# Patient Record
Sex: Female | Born: 1955 | Hispanic: No | Marital: Single | Smoking: Never smoker
Health system: Southern US, Community
[De-identification: ages and names within clinical notes are randomized; demographics above are authoritative.]

## PROBLEM LIST (undated history)

## (undated) DIAGNOSIS — F32A Depression, unspecified: Secondary | ICD-10-CM

## (undated) DIAGNOSIS — K222 Esophageal obstruction: Secondary | ICD-10-CM

## (undated) DIAGNOSIS — S0990XA Unspecified injury of head, initial encounter: Secondary | ICD-10-CM

## (undated) DIAGNOSIS — T7840XA Allergy, unspecified, initial encounter: Secondary | ICD-10-CM

## (undated) DIAGNOSIS — I2510 Atherosclerotic heart disease of native coronary artery without angina pectoris: Secondary | ICD-10-CM

## (undated) DIAGNOSIS — J449 Chronic obstructive pulmonary disease, unspecified: Secondary | ICD-10-CM

## (undated) DIAGNOSIS — J189 Pneumonia, unspecified organism: Secondary | ICD-10-CM

## (undated) DIAGNOSIS — Z87442 Personal history of urinary calculi: Secondary | ICD-10-CM

## (undated) DIAGNOSIS — I499 Cardiac arrhythmia, unspecified: Secondary | ICD-10-CM

## (undated) DIAGNOSIS — I209 Angina pectoris, unspecified: Secondary | ICD-10-CM

## (undated) DIAGNOSIS — K219 Gastro-esophageal reflux disease without esophagitis: Secondary | ICD-10-CM

## (undated) DIAGNOSIS — M4310 Spondylolisthesis, site unspecified: Secondary | ICD-10-CM

## (undated) DIAGNOSIS — B009 Herpesviral infection, unspecified: Secondary | ICD-10-CM

## (undated) DIAGNOSIS — F329 Major depressive disorder, single episode, unspecified: Secondary | ICD-10-CM

## (undated) DIAGNOSIS — I509 Heart failure, unspecified: Secondary | ICD-10-CM

## (undated) DIAGNOSIS — E785 Hyperlipidemia, unspecified: Secondary | ICD-10-CM

## (undated) DIAGNOSIS — Z9889 Other specified postprocedural states: Secondary | ICD-10-CM

## (undated) DIAGNOSIS — R569 Unspecified convulsions: Secondary | ICD-10-CM

## (undated) DIAGNOSIS — M1990 Unspecified osteoarthritis, unspecified site: Secondary | ICD-10-CM

## (undated) DIAGNOSIS — E119 Type 2 diabetes mellitus without complications: Secondary | ICD-10-CM

## (undated) DIAGNOSIS — F319 Bipolar disorder, unspecified: Secondary | ICD-10-CM

## (undated) DIAGNOSIS — N390 Urinary tract infection, site not specified: Secondary | ICD-10-CM

## (undated) DIAGNOSIS — I10 Essential (primary) hypertension: Secondary | ICD-10-CM

## (undated) DIAGNOSIS — M48061 Spinal stenosis, lumbar region without neurogenic claudication: Secondary | ICD-10-CM

## (undated) DIAGNOSIS — Z8719 Personal history of other diseases of the digestive system: Secondary | ICD-10-CM

## (undated) DIAGNOSIS — Z5189 Encounter for other specified aftercare: Secondary | ICD-10-CM

## (undated) DIAGNOSIS — R0602 Shortness of breath: Secondary | ICD-10-CM

## (undated) DIAGNOSIS — F419 Anxiety disorder, unspecified: Secondary | ICD-10-CM

## (undated) DIAGNOSIS — D649 Anemia, unspecified: Secondary | ICD-10-CM

## (undated) DIAGNOSIS — H269 Unspecified cataract: Secondary | ICD-10-CM

## (undated) DIAGNOSIS — I471 Supraventricular tachycardia, unspecified: Secondary | ICD-10-CM

## (undated) DIAGNOSIS — Z95818 Presence of other cardiac implants and grafts: Secondary | ICD-10-CM

## (undated) DIAGNOSIS — I219 Acute myocardial infarction, unspecified: Secondary | ICD-10-CM

## (undated) DIAGNOSIS — R55 Syncope and collapse: Secondary | ICD-10-CM

## (undated) DIAGNOSIS — R519 Headache, unspecified: Secondary | ICD-10-CM

## (undated) DIAGNOSIS — G4730 Sleep apnea, unspecified: Secondary | ICD-10-CM

## (undated) DIAGNOSIS — M797 Fibromyalgia: Secondary | ICD-10-CM

## (undated) DIAGNOSIS — G4700 Insomnia, unspecified: Secondary | ICD-10-CM

## (undated) HISTORY — DX: Hyperlipidemia, unspecified: E78.5

## (undated) HISTORY — DX: Fibromyalgia: M79.7

## (undated) HISTORY — PX: EYE SURGERY: SHX253

## (undated) HISTORY — DX: Pneumonia, unspecified organism: J18.9

## (undated) HISTORY — DX: Anxiety disorder, unspecified: F41.9

## (undated) HISTORY — DX: Atherosclerotic heart disease of native coronary artery without angina pectoris: I25.10

## (undated) HISTORY — DX: Encounter for other specified aftercare: Z51.89

## (undated) HISTORY — PX: BACK SURGERY: SHX140

## (undated) HISTORY — PX: UPPER GASTROINTESTINAL ENDOSCOPY: SHX188

## (undated) HISTORY — DX: Essential (primary) hypertension: I10

## (undated) HISTORY — PX: HERNIA REPAIR: SHX51

## (undated) HISTORY — DX: Esophageal obstruction: K22.2

## (undated) HISTORY — DX: Unspecified cataract: H26.9

## (undated) HISTORY — DX: Herpesviral infection, unspecified: B00.9

## (undated) HISTORY — DX: Insomnia, unspecified: G47.00

## (undated) HISTORY — DX: Depression, unspecified: F32.A

## (undated) HISTORY — DX: Sleep apnea, unspecified: G47.30

## (undated) HISTORY — DX: Spondylolisthesis, site unspecified: M43.10

## (undated) HISTORY — PX: BREAST SURGERY: SHX581

## (undated) HISTORY — PX: DIAGNOSTIC LAPAROSCOPY: SUR761

## (undated) HISTORY — DX: Unspecified osteoarthritis, unspecified site: M19.90

## (undated) HISTORY — PX: HEMORRHOID SURGERY: SHX153

## (undated) HISTORY — PX: CHOLECYSTECTOMY: SHX55

## (undated) HISTORY — DX: Syncope and collapse: R55

## (undated) HISTORY — PX: TUBAL LIGATION: SHX77

## (undated) HISTORY — DX: Unspecified injury of head, initial encounter: S09.90XA

## (undated) HISTORY — DX: Bipolar disorder, unspecified: F31.9

## (undated) HISTORY — PX: CYSTOSCOPY: SUR368

## (undated) HISTORY — DX: Allergy, unspecified, initial encounter: T78.40XA

## (undated) HISTORY — DX: Major depressive disorder, single episode, unspecified: F32.9

## (undated) HISTORY — DX: Supraventricular tachycardia, unspecified: I47.10

## (undated) HISTORY — DX: Supraventricular tachycardia: I47.1

## (undated) HISTORY — DX: Spinal stenosis, lumbar region without neurogenic claudication: M48.061

## (undated) HISTORY — PX: BREAST EXCISIONAL BIOPSY: SUR124

## (undated) HISTORY — DX: Gastro-esophageal reflux disease without esophagitis: K21.9

---

## 2003-04-29 ENCOUNTER — Emergency Department (HOSPITAL_COMMUNITY): Admission: EM | Admit: 2003-04-29 | Discharge: 2003-04-29 | Payer: Self-pay | Admitting: Emergency Medicine

## 2004-03-26 ENCOUNTER — Other Ambulatory Visit: Admission: RE | Admit: 2004-03-26 | Discharge: 2004-03-26 | Payer: Self-pay | Admitting: Family Medicine

## 2006-02-23 ENCOUNTER — Encounter: Admission: RE | Admit: 2006-02-23 | Discharge: 2006-03-16 | Payer: Self-pay | Admitting: Orthopedic Surgery

## 2007-04-01 HISTORY — PX: DOPPLER ECHOCARDIOGRAPHY: SHX263

## 2007-04-09 ENCOUNTER — Ambulatory Visit: Payer: Self-pay | Admitting: Internal Medicine

## 2007-04-22 ENCOUNTER — Ambulatory Visit: Payer: Self-pay

## 2007-04-22 ENCOUNTER — Encounter: Payer: Self-pay | Admitting: Internal Medicine

## 2007-04-22 LAB — CONVERTED CEMR LAB
Cholesterol: 206 mg/dL (ref 0–200)
Direct LDL: 142.1 mg/dL
HDL: 48.8 mg/dL (ref >39.0)
Triglycerides: 101 mg/dL (ref 0–149)

## 2007-06-15 ENCOUNTER — Ambulatory Visit (HOSPITAL_COMMUNITY): Admission: RE | Admit: 2007-06-15 | Discharge: 2007-06-15 | Payer: Self-pay | Admitting: Internal Medicine

## 2007-06-15 ENCOUNTER — Ambulatory Visit: Payer: Self-pay | Admitting: Internal Medicine

## 2007-06-15 HISTORY — PX: OTHER SURGICAL HISTORY: SHX169

## 2007-07-30 ENCOUNTER — Ambulatory Visit: Payer: Self-pay | Admitting: Internal Medicine

## 2007-07-30 LAB — CONVERTED CEMR LAB
BUN: 13 mg/dL (ref 6–23)
Calcium: 9.1 mg/dL (ref 8.4–10.5)
Chloride: 105 meq/L (ref 96–112)
Creatinine, Ser: 0.8 mg/dL (ref 0.4–1.2)
Eosinophils Absolute: 0.1 10*3/uL (ref 0.0–0.7)
Eosinophils Relative: 2.4 % (ref 0.0–5.0)
GFR calc Af Amer: 98 mL/min
Glucose, Bld: 93 mg/dL (ref 70–99)
MCHC: 33.5 g/dL (ref 30.0–36.0)
MCV: 89 fL (ref 78.0–100.0)
Monocytes Absolute: 0.5 10*3/uL (ref 0.1–1.0)
Monocytes Relative: 8.3 % (ref 3.0–12.0)
Neutro Abs: 2.8 10*3/uL (ref 1.4–7.7)
Potassium: 4.5 meq/L (ref 3.5–5.1)
Prothrombin Time: 11.7 s (ref 10.9–13.3)
RDW: 13.3 % (ref 11.5–14.6)
WBC: 5.5 10*3/uL (ref 4.5–10.5)

## 2007-08-11 ENCOUNTER — Ambulatory Visit (HOSPITAL_COMMUNITY): Admission: RE | Admit: 2007-08-11 | Discharge: 2007-08-11 | Payer: Self-pay | Admitting: Internal Medicine

## 2007-08-11 ENCOUNTER — Ambulatory Visit: Payer: Self-pay | Admitting: Internal Medicine

## 2007-08-11 HISTORY — PX: OTHER SURGICAL HISTORY: SHX169

## 2007-08-26 ENCOUNTER — Ambulatory Visit: Payer: Self-pay

## 2007-09-05 ENCOUNTER — Ambulatory Visit (HOSPITAL_BASED_OUTPATIENT_CLINIC_OR_DEPARTMENT_OTHER): Admission: RE | Admit: 2007-09-05 | Discharge: 2007-09-05 | Payer: Self-pay | Admitting: Neurology

## 2007-09-20 ENCOUNTER — Ambulatory Visit: Payer: Self-pay | Admitting: Pulmonary Disease

## 2007-11-19 ENCOUNTER — Ambulatory Visit: Payer: Self-pay | Admitting: Internal Medicine

## 2008-01-05 ENCOUNTER — Ambulatory Visit: Payer: Self-pay | Admitting: Internal Medicine

## 2008-01-10 ENCOUNTER — Encounter: Admission: RE | Admit: 2008-01-10 | Discharge: 2008-01-10 | Payer: Self-pay | Admitting: Specialist

## 2008-01-20 ENCOUNTER — Encounter: Admission: RE | Admit: 2008-01-20 | Discharge: 2008-03-08 | Payer: Self-pay | Admitting: Specialist

## 2008-05-17 ENCOUNTER — Ambulatory Visit: Payer: Self-pay | Admitting: Internal Medicine

## 2008-05-22 ENCOUNTER — Ambulatory Visit: Payer: Self-pay

## 2008-06-15 ENCOUNTER — Inpatient Hospital Stay (HOSPITAL_COMMUNITY): Admission: RE | Admit: 2008-06-15 | Discharge: 2008-06-19 | Payer: Self-pay | Admitting: Specialist

## 2008-10-31 DIAGNOSIS — F32A Depression, unspecified: Secondary | ICD-10-CM | POA: Insufficient documentation

## 2008-10-31 DIAGNOSIS — M48061 Spinal stenosis, lumbar region without neurogenic claudication: Secondary | ICD-10-CM | POA: Insufficient documentation

## 2008-10-31 DIAGNOSIS — S0990XA Unspecified injury of head, initial encounter: Secondary | ICD-10-CM | POA: Insufficient documentation

## 2008-10-31 DIAGNOSIS — I10 Essential (primary) hypertension: Secondary | ICD-10-CM | POA: Insufficient documentation

## 2008-10-31 DIAGNOSIS — F329 Major depressive disorder, single episode, unspecified: Secondary | ICD-10-CM

## 2008-10-31 DIAGNOSIS — B009 Herpesviral infection, unspecified: Secondary | ICD-10-CM | POA: Insufficient documentation

## 2008-10-31 DIAGNOSIS — E785 Hyperlipidemia, unspecified: Secondary | ICD-10-CM | POA: Insufficient documentation

## 2008-10-31 DIAGNOSIS — M791 Myalgia, unspecified site: Secondary | ICD-10-CM | POA: Insufficient documentation

## 2008-10-31 DIAGNOSIS — M810 Age-related osteoporosis without current pathological fracture: Secondary | ICD-10-CM | POA: Insufficient documentation

## 2008-10-31 DIAGNOSIS — G4730 Sleep apnea, unspecified: Secondary | ICD-10-CM | POA: Insufficient documentation

## 2008-10-31 DIAGNOSIS — K219 Gastro-esophageal reflux disease without esophagitis: Secondary | ICD-10-CM | POA: Insufficient documentation

## 2008-10-31 DIAGNOSIS — Q762 Congenital spondylolisthesis: Secondary | ICD-10-CM | POA: Insufficient documentation

## 2008-10-31 DIAGNOSIS — J45909 Unspecified asthma, uncomplicated: Secondary | ICD-10-CM | POA: Insufficient documentation

## 2008-10-31 DIAGNOSIS — R55 Syncope and collapse: Secondary | ICD-10-CM | POA: Insufficient documentation

## 2008-10-31 DIAGNOSIS — J189 Pneumonia, unspecified organism: Secondary | ICD-10-CM | POA: Insufficient documentation

## 2008-11-09 ENCOUNTER — Ambulatory Visit: Payer: Self-pay | Admitting: Internal Medicine

## 2009-02-12 ENCOUNTER — Ambulatory Visit: Payer: Self-pay

## 2009-05-03 ENCOUNTER — Encounter: Admission: RE | Admit: 2009-05-03 | Discharge: 2009-08-01 | Payer: Self-pay | Admitting: Specialist

## 2009-05-15 ENCOUNTER — Ambulatory Visit: Payer: Self-pay | Admitting: Internal Medicine

## 2009-06-04 ENCOUNTER — Encounter: Admission: RE | Admit: 2009-06-04 | Discharge: 2009-06-04 | Payer: Self-pay | Admitting: Specialist

## 2009-07-11 ENCOUNTER — Telehealth (INDEPENDENT_AMBULATORY_CARE_PROVIDER_SITE_OTHER): Payer: Self-pay | Admitting: *Deleted

## 2009-07-23 ENCOUNTER — Telehealth (INDEPENDENT_AMBULATORY_CARE_PROVIDER_SITE_OTHER): Payer: Self-pay | Admitting: *Deleted

## 2009-08-07 ENCOUNTER — Encounter: Admission: RE | Admit: 2009-08-07 | Discharge: 2009-11-05 | Payer: Self-pay | Admitting: Specialist

## 2009-11-06 ENCOUNTER — Ambulatory Visit: Payer: Self-pay | Admitting: Internal Medicine

## 2010-03-06 ENCOUNTER — Encounter
Admission: RE | Admit: 2010-03-06 | Discharge: 2010-03-06 | Payer: Self-pay | Source: Home / Self Care | Admitting: Specialist

## 2010-04-21 ENCOUNTER — Encounter: Payer: Self-pay | Admitting: Family Medicine

## 2010-04-30 NOTE — Progress Notes (Signed)
Summary: Records request from [facility]  Request for records received from [facility]. Request forwarded to [facility]. Dena Chavis  July 23, 2009 11:13 AM

## 2010-04-30 NOTE — Assessment & Plan Note (Signed)
Summary: device/saf  Medications Added TRAZODONE HCL 100 MG TABS (TRAZODONE HCL) 2 tabs once daily LIPITOR 40 MG TABS (ATORVASTATIN CALCIUM) Take one tablet by mouth daily.      Allergies Added:   Visit Type:  Follow-up Primary Provider:  Dr. Lindaann Pascal   History of Present Illness: Ms. Nuccio returns today for followup.   She has a h/o unexplained syncope and is s/p ILR insertion.  The patient denies c/p but does note some increased stress as she is in the process of moving.  She had one syncopal episode since we last saw her.  The patient has not had any sob. No peripheral edema.  Current Medications (verified): 1)  Omeprazole 40 Mg Cpdr (Omeprazole) .... Two Times A Day 2)  Atenolol 25 Mg Tabs (Atenolol) .... Take One Tablet By Mouth Two Times A Day and As Directed For Palpations 3)  Cymbalta 60 Mg Cpep (Duloxetine Hcl) .... Once Daily 4)  Voltaren 1 % Gel (Diclofenac Sodium) .... As Needed 5)  Nitrolingual 0.4 Mg/spray Soln (Nitroglycerin) .... Prn 6)  Proair Hfa 108 (90 Base) Mcg/act Aers (Albuterol Sulfate) .... As Needed 7)  Xanax 1 Mg Tabs (Alprazolam) .... Two Times A Day 8)  Abilify 10 Mg Tabs (Aripiprazole) .... Once Daily 9)  Hydrochlorothiazide 12.5 Mg Tabs (Hydrochlorothiazide) .... Once Daily 10)  Valtrex 500 Mg Tabs (Valacyclovir Hcl) .... Two Times A Day 11)  Trazodone Hcl 100 Mg Tabs (Trazodone Hcl) .... 2 Tabs Once Daily 12)  Zovirax 5 % Oint (Acyclovir) .... Prn 13)  Lipitor 40 Mg Tabs (Atorvastatin Calcium) .... Take One Tablet By Mouth Daily.  Allergies (verified): 1)  ! 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 of  SUPRAVENTRICULAR TACHYCARDIA (ICD-427.89) SYNCOPE (ICD-780.2) CAD (ICD-414.00) SPONDYLOLISTHESIS (ICD-756.12) SPINAL STENOSIS OF LUMBAR REGION (ICD-724.02)  Past Surgical History: Last updated: 10/31/2008 Insertion of implantable loop recorder  08/11/2007  Doylene Canning. Ladona Ridgel, MD     Head-up tilt table testing.   06/15/2007  Doylene Canning. Ladona Ridgel, MD   Back Surgery  Review of Systems       The patient complains of weight gain and syncope.  The patient denies chest pain, dyspnea on exertion, and peripheral edema.    Vital Signs:  Patient profile:   55 year old female Height:      61 inches Weight:      144 pounds Pulse rate:   64 / minute BP sitting:   118 / 80  (left arm)  Vitals Entered By: Laurance Flatten CMA (November 06, 2009 2:30 PM)  Physical Exam  General:  Well developed, well nourished, in no acute distress. Head:  normocephalic and atraumatic Eyes:  PERRLA/EOM intact; conjunctiva and lids normal. Mouth:  Teeth, gums and palate normal. Oral mucosa normal. Neck:  Neck supple, no JVD. No masses, thyromegaly or abnormal cervical nodes. Chest Wall:  Well healed ILR incision. Lungs:  Clear bilaterally to auscultation. No wheezes, rales, or rhonchi. Heart:  RRR with normal S1 and S2.  PMI is not enlarged or laterally displaced. Abdomen:  Bowel sounds positive; abdomen soft and non-tender without masses, organomegaly, or hernias noted. No hepatosplenomegaly. Msk:  Back normal, normal gait. Muscle strength and tone normal. Pulses:  pulses normal in all 4 extremities Extremities:  No clubbing or cyanosis. Neurologic:  Alert and oriented x 3.    ILR Following MD [name], MD DOI:  08/11/2007 Vendor:  [employer]     Model Number:  [id number]     Serial Number [id number]       Tachy Episodes:  12     Brady Episodes:  9 ILR Next Due 02/05/2010  Tech Comments:  All tachy episodes T wave oversensing, brady/asystole episodes undersensing.   ROV 3 months clinic. Altha Harm, LPN  November 06, 2009 2:46 PM   MD Comments:  Agree with above.  Impression & Recommendations:  Problem # 1:  SYNCOPE (ICD-780.2) Her symptoms appear to be well controlled.  She has one episode of syncope not associated to bracycardia or tachycardia.  Will follow her ILR. Her updated medication list for this problem includes:    Atenolol 25 Mg Tabs (Atenolol) .[name]... Take one tablet by mouth two times a day and as directed for palpations    Nitrolingual 0.4 Mg/spray Soln (Nitroglycerin) .[name]... Prn  Problem # 2:  HYPERTENSION (ICD-401.9) She will continue her current meds.  A low sodium diet is requested. The following medications were removed from the medication list:    Caduet 5-40 Mg Tabs (Amlodipine-atorvastatin) ..... Once daily Her updated medication list for this problem includes:    Atenolol 25 Mg Tabs (Atenolol) .[name]... Take one tablet by mouth two times a day and as directed for palpations    Hydrochlorothiazide 12.5 Mg Tabs (Hydrochlorothiazide) ..... Once daily  Patient Instructions: 1)  Your physician recommends that you schedule a follow-up appointment in: 6 months with device clinic and 12 months with Dr Ladona Ridgel

## 2010-04-30 NOTE — Progress Notes (Signed)
   Request recieved from [employer] sent to [facility]  July 11, 2009 9:56 AM

## 2010-04-30 NOTE — Assessment & Plan Note (Signed)
Summary: pc2  Medications Added OMEPRAZOLE 40 MG CPDR (OMEPRAZOLE) two times a day ATENOLOL 25 MG TABS (ATENOLOL) Take one tablet by mouth two times a day ATENOLOL 25 MG TABS (ATENOLOL) Take one tablet by mouth two times a day and as directed for palpations CYMBALTA 60 MG CPEP (DULOXETINE HCL) once daily VALTREX 500 MG TABS (VALACYCLOVIR HCL) two times a day ETODOLAC 400 MG TABS (ETODOLAC) two times a day PENTAZOCINE-APAP 25-650 MG TABS (PENTAZOCINE-APAP) every 6 hrs      Allergies Added:   Primary Provider:  Dr. Lorin Picket Long  CC:  2/5 had episode -- felt like heart was gonna "explode" .  History of Present Illness: [name] returns today for followup.   She has a h/o unexplained syncope and is s/p ILR insertion.  The patient denies c/p but does note that she had an episode while at the movies where it felt like her heart would beat out of her chest.  The patient has not had any syncope or sob. No peripheral edema.  Current Medications (verified): 1)  Omeprazole 40 Mg Cpdr (Omeprazole) .... Two Times A Day 2)  Atenolol 25 Mg Tabs (Atenolol) .... Take One Tablet By Mouth Two Times A Day 3)  Cymbalta 60 Mg Cpep (Duloxetine Hcl) .... Once Daily 4)  Voltaren 1 % Gel (Diclofenac Sodium) .... As Needed 5)  Nitrolingual 0.4 Mg/spray Soln (Nitroglycerin) .... Prn 6)  Proair Hfa 108 (90 Base) Mcg/act Aers (Albuterol Sulfate) .... As Needed 7)  Xanax 1 Mg Tabs (Alprazolam) .... Two Times A Day 8)  Abilify 10 Mg Tabs (Aripiprazole) .... Once Daily 9)  Hydrochlorothiazide 12.5 Mg Tabs (Hydrochlorothiazide) .... Once Daily 10)  Valtrex 500 Mg Tabs (Valacyclovir Hcl) .... Two Times A Day 11)  Caduet 5-40 Mg Tabs (Amlodipine-Atorvastatin) .... Once Daily 12)  Zetia 10 Mg Tabs (Ezetimibe) .... Qd 13)  Trazodone Hcl 50 Mg Tabs (Trazodone Hcl) .... Once Daily 14)  Zovirax 5 % Oint (Acyclovir) .... Prn 15)  Baclofen 10 Mg Tabs (Baclofen) .... Three Times A Day 16)  Etodolac 400 Mg Tabs (Etodolac)  .... Two Times A Day 17)  Pentazocine-Apap 25-650 Mg Tabs (Pentazocine-Apap) .... Every 6 Hrs  Allergies (verified): 1)  ! 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 of SUPRAVENTRICULAR TACHYCARDIA (ICD-427.89) SYNCOPE (ICD-780.2) CAD (ICD-414.00) SPONDYLOLISTHESIS (ICD-756.12) SPINAL STENOSIS OF LUMBAR REGION (ICD-724.02)  Past Surgical History: Last updated: 10/31/2008 Insertion of implantable loop recorder  08/11/2007  [name]. Erica Ridgel, MD     Head-up tilt table testing.   06/15/2007  [name]. Erica Ridgel, MD   Back Surgery  Review of Systems       The patient complains of chest pain.  The patient denies syncope, dyspnea on exertion, and peripheral edema.    Vital Signs:  Patient profile:   55 year old female Height:      61 inches Weight:      136 pounds BMI:     25.79 Pulse rate:   73 / minute BP sitting:   130 / 83  (left arm) Cuff size:   regular  Vitals Entered By: Hardin Negus, RMA (May 15, 2009 2:49 PM)  Physical Exam  General:  Well developed, well nourished, in no acute distress. Head:  normocephalic and atraumatic Eyes:  PERRLA/EOM intact; conjunctiva  and lids normal. Mouth:  Teeth, gums and palate normal. Oral mucosa normal. Neck:  Neck supple, no JVD. No masses, thyromegaly or abnormal cervical nodes. Chest Wall:  Well healed ILR incision. Lungs:  Clear bilaterally to auscultation. No wheezes, rales, or rhonchi. Heart:  RRR with normal S1 and S2.  PMI is not enlarged or laterally displaced. Abdomen:  Bowel sounds positive; abdomen soft and non-tender without masses, organomegaly, or hernias noted. No  hepatosplenomegaly. Msk:  Back normal, normal gait. Muscle strength and tone normal. Pulses:  pulses normal in all 4 extremities Extremities:  No clubbing or cyanosis. Neurologic:  Alert and oriented x 3.    ILR Following MD [name], MD DOI:  08/11/2007 Vendor:  [employer]     Model Number:  [id number]     Serial Number [id number]       Tachy Episodes:  3      Tech Comments:  FVT and VT episodes sinus tachy (3), all other oversensing episodes.  0 asystole episodes.  Checked by [employer]. Altha Harm, LPN  May 15, 2009 3:25 PM   MD Comments:  Agree with above.  Impression & Recommendations:  Problem # 1:  SYNCOPE (ICD-780.2) No recurrent episodes.  Will followup. The following medications were removed from the medication list:    Corgard 20 Mg Tabs (Nadolol) Her updated medication list for this problem includes:    Atenolol 25 Mg Tabs (Atenolol) .[name]... Take one tablet by mouth two times a day and as directed for palpations    Nitrolingual 0.4 Mg/spray Soln (Nitroglycerin) .[name]... Prn  Problem # 2:  ? of SUPRAVENTRICULAR TACHYCARDIA (ICD-427.89) The patient has had recurrent palpitations. The etiology is unclear. I have recommended that she take an additional beta blocker for recurrent symptoms and activate her ILR. The following medications were removed from the medication list:    Corgard 20 Mg Tabs (Nadolol) Her updated medication list for this problem includes:    Atenolol 25 Mg Tabs (Atenolol) .[name]... Take one tablet by mouth two times a day and as directed for palpations    Nitrolingual 0.4 Mg/spray Soln (Nitroglycerin) .[name]... Prn  Problem # 3:  HYPERTENSION (ICD-401.9) continue current meds and a low sodium diet. The following medications were removed from the medication list:    Corgard 20 Mg Tabs (Nadolol) Her updated medication list for this problem includes:    Atenolol 25 Mg Tabs (Atenolol) .[name]... Take one tablet by mouth two times a day and as directed for  palpations    Hydrochlorothiazide 12.5 Mg Tabs (Hydrochlorothiazide) ..... Once daily    Caduet 5-40 Mg Tabs (Amlodipine-atorvastatin) ..... Once daily  Patient Instructions: 1)  Your physician recommends that you schedule a follow-up appointment in: 6 months with Dr [name] Prescriptions: ATENOLOL 25 MG TABS (ATENOLOL) Take one tablet by mouth two times a day and as directed for palpations  #60 x 11   Entered by:   Dennis Bast, RN, BSN   Authorized by:   Laren Boom, MD, [facility]   Signed by:   Dennis Bast, RN, BSN on 05/15/2009   Method used:   Electronically to        [facility]* (retail)       [address]       [place], [place]  [address]       Ph: [phone]       Fax: [phone]   RxID:   [id number]

## 2010-05-01 ENCOUNTER — Ambulatory Visit: Admit: 2010-05-01 | Payer: Self-pay | Admitting: Internal Medicine

## 2010-05-13 ENCOUNTER — Encounter: Payer: Self-pay | Admitting: Internal Medicine

## 2010-05-13 ENCOUNTER — Encounter (INDEPENDENT_AMBULATORY_CARE_PROVIDER_SITE_OTHER): Payer: Medicare Other

## 2010-05-13 DIAGNOSIS — R55 Syncope and collapse: Secondary | ICD-10-CM

## 2010-05-22 NOTE — Procedures (Signed)
Summary: device/sf/glc  Medications Added XANAX 1 MG TABS (ALPRAZOLAM) one by mouth three times a day      Allergies Added:   Current Medications (verified): 1)  Omeprazole 40 Mg Cpdr (Omeprazole) .... Two Times A Day 2)  Atenolol 25 Mg Tabs (Atenolol) .... Take One Tablet By Mouth Two Times A Day and As Directed For Palpations 3)  Cymbalta 60 Mg Cpep (Duloxetine Hcl) .... Once Daily 4)  Voltaren 1 % Gel (Diclofenac Sodium) .... As Needed 5)  Nitrolingual 0.4 Mg/spray Soln (Nitroglycerin) .... Prn 6)  Proair Hfa 108 (90 Base) Mcg/act Aers (Albuterol Sulfate) .... As Needed 7)  Xanax 1 Mg Tabs (Alprazolam) .... One By Mouth Three Times A Day 8)  Abilify 10 Mg Tabs (Aripiprazole) .... Once Daily 9)  Hydrochlorothiazide 12.5 Mg Tabs (Hydrochlorothiazide) .... Once Daily 10)  Valtrex 500 Mg Tabs (Valacyclovir Hcl) .... Two Times A Day 11)  Trazodone Hcl 100 Mg Tabs (Trazodone Hcl) .... 2 Tabs Once Daily 12)  Zovirax 5 % Oint (Acyclovir) .... Prn 13)  Lipitor 40 Mg Tabs (Atorvastatin Calcium) .... Take One Tablet By Mouth Daily.  Allergies (verified): 1)  ! Darlyne Russian   ILR Following MD [name], MD DOI:  08/11/2007 Vendor:  [employer]     Model Number:  1610     Serial Number [id number]       Tachy Episodes:  42      Tech Comments:  7 asystole episodes were undersensing. 2 episodes noted on 1/28 while the patient was walking she states" her lips and fingernails were blue."  Episodes show ST/SVT rate 170bpm.  Patient was instructed to make note of any further episodes and if they are proloned to go to the ER per Dr. Ladona Ridgel.  Otherwise we will see her back in 3 months.  All other episodes were T-wave oversensing. Altha Harm, LPN  May 13, 2010 10:10 AM

## 2010-07-11 LAB — COMPREHENSIVE METABOLIC PANEL
AST: 25 U/L (ref 0–37)
Albumin: 4.1 g/dL (ref 3.5–5.2)
BUN: 15 mg/dL (ref 6–23)
Chloride: 107 mEq/L (ref 96–112)
Creatinine, Ser: 0.89 mg/dL (ref 0.4–1.2)
GFR calc Af Amer: >60 mL/min (ref >60)
Potassium: 3.8 mEq/L (ref 3.5–5.1)
Total Protein: 6.8 g/dL (ref 6.0–8.3)

## 2010-07-11 LAB — CULTURE, BLOOD (ROUTINE X 2)

## 2010-07-11 LAB — DIFFERENTIAL
Basophils Absolute: 0 10*3/uL (ref 0.0–0.1)
Basophils Relative: 0 % (ref 0–1)
Eosinophils Absolute: 0.1 10*3/uL (ref 0.0–0.7)
Eosinophils Relative: 2 % (ref 0–5)
Lymphocytes Relative: 42 % (ref 12–46)
Lymphs Abs: 0.9 10*3/uL (ref 0.7–4.0)
Monocytes Absolute: 0.5 10*3/uL (ref 0.1–1.0)
Monocytes Relative: 7 % (ref 3–12)
Neutro Abs: 6.8 10*3/uL (ref 1.7–7.7)
Neutro Abs: 3.2 10*3/uL (ref 1.7–7.7)
Neutrophils Relative %: 79 % — ABNORMAL HIGH (ref 43–77)

## 2010-07-11 LAB — BASIC METABOLIC PANEL
BUN: 5 mg/dL — ABNORMAL LOW (ref 6–23)
BUN: 8 mg/dL (ref 6–23)
CO2: 28 mEq/L (ref 19–32)
CO2: 31 mEq/L (ref 19–32)
Calcium: 8.3 mg/dL — ABNORMAL LOW (ref 8.4–10.5)
Chloride: 102 mEq/L (ref 96–112)
Chloride: 103 mEq/L (ref 96–112)
Chloride: 104 mEq/L (ref 96–112)
Creatinine, Ser: 0.77 mg/dL (ref 0.4–1.2)
GFR calc Af Amer: >60 mL/min (ref >60)
GFR calc non Af Amer: >60 mL/min (ref >60)
GFR calc non Af Amer: >60 mL/min (ref >60)
Glucose, Bld: 92 mg/dL (ref 70–99)
Potassium: 3.9 mEq/L (ref 3.5–5.1)
Potassium: 4.2 mEq/L (ref 3.5–5.1)
Sodium: 136 mEq/L (ref 135–145)

## 2010-07-11 LAB — CBC
HCT: 28.4 % — ABNORMAL LOW (ref 36.0–46.0)
HCT: 44.7 % (ref 36.0–46.0)
MCHC: 34.5 g/dL (ref 30.0–36.0)
MCHC: 34.7 g/dL (ref 30.0–36.0)
MCHC: 34.7 g/dL (ref 30.0–36.0)
MCV: 91.3 fL (ref 78.0–100.0)
MCV: 92.3 fL (ref 78.0–100.0)
Platelets: 105 10*3/uL — ABNORMAL LOW (ref 150–400)
Platelets: 151 10*3/uL (ref 150–400)
Platelets: 95 10*3/uL — ABNORMAL LOW (ref 150–400)
Platelets: 200 10*3/uL (ref 150–400)
RBC: 2.86 MIL/uL — ABNORMAL LOW (ref 3.87–5.11)
RDW: 12.2 % (ref 11.5–15.5)
RDW: 12.4 % (ref 11.5–15.5)
RDW: 12.6 % (ref 11.5–15.5)
WBC: 5.5 10*3/uL (ref 4.0–10.5)
WBC: 6.4 10*3/uL (ref 4.0–10.5)
WBC: 6.5 10*3/uL (ref 4.0–10.5)

## 2010-07-11 LAB — URINALYSIS, ROUTINE W REFLEX MICROSCOPIC
Glucose, UA: NEGATIVE mg/dL
Nitrite: NEGATIVE
Specific Gravity, Urine: 1.024 (ref 1.005–1.030)
pH: 5 (ref 5.0–8.0)

## 2010-07-11 LAB — TYPE AND SCREEN
ABO/RH(D): A POS
Antibody Screen: NEGATIVE

## 2010-07-11 LAB — HEMOGLOBIN AND HEMATOCRIT, BLOOD
HCT: 30.5 % — ABNORMAL LOW (ref 36.0–46.0)
Hemoglobin: 10.5 g/dL — ABNORMAL LOW (ref 12.0–15.0)
Hemoglobin: 10.6 g/dL — ABNORMAL LOW (ref 12.0–15.0)

## 2010-07-11 LAB — APTT: aPTT: 28 seconds (ref 24–37)

## 2010-08-12 ENCOUNTER — Ambulatory Visit (INDEPENDENT_AMBULATORY_CARE_PROVIDER_SITE_OTHER): Payer: Medicare Other | Admitting: *Deleted

## 2010-08-12 DIAGNOSIS — R55 Syncope and collapse: Secondary | ICD-10-CM

## 2010-08-13 NOTE — Assessment & Plan Note (Signed)
[facility]                         ELECTROPHYSIOLOGY OFFICE NOTE   NAME:RICHEYBijal, Siglin            MRN:          [id number]  DATE:05/17/2008                            DOB:          September 21, 1955    Ms. Desmith returns today for followup.  She is a very pleasant middle-  aged woman with a history of syncope and sinus tachycardia.  She is  status post insertion of implantable loop recorder.  She has a history  of severe arthritis and lower back pain.  She has palpitations which are  fairly quiet.  The patient states today that she is considering the  upcoming back surgery for a problem with her lumbar disk.  She states  that in the last several months she has had experienced episodes of  chest discomfort, sometimes with exertion, sometimes rest.  Sometimes  her symptoms involve her left arm.  There was no associated nausea or  vomiting.  She is, otherwise, stable.   MEDICATIONS:  1. Zegerid 40/1100 b.i.d.  2. Atenolol 25 a day.  3. Cymbalta 60 a day.  4. Voltaren gel.  5. Kapidex 60 a day.  6. __________ 375 twice daily.   PHYSICAL EXAMINATION:  GENERAL:  She is a pleasant middle-aged woman in  no distress.  VITAL SIGNS:  Blood pressure was 123/89, pulse 73 and regular,  respirations were 18, weight was 140 pounds.  NECK:  No jugular distention.  LUNGS:  Clear bilaterally on auscultation.  No wheezes, rales, or  rhonchi are present.  CARDIAC:  Regular rate and rhythm.  Normal S1 and S2.  ABDOMEN:  Soft and nontender.  EXTREMITIES:  No edema.   Interrogation of her implantable loop recorder demonstrates episodes of  what appear to be sinus tachycardia, though I cannot exclude atrial tach  or SVT.  There is also some oversensing present.   IMPRESSION:  1. History of syncope with no obvious etiology.  2. Status post loop recorder with documented sinus tachycardia,      persistent atrial tachycardia which appears to be asymptomatic  and      does not correlate with her spells.  3. Severe lumbar back pain.  4. Chest pain, both typical and atypical in its nature.   DISCUSSION:  The patient is strongly considering undergoing lumbar back  surgery.  Because of her chest pain, I think it is important that we  evaluate her for occult ischemia.  Normally, I would recommend a regular  exercise treadmill test to evaluate this.  The patient tells me that she  is unable to walk any significant distance before her back and leg start  to hurt, and she begins to feel numb in her feet and does not think she  is able to walk.  For this reason, we will schedule her for an adenosine  Myoview study and if this is okay, she would be allowed to proceed with  back surgery.     Doylene Canning. Ladona Ridgel, MD  Electronically Signed    GWT/MedQ  DD: 05/17/2008  DT: 05/18/2008  Job #: [id number]   cc:   Kerrin Champagne, M.D.

## 2010-08-13 NOTE — Assessment & Plan Note (Signed)
[facility]                         ELECTROPHYSIOLOGY OFFICE NOTE   NAME:[name], [name]            MRN:          [id number]  DATE:11/19/2007                            DOB:          [birth year]    HISTORY OF PRESENT ILLNESS:  This is a middle-aged woman with a history  of unexplained syncope, status post insertion of the left implantable  loop recorder who recently underwent sleep study after her loop recorder  was placed.  The patient did not have any evidence of sleep apnea by her  report.  She has had palpitations and also couple of syncopal episodes  since her loop recorder was placed.  She returns today for followup.  She denies chest pain.  She denies shortness of breath.  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   PHYSICAL EXAMINATION:  GENERAL:  On exam today, she is a pleasant well-  appearing woman, in no distress.  VITAL SIGNS:  Blood pressure was 139/85, pulse 60 and regular,  respirations 18, the weight was 156 pounds.  NECK:  Revealed no jugular venous distention.  LUNGS:  Clear bilaterally to auscultation.  No wheezes, rales, or  rhonchi are present.  CARDIOVASCULAR:  Regular rate and rhythm.  Normal S1 and S2.  EXTREMITIES:  Demonstrate no edema.  Her loop recorder insertion site  was healed nicely.   Interrogation of her loop recorder demonstrates episodes of tachycardia  at rates up to 170 beats per minute.  I cannot discern whether this is  sinus tach or SVT.  I have recommend that we give her some atenolol 25-  50 mg a day.  I will see her back in 2 months to see how her medications  are controlling her rapid heart rates.     Doylene Canning. Ladona Ridgel, MD  Electronically Signed    GWT/MedQ  DD: 11/19/2007  DT: 11/19/2007  Job #: [id number]

## 2010-08-13 NOTE — Consult Note (Signed)
[name], [name]             ACCOUNT NO.:  [id number]   MEDICAL RECORD NO.:  [id number]          PATIENT TYPE:  INP   LOCATION:  5034                         FACILITY:  [facility]   PHYSICIAN:  [name], M.D. DATE OF BIRTH:  [birth year]   DATE OF CONSULTATION:  06/17/2008  DATE OF DISCHARGE:                                 CONSULTATION   REQUESTING PHYSICIANS:  Dr. August Saucer and Dr. Otelia Sergeant.   REASON FOR CONSULTATION:  Altered mental status.   HISTORY OF PRESENT ILLNESS:  This is a 55 year old female who was  admitted to the hospital on June 15, 2008 for surgery.  The patient was  admitted by the orthopedic service, Dr. Otelia Sergeant, for a lumbar surgery  which was performed on June 15, 2008 where she underwent a  decompression and bilateral foraminotomy of the L4 and L5 disk areas.  The patient was recovering well until the morning and afternoon of June 17, 2008, where she was lethargic and sluggish and unable to participate  with her physical therapy and occupational therapy regimens which had  been ordered.  The patient had been further evaluated and had received 4  mg of Dilaudid orally in the a.m. and became very sluggish thereafter.  The patient was administered Narcan IV times one dose and had slight  improvement but continued to improve.  The patient did not have loss of  responsiveness.  She did have mild hypotension.  At the time of my  interview with the patient, the patient was alert and oriented.  She did  not recall what had happened in the earlier day.  She denies having any  headache or chest pain or shortness of breath.  The patient has not  received further pain medications since the a.m.   PAST MEDICAL HISTORY:  1. Coronary artery disease.  2. Hypertension.  3. Dyslipidemia.  4. Depression.  5. HSV-2.  6. History of syncope.  7. Hypersomnia.   MEDICATIONS:  At this time, secondary to syncopal events, include:  Clonazepam, trazodone, Abilify, albuterol,  Protonix, valacyclovir,  Zetia, hydrochlorothiazide, diclofenac, Crestor, amlodipine, Trinsicon,  and post surgically, the patient has Dilaudid, IV Robaxin.   ALLERGIES:  CODEINE AND MORPHINE.   SOCIAL HISTORY:  The patient is a former smoker.  She is a nondrinker.   PHYSICAL EXAMINATION FINDINGS:  GENERAL:  This is a 55 year old  overweight female, currently in mild discomfort but no acute distress.  VITAL SIGNS:  Temperature 97.1, blood pressure 99/69, heart rate 82,  respirations 18, O2 sats 98%.  HEENT:  Normocephalic, atraumatic.  Pupils equally round, reactive to  light.  Extraocular movements are intact.  Funduscopic benign.  Oropharynx is clear.  NECK:  Supple, full range of motion.  No thyromegaly, adenopathy,  jugular venous distention.  CARDIOVASCULAR:  Regular rate and rhythm.  No murmurs, gallops or rubs.  LUNGS:  Clear to auscultation bilaterally.  ABDOMEN:  Positive bowel sounds, soft, nontender, nondistended.  EXTREMITIES:  Without cyanosis, clubbing or edema.  NEUROLOGICAL:  The patient is alert and oriented x3.  There are no focal  deficits on examination.   LABORATORY DATA:  The patient's medications have been reviewed and her  laboratory studies have also been reviewed.  A chest x-ray was performed  which revealed decreased lung volumes and a questionable right lower  lobe infiltrate which is very poorly visualized.   ASSESSMENT:  A 55 year old female with episode of unresponsiveness  linked to administration of Dilaudid therapy.  The patient is improving  at this point and continues to improve.   PLAN:  Plan has been to decrease the overall dosage of Dilaudid which  has been decreased from 4 mg orally q.4 h to 1 mg orally q.4 h p.r.n.  The patient has been encouraged to utilize the incentive spirometer  which is at bedside to help prevent atelectasis and pneumonia.  A CBC  with the differential has been requested to evaluate for leukocytosis to  give further  evidence of a possible beginning pneumonia.  The patient  did however have fevers which were noted on March 19 to a max of 101.8  at 4:00 a.m.  Her temperatures, however, have further defervesced and  notably she will be monitored due to her blood pressures which appear to  be on the lower side.  The patient will be administered a fluid  challenge secondary to her hypotension.  Blood cultures will be sent if  the patient does spike another temperature above 100.0.  The [facility]  team will continue to follow the patient while she is hospitalized.      [name], M.D.  Electronically Signed     HJ/MEDQ  D:  06/17/2008  T:  06/18/2008  Job:  [id number]

## 2010-08-13 NOTE — Assessment & Plan Note (Signed)
[facility]                            CARDIOLOGY OFFICE NOTE   NAME:RICHEYGenette, Huertas                      MRN:          [id number]  DATE:04/09/2007                            DOB:          12-14-1955    IDENTIFICATION:  Ms. Gomm is a 55 year old woman who was referred for  evaluation of syncope.   HISTORY OF PRESENT ILLNESS:  The patient has actually been seen by Birdena Jubilee and also [facility].  She was hospitalized in [facility] back in July.  She said she was at work and had a syncopal  spell.  She does not remember much.  She did say with a spell she gets  warm, flushed, feels like she is going to pass out and does.  Denies any  associated palpitations.  She was seen at [facility] again.  A CT  scan was done that she does not know the results of.  Since that time,  she has had two to three spells, again, difficulty remembering full  details, although with a warm flushed sensation.   She has been seen by Dr. Shelly Flatten.  He suggested an echocardiogram,  Holter, tilt table, carotid duplex.   The patient denies any dizziness now.  Has not had a recent flush  sensation, does get chest pain plus/minus activity intermittently.  Note  was hospitalized at [facility] as she said she had a heart attack 6 years  ago.  Workup is unknown.   ALLERGIES:  CODEINE AND MORPHINE.   MEDICATION:  She is out of all of them.  These include:  1. Protonix 40.  2. Nexium 40.  3. Valtrex 500.  4. Caduet 540.  5. Mobic.  6. Clonazepam one.  7. Cymbalta 60.  8. Metoclopramide 5.   PAST MEDICAL HISTORY:  1. Questions CAD.  2. Syncope.  3. Hypertension, questions.  4. History of herpes.  5. History of depression.  6. History of dyslipidemia.  7. History of question dissociative disorder.   SOCIAL HISTORY:  The patient does not smoke, stopped 30 years ago.  Does  not drink.  Does not exercise much because of a bad back.   FAMILY  HISTORY:  Heart attack in father, question age 13.  Brother with  heart attack, stents.   REVIEW OF SYSTEMS:  All systems reviewed, negative to the above problems  except as noted above and as already stated.   PHYSICAL EXAMINATION:  GENERAL:  The patient is currently in no  distress.  Blood pressure is 121/79, pulse is 73, weight 157.  HEENT:  Normocephalic, atraumatic.  EOMI.  PERRL.  Mucous membranes are  moist.  NECK:  No bruits.  No thyromegaly.  LUNGS:  Clear to auscultation without rales or wheezes.  CARDIAC:  Regular rate and rhythm, S1-S2.  No S3 or S4.  No murmurs.  ABDOMEN:  Supple.  No bruits.  Normal bowel sounds.  No masses.  EXTREMITIES:  Good distal pulses.  No lower extremity edema.  VITAL SIGNS:  Orthostatic blood pressures:  Lying blood pressure 121/79,  pulse 73; sitting 122/80, pulse 69;  standing zero minutes, 128/88, pulse  88; standing at 2 minutes, 118/90 pulse 85; 5 minutes 122/89 pulse 89.   STUDIES:  A 12-lead EKG shows normal sinus rhythm, 67 beats per minute.   IMPRESSION:  1. Syncope.  Ms. Pendry is not the best historian.  Her spells sounds      somewhat a vagal with this hot flush sensation.  She is not      orthostatic on exam today.  Will go ahead and schedule an      echocardiogram and carotid Dopplers.  With her history of coronary      disease, I need to get the records from [facility] before further      testing.  She has chest pain, but it is atypical.  I will be in      touch with her once I have seen the results.  2. Question CAD, again records from [facility].  3. Hypertension history.  I am not going to place her back on her      antihypertensives given the syncopal spells.  Again, her blood      pressure is okay today and she is not orthostatic.  4. Dyslipidemia.  Will set up for fasting lipids.  I will not refill      for now.   I will be in touch with the patient once I have seen the test results.  Continue activities as tolerated.  Watch  for spells, sit if she develops  any symptoms suggesting possible syncope.     Pricilla Riffle, MD, [facility]  Electronically Signed    PVR/MedQ  DD: 04/10/2007  DT: 04/10/2007  Job #: [id number]   cc:   Priscella Mann, MD

## 2010-08-13 NOTE — Assessment & Plan Note (Signed)
[facility]                         ELECTROPHYSIOLOGY OFFICE NOTE   NAME:RICHEYArriona, [name]            MRN:          [id number]  DATE:01/05/2008                            DOB:          1955/07/18    [name] returns today for followup.  She is a patient with  unexplained syncope, status post implantable loop recorder also with a  history of inappropriate sinus tachycardia, also with a history of  probable autonomic dysfunction and arthritis who returns today for  followup.  The patient states that once she started her atenolol which  we gave her because of what looks like sinus tachycardia, though I  cannot rule out atrial tachycardia on her implantable loop recorder, her  symptoms have been improved.  She denied chest pain.  She notes that her  spells now typically do not result in her passing out if she lies down  and rest.  She will often feel palpitations which then resolve with  rest.   MEDICATIONS:  1. Zegerid 40/1100 twice a day.  2. Atenolol 25 a day.  3. Cymbalta 60 a day.  4. Diclofenac 75 a day.  5. Meloxicam.   PHYSICAL EXAMINATION:  GENERAL:  She is a pleasant well-appearing middle-  aged woman in no acute distress.  VITAL SIGNS:  Blood pressure was 118/70, the pulse was 76 and regular,  respirations were 18, weight was 156 pounds.  NECK:  No jugular venous distention.  LUNGS:  Clear bilaterally to auscultation.  No wheezes, rales, or  rhonchi are present.  CARDIOVASCULAR:  Regular rate and rhythm.  Normal S1 and S2.  EXTREMITIES:  No edema.   Interrogation of her implantable loop recorder demonstrates sinus  tachycardia.  No brady arrhythmias were noted.   IMPRESSION:  1. Unexplained syncope.  2. Status post insertion of an implantable loop recorder with no      significant sinus or brady arrhythmias other than sinus      tachycardia.  3. 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     Doylene Canning. Ladona Ridgel, MD  Electronically Signed    GWT/MedQ  DD: 01/05/2008  DT: 01/05/2008  Job #: [id number]

## 2010-08-13 NOTE — Procedures (Signed)
[name], [name]         ACCOUNT NO.:  [id number]   MEDICAL RECORD NO.:  [id number]          PATIENT TYPE:  OUT   LOCATION:  SLEEP CENTER                 FACILITY:  [facility]   PHYSICIAN:  Barbaraann Share, MD,FCCPDATE OF BIRTH:  03/30/57   DATE OF STUDY:  09/05/2007                            NOCTURNAL POLYSOMNOGRAM   REFERRING PHYSICIAN:   LOCATION:  Sleep Lab.   INDICATION FOR STUDY:  Hypersomnia with sleep apnea.   EPWORTH SCORE:  3.   SLEEP ARCHITECTURE:  Patient had total sleep time of 365  minutes with  very little slow-wave sleep and normal quantity of rem.  Sleep onset  latency was prolonged at 43 minutes and rem onset was normal at 57  minutes.  Sleep efficiency was decreased at 86%.   RESPIRATORY DATA:  The patient was found to have two obstructive apneas,  two central apneas, and five obstructive hypopneas, for an apnea  hypopnea index of 1.5 events per hour.  The events were not positional  and there was very mild snoring noted throughout.   OXYGEN DATA:  There was O2 desaturation as low as 84% with the patient's  obstructive events.  This was transitory in nature.   CARDIAC DATA:  No clinically significant arrhythmias were noted.   MOVEMENTS/PARASOMNIA:  The patient was found to have no significant  periodic leg  movements nor abnormal behaviors.   IMPRESSION/RECOMMENDATION:  Small numbers of obstructive events which do  not meet the apnea hypopnea criteria for the obstructive sleep apnea  syndrome.  The patient did have very mild snoring at best and only  transient O2 desaturations of 84%.  If the patient is felt to have  significant daytime sleepiness, clinical correlation is suggested to  rule out other sleep disorders.      Barbaraann Share, MD,FCCP  Diplomate, American Board of Sleep  Medicine  Electronically Signed     KMC/MEDQ  D:  09/21/2007 15:39:21  T:  09/21/2007 16:12:27  Job:  [id number]

## 2010-08-13 NOTE — Op Note (Signed)
NAMEMEERAB, MASELLI             ACCOUNT NO.:  [id number]   MEDICAL RECORD NO.:  [id number]          PATIENT TYPE:  INP   LOCATION:  1610                         FACILITY:  [facility]   PHYSICIAN:  Kerrin Champagne, M.D.   DATE OF BIRTH:  01-May-1955   DATE OF PROCEDURE:  06/15/2008  DATE OF DISCHARGE:                               OPERATIVE REPORT   PREOPERATIVE DIAGNOSES:  1. Lumbar spinal stenosis, L4-5 primarily lateral recess and      foraminal.  2. Degenerative spondylolisthesis, L4-5, grade 1.   POSTOPERATIVE DIAGNOSES:  1. Lumbar spinal stenosis, L4-5 primarily lateral recess and      foraminal.  2. Degenerative spondylolisthesis, L4-5, grade 1.   PROCEDURES:  Bilateral lateral recess decompression and bilateral  foraminotomy, L4-5 with right-sided transforaminal lumbar interbody  fusion using a 10-mm DePuy lordotic Concorde cage with local bone graft.  Posterolateral fusion, L4-5 using combination of local bone graft and 10  mL of Vitoss with bone marrow aspirated from left vertebral body via the  left L5 pedicle.  Single level pedicle screw, rod fixation from L4 to  L5.  Monarch pedicle screws were used.   ASSISTANT:  Wende Neighbors, PA-C   ANESTHESIA:  General via orotracheal intubation, Dr. Noreene Larsson, local  anesthetic was used as well 10 mL of Marcaine 0.50% plain with 1:200,000  epinephrine.   FINDINGS:  The patient did have intraoperative neuro monitoring.  No  changes noted throughout.  Soft tissue resistance, pedicle screws of  left L4 27, left L5 25, right L4 23, and right L5 21.   ESTIMATED BLOOD LOSS:  200 mL.   COMPLICATIONS:  None.   DRAINS:  Foley to straight drain, Hemovac, right lumbar x1.   The patient returned to the PACU in good condition.   HISTORY OF PRESENT ILLNESS:  This patient is a 55 year old female who  has been followed over the past 8 months for problems with persistent  back pain, neurogenic claudication has been worsening.  She has  undergone attempts at conservative management primarily, epidural  steroid injection, therapy.  She has had decrement of function to where  she is having difficulty standing for any length of time greater than 15  minutes, walking any distance more than half block.  She has undergone  lumbar post myelogram CT scan, it demonstrates moderate sub-facet and  lateral recess stenosis bilateral at the L4-5 level, foraminal narrowing  associated with a grade 1 spondylolisthesis, L4-5.  She is requiring new  center in order to relieve her pain on a continuous basis.  She was  brought to the operating room to undergo decompression, L4-5 with fusion  of the spondylolisthetic segment.  The patient has undergone  preoperative evaluation including stress test, Cardiology evaluation,  does have a strong history of depression and in spite of this, does  demonstrate significant radiographic signs of spinal stenosis.   INTRAOPERATIVE FINDINGS:  As above.   DESCRIPTION OF PROCEDURE:  The patient was seen in the preoperative  holding area and the right L4-5 level area marked with X in initial.  Standard preoperative antibiotics of Ancef.  The patient after  undergoing general anesthesia and intubation, she had Foley catheter  placed.  She was rolled to a prone position using the [place] spine  frame.  The patient had intraoperative time-out, identification of the  procedure, person, and the site of the expected TLIF and expected length  of time of procedure, and blood loss.  Cell Saver was used during the  case, intraoperative neuro monitoring.   Standard prep with DuraPrep solution.  All pressure points well padded.  PAS Stockings used to prevent DVT in lower extremities.  She was draped  in the usual manner, iodine Vi-drape was used following the DuraPrep.  Standard lumbar incision in the midline starting from about L3-S1  through the skin and subcutaneous layers directly down to the spinous  process  and the incision then carried over both sides of the spinous  processes of L2, L3, L4, and L5.  She had infiltration of skin and subcu  layers with Marcaine 0.50% with 1:200,000 epinephrine 10 mL.  Cobb used  to perform subperiosteal dissection on both sides of the spinous  processes of L2, L3, L4, and L5 and then exposure obtained out over the  lateral aspects of the facets at the L4-5 level, L3-4 level, and L2-3  level.  Intraoperative C-arm fluoro used to identify levels with clamp  between the spinous process of L3-4 and L2-3.  These areas were marked.  Facet capsule at the L4-5 level was carefully resected bilaterally and  dissection then carried out laterally to expose the transverse process  of L5 bilaterally as well as the transverse process of L4 bilaterally  preserving the L3-4 facet capsule and L2-3 facet capsules.  With careful  exposure of the transverse processes as the packing of the  posterolateral region was performed.  Osteotome then used to remove a  portion of the inferior aspect of the lamina on the left side of L4 as  well as that on the right, osteotomizing the inferior articular process  of L4 on the left side and right side, removing about 50% of the  inferior articular process on the left side and the entire inferior  articular process on the right side.  A 3-mm Kerrison used to resect the  ligamentum flavum on the left side in its entirety off of the ventral  insertion into the ventral aspect of the inferior portion of the L4  lamina, resecting it dorsal to the thecal sac and off of the superior  aspect of the lamina of L5 on the left side.  Medial fasciectomy then  performed using 3-mm Kerrison removing medial of 30-40% of the L5  superior articular process on the left side decompressing the left L4  nerve root.  Hockey stick neuro probe could then be easily passed out  from L4 and L5 neuroforamen on the left.   Similarly, this was carried on to the right side  with complete  fasciectomy of the right L4-5 facet resecting the superior articular  process of L5 on the right to the upper aspect of the pedicle of L5 on  the right.  The ligamentum flavum was resected into the right  neuroforamen removing the reflected portion of the ligamentum flavum and  again the entire inferior articular process of L4 on the right as well  as the overlying pars area.  With this, then the thecal sac could be  retracted on the right and the L4 nerve root retracted superiorly with  disk at the L4-5 level  evaluated.  Portion of bone for the lateral and  ventral aspect of the superior articular process of L5 was found to be  loose, adjacent to the inferior aspect of the L4 nerve root and this was  resected using pituitary rongeur.  A 15 blade scalpel was used to incise  the disk on the right side of the L4-5 level in the posterolateral  margin.  Pituitary used to resect degenerative disk material.  C-arm  fluoro was then brought into the field, and an awl placed against the  intersection of the transverse process of L4 on the left, the lateral  aspect of the pedicle of the superior articular process of L4, and an  intraoperative fluoro used to discern correct position of angle for the  L4 pedicle.  An awl was then carefully placed into the cortical bone and  lateral aspect of the pedicle, and a pedicle probe used to probe the  pedicle, it was felt to be quite tight with the insertion of the probe  on the left side at L4 to a depth of approximately 40 mm.  Testing of  the opening into the pedicle then using a ball-tip probe demonstrated on  all 4 sides of the opening appeared to be intact without any signs of  breaching of cortex, a 5.5 screw was chosen due to the tightness of the  pedicle felt with probing.  Tapping was performed with a 4.5 tap and a  5.5 screw was then placed after first decorticating transverse process  of L4 on the left side, testing again after  performing tapping with the  ball-tip probe to ensure patency of the pedicle and also demonstrate no  sign of broaching of the pedicle.  A 40 mm x 5.5 screw was then placed  on the left obtaining excellent purchase.  Bone graft that had been  obtained locally, was placed over the decorticated transverse process of  L4 on the left.  Attention then turned to the L5 pedicle on the left  where an awl was used to make an entry point over the lateral aspect of  the pedicle and its intersection of the transverse process of L5  observed on C-arm fluoro to be in good positional alignment.  This was  then followed by probing of the pedicle using pedicle probe to a depth  of 14 mm.  Convergence was performed above the L4 and L5 levels.  C-arm  fluoro demonstrated the probe to be in good positional alignment, so I  removed the ball-tip probe, used to carefully probe the channel of the  pedicle ensuring there is patency and no sign of broaching of the  cortex.  The bone marrow aspiration equipment was then placed into the  pedicle into the vertebral body at a depth of about 40-45 mm and  aspiration of bone marrow from the L5 vertebral body carried out; 10 mL  was then placed over 10 mL Vitoss strip and this was then cut into  matchsticks.  Decortication was carried out over the transverse process  of L5 on the left and tapping performed using a 5.5 tap and a 6.25 screw  x 40 mm were chosen.  After applying Vitoss from the transverse process  of L4-L5 on the left side and retesting the opening for the pedicle  screw using a ball-tip probe ensuring patency and no sign of broaching  of cortex.  Final screw measuring 6.25 x 40 mm was inserted and a  correct degree  of convergence obtaining excellent purchase.  Attention  then turned to the right side where similarly pedicle screws of 4.5 at  the right L4 level and 6.25 at the L5 level were inserted, again using  the same sequence of an awl with C-arm fluoro  demonstrating the entry  point over the posterior aspect of the pedicle, posterolaterally this  intersection with the transverse process, then using pedicle probe to  probe the channel through the center of the pedicle on the right.  Note  that the mallet was necessary in order to diverse the pedicle central  area into the vertebral body of L5 and L4.  Ball-tip probe used to probe  the pedicle in both levels and ensure patency and no sign of the  broaching of the cortex by the pedicle probe.  A 35 mm x 5.5 screw was  used on the right side at L4 after tapping with a 4.75 tap and then  decorticating transverse process, rechecking the whole for patency, and  ensuring no broaching of cortex using the ball-tip probe and the pedicle  screw on the right at L4 was inserted and screwed into place measuring  35 mm x 5.5.  Finally, the screw at the right L5 level was placed by  using an awl just as previously to make an entry point over the  intersection of transverse process of L5 in the lateral aspect of the  pedicle.  Using the pedicle probe to 40 mm and then using a 6.25 screw x  40 mm to place this, this was tapped using a 5.5 tap and a 6.25 screw  was then used, placed without difficulty, decorticating transverse  process of L5 and L4, placing Vitoss posterolaterally.  Each of the  pedicle screws were then tested with the patient to at least 4 twitches,  it is measured between 21 and 27 and soft tissue resistance was found to  be quite adequate.  With this, then rods were then placed into the  fasteners of the screws at L4 and L5 and caps were then placed without  difficulty.  Attention turned to performance of the TLIF on the right at  the L4 and L5 level where the thecal sac was retracted medially and the  L4 nerve root superiorly.  Pituitary rongeurs used to remove further  disk material, a lamina spreader used on the left side, the side  opposite, in order to distract disk space.  Disk  space was then debrided  of degenerative disk material using sequential pituitary rongeurs, both  straight and upbiting until degenerative disk had been removed in its  entirety.  Endplate curettage was then carried out to remove any  cartilaginous endplate material down to the bleeding endplate bone  surface and this was done using a straight upbiting right and upbiting  left curettes as well as ring curettes down to the bleeding bone,  observed by using loupe magnification and headlamp.  Loupe magnification  and headlamp was used throughout the case.  This was completed and the  disk space appeared to be well machined for accepting a graft.  A trial  was performed using 7, 8, 9, 10, 11 mm providing a tight fit.  The 10 mm  was felt to be a much better fit for the patient's size and this was  accepted.  A lordotic Concorde cage was chosen.  It was packed with  local bone graft.  Additional bone graft was then placed within the  intervertebral  disk space and then impacted into place using an 8-mm  trial.  Final cage was then placed into correct degree of anteversion  about 35-40 degrees and at times appropriate facing medially.  This was  then impacted into place across the disk space in about 35-to 40-degree  angle, the subset beneath the posterior aspect of disk space is quite  nicely by 3 or 4 mm.  Irrigation was carried out.  Careful inspection of  the spinal canal demonstrating no further bone material within the  spinal canal or nerve compression.  The L4 and L5 nerve roots were  tested, evaluated using hockey stick neuro probe demonstrated no sign of  further compression.  At this point, then the fasteners were tightened  to the rod at the L4 level to 80 foot pounds.  Compression obtained on  the right side between the pedicle fastener of L4 and L5 and the  fastener then tightened to the rod at the right side of the L5 level  obtaining compression here on the right side at the entry  point for the  TLIF.  Turning to the left side similarly, then compression obtained  between the fastener of L4 and L5 and the L5 pedicle screw fastener was  then tightened to the rod at 80 foot pounds.  This completed the  internal fixation and intraoperative C-arm fluoro was obtained in AP and  lateral plains demonstrating the internal fixation with pedicle screws and rods and the anterior TLIF in good positional alignment.  Again,  irrigation was carried out and the devitalized muscle was debrided.  The  remaining bone graft was placed posterolaterally on the left and right  sides extending from the transverse process of L4 and L5.  Each of the  foramen at L4 and L5 were again examined and using hockey stick neuro  probe probed to ensure their patency and no sign of nerve root  compression that was present.  Medium Hemovac drain was placed on the  right side existing out on the right lumbar spine.  Lumbodorsal fascia  was then reapproximated to spinous processes and interspinous ligaments  with interrupted #1 Vicryl sutures.  Deep subcu layers approximated with  interrupted 0 Vicryl sutures and skin closed with a running subcu stitch  after application of 2-0 Vicryl sutures in more superficial layers.  Skin was closed with the a subcuticular stitch of 4-0 Vicryl and  Dermabond was applied.  A dressing of 4 x 4 was fixed to the skin with  Hypafix tape.  The patient was then carefully reactivated following  return to supine position on her bed and then reactivated, extubated,  returned to the recovery room in satisfactory condition.  All  instruments and sponge counts were correct.  Intraoperative neuro  monitoring demonstrated no change in leads throughout this case.      Kerrin Champagne, M.D.  Electronically Signed     JEN/MEDQ  D:  06/15/2008  T:  06/16/2008  Job:  [id number]

## 2010-08-13 NOTE — Op Note (Signed)
NAMESEHAR, SEDANO             ACCOUNT NO.:  [id number]   MEDICAL RECORD NO.:  [id number]          PATIENT TYPE:  OIB   LOCATION:  2852                         FACILITY:  [facility]   PHYSICIAN:  Doylene Canning. Ladona Ridgel, MD    DATE OF BIRTH:  21-Aug-1955   DATE OF PROCEDURE:  08/11/2007  DATE OF DISCHARGE:  08/11/2007                               OPERATIVE REPORT   PROCEDURE PERFORMED:  Insertion of implantable loop recorder.   INDICATION:  Recurrent unexplained syncope.   INTRODUCTION:  The patient is a 55 year old woman with recurrent  episodes of syncope, unclear etiology.  She is now referred for  insertion of an implantable loop recorder.   PROCEDURE:  After informed consent was obtained, the patient was taken  to the Diagnostic EP Lab in the fasting state.  After usual preparation  and draping, intravenous fentanyl and midazolam was given for sedation.  A 30 mL of lidocaine was infiltrated into the left pectoral region.  A 3-  cm incision was carried out approximately 4 cm below the clavicle and 2-  3 cm to the left of midline.  Electrocautery was utilized to dissect  down and make a subcutaneous pocket.  Electrocautery was utilized to  assure hemostasis.  Kanamycin irrigation was utilized to irrigate the  pocket.  The Medtronic Reveal DX, model [id number], implantable loop recorder,  serial number [id number], was placed in the subcutaneous pocket and  secured with a soaked suture.  Additional kanamycin was then utilized to  irrigate the pocket and the incision was closed with 2-0 Vicryl and 3-0  Vicryl.  Benzoin was painted on the skin.  Steri-Strips were applied and  a pressure dressing was placed.  The patient was returned to her room in  satisfactory condition.   COMPLICATIONS:  There were no immediate complications.   RESULTS:  This demonstrates successful removal of implantable loop  recorder in a patient with unexplained syncope without immediate  procedure  complications.      Doylene Canning. Ladona Ridgel, MD  Electronically Signed     GWT/MEDQ  D:  08/11/2007  T:  08/12/2007  Job:  [id number]   cc:   Pricilla Riffle, MD, [facility]

## 2010-08-13 NOTE — Op Note (Signed)
[name], [name]             ACCOUNT NO.:  [id number]   MEDICAL RECORD NO.:  [id number]          PATIENT TYPE:  OIB   LOCATION:  2899                         FACILITY:  [facility]   PHYSICIAN:  Doylene Canning. Ladona Ridgel, MD    DATE OF BIRTH:  18-Feb-1956   DATE OF PROCEDURE:  06/15/2007  DATE OF DISCHARGE:                               OPERATIVE REPORT   PROCEDURE PERFORMED:  Head-up tilt table testing.   INDICATIONS:  Unexplained syncope.   INTRODUCTION:  The patient is a very pleasant 55 year old woman with a  history of recurrent episodes of syncope who is referred by Dr. Tenny Craw for  head-up tilt table testing secondary to her unexplained syncope.   PROCEDURE:  After informed consent was obtained, the patient was taken  to the diagnostic catheterization lab in fasting state.  She was placed  in the supine position, where her initial heart rate was in the 60s and  her blood pressure was 140 range.  She was placed in the head-up tilt  table position.  Her heart rate increased up into the mid to high 70s  and blood pressure remained in the 130 range.  She was maintained the 45  degree head-up tilt table position for a total of 45 minutes.  She had  one transient increase in heart rate up to 108 beats per minute.  Her  blood pressure remained in the 120 range predominantly with occasional  drops down into the 115 range.  During this time, she had very minimal  amounts of dizziness and lightheadedness, but was otherwise  hemodynamically stable.  After 45 minutes of tilting, she was returned  to the supine position and returned back to her room in satisfactory  condition.   COMPLICATIONS:  There were no immediate procedure complications.   RESULTS:  This demonstrates a negative head-up tilt table test for  inducible syncope.  The patient had no evidence of venostasis as there  was no significant heart rate increases during tilting.      Doylene Canning. Ladona Ridgel, MD  Electronically Signed     GWT/MEDQ  D:  06/15/2007  T:  06/15/2007  Job:  [id number]   cc:   Pricilla Riffle, MD, [facility]
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
## 2010-08-16 NOTE — Procedures (Signed)
NAMEJERMESHA, [name]         ACCOUNT NO.:  [id number]   MEDICAL RECORD NO.:  [id number]          PATIENT TYPE:  OUT   LOCATION:  SLEEP CENTER                 FACILITY:  [facility]   PHYSICIAN:  Barbaraann Share, MD,FCCPDATE OF BIRTH:  July 03[birth year] 1956   DATE OF STUDY:  09/06/2007                          MULTIPLE SLEEP LATENCY TEST   REFERRING PHYSICIAN:  Casimiro Needle L. Thad Ranger, M.D.   INDICATION FOR STUDY:  Hypersomnia of unknown origin.   EPWORTH SLEEPINESS SCORE:  3.   NAP 1:  Sleep onset latency 2 minutes, REM onset latency not applicable.   NAP 2:  Sleep onset latency 4.5 minutes, REM onset latency not  applicable.   NAP 3:  Sleep onset latency 6.5 minutes, REM onset latency not  applicable.   NAP 4:  Sleep onset latency 1.5 minutes, REM onset latency not  applicable.   NAP 5:  Sleep onset latency 6 minutes, REM onset latency not applicable.   MEAN SLEEP LATENCY:  4.1 minutes.   COMMENTS:  The patient had NPSG prior to the MSLT, which showed no  clinically significant obstructive sleep apnea, and a total sleep time  of 365 minutes.  It should be noted the patient did not know all of her  current medications and did not bring sleep logs to the sleep center for  review.   IMPRESSIONS-RECOMMENDATIONS:  Mean sleep latency during the Multiple  Sleep Latency Test of 4.1 minutes, which is indicative of significant  objective daytime sleepiness.  There were no sleep onset REM periods.  However, it is unclear from the available data if the patient was taking  medications at the time which interferes with REM.  Clinical correlation  is suggested.  If the patient is not taking REM suppressants, this study  may be consistent with idiopathic hypersomnia, chronic sleep  deprivation, or possibly sedating medication effects.     Barbaraann Share, MD,FCCP  Diplomate, American Board of Sleep  Medicine  Electronically Signed    KMC/MEDQ  D:  09/21/2007 16:11:31  T:  09/21/2007 16:26:05   Job:  [id number]
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
## 2010-10-14 ENCOUNTER — Encounter: Payer: Self-pay | Admitting: Internal Medicine

## 2010-10-18 ENCOUNTER — Telehealth: Payer: Self-pay | Admitting: Internal Medicine
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
## 2010-10-22 ENCOUNTER — Ambulatory Visit (INDEPENDENT_AMBULATORY_CARE_PROVIDER_SITE_OTHER): Payer: Medicare Other | Admitting: Internal Medicine

## 2010-10-22 ENCOUNTER — Encounter: Payer: Self-pay | Admitting: Internal Medicine

## 2010-10-22 DIAGNOSIS — I10 Essential (primary) hypertension: Secondary | ICD-10-CM

## 2010-10-22 DIAGNOSIS — R55 Syncope and collapse: Secondary | ICD-10-CM

## 2010-10-22 MED ORDER — ATENOLOL 25 MG PO TABS: 25.0000 mg | ORAL_TABLET | Freq: Two times a day (BID) | ORAL | Status: DC

## 2010-10-22 NOTE — Assessment & Plan Note (Signed)
Her blood pressure is slightly elevated today. She will go back on her atenolol.

## 2010-10-22 NOTE — Patient Instructions (Signed)
Your physician recommends that you schedule a follow-up appointment in: 3 months with device clinic and 6 months with Dr Ladona Ridgel  Restart Atenolol  Increase salt and fluid intake

## 2010-10-22 NOTE — Progress Notes (Signed)
Addended by: Dennis Bast F on: 10/22/2010 10:07 AM   Modules accepted: Orders

## 2010-10-22 NOTE — Progress Notes (Signed)
HPI [name] returns today for followup. She has a history of autonomic dysfunction and syncope. She is status post implantable loop recorder. She describes an episode of syncope which occurred approximately 10 days ago. The patient was shopping with her family and passed out. She has no recollection of the entire day. Her family tells her that should she was unconscious for several minutes but did not hurt herself. She also described feeling bad right before she went out. She admits to stopping her atenolol. She denies palpitations. Allergies  Allergen Reactions  . Codeine   . Morphine And Related      Current Outpatient Prescriptions  Medication Sig Dispense Refill  . acyclovir (ZOVIRAX) 5 % ointment Apply topically as needed.        [name] albuterol (PROAIR HFA) 108 (90 BASE) MCG/ACT inhaler Inhale 2 puffs into the lungs every 6 (six) hours as needed.        . ALPRAZolam (XANAX) 1 MG tablet Take 1 mg by mouth at bedtime as needed.        [name] atenolol (TENORMIN) 25 MG tablet Take 25 mg by mouth 2 (two) times daily.        [name] atorvastatin (LIPITOR) 40 MG tablet Take 40 mg by mouth daily.        . benztropine (COGENTIN) 2 MG tablet Take 2 mg by mouth daily.        . carbamazepine (TEGRETOL) 200 MG tablet Take 200 mg by mouth 2 (two) times daily.        . diclofenac sodium (VOLTAREN) 1 % GEL Apply topically as needed.        . etodolac (LODINE) 400 MG tablet Take 400 mg by mouth 2 (two) times daily.        . hydrochlorothiazide (HYDRODIURIL) 12.5 MG tablet Take 12.5 mg by mouth daily.        . nitroGLYCERIN (NITROSTAT) 0.4 MG SL tablet Place 0.4 mg under the tongue every 5 (five) minutes as needed.        . pantoprazole (PROTONIX) 40 MG tablet Take 40 mg by mouth daily.        . traMADol (ULTRAM) 50 MG tablet Take 50 mg by mouth every 6 (six) hours as needed.        . traZODone (DESYREL) 100 MG tablet Take 200 mg by mouth daily.        . valACYclovir (VALTREX) 500 MG tablet Take 500 mg by mouth 2  (two) times daily.           Past Medical History  Diagnosis Date  . Pneumonia   . Anxiety   . Herpes simplex infection   . Osteoporosis   . Sleep apnea   . Hypertension   . Dyslipidemia   . Depression   . Asthma   . Fibromyalgia   . GERD (gastroesophageal reflux disease)   . Head injury, unspecified   . Supraventricular tachycardia   . Syncope and collapse   . Coronary artery disease   . Spondylolisthesis   . Spinal stenosis of lumbar region     ROS:   All systems reviewed and negative except as noted in the HPI.   Past Surgical History  Procedure Date  . Insertion of implatable loop recorder 08/11/2007    [name]  . Head up tilt table testing 06/15/2007    [name]  . Back surgery      Family History  Problem Relation Age of Onset  . Heart  attack Father   . Heart attack Brother     stents     History   Social History  . Marital Status: Single    Spouse Name: N/A    Number of Children: N/A  . Years of Education: N/A   Occupational History  . Not on file.   Social History Main Topics  . Smoking status: Former Smoker    Types: Cigarettes  . Smokeless tobacco: Not on file  . Alcohol Use: No  . Drug Use:   . Sexually Active:    Other Topics Concern  . Not on file   Social History Narrative  . No narrative on file     BP 139/87  Pulse 76  Resp 16  Ht 5\' 2"  (1.575 m)  Wt 139 lb (63.05 kg)  BMI 25.42 kg/m2  Physical Exam:  Well appearing NAD HEENT: Unremarkable Neck:  No JVD, no thyromegally Lymphatics:  No adenopathy Back:  No CVA tenderness Lungs:  Clear with a well-healed implantable loop recorder in place. HEART:  Regular rate rhythm, no murmurs, no rubs, no clicks Abd:  soft, positive bowel sounds, no organomegally, no rebound, no guarding Ext:  2 plus pulses, no edema, no cyanosis, no clubbing Skin:  No rashes no nodules Neuro:  CN II through XII intact, motor grossly intact  DEVICE  Normal device function.  See  PaceArt for details. Interrogation demonstrated a normal heart rate during her described syncopal episode. Additional interrogation demonstrates an episode of sinus tachycardia versus SVT which was asymptomatic.  Assess/Plan:

## 2010-10-22 NOTE — Assessment & Plan Note (Signed)
The etiology of the patient's syncope appears to be fairly clear. She has autonomic dysfunction. I have asked her to restart her atenolol. She will increase her salt and fluid intake. If she has recurrent episodes then we would consider adding Florinef or midodrine. I'll see her back in several months. Finally I've asked that when she feels bad or as if she is about to pass out, she is to lie down immediately. She is recommended not to drive.

## 2010-11-12 ENCOUNTER — Encounter: Payer: Medicare Other | Admitting: Internal Medicine

## 2011-01-22 ENCOUNTER — Ambulatory Visit (INDEPENDENT_AMBULATORY_CARE_PROVIDER_SITE_OTHER): Payer: Medicare Other | Admitting: *Deleted

## 2011-01-22 ENCOUNTER — Encounter: Payer: Self-pay | Admitting: Internal Medicine

## 2011-01-22 DIAGNOSIS — R55 Syncope and collapse: Secondary | ICD-10-CM

## 2011-01-22 NOTE — Progress Notes (Signed)
Loop recorder check  

## 2011-01-30 ENCOUNTER — Encounter: Payer: Self-pay | Admitting: Internal Medicine

## 2011-01-30 ENCOUNTER — Ambulatory Visit (INDEPENDENT_AMBULATORY_CARE_PROVIDER_SITE_OTHER): Payer: Medicare Other | Admitting: Internal Medicine

## 2011-01-30 DIAGNOSIS — R55 Syncope and collapse: Secondary | ICD-10-CM

## 2011-01-30 DIAGNOSIS — I10 Essential (primary) hypertension: Secondary | ICD-10-CM

## 2011-01-30 NOTE — Assessment & Plan Note (Signed)
Her episodes of syncope appear to be stable. Will follow. Her ILR today shows SVT at 175/min.

## 2011-01-30 NOTE — Patient Instructions (Signed)
Your physician recommends that you schedule a follow-up appointment in: 05/2011 with Dr Ladona Ridgel

## 2011-01-30 NOTE — Assessment & Plan Note (Signed)
Her blood pressure is well controlled. She will continue her current meds.  

## 2011-01-30 NOTE — Progress Notes (Signed)
HPI [name] returns today for followup. She is a pleasant 55 yo woman with a h/o unexplained syncope, HTN, and dyslipidemia. The patient denies sob, or peripheral edema. She does have occasional c/p associated with palpitations. With her h/o syncope, she does have an ILR which has reached ERI.   Allergies  Allergen Reactions  . Codeine   . Morphine And Related      Current Outpatient Prescriptions  Medication Sig Dispense Refill  . acyclovir (ZOVIRAX) 5 % ointment Apply topically as needed.        [name] albuterol (PROAIR HFA) 108 (90 BASE) MCG/ACT inhaler Inhale 2 puffs into the lungs every 6 (six) hours as needed.        . ALPRAZolam (XANAX) 1 MG tablet Take 1 mg by mouth at bedtime as needed.        [name] atenolol (TENORMIN) 25 MG tablet Take 1 tablet (25 mg total) by mouth 2 (two) times daily.  60 tablet  11  . atorvastatin (LIPITOR) 40 MG tablet Take 40 mg by mouth daily.        . butalbital-acetaminophen-caffeine (FIORICET, ESGIC) 50-325-40 MG per tablet Take 1 tablet by mouth 2 (two) times daily as needed.        . carbamazepine (TEGRETOL) 200 MG tablet Take 200 mg by mouth 2 (two) times daily.        . diclofenac (VOLTAREN) 50 MG EC tablet Take 50 mg by mouth 3 (three) times daily.        . diclofenac sodium (VOLTAREN) 1 % GEL Apply topically as needed.        . hydrochlorothiazide (HYDRODIURIL) 12.5 MG tablet Take 12.5 mg by mouth daily.        . nitroGLYCERIN (NITROSTAT) 0.4 MG SL tablet Place 0.4 mg under the tongue every 5 (five) minutes as needed.        . pantoprazole (PROTONIX) 40 MG tablet Take 40 mg by mouth daily.        . traMADol (ULTRAM) 50 MG tablet Take 50 mg by mouth every 6 (six) hours as needed.        . traZODone (DESYREL) 100 MG tablet Take 200 mg by mouth daily.        . valACYclovir (VALTREX) 500 MG tablet Take 500 mg by mouth 2 (two) times daily.           Past Medical History  Diagnosis Date  . Pneumonia   . Anxiety   . Herpes simplex infection   .  Osteoporosis   . Sleep apnea   . Hypertension   . Dyslipidemia   . Depression   . Asthma   . Fibromyalgia   . GERD (gastroesophageal reflux disease)   . Head injury, unspecified   . Supraventricular tachycardia   . Syncope and collapse   . Coronary artery disease   . Spondylolisthesis   . Spinal stenosis of lumbar region     ROS:   All systems reviewed and negative except as noted in the HPI.   Past Surgical History  Procedure Date  . Insertion of implatable loop recorder 08/11/2007    [name]  . Head up tilt table testing 06/15/2007    [name]  . Back surgery   . Doppler echocardiography 2009     Family History  Problem Relation Age of Onset  . Heart attack Father   . Heart attack Brother     stents     History   Social History  .  Marital Status: Single    Spouse Name: N/A    Number of Children: N/A  . Years of Education: N/A   Occupational History  . Not on file.   Social History Main Topics  . Smoking status: Former Smoker    Types: Cigarettes  . Smokeless tobacco: Not on file  . Alcohol Use: No  . Drug Use:   . Sexually Active:    Other Topics Concern  . Not on file   Social History Narrative  . No narrative on file     BP 120/84  Pulse 48  Ht 5\' 2"  (1.575 m)  Wt 140 lb (63.504 kg)  BMI 25.61 kg/m2  Physical Exam:  Well appearing middle aged woman NAD HEENT: Unremarkable Neck:  No JVD, no thyromegally Lymphatics:  No adenopathy Back:  No CVA tenderness Lungs:  Clear with no wheezes, rales, or rhonchi HEART:  Regular rate rhythm, no murmurs, no rubs, no clicks Abd:  soft, positive bowel sounds, no organomegally, no rebound, no guarding Ext:  2 plus pulses, no edema, no cyanosis, no clubbing Skin:  No rashes no nodules Neuro:  CN II through XII intact, motor grossly intact  DEVICE  Normal device function.  See PaceArt for details. Her ILR is at [facility]. Will see her back in several months.  Assess/Plan:

## 2011-04-10 DIAGNOSIS — R300 Dysuria: Secondary | ICD-10-CM | POA: Diagnosis not present

## 2011-04-10 DIAGNOSIS — K219 Gastro-esophageal reflux disease without esophagitis: Secondary | ICD-10-CM | POA: Diagnosis not present

## 2011-04-10 DIAGNOSIS — J189 Pneumonia, unspecified organism: Secondary | ICD-10-CM | POA: Diagnosis not present

## 2011-04-24 DIAGNOSIS — M48061 Spinal stenosis, lumbar region without neurogenic claudication: Secondary | ICD-10-CM | POA: Diagnosis not present

## 2011-04-24 DIAGNOSIS — M169 Osteoarthritis of hip, unspecified: Secondary | ICD-10-CM | POA: Diagnosis not present

## 2011-04-24 DIAGNOSIS — IMO0002 Reserved for concepts with insufficient information to code with codable children: Secondary | ICD-10-CM | POA: Diagnosis not present

## 2011-04-24 DIAGNOSIS — M1610 Unilateral primary osteoarthritis, unspecified hip: Secondary | ICD-10-CM | POA: Diagnosis not present

## 2011-04-24 DIAGNOSIS — M47817 Spondylosis without myelopathy or radiculopathy, lumbosacral region: Secondary | ICD-10-CM | POA: Diagnosis not present

## 2011-05-05 DIAGNOSIS — M545 Low back pain, unspecified: Secondary | ICD-10-CM | POA: Diagnosis not present

## 2011-05-05 DIAGNOSIS — M47817 Spondylosis without myelopathy or radiculopathy, lumbosacral region: Secondary | ICD-10-CM | POA: Diagnosis not present

## 2011-05-09 DIAGNOSIS — E559 Vitamin D deficiency, unspecified: Secondary | ICD-10-CM | POA: Diagnosis not present

## 2011-05-09 DIAGNOSIS — E785 Hyperlipidemia, unspecified: Secondary | ICD-10-CM | POA: Diagnosis not present

## 2011-05-09 DIAGNOSIS — I10 Essential (primary) hypertension: Secondary | ICD-10-CM | POA: Diagnosis not present

## 2011-05-09 DIAGNOSIS — E782 Mixed hyperlipidemia: Secondary | ICD-10-CM | POA: Diagnosis not present

## 2011-05-09 DIAGNOSIS — J45909 Unspecified asthma, uncomplicated: Secondary | ICD-10-CM | POA: Diagnosis not present

## 2011-05-22 DIAGNOSIS — M47817 Spondylosis without myelopathy or radiculopathy, lumbosacral region: Secondary | ICD-10-CM | POA: Diagnosis not present

## 2011-05-26 ENCOUNTER — Encounter: Payer: Self-pay | Admitting: Internal Medicine

## 2011-05-26 ENCOUNTER — Ambulatory Visit (INDEPENDENT_AMBULATORY_CARE_PROVIDER_SITE_OTHER): Payer: Medicare Other | Admitting: Internal Medicine

## 2011-05-26 DIAGNOSIS — R079 Chest pain, unspecified: Secondary | ICD-10-CM

## 2011-05-26 DIAGNOSIS — R55 Syncope and collapse: Secondary | ICD-10-CM

## 2011-05-26 DIAGNOSIS — I2510 Atherosclerotic heart disease of native coronary artery without angina pectoris: Secondary | ICD-10-CM | POA: Diagnosis not present

## 2011-05-26 DIAGNOSIS — I10 Essential (primary) hypertension: Secondary | ICD-10-CM | POA: Diagnosis not present

## 2011-05-26 DIAGNOSIS — R0989 Other specified symptoms and signs involving the circulatory and respiratory systems: Secondary | ICD-10-CM

## 2011-05-26 LAB — PACEMAKER DEVICE OBSERVATION: PACEART TECH NOTES PM: 3

## 2011-05-26 NOTE — Assessment & Plan Note (Signed)
The patient has had recurrent chest pain with typical as well as atypical features for coronary ischemia. Because of a history of back pain and spinal stenosis, she was in a walker treadmill. I recommended that she undergo lexi- scan Myoview.

## 2011-05-26 NOTE — Assessment & Plan Note (Signed)
She has had no recurrent events. We discussed the option of removing her implantable loop recorder but she at this point would like not to have it taken out.

## 2011-05-26 NOTE — Progress Notes (Signed)
HPI [name] returns today for followup. She is a 56 year old woman with a history of unexplained syncope, status post implantable loop recorder. She has had no bradycardia events. She does have sinus tachycardia and questionable SVT. She denies recurrent syncope. Over the last several weeks, the patient is known increasing episodes of chest pain. These are relieved with nitroglycerin. At times they are related to exertion at other times emotional stress. No associated nausea or vomiting. She does have associated dyspnea. The patient does have chronic back pain and this limits her mobility. Allergies  Allergen Reactions  . Codeine   . Morphine And Related      Current Outpatient Prescriptions  Medication Sig Dispense Refill  . acyclovir (ZOVIRAX) 5 % ointment Apply topically as needed.        [name] albuterol (PROAIR HFA) 108 (90 BASE) MCG/ACT inhaler Inhale 2 puffs into the lungs every 6 (six) hours as needed.        . ALPRAZolam (XANAX) 1 MG tablet Take 1 mg by mouth 3 (three) times daily as needed.       [name] atenolol (TENORMIN) 25 MG tablet Take 1 tablet (25 mg total) by mouth 2 (two) times daily.  60 tablet  11  . atorvastatin (LIPITOR) 40 MG tablet Take 40 mg by mouth daily.        . butalbital-acetaminophen-caffeine (FIORICET, ESGIC) 50-325-40 MG per tablet Take 1 tablet by mouth 2 (two) times daily as needed.        . carbamazepine (TEGRETOL) 200 MG tablet Take 200 mg by mouth 2 (two) times daily.        . diclofenac (VOLTAREN) 50 MG EC tablet Take 50 mg by mouth 3 (three) times daily.        . diclofenac sodium (VOLTAREN) 1 % GEL Apply topically as needed.        . hydrochlorothiazide (HYDRODIURIL) 12.5 MG tablet Take 12.5 mg by mouth daily.        . nitroGLYCERIN (NITROSTAT) 0.4 MG SL tablet Place 0.4 mg under the tongue every 5 (five) minutes as needed.        . pantoprazole (PROTONIX) 40 MG tablet Take 40 mg by mouth daily.        . traMADol (ULTRAM) 50 MG tablet Take 50 mg by mouth  every 6 (six) hours as needed.        . traZODone (DESYREL) 100 MG tablet Take 200 mg by mouth daily.        . valACYclovir (VALTREX) 500 MG tablet Take 500 mg by mouth 2 (two) times daily.           Past Medical History  Diagnosis Date  . Pneumonia   . Anxiety   . Herpes simplex infection   . Osteoporosis   . Sleep apnea   . Hypertension   . Dyslipidemia   . Depression   . Asthma   . Fibromyalgia   . GERD (gastroesophageal reflux disease)   . Head injury, unspecified   . Supraventricular tachycardia   . Syncope and collapse   . Coronary artery disease   . Spondylolisthesis   . Spinal stenosis of lumbar region     ROS:   All systems reviewed and negative except as noted in the HPI.   Past Surgical History  Procedure Date  . Insertion of implatable loop recorder 08/11/2007    [name]  . Head up tilt table testing 06/15/2007    [name]  . Back surgery   .  Doppler echocardiography 2009     Family History  Problem Relation Age of Onset  . Heart attack Father   . Heart attack Brother     stents     History   Social History  . Marital Status: Single    Spouse Name: N/A    Number of Children: N/A  . Years of Education: N/A   Occupational History  . Not on file.   Social History Main Topics  . Smoking status: Former Smoker    Types: Cigarettes  . Smokeless tobacco: Not on file  . Alcohol Use: No  . Drug Use:   . Sexually Active:    Other Topics Concern  . Not on file   Social History Narrative  . No narrative on file     BP 112/74  Pulse 58  Wt 64.864 kg (143 lb)  Physical Exam:  Well appearing middle-aged woman, NAD HEENT: Unremarkable Neck:  No JVD, no thyromegally Lymphatics:  No adenopathy Back:  No CVA tenderness Lungs:  Clear with no wheezes, rales, or rhonchi. HEART:  Regular rate rhythm, no murmurs, no rubs, no clicks Abd:  soft, positive bowel sounds, no organomegally, no rebound, no guarding Ext:  2 plus pulses, no  edema, no cyanosis, no clubbing Skin:  No rashes no nodules Neuro:  CN II through XII intact, motor grossly intact   DEVICE  Normal device function.  See PaceArt for details.   Assess/Plan:

## 2011-05-26 NOTE — Patient Instructions (Signed)
Your physician wants you to follow-up in: 6 months with Dr Taylor You will receive a reminder letter in the mail two months in advance. If you don't receive a letter, please call our office to schedule the follow-up appointment.   Your physician has requested that you have a lexiscan myoview. For further information please visit [email or web address]. Please follow instruction sheet, as given.    

## 2011-05-26 NOTE — Assessment & Plan Note (Signed)
Her blood pressure appears to be well-controlled. She'll maintain a low-sodium diet and continue her current medical therapy.

## 2011-05-27 DIAGNOSIS — M545 Low back pain, unspecified: Secondary | ICD-10-CM | POA: Diagnosis not present

## 2011-06-02 DIAGNOSIS — M545 Low back pain, unspecified: Secondary | ICD-10-CM | POA: Diagnosis not present

## 2011-06-05 ENCOUNTER — Ambulatory Visit (HOSPITAL_COMMUNITY): Payer: Medicare Other | Attending: Cardiology | Admitting: Radiology

## 2011-06-05 DIAGNOSIS — J45909 Unspecified asthma, uncomplicated: Secondary | ICD-10-CM | POA: Insufficient documentation

## 2011-06-05 DIAGNOSIS — I10 Essential (primary) hypertension: Secondary | ICD-10-CM | POA: Insufficient documentation

## 2011-06-05 DIAGNOSIS — Z87891 Personal history of nicotine dependence: Secondary | ICD-10-CM | POA: Insufficient documentation

## 2011-06-05 DIAGNOSIS — R002 Palpitations: Secondary | ICD-10-CM | POA: Insufficient documentation

## 2011-06-05 DIAGNOSIS — R61 Generalized hyperhidrosis: Secondary | ICD-10-CM | POA: Diagnosis not present

## 2011-06-05 DIAGNOSIS — I2510 Atherosclerotic heart disease of native coronary artery without angina pectoris: Secondary | ICD-10-CM

## 2011-06-05 DIAGNOSIS — R000 Tachycardia, unspecified: Secondary | ICD-10-CM | POA: Diagnosis not present

## 2011-06-05 DIAGNOSIS — R55 Syncope and collapse: Secondary | ICD-10-CM | POA: Diagnosis not present

## 2011-06-05 DIAGNOSIS — R0789 Other chest pain: Secondary | ICD-10-CM | POA: Diagnosis not present

## 2011-06-05 DIAGNOSIS — R0602 Shortness of breath: Secondary | ICD-10-CM | POA: Diagnosis not present

## 2011-06-05 DIAGNOSIS — R079 Chest pain, unspecified: Secondary | ICD-10-CM

## 2011-06-05 DIAGNOSIS — R42 Dizziness and giddiness: Secondary | ICD-10-CM | POA: Diagnosis not present

## 2011-06-05 DIAGNOSIS — E785 Hyperlipidemia, unspecified: Secondary | ICD-10-CM | POA: Diagnosis not present

## 2011-06-05 DIAGNOSIS — R112 Nausea with vomiting, unspecified: Secondary | ICD-10-CM | POA: Diagnosis not present

## 2011-06-05 DIAGNOSIS — Z8249 Family history of ischemic heart disease and other diseases of the circulatory system: Secondary | ICD-10-CM | POA: Insufficient documentation

## 2011-06-05 MED ORDER — TECHNETIUM TC 99M TETROFOSMIN IV KIT
11.0000 | PACK | Freq: Once | INTRAVENOUS | Status: AC | PRN
  Administered 2011-06-05: 11 via INTRAVENOUS

## 2011-06-05 MED ORDER — TECHNETIUM TC 99M TETROFOSMIN IV KIT
33.0000 | PACK | Freq: Once | INTRAVENOUS | Status: AC | PRN
  Administered 2011-06-05: 33 via INTRAVENOUS

## 2011-06-05 MED ORDER — REGADENOSON 0.4 MG/5ML IV SOLN
0.4000 mg | Freq: Once | INTRAVENOUS | Status: AC
  Administered 2011-06-05: 0.4 mg via INTRAVENOUS

## 2011-06-05 NOTE — Progress Notes (Signed)
[facility] SITE 3 NUCLEAR MED [address] [place] [address] [phone]  Cardiology Nuclear Med Study  Steele Ledonne Cheramie is a 56 y.o. female [id number] 1956/03/14   Nuclear Med Background Indication for Stress Test:  Evaluation for Ischemia History:  Asthma and '09 Echo:normal; '09 Loop Recorder; '10 ION:GEXBMW, EF=69% Cardiac Risk Factors: Family History - CAD, History of Smoking, Hypertension and Lipids  Symptoms:  Chest Pain/Pressure with and without Exertion (last episode of chest discomfort was this am, 3/10; none now), Diaphoresis, Dizziness, DOE, Nausea/Vomiting, Syncope/Near Syncope, Palpitations and Rapid HR   Nuclear Pre-Procedure Caffeine/Decaff Intake:  None > 12 hrs NPO After: 8:30pm   Lungs:  Clear.  O2 SAT 98% on RA. IV 0.9% NS with Angio Cath:  22g  IV Site: R Forearm x 1, tolerated well IV Started by:  Irean Hong, RN  Chest Size (in):  38 Cup Size: DDD  Height: 5\' 2"  (1.575 m)  Weight:  143 lb (64.864 kg)  BMI:  Body mass index is 26.15 kg/(m^2). Tech Comments:  Atenolol held this am    Nuclear Med Study 1 or 2 day study: 1 day  Stress Test Type:  Eugenie Birks  Reading MD: Marca Ancona, MD  Order Authorizing Provider:  [name], MD  Resting Radionuclide: Technetium 31m Tetrofosmin  Resting Radionuclide Dose: 11.0 mCi   Stress Radionuclide:  Technetium 39m Tetrofosmin  Stress Radionuclide Dose: 33.0 mCi           Stress Protocol Rest HR: 57 Stress HR: 122  Rest BP: 124/75 Stress BP: 145/72  Exercise Time (min): n/a METS: n/a   Predicted Max HR: 166 bpm % Max HR: 73.49 bpm Rate Pressure Product: 41324   Dose of Adenosine (mg):  n/a Dose of Lexiscan: 0.4 mg  Dose of Atropine (mg): n/a Dose of Dobutamine: n/a mcg/kg/min (at max HR)  Stress Test Technologist: Smiley Houseman, CMA-N  Nuclear Technologist:  Domenic Polite, CNMT     Rest Procedure:  Myocardial perfusion imaging was performed at rest 45 minutes  following the intravenous administration of Technetium 29m Tetrofosmin.  Rest ECG: Sinus bradycardia.  Stress Procedure:  The patient received IV Lexiscan 0.4 mg over 15-seconds.  Technetium 7m Tetrofosmin injected at 30-seconds.  There were no significant changes with Lexiscan.  Quantitative spect images were obtained after a 45 minute delay.  Stress ECG: No significant change from baseline ECG  QPS Raw Data Images:  Normal; no motion artifact; normal heart/lung ratio. Stress Images:  Normal homogeneous uptake in all areas of the myocardium. Rest Images:  Normal homogeneous uptake in all areas of the myocardium. Subtraction (SDS):  There is no evidence of scar or ischemia. Transient Ischemic Dilatation (Normal <1.22):  1.02 Lung/Heart Ratio (Normal <0.45):  0.33  Quantitative Gated Spect Images QGS EDV:  52 ml QGS ESV:  14 ml QGS cine images:  Normal wall motion.  QGS EF: 73%  Impression Exercise Capacity:  Lexiscan with no exercise. BP Response:  Normal blood pressure response. Clinical Symptoms:  Lightheaded.  ECG Impression:  No significant ST segment change suggestive of ischemia. Comparison with Prior Nuclear Study: No significant change from previous study  Overall Impression:  Normal stress nuclear study.  Neeraj Housand [employer]

## 2011-06-12 ENCOUNTER — Telehealth: Payer: Self-pay | Admitting: Internal Medicine

## 2011-06-12 NOTE — Telephone Encounter (Signed)
Patient not at home, she can call back to get the results.  I let him know her test was normal

## 2011-06-12 NOTE — Telephone Encounter (Signed)
New Msg: Pt calling wanting to know results of pt recent stress test. Please return pt call to discuss further

## 2011-06-12 NOTE — Telephone Encounter (Signed)
Spoke with pt and gave her results of stress test 

## 2011-06-12 NOTE — Telephone Encounter (Signed)
FU call: Pt returning call from our office please return pt call to discuss further.

## 2011-06-13 DIAGNOSIS — M545 Low back pain, unspecified: Secondary | ICD-10-CM | POA: Diagnosis not present

## 2011-06-20 DIAGNOSIS — R05 Cough: Secondary | ICD-10-CM | POA: Diagnosis not present

## 2011-06-20 DIAGNOSIS — R059 Cough, unspecified: Secondary | ICD-10-CM | POA: Diagnosis not present

## 2011-06-20 DIAGNOSIS — J209 Acute bronchitis, unspecified: Secondary | ICD-10-CM | POA: Diagnosis not present

## 2011-07-14 DIAGNOSIS — I10 Essential (primary) hypertension: Secondary | ICD-10-CM | POA: Diagnosis not present

## 2011-07-14 DIAGNOSIS — R350 Frequency of micturition: Secondary | ICD-10-CM | POA: Diagnosis not present

## 2011-07-14 DIAGNOSIS — R252 Cramp and spasm: Secondary | ICD-10-CM | POA: Diagnosis not present

## 2011-07-24 DIAGNOSIS — M47817 Spondylosis without myelopathy or radiculopathy, lumbosacral region: Secondary | ICD-10-CM | POA: Diagnosis not present

## 2011-07-24 DIAGNOSIS — M545 Low back pain, unspecified: Secondary | ICD-10-CM | POA: Diagnosis not present

## 2011-07-25 ENCOUNTER — Other Ambulatory Visit: Payer: Self-pay | Admitting: Physician Assistant

## 2011-07-25 DIAGNOSIS — M4800 Spinal stenosis, site unspecified: Secondary | ICD-10-CM

## 2011-08-04 ENCOUNTER — Ambulatory Visit
Admission: RE | Admit: 2011-08-04 | Discharge: 2011-08-04 | Disposition: A | Discharge status: Home / Self Care | Payer: Medicare Other | Source: Ambulatory Visit | Attending: Physician Assistant | Admitting: Physician Assistant

## 2011-08-04 VITALS — BP 118/77 | HR 67

## 2011-08-04 DIAGNOSIS — M48061 Spinal stenosis, lumbar region without neurogenic claudication: Secondary | ICD-10-CM

## 2011-08-04 DIAGNOSIS — M5137 Other intervertebral disc degeneration, lumbosacral region: Secondary | ICD-10-CM | POA: Diagnosis not present

## 2011-08-04 DIAGNOSIS — M4800 Spinal stenosis, site unspecified: Secondary | ICD-10-CM

## 2011-08-04 DIAGNOSIS — F411 Generalized anxiety disorder: Secondary | ICD-10-CM

## 2011-08-04 DIAGNOSIS — M5126 Other intervertebral disc displacement, lumbar region: Secondary | ICD-10-CM | POA: Diagnosis not present

## 2011-08-04 MED ORDER — ONDANSETRON HCL 4 MG/2ML IJ SOLN
4.0000 mg | Freq: Once | INTRAMUSCULAR | Status: AC
  Administered 2011-08-04: 4 mg via INTRAMUSCULAR

## 2011-08-04 MED ORDER — DIAZEPAM 5 MG PO TABS
5.0000 mg | ORAL_TABLET | Freq: Once | ORAL | Status: AC
  Administered 2011-08-04: 5 mg via ORAL

## 2011-08-04 MED ORDER — HYDROMORPHONE HCL PF 1 MG/ML IJ SOLN
1.0000 mg | Freq: Once | INTRAMUSCULAR | Status: AC
  Administered 2011-08-04: 1 mg via INTRAMUSCULAR

## 2011-08-04 MED ORDER — DIAZEPAM 5 MG PO TABS
10.0000 mg | ORAL_TABLET | Freq: Once | ORAL | Status: AC
  Administered 2011-08-04: 10 mg via ORAL

## 2011-08-04 MED ORDER — IOHEXOL 180 MG/ML  SOLN
15.0000 mL | Freq: Once | INTRAMUSCULAR | Status: AC | PRN
  Administered 2011-08-04: 15 mL via INTRATHECAL

## 2011-08-04 MED ORDER — ONDANSETRON HCL 4 MG/2ML IJ SOLN: 4.0000 mg | Freq: Four times a day (QID) | INTRAMUSCULAR | Status: DC | PRN

## 2011-08-04 NOTE — Discharge Instructions (Signed)
Myelogram Discharge Instructions  1. Go home and rest quietly for the next 24 hours.  It is important to lie flat for the next 24 hours.  Get up only to go to the restroom.  You may lie in the bed or on a couch on your back, your stomach, your left side or your right side.  You may have one pillow under your head.  You may have pillows between your knees while you are on your side or under your knees while you are on your back.  2. DO NOT drive today.  Recline the seat as far back as it will go, while still wearing your seat belt, on the way home.  3. You may get up to go to the bathroom as needed.  You may sit up for 10 minutes to eat.  You may resume your normal diet and medications unless otherwise indicated.  Drink lots of extra fluids today and tomorrow.  4. The incidence of headache, nausea, or vomiting is about 5% (one in 20 patients).  If you develop a headache, lie flat and drink plenty of fluids until the headache goes away.  Caffeinated beverages may be helpful.  If you develop severe nausea and vomiting or a headache that does not go away with flat bed rest, call [phone].  5. You may resume normal activities after your 24 hours of bed rest is over; however, do not exert yourself strongly or do any heavy lifting tomorrow. If when you get up you have a headache when standing, go back to bed and force fluids for another 24 hours.  6. Call your physician for a follow-up appointment.  The results of your myelogram will be sent directly to your physician by the following day.  7. If you have any questions or if complications develop after you arrive home, please call [phone].  Discharge instructions have been explained to the patient.  The patient, or the person responsible for the patient, fully understands these instructions.       May resume tramadol and trazadone on Aug 05, 2011, after 9:30 am.

## 2011-08-04 NOTE — Progress Notes (Signed)
Patient states she has been off Trazadone and Tramadol for the past two days.  jkl

## 2011-08-05 DIAGNOSIS — I10 Essential (primary) hypertension: Secondary | ICD-10-CM | POA: Diagnosis not present

## 2011-08-05 DIAGNOSIS — S0003XA Contusion of scalp, initial encounter: Secondary | ICD-10-CM | POA: Diagnosis not present

## 2011-08-05 DIAGNOSIS — Z79899 Other long term (current) drug therapy: Secondary | ICD-10-CM | POA: Diagnosis not present

## 2011-08-05 DIAGNOSIS — Z23 Encounter for immunization: Secondary | ICD-10-CM | POA: Diagnosis not present

## 2011-08-05 DIAGNOSIS — S0180XA Unspecified open wound of other part of head, initial encounter: Secondary | ICD-10-CM | POA: Diagnosis not present

## 2011-08-05 DIAGNOSIS — J45909 Unspecified asthma, uncomplicated: Secondary | ICD-10-CM | POA: Diagnosis not present

## 2011-08-08 DIAGNOSIS — I10 Essential (primary) hypertension: Secondary | ICD-10-CM | POA: Diagnosis not present

## 2011-08-08 DIAGNOSIS — E785 Hyperlipidemia, unspecified: Secondary | ICD-10-CM | POA: Diagnosis not present

## 2011-08-11 DIAGNOSIS — Z1212 Encounter for screening for malignant neoplasm of rectum: Secondary | ICD-10-CM | POA: Diagnosis not present

## 2011-08-20 DIAGNOSIS — IMO0002 Reserved for concepts with insufficient information to code with codable children: Secondary | ICD-10-CM | POA: Diagnosis not present

## 2011-08-20 DIAGNOSIS — M48061 Spinal stenosis, lumbar region without neurogenic claudication: Secondary | ICD-10-CM | POA: Diagnosis not present

## 2011-08-27 DIAGNOSIS — M545 Low back pain, unspecified: Secondary | ICD-10-CM | POA: Diagnosis not present

## 2011-08-27 DIAGNOSIS — IMO0002 Reserved for concepts with insufficient information to code with codable children: Secondary | ICD-10-CM | POA: Diagnosis not present

## 2011-09-17 DIAGNOSIS — M545 Low back pain, unspecified: Secondary | ICD-10-CM | POA: Diagnosis not present

## 2011-09-17 DIAGNOSIS — IMO0002 Reserved for concepts with insufficient information to code with codable children: Secondary | ICD-10-CM | POA: Diagnosis not present

## 2011-09-23 ENCOUNTER — Ambulatory Visit: Payer: Medicare Other | Attending: Specialist | Admitting: Physical Therapy

## 2011-09-23 DIAGNOSIS — IMO0001 Reserved for inherently not codable concepts without codable children: Secondary | ICD-10-CM | POA: Diagnosis not present

## 2011-09-23 DIAGNOSIS — M545 Low back pain, unspecified: Secondary | ICD-10-CM | POA: Diagnosis not present

## 2011-09-23 DIAGNOSIS — M546 Pain in thoracic spine: Secondary | ICD-10-CM | POA: Insufficient documentation

## 2011-09-23 DIAGNOSIS — R293 Abnormal posture: Secondary | ICD-10-CM | POA: Diagnosis not present

## 2011-09-23 DIAGNOSIS — R5381 Other malaise: Secondary | ICD-10-CM | POA: Diagnosis not present

## 2011-09-25 ENCOUNTER — Ambulatory Visit: Payer: Medicare Other | Admitting: Physical Therapy

## 2011-09-25 ENCOUNTER — Encounter: Payer: Self-pay | Admitting: Physician Assistant

## 2011-09-25 DIAGNOSIS — Z79899 Other long term (current) drug therapy: Secondary | ICD-10-CM | POA: Diagnosis not present

## 2011-09-25 DIAGNOSIS — R002 Palpitations: Secondary | ICD-10-CM | POA: Diagnosis not present

## 2011-09-25 DIAGNOSIS — F411 Generalized anxiety disorder: Secondary | ICD-10-CM | POA: Diagnosis not present

## 2011-09-25 DIAGNOSIS — M549 Dorsalgia, unspecified: Secondary | ICD-10-CM | POA: Diagnosis not present

## 2011-09-25 DIAGNOSIS — G8929 Other chronic pain: Secondary | ICD-10-CM | POA: Diagnosis not present

## 2011-09-25 DIAGNOSIS — R079 Chest pain, unspecified: Secondary | ICD-10-CM | POA: Diagnosis not present

## 2011-09-25 DIAGNOSIS — I10 Essential (primary) hypertension: Secondary | ICD-10-CM | POA: Diagnosis not present

## 2011-09-30 ENCOUNTER — Ambulatory Visit (INDEPENDENT_AMBULATORY_CARE_PROVIDER_SITE_OTHER): Payer: Medicare Other | Admitting: Physician Assistant

## 2011-09-30 ENCOUNTER — Encounter: Payer: Self-pay | Admitting: Physician Assistant

## 2011-09-30 ENCOUNTER — Ambulatory Visit: Payer: Medicare Other | Attending: Specialist | Admitting: Physical Therapy

## 2011-09-30 VITALS — BP 120/80 | HR 58 | Ht 62.0 in | Wt 130.0 lb

## 2011-09-30 DIAGNOSIS — K219 Gastro-esophageal reflux disease without esophagitis: Secondary | ICD-10-CM

## 2011-09-30 DIAGNOSIS — M546 Pain in thoracic spine: Secondary | ICD-10-CM | POA: Diagnosis not present

## 2011-09-30 DIAGNOSIS — M545 Low back pain, unspecified: Secondary | ICD-10-CM | POA: Insufficient documentation

## 2011-09-30 DIAGNOSIS — R293 Abnormal posture: Secondary | ICD-10-CM | POA: Diagnosis not present

## 2011-09-30 DIAGNOSIS — I10 Essential (primary) hypertension: Secondary | ICD-10-CM

## 2011-09-30 DIAGNOSIS — R079 Chest pain, unspecified: Secondary | ICD-10-CM | POA: Diagnosis not present

## 2011-09-30 DIAGNOSIS — R5381 Other malaise: Secondary | ICD-10-CM | POA: Diagnosis not present

## 2011-09-30 DIAGNOSIS — R55 Syncope and collapse: Secondary | ICD-10-CM

## 2011-09-30 DIAGNOSIS — E785 Hyperlipidemia, unspecified: Secondary | ICD-10-CM

## 2011-09-30 DIAGNOSIS — IMO0001 Reserved for inherently not codable concepts without codable children: Secondary | ICD-10-CM | POA: Diagnosis not present

## 2011-09-30 DIAGNOSIS — I2510 Atherosclerotic heart disease of native coronary artery without angina pectoris: Secondary | ICD-10-CM | POA: Diagnosis not present

## 2011-09-30 NOTE — Addendum Note (Signed)
Addended byAlben Spittle, Lorin Picket T on: 09/30/2011 09:47 PM   Modules accepted: Orders

## 2011-09-30 NOTE — Progress Notes (Addendum)
[address] [place], [place]  [address] Phone: [phone] Fax:  [phone]  Date:  09/30/2011   Name:  [name]   DOB:  10/18/1955   MRN:  [id number]  PCP:  LONG,Steele Stracener, PA  Primary Cardiologist/Primary Electrophysiologist:  Dr. Gregg Taylor    History of Present Illness: [name] is a 55 y.o. female who returns for post hospital follow up.  She has a history of unexplained syncope, status post implantable loop recorder.  She's had no bradycardic events.  She has had sinus tachycardia and questionable SVT.  She has a reported history of CAD.  Initially seen by Dr. Ross in 04/2007.  Apparently records were requested.  I have her paper chart and do not find a record of a cardiac catheterization.  Echo 04/2007: Normal LV function.  Last seen by Dr. Taylor 05/2011.  She was complaining of chest pain and a Lexiscan Myoview was obtained 06/05/11: Normal, no scar or ischemia, EF 73%.  She was in the ED at [facility] 09/26/11 with complaints of chest pain.  Chest x-ray was reportedly normal.  ECG was nonacute.  Labs: K 3.9, BUN 14, creatinine 0.5, BNP 46, d-dimer 0.28 (negative), hgb 12.6, cardiac markers remained normal.  Her case was apparently reviewed with the on-call cardiologist and she was discharged home from the emergency room with plans for followup today.  Patient reports chest pain off and on since she had a heart attack.  States she has memory issues.  Cannot remember when she had the MI.  States it was at [facility].  Denies having blockages.  Denies having PCI.  CP is not exertional.  Denies assoc SOB.  NTG helps relieve symptoms.  No orthopnea, PND or edema.  Reports recurrent syncope about 1-2 weeks ago.  This is typical for her.  Still having chest pain.  States her CP last week was worse.  This CP is her baseline symptom.   Overall, CP seems to be getting worse.     Past Medical History  Diagnosis Date  . Anxiety   .  Herpes simplex infection   . Osteoporosis   . Sleep apnea   . Hypertension   . Dyslipidemia   . Depression   . Asthma   . Fibromyalgia   . GERD (gastroesophageal reflux disease)   . Head injury, unspecified   . Supraventricular tachycardia   . Syncope and collapse     s/p ILR; no arhythmogenic cause identified  . Coronary artery disease     reported hx of "MI";  Echo 2009 with normal LVF;  Myoview 05/2011: no ischemia  . Spondylolisthesis   . Spinal stenosis of lumbar region     Current Outpatient Prescriptions  Medication Sig Dispense Refill  . albuterol (PROAIR HFA) 108 (90 BASE) MCG/ACT inhaler Inhale 2 puffs into the lungs every 6 (six) hours as needed.        . ALPRAZolam (XANAX) 1 MG tablet Take 1 mg by mouth 3 (three) times daily as needed.       . atenolol (TENORMIN) 25 MG tablet Take 1 tablet (25 mg total) by mouth 2 (two) times daily.  60 tablet  11  . diclofenac (VOLTAREN) 50 MG EC tablet Take 50 mg by mouth 3 (three) times daily.        . diclofenac sodium (VOLTAREN) 1 % GEL Apply topically as needed.        . hydrochlorothiazide (HYDRODIURIL) 12.5 MG tablet   Take 12.5 mg by mouth daily.        . niacin (NIASPAN) 500 MG CR tablet Take 500 mg by mouth at bedtime.      . nitroGLYCERIN (NITROSTAT) 0.4 MG SL tablet Place 0.4 mg under the tongue every 5 (five) minutes as needed.        . pantoprazole (PROTONIX) 40 MG tablet Take 40 mg by mouth daily.        . rosuvastatin (CRESTOR) 20 MG tablet Take 20 mg by mouth daily.      . traMADol (ULTRAM) 50 MG tablet Take 50 mg by mouth every 6 (six) hours as needed.        . traZODone (DESYREL) 100 MG tablet Take 200 mg by mouth daily.        . valACYclovir (VALTREX) 500 MG tablet Take 500 mg by mouth 2 (two) times daily.          Allergies: Allergies  Allergen Reactions  . Codeine Other (See Comments)    "I will have a heart attack."  . Morphine And Related Other (See Comments)    "It will cause me to have a heart attack."     History  Substance Use Topics  . Smoking status: Never Smoker   . Smokeless tobacco: Never Used  . Alcohol Use: No     Family History  Problem Relation Age of Onset  . Heart attack Father   . Heart attack Brother     stents     ROS:  Please see the history of present illness.     All other systems reviewed and negative.   PHYSICAL EXAM: VS:  BP 120/80  Pulse 58  Ht 5' 2" (1.575 m)  Wt 130 lb (58.968 kg)  BMI 23.78 kg/m2 Well nourished, well developed, in no acute distress HEENT: normal Neck: no JVD Endo: no thyromegaly Cardiac:  normal S1, S2; RRR; no murmur Lungs:  clear to auscultation bilaterally, no wheezing, rhonchi or rales Abd: soft, nontender, no hepatomegaly Ext: no edema Skin: warm and dry Neuro:  CNs 2-12 intact, no focal abnormalities noted  EKG:  Sinus bradycardia, rate 58, normal axis, nonspecific ST-T wave changes      ASSESSMENT AND PLAN:  1.  Chest Pain Atypical and typical features. Reported hx of MI without known coronary anatomy. Negative Myoview in 05/2011.   Symptoms seem to be getting worse.  Now, prompting visits to the ED. She has several CAD risk factors including strong FHx of CAD. Long d/w patient regarding further evaluation to include cardiac cath to define anatomy. She is agreeable to cath.  D/w Dr.  Thomas Stuckey (DOD) who also agreed. Risks and benefits of cardiac catheterization have been discussed with the patient.  These include bleeding, infection, kidney damage, stroke, heart attack, death.  The patient understands these risks and is willing to proceed.  Will arrange in outpatient lab.  2.  Syncope Unexplained. No significant arrhythmias noted in past.  She has normal LVF.  3.  Hypertension Controlled.  Continue current therapy.   4.  Hyperlipidemia Managed by PCP.   5.  GERD If she has no obstructive CAD on cath, would consider pursuing further GI evaluation to assess a cause for chest pain.   Signed, Bryann Mcnealy, PA-C  4:06 PM 09/30/2011    

## 2011-09-30 NOTE — Patient Instructions (Addendum)
Your physician has requested that you have a LEFT cardiac catheterization. Cardiac catheterization is used to diagnose and/or treat various heart conditions. Doctors may recommend this procedure for a number of different reasons. The most common reason is to evaluate chest pain. Chest pain can be a symptom of coronary artery disease (CAD), and cardiac catheterization can show whether plaque is narrowing or blocking your heart's arteries. This procedure is also used to evaluate the valves, as well as measure the blood flow and oxygen levels in different parts of your heart. For further information please visit [email or web address] Please follow instruction sheet, as given. I WILL CALL YOU 10/01/11 WITH THE DATE AND TIME OF YOUR CATH.  YOU WILL NEED TO COME IN FOR PRE CATH LAB WORK, I WILL LET YOU WHEN TO COME IN FOR THE LAB WORK

## 2011-10-01 ENCOUNTER — Ambulatory Visit: Payer: Medicare Other | Admitting: Physical Therapy

## 2011-10-01 ENCOUNTER — Other Ambulatory Visit (INDEPENDENT_AMBULATORY_CARE_PROVIDER_SITE_OTHER): Payer: Medicare Other

## 2011-10-01 ENCOUNTER — Encounter: Payer: Self-pay | Admitting: *Deleted

## 2011-10-01 ENCOUNTER — Other Ambulatory Visit: Payer: Self-pay | Admitting: *Deleted

## 2011-10-01 DIAGNOSIS — I2510 Atherosclerotic heart disease of native coronary artery without angina pectoris: Secondary | ICD-10-CM | POA: Diagnosis not present

## 2011-10-01 LAB — BASIC METABOLIC PANEL
BUN: 11 mg/dL (ref 6–23)
Calcium: 9.5 mg/dL (ref 8.4–10.5)
GFR: 80.33 mL/min (ref >60.00)
Glucose, Bld: 98 mg/dL (ref 70–99)
Potassium: 3.7 mEq/L (ref 3.5–5.1)

## 2011-10-01 LAB — CBC WITH DIFFERENTIAL/PLATELET
Basophils Absolute: 0 10*3/uL (ref 0.0–0.1)
Eosinophils Absolute: 0.1 10*3/uL (ref 0.0–0.7)
HCT: 41.5 % (ref 36.0–46.0)
Lymphocytes Relative: 45.5 % (ref 12.0–46.0)
Lymphs Abs: 2.5 10*3/uL (ref 0.7–4.0)
MCHC: 32.9 g/dL (ref 30.0–36.0)
Monocytes Relative: 12.2 % — ABNORMAL HIGH (ref 3.0–12.0)
Neutro Abs: 2.2 10*3/uL (ref 1.4–7.7)
Platelets: 197 10*3/uL (ref 150.0–400.0)
RDW: 14.6 % (ref 11.5–14.6)

## 2011-10-01 LAB — PROTIME-INR: Prothrombin Time: 12.4 s (ref 10.2–12.4)

## 2011-10-03 NOTE — H&P (Signed)
History and Physical    Date:  09/30/2011   Name:  [name]   DOB:  07/11/1955   MRN:  [id number]  PCP:  Lindaann Pascal, PA  Primary Cardiologist/Primary Electrophysiologist:  Dr. [name]    History of Present Illness: [name] is a 56 y.o. female who returns for post hospital follow up.  She has a history of unexplained syncope, status post implantable loop recorder.  She's had no bradycardic events.  She has had sinus tachycardia and questionable SVT.  She has a reported history of CAD.  Initially seen by Dr. Tenny Craw in 04/2007.  Apparently records were requested.  I have her paper chart and do not find a record of a cardiac catheterization.  Echo 04/2007: Normal LV function.  Last seen by Dr. Ladona Ridgel 05/2011.  She was complaining of chest pain and a Lexiscan Myoview was obtained 06/05/11: Normal, no scar or ischemia, EF 73%.  She was in the ED at [facility] 09/26/11 with complaints of chest pain.  Chest x-ray was reportedly normal.  ECG was nonacute.  Labs: K 3.9, BUN 14, creatinine 0.5, BNP 46, d-dimer 0.28 (negative), hgb 12.6, cardiac markers remained normal.  Her case was apparently reviewed with the on-call cardiologist and she was discharged home from the emergency room with plans for followup today.  Patient reports chest pain off and on since she had a heart attack.  States she has memory issues.  Cannot remember when she had the MI.  States it was at [facility].  Denies having blockages.  Denies having PCI.  CP is not exertional.  Denies assoc SOB.  NTG helps relieve symptoms.  No orthopnea, PND or edema.  Reports recurrent syncope about 1-2 weeks ago.  This is typical for her.  Still having chest pain.  States her CP last week was worse.  This CP is her baseline symptom.   Overall, CP seems to be getting worse.     Past Medical History  Diagnosis Date  . Anxiety   . Herpes simplex infection   . Osteoporosis   . Sleep apnea   . Hypertension   .  Dyslipidemia   . Depression   . Asthma   . Fibromyalgia   . GERD (gastroesophageal reflux disease)   . Head injury, unspecified   . Supraventricular tachycardia   . Syncope and collapse     s/p ILR; no arhythmogenic cause identified  . Coronary artery disease     reported hx of "MI";  Echo 2009 with normal LVF;  Myoview 05/2011: no ischemia  . Spondylolisthesis   . Spinal stenosis of lumbar region     Current Outpatient Prescriptions  Medication Sig Dispense Refill  . albuterol (PROAIR HFA) 108 (90 BASE) MCG/ACT inhaler Inhale 2 puffs into the lungs every 6 (six) hours as needed.        . ALPRAZolam (XANAX) 1 MG tablet Take 1 mg by mouth 3 (three) times daily as needed.       [name] atenolol (TENORMIN) 25 MG tablet Take 1 tablet (25 mg total) by mouth 2 (two) times daily.  60 tablet  11  . diclofenac (VOLTAREN) 50 MG EC tablet Take 50 mg by mouth 3 (three) times daily.        . diclofenac sodium (VOLTAREN) 1 % GEL Apply topically as needed.        . hydrochlorothiazide (HYDRODIURIL) 12.5 MG tablet Take 12.5 mg by mouth daily.        [name]  niacin (NIASPAN) 500 MG CR tablet Take 500 mg by mouth at bedtime.      . nitroGLYCERIN (NITROSTAT) 0.4 MG SL tablet Place 0.4 mg under the tongue every 5 (five) minutes as needed.        . pantoprazole (PROTONIX) 40 MG tablet Take 40 mg by mouth daily.        . rosuvastatin (CRESTOR) 20 MG tablet Take 20 mg by mouth daily.      . traMADol (ULTRAM) 50 MG tablet Take 50 mg by mouth every 6 (six) hours as needed.        . traZODone (DESYREL) 100 MG tablet Take 200 mg by mouth daily.        . valACYclovir (VALTREX) 500 MG tablet Take 500 mg by mouth 2 (two) times daily.          Allergies: Allergies  Allergen Reactions  . Codeine Other (See Comments)    "I will have a heart attack."  . Morphine And Related Other (See Comments)    "It will cause me to have a heart attack."    History  Substance Use Topics  . Smoking status: Never Smoker   . Smokeless  tobacco: Never Used  . Alcohol Use: No     Family History  Problem Relation Age of Onset  . Heart attack Father   . Heart attack Brother     stents     ROS:  Please see the history of present illness.     All other systems reviewed and negative.   PHYSICAL EXAM: VS:  BP 120/80  Pulse 58  Ht 5\' 2"  (1.575 m)  Wt 130 lb (58.968 kg)  BMI 23.78 kg/m2 Well nourished, well developed, in no acute distress HEENT: normal Neck: no JVD Endo: no thyromegaly Cardiac:  normal S1, S2; RRR; no murmur Lungs:  clear to auscultation bilaterally, no wheezing, rhonchi or rales Abd: soft, nontender, no hepatomegaly Ext: no edema Skin: warm and dry Neuro:  CNs 2-12 intact, no focal abnormalities noted  EKG:  Sinus bradycardia, rate 58, normal axis, nonspecific ST-T wave changes      ASSESSMENT AND PLAN:  1.  Chest Pain Atypical and typical features. Reported hx of MI without known coronary anatomy. Negative Myoview in 05/2011.   Symptoms seem to be getting worse.  Now, prompting visits to the ED. She has several CAD risk factors including strong FHx of CAD. Long d/w patient regarding further evaluation to include cardiac cath to define anatomy. She is agreeable to cath.  D/w Dr.  Shawnie Pons (DOD) who also agreed. Risks and benefits of cardiac catheterization have been discussed with the patient.  These include bleeding, infection, kidney damage, stroke, heart attack, death.  The patient understands these risks and is willing to proceed.  Will arrange in outpatient lab.  2.  Syncope Unexplained. No significant arrhythmias noted in past.  She has normal LVF.  3.  Hypertension Controlled.  Continue current therapy.   4.  Hyperlipidemia Managed by PCP.   5.  GERD If she has no obstructive CAD on cath, would consider pursuing further GI evaluation to assess a cause for chest pain.   Signed, Tereso Newcomer, PA-C  4:06 PM 09/30/2011

## 2011-10-03 NOTE — Addendum Note (Signed)
Addended byAlben Spittle, Lorin Picket T on: 10/03/2011 02:24 PM   Modules accepted: Orders

## 2011-10-03 NOTE — Addendum Note (Signed)
Addended by: Verne Carrow D on: 10/03/2011 02:41 PM   Modules accepted: Orders

## 2011-10-06 ENCOUNTER — Inpatient Hospital Stay (HOSPITAL_BASED_OUTPATIENT_CLINIC_OR_DEPARTMENT_OTHER)
Admission: RE | Admit: 2011-10-06 | Discharge: 2011-10-06 | Disposition: A | Discharge status: Home / Self Care | Payer: Medicare Other | Source: Ambulatory Visit | Attending: Cardiovascular Disease | Admitting: Cardiovascular Disease

## 2011-10-06 ENCOUNTER — Encounter (HOSPITAL_BASED_OUTPATIENT_CLINIC_OR_DEPARTMENT_OTHER)
Admission: RE | Disposition: A | Discharge status: Home / Self Care | Payer: Self-pay | Source: Ambulatory Visit | Attending: Cardiovascular Disease

## 2011-10-06 DIAGNOSIS — R079 Chest pain, unspecified: Secondary | ICD-10-CM

## 2011-10-06 DIAGNOSIS — R0789 Other chest pain: Secondary | ICD-10-CM | POA: Diagnosis not present

## 2011-10-06 HISTORY — PX: CARDIAC CATHETERIZATION: SHX172

## 2011-10-06 SURGERY — JV LEFT HEART CATHETERIZATION WITH CORONARY ANGIOGRAM
Anesthesia: Moderate Sedation

## 2011-10-06 MED ORDER — DIAZEPAM 5 MG PO TABS
5.0000 mg | ORAL_TABLET | ORAL | Status: AC
  Administered 2011-10-06: 5 mg via ORAL

## 2011-10-06 MED ORDER — SODIUM CHLORIDE 0.9 % IJ SOLN: 3.0000 mL | INTRAMUSCULAR | Status: DC | PRN

## 2011-10-06 MED ORDER — SODIUM CHLORIDE 0.9 % IV SOLN: 250.0000 mL | INTRAVENOUS | Status: DC | PRN

## 2011-10-06 MED ORDER — SODIUM CHLORIDE 0.9 % IV SOLN: INTRAVENOUS | Status: DC

## 2011-10-06 MED ORDER — SODIUM CHLORIDE 0.9 % IV SOLN
INTRAVENOUS | Status: DC
  Administered 2011-10-06: 11:00:00 via INTRAVENOUS

## 2011-10-06 MED ORDER — SODIUM CHLORIDE 0.9 % IJ SOLN: 3.0000 mL | Freq: Two times a day (BID) | INTRAMUSCULAR | Status: DC

## 2011-10-06 MED ORDER — ACETAMINOPHEN 325 MG PO TABS: 650.0000 mg | ORAL_TABLET | ORAL | Status: DC | PRN

## 2011-10-06 MED ORDER — ASPIRIN 81 MG PO CHEW
324.0000 mg | CHEWABLE_TABLET | ORAL | Status: AC
  Administered 2011-10-06: 324 mg via ORAL

## 2011-10-06 MED ORDER — SODIUM CHLORIDE 0.9 % IV SOLN: INTRAVENOUS | Status: AC

## 2011-10-06 MED ORDER — ASPIRIN 81 MG PO CHEW: 324.0000 mg | CHEWABLE_TABLET | ORAL | Status: DC

## 2011-10-06 NOTE — Interval H&P Note (Signed)
History and Physical Interval Note:  10/06/2011 12:05 PM  [facility] Short  has presented today for surgery, with the diagnosis of cp  The various methods of treatment have been discussed with the patient and family. After consideration of risks, benefits and other options for treatment, the patient has consented to  Procedure(s) (LRB): JV LEFT HEART CATHETERIZATION WITH CORONARY ANGIOGRAM (N/A) as a surgical intervention .  The patient's history has been reviewed, patient examined, no change in status, stable for surgery.  I have reviewed the patients' chart and labs.  Questions were answered to the patient's satisfaction.     MCALHANY,CHRISTOPHER

## 2011-10-06 NOTE — Progress Notes (Signed)
Bedrest begins @ 1300, tegaderm dressing applied to right groin site by Venda Rodes.

## 2011-10-06 NOTE — H&P (View-Only) (Signed)
[address] [place], [place]  [address] Phone: [phone] Fax:  [phone]  Date:  09/30/2011   Name:  [name]   DOB:  1955-06-25   MRN:  [id number]  PCP:  Lindaann Pascal, PA  Primary Cardiologist/Primary Electrophysiologist:  Dr. [name]    History of Present Illness: [name] is a 56 y.o. female who returns for post hospital follow up.  She has a history of unexplained syncope, status post implantable loop recorder.  She's had no bradycardic events.  She has had sinus tachycardia and questionable SVT.  She has a reported history of CAD.  Initially seen by Dr. Tenny Craw in 04/2007.  Apparently records were requested.  I have her paper chart and do not find a record of a cardiac catheterization.  Echo 04/2007: Normal LV function.  Last seen by Dr. Ladona Ridgel 05/2011.  She was complaining of chest pain and a Lexiscan Myoview was obtained 06/05/11: Normal, no scar or ischemia, EF 73%.  She was in the ED at [facility] 09/26/11 with complaints of chest pain.  Chest x-ray was reportedly normal.  ECG was nonacute.  Labs: K 3.9, BUN 14, creatinine 0.5, BNP 46, d-dimer 0.28 (negative), hgb 12.6, cardiac markers remained normal.  Her case was apparently reviewed with the on-call cardiologist and she was discharged home from the emergency room with plans for followup today.  Patient reports chest pain off and on since she had a heart attack.  States she has memory issues.  Cannot remember when she had the MI.  States it was at [facility].  Denies having blockages.  Denies having PCI.  CP is not exertional.  Denies assoc SOB.  NTG helps relieve symptoms.  No orthopnea, PND or edema.  Reports recurrent syncope about 1-2 weeks ago.  This is typical for her.  Still having chest pain.  States her CP last week was worse.  This CP is her baseline symptom.   Overall, CP seems to be getting worse.     Past Medical History  Diagnosis Date  . Anxiety   .  Herpes simplex infection   . Osteoporosis   . Sleep apnea   . Hypertension   . Dyslipidemia   . Depression   . Asthma   . Fibromyalgia   . GERD (gastroesophageal reflux disease)   . Head injury, unspecified   . Supraventricular tachycardia   . Syncope and collapse     s/p ILR; no arhythmogenic cause identified  . Coronary artery disease     reported hx of "MI";  Echo 2009 with normal LVF;  Myoview 05/2011: no ischemia  . Spondylolisthesis   . Spinal stenosis of lumbar region     Current Outpatient Prescriptions  Medication Sig Dispense Refill  . albuterol (PROAIR HFA) 108 (90 BASE) MCG/ACT inhaler Inhale 2 puffs into the lungs every 6 (six) hours as needed.        . ALPRAZolam (XANAX) 1 MG tablet Take 1 mg by mouth 3 (three) times daily as needed.       [name] atenolol (TENORMIN) 25 MG tablet Take 1 tablet (25 mg total) by mouth 2 (two) times daily.  60 tablet  11  . diclofenac (VOLTAREN) 50 MG EC tablet Take 50 mg by mouth 3 (three) times daily.        . diclofenac sodium (VOLTAREN) 1 % GEL Apply topically as needed.        . hydrochlorothiazide (HYDRODIURIL) 12.5 MG tablet  Take 12.5 mg by mouth daily.        . niacin (NIASPAN) 500 MG CR tablet Take 500 mg by mouth at bedtime.      . nitroGLYCERIN (NITROSTAT) 0.4 MG SL tablet Place 0.4 mg under the tongue every 5 (five) minutes as needed.        . pantoprazole (PROTONIX) 40 MG tablet Take 40 mg by mouth daily.        . rosuvastatin (CRESTOR) 20 MG tablet Take 20 mg by mouth daily.      . traMADol (ULTRAM) 50 MG tablet Take 50 mg by mouth every 6 (six) hours as needed.        . traZODone (DESYREL) 100 MG tablet Take 200 mg by mouth daily.        . valACYclovir (VALTREX) 500 MG tablet Take 500 mg by mouth 2 (two) times daily.          Allergies: Allergies  Allergen Reactions  . Codeine Other (See Comments)    "I will have a heart attack."  . Morphine And Related Other (See Comments)    "It will cause me to have a heart attack."     History  Substance Use Topics  . Smoking status: Never Smoker   . Smokeless tobacco: Never Used  . Alcohol Use: No     Family History  Problem Relation Age of Onset  . Heart attack Father   . Heart attack Brother     stents     ROS:  Please see the history of present illness.     All other systems reviewed and negative.   PHYSICAL EXAM: VS:  BP 120/80  Pulse 58  Ht 5\' 2"  (1.575 m)  Wt 130 lb (58.968 kg)  BMI 23.78 kg/m2 Well nourished, well developed, in no acute distress HEENT: normal Neck: no JVD Endo: no thyromegaly Cardiac:  normal S1, S2; RRR; no murmur Lungs:  clear to auscultation bilaterally, no wheezing, rhonchi or rales Abd: soft, nontender, no hepatomegaly Ext: no edema Skin: warm and dry Neuro:  CNs 2-12 intact, no focal abnormalities noted  EKG:  Sinus bradycardia, rate 58, normal axis, nonspecific ST-T wave changes      ASSESSMENT AND PLAN:  1.  Chest Pain Atypical and typical features. Reported hx of MI without known coronary anatomy. Negative Myoview in 05/2011.   Symptoms seem to be getting worse.  Now, prompting visits to the ED. She has several CAD risk factors including strong FHx of CAD. Long d/w patient regarding further evaluation to include cardiac cath to define anatomy. She is agreeable to cath.  D/w Dr.  Shawnie Pons (DOD) who also agreed. Risks and benefits of cardiac catheterization have been discussed with the patient.  These include bleeding, infection, kidney damage, stroke, heart attack, death.  The patient understands these risks and is willing to proceed.  Will arrange in outpatient lab.  2.  Syncope Unexplained. No significant arrhythmias noted in past.  She has normal LVF.  3.  Hypertension Controlled.  Continue current therapy.   4.  Hyperlipidemia Managed by PCP.   5.  GERD If she has no obstructive CAD on cath, would consider pursuing further GI evaluation to assess a cause for chest pain.   Signed, Tereso Newcomer, PA-C  4:06 PM 09/30/2011

## 2011-10-06 NOTE — CV Procedure (Signed)
   Cardiac Catheterization Operative Report  [name] [id number] 7/8/201312:39 PM LONG,SCOTT, PA  Procedure Performed:  1. Left Heart Catheterization 2. Selective Coronary Angiography 3. Left ventricular angiogram  Operator: Verne Carrow, MD  Indication:  Chest pain concerning for unstable angina                   Procedure Details: The risks, benefits, complications, treatment options, and expected outcomes were discussed with the patient. The patient and/or family concurred with the proposed plan, giving informed consent. The patient was brought to the outpatient cath lab after IV hydration was begun and oral premedication was given. The patient was further sedated with Versed and Fentanyl. The right groin was prepped and draped in the usual manner. Using the modified Seldinger access technique, a 4 French sheath was placed in the right femoral artery. Standard diagnostic catheters were used to perform selective coronary angiography. A pigtail catheter was used to perform a left ventricular angiogram.  There were no immediate complications. The patient was taken to the recovery area in stable condition.   Hemodynamic Findings: Central aortic pressure: 108/58 Left ventricular pressure: 107/10/14  Angiographic Findings:  Left main: No obstructive disease noted.   Left Anterior Descending Artery: Moderate to large sized vessel that courses to the apex. There is a moderate sized diagonal branch. No obstructive disease noted.   Circumflex Artery: Large, dominant artery with moderate sized first obtuse marginal branch and left sided posterolateral branch with no disease noted.   Right Coronary Artery: Small, non-dominant vessel with no disease noted.   Left Ventricular Angiogram:LVEF=65%.   Impression: 1. No angiographic evidence of CAD 2. Normal LV systolic function 3. Non-cardiac chest pain  Recommendations: No further ischemic workup.          Complications:  None. The patient tolerated the procedure well.

## 2011-10-08 DIAGNOSIS — K224 Dyskinesia of esophagus: Secondary | ICD-10-CM | POA: Diagnosis not present

## 2011-10-08 DIAGNOSIS — I208 Other forms of angina pectoris: Secondary | ICD-10-CM | POA: Diagnosis not present

## 2011-10-15 ENCOUNTER — Ambulatory Visit: Payer: Medicare Other | Admitting: Physical Therapy

## 2011-10-16 ENCOUNTER — Ambulatory Visit: Payer: Medicare Other | Admitting: Physical Therapy

## 2011-10-17 ENCOUNTER — Ambulatory Visit: Payer: Medicare Other | Admitting: Physical Therapy

## 2011-10-21 ENCOUNTER — Ambulatory Visit: Payer: Medicare Other | Admitting: Physical Therapy

## 2011-10-22 ENCOUNTER — Ambulatory Visit (INDEPENDENT_AMBULATORY_CARE_PROVIDER_SITE_OTHER): Payer: Medicare Other | Admitting: Physician Assistant

## 2011-10-22 ENCOUNTER — Encounter: Payer: Self-pay | Admitting: Physician Assistant

## 2011-10-22 VITALS — BP 121/74 | Ht 62.0 in | Wt 134.8 lb

## 2011-10-22 DIAGNOSIS — R079 Chest pain, unspecified: Secondary | ICD-10-CM | POA: Diagnosis not present

## 2011-10-22 DIAGNOSIS — I498 Other specified cardiac arrhythmias: Secondary | ICD-10-CM

## 2011-10-22 DIAGNOSIS — I471 Supraventricular tachycardia: Secondary | ICD-10-CM

## 2011-10-22 DIAGNOSIS — R000 Tachycardia, unspecified: Secondary | ICD-10-CM | POA: Insufficient documentation

## 2011-10-22 NOTE — Assessment & Plan Note (Signed)
Patient has long history of chest pain relieved with nitroglycerin. Cardiac enzymes were negative and cardiac catheterization showed normal LV function and normal coronary arteries. She also had a normal Myoview in 3/13. She is not had chest pain since her cardiac catheterization.

## 2011-10-22 NOTE — Patient Instructions (Addendum)
Your physician recommends that you schedule a follow-up appointment in: 2 months with Dr Taylor  Your physician recommends that you continue on your current medications as directed. Please refer to the Current Medication list given to you today.   

## 2011-10-22 NOTE — Progress Notes (Signed)
HPI:  This is a 56 year old female patient who is here post hospital followup after undergoing cardiac for chest pain. Cardiac catheterization revealed normal coronary arteries and normal LV function. Cardiac enzymes were negative. No further ischemic workup was recommended. She has not had chest pain since her cardiac catheterization. She has history of unexplained syncope, status post implantable loop recorder. She has had no bradycardia events. She does have sinus tachycardia and questionable SVT.   Allergies:  -- Codeine -- Other (See Comments)   --  "I will have a heart attack."  -- Morphine And Related -- Other (See Comments)   --  "It will cause me to have a heart attack."  Current Outpatient Prescriptions on File Prior to Visit: albuterol (PROAIR HFA) 108 (90 BASE) MCG/ACT inhaler, Inhale 2 puffs into the lungs every 6 (six) hours as needed.  , Disp: , Rfl:  ALPRAZolam (XANAX) 1 MG tablet, Take 1 mg by mouth 4 (four) times daily as needed. , Disp: , Rfl:  atenolol (TENORMIN) 25 MG tablet, Take 1 tablet (25 mg total) by mouth 2 (two) times daily., Disp: 60 tablet, Rfl: 11 diclofenac (VOLTAREN) 50 MG EC tablet, Take 50 mg by mouth 3 (three) times daily.  , Disp: , Rfl:  diclofenac sodium (VOLTAREN) 1 % GEL, Apply topically as needed.  , Disp: , Rfl:  hydrochlorothiazide (HYDRODIURIL) 12.5 MG tablet, Take 12.5 mg by mouth daily.  , Disp: , Rfl:  niacin (NIASPAN) 500 MG CR tablet, Take 500 mg by mouth at bedtime., Disp: , Rfl:  nitroGLYCERIN (NITROSTAT) 0.4 MG SL tablet, Place 0.4 mg under the tongue every 5 (five) minutes as needed.  , Disp: , Rfl:  pantoprazole (PROTONIX) 40 MG tablet, Take 40 mg by mouth daily.  , Disp: , Rfl:  rosuvastatin (CRESTOR) 20 MG tablet, Take 20 mg by mouth daily., Disp: , Rfl:  traMADol (ULTRAM) 50 MG tablet, Take 50 mg by mouth every 6 (six) hours as needed.  , Disp: , Rfl:  traZODone (DESYREL) 100 MG tablet, Take 200 mg by mouth daily.  , Disp: , Rfl:    valACYclovir (VALTREX) 500 MG tablet, Take 500 mg by mouth 2 (two) times daily.  , Disp: , Rfl:     Past Medical History:   Anxiety                                                      Herpes simplex infection                                     Osteoporosis                                                 Sleep apnea                                                  Hypertension  Dyslipidemia                                                 Depression                                                   Asthma                                                       Fibromyalgia                                                 GERD (gastroesophageal reflux disease)                       Head injury, unspecified                                     Supraventricular tachycardia                                 Syncope and collapse                                           Comment:s/p ILR; no arhythmogenic cause identified   Coronary artery disease                                        Comment:reported hx of "MI";  Echo 2009 with normal               LVF;  Myoview 05/2011: no ischemia   Spondylolisthesis                                            Spinal stenosis of lumbar region                            Past Surgical History:   insertion of implatable loop recorder           08/11/2007      Comment:Gregg Taylor   head up tilt table testing                      06/15/2007      Comment:[facility] [employer] SURGERY  DOPPLER ECHOCARDIOGRAPHY                        2009        Review of patient's family history indicates:   Heart attack                   Father                   Heart attack                   Brother                    Comment: stents   Social History   Marital Status: Single              Spouse Name:                      Years of Education:                 Number of children:              Occupational History   None on file  Social History Main Topics   Smoking Status: Never Smoker                     Smokeless Status: Never Used                       Alcohol Use: No             Drug Use: Not on file    Sexual Activity: Not on file        Other Topics            Concern   None on file  Social History Narrative   None on file    ZOX:WRUEAVW muscle spasms in her legs which she is seeing physical therapy for,see history of present illness otherwise negative   PHYSICAL EXAM: Well-nournished, in no acute distress. Neck: No JVD, HJR, Bruit, or thyroid enlargement  Lungs: No tachypnea, clear without wheezing, rales, or rhonchi  Cardiovascular: RRR, PMI not displaced, heart sounds normal, no murmurs, gallops, bruit, thrill, or heave.  Abdomen: BS normal. Soft without organomegaly, masses, lesions or tenderness.  Extremities:Reglan without hematoma or hemorrhage a catheter site, lower extremities without cyanosis, clubbing or edema. Good distal pulses bilateral  SKin: Warm, no lesions or rashes   Musculoskeletal: No deformities  Neuro: no focal signs  BP 121/74  Ht 5\' 2"  (1.575 m)  Wt 134 lb 12.8 oz (61.145 kg)  BMI 24.66 kg/m2    UJW:[id number] sinus rhythm, low voltage

## 2011-10-22 NOTE — Assessment & Plan Note (Signed)
Patient has history of sinus tachycardia and questionable SVT. I told her to take an extra Tenormin when she has these symptoms and also showed her how to take her pulse.

## 2011-10-23 DIAGNOSIS — IMO0002 Reserved for concepts with insufficient information to code with codable children: Secondary | ICD-10-CM | POA: Diagnosis not present

## 2011-10-27 ENCOUNTER — Encounter: Payer: Medicare Other | Admitting: Physical Therapy

## 2011-10-28 ENCOUNTER — Ambulatory Visit: Payer: Medicare Other | Admitting: Physical Therapy

## 2011-10-29 DIAGNOSIS — F333 Major depressive disorder, recurrent, severe with psychotic symptoms: Secondary | ICD-10-CM | POA: Diagnosis not present

## 2011-11-01 NOTE — H&P (Signed)
Patient was seen by Mr. Alben Spittle as an add on in the office.  History reviewed.  Recommendations were for cardiac catheterization, and this has been arranged as an outpatient with Dr. Clifton James.

## 2011-11-04 ENCOUNTER — Ambulatory Visit: Payer: Medicare Other | Attending: Family Medicine | Admitting: Physical Therapy

## 2011-11-04 DIAGNOSIS — R5381 Other malaise: Secondary | ICD-10-CM | POA: Insufficient documentation

## 2011-11-04 DIAGNOSIS — M545 Low back pain, unspecified: Secondary | ICD-10-CM | POA: Insufficient documentation

## 2011-11-04 DIAGNOSIS — M546 Pain in thoracic spine: Secondary | ICD-10-CM | POA: Insufficient documentation

## 2011-11-04 DIAGNOSIS — IMO0001 Reserved for inherently not codable concepts without codable children: Secondary | ICD-10-CM | POA: Insufficient documentation

## 2011-11-04 DIAGNOSIS — R293 Abnormal posture: Secondary | ICD-10-CM | POA: Insufficient documentation

## 2011-11-06 ENCOUNTER — Ambulatory Visit: Payer: Medicare Other | Admitting: Physical Therapy

## 2011-11-06 DIAGNOSIS — M546 Pain in thoracic spine: Secondary | ICD-10-CM | POA: Diagnosis not present

## 2011-11-06 DIAGNOSIS — M545 Low back pain, unspecified: Secondary | ICD-10-CM | POA: Diagnosis not present

## 2011-11-06 DIAGNOSIS — R5381 Other malaise: Secondary | ICD-10-CM | POA: Diagnosis not present

## 2011-11-06 DIAGNOSIS — R293 Abnormal posture: Secondary | ICD-10-CM | POA: Diagnosis not present

## 2011-11-06 DIAGNOSIS — IMO0001 Reserved for inherently not codable concepts without codable children: Secondary | ICD-10-CM | POA: Diagnosis not present

## 2011-11-10 ENCOUNTER — Other Ambulatory Visit: Payer: Self-pay | Admitting: Internal Medicine

## 2011-11-12 ENCOUNTER — Ambulatory Visit: Payer: Medicare Other | Admitting: Physical Therapy

## 2011-11-12 DIAGNOSIS — IMO0001 Reserved for inherently not codable concepts without codable children: Secondary | ICD-10-CM | POA: Diagnosis not present

## 2011-11-12 DIAGNOSIS — E785 Hyperlipidemia, unspecified: Secondary | ICD-10-CM | POA: Diagnosis not present

## 2011-11-12 DIAGNOSIS — M545 Low back pain, unspecified: Secondary | ICD-10-CM | POA: Diagnosis not present

## 2011-11-12 DIAGNOSIS — M546 Pain in thoracic spine: Secondary | ICD-10-CM | POA: Diagnosis not present

## 2011-11-12 DIAGNOSIS — R5381 Other malaise: Secondary | ICD-10-CM | POA: Diagnosis not present

## 2011-11-12 DIAGNOSIS — I10 Essential (primary) hypertension: Secondary | ICD-10-CM | POA: Diagnosis not present

## 2011-11-12 DIAGNOSIS — R293 Abnormal posture: Secondary | ICD-10-CM | POA: Diagnosis not present

## 2011-11-17 DIAGNOSIS — F339 Major depressive disorder, recurrent, unspecified: Secondary | ICD-10-CM | POA: Diagnosis not present

## 2011-11-17 DIAGNOSIS — F4310 Post-traumatic stress disorder, unspecified: Secondary | ICD-10-CM | POA: Diagnosis not present

## 2011-11-18 ENCOUNTER — Ambulatory Visit: Payer: Medicare Other | Admitting: Physical Therapy

## 2011-11-18 DIAGNOSIS — M545 Low back pain, unspecified: Secondary | ICD-10-CM | POA: Diagnosis not present

## 2011-11-18 DIAGNOSIS — R293 Abnormal posture: Secondary | ICD-10-CM | POA: Diagnosis not present

## 2011-11-18 DIAGNOSIS — R5381 Other malaise: Secondary | ICD-10-CM | POA: Diagnosis not present

## 2011-11-18 DIAGNOSIS — M546 Pain in thoracic spine: Secondary | ICD-10-CM | POA: Diagnosis not present

## 2011-11-18 DIAGNOSIS — IMO0001 Reserved for inherently not codable concepts without codable children: Secondary | ICD-10-CM | POA: Diagnosis not present

## 2011-11-19 ENCOUNTER — Ambulatory Visit: Payer: Medicare Other | Admitting: Physical Therapy

## 2011-11-19 DIAGNOSIS — R5381 Other malaise: Secondary | ICD-10-CM | POA: Diagnosis not present

## 2011-11-19 DIAGNOSIS — M545 Low back pain, unspecified: Secondary | ICD-10-CM | POA: Diagnosis not present

## 2011-11-19 DIAGNOSIS — R293 Abnormal posture: Secondary | ICD-10-CM | POA: Diagnosis not present

## 2011-11-19 DIAGNOSIS — IMO0001 Reserved for inherently not codable concepts without codable children: Secondary | ICD-10-CM | POA: Diagnosis not present

## 2011-11-19 DIAGNOSIS — M546 Pain in thoracic spine: Secondary | ICD-10-CM | POA: Diagnosis not present

## 2011-11-20 ENCOUNTER — Ambulatory Visit: Payer: Medicare Other | Admitting: Physical Therapy

## 2011-11-20 DIAGNOSIS — R293 Abnormal posture: Secondary | ICD-10-CM | POA: Diagnosis not present

## 2011-11-20 DIAGNOSIS — M546 Pain in thoracic spine: Secondary | ICD-10-CM | POA: Diagnosis not present

## 2011-11-20 DIAGNOSIS — IMO0001 Reserved for inherently not codable concepts without codable children: Secondary | ICD-10-CM | POA: Diagnosis not present

## 2011-11-20 DIAGNOSIS — R5381 Other malaise: Secondary | ICD-10-CM | POA: Diagnosis not present

## 2011-11-20 DIAGNOSIS — M545 Low back pain, unspecified: Secondary | ICD-10-CM | POA: Diagnosis not present

## 2011-11-25 DIAGNOSIS — T310 Burns involving less than 10% of body surface: Secondary | ICD-10-CM | POA: Diagnosis not present

## 2011-11-25 DIAGNOSIS — I10 Essential (primary) hypertension: Secondary | ICD-10-CM | POA: Diagnosis not present

## 2011-11-25 DIAGNOSIS — Z79899 Other long term (current) drug therapy: Secondary | ICD-10-CM | POA: Diagnosis not present

## 2011-11-25 DIAGNOSIS — T2112XA Burn of first degree of abdominal wall, initial encounter: Secondary | ICD-10-CM | POA: Diagnosis not present

## 2011-11-25 DIAGNOSIS — T2102XA Burn of unspecified degree of abdominal wall, initial encounter: Secondary | ICD-10-CM | POA: Diagnosis not present

## 2011-11-25 DIAGNOSIS — T24119A Burn of first degree of unspecified thigh, initial encounter: Secondary | ICD-10-CM | POA: Diagnosis not present

## 2011-11-26 DIAGNOSIS — R079 Chest pain, unspecified: Secondary | ICD-10-CM | POA: Diagnosis not present

## 2011-11-26 DIAGNOSIS — M545 Low back pain, unspecified: Secondary | ICD-10-CM | POA: Diagnosis not present

## 2011-11-26 DIAGNOSIS — M546 Pain in thoracic spine: Secondary | ICD-10-CM | POA: Diagnosis not present

## 2011-11-26 DIAGNOSIS — M25569 Pain in unspecified knee: Secondary | ICD-10-CM | POA: Diagnosis not present

## 2011-12-01 DIAGNOSIS — Z79899 Other long term (current) drug therapy: Secondary | ICD-10-CM | POA: Diagnosis not present

## 2011-12-01 DIAGNOSIS — L02219 Cutaneous abscess of trunk, unspecified: Secondary | ICD-10-CM | POA: Diagnosis not present

## 2011-12-01 DIAGNOSIS — I10 Essential (primary) hypertension: Secondary | ICD-10-CM | POA: Diagnosis not present

## 2011-12-01 DIAGNOSIS — L02818 Cutaneous abscess of other sites: Secondary | ICD-10-CM | POA: Diagnosis not present

## 2011-12-15 ENCOUNTER — Encounter: Payer: Self-pay | Admitting: Physical Medicine & Rehabilitation

## 2011-12-19 DIAGNOSIS — Z23 Encounter for immunization: Secondary | ICD-10-CM | POA: Diagnosis not present

## 2011-12-26 ENCOUNTER — Ambulatory Visit (HOSPITAL_BASED_OUTPATIENT_CLINIC_OR_DEPARTMENT_OTHER): Payer: Medicare Other | Admitting: Physical Medicine & Rehabilitation

## 2011-12-26 ENCOUNTER — Encounter: Payer: Self-pay | Admitting: Physical Medicine & Rehabilitation

## 2011-12-26 ENCOUNTER — Encounter: Payer: Medicare Other | Attending: Physical Medicine & Rehabilitation

## 2011-12-26 VITALS — BP 136/86 | HR 69 | Resp 14 | Ht 62.0 in | Wt 132.0 lb

## 2011-12-26 DIAGNOSIS — G4730 Sleep apnea, unspecified: Secondary | ICD-10-CM | POA: Insufficient documentation

## 2011-12-26 DIAGNOSIS — E785 Hyperlipidemia, unspecified: Secondary | ICD-10-CM | POA: Insufficient documentation

## 2011-12-26 DIAGNOSIS — Z5181 Encounter for therapeutic drug level monitoring: Secondary | ICD-10-CM | POA: Diagnosis not present

## 2011-12-26 DIAGNOSIS — M7918 Myalgia, other site: Secondary | ICD-10-CM

## 2011-12-26 DIAGNOSIS — J45909 Unspecified asthma, uncomplicated: Secondary | ICD-10-CM | POA: Diagnosis not present

## 2011-12-26 DIAGNOSIS — M47817 Spondylosis without myelopathy or radiculopathy, lumbosacral region: Secondary | ICD-10-CM | POA: Diagnosis not present

## 2011-12-26 DIAGNOSIS — K219 Gastro-esophageal reflux disease without esophagitis: Secondary | ICD-10-CM | POA: Insufficient documentation

## 2011-12-26 DIAGNOSIS — M961 Postlaminectomy syndrome, not elsewhere classified: Secondary | ICD-10-CM | POA: Insufficient documentation

## 2011-12-26 DIAGNOSIS — IMO0001 Reserved for inherently not codable concepts without codable children: Secondary | ICD-10-CM | POA: Insufficient documentation

## 2011-12-26 DIAGNOSIS — I2510 Atherosclerotic heart disease of native coronary artery without angina pectoris: Secondary | ICD-10-CM | POA: Diagnosis not present

## 2011-12-26 DIAGNOSIS — Q762 Congenital spondylolisthesis: Secondary | ICD-10-CM | POA: Diagnosis not present

## 2011-12-26 DIAGNOSIS — I10 Essential (primary) hypertension: Secondary | ICD-10-CM | POA: Diagnosis not present

## 2011-12-26 DIAGNOSIS — M810 Age-related osteoporosis without current pathological fracture: Secondary | ICD-10-CM | POA: Insufficient documentation

## 2011-12-26 DIAGNOSIS — M47816 Spondylosis without myelopathy or radiculopathy, lumbar region: Secondary | ICD-10-CM

## 2011-12-26 NOTE — Patient Instructions (Addendum)
May take Tizanidine (Zanaflex) 1-2 tablets 3 times per day Do not take the [place] We will do lumbar facet joint injections next visit I think you will benefit from aquatic therapy. We will discuss this after the injections  You will see Dr. Ladona Ridgel for your supraventricular tachycardia

## 2011-12-26 NOTE — Progress Notes (Signed)
Subjective:    Patient ID: [name], female    DOB: 12-19-1955, 56 y.o.   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 She underwent surgery in March of 2010. She has good healing in the fusion site. Her last myelogram was in May of 2013. She does have degenerative changes above the fusion at L2-L3 and L3-L4. This was reviewed by her spine surgeon who felt this was mild. She did undergo an epidural injection at L2-L3 by Dr. Alvester Morin on 08/27/2011. Had cardiac catheterization in July of 2013 which showed normal coronary arteries. Cardiac enzymes after chest pain were normal. She has a history of supraventricular tachycardia and sees Dr. Ladona Ridgel for this. Has episodes of right lower ext weakness that occurs with pressure on her back Pain Inventory Average Pain 7 Pain Right Now 8 My pain is sharp, stabbing, tingling and aching  In the last 24 hours, has pain interfered with the following? General activity 10 Relation with others 10 Enjoyment of life 10 What TIME of day is your pain at its worst? constantly Sleep (in general) Poor  Pain is worse with: walking, bending, sitting and standing Pain improves with: medication and injections Relief from Meds: 5  Mobility use a cane how many minutes can you walk? 5-10 ability to climb steps?  yes do you drive?  yes  Function disabled: date disabled  I need assistance with the following:  meal prep, household duties and shopping  Neuro/Psych bowel control problems numbness tremor trouble walking spasms confusion depression anxiety suicidal thoughts  Past tense, has been under care of [facility]  Prior Studies Any changes since last visit?  no  Physicians involved in your care Any changes since last visit?  no   Family History  Problem Relation Age of Onset  . Heart attack Father   . Heart attack Brother     stents   History   Social History  . Marital Status:  Single    Spouse Name: N/A    Number of Children: N/A  . Years of Education: N/A   Social History Main Topics  . Smoking status: Never Smoker   . Smokeless tobacco: Never Used  . Alcohol Use: No  . Drug Use: None  . Sexually Active: None   Other Topics Concern  . None   Social History Narrative  . None   Past Surgical History  Procedure Date  . Insertion of implatable loop recorder 08/11/2007    [name]  . Head up tilt table testing 06/15/2007    [name]  . Back surgery   . Doppler echocardiography 2009   Past Medical History  Diagnosis Date  . Anxiety   . Herpes simplex infection   . Osteoporosis   . Sleep apnea   . Hypertension   . Dyslipidemia   . Depression   . Asthma   . Fibromyalgia   . GERD (gastroesophageal reflux disease)   . Head injury, unspecified   . Supraventricular tachycardia   . Syncope and collapse     s/p ILR; no arhythmogenic cause identified  . Coronary artery disease     reported hx of "MI";  Echo 2009 with normal LVF;  Myoview 05/2011: no ischemia  . Spondylolisthesis   . Spinal stenosis of lumbar region    BP 136/86  Pulse 69  Resp 14  Ht 5\' 2"  (1.575 m)  Wt 132 lb (59.875 kg)  BMI 24.14 kg/m2  SpO2 100%  Review of Systems  Respiratory: Positive for apnea, shortness of breath and wheezing.   Gastrointestinal: Positive for abdominal pain and diarrhea.  Musculoskeletal: Positive for gait problem.       Spasms  Skin: Positive for rash.  Neurological: Positive for tremors and numbness.  Psychiatric/Behavioral: Positive for confusion and dysphoric mood. The patient is nervous/anxious.   All other systems reviewed and are negative.       Objective:   Physical Exam  Constitutional: She is oriented to person, place, and time. She appears well-developed and well-nourished.  HENT:  Head: Normocephalic and atraumatic.  Musculoskeletal:       Lumbar back: She exhibits decreased range of motion and tenderness. She  exhibits no swelling, no edema, no deformity and normal pulse.       Midline surgical incision. Tenderness to palpation above the surgical incision around L2 and L3 area  Neurological: She is alert and oriented to person, place, and time. She has normal strength. A sensory deficit is present. She exhibits normal muscle tone. Gait normal.  Reflex Scores:      Tricep reflexes are 2+ on the right side and 2+ on the left side.      Bicep reflexes are 2+ on the right side and 2+ on the left side.      Brachioradialis reflexes are 2+ on the right side and 2+ on the left side.      Patellar reflexes are 2+ on the right side and 2+ on the left side.      Achilles reflexes are 2+ on the right side and 2+ on the left side.      Reduced sensation in the right L5 and S1 nerve root distribution In gets up slowly from a chair or from the exam table. She also move slowly when she flips from supine to prone.  Psychiatric: She has a normal mood and affect.          Assessment & Plan:  1. Lumbar postlaminectomy syndrome. She has evidence of facet degeneration as well as some mild spondylolisthesis above the level of the fusion. This may be triggering myofascial pain due to to mild instability I do not see any physiologic reason why she shouldn't have paralysis of her leg when her.  Spinal muscles are palpated. I think this is do to pain in addition and some anxiety. I recommend T12 L1-L2 medial branch blocks. Would recommend aquatic physical therapy Use Zanaflex rather than Pentazocine

## 2011-12-29 ENCOUNTER — Encounter: Payer: Self-pay | Admitting: Internal Medicine

## 2011-12-29 ENCOUNTER — Ambulatory Visit (INDEPENDENT_AMBULATORY_CARE_PROVIDER_SITE_OTHER): Payer: Medicare Other | Admitting: Internal Medicine

## 2011-12-29 VITALS — BP 128/84 | HR 59 | Ht 62.0 in | Wt 126.8 lb

## 2011-12-29 DIAGNOSIS — I10 Essential (primary) hypertension: Secondary | ICD-10-CM

## 2011-12-29 DIAGNOSIS — R000 Tachycardia, unspecified: Secondary | ICD-10-CM

## 2011-12-29 DIAGNOSIS — R55 Syncope and collapse: Secondary | ICD-10-CM

## 2011-12-29 DIAGNOSIS — I498 Other specified cardiac arrhythmias: Secondary | ICD-10-CM | POA: Diagnosis not present

## 2011-12-29 NOTE — Progress Notes (Signed)
HPI [name] returns today for followup. She is a very pleasant 56 year old woman with history of unexplained syncope, hypertension, status post insertion of an implantable loop recorder. In the interim, she has had a couple episodes of syncope. He did these occurs either in the standing or sitting posture. She was playing a game with her grandchildren when she passed out last. She states that she was unconscious for approximately 15 minutes. She did not seek medical attention. She has had no associated bradycardia or tachycardia arrhythmias with her syncopal episodes. She has had documented tachycardia in the past most likely due to sinus tachycardia. Her implantable loop recorder has reached end-of-life. She denies chest pain or shortness of breath. No peripheral edema. She is trying to lose weight. Allergies  Allergen Reactions  . Codeine Other (See Comments)    "I will have a heart attack."  . Morphine And Related Other (See Comments)    "It will cause me to have a heart attack."     Current Outpatient Prescriptions  Medication Sig Dispense Refill  . acyclovir ointment (ZOVIRAX) 5 % Apply 1 application topically as needed.      [name] albuterol (PROAIR HFA) 108 (90 BASE) MCG/ACT inhaler Inhale 2 puffs into the lungs every 6 (six) hours as needed.        . ALPRAZolam (XANAX) 1 MG tablet Take 1 mg by mouth 4 (four) times daily as needed.       [name] atenolol (TENORMIN) 25 MG tablet TAKE (1) TABLET TWICE DAILY.  60 tablet  3  . butalbital-acetaminophen-caffeine (FIORICET, ESGIC) 50-325-40 MG per tablet       . CYMBALTA 60 MG capsule Take 1 capsule by mouth at bedtime.      . diclofenac sodium (VOLTAREN) 1 % GEL Apply topically as needed.        . hydrochlorothiazide (HYDRODIURIL) 12.5 MG tablet Take 12.5 mg by mouth daily.        . niacin (NIASPAN) 500 MG CR tablet Take 500 mg by mouth at bedtime.      . nitroGLYCERIN (NITROSTAT) 0.4 MG SL tablet Place 0.4 mg under the tongue every 5 (five) minutes  as needed.        [name] omeprazole (PRILOSEC) 40 MG capsule Take 1 capsule by mouth Daily.      . pentazocine-acetaminophen (TALACEN) 25-650 MG TABS Take 1 tablet by mouth. q 4-6 hr prn      . rosuvastatin (CRESTOR) 20 MG tablet Take 20 mg by mouth daily.      . silver sulfADIAZINE (SILVADENE) 1 % cream Apply 1 application topically as needed.      [name] tiZANidine (ZANAFLEX) 2 MG tablet Take 1 tablet by mouth Three times a day.      . traZODone (DESYREL) 100 MG tablet Take 200 mg by mouth daily.        [name] triamcinolone (KENALOG) 0.1 % paste Place onto teeth as needed.      . valACYclovir (VALTREX) 500 MG tablet Take 500 mg by mouth 2 (two) times daily.           Past Medical History  Diagnosis Date  . Anxiety   . Herpes simplex infection   . Osteoporosis   . Sleep apnea   . Hypertension   . Dyslipidemia   . Depression   . Asthma   . Fibromyalgia   . GERD (gastroesophageal reflux disease)   . Head injury, unspecified   . Supraventricular tachycardia   . Syncope and  collapse     s/p ILR; no arhythmogenic cause identified  . Coronary artery disease     reported hx of "MI";  Echo 2009 with normal LVF;  Myoview 05/2011: no ischemia  . Spondylolisthesis   . Spinal stenosis of lumbar region     ROS:   All systems reviewed and negative except as noted in the HPI.   Past Surgical History  Procedure Date  . Insertion of implatable loop recorder 08/11/2007    [name]  . Head up tilt table testing 06/15/2007    [name]  . Back surgery   . Doppler echocardiography 2009     Family History  Problem Relation Age of Onset  . Heart attack Father   . Heart attack Brother     stents     History   Social History  . Marital Status: Single    Spouse Name: N/A    Number of Children: N/A  . Years of Education: N/A   Occupational History  . Not on file.   Social History Main Topics  . Smoking status: Never Smoker   . Smokeless tobacco: Never Used  . Alcohol Use: No  .  Drug Use: Not on file  . Sexually Active: Not on file   Other Topics Concern  . Not on file   Social History Narrative  . No narrative on file     BP 128/84  Pulse 59  Ht 5\' 2"  (1.575 m)  Wt 126 lb 12.8 oz (57.516 kg)  BMI 23.19 kg/m2  Physical Exam:  Well appearing middle-aged woman, NAD HEENT: Unremarkable Neck:  No JVD, no thyromegally Lungs:  Clear with no wheezes, rales, or rhonchi. Well-healed implantable loop recorder incision. HEART:  Regular rate rhythm, no murmurs, no rubs, no clicks Abd:  soft, positive bowel sounds, no organomegally, no rebound, no guarding Ext:  2 plus pulses, no edema, no cyanosis, no clubbing Skin:  No rashes no nodules Neuro:  CN II through XII intact, motor grossly intact   DEVICE  Device at end of life  Assess/Plan:

## 2011-12-29 NOTE — Patient Instructions (Addendum)
Your physician has recommended you make the following change in your medication: STOP HCTZ   Your physician wants you to follow-up in: ONE YEAR WITH DR. Ladona Ridgel.  You will receive a reminder letter in the mail two months in advance. If you don't receive a letter, please call our office to schedule the follow-up appointment.

## 2011-12-29 NOTE — Assessment & Plan Note (Signed)
At this point, the most likely explanation for the patient's loss of consciousness his orthostasis in the setting of autonomic dysfunction. She has not had any associated bradycardia or tachycardia arrhythmias with her episodes of altered consciousness. While this could be seizure-like activity, there is minimal residual after the episode. We discussed treatment options. I've asked the patient to stop HCTZ. We will live with her blood pressure being elevated.

## 2011-12-29 NOTE — Assessment & Plan Note (Signed)
Her blood pressure is well controlled today. She will continue her current medical therapy except for discontinuation of HCTZ.

## 2011-12-31 ENCOUNTER — Telehealth: Payer: Self-pay | Admitting: *Deleted

## 2011-12-31 NOTE — Telephone Encounter (Signed)
Inconsistent UDS. Negative for prescribed Tramadol.  LM with a female to have pt call us back.

## 2012-01-01 NOTE — Telephone Encounter (Signed)
Pt states she only takes Tramadol when she needs it. It had been a few days before the test was done when she last took it.

## 2012-01-05 DIAGNOSIS — R21 Rash and other nonspecific skin eruption: Secondary | ICD-10-CM | POA: Diagnosis not present

## 2012-01-13 ENCOUNTER — Encounter: Payer: Medicare Other | Attending: Physical Medicine & Rehabilitation

## 2012-01-13 ENCOUNTER — Encounter: Payer: Self-pay | Admitting: Physical Medicine & Rehabilitation

## 2012-01-13 ENCOUNTER — Ambulatory Visit (HOSPITAL_BASED_OUTPATIENT_CLINIC_OR_DEPARTMENT_OTHER): Payer: Medicare Other | Admitting: Physical Medicine & Rehabilitation

## 2012-01-13 VITALS — BP 102/49 | HR 61 | Resp 14 | Ht 62.0 in | Wt 129.0 lb

## 2012-01-13 DIAGNOSIS — Q762 Congenital spondylolisthesis: Secondary | ICD-10-CM | POA: Insufficient documentation

## 2012-01-13 DIAGNOSIS — K219 Gastro-esophageal reflux disease without esophagitis: Secondary | ICD-10-CM | POA: Insufficient documentation

## 2012-01-13 DIAGNOSIS — I2510 Atherosclerotic heart disease of native coronary artery without angina pectoris: Secondary | ICD-10-CM | POA: Diagnosis not present

## 2012-01-13 DIAGNOSIS — IMO0001 Reserved for inherently not codable concepts without codable children: Secondary | ICD-10-CM | POA: Insufficient documentation

## 2012-01-13 DIAGNOSIS — J45909 Unspecified asthma, uncomplicated: Secondary | ICD-10-CM | POA: Insufficient documentation

## 2012-01-13 DIAGNOSIS — M961 Postlaminectomy syndrome, not elsewhere classified: Secondary | ICD-10-CM | POA: Insufficient documentation

## 2012-01-13 DIAGNOSIS — M47817 Spondylosis without myelopathy or radiculopathy, lumbosacral region: Secondary | ICD-10-CM

## 2012-01-13 DIAGNOSIS — E785 Hyperlipidemia, unspecified: Secondary | ICD-10-CM | POA: Insufficient documentation

## 2012-01-13 DIAGNOSIS — I10 Essential (primary) hypertension: Secondary | ICD-10-CM | POA: Diagnosis not present

## 2012-01-13 DIAGNOSIS — M810 Age-related osteoporosis without current pathological fracture: Secondary | ICD-10-CM | POA: Diagnosis not present

## 2012-01-13 DIAGNOSIS — G4730 Sleep apnea, unspecified: Secondary | ICD-10-CM | POA: Diagnosis not present

## 2012-01-13 NOTE — Patient Instructions (Signed)
Facet Block A facet block is an injection procedure used to numb nerves near a spinal joint (facet). The injection usually includes a medicine like Novacaine (anesthetic) and a steroid medicine (similar to cortisone). The injections are made directly into the facet joint of the back. They are used for patients with several types of neck or back pain problems (such as worsening arthritis or persistent pain after surgery) that have not been helped with anti-inflammatory medications, exercise programs, physical therapy, and other forms of pain management. Multiple injections may be needed depending on how many joints are involved.  A facet block procedure can be helpful with diagnosis as well as providing therapeutic pain relief. One of three things may happen after the procedure:  The pain does not go away. This can mean that the pain is probably not coming from blocked facet joints. This information is helpful with diagnosis.  The pain goes away and stays away for a few hours but the original pain comes back and does not get better again. This information is also helpful with diagnosis. It can mean that pain is probably coming from the joints; but the steroid was not helpful for longer term pain control.  The pain goes away after the block, then returns later that day, and then gets better again over the next few days. This can mean that the block was helpful for pain control and the steroid had a longer lasting effect. If there is good, lasting benefit from the injections, the block may be repeated from 3 to 5 times. If there is good relief but it is only of short-term benefit, other procedures (such as radiofrequency lesioning) may be considered.  Note: The procedure cannot be performed if you have an active infection, a lesion on or near the area of injection, flu, cold, fever, very high blood pressure or if you are on blood thinners. Please make your doctor aware of any of these conditions. This is for  your safety!  LET YOUR CAREGIVER KNOW ABOUT:   Allergies.  Medications taken including herbs, eye drops, over the counter medications, and creams  Use of steroids (by mouth or creams).  Possible pregnancy, if applicable.  Previous problems with anesthetics or Novocaine.  History of blood clots.  History of bleeding or blood problems.  Previous surgery, particularly of the neck and/or back  Other health problems. RISKS AND COMPLICATIONS These are very uncommon but include:  Bleeding.  Injury to a nerve near the injection site.  Weakness or numbness in areas controlled by nerves near the injection site.  Infection.  Pain at the site of the injection.  Temporary fluid retention in those who are prone to this problem.  Allergic reaction to anesthetics or medicines used during the procedure. Diabetics may have a temporary increase in their blood sugar after any surgical procedure, especially if steroids are used. Stinging/burning of the numbing medicine is the most uncomfortable part of the procedure; however every person's response to any procedure is individual.  BEFORE THE PROCEDURE   Your caregiver will provide instructions about stopping any medication before the procedure.  Unless advised otherwise, if the injections are in your neck, you may take your medications as usual with a sip of water but do not eat or drink for 6 hours before the procedure.  Unless advised otherwise, you may eat, drink and take your medications as usual on the day of the procedure (both before and after) if the injections are to be in your lower back.    There is no other specific preparation necessary unless advised otherwise. PROCEDURE After checking your blood pressure, the procedure will be done in the x-ray (fluoroscopy) room while lying on your stomach. For procedures in the neck, an intravenous line is usually started. The back is then cleansed with an antiseptic soap. Sterile drapes are  placed in this area. The skin is numbed with a local anesthetic. This is felt as a stinging or burning sensation. Using x-ray guidance, needles are then advanced to the appropriate locations. Once the needles are in the proper location, the anesthetic and steroid is injected through the needles and the needles are removed. The skin is then cleansed and bandages are applied. Blood pressure will be checked again, and you will be discharged to leave with your ride after your caregiver says it is okay to go.  AFTER THE PROCEDURE  You may not drive for the remainder of the day after your procedure. An adult must be present to drive you home or to go with you in a taxi or on public transportation. The procedure will be canceled if you do not have a responsible adult with you! This is for your safety.  HOME CARE INSTRUCTIONS   The bandages noted above can be removed on the morning after the procedure.  Resume medications according to your caregiver's instructions.  No heat is to be used near or over the injected area(s) for the remainder of the day.  No tub bath or soaking in water (such as a pool, jacuzzi, etc.) for the remainder of the day.  Some local tenderness may be experienced for a couple of days after the injection. Using an ice pack three or four times a day will help this.  Keep track of the amount of pain relief as well as how long the pain relief lasted. SEEK MEDICAL CARE IF:   There is drainage from the injection site.  Pain is not controlled with medications prescribed.  There is significant bleeding or swelling. SEEK IMMEDIATE MEDICAL CARE IF:   You develop a fever of 101 F (38.3 C) or greater.  Worsening pain, swelling, and/or red streaking develops in the skin around the injection site.  Severe pain develops and cannot be controlled with medications prescribed.  You develop any headache, stiff neck, nausea, vomiting, or your eyes become very sensitive to light.  Weakness  or paralysis develops in arms or legs not present before the procedure.  You develop difficulty urinating or difficulty breathing. Document Released: 08/06/2006 Document Revised: 06/09/2011 Document Reviewed: 07/27/2008 [employer] Patient Information 2013 [employer].  

## 2012-01-13 NOTE — Progress Notes (Signed)
  PROCEDURE RECORD [facility]   Name: Cathaleen Freye DOB:10-15-1955 MRN: [id number]  Date:01/13/2012  Physician: Claudette Laws, MD    Nurse/CMA: Kelli Churn, CMA  Allergies:  Allergies  Allergen Reactions  . Codeine Other (See Comments)    "I will have a heart attack."  . Morphine And Related Other (See Comments)    "It will cause me to have a heart attack."    Consent Signed: yes  Is patient diabetic? no    Pregnant: no LMP: No LMP recorded. Patient is postmenopausal. (age 25-55)  Anticoagulants: no Anti-inflammatory: no Antibiotics: no  Procedure: medial branch block  Position: Prone Start Time:  1257pm End Time: 108  Fluoro Time: 24  RN/CMA Levens,CMA Levens,CMA    Time 1219 112 pm    BP 102/49 122/58    Pulse 61 68    Respirations 14 14    O2 Sat 97 97    S/S 6 6    Pain Level 10/10 7/10     D/C home with Fay-friend, patient A & O X 3, D/C instructions reviewed, and sits independently.

## 2012-01-13 NOTE — Progress Notes (Signed)
Bilateral Lumbar L3, L2,L 1 medial branch blocks  under fluoroscopic guidance  Indication: Lumbar pain which is not relieved by medication management or other conservative care and interfering with self-care and mobility.  Informed consent was obtained after describing risks and benefits of the procedure with the patient, this includes bleeding, infection, paralysis and medication side effects.  The patient wishes to proceed and has given written consent.  The patient was placed in prone position.  The lumbar area was marked and prepped with Betadine.  One mL of 1% lidocaine was injected into each of 6 areas into the skin and subcutaneous tissue.  Then a 22-gauge 3.5 spinal needle was inserted targeting the junction of the left 4 superior articular process jct. Needle was advanced under fluoroscopic guidance.  Bone contact was made.  Omnipaque 180 was injected x 0.5 mL demonstrating no intravascular uptake.  Then a solution containing one mL of 4 mg per mL dexamethasone and 3 mL of 2% MPF lidocaine was injected x 0.5 mL.  Then the left L3 superior articular process in transverse process junction was targeted.  Bone contact was made.  Omnipaque 180 was injected x 0.5 mL demonstrating no intravascular uptake. Then a solution containing one mL of 4 mg per mL dexamethasone and 3 mL of 2% MPF lidocaine was injected x 0.5 mL.  Then the left L2 superior articular process in transverse process junction was targeted.  Bone contact was made.  Omnipaque 180 was injected x 0.5 mL demonstrating no intravascular uptake.  Then a solution containing one mL of 4 mg per mL dexamethasone and 3 mL if 2% MPF lidocaine was injected x 0.5 mL.  This same procedure was performed on the right side using the same needle, technique and injectate.  Patient tolerated procedure well.  Post procedure instructions were given.

## 2012-01-19 ENCOUNTER — Telehealth: Payer: Self-pay

## 2012-01-19 NOTE — Telephone Encounter (Signed)
Patient had back injections and is still in pain.  She took talwin.  She is out of tramadol.  Please advise.

## 2012-01-19 NOTE — Telephone Encounter (Signed)
See previous message. Looks like we have not prescribed Tramadol and she was saying she tool one oher Talacen that Dr Otelia Sergeant gave her.

## 2012-01-21 ENCOUNTER — Telehealth: Payer: Self-pay | Admitting: Physical Medicine & Rehabilitation

## 2012-01-21 ENCOUNTER — Other Ambulatory Visit: Payer: Self-pay | Admitting: Physical Medicine & Rehabilitation

## 2012-01-21 NOTE — Telephone Encounter (Signed)
I spoke with [name], and explained that she is not under contract with our practice and [name] may prescribe.

## 2012-01-21 NOTE — Telephone Encounter (Signed)
She has multiple opioid allergies and not even a good candidate for tramadol since she is on Cymbalta She is not under contract with [name] and can get meds from PCP

## 2012-01-21 NOTE — Telephone Encounter (Signed)
Overexerted herself.  Should she get [name] to write Tramadol or can we write for a muscle relaxer?

## 2012-01-23 ENCOUNTER — Encounter: Payer: Self-pay | Admitting: *Deleted

## 2012-01-23 ENCOUNTER — Telehealth: Payer: Self-pay

## 2012-01-23 NOTE — Telephone Encounter (Signed)
Letter faxed.

## 2012-01-23 NOTE — Telephone Encounter (Signed)
patient needs letter faxed to [name] saying we are not prescribing medications and that she is injection only so they will refill her medication.  Fax [phone]

## 2012-01-28 DIAGNOSIS — J069 Acute upper respiratory infection, unspecified: Secondary | ICD-10-CM | POA: Diagnosis not present

## 2012-01-28 DIAGNOSIS — Z1231 Encounter for screening mammogram for malignant neoplasm of breast: Secondary | ICD-10-CM | POA: Diagnosis not present

## 2012-01-28 DIAGNOSIS — R059 Cough, unspecified: Secondary | ICD-10-CM | POA: Diagnosis not present

## 2012-01-28 DIAGNOSIS — R05 Cough: Secondary | ICD-10-CM | POA: Diagnosis not present

## 2012-01-28 LAB — HM MAMMOGRAPHY: HM Mammogram: NORMAL

## 2012-01-30 DIAGNOSIS — Z886 Allergy status to analgesic agent status: Secondary | ICD-10-CM | POA: Diagnosis not present

## 2012-01-30 DIAGNOSIS — Z23 Encounter for immunization: Secondary | ICD-10-CM | POA: Diagnosis not present

## 2012-01-30 DIAGNOSIS — K5731 Diverticulosis of large intestine without perforation or abscess with bleeding: Secondary | ICD-10-CM | POA: Diagnosis present

## 2012-01-30 DIAGNOSIS — I10 Essential (primary) hypertension: Secondary | ICD-10-CM | POA: Diagnosis present

## 2012-01-30 DIAGNOSIS — K922 Gastrointestinal hemorrhage, unspecified: Secondary | ICD-10-CM | POA: Diagnosis not present

## 2012-01-30 DIAGNOSIS — Z885 Allergy status to narcotic agent status: Secondary | ICD-10-CM | POA: Diagnosis not present

## 2012-01-30 DIAGNOSIS — G43909 Migraine, unspecified, not intractable, without status migrainosus: Secondary | ICD-10-CM | POA: Diagnosis present

## 2012-01-30 DIAGNOSIS — F4310 Post-traumatic stress disorder, unspecified: Secondary | ICD-10-CM | POA: Diagnosis present

## 2012-01-30 DIAGNOSIS — K5730 Diverticulosis of large intestine without perforation or abscess without bleeding: Secondary | ICD-10-CM | POA: Diagnosis not present

## 2012-01-30 DIAGNOSIS — Z79899 Other long term (current) drug therapy: Secondary | ICD-10-CM | POA: Diagnosis not present

## 2012-01-30 DIAGNOSIS — M549 Dorsalgia, unspecified: Secondary | ICD-10-CM | POA: Diagnosis present

## 2012-01-30 DIAGNOSIS — F3289 Other specified depressive episodes: Secondary | ICD-10-CM | POA: Diagnosis present

## 2012-01-30 DIAGNOSIS — E785 Hyperlipidemia, unspecified: Secondary | ICD-10-CM | POA: Diagnosis present

## 2012-01-30 DIAGNOSIS — J45909 Unspecified asthma, uncomplicated: Secondary | ICD-10-CM | POA: Diagnosis present

## 2012-01-30 DIAGNOSIS — F329 Major depressive disorder, single episode, unspecified: Secondary | ICD-10-CM | POA: Diagnosis present

## 2012-01-30 DIAGNOSIS — K649 Unspecified hemorrhoids: Secondary | ICD-10-CM | POA: Diagnosis present

## 2012-01-30 DIAGNOSIS — F449 Dissociative and conversion disorder, unspecified: Secondary | ICD-10-CM | POA: Diagnosis present

## 2012-01-30 DIAGNOSIS — A6000 Herpesviral infection of urogenital system, unspecified: Secondary | ICD-10-CM | POA: Diagnosis present

## 2012-01-30 DIAGNOSIS — N390 Urinary tract infection, site not specified: Secondary | ICD-10-CM | POA: Diagnosis not present

## 2012-01-30 DIAGNOSIS — K5289 Other specified noninfective gastroenteritis and colitis: Secondary | ICD-10-CM | POA: Diagnosis not present

## 2012-02-12 ENCOUNTER — Ambulatory Visit: Payer: Medicare Other | Admitting: Physical Medicine & Rehabilitation

## 2012-02-12 ENCOUNTER — Encounter: Payer: Medicare Other | Attending: Physical Medicine & Rehabilitation

## 2012-02-12 DIAGNOSIS — I2510 Atherosclerotic heart disease of native coronary artery without angina pectoris: Secondary | ICD-10-CM | POA: Insufficient documentation

## 2012-02-12 DIAGNOSIS — E785 Hyperlipidemia, unspecified: Secondary | ICD-10-CM | POA: Insufficient documentation

## 2012-02-12 DIAGNOSIS — J45909 Unspecified asthma, uncomplicated: Secondary | ICD-10-CM | POA: Insufficient documentation

## 2012-02-12 DIAGNOSIS — M810 Age-related osteoporosis without current pathological fracture: Secondary | ICD-10-CM | POA: Insufficient documentation

## 2012-02-12 DIAGNOSIS — K219 Gastro-esophageal reflux disease without esophagitis: Secondary | ICD-10-CM | POA: Insufficient documentation

## 2012-02-12 DIAGNOSIS — IMO0001 Reserved for inherently not codable concepts without codable children: Secondary | ICD-10-CM | POA: Insufficient documentation

## 2012-02-12 DIAGNOSIS — Q762 Congenital spondylolisthesis: Secondary | ICD-10-CM | POA: Insufficient documentation

## 2012-02-12 DIAGNOSIS — G4730 Sleep apnea, unspecified: Secondary | ICD-10-CM | POA: Insufficient documentation

## 2012-02-12 DIAGNOSIS — I10 Essential (primary) hypertension: Secondary | ICD-10-CM | POA: Insufficient documentation

## 2012-02-12 DIAGNOSIS — M961 Postlaminectomy syndrome, not elsewhere classified: Secondary | ICD-10-CM | POA: Insufficient documentation

## 2012-02-16 DIAGNOSIS — F333 Major depressive disorder, recurrent, severe with psychotic symptoms: Secondary | ICD-10-CM | POA: Diagnosis not present

## 2012-02-18 DIAGNOSIS — M899 Disorder of bone, unspecified: Secondary | ICD-10-CM | POA: Diagnosis not present

## 2012-02-18 LAB — HM DEXA SCAN

## 2012-02-20 DIAGNOSIS — I10 Essential (primary) hypertension: Secondary | ICD-10-CM | POA: Diagnosis not present

## 2012-02-20 DIAGNOSIS — E785 Hyperlipidemia, unspecified: Secondary | ICD-10-CM | POA: Diagnosis not present

## 2012-03-04 ENCOUNTER — Encounter: Payer: Medicare Other | Attending: Physical Medicine & Rehabilitation

## 2012-03-04 ENCOUNTER — Telehealth: Payer: Self-pay

## 2012-03-04 ENCOUNTER — Ambulatory Visit (HOSPITAL_BASED_OUTPATIENT_CLINIC_OR_DEPARTMENT_OTHER): Payer: Medicare Other | Admitting: Physical Medicine & Rehabilitation

## 2012-03-04 ENCOUNTER — Encounter: Payer: Self-pay | Admitting: Physical Medicine & Rehabilitation

## 2012-03-04 VITALS — BP 136/61 | HR 88 | Resp 14 | Ht 62.0 in | Wt 126.6 lb

## 2012-03-04 DIAGNOSIS — J45909 Unspecified asthma, uncomplicated: Secondary | ICD-10-CM | POA: Diagnosis not present

## 2012-03-04 DIAGNOSIS — I2510 Atherosclerotic heart disease of native coronary artery without angina pectoris: Secondary | ICD-10-CM | POA: Diagnosis not present

## 2012-03-04 DIAGNOSIS — M47817 Spondylosis without myelopathy or radiculopathy, lumbosacral region: Secondary | ICD-10-CM

## 2012-03-04 DIAGNOSIS — M810 Age-related osteoporosis without current pathological fracture: Secondary | ICD-10-CM | POA: Diagnosis not present

## 2012-03-04 DIAGNOSIS — E785 Hyperlipidemia, unspecified: Secondary | ICD-10-CM | POA: Diagnosis not present

## 2012-03-04 DIAGNOSIS — K219 Gastro-esophageal reflux disease without esophagitis: Secondary | ICD-10-CM | POA: Diagnosis not present

## 2012-03-04 DIAGNOSIS — Q762 Congenital spondylolisthesis: Secondary | ICD-10-CM | POA: Insufficient documentation

## 2012-03-04 DIAGNOSIS — IMO0001 Reserved for inherently not codable concepts without codable children: Secondary | ICD-10-CM | POA: Diagnosis not present

## 2012-03-04 DIAGNOSIS — M961 Postlaminectomy syndrome, not elsewhere classified: Secondary | ICD-10-CM | POA: Insufficient documentation

## 2012-03-04 DIAGNOSIS — I10 Essential (primary) hypertension: Secondary | ICD-10-CM | POA: Diagnosis not present

## 2012-03-04 DIAGNOSIS — G4730 Sleep apnea, unspecified: Secondary | ICD-10-CM | POA: Diagnosis not present

## 2012-03-04 MED ORDER — TIZANIDINE HCL 2 MG PO TABS: 4.0000 mg | ORAL_TABLET | Freq: Three times a day (TID) | ORAL | Status: DC

## 2012-03-04 MED ORDER — TIZANIDINE HCL 2 MG PO TABS: 4.0000 mg | ORAL_TABLET | Freq: Three times a day (TID) | ORAL | Status: DC | PRN

## 2012-03-04 NOTE — Telephone Encounter (Signed)
Heather with [facility] called regarding patients zanaflex prescription.  Quantity was written for 15 day supply with 1 refill, is this correct.  Please advise.

## 2012-03-04 NOTE — Telephone Encounter (Signed)
Clarified with Heather at the pharmacy that patient is to take 4mg  tid #90.

## 2012-03-04 NOTE — Telephone Encounter (Signed)
I want the patient to get 4 mg Zanaflex tablets #90 one by mouth 3 times a day with one refill

## 2012-03-04 NOTE — Progress Notes (Signed)
  PROCEDURE RECORD [facility]   Name: [name] DOB:1955/04/18 MRN: [id number]  Date:03/04/2012  Physician: Claudette Laws, MD    Nurse/CMA: Claiborne Rigg CMA  Allergies:  Allergies  Allergen Reactions  . Codeine Other (See Comments)    "I will have a heart attack."  . Morphine And Related Other (See Comments)    "It will cause me to have a heart attack."    Consent Signed: yes  Is patient diabetic? no  CBG today?  Pregnant: no LMP: No LMP recorded. Patient is postmenopausal. (age 54-55)  Anticoagulants: no Anti-inflammatory: no Antibiotics: no  Procedure:Right Medial Branch Block  Position: Supine Start Time:11:29   End Time:11:35  Fluoro Time: 18  RN/CMA Claiborne Rigg CMA Claiborne Rigg CMA    Time 11:00 11:38    BP 136/61 131/65    Pulse 88 63    Respirations 14 14    O2 Sat 98 99    S/S 6 6    Pain Level 8 0     D/C home with [name], patient A & O X 3, D/C instructions reviewed, and sits independently.

## 2012-03-04 NOTE — Patient Instructions (Signed)
Next visit we can either inject neck or upper lumbar

## 2012-03-04 NOTE — Progress Notes (Signed)
Right  L1, L2, L3 medial branch blocks under fluoroscopic guidance  Indication: Right Lumbar pain which is not relieved by medication management or other conservative care and interfering with self-care and mobility.  Informed consent was obtained after describing risks and benefits of the procedure with the patient, this includes bleeding, bruising, infection, paralysis and medication side effects. The patient wishes to proceed and has given written consent. The patient was placed in a prone position. The lumbar area was marked and prepped with Betadine. One ML of 1% lidocaine was injected into each of 3 areas into the skin and subcutaneous tissue. Then a 22-gauge 3.5 spinal needle was inserted targeting the junction of the Right L3 superior articular process /transverse process junction. Needle was advanced under fluoroscopic guidance. Bone contact was made. Omnipaque 180 was injected x0.5 mL demonstrating no intravascular uptake. Then a solution containing one ML of 4 mg per mL dexamethasone and 3 mL of 2% MPF lidocaine was injected x0.5 mL. Then the Right L2 superior articular process in transverse process junction was targeted. Bone contact was made. Omnipaque 180 was injected x0.5 mL demonstrating no intravascular uptake. Then a solution containing one ML of 4 mg per mL dexamethasone and 3 mL of 2% MPF lidocaine was injected x0.5 mL. Then the Right L1 superior articular process in transverse process junction was targeted. Bone contact was made. Omnipaque 180 was injected x0.5 mL demonstrating no intravascular uptake. Then a solution containing one ML of 4 mg per mL dexamethasone and 3 mL of 2% MPF lidocaine was injected x0.5 mL. Patient tolerated procedure well. Post procedure instructions were given. Please refer to post procedure form.

## 2012-03-09 DIAGNOSIS — F339 Major depressive disorder, recurrent, unspecified: Secondary | ICD-10-CM | POA: Diagnosis not present

## 2012-03-09 DIAGNOSIS — F4310 Post-traumatic stress disorder, unspecified: Secondary | ICD-10-CM | POA: Diagnosis not present

## 2012-03-22 DIAGNOSIS — F4310 Post-traumatic stress disorder, unspecified: Secondary | ICD-10-CM | POA: Diagnosis not present

## 2012-04-08 ENCOUNTER — Ambulatory Visit: Payer: Medicare Other | Admitting: Physical Medicine & Rehabilitation

## 2012-04-19 DIAGNOSIS — K5289 Other specified noninfective gastroenteritis and colitis: Secondary | ICD-10-CM | POA: Diagnosis not present

## 2012-04-19 DIAGNOSIS — R109 Unspecified abdominal pain: Secondary | ICD-10-CM | POA: Diagnosis not present

## 2012-04-19 DIAGNOSIS — K625 Hemorrhage of anus and rectum: Secondary | ICD-10-CM | POA: Diagnosis not present

## 2012-04-26 DIAGNOSIS — Z800 Family history of malignant neoplasm of digestive organs: Secondary | ICD-10-CM | POA: Diagnosis not present

## 2012-04-26 DIAGNOSIS — G4700 Insomnia, unspecified: Secondary | ICD-10-CM | POA: Diagnosis not present

## 2012-04-26 DIAGNOSIS — J45909 Unspecified asthma, uncomplicated: Secondary | ICD-10-CM | POA: Diagnosis not present

## 2012-04-26 DIAGNOSIS — F4310 Post-traumatic stress disorder, unspecified: Secondary | ICD-10-CM | POA: Diagnosis not present

## 2012-04-26 DIAGNOSIS — I2510 Atherosclerotic heart disease of native coronary artery without angina pectoris: Secondary | ICD-10-CM | POA: Diagnosis not present

## 2012-04-26 DIAGNOSIS — I498 Other specified cardiac arrhythmias: Secondary | ICD-10-CM | POA: Diagnosis not present

## 2012-04-26 DIAGNOSIS — Z8249 Family history of ischemic heart disease and other diseases of the circulatory system: Secondary | ICD-10-CM | POA: Diagnosis not present

## 2012-04-26 DIAGNOSIS — I252 Old myocardial infarction: Secondary | ICD-10-CM | POA: Diagnosis not present

## 2012-04-26 DIAGNOSIS — F329 Major depressive disorder, single episode, unspecified: Secondary | ICD-10-CM | POA: Diagnosis not present

## 2012-04-26 DIAGNOSIS — IMO0002 Reserved for concepts with insufficient information to code with codable children: Secondary | ICD-10-CM | POA: Diagnosis not present

## 2012-04-26 DIAGNOSIS — J209 Acute bronchitis, unspecified: Secondary | ICD-10-CM | POA: Diagnosis not present

## 2012-04-26 DIAGNOSIS — F3289 Other specified depressive episodes: Secondary | ICD-10-CM | POA: Diagnosis not present

## 2012-04-26 DIAGNOSIS — K5289 Other specified noninfective gastroenteritis and colitis: Secondary | ICD-10-CM | POA: Diagnosis not present

## 2012-04-26 DIAGNOSIS — R1032 Left lower quadrant pain: Secondary | ICD-10-CM | POA: Diagnosis not present

## 2012-04-26 DIAGNOSIS — R109 Unspecified abdominal pain: Secondary | ICD-10-CM | POA: Diagnosis not present

## 2012-04-26 DIAGNOSIS — K5730 Diverticulosis of large intestine without perforation or abscess without bleeding: Secondary | ICD-10-CM | POA: Diagnosis not present

## 2012-04-26 DIAGNOSIS — F411 Generalized anxiety disorder: Secondary | ICD-10-CM | POA: Diagnosis not present

## 2012-04-26 DIAGNOSIS — K625 Hemorrhage of anus and rectum: Secondary | ICD-10-CM | POA: Diagnosis not present

## 2012-04-26 DIAGNOSIS — Z885 Allergy status to narcotic agent status: Secondary | ICD-10-CM | POA: Diagnosis not present

## 2012-04-26 DIAGNOSIS — Z8719 Personal history of other diseases of the digestive system: Secondary | ICD-10-CM | POA: Diagnosis not present

## 2012-04-26 DIAGNOSIS — Z79899 Other long term (current) drug therapy: Secondary | ICD-10-CM | POA: Diagnosis not present

## 2012-04-26 DIAGNOSIS — G43909 Migraine, unspecified, not intractable, without status migrainosus: Secondary | ICD-10-CM | POA: Diagnosis not present

## 2012-04-26 DIAGNOSIS — K219 Gastro-esophageal reflux disease without esophagitis: Secondary | ICD-10-CM | POA: Diagnosis not present

## 2012-04-26 DIAGNOSIS — Z950 Presence of cardiac pacemaker: Secondary | ICD-10-CM | POA: Diagnosis not present

## 2012-04-26 DIAGNOSIS — E7800 Pure hypercholesterolemia, unspecified: Secondary | ICD-10-CM | POA: Diagnosis not present

## 2012-04-26 DIAGNOSIS — I10 Essential (primary) hypertension: Secondary | ICD-10-CM | POA: Diagnosis not present

## 2012-04-29 ENCOUNTER — Ambulatory Visit: Payer: Medicare Other | Admitting: Physical Medicine & Rehabilitation

## 2012-05-03 DIAGNOSIS — R5381 Other malaise: Secondary | ICD-10-CM | POA: Diagnosis not present

## 2012-05-03 DIAGNOSIS — E785 Hyperlipidemia, unspecified: Secondary | ICD-10-CM | POA: Diagnosis not present

## 2012-05-03 DIAGNOSIS — K219 Gastro-esophageal reflux disease without esophagitis: Secondary | ICD-10-CM | POA: Diagnosis not present

## 2012-05-03 DIAGNOSIS — I10 Essential (primary) hypertension: Secondary | ICD-10-CM | POA: Diagnosis not present

## 2012-05-03 DIAGNOSIS — R5383 Other fatigue: Secondary | ICD-10-CM | POA: Diagnosis not present

## 2012-05-06 DIAGNOSIS — Z124 Encounter for screening for malignant neoplasm of cervix: Secondary | ICD-10-CM | POA: Diagnosis not present

## 2012-05-06 DIAGNOSIS — R87610 Atypical squamous cells of undetermined significance on cytologic smear of cervix (ASC-US): Secondary | ICD-10-CM | POA: Diagnosis not present

## 2012-05-10 DIAGNOSIS — F333 Major depressive disorder, recurrent, severe with psychotic symptoms: Secondary | ICD-10-CM | POA: Diagnosis not present

## 2012-05-13 ENCOUNTER — Ambulatory Visit: Payer: Medicare Other | Admitting: Physical Medicine & Rehabilitation

## 2012-05-14 ENCOUNTER — Other Ambulatory Visit: Payer: Self-pay | Admitting: Internal Medicine

## 2012-05-14 ENCOUNTER — Other Ambulatory Visit: Payer: Self-pay | Admitting: Physical Medicine & Rehabilitation

## 2012-05-26 DIAGNOSIS — F411 Generalized anxiety disorder: Secondary | ICD-10-CM | POA: Diagnosis not present

## 2012-05-26 DIAGNOSIS — F329 Major depressive disorder, single episode, unspecified: Secondary | ICD-10-CM | POA: Diagnosis not present

## 2012-05-26 DIAGNOSIS — F341 Dysthymic disorder: Secondary | ICD-10-CM | POA: Diagnosis not present

## 2012-05-26 DIAGNOSIS — I10 Essential (primary) hypertension: Secondary | ICD-10-CM | POA: Diagnosis not present

## 2012-05-26 DIAGNOSIS — F3289 Other specified depressive episodes: Secondary | ICD-10-CM | POA: Diagnosis not present

## 2012-05-26 DIAGNOSIS — Z79899 Other long term (current) drug therapy: Secondary | ICD-10-CM | POA: Diagnosis not present

## 2012-05-28 ENCOUNTER — Encounter: Payer: Medicare Other | Attending: Physical Medicine & Rehabilitation

## 2012-05-28 ENCOUNTER — Ambulatory Visit (HOSPITAL_BASED_OUTPATIENT_CLINIC_OR_DEPARTMENT_OTHER): Payer: Medicare Other | Admitting: Physical Medicine & Rehabilitation

## 2012-05-28 ENCOUNTER — Encounter: Payer: Self-pay | Admitting: Physical Medicine & Rehabilitation

## 2012-05-28 VITALS — BP 89/62 | HR 59 | Resp 14 | Ht 62.0 in | Wt 127.2 lb

## 2012-05-28 DIAGNOSIS — IMO0001 Reserved for inherently not codable concepts without codable children: Secondary | ICD-10-CM | POA: Diagnosis not present

## 2012-05-28 DIAGNOSIS — J45909 Unspecified asthma, uncomplicated: Secondary | ICD-10-CM | POA: Insufficient documentation

## 2012-05-28 DIAGNOSIS — K219 Gastro-esophageal reflux disease without esophagitis: Secondary | ICD-10-CM | POA: Insufficient documentation

## 2012-05-28 DIAGNOSIS — G894 Chronic pain syndrome: Secondary | ICD-10-CM | POA: Diagnosis not present

## 2012-05-28 DIAGNOSIS — G4730 Sleep apnea, unspecified: Secondary | ICD-10-CM | POA: Insufficient documentation

## 2012-05-28 DIAGNOSIS — I2510 Atherosclerotic heart disease of native coronary artery without angina pectoris: Secondary | ICD-10-CM | POA: Diagnosis not present

## 2012-05-28 DIAGNOSIS — I10 Essential (primary) hypertension: Secondary | ICD-10-CM | POA: Insufficient documentation

## 2012-05-28 DIAGNOSIS — Q762 Congenital spondylolisthesis: Secondary | ICD-10-CM | POA: Diagnosis not present

## 2012-05-28 DIAGNOSIS — E785 Hyperlipidemia, unspecified: Secondary | ICD-10-CM | POA: Insufficient documentation

## 2012-05-28 DIAGNOSIS — M810 Age-related osteoporosis without current pathological fracture: Secondary | ICD-10-CM | POA: Insufficient documentation

## 2012-05-28 DIAGNOSIS — M7918 Myalgia, other site: Secondary | ICD-10-CM

## 2012-05-28 DIAGNOSIS — M961 Postlaminectomy syndrome, not elsewhere classified: Secondary | ICD-10-CM | POA: Insufficient documentation

## 2012-05-28 MED ORDER — KETOROLAC TROMETHAMINE 30 MG/ML IJ SOLN
30.0000 mg | Freq: Once | INTRAMUSCULAR | Status: AC
  Administered 2012-05-28: 30 mg via INTRAMUSCULAR

## 2012-05-28 MED ORDER — CARISOPRODOL 350 MG PO TABS: 350.0000 mg | ORAL_TABLET | Freq: Three times a day (TID) | ORAL | Status: DC | PRN

## 2012-05-28 MED ORDER — KETOROLAC TROMETHAMINE 60 MG/2ML IM SOLN: 30.0000 mg | Freq: Once | INTRAMUSCULAR | Status: DC

## 2012-05-28 NOTE — Progress Notes (Signed)
Subjective:    Patient ID: [name], female    DOB: 1956/03/21, 57 y.o.   MRN: [id number] 57 year old female with lumbosacral spondylolisthesis at L4-L5 level. She underwent surgery in March of 2010. She has good healing in the fusion site. Her last myelogram was in May of 2013. She does have degenerative changes above the fusion at L2-L3 and L3-L4. This was reviewed by her spine surgeon who felt this was mild. She did undergo an epidural injection at L2-L3 by Dr. Alvester Morin on 08/27/2011. Had cardiac catheterization in July of 2013 which showed normal coronary arteries. Cardiac enzymes after chest pain were normal. She has a history of supraventricular tachycardia and sees Dr. Ladona Ridgel for this.  HPI Patient  Complains of thoracic pain. Not due to trauma. A lot of stress recently. Grandchildren taken out of her custody . Has followed up with psychiatry. Medications have been adjusted. Also had butalbital Prescribed by primary care as well as oxycodone prescribed by ED physician Responded well to medial branch blocks On the right side above the level of fusion, L1 L2-L3 Pain Inventory Average Pain 7 Pain Right Now 10 My pain is sharp and aching  In the last 24 hours, has pain interfered with the following? General activity 0 Relation with others 0 Enjoyment of life 0 What TIME of day is your pain at its worst? all Sleep (in general) Poor  Pain is worse with: walking, bending, sitting and standing Pain improves with: rest and therapy/exercise Relief from Meds: 2  Mobility use a cane ability to climb steps?  no  Function disabled: date disabled see chart I need assistance with the following:  dressing, household duties and shopping  Neuro/Psych bladder control problems bowel control problems weakness numbness tingling trouble walking dizziness confusion depression anxiety suicidal thoughts no plan says "not now"  Prior Studies Any changes since last visit?   yes hospitalized yesterday short term, anxiety  Physicians involved in your care Any changes since last visit?  no   Family History  Problem Relation Age of Onset  . Heart attack Father   . Cancer Father   . Mental illness Father   . Heart attack Brother     stents  . Cancer Mother   . Mental illness Mother    History   Social History  . Marital Status: Single    Spouse Name: N/A    Number of Children: N/A  . Years of Education: N/A   Social History Main Topics  . Smoking status: Never Smoker   . Smokeless tobacco: Never Used  . Alcohol Use: No  . Drug Use: None  . Sexually Active: None   Other Topics Concern  . None   Social History Narrative  . None   Past Surgical History  Procedure Laterality Date  . Insertion of implatable loop recorder  08/11/2007    [name]  . Head up tilt table testing  06/15/2007    [name]  . Back surgery    . Doppler echocardiography  2009   Past Medical History  Diagnosis Date  . Anxiety   . Herpes simplex infection   . Osteoporosis   . Sleep apnea   . Hypertension   . Dyslipidemia   . Depression   . Asthma   . Fibromyalgia   . GERD (gastroesophageal reflux disease)   . Head injury, unspecified   . Supraventricular tachycardia   . Syncope and collapse     s/p ILR; no arhythmogenic cause identified  .  Coronary artery disease     reported hx of "MI";  Echo 2009 with normal LVF;  Myoview 05/2011: no ischemia  . Spondylolisthesis   . Spinal stenosis of lumbar region   . Hyperlipidemia   . Bipolar 1 disorder   . Insomnia   . Arthritis     RHEUMATOID   BP 89/62  Pulse 59  Resp 14  Ht 5\' 2"  (1.575 m)  Wt 127 lb 3.2 oz (57.698 kg)  BMI 23.26 kg/m2  SpO2 99%    Review of Systems  Constitutional: Positive for unexpected weight change.  Respiratory: Positive for apnea, shortness of breath and wheezing.   Gastrointestinal: Positive for abdominal pain.       Bowel Control Problems  Musculoskeletal: Positive  for gait problem.       Spasms  Neurological: Positive for dizziness, weakness and numbness.       Tingling  Hematological: Bruises/bleeds easily.  Psychiatric/Behavioral: Positive for confusion and dysphoric mood. The patient is nervous/anxious.   All other systems reviewed and are negative.       Objective:   Physical Exam  Nursing note and vitals reviewed. Constitutional: She appears well-developed and well-nourished.  HENT:  Head: Normocephalic and atraumatic.  Eyes: Conjunctivae and EOM are normal. Pupils are equal, round, and reactive to light.  Musculoskeletal:       Thoracic back: She exhibits tenderness, pain and spasm.       Lumbar back: She exhibits decreased range of motion, tenderness and spasm.  Psychiatric: Her speech is normal and behavior is normal. Judgment and thought content normal. Her mood appears anxious. Her affect is labile. Cognition and memory are normal. She exhibits a depressed mood. She expresses no homicidal and no suicidal ideation. She expresses no suicidal plans and no homicidal plans.    Tenderness to palpation left T7 paraspinal right T10 paraspinal left T12 paraspinal right L2 paraspinal       Assessment & Plan:  1. Myofascial pain syndrome thoracic paraspinals, Right C3 cervical paraspinal Also involve right L2 paraspinal, pain is increased related to emotional distress. No recent trauma or other increased physical activity  Will do trigger point injection today. We'll prescribe soma 350 mg 3 times a day x2 weeks only and then resume tizanidine Repeat medial branch blocks in 2 more months on the right side L1 L2-L3  Trigger point injection Indication thoracic,Cervical  and upper lumbar paraspinal pain not relieved by medication management and other conservative care Informed consent was obtained after describing risks and benefits of the procedure with the patient these include bleeding bruising and infection she elects to proceed and has  given written consent Patient placedIn a prone position. Spinal muscles adjacent to right C3, right L2, right T10, left T7, left T12 were marked and prepped with Betadine and alcohol. There entered with 25-gauge 1.5 inch needle. One half mL of 1% lidocaine injected into the cervical paraspinal and one mL of 1% lidocaine injected into the remaining paraspinal muscles. Patient tolerated procedure well. Post procedure instructions given

## 2012-05-28 NOTE — Patient Instructions (Addendum)
We will give you soma which is another muscle relaxer that you can use for 2 weeks. Then you will need to resume Zanaflex. Next visit in 2 months. Will repeat x-ray guided injections

## 2012-06-09 DIAGNOSIS — K5732 Diverticulitis of large intestine without perforation or abscess without bleeding: Secondary | ICD-10-CM | POA: Diagnosis not present

## 2012-06-09 DIAGNOSIS — N760 Acute vaginitis: Secondary | ICD-10-CM | POA: Diagnosis not present

## 2012-06-10 ENCOUNTER — Telehealth: Payer: Self-pay

## 2012-06-10 NOTE — Telephone Encounter (Signed)
Patient is having problems with increase headaches.  In the past [name] has given her percocet and this has helped.  Patient is requesting this again.  She is under increased stress due to court issues.  Please advise.

## 2012-06-10 NOTE — Telephone Encounter (Signed)
I don't treat headaches, she needs evaluation from neuro or follow up with MD who treats these

## 2012-06-11 NOTE — Telephone Encounter (Signed)
Spoke with Toniann Fail at [facility] and informed her we do not prescribe narcotics.

## 2012-06-11 NOTE — Telephone Encounter (Signed)
Is it ok for [name] to treat patients headaches with percocet?

## 2012-06-11 NOTE — Telephone Encounter (Signed)
It doesn't look like we are prescribing narcotics so ok

## 2012-06-17 ENCOUNTER — Other Ambulatory Visit: Payer: Self-pay | Admitting: *Deleted

## 2012-06-17 MED ORDER — DICLOFENAC SODIUM 1 % TD GEL: 2.0000 g | TRANSDERMAL | Status: DC | PRN

## 2012-06-21 ENCOUNTER — Other Ambulatory Visit: Payer: Self-pay

## 2012-06-21 MED ORDER — NIACIN ER (ANTIHYPERLIPIDEMIC) 500 MG PO TBCR: 500.0000 mg | EXTENDED_RELEASE_TABLET | Freq: Every day | ORAL | Status: DC

## 2012-06-21 MED ORDER — ROSUVASTATIN CALCIUM 20 MG PO TABS: 20.0000 mg | ORAL_TABLET | Freq: Every day | ORAL | Status: DC

## 2012-06-25 ENCOUNTER — Telehealth: Payer: Self-pay | Admitting: *Deleted

## 2012-06-25 NOTE — Telephone Encounter (Signed)
Please advise 

## 2012-06-25 NOTE — Telephone Encounter (Signed)
voltaren gel denied  Because medications on formulary have not been tried. Ex: meloxicam.naproxen.ibuprofen,and nabumetone.

## 2012-06-25 NOTE — Telephone Encounter (Signed)
Let patient know and find out if has tried other meds listed

## 2012-07-02 MED ORDER — NAPROXEN 500 MG PO TABS: 500.0000 mg | ORAL_TABLET | Freq: Two times a day (BID) | ORAL | Status: DC

## 2012-07-02 NOTE — Telephone Encounter (Signed)
Did either one of them help?

## 2012-07-02 NOTE — Telephone Encounter (Signed)
  Talked with Ms [name] re what medications she has taken, she has taken meloxicam and ibuprofen.

## 2012-07-02 NOTE — Telephone Encounter (Signed)
Insurance won't cover voltaren gel. Try Naprosyn 500mg  will send to pharmacy.

## 2012-07-02 NOTE — Telephone Encounter (Signed)
No they did not help one bit,  please let your nurse what you would like to order her and let pt know. Thanks

## 2012-07-13 ENCOUNTER — Other Ambulatory Visit: Payer: Self-pay | Admitting: Nurse Practitioner

## 2012-07-13 NOTE — Telephone Encounter (Signed)
Need to see hart when come in tomorrow

## 2012-07-14 MED ORDER — OXYCODONE-ACETAMINOPHEN 5-325 MG PO TABS: 1.0000 | ORAL_TABLET | Freq: Four times a day (QID) | ORAL | Status: DC | PRN

## 2012-07-14 NOTE — Telephone Encounter (Signed)
patient suppose to get from pain clinic, But will refill this time. Need to pick RX up.

## 2012-07-14 NOTE — Telephone Encounter (Signed)
Patient aware to pick up but has a fax sent to [name] that we need to take care of this all the time now

## 2012-07-22 ENCOUNTER — Other Ambulatory Visit: Payer: Self-pay | Admitting: *Deleted

## 2012-07-22 ENCOUNTER — Ambulatory Visit: Payer: Medicare Other | Admitting: Physical Medicine & Rehabilitation

## 2012-07-22 MED ORDER — BUTALBITAL-APAP-CAFFEINE 50-325-40 MG PO TABS: 1.0000 | ORAL_TABLET | Freq: Four times a day (QID) | ORAL | Status: DC | PRN

## 2012-07-22 NOTE — Telephone Encounter (Signed)
LAST RF 06/17/12. LAST OV 06/09/12. CALL IN TO [facility] [phone]

## 2012-07-26 ENCOUNTER — Telehealth: Payer: Self-pay

## 2012-07-26 NOTE — Telephone Encounter (Signed)
4 mg 3 times a day #90 one refill

## 2012-07-26 NOTE — Telephone Encounter (Signed)
Which dose of tizanidine does the patient need to resume?  Please advise.

## 2012-07-27 ENCOUNTER — Ambulatory Visit: Payer: Medicare Other | Admitting: Physical Medicine & Rehabilitation

## 2012-07-27 ENCOUNTER — Other Ambulatory Visit: Payer: Self-pay | Admitting: *Deleted

## 2012-07-27 MED ORDER — TIZANIDINE HCL 4 MG PO TABS: 4.0000 mg | ORAL_TABLET | Freq: Three times a day (TID) | ORAL | Status: DC

## 2012-07-27 NOTE — Telephone Encounter (Signed)
Last filled on 05-17-12. Pt last seen in office on 06-09-12. Please advise. Thank you

## 2012-07-27 NOTE — Telephone Encounter (Signed)
  Which med for this pt needs rf?

## 2012-07-27 NOTE — Telephone Encounter (Signed)
No documentation as to what pateint wants.

## 2012-07-27 NOTE — Telephone Encounter (Signed)
Rx already filled. 

## 2012-07-29 ENCOUNTER — Telehealth: Payer: Self-pay | Admitting: Nurse Practitioner

## 2012-07-29 MED ORDER — DOXYCYCLINE HYCLATE 100 MG PO TABS: 100.0000 mg | ORAL_TABLET | Freq: Two times a day (BID) | ORAL | Status: DC

## 2012-07-29 NOTE — Telephone Encounter (Signed)
Patient aware.

## 2012-07-29 NOTE — Telephone Encounter (Signed)
Please advise 

## 2012-07-29 NOTE — Telephone Encounter (Signed)
Doxycycline RX sent to pharmacy- Force fluids, Delsym OTC for cough

## 2012-08-02 DIAGNOSIS — F333 Major depressive disorder, recurrent, severe with psychotic symptoms: Secondary | ICD-10-CM | POA: Diagnosis not present

## 2012-08-03 ENCOUNTER — Encounter: Payer: Self-pay | Admitting: Physical Medicine & Rehabilitation

## 2012-08-03 ENCOUNTER — Encounter: Payer: Medicare Other | Attending: Physical Medicine & Rehabilitation

## 2012-08-03 ENCOUNTER — Ambulatory Visit (HOSPITAL_BASED_OUTPATIENT_CLINIC_OR_DEPARTMENT_OTHER): Payer: Medicare Other | Admitting: Physical Medicine & Rehabilitation

## 2012-08-03 VITALS — BP 96/56 | HR 82 | Resp 14 | Ht 62.0 in | Wt 124.0 lb

## 2012-08-03 DIAGNOSIS — M810 Age-related osteoporosis without current pathological fracture: Secondary | ICD-10-CM | POA: Diagnosis not present

## 2012-08-03 DIAGNOSIS — E785 Hyperlipidemia, unspecified: Secondary | ICD-10-CM | POA: Diagnosis not present

## 2012-08-03 DIAGNOSIS — K219 Gastro-esophageal reflux disease without esophagitis: Secondary | ICD-10-CM | POA: Insufficient documentation

## 2012-08-03 DIAGNOSIS — M961 Postlaminectomy syndrome, not elsewhere classified: Secondary | ICD-10-CM | POA: Diagnosis not present

## 2012-08-03 DIAGNOSIS — I10 Essential (primary) hypertension: Secondary | ICD-10-CM | POA: Diagnosis not present

## 2012-08-03 DIAGNOSIS — J45909 Unspecified asthma, uncomplicated: Secondary | ICD-10-CM | POA: Insufficient documentation

## 2012-08-03 DIAGNOSIS — I2510 Atherosclerotic heart disease of native coronary artery without angina pectoris: Secondary | ICD-10-CM | POA: Insufficient documentation

## 2012-08-03 DIAGNOSIS — Q762 Congenital spondylolisthesis: Secondary | ICD-10-CM | POA: Insufficient documentation

## 2012-08-03 DIAGNOSIS — G4730 Sleep apnea, unspecified: Secondary | ICD-10-CM | POA: Insufficient documentation

## 2012-08-03 DIAGNOSIS — IMO0001 Reserved for inherently not codable concepts without codable children: Secondary | ICD-10-CM | POA: Insufficient documentation

## 2012-08-03 NOTE — Progress Notes (Signed)
Trigger point injections Cervical, thoracic myofascial trigger points Right C3 Left T7 and left T8 Right T7 and right T8 Bilateral L3 Each injected 1 cc 1% lidocaine. Negative draw back for blood patient tolerated procedure well

## 2012-08-03 NOTE — Patient Instructions (Signed)
Myofascial Pain Syndrome Myofascial pain syndrome is a pain disorder. This pain may be felt in the muscles. It may come and go. Myofascial pain syndrome always has trigger or tender points in the muscle that will cause pain when pressed.  CAUSES Myofascial pain may be caused by injuries, especially auto accidents, or by overuse of certain muscles. Typically the pain is long lasting. It is made worse by overuse of the involved muscles, emotional distress, and by cold, damp weather. Myofascial pain syndrome often develops in patients whose response to stress is an increase in muscle tone, and is seen in greater frequency in patients with pre-existing tension headaches. SYMPTOMS  Myofascial pain syndrome causes a wide variety of symptoms. You may see tight ropy bands of muscle. Problems may also include aching, cramping, burning, numbness, tingling, and other uncomfortable sensations in muscular areas. It most commonly affects the neck, upper back, and shoulder areas. Pain often radiates into the arms and hands.  TREATMENT Treatment includes resting the affected muscular area and applying ice packs to reduce spasm and pain. Trigger point injection, is a valuable initial therapy. This therapy is an injection of local anesthetic directly into the trigger point. Trigger points are often present at the source of pain. Pain relief following injection confirms the diagnosis of myofascial pain syndrome. Fairly vigorous therapy can be carried out during the pain-free period after each injection. Stretching exercises to loosen up the muscles are also useful. Transcutaneous electrical nerve stimulation (TENS) may provide relief from pain. TENS is the use of electric current produced by a device to stimulate the nerves. Ultrasound therapy applied directly over the affected muscle may also provide pain relief. Anti-inflammatory pain medicine can be helpful. Symptoms will gradually improve over a period of weeks to months  with proper treatment. HOME CARE INSTRUCTIONS Call your caregiver for follow-up care as recommended.  SEEK MEDICAL CARE IF:  Your pain is severe and not helped with medications. Document Released: 04/24/2004 Document Revised: 06/09/2011 Document Reviewed: 05/03/2010 [facility] Patient Information 2013 [place], [place].

## 2012-08-10 ENCOUNTER — Other Ambulatory Visit: Payer: Self-pay | Admitting: *Deleted

## 2012-08-10 NOTE — Telephone Encounter (Signed)
Patient last seen in office on 06-09-12. Rx last filled on 07-14-12. Please advise. If approved please print and have pt pick up

## 2012-08-10 NOTE — Telephone Encounter (Signed)
Need chart to see how many she usually gets

## 2012-08-11 MED ORDER — OXYCODONE-ACETAMINOPHEN 5-325 MG PO TABS: 1.0000 | ORAL_TABLET | Freq: Four times a day (QID) | ORAL | Status: DC | PRN

## 2012-08-11 NOTE — Telephone Encounter (Signed)
Chart in your office to advise

## 2012-08-11 NOTE — Telephone Encounter (Signed)
Rx ready for pick up. 

## 2012-08-11 NOTE — Addendum Note (Signed)
Addended by: Bennie Pierini on: 08/11/2012 12:45 PM   Modules accepted: Orders

## 2012-08-11 NOTE — Telephone Encounter (Signed)
Pt aware to pick up rx 

## 2012-08-16 ENCOUNTER — Encounter: Payer: Self-pay | Admitting: Family Medicine

## 2012-08-16 ENCOUNTER — Ambulatory Visit (INDEPENDENT_AMBULATORY_CARE_PROVIDER_SITE_OTHER): Payer: Medicare Other | Admitting: Family Medicine

## 2012-08-16 VITALS — BP 158/79 | HR 59 | Temp 97.4°F | Ht 62.0 in | Wt 126.6 lb

## 2012-08-16 DIAGNOSIS — B354 Tinea corporis: Secondary | ICD-10-CM

## 2012-08-16 DIAGNOSIS — L299 Pruritus, unspecified: Secondary | ICD-10-CM

## 2012-08-16 DIAGNOSIS — L0291 Cutaneous abscess, unspecified: Secondary | ICD-10-CM

## 2012-08-16 DIAGNOSIS — L0390 Cellulitis, unspecified: Secondary | ICD-10-CM

## 2012-08-16 LAB — POCT SKIN KOH: Skin KOH, POC: POSITIVE

## 2012-08-16 MED ORDER — CEPHALEXIN 500 MG PO CAPS: 500.0000 mg | ORAL_CAPSULE | Freq: Three times a day (TID) | ORAL | Status: DC

## 2012-08-16 NOTE — Patient Instructions (Addendum)
Keep all areas clean with peroxide and warm soapy water Use Polysporin ointment after cleansing to her forearm /use Lamisil cream to lesions on birthmark twice daily Take antibiotic as directed

## 2012-08-16 NOTE — Progress Notes (Signed)
  Subjective:    Patient ID: [name], female    DOB: Jun 25, 1955, 57 y.o.   MRN: [id number]  HPI  Patient has noticed areas of redness and bleeding and a birthmark on her left upper arm. This has been going on for 2-3 days. She also has an area of crusting and redness on the forearm of the same arm.   Review of Systems  Skin: Positive for wound (birthmark, L arm, abnormal, bleeding).       Objective:   Physical Exam Large birthmark left upper arm which has 2 areas that are noteworthy. One area is larger and has a central redness surrounded by crusting. A smaller area lateral to this just has a small amount of crust. There is an area about a quarter inch in size on the forearm which looks like more of an infection.  Results for orders placed in visit on 08/16/12  POCT SKIN KOH      Result Value Range   Skin KOH, POC Positive           Assessment & Plan:  1. Itching - POCT Skin KOH  2. Cellulitis of birthmark - cephALEXin (KEFLEX) 500 MG capsule; Take 1 capsule (500 mg total) by mouth 3 (three) times daily.  Dispense: 30 capsule; Refill: 0  3. Tinea corporis -Lamisil Cream twice a day  Patient Instructions  Keep all areas clean with peroxide and warm soapy water Use Polysporin ointment after cleansing to her forearm /use Lamisil cream to lesions on birthmark twice daily Take antibiotic as directed

## 2012-08-31 ENCOUNTER — Other Ambulatory Visit: Payer: Self-pay | Admitting: Nurse Practitioner

## 2012-09-01 ENCOUNTER — Other Ambulatory Visit: Payer: Self-pay

## 2012-09-01 NOTE — Telephone Encounter (Signed)
Pt notified NTBS with regular provider for refills

## 2012-09-01 NOTE — Telephone Encounter (Signed)
Unable to refill. I only saw patient for cellulitis of a birthmark. She'll have to make an appointment with someone here for pain medicine refill .f

## 2012-09-01 NOTE — Telephone Encounter (Signed)
Last filled 5/14 for only #30  Last seen 08/16/12  [facility]  If approved print and have nurse call patient to pick up

## 2012-09-02 ENCOUNTER — Other Ambulatory Visit: Payer: Self-pay | Admitting: Nurse Practitioner

## 2012-09-02 ENCOUNTER — Telehealth: Payer: Self-pay | Admitting: Nurse Practitioner

## 2012-09-03 ENCOUNTER — Telehealth: Payer: Self-pay | Admitting: Family Medicine

## 2012-09-03 ENCOUNTER — Telehealth: Payer: Self-pay | Admitting: *Deleted

## 2012-09-03 NOTE — Telephone Encounter (Signed)
Pt has an appt with mmm on 6/16- but will need meds before that. Dr Ephriam Knuckles has written a note that hes ok with her taking this. Please advise  Pt aware that it wont be ready until at least monday

## 2012-09-03 NOTE — Telephone Encounter (Signed)
Note doesn't say what patient needs

## 2012-09-03 NOTE — Telephone Encounter (Signed)
appt made

## 2012-09-06 ENCOUNTER — Other Ambulatory Visit: Payer: Self-pay | Admitting: *Deleted

## 2012-09-06 MED ORDER — OXYCODONE-ACETAMINOPHEN 5-325 MG PO TABS: ORAL_TABLET | ORAL | Status: DC

## 2012-09-06 NOTE — Telephone Encounter (Signed)
rx ready for pickup 

## 2012-09-06 NOTE — Telephone Encounter (Signed)
LAST RF 08/13/12. SEE ALLERGIES. PLEASE REVIEW. IF APPROVED PLEASE PRINT AND CALL PT WHEN READY. THANKS.

## 2012-09-06 NOTE — Telephone Encounter (Signed)
Pt aware - rx up front 

## 2012-09-07 NOTE — Telephone Encounter (Signed)
Up front for pt

## 2012-09-13 ENCOUNTER — Ambulatory Visit (INDEPENDENT_AMBULATORY_CARE_PROVIDER_SITE_OTHER): Payer: Medicare Other | Admitting: Nurse Practitioner

## 2012-09-13 ENCOUNTER — Encounter: Payer: Self-pay | Admitting: Nurse Practitioner

## 2012-09-13 VITALS — BP 92/50 | HR 62 | Temp 98.8°F | Ht 62.0 in | Wt 126.0 lb

## 2012-09-13 DIAGNOSIS — R55 Syncope and collapse: Secondary | ICD-10-CM | POA: Diagnosis not present

## 2012-09-13 DIAGNOSIS — E039 Hypothyroidism, unspecified: Secondary | ICD-10-CM | POA: Diagnosis not present

## 2012-09-13 LAB — POCT CBC
Lymph, poc: 0.8 (ref 0.6–3.4)
MCH, POC: 30.8 pg (ref 27–31.2)
MCHC: 34.1 g/dL (ref 31.8–35.4)
MPV: 9 fL (ref 0–99.8)
POC LYMPH PERCENT: 12.4 %L (ref 10–50)
Platelet Count, POC: 130 10*3/uL — AB (ref 142–424)

## 2012-09-13 LAB — COMPLETE METABOLIC PANEL WITH GFR
ALT: 176 U/L — ABNORMAL HIGH (ref 0–35)
AST: 421 U/L — ABNORMAL HIGH (ref 0–37)
Creat: 1.36 mg/dL — ABNORMAL HIGH (ref 0.50–1.10)
GFR, Est African American: 51 mL/min — ABNORMAL LOW
Total Bilirubin: 0.9 mg/dL (ref 0.3–1.2)

## 2012-09-13 LAB — THYROID PANEL WITH TSH
Free Thyroxine Index: 3.4 (ref 1.0–3.9)
T3 Uptake: 37.7 % — ABNORMAL HIGH (ref 22.5–37.0)
TSH: 1.662 u[IU]/mL (ref 0.350–4.500)

## 2012-09-13 MED ORDER — CEPHALEXIN 500 MG PO CAPS: 500.0000 mg | ORAL_CAPSULE | Freq: Four times a day (QID) | ORAL | Status: DC

## 2012-09-13 NOTE — Patient Instructions (Signed)
Syncope  Syncope is a fainting spell. This means the person loses consciousness and drops to the ground. The person is generally unconscious for less than 5 minutes. The person may have some muscle twitches for up to 15 seconds before waking up and returning to normal. Syncope occurs more often in elderly people, but it can happen to anyone. While most causes of syncope are not dangerous, syncope can be a sign of a serious medical problem. It is important to seek medical care.   CAUSES   Syncope is caused by a sudden decrease in blood flow to the brain. The specific cause is often not determined. Factors that can trigger syncope include:   Taking medicines that lower blood pressure.   Sudden changes in posture, such as standing up suddenly.   Taking more medicine than prescribed.   Standing in one place for too long.   Seizure disorders.   Dehydration and excessive exposure to heat.   Low blood sugar (hypoglycemia).   Straining to have a bowel movement.   Heart disease, irregular heartbeat, or other circulatory problems.   Fear, emotional distress, seeing blood, or severe pain.  SYMPTOMS   Right before fainting, you may:   Feel dizzy or lightheaded.   Feel nauseous.   See all white or all black in your field of vision.   Have cold, clammy skin.  DIAGNOSIS   Your caregiver will ask about your symptoms, perform a physical exam, and perform electrocardiography (ECG) to record the electrical activity of your heart. Your caregiver may also perform other heart or blood tests to determine the cause of your syncope.  TREATMENT   In most cases, no treatment is needed. Depending on the cause of your syncope, your caregiver may recommend changing or stopping some of your medicines.  HOME CARE INSTRUCTIONS   Have someone stay with you until you feel stable.   Do not drive, operate machinery, or play sports until your caregiver says it is okay.   Keep all follow-up appointments as directed by your  caregiver.   Lie down right away if you start feeling like you might faint. Breathe deeply and steadily. Wait until all the symptoms have passed.   Drink enough fluids to keep your urine clear or pale yellow.   If you are taking blood pressure or heart medicine, get up slowly, taking several minutes to sit and then stand. This can reduce dizziness.  SEEK IMMEDIATE MEDICAL CARE IF:    You have a severe headache.   You have unusual pain in the chest, abdomen, or back.   You are bleeding from the mouth or rectum, or you have black or tarry stool.   You have an irregular or very fast heartbeat.   You have pain with breathing.   You have repeated fainting or seizure-like jerking during an episode.   You faint when sitting or lying down.   You have confusion.   You have difficulty walking.   You have severe weakness.   You have vision problems.  If you fainted, call your local emergency services (911 in [place] Do not drive yourself to the hospital.   MAKE SURE YOU:   Understand these instructions.   Will watch your condition.   Will get help right away if you are not doing well or get worse.  Document Released: 03/17/2005 Document Revised: 09/16/2011 Document Reviewed: 05/16/2011  [employer] Patient Information 2014 [employer].

## 2012-09-13 NOTE — Addendum Note (Signed)
Addended by: Bennie Pierini on: 09/13/2012 11:53 AM   Modules accepted: Orders

## 2012-09-13 NOTE — Progress Notes (Addendum)
  Subjective:    Patient ID: [name], female    DOB: 1956/01/13, 57 y.o.   MRN: [id number]  HPI Patient was in today for CPE and PAP but she had a black put spell when she got here She has these episodes frequently- related to her tachycardia- Sees cardiologist- and he is aware of her episodes. Usually resolves on it's own- Has follow up with cardiologist in a couple of weeks.    Review of Systems  All other systems reviewed and are negative.       Objective:   Physical Exam  Constitutional: She appears well-developed and well-nourished.  Cardiovascular: Normal rate, normal heart sounds and intact distal pulses.   Pulmonary/Chest: Effort normal and breath sounds normal.  Skin: Skin is warm.  Psychiatric: She has a normal mood and affect. Her behavior is normal. Judgment and thought content normal.    BP 92/50  Pulse 62  Temp(Src) 98.8 F (37.1 C) (Oral)  Ht 5\' 2"  (1.575 m)  Wt 126 lb (57.153 kg)  BMI 23.04 kg/m2  SpO2 94% EKG- Sinus bradycardia       Assessment & Plan:  1. Black-out (not amnesia) Labs pending - EKG 12-Lead - POCT CBC - COMPLETE METABOLIC PANEL WITH GFR - Thyroid Panel With TSH  2. Syncope Rest No strenous activity Keep F/U appointment cardiologist  3.Cellulitis right lower leg Keflex 500mg  1 PO TID X 10 days Avoid rubbing or scratching Clean with antibacterial soap BID  [name] Daphine Deutscher, FNP

## 2012-09-14 ENCOUNTER — Telehealth: Payer: Self-pay | Admitting: Nurse Practitioner

## 2012-09-14 NOTE — Telephone Encounter (Signed)
See other telephone encounter from 09/14/12.

## 2012-09-14 NOTE — Telephone Encounter (Signed)
Patient complains of a copious amount of clear to white vaginal discharge with mild odor 5 days.  No itching or irritation.  Does have some mild abd discomfort today.  She is also due for a repeat pap smear.    Appt scheduled for tomorrow. Discussed s/s of PID and instructed patient to call back or go to ER if any of these symptoms develop. Patient stated understanding and agreement to plan.

## 2012-09-15 ENCOUNTER — Encounter: Payer: Self-pay | Admitting: Nurse Practitioner

## 2012-09-15 ENCOUNTER — Ambulatory Visit (INDEPENDENT_AMBULATORY_CARE_PROVIDER_SITE_OTHER): Payer: Medicare Other | Admitting: Nurse Practitioner

## 2012-09-15 VITALS — BP 141/78 | HR 62 | Temp 98.2°F | Ht 62.0 in | Wt 127.0 lb

## 2012-09-15 DIAGNOSIS — N760 Acute vaginitis: Secondary | ICD-10-CM

## 2012-09-15 DIAGNOSIS — N898 Other specified noninflammatory disorders of vagina: Secondary | ICD-10-CM

## 2012-09-15 DIAGNOSIS — R300 Dysuria: Secondary | ICD-10-CM

## 2012-09-15 LAB — POCT URINALYSIS DIPSTICK
Glucose, UA: NEGATIVE
Nitrite, UA: NEGATIVE
Spec Grav, UA: 1.025
Urobilinogen, UA: NEGATIVE

## 2012-09-15 LAB — POCT WET PREP (WET MOUNT): Trichomonas Wet Prep HPF POC: NEGATIVE

## 2012-09-15 LAB — POCT UA - MICROSCOPIC ONLY: Yeast, UA: NEGATIVE

## 2012-09-15 MED ORDER — NITROFURANTOIN MONOHYD MACRO 100 MG PO CAPS: 100.0000 mg | ORAL_CAPSULE | Freq: Two times a day (BID) | ORAL | Status: DC

## 2012-09-15 MED ORDER — FLUCONAZOLE 150 MG PO TABS: 150.0000 mg | ORAL_TABLET | Freq: Once | ORAL | Status: DC

## 2012-09-15 NOTE — Progress Notes (Signed)
  Subjective:    Patient ID: [name], female    DOB: 10-Mar-1956, 57 y.o.   MRN: [id number]  HPI Patient in saying that she has a vaginal discharge- Yellowish with brown tinge- No vaginal itching.    Review of Systems  All other systems reviewed and are negative.       Objective:   Physical Exam  Constitutional: She appears well-developed and well-nourished.  Cardiovascular: Normal rate and normal heart sounds.   Pulmonary/Chest: Effort normal and breath sounds normal.  Abdominal: Soft. Bowel sounds are normal.    Results for orders placed in visit on 09/15/12  POCT URINALYSIS DIPSTICK      Result Value Range   Color, UA yellow     Clarity, UA clear     Glucose, UA neg     Bilirubin, UA neg     Ketones, UA neg     Spec Grav, UA 1.025     Blood, UA trace     pH, UA 5.0     Protein, UA mod     Urobilinogen, UA negative     Nitrite, UA neg     Leukocytes, UA Trace    POCT UA - MICROSCOPIC ONLY      Result Value Range   WBC, Ur, HPF, POC 2-6     RBC, urine, microscopic 1-10     Bacteria, U Microscopic few     Mucus, UA mod     Epithelial cells, urine per micros occ     Crystals, Ur, HPF, POC neg     Casts, Ur, LPF, POC few     Yeast, UA neg    POCT WET PREP (WET M[place])      Result Value Range   WBC, Wet Prep HPF POC       Bacteria Wet Prep HPF POC many     BACTERIA WET PREP MORPHOLOGY POC       Clue Cells Wet Prep HPF POC None     CLUE CELLS WET PREP WHIFF POC       Yeast Wet Prep HPF POC None     KOH Wet Prep POC       Trichomonas Wet Prep HPF POC neg           Assessment & Plan:   1. Dysuria   2. Vaginal discharge   3. Vaginitis and vulvovaginitis     Orders Placed This Encounter  Procedures  . Urine culture  . GC/chlamydia probe amp, urine  . POCT urinalysis dipstick  . POCT UA - Microscopic Only  . POCT Wet Prep [facility])   Meds ordered this encounter  Medications  . nitrofurantoin, macrocrystal-monohydrate, (MACROBID) 100  MG capsule    Sig: Take 1 capsule (100 mg total) by mouth 2 (two) times daily.    Dispense:  14 capsule    Refill:  0    Order Specific Question:  Supervising Provider    Answer:  Ernestina Penna [[id number]]  . fluconazole (DIFLUCAN) 150 MG tablet    Sig: Take 1 tablet (150 mg total) by mouth once.    Dispense:  1 tablet    Refill:  0    Order Specific Question:  Supervising Provider    Answer:  Ernestina Penna [[id number]]   SAfe sex G/C pending RTO prn  Erica Daphine Deutscher, FNP

## 2012-09-27 ENCOUNTER — Other Ambulatory Visit: Payer: Self-pay | Admitting: Nurse Practitioner

## 2012-10-05 ENCOUNTER — Telehealth: Payer: Self-pay | Admitting: Nurse Practitioner

## 2012-10-05 ENCOUNTER — Other Ambulatory Visit: Payer: Self-pay | Admitting: Nurse Practitioner

## 2012-10-07 NOTE — Telephone Encounter (Signed)
Rx called in to vm per verbal.

## 2012-10-07 NOTE — Telephone Encounter (Signed)
Last filled 07/22/12

## 2012-10-08 ENCOUNTER — Other Ambulatory Visit: Payer: Medicare Other

## 2012-10-08 DIAGNOSIS — R300 Dysuria: Secondary | ICD-10-CM | POA: Diagnosis not present

## 2012-10-08 DIAGNOSIS — N898 Other specified noninflammatory disorders of vagina: Secondary | ICD-10-CM | POA: Diagnosis not present

## 2012-10-08 DIAGNOSIS — N760 Acute vaginitis: Secondary | ICD-10-CM | POA: Diagnosis not present

## 2012-10-08 MED ORDER — OXYCODONE-ACETAMINOPHEN 5-325 MG PO TABS: ORAL_TABLET | ORAL | Status: DC

## 2012-10-08 NOTE — Telephone Encounter (Signed)
Aware. 

## 2012-10-08 NOTE — Telephone Encounter (Signed)
rx ready for pickup 

## 2012-10-08 NOTE — Telephone Encounter (Signed)
Calling for urine culture results and patient aware she needs to come by and leave urine culture

## 2012-10-08 NOTE — Telephone Encounter (Signed)
Patient needs refill on her percocet.

## 2012-10-11 LAB — GC/CHLAMYDIA PROBE AMP, URINE
Chlamydia, Swab/Urine, PCR: NEGATIVE
GC Probe Amp, Urine: NEGATIVE

## 2012-10-21 ENCOUNTER — Ambulatory Visit: Payer: Medicare Other | Admitting: Physical Medicine & Rehabilitation

## 2012-10-22 ENCOUNTER — Telehealth: Payer: Self-pay

## 2012-10-22 NOTE — Telephone Encounter (Signed)
Patient called upset that she was not able to be seen today due to being late.  She would like a call.

## 2012-10-22 NOTE — Telephone Encounter (Signed)
Left message returning patient call about being late for her appointment.

## 2012-10-22 NOTE — Telephone Encounter (Signed)
Patient called again regarding her appointment she showed up late for.

## 2012-10-28 ENCOUNTER — Ambulatory Visit: Payer: Medicare Other | Admitting: Physical Medicine & Rehabilitation

## 2012-11-02 ENCOUNTER — Other Ambulatory Visit: Payer: Self-pay | Admitting: Nurse Practitioner

## 2012-11-02 ENCOUNTER — Other Ambulatory Visit: Payer: Self-pay | Admitting: *Deleted

## 2012-11-02 MED ORDER — OXYCODONE-ACETAMINOPHEN 5-325 MG PO TABS: ORAL_TABLET | ORAL | Status: DC

## 2012-11-02 NOTE — Telephone Encounter (Signed)
rx ready for pickup 

## 2012-11-02 NOTE — Telephone Encounter (Signed)
Patient aware rx up front to pick up 

## 2012-11-02 NOTE — Telephone Encounter (Signed)
Patient last seen in office on 09-15-12. Rx last filled on 10-08-12 for #30. Please advise. If approved please print and route to Pool B so nurse can contact pt to pick up

## 2012-11-03 ENCOUNTER — Encounter: Payer: Self-pay | Admitting: *Deleted

## 2012-11-03 ENCOUNTER — Telehealth: Payer: Self-pay | Admitting: *Deleted

## 2012-11-03 ENCOUNTER — Encounter (INDEPENDENT_AMBULATORY_CARE_PROVIDER_SITE_OTHER): Payer: Medicare Other

## 2012-11-03 ENCOUNTER — Ambulatory Visit (HOSPITAL_BASED_OUTPATIENT_CLINIC_OR_DEPARTMENT_OTHER): Payer: Medicare Other | Attending: Physician Assistant

## 2012-11-03 ENCOUNTER — Encounter: Payer: Self-pay | Admitting: Physician Assistant

## 2012-11-03 ENCOUNTER — Ambulatory Visit (INDEPENDENT_AMBULATORY_CARE_PROVIDER_SITE_OTHER): Payer: Medicare Other | Admitting: Physician Assistant

## 2012-11-03 VITALS — BP 110/67 | HR 67 | Ht 62.0 in | Wt 136.0 lb

## 2012-11-03 VITALS — Ht 62.0 in | Wt 135.0 lb

## 2012-11-03 DIAGNOSIS — R079 Chest pain, unspecified: Secondary | ICD-10-CM

## 2012-11-03 DIAGNOSIS — I2510 Atherosclerotic heart disease of native coronary artery without angina pectoris: Secondary | ICD-10-CM

## 2012-11-03 DIAGNOSIS — R5383 Other fatigue: Secondary | ICD-10-CM

## 2012-11-03 DIAGNOSIS — R0609 Other forms of dyspnea: Secondary | ICD-10-CM | POA: Diagnosis not present

## 2012-11-03 DIAGNOSIS — I10 Essential (primary) hypertension: Secondary | ICD-10-CM | POA: Diagnosis not present

## 2012-11-03 DIAGNOSIS — G4710 Hypersomnia, unspecified: Secondary | ICD-10-CM | POA: Insufficient documentation

## 2012-11-03 DIAGNOSIS — R7989 Other specified abnormal findings of blood chemistry: Secondary | ICD-10-CM

## 2012-11-03 DIAGNOSIS — R5381 Other malaise: Secondary | ICD-10-CM

## 2012-11-03 DIAGNOSIS — R0989 Other specified symptoms and signs involving the circulatory and respiratory systems: Secondary | ICD-10-CM

## 2012-11-03 DIAGNOSIS — G4733 Obstructive sleep apnea (adult) (pediatric): Secondary | ICD-10-CM

## 2012-11-03 DIAGNOSIS — R0683 Snoring: Secondary | ICD-10-CM

## 2012-11-03 DIAGNOSIS — R55 Syncope and collapse: Secondary | ICD-10-CM

## 2012-11-03 DIAGNOSIS — R0600 Dyspnea, unspecified: Secondary | ICD-10-CM

## 2012-11-03 DIAGNOSIS — R031 Nonspecific low blood-pressure reading: Secondary | ICD-10-CM

## 2012-11-03 DIAGNOSIS — G4730 Sleep apnea, unspecified: Secondary | ICD-10-CM | POA: Insufficient documentation

## 2012-11-03 LAB — HEPATIC FUNCTION PANEL
Alkaline Phosphatase: 64 U/L (ref 39–117)
Bilirubin, Direct: 0 mg/dL (ref 0.0–0.3)
Total Bilirubin: 0.3 mg/dL (ref 0.3–1.2)

## 2012-11-03 LAB — BASIC METABOLIC PANEL
Calcium: 8.7 mg/dL (ref 8.4–10.5)
GFR: 78.85 mL/min (ref >60.00)
Glucose, Bld: 83 mg/dL (ref 70–99)
Potassium: 3.3 mEq/L — ABNORMAL LOW (ref 3.5–5.1)
Sodium: 141 mEq/L (ref 135–145)

## 2012-11-03 LAB — BRAIN NATRIURETIC PEPTIDE: Pro B Natriuretic peptide (BNP): 27 pg/mL (ref 0.0–100.0)

## 2012-11-03 MED ORDER — ATENOLOL 25 MG PO TABS: 25.0000 mg | ORAL_TABLET | Freq: Every day | ORAL | Status: DC

## 2012-11-03 MED ORDER — ROSUVASTATIN CALCIUM 20 MG PO TABS: 20.0000 mg | ORAL_TABLET | Freq: Every day | ORAL | Status: DC

## 2012-11-03 NOTE — Telephone Encounter (Signed)
Appt scheduled. Pt aware

## 2012-11-03 NOTE — Progress Notes (Unsigned)
Patient ID: [name], female   DOB: 11/08/1955, 57 y.o.   MRN: [id number] 24 Hour ambulatory blood pressure monitor applied to patient.

## 2012-11-03 NOTE — Patient Instructions (Addendum)
DECREASE ATENOLOL TO 25MG  DAILY  HOLD CRESTOR UNTIL YOU FOLLOW UP WITH YOUR PRIMARY CARE DOCTOR  LABS TODAY BMET, BNP, LFT, 24 HR URINE  Your physician has recommended that you have a sleep study. This test records several body functions during sleep, including: brain activity, eye movement, oxygen and carbon dioxide blood levels, heart rate and rhythm, breathing rate and rhythm, the flow of air through your mouth and nose, snoring, body muscle movements, and chest and belly movement.  Your physician has requested that you wear a 24HR BLOOD PRESSURE MONITOR  KRISTINE FROM YOUR PCP OFFICE WILL CALL YOU TODAY TO SCHEDULE YOUR APPT TO BE SEEN WITHIN 1 WEEK   Your physician recommends that you schedule a follow-up appointment in: 6 WEEKS WITH DR.TAYLOR

## 2012-11-03 NOTE — Progress Notes (Signed)
[address] [place], [place]  [address] Phone: [phone] Fax:  [phone]  Date:  11/03/2012   Name:  [name]   DOB:  Aug 15, 1955   MRN:  [id number]  PCP:  Rudi Heap, MD  Cardiologist:  Dr. [name]    History of Present Illness: [name] is a 57 y.o. female who returns for evaluation of her BP and syncope.   She has a hx of unexplained syncope, s/p implantable loop recorder.   She has had sinus tachycardia and questionable SVT.   Echo 04/2007: Normal LV function.  Lexiscan Myoview 06/05/11: Normal, no scar or ischemia, EF 73%.  She was seen in the ED at [facility] 09/26/11 with complaints of chest pain.  She was then set up for cardiac cath.  LHC 09/2011: no CAD, EF 65%.  No further ischemic evaluation warranted.  Last seen by Dr. [name] in 11/2011.  Her ILR was at end of life.  She had no significant arrhythmias that correlated with her syncope.    She has had wide fluctuations in her BP over the last 2 mos.  She tells me her BP was as low as 47/40 (???) by her machine at home in June.  She is very tired when her BP is low.  She notes increased BPs last month up to 150/100.  She feels dizzy when her BP is high.  She feels like she will pass out with this. She has had more episodes of "blackouts."  She apparently had one prior to going to an appt with her PCP in June.  She tells me she can unconscious for 15 minutes.  No CPR has been started.  She is difficult to arouse.  No witnessed seizure activity.  She denies loss of bowel or bladder fxn.  She tells me she had an EEG years ago when these first started.  She sometimes feels lightheaded prior to passing out.  She has had not warning at other times.  She feels quite sleepy when she regains consciousness.  She has vomited on one or 2 occasions in the past when regaining consciousness.  She sometimes has chest pressure with her episodes of syncope.  Also note rapid  palpitations at times.  She denies ever injuring herself. She denies exertional CP.  She does note DOE.   Labs (7/13):  K 3.7, Cr 0.8 Labs (6/14):  K 4.4, Cr 1.36, AST 421, ALT 176, Hgb 12.6, TSH 1.662  Past Medical History  Diagnosis Date  . Anxiety   . Herpes simplex infection   . Osteoporosis   . Sleep apnea   . Hypertension   . Dyslipidemia   . Depression   . Asthma   . Fibromyalgia   . GERD (gastroesophageal reflux disease)   . Head injury, unspecified   . Supraventricular tachycardia   . Syncope and collapse     s/p ILR; no arhythmogenic cause identified  . Coronary artery disease     reported hx of "MI";  Echo 2009 with normal LVF;  Myoview 05/2011: no ischemia  . Spondylolisthesis   . Spinal stenosis of lumbar region   . Hyperlipidemia   . Bipolar 1 disorder   . Insomnia   . Arthritis     RHEUMATOID    Current Outpatient Prescriptions  Medication Sig Dispense Refill  . acyclovir ointment (ZOVIRAX) 5 % APPLY TO AFFECTED AREA AS NEEDED.  30 g  1  . albuterol (PROAIR HFA) 108 (  90 BASE) MCG/ACT inhaler Inhale 2 puffs into the lungs every 6 (six) hours as needed.       . ALPRAZolam (XANAX) 1 MG tablet Take 1 mg by mouth 4 (four) times daily as needed.       [name] atenolol (TENORMIN) 25 MG tablet TAKE (1) TABLET TWICE DAILY.  60 tablet  4  . butalbital-acetaminophen-caffeine (FIORICET, ESGIC) 50-325-40 MG per tablet TAKE 1 TABLET BY MOUTH EVERY 6 HOURS AS NEEDED FOR HEADACHE.  40 tablet  0  . cephALEXin (KEFLEX) 500 MG capsule Take 1 capsule (500 mg total) by mouth 3 (three) times daily.  30 capsule  0  . cephALEXin (KEFLEX) 500 MG capsule Take 1 capsule (500 mg total) by mouth 4 (four) times daily.  30 capsule  0  . CYMBALTA 60 MG capsule Take 1 capsule by mouth at bedtime.      . fluconazole (DIFLUCAN) 150 MG tablet Take 1 tablet (150 mg total) by mouth once.  1 tablet  0  . naproxen (NAPROSYN) 500 MG tablet       . naproxen (NAPROSYN) 500 MG tablet TAKE 1 TABLET BY MOUTH  TWICE DAILY WITH MEALS.  60 tablet  2  . nitrofurantoin, macrocrystal-monohydrate, (MACROBID) 100 MG capsule Take 1 capsule (100 mg total) by mouth 2 (two) times daily.  14 capsule  0  . nitroGLYCERIN (NITROLINGUAL) 0.4 MG/SPRAY spray USE (1) SPRAY UNDER TONGUE AS NEEDED FOR CHEST PAIN.  12 g  0  . nitroGLYCERIN (NITROSTAT) 0.4 MG SL tablet Place 0.4 mg under the tongue every 5 (five) minutes as needed.        [name] omeprazole (PRILOSEC) 40 MG capsule TAKE 1 CAPSULE DAILY.  30 capsule  1  . oxyCODONE-acetaminophen (PERCOCET/ROXICET) 5-325 MG per tablet TAKE ONE TABLET EVERY 4-6 HOURS AS NEEDED  30 tablet  0  . pantoprazole (PROTONIX) 40 MG tablet TAKE 1 TABLET ONCE DAILY.  30 tablet  5  . QUEtiapine (SEROQUEL) 50 MG tablet Take 1 tablet by mouth at bedtime.      . rosuvastatin (CRESTOR) 20 MG tablet Take 1 tablet (20 mg total) by mouth daily.  30 tablet  4  . tiZANidine (ZANAFLEX) 4 MG tablet Take 1 tablet (4 mg total) by mouth 3 (three) times daily.  90 tablet  1  . traZODone (DESYREL) 100 MG tablet Take 200 mg by mouth daily.        . valACYclovir (VALTREX) 500 MG tablet Take 500 mg by mouth 2 (two) times daily.         No current facility-administered medications for this visit.    Allergies: Allergies  Allergen Reactions  . Codeine Other (See Comments)    "I will have a heart attack."  . Morphine And Related Other (See Comments)    "It will cause me to have a heart attack."     Social History:  The patient  reports that she has never smoked. She has never used smokeless tobacco. She reports that she does not drink alcohol or use illicit drugs.    ROS:  Please see the history of present illness.  She admits to significant snoring and daytime hypersomnolence.  No abdominal pain.  No melena, hematochezia.  She has been concerned about fluctuations in her weight and swelling in her hands, feet and face.  Also notes nodular skin lesions that arise spontaneously and resolve after several weeks.    All other systems reviewed and negative.   PHYSICAL EXAM: VS:  BP 110/67  Pulse 67  Ht 5\' 2"  (1.575 m)  Wt 136 lb (61.689 kg)  BMI 24.87 kg/m2  Filed Vitals:   11/03/12 1045 11/03/12 1046 11/03/12 1047 11/03/12 1048  BP: 115/72 110/73 105/71 110/67  Pulse: 67 77 67 67  Height:      Weight:         Well nourished, well developed, in no acute distress HEENT: normal Neck: no JVD Endo: no thyromegaly Cardiac:  normal S1, S2; RRR; no murmur Lungs:  clear to auscultation bilaterally, no wheezing, rhonchi or rales Abd: soft, nontender, no hepatomegaly Ext: no edema Skin: warm and dry Neuro:  CNs 2-12 intact, no focal abnormalities noted  EKG:   NSR, HR 70, normal axis, NSSTTW changes, QTc 442    ASSESSMENT AND PLAN:  1. Syncope:  Etiology not clear.  She had an ILR implanted without significant findings.  Description of her episodes are not suggestive of cardiac etiology.  She has had no witnessed seizure activity.  She may be describing a post ictal state.  She tells me she saw neurology at one point and had an EEG.  She tells me her workup was negative.  She has symptoms suggestive of OSA.  Question if these episodes are related (ie falling asleep instead of passing out).  I will arrange a sleep study.  BPs have been fluctuating.  I will reduce Atenolol to 25 mg QD.  I will arrange a 24 hr BP monitor.  I will also arrange a 24 hour urine for catecholamines, VMA, metanephrines.  Of note, orthostatic VS normal today.  I considered getting an event monitor.  However, she had an ILR for several years without significant findings.  I will hold off on this and have her follow up with Dr. [name] first.  2. Dyspnea:  She is concerned about significant weight gain and edema.  Recent TSH ok.  Will check BMET and BNP.  EF has been normal in the past.  If BNP normal, no further workup. 3. Hypertension:  She has had fluctuations in BP.  Check BP monitor, 24 hour urine and reduce dose of  Atenolol as noted.   4. Elevated LFTs:  She needs further evaluation with her PCP.  I will arrange f/u.  Repeat LFTs today.  I have asked her to hold her Crestor until she is seen in follow up.   5. Snoring:  Arrange sleep study to assess for OSA.  6. Disposition:  F/u with Dr. [name] in 6 weeks.    Signed, Tereso Newcomer, PA-C   11/03/2012 10:49 AM

## 2012-11-04 ENCOUNTER — Ambulatory Visit (INDEPENDENT_AMBULATORY_CARE_PROVIDER_SITE_OTHER): Payer: Medicare Other | Admitting: Nurse Practitioner

## 2012-11-04 ENCOUNTER — Other Ambulatory Visit: Payer: Self-pay | Admitting: Nurse Practitioner

## 2012-11-04 ENCOUNTER — Encounter: Payer: Self-pay | Admitting: Nurse Practitioner

## 2012-11-04 VITALS — BP 134/70 | HR 61 | Temp 98.0°F | Wt 136.5 lb

## 2012-11-04 DIAGNOSIS — R7989 Other specified abnormal findings of blood chemistry: Secondary | ICD-10-CM | POA: Diagnosis not present

## 2012-11-04 DIAGNOSIS — E876 Hypokalemia: Secondary | ICD-10-CM

## 2012-11-04 NOTE — Progress Notes (Signed)
  Subjective:    Patient ID: [name], female    DOB: 1955/07/07, 56 y.o.   MRN: [id number]  HPI  Patient here because she was told that her LFT'S are elevated- She was told to stop crestor an dhave labs repeated. She is doing ok- No C/O abdominal pain- No jaundice    Review of Systems  All other systems reviewed and are negative.       Objective:   Physical Exam  Constitutional: She is oriented to person, place, and time. She appears well-developed and well-nourished.  HENT:  Nose: Nose normal.  Mouth/Throat: Oropharynx is clear and moist.  Eyes: EOM are normal.  Neck: Trachea normal, normal range of motion and full passive range of motion without pain. Neck supple. No JVD present. Carotid bruit is not present. No thyromegaly present.  Cardiovascular: Normal rate, regular rhythm, normal heart sounds and intact distal pulses.  Exam reveals no gallop and no friction rub.   No murmur heard. Pulmonary/Chest: Effort normal and breath sounds normal.  Abdominal: Soft. Bowel sounds are normal. She exhibits no distension and no mass. There is no tenderness.  Musculoskeletal: Normal range of motion.  Lymphadenopathy:    She has no cervical adenopathy.  Neurological: She is alert and oriented to person, place, and time. She has normal reflexes.  Skin: Skin is warm and dry.  Psychiatric: She has a normal mood and affect. Her behavior is normal. Judgment and thought content normal.     BP 134/70  Pulse 61  Temp(Src) 98 F (36.7 C) (Oral)  Wt 136 lb 8 oz (61.916 kg)  BMI 24.96 kg/m2      Assessment & Plan:  1. Elevated LFTs Labs pending Will talk when get labs back  [name] Daphine Deutscher, FNP

## 2012-11-04 NOTE — Patient Instructions (Signed)
Liver Panel A liver panel, also known as liver (hepatic) function tests or LFT, is used to detect liver damage or disease. One or more of these tests are ordered when symptoms suspicious of a liver condition are noticed. These include: jaundice, dark urine, or light-colored bowel movements; nausea, vomiting and/or diarrhea; loss of appetite; vomiting of blood; bloody or black bowel movements; swelling or pain in the belly; unusual weight change; or fatigue or loss of stamina. One or more of these tests may also be ordered when a person has been or may have been exposed to a hepatitis virus; has a family history of liver disease; has excessive alcohol intake; or is taking a drug that can cause liver damage. A liver panel usually includes 7 tests that are run at the same time on a blood sample. These include:  Alanine aminotransferase (ALT)  an enzyme mainly found in the liver; the best test for detecting hepatitis.  NORMAL FINDINGS  Elderly: may be slightly higher than adult values  Adult/child: 4-36 international units/L at 37 C or4-36 units/L (SI units)  Values may be higher in men and in African Americans.  Infant: may be twice as high as adult values. Alkaline phosphatase (ALP)  an enzyme related to the bile ducts; often increased when they are blocked NORMAL FINDINGS  Elderly: slightly higher than an adult.  Adult: 30-120 units/L or 0.5-2.0 microKat/L (SI units)  Child:/adolescent:  Less than 2 years: 85-235 units/L  2-8 Years: 65-210 units/L  9-15 years: 60-300 units/L  16-21 years: 30-200 units/L Aspartate aminotransferase (AST)  an enzyme found in the liver and a few other places, particularly the heart and other muscles in the body. NORMAL FINDINGS  Age / Normal value (units)  0-5 days / 35-140  Less than 3 yr / 15-60  3-6 yr / 15-50  6-12 yr / 10-50  12-18 yr / 10-40  Adult / 0-35 units/L or 0-0.58 microKat/L (SI units) (Females tend to have slightly lower than  males)  Elderly / Slightly higher than adults. Bilirubin  two different tests of bilirubin often used together (especially if a person has jaundice): total bilirubin measures all the bilirubin in the blood; direct bilirubin measures a form made in the liver.   Adult/elderly/child  Total bilirubin: 0.3-1.0 mg/dL or 5.1-17 micromole/L (SI units)  Indirect bilirubin: 0.2-0.8 mg/dL or 3.4-12.0 micromole/L (SI units)  Direct bilirubin: 0.1-0.3 mg/dL or 1.7-5.1 micromole/L (SI units)  Newborn total bilirubin:1.0-12.0 mg/dL or17.1-205 micromole/L (SI units)  Urine: 0.0-0.2 mg/dL Albumin  measures the main protein made by the liver and tells how well the liver is making this protein  Total Protein - measures albumin and all other proteins in blood, including antibodies made to help fight off infections.  NORMAL FINDINGS  Adult/Elderly  Total protein: 6.4-8.3 g/dL or 64-83 g/L (SI units)  Albumin: 3.5-5 g/dL or 35-50 g/L (SI units)  Globulin: 2.3-3.4 g/dL  Alpha1 globulin: 0.1-0.3 g/dL or 1-3 g/L (SI units)  Alpha2 globulin: 0.6-1 g/dL or 6-10 g/L (SI units)  Beta globulin: 0.7-1.1 g/dL or 7-11 g/L (SI units) Children  Total protein  Premature infant: 4.2-7.6 g/dL  Newborn: 4.6-7.4 g/dL  Infant: 6-6.7 g/dL  Child: 6.2-8 g/dL  Albumin  Premature infant: 3-4.2 g/dL  Newborn: 3.5-5.4 g/dL  Infant: 4.4-5.4 g/dL  Child: 4-5.9 g/dL Ranges for normal findings may vary among different laboratories and hospitals. You should always check with your doctor after having lab work or other tests done to discuss the meaning of your   test results and whether your values are considered within normal limits. MEANING OF TEST  Your caregiver will go over the test results with you and discuss the importance and meaning of your results, as well as treatment options and the need for additional tests if necessary. OBTAINING THE TEST RESULTS It is your responsibility to obtain your test results.  Ask the lab or department performing the test when and how you will get your results. Document Released: 04/11/2004 Document Revised: 06/09/2011 Document Reviewed: 02/28/2008 Exit[employer] Patient Information 2014 [employer].  

## 2012-11-05 ENCOUNTER — Other Ambulatory Visit: Payer: Medicare Other | Admitting: Nurse Practitioner

## 2012-11-05 ENCOUNTER — Telehealth: Payer: Self-pay | Admitting: Nurse Practitioner

## 2012-11-05 ENCOUNTER — Other Ambulatory Visit: Payer: Medicare Other

## 2012-11-05 DIAGNOSIS — R7989 Other specified abnormal findings of blood chemistry: Secondary | ICD-10-CM | POA: Diagnosis not present

## 2012-11-05 DIAGNOSIS — I10 Essential (primary) hypertension: Secondary | ICD-10-CM | POA: Diagnosis not present

## 2012-11-05 LAB — HEPATIC FUNCTION PANEL
AST: 21 IU/L (ref 0–40)
Alkaline Phosphatase: 85 IU/L (ref 39–117)

## 2012-11-06 NOTE — Telephone Encounter (Signed)
Patient aware.

## 2012-11-11 LAB — METANEPHRINES, URINE, 24 HOUR
Metanephrines, Ur: 35 mcg/24 h — ABNORMAL LOW (ref 90–315)
Normetanephrine, 24H Ur: 224 mcg/24 h (ref 122–676)

## 2012-11-14 DIAGNOSIS — G4730 Sleep apnea, unspecified: Secondary | ICD-10-CM

## 2012-11-14 DIAGNOSIS — G4710 Hypersomnia, unspecified: Secondary | ICD-10-CM | POA: Diagnosis not present

## 2012-11-14 DIAGNOSIS — R0609 Other forms of dyspnea: Secondary | ICD-10-CM | POA: Diagnosis not present

## 2012-11-14 DIAGNOSIS — R0989 Other specified symptoms and signs involving the circulatory and respiratory systems: Secondary | ICD-10-CM

## 2012-11-14 NOTE — Procedures (Signed)
[name], [name]             ACCOUNT NO.:  [id number]  MEDICAL RECORD NO.:  [id number]          PATIENT TYPE:  OUT  LOCATION:  SLEEP CENTER                 FACILITY[facility]  [facility]  PHYSICIAN:  Barbaraann Share, MD,FCCPDATE OF BIRTH:  05[birth year]  DATE OF STUDY:                           NOCTURNAL POLYSOMNOGRAM  REFERRING PHYSICIAN:  SCOTT D WEAVER  REFERRING PHYSICIAN:  Tereso Newcomer, PA-C  INDICATION FOR STUDY:  Hypersomnia with sleep apnea.  EPWORTH SLEEPINESS SCORE:  10.  MEDICATIONS:  SLEEP ARCHITECTURE:  The patient had a total sleep time of 321 minute with no slow-wave sleep and only 2 minutes of REM.  Sleep onset latency was normal at 17 minutes and REM onset was prolonged at 311 minutes. Sleep efficiency was mildly decreased at 84%.  RESPIRATORY DATA:  The patient was found to have no obstructive apneas and only 1 obstructive hypopnea, giving her an apnea-hypopnea index of 0.2 events per hour.  There was moderate snoring noted throughout.  OXYGEN DATA:  There was transient oxygen desaturation as low as 91% with the patient's obstructive event.  CARDIAC DATA:  Occasional PVC noted, but no clinically significant arrhythmias were seen.  MOVEMENT-PARASOMNIA:  The patient had a few leg jerks noted, but they were not periodic in nature.  There were no abnormal behavior seen.  IMPRESSIONS-RECOMMENDATIONS: 1. Small numbers of obstructive events, which do not meet the AHI     criteria for the obstructive sleep apnea syndrome.  The patient did     have moderate snoring noted.  Treatment for her snoring can include     a trial of weight loss if applicable, positional therapy, and     consideration could be given to dental appliance and upper     airway surgery 2. Occasional PVC noted, but no clinically significant arrhythmias     were seen.     Barbaraann Share, MD,FCCP Diplomate, American Board of Sleep Medicine    [facility]  D:  11/14/2012 13:16:42  T:  11/14/2012  15:40:33  Job:  [id number]

## 2012-11-15 ENCOUNTER — Telehealth: Payer: Self-pay | Admitting: *Deleted

## 2012-11-15 NOTE — Telephone Encounter (Signed)
pt notified about UA results with verbal understanding

## 2012-11-15 NOTE — Telephone Encounter (Signed)
Message copied by Tarri Fuller on Mon Nov 15, 2012 12:30 PM ------      Message from: [place], [place] T      Created: Sat Nov 13, 2012  9:54 PM       Normal result      Please notify patient      Tereso Newcomer, Cordelia Poche        11/13/2012 9:54 PM ------

## 2012-11-16 ENCOUNTER — Encounter: Payer: Medicare Other | Attending: Physical Medicine & Rehabilitation

## 2012-11-16 ENCOUNTER — Ambulatory Visit (HOSPITAL_BASED_OUTPATIENT_CLINIC_OR_DEPARTMENT_OTHER): Payer: Medicare Other | Admitting: Physical Medicine & Rehabilitation

## 2012-11-16 ENCOUNTER — Encounter: Payer: Self-pay | Admitting: Physical Medicine & Rehabilitation

## 2012-11-16 VITALS — BP 150/94 | HR 66 | Resp 14 | Ht 62.0 in | Wt 135.0 lb

## 2012-11-16 DIAGNOSIS — M961 Postlaminectomy syndrome, not elsewhere classified: Secondary | ICD-10-CM | POA: Diagnosis not present

## 2012-11-16 DIAGNOSIS — M47817 Spondylosis without myelopathy or radiculopathy, lumbosacral region: Secondary | ICD-10-CM

## 2012-11-16 DIAGNOSIS — G8929 Other chronic pain: Secondary | ICD-10-CM | POA: Diagnosis not present

## 2012-11-16 DIAGNOSIS — M545 Low back pain, unspecified: Secondary | ICD-10-CM | POA: Diagnosis not present

## 2012-11-16 DIAGNOSIS — M25569 Pain in unspecified knee: Secondary | ICD-10-CM | POA: Diagnosis not present

## 2012-11-16 MED ORDER — TIZANIDINE HCL 4 MG PO TABS: 4.0000 mg | ORAL_TABLET | Freq: Three times a day (TID) | ORAL | Status: DC

## 2012-11-16 NOTE — Patient Instructions (Addendum)

## 2012-11-16 NOTE — Progress Notes (Signed)
  PROCEDURE RECORD [facility]   Name: Selinda Korzeniewski DOB:12-Nov-1955 MRN: [id number]  Date:11/16/2012  Physician: Claudette Laws, MD    Nurse/CMA: Vinie Charity RN  Allergies:  Allergies  Allergen Reactions  . Codeine Other (See Comments)    "I will have a heart attack."  . Morphine And Related Other (See Comments)    "It will cause me to have a heart attack."    Consent Signed: yes  Is patient diabetic? no  CBG today?   Pregnant: no LMP: No LMP recorded. Patient is postmenopausal. (age 56-55)  Anticoagulants: no Anti-inflammatory: yes (naproxen bid) Antibiotics: no  Procedure: right Medial branch blocks Position: Prone Start Time: 2:49 End Time: 2:54 Fluoro Time: 18 seconds  RN/CMA Designer, multimedia    Time 13:55 2:58    BP 150/94 155/73    Pulse 66 64    Respirations 14 14    O2 Sat 99 100    S/S 6 6    Pain Level 8/10 8/10     D/C home with Lujwana, patient A & O X 3, D/C instructions reviewed, and sits independently.

## 2012-11-16 NOTE — Progress Notes (Signed)
Right  L1, L2, L3 medial branch blocks under fluoroscopic guidance  Indication: Right Lumbar pain which is not relieved by medication management or other conservative care and interfering with self-care and mobility.  Informed consent was obtained after describing risks and benefits of the procedure with the patient, this includes bleeding, bruising, infection, paralysis and medication side effects. The patient wishes to proceed and has given written consent. The patient was placed in a prone position. The lumbar area was marked and prepped with Betadine. One ML of 1% lidocaine was injected into each of 3 areas into the skin and subcutaneous tissue. Then a 22-gauge 3.5 spinal needle was inserted targeting the junction of the Right L3 superior articular process /transverse process junction. Needle was advanced under fluoroscopic guidance. Bone contact was made. Omnipaque 180 was injected x0.5 mL demonstrating no intravascular uptake. Then a solution containing one ML of 4 mg per mL dexamethasone and 3 mL of 2% MPF lidocaine was injected x0.5 mL. Then the Right L2 superior articular process in transverse process junction was targeted. Bone contact was made. Omnipaque 180 was injected x0.5 mL demonstrating no intravascular uptake. Then a solution containing one ML of 4 mg per mL dexamethasone and 3 mL of 2% MPF lidocaine was injected x0.5 mL. Then the Right L1 superior articular process in transverse process junction was targeted. Bone contact was made. Omnipaque 180 was injected x0.5 mL demonstrating no intravascular uptake. Then a solution containing one ML of 4 mg per mL dexamethasone and 3 mL of 2% MPF lidocaine was injected x0.5 mL. Patient tolerated procedure well. Post procedure instructions were given. Please refer to post procedure form.

## 2012-11-17 ENCOUNTER — Other Ambulatory Visit: Payer: Self-pay | Admitting: Nurse Practitioner

## 2012-11-18 ENCOUNTER — Other Ambulatory Visit: Payer: Self-pay | Admitting: Nurse Practitioner

## 2012-11-18 ENCOUNTER — Ambulatory Visit (HOSPITAL_COMMUNITY)
Admission: RE | Admit: 2012-11-18 | Discharge: 2012-11-18 | Disposition: A | Discharge status: Home / Self Care | Payer: Medicare Other | Source: Ambulatory Visit | Attending: Physical Medicine & Rehabilitation | Admitting: Physical Medicine & Rehabilitation

## 2012-11-18 ENCOUNTER — Other Ambulatory Visit (INDEPENDENT_AMBULATORY_CARE_PROVIDER_SITE_OTHER): Payer: Medicare Other

## 2012-11-18 DIAGNOSIS — E876 Hypokalemia: Secondary | ICD-10-CM

## 2012-11-18 DIAGNOSIS — M25569 Pain in unspecified knee: Secondary | ICD-10-CM | POA: Insufficient documentation

## 2012-11-18 DIAGNOSIS — G8929 Other chronic pain: Secondary | ICD-10-CM

## 2012-11-18 LAB — BASIC METABOLIC PANEL
CO2: 30 mEq/L (ref 19–32)
Calcium: 8.9 mg/dL (ref 8.4–10.5)
GFR: 67.1 mL/min (ref >60.00)
Potassium: 4.4 mEq/L (ref 3.5–5.1)
Sodium: 140 mEq/L (ref 135–145)

## 2012-11-18 MED ORDER — OXYCODONE-ACETAMINOPHEN 5-325 MG PO TABS: ORAL_TABLET | ORAL | Status: DC

## 2012-11-18 NOTE — Telephone Encounter (Signed)
Last seen 11/04/12  MMM   If approved print and route to nurse

## 2012-11-18 NOTE — Telephone Encounter (Signed)
PT NOTIFIED RX AT FRONT TO PICKUP

## 2012-11-18 NOTE — Telephone Encounter (Signed)
rx ready for pickup 

## 2012-11-19 ENCOUNTER — Telehealth: Payer: Self-pay

## 2012-11-19 ENCOUNTER — Telehealth: Payer: Self-pay | Admitting: *Deleted

## 2012-11-19 ENCOUNTER — Telehealth: Payer: Self-pay | Admitting: Nurse Practitioner

## 2012-11-19 NOTE — Telephone Encounter (Signed)
Left voicemail for patient to return a call to clinic to give advise.

## 2012-11-19 NOTE — Telephone Encounter (Signed)
pt has been notified about lab results with verbal understanding. I asked if pt which NTG was she using , spray or tablet, she said spray, so I corrected her med list.

## 2012-11-19 NOTE — Telephone Encounter (Signed)
Probably muscle spasm. Try heat to the area 30 minutes 4 times per day

## 2012-11-19 NOTE — Telephone Encounter (Signed)
Patient called complaining of swollen injection site.  She says it is sore and painful.  She has used ice but its not helping.  Please advise.

## 2012-11-19 NOTE — Telephone Encounter (Signed)
lmptcb go over lab results, K+ and creatinine ok, glucose 58, recommend to f/u w/PCP for glucose

## 2012-11-19 NOTE — Telephone Encounter (Signed)
Low bs- appt made

## 2012-11-19 NOTE — Telephone Encounter (Signed)
Message copied by Tarri Fuller on Fri Nov 19, 2012 11:52 AM ------      Message from: [place], [place] T      Created: Thu Nov 18, 2012  5:25 PM       Potassium and kidney function ok      Blood sugar low       Continue with current treatment plan.      Tereso Newcomer, PA-C        11/18/2012 5:25 PM ------

## 2012-11-22 ENCOUNTER — Telehealth: Payer: Self-pay

## 2012-11-22 ENCOUNTER — Ambulatory Visit (INDEPENDENT_AMBULATORY_CARE_PROVIDER_SITE_OTHER): Payer: Medicare Other | Admitting: Nurse Practitioner

## 2012-11-22 ENCOUNTER — Encounter: Payer: Self-pay | Admitting: Nurse Practitioner

## 2012-11-22 ENCOUNTER — Telehealth: Payer: Self-pay | Admitting: *Deleted

## 2012-11-22 VITALS — BP 83/60 | HR 67 | Temp 98.0°F | Ht 62.0 in | Wt 134.0 lb

## 2012-11-22 DIAGNOSIS — E162 Hypoglycemia, unspecified: Secondary | ICD-10-CM | POA: Diagnosis not present

## 2012-11-22 DIAGNOSIS — L02219 Cutaneous abscess of trunk, unspecified: Secondary | ICD-10-CM

## 2012-11-22 DIAGNOSIS — IMO0002 Reserved for concepts with insufficient information to code with codable children: Secondary | ICD-10-CM

## 2012-11-22 MED ORDER — SULFAMETHOXAZOLE-TMP DS 800-160 MG PO TABS: 1.0000 | ORAL_TABLET | Freq: Two times a day (BID) | ORAL | Status: DC

## 2012-11-22 NOTE — Telephone Encounter (Signed)
Patient informed of xray results.  Informed patient her injection sight is Probably muscle spasm. Try heat to the area 30 minutes 4 times per day.  Patient says area is getting better and will continue to use heat and will call if any other problems.

## 2012-11-22 NOTE — Telephone Encounter (Signed)
Message copied by Judd Gaudier on Mon Nov 22, 2012  9:49 AM ------      Message from: Su Monks      Created: Mon Nov 22, 2012  9:45 AM       Please inform patient , X-ray of right knee were normal ------

## 2012-11-22 NOTE — Telephone Encounter (Signed)
Patient was returning a call to the clinic regarding pain(soreness and a lump) at injection site following the procedure done on 11/16/12.

## 2012-11-22 NOTE — Progress Notes (Signed)
Pt aware.

## 2012-11-22 NOTE — Telephone Encounter (Signed)
Left message for patient to call office regarding xray results. 

## 2012-11-22 NOTE — Progress Notes (Signed)
  Subjective:    Patient ID: [name], female    DOB: [birth year], 56 y.o.   MRN: [id number]  HPI Patient went to cardiologist last week and they wanted her to be seen because her blood sugar was low. Wanted her to be seen for HgbA1c. Patient has never been a diabetic and has never checked her blood sugars at home.  *PAtient got a bellybutton ring and it has gotten infected- needs antibiotic  Review of Systems  All other systems reviewed and are negative.       Objective:   Physical Exam  Constitutional: She appears well-developed and well-nourished.  Cardiovascular: Normal rate and normal heart sounds.   Pulmonary/Chest: Effort normal and breath sounds normal.  Skin:  Umbilicus piercing is erythematous and moist appearing- tender to touch- no pus visible   BP 83/60  Pulse 67  Temp(Src) 98 F (36.7 C) (Oral)  Ht 5\' 2"  (1.575 m)  Wt 134 lb (60.782 kg)  BMI 24.5 kg/m2  Results for orders placed in visit on 11/22/12  POCT GLYCOSYLATED HEMOGLOBIN (HGB A1C)      Result Value Range   Hemoglobin A1C 5.4           Assessment & Plan:  1. Hypoglycemia Eat 3 meals a day and snacks in between- If feel weak or dizzy then eat. - POCT glycosylated hemoglobin (Hb A1C)  2. Cellulitis of umbilicus -Bactrim DS 1 po Bid #20 0 refills Clean with antibacterial soap BID  [name] Daphine Deutscher, FNP

## 2012-11-22 NOTE — Telephone Encounter (Signed)
Message copied by Doreene Eland on Mon Nov 22, 2012  4:00 PM ------      Message from: Su Monks      Created: Mon Nov 22, 2012  9:45 AM       Please inform patient , X-ray of right knee were normal ------

## 2012-11-22 NOTE — Telephone Encounter (Signed)
Notified of her xray results.

## 2012-11-25 DIAGNOSIS — F333 Major depressive disorder, recurrent, severe with psychotic symptoms: Secondary | ICD-10-CM | POA: Diagnosis not present

## 2012-12-01 DIAGNOSIS — I252 Old myocardial infarction: Secondary | ICD-10-CM | POA: Diagnosis not present

## 2012-12-01 DIAGNOSIS — I959 Hypotension, unspecified: Secondary | ICD-10-CM | POA: Diagnosis present

## 2012-12-01 DIAGNOSIS — I10 Essential (primary) hypertension: Secondary | ICD-10-CM | POA: Diagnosis present

## 2012-12-01 DIAGNOSIS — A6000 Herpesviral infection of urogenital system, unspecified: Secondary | ICD-10-CM | POA: Diagnosis present

## 2012-12-01 DIAGNOSIS — X838XXA Intentional self-harm by other specified means, initial encounter: Secondary | ICD-10-CM | POA: Diagnosis not present

## 2012-12-01 DIAGNOSIS — Z79899 Other long term (current) drug therapy: Secondary | ICD-10-CM | POA: Diagnosis not present

## 2012-12-01 DIAGNOSIS — T424X4A Poisoning by benzodiazepines, undetermined, initial encounter: Secondary | ICD-10-CM | POA: Diagnosis not present

## 2012-12-01 DIAGNOSIS — Z885 Allergy status to narcotic agent status: Secondary | ICD-10-CM | POA: Diagnosis not present

## 2012-12-01 DIAGNOSIS — F3289 Other specified depressive episodes: Secondary | ICD-10-CM | POA: Diagnosis present

## 2012-12-01 DIAGNOSIS — E785 Hyperlipidemia, unspecified: Secondary | ICD-10-CM | POA: Diagnosis present

## 2012-12-01 DIAGNOSIS — T43502A Poisoning by unspecified antipsychotics and neuroleptics, intentional self-harm, initial encounter: Secondary | ICD-10-CM | POA: Diagnosis not present

## 2012-12-01 DIAGNOSIS — T50901A Poisoning by unspecified drugs, medicaments and biological substances, accidental (unintentional), initial encounter: Secondary | ICD-10-CM | POA: Diagnosis not present

## 2012-12-01 DIAGNOSIS — Z886 Allergy status to analgesic agent status: Secondary | ICD-10-CM | POA: Diagnosis not present

## 2012-12-01 DIAGNOSIS — Z9189 Other specified personal risk factors, not elsewhere classified: Secondary | ICD-10-CM | POA: Diagnosis not present

## 2012-12-01 DIAGNOSIS — F4310 Post-traumatic stress disorder, unspecified: Secondary | ICD-10-CM | POA: Diagnosis present

## 2012-12-01 DIAGNOSIS — G7109 Other specified muscular dystrophies: Secondary | ICD-10-CM | POA: Diagnosis present

## 2012-12-01 DIAGNOSIS — F4481 Dissociative identity disorder: Secondary | ICD-10-CM | POA: Diagnosis present

## 2012-12-01 DIAGNOSIS — R0989 Other specified symptoms and signs involving the circulatory and respiratory systems: Secondary | ICD-10-CM | POA: Diagnosis not present

## 2012-12-01 DIAGNOSIS — J45909 Unspecified asthma, uncomplicated: Secondary | ICD-10-CM | POA: Diagnosis present

## 2012-12-01 DIAGNOSIS — F329 Major depressive disorder, single episode, unspecified: Secondary | ICD-10-CM | POA: Diagnosis present

## 2012-12-01 DIAGNOSIS — G9389 Other specified disorders of brain: Secondary | ICD-10-CM | POA: Diagnosis present

## 2012-12-06 DIAGNOSIS — F4310 Post-traumatic stress disorder, unspecified: Secondary | ICD-10-CM | POA: Diagnosis not present

## 2012-12-07 ENCOUNTER — Encounter: Payer: Medicare Other | Attending: Physical Medicine & Rehabilitation

## 2012-12-07 ENCOUNTER — Encounter: Payer: Self-pay | Admitting: Physical Medicine & Rehabilitation

## 2012-12-07 ENCOUNTER — Ambulatory Visit (HOSPITAL_BASED_OUTPATIENT_CLINIC_OR_DEPARTMENT_OTHER): Payer: Medicare Other | Admitting: Physical Medicine & Rehabilitation

## 2012-12-07 VITALS — BP 123/66 | HR 81 | Resp 14 | Ht 62.0 in | Wt 142.2 lb

## 2012-12-07 DIAGNOSIS — E7800 Pure hypercholesterolemia, unspecified: Secondary | ICD-10-CM | POA: Diagnosis present

## 2012-12-07 DIAGNOSIS — D696 Thrombocytopenia, unspecified: Secondary | ICD-10-CM | POA: Diagnosis not present

## 2012-12-07 DIAGNOSIS — M545 Low back pain, unspecified: Secondary | ICD-10-CM | POA: Insufficient documentation

## 2012-12-07 DIAGNOSIS — E785 Hyperlipidemia, unspecified: Secondary | ICD-10-CM | POA: Diagnosis present

## 2012-12-07 DIAGNOSIS — IMO0001 Reserved for inherently not codable concepts without codable children: Secondary | ICD-10-CM | POA: Diagnosis not present

## 2012-12-07 DIAGNOSIS — J449 Chronic obstructive pulmonary disease, unspecified: Secondary | ICD-10-CM | POA: Diagnosis present

## 2012-12-07 DIAGNOSIS — R197 Diarrhea, unspecified: Secondary | ICD-10-CM | POA: Diagnosis not present

## 2012-12-07 DIAGNOSIS — F4310 Post-traumatic stress disorder, unspecified: Secondary | ICD-10-CM | POA: Diagnosis present

## 2012-12-07 DIAGNOSIS — A09 Infectious gastroenteritis and colitis, unspecified: Secondary | ICD-10-CM | POA: Diagnosis present

## 2012-12-07 DIAGNOSIS — I10 Essential (primary) hypertension: Secondary | ICD-10-CM | POA: Diagnosis present

## 2012-12-07 DIAGNOSIS — F4481 Dissociative identity disorder: Secondary | ICD-10-CM | POA: Diagnosis present

## 2012-12-07 DIAGNOSIS — Z79899 Other long term (current) drug therapy: Secondary | ICD-10-CM | POA: Diagnosis not present

## 2012-12-07 DIAGNOSIS — F319 Bipolar disorder, unspecified: Secondary | ICD-10-CM | POA: Diagnosis present

## 2012-12-07 DIAGNOSIS — G43709 Chronic migraine without aura, not intractable, without status migrainosus: Secondary | ICD-10-CM | POA: Diagnosis present

## 2012-12-07 DIAGNOSIS — I252 Old myocardial infarction: Secondary | ICD-10-CM | POA: Diagnosis not present

## 2012-12-07 DIAGNOSIS — J9819 Other pulmonary collapse: Secondary | ICD-10-CM | POA: Diagnosis not present

## 2012-12-07 DIAGNOSIS — R109 Unspecified abdominal pain: Secondary | ICD-10-CM | POA: Diagnosis not present

## 2012-12-07 DIAGNOSIS — Z886 Allergy status to analgesic agent status: Secondary | ICD-10-CM | POA: Diagnosis not present

## 2012-12-07 DIAGNOSIS — K219 Gastro-esophageal reflux disease without esophagitis: Secondary | ICD-10-CM | POA: Diagnosis present

## 2012-12-07 DIAGNOSIS — K5289 Other specified noninfective gastroenteritis and colitis: Secondary | ICD-10-CM | POA: Diagnosis not present

## 2012-12-07 DIAGNOSIS — Z885 Allergy status to narcotic agent status: Secondary | ICD-10-CM | POA: Diagnosis not present

## 2012-12-07 NOTE — Patient Instructions (Addendum)

## 2012-12-07 NOTE — Progress Notes (Signed)
Trigger Point Injection  Indication: Cervicothoracic Myofascial pain not relieved by medication management and other conservative care.  Informed consent was obtained after describing risk and benefits of the procedure with the patient, this includes bleeding, bruising, infection and medication side effects.  The patient wishes to proceed and has given written consent.  The patient was placed in a prone position.  R suboccipital , R C5 paraspinal, R trapezius, R levator scapula, bilateral T8 paraspinal were marked and prepped with Betadine.  It was entered with a 25-gauge 1-1/2 inch needle and .5 mL of 1% lidocaine was injected into each of 6 trigger points, after negative draw back for blood.  The patient tolerated the procedure well.  Post procedure instructions were given.

## 2012-12-13 ENCOUNTER — Telehealth: Payer: Self-pay

## 2012-12-13 NOTE — Telephone Encounter (Signed)
Patient is requesting a letter saying that she was seen in the clinic on 12/07/12 @ 1:30pm be sent to [facility] at [phone]. Patient stated she forgot to get her gas voucher signed while in the office and needs the letter for verification.

## 2012-12-13 NOTE — Telephone Encounter (Signed)
Letter was written and faxed to [phone].

## 2012-12-15 DIAGNOSIS — Z9119 Patient's noncompliance with other medical treatment and regimen: Secondary | ICD-10-CM | POA: Diagnosis not present

## 2012-12-15 DIAGNOSIS — S298XXA Other specified injuries of thorax, initial encounter: Secondary | ICD-10-CM | POA: Diagnosis not present

## 2012-12-15 DIAGNOSIS — W1809XA Striking against other object with subsequent fall, initial encounter: Secondary | ICD-10-CM | POA: Diagnosis not present

## 2012-12-15 DIAGNOSIS — R269 Unspecified abnormalities of gait and mobility: Secondary | ICD-10-CM | POA: Diagnosis not present

## 2012-12-15 DIAGNOSIS — J45909 Unspecified asthma, uncomplicated: Secondary | ICD-10-CM | POA: Diagnosis not present

## 2012-12-15 DIAGNOSIS — E7800 Pure hypercholesterolemia, unspecified: Secondary | ICD-10-CM | POA: Diagnosis not present

## 2012-12-15 DIAGNOSIS — Z79899 Other long term (current) drug therapy: Secondary | ICD-10-CM | POA: Diagnosis not present

## 2012-12-15 DIAGNOSIS — F319 Bipolar disorder, unspecified: Secondary | ICD-10-CM | POA: Diagnosis not present

## 2012-12-15 DIAGNOSIS — Z9181 History of falling: Secondary | ICD-10-CM | POA: Diagnosis not present

## 2012-12-15 DIAGNOSIS — Z91199 Patient's noncompliance with other medical treatment and regimen due to unspecified reason: Secondary | ICD-10-CM | POA: Diagnosis not present

## 2012-12-15 DIAGNOSIS — K219 Gastro-esophageal reflux disease without esophagitis: Secondary | ICD-10-CM | POA: Diagnosis not present

## 2012-12-15 DIAGNOSIS — I10 Essential (primary) hypertension: Secondary | ICD-10-CM | POA: Diagnosis not present

## 2012-12-15 DIAGNOSIS — I252 Old myocardial infarction: Secondary | ICD-10-CM | POA: Diagnosis not present

## 2012-12-15 DIAGNOSIS — I959 Hypotension, unspecified: Secondary | ICD-10-CM | POA: Diagnosis not present

## 2012-12-15 DIAGNOSIS — S0100XA Unspecified open wound of scalp, initial encounter: Secondary | ICD-10-CM | POA: Diagnosis not present

## 2012-12-15 DIAGNOSIS — R55 Syncope and collapse: Secondary | ICD-10-CM | POA: Diagnosis not present

## 2012-12-15 DIAGNOSIS — R4182 Altered mental status, unspecified: Secondary | ICD-10-CM | POA: Diagnosis not present

## 2012-12-15 DIAGNOSIS — S0190XA Unspecified open wound of unspecified part of head, initial encounter: Secondary | ICD-10-CM | POA: Diagnosis not present

## 2012-12-15 DIAGNOSIS — Z950 Presence of cardiac pacemaker: Secondary | ICD-10-CM | POA: Diagnosis not present

## 2012-12-15 DIAGNOSIS — I519 Heart disease, unspecified: Secondary | ICD-10-CM | POA: Diagnosis not present

## 2012-12-16 ENCOUNTER — Encounter: Payer: Self-pay | Admitting: Nurse Practitioner

## 2012-12-16 ENCOUNTER — Ambulatory Visit (INDEPENDENT_AMBULATORY_CARE_PROVIDER_SITE_OTHER): Payer: Medicare Other | Admitting: Nurse Practitioner

## 2012-12-16 VITALS — BP 127/72 | HR 77 | Temp 99.0°F | Wt 140.5 lb

## 2012-12-16 DIAGNOSIS — R3915 Urgency of urination: Secondary | ICD-10-CM

## 2012-12-16 DIAGNOSIS — R7989 Other specified abnormal findings of blood chemistry: Secondary | ICD-10-CM

## 2012-12-16 DIAGNOSIS — Z7251 High risk heterosexual behavior: Secondary | ICD-10-CM

## 2012-12-16 DIAGNOSIS — D691 Qualitative platelet defects: Secondary | ICD-10-CM | POA: Diagnosis not present

## 2012-12-16 DIAGNOSIS — Z09 Encounter for follow-up examination after completed treatment for conditions other than malignant neoplasm: Secondary | ICD-10-CM

## 2012-12-16 DIAGNOSIS — R55 Syncope and collapse: Secondary | ICD-10-CM | POA: Diagnosis not present

## 2012-12-16 LAB — POCT CBC
Hemoglobin: 10.6 g/dL — AB (ref 12.2–16.2)
MCH, POC: 29.3 pg (ref 27–31.2)
MCHC: 33.4 g/dL (ref 31.8–35.4)
MPV: 7.4 fL (ref 0–99.8)
POC LYMPH PERCENT: 33.2 %L (ref 10–50)
RDW, POC: 13.5 %
WBC: 5.6 10*3/uL (ref 4.6–10.2)

## 2012-12-16 LAB — POCT URINALYSIS DIPSTICK
Glucose, UA: NEGATIVE
Leukocytes, UA: NEGATIVE
Nitrite, UA: NEGATIVE
Urobilinogen, UA: NEGATIVE

## 2012-12-16 LAB — POCT UA - MICROSCOPIC ONLY: Casts, Ur, LPF, POC: NEGATIVE

## 2012-12-16 MED ORDER — CIPROFLOXACIN HCL 500 MG PO TABS: 500.0000 mg | ORAL_TABLET | Freq: Two times a day (BID) | ORAL | Status: DC

## 2012-12-16 NOTE — Patient Instructions (Signed)
Syncope  Syncope is a fainting spell. This means the person loses consciousness and drops to the ground. The person is generally unconscious for less than 5 minutes. The person may have some muscle twitches for up to 15 seconds before waking up and returning to normal. Syncope occurs more often in elderly people, but it can happen to anyone. While most causes of syncope are not dangerous, syncope can be a sign of a serious medical problem. It is important to seek medical care.   CAUSES   Syncope is caused by a sudden decrease in blood flow to the brain. The specific cause is often not determined. Factors that can trigger syncope include:   Taking medicines that lower blood pressure.   Sudden changes in posture, such as standing up suddenly.   Taking more medicine than prescribed.   Standing in one place for too long.   Seizure disorders.   Dehydration and excessive exposure to heat.   Low blood sugar (hypoglycemia).   Straining to have a bowel movement.   Heart disease, irregular heartbeat, or other circulatory problems.   Fear, emotional distress, seeing blood, or severe pain.  SYMPTOMS   Right before fainting, you may:   Feel dizzy or lightheaded.   Feel nauseous.   See all white or all black in your field of vision.   Have cold, clammy skin.  DIAGNOSIS   Your caregiver will ask about your symptoms, perform a physical exam, and perform electrocardiography (ECG) to record the electrical activity of your heart. Your caregiver may also perform other heart or blood tests to determine the cause of your syncope.  TREATMENT   In most cases, no treatment is needed. Depending on the cause of your syncope, your caregiver may recommend changing or stopping some of your medicines.  HOME CARE INSTRUCTIONS   Have someone stay with you until you feel stable.   Do not drive, operate machinery, or play sports until your caregiver says it is okay.   Keep all follow-up appointments as directed by your  caregiver.   Lie down right away if you start feeling like you might faint. Breathe deeply and steadily. Wait until all the symptoms have passed.   Drink enough fluids to keep your urine clear or pale yellow.   If you are taking blood pressure or heart medicine, get up slowly, taking several minutes to sit and then stand. This can reduce dizziness.  SEEK IMMEDIATE MEDICAL CARE IF:    You have a severe headache.   You have unusual pain in the chest, abdomen, or back.   You are bleeding from the mouth or rectum, or you have black or tarry stool.   You have an irregular or very fast heartbeat.   You have pain with breathing.   You have repeated fainting or seizure-like jerking during an episode.   You faint when sitting or lying down.   You have confusion.   You have difficulty walking.   You have severe weakness.   You have vision problems.  If you fainted, call your local emergency services (911 in [place] Do not drive yourself to the hospital.   MAKE SURE YOU:   Understand these instructions.   Will watch your condition.   Will get help right away if you are not doing well or get worse.  Document Released: 03/17/2005 Document Revised: 09/16/2011 Document Reviewed: 05/16/2011  [employer] Patient Information 2014 [employer].
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
## 2012-12-17 ENCOUNTER — Telehealth: Payer: Self-pay | Admitting: Internal Medicine

## 2012-12-17 ENCOUNTER — Telehealth: Payer: Self-pay | Admitting: Nurse Practitioner

## 2012-12-17 ENCOUNTER — Encounter: Payer: Self-pay | Admitting: Internal Medicine

## 2012-12-17 LAB — CMP14+EGFR
ALT: 11 IU/L (ref 0–32)
AST: 16 IU/L (ref 0–40)
Albumin/Globulin Ratio: 2 (ref 1.1–2.5)
GFR calc Af Amer: 86 mL/min/{1.73_m2} (ref >59)
GFR calc non Af Amer: 75 mL/min/{1.73_m2} (ref >59)
Potassium: 4.6 mmol/L (ref 3.5–5.2)
Sodium: 143 mmol/L (ref 134–144)
Total Bilirubin: 0.1 mg/dL (ref 0.0–1.2)

## 2012-12-17 LAB — STD PANEL
Hepatitis B Surface Ag: NEGATIVE
RPR: NONREACTIVE

## 2012-12-17 MED ORDER — FUROSEMIDE 20 MG PO TABS: 20.0000 mg | ORAL_TABLET | Freq: Two times a day (BID) | ORAL | Status: DC

## 2012-12-17 MED ORDER — NITROFURANTOIN MONOHYD MACRO 100 MG PO CAPS: 100.0000 mg | ORAL_CAPSULE | Freq: Two times a day (BID) | ORAL | Status: DC

## 2012-12-17 NOTE — Telephone Encounter (Signed)
New Problem  Pt needs verification that appt date was changed by office and not per pt sent to. Or they will not honor the appt change and will cancel transportation.   [employer] Attn: [employer] Fax # [phone]

## 2012-12-17 NOTE — Telephone Encounter (Signed)
rx sent to pharmacy

## 2012-12-17 NOTE — Telephone Encounter (Signed)
Sent to scheduling to f/u on.

## 2012-12-21 ENCOUNTER — Other Ambulatory Visit: Payer: Self-pay | Admitting: Nurse Practitioner

## 2012-12-21 ENCOUNTER — Encounter: Payer: Self-pay | Admitting: Internal Medicine

## 2012-12-21 ENCOUNTER — Ambulatory Visit (INDEPENDENT_AMBULATORY_CARE_PROVIDER_SITE_OTHER): Payer: Medicare Other | Admitting: Internal Medicine

## 2012-12-21 VITALS — BP 117/73 | HR 75 | Ht 62.0 in | Wt 140.0 lb

## 2012-12-21 DIAGNOSIS — R55 Syncope and collapse: Secondary | ICD-10-CM | POA: Diagnosis not present

## 2012-12-21 DIAGNOSIS — I10 Essential (primary) hypertension: Secondary | ICD-10-CM | POA: Diagnosis not present

## 2012-12-21 MED ORDER — FLUDROCORTISONE ACETATE 0.1 MG PO TABS: 0.0500 mg | ORAL_TABLET | Freq: Two times a day (BID) | ORAL | Status: DC

## 2012-12-21 NOTE — Patient Instructions (Signed)
Your physician recommends that you schedule a follow-up appointment in: 4 months with Dr Ladona Ridgel  Your physician has recommended you make the following change in your medication:  1) Start Florinef 0.1mg  1/2 tablet twice daily

## 2012-12-21 NOTE — Progress Notes (Signed)
HPI [name] returns today for followup. She is a 57 year old woman with a history of recurrent syncope, hypertension, and palpitations. Recent evaluation by Mr. Tereso Newcomer was carried out and she underwent lab work and a 24-hour ambulatory blood pressure monitor. Her blood work was essentially normal but her cardiac monitor did show excursions of her blood pressure, predominantly low but at times high. Her blood pressures average 120 to 130 but at times dropped down into the 70s as well as up into the 170s. While she wore her monitor, she had no syncope. She has had intermittent syncope in the past, and a implantable loop recorder demonstrated no clear-cut cause of her syncope. She denies shortness of breath but does have noncardiac chest pain at times. Allergies  Allergen Reactions  . Codeine Other (See Comments)    "I will have a heart attack."  . Morphine And Related Other (See Comments)    "It will cause me to have a heart attack."     Current Outpatient Prescriptions  Medication Sig Dispense Refill  . acyclovir ointment (ZOVIRAX) 5 % APPLY TO AFFECTED AREA AS NEEDED.  30 g  1  . albuterol (PROAIR HFA) 108 (90 BASE) MCG/ACT inhaler Inhale 2 puffs into the lungs every 6 (six) hours as needed.       [name] atenolol (TENORMIN) 25 MG tablet Take 1 tablet (25 mg total) by mouth daily.      . butalbital-acetaminophen-caffeine (FIORICET, ESGIC) 50-325-40 MG per tablet TAKE 1 TABLET BY MOUTH EVERY 6 HOURS AS NEEDED FOR HEADACHE.  40 tablet  0  . CYMBALTA 60 MG capsule Take 1 capsule by mouth at bedtime.      . gabapentin (NEURONTIN) 100 MG capsule       . naproxen (NAPROSYN) 500 MG tablet TAKE 1 TABLET BY MOUTH TWICE DAILY WITH MEALS.  60 tablet  2  . nitrofurantoin, macrocrystal-monohydrate, (MACROBID) 100 MG capsule Take 1 capsule (100 mg total) by mouth 2 (two) times daily.  14 capsule  0  . nitroGLYCERIN (NITROLINGUAL) 0.4 MG/SPRAY spray USE (1) SPRAY UNDER TONGUE AS NEEDED FOR CHEST PAIN.  12  g  0  . omeprazole (PRILOSEC) 40 MG capsule TAKE 1 CAPSULE DAILY.  30 capsule  1  . QUEtiapine (SEROQUEL) 50 MG tablet Take 1 tablet by mouth at bedtime.      . rosuvastatin (CRESTOR) 20 MG tablet Take 1 tablet (20 mg total) by mouth daily. (HOLD UNTIL FOLLOW UP WITH PCP)      . tiZANidine (ZANAFLEX) 4 MG tablet Take 1 tablet (4 mg total) by mouth 3 (three) times daily.  90 tablet  1  . traZODone (DESYREL) 100 MG tablet Take 200 mg by mouth daily.        . valACYclovir (VALTREX) 500 MG tablet Take 500 mg by mouth 2 (two) times daily.        . diazepam (VALIUM) 10 MG tablet       . fludrocortisone (FLORINEF) 0.1 MG tablet Take 0.5 tablets (0.05 mg total) by mouth 2 (two) times daily.  30 tablet  6  . furosemide (LASIX) 20 MG tablet Take 1 tablet (20 mg total) by mouth 2 (two) times daily.  60 tablet  1   No current facility-administered medications for this visit.     Past Medical History  Diagnosis Date  . Anxiety   . Herpes simplex infection   . Osteoporosis   . Sleep apnea   . Hypertension   . Dyslipidemia   .  Depression   . Asthma   . Fibromyalgia   . GERD (gastroesophageal reflux disease)   . Head injury, unspecified   . Supraventricular tachycardia   . Syncope and collapse     s/p ILR; no arhythmogenic cause identified  . Coronary artery disease     reported hx of "MI";  Echo 2009 with normal LVF;  Myoview 05/2011: no ischemia  . Spondylolisthesis   . Spinal stenosis of lumbar region   . Hyperlipidemia   . Bipolar 1 disorder   . Insomnia   . Arthritis     RHEUMATOID    ROS:   All systems reviewed and negative except as noted in the HPI.   Past Surgical History  Procedure Laterality Date  . Insertion of implatable loop recorder  08/11/2007    [name]  . Head up tilt table testing  06/15/2007    [name]  . Back surgery    . Doppler echocardiography  2009     Family History  Problem Relation Age of Onset  . Heart attack Father   . Cancer Father    . Mental illness Father   . Heart attack Brother     stents  . Cancer Mother   . Mental illness Mother      History   Social History  . Marital Status: Single    Spouse Name: N/A    Number of Children: N/A  . Years of Education: N/A   Occupational History  . Not on file.   Social History Main Topics  . Smoking status: Never Smoker   . Smokeless tobacco: Never Used  . Alcohol Use: No  . Drug Use: No  . Sexual Activity: Not on file   Other Topics Concern  . Not on file   Social History Narrative  . No narrative on file     BP 117/73  Pulse 75  Ht 5\' 2"  (1.575 m)  Wt 140 lb (63.504 kg)  BMI 25.6 kg/m2  Physical Exam:  Well appearing  Middle-aged woman,NAD HEENT: Unremarkable Neck:  No JVD, no thyromegally Back:  No CVA tenderness Lungs:  Clear with no wheezes, rales, or rhonchi. HEART:  Regular rate rhythm, grade 1/6 systolic murmur, no rubs, no clicks Abd:  soft, positive bowel sounds, no organomegally, no rebound, no guarding Ext:  2 plus pulses, no edema, no cyanosis, no clubbing Skin:  No rashes no nodules Neuro:  CN II through XII intact, motor grossly intact   Assess/Plan:

## 2012-12-22 ENCOUNTER — Encounter: Payer: Self-pay | Admitting: Internal Medicine

## 2012-12-22 DIAGNOSIS — F4310 Post-traumatic stress disorder, unspecified: Secondary | ICD-10-CM | POA: Diagnosis not present

## 2012-12-22 NOTE — Assessment & Plan Note (Signed)
Her blood pressure is stable today. She will undergo watchful waiting.

## 2012-12-22 NOTE — Assessment & Plan Note (Signed)
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 We discussed the treatment options in detail. Despite her episodes of hypertension, I have recommended that we initiate low dose Florinef as she has documented hypotension. My goal would be to reduce her episodes of hypotension even if she then became relatively hypertensive. She is further instructed to avoid diuretic therapy. I will see her back in several months to see she is doing.

## 2012-12-27 ENCOUNTER — Other Ambulatory Visit: Payer: Self-pay | Admitting: Nurse Practitioner

## 2012-12-27 NOTE — Telephone Encounter (Signed)
Please advise 

## 2012-12-28 ENCOUNTER — Encounter: Payer: Self-pay | Admitting: Neurology

## 2012-12-28 ENCOUNTER — Ambulatory Visit (INDEPENDENT_AMBULATORY_CARE_PROVIDER_SITE_OTHER): Payer: Medicare Other | Admitting: Neurology

## 2012-12-28 ENCOUNTER — Ambulatory Visit: Payer: Medicare Other | Admitting: Internal Medicine

## 2012-12-28 VITALS — BP 124/78 | HR 73 | Ht 62.0 in | Wt 144.0 lb

## 2012-12-28 DIAGNOSIS — R55 Syncope and collapse: Secondary | ICD-10-CM | POA: Diagnosis not present

## 2012-12-28 DIAGNOSIS — R4182 Altered mental status, unspecified: Secondary | ICD-10-CM

## 2012-12-28 NOTE — Patient Instructions (Addendum)
Overall you are doing fairly well but I do want to suggest a few things today:   Remember to drink plenty of fluid, eat healthy meals and do not skip any meals. Try to eat protein with a every meal and eat a healthy snack such as fruit or nuts in between meals. Try to keep a regular sleep-wake schedule and try to exercise daily, particularly in the form of walking, 20-30 minutes a day, if you can.   Continue to take your florinef and follow up with cardiology. I suspect your symptoms are mainly due to drops in your blood pressure  As far as diagnostic testing:  I would like to order an EEG to check for seizure activity  Please also call us for any test results so we can go over those with you on the phone.  My clinical assistant and will answer any of your questions and relay your messages to me and also relay most of my messages to you.   Our phone number is [phone]. We also have an after hours call service for urgent matters and there is a physician on-call for urgent questions. For any emergencies you know to call 911 or go to the nearest emergency room

## 2012-12-28 NOTE — Progress Notes (Signed)
[facility]  Provider:  Dr Hosie Poisson Referring Provider: Bennie Pierini, * Primary Care Physician:  Rudi Heap, MD  CC:  Loss of consciousness   HPI:  [name] is a 57 y.o. female here as a referral from Dr. Daphine Deutscher for episodes of loss of consciousness  Having passing out episodes for years. Notes she has been having some jerks in her sleep and now during the daytime. Occurs when she blacks out, feels her whole body jerking, states there is nothing she can do about it. She reports it lasts for 5 to 10 minutes, she can remember the jerking but doesn't remember what people are saying around her. Recently evaluated by cardiology, had cardiac monitoring with noted fluctuations in BP from SBP 70s up to 170s. She reports having had a seizure while in her sleep study. She is unsure what the official report of sleep study showed. Denies any history of head trauma. No prior seizure history.   Sleep study from 10/2012 reviewed and results found to be unremarkable.   Cardiology note from 11/2012: Her blood work was essentially normal but her cardiac monitor did show excursions of her blood pressure, predominantly low but at times high. Her blood pressures average 120 to 130 but at times dropped down into the 70s as well as up into the 170s. While she wore her monitor, she had no syncope.   Review of Systems: Out of a complete 14 system review, the patient complains of only the following symptoms, and all other reviewed systems are negative. For weight gain fatigue anemia easy bruising easy bleeding chest pain palpitations swelling in feet short of breath cough wheezing snoring feeling hot feeling cold increased thirst blood in stool joint pain joint swelling cramps aching muscles seizure passing out tremor slurred speech restless legs depression anxiety  History   Social History  . Marital Status: Single    Spouse Name: N/A    Number of Children: N/A  . Years  of Education: N/A   Occupational History  . Not on file.   Social History Main Topics  . Smoking status: Never Smoker   . Smokeless tobacco: Never Used  . Alcohol Use: No  . Drug Use: No  . Sexual Activity: Not on file   Other Topics Concern  . Not on file   Social History Narrative   Patient lives at home with her brother.   Patient is on disability.    Patient has 8th grade education.    Patient has 6 children, 20 grand-chilren, and 3 great-grand children.     Family History  Problem Relation Age of Onset  . Heart attack Father   . Cancer Father   . Mental illness Father   . Heart attack Brother     stents  . Cancer Mother   . Mental illness Mother     Past Medical History  Diagnosis Date  . Anxiety   . Herpes simplex infection   . Osteoporosis   . Sleep apnea   . Hypertension   . Dyslipidemia   . Depression   . Asthma   . Fibromyalgia   . GERD (gastroesophageal reflux disease)   . Head injury, unspecified   . Supraventricular tachycardia   . Syncope and collapse     s/p ILR; no arhythmogenic cause identified  . Coronary artery disease     reported hx of "MI";  Echo 2009 with normal LVF;  Myoview 05/2011: no ischemia  . Spondylolisthesis   .  Spinal stenosis of lumbar region   . Hyperlipidemia   . Bipolar 1 disorder   . Insomnia   . Arthritis     RHEUMATOID    Past Surgical History  Procedure Laterality Date  . Insertion of implatable loop recorder  08/11/2007    [name]  . Head up tilt table testing  06/15/2007    [name]  . Back surgery    . Doppler echocardiography  2009    Current Outpatient Prescriptions  Medication Sig Dispense Refill  . acyclovir ointment (ZOVIRAX) 5 % APPLY TO AFFECTED AREA AS NEEDED.  30 g  1  . atenolol (TENORMIN) 25 MG tablet Take 1 tablet (25 mg total) by mouth daily.      . butalbital-acetaminophen-caffeine (FIORICET, ESGIC) 50-325-40 MG per tablet TAKE 1 TABLET BY MOUTH EVERY 6 HOURS AS NEEDED FOR  HEADACHE.  40 tablet  0  . CYMBALTA 60 MG capsule Take 1 capsule by mouth at bedtime.      . diazepam (VALIUM) 10 MG tablet       . fludrocortisone (FLORINEF) 0.1 MG tablet Take 0.5 tablets (0.05 mg total) by mouth 2 (two) times daily.  30 tablet  6  . furosemide (LASIX) 20 MG tablet Take 1 tablet (20 mg total) by mouth 2 (two) times daily.  60 tablet  1  . gabapentin (NEURONTIN) 100 MG capsule       . naproxen (NAPROSYN) 500 MG tablet TAKE 1 TABLET BY MOUTH TWICE DAILY WITH MEALS.  60 tablet  2  . nitrofurantoin, macrocrystal-monohydrate, (MACROBID) 100 MG capsule Take 1 capsule (100 mg total) by mouth 2 (two) times daily.  14 capsule  0  . nitroGLYCERIN (NITROLINGUAL) 0.4 MG/SPRAY spray USE (1) SPRAY UNDER TONGUE AS NEEDED FOR CHEST PAIN.  12 g  0  . omeprazole (PRILOSEC) 40 MG capsule TAKE 1 CAPSULE DAILY.  30 capsule  1  . QUEtiapine (SEROQUEL) 50 MG tablet Take 1 tablet by mouth at bedtime.      . rosuvastatin (CRESTOR) 20 MG tablet Take 1 tablet (20 mg total) by mouth daily. (HOLD UNTIL FOLLOW UP WITH PCP)      . rosuvastatin (CRESTOR) 20 MG tablet Take 0.5 tablets (10 mg total) by mouth daily.  30 tablet  3  . tiZANidine (ZANAFLEX) 4 MG tablet Take 1 tablet (4 mg total) by mouth 3 (three) times daily.  90 tablet  1  . traZODone (DESYREL) 100 MG tablet Take 200 mg by mouth daily.        . valACYclovir (VALTREX) 500 MG tablet Take 500 mg by mouth 2 (two) times daily.        . VENTOLIN HFA 108 (90 BASE) MCG/ACT inhaler INHALE 2 PUFFS EVERY 6 HOURS AS NEEDED.  18 g  2   No current facility-administered medications for this visit.    Allergies as of 12/28/2012 - Review Complete 12/22/2012  Allergen Reaction Noted  . Codeine Other (See Comments)   . Morphine and related Other (See Comments) 08/12/2010    Vitals: BP 124/78  Pulse 73  Ht 5\' 2"  (1.575 m)  Wt 144 lb (65.318 kg)  BMI 26.33 kg/m2 Last Weight:  Wt Readings from Last 1 Encounters:  12/28/12 144 lb (65.318 kg)   Last  Height:   Ht Readings from Last 1 Encounters:  12/28/12 5\' 2"  (1.575 m)     Physical exam: Exam: Gen: NAD, conversant Eyes: anicteric sclerae, moist conjunctivae HENT: Atraumatic, oropharynx clear Neck: Trachea midline;  supple,  Lungs: CTA, no wheezing, rales, rhonic                          CV: RRR, no MRG Abdomen: Soft, non-tender;  Extremities: No peripheral edema  Skin: Normal temperature, no rash,  Psych: Appropriate affect, pleasant  Neuro: MS: AA&Ox3, appropriately interactive, normal affect   Speech: fluent w/o paraphasic error  Memory: good recent and remote recall  CN: PERRL, EOMI no nystagmus, no ptosis, sensation intact to LT V1-V3 bilat, face symmetric, no weakness, hearing grossly intact, palate elevates symmetrically, shoulder shrug 5/5 bilat,  tongue protrudes midline, no fasiculations noted.  Motor: normal bulk and tone Strength: 5/5  In all extremities with give-way weakness in bilat LE  Coord: rapid alternating and point-to-point (FNF, HTS) movements intact.  Reflexes: symmetrical, bilat downgoing toes  Sens: LT intact in all extremities  Gait: posture, stance, stride and arm-swing normal. Tandem gait intact. Able to walk on heels and toes. Romberg absent.   Assessment:  After physical and neurologic examination, review of laboratory studies, imaging, neurophysiology testing and pre-existing records, assessment will be reviewed on the problem list.  Plan:  Treatment plan and additional workup will be reviewed under Problem List.  1)Syncope  Ms Upperman is a pleasant 57y/o woman presenting for initial evaluation of episodes of loss of consciousness. She expresses concern these are seizure episodes as she notes generalized jerking during these events. Of note, she is able to recall her whole body jerking during these events. Recently evaluated by cardiology, found to have marked  fluctuations in BP and started on florinef. Her physical exam is  unremarkable. Strongly suspect these are syncopal episodes due to BP fluctuations. There is a low likelihood these events represent seizure activity but will check EEG to complete workup as these events have been occuring frequently.

## 2012-12-31 ENCOUNTER — Encounter: Payer: Self-pay | Admitting: Nurse Practitioner

## 2012-12-31 ENCOUNTER — Ambulatory Visit (INDEPENDENT_AMBULATORY_CARE_PROVIDER_SITE_OTHER): Payer: Medicare Other | Admitting: Nurse Practitioner

## 2012-12-31 ENCOUNTER — Encounter: Payer: Self-pay | Admitting: Internal Medicine

## 2012-12-31 VITALS — BP 138/88 | HR 88 | Temp 97.6°F | Ht 64.0 in | Wt 144.0 lb

## 2012-12-31 DIAGNOSIS — Z4802 Encounter for removal of sutures: Secondary | ICD-10-CM

## 2012-12-31 DIAGNOSIS — R609 Edema, unspecified: Secondary | ICD-10-CM

## 2012-12-31 MED ORDER — OMEPRAZOLE 40 MG PO CPDR: 40.0000 mg | DELAYED_RELEASE_CAPSULE | Freq: Every day | ORAL | Status: DC

## 2012-12-31 MED ORDER — FUROSEMIDE 40 MG PO TABS: 40.0000 mg | ORAL_TABLET | Freq: Two times a day (BID) | ORAL | Status: DC

## 2012-12-31 MED ORDER — BUTALBITAL-APAP-CAFFEINE 50-325-40 MG PO TABS: 1.0000 | ORAL_TABLET | Freq: Two times a day (BID) | ORAL | Status: DC | PRN

## 2012-12-31 NOTE — Progress Notes (Signed)
  Subjective:    Patient ID: [name], female    DOB: 10-25-1955, 57 y.o.   MRN: [id number]  HPI Patient fell and hit her head 2 weeks ago- and went to ER and had staples put in scalp- here today to have staples removed. * also C/o lower ext swelling and gastric reflux    Review of Systems  Constitutional: Negative.   HENT: Negative.   Respiratory: Negative.   Cardiovascular: Negative.   Gastrointestinal: Negative.   Genitourinary: Negative.   Musculoskeletal: Negative.   All other systems reviewed and are negative.       Objective:   Physical Exam  Constitutional: She appears well-developed and well-nourished.  Cardiovascular: Normal rate, regular rhythm, normal heart sounds and intact distal pulses.   Pulmonary/Chest: Effort normal and breath sounds normal.  Musculoskeletal: She exhibits edema (1+ edema bil lower ext.).  Skin:  Scalp laceration edges well approximate- no erythema.    There were no vitals taken for this visit.       Assessment & Plan:   1. Peripheral edema   2. Removal of staple    Orders Placed This Encounter  Procedures  . CMP14+EGFR   Meds ordered this encounter  Medications  . furosemide (LASIX) 40 MG tablet    Sig: Take 1 tablet (40 mg total) by mouth 2 (two) times daily.    Dispense:  60 tablet    Refill:  5    Order Specific Question:  Supervising Provider    Answer:  Ernestina Penna [[id number]]  . butalbital-acetaminophen-caffeine (FIORICET, ESGIC) 50-325-40 MG per tablet    Sig: Take 1 tablet by mouth 2 (two) times daily as needed for headache.    Dispense:  40 tablet    Refill:  0    Order Specific Question:  Supervising Provider    Answer:  Ernestina Penna [[id number]]   Refilled migraine meds Continue all meds Labs pending Diet and exercise encouraged Health maintenance reviewed Follow up in 3 months   [name] Daphine Deutscher, FNP

## 2012-12-31 NOTE — Patient Instructions (Signed)
Peripheral Edema You have swelling in your legs (peripheral edema). This swelling is due to excess accumulation of salt and water in your body. Edema may be a sign of heart, kidney or liver disease, or a side effect of a medication. It may also be due to problems in the leg veins. Elevating your legs and using special support stockings may be very helpful, if the cause of the swelling is due to poor venous circulation. Avoid long periods of standing, whatever the cause. Treatment of edema depends on identifying the cause. Chips, pretzels, pickles and other salty foods should be avoided. Restricting salt in your diet is almost always needed. Water pills (diuretics) are often used to remove the excess salt and water from your body via urine. These medicines prevent the kidney from reabsorbing sodium. This increases urine flow. Diuretic treatment may also result in lowering of potassium levels in your body. Potassium supplements may be needed if you have to use diuretics daily. Daily weights can help you keep track of your progress in clearing your edema. You should call your caregiver for follow up care as recommended. SEEK IMMEDIATE MEDICAL CARE IF:   You have increased swelling, pain, redness, or heat in your legs.  You develop shortness of breath, especially when lying down.  You develop chest or abdominal pain, weakness, or fainting.  You have a fever. Document Released: 04/24/2004 Document Revised: 06/09/2011 Document Reviewed: 04/04/2009 ExitCare Patient Information 2014 [employer].  

## 2013-01-01 LAB — CMP14+EGFR
ALT: 25 IU/L (ref 0–32)
AST: 23 IU/L (ref 0–40)
Albumin/Globulin Ratio: 1.6 (ref 1.1–2.5)
BUN/Creatinine Ratio: 28 — ABNORMAL HIGH (ref 9–23)
CO2: 31 mmol/L — ABNORMAL HIGH (ref 18–29)
Calcium: 9.1 mg/dL (ref 8.7–10.2)
Chloride: 98 mmol/L (ref 97–108)
Creatinine, Ser: 0.86 mg/dL (ref 0.57–1.00)
GFR calc Af Amer: 87 mL/min/{1.73_m2} (ref >59)
Potassium: 4.4 mmol/L (ref 3.5–5.2)
Sodium: 144 mmol/L (ref 134–144)

## 2013-01-04 ENCOUNTER — Other Ambulatory Visit: Payer: Self-pay | Admitting: Nurse Practitioner

## 2013-01-04 ENCOUNTER — Ambulatory Visit (HOSPITAL_BASED_OUTPATIENT_CLINIC_OR_DEPARTMENT_OTHER): Payer: Medicare Other | Admitting: Physical Medicine & Rehabilitation

## 2013-01-04 ENCOUNTER — Encounter: Payer: Self-pay | Admitting: Physical Medicine & Rehabilitation

## 2013-01-04 ENCOUNTER — Encounter: Payer: Medicare Other | Attending: Physical Medicine & Rehabilitation

## 2013-01-04 VITALS — BP 138/81 | HR 80 | Resp 14 | Ht 62.0 in | Wt 139.8 lb

## 2013-01-04 DIAGNOSIS — M545 Low back pain, unspecified: Secondary | ICD-10-CM | POA: Insufficient documentation

## 2013-01-04 DIAGNOSIS — M76899 Other specified enthesopathies of unspecified lower limb, excluding foot: Secondary | ICD-10-CM | POA: Diagnosis not present

## 2013-01-04 DIAGNOSIS — M7061 Trochanteric bursitis, right hip: Secondary | ICD-10-CM

## 2013-01-04 NOTE — Progress Notes (Signed)
Subjective:    Patient ID: [name], female    DOB: [birth year], 57 y.o.   MRN: [id number]  HPI Larey Seat on R hip > 1 week ago Pain Inventory Average Pain 6 Pain Right Now 7 My pain is aching  In the last 24 hours, has pain interfered with the following? General activity 10 Relation with others 10 Enjoyment of life 10 What TIME of day is your pain at its worst? evening Sleep (in general) Poor  Pain is worse with: walking, bending, sitting and standing Pain improves with: rest, medication and injections Relief from Meds: 0  Mobility walk with assistance use a cane  Function I need assistance with the following:  shopping  Neuro/Psych bladder control problems bowel control problems weakness numbness tremor tingling spasms dizziness confusion depression anxiety  Prior Studies Any changes since last visit?  yes  Physicians involved in your care Any changes since last visit?  no   Family History  Problem Relation Age of Onset  . Heart attack Father   . Cancer Father   . Mental illness Father   . Heart attack Brother     stents  . Cancer Mother   . Mental illness Mother    History   Social History  . Marital Status: Single    Spouse Name: N/A    Number of Children: N/A  . Years of Education: N/A   Social History Main Topics  . Smoking status: Never Smoker   . Smokeless tobacco: Never Used  . Alcohol Use: No  . Drug Use: No  . Sexual Activity: None   Other Topics Concern  . None   Social History Narrative   Patient lives at home with her brother.   Patient is on disability.    Patient has 8th grade education.    Patient has 6 children, 20 grand-chilren, and 3 great-grand children.    Past Surgical History  Procedure Laterality Date  . Insertion of implatable loop recorder  08/11/2007    [name]  . Head up tilt table testing  06/15/2007    [name]  . Back surgery    . Doppler echocardiography  2009   Past Medical  History  Diagnosis Date  . Anxiety   . Herpes simplex infection   . Osteoporosis   . Sleep apnea   . Hypertension   . Dyslipidemia   . Depression   . Asthma   . Fibromyalgia   . GERD (gastroesophageal reflux disease)   . Head injury, unspecified   . Supraventricular tachycardia   . Syncope and collapse     s/p ILR; no arhythmogenic cause identified  . Coronary artery disease     reported hx of "MI";  Echo 2009 with normal LVF;  Myoview 05/2011: no ischemia  . Spondylolisthesis   . Spinal stenosis of lumbar region   . Hyperlipidemia   . Bipolar 1 disorder   . Insomnia   . Arthritis     RHEUMATOID   BP 138/81  Pulse 80  Resp 14  Ht 5\' 2"  (1.575 m)  Wt 139 lb 12.8 oz (63.413 kg)  BMI 25.56 kg/m2  SpO2 96%     Review of Systems  Constitutional: Positive for unexpected weight change.  Respiratory: Positive for cough.   Cardiovascular: Positive for leg swelling.  Gastrointestinal: Positive for abdominal pain.  Genitourinary:       Bowel and bladder control problems  Skin: Positive for rash.  Neurological: Positive for dizziness, weakness and  numbness.       Spasms, tingling  Hematological: Bruises/bleeds easily.  Psychiatric/Behavioral: Positive for confusion and dysphoric mood. The patient is nervous/anxious.   All other systems reviewed and are negative.       Objective:   Physical Exam No ecchymosis in R hip No pain with Wt bearing or hip int/ext rotation  Tenderness to palpation R greater troch area       Assessment & Plan:  1.  right greater trochanter bursitis   Right Trochanteric bursa injection  without ultrasound guidance  Indication Trochanteric bursitis. Exam has tenderness over the greater trochanter of the hip. Pain has not responded to conservative care such as exercise therapy and oral medications. Pain interferes with sleep or with mobility Informed consent was obtained after describing risks and benefits of the procedure with the  patient these include bleeding bruising and infection. Patient has signed written consent form. Patient placed in a lateral decubitus position with the affected hip superior. Point of maximal pain was palpated marked and prepped with Betadine and entered with a needle to bone contact. Needle slightly withdrawn then 6mg  of betamethasone with 4 cc 1% lidocaine were injected. Patient tolerated procedure well. Post procedure instructions given.

## 2013-01-04 NOTE — Patient Instructions (Signed)
Trochanteric bursa injection today   trigger point injections next visit if needed

## 2013-01-05 ENCOUNTER — Ambulatory Visit (INDEPENDENT_AMBULATORY_CARE_PROVIDER_SITE_OTHER): Payer: Medicare Other | Admitting: Radiology

## 2013-01-05 DIAGNOSIS — R4182 Altered mental status, unspecified: Secondary | ICD-10-CM

## 2013-01-05 NOTE — Procedures (Signed)
[name] is a 57 year old patient with a history of episodes of jerking and loss of consciousness. The patient is being evaluated for possible seizure-type events.  This is a routine EEG. No skull defects are noted. Medications include atenolol, Cymbalta, diazepam, Florinef, Lasix, gabapentin, Naprosyn, Prilosec, Seroquel, Crestor, Zanaflex, trazodone, Valtrex, and albuterol inhaler.  EEG classification: Dysrhythmia grade 1 left temporal  Description of the recording: The background rhythms of this recording consists of a fairly well modulated medium amplitude alpha rhythm of 10 Hz that is reactive to eye opening and closure. As the record progresses, photic stimulation is performed, and this results in a bilateral and symmetric photic driving response. Hyperventilation was not performed. Throughout the recording, the patient appears to transition in and out of sleep on several occasions, with rudimentary sleep spindles and vertex sharp wave activity seen representing stage II sleep. Intermittently during the recording, mild asymmetric slowing in the theta frequency range was seen emanating from the left mid temporal region. At no time during the recording does there appear to be evidence of actual spike or spike wave discharges. EKG monitor shows no evidence of cardiac rhythm abnormalities with a heart rate of 78.  Impression: This is a minimally abnormal EEG recording due to to mild asymmetric theta slowing emanating from the left mid temporal region. This study would suggest left hemispheric dysfunction, without clear evidence of epileptiform discharges. No electrographic seizures were seen. The patient appears to have right transitions into the sleeping state during the recording. The patient apparently stayed awake the night prior to this EEG study, and functionally this is a sleep deprived study.

## 2013-01-06 ENCOUNTER — Other Ambulatory Visit: Payer: Self-pay | Admitting: Nurse Practitioner

## 2013-01-06 NOTE — Telephone Encounter (Signed)
Phi=one in rx please 0 refills

## 2013-01-06 NOTE — Telephone Encounter (Signed)
Not on med list

## 2013-01-07 ENCOUNTER — Other Ambulatory Visit: Payer: Self-pay | Admitting: Neurology

## 2013-01-07 ENCOUNTER — Telehealth: Payer: Self-pay | Admitting: Neurology

## 2013-01-07 DIAGNOSIS — R569 Unspecified convulsions: Secondary | ICD-10-CM

## 2013-01-07 DIAGNOSIS — R4182 Altered mental status, unspecified: Secondary | ICD-10-CM

## 2013-01-07 NOTE — Telephone Encounter (Signed)
EEG done.  Other tests?

## 2013-01-07 NOTE — Telephone Encounter (Signed)
Rx done. 

## 2013-01-07 NOTE — Telephone Encounter (Signed)
Please let her know I will order a MRI of the brain. She will be contacted by someone to schedule it in the future. Thanks.

## 2013-01-11 ENCOUNTER — Telehealth: Payer: Self-pay | Admitting: Neurology

## 2013-01-11 NOTE — Telephone Encounter (Signed)
MRI has been ordered for patient. Message was left on patients VM. (See note from 01/07/2013). Thanks.

## 2013-01-11 NOTE — Telephone Encounter (Signed)
Call pt and left message informing pt that a MRI had been ordered and someone would be contacting her about this test and if she had any questions or concerns to contact [facility].

## 2013-01-17 DIAGNOSIS — F4310 Post-traumatic stress disorder, unspecified: Secondary | ICD-10-CM | POA: Diagnosis not present

## 2013-01-19 ENCOUNTER — Other Ambulatory Visit: Payer: Self-pay | Admitting: Nurse Practitioner

## 2013-01-25 ENCOUNTER — Telehealth: Payer: Self-pay | Admitting: Internal Medicine

## 2013-01-25 ENCOUNTER — Ambulatory Visit (INDEPENDENT_AMBULATORY_CARE_PROVIDER_SITE_OTHER): Payer: Medicare Other

## 2013-01-25 DIAGNOSIS — Z23 Encounter for immunization: Secondary | ICD-10-CM | POA: Diagnosis not present

## 2013-01-25 NOTE — Telephone Encounter (Signed)
I called patient and she was not home and the man I spoke with did not know where she was.  I have asked that she call me when she returns home.  She needs to push fluids and eat lots of salt

## 2013-01-25 NOTE — Telephone Encounter (Signed)
Follow Up:  Pt states she is returning [name] call.

## 2013-01-25 NOTE — Telephone Encounter (Signed)
New problem  Patients BP is real low 83/43 and heart rate is 50. Patient is very concerned. I spoke with Lauren in Triage and she told me this was average for this patient and to route message to [place].

## 2013-01-29 DIAGNOSIS — E7800 Pure hypercholesterolemia, unspecified: Secondary | ICD-10-CM | POA: Diagnosis not present

## 2013-01-29 DIAGNOSIS — I252 Old myocardial infarction: Secondary | ICD-10-CM | POA: Diagnosis not present

## 2013-01-29 DIAGNOSIS — I509 Heart failure, unspecified: Secondary | ICD-10-CM | POA: Diagnosis not present

## 2013-01-29 DIAGNOSIS — J45909 Unspecified asthma, uncomplicated: Secondary | ICD-10-CM | POA: Diagnosis not present

## 2013-01-29 DIAGNOSIS — K219 Gastro-esophageal reflux disease without esophagitis: Secondary | ICD-10-CM | POA: Diagnosis not present

## 2013-01-29 DIAGNOSIS — I10 Essential (primary) hypertension: Secondary | ICD-10-CM | POA: Diagnosis not present

## 2013-01-29 DIAGNOSIS — R609 Edema, unspecified: Secondary | ICD-10-CM | POA: Diagnosis not present

## 2013-01-29 DIAGNOSIS — J90 Pleural effusion, not elsewhere classified: Secondary | ICD-10-CM | POA: Diagnosis not present

## 2013-01-29 DIAGNOSIS — I519 Heart disease, unspecified: Secondary | ICD-10-CM | POA: Diagnosis not present

## 2013-01-29 DIAGNOSIS — F319 Bipolar disorder, unspecified: Secondary | ICD-10-CM | POA: Diagnosis not present

## 2013-01-29 DIAGNOSIS — Z79899 Other long term (current) drug therapy: Secondary | ICD-10-CM | POA: Diagnosis not present

## 2013-02-01 DIAGNOSIS — R269 Unspecified abnormalities of gait and mobility: Secondary | ICD-10-CM | POA: Diagnosis not present

## 2013-02-02 NOTE — Telephone Encounter (Signed)
talke with patient on 10/28 and she is going to continue taking her medications and push her fluids as she she has been instructed and call back if needed

## 2013-02-22 ENCOUNTER — Other Ambulatory Visit: Payer: Self-pay | Admitting: Physical Medicine & Rehabilitation

## 2013-02-22 ENCOUNTER — Other Ambulatory Visit: Payer: Self-pay | Admitting: Nurse Practitioner

## 2013-02-28 ENCOUNTER — Other Ambulatory Visit: Payer: Self-pay | Admitting: Neurology

## 2013-02-28 ENCOUNTER — Telehealth: Payer: Self-pay | Admitting: *Deleted

## 2013-02-28 DIAGNOSIS — R4182 Altered mental status, unspecified: Secondary | ICD-10-CM

## 2013-02-28 NOTE — Telephone Encounter (Signed)
Please let her know we will just order a head CT. Thanks.

## 2013-03-04 ENCOUNTER — Ambulatory Visit: Payer: Medicare Other | Admitting: Physical Medicine and Rehabilitation

## 2013-03-04 ENCOUNTER — Encounter: Payer: Medicare Other | Attending: Physical Medicine & Rehabilitation

## 2013-03-04 ENCOUNTER — Encounter: Payer: Self-pay | Admitting: Physical Medicine & Rehabilitation

## 2013-03-04 ENCOUNTER — Ambulatory Visit (HOSPITAL_BASED_OUTPATIENT_CLINIC_OR_DEPARTMENT_OTHER): Payer: Medicare Other | Admitting: Physical Medicine & Rehabilitation

## 2013-03-04 VITALS — BP 157/79 | HR 71 | Resp 14 | Ht 62.0 in | Wt 148.2 lb

## 2013-03-04 DIAGNOSIS — IMO0001 Reserved for inherently not codable concepts without codable children: Secondary | ICD-10-CM | POA: Diagnosis not present

## 2013-03-04 DIAGNOSIS — M961 Postlaminectomy syndrome, not elsewhere classified: Secondary | ICD-10-CM | POA: Insufficient documentation

## 2013-03-04 DIAGNOSIS — M810 Age-related osteoporosis without current pathological fracture: Secondary | ICD-10-CM | POA: Insufficient documentation

## 2013-03-04 DIAGNOSIS — Q762 Congenital spondylolisthesis: Secondary | ICD-10-CM | POA: Insufficient documentation

## 2013-03-04 DIAGNOSIS — M47817 Spondylosis without myelopathy or radiculopathy, lumbosacral region: Secondary | ICD-10-CM | POA: Insufficient documentation

## 2013-03-04 DIAGNOSIS — M545 Low back pain, unspecified: Secondary | ICD-10-CM | POA: Diagnosis not present

## 2013-03-04 MED ORDER — PREGABALIN 50 MG PO CAPS: 50.0000 mg | ORAL_CAPSULE | Freq: Two times a day (BID) | ORAL | Status: DC

## 2013-03-04 NOTE — Patient Instructions (Signed)
Trial of Lyrica 50 mg twice a day. This could cause swelling but in about 1/5 patient's  Trigger point injections today.  Return to clinic 3 months, if Lyrica is not helping at this dose we may need to go up you can call is that is the case

## 2013-03-04 NOTE — Telephone Encounter (Signed)
I called patient. Head CT has been scheduled for March 10 2013 at 11:00 a.m.

## 2013-03-04 NOTE — Progress Notes (Signed)
Subjective:    Patient ID: [name], female    DOB: 09-22-1955, 57 y.o.   MRN: [id number]  HPI Patient complains of seizures however has not had a neurologist diagnosed her with this. States that she has some further testing to do.  She does not have a controlled substance agreement with our office. She states her primary care physician is concerned that she cannot prescribe anything for her.  Patient's primary diagnosis is fibromyalgia syndrome primary treatment his activity with secondary treatments including Cymbalta, Lyrica, Pain Inventory Average Pain 9 Pain Right Now 10 My pain is sharp, dull, stabbing, tingling, aching and other  In the last 24 hours, has pain interfered with the following? General activity 10 Relation with others 10 Enjoyment of life 10 What TIME of day is your pain at its worst? all day Sleep (in general) Poor  Pain is worse with: walking, bending, sitting and standing Pain improves with: heat/ice, medication and injections Relief from Meds: 0  Mobility walk with assistance use a cane ability to climb steps?  yes do you drive?  no transfers alone  Function I need assistance with the following:  dressing  Neuro/Psych weakness numbness tremor tingling trouble walking spasms dizziness confusion depression anxiety  Prior Studies Any changes since last visit?  no  Physicians involved in your care Any changes since last visit?  no   Family History  Problem Relation Age of Onset  . Heart attack Father   . Cancer Father   . Mental illness Father   . Heart attack Brother     stents  . Cancer Mother   . Mental illness Mother    History   Social History  . Marital Status: Single    Spouse Name: N/A    Number of Children: N/A  . Years of Education: N/A   Social History Main Topics  . Smoking status: Never Smoker   . Smokeless tobacco: Never Used  . Alcohol Use: No  . Drug Use: No  . Sexual Activity: None    Other Topics Concern  . None   Social History Narrative   Patient lives at home with her brother.   Patient is on disability.    Patient has 8th grade education.    Patient has 6 children, 20 grand-chilren, and 3 great-grand children.    Past Surgical History  Procedure Laterality Date  . Insertion of implatable loop recorder  08/11/2007    [name]  . Head up tilt table testing  06/15/2007    [name]  . Back surgery    . Doppler echocardiography  2009   Past Medical History  Diagnosis Date  . Anxiety   . Herpes simplex infection   . Osteoporosis   . Sleep apnea   . Hypertension   . Dyslipidemia   . Depression   . Asthma   . Fibromyalgia   . GERD (gastroesophageal reflux disease)   . Head injury, unspecified   . Supraventricular tachycardia   . Syncope and collapse     s/p ILR; no arhythmogenic cause identified  . Coronary artery disease     reported hx of "MI";  Echo 2009 with normal LVF;  Myoview 05/2011: no ischemia  . Spondylolisthesis   . Spinal stenosis of lumbar region   . Hyperlipidemia   . Bipolar 1 disorder   . Insomnia   . Arthritis     RHEUMATOID   BP 157/79  Pulse 71  Resp 14  Ht  5\' 2"  (1.575 m)  Wt 148 lb 3.2 oz (67.223 kg)  BMI 27.10 kg/m2  SpO2 97%     Review of Systems  Constitutional: Positive for unexpected weight change.  Respiratory: Positive for shortness of breath and wheezing.   Cardiovascular: Positive for leg swelling.  Gastrointestinal: Positive for abdominal pain.  Musculoskeletal: Positive for back pain and myalgias.  Neurological: Positive for dizziness, tremors, weakness and numbness.       Tingling, spasms  Psychiatric/Behavioral: Positive for confusion and dysphoric mood. The patient is nervous/anxious.        Objective:   Physical Exam Tenderness right lumbar paraspinals L1-L2 L3-L5 Normal motor strength in bilateral lower extremities Mood and affect anxious but otherwise appropriate has exaggerated  pain behaviors when she is getting up and down exam table      Assessment & Plan:  1. Fibromyalgia syndrome. She does also have some myofascial pain.  Will repeat trigger point injections these have been done several months ago with good results.  Trial of Lyrica starting at a small dose. Patient does have some psychological overlay with all her pain complaints with anxiety. We'll start at a small dose. She is concerned about developing swelling, will monitor this.

## 2013-03-07 ENCOUNTER — Encounter: Payer: Self-pay | Admitting: Nurse Practitioner

## 2013-03-07 ENCOUNTER — Ambulatory Visit (INDEPENDENT_AMBULATORY_CARE_PROVIDER_SITE_OTHER): Payer: Medicare Other | Admitting: Nurse Practitioner

## 2013-03-07 VITALS — BP 132/76 | HR 80 | Temp 97.9°F | Ht 62.0 in | Wt 152.0 lb

## 2013-03-07 DIAGNOSIS — K219 Gastro-esophageal reflux disease without esophagitis: Secondary | ICD-10-CM

## 2013-03-07 DIAGNOSIS — J209 Acute bronchitis, unspecified: Secondary | ICD-10-CM

## 2013-03-07 DIAGNOSIS — B009 Herpesviral infection, unspecified: Secondary | ICD-10-CM

## 2013-03-07 MED ORDER — HYDROCODONE-HOMATROPINE 5-1.5 MG/5ML PO SYRP: 5.0000 mL | ORAL_SOLUTION | Freq: Three times a day (TID) | ORAL | Status: DC | PRN

## 2013-03-07 MED ORDER — VALACYCLOVIR HCL 500 MG PO TABS: 500.0000 mg | ORAL_TABLET | Freq: Two times a day (BID) | ORAL | Status: DC

## 2013-03-07 MED ORDER — PANTOPRAZOLE SODIUM 40 MG IV SOLR: 40.0000 mg | Freq: Every day | INTRAVENOUS | Status: DC

## 2013-03-07 MED ORDER — AZITHROMYCIN 250 MG PO TABS: ORAL_TABLET | ORAL | Status: DC

## 2013-03-07 NOTE — Progress Notes (Signed)
Subjective:    Patient ID: [name], female    DOB: 01-Dec-1955, 57 y.o.   MRN: [id number]  HPI patient here today c/o cough and congestion that started 2 days ago- low grade fever and chills at night- productive cough  * had a recent herpes outbreak and is out of meds *Omeprazole is not working for GERD- needs a new med  Review of Systems  Constitutional: Positive for fever, chills and appetite change (decrease).  HENT: Positive for congestion, ear pain, rhinorrhea and sinus pressure.   Eyes: Negative.   Respiratory: Positive for cough (productive).   Cardiovascular: Negative.   Gastrointestinal: Negative.        Objective:   Physical Exam  Constitutional: She is oriented to person, place, and time. She appears well-developed and well-nourished.  HENT:  Right Ear: Hearing, tympanic membrane, external ear and ear canal normal.  Left Ear: Hearing, tympanic membrane, external ear and ear canal normal.  Nose: Mucosal edema and rhinorrhea present. Right sinus exhibits maxillary sinus tenderness and frontal sinus tenderness. Left sinus exhibits maxillary sinus tenderness and frontal sinus tenderness.  Mouth/Throat: Uvula is midline, oropharynx is clear and moist and mucous membranes are normal.  Pulmonary/Chest: Effort normal.  ronchi bilaterally- deep cough  Neurological: She is alert and oriented to person, place, and time.  Skin: Skin is warm and dry.    BP 132/76  Pulse 80  Temp(Src) 97.9 F (36.6 C) (Oral)  Ht 5\' 2"  (1.575 m)  Wt 152 lb (68.947 kg)  BMI 27.79 kg/m2       Assessment & Plan:   1. GERD (gastroesophageal reflux disease)   2. HSV-2 (herpes simplex virus 2) infection   3. Acute bronchitis    Meds ordered this encounter  Medications  . azithromycin (ZITHROMAX Z-PAK) 250 MG tablet    Sig: As directed    Dispense:  6 each    Refill:  0    Order Specific Question:  Supervising Provider    Answer:  Ernestina Penna [[id number]]  .  HYDROcodone-homatropine (HYCODAN) 5-1.5 MG/5ML syrup    Sig: Take 5 mLs by mouth every 8 (eight) hours as needed for cough.    Dispense:  120 mL    Refill:  0    Order Specific Question:  Supervising Provider    Answer:  Ernestina Penna [[id number]]  . pantoprazole (PROTONIX) 40 MG injection    Sig: Inject 40 mg into the vein at bedtime.    Dispense:  30 each    Refill:  5    Order Specific Question:  Supervising Provider    Answer:  Ernestina Penna [[id number]]  . valACYclovir (VALTREX) 500 MG tablet    Sig: Take 1 tablet (500 mg total) by mouth 2 (two) times daily.    Dispense:  60 tablet    Refill:  5    Order Specific Question:  Supervising Provider    Answer:  Ernestina Penna [[id number]]   1. Take meds as prescribed 2. Use a cool mist humidifier especially during the winter months and when heat has  been humid. 3. Use saline nose sprays frequently 4. Saline irrigations of the nose can be very helpful if done frequently.  * 4X daily for 1 week*  * Use of a nettie pot can be helpful with this. Follow directions with this* 5. Drink plenty of fluids 6. Keep thermostat turn down low 7.For any cough or congestion  Use plain Mucinex- regular strength or max  strength is fine   * Children- consult with Pharmacist for dosing 8. For fever or aces or pains- take tylenol or ibuprofen appropriate for age and weight.  * for fevers greater than 101 orally you may alternate ibuprofen and tylenol every  3 hours.   Avoid spicy and fatty foods- do not eat 2 hours prior to bedtime- Changed omeprazole to protonix Discussed HSV outbreaks   Erica Daphine Deutscher, FNP

## 2013-03-07 NOTE — Patient Instructions (Signed)
Bronchitis °Bronchitis is the body's way of reacting to injury and/or infection (inflammation) of the bronchi. Bronchi are the air tubes that extend from the windpipe into the lungs. If the inflammation becomes severe, it may cause shortness of breath. °CAUSES  °Inflammation may be caused by: °· A virus. °· Germs (bacteria). °· Dust. °· Allergens. °· Pollutants and many other irritants. °The cells lining the bronchial tree are covered with tiny hairs (cilia). These constantly beat upward, away from the lungs, toward the mouth. This keeps the lungs free of pollutants. When these cells become too irritated and are unable to do their job, mucus begins to develop. This causes the characteristic cough of bronchitis. The cough clears the lungs when the cilia are unable to do their job. Without either of these protective mechanisms, the mucus would settle in the lungs. Then you would develop pneumonia. °Smoking is a common cause of bronchitis and can contribute to pneumonia. Stopping this habit is the single most important thing you can do to help yourself. °TREATMENT  °· Your caregiver may prescribe an antibiotic if the cough is caused by bacteria. Also, medicines that open up your airways make it easier to breathe. Your caregiver may also recommend or prescribe an expectorant. It will loosen the mucus to be coughed up. Only take over-the-counter or prescription medicines for pain, discomfort, or fever as directed by your caregiver. °· Removing whatever causes the problem (smoking, for example) is critical to preventing the problem from getting worse. °· Cough suppressants may be prescribed for relief of cough symptoms. °· Inhaled medicines may be prescribed to help with symptoms now and to help prevent problems from returning. °· For those with recurrent (chronic) bronchitis, there may be a need for steroid medicines. °SEEK IMMEDIATE MEDICAL CARE IF:  °· During treatment, you develop more pus-like mucus (purulent  sputum). °· You have a fever. °· You become progressively more ill. °· You have increased difficulty breathing, wheezing, or shortness of breath. °It is necessary to seek immediate medical care if you are elderly or sick from any other disease. °MAKE SURE YOU:  °· Understand these instructions. °· Will watch your condition. °· Will get help right away if you are not doing well or get worse. °Document Released: 03/17/2005 Document Revised: 11/17/2012 Document Reviewed: 11/09/2012 °ExitCare® Patient Information ©2014 [employer]. ° °

## 2013-03-10 ENCOUNTER — Ambulatory Visit
Admission: RE | Admit: 2013-03-10 | Discharge: 2013-03-10 | Disposition: A | Discharge status: Home / Self Care | Payer: Medicare Other | Source: Ambulatory Visit | Attending: Neurology | Admitting: Neurology

## 2013-03-10 DIAGNOSIS — R9409 Abnormal results of other function studies of central nervous system: Secondary | ICD-10-CM | POA: Diagnosis not present

## 2013-03-10 DIAGNOSIS — R4182 Altered mental status, unspecified: Secondary | ICD-10-CM | POA: Diagnosis not present

## 2013-03-11 ENCOUNTER — Telehealth: Payer: Self-pay | Admitting: Nurse Practitioner

## 2013-03-11 NOTE — Telephone Encounter (Signed)
Give it a few more days.

## 2013-03-14 MED ORDER — BENZONATATE 100 MG PO CAPS: 100.0000 mg | ORAL_CAPSULE | Freq: Two times a day (BID) | ORAL | Status: DC | PRN

## 2013-03-14 MED ORDER — PREDNISONE 20 MG PO TABS: ORAL_TABLET | ORAL | Status: DC

## 2013-03-14 NOTE — Telephone Encounter (Signed)
Still wheezing and still coughing. No fever. Finished antibiotic and wasn't  prescribed steroids per pt. Needs something else called in or does she ntbs again? [facility]. Call pt back either way.

## 2013-03-14 NOTE — Telephone Encounter (Signed)
Prednisone and tessalon perles sent to pharmacy

## 2013-03-14 NOTE — Telephone Encounter (Signed)
The cough may linger for several weeks- does patient  Want steriod?

## 2013-03-14 NOTE — Telephone Encounter (Signed)
Yes if you think it will help the wheezing and she needs some cough medicine. She is taking inhaler

## 2013-03-14 NOTE — Progress Notes (Signed)
Quick Note:  Shared normal CT/head results thru VM message. ______

## 2013-03-15 NOTE — Telephone Encounter (Signed)
ok 

## 2013-03-15 NOTE — Telephone Encounter (Signed)
Pt taking rx,s and wants MMM to know how thankful she is.

## 2013-03-16 DIAGNOSIS — F4310 Post-traumatic stress disorder, unspecified: Secondary | ICD-10-CM | POA: Diagnosis not present

## 2013-03-23 ENCOUNTER — Telehealth: Payer: Self-pay | Admitting: Nurse Practitioner

## 2013-03-24 DIAGNOSIS — Z79899 Other long term (current) drug therapy: Secondary | ICD-10-CM | POA: Diagnosis not present

## 2013-03-24 DIAGNOSIS — I519 Heart disease, unspecified: Secondary | ICD-10-CM | POA: Diagnosis not present

## 2013-03-24 DIAGNOSIS — R05 Cough: Secondary | ICD-10-CM | POA: Diagnosis not present

## 2013-03-24 DIAGNOSIS — K219 Gastro-esophageal reflux disease without esophagitis: Secondary | ICD-10-CM | POA: Diagnosis not present

## 2013-03-24 DIAGNOSIS — I252 Old myocardial infarction: Secondary | ICD-10-CM | POA: Diagnosis not present

## 2013-03-24 DIAGNOSIS — A6000 Herpesviral infection of urogenital system, unspecified: Secondary | ICD-10-CM | POA: Diagnosis not present

## 2013-03-24 DIAGNOSIS — G40909 Epilepsy, unspecified, not intractable, without status epilepticus: Secondary | ICD-10-CM | POA: Diagnosis not present

## 2013-03-24 DIAGNOSIS — E7800 Pure hypercholesterolemia, unspecified: Secondary | ICD-10-CM | POA: Diagnosis not present

## 2013-03-24 DIAGNOSIS — J45909 Unspecified asthma, uncomplicated: Secondary | ICD-10-CM | POA: Diagnosis not present

## 2013-03-24 DIAGNOSIS — F4310 Post-traumatic stress disorder, unspecified: Secondary | ICD-10-CM | POA: Diagnosis not present

## 2013-03-24 DIAGNOSIS — J40 Bronchitis, not specified as acute or chronic: Secondary | ICD-10-CM | POA: Diagnosis not present

## 2013-03-24 DIAGNOSIS — R059 Cough, unspecified: Secondary | ICD-10-CM | POA: Diagnosis not present

## 2013-03-24 DIAGNOSIS — I10 Essential (primary) hypertension: Secondary | ICD-10-CM | POA: Diagnosis not present

## 2013-03-24 DIAGNOSIS — Z9861 Coronary angioplasty status: Secondary | ICD-10-CM | POA: Diagnosis not present

## 2013-03-24 DIAGNOSIS — F4481 Dissociative identity disorder: Secondary | ICD-10-CM | POA: Diagnosis not present

## 2013-03-24 DIAGNOSIS — F319 Bipolar disorder, unspecified: Secondary | ICD-10-CM | POA: Diagnosis not present

## 2013-03-28 ENCOUNTER — Telehealth: Payer: Self-pay | Admitting: Nurse Practitioner

## 2013-03-28 ENCOUNTER — Other Ambulatory Visit: Payer: Self-pay | Admitting: Nurse Practitioner

## 2013-03-28 DIAGNOSIS — J42 Unspecified chronic bronchitis: Secondary | ICD-10-CM | POA: Diagnosis not present

## 2013-03-28 NOTE — Telephone Encounter (Signed)
Patient aware will call us back if she does not feel better

## 2013-03-28 NOTE — Telephone Encounter (Signed)
Patient advised to go to urgent care

## 2013-03-28 NOTE — Telephone Encounter (Signed)
Motrin OTC or will need to be seen

## 2013-03-30 DIAGNOSIS — R059 Cough, unspecified: Secondary | ICD-10-CM | POA: Diagnosis not present

## 2013-03-30 DIAGNOSIS — R0602 Shortness of breath: Secondary | ICD-10-CM | POA: Diagnosis not present

## 2013-03-30 DIAGNOSIS — R05 Cough: Secondary | ICD-10-CM | POA: Diagnosis not present

## 2013-03-30 DIAGNOSIS — Z5320 Procedure and treatment not carried out because of patient's decision for unspecified reasons: Secondary | ICD-10-CM | POA: Diagnosis not present

## 2013-03-30 NOTE — Telephone Encounter (Signed)
Please call in fioricet 

## 2013-03-30 NOTE — Telephone Encounter (Signed)
Please advise 

## 2013-04-01 ENCOUNTER — Other Ambulatory Visit: Payer: Self-pay | Admitting: Nurse Practitioner

## 2013-04-01 ENCOUNTER — Ambulatory Visit (INDEPENDENT_AMBULATORY_CARE_PROVIDER_SITE_OTHER): Payer: Medicare Other | Admitting: Family Medicine

## 2013-04-01 ENCOUNTER — Ambulatory Visit (INDEPENDENT_AMBULATORY_CARE_PROVIDER_SITE_OTHER): Payer: Medicare Other

## 2013-04-01 ENCOUNTER — Encounter: Payer: Self-pay | Admitting: Family Medicine

## 2013-04-01 VITALS — BP 153/91 | HR 128 | Temp 97.7°F | Ht 62.0 in | Wt 152.0 lb

## 2013-04-01 DIAGNOSIS — J209 Acute bronchitis, unspecified: Secondary | ICD-10-CM

## 2013-04-01 DIAGNOSIS — R0989 Other specified symptoms and signs involving the circulatory and respiratory systems: Secondary | ICD-10-CM

## 2013-04-01 DIAGNOSIS — B379 Candidiasis, unspecified: Secondary | ICD-10-CM | POA: Diagnosis not present

## 2013-04-01 MED ORDER — AMOXICILLIN 875 MG PO TABS: 875.0000 mg | ORAL_TABLET | Freq: Two times a day (BID) | ORAL | Status: DC

## 2013-04-01 MED ORDER — LEVALBUTEROL HCL 1.25 MG/0.5ML IN NEBU
1.2500 mg | INHALATION_SOLUTION | Freq: Once | RESPIRATORY_TRACT | Status: AC
Start: 2013-04-01 — End: 2013-04-01
  Administered 2013-04-01: 1.25 mg via RESPIRATORY_TRACT

## 2013-04-01 MED ORDER — HYDROCODONE-HOMATROPINE 5-1.5 MG/5ML PO SYRP
5.0000 mL | ORAL_SOLUTION | Freq: Three times a day (TID) | ORAL | Status: DC | PRN
Start: 2013-04-01 — End: 2013-04-12

## 2013-04-01 MED ORDER — FLUCONAZOLE 150 MG PO TABS: 150.0000 mg | ORAL_TABLET | Freq: Once | ORAL | Status: DC

## 2013-04-01 MED ORDER — PREDNISONE 10 MG PO TABS: ORAL_TABLET | ORAL | Status: DC

## 2013-04-01 MED ORDER — METHYLPREDNISOLONE ACETATE 80 MG/ML IJ SUSP
80.0000 mg | Freq: Once | INTRAMUSCULAR | Status: AC
  Administered 2013-04-01: 80 mg via INTRAMUSCULAR

## 2013-04-01 MED ORDER — NYSTATIN 100000 UNIT/GM EX CREA: 1.0000 "application " | TOPICAL_CREAM | Freq: Two times a day (BID) | CUTANEOUS | Status: DC

## 2013-04-01 MED ORDER — ALBUTEROL SULFATE (2.5 MG/3ML) 0.083% IN NEBU: 2.5000 mg | INHALATION_SOLUTION | Freq: Four times a day (QID) | RESPIRATORY_TRACT | Status: DC | PRN

## 2013-04-01 NOTE — Patient Instructions (Addendum)
Bronchitis Bronchitis is the body's way of reacting to injury and/or infection (inflammation) of the bronchi. Bronchi are the air tubes that extend from the windpipe into the lungs. If the inflammation becomes severe, it may cause shortness of breath. CAUSES  Inflammation may be caused by:  A virus.  Germs (bacteria).  Dust.  Allergens.  Pollutants and many other irritants. The cells lining the bronchial tree are covered with tiny hairs (cilia). These constantly beat upward, away from the lungs, toward the mouth. This keeps the lungs free of pollutants. When these cells become too irritated and are unable to do their job, mucus begins to develop. This causes the characteristic cough of bronchitis. The cough clears the lungs when the cilia are unable to do their job. Without either of these protective mechanisms, the mucus would settle in the lungs. Then you would develop pneumonia. Smoking is a common cause of bronchitis and can contribute to pneumonia. Stopping this habit is the single most important thing you can do to help yourself. TREATMENT   Your caregiver may prescribe an antibiotic if the cough is caused by bacteria. Also, medicines that open up your airways make it easier to breathe. Your caregiver may also recommend or prescribe an expectorant. It will loosen the mucus to be coughed up. Only take over-the-counter or prescription medicines for pain, discomfort, or fever as directed by your caregiver.  Removing whatever causes the problem (smoking, for example) is critical to preventing the problem from getting worse.  Cough suppressants may be prescribed for relief of cough symptoms.  Inhaled medicines may be prescribed to help with symptoms now and to help prevent problems from returning.  For those with recurrent (chronic) bronchitis, there may be a need for steroid medicines. SEEK IMMEDIATE MEDICAL CARE IF:   During treatment, you develop more pus-like mucus (purulent  sputum).  You have a fever.  You become progressively more ill.  You have increased difficulty breathing, wheezing, or shortness of breath. It is necessary to seek immediate medical care if you are elderly or sick from any other disease. MAKE SURE YOU:   Understand these instructions.  Will watch your condition.  Will get help right away if you are not doing well or get worse. Document Released: 03/17/2005 Document Revised: 11/17/2012 Document Reviewed: 11/09/2012 [facility] Patient Information 2014 [place], [place]. Bronchitis Bronchitis is the body's way of reacting to injury and/or infection (inflammation) of the bronchi. Bronchi are the air tubes that extend from the windpipe into the lungs. If the inflammation becomes severe, it may cause shortness of breath. CAUSES  Inflammation may be caused by:  A virus.  Germs (bacteria).  Dust.  Allergens.  Pollutants and many other irritants. The cells lining the bronchial tree are covered with tiny hairs (cilia). These constantly beat upward, away from the lungs, toward the mouth. This keeps the lungs free of pollutants. When these cells become too irritated and are unable to do their job, mucus begins to develop. This causes the characteristic cough of bronchitis. The cough clears the lungs when the cilia are unable to do their job. Without either of these protective mechanisms, the mucus would settle in the lungs. Then you would develop pneumonia. Smoking is a common cause of bronchitis and can contribute to pneumonia. Stopping this habit is the single most important thing you can do to help yourself. TREATMENT   Your caregiver may prescribe an antibiotic if the cough is caused by bacteria. Also, medicines that open up your airways make  it easier to breathe. Your caregiver may also recommend or prescribe an expectorant. It will loosen the mucus to be coughed up. Only take over-the-counter or prescription medicines for pain, discomfort,  or fever as directed by your caregiver.  Removing whatever causes the problem (smoking, for example) is critical to preventing the problem from getting worse.  Cough suppressants may be prescribed for relief of cough symptoms.  Inhaled medicines may be prescribed to help with symptoms now and to help prevent problems from returning.  For those with recurrent (chronic) bronchitis, there may be a need for steroid medicines. SEEK IMMEDIATE MEDICAL CARE IF:   During treatment, you develop more pus-like mucus (purulent sputum).  You have a fever.  You become progressively more ill.  You have increased difficulty breathing, wheezing, or shortness of breath. It is necessary to seek immediate medical care if you are elderly or sick from any other disease. MAKE SURE YOU:   Understand these instructions.  Will watch your condition.  Will get help right away if you are not doing well or get worse. Document Released: 03/17/2005 Document Revised: 11/17/2012 Document Reviewed: 11/09/2012 [facility] Patient Information 2014 [place], [place].

## 2013-04-01 NOTE — Progress Notes (Signed)
   Subjective:    Patient ID: [name], female    DOB: [birth year], 57 y.o.   MRN: [id number]  HPI This 58 y.o. female presents for evaluation of persistent cough and uri sx's for over a week. She states she has been tx multiple times over the last months for Bronchitis.  Shs has hx Of asthma.  She is using her SABA MDI inhaler and she is still tight.  She c/o vaginal yeast infection.  Review of Systems C/o cough No chest pain, SOB, HA, dizziness, vision change, N/V, diarrhea, constipation, dysuria, urinary urgency or frequency, myalgias, arthralgias or rash.     Objective:   Physical Exam  Vital signs noted  Well developed well nourished female.  HEENT - Head atraumatic Normocephalic                Eyes - PERRLA, Conjuctiva - clear Sclera- Clear EOMI                Ears - EAC's Wnl TM's Wnl Gross Hearing WNL                Nose - Nares patent                 Throat - oropharanx wnl Respiratory - Lungs with expiratory wheezes throughout Cardiac - RRR S1 and S2 without murmur GI - Abdomen soft Nontender and bowel sounds active x 4 Extremities - No edema. Neuro - Grossly intact.  Post Neb tx - Lungs with exp wheezes bilateral and patient is not coughing.  CXR - No infiltrates   Prelimnary reading by Angeline Slim Assessment & Plan:  Chest congestion - Plan: DG Chest 2 View, levalbuterol (XOPENEX) nebulizer solution 1.25 mg, methylPREDNISolone acetate (DEPO-MEDROL) injection 80 mg, amoxicillin (AMOXIL) 875 MG tablet, DME Nebulizer machine, albuterol (PROVENTIL) (2.5 MG/3ML) 0.083% nebulizer solution, HYDROcodone-homatropine (HYCODAN) 5-1.5 MG/5ML syrup, predniSONE (DELTASONE) 10 MG tablet, DISCONTINUED: predniSONE (DELTASONE) 10 MG tablet  Acute bronchitis - Plan: levalbuterol (XOPENEX) nebulizer solution 1.25 mg, methylPREDNISolone acetate (DEPO-MEDROL) injection 80 mg, amoxicillin (AMOXIL) 875 MG tablet, DME Nebulizer machine, albuterol (PROVENTIL) (2.5  MG/3ML) 0.083% nebulizer solution, HYDROcodone-homatropine (HYCODAN) 5-1.5 MG/5ML syrup, predniSONE (DELTASONE) 10 MG tablet, DISCONTINUED: predniSONE (DELTASONE) 10 MG tablet  Candidiasis - Plan: fluconazole (DIFLUCAN) 150 MG tablet, nystatin cream (MYCOSTATIN)  Push po fluids, rest, tylenol and motrin otc prn as directed for fever, arthralgias, and myalgias.  Follow up prn if sx's continue or persist.   Deatra Canter FNP

## 2013-04-04 NOTE — Telephone Encounter (Signed)
rx called into pharmacy

## 2013-04-07 ENCOUNTER — Telehealth: Payer: Self-pay | Admitting: Neurology

## 2013-04-07 ENCOUNTER — Telehealth: Payer: Self-pay | Admitting: Internal Medicine

## 2013-04-07 NOTE — Telephone Encounter (Signed)
Please let her know her episodes are likely syncopal in nature. She should first follow up with cardiology to see if they need to adjust her florinef. Thanks.

## 2013-04-07 NOTE — Telephone Encounter (Signed)
Patient called to state that she has had 3 black out spells this week and wants to know if she needs to come and be seen, patient states she was baking a cake when it happened. Please call the patient.

## 2013-04-07 NOTE — Telephone Encounter (Addendum)
Having episodes/seziure/ and BP low.  This all started back when she was taken off Xanax back in Aug due an OD.  She says the seizures are getting worse and wants to know what to do.  I have asked her to call [facility] who follows her for this.  She says she has already placed a call and waiting to hear back.  She will call back if needed

## 2013-04-07 NOTE — Telephone Encounter (Signed)
New message     Talk to nurse regarding fainting spell yesterday

## 2013-04-08 NOTE — Telephone Encounter (Signed)
This patient call this morning and I relayed your message but she states she called Cardiology and they told her to follow up with you. Do you want me to schedule a follow up?

## 2013-04-08 NOTE — Telephone Encounter (Signed)
That is fine. Schedule her for the next available follow up slot. Thanks.

## 2013-04-08 NOTE — Telephone Encounter (Signed)
Patient has been sched 04/12/13

## 2013-04-12 ENCOUNTER — Encounter: Payer: Self-pay | Admitting: Neurology

## 2013-04-12 ENCOUNTER — Ambulatory Visit (INDEPENDENT_AMBULATORY_CARE_PROVIDER_SITE_OTHER): Payer: Medicare Other | Admitting: Neurology

## 2013-04-12 VITALS — BP 160/81 | HR 120 | Ht 61.0 in | Wt 155.0 lb

## 2013-04-12 DIAGNOSIS — R4182 Altered mental status, unspecified: Secondary | ICD-10-CM

## 2013-04-12 NOTE — Patient Instructions (Signed)
Overall you are doing fairly well but I do want to suggest a few things today:   Remember to drink plenty of fluid, eat healthy meals and do not skip any meals. Try to eat protein with a every meal and eat a healthy snack such as fruit or nuts in between meals. Try to keep a regular sleep-wake schedule and try to exercise daily, particularly in the form of walking, 20-30 minutes a day, if you can.   We will hold off on starting a seizure medication at this time. I would like to refer you to [facility] for an ambulatory EEG to hopefully capture one of your events on the monitor. Depending on what this shows we will consider starting you on a seizure medication.  Please follow up with cardiology for continued monitoring of your blood pressure and heart rate.  Please avoid driving while we continue to work up the cause of your events.   Follow up once ambulatory EEG completed.  Please call us with any interim questions, concerns, problems, updates or refill requests.   Please also call us for any test results so we can go over those with you on the phone.  My clinical assistant and will answer any of your questions and relay your messages to me and also relay most of my messages to you.   Our phone number is [phone]. We also have an after hours call service for urgent matters and there is a physician on-call for urgent questions. For any emergencies you know to call 911 or go to the nearest emergency room

## 2013-04-12 NOTE — Progress Notes (Signed)
[facility]  Provider:  Dr Hosie Poisson Referring Provider: Ernestina Penna, MD Primary Care Physician:  Rudi Heap, MD  CC:  Loss of consciousness   HPI:  [name] is a 58 y.o. female here as a follow up from Dr. Christell Constant for episodes of loss of consciousness and concern of recurrent seizures. Since last visit she had an EEG done showing mild L temporal slowing but otherwise unremarkable. She continues to follow up with cardiology for evaluation/treatment of hypotension. Returns today with concerns of increased episodes of LOC. States she has had 3 episodes in the past week. Prior to the event she can get light headed sometime. No change in vision, no strange smell or sound. She reports that people around her state she talks like she is drunk, state she shakes when on the ground. She recalls shaking when on the ground but reports overall poor memory of the event. She cannot hear people during the event. She reports these events increased in frequency when she recently stopped her Xanax. She states they stopped the xanax, switched her to a valium taper to get her off them.   She reports taking her BP 4x a day, continues to have severe fluctuations in her BP and HR. Does not have a scheduled cardiology follow up, remains on florinef.   Initial visit 12/28/2012 Having passing out episodes for years. Notes she has been having some jerks in her sleep and now during the daytime. Occurs when she blacks out, feels her whole body jerking, states there is nothing she can do about it. She reports it lasts for 5 to 10 minutes, she can remember the jerking but doesn't remember what people are saying around her. Recently evaluated by cardiology, had cardiac monitoring with noted fluctuations in BP from SBP 70s up to 170s. She reports having had a seizure while in her sleep study. She is unsure what the official report of sleep study showed. Denies any history of head trauma. No prior  seizure history.   Sleep study from 10/2012 reviewed and results found to be unremarkable.   Cardiology note from 11/2012: Her blood work was essentially normal but her cardiac monitor did show excursions of her blood pressure, predominantly low but at times high. Her blood pressures average 120 to 130 but at times dropped down into the 70s as well as up into the 170s. While she wore her monitor, she had no syncope.   Review of Systems: Out of a complete 14 system review, the patient complains of only the following symptoms, and all other reviewed systems are negative. Positive for memory loss headache seizure tremor test no joint pain joint swelling back pain aching muscles muscle cramps excessive thirst weight change facial swelling palpitations History   Social History  . Marital Status: Single    Spouse Name: N/A    Number of Children: N/A  . Years of Education: N/A   Occupational History  . Not on file.   Social History Main Topics  . Smoking status: Never Smoker   . Smokeless tobacco: Never Used  . Alcohol Use: No  . Drug Use: No  . Sexual Activity: Not on file   Other Topics Concern  . Not on file   Social History Narrative   Patient lives at home with her brother.   Patient is on disability.    Patient has 8th grade education.    Patient has 6 children, 20 grand-chilren, and 3 great-grand children.  Family History  Problem Relation Age of Onset  . Heart attack Father   . Cancer Father   . Mental illness Father   . Heart attack Brother     stents  . Cancer Mother   . Mental illness Mother     Past Medical History  Diagnosis Date  . Anxiety   . Herpes simplex infection   . Osteoporosis   . Sleep apnea   . Hypertension   . Dyslipidemia   . Depression   . Asthma   . Fibromyalgia   . GERD (gastroesophageal reflux disease)   . Head injury, unspecified   . Supraventricular tachycardia   . Syncope and collapse     s/p ILR; no arhythmogenic cause  identified  . Coronary artery disease     reported hx of "MI";  Echo 2009 with normal LVF;  Myoview 05/2011: no ischemia  . Spondylolisthesis   . Spinal stenosis of lumbar region   . Hyperlipidemia   . Bipolar 1 disorder   . Insomnia   . Arthritis     RHEUMATOID    Past Surgical History  Procedure Laterality Date  . Insertion of implatable loop recorder  08/11/2007    [name]  . Head up tilt table testing  06/15/2007    [name]  . Back surgery    . Doppler echocardiography  2009    Current Outpatient Prescriptions  Medication Sig Dispense Refill  . albuterol (PROVENTIL) (2.5 MG/3ML) 0.083% nebulizer solution Take 3 mLs (2.5 mg total) by nebulization every 6 (six) hours as needed for wheezing or shortness of breath.  150 mL  1  . atenolol (TENORMIN) 25 MG tablet Take 1 tablet (25 mg total) by mouth daily.      . butalbital-acetaminophen-caffeine (FIORICET, ESGIC) 50-325-40 MG per tablet TAKE 1 TABLET TWICE DAILY AS NEEDED FOR HEADACHE.  40 tablet  0  . CYMBALTA 60 MG capsule Take 1 capsule by mouth at bedtime.      . diazepam (VALIUM) 10 MG tablet       . fluconazole (DIFLUCAN) 150 MG tablet Take 1 tablet (150 mg total) by mouth once.  3 tablet  1  . fludrocortisone (FLORINEF) 0.1 MG tablet Take 0.5 tablets (0.05 mg total) by mouth 2 (two) times daily.  30 tablet  6  . furosemide (LASIX) 40 MG tablet Take 1 tablet (40 mg total) by mouth 2 (two) times daily.  60 tablet  5  . naproxen (NAPROSYN) 500 MG tablet TAKE 1 TABLET BY MOUTH TWICE DAILY WITH MEALS.  60 tablet  0  . nitroGLYCERIN (NITROLINGUAL) 0.4 MG/SPRAY spray USE (1) SPRAY UNDER TONGUE AS NEEDED FOR CHEST PAIN.  12 g  0  . nystatin cream (MYCOSTATIN) Apply 1 application topically 2 (two) times daily.  30 g  0  . predniSONE (DELTASONE) 10 MG tablet Take 4 po qd x 2days then 3 po qd x 2 days then 2 po qd x 2 days then one po qd x 2days  20 tablet  0  . QUEtiapine (SEROQUEL) 50 MG tablet Take 1 tablet by mouth at  bedtime.      . rosuvastatin (CRESTOR) 20 MG tablet Take 1 tablet (20 mg total) by mouth daily. (HOLD UNTIL FOLLOW UP WITH PCP)      . rosuvastatin (CRESTOR) 20 MG tablet Take 0.5 tablets (10 mg total) by mouth daily.  30 tablet  3  . tiZANidine (ZANAFLEX) 4 MG tablet TAKE 1 TABLET BY MOUTH 3  TIMES DAILY.  90 tablet  1  . traZODone (DESYREL) 100 MG tablet Take 200 mg by mouth daily.        . valACYclovir (VALTREX) 500 MG tablet Take 1 tablet (500 mg total) by mouth 2 (two) times daily.  60 tablet  5  . VENTOLIN HFA 108 (90 BASE) MCG/ACT inhaler INHALE 2 PUFFS EVERY 6 HOURS AS NEEDED.  18 g  2   No current facility-administered medications for this visit.    Allergies as of 04/12/2013 - Review Complete 04/12/2013  Allergen Reaction Noted  . Codeine Other (See Comments)   . Morphine and related Other (See Comments) 08/12/2010    Vitals: BP 160/81  Pulse 120  Ht 5\' 1"  (1.549 m)  Wt 155 lb (70.308 kg)  BMI 29.30 kg/m2 Last Weight:  Wt Readings from Last 1 Encounters:  04/12/13 155 lb (70.308 kg)   Last Height:   Ht Readings from Last 1 Encounters:  04/12/13 5\' 1"  (1.549 m)     Physical exam: Exam: Gen: NAD, conversant Eyes: anicteric sclerae, moist conjunctivae HENT: Atraumatic, oropharynx clear Neck: Trachea midline; supple,  Lungs: CTA, no wheezing, rales, rhonic                          CV: RRR, no MRG Abdomen: Soft, non-tender;  Extremities: No peripheral edema  Skin: Normal temperature, no rash,  Psych: Appropriate affect, pleasant  Neuro: Erica: AA&Ox3, appropriately interactive, normal affect   Speech: fluent w/o paraphasic error  Memory: good recent and remote recall  CN: PERRL, EOMI no nystagmus, no ptosis, sensation intact to LT V1-V3 bilat, face symmetric, no weakness, hearing grossly intact, palate elevates symmetrically, shoulder shrug 5/5 bilat,  tongue protrudes midline, no fasiculations noted.  Motor: normal bulk and tone Strength: 5/5  In all  extremities with give-way weakness in bilat LE  Coord: rapid alternating and point-to-point (FNF, HTS) movements intact.  Reflexes: symmetrical, bilat downgoing toes  Sens: LT intact in all extremities  Gait: posture, stance, stride and arm-swing normal. Tandem gait intact. Able to walk on heels and toes. Romberg absent.   Assessment:  After physical and neurologic examination, review of laboratory studies, imaging, neurophysiology testing and pre-existing records, assessment will be reviewed on the problem list.  Plan:  Treatment plan and additional workup will be reviewed under Problem List.  1) loss of consciousness  [name] is a pleasant 57y/o woman presenting for followup evaluation of episodes of loss of consciousness. She notes an increase in the frequency of these events in the past few weeks. This coincides with tapering off her xanax. She expresses concern these are seizure episodes as she notes generalized jerking during these events. Of note, she is sometimes able to recall her whole body jerking during these events. Recently evaluated by cardiology, found to have marked  fluctuations in BP and started on florinef. Continues to have difficulty with BP and HR fluctuations. Her physical exam is unremarkable. Unclear if these events a true seizure vs non-epileptic vs syncopal event. Will hold off on AED at this time. Refer to [facility] for ambulatory EEG monitoring. Patient counseled to avoid driving pending workup. Follow up once EEG monitoring completed.

## 2013-04-22 ENCOUNTER — Ambulatory Visit (HOSPITAL_BASED_OUTPATIENT_CLINIC_OR_DEPARTMENT_OTHER): Payer: Medicare Other | Admitting: Physical Medicine & Rehabilitation

## 2013-04-22 ENCOUNTER — Encounter: Payer: Self-pay | Admitting: Physical Medicine & Rehabilitation

## 2013-04-22 ENCOUNTER — Encounter: Payer: Medicare Other | Attending: Physical Medicine & Rehabilitation

## 2013-04-22 ENCOUNTER — Ambulatory Visit: Payer: Medicare Other | Admitting: Physical Medicine & Rehabilitation

## 2013-04-22 VITALS — BP 181/94 | HR 123 | Resp 16 | Ht 61.0 in | Wt 156.0 lb

## 2013-04-22 DIAGNOSIS — I2510 Atherosclerotic heart disease of native coronary artery without angina pectoris: Secondary | ICD-10-CM | POA: Diagnosis not present

## 2013-04-22 DIAGNOSIS — IMO0001 Reserved for inherently not codable concepts without codable children: Secondary | ICD-10-CM | POA: Diagnosis not present

## 2013-04-22 DIAGNOSIS — R209 Unspecified disturbances of skin sensation: Secondary | ICD-10-CM

## 2013-04-22 DIAGNOSIS — M810 Age-related osteoporosis without current pathological fracture: Secondary | ICD-10-CM | POA: Diagnosis not present

## 2013-04-22 DIAGNOSIS — M546 Pain in thoracic spine: Secondary | ICD-10-CM | POA: Diagnosis not present

## 2013-04-22 DIAGNOSIS — E785 Hyperlipidemia, unspecified: Secondary | ICD-10-CM | POA: Insufficient documentation

## 2013-04-22 DIAGNOSIS — K219 Gastro-esophageal reflux disease without esophagitis: Secondary | ICD-10-CM | POA: Insufficient documentation

## 2013-04-22 DIAGNOSIS — M961 Postlaminectomy syndrome, not elsewhere classified: Secondary | ICD-10-CM | POA: Insufficient documentation

## 2013-04-22 DIAGNOSIS — M069 Rheumatoid arthritis, unspecified: Secondary | ICD-10-CM | POA: Insufficient documentation

## 2013-04-22 DIAGNOSIS — G4730 Sleep apnea, unspecified: Secondary | ICD-10-CM | POA: Insufficient documentation

## 2013-04-22 DIAGNOSIS — R252 Cramp and spasm: Secondary | ICD-10-CM | POA: Insufficient documentation

## 2013-04-22 MED ORDER — BACLOFEN 10 MG PO TABS: 10.0000 mg | ORAL_TABLET | Freq: Three times a day (TID) | ORAL | Status: DC

## 2013-04-22 NOTE — Progress Notes (Signed)
Subjective:    Patient ID: [name], female    DOB: 04-14-1955, 58 y.o.   MRN: [id number]  HPI Swelling and weight gain as well as blurring of vision while taking Lyrica. Improved after discontinued. This was tried 03/04/2013 and discontinued about one month later. Has had black out spells and possible seizure. Cardiology thinks it's more neurologic. Has been seen by a neurologist this did coincide with Xanax withdrawal however will be getting EEG monitoring at [facility]  Patient also complains of spasms in the leg as well as some numbness. Feels like this was similar as before her surgery. Has not followed up yet with her spine surgeon.  No bowel or bladder complaints Pain Inventory Average Pain 10 Pain Right Now 10 My pain is tingling  In the last 24 hours, has pain interfered with the following? General activity 0 Relation with others 0 Enjoyment of life 0 What TIME of day is your pain at its worst? day, evening and night Sleep (in general) Fair  Pain is worse with: walking, bending, sitting, standing and some activites Pain improves with: injections Relief from Meds: n/a  Mobility use a cane how many minutes can you walk? 3 ability to climb steps?  yes do you drive?  no  Function I need assistance with the following:  household duties and shopping  Neuro/Psych weakness numbness tremor tingling trouble walking depression anxiety  Prior Studies Any changes since last visit?  no  Physicians involved in your care [facility], [facility], [facility]   Family History  Problem Relation Age of Onset  . Heart attack Father   . Cancer Father   . Mental illness Father   . Heart attack Brother     stents  . Cancer Mother   . Mental illness Mother    History   Social History  . Marital Status: Single    Spouse Name: N/A    Number of Children: N/A  . Years of Education: N/A   Social History Main Topics  . Smoking status: Never  Smoker   . Smokeless tobacco: Never Used  . Alcohol Use: No  . Drug Use: No  . Sexual Activity: None   Other Topics Concern  . None   Social History Narrative   Patient lives at home with her brother.   Patient is on disability.    Patient has 8th grade education.    Patient has 6 children, 20 grand-chilren, and 3 great-grand children.    Past Surgical History  Procedure Laterality Date  . Insertion of implatable loop recorder  08/11/2007    [name]  . Head up tilt table testing  06/15/2007    [name]  . Back surgery    . Doppler echocardiography  2009   Past Medical History  Diagnosis Date  . Anxiety   . Herpes simplex infection   . Osteoporosis   . Sleep apnea   . Hypertension   . Dyslipidemia   . Depression   . Asthma   . Fibromyalgia   . GERD (gastroesophageal reflux disease)   . Head injury, unspecified   . Supraventricular tachycardia   . Syncope and collapse     s/p ILR; no arhythmogenic cause identified  . Coronary artery disease     reported hx of "MI";  Echo 2009 with normal LVF;  Myoview 05/2011: no ischemia  . Spondylolisthesis   . Spinal stenosis of lumbar region   . Hyperlipidemia   . Bipolar 1  disorder   . Insomnia   . Arthritis     RHEUMATOID   BP 181/94  Pulse 123  Resp 16  Ht 5\' 1"  (1.549 m)  Wt 156 lb (70.761 kg)  BMI 29.49 kg/m2  SpO2 99%     Review of Systems  Constitutional: Positive for diaphoresis and unexpected weight change.  Respiratory: Positive for shortness of breath and wheezing.   Musculoskeletal: Positive for gait problem.  Neurological: Positive for tremors, weakness and numbness.       Tingling, spasm  Psychiatric/Behavioral: Positive for dysphoric mood. The patient is nervous/anxious.   All other systems reviewed and are negative.       Objective:   Physical Exam  Sensation reduced to pinprick both feet. Tone is normal in the lower extremities. No evidence of spasm.  Motor strength is with give  way weakness but at least 4/5 in bilateral hip flexors knee extensors ankle dorsiflexor plantar flexor  Deep tendon reflexes are normal in both lower extremities.  back has tenderness to palpation around the thoracic paraspinal area no tenderness at the lumbar paraspinal area. Negative straight leg raise testing      Assessment & Plan:   1. Lumbar postlaminectomy syndrome, patient with chronic muscle cramps but negative examination. Patient states she does not have good response to Zanaflex although at one point she did. We'll trial baclofen.  2. Thoracic paraspinal pain most likely myofascial, thoracic disc possible however no radiating symptoms. No signs of myelopathy  3. Paresthesias in the lower extremities. Unable to tolerate gabapentin or Lyrica She's complaints of multiple falls recently. Recommend followup with Dr. to see if any further spinal imaging as needed. She has seen a neurologist however they focused on possible seizures

## 2013-04-22 NOTE — Patient Instructions (Signed)
We are trying a different spasm medicine Didn't see any signs of muscle tone abnormalities. Stop zanaflex and start the baclofen  Since you have been following a lot and complained of strange sensations in the legs would recommend seeing your spine surgeon

## 2013-04-26 DIAGNOSIS — Z1231 Encounter for screening mammogram for malignant neoplasm of breast: Secondary | ICD-10-CM | POA: Diagnosis not present

## 2013-04-29 ENCOUNTER — Other Ambulatory Visit: Payer: Self-pay | Admitting: Nurse Practitioner

## 2013-05-02 ENCOUNTER — Telehealth: Payer: Self-pay | Admitting: Nurse Practitioner

## 2013-05-02 ENCOUNTER — Other Ambulatory Visit: Payer: Medicare Other

## 2013-05-02 ENCOUNTER — Other Ambulatory Visit: Payer: Self-pay

## 2013-05-02 DIAGNOSIS — K920 Hematemesis: Secondary | ICD-10-CM

## 2013-05-02 MED ORDER — PANTOPRAZOLE SODIUM 40 MG PO TBEC: 40.0000 mg | DELAYED_RELEASE_TABLET | Freq: Every day | ORAL | Status: DC

## 2013-05-02 NOTE — Telephone Encounter (Signed)
Please advise 

## 2013-05-02 NOTE — Telephone Encounter (Signed)
Patient wants referral to GI

## 2013-05-02 NOTE — Telephone Encounter (Signed)
No black means old blood - just watch

## 2013-05-03 NOTE — Telephone Encounter (Signed)
Referral made 

## 2013-05-06 DIAGNOSIS — F319 Bipolar disorder, unspecified: Secondary | ICD-10-CM | POA: Diagnosis not present

## 2013-05-06 DIAGNOSIS — N6019 Diffuse cystic mastopathy of unspecified breast: Secondary | ICD-10-CM | POA: Diagnosis not present

## 2013-05-06 DIAGNOSIS — J45909 Unspecified asthma, uncomplicated: Secondary | ICD-10-CM | POA: Diagnosis not present

## 2013-05-06 DIAGNOSIS — N390 Urinary tract infection, site not specified: Secondary | ICD-10-CM | POA: Diagnosis not present

## 2013-05-06 DIAGNOSIS — I10 Essential (primary) hypertension: Secondary | ICD-10-CM | POA: Diagnosis not present

## 2013-05-06 DIAGNOSIS — Z79899 Other long term (current) drug therapy: Secondary | ICD-10-CM | POA: Diagnosis not present

## 2013-05-06 DIAGNOSIS — Z950 Presence of cardiac pacemaker: Secondary | ICD-10-CM | POA: Diagnosis not present

## 2013-05-06 DIAGNOSIS — E7800 Pure hypercholesterolemia, unspecified: Secondary | ICD-10-CM | POA: Diagnosis not present

## 2013-05-06 DIAGNOSIS — L738 Other specified follicular disorders: Secondary | ICD-10-CM | POA: Diagnosis not present

## 2013-05-06 DIAGNOSIS — L678 Other hair color and hair shaft abnormalities: Secondary | ICD-10-CM | POA: Diagnosis not present

## 2013-05-06 DIAGNOSIS — M62838 Other muscle spasm: Secondary | ICD-10-CM | POA: Diagnosis not present

## 2013-05-06 DIAGNOSIS — I519 Heart disease, unspecified: Secondary | ICD-10-CM | POA: Diagnosis not present

## 2013-05-06 DIAGNOSIS — K219 Gastro-esophageal reflux disease without esophagitis: Secondary | ICD-10-CM | POA: Diagnosis not present

## 2013-05-10 DIAGNOSIS — G40219 Localization-related (focal) (partial) symptomatic epilepsy and epileptic syndromes with complex partial seizures, intractable, without status epilepticus: Secondary | ICD-10-CM | POA: Diagnosis not present

## 2013-05-11 ENCOUNTER — Telehealth: Payer: Self-pay | Admitting: Nurse Practitioner

## 2013-05-11 ENCOUNTER — Telehealth: Payer: Self-pay | Admitting: Internal Medicine

## 2013-05-11 DIAGNOSIS — R55 Syncope and collapse: Secondary | ICD-10-CM | POA: Diagnosis not present

## 2013-05-11 DIAGNOSIS — G40219 Localization-related (focal) (partial) symptomatic epilepsy and epileptic syndromes with complex partial seizures, intractable, without status epilepticus: Secondary | ICD-10-CM | POA: Diagnosis not present

## 2013-05-11 NOTE — Telephone Encounter (Signed)
Spoke with patient and let her know it was not me that called her

## 2013-05-11 NOTE — Telephone Encounter (Signed)
New message   Patient stating someone called her from Dr. Ladona Ridgel office . Returning call back to nurse.

## 2013-05-12 DIAGNOSIS — R55 Syncope and collapse: Secondary | ICD-10-CM | POA: Diagnosis not present

## 2013-05-12 DIAGNOSIS — G40219 Localization-related (focal) (partial) symptomatic epilepsy and epileptic syndromes with complex partial seizures, intractable, without status epilepticus: Secondary | ICD-10-CM | POA: Diagnosis not present

## 2013-05-16 ENCOUNTER — Encounter: Payer: Self-pay | Admitting: Internal Medicine

## 2013-05-17 ENCOUNTER — Ambulatory Visit: Payer: Medicare Other | Admitting: Internal Medicine

## 2013-05-30 ENCOUNTER — Ambulatory Visit: Payer: Medicare Other | Admitting: Physical Medicine & Rehabilitation

## 2013-05-31 ENCOUNTER — Telehealth: Payer: Self-pay | Admitting: Neurology

## 2013-05-31 NOTE — Telephone Encounter (Signed)
Called patient and left VM for clarification on recent tests, no recent test listed in epic for patient

## 2013-05-31 NOTE — Telephone Encounter (Signed)
Patient would like to know the results of her recent tests. Please call to advise. [phone]

## 2013-06-07 ENCOUNTER — Other Ambulatory Visit: Payer: Self-pay | Admitting: Nurse Practitioner

## 2013-06-07 NOTE — Telephone Encounter (Signed)
Please let her know that based on the monitoring the events are not seizures. Thanks.

## 2013-06-07 NOTE — Telephone Encounter (Signed)
Called patient to inform her that per Dr. Hosie Poisson, that based on teh monitoring the events are not seizures. Patient wanted to know what are the next steps, because the events are still happening. Please advise.

## 2013-06-07 NOTE — Telephone Encounter (Signed)
Report faxed from [facility], gave to Dr Hosie Poisson for his review

## 2013-06-07 NOTE — Telephone Encounter (Signed)
Please call her and schedule her for a follow up visit. Thanks

## 2013-06-07 NOTE — Telephone Encounter (Signed)
Spoke with patient and told her that we did not received the report from the epilepsy monitoring unit([place]), called and left VM message to fax report to our office.

## 2013-06-08 NOTE — Telephone Encounter (Signed)
Called patient to schedule an appt on 06/16/13 per Dr. Hosie Poisson. I advised the patient that if she has any other problems, questions or concerns to call the office. Patient verbalized understanding.

## 2013-06-09 ENCOUNTER — Other Ambulatory Visit: Payer: Self-pay | Admitting: *Deleted

## 2013-06-09 DIAGNOSIS — F333 Major depressive disorder, recurrent, severe with psychotic symptoms: Secondary | ICD-10-CM | POA: Diagnosis not present

## 2013-06-09 MED ORDER — BUTALBITAL-APAP-CAFFEINE 50-325-40 MG PO TABS: 1.0000 | ORAL_TABLET | Freq: Two times a day (BID) | ORAL | Status: DC | PRN

## 2013-06-09 NOTE — Telephone Encounter (Signed)
Please call in fierocet with 1 refills

## 2013-06-09 NOTE — Telephone Encounter (Signed)
Called into pharmacy

## 2013-06-09 NOTE — Telephone Encounter (Signed)
Last seen 03/07/13, last filled 04/04/13. Uses [facility]

## 2013-06-10 DIAGNOSIS — F4310 Post-traumatic stress disorder, unspecified: Secondary | ICD-10-CM | POA: Diagnosis not present

## 2013-06-10 DIAGNOSIS — R197 Diarrhea, unspecified: Secondary | ICD-10-CM | POA: Diagnosis not present

## 2013-06-10 DIAGNOSIS — Z9861 Coronary angioplasty status: Secondary | ICD-10-CM | POA: Diagnosis not present

## 2013-06-10 DIAGNOSIS — Z888 Allergy status to other drugs, medicaments and biological substances status: Secondary | ICD-10-CM | POA: Diagnosis not present

## 2013-06-10 DIAGNOSIS — J449 Chronic obstructive pulmonary disease, unspecified: Secondary | ICD-10-CM | POA: Diagnosis not present

## 2013-06-10 DIAGNOSIS — Z23 Encounter for immunization: Secondary | ICD-10-CM | POA: Diagnosis not present

## 2013-06-10 DIAGNOSIS — K219 Gastro-esophageal reflux disease without esophagitis: Secondary | ICD-10-CM | POA: Diagnosis not present

## 2013-06-10 DIAGNOSIS — I10 Essential (primary) hypertension: Secondary | ICD-10-CM | POA: Diagnosis not present

## 2013-06-10 DIAGNOSIS — G7109 Other specified muscular dystrophies: Secondary | ICD-10-CM | POA: Diagnosis not present

## 2013-06-10 DIAGNOSIS — N201 Calculus of ureter: Secondary | ICD-10-CM | POA: Diagnosis not present

## 2013-06-10 DIAGNOSIS — N23 Unspecified renal colic: Secondary | ICD-10-CM | POA: Diagnosis not present

## 2013-06-10 DIAGNOSIS — I252 Old myocardial infarction: Secondary | ICD-10-CM | POA: Diagnosis not present

## 2013-06-10 DIAGNOSIS — Z79899 Other long term (current) drug therapy: Secondary | ICD-10-CM | POA: Diagnosis not present

## 2013-06-10 DIAGNOSIS — Z885 Allergy status to narcotic agent status: Secondary | ICD-10-CM | POA: Diagnosis not present

## 2013-06-10 DIAGNOSIS — D649 Anemia, unspecified: Secondary | ICD-10-CM | POA: Diagnosis not present

## 2013-06-10 DIAGNOSIS — K5289 Other specified noninfective gastroenteritis and colitis: Secondary | ICD-10-CM | POA: Diagnosis not present

## 2013-06-10 DIAGNOSIS — F319 Bipolar disorder, unspecified: Secondary | ICD-10-CM | POA: Diagnosis not present

## 2013-06-10 DIAGNOSIS — K449 Diaphragmatic hernia without obstruction or gangrene: Secondary | ICD-10-CM | POA: Diagnosis not present

## 2013-06-10 DIAGNOSIS — E162 Hypoglycemia, unspecified: Secondary | ICD-10-CM | POA: Diagnosis not present

## 2013-06-10 DIAGNOSIS — G40909 Epilepsy, unspecified, not intractable, without status epilepticus: Secondary | ICD-10-CM | POA: Diagnosis not present

## 2013-06-10 DIAGNOSIS — Z95810 Presence of automatic (implantable) cardiac defibrillator: Secondary | ICD-10-CM | POA: Diagnosis not present

## 2013-06-10 DIAGNOSIS — R1032 Left lower quadrant pain: Secondary | ICD-10-CM | POA: Diagnosis not present

## 2013-06-11 DIAGNOSIS — N23 Unspecified renal colic: Secondary | ICD-10-CM | POA: Diagnosis not present

## 2013-06-11 DIAGNOSIS — Z23 Encounter for immunization: Secondary | ICD-10-CM | POA: Diagnosis not present

## 2013-06-11 DIAGNOSIS — R935 Abnormal findings on diagnostic imaging of other abdominal regions, including retroperitoneum: Secondary | ICD-10-CM | POA: Diagnosis not present

## 2013-06-11 DIAGNOSIS — E162 Hypoglycemia, unspecified: Secondary | ICD-10-CM | POA: Diagnosis not present

## 2013-06-11 DIAGNOSIS — N201 Calculus of ureter: Secondary | ICD-10-CM | POA: Diagnosis not present

## 2013-06-11 DIAGNOSIS — I10 Essential (primary) hypertension: Secondary | ICD-10-CM | POA: Diagnosis not present

## 2013-06-11 DIAGNOSIS — K449 Diaphragmatic hernia without obstruction or gangrene: Secondary | ICD-10-CM | POA: Diagnosis not present

## 2013-06-11 DIAGNOSIS — D649 Anemia, unspecified: Secondary | ICD-10-CM | POA: Diagnosis not present

## 2013-06-11 DIAGNOSIS — F319 Bipolar disorder, unspecified: Secondary | ICD-10-CM | POA: Diagnosis not present

## 2013-06-11 DIAGNOSIS — K5289 Other specified noninfective gastroenteritis and colitis: Secondary | ICD-10-CM | POA: Diagnosis not present

## 2013-06-12 DIAGNOSIS — N23 Unspecified renal colic: Secondary | ICD-10-CM | POA: Diagnosis not present

## 2013-06-12 DIAGNOSIS — N201 Calculus of ureter: Secondary | ICD-10-CM | POA: Diagnosis not present

## 2013-06-13 DIAGNOSIS — N23 Unspecified renal colic: Secondary | ICD-10-CM | POA: Diagnosis not present

## 2013-06-13 DIAGNOSIS — N201 Calculus of ureter: Secondary | ICD-10-CM | POA: Diagnosis not present

## 2013-06-16 ENCOUNTER — Encounter: Payer: Self-pay | Admitting: Neurology

## 2013-06-16 ENCOUNTER — Ambulatory Visit (INDEPENDENT_AMBULATORY_CARE_PROVIDER_SITE_OTHER): Payer: Medicare Other | Admitting: Neurology

## 2013-06-16 ENCOUNTER — Encounter (INDEPENDENT_AMBULATORY_CARE_PROVIDER_SITE_OTHER): Payer: Self-pay

## 2013-06-16 VITALS — BP 149/97 | HR 97 | Ht 61.0 in | Wt 159.0 lb

## 2013-06-16 DIAGNOSIS — R4182 Altered mental status, unspecified: Secondary | ICD-10-CM

## 2013-06-16 MED ORDER — LEVETIRACETAM 500 MG PO TABS: 500.0000 mg | ORAL_TABLET | Freq: Two times a day (BID) | ORAL | Status: DC

## 2013-06-16 NOTE — Progress Notes (Signed)
[facility]  Provider:  Dr Hosie Poisson Referring Provider: Ernestina Penna, MD Primary Care Physician:  Rudi Heap, MD  CC:  Loss of consciousness   HPI:  [name] is a 58 y.o. female here as a follow up from Dr. Christell Constant for episodes of loss of consciousness and concern of recurrent seizures. 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 She reports during the ambulatory monitoring she did not have any events (but report states there were multiple push button events). States she continues to have episodes of LOC, states she has been checking her blood pressure during these events and it has been recorded to be systolic under 70s during each event. She reports taking florinef. Notes when she blacks out she does not have any recollection of these events, states she is blacked out for a few minutes. Has head trauma from recent event. Unaware of surroundings during the event. No tongue biting, no loss of bowel/bladder.    Initial visit 12/28/2012 Having passing out episodes for years. Notes she has been having some jerks in her sleep and now during the daytime. Occurs when she blacks out, feels her whole body jerking, states there is nothing she can do about it. She reports it lasts for 5 to 10 minutes, she can remember the jerking but doesn't remember what people are saying around her. Recently evaluated by cardiology, had cardiac monitoring with noted fluctuations in BP from SBP 70s up to 170s. She reports having had a seizure while in her sleep study. She is unsure what the official report of sleep study showed. Denies any history of head trauma. No prior seizure history.   Sleep study from 10/2012 reviewed and results found to be unremarkable.   Cardiology note from 11/2012: Her blood work was essentially normal but her cardiac monitor did show excursions of her blood pressure,  predominantly low but at times high. Her blood pressures average 120 to 130 but at times dropped down into the 70s as well as up into the 170s. While she wore her monitor, she had no syncope.   Review of Systems: Out of a complete 14 system review, the patient complains of only the following symptoms, and all other reviewed systems are negative. Positive for memory loss headache seizure tremor test no joint pain joint swelling back pain aching muscles muscle cramps excessive thirst weight change facial swelling palpitations History   Social History  . Marital Status: Single    Spouse Name: N/A    Number of Children: N/A  . Years of Education: N/A   Occupational History  . Not on file.   Social History Main Topics  . Smoking status: Never Smoker   . Smokeless tobacco: Never Used  . Alcohol Use: No  . Drug Use: No  . Sexual Activity: Not on file   Other Topics Concern  . Not on file   Social History Narrative   Patient lives at home with her brother.   Patient is on disability.    Patient has 8th grade education.    Patient has 6 children, 20 grand-chilren, and 3 great-grand children.     Family History  Problem Relation Age of Onset  . Heart attack Father   . Cancer Father   . Mental illness Father   . Heart attack Brother     stents  . Cancer Mother   . Mental illness Mother  Past Medical History  Diagnosis Date  . Anxiety   . Herpes simplex infection   . Osteoporosis   . Sleep apnea   . Hypertension   . Dyslipidemia   . Depression   . Asthma   . Fibromyalgia   . GERD (gastroesophageal reflux disease)   . Head injury, unspecified   . Supraventricular tachycardia   . Syncope and collapse     s/p ILR; no arhythmogenic cause identified  . Coronary artery disease     reported hx of "MI";  Echo 2009 with normal LVF;  Myoview 05/2011: no ischemia  . Spondylolisthesis   . Spinal stenosis of lumbar region   . Hyperlipidemia   . Bipolar 1 disorder   .  Insomnia   . Arthritis     RHEUMATOID    Past Surgical History  Procedure Laterality Date  . Insertion of implatable loop recorder  08/11/2007    [name]  . Head up tilt table testing  06/15/2007    [name]  . Back surgery    . Doppler echocardiography  2009    Current Outpatient Prescriptions  Medication Sig Dispense Refill  . albuterol (PROVENTIL) (2.5 MG/3ML) 0.083% nebulizer solution Take 3 mLs (2.5 mg total) by nebulization every 6 (six) hours as needed for wheezing or shortness of breath.  150 mL  1  . atenolol (TENORMIN) 25 MG tablet Take 1 tablet (25 mg total) by mouth daily.      . baclofen (LIORESAL) 10 MG tablet Take 1 tablet (10 mg total) by mouth 3 (three) times daily.  90 each  1  . butalbital-acetaminophen-caffeine (FIORICET, ESGIC) 50-325-40 MG per tablet Take 1 tablet by mouth 2 (two) times daily as needed for headache.  60 tablet  0  . ciprofloxacin (CIPRO) 500 MG tablet       . CRESTOR 20 MG tablet TAKE (1/2) TABLET BY MOUTH DAILY.  15 tablet  0  . CYMBALTA 60 MG capsule Take 1 capsule by mouth at bedtime.      . diazepam (VALIUM) 10 MG tablet       . fluconazole (DIFLUCAN) 150 MG tablet Take 1 tablet (150 mg total) by mouth once.  3 tablet  1  . fludrocortisone (FLORINEF) 0.1 MG tablet Take 0.5 tablets (0.05 mg total) by mouth 2 (two) times daily.  30 tablet  6  . furosemide (LASIX) 40 MG tablet TAKE (1) TABLET TWICE DAILY.  60 tablet  0  . Lurasidone HCl (LATUDA) 20 MG TABS Take 20 mg by mouth.      . naproxen (NAPROSYN) 500 MG tablet TAKE 1 TABLET BY MOUTH TWICE DAILY WITH MEALS.  60 tablet  0  . nitroGLYCERIN (NITROLINGUAL) 0.4 MG/SPRAY spray USE (1) SPRAY UNDER TONGUE AS NEEDED FOR CHEST PAIN.  12 g  0  . nystatin cream (MYCOSTATIN) Apply 1 application topically 2 (two) times daily.  30 g  0  . omeprazole (PRILOSEC) 20 MG capsule       . omeprazole (PRILOSEC) 20 MG capsule TAKE (2) CAPSULES BY MOUTH ONCE DAILY.  60 capsule  0  .  oxyCODONE-acetaminophen (PERCOCET/ROXICET) 5-325 MG per tablet       . PROAIR HFA 108 (90 BASE) MCG/ACT inhaler INHALE 2 PUFFS EVERY 6 HOURS AS NEEDED.  8.5 g  0  . tiZANidine (ZANAFLEX) 4 MG tablet TAKE 1 TABLET BY MOUTH 3 TIMES DAILY.  90 tablet  1  . traZODone (DESYREL) 100 MG tablet Take 200 mg by mouth  daily.        . valACYclovir (VALTREX) 500 MG tablet Take 1 tablet (500 mg total) by mouth 2 (two) times daily.  60 tablet  5  . levETIRAcetam (KEPPRA) 500 MG tablet Take 1 tablet (500 mg total) by mouth 2 (two) times daily.  120 tablet  3   No current facility-administered medications for this visit.    Allergies as of 06/16/2013 - Review Complete 06/16/2013  Allergen Reaction Noted  . Codeine Other (See Comments)   . Morphine and related Other (See Comments) 08/12/2010  . Lyrica [pregabalin]  04/22/2013    Vitals: BP 149/97  Pulse 97  Ht 5\' 1"  (1.549 m)  Wt 159 lb (72.122 kg)  BMI 30.06 kg/m2 Last Weight:  Wt Readings from Last 1 Encounters:  06/16/13 159 lb (72.122 kg)   Last Height:   Ht Readings from Last 1 Encounters:  06/16/13 5\' 1"  (1.549 m)     Physical exam: Exam: Gen: NAD, conversant Eyes: anicteric sclerae, moist conjunctivae HENT: Atraumatic, oropharynx clear Neck: Trachea midline; supple,  Lungs: CTA, no wheezing, rales, rhonic                          CV: RRR, no MRG Abdomen: Soft, non-tender;  Extremities: No peripheral edema  Skin: Normal temperature, no rash,  Psych: Appropriate affect, pleasant  Neuro: MS: AA&Ox3, appropriately interactive, normal affect   Speech: fluent w/o paraphasic error  Memory: good recent and remote recall  CN: PERRL, EOMI no nystagmus, no ptosis, sensation intact to LT V1-V3 bilat, face symmetric, no weakness, hearing grossly intact, palate elevates symmetrically, shoulder shrug 5/5 bilat,  tongue protrudes midline, no fasiculations noted.  Motor: normal bulk and tone Strength: 5/5  In all extremities with  give-way weakness in bilat LE  Coord: rapid alternating and point-to-point (FNF, HTS) movements intact.  Reflexes: symmetrical, bilat downgoing toes  Sens: LT intact in all extremities  Gait: posture, stance, stride and arm-swing normal. Tandem gait intact. Able to walk on heels and toes. Romberg absent.   Assessment:  After physical and neurologic examination, review of laboratory studies, imaging, neurophysiology testing and pre-existing records, assessment will be reviewed on the problem list.  Plan:  Treatment plan and additional workup will be reviewed under Problem List.  1) loss of consciousness 2)Hypotension  Ms Kallis is a pleasant 57y/o woman presenting for followup evaluation of episodes of loss of consciousness. Since last visit she has had ambulatory EEG monitoring which was unremarkable, though she states she had no events during this monitoring. During recent event she suffered head trauma. Patient has tracked BP during the events and notes systolic BP <70s during the event. Unclear if events are syncopal event due to hypotension or marked hypotension represents a post ictal phenomenon. Due to severe nature of events will start patient on Keppra 500mg  BID, can titrate up as needed. Patient will follow up with cardiology for further insight into BP fluctuations. If events continue would consider 2nd opinion with [facility] department for question of seizure vs non-epileptic event.

## 2013-06-16 NOTE — Patient Instructions (Signed)
Overall you are doing fairly well but I do want to suggest a few things today:   Remember to drink plenty of fluid, eat healthy meals and do not skip any meals. Try to eat protein with a every meal and eat a healthy snack such as fruit or nuts in between meals. Try to keep a regular sleep-wake schedule and try to exercise daily, particularly in the form of walking, 20-30 minutes a day, if you can.   As far as your medications are concerned, I would like to suggest starting you on Keppra 500mg  twice a day. We can titrate this up as needed in the future.   Please follow up with cardiology to address blood pressure fluctuations. Follow up with as needed.  Please call us with any interim questions, concerns, problems, updates or refill requests.   Please also call us for any test results so we can go over those with you on the phone.  My clinical assistant and will answer any of your questions and relay your messages to me and also relay most of my messages to you.   Our phone number is [phone]. We also have an after hours call service for urgent matters and there is a physician on-call for urgent questions. For any emergencies you know to call 911 or go to the nearest emergency room

## 2013-06-17 ENCOUNTER — Ambulatory Visit: Payer: Medicare Other | Admitting: Gastroenterology

## 2013-06-17 ENCOUNTER — Encounter: Payer: Self-pay | Admitting: Gastroenterology

## 2013-06-17 DIAGNOSIS — Z9689 Presence of other specified functional implants: Secondary | ICD-10-CM | POA: Diagnosis not present

## 2013-06-17 DIAGNOSIS — K5900 Constipation, unspecified: Secondary | ICD-10-CM | POA: Diagnosis not present

## 2013-06-17 DIAGNOSIS — N201 Calculus of ureter: Secondary | ICD-10-CM | POA: Diagnosis not present

## 2013-06-17 DIAGNOSIS — Z9889 Other specified postprocedural states: Secondary | ICD-10-CM | POA: Diagnosis not present

## 2013-06-20 ENCOUNTER — Encounter: Payer: Self-pay | Admitting: Nurse Practitioner

## 2013-06-20 ENCOUNTER — Ambulatory Visit (INDEPENDENT_AMBULATORY_CARE_PROVIDER_SITE_OTHER): Payer: Medicare Other | Admitting: Nurse Practitioner

## 2013-06-20 VITALS — BP 175/98 | HR 75 | Temp 98.2°F | Ht 61.0 in | Wt 159.0 lb

## 2013-06-20 DIAGNOSIS — D649 Anemia, unspecified: Secondary | ICD-10-CM

## 2013-06-20 LAB — POCT HEMOGLOBIN: Hemoglobin: 14.4 g/dL (ref 12.2–16.2)

## 2013-06-20 NOTE — Progress Notes (Signed)
   Subjective:    Patient ID: [name], female    DOB: 1956-01-30, 58 y.o.   MRN: [id number]  [facility] and kidney stones- SHe was given 2 units of blood. SH eis here today for follow up of her anemia- SHe is due to have surgery on Kidney stones on Wednesday if can get approval from cardiologist by them.    Review of Systems  Constitutional: Negative.   HENT: Negative.   Respiratory: Negative.   Cardiovascular: Negative.   Genitourinary: Negative.   Musculoskeletal: Negative.   Neurological: Negative.   All other systems reviewed and are negative.       Objective:   Physical Exam  Constitutional: She is oriented to person, place, and time. She appears well-developed and well-nourished.  Cardiovascular: Normal rate, regular rhythm and normal heart sounds.   Pulmonary/Chest: Effort normal and breath sounds normal.  Abdominal: Soft. Bowel sounds are normal.  Neurological: She is alert and oriented to person, place, and time.  Skin: Skin is warm and dry.  Psychiatric: She has a normal mood and affect. Her behavior is normal. Judgment and thought content normal.   BP 175/98  Pulse 75  Temp(Src) 98.2 F (36.8 C) (Oral)  Ht 5\' 1"  (1.549 m)  Wt 159 lb (72.122 kg)  BMI 30.06 kg/m2  Results for orders placed in visit on 06/20/13  POCT HEMOGLOBIN      Result Value Ref Range   Hemoglobin 14.4  12.2 - 16.2 g/dL          Assessment & Plan:  .Anemia - Plan: POCT hemoglobin hospital records reviewed Needs to see cardiologist for surgery approval  [name] 06/22/13, FNP

## 2013-06-20 NOTE — Patient Instructions (Signed)

## 2013-06-22 DIAGNOSIS — J449 Chronic obstructive pulmonary disease, unspecified: Secondary | ICD-10-CM | POA: Diagnosis not present

## 2013-06-22 DIAGNOSIS — I2510 Atherosclerotic heart disease of native coronary artery without angina pectoris: Secondary | ICD-10-CM | POA: Diagnosis not present

## 2013-06-22 DIAGNOSIS — Z01818 Encounter for other preprocedural examination: Secondary | ICD-10-CM | POA: Diagnosis not present

## 2013-06-22 DIAGNOSIS — Z888 Allergy status to other drugs, medicaments and biological substances status: Secondary | ICD-10-CM | POA: Diagnosis not present

## 2013-06-22 DIAGNOSIS — G4730 Sleep apnea, unspecified: Secondary | ICD-10-CM | POA: Diagnosis not present

## 2013-06-22 DIAGNOSIS — Z950 Presence of cardiac pacemaker: Secondary | ICD-10-CM | POA: Diagnosis not present

## 2013-06-22 DIAGNOSIS — F319 Bipolar disorder, unspecified: Secondary | ICD-10-CM | POA: Diagnosis not present

## 2013-06-22 DIAGNOSIS — Z8249 Family history of ischemic heart disease and other diseases of the circulatory system: Secondary | ICD-10-CM | POA: Diagnosis not present

## 2013-06-22 DIAGNOSIS — Z79899 Other long term (current) drug therapy: Secondary | ICD-10-CM | POA: Diagnosis not present

## 2013-06-22 DIAGNOSIS — Z803 Family history of malignant neoplasm of breast: Secondary | ICD-10-CM | POA: Diagnosis not present

## 2013-06-22 DIAGNOSIS — Z885 Allergy status to narcotic agent status: Secondary | ICD-10-CM | POA: Diagnosis not present

## 2013-06-22 DIAGNOSIS — I10 Essential (primary) hypertension: Secondary | ICD-10-CM | POA: Diagnosis not present

## 2013-06-22 DIAGNOSIS — K5900 Constipation, unspecified: Secondary | ICD-10-CM | POA: Diagnosis not present

## 2013-06-22 DIAGNOSIS — Z9689 Presence of other specified functional implants: Secondary | ICD-10-CM | POA: Diagnosis not present

## 2013-06-22 DIAGNOSIS — Z538 Procedure and treatment not carried out for other reasons: Secondary | ICD-10-CM | POA: Diagnosis not present

## 2013-06-22 DIAGNOSIS — N201 Calculus of ureter: Secondary | ICD-10-CM | POA: Diagnosis not present

## 2013-06-22 DIAGNOSIS — I252 Old myocardial infarction: Secondary | ICD-10-CM | POA: Diagnosis not present

## 2013-06-22 DIAGNOSIS — Z87442 Personal history of urinary calculi: Secondary | ICD-10-CM | POA: Diagnosis not present

## 2013-06-23 DIAGNOSIS — N201 Calculus of ureter: Secondary | ICD-10-CM | POA: Diagnosis not present

## 2013-06-23 DIAGNOSIS — N135 Crossing vessel and stricture of ureter without hydronephrosis: Secondary | ICD-10-CM | POA: Diagnosis not present

## 2013-06-23 DIAGNOSIS — I252 Old myocardial infarction: Secondary | ICD-10-CM | POA: Diagnosis not present

## 2013-06-23 DIAGNOSIS — Z9689 Presence of other specified functional implants: Secondary | ICD-10-CM | POA: Diagnosis not present

## 2013-06-23 DIAGNOSIS — Z79899 Other long term (current) drug therapy: Secondary | ICD-10-CM | POA: Diagnosis not present

## 2013-06-23 DIAGNOSIS — Z885 Allergy status to narcotic agent status: Secondary | ICD-10-CM | POA: Diagnosis not present

## 2013-06-23 DIAGNOSIS — Z950 Presence of cardiac pacemaker: Secondary | ICD-10-CM | POA: Diagnosis not present

## 2013-06-23 DIAGNOSIS — N200 Calculus of kidney: Secondary | ICD-10-CM | POA: Diagnosis not present

## 2013-07-05 ENCOUNTER — Ambulatory Visit (INDEPENDENT_AMBULATORY_CARE_PROVIDER_SITE_OTHER): Payer: Medicare Other

## 2013-07-05 ENCOUNTER — Encounter: Payer: Self-pay | Admitting: General Practice

## 2013-07-05 ENCOUNTER — Ambulatory Visit (INDEPENDENT_AMBULATORY_CARE_PROVIDER_SITE_OTHER): Payer: Medicare Other | Admitting: General Practice

## 2013-07-05 VITALS — BP 132/89 | HR 77 | Temp 98.4°F | Wt 155.0 lb

## 2013-07-05 DIAGNOSIS — M25519 Pain in unspecified shoulder: Secondary | ICD-10-CM | POA: Diagnosis not present

## 2013-07-05 DIAGNOSIS — K219 Gastro-esophageal reflux disease without esophagitis: Secondary | ICD-10-CM

## 2013-07-05 MED ORDER — PANTOPRAZOLE SODIUM 40 MG PO TBEC: 40.0000 mg | DELAYED_RELEASE_TABLET | Freq: Every day | ORAL | Status: DC

## 2013-07-05 MED ORDER — UNIVERSAL ARM SLING MISC: 1.0000 | Freq: Once | Status: DC

## 2013-07-05 NOTE — Patient Instructions (Signed)
   Shoulder Pain The shoulder is the joint that connects your arms to your body. The bones that form the shoulder joint include the upper arm bone (humerus), the shoulder blade (scapula), and the collarbone (clavicle). The top of the humerus is shaped like a ball and fits into a rather flat socket on the scapula (glenoid cavity). A combination of muscles and strong, fibrous tissues that connect muscles to bones (tendons) support your shoulder joint and hold the ball in the socket. Small, fluid-filled sacs (bursae) are located in different areas of the joint. They act as cushions between the bones and the overlying soft tissues and help reduce friction between the gliding tendons and the bone as you move your arm. Your shoulder joint allows a wide range of motion in your arm. This range of motion allows you to do things like scratch your back or throw a ball. However, this range of motion also makes your shoulder more prone to pain from overuse and injury. Causes of shoulder pain can originate from both injury and overuse and usually can be grouped in the following four categories:  Redness, swelling, and pain (inflammation) of the tendon (tendinitis) or the bursae (bursitis).  Instability, such as a dislocation of the joint.  Inflammation of the joint (arthritis).  Broken bone (fracture). HOME CARE INSTRUCTIONS   Apply ice to the sore area.  Put ice in a plastic bag.  Place a towel between your skin and the bag.  Leave the ice on for 15-20 minutes, 03-04 times per day for the first 2 days.  Stop using cold packs if they do not help with the pain.  If you have a shoulder sling or immobilizer, wear it as long as your caregiver instructs. Only remove it to shower or bathe. Move your arm as little as possible, but keep your hand moving to prevent swelling.  Squeeze a soft ball or foam pad as much as possible to help prevent swelling.  Only take over-the-counter or prescription medicines for  pain, discomfort, or fever as directed by your caregiver. SEEK MEDICAL CARE IF:   Your shoulder pain increases, or new pain develops in your arm, hand, or fingers.  Your hand or fingers become cold and numb.  Your pain is not relieved with medicines. SEEK IMMEDIATE MEDICAL CARE IF:   Your arm, hand, or fingers are numb or tingling.  Your arm, hand, or fingers are significantly swollen or turn white or blue. MAKE SURE YOU:   Understand these instructions.  Will watch your condition.  Will get help right away if you are not doing well or get worse. Document Released: 12/25/2004 Document Revised: 12/10/2011 Document Reviewed: 03/01/2011 Exit[employer] Patient Information 2014 [employer].  

## 2013-07-05 NOTE — Progress Notes (Signed)
   Subjective:    Patient ID: [name], female    DOB: 06/02/1955, 58 y.o.   MRN: [id number]  Shoulder Pain  The pain is present in the right shoulder. This is a new problem. The current episode started in the past 7 days. There has been no history of extremity trauma. The problem occurs constantly. The quality of the pain is described as aching and sharp. The pain is at a severity of 7/10. The pain is moderate. Associated symptoms include a limited range of motion. Pertinent negatives include no fever, numbness, stiffness or tingling. She has tried heat, cold, rest and oral narcotics for the symptoms. Family history does not include gout. Her past medical history is significant for osteoarthritis.      Review of Systems  Constitutional: Negative for fever and chills.  Respiratory: Negative for chest tightness and shortness of breath.   Cardiovascular: Negative for chest pain and palpitations.  Musculoskeletal: Negative for stiffness.       Right shoulder pain  Neurological: Negative for tingling and numbness.       Objective:   Physical Exam  Constitutional: She is oriented to person, place, and time. She appears well-developed and well-nourished.  Pulmonary/Chest: Effort normal and breath sounds normal. No respiratory distress. She exhibits no tenderness.  Musculoskeletal: She exhibits tenderness.  Right shoulder pain with palpation. Grimacing with lifting beyond 45 degree. Negative erythema or edema.   Neurological: She is alert and oriented to person, place, and time.  Skin: Skin is warm and dry.  Psychiatric: She has a normal mood and affect.     [facility] reading (PRIMARY) by Erby Pian, FNP-C, no fracture or dislocation.      Assessment & Plan:  1. Pain in joint, shoulder region - DG Shoulder Right; Future - Elastic Bandages & Supports (UNIVERSAL ARM SLING) MISC; 1 kit by Does not apply route once.  Dispense: 1 each; Refill: 0 - Ambulatory referral to  Orthopedic Surgery  2. GERD (gastroesophageal reflux disease)  - pantoprazole (PROTONIX) 40 MG tablet; Take 1 tablet (40 mg total) by mouth daily.  Dispense: 30 tablet; Refill: 5

## 2013-07-07 DIAGNOSIS — M7530 Calcific tendinitis of unspecified shoulder: Secondary | ICD-10-CM | POA: Diagnosis not present

## 2013-07-07 DIAGNOSIS — M25519 Pain in unspecified shoulder: Secondary | ICD-10-CM | POA: Diagnosis not present

## 2013-07-14 ENCOUNTER — Other Ambulatory Visit: Payer: Self-pay | Admitting: Nurse Practitioner

## 2013-07-14 ENCOUNTER — Ambulatory Visit: Payer: Medicare Other | Admitting: Physical Medicine and Rehabilitation

## 2013-07-14 ENCOUNTER — Encounter: Payer: Medicare Other | Admitting: Registered Nurse

## 2013-07-15 NOTE — Telephone Encounter (Signed)
rx ready for pickup 

## 2013-07-16 ENCOUNTER — Other Ambulatory Visit: Payer: Self-pay | Admitting: Nurse Practitioner

## 2013-07-18 NOTE — Telephone Encounter (Signed)
Patient aware to pick up 

## 2013-07-19 ENCOUNTER — Other Ambulatory Visit: Payer: Self-pay

## 2013-07-19 NOTE — Telephone Encounter (Signed)
Last seen 07/05/13  Mae  Print and route to nurse

## 2013-07-20 DIAGNOSIS — F333 Major depressive disorder, recurrent, severe with psychotic symptoms: Secondary | ICD-10-CM | POA: Diagnosis not present

## 2013-07-21 ENCOUNTER — Encounter: Payer: Medicare Other | Attending: Physical Medicine & Rehabilitation | Admitting: Registered Nurse

## 2013-07-21 ENCOUNTER — Encounter: Payer: Self-pay | Admitting: Registered Nurse

## 2013-07-21 VITALS — BP 144/83 | HR 61 | Resp 14 | Ht 61.0 in | Wt 155.0 lb

## 2013-07-21 DIAGNOSIS — IMO0001 Reserved for inherently not codable concepts without codable children: Secondary | ICD-10-CM

## 2013-07-21 DIAGNOSIS — R209 Unspecified disturbances of skin sensation: Secondary | ICD-10-CM | POA: Diagnosis not present

## 2013-07-21 DIAGNOSIS — I2510 Atherosclerotic heart disease of native coronary artery without angina pectoris: Secondary | ICD-10-CM | POA: Diagnosis not present

## 2013-07-21 DIAGNOSIS — M25519 Pain in unspecified shoulder: Secondary | ICD-10-CM | POA: Diagnosis not present

## 2013-07-21 DIAGNOSIS — M961 Postlaminectomy syndrome, not elsewhere classified: Secondary | ICD-10-CM | POA: Diagnosis not present

## 2013-07-21 DIAGNOSIS — Z5181 Encounter for therapeutic drug level monitoring: Secondary | ICD-10-CM | POA: Diagnosis not present

## 2013-07-21 DIAGNOSIS — M810 Age-related osteoporosis without current pathological fracture: Secondary | ICD-10-CM | POA: Insufficient documentation

## 2013-07-21 DIAGNOSIS — R252 Cramp and spasm: Secondary | ICD-10-CM | POA: Diagnosis not present

## 2013-07-21 DIAGNOSIS — K219 Gastro-esophageal reflux disease without esophagitis: Secondary | ICD-10-CM | POA: Diagnosis not present

## 2013-07-21 DIAGNOSIS — M069 Rheumatoid arthritis, unspecified: Secondary | ICD-10-CM | POA: Diagnosis not present

## 2013-07-21 DIAGNOSIS — M546 Pain in thoracic spine: Secondary | ICD-10-CM | POA: Insufficient documentation

## 2013-07-21 DIAGNOSIS — G4730 Sleep apnea, unspecified: Secondary | ICD-10-CM | POA: Insufficient documentation

## 2013-07-21 DIAGNOSIS — E785 Hyperlipidemia, unspecified: Secondary | ICD-10-CM | POA: Insufficient documentation

## 2013-07-21 DIAGNOSIS — Z79899 Other long term (current) drug therapy: Secondary | ICD-10-CM

## 2013-07-21 DIAGNOSIS — M7530 Calcific tendinitis of unspecified shoulder: Secondary | ICD-10-CM | POA: Diagnosis not present

## 2013-07-21 MED ORDER — BACLOFEN 10 MG PO TABS: 10.0000 mg | ORAL_TABLET | Freq: Three times a day (TID) | ORAL | Status: DC

## 2013-07-21 MED ORDER — BUTALBITAL-APAP-CAFFEINE 50-325-40 MG PO TABS: ORAL_TABLET | ORAL | Status: DC

## 2013-07-21 NOTE — Progress Notes (Signed)
Subjective:    Patient ID: [name], female    DOB: Aug 03, 1955, 58 y.o.   MRN: [id number]  HPI: [name] is a 58 year old female who returns for follow up for chronic pain.Her surgical history included the following she has lumbosacral spondylolisthesis at L4-L5 level. She underwent surgery in March of 2010. She has good healing in the fusion site. She does have degenerative changes above the fusion at L2-L3 and L3-L4. She went to [facility] in January due to the muscle spasms intensity, they told her she was dehydrated. She was given IVF's and was instructed to drink powerade and the spasms went away. She says she had three kidney surgeries in the last few months for kidney stones. While being hospitalized her shoulder either became dislocated or frozen. Her PMD sent her to see  orthopedist Dr. Case,  her right arm is in a sling, and with movement very painful. She also mentions she is seeing a neurologist for frequent black outs (seizures), they feel it's related to her low blood pressure per patient. She will be following up with her neurologist and cardiologist.  On 07/20/2013 she fell outside of [facility] she hit the railing and landed on her right hip, no ecchymosis noted.She admits to having her cane she felt dizzy and black-out. She was encouraged to call her neurologist and cardiologist and keep people around her at all times. She admits she has support at all times.  She was on Percocet from another physician for a short while and she's requesting if she could have a script for percocet. She has an appointment with Dr. Case today , I spoke to her let's see what Dr. Case findings are regarding her shoulder if he feels there is a need to prescribe narcotics he will do so she agrees and verbalizes understanding. She walks with a cane, and arises from chair with ease.    Pain Inventory Average Pain 8 Pain Right Now 8 My pain is sharp, stabbing,  tingling and aching  In the last 24 hours, has pain interfered with the following? General activity 0 Relation with others 0 Enjoyment of life 0 What TIME of day is your pain at its worst? constant Sleep (in general) Fair  Pain is worse with: walking, bending, sitting, inactivity and standing Pain improves with: medication and injections Relief from Meds: n/a  Mobility use a cane how many minutes can you walk? 30  Function disabled: date disabled .  Neuro/Psych bladder control problems bowel control problems weakness numbness tingling trouble walking spasms depression anxiety  Prior Studies Any changes since last visit?  yes  Physicians involved in your care Any changes since last visit?  no   Family History  Problem Relation Age of Onset  . Heart attack Father   . Cancer Father   . Mental illness Father   . Heart attack Brother     stents  . Cancer Mother   . Mental illness Mother    History   Social History  . Marital Status: Single    Spouse Name: N/A    Number of Children: N/A  . Years of Education: N/A   Social History Main Topics  . Smoking status: Never Smoker   . Smokeless tobacco: Never Used  . Alcohol Use: No  . Drug Use: No  . Sexual Activity: None   Other Topics Concern  . None   Social History Narrative   Patient lives at home with  her brother.   Patient is on disability.    Patient has 8th grade education.    Patient has 6 children, 20 grand-chilren, and 3 great-grand children.    Past Surgical History  Procedure Laterality Date  . Insertion of implatable loop recorder  08/11/2007    [name]  . Head up tilt table testing  06/15/2007    [name]  . Back surgery    . Doppler echocardiography  2009   Past Medical History  Diagnosis Date  . Anxiety   . Herpes simplex infection   . Osteoporosis   . Sleep apnea   . Hypertension   . Dyslipidemia   . Depression   . Asthma   . Fibromyalgia   . GERD  (gastroesophageal reflux disease)   . Head injury, unspecified   . Supraventricular tachycardia   . Syncope and collapse     s/p ILR; no arhythmogenic cause identified  . Coronary artery disease     reported hx of "MI";  Echo 2009 with normal LVF;  Myoview 05/2011: no ischemia  . Spondylolisthesis   . Spinal stenosis of lumbar region   . Hyperlipidemia   . Bipolar 1 disorder   . Insomnia   . Arthritis     RHEUMATOID   BP 144/83  Pulse 61  Resp 14  Ht 5\' 1"  (1.549 m)  Wt 155 lb (70.308 kg)  BMI 29.30 kg/m2  SpO2 98%  Opioid Risk Score:   Fall Risk Score: Moderate Fall Risk (6-13 points) (patient educated handout declined)   Review of Systems  Constitutional: Positive for unexpected weight change.  Respiratory: Positive for cough, shortness of breath and wheezing.   Gastrointestinal: Positive for constipation.  Genitourinary: Positive for difficulty urinating.  Neurological: Positive for numbness.  Psychiatric/Behavioral: Positive for dysphoric mood. The patient is nervous/anxious.   All other systems reviewed and are negative.      Objective:   Physical Exam  Nursing note and vitals reviewed. Constitutional: She is oriented to person, place, and time. She appears well-developed and well-nourished.  HENT:  Head: Normocephalic.  Neck: Normal range of motion. Neck supple.  Cervical Paraspinals Tenderness : C-3-C-5  Cardiovascular: Normal rate, regular rhythm and normal heart sounds.   Pulmonary/Chest: Effort normal.  Musculoskeletal:  Normal Muscle Bulk: Muscle testing reveals: Right Hand Grip 4/5 Left Hand 5/5 Right Shoulder in sling/ unable to assess due to pain. Right Hip Tenderness: Post fall/ No ecchymosis noted Thorac paraspinal Tenderness: T-2-T4 Lower Extremities Right Lower Extremity with decreased ROM radiates pain to right hip. Left Lower extremity: Full ROM  Neurological: She is alert and oriented to person, place, and time.  Skin: Skin is warm and  dry.  Psychiatric: She has a normal mood and affect.          Assessment & Plan:  1. Lumbar postlaminectomy syndrome, patient with chronic muscle cramps.Was hospitalized she states" the spasms was related to dehydration" has improved. We'll continue baclofen.  2. Thoracic paraspinal pain most likely myofascial, thoracic disc: Continue to monitor. Encourage heat therapy and exercise as tolerated. 3. Paresthesias in the lower extremities. Neurologist Following  F/U in 2 months  30 minutes of face to face patient care time was spent during this visit. All questions were encouraged and answered.

## 2013-07-25 DIAGNOSIS — M25519 Pain in unspecified shoulder: Secondary | ICD-10-CM | POA: Diagnosis not present

## 2013-07-25 DIAGNOSIS — M6281 Muscle weakness (generalized): Secondary | ICD-10-CM | POA: Diagnosis not present

## 2013-07-25 DIAGNOSIS — IMO0001 Reserved for inherently not codable concepts without codable children: Secondary | ICD-10-CM | POA: Diagnosis not present

## 2013-07-27 ENCOUNTER — Ambulatory Visit (INDEPENDENT_AMBULATORY_CARE_PROVIDER_SITE_OTHER): Payer: Medicare Other | Admitting: Family

## 2013-07-27 ENCOUNTER — Encounter: Payer: Self-pay | Admitting: Family

## 2013-07-27 VITALS — BP 126/83 | HR 112 | Temp 98.4°F | Ht 61.0 in | Wt 152.4 lb

## 2013-07-27 DIAGNOSIS — J029 Acute pharyngitis, unspecified: Secondary | ICD-10-CM

## 2013-07-27 DIAGNOSIS — R52 Pain, unspecified: Secondary | ICD-10-CM

## 2013-07-27 DIAGNOSIS — N390 Urinary tract infection, site not specified: Secondary | ICD-10-CM | POA: Diagnosis not present

## 2013-07-27 LAB — POCT URINALYSIS DIPSTICK
Bilirubin, UA: NEGATIVE
Glucose, UA: NEGATIVE
Ketones, UA: NEGATIVE
NITRITE UA: NEGATIVE
PH UA: 6
RBC UA: NEGATIVE
Spec Grav, UA: >=1.03
Urobilinogen, UA: NEGATIVE

## 2013-07-27 LAB — POCT UA - MICROSCOPIC ONLY
CASTS, UR, LPF, POC: NEGATIVE
CRYSTALS, UR, HPF, POC: NEGATIVE
MUCUS UA: NEGATIVE
Yeast, UA: NEGATIVE

## 2013-07-27 LAB — POCT RAPID STREP A (OFFICE): Rapid Strep A Screen: NEGATIVE

## 2013-07-27 MED ORDER — NITROFURANTOIN MONOHYD MACRO 100 MG PO CAPS: 100.0000 mg | ORAL_CAPSULE | Freq: Two times a day (BID) | ORAL | Status: DC

## 2013-07-27 MED ORDER — AMOXICILLIN 500 MG PO CAPS: 500.0000 mg | ORAL_CAPSULE | Freq: Two times a day (BID) | ORAL | Status: DC

## 2013-07-27 NOTE — Progress Notes (Signed)
Subjective:    Patient ID: [name], female    DOB: 1955-11-22, 58 y.o.   MRN: [id number]  Sore Throat  This is a new problem. The current episode started in the past 7 days. The problem has been gradually worsening. There has been no fever. The pain is at a severity of 7/10. The pain is mild. Associated symptoms include diarrhea, swollen glands and trouble swallowing. Pertinent negatives include no congestion, coughing, ear pain or vomiting. She has had exposure to strep. She has tried gargles for the symptoms. The treatment provided no relief.  Urinary Tract Infection  This is a new problem. The current episode started in the past 7 days. The problem occurs intermittently. The problem has been gradually worsening. The quality of the pain is described as aching (itching). The pain is at a severity of 3/10. The pain is mild. There has been no fever. She is sexually active. Associated symptoms include flank pain, hesitancy and urgency. Pertinent negatives include no chills, discharge or vomiting. She has tried nothing for the symptoms.      Review of Systems  Constitutional: Negative for chills.  HENT: Positive for trouble swallowing. Negative for congestion and ear pain.   Respiratory: Negative.  Negative for cough.   Cardiovascular: Negative.   Gastrointestinal: Positive for diarrhea. Negative for vomiting.  Endocrine: Negative.   Genitourinary: Positive for hesitancy, urgency and flank pain.  All other systems reviewed and are negative.      Objective:   Physical Exam  Vitals reviewed. Constitutional: She is oriented to person, place, and time. She appears well-developed and well-nourished. No distress.  HENT:  Head: Normocephalic and atraumatic.  Right Ear: External ear normal.  Left Ear: External ear normal.  Mouth/Throat: Posterior oropharyngeal erythema present.  Cervical lymph nodes enlarged and tender bilaterally   Eyes: Pupils are equal, round, and  reactive to light.  Neck: Normal range of motion. Neck supple. No thyromegaly present.  Cardiovascular: Normal rate, regular rhythm, normal heart sounds and intact distal pulses.   No murmur heard. Pulmonary/Chest: Effort normal and breath sounds normal. No respiratory distress. She has no wheezes.  Abdominal: Soft. Bowel sounds are normal. She exhibits no distension. There is tenderness (mild tenderness in lower abd).  Musculoskeletal: Normal range of motion. She exhibits no edema and no tenderness.  Neurological: She is alert and oriented to person, place, and time. She has normal reflexes. No cranial nerve deficit.  Skin: Skin is warm and dry.  Psychiatric: She has a normal mood and affect. Her behavior is normal. Judgment and thought content normal.    BP 126/83  Pulse 112  Temp(Src) 98.4 F (36.9 C) (Oral)  Ht 5\' 1"  (1.549 m)  Wt 152 lb 6.4 oz (69.128 kg)  BMI 28.81 kg/m2   Results for orders placed in visit on 07/27/13  POCT URINALYSIS DIPSTICK      Result Value Ref Range   Color, UA gold     Clarity, UA clear     Glucose, UA negative     Bilirubin, UA negative     Ketones, UA negative     Spec Grav, UA >=1.030     Blood, UA negative     pH, UA 6.0     Protein, UA trace     Urobilinogen, UA negative     Nitrite, UA negative     Leukocytes, UA Trace    POCT UA - MICROSCOPIC ONLY      Result Value Ref Range  WBC, Ur, HPF, POC 5-10     RBC, urine, microscopic 1-3     Bacteria, U Microscopic moderate     Mucus, UA negative     Epithelial cells, urine per micros moderate     Crystals, Ur, HPF, POC negative     Casts, Ur, LPF, POC negative     Yeast, UA negative    POCT RAPID STREP A (OFFICE)      Result Value Ref Range   Rapid Strep A Screen Negative  Negative        Assessment & Plan:  1. Pain - POCT urinalysis dipstick - POCT UA - Microscopic Only - POCT rapid strep A  2. Acute pharyngitis - Take meds as prescribed - Use a cool mist humidifier  -Force  fluids -For fever or aces or pains- take tylenol or ibuprofen appropriate for age and weight.  * for fevers greater than 101 orally you may alternate ibuprofen and tylenol every  3 hours. -Throat lozenges if help -New toothbrush in 3 days - amoxicillin (AMOXIL) 500 MG capsule; Take 1 capsule (500 mg total) by mouth 2 (two) times daily.  Dispense: 20 capsule; Refill: 0  3. UTI (urinary tract infection) Force fluids AZO over the counter X2 days RTO prn Culture pending - nitrofurantoin, macrocrystal-monohydrate, (MACROBID) 100 MG capsule; Take 1 capsule (100 mg total) by mouth 2 (two) times daily.  Dispense: 10 capsule; Refill: 0  Jannifer Rodney, FNP

## 2013-07-27 NOTE — Patient Instructions (Addendum)
Pharyngitis Pharyngitis is redness, pain, and swelling (inflammation) of your pharynx.  CAUSES  Pharyngitis is usually caused by infection. Most of the time, these infections are from viruses (viral) and are part of a cold. However, sometimes pharyngitis is caused by bacteria (bacterial). Pharyngitis can also be caused by allergies. Viral pharyngitis may be spread from person to person by coughing, sneezing, and personal items or utensils (cups, forks, spoons, toothbrushes). Bacterial pharyngitis may be spread from person to person by more intimate contact, such as kissing.  SIGNS AND SYMPTOMS  Symptoms of pharyngitis include:   Sore throat.   Tiredness (fatigue).   Low-grade fever.   Headache.  Joint pain and muscle aches.  Skin rashes.  Swollen lymph nodes.  Plaque-like film on throat or tonsils (often seen with bacterial pharyngitis). DIAGNOSIS  Your health care provider will ask you questions about your illness and your symptoms. Your medical history, along with a physical exam, is often all that is needed to diagnose pharyngitis. Sometimes, a rapid strep test is done. Other lab tests may also be done, depending on the suspected cause.  TREATMENT  Viral pharyngitis will usually get better in 3 4 days without the use of medicine. Bacterial pharyngitis is treated with medicines that kill germs (antibiotics).  HOME CARE INSTRUCTIONS   Drink enough water and fluids to keep your urine clear or pale yellow.   Only take over-the-counter or prescription medicines as directed by your health care provider:   If you are prescribed antibiotics, make sure you finish them even if you start to feel better.   Do not take aspirin.   Get lots of rest.   Gargle with 8 oz of salt water ( tsp of salt per 1 qt of water) as often as every 1 2 hours to soothe your throat.   Throat lozenges (if you are not at risk for choking) or sprays may be used to soothe your throat. SEEK MEDICAL  CARE IF:   You have large, tender lumps in your neck.  You have a rash.  You cough up green, yellow-brown, or bloody spit. SEEK IMMEDIATE MEDICAL CARE IF:   Your neck becomes stiff.  You drool or are unable to swallow liquids.  You vomit or are unable to keep medicines or liquids down.  You have severe pain that does not go away with the use of recommended medicines.  You have trouble breathing (not caused by a stuffy nose). MAKE SURE YOU:   Understand these instructions.  Will watch your condition.  Will get help right away if you are not doing well or get worse. Document Released: 03/17/2005 Document Revised: 01/05/2013 Document Reviewed: 11/22/2012 [facility] Patient Information 2014 [place], [place].  Urinary Tract Infection Urinary tract infections (UTIs) can develop anywhere along your urinary tract. Your urinary tract is your body's drainage system for removing wastes and extra water. Your urinary tract includes two kidneys, two ureters, a bladder, and a urethra. Your kidneys are a pair of bean-shaped organs. Each kidney is about the size of your fist. They are located below your ribs, one on each side of your spine. CAUSES Infections are caused by microbes, which are microscopic organisms, including fungi, viruses, and bacteria. These organisms are so small that they can only be seen through a microscope. Bacteria are the microbes that most commonly cause UTIs. SYMPTOMS  Symptoms of UTIs may vary by age and gender of the patient and by the location of the infection. Symptoms in young women typically include  a frequent and intense urge to urinate and a painful, burning feeling in the bladder or urethra during urination. Older women and men are more likely to be tired, shaky, and weak and have muscle aches and abdominal pain. A fever may mean the infection is in your kidneys. Other symptoms of a kidney infection include pain in your back or sides below the ribs, nausea, and  vomiting. DIAGNOSIS To diagnose a UTI, your caregiver will ask you about your symptoms. Your caregiver also will ask to provide a urine sample. The urine sample will be tested for bacteria and white blood cells. White blood cells are made by your body to help fight infection. TREATMENT  Typically, UTIs can be treated with medication. Because most UTIs are caused by a bacterial infection, they usually can be treated with the use of antibiotics. The choice of antibiotic and length of treatment depend on your symptoms and the type of bacteria causing your infection. HOME CARE INSTRUCTIONS  If you were prescribed antibiotics, take them exactly as your caregiver instructs you. Finish the medication even if you feel better after you have only taken some of the medication.  Drink enough water and fluids to keep your urine clear or pale yellow.  Avoid caffeine, tea, and carbonated beverages. They tend to irritate your bladder.  Empty your bladder often. Avoid holding urine for long periods of time.  Empty your bladder before and after sexual intercourse.  After a bowel movement, women should cleanse from front to back. Use each tissue only once. SEEK MEDICAL CARE IF:   You have back pain.  You develop a fever.  Your symptoms do not begin to resolve within 3 days. SEEK IMMEDIATE MEDICAL CARE IF:   You have severe back pain or lower abdominal pain.  You develop chills.  You have nausea or vomiting.  You have continued burning or discomfort with urination. MAKE SURE YOU:   Understand these instructions.  Will watch your condition.  Will get help right away if you are not doing well or get worse. Document Released: 12/25/2004 Document Revised: 09/16/2011 Document Reviewed: 04/25/2011 [facility] Patient Information 2014 [place], [place].

## 2013-08-03 ENCOUNTER — Other Ambulatory Visit: Payer: Self-pay | Admitting: Nurse Practitioner

## 2013-08-05 ENCOUNTER — Encounter: Payer: Self-pay | Admitting: Gastroenterology

## 2013-08-05 ENCOUNTER — Ambulatory Visit (INDEPENDENT_AMBULATORY_CARE_PROVIDER_SITE_OTHER): Payer: Medicare Other | Admitting: Gastroenterology

## 2013-08-05 VITALS — BP 122/68 | HR 68 | Ht 61.0 in | Wt 154.2 lb

## 2013-08-05 DIAGNOSIS — R1310 Dysphagia, unspecified: Secondary | ICD-10-CM | POA: Diagnosis not present

## 2013-08-05 DIAGNOSIS — K219 Gastro-esophageal reflux disease without esophagitis: Secondary | ICD-10-CM | POA: Diagnosis not present

## 2013-08-05 DIAGNOSIS — M25519 Pain in unspecified shoulder: Secondary | ICD-10-CM | POA: Diagnosis not present

## 2013-08-05 DIAGNOSIS — M6281 Muscle weakness (generalized): Secondary | ICD-10-CM | POA: Diagnosis not present

## 2013-08-05 DIAGNOSIS — IMO0001 Reserved for inherently not codable concepts without codable children: Secondary | ICD-10-CM | POA: Diagnosis not present

## 2013-08-05 NOTE — Assessment & Plan Note (Signed)
Rule out peptic esophageal stricture an opportunistic infection the esophagus  Recommendations #1 upper endoscopy with dilatation as indicated

## 2013-08-05 NOTE — Progress Notes (Signed)
_                                                                                                                History of Present Illness: 58 year old white female with medical problems including coronary artery disease, sleep apnea, rheumatoid arthritis, referred for evaluation of reflux.  For several years she's been complaining of severe pyrosis.  This occurs throughout the day and night years she frequently regurgitates acidic gastric contents.  She complains of dysphagia to solids.  She's curtailed her diet and has been taking Protonix along with over-the-counter medicines, without relief.  She's on no gastric irritants including nonsteroidals.  She underwent colonoscopy within the last 2 years which she claims was normal.    Past Medical History  Diagnosis Date  . Anxiety   . Herpes simplex infection   . Osteoporosis   . Sleep apnea   . Hypertension   . Dyslipidemia   . Depression   . Asthma   . Fibromyalgia   . GERD (gastroesophageal reflux disease)   . Head injury, unspecified   . Supraventricular tachycardia   . Syncope and collapse     s/p ILR; no arhythmogenic cause identified  . Coronary artery disease     reported hx of "MI";  Echo 2009 with normal LVF;  Myoview 05/2011: no ischemia  . Spondylolisthesis   . Spinal stenosis of lumbar region   . Hyperlipidemia   . Bipolar 1 disorder   . Insomnia   . Arthritis     RHEUMATOID   Past Surgical History  Procedure Laterality Date  . Insertion of implatable loop recorder  08/11/2007    [name]  . Head up tilt table testing  06/15/2007    [name]  . Back surgery    . Doppler echocardiography  2009   family history includes Cancer in her father and mother; Heart attack in her brother and father; Mental illness in her father and mother. Current Outpatient Prescriptions  Medication Sig Dispense Refill  . acyclovir ointment (ZOVIRAX) 5 % Apply 1 application topically every 3 (three) hours.        [name] albuterol (PROVENTIL) (2.5 MG/3ML) 0.083% nebulizer solution Take 3 mLs (2.5 mg total) by nebulization every 6 (six) hours as needed for wheezing or shortness of breath.  150 mL  1  . atenolol (TENORMIN) 25 MG tablet Take 1 tablet (25 mg total) by mouth daily.      . baclofen (LIORESAL) 10 MG tablet Take 1 tablet (10 mg total) by mouth 3 (three) times daily.  90 each  2  . butalbital-acetaminophen-caffeine (FIORICET, ESGIC) 50-325-40 MG per tablet Take 1 tablet twice daily as needed for tension headache  60 tablet  0  . CRESTOR 20 MG tablet TAKE (1/2) TABLET BY MOUTH DAILY.  15 tablet  0  . CYMBALTA 60 MG capsule Take 1 capsule by mouth at bedtime.      . diazepam (VALIUM) 10 MG tablet       . Elastic  Bandages & Supports (UNIVERSAL ARM SLING) MISC 1 kit by Does not apply route once.  1 each  0  . fludrocortisone (FLORINEF) 0.1 MG tablet       . haloperidol (HALDOL) 0.5 MG tablet       . levETIRAcetam (KEPPRA) 500 MG tablet       . nitroGLYCERIN (NITROLINGUAL) 0.4 MG/SPRAY spray USE (1) SPRAY UNDER TONGUE AS NEEDED FOR CHEST PAIN.  12 g  0  . oxyCODONE-acetaminophen (PERCOCET/ROXICET) 5-325 MG per tablet       . pantoprazole (PROTONIX) 40 MG tablet Take 1 tablet (40 mg total) by mouth daily.  30 tablet  5  . PROAIR HFA 108 (90 BASE) MCG/ACT inhaler INHALE 2 PUFFS EVERY 6 HOURS AS NEEDED.  8.5 g  0  . traZODone (DESYREL) 100 MG tablet Take 200 mg by mouth daily.        . valACYclovir (VALTREX) 500 MG tablet Take 1 tablet (500 mg total) by mouth 2 (two) times daily.  60 tablet  5   No current facility-administered medications for this visit.   Allergies as of 08/05/2013 - Review Complete 08/05/2013  Allergen Reaction Noted  . Codeine Other (See Comments)   . Morphine and related Other (See Comments) 08/12/2010  . Lyrica [pregabalin]  04/22/2013    reports that she has never smoked. She has never used smokeless tobacco. She reports that she does not drink alcohol or use illicit  drugs.     Review of Systems: Pertinent positive and negative review of systems were noted in the above HPI section. All other review of systems were otherwise negative.  Vital signs were reviewed in today's medical record Physical Exam: General: Well developed , well nourished, no acute distress Skin: anicteric Head: Normocephalic and atraumatic Eyes:  sclerae anicteric, EOMI Ears: Normal auditory acuity Mouth: No deformity or lesions Neck: Supple, no masses or thyromegaly Lungs: Clear throughout to auscultation Heart: Regular rate and rhythm; no murmurs, rubs or bruits Abdomen: Soft, non tender and non distended. No masses, hepatosplenomegaly or hernias noted. Normal Bowel sounds.  There is no succussion splash Rectal:deferred Musculoskeletal: Symmetrical with no gross deformities  Skin: No lesions on visible extremities Pulses:  Normal pulses noted Extremities: No clubbing, cyanosis, edema or deformities noted Neurological: Alert oriented x 4, grossly nonfocal Cervical Nodes:  No significant cervical adenopathy Inguinal Nodes: No significant inguinal adenopathy Psychological:  Alert and cooperative. Normal mood and affect  See Assessment and Plan under Problem List

## 2013-08-05 NOTE — Assessment & Plan Note (Signed)
Patient has severe reflux symptoms despite over-the-counter medicines plus Protonix.  Recommendations #1 antireflux measures #2 trial of dexilant 60 mg before breakfast and dinner #3 upper endoscopy

## 2013-08-05 NOTE — Patient Instructions (Signed)
You have been scheduled for an endoscopy with propofol. Please follow written instructions given to you at your visit today. If you use inhalers (even only as needed), please bring them with you on the day of your procedure. Your physician has requested that you go to www.startemmi.com and enter the access code given to you at your visit today. This web site gives a general overview about your procedure. However, you should still follow specific instructions given to you by our office regarding your preparation for the procedure.  Dexilant samples today take one by mouth twice a day every day for 2 weeks  Gastroesophageal Reflux Disease, Adult Gastroesophageal reflux disease (GERD) happens when acid from your stomach flows up into the esophagus. When acid comes in contact with the esophagus, the acid causes soreness (inflammation) in the esophagus. Over time, GERD may create small holes (ulcers) in the lining of the esophagus. CAUSES   Increased body weight. This puts pressure on the stomach, making acid rise from the stomach into the esophagus.  Smoking. This increases acid production in the stomach.  Drinking alcohol. This causes decreased pressure in the lower esophageal sphincter (valve or ring of muscle between the esophagus and stomach), allowing acid from the stomach into the esophagus.  Late evening meals and a full stomach. This increases pressure and acid production in the stomach.  A malformed lower esophageal sphincter. Sometimes, no cause is found. SYMPTOMS   Burning pain in the lower part of the mid-chest behind the breastbone and in the mid-stomach area. This may occur twice a week or more often.  Trouble swallowing.  Sore throat.  Dry cough.  Asthma-like symptoms including chest tightness, shortness of breath, or wheezing. DIAGNOSIS  Your caregiver may be able to diagnose GERD based on your symptoms. In some cases, X-rays and other tests may be done to check for  complications or to check the condition of your stomach and esophagus. TREATMENT  Your caregiver may recommend over-the-counter or prescription medicines to help decrease acid production. Ask your caregiver before starting or adding any new medicines.  HOME CARE INSTRUCTIONS   Change the factors that you can control. Ask your caregiver for guidance concerning weight loss, quitting smoking, and alcohol consumption.  Avoid foods and drinks that make your symptoms worse, such as:  Caffeine or alcoholic drinks.  Chocolate.  Peppermint or mint flavorings.  Garlic and onions.  Spicy foods.  Citrus fruits, such as oranges, lemons, or limes.  Tomato-based foods such as sauce, chili, salsa, and pizza.  Fried and fatty foods.  Avoid lying down for the 3 hours prior to your bedtime or prior to taking a nap.  Eat small, frequent meals instead of large meals.  Wear loose-fitting clothing. Do not wear anything tight around your waist that causes pressure on your stomach.  Raise the head of your bed 6 to 8 inches with wood blocks to help you sleep. Extra pillows will not help.  Only take over-the-counter or prescription medicines for pain, discomfort, or fever as directed by your caregiver.  Do not take aspirin, ibuprofen, or other nonsteroidal anti-inflammatory drugs (NSAIDs). SEEK IMMEDIATE MEDICAL CARE IF:   You have pain in your arms, neck, jaw, teeth, or back.  Your pain increases or changes in intensity or duration.  You develop nausea, vomiting, or sweating (diaphoresis).  You develop shortness of breath, or you faint.  Your vomit is green, yellow, black, or looks like coffee grounds or blood.  Your stool is red, bloody, or  black. These symptoms could be signs of other problems, such as heart disease, gastric bleeding, or esophageal bleeding. MAKE SURE YOU:   Understand these instructions.  Will watch your condition.  Will get help right away if you are not doing well  or get worse. Document Released: 12/25/2004 Document Revised: 06/09/2011 Document Reviewed: 10/04/2010 [facility] Patient Information 2014 [place], [place].

## 2013-08-08 ENCOUNTER — Telehealth: Payer: Self-pay | Admitting: Gastroenterology

## 2013-08-08 ENCOUNTER — Ambulatory Visit (AMBULATORY_SURGERY_CENTER): Payer: Medicare Other | Admitting: Gastroenterology

## 2013-08-08 ENCOUNTER — Encounter: Payer: Self-pay | Admitting: Gastroenterology

## 2013-08-08 VITALS — BP 144/92 | HR 57 | Temp 98.2°F | Resp 18 | Ht 61.0 in | Wt 154.0 lb

## 2013-08-08 DIAGNOSIS — I10 Essential (primary) hypertension: Secondary | ICD-10-CM | POA: Diagnosis not present

## 2013-08-08 DIAGNOSIS — J45909 Unspecified asthma, uncomplicated: Secondary | ICD-10-CM | POA: Diagnosis not present

## 2013-08-08 DIAGNOSIS — R1310 Dysphagia, unspecified: Secondary | ICD-10-CM

## 2013-08-08 DIAGNOSIS — K222 Esophageal obstruction: Secondary | ICD-10-CM

## 2013-08-08 DIAGNOSIS — K219 Gastro-esophageal reflux disease without esophagitis: Secondary | ICD-10-CM | POA: Diagnosis not present

## 2013-08-08 DIAGNOSIS — I252 Old myocardial infarction: Secondary | ICD-10-CM | POA: Diagnosis not present

## 2013-08-08 DIAGNOSIS — IMO0001 Reserved for inherently not codable concepts without codable children: Secondary | ICD-10-CM | POA: Diagnosis not present

## 2013-08-08 DIAGNOSIS — F341 Dysthymic disorder: Secondary | ICD-10-CM | POA: Diagnosis not present

## 2013-08-08 DIAGNOSIS — G4733 Obstructive sleep apnea (adult) (pediatric): Secondary | ICD-10-CM | POA: Diagnosis not present

## 2013-08-08 MED ORDER — SODIUM CHLORIDE 0.9 % IV SOLN: 500.0000 mL | INTRAVENOUS | Status: DC

## 2013-08-08 MED ORDER — DEXLANSOPRAZOLE 60 MG PO CPDR: 60.0000 mg | DELAYED_RELEASE_CAPSULE | Freq: Two times a day (BID) | ORAL | Status: DC

## 2013-08-08 NOTE — Progress Notes (Signed)
Patient denies any allergies to eggs or soy. Patient denies any problems with anesthesia/sedation. Patient denies any oxygen use at home and does not take any diet/weight loss medications.  

## 2013-08-08 NOTE — Op Note (Signed)
[facility] [address] [place], [address]   ENDOSCOPY PROCEDURE REPORT  PATIENT: [name], [name]  MR#: [id number] BIRTHDATE: [birth year] , 57  yrs. old GENDER: Female ENDOSCOPIST: Louis Meckel, MD REFERRED BY:  Rudi Heap, M.D. PROCEDURE DATE:  08/08/2013 PROCEDURE:  EGD, diagnostic and Maloney dilation of esophagus ASA CLASS:     Class II INDICATIONS:  Heartburn.   Dysphagia. MEDICATIONS: MAC sedation, administered by CRNA, propofol (Diprivan) 100mg  IV, and Simethicone 0.6cc PO TOPICAL ANESTHETIC: Cetacaine Spray  DESCRIPTION OF PROCEDURE: After the risks benefits and alternatives of the procedure were thoroughly explained, informed consent was obtained.  The LB [id number] endoscope was introduced through the mouth and advanced to the third portion of the duodenum. Without limitations.  The instrument was slowly withdrawn as the mucosa was fully examined.      A stricture was seen at the GE junction.  There were a few erosions at the GE junction.  The 9 mm gastroscope easily traversed the stricture.  A 2-3 cm sliding hiatal hernia was present.   The remainder of the upper endoscopy exam was otherwise normal. Retroflexed views revealed no abnormalities.     The scope was then withdrawn from the patient and the procedure completed. A #52 [id number] dilator was passed with moderate resistance.  There was no heme.  COMPLICATIONS: There were no complications.  ENDOSCOPIC IMPRESSION: 1.  esophageal stricture-status post Maloney dilation 2.  erosive esophagitis  RECOMMENDATIONS: 1.  Continued dexilant 60 mg twice a day for 2 weeks and then lower to once daily 2.  office visit 8 weeks REPEAT EXAM:  eSigned:  Jerene Dilling, MD 08/08/2013 9:31 AM   CC:

## 2013-08-08 NOTE — Patient Instructions (Signed)

## 2013-08-08 NOTE — Progress Notes (Signed)
Report to pacu rn, vss, bbs=clear 

## 2013-08-08 NOTE — Telephone Encounter (Signed)
Called dexilant into pharmacy Patient aware

## 2013-08-09 ENCOUNTER — Telehealth: Payer: Self-pay | Admitting: *Deleted

## 2013-08-09 NOTE — Telephone Encounter (Signed)
MM, Ins denied acyclovir ointment because they say it is not prescribed for a medically accepted indication. Zovirax cream is supported for use in this indication and may be available to the member after previous treatment within the past 12 months. Contraindication, intolerance to at least TWO of the following: oral acyclovir, valacylovir, or famciclovir.Hope this makes sense as I pretty much copied this from the denial. . Let me know if I need to do anything else.  Thanks.

## 2013-08-09 NOTE — Telephone Encounter (Signed)
  Follow up Call-  Call back number 08/08/2013  Post procedure Call Back phone  # [phone]  Permission to leave phone message Yes     Patient questions:  Do you have a fever, pain , or abdominal swelling? no Pain Score  0 *  Have you tolerated food without any problems? yes  Have you been able to return to your normal activities? yes  Do you have any questions about your discharge instructions: Diet   no Medications  no Follow up visit  no  Do you have questions or concerns about your Care? no  Actions: * If pain score is 4 or above: No action needed, pain <4.

## 2013-08-09 NOTE — Telephone Encounter (Signed)
Please let patient know ins will not pay for acyclovir ointmnet- has to have oral meds - does she want me to call something in.

## 2013-08-10 DIAGNOSIS — IMO0001 Reserved for inherently not codable concepts without codable children: Secondary | ICD-10-CM | POA: Diagnosis not present

## 2013-08-10 DIAGNOSIS — M6281 Muscle weakness (generalized): Secondary | ICD-10-CM | POA: Diagnosis not present

## 2013-08-10 DIAGNOSIS — M25519 Pain in unspecified shoulder: Secondary | ICD-10-CM | POA: Diagnosis not present

## 2013-08-10 NOTE — Telephone Encounter (Signed)
Talked with "butterfly" and she  said she is much better from this outbreak but she would like to have something to take when she has next one. She also said mental health was trying to take her off her valium and this is causing her to have more outbreaks.

## 2013-08-11 ENCOUNTER — Other Ambulatory Visit: Payer: Self-pay | Admitting: Nurse Practitioner

## 2013-08-11 MED ORDER — ACYCLOVIR 200 MG PO CAPS: 200.0000 mg | ORAL_CAPSULE | Freq: Every day | ORAL | Status: DC

## 2013-08-11 NOTE — Telephone Encounter (Signed)
Will have to do pills next tome because they will pay for that. rx sent to pharmacy

## 2013-08-12 DIAGNOSIS — IMO0001 Reserved for inherently not codable concepts without codable children: Secondary | ICD-10-CM | POA: Diagnosis not present

## 2013-08-12 DIAGNOSIS — M25519 Pain in unspecified shoulder: Secondary | ICD-10-CM | POA: Diagnosis not present

## 2013-08-12 DIAGNOSIS — M6281 Muscle weakness (generalized): Secondary | ICD-10-CM | POA: Diagnosis not present

## 2013-08-12 NOTE — Telephone Encounter (Signed)
Last seen 07/27/13, last filled 07/19/13, if approved, route to pool A so it can be called into [facility]

## 2013-08-15 ENCOUNTER — Other Ambulatory Visit: Payer: Self-pay | Admitting: Family

## 2013-08-15 DIAGNOSIS — M7530 Calcific tendinitis of unspecified shoulder: Secondary | ICD-10-CM | POA: Diagnosis not present

## 2013-08-15 DIAGNOSIS — M25519 Pain in unspecified shoulder: Secondary | ICD-10-CM | POA: Diagnosis not present

## 2013-08-15 DIAGNOSIS — IMO0001 Reserved for inherently not codable concepts without codable children: Secondary | ICD-10-CM | POA: Diagnosis not present

## 2013-08-15 DIAGNOSIS — M6281 Muscle weakness (generalized): Secondary | ICD-10-CM | POA: Diagnosis not present

## 2013-08-15 MED ORDER — BUTALBITAL-APAP-CAFFEINE 50-325-40 MG PO TABS: ORAL_TABLET | ORAL | Status: DC

## 2013-08-15 NOTE — Telephone Encounter (Signed)
RX of fioricet printed ready for pt to pick-up. Please advise pt she should not be taking this everyday. Will not order this medication every month. Medication may be causing rebound headaches because of caffeine. If migraines are that severe may been to see neurologist.

## 2013-08-15 NOTE — Telephone Encounter (Signed)
Patient aware to pick up 

## 2013-08-17 DIAGNOSIS — M545 Low back pain, unspecified: Secondary | ICD-10-CM | POA: Diagnosis not present

## 2013-08-17 DIAGNOSIS — M542 Cervicalgia: Secondary | ICD-10-CM | POA: Diagnosis not present

## 2013-08-19 ENCOUNTER — Other Ambulatory Visit: Payer: Self-pay | Admitting: Specialist

## 2013-08-19 DIAGNOSIS — IMO0001 Reserved for inherently not codable concepts without codable children: Secondary | ICD-10-CM | POA: Diagnosis not present

## 2013-08-19 DIAGNOSIS — M542 Cervicalgia: Secondary | ICD-10-CM

## 2013-08-19 DIAGNOSIS — M25519 Pain in unspecified shoulder: Secondary | ICD-10-CM | POA: Diagnosis not present

## 2013-08-19 DIAGNOSIS — M6281 Muscle weakness (generalized): Secondary | ICD-10-CM | POA: Diagnosis not present

## 2013-08-19 DIAGNOSIS — R531 Weakness: Secondary | ICD-10-CM

## 2013-08-24 DIAGNOSIS — M25519 Pain in unspecified shoulder: Secondary | ICD-10-CM | POA: Diagnosis not present

## 2013-08-24 DIAGNOSIS — M6281 Muscle weakness (generalized): Secondary | ICD-10-CM | POA: Diagnosis not present

## 2013-08-24 DIAGNOSIS — IMO0001 Reserved for inherently not codable concepts without codable children: Secondary | ICD-10-CM | POA: Diagnosis not present

## 2013-08-25 ENCOUNTER — Other Ambulatory Visit: Payer: Medicare Other

## 2013-08-25 DIAGNOSIS — IMO0001 Reserved for inherently not codable concepts without codable children: Secondary | ICD-10-CM | POA: Diagnosis not present

## 2013-08-25 DIAGNOSIS — M25519 Pain in unspecified shoulder: Secondary | ICD-10-CM | POA: Diagnosis not present

## 2013-08-25 DIAGNOSIS — M6281 Muscle weakness (generalized): Secondary | ICD-10-CM | POA: Diagnosis not present

## 2013-08-26 ENCOUNTER — Ambulatory Visit
Admission: RE | Admit: 2013-08-26 | Discharge: 2013-08-26 | Disposition: A | Discharge status: Home / Self Care | Payer: Medicare Other | Source: Ambulatory Visit | Attending: Specialist | Admitting: Specialist

## 2013-08-26 DIAGNOSIS — M542 Cervicalgia: Secondary | ICD-10-CM

## 2013-08-26 DIAGNOSIS — M5030 Other cervical disc degeneration, unspecified cervical region: Secondary | ICD-10-CM | POA: Diagnosis not present

## 2013-08-26 DIAGNOSIS — M47812 Spondylosis without myelopathy or radiculopathy, cervical region: Secondary | ICD-10-CM | POA: Diagnosis not present

## 2013-08-26 DIAGNOSIS — R531 Weakness: Secondary | ICD-10-CM

## 2013-08-30 DIAGNOSIS — M6281 Muscle weakness (generalized): Secondary | ICD-10-CM | POA: Diagnosis not present

## 2013-08-30 DIAGNOSIS — IMO0001 Reserved for inherently not codable concepts without codable children: Secondary | ICD-10-CM | POA: Diagnosis not present

## 2013-08-30 DIAGNOSIS — M25519 Pain in unspecified shoulder: Secondary | ICD-10-CM | POA: Diagnosis not present

## 2013-08-31 DIAGNOSIS — M542 Cervicalgia: Secondary | ICD-10-CM | POA: Diagnosis not present

## 2013-08-31 DIAGNOSIS — M47812 Spondylosis without myelopathy or radiculopathy, cervical region: Secondary | ICD-10-CM | POA: Diagnosis not present

## 2013-08-31 DIAGNOSIS — M719 Bursopathy, unspecified: Secondary | ICD-10-CM | POA: Diagnosis not present

## 2013-08-31 DIAGNOSIS — M67919 Unspecified disorder of synovium and tendon, unspecified shoulder: Secondary | ICD-10-CM | POA: Diagnosis not present

## 2013-08-31 DIAGNOSIS — S40019A Contusion of unspecified shoulder, initial encounter: Secondary | ICD-10-CM | POA: Diagnosis not present

## 2013-09-09 ENCOUNTER — Ambulatory Visit (INDEPENDENT_AMBULATORY_CARE_PROVIDER_SITE_OTHER): Payer: Self-pay | Admitting: Radiology

## 2013-09-09 ENCOUNTER — Ambulatory Visit (INDEPENDENT_AMBULATORY_CARE_PROVIDER_SITE_OTHER): Payer: Medicare Other | Admitting: Neurology

## 2013-09-09 DIAGNOSIS — M47817 Spondylosis without myelopathy or radiculopathy, lumbosacral region: Secondary | ICD-10-CM | POA: Diagnosis not present

## 2013-09-09 DIAGNOSIS — M79609 Pain in unspecified limb: Secondary | ICD-10-CM

## 2013-09-09 DIAGNOSIS — Z0289 Encounter for other administrative examinations: Secondary | ICD-10-CM

## 2013-09-09 DIAGNOSIS — M216X9 Other acquired deformities of unspecified foot: Secondary | ICD-10-CM

## 2013-09-09 DIAGNOSIS — M21372 Foot drop, left foot: Secondary | ICD-10-CM

## 2013-09-09 DIAGNOSIS — M21371 Foot drop, right foot: Secondary | ICD-10-CM

## 2013-09-09 NOTE — Procedures (Signed)
     HISTORY:  [name] is a 58 year old patient with a history of prior lumbosacral spine surgery. She gives a 5 to six-year history of back pain, and intermittent numbness down the legs, right greater than left with bilateral foot drops. The patient is being evaluated for this issue.  NERVE CONDUCTION STUDIES:  Nerve conduction studies were performed on the right upper extremity. The distal motor latencies and motor amplitudes for the median and ulnar nerves were within normal limits. The F wave latencies and nerve conduction velocities for these nerves were also normal. The sensory latencies for the median, radial, and ulnar nerves were normal.  Nerve conduction studies were performed on both lower extremities. The distal motor latencies and motor amplitudes for the peroneal and posterior tibial nerves were within normal limits. The nerve conduction velocities for these nerves were also normal. The F wave latencies for the peroneal and posterior tibial nerves were normal bilaterally. The sensory latencies for the peroneal nerves were within normal limits.   EMG STUDIES:  EMG study was performed on the right lower extremity:  The tibialis anterior muscle reveals 2 to 4K motor units with full recruitment. No fibrillations or positive waves were seen. The peroneus tertius muscle reveals 2 to 4K motor units with full recruitment. No fibrillations or positive waves were seen. The medial gastrocnemius muscle reveals 1 to 3K motor units with full recruitment. No fibrillations or positive waves were seen. The vastus lateralis muscle reveals 2 to 4K motor units with full recruitment. No fibrillations or positive waves were seen. The iliopsoas muscle reveals 2 to 4K motor units with full recruitment. No fibrillations or positive waves were seen. The biceps femoris muscle (long head) reveals 2 to 4K motor units with full recruitment. No fibrillations or positive waves were seen. The  lumbosacral paraspinal muscles were tested at 3 levels, and revealed no abnormalities of insertional activity at the upper and middle levels tested. 2+ fibrillations were seen at the lower level. There was good relaxation.  EMG study was performed on the left lower extremity:  The tibialis anterior muscle reveals 2 to 4K motor units with full recruitment. No fibrillations or positive waves were seen. The peroneus tertius muscle reveals 2 to 4K motor units with full recruitment. No fibrillations or positive waves were seen. The medial gastrocnemius muscle reveals 1 to 3K motor units with full recruitment. No fibrillations or positive waves were seen. The vastus lateralis muscle reveals 2 to 4K motor units with full recruitment. No fibrillations or positive waves were seen. The iliopsoas muscle reveals 2 to 4K motor units with full recruitment. No fibrillations or positive waves were seen. The biceps femoris muscle (long head) reveals 2 to 4K motor units with full recruitment. No fibrillations or positive waves were seen. The lumbosacral paraspinal muscles were tested at 3 levels, and revealed no abnormalities of insertional activity at all 3 levels tested. There was good relaxation.   IMPRESSION:  Nerve conduction studies done on the right upper extremity and both lower extremities were unremarkable, without evidence of a peripheral neuropathy. EMG evaluation of both lower extremities were unremarkable, without evidence of an overlying lumbosacral radiculopathy on either side.  Marlan Palau MD 09/09/2013 11:02 AM  [facility] [address] [place], [place] [address]  Phone [phone] Fax [phone]

## 2013-09-13 DIAGNOSIS — F4310 Post-traumatic stress disorder, unspecified: Secondary | ICD-10-CM | POA: Diagnosis not present

## 2013-09-15 DIAGNOSIS — M25519 Pain in unspecified shoulder: Secondary | ICD-10-CM | POA: Diagnosis not present

## 2013-09-15 DIAGNOSIS — M47812 Spondylosis without myelopathy or radiculopathy, cervical region: Secondary | ICD-10-CM | POA: Diagnosis not present

## 2013-09-15 DIAGNOSIS — M7530 Calcific tendinitis of unspecified shoulder: Secondary | ICD-10-CM | POA: Diagnosis not present

## 2013-09-19 ENCOUNTER — Encounter: Payer: Medicare Other | Attending: Physical Medicine & Rehabilitation

## 2013-09-19 ENCOUNTER — Ambulatory Visit (HOSPITAL_BASED_OUTPATIENT_CLINIC_OR_DEPARTMENT_OTHER): Payer: Medicare Other | Admitting: Physical Medicine & Rehabilitation

## 2013-09-19 ENCOUNTER — Encounter: Payer: Self-pay | Admitting: Physical Medicine & Rehabilitation

## 2013-09-19 VITALS — BP 121/76 | HR 65 | Resp 16 | Ht 61.0 in | Wt 159.0 lb

## 2013-09-19 DIAGNOSIS — M961 Postlaminectomy syndrome, not elsewhere classified: Secondary | ICD-10-CM | POA: Diagnosis not present

## 2013-09-19 DIAGNOSIS — E785 Hyperlipidemia, unspecified: Secondary | ICD-10-CM | POA: Insufficient documentation

## 2013-09-19 DIAGNOSIS — M5412 Radiculopathy, cervical region: Secondary | ICD-10-CM

## 2013-09-19 DIAGNOSIS — G4730 Sleep apnea, unspecified: Secondary | ICD-10-CM | POA: Diagnosis not present

## 2013-09-19 DIAGNOSIS — M546 Pain in thoracic spine: Secondary | ICD-10-CM | POA: Insufficient documentation

## 2013-09-19 DIAGNOSIS — M4802 Spinal stenosis, cervical region: Secondary | ICD-10-CM

## 2013-09-19 DIAGNOSIS — M069 Rheumatoid arthritis, unspecified: Secondary | ICD-10-CM | POA: Insufficient documentation

## 2013-09-19 DIAGNOSIS — M810 Age-related osteoporosis without current pathological fracture: Secondary | ICD-10-CM | POA: Diagnosis not present

## 2013-09-19 DIAGNOSIS — R209 Unspecified disturbances of skin sensation: Secondary | ICD-10-CM | POA: Insufficient documentation

## 2013-09-19 DIAGNOSIS — I2510 Atherosclerotic heart disease of native coronary artery without angina pectoris: Secondary | ICD-10-CM | POA: Insufficient documentation

## 2013-09-19 DIAGNOSIS — K219 Gastro-esophageal reflux disease without esophagitis: Secondary | ICD-10-CM | POA: Diagnosis not present

## 2013-09-19 DIAGNOSIS — R252 Cramp and spasm: Secondary | ICD-10-CM | POA: Diagnosis not present

## 2013-09-19 NOTE — Progress Notes (Signed)
Subjective:    Patient ID: [name], female    DOB: 09-05-1955, 58 y.o.   MRN: [id number]  HPI Patient returns today continues to have neck pain going into the right arm. Some pain with right shoulder motion. Also has pain with neck range of motion particularly to the right side. Has been seen by orthopedic spine surgery. CT of the cervical spine was performed demonstrating C3-4 C4-5 C5-6 foraminal stenosis on the right side. Has also seen neurology. EMG/NCV was normal of the upper and lower limbs  Patient has been started on seizure medicine by neurology as well. On Keppra.  Independent in all her self-care and mobility. Pain Inventory Average Pain 8 Pain Right Now 8 My pain is sharp, stabbing and aching  In the last 24 hours, has pain interfered with the following? General activity 1 Relation with others 1 Enjoyment of life 1 What TIME of day is your pain at its worst? constant Sleep (in general) Poor  Pain is worse with: walking, bending, sitting, standing and some activites Pain improves with: heat/ice and medication Relief from Meds: 8  Mobility use a cane ability to climb steps?  yes do you drive?  no  Function not employed: date last employed .  Neuro/Psych weakness numbness spasms depression anxiety  Prior Studies Any changes since last visit?  yes CLINICAL DATA:  Neck and right shoulder pain.   EXAM: CT CERVICAL SPINE WITHOUT CONTRAST   TECHNIQUE: Multidetector CT imaging of the cervical spine was performed without intravenous contrast. Multiplanar CT image reconstructions were also generated.   COMPARISON:  None.   FINDINGS: Vertebral body height and alignment are maintained. Imaged paraspinous structures are unremarkable. Lung apices are clear.   C2-3:  Negative.   C3-4: Right worse than left uncovertebral disease is present and there is right much worse than left facet arthropathy. The central canal is open with no disc  bulge or protrusion identified. Severe right foraminal narrowing is present. The left foramen is open.   C4-5: Facet arthropathy and uncovertebral disease cause severe right foraminal narrowing. There is a shallow disc bulge and mild uncovertebral disease on the left but the central canal and left foramen appear open.   C5-6: Right much worse than left uncovertebral disease is present. There is moderately severe to severe right foraminal narrowing. Minimal disc bulge is noted but the central canal and left foramen appear open.   C6-7: Mild appearing uncovertebral disease and facet degenerative change are seen. The central canal and foramina appear open.   C7-T1:  Negative.   IMPRESSION: Marked right foraminal narrowing C3-4, C4-5 and C5-6 due to uncovertebral disease and facet arthropathy as described above. The central canal appears open at all levels with mild disc bulging noted as above.    Physicians involved in your care Any changes since last visit?  no   Family History  Problem Relation Age of Onset  . Heart attack Father   . Cancer Father   . Mental illness Father   . Heart attack Brother     stents  . Cancer Mother   . Mental illness Mother   . Colon cancer Maternal Aunt   . Stomach cancer Neg Hx    History   Social History  . Marital Status: Single    Spouse Name: N/A    Number of Children: N/A  . Years of Education: N/A   Social History Main Topics  . Smoking status: Never Smoker   . Smokeless tobacco:  Never Used  . Alcohol Use: No  . Drug Use: No  . Sexual Activity: None   Other Topics Concern  . None   Social History Narrative   Patient lives at home with her brother.   Patient is on disability.    Patient has 8th grade education.    Patient has 6 children, 20 grand-chilren, and 3 great-grand children.    Past Surgical History  Procedure Laterality Date  . Insertion of implatable loop recorder  08/11/2007    [name]  . Head up tilt  table testing  06/15/2007    [name]  . Back surgery    . Doppler echocardiography  2009   Past Medical History  Diagnosis Date  . Anxiety   . Herpes simplex infection   . Osteoporosis   . Sleep apnea   . Hypertension   . Dyslipidemia   . Depression   . Asthma   . Fibromyalgia   . GERD (gastroesophageal reflux disease)   . Head injury, unspecified   . Supraventricular tachycardia   . Syncope and collapse     s/p ILR; no arhythmogenic cause identified  . Coronary artery disease     reported hx of "MI";  Echo 2009 with normal LVF;  Myoview 05/2011: no ischemia  . Spondylolisthesis   . Spinal stenosis of lumbar region   . Hyperlipidemia   . Bipolar 1 disorder   . Insomnia   . Arthritis     RHEUMATOID   BP 121/76  Pulse 65  Resp 16  Ht 5\' 1"  (1.549 m)  Wt 159 lb (72.122 kg)  BMI 30.06 kg/m2  SpO2 95%  Opioid Risk Score: 9 Fall Risk Score: Moderate Fall Risk (6-13 points) (patient educated handout declined)   Review of Systems  Constitutional: Positive for unexpected weight change.  Respiratory: Positive for apnea, cough and wheezing.   Gastrointestinal: Positive for abdominal pain.  Neurological: Positive for weakness and numbness.       Spasm  Psychiatric/Behavioral: Positive for dysphoric mood. The patient is nervous/anxious.   All other systems reviewed and are negative.      Objective:   Physical Exam  Decreased sensation right C5, normal sensation right C6, normal sensation right C7, reduced sensation right C8.   Deep tendon reflexes are normal bilateral biceps triceps and brachioradialis Strength is reduced in the right deltoid partially limited by pain. Normal biceps triceps grip strength, normal wrist extensor strength.  Mood and affect appropriate Patient appears comfortable in no acute distress Decreased range of motion with cervical flexion extension and lateral bending mainly toward the right side. Negative Spurling's Mild tenderness  palpation cervical paraspinal muscles.    Assessment & Plan:  1. 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 Fortunately she has no signs of nerve damage on EMG.   She is scheduled to see her spine surgeon again in 2 days. Patient is functioning well at current pain levels. Opioid risk score 9, which is high-risk for opioid abuse or misuse  We discussed that due to her elevated Opioid Risk Tool score we would not be able to prescribe long-term narcotic analgesics. Postoperatively she'll get her narcotic analgesics from her spine surgeon. If she requires long-term narcotics after that she would need to get this from a clinic that has in house addiction specialist as per [employer] pain medicine recommendations  F/u prn, for nonnarcotic management including therapy prescription,  trigger point injections, medial branch blocks  as needed.

## 2013-09-19 NOTE — Patient Instructions (Signed)
Postoperative pain medications by orthopedic spine surgeon

## 2013-09-20 ENCOUNTER — Other Ambulatory Visit: Payer: Self-pay | Admitting: General Practice

## 2013-09-21 DIAGNOSIS — M542 Cervicalgia: Secondary | ICD-10-CM | POA: Diagnosis not present

## 2013-09-21 DIAGNOSIS — M47812 Spondylosis without myelopathy or radiculopathy, cervical region: Secondary | ICD-10-CM | POA: Diagnosis not present

## 2013-09-21 NOTE — Telephone Encounter (Signed)
rx ready for pickup 

## 2013-09-21 NOTE — Telephone Encounter (Signed)
Patient last seen in office on 07-27-13. Rx last filled on 08-15-13 for #60. Please advise. If approved please route to pool B so nurse can phone in to pharmacy

## 2013-09-22 NOTE — Telephone Encounter (Signed)
Patient aware.

## 2013-09-26 ENCOUNTER — Telehealth: Payer: Self-pay | Admitting: Family Medicine

## 2013-09-26 DIAGNOSIS — R509 Fever, unspecified: Secondary | ICD-10-CM | POA: Diagnosis not present

## 2013-09-26 DIAGNOSIS — N201 Calculus of ureter: Secondary | ICD-10-CM | POA: Diagnosis not present

## 2013-09-26 DIAGNOSIS — R059 Cough, unspecified: Secondary | ICD-10-CM | POA: Diagnosis not present

## 2013-09-26 DIAGNOSIS — I998 Other disorder of circulatory system: Secondary | ICD-10-CM | POA: Diagnosis not present

## 2013-09-26 DIAGNOSIS — R319 Hematuria, unspecified: Secondary | ICD-10-CM | POA: Diagnosis not present

## 2013-09-26 DIAGNOSIS — R3915 Urgency of urination: Secondary | ICD-10-CM | POA: Diagnosis not present

## 2013-09-26 DIAGNOSIS — R05 Cough: Secondary | ICD-10-CM | POA: Diagnosis not present

## 2013-09-26 NOTE — Telephone Encounter (Signed)
appt given for tomorrow with bill 

## 2013-09-27 ENCOUNTER — Ambulatory Visit: Payer: Medicare Other | Admitting: Family Medicine

## 2013-09-28 ENCOUNTER — Ambulatory Visit: Payer: Medicare Other | Attending: Specialist | Admitting: Physical Therapy

## 2013-09-28 DIAGNOSIS — M542 Cervicalgia: Secondary | ICD-10-CM | POA: Insufficient documentation

## 2013-09-28 DIAGNOSIS — N201 Calculus of ureter: Secondary | ICD-10-CM | POA: Diagnosis not present

## 2013-09-28 DIAGNOSIS — J45909 Unspecified asthma, uncomplicated: Secondary | ICD-10-CM | POA: Diagnosis not present

## 2013-09-28 DIAGNOSIS — M67919 Unspecified disorder of synovium and tendon, unspecified shoulder: Secondary | ICD-10-CM | POA: Insufficient documentation

## 2013-09-28 DIAGNOSIS — IMO0001 Reserved for inherently not codable concepts without codable children: Secondary | ICD-10-CM | POA: Insufficient documentation

## 2013-09-28 DIAGNOSIS — I10 Essential (primary) hypertension: Secondary | ICD-10-CM | POA: Insufficient documentation

## 2013-09-28 DIAGNOSIS — R293 Abnormal posture: Secondary | ICD-10-CM | POA: Diagnosis not present

## 2013-09-28 DIAGNOSIS — Z981 Arthrodesis status: Secondary | ICD-10-CM | POA: Insufficient documentation

## 2013-09-28 DIAGNOSIS — M25519 Pain in unspecified shoulder: Secondary | ICD-10-CM | POA: Diagnosis not present

## 2013-09-28 DIAGNOSIS — R569 Unspecified convulsions: Secondary | ICD-10-CM | POA: Insufficient documentation

## 2013-09-28 DIAGNOSIS — M47812 Spondylosis without myelopathy or radiculopathy, cervical region: Secondary | ICD-10-CM | POA: Diagnosis not present

## 2013-09-28 DIAGNOSIS — R5381 Other malaise: Secondary | ICD-10-CM | POA: Diagnosis not present

## 2013-09-28 DIAGNOSIS — M719 Bursopathy, unspecified: Secondary | ICD-10-CM | POA: Diagnosis not present

## 2013-09-28 DIAGNOSIS — Z950 Presence of cardiac pacemaker: Secondary | ICD-10-CM | POA: Diagnosis not present

## 2013-10-03 ENCOUNTER — Ambulatory Visit: Payer: Medicare Other | Admitting: Physical Therapy

## 2013-10-03 DIAGNOSIS — M25519 Pain in unspecified shoulder: Secondary | ICD-10-CM | POA: Diagnosis not present

## 2013-10-03 DIAGNOSIS — R293 Abnormal posture: Secondary | ICD-10-CM | POA: Diagnosis not present

## 2013-10-03 DIAGNOSIS — IMO0001 Reserved for inherently not codable concepts without codable children: Secondary | ICD-10-CM | POA: Diagnosis not present

## 2013-10-03 DIAGNOSIS — M67919 Unspecified disorder of synovium and tendon, unspecified shoulder: Secondary | ICD-10-CM | POA: Diagnosis not present

## 2013-10-03 DIAGNOSIS — M47812 Spondylosis without myelopathy or radiculopathy, cervical region: Secondary | ICD-10-CM | POA: Diagnosis not present

## 2013-10-03 DIAGNOSIS — M542 Cervicalgia: Secondary | ICD-10-CM | POA: Diagnosis not present

## 2013-10-04 ENCOUNTER — Ambulatory Visit: Payer: Medicare Other | Admitting: Physical Therapy

## 2013-10-04 DIAGNOSIS — R293 Abnormal posture: Secondary | ICD-10-CM | POA: Diagnosis not present

## 2013-10-04 DIAGNOSIS — IMO0001 Reserved for inherently not codable concepts without codable children: Secondary | ICD-10-CM | POA: Diagnosis not present

## 2013-10-04 DIAGNOSIS — M542 Cervicalgia: Secondary | ICD-10-CM | POA: Diagnosis not present

## 2013-10-04 DIAGNOSIS — M47812 Spondylosis without myelopathy or radiculopathy, cervical region: Secondary | ICD-10-CM | POA: Diagnosis not present

## 2013-10-04 DIAGNOSIS — M25519 Pain in unspecified shoulder: Secondary | ICD-10-CM | POA: Diagnosis not present

## 2013-10-04 DIAGNOSIS — M67919 Unspecified disorder of synovium and tendon, unspecified shoulder: Secondary | ICD-10-CM | POA: Diagnosis not present

## 2013-10-05 ENCOUNTER — Ambulatory Visit (INDEPENDENT_AMBULATORY_CARE_PROVIDER_SITE_OTHER): Payer: Medicare Other | Admitting: Gastroenterology

## 2013-10-05 ENCOUNTER — Encounter: Payer: Self-pay | Admitting: Gastroenterology

## 2013-10-05 VITALS — BP 130/84 | HR 64 | Ht 61.0 in | Wt 155.1 lb

## 2013-10-05 DIAGNOSIS — M545 Low back pain, unspecified: Secondary | ICD-10-CM | POA: Diagnosis not present

## 2013-10-05 DIAGNOSIS — M25559 Pain in unspecified hip: Secondary | ICD-10-CM | POA: Diagnosis not present

## 2013-10-05 DIAGNOSIS — K2100 Gastro-esophageal reflux disease with esophagitis, without bleeding: Secondary | ICD-10-CM | POA: Diagnosis not present

## 2013-10-05 DIAGNOSIS — R1310 Dysphagia, unspecified: Secondary | ICD-10-CM

## 2013-10-05 DIAGNOSIS — S7000XA Contusion of unspecified hip, initial encounter: Secondary | ICD-10-CM | POA: Diagnosis not present

## 2013-10-05 DIAGNOSIS — M47812 Spondylosis without myelopathy or radiculopathy, cervical region: Secondary | ICD-10-CM | POA: Diagnosis not present

## 2013-10-05 MED ORDER — OMEPRAZOLE-SODIUM BICARBONATE 40-1100 MG PO CAPS: ORAL_CAPSULE | ORAL | Status: DC

## 2013-10-05 NOTE — Patient Instructions (Signed)
Your medication is being sent to your pharmacy Call in 2 weeks to report your progress

## 2013-10-05 NOTE — Assessment & Plan Note (Signed)
She continues to be symptomatic, especially when she reduces dexilant to once daily.  Symptoms are worse at night.  Recommendations #1 trial of Zegerid 40 mg every morning and each bedtime #2 to consider upper GI series pending results of #1

## 2013-10-05 NOTE — Progress Notes (Signed)
          History of Present Illness:  The patient has returned following upper endoscopy with dilation of a distal esophageal stricture.  She was also placed on dexilant.  She reports significant improvement in dysphagia although  occasionally she feels the passage of food.  She still has nocturnal regurgitation of gastric contents.  She has curtailed her diet and does not eat after 5 PM.  Symptoms are especially severe if she reduces dexilant to once daily.  She'll ready is on baclofen.    Review of Systems: Pertinent positive and negative review of systems were noted in the above HPI section. All other review of systems were otherwise negative.    Current Medications, Allergies, Past Medical History, Past Surgical History, Family History and Social History were reviewed in [facility] electronic medical record  Vital signs were reviewed in today's medical record. Physical Exam: General: Well developed , well nourished, no acute distress Skin: anicteric  See Assessment and Plan under Problem List

## 2013-10-05 NOTE — Assessment & Plan Note (Signed)
Improved following dilatation of a distal esophageal stricture

## 2013-10-06 ENCOUNTER — Telehealth: Payer: Self-pay | Admitting: Gastroenterology

## 2013-10-06 MED ORDER — RANITIDINE HCL 150 MG PO TABS: ORAL_TABLET | ORAL | Status: DC

## 2013-10-06 NOTE — Telephone Encounter (Signed)
Resume dexilant 60 mg before breakfast. Begin Zantac 150 mg each bedtime

## 2013-10-06 NOTE — Telephone Encounter (Signed)
Patient aware that Zantac was sent to her pharmacy

## 2013-10-06 NOTE — Telephone Encounter (Signed)
Please advise Dr Kaplan 

## 2013-10-11 ENCOUNTER — Other Ambulatory Visit: Payer: Self-pay | Admitting: Specialist

## 2013-10-11 DIAGNOSIS — M47819 Spondylosis without myelopathy or radiculopathy, site unspecified: Secondary | ICD-10-CM

## 2013-10-11 DIAGNOSIS — R200 Anesthesia of skin: Secondary | ICD-10-CM

## 2013-10-13 ENCOUNTER — Encounter: Payer: Self-pay | Admitting: *Deleted

## 2013-10-17 ENCOUNTER — Ambulatory Visit
Admission: RE | Admit: 2013-10-17 | Discharge: 2013-10-17 | Disposition: A | Discharge status: Home / Self Care | Payer: Medicare Other | Source: Ambulatory Visit | Attending: Specialist | Admitting: Specialist

## 2013-10-17 VITALS — BP 113/61 | HR 48

## 2013-10-17 DIAGNOSIS — R200 Anesthesia of skin: Secondary | ICD-10-CM

## 2013-10-17 DIAGNOSIS — M4802 Spinal stenosis, cervical region: Secondary | ICD-10-CM | POA: Diagnosis not present

## 2013-10-17 DIAGNOSIS — M47819 Spondylosis without myelopathy or radiculopathy, site unspecified: Secondary | ICD-10-CM

## 2013-10-17 DIAGNOSIS — M5412 Radiculopathy, cervical region: Secondary | ICD-10-CM

## 2013-10-17 DIAGNOSIS — M47812 Spondylosis without myelopathy or radiculopathy, cervical region: Secondary | ICD-10-CM | POA: Diagnosis not present

## 2013-10-17 MED ORDER — ONDANSETRON HCL 4 MG/2ML IJ SOLN
4.0000 mg | Freq: Once | INTRAMUSCULAR | Status: AC
  Administered 2013-10-17: 4 mg via INTRAMUSCULAR

## 2013-10-17 MED ORDER — HYDROMORPHONE HCL PF 4 MG/ML IJ SOLN
11.5000 mg | Freq: Once | INTRAMUSCULAR | Status: AC
  Administered 2013-10-17: 1.5 mg via INTRAMUSCULAR

## 2013-10-17 MED ORDER — IOHEXOL 300 MG/ML  SOLN
10.0000 mL | Freq: Once | INTRAMUSCULAR | Status: AC | PRN
  Administered 2013-10-17: 10 mL via INTRATHECAL

## 2013-10-17 MED ORDER — DIAZEPAM 5 MG PO TABS
10.0000 mg | ORAL_TABLET | Freq: Once | ORAL | Status: AC
  Administered 2013-10-17: 10 mg via ORAL

## 2013-10-17 MED ORDER — HYDROMORPHONE HCL PF 2 MG/ML IJ SOLN
1.5000 mg | Freq: Once | INTRAMUSCULAR | Status: AC
  Administered 2013-10-17: 1.5 mg via INTRAMUSCULAR

## 2013-10-17 NOTE — Discharge Instructions (Signed)
Myelogram Discharge Instructions  1. Go home and rest quietly for the next 24 hours.  It is important to lie flat for the next 24 hours.  Get up only to go to the restroom.  You may lie in the bed or on a couch on your back, your stomach, your left side or your right side.  You may have one pillow under your head.  You may have pillows between your knees while you are on your side or under your knees while you are on your back.  2. DO NOT drive today.  Recline the seat as far back as it will go, while still wearing your seat belt, on the way home.  3. You may get up to go to the bathroom as needed.  You may sit up for 10 minutes to eat.  You may resume your normal diet and medications unless otherwise indicated.  Drink plenty of extra fluids today and tomorrow.  4. The incidence of a spinal headache with nausea and/or vomiting is about 5% (one in 20 patients).  If you develop a headache, lie flat and drink plenty of fluids until the headache goes away.  Caffeinated beverages may be helpful.  If you develop severe nausea and vomiting or a headache that does not go away with flat bed rest, call [phone].  5. You may resume normal activities after your 24 hours of bed rest is over; however, do not exert yourself strongly or do any heavy lifting tomorrow.  6. Call your physician for a follow-up appointment.   You may resume Buspar, Cymbalta, Haldol and Trazodone on Tuesday, October 18, 2013 after 9:30a.m.

## 2013-10-17 NOTE — Progress Notes (Signed)
Patient states she has been off Buspar, Cymbalta, Haldol and Trazodone for the past two days.  jkl

## 2013-10-17 NOTE — Progress Notes (Signed)
Called patient back after she called me to report nausea and vomiting after cervical myelogram and receiving IM Dilaudid.  I told her I phoned in an order for Zofran/Ondansetron 8mg  SL q 8hrs (#10, no refills) to in [place] ([phone]).  She said she would send her daughter to pick it up and would call ([phone] if symptoms continue.  jkl

## 2013-10-18 NOTE — Progress Notes (Signed)
I called patient to check on her since she was vomiting at home yesterday after her cervical myelogram.  She states she last threw up at about 22:30 last night.  She denies any more nausea and vomiting but reports having diarrhea now.  Denies headache at present but knows to repeat strict bedrest for 24 hours if spinal headache occurs.  She has begun taking some of the four medications we had her stop for two days before and the day of the myelogram.  She takes the Buspar and Trazodone during the day (has resumed these) and the Haldol and Cymbalta at night (will resume tonight).  jkl

## 2013-10-21 ENCOUNTER — Ambulatory Visit: Payer: Medicare Other | Admitting: Family

## 2013-10-24 ENCOUNTER — Encounter: Payer: Self-pay | Admitting: Family

## 2013-10-24 ENCOUNTER — Ambulatory Visit (INDEPENDENT_AMBULATORY_CARE_PROVIDER_SITE_OTHER): Payer: Medicare Other | Admitting: Family

## 2013-10-24 VITALS — BP 129/77 | HR 69 | Temp 99.5°F | Ht 61.0 in | Wt 154.6 lb

## 2013-10-24 DIAGNOSIS — E559 Vitamin D deficiency, unspecified: Secondary | ICD-10-CM | POA: Diagnosis not present

## 2013-10-24 DIAGNOSIS — Z13228 Encounter for screening for other metabolic disorders: Secondary | ICD-10-CM | POA: Diagnosis not present

## 2013-10-24 DIAGNOSIS — R7309 Other abnormal glucose: Secondary | ICD-10-CM | POA: Diagnosis not present

## 2013-10-24 DIAGNOSIS — I10 Essential (primary) hypertension: Secondary | ICD-10-CM | POA: Diagnosis not present

## 2013-10-24 DIAGNOSIS — Z1321 Encounter for screening for nutritional disorder: Secondary | ICD-10-CM

## 2013-10-24 DIAGNOSIS — R739 Hyperglycemia, unspecified: Secondary | ICD-10-CM

## 2013-10-24 DIAGNOSIS — K2100 Gastro-esophageal reflux disease with esophagitis, without bleeding: Secondary | ICD-10-CM | POA: Diagnosis not present

## 2013-10-24 DIAGNOSIS — IMO0001 Reserved for inherently not codable concepts without codable children: Secondary | ICD-10-CM

## 2013-10-24 DIAGNOSIS — F411 Generalized anxiety disorder: Secondary | ICD-10-CM

## 2013-10-24 DIAGNOSIS — E785 Hyperlipidemia, unspecified: Secondary | ICD-10-CM

## 2013-10-24 DIAGNOSIS — G43001 Migraine without aura, not intractable, with status migrainosus: Secondary | ICD-10-CM

## 2013-10-24 DIAGNOSIS — F329 Major depressive disorder, single episode, unspecified: Secondary | ICD-10-CM

## 2013-10-24 DIAGNOSIS — F3289 Other specified depressive episodes: Secondary | ICD-10-CM

## 2013-10-24 DIAGNOSIS — Z130 Encounter for screening for diseases of the blood and blood-forming organs and certain disorders involving the immune mechanism: Secondary | ICD-10-CM | POA: Diagnosis not present

## 2013-10-24 LAB — POCT GLYCOSYLATED HEMOGLOBIN (HGB A1C): Hemoglobin A1C: 5.8

## 2013-10-24 MED ORDER — BUTALBITAL-APAP-CAFFEINE 50-325-40 MG PO TABS: ORAL_TABLET | ORAL | Status: DC

## 2013-10-24 MED ORDER — ESCITALOPRAM OXALATE 10 MG PO TABS: 10.0000 mg | ORAL_TABLET | Freq: Every day | ORAL | Status: DC

## 2013-10-24 NOTE — Progress Notes (Signed)
Subjective:    Patient ID: [name], female    DOB: [birth year], 58 y.o.   MRN: [id number]  Hyperlipidemia This is a chronic problem. The current episode started more than 1 year ago. The problem is uncontrolled. Recent lipid tests were reviewed and are high. She has no history of diabetes or hypothyroidism. Factors aggravating her hyperlipidemia include fatty foods. Pertinent negatives include no chest pain, leg pain, myalgias or shortness of breath. Current antihyperlipidemic treatment includes statins. The current treatment provides moderate improvement of lipids. Risk factors for coronary artery disease include dyslipidemia, hypertension and post-menopausal.  Hypertension This is a chronic problem. The current episode started more than 1 year ago. The problem has been resolved since onset. The problem is controlled. Associated symptoms include anxiety and headaches. Pertinent negatives include no chest pain, palpitations, peripheral edema or shortness of breath. Risk factors for coronary artery disease include dyslipidemia and post-menopausal state. Past treatments include beta blockers. The current treatment provides significant improvement. Hypertensive end-organ damage includes CAD/MI. There is no history of kidney disease, heart failure or a thyroid problem.  Anxiety Presents for follow-up visit. Symptoms include depressed mood, excessive worry, irritability and nervous/anxious behavior. Patient reports no chest pain, palpitations or shortness of breath. Symptoms occur most days. The severity of symptoms is moderate. The symptoms are aggravated by family issues.   Her past medical history is significant for anxiety/panic attacks and depression. There is no history of arrhythmia. Past treatments include benzodiazephines and SSRIs. The treatment provided mild relief.  Gastrophageal Reflux She reports no chest pain, no heartburn or no sore throat. This is a chronic problem. The  current episode started more than 1 year ago. The problem occurs occasionally. The problem has been waxing and waning. The symptoms are aggravated by certain foods. Pertinent negatives include no fatigue. She has tried a PPI for the symptoms. The treatment provided significant relief.  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 Pt states she has had 7 outbreaks in the last two months.  Pt states she is having a lot of stress with family issues and psychiatrist has taken pt off her Valium 10 mg.  * Pt's ortho MD writes her percocet rx for her. Pt states she is going to have to have neck surgery in the future. The neck is also causing her to have migraines. She states she takes Fioricet when these migraines occur.  States she has 3-4 migraines a week.    Review of Systems  Constitutional: Positive for irritability. Negative for fatigue.  HENT: Negative for sore throat.   Eyes: Negative.   Respiratory: Negative.  Negative for shortness of breath.   Cardiovascular: Negative.  Negative for chest pain and palpitations.  Gastrointestinal: Negative.  Negative for heartburn.  Endocrine: Negative.   Genitourinary: Negative.   Musculoskeletal: Negative.  Negative for myalgias.  Neurological: Positive for headaches.  Hematological: Negative.   Psychiatric/Behavioral: The patient is nervous/anxious.   All other systems reviewed and are negative.      Objective:   Physical Exam  Vitals reviewed. Constitutional: She is oriented to person, place, and time. She appears well-developed and well-nourished. No distress.  HENT:  Head: Normocephalic and atraumatic.  Right Ear: External ear normal.  Mouth/Throat: Oropharynx is clear and moist.  Eyes: Pupils are equal, round, and reactive to light.  Neck: Normal range of motion. Neck supple. No thyromegaly present.  Cardiovascular: Normal rate, regular rhythm, normal heart sounds and intact distal pulses.   No murmur heard.  Pulmonary/Chest:  Effort normal and breath sounds normal. No respiratory distress. She has no wheezes.  Abdominal: Soft. Bowel sounds are normal. She exhibits no distension. There is no tenderness.  Musculoskeletal: Normal range of motion. She exhibits no edema and no tenderness.  Neurological: She is alert and oriented to person, place, and time. She has normal reflexes. No cranial nerve deficit.  Skin: Skin is warm and dry.  Psychiatric: She has a normal mood and affect. Her behavior is normal. Judgment and thought content normal.      BP 129/77  Pulse 69  Temp(Src) 99.5 F (37.5 C) (Oral)  Ht _0  (1.549 m)  Wt 154 lb 9.6 oz (70.126 kg)  BMI 29.23 kg/m2     Assessment & Plan:  1. HYPERTENSION - CMP14+EGFR  2. Gastroesophageal reflux disease with esophagitis  3. FIBROMYALGIA  4. ANXIETY - escitalopram (LEXAPRO) 10 MG tablet; Take 1 tablet (10 mg total) by mouth daily.  Dispense: 30 tablet; Refill: 6  5. DEPRESSION - escitalopram (LEXAPRO) 10 MG tablet; Take 1 tablet (10 mg total) by mouth daily.  Dispense: 30 tablet; Refill: 6  6. DYSLIPIDEMIA - Lipid panel  7. Encounter for vitamin deficiency screening - Vit D  25 hydroxy (rtn osteoporosis monitoring)  8. Migraine without aura and with status migrainosus, not intractable - butalbital-acetaminophen-caffeine (FIORICET, ESGIC) 50-325-40 MG per tablet; TAKE 1 TABLET TWICE DAILY AS NEEDED FOR TENSION HEADACHE.  Dispense: 60 tablet; Refill: 0  9. Blood glucose elevated  - POCT glycosylated hemoglobin (Hb A1C)   Continue all meds Labs pending Health Maintenance reviewed Diet and exercise encouraged RTO 2 weeks for anxiety/depression  Evelina Dun, FNP

## 2013-10-24 NOTE — Patient Instructions (Addendum)
Health Maintenance Adopting a healthy lifestyle and getting preventive care can go a long way to promote health and wellness. Talk with your health care provider about what schedule of regular examinations is right for you. This is a good chance for you to check in with your provider about disease prevention and staying healthy. In between checkups, there are plenty of things you can do on your own. Experts have done a lot of research about which lifestyle changes and preventive measures are most likely to keep you healthy. Ask your health care provider for more information. WEIGHT AND DIET  Eat a healthy diet  Be sure to include plenty of vegetables, fruits, low-fat dairy products, and lean protein.  Do not eat a lot of foods high in solid fats, added sugars, or salt.  Get regular exercise. This is one of the most important things you can do for your health.  Most adults should exercise for at least 150 minutes each week. The exercise should increase your heart rate and make you sweat (moderate-intensity exercise).  Most adults should also do strengthening exercises at least twice a week. This is in addition to the moderate-intensity exercise.  Maintain a healthy weight  Body mass index (BMI) is a measurement that can be used to identify possible weight problems. It estimates body fat based on height and weight. Your health care provider can help determine your BMI and help you achieve or maintain a healthy weight.  For females 25 years of age and older:   A BMI below 18.5 is considered underweight.  A BMI of 18.5 to 24.9 is normal.  A BMI of 25 to 29.9 is considered overweight.  A BMI of 30 and above is considered obese.  Watch levels of cholesterol and blood lipids  You should start having your blood tested for lipids and cholesterol at 56 years of age, then have this test every 5 years.  You may need to have your cholesterol levels checked more often if:  Your lipid or  cholesterol levels are high.  You are older than 57 years of age.  You are at high risk for heart disease.  CANCER SCREENING   Lung Cancer  Lung cancer screening is recommended for adults 97-92 years old who are at high risk for lung cancer because of a history of smoking.  A yearly low-dose CT scan of the lungs is recommended for people who:  Currently smoke.  Have quit within the past 15 years.  Have at least a 30-pack-year history of smoking. A pack year is smoking an average of one pack of cigarettes a day for 1 year.  Yearly screening should continue until it has been 15 years since you quit.  Yearly screening should stop if you develop a health problem that would prevent you from having lung cancer treatment.  Breast Cancer  Practice breast self-awareness. This means understanding how your breasts normally appear and feel.  It also means doing regular breast self-exams. Let your health care provider know about any changes, no matter how small.  If you are in your 20s or 30s, you should have a clinical breast exam (CBE) by a health care provider every 1-3 years as part of a regular health exam.  If you are 76 or older, have a CBE every year. Also consider having a breast X-ray (mammogram) every year.  If you have a family history of breast cancer, talk to your health care provider about genetic screening.  If you are  at high risk for breast cancer, talk to your health care provider about having an MRI and a mammogram every year.  Breast cancer gene (BRCA) assessment is recommended for women who have family members with BRCA-related cancers. BRCA-related cancers include:  Breast.  Ovarian.  Tubal.  Peritoneal cancers.  Results of the assessment will determine the need for genetic counseling and BRCA1 and BRCA2 testing. Cervical Cancer Routine pelvic examinations to screen for cervical cancer are no longer recommended for nonpregnant women who are considered low  risk for cancer of the pelvic organs (ovaries, uterus, and vagina) and who do not have symptoms. A pelvic examination may be necessary if you have symptoms including those associated with pelvic infections. Ask your health care provider if a screening pelvic exam is right for you.   The Pap test is the screening test for cervical cancer for women who are considered at risk.  If you had a hysterectomy for a problem that was not cancer or a condition that could lead to cancer, then you no longer need Pap tests.  If you are older than 65 years, and you have had normal Pap tests for the past 10 years, you no longer need to have Pap tests.  If you have had past treatment for cervical cancer or a condition that could lead to cancer, you need Pap tests and screening for cancer for at least 20 years after your treatment.  If you no longer get a Pap test, assess your risk factors if they change (such as having a new sexual partner). This can affect whether you should start being screened again.  Some women have medical problems that increase their chance of getting cervical cancer. If this is the case for you, your health care provider may recommend more frequent screening and Pap tests.  The human papillomavirus (HPV) test is another test that may be used for cervical cancer screening. The HPV test looks for the virus that can cause cell changes in the cervix. The cells collected during the Pap test can be tested for HPV.  The HPV test can be used to screen women 30 years of age and older. Getting tested for HPV can extend the interval between normal Pap tests from three to five years.  An HPV test also should be used to screen women of any age who have unclear Pap test results.  After 57 years of age, women should have HPV testing as often as Pap tests.  Colorectal Cancer  This type of cancer can be detected and often prevented.  Routine colorectal cancer screening usually begins at 57 years of  age and continues through 57 years of age.  Your health care provider may recommend screening at an earlier age if you have risk factors for colon cancer.  Your health care provider may also recommend using home test kits to check for hidden blood in the stool.  A small camera at the end of a tube can be used to examine your colon directly (sigmoidoscopy or colonoscopy). This is done to check for the earliest forms of colorectal cancer.  Routine screening usually begins at age 50.  Direct examination of the colon should be repeated every 5-10 years through 57 years of age. However, you may need to be screened more often if early forms of precancerous polyps or small growths are found. Skin Cancer  Check your skin from head to toe regularly.  Tell your health care provider about any new moles or changes in   moles, especially if there is a change in a mole's shape or color.  Also tell your health care provider if you have a mole that is larger than the size of a pencil eraser.  Always use sunscreen. Apply sunscreen liberally and repeatedly throughout the day.  Protect yourself by wearing long sleeves, pants, a wide-brimmed hat, and sunglasses whenever you are outside. HEART DISEASE, DIABETES, AND HIGH BLOOD PRESSURE   Have your blood pressure checked at least every 1-2 years. High blood pressure causes heart disease and increases the risk of stroke.  If you are between 75 years and 42 years old, ask your health care provider if you should take aspirin to prevent strokes.  Have regular diabetes screenings. This involves taking a blood sample to check your fasting blood sugar level.  If you are at a normal weight and have a low risk for diabetes, have this test once every three years after 57 years of age.  If you are overweight and have a high risk for diabetes, consider being tested at a younger age or more often. PREVENTING INFECTION  Hepatitis B  If you have a higher risk for  hepatitis B, you should be screened for this virus. You are considered at high risk for hepatitis B if:  You were born in a country where hepatitis B is common. Ask your health care provider which countries are considered high risk.  Your parents were born in a high-risk country, and you have not been immunized against hepatitis B (hepatitis B vaccine).  You have HIV or AIDS.  You use needles to inject street drugs.  You live with someone who has hepatitis B.  You have had sex with someone who has hepatitis B.  You get hemodialysis treatment.  You take certain medicines for conditions, including cancer, organ transplantation, and autoimmune conditions. Hepatitis C  Blood testing is recommended for:  Everyone born from [birth year] through 1964.  Anyone with known risk factors for hepatitis C. Sexually transmitted infections (STIs)  You should be screened for sexually transmitted infections (STIs) including gonorrhea and chlamydia if:  You are sexually active and are younger than 57 years of age.  You are older than 57 years of age and your health care provider tells you that you are at risk for this type of infection.  Your sexual activity has changed since you were last screened and you are at an increased risk for chlamydia or gonorrhea. Ask your health care provider if you are at risk.  If you do not have HIV, but are at risk, it may be recommended that you take a prescription medicine daily to prevent HIV infection. This is called pre-exposure prophylaxis (PrEP). You are considered at risk if:  You are sexually active and do not regularly use condoms or know the HIV status of your partner(s).  You take drugs by injection.  You are sexually active with a partner who has HIV. Talk with your health care provider about whether you are at high risk of being infected with HIV. If you choose to begin PrEP, you should first be tested for HIV. You should then be tested every 3 months for  as long as you are taking PrEP.  PREGNANCY   If you are premenopausal and you may become pregnant, ask your health care provider about preconception counseling.  If you may become pregnant, take 400 to 800 micrograms (mcg) of folic acid every day.  If you want to prevent pregnancy, talk to your  health care provider about birth control (contraception). OSTEOPOROSIS AND MENOPAUSE   Osteoporosis is a disease in which the bones lose minerals and strength with aging. This can result in serious bone fractures. Your risk for osteoporosis can be identified using a bone density scan.  If you are 85 years of age or older, or if you are at risk for osteoporosis and fractures, ask your health care provider if you should be screened.  Ask your health care provider whether you should take a calcium or vitamin D supplement to lower your risk for osteoporosis.  Menopause may have certain physical symptoms and risks.  Hormone replacement therapy may reduce some of these symptoms and risks. Talk to your health care provider about whether hormone replacement therapy is right for you.  HOME CARE INSTRUCTIONS   Schedule regular health, dental, and eye exams.  Stay current with your immunizations.   Do not use any tobacco products including cigarettes, chewing tobacco, or electronic cigarettes.  If you are pregnant, do not drink alcohol.  If you are breastfeeding, limit how much and how often you drink alcohol.  Limit alcohol intake to no more than 1 drink per day for nonpregnant women. One drink equals 12 ounces of beer, 5 ounces of wine, or 1 ounces of hard liquor.  Do not use street drugs.  Do not share needles.  Ask your health care provider for help if you need support or information about quitting drugs.  Tell your health care provider if you often feel depressed.  Tell your health care provider if you have ever been abused or do not feel safe at home. Document Released: 09/30/2010  Document Revised: 08/01/2013 Document Reviewed: 02/16/2013 [facility] Patient Information 2015 [place], [place]. This information is not intended to replace advice given to you by your health care provider. Make sure you discuss any questions you have with your health care provider. Stress and Stress Management Stress is a normal reaction to life events. It is what you feel when life demands more than you are used to or more than you can handle. Some stress can be useful. For example, the stress reaction can help you catch the last bus of the day, study for a test, or meet a deadline at work. But stress that occurs too often or for too long can cause problems. It can affect your emotional health and interfere with relationships and normal daily activities. Too much stress can weaken your immune system and increase your risk for physical illness. If you already have a medical problem, stress can make it worse. CAUSES  All sorts of life events may cause stress. An event that causes stress for one person may not be stressful for another person. Major life events commonly cause stress. These may be positive or negative. Examples include losing your job, moving into a new home, getting married, having a baby, or losing a loved one. Less obvious life events may also cause stress, especially if they occur day after day or in combination. Examples include working long hours, driving in traffic, caring for children, being in debt, or being in a difficult relationship. SIGNS AND SYMPTOMS Stress may cause emotional symptoms including, the following:  Anxiety. This is feeling worried, afraid, on edge, overwhelmed, or out of control.  Anger. This is feeling irritated or impatient.  Depression. This is feeling sad, down, helpless, or guilty.  Difficulty focusing, remembering, or making decisions. Stress may cause physical symptoms, including the following:   Aches and pains. These  may affect your head, neck, back,  stomach, or other areas of your body.  Tight muscles or clenched jaw.  Low energy or trouble sleeping. Stress may cause unhealthy behaviors, including the following:   Eating to feel better (overeating) or skipping meals.  Sleeping too little, too much, or both.  Working too much or putting off tasks (procrastination).  Smoking, drinking alcohol, or using drugs to feel better. DIAGNOSIS  Stress is diagnosed through an assessment by your health care provider. Your health care provider will ask questions about your symptoms and any stressful life events.Your health care provider will also ask about your medical history and may order blood tests or other tests. Certain medical conditions and medicine can cause physical symptoms similar to stress. Mental illness can cause emotional symptoms and unhealthy behaviors similar to stress. Your health care provider may refer you to a mental health professional for further evaluation.  TREATMENT  Stress management is the recommended treatment for stress.The goals of stress management are reducing stressful life events and coping with stress in healthy ways.  Techniques for reducing stressful life events include the following:  Stress identification. Self-monitor for stress and identify what causes stress for you. These skills may help you to avoid some stressful events.  Time management. Set your priorities, keep a calendar of events, and learn to say "no." These tools can help you avoid making too many commitments. Techniques for coping with stress include the following:  Rethinking the problem. Try to think realistically about stressful events rather than ignoring them or overreacting. Try to find the positives in a stressful situation rather than focusing on the negatives.  Exercise. Physical exercise can release both physical and emotional tension. The key is to find a form of exercise you enjoy and do it regularly.  Relaxation techniques.  These relax the body and mind. Examples include yoga, meditation, tai chi, biofeedback, deep breathing, progressive muscle relaxation, listening to music, being out in nature, journaling, and other hobbies. Again, the key is to find one or more that you enjoy and can do regularly.  Healthy lifestyle. Eat a balanced diet, get plenty of sleep, and do not smoke. Avoid using alcohol or drugs to relax.  Strong support network. Spend time with family, friends, or other people you enjoy being around.Express your feelings and talk things over with someone you trust. Counseling or talktherapy with a mental health professional may be helpful if you are having difficulty managing stress on your own. Medicine is typically not recommended for the treatment of stress.Talk to your health care provider if you think you need medicine for symptoms of stress. HOME CARE INSTRUCTIONS  Keep all follow-up visits as directed by your health care provider.  Take all medicines as directed by your health care provider. SEEK MEDICAL CARE IF:  Your symptoms get worse or you start having new symptoms.  You feel overwhelmed by your problems and can no longer manage them on your own. SEEK IMMEDIATE MEDICAL CARE IF:  You feel like hurting yourself or someone else. Document Released: 09/10/2000 Document Revised: 08/01/2013 Document Reviewed: 11/09/2012 [facility] Patient Information 2015 [place], [place]. This information is not intended to replace advice given to you by your health care provider. Make sure you discuss any questions you have with your health care provider.

## 2013-10-25 ENCOUNTER — Telehealth: Payer: Self-pay | Admitting: Family Medicine

## 2013-10-25 LAB — CMP14+EGFR
A/G RATIO: 1.8 (ref 1.1–2.5)
ALK PHOS: 121 IU/L — AB (ref 39–117)
ALT: 20 IU/L (ref 0–32)
AST: 19 IU/L (ref 0–40)
Albumin: 4.4 g/dL (ref 3.5–5.5)
BUN / CREAT RATIO: 15 (ref 9–23)
BUN: 12 mg/dL (ref 6–24)
CALCIUM: 9.8 mg/dL (ref 8.7–10.2)
CO2: 23 mmol/L (ref 18–29)
CREATININE: 0.81 mg/dL (ref 0.57–1.00)
Chloride: 101 mmol/L (ref 97–108)
GFR calc Af Amer: 93 mL/min/{1.73_m2} (ref >59)
GFR calc non Af Amer: 81 mL/min/{1.73_m2} (ref >59)
GLOBULIN, TOTAL: 2.4 g/dL (ref 1.5–4.5)
Glucose: 103 mg/dL — ABNORMAL HIGH (ref 65–99)
POTASSIUM: 4.4 mmol/L (ref 3.5–5.2)
SODIUM: 142 mmol/L (ref 134–144)
Total Bilirubin: <0.2 mg/dL (ref 0.0–1.2)
Total Protein: 6.8 g/dL (ref 6.0–8.5)

## 2013-10-25 LAB — LIPID PANEL
CHOL/HDL RATIO: 2.9 ratio (ref 0.0–4.4)
Cholesterol, Total: 187 mg/dL (ref 100–199)
HDL: 65 mg/dL (ref >39)
LDL Calculated: 102 mg/dL — ABNORMAL HIGH (ref 0–99)
Triglycerides: 100 mg/dL (ref 0–149)
VLDL Cholesterol Cal: 20 mg/dL (ref 5–40)

## 2013-10-25 LAB — VITAMIN D 25 HYDROXY (VIT D DEFICIENCY, FRACTURES): VIT D 25 HYDROXY: 57 ng/mL (ref 30.0–100.0)

## 2013-10-25 NOTE — Telephone Encounter (Signed)
Message copied by Azalee Course on Tue Oct 25, 2013  9:16 AM ------      Message from: HAWKS, [place] A      Created: Tue Oct 25, 2013  9:00 AM       Hgb A1 WNL- Not a diabetic      Kidney and liver function stable      Cholesterol WNL      Vit D levels WNL       ------

## 2013-10-26 DIAGNOSIS — M5412 Radiculopathy, cervical region: Secondary | ICD-10-CM | POA: Diagnosis not present

## 2013-10-26 DIAGNOSIS — M47812 Spondylosis without myelopathy or radiculopathy, cervical region: Secondary | ICD-10-CM | POA: Diagnosis not present

## 2013-11-04 ENCOUNTER — Telehealth: Payer: Self-pay | Admitting: Internal Medicine

## 2013-11-04 NOTE — Telephone Encounter (Signed)
Received request from Nurse fax box:   To: [employer] number: [phone] Attention: Charlton Amor

## 2013-11-08 ENCOUNTER — Telehealth: Payer: Self-pay | Admitting: Family Medicine

## 2013-11-08 ENCOUNTER — Other Ambulatory Visit: Payer: Self-pay | Admitting: Family

## 2013-11-08 ENCOUNTER — Ambulatory Visit (INDEPENDENT_AMBULATORY_CARE_PROVIDER_SITE_OTHER): Payer: Medicare Other | Admitting: Family

## 2013-11-08 ENCOUNTER — Encounter: Payer: Self-pay | Admitting: Family

## 2013-11-08 VITALS — BP 98/68 | HR 60 | Temp 99.4°F | Ht 61.0 in | Wt 157.2 lb

## 2013-11-08 DIAGNOSIS — F411 Generalized anxiety disorder: Secondary | ICD-10-CM

## 2013-11-08 DIAGNOSIS — Z419 Encounter for procedure for purposes other than remedying health state, unspecified: Secondary | ICD-10-CM

## 2013-11-08 DIAGNOSIS — F3289 Other specified depressive episodes: Secondary | ICD-10-CM | POA: Diagnosis not present

## 2013-11-08 DIAGNOSIS — F329 Major depressive disorder, single episode, unspecified: Secondary | ICD-10-CM | POA: Diagnosis not present

## 2013-11-08 LAB — POCT CBC
Granulocyte percent: 63.6 %G (ref 37–80)
HCT, POC: 38.1 % (ref 37.7–47.9)
Hemoglobin: 12.3 g/dL (ref 12.2–16.2)
LYMPH, POC: 2.3 (ref 0.6–3.4)
MCH, POC: 27.9 pg (ref 27–31.2)
MCHC: 32.3 g/dL (ref 31.8–35.4)
MCV: 86.4 fL (ref 80–97)
MPV: 9.3 fL (ref 0–99.8)
POC Granulocyte: 4.8 (ref 2–6.9)
POC LYMPH PERCENT: 31.2 %L (ref 10–50)
Platelet Count, POC: 166 10*3/uL (ref 142–424)
RBC: 4.4 M/uL (ref 4.04–5.48)
RDW, POC: 15.7 %
WBC: 7.5 10*3/uL (ref 4.6–10.2)

## 2013-11-08 MED ORDER — ESCITALOPRAM OXALATE 20 MG PO TABS: 20.0000 mg | ORAL_TABLET | Freq: Every day | ORAL | Status: DC

## 2013-11-08 MED ORDER — DIAZEPAM 5 MG PO TABS: 5.0000 mg | ORAL_TABLET | Freq: Two times a day (BID) | ORAL | Status: DC | PRN

## 2013-11-08 NOTE — Telephone Encounter (Signed)
Pt aware of lab result and surgical clearance faxed.

## 2013-11-08 NOTE — Patient Instructions (Signed)
Generalized Anxiety Disorder Generalized anxiety disorder (GAD) is a mental disorder. It interferes with life functions, including relationships, work, and school. GAD is different from normal anxiety, which everyone experiences at some point in their lives in response to specific life events and activities. Normal anxiety actually helps us prepare for and get through these life events and activities. Normal anxiety goes away after the event or activity is over.  GAD causes anxiety that is not necessarily related to specific events or activities. It also causes excess anxiety in proportion to specific events or activities. The anxiety associated with GAD is also difficult to control. GAD can vary from mild to severe. People with severe GAD can have intense waves of anxiety with physical symptoms (panic attacks).  SYMPTOMS The anxiety and worry associated with GAD are difficult to control. This anxiety and worry are related to many life events and activities and also occur more days than not for 6 months or longer. People with GAD also have three or more of the following symptoms (one or more in children):  Restlessness.   Fatigue.  Difficulty concentrating.   Irritability.  Muscle tension.  Difficulty sleeping or unsatisfying sleep. DIAGNOSIS GAD is diagnosed through an assessment by your health care provider. Your health care provider will ask you questions aboutyour mood,physical symptoms, and events in your life. Your health care provider may ask you about your medical history and use of alcohol or drugs, including prescription medicines. Your health care provider may also do a physical exam and blood tests. Certain medical conditions and the use of certain substances can cause symptoms similar to those associated with GAD. Your health care provider may refer you to a mental health specialist for further evaluation. TREATMENT The following therapies are usually used to treat GAD:    Medication. Antidepressant medication usually is prescribed for long-term daily control. Antianxiety medicines may be added in severe cases, especially when panic attacks occur.   Talk therapy (psychotherapy). Certain types of talk therapy can be helpful in treating GAD by providing support, education, and guidance. A form of talk therapy called cognitive behavioral therapy can teach you healthy ways to think about and react to daily life events and activities.  Stress managementtechniques. These include yoga, meditation, and exercise and can be very helpful when they are practiced regularly. A mental health specialist can help determine which treatment is best for you. Some people see improvement with one therapy. However, other people require a combination of therapies. Document Released: 07/12/2012 Document Revised: 08/01/2013 Document Reviewed: 07/12/2012 [employer] Patient Information 2015 [employer]. This information is not intended to replace advice given to you by your health care provider. Make sure you discuss any questions you have with your health care provider.  

## 2013-11-08 NOTE — Progress Notes (Signed)
   Subjective:    Patient ID: [name], female    DOB: 25-Sep-1955, 58 y.o.   MRN: [id number]  Anxiety Patient reports no palpitations or shortness of breath.     Pt presents to the office for follow-up on anxiety and depression. Pt was started on Lexapro 10 mg PO daily last visit. Pt reports feeling much better. Pt states she can't tell any side effects of the medications. Pt states she has only had one anxiety attack since starting on the medication. Pt states she has not had a herpes outbreak since starting this medications. States that every time she's got "worked up" or had "panic attacks" she has then had a herpes outbreak. Pt states she is scheduled to have neck surgery some time within the next couple of weeks.    Review of Systems  Constitutional: Negative.   HENT: Negative.   Eyes: Negative.   Respiratory: Negative.  Negative for shortness of breath.   Cardiovascular: Negative.  Negative for palpitations.  Gastrointestinal: Negative.   Endocrine: Negative.   Genitourinary: Negative.   Musculoskeletal: Negative.   Neurological: Negative.  Negative for headaches.  Hematological: Negative.   Psychiatric/Behavioral: Negative.   All other systems reviewed and are negative.      Objective:   Physical Exam  Vitals reviewed. Constitutional: She is oriented to person, place, and time. She appears well-developed and well-nourished. No distress.  Eyes: Pupils are equal, round, and reactive to light.  Neck: Normal range of motion. Neck supple. No thyromegaly present.  Cardiovascular: Normal rate, regular rhythm, normal heart sounds and intact distal pulses.   No murmur heard. Pulmonary/Chest: Effort normal and breath sounds normal. No respiratory distress. She has no wheezes.  Abdominal: Soft. Bowel sounds are normal. She exhibits no distension. There is no tenderness.  Musculoskeletal: Normal range of motion. She exhibits no edema and no tenderness.    Neurological: She is alert and oriented to person, place, and time. She has normal reflexes. No cranial nerve deficit.  Skin: Skin is warm and dry.  Psychiatric: She has a normal mood and affect. Her behavior is normal. Judgment and thought content normal.    BP 98/68  Pulse 60  Temp(Src) 99.4 F (37.4 C) (Oral)  Ht 5\' 1"  (1.549 m)  Wt 157 lb 3.2 oz (71.305 kg)  BMI 29.72 kg/m2       Assessment & Plan:  1. ANXIETY - Stress management  escitalopram (LEXAPRO) 20 MG tablet; Take 1 tablet (20 mg total) by mouth daily.  Dispense: 90 tablet; Refill: 3 - diazepam (VALIUM) 5 MG tablet; Take 1 tablet (5 mg total) by mouth every 12 (twelve) hours as needed for anxiety.  Dispense: 30 tablet; Refill: 1  2. DEPRESSION - escitalopram (LEXAPRO) 20 MG tablet; Take 1 tablet (20 mg total) by mouth daily.  Dispense: 90 tablet; Refill: 3 - diazepam (VALIUM) 5 MG tablet; Take 1 tablet (5 mg total) by mouth every 12 (twelve) hours as needed for anxiety.  Dispense: 30 tablet; Refill: 1  3. Surgery, elective - POCT CBC -After lab result will sign surgery clearance paperwork- Pt will need to be cleared from her Cardiologists    Continue all meds Labs pending Health Maintenance reviewed Diet and exercise encouraged RTO prn and follow-up in 6 months for chronic follow-up  , FNP

## 2013-11-08 NOTE — Telephone Encounter (Signed)
Message copied by Azalee Course on Tue Nov 08, 2013 11:43 AM ------      Message from: Jannifer Rodney A      Created: Tue Nov 08, 2013 11:37 AM       CBC WNL      Surgical Clearance faxed ------

## 2013-11-10 ENCOUNTER — Encounter: Payer: Self-pay | Admitting: Family Medicine

## 2013-11-11 ENCOUNTER — Other Ambulatory Visit: Payer: Self-pay | Admitting: Family

## 2013-11-11 DIAGNOSIS — Z419 Encounter for procedure for purposes other than remedying health state, unspecified: Secondary | ICD-10-CM

## 2013-11-12 ENCOUNTER — Telehealth: Payer: Self-pay | Admitting: Nurse Practitioner

## 2013-11-12 ENCOUNTER — Other Ambulatory Visit: Payer: Self-pay | Admitting: General Practice

## 2013-11-12 DIAGNOSIS — Z207 Contact with and (suspected) exposure to pediculosis, acariasis and other infestations: Secondary | ICD-10-CM

## 2013-11-12 DIAGNOSIS — Z2089 Contact with and (suspected) exposure to other communicable diseases: Secondary | ICD-10-CM

## 2013-11-12 MED ORDER — PERMETHRIN 5 % EX CREA: 1.0000 "application " | TOPICAL_CREAM | Freq: Once | CUTANEOUS | Status: DC

## 2013-11-12 NOTE — Telephone Encounter (Signed)
Please inform script sent to pharmacy for scabies exposure.

## 2013-11-12 NOTE — Telephone Encounter (Signed)
Patient aware rx sent to pharmacy.  

## 2013-11-18 ENCOUNTER — Other Ambulatory Visit (HOSPITAL_COMMUNITY): Payer: Self-pay | Admitting: Specialist

## 2013-11-21 ENCOUNTER — Other Ambulatory Visit: Payer: Self-pay | Admitting: Family

## 2013-11-21 ENCOUNTER — Other Ambulatory Visit: Payer: Self-pay | Admitting: Gastroenterology

## 2013-11-22 ENCOUNTER — Encounter: Payer: Self-pay | Admitting: Cardiology

## 2013-11-22 ENCOUNTER — Ambulatory Visit (INDEPENDENT_AMBULATORY_CARE_PROVIDER_SITE_OTHER): Payer: Medicare Other | Admitting: Cardiology

## 2013-11-22 VITALS — BP 128/78 | HR 72 | Ht 61.0 in | Wt 157.0 lb

## 2013-11-22 DIAGNOSIS — R55 Syncope and collapse: Secondary | ICD-10-CM

## 2013-11-22 DIAGNOSIS — I10 Essential (primary) hypertension: Secondary | ICD-10-CM

## 2013-11-22 MED ORDER — FLUDROCORTISONE ACETATE 0.1 MG PO TABS: 0.1000 mg | ORAL_TABLET | Freq: Every day | ORAL | Status: DC

## 2013-11-22 NOTE — Progress Notes (Signed)
Clinical Summary [name] is a 58 y.o.female last seen by Dr Ladona Ridgel, this is our first visit together. She is seen for the following medical problems.  1. 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  - last episode of syncope August 23rd. Occurred while standing in kitchen. Can feel lightheaded, and suddenly falls out. Occurs 2-3 times a month on average.   - though not on our med list she reports she is taking lasix daily as well  2. HTN - reports labile blood pressures at home, ranging from systolics 70s to 170s. The accuracy of her machine is unclear.    3. Preoperative cardiac evaluation - being considered for neck surgery - Limited physically due to joint pains. Highest level of activity is swimming x 30 minutes.     - reports cath  Past Medical History  Diagnosis Date  . Anxiety   . Herpes simplex infection   . Osteoporosis   . Sleep apnea   . Hypertension   . Dyslipidemia   . Depression   . Asthma   . Fibromyalgia   . GERD (gastroesophageal reflux disease)   . Head injury, unspecified   . Supraventricular tachycardia   . Syncope and collapse     s/p ILR; no arhythmogenic cause identified  . Coronary artery disease     reported hx of "MI";  Echo 2009 with normal LVF;  Myoview 05/2011: no ischemia  . Spondylolisthesis   . Spinal stenosis of lumbar region   . Hyperlipidemia   . Bipolar 1 disorder   . Insomnia   . Arthritis     RHEUMATOID  . Esophageal stricture      Allergies  Allergen Reactions  . Codeine Other (See Comments)    "I will have a heart attack."  . Morphine And Related Other (See Comments)    "It will cause me to have a heart attack."  . Lyrica [Pregabalin] Swelling    Weight gain   . Neurontin [Gabapentin] Swelling     Current Outpatient Prescriptions  Medication Sig Dispense Refill  . acyclovir (ZOVIRAX) 200 MG capsule Take 1 capsule (200 mg total) by mouth 5 (five) times daily.  30 capsule  1  . albuterol (PROVENTIL) (2.5 MG/3ML) 0.083% nebulizer solution Take 3 mLs (2.5 mg total) by nebulization every 6 (six) hours as needed for wheezing or shortness of breath.  150 mL  1  . atenolol (TENORMIN) 25 MG tablet Take 1 tablet (25 mg total) by mouth daily.      . baclofen (LIORESAL) 10 MG tablet Take 1 tablet (10 mg total) by mouth 3 (three) times daily.  90 each  2  . busPIRone (BUSPAR) 5 MG tablet       . butalbital-acetaminophen-caffeine (FIORICET, ESGIC) 50-325-40 MG per tablet TAKE 1 TABLET TWICE DAILY AS NEEDED FOR TENSION HEADACHE.  60 tablet  0  . CRESTOR 20 MG tablet TAKE (1/2) TABLET BY MOUTH DAILY.  15 tablet  0  . CYMBALTA 60 MG capsule Take 1 capsule by mouth at bedtime.      [name] DEXILANT 60 MG capsule TAKE 1 CAPSULE DAILY.  30 capsule  6  . diazepam (VALIUM) 5 MG tablet Take 1 tablet (5 mg total) by  mouth every 12 (twelve) hours as needed for anxiety.  30 tablet  1  . escitalopram (LEXAPRO) 20 MG tablet Take 1 tablet (20 mg total) by mouth daily.  90 tablet  3  . fludrocortisone (FLORINEF) 0.1 MG tablet       . haloperidol (HALDOL) 0.5 MG tablet       . levETIRAcetam (KEPPRA) 500 MG tablet       . nitroGLYCERIN (NITROLINGUAL) 0.4 MG/SPRAY spray USE (1) SPRAY UNDER TONGUE AS NEEDED FOR CHEST PAIN.  12 g  0  . omeprazole-sodium bicarbonate (ZEGERID) 40-1100 MG per capsule Take one tab before breakfast and at bedtime  60 capsule  3  . oxyCODONE-acetaminophen (PERCOCET/ROXICET) 5-325 MG per tablet       . permethrin (ACTICIN) 5 % cream Apply 1 application topically once. Apply thin coating from head to toe once, leave on 8-14 hours, then shower as normal to remove.  60 g  0  . PROAIR HFA 108 (90 BASE) MCG/ACT inhaler INHALE 2 PUFFS EVERY 6 HOURS AS NEEDED.  8.5 g   0  . ranitidine (ZANTAC) 150 MG tablet One at bedtime  30 tablet  11  . traZODone (DESYREL) 100 MG tablet Take 200 mg by mouth daily.        . valACYclovir (VALTREX) 500 MG tablet Take 1 tablet (500 mg total) by mouth 2 (two) times daily.  60 tablet  5   No current facility-administered medications for this visit.     Past Surgical History  Procedure Laterality Date  . Insertion of implatable loop recorder  08/11/2007    [name]  . Head up tilt table testing  06/15/2007    [name]  . Back surgery    . Doppler echocardiography  2009     Allergies  Allergen Reactions  . Codeine Other (See Comments)    "I will have a heart attack."  . Morphine And Related Other (See Comments)    "It will cause me to have a heart attack."  . Lyrica [Pregabalin] Swelling    Weight gain  . Neurontin [Gabapentin] Swelling      Family History  Problem Relation Age of Onset  . Heart attack Father   . Cancer Father   . Mental illness Father   . Heart attack Brother     stents  . Cancer Mother   . Mental illness Mother   . Colon cancer Maternal Aunt   . Stomach cancer Neg Hx      Social History [name] reports that she has never smoked. She has never used smokeless tobacco. [name] reports that she does not drink alcohol.   Review of Systems CONSTITUTIONAL: No weight loss, fever, chills, weakness or fatigue.  HEENT: Eyes: No visual loss, blurred vision, double vision or yellow sclerae.No hearing loss, sneezing, congestion, runny nose or sore throat.  SKIN: No rash or itching.  CARDIOVASCULAR: per HPI RESPIRATORY: No shortness of breath, cough or sputum.  GASTROINTESTINAL: No anorexia, nausea, vomiting or diarrhea. No abdominal pain or blood.  GENITOURINARY: No burning on urination, no polyuria NEUROLOGICAL: per HPI MUSCULOSKELETAL: No muscle, back pain, joint pain or stiffness.  LYMPHATICS: No enlarged nodes. No history of splenectomy.  PSYCHIATRIC: No history of  depression or anxiety.  ENDOCRINOLOGIC: No reports of sweating, cold or heat intolerance. No polyuria or polydipsia.  [name]   Physical Examination p 128/78 Wt 157 lbs BMI 29 Gen: resting comfortably, no acute distress HEENT: no scleral icterus, pupils equal  round and reactive, no palptable cervical adenopathy,  CV: RRR, no m/r/g, no JVD, no carotid bruits Resp: Clear to auscultation bilaterally GI: abdomen is soft, non-tender, non-distended, normal bowel sounds, no hepatosplenomegaly MSK: extremities are warm, no edema.  Skin: warm, no rash Neuro:  no focal deficits Psych: appropriate affect   Diagnostic Studies 05/2011 Lexiscan MPI No ischemia   Jan 2009 Echo LEFT VENTRICLE: - Left ventricular size was normal. - Overall left ventricular systolic function was normal. - There were no left ventricular regional wall motion abnormalities. - Left ventricular wall thickness was normal.  AORTIC VALVE: - The aortic valve was trileaflet. - Aortic valve thickness was normal.  AORTA: - The aortic root was normal in size. - The aortic arch was normal.  MITRAL VALVE: - Mitral valve structure was normal.  Doppler interpretation(s): - There was trivial mitral valvular regurgitation.  LEFT ATRIUM: - Left atrial size was normal.  PULMONARY VEINS: - The pulmonary veins were grossly normal.  RIGHT VENTRICLE: - Right ventricular size was normal. - Right ventricular systolic function was normal. - Right ventricular wall thickness was normal.  PULMONIC VALVE: - The structure of the pulmonic valve appeared to be normal.  TRICUSPID VALVE: - The tricuspid valve structure was normal.  Doppler interpretation(s): - There was no significant tricuspid valvular regurgitation.  PULMONARY ARTERY: - The pulmonary artery was normal size.  RIGHT ATRIUM: - Right atrial size was normal.  SYSTEMIC VEINS: - The inferior vena cava was normal.  PERICARDIUM: - There was no pericardial  effusion.  ---------------------------------------------------------------  SUMMARY - Overall left ventricular systolic function was normal. There were no left ventricular regional wall motion abnormalities. - The pulmonary veins were grossly normal.   09/2011 Cath Hemodynamic Findings:  Central aortic pressure: 108/58  Left ventricular pressure: 107/10/14  Angiographic Findings:  Left main: No obstructive disease noted.  Left Anterior Descending Artery: Moderate to large sized vessel that courses to the apex. There is a moderate sized diagonal branch. No obstructive disease noted.  Circumflex Artery: Large, dominant artery with moderate sized first obtuse marginal branch and left sided posterolateral branch with no disease noted.  Right Coronary Artery: Small, non-dominant vessel with no disease noted.  Left Ventricular Angiogram:LVEF=65%.  Impression:  1. No angiographic evidence of CAD  2. Normal LV systolic function  3. Non-cardiac chest pain  Recommendations: No further ischemic workup.  Complications: None. The patient tolerated the procedure well.    Assessment and Plan  1. Syncope - continues to have 2-3 episodes per month - from notes about loop recorder no coorelation has been noted with arrhythmia - thought to be vasodepressor syncope, she has been on florinef but only taking prn. She is also taking lasix which competes with function of florinef - asked to stop lasix, take daily florinef and monitor bp log x 2 weeks and submit - she will also continue to follow with Dr Ladona Ridgel  2. HTN - plan as described above - given her syncopal episodes higher baseline blood pressures are acceptable for her  3. Preoperative cardiac evaluation - prior cath 09/2011 with normal coronaries, LVEF 65%. She has not active acute cardiac conditions to prohibit her surgery. Recommend proceeding with surgery as planned.    F/ 46months    Antoine Poche, M.D., F.A.C.C.

## 2013-11-22 NOTE — Patient Instructions (Signed)
   Stop Atenolol  Change the Florinef to daily - new sent to pharm Continue all other medications.   Your physician has requested that you regularly monitor and record your blood pressure readings at home. Please take approximately 2 hours after medication x 2 weeks & return to office for MD review.   Follow up with Dr. Ladona Ridgel - [place] office Follow up in  3 months

## 2013-11-23 ENCOUNTER — Other Ambulatory Visit: Payer: Self-pay

## 2013-11-23 NOTE — Telephone Encounter (Signed)
Last filled 10/24/13, last seen 11/08/13

## 2013-11-23 NOTE — Telephone Encounter (Signed)
Pt notified to pickup RX. 

## 2013-11-24 ENCOUNTER — Telehealth: Payer: Self-pay | Admitting: Internal Medicine

## 2013-11-24 ENCOUNTER — Ambulatory Visit (INDEPENDENT_AMBULATORY_CARE_PROVIDER_SITE_OTHER): Payer: Medicare Other | Admitting: Internal Medicine

## 2013-11-24 ENCOUNTER — Encounter: Payer: Self-pay | Admitting: Internal Medicine

## 2013-11-24 VITALS — BP 122/66 | HR 53 | Ht 61.0 in | Wt 158.4 lb

## 2013-11-24 DIAGNOSIS — R000 Tachycardia, unspecified: Secondary | ICD-10-CM

## 2013-11-24 DIAGNOSIS — R55 Syncope and collapse: Secondary | ICD-10-CM | POA: Diagnosis not present

## 2013-11-24 DIAGNOSIS — I498 Other specified cardiac arrhythmias: Secondary | ICD-10-CM

## 2013-11-24 DIAGNOSIS — Z01810 Encounter for preprocedural cardiovascular examination: Secondary | ICD-10-CM | POA: Diagnosis not present

## 2013-11-24 MED ORDER — MIDODRINE HCL 5 MG PO TABS: ORAL_TABLET | ORAL | Status: DC

## 2013-11-24 NOTE — Patient Instructions (Signed)
Your physician wants you to follow-up in: 6 months with Dr Court Joy will receive a reminder letter in the mail two months in advance. If you don't receive a letter, please call our office to schedule the follow-up appointment.  Your physician has recommended you make the following change in your medication:  1) Start Midodrine 5mg   Take one by mouth up to 4 times a day for BP less than 110

## 2013-11-24 NOTE — Assessment & Plan Note (Signed)
The patient is low risk for major cardiac complications from surgery. She may proceed.

## 2013-11-24 NOTE — Telephone Encounter (Signed)
Received request from Nurse fax box, documents faxed for surgical clearance. To: [employer] number: [phone] Attention: [phone]/km

## 2013-11-24 NOTE — Progress Notes (Signed)
HPI [name] returns today for followup. She is a 58 year old woman with a history of recurrent syncope, hypertension, and palpitations.  She has severe autonomic dysfunction. Her blood pressures routinely go into the 70's. At other times they go up to 170. She is pending neck surgery.  Allergies  Allergen Reactions  . Codeine Other (See Comments)    "I will have a heart attack."  . Morphine And Related Other (See Comments)    "It will cause me to have a heart attack."  . Lyrica [Pregabalin] Swelling    Weight gain  . Neurontin [Gabapentin] Swelling     Current Outpatient Prescriptions  Medication Sig Dispense Refill  . acyclovir (ZOVIRAX) 200 MG capsule Take 1 capsule (200 mg total) by mouth 5 (five) times daily.  30 capsule  1  . albuterol (PROAIR HFA) 108 (90 BASE) MCG/ACT inhaler INHALE 2 PUFFS EVERY 6 HOURS AS NEEDED FOR WHEEZING      . albuterol (PROVENTIL) (2.5 MG/3ML) 0.083% nebulizer solution Take 3 mLs (2.5 mg total) by nebulization every 6 (six) hours as needed for wheezing or shortness of breath.  150 mL  1  . baclofen (LIORESAL) 10 MG tablet Take 1 tablet (10 mg total) by mouth 3 (three) times daily.  90 each  2  . busPIRone (BUSPAR) 5 MG tablet Take 5 mg by mouth 2 (two) times daily.       . butalbital-acetaminophen-caffeine (FIORICET, ESGIC) 50-325-40 MG per tablet TAKE 1 TABLET TWICE A DAY AS NEEDED FOR TENSION HEADACHE.  60 tablet  1  . CRESTOR 20 MG tablet TAKE (1/2) TABLET BY MOUTH DAILY.  15 tablet  0  . CYMBALTA 60 MG capsule Take 1 capsule by mouth at bedtime.      [name] DEXILANT 60 MG capsule TAKE 1 CAPSULE DAILY.  30 capsule  6  . diazepam (VALIUM) 5 MG tablet Take 1 tablet (5 mg total) by mouth every 12 (twelve) hours as needed for anxiety.  30 tablet  1  . escitalopram (LEXAPRO) 20 MG tablet Take 1 tablet (20 mg total) by mouth daily.  90 tablet  3  . fludrocortisone (FLORINEF) 0.1 MG tablet Take 1 tablet (0.1 mg total) by mouth daily.  30 tablet  6  . haloperidol  (HALDOL) 0.5 MG tablet Take 0.5 mg by mouth 2 (two) times daily.       [name] levETIRAcetam (KEPPRA) 500 MG tablet Take 500 mg by mouth. Patient is currently not taking. (11/22/13)      . nitroGLYCERIN (NITROLINGUAL) 0.4 MG/SPRAY spray USE (1) SPRAY UNDER TONGUE AS NEEDED FOR CHEST PAIN.  12 g  0  . omeprazole-sodium bicarbonate (ZEGERID) 40-1100 MG per capsule Take one tab before breakfast and at bedtime  60 capsule  3  . oxyCODONE-acetaminophen (PERCOCET/ROXICET) 5-325 MG per tablet Take 1 tablet by mouth every 4 (four) hours as needed for severe pain.       . ranitidine (ZANTAC) 150 MG tablet One at bedtime  30 tablet  11  . traZODone (DESYREL) 100 MG tablet Take 200 mg by mouth daily.        . valACYclovir (VALTREX) 500 MG tablet Take 1 tablet (500 mg total) by mouth 2 (two) times daily.  60 tablet  5  . midodrine (PROAMATINE) 5 MG tablet Take one by mouth up to 4 times daily for BP<110 as needed  120 tablet  6   No current facility-administered medications for this visit.     Past Medical History  Diagnosis Date  . Anxiety   . Herpes simplex infection   . Osteoporosis   . Sleep apnea   . Hypertension   . Dyslipidemia   . Depression   . Asthma   . Fibromyalgia   . GERD (gastroesophageal reflux disease)   . Head injury, unspecified   . Supraventricular tachycardia   . Syncope and collapse     s/p ILR; no arhythmogenic cause identified  . Coronary artery disease     reported hx of "MI";  Echo 2009 with normal LVF;  Myoview 05/2011: no ischemia  . Spondylolisthesis   . Spinal stenosis of lumbar region   . Hyperlipidemia   . Bipolar 1 disorder   . Insomnia   . Arthritis     RHEUMATOID  . Esophageal stricture     ROS:   All systems reviewed and negative except as noted in the HPI.   Past Surgical History  Procedure Laterality Date  . Insertion of implatable loop recorder  08/11/2007    [name]  . Head up tilt table testing  06/15/2007    [name]  . Back surgery     . Doppler echocardiography  2009     Family History  Problem Relation Age of Onset  . Heart attack Father   . Cancer Father   . Mental illness Father   . Heart attack Brother     stents  . Cancer Mother   . Mental illness Mother   . Colon cancer Maternal Aunt   . Stomach cancer Neg Hx      History   Social History  . Marital Status: Single    Spouse Name: [name]    Number of Children: [name]  . Years of Education: [name]   Occupational History  . Not on file.   Social History Main Topics  . Smoking status: Never Smoker   . Smokeless tobacco: Never Used  . Alcohol Use: No  . Drug Use: No  . Sexual Activity: Not on file   Other Topics Concern  . Not on file   Social History Narrative   Patient lives at home with her brother.   Patient is on disability.    Patient has 8th grade education.    Patient has 6 children, 20 grand-chilren, and 3 great-grand children.      BP 122/66  Pulse 53  Ht 5\' 1"  (1.549 m)  Wt 158 lb 6.4 oz (71.85 kg)  BMI 29.94 kg/m2  Physical Exam:  Well appearing middle-aged woman,NAD HEENT: Unremarkable Neck:  No JVD, no thyromegally Back:  No CVA tenderness Lungs:  Clear with no wheezes, rales, or rhonchi. HEART:  Regular rate rhythm, grade 1/6 systolic murmur, no rubs, no clicks Abd:  soft, positive bowel sounds, no organomegally, no rebound, no guarding Ext:  2 plus pulses, no edema, no cyanosis, no clubbing Skin:  No rashes no nodules Neuro:  CN II through XII intact, motor grossly intact   Assess/Plan:

## 2013-11-24 NOTE — Assessment & Plan Note (Signed)
She has autonomic dysfunction. I have asked her to take as needed midodrine in addition to florinef. No indication currently for PPM.

## 2013-12-06 DIAGNOSIS — F4310 Post-traumatic stress disorder, unspecified: Secondary | ICD-10-CM | POA: Diagnosis not present

## 2013-12-08 ENCOUNTER — Other Ambulatory Visit: Payer: Self-pay | Admitting: Nurse Practitioner

## 2013-12-10 ENCOUNTER — Ambulatory Visit (INDEPENDENT_AMBULATORY_CARE_PROVIDER_SITE_OTHER): Payer: Medicare Other | Admitting: Nurse Practitioner

## 2013-12-10 VITALS — BP 157/85 | HR 72 | Temp 98.3°F | Ht 61.0 in | Wt 161.0 lb

## 2013-12-10 DIAGNOSIS — J4531 Mild persistent asthma with (acute) exacerbation: Secondary | ICD-10-CM

## 2013-12-10 DIAGNOSIS — J45901 Unspecified asthma with (acute) exacerbation: Secondary | ICD-10-CM

## 2013-12-10 MED ORDER — HYDROCODONE-HOMATROPINE 5-1.5 MG/5ML PO SYRP: 5.0000 mL | ORAL_SOLUTION | Freq: Three times a day (TID) | ORAL | Status: DC | PRN

## 2013-12-10 MED ORDER — METHYLPREDNISOLONE ACETATE 80 MG/ML IJ SUSP
80.0000 mg | Freq: Once | INTRAMUSCULAR | Status: AC
  Administered 2013-12-10: 80 mg via INTRAMUSCULAR

## 2013-12-10 MED ORDER — AMOXICILLIN 875 MG PO TABS: 875.0000 mg | ORAL_TABLET | Freq: Two times a day (BID) | ORAL | Status: DC

## 2013-12-10 NOTE — Progress Notes (Signed)
Subjective:    Patient ID: [name], female    DOB: [birth year], 58 y.o.   MRN: [id number]  HPI Patient in c/o cough- SHe has a long history of asthma- Cough started over a week ago and she cannot seem to get rid of it- Is due to have surgery on her neck September 20,2015.    Review of Systems  Constitutional: Negative for fever and chills.  HENT: Positive for congestion and rhinorrhea. Negative for sinus pressure, sore throat, trouble swallowing and voice change.   Respiratory: Positive for cough (productive) and wheezing.   Cardiovascular: Negative.   Gastrointestinal: Negative.   Genitourinary: Negative.   Neurological: Negative.   Psychiatric/Behavioral: Negative.   All other systems reviewed and are negative.      Objective:   Physical Exam  Constitutional: She is oriented to person, place, and time. She appears well-developed and well-nourished.  HENT:  Right Ear: Hearing, tympanic membrane, external ear and ear canal normal.  Left Ear: Hearing, tympanic membrane, external ear and ear canal normal.  Nose: Mucosal edema and rhinorrhea present. Right sinus exhibits no maxillary sinus tenderness and no frontal sinus tenderness. Left sinus exhibits no maxillary sinus tenderness and no frontal sinus tenderness.  Mouth/Throat: Uvula is midline, oropharynx is clear and moist and mucous membranes are normal.  Eyes: Pupils are equal, round, and reactive to light.  Neck: Normal range of motion. Neck supple.  Cardiovascular: Normal rate, regular rhythm and normal heart sounds.   Pulmonary/Chest: Effort normal. She has wheezes (exp wheezes throughout).  Neurological: She is alert and oriented to person, place, and time.  Skin: Skin is warm and dry.  Psychiatric: She has a normal mood and affect. Her behavior is normal. Judgment and thought content normal.   BP 157/85  Pulse 72  Temp(Src) 98.3 F (36.8 C) (Oral)  Ht 5\' 1"  (1.549 m)  Wt 161 lb (73.029 kg)  BMI 30.44  kg/m2        Assessment & Plan:   1. Asthmatic bronchitis, mild persistent, with acute exacerbation    Meds ordered this encounter  Medications  . methylPREDNISolone acetate (DEPO-MEDROL) injection 80 mg    Sig:   . amoxicillin (AMOXIL) 875 MG tablet    Sig: Take 1 tablet (875 mg total) by mouth 2 (two) times daily.    Dispense:  20 tablet    Refill:  0    Order Specific Question:  Supervising Provider    Answer:  [[id number]]  . HYDROcodone-homatropine (HYCODAN) 5-1.5 MG/5ML syrup    Sig: Take 5 mLs by mouth every 8 (eight) hours as needed for cough.    Dispense:  120 mL    Refill:  0    Order Specific Question:  Supervising Provider    Answer:  Ernestina Penna, DONALD W [[id number]]   *PATIENT SAYS THAT SHE IS NOT ALLERGIC TO HYCODAN- PATIENT REQUESTED THIS 1. Take meds as prescribed 2. Use a cool mist humidifier especially during the winter months and when heat has been humid. 3. Use saline nose sprays frequently 4. Saline irrigations of the nose can be very helpful if done frequently.  * 4X daily for 1 week*  * Use of a nettie pot can be helpful with this. Follow directions with this* 5. Drink plenty of fluids 6. Keep thermostat turn down low 7.For any cough or congestion  Use plain Mucinex- regular strength or max strength is fine   * Children- consult with Pharmacist for dosing 8. For  fever or aces or pains- take tylenol or ibuprofen appropriate for age and weight.  * for fevers greater than 101 orally you may alternate ibuprofen and tylenol every  3 hours.   [name] Daphine Deutscher, FNP

## 2013-12-10 NOTE — Patient Instructions (Signed)

## 2013-12-12 ENCOUNTER — Encounter (HOSPITAL_COMMUNITY)
Admission: RE | Admit: 2013-12-12 | Discharge: 2013-12-12 | Disposition: A | Discharge status: Home / Self Care | Payer: Medicare Other | Source: Ambulatory Visit | Attending: Specialist | Admitting: Specialist

## 2013-12-12 ENCOUNTER — Encounter (HOSPITAL_COMMUNITY): Payer: Self-pay

## 2013-12-12 ENCOUNTER — Ambulatory Visit (HOSPITAL_COMMUNITY)
Admission: RE | Admit: 2013-12-12 | Discharge: 2013-12-12 | Disposition: A | Discharge status: Home / Self Care | Payer: Medicare Other | Source: Ambulatory Visit | Attending: Anesthesiology | Admitting: Anesthesiology

## 2013-12-12 DIAGNOSIS — J45909 Unspecified asthma, uncomplicated: Secondary | ICD-10-CM | POA: Diagnosis not present

## 2013-12-12 DIAGNOSIS — R059 Cough, unspecified: Secondary | ICD-10-CM | POA: Diagnosis not present

## 2013-12-12 DIAGNOSIS — R05 Cough: Secondary | ICD-10-CM | POA: Insufficient documentation

## 2013-12-12 DIAGNOSIS — J984 Other disorders of lung: Secondary | ICD-10-CM | POA: Diagnosis not present

## 2013-12-12 DIAGNOSIS — J9819 Other pulmonary collapse: Secondary | ICD-10-CM | POA: Diagnosis not present

## 2013-12-12 HISTORY — DX: Unspecified convulsions: R56.9

## 2013-12-12 HISTORY — DX: Personal history of other diseases of the digestive system: Z87.19

## 2013-12-12 HISTORY — DX: Personal history of urinary calculi: Z87.442

## 2013-12-12 HISTORY — DX: Chronic obstructive pulmonary disease, unspecified: J44.9

## 2013-12-12 HISTORY — DX: Shortness of breath: R06.02

## 2013-12-12 HISTORY — DX: Acute myocardial infarction, unspecified: I21.9

## 2013-12-12 HISTORY — DX: Angina pectoris, unspecified: I20.9

## 2013-12-12 HISTORY — DX: Presence of other cardiac implants and grafts: Z95.818

## 2013-12-12 HISTORY — DX: Urinary tract infection, site not specified: N39.0

## 2013-12-12 LAB — SURGICAL PCR SCREEN
MRSA, PCR: NEGATIVE
Staphylococcus aureus: NEGATIVE

## 2013-12-12 LAB — CBC
HEMATOCRIT: 40.1 % (ref 36.0–46.0)
Hemoglobin: 12.7 g/dL (ref 12.0–15.0)
MCH: 27.2 pg (ref 26.0–34.0)
MCHC: 31.7 g/dL (ref 30.0–36.0)
MCV: 85.9 fL (ref 78.0–100.0)
Platelets: 219 10*3/uL (ref 150–400)
RBC: 4.67 MIL/uL (ref 3.87–5.11)
RDW: 14.5 % (ref 11.5–15.5)
WBC: 12.7 10*3/uL — ABNORMAL HIGH (ref 4.0–10.5)

## 2013-12-12 LAB — COMPREHENSIVE METABOLIC PANEL
ALK PHOS: 118 U/L — AB (ref 39–117)
ALT: 11 U/L (ref 0–35)
AST: 14 U/L (ref 0–37)
Albumin: 3.6 g/dL (ref 3.5–5.2)
Anion gap: 15 (ref 5–15)
BUN: 18 mg/dL (ref 6–23)
CALCIUM: 9.4 mg/dL (ref 8.4–10.5)
CO2: 22 mEq/L (ref 19–32)
CREATININE: 0.73 mg/dL (ref 0.50–1.10)
Chloride: 102 mEq/L (ref 96–112)
GFR calc non Af Amer: >90 mL/min (ref >90)
GLUCOSE: 80 mg/dL (ref 70–99)
Potassium: 4.2 mEq/L (ref 3.7–5.3)
Sodium: 139 mEq/L (ref 137–147)
TOTAL PROTEIN: 7.3 g/dL (ref 6.0–8.3)
Total Bilirubin: 0.2 mg/dL — ABNORMAL LOW (ref 0.3–1.2)

## 2013-12-12 NOTE — Pre-Procedure Instructions (Addendum)
[name]  12/12/2013   Your procedure is scheduled on:  9.21.15  Report to [facility] short stay admitting at 530 AM.  Call this number if you have problems the morning of surgery: [phone]   Remember:   Do not eat food or drink liquids after midnight.   Take these medicines the morning of surgery with A SIP OF WATER: haldol,, dexilant, acyclovir, inhaler,amoxicillin, baclofen, buspar, pain med nitro if needed    Take all meds as ordered until day of surgery except as instructed below or per dr  Despina Arias all herbel meds, nsaids (aleve,naproxen,advil,ibuprofen) 5 days prior to surgery including vitamins, aspirin   Do not wear jewelry, make-up or nail polish.  Do not wear lotions, powders, or perfumes. You may wear deodorant.  Do not shave 48 hours prior to surgery. Men may shave face and neck.  Do not bring valuables to the hospital.  [facility] is not responsible                  for any belongings or valuables.               Contacts, dentures or bridgework may not be worn into surgery.  Leave suitcase in the car. After surgery it may be brought to your room.  For patients admitted to the hospital, discharge time is determined by your                treatment team.               Patients discharged the day of surgery will not be allowed to drive  home.  Name and phone number of your driver:   Special Instructions:  Special Instructions: [place] - Preparing for Surgery  Before surgery, you can play an important role.  Because skin is not sterile, your skin needs to be as free of germs as possible.  You can reduce the number of germs on you skin by washing with CHG (chlorahexidine gluconate) soap before surgery.  CHG is an antiseptic cleaner which kills germs and bonds with the skin to continue killing germs even after washing.  Please DO NOT use if you have an allergy to CHG or antibacterial soaps.  If your skin becomes reddened/irritated stop using the CHG and inform  your nurse when you arrive at Short Stay.  Do not shave (including legs and underarms) for at least 48 hours prior to the first CHG shower.  You may shave your face.  Please follow these instructions carefully:   1.  Shower with CHG Soap the night before surgery and the morning of Surgery.  2.  If you choose to wash your hair, wash your hair first as usual with your normal shampoo.  3.  After you shampoo, rinse your hair and body thoroughly to remove the Shampoo.  4.  Use CHG as you would any other liquid soap.  You can apply chg directly  to the skin and wash gently with scrungie or a clean washcloth.  5.  Apply the CHG Soap to your body ONLY FROM THE NECK DOWN.  Do not use on open wounds or open sores.  Avoid contact with your eyes ears, mouth and genitals (private parts).  Wash genitals (private parts)       with your normal soap.  6.  Wash thoroughly, paying special attention to the area where your surgery will be performed.  7.  Thoroughly rinse your body with warm water from  the neck down.  8.  DO NOT shower/wash with your normal soap after using and rinsing off the CHG Soap.  9.  Pat yourself dry with a clean towel.            10.  Wear clean pajamas.            11.  Place clean sheets on your bed the night of your first shower and do not sleep with pets.  Day of Surgery  Do not apply any lotions/deodorants the morning of surgery.  Please wear clean clothes to the hospital/surgery center.   Please read over the following fact sheets that you were given: Pain Booklet, Coughing and Deep Breathing, MRSA Information and Surgical Site Infection Prevention

## 2013-12-12 NOTE — Progress Notes (Addendum)
Recent cold ,cough rx  Patient prescribed  keppra 2-3 months ago, for seizures-- never took didn't like side effects she read about online. Last seizure 3 weeks ago

## 2013-12-13 NOTE — Progress Notes (Signed)
Anesthesia Chart Review:  Patient is a 58 year old female scheduled for right C3-4, C4-5, C5-6 foraminotomies on 12/19/13 by Dr. Otelia Sergeant.    History includes non-smoker, severe autonomic dysfunction with recurrent syncope (SBP can range from 70's - 170's), HTN, palpitations, SVT, reported "MI" '09 (no CAD by 2013 cath), COPD, anxiety, depression, Bipolar one disorder, asthma, SOB, dyslipidemia, fibromyalgia, GERD, hiatal hernia, HSV, RA, nephrolithiasis, osteoporosis, possible seizures (prescribed Keppra 05/2013 but not taking due to fear of side effects, reported last seizure three weeks ago). She last saw EP cardiologist Dr. Ladona Ridgel on 11/24/13.  He added midodrine as needed in addition to florinef.  He did not feel that there was an indication for PPM at that time. He felt she was at low risk for major cardiac complications from surgery. Cardiologist Dr. Wyline Mood also cleared her for surgery. Notes indicate that syncope has not been correlated with any arrhythmia by loop recorder interrogation (device at end of life by 12/29/11 note). PCP is with [facility]. Jannifer Rodney, FNP cleared her from a medical standpoint.   She saw neurologist Dr. Elspeth Cho on 06/16/13 for loss of consciousness and concern of recurrent seizures. 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 His assessment on 06/16/13 states, "Ms Zuluaga is a pleasant 57y/o woman presenting for followup evaluation of episodes of loss of consciousness. Since last visit she has had ambulatory EEG monitoring which was unremarkable, though she states she had no events during this monitoring. During recent event she suffered head trauma. Patient has tracked BP during the events and notes systolic BP <70s during the event. Unclear if events are syncopal event due to hypotension or marked hypotension represents a post ictal phenomenon. Due to severe nature of events will start patient on Keppra  500mg  BID, can titrate up as needed. Patient will follow up with cardiology for further insight into BP fluctuations. If events continue would consider 2nd opinion with [facility] Epilepsy department for question of seizure vs non-epileptic event."  Patient reported in PAT that she is not taking Keppra due to the fear of side effects.    EKG on 11/24/13 showed SB at 53 bpm.  Cardiac cath on 10/06/11 showed no angiographic evidence of CAD, normal LV systolic function, non-cardiac chest pain.  Nuclear stress test on 06/05/11 was normal, EF 73%.  Echo on 04/22/07 showed:  - Overall left ventricular systolic function was normal. There were no left ventricular regional wall motion abnormalities. - The pulmonary veins were grossly normal. - Trivial MR.  CXR on 12/12/13 showed: Minimal bibasilar scarring and subsegmental atelectasis but no infiltrates, edema or effusions.  Preoperative labs noted.   I reviewed above with anesthesiologist Dr. 12/14/13. Her seizure history is unclear (syncope r/t hypotension or post-ictal state). He EEG earlier this year was negative. She has known autonomic dysfunction. She does not want to take Keppra.  She has since be evaluated by her PCP, cardiologist, and EP cardiologist and cleared for surgery from those standpoints.  Further evaluation by her assigned anesthesiologist on the day of surgery, but if no acute changes then it is anticipated that she can proceed as planned.    Katrinka Blazing [facility] Short Stay Center/Anesthesiology Phone [phone] 12/13/2013 3:34 PM

## 2013-12-15 NOTE — H&P (Signed)
[name] is an 58 y.o. female.   Chief Complaint: neck pain, left upper extremity pain HPI:   She is having mostly numbness in her left upper extremity.  She notes problems with persistent severe headaches radiating along the right side of her scalp which can be in keeping with a C4 nerve compression,.  She also has pain into the right shoulder and right arm which could be consistent with C5 and C6 radiculopathy down to the level of the right elbow.  She has an indwelling cardiac recorder and as this is present she has been advised that she should not undergo any type of MRI.  The results of the CT myelogram are reviewed.  The study was done on 10/17/2013.  It demonstrates right sided severe facet arthrosis changes associated with C3-4.  Right foraminal stenosis is present and is severe.  It due to a combination of anterior facet spurs and uncovertebral changes.  The left neural foramina appear patent.  At C4-5 she has severe disk degeneration and a shallow disk osteophyte complex.  The central canal is mildly narrowed.  There is severe right foraminal stenosis associated again with uncovertebral spurs and severe facet arthrosis changes.  The left side appears patent.  At C5-6 there is again a right eccentric disk osteophyte complex with severe right foraminal entrapment due to uncovertebral spurs and facet arthrosis, the left foramen showing no significant stenosis.  There is very mild central stenosis without cord deformity noted.  At C6-7 there is severe disk degeneration, a broad osteophyte disk complex.  The canal is patent.  Both neural foramen appear patent.  At C7-T1 there is severe left minimal right facet arthrosis with mild to moderate left facet changes and foraminal stenosis.  There is anterolisthesis present.  The right neural foramen appear patent.    ASSESSMENT:  Her findings are significant for multilevel spondylosis but the severe entrapment on the right side seems to suggest that  these 3 segments, C3-4, C4-5 and C5-6, are the source of her discomfort.  This patient has severe spondylosis with foraminal entrapment right C3-4, C4-5 and C5-6.  She is having ongoing problems with neck pain with extension and lateral rotation and bending.     PLAN:  She has undergone attempts at conservative treatment with physical therapy and cervical traction but the pain has persisted on a scale of 7 or 8/10.  She did not tolerate a traction program.  Her pain complaints continue and she is taking substantial amounts of narcotic medicines to try and relieve her discomfort.  We have tried a steroid dose pack.  We have also tried activity modification with no significant relief of her discomfort.  We will go ahead and schedule [name] to undergo a right sided 3-level foraminotomy.  The risks of surgery including the risks of infection, neurologic compromise and bleeding were discussed .   Past Medical History  Diagnosis Date  . Anxiety   . Herpes simplex infection   . Osteoporosis   . Hypertension   . Dyslipidemia   . Depression   . Asthma   . Fibromyalgia   . GERD (gastroesophageal reflux disease)   . Head injury, unspecified   . Supraventricular tachycardia   . Syncope and collapse     s/p ILR; no arhythmogenic cause identified  . Coronary artery disease     reported hx of "MI";  Echo 2009 with normal LVF;  Myoview 05/2011: no ischemia  . Spondylolisthesis   . Spinal stenosis of  lumbar region   . Hyperlipidemia   . Bipolar 1 disorder   . Insomnia   . Arthritis     RHEUMATOID  . Esophageal stricture   . Myocardial infarction   . Anginal pain     last time   . Status post placement of implantable loop recorder   . Sleep apnea     ? neg  . COPD (chronic obstructive pulmonary disease)   . Shortness of breath   . Pneumonia     hx  . History of kidney stones   . UTI (lower urinary tract infection)   . H/O hiatal hernia   . Seizures     last 3 weeks ago- prescribed keppra  -never took didnt like side effects read about online    Past Surgical History  Procedure Laterality Date  . Insertion of implatable loop recorder  08/11/2007    [name]  . Head up tilt table testing  06/15/2007    [name]  . Back surgery    . Doppler echocardiography  2009  . Tubal ligation    . Cholecystectomy    . Cystoscopy      stone  . Hemorrhoid surgery    . Breast surgery      lumpectomy    Family History  Problem Relation Age of Onset  . Heart attack Father   . Cancer Father   . Mental illness Father   . Heart attack Brother     stents  . Cancer Mother   . Mental illness Mother   . Colon cancer Maternal Aunt   . Stomach cancer Neg Hx    Social History:  reports that she has never smoked. She has never used smokeless tobacco. She reports that she does not drink alcohol or use illicit drugs.  Allergies:  Allergies  Allergen Reactions  . Codeine Other (See Comments)    "I will have a heart attack."  . Morphine And Related Other (See Comments)    "It will cause me to have a heart attack."  . Lyrica [Pregabalin] Swelling and Other (See Comments)    Weight gain  . Neurontin [Gabapentin] Swelling    Medications Prior to Admission  Medication Sig Dispense Refill  . acyclovir (ZOVIRAX) 200 MG capsule Take 1 capsule (200 mg total) by mouth 5 (five) times daily.  30 capsule  1  . albuterol (PROVENTIL HFA;VENTOLIN HFA) 108 (90 BASE) MCG/ACT inhaler Inhale 2 puffs into the lungs every 6 (six) hours as needed for wheezing or shortness of breath.      [name] albuterol (PROVENTIL) (2.5 MG/3ML) 0.083% nebulizer solution Take 3 mLs (2.5 mg total) by nebulization every 6 (six) hours as needed for wheezing or shortness of breath.  150 mL  1  . amoxicillin (AMOXIL) 875 MG tablet Take 1 tablet (875 mg total) by mouth 2 (two) times daily.  20 tablet  0  . baclofen (LIORESAL) 10 MG tablet Take 1 tablet (10 mg total) by mouth 3 (three) times daily.  90 each  2  . busPIRone  (BUSPAR) 5 MG tablet Take 5 mg by mouth 2 (two) times daily.       . butalbital-acetaminophen-caffeine (FIORICET, ESGIC) 50-325-40 MG per tablet TAKE 1 TABLET TWICE A DAY AS NEEDED FOR TENSION HEADACHE.  60 tablet  1  . CYMBALTA 60 MG capsule Take 60 mg by mouth at bedtime.       [name] dexlansoprazole (DEXILANT) 60 MG capsule Take 60 mg by mouth daily.      [name]  diazepam (VALIUM) 5 MG tablet Take 1 tablet (5 mg total) by mouth every 12 (twelve) hours as needed for anxiety.  30 tablet  1  . escitalopram (LEXAPRO) 20 MG tablet Take 1 tablet (20 mg total) by mouth daily.  90 tablet  3  . fludrocortisone (FLORINEF) 0.1 MG tablet Take 1 tablet (0.1 mg total) by mouth daily.  30 tablet  6  . haloperidol (HALDOL) 0.5 MG tablet Take 0.5 mg by mouth 2 (two) times daily.       [name] HYDROcodone-homatropine (HYCODAN) 5-1.5 MG/5ML syrup Take 5 mLs by mouth every 8 (eight) hours as needed for cough.  120 mL  0  . levETIRAcetam (KEPPRA) 500 MG tablet Take 500 mg by mouth. Patient is currently not taking. (11/22/13)      . midodrine (PROAMATINE) 5 MG tablet Take 5 mg by mouth 4 (four) times daily as needed (blood pressure lower than 110).      [name] oxyCODONE-acetaminophen (PERCOCET/ROXICET) 5-325 MG per tablet Take 1 tablet by mouth every 4 (four) hours as needed for severe pain.       . ranitidine (ZANTAC) 150 MG tablet Take 150 mg by mouth at bedtime.      . rosuvastatin (CRESTOR) 20 MG tablet Take 10 mg by mouth daily.      . traZODone (DESYREL) 100 MG tablet Take 200 mg by mouth daily.        . valACYclovir (VALTREX) 500 MG tablet Take 500 mg by mouth 2 (two) times daily.        No results found for this or any previous visit (from the past 48 hour(s)). No results found.  Review of Systems  Musculoskeletal: Positive for neck pain.  Neurological: Positive for headaches.    Blood pressure 113/71, pulse 51, temperature 98.7 F (37.1 C), temperature source Oral, resp. rate 20, height 5\' 1"  (1.549 m), weight 71.215 kg  (157 lb), SpO2 97.00%. Physical Exam  Constitutional: She is oriented to person, place, and time. She appears well-developed and well-nourished.  HENT:  Head: Normocephalic and atraumatic.  Eyes: EOM are normal. Pupils are equal, round, and reactive to light.  Cardiovascular: Normal rate and regular rhythm.   Respiratory: Effort normal and breath sounds normal.  GI: Soft.  Musculoskeletal:     She  has discomfort with extension of the neck and lateral bending and rotation to the right side greater than left.  She has limitations of lateral bending and rotation of the cervical spine to the right side. Positive spurling  Neurological: She is alert and oriented to person, place, and time.  Skin: Skin is warm and dry.  Psychiatric: She has a normal mood and affect.    Assessment/Plan: Severe spondylosis and right sided nerve compression C3-4, C4-5 and C5-6  Right sided foraminotomies C3-4, C4-5 and C5-6    NITKA,JAMES E 12/19/2013, 7:25 AM

## 2013-12-18 MED ORDER — CEFAZOLIN SODIUM-DEXTROSE 2-3 GM-% IV SOLR
2.0000 g | INTRAVENOUS | Status: AC
Start: 2013-12-19 — End: 2013-12-19
  Administered 2013-12-19: 2 g via INTRAVENOUS
  Filled 2013-12-18: qty 50

## 2013-12-19 ENCOUNTER — Encounter (HOSPITAL_COMMUNITY): Payer: Medicare Other | Admitting: Vascular Surgery

## 2013-12-19 ENCOUNTER — Inpatient Hospital Stay (HOSPITAL_COMMUNITY): Payer: Medicare Other | Admitting: Anesthesiology

## 2013-12-19 ENCOUNTER — Encounter (HOSPITAL_COMMUNITY)
Admission: RE | Disposition: A | Discharge status: Home / Self Care | Payer: Medicare Other | Source: Ambulatory Visit | Attending: Specialist

## 2013-12-19 ENCOUNTER — Encounter (HOSPITAL_COMMUNITY): Payer: Self-pay | Admitting: *Deleted

## 2013-12-19 ENCOUNTER — Inpatient Hospital Stay (HOSPITAL_COMMUNITY): Payer: Medicare Other

## 2013-12-19 ENCOUNTER — Observation Stay (HOSPITAL_COMMUNITY)
Admission: RE | Admit: 2013-12-19 | Discharge: 2013-12-20 | Disposition: A | Discharge status: Home / Self Care | Payer: Medicare Other | Source: Ambulatory Visit | Attending: Specialist | Admitting: Specialist

## 2013-12-19 DIAGNOSIS — Q762 Congenital spondylolisthesis: Secondary | ICD-10-CM | POA: Diagnosis not present

## 2013-12-19 DIAGNOSIS — M9981 Other biomechanical lesions of cervical region: Secondary | ICD-10-CM

## 2013-12-19 DIAGNOSIS — I10 Essential (primary) hypertension: Secondary | ICD-10-CM | POA: Insufficient documentation

## 2013-12-19 DIAGNOSIS — E785 Hyperlipidemia, unspecified: Secondary | ICD-10-CM | POA: Insufficient documentation

## 2013-12-19 DIAGNOSIS — M069 Rheumatoid arthritis, unspecified: Secondary | ICD-10-CM | POA: Diagnosis not present

## 2013-12-19 DIAGNOSIS — F319 Bipolar disorder, unspecified: Secondary | ICD-10-CM | POA: Diagnosis not present

## 2013-12-19 DIAGNOSIS — Z79899 Other long term (current) drug therapy: Secondary | ICD-10-CM | POA: Diagnosis not present

## 2013-12-19 DIAGNOSIS — M47812 Spondylosis without myelopathy or radiculopathy, cervical region: Secondary | ICD-10-CM | POA: Diagnosis not present

## 2013-12-19 DIAGNOSIS — J449 Chronic obstructive pulmonary disease, unspecified: Secondary | ICD-10-CM | POA: Diagnosis not present

## 2013-12-19 DIAGNOSIS — J4489 Other specified chronic obstructive pulmonary disease: Secondary | ICD-10-CM | POA: Diagnosis not present

## 2013-12-19 DIAGNOSIS — K219 Gastro-esophageal reflux disease without esophagitis: Secondary | ICD-10-CM | POA: Diagnosis not present

## 2013-12-19 DIAGNOSIS — R569 Unspecified convulsions: Secondary | ICD-10-CM | POA: Insufficient documentation

## 2013-12-19 DIAGNOSIS — M4802 Spinal stenosis, cervical region: Secondary | ICD-10-CM | POA: Diagnosis present

## 2013-12-19 DIAGNOSIS — A6000 Herpesviral infection of urogenital system, unspecified: Secondary | ICD-10-CM | POA: Diagnosis not present

## 2013-12-19 DIAGNOSIS — M1990 Unspecified osteoarthritis, unspecified site: Secondary | ICD-10-CM | POA: Diagnosis not present

## 2013-12-19 DIAGNOSIS — IMO0001 Reserved for inherently not codable concepts without codable children: Secondary | ICD-10-CM | POA: Diagnosis not present

## 2013-12-19 DIAGNOSIS — Z981 Arthrodesis status: Secondary | ICD-10-CM | POA: Diagnosis not present

## 2013-12-19 HISTORY — PX: POSTERIOR CERVICAL FUSION/FORAMINOTOMY: SHX5038

## 2013-12-19 SURGERY — POSTERIOR CERVICAL FUSION/FORAMINOTOMY LEVEL 2
Anesthesia: General

## 2013-12-19 MED ORDER — OXYCODONE HCL 5 MG PO TABS
5.0000 mg | ORAL_TABLET | Freq: Once | ORAL | Status: AC | PRN
  Administered 2013-12-19: 5 mg via ORAL

## 2013-12-19 MED ORDER — ATORVASTATIN CALCIUM 40 MG PO TABS
40.0000 mg | ORAL_TABLET | Freq: Every day | ORAL | Status: DC
  Administered 2013-12-19: 40 mg via ORAL
  Filled 2013-12-19 (×2): qty 1

## 2013-12-19 MED ORDER — VALACYCLOVIR HCL 500 MG PO TABS
500.0000 mg | ORAL_TABLET | Freq: Two times a day (BID) | ORAL | Status: DC
  Administered 2013-12-19 – 2013-12-20 (×2): 500 mg via ORAL
  Filled 2013-12-19 (×3): qty 1

## 2013-12-19 MED ORDER — OXYCODONE-ACETAMINOPHEN 5-325 MG PO TABS: 1.0000 | ORAL_TABLET | ORAL | Status: DC | PRN

## 2013-12-19 MED ORDER — METHOCARBAMOL 500 MG PO TABS
500.0000 mg | ORAL_TABLET | Freq: Four times a day (QID) | ORAL | Status: DC | PRN
  Administered 2013-12-19 – 2013-12-20 (×4): 500 mg via ORAL
  Filled 2013-12-19 (×4): qty 1

## 2013-12-19 MED ORDER — FENTANYL CITRATE 0.05 MG/ML IJ SOLN
INTRAMUSCULAR | Status: DC | PRN
  Administered 2013-12-19 (×5): 50 ug via INTRAVENOUS

## 2013-12-19 MED ORDER — GLYCOPYRROLATE 0.2 MG/ML IJ SOLN
INTRAMUSCULAR | Status: AC
  Filled 2013-12-19: qty 3

## 2013-12-19 MED ORDER — ROCURONIUM BROMIDE 50 MG/5ML IV SOLN
INTRAVENOUS | Status: AC
  Filled 2013-12-19: qty 1

## 2013-12-19 MED ORDER — ONDANSETRON HCL 4 MG/2ML IJ SOLN: 4.0000 mg | INTRAMUSCULAR | Status: DC | PRN

## 2013-12-19 MED ORDER — DIAZEPAM 5 MG PO TABS: 5.0000 mg | ORAL_TABLET | Freq: Two times a day (BID) | ORAL | Status: DC | PRN

## 2013-12-19 MED ORDER — KETOROLAC TROMETHAMINE 30 MG/ML IJ SOLN
INTRAMUSCULAR | Status: AC
  Administered 2013-12-19: 12:00:00
  Filled 2013-12-19: qty 1

## 2013-12-19 MED ORDER — DULOXETINE HCL 60 MG PO CPEP
60.0000 mg | ORAL_CAPSULE | Freq: Every day | ORAL | Status: DC
  Administered 2013-12-19: 60 mg via ORAL
  Filled 2013-12-19 (×2): qty 1

## 2013-12-19 MED ORDER — BUPIVACAINE LIPOSOME 1.3 % IJ SUSP
INTRAMUSCULAR | Status: DC | PRN
  Administered 2013-12-19: 20 mL

## 2013-12-19 MED ORDER — DOCUSATE SODIUM 100 MG PO CAPS
100.0000 mg | ORAL_CAPSULE | Freq: Two times a day (BID) | ORAL | Status: DC
  Administered 2013-12-19 – 2013-12-20 (×3): 100 mg via ORAL
  Filled 2013-12-19 (×4): qty 1

## 2013-12-19 MED ORDER — HYDROMORPHONE HCL 1 MG/ML IJ SOLN
0.2500 mg | INTRAMUSCULAR | Status: DC | PRN
  Administered 2013-12-19 (×2): 0.5 mg via INTRAVENOUS

## 2013-12-19 MED ORDER — PROMETHAZINE HCL 25 MG/ML IJ SOLN: 6.2500 mg | INTRAMUSCULAR | Status: DC | PRN

## 2013-12-19 MED ORDER — DEXAMETHASONE SODIUM PHOSPHATE 10 MG/ML IJ SOLN
8.0000 mg | Freq: Once | INTRAMUSCULAR | Status: AC
  Administered 2013-12-19: 8 mg via INTRAVENOUS

## 2013-12-19 MED ORDER — KCL IN DEXTROSE-NACL 20-5-0.45 MEQ/L-%-% IV SOLN
INTRAVENOUS | Status: AC
  Filled 2013-12-19: qty 1000

## 2013-12-19 MED ORDER — PROPOFOL 10 MG/ML IV BOLUS
INTRAVENOUS | Status: AC
  Filled 2013-12-19: qty 20

## 2013-12-19 MED ORDER — CEFAZOLIN SODIUM 1-5 GM-% IV SOLN
1.0000 g | Freq: Three times a day (TID) | INTRAVENOUS | Status: AC
  Administered 2013-12-19 – 2013-12-20 (×2): 1 g via INTRAVENOUS
  Filled 2013-12-19 (×4): qty 50

## 2013-12-19 MED ORDER — MIDODRINE HCL 5 MG PO TABS
5.0000 mg | ORAL_TABLET | Freq: Four times a day (QID) | ORAL | Status: DC | PRN
  Filled 2013-12-19: qty 1

## 2013-12-19 MED ORDER — ROCURONIUM BROMIDE 100 MG/10ML IV SOLN
INTRAVENOUS | Status: DC | PRN
  Administered 2013-12-19: 40 mg via INTRAVENOUS
  Administered 2013-12-19 (×3): 10 mg via INTRAVENOUS

## 2013-12-19 MED ORDER — PHENOL 1.4 % MT LIQD: 1.0000 | OROMUCOSAL | Status: DC | PRN

## 2013-12-19 MED ORDER — LIDOCAINE HCL (CARDIAC) 20 MG/ML IV SOLN
INTRAVENOUS | Status: AC
  Filled 2013-12-19: qty 5

## 2013-12-19 MED ORDER — BISACODYL 10 MG RE SUPP: 10.0000 mg | Freq: Every day | RECTAL | Status: DC | PRN

## 2013-12-19 MED ORDER — CHLORHEXIDINE GLUCONATE 4 % EX LIQD: 60.0000 mL | Freq: Once | CUTANEOUS | Status: DC

## 2013-12-19 MED ORDER — ACETAMINOPHEN 650 MG RE SUPP: 650.0000 mg | RECTAL | Status: DC | PRN

## 2013-12-19 MED ORDER — DEXTROSE 5 % IV SOLN
INTRAVENOUS | Status: DC | PRN
  Administered 2013-12-19: 08:00:00 via INTRAVENOUS

## 2013-12-19 MED ORDER — THROMBIN 20000 UNITS EX SOLR
CUTANEOUS | Status: AC
  Filled 2013-12-19: qty 20000

## 2013-12-19 MED ORDER — OXYCODONE HCL 5 MG/5ML PO SOLN: 5.0000 mg | Freq: Once | ORAL | Status: AC | PRN

## 2013-12-19 MED ORDER — FLEET ENEMA 7-19 GM/118ML RE ENEM: 1.0000 | ENEMA | Freq: Once | RECTAL | Status: AC | PRN

## 2013-12-19 MED ORDER — SODIUM CHLORIDE 0.9 % IJ SOLN: 3.0000 mL | INTRAMUSCULAR | Status: DC | PRN

## 2013-12-19 MED ORDER — SENNOSIDES-DOCUSATE SODIUM 8.6-50 MG PO TABS: 1.0000 | ORAL_TABLET | Freq: Every evening | ORAL | Status: DC | PRN

## 2013-12-19 MED ORDER — NEOSTIGMINE METHYLSULFATE 10 MG/10ML IV SOLN
INTRAVENOUS | Status: AC
  Filled 2013-12-19: qty 1

## 2013-12-19 MED ORDER — FENTANYL CITRATE 0.05 MG/ML IJ SOLN
INTRAMUSCULAR | Status: AC
  Filled 2013-12-19: qty 5

## 2013-12-19 MED ORDER — AMOXICILLIN 875 MG PO TABS: 875.0000 mg | ORAL_TABLET | Freq: Two times a day (BID) | ORAL | Status: DC

## 2013-12-19 MED ORDER — ALBUTEROL SULFATE (2.5 MG/3ML) 0.083% IN NEBU: 3.0000 mL | INHALATION_SOLUTION | Freq: Four times a day (QID) | RESPIRATORY_TRACT | Status: DC | PRN

## 2013-12-19 MED ORDER — ONDANSETRON HCL 4 MG/2ML IJ SOLN
INTRAMUSCULAR | Status: DC | PRN
  Administered 2013-12-19: 4 mg via INTRAVENOUS

## 2013-12-19 MED ORDER — HYDROCODONE-HOMATROPINE 5-1.5 MG/5ML PO SYRP: 5.0000 mL | ORAL_SOLUTION | Freq: Three times a day (TID) | ORAL | Status: DC | PRN

## 2013-12-19 MED ORDER — HYDROMORPHONE HCL 1 MG/ML IJ SOLN
INTRAMUSCULAR | Status: AC
  Administered 2013-12-19: 0.5 mg via INTRAVENOUS
  Filled 2013-12-19: qty 1

## 2013-12-19 MED ORDER — KETOROLAC TROMETHAMINE 30 MG/ML IJ SOLN
30.0000 mg | Freq: Once | INTRAMUSCULAR | Status: AC
  Administered 2013-12-19: 30 mg via INTRAVENOUS

## 2013-12-19 MED ORDER — METHOCARBAMOL 1000 MG/10ML IJ SOLN
500.0000 mg | Freq: Four times a day (QID) | INTRAMUSCULAR | Status: DC | PRN
  Filled 2013-12-19: qty 5

## 2013-12-19 MED ORDER — PHENYLEPHRINE HCL 10 MG/ML IJ SOLN
10.0000 mg | INTRAVENOUS | Status: DC | PRN
  Administered 2013-12-19: 40 ug/min via INTRAVENOUS

## 2013-12-19 MED ORDER — HYDROMORPHONE HCL 1 MG/ML IJ SOLN
0.5000 mg | INTRAMUSCULAR | Status: DC | PRN
  Administered 2013-12-20: 1 mg via INTRAVENOUS
  Filled 2013-12-19: qty 1

## 2013-12-19 MED ORDER — LACTATED RINGERS IV SOLN
INTRAVENOUS | Status: DC | PRN
  Administered 2013-12-19 (×3): via INTRAVENOUS

## 2013-12-19 MED ORDER — THROMBIN 20000 UNITS EX KIT
PACK | CUTANEOUS | Status: DC | PRN
  Administered 2013-12-19: 20000 [IU] via TOPICAL

## 2013-12-19 MED ORDER — SODIUM CHLORIDE 0.9 % IJ SOLN
3.0000 mL | Freq: Two times a day (BID) | INTRAMUSCULAR | Status: DC
Start: 2013-12-19 — End: 2013-12-20

## 2013-12-19 MED ORDER — ACETAMINOPHEN 325 MG PO TABS: 650.0000 mg | ORAL_TABLET | ORAL | Status: DC | PRN

## 2013-12-19 MED ORDER — DEXAMETHASONE SODIUM PHOSPHATE 10 MG/ML IJ SOLN
INTRAMUSCULAR | Status: AC
  Administered 2013-12-19: 8 mg via INTRAVENOUS
  Filled 2013-12-19: qty 1

## 2013-12-19 MED ORDER — BUTALBITAL-APAP-CAFFEINE 50-325-40 MG PO TABS
1.0000 | ORAL_TABLET | Freq: Two times a day (BID) | ORAL | Status: DC | PRN
  Administered 2013-12-19: 1 via ORAL
  Filled 2013-12-19: qty 1

## 2013-12-19 MED ORDER — LIDOCAINE HCL (CARDIAC) 20 MG/ML IV SOLN
INTRAVENOUS | Status: DC | PRN
  Administered 2013-12-19: 80 mg via INTRAVENOUS

## 2013-12-19 MED ORDER — MIDAZOLAM HCL 5 MG/5ML IJ SOLN
INTRAMUSCULAR | Status: DC | PRN
  Administered 2013-12-19: 1 mg via INTRAVENOUS

## 2013-12-19 MED ORDER — PROPOFOL 10 MG/ML IV BOLUS
INTRAVENOUS | Status: DC | PRN
  Administered 2013-12-19: 150 mg via INTRAVENOUS

## 2013-12-19 MED ORDER — BUSPIRONE HCL 5 MG PO TABS
5.0000 mg | ORAL_TABLET | Freq: Two times a day (BID) | ORAL | Status: DC
  Administered 2013-12-19 – 2013-12-20 (×2): 5 mg via ORAL
  Filled 2013-12-19 (×3): qty 1

## 2013-12-19 MED ORDER — OXYCODONE-ACETAMINOPHEN 5-325 MG PO TABS
1.0000 | ORAL_TABLET | ORAL | Status: DC | PRN
  Administered 2013-12-19: 1 via ORAL
  Administered 2013-12-19 – 2013-12-20 (×4): 2 via ORAL
  Filled 2013-12-19: qty 2
  Filled 2013-12-19: qty 1
  Filled 2013-12-19 (×3): qty 2

## 2013-12-19 MED ORDER — BUPIVACAINE HCL 0.5 % IJ SOLN
INTRAMUSCULAR | Status: DC | PRN
  Administered 2013-12-19: 20 mL

## 2013-12-19 MED ORDER — CHLORHEXIDINE GLUCONATE 4 % EX LIQD
60.0000 mL | Freq: Once | CUTANEOUS | Status: DC
Start: 2013-12-19 — End: 2013-12-19

## 2013-12-19 MED ORDER — ONDANSETRON HCL 4 MG/2ML IJ SOLN
INTRAMUSCULAR | Status: AC
  Filled 2013-12-19: qty 2

## 2013-12-19 MED ORDER — PANTOPRAZOLE SODIUM 40 MG PO TBEC
40.0000 mg | DELAYED_RELEASE_TABLET | Freq: Every day | ORAL | Status: DC
  Administered 2013-12-19 – 2013-12-20 (×3): 40 mg via ORAL
  Filled 2013-12-19 (×3): qty 1

## 2013-12-19 MED ORDER — HALOPERIDOL 0.5 MG PO TABS
0.5000 mg | ORAL_TABLET | Freq: Two times a day (BID) | ORAL | Status: DC
  Administered 2013-12-19 – 2013-12-20 (×2): 0.5 mg via ORAL
  Filled 2013-12-19 (×3): qty 1

## 2013-12-19 MED ORDER — TRAZODONE HCL 100 MG PO TABS
200.0000 mg | ORAL_TABLET | Freq: Every day | ORAL | Status: DC
  Administered 2013-12-19: 200 mg via ORAL
  Filled 2013-12-19 (×2): qty 2

## 2013-12-19 MED ORDER — 0.9 % SODIUM CHLORIDE (POUR BTL) OPTIME
TOPICAL | Status: DC | PRN
  Administered 2013-12-19: 1000 mL

## 2013-12-19 MED ORDER — ZOLPIDEM TARTRATE 5 MG PO TABS: 5.0000 mg | ORAL_TABLET | Freq: Every evening | ORAL | Status: DC | PRN

## 2013-12-19 MED ORDER — BACLOFEN 10 MG PO TABS
10.0000 mg | ORAL_TABLET | Freq: Three times a day (TID) | ORAL | Status: DC
  Administered 2013-12-19: 10 mg via ORAL
  Filled 2013-12-19 (×5): qty 1

## 2013-12-19 MED ORDER — GLYCOPYRROLATE 0.2 MG/ML IJ SOLN
INTRAMUSCULAR | Status: DC | PRN
  Administered 2013-12-19: 0.6 mg via INTRAVENOUS

## 2013-12-19 MED ORDER — SODIUM CHLORIDE 0.9 % IV SOLN: 250.0000 mL | INTRAVENOUS | Status: DC

## 2013-12-19 MED ORDER — MIDAZOLAM HCL 2 MG/2ML IJ SOLN
INTRAMUSCULAR | Status: AC
  Filled 2013-12-19: qty 2

## 2013-12-19 MED ORDER — DEXAMETHASONE SODIUM PHOSPHATE 4 MG/ML IJ SOLN
8.0000 mg | Freq: Once | INTRAMUSCULAR | Status: AC
  Administered 2013-12-19: 8 mg via INTRAVENOUS
  Filled 2013-12-19: qty 2

## 2013-12-19 MED ORDER — BUPIVACAINE LIPOSOME 1.3 % IJ SUSP
20.0000 mL | INTRAMUSCULAR | Status: DC
  Filled 2013-12-19: qty 20

## 2013-12-19 MED ORDER — ALBUTEROL SULFATE (2.5 MG/3ML) 0.083% IN NEBU: 2.5000 mg | INHALATION_SOLUTION | Freq: Four times a day (QID) | RESPIRATORY_TRACT | Status: DC | PRN

## 2013-12-19 MED ORDER — OXYCODONE HCL 5 MG PO TABS
ORAL_TABLET | ORAL | Status: AC
  Administered 2013-12-19: 14:00:00
  Filled 2013-12-19: qty 1

## 2013-12-19 MED ORDER — FLUDROCORTISONE ACETATE 0.1 MG PO TABS
0.1000 mg | ORAL_TABLET | Freq: Every day | ORAL | Status: DC
  Filled 2013-12-19 (×2): qty 1

## 2013-12-19 MED ORDER — ESCITALOPRAM OXALATE 20 MG PO TABS
20.0000 mg | ORAL_TABLET | Freq: Every day | ORAL | Status: DC
  Filled 2013-12-19 (×2): qty 1

## 2013-12-19 MED ORDER — MENTHOL 3 MG MT LOZG: 1.0000 | LOZENGE | OROMUCOSAL | Status: DC | PRN

## 2013-12-19 MED ORDER — FAMOTIDINE 20 MG PO TABS
20.0000 mg | ORAL_TABLET | Freq: Every day | ORAL | Status: DC
  Administered 2013-12-19: 20 mg via ORAL
  Filled 2013-12-19 (×2): qty 1

## 2013-12-19 MED ORDER — ARTIFICIAL TEARS OP OINT
TOPICAL_OINTMENT | OPHTHALMIC | Status: AC
  Filled 2013-12-19: qty 3.5

## 2013-12-19 MED ORDER — NEOSTIGMINE METHYLSULFATE 10 MG/10ML IV SOLN
INTRAVENOUS | Status: DC | PRN
  Administered 2013-12-19: 4 mg via INTRAVENOUS

## 2013-12-19 MED ORDER — ARTIFICIAL TEARS OP OINT
TOPICAL_OINTMENT | OPHTHALMIC | Status: DC | PRN
  Administered 2013-12-19: 1 via OPHTHALMIC

## 2013-12-19 MED ORDER — KCL IN DEXTROSE-NACL 20-5-0.45 MEQ/L-%-% IV SOLN
INTRAVENOUS | Status: DC
  Administered 2013-12-19 – 2013-12-20 (×2): via INTRAVENOUS
  Filled 2013-12-19 (×4): qty 1000

## 2013-12-19 SURGICAL SUPPLY — 60 items
ADH SKN CLS APL DERMABOND .7 (GAUZE/BANDAGES/DRESSINGS) ×1
ADH SKN CLS LQ APL DERMABOND (GAUZE/BANDAGES/DRESSINGS) ×1
BLADE SURG ROTATE 9660 (MISCELLANEOUS) IMPLANT
BUR MATCHSTICK NEURO 3.0 LAGG (BURR) ×1 IMPLANT
BUR ROUND FLUTED 4 SOFT TCH (BURR) ×1 IMPLANT
BUR SABER RD CUTTING 3.0 (BURR) IMPLANT
COLLAR CERV LO CONTOUR FIRM DE (SOFTGOODS) ×3 IMPLANT
CORDS BIPOLAR (ELECTRODE) ×2 IMPLANT
COVER SURGICAL LIGHT HANDLE (MISCELLANEOUS) ×3 IMPLANT
DERMABOND ADHESIVE PROPEN (GAUZE/BANDAGES/DRESSINGS) ×1
DERMABOND ADVANCED (GAUZE/BANDAGES/DRESSINGS) ×1
DERMABOND ADVANCED .7 DNX12 (GAUZE/BANDAGES/DRESSINGS) ×1 IMPLANT
DERMABOND ADVANCED .7 DNX6 (GAUZE/BANDAGES/DRESSINGS) IMPLANT
DRAPE C-ARM 42X72 X-RAY (DRAPES) ×2 IMPLANT
DRAPE INCISE IOBAN 66X45 STRL (DRAPES) ×1 IMPLANT
DRAPE LAPAROTOMY T 102X78X121 (DRAPES) ×2 IMPLANT
DRAPE MICROSCOPE LEICA (MISCELLANEOUS) ×1 IMPLANT
DRAPE PROXIMA HALF (DRAPES) ×6 IMPLANT
DRAPE SURG 17X23 STRL (DRAPES) ×5 IMPLANT
DRSG MEPILEX BORDER 4X4 (GAUZE/BANDAGES/DRESSINGS) ×1 IMPLANT
DRSG MEPILEX BORDER 4X8 (GAUZE/BANDAGES/DRESSINGS) IMPLANT
DURAPREP 26ML APPLICATOR (WOUND CARE) ×2 IMPLANT
ELECT COATED BLADE 2.86 ST (ELECTRODE) ×2 IMPLANT
ELECT REM PT RETURN 9FT ADLT (ELECTROSURGICAL) ×2
ELECTRODE REM PT RTRN 9FT ADLT (ELECTROSURGICAL) ×1 IMPLANT
EVACUATOR 1/8 PVC DRAIN (DRAIN) IMPLANT
GAUZE SPONGE 4X4 12PLY STRL (GAUZE/BANDAGES/DRESSINGS) ×2 IMPLANT
GLOVE BIO SURGEON STRL SZ 6.5 (GLOVE) ×1 IMPLANT
GLOVE BIOGEL M 6.5 STRL (GLOVE) ×1 IMPLANT
GLOVE BIOGEL PI IND STRL 7.5 (GLOVE) ×1 IMPLANT
GLOVE BIOGEL PI INDICATOR 7.5 (GLOVE) ×1
GLOVE ECLIPSE 7.0 STRL STRAW (GLOVE) ×2 IMPLANT
GLOVE ECLIPSE 9.0 STRL (GLOVE) ×2 IMPLANT
GLOVE SURG 8.5 LATEX PF (GLOVE) ×2 IMPLANT
GOWN STRL REUS W/ TWL LRG LVL3 (GOWN DISPOSABLE) ×2 IMPLANT
GOWN STRL REUS W/TWL 2XL LVL3 (GOWN DISPOSABLE) ×2 IMPLANT
GOWN STRL REUS W/TWL LRG LVL3 (GOWN DISPOSABLE) ×4
KIT BASIN OR (CUSTOM PROCEDURE TRAY) ×2 IMPLANT
KIT ROOM TURNOVER OR (KITS) ×2 IMPLANT
MANIFOLD NEPTUNE II (INSTRUMENTS) ×1 IMPLANT
NS IRRIG 1000ML POUR BTL (IV SOLUTION) ×2 IMPLANT
PACK ORTHO CERVICAL (CUSTOM PROCEDURE TRAY) ×2 IMPLANT
PAD ARMBOARD 7.5X6 YLW CONV (MISCELLANEOUS) ×6 IMPLANT
PATTIES SURGICAL .25X.25 (GAUZE/BANDAGES/DRESSINGS) ×3 IMPLANT
PATTIES SURGICAL .75X.75 (GAUZE/BANDAGES/DRESSINGS) ×2 IMPLANT
SPONGE GAUZE 4X4 12PLY STER LF (GAUZE/BANDAGES/DRESSINGS) ×1 IMPLANT
SPONGE SURGIFOAM ABS GEL 100 (HEMOSTASIS) IMPLANT
SURGIFLO TRUKIT (HEMOSTASIS) ×1 IMPLANT
SUT ETHIBOND CT1 BRD #0 30IN (SUTURE) ×2 IMPLANT
SUT VIC AB 0 CT1 27 (SUTURE)
SUT VIC AB 0 CT1 27XBRD ANBCTR (SUTURE) IMPLANT
SUT VIC AB 2-0 CT1 27 (SUTURE)
SUT VIC AB 2-0 CT1 TAPERPNT 27 (SUTURE) IMPLANT
SUT VIC AB 2-0 UR6 27 (SUTURE) IMPLANT
SUT VICRYL 0 UR6 27IN ABS (SUTURE) ×2 IMPLANT
SUT VICRYL 4-0 PS2 18IN ABS (SUTURE) ×2 IMPLANT
TOWEL OR 17X24 6PK STRL BLUE (TOWEL DISPOSABLE) ×2 IMPLANT
TOWEL OR 17X26 10 PK STRL BLUE (TOWEL DISPOSABLE) ×2 IMPLANT
TRAY FOLEY CATH 16FRSI W/METER (SET/KITS/TRAYS/PACK) ×4 IMPLANT
WATER STERILE IRR 1000ML POUR (IV SOLUTION) ×1 IMPLANT

## 2013-12-19 NOTE — Interval H&P Note (Signed)
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 After consideration of risks, benefits and other options for treatment, the patient has consented to  Procedure(s): RIGHT C3-4.C4-5 AND C5-6 FORAMINOTOMIES (N/A) as a surgical intervention .  The patient's history has been reviewed, patient examined, no change in status, stable for surgery.  I have reviewed the patient's chart and labs.  Questions were answered to the patient's satisfaction.     Isidra Mings E

## 2013-12-19 NOTE — Anesthesia Postprocedure Evaluation (Signed)
  Anesthesia Post-op Note  Patient: [name]  Procedure(s) Performed: Procedure(s): RIGHT C3-4.C4-5 AND C5-6 FORAMINOTOMIES (N/A)  Patient Location: PACU  Anesthesia Type:General  Level of Consciousness: awake and alert   Airway and Oxygen Therapy: Patient Spontanous Breathing  Post-op Pain: mild  Post-op Assessment: Post-op Vital signs reviewed  Post-op Vital Signs: stable  Last Vitals:  Filed Vitals:   12/19/13 1200  BP: 127/65  Pulse: 104  Temp:   Resp: 22    Complications: No apparent anesthesia complications

## 2013-12-19 NOTE — Anesthesia Procedure Notes (Signed)
Procedure Name: Intubation Date/Time: 12/19/2013 7:42 AM Performed by: Marni Griffon Pre-anesthesia Checklist: Patient identified, Emergency Drugs available, Suction available and Patient being monitored Patient Re-evaluated:Patient Re-evaluated prior to inductionOxygen Delivery Method: Circle system utilized Preoxygenation: Pre-oxygenation with 100% oxygen Intubation Type: IV induction Ventilation: Mask ventilation without difficulty Laryngoscope Size: Mac and 3 Grade View: Grade I Tube type: Oral Tube size: 7.5 mm Number of attempts: 1 Airway Equipment and Method: Stylet Placement Confirmation: ETT inserted through vocal cords under direct vision,  breath sounds checked- equal and bilateral and positive ETCO2 Secured at: 21 (cm at gum) cm Tube secured with: Tape Dental Injury: Teeth and Oropharynx as per pre-operative assessment

## 2013-12-19 NOTE — Brief Op Note (Signed)
12/19/2013  11:04 AM  PATIENT:  [name]  58 y.o. 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  PROCEDURE:  Procedure(s): RIGHT C3-4.C4-5 AND C5-6 FORAMINOTOMIES (N/A)  SURGEON:  Surgeon(s) and Role:    * Kerrin Champagne, MD - Primary  PHYSICIAN ASSISTANT:Sheila Marita Kansas, PA-C    ANESTHESIA:   local and general  EBL:  Total I/O In: 1050 [I.V.:1050] Out: 315 [Urine:15; Blood:300]  BLOOD ADMINISTERED:none  DRAINS: Urinary Catheter (Foley)   LOCAL MEDICATIONS USED:  MARCAINE 1/2% 1:1 EXPAREL 1.3% , Amount: 20 ml   SPECIMEN:  No Specimen  DISPOSITION OF SPECIMEN:  N/A  COUNTS:  YES  TOURNIQUET:  * No tourniquets in log *  DICTATION: .Dragon Dictation  PLAN OF CARE: Admit to inpatient   PATIENT DISPOSITION:  PACU - hemodynamically stable.   Delay start of Pharmacological VTE agent (>24hrs) due to surgical blood loss or risk of bleeding: yes

## 2013-12-19 NOTE — Discharge Instructions (Signed)
Wear soft collar as needed for pain.  Ice packs to wound for comfort.  Change dressing daily or as needed. Avoid overhead use of arms.  May shower in 3 days if no wound drainage.  Keep incision covered

## 2013-12-19 NOTE — Anesthesia Preprocedure Evaluation (Addendum)
Anesthesia Evaluation  Patient identified by MRN, date of birth, ID band Patient awake    Reviewed: Allergy & Precautions, H&P , NPO status , Patient's Chart, lab work & pertinent test results, reviewed documented beta blocker date and time   History of Anesthesia Complications Negative for: history of anesthetic complications  Airway Mallampati: II TM Distance: >3 FB Neck ROM: Full    Dental  (+) Edentulous Upper, Edentulous Lower   Pulmonary shortness of breath, asthma , sleep apnea ,  breath sounds clear to auscultation        Cardiovascular hypertension, + angina Rhythm:Regular Rate:Normal  No CAD by cath,cleared by CARdiology   Neuro/Psych  Headaches, Seizures -,  Anxiety Depression Bipolar Disorder Last seizure this past week which pt states was due to BP dropping.  She gets a prodrome of slurred speech at times but other times she does not feel it coming on..the patient is not taking Keppra as ordered b/c she is afraid of side effects.  Neuromuscular disease    GI/Hepatic hiatal hernia, GERD-  ,  Endo/Other    Renal/GU      Musculoskeletal  (+) Arthritis -, Fibromyalgia -  Abdominal   Peds  Hematology   Anesthesia Other Findings Pt also takes an antiviral for genital herpes.  Reproductive/Obstetrics                          Anesthesia Physical Anesthesia Plan  ASA: III  Anesthesia Plan: General   Post-op Pain Management:    Induction: Intravenous  Airway Management Planned: Oral ETT  Additional Equipment:   Intra-op Plan:   Post-operative Plan: Extubation in OR  Informed Consent: I have reviewed the patients History and Physical, chart, labs and discussed the procedure including the risks, benefits and alternatives for the proposed anesthesia with the patient or authorized representative who has indicated his/her understanding and acceptance.   Dental advisory given  Plan  Discussed with: CRNA and Surgeon  Anesthesia Plan Comments:         Anesthesia Quick Evaluation

## 2013-12-19 NOTE — Progress Notes (Signed)
Utilization review completed.  

## 2013-12-19 NOTE — Transfer of Care (Signed)
Immediate Anesthesia Transfer of Care Note  Patient: [name]  Procedure(s) Performed: Procedure(s): RIGHT C3-4.C4-5 AND C5-6 FORAMINOTOMIES (N/A)  Patient Location: PACU  Anesthesia Type:General  Level of Consciousness: awake, oriented and patient cooperative  Airway & Oxygen Therapy: Patient Spontanous Breathing and Patient connected to nasal cannula oxygen  Post-op Assessment: Report given to PACU RN, Post -op Vital signs reviewed and stable and Patient moving all extremities  Post vital signs: Reviewed and stable  Complications: No apparent anesthesia complications

## 2013-12-19 NOTE — Op Note (Signed)
12/19/2013  11:07 AM  PATIENT:  [name]  58 y.o. 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 Dr. Delia Chimes    COMPLICATIONS:  None.   EBL: 150cc  DRAINS: Foley to SD.     PROCEDURE:The patient was met in the holding area, and the appropriate cervical right C3-4, C4-5 and C5-6 levels identified and marked with "x" and my initials. All questions were answered and informed consent signed.   The patient was then transported to OR. The patient was then placed under general anesthesia without difficulty and transferred to the operating room table prone position [place] horseshoe with chest rolls. All pressure points well-padded PAS stockings.. The patient received appropriate preoperative antibiotic prophylaxis. Sterile prep with DuraPrep and draped in the usual manner the shoulders were taped downwards and skin traction over the skin of the neck. Following DuraPrep draped in the usual manner. After timeout protocol, a single 18g spinal needle placed right of  the midline and cross table lateral radiograph showed the needle directed at the posterior elements of C5-6.  Incision was made approximately1 1/2 inches in length from C5-6 superiorly to C2 in the midline. This following infiltration of skin and subcutaneous layers with local marcaine 0.5% 1:1 exparel 1.3% total of 10 cc. Incision carried through skin and subcutaneous layers using 10 blade scalpel and electrocautery down to the level ligamentum nuchae.  Incision made centered on the spinous process of C5-C2 Clamps then placed at the spinous process of C4 and C2 intraoperative lateral radiograghs identified the clamps at the C4 and C2 levels. The C4 spinous process marked with a surgical marker. Electrocautery then used to carefully incise the cervical muscles off the right lateral aspect of the spinous process of C3, C4 and C5. Carefully removing spinous muscles off of the inferior aspect lamina at C3, C4 and C5 exposing the C3-4 through C5-6 posterior interlaminar spaces. Loupe magnification headlamp were used during this portion procedure. Boss McCollough retractor was inserted. High-speed bur was used to remove a small portion of bone from the inferior aspect of lamina of C3, C4 and C5, and the medial 20% of the inferior articular process of C3, C4 and C5. Further thinning the superior aspect of the lamina of C6, C5 and C4. A 1 mm Kerrison was then used to remove ligamentum flavum from superior aspect of the lamina C6, C5 and C4 and the medial aspect of the inferior articular process of C3, C4 and C5 approximately 20% exposing the superior articular process of C6,C5 and C4.  A 1 mm Kerrison was used to remove bone off the superior aspect of the lamina of C6,C5 and C4 and then resecting 20% of the medial aspect of the superior articular process of C6, C5 and C4. Ligamentum flavum then easily lifted and electrocautery used to cauterize epidural veins deep to  the ligamentum flavum at C3-4, C4-5 and C5-6 and the fibrovascular leash overlying the C6, C5 and C4 nerve roots were resected. The operating room microscope was draped sterilely and brought into the  field. Under the operating room microscope the epidural vein layer overlying the posterior aspect of the thecal sac and the C6, C5 and C4  nerve roots were then carefully lifted using a micro-titanium were cauterized using bipolar electrocautery and divided overlying the C6,C5 and C4 nerve roots releasing the  vascular leash a forward angle 3-0 microcurette then used to remove a small portion of bone off the superior and medial aspect of the pedicle further mobilizing the C6,C5 and C4 nerve roots bipolar electrocautery used to control all bleeding within the axillary area and C6, C5 and C4  nerve. Surgiflow, thrombin soaked gel foam and bone wax was applied to bleeding cancellus bone surfaces are excellent hemostasis obtained. The C6, C5 and C4 nerve roots were tracked superiorly and laterally with a nerve hook.  Posterior mass effect was felt to represent uncovertebral spur at each of the 3 levels. Following this then hemostasis was obtained using thrombin-soaked Gelfoam and micro-pledgettes. When complete hemostasis was obtained all trial was removed I nerve hook could be easily passed out the neuroforamen without the lateral aspect of the C6,C5 and C4 pedicles demonstrating the C6, C5 and C4  neuroforamen to be completely decompressed. The C5-6 and C4-5 foraminotomies demonstrated well decompressed C6 and C5 nerve roots, the C3-4 foramenotomy demonstraded excellent decompression of the inferior half of the neuroforamen, however the superior articular process of the right C4 level was directly impressed against the upper C4 neuroforamen and the nerve root un the upper half Of the neuroforamen felt to be non functional. Irrigation was carried out no active bleeding was present. Following further irrigation and the incision was closed by approximating the ligamentum nuchae with 0 Ethibond sutures. The subcutaneous layers approximated with interrupted 0 Vicryl suture more superficial layers with interrupted 2-0 Vicryl sutures and the skin closed with interrupted 4-0 Vicryl sutures. Dermabond was applied then MedPlex bandage. Soft cervical collar all instrument and sponge counts were correct. Patient was then returned to supine position on her stretcher. Returned to recovery room in satisfactory condition.  Physician  assistant's responsibilities: Phillips Hay PA-C perform the duties of assistant physician and surgeon during this case present from the beginning of the case to the end of the case. She assisted with careful retraction of neural structures suctioning about her elements including C4,C5 and C6nerve roots. Performed closure of the incision on the ligamentum nuchae to the skin and application of dressing. She assisted in positioning the patient had removal the patient from the OR table to her stretcher.        Irlanda Croghan E  12/19/2013, 11:07 AM

## 2013-12-20 ENCOUNTER — Ambulatory Visit: Payer: Self-pay | Admitting: Orthopedic Surgery

## 2013-12-20 ENCOUNTER — Encounter (HOSPITAL_COMMUNITY): Payer: Self-pay | Admitting: Specialist

## 2013-12-20 DIAGNOSIS — M47812 Spondylosis without myelopathy or radiculopathy, cervical region: Secondary | ICD-10-CM | POA: Diagnosis not present

## 2013-12-20 MED ORDER — INFLUENZA VAC SPLIT QUAD 0.5 ML IM SUSY: 0.5000 mL | PREFILLED_SYRINGE | INTRAMUSCULAR | Status: DC

## 2013-12-20 MED ORDER — METHOCARBAMOL 500 MG PO TABS: 500.0000 mg | ORAL_TABLET | Freq: Four times a day (QID) | ORAL | Status: DC | PRN

## 2013-12-20 MED FILL — Thrombin For Soln 20000 Unit: CUTANEOUS | Qty: 1 | Status: AC

## 2013-12-20 NOTE — Progress Notes (Signed)
Patient examined and lab reviewed with Vernon PA-C. 

## 2013-12-20 NOTE — Discharge Summary (Signed)
Physician Discharge Summary  Patient ID: [name] MRN: [id number] DOB/AGE: 57-Dec-1957 57 y.o.  Admit date: 12/19/2013 Discharge date: 12/20/2013  Admission Diagnoses:  Cervical spondylosis without myelopathy  Discharge Diagnoses:  Principal Problem:   Cervical spondylosis without myelopathy Active Problems:   Neural foraminal stenosis of cervical spine   Past Medical History  Diagnosis Date  . Anxiety   . Herpes simplex infection   . Osteoporosis   . Hypertension   . Dyslipidemia   . Depression   . Asthma   . Fibromyalgia   . GERD (gastroesophageal reflux disease)   . Head injury, unspecified   . Supraventricular tachycardia   . Syncope and collapse     s/p ILR; no arhythmogenic cause identified  . Coronary artery disease     reported hx of "MI";  Echo 2009 with normal LVF;  Myoview 05/2011: no ischemia  . Spondylolisthesis   . Spinal stenosis of lumbar region   . Hyperlipidemia   . Bipolar 1 disorder   . Insomnia   . Arthritis     RHEUMATOID  . Esophageal stricture   . Myocardial infarction   . Anginal pain     last time   . Status post placement of implantable loop recorder   . Sleep apnea     ? neg  . COPD (chronic obstructive pulmonary disease)   . Shortness of breath   . Pneumonia     hx  . History of kidney stones   . UTI (lower urinary tract infection)   . H/O hiatal hernia   . Seizures     last 3 weeks ago- prescribed keppra -never took didnt like side effects read about online    Surgeries: Procedure(s): RIGHT C3-4.C4-5 AND C5-6 FORAMINOTOMIES on 12/19/2013   Consultants (if any):  none  Discharged Condition: Improved  Hospital Course: [name] is an 58 y.o. female who was admitted 12/19/2013 with a diagnosis of Cervical spondylosis without myelopathy and went to the operating room on 12/19/2013 and underwent the above named procedures.    She was given perioperative antibiotics:  Anti-infectives   Start     Dose/Rate Route  Frequency Ordered Stop   12/19/13 2200  valACYclovir (VALTREX) tablet 500 mg     500 mg Oral 2 times daily 12/19/13 1416     12/19/13 1545  ceFAZolin (ANCEF) IVPB 1 g/50 mL premix     1 g 100 mL/hr over 30 Minutes Intravenous Every 8 hours 12/19/13 1416 12/20/13 0459   12/19/13 1430  amoxicillin (AMOXIL) tablet 875 mg  Status:  Discontinued     875 mg Oral 2 times daily 12/19/13 1416 12/19/13 1423   12/19/13 0600  ceFAZolin (ANCEF) IVPB 2 g/50 mL premix     2 g 100 mL/hr over 30 Minutes Intravenous On call to O.R. 12/18/13 1401 12/19/13 0745    .  She was given sequential compression devices, early ambulation for DVT prophylaxis.  She benefited maximally from the hospital stay and there were no complications.    Recent vital signs:  Filed Vitals:   12/20/13 0511  BP: 125/63  Pulse: 77  Temp: 98 F (36.7 C)  Resp:     Recent laboratory studies:  Lab Results  Component Value Date   HGB 12.7 12/12/2013   HGB 12.3 11/08/2013   HGB 14.4 06/20/2013   Lab Results  Component Value Date   WBC 12.7* 12/12/2013   PLT 219 12/12/2013   Lab Results  Component Value Date  INR 1.1* 10/01/2011   Lab Results  Component Value Date   NA 139 12/12/2013   K 4.2 12/12/2013   CL 102 12/12/2013   CO2 22 12/12/2013   BUN 18 12/12/2013   CREATININE 0.73 12/12/2013   GLUCOSE 80 12/12/2013    Discharge Medications:     Medication List         acyclovir 200 MG capsule  Commonly known as:  ZOVIRAX  Take 1 capsule (200 mg total) by mouth 5 (five) times daily.     albuterol 108 (90 BASE) MCG/ACT inhaler  Commonly known as:  PROVENTIL HFA;VENTOLIN HFA  Inhale 2 puffs into the lungs every 6 (six) hours as needed for wheezing or shortness of breath.     albuterol (2.5 MG/3ML) 0.083% nebulizer solution  Commonly known as:  PROVENTIL  Take 3 mLs (2.5 mg total) by nebulization every 6 (six) hours as needed for wheezing or shortness of breath.     amoxicillin 875 MG tablet  Commonly known as:   AMOXIL  Take 1 tablet (875 mg total) by mouth 2 (two) times daily.     baclofen 10 MG tablet  Commonly known as:  LIORESAL  Take 1 tablet (10 mg total) by mouth 3 (three) times daily.     busPIRone 5 MG tablet  Commonly known as:  BUSPAR  Take 5 mg by mouth 2 (two) times daily.     butalbital-acetaminophen-caffeine 50-325-40 MG per tablet  Commonly known as:  FIORICET, ESGIC  TAKE 1 TABLET TWICE A DAY AS NEEDED FOR TENSION HEADACHE.     CYMBALTA 60 MG capsule  Generic drug:  DULoxetine  Take 60 mg by mouth at bedtime.     DEXILANT 60 MG capsule  Generic drug:  dexlansoprazole  Take 60 mg by mouth daily.     diazepam 5 MG tablet  Commonly known as:  VALIUM  Take 1 tablet (5 mg total) by mouth every 12 (twelve) hours as needed for anxiety.     escitalopram 20 MG tablet  Commonly known as:  LEXAPRO  Take 1 tablet (20 mg total) by mouth daily.     fludrocortisone 0.1 MG tablet  Commonly known as:  FLORINEF  Take 1 tablet (0.1 mg total) by mouth daily.     haloperidol 0.5 MG tablet  Commonly known as:  HALDOL  Take 0.5 mg by mouth 2 (two) times daily.     HYDROcodone-homatropine 5-1.5 MG/5ML syrup  Commonly known as:  HYCODAN  Take 5 mLs by mouth every 8 (eight) hours as needed for cough.     levETIRAcetam 500 MG tablet  Commonly known as:  KEPPRA  Take 500 mg by mouth. Patient is currently not taking. (11/22/13)     methocarbamol 500 MG tablet  Commonly known as:  ROBAXIN  Take 1 tablet (500 mg total) by mouth every 6 (six) hours as needed for muscle spasms.     midodrine 5 MG tablet  Commonly known as:  PROAMATINE  Take 5 mg by mouth 4 (four) times daily as needed (blood pressure lower than 110).     oxyCODONE-acetaminophen 5-325 MG per tablet  Commonly known as:  PERCOCET/ROXICET  Take 1 tablet by mouth every 4 (four) hours as needed for severe pain.     oxyCODONE-acetaminophen 5-325 MG per tablet  Commonly known as:  ROXICET  Take 1-2 tablets by mouth every  4 (four) hours as needed.     ranitidine 150 MG tablet  Commonly known as:  ZANTAC  Take 150 mg by mouth at bedtime.     rosuvastatin 20 MG tablet  Commonly known as:  CRESTOR  Take 10 mg by mouth daily.     traZODone 100 MG tablet  Commonly known as:  DESYREL  Take 200 mg by mouth daily.     valACYclovir 500 MG tablet  Commonly known as:  VALTREX  Take 500 mg by mouth 2 (two) times daily.        Diagnostic Studies: Dg Chest 2 View  12/12/2013   CLINICAL DATA:  Asthma.  Cough.  EXAM: CHEST  2 VIEW  COMPARISON:  04/01/2013.  FINDINGS: The cardiac silhouette, mediastinal and hilar contours are within normal limits and stable. A loop recorder is again noted on the left. Streaky bibasilar scarring and subsegmental atelectasis but no infiltrates, edema or effusions. The bony thorax is intact.  IMPRESSION: Minimal bibasilar scarring and subsegmental atelectasis but no infiltrates, edema or effusions.   Electronically Signed   By: Loralie Champagne M.D.   On: 12/12/2013 13:20   Dg Cervical Spine 2-3 Views  12/19/2013   CLINICAL DATA:  Intraoperative localization for cervical spine surgery.  EXAM: CERVICAL SPINE - 2-3 VIEW  COMPARISON:  Cervical spine CT scan 10/17/2013  FINDINGS: The first film demonstrates a posterior spinal needle projecting at approximately C7.  The second film demonstrates a surgical clamp on the C6 spinous process.  The third film demonstrates a spinal clamps on C3 and C5  IMPRESSION: Intraoperative localization for posterior cervical fusion.   Electronically Signed   By: Loralie Champagne M.D.   On: 12/19/2013 11:04    Disposition: 01-Home or Self Care      Discharge Instructions   Call MD / Call 911    Complete by:  As directed   If you experience chest pain or shortness of breath, CALL 911 and be transported to the hospital emergency room.  If you develope a fever above 101 F, pus (white drainage) or increased drainage or redness at the wound, or calf pain, call your  surgeon's office.     Constipation Prevention    Complete by:  As directed   Drink plenty of fluids.  Prune juice may be helpful.  You may use a stool softener, such as Colace (over the counter) 100 mg twice a day.  Use MiraLax (over the counter) for constipation as needed.     Diet - low sodium heart healthy    Complete by:  As directed      Discharge instructions    Complete by:  As directed   No lifting greater than 10 lbs. No overhead use of arms. Avoid bending,and twisting neck. Walk in house for first week them may start to get out slowly increasing distance up to one mile by 3 weeks post op. Keep incision dry for 3 days, may then bathe and wet incision  Call if any fevers >101, chills, or increasing numbness or weakness or increased swelling or drainage. Ice packs daily or as needed for pain and swelling. Wear soft collar as needed.     Driving restrictions    Complete by:  As directed   No driving     Increase activity slowly as tolerated    Complete by:  As directed      Lifting restrictions    Complete by:  As directed   No lifting           Follow-up Information   Follow up with NITKA,JAMES  E, MD. Schedule an appointment as soon as possible for a visit in 2 weeks.   Specialty:  Orthopedic Surgery   Contact information:   [address] [place] [address] [phone]        Signed: Wende Neighbors 12/20/2013, 2:52 PM

## 2013-12-20 NOTE — Discharge Summary (Signed)
Patient d/c summary note and lab reviewed.  

## 2013-12-20 NOTE — Progress Notes (Signed)
Subjective: 1 Day Post-Op Procedure(s) (LRB): RIGHT C3-4.C4-5 AND C5-6 FORAMINOTOMIES (N/A) Patient reports pain as mild.   Has been out of bed most of the day.  Independent with walking to the bathroom Eating well.  Voiding.  Ready for discharge.  Objective: Vital signs in last 24 hours: Temp:  [97.8 F (36.6 C)-98.5 F (36.9 C)] 98 F (36.7 C) (09/22 0511) Pulse Rate:  [57-94] 77 (09/22 0511) Resp:  [18] 18 (09/21 1651) BP: (90-125)/(54-63) 125/63 mmHg (09/22 0511) SpO2:  [90 %-99 %] 98 % (09/22 0511)  Intake/Output from previous day: 09/21 0701 - 09/22 0700 In: 3370 [P.O.:120; I.V.:3150; IV Piggyback:100] Out: 1465 [Urine:1165; Blood:300] Intake/Output this shift: Total I/O In: 240 [P.O.:240] Out: -   No results found for this basename: HGB,  in the last 72 hours No results found for this basename: WBC, RBC, HCT, PLT,  in the last 72 hours No results found for this basename: NA, K, CL, CO2, BUN, CREATININE, GLUCOSE, CALCIUM,  in the last 72 hours No results found for this basename: LABPT, INR,  in the last 72 hours  Neurovascular intact Sensation intact distally Incision: no drainage  Assessment/Plan: 1 Day Post-Op Procedure(s) (LRB): RIGHT C3-4.C4-5 AND C5-6 FORAMINOTOMIES (N/A) D/C IV fluids Discharge home with family OV 2 weeks rx percocet and robaxin Ice packs as needed.  Soft collar as needed.    Margaruite Top M 12/20/2013, 2:44 PM

## 2013-12-24 DIAGNOSIS — K219 Gastro-esophageal reflux disease without esophagitis: Secondary | ICD-10-CM | POA: Diagnosis not present

## 2013-12-24 DIAGNOSIS — E7800 Pure hypercholesterolemia, unspecified: Secondary | ICD-10-CM | POA: Diagnosis not present

## 2013-12-24 DIAGNOSIS — Z885 Allergy status to narcotic agent status: Secondary | ICD-10-CM | POA: Diagnosis not present

## 2013-12-24 DIAGNOSIS — F319 Bipolar disorder, unspecified: Secondary | ICD-10-CM | POA: Diagnosis not present

## 2013-12-24 DIAGNOSIS — Z888 Allergy status to other drugs, medicaments and biological substances status: Secondary | ICD-10-CM | POA: Diagnosis not present

## 2013-12-24 DIAGNOSIS — Z79899 Other long term (current) drug therapy: Secondary | ICD-10-CM | POA: Diagnosis not present

## 2013-12-24 DIAGNOSIS — I10 Essential (primary) hypertension: Secondary | ICD-10-CM | POA: Diagnosis not present

## 2013-12-24 DIAGNOSIS — G43909 Migraine, unspecified, not intractable, without status migrainosus: Secondary | ICD-10-CM | POA: Diagnosis not present

## 2013-12-24 DIAGNOSIS — Z9189 Other specified personal risk factors, not elsewhere classified: Secondary | ICD-10-CM | POA: Diagnosis not present

## 2013-12-24 DIAGNOSIS — E162 Hypoglycemia, unspecified: Secondary | ICD-10-CM | POA: Diagnosis not present

## 2013-12-24 DIAGNOSIS — Z9889 Other specified postprocedural states: Secondary | ICD-10-CM | POA: Diagnosis not present

## 2013-12-24 DIAGNOSIS — R112 Nausea with vomiting, unspecified: Secondary | ICD-10-CM | POA: Diagnosis not present

## 2013-12-24 DIAGNOSIS — Z8742 Personal history of other diseases of the female genital tract: Secondary | ICD-10-CM | POA: Diagnosis not present

## 2013-12-24 DIAGNOSIS — I252 Old myocardial infarction: Secondary | ICD-10-CM | POA: Diagnosis not present

## 2013-12-24 DIAGNOSIS — F4310 Post-traumatic stress disorder, unspecified: Secondary | ICD-10-CM | POA: Diagnosis not present

## 2013-12-24 DIAGNOSIS — M549 Dorsalgia, unspecified: Secondary | ICD-10-CM | POA: Diagnosis not present

## 2013-12-24 DIAGNOSIS — F4481 Dissociative identity disorder: Secondary | ICD-10-CM | POA: Diagnosis not present

## 2013-12-24 DIAGNOSIS — I519 Heart disease, unspecified: Secondary | ICD-10-CM | POA: Diagnosis not present

## 2013-12-24 DIAGNOSIS — G7109 Other specified muscular dystrophies: Secondary | ICD-10-CM | POA: Diagnosis not present

## 2013-12-24 DIAGNOSIS — J45909 Unspecified asthma, uncomplicated: Secondary | ICD-10-CM | POA: Diagnosis not present

## 2013-12-24 DIAGNOSIS — R6889 Other general symptoms and signs: Secondary | ICD-10-CM | POA: Diagnosis not present

## 2013-12-24 DIAGNOSIS — I959 Hypotension, unspecified: Secondary | ICD-10-CM | POA: Diagnosis not present

## 2013-12-24 DIAGNOSIS — K449 Diaphragmatic hernia without obstruction or gangrene: Secondary | ICD-10-CM | POA: Diagnosis not present

## 2013-12-24 DIAGNOSIS — R7989 Other specified abnormal findings of blood chemistry: Secondary | ICD-10-CM | POA: Diagnosis not present

## 2013-12-24 DIAGNOSIS — G40909 Epilepsy, unspecified, not intractable, without status epilepticus: Secondary | ICD-10-CM | POA: Diagnosis not present

## 2013-12-29 DIAGNOSIS — R8299 Other abnormal findings in urine: Secondary | ICD-10-CM | POA: Diagnosis not present

## 2013-12-29 DIAGNOSIS — N200 Calculus of kidney: Secondary | ICD-10-CM | POA: Diagnosis not present

## 2013-12-29 DIAGNOSIS — R3915 Urgency of urination: Secondary | ICD-10-CM | POA: Diagnosis not present

## 2014-01-03 ENCOUNTER — Telehealth: Payer: Self-pay | Admitting: Family Medicine

## 2014-01-03 NOTE — Telephone Encounter (Addendum)
Records reviewed. LFTs elevated. Records given to provider for review. Patient is asymptomatic. Denies abd pain.  Advised to avoid Tylenol products and alcohol. Appt scheduled for 01/05/14.

## 2014-01-03 NOTE — Telephone Encounter (Signed)
Patient seen at [facility] ED on 12/26/13. She was told her liver enzymes were elevated and she should follow up with her PCP.  Requested records to review and will call patient back.

## 2014-01-05 ENCOUNTER — Encounter: Payer: Self-pay | Admitting: Nurse Practitioner

## 2014-01-05 ENCOUNTER — Other Ambulatory Visit (INDEPENDENT_AMBULATORY_CARE_PROVIDER_SITE_OTHER): Payer: Medicare Other | Admitting: *Deleted

## 2014-01-05 ENCOUNTER — Ambulatory Visit (INDEPENDENT_AMBULATORY_CARE_PROVIDER_SITE_OTHER): Payer: Medicare Other

## 2014-01-05 ENCOUNTER — Encounter: Payer: Medicare Other | Admitting: Nurse Practitioner

## 2014-01-05 ENCOUNTER — Telehealth: Payer: Self-pay | Admitting: Family Medicine

## 2014-01-05 DIAGNOSIS — Z23 Encounter for immunization: Secondary | ICD-10-CM | POA: Diagnosis not present

## 2014-01-05 DIAGNOSIS — F411 Generalized anxiety disorder: Secondary | ICD-10-CM

## 2014-01-05 DIAGNOSIS — R748 Abnormal levels of other serum enzymes: Secondary | ICD-10-CM

## 2014-01-05 MED ORDER — DIAZEPAM 5 MG PO TABS: 5.0000 mg | ORAL_TABLET | Freq: Two times a day (BID) | ORAL | Status: DC | PRN

## 2014-01-05 NOTE — Progress Notes (Signed)
   Subjective:    Patient ID: [name], female    DOB: 29-Jul-1955, 58 y.o.   MRN: [id number]  HPI    Review of Systems     Objective:   Physical Exam        Assessment & Plan:  Just needed labs

## 2014-01-06 LAB — HEPATIC FUNCTION PANEL
ALBUMIN: 4 g/dL (ref 3.5–5.5)
ALK PHOS: 137 IU/L — AB (ref 39–117)
ALT: 17 IU/L (ref 0–32)
AST: 18 IU/L (ref 0–40)
Bilirubin, Direct: 0.09 mg/dL (ref 0.00–0.40)
Total Bilirubin: 0.3 mg/dL (ref 0.0–1.2)
Total Protein: 6.6 g/dL (ref 6.0–8.5)

## 2014-01-11 NOTE — Telephone Encounter (Signed)
Called on labs  

## 2014-01-18 DIAGNOSIS — M542 Cervicalgia: Secondary | ICD-10-CM | POA: Diagnosis not present

## 2014-01-18 DIAGNOSIS — M47812 Spondylosis without myelopathy or radiculopathy, cervical region: Secondary | ICD-10-CM | POA: Diagnosis not present

## 2014-01-18 DIAGNOSIS — M25512 Pain in left shoulder: Secondary | ICD-10-CM | POA: Diagnosis not present

## 2014-01-18 DIAGNOSIS — M62838 Other muscle spasm: Secondary | ICD-10-CM | POA: Diagnosis not present

## 2014-01-23 ENCOUNTER — Other Ambulatory Visit: Payer: Self-pay | Admitting: Nurse Practitioner

## 2014-01-23 ENCOUNTER — Other Ambulatory Visit: Payer: Self-pay | Admitting: Family Medicine

## 2014-01-24 ENCOUNTER — Other Ambulatory Visit: Payer: Self-pay | Admitting: *Deleted

## 2014-01-24 MED ORDER — NITROGLYCERIN 0.4 MG/SPRAY TL SOLN: 1.0000 | Status: DC | PRN

## 2014-01-24 NOTE — Telephone Encounter (Signed)
REceived fax for refill from [facility]. Not on pt med list.

## 2014-01-30 DIAGNOSIS — R3915 Urgency of urination: Secondary | ICD-10-CM | POA: Diagnosis not present

## 2014-01-30 DIAGNOSIS — E860 Dehydration: Secondary | ICD-10-CM | POA: Diagnosis not present

## 2014-01-30 DIAGNOSIS — N200 Calculus of kidney: Secondary | ICD-10-CM | POA: Diagnosis not present

## 2014-02-08 ENCOUNTER — Other Ambulatory Visit: Payer: Self-pay | Admitting: Family

## 2014-02-09 ENCOUNTER — Other Ambulatory Visit: Payer: Self-pay | Admitting: Nurse Practitioner

## 2014-02-09 ENCOUNTER — Other Ambulatory Visit: Payer: Self-pay

## 2014-02-09 MED ORDER — FUROSEMIDE 40 MG PO TABS: ORAL_TABLET | ORAL | Status: DC

## 2014-02-09 NOTE — Telephone Encounter (Signed)
Last seen 12/10/13 MMM  This med was not on EPIC list

## 2014-02-14 ENCOUNTER — Other Ambulatory Visit: Payer: Self-pay | Admitting: Nurse Practitioner

## 2014-02-15 NOTE — Telephone Encounter (Signed)
Last seen 12/10/13  MMM  If approved route to nurse to call into [facility]  [phone]

## 2014-02-15 NOTE — Telephone Encounter (Signed)
rx called into pharmacy

## 2014-02-15 NOTE — Telephone Encounter (Signed)
Please call in valium with 0 refills 

## 2014-02-27 ENCOUNTER — Ambulatory Visit: Payer: Medicare Other | Admitting: Cardiology

## 2014-02-28 DIAGNOSIS — F4310 Post-traumatic stress disorder, unspecified: Secondary | ICD-10-CM | POA: Diagnosis not present

## 2014-03-06 ENCOUNTER — Other Ambulatory Visit: Payer: Self-pay | Admitting: Family

## 2014-03-07 NOTE — Telephone Encounter (Signed)
Pt requesting refill on Fioricet. Last seen 12/10/13, last ordered 11/23/13 #60 1 po bid prn with 1 refill. Ok to refill? Please advise.

## 2014-03-08 ENCOUNTER — Telehealth: Payer: Self-pay | Admitting: *Deleted

## 2014-03-08 NOTE — Telephone Encounter (Signed)
Aware, script for fioricet ready.

## 2014-03-08 NOTE — Telephone Encounter (Signed)
rx ready for pickup 

## 2014-03-09 ENCOUNTER — Other Ambulatory Visit: Payer: Self-pay | Admitting: Nurse Practitioner

## 2014-03-10 ENCOUNTER — Other Ambulatory Visit: Payer: Self-pay | Admitting: Family

## 2014-03-13 ENCOUNTER — Ambulatory Visit (INDEPENDENT_AMBULATORY_CARE_PROVIDER_SITE_OTHER): Payer: Medicare Other | Admitting: Cardiology

## 2014-03-13 ENCOUNTER — Encounter: Payer: Self-pay | Admitting: Cardiology

## 2014-03-13 ENCOUNTER — Other Ambulatory Visit: Payer: Self-pay | Admitting: *Deleted

## 2014-03-13 VITALS — BP 110/75 | HR 80 | Ht 61.0 in | Wt 162.4 lb

## 2014-03-13 DIAGNOSIS — R55 Syncope and collapse: Secondary | ICD-10-CM | POA: Diagnosis not present

## 2014-03-13 DIAGNOSIS — E785 Hyperlipidemia, unspecified: Secondary | ICD-10-CM | POA: Diagnosis not present

## 2014-03-13 DIAGNOSIS — I10 Essential (primary) hypertension: Secondary | ICD-10-CM | POA: Diagnosis not present

## 2014-03-13 MED ORDER — MIDODRINE HCL 5 MG PO TABS: 5.0000 mg | ORAL_TABLET | Freq: Three times a day (TID) | ORAL | Status: DC

## 2014-03-13 NOTE — Progress Notes (Signed)
Patient ID: [name], female   DOB: 06-Mar-1956, 58 y.o.   MRN: [id number]     Clinical Summary [name] is a 58 y.o.female seen today for follow up of the following medical problems.   1. Recurrent syncope - followed by Dr Ladona Ridgel, according to notes prior loop recorder showed no clear arrhythmias - thought to be neurally mediated syncope secondary to vasodepression according to notes, has been started on florinef daily and midodrine prn - report frequent problems with LE edema, takes prn lasix.    2. HTN - reports labile blood pressures at home, overall few low pressures since being started on florinef and prn midodrine  3. Chest pain - normal cath in 2013, normal stress test 2013 - comes and goes for several years. Occurs sporadically, does tend to resolve with NG. On average once every 6 months. Ongoing for several years. No recent episodes.   4. HL - 09/2013 TC 187 TG 100 HDL 65 LDL 102 - compliant with statin Past Medical History  Diagnosis Date  . Anxiety   . Herpes simplex infection   . Osteoporosis   . Hypertension   . Dyslipidemia   . Depression   . Asthma   . Fibromyalgia   . GERD (gastroesophageal reflux disease)   . Head injury, unspecified   . Supraventricular tachycardia   . Syncope and collapse     s/p ILR; no arhythmogenic cause identified  . Coronary artery disease     reported hx of "MI";  Echo 2009 with normal LVF;  Myoview 05/2011: no ischemia  . Spondylolisthesis   . Spinal stenosis of lumbar region   . Hyperlipidemia   . Bipolar 1 disorder   . Insomnia   . Arthritis     RHEUMATOID  . Esophageal stricture   . Myocardial infarction   . Anginal pain     last time   . Status post placement of implantable loop recorder   . Sleep apnea     ? neg  . COPD (chronic obstructive pulmonary disease)   . Shortness of breath   . Pneumonia     hx  . History of kidney stones   . UTI (lower urinary tract infection)   . H/O hiatal hernia   .  Seizures     last 3 weeks ago- prescribed keppra -never took didnt like side effects read about online     Allergies  Allergen Reactions  . Codeine Other (See Comments)    "I will have a heart attack."  . Morphine And Related Other (See Comments)    "It will cause me to have a heart attack."  . Lyrica [Pregabalin] Swelling and Other (See Comments)    Weight gain  . Neurontin [Gabapentin] Swelling     Current Outpatient Prescriptions  Medication Sig Dispense Refill  . acyclovir (ZOVIRAX) 200 MG capsule TAKE 1 CAPSULE 5 TIMES DAILY. 30 capsule 0  . albuterol (PROVENTIL HFA;VENTOLIN HFA) 108 (90 BASE) MCG/ACT inhaler Inhale 2 puffs into the lungs every 6 (six) hours as needed for wheezing or shortness of breath.    [name] albuterol (PROVENTIL) (2.5 MG/3ML) 0.083% nebulizer solution Take 3 mLs (2.5 mg total) by nebulization every 6 (six) hours as needed for wheezing or shortness of breath. 150 mL 1  . amoxicillin (AMOXIL) 875 MG tablet Take 1 tablet (875 mg total) by mouth 2 (two) times daily. 20 tablet 0  . baclofen (LIORESAL) 10 MG tablet Take 1 tablet (10 mg total) by  mouth 3 (three) times daily. 90 each 2  . busPIRone (BUSPAR) 5 MG tablet Take 5 mg by mouth 2 (two) times daily.     . butalbital-acetaminophen-caffeine (FIORICET, ESGIC) 50-325-40 MG per tablet TAKE 1 TABLET TWICE DAILY AS NEEDED FOR TENSION HEADACHE. 60 tablet 0  . CRESTOR 10 MG tablet TAKE 1 TABLET ONCE DAILY. 30 tablet 4  . CYMBALTA 60 MG capsule Take 60 mg by mouth at bedtime.     [name] dexlansoprazole (DEXILANT) 60 MG capsule Take 60 mg by mouth daily.    . diazepam (VALIUM) 5 MG tablet TAKE 1 TABLET EVERY 12 HOURS AS NEEDED FOR ANXIETY. 30 tablet 0  . escitalopram (LEXAPRO) 20 MG tablet Take 1 tablet (20 mg total) by mouth daily. 90 tablet 3  . fludrocortisone (FLORINEF) 0.1 MG tablet Take 1 tablet (0.1 mg total) by mouth daily. 30 tablet 6  . furosemide (LASIX) 40 MG tablet Take 1 tablet twice daily 60 tablet 3  .  haloperidol (HALDOL) 0.5 MG tablet Take 0.5 mg by mouth 2 (two) times daily.     [name] HYDROcodone-homatropine (HYCODAN) 5-1.5 MG/5ML syrup Take 5 mLs by mouth every 8 (eight) hours as needed for cough. 120 mL 0  . levETIRAcetam (KEPPRA) 500 MG tablet Take 500 mg by mouth. Patient is currently not taking. (11/22/13)    . methocarbamol (ROBAXIN) 500 MG tablet Take 1 tablet (500 mg total) by mouth every 6 (six) hours as needed for muscle spasms. 30 tablet 0  . midodrine (PROAMATINE) 5 MG tablet Take 5 mg by mouth 4 (four) times daily as needed (blood pressure lower than 110).    . nitroGLYCERIN (NITROLINGUAL) 0.4 MG/SPRAY spray USE (1) SPRAY UNDER TONGUE AS NEEDED FOR CHEST PAIN. 12 g 2  . nitroGLYCERIN (NITROLINGUAL) 0.4 MG/SPRAY spray Place 1 spray under the tongue every 5 (five) minutes x 3 doses as needed for chest pain. 12 g 3  . oxyCODONE-acetaminophen (PERCOCET/ROXICET) 5-325 MG per tablet Take 1 tablet by mouth every 4 (four) hours as needed for severe pain.     [name] oxyCODONE-acetaminophen (ROXICET) 5-325 MG per tablet Take 1-2 tablets by mouth every 4 (four) hours as needed. 60 tablet 0  . PROAIR HFA 108 (90 BASE) MCG/ACT inhaler INHALE 2 PUFFS EVERY 6 HOURS AS NEEDED. 8.5 g 4  . ranitidine (ZANTAC) 150 MG tablet Take 150 mg by mouth at bedtime.    . rosuvastatin (CRESTOR) 20 MG tablet Take 10 mg by mouth daily.    . traZODone (DESYREL) 100 MG tablet Take 200 mg by mouth daily.      . valACYclovir (VALTREX) 1000 MG tablet TAKE (1/2) TABLET BY MOUTH TWICE DAILY. 30 tablet 0  . valACYclovir (VALTREX) 500 MG tablet TAKE (1) TABLET TWICE DAILY. 60 tablet 1   No current facility-administered medications for this visit.     Past Surgical History  Procedure Laterality Date  . Insertion of implatable loop recorder  08/11/2007    [name]  . Head up tilt table testing  06/15/2007    [name]  . Back surgery    . Doppler echocardiography  2009  . Tubal ligation    . Cholecystectomy    .  Cystoscopy      stone  . Hemorrhoid surgery    . Breast surgery      lumpectomy  . Posterior cervical fusion/foraminotomy N/A 12/19/2013    Procedure: RIGHT C3-4.C4-5 AND C5-6 FORAMINOTOMIES;  Surgeon: Kerrin Champagne, MD;  Location: [facility] OR;  Service: Orthopedics;  Laterality: N/A;     Allergies  Allergen Reactions  . Codeine Other (See Comments)    "I will have a heart attack."  . Morphine And Related Other (See Comments)    "It will cause me to have a heart attack."  . Lyrica [Pregabalin] Swelling and Other (See Comments)    Weight gain  . Neurontin [Gabapentin] Swelling      Family History  Problem Relation Age of Onset  . Heart attack Father   . Cancer Father   . Mental illness Father   . Heart attack Brother     stents  . Cancer Mother   . Mental illness Mother   . Colon cancer Maternal Aunt   . Stomach cancer Neg Hx      Social History [name] reports that she has never smoked. She has never used smokeless tobacco. [name] reports that she does not drink alcohol.   Review of Systems CONSTITUTIONAL: No weight loss, fever, chills, weakness or fatigue.  HEENT: Eyes: No visual loss, blurred vision, double vision or yellow sclerae.No hearing loss, sneezing, congestion, runny nose or sore throat.  SKIN: No rash or itching.  CARDIOVASCULAR: per HPI RESPIRATORY: No shortness of breath, cough or sputum.  GASTROINTESTINAL: No anorexia, nausea, vomiting or diarrhea. No abdominal pain or blood.  GENITOURINARY: No burning on urination, no polyuria NEUROLOGICAL: No headache, dizziness, syncope, paralysis, ataxia, numbness or tingling in the extremities. No change in bowel or bladder control.  MUSCULOSKELETAL: occas LE edema  LYMPHATICS: No enlarged nodes. No history of splenectomy.  PSYCHIATRIC: No history of depression or anxiety.  ENDOCRINOLOGIC: No reports of sweating, cold or heat intolerance. No polyuria or polydipsia.  [name]   Physical Examination p 80 bp 110/75  Wt 162 lbs BMI 31 Gen: resting comfortably, no acute distress HEENT: no scleral icterus, pupils equal round and reactive, no palptable cervical adenopathy,  CV: RRR, no m/r/g, no JVD, no carotid bruits Resp: Clear to auscultation bilaterally GI: abdomen is soft, non-tender, non-distended, normal bowel sounds, no hepatosplenomegaly MSK: extremities are warm, no edema.  Skin: warm, no rash Neuro:  no focal deficits Psych: appropriate affect   Diagnostic Studies 05/2011 Lexiscan MPI No ischemia   Jan 2009 Echo LEFT VENTRICLE: - Left ventricular size was normal. - Overall left ventricular systolic function was normal. - There were no left ventricular regional wall motion abnormalities. - Left ventricular wall thickness was normal.  AORTIC VALVE: - The aortic valve was trileaflet. - Aortic valve thickness was normal.  AORTA: - The aortic root was normal in size. - The aortic arch was normal.  MITRAL VALVE: - Mitral valve structure was normal.  Doppler interpretation(s): - There was trivial mitral valvular regurgitation.  LEFT ATRIUM: - Left atrial size was normal.  PULMONARY VEINS: - The pulmonary veins were grossly normal.  RIGHT VENTRICLE: - Right ventricular size was normal. - Right ventricular systolic function was normal. - Right ventricular wall thickness was normal.  PULMONIC VALVE: - The structure of the pulmonic valve appeared to be normal.  TRICUSPID VALVE: - The tricuspid valve structure was normal.  Doppler interpretation(s): - There was no significant tricuspid valvular regurgitation.  PULMONARY ARTERY: - The pulmonary artery was normal size.  RIGHT ATRIUM: - Right atrial size was normal.  SYSTEMIC VEINS: - The inferior vena cava was normal.  PERICARDIUM: - There was no pericardial effusion.  ---------------------------------------------------------------  SUMMARY - Overall left ventricular systolic function was normal. There were no  left  ventricular regional wall motion abnormalities. - The pulmonary veins were grossly normal.   09/2011 Cath Hemodynamic Findings:  Central aortic pressure: 108/58  Left ventricular pressure: 107/10/14  Angiographic Findings:  Left main: No obstructive disease noted.  Left Anterior Descending Artery: Moderate to large sized vessel that courses to the apex. There is a moderate sized diagonal Kassi Esteve. No obstructive disease noted.  Circumflex Artery: Large, dominant artery with moderate sized first obtuse marginal Kemar Pandit and left sided posterolateral Karema Tocci with no disease noted.  Right Coronary Artery: Small, non-dominant vessel with no disease noted.  Left Ventricular Angiogram:LVEF=65%.  Impression:  1. No angiographic evidence of CAD  2. Normal LV systolic function  3. Non-cardiac chest pain  Recommendations: No further ischemic workup.  Complications: None. The patient tolerated the procedure well.      Assessment and Plan  1. Syncope - from notes about loop recorder no coorelation has been noted with arrhythmia - thought to be vasodepressor syncope, she has been on florinef daily and midodrine prn - reports frequent issues with LE edema, taking lasix. Will stop her florinef and change her midodrine from prn to scheduled. She is to present a bp log in 2 weeks.   2. HTN - plan as described above - given her syncopal episodes higher baseline blood pressures are acceptable for her  3. Hyperlipidemia - continue current statin  4. Chest pain - long history of symptoms, overall infrequent. Negative cath and stress test previously - continue CAD risk factor modicatoin  F/u 6 months  Antoine Poche, M.D.

## 2014-03-13 NOTE — Telephone Encounter (Signed)
Last ov 9/15. 

## 2014-03-13 NOTE — Patient Instructions (Signed)
Your physician recommends that you schedule a follow-up appointment in: 6 months. You will receive a reminder letter in the mail in about 4 months reminding you to call and schedule your appointment. If you don't receive this letter, please contact our office. Your physician has recommended you make the following change in your medication:  Stop florinef. Decrease midodrine 5 mg to three times daily instead of four. Continue all other medications the same. Your physician has requested that you regularly monitor and record your blood pressure readings at home for two weeks. Please use the same machine at the same time of day to check your readings and record them to bring to our office for your doctor to review.

## 2014-03-14 MED ORDER — VALACYCLOVIR HCL 500 MG PO TABS: ORAL_TABLET | ORAL | Status: DC

## 2014-03-15 ENCOUNTER — Other Ambulatory Visit: Payer: Self-pay | Admitting: Nurse Practitioner

## 2014-03-15 NOTE — Telephone Encounter (Signed)
Last seen 12/10/13  MMM  If approved route to nurse to call into [facility]  [phone] 

## 2014-03-15 NOTE — Telephone Encounter (Signed)
Please call in valium with 0 refills 

## 2014-03-16 ENCOUNTER — Ambulatory Visit: Payer: Medicare Other | Admitting: Cardiology

## 2014-03-16 ENCOUNTER — Telehealth: Payer: Self-pay | Admitting: *Deleted

## 2014-03-16 NOTE — Telephone Encounter (Signed)
Script called to [employer].

## 2014-04-05 DIAGNOSIS — R102 Pelvic and perineal pain: Secondary | ICD-10-CM | POA: Diagnosis not present

## 2014-04-05 DIAGNOSIS — N952 Postmenopausal atrophic vaginitis: Secondary | ICD-10-CM | POA: Diagnosis not present

## 2014-04-05 DIAGNOSIS — R300 Dysuria: Secondary | ICD-10-CM | POA: Diagnosis not present

## 2014-04-05 DIAGNOSIS — Z01419 Encounter for gynecological examination (general) (routine) without abnormal findings: Secondary | ICD-10-CM | POA: Diagnosis not present

## 2014-04-05 DIAGNOSIS — Z6830 Body mass index (BMI) 30.0-30.9, adult: Secondary | ICD-10-CM | POA: Diagnosis not present

## 2014-04-10 ENCOUNTER — Other Ambulatory Visit: Payer: Self-pay | Admitting: Gastroenterology

## 2014-04-10 ENCOUNTER — Other Ambulatory Visit: Payer: Self-pay | Admitting: Nurse Practitioner

## 2014-04-11 NOTE — Telephone Encounter (Signed)
Last seen 12/10/13  MMM This med not on EPIC med list

## 2014-04-17 ENCOUNTER — Other Ambulatory Visit: Payer: Self-pay | Admitting: Nurse Practitioner

## 2014-04-18 ENCOUNTER — Other Ambulatory Visit: Payer: Self-pay | Admitting: Nurse Practitioner

## 2014-04-18 ENCOUNTER — Other Ambulatory Visit: Payer: Self-pay | Admitting: *Deleted

## 2014-04-18 NOTE — Telephone Encounter (Signed)
Please call in valium with 0 refills 

## 2014-04-18 NOTE — Telephone Encounter (Signed)
Last seen 9/12/165 MMM If approved route to nurse to call into [facility]  [phone]

## 2014-04-18 NOTE — Telephone Encounter (Signed)
Script for valium called in. 

## 2014-04-26 DIAGNOSIS — M542 Cervicalgia: Secondary | ICD-10-CM | POA: Diagnosis not present

## 2014-05-02 ENCOUNTER — Telehealth: Payer: Self-pay | Admitting: *Deleted

## 2014-05-02 NOTE — Telephone Encounter (Signed)
Pt brought in BP log from [facility]OV instructions. On Dr. Verna Czech desk for review

## 2014-05-04 DIAGNOSIS — Z6830 Body mass index (BMI) 30.0-30.9, adult: Secondary | ICD-10-CM | POA: Diagnosis not present

## 2014-05-04 DIAGNOSIS — R102 Pelvic and perineal pain: Secondary | ICD-10-CM | POA: Diagnosis not present

## 2014-05-12 ENCOUNTER — Ambulatory Visit (INDEPENDENT_AMBULATORY_CARE_PROVIDER_SITE_OTHER): Payer: Medicare Other | Admitting: Nurse Practitioner

## 2014-05-12 ENCOUNTER — Encounter: Payer: Self-pay | Admitting: Nurse Practitioner

## 2014-05-12 VITALS — BP 145/86 | HR 89 | Temp 97.2°F | Ht 61.0 in | Wt 168.0 lb

## 2014-05-12 DIAGNOSIS — B009 Herpesviral infection, unspecified: Secondary | ICD-10-CM

## 2014-05-12 DIAGNOSIS — M609 Myositis, unspecified: Secondary | ICD-10-CM

## 2014-05-12 DIAGNOSIS — F411 Generalized anxiety disorder: Secondary | ICD-10-CM

## 2014-05-12 DIAGNOSIS — M791 Myalgia: Secondary | ICD-10-CM

## 2014-05-12 DIAGNOSIS — F329 Major depressive disorder, single episode, unspecified: Secondary | ICD-10-CM

## 2014-05-12 DIAGNOSIS — IMO0001 Reserved for inherently not codable concepts without codable children: Secondary | ICD-10-CM

## 2014-05-12 DIAGNOSIS — E785 Hyperlipidemia, unspecified: Secondary | ICD-10-CM | POA: Diagnosis not present

## 2014-05-12 DIAGNOSIS — F32A Depression, unspecified: Secondary | ICD-10-CM

## 2014-05-12 DIAGNOSIS — G43819 Other migraine, intractable, without status migrainosus: Secondary | ICD-10-CM | POA: Diagnosis not present

## 2014-05-12 DIAGNOSIS — I10 Essential (primary) hypertension: Secondary | ICD-10-CM | POA: Diagnosis not present

## 2014-05-12 DIAGNOSIS — M810 Age-related osteoporosis without current pathological fracture: Secondary | ICD-10-CM

## 2014-05-12 MED ORDER — DIAZEPAM 5 MG PO TABS: ORAL_TABLET | ORAL | Status: DC

## 2014-05-12 MED ORDER — BUTALBITAL-APAP-CAFFEINE 50-325-40 MG PO TABS: ORAL_TABLET | ORAL | Status: DC

## 2014-05-12 NOTE — Progress Notes (Signed)
Subjective:    Patient ID: [name], female    DOB: 1955/06/23, 59 y.o.   MRN: [id number]   Patient here today for follow up of chronic medical problems. Her only concern is weight gain- she has not been very active lately and is not watching her diet at all.   Hyperlipidemia This is a chronic problem. The current episode started more than 1 year ago. The problem is controlled. Recent lipid tests were reviewed and are normal. Exacerbating diseases include obesity. She has no history of diabetes or hypothyroidism. Pertinent negatives include no chest pain, myalgias or shortness of breath. Current antihyperlipidemic treatment includes statins. The current treatment provides moderate improvement of lipids. Compliance problems include adherence to diet and adherence to exercise.   Hypertension Associated symptoms include anxiety and headaches. Pertinent negatives include no chest pain, palpitations or shortness of breath.  Anxiety Symptoms include nervous/anxious behavior. Patient reports no chest pain, palpitations or shortness of breath.    Gastrophageal Reflux She reports no chest pain or no sore throat. Pertinent negatives include no fatigue.  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 Pt states she has had 7 outbreaks in the last two months.  Pt states she is having a lot of stress with family issues and psychiatrist has taken pt off her Valium 10 mg. Urinary urgency vesicare helps but still has to void frequently. HSV Takes valtrex daily- no recent outbreaks insomnia trazadone helps her lsleep at night Bipolar disorder On halidol that keeps her pretty calm- no side effects seizures keppra dialy- no recent seizures- last seizure was about 6 months ago.   * Pt's ortho MD writes her percocet rx for her. Pt states she is going to have to have neck surgery in the future. The neck is also causing her to have migraines. She states she takes Fioricet when these  migraines occur.  States she has 3-4 migraines a week.    Review of Systems  Constitutional: Negative for fatigue.  HENT: Negative for sore throat.   Eyes: Negative.   Respiratory: Negative.  Negative for shortness of breath.   Cardiovascular: Negative.  Negative for chest pain and palpitations.  Gastrointestinal: Negative.   Endocrine: Negative.   Genitourinary: Negative.   Musculoskeletal: Negative.  Negative for myalgias.  Neurological: Positive for headaches.  Hematological: Negative.   Psychiatric/Behavioral: The patient is nervous/anxious.   All other systems reviewed and are negative.      Objective:   Physical Exam  Constitutional: She is oriented to person, place, and time. She appears well-developed and well-nourished. No distress.  HENT:  Head: Normocephalic and atraumatic.  Right Ear: External ear normal.  Mouth/Throat: Oropharynx is clear and moist.  Eyes: Pupils are equal, round, and reactive to light.  Neck: Normal range of motion. Neck supple. No thyromegaly present.  Cardiovascular: Normal rate, regular rhythm, normal heart sounds and intact distal pulses.   No murmur heard. Pulmonary/Chest: Effort normal and breath sounds normal. No respiratory distress. She has no wheezes.  Abdominal: Soft. Bowel sounds are normal. She exhibits no distension. There is no tenderness.  Musculoskeletal: Normal range of motion. She exhibits no edema or tenderness.  Neurological: She is alert and oriented to person, place, and time. She has normal reflexes. No cranial nerve deficit.  Skin: Skin is warm and dry.  Psychiatric: She has a normal mood and affect. Her behavior is normal. Judgment and thought content normal.  Vitals reviewed.  BP 145/86 mmHg  Pulse 89  Temp(Src)  97.2 F (36.2 C) (Oral)  Ht 5' 1"  (1.549 m)  Wt 168 lb (76.204 kg)  BMI 31.76 kg/m2         Assessment & Plan:   1. Essential hypertension   2. Osteoporosis   3. Myalgia and myositis   4. Herpes  simplex virus (HSV) infection   5. Hyperlipidemia with target LDL less than 100   6. Depression   7. Anxiety state   8. Other migraine without status migrainosus, intractable    Meds ordered this encounter  Medications  . butalbital-acetaminophen-caffeine (FIORICET, ESGIC) 50-325-40 MG per tablet    Sig: TAKE 1 TABLET TWICE DAILY AS NEEDED FOR TENSION HEADACHE.    Dispense:  60 tablet    Refill:  0    Order Specific Question:  Supervising Provider    Answer:  Chipper Herb [[id number]]  . diazepam (VALIUM) 5 MG tablet    Sig: TAKE 1 TABLET EVERY 12 HOURS AS NEEDED FOR ANXIETY.    Dispense:  60 tablet    Refill:  2    Do not filled until 05/17/14    Order Specific Question:  Supervising Provider    Answer:  Chipper Herb [[id number]]   Orders Placed This Encounter  Procedures  . CMP14+EGFR  . NMR, lipoprofile   Health maintenance reviewed Labs pending Diet and exercise encouraged RTOI in 3 months  [name] Hassell Done, FNP

## 2014-05-12 NOTE — Patient Instructions (Signed)
Exercise to Stay Healthy Exercise helps you become and stay healthy. EXERCISE IDEAS AND TIPS Choose exercises that:  You enjoy.  Fit into your day. You do not need to exercise really hard to be healthy. You can do exercises at a slow or medium level and stay healthy. You can:  Stretch before and after working out.  Try yoga, Pilates, or tai chi.  Lift weights.  Walk fast, swim, jog, run, climb stairs, bicycle, dance, or rollerskate.  Take aerobic classes. Exercises that burn about 150 calories:  Running 1  miles in 15 minutes.  Playing volleyball for 45 to 60 minutes.  Washing and waxing a car for 45 to 60 minutes.  Playing touch football for 45 minutes.  Walking 1  miles in 35 minutes.  Pushing a stroller 1  miles in 30 minutes.  Playing basketball for 30 minutes.  Raking leaves for 30 minutes.  Bicycling 5 miles in 30 minutes.  Walking 2 miles in 30 minutes.  Dancing for 30 minutes.  Shoveling snow for 15 minutes.  Swimming laps for 20 minutes.  Walking up stairs for 15 minutes.  Bicycling 4 miles in 15 minutes.  Gardening for 30 to 45 minutes.  Jumping rope for 15 minutes.  Washing windows or floors for 45 to 60 minutes. Document Released: 04/19/2010 Document Revised: 06/09/2011 Document Reviewed: 04/19/2010 Exit[employer] Patient Information 2015 [employer]. This information is not intended to replace advice given to you by your health care provider. Make sure you discuss any questions you have with your health care provider.  

## 2014-05-13 LAB — CMP14+EGFR
ALK PHOS: 104 IU/L (ref 39–117)
ALT: 19 IU/L (ref 0–32)
AST: 21 IU/L (ref 0–40)
Albumin/Globulin Ratio: 1.4 (ref 1.1–2.5)
Albumin: 4 g/dL (ref 3.5–5.5)
BUN / CREAT RATIO: 14 (ref 9–23)
BUN: 12 mg/dL (ref 6–24)
Bilirubin Total: <0.2 mg/dL (ref 0.0–1.2)
CHLORIDE: 101 mmol/L (ref 97–108)
CO2: 27 mmol/L (ref 18–29)
Calcium: 9.6 mg/dL (ref 8.7–10.2)
Creatinine, Ser: 0.87 mg/dL (ref 0.57–1.00)
GFR calc Af Amer: 86 mL/min/{1.73_m2} (ref >59)
GFR calc non Af Amer: 74 mL/min/{1.73_m2} (ref >59)
GLUCOSE: 136 mg/dL — AB (ref 65–99)
Globulin, Total: 2.8 g/dL (ref 1.5–4.5)
Potassium: 4.4 mmol/L (ref 3.5–5.2)
Sodium: 141 mmol/L (ref 134–144)
Total Protein: 6.8 g/dL (ref 6.0–8.5)

## 2014-05-13 LAB — NMR, LIPOPROFILE
CHOLESTEROL: 180 mg/dL (ref 100–199)
HDL Cholesterol by NMR: 59 mg/dL (ref >39)
HDL Particle Number: 37.4 umol/L (ref >=30.5)
LDL PARTICLE NUMBER: 1355 nmol/L — AB (ref <1000)
LDL Size: 21.1 nm (ref >20.5)
LDL-C: 89 mg/dL (ref 0–99)
LP-IR SCORE: 54 — AB (ref <=45)
Small LDL Particle Number: 587 nmol/L — ABNORMAL HIGH (ref <=527)
Triglycerides by NMR: 159 mg/dL — ABNORMAL HIGH (ref 0–149)

## 2014-05-25 DIAGNOSIS — F4310 Post-traumatic stress disorder, unspecified: Secondary | ICD-10-CM | POA: Diagnosis not present

## 2014-05-29 ENCOUNTER — Telehealth: Payer: Self-pay | Admitting: Neurology

## 2014-05-29 ENCOUNTER — Encounter: Payer: Medicare Other | Admitting: Neurology

## 2014-05-29 NOTE — Telephone Encounter (Signed)
This patient did not show up for a nerve conduction and EMG evaluation today.

## 2014-05-30 ENCOUNTER — Telehealth: Payer: Self-pay | Admitting: *Deleted

## 2014-05-30 ENCOUNTER — Encounter: Payer: Self-pay | Admitting: Neurology

## 2014-05-30 NOTE — Telephone Encounter (Signed)
Pt reports feeling fine no symptoms. Has no complaints. Will forward to Dr. Wyline Mood

## 2014-05-30 NOTE — Telephone Encounter (Signed)
-----   Message from Antoine Poche, MD sent at 05/30/2014 10:17 AM EST ----- Please follow up on patients symptoms. BP log noted some occasionaly low bp's, we may need to increase her meds. Please update me on symptoms.   Dominga Ferry MD

## 2014-05-31 ENCOUNTER — Encounter: Payer: Self-pay | Admitting: Neurology

## 2014-06-07 ENCOUNTER — Other Ambulatory Visit: Payer: Self-pay | Admitting: Nurse Practitioner

## 2014-06-07 NOTE — Telephone Encounter (Signed)
Last seen 05/12/14 MMM 

## 2014-06-12 ENCOUNTER — Other Ambulatory Visit: Payer: Self-pay | Admitting: Nurse Practitioner

## 2014-06-14 ENCOUNTER — Encounter: Payer: Self-pay | Admitting: Neurology

## 2014-06-14 ENCOUNTER — Ambulatory Visit (INDEPENDENT_AMBULATORY_CARE_PROVIDER_SITE_OTHER): Payer: Self-pay | Admitting: Neurology

## 2014-06-14 ENCOUNTER — Ambulatory Visit (INDEPENDENT_AMBULATORY_CARE_PROVIDER_SITE_OTHER): Payer: Medicare Other | Admitting: Neurology

## 2014-06-14 DIAGNOSIS — M542 Cervicalgia: Secondary | ICD-10-CM

## 2014-06-14 DIAGNOSIS — M47812 Spondylosis without myelopathy or radiculopathy, cervical region: Secondary | ICD-10-CM

## 2014-06-14 NOTE — Procedures (Signed)
     HISTORY:  [name] is a 59 year old patient with a history of a multilevel cervical laminectomy that was done about 4 months ago. The patient has had persistent right shoulder and neck discomfort. She indicates that prior to surgery, she was having some discomfort down the right arm. The patient is being evaluated for her ongoing pain issue. She denies any discomfort on the left side.  NERVE CONDUCTION STUDIES:  Nerve conduction studies were performed on both upper extremities. The distal motor latencies and motor amplitudes for the median and ulnar nerves were within normal limits. The F wave latencies and nerve conduction velocities for these nerves were also normal. The sensory latencies for the median and ulnar nerves were normal.   EMG STUDIES:  EMG study was performed on the right upper extremity:  The first dorsal interosseous muscle reveals 2 to 4 K units with full recruitment. No fibrillations or positive waves were noted. The abductor pollicis brevis muscle reveals 2 to 4 K units with full recruitment. No fibrillations or positive waves were noted. The extensor indicis proprius muscle reveals 1 to 3 K units with full recruitment. No fibrillations or positive waves were noted. The pronator teres muscle reveals 2 to 3 K units with full recruitment. No fibrillations or positive waves were noted. The biceps muscle reveals 1 to 2 K units with full recruitment. No fibrillations or positive waves were noted. The triceps muscle reveals 2 to 4 K units with full recruitment. No fibrillations or positive waves were noted. The anterior deltoid muscle reveals 2 to 3 K units with full recruitment. No fibrillations or positive waves were noted. The cervical paraspinal muscles were tested at 2 levels. No abnormalities of insertional activity were seen at either level tested. There was fair relaxation.   IMPRESSION:  Nerve conduction studies done on both upper extremities were  within normal limits. No evidence of a neuropathy is seen. EMG of the right upper extremity was unremarkable, without evidence of an overlying cervical radiculopathy.  Marlan Palau MD 06/14/2014 4:44 PM  [facility] [address] [place], [place] [address]  Phone [phone] Fax [phone]

## 2014-06-14 NOTE — Progress Notes (Signed)
Please refer to EMG and nerve conduction study procedure note. 

## 2014-06-15 ENCOUNTER — Telehealth: Payer: Self-pay | Admitting: Neurology

## 2014-06-15 MED ORDER — LEVETIRACETAM 500 MG PO TABS: 500.0000 mg | ORAL_TABLET | Freq: Two times a day (BID) | ORAL | Status: DC

## 2014-06-15 NOTE — Telephone Encounter (Signed)
Previous [facility] patient.  Has been evaluated by Dr Anne Hahn.

## 2014-06-15 NOTE — Telephone Encounter (Signed)
[facility] is calling stating they need a refill for levETIRAcetam (KEPPRA) 500 MG tablet to be sent in.  Please advise.

## 2014-06-20 DIAGNOSIS — F4310 Post-traumatic stress disorder, unspecified: Secondary | ICD-10-CM | POA: Diagnosis not present

## 2014-06-27 DIAGNOSIS — Z1231 Encounter for screening mammogram for malignant neoplasm of breast: Secondary | ICD-10-CM | POA: Diagnosis not present

## 2014-06-28 DIAGNOSIS — M542 Cervicalgia: Secondary | ICD-10-CM | POA: Diagnosis not present

## 2014-06-28 DIAGNOSIS — M1612 Unilateral primary osteoarthritis, left hip: Secondary | ICD-10-CM | POA: Diagnosis not present

## 2014-07-04 ENCOUNTER — Other Ambulatory Visit: Payer: Self-pay | Admitting: Family

## 2014-07-04 ENCOUNTER — Other Ambulatory Visit: Payer: Self-pay | Admitting: Nurse Practitioner

## 2014-07-04 ENCOUNTER — Other Ambulatory Visit: Payer: Self-pay | Admitting: Neurology

## 2014-07-04 ENCOUNTER — Other Ambulatory Visit: Payer: Self-pay | Admitting: Cardiology

## 2014-07-06 DIAGNOSIS — R928 Other abnormal and inconclusive findings on diagnostic imaging of breast: Secondary | ICD-10-CM | POA: Diagnosis not present

## 2014-07-07 ENCOUNTER — Other Ambulatory Visit: Payer: Self-pay | Admitting: Nurse Practitioner

## 2014-07-07 DIAGNOSIS — N6310 Unspecified lump in the right breast, unspecified quadrant: Secondary | ICD-10-CM

## 2014-07-10 DIAGNOSIS — N6011 Diffuse cystic mastopathy of right breast: Secondary | ICD-10-CM | POA: Diagnosis not present

## 2014-07-10 DIAGNOSIS — N6489 Other specified disorders of breast: Secondary | ICD-10-CM | POA: Diagnosis not present

## 2014-07-10 DIAGNOSIS — R928 Other abnormal and inconclusive findings on diagnostic imaging of breast: Secondary | ICD-10-CM | POA: Diagnosis not present

## 2014-07-11 ENCOUNTER — Encounter: Payer: Self-pay | Admitting: Nurse Practitioner

## 2014-07-11 DIAGNOSIS — M1612 Unilateral primary osteoarthritis, left hip: Secondary | ICD-10-CM | POA: Diagnosis not present

## 2014-07-24 ENCOUNTER — Ambulatory Visit (INDEPENDENT_AMBULATORY_CARE_PROVIDER_SITE_OTHER): Payer: Medicare Other | Admitting: Nurse Practitioner

## 2014-07-24 ENCOUNTER — Encounter: Payer: Self-pay | Admitting: Nurse Practitioner

## 2014-07-24 VITALS — BP 142/82 | HR 74 | Temp 98.5°F | Ht 61.0 in | Wt 174.0 lb

## 2014-07-24 DIAGNOSIS — K2100 Gastro-esophageal reflux disease with esophagitis, without bleeding: Secondary | ICD-10-CM

## 2014-07-24 DIAGNOSIS — M5412 Radiculopathy, cervical region: Secondary | ICD-10-CM

## 2014-07-24 DIAGNOSIS — M810 Age-related osteoporosis without current pathological fracture: Secondary | ICD-10-CM | POA: Diagnosis not present

## 2014-07-24 DIAGNOSIS — F329 Major depressive disorder, single episode, unspecified: Secondary | ICD-10-CM

## 2014-07-24 DIAGNOSIS — B009 Herpesviral infection, unspecified: Secondary | ICD-10-CM

## 2014-07-24 DIAGNOSIS — E785 Hyperlipidemia, unspecified: Secondary | ICD-10-CM

## 2014-07-24 DIAGNOSIS — I10 Essential (primary) hypertension: Secondary | ICD-10-CM

## 2014-07-24 DIAGNOSIS — G4730 Sleep apnea, unspecified: Secondary | ICD-10-CM | POA: Diagnosis not present

## 2014-07-24 DIAGNOSIS — R7309 Other abnormal glucose: Secondary | ICD-10-CM | POA: Diagnosis not present

## 2014-07-24 DIAGNOSIS — F32A Depression, unspecified: Secondary | ICD-10-CM

## 2014-07-24 DIAGNOSIS — F411 Generalized anxiety disorder: Secondary | ICD-10-CM | POA: Diagnosis not present

## 2014-07-24 DIAGNOSIS — R739 Hyperglycemia, unspecified: Secondary | ICD-10-CM

## 2014-07-24 LAB — POCT GLYCOSYLATED HEMOGLOBIN (HGB A1C): Hemoglobin A1C: 6.3

## 2014-07-24 MED ORDER — VALACYCLOVIR HCL 1 G PO TABS: ORAL_TABLET | ORAL | Status: DC

## 2014-07-24 MED ORDER — ROSUVASTATIN CALCIUM 20 MG PO TABS: 10.0000 mg | ORAL_TABLET | Freq: Every day | ORAL | Status: DC

## 2014-07-24 MED ORDER — RANITIDINE HCL 150 MG PO TABS: 150.0000 mg | ORAL_TABLET | Freq: Every day | ORAL | Status: DC

## 2014-07-24 MED ORDER — DULOXETINE HCL 60 MG PO CPEP: 60.0000 mg | ORAL_CAPSULE | Freq: Every day | ORAL | Status: DC

## 2014-07-24 MED ORDER — DIAZEPAM 5 MG PO TABS: ORAL_TABLET | ORAL | Status: DC

## 2014-07-24 NOTE — Patient Instructions (Signed)
Exercise to Stay Healthy Exercise helps you become and stay healthy. EXERCISE IDEAS AND TIPS Choose exercises that:  You enjoy.  Fit into your day. You do not need to exercise really hard to be healthy. You can do exercises at a slow or medium level and stay healthy. You can:  Stretch before and after working out.  Try yoga, Pilates, or tai chi.  Lift weights.  Walk fast, swim, jog, run, climb stairs, bicycle, dance, or rollerskate.  Take aerobic classes. Exercises that burn about 150 calories:  Running 1  miles in 15 minutes.  Playing volleyball for 45 to 60 minutes.  Washing and waxing a car for 45 to 60 minutes.  Playing touch football for 45 minutes.  Walking 1  miles in 35 minutes.  Pushing a stroller 1  miles in 30 minutes.  Playing basketball for 30 minutes.  Raking leaves for 30 minutes.  Bicycling 5 miles in 30 minutes.  Walking 2 miles in 30 minutes.  Dancing for 30 minutes.  Shoveling snow for 15 minutes.  Swimming laps for 20 minutes.  Walking up stairs for 15 minutes.  Bicycling 4 miles in 15 minutes.  Gardening for 30 to 45 minutes.  Jumping rope for 15 minutes.  Washing windows or floors for 45 to 60 minutes. Document Released: 04/19/2010 Document Revised: 06/09/2011 Document Reviewed: 04/19/2010 Exit[employer] Patient Information 2015 [employer]. This information is not intended to replace advice given to you by your health care provider. Make sure you discuss any questions you have with your health care provider.  

## 2014-07-24 NOTE — Progress Notes (Signed)
Subjective:    Patient ID: [name], female    DOB: Feb 06, 1956, 59 y.o.   MRN: [id number]   Patient here today for follow up of chronic medical problems. Her only concern is weight gain- she has not been very active lately and is not watching her diet at all.   Hyperlipidemia This is a chronic problem. The current episode started more than 1 year ago. The problem is controlled. Recent lipid tests were reviewed and are normal. Exacerbating diseases include obesity. She has no history of diabetes or hypothyroidism. Pertinent negatives include no chest pain, myalgias or shortness of breath. Current antihyperlipidemic treatment includes statins. The current treatment provides moderate improvement of lipids. Compliance problems include adherence to diet and adherence to exercise.  Risk factors for coronary artery disease include dyslipidemia, hypertension, obesity and post-menopausal.  Hypertension This is a chronic problem. The current episode started more than 1 year ago. The problem is unchanged. Associated symptoms include anxiety and headaches. Pertinent negatives include no chest pain, palpitations or shortness of breath. Risk factors for coronary artery disease include dyslipidemia, obesity, post-menopausal state and sedentary lifestyle. Past treatments include nothing (blood pressure drops frequently and they don't want to put her on anything.). The current treatment provides moderate improvement. Compliance problems include diet and exercise.  Hypertensive end-organ damage includes CAD/MI.  Anxiety Symptoms include nervous/anxious behavior. Patient reports no chest pain, palpitations or shortness of breath.    Gastrophageal Reflux She reports no chest pain or no sore throat. Pertinent negatives include no fatigue.  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 Pt states she has had 7 outbreaks in the last two months.  Pt states she is having a lot of stress with family  issues and psychiatrist has taken pt off her Valium 10 mg. Urinary urgency vesicare helps but still has to void frequently. HSV Takes valtrex daily- no recent outbreaks insomnia trazadone helps her lsleep at night Bipolar disorder On halidol that keeps her pretty calm- no side effects seizures keppra dialy- no recent seizures- last seizure was about 6 months ago.     Review of Systems  Constitutional: Negative for fatigue.  HENT: Negative for sore throat.   Eyes: Negative.   Respiratory: Negative.  Negative for shortness of breath.   Cardiovascular: Negative.  Negative for chest pain and palpitations.  Gastrointestinal: Negative.   Endocrine: Negative.   Genitourinary: Negative.   Musculoskeletal: Negative.  Negative for myalgias.  Neurological: Positive for headaches.  Hematological: Negative.   Psychiatric/Behavioral: The patient is nervous/anxious.   All other systems reviewed and are negative.      Objective:   Physical Exam  Constitutional: She is oriented to person, place, and time. She appears well-developed and well-nourished. No distress.  HENT:  Head: Normocephalic and atraumatic.  Right Ear: External ear normal.  Mouth/Throat: Oropharynx is clear and moist.  Eyes: Pupils are equal, round, and reactive to light.  Neck: Normal range of motion. Neck supple. No thyromegaly present.  Cardiovascular: Normal rate, regular rhythm, normal heart sounds and intact distal pulses.   No murmur heard. Pulmonary/Chest: Effort normal and breath sounds normal. No respiratory distress. She has no wheezes.  Abdominal: Soft. Bowel sounds are normal. She exhibits no distension. There is no tenderness.  Musculoskeletal: Normal range of motion. She exhibits no edema or tenderness.  Neurological: She is alert and oriented to person, place, and time. She has normal reflexes. No cranial nerve deficit.  Skin: Skin is warm and dry.  Psychiatric:  She has a normal mood and affect. Her  behavior is normal. Judgment and thought content normal.  Vitals reviewed.   BP 142/82 mmHg  Pulse 74  Temp(Src) 98.5 F (36.9 C) (Oral)  Ht 5' 1"  (1.549 m)  Wt 174 lb (78.926 kg)  BMI 32.89 kg/m2   Results for orders placed or performed in visit on 07/24/14  POCT glycosylated hemoglobin (Hb A1C)  Result Value Ref Range   Hemoglobin A1C 6.3           Assessment & Plan:   1. Essential hypertension Do not add slat to diet - CMP14+EGFR  2. Hyperlipidemia with target LDL less than 100 Low fat diet - NMR, lipoprofile  3. Elevated blood sugar Watch carbs in diet - POCT glycosylated hemoglobin (Hb A1C)  4. Gastroesophageal reflux disease with esophagitis Avoid spicy foods Do not eat 2 hours prior to bedtime   5. Cervical radiculitis  6. Osteoporosis  7. Sleep apnea 8. Depression Stress management  9. Anxiety state  10. Herpes simplex virus (HSV) infection  Meds ordered this encounter  Medications  . valACYclovir (VALTREX) 1000 MG tablet    Sig: TAKE (1/2) TABLET BY MOUTH TWICE DAILY.    Dispense:  30 tablet    Refill:  5    Order Specific Question:  Supervising Provider    Answer:  Chipper Herb [[id number]]  . rosuvastatin (CRESTOR) 20 MG tablet    Sig: Take 0.5 tablets (10 mg total) by mouth daily.    Dispense:  30 tablet    Refill:  5    Order Specific Question:  Supervising Provider    Answer:  Chipper Herb [[id number]]  . ranitidine (ZANTAC) 150 MG tablet    Sig: Take 1 tablet (150 mg total) by mouth at bedtime.    Dispense:  30 tablet    Refill:  5    Order Specific Question:  Supervising Provider    Answer:  Chipper Herb [[id number]]  . DULoxetine (CYMBALTA) 60 MG capsule    Sig: Take 1 capsule (60 mg total) by mouth at bedtime.    Dispense:  30 capsule    Refill:  5    Order Specific Question:  Supervising Provider    Answer:  Chipper Herb [[id number]]  . diazepam (VALIUM) 5 MG tablet    Sig: TAKE 1 TABLET EVERY 12 HOURS AS NEEDED FOR ANXIETY.     Dispense:  60 tablet    Refill:  2    Order Specific Question:  Supervising Provider    Answer:  Chipper Herb [[id number]]      Labs pending Health maintenance reviewed Diet and exercise encouraged Continue all meds Follow up  In 6 months   [place], FNP

## 2014-07-27 LAB — CMP14+EGFR
ALT: 17 IU/L (ref 0–32)
AST: 15 IU/L (ref 0–40)
Albumin/Globulin Ratio: 1.6 (ref 1.1–2.5)
Albumin: 3.6 g/dL (ref 3.5–5.5)
Alkaline Phosphatase: 93 IU/L (ref 39–117)
BUN/Creatinine Ratio: 12 (ref 9–23)
BUN: 9 mg/dL (ref 6–24)
Bilirubin Total: <0.2 mg/dL (ref 0.0–1.2)
CO2: 26 mmol/L (ref 18–29)
CREATININE: 0.76 mg/dL (ref 0.57–1.00)
Calcium: 9.1 mg/dL (ref 8.7–10.2)
Chloride: 107 mmol/L (ref 97–108)
GFR calc Af Amer: 101 mL/min/{1.73_m2} (ref >59)
GFR calc non Af Amer: 87 mL/min/{1.73_m2} (ref >59)
GLUCOSE: 127 mg/dL — AB (ref 65–99)
Globulin, Total: 2.2 g/dL (ref 1.5–4.5)
Potassium: 4.4 mmol/L (ref 3.5–5.2)
Sodium: 148 mmol/L — ABNORMAL HIGH (ref 134–144)
TOTAL PROTEIN: 5.8 g/dL — AB (ref 6.0–8.5)

## 2014-07-27 LAB — NMR, LIPOPROFILE
Cholesterol: 157 mg/dL (ref 100–199)
HDL Cholesterol by NMR: 47 mg/dL (ref >39)
HDL Particle Number: 33.3 umol/L (ref >=30.5)
LDL PARTICLE NUMBER: 986 nmol/L (ref <1000)
LDL SIZE: 21.2 nm (ref >20.5)
LDL-C: 63 mg/dL (ref 0–99)
LP-IR SCORE: 55 — AB (ref <=45)
SMALL LDL PARTICLE NUMBER: 372 nmol/L (ref <=527)
Triglycerides by NMR: 233 mg/dL — ABNORMAL HIGH (ref 0–149)

## 2014-08-03 ENCOUNTER — Other Ambulatory Visit: Payer: Self-pay | Admitting: Nurse Practitioner

## 2014-08-03 ENCOUNTER — Other Ambulatory Visit: Payer: Self-pay | Admitting: Family

## 2014-08-04 ENCOUNTER — Encounter: Payer: Self-pay | Admitting: Family Medicine

## 2014-08-08 ENCOUNTER — Other Ambulatory Visit: Payer: Self-pay | Admitting: Nurse Practitioner

## 2014-08-08 ENCOUNTER — Other Ambulatory Visit: Payer: Self-pay | Admitting: Internal Medicine

## 2014-08-09 ENCOUNTER — Other Ambulatory Visit: Payer: Self-pay | Admitting: Cardiology

## 2014-08-09 DIAGNOSIS — M542 Cervicalgia: Secondary | ICD-10-CM | POA: Diagnosis not present

## 2014-08-09 DIAGNOSIS — M1612 Unilateral primary osteoarthritis, left hip: Secondary | ICD-10-CM | POA: Diagnosis not present

## 2014-08-09 NOTE — Telephone Encounter (Signed)
Last seen 07/24/14  MMM If approved print

## 2014-08-10 NOTE — Telephone Encounter (Signed)
rx ready for pickup 

## 2014-08-11 ENCOUNTER — Other Ambulatory Visit: Payer: Self-pay | Admitting: Nurse Practitioner

## 2014-08-11 ENCOUNTER — Other Ambulatory Visit: Payer: Self-pay | Admitting: Internal Medicine

## 2014-08-17 DIAGNOSIS — F333 Major depressive disorder, recurrent, severe with psychotic symptoms: Secondary | ICD-10-CM | POA: Diagnosis not present

## 2014-08-20 ENCOUNTER — Encounter: Payer: Self-pay | Admitting: Cardiology

## 2014-08-20 DIAGNOSIS — G710 Muscular dystrophy: Secondary | ICD-10-CM | POA: Diagnosis not present

## 2014-08-20 DIAGNOSIS — Z79891 Long term (current) use of opiate analgesic: Secondary | ICD-10-CM | POA: Diagnosis not present

## 2014-08-20 DIAGNOSIS — K219 Gastro-esophageal reflux disease without esophagitis: Secondary | ICD-10-CM | POA: Diagnosis not present

## 2014-08-20 DIAGNOSIS — Z885 Allergy status to narcotic agent status: Secondary | ICD-10-CM | POA: Diagnosis not present

## 2014-08-20 DIAGNOSIS — R0789 Other chest pain: Secondary | ICD-10-CM | POA: Diagnosis not present

## 2014-08-20 DIAGNOSIS — J45909 Unspecified asthma, uncomplicated: Secondary | ICD-10-CM | POA: Diagnosis not present

## 2014-08-20 DIAGNOSIS — F319 Bipolar disorder, unspecified: Secondary | ICD-10-CM | POA: Diagnosis not present

## 2014-08-20 DIAGNOSIS — F4481 Dissociative identity disorder: Secondary | ICD-10-CM | POA: Diagnosis not present

## 2014-08-20 DIAGNOSIS — E780 Pure hypercholesterolemia: Secondary | ICD-10-CM | POA: Diagnosis not present

## 2014-08-20 DIAGNOSIS — G40909 Epilepsy, unspecified, not intractable, without status epilepticus: Secondary | ICD-10-CM | POA: Diagnosis not present

## 2014-08-20 DIAGNOSIS — Z780 Asymptomatic menopausal state: Secondary | ICD-10-CM | POA: Diagnosis not present

## 2014-08-20 DIAGNOSIS — F4310 Post-traumatic stress disorder, unspecified: Secondary | ICD-10-CM | POA: Diagnosis not present

## 2014-08-20 DIAGNOSIS — M545 Low back pain: Secondary | ICD-10-CM | POA: Diagnosis not present

## 2014-08-20 DIAGNOSIS — G8929 Other chronic pain: Secondary | ICD-10-CM | POA: Diagnosis not present

## 2014-08-20 DIAGNOSIS — R079 Chest pain, unspecified: Secondary | ICD-10-CM | POA: Diagnosis not present

## 2014-08-20 DIAGNOSIS — Z981 Arthrodesis status: Secondary | ICD-10-CM | POA: Diagnosis not present

## 2014-08-20 DIAGNOSIS — R112 Nausea with vomiting, unspecified: Secondary | ICD-10-CM | POA: Diagnosis not present

## 2014-08-20 DIAGNOSIS — F329 Major depressive disorder, single episode, unspecified: Secondary | ICD-10-CM | POA: Diagnosis not present

## 2014-08-20 DIAGNOSIS — G43909 Migraine, unspecified, not intractable, without status migrainosus: Secondary | ICD-10-CM | POA: Diagnosis not present

## 2014-08-20 DIAGNOSIS — Z888 Allergy status to other drugs, medicaments and biological substances status: Secondary | ICD-10-CM | POA: Diagnosis not present

## 2014-08-20 DIAGNOSIS — Z915 Personal history of self-harm: Secondary | ICD-10-CM | POA: Diagnosis not present

## 2014-08-20 DIAGNOSIS — Z79899 Other long term (current) drug therapy: Secondary | ICD-10-CM | POA: Diagnosis not present

## 2014-08-20 DIAGNOSIS — Z8249 Family history of ischemic heart disease and other diseases of the circulatory system: Secondary | ICD-10-CM | POA: Diagnosis not present

## 2014-08-20 DIAGNOSIS — I959 Hypotension, unspecified: Secondary | ICD-10-CM | POA: Diagnosis not present

## 2014-08-20 DIAGNOSIS — I209 Angina pectoris, unspecified: Secondary | ICD-10-CM | POA: Diagnosis not present

## 2014-08-21 DIAGNOSIS — R079 Chest pain, unspecified: Secondary | ICD-10-CM | POA: Diagnosis not present

## 2014-08-24 DIAGNOSIS — R079 Chest pain, unspecified: Secondary | ICD-10-CM | POA: Diagnosis not present

## 2014-08-31 ENCOUNTER — Other Ambulatory Visit: Payer: Self-pay | Admitting: Family

## 2014-08-31 ENCOUNTER — Encounter: Payer: Self-pay | Admitting: *Deleted

## 2014-08-31 ENCOUNTER — Telehealth: Payer: Self-pay | Admitting: *Deleted

## 2014-08-31 ENCOUNTER — Encounter: Payer: Self-pay | Admitting: Cardiology

## 2014-08-31 ENCOUNTER — Other Ambulatory Visit: Payer: Self-pay | Admitting: Neurology

## 2014-08-31 ENCOUNTER — Ambulatory Visit (INDEPENDENT_AMBULATORY_CARE_PROVIDER_SITE_OTHER): Payer: Medicare Other | Admitting: Cardiology

## 2014-08-31 VITALS — BP 133/80 | HR 87 | Ht 61.0 in | Wt 168.0 lb

## 2014-08-31 DIAGNOSIS — R0789 Other chest pain: Secondary | ICD-10-CM | POA: Diagnosis not present

## 2014-08-31 MED ORDER — MIDODRINE HCL 2.5 MG PO TABS: 2.5000 mg | ORAL_TABLET | Freq: Three times a day (TID) | ORAL | Status: DC

## 2014-08-31 NOTE — Telephone Encounter (Signed)
-----   Message from Antoine Poche, MD sent at 08/31/2014 12:56 PM EDT ----- Please let [name] know that we were able to get a hold of her stress test and it was normal, there is no evidence of any blockages in the arteries of her heart. She should follow up with her pcp and potentially her GI doctor for further evaluation, this does not appear to be cardiac   Dominga Ferry MD

## 2014-08-31 NOTE — Progress Notes (Signed)
Clinical Summary [name] is a 59 y.o.female seen today for follow up of the following medical problems. This is a focused visit on recent admission for chest pain.   1. Chest pain - normal cath in 2013, normal stress test 2013 - comes and goes for several years. - admit to [facility] 07/2014 with chest pain. Negative workup for ACS. She was discharged with an out patient Lexiscan which showed no evidence of ischemia.  - her symptoms prior to admission were a sharp pain midchest and to the left, 9/10. Noted bp was high, nauseous and vomiting. Pain lasted approx 30 minutes. Better with NG. Since discharge has had pressure in left chest. Worst with activity. Pain lasted 4+ hours, still ongoing this morning. No relation to food. No palpitations       Past Medical History  Diagnosis Date  . Anxiety   . Herpes simplex infection   . Osteoporosis   . Hypertension   . Dyslipidemia   . Depression   . Asthma   . Fibromyalgia   . GERD (gastroesophageal reflux disease)   . Head injury, unspecified   . Supraventricular tachycardia   . Syncope and collapse     s/p ILR; no arhythmogenic cause identified  . Coronary artery disease     reported hx of "MI";  Echo 2009 with normal LVF;  Myoview 05/2011: no ischemia  . Spondylolisthesis   . Spinal stenosis of lumbar region   . Hyperlipidemia   . Bipolar 1 disorder   . Insomnia   . Arthritis     RHEUMATOID  . Esophageal stricture   . Myocardial infarction   . Anginal pain     last time   . Status post placement of implantable loop recorder   . Sleep apnea     ? neg  . COPD (chronic obstructive pulmonary disease)   . Shortness of breath   . Pneumonia     hx  . History of kidney stones   . UTI (lower urinary tract infection)   . H/O hiatal hernia   . Seizures     last 3 weeks ago- prescribed keppra -never took didnt like side effects read about online     Allergies  Allergen Reactions  . Codeine Other (See Comments)    "I  will have a heart attack."  . Morphine And Related Other (See Comments)    "It will cause me to have a heart attack."  . Lyrica [Pregabalin] Swelling and Other (See Comments)    Weight gain  . Neurontin [Gabapentin] Swelling     Current Outpatient Prescriptions  Medication Sig Dispense Refill  . albuterol (PROVENTIL HFA;VENTOLIN HFA) 108 (90 BASE) MCG/ACT inhaler Inhale 2 puffs into the lungs every 6 (six) hours as needed for wheezing or shortness of breath.    [name] albuterol (PROVENTIL) (2.5 MG/3ML) 0.083% nebulizer solution Take 3 mLs (2.5 mg total) by nebulization every 6 (six) hours as needed for wheezing or shortness of breath. 150 mL 1  . baclofen (LIORESAL) 10 MG tablet Take 1 tablet (10 mg total) by mouth 3 (three) times daily. 90 each 2  . busPIRone (BUSPAR) 5 MG tablet Take 5 mg by mouth 2 (two) times daily.     . butalbital-acetaminophen-caffeine (FIORICET, ESGIC) 50-325-40 MG per tablet TAKE 1 TABLET TWICE DAILY AS NEEDED FOR TENSION HEADACHE. 60 tablet 0  . DEXILANT 60 MG capsule TAKE 1 CAPSULE BY MOUTH ONCE A DAY. 30 capsule 6  . diazepam (  VALIUM) 5 MG tablet TAKE 1 TABLET EVERY 12 HOURS AS NEEDED FOR ANXIETY. 60 tablet 2  . DULoxetine (CYMBALTA) 60 MG capsule Take 1 capsule (60 mg total) by mouth at bedtime. 30 capsule 5  . escitalopram (LEXAPRO) 20 MG tablet TAKE (1) TABLET BY MOUTH ONCE DAILY. 30 tablet 2  . escitalopram (LEXAPRO) 20 MG tablet TAKE (1) TABLET BY MOUTH ONCE DAILY. 30 tablet 2  . furosemide (LASIX) 40 MG tablet Take 1 tablet twice daily 60 tablet 3  . haloperidol (HALDOL) 0.5 MG tablet Take 0.5 mg by mouth 2 (two) times daily.     [name] levETIRAcetam (KEPPRA) 500 MG tablet Take 1 tablet (500 mg total) by mouth 2 (two) times daily. 180 tablet 0  . methocarbamol (ROBAXIN) 500 MG tablet Take 1 tablet (500 mg total) by mouth every 6 (six) hours as needed for muscle spasms. (Patient not taking: Reported on 07/24/2014) 30 tablet 0  . midodrine (PROAMATINE) 5 MG tablet TAKE  1 TABLET BY MOUTH 3 TIMES DAILY. 90 tablet 3  . nitroGLYCERIN (NITROLINGUAL) 0.4 MG/SPRAY spray USE (1) SPRAY UNDER TONGUE AS NEEDED FOR CHEST PAIN. 12 g 2  . oxycodone (OXY-IR) 5 MG capsule Take 10 mg by mouth as needed.     [name] PROAIR HFA 108 (90 BASE) MCG/ACT inhaler INHALE 2 PUFFS EVERY 6 HOURS AS NEEDED. 8.5 g 4  . ranitidine (ZANTAC) 150 MG tablet Take 1 tablet (150 mg total) by mouth at bedtime. 30 tablet 5  . rosuvastatin (CRESTOR) 20 MG tablet Take 0.5 tablets (10 mg total) by mouth daily. 30 tablet 5  . solifenacin (VESICARE) 5 MG tablet Take 5 mg by mouth 2 (two) times daily.    . traZODone (DESYREL) 100 MG tablet Take 200 mg by mouth daily.      . valACYclovir (VALTREX) 1000 MG tablet TAKE (1/2) TABLET BY MOUTH TWICE DAILY. 30 tablet 5  . valACYclovir (VALTREX) 500 MG tablet TAKE (1) TABLET TWICE DAILY. 60 tablet 2   No current facility-administered medications for this visit.     Past Surgical History  Procedure Laterality Date  . Insertion of implatable loop recorder  08/11/2007    [name]  . Head up tilt table testing  06/15/2007    [name]  . Back surgery    . Doppler echocardiography  2009  . Tubal ligation    . Cholecystectomy    . Cystoscopy      stone  . Hemorrhoid surgery    . Breast surgery      lumpectomy  . Posterior cervical fusion/foraminotomy N/A 12/19/2013    Procedure: RIGHT C3-4.C4-5 AND C5-6 FORAMINOTOMIES;  Surgeon: Kerrin Champagne, MD;  Location: [facility] OR;  Service: Orthopedics;  Laterality: N/A;     Allergies  Allergen Reactions  . Codeine Other (See Comments)    "I will have a heart attack."  . Morphine And Related Other (See Comments)    "It will cause me to have a heart attack."  . Lyrica [Pregabalin] Swelling and Other (See Comments)    Weight gain  . Neurontin [Gabapentin] Swelling      Family History  Problem Relation Age of Onset  . Heart attack Father   . Cancer Father   . Mental illness Father   . Heart attack Brother      stents  . Cancer Mother   . Mental illness Mother   . Colon cancer Maternal Aunt   . Stomach cancer Neg Hx  Social History Ms. Pitones reports that she has never smoked. She has never used smokeless tobacco. Ms. Owensby reports that she does not drink alcohol.   Review of Systems CONSTITUTIONAL: No weight loss, fever, chills, weakness or fatigue.  HEENT: Eyes: No visual loss, blurred vision, double vision or yellow sclerae.No hearing loss, sneezing, congestion, runny nose or sore throat.  SKIN: No rash or itching.  CARDIOVASCULAR: per HPI RESPIRATORY: No shortness of breath, cough or sputum.  GASTROINTESTINAL: No anorexia, nausea, vomiting or diarrhea. No abdominal pain or blood.  GENITOURINARY: No burning on urination, no polyuria NEUROLOGICAL: No headache, dizziness, syncope, paralysis, ataxia, numbness or tingling in the extremities. No change in bowel or bladder control.  MUSCULOSKELETAL: No muscle, back pain, joint pain or stiffness.  LYMPHATICS: No enlarged nodes. No history of splenectomy.  PSYCHIATRIC: No history of depression or anxiety.  ENDOCRINOLOGIC: No reports of sweating, cold or heat intolerance. No polyuria or polydipsia.  [name]   Physical Examination Filed Vitals:   08/31/14 0915  BP: 133/80  Pulse: 87   Filed Vitals:   08/31/14 0915  Height: 5\' 1"  (1.549 m)  Weight: 168 lb (76.204 kg)    Gen: resting comfortably, no acute distress HEENT: no scleral icterus, pupils equal round and reactive, no palptable cervical adenopathy,  CV: RRR, no m/r/g, no JVD Resp: Clear to auscultation bilaterally GI: abdomen is soft, non-tender, non-distended, normal bowel sounds, no hepatosplenomegaly MSK: extremities are warm, no edema.  Skin: warm, no rash Neuro:  no focal deficits Psych: appropriate affect   Diagnostic Studies 05/2011 Lexiscan MPI No ischemia   Jan 2009 Echo LEFT VENTRICLE: - Left ventricular size was normal. - Overall left ventricular systolic  function was normal. - There were no left ventricular regional wall motion abnormalities. - Left ventricular wall thickness was normal.  AORTIC VALVE: - The aortic valve was trileaflet. - Aortic valve thickness was normal.  AORTA: - The aortic root was normal in size. - The aortic arch was normal.  MITRAL VALVE: - Mitral valve structure was normal.  Doppler interpretation(s): - There was trivial mitral valvular regurgitation.  LEFT ATRIUM: - Left atrial size was normal.  PULMONARY VEINS: - The pulmonary veins were grossly normal.  RIGHT VENTRICLE: - Right ventricular size was normal. - Right ventricular systolic function was normal. - Right ventricular wall thickness was normal.  PULMONIC VALVE: - The structure of the pulmonic valve appeared to be normal.  TRICUSPID VALVE: - The tricuspid valve structure was normal.  Doppler interpretation(s): - There was no significant tricuspid valvular regurgitation.  PULMONARY ARTERY: - The pulmonary artery was normal size.  RIGHT ATRIUM: - Right atrial size was normal.  SYSTEMIC VEINS: - The inferior vena cava was normal.  PERICARDIUM: - There was no pericardial effusion.  ---------------------------------------------------------------  SUMMARY - Overall left ventricular systolic function was normal. There were no left ventricular regional wall motion abnormalities. - The pulmonary veins were grossly normal.   09/2011 Cath Hemodynamic Findings:  Central aortic pressure: 108/58  Left ventricular pressure: 107/10/14  Angiographic Findings:  Left main: No obstructive disease noted.  Left Anterior Descending Artery: Moderate to large sized vessel that courses to the apex. There is a moderate sized diagonal Dell Hurtubise. No obstructive disease noted.  Circumflex Artery: Large, dominant artery with moderate sized first obtuse marginal Beyonce Sawatzky and left sided posterolateral Erdine Hulen with no disease noted.  Right Coronary  Artery: Small, non-dominant vessel with no disease noted.  Left Ventricular Angiogram:LVEF=65%.  Impression:  1. No angiographic evidence  of CAD  2. Normal LV systolic function  3. Non-cardiac chest pain  Recommendations: No further ischemic workup.  Complications: None. The patient tolerated the procedure well.         Assessment and Plan   1. Chest pain - long history of symptoms with multiple negative stress tests, most recently 07/2014 Lexiscan was negative at [facility]. Cath 2013 with patent vessels - continue CAD risk factor modicatoin - no further cardiac workup at this time, asked that she follow up with her pcp for evaluation of noncardiac chest pain.   F/u 6 months   Antoine Poche, M.D.

## 2014-08-31 NOTE — Telephone Encounter (Signed)
[name] made aware

## 2014-08-31 NOTE — Patient Instructions (Signed)
Your physician recommends that you schedule a follow-up appointment WILL BE DETERMINED AFTER WE REVIEW STRESS TEST RESULTS FROM [facility]   Your physician has recommended you make the following change in your medication:   DECREASE MIDODRINE 2.5 MG 3 TIMES DAILY  CONTINUE ALL OTHER MEDICATIONS AS DIRECTED   Thank you for choosing [facility]!!

## 2014-09-05 ENCOUNTER — Encounter: Payer: Self-pay | Admitting: Family

## 2014-09-05 ENCOUNTER — Ambulatory Visit (INDEPENDENT_AMBULATORY_CARE_PROVIDER_SITE_OTHER): Payer: Medicare Other | Admitting: Family

## 2014-09-05 VITALS — BP 136/86 | HR 67 | Temp 98.3°F | Ht 61.0 in | Wt 164.0 lb

## 2014-09-05 DIAGNOSIS — M47817 Spondylosis without myelopathy or radiculopathy, lumbosacral region: Secondary | ICD-10-CM

## 2014-09-05 DIAGNOSIS — R1310 Dysphagia, unspecified: Secondary | ICD-10-CM

## 2014-09-05 DIAGNOSIS — F32A Depression, unspecified: Secondary | ICD-10-CM

## 2014-09-05 DIAGNOSIS — E785 Hyperlipidemia, unspecified: Secondary | ICD-10-CM | POA: Diagnosis not present

## 2014-09-05 DIAGNOSIS — I10 Essential (primary) hypertension: Secondary | ICD-10-CM

## 2014-09-05 DIAGNOSIS — G4700 Insomnia, unspecified: Secondary | ICD-10-CM

## 2014-09-05 DIAGNOSIS — K2100 Gastro-esophageal reflux disease with esophagitis, without bleeding: Secondary | ICD-10-CM

## 2014-09-05 DIAGNOSIS — M47812 Spondylosis without myelopathy or radiculopathy, cervical region: Secondary | ICD-10-CM

## 2014-09-05 DIAGNOSIS — B009 Herpesviral infection, unspecified: Secondary | ICD-10-CM | POA: Diagnosis not present

## 2014-09-05 DIAGNOSIS — J4530 Mild persistent asthma, uncomplicated: Secondary | ICD-10-CM

## 2014-09-05 DIAGNOSIS — F329 Major depressive disorder, single episode, unspecified: Secondary | ICD-10-CM

## 2014-09-05 DIAGNOSIS — F411 Generalized anxiety disorder: Secondary | ICD-10-CM | POA: Diagnosis not present

## 2014-09-05 DIAGNOSIS — J309 Allergic rhinitis, unspecified: Secondary | ICD-10-CM | POA: Diagnosis not present

## 2014-09-05 MED ORDER — HYDROCODONE-ACETAMINOPHEN 7.5-325 MG PO TABS: 1.0000 | ORAL_TABLET | Freq: Three times a day (TID) | ORAL | Status: DC

## 2014-09-05 MED ORDER — DIAZEPAM 5 MG PO TABS: ORAL_TABLET | ORAL | Status: DC

## 2014-09-05 MED ORDER — LORATADINE 10 MG PO CAPS: 10.0000 mg | ORAL_CAPSULE | Freq: Every day | ORAL | Status: DC

## 2014-09-05 NOTE — Progress Notes (Signed)
Subjective:    Patient ID: [name], female    DOB: 05/15/1955, 59 y.o.   MRN: [id number]  Hyperlipidemia This is a chronic problem. The current episode started more than 1 year ago. The problem is controlled. Recent lipid tests were reviewed and are normal. She has no history of diabetes or hypothyroidism. Pertinent negatives include no chest pain, leg pain, myalgias or shortness of breath. Current antihyperlipidemic treatment includes statins. The current treatment provides significant improvement of lipids. Risk factors for coronary artery disease include dyslipidemia, obesity, post-menopausal and family history.  Hypertension This is a chronic problem. The current episode started more than 1 year ago. The problem has been resolved since onset. The problem is controlled. Associated symptoms include anxiety and headaches. Pertinent negatives include no chest pain, palpitations, peripheral edema or shortness of breath. Risk factors for coronary artery disease include dyslipidemia, family history, post-menopausal state and sedentary lifestyle. Past treatments include diuretics. The current treatment provides moderate improvement. Hypertensive end-organ damage includes CAD/MI and heart failure. There is no history of kidney disease, CVA or a thyroid problem. Identifiable causes of hypertension include sleep apnea.  Anxiety Presents for follow-up visit. Onset was 1 to 6 months ago. The problem has been waxing and waning. Symptoms include depressed mood, excessive worry, insomnia and nervous/anxious behavior. Patient reports no chest pain, malaise, palpitations or shortness of breath. Symptoms occur occasionally. The symptoms are aggravated by family issues. The quality of sleep is fair.   Her past medical history is significant for anxiety/panic attacks and depression. Past treatments include SSRIs and non-SSRI antidepressants. The treatment provided significant relief. Compliance with prior  treatments has been good.  Gastrophageal Reflux She complains of dysphagia and heartburn. She reports no belching, no chest pain or no sore throat. This is a chronic problem. The current episode started more than 1 year ago. The problem occurs occasionally. The problem has been waxing and waning. The heartburn wakes her from sleep. The symptoms are aggravated by certain foods and lying down. Pertinent negatives include no fatigue. She has tried a PPI for the symptoms. The treatment provided significant relief.  Back Pain This is a chronic problem. The current episode started more than 1 year ago. The problem occurs intermittently. The problem has been waxing and waning since onset. The pain is present in the lumbar spine. The quality of the pain is described as aching. The pain is at a severity of 10/10. The pain is mild. Associated symptoms include headaches. Pertinent negatives include no chest pain or leg pain. She has tried analgesics and muscle relaxant for the symptoms. The treatment provided mild relief.      Review of Systems  Constitutional: Negative.  Negative for fatigue.  HENT: Negative for sore throat.   Eyes: Negative.   Respiratory: Negative.  Negative for shortness of breath.   Cardiovascular: Negative.  Negative for chest pain and palpitations.  Gastrointestinal: Positive for heartburn and dysphagia.  Endocrine: Negative.   Genitourinary: Negative.   Musculoskeletal: Positive for back pain. Negative for myalgias.  Neurological: Positive for headaches.  Hematological: Negative.   Psychiatric/Behavioral: The patient is nervous/anxious and has insomnia.   All other systems reviewed and are negative.      Objective:   Physical Exam  Constitutional: She is oriented to person, place, and time. She appears well-developed and well-nourished. No distress.  HENT:  Head: Normocephalic and atraumatic.  Right Ear: External ear normal.  Left Ear: External ear normal.  Nose: Nose  normal.  Mouth/Throat: Oropharynx is clear and moist.  Eyes: Pupils are equal, round, and reactive to light.  Neck: Normal range of motion. Neck supple. No thyromegaly present.  Cardiovascular: Normal rate, regular rhythm, normal heart sounds and intact distal pulses.   No murmur heard. Pulmonary/Chest: Effort normal and breath sounds normal. No respiratory distress. She has no wheezes.  Abdominal: Soft. Bowel sounds are normal. She exhibits no distension. There is no tenderness.  Musculoskeletal: Normal range of motion. She exhibits no edema or tenderness.  Neurological: She is alert and oriented to person, place, and time. She has normal reflexes. No cranial nerve deficit.  Skin: Skin is warm and dry.  Psychiatric: She has a normal mood and affect. Her behavior is normal. Judgment and thought content normal.  Vitals reviewed.     BP 136/86 mmHg  Pulse 67  Temp(Src) 98.3 F (36.8 C) (Oral)  Ht 5' 1"  (1.549 m)  Wt 164 lb (74.39 kg)  BMI 31.00 kg/m2     Assessment & Plan:  1. Essential hypertension - CMP14+EGFR  2. Asthma, mild persistent, uncomplicated - LTY75+PBAQ  3. Gastroesophageal reflux disease with esophagitis - CMP14+EGFR  4. Dysphagia - CMP14+EGFR  5. Hyperlipidemia with target LDL less than 100 - CMP14+EGFR - Lipid panel  6. Herpes simplex virus (HSV) infection - CMP14+EGFR  7. Anxiety state - CMP14+EGFR - diazepam (VALIUM) 5 MG tablet; TAKE 1 TABLET EVERY 12 HOURS AS NEEDED FOR ANXIETY.  Dispense: 60 tablet; Refill: 2  8. Depression - CMP14+EGFR  9. Insomnia  - CMP14+EGFR  10. Lumbosacral spondylosis without myelopathy - CMP14+EGFR - HYDROcodone-acetaminophen (NORCO) 7.5-325 MG per tablet; Take 1 tablet by mouth 3 (three) times daily.  Dispense: 45 tablet; Refill: 0  11. Cervical spondylosis without myelopathy - CMP14+EGFR - HYDROcodone-acetaminophen (NORCO) 7.5-325 MG per tablet; Take 1 tablet by mouth 3 (three) times daily.  Dispense: 45  tablet; Refill: 0  12. Allergic rhinitis, unspecified allergic rhinitis type - CMP14+EGFR - Loratadine 10 MG CAPS; Take 1 capsule (10 mg total) by mouth daily.  Dispense: 90 each; Refill: 3   Continue all meds Labs pending Health Maintenance reviewed Diet and exercise encouraged RTO 6 months   Evelina Dun, FNP

## 2014-09-05 NOTE — Patient Instructions (Signed)

## 2014-09-06 ENCOUNTER — Other Ambulatory Visit: Payer: Self-pay | Admitting: *Deleted

## 2014-09-06 ENCOUNTER — Telehealth: Payer: Self-pay | Admitting: Family

## 2014-09-06 DIAGNOSIS — J309 Allergic rhinitis, unspecified: Secondary | ICD-10-CM

## 2014-09-06 LAB — CMP14+EGFR
ALBUMIN: 4.4 g/dL (ref 3.5–5.5)
ALT: 10 IU/L (ref 0–32)
AST: 15 IU/L (ref 0–40)
Albumin/Globulin Ratio: 1.6 (ref 1.1–2.5)
Alkaline Phosphatase: 118 IU/L — ABNORMAL HIGH (ref 39–117)
BILIRUBIN TOTAL: 0.4 mg/dL (ref 0.0–1.2)
BUN/Creatinine Ratio: 14 (ref 9–23)
BUN: 11 mg/dL (ref 6–24)
CO2: 23 mmol/L (ref 18–29)
Calcium: 9.8 mg/dL (ref 8.7–10.2)
Chloride: 101 mmol/L (ref 97–108)
Creatinine, Ser: 0.8 mg/dL (ref 0.57–1.00)
GFR calc Af Amer: 95 mL/min/{1.73_m2} (ref >59)
GFR calc non Af Amer: 82 mL/min/{1.73_m2} (ref >59)
Globulin, Total: 2.7 g/dL (ref 1.5–4.5)
Glucose: 118 mg/dL — ABNORMAL HIGH (ref 65–99)
Potassium: 4.8 mmol/L (ref 3.5–5.2)
SODIUM: 141 mmol/L (ref 134–144)
Total Protein: 7.1 g/dL (ref 6.0–8.5)

## 2014-09-06 LAB — LIPID PANEL
Chol/HDL Ratio: 3.1 ratio units (ref 0.0–4.4)
Cholesterol, Total: 178 mg/dL (ref 100–199)
HDL: 57 mg/dL (ref >39)
LDL Calculated: 97 mg/dL (ref 0–99)
TRIGLYCERIDES: 118 mg/dL (ref 0–149)
VLDL Cholesterol Cal: 24 mg/dL (ref 5–40)

## 2014-09-06 MED ORDER — LORATADINE 10 MG PO CAPS: 10.0000 mg | ORAL_CAPSULE | Freq: Every day | ORAL | Status: DC

## 2014-09-06 NOTE — Telephone Encounter (Signed)
meds sent to pharmacy again. Called [facility] to verify they received

## 2014-09-13 ENCOUNTER — Telehealth: Payer: Self-pay | Admitting: Family

## 2014-09-14 ENCOUNTER — Other Ambulatory Visit: Payer: Self-pay

## 2014-09-14 NOTE — Telephone Encounter (Signed)
Last seen 09/05/14 [name]  This med came up BID but pharmacy's fax came over as once a day????

## 2014-09-15 MED ORDER — SOLIFENACIN SUCCINATE 5 MG PO TABS: 5.0000 mg | ORAL_TABLET | Freq: Two times a day (BID) | ORAL | Status: DC

## 2014-09-19 ENCOUNTER — Telehealth: Payer: Self-pay | Admitting: *Deleted

## 2014-09-19 ENCOUNTER — Other Ambulatory Visit: Payer: Self-pay | Admitting: Nurse Practitioner

## 2014-09-19 MED ORDER — BENZONATATE 100 MG PO CAPS: 100.0000 mg | ORAL_CAPSULE | Freq: Three times a day (TID) | ORAL | Status: DC | PRN

## 2014-09-19 MED ORDER — TOLTERODINE TARTRATE 2 MG PO TABS: 2.0000 mg | ORAL_TABLET | Freq: Two times a day (BID) | ORAL | Status: DC

## 2014-09-19 NOTE — Progress Notes (Signed)
Patient notified that rxs changed

## 2014-09-19 NOTE — Telephone Encounter (Signed)
Patient states that she is having problems with a bronchitis flare up and wants to know if you will call her in something for cough. She is using her nebulizer but sounds bad. Please advise

## 2014-09-23 DIAGNOSIS — G40909 Epilepsy, unspecified, not intractable, without status epilepticus: Secondary | ICD-10-CM | POA: Diagnosis not present

## 2014-09-23 DIAGNOSIS — I252 Old myocardial infarction: Secondary | ICD-10-CM | POA: Diagnosis not present

## 2014-09-23 DIAGNOSIS — Z955 Presence of coronary angioplasty implant and graft: Secondary | ICD-10-CM | POA: Diagnosis not present

## 2014-09-23 DIAGNOSIS — A6000 Herpesviral infection of urogenital system, unspecified: Secondary | ICD-10-CM | POA: Diagnosis not present

## 2014-09-23 DIAGNOSIS — J40 Bronchitis, not specified as acute or chronic: Secondary | ICD-10-CM | POA: Diagnosis not present

## 2014-09-23 DIAGNOSIS — I519 Heart disease, unspecified: Secondary | ICD-10-CM | POA: Diagnosis not present

## 2014-09-23 DIAGNOSIS — I10 Essential (primary) hypertension: Secondary | ICD-10-CM | POA: Diagnosis not present

## 2014-09-23 DIAGNOSIS — F319 Bipolar disorder, unspecified: Secondary | ICD-10-CM | POA: Diagnosis not present

## 2014-09-23 DIAGNOSIS — R0602 Shortness of breath: Secondary | ICD-10-CM | POA: Diagnosis not present

## 2014-09-23 DIAGNOSIS — Z79899 Other long term (current) drug therapy: Secondary | ICD-10-CM | POA: Diagnosis not present

## 2014-09-23 DIAGNOSIS — J209 Acute bronchitis, unspecified: Secondary | ICD-10-CM | POA: Diagnosis not present

## 2014-09-23 DIAGNOSIS — R05 Cough: Secondary | ICD-10-CM | POA: Diagnosis not present

## 2014-09-23 DIAGNOSIS — J45901 Unspecified asthma with (acute) exacerbation: Secondary | ICD-10-CM | POA: Diagnosis not present

## 2014-09-23 DIAGNOSIS — E780 Pure hypercholesterolemia: Secondary | ICD-10-CM | POA: Diagnosis not present

## 2014-09-25 ENCOUNTER — Other Ambulatory Visit: Payer: Self-pay

## 2014-09-27 ENCOUNTER — Telehealth: Payer: Self-pay | Admitting: Family

## 2014-09-27 DIAGNOSIS — M47817 Spondylosis without myelopathy or radiculopathy, lumbosacral region: Secondary | ICD-10-CM

## 2014-09-27 DIAGNOSIS — M47812 Spondylosis without myelopathy or radiculopathy, cervical region: Secondary | ICD-10-CM

## 2014-09-27 MED ORDER — HYDROCODONE-ACETAMINOPHEN 7.5-325 MG PO TABS: 1.0000 | ORAL_TABLET | Freq: Three times a day (TID) | ORAL | Status: DC

## 2014-09-27 NOTE — Telephone Encounter (Signed)
Left message Rx is ready for pickup

## 2014-09-27 NOTE — Telephone Encounter (Signed)
RX ready for pick up 

## 2014-09-28 ENCOUNTER — Ambulatory Visit (INDEPENDENT_AMBULATORY_CARE_PROVIDER_SITE_OTHER): Payer: Medicare Other

## 2014-09-28 ENCOUNTER — Encounter: Payer: Self-pay | Admitting: Physician Assistant

## 2014-09-28 ENCOUNTER — Telehealth: Payer: Self-pay | Admitting: Family

## 2014-09-28 ENCOUNTER — Ambulatory Visit (INDEPENDENT_AMBULATORY_CARE_PROVIDER_SITE_OTHER): Payer: Medicare Other | Admitting: Physician Assistant

## 2014-09-28 VITALS — BP 126/78 | HR 77 | Temp 97.2°F | Ht 61.0 in | Wt 169.0 lb

## 2014-09-28 DIAGNOSIS — R062 Wheezing: Secondary | ICD-10-CM | POA: Diagnosis not present

## 2014-09-28 DIAGNOSIS — R059 Cough, unspecified: Secondary | ICD-10-CM

## 2014-09-28 DIAGNOSIS — J209 Acute bronchitis, unspecified: Secondary | ICD-10-CM | POA: Diagnosis not present

## 2014-09-28 DIAGNOSIS — R05 Cough: Secondary | ICD-10-CM

## 2014-09-28 MED ORDER — HYDROCODONE-HOMATROPINE 5-1.5 MG/5ML PO SYRP
5.0000 mL | ORAL_SOLUTION | Freq: Three times a day (TID) | ORAL | Status: DC | PRN
Start: 2014-09-28 — End: 2014-11-10

## 2014-09-28 MED ORDER — PREDNISONE 10 MG (21) PO TBPK: 10.0000 mg | ORAL_TABLET | Freq: Every day | ORAL | Status: DC

## 2014-09-28 MED ORDER — LEVOFLOXACIN 750 MG PO TABS: 750.0000 mg | ORAL_TABLET | Freq: Every day | ORAL | Status: DC

## 2014-09-28 NOTE — Progress Notes (Signed)
   Subjective:    Patient ID: [name], female    DOB: 05/17/1955, 59 y.o.   MRN: [id number]  HPI 59 y/o female with COPD presents for wheezing x 3 weeks.She went to [facility] ED 7 days ago and was given steroid injection, Azithromycin x 5 days, and Hydromet cough syrup. She states that she is feeling a little better but still has significant wheezing.  Nonsmoker   Review of Systems  Constitutional: Positive for diaphoresis.  HENT: Positive for postnasal drip and sore throat.   Respiratory: Positive for cough (productive, worse in the am ), shortness of breath and wheezing.   Cardiovascular: Negative.   All other systems reviewed and are negative.      Objective:   Physical Exam  Constitutional: She is oriented to person, place, and time. She appears well-developed and well-nourished.  Pulmonary/Chest: No respiratory distress. She has wheezes (end inspiratory and expiratory wheeze bilaterally . More prominant on posterior BS). She has no rales. She exhibits no tenderness.  O2 93% on room air Increased coughing with deep inspiration during exam  Neurological: She is alert and oriented to person, place, and time.  Psychiatric: She has a normal mood and affect. Her behavior is normal. Judgment and thought content normal.  Nursing note and vitals reviewed.         Assessment & Plan:  1. Cough  - DG Chest 2 View; Future - predniSONE (STERAPRED UNI-PAK 21 TAB) 10 MG (21) TBPK tablet; Take 1 tablet (10 mg total) by mouth daily. 6 on day 1, 5 on day 2, 4 on day 3, 3 on day 4, 2 on day 5, 1 on day 6  Dispense: 21 tablet; Refill: 0 - HYDROcodone-homatropine (HYCODAN) 5-1.5 MG/5ML syrup; Take 5 mLs by mouth every 8 (eight) hours as needed for cough.  Dispense: 120 mL; Refill: 0 - Patient states she can take this although it comes up as an allergy  2. Wheeze  - predniSONE (STERAPRED UNI-PAK 21 TAB) 10 MG (21) TBPK tablet; Take 1 tablet (10 mg total) by mouth daily. 6 on day 1, 5  on day 2, 4 on day 3, 3 on day 4, 2 on day 5, 1 on day 6  Dispense: 21 tablet; Refill: 0  3. Acute bronchitis, unspecified organism  - levofloxacin (LEVAQUIN) 750 MG tablet; Take 1 tablet (750 mg total) by mouth daily.  Dispense: 7 tablet; Refill: 0 - predniSONE (STERAPRED UNI-PAK 21 TAB) 10 MG (21) TBPK tablet; Take 1 tablet (10 mg total) by mouth daily. 6 on day 1, 5 on day 2, 4 on day 3, 3 on day 4, 2 on day 5, 1 on day 6  Dispense: 21 tablet; Refill: 0   Over the counter plain musinex Cool mist humidifier If symptoms worsen over the weekend, go to the ER  RTO 1 week   [name] A. Chauncey Reading PA-C

## 2014-09-28 NOTE — Telephone Encounter (Signed)
Appt scheduled for this afternoon. Patient aware.

## 2014-09-28 NOTE — Patient Instructions (Signed)
Add over the counter plain musinex as directed .

## 2014-10-06 ENCOUNTER — Ambulatory Visit (INDEPENDENT_AMBULATORY_CARE_PROVIDER_SITE_OTHER): Payer: Medicare Other

## 2014-10-06 ENCOUNTER — Ambulatory Visit (INDEPENDENT_AMBULATORY_CARE_PROVIDER_SITE_OTHER): Payer: Medicare Other | Admitting: Physician Assistant

## 2014-10-06 ENCOUNTER — Encounter: Payer: Self-pay | Admitting: Physician Assistant

## 2014-10-06 VITALS — BP 120/75 | HR 72 | Temp 97.7°F | Ht 61.0 in | Wt 165.0 lb

## 2014-10-06 DIAGNOSIS — J189 Pneumonia, unspecified organism: Secondary | ICD-10-CM | POA: Diagnosis not present

## 2014-10-06 MED ORDER — LEVOFLOXACIN 500 MG PO TABS: 500.0000 mg | ORAL_TABLET | Freq: Every day | ORAL | Status: DC

## 2014-10-06 NOTE — Progress Notes (Signed)
   Subjective:    Patient ID: [name], female    DOB: [birth year], 59 y.o.   MRN: [id number]  HPI 59 y/o female presents for 1 week follow up of COPD exacerbation. She states that she is doing much better but still has a cough and pleuritic pain with deep inhalation. She has finished Levaquin, prednisone. She is still using nebulizer twice daily. She did not take the musinex as directed.     Review of Systems  Constitutional: Negative.   HENT: Negative for congestion.   Respiratory: Positive for cough (productive at times ) and chest tightness. Negative for shortness of breath.   Cardiovascular: Negative.   All other systems reviewed and are negative.      Objective:   Physical Exam  Constitutional: She is oriented to person, place, and time. She appears well-developed and well-nourished.  HENT:  Head: Normocephalic and atraumatic.  Cardiovascular: Normal rate, regular rhythm, normal heart sounds and intact distal pulses.  Exam reveals no gallop and no friction rub.   No murmur heard. Pulmonary/Chest: Effort normal. No respiratory distress. She has wheezes (expiratory wheezes LLL). She has no rales. She exhibits no tenderness.  Neurological: She is alert and oriented to person, place, and time.  Psychiatric: She has a normal mood and affect. Her behavior is normal. Judgment and thought content normal.  Nursing note and vitals reviewed.         Assessment & Plan:  1. Pneumonia, organism unspecified - Plain musinex - Continue to use inhaler or nebulzier q 6 hours x 1 week - DG Chest 2 View - levofloxacin (LEVAQUIN) 500 MG tablet; Take 1 tablet (500 mg total) by mouth daily.  Dispense: 7 tablet; Refill: 0   RTO prn   Antia Rahal A. Chauncey Reading PA-C

## 2014-10-11 DIAGNOSIS — F333 Major depressive disorder, recurrent, severe with psychotic symptoms: Secondary | ICD-10-CM | POA: Diagnosis not present

## 2014-10-17 ENCOUNTER — Encounter: Payer: Self-pay | Admitting: Nurse Practitioner

## 2014-10-17 ENCOUNTER — Ambulatory Visit (INDEPENDENT_AMBULATORY_CARE_PROVIDER_SITE_OTHER): Payer: Medicare Other

## 2014-10-17 ENCOUNTER — Ambulatory Visit (INDEPENDENT_AMBULATORY_CARE_PROVIDER_SITE_OTHER): Payer: Medicare Other | Admitting: Nurse Practitioner

## 2014-10-17 VITALS — BP 152/95 | HR 114 | Temp 97.5°F | Ht 61.0 in | Wt 165.0 lb

## 2014-10-17 DIAGNOSIS — M47817 Spondylosis without myelopathy or radiculopathy, lumbosacral region: Secondary | ICD-10-CM | POA: Diagnosis not present

## 2014-10-17 DIAGNOSIS — M25561 Pain in right knee: Secondary | ICD-10-CM

## 2014-10-17 DIAGNOSIS — M47812 Spondylosis without myelopathy or radiculopathy, cervical region: Secondary | ICD-10-CM | POA: Diagnosis not present

## 2014-10-17 MED ORDER — HYDROCODONE-ACETAMINOPHEN 7.5-325 MG PO TABS: 1.0000 | ORAL_TABLET | Freq: Three times a day (TID) | ORAL | Status: DC

## 2014-10-17 MED ORDER — METHYLPREDNISOLONE ACETATE 80 MG/ML IJ SUSP
80.0000 mg | Freq: Once | INTRAMUSCULAR | Status: AC
  Administered 2014-10-17: 80 mg via INTRAMUSCULAR

## 2014-10-17 MED ORDER — PREDNISONE 20 MG PO TABS: ORAL_TABLET | ORAL | Status: DC

## 2014-10-17 NOTE — Patient Instructions (Signed)
RICE: Routine Care for Injuries Rest, Ice, Compression, and Elevation (RICE) are often used to care for injuries. HOME CARE  Rest your injury.  Put ice on the injury.  Put ice in a plastic bag.  Place a towel between your skin and the bag.  Leave the ice on for 15-20 minutes, 03-04 times a day. Do this for as long as told by your doctor.  Apply pressure (compression) with an elastic bandage. Remove and reapply the bandage every 3 to 4 hours. Do not wrap the bandage too tight. Wrap the bandage looser if the fingers or toes are puffy (swollen), blue, cold, painful, or lose feeling (numb).  Raise (elevate) your injury. Raise your injury above the heart if you can. GET HELP RIGHT AWAY IF:  You have lasting pain or puffiness.  Your injury is red, weak, or loses feeling.  Your problems get worse, not better, after several days. MAKE SURE YOU:  Understand these instructions.  Will watch your condition.  Will get help right away if you are not doing well or get worse. Document Released: 09/03/2007 Document Revised: 06/09/2011 Document Reviewed: 08/16/2010 ExitCare Patient Information 2015 [employer]. This information is not intended to replace advice given to you by your health care provider. Make sure you discuss any questions you have with your health care provider.  

## 2014-10-17 NOTE — Progress Notes (Signed)
   Subjective:    Patient ID: [name], female    DOB: 06-20-1955, 59 y.o.   MRN: [id number]  HPI Patient in c/o right knee pain that started last weekend- she is a full blood [place] and she went to a Pow-Wow and was doing a lot of dancing- the next morning her knee was hurting- hurts to get up and down. Slight swelling- pain pills help with pain.    Review of Systems  Constitutional: Negative.   HENT: Negative.   Respiratory: Negative.   Cardiovascular: Negative.   Genitourinary: Negative.   Neurological: Negative.   Psychiatric/Behavioral: Negative.   All other systems reviewed and are negative.      Objective:   Physical Exam  Constitutional: She is oriented to person, place, and time. She appears well-developed and well-nourished.  Cardiovascular: Normal rate, regular rhythm and normal heart sounds.   Pulmonary/Chest: Effort normal and breath sounds normal.  Musculoskeletal:  FROM of right knee with pain on flexion and extension- mild effusion No patella tenderness  Neurological: She is alert and oriented to person, place, and time.  Skin: Skin is warm and dry.  Psychiatric: She has a normal mood and affect. Her behavior is normal. Judgment and thought content normal.    BP 152/95 mmHg  Pulse 114  Temp(Src) 97.5 F (36.4 C) (Oral)  Ht 5\' 1"  (1.549 m)  Wt 165 lb (74.844 kg)  BMI 31.19 kg/m2  Right knee xray- normal-Preliminary reading by , FNP  [facility]      Assessment & Plan:   1. Right knee pain    Meds ordered this encounter  Medications  . methylPREDNISolone acetate (DEPO-MEDROL) injection 80 mg    Sig:   . predniSONE (DELTASONE) 20 MG tablet    Sig: 2 po at sametime daily for 5 days- start tomorrow    Dispense:  10 tablet    Refill:  0    Order Specific Question:  Supervising Provider    Answer:  [facility] [[id number]]  . HYDROcodone-acetaminophen (NORCO) 7.5-325 MG per tablet    Sig: Take 1 tablet by mouth 3 (three) times daily.   Dispense:  45 tablet    Refill:  0    Order Specific Question:  Supervising Provider    Answer:  07-29-1976 [[id number]]   Refilled norco 10 days early because she has been taking QID since knee hurting- told not to do anymore. Rest knee Ice BID Ace wrap Elevate leg when sitting  [name] Ernestina Penna, FNP

## 2014-11-10 ENCOUNTER — Encounter: Payer: Self-pay | Admitting: Nurse Practitioner

## 2014-11-10 ENCOUNTER — Ambulatory Visit (INDEPENDENT_AMBULATORY_CARE_PROVIDER_SITE_OTHER): Payer: Medicare Other | Admitting: Nurse Practitioner

## 2014-11-10 VITALS — BP 145/76 | HR 99 | Temp 97.3°F | Ht 61.0 in | Wt 158.0 lb

## 2014-11-10 DIAGNOSIS — M810 Age-related osteoporosis without current pathological fracture: Secondary | ICD-10-CM

## 2014-11-10 DIAGNOSIS — F411 Generalized anxiety disorder: Secondary | ICD-10-CM

## 2014-11-10 DIAGNOSIS — K2100 Gastro-esophageal reflux disease with esophagitis, without bleeding: Secondary | ICD-10-CM

## 2014-11-10 DIAGNOSIS — F329 Major depressive disorder, single episode, unspecified: Secondary | ICD-10-CM

## 2014-11-10 DIAGNOSIS — M791 Myalgia: Secondary | ICD-10-CM

## 2014-11-10 DIAGNOSIS — I10 Essential (primary) hypertension: Secondary | ICD-10-CM | POA: Diagnosis not present

## 2014-11-10 DIAGNOSIS — M47817 Spondylosis without myelopathy or radiculopathy, lumbosacral region: Secondary | ICD-10-CM | POA: Diagnosis not present

## 2014-11-10 DIAGNOSIS — E785 Hyperlipidemia, unspecified: Secondary | ICD-10-CM | POA: Diagnosis not present

## 2014-11-10 DIAGNOSIS — M47812 Spondylosis without myelopathy or radiculopathy, cervical region: Secondary | ICD-10-CM

## 2014-11-10 DIAGNOSIS — M25561 Pain in right knee: Secondary | ICD-10-CM

## 2014-11-10 DIAGNOSIS — M609 Myositis, unspecified: Secondary | ICD-10-CM

## 2014-11-10 DIAGNOSIS — F32A Depression, unspecified: Secondary | ICD-10-CM

## 2014-11-10 DIAGNOSIS — IMO0001 Reserved for inherently not codable concepts without codable children: Secondary | ICD-10-CM

## 2014-11-10 MED ORDER — BUTALBITAL-APAP-CAFFEINE 50-325-40 MG PO TABS: ORAL_TABLET | ORAL | Status: DC

## 2014-11-10 MED ORDER — FUROSEMIDE 40 MG PO TABS: ORAL_TABLET | ORAL | Status: DC

## 2014-11-10 MED ORDER — CRESTOR 10 MG PO TABS: 10.0000 mg | ORAL_TABLET | Freq: Every day | ORAL | Status: DC

## 2014-11-10 MED ORDER — BUPIVACAINE HCL 0.25 % IJ SOLN
1.0000 mL | Freq: Once | INTRAMUSCULAR | Status: AC
  Administered 2014-11-10: 1 mL

## 2014-11-10 MED ORDER — ESCITALOPRAM OXALATE 20 MG PO TABS: ORAL_TABLET | ORAL | Status: DC

## 2014-11-10 MED ORDER — LEVETIRACETAM 500 MG PO TABS: ORAL_TABLET | ORAL | Status: DC

## 2014-11-10 MED ORDER — DIAZEPAM 5 MG PO TABS: ORAL_TABLET | ORAL | Status: DC

## 2014-11-10 MED ORDER — HYDROCODONE-ACETAMINOPHEN 7.5-325 MG PO TABS: 1.0000 | ORAL_TABLET | Freq: Three times a day (TID) | ORAL | Status: DC

## 2014-11-10 MED ORDER — DEXLANSOPRAZOLE 60 MG PO CPDR: DELAYED_RELEASE_CAPSULE | ORAL | Status: DC

## 2014-11-10 MED ORDER — METHYLPREDNISOLONE ACETATE 40 MG/ML IJ SUSP
40.0000 mg | Freq: Once | INTRAMUSCULAR | Status: AC
  Administered 2014-11-10: 40 mg via INTRA_ARTICULAR

## 2014-11-10 MED ORDER — RANITIDINE HCL 150 MG PO TABS: 150.0000 mg | ORAL_TABLET | Freq: Every day | ORAL | Status: DC

## 2014-11-10 NOTE — Patient Instructions (Signed)
Joint Injection  Care After  Refer to this sheet in the next few days. These instructions provide you with information on caring for yourself after you have had a joint injection. Your caregiver also may give you more specific instructions. Your treatment has been planned according to current medical practices, but problems sometimes occur. Call your caregiver if you have any problems or questions after your procedure.  After any type of joint injection, it is not uncommon to experience:  · Soreness, swelling, or bruising around the injection site.  · Mild numbness, tingling, or weakness around the injection site caused by the numbing medicine used before or with the injection.  It also is possible to experience the following effects associated with the specific agent after injection:  · Iodine-based contrast agents:  ¨ Allergic reaction (itching, hives, widespread redness, and swelling beyond the injection site).  · Corticosteroids (These effects are rare.):  ¨ Allergic reaction.  ¨ Increased blood sugar levels (If you have diabetes and you notice that your blood sugar levels have increased, notify your caregiver).  ¨ Increased blood pressure levels.  ¨ Mood swings.  · Hyaluronic acid in the use of viscosupplementation.  ¨ Temporary heat or redness.  ¨ Temporary rash and itching.  ¨ Increased fluid accumulation in the injected joint.  These effects all should resolve within a day after your procedure.   HOME CARE INSTRUCTIONS  · Limit yourself to light activity the day of your procedure. Avoid lifting heavy objects, bending, stooping, or twisting.  · Take prescription or over-the-counter pain medication as directed by your caregiver.  · You may apply ice to your injection site to reduce pain and swelling the day of your procedure. Ice may be applied 03-04 times:  ¨ Put ice in a plastic bag.  ¨ Place a towel between your skin and the bag.  ¨ Leave the ice on for no longer than 15-20 minutes each time.  SEEK  IMMEDIATE MEDICAL CARE IF:   · Pain and swelling get worse rather than better or extend beyond the injection site.  · Numbness does not go away.  · Blood or fluid continues to leak from the injection site.  · You have chest pain.  · You have swelling of your face or tongue.  · You have trouble breathing or you become dizzy.  · You develop a fever, chills, or severe tenderness at the injection site that last longer than 1 day.  MAKE SURE YOU:  · Understand these instructions.  · Watch your condition.  · Get help right away if you are not doing well or if you get worse.  Document Released: 11/28/2010 Document Revised: 06/09/2011 Document Reviewed: 11/28/2010  ExitCare® Patient Information ©2015 [employer]. This information is not intended to replace advice given to you by your health care provider. Make sure you discuss any questions you have with your health care provider.

## 2014-11-10 NOTE — Progress Notes (Signed)
Subjective:    Patient ID: [name], female    DOB: [birth year], 59 y.o.   MRN: [id number]  HPI Patient in today for follow up of her chronic medical problems . The list is very extensive. She says that this will be the last time we see her because she is moving to [place] the first of September. She is doing quite well- Her only complaint today is knee pain that has been going on for several months. Otherwise she is doing good.  Patient Active Problem List   Diagnosis Date Noted  . Insomnia 09/05/2014  . Allergic rhinitis 09/05/2014  . Cervical spondylosis without myelopathy 12/19/2013    Class: Chronic  . Neural foraminal stenosis of cervical spine 12/19/2013  . Preop cardiovascular exam 11/24/2013  . Spinal stenosis in cervical region 09/19/2013  . Cervical radiculitis 09/19/2013  . Dysphagia 08/05/2013  . Lumbosacral spondylosis without myelopathy 11/16/2012  . Postlaminectomy syndrome, lumbar region 11/16/2012  . Sinus tachycardia 10/22/2011  . Chest pain, unspecified 09/30/2011  . Herpes simplex virus (HSV) infection 10/31/2008  . Hyperlipidemia with target LDL less than 100 10/31/2008  . Anxiety state 10/31/2008  . Depression 10/31/2008  . Essential hypertension 10/31/2008  . Asthma 10/31/2008  . GERD 10/31/2008  . SPINAL STENOSIS OF LUMBAR REGION 10/31/2008  . Myalgia and myositis 10/31/2008  . Osteoporosis 10/31/2008  . SPONDYLOLISTHESIS 10/31/2008  . SYNCOPE 10/31/2008  . Sleep apnea 10/31/2008  . HEAD TRAUMA, CLOSED 10/31/2008   Outpatient Encounter Prescriptions as of 11/10/2014  Medication Sig  . albuterol (PROVENTIL) (2.5 MG/3ML) 0.083% nebulizer solution Take 3 mLs (2.5 mg total) by nebulization every 6 (six) hours as needed for wheezing or shortness of breath.  . baclofen (LIORESAL) 10 MG tablet Take 1 tablet (10 mg total) by mouth 3 (three) times daily.  . busPIRone (BUSPAR) 10 MG tablet Take 10 mg by mouth 2 (two) times daily.  .  butalbital-acetaminophen-caffeine (FIORICET, ESGIC) 50-325-40 MG per tablet TAKE 1 TABLET TWICE DAILY AS NEEDED FOR TENSION HEADACHE.  [name] CRESTOR 10 MG tablet Take 1 tablet by mouth daily.  [name] DEXILANT 60 MG capsule TAKE 1 CAPSULE BY MOUTH ONCE A DAY.  . diazepam (VALIUM) 5 MG tablet TAKE 1 TABLET EVERY 12 HOURS AS NEEDED FOR ANXIETY.  . DULoxetine (CYMBALTA) 60 MG capsule Take 1 capsule (60 mg total) by mouth at bedtime.  [name] escitalopram (LEXAPRO) 20 MG tablet TAKE (1) TABLET BY MOUTH ONCE DAILY.  . furosemide (LASIX) 40 MG tablet Take 1 tablet twice daily  . HYDROcodone-acetaminophen (NORCO) 7.5-325 MG per tablet Take 1 tablet by mouth 3 (three) times daily.  [name] levETIRAcetam (KEPPRA) 500 MG tablet TAKE (1) TABLET TWICE DAILY.  [name] Loratadine 10 MG CAPS Take 1 capsule (10 mg total) by mouth daily.  . midodrine (PROAMATINE) 2.5 MG tablet Take 1 tablet (2.5 mg total) by mouth 3 (three) times daily with meals.  . nitroGLYCERIN (NITROLINGUAL) 0.4 MG/SPRAY spray USE (1) SPRAY UNDER TONGUE AS NEEDED FOR CHEST PAIN.  [name] PROAIR HFA 108 (90 BASE) MCG/ACT inhaler INHALE 2 PUFFS EVERY 6 HOURS AS NEEDED.  . ranitidine (ZANTAC) 150 MG tablet Take 1 tablet (150 mg total) by mouth at bedtime.  . tolterodine (DETROL) 2 MG tablet Take 1 tablet (2 mg total) by mouth 2 (two) times daily.  . traZODone (DESYREL) 150 MG tablet Take 1 tablet by mouth daily.  . valACYclovir (VALTREX) 500 MG tablet TAKE (1) TABLET TWICE DAILY.  . [DISCONTINUED] HYDROcodone-homatropine (HYCODAN) 5-1.5  MG/5ML syrup Take 5 mLs by mouth every 8 (eight) hours as needed for cough.  . [DISCONTINUED] predniSONE (DELTASONE) 20 MG tablet 2 po at sametime daily for 5 days- start tomorrow   No facility-administered encounter medications on file as of 11/10/2014.       Review of Systems  Constitutional: Negative.   HENT: Negative.   Respiratory: Negative.   Cardiovascular: Negative.   Genitourinary: Negative.   Neurological: Negative.     Psychiatric/Behavioral: Negative.   All other systems reviewed and are negative.      Objective:   Physical Exam  Constitutional: She is oriented to person, place, and time. She appears well-developed and well-nourished.  HENT:  Nose: Nose normal.  Mouth/Throat: Oropharynx is clear and moist.  Eyes: EOM are normal.  Neck: Trachea normal, normal range of motion and full passive range of motion without pain. Neck supple. No JVD present. Carotid bruit is not present. No thyromegaly present.  Cardiovascular: Normal rate, regular rhythm, normal heart sounds and intact distal pulses.  Exam reveals no gallop and no friction rub.   No murmur heard. Pulmonary/Chest: Effort normal and breath sounds normal.  Abdominal: Soft. Bowel sounds are normal. She exhibits no distension and no mass. There is no tenderness.  Musculoskeletal: Normal range of motion.  FROM of right knee with crepitus on flexion and extension- slight knee effusion- all ligaments intact  Lymphadenopathy:    She has no cervical adenopathy.  Neurological: She is alert and oriented to person, place, and time. She has normal reflexes.  Skin: Skin is warm and dry.  Psychiatric: She has a normal mood and affect. Her behavior is normal. Judgment and thought content normal.   BP 145/76 mmHg  Pulse 99  Temp(Src) 97.3 F (36.3 C) (Oral)  Ht _0  (1.549 m)  Wt 158 lb (71.668 kg)  BMI 29.87 kg/m2  Procedure:  Knee cleaned with betadine after spraying injection area with ethyl chloride  Marcaine and depomedrol injection with 20g 1 1/2 inch needle  pateint tolerated well        Assessment & Plan:   1. Essential hypertension   2. Gastroesophageal reflux disease with esophagitis   3. Osteoporosis   4. Hyperlipidemia with target LDL less than 100   5. Depression   6. Anxiety state   7. Myalgia and myositis   8. Right knee pain   9. Lumbosacral spondylosis without myelopathy   10. Cervical spondylosis without myelopathy     Orders Placed This Encounter  Procedures  . CMP14+EGFR  . Lipid panel   Meds ordered this encounter  Medications  . bupivacaine (MARCAINE) 0.25 % (with pres) injection 1 mL    Sig:   . methylPREDNISolone acetate (DEPO-MEDROL) injection 40 mg    Sig:   . butalbital-acetaminophen-caffeine (FIORICET, ESGIC) 50-325-40 MG per tablet    Sig: TAKE 1 TABLET TWICE DAILY AS NEEDED FOR TENSION HEADACHE.    Dispense:  60 tablet    Refill:  1    Order Specific Question:  Supervising Provider    Answer:  Chipper Herb [[id number]]  . levETIRAcetam (KEPPRA) 500 MG tablet    Sig: TAKE (1) TABLET TWICE DAILY.    Dispense:  60 tablet    Refill:  5    Please schedule regular Office Visit.  Thank you.    Order Specific Question:  Supervising Provider    Answer:  Chipper Herb [[id number]]  . diazepam (VALIUM) 5 MG tablet    Sig: TAKE 1  TABLET EVERY 12 HOURS AS NEEDED FOR ANXIETY.    Dispense:  60 tablet    Refill:  2    Order Specific Question:  Supervising Provider    Answer:  Chipper Herb [[id number]]  . ranitidine (ZANTAC) 150 MG tablet    Sig: Take 1 tablet (150 mg total) by mouth at bedtime.    Dispense:  30 tablet    Refill:  5    Order Specific Question:  Supervising Provider    Answer:  Chipper Herb [[id number]]  . CRESTOR 10 MG tablet    Sig: Take 1 tablet (10 mg total) by mouth daily.    Dispense:  30 tablet    Refill:  5    Order Specific Question:  Supervising Provider    Answer:  Chipper Herb [[id number]]  . furosemide (LASIX) 40 MG tablet    Sig: Take 1 tablet twice daily    Dispense:  60 tablet    Refill:  3    Order Specific Question:  Supervising Provider    Answer:  Chipper Herb [[id number]]  . HYDROcodone-acetaminophen (NORCO) 7.5-325 MG per tablet    Sig: Take 1 tablet by mouth 3 (three) times daily.    Dispense:  45 tablet    Refill:  0    Do not fill till 11/14/14    Order Specific Question:  Supervising Provider    Answer:  Chipper Herb [[id number]]  . dexlansoprazole (DEXILANT)  60 MG capsule    Sig: TAKE 1 CAPSULE BY MOUTH ONCE A DAY.    Dispense:  30 capsule    Refill:  6    Order Specific Question:  Supervising Provider    Answer:  Chipper Herb [[id number]]  . escitalopram (LEXAPRO) 20 MG tablet    Sig: TAKE (1) TABLET BY MOUTH ONCE DAILY.    Dispense:  30 tablet    Refill:  2    Order Specific Question:  Supervising Provider    Answer:  Chipper Herb [[id number]]   Elevate leg- ice BID Continue all meds Labs pending Need to find doctor in [place], [place]

## 2014-11-11 LAB — CMP14+EGFR
A/G RATIO: 1.6 (ref 1.1–2.5)
ALK PHOS: 106 IU/L (ref 39–117)
ALT: 18 IU/L (ref 0–32)
AST: 18 IU/L (ref 0–40)
Albumin: 4.5 g/dL (ref 3.5–5.5)
BILIRUBIN TOTAL: 0.2 mg/dL (ref 0.0–1.2)
BUN / CREAT RATIO: 14 (ref 9–23)
BUN: 13 mg/dL (ref 6–24)
CALCIUM: 9.6 mg/dL (ref 8.7–10.2)
CO2: 24 mmol/L (ref 18–29)
CREATININE: 0.9 mg/dL (ref 0.57–1.00)
Chloride: 103 mmol/L (ref 97–108)
GFR calc Af Amer: 82 mL/min/{1.73_m2} (ref >59)
GFR calc non Af Amer: 71 mL/min/{1.73_m2} (ref >59)
GLOBULIN, TOTAL: 2.9 g/dL (ref 1.5–4.5)
GLUCOSE: 115 mg/dL — AB (ref 65–99)
POTASSIUM: 4.4 mmol/L (ref 3.5–5.2)
Sodium: 144 mmol/L (ref 134–144)
TOTAL PROTEIN: 7.4 g/dL (ref 6.0–8.5)

## 2014-11-11 LAB — LIPID PANEL
Chol/HDL Ratio: 3 ratio units (ref 0.0–4.4)
Cholesterol, Total: 200 mg/dL — ABNORMAL HIGH (ref 100–199)
HDL: 67 mg/dL (ref >39)
LDL Calculated: 110 mg/dL — ABNORMAL HIGH (ref 0–99)
TRIGLYCERIDES: 116 mg/dL (ref 0–149)
VLDL Cholesterol Cal: 23 mg/dL (ref 5–40)

## 2014-11-14 ENCOUNTER — Other Ambulatory Visit: Payer: Self-pay | Admitting: Nurse Practitioner

## 2014-11-14 ENCOUNTER — Telehealth: Payer: Self-pay | Admitting: Internal Medicine

## 2014-11-14 MED ORDER — FLUDROCORTISONE ACETATE 0.1 MG PO TABS: 0.1000 mg | ORAL_TABLET | Freq: Every day | ORAL | Status: DC

## 2014-11-14 NOTE — Telephone Encounter (Signed)
SPOKE WITH   PT   HAD A SYNCOPAL EPISODE  YESTERDAY  AND  SUFFERED SCRAPES TO LEGS  WHEN PASSED OUT . PRIOR  TO EPISODE  B/P WAS ELEVATED  AS WELL AS  HEART  RATE  "FELT HEART WAS GOING TO COME OUT OF CHEST " HAS BEEN ABOUT A  YEAR SINCE  HAD  LAST EPISODE.PT  HAS   LOOP RECORDER   BUT  BATTERY  HAS  BEEN  DEPLETED SINCE  2013 HAS  HISTORY  OF  V TACH PER PT  FEELS FINE  TODAY    B/P  116/78 AND HEART RATE  73 DISCUSSED WITH DR Anne Fu APPEARS MEDS  HAD  BEEN RECENTLY  CHANGED   PER  DR  SKAINS RESTART  FLORINEF 0.1 MG  EVERY DAY   AND  FOLLOW UP  WITH  DR Ladona Ridgel  APPT  MADE  WITH  DR   Marguarite Arbour FOR  11-29-14 AT 34:30 PM  PT  AWARE OF RECOMMENDATIONS   AND  INSTRUCTED  IF  HAS  MORE  EPISODES  OR  CHANGE  IN S/S  TO CALL [facility] OFFICE   AND  FOLLOW UP  WITH DR  BRANCH PT  VERBALIZED UNDERSTANDING .Zack Seal

## 2014-11-14 NOTE — Telephone Encounter (Signed)
New message  Pt c/o of Chest Pain: STAT if CP now or developed within 24 hours  1. Are you having CP right now? NO   2. Are you experiencing any other symptoms (ex. SOB, nausea, vomiting, sweating)? Not really   3. How long have you been experiencing CP? 72 Hours   4. Is your CP continuous or coming and going? Continuous  5. Have you taken Nitroglycerin? No   Comments: Pt called states that she was in the pool and se got out to go inside. She bent down to fix her bathing suit and she "Went Down" . Will complete Dot Phrase for Syncope.   Pt c/o Syncope: STAT if syncope occurred within 30 minutes and pt complains of lightheadedness High Priority if episode of passing out, completely, today or in last 24 hours   1. Did you pass out today? No  2. When is the last time you passed out? Yesterday around 3;30pm   3. Has this occurred multiple times? Yes it occurred twice same day   4. Did you have any symptoms prior to passing out? Yes it felt like her chest was going to Explode, Bp was high. Heart rate was high  Readings came after. They did not call 911. 156/103 heart rate was 103. No telling how High it was during the episode.   ?

## 2014-11-16 ENCOUNTER — Telehealth: Payer: Self-pay | Admitting: Nurse Practitioner

## 2014-11-16 MED ORDER — CIPROFLOXACIN HCL 500 MG PO TABS: 500.0000 mg | ORAL_TABLET | Freq: Two times a day (BID) | ORAL | Status: DC

## 2014-11-16 NOTE — Telephone Encounter (Signed)
cipro sent to pharmacy for UTI

## 2014-11-17 ENCOUNTER — Other Ambulatory Visit: Payer: Self-pay | Admitting: Family

## 2014-11-17 ENCOUNTER — Other Ambulatory Visit: Payer: Self-pay | Admitting: Nurse Practitioner

## 2014-11-17 ENCOUNTER — Other Ambulatory Visit: Payer: Self-pay | Admitting: Cardiology

## 2014-11-17 ENCOUNTER — Other Ambulatory Visit: Payer: Self-pay | Admitting: Gastroenterology

## 2014-11-20 ENCOUNTER — Other Ambulatory Visit: Payer: Self-pay | Admitting: Nurse Practitioner

## 2014-11-21 ENCOUNTER — Other Ambulatory Visit: Payer: Self-pay | Admitting: Internal Medicine

## 2014-11-21 ENCOUNTER — Other Ambulatory Visit: Payer: Self-pay | Admitting: Nurse Practitioner

## 2014-11-22 ENCOUNTER — Other Ambulatory Visit: Payer: Self-pay

## 2014-11-22 MED ORDER — ACYCLOVIR 5 % EX OINT: 1.0000 "application " | TOPICAL_OINTMENT | CUTANEOUS | Status: DC

## 2014-11-22 NOTE — Telephone Encounter (Signed)
Last seen 11/10/14 MMM This med not on EPIC: list

## 2014-11-29 ENCOUNTER — Ambulatory Visit (INDEPENDENT_AMBULATORY_CARE_PROVIDER_SITE_OTHER): Payer: Medicare Other | Admitting: Internal Medicine

## 2014-11-29 ENCOUNTER — Encounter: Payer: Self-pay | Admitting: Internal Medicine

## 2014-11-29 VITALS — BP 118/84 | HR 67 | Ht 61.0 in | Wt 159.8 lb

## 2014-11-29 DIAGNOSIS — R079 Chest pain, unspecified: Secondary | ICD-10-CM

## 2014-11-29 DIAGNOSIS — R072 Precordial pain: Secondary | ICD-10-CM | POA: Diagnosis not present

## 2014-11-29 DIAGNOSIS — I10 Essential (primary) hypertension: Secondary | ICD-10-CM | POA: Diagnosis not present

## 2014-11-29 MED ORDER — NITROGLYCERIN 0.4 MG SL SUBL: 0.4000 mg | SUBLINGUAL_TABLET | SUBLINGUAL | Status: DC | PRN

## 2014-11-29 NOTE — Addendum Note (Signed)
Addended by: Sherri Rad C on: 11/29/2014 05:56 PM   Modules accepted: Orders, Medications

## 2014-11-29 NOTE — Progress Notes (Signed)
HPI [name] returns today for followup. She is a 59 year old woman with a history of recurrent syncope, hypertension, and palpitations.  She has severe autonomic dysfunction. Her blood pressures routinely go into the 70's. At other times they go up to 170. She describes an episode of chest pressure and nausea and diaphoresis. She did not initially seek medical attention. She notes that her blood pressure continues to be up and down. She has also had worsening sob.   Allergies  Allergen Reactions  . Codeine Other (See Comments)    "I will have a heart attack."  . Morphine And Related Other (See Comments)    "It will cause me to have a heart attack."  . Lyrica [Pregabalin] Swelling and Other (See Comments)    Weight gain  . Neurontin [Gabapentin] Swelling     Current Outpatient Prescriptions  Medication Sig Dispense Refill  . acyclovir ointment (ZOVIRAX) 5 % Apply 1 application topically every 3 (three) hours. 30 g 1  . albuterol (PROVENTIL) (2.5 MG/3ML) 0.083% nebulizer solution INHALE ONE VIAL IN NEBULIZER EVERY 4 HOURS AS NEEDED FOR WHEEZING. 180 mL 5  . baclofen (LIORESAL) 10 MG tablet Take 1 tablet (10 mg total) by mouth 3 (three) times daily. 90 each 2  . busPIRone (BUSPAR) 10 MG tablet Take 10 mg by mouth 2 (two) times daily.    . butalbital-acetaminophen-caffeine (FIORICET, ESGIC) 50-325-40 MG per tablet TAKE 1 TABLET TWICE DAILY AS NEEDED FOR TENSION HEADACHE. 60 tablet 1  . ciprofloxacin (CIPRO) 500 MG tablet Take 1 tablet (500 mg total) by mouth 2 (two) times daily. 10 tablet 0  . CRESTOR 10 MG tablet Take 1 tablet (10 mg total) by mouth daily. 30 tablet 5  . DEXILANT 60 MG capsule TAKE 1 CAPSULE BY MOUTH ONCE A DAY. 30 capsule 0  . diazepam (VALIUM) 5 MG tablet TAKE 1 TABLET EVERY 12 HOURS AS NEEDED FOR ANXIETY. 60 tablet 2  . DULoxetine (CYMBALTA) 60 MG capsule Take 1 capsule (60 mg total) by mouth at bedtime. 30 capsule 5  . escitalopram (LEXAPRO) 20 MG tablet TAKE (1)  TABLET BY MOUTH ONCE DAILY. 30 tablet 2  . fludrocortisone (FLORINEF) 0.1 MG tablet Take 1 tablet (0.1 mg total) by mouth daily.    . fludrocortisone (FLORINEF) 0.1 MG tablet Take 1 tablet (0.1 mg total) by mouth daily. 30 tablet 0  . furosemide (LASIX) 40 MG tablet Take 1 tablet twice daily 60 tablet 3  . HYDROcodone-acetaminophen (NORCO) 7.5-325 MG per tablet Take 1 tablet by mouth 3 (three) times daily. 45 tablet 0  . ipratropium (ATROVENT) 0.02 % nebulizer solution INHALE 1 VIAL IN NEBULIZER EVERY 4 HOURS AS NEEDED FOR WHEEZING. 150 mL 5  . levETIRAcetam (KEPPRA) 500 MG tablet TAKE (1) TABLET TWICE DAILY. 60 tablet 5  . Loratadine 10 MG CAPS Take 1 capsule (10 mg total) by mouth daily. 90 each 3  . methocarbamol (ROBAXIN) 500 MG tablet Take 500 mg by mouth every 8 (eight) hours as needed.    . midodrine (PROAMATINE) 2.5 MG tablet Take 1 tablet (2.5 mg total) by mouth 3 (three) times daily with meals. 90 tablet 3  . midodrine (PROAMATINE) 5 MG tablet TAKE (1/2) TABLET THREE TIMES DAILY. 45 tablet 1  . nitroGLYCERIN (NITROLINGUAL) 0.4 MG/SPRAY spray USE (1) SPRAY UNDER TONGUE AS NEEDED FOR CHEST PAIN. 12 g 2  . PROAIR HFA 108 (90 BASE) MCG/ACT inhaler INHALE 2 PUFFS EVERY 6 HOURS AS NEEDED. 8.5 g 4  .  ranitidine (ZANTAC) 150 MG tablet Take 1 tablet (150 mg total) by mouth at bedtime. 30 tablet 5  . tolterodine (DETROL) 2 MG tablet Take 1 tablet (2 mg total) by mouth 2 (two) times daily. 60 tablet 5  . traZODone (DESYREL) 150 MG tablet Take 1 tablet by mouth daily.    . valACYclovir (VALTREX) 500 MG tablet TAKE (1) TABLET TWICE DAILY. 60 tablet 2   No current facility-administered medications for this visit.     Past Medical History  Diagnosis Date  . Anxiety   . Herpes simplex infection   . Osteoporosis   . Hypertension   . Dyslipidemia   . Depression   . Asthma   . Fibromyalgia   . GERD (gastroesophageal reflux disease)   . Head injury, unspecified   . Supraventricular  tachycardia   . Syncope and collapse     s/p ILR; no arhythmogenic cause identified  . Coronary artery disease     reported hx of "MI";  Echo 2009 with normal LVF;  Myoview 05/2011: no ischemia  . Spondylolisthesis   . Spinal stenosis of lumbar region   . Hyperlipidemia   . Bipolar 1 disorder   . Insomnia   . Arthritis     RHEUMATOID  . Esophageal stricture   . Myocardial infarction   . Anginal pain     last time   . Status post placement of implantable loop recorder   . Sleep apnea     ? neg  . COPD (chronic obstructive pulmonary disease)   . Shortness of breath   . Pneumonia     hx  . History of kidney stones   . UTI (lower urinary tract infection)   . H/O hiatal hernia   . Seizures     last 3 weeks ago- prescribed keppra -never took didnt like side effects read about online    ROS:   All systems reviewed and negative except as noted in the HPI.   Past Surgical History  Procedure Laterality Date  . Insertion of implatable loop recorder  08/11/2007    [name]  . Head up tilt table testing  06/15/2007    [name]  . Back surgery    . Doppler echocardiography  2009  . Tubal ligation    . Cholecystectomy    . Cystoscopy      stone  . Hemorrhoid surgery    . Breast surgery      lumpectomy  . Posterior cervical fusion/foraminotomy N/A 12/19/2013    Procedure: RIGHT C3-4.C4-5 AND C5-6 FORAMINOTOMIES;  Surgeon: Kerrin Champagne, MD;  Location: [facility] OR;  Service: Orthopedics;  Laterality: N/A;     Family History  Problem Relation Age of Onset  . Heart attack Father   . Cancer Father   . Mental illness Father   . Heart attack Brother     stents  . Cancer Mother   . Mental illness Mother   . Colon cancer Maternal Aunt   . Stomach cancer Neg Hx      Social History   Social History  . Marital Status: Single    Spouse Name: N/A  . Number of Children: N/A  . Years of Education: N/A   Occupational History  . Not on file.   Social History Main Topics   . Smoking status: Never Smoker   . Smokeless tobacco: Never Used  . Alcohol Use: No  . Drug Use: No  . Sexual Activity: Not on file  Other Topics Concern  . Not on file   Social History Narrative   Patient lives at home with her brother.   Patient is on disability.    Patient has 8th grade education.    Patient has 6 children, 20 grand-chilren, and 3 great-grand children.      BP 118/84 mmHg  Pulse 67  Ht 5\' 1"  (1.549 m)  Wt 159 lb 12.8 oz (72.485 kg)  BMI 30.21 kg/m2  Physical Exam:  Well appearing middle-aged woman,NAD HEENT: Unremarkable Neck:  6 cm JVD, no thyromegally Back:  No CVA tenderness Lungs:  Clear with no wheezes, rales, or rhonchi. HEART:  Regular rate rhythm, grade 1/6 systolic murmur, no rubs, no clicks Abd:  soft, positive bowel sounds, no organomegally, no rebound, no guarding Ext:  2 plus pulses, no edema, no cyanosis, no clubbing Skin:  No rashes no nodules Neuro:  CN II through XII intact, motor grossly intact   Assess/Plan:

## 2014-11-29 NOTE — Assessment & Plan Note (Signed)
Her blood pressure today is well controlled. I have reviewed her bp log. She is mostly well controlled but does have episodes of very high and low blood pressure. She will undergo watchful waiting.

## 2014-11-29 NOTE — Patient Instructions (Signed)
Medication Instructions: - no changes  Labwork: - none  Procedures/Testing: - Your physician has requested that you have an echocardiogram. Echocardiography is a painless test that uses sound waves to create images of your heart. It provides your doctor with information about the size and shape of your heart and how well your heart's chambers and valves are working. This procedure takes approximately one hour. There are no restrictions for this procedure.  Follow-Up: - Dr. Ladona Ridgel will see you back on an as needed basis.  Any Additional Special Instructions Will Be Listed Below (If Applicable). - none

## 2014-11-29 NOTE — Assessment & Plan Note (Signed)
Her chest pain is not typical for angina but is associated with sob. I will ask her to obtain a 2D echo. Will follow.

## 2014-11-30 ENCOUNTER — Ambulatory Visit (HOSPITAL_COMMUNITY): Payer: Medicare Other | Attending: Cardiology

## 2014-11-30 ENCOUNTER — Other Ambulatory Visit: Payer: Self-pay

## 2014-11-30 DIAGNOSIS — I371 Nonrheumatic pulmonary valve insufficiency: Secondary | ICD-10-CM | POA: Insufficient documentation

## 2014-11-30 DIAGNOSIS — I071 Rheumatic tricuspid insufficiency: Secondary | ICD-10-CM | POA: Diagnosis not present

## 2014-11-30 DIAGNOSIS — I10 Essential (primary) hypertension: Secondary | ICD-10-CM | POA: Insufficient documentation

## 2014-11-30 DIAGNOSIS — E785 Hyperlipidemia, unspecified: Secondary | ICD-10-CM | POA: Diagnosis not present

## 2014-11-30 DIAGNOSIS — R079 Chest pain, unspecified: Secondary | ICD-10-CM | POA: Diagnosis not present

## 2014-11-30 DIAGNOSIS — I340 Nonrheumatic mitral (valve) insufficiency: Secondary | ICD-10-CM | POA: Diagnosis not present

## 2014-12-01 DIAGNOSIS — S92355A Nondisplaced fracture of fifth metatarsal bone, left foot, initial encounter for closed fracture: Secondary | ICD-10-CM | POA: Diagnosis not present

## 2014-12-01 DIAGNOSIS — S82425A Nondisplaced transverse fracture of shaft of left fibula, initial encounter for closed fracture: Secondary | ICD-10-CM | POA: Diagnosis not present

## 2014-12-01 DIAGNOSIS — I10 Essential (primary) hypertension: Secondary | ICD-10-CM | POA: Diagnosis not present

## 2014-12-01 DIAGNOSIS — Z79899 Other long term (current) drug therapy: Secondary | ICD-10-CM | POA: Diagnosis not present

## 2014-12-01 DIAGNOSIS — E780 Pure hypercholesterolemia: Secondary | ICD-10-CM | POA: Diagnosis not present

## 2014-12-01 DIAGNOSIS — F319 Bipolar disorder, unspecified: Secondary | ICD-10-CM | POA: Diagnosis not present

## 2014-12-01 DIAGNOSIS — Z955 Presence of coronary angioplasty implant and graft: Secondary | ICD-10-CM | POA: Diagnosis not present

## 2014-12-01 DIAGNOSIS — S92352A Displaced fracture of fifth metatarsal bone, left foot, initial encounter for closed fracture: Secondary | ICD-10-CM | POA: Diagnosis not present

## 2014-12-01 DIAGNOSIS — K219 Gastro-esophageal reflux disease without esophagitis: Secondary | ICD-10-CM | POA: Diagnosis not present

## 2014-12-01 DIAGNOSIS — A6000 Herpesviral infection of urogenital system, unspecified: Secondary | ICD-10-CM | POA: Diagnosis not present

## 2014-12-01 DIAGNOSIS — F4310 Post-traumatic stress disorder, unspecified: Secondary | ICD-10-CM | POA: Diagnosis not present

## 2014-12-01 DIAGNOSIS — I519 Heart disease, unspecified: Secondary | ICD-10-CM | POA: Diagnosis not present

## 2014-12-01 DIAGNOSIS — I252 Old myocardial infarction: Secondary | ICD-10-CM | POA: Diagnosis not present

## 2014-12-01 DIAGNOSIS — W1842XA Slipping, tripping and stumbling without falling due to stepping into hole or opening, initial encounter: Secondary | ICD-10-CM | POA: Diagnosis not present

## 2014-12-01 DIAGNOSIS — G40909 Epilepsy, unspecified, not intractable, without status epilepticus: Secondary | ICD-10-CM | POA: Diagnosis not present

## 2014-12-01 DIAGNOSIS — J45909 Unspecified asthma, uncomplicated: Secondary | ICD-10-CM | POA: Diagnosis not present

## 2014-12-01 DIAGNOSIS — M79672 Pain in left foot: Secondary | ICD-10-CM | POA: Diagnosis not present

## 2014-12-05 ENCOUNTER — Telehealth: Payer: Self-pay

## 2014-12-05 MED ORDER — VALACYCLOVIR HCL 500 MG PO TABS: ORAL_TABLET | ORAL | Status: DC

## 2014-12-05 NOTE — Telephone Encounter (Signed)
Let patient know ins will not pay for ointmnet - does she want pils- may already have pills

## 2014-12-05 NOTE — Telephone Encounter (Signed)
Looking at chart already on valtrex and it is the same meds

## 2014-12-05 NOTE — Addendum Note (Signed)
Addended by: Tamera Punt on: 12/05/2014 03:52 PM   Modules accepted: Orders

## 2014-12-05 NOTE — Telephone Encounter (Signed)
Patient would like a rx for the pills and she wanted me to let you know that she broke her foot Friday also.

## 2014-12-05 NOTE — Telephone Encounter (Signed)
Insurance denied Acyclovir ointment  Must try and fail valacyclovir or famciclovir

## 2014-12-06 DIAGNOSIS — S92352A Displaced fracture of fifth metatarsal bone, left foot, initial encounter for closed fracture: Secondary | ICD-10-CM | POA: Diagnosis not present

## 2014-12-06 DIAGNOSIS — R262 Difficulty in walking, not elsewhere classified: Secondary | ICD-10-CM | POA: Diagnosis not present

## 2014-12-07 DIAGNOSIS — F4310 Post-traumatic stress disorder, unspecified: Secondary | ICD-10-CM | POA: Diagnosis not present

## 2014-12-13 DIAGNOSIS — R569 Unspecified convulsions: Secondary | ICD-10-CM | POA: Diagnosis not present

## 2014-12-13 DIAGNOSIS — F319 Bipolar disorder, unspecified: Secondary | ICD-10-CM | POA: Diagnosis not present

## 2014-12-13 DIAGNOSIS — J449 Chronic obstructive pulmonary disease, unspecified: Secondary | ICD-10-CM | POA: Diagnosis not present

## 2014-12-13 DIAGNOSIS — J309 Allergic rhinitis, unspecified: Secondary | ICD-10-CM | POA: Diagnosis not present

## 2014-12-13 DIAGNOSIS — M79662 Pain in left lower leg: Secondary | ICD-10-CM | POA: Diagnosis not present

## 2014-12-13 DIAGNOSIS — S92909A Unspecified fracture of unspecified foot, initial encounter for closed fracture: Secondary | ICD-10-CM | POA: Diagnosis not present

## 2014-12-13 DIAGNOSIS — I10 Essential (primary) hypertension: Secondary | ICD-10-CM | POA: Diagnosis not present

## 2014-12-13 DIAGNOSIS — M7989 Other specified soft tissue disorders: Secondary | ICD-10-CM | POA: Diagnosis not present

## 2014-12-13 DIAGNOSIS — I252 Old myocardial infarction: Secondary | ICD-10-CM | POA: Diagnosis not present

## 2014-12-13 DIAGNOSIS — Z950 Presence of cardiac pacemaker: Secondary | ICD-10-CM | POA: Diagnosis not present

## 2014-12-13 DIAGNOSIS — I4891 Unspecified atrial fibrillation: Secondary | ICD-10-CM | POA: Diagnosis not present

## 2014-12-18 DIAGNOSIS — N390 Urinary tract infection, site not specified: Secondary | ICD-10-CM | POA: Diagnosis not present

## 2014-12-18 DIAGNOSIS — J441 Chronic obstructive pulmonary disease with (acute) exacerbation: Secondary | ICD-10-CM | POA: Diagnosis not present

## 2014-12-18 DIAGNOSIS — R350 Frequency of micturition: Secondary | ICD-10-CM | POA: Diagnosis not present

## 2014-12-18 DIAGNOSIS — S8265XA Nondisplaced fracture of lateral malleolus of left fibula, initial encounter for closed fracture: Secondary | ICD-10-CM | POA: Diagnosis not present

## 2014-12-18 DIAGNOSIS — S92352A Displaced fracture of fifth metatarsal bone, left foot, initial encounter for closed fracture: Secondary | ICD-10-CM | POA: Diagnosis not present

## 2014-12-18 DIAGNOSIS — S92353A Displaced fracture of fifth metatarsal bone, unspecified foot, initial encounter for closed fracture: Secondary | ICD-10-CM | POA: Diagnosis not present

## 2014-12-18 DIAGNOSIS — S92309A Fracture of unspecified metatarsal bone(s), unspecified foot, initial encounter for closed fracture: Secondary | ICD-10-CM | POA: Diagnosis not present

## 2014-12-18 DIAGNOSIS — F419 Anxiety disorder, unspecified: Secondary | ICD-10-CM | POA: Diagnosis not present

## 2014-12-25 DIAGNOSIS — G40909 Epilepsy, unspecified, not intractable, without status epilepticus: Secondary | ICD-10-CM | POA: Diagnosis present

## 2014-12-25 DIAGNOSIS — I4891 Unspecified atrial fibrillation: Secondary | ICD-10-CM | POA: Diagnosis present

## 2014-12-25 DIAGNOSIS — J449 Chronic obstructive pulmonary disease, unspecified: Secondary | ICD-10-CM | POA: Diagnosis present

## 2014-12-25 DIAGNOSIS — R05 Cough: Secondary | ICD-10-CM | POA: Diagnosis not present

## 2014-12-25 DIAGNOSIS — K5730 Diverticulosis of large intestine without perforation or abscess without bleeding: Secondary | ICD-10-CM | POA: Diagnosis not present

## 2014-12-25 DIAGNOSIS — E860 Dehydration: Secondary | ICD-10-CM | POA: Diagnosis present

## 2014-12-25 DIAGNOSIS — Z950 Presence of cardiac pacemaker: Secondary | ICD-10-CM | POA: Diagnosis not present

## 2014-12-25 DIAGNOSIS — R509 Fever, unspecified: Secondary | ICD-10-CM | POA: Diagnosis not present

## 2014-12-25 DIAGNOSIS — F319 Bipolar disorder, unspecified: Secondary | ICD-10-CM | POA: Diagnosis present

## 2014-12-25 DIAGNOSIS — I252 Old myocardial infarction: Secondary | ICD-10-CM | POA: Diagnosis not present

## 2014-12-25 DIAGNOSIS — K5792 Diverticulitis of intestine, part unspecified, without perforation or abscess without bleeding: Secondary | ICD-10-CM | POA: Diagnosis not present

## 2014-12-25 DIAGNOSIS — K5732 Diverticulitis of large intestine without perforation or abscess without bleeding: Secondary | ICD-10-CM | POA: Diagnosis not present

## 2014-12-25 DIAGNOSIS — N189 Chronic kidney disease, unspecified: Secondary | ICD-10-CM | POA: Diagnosis present

## 2014-12-25 DIAGNOSIS — I9589 Other hypotension: Secondary | ICD-10-CM | POA: Diagnosis present

## 2015-01-03 DIAGNOSIS — M19012 Primary osteoarthritis, left shoulder: Secondary | ICD-10-CM | POA: Diagnosis not present

## 2015-01-03 DIAGNOSIS — S92309A Fracture of unspecified metatarsal bone(s), unspecified foot, initial encounter for closed fracture: Secondary | ICD-10-CM | POA: Diagnosis not present

## 2015-01-03 DIAGNOSIS — M25512 Pain in left shoulder: Secondary | ICD-10-CM | POA: Diagnosis not present

## 2015-01-03 DIAGNOSIS — S92352A Displaced fracture of fifth metatarsal bone, left foot, initial encounter for closed fracture: Secondary | ICD-10-CM | POA: Diagnosis not present

## 2015-01-08 IMAGING — CR DG SHOULDER 2+V*R*
3 series · 3 of 3 positions shown · non-contrast
Comparison: None

CLINICAL DATA: Shoulder pain

EXAM:
RIGHT SHOULDER - 2+ VIEW

[view not recorded (1 of 3)]
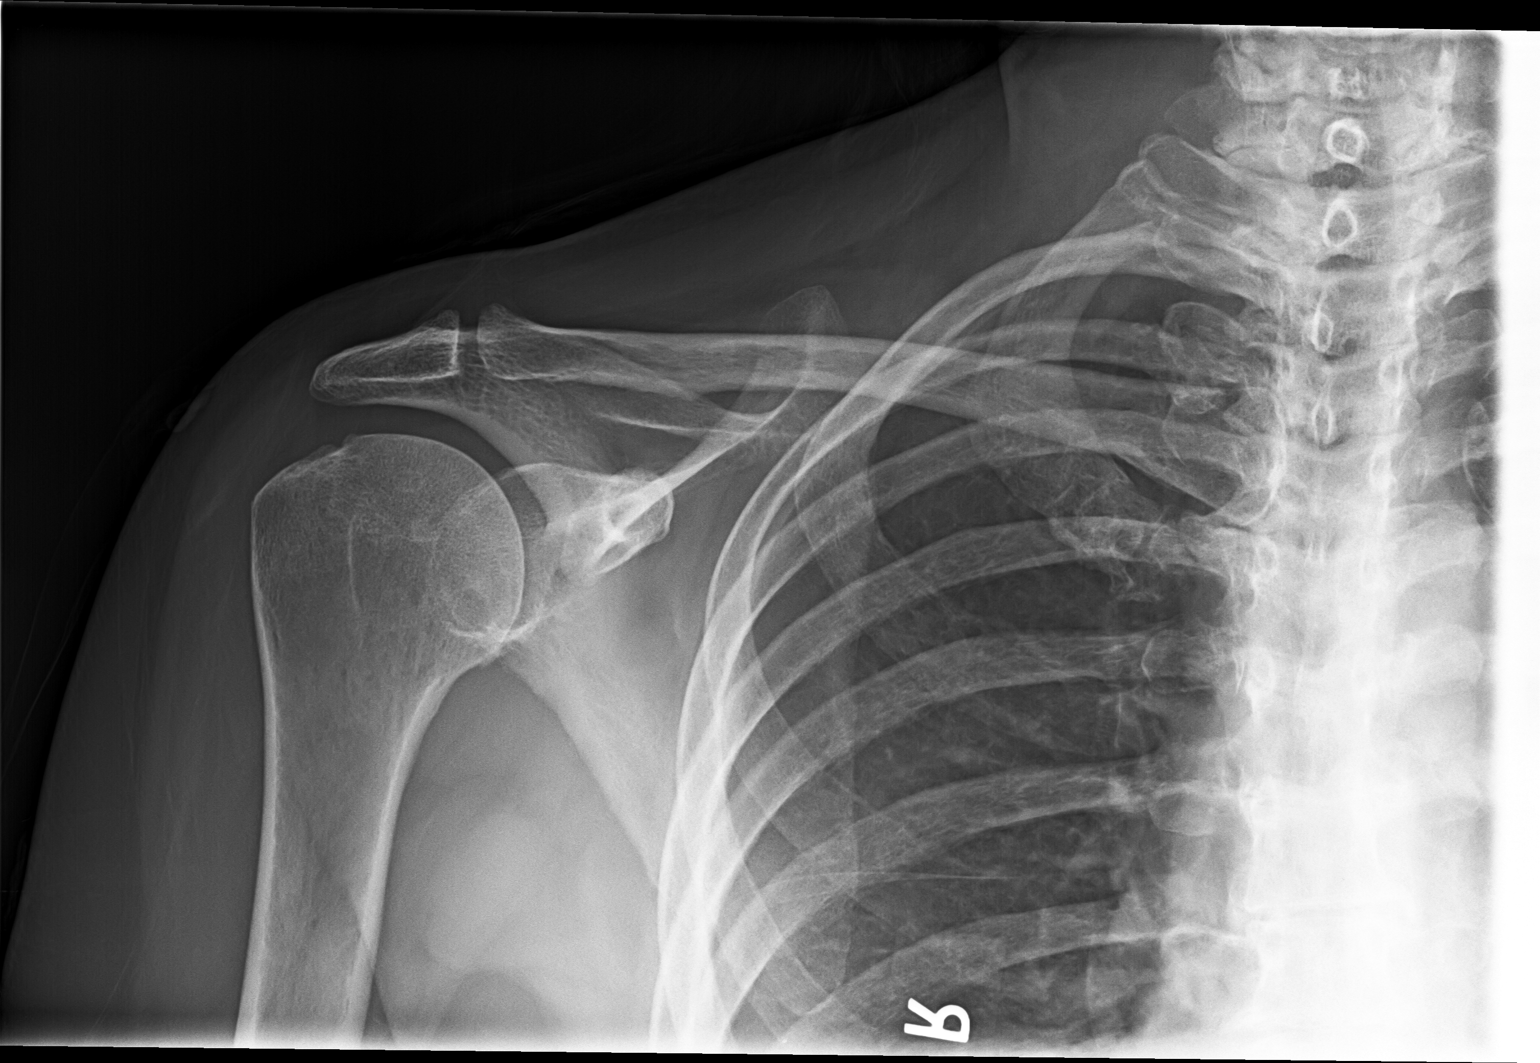

[view not recorded (2 of 3)]
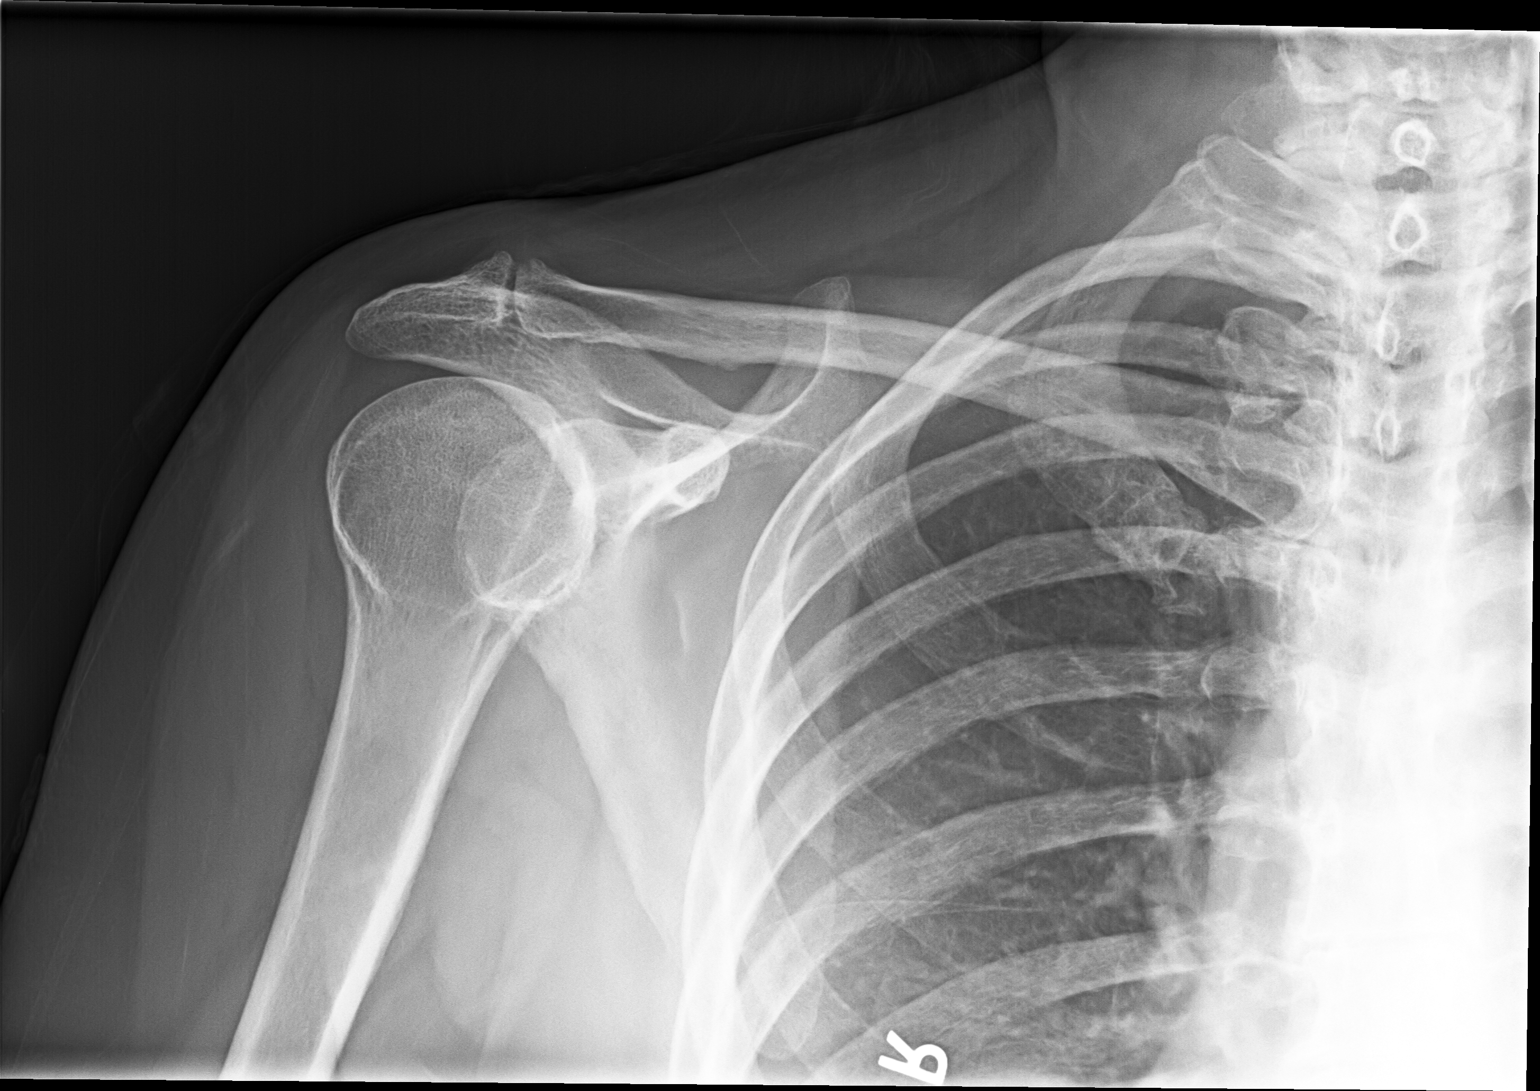

[view not recorded (3 of 3)]
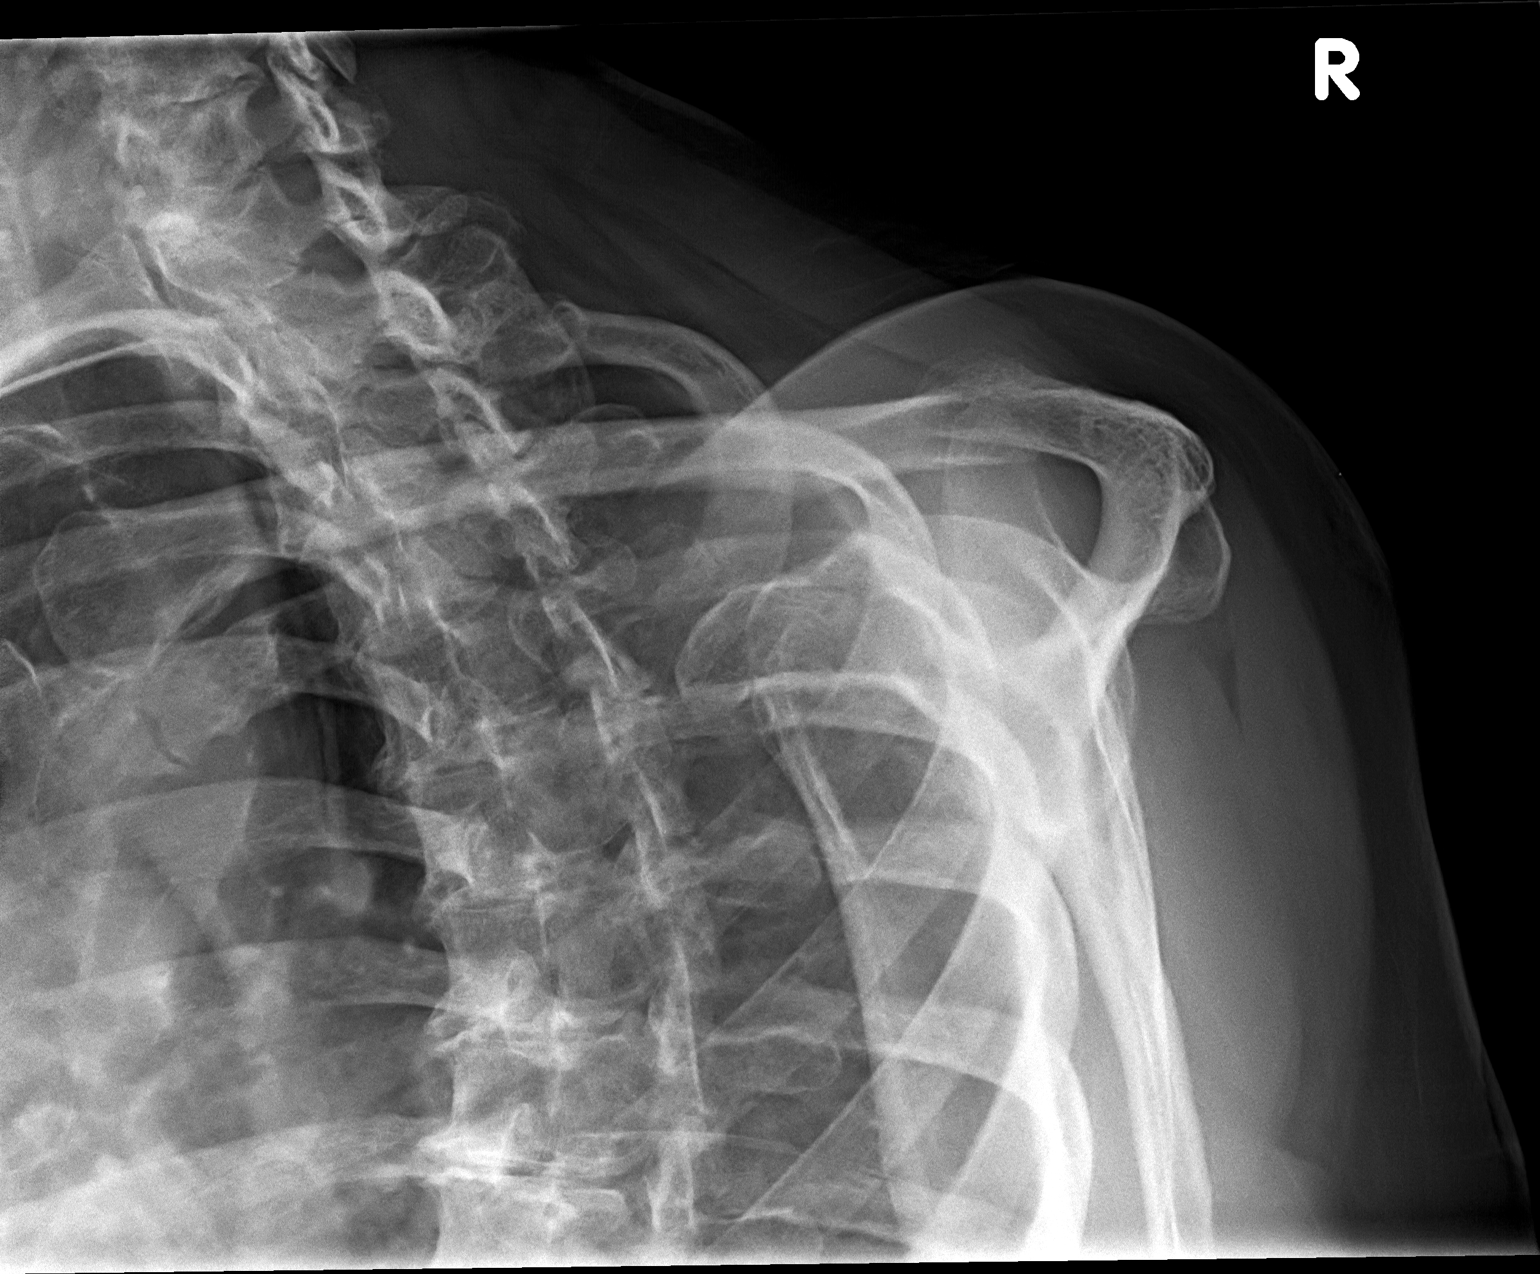

[3 of 3 positions shown; findings below may reference images not displayed]

FINDINGS: Question mild osseous demineralization.

AC joint alignment normal with mild degenerative changes noted.

No acute fracture, dislocation, or bone destruction.

Visualized right ribs unremarkable.

Facet degenerative changes noted at inferior cervical spine.
IMPRESSION: Degenerative changes at AC joint and at facet joints of the inferior
cervical spine

Mild osseous demineralization.

## 2015-01-15 DIAGNOSIS — B001 Herpesviral vesicular dermatitis: Secondary | ICD-10-CM | POA: Diagnosis not present

## 2015-01-15 DIAGNOSIS — I959 Hypotension, unspecified: Secondary | ICD-10-CM | POA: Diagnosis not present

## 2015-01-15 DIAGNOSIS — E785 Hyperlipidemia, unspecified: Secondary | ICD-10-CM | POA: Diagnosis not present

## 2015-01-15 DIAGNOSIS — T148 Other injury of unspecified body region: Secondary | ICD-10-CM | POA: Diagnosis not present

## 2015-01-15 DIAGNOSIS — G40909 Epilepsy, unspecified, not intractable, without status epilepticus: Secondary | ICD-10-CM | POA: Diagnosis not present

## 2015-01-15 DIAGNOSIS — Z139 Encounter for screening, unspecified: Secondary | ICD-10-CM | POA: Diagnosis not present

## 2015-01-15 DIAGNOSIS — K219 Gastro-esophageal reflux disease without esophagitis: Secondary | ICD-10-CM | POA: Diagnosis not present

## 2015-01-15 DIAGNOSIS — J449 Chronic obstructive pulmonary disease, unspecified: Secondary | ICD-10-CM | POA: Diagnosis not present

## 2015-01-15 DIAGNOSIS — Z23 Encounter for immunization: Secondary | ICD-10-CM | POA: Diagnosis not present

## 2015-01-25 ENCOUNTER — Ambulatory Visit: Payer: Medicare Other | Admitting: Nurse Practitioner

## 2015-01-26 ENCOUNTER — Encounter: Payer: Self-pay | Admitting: Nurse Practitioner

## 2015-01-29 DIAGNOSIS — J441 Chronic obstructive pulmonary disease with (acute) exacerbation: Secondary | ICD-10-CM | POA: Diagnosis not present

## 2015-01-29 DIAGNOSIS — I10 Essential (primary) hypertension: Secondary | ICD-10-CM | POA: Diagnosis not present

## 2015-01-29 DIAGNOSIS — J449 Chronic obstructive pulmonary disease, unspecified: Secondary | ICD-10-CM | POA: Diagnosis not present

## 2015-01-29 DIAGNOSIS — B001 Herpesviral vesicular dermatitis: Secondary | ICD-10-CM | POA: Diagnosis not present

## 2015-01-29 DIAGNOSIS — G40909 Epilepsy, unspecified, not intractable, without status epilepticus: Secondary | ICD-10-CM | POA: Diagnosis not present

## 2015-02-14 DIAGNOSIS — H25813 Combined forms of age-related cataract, bilateral: Secondary | ICD-10-CM | POA: Diagnosis not present

## 2015-02-14 DIAGNOSIS — Z79899 Other long term (current) drug therapy: Secondary | ICD-10-CM | POA: Diagnosis not present

## 2015-02-14 DIAGNOSIS — H52222 Regular astigmatism, left eye: Secondary | ICD-10-CM | POA: Diagnosis not present

## 2015-02-14 DIAGNOSIS — H524 Presbyopia: Secondary | ICD-10-CM | POA: Diagnosis not present

## 2015-02-14 DIAGNOSIS — H5203 Hypermetropia, bilateral: Secondary | ICD-10-CM | POA: Diagnosis not present

## 2015-03-01 ENCOUNTER — Telehealth: Payer: Self-pay | Admitting: Nurse Practitioner

## 2015-03-02 MED ORDER — ESCITALOPRAM OXALATE 20 MG PO TABS: ORAL_TABLET | ORAL | Status: DC

## 2015-03-02 NOTE — Telephone Encounter (Signed)
done

## 2015-03-08 ENCOUNTER — Encounter: Payer: Self-pay | Admitting: Cardiology

## 2015-03-08 ENCOUNTER — Ambulatory Visit (INDEPENDENT_AMBULATORY_CARE_PROVIDER_SITE_OTHER): Payer: Medicare Other | Admitting: Cardiology

## 2015-03-08 VITALS — BP 111/75 | HR 104 | Ht 61.0 in | Wt 143.2 lb

## 2015-03-08 DIAGNOSIS — I10 Essential (primary) hypertension: Secondary | ICD-10-CM | POA: Diagnosis not present

## 2015-03-08 DIAGNOSIS — R079 Chest pain, unspecified: Secondary | ICD-10-CM

## 2015-03-08 DIAGNOSIS — R55 Syncope and collapse: Secondary | ICD-10-CM

## 2015-03-08 NOTE — Progress Notes (Signed)
Patient ID: [name], female   DOB: May 27, 1955, 59 y.o.   MRN: [id number]     Clinical Summary [name] is a 59 y.o.female seen today for follow up of the following medical problems.   1. Chest pain - several year history of chest pain - normal cath in 2013, normal stress test 2013 - admit to [facility] 07/2014 with chest pain. Negative workup for ACS. She was discharged with an out patient Lexiscan which showed no evidence of ischemia.  - recent echo 11/2014 LVEF 55-60%, no WMAs.   -  2 episodes of chest pain since last visit, resolved with NG.   2. Recurrent syncope - followed by Dr Ladona Ridgel, according to notes prior loop recorder showed no clear arrhythmias - thought to be neurally mediated syncope secondary to vasodepression according to notes, has been started on florinef daily and midodrine prn   - no recent episodes of passing out.  3. HTN - reports labile blood pressures at home, overall few low pressures since being started on florinef and prn midodrine   4. HL - 10/2014: TC 200 TG 116 HDL 67 LDL 110 - compliant with crestor Past Medical History  Diagnosis Date  . Anxiety   . Herpes simplex infection   . Osteoporosis   . Hypertension   . Dyslipidemia   . Depression   . Asthma   . Fibromyalgia   . GERD (gastroesophageal reflux disease)   . Head injury, unspecified   . Supraventricular tachycardia   . Syncope and collapse     s/p ILR; no arhythmogenic cause identified  . Coronary artery disease     reported hx of "MI";  Echo 2009 with normal LVF;  Myoview 05/2011: no ischemia  . Spondylolisthesis   . Spinal stenosis of lumbar region   . Hyperlipidemia   . Bipolar 1 disorder   . Insomnia   . Arthritis     RHEUMATOID  . Esophageal stricture   . Myocardial infarction (HCC)   . Anginal pain     last time   . Status post placement of implantable loop recorder   . Sleep apnea     ? neg  . COPD (chronic obstructive pulmonary disease)   . Shortness of  breath   . Pneumonia     hx  . History of kidney stones   . UTI (lower urinary tract infection)   . H/O hiatal hernia   . Seizures     last 3 weeks ago- prescribed keppra -never took didnt like side effects read about online     Allergies  Allergen Reactions  . Codeine Other (See Comments)    "I will have a heart attack."  . Morphine And Related Other (See Comments)    "It will cause me to have a heart attack."  . Lyrica [Pregabalin] Swelling and Other (See Comments)    Weight gain  . Neurontin [Gabapentin] Swelling     Current Outpatient Prescriptions  Medication Sig Dispense Refill  . acyclovir ointment (ZOVIRAX) 5 % Apply 1 application topically every 3 (three) hours. 30 g 1  . albuterol (PROVENTIL) (2.5 MG/3ML) 0.083% nebulizer solution INHALE ONE VIAL IN NEBULIZER EVERY 4 HOURS AS NEEDED FOR WHEEZING. 180 mL 5  . baclofen (LIORESAL) 10 MG tablet Take 1 tablet (10 mg total) by mouth 3 (three) times daily. 90 each 2  . busPIRone (BUSPAR) 10 MG tablet Take 10 mg by mouth 2 (two) times daily.    . butalbital-acetaminophen-caffeine (FIORICET, ESGIC)  50-325-40 MG per tablet TAKE 1 TABLET TWICE DAILY AS NEEDED FOR TENSION HEADACHE. 60 tablet 1  . ciprofloxacin (CIPRO) 500 MG tablet Take 1 tablet (500 mg total) by mouth 2 (two) times daily. 10 tablet 0  . CRESTOR 10 MG tablet Take 1 tablet (10 mg total) by mouth daily. 30 tablet 5  . DEXILANT 60 MG capsule TAKE 1 CAPSULE BY MOUTH ONCE A DAY. 30 capsule 0  . diazepam (VALIUM) 5 MG tablet TAKE 1 TABLET EVERY 12 HOURS AS NEEDED FOR ANXIETY. 60 tablet 2  . DULoxetine (CYMBALTA) 60 MG capsule Take 1 capsule (60 mg total) by mouth at bedtime. 30 capsule 5  . escitalopram (LEXAPRO) 20 MG tablet TAKE (1) TABLET BY MOUTH ONCE DAILY. 30 tablet 1  . fludrocortisone (FLORINEF) 0.1 MG tablet Take 1 tablet (0.1 mg total) by mouth daily.    . fludrocortisone (FLORINEF) 0.1 MG tablet Take 1 tablet (0.1 mg total) by mouth daily. 30 tablet 0  .  furosemide (LASIX) 40 MG tablet Take 1 tablet twice daily 60 tablet 3  . HYDROcodone-acetaminophen (NORCO) 7.5-325 MG per tablet Take 1 tablet by mouth 3 (three) times daily. 45 tablet 0  . ipratropium (ATROVENT) 0.02 % nebulizer solution INHALE 1 VIAL IN NEBULIZER EVERY 4 HOURS AS NEEDED FOR WHEEZING. 150 mL 5  . levETIRAcetam (KEPPRA) 500 MG tablet TAKE (1) TABLET TWICE DAILY. 60 tablet 5  . Loratadine 10 MG CAPS Take 1 capsule (10 mg total) by mouth daily. 90 each 3  . methocarbamol (ROBAXIN) 500 MG tablet Take 500 mg by mouth every 8 (eight) hours as needed.    . midodrine (PROAMATINE) 2.5 MG tablet Take 1 tablet (2.5 mg total) by mouth 3 (three) times daily with meals. 90 tablet 3  . midodrine (PROAMATINE) 5 MG tablet TAKE (1/2) TABLET THREE TIMES DAILY. 45 tablet 1  . nitroGLYCERIN (NITROSTAT) 0.4 MG SL tablet Place 1 tablet (0.4 mg total) under the tongue every 5 (five) minutes as needed for chest pain. 25 tablet 3  . PROAIR HFA 108 (90 BASE) MCG/ACT inhaler INHALE 2 PUFFS EVERY 6 HOURS AS NEEDED. 8.5 g 4  . ranitidine (ZANTAC) 150 MG tablet Take 1 tablet (150 mg total) by mouth at bedtime. 30 tablet 5  . tolterodine (DETROL) 2 MG tablet Take 1 tablet (2 mg total) by mouth 2 (two) times daily. 60 tablet 5  . traZODone (DESYREL) 150 MG tablet Take 1 tablet by mouth daily.    . valACYclovir (VALTREX) 500 MG tablet TAKE (1) TABLET TWICE DAILY. 60 tablet 2   No current facility-administered medications for this visit.     Past Surgical History  Procedure Laterality Date  . Insertion of implatable loop recorder  08/11/2007    [name]  . Head up tilt table testing  06/15/2007    [name]  . Back surgery    . Doppler echocardiography  2009  . Tubal ligation    . Cholecystectomy    . Cystoscopy      stone  . Hemorrhoid surgery    . Breast surgery      lumpectomy  . Posterior cervical fusion/foraminotomy N/A 12/19/2013    Procedure: RIGHT C3-4.C4-5 AND C5-6 FORAMINOTOMIES;   Surgeon: Kerrin Champagne, MD;  Location: [facility] OR;  Service: Orthopedics;  Laterality: N/A;     Allergies  Allergen Reactions  . Codeine Other (See Comments)    "I will have a heart attack."  . Morphine And Related Other (See  Comments)    "It will cause me to have a heart attack."  . Lyrica [Pregabalin] Swelling and Other (See Comments)    Weight gain  . Neurontin [Gabapentin] Swelling      Family History  Problem Relation Age of Onset  . Heart attack Father   . Cancer Father   . Mental illness Father   . Heart attack Brother     stents  . Cancer Mother   . Mental illness Mother   . Colon cancer Maternal Aunt   . Stomach cancer Neg Hx      Social History Ms. Gasner reports that she has never smoked. She has never used smokeless tobacco. Ms. Meece reports that she does not drink alcohol.   Review of Systems CONSTITUTIONAL: No weight loss, fever, chills, weakness or fatigue.  HEENT: Eyes: No visual loss, blurred vision, double vision or yellow sclerae.No hearing loss, sneezing, congestion, runny nose or sore throat.  SKIN: No rash or itching.  CARDIOVASCULAR: per hpi RESPIRATORY: No shortness of breath, cough or sputum.  GASTROINTESTINAL: No anorexia, nausea, vomiting or diarrhea. No abdominal pain or blood.  GENITOURINARY: No burning on urination, no polyuria NEUROLOGICAL: No headache, dizziness, syncope, paralysis, ataxia, numbness or tingling in the extremities. No change in bowel or bladder control.  MUSCULOSKELETAL: No muscle, back pain, joint pain or stiffness.  LYMPHATICS: No enlarged nodes. No history of splenectomy.  PSYCHIATRIC: No history of depression or anxiety.  ENDOCRINOLOGIC: No reports of sweating, cold or heat intolerance. No polyuria or polydipsia.  [name]   Physical Examination Filed Vitals:   03/08/15 1323  BP: 111/75  Pulse: 104   Filed Vitals:   03/08/15 1323  Height: 5\' 1"  (1.549 m)  Weight: 143 lb 3.2 oz (64.955 kg)    Gen: resting  comfortably, no acute distress HEENT: no scleral icterus, pupils equal round and reactive, no palptable cervical adenopathy,  CV:RRR, no m/r/g, nojvd Resp: Clear to auscultation bilaterally GI: abdomen is soft, non-tender, non-distended, normal bowel sounds, no hepatosplenomegaly MSK: extremities are warm, no edema.  Skin: warm, no rash Neuro:  no focal deficits Psych: appropriate affect   Diagnostic Studies   05/2011 Lexiscan MPI No ischemia   Jan 2009 Echo LEFT VENTRICLE: - Left ventricular size was normal. - Overall left ventricular systolic function was normal. - There were no left ventricular regional wall motion abnormalities. - Left ventricular wall thickness was normal.  AORTIC VALVE: - The aortic valve was trileaflet. - Aortic valve thickness was normal.  AORTA: - The aortic root was normal in size. - The aortic arch was normal.  MITRAL VALVE: - Mitral valve structure was normal.  Doppler interpretation(s): - There was trivial mitral valvular regurgitation.  LEFT ATRIUM: - Left atrial size was normal.  PULMONARY VEINS: - The pulmonary veins were grossly normal.  RIGHT VENTRICLE: - Right ventricular size was normal. - Right ventricular systolic function was normal. - Right ventricular wall thickness was normal.  PULMONIC VALVE: - The structure of the pulmonic valve appeared to be normal.  TRICUSPID VALVE: - The tricuspid valve structure was normal.  Doppler interpretation(s): - There was no significant tricuspid valvular regurgitation.  PULMONARY ARTERY: - The pulmonary artery was normal size.  RIGHT ATRIUM: - Right atrial size was normal.  SYSTEMIC VEINS: - The inferior vena cava was normal.  PERICARDIUM: - There was no pericardial effusion.  ---------------------------------------------------------------  SUMMARY - Overall left ventricular systolic function was normal. There were no left ventricular regional wall motion  abnormalities. -  The pulmonary veins were grossly normal.   09/2011 Cath Hemodynamic Findings:  Central aortic pressure: 108/58  Left ventricular pressure: 107/10/14  Angiographic Findings:  Left main: No obstructive disease noted.  Left Anterior Descending Artery: Moderate to large sized vessel that courses to the apex. There is a moderate sized diagonal Zaylen Susman. No obstructive disease noted.  Circumflex Artery: Large, dominant artery with moderate sized first obtuse marginal Blessyn [place] and left sided posterolateral Meriel Kelliher with no disease noted.  Right Coronary Artery: Small, non-dominant vessel with no disease noted.  Left Ventricular Angiogram:LVEF=65%.  Impression:  1. No angiographic evidence of CAD  2. Normal LV systolic function  3. Non-cardiac chest pain  Recommendations: No further ischemic workup.  Complications: None. The patient tolerated the procedure well.   11/2014 echo Study Conclusions  - Left ventricle: The cavity size was normal. Systolic function was normal. The estimated ejection fraction was in the range of 55% to 60%. Wall motion was normal; there were no regional wall motion abnormalities. There was an increased relative contribution of atrial contraction to ventricular filling. Doppler parameters are consistent with abnormal left ventricular relaxation (grade 1 diastolic dysfunction). - Mitral valve: There was trivial regurgitation. - Tricuspid valve: There was trivial regurgitation. - Pulmonic valve: There was trivial regurgitation.  Assessment and Plan  1. Chest pain - long history of symptoms with multiple negative stress tests, most recently 07/2014 Lexiscan was negative at [facility]. Cath 2013 with patent vessels - continue CAD risk factor modicatoin - no further cardiac workup at this time, asked that she follow up with her pcp for evaluation of noncardiac chest pain.    1. Syncope - no recent episodes, continue florinef and  midodrine   2. Hyperlipidemia - continue current statin  3. Chest pain - long history with negative workup for CAD in the past - continue to follow clinically   F/u 6 months       Antoine Poche, M.D., F.A.C.C.

## 2015-03-08 NOTE — Patient Instructions (Signed)

## 2015-03-12 ENCOUNTER — Encounter: Payer: Self-pay | Admitting: Nurse Practitioner

## 2015-03-12 ENCOUNTER — Ambulatory Visit (INDEPENDENT_AMBULATORY_CARE_PROVIDER_SITE_OTHER): Payer: Medicare Other | Admitting: Nurse Practitioner

## 2015-03-12 VITALS — BP 135/86 | HR 97 | Temp 97.9°F | Ht 61.0 in | Wt 145.0 lb

## 2015-03-12 DIAGNOSIS — G4700 Insomnia, unspecified: Secondary | ICD-10-CM | POA: Diagnosis not present

## 2015-03-12 DIAGNOSIS — F329 Major depressive disorder, single episode, unspecified: Secondary | ICD-10-CM

## 2015-03-12 DIAGNOSIS — I10 Essential (primary) hypertension: Secondary | ICD-10-CM

## 2015-03-12 DIAGNOSIS — R569 Unspecified convulsions: Secondary | ICD-10-CM | POA: Diagnosis not present

## 2015-03-12 DIAGNOSIS — B009 Herpesviral infection, unspecified: Secondary | ICD-10-CM

## 2015-03-12 DIAGNOSIS — F32A Depression, unspecified: Secondary | ICD-10-CM

## 2015-03-12 DIAGNOSIS — R3915 Urgency of urination: Secondary | ICD-10-CM | POA: Diagnosis not present

## 2015-03-12 DIAGNOSIS — E785 Hyperlipidemia, unspecified: Secondary | ICD-10-CM | POA: Diagnosis not present

## 2015-03-12 DIAGNOSIS — K2100 Gastro-esophageal reflux disease with esophagitis, without bleeding: Secondary | ICD-10-CM

## 2015-03-12 DIAGNOSIS — F411 Generalized anxiety disorder: Secondary | ICD-10-CM

## 2015-03-12 MED ORDER — ESCITALOPRAM OXALATE 20 MG PO TABS: ORAL_TABLET | ORAL | Status: DC

## 2015-03-12 MED ORDER — CRESTOR 10 MG PO TABS: 10.0000 mg | ORAL_TABLET | Freq: Every day | ORAL | Status: DC

## 2015-03-12 MED ORDER — VALACYCLOVIR HCL 500 MG PO TABS
ORAL_TABLET | ORAL | Status: DC
Start: 2015-03-12 — End: 2015-10-29

## 2015-03-12 MED ORDER — DULOXETINE HCL 60 MG PO CPEP: 60.0000 mg | ORAL_CAPSULE | Freq: Every day | ORAL | Status: DC

## 2015-03-12 MED ORDER — TRAZODONE HCL 150 MG PO TABS: 150.0000 mg | ORAL_TABLET | Freq: Every day | ORAL | Status: DC

## 2015-03-12 MED ORDER — TOLTERODINE TARTRATE 2 MG PO TABS: 2.0000 mg | ORAL_TABLET | Freq: Two times a day (BID) | ORAL | Status: DC

## 2015-03-12 MED ORDER — RANITIDINE HCL 150 MG PO TABS: 150.0000 mg | ORAL_TABLET | Freq: Every day | ORAL | Status: DC

## 2015-03-12 MED ORDER — DEXLANSOPRAZOLE 60 MG PO CPDR: DELAYED_RELEASE_CAPSULE | ORAL | Status: DC

## 2015-03-12 MED ORDER — LEVETIRACETAM 500 MG PO TABS: ORAL_TABLET | ORAL | Status: DC

## 2015-03-12 MED ORDER — FUROSEMIDE 40 MG PO TABS: 40.0000 mg | ORAL_TABLET | Freq: Every day | ORAL | Status: DC

## 2015-03-12 NOTE — Patient Instructions (Signed)
Health Maintenance, Female Adopting a healthy lifestyle and getting preventive care can go a long way to promote health and wellness. Talk with your health care provider about what schedule of regular examinations is right for you. This is a good chance for you to check in with your provider about disease prevention and staying healthy. In between checkups, there are plenty of things you can do on your own. Experts have done a lot of research about which lifestyle changes and preventive measures are most likely to keep you healthy. Ask your health care provider for more information. WEIGHT AND DIET  Eat a healthy diet  Be sure to include plenty of vegetables, fruits, low-fat dairy products, and lean protein.  Do not eat a lot of foods high in solid fats, added sugars, or salt.  Get regular exercise. This is one of the most important things you can do for your health.  Most adults should exercise for at least 150 minutes each week. The exercise should increase your heart rate and make you sweat (moderate-intensity exercise).  Most adults should also do strengthening exercises at least twice a week. This is in addition to the moderate-intensity exercise.  Maintain a healthy weight  Body mass index (BMI) is a measurement that can be used to identify possible weight problems. It estimates body fat based on height and weight. Your health care provider can help determine your BMI and help you achieve or maintain a healthy weight.  For females 20 years of age and older:   A BMI below 18.5 is considered underweight.  A BMI of 18.5 to 24.9 is normal.  A BMI of 25 to 29.9 is considered overweight.  A BMI of 30 and above is considered obese.  Watch levels of cholesterol and blood lipids  You should start having your blood tested for lipids and cholesterol at 58 years of age, then have this test every 5 years.  You may need to have your cholesterol levels checked more often if:  Your lipid  or cholesterol levels are high.  You are older than 58 years of age.  You are at high risk for heart disease.  CANCER SCREENING   Lung Cancer  Lung cancer screening is recommended for adults 55-80 years old who are at high risk for lung cancer because of a history of smoking.  A yearly low-dose CT scan of the lungs is recommended for people who:  Currently smoke.  Have quit within the past 15 years.  Have at least a 30-pack-year history of smoking. A pack year is smoking an average of one pack of cigarettes a day for 1 year.  Yearly screening should continue until it has been 15 years since you quit.  Yearly screening should stop if you develop a health problem that would prevent you from having lung cancer treatment.  Breast Cancer  Practice breast self-awareness. This means understanding how your breasts normally appear and feel.  It also means doing regular breast self-exams. Let your health care provider know about any changes, no matter how small.  If you are in your 20s or 30s, you should have a clinical breast exam (CBE) by a health care provider every 1-3 years as part of a regular health exam.  If you are 40 or older, have a CBE every year. Also consider having a breast X-ray (mammogram) every year.  If you have a family history of breast cancer, talk to your health care provider about genetic screening.  If you   are at high risk for breast cancer, talk to your health care provider about having an MRI and a mammogram every year.  Breast cancer gene (BRCA) assessment is recommended for women who have family members with BRCA-related cancers. BRCA-related cancers include:  Breast.  Ovarian.  Tubal.  Peritoneal cancers.  Results of the assessment will determine the need for genetic counseling and BRCA1 and BRCA2 testing. Cervical Cancer Your health care provider may recommend that you be screened regularly for cancer of the pelvic organs (ovaries, uterus, and  vagina). This screening involves a pelvic examination, including checking for microscopic changes to the surface of your cervix (Pap test). You may be encouraged to have this screening done every 3 years, beginning at age 21.  For women ages 30-65, health care providers may recommend pelvic exams and Pap testing every 3 years, or they may recommend the Pap and pelvic exam, combined with testing for human papilloma virus (HPV), every 5 years. Some types of HPV increase your risk of cervical cancer. Testing for HPV may also be done on women of any age with unclear Pap test results.  Other health care providers may not recommend any screening for nonpregnant women who are considered low risk for pelvic cancer and who do not have symptoms. Ask your health care provider if a screening pelvic exam is right for you.  If you have had past treatment for cervical cancer or a condition that could lead to cancer, you need Pap tests and screening for cancer for at least 20 years after your treatment. If Pap tests have been discontinued, your risk factors (such as having a new sexual partner) need to be reassessed to determine if screening should resume. Some women have medical problems that increase the chance of getting cervical cancer. In these cases, your health care provider may recommend more frequent screening and Pap tests. Colorectal Cancer  This type of cancer can be detected and often prevented.  Routine colorectal cancer screening usually begins at 58 years of age and continues through 58 years of age.  Your health care provider may recommend screening at an earlier age if you have risk factors for colon cancer.  Your health care provider may also recommend using home test kits to check for hidden blood in the stool.  A small camera at the end of a tube can be used to examine your colon directly (sigmoidoscopy or colonoscopy). This is done to check for the earliest forms of colorectal  cancer.  Routine screening usually begins at age 50.  Direct examination of the colon should be repeated every 5-10 years through 58 years of age. However, you may need to be screened more often if early forms of precancerous polyps or small growths are found. Skin Cancer  Check your skin from head to toe regularly.  Tell your health care provider about any new moles or changes in moles, especially if there is a change in a mole's shape or color.  Also tell your health care provider if you have a mole that is larger than the size of a pencil eraser.  Always use sunscreen. Apply sunscreen liberally and repeatedly throughout the day.  Protect yourself by wearing long sleeves, pants, a wide-brimmed hat, and sunglasses whenever you are outside. HEART DISEASE, DIABETES, AND HIGH BLOOD PRESSURE   High blood pressure causes heart disease and increases the risk of stroke. High blood pressure is more likely to develop in:  People who have blood pressure in the high end   of the normal range (130-139/85-89 mm Hg).  People who are overweight or obese.  People who are African American.  If you are 38-23 years of age, have your blood pressure checked every 3-5 years. If you are 61 years of age or older, have your blood pressure checked every year. You should have your blood pressure measured twice--once when you are at a hospital or clinic, and once when you are not at a hospital or clinic. Record the average of the two measurements. To check your blood pressure when you are not at a hospital or clinic, you can use:  An automated blood pressure machine at a pharmacy.  A home blood pressure monitor.  If you are between 45 years and 39 years old, ask your health care provider if you should take aspirin to prevent strokes.  Have regular diabetes screenings. This involves taking a blood sample to check your fasting blood sugar level.  If you are at a normal weight and have a low risk for diabetes,  have this test once every three years after 58 years of age.  If you are overweight and have a high risk for diabetes, consider being tested at a younger age or more often. PREVENTING INFECTION  Hepatitis B  If you have a higher risk for hepatitis B, you should be screened for this virus. You are considered at high risk for hepatitis B if:  You were born in a country where hepatitis B is common. Ask your health care provider which countries are considered high risk.  Your parents were born in a high-risk country, and you have not been immunized against hepatitis B (hepatitis B vaccine).  You have HIV or AIDS.  You use needles to inject street drugs.  You live with someone who has hepatitis B.  You have had sex with someone who has hepatitis B.  You get hemodialysis treatment.  You take certain medicines for conditions, including cancer, organ transplantation, and autoimmune conditions. Hepatitis C  Blood testing is recommended for:  Everyone born from [birth year] through 1964.  Anyone with known risk factors for hepatitis C. Sexually transmitted infections (STIs)  You should be screened for sexually transmitted infections (STIs) including gonorrhea and chlamydia if:  You are sexually active and are younger than 58 years of age.  You are older than 58 years of age and your health care provider tells you that you are at risk for this type of infection.  Your sexual activity has changed since you were last screened and you are at an increased risk for chlamydia or gonorrhea. Ask your health care provider if you are at risk.  If you do not have HIV, but are at risk, it may be recommended that you take a prescription medicine daily to prevent HIV infection. This is called pre-exposure prophylaxis (PrEP). You are considered at risk if:  You are sexually active and do not regularly use condoms or know the HIV status of your partner(s).  You take drugs by injection.  You are sexually  active with a partner who has HIV. Talk with your health care provider about whether you are at high risk of being infected with HIV. If you choose to begin PrEP, you should first be tested for HIV. You should then be tested every 3 months for as long as you are taking PrEP.  PREGNANCY   If you are premenopausal and you may become pregnant, ask your health care provider about preconception counseling.  If you may  become pregnant, take 400 to 800 micrograms (mcg) of folic acid every day.  If you want to prevent pregnancy, talk to your health care provider about birth control (contraception). OSTEOPOROSIS AND MENOPAUSE   Osteoporosis is a disease in which the bones lose minerals and strength with aging. This can result in serious bone fractures. Your risk for osteoporosis can be identified using a bone density scan.  If you are 61 years of age or older, or if you are at risk for osteoporosis and fractures, ask your health care provider if you should be screened.  Ask your health care provider whether you should take a calcium or vitamin D supplement to lower your risk for osteoporosis.  Menopause may have certain physical symptoms and risks.  Hormone replacement therapy may reduce some of these symptoms and risks. Talk to your health care provider about whether hormone replacement therapy is right for you.  HOME CARE INSTRUCTIONS   Schedule regular health, dental, and eye exams.  Stay current with your immunizations.   Do not use any tobacco products including cigarettes, chewing tobacco, or electronic cigarettes.  If you are pregnant, do not drink alcohol.  If you are breastfeeding, limit how much and how often you drink alcohol.  Limit alcohol intake to no more than 1 drink per day for nonpregnant women. One drink equals 12 ounces of beer, 5 ounces of wine, or 1 ounces of hard liquor.  Do not use street drugs.  Do not share needles.  Ask your health care provider for help if  you need support or information about quitting drugs.  Tell your health care provider if you often feel depressed.  Tell your health care provider if you have ever been abused or do not feel safe at home.   This information is not intended to replace advice given to you by your health care provider. Make sure you discuss any questions you have with your health care provider.   Document Released: 09/30/2010 Document Revised: 04/07/2014 Document Reviewed: 02/16/2013 Elsevier Interactive Patient Education [employer].

## 2015-03-12 NOTE — Progress Notes (Signed)
Subjective:    Patient ID: [name], female    DOB: January 17, 1956, 59 y.o.   MRN: [id number]  HPI  Patient in today for follow up of her chronic medical problems . The list is very extensive. Last time i saw her she said she was moving to [place]. [place] but when she got there the [place]. Since last visit she has broken her footand has had a bout with diverticulitis. Right now she is doing well.   Patient Active Problem List   Diagnosis Date Noted  . Chest pain 11/29/2014  . Insomnia 09/05/2014  . Allergic rhinitis 09/05/2014  . Cervical spondylosis without myelopathy 12/19/2013    Class: Chronic  . Neural foraminal stenosis of cervical spine 12/19/2013  . Preop cardiovascular exam 11/24/2013  . Spinal stenosis in cervical region 09/19/2013  . Cervical radiculitis 09/19/2013  . Dysphagia 08/05/2013  . Lumbosacral spondylosis without myelopathy 11/16/2012  . Postlaminectomy syndrome, lumbar region 11/16/2012  . Sinus tachycardia ([place]) 10/22/2011  . Chest pain, unspecified 09/30/2011  . Herpes simplex virus (HSV) infection 10/31/2008  . Hyperlipidemia with target LDL less than 100 10/31/2008  . Anxiety state 10/31/2008  . Depression 10/31/2008  . Essential hypertension 10/31/2008  . Asthma 10/31/2008  . GERD 10/31/2008  . SPINAL STENOSIS OF LUMBAR REGION 10/31/2008  . Myalgia and myositis 10/31/2008  . Osteoporosis 10/31/2008  . SPONDYLOLISTHESIS 10/31/2008  . SYNCOPE 10/31/2008  . Sleep apnea 10/31/2008  . HEAD TRAUMA, CLOSED 10/31/2008   Outpatient Encounter Prescriptions as of 03/12/2015  Medication Sig  . acyclovir ointment (ZOVIRAX) 5 % Apply 1 application topically every 3 (three) hours.  [name] albuterol (PROVENTIL) (2.5 MG/3ML) 0.083% nebulizer solution INHALE ONE VIAL IN NEBULIZER EVERY 4 HOURS AS NEEDED FOR WHEEZING.  . BREO ELLIPTA 200-25 MCG/INH AEPB INHALE ONE (1) PUFF(S) BY MOUTH ONCE  DAILY.  . busPIRone (BUSPAR) 10 MG tablet Take 10 mg by mouth 2 (two) times daily.  . butalbital-acetaminophen-caffeine (FIORICET, ESGIC) 50-325-40 MG per tablet TAKE 1 TABLET TWICE DAILY AS NEEDED FOR TENSION HEADACHE.  [name] CRESTOR 10 MG tablet Take 1 tablet (10 mg total) by mouth daily.  [name] DEXILANT 60 MG capsule TAKE 1 CAPSULE BY MOUTH ONCE A DAY.  . diazepam (VALIUM) 5 MG tablet TAKE 1 TABLET EVERY 12 HOURS AS NEEDED FOR ANXIETY.  . DULoxetine (CYMBALTA) 60 MG capsule Take 1 capsule (60 mg total) by mouth at bedtime.  [name] escitalopram (LEXAPRO) 20 MG tablet TAKE (1) TABLET BY MOUTH ONCE DAILY.  . fludrocortisone (FLORINEF) 0.1 MG tablet Take 1 tablet (0.1 mg total) by mouth daily.  . furosemide (LASIX) 40 MG tablet Take 40 mg by mouth daily. Daily as needed  . HYDROcodone-acetaminophen (NORCO) 7.5-325 MG per tablet Take 1 tablet by mouth 3 (three) times daily.  [name] ipratropium (ATROVENT) 0.02 % nebulizer solution INHALE 1 VIAL IN NEBULIZER EVERY 4 HOURS AS NEEDED FOR WHEEZING.  . levETIRAcetam (KEPPRA) 500 MG tablet TAKE (1) TABLET TWICE DAILY.  [name] Loratadine 10 MG CAPS Take 1 capsule (10 mg total) by mouth daily.  . methocarbamol (ROBAXIN) 500 MG tablet Take 500 mg by mouth every 8 (eight) hours as needed.  . midodrine (PROAMATINE) 5 MG tablet TAKE (1/2) TABLET THREE TIMES DAILY.  . nitroGLYCERIN (NITROSTAT) 0.4 MG SL tablet Place 1 tablet (0.4 mg total) under the tongue every 5 (five) minutes as needed for chest pain.  [name] PROAIR HFA 108 (  90 BASE) MCG/ACT inhaler INHALE 2 PUFFS EVERY 6 HOURS AS NEEDED.  . ranitidine (ZANTAC) 150 MG tablet Take 1 tablet (150 mg total) by mouth at bedtime.  . tolterodine (DETROL) 2 MG tablet Take 1 tablet (2 mg total) by mouth 2 (two) times daily.  . traZODone (DESYREL) 150 MG tablet Take 1 tablet by mouth daily.  . valACYclovir (VALTREX) 500 MG tablet TAKE (1) TABLET TWICE DAILY.  . [DISCONTINUED] baclofen (LIORESAL) 10 MG tablet Take 1 tablet (10 mg total) by mouth  3 (three) times daily. (Patient not taking: Reported on 03/12/2015)   No facility-administered encounter medications on file as of 03/12/2015.       Review of Systems  Constitutional: Negative.   HENT: Negative.   Respiratory: Negative.   Cardiovascular: Negative.   Genitourinary: Negative.   Neurological: Negative.   Psychiatric/Behavioral: Negative.   All other systems reviewed and are negative.      Objective:   Physical Exam  Constitutional: She is oriented to person, place, and time. She appears well-developed and well-nourished.  HENT:  Nose: Nose normal.  Mouth/Throat: Oropharynx is clear and moist.  Eyes: EOM are normal.  Neck: Trachea normal, normal range of motion and full passive range of motion without pain. Neck supple. No JVD present. Carotid bruit is not present. No thyromegaly present.  Cardiovascular: Normal rate, regular rhythm, normal heart sounds and intact distal pulses.  Exam reveals no gallop and no friction rub.   No murmur heard. Pulmonary/Chest: Effort normal and breath sounds normal.  Abdominal: Soft. Bowel sounds are normal. She exhibits no distension and no mass. There is no tenderness.  Musculoskeletal: Normal range of motion.  FROM of right knee with crepitus on flexion and extension- slight knee effusion- all ligaments intact  Lymphadenopathy:    She has no cervical adenopathy.  Neurological: She is alert and oriented to person, place, and time. She has normal reflexes.  Skin: Skin is warm and dry.  Psychiatric: She has a normal mood and affect. Her behavior is normal. Judgment and thought content normal.   BP 135/86 mmHg  Pulse 97  Temp(Src) 97.9 F (36.6 C) (Oral)  Ht _0  (1.549 m)  Wt 145 lb (65.772 kg)  BMI 27.41 kg/m2      Assessment & Plan:   1. Depression   2. Gastroesophageal reflux disease with esophagitis   3. Essential hypertension   4. Herpes simplex virus (HSV) infection   5. Hyperlipidemia with target LDL less  than 100   6. Anxiety state   7. Insomnia   8. Urinary urgency   9. Seizures ([place])    Meds ordered this encounter  Medications  . DULoxetine (CYMBALTA) 60 MG capsule    Sig: Take 1 capsule (60 mg total) by mouth at bedtime.    Dispense:  30 capsule    Refill:  5    Order Specific Question:  Supervising Provider    Answer:  Chipper Herb [[id number]]  . ranitidine (ZANTAC) 150 MG tablet    Sig: Take 1 tablet (150 mg total) by mouth at bedtime.    Dispense:  30 tablet    Refill:  5    Order Specific Question:  Supervising Provider    Answer:  Chipper Herb [[id number]]  . valACYclovir (VALTREX) 500 MG tablet    Sig: TAKE (1) TABLET TWICE DAILY.    Dispense:  60 tablet    Refill:  5    Order Specific Question:  Supervising Provider  Answer:  Chipper Herb [[id number]]  . traZODone (DESYREL) 150 MG tablet    Sig: Take 1 tablet (150 mg total) by mouth daily.    Dispense:  30 tablet    Refill:  5    Order Specific Question:  Supervising Provider    Answer:  Chipper Herb [[id number]]  . tolterodine (DETROL) 2 MG tablet    Sig: Take 1 tablet (2 mg total) by mouth 2 (two) times daily.    Dispense:  60 tablet    Refill:  5    Order Specific Question:  Supervising Provider    Answer:  Chipper Herb [[id number]]  . levETIRAcetam (KEPPRA) 500 MG tablet    Sig: TAKE (1) TABLET TWICE DAILY.    Dispense:  60 tablet    Refill:  5    Please schedule regular Office Visit.  Thank you.    Order Specific Question:  Supervising Provider    Answer:  Chipper Herb [[id number]]  . furosemide (LASIX) 40 MG tablet    Sig: Take 1 tablet (40 mg total) by mouth daily. Daily as needed    Dispense:  30 tablet    Refill:  5    Order Specific Question:  Supervising Provider    Answer:  Chipper Herb [[id number]]  . escitalopram (LEXAPRO) 20 MG tablet    Sig: TAKE (1) TABLET BY MOUTH ONCE DAILY.    Dispense:  30 tablet    Refill:  1    Order Specific Question:  Supervising Provider    Answer:  Chipper Herb [[id number]]  .  dexlansoprazole (DEXILANT) 60 MG capsule    Sig: TAKE 1 CAPSULE BY MOUTH ONCE A DAY.    Dispense:  30 capsule    Refill:  5    Order Specific Question:  Supervising Provider    Answer:  Chipper Herb [[id number]]  . CRESTOR 10 MG tablet    Sig: Take 1 tablet (10 mg total) by mouth daily.    Dispense:  30 tablet    Refill:  5    Order Specific Question:  Supervising Provider    Answer:  Chipper Herb [[id number]]   Orders Placed This Encounter  Procedures  . CMP14+EGFR  . Lipid panel   Labs pending Low fat diet and exercise Follow up in 3 months  [name] Hassell Done, FNP

## 2015-03-13 ENCOUNTER — Other Ambulatory Visit: Payer: Self-pay

## 2015-03-13 DIAGNOSIS — M47812 Spondylosis without myelopathy or radiculopathy, cervical region: Secondary | ICD-10-CM

## 2015-03-13 DIAGNOSIS — M47817 Spondylosis without myelopathy or radiculopathy, lumbosacral region: Secondary | ICD-10-CM

## 2015-03-13 DIAGNOSIS — F411 Generalized anxiety disorder: Secondary | ICD-10-CM

## 2015-03-13 LAB — CMP14+EGFR
A/G RATIO: 1.6 (ref 1.1–2.5)
ALK PHOS: 105 IU/L (ref 39–117)
ALT: 14 IU/L (ref 0–32)
AST: 22 IU/L (ref 0–40)
Albumin: 4.4 g/dL (ref 3.5–5.5)
BUN/Creatinine Ratio: 13 (ref 9–23)
BUN: 11 mg/dL (ref 6–24)
Bilirubin Total: <0.2 mg/dL (ref 0.0–1.2)
CO2: 22 mmol/L (ref 18–29)
Calcium: 10.5 mg/dL — ABNORMAL HIGH (ref 8.7–10.2)
Chloride: 100 mmol/L (ref 96–106)
Creatinine, Ser: 0.84 mg/dL (ref 0.57–1.00)
GFR calc Af Amer: 89 mL/min/{1.73_m2} (ref >59)
GFR calc non Af Amer: 77 mL/min/{1.73_m2} (ref >59)
GLOBULIN, TOTAL: 2.7 g/dL (ref 1.5–4.5)
Glucose: 162 mg/dL — ABNORMAL HIGH (ref 65–99)
POTASSIUM: 4.8 mmol/L (ref 3.5–5.2)
SODIUM: 144 mmol/L (ref 134–144)
Total Protein: 7.1 g/dL (ref 6.0–8.5)

## 2015-03-13 LAB — LIPID PANEL
CHOLESTEROL TOTAL: 215 mg/dL — AB (ref 100–199)
Chol/HDL Ratio: 3.8 ratio units (ref 0.0–4.4)
HDL: 56 mg/dL (ref >39)
LDL Calculated: 130 mg/dL — ABNORMAL HIGH (ref 0–99)
TRIGLYCERIDES: 145 mg/dL (ref 0–149)
VLDL Cholesterol Cal: 29 mg/dL (ref 5–40)

## 2015-03-13 MED ORDER — DIAZEPAM 5 MG PO TABS: ORAL_TABLET | ORAL | Status: DC

## 2015-03-13 MED ORDER — HYDROCODONE-ACETAMINOPHEN 7.5-325 MG PO TABS: 1.0000 | ORAL_TABLET | Freq: Three times a day (TID) | ORAL | Status: DC

## 2015-03-13 NOTE — Telephone Encounter (Signed)
rx ready for pickup 

## 2015-03-14 NOTE — Telephone Encounter (Signed)
Aware, scripts ready. 

## 2015-03-29 DIAGNOSIS — Z951 Presence of aortocoronary bypass graft: Secondary | ICD-10-CM | POA: Diagnosis not present

## 2015-03-29 DIAGNOSIS — E7800 Pure hypercholesterolemia, unspecified: Secondary | ICD-10-CM | POA: Diagnosis not present

## 2015-03-29 DIAGNOSIS — Z79899 Other long term (current) drug therapy: Secondary | ICD-10-CM | POA: Diagnosis not present

## 2015-03-29 DIAGNOSIS — K219 Gastro-esophageal reflux disease without esophagitis: Secondary | ICD-10-CM | POA: Diagnosis not present

## 2015-03-29 DIAGNOSIS — I252 Old myocardial infarction: Secondary | ICD-10-CM | POA: Diagnosis not present

## 2015-03-29 DIAGNOSIS — J45909 Unspecified asthma, uncomplicated: Secondary | ICD-10-CM | POA: Diagnosis not present

## 2015-03-29 DIAGNOSIS — Z95810 Presence of automatic (implantable) cardiac defibrillator: Secondary | ICD-10-CM | POA: Diagnosis not present

## 2015-03-29 DIAGNOSIS — J020 Streptococcal pharyngitis: Secondary | ICD-10-CM | POA: Diagnosis not present

## 2015-04-04 ENCOUNTER — Telehealth: Payer: Self-pay | Admitting: Nurse Practitioner

## 2015-04-04 NOTE — Telephone Encounter (Signed)
Stp and given appt with MMM tomorrow morning at 8:45.

## 2015-04-05 ENCOUNTER — Ambulatory Visit (INDEPENDENT_AMBULATORY_CARE_PROVIDER_SITE_OTHER): Payer: Medicare Other | Admitting: Nurse Practitioner

## 2015-04-05 ENCOUNTER — Encounter: Payer: Self-pay | Admitting: Nurse Practitioner

## 2015-04-05 VITALS — BP 126/74 | HR 72 | Temp 97.7°F | Ht 61.0 in | Wt 144.0 lb

## 2015-04-05 DIAGNOSIS — L03115 Cellulitis of right lower limb: Secondary | ICD-10-CM | POA: Diagnosis not present

## 2015-04-05 MED ORDER — BREO ELLIPTA 200-25 MCG/INH IN AEPB: 1.0000 | INHALATION_SPRAY | Freq: Every day | RESPIRATORY_TRACT | Status: DC

## 2015-04-05 MED ORDER — CEPHALEXIN 500 MG PO CAPS: 500.0000 mg | ORAL_CAPSULE | Freq: Four times a day (QID) | ORAL | Status: DC

## 2015-04-05 NOTE — Progress Notes (Signed)
   Subjective:    Patient ID: [name], female    DOB: Apr 20, 1955, 60 y.o.   MRN: [id number]  HPI  Patient in complaining that she was attacked by a rooster. It clawed her right lower leg. Sore to touch.   Review of Systems  Constitutional: Negative.   HENT: Negative.   Respiratory: Negative.   Cardiovascular: Negative.   Genitourinary: Negative.   Neurological: Negative.   Psychiatric/Behavioral: Negative.   All other systems reviewed and are negative.      Objective:   Physical Exam  Constitutional: She is oriented to person, place, and time. She appears well-developed and well-nourished.  Cardiovascular: Normal rate, regular rhythm and normal heart sounds.   Pulmonary/Chest: Effort normal and breath sounds normal.  Neurological: She is alert and oriented to person, place, and time.  Skin: Skin is warm.  2cm erythematous annular tender lesion on right lateral lower leg just below knee.  Psychiatric: She has a normal mood and affect. Her behavior is normal. Judgment and thought content normal.    BP 126/74 mmHg  Pulse 72  Temp(Src) 97.7 F (36.5 C) (Oral)  Ht 5\' 1"  (1.549 m)  Wt 144 lb (65.318 kg)  BMI 27.22 kg/m2       Assessment & Plan:  1. Cellulitis of leg, right Clean daily with antibacterial soap RTO prn - cephALEXin (KEFLEX) 500 MG capsule; Take 1 capsule (500 mg total) by mouth 4 (four) times daily.  Dispense: 30 capsule; Refill: 0  [name] , FNP

## 2015-04-05 NOTE — Addendum Note (Signed)
Addended by: Bennie Pierini on: 04/05/2015 09:30 AM   Modules accepted: Orders

## 2015-04-09 ENCOUNTER — Telehealth: Payer: Self-pay | Admitting: Nurse Practitioner

## 2015-04-09 MED ORDER — FLUCONAZOLE 150 MG PO TABS: 150.0000 mg | ORAL_TABLET | Freq: Once | ORAL | Status: DC

## 2015-04-09 NOTE — Telephone Encounter (Signed)
Patient aware.

## 2015-04-09 NOTE — Telephone Encounter (Signed)
Diflucan rx sent to pharmacy.

## 2015-04-11 ENCOUNTER — Other Ambulatory Visit: Payer: Self-pay | Admitting: Nurse Practitioner

## 2015-04-11 NOTE — Telephone Encounter (Signed)
Last seen 04/05/15 MMM  If approved print

## 2015-04-12 NOTE — Telephone Encounter (Signed)
rx ready for pick up May need to see pain management if going to continue taking on regilar basis

## 2015-04-12 NOTE — Telephone Encounter (Signed)
Pt aware written Rx is at front desk ready for pick up as well as needing to be seen here or by pain management if needing to continue on the Norco

## 2015-04-23 ENCOUNTER — Ambulatory Visit (INDEPENDENT_AMBULATORY_CARE_PROVIDER_SITE_OTHER): Payer: Medicare Other | Admitting: Family Medicine

## 2015-04-23 ENCOUNTER — Encounter: Payer: Self-pay | Admitting: Family Medicine

## 2015-04-23 VITALS — BP 133/90 | HR 77 | Temp 97.6°F | Ht 61.0 in | Wt 150.4 lb

## 2015-04-23 DIAGNOSIS — M7522 Bicipital tendinitis, left shoulder: Secondary | ICD-10-CM | POA: Diagnosis not present

## 2015-04-23 MED ORDER — ACYCLOVIR 5 % EX OINT: 1.0000 "application " | TOPICAL_OINTMENT | CUTANEOUS | Status: DC

## 2015-04-23 MED ORDER — METHYLPREDNISOLONE ACETATE 40 MG/ML IJ SUSP
40.0000 mg | Freq: Once | INTRAMUSCULAR | Status: AC
  Administered 2015-04-23: 40 mg via INTRA_ARTICULAR

## 2015-04-23 NOTE — Progress Notes (Signed)
BP 133/90 mmHg  Pulse 77  Temp(Src) 97.6 F (36.4 C) (Oral)  Ht 5\' 1"  (1.549 m)  Wt 150 lb 6.4 oz (68.221 kg)  BMI 28.43 kg/m2   Subjective:    Patient ID: [name], female    DOB: September 20, 1955, 60 y.o.   MRN: [id number]  HPI: [name] is a 61 y.o. female presenting on 04/23/2015 for Shoulder Pain   HPI Left shoulder pain Patient has been having anterior left shoulder pain for the past 2 weeks.She feels like she brought on by doing a lot of painting with her daughter. She was doing a lot of repetitive movements with that arm and says that it is been worsening over the past 2 weeks. She has been taking her hydrocodone more frequently than she ever has before. She is also been using her Flexeril without much success. The pain is anterior along with the bicipital tendon groove would be and is worse with any movement or rotation of the shoulder. She rates the pain as 10 out of 10.  Relevant past medical, surgical, family and social history reviewed and updated as indicated. Interim medical history since our last visit reviewed. Allergies and medications reviewed and updated.  Review of Systems  Constitutional: Negative for fever and chills.  HENT: Negative for congestion, ear discharge and ear pain.   Eyes: Negative for redness and visual disturbance.  Respiratory: Negative for chest tightness and shortness of breath.   Cardiovascular: Negative for chest pain and leg swelling.  Genitourinary: Negative for dysuria and difficulty urinating.  Musculoskeletal: Positive for arthralgias. Negative for back pain, joint swelling and gait problem.  Skin: Negative for color change and rash.  Neurological: Negative for light-headedness and headaches.  Psychiatric/Behavioral: Negative for behavioral problems and agitation.  All other systems reviewed and are negative.   Per HPI unless specifically indicated above     Medication List       This list is accurate as of:  04/23/15  9:49 AM.  Always use your most recent med list.               acyclovir ointment 5 %  Commonly known as:  ZOVIRAX  Apply 1 application topically every 3 (three) hours.     BREO ELLIPTA 200-25 MCG/INH Aepb  Generic drug:  Fluticasone Furoate-Vilanterol  Take 1 puff by mouth daily.     busPIRone 10 MG tablet  Commonly known as:  BUSPAR  Take 10 mg by mouth 2 (two) times daily.     butalbital-acetaminophen-caffeine 50-325-40 MG tablet  Commonly known as:  FIORICET, ESGIC  TAKE 1 TABLET TWICE DAILY AS NEEDED FOR TENSION HEADACHE.     CRESTOR 10 MG tablet  Generic drug:  rosuvastatin  Take 1 tablet (10 mg total) by mouth daily.     dexlansoprazole 60 MG capsule  Commonly known as:  DEXILANT  TAKE 1 CAPSULE BY MOUTH ONCE A DAY.     diazepam 5 MG tablet  Commonly known as:  VALIUM  TAKE 1 TABLET EVERY 12 HOURS AS NEEDED FOR ANXIETY.     DULoxetine 60 MG capsule  Commonly known as:  CYMBALTA  Take 1 capsule (60 mg total) by mouth at bedtime.     escitalopram 20 MG tablet  Commonly known as:  LEXAPRO  TAKE (1) TABLET BY MOUTH ONCE DAILY.     escitalopram 20 MG tablet  Commonly known as:  LEXAPRO  TAKE 1 TABLET ONCE DAILY.     fluconazole  150 MG tablet  Commonly known as:  DIFLUCAN  Take 1 tablet (150 mg total) by mouth once.     fludrocortisone 0.1 MG tablet  Commonly known as:  FLORINEF  Take 1 tablet (0.1 mg total) by mouth daily.     furosemide 40 MG tablet  Commonly known as:  LASIX  Take 1 tablet (40 mg total) by mouth daily. Daily as needed     HYDROcodone-acetaminophen 7.5-325 MG tablet  Commonly known as:  NORCO  TAKE 1 TABLET BY MOUTH 3 TIMES DAILY.     ipratropium 0.02 % nebulizer solution  Commonly known as:  ATROVENT  INHALE 1 VIAL IN NEBULIZER EVERY 4 HOURS AS NEEDED FOR WHEEZING.     levETIRAcetam 500 MG tablet  Commonly known as:  KEPPRA  TAKE (1) TABLET TWICE DAILY.     Loratadine 10 MG Caps  Take 1 capsule (10 mg total) by mouth  daily.     methocarbamol 500 MG tablet  Commonly known as:  ROBAXIN  Take 500 mg by mouth every 8 (eight) hours as needed.     midodrine 5 MG tablet  Commonly known as:  PROAMATINE  TAKE (1/2) TABLET THREE TIMES DAILY.     nitroGLYCERIN 0.4 MG SL tablet  Commonly known as:  NITROSTAT  Place 1 tablet (0.4 mg total) under the tongue every 5 (five) minutes as needed for chest pain.     PROAIR HFA 108 (90 Base) MCG/ACT inhaler  Generic drug:  albuterol  INHALE 2 PUFFS EVERY 6 HOURS AS NEEDED.     albuterol (2.5 MG/3ML) 0.083% nebulizer solution  Commonly known as:  PROVENTIL  INHALE ONE VIAL IN NEBULIZER EVERY 4 HOURS AS NEEDED FOR WHEEZING.     ranitidine 150 MG tablet  Commonly known as:  ZANTAC  Take 1 tablet (150 mg total) by mouth at bedtime.     tolterodine 2 MG tablet  Commonly known as:  DETROL  Take 1 tablet (2 mg total) by mouth 2 (two) times daily.     traZODone 150 MG tablet  Commonly known as:  DESYREL  Take 1 tablet (150 mg total) by mouth daily.     valACYclovir 500 MG tablet  Commonly known as:  VALTREX  TAKE (1) TABLET TWICE DAILY.           Objective:    BP 133/90 mmHg  Pulse 77  Temp(Src) 97.6 F (36.4 C) (Oral)  Ht  (1.549 m)  Wt 150 lb 6.4 oz (68.221 kg)  BMI 28.43 kg/m2  Wt Readings from Last 3 Encounters:  04/23/15 150 lb 6.4 oz (68.221 kg)  04/05/15 144 lb (65.318 kg)  03/12/15 145 lb (65.772 kg)    Physical Exam  Constitutional: She is oriented to person, place, and time. She appears well-developed and well-nourished. No distress.  Eyes: Conjunctivae and EOM are normal. Pupils are equal, round, and reactive to light.  Cardiovascular: Normal rate, regular rhythm, normal heart sounds and intact distal pulses.   No murmur heard. Pulmonary/Chest: Effort normal and breath sounds normal. No respiratory distress. She has no wheezes.  Musculoskeletal: She exhibits no edema.       Left shoulder: She exhibits decreased range of motion  (Limited by pain) and tenderness (The worst pain is over the anterior bicipital groove). She exhibits no bony tenderness, no swelling, no effusion, no crepitus, no deformity and normal strength.  Neurological: She is alert and oriented to person, place, and time. Coordination normal.  Skin: Skin  is warm and dry. No rash noted. She is not diaphoretic.  Psychiatric: She has a normal mood and affect. Her behavior is normal.  Nursing note and vitals reviewed.  Bicipital tendon injection: Risk factors of bleeding and infection discussed with patient and patient is agreeable towards injection. Patient prepped with Betadine. Injected around tendon anteriorly. Injected 40 mg of Depo-Medrol and 1 mL of 2% lidocaine. Patient tolerated procedure well and no side effects from noted. Minimal to no bleeding. Simple bandage applied after.     Assessment & Plan:   Problem List Items Addressed This Visit    None    Visit Diagnoses    Bicipital tendinitis of left shoulder    -  Primary    Relevant Medications    methylPREDNISolone acetate (DEPO-MEDROL) injection 40 mg (Start on 04/23/2015 10:00 AM)        Follow up plan: Return if symptoms worsen or fail to improve.  Counseling provided for all of the vaccine components No orders of the defined types were placed in this encounter.    Arville Care, MD [facility] 04/23/2015, 9:49 AM

## 2015-04-26 ENCOUNTER — Telehealth: Payer: Self-pay

## 2015-04-26 NOTE — Telephone Encounter (Signed)
x

## 2015-04-30 ENCOUNTER — Telehealth: Payer: Self-pay | Admitting: Nurse Practitioner

## 2015-04-30 DIAGNOSIS — M25519 Pain in unspecified shoulder: Secondary | ICD-10-CM

## 2015-05-01 ENCOUNTER — Other Ambulatory Visit: Payer: Self-pay | Admitting: Nurse Practitioner

## 2015-05-01 NOTE — Telephone Encounter (Signed)
Last seen 04/23/15  Dr Dettinger  If approved print

## 2015-05-01 NOTE — Telephone Encounter (Signed)
I usually only recommend injections about every 3 months, so it is too soon for another injection, it appears that the medication was filled once for a part month by [name] earlier this month. She will have to discuss refills with [name]. Before referral going to pain management I would consider sending her to orthopedic first, if she wants to go see orthopedic then go ahead and send her there. Arville Care, MD [facility] 05/01/2015, 2:22 PM

## 2015-05-01 NOTE — Telephone Encounter (Signed)
To soon to have a nothe rjoint injection have to wait 3 months between injections

## 2015-05-01 NOTE — Telephone Encounter (Signed)
[name],   1- seen Dr Louanne Skye 1/23- (you were not available ) he injected shoulder with depo med 40 mg. She states the pain has not changed - wonders if you can do the injection??  2-wants referral to pain mgmt  3-needs a rf on pain meds until referral

## 2015-05-01 NOTE — Telephone Encounter (Signed)
This was to be forwarded to Lurena Joiner, MD [facility] 05/01/2015, 2:39 PM

## 2015-05-01 NOTE — Telephone Encounter (Signed)
MMM please address the referral and Refill on pain med - before we call the pt

## 2015-05-02 MED ORDER — HYDROCODONE-ACETAMINOPHEN 7.5-325 MG PO TABS: ORAL_TABLET | ORAL | Status: DC

## 2015-05-02 NOTE — Telephone Encounter (Signed)
Referral made to ortho and pain med ready to be picked up

## 2015-05-02 NOTE — Telephone Encounter (Signed)
Patient aware.

## 2015-05-07 ENCOUNTER — Other Ambulatory Visit: Payer: Self-pay | Admitting: Internal Medicine

## 2015-05-10 ENCOUNTER — Other Ambulatory Visit: Payer: Self-pay | Admitting: Nurse Practitioner

## 2015-05-21 ENCOUNTER — Encounter: Payer: Self-pay | Admitting: Nurse Practitioner

## 2015-05-21 ENCOUNTER — Ambulatory Visit (INDEPENDENT_AMBULATORY_CARE_PROVIDER_SITE_OTHER): Payer: Medicare Other | Admitting: Nurse Practitioner

## 2015-05-21 VITALS — BP 133/79 | HR 76 | Temp 97.8°F | Ht 61.0 in | Wt 154.0 lb

## 2015-05-21 DIAGNOSIS — R7309 Other abnormal glucose: Secondary | ICD-10-CM | POA: Diagnosis not present

## 2015-05-21 DIAGNOSIS — E8881 Metabolic syndrome: Secondary | ICD-10-CM

## 2015-05-21 DIAGNOSIS — K2100 Gastro-esophageal reflux disease with esophagitis, without bleeding: Secondary | ICD-10-CM

## 2015-05-21 DIAGNOSIS — R739 Hyperglycemia, unspecified: Secondary | ICD-10-CM

## 2015-05-21 LAB — POCT GLYCOSYLATED HEMOGLOBIN (HGB A1C): HEMOGLOBIN A1C: 6.1

## 2015-05-21 NOTE — Progress Notes (Signed)
   Subjective:    Patient ID: [name], female    DOB: 1955/08/05, 60 y.o.   MRN: [id number]  HPI Patient in today c/o left shoulder pain- she saw ortho and he was injecting her shoulder -he had done some blood work which showed elevated blood sugar- SHe has an appointment with Dr. Otelia Sergeant next week.  Her blood sugars have been elevated occasionally when she has las doen. She says that she does not check blood sugars at home because not actually dx with diabetes. She says she feels fine.    Review of Systems  Constitutional: Negative.   HENT: Negative.   Respiratory: Negative.   Cardiovascular: Negative.   Genitourinary: Negative.   Neurological: Negative.   Psychiatric/Behavioral: Negative.   All other systems reviewed and are negative.      Objective:   Physical Exam  Constitutional: She is oriented to person, place, and time. She appears well-developed and well-nourished.  Cardiovascular: Normal rate, regular rhythm and normal heart sounds.   Pulmonary/Chest: Effort normal and breath sounds normal.  Neurological: She is alert and oriented to person, place, and time.  Skin: Skin is warm.  Psychiatric: She has a normal mood and affect. Her behavior is normal. Judgment and thought content normal.   BP 133/79 mmHg  Pulse 76  Temp(Src) 97.8 F (36.6 C) (Oral)  Ht 5\' 1"  (1.549 m)  Wt 154 lb (69.854 kg)  BMI 29.11 kg/m2  Results for orders placed or performed in visit on 05/21/15  POCT glycosylated hemoglobin (Hb A1C)  Result Value Ref Range   Hemoglobin A1C 6.1        Assessment & Plan:  1. Elevated blood sugar - POCT glycosylated hemoglobin (Hb A1C)  2. Metabolic syndrome Watch carbs in diet Exercise We will keep check on blood sugars No meds at this time  [name] 05/23/15, FNP

## 2015-05-21 NOTE — Patient Instructions (Signed)
Metabolic Syndrome Metabolic syndrome is the presence of at least three factors that increase your risk of getting cardiovascular disease and diabetes. These factors are:  High blood sugar.  High blood triglyceride level.  High blood pressure.  Low levels of good blood cholesterol (high-density lipoprotein or HDL).  Excess weight around the waist. This factor is present with a waist measurement of:  More than 40 inches in men.  More than 35 inches in women. Metabolic syndrome is sometimes called insulin resistance syndrome and syndrome X. CAUSES The exact cause is not known, but genetics and lifestyle choices play a role. RISK FACTORS You are more likely to develop metabolic syndrome if:  You eat a diet high in calories and saturated fat.  You do not exercise regularly.  You are overweight.  You have a family history of metabolic syndrome.  You are Asian.  You are older in age.  You have insulin resistance.  You use any tobacco products, including cigarettes, chewing tobacco, or electronic cigarettes. SIGNS AND SYMPTOMS Metabolic syndrome has no specific symptoms. DIAGNOSIS To make a diagnosis, your health care provider will determine whether you have at least three of the factors that make up metabolic syndrome by:  Taking your blood pressure.  Measuring your waist.  Ordering blood tests. TREATMENT Treatment may include:  Lifestyle changes to reduce your risk for heart disease and stroke, such as:  Exercise.  Weight loss.  Maintaining a healthy diet.  Quitting the use of any tobacco products, including cigarettes, chewing tobacco, or electronic cigarettes.  Medicines that:  Help your body to maintain glucose control.  Reduce your blood pressure and your blood triglyceride levels. HOME CARE INSTRUCTIONS  Exercise regularly.  Maintain a healthy diet.  Do not use any tobacco products, including cigarettes, chewing tobacco, or electronic cigarettes.  If you need help quitting, ask your health care provider.  Keep all follow-up visits as directed by your health care provider. This is important.  Measure your waist regularly and record the measurement. To measure your waist:  Stand up straight.  Breathe out.  Wrap the measuring tape around the part of your waist that is just above your hipbones.  Read the measurement. SEEK MEDICAL CARE IF:  You feel very tired.  You develop excessive thirst.  You pass large quantities of urine.  You put on weight around your waist.  You have headaches over and over again.  You have a dizzy spell. SEEK IMMEDIATE MEDICAL CARE IF:  You develop sudden blurred vision.  You develop a sudden dizzy spell.  You have sudden trouble speaking or swallowing.  You have sudden weakness in your arm or leg.  You have chest pains or trouble breathing.  You feel like your heartbeat is abnormal.  You faint.   This information is not intended to replace advice given to you by your health care provider. Make sure you discuss any questions you have with your health care provider.   Document Released: 06/24/2007 Document Revised: 04/07/2014 Document Reviewed: 10/21/2013 Elsevier Interactive Patient Education [employer]

## 2015-05-22 ENCOUNTER — Encounter: Payer: Self-pay | Admitting: Gastroenterology

## 2015-06-05 ENCOUNTER — Ambulatory Visit (INDEPENDENT_AMBULATORY_CARE_PROVIDER_SITE_OTHER): Payer: Medicare Other | Admitting: Nurse Practitioner

## 2015-06-05 ENCOUNTER — Encounter: Payer: Self-pay | Admitting: Nurse Practitioner

## 2015-06-05 VITALS — BP 134/91 | HR 74 | Temp 97.4°F | Ht 61.0 in | Wt 149.0 lb

## 2015-06-05 DIAGNOSIS — Z7189 Other specified counseling: Secondary | ICD-10-CM

## 2015-06-05 DIAGNOSIS — M47812 Spondylosis without myelopathy or radiculopathy, cervical region: Secondary | ICD-10-CM

## 2015-06-05 DIAGNOSIS — G8929 Other chronic pain: Secondary | ICD-10-CM

## 2015-06-05 DIAGNOSIS — Z79891 Long term (current) use of opiate analgesic: Secondary | ICD-10-CM | POA: Diagnosis not present

## 2015-06-05 MED ORDER — HYDROCODONE-ACETAMINOPHEN 7.5-325 MG PO TABS: ORAL_TABLET | ORAL | Status: DC

## 2015-06-05 MED ORDER — HYDROCODONE-ACETAMINOPHEN 7.5-325 MG PO TABS: 1.0000 | ORAL_TABLET | Freq: Four times a day (QID) | ORAL | Status: DC | PRN

## 2015-06-05 NOTE — Progress Notes (Signed)
Subjective:    Patient ID: [name], female    DOB: 06/19/1955, 60 y.o.   MRN: [id number]  HPI  [facility] Controlled Substance Abuse database reviewed- Yes  Depression screen [facility] 2/9 06/05/2015 04/23/2015 07/24/2014 05/12/2014 12/31/2012  Decreased Interest 2 0 0 2 1  Down, Depressed, Hopeless 1 0 0 1 1  PHQ - 2 Score 3 0 0 3 2  Altered sleeping 3 - - 3 1  Tired, decreased energy 3 - - 3 2  Change in appetite 0 - - 1 1  Feeling bad or failure about yourself  1 - - 0 1  Trouble concentrating 3 - - 3 1  Moving slowly or fidgety/restless 3 - - 2 2  Suicidal thoughts 0 - - 0 2  PHQ-9 Score 16 - - 15 12    GAD 7 : Generalized Anxiety Score 06/05/2015  Nervous, Anxious, on Edge 3  Control/stop worrying 3  Worry too much - different things 3  Trouble relaxing 3  Restless 3  Easily annoyed or irritable 3  Afraid - awful might happen 0  Total GAD 7 Score 18       [employer] drug screen performed- Yes  SOAPP  0= never  1= seldom  2=sometimes  3= often  4= very often  How often do you have mood swings? 3 How often do you smoke a cigarette within an hour after waling up? 0 How often have you taken medication other than the way that it was prescribed?0 How often have you used illegal drugs in the past 5 years? 0 How often, in your lifetime, have you had legal problems or been arrested? 0  Score 3  Alcohol Audit - How often during the last year have found that you: 0-Never   1- Less than monthly   2- Monthly     3-Weekly     4-daily or almost daily  - found that you were not able to stop drinking once you started- 0 -failed to do what was normally expected of you because of drinking- 0 -needed a first drink in the morning- 0 -had a feeling of guilt or remorse after drinking- 0 -are/were unable to remember what happened the night before because of your drinking- 0  0- NO   2- yes but not in last year  4- yes during last year -Have you or someone else been injured  because of your drinking- 0 - Has anyone been concerned about your drinking or suggested you cut down- 0        TOTAL- 0  ( 0-7- alcohol education, 8-15- simple advice, 16-19 simple advice plus counseling, 20-40 referral for evaluation and treatment 0   Designated Pharmacy- [facility]  Pain assessment: Cause of pain- Spondylosis and DDD ( Dr. Otelia Sergeant) Pain location- up and down bacK- has had surgery on lumbar spine and cervical spine Pain on scale of 1-10- 3-10/10 Frequency- daily What increases pain-movement and not having pain meds What makes pain Better-pain meds is the only thing that helps Effects on ADL - makes it difficult to perform ADL's- takes her  Longer due to pain  Prior treatments tried and failed- PT, injections and several surgeries Current treatments- norco 7.5/325 TID but tries not to take 3x a day if she can help it. Morphine mg equivalent- 5  Pain management agreement reviewed and signed- Yes      Review of Systems  Musculoskeletal: Positive for back pain, joint swelling, neck pain  and neck stiffness.  All other systems reviewed and are negative.      Objective:   Physical Exam  Constitutional: She is oriented to person, place, and time. She appears well-developed and well-nourished. No distress.  Cardiovascular: Normal rate and regular rhythm.   Pulmonary/Chest: Effort normal and breath sounds normal.  Musculoskeletal:  Decrease ROM of lumber spine and neck due to pain- pain is worse with flexion and extension. Grips equal bil Low ext motor strength and sensation distally intact  Neurological: She is alert and oriented to person, place, and time. No cranial nerve deficit.  Skin: Skin is warm.  Psychiatric: She has a normal mood and affect. Her behavior is normal. Judgment and thought content normal.    BP 134/91 mmHg  Pulse 74  Temp(Src) 97.4 F (36.3 C) (Oral)  Ht 5\' 1"  (1.549 m)  Wt 149 lb (67.586 kg)  BMI 28.17 kg/m2         Assessment & Plan:  1. Cervical spondylosis without myelopathy - HYDROcodone-acetaminophen (NORCO) 7.5-325 MG tablet; TAKE 1 TABLET BY MOUTH 3 TIMES DAILY.  Dispense: 75 tablet; Refill: 0 - HYDROcodone-acetaminophen (NORCO) 7.5-325 MG tablet; Take 1 tablet by mouth every 6 (six) hours as needed for moderate pain.  Dispense: 75 tablet; Refill: 0 - HYDROcodone-acetaminophen (NORCO) 7.5-325 MG tablet; Take 1 tablet by mouth every 6 (six) hours as needed for moderate pain.  Dispense: 75 tablet; Refill: 0  2. Encounter for chronic pain management Discussed new pain management policy Contract signed - [employer] Select 13 (MW), Urine  [name] 07-29-1976, FNP

## 2015-06-10 LAB — TOXASSURE SELECT 13 (MW), URINE: PDF: 0

## 2015-06-26 ENCOUNTER — Ambulatory Visit: Payer: Medicare Other | Admitting: Gastroenterology

## 2015-07-23 ENCOUNTER — Other Ambulatory Visit: Payer: Self-pay | Admitting: Nurse Practitioner

## 2015-07-23 MED ORDER — TOBRAMYCIN 0.3 % OP SOLN: 2.0000 [drp] | OPHTHALMIC | Status: DC

## 2015-07-23 NOTE — Telephone Encounter (Signed)
Eye drops rx sent to pharmacy

## 2015-07-24 NOTE — Telephone Encounter (Signed)
Patient notified via voicemail that prescription sent to [facility]

## 2015-08-01 ENCOUNTER — Other Ambulatory Visit: Payer: Self-pay | Admitting: Nurse Practitioner

## 2015-08-01 NOTE — Telephone Encounter (Signed)
Diazepam last filled 07/09/2015

## 2015-08-01 NOTE — Telephone Encounter (Signed)
RX for Valium called into [facility]  Okayed per MMM

## 2015-08-01 NOTE — Telephone Encounter (Signed)
Please call in diazepam with   1 refills 

## 2015-08-06 ENCOUNTER — Telehealth: Payer: Self-pay | Admitting: Nurse Practitioner

## 2015-08-06 MED ORDER — BUSPIRONE HCL 10 MG PO TABS: 10.0000 mg | ORAL_TABLET | Freq: Two times a day (BID) | ORAL | Status: DC

## 2015-08-24 ENCOUNTER — Ambulatory Visit (INDEPENDENT_AMBULATORY_CARE_PROVIDER_SITE_OTHER): Payer: Medicare Other | Admitting: Nurse Practitioner

## 2015-08-24 ENCOUNTER — Encounter: Payer: Self-pay | Admitting: Nurse Practitioner

## 2015-08-24 VITALS — BP 151/91 | HR 86 | Temp 97.7°F | Ht 61.0 in | Wt 150.0 lb

## 2015-08-24 DIAGNOSIS — K5732 Diverticulitis of large intestine without perforation or abscess without bleeding: Secondary | ICD-10-CM

## 2015-08-24 MED ORDER — CIPROFLOXACIN HCL 500 MG PO TABS: 500.0000 mg | ORAL_TABLET | Freq: Two times a day (BID) | ORAL | Status: DC

## 2015-08-24 MED ORDER — METRONIDAZOLE 500 MG PO TABS: 500.0000 mg | ORAL_TABLET | Freq: Two times a day (BID) | ORAL | Status: DC

## 2015-08-24 NOTE — Patient Instructions (Signed)
Diverticulitis °Diverticulitis is inflammation or infection of small pouches in your colon that form when you have a condition called diverticulosis. The pouches in your colon are called diverticula. Your colon, or large intestine, is where water is absorbed and stool is formed. °Complications of diverticulitis can include: °· Bleeding. °· Severe infection. °· Severe pain. °· Perforation of your colon. °· Obstruction of your colon. °CAUSES  °Diverticulitis is caused by bacteria. °Diverticulitis happens when stool becomes trapped in diverticula. This allows bacteria to grow in the diverticula, which can lead to inflammation and infection. °RISK FACTORS °People with diverticulosis are at risk for diverticulitis. Eating a diet that does not include enough fiber from fruits and vegetables may make diverticulitis more likely to develop. °SYMPTOMS  °Symptoms of diverticulitis may include: °· Abdominal pain and tenderness. The pain is normally located on the left side of the abdomen, but may occur in other areas. °· Fever and chills. °· Bloating. °· Cramping. °· Nausea. °· Vomiting. °· Constipation. °· Diarrhea. °· Blood in your stool. °DIAGNOSIS  °Your health care provider will ask you about your medical history and do a physical exam. You may need to have tests done because many medical conditions can cause the same symptoms as diverticulitis. Tests may include: °· Blood tests. °· Urine tests. °· Imaging tests of the abdomen, including X-rays and CT scans. °When your condition is under control, your health care provider may recommend that you have a colonoscopy. A colonoscopy can show how severe your diverticula are and whether something else is causing your symptoms. °TREATMENT  °Most cases of diverticulitis are mild and can be treated at home. Treatment may include: °· Taking over-the-counter pain medicines. °· Following a clear liquid diet. °· Taking antibiotic medicines by mouth for 7-10 days. °More severe cases may  be treated at a hospital. Treatment may include: °· Not eating or drinking. °· Taking prescription pain medicine. °· Receiving antibiotic medicines through an IV tube. °· Receiving fluids and nutrition through an IV tube. °· Surgery. °HOME CARE INSTRUCTIONS  °· Follow your health care provider's instructions carefully. °· Follow a full liquid diet or other diet as directed by your health care provider. After your symptoms improve, your health care provider may tell you to change your diet. He or she may recommend you eat a high-fiber diet. Fruits and vegetables are good sources of fiber. Fiber makes it easier to pass stool. °· Take fiber supplements or probiotics as directed by your health care provider. °· Only take medicines as directed by your health care provider. °· Keep all your follow-up appointments. °SEEK MEDICAL CARE IF:  °· Your pain does not improve. °· You have a hard time eating food. °· Your bowel movements do not return to normal. °SEEK IMMEDIATE MEDICAL CARE IF:  °· Your pain becomes worse. °· Your symptoms do not get better. °· Your symptoms suddenly get worse. °· You have a fever. °· You have repeated vomiting. °· You have bloody or black, tarry stools. °MAKE SURE YOU:  °· Understand these instructions. °· Will watch your condition. °· Will get help right away if you are not doing well or get worse. °  °This information is not intended to replace advice given to you by your health care provider. Make sure you discuss any questions you have with your health care provider. °  °Document Released: 12/25/2004 Document Revised: 03/22/2013 Document Reviewed: 02/09/2013 °Elsevier Interactive Patient Education ©2016 [employer] ° °

## 2015-08-24 NOTE — Progress Notes (Signed)
   Subjective:    Patient ID: [name], female    DOB: 01-08-1956, 60 y.o.   MRN: [id number]  HPI  Patient comes in today c/o lower abdominal pain- started getting nauseous today. She has a history of diverticulitis and pain is exactly like she has when she has a flare up.   Review of Systems  Constitutional: Negative.   HENT: Negative.   Respiratory: Negative.   Cardiovascular: Negative.   Gastrointestinal: Positive for nausea and abdominal pain. Negative for vomiting, diarrhea and constipation.  Genitourinary: Negative.   Neurological: Negative.   Psychiatric/Behavioral: Negative.   All other systems reviewed and are negative.      Objective:   Physical Exam  Constitutional: She is oriented to person, place, and time. She appears well-developed and well-nourished. No distress.  Cardiovascular: Normal rate, regular rhythm and normal heart sounds.   Pulmonary/Chest: Effort normal and breath sounds normal.  Abdominal: Soft. Bowel sounds are normal. She exhibits no distension. There is tenderness (LLQ).  Neurological: She is alert and oriented to person, place, and time.  Skin: Skin is warm.  Psychiatric: She has a normal mood and affect. Her behavior is normal. Judgment and thought content normal.   BP 151/91 mmHg  Pulse 86  Temp(Src) 97.7 F (36.5 C) (Oral)  Ht 5\' 1"  (1.549 m)  Wt 150 lb (68.04 kg)  BMI 28.36 kg/m2     Assessment & Plan:   1. Diverticulitis of colon    Meds ordered this encounter  Medications  . metroNIDAZOLE (FLAGYL) 500 MG tablet    Sig: Take 1 tablet (500 mg total) by mouth 2 (two) times daily.    Dispense:  14 tablet    Refill:  0    Order Specific Question:  Supervising Provider    Answer:  [[id number]]  . ciprofloxacin (CIPRO) 500 MG tablet    Sig: Take 1 tablet (500 mg total) by mouth 2 (two) times daily.    Dispense:  20 tablet    Refill:  0    Order Specific Question:  Supervising Provider    Answer:  Ernestina Penna  [[id number]]   Watch diet RTO prn  [name] 10-22-1996, FNP

## 2015-09-03 ENCOUNTER — Encounter: Payer: Self-pay | Admitting: Nurse Practitioner

## 2015-09-03 ENCOUNTER — Ambulatory Visit (INDEPENDENT_AMBULATORY_CARE_PROVIDER_SITE_OTHER): Payer: Medicare Other | Admitting: Nurse Practitioner

## 2015-09-03 ENCOUNTER — Other Ambulatory Visit: Payer: Self-pay | Admitting: Nurse Practitioner

## 2015-09-03 VITALS — BP 103/71 | HR 78 | Temp 97.1°F | Ht 61.0 in | Wt 148.0 lb

## 2015-09-03 DIAGNOSIS — M47812 Spondylosis without myelopathy or radiculopathy, cervical region: Secondary | ICD-10-CM

## 2015-09-03 DIAGNOSIS — M25561 Pain in right knee: Secondary | ICD-10-CM | POA: Diagnosis not present

## 2015-09-03 MED ORDER — HYDROCODONE-ACETAMINOPHEN 7.5-325 MG PO TABS: 1.0000 | ORAL_TABLET | Freq: Four times a day (QID) | ORAL | Status: DC | PRN

## 2015-09-03 MED ORDER — HYDROCODONE-ACETAMINOPHEN 7.5-325 MG PO TABS: ORAL_TABLET | ORAL | Status: DC

## 2015-09-03 MED ORDER — BETAMETHASONE SOD PHOS & ACET 6 (3-3) MG/ML IJ SUSP
12.0000 mg | Freq: Once | INTRAMUSCULAR | Status: AC
  Administered 2015-09-03: 12 mg via INTRAMUSCULAR

## 2015-09-03 MED ORDER — BUPIVACAINE HCL (PF) 0.5 % IJ SOLN
1.0000 mL | Freq: Once | INTRAMUSCULAR | Status: AC
  Administered 2015-09-03: 1 mL

## 2015-09-03 NOTE — Telephone Encounter (Signed)
Patient seen 09/03/15  MMM

## 2015-09-03 NOTE — Progress Notes (Signed)
   Subjective:    Patient ID: [name], female    DOB: 12/04/1955, 60 y.o.   MRN: [id number]  HPI Pain assessment:Patient has a long history of chronic back pain Cause of pain- DDD Pain location- lower back Pain on scale of 1-10- 10/10 Frequency- What increases pain-daily- beeter some days then others What makes pain Better- pain meds Effects on ADL - somedays unable to do anything due to pain Any change in general medical condition-she is c/o right knee pain today- started yesterday- denies injury  Current medications- norco 7.5/325 Effectiveness of current meds-usually will help Adverse reactions form pain meds-none  Pill count performed-No Urine drug screen- No     Review of Systems  Constitutional: Negative.   Genitourinary: Negative.   Musculoskeletal: Positive for joint swelling (right knee).  Neurological: Negative.   Psychiatric/Behavioral: Negative.   All other systems reviewed and are negative.      Objective:   Physical Exam  Constitutional: She is oriented to person, place, and time. She appears well-developed and well-nourished. No distress.  Cardiovascular: Normal rate, regular rhythm and normal heart sounds.   Pulmonary/Chest: Effort normal and breath sounds normal.  Musculoskeletal:  Decrease ROM of lumbar spine due to pain with flexion and full extension Right knee- mild effusion with pain lateerally on palpation- no patella tenderness-all ligaments intact  Neurological: She is alert and oriented to person, place, and time.  Skin: Skin is warm.  Psychiatric: She has a normal mood and affect. Her behavior is normal. Judgment and thought content normal.   BP 103/71 mmHg  Pulse 78  Temp(Src) 97.1 F (36.2 C) (Oral)  Ht 5\' 1"  (1.549 m)  Wt 148 lb (67.132 kg)  BMI 27.98 kg/m2  Procedure:  Betadine prep to right knee  Marcaine 0.50 plain 1 ml with celestone 76ml injected with 21 g 1 1/2 needle- right knee  Patient tolerated well       Assessment & Plan:  1. Cervical spondylosis without myelopathy Back stretches encouraged - HYDROcodone-acetaminophen (NORCO) 7.5-325 MG tablet; Take 1 tablet by mouth every 6 (six) hours as needed for moderate pain.  Dispense: 75 tablet; Refill: 0 - HYDROcodone-acetaminophen (NORCO) 7.5-325 MG tablet; Take 1 tablet by mouth every 6 (six) hours as needed for moderate pain.  Dispense: 75 tablet; Refill: 0 - HYDROcodone-acetaminophen (NORCO) 7.5-325 MG tablet; TAKE 1 TABLET BY MOUTH 3 TIMES DAILY.  Dispense: 75 tablet; Refill: 0  2. Right knee pain Wrap leg when up walking Elevate when sitting Ice BID - betamethasone acetate-betamethasone sodium phosphate (CELESTONE) injection 12 mg; Inject 2 mLs (12 mg total) into the muscle once. - bupivacaine (MARCAINE) 0.5 % injection 1 mL; 1 mL by Infiltration route once.  [name] 07-29-1976, FNP

## 2015-09-03 NOTE — Patient Instructions (Signed)
Knee Pain  Knee pain is a very common symptom and can have many causes. Knee pain often goes away when you follow your health care provider's instructions for relieving pain and discomfort at home. However, knee pain can develop into a condition that needs treatment. Some conditions may include:   Arthritis caused by wear and tear (osteoarthritis).   Arthritis caused by swelling and irritation (rheumatoid arthritis or gout).   A cyst or growth in your knee.   An infection in your knee joint.   An injury that will not heal.   Damage, swelling, or irritation of the tissues that support your knee (torn ligaments or tendinitis).  If your knee pain continues, additional tests may be ordered to diagnose your condition. Tests may include X-rays or other imaging studies of your knee. You may also need to have fluid removed from your knee. Treatment for ongoing knee pain depends on the cause, but treatment may include:   Medicines to relieve pain or swelling.   Steroid injections in your knee.   Physical therapy.   Surgery.  HOME CARE INSTRUCTIONS   Take medicines only as directed by your health care provider.   Rest your knee and keep it raised (elevated) while you are resting.   Do not do things that cause or worsen pain.   Avoid high-impact activities or exercises, such as running, jumping rope, or doing jumping jacks.   Apply ice to the knee area:    Put ice in a plastic bag.    Place a towel between your skin and the bag.    Leave the ice on for 20 minutes, 2-3 times a day.   Ask your health care provider if you should wear an elastic knee support.   Keep a pillow under your knee when you sleep.   Lose weight if you are overweight. Extra weight can put pressure on your knee.   Do not use any tobacco products, including cigarettes, chewing tobacco, or electronic cigarettes. If you need help quitting, ask your health care provider. Smoking may slow the healing of any bone and joint problems that you may  have.  SEEK MEDICAL CARE IF:   Your knee pain continues, changes, or gets worse.   You have a fever along with knee pain.   Your knee buckles or locks up.   Your knee becomes more swollen.  SEEK IMMEDIATE MEDICAL CARE IF:    Your knee joint feels hot to the touch.   You have chest pain or trouble breathing.     This information is not intended to replace advice given to you by your health care provider. Make sure you discuss any questions you have with your health care provider.     Document Released: 01/12/2007 Document Revised: 04/07/2014 Document Reviewed: 10/31/2013  Elsevier Interactive Patient Education 2016 Elsevier Inc[employer]

## 2015-09-29 ENCOUNTER — Other Ambulatory Visit: Payer: Self-pay | Admitting: Nurse Practitioner

## 2015-10-04 ENCOUNTER — Other Ambulatory Visit: Payer: Self-pay | Admitting: Nurse Practitioner

## 2015-10-04 ENCOUNTER — Telehealth: Payer: Self-pay

## 2015-10-04 NOTE — Telephone Encounter (Signed)
Seen MMM 08/2015.

## 2015-10-04 NOTE — Telephone Encounter (Signed)
[employer] denied [place] for Acyclovir Ointment

## 2015-10-04 NOTE — Telephone Encounter (Signed)
rx ready for pickup 

## 2015-10-04 NOTE — Telephone Encounter (Signed)
There is no other ointmentto replce it with.

## 2015-10-05 NOTE — Telephone Encounter (Signed)
Debbi, can we get a prior authorization for this?

## 2015-10-08 NOTE — Telephone Encounter (Signed)
Will have to do an appeal and you'll need to give me a reason she needs the ointment

## 2015-10-08 NOTE — Telephone Encounter (Signed)
Please review

## 2015-10-08 NOTE — Telephone Encounter (Signed)
She has HSV type 1 and 2

## 2015-10-08 NOTE — Telephone Encounter (Signed)
RX for Fioricet called into [facility] per MMM Pt notified

## 2015-10-23 ENCOUNTER — Telehealth: Payer: Self-pay

## 2015-10-23 NOTE — Telephone Encounter (Signed)
Please let patient know that insurance will not approve acyclovir

## 2015-10-29 ENCOUNTER — Other Ambulatory Visit: Payer: Self-pay | Admitting: Cardiology

## 2015-10-29 ENCOUNTER — Other Ambulatory Visit: Payer: Self-pay | Admitting: Nurse Practitioner

## 2015-10-29 DIAGNOSIS — F329 Major depressive disorder, single episode, unspecified: Secondary | ICD-10-CM

## 2015-10-29 DIAGNOSIS — F32A Depression, unspecified: Secondary | ICD-10-CM

## 2015-10-29 DIAGNOSIS — R3915 Urgency of urination: Secondary | ICD-10-CM

## 2015-10-29 DIAGNOSIS — I10 Essential (primary) hypertension: Secondary | ICD-10-CM

## 2015-10-29 DIAGNOSIS — B009 Herpesviral infection, unspecified: Secondary | ICD-10-CM

## 2015-10-30 ENCOUNTER — Other Ambulatory Visit: Payer: Self-pay | Admitting: Nurse Practitioner

## 2015-10-31 NOTE — Telephone Encounter (Signed)
Patient last seen in office on 09-03-15. Rx last filled on 08-01-15 for #60 with 1RF. Please advise and route to Pool B so nurse can phone in to pharmacy

## 2015-11-01 ENCOUNTER — Ambulatory Visit: Payer: Medicare Other | Admitting: Nurse Practitioner

## 2015-11-01 NOTE — Telephone Encounter (Signed)
Please call in daizepam with 1 refills 

## 2015-11-01 NOTE — Telephone Encounter (Signed)
rx called into pharmacy

## 2015-11-13 ENCOUNTER — Encounter: Payer: Self-pay | Admitting: Nurse Practitioner

## 2015-11-13 ENCOUNTER — Ambulatory Visit (INDEPENDENT_AMBULATORY_CARE_PROVIDER_SITE_OTHER): Payer: Medicare Other | Admitting: Nurse Practitioner

## 2015-11-13 VITALS — BP 114/82 | HR 84 | Temp 98.1°F | Ht 61.0 in | Wt 136.8 lb

## 2015-11-13 DIAGNOSIS — M791 Myalgia: Secondary | ICD-10-CM | POA: Diagnosis not present

## 2015-11-13 DIAGNOSIS — E785 Hyperlipidemia, unspecified: Secondary | ICD-10-CM | POA: Diagnosis not present

## 2015-11-13 DIAGNOSIS — F411 Generalized anxiety disorder: Secondary | ICD-10-CM

## 2015-11-13 DIAGNOSIS — J309 Allergic rhinitis, unspecified: Secondary | ICD-10-CM

## 2015-11-13 DIAGNOSIS — G4700 Insomnia, unspecified: Secondary | ICD-10-CM

## 2015-11-13 DIAGNOSIS — R569 Unspecified convulsions: Secondary | ICD-10-CM

## 2015-11-13 DIAGNOSIS — Q762 Congenital spondylolisthesis: Secondary | ICD-10-CM

## 2015-11-13 DIAGNOSIS — M609 Myositis, unspecified: Secondary | ICD-10-CM

## 2015-11-13 DIAGNOSIS — I10 Essential (primary) hypertension: Secondary | ICD-10-CM | POA: Diagnosis not present

## 2015-11-13 DIAGNOSIS — M47812 Spondylosis without myelopathy or radiculopathy, cervical region: Secondary | ICD-10-CM | POA: Diagnosis not present

## 2015-11-13 DIAGNOSIS — F32A Depression, unspecified: Secondary | ICD-10-CM

## 2015-11-13 DIAGNOSIS — R3915 Urgency of urination: Secondary | ICD-10-CM | POA: Diagnosis not present

## 2015-11-13 DIAGNOSIS — K2100 Gastro-esophageal reflux disease with esophagitis, without bleeding: Secondary | ICD-10-CM

## 2015-11-13 DIAGNOSIS — B009 Herpesviral infection, unspecified: Secondary | ICD-10-CM | POA: Diagnosis not present

## 2015-11-13 DIAGNOSIS — IMO0001 Reserved for inherently not codable concepts without codable children: Secondary | ICD-10-CM

## 2015-11-13 DIAGNOSIS — F329 Major depressive disorder, single episode, unspecified: Secondary | ICD-10-CM | POA: Diagnosis not present

## 2015-11-13 MED ORDER — VALACYCLOVIR HCL 500 MG PO TABS: 500.0000 mg | ORAL_TABLET | Freq: Two times a day (BID) | ORAL | 1 refills | Status: DC

## 2015-11-13 MED ORDER — FUROSEMIDE 40 MG PO TABS: 40.0000 mg | ORAL_TABLET | Freq: Every day | ORAL | 1 refills | Status: DC

## 2015-11-13 MED ORDER — METHOCARBAMOL 500 MG PO TABS: 500.0000 mg | ORAL_TABLET | Freq: Three times a day (TID) | ORAL | 1 refills | Status: DC | PRN

## 2015-11-13 MED ORDER — DULOXETINE HCL 60 MG PO CPEP: 60.0000 mg | ORAL_CAPSULE | Freq: Every day | ORAL | 1 refills | Status: DC

## 2015-11-13 MED ORDER — HYDROCODONE-ACETAMINOPHEN 7.5-325 MG PO TABS: 1.0000 | ORAL_TABLET | Freq: Two times a day (BID) | ORAL | 0 refills | Status: DC

## 2015-11-13 MED ORDER — ESCITALOPRAM OXALATE 20 MG PO TABS: 20.0000 mg | ORAL_TABLET | Freq: Every day | ORAL | 1 refills | Status: DC

## 2015-11-13 MED ORDER — AMOXICILLIN 875 MG PO TABS: 875.0000 mg | ORAL_TABLET | Freq: Two times a day (BID) | ORAL | 0 refills | Status: DC

## 2015-11-13 MED ORDER — LEVETIRACETAM 500 MG PO TABS: ORAL_TABLET | ORAL | 1 refills | Status: DC

## 2015-11-13 MED ORDER — TOLTERODINE TARTRATE 2 MG PO TABS: 2.0000 mg | ORAL_TABLET | Freq: Two times a day (BID) | ORAL | 1 refills | Status: DC

## 2015-11-13 MED ORDER — HYDROCODONE-ACETAMINOPHEN 7.5-325 MG PO TABS: ORAL_TABLET | ORAL | 0 refills | Status: DC

## 2015-11-13 MED ORDER — ROSUVASTATIN CALCIUM 10 MG PO TABS: 10.0000 mg | ORAL_TABLET | Freq: Every day | ORAL | 1 refills | Status: DC

## 2015-11-13 MED ORDER — FLUTICASONE FUROATE-VILANTEROL 200-25 MCG/INH IN AEPB: 1.0000 | INHALATION_SPRAY | Freq: Every day | RESPIRATORY_TRACT | 1 refills | Status: DC

## 2015-11-13 MED ORDER — DEXLANSOPRAZOLE 60 MG PO CPDR: 1.0000 | DELAYED_RELEASE_CAPSULE | Freq: Every day | ORAL | 1 refills | Status: DC

## 2015-11-13 MED ORDER — LORATADINE 10 MG PO CAPS: 10.0000 mg | ORAL_CAPSULE | Freq: Every day | ORAL | 3 refills | Status: DC

## 2015-11-13 MED ORDER — TRAZODONE HCL 150 MG PO TABS: 150.0000 mg | ORAL_TABLET | Freq: Every day | ORAL | 1 refills | Status: DC

## 2015-11-13 MED ORDER — DIAZEPAM 5 MG PO TABS: 5.0000 mg | ORAL_TABLET | Freq: Two times a day (BID) | ORAL | 2 refills | Status: DC | PRN

## 2015-11-13 MED ORDER — RANITIDINE HCL 150 MG PO TABS: 150.0000 mg | ORAL_TABLET | Freq: Every day | ORAL | 1 refills | Status: DC

## 2015-11-13 MED ORDER — BUSPIRONE HCL 10 MG PO TABS: 10.0000 mg | ORAL_TABLET | Freq: Two times a day (BID) | ORAL | 1 refills | Status: DC

## 2015-11-13 NOTE — Progress Notes (Signed)
Subjective:    Patient ID: [name], female    DOB: 1955/09/24, 60 y.o.   MRN: [id number]  HPI  Patient comes in today for follow up of chronic medical problems- she is doing well today without complaints- no changes since last visit. She says that she is going out of town for 3 months and needs a 3 month supply of meds. She does say tat her herpes outbreaks have increased but she thinks that is due to stress.Insurance will not pay for acyclovir cream which she says really helped. Pain assessment: Pain location- lower back worst but pain is actually all over Pain on scale of 1-10- 7/10 Frequency- What increases pain-daily What makes pain Better- rest Effects on ADL - some days she cannot do anything Any change in general medical condition-none  Current medications- norco 7.5/325 BID prn Effectiveness of current meds-cuts pain down but does not completely relieve pain Adverse reactions form pain meds-none  Pill count performed-Yes Urine drug screen- No Was the [place] reviewed- yes  If yes were their any concerning findings? - no concerns  Outpatient Encounter Prescriptions as of 11/13/2015  Medication Sig  . acyclovir ointment (ZOVIRAX) 5 % Apply 1 application topically every 3 (three) hours.  [name] albuterol (PROVENTIL) (2.5 MG/3ML) 0.083% nebulizer solution INHALE ONE VIAL IN NEBULIZER EVERY 4 HOURS AS NEEDED FOR WHEEZING.  . BREO ELLIPTA 200-25 MCG/INH AEPB ONE PUFF BY MOUTH DAILY.  . busPIRone (BUSPAR) 10 MG tablet TAKE (1) TABLET TWICE DAILY.  . butalbital-acetaminophen-caffeine (FIORICET, ESGIC) 50-325-40 MG tablet TAKE 1 CAPSULE BY MOUTH TWICE DAILY AS NEEDED FOR TENSION HEADACHE.  [name] DEXILANT 60 MG capsule TAKE 1 CAPSULE BY MOUTH ONCE A DAY.  . diazepam (VALIUM) 5 MG tablet TAKE 1 TABLET EVERY 12 HOURS AS NEEDED FOR ANXIETY.  . DULoxetine (CYMBALTA) 60 MG capsule TAKE 1 CAPSULE BY MOUTH AT BEDTIME.  [name] escitalopram (LEXAPRO) 20 MG tablet TAKE 1 TABLET ONCE DAILY.  .  fludrocortisone (FLORINEF) 0.1 MG tablet TAKE 1 TABLET ONCE DAILY.  . furosemide (LASIX) 40 MG tablet TAKE (1) TABLET BY MOUTH ONCE DAILY AS NEEDED.  [name] HYDROcodone-acetaminophen (NORCO) 7.5-325 MG tablet Take 1 tablet by mouth every 6 (six) hours as needed for moderate pain.  [name] HYDROcodone-acetaminophen (NORCO) 7.5-325 MG tablet Take 1 tablet by mouth every 6 (six) hours as needed for moderate pain.  [name] HYDROcodone-acetaminophen (NORCO) 7.5-325 MG tablet TAKE 1 TABLET BY MOUTH 3 TIMES DAILY.  [name] ipratropium (ATROVENT) 0.02 % nebulizer solution INHALE 1 VIAL IN NEBULIZER EVERY 4 HOURS AS NEEDED FOR WHEEZING.  . levETIRAcetam (KEPPRA) 500 MG tablet TAKE (1) TABLET TWICE DAILY.  [name] Loratadine 10 MG CAPS Take 1 capsule (10 mg total) by mouth daily.  . methocarbamol (ROBAXIN) 500 MG tablet Take 500 mg by mouth every 8 (eight) hours as needed.  . metroNIDAZOLE (FLAGYL) 500 MG tablet Take 1 tablet (500 mg total) by mouth 2 (two) times daily.  . midodrine (PROAMATINE) 5 MG tablet TAKE (1/2) TABLET THREE TIMES DAILY.  . nitroGLYCERIN (NITROSTAT) 0.4 MG SL tablet Place 1 tablet (0.4 mg total) under the tongue every 5 (five) minutes as needed for chest pain.  . ranitidine (ZANTAC) 150 MG tablet TAKE 1 TABLET BY MOUTH AT BEDTIME.  . rosuvastatin (CRESTOR) 10 MG tablet TAKE (1) TABLET BY MOUTH ONCE DAILY.  [name] tobramycin (TOBREX) 0.3 % ophthalmic solution Place 2 drops into both eyes every 4 (four) hours.  [name] tolterodine (DETROL) 2 MG tablet TAKE (1) TABLET  TWICE DAILY.  . traZODone (DESYREL) 150 MG tablet TAKE 1 TABLET BY MOUTH AT BEDTIME.  . valACYclovir (VALTREX) 500 MG tablet TAKE (1) TABLET TWICE DAILY.  . VENTOLIN HFA 108 (90 Base) MCG/ACT inhaler INHALE 2 PUFFS EVERY 6 HOURS AS NEEDED.   No facility-administered encounter medications on file as of 11/13/2015.    Patient Active Problem List   Diagnosis Date Noted  . Diverticulitis of colon 08/24/2015  . Chest pain 11/29/2014  . Insomnia 09/05/2014  .  Allergic rhinitis 09/05/2014  . Cervical spondylosis without myelopathy 12/19/2013    Class: Chronic  . Neural foraminal stenosis of cervical spine 12/19/2013  . Preop cardiovascular exam 11/24/2013  . Spinal stenosis in cervical region 09/19/2013  . Cervical radiculitis 09/19/2013  . Dysphagia 08/05/2013  . Lumbosacral spondylosis without myelopathy 11/16/2012  . Postlaminectomy syndrome, lumbar region 11/16/2012  . Sinus tachycardia ([place]) 10/22/2011  . Chest pain, unspecified 09/30/2011  . Herpes simplex virus (HSV) infection 10/31/2008  . Hyperlipidemia with target LDL less than 100 10/31/2008  . Anxiety state 10/31/2008  . Depression 10/31/2008  . Essential hypertension 10/31/2008  . Asthma 10/31/2008  . GERD 10/31/2008  . SPINAL STENOSIS OF LUMBAR REGION 10/31/2008  . Myalgia and myositis 10/31/2008  . Osteoporosis 10/31/2008  . SPONDYLOLISTHESIS 10/31/2008  . SYNCOPE 10/31/2008  . Sleep apnea 10/31/2008  . HEAD TRAUMA, CLOSED 10/31/2008      Review of Systems  Constitutional: Negative.   HENT: Negative.   Respiratory: Negative.   Cardiovascular: Negative.   Genitourinary: Negative.   Neurological: Negative.   Psychiatric/Behavioral: Negative.   All other systems reviewed and are negative.      Objective:   Physical Exam  Constitutional: She is oriented to person, place, and time. She appears well-developed and well-nourished. No distress.  HENT:  Nose: Nose normal.  Mouth/Throat: Oropharynx is clear and moist.  Eyes: EOM are normal.  Neck: Trachea normal, normal range of motion and full passive range of motion without pain. Neck supple. No JVD present. Carotid bruit is not present. No thyromegaly present.  Cardiovascular: Normal rate, regular rhythm, normal heart sounds and intact distal pulses.  Exam reveals no gallop and no friction rub.   No murmur heard. Pulmonary/Chest: Effort normal and breath sounds normal.  Abdominal: Soft. Bowel sounds are normal.  She exhibits no distension and no mass. There is no tenderness.  Musculoskeletal: Normal range of motion.  Lymphadenopathy:    She has no cervical adenopathy.  Neurological: She is alert and oriented to person, place, and time. She has normal reflexes.  Skin: Skin is warm and dry.  Psychiatric: She has a normal mood and affect. Her behavior is normal. Judgment and thought content normal.    BP 114/82 (BP Location: Left Arm, Patient Position: Sitting, Cuff Size: Normal)   Pulse 84   Temp 98.1 F (36.7 C) (Oral)   Ht 5' 1"  (1.549 m)   Wt 136 lb 12.8 oz (62.1 kg)   BMI 25.85 kg/m        Assessment & Plan:   1. Essential hypertension   2. Gastroesophageal reflux disease with esophagitis   3. Anxiety state   4. Depression   5. Herpes simplex virus (HSV) infection   6. Insomnia   7. Cervical spondylosis without myelopathy   8. Seizures ([place])   9. Urinary urgency   10. Hyperlipidemia with target LDL less than 100   11. SPONDYLOLISTHESIS   12. Myalgia and myositis   13. Allergic rhinitis, unspecified allergic  rhinitis type    Meds ordered this encounter  Medications  . HYDROcodone-acetaminophen (NORCO) 7.5-325 MG tablet    Sig: TAKE 1 TABLET BY MOUTH 2 TIMES DAILY.    Dispense:  60 tablet    Refill:  0    DO NOT FILL TILL 01/30/16    Order Specific Question:   Supervising Provider    Answer:   Assunta Found L [[id number]]  . HYDROcodone-acetaminophen (NORCO) 7.5-325 MG tablet    Sig: Take 1 tablet by mouth 2 (two) times daily.    Dispense:  60 tablet    Refill:  0    DO NOT FILL TILL 12/30/15    Order Specific Question:   Supervising Provider    Answer:   Assunta Found L [[id number]]  . HYDROcodone-acetaminophen (NORCO) 7.5-325 MG tablet    Sig: Take 1 tablet by mouth 2 (two) times daily.    Dispense:  60 tablet    Refill:  0    DO NOT FILL TILL 12/01/15    Order Specific Question:   Supervising Provider    Answer:   Assunta Found L [[id number]]  . DULoxetine (CYMBALTA) 60 MG capsule     Sig: Take 1 capsule (60 mg total) by mouth at bedtime.    Dispense:  90 capsule    Refill:  1    Order Specific Question:   Supervising Provider    Answer:   VINCENT, CAROL L [[id number]]  . furosemide (LASIX) 40 MG tablet    Sig: Take 1 tablet (40 mg total) by mouth daily.    Dispense:  90 tablet    Refill:  1    Order Specific Question:   Supervising Provider    Answer:   VINCENT, CAROL L [[id number]]  . valACYclovir (VALTREX) 500 MG tablet    Sig: Take 1 tablet (500 mg total) by mouth 2 (two) times daily.    Dispense:  180 tablet    Refill:  1    Order Specific Question:   Supervising Provider    Answer:   VINCENT, CAROL L [[id number]]  . levETIRAcetam (KEPPRA) 500 MG tablet    Sig: TAKE (1) TABLET TWICE DAILY.    Dispense:  180 tablet    Refill:  1    Please schedule regular Office Visit.  Thank you.    Order Specific Question:   Supervising Provider    Answer:   Eustaquio Maize [[id number]]  . dexlansoprazole (DEXILANT) 60 MG capsule    Sig: Take 1 capsule (60 mg total) by mouth daily.    Dispense:  90 capsule    Refill:  1    Order Specific Question:   Supervising Provider    Answer:   VINCENT, CAROL L [[id number]]  . methocarbamol (ROBAXIN) 500 MG tablet    Sig: Take 1 tablet (500 mg total) by mouth every 8 (eight) hours as needed.    Dispense:  90 tablet    Refill:  1    Order Specific Question:   Supervising Provider    Answer:   VINCENT, CAROL L [[id number]]  . traZODone (DESYREL) 150 MG tablet    Sig: Take 1 tablet (150 mg total) by mouth at bedtime.    Dispense:  90 tablet    Refill:  1    Order Specific Question:   Supervising Provider    Answer:   VINCENT, CAROL L [[id number]]  . tolterodine (DETROL) 2 MG tablet    Sig: Take 1 tablet (2 mg total) by  mouth 2 (two) times daily.    Dispense:  180 tablet    Refill:  1    Order Specific Question:   Supervising Provider    Answer:   VINCENT, CAROL L [[id number]]  . rosuvastatin (CRESTOR) 10 MG tablet    Sig: Take 1 tablet (10 mg total) by mouth daily.      Dispense:  90 tablet    Refill:  1    Order Specific Question:   Supervising Provider    Answer:   VINCENT, CAROL L [[id number]]  . escitalopram (LEXAPRO) 20 MG tablet    Sig: Take 1 tablet (20 mg total) by mouth daily.    Dispense:  90 tablet    Refill:  1    Order Specific Question:   Supervising Provider    Answer:   VINCENT, CAROL L [[id number]]  . ranitidine (ZANTAC) 150 MG tablet    Sig: Take 1 tablet (150 mg total) by mouth at bedtime.    Dispense:  90 tablet    Refill:  1    Order Specific Question:   Supervising Provider    Answer:   VINCENT, CAROL L [[id number]]  . busPIRone (BUSPAR) 10 MG tablet    Sig: Take 1 tablet (10 mg total) by mouth 2 (two) times daily.    Dispense:  180 tablet    Refill:  1    Order Specific Question:   Supervising Provider    Answer:   VINCENT, CAROL L [[id number]]  . fluticasone furoate-vilanterol (BREO ELLIPTA) 200-25 MCG/INH AEPB    Sig: Inhale 1 puff into the lungs daily.    Dispense:  180 each    Refill:  1    Order Specific Question:   Supervising Provider    Answer:   VINCENT, CAROL L [[id number]]  . diazepam (VALIUM) 5 MG tablet    Sig: Take 1 tablet (5 mg total) by mouth every 12 (twelve) hours as needed for anxiety.    Dispense:  60 tablet    Refill:  2    Order Specific Question:   Supervising Provider    Answer:   VINCENT, CAROL L [[id number]]  . Loratadine 10 MG CAPS    Sig: Take 1 capsule (10 mg total) by mouth daily.    Dispense:  90 each    Refill:  3    Order Specific Question:   Supervising Provider    Answer:   Evette Doffing, CAROL L [[id number]]   Orders Placed This Encounter  Procedures  . CMP14+EGFR  . Lipid panel   Exercise as much as possible Labs pending follow up prn RTO prn  [name] Hassell Done, FNP

## 2015-11-14 LAB — CMP14+EGFR
ALT: 108 IU/L — ABNORMAL HIGH (ref 0–32)
AST: 104 IU/L — ABNORMAL HIGH (ref 0–40)
Albumin/Globulin Ratio: 1.8 (ref 1.2–2.2)
Albumin: 4.7 g/dL (ref 3.5–5.5)
Alkaline Phosphatase: 197 IU/L — ABNORMAL HIGH (ref 39–117)
BUN/Creatinine Ratio: 18 (ref 9–23)
BUN: 15 mg/dL (ref 6–24)
Bilirubin Total: 0.3 mg/dL (ref 0.0–1.2)
CO2: 28 mmol/L (ref 18–29)
Calcium: 9.9 mg/dL (ref 8.7–10.2)
Chloride: 100 mmol/L (ref 96–106)
Creatinine, Ser: 0.84 mg/dL (ref 0.57–1.00)
GFR calc Af Amer: 88 mL/min/1.73
GFR calc non Af Amer: 76 mL/min/1.73
Globulin, Total: 2.6 g/dL (ref 1.5–4.5)
Glucose: 75 mg/dL (ref 65–99)
Potassium: 4.5 mmol/L (ref 3.5–5.2)
Sodium: 143 mmol/L (ref 134–144)
Total Protein: 7.3 g/dL (ref 6.0–8.5)

## 2015-11-14 LAB — LIPID PANEL
Chol/HDL Ratio: 2.8 ratio (ref 0.0–4.4)
Cholesterol, Total: 192 mg/dL (ref 100–199)
HDL: 69 mg/dL
LDL Calculated: 101 mg/dL — ABNORMAL HIGH (ref 0–99)
Triglycerides: 111 mg/dL (ref 0–149)
VLDL Cholesterol Cal: 22 mg/dL (ref 5–40)

## 2015-11-26 ENCOUNTER — Telehealth: Payer: Self-pay | Admitting: Nurse Practitioner

## 2015-11-26 NOTE — Telephone Encounter (Signed)
Returned patient's phone call.  Patient states that she has a abscess and was prescribed amoxicillin but thinks something else was suppose to be sent to the pharmacy

## 2015-11-26 NOTE — Telephone Encounter (Signed)
I am not sure what she is talking about.

## 2015-11-27 NOTE — Telephone Encounter (Signed)
Needs to see dentist- does she have appointment

## 2015-11-27 NOTE — Telephone Encounter (Signed)
Patient does not have an appointment but she will contact her dentist and get an appointment asap.

## 2015-11-27 NOTE — Telephone Encounter (Signed)
No - the amoxicillin should have helped- is it no better

## 2015-11-27 NOTE — Telephone Encounter (Signed)
Patient said she thinks she still has infection because she is still having pain in the gums.  She said at the appointment you had mentioned to her prescribing something other than the Amoxicillin but she had spoken up and said she wanted Amoxicillin so you prescribed that instead.

## 2015-12-01 ENCOUNTER — Other Ambulatory Visit: Payer: Self-pay | Admitting: Nurse Practitioner

## 2015-12-01 DIAGNOSIS — M47812 Spondylosis without myelopathy or radiculopathy, cervical region: Secondary | ICD-10-CM

## 2015-12-04 NOTE — Telephone Encounter (Signed)
She was given 3 rx on 11/13/15 for this

## 2016-01-07 ENCOUNTER — Other Ambulatory Visit: Payer: Self-pay | Admitting: Nurse Practitioner

## 2016-01-08 ENCOUNTER — Other Ambulatory Visit: Payer: Self-pay | Admitting: Nurse Practitioner

## 2016-01-10 ENCOUNTER — Other Ambulatory Visit: Payer: Self-pay | Admitting: *Deleted

## 2016-01-10 NOTE — Telephone Encounter (Signed)
MMM pt  Last filled 12/01/15, last seen 11/13/15. Route to pool B

## 2016-01-11 NOTE — Telephone Encounter (Signed)
Dr Christell Constant printed a Rx 01/08/2016, was that not picked up?

## 2016-01-14 NOTE — Telephone Encounter (Signed)
Confirmed verbally w/ Asher Muir Bullins that she called this in last week

## 2016-01-23 ENCOUNTER — Other Ambulatory Visit: Payer: Self-pay | Admitting: *Deleted

## 2016-01-23 MED ORDER — NITROGLYCERIN 0.4 MG SL SUBL: 0.4000 mg | SUBLINGUAL_TABLET | SUBLINGUAL | 0 refills | Status: DC | PRN

## 2016-02-12 ENCOUNTER — Ambulatory Visit (INDEPENDENT_AMBULATORY_CARE_PROVIDER_SITE_OTHER): Payer: Medicare Other | Admitting: Nurse Practitioner

## 2016-02-12 ENCOUNTER — Ambulatory Visit (INDEPENDENT_AMBULATORY_CARE_PROVIDER_SITE_OTHER): Payer: Medicare Other

## 2016-02-12 ENCOUNTER — Encounter: Payer: Self-pay | Admitting: Nurse Practitioner

## 2016-02-12 VITALS — BP 125/84 | HR 73 | Temp 98.0°F | Ht 61.0 in | Wt 140.4 lb

## 2016-02-12 DIAGNOSIS — N3001 Acute cystitis with hematuria: Secondary | ICD-10-CM

## 2016-02-12 DIAGNOSIS — R3915 Urgency of urination: Secondary | ICD-10-CM

## 2016-02-12 DIAGNOSIS — K2100 Gastro-esophageal reflux disease with esophagitis, without bleeding: Secondary | ICD-10-CM

## 2016-02-12 DIAGNOSIS — M25551 Pain in right hip: Secondary | ICD-10-CM

## 2016-02-12 DIAGNOSIS — F331 Major depressive disorder, recurrent, moderate: Secondary | ICD-10-CM | POA: Diagnosis not present

## 2016-02-12 DIAGNOSIS — I10 Essential (primary) hypertension: Secondary | ICD-10-CM | POA: Diagnosis not present

## 2016-02-12 DIAGNOSIS — F5101 Primary insomnia: Secondary | ICD-10-CM | POA: Diagnosis not present

## 2016-02-12 DIAGNOSIS — M25512 Pain in left shoulder: Secondary | ICD-10-CM | POA: Diagnosis not present

## 2016-02-12 DIAGNOSIS — Z23 Encounter for immunization: Secondary | ICD-10-CM | POA: Diagnosis not present

## 2016-02-12 DIAGNOSIS — B009 Herpesviral infection, unspecified: Secondary | ICD-10-CM

## 2016-02-12 DIAGNOSIS — E785 Hyperlipidemia, unspecified: Secondary | ICD-10-CM | POA: Diagnosis not present

## 2016-02-12 DIAGNOSIS — N941 Unspecified dyspareunia: Secondary | ICD-10-CM

## 2016-02-12 DIAGNOSIS — R109 Unspecified abdominal pain: Secondary | ICD-10-CM

## 2016-02-12 DIAGNOSIS — R569 Unspecified convulsions: Secondary | ICD-10-CM | POA: Diagnosis not present

## 2016-02-12 DIAGNOSIS — M47812 Spondylosis without myelopathy or radiculopathy, cervical region: Secondary | ICD-10-CM | POA: Diagnosis not present

## 2016-02-12 DIAGNOSIS — F411 Generalized anxiety disorder: Secondary | ICD-10-CM

## 2016-02-12 LAB — CMP14+EGFR
A/G RATIO: 1.6 (ref 1.2–2.2)
ALBUMIN: 4.2 g/dL (ref 3.5–5.5)
ALK PHOS: 113 IU/L (ref 39–117)
ALT: 20 IU/L (ref 0–32)
AST: 19 IU/L (ref 0–40)
BILIRUBIN TOTAL: 0.3 mg/dL (ref 0.0–1.2)
BUN / CREAT RATIO: 13 (ref 9–23)
BUN: 11 mg/dL (ref 6–24)
CHLORIDE: 100 mmol/L (ref 96–106)
CO2: 26 mmol/L (ref 18–29)
CREATININE: 0.87 mg/dL (ref 0.57–1.00)
Calcium: 9.7 mg/dL (ref 8.7–10.2)
GFR calc Af Amer: 84 mL/min/{1.73_m2} (ref >59)
GFR calc non Af Amer: 73 mL/min/{1.73_m2} (ref >59)
GLOBULIN, TOTAL: 2.6 g/dL (ref 1.5–4.5)
Glucose: 81 mg/dL (ref 65–99)
POTASSIUM: 4.6 mmol/L (ref 3.5–5.2)
SODIUM: 142 mmol/L (ref 134–144)
Total Protein: 6.8 g/dL (ref 6.0–8.5)

## 2016-02-12 LAB — URINALYSIS, COMPLETE
Bilirubin, UA: NEGATIVE
GLUCOSE, UA: NEGATIVE
KETONES UA: NEGATIVE
NITRITE UA: NEGATIVE
PROTEIN UA: NEGATIVE
SPEC GRAV UA: 1.01 (ref 1.005–1.030)
Urobilinogen, Ur: 0.2 mg/dL (ref 0.2–1.0)
pH, UA: 6 (ref 5.0–7.5)

## 2016-02-12 LAB — LIPID PANEL
CHOL/HDL RATIO: 2.8 ratio (ref 0.0–4.4)
Cholesterol, Total: 165 mg/dL (ref 100–199)
HDL: 59 mg/dL (ref >39)
LDL CALC: 82 mg/dL (ref 0–99)
Triglycerides: 119 mg/dL (ref 0–149)
VLDL Cholesterol Cal: 24 mg/dL (ref 5–40)

## 2016-02-12 LAB — MICROSCOPIC EXAMINATION

## 2016-02-12 MED ORDER — TOLTERODINE TARTRATE 2 MG PO TABS: 2.0000 mg | ORAL_TABLET | Freq: Two times a day (BID) | ORAL | 1 refills | Status: DC

## 2016-02-12 MED ORDER — LORATADINE 10 MG PO CAPS: 10.0000 mg | ORAL_CAPSULE | Freq: Every day | ORAL | 3 refills | Status: DC

## 2016-02-12 MED ORDER — HYDROCODONE-ACETAMINOPHEN 7.5-325 MG PO TABS: 1.0000 | ORAL_TABLET | Freq: Two times a day (BID) | ORAL | 0 refills | Status: DC

## 2016-02-12 MED ORDER — RANITIDINE HCL 150 MG PO TABS: 150.0000 mg | ORAL_TABLET | Freq: Every day | ORAL | 1 refills | Status: DC

## 2016-02-12 MED ORDER — FLUCONAZOLE 150 MG PO TABS: 150.0000 mg | ORAL_TABLET | Freq: Once | ORAL | 0 refills | Status: AC

## 2016-02-12 MED ORDER — BUSPIRONE HCL 10 MG PO TABS: 10.0000 mg | ORAL_TABLET | Freq: Two times a day (BID) | ORAL | 1 refills | Status: DC

## 2016-02-12 MED ORDER — ESCITALOPRAM OXALATE 20 MG PO TABS: 20.0000 mg | ORAL_TABLET | Freq: Every day | ORAL | 1 refills | Status: DC

## 2016-02-12 MED ORDER — ESTRADIOL 0.1 MG/GM VA CREA: 1.0000 | TOPICAL_CREAM | Freq: Every day | VAGINAL | 12 refills | Status: DC

## 2016-02-12 MED ORDER — LAMOTRIGINE 100 MG PO TABS
100.0000 mg | ORAL_TABLET | Freq: Every day | ORAL | 2 refills | Status: DC
Start: 2016-02-12 — End: 2016-05-03

## 2016-02-12 MED ORDER — HYDROCODONE-ACETAMINOPHEN 7.5-325 MG PO TABS: ORAL_TABLET | ORAL | 0 refills | Status: DC

## 2016-02-12 MED ORDER — METHOCARBAMOL 500 MG PO TABS: 500.0000 mg | ORAL_TABLET | Freq: Three times a day (TID) | ORAL | 1 refills | Status: DC | PRN

## 2016-02-12 MED ORDER — DEXLANSOPRAZOLE 60 MG PO CPDR: 1.0000 | DELAYED_RELEASE_CAPSULE | Freq: Every day | ORAL | 1 refills | Status: DC

## 2016-02-12 MED ORDER — FUROSEMIDE 40 MG PO TABS: 40.0000 mg | ORAL_TABLET | Freq: Every day | ORAL | 1 refills | Status: DC

## 2016-02-12 MED ORDER — DULOXETINE HCL 60 MG PO CPEP: 60.0000 mg | ORAL_CAPSULE | Freq: Every day | ORAL | 1 refills | Status: DC

## 2016-02-12 MED ORDER — TRAZODONE HCL 150 MG PO TABS: 150.0000 mg | ORAL_TABLET | Freq: Every day | ORAL | 1 refills | Status: DC

## 2016-02-12 MED ORDER — VALACYCLOVIR HCL 500 MG PO TABS: 500.0000 mg | ORAL_TABLET | Freq: Two times a day (BID) | ORAL | 1 refills | Status: DC

## 2016-02-12 MED ORDER — METHYLPREDNISOLONE ACETATE 40 MG/ML IJ SUSP
40.0000 mg | Freq: Once | INTRAMUSCULAR | Status: AC
  Administered 2016-02-12: 40 mg via INTRA_ARTICULAR

## 2016-02-12 MED ORDER — BUPIVACAINE HCL 0.25 % IJ SOLN
1.0000 mL | Freq: Once | INTRAMUSCULAR | Status: AC
  Administered 2016-02-12: 1 mL via INTRA_ARTICULAR

## 2016-02-12 MED ORDER — ROSUVASTATIN CALCIUM 10 MG PO TABS: 10.0000 mg | ORAL_TABLET | Freq: Every day | ORAL | 1 refills | Status: DC

## 2016-02-12 MED ORDER — CIPROFLOXACIN HCL 500 MG PO TABS: 500.0000 mg | ORAL_TABLET | Freq: Two times a day (BID) | ORAL | 0 refills | Status: DC

## 2016-02-12 MED ORDER — LEVETIRACETAM 500 MG PO TABS: ORAL_TABLET | ORAL | 1 refills | Status: DC

## 2016-02-12 NOTE — Addendum Note (Signed)
Addended by: Ronnell Freshwater on: 02/12/2016 01:12 PM   Modules accepted: Orders

## 2016-02-12 NOTE — Patient Instructions (Signed)
Asymptomatic Bacteriuria, Female  Asymptomatic bacteriuria is the presence of a large number of bacteria in your urine without the usual symptoms of burning or frequent urination. The following conditions increase the risk of asymptomatic bacteriuria:   Diabetes mellitus.   Advanced age.   Pregnancy in the first trimester.   Kidney stones.   Kidney transplants.   Leaky kidney tube valve in young children (reflux).  Treatment for this condition is not needed in most people and can lead to other problems such as too much yeast and growth of resistant bacteria. However, some people, such as pregnant women, do need treatment to prevent kidney infection. Asymptomatic bacteriuria in pregnancy is also associated with fetal growth restriction, premature labor, and newborn death.  HOME CARE INSTRUCTIONS  Monitor your condition for any changes. The following actions may help to relieve any discomfort you are feeling:   Drink enough water and fluids to keep your urine clear or pale yellow. Go to the bathroom more often to keep your bladder empty.   Keep the area around your vagina and rectum clean. Wipe yourself from front to back after urinating.  SEEK IMMEDIATE MEDICAL CARE IF:   You develop signs of an infection such as:    Burning with urination.    Frequency of voiding.    Back pain.    Fever.   You have blood in the urine.   You develop a fever.  MAKE SURE YOU:   Understand these instructions.   Will watch your condition.   Will get help right away if you are not doing well or get worse.     This information is not intended to replace advice given to you by your health care provider. Make sure you discuss any questions you have with your health care provider.     Document Released: 03/17/2005 Document Revised: 04/07/2014 Document Reviewed: 09/06/2012  Elsevier Interactive Patient Education 2017 [employer] Inc.

## 2016-02-12 NOTE — Addendum Note (Signed)
Addended by: Bennie Pierini on: 02/12/2016 01:52 PM   Modules accepted: Orders

## 2016-02-12 NOTE — Progress Notes (Signed)
Subjective:    Patient ID: [name], female    DOB: January 04, 1956, 60 y.o.   MRN: [id number]  HPI   Patient comes in today for follow up of chronic medical problems. She has a complaint of right shoulder and hip pain which according to her never went away since she broke her left foot last year. She rates the pains as 10/10. She is requesting for x-ray of right hip today. She wants steroid injection in her left shoulder (chronic pain)- she said this has worked for her in the past.   * she also has a complaint of dysuria, which according to her started a couple of weeks ago. She has not used anything to make it better.  * complain of painful sexual intercorse  Outpatient Encounter Prescriptions as of 02/12/2016  Medication Sig  . acyclovir ointment (ZOVIRAX) 5 % Apply 1 application topically every 3 (three) hours.  [name] albuterol (PROVENTIL) (2.5 MG/3ML) 0.083% nebulizer solution USE 1 VIAL IN NEBULIZER EVERY 4 HOURS AS NEEDED FOR WHEEZING.  . busPIRone (BUSPAR) 10 MG tablet Take 1 tablet (10 mg total) by mouth 2 (two) times daily.  . butalbital-acetaminophen-caffeine (FIORICET, ESGIC) 50-325-40 MG tablet TAKE 1 TABLET TWICE DAILY AS NEEDED FOR HEADACHE.  [name] dexlansoprazole (DEXILANT) 60 MG capsule Take 1 capsule (60 mg total) by mouth daily.  . diazepam (VALIUM) 5 MG tablet Take 1 tablet (5 mg total) by mouth every 12 (twelve) hours as needed for anxiety.  . DULoxetine (CYMBALTA) 60 MG capsule Take 1 capsule (60 mg total) by mouth at bedtime.  [name] escitalopram (LEXAPRO) 20 MG tablet Take 1 tablet (20 mg total) by mouth daily.  . fludrocortisone (FLORINEF) 0.1 MG tablet TAKE 1 TABLET ONCE DAILY.  . fluticasone furoate-vilanterol (BREO ELLIPTA) 200-25 MCG/INH AEPB Inhale 1 puff into the lungs daily.  . furosemide (LASIX) 40 MG tablet Take 1 tablet (40 mg total) by mouth daily.  [name] HYDROcodone-acetaminophen (NORCO) 7.5-325 MG tablet TAKE 1 TABLET BY MOUTH 2 TIMES DAILY.  [name]  HYDROcodone-acetaminophen (NORCO) 7.5-325 MG tablet Take 1 tablet by mouth 2 (two) times daily.  [name] HYDROcodone-acetaminophen (NORCO) 7.5-325 MG tablet Take 1 tablet by mouth 2 (two) times daily.  [name] ipratropium (ATROVENT) 0.02 % nebulizer solution USE 1 VIAL IN NEBULIZER EVERY 4 HOURSE AS NEEDED FOR WHEEZING.  . levETIRAcetam (KEPPRA) 500 MG tablet TAKE (1) TABLET TWICE DAILY.  [name] Loratadine 10 MG CAPS Take 1 capsule (10 mg total) by mouth daily.  . methocarbamol (ROBAXIN) 500 MG tablet Take 1 tablet (500 mg total) by mouth every 8 (eight) hours as needed.  . midodrine (PROAMATINE) 5 MG tablet TAKE (1/2) TABLET THREE TIMES DAILY.  . nitroGLYCERIN (NITROSTAT) 0.4 MG SL tablet Place 1 tablet (0.4 mg total) under the tongue every 5 (five) minutes as needed for chest pain.  . ranitidine (ZANTAC) 150 MG tablet Take 1 tablet (150 mg total) by mouth at bedtime.  . rosuvastatin (CRESTOR) 10 MG tablet Take 1 tablet (10 mg total) by mouth daily.  [name] tobramycin (TOBREX) 0.3 % ophthalmic solution Place 2 drops into both eyes every 4 (four) hours.  [name] tolterodine (DETROL) 2 MG tablet Take 1 tablet (2 mg total) by mouth 2 (two) times daily.  . traZODone (DESYREL) 150 MG tablet Take 1 tablet (150 mg total) by mouth at bedtime.  . valACYclovir (VALTREX) 500 MG tablet Take 1 tablet (500 mg total) by mouth 2 (two) times daily.  . VENTOLIN HFA 108 (90 Base) MCG/ACT  inhaler INHALE 2 PUFFS EVERY 6 HOURS AS NEEDED.    Patient Active Problem List   Diagnosis Date Noted  . Diverticulitis of colon 08/24/2015  . Chest pain 11/29/2014  . Insomnia 09/05/2014  . Allergic rhinitis 09/05/2014  . Cervical spondylosis without myelopathy 12/19/2013    Class: Chronic  . Neural foraminal stenosis of cervical spine 12/19/2013  . Preop cardiovascular exam 11/24/2013  . Spinal stenosis in cervical region 09/19/2013  . Cervical radiculitis 09/19/2013  . Dysphagia 08/05/2013  . Lumbosacral spondylosis without myelopathy 11/16/2012   . Postlaminectomy syndrome, lumbar region 11/16/2012  . Sinus tachycardia 10/22/2011  . Chest pain, unspecified 09/30/2011  . Herpes simplex virus (HSV) infection 10/31/2008  . Hyperlipidemia with target LDL less than 100 10/31/2008  . Anxiety state 10/31/2008  . Depression 10/31/2008  . Essential hypertension 10/31/2008  . Asthma 10/31/2008  . GERD 10/31/2008  . SPINAL STENOSIS OF LUMBAR REGION 10/31/2008  . Myalgia and myositis 10/31/2008  . Osteoporosis 10/31/2008  . SPONDYLOLISTHESIS 10/31/2008  . SYNCOPE 10/31/2008  . Sleep apnea 10/31/2008  . HEAD TRAUMA, CLOSED 10/31/2008   Depression screen [facility] 2/9 02/12/2016 11/13/2015 08/24/2015 06/05/2015 04/23/2015  Decreased Interest 3 1 0 2 0  Down, Depressed, Hopeless 3 1 0 1 0  PHQ - 2 Score 6 2 0 3 0  Altered sleeping 3 1 - 3 -  Tired, decreased energy 3 1 - 3 -  Change in appetite 3 1 - 0 -  Feeling bad or failure about yourself  3 1 - 1 -  Trouble concentrating 3 2 - 3 -  Moving slowly or fidgety/restless 3 2 - 3 -  Suicidal thoughts 3 0 - 0 -  PHQ-9 Score 27 10 - 16 -  Some recent data might be hidden      Review of Systems  Constitutional: Positive for appetite change (poor appetite) and fatigue. Negative for chills and fever.  HENT: Negative.   Respiratory: Negative.   Cardiovascular: Positive for chest pain (nothing different then waht sh enormally has.). Negative for palpitations and leg swelling.  Gastrointestinal: Positive for constipation. Negative for abdominal pain, diarrhea, nausea and vomiting.  Genitourinary: Positive for decreased urine volume, difficulty urinating, flank pain (left flank) and hematuria (couple of weeks ago). Negative for frequency, pelvic pain, urgency, vaginal bleeding, vaginal discharge and vaginal pain.  Musculoskeletal: Positive for joint swelling (left shoulder). Negative for back pain and gait problem.  Neurological: Negative.   Psychiatric/Behavioral: Positive for dysphoric mood.  All  other systems reviewed and are negative.      Objective:   Physical Exam  Constitutional: She is oriented to person, place, and time. She appears well-developed and well-nourished. No distress.  HENT:  Head: Normocephalic and atraumatic.  Neck: Trachea normal, normal range of motion and full passive range of motion without pain. Neck supple. No JVD present. Carotid bruit is not present. No thyromegaly present.  Cardiovascular: Normal rate, regular rhythm, normal heart sounds and intact distal pulses.  Exam reveals no gallop and no friction rub.   Pulmonary/Chest: Effort normal and breath sounds normal. No respiratory distress. She has no wheezes.  Abdominal: Soft. Bowel sounds are normal. She exhibits no distension and no mass. There is no tenderness.  Genitourinary:  Genitourinary Comments: +ve CVA tenderness (left)  Musculoskeletal: Normal range of motion. She exhibits tenderness (right hip). She exhibits no edema or deformity.  FROM of left shoulder with pain on full abduction Point tenderness anteriorly Grips equal bil Motor strength and sensation  distally intact   Lymphadenopathy:    She has no cervical adenopathy.  Neurological: She is alert and oriented to person, place, and time. She has normal reflexes.  Skin: Skin is warm and dry.  Psychiatric: She has a normal mood and affect. Her behavior is normal. Judgment and thought content normal.    BP 125/84   Pulse 73   Temp 98 F (36.7 C) (Oral)   Ht 5' 1"  (1.549 m)   Wt 140 lb 6 oz (63.7 kg)   BMI 26.52 kg/m    DG right hip- osteoarthritis with joint space narrowing-Preliminary reading by Ronnald Collum, FNP  [facility] Left shoulder injection    Assessment & Plan:  1. Flank pain - Urinalysis, Complete  2. Pain of right hip joint - DG HIP UNILAT W OR W/O PELVIS 2-3 VIEWS RIGHT; Future  3. Dyspareunia in female Positional changes - estradiol (ESTRACE VAGINAL) 0.1 MG/GM vaginal cream; Place 1 Applicatorful vaginally at  bedtime.  Dispense: 42.5 g; Refill: 12  4. Herpes simplex virus (HSV) infection - valACYclovir (VALTREX) 500 MG tablet; Take 1 tablet (500 mg total) by mouth 2 (two) times daily.  Dispense: 180 tablet; Refill: 1  5. Primary insomnia Bed time routine - traZODone (DESYREL) 150 MG tablet; Take 1 tablet (150 mg total) by mouth at bedtime.  Dispense: 90 tablet; Refill: 1  6. Urinary urgency - tolterodine (DETROL) 2 MG tablet; Take 1 tablet (2 mg total) by mouth 2 (two) times daily.  Dispense: 180 tablet; Refill: 1 - dexlansoprazole (DEXILANT) 60 MG capsule; Take 1 capsule (60 mg total) by mouth daily.  Dispense: 90 capsule; Refill: 1  7. Hyperlipidemia with target LDL less than 100 Low fat diet - rosuvastatin (CRESTOR) 10 MG tablet; Take 1 tablet (10 mg total) by mouth daily.  Dispense: 90 tablet; Refill: 1 - Lipid panel  8. Gastroesophageal reflux disease with esophagitis Avoid spicy foods - ranitidine (ZANTAC) 150 MG tablet; Take 1 tablet (150 mg total) by mouth at bedtime.  Dispense: 90 tablet; Refill: 1  9. Cervical spondylosis without myelopathy  - methocarbamol (ROBAXIN) 500 MG tablet; Take 1 tablet (500 mg total) by mouth every 8 (eight) hours as needed.  Dispense: 90 tablet; Refill: 1  10. Seizures (HCC)  - levETIRAcetam (KEPPRA) 500 MG tablet; TAKE (1) TABLET TWICE DAILY.  Dispense: 180 tablet; Refill: 1  11. Essential hypertension Low salt diet, avoid adding extra salt to meals - furosemide (LASIX) 40 MG tablet; Take 1 tablet (40 mg total) by mouth daily.  Dispense: 90 tablet; Refill: 1 - CMP14+EGFR  12. Moderate episode of recurrent major depressive disorder ([place]) Stress management Added lamictal to meds- tried abilify in the past which made her worse - DULoxetine (CYMBALTA) 60 MG capsule; Take 1 capsule (60 mg total) by mouth at bedtime.  Dispense: 90 capsule; Refill: 1 - lamoTRIgine (LAMICTAL) 100 MG tablet; Take 1 tablet (100 mg total) by mouth daily.  Dispense: 30  tablet; Refill: 2  13. Anxiety state Stress management - busPIRone (BUSPAR) 10 MG tablet; Take 1 tablet (10 mg total) by mouth 2 (two) times daily.  Dispense: 180 tablet; Refill: 1  14. Acute cystitis with hematuria Take plenty of fluid to flush out bacetria in your bladder Empty bladder as soon as you feel the urge to urinate - ciprofloxacin (CIPRO) 500 MG tablet; Take 1 tablet (500 mg total) by mouth 2 (two) times daily.  Dispense: 20 tablet; Refill: 0 - fluconazole (DIFLUCAN) 150 MG tablet; Take 1  tablet (150 mg total) by mouth once.  Dispense: 1 tablet; Refill: 0  15. Acute pain of left shoulder Limit use for 3 days Ice BID - bupivacaine (MARCAINE) 0.25 % (with pres) injection 1 mL; Inject 1 mL into the articular space once. - methylPREDNISolone acetate (DEPO-MEDROL) injection 40 mg; Inject 1 mL (40 mg total) into the articular space once.  Flu shot administered Complete antibiotics as prescribed Labs pending Health maintenance reviewed Diet and exercise encouraged Continue all meds Follow up  in 3 months  Jari Favre, RN, BSN, [place], [place]

## 2016-02-14 LAB — URINE CULTURE

## 2016-02-26 ENCOUNTER — Other Ambulatory Visit: Payer: Self-pay | Admitting: Family Medicine

## 2016-02-26 NOTE — Telephone Encounter (Signed)
Please call in fioricet with 1 refills 

## 2016-02-27 NOTE — Telephone Encounter (Signed)
Rx called in 

## 2016-03-03 DIAGNOSIS — H4322 Crystalline deposits in vitreous body, left eye: Secondary | ICD-10-CM | POA: Diagnosis not present

## 2016-03-03 DIAGNOSIS — E119 Type 2 diabetes mellitus without complications: Secondary | ICD-10-CM | POA: Diagnosis not present

## 2016-03-03 DIAGNOSIS — H2513 Age-related nuclear cataract, bilateral: Secondary | ICD-10-CM | POA: Diagnosis not present

## 2016-03-03 DIAGNOSIS — H5203 Hypermetropia, bilateral: Secondary | ICD-10-CM | POA: Diagnosis not present

## 2016-03-03 LAB — HM DIABETES EYE EXAM

## 2016-03-06 ENCOUNTER — Ambulatory Visit (INDEPENDENT_AMBULATORY_CARE_PROVIDER_SITE_OTHER): Payer: Medicare Other | Admitting: Family

## 2016-03-06 ENCOUNTER — Encounter: Payer: Self-pay | Admitting: Family

## 2016-03-06 VITALS — BP 133/84 | HR 62 | Temp 97.8°F | Ht 61.0 in | Wt 137.8 lb

## 2016-03-06 DIAGNOSIS — R7303 Prediabetes: Secondary | ICD-10-CM | POA: Diagnosis not present

## 2016-03-06 LAB — BAYER DCA HB A1C WAIVED: HB A1C (BAYER DCA - WAIVED): 5.8 % (ref <7.0)

## 2016-03-06 NOTE — Patient Instructions (Signed)
Carbohydrate Counting for Diabetes Mellitus, Adult Carbohydrate counting is a method for keeping track of how many carbohydrates you eat. Eating carbohydrates naturally increases the amount of sugar (glucose) in the blood. Counting how many carbohydrates you eat helps keep your blood glucose within normal limits, which helps you manage your diabetes (diabetes mellitus). It is important to know how many carbohydrates you can safely have in each meal. This is different for every person. A diet and nutrition specialist (registered dietitian) can help you make a meal plan and calculate how many carbohydrates you should have at each meal and snack. Carbohydrates are found in the following foods:  Grains, such as breads and cereals.  Dried beans and soy products.  Starchy vegetables, such as potatoes, peas, and corn.  Fruit and fruit juices.  Milk and yogurt.  Sweets and snack foods, such as cake, cookies, candy, chips, and soft drinks. How do I count carbohydrates? There are two ways to count carbohydrates in food. You can use either of the methods or a combination of both. Reading "Nutrition Facts" on packaged food  The "Nutrition Facts" list is included on the labels of almost all packaged foods and beverages in the [place] It includes:  The serving size.  Information about nutrients in each serving, including the grams (g) of carbohydrate per serving. To use the "Nutrition Facts":  Decide how many servings you will have.  Multiply the number of servings by the number of carbohydrates per serving.  The resulting number is the total amount of carbohydrates that you will be having. Learning standard serving sizes of other foods  When you eat foods containing carbohydrates that are not packaged or do not include "Nutrition Facts" on the label, you need to measure the servings in order to count the amount of carbohydrates:  Measure the foods that you will eat with a food scale or measuring  cup, if needed.  Decide how many standard-size servings you will eat.  Multiply the number of servings by 15. Most carbohydrate-rich foods have about 15 g of carbohydrates per serving.  For example, if you eat 8 oz (170 g) of strawberries, you will have eaten 2 servings and 30 g of carbohydrates (2 servings x 15 g = 30 g).  For foods that have more than one food mixed, such as soups and casseroles, you must count the carbohydrates in each food that is included. The following list contains standard serving sizes of common carbohydrate-rich foods. Each of these servings has about 15 g of carbohydrates:   hamburger bun or  English muffin.   oz (15 mL) syrup.   oz (14 g) jelly.  1 slice of bread.  1 six-inch tortilla.  3 oz (85 g) cooked rice or pasta.  4 oz (113 g) cooked dried beans.  4 oz (113 g) starchy vegetable, such as peas, corn, or potatoes.  4 oz (113 g) hot cereal.  4 oz (113 g) mashed potatoes or  of a large baked potato.  4 oz (113 g) canned or frozen fruit.  4 oz (120 mL) fruit juice.  4-6 crackers.  6 chicken nuggets.  6 oz (170 g) unsweetened dry cereal.  6 oz (170 g) plain fat-free yogurt or yogurt sweetened with artificial sweeteners.  8 oz (240 mL) milk.  8 oz (170 g) fresh fruit or one small piece of fruit.  24 oz (680 g) popped popcorn. Example of carbohydrate counting Sample meal  3 oz (85 g) chicken breast.  6 oz (  170 g) brown rice.  4 oz (113 g) corn.  8 oz (240 mL) milk.  8 oz (170 g) strawberries with sugar-free whipped topping. Carbohydrate calculation 1. Identify the foods that contain carbohydrates:  Rice.  Corn.  Milk.  Strawberries. 2. Calculate how many servings you have of each food:  2 servings rice.  1 serving corn.  1 serving milk.  1 serving strawberries. 3. Multiply each number of servings by 15 g:  2 servings rice x 15 g = 30 g.  1 serving corn x 15 g = 15 g.  1 serving milk x 15 g = 15  g.  1 serving strawberries x 15 g = 15 g. 4. Add together all of the amounts to find the total grams of carbohydrates eaten:  30 g + 15 g + 15 g + 15 g = 75 g of carbohydrates total. This information is not intended to replace advice given to you by your health care provider. Make sure you discuss any questions you have with your health care provider. Document Released: 03/17/2005 Document Revised: 10/05/2015 Document Reviewed: 08/29/2015 Elsevier Interactive Patient Education  2017 [employer]  

## 2016-03-06 NOTE — Progress Notes (Signed)
   Subjective:    Patient ID: [name], female    DOB: [birth year], 60 y.o.   MRN: [id number]  HPI Pt presents to the office today to be checked for diabetes. PT states she had her eyes examined three days ago and was told she needed to get checked. Pt reports polydipsia. Pt has been told in the past that she was prediabetic. PT does not check her blood sugars at home, but has been on a low carb diet. Pt had a CMP drawn on 02/12/16 and had a glucose level of 81.   Review of Systems  Endocrine: Positive for polydipsia.  All other systems reviewed and are negative.      Objective:   Physical Exam  Constitutional: She is oriented to person, place, and time. She appears well-developed and well-nourished. No distress.  HENT:  Head: Normocephalic and atraumatic.  Eyes: Pupils are equal, round, and reactive to light.  Neck: Normal range of motion. Neck supple. No thyromegaly present.  Cardiovascular: Normal rate, regular rhythm, normal heart sounds and intact distal pulses.   No murmur heard. Pulmonary/Chest: Effort normal and breath sounds normal. No respiratory distress. She has no wheezes.  Abdominal: Soft. Bowel sounds are normal. She exhibits no distension. There is no tenderness.  Musculoskeletal: Normal range of motion. She exhibits no edema or tenderness.  Neurological: She is alert and oriented to person, place, and time.  Skin: Skin is warm and dry.  Psychiatric: She has a normal mood and affect. Her behavior is normal. Judgment and thought content normal.  Vitals reviewed.     BP 133/84   Pulse 62   Temp 97.8 F (36.6 C) (Oral)   Ht 5\' 1"  (1.549 m)   Wt 137 lb 12.8 oz (62.5 kg)   BMI 26.04 kg/m      Assessment & Plan:  1. Prediabetes -Low carb diet -Labs pending -Unsure if this is diabetes since blood glucose was 81 last blood drawn -Keep follow up with PCP - Bayer DCA Hb A1c Waived  , Jannifer Rodney

## 2016-03-11 ENCOUNTER — Other Ambulatory Visit: Payer: Self-pay | Admitting: Family Medicine

## 2016-03-11 ENCOUNTER — Other Ambulatory Visit: Payer: Self-pay | Admitting: Nurse Practitioner

## 2016-03-11 DIAGNOSIS — M47812 Spondylosis without myelopathy or radiculopathy, cervical region: Secondary | ICD-10-CM

## 2016-03-11 DIAGNOSIS — R3915 Urgency of urination: Secondary | ICD-10-CM

## 2016-03-11 DIAGNOSIS — B009 Herpesviral infection, unspecified: Secondary | ICD-10-CM

## 2016-03-12 ENCOUNTER — Encounter: Payer: Self-pay | Admitting: Internal Medicine

## 2016-03-17 ENCOUNTER — Other Ambulatory Visit: Payer: Self-pay | Admitting: Nurse Practitioner

## 2016-03-17 DIAGNOSIS — R7303 Prediabetes: Secondary | ICD-10-CM

## 2016-03-18 ENCOUNTER — Ambulatory Visit (INDEPENDENT_AMBULATORY_CARE_PROVIDER_SITE_OTHER): Payer: Medicare Other | Admitting: Internal Medicine

## 2016-03-18 ENCOUNTER — Encounter: Payer: Self-pay | Admitting: Internal Medicine

## 2016-03-18 VITALS — BP 110/66 | HR 79 | Ht 61.0 in | Wt 143.4 lb

## 2016-03-18 DIAGNOSIS — R55 Syncope and collapse: Secondary | ICD-10-CM

## 2016-03-18 NOTE — Progress Notes (Signed)
HPI [name] returns today for followup. She is a 60 year old woman with a history of recurrent syncope, hypertension, and palpitations.  She has severe autonomic dysfunction. Her blood pressures routinely go into the 70's. At other times they go up to 170. She has moved back to [place] from [place] She notes peripheral edema and abdominal swelling.  She has been told she probably has DM. She has non-cardiac chest pain.  Allergies  Allergen Reactions  . Codeine Other (See Comments)    "I will have a heart attack."  . Morphine And Related Other (See Comments)    "It will cause me to have a heart attack."  . Lyrica [Pregabalin] Swelling and Other (See Comments)    Weight gain  . Neurontin [Gabapentin] Swelling     Current Outpatient Prescriptions  Medication Sig Dispense Refill  . acyclovir ointment (ZOVIRAX) 5 % Apply 1 application topically every 3 (three) hours. 30 g 1  . albuterol (PROVENTIL HFA;VENTOLIN HFA) 108 (90 Base) MCG/ACT inhaler Inhale 2 puffs into the lungs every 6 (six) hours as needed for wheezing or shortness of breath.    [name] albuterol (PROVENTIL) (2.5 MG/3ML) 0.083% nebulizer solution USE 1 VIAL IN NEBULIZER EVERY 4 HOURS AS NEEDED FOR WHEEZING. 180 mL 3  . busPIRone (BUSPAR) 10 MG tablet Take 1 tablet (10 mg total) by mouth 2 (two) times daily. 180 tablet 1  . butalbital-acetaminophen-caffeine (FIORICET, ESGIC) 50-325-40 MG tablet TAKE 1 TABLET TWICE DAILY AS NEEDED FOR HEADACHE. 15 tablet 1  . dexlansoprazole (DEXILANT) 60 MG capsule Take 1 capsule (60 mg total) by mouth daily. 90 capsule 1  . diazepam (VALIUM) 5 MG tablet TAKE 1 TABLET EVERY 12 HOURS AS NEEDED FOR ANXIETY. 60 tablet 1  . DULoxetine (CYMBALTA) 60 MG capsule Take 1 capsule (60 mg total) by mouth at bedtime. 90 capsule 1  . escitalopram (LEXAPRO) 20 MG tablet Take 1 tablet (20 mg total) by mouth daily. 90 tablet 1  . estradiol (ESTRACE VAGINAL) 0.1 MG/GM vaginal cream Place 1  Applicatorful vaginally at bedtime. 42.5 g 12  . fludrocortisone (FLORINEF) 0.1 MG tablet TAKE 1 TABLET ONCE DAILY. 30 tablet 0  . fluticasone furoate-vilanterol (BREO ELLIPTA) 200-25 MCG/INH AEPB Inhale 1 puff into the lungs daily. 180 each 1  . furosemide (LASIX) 40 MG tablet Take 1 tablet (40 mg total) by mouth daily. 90 tablet 1  . HYDROcodone-acetaminophen (NORCO) 7.5-325 MG tablet Take 1 tablet by mouth 2 (two) times daily. 60 tablet 0  . ipratropium (ATROVENT) 0.02 % nebulizer solution USE 1 VIAL IN NEBULIZER EVERY 4 HOURSE AS NEEDED FOR WHEEZING. 150 mL 3  . lamoTRIgine (LAMICTAL) 100 MG tablet Take 1 tablet (100 mg total) by mouth daily. 30 tablet 2  . levETIRAcetam (KEPPRA) 500 MG tablet TAKE (1) TABLET TWICE DAILY. 180 tablet 1  . Loratadine 10 MG CAPS Take 1 capsule (10 mg total) by mouth daily. 90 each 3  . methocarbamol (ROBAXIN) 500 MG tablet Take 500 mg by mouth every 8 (eight) hours as needed for muscle spasms.    . midodrine (PROAMATINE) 5 MG tablet TAKE (1/2) TABLET THREE TIMES DAILY. 45 tablet 1  . nitroGLYCERIN (NITROSTAT) 0.4 MG SL tablet Place 1 tablet (0.4 mg total) under the tongue every 5 (five) minutes as needed for chest pain. 25 tablet 0  . ranitidine (ZANTAC) 150 MG tablet Take 1 tablet (150 mg total) by mouth at bedtime. 90 tablet 1  . rosuvastatin (CRESTOR) 10 MG  tablet Take 1 tablet (10 mg total) by mouth daily. 90 tablet 1  . tobramycin (TOBREX) 0.3 % ophthalmic solution Place 2 drops into both eyes every 4 (four) hours. 5 mL 0  . tolterodine (DETROL) 2 MG tablet Take 1 tablet (2 mg total) by mouth 2 (two) times daily. 180 tablet 1  . traZODone (DESYREL) 150 MG tablet Take 1 tablet (150 mg total) by mouth at bedtime. 90 tablet 1  . valACYclovir (VALTREX) 500 MG tablet Take 1 tablet (500 mg total) by mouth 2 (two) times daily. 180 tablet 1   No current facility-administered medications for this visit.      Past Medical History:  Diagnosis Date  . Anginal pain  (HCC)    last time   . Anxiety   . Arthritis    RHEUMATOID  . Asthma   . Bipolar 1 disorder (HCC)   . COPD (chronic obstructive pulmonary disease) (HCC)   . Coronary artery disease    reported hx of "MI";  Echo 2009 with normal LVF;  Myoview 05/2011: no ischemia  . Depression   . Dyslipidemia   . Esophageal stricture   . Fibromyalgia   . GERD (gastroesophageal reflux disease)   . H/O hiatal hernia   . Head injury, unspecified   . Herpes simplex infection   . History of kidney stones   . Hyperlipidemia   . Hypertension   . Insomnia   . Myocardial infarction   . Osteoporosis   . Pneumonia    hx  . Seizures (HCC)    last 3 weeks ago- prescribed keppra -never took didnt like side effects read about online  . Shortness of breath   . Sleep apnea    ? neg  . Spinal stenosis of lumbar region   . Spondylolisthesis   . Status post placement of implantable loop recorder   . Supraventricular tachycardia (HCC)   . Syncope and collapse    s/p ILR; no arhythmogenic cause identified  . UTI (lower urinary tract infection)     ROS:   All systems reviewed and negative except as noted in the HPI.   Past Surgical History:  Procedure Laterality Date  . BACK SURGERY    . BREAST SURGERY     lumpectomy  . CHOLECYSTECTOMY    . CYSTOSCOPY     stone  . DOPPLER ECHOCARDIOGRAPHY  2009  . head up tilt table testing  06/15/2007   [name]  . HEMORRHOID SURGERY    . insertion of implatable loop recorder  08/11/2007   [name]  . POSTERIOR CERVICAL FUSION/FORAMINOTOMY N/A 12/19/2013   Procedure: RIGHT C3-4.C4-5 AND C5-6 FORAMINOTOMIES;  Surgeon: Kerrin Champagne, MD;  Location: [facility];  Service: Orthopedics;  Laterality: N/A;  . TUBAL LIGATION       Family History  Problem Relation Age of Onset  . Heart attack Father   . Cancer Father   . Mental illness Father   . Cancer Mother   . Mental illness Mother   . Heart attack Brother     stents  . Colon cancer Maternal Aunt   .  Stomach cancer Neg Hx      Social History   Social History  . Marital status: Single    Spouse name: N/A  . Number of children: N/A  . Years of education: N/A   Occupational History  . Not on file.   Social History Main Topics  . Smoking status: Never Smoker  . Smokeless tobacco:  Never Used  . Alcohol use No  . Drug use: No  . Sexual activity: Not on file   Other Topics Concern  . Not on file   Social History Narrative   Patient lives at home with her brother.   Patient is on disability.    Patient has 8th grade education.    Patient has 6 children, 20 grand-chilren, and 3 great-grand children.      BP 110/66   Pulse 79   Ht 5\' 1"  (1.549 m)   Wt 143 lb 6.4 oz (65 kg)   BMI 27.10 kg/m   Physical Exam:  Well appearing middle-aged woman,NAD HEENT: Unremarkable Neck:  6 cm JVD, no thyromegally Back:  No CVA tenderness Lungs:  Clear with no wheezes, rales, or rhonchi. HEART:  Regular rate rhythm, grade 1/6 systolic murmur, no rubs, no clicks Abd:  soft, positive bowel sounds, no organomegally, no rebound, no guarding Ext:  2 plus pulses, no edema, no cyanosis, no clubbing Skin:  No rashes no nodules Neuro:  CN II through XII intact, motor grossly intact   Assess/Plan: 1. Autonomic dysfunction - her pressures have been reasonably well controlled. I have asked her to take her diuretic and blood pressure meds when she is up and to take the midodrine when her pressure is low. 2. Dyslipidemia - she will continue Zetia. 3. HTN - her blood pressure today is good. I asked her to increase her physical activity.  .D.

## 2016-03-18 NOTE — Patient Instructions (Signed)

## 2016-03-19 ENCOUNTER — Telehealth: Payer: Self-pay | Admitting: Nurse Practitioner

## 2016-03-25 ENCOUNTER — Other Ambulatory Visit: Payer: Self-pay | Admitting: Nurse Practitioner

## 2016-03-25 DIAGNOSIS — I252 Old myocardial infarction: Secondary | ICD-10-CM | POA: Diagnosis not present

## 2016-03-25 DIAGNOSIS — S63616A Unspecified sprain of right little finger, initial encounter: Secondary | ICD-10-CM | POA: Diagnosis not present

## 2016-03-25 DIAGNOSIS — E7800 Pure hypercholesterolemia, unspecified: Secondary | ICD-10-CM | POA: Diagnosis not present

## 2016-03-25 DIAGNOSIS — J45909 Unspecified asthma, uncomplicated: Secondary | ICD-10-CM | POA: Diagnosis not present

## 2016-03-25 DIAGNOSIS — K219 Gastro-esophageal reflux disease without esophagitis: Secondary | ICD-10-CM | POA: Diagnosis not present

## 2016-03-25 DIAGNOSIS — G40909 Epilepsy, unspecified, not intractable, without status epilepticus: Secondary | ICD-10-CM | POA: Diagnosis not present

## 2016-03-25 DIAGNOSIS — M47812 Spondylosis without myelopathy or radiculopathy, cervical region: Secondary | ICD-10-CM

## 2016-03-25 DIAGNOSIS — M79644 Pain in right finger(s): Secondary | ICD-10-CM | POA: Diagnosis not present

## 2016-03-25 DIAGNOSIS — I10 Essential (primary) hypertension: Secondary | ICD-10-CM | POA: Diagnosis not present

## 2016-03-25 DIAGNOSIS — Z79899 Other long term (current) drug therapy: Secondary | ICD-10-CM | POA: Diagnosis not present

## 2016-03-25 DIAGNOSIS — X500XXA Overexertion from strenuous movement or load, initial encounter: Secondary | ICD-10-CM | POA: Diagnosis not present

## 2016-03-25 DIAGNOSIS — F319 Bipolar disorder, unspecified: Secondary | ICD-10-CM | POA: Diagnosis not present

## 2016-03-27 ENCOUNTER — Other Ambulatory Visit: Payer: Self-pay | Admitting: Nurse Practitioner

## 2016-03-27 NOTE — Telephone Encounter (Signed)
Pt need refill on Percoset 7.5 Please review and advise

## 2016-03-27 NOTE — Telephone Encounter (Signed)
She was given rx for percoet to have filled 12/31, 04/29/16 and in February- does pharmacy have rx

## 2016-03-27 NOTE — Telephone Encounter (Signed)
Pt notified that RX was printed and pt was given refills for 3 mths Pt will try to find RX

## 2016-04-08 ENCOUNTER — Other Ambulatory Visit: Payer: Self-pay | Admitting: Nurse Practitioner

## 2016-04-08 DIAGNOSIS — M47812 Spondylosis without myelopathy or radiculopathy, cervical region: Secondary | ICD-10-CM

## 2016-04-11 ENCOUNTER — Ambulatory Visit (INDEPENDENT_AMBULATORY_CARE_PROVIDER_SITE_OTHER): Payer: Medicare Other | Admitting: Nurse Practitioner

## 2016-04-11 ENCOUNTER — Encounter: Payer: Self-pay | Admitting: Nurse Practitioner

## 2016-04-11 VITALS — BP 125/87 | HR 74 | Temp 97.9°F | Ht 61.0 in | Wt 142.0 lb

## 2016-04-11 DIAGNOSIS — G43111 Migraine with aura, intractable, with status migrainosus: Secondary | ICD-10-CM | POA: Diagnosis not present

## 2016-04-11 DIAGNOSIS — F411 Generalized anxiety disorder: Secondary | ICD-10-CM

## 2016-04-11 MED ORDER — ACYCLOVIR 5 % EX OINT: 1.0000 "application " | TOPICAL_OINTMENT | CUTANEOUS | 1 refills | Status: DC

## 2016-04-11 MED ORDER — KETOROLAC TROMETHAMINE 60 MG/2ML IM SOLN
60.0000 mg | Freq: Once | INTRAMUSCULAR | Status: AC
  Administered 2016-04-11: 60 mg via INTRAMUSCULAR

## 2016-04-11 MED ORDER — DIAZEPAM 5 MG PO TABS: ORAL_TABLET | ORAL | 1 refills | Status: DC

## 2016-04-11 NOTE — Patient Instructions (Signed)

## 2016-04-11 NOTE — Progress Notes (Signed)
   Subjective:    Patient ID: [name], female    DOB: [birth year], 61 y.o.   MRN: [id number]  HPI Patient in today c/o migraine for 3 days which has caused some nausea. Fioricet is n it helping as usual. She has been under a lot of stress which she thinks is contributing to headache.    Review of Systems  Constitutional: Negative for chills and fever.  HENT: Negative for congestion, sinus pain, sore throat and trouble swallowing.   Eyes: Positive for photophobia.  Respiratory: Negative.   Cardiovascular: Negative.   Gastrointestinal: Positive for nausea. Negative for constipation, diarrhea and vomiting.  Musculoskeletal: Negative.   Neurological: Negative.   Psychiatric/Behavioral: Negative.   All other systems reviewed and are negative.      Objective:   Physical Exam  Constitutional: She is oriented to person, place, and time. She appears well-developed and well-nourished.  Cardiovascular: Normal rate and regular rhythm.   Pulmonary/Chest: Effort normal and breath sounds normal.  Abdominal: Soft. Bowel sounds are normal.  Neurological: She is alert and oriented to person, place, and time. She has normal reflexes. No cranial nerve deficit.  Skin: Skin is warm.  Psychiatric: She has a normal mood and affect. Her behavior is normal. Judgment and thought content normal.   BP 125/87   Pulse 74   Temp 97.9 F (36.6 C) (Oral)   Ht 5\' 1"  (1.549 m)   Wt 142 lb (64.4 kg)   BMI 26.83 kg/m        Assessment & Plan:

## 2016-04-11 NOTE — Addendum Note (Signed)
Addended by: Bennie Pierini on: 04/11/2016 04:14 PM   Modules accepted: Orders

## 2016-04-29 ENCOUNTER — Ambulatory Visit (INDEPENDENT_AMBULATORY_CARE_PROVIDER_SITE_OTHER): Payer: Medicare Other

## 2016-04-29 ENCOUNTER — Ambulatory Visit (INDEPENDENT_AMBULATORY_CARE_PROVIDER_SITE_OTHER): Payer: Medicare Other | Admitting: Physician Assistant

## 2016-04-29 ENCOUNTER — Encounter: Payer: Self-pay | Admitting: Physician Assistant

## 2016-04-29 VITALS — BP 103/70 | HR 92 | Temp 97.3°F | Ht 61.0 in | Wt 142.0 lb

## 2016-04-29 DIAGNOSIS — M47812 Spondylosis without myelopathy or radiculopathy, cervical region: Secondary | ICD-10-CM

## 2016-04-29 DIAGNOSIS — M545 Low back pain: Secondary | ICD-10-CM

## 2016-04-29 DIAGNOSIS — W010XXA Fall on same level from slipping, tripping and stumbling without subsequent striking against object, initial encounter: Secondary | ICD-10-CM | POA: Diagnosis not present

## 2016-04-29 DIAGNOSIS — W19XXXA Unspecified fall, initial encounter: Secondary | ICD-10-CM

## 2016-04-29 MED ORDER — HYDROCODONE-ACETAMINOPHEN 7.5-325 MG PO TABS: 1.0000 | ORAL_TABLET | Freq: Four times a day (QID) | ORAL | 0 refills | Status: DC | PRN

## 2016-04-29 MED ORDER — PREDNISONE 10 MG (48) PO TBPK: ORAL_TABLET | Freq: Every day | ORAL | 0 refills | Status: DC

## 2016-04-30 ENCOUNTER — Other Ambulatory Visit: Payer: Self-pay

## 2016-04-30 MED ORDER — GLUCOSE BLOOD VI STRP
ORAL_STRIP | 12 refills | Status: DC
Start: 2016-04-30 — End: 2016-05-26

## 2016-05-01 NOTE — Patient Instructions (Signed)
Contusion A contusion is a deep bruise. Contusions are the result of a blunt injury to tissues and muscle fibers under the skin. The injury causes bleeding under the skin. The skin overlying the contusion may turn blue, purple, or yellow. Minor injuries will give you a painless contusion, but more severe contusions may stay painful and swollen for a few weeks. What are the causes? This condition is usually caused by a blow, trauma, or direct force to an area of the body. What are the signs or symptoms? Symptoms of this condition include:  Swelling of the injured area.  Pain and tenderness in the injured area.  Discoloration. The area may have redness and then turn blue, purple, or yellow. How is this diagnosed? This condition is diagnosed based on a physical exam and medical history. An X-ray, CT scan, or MRI may be needed to determine if there are any associated injuries, such as broken bones (fractures). How is this treated? Specific treatment for this condition depends on what area of the body was injured. In general, the best treatment for a contusion is resting, icing, applying pressure to (compression), and elevating the injured area. This is often called the RICE strategy. Over-the-counter anti-inflammatory medicines may also be recommended for pain control. Follow these instructions at home:  Rest the injured area.  If directed, apply ice to the injured area:  Put ice in a plastic bag.  Place a towel between your skin and the bag.  Leave the ice on for 20 minutes, 2-3 times per day.  If directed, apply light compression to the injured area using an elastic bandage. Make sure the bandage is not wrapped too tightly. Remove and reapply the bandage as directed by your health care provider.  If possible, raise (elevate) the injured area above the level of your heart while you are sitting or lying down.  Take over-the-counter and prescription medicines only as told by your health  care provider. Contact a health care provider if:  Your symptoms do not improve after several days of treatment.  Your symptoms get worse.  You have difficulty moving the injured area. Get help right away if:  You have severe pain.  You have numbness in a hand or foot.  Your hand or foot turns pale or cold. This information is not intended to replace advice given to you by your health care provider. Make sure you discuss any questions you have with your health care provider. Document Released: 12/25/2004 Document Revised: 07/26/2015 Document Reviewed: 08/02/2014 Elsevier Interactive Patient Education  2017 [employer] Inc.  

## 2016-05-01 NOTE — Progress Notes (Signed)
BP 103/70   Pulse 92   Temp 97.3 F (36.3 C) (Oral)   Ht 5\' 1"  (1.549 m)   Wt 142 lb (64.4 kg)   BMI 26.83 kg/m    Subjective:    Patient ID: [name], female    DOB: [birth year], 61 y.o.   MRN: [id number]  HPI: [name] is a 61 y.o. female presenting on 04/29/2016 for Fall 04-26-1969 and hurt gluteal )  Patient fell proximally 5 days ago. She slipped and ended up landing on her left gluteus primarily that the pain has reached across the entire sacroiliac area. She has not had any weakness or numbness in her legs. She has been able to walk with his been a significant amount of pain. She is very stiff after she sits still for very long. She has had to sit to the opposite side. Known prior lumbar surgery with hardware in place. Denies crepitus.  Past Medical History:  Diagnosis Date  . Anginal pain (HCC)    last time   . Anxiety   . Arthritis    RHEUMATOID  . Asthma   . Bipolar 1 disorder (HCC)   . COPD (chronic obstructive pulmonary disease) (HCC)   . Coronary artery disease    reported hx of "MI";  Echo 2009 with normal LVF;  Myoview 05/2011: no ischemia  . Depression   . Dyslipidemia   . Esophageal stricture   . Fibromyalgia   . GERD (gastroesophageal reflux disease)   . H/O hiatal hernia   . Head injury, unspecified   . Herpes simplex infection   . History of kidney stones   . Hyperlipidemia   . Hypertension   . Insomnia   . Myocardial infarction   . Osteoporosis   . Pneumonia    hx  . Seizures (HCC)    last 3 weeks ago- prescribed keppra -never took didnt like side effects read about online  . Shortness of breath   . Sleep apnea    ? neg  . Spinal stenosis of lumbar region   . Spondylolisthesis   . Status post placement of implantable loop recorder   . Supraventricular tachycardia (HCC)   . Syncope and collapse    s/p ILR; no arhythmogenic cause identified  . UTI (lower urinary tract infection)    Relevant past medical, surgical, family and  social history reviewed and updated as indicated. Interim medical history since our last visit reviewed. Allergies and medications reviewed and updated. DATA REVIEWED: CHART IN EPIC  Social History   Social History  . Marital status: Single    Spouse name: N/A  . Number of children: N/A  . Years of education: N/A   Occupational History  . Not on file.   Social History Main Topics  . Smoking status: Never Smoker  . Smokeless tobacco: Never Used  . Alcohol use No  . Drug use: No  . Sexual activity: Not on file   Other Topics Concern  . Not on file   Social History Narrative   Patient lives at home with her brother.   Patient is on disability.    Patient has 8th grade education.    Patient has 6 children, 20 grand-chilren, and 3 great-grand children.     Past Surgical History:  Procedure Laterality Date  . BACK SURGERY    . BREAST SURGERY     lumpectomy  . CHOLECYSTECTOMY    . CYSTOSCOPY     stone  . DOPPLER ECHOCARDIOGRAPHY  2009  . head up tilt table testing  06/15/2007   [name]  . HEMORRHOID SURGERY    . insertion of implatable loop recorder  08/11/2007   [name]  . POSTERIOR CERVICAL FUSION/FORAMINOTOMY N/A 12/19/2013   Procedure: RIGHT C3-4.C4-5 AND C5-6 FORAMINOTOMIES;  Surgeon: Kerrin Champagne, MD;  Location: [facility] OR;  Service: Orthopedics;  Laterality: N/A;  . TUBAL LIGATION      Family History  Problem Relation Age of Onset  . Heart attack Father   . Cancer Father   . Mental illness Father   . Cancer Mother   . Mental illness Mother   . Heart attack Brother     stents  . Colon cancer Maternal Aunt   . Stomach cancer Neg Hx     Review of Systems  Constitutional: Negative.   HENT: Negative.   Eyes: Negative.   Respiratory: Negative.   Gastrointestinal: Negative.   Genitourinary: Negative.     Allergies as of 04/29/2016      Reactions   Codeine Other (See Comments)   "I will have a heart attack."   Morphine And Related Other (See  Comments)   "It will cause me to have a heart attack."   Lyrica [pregabalin] Swelling, Other (See Comments)   Weight gain   Neurontin [gabapentin] Swelling      Medication List       Accurate as of 04/29/16 11:59 PM. Always use your most recent med list.          acyclovir ointment 5 % Commonly known as:  ZOVIRAX Apply 1 application topically every 3 (three) hours.   albuterol 108 (90 Base) MCG/ACT inhaler Commonly known as:  PROVENTIL HFA;VENTOLIN HFA Inhale 2 puffs into the lungs every 6 (six) hours as needed for wheezing or shortness of breath.   albuterol (2.5 MG/3ML) 0.083% nebulizer solution Commonly known as:  PROVENTIL USE 1 VIAL IN NEBULIZER EVERY 4 HOURS AS NEEDED FOR WHEEZING.   busPIRone 10 MG tablet Commonly known as:  BUSPAR Take 1 tablet (10 mg total) by mouth 2 (two) times daily.   butalbital-acetaminophen-caffeine 50-325-40 MG tablet Commonly known as:  FIORICET, ESGIC TAKE 1 TABLET TWICE DAILY AS NEEDED FOR HEADACHE.   dexlansoprazole 60 MG capsule Commonly known as:  DEXILANT Take 1 capsule (60 mg total) by mouth daily.   diazepam 5 MG tablet Commonly known as:  VALIUM TAKE 1 TABLET EVERY 12 HOURS AS NEEDED FOR ANXIETY.   DULoxetine 60 MG capsule Commonly known as:  CYMBALTA Take 1 capsule (60 mg total) by mouth at bedtime.   escitalopram 20 MG tablet Commonly known as:  LEXAPRO Take 1 tablet (20 mg total) by mouth daily.   estradiol 0.1 MG/GM vaginal cream Commonly known as:  ESTRACE VAGINAL Place 1 Applicatorful vaginally at bedtime.   fludrocortisone 0.1 MG tablet Commonly known as:  FLORINEF TAKE 1 TABLET ONCE DAILY.   fluticasone furoate-vilanterol 200-25 MCG/INH Aepb Commonly known as:  BREO ELLIPTA Inhale 1 puff into the lungs daily.   furosemide 40 MG tablet Commonly known as:  LASIX Take 1 tablet (40 mg total) by mouth daily.   HYDROcodone-acetaminophen 7.5-325 MG tablet Commonly known as:  NORCO Take 1 tablet by mouth 2  (two) times daily.   HYDROcodone-acetaminophen 7.5-325 MG tablet Commonly known as:  NORCO Take 1-2 tablets by mouth every 6 (six) hours as needed for moderate pain.   ipratropium 0.02 % nebulizer solution Commonly known as:  ATROVENT USE 1 VIAL IN  NEBULIZER EVERY 4 HOURSE AS NEEDED FOR WHEEZING.   lamoTRIgine 100 MG tablet Commonly known as:  LAMICTAL Take 1 tablet (100 mg total) by mouth daily.   levETIRAcetam 500 MG tablet Commonly known as:  KEPPRA TAKE (1) TABLET TWICE DAILY.   Loratadine 10 MG Caps Take 1 capsule (10 mg total) by mouth daily.   methocarbamol 500 MG tablet Commonly known as:  ROBAXIN Take 500 mg by mouth every 8 (eight) hours as needed for muscle spasms.   midodrine 5 MG tablet Commonly known as:  PROAMATINE TAKE (1/2) TABLET THREE TIMES DAILY.   nitroGLYCERIN 0.4 MG SL tablet Commonly known as:  NITROSTAT Place 1 tablet (0.4 mg total) under the tongue every 5 (five) minutes as needed for chest pain.   predniSONE 10 MG (48) Tbpk tablet Commonly known as:  STERAPRED UNI-PAK 48 TAB Take by mouth daily.   ranitidine 150 MG tablet Commonly known as:  ZANTAC Take 1 tablet (150 mg total) by mouth at bedtime.   rosuvastatin 10 MG tablet Commonly known as:  CRESTOR Take 1 tablet (10 mg total) by mouth daily.   tolterodine 2 MG tablet Commonly known as:  DETROL Take 1 tablet (2 mg total) by mouth 2 (two) times daily.   traZODone 150 MG tablet Commonly known as:  DESYREL Take 1 tablet (150 mg total) by mouth at bedtime.   valACYclovir 500 MG tablet Commonly known as:  VALTREX Take 1 tablet (500 mg total) by mouth 2 (two) times daily.          Objective:    BP 103/70   Pulse 92   Temp 97.3 F (36.3 C) (Oral)   Ht 5\' 1"  (1.549 m)   Wt 142 lb (64.4 kg)   BMI 26.83 kg/m   Allergies  Allergen Reactions  . Codeine Other (See Comments)    "I will have a heart attack."  . Morphine And Related Other (See Comments)    "It will cause me to  have a heart attack."  . Lyrica [Pregabalin] Swelling and Other (See Comments)    Weight gain  . Neurontin [Gabapentin] Swelling    Wt Readings from Last 3 Encounters:  04/29/16 142 lb (64.4 kg)  04/11/16 142 lb (64.4 kg)  03/18/16 143 lb 6.4 oz (65 kg)    Physical Exam  Constitutional: She is oriented to person, place, and time. She appears well-developed and well-nourished.  HENT:  Head: Normocephalic and atraumatic.  Eyes: Conjunctivae and EOM are normal. Pupils are equal, round, and reactive to light.  Cardiovascular: Normal rate, regular rhythm, normal heart sounds and intact distal pulses.   Pulmonary/Chest: Effort normal and breath sounds normal.  Abdominal: Soft. Bowel sounds are normal.  Musculoskeletal: She exhibits tenderness. She exhibits no deformity.       Right shoulder: She exhibits decreased range of motion.       Lumbar back: She exhibits decreased range of motion, tenderness, pain and spasm. She exhibits no swelling and no edema.       Back:  Neurological: She is alert and oriented to person, place, and time. She has normal reflexes.  Skin: Skin is warm and dry. No rash noted.  Psychiatric: She has a normal mood and affect. Her behavior is normal. Judgment and thought content normal.    Results for orders placed or performed in visit on 03/13/16  HM DIABETES EYE EXAM  Result Value Ref Range   HM Diabetic Eye Exam Retinopathy (A) No Retinopathy  Assessment & Plan:      Continue all other maintenance medications as listed above.  Follow up plan: No Follow-up on file.  Orders Placed This Encounter  Procedures  . DG Pelvis 1-2 Views    Educational handout given for contusion   Remus Loffler PA-C [facility] [address]  [place], [place] [address] [phone]   05/01/2016, 9:17 PM

## 2016-05-03 ENCOUNTER — Other Ambulatory Visit: Payer: Self-pay | Admitting: Nurse Practitioner

## 2016-05-03 DIAGNOSIS — R569 Unspecified convulsions: Secondary | ICD-10-CM

## 2016-05-03 DIAGNOSIS — F331 Major depressive disorder, recurrent, moderate: Secondary | ICD-10-CM

## 2016-05-03 DIAGNOSIS — G4700 Insomnia, unspecified: Secondary | ICD-10-CM

## 2016-05-03 DIAGNOSIS — J309 Allergic rhinitis, unspecified: Secondary | ICD-10-CM

## 2016-05-03 DIAGNOSIS — B009 Herpesviral infection, unspecified: Secondary | ICD-10-CM

## 2016-05-03 DIAGNOSIS — R3915 Urgency of urination: Secondary | ICD-10-CM

## 2016-05-03 DIAGNOSIS — I10 Essential (primary) hypertension: Secondary | ICD-10-CM

## 2016-05-19 ENCOUNTER — Ambulatory Visit (INDEPENDENT_AMBULATORY_CARE_PROVIDER_SITE_OTHER): Payer: Medicare Other | Admitting: Nurse Practitioner

## 2016-05-19 ENCOUNTER — Encounter: Payer: Self-pay | Admitting: Nurse Practitioner

## 2016-05-19 VITALS — BP 131/88 | HR 92 | Temp 98.5°F | Ht 61.0 in | Wt 150.0 lb

## 2016-05-19 DIAGNOSIS — R05 Cough: Secondary | ICD-10-CM | POA: Diagnosis not present

## 2016-05-19 DIAGNOSIS — J309 Allergic rhinitis, unspecified: Secondary | ICD-10-CM | POA: Diagnosis not present

## 2016-05-19 DIAGNOSIS — R000 Tachycardia, unspecified: Secondary | ICD-10-CM | POA: Diagnosis not present

## 2016-05-19 DIAGNOSIS — F5101 Primary insomnia: Secondary | ICD-10-CM | POA: Diagnosis not present

## 2016-05-19 DIAGNOSIS — W19XXXA Unspecified fall, initial encounter: Secondary | ICD-10-CM

## 2016-05-19 DIAGNOSIS — R059 Cough, unspecified: Secondary | ICD-10-CM

## 2016-05-19 DIAGNOSIS — F411 Generalized anxiety disorder: Secondary | ICD-10-CM

## 2016-05-19 DIAGNOSIS — G4733 Obstructive sleep apnea (adult) (pediatric): Secondary | ICD-10-CM | POA: Diagnosis not present

## 2016-05-19 DIAGNOSIS — M47812 Spondylosis without myelopathy or radiculopathy, cervical region: Secondary | ICD-10-CM

## 2016-05-19 DIAGNOSIS — F3342 Major depressive disorder, recurrent, in full remission: Secondary | ICD-10-CM | POA: Diagnosis not present

## 2016-05-19 DIAGNOSIS — K2100 Gastro-esophageal reflux disease with esophagitis, without bleeding: Secondary | ICD-10-CM

## 2016-05-19 DIAGNOSIS — E785 Hyperlipidemia, unspecified: Secondary | ICD-10-CM

## 2016-05-19 DIAGNOSIS — I10 Essential (primary) hypertension: Secondary | ICD-10-CM

## 2016-05-19 MED ORDER — FUROSEMIDE 40 MG PO TABS: ORAL_TABLET | ORAL | 5 refills | Status: DC

## 2016-05-19 MED ORDER — BENZONATATE 100 MG PO CAPS: 100.0000 mg | ORAL_CAPSULE | Freq: Three times a day (TID) | ORAL | 0 refills | Status: DC | PRN

## 2016-05-19 MED ORDER — GLUCOSE BLOOD VI STRP: ORAL_STRIP | 12 refills | Status: DC

## 2016-05-19 MED ORDER — DIAZEPAM 5 MG PO TABS: ORAL_TABLET | ORAL | 1 refills | Status: DC

## 2016-05-19 MED ORDER — ONETOUCH DELICA LANCETS 33G MISC: 1 refills | Status: DC

## 2016-05-19 MED ORDER — METHYLPREDNISOLONE ACETATE 80 MG/ML IJ SUSP
80.0000 mg | Freq: Once | INTRAMUSCULAR | Status: AC
  Administered 2016-05-19: 80 mg via INTRAMUSCULAR

## 2016-05-19 MED ORDER — HYDROCODONE-ACETAMINOPHEN 7.5-325 MG PO TABS: 1.0000 | ORAL_TABLET | Freq: Two times a day (BID) | ORAL | 0 refills | Status: DC

## 2016-05-19 NOTE — Progress Notes (Signed)
Subjective:    Patient ID: [name], female    DOB: 10-Nov-1955, 61 y.o.   MRN: [id number]  HPI  Patient comes in today for follow up of chronic medical problems- she is c/o cough that started Friday with fever and body aches- the fever and body aches have resolved but still coughing. Pain assessment: Pain location- lower back worst but pain is actually all over Pain on scale of 1-10- 8/10 Frequency- What increases pain-daily What makes pain Better- rest Effects on ADL - some days she cannot do anything Any change in general medical condition-none  Current medications- norco 7.5/325 BID prn Effectiveness of current meds-cuts pain down but does not completely relieve pain Adverse reactions form pain meds-none  Pill count performed-Yes Urine drug screen- No Was the [facility] reviewed- yes  If yes were their any concerning findings? - no concerns  Outpatient Encounter Prescriptions as of 05/19/2016  Medication Sig  . acyclovir ointment (ZOVIRAX) 5 % Apply 1 application topically every 3 (three) hours.  [name] albuterol (PROVENTIL HFA;VENTOLIN HFA) 108 (90 Base) MCG/ACT inhaler Inhale 2 puffs into the lungs every 6 (six) hours as needed for wheezing or shortness of breath.  [name] albuterol (PROVENTIL) (2.5 MG/3ML) 0.083% nebulizer solution USE 1 VIAL IN NEBULIZER EVERY 4 HOURS AS NEEDED FOR WHEEZING.  Virgel Bouquet ELLIPTA 200-25 MCG/INH AEPB INHALE 1 PUFF DAILY.  . busPIRone (BUSPAR) 10 MG tablet Take 1 tablet (10 mg total) by mouth 2 (two) times daily.  . butalbital-acetaminophen-caffeine (FIORICET, ESGIC) 50-325-40 MG tablet TAKE 1 TABLET TWICE DAILY AS NEEDED FOR HEADACHE.  [name] DEXILANT 60 MG capsule TAKE 1 CAPSULE BY MOUTH ONCE DAILY  . diazepam (VALIUM) 5 MG tablet TAKE 1 TABLET EVERY 12 HOURS AS NEEDED FOR ANXIETY.  . DULoxetine (CYMBALTA) 60 MG capsule Take 1 capsule (60 mg total) by mouth at bedtime.  [name] escitalopram (LEXAPRO) 20 MG tablet TAKE (1) TABLET BY MOUTH ONCE DAILY.  [name] estradiol  (ESTRACE VAGINAL) 0.1 MG/GM vaginal cream Place 1 Applicatorful vaginally at bedtime.  . fludrocortisone (FLORINEF) 0.1 MG tablet TAKE 1 TABLET ONCE DAILY.  . furosemide (LASIX) 40 MG tablet TAKE (1) TABLET BY MOUTH ONCE DAILY.  [name] glucose blood test strip Use as instructed  . HYDROcodone-acetaminophen (NORCO) 7.5-325 MG tablet Take 1 tablet by mouth 2 (two) times daily.  [name] HYDROcodone-acetaminophen (NORCO) 7.5-325 MG tablet Take 1-2 tablets by mouth every 6 (six) hours as needed for moderate pain.  [name] ipratropium (ATROVENT) 0.02 % nebulizer solution USE 1 VIAL IN NEBULIZER EVERY 4 HOURSE AS NEEDED FOR WHEEZING.  [name] lamoTRIgine (LAMICTAL) 100 MG tablet TAKE (1) TABLET BY MOUTH ONCE DAILY.  [name] levETIRAcetam (KEPPRA) 500 MG tablet TAKE (1) TABLET TWICE DAILY.  [name] Loratadine 10 MG CAPS Take 1 capsule (10 mg total) by mouth daily.  . methocarbamol (ROBAXIN) 500 MG tablet Take 500 mg by mouth every 8 (eight) hours as needed for muscle spasms.  . midodrine (PROAMATINE) 5 MG tablet TAKE (1/2) TABLET THREE TIMES DAILY.  . nitroGLYCERIN (NITROSTAT) 0.4 MG SL tablet Place 1 tablet (0.4 mg total) under the tongue every 5 (five) minutes as needed for chest pain.  . ranitidine (ZANTAC) 150 MG tablet Take 1 tablet (150 mg total) by mouth at bedtime.  . rosuvastatin (CRESTOR) 10 MG tablet Take 1 tablet (10 mg total) by mouth daily.  [name] tolterodine (DETROL) 2 MG tablet TAKE (1) TABLET TWICE DAILY.  . traZODone (DESYREL) 150 MG tablet TAKE (1) TABLET DAILY AT BEDTIME.  [name]  valACYclovir (VALTREX) 500 MG tablet TAKE (1) TABLET TWICE DAILY.    Patient Active Problem List   Diagnosis Date Noted  . Diverticulitis of colon 08/24/2015  . Chest pain 11/29/2014  . Insomnia 09/05/2014  . Allergic rhinitis 09/05/2014  . Cervical spondylosis without myelopathy 12/19/2013    Class: Chronic  . Neural foraminal stenosis of cervical spine 12/19/2013  . Preop cardiovascular exam 11/24/2013  . Spinal stenosis in cervical region  09/19/2013  . Cervical radiculitis 09/19/2013  . Dysphagia 08/05/2013  . Lumbosacral spondylosis without myelopathy 11/16/2012  . Postlaminectomy syndrome, lumbar region 11/16/2012  . Sinus tachycardia 10/22/2011  . Chest pain, unspecified 09/30/2011  . Herpes simplex virus (HSV) infection 10/31/2008  . Hyperlipidemia with target LDL less than 100 10/31/2008  . Anxiety state 10/31/2008  . Depression 10/31/2008  . Essential hypertension 10/31/2008  . Asthma 10/31/2008  . GERD 10/31/2008  . SPINAL STENOSIS OF LUMBAR REGION 10/31/2008  . Myalgia and myositis 10/31/2008  . Osteoporosis 10/31/2008  . SPONDYLOLISTHESIS 10/31/2008  . SYNCOPE 10/31/2008  . Sleep apnea 10/31/2008  . HEAD TRAUMA, CLOSED 10/31/2008      Review of Systems  Constitutional: Negative.   HENT: Negative.   Cardiovascular: Negative.   Genitourinary: Negative.   Psychiatric/Behavioral: Negative.   All other systems reviewed and are negative.      Objective:   Physical Exam  Constitutional: She is oriented to person, place, and time. She appears well-developed and well-nourished. No distress.  HENT:  Nose: Nose normal.  Mouth/Throat: Oropharynx is clear and moist.  Eyes: EOM are normal.  Neck: Trachea normal, normal range of motion and full passive range of motion without pain. Neck supple. No JVD present. Carotid bruit is not present. No thyromegaly present.  Cardiovascular: Normal rate, regular rhythm, normal heart sounds and intact distal pulses.  Exam reveals no gallop and no friction rub.   No murmur heard. Pulmonary/Chest: Effort normal and breath sounds normal.  Abdominal: Soft. Bowel sounds are normal. She exhibits no distension and no mass. There is no tenderness.  Musculoskeletal: Normal range of motion.  Lymphadenopathy:    She has no cervical adenopathy.  Neurological: She is alert and oriented to person, place, and time. She has normal reflexes.  Skin: Skin is warm and dry.  Psychiatric:  She has a normal mood and affect. Her behavior is normal. Judgment and thought content normal.    BP 131/88   Pulse 92   Temp 98.5 F (36.9 C) (Oral)   Ht 5\' 1"  (1.549 m)   Wt 150 lb (68 kg)   BMI 28.34 kg/m        Assessment & Plan:   1. Essential hypertension   2. Acute allergic rhinitis, unspecified seasonality, unspecified trigger   3. Obstructive sleep apnea syndrome   4. Gastroesophageal reflux disease with esophagitis   5. Cervical spondylosis without myelopathy   6. Anxiety state   7. Recurrent major depressive disorder, in full remission (HCC)   8. Sinus tachycardia   9. GAD (generalized anxiety disorder)   10. Cough   11. Hyperlipidemia with target LDL less than 100   12. Primary insomnia    Meds ordered this encounter  Medications  . furosemide (LASIX) 40 MG tablet    Sig: TAKE (1) TABLET BY MOUTH ONCE DAILY.    Dispense:  30 tablet    Refill:  5    Order Specific Question:   Supervising Provider    Answer:   VINCENT, CAROL L [[id number]]  .  diazepam (VALIUM) 5 MG tablet    Sig: TAKE 1 TABLET EVERY 12 HOURS AS NEEDED FOR ANXIETY.    Dispense:  60 tablet    Refill:  1    Order Specific Question:   Supervising Provider    Answer:   VINCENT, CAROL L [[id number]]  . benzonatate (TESSALON PERLES) 100 MG capsule    Sig: Take 1 capsule (100 mg total) by mouth 3 (three) times daily as needed for cough.    Dispense:  20 capsule    Refill:  0    Order Specific Question:   Supervising Provider    Answer:   VINCENT, CAROL L [[id number]]  . methylPREDNISolone acetate (DEPO-MEDROL) injection 80 mg  . HYDROcodone-acetaminophen (NORCO) 7.5-325 MG tablet    Sig: Take 1 tablet by mouth 2 (two) times daily.    Dispense:  60 tablet    Refill:  0    Order Specific Question:   Supervising Provider    Answer:   VINCENT, CAROL L [[id number]]  . glucose blood (ONETOUCH VERIO) test strip    Sig: Check blood sugar daily-- dx E11.9    Dispense:  100 each    Refill:  12    Order Specific Question:    Supervising Provider    Answer:   VINCENT, CAROL L [[id number]]  . ONETOUCH DELICA LANCETS 33G MISC    Sig: 1 daily to keep diary of blood sugars-- Dx E11.9    Dispense:  100 each    Refill:  1    Order Specific Question:   Supervising Provider    Answer:   VINCENT, CAROL L [[id number]]    1. Take meds as prescribed 2. Use a cool mist humidifier especially during the winter months and when heat has been humid. 3. Use saline nose sprays frequently 4. Saline irrigations of the nose can be very helpful if done frequently.  * 4X daily for 1 week*  * Use of a nettie pot can be helpful with this. Follow directions with this* 5. Drink plenty of fluids 6. Keep thermostat turn down low 7.For any cough or congestion  Use plain Mucinex- regular strength or max strength is fine   * Children- consult with Pharmacist for dosing 8. For fever or aces or pains- take tylenol or ibuprofen appropriate for age and weight.  * for fevers greater than 101 orally you may alternate ibuprofen and tylenol every  3 hours.   Labs pending Low fat diet reviewed Follow up in 3 months  [name] Daphine Deutscher, FNP

## 2016-05-19 NOTE — Patient Instructions (Signed)
Cough, Adult Introduction A cough helps to clear your throat and lungs. A cough may last only 2-3 weeks (acute), or it may last longer than 8 weeks (chronic). Many different things can cause a cough. A cough may be a sign of an illness or another medical condition. Follow these instructions at home:  Pay attention to any changes in your cough.  Take medicines only as told by your doctor.  If you were prescribed an antibiotic medicine, take it as told by your doctor. Do not stop taking it even if you start to feel better.  Talk with your doctor before you try using a cough medicine.  Drink enough fluid to keep your pee (urine) clear or pale yellow.  If the air is dry, use a cold steam vaporizer or humidifier in your home.  Stay away from things that make you cough at work or at home.  If your cough is worse at night, try using extra pillows to raise your head up higher while you sleep.  Do not smoke, and try not to be around smoke. If you need help quitting, ask your doctor.  Do not have caffeine.  Do not drink alcohol.  Rest as needed. Contact a doctor if:  You have new problems (symptoms).  You cough up yellow fluid (pus).  Your cough does not get better after 2-3 weeks, or your cough gets worse.  Medicine does not help your cough and you are not sleeping well.  You have pain that gets worse or pain that is not helped with medicine.  You have a fever.  You are losing weight and you do not know why.  You have night sweats. Get help right away if:  You cough up blood.  You have trouble breathing.  Your heartbeat is very fast. This information is not intended to replace advice given to you by your health care provider. Make sure you discuss any questions you have with your health care provider. Document Released: 11/28/2010 Document Revised: 08/23/2015 Document Reviewed: 05/24/2014  2017 Elsevier  

## 2016-05-20 ENCOUNTER — Other Ambulatory Visit: Payer: Self-pay | Admitting: Nurse Practitioner

## 2016-05-20 ENCOUNTER — Telehealth: Payer: Self-pay | Admitting: Nurse Practitioner

## 2016-05-20 NOTE — Telephone Encounter (Signed)
What is the name of the medication? [name] (FIORICET, ESGICbutalbital-acetaminophen-caffeine (FIORICET, ESGIC.   Have you contacted your pharmacy to request a refill? no, she was just seen  Which pharmacy would you like this sent to? [facility]   Patient notified that their request is being sent to the clinical staff for review and that they should receive a call once it is complete. If they do not receive a call within 24 hours they can check with their pharmacy or our office.

## 2016-05-20 NOTE — Telephone Encounter (Signed)
Has refills but too early?

## 2016-05-20 NOTE — Telephone Encounter (Signed)
Called [facility] and verified that rx had not been called in on 2-5. Called in today with 2 refills

## 2016-05-20 NOTE — Telephone Encounter (Signed)
Please review

## 2016-05-20 NOTE — Telephone Encounter (Signed)
According to chart was just approved on 05/15/16 with 2 refills- check with pharmacy to see if has refills-should have been called in

## 2016-05-21 NOTE — Telephone Encounter (Signed)
Corrected at pharmacy. Rx was not phoned in on 2-5

## 2016-05-21 NOTE — Telephone Encounter (Signed)
Then she will have to wait until it is time to refill

## 2016-05-26 ENCOUNTER — Ambulatory Visit (INDEPENDENT_AMBULATORY_CARE_PROVIDER_SITE_OTHER): Payer: Medicare Other | Admitting: Family Medicine

## 2016-05-26 ENCOUNTER — Encounter: Payer: Self-pay | Admitting: Family Medicine

## 2016-05-26 VITALS — BP 134/87 | HR 69 | Temp 97.0°F | Ht 61.0 in | Wt 153.0 lb

## 2016-05-26 DIAGNOSIS — R399 Unspecified symptoms and signs involving the genitourinary system: Secondary | ICD-10-CM | POA: Diagnosis not present

## 2016-05-26 DIAGNOSIS — R1030 Lower abdominal pain, unspecified: Secondary | ICD-10-CM

## 2016-05-26 DIAGNOSIS — N898 Other specified noninflammatory disorders of vagina: Secondary | ICD-10-CM | POA: Diagnosis not present

## 2016-05-26 DIAGNOSIS — B9689 Other specified bacterial agents as the cause of diseases classified elsewhere: Secondary | ICD-10-CM | POA: Diagnosis not present

## 2016-05-26 DIAGNOSIS — N760 Acute vaginitis: Secondary | ICD-10-CM | POA: Diagnosis not present

## 2016-05-26 LAB — URINALYSIS, COMPLETE
Bilirubin, UA: NEGATIVE
GLUCOSE, UA: NEGATIVE
Ketones, UA: NEGATIVE
Leukocytes, UA: NEGATIVE
Nitrite, UA: NEGATIVE
PROTEIN UA: NEGATIVE
RBC, UA: NEGATIVE
Specific Gravity, UA: >1.03 — ABNORMAL HIGH (ref 1.005–1.030)
Urobilinogen, Ur: 0.2 mg/dL (ref 0.2–1.0)
pH, UA: 5 (ref 5.0–7.5)

## 2016-05-26 LAB — MICROSCOPIC EXAMINATION
BACTERIA UA: NONE SEEN
RBC MICROSCOPIC, UA: NONE SEEN /HPF (ref 0–?)
Renal Epithel, UA: NONE SEEN /hpf

## 2016-05-26 LAB — WET PREP FOR TRICH, YEAST, CLUE
Clue Cell Exam: POSITIVE — AB
Trichomonas Exam: NEGATIVE
YEAST EXAM: NEGATIVE

## 2016-05-26 MED ORDER — METRONIDAZOLE 500 MG PO TABS: 500.0000 mg | ORAL_TABLET | Freq: Two times a day (BID) | ORAL | 0 refills | Status: DC

## 2016-05-26 NOTE — Progress Notes (Signed)
Subjective:  Patient ID: [name], female    DOB: 1956-03-03  Age: 61 y.o. MRN: [id number]  CC: Abdominal Pain (pt here today c/o lower abdominal pain and also some flank pain)   HPI ANZAL COFFEY presents for Onset 3 days ago of suprapubic pain described as sharp and constant and severe. It is getting worse. It is associated with heavy sweats multiple times a day for the last 3 days. Although she has no chest pain or angina equivalent she has had a heart attack in the past that had prominent sweats associated. On those occasions she had chest pain. Patient says the symptoms today are different from her usual kidney symptoms. She is not having dysuria currently. No urinary frequency. The discharge is white and it does have an odor.   History [name] has a past medical history of Anginal pain (HCC); Anxiety; Arthritis; Asthma; Bipolar 1 disorder (HCC); COPD (chronic obstructive pulmonary disease) (HCC); Coronary artery disease; Depression; Dyslipidemia; Esophageal stricture; Fibromyalgia; GERD (gastroesophageal reflux disease); H/O hiatal hernia; Head injury, unspecified; Herpes simplex infection; History of kidney stones; Hyperlipidemia; Hypertension; Insomnia; Myocardial infarction; Osteoporosis; Pneumonia; Seizures (HCC); Shortness of breath; Sleep apnea; Spinal stenosis of lumbar region; Spondylolisthesis; Status post placement of implantable loop recorder; Supraventricular tachycardia (HCC); Syncope and collapse; and UTI (lower urinary tract infection).   She has a past surgical history that includes insertion of implatable loop recorder (08/11/2007); head up tilt table testing (06/15/2007); Back surgery; doppler echocardiography (2009); Tubal ligation; Cholecystectomy; Cystoscopy; Hemorrhoid surgery; Breast surgery; and Posterior cervical fusion/foraminotomy (N/A, 12/19/2013).   Her family history includes Cancer in her father and mother; Colon cancer in her maternal aunt; Heart  attack in her brother and father; Mental illness in her father and mother.She reports that she has never smoked. She has never used smokeless tobacco. She reports that she does not drink alcohol or use drugs.    ROS Review of Systems  Constitutional: Negative for activity change, appetite change and fever.  HENT: Negative for congestion, rhinorrhea and sore throat.   Eyes: Negative for visual disturbance.  Cardiovascular: Negative for chest pain and palpitations.  Gastrointestinal: Positive for abdominal pain. Negative for diarrhea and nausea.  Genitourinary: Positive for pelvic pain and vaginal discharge. Negative for dysuria.  Musculoskeletal: Negative for arthralgias and myalgias.    Objective:  BP 134/87   Pulse 69   Temp 97 F (36.1 C) (Oral)   Ht 5\' 1"  (1.549 m)   Wt 153 lb (69.4 kg)   BMI 28.91 kg/m   BP Readings from Last 3 Encounters:  05/26/16 134/87  05/19/16 131/88  04/29/16 103/70    Wt Readings from Last 3 Encounters:  05/26/16 153 lb (69.4 kg)  05/19/16 150 lb (68 kg)  04/29/16 142 lb (64.4 kg)     Physical Exam  Constitutional: She is oriented to person, place, and time. She appears well-developed and well-nourished. No distress.  HENT:  Head: Normocephalic and atraumatic.  Eyes: Conjunctivae are normal. Pupils are equal, round, and reactive to light.  Neck: Normal range of motion. Neck supple. No thyromegaly present.  Cardiovascular: Normal rate, regular rhythm and normal heart sounds.   No murmur heard. Pulmonary/Chest: Effort normal and breath sounds normal. No respiratory distress. She has no wheezes. She has no rales.  Abdominal: Soft. Bowel sounds are normal. She exhibits distension. She exhibits no mass. There is tenderness (marked bilateral suprapubic to hypogastric). There is no rebound and no guarding.  Musculoskeletal: Normal range of motion.  Lymphadenopathy:    She has no cervical adenopathy.  Neurological: She is alert and oriented to  person, place, and time.  Skin: Skin is warm and dry.  Psychiatric: She has a normal mood and affect. Her behavior is normal. Judgment and thought content normal.    Dg Chest 2 View  Result Date: 12/12/2013 CLINICAL DATA:  Asthma.  Cough. EXAM: CHEST  2 VIEW COMPARISON:  04/01/2013. FINDINGS: The cardiac silhouette, mediastinal and hilar contours are within normal limits and stable. A loop recorder is again noted on the left. Streaky bibasilar scarring and subsegmental atelectasis but no infiltrates, edema or effusions. The bony thorax is intact. IMPRESSION: Minimal bibasilar scarring and subsegmental atelectasis but no infiltrates, edema or effusions. Electronically Signed   By: Loralie Champagne M.D.   On: 12/12/2013 13:20    Assessment & Plan:   Katelin was seen today for abdominal pain.  Diagnoses and all orders for this visit:  Lower abdominal pain  UTI symptoms -     Urinalysis, Complete  Vaginal discharge -     WET PREP FOR TRICH, YEAST, CLUE  Bacterial vaginosis  Other orders -     metroNIDAZOLE (FLAGYL) 500 MG tablet; Take 1 tablet (500 mg total) by mouth 2 (two) times daily.      I have discontinued Ms. Riggenbach's benzonatate. I am also having her start on metroNIDAZOLE. Additionally, I am having her maintain her midodrine, fludrocortisone, ipratropium, albuterol, nitroGLYCERIN, estradiol, rosuvastatin, ranitidine, Loratadine, DULoxetine, busPIRone, albuterol, methocarbamol, acyclovir ointment, butalbital-acetaminophen-caffeine, BREO ELLIPTA, escitalopram, traZODone, levETIRAcetam, tolterodine, lamoTRIgine, DEXILANT, valACYclovir, furosemide, diazepam, HYDROcodone-acetaminophen, glucose blood, and ONETOUCH DELICA LANCETS 33G.  Allergies as of 05/26/2016      Reactions   Codeine Other (See Comments)   "I will have a heart attack."   Morphine And Related Other (See Comments)   "It will cause me to have a heart attack."   Lyrica [pregabalin] Swelling, Other (See Comments)     Weight gain   Neurontin [gabapentin] Swelling      Medication List       Accurate as of 05/26/16  4:32 PM. Always use your most recent med list.          acyclovir ointment 5 % Commonly known as:  ZOVIRAX Apply 1 application topically every 3 (three) hours.   albuterol 108 (90 Base) MCG/ACT inhaler Commonly known as:  PROVENTIL HFA;VENTOLIN HFA Inhale 2 puffs into the lungs every 6 (six) hours as needed for wheezing or shortness of breath.   albuterol (2.5 MG/3ML) 0.083% nebulizer solution Commonly known as:  PROVENTIL USE 1 VIAL IN NEBULIZER EVERY 4 HOURS AS NEEDED FOR WHEEZING.   BREO ELLIPTA 200-25 MCG/INH Aepb Generic drug:  fluticasone furoate-vilanterol INHALE 1 PUFF DAILY.   busPIRone 10 MG tablet Commonly known as:  BUSPAR Take 1 tablet (10 mg total) by mouth 2 (two) times daily.   butalbital-acetaminophen-caffeine 50-325-40 MG tablet Commonly known as:  FIORICET, ESGIC TAKE 1 TABLET TWICE DAILY AS NEEDED FOR HEADACHE.   DEXILANT 60 MG capsule Generic drug:  dexlansoprazole TAKE 1 CAPSULE BY MOUTH ONCE DAILY   diazepam 5 MG tablet Commonly known as:  VALIUM TAKE 1 TABLET EVERY 12 HOURS AS NEEDED FOR ANXIETY.   DULoxetine 60 MG capsule Commonly known as:  CYMBALTA Take 1 capsule (60 mg total) by mouth at bedtime.   escitalopram 20 MG tablet Commonly known as:  LEXAPRO TAKE (1) TABLET BY MOUTH ONCE DAILY.   estradiol 0.1 MG/GM vaginal cream Commonly known as:  ESTRACE VAGINAL Place 1 Applicatorful vaginally at bedtime.   fludrocortisone 0.1 MG tablet Commonly known as:  FLORINEF TAKE 1 TABLET ONCE DAILY.   furosemide 40 MG tablet Commonly known as:  LASIX TAKE (1) TABLET BY MOUTH ONCE DAILY.   glucose blood test strip Commonly known as:  ONETOUCH VERIO Check blood sugar daily-- dx E11.9   HYDROcodone-acetaminophen 7.5-325 MG tablet Commonly known as:  NORCO Take 1 tablet by mouth 2 (two) times daily.   ipratropium 0.02 % nebulizer  solution Commonly known as:  ATROVENT USE 1 VIAL IN NEBULIZER EVERY 4 HOURSE AS NEEDED FOR WHEEZING.   lamoTRIgine 100 MG tablet Commonly known as:  LAMICTAL TAKE (1) TABLET BY MOUTH ONCE DAILY.   levETIRAcetam 500 MG tablet Commonly known as:  KEPPRA TAKE (1) TABLET TWICE DAILY.   Loratadine 10 MG Caps Take 1 capsule (10 mg total) by mouth daily.   methocarbamol 500 MG tablet Commonly known as:  ROBAXIN Take 500 mg by mouth every 8 (eight) hours as needed for muscle spasms.   metroNIDAZOLE 500 MG tablet Commonly known as:  FLAGYL Take 1 tablet (500 mg total) by mouth 2 (two) times daily.   midodrine 5 MG tablet Commonly known as:  PROAMATINE TAKE (1/2) TABLET THREE TIMES DAILY.   nitroGLYCERIN 0.4 MG SL tablet Commonly known as:  NITROSTAT Place 1 tablet (0.4 mg total) under the tongue every 5 (five) minutes as needed for chest pain.   ONETOUCH DELICA LANCETS 33G Misc 1 daily to keep diary of blood sugars-- Dx E11.9   ranitidine 150 MG tablet Commonly known as:  ZANTAC Take 1 tablet (150 mg total) by mouth at bedtime.   rosuvastatin 10 MG tablet Commonly known as:  CRESTOR Take 1 tablet (10 mg total) by mouth daily.   tolterodine 2 MG tablet Commonly known as:  DETROL TAKE (1) TABLET TWICE DAILY.   traZODone 150 MG tablet Commonly known as:  DESYREL TAKE (1) TABLET DAILY AT BEDTIME.   valACYclovir 500 MG tablet Commonly known as:  VALTREX TAKE (1) TABLET TWICE DAILY.        Follow-up: Return if symptoms worsen or fail to improve.  Mechele Claude, M.D.

## 2016-05-29 ENCOUNTER — Other Ambulatory Visit: Payer: Self-pay | Admitting: Nurse Practitioner

## 2016-05-29 DIAGNOSIS — F329 Major depressive disorder, single episode, unspecified: Secondary | ICD-10-CM

## 2016-05-29 DIAGNOSIS — F411 Generalized anxiety disorder: Secondary | ICD-10-CM

## 2016-05-29 DIAGNOSIS — F32A Depression, unspecified: Secondary | ICD-10-CM

## 2016-05-30 ENCOUNTER — Other Ambulatory Visit: Payer: Self-pay | Admitting: Nurse Practitioner

## 2016-05-30 MED ORDER — OXYBUTYNIN CHLORIDE 5 MG PO TABS: 5.0000 mg | ORAL_TABLET | Freq: Two times a day (BID) | ORAL | 5 refills | Status: DC

## 2016-05-30 NOTE — Progress Notes (Unsigned)
Had to change detrol LA to ditropan due [facility]- will need to take BID

## 2016-05-30 NOTE — Progress Notes (Signed)
Left detailed message on patients voicemail about rx change and to take BID

## 2016-06-02 ENCOUNTER — Other Ambulatory Visit: Payer: Self-pay | Admitting: Cardiology

## 2016-06-10 ENCOUNTER — Encounter: Payer: Self-pay | Admitting: Family Medicine

## 2016-06-10 ENCOUNTER — Ambulatory Visit (INDEPENDENT_AMBULATORY_CARE_PROVIDER_SITE_OTHER): Payer: Medicare Other | Admitting: Family Medicine

## 2016-06-10 VITALS — BP 139/85 | HR 75 | Ht 61.0 in | Wt 155.0 lb

## 2016-06-10 DIAGNOSIS — B372 Candidiasis of skin and nail: Secondary | ICD-10-CM

## 2016-06-10 MED ORDER — MICONAZOLE NITRATE 2 % EX CREA: 1.0000 "application " | TOPICAL_CREAM | Freq: Two times a day (BID) | CUTANEOUS | 0 refills | Status: DC

## 2016-06-10 NOTE — Progress Notes (Signed)
Subjective:  Patient ID: [name], female    DOB: 04/03/1955  Age: 61 y.o. MRN: [id number]  CC: Vaginitis (pt hree today c/o red bumps in the vaginal area that was painful and has now started itching)   HPI [name] presents for sx as above for several days. Pain is moderate. Itching is "unbearable." No vaginal DC. No odor.    History [name] has a past medical history of Anginal pain (HCC); Anxiety; Arthritis; Asthma; Bipolar 1 disorder (HCC); COPD (chronic obstructive pulmonary disease) (HCC); Coronary artery disease; Depression; Dyslipidemia; Esophageal stricture; Fibromyalgia; GERD (gastroesophageal reflux disease); H/O hiatal hernia; Head injury, unspecified; Herpes simplex infection; History of kidney stones; Hyperlipidemia; Hypertension; Insomnia; Myocardial infarction; Osteoporosis; Pneumonia; Seizures (HCC); Shortness of breath; Sleep apnea; Spinal stenosis of lumbar region; Spondylolisthesis; Status post placement of implantable loop recorder; Supraventricular tachycardia (HCC); Syncope and collapse; and UTI (lower urinary tract infection).   [name] has a past surgical history that includes insertion of implatable loop recorder (08/11/2007); head up tilt table testing (06/15/2007); Back surgery; doppler echocardiography (2009); Tubal ligation; Cholecystectomy; Cystoscopy; Hemorrhoid surgery; Breast surgery; and Posterior cervical fusion/foraminotomy (N/A, 12/19/2013).   [name] family history includes Cancer in [name] father and mother; Colon cancer in [name] maternal aunt; Heart attack in [name] brother and father; Mental illness in [name] father and mother.[name] reports that [name] has never smoked. [name] has never used smokeless tobacco. [name] reports that [name] does not drink alcohol or use drugs.    ROS Review of Systems  Constitutional: Negative for activity change, appetite change and fever.  HENT: Negative for congestion, rhinorrhea and sore throat.   Eyes: Negative for visual  disturbance.  Respiratory: Negative for cough and shortness of breath.   Cardiovascular: Negative for chest pain and palpitations.  Gastrointestinal: Negative for abdominal pain, diarrhea and nausea.  Genitourinary: Negative for dysuria.  Musculoskeletal: Negative for arthralgias and myalgias.    Objective:  BP 139/85   Pulse 75   Ht 5\' 1"  (1.549 m)   Wt 155 lb (70.3 kg)   BMI 29.29 kg/m   BP Readings from Last 3 Encounters:  06/10/16 139/85  05/26/16 134/87  05/19/16 131/88    Wt Readings from Last 3 Encounters:  06/10/16 155 lb (70.3 kg)  05/26/16 153 lb (69.4 kg)  05/19/16 150 lb (68 kg)     Physical Exam  Constitutional: [name] is oriented to person, place, and time. [name] appears well-developed and well-nourished. No distress.  Cardiovascular: Normal rate and regular rhythm.   Pulmonary/Chest: Breath sounds normal.  Neurological: [name] is alert and oriented to person, place, and time.  Skin: Skin is warm and dry.  The vulva and perineum have mild erythema with few satellites (      Assessment & Plan:   [name] was seen today for vaginitis.  Diagnoses and all orders for this visit:  Yeast dermatitis  Other orders -     miconazole (MICOTIN) 2 % cream; Apply 1 application topically 2 (two) times daily.       I am having [name] start on miconazole. I am also having [name] maintain [name] midodrine, ipratropium, albuterol, nitroGLYCERIN, estradiol, rosuvastatin, ranitidine, Loratadine, DULoxetine, albuterol, methocarbamol, butalbital-acetaminophen-caffeine, BREO ELLIPTA, escitalopram, traZODone, levETIRAcetam, tolterodine, lamoTRIgine, DEXILANT, valACYclovir, furosemide, diazepam, HYDROcodone-acetaminophen, glucose blood, ONETOUCH DELICA LANCETS 33G, metroNIDAZOLE, acyclovir ointment, busPIRone, oxybutynin, and fludrocortisone.  Allergies as of 06/10/2016      Reactions   Codeine Other (See Comments)   "I will have a heart attack."  Morphine And Related Other  (See Comments)   "It will cause me to have a heart attack."   Ambien [zolpidem Tartrate] Nausea And Vomiting   Lyrica [pregabalin] Swelling, Other (See Comments)   Weight gain   Neurontin [gabapentin] Swelling      Medication List       Accurate as of 06/10/16 11:59 PM. Always use your most recent med list.          acyclovir ointment 5 % Commonly known as:  ZOVIRAX APPLY TO AFFECTED AREA EVERY 3 HOURS   albuterol 108 (90 Base) MCG/ACT inhaler Commonly known as:  PROVENTIL HFA;VENTOLIN HFA Inhale 2 puffs into the lungs every 6 (six) hours as needed for wheezing or shortness of breath.   albuterol (2.5 MG/3ML) 0.083% nebulizer solution Commonly known as:  PROVENTIL USE 1 VIAL IN NEBULIZER EVERY 4 HOURS AS NEEDED FOR WHEEZING.   BREO ELLIPTA 200-25 MCG/INH Aepb Generic drug:  fluticasone furoate-vilanterol INHALE 1 PUFF DAILY.   busPIRone 10 MG tablet Commonly known as:  BUSPAR TAKE (1) TABLET TWICE DAILY.   butalbital-acetaminophen-caffeine 50-325-40 MG tablet Commonly known as:  FIORICET, ESGIC TAKE 1 TABLET TWICE DAILY AS NEEDED FOR HEADACHE.   DEXILANT 60 MG capsule Generic drug:  dexlansoprazole TAKE 1 CAPSULE BY MOUTH ONCE DAILY   diazepam 5 MG tablet Commonly known as:  VALIUM TAKE 1 TABLET EVERY 12 HOURS AS NEEDED FOR ANXIETY.   DULoxetine 60 MG capsule Commonly known as:  CYMBALTA Take 1 capsule (60 mg total) by mouth at bedtime.   escitalopram 20 MG tablet Commonly known as:  LEXAPRO TAKE (1) TABLET BY MOUTH ONCE DAILY.   estradiol 0.1 MG/GM vaginal cream Commonly known as:  ESTRACE VAGINAL Place 1 Applicatorful vaginally at bedtime.   fludrocortisone 0.1 MG tablet Commonly known as:  FLORINEF TAKE 1 TABLET ONCE DAILY.   furosemide 40 MG tablet Commonly known as:  LASIX TAKE (1) TABLET BY MOUTH ONCE DAILY.   glucose blood test strip Commonly known as:  ONETOUCH VERIO Check blood sugar daily-- dx E11.9   HYDROcodone-acetaminophen 7.5-325  MG tablet Commonly known as:  NORCO Take 1 tablet by mouth 2 (two) times daily.   ipratropium 0.02 % nebulizer solution Commonly known as:  ATROVENT USE 1 VIAL IN NEBULIZER EVERY 4 HOURSE AS NEEDED FOR WHEEZING.   lamoTRIgine 100 MG tablet Commonly known as:  LAMICTAL TAKE (1) TABLET BY MOUTH ONCE DAILY.   levETIRAcetam 500 MG tablet Commonly known as:  KEPPRA TAKE (1) TABLET TWICE DAILY.   Loratadine 10 MG Caps Take 1 capsule (10 mg total) by mouth daily.   methocarbamol 500 MG tablet Commonly known as:  ROBAXIN Take 500 mg by mouth every 8 (eight) hours as needed for muscle spasms.   metroNIDAZOLE 500 MG tablet Commonly known as:  FLAGYL Take 1 tablet (500 mg total) by mouth 2 (two) times daily.   miconazole 2 % cream Commonly known as:  MICOTIN Apply 1 application topically 2 (two) times daily.   midodrine 5 MG tablet Commonly known as:  PROAMATINE TAKE (1/2) TABLET THREE TIMES DAILY.   nitroGLYCERIN 0.4 MG SL tablet Commonly known as:  NITROSTAT Place 1 tablet (0.4 mg total) under the tongue every 5 (five) minutes as needed for chest pain.   ONETOUCH DELICA LANCETS 33G Misc 1 daily to keep diary of blood sugars-- Dx E11.9   oxybutynin 5 MG tablet Commonly known as:  DITROPAN Take 1 tablet (5 mg total) by mouth 2 (two) times  daily.   ranitidine 150 MG tablet Commonly known as:  ZANTAC Take 1 tablet (150 mg total) by mouth at bedtime.   rosuvastatin 10 MG tablet Commonly known as:  CRESTOR Take 1 tablet (10 mg total) by mouth daily.   tolterodine 2 MG tablet Commonly known as:  DETROL TAKE (1) TABLET TWICE DAILY.   traZODone 150 MG tablet Commonly known as:  DESYREL TAKE (1) TABLET DAILY AT BEDTIME.   valACYclovir 500 MG tablet Commonly known as:  VALTREX TAKE (1) TABLET TWICE DAILY.        Follow-up: Return if symptoms worsen or fail to improve.  Mechele Claude, M.D.

## 2016-06-11 ENCOUNTER — Other Ambulatory Visit: Payer: Self-pay | Admitting: Nurse Practitioner

## 2016-06-11 ENCOUNTER — Telehealth: Payer: Self-pay | Admitting: Nurse Practitioner

## 2016-06-11 DIAGNOSIS — W19XXXA Unspecified fall, initial encounter: Secondary | ICD-10-CM

## 2016-06-11 NOTE — Telephone Encounter (Signed)
Patient aware that rx was sent into pharmacy yesterday.

## 2016-06-11 NOTE — Telephone Encounter (Signed)
What is the name of the medication? Cream for yeast inf  Have you contacted your pharmacy to request a refill? yes  Which pharmacy would you like this sent to?[facility]   Patient notified that their request is being sent to the clinical staff for review and that they should receive a call once it is complete. If they do not receive a call within 24 hours they can check with their pharmacy or our office.

## 2016-06-12 ENCOUNTER — Encounter: Payer: Self-pay | Admitting: *Deleted

## 2016-06-23 ENCOUNTER — Telehealth: Payer: Self-pay | Admitting: Internal Medicine

## 2016-06-23 DIAGNOSIS — G40909 Epilepsy, unspecified, not intractable, without status epilepticus: Secondary | ICD-10-CM | POA: Diagnosis not present

## 2016-06-23 DIAGNOSIS — Z950 Presence of cardiac pacemaker: Secondary | ICD-10-CM | POA: Diagnosis not present

## 2016-06-23 DIAGNOSIS — E7800 Pure hypercholesterolemia, unspecified: Secondary | ICD-10-CM | POA: Diagnosis not present

## 2016-06-23 DIAGNOSIS — I252 Old myocardial infarction: Secondary | ICD-10-CM | POA: Diagnosis not present

## 2016-06-23 DIAGNOSIS — Z79899 Other long term (current) drug therapy: Secondary | ICD-10-CM | POA: Diagnosis not present

## 2016-06-23 DIAGNOSIS — R0789 Other chest pain: Secondary | ICD-10-CM | POA: Diagnosis not present

## 2016-06-23 DIAGNOSIS — J45909 Unspecified asthma, uncomplicated: Secondary | ICD-10-CM | POA: Diagnosis not present

## 2016-06-23 DIAGNOSIS — J181 Lobar pneumonia, unspecified organism: Secondary | ICD-10-CM | POA: Diagnosis not present

## 2016-06-23 DIAGNOSIS — K219 Gastro-esophageal reflux disease without esophagitis: Secondary | ICD-10-CM | POA: Diagnosis not present

## 2016-06-23 DIAGNOSIS — F319 Bipolar disorder, unspecified: Secondary | ICD-10-CM | POA: Diagnosis not present

## 2016-06-23 DIAGNOSIS — I10 Essential (primary) hypertension: Secondary | ICD-10-CM | POA: Diagnosis not present

## 2016-06-23 DIAGNOSIS — R079 Chest pain, unspecified: Secondary | ICD-10-CM | POA: Diagnosis not present

## 2016-06-23 NOTE — Telephone Encounter (Signed)
New Message  Pt c/o of Chest Pain: STAT if CP now or developed within 24 hours  1. Are you having CP right now? Yes  2. Are you experiencing any other symptoms (ex. SOB, nausea, vomiting, sweating)? No  3. How long have you been experiencing CP? 3-4 weeks but currently having CP right now  4. Is your CP continuous or coming and going? Continuous  5. Have you taken Nitroglycerin? Yes ?

## 2016-06-23 NOTE — Telephone Encounter (Signed)
Received a call from patient.She stated she has been having chest pain for the past 2 to 3 weeks.She is having chest pain at present rates pain # 3 to 4.Advised to go to [facility] ED.Trish called.

## 2016-06-24 ENCOUNTER — Ambulatory Visit (INDEPENDENT_AMBULATORY_CARE_PROVIDER_SITE_OTHER): Payer: Medicare Other | Admitting: Family

## 2016-06-24 ENCOUNTER — Encounter: Payer: Self-pay | Admitting: Family

## 2016-06-24 VITALS — BP 136/76 | HR 61 | Temp 99.3°F | Ht 61.0 in | Wt 152.4 lb

## 2016-06-24 DIAGNOSIS — J181 Lobar pneumonia, unspecified organism: Secondary | ICD-10-CM

## 2016-06-24 DIAGNOSIS — J189 Pneumonia, unspecified organism: Secondary | ICD-10-CM

## 2016-06-24 NOTE — Patient Instructions (Signed)
Community-Acquired Pneumonia, Adult °Pneumonia is an infection of the lungs. One type of pneumonia can happen while a person is in a hospital. A different type can happen when a person is not in a hospital (community-acquired pneumonia). It is easy for this kind to spread from person to person. It can spread to you if you breathe near an infected person who coughs or sneezes. Some symptoms include: °· A dry cough. °· A wet (productive) cough. °· Fever. °· Sweating. °· Chest pain. °Follow these instructions at home: °· Take over-the-counter and prescription medicines only as told by your doctor. °¨ Only take cough medicine if you are losing sleep. °¨ If you were prescribed an antibiotic medicine, take it as told by your doctor. Do not stop taking the antibiotic even if you start to feel better. °· Sleep with your head and neck raised (elevated). You can do this by putting a few pillows under your head, or you can sleep in a recliner. °· Do not use tobacco products. These include cigarettes, chewing tobacco, and e-cigarettes. If you need help quitting, ask your doctor. °· Drink enough water to keep your pee (urine) clear or pale yellow. °A shot (vaccine) can help prevent pneumonia. Shots are often suggested for: °· People older than 60 years of age. °· People older than 60 years of age: °¨ Who are having cancer treatment. °¨ Who have long-term (chronic) lung disease. °¨ Who have problems with their body's defense system (immune system). °You may also prevent pneumonia if you take these actions: °· Get the flu (influenza) shot every year. °· Go to the dentist as often as told. °· Wash your hands often. If soap and water are not available, use hand sanitizer. °Contact a doctor if: °· You have a fever. °· You lose sleep because your cough medicine does not help. °Get help right away if: °· You are short of breath and it gets worse. °· You have more chest pain. °· Your sickness gets worse. This is very serious if: °¨ You  are an older adult. °¨ Your body's defense system is weak. °· You cough up blood. °This information is not intended to replace advice given to you by your health care provider. Make sure you discuss any questions you have with your health care provider. °Document Released: 09/03/2007 Document Revised: 08/23/2015 Document Reviewed: 07/12/2014 °Elsevier Interactive Patient Education © 2017 [employer] ° °

## 2016-06-24 NOTE — Progress Notes (Signed)
Subjective:    Patient ID: [name], female    DOB: 05/23/1955, 61 y.o.   MRN: [id number]  Pt presents to the office today for hospital follow up for chest pain. Pt was diagnosed with left lower lobe pneumonia and given Zpack. Pt states she was told her urine tested positive for amphetamines and benzodiazepines. PT states she does not understand how this is possible. She reports taking Valium, fioricetm and Norco. She denies any ADHD meds or using meth. Cough  This is a new problem. The current episode started 1 to 4 weeks ago. The problem has been gradually worsening. The problem occurs every few minutes. The cough is non-productive. Associated symptoms include headaches, nasal congestion, shortness of breath and wheezing. Pertinent negatives include no chills, ear congestion, ear pain, fever, postnasal drip, rhinorrhea or sore throat. She has tried rest (Zpak) for the symptoms. The treatment provided mild relief. There is no history of asthma or COPD.   Hospital notes reviewed.   Review of Systems  Constitutional: Negative for chills and fever.  HENT: Negative for ear pain, postnasal drip, rhinorrhea and sore throat.   Respiratory: Positive for cough, shortness of breath and wheezing.   Neurological: Positive for headaches.  All other systems reviewed and are negative.      Objective:   Physical Exam  Constitutional: She is oriented to person, place, and time. She appears well-developed and well-nourished. No distress.  HENT:  Head: Normocephalic and atraumatic.  Right Ear: External ear normal.  Left Ear: External ear normal.  Nose: Mucosal edema and rhinorrhea present.  Mouth/Throat: Oropharynx is clear and moist.  Eyes: Pupils are equal, round, and reactive to light.  Neck: Normal range of motion. Neck supple. No thyromegaly present.  Cardiovascular: Normal rate, regular rhythm, normal heart sounds and intact distal pulses.   No murmur heard. Pulmonary/Chest: Effort  normal. No respiratory distress. She has decreased breath sounds in the left lower field. She has no wheezes.  Abdominal: Soft. Bowel sounds are normal. She exhibits no distension. There is no tenderness.  Musculoskeletal: Normal range of motion. She exhibits no edema or tenderness.  Neurological: She is alert and oriented to person, place, and time. She has normal reflexes. No cranial nerve deficit.  Skin: Skin is warm and dry.  Psychiatric: She has a normal mood and affect. Her behavior is normal. Judgment and thought content normal.  Vitals reviewed.   BP 136/76   Pulse 61   Temp 99.3 F (37.4 C) (Oral)   Ht 5\' 1"  (1.549 m)   Wt 152 lb 6.4 oz (69.1 kg)   BMI 28.80 kg/m      Assessment & Plan:  1. Community acquired pneumonia of left lower lobe of lung (HCC) Continue Zpak - Take meds as prescribed - Use a cool mist humidifier  -Use saline nose sprays frequently -Saline irrigations of the nose can be very helpful if done frequently.  * 4X daily for 1 week*  * Use of a nettie pot can be helpful with this. Follow directions with this* -Force fluids -For any cough or congestion  Use plain Mucinex- regular strength or max strength is fine   * Children- consult with Pharmacist for dosing -For fever or aces or pains- take tylenol or ibuprofen appropriate for age and weight.  * for fevers greater than 101 orally you may alternate ibuprofen and tylenol every  3 hours. -Throat lozenges if help Follow up in 4 weeks to recheck or sooner if chest  pain or SOB worsen or do not improve  *PT offered to leave another urine, but I discussed with patient to follow up with her PCP.   Jannifer Rodney, FNP

## 2016-06-30 ENCOUNTER — Telehealth: Payer: Self-pay | Admitting: Nurse Practitioner

## 2016-06-30 ENCOUNTER — Other Ambulatory Visit: Payer: Self-pay | Admitting: Nurse Practitioner

## 2016-06-30 ENCOUNTER — Other Ambulatory Visit: Payer: Self-pay | Admitting: Cardiology

## 2016-06-30 DIAGNOSIS — F329 Major depressive disorder, single episode, unspecified: Secondary | ICD-10-CM

## 2016-06-30 DIAGNOSIS — F32A Depression, unspecified: Secondary | ICD-10-CM

## 2016-06-30 NOTE — Telephone Encounter (Signed)
What symptoms do you have? Diverticulitis. Vomiting , diarrhea, abdominal pain  How long have you been sick? today  Have you been seen for this problem? In the past. Please call her.  If your provider decides to give you a prescription, which pharmacy would you like for it to be sent to? [facility].    Patient informed that this information will be sent to the clinical staff for review and that they should receive a follow up call.

## 2016-06-30 NOTE — Telephone Encounter (Signed)
Pt is having LLQ pain with vomitting and diarrhea that started yesterday Some chills Wants RX for diverticulitis

## 2016-07-01 ENCOUNTER — Other Ambulatory Visit: Payer: Self-pay | Admitting: Nurse Practitioner

## 2016-07-01 ENCOUNTER — Ambulatory Visit (INDEPENDENT_AMBULATORY_CARE_PROVIDER_SITE_OTHER): Payer: Medicare Other | Admitting: Cardiology

## 2016-07-01 ENCOUNTER — Encounter: Payer: Self-pay | Admitting: Nurse Practitioner

## 2016-07-01 ENCOUNTER — Encounter: Payer: Self-pay | Admitting: Cardiology

## 2016-07-01 ENCOUNTER — Ambulatory Visit (INDEPENDENT_AMBULATORY_CARE_PROVIDER_SITE_OTHER): Payer: Medicare Other | Admitting: Nurse Practitioner

## 2016-07-01 VITALS — BP 118/82 | HR 71 | Temp 98.3°F | Ht 62.0 in | Wt 150.0 lb

## 2016-07-01 VITALS — BP 105/75 | HR 64 | Ht 62.0 in | Wt 150.0 lb

## 2016-07-01 DIAGNOSIS — R0789 Other chest pain: Secondary | ICD-10-CM

## 2016-07-01 DIAGNOSIS — R55 Syncope and collapse: Secondary | ICD-10-CM

## 2016-07-01 DIAGNOSIS — R1032 Left lower quadrant pain: Secondary | ICD-10-CM | POA: Diagnosis not present

## 2016-07-01 DIAGNOSIS — K5732 Diverticulitis of large intestine without perforation or abscess without bleeding: Secondary | ICD-10-CM | POA: Diagnosis not present

## 2016-07-01 LAB — URINALYSIS, COMPLETE
Bilirubin, UA: NEGATIVE
Glucose, UA: NEGATIVE
LEUKOCYTES UA: NEGATIVE
Nitrite, UA: NEGATIVE
PH UA: 6 (ref 5.0–7.5)
RBC, UA: NEGATIVE
Specific Gravity, UA: >1.03 — ABNORMAL HIGH (ref 1.005–1.030)
Urobilinogen, Ur: 0.2 mg/dL (ref 0.2–1.0)

## 2016-07-01 LAB — MICROSCOPIC EXAMINATION: Renal Epithel, UA: NONE SEEN /hpf

## 2016-07-01 MED ORDER — FLUDROCORTISONE ACETATE 0.1 MG PO TABS: 0.2000 mg | ORAL_TABLET | Freq: Every day | ORAL | 0 refills | Status: DC

## 2016-07-01 MED ORDER — CIPROFLOXACIN HCL 500 MG PO TABS: 500.0000 mg | ORAL_TABLET | Freq: Two times a day (BID) | ORAL | 0 refills | Status: DC

## 2016-07-01 MED ORDER — FLUCONAZOLE 150 MG PO TABS: ORAL_TABLET | ORAL | 0 refills | Status: DC

## 2016-07-01 MED ORDER — METRONIDAZOLE 500 MG PO TABS: 500.0000 mg | ORAL_TABLET | Freq: Two times a day (BID) | ORAL | 0 refills | Status: DC

## 2016-07-01 NOTE — Patient Instructions (Signed)
Your physician wants you to follow-up in: 4 MONTHS WITH DR [facility]  You will receive a reminder letter in the mail two months in advance. If you don't receive a letter, please call our office to schedule the follow-up appointment.  Your physician has recommended you make the following change in your medication:   INCREASE FLORINEF 0.2 MG DAILY  Thank you for choosing [facility]!!

## 2016-07-01 NOTE — Addendum Note (Signed)
Addended by: Bennie Pierini on: 07/01/2016 04:53 PM   Modules accepted: Orders

## 2016-07-01 NOTE — Progress Notes (Signed)
   Subjective:    Patient ID: [name], female    DOB: 07/27/1955, 61 y.o.   MRN: [id number]  HPI  Patient comes in today c/o left lower quadrant pain that started over the weekend. Painis a cramping pain very similar to the pain she has when she has a diverticulitis flare up. SHe denies dysuria or frequency. No constipation or diarrhea..   Review of Systems  Constitutional: Negative.   HENT: Negative.   Respiratory: Negative.   Cardiovascular: Negative.   Gastrointestinal: Positive for abdominal pain (mainly in left lower quadrant). Negative for constipation, diarrhea, nausea and vomiting.  Genitourinary: Positive for dysuria, flank pain, frequency and urgency.  Neurological: Negative.   Psychiatric/Behavioral: Negative.   All other systems reviewed and are negative.      Objective:   Physical Exam  Constitutional: She appears well-developed and well-nourished. No distress.  Cardiovascular: Normal rate.   Pulmonary/Chest: Effort normal.  Abdominal: Soft. Bowel sounds are normal. There is tenderness (left lower quadrant on light palpation).  Skin: Skin is warm.  Psychiatric: She has a normal mood and affect. Her behavior is normal. Judgment and thought content normal.   BP 118/82   Pulse 71   Temp 98.3 F (36.8 C) (Oral)   Ht 5\' 2"  (1.575 m)   Wt 150 lb (68 kg)   BMI 27.44 kg/m   urine clear      Assessment & Plan:   1. Left lower quadrant pain   2. Diverticulitis of large intestine without perforation or abscess without bleeding    Meds ordered this encounter  Medications  . ciprofloxacin (CIPRO) 500 MG tablet    Sig: Take 1 tablet (500 mg total) by mouth 2 (two) times daily.    Dispense:  20 tablet    Refill:  0    Order Specific Question:   Supervising Provider    Answer:   VINCENT, CAROL L [[id number]]  . metroNIDAZOLE (FLAGYL) 500 MG tablet    Sig: Take 1 tablet (500 mg total) by mouth 2 (two) times daily.    Dispense:  20 tablet    Refill:  0   Order Specific Question:   Supervising Provider    Answer:   [[id number]]   Watch diet- avoid all foods with seeds or undigestable skin RTO prn  [name] Johna Sheriff, FNP

## 2016-07-01 NOTE — Patient Instructions (Signed)

## 2016-07-01 NOTE — Progress Notes (Signed)
Clinical Summary [name] is a 61 y.o.female seen today for follow up of the following medical problems.    1. Chest pain - several year history of chest pain - normal cath in 2013, normal stress test 2013 - admit to [facility] 07/2014 with chest pain. Negative workup for ACS. She was discharged with an out patient Lexiscan which showed no evidence of ischemia.  -  echo 11/2014 LVEF 55-60%, no WMAs.    - seen at [facility] 05/2016 with chest pain. Diagnosed with pneumonia at the time.  - chest pain started 2 weeks prior to ER visit. Nonspecific pain, 9-10/10 in severity left chest under left breast. Into neck and left arm. No other associated symptoms. Worst with deep breaths. Constant pain with variability over the last several weeks - not related to food. Some better with NG - similar to previous pain, but less severe.    2. 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  - occur about once every 4-6 months. Tend to occur while walking.     Past Medical History:  Diagnosis Date  . Anginal pain (HCC)    last time   . Anxiety   . Arthritis    RHEUMATOID  . Asthma   . Bipolar 1 disorder (HCC)   . COPD (chronic obstructive pulmonary disease) (HCC)   . Coronary artery disease    reported hx of "MI";  Echo 2009 with normal LVF;  Myoview 05/2011: no ischemia  . Depression   . Dyslipidemia   . Esophageal stricture   . Fibromyalgia   . GERD (gastroesophageal reflux disease)   . H/O hiatal hernia   . Head injury, unspecified   . Herpes simplex infection   . History of kidney stones   . Hyperlipidemia   . Hypertension   . Insomnia   . Myocardial infarction   . Osteoporosis   . Pneumonia    hx  .  Seizures (HCC)    last 3 weeks ago- prescribed keppra -never took didnt like side effects read about online  . Shortness of breath   . Sleep apnea    ? neg  . Spinal stenosis of lumbar region   . Spondylolisthesis   . Status post placement of implantable loop recorder   . Supraventricular tachycardia (HCC)   . Syncope and collapse    s/p ILR; no arhythmogenic cause identified  . UTI (lower urinary tract infection)      Allergies  Allergen Reactions  . Codeine Other (See Comments)    "I will have a heart attack."  . Morphine And Related Other (See Comments)    "It will cause me to have a heart attack."  . Ambien [Zolpidem Tartrate] Nausea And Vomiting  . Lyrica [Pregabalin] Swelling and Other (See Comments)    Weight gain  . Neurontin [Gabapentin] Swelling     Current Outpatient Prescriptions  Medication Sig Dispense Refill  . acyclovir ointment (ZOVIRAX) 5 % APPLY TO AFFECTED AREA EVERY 3 HOURS 30 g 5  . albuterol (PROVENTIL HFA;VENTOLIN HFA) 108 (90 Base) MCG/ACT inhaler Inhale 2 puffs into the lungs every 6 (six) hours as needed for wheezing or shortness of breath.    [name] albuterol (PROVENTIL) (2.5 MG/3ML) 0.083%  nebulizer solution USE 1 VIAL IN NEBULIZER EVERY 4 HOURS AS NEEDED FOR WHEEZING. 180 mL 3  . azithromycin (ZITHROMAX) 500 MG tablet Take by mouth daily.    [name] BREO ELLIPTA 200-25 MCG/INH AEPB INHALE 1 PUFF DAILY. 60 each 5  . busPIRone (BUSPAR) 10 MG tablet TAKE (1) TABLET TWICE DAILY. 60 tablet 2  . butalbital-acetaminophen-caffeine (FIORICET, ESGIC) 50-325-40 MG tablet TAKE 1 TABLET TWICE DAILY AS NEEDED FOR HEADACHE. 15 tablet 2  . DEXILANT 60 MG capsule TAKE 1 CAPSULE BY MOUTH ONCE DAILY 30 capsule 5  . diazepam (VALIUM) 5 MG tablet TAKE 1 TABLET EVERY 12 HOURS AS NEEDED FOR ANXIETY. 60 tablet 1  . DULoxetine (CYMBALTA) 60 MG capsule Take 1 capsule (60 mg total) by mouth at bedtime. 90 capsule 1  . escitalopram (LEXAPRO) 20 MG tablet TAKE (1) TABLET BY MOUTH ONCE  DAILY. 30 tablet 2  . estradiol (ESTRACE VAGINAL) 0.1 MG/GM vaginal cream Place 1 Applicatorful vaginally at bedtime. 42.5 g 12  . fludrocortisone (FLORINEF) 0.1 MG tablet TAKE 1 TABLET ONCE DAILY. 30 tablet 0  . furosemide (LASIX) 40 MG tablet TAKE (1) TABLET BY MOUTH ONCE DAILY. 30 tablet 5  . glucose blood (ONETOUCH VERIO) test strip Check blood sugar daily-- dx E11.9 100 each 12  . HYDROcodone-acetaminophen (NORCO) 7.5-325 MG tablet Take 1 tablet by mouth 2 (two) times daily. 60 tablet 0  . ipratropium (ATROVENT) 0.02 % nebulizer solution USE 1 VIAL IN NEBULIZER EVERY 4 HOURSE AS NEEDED FOR WHEEZING. 150 mL 3  . lamoTRIgine (LAMICTAL) 100 MG tablet TAKE (1) TABLET BY MOUTH ONCE DAILY. 30 tablet 5  . levETIRAcetam (KEPPRA) 500 MG tablet TAKE (1) TABLET TWICE DAILY. 60 tablet 2  . Loratadine 10 MG CAPS Take 1 capsule (10 mg total) by mouth daily. 90 each 3  . methocarbamol (ROBAXIN) 500 MG tablet Take 500 mg by mouth every 8 (eight) hours as needed for muscle spasms.    . metroNIDAZOLE (FLAGYL) 500 MG tablet Take 1 tablet (500 mg total) by mouth 2 (two) times daily. 14 tablet 0  . miconazole (MICOTIN) 2 % cream Apply 1 application topically 2 (two) times daily. 28.35 g 0  . midodrine (PROAMATINE) 5 MG tablet TAKE (1/2) TABLET THREE TIMES DAILY. 45 tablet 1  . nitroGLYCERIN (NITROSTAT) 0.4 MG SL tablet Place 1 tablet (0.4 mg total) under the tongue every 5 (five) minutes as needed for chest pain. 25 tablet 0  . ONETOUCH DELICA LANCETS 33G MISC 1 daily to keep diary of blood sugars-- Dx E11.9 100 each 1  . oxybutynin (DITROPAN) 5 MG tablet Take 1 tablet (5 mg total) by mouth 2 (two) times daily. 60 tablet 5  . ranitidine (ZANTAC) 150 MG tablet Take 1 tablet (150 mg total) by mouth at bedtime. 90 tablet 1  . rosuvastatin (CRESTOR) 10 MG tablet Take 1 tablet (10 mg total) by mouth daily. 90 tablet 1  . tolterodine (DETROL) 2 MG tablet TAKE (1) TABLET TWICE DAILY. 60 tablet 5  . traZODone (DESYREL)  150 MG tablet TAKE (1) TABLET DAILY AT BEDTIME. 30 tablet 2  . valACYclovir (VALTREX) 500 MG tablet TAKE (1) TABLET TWICE DAILY. 60 tablet 5   No current facility-administered medications for this visit.      Past Surgical History:  Procedure Laterality Date  . BACK SURGERY    . BREAST SURGERY     lumpectomy  . CHOLECYSTECTOMY    . CYSTOSCOPY     stone  . DOPPLER  ECHOCARDIOGRAPHY  2009  . head up tilt table testing  06/15/2007   [name]  . HEMORRHOID SURGERY    . insertion of implatable loop recorder  08/11/2007   [name]  . POSTERIOR CERVICAL FUSION/FORAMINOTOMY N/A 12/19/2013   Procedure: RIGHT C3-4.C4-5 AND C5-6 FORAMINOTOMIES;  Surgeon: Kerrin Champagne, MD;  Location[facility] OR;  Service: Orthopedics;  Laterality: N/A;  . TUBAL LIGATION       Allergies  Allergen Reactions  . Codeine Other (See Comments)    "I will have a heart attack."  . Morphine And Related Other (See Comments)    "It will cause me to have a heart attack."  . Ambien [Zolpidem Tartrate] Nausea And Vomiting  . Lyrica [Pregabalin] Swelling and Other (See Comments)    Weight gain  . Neurontin [Gabapentin] Swelling      Family History  Problem Relation Age of Onset  . Heart attack Father   . Cancer Father   . Mental illness Father   . Cancer Mother   . Mental illness Mother   . Heart attack Brother     stents  . Colon cancer Maternal Aunt   . Stomach cancer Neg Hx      Social History Ms. Merriweather reports that she has never smoked. She has never used smokeless tobacco. Ms. Villanova reports that she does not drink alcohol.   Review of Systems CONSTITUTIONAL: No weight loss, fever, chills, weakness or fatigue.  HEENT: Eyes: No visual loss, blurred vision, double vision or yellow sclerae.No hearing loss, sneezing, congestion, runny nose or sore throat.  SKIN: No rash or itching.  CARDIOVASCULAR: per hpi RESPIRATORY: No shortness of breath, cough or sputum.  GASTROINTESTINAL: No anorexia,  nausea, vomiting or diarrhea. No abdominal pain or blood.  GENITOURINARY: No burning on urination, no polyuria NEUROLOGICAL: No headache, dizziness, syncope, paralysis, ataxia, numbness or tingling in the extremities. No change in bowel or bladder control.  MUSCULOSKELETAL: No muscle, back pain, joint pain or stiffness.  LYMPHATICS: No enlarged nodes. No history of splenectomy.  PSYCHIATRIC: No history of depression or anxiety.  ENDOCRINOLOGIC: No reports of sweating, cold or heat intolerance. No polyuria or polydipsia.  [name]   Physical Examination Vitals:   07/01/16 1059 07/01/16 1104  BP: 111/75 105/75  Pulse: 73 64   Vitals:   07/01/16 1059  Weight: 150 lb (68 kg)  Height: 5\' 2"  (1.575 m)    Gen: resting comfortably, no acute distress HEENT: no scleral icterus, pupils equal round and reactive, no palptable cervical adenopathy,  CV: RRR, no m/r/g, no jvd Resp: Clear to auscultation bilaterally GI: abdomen is soft, non-tender, non-distended, normal bowel sounds, no hepatosplenomegaly MSK: extremities are warm, no edema.  Skin: warm, no rash Neuro:  no focal deficits Psych: appropriate affect   Diagnostic Studies 05/2011 Lexiscan MPI No ischemia   Jan 2009 Echo LEFT VENTRICLE: - Left ventricular size was normal. - Overall left ventricular systolic function was normal. - There were no left ventricular regional wall motion abnormalities. - Left ventricular wall thickness was normal.  AORTIC VALVE: - The aortic valve was trileaflet. - Aortic valve thickness was normal.  AORTA: - The aortic root was normal in size. - The aortic arch was normal.  MITRAL VALVE: - Mitral valve structure was normal.  Doppler interpretation(s): - There was trivial mitral valvular regurgitation.  LEFT ATRIUM: - Left atrial size was normal.  PULMONARY VEINS: - The pulmonary veins were grossly normal.  RIGHT VENTRICLE: - Right ventricular size  was normal. - Right ventricular  systolic function was normal. - Right ventricular wall thickness was normal.  PULMONIC VALVE: - The structure of the pulmonic valve appeared to be normal.  TRICUSPID VALVE: - The tricuspid valve structure was normal.  Doppler interpretation(s): - There was no significant tricuspid valvular regurgitation.  PULMONARY ARTERY: - The pulmonary artery was normal size.  RIGHT ATRIUM: - Right atrial size was normal.  SYSTEMIC VEINS: - The inferior vena cava was normal.  PERICARDIUM: - There was no pericardial effusion.  ---------------------------------------------------------------  SUMMARY - Overall left ventricular systolic function was normal. There were no left ventricular regional wall motion abnormalities. - The pulmonary veins were grossly normal.   09/2011 Cath Hemodynamic Findings:  Central aortic pressure: 108/58  Left ventricular pressure: 107/10/14  Angiographic Findings:  Left main: No obstructive disease noted.  Left Anterior Descending Artery: Moderate to large sized vessel that courses to the apex. There is a moderate sized diagonal Deserie Dirks. No obstructive disease noted.  Circumflex Artery: Large, dominant artery with moderate sized first obtuse marginal Dalaina Tates and left sided posterolateral Allien Melberg with no disease noted.  Right Coronary Artery: Small, non-dominant vessel with no disease noted.  Left Ventricular Angiogram:LVEF=65%.  Impression:  1. No angiographic evidence of CAD  2. Normal LV systolic function  3. Non-cardiac chest pain  Recommendations: No further ischemic workup.  Complications: None. The patient tolerated the procedure well.   11/2014 echo Study Conclusions  - Left ventricle: The cavity size was normal. Systolic function was normal. The estimated ejection fraction was in the range of 55% to 60%. Wall motion was normal; there were no regional wall motion abnormalities. There was an increased  relative contribution of atrial contraction to ventricular filling. Doppler parameters are consistent with abnormal left ventricular relaxation (grade 1 diastolic dysfunction). - Mitral valve: There was trivial regurgitation. - Tricuspid valve: There was trivial regurgitation. - Pulmonic valve: There was trivial regurgitation.    Assessment and Plan  1. Chest pain - long history of symptoms with multiple negative stress tests, most recently 07/2014 Lexiscan was negative at [facility]. Cath 2013 with patent vessels - recent chest pain in setting of pneumonia, atypical - no further cardiac workup at this time, continue to monitor.    2. Syncope - recurrent episodes. We will increase florinef to 0.2mg  daily.       F/u 4 months      Antoine Poche, M.D.

## 2016-07-01 NOTE — Telephone Encounter (Signed)
NTBS.

## 2016-07-02 ENCOUNTER — Other Ambulatory Visit: Payer: Self-pay

## 2016-07-02 MED ORDER — NITROGLYCERIN 0.4 MG SL SUBL: 0.4000 mg | SUBLINGUAL_TABLET | SUBLINGUAL | 0 refills | Status: DC | PRN

## 2016-07-08 ENCOUNTER — Telehealth: Payer: Self-pay | Admitting: Nurse Practitioner

## 2016-07-08 MED ORDER — FLUCONAZOLE 150 MG PO TABS: 150.0000 mg | ORAL_TABLET | Freq: Once | ORAL | 0 refills | Status: AC

## 2016-07-08 NOTE — Telephone Encounter (Signed)
diflucan rx sent to pharmacy 

## 2016-07-14 ENCOUNTER — Encounter: Payer: Self-pay | Admitting: Nurse Practitioner

## 2016-07-14 ENCOUNTER — Ambulatory Visit (INDEPENDENT_AMBULATORY_CARE_PROVIDER_SITE_OTHER): Payer: Medicare Other | Admitting: Nurse Practitioner

## 2016-07-14 VITALS — BP 111/74 | HR 65 | Temp 97.1°F | Ht 62.0 in | Wt 147.0 lb

## 2016-07-14 DIAGNOSIS — F3341 Major depressive disorder, recurrent, in partial remission: Secondary | ICD-10-CM

## 2016-07-14 DIAGNOSIS — E785 Hyperlipidemia, unspecified: Secondary | ICD-10-CM

## 2016-07-14 DIAGNOSIS — F5101 Primary insomnia: Secondary | ICD-10-CM | POA: Diagnosis not present

## 2016-07-14 DIAGNOSIS — W19XXXA Unspecified fall, initial encounter: Secondary | ICD-10-CM

## 2016-07-14 DIAGNOSIS — J4520 Mild intermittent asthma, uncomplicated: Secondary | ICD-10-CM | POA: Diagnosis not present

## 2016-07-14 DIAGNOSIS — K2100 Gastro-esophageal reflux disease with esophagitis, without bleeding: Secondary | ICD-10-CM

## 2016-07-14 DIAGNOSIS — R3915 Urgency of urination: Secondary | ICD-10-CM | POA: Diagnosis not present

## 2016-07-14 DIAGNOSIS — R569 Unspecified convulsions: Secondary | ICD-10-CM | POA: Diagnosis not present

## 2016-07-14 DIAGNOSIS — I10 Essential (primary) hypertension: Secondary | ICD-10-CM

## 2016-07-14 DIAGNOSIS — B009 Herpesviral infection, unspecified: Secondary | ICD-10-CM

## 2016-07-14 DIAGNOSIS — F411 Generalized anxiety disorder: Secondary | ICD-10-CM | POA: Insufficient documentation

## 2016-07-14 DIAGNOSIS — F331 Major depressive disorder, recurrent, moderate: Secondary | ICD-10-CM | POA: Diagnosis not present

## 2016-07-14 MED ORDER — HYDROCODONE-ACETAMINOPHEN 7.5-325 MG PO TABS: 1.0000 | ORAL_TABLET | Freq: Two times a day (BID) | ORAL | 0 refills | Status: DC

## 2016-07-14 MED ORDER — RANITIDINE HCL 150 MG PO TABS
150.0000 mg | ORAL_TABLET | Freq: Every day | ORAL | 1 refills | Status: DC
Start: 2016-07-14 — End: 2016-12-16

## 2016-07-14 MED ORDER — TRAZODONE HCL 150 MG PO TABS: ORAL_TABLET | ORAL | 5 refills | Status: DC

## 2016-07-14 MED ORDER — DIAZEPAM 5 MG PO TABS: ORAL_TABLET | ORAL | 1 refills | Status: DC

## 2016-07-14 MED ORDER — LEVETIRACETAM 500 MG PO TABS: ORAL_TABLET | ORAL | 5 refills | Status: DC

## 2016-07-14 MED ORDER — TOLTERODINE TARTRATE 2 MG PO TABS: ORAL_TABLET | ORAL | 5 refills | Status: DC

## 2016-07-14 MED ORDER — LAMOTRIGINE 100 MG PO TABS: ORAL_TABLET | ORAL | 5 refills | Status: DC

## 2016-07-14 MED ORDER — DEXLANSOPRAZOLE 60 MG PO CPDR: 1.0000 | DELAYED_RELEASE_CAPSULE | Freq: Every day | ORAL | 5 refills | Status: DC

## 2016-07-14 MED ORDER — ESCITALOPRAM OXALATE 20 MG PO TABS: ORAL_TABLET | ORAL | 5 refills | Status: DC

## 2016-07-14 MED ORDER — ROSUVASTATIN CALCIUM 10 MG PO TABS: 10.0000 mg | ORAL_TABLET | Freq: Every day | ORAL | 1 refills | Status: DC

## 2016-07-14 MED ORDER — DULOXETINE HCL 60 MG PO CPEP: 60.0000 mg | ORAL_CAPSULE | Freq: Every day | ORAL | 1 refills | Status: DC

## 2016-07-14 MED ORDER — VALACYCLOVIR HCL 500 MG PO TABS: ORAL_TABLET | ORAL | 5 refills | Status: DC

## 2016-07-14 NOTE — Progress Notes (Signed)
Subjective:    Patient ID: [name], female    DOB: 01-26-1956, 61 y.o.   MRN: [id number]  HPI  [name] is here today for follow up of chronic medical problem.  Outpatient Encounter Prescriptions as of 07/14/2016  Medication Sig  . acyclovir ointment (ZOVIRAX) 5 % APPLY TO AFFECTED AREA EVERY 3 HOURS  . albuterol (PROVENTIL HFA;VENTOLIN HFA) 108 (90 Base) MCG/ACT inhaler Inhale 2 puffs into the lungs every 6 (six) hours as needed for wheezing or shortness of breath.  [name] albuterol (PROVENTIL) (2.5 MG/3ML) 0.083% nebulizer solution USE 1 VIAL IN NEBULIZER EVERY 4 HOURS AS NEEDED FOR WHEEZING.  Virgel Bouquet ELLIPTA 200-25 MCG/INH AEPB INHALE 1 PUFF DAILY.  . busPIRone (BUSPAR) 10 MG tablet TAKE (1) TABLET TWICE DAILY.  . butalbital-acetaminophen-caffeine (FIORICET, ESGIC) 50-325-40 MG tablet TAKE 1 TABLET TWICE DAILY AS NEEDED FOR HEADACHE.  . ciprofloxacin (CIPRO) 500 MG tablet Take 1 tablet (500 mg total) by mouth 2 (two) times daily.  [name] DEXILANT 60 MG capsule TAKE 1 CAPSULE BY MOUTH ONCE DAILY  . diazepam (VALIUM) 5 MG tablet TAKE 1 TABLET EVERY 12 HOURS AS NEEDED FOR ANXIETY.  . DULoxetine (CYMBALTA) 60 MG capsule Take 1 capsule (60 mg total) by mouth at bedtime.  [name] escitalopram (LEXAPRO) 20 MG tablet TAKE (1) TABLET BY MOUTH ONCE DAILY.  [name] estradiol (ESTRACE VAGINAL) 0.1 MG/GM vaginal cream Place 1 Applicatorful vaginally at bedtime.  . fluconazole (DIFLUCAN) 150 MG tablet 1 po q week x 2 weeks  . fludrocortisone (FLORINEF) 0.1 MG tablet Take 2 tablets (0.2 mg total) by mouth daily.  . furosemide (LASIX) 40 MG tablet TAKE (1) TABLET BY MOUTH ONCE DAILY.  [name] glucose blood (ONETOUCH VERIO) test strip Check blood sugar daily-- dx E11.9  . HYDROcodone-acetaminophen (NORCO) 7.5-325 MG tablet Take 1 tablet by mouth 2 (two) times daily.  [name] ipratropium (ATROVENT) 0.02 % nebulizer solution USE 1 VIAL IN NEBULIZER EVERY 4 HOURS AS NEEDED FOR WHEEZING.  [name] lamoTRIgine (LAMICTAL) 100 MG  tablet TAKE (1) TABLET BY MOUTH ONCE DAILY.  [name] levETIRAcetam (KEPPRA) 500 MG tablet TAKE (1) TABLET TWICE DAILY.  [name] Loratadine 10 MG CAPS Take 1 capsule (10 mg total) by mouth daily.  . methocarbamol (ROBAXIN) 500 MG tablet Take 500 mg by mouth every 8 (eight) hours as needed for muscle spasms.  . metroNIDAZOLE (FLAGYL) 500 MG tablet Take 1 tablet (500 mg total) by mouth 2 (two) times daily.  . miconazole (MICOTIN) 2 % cream Apply 1 application topically 2 (two) times daily.  . midodrine (PROAMATINE) 5 MG tablet TAKE (1/2) TABLET THREE TIMES DAILY.  . nitroGLYCERIN (NITROSTAT) 0.4 MG SL tablet Place 1 tablet (0.4 mg total) under the tongue every 5 (five) minutes as needed for chest pain.  [name] DELICA LANCETS 33G MISC 1 daily to keep diary of blood sugars-- Dx E11.9  . oxybutynin (DITROPAN) 5 MG tablet Take 1 tablet (5 mg total) by mouth 2 (two) times daily.  . ranitidine (ZANTAC) 150 MG tablet Take 1 tablet (150 mg total) by mouth at bedtime.  . rosuvastatin (CRESTOR) 10 MG tablet Take 1 tablet (10 mg total) by mouth daily.  [name] tolterodine (DETROL) 2 MG tablet TAKE (1) TABLET TWICE DAILY.  . traZODone (DESYREL) 150 MG tablet TAKE (1) TABLET DAILY AT BEDTIME.  . valACYclovir (VALTREX) 500 MG tablet TAKE (1) TABLET TWICE DAILY.   No facility-administered encounter medications on file as of 07/14/2016.    1. Essential hypertension  Saw crdiology 07/01/16- heart function normal- no SOB or headache  2. Gastroesophageal reflux disease with esophagitis  On dexilant daily- helps most days with symptoms- really affected by what he eats.  3. Anxiety state  Stays anxious- taks valium that helps- has a lot going on with family that keeps her upset all the time.  4. Primary insomnia  trazadone helps her sleep most of the time  5. Mild intermittent asthma without complication  No recent flare ups- BREO really helps.  6. Hyperlipidemia with target LDL less than 100  Does not watch diet  7. GAD  (generalized anxiety disorder)  Addressed earlier  8. Herpes simplex virus (HSV) infection  Had outbreak a couple of months ago- takes valtrex BID     9. Recurrent major depressive disorder, in partial remission (HCC)  On lexapro and cymbalta- feels ok today  10. Seizures (HCC)  Has not had seizure recently- is still taking her keppra- sees neurologist every 6 months  11. Urinary urgency  On detrol daily- helps most of the time. Has occaional day swhere she is going to the bathroom frequently.      New complaints: No new complaints     Review of Systems  Constitutional: Negative.   HENT: Negative.   Respiratory: Negative.   Cardiovascular: Negative.   Gastrointestinal: Negative.   Genitourinary: Negative.   Musculoskeletal: Positive for arthralgias.  Neurological: Negative.   Psychiatric/Behavioral: Negative.   All other systems reviewed and are negative.      Objective:   Physical Exam  Constitutional: She is oriented to person, place, and time. She appears well-developed and well-nourished.  HENT:  Nose: Nose normal.  Mouth/Throat: Oropharynx is clear and moist.  Eyes: EOM are normal.  Neck: Trachea normal, normal range of motion and full passive range of motion without pain. Neck supple. No JVD present. Carotid bruit is not present. No thyromegaly present.  Cardiovascular: Normal rate, regular rhythm, normal heart sounds and intact distal pulses.  Exam reveals no gallop and no friction rub.   No murmur heard. Pulmonary/Chest: Effort normal and breath sounds normal.  Abdominal: Soft. Bowel sounds are normal. She exhibits distension (mld from diverticulitis). She exhibits no mass. There is no tenderness.  Musculoskeletal: Normal range of motion.  Lymphadenopathy:    She has no cervical adenopathy.  Neurological: She is alert and oriented to person, place, and time. She has normal reflexes.  Skin: Skin is warm and dry.  Psychiatric: She has a normal mood and  affect. Her behavior is normal. Judgment and thought content normal.   BP 111/74   Pulse 65   Temp 97.1 F (36.2 C) (Oral)   Ht 5\' 2"  (1.575 m)   Wt 147 lb (66.7 kg)   BMI 26.89 kg/m       Assessment & Plan:   1. Essential hypertension   2. Gastroesophageal reflux disease with esophagitis   3. Anxiety state   4. Primary insomnia   5. Mild intermittent asthma without complication   6. Hyperlipidemia with target LDL less than 100   7. GAD (generalized anxiety disorder)   8. Herpes simplex virus (HSV) infection   9. Moderate episode of recurrent major depressive disorder (HCC)   10. Recurrent major depressive disorder, in partial remission (HCC)   11. Seizures (HCC)   12. Urinary urgency   13. Fall, initial encounter    Continue all meds 'labs pending Watch diet to prevent flare up of diverticulitis Follow up in 3 months  Meds  ordered this encounter  Medications  . diazepam (VALIUM) 5 MG tablet    Sig: TAKE 1 TABLET EVERY 12 HOURS AS NEEDED FOR ANXIETY.    Dispense:  60 tablet    Refill:  1    Order Specific Question:   Supervising Provider    Answer:   VINCENT, CAROL L [[id number]]  . ranitidine (ZANTAC) 150 MG tablet    Sig: Take 1 tablet (150 mg total) by mouth at bedtime.    Dispense:  90 tablet    Refill:  1    Order Specific Question:   Supervising Provider    Answer:   VINCENT, CAROL L [[id number]]  . valACYclovir (VALTREX) 500 MG tablet    Sig: TAKE (1) TABLET TWICE DAILY.    Dispense:  60 tablet    Refill:  5    Order Specific Question:   Supervising Provider    Answer:   VINCENT, CAROL L [[id number]]  . rosuvastatin (CRESTOR) 10 MG tablet    Sig: Take 1 tablet (10 mg total) by mouth daily.    Dispense:  90 tablet    Refill:  1    Order Specific Question:   Supervising Provider    Answer:   VINCENT, CAROL L [[id number]]  . traZODone (DESYREL) 150 MG tablet    Sig: TAKE (1) TABLET DAILY AT BEDTIME.    Dispense:  30 tablet    Refill:  5    Order Specific Question:    Supervising Provider    Answer:   VINCENT, CAROL L [[id number]]  . DULoxetine (CYMBALTA) 60 MG capsule    Sig: Take 1 capsule (60 mg total) by mouth at bedtime.    Dispense:  90 capsule    Refill:  1    Order Specific Question:   Supervising Provider    Answer:   VINCENT, CAROL L [[id number]]  . lamoTRIgine (LAMICTAL) 100 MG tablet    Sig: TAKE (1) TABLET BY MOUTH ONCE DAILY.    Dispense:  30 tablet    Refill:  5    Order Specific Question:   Supervising Provider    Answer:   VINCENT, CAROL L [[id number]]  . levETIRAcetam (KEPPRA) 500 MG tablet    Sig: TAKE (1) TABLET TWICE DAILY.    Dispense:  60 tablet    Refill:  5    Order Specific Question:   Supervising Provider    Answer:   VINCENT, CAROL L [[id number]]  . dexlansoprazole (DEXILANT) 60 MG capsule    Sig: Take 1 capsule (60 mg total) by mouth daily.    Dispense:  30 capsule    Refill:  5    Order Specific Question:   Supervising Provider    Answer:   VINCENT, CAROL L [[id number]]  . tolterodine (DETROL) 2 MG tablet    Sig: TAKE (1) TABLET TWICE DAILY.    Dispense:  60 tablet    Refill:  5    Order Specific Question:   Supervising Provider    Answer:   VINCENT, CAROL L [[id number]]  . escitalopram (LEXAPRO) 20 MG tablet    Sig: TAKE (1) TABLET BY MOUTH ONCE DAILY.    Dispense:  30 tablet    Refill:  5    Order Specific Question:   Supervising Provider    Answer:   VINCENT, CAROL L [[id number]]  . HYDROcodone-acetaminophen (NORCO) 7.5-325 MG tablet    Sig: Take 1 tablet by mouth 2 (two) times daily.    Dispense:  60 tablet  Refill:  0    Order Specific Question:   Supervising Provider    Answer:   Johna Sheriff [[id number]]    [name] Daphine Deutscher, FNP

## 2016-07-14 NOTE — Patient Instructions (Signed)
Fall Prevention in the Home Falls can cause injuries. They can happen to people of all ages. There are many things you can do to make your home safe and to help prevent falls. What can I do on the outside of my home?  Regularly fix the edges of walkways and driveways and fix any cracks.  Remove anything that might make you trip as you walk through a door, such as a raised step or threshold.  Trim any bushes or trees on the path to your home.  Use bright outdoor lighting.  Clear any walking paths of anything that might make someone trip, such as rocks or tools.  Regularly check to see if handrails are loose or broken. Make sure that both sides of any steps have handrails.  Any raised decks and porches should have guardrails on the edges.  Have any leaves, snow, or ice cleared regularly.  Use sand or salt on walking paths during winter.  Clean up any spills in your garage right away. This includes oil or grease spills. What can I do in the bathroom?  Use night lights.  Install grab bars by the toilet and in the tub and shower. Do not use towel bars as grab bars.  Use non-skid mats or decals in the tub or shower.  If you need to sit down in the shower, use a plastic, non-slip stool.  Keep the floor dry. Clean up any water that spills on the floor as soon as it happens.  Remove soap buildup in the tub or shower regularly.  Attach bath mats securely with double-sided non-slip rug tape.  Do not have throw rugs and other things on the floor that can make you trip. What can I do in the bedroom?  Use night lights.  Make sure that you have a light by your bed that is easy to reach.  Do not use any sheets or blankets that are too big for your bed. They should not hang down onto the floor.  Have a firm chair that has side arms. You can use this for support while you get dressed.  Do not have throw rugs and other things on the floor that can make you trip. What can I do in the  kitchen?  Clean up any spills right away.  Avoid walking on wet floors.  Keep items that you use a lot in easy-to-reach places.  If you need to reach something above you, use a strong step stool that has a grab bar.  Keep electrical cords out of the way.  Do not use floor polish or wax that makes floors slippery. If you must use wax, use non-skid floor wax.  Do not have throw rugs and other things on the floor that can make you trip. What can I do with my stairs?  Do not leave any items on the stairs.  Make sure that there are handrails on both sides of the stairs and use them. Fix handrails that are broken or loose. Make sure that handrails are as long as the stairways.  Check any carpeting to make sure that it is firmly attached to the stairs. Fix any carpet that is loose or worn.  Avoid having throw rugs at the top or bottom of the stairs. If you do have throw rugs, attach them to the floor with carpet tape.  Make sure that you have a light switch at the top of the stairs and the bottom of the stairs. If you do   not have them, ask someone to add them for you. What else can I do to help prevent falls?  Wear shoes that:  Do not have high heels.  Have rubber bottoms.  Are comfortable and fit you well.  Are closed at the toe. Do not wear sandals.  If you use a stepladder:  Make sure that it is fully opened. Do not climb a closed stepladder.  Make sure that both sides of the stepladder are locked into place.  Ask someone to hold it for you, if possible.  Clearly mark and make sure that you can see:  Any grab bars or handrails.  First and last steps.  Where the edge of each step is.  Use tools that help you move around (mobility aids) if they are needed. These include:  Canes.  Walkers.  Scooters.  Crutches.  Turn on the lights when you go into a dark area. Replace any light bulbs as soon as they burn out.  Set up your furniture so you have a clear path.  Avoid moving your furniture around.  If any of your floors are uneven, fix them.  If there are any pets around you, be aware of where they are.  Review your medicines with your doctor. Some medicines can make you feel dizzy. This can increase your chance of falling. Ask your doctor what other things that you can do to help prevent falls. This information is not intended to replace advice given to you by your health care provider. Make sure you discuss any questions you have with your health care provider. Document Released: 01/11/2009 Document Revised: 08/23/2015 Document Reviewed: 04/21/2014 Elsevier Interactive Patient Education  2017 [employer]  

## 2016-07-25 ENCOUNTER — Ambulatory Visit: Payer: Medicare Other | Admitting: Nurse Practitioner

## 2016-07-28 ENCOUNTER — Telehealth: Payer: Self-pay | Admitting: Nurse Practitioner

## 2016-07-28 MED ORDER — GLUCOSE BLOOD VI STRP: ORAL_STRIP | 11 refills | Status: DC

## 2016-07-28 NOTE — Telephone Encounter (Signed)
What is the name of the medication?test strips  Have you contacted your pharmacy to request a refill? yes  Which pharmacy would you like this sent to? [facility] at [phone]   Patient notified that their request is being sent to the clinical staff for review and that they should receive a call once it is complete. If they do not receive a call within 24 hours they can check with their pharmacy or our office.

## 2016-07-30 ENCOUNTER — Other Ambulatory Visit: Payer: Self-pay | Admitting: Nurse Practitioner

## 2016-07-30 DIAGNOSIS — I10 Essential (primary) hypertension: Secondary | ICD-10-CM

## 2016-08-08 ENCOUNTER — Telehealth: Payer: Self-pay | Admitting: Nurse Practitioner

## 2016-08-08 NOTE — Telephone Encounter (Signed)
Has to be seen to get pain meds needs pain management appointment

## 2016-08-08 NOTE — Telephone Encounter (Signed)
What is the name of the medication? Hydrocodone . Says she only got one script last visit.  Have you contacted your pharmacy to request a refill? yes  Which pharmacy would you like this sent to? [facility].   Patient notified that their request is being sent to the clinical staff for review and that they should receive a call once it is complete. If they do not receive a call within 24 hours they can check with their pharmacy or our office.

## 2016-08-13 ENCOUNTER — Encounter: Payer: Self-pay | Admitting: Nurse Practitioner

## 2016-08-13 ENCOUNTER — Ambulatory Visit (INDEPENDENT_AMBULATORY_CARE_PROVIDER_SITE_OTHER): Payer: Medicare Other | Admitting: Nurse Practitioner

## 2016-08-13 VITALS — BP 144/82 | HR 88 | Temp 97.2°F | Ht 62.0 in | Wt 149.0 lb

## 2016-08-13 DIAGNOSIS — G8929 Other chronic pain: Secondary | ICD-10-CM | POA: Diagnosis not present

## 2016-08-13 DIAGNOSIS — M545 Low back pain: Secondary | ICD-10-CM | POA: Diagnosis not present

## 2016-08-13 DIAGNOSIS — M961 Postlaminectomy syndrome, not elsewhere classified: Secondary | ICD-10-CM | POA: Diagnosis not present

## 2016-08-13 DIAGNOSIS — R52 Pain, unspecified: Secondary | ICD-10-CM

## 2016-08-13 MED ORDER — HYDROCODONE-ACETAMINOPHEN 7.5-325 MG PO TABS: 1.0000 | ORAL_TABLET | Freq: Two times a day (BID) | ORAL | 0 refills | Status: DC

## 2016-08-13 NOTE — Progress Notes (Signed)
   Subjective:    Patient ID: [name], female    DOB: December 16, 1955, 61 y.o.   MRN: [id number]  HPI  Patient comes in today for pan management: Pain assessment: Cause of pain- no injuries Pain location- low back pain and multiple joint pain Pain on scale of 1-10- 8/10 Frequency- What increases pain-daily What makes pain Better-pain meds help Effects on ADL - makes it difficult Any change in general medical condition-stress level has increased  Current medications- lortab 7.5/325 bid Effectiveness of current meds-helps some Adverse reactions form pain meds-none Morphine equivalent- 20  Pill count performed-No Urine drug screen- No Was the NCCSR reviewed- not today  If yes were their any concerning findings?N/a    Review of Systems  Constitutional: Negative.   HENT: Negative.   Respiratory: Negative.   Cardiovascular: Negative.   Genitourinary: Negative.   Neurological: Negative.   Psychiatric/Behavioral: Negative.   All other systems reviewed and are negative.      Objective:   Physical Exam  Constitutional: She is oriented to person, place, and time. She appears well-developed and well-nourished. No distress.  Cardiovascular: Normal rate, regular rhythm and normal heart sounds.   Pulmonary/Chest: Effort normal and breath sounds normal.  Musculoskeletal:  Decrease ROM of lumbar spine due to painon flexion and extension  Neurological: She is alert and oriented to person, place, and time.  Skin: Skin is warm.  Psychiatric: She has a normal mood and affect. Her behavior is normal. Judgment and thought content normal.   BP (!) 144/82   Pulse 88   Temp 97.2 F (36.2 C) (Oral)   Ht 5\' 2"  (1.575 m)   Wt 149 lb (67.6 kg)   BMI 27.25 kg/m      Assessment & Plan:   1. Postlaminectomy syndrome, lumbar region   2. Chronic midline low back pain without sciatica   3. Pain management    Meds ordered this encounter  Medications  . HYDROcodone-acetaminophen  (NORCO) 7.5-325 MG tablet    Sig: Take 1 tablet by mouth 2 (two) times daily.    Dispense:  60 tablet    Refill:  0    Order Specific Question:   Supervising Provider    Answer:   VINCENT, CAROL L [[id number]]   Moist heat to back No heavy lifting RTO prn  [name] 07-29-1975, FNP

## 2016-08-20 ENCOUNTER — Ambulatory Visit (INDEPENDENT_AMBULATORY_CARE_PROVIDER_SITE_OTHER): Payer: Medicare Other | Admitting: *Deleted

## 2016-08-20 ENCOUNTER — Ambulatory Visit: Payer: Medicare Other | Admitting: Nurse Practitioner

## 2016-08-20 VITALS — BP 118/69 | HR 67 | Ht 62.0 in | Wt 151.0 lb

## 2016-08-20 DIAGNOSIS — Z0000 Encounter for general adult medical examination without abnormal findings: Secondary | ICD-10-CM

## 2016-08-20 NOTE — Patient Instructions (Signed)
  Ms. [name] ,  Thank you for taking time to come for your Medicare Wellness Visit. I appreciate your ongoing commitment to your health goals. Please review the following plan we discussed and let me know if I can assist you in the future.   These are the goals we discussed: Goals    . Exercise 3x per week (30 min per time)          Increase activity level as tolerated. Aim for 30 minutes of light walking 3 times a week. Walk with a partner.   Do chair exercises daily. Refer to handouts.     . Have 3 meals a day       This is a list of the screening recommended for you and due dates:  Health Maintenance  Topic Date Due  . Pap Smear  05/07/2015  . Mammogram  11/20/2016*  . Flu Shot  10/29/2016  . Tetanus Vaccine  08/04/2021  . Colon Cancer Screening  04/26/2022  .  Hepatitis C: One time screening is recommended by Center for Disease Control  (CDC) for  adults born from [birth year] through 1964.   Completed  . HIV Screening  Completed  *Topic was postponed. The date shown is not the original due date.   Review Advance Directives with family. Bring signed, notarized copy to our office to put on file. Follow up with Henrene Pastor, our clinical pharmacist, for nutrition counseling.

## 2016-08-21 ENCOUNTER — Ambulatory Visit: Payer: Medicare Other | Admitting: Nurse Practitioner

## 2016-08-21 NOTE — Progress Notes (Signed)
Subjective:   [name] is a 61 y.o. female who presents for an Initial Medicare Annual Wellness Visit. Ms Saca has four daughters, 22 grandchildren, 6 great grandchildren. She had one son to pass away at 59 months old. She is disabled and lives in an apartment with her boyfriend. She helps to care for her brother who has drug/alcohol addiction. She uses the elevator in the apartment building instead of the stairs.   Review of Systems     Cardiac Risk Factors include: hypertension;sedentary lifestyle;family history of premature cardiovascular disease;obesity (BMI >30kg/m2)  GI: Loose stools/diarrhea after eating  Other systems negative    Objective:    Today's Vitals   08/20/16 1501  BP: 118/69  Pulse: 67  Weight: 151 lb (68.5 kg)  Height: 5\' 2"  (1.575 m)   Body mass index is 27.62 kg/m.   Current Medications (verified) Outpatient Encounter Prescriptions as of 08/20/2016  Medication Sig  . acyclovir ointment (ZOVIRAX) 5 % APPLY TO AFFECTED AREA EVERY 3 HOURS  . albuterol (PROVENTIL HFA;VENTOLIN HFA) 108 (90 Base) MCG/ACT inhaler Inhale 2 puffs into the lungs every 6 (six) hours as needed for wheezing or shortness of breath.  [name] albuterol (PROVENTIL) (2.5 MG/3ML) 0.083% nebulizer solution USE 1 VIAL IN NEBULIZER EVERY 4 HOURS AS NEEDED FOR WHEEZING.  Virgel Bouquet ELLIPTA 200-25 MCG/INH AEPB INHALE 1 PUFF DAILY.  . busPIRone (BUSPAR) 10 MG tablet TAKE (1) TABLET TWICE DAILY.  . butalbital-acetaminophen-caffeine (FIORICET, ESGIC) 50-325-40 MG tablet TAKE 1 TABLET TWICE DAILY AS NEEDED FOR HEADACHE.  [name] dexlansoprazole (DEXILANT) 60 MG capsule Take 1 capsule (60 mg total) by mouth daily.  . diazepam (VALIUM) 5 MG tablet TAKE 1 TABLET EVERY 12 HOURS AS NEEDED FOR ANXIETY.  . DULoxetine (CYMBALTA) 60 MG capsule Take 1 capsule (60 mg total) by mouth at bedtime.  [name] escitalopram (LEXAPRO) 20 MG tablet TAKE (1) TABLET BY MOUTH ONCE DAILY.  [name] estradiol (ESTRACE VAGINAL) 0.1 MG/GM  vaginal cream Place 1 Applicatorful vaginally at bedtime.  . fludrocortisone (FLORINEF) 0.1 MG tablet Take 2 tablets (0.2 mg total) by mouth daily.  . furosemide (LASIX) 40 MG tablet TAKE (1) TABLET BY MOUTH ONCE DAILY.  [name] glucose blood (ONETOUCH VERIO) test strip Check blood sugar daily-- dx E11.9  . HYDROcodone-acetaminophen (NORCO) 7.5-325 MG tablet Take 1 tablet by mouth 2 (two) times daily.  [name] ipratropium (ATROVENT) 0.02 % nebulizer solution USE 1 VIAL IN NEBULIZER EVERY 4 HOURS AS NEEDED FOR WHEEZING.  [name] lamoTRIgine (LAMICTAL) 100 MG tablet TAKE (1) TABLET BY MOUTH ONCE DAILY.  [name] levETIRAcetam (KEPPRA) 500 MG tablet TAKE (1) TABLET TWICE DAILY.  [name] Loratadine 10 MG CAPS Take 1 capsule (10 mg total) by mouth daily.  . Menthol, Topical Analgesic, (BIOFREEZE ROLL-ON) 4 % GEL Apply topically.  . methocarbamol (ROBAXIN) 500 MG tablet Take 500 mg by mouth every 8 (eight) hours as needed for muscle spasms.  . miconazole (MICOTIN) 2 % cream Apply 1 application topically 2 (two) times daily.  . midodrine (PROAMATINE) 5 MG tablet TAKE (1/2) TABLET THREE TIMES DAILY.  . nitroGLYCERIN (NITROSTAT) 0.4 MG SL tablet PLACE ONE (1) TABLET UNDER TONGUE EVERY 5 MINUTES UP TO (3) DOSES AS NEEDED FOR CHEST PAIN.  [name] ONETOUCH DELICA LANCETS 33G MISC 1 daily to keep diary of blood sugars-- Dx E11.9  . oxybutynin (DITROPAN) 5 MG tablet Take 1 tablet (5 mg total) by mouth 2 (two) times daily.  . ranitidine (ZANTAC) 150 MG tablet Take 1 tablet (150  mg total) by mouth at bedtime.  . rosuvastatin (CRESTOR) 10 MG tablet Take 1 tablet (10 mg total) by mouth daily.  . traZODone (DESYREL) 150 MG tablet TAKE (1) TABLET DAILY AT BEDTIME.  . valACYclovir (VALTREX) 500 MG tablet TAKE (1) TABLET TWICE DAILY.  [name] tolterodine (DETROL) 2 MG tablet TAKE (1) TABLET TWICE DAILY.  . [DISCONTINUED] fluconazole (DIFLUCAN) 150 MG tablet 1 po q week x 2 weeks   No facility-administered encounter medications on file as of 08/20/2016.      Allergies (verified) Codeine; Morphine and related; Ambien [zolpidem tartrate]; Lyrica [pregabalin]; and Neurontin [gabapentin]   History: Past Medical History:  Diagnosis Date  . Anginal pain (HCC)    last time   . Anxiety   . Arthritis    RHEUMATOID  . Asthma   . Bipolar 1 disorder (HCC)   . COPD (chronic obstructive pulmonary disease) (HCC)   . Coronary artery disease    reported hx of "MI";  Echo 2009 with normal LVF;  Myoview 05/2011: no ischemia  . Depression   . Dyslipidemia   . Esophageal stricture   . Fibromyalgia   . GERD (gastroesophageal reflux disease)   . H/O hiatal hernia   . Head injury, unspecified   . Herpes simplex infection   . History of kidney stones   . Hyperlipidemia   . Hypertension   . Insomnia   . Myocardial infarction (HCC)   . Osteoporosis   . Pneumonia    hx  . Seizures (HCC)    last 3 weeks ago- prescribed keppra -never took didnt like side effects read about online  . Shortness of breath   . Sleep apnea    ? neg  . Spinal stenosis of lumbar region   . Spondylolisthesis   . Status post placement of implantable loop recorder   . Supraventricular tachycardia (HCC)   . Syncope and collapse    s/p ILR; no arhythmogenic cause identified  . UTI (lower urinary tract infection)    Past Surgical History:  Procedure Laterality Date  . BACK SURGERY    . BREAST SURGERY     lumpectomy  . CHOLECYSTECTOMY    . CYSTOSCOPY     stone  . DOPPLER ECHOCARDIOGRAPHY  2009  . head up tilt table testing  06/15/2007   [name]  . HEMORRHOID SURGERY    . insertion of implatable loop recorder  08/11/2007   [name]  . POSTERIOR CERVICAL FUSION/FORAMINOTOMY N/A 12/19/2013   Procedure: RIGHT C3-4.C4-5 AND C5-6 FORAMINOTOMIES;  Surgeon: Kerrin Champagne, MD;  Location: [facility];  Service: Orthopedics;  Laterality: N/A;  . TUBAL LIGATION     Family History  Problem Relation Age of Onset  . Heart attack Father   . Cancer Father   . Mental illness  Father   . Cancer Mother   . Mental illness Mother   . Heart attack Brother        stents  . Alcohol abuse Brother   . Heart disease Brother   . Drug abuse Brother   . Colon cancer Maternal Aunt   . Cirrhosis Brother   . Stomach cancer Neg Hx    Social History   Occupational History  . Not on file.   Social History Main Topics  . Smoking status: Never Smoker  . Smokeless tobacco: Never Used  . Alcohol use No  . Drug use: No  . Sexual activity: Yes    Tobacco Counseling No tobacco use  Activities  of Daily Living In your present state of health, do you have any difficulty performing the following activities: 08/20/2016  Hearing? N  Vision? Y  Difficulty concentrating or making decisions? Y  Walking or climbing stairs? Y  Dressing or bathing? N  Doing errands, shopping? N  Preparing Food and eating ? N  Using the Toilet? N  In the past six months, have you accidently leaked urine? Y  Do you have problems with loss of bowel control? N  Managing your Medications? N  Managing your Finances? N  Housekeeping or managing your Housekeeping? N  Some recent data might be hidden  Memory problems since head injury as a child. Uses the elevator in her apartment building instead of the stairs. Reports having cataracts but they are not advanced enough for insurance to cover removal.  Can see with glasses on.  Some stress incontinence.   Immunizations and Health Maintenance Immunization History  Administered Date(s) Administered  . Influenza Whole 12/19/2011  . Influenza,inj,Quad PF,36+ Mos 01/25/2013, 01/05/2014, 02/12/2016  . Pneumococcal Conjugate-13 01/05/2014  . Pneumococcal Polysaccharide-23 11/30/2011  . Tdap 08/05/2011  . Zoster 01/02/2012   Health Maintenance Due  Topic Date Due  . PAP SMEAR  05/07/2015    Patient Care Team: Bennie Pierini, FNP as PCP - General (Nurse Practitioner) Kerrin Champagne, MD as Consulting Physician (Orthopedic Surgery)      Assessment:   This is a routine wellness examination for [facility]  Hearing/Vision screen No hearing or vision deficit noted during visit  Dietary issues and exercise activities discussed: Current Exercise Habits: The patient does not participate in regular exercise at present, Exercise limited by: cardiac condition(s)  Goals    . Exercise 3x per week (30 min per time)          Increase activity level as tolerated. Aim for 30 minutes of light walking 3 times a week. Walk with a partner.   Do chair exercises daily. Refer to handouts.     . Have 3 meals a day      Patient reports eating very little. She rarely eats more than one meal per day. Denies difficulty obtaining or affording food. States that she isn't hungry.   Depression Screen PHQ 2/9 Scores 08/20/2016 07/14/2016 07/01/2016 06/24/2016 06/10/2016 05/26/2016 05/19/2016  PHQ - 2 Score 0 0 0 4 2 2 6   PHQ- 9 Score - - - 14 11 11 24     Fall Risk Fall Risk  08/20/2016 07/14/2016 07/01/2016 06/24/2016 06/10/2016  Falls in the past year? Yes Yes No Yes Yes  Number falls in past yr: 1 1 - - 2 or more  Injury with Fall? No No - - No  Risk Factor Category  - - - - High Fall Risk  Risk for fall due to : Other (Comment) - - - -  Risk for fall due to (comments): syncopal episodes - - - -  Follow up Falls prevention discussed - - - Falls prevention discussed    Cognitive Function: MMSE - Mini Mental State Exam 08/20/2016  Orientation to time 4  Orientation to Place 5  Registration 3  Attention/ Calculation 5  Recall 0  Language- name 2 objects 2  Language- repeat 1  Language- follow 3 step command 3  Language- read & follow direction 1  Write a sentence 1  Copy design 1  Total score 26  Patient reports difficulty with memory due to head injury as a child        Screening  Tests Health Maintenance  Topic Date Due  . PAP SMEAR  05/07/2015  . MAMMOGRAM  11/20/2016 (Originally 06/26/2016)  . INFLUENZA VACCINE  10/29/2016  .  TETANUS/TDAP  08/04/2021  . COLONOSCOPY  04/26/2022  . Hepatitis C Screening  Completed  . HIV Screening  Completed      Plan:   Pap smear scheduled. Appt scheduled with Tammy Eckard to discuss nutrition. Review Advance Directives with family and return a signed/notarized copy to our office. Patient was signed up for My Chart  I have personally reviewed and noted the following in the patient's chart:   . Medical and social history . Use of alcohol, tobacco or illicit drugs  . Current medications and supplements . Functional ability and status . Nutritional status . Physical activity . Advanced directives . List of other physicians . Hospitalizations, surgeries, and ER visits in previous 12 months . Vitals . Screenings to include cognitive, depression, and falls . Referrals and appointments  In addition, I have reviewed and discussed with patient certain preventive protocols, quality metrics, and best practice recommendations. A written personalized care plan for preventive services as well as general preventive health recommendations were provided to patient.     Demetrios Loll, RN  08/21/2016    I have reviewed and agree with the above AWV documentation.   Jannifer Rodney, FNP

## 2016-08-26 ENCOUNTER — Telehealth: Payer: Self-pay | Admitting: Nurse Practitioner

## 2016-08-26 MED ORDER — AMOXICILLIN 875 MG PO TABS: 875.0000 mg | ORAL_TABLET | Freq: Two times a day (BID) | ORAL | 0 refills | Status: DC

## 2016-08-26 NOTE — Telephone Encounter (Signed)
Has ST, fever, hoarse -  no cough, congestion, or nasal drainage. Wants something called in

## 2016-08-26 NOTE — Telephone Encounter (Signed)
What symptoms do you have? Sore throat. Had a fever  How long have you been sick? Started two days ago  Have you been seen for this problem? no  If your provider decides to give you a prescription, which pharmacy would you like for it to be sent to? [facility]   Patient informed that this information will be sent to the clinical staff for review and that they should receive a follow up call.

## 2016-08-26 NOTE — Telephone Encounter (Signed)
amoxicillin sent to pharmacy 

## 2016-09-01 ENCOUNTER — Other Ambulatory Visit: Payer: Self-pay | Admitting: Nurse Practitioner

## 2016-09-02 ENCOUNTER — Ambulatory Visit (INDEPENDENT_AMBULATORY_CARE_PROVIDER_SITE_OTHER): Payer: Medicare HMO | Admitting: Pharmacist

## 2016-09-02 ENCOUNTER — Other Ambulatory Visit: Payer: Self-pay | Admitting: Nurse Practitioner

## 2016-09-02 ENCOUNTER — Encounter: Payer: Self-pay | Admitting: Pharmacist

## 2016-09-02 VITALS — BP 117/68 | HR 68 | Ht 62.0 in | Wt 149.0 lb

## 2016-09-02 DIAGNOSIS — R7303 Prediabetes: Secondary | ICD-10-CM | POA: Diagnosis not present

## 2016-09-02 LAB — BAYER DCA HB A1C WAIVED: HB A1C (BAYER DCA - WAIVED): 5.6 % (ref <7.0)

## 2016-09-02 LAB — GLUCOSE HEMOCUE WAIVED: GLU HEMOCUE WAIVED: 105 mg/dL — AB (ref 65–99)

## 2016-09-02 NOTE — Progress Notes (Signed)
Patient ID: [name], female   DOB: 1955/10/16, 61 y.o.   MRN: [id number]   Patient was seen by clinic nurse last week and referred to me for nutrition counseling. [name] reports that she has been told that she has pre diabetes.  Her brother was just diagnosed with type 2 DM.  No other family members with DM.   [name] has been having polydipsia, fatigue.  She use to check BG at home but her insurance is not covering test strips. She reports BG at home of 100 to 200.   Current Outpatient Prescriptions (Endocrine & Metabolic):  .  fludrocortisone (FLORINEF) 0.1 MG tablet, Take 2 tablets (0.2 mg total) by mouth daily.  Current Outpatient Prescriptions (Cardiovascular):  .  furosemide (LASIX) 40 MG tablet, TAKE (1) TABLET BY MOUTH ONCE DAILY. .  midodrine (PROAMATINE) 5 MG tablet, TAKE (1/2) TABLET THREE TIMES DAILY. .  nitroGLYCERIN (NITROSTAT) 0.4 MG SL tablet, PLACE ONE (1) TABLET UNDER TONGUE EVERY 5 MINUTES UP TO (3) DOSES AS NEEDED FOR CHEST PAIN. .  rosuvastatin (CRESTOR) 10 MG tablet, Take 1 tablet (10 mg total) by mouth daily.  Current Outpatient Prescriptions (Respiratory):  .  albuterol (PROVENTIL HFA;VENTOLIN HFA) 108 (90 Base) MCG/ACT inhaler, Inhale 2 puffs into the lungs every 6 (six) hours as needed for wheezing or shortness of breath. [name]  albuterol (PROVENTIL) (2.5 MG/3ML) 0.083% nebulizer solution, USE 1 VIAL IN NEBULIZER EVERY 4 HOURS AS NEEDED FOR WHEEZING. .  BREO ELLIPTA 200-25 MCG/INH AEPB, INHALE 1 PUFF DAILY. [name]  ipratropium (ATROVENT) 0.02 % nebulizer solution, USE 1 VIAL IN NEBULIZER EVERY 4 HOURS AS NEEDED FOR WHEEZING. [name]  Loratadine 10 MG CAPS, Take 1 capsule (10 mg total) by mouth daily.  Current Outpatient Prescriptions (Analgesics):  .  butalbital-acetaminophen-caffeine (FIORICET, ESGIC) 50-325-40 MG tablet, TAKE 1 TABLET TWICE DAILY AS NEEDED FOR HEADACHE. [name]  HYDROcodone-acetaminophen (NORCO) 7.5-325 MG tablet, Take 1 tablet by mouth 2 (two) times  daily.   Current Outpatient Prescriptions (Other):  .  acyclovir ointment (ZOVIRAX) 5 %, APPLY TO AFFECTED AREA EVERY 3 HOURS .  amoxicillin (AMOXIL) 875 MG tablet, Take 1 tablet (875 mg total) by mouth 2 (two) times daily. 1 po BID .  busPIRone (BUSPAR) 10 MG tablet, TAKE (1) TABLET TWICE DAILY. [name]  dexlansoprazole (DEXILANT) 60 MG capsule, Take 1 capsule (60 mg total) by mouth daily. .  diazepam (VALIUM) 5 MG tablet, TAKE 1 TABLET EVERY 12 HOURS AS NEEDED FOR ANXIETY. .  DULoxetine (CYMBALTA) 60 MG capsule, Take 1 capsule (60 mg total) by mouth at bedtime. [name]  escitalopram (LEXAPRO) 20 MG tablet, TAKE (1) TABLET BY MOUTH ONCE DAILY. [name]  estradiol (ESTRACE VAGINAL) 0.1 MG/GM vaginal cream, Place 1 Applicatorful vaginally at bedtime. [name]  glucose blood (ONETOUCH VERIO) test strip, Check blood sugar daily-- dx E11.9 .  lamoTRIgine (LAMICTAL) 100 MG tablet, TAKE (1) TABLET BY MOUTH ONCE DAILY. [name]  levETIRAcetam (KEPPRA) 500 MG tablet, TAKE (1) TABLET TWICE DAILY. [name]  Menthol, Topical Analgesic, (BIOFREEZE ROLL-ON) 4 % GEL, Apply topically. .  methocarbamol (ROBAXIN) 500 MG tablet, Take 500 mg by mouth every 8 (eight) hours as needed for muscle spasms. .  miconazole (MICOTIN) 2 % cream, Apply 1 application topically 2 (two) times daily. [name] DELICA LANCETS 33G MISC, 1 daily to keep diary of blood sugars-- Dx E11.9 .  oxybutynin (DITROPAN) 5 MG tablet, Take 1 tablet (5 mg total) by mouth 2 (two) times daily. .  ranitidine (  ZANTAC) 150 MG tablet, Take 1 tablet (150 mg total) by mouth at bedtime. .  tolterodine (DETROL) 2 MG tablet, TAKE (1) TABLET TWICE DAILY. .  traZODone (DESYREL) 150 MG tablet, TAKE (1) TABLET DAILY AT BEDTIME. .  valACYclovir (VALTREX) 500 MG tablet, TAKE (1) TABLET TWICE DAILY.   Diet - pt eats only 1 meal per day most days.   She drink sweet tea, water or kool-aide She has been advised to eat low CHO foods in past by her PCP as tries to limit sugar intake.   Exercise -  has not been exercising until the last 1-2 weeks.  Has been going to [employer] .    Objective:    BP 117/68   Pulse 68   Ht 5\' 2"  (1.575 m)   Wt 149 lb (67.6 kg)   BMI 27.25 kg/m   FBG was 105 today  A1c was 5.6% today  A1c was 5.8% (02/2016)   Assessment:   Pre Diabetes   Plan:    1.  Education provided:  Discussed pre-diabetes and how diagnosed.  2.  Discussed weight loss of about 5 to 10 lbs, low CHO / fat diet, increase exercise to  decrease chance of developing DM 3. CHO counting diet discussed.  Reviewed CHO amount in various foods and how to read nutrition labels.  Discussed recommended serving sizes.  4.  Recommended increase physical activity - goal is 150 minutes per week 5.   Orders Placed This Encounter  Procedures  . Bayer DCA Hb A1c Waived  . Glucose Hemocue Waived   6.  RTC to see PCP in July 2018

## 2016-09-02 NOTE — Patient Instructions (Signed)
Prediabetes Many people have heard about type 2 diabetes, but its common precursor, prediabetes, doesn't get as much attention. Prediabetes is estimated by CDC to affect 86 million Americans (this includes 51% of people 65 years and older), and an estimated 90% of people with prediabetes don't even know it. According to the CDC, 15-30% of these individuals will develop type 2 diabetes within five years. In other words, as many as 26 million people that currently have prediabetes could develop type 2 diabetes by 2020, effectively doubling the number of people with type 2 diabetes in the [place].  What is prediabetes? Prediabetes is a condition where blood sugar levels are higher than normal, but not high enough to be diagnosed as type 2 diabetes. This occurs when the body has problems in processing glucose properly, and sugar starts to build up in the bloodstream instead of fueling cells in muscles and tissues. Insulin is the hormone that tells cells to take up glucose, and in prediabetes, people typically initially develop insulin resistance (where the body's cells can't respond to insulin as well), and over time (if no actions are taken to reverse the situation) the ability to produce sufficient insulin is reduced. People with prediabetes also commonly have high blood pressure as well as abnormal blood lipids (e.g. cholesterol). These often occur prior to the rise of blood glucose levels.  What are the symptoms of prediabetes? People typically do not have symptoms of prediabetes, which is partially why up to 90% of people don't know they have it. The ADA reports that some people with prediabetes may develop symptoms of type 2 diabetes, though even many people diagnosed with type 2 diabetes show little or no symptoms initially at diagnosis.  How is prediabetes diagnosed? According to the American Diabetes Association, prediabetes can be diagnosed through one of the following tests: 1. A glycated hemoglobin  test, also known as HbA1c or simply A1c, gives an idea of the body's average blood sugar levels from the past two or three months. It is usually done with a small drop of blood from a fingerstick or as part of having blood taken in a doctor's office, hospital, or laboratory. A1c Level Diagnosis  Less than 5.7% Normal  5.7% to 6.4% Prediabetes  6.5% and higher Diabetes  2. A fasting plasma glucose (FPG) test measures a person's blood glucose level after fasting (not eating) for eight hours - this is typically done in the morning. If a test shows positive for prediabetes, a second test should be taken on a different day to confirm the diagnosis. FPG Level Diagnosis  Less than 100 mg/dl Normal  100 mg/dl to 125 mg/dl Prediabetes  126 mg/dl and higher Diabetes   Who is at risk of developing prediabetes? A well-known paper published in the Lancet in 2010 recommends screening for type 2 diabetes (which would also screen for prediabetes) every 3-5 years in all adults over the age of 45, regardless of other risk factors. Overweight and obese adults (a BMI >25 kg/m2) are also at significantly greater risk for developing prediabetes, as well as people with a family history of type 2 diabetes. According to the CDC, several other factors can have moderate influences on prediabetes risk in addition to age, weight, and family history: People with an African American, Hispanic/Latino, American Indian, Asian American, or Pacific Islander racial or ethnic background. The 2015 ADA Standards of Medical Care recommendations suggest Asian Americans with a BMI of 23 or above be screened for type 2 diabetes.    Women with a history of diabetes during pregnancy ("gestational diabetes") or have given birth to a baby weighing nine pounds or more. People who are physically active fewer than three times a week. The CDC offers a fast, online screening test for evaluating the risk for prediabetes. The ADA also offers a screening  test to assess type 2 diabetes risk. Of course, these tests do not themselves confirm a prediabetes diagnosis, but just if someone may be at higher risk of developing it.  Why do people develop prediabetes? Prediabetes develops through a combination of factors that are still being investigated. For sure, lifestyle factors (food, exercise, stress, sleep) play a role, but family history and genetics certainly do as well. It is easy to assume that prediabetes is the result of being overweight, but the relationship is not that simple. While obesity is one underlying cause of insulin resistance, many overweight individuals may never develop prediabetes or type 2 diabetes, and a minority of people with prediabetes have never been overweight. To make matters worse, it can be increasingly difficult to make healthy choices in today's toxic food environment that steers all of us to make the wrong food choices, and there are many factors that can contribute to weight gain in addition to diet.  Is a prediabetes diagnosis serious? There has been significant debate around the term 'prediabetes,' and whether it should be considered cause for alarm. On the one hand, it serves as a risk factor for type 2 diabetes and a host of other complications, including heart disease, and ultimately prediabetes implies that a degree of metabolic problems have started to occur in the body. On the other hand, it places a diagnosis on many people who may never develop type 2 diabetes. Again, according to the CDC, 15-30% of those with prediabetes will develop type 2 diabetes within five years. However, a 2012 Lancet article cites 5-10% of those with prediabetes each year will also revert back to healthy blood sugars. What's critical is not necessarily the cutoff itself, but where someone falls within the ranges listed above. The level of risk of developing type 2 diabetes is closely related to A1c or FPG at diagnosis. Those in the higher ranges  (A1c closer to 6.4%, FPG closer to 125 mg/dl) are much more likely to progress to type 2 diabetes, whereas those at lower ranges (A1c closer to 5.7%, FPG closer to 100 mg/dl) are relatively more likely to revert back to normal glucose levels or stay within the prediabetes range. Age of diagnosis and the level of insulin production still occurring at diagnosis also impact the chances of reverting to normoglycemia (normal blood sugar levels).  What can people with prediabetes do to avoid the progression from prediabetes to type 2 diabetes? The most important action people diagnosed with prediabetes can take is to focus on living a healthy lifestyle. This includes making healthy food choices, controlling portions, and increasing physical activity. Regarding weight control, research shows losing 5-7% (often about 10-20 lbs.) from your initial body weight and keeping off as much of that weight over time as possible is critical to lowering the risk of type 2 diabetes. This task is of course easier said than done, but sustained weight loss over time can be key to improving health and delaying or preventing the onset of type 2 diabetes. Several prediabetes interventions exist based on evidence from the landmark Diabetes Prevention Program (DPP) study. The DPP study reported that moderate weight loss (5-7% of body weight, or ~10-15 lbs.   for someone weighing 200 lbs.), counseling, and education on healthy eating and behavior reduced the risk of developing type 2 diabetes by 58%. Data presented at the ADA 2014 conference showed that after 15 years of follow-up of the DPP study groups, the results were still encouraging: 27% of those in the original lifestyle group had a significant reduction in type 2 diabetes progression compared to the control group. If you or someone you know has been told they have prediabetes, here are a few helpful resources: In-person diabetes prevention programs: The CDC offers a one year long  lifestyle change program through its National Diabetes Prevention Program (NDPP) at various locations throughout the [place] to help participants adopt healthy habits and prevent or delay progression to type 2 diabetes. This program is a major undertaking by the CDC to translate the findings from the DPP study into a real world setting, a significant effort indeed! Online diabetes prevention programs: The CDC has now given pending recognition status to three digital prevention programs: [facility], [facility], and [facility]. These offer the same one year long educational curriculum as the DPP study, but in an online format. Some insurance companies and employers cover these programs, and you can find more information at the links above. These digital versions are excellent options for those who live far away from NDPP locations or who prefer the anonymity and convenience of doing the program online. Metformin: The DPP study found that metformin, the safest first-line therapy for type 2 diabetes, may help delay the onset of type 2 diabetes in people with prediabetes. Participants who took the low-cost generic drug had a 31% reduced risk of developing type 2 diabetes compared to the control group (those not on metformin or intensive lifestyle intervention). Again, 15-year follow up data showed that 17% of those on metformin continued to have a significant reduction in type 2 progression. At this time, metformin (or any other medication, for that matter) is not currently FDA approved for prediabetes, and it is sometimes prescribed "off-label" by a healthcare provider. Your healthcare provider can give you more information and determine whether metformin is a good option for you.  Can prediabetes be "cured"? In the early stages of prediabetes (and type 2 diabetes), diligent attention to food choices and activity, and most importantly weight loss, can improve blood sugar numbers, effectively "reversing" the disease  and reducing the odds of developing type 2 diabetes. However, some people may have underlying factors (such as family history and genetics) that put them at a greater risk of type 2 diabetes, meaning they will always require careful attention to blood sugar levels and lifestyle choices. Returning to old habits will likely put someone back on the road to prediabetes, and eventually, type 2 diabetes   

## 2016-09-04 NOTE — Telephone Encounter (Signed)
Last filled 06/30/16, last seen 08/13/16. Call in

## 2016-09-04 NOTE — Telephone Encounter (Signed)
Rx called in to pharmacy. 

## 2016-09-04 NOTE — Telephone Encounter (Signed)
Please call in fioricet with 0 refills 

## 2016-09-12 ENCOUNTER — Other Ambulatory Visit: Payer: Self-pay | Admitting: Nurse Practitioner

## 2016-09-12 DIAGNOSIS — M545 Low back pain, unspecified: Secondary | ICD-10-CM

## 2016-09-12 DIAGNOSIS — G8929 Other chronic pain: Secondary | ICD-10-CM

## 2016-09-15 ENCOUNTER — Telehealth: Payer: Self-pay | Admitting: Nurse Practitioner

## 2016-09-15 NOTE — Telephone Encounter (Signed)
Left detailed message that pt needs to call and schedule appt with MMM

## 2016-09-17 ENCOUNTER — Other Ambulatory Visit: Payer: Self-pay

## 2016-09-17 DIAGNOSIS — I10 Essential (primary) hypertension: Secondary | ICD-10-CM

## 2016-09-17 MED ORDER — ACCU-CHEK SMARTVIEW CONTROL VI LIQD: 0 refills | Status: DC

## 2016-09-17 MED ORDER — FUROSEMIDE 40 MG PO TABS: ORAL_TABLET | ORAL | 0 refills | Status: DC

## 2016-09-17 MED ORDER — FLUDROCORTISONE ACETATE 0.1 MG PO TABS: 0.2000 mg | ORAL_TABLET | Freq: Every day | ORAL | 0 refills | Status: DC

## 2016-09-17 MED ORDER — BD SWAB SINGLE USE REGULAR PADS: MEDICATED_PAD | 0 refills | Status: DC

## 2016-09-17 MED ORDER — GLUCOSE BLOOD VI STRP: ORAL_STRIP | 12 refills | Status: DC

## 2016-09-17 MED ORDER — ACCU-CHEK FASTCLIX LANCETS MISC: 0 refills | Status: DC

## 2016-09-23 ENCOUNTER — Other Ambulatory Visit: Payer: Self-pay | Admitting: Nurse Practitioner

## 2016-09-23 ENCOUNTER — Encounter: Payer: Self-pay | Admitting: Nurse Practitioner

## 2016-09-23 ENCOUNTER — Ambulatory Visit (INDEPENDENT_AMBULATORY_CARE_PROVIDER_SITE_OTHER): Payer: Medicare HMO | Admitting: Nurse Practitioner

## 2016-09-23 VITALS — BP 139/87 | HR 57 | Temp 98.9°F | Ht 62.0 in | Wt 146.0 lb

## 2016-09-23 DIAGNOSIS — G8929 Other chronic pain: Secondary | ICD-10-CM

## 2016-09-23 DIAGNOSIS — M47817 Spondylosis without myelopathy or radiculopathy, lumbosacral region: Secondary | ICD-10-CM | POA: Diagnosis not present

## 2016-09-23 DIAGNOSIS — N949 Unspecified condition associated with female genital organs and menstrual cycle: Secondary | ICD-10-CM

## 2016-09-23 DIAGNOSIS — G44229 Chronic tension-type headache, not intractable: Secondary | ICD-10-CM

## 2016-09-23 DIAGNOSIS — N9489 Other specified conditions associated with female genital organs and menstrual cycle: Secondary | ICD-10-CM

## 2016-09-23 DIAGNOSIS — M47812 Spondylosis without myelopathy or radiculopathy, cervical region: Secondary | ICD-10-CM

## 2016-09-23 LAB — URINALYSIS, COMPLETE
Bilirubin, UA: NEGATIVE
GLUCOSE, UA: NEGATIVE
Ketones, UA: NEGATIVE
LEUKOCYTES UA: NEGATIVE
Nitrite, UA: NEGATIVE
PROTEIN UA: NEGATIVE
RBC, UA: NEGATIVE
Specific Gravity, UA: 1.02 (ref 1.005–1.030)
Urobilinogen, Ur: 0.2 mg/dL (ref 0.2–1.0)
pH, UA: 7 (ref 5.0–7.5)

## 2016-09-23 LAB — MICROSCOPIC EXAMINATION
RBC, UA: NONE SEEN /hpf (ref 0–?)
Renal Epithel, UA: NONE SEEN /hpf

## 2016-09-23 MED ORDER — HYDROCODONE-ACETAMINOPHEN 7.5-325 MG PO TABS: 1.0000 | ORAL_TABLET | Freq: Two times a day (BID) | ORAL | 0 refills | Status: DC

## 2016-09-23 MED ORDER — FLUCONAZOLE 150 MG PO TABS: 150.0000 mg | ORAL_TABLET | Freq: Once | ORAL | 0 refills | Status: AC

## 2016-09-23 MED ORDER — BUTALBITAL-APAP-CAFFEINE 50-325-40 MG PO TABS: ORAL_TABLET | ORAL | 2 refills | Status: DC

## 2016-09-23 MED ORDER — DIAZEPAM 5 MG PO TABS: ORAL_TABLET | ORAL | 2 refills | Status: DC

## 2016-09-23 NOTE — Addendum Note (Signed)
Addended by: Cleda Daub on: 09/23/2016 11:20 AM   Modules accepted: Orders

## 2016-09-23 NOTE — Progress Notes (Signed)
   Subjective:    Patient ID: [name], female    DOB: 22-Mar-1956, 61 y.o.   MRN: [id number]  HPI  Patient comes in today for pain management. She has chronic neck and lower back pain. Pain assessment: Cause of pain- spondylosis on lumber and cervical spine Pain location- neck and lower back Pain on scale of 1-10- 5-8/10 Frequency- daily What increases pain-excessive activity What makes pain Better- rest and heating pad help Effects on ADL - still has to get things done despite pain Any change in general medical condition-none since last visit  Current medications- norco 7.5/325 bid Effectiveness of current meds-will bring down to 2/10 on pain scale Adverse reactions form pain meds-none Morphine equivalent 15.0  Pill count performed-No Urine drug screen- No Was the N[facility] reviewed- yes  If yes were their any concerning findings? - no descrepencey  * she also has a headache today that started last night and she had no Fioricet to take- needs refill- that is the only thing that will make her headache go away.  Review of Systems  Respiratory: Negative.   Cardiovascular: Negative.   Musculoskeletal: Positive for back pain and neck pain.  Neurological: Negative.   Psychiatric/Behavioral: Negative.   All other systems reviewed and are negative.      Objective:   Physical Exam  Constitutional: She is oriented to person, place, and time. She appears well-developed and well-nourished. No distress.  HENT:  Right Ear: External ear normal.  Left Ear: External ear normal.  Nose: Nose normal.  Mouth/Throat: Oropharynx is clear and moist.  Cardiovascular: Normal rate.   Pulmonary/Chest: Effort normal and breath sounds normal.  Neurological: She is alert and oriented to person, place, and time. She has normal reflexes. No cranial nerve deficit.  Skin: Skin is warm.  Psychiatric: She has a normal mood and affect. Her behavior is normal. Judgment and thought content normal.    BP 139/87   Pulse (!) 57   Temp 98.9 F (37.2 C) (Oral)   Ht 5\' 2"  (1.575 m)   Wt 146 lb (66.2 kg)   BMI 26.70 kg/m      Assessment & Plan:  1. Lumbosacral spondylosis without myelopathy Moist heat Back stretches encouraged - HYDROcodone-acetaminophen (NORCO) 7.5-325 MG tablet; Take 1 tablet by mouth 2 (two) times daily.  Dispense: 60 tablet; Refill: 0 - HYDROcodone-acetaminophen (NORCO) 7.5-325 MG tablet; Take 1 tablet by mouth 2 (two) times daily.  Dispense: 60 tablet; Refill: 0 - HYDROcodone-acetaminophen (NORCO) 7.5-325 MG tablet; Take 1 tablet by mouth 2 (two) times daily.  Dispense: 60 tablet; Refill: 0  2. Cervical spondylosis without myelopathy Moist heat - diazepam (VALIUM) 5 MG tablet; TAKE 1 TABLET EVERY 12 HOURS AS NEEDED FOR ANXIETY.  Dispense: 60 tablet; Refill: 2  3. Encounter for chronic pain management  4. Chronic tension-type headache, not intractable - butalbital-acetaminophen-caffeine (FIORICET, ESGIC) 50-325-40 MG tablet; TAKE 1 TABLET TWICE DAILY AS NEEDED FOR HEADACHE.  Dispense: 20 tablet; Refill: 2  [name] 07-29-1975, FNP

## 2016-09-23 NOTE — Patient Instructions (Signed)

## 2016-09-23 NOTE — Addendum Note (Signed)
Addended by: Bennie Pierini on: 09/23/2016 09:54 AM   Modules accepted: Orders

## 2016-10-02 ENCOUNTER — Telehealth: Payer: Self-pay | Admitting: Nurse Practitioner

## 2016-10-02 MED ORDER — BLOOD GLUCOSE MONITOR KIT
PACK | 0 refills | Status: DC
Start: 2016-10-02 — End: 2017-01-19

## 2016-10-02 NOTE — Telephone Encounter (Signed)
Script printed for pt to send into [employer] per pt request

## 2016-10-14 ENCOUNTER — Ambulatory Visit (INDEPENDENT_AMBULATORY_CARE_PROVIDER_SITE_OTHER): Payer: Medicare HMO | Admitting: Nurse Practitioner

## 2016-10-14 ENCOUNTER — Encounter: Payer: Self-pay | Admitting: Nurse Practitioner

## 2016-10-14 ENCOUNTER — Ambulatory Visit: Payer: Medicare Other | Admitting: Nurse Practitioner

## 2016-10-14 VITALS — BP 126/74 | HR 63 | Temp 98.0°F | Ht 62.0 in | Wt 145.0 lb

## 2016-10-14 DIAGNOSIS — Z01419 Encounter for gynecological examination (general) (routine) without abnormal findings: Secondary | ICD-10-CM

## 2016-10-14 DIAGNOSIS — E785 Hyperlipidemia, unspecified: Secondary | ICD-10-CM | POA: Diagnosis not present

## 2016-10-14 DIAGNOSIS — I10 Essential (primary) hypertension: Secondary | ICD-10-CM | POA: Diagnosis not present

## 2016-10-14 DIAGNOSIS — Z0000 Encounter for general adult medical examination without abnormal findings: Secondary | ICD-10-CM | POA: Diagnosis not present

## 2016-10-14 LAB — URINALYSIS, COMPLETE
Bilirubin, UA: NEGATIVE
Glucose, UA: NEGATIVE
Ketones, UA: NEGATIVE
LEUKOCYTES UA: NEGATIVE
Nitrite, UA: NEGATIVE
PH UA: 8.5 — AB (ref 5.0–7.5)
PROTEIN UA: NEGATIVE
RBC, UA: NEGATIVE
Specific Gravity, UA: 1.02 (ref 1.005–1.030)
Urobilinogen, Ur: 0.2 mg/dL (ref 0.2–1.0)

## 2016-10-14 LAB — MICROSCOPIC EXAMINATION
RBC, UA: NONE SEEN /hpf (ref 0–?)
RENAL EPITHEL UA: NONE SEEN /HPF
WBC, UA: NONE SEEN /hpf (ref 0–?)

## 2016-10-14 NOTE — Patient Instructions (Signed)

## 2016-10-14 NOTE — Progress Notes (Signed)
Subjective:    Patient ID: [name], female    DOB: May 14, 1955, 61 y.o.   MRN: [id number]  HPI Patient come sin today for annual physical, PAP and follow up of chronic medical problems. SHe has multiple medical problems and is on lots of meds. SHe has not been doing well. She says she hurts al the time. Is having more migraines. Says that it is due to the stress of living with her brother who is on drugs.   New complaint: She is c/o left flank pain- has had kidney stones in te past and wants to make sure she does not have one now.  Patient Active Problem List   Diagnosis Date Noted  . Encounter for chronic pain management 09/23/2016  . Pre-diabetes 09/02/2016  . Recurrent major depressive disorder, in partial remission ([place]) 07/14/2016  . Seizures ([place]) 07/14/2016  . GAD (generalized anxiety disorder) 07/14/2016  . Moderate episode of recurrent major depressive disorder ([place]) 07/14/2016  . Urinary urgency 07/14/2016  . Insomnia 09/05/2014  . Allergic rhinitis 09/05/2014  . Cervical spondylosis without myelopathy 12/19/2013    Class: Chronic  . Neural foraminal stenosis of cervical spine 12/19/2013  . Cervical radiculitis 09/19/2013  . Dysphagia 08/05/2013  . Lumbosacral spondylosis without myelopathy 11/16/2012  . Postlaminectomy syndrome, lumbar region 11/16/2012  . Chest pain, unspecified 09/30/2011  . Herpes simplex virus (HSV) infection 10/31/2008  . Hyperlipidemia with target LDL less than 100 10/31/2008  . Anxiety state 10/31/2008  . Essential hypertension 10/31/2008  . Asthma 10/31/2008  . GERD 10/31/2008  . SPINAL STENOSIS OF LUMBAR REGION 10/31/2008  . Myalgia and myositis 10/31/2008  . Osteoporosis 10/31/2008  . SPONDYLOLISTHESIS 10/31/2008  . SYNCOPE 10/31/2008  . Sleep apnea 10/31/2008   Outpatient Encounter Prescriptions as of 10/14/2016  Medication Sig  . ACCU-CHEK FASTCLIX LANCETS MISC Use to check blood sugar BID  . acyclovir ointment (ZOVIRAX) 5  % APPLY TO AFFECTED AREA EVERY 3 HOURS  . albuterol (PROVENTIL HFA;VENTOLIN HFA) 108 (90 Base) MCG/ACT inhaler Inhale 2 puffs into the lungs every 6 (six) hours as needed for wheezing or shortness of breath.  [name] albuterol (PROVENTIL) (2.5 MG/3ML) 0.083% nebulizer solution USE 1 VIAL IN NEBULIZER EVERY 4 HOURS AS NEEDED FOR WHEEZING.  [name] Alcohol Swabs (B-D SINGLE USE SWABS REGULAR) PADS Use to check BS twice daily  . amoxicillin (AMOXIL) 875 MG tablet Take 1 tablet (875 mg total) by mouth 2 (two) times daily. 1 po BID  . Blood Glucose Calibration (ACCU-CHEK SMARTVIEW CONTROL) LIQD Use as directed  . blood glucose meter kit and supplies KIT Dispense based on patient and insurance preference. Use up to four times daily as directed. (FOR ICD-9 250.00, 250.01).  [name] BREO ELLIPTA 200-25 MCG/INH AEPB INHALE 1 PUFF DAILY.  . busPIRone (BUSPAR) 10 MG tablet TAKE (1) TABLET TWICE DAILY.  . butalbital-acetaminophen-caffeine (FIORICET, ESGIC) 50-325-40 MG tablet TAKE 1 TABLET TWICE DAILY AS NEEDED FOR HEADACHE.  [name] dexlansoprazole (DEXILANT) 60 MG capsule Take 1 capsule (60 mg total) by mouth daily.  . diazepam (VALIUM) 5 MG tablet TAKE 1 TABLET EVERY 12 HOURS AS NEEDED FOR ANXIETY.  . DULoxetine (CYMBALTA) 60 MG capsule Take 1 capsule (60 mg total) by mouth at bedtime.  [name] escitalopram (LEXAPRO) 20 MG tablet TAKE (1) TABLET BY MOUTH ONCE DAILY.  [name] estradiol (ESTRACE VAGINAL) 0.1 MG/GM vaginal cream Place 1 Applicatorful vaginally at bedtime.  . fludrocortisone (FLORINEF) 0.1 MG tablet Take 2 tablets (0.2 mg total) by mouth daily.  [name]  furosemide (LASIX) 40 MG tablet TAKE (1) TABLET BY MOUTH ONCE DAILY.  [name] glucose blood (ACCU-CHEK SMARTVIEW) test strip Use as instructed  . glucose blood (ONETOUCH VERIO) test strip Check blood sugar daily-- dx E11.9  . HYDROcodone-acetaminophen (NORCO) 7.5-325 MG tablet Take 1 tablet by mouth 2 (two) times daily.  [name] HYDROcodone-acetaminophen (NORCO) 7.5-325 MG tablet Take 1 tablet by  mouth 2 (two) times daily.  [name] HYDROcodone-acetaminophen (NORCO) 7.5-325 MG tablet Take 1 tablet by mouth 2 (two) times daily.  [name] ipratropium (ATROVENT) 0.02 % nebulizer solution USE 1 VIAL IN NEBULIZER EVERY 4 HOURS AS NEEDED FOR WHEEZING.  [name] lamoTRIgine (LAMICTAL) 100 MG tablet TAKE (1) TABLET BY MOUTH ONCE DAILY.  [name] levETIRAcetam (KEPPRA) 500 MG tablet TAKE (1) TABLET TWICE DAILY.  [name] Loratadine 10 MG CAPS Take 1 capsule (10 mg total) by mouth daily.  . Menthol, Topical Analgesic, (BIOFREEZE ROLL-ON) 4 % GEL Apply topically.  . methocarbamol (ROBAXIN) 500 MG tablet Take 500 mg by mouth every 8 (eight) hours as needed for muscle spasms.  . miconazole (MICOTIN) 2 % cream Apply 1 application topically 2 (two) times daily.  . midodrine (PROAMATINE) 5 MG tablet TAKE (1/2) TABLET THREE TIMES DAILY.  . nitroGLYCERIN (NITROSTAT) 0.4 MG SL tablet PLACE ONE (1) TABLET UNDER TONGUE EVERY 5 MINUTES UP TO (3) DOSES AS NEEDED FOR CHEST PAIN.  [name] ONETOUCH DELICA LANCETS 01U MISC 1 daily to keep diary of blood sugars-- Dx E11.9  . oxybutynin (DITROPAN) 5 MG tablet Take 1 tablet (5 mg total) by mouth 2 (two) times daily.  . ranitidine (ZANTAC) 150 MG tablet Take 1 tablet (150 mg total) by mouth at bedtime.  . rosuvastatin (CRESTOR) 10 MG tablet Take 1 tablet (10 mg total) by mouth daily.  [name] tolterodine (DETROL) 2 MG tablet TAKE (1) TABLET TWICE DAILY.  . traZODone (DESYREL) 150 MG tablet TAKE (1) TABLET DAILY AT BEDTIME.  . valACYclovir (VALTREX) 500 MG tablet TAKE (1) TABLET TWICE DAILY.   No facility-administered encounter medications on file as of 10/14/2016.        Review of Systems  Constitutional: Negative for activity change and appetite change.  HENT: Negative.   Eyes: Negative for pain.  Respiratory: Negative for shortness of breath.   Cardiovascular: Negative for chest pain, palpitations and leg swelling.  Gastrointestinal: Negative for abdominal pain.  Endocrine: Negative for polydipsia.    Genitourinary: Negative.   Musculoskeletal: Positive for arthralgias, back pain and neck pain.  Skin: Negative for rash.  Neurological: Positive for headaches. Negative for dizziness and weakness.  Hematological: Does not bruise/bleed easily.  Psychiatric/Behavioral: Positive for dysphoric mood.  All other systems reviewed and are negative.      Objective:   Physical Exam  Constitutional: She is oriented to person, place, and time. She appears well-developed and well-nourished.  HENT:  Head: Normocephalic.  Right Ear: Hearing, tympanic membrane, external ear and ear canal normal.  Left Ear: Hearing, tympanic membrane, external ear and ear canal normal.  Nose: Nose normal.  Mouth/Throat: Uvula is midline and oropharynx is clear and moist.  Eyes: Pupils are equal, round, and reactive to light. Conjunctivae and EOM are normal.  Neck: Normal range of motion and full passive range of motion without pain. Neck supple. No JVD present. Carotid bruit is not present. No thyroid mass and no thyromegaly present.  Cardiovascular: Normal rate, normal heart sounds and intact distal pulses.   No murmur heard. Pulmonary/Chest: Effort normal and breath sounds normal. Right breast exhibits  tenderness. Right breast exhibits no inverted nipple, no mass, no nipple discharge and no skin change. Left breast exhibits no inverted nipple, no mass, no nipple discharge, no skin change and no tenderness.  Abdominal: Soft. Bowel sounds are normal. She exhibits no mass. There is no tenderness.  Genitourinary: Vagina normal and uterus normal. No breast swelling, tenderness, discharge or bleeding.  Genitourinary Comments: No CVA tenderness bimanual exam-No adnexal masses or tenderness. Cervix parous and pink  Musculoskeletal: Normal range of motion.  Lymphadenopathy:    She has no cervical adenopathy.  Neurological: She is alert and oriented to person, place, and time.  Skin: Skin is warm and dry.  Psychiatric:  She has a normal mood and affect. Her behavior is normal. Judgment and thought content normal.   BP 126/74   Pulse 63   Temp 98 F (36.7 C) (Oral)           Assessment & Plan:   1. Annual physical exam   2. Gynecologic exam normal    Labs pending Continue all meds Follow up in 3 months Watch diet Stress management encouraged Follow up in 3 months  Orders Placed This Encounter  Procedures  . Urinalysis, Complete  . CMP14+EGFR  . Lipid panel  . CBC with Differential/Platelet  . Thyroid Panel With TSH    [name] Hassell Done, FNP

## 2016-10-15 LAB — LIPID PANEL
CHOL/HDL RATIO: 3 ratio (ref 0.0–4.4)
CHOLESTEROL TOTAL: 195 mg/dL (ref 100–199)
HDL: 64 mg/dL (ref >39)
LDL CALC: 97 mg/dL (ref 0–99)
TRIGLYCERIDES: 169 mg/dL — AB (ref 0–149)
VLDL Cholesterol Cal: 34 mg/dL (ref 5–40)

## 2016-10-15 LAB — CBC WITH DIFFERENTIAL/PLATELET

## 2016-10-15 LAB — THYROID PANEL WITH TSH
Free Thyroxine Index: 1.3 (ref 1.2–4.9)
T3 UPTAKE RATIO: 19 % — AB (ref 24–39)
T4 TOTAL: 6.8 ug/dL (ref 4.5–12.0)
TSH: 1.96 u[IU]/mL (ref 0.450–4.500)

## 2016-10-15 LAB — CMP14+EGFR
A/G RATIO: 1.5 (ref 1.2–2.2)
ALBUMIN: 4.7 g/dL (ref 3.5–5.5)
ALK PHOS: 123 IU/L — AB (ref 39–117)
ALT: 25 IU/L (ref 0–32)
AST: 26 IU/L (ref 0–40)
BUN / CREAT RATIO: 13 (ref 9–23)
BUN: 11 mg/dL (ref 6–24)
Bilirubin Total: 0.2 mg/dL (ref 0.0–1.2)
CO2: 22 mmol/L (ref 20–29)
CREATININE: 0.83 mg/dL (ref 0.57–1.00)
Calcium: 10.2 mg/dL (ref 8.7–10.2)
Chloride: 101 mmol/L (ref 96–106)
GFR calc Af Amer: 89 mL/min/{1.73_m2} (ref >59)
GFR calc non Af Amer: 77 mL/min/{1.73_m2} (ref >59)
Globulin, Total: 3.2 g/dL (ref 1.5–4.5)
Glucose: 84 mg/dL (ref 65–99)
POTASSIUM: 4.7 mmol/L (ref 3.5–5.2)
SODIUM: 142 mmol/L (ref 134–144)
Total Protein: 7.9 g/dL (ref 6.0–8.5)

## 2016-10-20 LAB — IGP, APTIMA HPV, RFX 16/18,45
HPV Aptima: POSITIVE — AB
HPV Genotype 16: NEGATIVE
HPV Genotype 18,45: NEGATIVE
PAP SMEAR COMMENT: 0

## 2016-10-23 ENCOUNTER — Other Ambulatory Visit: Payer: Self-pay

## 2016-10-30 ENCOUNTER — Other Ambulatory Visit: Payer: Self-pay | Admitting: Nurse Practitioner

## 2016-10-30 DIAGNOSIS — F411 Generalized anxiety disorder: Secondary | ICD-10-CM

## 2016-11-10 ENCOUNTER — Telehealth: Payer: Self-pay | Admitting: Nurse Practitioner

## 2016-11-10 NOTE — Telephone Encounter (Signed)
Please advise 

## 2016-11-11 NOTE — Telephone Encounter (Signed)
No I do not do hip injections

## 2016-11-13 ENCOUNTER — Ambulatory Visit (INDEPENDENT_AMBULATORY_CARE_PROVIDER_SITE_OTHER): Payer: Medicare HMO | Admitting: Pediatrics

## 2016-11-13 ENCOUNTER — Telehealth: Payer: Self-pay | Admitting: *Deleted

## 2016-11-13 ENCOUNTER — Encounter: Payer: Self-pay | Admitting: Pediatrics

## 2016-11-13 VITALS — BP 113/68 | HR 70 | Temp 99.1°F | Ht 62.0 in | Wt 150.8 lb

## 2016-11-13 DIAGNOSIS — M48061 Spinal stenosis, lumbar region without neurogenic claudication: Secondary | ICD-10-CM

## 2016-11-13 DIAGNOSIS — M7062 Trochanteric bursitis, left hip: Secondary | ICD-10-CM | POA: Diagnosis not present

## 2016-11-13 DIAGNOSIS — M7061 Trochanteric bursitis, right hip: Secondary | ICD-10-CM | POA: Diagnosis not present

## 2016-11-13 DIAGNOSIS — M47817 Spondylosis without myelopathy or radiculopathy, lumbosacral region: Secondary | ICD-10-CM

## 2016-11-13 MED ORDER — HUGO ROLLING WALKER BASIC MISC: 1.0000 | Freq: Once | 0 refills | Status: AC

## 2016-11-13 NOTE — Telephone Encounter (Signed)
Pt carried RX to [facility] for walker Pt requesting rolling walker with seat If okayed, please print RX and fax to [phone] Please also include DX

## 2016-11-13 NOTE — Progress Notes (Signed)
  Subjective:   Patient ID: [name], female    DOB: 1956/01/27, 61 y.o.   MRN: [id number] CC: Hip Pain (Bilateral) and Knee Pain (Right)  HPI: [name] is a 61 y.o. female presenting for Hip Pain (Bilateral) and Knee Pain (Right)  Doesn't remember the last time she got injections for her hip bursitis, has been over a year she thinks Has helped in the past Wants to get them again today having worsening pain in both sides of her hips No change in activity Says they "seized up" the last few days, has felt very tight Rolling walker she has borrowed from friend has enabled her to move around, pain with walking and feels very unsteady on her feet No groin pain Her pain is primarily on sides of hips, no groin pain No fevers Otherwise has bene feeling  Relevant past medical, surgical, family and social history reviewed. Allergies and medications reviewed and updated. History  Smoking Status  . Never Smoker  Smokeless Tobacco  . Never Used   ROS: Per HPI   Objective:    BP 113/68   Pulse 70   Temp 99.1 F (37.3 C) (Oral)   Ht 5\' 2"  (1.575 m)   Wt 150 lb 12.8 oz (68.4 kg)   BMI 27.58 kg/m   Wt Readings from Last 3 Encounters:  11/13/16 150 lb 12.8 oz (68.4 kg)  10/14/16 145 lb (65.8 kg)  09/23/16 146 lb (66.2 kg)   Gen: NAD, alert, cooperative with exam, NCAT EYES: EOMI, no conjunctival injection, or no icterus CV: NRRR, normal S1/S2, no murmur, distal pulses 2+ b/l Resp: CTABL, no wheezes, normal WOB Ext: No edema, warm Neuro: Alert and oriented MSK: ttp over both L and R greater trochanter bursas No groin pain with int/ext rotation of hips b/l   Assessment & Plan:  [place] was seen today for hip pain and knee pain.  Diagnoses and all orders for this visit:  Trochanteric bursitis of both hips Discussed options, pt wants to proceed with bursa injections Gave gentle stretches/exercises -     Walker rolling   Trochanteric bursa injection Verbal  consent was obtained from the patient. Risks including infection, bleeding explained and contrasted with benefits and alternatives. Patient prepped with betadine then L trochanteric bursa was injected with 63mL bupivocaine, 40mg  kenalog. Procedure repeated R hip. The patient tolerated the procedure well. No complications.  Injection: 45mL bupivacaine and  Kenalog 40 mg. Needle: 25 gauge   Follow up plan: Return in about 4 weeks (around 12/11/2016) for follow up. With PCP. 3m, MD 12/13/2016 [facility]

## 2016-11-13 NOTE — Patient Instructions (Addendum)

## 2016-11-13 NOTE — Telephone Encounter (Signed)
RX faxed to [facility]

## 2016-11-15 MED ORDER — TRIAMCINOLONE ACETONIDE 40 MG/ML IJ SUSP: 40.0000 mg | Freq: Once | INTRAMUSCULAR | Status: DC

## 2016-11-20 ENCOUNTER — Encounter: Payer: Self-pay | Admitting: Nurse Practitioner

## 2016-11-20 ENCOUNTER — Ambulatory Visit (INDEPENDENT_AMBULATORY_CARE_PROVIDER_SITE_OTHER): Payer: Medicare HMO | Admitting: Nurse Practitioner

## 2016-11-20 VITALS — BP 123/79 | HR 68 | Temp 98.0°F | Ht 62.0 in | Wt 140.0 lb

## 2016-11-20 DIAGNOSIS — M7062 Trochanteric bursitis, left hip: Secondary | ICD-10-CM | POA: Diagnosis not present

## 2016-11-20 DIAGNOSIS — M25561 Pain in right knee: Secondary | ICD-10-CM

## 2016-11-20 DIAGNOSIS — M4807 Spinal stenosis, lumbosacral region: Secondary | ICD-10-CM | POA: Diagnosis not present

## 2016-11-20 DIAGNOSIS — M7061 Trochanteric bursitis, right hip: Secondary | ICD-10-CM | POA: Diagnosis not present

## 2016-11-20 DIAGNOSIS — G8929 Other chronic pain: Secondary | ICD-10-CM

## 2016-11-20 DIAGNOSIS — M47897 Other spondylosis, lumbosacral region: Secondary | ICD-10-CM | POA: Diagnosis not present

## 2016-11-20 MED ORDER — BUPIVACAINE HCL 0.25 % IJ SOLN
10.0000 mL | Freq: Once | INTRAMUSCULAR | Status: AC
  Administered 2016-11-20: 10 mL

## 2016-11-20 MED ORDER — TRIAMCINOLONE ACETONIDE 40 MG/ML IJ SUSP
40.0000 mg | Freq: Once | INTRAMUSCULAR | Status: AC
  Administered 2016-11-13: 40 mg via INTRAMUSCULAR

## 2016-11-20 MED ORDER — METHYLPREDNISOLONE ACETATE 40 MG/ML IJ SUSP: 40.0000 mg | Freq: Once | INTRAMUSCULAR | Status: DC

## 2016-11-20 MED ORDER — BUPIVACAINE HCL 0.25 % IJ SOLN
1.0000 mL | Freq: Once | INTRAMUSCULAR | Status: AC
  Administered 2016-11-20: 1 mL via INTRA_ARTICULAR

## 2016-11-20 MED ORDER — METHYLPREDNISOLONE ACETATE 40 MG/ML IJ SUSP
40.0000 mg | Freq: Once | INTRAMUSCULAR | Status: AC
  Administered 2016-11-20: 40 mg via INTRAMUSCULAR

## 2016-11-20 NOTE — Progress Notes (Signed)
   Subjective:    Patient ID: [name], female    DOB: [birth year], 61 y.o.   MRN: [id number]  HPI  Patient comes in today c/oi right knee pain. She has had it injected in the past and it helped for greater then a year. Has been hurting and swelling for about 8 weeks now and swells intermittently.   Review of Systems  Constitutional: Negative.   Respiratory: Negative.   Cardiovascular: Negative.   Genitourinary: Negative.   Neurological: Negative.   Psychiatric/Behavioral: Negative.   All other systems reviewed and are negative.      Objective:   Physical Exam  Constitutional: She appears well-developed and well-nourished.  HENT:  Right Ear: External ear normal.  Left Ear: External ear normal.  Nose: Nose normal.  Mouth/Throat: Oropharynx is clear and moist.  Cardiovascular: Normal rate and regular rhythm.   Pulmonary/Chest: Effort normal and breath sounds normal.  Musculoskeletal:  Right knee crepitus with full flexion and extension Mild effusion No patella tenderness All ligaments intact  Neurological: She is alert.  Skin: Skin is warm.  Psychiatric: She has a normal mood and affect. Her behavior is normal. Judgment and thought content normal.   BP 123/79   Pulse 68   Temp 98 F (36.7 C) (Oral)   Ht 5\' 2"  (1.575 m)   Wt 140 lb (63.5 kg)   BMI 25.61 kg/m   Right knee injection under sterile technique      Assessment & Plan:  1. Chronic pain of right knee Ice BID Elevate when sitting RTO prn - methylPREDNISolone acetate (DEPO-MEDROL) injection 40 mg; Inject 1 mL (40 mg total) into the muscle once. - bupivacaine (MARCAINE) 0.25 % (with pres) injection 1 mL; Inject 1 mL into the articular space once.   [name] , FNP

## 2016-11-20 NOTE — Addendum Note (Signed)
Addended by: Caryl Bis on: 11/20/2016 08:44 AM   Modules accepted: Orders

## 2016-11-20 NOTE — Patient Instructions (Signed)
Knee Injection, Care After  Refer to this sheet in the next few weeks. These instructions provide you with information about caring for yourself after your procedure. Your health care provider may also give you more specific instructions. Your treatment has been planned according to current medical practices, but problems sometimes occur. Call your health care provider if you have any problems or questions after your procedure.  What can I expect after the procedure?  After the procedure, it is common to have:   Soreness.   Warmth.   Swelling.    You may have more pain, swelling, and warmth than you did before the injection. This reaction may last for about one day.  Follow these instructions at home:  Bathing   If you were given a bandage (dressing), keep it dry until your health care provider says it can be removed. Ask your health care provider when you can start showering or taking a bath.  Managing pain, stiffness, and swelling   If directed, apply ice to the injection area:  ? Put ice in a plastic bag.  ? Place a towel between your skin and the bag.  ? Leave the ice on for 20 minutes, 2-3 times per day.   Do not apply heat to your knee.   Raise the injection area above the level of your heart while you are sitting or lying down.  Activity   Avoid strenuous activities for as long as directed by your health care provider. Ask your health care provider when you can return to your normal activities.  General instructions   Take medicines only as directed by your health care provider.   Do not take aspirin or other over-the-counter medicines unless your health care provider says you can.   Check your injection site every day for signs of infection. Watch for:  ? Redness, swelling, or pain.  ? Fluid, blood, or pus.   Follow your health care provider's instructions about dressing changes and removal.  Contact a health care provider if:   You have symptoms at your injection site that last longer than  two days after your procedure.   You have redness, swelling, or pain in your injection area.   You have fluid, blood, or pus coming from your injection site.   You have warmth in your injection area.   You have a fever.   Your pain is not controlled with medicine.  Get help right away if:   Your knee turns very red.   Your knee becomes very swollen.   Your knee pain is severe.  This information is not intended to replace advice given to you by your health care provider. Make sure you discuss any questions you have with your health care provider.  Document Released: 04/07/2014 Document Revised: 11/21/2015 Document Reviewed: 01/25/2014  Elsevier Interactive Patient Education  2018 [employer]

## 2016-11-21 ENCOUNTER — Other Ambulatory Visit: Payer: Self-pay | Admitting: Nurse Practitioner

## 2016-11-21 MED ORDER — LAMOTRIGINE 150 MG PO TABS: 150.0000 mg | ORAL_TABLET | Freq: Every day | ORAL | 5 refills | Status: DC

## 2016-11-24 NOTE — Telephone Encounter (Signed)
Patient seen 08/16

## 2016-11-29 ENCOUNTER — Other Ambulatory Visit: Payer: Self-pay | Admitting: Nurse Practitioner

## 2016-12-04 ENCOUNTER — Other Ambulatory Visit: Payer: Self-pay | Admitting: *Deleted

## 2016-12-04 MED ORDER — LORATADINE 10 MG PO CAPS: 10.0000 mg | ORAL_CAPSULE | Freq: Every day | ORAL | 3 refills | Status: DC

## 2016-12-04 NOTE — Telephone Encounter (Signed)
Refill request for Loratidine Okayed per MMM

## 2016-12-12 ENCOUNTER — Emergency Department (HOSPITAL_COMMUNITY)
Admission: EM | Admit: 2016-12-12 | Discharge: 2016-12-12 | Disposition: A | Discharge status: Home / Self Care | Payer: Medicare HMO | Attending: Emergency Medicine | Admitting: Emergency Medicine

## 2016-12-12 ENCOUNTER — Emergency Department (HOSPITAL_COMMUNITY): Payer: Medicare HMO

## 2016-12-12 ENCOUNTER — Encounter (HOSPITAL_COMMUNITY): Payer: Self-pay | Admitting: Emergency Medicine

## 2016-12-12 DIAGNOSIS — I2510 Atherosclerotic heart disease of native coronary artery without angina pectoris: Secondary | ICD-10-CM | POA: Insufficient documentation

## 2016-12-12 DIAGNOSIS — K5730 Diverticulosis of large intestine without perforation or abscess without bleeding: Secondary | ICD-10-CM | POA: Diagnosis not present

## 2016-12-12 DIAGNOSIS — J45909 Unspecified asthma, uncomplicated: Secondary | ICD-10-CM | POA: Insufficient documentation

## 2016-12-12 DIAGNOSIS — G8929 Other chronic pain: Secondary | ICD-10-CM | POA: Insufficient documentation

## 2016-12-12 DIAGNOSIS — Z79899 Other long term (current) drug therapy: Secondary | ICD-10-CM | POA: Insufficient documentation

## 2016-12-12 DIAGNOSIS — J449 Chronic obstructive pulmonary disease, unspecified: Secondary | ICD-10-CM | POA: Insufficient documentation

## 2016-12-12 DIAGNOSIS — R1031 Right lower quadrant pain: Secondary | ICD-10-CM | POA: Insufficient documentation

## 2016-12-12 DIAGNOSIS — I10 Essential (primary) hypertension: Secondary | ICD-10-CM | POA: Diagnosis not present

## 2016-12-12 DIAGNOSIS — Z79891 Long term (current) use of opiate analgesic: Secondary | ICD-10-CM | POA: Diagnosis not present

## 2016-12-12 LAB — COMPREHENSIVE METABOLIC PANEL
ALBUMIN: 4.1 g/dL (ref 3.5–5.0)
ALT: 31 U/L (ref 14–54)
ANION GAP: 9 (ref 5–15)
AST: 23 U/L (ref 15–41)
Alkaline Phosphatase: 99 U/L (ref 38–126)
BILIRUBIN TOTAL: 0.1 mg/dL — AB (ref 0.3–1.2)
BUN: 14 mg/dL (ref 6–20)
CO2: 26 mmol/L (ref 22–32)
Calcium: 9.7 mg/dL (ref 8.9–10.3)
Chloride: 105 mmol/L (ref 101–111)
Creatinine, Ser: 0.84 mg/dL (ref 0.44–1.00)
GFR calc Af Amer: >60 mL/min (ref >60)
GFR calc non Af Amer: >60 mL/min (ref >60)
GLUCOSE: 104 mg/dL — AB (ref 65–99)
POTASSIUM: 4.6 mmol/L (ref 3.5–5.1)
SODIUM: 140 mmol/L (ref 135–145)
TOTAL PROTEIN: 7.7 g/dL (ref 6.5–8.1)

## 2016-12-12 LAB — CBC
HCT: 43.4 % (ref 36.0–46.0)
Hemoglobin: 13.7 g/dL (ref 12.0–15.0)
MCH: 30.1 pg (ref 26.0–34.0)
MCHC: 31.6 g/dL (ref 30.0–36.0)
MCV: 95.4 fL (ref 78.0–100.0)
PLATELETS: 181 10*3/uL (ref 150–400)
RBC: 4.55 MIL/uL (ref 3.87–5.11)
RDW: 14.7 % (ref 11.5–15.5)
WBC: 6.1 10*3/uL (ref 4.0–10.5)

## 2016-12-12 LAB — URINALYSIS, ROUTINE W REFLEX MICROSCOPIC
BILIRUBIN URINE: NEGATIVE
Glucose, UA: NEGATIVE mg/dL
Hgb urine dipstick: NEGATIVE
KETONES UR: NEGATIVE mg/dL
Leukocytes, UA: NEGATIVE
NITRITE: NEGATIVE
Protein, ur: NEGATIVE mg/dL
Specific Gravity, Urine: 1.014 (ref 1.005–1.030)
pH: 8 (ref 5.0–8.0)

## 2016-12-12 LAB — LIPASE, BLOOD: Lipase: 27 U/L (ref 11–51)

## 2016-12-12 MED ORDER — ONDANSETRON HCL 4 MG/2ML IJ SOLN
4.0000 mg | Freq: Once | INTRAMUSCULAR | Status: AC
  Administered 2016-12-12: 4 mg via INTRAVENOUS
  Filled 2016-12-12: qty 2

## 2016-12-12 MED ORDER — SODIUM CHLORIDE 0.9 % IV BOLUS (SEPSIS)
500.0000 mL | Freq: Once | INTRAVENOUS | Status: AC
  Administered 2016-12-12: 500 mL via INTRAVENOUS

## 2016-12-12 MED ORDER — HYDROMORPHONE HCL 4 MG PO TABS: 4.0000 mg | ORAL_TABLET | Freq: Four times a day (QID) | ORAL | 0 refills | Status: DC | PRN

## 2016-12-12 MED ORDER — IOPAMIDOL (ISOVUE-300) INJECTION 61%
100.0000 mL | Freq: Once | INTRAVENOUS | Status: AC | PRN
  Administered 2016-12-12: 100 mL via INTRAVENOUS

## 2016-12-12 MED ORDER — HYDROMORPHONE HCL 1 MG/ML IJ SOLN
1.0000 mg | Freq: Once | INTRAMUSCULAR | Status: AC
  Administered 2016-12-12: 1 mg via INTRAVENOUS
  Filled 2016-12-12: qty 1

## 2016-12-12 MED ORDER — SODIUM CHLORIDE 0.9 % IV SOLN
INTRAVENOUS | Status: DC
  Administered 2016-12-12: 20:00:00 via INTRAVENOUS

## 2016-12-12 NOTE — ED Provider Notes (Signed)
[facility] DEPT Provider Note   CSN: [id number] Arrival date & time: 12/12/16  1509     History   Chief Complaint Chief Complaint  Patient presents with  . Abdominal Pain    HPI [name] is a 61 y.o. female.  Patient with complaint of right lower quadrant abdominal pain for 2 days. Been constant in nature. Does radiate into the right flank area. Not associated with nausea vomiting or diarrhea. No dysuria no fevers.      Past Medical History:  Diagnosis Date  . Anginal pain ([place])    last time   . Anxiety   . Arthritis    RHEUMATOID  . Asthma   . Bipolar 1 disorder ([place])   . COPD (chronic obstructive pulmonary disease) ([place])   . Coronary artery disease    reported hx of "MI";  Echo 2009 with normal LVF;  Myoview 05/2011: no ischemia  . Depression   . Dyslipidemia   . Esophageal stricture   . Fibromyalgia   . GERD (gastroesophageal reflux disease)   . H/O hiatal hernia   . Head injury, unspecified   . Herpes simplex infection   . History of kidney stones   . Hyperlipidemia   . Hypertension   . Insomnia   . Myocardial infarction ([place])   . Osteoporosis   . Pneumonia    hx  . Seizures ([place])    last 3 weeks ago- prescribed keppra -never took didnt like side effects read about online  . Shortness of breath   . Sleep apnea    ? neg  . Spinal stenosis of lumbar region   . Spondylolisthesis   . Status post placement of implantable loop recorder   . Supraventricular tachycardia ([place])   . Syncope and collapse    s/p ILR; no arhythmogenic cause identified  . UTI (lower urinary tract infection)     Patient Active Problem List   Diagnosis Date Noted  . Encounter for chronic pain management 09/23/2016  . Pre-diabetes 09/02/2016  . Recurrent major depressive disorder, in partial remission ([place]) 07/14/2016  . Seizures ([place]) 07/14/2016  . GAD (generalized anxiety disorder) 07/14/2016  . Moderate episode of recurrent major depressive disorder ([place])  07/14/2016  . Urinary urgency 07/14/2016  . Insomnia 09/05/2014  . Allergic rhinitis 09/05/2014  . Cervical spondylosis without myelopathy 12/19/2013    Class: Chronic  . Neural foraminal stenosis of cervical spine 12/19/2013  . Cervical radiculitis 09/19/2013  . Dysphagia 08/05/2013  . Lumbosacral spondylosis without myelopathy 11/16/2012  . Postlaminectomy syndrome, lumbar region 11/16/2012  . Chest pain, unspecified 09/30/2011  . Herpes simplex virus (HSV) infection 10/31/2008  . Hyperlipidemia with target LDL less than 100 10/31/2008  . Anxiety state 10/31/2008  . Essential hypertension 10/31/2008  . Asthma 10/31/2008  . GERD 10/31/2008  . SPINAL STENOSIS OF LUMBAR REGION 10/31/2008  . Myalgia and myositis 10/31/2008  . Osteoporosis 10/31/2008  . SPONDYLOLISTHESIS 10/31/2008  . SYNCOPE 10/31/2008  . Sleep apnea 10/31/2008    Past Surgical History:  Procedure Laterality Date  . BACK SURGERY    . BREAST SURGERY     lumpectomy  . CHOLECYSTECTOMY    . CYSTOSCOPY     stone  . DOPPLER ECHOCARDIOGRAPHY  2009  . head up tilt table testing  06/15/2007   [name]  . HEMORRHOID SURGERY    . insertion of implatable loop recorder  08/11/2007   [name]  . POSTERIOR CERVICAL FUSION/FORAMINOTOMY N/A 12/19/2013   Procedure: RIGHT C3-4.C4-5  AND C5-6 FORAMINOTOMIES;  Surgeon: [name], MD;  Location: [facility];  Service: Orthopedics;  Laterality: N/A;  . TUBAL LIGATION      OB History    No data available       Home Medications    Prior to Admission medications   Medication Sig Start Date End Date Taking? Authorizing Provider  ACCU-CHEK FASTCLIX LANCETS MISC Use to check blood sugar BID 09/17/16  Yes Martin, [name], FNP  acyclovir ointment (ZOVIRAX) 5 % APPLY TO AFFECTED AREA EVERY 3 HOURS 07/01/16  Yes Hassell Done, [name], FNP  albuterol (PROVENTIL HFA;VENTOLIN HFA) 108 (90 Base) MCG/ACT inhaler Inhale 2 puffs into the lungs every 6 (six) hours as needed for  wheezing or shortness of breath.   Yes [provider]  albuterol (PROVENTIL) (2.5 MG/3ML) 0.083% nebulizer solution USE 1 VIAL IN NEBULIZER EVERY 4 HOURS AS NEEDED FOR WHEEZING. 09/02/16  Yes Hassell Done, [name], FNP  Alcohol Swabs (B-D SINGLE USE SWABS REGULAR) PADS Use to check BS twice daily 09/17/16  Yes Hassell Done, [name], FNP  Blood Glucose Calibration (ACCU-CHEK SMARTVIEW CONTROL) LIQD Use as directed 09/17/16  Yes Hassell Done, [name], FNP  blood glucose meter kit and supplies KIT Dispense based on patient and insurance preference. Use up to four times daily as directed. (FOR ICD-9 250.00, 250.01). 10/02/16  Yes Martin, [name], FNP  BREO ELLIPTA 200-25 MCG/INH AEPB INHALE 1 PUFF DAILY. 05/05/16  Yes Martin, [name], FNP  busPIRone (BUSPAR) 10 MG tablet TAKE (1) TABLET TWICE DAILY. 10/31/16  Yes Martin, [name], FNP  butalbital-acetaminophen-caffeine (FIORICET, ESGIC) [phone] MG tablet TAKE 1 TABLET TWICE DAILY AS NEEDED FOR HEADACHE. 09/23/16  Yes Hassell Done, [name], FNP  dexlansoprazole (DEXILANT) 60 MG capsule Take 1 capsule (60 mg total) by mouth daily. 07/14/16  Yes Martin, [name], FNP  diazepam (VALIUM) 5 MG tablet TAKE 1 TABLET EVERY 12 HOURS AS NEEDED FOR ANXIETY. 09/23/16  Yes Hassell Done, [name], FNP  DULoxetine (CYMBALTA) 60 MG capsule Take 1 capsule (60 mg total) by mouth at bedtime. 07/14/16  Yes Martin, [name], FNP  escitalopram (LEXAPRO) 20 MG tablet TAKE (1) TABLET BY MOUTH ONCE DAILY. 07/14/16  Yes Martin, [name], FNP  estradiol (ESTRACE VAGINAL) 0.1 MG/GM vaginal cream Place 1 Applicatorful vaginally at bedtime. 02/12/16  Yes Martin, [name], FNP  fludrocortisone (FLORINEF) 0.1 MG tablet Take 2 tablets (0.2 mg total) by mouth daily. 09/17/16  Yes Martin, [name], FNP  furosemide (LASIX) 40 MG tablet TAKE (1) TABLET BY MOUTH ONCE DAILY. 09/17/16  Yes Hassell Done, [name], FNP  glucose blood (ACCU-CHEK SMARTVIEW) test  strip Use as instructed 09/17/16  Yes Hassell Done, [name], FNP  glucose blood [facility] VERIO) test strip Check blood sugar daily-- dx E11.9 07/28/16  Yes Hassell Done, [name], FNP  HYDROcodone-acetaminophen (NORCO) 7.5-325 MG tablet Take 1 tablet by mouth 2 (two) times daily. 09/23/16  Yes Martin, [name], FNP  HYDROcodone-acetaminophen (NORCO) 7.5-325 MG tablet Take 1 tablet by mouth 2 (two) times daily. 09/23/16  Yes Martin, [name], FNP  ipratropium (ATROVENT) 0.02 % nebulizer solution USE 1 VIAL IN NEBULIZER EVERY 4 HOURS AS NEEDED FOR WHEEZING. 12/02/16  Yes Hassell Done, [name], FNP  lamoTRIgine (LAMICTAL) 150 MG tablet Take 1 tablet (150 mg total) by mouth daily. 11/21/16  Yes Martin, [name], FNP  levETIRAcetam (KEPPRA) 500 MG tablet TAKE (1) TABLET TWICE DAILY. 07/14/16  Yes Martin, [name], FNP  Loratadine 10 MG CAPS Take 1 capsule (10 mg total) by mouth daily. 12/04/16  Yes Martin, [name], FNP  Menthol, Topical Analgesic, (BIOFREEZE ROLL-ON) 4 % GEL Apply  topically.   Yes [provider]  methocarbamol (ROBAXIN) 500 MG tablet Take 500 mg by mouth every 8 (eight) hours as needed for muscle spasms.   Yes [provider]  miconazole (MICOTIN) 2 % cream Apply 1 application topically 2 (two) times daily. 06/10/16  Yes Stacks, Cletus Gash, MD  midodrine (PROAMATINE) 5 MG tablet TAKE (1/2) TABLET THREE TIMES DAILY. 11/17/14  Yes Branch, Alphonse Guild, MD  nitroGLYCERIN (NITROSTAT) 0.4 MG SL tablet PLACE ONE (1) TABLET UNDER TONGUE EVERY 5 MINUTES UP TO (3) DOSES AS NEEDED FOR CHEST PAIN. 10/31/16  Yes Hassell Done, [name], FNP  [facility] DELICA LANCETS 23R MISC 1 daily to keep diary of blood sugars-- Dx E11.9 05/19/16  Yes Hassell Done, [name], FNP  oxybutynin (DITROPAN) 5 MG tablet TAKE (1) TABLET TWICE DAILY. 12/02/16  Yes Martin, [name], FNP  ranitidine (ZANTAC) 150 MG tablet Take 1 tablet (150 mg total) by mouth at bedtime. 07/14/16  Yes Martin,  [name], FNP  rosuvastatin (CRESTOR) 10 MG tablet Take 1 tablet (10 mg total) by mouth daily. 07/14/16  Yes Hassell Done, [name], FNP  tolterodine (DETROL) 2 MG tablet TAKE (1) TABLET TWICE DAILY. 07/14/16  Yes Hassell Done, [name], FNP  traZODone (DESYREL) 150 MG tablet TAKE (1) TABLET DAILY AT BEDTIME. 07/14/16  Yes Hassell Done, [name], FNP  valACYclovir (VALTREX) 500 MG tablet TAKE (1) TABLET TWICE DAILY. 07/14/16  Yes Hassell Done, [name], FNP  HYDROmorphone (DILAUDID) 4 MG tablet Take 1 tablet (4 mg total) by mouth every 6 (six) hours as needed for severe pain. 12/12/16   Fredia Sorrow, MD    Family History Family History  Problem Relation Age of Onset  . Heart attack Father   . Cancer Father   . Mental illness Father   . Cancer Mother   . Mental illness Mother   . Heart attack Brother        stents  . Alcohol abuse Brother   . Heart disease Brother   . Drug abuse Brother   . Diabetes Brother   . Colon cancer Maternal Aunt   . Cirrhosis Brother   . Stomach cancer Neg Hx     Social History Social History  Substance Use Topics  . Smoking status: Never Smoker  . Smokeless tobacco: Never Used  . Alcohol use No     Allergies   Codeine; Morphine and related; Ambien [zolpidem tartrate]; Lyrica [pregabalin]; and Neurontin [gabapentin]   Review of Systems Review of Systems  Constitutional: Negative for fever.  HENT: Negative for congestion.   Eyes: Negative for redness.  Respiratory: Negative for shortness of breath.   Cardiovascular: Negative for chest pain.  Gastrointestinal: Positive for abdominal pain. Negative for diarrhea and vomiting.  Genitourinary: Positive for flank pain. Negative for dysuria.  Musculoskeletal: Negative for back pain.  Skin: Negative for rash.  Neurological: Negative for headaches.  Psychiatric/Behavioral: Negative for confusion.     Physical Exam Updated Vital Signs BP 123/62   Pulse (!) 54   Temp 98.3 F (36.8 C) (Oral)    Resp 18   Wt 63.5 kg (140 lb)   SpO2 97%   BMI 25.61 kg/m   Physical Exam  Constitutional: She is oriented to person, place, and time. She appears well-developed and well-nourished. No distress.  HENT:  Head: Normocephalic and atraumatic.  Mouth/Throat: Oropharynx is clear and moist.  Eyes: Pupils are equal, round, and reactive to light. EOM are normal.  Neck: Normal range of motion. Neck supple.  Cardiovascular: Normal rate and regular rhythm.   Pulmonary/Chest:  Effort normal and breath sounds normal. No respiratory distress.  Abdominal: Soft. Bowel sounds are normal. There is tenderness.  Mouth tenderness right lower quadrant no guarding  Musculoskeletal: Normal range of motion.  Neurological: She is alert and oriented to person, place, and time. No cranial nerve deficit or sensory deficit. She exhibits normal muscle tone. Coordination normal.  Skin: Skin is warm.  Nursing note and vitals reviewed.    ED Treatments / Results  Labs (all labs ordered are listed, but only abnormal results are displayed) Labs Reviewed  COMPREHENSIVE METABOLIC PANEL - Abnormal; Notable for the following:       Result Value   Glucose, Bld 104 (*)    Total Bilirubin 0.1 (*)    All other components within normal limits  CBC  URINALYSIS, ROUTINE W REFLEX MICROSCOPIC  LIPASE, BLOOD    EKG  EKG Interpretation None       Radiology Ct Abdomen Pelvis W Contrast  Result Date: 12/12/2016 CLINICAL DATA:  Right lower quadrant abdominal pain radiating into the back for the last 2 days. EXAM: CT ABDOMEN AND PELVIS WITH CONTRAST TECHNIQUE: Multidetector CT imaging of the abdomen and pelvis was performed using the standard protocol following bolus administration of intravenous contrast. CONTRAST:  168m ISOVUE-300 IOPAMIDOL (ISOVUE-300) INJECTION 61% COMPARISON:  Report from 12/24/2013 FINDINGS: Lower chest: Right middle lobe and lingular scarring. Mild scarring or atelectasis in the right lower lobe.  Moderate sized type 3 hiatal hernia. Hepatobiliary: There is mild intrahepatic biliary dilatation. Common hepatic duct 1.4 cm. Common bile duct 1.1 cm. Conical tapering of the CBD distally. No obvious filling defect is observed in the CBD. Prior cholecystectomy. Mildly dilated cystic duct remnant. Pancreas: Unremarkable Spleen: Unremarkable Adrenals/Urinary Tract: There calcifications near the distal ureters but no intraureteral calcification is identified. No urinary tract calculi or hydronephrosis. No abnormal renal parenchymal enhancement. Adrenal glands appear normal. Stomach/Bowel: Sigmoid colon diverticulosis without active diverticulitis. Normal appendix. Terminal ileum unremarkable. Vascular/Lymphatic: Aortoiliac atherosclerotic vascular disease. No pathologic adenopathy identified. Reproductive: Unremarkable Other: No supplemental non-categorized findings. Musculoskeletal: Asymmetric degenerative arthropathy of the left hip with associated spurring. Cortical thickening along the left femoral neck, query remote trauma. Posterolateral rod and pedicle screw fixation at L4-5 with solid interbody fusion. No definite lumbar impingement. IMPRESSION: 1. Mild intrahepatic and extrahepatic biliary dilatation. Some of this may be a physiologic response to prior cholecystectomy. Correlate with bilirubin levels in determining need for further workup such as MRCP or ERCP. 2. Appendix appears normal.  No nephrolithiasis. 3. Moderate-sized type 3 hiatal hernia. 4. Mild bibasilar scarring. 5. Sigmoid diverticulosis without active diverticulitis. 6.  Aortic Atherosclerosis (ICD10-I70.0). 7. Postoperative findings at LG6-2without complicating feature. 8. Asymmetric degenerative arthropathy of the left hip, with spurring and cortical thickening/buttressing along the left femoral neck, possibly a reflection of old trauma. Electronically Signed   By: WVan ClinesM.D.   On: 12/12/2016 20:23    Procedures Procedures  (including critical care time)  Medications Ordered in ED Medications  0.9 %  sodium chloride infusion ( Intravenous Bolus from Bag 12/12/16 1930)  sodium chloride 0.9 % bolus 500 mL (0 mLs Intravenous Stopped 12/12/16 2045)  ondansetron (ZOFRAN) injection 4 mg (4 mg Intravenous Given 12/12/16 1928)  HYDROmorphone (DILAUDID) injection 1 mg (1 mg Intravenous Given 12/12/16 1928)  iopamidol (ISOVUE-300) 61 % injection 100 mL (100 mLs Intravenous Contrast Given 12/12/16 1950)     Initial Impression / Assessment and Plan / ED Course  I have reviewed the triage vital signs and  the nursing notes.  Pertinent labs & imaging results that were available during my care of the patient were reviewed by me and considered in my medical decision making (see chart for details).     Patient workup for right lower quadrant abdominal pain with any acute findings on CT scan. Labs without any sniffing abnormalities. Will discharge symptomatically.  Final Clinical Impressions(s) / ED Diagnoses   Final diagnoses:  Right lower quadrant abdominal pain    New Prescriptions New Prescriptions   HYDROMORPHONE (DILAUDID) 4 MG TABLET    Take 1 tablet (4 mg total) by mouth every 6 (six) hours as needed for severe pain.     Fredia Sorrow, MD 12/12/16 2138

## 2016-12-12 NOTE — Discharge Instructions (Signed)
Workup for the abdominal pain without any acute findings. Take pain medicine as needed. Follow-up with your doctor if not improved. Return for any new or worse symptoms.

## 2016-12-12 NOTE — ED Triage Notes (Signed)
rlq pain radiating into back x 2 days. Denies n/v/d.

## 2016-12-15 ENCOUNTER — Ambulatory Visit (INDEPENDENT_AMBULATORY_CARE_PROVIDER_SITE_OTHER): Payer: Medicare HMO | Admitting: Nurse Practitioner

## 2016-12-15 ENCOUNTER — Encounter: Payer: Self-pay | Admitting: Nurse Practitioner

## 2016-12-15 DIAGNOSIS — M47812 Spondylosis without myelopathy or radiculopathy, cervical region: Secondary | ICD-10-CM | POA: Diagnosis not present

## 2016-12-15 DIAGNOSIS — M47817 Spondylosis without myelopathy or radiculopathy, lumbosacral region: Secondary | ICD-10-CM | POA: Diagnosis not present

## 2016-12-15 MED ORDER — DIAZEPAM 5 MG PO TABS: ORAL_TABLET | ORAL | 2 refills | Status: DC

## 2016-12-15 MED ORDER — HYDROCODONE-ACETAMINOPHEN 7.5-325 MG PO TABS: 1.0000 | ORAL_TABLET | Freq: Two times a day (BID) | ORAL | 0 refills | Status: DC

## 2016-12-15 MED ORDER — HYDROCODONE-ACETAMINOPHEN 7.5-325 MG PO TABS: 1.0000 | ORAL_TABLET | Freq: Four times a day (QID) | ORAL | 0 refills | Status: DC | PRN

## 2016-12-15 NOTE — Patient Instructions (Signed)

## 2016-12-15 NOTE — Addendum Note (Signed)
Addended by: Bennie Pierini on: 12/15/2016 10:47 AM   Modules accepted: Orders

## 2016-12-15 NOTE — Progress Notes (Signed)
   Subjective:    Patient ID: [name], female    DOB: 1955/12/05, 61 y.o.   MRN: [id number]  HPI  Patient is here today for pain management. Pain assessment: Cause of pain- spondylitis and DDD- sees Dr. Otelia Sergeant at times Pain location- mainly in lower back Pain on scale of 1-10- 3-5/10 currently Frequency- daily What increases pain-movement What makes pain Better-resting Effects on ADL - some days she is not able to do anything Any change in general medical condition-none  Current medications- norco 7.5/325 Effectiveness of current meds-pain is never completely gone Adverse reactions form pain meds-none Morphine equivalent>20  Pill count performed-No Urine drug screen- Yes Was the NCCSR reviewed- yes  If yes were their any concerning findings? - no discrepency  Pain contract signed on: new contract signed today 12/15/16    Review of Systems  Constitutional: Negative.   Respiratory: Negative.   Cardiovascular: Negative.   Musculoskeletal: Positive for back pain and neck pain.  Neurological: Negative.   Psychiatric/Behavioral: Negative.   All other systems reviewed and are negative.      Objective:   Physical Exam  Constitutional: She appears well-developed and well-nourished. No distress.  Cardiovascular: Normal rate and regular rhythm.   Pulmonary/Chest: Effort normal and breath sounds normal.  Musculoskeletal:  Pain on flexion and extension of lumbar spine  Skin: Skin is warm.  Psychiatric: She has a normal mood and affect. Her behavior is normal. Judgment and thought content normal.   BP 98/62   Pulse 78   Temp 98.1 F (36.7 C) (Oral)   Ht 5\' 2"  (1.575 m)   Wt 151 lb (68.5 kg)   BMI 27.62 kg/m      Assessment & Plan:   1. Lumbosacral spondylosis without myelopathy    Meds ordered this encounter  Medications  . HYDROcodone-acetaminophen (NORCO) 7.5-325 MG tablet    Sig: Take 1 tablet by mouth 2 (two) times daily.    Dispense:  60 tablet   Refill:  0    Order Specific Question:   Supervising Provider    Answer:   VINCENT, CAROL L [[id number]]  . HYDROcodone-acetaminophen (NORCO) 7.5-325 MG tablet    Sig: Take 1 tablet by mouth 2 (two) times daily.    Dispense:  60 tablet    Refill:  0    DO NOT FILL TILL 01/13/17    Order Specific Question:   Supervising Provider    Answer:   01/15/17 L [[id number]]  . HYDROcodone-acetaminophen (NORCO) 7.5-325 MG tablet    Sig: Take 1 tablet by mouth every 6 (six) hours as needed for moderate pain.    Dispense:  30 tablet    Refill:  0    DO NOT FILL TILL 02/12/17    Order Specific Question:   Supervising Provider    Answer:   02/14/17 [[id number]]   Back strengthening exercises reviewed Drug screen pending Follow up in 3 months  [name] Johna Sheriff, FNP

## 2016-12-16 ENCOUNTER — Telehealth: Payer: Self-pay | Admitting: Nurse Practitioner

## 2016-12-16 ENCOUNTER — Other Ambulatory Visit: Payer: Self-pay | Admitting: Nurse Practitioner

## 2016-12-16 DIAGNOSIS — F331 Major depressive disorder, recurrent, moderate: Secondary | ICD-10-CM

## 2016-12-16 DIAGNOSIS — F5101 Primary insomnia: Secondary | ICD-10-CM

## 2016-12-16 DIAGNOSIS — R569 Unspecified convulsions: Secondary | ICD-10-CM

## 2016-12-16 DIAGNOSIS — K2100 Gastro-esophageal reflux disease with esophagitis, without bleeding: Secondary | ICD-10-CM

## 2016-12-16 DIAGNOSIS — E785 Hyperlipidemia, unspecified: Secondary | ICD-10-CM

## 2016-12-16 MED ORDER — PREDNISONE 20 MG PO TABS: ORAL_TABLET | ORAL | 0 refills | Status: DC

## 2016-12-16 NOTE — Telephone Encounter (Signed)
Left detailed message regarding RX 

## 2016-12-19 LAB — TOXASSURE SELECT 13 (MW), URINE

## 2016-12-29 ENCOUNTER — Other Ambulatory Visit: Payer: Self-pay | Admitting: Nurse Practitioner

## 2016-12-30 ENCOUNTER — Other Ambulatory Visit: Payer: Self-pay

## 2017-01-02 ENCOUNTER — Other Ambulatory Visit: Payer: Self-pay | Admitting: Nurse Practitioner

## 2017-01-02 ENCOUNTER — Other Ambulatory Visit: Payer: Self-pay

## 2017-01-02 DIAGNOSIS — G44229 Chronic tension-type headache, not intractable: Secondary | ICD-10-CM

## 2017-01-02 MED ORDER — ALBUTEROL SULFATE HFA 108 (90 BASE) MCG/ACT IN AERS: 2.0000 | INHALATION_SPRAY | Freq: Four times a day (QID) | RESPIRATORY_TRACT | 2 refills | Status: DC | PRN

## 2017-01-02 NOTE — Telephone Encounter (Signed)
Patient last seen in office on 12/15/16. Rx last filled on 6/26 for #20 with 2 RFS. Please advise

## 2017-01-03 ENCOUNTER — Telehealth: Payer: Self-pay | Admitting: Nurse Practitioner

## 2017-01-05 ENCOUNTER — Telehealth (INDEPENDENT_AMBULATORY_CARE_PROVIDER_SITE_OTHER): Payer: Self-pay | Admitting: Specialist

## 2017-01-05 NOTE — Telephone Encounter (Signed)
Patient notified that we cannot do those type of injections here and she should contact her orthopedic. Patient verbalized understanding

## 2017-01-05 NOTE — Telephone Encounter (Signed)
Patient left message wanting to schedule a c-spine injection with Dr. Alvester Morin ASAP. She has not seen Dr. Otelia Sergeant or Dr. Alvester Morin since 2016 that I can see. Please advise. (Routing comment)

## 2017-01-06 MED ORDER — BUTALBITAL-APAP-CAFFEINE 50-325-40 MG PO TABS: ORAL_TABLET | ORAL | 1 refills | Status: DC

## 2017-01-06 NOTE — Telephone Encounter (Signed)
Recommend reevaluation unless Dr. Alvester Morin feels comfortable performing without a request. jen

## 2017-01-06 NOTE — Telephone Encounter (Signed)
Please call in fioricet with 1 refills 

## 2017-01-06 NOTE — Telephone Encounter (Signed)
Rx called to pharmacy

## 2017-01-08 NOTE — Telephone Encounter (Signed)
Can you ask if he is ok with this, Dr. Barbaraann Faster next available is mid november

## 2017-01-08 NOTE — Telephone Encounter (Signed)
Please advise 

## 2017-01-09 NOTE — Telephone Encounter (Signed)
No she needs to be seen by one of [name] since that long. FYI she goes by [employer] or [place]. It is her [place] name.

## 2017-01-09 NOTE — Telephone Encounter (Signed)
Left message for patient to call to schedule/ discuss.

## 2017-01-09 NOTE — Telephone Encounter (Signed)
Scheduled for 01/20/17 at 1000.

## 2017-01-14 ENCOUNTER — Ambulatory Visit: Payer: Medicare HMO | Admitting: Nurse Practitioner

## 2017-01-19 ENCOUNTER — Encounter: Payer: Self-pay | Admitting: Nurse Practitioner

## 2017-01-19 ENCOUNTER — Ambulatory Visit (INDEPENDENT_AMBULATORY_CARE_PROVIDER_SITE_OTHER): Payer: Medicare HMO | Admitting: Nurse Practitioner

## 2017-01-19 VITALS — BP 114/73 | HR 69 | Temp 98.4°F | Ht 62.0 in | Wt 142.0 lb

## 2017-01-19 DIAGNOSIS — M47817 Spondylosis without myelopathy or radiculopathy, lumbosacral region: Secondary | ICD-10-CM

## 2017-01-19 DIAGNOSIS — J4540 Moderate persistent asthma, uncomplicated: Secondary | ICD-10-CM

## 2017-01-19 DIAGNOSIS — M47812 Spondylosis without myelopathy or radiculopathy, cervical region: Secondary | ICD-10-CM | POA: Diagnosis not present

## 2017-01-19 DIAGNOSIS — M25551 Pain in right hip: Secondary | ICD-10-CM

## 2017-01-19 DIAGNOSIS — F411 Generalized anxiety disorder: Secondary | ICD-10-CM | POA: Diagnosis not present

## 2017-01-19 DIAGNOSIS — B009 Herpesviral infection, unspecified: Secondary | ICD-10-CM

## 2017-01-19 DIAGNOSIS — I10 Essential (primary) hypertension: Secondary | ICD-10-CM

## 2017-01-19 DIAGNOSIS — R3915 Urgency of urination: Secondary | ICD-10-CM | POA: Diagnosis not present

## 2017-01-19 DIAGNOSIS — R55 Syncope and collapse: Secondary | ICD-10-CM | POA: Diagnosis not present

## 2017-01-19 DIAGNOSIS — K2100 Gastro-esophageal reflux disease with esophagitis, without bleeding: Secondary | ICD-10-CM

## 2017-01-19 DIAGNOSIS — R569 Unspecified convulsions: Secondary | ICD-10-CM

## 2017-01-19 DIAGNOSIS — F331 Major depressive disorder, recurrent, moderate: Secondary | ICD-10-CM

## 2017-01-19 DIAGNOSIS — F5101 Primary insomnia: Secondary | ICD-10-CM

## 2017-01-19 DIAGNOSIS — M810 Age-related osteoporosis without current pathological fracture: Secondary | ICD-10-CM | POA: Diagnosis not present

## 2017-01-19 DIAGNOSIS — J309 Allergic rhinitis, unspecified: Secondary | ICD-10-CM

## 2017-01-19 DIAGNOSIS — E785 Hyperlipidemia, unspecified: Secondary | ICD-10-CM

## 2017-01-19 DIAGNOSIS — G4733 Obstructive sleep apnea (adult) (pediatric): Secondary | ICD-10-CM | POA: Diagnosis not present

## 2017-01-19 MED ORDER — VALACYCLOVIR HCL 500 MG PO TABS: ORAL_TABLET | ORAL | 5 refills | Status: DC

## 2017-01-19 MED ORDER — FLUTICASONE FUROATE-VILANTEROL 200-25 MCG/INH IN AEPB: 1.0000 | INHALATION_SPRAY | Freq: Every day | RESPIRATORY_TRACT | 5 refills | Status: DC

## 2017-01-19 MED ORDER — DIAZEPAM 5 MG PO TABS: ORAL_TABLET | ORAL | 2 refills | Status: DC

## 2017-01-19 MED ORDER — BUSPIRONE HCL 10 MG PO TABS: ORAL_TABLET | ORAL | 2 refills | Status: DC

## 2017-01-19 MED ORDER — DEXLANSOPRAZOLE 60 MG PO CPDR: 1.0000 | DELAYED_RELEASE_CAPSULE | Freq: Every day | ORAL | 5 refills | Status: DC

## 2017-01-19 MED ORDER — TRAZODONE HCL 150 MG PO TABS: ORAL_TABLET | ORAL | 2 refills | Status: DC

## 2017-01-19 MED ORDER — METHYLPREDNISOLONE ACETATE 80 MG/ML IJ SUSP
80.0000 mg | Freq: Once | INTRAMUSCULAR | Status: AC
  Administered 2017-01-19: 80 mg via INTRAMUSCULAR

## 2017-01-19 MED ORDER — ESCITALOPRAM OXALATE 20 MG PO TABS: ORAL_TABLET | ORAL | 5 refills | Status: DC

## 2017-01-19 MED ORDER — TOLTERODINE TARTRATE 2 MG PO TABS: ORAL_TABLET | ORAL | 5 refills | Status: DC

## 2017-01-19 MED ORDER — DULOXETINE HCL 60 MG PO CPEP: 60.0000 mg | ORAL_CAPSULE | Freq: Every day | ORAL | 5 refills | Status: DC

## 2017-01-19 MED ORDER — FUROSEMIDE 40 MG PO TABS: ORAL_TABLET | ORAL | 1 refills | Status: DC

## 2017-01-19 NOTE — Progress Notes (Signed)
Subjective:    Patient ID: [name], female    DOB: [birth year], 61 y.o.   MRN: [id number]  HPI Patient come sin today for follow up of chronic medical problems. She has not been doing well as of late. She has had flare ups of her pain in back and neck. SHe would like a steroid shot today because that always seem to help. She has been taking care of her brother who has a drug addiction and that has added stress on her. She has multiple medical problems and is on lots of meds. SHe has appointment with Dr. Ernestina Patches about her neck pain tomorrow. She is currently living with her granddaughter.  SYNCOPE  Essential hypertension  Obstructive sleep apnea syndrome  Gastroesophageal reflux disease with esophagitis  Age-related osteoporosis without current pathological fracture  Cervical spondylosis without myelopathy  Lumbosacral spondylosis without myelopathy  Urinary urgency  Seizures (HCC)  Primary insomnia  Hyperlipidemia with target LDL less than 100  GAD (generalized anxiety disorder)  Anxiety state  Outpatient Encounter Prescriptions as of 01/19/2017  Medication Sig  . ACCU-CHEK FASTCLIX LANCETS MISC Use to check blood sugar BID  . acyclovir ointment (ZOVIRAX) 5 % APPLY TO AFFECTED AREA EVERY 3 HOURS  . albuterol (PROVENTIL HFA;VENTOLIN HFA) 108 (90 Base) MCG/ACT inhaler Inhale 2 puffs into the lungs every 6 (six) hours as needed for wheezing or shortness of breath.  [name] albuterol (PROVENTIL) (2.5 MG/3ML) 0.083% nebulizer solution USE 1 VIAL IN NEBULIZER EVERY 4 HOURS AS NEEDED FOR WHEEZING.  [name] Alcohol Swabs (B-D SINGLE USE SWABS REGULAR) PADS Use to check BS twice daily  . Blood Glucose Calibration (ACCU-CHEK SMARTVIEW CONTROL) LIQD Use as directed  . BREO ELLIPTA 200-25 MCG/INH AEPB INHALE 1 PUFF DAILY.  . busPIRone (BUSPAR) 10 MG tablet TAKE (1) TABLET TWICE DAILY.  . butalbital-acetaminophen-caffeine (FIORICET, ESGIC) 50-325-40 MG tablet TAKE 1 TABLET TWICE DAILY AS  NEEDED FOR HEADACHE.  [name] dexlansoprazole (DEXILANT) 60 MG capsule Take 1 capsule (60 mg total) by mouth daily.  . diazepam (VALIUM) 5 MG tablet TAKE 1 TABLET EVERY 12 HOURS AS NEEDED FOR ANXIETY.  . DULoxetine (CYMBALTA) 60 MG capsule TAKE 1 CAPSULE BY MOUTH AT BEDTIME.  [name] escitalopram (LEXAPRO) 20 MG tablet TAKE (1) TABLET BY MOUTH ONCE DAILY.  [name] estradiol (ESTRACE VAGINAL) 0.1 MG/GM vaginal cream Place 1 Applicatorful vaginally at bedtime.  . fludrocortisone (FLORINEF) 0.1 MG tablet Take 2 tablets (0.2 mg total) by mouth daily.  . furosemide (LASIX) 40 MG tablet TAKE (1) TABLET BY MOUTH ONCE DAILY.  [name] glucose blood (ACCU-CHEK SMARTVIEW) test strip Use as instructed  . glucose blood (ONETOUCH VERIO) test strip Check blood sugar daily-- dx E11.9  . HYDROcodone-acetaminophen (NORCO) 7.5-325 MG tablet Take 1 tablet by mouth 2 (two) times daily.  [name] HYDROcodone-acetaminophen (NORCO) 7.5-325 MG tablet Take 1 tablet by mouth 2 (two) times daily.  [name] HYDROcodone-acetaminophen (NORCO) 7.5-325 MG tablet Take 1 tablet by mouth every 6 (six) hours as needed for moderate pain.  [name] ipratropium (ATROVENT) 0.02 % nebulizer solution USE 1 VIAL IN NEBULIZER EVERY 4 HOURS AS NEEDED FOR WHEEZING.  [name] lamoTRIgine (LAMICTAL) 150 MG tablet Take 1 tablet (150 mg total) by mouth daily.  [name] levETIRAcetam (KEPPRA) 500 MG tablet TAKE (1) TABLET TWICE DAILY.  [name] Loratadine 10 MG CAPS Take 1 capsule (10 mg total) by mouth daily.  . Menthol, Topical Analgesic, (BIOFREEZE ROLL-ON) 4 % GEL Apply topically.  . methocarbamol (ROBAXIN) 500 MG tablet Take 500 mg  by mouth every 8 (eight) hours as needed for muscle spasms.  . miconazole (MICOTIN) 2 % cream Apply 1 application topically 2 (two) times daily.  . midodrine (PROAMATINE) 5 MG tablet TAKE (1/2) TABLET THREE TIMES DAILY.  . nitroGLYCERIN (NITROSTAT) 0.4 MG SL tablet PLACE ONE (1) TABLET UNDER TONGUE EVERY 5 MINUTES UP TO (3) DOSES AS NEEDED FOR CHEST PAIN.  [name] ONETOUCH DELICA  LANCETS 11H MISC 1 daily to keep diary of blood sugars-- Dx E11.9  . oxybutynin (DITROPAN) 5 MG tablet TAKE (1) TABLET TWICE DAILY.  . ranitidine (ZANTAC) 150 MG tablet TAKE 1 TABLET AT BEDTIME.  . rosuvastatin (CRESTOR) 10 MG tablet TAKE (1) TABLET BY MOUTH ONCE DAILY.  [name] tolterodine (DETROL) 2 MG tablet TAKE (1) TABLET TWICE DAILY.  . traZODone (DESYREL) 150 MG tablet TAKE (1) TABLET DAILY AT BEDTIME.  . valACYclovir (VALTREX) 500 MG tablet TAKE (1) TABLET TWICE DAILY.  . [DISCONTINUED] blood glucose meter kit and supplies KIT Dispense based on patient and insurance preference. Use up to four times daily as directed. (FOR ICD-9 250.00, 250.01).      Review of Systems  Constitutional: Negative.   HENT: Negative.   Respiratory: Negative for cough and shortness of breath.   Cardiovascular: Negative for chest pain, palpitations and leg swelling.  Gastrointestinal: Negative.   Genitourinary: Negative.   Musculoskeletal: Positive for back pain, myalgias and neck pain.  Neurological: Negative.   Psychiatric/Behavioral: Negative.   All other systems reviewed and are negative.      Objective:   Physical Exam  Constitutional: She is oriented to person, place, and time. She appears well-developed and well-nourished. No distress.  HENT:  Right Ear: External ear normal.  Left Ear: External ear normal.  Nose: Nose normal.  Mouth/Throat: Oropharynx is clear and moist.  Eyes: Pupils are equal, round, and reactive to light.  Cardiovascular: Normal rate and regular rhythm.   Pulmonary/Chest: Effort normal and breath sounds normal.  Abdominal: Soft. Bowel sounds are normal.  Musculoskeletal:  pain in right hip with full abduction. LImited ROM of cervical spine due to pain with movement in any direction. Gripe equal bil. Motor strength and sensation bil upper ext intact.  Neurological: She is alert and oriented to person, place, and time. She has normal reflexes. No cranial nerve deficit.    Skin: Skin is warm and dry.  Psychiatric: She has a normal mood and affect. Her behavior is normal. Judgment and thought content normal.   BP 114/73   Pulse 69   Temp 98.4 F (36.9 C) (Oral)   Ht 5' 2"  (1.575 m)   Wt 142 lb (64.4 kg)   BMI 25.97 kg/m       Assessment & Plan:   1. SYNCOPE   2. Essential hypertension   3. Obstructive sleep apnea syndrome   4. Gastroesophageal reflux disease with esophagitis   5. Age-related osteoporosis without current pathological fracture   6. Cervical spondylosis without myelopathy   7. Lumbosacral spondylosis without myelopathy   8. Urinary urgency   9. Seizures ([place])   10. Primary insomnia   11. Hyperlipidemia with target LDL less than 100   12. GAD (generalized anxiety disorder)   13. Anxiety state   14. Allergic rhinitis, unspecified seasonality, unspecified trigger   15. Herpes simplex virus (HSV) infection   76. Moderate episode of recurrent major depressive disorder ([place])   17. Right hip pain    Orders Placed This Encounter  Procedures  . CMP14+EGFR  . Lipid  panel   Meds ordered this encounter  Medications  . fluticasone furoate-vilanterol (BREO ELLIPTA) 200-25 MCG/INH AEPB    Sig: Inhale 1 puff into the lungs daily.    Dispense:  60 each    Refill:  5    Order Specific Question:   Supervising Provider    Answer:   VINCENT, CAROL L [[id number]]  . busPIRone (BUSPAR) 10 MG tablet    Sig: TAKE (1) TABLET TWICE DAILY.    Dispense:  60 tablet    Refill:  2    Order Specific Question:   Supervising Provider    Answer:   VINCENT, CAROL L [[id number]]  . diazepam (VALIUM) 5 MG tablet    Sig: TAKE 1 TABLET EVERY 12 HOURS AS NEEDED FOR ANXIETY.    Dispense:  60 tablet    Refill:  2    Order Specific Question:   Supervising Provider    Answer:   VINCENT, CAROL L [[id number]]  . furosemide (LASIX) 40 MG tablet    Sig: TAKE (1) TABLET BY MOUTH ONCE DAILY.    Dispense:  90 tablet    Refill:  1    Order Specific Question:   Supervising  Provider    Answer:   VINCENT, CAROL L [[id number]]  . valACYclovir (VALTREX) 500 MG tablet    Sig: TAKE (1) TABLET TWICE DAILY.    Dispense:  60 tablet    Refill:  5    Order Specific Question:   Supervising Provider    Answer:   VINCENT, CAROL L [[id number]]  . escitalopram (LEXAPRO) 20 MG tablet    Sig: TAKE (1) TABLET BY MOUTH ONCE DAILY.    Dispense:  30 tablet    Refill:  5    Order Specific Question:   Supervising Provider    Answer:   VINCENT, CAROL L [[id number]]  . DULoxetine (CYMBALTA) 60 MG capsule    Sig: Take 1 capsule (60 mg total) by mouth at bedtime.    Dispense:  30 capsule    Refill:  5    Order Specific Question:   Supervising Provider    Answer:   VINCENT, CAROL L [[id number]]  . traZODone (DESYREL) 150 MG tablet    Sig: TAKE (1) TABLET DAILY AT BEDTIME.    Dispense:  30 tablet    Refill:  2    Order Specific Question:   Supervising Provider    Answer:   VINCENT, CAROL L [[id number]]  . dexlansoprazole (DEXILANT) 60 MG capsule    Sig: Take 1 capsule (60 mg total) by mouth daily.    Dispense:  30 capsule    Refill:  5    Order Specific Question:   Supervising Provider    Answer:   VINCENT, CAROL L [[id number]]  . tolterodine (DETROL) 2 MG tablet    Sig: TAKE (1) TABLET TWICE DAILY.    Dispense:  60 tablet    Refill:  5    Order Specific Question:   Supervising Provider    Answer:   VINCENT, CAROL L [[id number]]  . methylPREDNISolone acetate (DEPO-MEDROL) injection 80 mg   Stretching exercises encouraged Rest Labs pending Diet encouraged Keep follow up with specialist tomorrow for neck RTO in 3 months follow up  [place], FNP

## 2017-01-20 ENCOUNTER — Ambulatory Visit (INDEPENDENT_AMBULATORY_CARE_PROVIDER_SITE_OTHER): Payer: Medicare HMO | Admitting: Physical Medicine and Rehabilitation

## 2017-01-20 LAB — LIPID PANEL
CHOL/HDL RATIO: 3 ratio (ref 0.0–4.4)
Cholesterol, Total: 172 mg/dL (ref 100–199)
HDL: 58 mg/dL (ref >39)
LDL CALC: 81 mg/dL (ref 0–99)
TRIGLYCERIDES: 165 mg/dL — AB (ref 0–149)
VLDL Cholesterol Cal: 33 mg/dL (ref 5–40)

## 2017-01-20 LAB — CMP14+EGFR
ALT: 9 IU/L (ref 0–32)
AST: 12 IU/L (ref 0–40)
Albumin/Globulin Ratio: 1.8 (ref 1.2–2.2)
Albumin: 4.6 g/dL (ref 3.6–4.8)
Alkaline Phosphatase: 97 IU/L (ref 39–117)
BUN/Creatinine Ratio: 14 (ref 12–28)
BUN: 18 mg/dL (ref 8–27)
Bilirubin Total: 0.3 mg/dL (ref 0.0–1.2)
CALCIUM: 10.1 mg/dL (ref 8.7–10.3)
CO2: 26 mmol/L (ref 20–29)
CREATININE: 1.25 mg/dL — AB (ref 0.57–1.00)
Chloride: 102 mmol/L (ref 96–106)
GFR, EST AFRICAN AMERICAN: 54 mL/min/{1.73_m2} — AB (ref >59)
GFR, EST NON AFRICAN AMERICAN: 47 mL/min/{1.73_m2} — AB (ref >59)
GLOBULIN, TOTAL: 2.6 g/dL (ref 1.5–4.5)
Glucose: 102 mg/dL — ABNORMAL HIGH (ref 65–99)
Potassium: 4.7 mmol/L (ref 3.5–5.2)
SODIUM: 148 mmol/L — AB (ref 134–144)
TOTAL PROTEIN: 7.2 g/dL (ref 6.0–8.5)

## 2017-01-23 ENCOUNTER — Ambulatory Visit (INDEPENDENT_AMBULATORY_CARE_PROVIDER_SITE_OTHER): Payer: Medicare HMO | Admitting: Physical Medicine and Rehabilitation

## 2017-01-29 ENCOUNTER — Ambulatory Visit (INDEPENDENT_AMBULATORY_CARE_PROVIDER_SITE_OTHER): Payer: Medicare HMO | Admitting: Physical Medicine and Rehabilitation

## 2017-01-29 ENCOUNTER — Other Ambulatory Visit: Payer: Self-pay | Admitting: Nurse Practitioner

## 2017-01-29 ENCOUNTER — Encounter (INDEPENDENT_AMBULATORY_CARE_PROVIDER_SITE_OTHER): Payer: Self-pay

## 2017-02-12 ENCOUNTER — Telehealth: Payer: Self-pay | Admitting: Nurse Practitioner

## 2017-02-13 MED ORDER — METRONIDAZOLE 500 MG PO TABS: 500.0000 mg | ORAL_TABLET | Freq: Two times a day (BID) | ORAL | 0 refills | Status: DC

## 2017-02-13 MED ORDER — CIPROFLOXACIN HCL 500 MG PO TABS: 500.0000 mg | ORAL_TABLET | Freq: Two times a day (BID) | ORAL | 0 refills | Status: DC

## 2017-02-17 ENCOUNTER — Telehealth: Payer: Self-pay | Admitting: Nurse Practitioner

## 2017-02-17 NOTE — Telephone Encounter (Signed)
cipro and flagyl were sent in on 11/16- the cipro should take care of it. If not getting better let me know.

## 2017-02-17 NOTE — Telephone Encounter (Signed)
Aware to use bacitracin or any antibiotic cream after thourough cleaning. Watch for redness, swelling, exude or pain.  Make an appointment to see provider if no improvement.

## 2017-02-17 NOTE — Telephone Encounter (Signed)
Can you send in an antibiotic or would you prefer she go to an urgent care where she is now?

## 2017-02-18 NOTE — Telephone Encounter (Signed)
Patient aware that medication was sent to pharmacy and should cover cat bite

## 2017-02-25 ENCOUNTER — Other Ambulatory Visit: Payer: Self-pay | Admitting: Nurse Practitioner

## 2017-02-25 ENCOUNTER — Other Ambulatory Visit: Payer: Self-pay | Admitting: Cardiology

## 2017-02-25 DIAGNOSIS — N941 Unspecified dyspareunia: Secondary | ICD-10-CM

## 2017-03-02 ENCOUNTER — Other Ambulatory Visit: Payer: Self-pay | Admitting: Nurse Practitioner

## 2017-03-03 ENCOUNTER — Other Ambulatory Visit: Payer: Self-pay | Admitting: *Deleted

## 2017-03-03 MED ORDER — FLUDROCORTISONE ACETATE 0.1 MG PO TABS: 0.2000 mg | ORAL_TABLET | Freq: Every day | ORAL | 1 refills | Status: DC

## 2017-03-05 ENCOUNTER — Ambulatory Visit (INDEPENDENT_AMBULATORY_CARE_PROVIDER_SITE_OTHER): Payer: Medicare HMO | Admitting: Pediatrics

## 2017-03-05 ENCOUNTER — Encounter: Payer: Self-pay | Admitting: Pediatrics

## 2017-03-05 VITALS — BP 121/77 | HR 57 | Temp 97.9°F | Ht 62.0 in | Wt 143.0 lb

## 2017-03-05 DIAGNOSIS — J3089 Other allergic rhinitis: Secondary | ICD-10-CM | POA: Diagnosis not present

## 2017-03-05 DIAGNOSIS — M7062 Trochanteric bursitis, left hip: Secondary | ICD-10-CM

## 2017-03-05 DIAGNOSIS — M7061 Trochanteric bursitis, right hip: Secondary | ICD-10-CM | POA: Diagnosis not present

## 2017-03-05 MED ORDER — LEVOCETIRIZINE DIHYDROCHLORIDE 5 MG PO TABS: 5.0000 mg | ORAL_TABLET | Freq: Every evening | ORAL | 1 refills | Status: DC

## 2017-03-05 NOTE — Patient Instructions (Signed)
Sinus rinses with neti pot of similar every day  Nasal congestion: Netipot with distilled water 2-3 times a day to clear out sinuses Or Normal saline nasal spray Flonase steroid nasal spray  Throat lozenges chloroseptic spray  Stick with bland foods Drink lots of fluids

## 2017-03-05 NOTE — Progress Notes (Signed)
  Subjective:   Patient ID: [name], female    DOB: May 08, 1955, 61 y.o.   MRN: [id number] CC: Hip injection  HPI: [name] is a 61 y.o. female presenting for Hip injection  Patient has a history of trochanteric bursitis Last had steroid injections 4 months ago Per patient did have a fair amount of relief of symptoms after injections Would like to repeat the injections today Has pain in bilateral hips Worse after she is sitting or leaning on one or the other  Most recent blood sugar average 5.6 in June 2018  Has had more allergy symptoms in the last few weeks Taking loratadine daily with minimal relief now Runny nose regularly, has been clear  no fevers, no cough, no sore throat  Relevant past medical, surgical, family and social history reviewed. Allergies and medications reviewed and updated. Social History   Tobacco Use  Smoking Status Never Smoker  Smokeless Tobacco Never Used   ROS: Per HPI   Objective:    BP 121/77   Pulse (!) 57   Temp 97.9 F (36.6 C) (Oral)   Ht 5\' 2"  (1.575 m)   Wt 143 lb (64.9 kg)   BMI 26.16 kg/m   Wt Readings from Last 3 Encounters:  03/05/17 143 lb (64.9 kg)  01/19/17 142 lb (64.4 kg)  12/15/16 151 lb (68.5 kg)    Gen: NAD, alert, cooperative with exam, NCAT EYES: EOMI, no conjunctival injection, or no icterus ENT: TMs bilaterally normal, OP without erythema LYMPH: no cervical LAD CV: NRRR, normal S1/S2 Resp: CTABL, no wheezes, normal WOB Ext: No edema, warm Neuro: Alert and oriented MSK: Tender to palpation over greater trochanter bilaterally  Assessment & Plan:  [name] was seen today for hip injection.  Diagnoses and all orders for this visit:  Allergic rhinitis due to other allergic trigger, unspecified seasonality Stop loratadine, start below Nasal steroid daily Discussed symptomatic care -     levocetirizine (XYZAL) 5 MG tablet; Take 1 tablet (5 mg total) by mouth every evening.  Trochanteric  bursitis of both hips Discussed options, patient wants to proceed with bursa injections bilateral hips -     triamcinolone acetonide (KENALOG-40) injection 40 mg -     triamcinolone acetonide (KENALOG-40) injection 40 mg  Trochanteric bursa injection Verbal consent was obtained from the patient. Risks including infection, bleeding explained and contrasted with benefits and alternatives. Patient prepped with Betadine.  Point of maximal tenderness around over first left and then right trochanter, both trochanteric bursas were injected. The patient tolerated the procedure well. No complications.  Injection: 66mL bupivacaine and  Kenalog 40 mg each injection Needle: 25 gauge   Follow up plan: As needed 3m, MD Rex Kras [facility]

## 2017-03-06 ENCOUNTER — Telehealth: Payer: Self-pay | Admitting: Nurse Practitioner

## 2017-03-07 ENCOUNTER — Encounter: Payer: Self-pay | Admitting: Pediatrics

## 2017-03-07 MED ORDER — TRIAMCINOLONE ACETONIDE 40 MG/ML IJ SUSP: 40.0000 mg | Freq: Once | INTRAMUSCULAR | Status: DC

## 2017-03-12 ENCOUNTER — Telehealth: Payer: Self-pay

## 2017-03-12 ENCOUNTER — Encounter: Payer: Self-pay | Admitting: Nurse Practitioner

## 2017-03-12 ENCOUNTER — Telehealth: Payer: Self-pay | Admitting: Nurse Practitioner

## 2017-03-12 ENCOUNTER — Ambulatory Visit (INDEPENDENT_AMBULATORY_CARE_PROVIDER_SITE_OTHER): Payer: Medicare HMO | Admitting: Nurse Practitioner

## 2017-03-12 VITALS — BP 130/80 | HR 55 | Temp 98.0°F | Ht 62.0 in | Wt 142.0 lb

## 2017-03-12 DIAGNOSIS — I10 Essential (primary) hypertension: Secondary | ICD-10-CM | POA: Diagnosis not present

## 2017-03-12 DIAGNOSIS — M47812 Spondylosis without myelopathy or radiculopathy, cervical region: Secondary | ICD-10-CM | POA: Diagnosis not present

## 2017-03-12 DIAGNOSIS — M47817 Spondylosis without myelopathy or radiculopathy, lumbosacral region: Secondary | ICD-10-CM | POA: Diagnosis not present

## 2017-03-12 MED ORDER — HYDROCODONE-ACETAMINOPHEN 7.5-325 MG PO TABS: 1.0000 | ORAL_TABLET | Freq: Four times a day (QID) | ORAL | 0 refills | Status: DC | PRN

## 2017-03-12 MED ORDER — HYDROCODONE-ACETAMINOPHEN 7.5-325 MG PO TABS: 1.0000 | ORAL_TABLET | Freq: Two times a day (BID) | ORAL | 0 refills | Status: DC

## 2017-03-12 MED ORDER — TIZANIDINE HCL 2 MG PO TABS: 4.0000 mg | ORAL_TABLET | Freq: Four times a day (QID) | ORAL | Status: DC | PRN

## 2017-03-12 NOTE — Telephone Encounter (Signed)
Patient talked to triage °

## 2017-03-12 NOTE — Patient Instructions (Signed)
Supraventricular Tachycardia, Adult °Supraventricular tachycardia (SVT) is a kind of abnormal heartbeat. It makes your heart beat very fast and then beat at a normal speed. °A normal heart beats 60-100 times a minute. This condition can make your heart beat more than 150 times a minute. Times of having a fast heartbeat (episodes) can be scary, but they are usually not dangerous. They can lead to problems if: °· They happen often. °· They last a long time. °Symptoms of this condition include: °· A pounding heart. °· A feeling that your heart is skipping beats (palpitations). °· Weakness. °· Trouble getting enough air (shortness of breath). °· Pain or tightness in your chest. °· Feeling like you are going to pass out (light-headedness). °· Feeling worried or nervous (anxiety). °· Dizziness. °· Sweating. °· Feeling sick to your stomach (nausea). °· Passing out (fainting). °· Tiredness. °Sometimes, there are no symptoms. °Follow these instructions at home: °Stress  °· Avoid things that make you feel stressed. °· Find out what helps you feel less stressed. Try: °¨ Doing a relaxing activity, like yoga, meditation, or being out in nature. °¨ Listening to relaxing music. °¨ Doing relaxation techniques, like deep breathing. °¨ Taking steps to be healthy. These include getting lots of sleep, exercising, and eating a balanced diet. °¨ Talking with a mental health doctor. °Sleep  °· Try to get at least 7 hours of sleep each night. °Tobacco and nicotine  °· Do not use anything that has nicotine or tobacco, such as cigarettes and e-cigarettes. If you need help quitting, ask your doctor. °Alcohol  °· If alcohol gives you a fast heartbeat, do not drink alcohol. °· If alcohol does not seem to give you a fast heartbeat, limit your alcohol. For nonpregnant women, this means no more than 1 drink a day. For men, this means no more than 2 drinks a day. "One drink" means one of these: °¨ 12 oz of beer. °¨ 5 oz of wine. °¨ 1½ oz of hard  liquor. °Caffeine  °· If caffeine gives you a fast heartbeat, do not eat, drink, or use anything with caffeine in it. °· If caffeine does not seem to give you a fast heartbeat, limit how much caffeine you eat, drink, or use. °Stimulant drugs  °· Do not use stimulant drugs. These are drugs like cocaine or methamphetamine. If you need help quitting, ask your doctor. °General instructions  °· Stay at a healthy weight. °· Exercise regularly. Ask your doctor to suggest some good activities for you. Try one of these options: °¨ 150 minutes a week of gentle exercise, like walking or yoga. °¨ 75 minutes a week of exercise that is very active, like running or swimming. °¨ A combination of gentle exercise and very active exercise. °· Do home treatments to slow down your heartbeat as told by your doctor. °· Take over-the-counter and prescription medicines only as told by your doctor. °Contact a doctor if: °· You have a fast heartbeat more often. °· Times of having a fast heartbeat last longer than before. °· Your home treatments to slow down your heartbeat do not help. °· You have new symptoms. °Get help right away if: °· You have chest pain. °· Your symptoms get worse. °· You have trouble breathing. °· Your heart beats very fast for more than 20 minutes. °· You pass out (faint). °These symptoms may be an emergency. Do not wait to see if the symptoms will go away. Get medical help right away. Call your   local emergency services (911 in the [place] Do not drive yourself to the hospital. °This information is not intended to replace advice given to you by your health care provider. Make sure you discuss any questions you have with your health care provider. °Document Released: 03/17/2005 Document Revised: 11/22/2015 Document Reviewed: 11/22/2015 °Elsevier Interactive Patient Education © 2017 [employer] ° °

## 2017-03-12 NOTE — Progress Notes (Signed)
Subjective:    Patient ID: [name], female    DOB: 1955/07/17, 61 y.o.   MRN: [id number]  HPI  Patient come sin for: - blood pressure has been elevated at home. She is currently not on any meds for her blood pressure. Blood pressure at home in 180's systolic. - pain  Management Robaxin not helping at all Pain assessment: Cause of pain- lumbar spondylosis Pain location- neck and  Lower back Pain on scale of 1-10- 5-9/10 Frequency- daily What increases pain-lots of movement What makes pain Better-pain meds help some but would like to increase dose Effects on ADL - some days she is not able to do anything Any change in general medical condition-has had pain in hips and has had steroids injections done by Dr. Oswaldo Done  Current medications- lortab 7.5/325 Bid Effectiveness of current meds- helps some but not working a swell as it use to Adverse reactions form pain meds- none Morphine equivalent 30  Pill count performed-No Urine drug screen- No Was the [facility] reviewed- yes  If yes were their any concerning findings? - none  Pain contract signed on: 12/17/16     Review of Systems  Constitutional: Negative for activity change and appetite change.  HENT: Negative.   Eyes: Negative for pain.  Respiratory: Negative for shortness of breath.   Cardiovascular: Negative for chest pain, palpitations and leg swelling.  Gastrointestinal: Negative for abdominal pain.  Endocrine: Negative for polydipsia.  Genitourinary: Negative.   Skin: Negative for rash.  Neurological: Negative for dizziness, weakness and headaches.  Hematological: Does not bruise/bleed easily.  Psychiatric/Behavioral: Negative.   All other systems reviewed and are negative.      Objective:   Physical Exam  Constitutional: She is oriented to person, place, and time. She appears well-developed and well-nourished.  HENT:  Nose: Nose normal.  Mouth/Throat: Oropharynx is clear and moist.  Eyes: EOM are  normal.  Neck: Trachea normal, normal range of motion and full passive range of motion without pain. Neck supple. No JVD present. Carotid bruit is not present. No thyromegaly present.  Cardiovascular: Normal rate, regular rhythm, normal heart sounds and intact distal pulses. Exam reveals no gallop and no friction rub.  No murmur heard. Pulmonary/Chest: Effort normal and breath sounds normal.  Abdominal: Soft. Bowel sounds are normal. She exhibits no distension and no mass. There is no tenderness.  Musculoskeletal: Normal range of motion.  Lymphadenopathy:    She has no cervical adenopathy.  Neurological: She is alert and oriented to person, place, and time. She has normal reflexes.  Skin: Skin is warm and dry.  Psychiatric: She has a normal mood and affect. Her behavior is normal. Judgment and thought content normal.    BP 130/80   Pulse (!) 55   Temp 98 F (36.7 C) (Oral)   Ht 5\' 2"  (1.575 m)   Wt 142 lb (64.4 kg)   BMI 25.97 kg/m        Assessment & Plan:  1. Lumbosacral spondylosis without myelopathy Cannot increase pain meds Changed robaxin to tizadine Meds ordered this encounter  Medications  . HYDROcodone-acetaminophen (NORCO) 7.5-325 MG tablet    Sig: Take 1 tablet by mouth every 6 (six) hours as needed for moderate pain.    Dispense:  30 tablet    Refill:  0    DO NOT FILL TILL 05/11/17    Order Specific Question:   Supervising Provider    Answer:   07/09/17 L [[id number]]  . tiZANidine (  ZANAFLEX) tablet 4 mg  . HYDROcodone-acetaminophen (NORCO) 7.5-325 MG tablet    Sig: Take 1 tablet by mouth 2 (two) times daily.    Dispense:  60 tablet    Refill:  0    Order Specific Question:   Supervising Provider    Answer:   VINCENT, CAROL L [[id number]]  . HYDROcodone-acetaminophen (NORCO) 7.5-325 MG tablet    Sig: Take 1 tablet by mouth 2 (two) times daily.    Dispense:  60 tablet    Refill:  0    DO NOT FILL TILL 04/11/17    Order Specific Question:   Supervising Provider      Answer:   Oswaldo Done, CAROL L [[id number]]      2. Essential hypertension Need to follow up with cardiology because blood pressure is high when heart rate is elevated and is ow when heart is in normal rhythym  3. Cervical spondylosis without myelopathy See above  [name] Daphine Deutscher, FNP

## 2017-03-12 NOTE — Telephone Encounter (Signed)
Patient contacted office stating her BP has been running 193/105,180/100. Patient states she has been very nauseated, vomiting and having an excruciating headache. Patient seen PCP today and states they told her to follow up with Dr. Wyline Mood ASAP. Patient states she can not continue with a BP this high. BP now is 130/80. Patient states she took a nitro and that is what made her BP go down. Patient does not want to see any other provider because Dr. Wyline Mood is the only one who understands her heart condition.

## 2017-03-13 ENCOUNTER — Telehealth: Payer: Self-pay | Admitting: Internal Medicine

## 2017-03-13 NOTE — Telephone Encounter (Signed)
Patient states she has had her gallbladder removed and that the nausea and vomiting is coming from BP. Informed patient again of what Dr. Wyline Mood stated and patient was very persistent regarding the N/V.Patient states BP this morning was 173/123. Patient states PCP told her it was stress related. Patient very concerned with these high blood pressures and feels she needs to be on a different medication.

## 2017-03-13 NOTE — Telephone Encounter (Signed)
High blood pressure can cause headaches but would not cause nausea or vomiting. Its more likely that the nausea and vomiting would be caused by something else, and would lead to the high blood pressure. Did her primary look into any causes for her nausea and vomiting, such as a gallbladder issue?   Dina Rich MD

## 2017-03-13 NOTE — Telephone Encounter (Signed)
Pt with increased blood pressures.  Pt has messaged Dr. Wyline Mood and Dr. Ladona Ridgel.  Notified Pt that Dr. Ladona Ridgel was off d/t death in family.  Notified Dr. Verna Czech nurse is making outreach to him for recommendation for increased blood pressures.  Notified Pt that this nurse could make appointment to see Dr. Ladona Ridgel if she would like.  Pt requested appt.  Appt made for January 9 @ 2:00 pm with Dr. Ladona Ridgel in [place].  Pt indicates understanding.  Thanked this nurse for call.  Will cont to monitor.

## 2017-03-13 NOTE — Telephone Encounter (Signed)
Pt c/o BP issue: STAT if pt c/o blurred vision, one-sided weakness or slurred speech  1. What are your last 5 BP readings? 193/105  2. Are you having any other symptoms (ex. Dizziness, headache, blurred vision, passed out)? Headaches, nausea  3. What is your BP issue? BP high and states that she went to her PCP and was told to f/u with Dr. Ladona Ridgel.

## 2017-03-16 NOTE — Telephone Encounter (Signed)
The first step to help with her bp's would be to cut back on either her flonrinef and midodrine. Vefrify she is taking florinef 0.2mg  daily, and verify how she is taking the midodrine. Also, how often is she taking lasix?   Dominga Ferry MD

## 2017-03-16 NOTE — Telephone Encounter (Signed)
Patient is taking florinef 0.2 mg and lasix 40 mg daily. Patient not taking midodrine

## 2017-03-17 ENCOUNTER — Ambulatory Visit: Payer: Medicare HMO | Admitting: Nurse Practitioner

## 2017-03-17 MED ORDER — FLUDROCORTISONE ACETATE 0.1 MG PO TABS: 0.1000 mg | ORAL_TABLET | Freq: Every day | ORAL | 1 refills | Status: DC

## 2017-03-17 NOTE — Telephone Encounter (Signed)
Patient notified and verbalized understanding. 

## 2017-03-17 NOTE — Telephone Encounter (Signed)
Dr. Wyline Mood managing medications.  No further action needed at this time.

## 2017-03-17 NOTE — Telephone Encounter (Signed)
Lower florinef back to 0.1mg  daily Lasix should be used just prn swelling, I would try not to take every day as it can work against florinef and could causes her passing episodes to reoccur, particularly if she becomes dehydrated due to the nausea and vomiting. Update [name] end of the week on her symptoms and bp's.    Dina Rich MD

## 2017-03-18 ENCOUNTER — Telehealth: Payer: Self-pay | Admitting: Nurse Practitioner

## 2017-03-18 MED ORDER — TIZANIDINE HCL 4 MG PO TABS: 4.0000 mg | ORAL_TABLET | Freq: Four times a day (QID) | ORAL | 0 refills | Status: DC | PRN

## 2017-03-18 NOTE — Telephone Encounter (Signed)
MMM put in tizanidine on 03/12/17 but instead of rx to pharmacy it was accidentally put in as clinic administered. Rx sent to pharmacy and pt is aware.

## 2017-03-26 NOTE — Telephone Encounter (Signed)
Several attempts have been made to patient and no call back- this encounter will be closed.

## 2017-03-30 ENCOUNTER — Other Ambulatory Visit: Payer: Self-pay | Admitting: Nurse Practitioner

## 2017-04-08 ENCOUNTER — Encounter: Payer: Self-pay | Admitting: Internal Medicine

## 2017-04-08 ENCOUNTER — Ambulatory Visit (INDEPENDENT_AMBULATORY_CARE_PROVIDER_SITE_OTHER): Payer: 59 | Admitting: Internal Medicine

## 2017-04-08 VITALS — BP 116/72 | HR 71 | Resp 16 | Ht 62.0 in | Wt 143.2 lb

## 2017-04-08 DIAGNOSIS — R55 Syncope and collapse: Secondary | ICD-10-CM

## 2017-04-08 DIAGNOSIS — I10 Essential (primary) hypertension: Secondary | ICD-10-CM

## 2017-04-08 MED ORDER — ONDANSETRON HCL 4 MG PO TABS: ORAL_TABLET | ORAL | 3 refills | Status: DC

## 2017-04-08 MED ORDER — CARVEDILOL 3.125 MG PO TABS: 3.1250 mg | ORAL_TABLET | Freq: Two times a day (BID) | ORAL | 3 refills | Status: DC

## 2017-04-08 NOTE — Progress Notes (Signed)
HPI [name] returns today for followup of her autonomic dysfunction. She has recently had problems with elevated blood pressures in the 170-190 range. She has a host of other complaints. Including back pain and leg pain. She is still smoking. She notes increased stress. Finally, she has had problems with nausea which are not related to her other symptoms but comes on with elevated blood pressures. Allergies  Allergen Reactions  . Codeine Other (See Comments)    "I will have a heart attack."  . Morphine And Related Other (See Comments)    "It will cause me to have a heart attack."  . Ambien [Zolpidem Tartrate] Nausea And Vomiting  . Lyrica [Pregabalin] Swelling and Other (See Comments)    Weight gain  . Neurontin [Gabapentin] Other (See Comments)    Causes elevated LFTs      Current Outpatient Medications  Medication Sig Dispense Refill  . ACCU-CHEK FASTCLIX LANCETS MISC Use to check blood sugar BID 306 each 0  . acyclovir ointment (ZOVIRAX) 5 % APPLY TO AFFECTED AREA EVERY 3 HOURS 30 g 4  . albuterol (PROVENTIL) (2.5 MG/3ML) 0.083% nebulizer solution USE 1 VIAL IN NEBULIZER EVERY 4 HOURS AS NEEDED FOR WHEEZING. 180 mL 2  . Alcohol Swabs (B-D SINGLE USE SWABS REGULAR) PADS Use to check BS twice daily 300 each 0  . Blood Glucose Calibration (ACCU-CHEK SMARTVIEW CONTROL) LIQD Use as directed 3 each 0  . busPIRone (BUSPAR) 10 MG tablet TAKE (1) TABLET TWICE DAILY. 60 tablet 2  . butalbital-acetaminophen-caffeine (FIORICET, ESGIC) 50-325-40 MG tablet TAKE 1 TABLET TWICE DAILY AS NEEDED FOR HEADACHE. 20 tablet 1  . dexlansoprazole (DEXILANT) 60 MG capsule Take 1 capsule (60 mg total) by mouth daily. 30 capsule 5  . diazepam (VALIUM) 5 MG tablet TAKE 1 TABLET EVERY 12 HOURS AS NEEDED FOR ANXIETY. 60 tablet 2  . DULoxetine (CYMBALTA) 60 MG capsule Take 1 capsule (60 mg total) by mouth at bedtime. 30 capsule 5  . escitalopram (LEXAPRO) 20 MG tablet TAKE (1) TABLET BY MOUTH ONCE  DAILY. 30 tablet 5  . estradiol (ESTRACE) 0.1 MG/GM vaginal cream PLACE 1 APPLICATORFUL VAGINALLY AT BEDTIME. 42.5 g 4  . fludrocortisone (FLORINEF) 0.1 MG tablet Take 1 tablet (0.1 mg total) by mouth daily. 60 tablet 1  . fluticasone furoate-vilanterol (BREO ELLIPTA) 200-25 MCG/INH AEPB Inhale 1 puff into the lungs daily. 60 each 5  . furosemide (LASIX) 40 MG tablet TAKE (1) TABLET BY MOUTH ONCE DAILY. 90 tablet 1  . glucose blood (ACCU-CHEK SMARTVIEW) test strip Use as instructed 100 each 12  . glucose blood (ONETOUCH VERIO) test strip Check blood sugar daily-- dx E11.9 100 each 11  . HYDROcodone-acetaminophen (NORCO) 7.5-325 MG tablet Take 1 tablet by mouth every 6 (six) hours as needed for moderate pain. 30 tablet 0  . ipratropium (ATROVENT) 0.02 % nebulizer solution USE 1 VIAL IN NEBULIZER EVERY 4 HOURS AS NEEDED FOR WHEEZING. 150 mL 2  . lamoTRIgine (LAMICTAL) 150 MG tablet Take 1 tablet (150 mg total) by mouth daily. 30 tablet 5  . levETIRAcetam (KEPPRA) 500 MG tablet TAKE (1) TABLET TWICE DAILY. 60 tablet 5  . levocetirizine (XYZAL) 5 MG tablet Take 1 tablet (5 mg total) by mouth every evening. 30 tablet 1  . Menthol, Topical Analgesic, (BIOFREEZE ROLL-ON) 4 % GEL Apply topically.    . miconazole (MICOTIN) 2 % cream Apply 1 application topically 2 (two) times daily. 28.35 g 0  . nitroGLYCERIN (NITROSTAT)  0.4 MG SL tablet PLACE ONE (1) TABLET UNDER TONGUE EVERY 5 MINUTES UP TO (3) DOSES AS NEEDED FOR CHEST PAIN. 25 tablet 0  . ONETOUCH DELICA LANCETS 33G MISC 1 daily to keep diary of blood sugars-- Dx E11.9 100 each 1  . oxybutynin (DITROPAN) 5 MG tablet TAKE (1) TABLET TWICE DAILY. 60 tablet 0  . ranitidine (ZANTAC) 150 MG tablet TAKE 1 TABLET AT BEDTIME. 30 tablet 5  . rosuvastatin (CRESTOR) 10 MG tablet TAKE (1) TABLET BY MOUTH ONCE DAILY. 30 tablet 5  . tiZANidine (ZANAFLEX) 4 MG tablet Take 1 tablet (4 mg total) by mouth every 6 (six) hours as needed for muscle spasms. 30 tablet 0  .  tolterodine (DETROL) 2 MG tablet TAKE (1) TABLET TWICE DAILY. 60 tablet 5  . traZODone (DESYREL) 150 MG tablet TAKE (1) TABLET DAILY AT BEDTIME. 30 tablet 2  . valACYclovir (VALTREX) 500 MG tablet TAKE (1) TABLET TWICE DAILY. 60 tablet 5  . VENTOLIN HFA 108 (90 Base) MCG/ACT inhaler INHALE 2 PUFFS EVERY 6 HOURS AS NEEDED FOR SHORTNESS OF BREATH AND WHEEZING. 18 g 0   Current Facility-Administered Medications  Medication Dose Route Frequency Provider Last Rate Last Dose  . tiZANidine (ZANAFLEX) tablet 4 mg  4 mg Oral Q6H PRN Daphine Deutscher, [name], FNP      . triamcinolone acetonide (KENALOG-40) injection 40 mg  40 mg Other Once Johna Sheriff, MD      . triamcinolone acetonide (KENALOG-40) injection 40 mg  40 mg Other Once Johna Sheriff, MD         Past Medical History:  Diagnosis Date  . Anginal pain (HCC)    last time   . Anxiety   . Arthritis    RHEUMATOID  . Asthma   . Bipolar 1 disorder (HCC)   . COPD (chronic obstructive pulmonary disease) (HCC)   . Coronary artery disease    reported hx of "MI";  Echo 2009 with normal LVF;  Myoview 05/2011: no ischemia  . Depression   . Dyslipidemia   . Esophageal stricture   . Fibromyalgia   . GERD (gastroesophageal reflux disease)   . H/O hiatal hernia   . Head injury, unspecified   . Herpes simplex infection   . History of kidney stones   . Hyperlipidemia   . Hypertension   . Insomnia   . Myocardial infarction (HCC)   . Osteoporosis   . Pneumonia    hx  . Seizures (HCC)    last 3 weeks ago- prescribed keppra -never took didnt like side effects read about online  . Shortness of breath   . Sleep apnea    ? neg  . Spinal stenosis of lumbar region   . Spondylolisthesis   . Status post placement of implantable loop recorder   . Supraventricular tachycardia (HCC)   . Syncope and collapse    s/p ILR; no arhythmogenic cause identified  . UTI (lower urinary tract infection)     ROS:   All systems reviewed and negative  except as noted in the HPI.   Past Surgical History:  Procedure Laterality Date  . BACK SURGERY    . BREAST SURGERY     lumpectomy  . CHOLECYSTECTOMY    . CYSTOSCOPY     stone  . DOPPLER ECHOCARDIOGRAPHY  2009  . head up tilt table testing  06/15/2007   [name]  . HEMORRHOID SURGERY    . insertion of implatable loop recorder  08/11/2007  [name]  . POSTERIOR CERVICAL FUSION/FORAMINOTOMY N/A 12/19/2013   Procedure: RIGHT C3-4.C4-5 AND C5-6 FORAMINOTOMIES;  Surgeon: Kerrin Champagne, MD;  Location: [facility] OR;  Service: Orthopedics;  Laterality: N/A;  . TUBAL LIGATION       Family History  Problem Relation Age of Onset  . Heart attack Father   . Cancer Father   . Mental illness Father   . Cancer Mother   . Mental illness Mother   . Heart attack Brother        stents  . Alcohol abuse Brother   . Heart disease Brother   . Drug abuse Brother   . Diabetes Brother   . Colon cancer Maternal Aunt   . Cirrhosis Brother   . Stomach cancer Neg Hx      Social History   Socioeconomic History  . Marital status: Single    Spouse name: Not on file  . Number of children: Not on file  . Years of education: Not on file  . Highest education level: Not on file  Social Needs  . Financial resource strain: Not on file  . Food insecurity - worry: Not on file  . Food insecurity - inability: Not on file  . Transportation needs - medical: Not on file  . Transportation needs - non-medical: Not on file  Occupational History  . Not on file  Tobacco Use  . Smoking status: Never Smoker  . Smokeless tobacco: Never Used  Substance and Sexual Activity  . Alcohol use: No    Alcohol/week: 0.0 oz  . Drug use: No  . Sexual activity: Yes  Other Topics Concern  . Not on file  Social History Narrative   Patient lives at home with her brother.   Patient is on disability.    Patient has 8th grade education.    Patient has 6 children, 20 grand-chilren, and 3 great-grand children.      BP  116/72   Pulse 71   Resp 16   Ht 5\' 2"  (1.575 m)   Wt 143 lb 3.2 oz (65 kg)   SpO2 96%   BMI 26.19 kg/m   Physical Exam:  Well appearing 62 yo woman, NAD HEENT: Unremarkable Neck:  6 cm JVD, no thyromegally Lymphatics:  No adenopathy Back:  No CVA tenderness Lungs:  Clear with no wheezes HEART:  Regular rate rhythm, no murmurs, no rubs, no clicks Abd:  soft, positive bowel sounds, no organomegally, no rebound, no guarding Ext:  2 plus pulses, no edema, no cyanosis, no clubbing Skin:  No rashes no nodules Neuro:  CN II through XII intact, motor grossly intact  EKG - sinus bradycardia  Assess/Plan: 1. HTN - her blood pressure is well controlled today. I have recommended that she start coreg 3.125 twice daily. She is instructed to hold this medication for blood pressures lower than 135.  2. Syncope - her spells are less frequent but still present.  3. Chest pain - she has non-cardiac pain. Will follow. She will continue her reflux meds. 4. Palpitations - these are mostly well controlled. Hopefully she will improve with beta blocker therapy.  Leonia Reeves.D.

## 2017-04-08 NOTE — Patient Instructions (Addendum)
Medication Instructions:  Your physician has recommended you make the following change in your medication:  1.  Start carvedilol.  Carvedilol 3.125 mg twice a day by mouth.  HOLD for systolic < 135. 2.  Start zofran.  Zofran 4 mg (take one to two tablets) by mouth daily for nausea.  Labwork: None ordered.  Testing/Procedures: None ordered.  Follow-Up: Your physician wants you to follow-up in: 3 months in [place].   Any Other Special Instructions Will Be Listed Below (If Applicable).  If you need a refill on your cardiac medications before your next appointment, please call your pharmacy.  Carvedilol tablets What is this medicine? CARVEDILOL (KAR ve dil ol) is a beta-blocker. Beta-blockers reduce the workload on the heart and help it to beat more regularly. This medicine is used to treat high blood pressure and heart failure. This medicine may be used for other purposes; ask your health care provider or pharmacist if you have questions. COMMON BRAND NAME(S): Coreg What should I tell my health care provider before I take this medicine? They need to know if you have any of these conditions: -circulation problems -diabetes -history of heart attack or heart disease -liver disease -lung or breathing disease, like asthma or emphysema -pheochromocytoma -slow or irregular heartbeat -thyroid disease -an unusual or allergic reaction to carvedilol, other beta-blockers, medicines, foods, dyes, or preservatives -pregnant or trying to get pregnant -breast-feeding How should I use this medicine? Take this medicine by mouth with a glass of water. Follow the directions on the prescription label. It is best to take the tablets with food. Take your doses at regular intervals. Do not take your medicine more often than directed. Do not stop taking except on the advice of your doctor or health care professional. Talk to your pediatrician regarding the use of this medicine in children. Special care  may be needed. Overdosage: If you think you have taken too much of this medicine contact a poison control center or emergency room at once. NOTE: This medicine is only for you. Do not share this medicine with others. What if I miss a dose? If you miss a dose, take it as soon as you can. If it is almost time for your next dose, take only that dose. Do not take double or extra doses. What may interact with this medicine? This medicine may interact with the following medications: -certain medicines for blood pressure, heart disease, irregular heart beat -certain medicines for depression, like fluoxetine or paroxetine -certain medicines for diabetes, like glipizide or glyburide -cimetidine -clonidine -cyclosporine -digoxin -MAOIs like Carbex, Eldepryl, Marplan, Nardil, and Parnate -reserpine -rifampin This list may not describe all possible interactions. Give your health care provider a list of all the medicines, herbs, non-prescription drugs, or dietary supplements you use. Also tell them if you smoke, drink alcohol, or use illegal drugs. Some items may interact with your medicine. What should I watch for while using this medicine? Check your heart rate and blood pressure regularly while you are taking this medicine. Ask your doctor or health care professional what your heart rate and blood pressure should be, and when you should contact him or her. Do not stop taking this medicine suddenly. This could lead to serious heart-related effects. Contact your doctor or health care professional if you have difficulty breathing while taking this drug. Check your weight daily. Ask your doctor or health care professional when you should notify him/her of any weight gain. You may get drowsy or dizzy. Do not drive,  use machinery, or do anything that requires mental alertness until you know how this medicine affects you. To reduce the risk of dizzy or fainting spells, do not sit or stand up quickly. Alcohol  can make you more drowsy, and increase flushing and rapid heartbeats. Avoid alcoholic drinks. If you have diabetes, check your blood sugar as directed. Tell your doctor if you have changes in your blood sugar while you are taking this medicine. If you are going to have surgery, tell your doctor or health care professional that you are taking this medicine. What side effects may I notice from receiving this medicine? Side effects that you should report to your doctor or health care professional as soon as possible: -allergic reactions like skin rash, itching or hives, swelling of the face, lips, or tongue -breathing problems -dark urine -irregular heartbeat -swollen legs or ankles -vomiting -yellowing of the eyes or skin Side effects that usually do not require medical attention (report to your doctor or health care professional if they continue or are bothersome): -change in sex drive or performance -diarrhea -dry eyes (especially if wearing contact lenses) -dry, itching skin -headache -nausea -unusually tired This list may not describe all possible side effects. Call your doctor for medical advice about side effects. You may report side effects to FDA at 1-800-FDA-1088. Where should I keep my medicine? Keep out of the reach of children. Store at room temperature below 30 degrees C (86 degrees F). Protect from moisture. Keep container tightly closed. Throw away any unused medicine after the expiration date. NOTE: This sheet is a summary. It may not cover all possible information. If you have questions about this medicine, talk to your doctor, pharmacist, or health care provider.  2018 [employer]/Gold Standard (2012-11-21 14:12:02)

## 2017-04-13 ENCOUNTER — Telehealth: Payer: Self-pay | Admitting: Internal Medicine

## 2017-04-13 DIAGNOSIS — I10 Essential (primary) hypertension: Secondary | ICD-10-CM

## 2017-04-13 MED ORDER — CARVEDILOL 3.125 MG PO TABS: 6.2500 mg | ORAL_TABLET | Freq: Two times a day (BID) | ORAL | 3 refills | Status: DC

## 2017-04-13 NOTE — Telephone Encounter (Signed)
Returned call to Pt.  Notified Pt she can increase her carvedilol 3.125 mg bid to 2 tabs bid.  Notified Pt not to increase more than that, per Dr. Ladona Ridgel medication needs time to work.  Pt indicates understanding.

## 2017-04-13 NOTE — Telephone Encounter (Signed)
Called pt to set up 6-8 week fu appt-pt states she started Carvedilol 3-125 mg BID, however she has been taking it TID and still has not gotten her BP done below 130. Yesterday it was 161, then she took a pill and checked it again , it had gone down to 151. This am 168/95 took a pill checked it again and it was down to 130. She is not sure if she just hasn't on it long enough, but wants to know if any med changes are needed? Appt 05-21-17 with Ladona Ridgel.

## 2017-04-16 ENCOUNTER — Ambulatory Visit (INDEPENDENT_AMBULATORY_CARE_PROVIDER_SITE_OTHER): Payer: 59

## 2017-04-16 ENCOUNTER — Telehealth: Payer: Self-pay | Admitting: Nurse Practitioner

## 2017-04-16 ENCOUNTER — Ambulatory Visit (INDEPENDENT_AMBULATORY_CARE_PROVIDER_SITE_OTHER): Payer: 59 | Admitting: Pediatrics

## 2017-04-16 ENCOUNTER — Encounter: Payer: Self-pay | Admitting: Pediatrics

## 2017-04-16 ENCOUNTER — Ambulatory Visit (INDEPENDENT_AMBULATORY_CARE_PROVIDER_SITE_OTHER): Payer: 59 | Admitting: Gastroenterology

## 2017-04-16 ENCOUNTER — Encounter: Payer: Self-pay | Admitting: Gastroenterology

## 2017-04-16 VITALS — BP 152/74 | HR 54 | Temp 97.2°F | Ht 62.0 in | Wt 148.2 lb

## 2017-04-16 VITALS — BP 148/82 | HR 53 | Ht 62.0 in | Wt 147.0 lb

## 2017-04-16 DIAGNOSIS — J42 Unspecified chronic bronchitis: Secondary | ICD-10-CM | POA: Diagnosis not present

## 2017-04-16 DIAGNOSIS — M25551 Pain in right hip: Secondary | ICD-10-CM | POA: Diagnosis not present

## 2017-04-16 DIAGNOSIS — K2100 Gastro-esophageal reflux disease with esophagitis, without bleeding: Secondary | ICD-10-CM

## 2017-04-16 DIAGNOSIS — Z780 Asymptomatic menopausal state: Secondary | ICD-10-CM

## 2017-04-16 DIAGNOSIS — M545 Low back pain: Secondary | ICD-10-CM | POA: Diagnosis not present

## 2017-04-16 DIAGNOSIS — G8929 Other chronic pain: Secondary | ICD-10-CM

## 2017-04-16 DIAGNOSIS — R1013 Epigastric pain: Secondary | ICD-10-CM

## 2017-04-16 MED ORDER — SUCRALFATE 1 GM/10ML PO SUSP
1.0000 g | Freq: Two times a day (BID) | ORAL | 1 refills | Status: DC
Start: 2017-04-16 — End: 2017-06-19

## 2017-04-16 NOTE — Progress Notes (Signed)
[facility] Consult Note:  History: [name] 04/16/2017  Referring physician: Bennie Pierini, FNP  Reason for consult/chief complaint: Gastroesophageal Reflux (pt reports epigastric pain, often regurgitates acid, feels burning in her throat; PPI and Zantac not relieving symptoms) and Diarrhea (pt states she has loose stools that feel like they contain acid and burns her skin)   Subjective  HPI:  This is a 62 year old woman referred by primary care noted above for evaluation of reflux.  She has had many years of symptoms and prior evaluation by Dr. Arlyce Dice, most recently July 2015.  Patient is here with her daughter today, who helps with the history because [name] was not feeling well after having 1 of her "episodes" in the waiting room.  She says this happens quite frequently and she gets lightheaded and needs to sit down because she might pass out.  I see that she has had extensive evaluation for this and a reported diagnosis of autonomic dysfunction.  I reviewed the January 9 cardiology note about this.  She describes intractable nightly heartburn where she has regurgitation of liquid stomach contents and she feels that her epigastrium her entire chest and throat are "on fire" she can get absolutely no relief.  She says she has never been a smoker, she has to sit upright at night but still gets no relief from the symptoms, and does not eat within several hours of bed. She has a multitude of other symptoms including a variety of musculoskeletal complaints, chronic dyspnea, anxiety, and the autonomic dysfunction symptoms as described above. Her last upper endoscopy with Dr. Arlyce Dice in 2015 revealed a small hiatal hernia and a reported stricture, though it is difficult to tell that with certainty from the photographs.  A Maloney dilation was performed, the patient cannot recall whether it improved her dysphagia.  She is not describing dysphagia at this  point.  ROS:  Remainder systems are negative except as described above  Past Medical History: Past Medical History:  Diagnosis Date  . Anginal pain (HCC)    last time   . Anxiety   . Arthritis    RHEUMATOID  . Asthma   . Bipolar 1 disorder (HCC)   . COPD (chronic obstructive pulmonary disease) (HCC)   . Coronary artery disease    reported hx of "MI";  Echo 2009 with normal LVF;  Myoview 05/2011: no ischemia  . Depression   . Dyslipidemia   . Esophageal stricture   . Fibromyalgia   . GERD (gastroesophageal reflux disease)   . H/O hiatal hernia   . Head injury, unspecified   . Herpes simplex infection   . History of kidney stones   . Hyperlipidemia   . Hypertension   . Insomnia   . Myocardial infarction (HCC)   . Osteoporosis   . Pneumonia    hx  . Seizures (HCC)    last 3 weeks ago- prescribed keppra -never took didnt like side effects read about online  . Shortness of breath   . Sleep apnea    ? neg  . Spinal stenosis of lumbar region   . Spondylolisthesis   . Status post placement of implantable loop recorder   . Supraventricular tachycardia (HCC)   . Syncope and collapse    s/p ILR; no arhythmogenic cause identified  . UTI (lower urinary tract infection)      Past Surgical History: Past Surgical History:  Procedure Laterality Date  . BACK SURGERY    . BREAST SURGERY  lumpectomy  . CHOLECYSTECTOMY    . CYSTOSCOPY     stone  . DOPPLER ECHOCARDIOGRAPHY  2009  . head up tilt table testing  06/15/2007   [name]  . HEMORRHOID SURGERY    . insertion of implatable loop recorder  08/11/2007   [name]  . POSTERIOR CERVICAL FUSION/FORAMINOTOMY N/A 12/19/2013   Procedure: RIGHT C3-4.C4-5 AND C5-6 FORAMINOTOMIES;  Surgeon: Kerrin Champagne, MD;  Location: [facility] OR;  Service: Orthopedics;  Laterality: N/A;  . TUBAL LIGATION       Family History: Family History  Problem Relation Age of Onset  . Heart attack Father   . Cancer Father   . Mental illness  Father   . Cancer Mother   . Mental illness Mother   . Heart attack Brother        stents  . Alcohol abuse Brother   . Heart disease Brother   . Drug abuse Brother   . Diabetes Brother   . Colon cancer Maternal Aunt   . Cirrhosis Brother   . Stomach cancer Neg Hx     Social History: Social History   Socioeconomic History  . Marital status: Single    Spouse name: None  . Number of children: None  . Years of education: None  . Highest education level: None  Social Needs  . Financial resource strain: None  . Food insecurity - worry: None  . Food insecurity - inability: None  . Transportation needs - medical: None  . Transportation needs - non-medical: None  Occupational History  . None  Tobacco Use  . Smoking status: Never Smoker  . Smokeless tobacco: Never Used  Substance and Sexual Activity  . Alcohol use: No    Alcohol/week: 0.0 oz  . Drug use: No  . Sexual activity: Yes  Other Topics Concern  . None  Social History Narrative   Patient lives at home with her brother.   Patient is on disability.    Patient has 8th grade education.    Patient has 6 children, 20 grand-chilren, and 3 great-grand children.     Allergies: Allergies  Allergen Reactions  . Codeine Other (See Comments)    "I will have a heart attack."  . Morphine And Related Other (See Comments)    "It will cause me to have a heart attack."  . Ambien [Zolpidem Tartrate] Nausea And Vomiting  . Lyrica [Pregabalin] Swelling and Other (See Comments)    Weight gain  . Neurontin [Gabapentin] Other (See Comments)    Causes elevated LFTs     Outpatient Meds: Current Outpatient Medications  Medication Sig Dispense Refill  . ACCU-CHEK FASTCLIX LANCETS MISC Use to check blood sugar BID 306 each 0  . acyclovir ointment (ZOVIRAX) 5 % APPLY TO AFFECTED AREA EVERY 3 HOURS 30 g 4  . albuterol (PROVENTIL) (2.5 MG/3ML) 0.083% nebulizer solution USE 1 VIAL IN NEBULIZER EVERY 4 HOURS AS NEEDED FOR WHEEZING. 180  mL 2  . Alcohol Swabs (B-D SINGLE USE SWABS REGULAR) PADS Use to check BS twice daily 300 each 0  . Blood Glucose Calibration (ACCU-CHEK SMARTVIEW CONTROL) LIQD Use as directed 3 each 0  . busPIRone (BUSPAR) 10 MG tablet TAKE (1) TABLET TWICE DAILY. 60 tablet 2  . butalbital-acetaminophen-caffeine (FIORICET, ESGIC) 50-325-40 MG tablet TAKE 1 TABLET TWICE DAILY AS NEEDED FOR HEADACHE. 20 tablet 1  . carvedilol (COREG) 3.125 MG tablet Take 2 tablets (6.25 mg total) by mouth 2 (two) times daily. (Patient taking differently: Take  6.25 mg by mouth 3 (three) times daily. ) 360 tablet 3  . dexlansoprazole (DEXILANT) 60 MG capsule Take 1 capsule (60 mg total) by mouth daily. 30 capsule 5  . diazepam (VALIUM) 5 MG tablet TAKE 1 TABLET EVERY 12 HOURS AS NEEDED FOR ANXIETY. 60 tablet 2  . DULoxetine (CYMBALTA) 60 MG capsule Take 1 capsule (60 mg total) by mouth at bedtime. 30 capsule 5  . escitalopram (LEXAPRO) 20 MG tablet TAKE (1) TABLET BY MOUTH ONCE DAILY. 30 tablet 5  . estradiol (ESTRACE) 0.1 MG/GM vaginal cream PLACE 1 APPLICATORFUL VAGINALLY AT BEDTIME. 42.5 g 4  . fludrocortisone (FLORINEF) 0.1 MG tablet Take 1 tablet (0.1 mg total) by mouth daily. 60 tablet 1  . fluticasone furoate-vilanterol (BREO ELLIPTA) 200-25 MCG/INH AEPB Inhale 1 puff into the lungs daily. 60 each 5  . furosemide (LASIX) 40 MG tablet TAKE (1) TABLET BY MOUTH ONCE DAILY. 90 tablet 1  . glucose blood (ACCU-CHEK SMARTVIEW) test strip Use as instructed 100 each 12  . glucose blood (ONETOUCH VERIO) test strip Check blood sugar daily-- dx E11.9 100 each 11  . HYDROcodone-acetaminophen (NORCO) 7.5-325 MG tablet Take 1 tablet by mouth every 6 (six) hours as needed for moderate pain. 30 tablet 0  . ipratropium (ATROVENT) 0.02 % nebulizer solution USE 1 VIAL IN NEBULIZER EVERY 4 HOURS AS NEEDED FOR WHEEZING. 150 mL 2  . lamoTRIgine (LAMICTAL) 150 MG tablet Take 1 tablet (150 mg total) by mouth daily. 30 tablet 5  . levETIRAcetam  (KEPPRA) 500 MG tablet TAKE (1) TABLET TWICE DAILY. 60 tablet 5  . levocetirizine (XYZAL) 5 MG tablet Take 1 tablet (5 mg total) by mouth every evening. 30 tablet 1  . Menthol, Topical Analgesic, (BIOFREEZE ROLL-ON) 4 % GEL Apply topically.    . miconazole (MICOTIN) 2 % cream Apply 1 application topically 2 (two) times daily. 28.35 g 0  . nitroGLYCERIN (NITROSTAT) 0.4 MG SL tablet PLACE ONE (1) TABLET UNDER TONGUE EVERY 5 MINUTES UP TO (3) DOSES AS NEEDED FOR CHEST PAIN. 25 tablet 0  . ondansetron (ZOFRAN) 4 MG tablet Take one to two tablets daily as needed for nausea. 60 tablet 3  . ONETOUCH DELICA LANCETS 33G MISC 1 daily to keep diary of blood sugars-- Dx E11.9 100 each 1  . oxybutynin (DITROPAN) 5 MG tablet TAKE (1) TABLET TWICE DAILY. 60 tablet 0  . ranitidine (ZANTAC) 150 MG tablet TAKE 1 TABLET AT BEDTIME. 30 tablet 5  . rosuvastatin (CRESTOR) 10 MG tablet TAKE (1) TABLET BY MOUTH ONCE DAILY. 30 tablet 5  . tiZANidine (ZANAFLEX) 4 MG tablet Take 1 tablet (4 mg total) by mouth every 6 (six) hours as needed for muscle spasms. 30 tablet 0  . tolterodine (DETROL) 2 MG tablet TAKE (1) TABLET TWICE DAILY. 60 tablet 5  . traZODone (DESYREL) 150 MG tablet TAKE (1) TABLET DAILY AT BEDTIME. 30 tablet 2  . valACYclovir (VALTREX) 500 MG tablet TAKE (1) TABLET TWICE DAILY. 60 tablet 5  . VENTOLIN HFA 108 (90 Base) MCG/ACT inhaler INHALE 2 PUFFS EVERY 6 HOURS AS NEEDED FOR SHORTNESS OF BREATH AND WHEEZING. 18 g 0  . sucralfate (CARAFATE) 1 GM/10ML suspension Take 10 mLs (1 g total) by mouth 2 (two) times daily. 420 mL 1   Current Facility-Administered Medications  Medication Dose Route Frequency Provider Last Rate Last Dose  . tiZANidine (ZANAFLEX) tablet 4 mg  4 mg Oral Q6H PRN Daphine Deutscher, [name], FNP      . triamcinolone  acetonide (KENALOG-40) injection 40 mg  40 mg Other Once Johna Sheriff, MD      . triamcinolone acetonide (KENALOG-40) injection 40 mg  40 mg Other Once Johna Sheriff, MD           ___________________________________________________________________ Objective   Exam:  BP (!) 148/82   Pulse (!) 53   Ht 5\' 2"  (1.575 m)   Wt 147 lb (66.7 kg)   BMI 26.89 kg/m    General: this is a(n) chronically ill-appearing woman sitting in a wheelchair, which limits her evaluation.  She says she was not feeling well enough to get on the exam table.  She has a generally anxious affect and spent much of the visit crying.  Eyes: sclera anicteric, no redness  ENT: oral mucosa moist without lesions, no cervical or supraclavicular lymphadenopathy  CV: RRR without murmur, S1/S2, no JVD, no peripheral edema  Resp: Globally decreased breath sounds bilaterally, normal RR and effort noted  GI: soft, no tenderness, with active bowel sounds. No guarding or palpable organomegaly noted.  Skin; warm and dry, no rash or jaundice noted   Labs:  CBC Latest Ref Rng & Units 12/12/2016 10/14/2016 12/12/2013  WBC 4.0 - 10.5 K/uL 6.1 CANCELED 12.7(H)  Hemoglobin 12.0 - 15.0 g/dL 16.1 CANCELED 09.6  Hematocrit 36.0 - 46.0 % 43.4 CANCELED 40.1  Platelets 150 - 400 K/uL 181 CANCELED 219    CMP Latest Ref Rng & Units 01/19/2017 12/12/2016 10/14/2016  Glucose 65 - 99 mg/dL 045(W) 098(J) 84  BUN 8 - 27 mg/dL 18 14 11   Creatinine 0.57 - 1.00 mg/dL 1.91(Y) 7.82 9.56  Sodium 134 - 144 mmol/L 148(H) 140 142  Potassium 3.5 - 5.2 mmol/L 4.7 4.6 4.7  Chloride 96 - 106 mmol/L 102 105 101  CO2 20 - 29 mmol/L 26 26 22   Calcium 8.7 - 10.3 mg/dL 21.3 9.7 08.6  Total Protein 6.0 - 8.5 g/dL 7.2 7.7 7.9  Total Bilirubin 0.0 - 1.2 mg/dL 0.3 5.7(Q) 0.2  Alkaline Phos 39 - 117 IU/L 97 99 123(H)  AST 0 - 40 IU/L 12 23 26   ALT 0 - 32 IU/L 9 31 25     Assessment: Encounter Diagnoses  Name Primary?  . Gastroesophageal reflux disease with esophagitis Yes  . Abdominal pain, chronic, epigastric     This patient has nonulcer dyspepsia and GERD exacerbated by hiatal hernia, but I think there is a  significant functional component to the symptoms as well as polypharmacy.  I tried to describe to her and her daughter the concept of nonacid reflux, but at that point the patient really was not paying attention to me and did not seem to accept my explanation.  She wanted to know why she cannot increase the dose or frequency of PPI, and I explained to her it will not be helpful since she is already on potent antacid therapy.  In addition, there was apparently no improvement when she was on twice daily Dexilant under Dr. Marzetta Board care.  She is a poor candidate for prokinetic therapy such as metoclopramide given her polypharmacy, and I think she is a poor surgical candidate for fundoplication. She has secondhand smoke exposure but does not not use tobacco herself, she is reportedly good about not eating within several hours of bed, and she sleeps sitting up.  Nevertheless, she feels that this reflux is "burning a hole in my throat".  Plan:  I think all I am able to do is try for some symptom  relief with twice daily dose of Carafate.  She seems unsatisfied with that evaluation suggestion, but her daughter convinced her to give it a try.  They also asked if there was benefit in doing a repeat upper endoscopy, but I am  doubtful that will be any more revealing than it was before for the symptoms.   Thank you for the courtesy of this consult.  Please call me with any questions or concerns.  Charlie Pitter III  CC: Bennie Pierini, FNP

## 2017-04-16 NOTE — Progress Notes (Signed)
Subjective:   Patient ID: [name], female    DOB: Feb 05, 1956, 62 y.o.   MRN: [id number] CC: Hip Pain (Bilateral, 3 weeks)  HPI: [name] is a 62 y.o. female presenting for Hip Pain (Bilateral, 3 weeks)  Here today with her daughter Has a history of greater trochanteric bursitis but is here today because of pain more in her upper buttock that wraps around onto the side of her hips bilaterally Right side is worse than the left Also points to her tailbone, says the pain sometimes starts there Has had pain with sitting, can't find comfortable position Getting up to a standing position hurts, weightbearing on either leg does not make the pain worse. Follows with her PCP at this office for pain management, takes hydrocodone as needed, prescribed #30 tabs a month.   She has had two falls against walls in the last few weeks per pt and her daughter She has been seen by cardiology for syncope She repeatedly says today that she has had fractures in her hips and in her pelvis though I can find no record of this in her chart or in prior imaging.  She has had surgery in her lumbar spine, has hardware there.   She had a DEXA scan in 2013 but I am not able to see the results.  She has not had a DEXA scan since then.  In the past she has been seen by PMR and orthopedics for ongoing back pain and neck pain and received injections that she says was helpful at times.  Says she has been coughing some past few days, non-productive, has exposure to cig smoke regularly though is not a smoker Using albuterol at times, takes breo daily, she says she thinks lung function has been much improved since starting that  Relevant past medical, surgical, family and social history reviewed. Allergies and medications reviewed and updated. Social History   Tobacco Use  Smoking Status Never Smoker  Smokeless Tobacco Never Used   ROS: Per HPI   Objective:    BP (!) 152/74   Pulse (!) 54   Temp  (!) 97.2 F (36.2 C) (Oral)   Ht 5\' 2"  (1.575 m)   Wt 148 lb 3.2 oz (67.2 kg)   BMI 27.11 kg/m   Wt Readings from Last 3 Encounters:  04/16/17 148 lb 3.2 oz (67.2 kg)  04/16/17 147 lb (66.7 kg)  04/08/17 143 lb 3.2 oz (65 kg)    Gen: NAD, alert, frail, appears older than stated age, needs help getting onto exam table, cooperative with exam, NCAT, ambulates with walker, tearful at times EYES: EOMI, no conjunctival injection, or no icterus LYMPH: no cervical LAD CV: NRRR, normal S1/S2, no murmur Resp: CTABL, no wheezes, normal WOB Abd: +BS, soft, NTND. no guarding or organomegaly Ext: No edema, warm Neuro: Alert and oriented MSK: stable pelvis, pain in buttocks with int and external ROM of her hips b/l, some pain SI joint R side, tender along spine from mid spine to coccyx  Assessment & Plan:  [name] was seen today for hip pain.  Diagnoses and all orders for this visit:  Post-menopausal Multiple falls Will evaluate for osteoporosis, not currently on treatment Patient does not think she is ever been on a bisphosphonate in the past -     DG [facility] DEXA  Right hip pain Chronic midline low back pain without sciatica We will check x-ray of lumbar spine for compression fracture, right hip xray Tender primarily in  soft tissue of her upper right buttock Offered physical therapy, patient declined Currently on Valium, limited further pain management options given CKD and polypharmacy -     DG Lumbar Spine 2-3 Views; Future -     DG HIP UNILAT W OR W/O PELVIS 2-3 VIEWS RIGHT; Future -     Ambulatory referral to Physical Medicine Rehab  Chronic bronchitis, unspecified chronic bronchitis type (HCC) Normal lung exam, continue Breo, albuterol as needed  Follow up plan: As scheduled  Rex Kras, MD [facility]

## 2017-04-16 NOTE — Patient Instructions (Signed)
If you are age 61 or older, your body mass index should be between 23-30. Your Body mass index is 26.89 kg/m. If this is out of the aforementioned range listed, please consider follow up with your Primary Care Provider.  If you are age 12 or younger, your body mass index should be between 19-25. Your Body mass index is 26.89 kg/m. If this is out of the aformentioned range listed, please consider follow up with your Primary Care Provider.   We have sent the following medications to your pharmacy for you to pick up at your convenience: Carafate  Thank you for choosing [facility]  Dr Amada Jupiter III

## 2017-04-21 ENCOUNTER — Ambulatory Visit (INDEPENDENT_AMBULATORY_CARE_PROVIDER_SITE_OTHER): Payer: 59 | Admitting: Nurse Practitioner

## 2017-04-21 ENCOUNTER — Encounter: Payer: Self-pay | Admitting: Nurse Practitioner

## 2017-04-21 VITALS — BP 121/73 | HR 78 | Temp 98.2°F | Ht 62.0 in | Wt 145.0 lb

## 2017-04-21 DIAGNOSIS — R569 Unspecified convulsions: Secondary | ICD-10-CM

## 2017-04-21 DIAGNOSIS — G44229 Chronic tension-type headache, not intractable: Secondary | ICD-10-CM

## 2017-04-21 DIAGNOSIS — M5412 Radiculopathy, cervical region: Secondary | ICD-10-CM

## 2017-04-21 DIAGNOSIS — R55 Syncope and collapse: Secondary | ICD-10-CM | POA: Diagnosis not present

## 2017-04-21 DIAGNOSIS — M4802 Spinal stenosis, cervical region: Secondary | ICD-10-CM

## 2017-04-21 DIAGNOSIS — R300 Dysuria: Secondary | ICD-10-CM

## 2017-04-21 DIAGNOSIS — E785 Hyperlipidemia, unspecified: Secondary | ICD-10-CM

## 2017-04-21 DIAGNOSIS — M9981 Other biomechanical lesions of cervical region: Secondary | ICD-10-CM

## 2017-04-21 DIAGNOSIS — B009 Herpesviral infection, unspecified: Secondary | ICD-10-CM

## 2017-04-21 DIAGNOSIS — F5101 Primary insomnia: Secondary | ICD-10-CM

## 2017-04-21 DIAGNOSIS — M47812 Spondylosis without myelopathy or radiculopathy, cervical region: Secondary | ICD-10-CM

## 2017-04-21 DIAGNOSIS — K2100 Gastro-esophageal reflux disease with esophagitis, without bleeding: Secondary | ICD-10-CM

## 2017-04-21 DIAGNOSIS — I10 Essential (primary) hypertension: Secondary | ICD-10-CM | POA: Diagnosis not present

## 2017-04-21 DIAGNOSIS — F411 Generalized anxiety disorder: Secondary | ICD-10-CM | POA: Diagnosis not present

## 2017-04-21 DIAGNOSIS — F3341 Major depressive disorder, recurrent, in partial remission: Secondary | ICD-10-CM

## 2017-04-21 DIAGNOSIS — M48061 Spinal stenosis, lumbar region without neurogenic claudication: Secondary | ICD-10-CM

## 2017-04-21 DIAGNOSIS — R7303 Prediabetes: Secondary | ICD-10-CM

## 2017-04-21 LAB — MICROSCOPIC EXAMINATION
Epithelial Cells (non renal): >10 /hpf — AB (ref 0–10)
RENAL EPITHEL UA: NONE SEEN /HPF

## 2017-04-21 LAB — URINALYSIS, COMPLETE
BILIRUBIN UA: NEGATIVE
Glucose, UA: NEGATIVE
KETONES UA: NEGATIVE
NITRITE UA: NEGATIVE
Protein, UA: NEGATIVE
RBC UA: NEGATIVE
SPEC GRAV UA: 1.025 (ref 1.005–1.030)
UUROB: 0.2 mg/dL (ref 0.2–1.0)
pH, UA: 6.5 (ref 5.0–7.5)

## 2017-04-21 LAB — HM DIABETES EYE EXAM

## 2017-04-21 MED ORDER — DIAZEPAM 5 MG PO TABS: ORAL_TABLET | ORAL | 2 refills | Status: DC

## 2017-04-21 MED ORDER — BUTALBITAL-APAP-CAFFEINE 50-325-40 MG PO TABS: ORAL_TABLET | ORAL | 1 refills | Status: DC

## 2017-04-21 MED ORDER — FLUTICASONE PROPIONATE 50 MCG/ACT NA SUSP: 2.0000 | Freq: Every day | NASAL | 6 refills | Status: DC

## 2017-04-21 NOTE — Progress Notes (Signed)
Subjective:    Patient ID: [name], female    DOB: [birth year], 62 y.o.   MRN: [id number]  HPI   [name] is here today for follow up of chronic medical problem.  Outpatient Encounter Medications as of 04/21/2017  Medication Sig  . ACCU-CHEK FASTCLIX LANCETS MISC Use to check blood sugar BID  . acyclovir ointment (ZOVIRAX) 5 % APPLY TO AFFECTED AREA EVERY 3 HOURS  . albuterol (PROVENTIL) (2.5 MG/3ML) 0.083% nebulizer solution USE 1 VIAL IN NEBULIZER EVERY 4 HOURS AS NEEDED FOR WHEEZING.  [name] Alcohol Swabs (B-D SINGLE USE SWABS REGULAR) PADS Use to check BS twice daily  . Blood Glucose Calibration (ACCU-CHEK SMARTVIEW CONTROL) LIQD Use as directed  . busPIRone (BUSPAR) 10 MG tablet TAKE (1) TABLET TWICE DAILY.  . butalbital-acetaminophen-caffeine (FIORICET, ESGIC) 50-325-40 MG tablet TAKE 1 TABLET TWICE DAILY AS NEEDED FOR HEADACHE.  . carvedilol (COREG) 3.125 MG tablet Take 2 tablets (6.25 mg total) by mouth 2 (two) times daily. (Patient taking differently: Take 6.25 mg by mouth 3 (three) times daily. )  . dexlansoprazole (DEXILANT) 60 MG capsule Take 1 capsule (60 mg total) by mouth daily.  . diazepam (VALIUM) 5 MG tablet TAKE 1 TABLET EVERY 12 HOURS AS NEEDED FOR ANXIETY.  . DULoxetine (CYMBALTA) 60 MG capsule Take 1 capsule (60 mg total) by mouth at bedtime.  [name] escitalopram (LEXAPRO) 20 MG tablet TAKE (1) TABLET BY MOUTH ONCE DAILY.  [name] estradiol (ESTRACE) 0.1 MG/GM vaginal cream PLACE 1 APPLICATORFUL VAGINALLY AT BEDTIME.  . fludrocortisone (FLORINEF) 0.1 MG tablet Take 1 tablet (0.1 mg total) by mouth daily.  . fluticasone furoate-vilanterol (BREO ELLIPTA) 200-25 MCG/INH AEPB Inhale 1 puff into the lungs daily.  . furosemide (LASIX) 40 MG tablet TAKE (1) TABLET BY MOUTH ONCE DAILY.  [name] glucose blood (ACCU-CHEK SMARTVIEW) test strip Use as instructed  . glucose blood (ONETOUCH VERIO) test strip Check blood sugar daily-- dx E11.9  . HYDROcodone-acetaminophen (NORCO)  7.5-325 MG tablet Take 1 tablet by mouth every 6 (six) hours as needed for moderate pain.  [name] ipratropium (ATROVENT) 0.02 % nebulizer solution USE 1 VIAL IN NEBULIZER EVERY 4 HOURS AS NEEDED FOR WHEEZING.  [name] lamoTRIgine (LAMICTAL) 150 MG tablet Take 1 tablet (150 mg total) by mouth daily.  [name] levETIRAcetam (KEPPRA) 500 MG tablet TAKE (1) TABLET TWICE DAILY.  [name] levocetirizine (XYZAL) 5 MG tablet Take 1 tablet (5 mg total) by mouth every evening.  . Menthol, Topical Analgesic, (BIOFREEZE ROLL-ON) 4 % GEL Apply topically.  . miconazole (MICOTIN) 2 % cream Apply 1 application topically 2 (two) times daily.  . nitroGLYCERIN (NITROSTAT) 0.4 MG SL tablet PLACE ONE (1) TABLET UNDER TONGUE EVERY 5 MINUTES UP TO (3) DOSES AS NEEDED FOR CHEST PAIN.  [name] ondansetron (ZOFRAN) 4 MG tablet Take one to two tablets daily as needed for nausea.  [name] DELICA LANCETS 33G MISC 1 daily to keep diary of blood sugars-- Dx E11.9  . oxybutynin (DITROPAN) 5 MG tablet TAKE (1) TABLET TWICE DAILY.  . ranitidine (ZANTAC) 150 MG tablet TAKE 1 TABLET AT BEDTIME.  . rosuvastatin (CRESTOR) 10 MG tablet TAKE (1) TABLET BY MOUTH ONCE DAILY.  [name] sucralfate (CARAFATE) 1 GM/10ML suspension Take 10 mLs (1 g total) by mouth 2 (two) times daily.  [name] tiZANidine (ZANAFLEX) 4 MG tablet Take 1 tablet (4 mg total) by mouth every 6 (six) hours as needed for muscle spasms.  [name] tolterodine (DETROL) 2 MG tablet TAKE (1) TABLET TWICE  DAILY.  . traZODone (DESYREL) 150 MG tablet TAKE (1) TABLET DAILY AT BEDTIME.  . valACYclovir (VALTREX) 500 MG tablet TAKE (1) TABLET TWICE DAILY.  . VENTOLIN HFA 108 (90 Base) MCG/ACT inhaler INHALE 2 PUFFS EVERY 6 HOURS AS NEEDED FOR SHORTNESS OF BREATH AND WHEEZING.     1. Essential hypertension  No c/o chest pain or SOB. She checks blood pressures 3 x a day and takes cardivedilol based on blood pressure.   2. SYNCOPE  She has been having syncopial episodes and saw cardiologist 04/08/17. She was started on  carvedilol 3.125mg  daily.she is not to take if blood pressure is below135 systolic.  3. Gastroesophageal reflux disease with esophagitis  Saw GI on 04/16/17. He added carafate 2x a day and she says that that seems to be helping some with her symptoms.  4. Cervical radiculitis  Has chronic neck pan and takes norco 2 x a day. It is not tome to refill pain meds  5. Neural foraminal stenosis of cervical spine   6. Herpes simplex virus (HSV) infection  Has had a lot if recent outbreaks despite taking daily valtrex. She is not sexually active  7. Hyperlipidemia with target LDL less than 100  Doe snot watch diet  8. Anxiety state  Stays anxious takes valium 2x a day- have talked about her weaning down due to fact takes pain meds but says she cannot  9. SPINAL STENOSIS OF LUMBAR REGION  On pain management  10. Primary insomnia  Takes valium at bedtime to help her rest. Still says she does not sleep well.  11. Recurrent major depressive disorder, in partial remission (HCC)  Is on lamictal and lexapro which helps. She has frequent bouts of highs and lows.  12. Seizures (HCC)  Is on keppra not sure if syncopial episodes are seizures. Has appointmnet to follow up with neuro soon.  13. GAD (generalized anxiety disorder)  Discussed above  14. Pre-diabetes  Does not watch diet    New complaints: None today  Social history: Living with daughter right now. Stays with her brother some whom has a bad drug problem. She recently had bil hip injections given by dr. Oswaldo Done   Review of Systems  Constitutional: Negative for activity change and appetite change.  HENT: Negative.   Eyes: Negative for pain.  Respiratory: Negative for shortness of breath.   Cardiovascular: Negative for chest pain, palpitations and leg swelling.  Gastrointestinal: Negative for abdominal pain.  Endocrine: Negative for polydipsia.  Genitourinary: Negative.   Musculoskeletal: Positive for back pain.  Skin: Negative for  rash.  Neurological: Negative for dizziness, weakness and headaches.  Hematological: Does not bruise/bleed easily.  Psychiatric/Behavioral: Negative.   All other systems reviewed and are negative.      Objective:   Physical Exam  Constitutional: She is oriented to person, place, and time. She appears well-developed and well-nourished. No distress.  Neck: Normal range of motion. Neck supple.  Cardiovascular: Normal rate and regular rhythm.  Pulmonary/Chest: Effort normal and breath sounds normal.  Neurological: She is alert and oriented to person, place, and time.  Skin: Skin is warm.  Psychiatric: She has a normal mood and affect. Her behavior is normal. Judgment and thought content normal.    BP 121/73   Pulse 78   Temp 98.2 F (36.8 C) (Oral)   Ht 5\' 2"  (1.575 m)   Wt 145 lb (65.8 kg)   BMI 26.52 kg/m        Assessment & Plan:  1. Essential hypertension   2. SYNCOPE   3. Gastroesophageal reflux disease with esophagitis   4. Cervical radiculitis   5. Neural foraminal stenosis of cervical spine   6. Herpes simplex virus (HSV) infection   7. Hyperlipidemia with target LDL less than 100   8. Anxiety state   9. SPINAL STENOSIS OF LUMBAR REGION   10. Primary insomnia   11. Recurrent major depressive disorder, in partial remission (HCC)   12. Seizures (HCC)   13. GAD (generalized anxiety disorder)   14. Pre-diabetes   15. Dysuria    Meds ordered this encounter  Medications  . fluticasone (FLONASE) 50 MCG/ACT nasal spray    Sig: Place 2 sprays into both nostrils daily.    Dispense:  16 g    Refill:  6    Order Specific Question:   Supervising Provider    Answer:   Johna Sheriff [[id number]]   Labs pending Follow up with cardiology for carvedilol Labs pending Stress management Follow up in 3 months  [name] Daphine Deutscher, FNP

## 2017-04-21 NOTE — Patient Instructions (Signed)

## 2017-04-21 NOTE — Addendum Note (Signed)
Addended by: Bennie Pierini on: 04/21/2017 03:46 PM   Modules accepted: Orders

## 2017-04-22 LAB — CMP14+EGFR
ALK PHOS: 105 IU/L (ref 39–117)
ALT: 16 IU/L (ref 0–32)
AST: 17 IU/L (ref 0–40)
Albumin/Globulin Ratio: 1.6 (ref 1.2–2.2)
Albumin: 4.4 g/dL (ref 3.6–4.8)
BUN/Creatinine Ratio: 14 (ref 12–28)
BUN: 11 mg/dL (ref 8–27)
Bilirubin Total: 0.2 mg/dL (ref 0.0–1.2)
CO2: 27 mmol/L (ref 20–29)
CREATININE: 0.81 mg/dL (ref 0.57–1.00)
Calcium: 9.4 mg/dL (ref 8.7–10.3)
Chloride: 102 mmol/L (ref 96–106)
GFR calc Af Amer: 91 mL/min/{1.73_m2} (ref >59)
GFR calc non Af Amer: 79 mL/min/{1.73_m2} (ref >59)
GLOBULIN, TOTAL: 2.7 g/dL (ref 1.5–4.5)
GLUCOSE: 112 mg/dL — AB (ref 65–99)
Potassium: 4.2 mmol/L (ref 3.5–5.2)
Sodium: 143 mmol/L (ref 134–144)
Total Protein: 7.1 g/dL (ref 6.0–8.5)

## 2017-04-22 LAB — LIPID PANEL
CHOLESTEROL TOTAL: 194 mg/dL (ref 100–199)
Chol/HDL Ratio: 3.3 ratio (ref 0.0–4.4)
HDL: 59 mg/dL (ref >39)
LDL CALC: 105 mg/dL — AB (ref 0–99)
TRIGLYCERIDES: 151 mg/dL — AB (ref 0–149)
VLDL Cholesterol Cal: 30 mg/dL (ref 5–40)

## 2017-04-23 ENCOUNTER — Ambulatory Visit: Payer: 59 | Admitting: Physical Therapy

## 2017-04-28 ENCOUNTER — Other Ambulatory Visit: Payer: Self-pay | Admitting: Nurse Practitioner

## 2017-04-28 DIAGNOSIS — J3089 Other allergic rhinitis: Secondary | ICD-10-CM

## 2017-04-28 MED ORDER — LEVOCETIRIZINE DIHYDROCHLORIDE 5 MG PO TABS: 5.0000 mg | ORAL_TABLET | Freq: Every evening | ORAL | 1 refills | Status: DC

## 2017-04-28 MED ORDER — ALBUTEROL SULFATE (2.5 MG/3ML) 0.083% IN NEBU: INHALATION_SOLUTION | RESPIRATORY_TRACT | 2 refills | Status: DC

## 2017-04-28 NOTE — Addendum Note (Signed)
Addended by: Julious Payer D on: 04/28/2017 02:40 PM   Modules accepted: Orders

## 2017-04-28 NOTE — Addendum Note (Signed)
Addended by: Julious Payer D on: 04/28/2017 02:13 PM   Modules accepted: Orders

## 2017-04-29 ENCOUNTER — Other Ambulatory Visit: Payer: Self-pay | Admitting: Cardiology

## 2017-04-29 ENCOUNTER — Other Ambulatory Visit: Payer: Self-pay | Admitting: Nurse Practitioner

## 2017-04-30 ENCOUNTER — Encounter: Payer: Self-pay | Admitting: Physical Therapy

## 2017-04-30 ENCOUNTER — Ambulatory Visit: Payer: 59 | Attending: Pediatrics | Admitting: Physical Therapy

## 2017-04-30 ENCOUNTER — Other Ambulatory Visit: Payer: Self-pay

## 2017-04-30 ENCOUNTER — Other Ambulatory Visit: Payer: Self-pay | Admitting: Nurse Practitioner

## 2017-04-30 DIAGNOSIS — R293 Abnormal posture: Secondary | ICD-10-CM | POA: Insufficient documentation

## 2017-04-30 DIAGNOSIS — M25552 Pain in left hip: Secondary | ICD-10-CM | POA: Diagnosis present

## 2017-04-30 DIAGNOSIS — M25551 Pain in right hip: Secondary | ICD-10-CM | POA: Diagnosis present

## 2017-04-30 NOTE — Therapy (Signed)
[facility] [address] [place], [place], [address] Phone: [phone]   Fax:  [phone]  Physical Therapy Evaluation  Patient Details  Name: [name] MRN: [id number] Date of Birth: 1956/03/09 Referring Provider: Rex Kras MD   Encounter Date: 04/30/2017  PT End of Session - 04/30/17 1359    Visit Number  1    Number of Visits  4    Date for PT Re-Evaluation  05/28/17    PT Start Time  1114    PT Stop Time  1137    PT Time Calculation (min)  23 min       Past Medical History:  Diagnosis Date  . Anginal pain (HCC)    last time   . Anxiety   . Arthritis    RHEUMATOID  . Asthma   . Bipolar 1 disorder (HCC)   . COPD (chronic obstructive pulmonary disease) (HCC)   . Coronary artery disease    reported hx of "MI";  Echo 2009 with normal LVF;  Myoview 05/2011: no ischemia  . Depression   . Dyslipidemia   . Esophageal stricture   . Fibromyalgia   . GERD (gastroesophageal reflux disease)   . H/O hiatal hernia   . Head injury, unspecified   . Herpes simplex infection   . History of kidney stones   . Hyperlipidemia   . Hypertension   . Insomnia   . Myocardial infarction (HCC)   . Osteoporosis   . Pneumonia    hx  . Seizures (HCC)    last 3 weeks ago- prescribed keppra -never took didnt like side effects read about online  . Shortness of breath   . Sleep apnea    ? neg  . Spinal stenosis of lumbar region   . Spondylolisthesis   . Status post placement of implantable loop recorder   . Supraventricular tachycardia (HCC)   . Syncope and collapse    s/p ILR; no arhythmogenic cause identified  . UTI (lower urinary tract infection)     Past Surgical History:  Procedure Laterality Date  . BACK SURGERY    . BREAST SURGERY     lumpectomy  . CHOLECYSTECTOMY    . CYSTOSCOPY     stone  . DOPPLER ECHOCARDIOGRAPHY  2009  . head up tilt table testing  06/15/2007   [name]  . HEMORRHOID SURGERY    .  insertion of implatable loop recorder  08/11/2007   [name]  . POSTERIOR CERVICAL FUSION/FORAMINOTOMY N/A 12/19/2013   Procedure: RIGHT C3-4.C4-5 AND C5-6 FORAMINOTOMIES;  Surgeon: Kerrin Champagne, MD;  Location[facility] OR;  Service: Orthopedics;  Laterality: N/A;  . TUBAL LIGATION      There were no vitals filed for this visit.   Subjective Assessment - 04/30/17 1401    Subjective  Ms. [name] is well known to me as she has been to physical therapy on previous occasions regarding her low back pain.  Today she reports buttock pain with radiation into both hips.  She reports her hips have literally "locked up" resulting in writhing pain.  She rates her pain at a 10/10 today.  She reports pain meds help a bit to reduce her pain and almost everything increases her pain.      Pertinent History  Arhritis; fibromyalgia; seizures; lumbar, cervical surgery, HSV.    How long can you sit comfortably?  5 minutes.    How long can you stand comfortably?  5 minutes.    How  long can you walk comfortably?  Short distances.    Patient Stated Goals  Reduce pain.    Currently in Pain?  Yes    Pain Score  10-Worst pain ever    Pain Location  Buttocks    Pain Orientation  Mid;Right;Left    Pain Descriptors / Indicators  Aching;Constant;Penetrating;Radiating;Sharp;Shooting;Throbbing    Pain Type  Chronic pain    Pain Radiating Towards  Both hips.    Pain Onset  More than a month ago    Pain Frequency  Constant    Aggravating Factors   See above.    Pain Relieving Factors  See above.         [facility] PT Assessment - 04/30/17 0001      Assessment   Medical Diagnosis  Right hip pain.    Referring Provider  Rex Kras MD    Onset Date/Surgical Date  -- Ongoing over many yeras.      Precautions   Precautions  -- PACEMAKER.      Restrictions   Weight Bearing Restrictions  No      Balance Screen   Has the patient fallen in the past 6 months  Yes    Has the patient had a decrease in activity level  because of a fear of falling?   Yes    Is the patient reluctant to leave their home because of a fear of falling?   Yes      Home Environment   Living Environment  Private residence      Prior Function   Level of Independence  Independent      Posture/Postural Control   Posture/Postural Control  Postural limitations    Postural Limitations  Rounded Shoulders;Forward head;Flexed trunk    Posture Comments  Scoliosis with convexity of left.  The patient stands in 20 degrees of trunk flexion.      ROM / Strength   AROM / PROM / Strength  AROM;Strength      AROM   Overall AROM Comments  The patient stands in 20 degrees f trunk flexion and is unable to extend further.  She only was able to flex 10 degrees.  Bilateral hip flexion assessed in supine= 75 degrees.      Strength   Overall Strength Comments  Difficult to accurately assess bilateral LE strength due to patient's high pain-level, however, she did demonstrate anti-gravity movements bilateral at hips, knees and ankles.      Palpation   Palpation comment  Very tender to palpation over coccyx.  She reports pain "in" lower lumbar region.  Pain radiates toward bother greater trochnateric regions.      Special Tests    Special Tests  -- Normal bilateral LE DTR's.    Other special tests  (=) leg lengths; (+) bilateral SLR's.      Bed Mobility   Bed Mobility  -- Very slow and purposeful in obvious pain.      Transfers   Transfers  -- With armrest from sit to stand.      Ambulation/Gait   Gait Comments  The patient walks slowly in a flexed trunk posture with a shortened step/stride length.             Objective measurements completed on examination: See above findings.                   PT Long Term Goals - 04/30/17 1524      PT LONG TERM GOAL #1  Title  Independent with an HEP.    Time  4    Period  Weeks    Status  New             Plan - 04/30/17 1520    Clinical Impression Statement  The  patient presents to OPPT with chronic low back pain.  She has multiple postural abnormalites and severe limitations of spinal mobility.  Her pain and deficits impair her functional mobility.  She reports low back pain and coccygeal pain with radiation to both hips.  Patient will benefit from a few visits of PT to establish a HEP with hopes of improving her fucntional mobility.    History and Personal Factors relevant to plan of care:  Chronic spinal pain; Fibromyalgia.    Clinical Decision Making  Moderate    Rehab Potential  Fair    Clinical Impairments Affecting Rehab Potential  Chronicity and multiple co-morbidities.    PT Frequency  1x / week    PT Duration  4 weeks    PT Treatment/Interventions  Functional mobility training;Therapeutic activities;Therapeutic exercise;ADLs/Self Care Home Management    PT Next Visit Plan  Establish a low-level HEP to include draw-in progression and SKTC exercise.  Nustep okay.    Consulted and Agree with Plan of Care  Patient       Patient will benefit from skilled therapeutic intervention in order to improve the following deficits and impairments:  Abnormal gait, Decreased activity tolerance, Decreased range of motion, Postural dysfunction, Pain  Visit Diagnosis: Pain in left hip - Plan: PT plan of care cert/re-cert  Pain in right hip - Plan: PT plan of care cert/re-cert  Abnormal posture - Plan: PT plan of care cert/re-cert     Problem List Patient Active Problem List   Diagnosis Date Noted  . Encounter for chronic pain management 09/23/2016  . Pre-diabetes 09/02/2016  . Recurrent major depressive disorder, in partial remission (HCC) 07/14/2016  . Seizures (HCC) 07/14/2016  . GAD (generalized anxiety disorder) 07/14/2016  . Insomnia 09/05/2014  . Allergic rhinitis 09/05/2014  . Cervical spondylosis without myelopathy 12/19/2013    Class: Chronic  . Neural foraminal stenosis of cervical spine 12/19/2013  . Cervical radiculitis 09/19/2013  .  Lumbosacral spondylosis without myelopathy 11/16/2012  . Postlaminectomy syndrome, lumbar region 11/16/2012  . Herpes simplex virus (HSV) infection 10/31/2008  . Hyperlipidemia with target LDL less than 100 10/31/2008  . Anxiety state 10/31/2008  . Essential hypertension 10/31/2008  . Asthma 10/31/2008  . GERD 10/31/2008  . SPINAL STENOSIS OF LUMBAR REGION 10/31/2008  . Myalgia and myositis 10/31/2008  . Osteoporosis 10/31/2008  . SPONDYLOLISTHESIS 10/31/2008  . SYNCOPE 10/31/2008  . Sleep apnea 10/31/2008    Raegan Sipp, [place] MPT 04/30/2017, 3:31 PM  [facility] [address] [place], [place], [address] Phone: [phone]   Fax:  [phone]  Name: [name] MRN: [id number] Date of Birth: 06-02-1955

## 2017-05-07 ENCOUNTER — Ambulatory Visit: Payer: 59 | Attending: Pediatrics | Admitting: Physical Therapy

## 2017-05-07 ENCOUNTER — Encounter: Payer: Self-pay | Admitting: Physical Therapy

## 2017-05-07 ENCOUNTER — Ambulatory Visit (INDEPENDENT_AMBULATORY_CARE_PROVIDER_SITE_OTHER): Payer: 59

## 2017-05-07 DIAGNOSIS — R293 Abnormal posture: Secondary | ICD-10-CM | POA: Diagnosis present

## 2017-05-07 DIAGNOSIS — Z23 Encounter for immunization: Secondary | ICD-10-CM | POA: Diagnosis not present

## 2017-05-07 DIAGNOSIS — M25552 Pain in left hip: Secondary | ICD-10-CM | POA: Diagnosis not present

## 2017-05-07 DIAGNOSIS — M25551 Pain in right hip: Secondary | ICD-10-CM | POA: Insufficient documentation

## 2017-05-07 NOTE — Therapy (Signed)
[facility] [place], [place], [address] Phone: [phone]   Fax:  [phone]  Physical Therapy Treatment Discharge Summary  Patient Details  Name: [name] MRN: [id number] Date of Birth: 26-Jul-1955 Referring Provider: Assunta Found, MD   Encounter Date: 05/07/2017  PT End of Session - 05/07/17 1425    Visit Number  2    Number of Visits  4    Date for PT Re-Evaluation  05/28/17    PT Start Time  1320    PT Stop Time  1350    PT Time Calculation (min)  30 min    Activity Tolerance  Patient limited by pain    Behavior During Therapy  Anxious       Past Medical History:  Diagnosis Date  . Anginal pain ([place])    last time   . Anxiety   . Arthritis    RHEUMATOID  . Asthma   . Bipolar 1 disorder ([place])   . COPD (chronic obstructive pulmonary disease) ([place])   . Coronary artery disease    reported hx of "MI";  Echo 2009 with normal LVF;  Myoview 05/2011: no ischemia  . Depression   . Dyslipidemia   . Esophageal stricture   . Fibromyalgia   . GERD (gastroesophageal reflux disease)   . H/O hiatal hernia   . Head injury, unspecified   . Herpes simplex infection   . History of kidney stones   . Hyperlipidemia   . Hypertension   . Insomnia   . Myocardial infarction ([place])   . Osteoporosis   . Pneumonia    hx  . Seizures ([place])    last 3 weeks ago- prescribed keppra -never took didnt like side effects read about online  . Shortness of breath   . Sleep apnea    ? neg  . Spinal stenosis of lumbar region   . Spondylolisthesis   . Status post placement of implantable loop recorder   . Supraventricular tachycardia ([place])   . Syncope and collapse    s/p ILR; no arhythmogenic cause identified  . UTI (lower urinary tract infection)     Past Surgical History:  Procedure Laterality Date  . BACK SURGERY    . BREAST SURGERY     lumpectomy  . CHOLECYSTECTOMY    . CYSTOSCOPY     stone  . DOPPLER  ECHOCARDIOGRAPHY  2009  . head up tilt table testing  06/15/2007   [name]  . HEMORRHOID SURGERY    . insertion of implatable loop recorder  08/11/2007   [name]  . POSTERIOR CERVICAL FUSION/FORAMINOTOMY N/A 12/19/2013   Procedure: RIGHT C3-4.C4-5 AND C5-6 FORAMINOTOMIES;  Surgeon: [name], MD;  Location: [facility];  Service: Orthopedics;  Laterality: N/A;  . TUBAL LIGATION      There were no vitals filed for this visit.  Subjective Assessment - 05/07/17 1417    Subjective  Pt arriving to therapy 20 minutes late secondary to thinking her appointment was at 1:30 instead of 1:00. Pt reporting 10/10 low back pain which radiates down her buttock and bilateral hips. Pt reporting history of her legs "locking up".     Pertinent History  Arhritis; fibromyalgia; seizures; lumbar, cervical surgery, HSV.    How long can you sit comfortably?  5 minutes.    How long can you stand comfortably?  5 minutes.    How long can you walk comfortably?  Short distances.    Patient Stated Goals  Reduce pain.    Currently in Pain?  Yes    Pain Score  10-Worst pain ever    Pain Location  Back    Pain Orientation  Lower    Pain Descriptors / Indicators  Burning;Stabbing    Pain Type  Chronic pain    Pain Onset  More than a month ago    Pain Frequency  Constant         [facility] PT Assessment - 05/07/17 0001      Assessment   Medical Diagnosis  bilateral hip pain R>L    Referring Provider  Assunta Found, MD      Restrictions   Weight Bearing Restrictions  No      Balance Screen   Has the patient fallen in the past 6 months  Yes      Palpation   Palpation comment  Very tender to palpation over coccyx.  She reports pain "in" lower lumbar region.  Pain radiates toward bother greater trochnateric regions.                  [facility] Adult PT Treatment/Exercise - 05/07/17 0001      Transfers   Comments  sit to stand x 4, limited by pain and increased time for activity       Posture/Postural Control   Posture/Postural Control  Postural limitations    Postural Limitations  Rounded Shoulders;Forward head;Flexed trunk    Posture Comments  Scoliosis with convexity of left.  The patient stands in 20 degrees of trunk flexion.      Exercises   Exercises  -- PPT/abdominal sets x 10, supine marching x 20 with pain      Manual Therapy   Manual Therapy  Soft tissue mobilization    Manual therapy comments  Pt limited with pressure tolerance    Soft tissue mobilization  STW to lower thoracic and lumbar parspinals, SI joint line, and bialteral piriformis             PT Education - 05/07/17 1420    Education provided  Yes    Education Details  Anatomy and pain reasoning    Person(s) Educated  Patient    Methods  Explanation    Comprehension  Verbalized understanding          PT Long Term Goals - 05/07/17 1432      PT LONG TERM GOAL #1   Title  Independent with an HEP.    Baseline  Pt able to return demonstration of supine marching and PPT, but pt having difficulty tolerating.     Time  4    Status  Partially Met            Plan - 05/07/17 1427    Clinical Impression Statement  Pt arriving to therpay reporting 9-10/10 pain in her low back and hips which radiates down her the back of her legs. Pt reporting taking 2 pain meds prior to therapy. Pt was edu in a HEP for supine marching, PPT, but unable to tolerate due to pain. STW to lower thoracic and lumbar paraspinals and piriformis was performed bilaterally. Pt had difficulty tolerating pressure or trigger point release. Pt was edu in basic anatomy of lumbar spine and pelvis. We discussed discharging pt from skilled PT at present time due pt's intolerance to pain. Pt in agreement.     Rehab Potential  Fair    Clinical Impairments Affecting Rehab Potential  Chronicity and multiple co-morbidities.    PT  Frequency  1x / week    PT Duration  4 weeks    PT Treatment/Interventions  Functional mobility  training;Therapeutic activities;Therapeutic exercise;ADLs/Self Care Home Management    PT Next Visit Plan  Establish a low-level HEP to include draw-in progression and SKTC exercise.  Nustep okay.    PT Home Exercise Plan  Supine marching, PPT/ab sets    Consulted and Agree with Plan of Care  Patient       Patient will benefit from skilled therapeutic intervention in order to improve the following deficits and impairments:  Abnormal gait, Decreased activity tolerance, Decreased range of motion, Postural dysfunction, Pain  Visit Diagnosis: Pain in left hip  Pain in right hip  Abnormal posture  PHYSICAL THERAPY DISCHARGE SUMMARY  Visits from Start of Care: 2  Current functional level related to goals / functional outcomes: See above    Remaining deficits: See above   Education / Equipment: See above Plan: Patient agrees to discharge.  Patient goals were partially met. Patient is being discharged due to lack of progress.  ?????        Problem List Patient Active Problem List   Diagnosis Date Noted  . Encounter for chronic pain management 09/23/2016  . Pre-diabetes 09/02/2016  . Recurrent major depressive disorder, in partial remission ([place]) 07/14/2016  . Seizures ([place]) 07/14/2016  . GAD (generalized anxiety disorder) 07/14/2016  . Insomnia 09/05/2014  . Allergic rhinitis 09/05/2014  . Cervical spondylosis without myelopathy 12/19/2013    Class: Chronic  . Neural foraminal stenosis of cervical spine 12/19/2013  . Cervical radiculitis 09/19/2013  . Lumbosacral spondylosis without myelopathy 11/16/2012  . Postlaminectomy syndrome, lumbar region 11/16/2012  . Herpes simplex virus (HSV) infection 10/31/2008  . Hyperlipidemia with target LDL less than 100 10/31/2008  . Anxiety state 10/31/2008  . Essential hypertension 10/31/2008  . Asthma 10/31/2008  . GERD 10/31/2008  . SPINAL STENOSIS OF LUMBAR REGION 10/31/2008  . Myalgia and myositis 10/31/2008  . Osteoporosis  10/31/2008  . SPONDYLOLISTHESIS 10/31/2008  . SYNCOPE 10/31/2008  . Sleep apnea 10/31/2008    Oretha Caprice, MPT 05/07/2017, 2:42 PM  [facility] [place], [place], [address] Phone: [phone]   Fax:  [phone]  Name: [name] MRN: [id number] Date of Birth: [birth year]

## 2017-05-11 ENCOUNTER — Telehealth (INDEPENDENT_AMBULATORY_CARE_PROVIDER_SITE_OTHER): Payer: Self-pay | Admitting: Specialist

## 2017-05-11 ENCOUNTER — Ambulatory Visit: Payer: 59 | Admitting: Pediatrics

## 2017-05-11 ENCOUNTER — Telehealth: Payer: Self-pay | Admitting: *Deleted

## 2017-05-11 NOTE — Telephone Encounter (Signed)
Patient called wanting to ask you some questions about her sacroiliac joint. CB # [phone]

## 2017-05-11 NOTE — Telephone Encounter (Signed)
Left message for patient to call back.  Patient needs to reschedule appt to the middle of march for hip injections. Per Dr. Oswaldo Done

## 2017-05-12 ENCOUNTER — Telehealth (INDEPENDENT_AMBULATORY_CARE_PROVIDER_SITE_OTHER): Payer: Self-pay | Admitting: Specialist

## 2017-05-12 NOTE — Telephone Encounter (Signed)
Patient has been sched for 05/13/2017 @ 945am

## 2017-05-12 NOTE — Telephone Encounter (Signed)
sched for 05/13/2017

## 2017-05-12 NOTE — Telephone Encounter (Signed)
Pt is asking to talk to mmm nurse about her appts please call back

## 2017-05-12 NOTE — Telephone Encounter (Signed)
Aware.Can not be given pain medication for her back with out being seen .  Patient has cancelled numerous appointments Was scheduled for later this week but had cancelled that also.

## 2017-05-12 NOTE — Telephone Encounter (Signed)
Patient has been calling asking about an appointment with Dr. Alvester Morin. Per referral note, we are waiting on clearance from Dr. Otelia Sergeant for her to see Dr. Alvester Morin. Please advise. (Routing comment

## 2017-05-12 NOTE — Telephone Encounter (Signed)
This patient's last office visit with me was in 07/2014, almost 3 years ago, previous injections were ESI, facet and more recently in 06/2014 a left hip injection. She needs a follow up visit before another injection can be ordered to assess her  Hips and Spine to determine if an injection is warranted.

## 2017-05-13 ENCOUNTER — Encounter (INDEPENDENT_AMBULATORY_CARE_PROVIDER_SITE_OTHER): Payer: Self-pay | Admitting: Specialist

## 2017-05-13 ENCOUNTER — Ambulatory Visit (INDEPENDENT_AMBULATORY_CARE_PROVIDER_SITE_OTHER): Payer: 59 | Admitting: Specialist

## 2017-05-13 ENCOUNTER — Telehealth (INDEPENDENT_AMBULATORY_CARE_PROVIDER_SITE_OTHER): Payer: Self-pay | Admitting: Specialist

## 2017-05-13 ENCOUNTER — Ambulatory Visit (INDEPENDENT_AMBULATORY_CARE_PROVIDER_SITE_OTHER): Payer: 59

## 2017-05-13 VITALS — BP 150/79 | HR 72 | Ht 62.0 in | Wt 137.0 lb

## 2017-05-13 DIAGNOSIS — M7062 Trochanteric bursitis, left hip: Secondary | ICD-10-CM | POA: Diagnosis not present

## 2017-05-13 DIAGNOSIS — M7061 Trochanteric bursitis, right hip: Secondary | ICD-10-CM

## 2017-05-13 DIAGNOSIS — M4807 Spinal stenosis, lumbosacral region: Secondary | ICD-10-CM | POA: Diagnosis not present

## 2017-05-13 DIAGNOSIS — M545 Low back pain: Secondary | ICD-10-CM | POA: Diagnosis not present

## 2017-05-13 DIAGNOSIS — M7072 Other bursitis of hip, left hip: Secondary | ICD-10-CM

## 2017-05-13 DIAGNOSIS — M5136 Other intervertebral disc degeneration, lumbar region: Secondary | ICD-10-CM

## 2017-05-13 DIAGNOSIS — M1612 Unilateral primary osteoarthritis, left hip: Secondary | ICD-10-CM | POA: Diagnosis not present

## 2017-05-13 DIAGNOSIS — Z981 Arthrodesis status: Secondary | ICD-10-CM | POA: Diagnosis not present

## 2017-05-13 DIAGNOSIS — M533 Sacrococcygeal disorders, not elsewhere classified: Secondary | ICD-10-CM

## 2017-05-13 MED ORDER — MELOXICAM 15 MG PO TABS: 15.0000 mg | ORAL_TABLET | Freq: Every day | ORAL | 3 refills | Status: DC

## 2017-05-13 NOTE — Telephone Encounter (Signed)
I called and advised Harriett Sine that it should be once daily

## 2017-05-13 NOTE — Telephone Encounter (Signed)
[name] from [facility] called needing clarification on the Rx (Meloxicam) 15mg . The number to contact is [phone]

## 2017-05-13 NOTE — Patient Instructions (Signed)
Avoid frequent bending and stooping  No lifting greater than 10 lbs. May use ice or moist heat for pain. Weight loss is of benefit. Handicap license is approved.  The main ways of treat osteoarthritis, that are found to be success. Weight loss helps to decrease pain. Exercise is important to maintaining cartilage and thickness and strengthening. NSAIDs like motrin, tylenol, alleve are meds decreasing the inflamation. Ice is okay  In afternoon and evening and hot shower in the am An appointment with Dr. Alvester Morin is to be scheduled to perform an injection of the left  Ischial bursa. An appointment to see Dr. Magnus Ivan for evaluation of left hip OA to determine if a  Hip replacement may be of benefit . Will start you on meloxicam 15 mg daily.

## 2017-05-13 NOTE — Progress Notes (Signed)
Office Visit Note   Patient: [name]           Date of Birth: 04/12/1955           MRN: [id number] Visit Date: 05/13/2017              Requested by: Bennie Pierini, FNP [address] [place], [place] [address] PCP: Bennie Pierini, FNP   Assessment & Plan: Visit Diagnoses:  1. Low back pain, unspecified back pain laterality, unspecified chronicity, with sciatica presence unspecified   2. Spinal stenosis of lumbosacral region   3. Coccygodynia   4. DDD (degenerative disc disease), lumbar   5. History of lumbar spinal fusion   6. Ischial bursitis, left   7. Trochanteric bursitis, left hip   8. Trochanteric bursitis, right hip   9. Unilateral primary osteoarthritis, left hip     Plan: Avoid frequent bending and stooping  No lifting greater than 10 lbs. May use ice or moist heat for pain. Weight loss is of benefit. Handicap license is approved.  The main ways of treat osteoarthritis, that are found to be success. Weight loss helps to decrease pain. Exercise is important to maintaining cartilage and thickness and strengthening. NSAIDs like motrin, tylenol, alleve are meds decreasing the inflamation. Ice is okay  In afternoon and evening and hot shower in the am An appointment with Dr. Alvester Morin is to be scheduled to perform an injection of the left  Ischial bursa. An appointment to see Dr. Magnus Ivan for evaluation of left hip OA to determine if a  Hip replacement may be of benefit . Will start you on meloxicam 15 mg daily.  Follow-Up Instructions: Return in about 3 weeks (around 06/03/2017).   Orders:  Orders Placed This Encounter  Procedures  . XR Lumbar Spine 2-3 Views  . MR Lumbar Spine w/o contrast  . Ambulatory referral to Physical Medicine Rehab   Meds ordered this encounter  Medications  . meloxicam (MOBIC) 15 MG tablet    Sig: Take 1 tablet (15 mg total) by mouth daily. Take one tablet twice the first day then one tablet BID.    Dispense:   30 tablet    Refill:  3      Procedures: No procedures performed   Clinical Data: No additional findings.   Subjective: Chief Complaint  Patient presents with  . Lower Back - Pain    62 year old female with history of L4-5 fusion for spondylolisthesis. She has been on hydrocodone for pain control from [facility]. Reports that last Summer an episode of her  Hips locking up while in a swimming pool. She has had previous injeciton treatment by Dr. Alvester Morin. Her last injections wer done in 2016 with hip injection, prior to that she had SI joint injections and ESIs. She called earlier this week requesting cortisone shots and has not been seen by ourselves in some time, years. She reports pain in the tailbone with Feelings like she had prior to her lumbar surgery. She has pain with first standing to walk. No bowel or bladder difficulties.     Review of Systems  Constitutional: Negative.   HENT: Negative.   Eyes: Negative.   Respiratory: Negative.   Cardiovascular: Negative.   Gastrointestinal: Negative.   Endocrine: Negative.   Genitourinary: Negative.   Musculoskeletal: Negative.   Skin: Negative.   Allergic/Immunologic: Negative.   Neurological: Negative.   Hematological: Negative.   Psychiatric/Behavioral: Negative.      Objective: Vital Signs: BP [name])  150/79 (BP Location: Left Arm, Patient Position: Sitting)   Pulse 72   Ht 5\' 2"  (1.575 m)   Wt 137 lb (62.1 kg)   BMI 25.06 kg/m   Physical Exam  Constitutional: She is oriented to person, place, and time. She appears well-developed and well-nourished.  HENT:  Head: Normocephalic and atraumatic.  Eyes: EOM are normal. Pupils are equal, round, and reactive to light.  Neck: Normal range of motion. Neck supple.  Pulmonary/Chest: Effort normal and breath sounds normal.  Abdominal: Soft. Bowel sounds are normal.  Neurological: She is alert and oriented to person, place, and time.  Skin: Skin is warm and dry.  Psychiatric:  She has a normal mood and affect. Her behavior is normal. Judgment and thought content normal.    Right Hip Exam   Tenderness  The patient is experiencing tenderness in the greater trochanter.  Range of Motion  Abduction: abnormal  Adduction: abnormal  Extension: abnormal  Flexion: abnormal  External rotation: abnormal  Internal rotation: abnormal   Muscle Strength  Abduction: 5/5  Adduction: 5/5  Flexion: 5/5   Tests  FABER: negative Ober: negative  Other  Erythema: absent Scars: absent Sensation: normal Pulse: present   Left Hip Exam   Tenderness  The patient is experiencing tenderness in the greater trochanter and ischial tuberosity.  Range of Motion  Abduction: abnormal  Adduction: abnormal  Extension: abnormal  Flexion: abnormal  External rotation: abnormal  Internal rotation: abnormal   Muscle Strength  Abduction: 5/5  Adduction: 5/5  Flexion: 5/5   Tests  FABER: negative Ober: negative  Other  Erythema: absent Scars: absent Sensation: normal Pulse: present   Back Exam   Tenderness  The patient is experiencing tenderness in the lumbar.  Range of Motion  Extension: abnormal  Flexion: abnormal  Lateral bend right: abnormal  Lateral bend left: abnormal  Rotation right: abnormal  Rotation left: abnormal   Muscle Strength  Right Quadriceps:  5/5  Left Quadriceps:  5/5  Right Hamstrings:  5/5  Left Hamstrings:  5/5   Tests  Straight leg raise right: negative Straight leg raise left: negative  Reflexes  Patellar: normal Achilles: normal Babinski's sign: normal   Other  Toe walk: normal Heel walk: normal Sensation: normal Gait: normal  Erythema: no back redness Scars: absent      Specialty Comments:  No specialty comments available.  Imaging: No results found.   PMFS History: Patient Active Problem List   Diagnosis Date Noted  . Cervical spondylosis without myelopathy 12/19/2013    Priority: High    Class:  Chronic  . Encounter for chronic pain management 09/23/2016  . Pre-diabetes 09/02/2016  . Recurrent major depressive disorder, in partial remission ([facility]) 07/14/2016  . Seizures ([facility]) 07/14/2016  . GAD (generalized anxiety disorder) 07/14/2016  . Insomnia 09/05/2014  . Allergic rhinitis 09/05/2014  . Neural foraminal stenosis of cervical spine 12/19/2013  . Cervical radiculitis 09/19/2013  . Lumbosacral spondylosis without myelopathy 11/16/2012  . Postlaminectomy syndrome, lumbar region 11/16/2012  . Herpes simplex virus (HSV) infection 10/31/2008  . Hyperlipidemia with target LDL less than 100 10/31/2008  . Anxiety state 10/31/2008  . Essential hypertension 10/31/2008  . Asthma 10/31/2008  . GERD 10/31/2008  . SPINAL STENOSIS OF LUMBAR REGION 10/31/2008  . Myalgia and myositis 10/31/2008  . Osteoporosis 10/31/2008  . SPONDYLOLISTHESIS 10/31/2008  . SYNCOPE 10/31/2008  . Sleep apnea 10/31/2008   Past Medical History:  Diagnosis Date  . Anginal pain ([facility])    last  time   . Anxiety   . Arthritis    RHEUMATOID  . Asthma   . Bipolar 1 disorder ([facility])   . COPD (chronic obstructive pulmonary disease) ([facility])   . Coronary artery disease    reported hx of "MI";  Echo 2009 with normal LVF;  Myoview 05/2011: no ischemia  . Depression   . Dyslipidemia   . Esophageal stricture   . Fibromyalgia   . GERD (gastroesophageal reflux disease)   . H/O hiatal hernia   . Head injury, unspecified   . Herpes simplex infection   . History of kidney stones   . Hyperlipidemia   . Hypertension   . Insomnia   . Myocardial infarction ([facility])   . Osteoporosis   . Pneumonia    hx  . Seizures ([facility])    last 3 weeks ago- prescribed keppra -never took didnt like side effects read about online  . Shortness of breath   . Sleep apnea    ? neg  . Spinal stenosis of lumbar region   . Spondylolisthesis   . Status post placement of implantable loop recorder   . Supraventricular tachycardia ([facility])   .  Syncope and collapse    s/p ILR; no arhythmogenic cause identified  . UTI (lower urinary tract infection)     Family History  Problem Relation Age of Onset  . Heart attack Father   . Cancer Father   . Mental illness Father   . Cancer Mother   . Mental illness Mother   . Heart attack Brother        stents  . Alcohol abuse Brother   . Heart disease Brother   . Drug abuse Brother   . Diabetes Brother   . Colon cancer Maternal Aunt   . Cirrhosis Brother   . Stomach cancer Neg Hx     Past Surgical History:  Procedure Laterality Date  . BACK SURGERY    . BREAST SURGERY     lumpectomy  . CHOLECYSTECTOMY    . CYSTOSCOPY     stone  . DOPPLER ECHOCARDIOGRAPHY  2009  . head up tilt table testing  06/15/2007   [name]  . HEMORRHOID SURGERY    . insertion of implatable loop recorder  08/11/2007   [name]  . POSTERIOR CERVICAL FUSION/FORAMINOTOMY N/A 12/19/2013   Procedure: RIGHT C3-4.C4-5 AND C5-6 FORAMINOTOMIES;  Surgeon: Kerrin Champagne, MD;  Location: [facility] OR;  Service: Orthopedics;  Laterality: N/A;  . TUBAL LIGATION     Social History   Occupational History  . Not on file  Tobacco Use  . Smoking status: Never Smoker  . Smokeless tobacco: Never Used  Substance and Sexual Activity  . Alcohol use: No    Alcohol/week: 0.0 oz  . Drug use: No  . Sexual activity: Yes

## 2017-05-19 ENCOUNTER — Ambulatory Visit (HOSPITAL_COMMUNITY)
Admission: RE | Admit: 2017-05-19 | Discharge: 2017-05-19 | Disposition: A | Discharge status: Home / Self Care | Payer: 59 | Source: Ambulatory Visit | Attending: Specialist | Admitting: Specialist

## 2017-05-19 DIAGNOSIS — M4807 Spinal stenosis, lumbosacral region: Secondary | ICD-10-CM

## 2017-05-21 ENCOUNTER — Ambulatory Visit (INDEPENDENT_AMBULATORY_CARE_PROVIDER_SITE_OTHER): Payer: 59 | Admitting: Internal Medicine

## 2017-05-21 ENCOUNTER — Encounter: Payer: Self-pay | Admitting: Internal Medicine

## 2017-05-21 VITALS — BP 132/96 | HR 68 | Ht 62.0 in | Wt 144.0 lb

## 2017-05-21 DIAGNOSIS — R55 Syncope and collapse: Secondary | ICD-10-CM | POA: Diagnosis not present

## 2017-05-21 DIAGNOSIS — I10 Essential (primary) hypertension: Secondary | ICD-10-CM

## 2017-05-21 MED ORDER — CARVEDILOL 3.125 MG PO TABS: ORAL_TABLET | ORAL | 3 refills | Status: DC

## 2017-05-21 NOTE — Patient Instructions (Addendum)
Medication Instructions:  Your physician has recommended you make the following change in your medication:  1.   Instructions for taking carvedilol 3.125 mg tab  A.  If your systolic blood pressure is less than 130 do not take any carvedilol  B.  If your systolic blood pressure is between 130 and 150 take one tablet by mouth twice a day.  C.  If your systolic blood pressure is greater than 150 take two tablets by mouth twice a day.  Labwork: None ordered.  Testing/Procedures: None ordered.  Follow-Up: Your physician wants you to follow-up in: one year with Dr. Ladona Ridgel.   You will receive a reminder letter in the mail two months in advance. If you don't receive a letter, please call our office to schedule the follow-up appointment.  Any Other Special Instructions Will Be Listed Below (If Applicable).  If you need a refill on your cardiac medications before your next appointment, please call your pharmacy.

## 2017-05-21 NOTE — Progress Notes (Signed)
HPI Ms. Almon returns today for followup of her autonomic dysfunction and HTN. She has recently had problems with elevated blood pressures in the 170-190 range but also some periods of low blood pressure. I have reviewed her log of pressures today. She has a host of other complaints. Including back pain and leg pain. She is pending surgical evaluation. She is still smoking. She notes increased stress.   Allergies  Allergen Reactions  . Codeine Other (See Comments)    "I will have a heart attack."  . Morphine And Related Other (See Comments)    "It will cause me to have a heart attack."  . Ambien [Zolpidem Tartrate] Nausea And Vomiting  . Lyrica [Pregabalin] Swelling and Other (See Comments)    Weight gain  . Neurontin [Gabapentin] Other (See Comments)    Causes elevated LFTs      Current Outpatient Medications  Medication Sig Dispense Refill  . acyclovir ointment (ZOVIRAX) 5 % APPLY TO AFFECTED AREA EVERY 3 HOURS 30 g 4  . albuterol (PROVENTIL) (2.5 MG/3ML) 0.083% nebulizer solution USE 1 VIAL IN NEBULIZER EVERY 4 HOURS AS NEEDED FOR WHEEZING. 180 mL 2  . busPIRone (BUSPAR) 10 MG tablet TAKE (1) TABLET TWICE DAILY. 60 tablet 2  . butalbital-acetaminophen-caffeine (FIORICET, ESGIC) 50-325-40 MG tablet TAKE 1 TABLET TWICE DAILY AS NEEDED FOR HEADACHE. 20 tablet 1  . carvedilol (COREG) 3.125 MG tablet Take 3.125 mg by mouth 3 (three) times daily. Take 2 tabs TID    . dexlansoprazole (DEXILANT) 60 MG capsule Take 1 capsule (60 mg total) by mouth daily. 30 capsule 5  . diazepam (VALIUM) 5 MG tablet TAKE 1 TABLET EVERY 12 HOURS AS NEEDED FOR ANXIETY. 60 tablet 2  . DULoxetine (CYMBALTA) 60 MG capsule Take 1 capsule (60 mg total) by mouth at bedtime. 30 capsule 5  . escitalopram (LEXAPRO) 20 MG tablet TAKE (1) TABLET BY MOUTH ONCE DAILY. 30 tablet 5  . estradiol (ESTRACE) 0.1 MG/GM vaginal cream PLACE 1 APPLICATORFUL VAGINALLY AT BEDTIME. 42.5 g 4  . fludrocortisone (FLORINEF) 0.1  MG tablet Take 1 tablet (0.1 mg total) by mouth daily. 60 tablet 1  . fluticasone (FLONASE) 50 MCG/ACT nasal spray Place 2 sprays into both nostrils daily. 16 g 6  . fluticasone furoate-vilanterol (BREO ELLIPTA) 200-25 MCG/INH AEPB Inhale 1 puff into the lungs daily. 60 each 5  . furosemide (LASIX) 40 MG tablet TAKE (1) TABLET BY MOUTH ONCE DAILY. 90 tablet 1  . HYDROcodone-acetaminophen (NORCO) 7.5-325 MG tablet Take 1 tablet by mouth every 6 (six) hours as needed for moderate pain. 30 tablet 0  . ipratropium (ATROVENT) 0.02 % nebulizer solution USE 1 VIAL IN NEBULIZER EVERY 4 HOURS AS NEEDED FOR WHEEZING. 150 mL 0  . lamoTRIgine (LAMICTAL) 150 MG tablet Take 1 tablet (150 mg total) by mouth daily. 30 tablet 5  . levETIRAcetam (KEPPRA) 500 MG tablet TAKE (1) TABLET TWICE DAILY. 60 tablet 5  . levocetirizine (XYZAL) 5 MG tablet Take 1 tablet (5 mg total) by mouth every evening. 30 tablet 1  . meloxicam (MOBIC) 15 MG tablet Take 1 tablet (15 mg total) by mouth daily. Take one tablet twice the first day then one tablet BID. 30 tablet 3  . miconazole (MICOTIN) 2 % cream Apply 1 application topically 2 (two) times daily. 28.35 g 0  . nitroGLYCERIN (NITROSTAT) 0.4 MG SL tablet PLACE ONE (1) TABLET UNDER TONGUE EVERY 5 MINUTES UP TO (3) DOSES AS NEEDED FOR CHEST  PAIN. 25 tablet 0  . ondansetron (ZOFRAN) 4 MG tablet Take one to two tablets daily as needed for nausea. 60 tablet 3  . oxybutynin (DITROPAN) 5 MG tablet TAKE (1) TABLET TWICE DAILY. 60 tablet 0  . oxybutynin (DITROPAN) 5 MG tablet TAKE (1) TABLET TWICE DAILY. 60 tablet 0  . ranitidine (ZANTAC) 150 MG tablet TAKE 1 TABLET AT BEDTIME. 30 tablet 5  . rosuvastatin (CRESTOR) 10 MG tablet TAKE (1) TABLET BY MOUTH ONCE DAILY. 30 tablet 5  . sucralfate (CARAFATE) 1 GM/10ML suspension Take 10 mLs (1 g total) by mouth 2 (two) times daily. 420 mL 1  . tiZANidine (ZANAFLEX) 4 MG tablet TAKE 1 TABLET EVERY 6 HOURS AS NEEDED FOR MUSCLE SPASM 30 tablet 2  .  tolterodine (DETROL) 2 MG tablet TAKE (1) TABLET TWICE DAILY. 60 tablet 5  . traZODone (DESYREL) 150 MG tablet TAKE (1) TABLET DAILY AT BEDTIME. 30 tablet 2  . valACYclovir (VALTREX) 500 MG tablet TAKE (1) TABLET TWICE DAILY. 60 tablet 5  . VENTOLIN HFA 108 (90 Base) MCG/ACT inhaler INHALE 2 PUFFS EVERY 6 HOURS AS NEEDED FOR SHORTNESS OF BREATH AND WHEEZING. 18 g 5   Current Facility-Administered Medications  Medication Dose Route Frequency Provider Last Rate Last Dose  . triamcinolone acetonide (KENALOG-40) injection 40 mg  40 mg Other Once Johna Sheriff, MD      . triamcinolone acetonide (KENALOG-40) injection 40 mg  40 mg Other Once Johna Sheriff, MD         Past Medical History:  Diagnosis Date  . Anginal pain (HCC)    last time   . Anxiety   . Arthritis    RHEUMATOID  . Asthma   . Bipolar 1 disorder (HCC)   . COPD (chronic obstructive pulmonary disease) (HCC)   . Coronary artery disease    reported hx of "MI";  Echo 2009 with normal LVF;  Myoview 05/2011: no ischemia  . Depression   . Dyslipidemia   . Esophageal stricture   . Fibromyalgia   . GERD (gastroesophageal reflux disease)   . H/O hiatal hernia   . Head injury, unspecified   . Herpes simplex infection   . History of kidney stones   . Hyperlipidemia   . Hypertension   . Insomnia   . Myocardial infarction (HCC)   . Osteoporosis   . Pneumonia    hx  . Seizures (HCC)    last 3 weeks ago- prescribed keppra -never took didnt like side effects read about online  . Shortness of breath   . Sleep apnea    ? neg  . Spinal stenosis of lumbar region   . Spondylolisthesis   . Status post placement of implantable loop recorder   . Supraventricular tachycardia (HCC)   . Syncope and collapse    s/p ILR; no arhythmogenic cause identified  . UTI (lower urinary tract infection)     ROS:   All systems reviewed and negative except as noted in the HPI.   Past Surgical History:  Procedure Laterality Date  . BACK  SURGERY    . BREAST SURGERY     lumpectomy  . CHOLECYSTECTOMY    . CYSTOSCOPY     stone  . DOPPLER ECHOCARDIOGRAPHY  2009  . head up tilt table testing  06/15/2007   [name]  . HEMORRHOID SURGERY    . insertion of implatable loop recorder  08/11/2007   [name]  . POSTERIOR CERVICAL FUSION/FORAMINOTOMY N/A 12/19/2013  Procedure: RIGHT C3-4.C4-5 AND C5-6 FORAMINOTOMIES;  Surgeon: Kerrin Champagne, MD;  Location: [facility] OR;  Service: Orthopedics;  Laterality: N/A;  . TUBAL LIGATION       Family History  Problem Relation Age of Onset  . Heart attack Father   . Cancer Father   . Mental illness Father   . Cancer Mother   . Mental illness Mother   . Heart attack Brother        stents  . Alcohol abuse Brother   . Heart disease Brother   . Drug abuse Brother   . Diabetes Brother   . Colon cancer Maternal Aunt   . Cirrhosis Brother   . Stomach cancer Neg Hx      Social History   Socioeconomic History  . Marital status: Single    Spouse name: Not on file  . Number of children: Not on file  . Years of education: Not on file  . Highest education level: Not on file  Social Needs  . Financial resource strain: Not on file  . Food insecurity - worry: Not on file  . Food insecurity - inability: Not on file  . Transportation needs - medical: Not on file  . Transportation needs - non-medical: Not on file  Occupational History  . Not on file  Tobacco Use  . Smoking status: Never Smoker  . Smokeless tobacco: Never Used  Substance and Sexual Activity  . Alcohol use: No    Alcohol/week: 0.0 oz  . Drug use: No  . Sexual activity: Yes  Other Topics Concern  . Not on file  Social History Narrative   Patient lives at home with her brother.   Patient is on disability.    Patient has 8th grade education.    Patient has 6 children, 20 grand-chilren, and 3 great-grand children.      BP (!) 132/96   Pulse 68   Ht 5\' 2"  (1.575 m)   Wt 144 lb (65.3 kg)   BMI 26.34 kg/m    Physical Exam:  Well appearing middle aged woman, NAD HEENT: Unremarkable Neck:  6 cm JVD, no thyromegally Lymphatics:  No adenopathy Back:  No CVA tenderness Lungs:  Clear with no wheezes HEART:  Regular rate rhythm, no murmurs, no rubs, no clicks Abd:  soft, positive bowel sounds, no organomegally, no rebound, no guarding Ext:  2 plus pulses, no edema, no cyanosis, no clubbing Skin:  No rashes no nodules Neuro:  CN II through XII intact, motor grossly intact  EKG - none today   Assess/Plan: 1. Preoperative evaluation - she is stable from a preoperative surgical risk perspective. I think she is an acceptable risk and may proceed with hip surgery. 2. HTN - her blood pressure remains high but at times is also low. I have recommended she take an additional coreg for pressure over 150 and hold coreg for pressures under 130.  3. Autonomic dysfunction - she appears to be doing reasonably well with only one episode since her last visit. She did not pass out. 4. Palpitations - these are much improved and her HR's have been stable.  Leonia Reeves.D.

## 2017-05-22 ENCOUNTER — Ambulatory Visit (HOSPITAL_COMMUNITY)
Admission: RE | Admit: 2017-05-22 | Discharge: 2017-05-22 | Disposition: A | Discharge status: Home / Self Care | Payer: 59 | Source: Ambulatory Visit | Attending: Specialist | Admitting: Specialist

## 2017-05-22 ENCOUNTER — Other Ambulatory Visit (INDEPENDENT_AMBULATORY_CARE_PROVIDER_SITE_OTHER): Payer: Self-pay | Admitting: Specialist

## 2017-05-22 DIAGNOSIS — M4807 Spinal stenosis, lumbosacral region: Secondary | ICD-10-CM

## 2017-05-22 DIAGNOSIS — M4326 Fusion of spine, lumbar region: Secondary | ICD-10-CM | POA: Insufficient documentation

## 2017-05-22 DIAGNOSIS — M48061 Spinal stenosis, lumbar region without neurogenic claudication: Secondary | ICD-10-CM | POA: Insufficient documentation

## 2017-05-25 ENCOUNTER — Other Ambulatory Visit: Payer: Self-pay | Admitting: *Deleted

## 2017-05-25 ENCOUNTER — Other Ambulatory Visit: Payer: Self-pay | Admitting: Nurse Practitioner

## 2017-05-25 DIAGNOSIS — B009 Herpesviral infection, unspecified: Secondary | ICD-10-CM

## 2017-05-25 DIAGNOSIS — J309 Allergic rhinitis, unspecified: Secondary | ICD-10-CM

## 2017-05-25 DIAGNOSIS — M47812 Spondylosis without myelopathy or radiculopathy, cervical region: Secondary | ICD-10-CM

## 2017-05-25 DIAGNOSIS — R3915 Urgency of urination: Secondary | ICD-10-CM

## 2017-05-25 MED ORDER — VALACYCLOVIR HCL 500 MG PO TABS: ORAL_TABLET | ORAL | 5 refills | Status: DC

## 2017-05-25 MED ORDER — ALBUTEROL SULFATE HFA 108 (90 BASE) MCG/ACT IN AERS: INHALATION_SPRAY | RESPIRATORY_TRACT | 5 refills | Status: DC

## 2017-05-25 MED ORDER — OXYBUTYNIN CHLORIDE 5 MG PO TABS: ORAL_TABLET | ORAL | 5 refills | Status: DC

## 2017-05-25 MED ORDER — DEXLANSOPRAZOLE 60 MG PO CPDR
1.0000 | DELAYED_RELEASE_CAPSULE | Freq: Every day | ORAL | 2 refills | Status: DC
Start: 2017-05-25 — End: 2017-06-12

## 2017-05-25 MED ORDER — DIAZEPAM 5 MG PO TABS
ORAL_TABLET | ORAL | 2 refills | Status: DC
Start: 2017-05-25 — End: 2017-08-25

## 2017-05-25 MED ORDER — LAMOTRIGINE 150 MG PO TABS: 150.0000 mg | ORAL_TABLET | Freq: Every day | ORAL | 5 refills | Status: DC

## 2017-05-25 MED ORDER — FLUTICASONE FUROATE-VILANTEROL 200-25 MCG/INH IN AEPB: 1.0000 | INHALATION_SPRAY | Freq: Every day | RESPIRATORY_TRACT | 5 refills | Status: DC

## 2017-05-27 ENCOUNTER — Ambulatory Visit (INDEPENDENT_AMBULATORY_CARE_PROVIDER_SITE_OTHER): Payer: 59 | Admitting: Physical Medicine and Rehabilitation

## 2017-05-27 ENCOUNTER — Encounter (INDEPENDENT_AMBULATORY_CARE_PROVIDER_SITE_OTHER): Payer: Self-pay | Admitting: Physical Medicine and Rehabilitation

## 2017-05-27 ENCOUNTER — Ambulatory Visit (INDEPENDENT_AMBULATORY_CARE_PROVIDER_SITE_OTHER): Payer: 59

## 2017-05-27 ENCOUNTER — Ambulatory Visit (INDEPENDENT_AMBULATORY_CARE_PROVIDER_SITE_OTHER): Payer: 59 | Admitting: Orthopaedic Surgery

## 2017-05-27 ENCOUNTER — Encounter (INDEPENDENT_AMBULATORY_CARE_PROVIDER_SITE_OTHER): Payer: Self-pay | Admitting: Orthopaedic Surgery

## 2017-05-27 DIAGNOSIS — M7061 Trochanteric bursitis, right hip: Secondary | ICD-10-CM | POA: Diagnosis not present

## 2017-05-27 DIAGNOSIS — M7062 Trochanteric bursitis, left hip: Secondary | ICD-10-CM | POA: Diagnosis not present

## 2017-05-27 DIAGNOSIS — M25551 Pain in right hip: Secondary | ICD-10-CM

## 2017-05-27 DIAGNOSIS — M25552 Pain in left hip: Secondary | ICD-10-CM | POA: Diagnosis not present

## 2017-05-27 NOTE — Progress Notes (Deleted)
Pt states a pressure pain in lower back that radiates through her left leg. Pt states started 6 months ago, has gotten worse over in the past 3 months. Pt states laying on her back and sitting makes pain worse, pain meds eases pain. -Dye Allergies.

## 2017-05-27 NOTE — Patient Instructions (Signed)

## 2017-05-27 NOTE — Progress Notes (Signed)
Office Visit Note   Patient: [name]           Date of Birth: 1955-05-16           MRN: [id number] Visit Date: 05/27/2017              Requested by: Bennie Pierini, FNP [address] [place], [place] [address] PCP: Bennie Pierini, FNP   Assessment & Plan: Visit Diagnoses:  1. Pain of both hip joints     Plan: Her clinical exam and signs and symptoms are quite difficult to put together in terms of determining whether or not she would benefit from a hip replacement at all.  I told her that the hip replacement would not take care of the times on her leg gets "paralyzed".  It would not help with any back issues or sciatic issues from what I can tell based on her physical exam.  I will see her back in 4 weeks to see how the injections from Dr. Alvester Morin have helped her.  Even though she has panic attacks and anxiety through MRIs may need to get an MRI of her left hip to really truly assess the cartilage because her pain is certainly out of proportion of exam.  Follow-Up Instructions: Return in about 4 weeks (around 06/24/2017).   Orders:  No orders of the defined types were placed in this encounter.  No orders of the defined types were placed in this encounter.     Procedures: No procedures performed   Clinical Data: No additional findings.   Subjective: Chief Complaint  Patient presents with  . Left Hip - Follow-up  I am seeing the patient today at the request of Dr. Otelia Sergeant to evaluate her hips and the potential of whether or not she may need benefit from hip replacement surgery.  She is someone who has had lumbar spine surgery due to stenosis.  She has significant pain but is globally around both hips and mainly in her backside she says that her "tailbone hurts quite a bit".  She also says that her left leg comes paralyzed at times.  She complains of numbness and tingling.  She has had injections of the trochanteric area.  Apparently she is seeing Dr.  Alvester Morin today for injections and this may be either the hips or the SI joints but I am not sure.  The pain is not specifically just the hip joint is hard to get an idea of where she truly hurts because she hurts basically all over her pelvis and low back and lower legs.  She does take Norco but she says she is been on this for years and not on a daily basis.  She admits with a cane as well.  She is not overweight.  HPI  Review of Systems She currently denies any headache, chest pain, shortness of breath, fever, chills, nausea, vomiting.  Objective: Vital Signs: There were no vitals taken for this visit.  Physical Exam She is alert and oriented x3 and in no acute distress Ortho Exam Examination of her hips is quite difficult.  I can move both hips smoothly and fully but is hard to tell where she truly hurts because she hurts all over and mainly in the tailbone and her back when I put her hips through motion.  There is no blocks to motion at all. Specialty Comments:  No specialty comments available.  Imaging: No results found. X-rays of the pelvis and hips are reviewed independently and  shows still well-maintained joint space on both hips however the left hip does have periarticular osteophytes off of the femoral head-neck region and definitely some arthritic changes comparing the left hip to the right hip.  PMFS History: Patient Active Problem List   Diagnosis Date Noted  . Encounter for chronic pain management 09/23/2016  . Pre-diabetes 09/02/2016  . Recurrent major depressive disorder, in partial remission (HCC) 07/14/2016  . Seizures (HCC) 07/14/2016  . GAD (generalized anxiety disorder) 07/14/2016  . Insomnia 09/05/2014  . Allergic rhinitis 09/05/2014  . Cervical spondylosis without myelopathy 12/19/2013    Class: Chronic  . Neural foraminal stenosis of cervical spine 12/19/2013  . Cervical radiculitis 09/19/2013  . Lumbosacral spondylosis without myelopathy 11/16/2012  .  Postlaminectomy syndrome, lumbar region 11/16/2012  . Herpes simplex virus (HSV) infection 10/31/2008  . Hyperlipidemia with target LDL less than 100 10/31/2008  . Anxiety state 10/31/2008  . Essential hypertension 10/31/2008  . Asthma 10/31/2008  . GERD 10/31/2008  . SPINAL STENOSIS OF LUMBAR REGION 10/31/2008  . Myalgia and myositis 10/31/2008  . Osteoporosis 10/31/2008  . SPONDYLOLISTHESIS 10/31/2008  . SYNCOPE 10/31/2008  . Sleep apnea 10/31/2008   Past Medical History:  Diagnosis Date  . Anginal pain (HCC)    last time   . Anxiety   . Arthritis    RHEUMATOID  . Asthma   . Bipolar 1 disorder (HCC)   . COPD (chronic obstructive pulmonary disease) (HCC)   . Coronary artery disease    reported hx of "MI";  Echo 2009 with normal LVF;  Myoview 05/2011: no ischemia  . Depression   . Dyslipidemia   . Esophageal stricture   . Fibromyalgia   . GERD (gastroesophageal reflux disease)   . H/O hiatal hernia   . Head injury, unspecified   . Herpes simplex infection   . History of kidney stones   . Hyperlipidemia   . Hypertension   . Insomnia   . Myocardial infarction (HCC)   . Osteoporosis   . Pneumonia    hx  . Seizures (HCC)    last 3 weeks ago- prescribed keppra -never took didnt like side effects read about online  . Shortness of breath   . Sleep apnea    ? neg  . Spinal stenosis of lumbar region   . Spondylolisthesis   . Status post placement of implantable loop recorder   . Supraventricular tachycardia (HCC)   . Syncope and collapse    s/p ILR; no arhythmogenic cause identified  . UTI (lower urinary tract infection)     Family History  Problem Relation Age of Onset  . Heart attack Father   . Cancer Father   . Mental illness Father   . Cancer Mother   . Mental illness Mother   . Heart attack Brother        stents  . Alcohol abuse Brother   . Heart disease Brother   . Drug abuse Brother   . Diabetes Brother   . Colon cancer Maternal Aunt   . Cirrhosis  Brother   . Stomach cancer Neg Hx     Past Surgical History:  Procedure Laterality Date  . BACK SURGERY    . BREAST SURGERY     lumpectomy  . CHOLECYSTECTOMY    . CYSTOSCOPY     stone  . DOPPLER ECHOCARDIOGRAPHY  2009  . head up tilt table testing  06/15/2007   [name]  . HEMORRHOID SURGERY    . insertion of implatable  loop recorder  08/11/2007   [name]  . POSTERIOR CERVICAL FUSION/FORAMINOTOMY N/A 12/19/2013   Procedure: RIGHT C3-4.C4-5 AND C5-6 FORAMINOTOMIES;  Surgeon: Kerrin Champagne, MD;  Location: [facility] OR;  Service: Orthopedics;  Laterality: N/A;  . TUBAL LIGATION     Social History   Occupational History  . Not on file  Tobacco Use  . Smoking status: Never Smoker  . Smokeless tobacco: Never Used  Substance and Sexual Activity  . Alcohol use: No    Alcohol/week: 0.0 oz  . Drug use: No  . Sexual activity: Yes

## 2017-05-27 NOTE — Progress Notes (Signed)
[name] - 62 y.o. female MRN [id number]  Date of birth: 10-22-1955  Office Visit Note: Visit Date: 05/27/2017 PCP: Erica Pierini, FNP Referred by: [name], [name], *  Subjective: Chief Complaint  Patient presents with  . Lower Back - Pain   HPI: [name] comes in today with pain over both greater trochanters.  She has a history of lumbar fusion.  She came in today at the request of Dr. Otelia [name] for left ischial bursa injection but she has no pain over the bursa at least from initial standpoint today.  Abdomen take the liberty to complete bilateral greater trochanteric injections with fluoroscopic guidance.  Fluoroscopic guidance will be utilized to the patient's body habitus.  Depending on results she can return for initial bursa injection.    ROS Otherwise per HPI.  Assessment & Plan: Visit Diagnoses: No diagnosis found.  Plan: Findings:  Bilateral greater trochanteric bursa injections with fluoroscopic guidance.    Meds & Orders: No orders of the defined types were placed in this encounter.  No orders of the defined types were placed in this encounter.   Follow-up: No Follow-up on file.   Procedures: Large Joint Inj: bilateral greater trochanter on 05/27/2017 2:22 PM Indications: pain and diagnostic evaluation Details: 22 G 3.5 in needle, fluoroscopy-guided lateral approach  Arthrogram: No  Medications (Right): 60 mg triamcinolone acetonide 40 MG/ML; 6 mL bupivacaine 0.25 % Medications (Left): 60 mg triamcinolone acetonide 40 MG/ML; 6 mL bupivacaine 0.25 % Outcome: tolerated well, no immediate complications  There was excellent flow of contrast outlined the greater trochanteric bursa without vascular uptake. Procedure, treatment alternatives, risks and benefits explained, specific risks discussed. Consent was given by the patient. Immediately prior to procedure a time out was called to verify the correct patient, procedure, equipment, support staff and  site/side marked as required. Patient was prepped and draped in the usual sterile fashion.      No notes on file   Clinical History: CT CERVICAL MYELOGRAM FINDINGS:  There is a mild dextroconvex torticollis with the apex at C4. There is multilevel degenerative spondylolisthesis. 2 mm anterolisthesis of C4 on C5 appears facet mediated. 2 mm anterolisthesis of C7 on T1 and T1 on T2, also likely facet mediated and degenerative. There is no cervical cord swelling to suggest an intramedullary lesion. Craniocervical junction appears normal. Accessory ossicle present at the C1-C2 junction. Lung apices appear within normal limits aside from atelectasis.  C2-C3:  Mild disc and facet degeneration.  No stenosis.  C3-C4: Severe RIGHT facet arthrosis with RIGHT foraminal stenosis due to a combination of anterior facet spurring and uncovertebral spurring. Central canal is patent. The disc is degenerated with shallow disc osteophyte complex. The LEFT foramen appears patent. Very mild LEFT facet degeneration.  C4-C5: Severe disc degeneration with shallow broad-based disc osteophyte complex. The central canal is mildly narrowed. There is severe RIGHT foraminal stenosis associated with both uncovertebral spurring and severe RIGHT facet arthrosis. LEFT foramen appears adequately patent.  C5-C6: Broad-based RIGHT eccentric disc osteophyte complex. Severe RIGHT foraminal stenosis due to a combination of uncovertebral spurring and facet arthrosis. The LEFT foramen and again acute appears patent. The disc is degenerated with a broad-based disc osteophyte complex and very mild central stenosis without cord deformity.  C6-C7: Severe disc degeneration with broad-based disc osteophyte complex. Mild facet degeneration. Central canal is patent. Both neural foramina appear adequately patent.  C7-T1: Severe LEFT and minimal RIGHT facet arthrosis. Mild to moderate LEFT foraminal stenosis  associated with anterolisthesis and  anterior facet spurring. The RIGHT neural foramen appears patent.  T1-T2: Severe bilateral facet arthrosis accounts for the anterolisthesis. Central canal appears patent. Mild symmetric bilateral foraminal stenosis associated with facet degeneration.  T2-T3: Bilateral facet arthrosis without stenosis.  IMPRESSION: 1. Technically successful lumbar puncture at L3-L4 using pencil point needle for cervical myelogram. 2. Moderate multilevel cervical spondylosis and severe RIGHT greater than LEFT facet arthrosis. This produces foraminal stenosis that potentially affects the RIGHT C4, C5, C6 nerves in this patient with RIGHT upper extremity radiculopathy. 3. Central stenosis associated with disc osteophyte complexes is mild, most pronounced at C4-C5 and C5-C6.   Electronically Signed   By: [name] M.D.   On: 10/17/2013 11:43  She reports that  has never smoked. she has never used smokeless tobacco. No results for input(s): HGBA1C, LABURIC in the last 8760 hours.  Objective:  VS:  HT:    WT:   BMI:     BP:   HR: bpm  TEMP: ( )  RESP:  Physical Exam  Musculoskeletal:  Patient ambulates without aid with good distal strength.  She has exquisite pain over both greater trochanters left somewhat more than right.  She has no pain over the ischial bursa.    Ortho Exam Imaging: No results found.  Past Medical/Family/Surgical/Social History: Medications & Allergies reviewed per EMR Patient Active Problem List   Diagnosis Date Noted  . Encounter for chronic pain management 09/23/2016  . Pre-diabetes 09/02/2016  . Recurrent major depressive disorder, in partial remission (HCC) 07/14/2016  . Seizures (HCC) 07/14/2016  . GAD (generalized anxiety disorder) 07/14/2016  . Insomnia 09/05/2014  . Allergic rhinitis 09/05/2014  . Cervical spondylosis without myelopathy 12/19/2013    Class: Chronic  . Neural foraminal stenosis of cervical spine  12/19/2013  . Cervical radiculitis 09/19/2013  . Lumbosacral spondylosis without myelopathy 11/16/2012  . Postlaminectomy syndrome, lumbar region 11/16/2012  . Herpes simplex virus (HSV) infection 10/31/2008  . Hyperlipidemia with target LDL less than 100 10/31/2008  . Anxiety state 10/31/2008  . Essential hypertension 10/31/2008  . Asthma 10/31/2008  . GERD 10/31/2008  . SPINAL STENOSIS OF LUMBAR REGION 10/31/2008  . Myalgia and myositis 10/31/2008  . Osteoporosis 10/31/2008  . SPONDYLOLISTHESIS 10/31/2008  . SYNCOPE 10/31/2008  . Sleep apnea 10/31/2008   Past Medical History:  Diagnosis Date  . Anginal pain (HCC)    last time   . Anxiety   . Arthritis    RHEUMATOID  . Asthma   . Bipolar 1 disorder (HCC)   . COPD (chronic obstructive pulmonary disease) (HCC)   . Coronary artery disease    reported hx of "MI";  Echo 2009 with normal LVF;  Myoview 05/2011: no ischemia  . Depression   . Dyslipidemia   . Esophageal stricture   . Fibromyalgia   . GERD (gastroesophageal reflux disease)   . H/O hiatal hernia   . Head injury, unspecified   . Herpes simplex infection   . History of kidney stones   . Hyperlipidemia   . Hypertension   . Insomnia   . Myocardial infarction (HCC)   . Osteoporosis   . Pneumonia    hx  . Seizures (HCC)    last 3 weeks ago- prescribed keppra -never took didnt like side effects read about online  . Shortness of breath   . Sleep apnea    ? neg  . Spinal stenosis of lumbar region   . Spondylolisthesis   . Status post placement of implantable loop recorder   .  Supraventricular tachycardia (HCC)   . Syncope and collapse    s/p ILR; no arhythmogenic cause identified  . UTI (lower urinary tract infection)    Family History  Problem Relation Age of Onset  . Heart attack Father   . Cancer Father   . Mental illness Father   . Cancer Mother   . Mental illness Mother   . Heart attack Brother        stents  . Alcohol abuse Brother   . Heart  disease Brother   . Drug abuse Brother   . Diabetes Brother   . Colon cancer Maternal Aunt   . Cirrhosis Brother   . Stomach cancer Neg Hx    Past Surgical History:  Procedure Laterality Date  . BACK SURGERY    . BREAST SURGERY     lumpectomy  . CHOLECYSTECTOMY    . CYSTOSCOPY     stone  . DOPPLER ECHOCARDIOGRAPHY  2009  . head up tilt table testing  06/15/2007   [name]  . HEMORRHOID SURGERY    . insertion of implatable loop recorder  08/11/2007   [name]  . POSTERIOR CERVICAL FUSION/FORAMINOTOMY N/A 12/19/2013   Procedure: RIGHT C3-4.C4-5 AND C5-6 FORAMINOTOMIES;  Surgeon: Kerrin Champagne, MD;  Location: [facility] OR;  Service: Orthopedics;  Laterality: N/A;  . TUBAL LIGATION     Social History   Occupational History  . Not on file  Tobacco Use  . Smoking status: Never Smoker  . Smokeless tobacco: Never Used  Substance and Sexual Activity  . Alcohol use: No    Alcohol/week: 0.0 oz  . Drug use: No  . Sexual activity: Yes

## 2017-05-29 ENCOUNTER — Other Ambulatory Visit: Payer: Self-pay | Admitting: Nurse Practitioner

## 2017-05-29 MED ORDER — BUPIVACAINE HCL 0.25 % IJ SOLN
6.0000 mL | INTRAMUSCULAR | Status: AC | PRN
  Administered 2017-05-27: 6 mL via INTRA_ARTICULAR

## 2017-05-29 MED ORDER — BUPIVACAINE HCL 0.25 % IJ SOLN
6.0000 mL | INTRAMUSCULAR | Status: AC | PRN
Start: 2017-05-27 — End: 2017-05-27
  Administered 2017-05-27: 6 mL via INTRA_ARTICULAR

## 2017-05-29 MED ORDER — TRIAMCINOLONE ACETONIDE 40 MG/ML IJ SUSP
60.0000 mg | INTRAMUSCULAR | Status: AC | PRN
  Administered 2017-05-27: 60 mg via INTRA_ARTICULAR

## 2017-05-29 MED ORDER — TRIAMCINOLONE ACETONIDE 40 MG/ML IJ SUSP
60.0000 mg | INTRAMUSCULAR | Status: AC | PRN
Start: 2017-05-27 — End: 2017-05-27
  Administered 2017-05-27: 60 mg via INTRA_ARTICULAR

## 2017-06-01 DIAGNOSIS — S6992XA Unspecified injury of left wrist, hand and finger(s), initial encounter: Secondary | ICD-10-CM | POA: Diagnosis not present

## 2017-06-11 ENCOUNTER — Ambulatory Visit: Payer: 59 | Admitting: Nurse Practitioner

## 2017-06-12 ENCOUNTER — Ambulatory Visit (INDEPENDENT_AMBULATORY_CARE_PROVIDER_SITE_OTHER): Payer: 59 | Admitting: Nurse Practitioner

## 2017-06-12 ENCOUNTER — Ambulatory Visit (INDEPENDENT_AMBULATORY_CARE_PROVIDER_SITE_OTHER): Payer: 59 | Admitting: Specialist

## 2017-06-12 ENCOUNTER — Encounter: Payer: Self-pay | Admitting: Nurse Practitioner

## 2017-06-12 VITALS — BP 164/86 | HR 64 | Temp 97.0°F | Ht 62.0 in | Wt 146.0 lb

## 2017-06-12 DIAGNOSIS — J4520 Mild intermittent asthma, uncomplicated: Secondary | ICD-10-CM | POA: Diagnosis not present

## 2017-06-12 DIAGNOSIS — F411 Generalized anxiety disorder: Secondary | ICD-10-CM

## 2017-06-12 DIAGNOSIS — K2100 Gastro-esophageal reflux disease with esophagitis, without bleeding: Secondary | ICD-10-CM

## 2017-06-12 DIAGNOSIS — E785 Hyperlipidemia, unspecified: Secondary | ICD-10-CM

## 2017-06-12 DIAGNOSIS — R3915 Urgency of urination: Secondary | ICD-10-CM

## 2017-06-12 DIAGNOSIS — R300 Dysuria: Secondary | ICD-10-CM | POA: Diagnosis not present

## 2017-06-12 DIAGNOSIS — R569 Unspecified convulsions: Secondary | ICD-10-CM

## 2017-06-12 DIAGNOSIS — F331 Major depressive disorder, recurrent, moderate: Secondary | ICD-10-CM

## 2017-06-12 DIAGNOSIS — F5101 Primary insomnia: Secondary | ICD-10-CM | POA: Diagnosis not present

## 2017-06-12 DIAGNOSIS — G44229 Chronic tension-type headache, not intractable: Secondary | ICD-10-CM | POA: Diagnosis not present

## 2017-06-12 DIAGNOSIS — I10 Essential (primary) hypertension: Secondary | ICD-10-CM | POA: Diagnosis not present

## 2017-06-12 DIAGNOSIS — M810 Age-related osteoporosis without current pathological fracture: Secondary | ICD-10-CM

## 2017-06-12 DIAGNOSIS — F3341 Major depressive disorder, recurrent, in partial remission: Secondary | ICD-10-CM | POA: Diagnosis not present

## 2017-06-12 DIAGNOSIS — G4733 Obstructive sleep apnea (adult) (pediatric): Secondary | ICD-10-CM

## 2017-06-12 DIAGNOSIS — M47817 Spondylosis without myelopathy or radiculopathy, lumbosacral region: Secondary | ICD-10-CM

## 2017-06-12 LAB — URINALYSIS, COMPLETE
BILIRUBIN UA: NEGATIVE
GLUCOSE, UA: NEGATIVE
KETONES UA: NEGATIVE
Leukocytes, UA: NEGATIVE
NITRITE UA: NEGATIVE
PROTEIN UA: NEGATIVE
RBC UA: NEGATIVE
Specific Gravity, UA: 1.01 (ref 1.005–1.030)
Urobilinogen, Ur: 0.2 mg/dL (ref 0.2–1.0)
pH, UA: 7 (ref 5.0–7.5)

## 2017-06-12 LAB — MICROSCOPIC EXAMINATION
RBC, UA: NONE SEEN /hpf (ref 0–?)
Renal Epithel, UA: NONE SEEN /hpf

## 2017-06-12 MED ORDER — RANITIDINE HCL 150 MG PO TABS: 150.0000 mg | ORAL_TABLET | Freq: Every day | ORAL | 5 refills | Status: DC

## 2017-06-12 MED ORDER — TOLTERODINE TARTRATE 2 MG PO TABS: ORAL_TABLET | ORAL | 5 refills | Status: DC

## 2017-06-12 MED ORDER — LEVETIRACETAM 500 MG PO TABS: ORAL_TABLET | ORAL | 5 refills | Status: DC

## 2017-06-12 MED ORDER — FUROSEMIDE 40 MG PO TABS: ORAL_TABLET | ORAL | 1 refills | Status: DC

## 2017-06-12 MED ORDER — ROSUVASTATIN CALCIUM 10 MG PO TABS: ORAL_TABLET | ORAL | 5 refills | Status: DC

## 2017-06-12 MED ORDER — DULOXETINE HCL 60 MG PO CPEP: 60.0000 mg | ORAL_CAPSULE | Freq: Every day | ORAL | 5 refills | Status: DC

## 2017-06-12 MED ORDER — BUTALBITAL-APAP-CAFFEINE 50-325-40 MG PO TABS: ORAL_TABLET | ORAL | 1 refills | Status: DC

## 2017-06-12 MED ORDER — HYDROCODONE-ACETAMINOPHEN 7.5-325 MG PO TABS: 1.0000 | ORAL_TABLET | Freq: Four times a day (QID) | ORAL | 0 refills | Status: DC | PRN

## 2017-06-12 MED ORDER — ALCOHOL PADS 70 % PADS: MEDICATED_PAD | 1 refills | Status: DC

## 2017-06-12 MED ORDER — DEXLANSOPRAZOLE 60 MG PO CPDR: 1.0000 | DELAYED_RELEASE_CAPSULE | Freq: Every day | ORAL | 2 refills | Status: DC

## 2017-06-12 MED ORDER — TRAZODONE HCL 150 MG PO TABS: ORAL_TABLET | ORAL | 2 refills | Status: DC

## 2017-06-12 MED ORDER — ESCITALOPRAM OXALATE 20 MG PO TABS: ORAL_TABLET | ORAL | 5 refills | Status: DC

## 2017-06-12 NOTE — Addendum Note (Signed)
Addended by: Cleda Daub on: 06/12/2017 12:03 PM   Modules accepted: Orders

## 2017-06-12 NOTE — Patient Instructions (Signed)

## 2017-06-12 NOTE — Progress Notes (Signed)
Subjective:    Patient ID: [name], female    DOB: [birth year], 62 y.o.   MRN: [id number]  HPI  [name] is here today for follow up of chronic medical problem.  Outpatient Encounter Medications as of 06/12/2017  Medication Sig  . acyclovir ointment (ZOVIRAX) 5 % APPLY TO AFFECTED AREA EVERY 3 HOURS  . albuterol (PROVENTIL) (2.5 MG/3ML) 0.083% nebulizer solution USE 1 VIAL IN NEBULIZER EVERY 4 HOURS AS NEEDED FOR WHEEZING.  [name] albuterol (VENTOLIN HFA) 108 (90 Base) MCG/ACT inhaler INHALE 2 PUFFS EVERY 6 HOURS AS NEEDED FOR SHORTNESS OF BREATH AND WHEEZING.  . busPIRone (BUSPAR) 10 MG tablet TAKE (1) TABLET TWICE DAILY.  . busPIRone (BUSPAR) 5 MG tablet   . butalbital-acetaminophen-caffeine (FIORICET, ESGIC) 50-325-40 MG tablet TAKE 1 TABLET TWICE DAILY AS NEEDED FOR HEADACHE.  . carvedilol (COREG) 3.125 MG tablet If systolic <130 do not take, if systolic 130-150 take 1 tab bid, If systolic >150 take 2 tabs bid  . dexlansoprazole (DEXILANT) 60 MG capsule Take 1 capsule (60 mg total) by mouth daily.  . diazepam (VALIUM) 5 MG tablet TAKE 1 TABLET EVERY 12 HOURS AS NEEDED FOR ANXIETY.  . DULoxetine (CYMBALTA) 60 MG capsule Take 1 capsule (60 mg total) by mouth at bedtime.  [name] escitalopram (LEXAPRO) 20 MG tablet TAKE (1) TABLET BY MOUTH ONCE DAILY.  [name] estradiol (ESTRACE) 0.1 MG/GM vaginal cream PLACE 1 APPLICATORFUL VAGINALLY AT BEDTIME.  . fludrocortisone (FLORINEF) 0.1 MG tablet Take 1 tablet (0.1 mg total) by mouth daily.  . fluticasone (FLONASE) 50 MCG/ACT nasal spray Place 2 sprays into both nostrils daily.  . fluticasone furoate-vilanterol (BREO ELLIPTA) 200-25 MCG/INH AEPB Inhale 1 puff into the lungs daily.  . furosemide (LASIX) 40 MG tablet TAKE (1) TABLET BY MOUTH ONCE DAILY.  [name] HYDROcodone-acetaminophen (NORCO) 7.5-325 MG tablet Take 1 tablet by mouth every 6 (six) hours as needed for moderate pain.  [name] ipratropium (ATROVENT) 0.02 % nebulizer solution USE 1 VIAL IN  NEBULIZER EVERY 4 HOURS AS NEEDED FOR WHEEZING.  [name] lamoTRIgine (LAMICTAL) 150 MG tablet Take 1 tablet (150 mg total) by mouth daily.  [name] levETIRAcetam (KEPPRA) 500 MG tablet TAKE (1) TABLET TWICE DAILY.  [name] levocetirizine (XYZAL) 5 MG tablet Take 1 tablet (5 mg total) by mouth every evening.  . meloxicam (MOBIC) 15 MG tablet Take 1 tablet (15 mg total) by mouth daily. Take one tablet twice the first day then one tablet BID.  [name] miconazole (MICOTIN) 2 % cream Apply 1 application topically 2 (two) times daily.  . nitroGLYCERIN (NITROSTAT) 0.4 MG SL tablet PLACE ONE (1) TABLET UNDER TONGUE EVERY 5 MINUTES UP TO (3) DOSES AS NEEDED FOR CHEST PAIN.  [name] ondansetron (ZOFRAN) 4 MG tablet Take one to two tablets daily as needed for nausea.  [name] oxybutynin (DITROPAN) 5 MG tablet TAKE (1) TABLET TWICE DAILY.  . ranitidine (ZANTAC) 150 MG tablet TAKE 1 TABLET AT BEDTIME.  . rosuvastatin (CRESTOR) 10 MG tablet TAKE (1) TABLET BY MOUTH ONCE DAILY.  [name] sucralfate (CARAFATE) 1 GM/10ML suspension Take 10 mLs (1 g total) by mouth 2 (two) times daily.  [name] tiZANidine (ZANAFLEX) 4 MG tablet TAKE 1 TABLET EVERY 6 HOURS AS NEEDED FOR MUSCLE SPASM  . tolterodine (DETROL) 2 MG tablet TAKE (1) TABLET TWICE DAILY.  . traZODone (DESYREL) 150 MG tablet TAKE (1) TABLET DAILY AT BEDTIME.  . valACYclovir (VALTREX) 500 MG tablet TAKE (1) TABLET TWICE DAILY.     1. Mild  intermittent asthma without complication  Has chronic sob and wheezing  2. Obstructive sleep apnea syndrome  Does not wear CPAP nightyl- says she does not sleep well with it.  3. Gastroesophageal reflux disease with esophagitis  Takes dexilant which helps with symptoms  4. Age-related osteoporosis without current pathological fracture  Does no exercise  5. Anxiety state  Stays stress ed with her children and trying to help take care of her brother  6. GAD (generalized anxiety disorder)  Takes buspar which helps pretty good she said  7. Hyperlipidemia with  target LDL less than 100  Does not watch diet  8. Primary insomnia  Takes trazadone to sleep at night. Works most nights  9. Recurrent major depressive disorder, in partial remission (HCC)  Stays depressed- is on cymbalta and lexapro. Says she is ok today   10.    Chronic back pain           She has had for several years. Is on norco 7.5/325 but doe snot take every          day. Usually only takes at night  New complaints: None today  Social history: She has had a lot of recent hip pain and just had injections by Dr. Otelia Sergeant- has helped some with pain.  Living with her daughter right now.    Review of Systems  Constitutional: Negative for activity change and appetite change.  HENT: Negative.   Eyes: Negative for pain.  Respiratory: Negative for shortness of breath.   Cardiovascular: Negative for chest pain, palpitations and leg swelling.  Gastrointestinal: Negative for abdominal pain.  Endocrine: Negative for polydipsia.  Genitourinary: Negative.   Musculoskeletal: Positive for arthralgias (bil hip pain) and back pain.  Skin: Negative for rash.  Neurological: Negative for dizziness, weakness and headaches.  Hematological: Does not bruise/bleed easily.  Psychiatric/Behavioral: Negative.   All other systems reviewed and are negative.      Objective:   Physical Exam  Constitutional: She is oriented to person, place, and time. She appears well-developed and well-nourished.  HENT:  Nose: Nose normal.  Mouth/Throat: Oropharynx is clear and moist.  Eyes: EOM are normal.  Neck: Trachea normal, normal range of motion and full passive range of motion without pain. Neck supple. No JVD present. Carotid bruit is not present. No thyromegaly present.  Cardiovascular: Normal rate, regular rhythm, normal heart sounds and intact distal pulses. Exam reveals no gallop and no friction rub.  No murmur heard. Pulmonary/Chest: Effort normal and breath sounds normal.  Abdominal: Soft. Bowel  sounds are normal. She exhibits no distension and no mass. There is no tenderness.  Musculoskeletal: Normal range of motion.  (-) SLR bil Gait steady and slow FROM of lumbar spjne  With pain on flexion and extension  Lymphadenopathy:    She has no cervical adenopathy.  Neurological: She is alert and oriented to person, place, and time. She has normal reflexes.  Skin: Skin is warm and dry.  Psychiatric: She has a normal mood and affect. Her behavior is normal. Judgment and thought content normal.   BP (!) 164/86   Pulse 64   Temp (!) 97 F (36.1 C) (Oral)   Ht 5\' 2"  (1.575 m)   Wt 146 lb (66.2 kg)   BMI 26.70 kg/m         Assessment & Plan:  1. Mild intermittent asthma without complication Avoid allergens Avoid cigaretee smoke  2. Obstructive sleep apnea syndrome Does not have CPAP machine  3. Gastroesophageal  reflux disease with esophagitis Avoid spicy foods Do not eat 2 hours prior to bedtime - ranitidine (ZANTAC) 150 MG tablet; Take 1 tablet (150 mg total) by mouth at bedtime.  Dispense: 30 tablet; Refill: 5  4. Age-related osteoporosis without current pathological fracture Weight bearng exercises encouraged  5. Anxiety state Stress management cointinue buspar as rx  6. GAD (generalized anxiety disorder) stress management  7. Hyperlipidemia with target LDL less than 100 Low fta diet - rosuvastatin (CRESTOR) 10 MG tablet; TAKE (1) TABLET BY MOUTH ONCE DAILY.  Dispense: 30 tablet; Refill: 5  8. Primary insomnia Bedtime routine - traZODone (DESYREL) 150 MG tablet; TAKE (1) TABLET DAILY AT BEDTIME.  Dispense: 30 tablet; Refill: 2  9. Recurrent major depressive disorder, in partial remission (HCC)  10. Dysuria - Urinalysis, Complete  11. Chronic tension-type headache, not intractable - butalbital-acetaminophen-caffeine (FIORICET, ESGIC) 50-325-40 MG tablet; TAKE 1 TABLET TWICE DAILY AS NEEDED FOR HEADACHE.  Dispense: 20 tablet; Refill: 1  12. Essential  hypertension elevate legs when sitting - furosemide (LASIX) 40 MG tablet; TAKE (1) TABLET BY MOUTH ONCE DAILY.  Dispense: 90 tablet; Refill: 1  13. Moderate episode of recurrent major depressive disorder (HCC) - DULoxetine (CYMBALTA) 60 MG capsule; Take 1 capsule (60 mg total) by mouth at bedtime.  Dispense: 30 capsule; Refill: 5 - escitalopram (LEXAPRO) 20 MG tablet; TAKE (1) TABLET BY MOUTH ONCE DAILY.  Dispense: 30 tablet; Refill: 5  14. Seizures (HCC) - levETIRAcetam (KEPPRA) 500 MG tablet; TAKE (1) TABLET TWICE DAILY.  Dispense: 60 tablet; Refill: 5  15. Urinary urgency - dexlansoprazole (DEXILANT) 60 MG capsule; Take 1 capsule (60 mg total) by mouth daily.  Dispense: 30 capsule; Refill: 2 - tolterodine (DETROL) 2 MG tablet; TAKE (1) TABLET TWICE DAILY.  Dispense: 60 tablet; Refill: 5  16. Lumbosacral spondylosis without myelopathy - HYDROcodone-acetaminophen (NORCO) 7.5-325 MG tablet; Take 1 tablet by mouth every 6 (six) hours as needed for moderate pain.  Dispense: 30 tablet; Refill: 0 - HYDROcodone-acetaminophen (NORCO) 7.5-325 MG tablet; Take 1 tablet by mouth every 6 (six) hours as needed for moderate pain.  Dispense: 30 tablet; Refill: 0 - HYDROcodone-acetaminophen (NORCO) 7.5-325 MG tablet; Take 1 tablet by mouth every 6 (six) hours as needed for moderate pain.  Dispense: 30 tablet; Refill: 0    Labs pending Health maintenance reviewed Diet and exercise encouraged Continue all meds Follow up  In 6 months   Erica Daphine Deutscher, FNP

## 2017-06-13 LAB — CMP14+EGFR
ALT: 13 IU/L (ref 0–32)
AST: 16 IU/L (ref 0–40)
Albumin/Globulin Ratio: 1.5 (ref 1.2–2.2)
Albumin: 4.1 g/dL (ref 3.6–4.8)
Alkaline Phosphatase: 83 IU/L (ref 39–117)
BILIRUBIN TOTAL: 0.3 mg/dL (ref 0.0–1.2)
BUN/Creatinine Ratio: 12 (ref 12–28)
BUN: 10 mg/dL (ref 8–27)
CHLORIDE: 105 mmol/L (ref 96–106)
CO2: 25 mmol/L (ref 20–29)
Calcium: 9.3 mg/dL (ref 8.7–10.3)
Creatinine, Ser: 0.84 mg/dL (ref 0.57–1.00)
GFR calc non Af Amer: 76 mL/min/{1.73_m2} (ref >59)
GFR, EST AFRICAN AMERICAN: 87 mL/min/{1.73_m2} (ref >59)
GLOBULIN, TOTAL: 2.7 g/dL (ref 1.5–4.5)
Glucose: 75 mg/dL (ref 65–99)
POTASSIUM: 3.9 mmol/L (ref 3.5–5.2)
SODIUM: 143 mmol/L (ref 134–144)
Total Protein: 6.8 g/dL (ref 6.0–8.5)

## 2017-06-13 LAB — LIPID PANEL
Chol/HDL Ratio: 2.4 ratio (ref 0.0–4.4)
Cholesterol, Total: 159 mg/dL (ref 100–199)
HDL: 65 mg/dL (ref >39)
LDL Calculated: 76 mg/dL (ref 0–99)
TRIGLYCERIDES: 88 mg/dL (ref 0–149)
VLDL Cholesterol Cal: 18 mg/dL (ref 5–40)

## 2017-06-15 ENCOUNTER — Other Ambulatory Visit: Payer: Self-pay | Admitting: Nurse Practitioner

## 2017-06-17 ENCOUNTER — Ambulatory Visit (INDEPENDENT_AMBULATORY_CARE_PROVIDER_SITE_OTHER): Payer: 59 | Admitting: Specialist

## 2017-06-18 ENCOUNTER — Telehealth: Payer: Self-pay | Admitting: Nurse Practitioner

## 2017-06-18 ENCOUNTER — Telehealth: Payer: Self-pay

## 2017-06-18 MED ORDER — CIPROFLOXACIN HCL 500 MG PO TABS: 500.0000 mg | ORAL_TABLET | Freq: Two times a day (BID) | ORAL | 0 refills | Status: DC

## 2017-06-18 NOTE — Telephone Encounter (Signed)
Incoming refill request for Carafate 1GM/10 ML BID. Pt states that this has helped her so much. She is so happy with her results and would like to continue with current dose. Okay to refill?

## 2017-06-18 NOTE — Telephone Encounter (Signed)
Pt states she has abdominal discomfort and has a UTI but can't come to the office because she is in [place]. She would still like her rx sent to [facility] though if you send something in.

## 2017-06-19 MED ORDER — SUCRALFATE 1 GM/10ML PO SUSP
1.0000 g | Freq: Two times a day (BID) | ORAL | 6 refills | Status: DC
Start: 2017-06-19 — End: 2018-01-28

## 2017-06-19 NOTE — Telephone Encounter (Signed)
I am glad to hear it.  I have refilled it.

## 2017-06-24 ENCOUNTER — Ambulatory Visit: Payer: 59 | Admitting: Nurse Practitioner

## 2017-06-24 ENCOUNTER — Ambulatory Visit (INDEPENDENT_AMBULATORY_CARE_PROVIDER_SITE_OTHER): Payer: 59 | Admitting: Orthopaedic Surgery

## 2017-06-30 ENCOUNTER — Ambulatory Visit (INDEPENDENT_AMBULATORY_CARE_PROVIDER_SITE_OTHER): Payer: 59 | Admitting: *Deleted

## 2017-06-30 VITALS — BP 132/80 | HR 62 | Temp 98.0°F | Ht 62.0 in | Wt 144.8 lb

## 2017-06-30 DIAGNOSIS — Z0000 Encounter for general adult medical examination without abnormal findings: Secondary | ICD-10-CM | POA: Diagnosis not present

## 2017-06-30 NOTE — Progress Notes (Addendum)
Subjective:   [name] is a 62 y.o. female who presents for an Initial Medicare Annual Wellness Visit.  [name] has been on disability for approximately 15 years, she enjoys playing her guitar and singing.  She currently lives with her daughter and grandchildren and states that she has been moving from house to house for approximately 1 1/2 years.  [name] only eats one meal a day depending on when she is hungry and does not exercise due to disability.  She does not participate in any community involvement.  She has had one ED visit in the past year for abdominal pain.  Overall [name] feels that her health is worse than it was a year ago due to memory problems and hip pain.     Objective:    Today's Vitals   06/30/17 1015  BP: 132/80  Pulse: 62  Temp: 98 F (36.7 C)  TempSrc: Oral  Weight: 144 lb 12.8 oz (65.7 kg)  Height: 5\' 2"  (1.575 m)   Body mass index is 26.48 kg/m.  Advanced Directives 04/30/2017 12/12/2016 08/20/2016 12/20/2013 12/12/2013 09/19/2013 07/21/2013  Does Patient Have a Medical Advance Directive? No No No No No Patient does not have advance directive;Patient would not like information Patient does not have advance directive;Patient would not like information  Would patient like information on creating a medical advance directive? - - No - Patient declined No - patient declined information No - patient declined information - -  Pre-existing out of facility DNR order (yellow form or pink MOST form) - - - - - - -    Patient does not have advance directive at this time, information and packet given to completed and return signed and notarized copy back to office  Current Medications (verified) Outpatient Encounter Medications as of 06/30/2017  Medication Sig  . acyclovir ointment (ZOVIRAX) 5 % APPLY TO AFFECTED AREA EVERY 3 HOURS  . albuterol (PROVENTIL) (2.5 MG/3ML) 0.083% nebulizer solution USE 1 VIAL IN NEBULIZER EVERY 4 HOURS AS NEEDED FOR WHEEZING.  [name]  albuterol (VENTOLIN HFA) 108 (90 Base) MCG/ACT inhaler INHALE 2 PUFFS EVERY 6 HOURS AS NEEDED FOR SHORTNESS OF BREATH AND WHEEZING.  [name] Alcohol Swabs (ALCOHOL PADS) 70 % PADS 1 pad daily to check blood sugars  . busPIRone (BUSPAR) 10 MG tablet TAKE (1) TABLET TWICE DAILY.  . butalbital-acetaminophen-caffeine (FIORICET, ESGIC) 50-325-40 MG tablet TAKE 1 TABLET TWICE DAILY AS NEEDED FOR HEADACHE.  . carvedilol (COREG) 3.125 MG tablet If systolic <130 do not take, if systolic 130-150 take 1 tab bid, If systolic >150 take 2 tabs bid  . dexlansoprazole (DEXILANT) 60 MG capsule Take 1 capsule (60 mg total) by mouth daily.  . diazepam (VALIUM) 5 MG tablet TAKE 1 TABLET EVERY 12 HOURS AS NEEDED FOR ANXIETY.  . DULoxetine (CYMBALTA) 60 MG capsule Take 1 capsule (60 mg total) by mouth at bedtime.  [name] escitalopram (LEXAPRO) 20 MG tablet TAKE (1) TABLET BY MOUTH ONCE DAILY.  [name] estradiol (ESTRACE) 0.1 MG/GM vaginal cream PLACE 1 APPLICATORFUL VAGINALLY AT BEDTIME.  . fludrocortisone (FLORINEF) 0.1 MG tablet Take 1 tablet (0.1 mg total) by mouth daily.  . fluticasone (FLONASE) 50 MCG/ACT nasal spray Place 2 sprays into both nostrils daily.  . fluticasone furoate-vilanterol (BREO ELLIPTA) 200-25 MCG/INH AEPB Inhale 1 puff into the lungs daily.  . furosemide (LASIX) 40 MG tablet TAKE (1) TABLET BY MOUTH ONCE DAILY.  [name] glucose blood (ONETOUCH VERIO) test strip Test blood sugar daily  .  HYDROcodone-acetaminophen (NORCO) 7.5-325 MG tablet Take 1 tablet by mouth every 6 (six) hours as needed for moderate pain.  Melene Muller ON 07/12/2017] HYDROcodone-acetaminophen (NORCO) 7.5-325 MG tablet Take 1 tablet by mouth every 6 (six) hours as needed for moderate pain.  Melene Muller ON 08/12/2017] HYDROcodone-acetaminophen (NORCO) 7.5-325 MG tablet Take 1 tablet by mouth every 6 (six) hours as needed for moderate pain.  [name] ipratropium (ATROVENT) 0.02 % nebulizer solution USE 1 VIAL IN NEBULIZER EVERY 4 HOURS AS NEEDED FOR WHEEZING.  [name]  lamoTRIgine (LAMICTAL) 150 MG tablet Take 1 tablet (150 mg total) by mouth daily.  [name] levETIRAcetam (KEPPRA) 500 MG tablet TAKE (1) TABLET TWICE DAILY.  [name] levocetirizine (XYZAL) 5 MG tablet Take 1 tablet (5 mg total) by mouth every evening.  . meloxicam (MOBIC) 15 MG tablet Take 1 tablet (15 mg total) by mouth daily. Take one tablet twice the first day then one tablet BID.  [name] miconazole (MICOTIN) 2 % cream Apply 1 application topically 2 (two) times daily.  . nitroGLYCERIN (NITROSTAT) 0.4 MG SL tablet PLACE ONE (1) TABLET UNDER TONGUE EVERY 5 MINUTES UP TO (3) DOSES AS NEEDED FOR CHEST PAIN.  [name] ondansetron (ZOFRAN) 4 MG tablet Take one to two tablets daily as needed for nausea.  [name] DELICA LANCETS 33G MISC TEST BLOOD SUGAR ONCE DAILY.  [name] oxybutynin (DITROPAN) 5 MG tablet TAKE (1) TABLET TWICE DAILY.  . ranitidine (ZANTAC) 150 MG tablet Take 1 tablet (150 mg total) by mouth at bedtime.  . rosuvastatin (CRESTOR) 10 MG tablet TAKE (1) TABLET BY MOUTH ONCE DAILY.  [name] sucralfate (CARAFATE) 1 GM/10ML suspension Take 10 mLs (1 g total) by mouth 2 (two) times daily.  [name] tiZANidine (ZANAFLEX) 4 MG tablet TAKE 1 TABLET EVERY 6 HOURS AS NEEDED FOR MUSCLE SPASM  . tolterodine (DETROL) 2 MG tablet TAKE (1) TABLET TWICE DAILY.  . traZODone (DESYREL) 150 MG tablet TAKE (1) TABLET DAILY AT BEDTIME.  . valACYclovir (VALTREX) 500 MG tablet TAKE (1) TABLET TWICE DAILY.  . [DISCONTINUED] ciprofloxacin (CIPRO) 500 MG tablet Take 1 tablet (500 mg total) by mouth 2 (two) times daily. (Patient not taking: Reported on 06/30/2017)   Facility-Administered Encounter Medications as of 06/30/2017  Medication  . triamcinolone acetonide (KENALOG-40) injection 40 mg  . triamcinolone acetonide (KENALOG-40) injection 40 mg    Allergies (verified) Codeine; Morphine and related; Ambien [zolpidem tartrate]; Lyrica [pregabalin]; and Neurontin [gabapentin]   History: Past Medical History:  Diagnosis Date  . Anginal pain (HCC)     last time   . Anxiety   . Arthritis    RHEUMATOID  . Asthma   . Bipolar 1 disorder (HCC)   . COPD (chronic obstructive pulmonary disease) (HCC)   . Coronary artery disease    reported hx of "MI";  Echo 2009 with normal LVF;  Myoview 05/2011: no ischemia  . Depression   . Dyslipidemia   . Esophageal stricture   . Fibromyalgia   . GERD (gastroesophageal reflux disease)   . H/O hiatal hernia   . Head injury, unspecified   . Herpes simplex infection   . History of kidney stones   . Hyperlipidemia   . Hypertension   . Insomnia   . Myocardial infarction (HCC)   . Osteoporosis   . Pneumonia    hx  . Seizures (HCC)    last 3 weeks ago- prescribed keppra -never took didnt like side effects read about online  . Shortness of breath   . Sleep  apnea    ? neg  . Spinal stenosis of lumbar region   . Spondylolisthesis   . Status post placement of implantable loop recorder   . Supraventricular tachycardia (HCC)   . Syncope and collapse    s/p ILR; no arhythmogenic cause identified  . UTI (lower urinary tract infection)    Past Surgical History:  Procedure Laterality Date  . BACK SURGERY    . BREAST SURGERY     lumpectomy  . CHOLECYSTECTOMY    . CYSTOSCOPY     stone  . DOPPLER ECHOCARDIOGRAPHY  2009  . head up tilt table testing  06/15/2007   [name]  . HEMORRHOID SURGERY    . insertion of implatable loop recorder  08/11/2007   [name]  . POSTERIOR CERVICAL FUSION/FORAMINOTOMY N/A 12/19/2013   Procedure: RIGHT C3-4.C4-5 AND C5-6 FORAMINOTOMIES;  Surgeon: Kerrin Champagne, MD;  Location: [facility] OR;  Service: Orthopedics;  Laterality: N/A;  . TUBAL LIGATION     Patient id due to have Cataracts surgery on 07/31/2017 and 08/14/2017  Family History  Problem Relation Age of Onset  . Heart attack Father   . Cancer Father   . Mental illness Father   . Cancer Mother   . Mental illness Mother   . Heart attack Brother        stents  . Alcohol abuse Brother   . Heart disease  Brother   . Drug abuse Brother   . Diabetes Brother   . Colon cancer Maternal Aunt   . Cirrhosis Brother   . Stomach cancer Neg Hx    Social History   Socioeconomic History  . Marital status: Single    Spouse name: Not on file  . Number of children: 6  . Years of education: Not on file  . Highest education level: 6th grade  Occupational History  . Occupation: Disability    Comment: 15 years  Social Needs  . Financial resource strain: Somewhat hard  . Food insecurity:    Worry: Sometimes true    Inability: Sometimes true  . Transportation needs:    Medical: No    Non-medical: No  Tobacco Use  . Smoking status: Never Smoker  . Smokeless tobacco: Never Used  Substance and Sexual Activity  . Alcohol use: No    Alcohol/week: 0.0 oz  . Drug use: No  . Sexual activity: Yes  Lifestyle  . Physical activity:    Days per week: 0 days    Minutes per session: 0 min  . Stress: To some extent  Relationships  . Social connections:    Talks on phone: More than three times a week    Gets together: More than three times a week    Attends religious service: Never    Active member of club or organization: No    Attends meetings of clubs or organizations: Never    Relationship status: Patient refused  Other Topics Concern  . Not on file  Social History Narrative   Patient lives with Daughter   Patient is on disability.    Patient has 8th grade education.    Patient has 6 children, 20 grand-chilren, and 3 great-grand children.     Tobacco Counseling Patient has never been smoker, No counseling needed  Activities of Daily Living In your present state of health, do you have any difficulty performing the following activities: 06/30/2017 08/20/2016  Hearing? N N  Vision? Y Y  Comment Cataracts, Surgery 07/31/2017 and 08/14/2017 cataracts but  they are less than 60% so insurance won't cover surgery  Difficulty concentrating or making decisions? Y Y  Comment - head injury as a child.  Long term memory problems   Walking or climbing stairs? Y Y  Comment - uses handrails  Dressing or bathing? N N  Doing errands, shopping? N N  Preparing Food and eating ? - N  Using the Toilet? - N  In the past six months, have you accidently leaked urine? - Y  Comment - on medication for bladder control issues  Do you have problems with loss of bowel control? - N  Managing your Medications? - N  Managing your Finances? - N  Housekeeping or managing your Housekeeping? 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 Influenza Whole 12/19/2011  . Influenza,inj,Quad PF,6+ Mos 01/25/2013, 01/05/2014, 02/12/2016, 05/07/2017  . Pneumococcal Conjugate-13 01/05/2014  . Pneumococcal Polysaccharide-23 11/30/2011  . Tdap 08/05/2011  . Zoster 01/02/2012  Declines shingrix at this time Declines Mammogram  There are no preventive care reminders to display for this patient.  Patient Care Team: Bennie Pierini, FNP as PCP - General (Nurse Practitioner) Kerrin Champagne, MD as Consulting Physician (Orthopedic Surgery) Marinus Maw, MD as Consulting Physician (Cardiology) Wyline Mood Dorothe Pea, MD as Consulting Physician (Cardiology)      Assessment:   This is a routine wellness examination for [facility]  Hearing/Vision screen Recent Eye Exam Done.  Cataracts surgery on 07/31/17 and 08/14/17 No trouble hearing at this time. Dietary issues and exercise activities discussed: Current Exercise Habits: The patient does not participate in regular exercise at present, Exercise limited by: None identified Does not exercise due to disability Goals    . Exercise 3x per week (30 min per time)     Increase activity level as  tolerated. Aim for 30 minutes of light walking 3 times a week. Walk with a partner.   Do chair exercises daily. Refer to handouts.      Also encouraged to eat 3 meals daily that consist of protein, fruits and vegetables  Depression Screen PHQ 2/9 Scores 06/30/2017 04/21/2017 04/16/2017 03/05/2017 01/19/2017 12/15/2016 12/15/2016  PHQ - 2 Score 1 0 0 0 0 0 0  PHQ- 9 Score - - - - - - -    Fall Risk Fall Risk  06/30/2017 04/21/2017 04/16/2017 03/05/2017 01/19/2017  Falls in the past year? No No No No No  Number falls in past yr: - - - - -  Comment - - - - -  Injury with Fall? - - - - -  Risk Factor Category  - - - - -  Risk for fall due to : - - - - -  Risk for fall due to: Comment - - - - -  Follow up - - - - -    Is the patient's home free of loose throw rugs in walkways, pet beds, electrical cords, etc?   yes      Grab bars in the bathroom? no      Handrails on the stairs?   no      Adequate lighting?   yes  Timed Get Up and Go Performed   Cognitive Function: MMSE - Mini Mental State Exam 06/30/2017 08/20/2016  Orientation  to time 4 4  Orientation to Place 5 5  Registration 3 3  Attention/ Calculation 0 5  Recall 1 0  Language- name 2 objects 2 2  Language- repeat 1 1  Language- follow 3 step command 3 3  Language- read & follow direction 1 1  Write a sentence 1 1  Copy design 1 1  Total score 22 26    Some memory loss noted due to childhood injury    Screening Tests Health Maintenance  Topic Date Due  . INFLUENZA VACCINE  10/29/2017  . PAP SMEAR  10/15/2019  . TETANUS/TDAP  08/04/2021  . COLONOSCOPY  04/26/2022  . Hepatitis C Screening  Completed  . HIV Screening  Completed  . MAMMOGRAM  Discontinued  Declines Mammogram and states that she does not want one  Qualifies for Shingles Vaccine? Yes, Declines at this time  Cancer Screenings: Lung: Low Dose CT Chest recommended if Age 25-80 years, 30 pack-year currently smoking OR have quit w/in 15years. Patient does not  qualify. Breast: Up to date on Mammogram? No   Up to date of Bone Density/Dexa? No Colorectal: Yes   Additional Screenings: Hepatitis C Screening:  Done 11/10/14     Plan:   Encouraged [name] to eat 3 meals daily that consist of proteins, fruits and vegetables. Encouraged to attempt to exercise for at least 30 minutes daily.  Explained the importance of mammogram and shingrix vaccine.  Explained the importance of advance directive, packet and information given. Return signed copy to office.  Encourage to keep follow up appt.   I have personally reviewed and noted the following in the patient's chart:   . Medical and social history . Use of alcohol, tobacco or illicit drugs  . Current medications and supplements . Functional ability and status . Nutritional status . Physical activity . Advanced directives . List of other physicians . Hospitalizations, surgeries, and ER visits in previous 12 months . Vitals . Screenings to include cognitive, depression, and falls . Referrals and appointments  In addition, I have reviewed and discussed with patient certain preventive protocols, quality metrics, and best practice recommendations. A written personalized care plan for preventive services as well as general preventive health recommendations were provided to patient.      Caryl Bis, LPN   04/05/1094    I have reviewed and agree with the above AWV documentation.   [name] Daphine Deutscher, FNP

## 2017-06-30 NOTE — Patient Instructions (Addendum)
Keep Follow up appointment with [name] Daphine Deutscher, FNP on 09/14/2017 at 10:30am Eat 3 meals daily Try to exercise for at least 30 Minutes daily Review Advance Directive    [name] , Thank you for taking time to come for your Medicare Wellness Visit. I appreciate your ongoing commitment to your health goals. Please review the following plan we discussed and let me know if I can assist you in the future.   These are the goals we discussed: Goals    . Exercise 3x per week (30 min per time)     Increase activity level as tolerated. Aim for 30 minutes of light walking 3 times a week. Walk with a partner.   Do chair exercises daily. Refer to handouts.        This is a list of the screening recommended for you and due dates:  Health Maintenance  Topic Date Due  . Mammogram  07/20/2017*  . Flu Shot  10/29/2017  . Pap Smear  10/15/2019  . Tetanus Vaccine  08/04/2021  . Colon Cancer Screening  04/26/2022  .  Hepatitis C: One time screening is recommended by Center for Disease Control  (CDC) for  adults born from [birth year] through 1964.   Completed  . HIV Screening  Completed  *Topic was postponed. The date shown is not the original due date.    Advance Directive Advance directives are legal documents that let you make choices ahead of time about your health care and medical treatment in case you become unable to communicate for yourself. Advance directives are a way for you to communicate your wishes to family, friends, and health care providers. This can help convey your decisions about end-of-life care if you become unable to communicate. Discussing and writing advance directives should happen over time rather than all at once. Advance directives can be changed depending on your situation and what you want, even after you have signed the advance directives. If you do not have an advance directive, some states assign family decision makers to act on your behalf based on how closely you are  related to them. Each state has its own laws regarding advance directives. You may want to check with your health care provider, attorney, or state representative about the laws in your state. There are different types of advance directives, such as:  Medical power of attorney.  Living will.  Do not resuscitate (DNR) or do not attempt resuscitation (DNAR) order.  Health care proxy and medical power of attorney A health care proxy, also called a health care agent, is a person who is appointed to make medical decisions for you in cases in which you are unable to make the decisions yourself. Generally, people choose someone they know well and trust to represent their preferences. Make sure to ask this person for an agreement to act as your proxy. A proxy may have to exercise judgment in the event of a medical decision for which your wishes are not known. A medical power of attorney is a legal document that names your health care proxy. Depending on the laws in your state, after the document is written, it may also need to be:  Signed.  Notarized.  Dated.  Copied.  Witnessed.  Incorporated into your medical record.  You may also want to appoint someone to manage your financial affairs in a situation in which you are unable to do so. This is called a durable power of attorney for finances. It is a separate legal  document from the durable power of attorney for health care. You may choose the same person or someone different from your health care proxy to act as your agent in financial matters. If you do not appoint a proxy, or if there is a concern that the proxy is not acting in your best interests, a court-appointed guardian may be designated to act on your behalf. Living will A living will is a set of instructions documenting your wishes about medical care when you cannot express them yourself. Health care providers should keep a copy of your living will in your medical record. You may want  to give a copy to family members or friends. To alert caregivers in case of an emergency, you can place a card in your wallet to let them know that you have a living will and where they can find it. A living will is used if you become:  Terminally ill.  Incapacitated.  Unable to communicate or make decisions.  Items to consider in your living will include:  The use or non-use of life-sustaining equipment, such as dialysis machines and breathing machines (ventilators).  A DNR or DNAR order, which is the instruction not to use cardiopulmonary resuscitation (CPR) if breathing or heartbeat stops.  The use or non-use of tube feeding.  Withholding of food and fluids.  Comfort (palliative) care when the goal becomes comfort rather than a cure.  Organ and tissue donation.  A living will does not give instructions for distributing your money and property if you should pass away. It is recommended that you seek the advice of a lawyer when writing a will. Decisions about taxes, beneficiaries, and asset distribution will be legally binding. This process can relieve your family and friends of any concerns surrounding disputes or questions that may come up about the distribution of your assets. DNR or DNAR A DNR or DNAR order is a request not to have CPR in the event that your heart stops beating or you stop breathing. If a DNR or DNAR order has not been made and shared, a health care provider will try to help any patient whose heart has stopped or who has stopped breathing. If you plan to have surgery, talk with your health care provider about how your DNR or DNAR order will be followed if problems occur. Summary  Advance directives are the legal documents that allow you to make choices ahead of time about your health care and medical treatment in case you become unable to communicate for yourself.  The process of discussing and writing advance directives should happen over time. You can change the  advance directives, even after you have signed them.  Advance directives include DNR or DNAR orders, living wills, and designating an agent as your medical power of attorney. This information is not intended to replace advice given to you by your health care provider. Make sure you discuss any questions you have with your health care provider. Document Released: 06/24/2007 Document Revised: 02/04/2016 Document Reviewed: 02/04/2016 Elsevier Interactive Patient Education  2017 [employer].

## 2017-07-01 ENCOUNTER — Other Ambulatory Visit: Payer: Self-pay | Admitting: Nurse Practitioner

## 2017-07-01 DIAGNOSIS — J3089 Other allergic rhinitis: Secondary | ICD-10-CM

## 2017-07-03 ENCOUNTER — Other Ambulatory Visit: Payer: Self-pay | Admitting: *Deleted

## 2017-07-03 MED ORDER — TIZANIDINE HCL 4 MG PO TABS: ORAL_TABLET | ORAL | 2 refills | Status: DC

## 2017-07-22 NOTE — Patient Instructions (Signed)
Your procedure is scheduled on: 07/31/2017   Report to [facility] at  640   AM.  Call this number if you have problems the morning of surgery: [phone]   Do not eat food or drink liquids :After Midnight.      Take these medicines the morning of surgery with A SIP OF WATER: buspar, fioricet (if needed), coreg, dexilant, valium, cymbalta, lexapro, norco or hydrocodone (if needed), keppra, xyzal, mobic or zanaflex ( if needed), ditropan, detrol. Use your inhalers and your nebulizer before you come.   Do not wear jewelry, make-up or nail polish.  Do not wear lotions, powders, or perfumes. You may wear deodorant.  Do not shave 48 hours prior to surgery.  Do not bring valuables to the hospital.  Contacts, dentures or bridgework may not be worn into surgery.  Leave suitcase in the car. After surgery it may be brought to your room.  For patients admitted to the hospital, checkout time is 11:00 AM the day of discharge.   Patients discharged the day of surgery will not be allowed to drive home.  :     Please read over the following fact sheets that you were given: Coughing and Deep Breathing, Surgical Site Infection Prevention, Anesthesia Post-op Instructions and Care and Recovery After Surgery    Cataract A cataract is a clouding of the lens of the eye. When a lens becomes cloudy, vision is reduced based on the degree and nature of the clouding. Many cataracts reduce vision to some degree. Some cataracts make people more near-sighted as they develop. Other cataracts increase glare. Cataracts that are ignored and become worse can sometimes look white. The white color can be seen through the pupil. CAUSES   Aging. However, cataracts may occur at any age, even in newborns.   Certain drugs.   Trauma to the eye.   Certain diseases such as diabetes.   Specific eye diseases such as chronic inflammation inside the eye or a sudden attack of a rare form of glaucoma.   Inherited or acquired medical  problems.  SYMPTOMS   Gradual, progressive drop in vision in the affected eye.   Severe, rapid visual loss. This most often happens when trauma is the cause.  DIAGNOSIS  To detect a cataract, an eye doctor examines the lens. Cataracts are best diagnosed with an exam of the eyes with the pupils enlarged (dilated) by drops.  TREATMENT  For an early cataract, vision may improve by using different eyeglasses or stronger lighting. If that does not help your vision, surgery is the only effective treatment. A cataract needs to be surgically removed when vision loss interferes with your everyday activities, such as driving, reading, or watching TV. A cataract may also have to be removed if it prevents examination or treatment of another eye problem. Surgery removes the cloudy lens and usually replaces it with a substitute lens (intraocular lens, IOL).  At a time when both you and your doctor agree, the cataract will be surgically removed. If you have cataracts in both eyes, only one is usually removed at a time. This allows the operated eye to heal and be out of danger from any possible problems after surgery (such as infection or poor wound healing). In rare cases, a cataract may be doing damage to your eye. In these cases, your caregiver may advise surgical removal right away. The vast majority of people who have cataract surgery have better vision afterward. HOME CARE INSTRUCTIONS  If you are  not planning surgery, you may be asked to do the following:  Use different eyeglasses.   Use stronger or brighter lighting.   Ask your eye doctor about reducing your medicine dose or changing medicines if it is thought that a medicine caused your cataract. Changing medicines does not make the cataract go away on its own.   Become familiar with your surroundings. Poor vision can lead to injury. Avoid bumping into things on the affected side. You are at a higher risk for tripping or falling.   Exercise extreme  care when driving or operating machinery.   Wear sunglasses if you are sensitive to bright light or experiencing problems with glare.  SEEK IMMEDIATE MEDICAL CARE IF:   You have a worsening or sudden vision loss.   You notice redness, swelling, or increasing pain in the eye.   You have a fever.  Document Released: 03/17/2005 Document Revised: 03/06/2011 Document Reviewed: 11/08/2010 [facility] Patient Information 2012 [place], [place].PATIENT INSTRUCTIONS POST-ANESTHESIA  IMMEDIATELY FOLLOWING SURGERY:  Do not drive or operate machinery for the first twenty four hours after surgery.  Do not make any important decisions for twenty four hours after surgery or while taking narcotic pain medications or sedatives.  If you develop intractable nausea and vomiting or a severe headache please notify your doctor immediately.  FOLLOW-UP:  Please make an appointment with your surgeon as instructed. You do not need to follow up with anesthesia unless specifically instructed to do so.  WOUND CARE INSTRUCTIONS (if applicable):  Keep a dry clean dressing on the anesthesia/puncture wound site if there is drainage.  Once the wound has quit draining you may leave it open to air.  Generally you should leave the bandage intact for twenty four hours unless there is drainage.  If the epidural site drains for more than 36-48 hours please call the anesthesia department.  QUESTIONS?:  Please feel free to call your physician or the hospital operator if you have any questions, and they will be happy to assist you.

## 2017-07-24 ENCOUNTER — Other Ambulatory Visit: Payer: Self-pay

## 2017-07-24 ENCOUNTER — Encounter (HOSPITAL_COMMUNITY): Payer: Self-pay

## 2017-07-24 ENCOUNTER — Other Ambulatory Visit: Payer: Self-pay | Admitting: Nurse Practitioner

## 2017-07-24 ENCOUNTER — Encounter (HOSPITAL_COMMUNITY)
Admission: RE | Admit: 2017-07-24 | Discharge: 2017-07-24 | Disposition: A | Discharge status: Home / Self Care | Payer: 59 | Source: Ambulatory Visit | Attending: Ophthalmology | Admitting: Ophthalmology

## 2017-07-24 DIAGNOSIS — Z01812 Encounter for preprocedural laboratory examination: Secondary | ICD-10-CM | POA: Diagnosis present

## 2017-07-24 DIAGNOSIS — N941 Unspecified dyspareunia: Secondary | ICD-10-CM

## 2017-07-24 HISTORY — DX: Cardiac arrhythmia, unspecified: I49.9

## 2017-07-24 LAB — CBC WITH DIFFERENTIAL/PLATELET
BASOS ABS: 0 10*3/uL (ref 0.0–0.1)
BASOS PCT: 0 %
EOS PCT: 2 %
Eosinophils Absolute: 0.1 10*3/uL (ref 0.0–0.7)
HCT: 46.5 % — ABNORMAL HIGH (ref 36.0–46.0)
Hemoglobin: 14.1 g/dL (ref 12.0–15.0)
Lymphocytes Relative: 31 %
Lymphs Abs: 3 10*3/uL (ref 0.7–4.0)
MCH: 28.8 pg (ref 26.0–34.0)
MCHC: 30.3 g/dL (ref 30.0–36.0)
MCV: 95.1 fL (ref 78.0–100.0)
Monocytes Absolute: 1 10*3/uL (ref 0.1–1.0)
Monocytes Relative: 11 %
Neutro Abs: 5.3 10*3/uL (ref 1.7–7.7)
Neutrophils Relative %: 56 %
PLATELETS: 246 10*3/uL (ref 150–400)
RBC: 4.89 MIL/uL (ref 3.87–5.11)
RDW: 14 % (ref 11.5–15.5)
WBC: 9.5 10*3/uL (ref 4.0–10.5)

## 2017-07-24 LAB — BASIC METABOLIC PANEL
ANION GAP: 13 (ref 5–15)
BUN: 14 mg/dL (ref 6–20)
CALCIUM: 9.6 mg/dL (ref 8.9–10.3)
CO2: 29 mmol/L (ref 22–32)
CREATININE: 1.02 mg/dL — AB (ref 0.44–1.00)
Chloride: 101 mmol/L (ref 101–111)
GFR, EST NON AFRICAN AMERICAN: 59 mL/min — AB (ref >60)
GLUCOSE: 102 mg/dL — AB (ref 65–99)
Potassium: 3.7 mmol/L (ref 3.5–5.1)
Sodium: 143 mmol/L (ref 135–145)

## 2017-07-24 LAB — HEMOGLOBIN A1C
Hgb A1c MFr Bld: 5.8 % — ABNORMAL HIGH (ref 4.8–5.6)
Mean Plasma Glucose: 119.76 mg/dL

## 2017-07-24 LAB — GLUCOSE, CAPILLARY: Glucose-Capillary: 105 mg/dL — ABNORMAL HIGH (ref 65–99)

## 2017-07-24 NOTE — Telephone Encounter (Signed)
Last seen 06/12/17  MMM 

## 2017-07-27 ENCOUNTER — Telehealth: Payer: Self-pay | Admitting: Nurse Practitioner

## 2017-07-27 NOTE — Pre-Procedure Instructions (Signed)
HgbA1C routed to PCP. 

## 2017-07-27 NOTE — Telephone Encounter (Signed)
NTBS need to look at urine

## 2017-07-27 NOTE — Telephone Encounter (Signed)
Patient notified that she ntbs and appt made for tomorrow

## 2017-07-27 NOTE — Telephone Encounter (Signed)
Please review and advise.

## 2017-07-28 ENCOUNTER — Ambulatory Visit (INDEPENDENT_AMBULATORY_CARE_PROVIDER_SITE_OTHER): Payer: 59 | Admitting: Nurse Practitioner

## 2017-07-28 ENCOUNTER — Encounter: Payer: Self-pay | Admitting: Nurse Practitioner

## 2017-07-28 ENCOUNTER — Other Ambulatory Visit: Payer: Self-pay | Admitting: Cardiology

## 2017-07-28 VITALS — BP 120/86 | HR 85 | Temp 98.1°F | Ht 62.0 in | Wt 138.0 lb

## 2017-07-28 DIAGNOSIS — R300 Dysuria: Secondary | ICD-10-CM | POA: Diagnosis not present

## 2017-07-28 DIAGNOSIS — B9689 Other specified bacterial agents as the cause of diseases classified elsewhere: Secondary | ICD-10-CM

## 2017-07-28 DIAGNOSIS — N760 Acute vaginitis: Secondary | ICD-10-CM

## 2017-07-28 DIAGNOSIS — N898 Other specified noninflammatory disorders of vagina: Secondary | ICD-10-CM | POA: Diagnosis not present

## 2017-07-28 LAB — MICROSCOPIC EXAMINATION: Renal Epithel, UA: NONE SEEN /hpf

## 2017-07-28 LAB — URINALYSIS, COMPLETE
Bilirubin, UA: NEGATIVE
GLUCOSE, UA: NEGATIVE
Nitrite, UA: NEGATIVE
RBC, UA: NEGATIVE
Specific Gravity, UA: 1.025 (ref 1.005–1.030)
Urobilinogen, Ur: 0.2 mg/dL (ref 0.2–1.0)
pH, UA: 5.5 (ref 5.0–7.5)

## 2017-07-28 LAB — WET PREP FOR TRICH, YEAST, CLUE
Clue Cell Exam: POSITIVE — AB
Trichomonas Exam: NEGATIVE
YEAST EXAM: NEGATIVE

## 2017-07-28 MED ORDER — METRONIDAZOLE 500 MG PO TABS
500.0000 mg | ORAL_TABLET | Freq: Two times a day (BID) | ORAL | 0 refills | Status: DC
Start: 2017-07-28 — End: 2017-09-14

## 2017-07-28 NOTE — Progress Notes (Signed)
   Subjective:    Patient ID: [name], female    DOB: 1955-06-27, 62 y.o.   MRN: [id number]  HPI  Patient comes in today c/o suprapubic pain bil with some dysuria. She has also had some perineal itching for the last several days. She is due to cataract surgery Friday and does not want any complications.   Review of Systems  Constitutional: Negative.   Respiratory: Negative.   Cardiovascular: Negative.   Genitourinary: Positive for dysuria, frequency, pelvic pain and urgency.  Neurological: Negative.   Psychiatric/Behavioral: Negative.   All other systems reviewed and are negative.      Objective:   Physical Exam  Constitutional: She appears well-developed and well-nourished.  Cardiovascular: Normal rate.  Pulmonary/Chest: Effort normal.  Abdominal: Soft. Bowel sounds are normal. There is tenderness (mild suprapubic pain).  Genitourinary:  Genitourinary Comments: Self wet prep   BP 120/86 (Cuff Size: Normal)   Pulse 85   Temp 98.1 F (36.7 C) (Oral)   Ht 5\' 2"  (1.575 m)   Wt 138 lb (62.6 kg)   BMI 25.24 kg/m   Urine looks okay Wet prep positive clue cells       Assessment & Plan:   1. Dysuria   2. Vaginal irritation   3. Bacterial vaginosis    Meds ordered this encounter  Medications  . metroNIDAZOLE (FLAGYL) 500 MG tablet    Sig: Take 1 tablet (500 mg total) by mouth 2 (two) times daily.    Dispense:  14 tablet    Refill:  0    Order Specific Question:   Supervising Provider    Answer:   [[id number]]   No douching' avoid bubble baths RTO prn  [name] Johna Sheriff, FNP

## 2017-07-28 NOTE — Patient Instructions (Signed)
Bacterial Vaginosis Bacterial vaginosis is an infection of the vagina. It happens when too many germs (bacteria) grow in the vagina. This infection puts you at risk for infections from sex (STIs). Treating this infection can lower your risk for some STIs. You should also treat this if you are pregnant. It can cause your baby to be born early. Follow these instructions at home: Medicines  Take over-the-counter and prescription medicines only as told by your doctor.  Take or use your antibiotic medicine as told by your doctor. Do not stop taking or using it even if you start to feel better. General instructions  If you your sexual partner is a woman, tell her that you have this infection. She needs to get treatment if she has symptoms. If you have a female partner, he does not need to be treated.  During treatment: ? Avoid sex. ? Do not douche. ? Avoid alcohol as told. ? Avoid breastfeeding as told.  Drink enough fluid to keep your pee (urine) clear or pale yellow.  Keep your vagina and butt (rectum) clean. ? Wash the area with warm water every day. ? Wipe from front to back after you use the toilet.  Keep all follow-up visits as told by your doctor. This is important. Preventing this condition  Do not douche.  Use only warm water to wash around your vagina.  Use protection when you have sex. This includes: ? Latex condoms. ? Dental dams.  Limit how many people you have sex with. It is best to only have sex with the same person (be monogamous).  Get tested for STIs. Have your partner get tested.  Wear underwear that is cotton or lined with cotton.  Avoid tight pants and pantyhose. This is most important in summer.  Do not use any products that have nicotine or tobacco in them. These include cigarettes and e-cigarettes. If you need help quitting, ask your doctor.  Do not use illegal drugs.  Limit how much alcohol you drink. Contact a doctor if:  Your symptoms do not get  better, even after you are treated.  You have more discharge or pain when you pee (urinate).  You have a fever.  You have pain in your belly (abdomen).  You have pain with sex.  Your bleed from your vagina between periods. Summary  This infection happens when too many germs (bacteria) grow in the vagina.  Treating this condition can lower your risk for some infections from sex (STIs).  You should also treat this if you are pregnant. It can cause early (premature) birth.  Do not stop taking or using your antibiotic medicine even if you start to feel better. This information is not intended to replace advice given to you by your health care provider. Make sure you discuss any questions you have with your health care provider. Document Released: 12/25/2007 Document Revised: 12/01/2015 Document Reviewed: 12/01/2015 Elsevier Interactive Patient Education  2017 [employer]  

## 2017-07-29 DIAGNOSIS — H269 Unspecified cataract: Secondary | ICD-10-CM

## 2017-07-29 HISTORY — DX: Unspecified cataract: H26.9

## 2017-07-31 ENCOUNTER — Encounter (HOSPITAL_COMMUNITY): Payer: Self-pay | Admitting: Anesthesiology

## 2017-07-31 ENCOUNTER — Ambulatory Visit (HOSPITAL_COMMUNITY): Payer: 59 | Admitting: Anesthesiology

## 2017-07-31 ENCOUNTER — Encounter (HOSPITAL_COMMUNITY)
Admission: RE | Disposition: A | Discharge status: Home / Self Care | Payer: Self-pay | Source: Ambulatory Visit | Attending: Ophthalmology

## 2017-07-31 ENCOUNTER — Ambulatory Visit (HOSPITAL_COMMUNITY)
Admission: RE | Admit: 2017-07-31 | Discharge: 2017-07-31 | Disposition: A | Discharge status: Home / Self Care | Payer: 59 | Source: Ambulatory Visit | Attending: Ophthalmology | Admitting: Ophthalmology

## 2017-07-31 DIAGNOSIS — Z79899 Other long term (current) drug therapy: Secondary | ICD-10-CM | POA: Insufficient documentation

## 2017-07-31 DIAGNOSIS — K219 Gastro-esophageal reflux disease without esophagitis: Secondary | ICD-10-CM | POA: Insufficient documentation

## 2017-07-31 DIAGNOSIS — J45909 Unspecified asthma, uncomplicated: Secondary | ICD-10-CM | POA: Diagnosis not present

## 2017-07-31 DIAGNOSIS — Z888 Allergy status to other drugs, medicaments and biological substances status: Secondary | ICD-10-CM | POA: Insufficient documentation

## 2017-07-31 DIAGNOSIS — Z885 Allergy status to narcotic agent status: Secondary | ICD-10-CM | POA: Diagnosis not present

## 2017-07-31 DIAGNOSIS — I10 Essential (primary) hypertension: Secondary | ICD-10-CM | POA: Diagnosis not present

## 2017-07-31 DIAGNOSIS — E1136 Type 2 diabetes mellitus with diabetic cataract: Secondary | ICD-10-CM | POA: Insufficient documentation

## 2017-07-31 HISTORY — PX: CATARACT EXTRACTION W/PHACO: SHX586

## 2017-07-31 LAB — GLUCOSE, CAPILLARY: GLUCOSE-CAPILLARY: 104 mg/dL — AB (ref 65–99)

## 2017-07-31 SURGERY — PHACOEMULSIFICATION, CATARACT, WITH IOL INSERTION
Anesthesia: Monitor Anesthesia Care | Site: Eye | Laterality: Right

## 2017-07-31 MED ORDER — EPINEPHRINE PF 1 MG/ML IJ SOLN
INTRAOCULAR | Status: DC | PRN
  Administered 2017-07-31: 500 mL

## 2017-07-31 MED ORDER — EPINEPHRINE PF 1 MG/ML IJ SOLN
INTRAOCULAR | Status: DC | PRN
  Administered 2017-07-31: 1 mL via OPHTHALMIC

## 2017-07-31 MED ORDER — POVIDONE-IODINE 5 % OP SOLN
OPHTHALMIC | Status: DC | PRN
  Administered 2017-07-31: 1 via OPHTHALMIC

## 2017-07-31 MED ORDER — LACTATED RINGERS IV SOLN
INTRAVENOUS | Status: DC
  Administered 2017-07-31: 07:00:00 via INTRAVENOUS

## 2017-07-31 MED ORDER — SODIUM HYALURONATE 23 MG/ML IO SOLN
INTRAOCULAR | Status: DC | PRN
  Administered 2017-07-31: 0.6 mL via INTRAOCULAR

## 2017-07-31 MED ORDER — MIDAZOLAM HCL 2 MG/2ML IJ SOLN
INTRAMUSCULAR | Status: AC
  Filled 2017-07-31: qty 2

## 2017-07-31 MED ORDER — NEOMYCIN-POLYMYXIN-DEXAMETH 3.5-10000-0.1 OP SUSP
OPHTHALMIC | Status: DC | PRN
  Administered 2017-07-31: 2 [drp] via OPHTHALMIC

## 2017-07-31 MED ORDER — TETRACAINE HCL 0.5 % OP SOLN
1.0000 [drp] | OPHTHALMIC | Status: AC
  Administered 2017-07-31 (×3): 1 [drp] via OPHTHALMIC

## 2017-07-31 MED ORDER — BSS IO SOLN
INTRAOCULAR | Status: DC | PRN
  Administered 2017-07-31: 15 mL

## 2017-07-31 MED ORDER — MIDAZOLAM HCL 5 MG/5ML IJ SOLN
INTRAMUSCULAR | Status: DC | PRN
  Administered 2017-07-31: 2 mg via INTRAVENOUS

## 2017-07-31 MED ORDER — LIDOCAINE HCL 3.5 % OP GEL
1.0000 "application " | Freq: Once | OPHTHALMIC | Status: AC
  Administered 2017-07-31: 1 via OPHTHALMIC

## 2017-07-31 MED ORDER — PROVISC 10 MG/ML IO SOLN
INTRAOCULAR | Status: DC | PRN
  Administered 2017-07-31: 0.85 mL via INTRAOCULAR

## 2017-07-31 MED ORDER — PHENYLEPHRINE HCL 2.5 % OP SOLN
1.0000 [drp] | OPHTHALMIC | Status: AC
  Administered 2017-07-31 (×3): 1 [drp] via OPHTHALMIC

## 2017-07-31 MED ORDER — CYCLOPENTOLATE-PHENYLEPHRINE 0.2-1 % OP SOLN
1.0000 [drp] | OPHTHALMIC | Status: AC
  Administered 2017-07-31 (×3): 1 [drp] via OPHTHALMIC

## 2017-07-31 SURGICAL SUPPLY — 15 items
CLOTH BEACON ORANGE TIMEOUT ST (SAFETY) ×1 IMPLANT
EYE SHIELD UNIVERSAL CLEAR (GAUZE/BANDAGES/DRESSINGS) ×1 IMPLANT
GLOVE BIOGEL PI IND STRL 6.5 (GLOVE) IMPLANT
GLOVE BIOGEL PI INDICATOR 6.5 (GLOVE) ×1
GLOVE SS BIOGEL STRL SZ 6.5 (GLOVE) IMPLANT
GLOVE SUPERSENSE BIOGEL SZ 6.5 (GLOVE) ×1
LENS ALC ACRYL/TECN (Ophthalmic Related) ×1 IMPLANT
NDL HYPO 18GX1.5 BLUNT FILL (NEEDLE) IMPLANT
NEEDLE HYPO 18GX1.5 BLUNT FILL (NEEDLE) ×2 IMPLANT
PAD ARMBOARD 7.5X6 YLW CONV (MISCELLANEOUS) ×1 IMPLANT
SYR TB 1ML LL NO SAFETY (SYRINGE) ×1 IMPLANT
TAPE SURG TRANSPORE 1 IN (GAUZE/BANDAGES/DRESSINGS) IMPLANT
TAPE SURGICAL TRANSPORE 1 IN (GAUZE/BANDAGES/DRESSINGS) ×1
VISCOELASTIC ADDITIONAL (OPHTHALMIC RELATED) ×1 IMPLANT
WATER STERILE IRR 250ML POUR (IV SOLUTION) ×1 IMPLANT

## 2017-07-31 NOTE — Op Note (Signed)
Date of procedure: 07/31/17  Pre-operative diagnosis: Visually significant cataract, Right Eye (H25.11)  Post-operative diagnosis: Visually significant cataract, Right Eye  Procedure: Removal of cataract via phacoemulsification and insertion of intra-ocular lens Wynetta Emery and Nicholson  +19.5D into the capsular bag of the Right Eye  Attending surgeon: Gerda Diss. Teralyn Mullins, MD, MA  Anesthesia: MAC, Topical Akten  Complications: None  Estimated Blood Loss: <74m (minimal)  Specimens: None  Implants: As above  Indications:  Visually significant cataract, Right Eye  Procedure:  The patient was seen and identified in the pre-operative area. The operative eye was identified and dilated.  The operative eye was marked.  Topical anesthesia was administered to the operative eye.     The patient was then to the operative suite and placed in the supine position.  A timeout was performed confirming the patient, procedure to be performed, and all other relevant information.   The patient's face was prepped and draped in the usual fashion for intra-ocular surgery.  A lid speculum was placed into the operative eye and the surgical microscope moved into place and focused.  A superotemporal paracentesis was created using a 20 gauge paracentesis blade.  Shugarcaine was injected into the anterior chamber.  Viscoelastic was injected into the anterior chamber.  A temporal clear-corneal main wound incision was created using a 2.478mmicrokeratome.  A continuous curvilinear capsulorrhexis was initiated using an irrigating cystitome and completed using capsulorrhexis forceps.  Hydrodissection and hydrodeliniation were performed.  Viscoelastic was injected into the anterior chamber.  A phacoemulsification handpiece and a chopper as a second instrument were used to remove the nucleus and epinucleus. The irrigation/aspiration handpiece was used to remove any remaining cortical material.   The capsular bag was  reinflated with viscoelastic, checked, and found to be intact.  The intraocular lens was inserted into the capsular bag and dialed into place using a Kuglen hook.  The irrigation/aspiration handpiece was used to remove any remaining viscoelastic.  The clear corneal wound and paracentesis wounds were then hydrated and checked with Weck-Cels to be watertight.  The lid-speculum and drape was removed, and the patient's face was cleaned with a wet and dry 4x4.  Maxitrol was instilled in the eye before a clear shield was taped over the eye. The patient was taken to the post-operative care unit in good condition, having tolerated the procedure well.  Post-Op Instructions: [facility] and will receive all other orders and instructions.

## 2017-07-31 NOTE — Discharge Instructions (Signed)
Please discharge patient when stable, will follow up today with Dr. Kamira Mellette at the [facility] office immediately following discharge.  Leave shield in place until visit.  All paperwork with discharge instructions will be given at the office. ° ° °Monitored Anesthesia Care, Care After °These instructions provide you with information about caring for yourself after your procedure. Your health care provider may also give you more specific instructions. Your treatment has been planned according to current medical practices, but problems sometimes occur. Call your health care provider if you have any problems or questions after your procedure. °What can I expect after the procedure? °After your procedure, it is common to: °· Feel sleepy for several hours. °· Feel clumsy and have poor balance for several hours. °· Feel forgetful about what happened after the procedure. °· Have poor judgment for several hours. °· Feel nauseous or vomit. °· Have a sore throat if you had a breathing tube during the procedure. ° °Follow these instructions at home: °For at least 24 hours after the procedure: ° °· Do not: °? Participate in activities in which you could fall or become injured. °? Drive. °? Use heavy machinery. °? Drink alcohol. °? Take sleeping pills or medicines that cause drowsiness. °? Make important decisions or sign legal documents. °? Take care of children on your own. °· Rest. °Eating and drinking °· Follow the diet that is recommended by your health care provider. °· If you vomit, drink water, juice, or soup when you can drink without vomiting. °· Make sure you have little or no nausea before eating solid foods. °General instructions °· Have a responsible adult stay with you until you are awake and alert. °· Take over-the-counter and prescription medicines only as told by your health care provider. °· If you smoke, do not smoke without supervision. °· Keep all follow-up visits as told by your health care  provider. This is important. °Contact a health care provider if: °· You keep feeling nauseous or you keep vomiting. °· You feel light-headed. °· You develop a rash. °· You have a fever. °Get help right away if: °· You have trouble breathing. °This information is not intended to replace advice given to you by your health care provider. Make sure you discuss any questions you have with your health care provider. °Document Released: 07/08/2015 Document Revised: 11/07/2015 Document Reviewed: 07/08/2015 °Elsevier Interactive Patient Education © 2018 Elsevier Inc. ° °

## 2017-07-31 NOTE — Anesthesia Preprocedure Evaluation (Signed)
Anesthesia Evaluation  Patient identified by MRN, date of birth, ID band Patient awake    Reviewed: Allergy & Precautions, H&P , NPO status , Patient's Chart, lab work & pertinent test results, reviewed documented beta blocker date and time   Airway Mallampati: II  TM Distance: >3 FB Neck ROM: full    Dental no notable dental hx. (+) Edentulous Upper, Edentulous Lower   Pulmonary    Pulmonary exam normal breath sounds clear to auscultation       Cardiovascular Exercise Tolerance: Good hypertension,  Rhythm:regular Rate:Normal     Neuro/Psych negative psych ROS   GI/Hepatic negative GI ROS, Neg liver ROS,   Endo/Other  negative endocrine ROS  Renal/GU negative Renal ROS  negative genitourinary   Musculoskeletal   Abdominal   Peds  Hematology negative hematology ROS (+)   Anesthesia Other Findings No clinical complaints Medical conditions reported to be stable with no changes EKG from 1/19... Bradycardia with no acute changes  Reproductive/Obstetrics negative OB ROS                             Anesthesia Physical Anesthesia Plan  ASA: III  Anesthesia Plan: MAC   Post-op Pain Management:    Induction:   PONV Risk Score and Plan:   Airway Management Planned:   Additional Equipment:   Intra-op Plan:   Post-operative Plan:   Informed Consent: I have reviewed the patients History and Physical, chart, labs and discussed the procedure including the risks, benefits and alternatives for the proposed anesthesia with the patient or authorized representative who has indicated his/her understanding and acceptance.   Dental Advisory Given  Plan Discussed with: CRNA  Anesthesia Plan Comments:         Anesthesia Quick Evaluation

## 2017-07-31 NOTE — H&P (Signed)
The H and P was reviewed and updated. The patient was examined.  No changes were found after exam.  The surgical eye was marked.  

## 2017-07-31 NOTE — Anesthesia Postprocedure Evaluation (Signed)
Anesthesia Post Note  Patient: [name]  Procedure(s) Performed: CATARACT EXTRACTION PHACO AND INTRAOCULAR LENS PLACEMENT (IOC) (Right Eye)  Patient location during evaluation: Short Stay Anesthesia Type: MAC Level of consciousness: awake and alert and oriented Pain management: pain level controlled Vital Signs Assessment: post-procedure vital signs reviewed and stable Respiratory status: spontaneous breathing Cardiovascular status: blood pressure returned to baseline and stable Postop Assessment: no apparent nausea or vomiting Anesthetic complications: no     Last Vitals:  Vitals:   07/31/17 0720 07/31/17 0725  BP: 119/79 117/73  Pulse:    Resp: 20 20  Temp:    SpO2:  97%    Last Pain:  Vitals:   07/31/17 0710  TempSrc: Oral  PainSc: 0-No pain                 Texanna Hilburn

## 2017-07-31 NOTE — Transfer of Care (Signed)
Immediate Anesthesia Transfer of Care Note  Patient: [name]  Procedure(s) Performed: CATARACT EXTRACTION PHACO AND INTRAOCULAR LENS PLACEMENT (IOC) (Right Eye)  Patient Location: Short Stay  Anesthesia Type:MAC  Level of Consciousness: awake  Airway & Oxygen Therapy: Patient Spontanous Breathing  Post-op Assessment: Report given to RN  Post vital signs: Reviewed  Last Vitals:  Vitals Value Taken Time  BP    Temp    Pulse    Resp    SpO2      Last Pain:  Vitals:   07/31/17 0710  TempSrc: Oral  PainSc: 0-No pain         Complications: No apparent anesthesia complications

## 2017-08-03 ENCOUNTER — Encounter (HOSPITAL_COMMUNITY): Payer: Self-pay | Admitting: Ophthalmology

## 2017-08-07 ENCOUNTER — Telehealth (INDEPENDENT_AMBULATORY_CARE_PROVIDER_SITE_OTHER): Payer: Self-pay | Admitting: *Deleted

## 2017-08-10 ENCOUNTER — Ambulatory Visit (INDEPENDENT_AMBULATORY_CARE_PROVIDER_SITE_OTHER): Payer: 59

## 2017-08-10 ENCOUNTER — Ambulatory Visit (INDEPENDENT_AMBULATORY_CARE_PROVIDER_SITE_OTHER): Payer: 59 | Admitting: Family Medicine

## 2017-08-10 ENCOUNTER — Encounter: Payer: Self-pay | Admitting: Family Medicine

## 2017-08-10 ENCOUNTER — Ambulatory Visit: Payer: 59 | Admitting: Family Medicine

## 2017-08-10 VITALS — BP 153/97 | HR 102 | Temp 98.1°F | Ht 62.0 in | Wt 144.0 lb

## 2017-08-10 DIAGNOSIS — M25552 Pain in left hip: Secondary | ICD-10-CM | POA: Diagnosis not present

## 2017-08-10 DIAGNOSIS — Z9889 Other specified postprocedural states: Secondary | ICD-10-CM

## 2017-08-10 HISTORY — DX: Other specified postprocedural states: Z98.890

## 2017-08-10 MED ORDER — BACLOFEN 10 MG PO TABS: 10.0000 mg | ORAL_TABLET | Freq: Two times a day (BID) | ORAL | 0 refills | Status: DC | PRN

## 2017-08-10 MED ORDER — METHYLPREDNISOLONE ACETATE 80 MG/ML IJ SUSP
80.0000 mg | Freq: Once | INTRAMUSCULAR | Status: AC
Start: 2017-08-10 — End: 2017-08-10
  Administered 2017-08-10: 80 mg via INTRAMUSCULAR

## 2017-08-10 NOTE — Progress Notes (Signed)
BP (!) 153/97   Pulse (!) 102   Temp 98.1 F (36.7 C) (Oral)   Ht 5\' 2"  (1.575 m)   Wt 144 lb (65.3 kg)   BMI 26.34 kg/m    Subjective:    Patient ID: [name], female    DOB: [birth year], 62 y.o.   MRN: [id number]  HPI: KAYLEIGH BROADWELL is a 62 y.o. female presenting on 08/10/2017 for [place] last night, pain in left buttock (reports she blacked out, hit butt on her dresser)   HPI Pain in left buttock and leg Patient has pain in left posterior buttock and left leg that started last night.  She says she had a blackout episode that she typically has because of her heart which is being managed by cardiology but this time she fell and she hit her right forearm against a dresser she thinks and fell somewhere on her left posterior lateral buttock and leg.  She says she has pain that is the worst in her left lower buttock and also on the lateral aspect of her left upper leg.  She rates the pain as moderate in intensity and hurts with bending over and twisting of the leg.  She has been using a cane more than she normally uses because of this.  Relevant past medical, surgical, family and social history reviewed and updated as indicated. Interim medical history since our last visit reviewed. Allergies and medications reviewed and updated.  Review of Systems  Constitutional: Negative for chills and fever.  Eyes: Negative for visual disturbance.  Respiratory: Negative for chest tightness and shortness of breath.   Cardiovascular: Negative for chest pain and leg swelling.  Musculoskeletal: Positive for arthralgias. Negative for back pain, gait problem and joint swelling.  Skin: Negative for color change and rash.  Neurological: Negative for light-headedness and headaches.  Psychiatric/Behavioral: Negative for agitation and behavioral problems.  All other systems reviewed and are negative.   Per HPI unless specifically indicated above   Allergies as of 08/10/2017      Reactions   Codeine Other (See Comments)   "I will have a heart attack."   Morphine And Related Other (See Comments)   "It will cause me to have a heart attack."   Ambien [zolpidem Tartrate] Nausea And Vomiting   Lyrica [pregabalin] Swelling, Other (See Comments)   Weight gain   Neurontin [gabapentin] Other (See Comments)   Causes elevated LFTs      Medication List        Accurate as of 08/10/17 11:06 AM. Always use your most recent med list.          acyclovir ointment 5 % Commonly known as:  ZOVIRAX APPLY TO AFFECTED AREA EVERY 3 HOURS   albuterol (2.5 MG/3ML) 0.083% nebulizer solution Commonly known as:  PROVENTIL USE 1 VIAL IN NEBULIZER EVERY 4 HOURS AS NEEDED FOR WHEEZING.   albuterol 108 (90 Base) MCG/ACT inhaler Commonly known as:  VENTOLIN HFA INHALE 2 PUFFS EVERY 6 HOURS AS NEEDED FOR SHORTNESS OF BREATH AND WHEEZING.   Alcohol Pads 70 % Pads 1 pad daily to check blood sugars   busPIRone 10 MG tablet Commonly known as:  BUSPAR TAKE (1) TABLET TWICE DAILY.   butalbital-acetaminophen-caffeine 50-325-40 MG tablet Commonly known as:  FIORICET, ESGIC TAKE 1 TABLET TWICE DAILY AS NEEDED FOR HEADACHE.   butalbital-acetaminophen-caffeine 50-325-40 MG tablet Commonly known as:  FIORICET, ESGIC TAKE 1 TABLET TWICE A DAY AS NEEDED FOR HEADACHE.   carvedilol 3.125 MG  tablet Commonly known as:  COREG If systolic <130 do not take, if systolic 130-150 take 1 tab bid, If systolic >150 take 2 tabs bid   dexlansoprazole 60 MG capsule Commonly known as:  DEXILANT Take 1 capsule (60 mg total) by mouth daily.   diazepam 5 MG tablet Commonly known as:  VALIUM TAKE 1 TABLET EVERY 12 HOURS AS NEEDED FOR ANXIETY.   DULoxetine 60 MG capsule Commonly known as:  CYMBALTA Take 1 capsule (60 mg total) by mouth at bedtime.   escitalopram 20 MG tablet Commonly known as:  LEXAPRO TAKE (1) TABLET BY MOUTH ONCE DAILY.   estradiol 0.1 MG/GM vaginal cream Commonly known as:  ESTRACE PLACE  1 APPLICATORFUL VAGINALLY AT BEDTIME.   fludrocortisone 0.1 MG tablet Commonly known as:  FLORINEF Take 1 tablet (0.1 mg total) by mouth daily.   fluticasone 50 MCG/ACT nasal spray Commonly known as:  FLONASE Place 2 sprays into both nostrils daily.   fluticasone furoate-vilanterol 200-25 MCG/INH Aepb Commonly known as:  BREO ELLIPTA Inhale 1 puff into the lungs daily.   furosemide 40 MG tablet Commonly known as:  LASIX TAKE (1) TABLET BY MOUTH ONCE DAILY.   glucose blood test strip Commonly known as:  ONETOUCH VERIO Test blood sugar daily   HYDROcodone-acetaminophen 7.5-325 MG tablet Commonly known as:  NORCO Take 1 tablet by mouth every 6 (six) hours as needed for moderate pain. Start taking on:  08/12/2017   ipratropium 0.02 % nebulizer solution Commonly known as:  ATROVENT USE 1 VIAL IN NEBULIZER EVERY 4 HOURS AS NEEDED FOR WHEEZING.   lamoTRIgine 150 MG tablet Commonly known as:  LAMICTAL Take 1 tablet (150 mg total) by mouth daily.   levETIRAcetam 500 MG tablet Commonly known as:  KEPPRA TAKE (1) TABLET TWICE DAILY.   levocetirizine 5 MG tablet Commonly known as:  XYZAL TAKE 1 TABLET BY MOUTH EVERY MORNING.   meloxicam 15 MG tablet Commonly known as:  MOBIC Take 1 tablet (15 mg total) by mouth daily. Take one tablet twice the first day then one tablet BID.   metroNIDAZOLE 500 MG tablet Commonly known as:  FLAGYL Take 1 tablet (500 mg total) by mouth 2 (two) times daily.   miconazole 2 % cream Commonly known as:  MICOTIN Apply 1 application topically 2 (two) times daily.   nitroGLYCERIN 0.4 MG/SPRAY spray Commonly known as:  NITROLINGUAL Place 1 spray under the tongue every 5 (five) minutes x 3 doses as needed for chest pain.   nitroGLYCERIN 0.4 MG SL tablet Commonly known as:  NITROSTAT PLACE ONE (1) TABLET UNDER TONGUE EVERY 5 MINUTES UP TO (3) DOSES AS NEEDED FOR CHEST PAIN.   ondansetron 4 MG tablet Commonly known as:  ZOFRAN Take one to two  tablets daily as needed for nausea.   ONETOUCH DELICA LANCETS 33G Misc TEST BLOOD SUGAR ONCE DAILY.   oxybutynin 5 MG tablet Commonly known as:  DITROPAN TAKE (1) TABLET TWICE DAILY.   ranitidine 150 MG tablet Commonly known as:  ZANTAC Take 1 tablet (150 mg total) by mouth at bedtime.   rosuvastatin 10 MG tablet Commonly known as:  CRESTOR TAKE (1) TABLET BY MOUTH ONCE DAILY.   sucralfate 1 GM/10ML suspension Commonly known as:  CARAFATE Take 10 mLs (1 g total) by mouth 2 (two) times daily.   tiZANidine 4 MG tablet Commonly known as:  ZANAFLEX TAKE 1 TABLET EVERY 6 HOURS AS NEEDED FOR MUSCLE SPASM   tolterodine 2 MG tablet Commonly known  as:  DETROL TAKE (1) TABLET TWICE DAILY.   traZODone 150 MG tablet Commonly known as:  DESYREL TAKE (1) TABLET DAILY AT BEDTIME.   valACYclovir 500 MG tablet Commonly known as:  VALTREX TAKE (1) TABLET TWICE DAILY.          Objective:    BP (!) 153/97   Pulse (!) 102   Temp 98.1 F (36.7 C) (Oral)   Ht 5\' 2"  (1.575 m)   Wt 144 lb (65.3 kg)   BMI 26.34 kg/m   Wt Readings from Last 3 Encounters:  08/10/17 144 lb (65.3 kg)  07/28/17 138 lb (62.6 kg)  07/24/17 139 lb (63 kg)    Physical Exam  Constitutional: She is oriented to person, place, and time. She appears well-developed and well-nourished. No distress.  Eyes: Conjunctivae are normal.  Cardiovascular: Normal rate, regular rhythm, normal heart sounds and intact distal pulses.  No murmur heard. Pulmonary/Chest: Effort normal and breath sounds normal. No respiratory distress. She has no wheezes.  Musculoskeletal: Normal range of motion. She exhibits tenderness (Point tenderness over left lower buttock and point tenderness over left upper lateral leg.). She exhibits no edema.       Left hip: She exhibits tenderness. She exhibits normal range of motion, normal strength, no swelling, no deformity and no laceration.  Neurological: She is alert and oriented to person,  place, and time. Coordination normal.  Skin: Skin is warm and dry. No rash noted. She is not diaphoretic.     Psychiatric: She has a normal mood and affect. Her behavior is normal.  Nursing note and vitals reviewed.   Left hip x-ray: No acute bony abnormality noted, await final read from radiology    Assessment & Plan:   Problem List Items Addressed This Visit    None    Visit Diagnoses    Left hip pain    -  Primary   After fall, will do quick x-ray,   Relevant Medications   baclofen (LIORESAL) 10 MG tablet   methylPREDNISolone acetate (DEPO-MEDROL) injection 80 mg (Start on 08/10/2017 11:45 AM)   Other Relevant Orders   DG HIP UNILAT W OR W/O PELVIS 2-3 VIEWS LEFT      No fracture seen on RN, will call her back if radiology says anything different.  Follow up plan: Return if symptoms worsen or fail to improve.  Counseling provided for all of the vaccine components Orders Placed This Encounter  Procedures  . DG HIP UNILAT W OR W/O PELVIS 2-3 VIEWS LEFT    08/12/2017, MD [facility] 08/10/2017, 11:06 AM

## 2017-08-10 NOTE — Telephone Encounter (Signed)
She was supposed to F/up with Dr. Magnus Ivan after injections, did injections help? Bilateral Greater trochanter injections. If helped mor ethan 50% then repeat and schedule f/up with Dr. Magnus Ivan or Otelia Sergeant

## 2017-08-10 NOTE — Telephone Encounter (Signed)
Called pt and left vm to ask if she received 50% or more relief, left vm #1.

## 2017-08-12 ENCOUNTER — Encounter (HOSPITAL_COMMUNITY)
Admission: RE | Admit: 2017-08-12 | Discharge: 2017-08-12 | Disposition: A | Discharge status: Home / Self Care | Payer: 59 | Source: Ambulatory Visit | Attending: Ophthalmology | Admitting: Ophthalmology

## 2017-08-14 ENCOUNTER — Encounter (HOSPITAL_COMMUNITY)
Admission: RE | Disposition: A | Discharge status: Home / Self Care | Payer: Self-pay | Source: Ambulatory Visit | Attending: Ophthalmology

## 2017-08-14 ENCOUNTER — Ambulatory Visit (HOSPITAL_COMMUNITY)
Admission: RE | Admit: 2017-08-14 | Discharge: 2017-08-14 | Disposition: A | Discharge status: Home / Self Care | Payer: 59 | Source: Ambulatory Visit | Attending: Ophthalmology | Admitting: Ophthalmology

## 2017-08-14 ENCOUNTER — Ambulatory Visit (HOSPITAL_COMMUNITY): Payer: 59 | Admitting: Anesthesiology

## 2017-08-14 ENCOUNTER — Encounter (HOSPITAL_COMMUNITY): Payer: Self-pay | Admitting: Anesthesiology

## 2017-08-14 DIAGNOSIS — F319 Bipolar disorder, unspecified: Secondary | ICD-10-CM | POA: Diagnosis not present

## 2017-08-14 DIAGNOSIS — G4730 Sleep apnea, unspecified: Secondary | ICD-10-CM | POA: Diagnosis not present

## 2017-08-14 DIAGNOSIS — J449 Chronic obstructive pulmonary disease, unspecified: Secondary | ICD-10-CM | POA: Diagnosis not present

## 2017-08-14 DIAGNOSIS — M1990 Unspecified osteoarthritis, unspecified site: Secondary | ICD-10-CM | POA: Diagnosis not present

## 2017-08-14 DIAGNOSIS — M797 Fibromyalgia: Secondary | ICD-10-CM | POA: Insufficient documentation

## 2017-08-14 DIAGNOSIS — Z888 Allergy status to other drugs, medicaments and biological substances status: Secondary | ICD-10-CM | POA: Diagnosis not present

## 2017-08-14 DIAGNOSIS — I2510 Atherosclerotic heart disease of native coronary artery without angina pectoris: Secondary | ICD-10-CM | POA: Diagnosis not present

## 2017-08-14 DIAGNOSIS — H269 Unspecified cataract: Secondary | ICD-10-CM | POA: Insufficient documentation

## 2017-08-14 DIAGNOSIS — K219 Gastro-esophageal reflux disease without esophagitis: Secondary | ICD-10-CM | POA: Diagnosis not present

## 2017-08-14 DIAGNOSIS — R569 Unspecified convulsions: Secondary | ICD-10-CM | POA: Diagnosis not present

## 2017-08-14 DIAGNOSIS — I252 Old myocardial infarction: Secondary | ICD-10-CM | POA: Diagnosis not present

## 2017-08-14 DIAGNOSIS — I10 Essential (primary) hypertension: Secondary | ICD-10-CM | POA: Insufficient documentation

## 2017-08-14 DIAGNOSIS — Z885 Allergy status to narcotic agent status: Secondary | ICD-10-CM | POA: Insufficient documentation

## 2017-08-14 DIAGNOSIS — F419 Anxiety disorder, unspecified: Secondary | ICD-10-CM | POA: Insufficient documentation

## 2017-08-14 HISTORY — PX: CATARACT EXTRACTION W/PHACO: SHX586

## 2017-08-14 HISTORY — PX: EYE SURGERY: SHX253

## 2017-08-14 SURGERY — PHACOEMULSIFICATION, CATARACT, WITH IOL INSERTION
Anesthesia: Monitor Anesthesia Care | Site: Eye | Laterality: Left

## 2017-08-14 MED ORDER — CYCLOPENTOLATE-PHENYLEPHRINE 0.2-1 % OP SOLN
1.0000 [drp] | OPHTHALMIC | Status: AC
  Administered 2017-08-14 (×3): 1 [drp] via OPHTHALMIC

## 2017-08-14 MED ORDER — SODIUM HYALURONATE 23 MG/ML IO SOLN
INTRAOCULAR | Status: DC | PRN
  Administered 2017-08-14: 0.6 mL via INTRAOCULAR

## 2017-08-14 MED ORDER — LACTATED RINGERS IV SOLN
Freq: Once | INTRAVENOUS | Status: DC
  Filled 2017-08-14: qty 10

## 2017-08-14 MED ORDER — TETRACAINE HCL 0.5 % OP SOLN
1.0000 [drp] | OPHTHALMIC | Status: AC
  Administered 2017-08-14 (×3): 1 [drp] via OPHTHALMIC

## 2017-08-14 MED ORDER — POVIDONE-IODINE 5 % OP SOLN
OPHTHALMIC | Status: DC | PRN
  Administered 2017-08-14: 1 via OPHTHALMIC

## 2017-08-14 MED ORDER — EPINEPHRINE PF 1 MG/ML IJ SOLN
INTRAOCULAR | Status: DC | PRN
  Administered 2017-08-14: 1 mL

## 2017-08-14 MED ORDER — LIDOCAINE HCL 3.5 % OP GEL: 1.0000 "application " | Freq: Once | OPHTHALMIC | Status: DC

## 2017-08-14 MED ORDER — PHENYLEPHRINE HCL 2.5 % OP SOLN
1.0000 [drp] | OPHTHALMIC | Status: AC
  Administered 2017-08-14 (×3): 1 [drp] via OPHTHALMIC

## 2017-08-14 MED ORDER — EPINEPHRINE PF 1 MG/ML IJ SOLN
INTRAOCULAR | Status: DC | PRN
  Administered 2017-08-14: 500 mL

## 2017-08-14 MED ORDER — BSS IO SOLN
INTRAOCULAR | Status: DC | PRN
  Administered 2017-08-14: 15 mL

## 2017-08-14 MED ORDER — LACTATED RINGERS IV SOLN
INTRAVENOUS | Status: DC | PRN
  Administered 2017-08-14: 11:00:00 via INTRAVENOUS

## 2017-08-14 MED ORDER — PROVISC 10 MG/ML IO SOLN
INTRAOCULAR | Status: DC | PRN
  Administered 2017-08-14: 0.85 mL via INTRAOCULAR

## 2017-08-14 MED ORDER — NEOMYCIN-POLYMYXIN-DEXAMETH 3.5-10000-0.1 OP SUSP
OPHTHALMIC | Status: DC | PRN
  Administered 2017-08-14: 2 [drp] via OPHTHALMIC

## 2017-08-14 MED ORDER — MIDAZOLAM HCL 2 MG/2ML IJ SOLN
INTRAMUSCULAR | Status: AC
  Filled 2017-08-14: qty 2

## 2017-08-14 MED ORDER — SODIUM CHLORIDE 0.9 % IV SOLN
1.0000 g | Freq: Once | INTRAVENOUS | Status: DC
  Administered 2017-08-14: 1 g via INTRAVENOUS

## 2017-08-14 MED ORDER — CYCLOPENTOLATE HCL 2 % OP SOLN
1.0000 [drp] | OPHTHALMIC | Status: DC
  Filled 2017-08-14: qty 2

## 2017-08-14 SURGICAL SUPPLY — 13 items
CLOTH BEACON ORANGE TIMEOUT ST (SAFETY) ×1 IMPLANT
EYE SHIELD UNIVERSAL CLEAR (GAUZE/BANDAGES/DRESSINGS) ×1 IMPLANT
GLOVE BIOGEL PI IND STRL 6.5 (GLOVE) IMPLANT
GLOVE BIOGEL PI INDICATOR 6.5 (GLOVE) ×2
LENS ALC ACRYL/TECN (Ophthalmic Related) ×1 IMPLANT
NDL HYPO 18GX1.5 BLUNT FILL (NEEDLE) IMPLANT
NEEDLE HYPO 18GX1.5 BLUNT FILL (NEEDLE) ×2 IMPLANT
PAD ARMBOARD 7.5X6 YLW CONV (MISCELLANEOUS) ×1 IMPLANT
SYR TB 1ML LL NO SAFETY (SYRINGE) ×1 IMPLANT
TAPE SURG TRANSPORE 1 IN (GAUZE/BANDAGES/DRESSINGS) IMPLANT
TAPE SURGICAL TRANSPORE 1 IN (GAUZE/BANDAGES/DRESSINGS) ×1
VISCOELASTIC ADDITIONAL (OPHTHALMIC RELATED) ×1 IMPLANT
WATER STERILE IRR 250ML POUR (IV SOLUTION) ×1 IMPLANT

## 2017-08-14 NOTE — Anesthesia Preprocedure Evaluation (Signed)
Anesthesia Evaluation  Patient identified by MRN, date of birth, ID band Patient awake    Reviewed: Allergy & Precautions, H&P , NPO status , Patient's Chart, lab work & pertinent test results, reviewed documented beta blocker date and time   Airway Mallampati: II  TM Distance: >3 FB Neck ROM: full    Dental  (+) Edentulous Upper, Edentulous Lower   Pulmonary neg pulmonary ROS, shortness of breath, asthma , sleep apnea , pneumonia, COPD,    Pulmonary exam normal breath sounds clear to auscultation       Cardiovascular Exercise Tolerance: Good hypertension, + angina + CAD and + Past MI  negative cardio ROS  + dysrhythmias  Rhythm:regular Rate:Normal     Neuro/Psych Seizures -,  PSYCHIATRIC DISORDERS Anxiety Depression Bipolar Disorder  Neuromuscular disease negative neurological ROS  negative psych ROS   GI/Hepatic negative GI ROS, Neg liver ROS, hiatal hernia, GERD  ,  Endo/Other  negative endocrine ROS  Renal/GU negative Renal ROS  negative genitourinary   Musculoskeletal  (+) Arthritis , Fibromyalgia -  Abdominal   Peds  Hematology negative hematology ROS (+)   Anesthesia Other Findings   Reproductive/Obstetrics negative OB ROS                             Anesthesia Physical Anesthesia Plan  ASA: III  Anesthesia Plan: MAC   Post-op Pain Management:    Induction:   PONV Risk Score and Plan:   Airway Management Planned:   Additional Equipment:   Intra-op Plan:   Post-operative Plan:   Informed Consent: I have reviewed the patients History and Physical, chart, labs and discussed the procedure including the risks, benefits and alternatives for the proposed anesthesia with the patient or authorized representative who has indicated his/her understanding and acceptance.   Dental Advisory Given  Plan Discussed with: CRNA  Anesthesia Plan Comments:         Anesthesia  Quick Evaluation

## 2017-08-14 NOTE — H&P (Signed)
The H and P was reviewed and updated. The patient was examined.  No changes were found after exam.  The surgical eye was marked.  

## 2017-08-14 NOTE — Anesthesia Postprocedure Evaluation (Signed)
Anesthesia Post Note  Patient: [name]  Procedure(s) Performed: CATARACT EXTRACTION PHACO AND INTRAOCULAR LENS PLACEMENT (IOC) (Left Eye)  Patient location during evaluation: Short Stay Anesthesia Type: MAC Level of consciousness: awake and alert and oriented Pain management: pain level controlled Vital Signs Assessment: post-procedure vital signs reviewed and stable Respiratory status: spontaneous breathing Cardiovascular status: blood pressure returned to baseline Postop Assessment: no apparent nausea or vomiting and adequate PO intake Anesthetic complications: no     Last Vitals:  Vitals:   08/14/17 1023  BP: 117/74  Pulse: 68  Temp: 37 C  SpO2: 100%    Last Pain:  Vitals:   08/14/17 1023  PainSc: 3                  Avika Carbine

## 2017-08-14 NOTE — Discharge Instructions (Signed)
Please discharge patient when stable, will follow up today with Dr. Dana Dorner at the [facility] office immediately following discharge.  Leave shield in place until visit.  All paperwork with discharge instructions will be given at the office. ° °

## 2017-08-14 NOTE — Addendum Note (Signed)
Addendum  created 08/14/17 1420 by Moshe Salisbury, CRNA   Charge Capture section accepted

## 2017-08-14 NOTE — Transfer of Care (Signed)
Immediate Anesthesia Transfer of Care Note  Patient: [name]  Procedure(s) Performed: CATARACT EXTRACTION PHACO AND INTRAOCULAR LENS PLACEMENT (IOC) (Left Eye)  Patient Location: Short Stay  Anesthesia Type:MAC  Level of Consciousness: awake  Airway & Oxygen Therapy: Patient Spontanous Breathing  Post-op Assessment: Report given to RN  Post vital signs: Reviewed  Last Vitals:  Vitals Value Taken Time  BP    Temp    Pulse    Resp    SpO2      Last Pain:  Vitals:   08/14/17 1023  PainSc: 3       Patients Stated Pain Goal: 7 ([id number])  Complications: No apparent anesthesia complications

## 2017-08-14 NOTE — Op Note (Signed)
Date of procedure: 08/14/17  Pre-operative diagnosis: Visually significant cataract, Left Eye (H25.12)  Post-operative diagnosis: Visually significant cataract, Left Eye  Procedure: Removal of cataract via phacoemulsification and insertion of intra-ocular lens [place]  +20.0D into the capsular bag of the Left Eye  Attending surgeon: Gerda Diss. Briceida Rasberry, MD, MA  Anesthesia: MAC, Topical [place]  Complications: None  Estimated Blood Loss: <59m (minimal)  Specimens: None  Implants: As above  Indications:  Visually significant cataract, Left Eye  Procedure:  The patient was seen and identified in the pre-operative area. The operative eye was identified and dilated.  The operative eye was marked.  Topical anesthesia was administered to the operative eye.     The patient was then to the operative suite and placed in the supine position.  A timeout was performed confirming the patient, procedure to be performed, and all other relevant information.   The patient's face was prepped and draped in the usual fashion for intra-ocular surgery.  A lid speculum was placed into the operative eye and the surgical microscope moved into place and focused.  An inferotemporal paracentesis was created using a 20 gauge paracentesis blade.  Shugarcaine was injected into the anterior chamber.  Viscoelastic was injected into the anterior chamber.  A temporal clear-corneal main wound incision was created using a 2.433mmicrokeratome.  A continuous curvilinear capsulorrhexis was initiated using an irrigating cystitome and completed using capsulorrhexis forceps.  Hydrodissection and hydrodeliniation were performed.  Viscoelastic was injected into the anterior chamber.  A phacoemulsification handpiece and a chopper as a second instrument were used to remove the nucleus and epinucleus. The irrigation/aspiration handpiece was used to remove any remaining cortical material.   The capsular bag was  reinflated with viscoelastic, checked, and found to be intact.  The intraocular lens was inserted into the capsular bag and dialed into place using a Kuglen hook.  The irrigation/aspiration handpiece was used to remove any remaining viscoelastic.  The clear corneal wound and paracentesis wounds were then hydrated and checked with Weck-Cels to be watertight.  The lid-speculum and drape was removed, and the patient's face was cleaned with a wet and dry 4x4.  Maxitrol was instilled in the eye before a clear shield was taped over the eye. The patient was taken to the post-operative care unit in good condition, having tolerated the procedure well.  Post-Op Instructions: [facility] and will receive all other orders and instructions.

## 2017-08-17 ENCOUNTER — Encounter (HOSPITAL_COMMUNITY): Payer: Self-pay | Admitting: Ophthalmology

## 2017-08-25 ENCOUNTER — Other Ambulatory Visit: Payer: Self-pay | Admitting: Nurse Practitioner

## 2017-08-25 DIAGNOSIS — M47812 Spondylosis without myelopathy or radiculopathy, cervical region: Secondary | ICD-10-CM

## 2017-08-25 NOTE — Telephone Encounter (Signed)
Last seen 08/10/17

## 2017-08-28 ENCOUNTER — Ambulatory Visit (INDEPENDENT_AMBULATORY_CARE_PROVIDER_SITE_OTHER): Payer: 59

## 2017-08-28 ENCOUNTER — Ambulatory Visit (INDEPENDENT_AMBULATORY_CARE_PROVIDER_SITE_OTHER): Payer: 59 | Admitting: Nurse Practitioner

## 2017-08-28 ENCOUNTER — Encounter: Payer: Self-pay | Admitting: Nurse Practitioner

## 2017-08-28 VITALS — BP 128/92 | HR 75 | Temp 97.7°F | Ht 62.0 in | Wt 138.0 lb

## 2017-08-28 DIAGNOSIS — R109 Unspecified abdominal pain: Secondary | ICD-10-CM

## 2017-08-28 DIAGNOSIS — B9689 Other specified bacterial agents as the cause of diseases classified elsewhere: Secondary | ICD-10-CM

## 2017-08-28 DIAGNOSIS — N898 Other specified noninflammatory disorders of vagina: Secondary | ICD-10-CM | POA: Diagnosis not present

## 2017-08-28 DIAGNOSIS — K5901 Slow transit constipation: Secondary | ICD-10-CM

## 2017-08-28 DIAGNOSIS — N760 Acute vaginitis: Secondary | ICD-10-CM

## 2017-08-28 LAB — URINALYSIS, COMPLETE
Bilirubin, UA: NEGATIVE
Glucose, UA: NEGATIVE
NITRITE UA: NEGATIVE
PH UA: 6 (ref 5.0–7.5)
RBC, UA: NEGATIVE
Specific Gravity, UA: 1.025 (ref 1.005–1.030)
Urobilinogen, Ur: 0.2 mg/dL (ref 0.2–1.0)

## 2017-08-28 LAB — WET PREP FOR TRICH, YEAST, CLUE
Clue Cell Exam: POSITIVE — AB
TRICHOMONAS EXAM: NEGATIVE
Yeast Exam: NEGATIVE

## 2017-08-28 LAB — MICROSCOPIC EXAMINATION: RENAL EPITHEL UA: NONE SEEN /HPF

## 2017-08-28 MED ORDER — METRONIDAZOLE 500 MG PO TABS: 500.0000 mg | ORAL_TABLET | Freq: Two times a day (BID) | ORAL | 0 refills | Status: DC

## 2017-08-28 NOTE — Progress Notes (Signed)
   Subjective:    Patient ID: [name], female    DOB: 1955-08-20, 62 y.o.   MRN: [id number]   Chief Complaint: Flank Pain (left) and Excessive Sweating   HPI Patient comes in today c/o vaginal discharge with odor for several days. Has developed some flank pain and sweating. She says she has done this in the past and she had bacterial vaginosis.   Review of Systems  Constitutional: Negative for activity change and appetite change.  HENT: Negative.   Eyes: Negative for pain.  Respiratory: Negative for shortness of breath.   Cardiovascular: Negative for chest pain, palpitations and leg swelling.  Gastrointestinal: Negative for abdominal pain, constipation, diarrhea, nausea and vomiting.  Endocrine: Negative for polydipsia.  Genitourinary: Positive for flank pain (left) and vaginal discharge.  Skin: Negative for rash.  Neurological: Negative.  Negative for dizziness, weakness and headaches.  Hematological: Does not bruise/bleed easily.  Psychiatric/Behavioral: Negative.   All other systems reviewed and are negative.      Objective:   Physical Exam  Constitutional: She is oriented to person, place, and time. She appears well-developed and well-nourished. No distress.  Cardiovascular: Normal rate.  Pulmonary/Chest: Effort normal.  Genitourinary:  Genitourinary Comments: Self wet prep No CVA tenderness  Neurological: She is alert and oriented to person, place, and time.  Skin: Skin is warm.      BP (!) 128/92   Pulse 75   Temp 97.7 F (36.5 C) (Oral)   Ht 5\' 2"  (1.575 m)   Wt 138 lb (62.6 kg)   BMI 25.24 kg/m   KUB- moderate stool burden-Preliminary reading by , FNP  [facility] UA- 1+leuks Wet prep- (+) clue cells    Assessment & Plan:  [name] in today with chief complaint of Flank Pain (left) and Excessive Sweating   1. Vaginal discharge - WET PREP FOR TRICH, YEAST, CLUE  2. Left flank pain - DG Abd 1 View; Future - Urinalysis,  Complete - Urine Culture  3. Slow transit constipation miralax daily Increase fiber in diet  4. Bacterial vaginosis No bubble baths No douching - metroNIDAZOLE (FLAGYL) 500 MG tablet; Take 1 tablet (500 mg total) by mouth 2 (two) times daily.  Dispense: 14 tablet; Refill: 0\  RTO prn  [name] 12-24-1972, FNP

## 2017-08-28 NOTE — Patient Instructions (Signed)
Bacterial Vaginosis Bacterial vaginosis is an infection of the vagina. It happens when too many germs (bacteria) grow in the vagina. This infection puts you at risk for infections from sex (STIs). Treating this infection can lower your risk for some STIs. You should also treat this if you are pregnant. It can cause your baby to be born early. Follow these instructions at home: Medicines  Take over-the-counter and prescription medicines only as told by your doctor.  Take or use your antibiotic medicine as told by your doctor. Do not stop taking or using it even if you start to feel better. General instructions  If you your sexual partner is a woman, tell her that you have this infection. She needs to get treatment if she has symptoms. If you have a female partner, he does not need to be treated.  During treatment: ? Avoid sex. ? Do not douche. ? Avoid alcohol as told. ? Avoid breastfeeding as told.  Drink enough fluid to keep your pee (urine) clear or pale yellow.  Keep your vagina and butt (rectum) clean. ? Wash the area with warm water every day. ? Wipe from front to back after you use the toilet.  Keep all follow-up visits as told by your doctor. This is important. Preventing this condition  Do not douche.  Use only warm water to wash around your vagina.  Use protection when you have sex. This includes: ? Latex condoms. ? Dental dams.  Limit how many people you have sex with. It is best to only have sex with the same person (be monogamous).  Get tested for STIs. Have your partner get tested.  Wear underwear that is cotton or lined with cotton.  Avoid tight pants and pantyhose. This is most important in summer.  Do not use any products that have nicotine or tobacco in them. These include cigarettes and e-cigarettes. If you need help quitting, ask your doctor.  Do not use illegal drugs.  Limit how much alcohol you drink. Contact a doctor if:  Your symptoms do not get  better, even after you are treated.  You have more discharge or pain when you pee (urinate).  You have a fever.  You have pain in your belly (abdomen).  You have pain with sex.  Your bleed from your vagina between periods. Summary  This infection happens when too many germs (bacteria) grow in the vagina.  Treating this condition can lower your risk for some infections from sex (STIs).  You should also treat this if you are pregnant. It can cause early (premature) birth.  Do not stop taking or using your antibiotic medicine even if you start to feel better. This information is not intended to replace advice given to you by your health care provider. Make sure you discuss any questions you have with your health care provider. Document Released: 12/25/2007 Document Revised: 12/01/2015 Document Reviewed: 12/01/2015 Elsevier Interactive Patient Education  2017 [employer]  

## 2017-08-30 LAB — URINE CULTURE

## 2017-09-03 NOTE — Telephone Encounter (Signed)
Pt mobile number is disconnected, called home and left vm , called [phone] and left vm

## 2017-09-04 ENCOUNTER — Other Ambulatory Visit: Payer: Self-pay | Admitting: Nurse Practitioner

## 2017-09-04 ENCOUNTER — Telehealth: Payer: Self-pay | Admitting: Nurse Practitioner

## 2017-09-04 ENCOUNTER — Other Ambulatory Visit: Payer: Self-pay | Admitting: Family Medicine

## 2017-09-04 MED ORDER — METRONIDAZOLE 0.75 % VA GEL: 1.0000 | Freq: Two times a day (BID) | VAGINAL | 0 refills | Status: DC

## 2017-09-04 NOTE — Telephone Encounter (Signed)
Details left on patient's voice mail. 

## 2017-09-04 NOTE — Telephone Encounter (Signed)
This is controlled.  Will defer to PCP to fill when she returns

## 2017-09-04 NOTE — Telephone Encounter (Signed)
Please advise on patient's request for another script.

## 2017-09-04 NOTE — Telephone Encounter (Signed)
Ok to discontinue pill and start topical Metrogel.  Metronidazole is the treatment for BV.  Metrogel sent to pharmacy.

## 2017-09-14 ENCOUNTER — Encounter: Payer: Self-pay | Admitting: Nurse Practitioner

## 2017-09-14 ENCOUNTER — Ambulatory Visit (INDEPENDENT_AMBULATORY_CARE_PROVIDER_SITE_OTHER): Payer: 59 | Admitting: Nurse Practitioner

## 2017-09-14 ENCOUNTER — Other Ambulatory Visit: Payer: Self-pay | Admitting: Family Medicine

## 2017-09-14 VITALS — BP 130/83 | HR 86 | Temp 98.1°F | Ht 62.0 in | Wt 141.0 lb

## 2017-09-14 DIAGNOSIS — R569 Unspecified convulsions: Secondary | ICD-10-CM | POA: Diagnosis not present

## 2017-09-14 DIAGNOSIS — K2100 Gastro-esophageal reflux disease with esophagitis, without bleeding: Secondary | ICD-10-CM

## 2017-09-14 DIAGNOSIS — M791 Myalgia, unspecified site: Secondary | ICD-10-CM

## 2017-09-14 DIAGNOSIS — E785 Hyperlipidemia, unspecified: Secondary | ICD-10-CM

## 2017-09-14 DIAGNOSIS — F411 Generalized anxiety disorder: Secondary | ICD-10-CM

## 2017-09-14 DIAGNOSIS — F5101 Primary insomnia: Secondary | ICD-10-CM

## 2017-09-14 DIAGNOSIS — G8929 Other chronic pain: Secondary | ICD-10-CM

## 2017-09-14 DIAGNOSIS — F331 Major depressive disorder, recurrent, moderate: Secondary | ICD-10-CM

## 2017-09-14 DIAGNOSIS — B009 Herpesviral infection, unspecified: Secondary | ICD-10-CM

## 2017-09-14 DIAGNOSIS — M4802 Spinal stenosis, cervical region: Secondary | ICD-10-CM

## 2017-09-14 DIAGNOSIS — F3341 Major depressive disorder, recurrent, in partial remission: Secondary | ICD-10-CM

## 2017-09-14 DIAGNOSIS — I10 Essential (primary) hypertension: Secondary | ICD-10-CM

## 2017-09-14 DIAGNOSIS — M9981 Other biomechanical lesions of cervical region: Secondary | ICD-10-CM

## 2017-09-14 MED ORDER — TRAZODONE HCL 150 MG PO TABS
ORAL_TABLET | ORAL | 2 refills | Status: DC
Start: 2017-09-14 — End: 2017-12-28

## 2017-09-14 MED ORDER — FUROSEMIDE 40 MG PO TABS: ORAL_TABLET | ORAL | 1 refills | Status: DC

## 2017-09-14 MED ORDER — MICONAZOLE NITRATE 2 % EX CREA: 1.0000 "application " | TOPICAL_CREAM | Freq: Two times a day (BID) | CUTANEOUS | 0 refills | Status: DC

## 2017-09-14 MED ORDER — HYDROCODONE-ACETAMINOPHEN 7.5-325 MG PO TABS: 1.0000 | ORAL_TABLET | Freq: Four times a day (QID) | ORAL | 0 refills | Status: DC | PRN

## 2017-09-14 MED ORDER — ESCITALOPRAM OXALATE 20 MG PO TABS: ORAL_TABLET | ORAL | 5 refills | Status: DC

## 2017-09-14 MED ORDER — DULOXETINE HCL 60 MG PO CPEP: 60.0000 mg | ORAL_CAPSULE | Freq: Every day | ORAL | 5 refills | Status: DC

## 2017-09-14 MED ORDER — DEXLANSOPRAZOLE 60 MG PO CPDR
1.0000 | DELAYED_RELEASE_CAPSULE | Freq: Every day | ORAL | 2 refills | Status: DC
Start: 2017-09-14 — End: 2017-12-28

## 2017-09-14 MED ORDER — LEVETIRACETAM 500 MG PO TABS: ORAL_TABLET | ORAL | 5 refills | Status: DC

## 2017-09-14 MED ORDER — BUSPIRONE HCL 10 MG PO TABS: ORAL_TABLET | ORAL | 2 refills | Status: DC

## 2017-09-14 NOTE — Patient Instructions (Signed)

## 2017-09-14 NOTE — Progress Notes (Signed)
Subjective:    Patient ID: [name], female    DOB: 03-May-1955, 62 y.o.   MRN: [id number]   Chief Complaint: Medical Management of Chronic Issues   HPI:  1. Essential hypertension  No c/o chest pain, sob or headaches. Does check blood pressure at home. Has time swhen bloodpresure will bottom out- 80 systolic and she eats salt to increase it. BP Readings from Last 3 Encounters:  09/14/17 130/83  08/28/17 (!) 128/92  08/14/17 (!) 96/57     2. Gastroesophageal reflux disease with esophagitis  Is on zantac daily- has symptoms if does not take. We have added dexilant which has helped with symptoms.  3. Seizures ([place])  Has had no recent seizure activity. Last saw neurologist over a year ago.  4. Recurrent major depressive disorder, in partial remission ([place])  She is currenlt on lexapro and cymbalta- cymbalta is manily for fibromyalgia but has helped some with depression. Her depression comes and goes based on what is going on with her family. She has no home of her own and just lives with different children for short periods of time. Depression screen [facility] 2/9 09/14/2017 08/28/2017 08/10/2017  Decreased Interest 0 0 0  Down, Depressed, Hopeless 0 0 0  PHQ - 2 Score 0 0 0  Altered sleeping - - -  Tired, decreased energy - - -  Change in appetite - - -  Feeling bad or failure about yourself  - - -  Trouble concentrating - - -  Moving slowly or fidgety/restless - - -  Suicidal thoughts - - -  PHQ-9 Score - - -  Some recent data might be hidden     5. Primary insomnia  She takes trazadone at night to sleeps. Helps most nights. Has some night sa week that sh edoes not sleep well but works well most nights.  6. Hyperlipidemia with target LDL less than 100  Does not really watch diet , nor does she do any exercise.  7. GAD (generalized anxiety disorder)  Stays stressed with changing family situations. She takes valium which helps some with her anxiety. She is also on buspar TID.   8. Myalgia  Has chronic pain. Is currently suppose to be on norco but it is no longer on medication list and I have not filled it since february. She takes norco occasionally- 3-4x a week.  9. Neural foraminal stenosis of cervical spine  She has chronic back pain and sees Dr. Louanne Skye. He does not treat her pain.  10. Encounter for chronic pain management  No pain meds filled since February.  11. Herpes simplex virus (HSV) infection  denies any recent flare ups.    Outpatient Encounter Medications as of 09/14/2017  Medication Sig  . acyclovir ointment (ZOVIRAX) 5 % APPLY TO AFFECTED AREA EVERY 3 HOURS  . albuterol (PROVENTIL) (2.5 MG/3ML) 0.083% nebulizer solution USE 1 VIAL IN NEBULIZER EVERY 4 HOURS AS NEEDED FOR WHEEZING.  [name] albuterol (VENTOLIN HFA) 108 (90 Base) MCG/ACT inhaler INHALE 2 PUFFS EVERY 6 HOURS AS NEEDED FOR SHORTNESS OF BREATH AND WHEEZING.  [name] Alcohol Swabs (ALCOHOL PADS) 70 % PADS 1 pad daily to check blood sugars  . baclofen (LIORESAL) 10 MG tablet Take 1 tablet (10 mg total) by mouth 2 (two) times daily as needed for muscle spasms.  . busPIRone (BUSPAR) 10 MG tablet TAKE (1) TABLET TWICE DAILY.  . butalbital-acetaminophen-caffeine (FIORICET, ESGIC) 50-325-40 MG tablet TAKE 1 TABLET TWICE DAILY AS NEEDED FOR HEADACHE.  [name]  butalbital-acetaminophen-caffeine (FIORICET, ESGIC) 50-325-40 MG tablet TAKE 1 TABLET TWICE A DAY AS NEEDED FOR HEADACHE.  . carvedilol (COREG) 3.125 MG tablet If systolic <570 do not take, if systolic 177-939 take 1 tab bid, If systolic >030 take 2 tabs bid  . dexlansoprazole (DEXILANT) 60 MG capsule Take 1 capsule (60 mg total) by mouth daily.  . diazepam (VALIUM) 5 MG tablet TAKE 1 TABLET EVERY 12 HOURS AS NEEDED FOR ANXIETY.  . DULoxetine (CYMBALTA) 60 MG capsule Take 1 capsule (60 mg total) by mouth at bedtime.  [name] escitalopram (LEXAPRO) 20 MG tablet TAKE (1) TABLET BY MOUTH ONCE DAILY.  [name] estradiol (ESTRACE) 0.1 MG/GM vaginal cream PLACE 1 APPLICATORFUL  VAGINALLY AT BEDTIME.  . fludrocortisone (FLORINEF) 0.1 MG tablet Take 1 tablet (0.1 mg total) by mouth daily.  . fluticasone (FLONASE) 50 MCG/ACT nasal spray Place 2 sprays into both nostrils daily.  . fluticasone furoate-vilanterol (BREO ELLIPTA) 200-25 MCG/INH AEPB Inhale 1 puff into the lungs daily.  . furosemide (LASIX) 40 MG tablet TAKE (1) TABLET BY MOUTH ONCE DAILY.  [name] glucose blood (ONETOUCH VERIO) test strip Test blood sugar daily  . ipratropium (ATROVENT) 0.02 % nebulizer solution USE 1 VIAL IN NEBULIZER EVERY 4 HOURS AS NEEDED FOR WHEEZING.  [name] lamoTRIgine (LAMICTAL) 150 MG tablet Take 1 tablet (150 mg total) by mouth daily.  [name] levETIRAcetam (KEPPRA) 500 MG tablet TAKE (1) TABLET TWICE DAILY.  [name] levocetirizine (XYZAL) 5 MG tablet TAKE 1 TABLET BY MOUTH EVERY MORNING.  . miconazole (MICOTIN) 2 % cream Apply 1 application topically 2 (two) times daily.  . nitroGLYCERIN (NITROLINGUAL) 0.4 MG/SPRAY spray Place 1 spray under the tongue every 5 (five) minutes x 3 doses as needed for chest pain.  . nitroGLYCERIN (NITROSTAT) 0.4 MG SL tablet PLACE ONE (1) TABLET UNDER TONGUE EVERY 5 MINUTES UP TO (3) DOSES AS NEEDED FOR CHEST PAIN.  [name] ondansetron (ZOFRAN) 4 MG tablet Take one to two tablets daily as needed for nausea.  Glory Rosebush DELICA LANCETS 09Q MISC TEST BLOOD SUGAR ONCE DAILY.  [name] oxybutynin (DITROPAN) 5 MG tablet TAKE (1) TABLET TWICE DAILY.  . ranitidine (ZANTAC) 150 MG tablet Take 1 tablet (150 mg total) by mouth at bedtime.  . rosuvastatin (CRESTOR) 10 MG tablet TAKE (1) TABLET BY MOUTH ONCE DAILY.  [name] sucralfate (CARAFATE) 1 GM/10ML suspension Take 10 mLs (1 g total) by mouth 2 (two) times daily.  [name] tiZANidine (ZANAFLEX) 4 MG tablet TAKE 1 TABLET EVERY 6 HOURS AS NEEDED FOR MUSCLE SPASM  . tolterodine (DETROL) 2 MG tablet TAKE (1) TABLET TWICE DAILY.  . traZODone (DESYREL) 150 MG tablet TAKE (1) TABLET DAILY AT BEDTIME.  . valACYclovir (VALTREX) 500 MG tablet TAKE (1) TABLET TWICE  DAILY.     New complaints: None today  Social history: Living with your daughter. Doe snot get out much. She staysin her room and to herself a lot.   Review of Systems  Constitutional: Negative for activity change and appetite change.  HENT: Negative.   Eyes: Negative for pain.  Respiratory: Negative for shortness of breath.   Cardiovascular: Positive for leg swelling. Negative for chest pain and palpitations.  Gastrointestinal: Negative for abdominal pain.  Endocrine: Negative for polydipsia.  Genitourinary: Negative.   Musculoskeletal: Positive for back pain and myalgias.  Skin: Negative for rash.  Neurological: Positive for headaches (occasional). Negative for dizziness and weakness.  Hematological: Does not bruise/bleed easily.  Psychiatric/Behavioral: Negative.   All other systems reviewed and are negative.  Objective:   Physical Exam  Constitutional: She is oriented to person, place, and time.  HENT:  Head: Normocephalic.  Nose: Nose normal.  Mouth/Throat: Oropharynx is clear and moist.  Eyes: Pupils are equal, round, and reactive to light. EOM are normal.  Neck: Normal range of motion. Neck supple. No JVD present. Carotid bruit is not present.  Cardiovascular: Normal rate, regular rhythm, normal heart sounds and intact distal pulses.  Pulmonary/Chest: Effort normal and breath sounds normal. No respiratory distress. She has no wheezes. She has no rales. She exhibits no tenderness.  Abdominal: Soft. Normal appearance, normal aorta and bowel sounds are normal. She exhibits no distension, no abdominal bruit, no pulsatile midline mass and no mass. There is no splenomegaly or hepatomegaly. There is no tenderness.  Musculoskeletal: Normal range of motion. She exhibits no edema.  Decrease ROM of lumbar spine with pain on flexion and extension. (-) SLR bil   Lymphadenopathy:    She has no cervical adenopathy.  Neurological: She is alert and oriented to person, place,  and time. She has normal reflexes. She displays normal reflexes. No cranial nerve deficit.  Skin: Skin is warm and dry.  Psychiatric: She has a normal mood and affect. Her behavior is normal. Judgment and thought content normal.   BP 130/83   Pulse 86   Temp 98.1 F (36.7 C) (Oral)   Ht 5' 2" (1.575 m)   Wt 141 lb (64 kg)   BMI 25.79 kg/m         Assessment & Plan:  KEYUANA WANK comes in today with chief complaint of Medical Management of Chronic Issues   Diagnosis and orders addressed:  1. Essential hypertension Low sodium diet - furosemide (LASIX) 40 MG tablet; TAKE (1) TABLET BY MOUTH ONCE DAILY.  Dispense: 90 tablet; Refill: 1 - CMP14+EGFR  2. Gastroesophageal reflux disease with esophagitis Avoid spicy foods Do not eat 2 hours prior to bedtime - dexlansoprazole (DEXILANT) 60 MG capsule; Take 1 capsule (60 mg total) by mouth daily.  Dispense: 30 capsule; Refill: 2  3. Seizures ([place]) Keep record of seizure activity - levETIRAcetam (KEPPRA) 500 MG tablet; TAKE (1) TABLET TWICE DAILY.  Dispense: 60 tablet; Refill: 5  4. Recurrent major depressive disorder, in partial remission ([place]) Stress management  5. Primary insomnia Bedtime routine - traZODone (DESYREL) 150 MG tablet; TAKE (1) TABLET DAILY AT BEDTIME.  Dispense: 30 tablet; Refill: 2  6. Hyperlipidemia with target LDL less than 100 Low fat diet - Lipid panel  7. GAD (generalized anxiety disorder)  8. Myalgia Moist heat to joints exerci eto keep muscale warm- will decrease pain - HYDROcodone-acetaminophen (NORCO) 7.5-325 MG tablet; Take 1 tablet by mouth every 6 (six) hours as needed for moderate pain.  Dispense: 30 tablet; Refill: 0  9. Neural foraminal stenosis of cervical spine Keep follow up with Dr. Louanne Skye  10. Encounter for chronic pain management  11. Herpes simplex virus (HSV) infection  12. Anxiety state - busPIRone (BUSPAR) 10 MG tablet; TAKE (1) TABLET TWICE DAILY.  Dispense: 60  tablet; Refill: 2  13. Moderate episode of recurrent major depressive disorder (HCC) - DULoxetine (CYMBALTA) 60 MG capsule; Take 1 capsule (60 mg total) by mouth at bedtime.  Dispense: 30 capsule; Refill: 5 - escitalopram (LEXAPRO) 20 MG tablet; TAKE (1) TABLET BY MOUTH ONCE DAILY.  Dispense: 30 tablet; Refill: 5   Labs pending Health Maintenance reviewed Diet and exercise encouraged  Follow up plan: 6 months   [name] Hassell Done, FNP

## 2017-09-15 LAB — CMP14+EGFR
A/G RATIO: 1.7 (ref 1.2–2.2)
ALBUMIN: 4.3 g/dL (ref 3.6–4.8)
ALT: 12 IU/L (ref 0–32)
AST: 18 IU/L (ref 0–40)
Alkaline Phosphatase: 108 IU/L (ref 39–117)
BUN / CREAT RATIO: 14 (ref 12–28)
BUN: 13 mg/dL (ref 8–27)
Bilirubin Total: 0.2 mg/dL (ref 0.0–1.2)
CALCIUM: 9.8 mg/dL (ref 8.7–10.3)
CO2: 26 mmol/L (ref 20–29)
Chloride: 102 mmol/L (ref 96–106)
Creatinine, Ser: 0.93 mg/dL (ref 0.57–1.00)
GFR, EST AFRICAN AMERICAN: 77 mL/min/{1.73_m2} (ref >59)
GFR, EST NON AFRICAN AMERICAN: 67 mL/min/{1.73_m2} (ref >59)
GLOBULIN, TOTAL: 2.6 g/dL (ref 1.5–4.5)
Glucose: 84 mg/dL (ref 65–99)
POTASSIUM: 4.5 mmol/L (ref 3.5–5.2)
Sodium: 143 mmol/L (ref 134–144)
TOTAL PROTEIN: 6.9 g/dL (ref 6.0–8.5)

## 2017-09-15 LAB — LIPID PANEL
CHOL/HDL RATIO: 3 ratio (ref 0.0–4.4)
Cholesterol, Total: 178 mg/dL (ref 100–199)
HDL: 59 mg/dL (ref >39)
LDL Calculated: 98 mg/dL (ref 0–99)
Triglycerides: 104 mg/dL (ref 0–149)
VLDL Cholesterol Cal: 21 mg/dL (ref 5–40)

## 2017-09-17 ENCOUNTER — Other Ambulatory Visit: Payer: Self-pay | Admitting: Nurse Practitioner

## 2017-09-21 ENCOUNTER — Telehealth: Payer: Self-pay | Admitting: Nurse Practitioner

## 2017-09-21 NOTE — Telephone Encounter (Signed)
Diverticulosis flare up is usually pain- is she having any pain

## 2017-09-21 NOTE — Telephone Encounter (Signed)
Patient reports she is having lower abdominal pain, feeling nauseous, just not feeling well.  Pharmacy is [facility]

## 2017-09-22 MED ORDER — METRONIDAZOLE 500 MG PO TABS: 500.0000 mg | ORAL_TABLET | Freq: Two times a day (BID) | ORAL | 0 refills | Status: DC

## 2017-09-22 MED ORDER — CIPROFLOXACIN HCL 500 MG PO TABS: 500.0000 mg | ORAL_TABLET | Freq: Two times a day (BID) | ORAL | 0 refills | Status: DC

## 2017-09-22 NOTE — Telephone Encounter (Signed)
rx sent to pharmacy

## 2017-09-22 NOTE — Telephone Encounter (Signed)
Patient aware Rx has been sent to pharmacy. 

## 2017-09-25 ENCOUNTER — Ambulatory Visit (INDEPENDENT_AMBULATORY_CARE_PROVIDER_SITE_OTHER): Payer: Self-pay

## 2017-09-25 ENCOUNTER — Encounter (INDEPENDENT_AMBULATORY_CARE_PROVIDER_SITE_OTHER): Payer: Self-pay | Admitting: Physical Medicine and Rehabilitation

## 2017-09-25 ENCOUNTER — Ambulatory Visit (INDEPENDENT_AMBULATORY_CARE_PROVIDER_SITE_OTHER): Payer: 59 | Admitting: Physical Medicine and Rehabilitation

## 2017-09-25 DIAGNOSIS — M7061 Trochanteric bursitis, right hip: Secondary | ICD-10-CM | POA: Diagnosis not present

## 2017-09-25 DIAGNOSIS — M7062 Trochanteric bursitis, left hip: Secondary | ICD-10-CM

## 2017-09-25 MED ORDER — TRIAMCINOLONE ACETONIDE 40 MG/ML IJ SUSP
60.0000 mg | INTRAMUSCULAR | Status: AC | PRN
  Administered 2017-09-25: 60 mg via INTRA_ARTICULAR

## 2017-09-25 MED ORDER — BUPIVACAINE HCL 0.5 % IJ SOLN
3.0000 mL | INTRAMUSCULAR | Status: AC | PRN
  Administered 2017-09-25: 3 mL via INTRA_ARTICULAR

## 2017-09-25 NOTE — Progress Notes (Signed)
[name] - 62 y.o. female MRN [id number]  Date of birth: 03-29-1956  Office Visit Note: Visit Date: 09/25/2017 PCP: Bennie Pierini, FNP Referred by: Daphine Deutscher, [name], *  Subjective: Chief Complaint  Patient presents with  . Right Hip - Pain  . Left Hip - Pain   HPI: [place] a 6 email who is mainly the patient of Dr. Vira Browns.  She has had prior lumbar fusion.  She has significant cardiovascular disease.  We last saw her in February completed bilateral greater trochanteric injections with fluoroscopic guidance with good relief of her bilateral lateral hip pain.  She comes in today with worsening complaints consistent with greater trochanteric bursa pain syndrome.  Her pain is worse left than right.  She has no groin pain no trauma.  No radicular complaints or weakness.  We will go ahead and complete bilateral greater trochanteric injections again today.  Fluoroscopic guidance utilized to the body habitus.   ROS Otherwise per HPI.  Assessment & Plan: Visit Diagnoses:  1. Greater trochanteric bursitis, left   2. Greater trochanteric bursitis, right     Plan: No additional findings.   Meds & Orders: No orders of the defined types were placed in this encounter.   Orders Placed This Encounter  Procedures  . Large Joint Inj: bilateral greater trochanter  . XR C-ARM NO REPORT    Follow-up: No follow-ups on file.   Procedures: Large Joint Inj: bilateral greater trochanter on 09/25/2017 10:50 AM Indications: pain and diagnostic evaluation Details: 22 G 3.5 in needle, fluoroscopy-guided lateral approach  Arthrogram: No  Medications (Right): 3 mL bupivacaine 0.5 %; 60 mg triamcinolone acetonide 40 MG/ML Medications (Left): 3 mL bupivacaine 0.5 %; 60 mg triamcinolone acetonide 40 MG/ML Outcome: tolerated well, no immediate complications  There was excellent flow of contrast outlined the greater trochanteric bursa without vascular uptake. Procedure,  treatment alternatives, risks and benefits explained, specific risks discussed. Consent was given by the patient. Immediately prior to procedure a time out was called to verify the correct patient, procedure, equipment, support staff and site/side marked as required. Patient was prepped and draped in the usual sterile fashion.      No notes on file   Clinical History: DG HIP (WITH OR WITHOUT PELVIS) 2-3V LEFT  COMPARISON:  April 16, 2017  FINDINGS: Frontal pelvis as well as frontal and lateral left hip images were obtained. No acute fracture or dislocation evident. 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 No erosive change. There is postoperative change in the lower lumbar spine. Sacroiliac joints appear normal bilaterally.  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 No acute fracture or dislocation. Postoperative change lower lumbar spine.   Electronically Signed   By: Bretta Bang III M.D.   On: 08/10/2017 14:23   She reports that she has never smoked. She has never used smokeless tobacco.  Recent Labs    07/24/17 0930  HGBA1C 5.8*    Objective:  VS:  HT:    WT:   BMI:     BP:   HR: bpm  TEMP: ( )  RESP:  Physical Exam  Ortho Exam Imaging: Xr C-arm No Report  Result Date: 09/25/2017 Please see Notes tab for imaging impression.   Past Medical/Family/Surgical/Social History: Medications & Allergies reviewed per EMR, new medications updated. Patient Active Problem List   Diagnosis Date Noted  . Encounter for chronic pain management 09/23/2016  . Pre-diabetes 09/02/2016  . Recurrent major depressive disorder, in partial remission (  HCC) 07/14/2016  . Seizures (HCC) 07/14/2016  . GAD (generalized anxiety disorder) 07/14/2016  . Insomnia 09/05/2014  . Allergic rhinitis 09/05/2014  . Cervical spondylosis without myelopathy 12/19/2013    Class: Chronic  . Neural foraminal stenosis of cervical spine 12/19/2013  . Cervical  radiculitis 09/19/2013  . Lumbosacral spondylosis without myelopathy 11/16/2012  . Postlaminectomy syndrome, lumbar region 11/16/2012  . Herpes simplex virus (HSV) infection 10/31/2008  . Hyperlipidemia with target LDL less than 100 10/31/2008  . Essential hypertension 10/31/2008  . Asthma 10/31/2008  . GERD 10/31/2008  . SPINAL STENOSIS OF LUMBAR REGION 10/31/2008  . Myalgia 10/31/2008  . Osteoporosis 10/31/2008  . SPONDYLOLISTHESIS 10/31/2008  . SYNCOPE 10/31/2008  . Sleep apnea 10/31/2008   Past Medical History:  Diagnosis Date  . Anginal pain (HCC)    last time   . Anxiety   . Arthritis    RHEUMATOID  . Asthma   . Bipolar 1 disorder (HCC)   . Cataracts, bilateral 07/2017  . COPD (chronic obstructive pulmonary disease) (HCC)   . Coronary artery disease    reported hx of "MI";  Echo 2009 with normal LVF;  Myoview 05/2011: no ischemia  . Depression   . Dyslipidemia   . Dysrhythmia    SVT  . Esophageal stricture   . Fibromyalgia   . GERD (gastroesophageal reflux disease)   . H/O hiatal hernia   . Head injury, unspecified   . Herpes simplex infection   . History of kidney stones   . Hyperlipidemia   . Hypertension   . Insomnia   . Myocardial infarction [facility])    age 2  . Osteoporosis   . Pneumonia    hx  . Seizures (HCC)    last 3 weeks ago- prescribed keppra -never took didnt like side effects read about online  . Shortness of breath   . Sleep apnea    ? neg  . Spinal stenosis of lumbar region   . Spondylolisthesis   . Status post placement of implantable loop recorder   . Supraventricular tachycardia (HCC)   . Syncope and collapse    s/p ILR; no arhythmogenic cause identified  . UTI (lower urinary tract infection)    Family History  Problem Relation Age of Onset  . Heart attack Father   . Cancer Father   . Mental illness Father   . Cancer Mother   . Mental illness Mother   . Heart attack Brother        stents  . Alcohol abuse Brother   . Heart  disease Brother   . Drug abuse Brother   . Diabetes Brother   . Colon cancer Maternal Aunt   . Cirrhosis Brother   . Stomach cancer Neg Hx    Past Surgical History:  Procedure Laterality Date  . BACK SURGERY    . BREAST SURGERY     lumpectomy  . CATARACT EXTRACTION W/PHACO Right 07/31/2017   Procedure: CATARACT EXTRACTION PHACO AND INTRAOCULAR LENS PLACEMENT (IOC);  Surgeon: Fabio Pierce, MD;  Location: [facility];  Service: Ophthalmology;  Laterality: Right;  CDE: 2.33  . CATARACT EXTRACTION W/PHACO Left 08/14/2017   Procedure: CATARACT EXTRACTION PHACO AND INTRAOCULAR LENS PLACEMENT (IOC);  Surgeon: Fabio Pierce, MD;  Location: [facility];  Service: Ophthalmology;  Laterality: Left;  CDE: 2.74  . CHOLECYSTECTOMY    . CYSTOSCOPY     stone  . DOPPLER ECHOCARDIOGRAPHY  2009  . head up tilt table testing  06/15/2007   [name]  .  HEMORRHOID SURGERY    . insertion of implatable loop recorder  08/11/2007   [name]  . POSTERIOR CERVICAL FUSION/FORAMINOTOMY N/A 12/19/2013   Procedure: RIGHT C3-4.C4-5 AND C5-6 FORAMINOTOMIES;  Surgeon: Kerrin Champagne, MD;  Location[facility] OR;  Service: Orthopedics;  Laterality: N/A;  . TUBAL LIGATION     Social History   Occupational History  . Occupation: Disability    Comment: 15 years  Tobacco Use  . Smoking status: Never Smoker  . Smokeless tobacco: Never Used  Substance and Sexual Activity  . Alcohol use: No    Alcohol/week: 0.0 oz  . Drug use: No  . Sexual activity: Yes

## 2017-09-25 NOTE — Progress Notes (Signed)
 .  Numeric Pain Rating Scale and Functional Assessment Average Pain 7   In the last MONTH (on 0-10 scale) has pain interfered with the following?  1. General activity like being  able to carry out your everyday physical activities such as walking, climbing stairs, carrying groceries, or moving a chair?  Rating(3)    -Dye Allergies. 

## 2017-09-30 ENCOUNTER — Other Ambulatory Visit: Payer: Self-pay | Admitting: Nurse Practitioner

## 2017-09-30 ENCOUNTER — Other Ambulatory Visit: Payer: Self-pay

## 2017-09-30 ENCOUNTER — Encounter: Payer: Self-pay | Admitting: *Deleted

## 2017-09-30 ENCOUNTER — Encounter: Payer: Self-pay | Admitting: Cardiology

## 2017-09-30 ENCOUNTER — Ambulatory Visit (INDEPENDENT_AMBULATORY_CARE_PROVIDER_SITE_OTHER): Payer: 59 | Admitting: Cardiology

## 2017-09-30 VITALS — BP 137/84 | HR 79 | Ht 62.0 in | Wt 147.0 lb

## 2017-09-30 DIAGNOSIS — I10 Essential (primary) hypertension: Secondary | ICD-10-CM | POA: Diagnosis not present

## 2017-09-30 DIAGNOSIS — R079 Chest pain, unspecified: Secondary | ICD-10-CM

## 2017-09-30 MED ORDER — FUROSEMIDE 40 MG PO TABS: ORAL_TABLET | ORAL | 1 refills | Status: DC

## 2017-09-30 MED ORDER — FLUDROCORTISONE ACETATE 0.1 MG PO TABS: 0.2000 mg | ORAL_TABLET | Freq: Every day | ORAL | 1 refills | Status: DC

## 2017-09-30 NOTE — Progress Notes (Signed)
Clinical Summary [name] is a 62 y.o.female seen today for follow up of the following medical problems.    1. Chest pain - several year history of chest pain - normal cath in 2013, normal stress test 2013 - admit to [facility] 07/2014 with chest pain. Negative workup for ACS. She was discharged with an out patient Lexiscan which showed no evidence of ischemia.  -  echo 11/2014 LVEF 55-60%, no WMAs.    - seen at [facility] 05/2016 with chest pain. Diagnosed with pneumonia at the time.  - chest pain started 2 weeks prior to ER visit. Nonspecific pain, 9-10/10 in severity left chest under left breast. Into neck and left arm. No other associated symptoms. Worst with deep breaths. Constant pain with variability over the last several weeks - not related to food. Some better with NG - similar to previous pain, but less severe.   - can have chest pressure left sided into neck. Can occur at rest or with activity. 8/10 in severity. +SOB, +nausea. Lasts 10-15 minutes, sometimes up to 30 minutes.  - different from prior pain.    2. 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  - occur about once every 4-6 months. Tend to occur while walking.   - recent episodes over the last 2 weeks. Reports 4 episodes over that time. - bp's low 60s at times at home.  Occasional high bp's 190s - takes lasix daily unless bp low.   Episode at home. Got up to go to bathroom, fell to floor. Out just a few seconds,  afterard Episode [facility] parking lot while sitting in car Episode walking at home x 2   - not much water. Drinks mainly tea.  - ran out of florinef, she unsure when.           Past Medical History:   Diagnosis Date  . Anginal pain (HCC)    last time   . Anxiety   . Arthritis    RHEUMATOID  . Asthma   . Bipolar 1 disorder (HCC)   . Cataracts, bilateral 07/2017  . COPD (chronic obstructive pulmonary disease) (HCC)   . Coronary artery disease    reported hx of "MI";  Echo 2009 with normal LVF;  Myoview 05/2011: no ischemia  . Depression   . Dyslipidemia   . Dysrhythmia    SVT  . Esophageal stricture   . Fibromyalgia   . GERD (gastroesophageal reflux disease)   . H/O hiatal hernia   . Head injury, unspecified   . Herpes simplex infection   . History of kidney stones   . Hyperlipidemia   . Hypertension   . Insomnia   . Myocardial infarction [facility])    age 70  . Osteoporosis   . Pneumonia    hx  . Seizures (HCC)    last 3 weeks ago- prescribed keppra -never took didnt like side effects read about online  . Shortness of breath   . Sleep apnea    ? neg  . Spinal stenosis of lumbar region   . Spondylolisthesis   . Status post placement of implantable loop recorder   . Supraventricular tachycardia (HCC)   . Syncope and collapse    s/p ILR;  no arhythmogenic cause identified  . UTI (lower urinary tract infection)      Allergies  Allergen Reactions  . Codeine Other (See Comments)    "I will have a heart attack."  . Morphine And Related Other (See Comments)    "It will cause me to have a heart attack."  . Ambien [Zolpidem Tartrate] Nausea And Vomiting  . Lyrica [Pregabalin] Swelling and Other (See Comments)    Weight gain  . Neurontin [Gabapentin] Other (See Comments)    Causes elevated LFTs      Current Outpatient Medications  Medication Sig Dispense Refill  . acyclovir ointment (ZOVIRAX) 5 % APPLY TO AFFECTED AREA EVERY 3 HOURS 30 g 4  . albuterol (PROVENTIL) (2.5 MG/3ML) 0.083% nebulizer solution USE 1 VIAL IN NEBULIZER EVERY 4 HOURS AS NEEDED FOR WHEEZING. 180 mL 2  . albuterol (VENTOLIN HFA) 108 (90 Base) MCG/ACT inhaler INHALE 2 PUFFS EVERY 6 HOURS AS  NEEDED FOR SHORTNESS OF BREATH AND WHEEZING. 18 g 5  . Alcohol Swabs (ALCOHOL PADS) 70 % PADS 1 pad daily to check blood sugars 100 each 1  . baclofen (LIORESAL) 10 MG tablet Take 1 tablet (10 mg total) by mouth 2 (two) times daily as needed for muscle spasms. 30 each 0  . busPIRone (BUSPAR) 10 MG tablet TAKE (1) TABLET TWICE DAILY. 60 tablet 2  . butalbital-acetaminophen-caffeine (FIORICET, ESGIC) 50-325-40 MG tablet TAKE 1 TABLET TWICE DAILY AS NEEDED FOR HEADACHE. 20 tablet 1  . butalbital-acetaminophen-caffeine (FIORICET, ESGIC) 50-325-40 MG tablet TAKE 1 TABLET TWICE A DAY AS NEEDED FOR HEADACHE. 20 tablet 1  . carvedilol (COREG) 3.125 MG tablet If systolic <130 do not take, if systolic 130-150 take 1 tab bid, If systolic >150 take 2 tabs bid 213 tablet 3  . ciprofloxacin (CIPRO) 500 MG tablet Take 1 tablet (500 mg total) by mouth 2 (two) times daily. 20 tablet 0  . dexlansoprazole (DEXILANT) 60 MG capsule Take 1 capsule (60 mg total) by mouth daily. 30 capsule 2  . diazepam (VALIUM) 5 MG tablet TAKE 1 TABLET EVERY 12 HOURS AS NEEDED FOR ANXIETY. 60 tablet 2  . DULoxetine (CYMBALTA) 60 MG capsule Take 1 capsule (60 mg total) by mouth at bedtime. 30 capsule 5  . escitalopram (LEXAPRO) 20 MG tablet TAKE (1) TABLET BY MOUTH ONCE DAILY. 30 tablet 5  . estradiol (ESTRACE) 0.1 MG/GM vaginal cream PLACE 1 APPLICATORFUL VAGINALLY AT BEDTIME. 42.5 g 4  . fludrocortisone (FLORINEF) 0.1 MG tablet Take 1 tablet (0.1 mg total) by mouth daily. 60 tablet 1  . fluticasone (FLONASE) 50 MCG/ACT nasal spray Place 2 sprays into both nostrils daily. 16 g 6  . fluticasone furoate-vilanterol (BREO ELLIPTA) 200-25 MCG/INH AEPB Inhale 1 puff into the lungs daily. 60 each 5  . furosemide (LASIX) 40 MG tablet TAKE (1) TABLET BY MOUTH ONCE DAILY. 90 tablet 1  . glucose blood (ONETOUCH VERIO) test strip Test blood sugar daily 100 each 3  . HYDROcodone-acetaminophen (NORCO) 7.5-325 MG tablet Take 1 tablet by mouth every 6  (six) hours as needed for moderate pain. 30 tablet 0  . ipratropium (ATROVENT) 0.02 % nebulizer solution USE 1 VIAL IN NEBULIZER EVERY 4 HOURS AS NEEDED FOR WHEEZING. 150 mL 1  . lamoTRIgine (LAMICTAL) 150 MG tablet Take 1 tablet (150 mg total) by mouth daily. 30 tablet 5  . levETIRAcetam (KEPPRA) 500 MG tablet TAKE (1) TABLET TWICE DAILY. 60 tablet 5  . levocetirizine (XYZAL) 5 MG tablet TAKE 1 TABLET BY  MOUTH EVERY MORNING. 30 tablet 2  . metroNIDAZOLE (FLAGYL) 500 MG tablet Take 1 tablet (500 mg total) by mouth 2 (two) times daily. 20 tablet 0  . metroNIDAZOLE (METROGEL) 0.75 % vaginal gel PLACE 1 APPLICATORFUL VAGINALLY TWICE DAILY FOR 5 DAYS. 70 g 0  . miconazole (MICOTIN) 2 % cream Apply 1 application topically 2 (two) times daily. 28.35 g 0  . nitroGLYCERIN (NITROLINGUAL) 0.4 MG/SPRAY spray Place 1 spray under the tongue every 5 (five) minutes x 3 doses as needed for chest pain.    . nitroGLYCERIN (NITROSTAT) 0.4 MG SL tablet PLACE ONE (1) TABLET UNDER TONGUE EVERY 5 MINUTES UP TO (3) DOSES AS NEEDED FOR CHEST PAIN. 25 tablet 2  . ondansetron (ZOFRAN) 4 MG tablet Take one to two tablets daily as needed for nausea. 60 tablet 3  . ONETOUCH DELICA LANCETS 33G MISC TEST BLOOD SUGAR ONCE DAILY. 100 each 3  . oxybutynin (DITROPAN) 5 MG tablet TAKE (1) TABLET TWICE DAILY. 60 tablet 5  . ranitidine (ZANTAC) 150 MG tablet Take 1 tablet (150 mg total) by mouth at bedtime. 30 tablet 5  . rosuvastatin (CRESTOR) 10 MG tablet TAKE (1) TABLET BY MOUTH ONCE DAILY. 30 tablet 5  . sucralfate (CARAFATE) 1 GM/10ML suspension Take 10 mLs (1 g total) by mouth 2 (two) times daily. 420 mL 6  . tiZANidine (ZANAFLEX) 4 MG tablet TAKE (1) TABLET EVERY SIX HOURS AS NEEDED FOR MUSCLE SPASMS. 30 tablet 5  . tolterodine (DETROL) 2 MG tablet TAKE (1) TABLET TWICE DAILY. 60 tablet 5  . traZODone (DESYREL) 150 MG tablet TAKE (1) TABLET DAILY AT BEDTIME. 30 tablet 2  . valACYclovir (VALTREX) 500 MG tablet TAKE (1) TABLET  TWICE DAILY. 60 tablet 5   Current Facility-Administered Medications  Medication Dose Route Frequency Provider Last Rate Last Dose  . triamcinolone acetonide (KENALOG-40) injection 40 mg  40 mg Other Once Johna Sheriff, MD      . triamcinolone acetonide (KENALOG-40) injection 40 mg  40 mg Other Once Johna Sheriff, MD         Past Surgical History:  Procedure Laterality Date  . BACK SURGERY    . BREAST SURGERY     lumpectomy  . CATARACT EXTRACTION W/PHACO Right 07/31/2017   Procedure: CATARACT EXTRACTION PHACO AND INTRAOCULAR LENS PLACEMENT (IOC);  Surgeon: Fabio Pierce, MD;  Location: [facility];  Service: Ophthalmology;  Laterality: Right;  CDE: 2.33  . CATARACT EXTRACTION W/PHACO Left 08/14/2017   Procedure: CATARACT EXTRACTION PHACO AND INTRAOCULAR LENS PLACEMENT (IOC);  Surgeon: Fabio Pierce, MD;  Location: [facility];  Service: Ophthalmology;  Laterality: Left;  CDE: 2.74  . CHOLECYSTECTOMY    . CYSTOSCOPY     stone  . DOPPLER ECHOCARDIOGRAPHY  2009  . head up tilt table testing  06/15/2007   [name]  . HEMORRHOID SURGERY    . insertion of implatable loop recorder  08/11/2007   [name]  . POSTERIOR CERVICAL FUSION/FORAMINOTOMY N/A 12/19/2013   Procedure: RIGHT C3-4.C4-5 AND C5-6 FORAMINOTOMIES;  Surgeon: Kerrin Champagne, MD;  Location: [facility] OR;  Service: Orthopedics;  Laterality: N/A;  . TUBAL LIGATION       Allergies  Allergen Reactions  . Codeine Other (See Comments)    "I will have a heart attack."  . Morphine And Related Other (See Comments)    "It will cause me to have a heart attack."  . Ambien [Zolpidem Tartrate] Nausea And Vomiting  . Lyrica [Pregabalin] Swelling and Other (  See Comments)    Weight gain  . Neurontin [Gabapentin] Other (See Comments)    Causes elevated LFTs       Family History  Problem Relation Age of Onset  . Heart attack Father   . Cancer Father   . Mental illness Father   . Cancer Mother   . Mental illness Mother   . Heart  attack Brother        stents  . Alcohol abuse Brother   . Heart disease Brother   . Drug abuse Brother   . Diabetes Brother   . Colon cancer Maternal Aunt   . Cirrhosis Brother   . Stomach cancer Neg Hx      Social History Ms. Sowder reports that she has never smoked. She has never used smokeless tobacco. Ms. Mcglothen reports that she does not drink alcohol.   Review of Systems CONSTITUTIONAL: No weight loss, fever, chills, weakness or fatigue.  HEENT: Eyes: No visual loss, blurred vision, double vision or yellow sclerae.No hearing loss, sneezing, congestion, runny nose or sore throat.  SKIN: No rash or itching.  CARDIOVASCULAR: per hpi RESPIRATORY: No shortness of breath, cough or sputum.  GASTROINTESTINAL: No anorexia, nausea, vomiting or diarrhea. No abdominal pain or blood.  GENITOURINARY: No burning on urination, no polyuria NEUROLOGICAL: per hpi MUSCULOSKELETAL: No muscle, back pain, joint pain or stiffness.  LYMPHATICS: No enlarged nodes. No history of splenectomy.  PSYCHIATRIC: No history of depression or anxiety.  ENDOCRINOLOGIC: No reports of sweating, cold or heat intolerance. No polyuria or polydipsia.  [name]   Physical Examination Vitals:   09/30/17 0848  BP: 137/84  Pulse: 79  SpO2: 97%   Vitals:   09/30/17 0848  Weight: 147 lb (66.7 kg)  Height: 5\' 2"  (1.575 m)    Gen: resting comfortably, no acute distress HEENT: no scleral icterus, pupils equal round and reactive, no palptable cervical adenopathy,  CV: RRR, no m/r/g, no jvd Resp: Clear to auscultation bilaterally GI: abdomen is soft, non-tender, non-distended, normal bowel sounds, no hepatosplenomegaly MSK: extremities are warm, no edema.  Skin: warm, no rash Neuro:  no focal deficits Psych: appropriate affect   Diagnostic Studies 05/2011 Lexiscan MPI No ischemia   Jan 2009 Echo LEFT VENTRICLE: - Left ventricular size was normal. - Overall left ventricular systolic function was normal. -  There were no left ventricular regional wall motion abnormalities. - Left ventricular wall thickness was normal.  AORTIC VALVE: - The aortic valve was trileaflet. - Aortic valve thickness was normal.  AORTA: - The aortic root was normal in size. - The aortic arch was normal.  MITRAL VALVE: - Mitral valve structure was normal.  Doppler interpretation(s): - There was trivial mitral valvular regurgitation.  LEFT ATRIUM: - Left atrial size was normal.  PULMONARY VEINS: - The pulmonary veins were grossly normal.  RIGHT VENTRICLE: - Right ventricular size was normal. - Right ventricular systolic function was normal. - Right ventricular wall thickness was normal.  PULMONIC VALVE: - The structure of the pulmonic valve appeared to be normal.  TRICUSPID VALVE: - The tricuspid valve structure was normal.  Doppler interpretation(s): - There was no significant tricuspid valvular regurgitation.  PULMONARY ARTERY: - The pulmonary artery was normal size.  RIGHT ATRIUM: - Right atrial size was normal.  SYSTEMIC VEINS: - The inferior vena cava was normal.  PERICARDIUM: - There was no pericardial effusion.  ---------------------------------------------------------------  SUMMARY - Overall left ventricular systolic function was normal. There were no left ventricular regional wall motion  abnormalities. - The pulmonary veins were grossly normal.   09/2011 Cath Hemodynamic Findings: Central aortic pressure: 108/58  Left ventricular pressure: 107/10/14  Angiographic Findings:  Left main: No obstructive disease noted.  Left Anterior Descending Artery: Moderate to large sized vessel that courses to the apex. There is a moderate sized diagonal Caeleigh Prohaska. No obstructive disease noted.  Circumflex Artery: Large, dominant artery with moderate sized first obtuse marginal Blake Goya and left sided posterolateral Lakesha Levinson with no disease noted.  Right Coronary Artery: Small,  non-dominant vessel with no disease noted.  Left Ventricular Angiogram:LVEF=65%.  Impression:  1. No angiographic evidence of CAD  2. Normal LV systolic function  3. Non-cardiac chest pain  Recommendations: No further ischemic workup.  Complications: None. The patient tolerated the procedure well.   11/2014 echo Study Conclusions  - Left ventricle: The cavity size was normal. Systolic function was normal. The estimated ejection fraction was in the range of 55% to 60%. Wall motion was normal; there were no regional wall motion abnormalities. There was an increased relative contribution of atrial contraction to ventricular filling. Doppler parameters are consistent with abnormal left ventricular relaxation (grade 1 diastolic dysfunction). - Mitral valve: There was trivial regurgitation. - Tricuspid valve: There was trivial regurgitation. - Pulmonic valve: There was trivial regurgitation.       Assessment and Plan  1. Chest pain - long history of symptoms with multiple negative stress tests, most recently 07/2014 Lexiscan was negative at [facility]. Cath 2013 with patent vessels - recent chest pain symptoms different from prior symptoms, we will plan for nuclear stress test to further evaluate.  - EKG SR, no ischemic changes   2. Syncope - history of autonomic insufficiency - resume her florinef, given Rx for compression stockings. Encouraged more aggressive oral hydration, f/u with EP. Counseled to avoid driving at this time          Antoine Poche, M.D.

## 2017-09-30 NOTE — Patient Instructions (Signed)
Your physician recommends that you schedule a follow-up appointment in: 3 MONTHS WITH DR [facility] AND 2-3 WEEKS WITH DR Ladona Ridgel  Your physician has recommended you make the following change in your medication:   CHANGE LASIX 40 MG AS NEEDED FOR SWELLING  WE HAVE GIVEN YOU A RX FOR COMPRESSION STOCKINGS   Your physician has requested that you have a lexiscan myoview. For further information please visit [email or web address] Please follow instruction sheet, as given.  Thank you for choosing [facility]!!

## 2017-10-05 ENCOUNTER — Encounter (HOSPITAL_COMMUNITY)
Admission: RE | Admit: 2017-10-05 | Discharge: 2017-10-05 | Disposition: A | Discharge status: Home / Self Care | Payer: 59 | Source: Ambulatory Visit | Attending: Cardiology | Admitting: Cardiology

## 2017-10-05 ENCOUNTER — Encounter (HOSPITAL_COMMUNITY): Payer: Self-pay

## 2017-10-05 ENCOUNTER — Encounter (HOSPITAL_BASED_OUTPATIENT_CLINIC_OR_DEPARTMENT_OTHER)
Admission: RE | Admit: 2017-10-05 | Discharge: 2017-10-05 | Disposition: A | Discharge status: Home / Self Care | Payer: 59 | Source: Ambulatory Visit | Attending: Cardiology | Admitting: Cardiology

## 2017-10-05 DIAGNOSIS — R079 Chest pain, unspecified: Secondary | ICD-10-CM | POA: Diagnosis present

## 2017-10-05 LAB — NM MYOCAR MULTI W/SPECT W/WALL MOTION / EF
CHL CUP NUCLEAR SSS: 11
LHR: 0.39
LVDIAVOL: 46 mL (ref 46–106)
LVSYSVOL: 16 mL
Peak HR: 120 {beats}/min
Rest HR: 57 {beats}/min
SDS: 6
SRS: 5
TID: 0.97

## 2017-10-05 MED ORDER — REGADENOSON 0.4 MG/5ML IV SOLN
INTRAVENOUS | Status: AC
  Administered 2017-10-05: 0.4 mg via INTRAVENOUS
  Filled 2017-10-05: qty 5

## 2017-10-05 MED ORDER — TECHNETIUM TC 99M TETROFOSMIN IV KIT
30.0000 | PACK | Freq: Once | INTRAVENOUS | Status: AC | PRN
  Administered 2017-10-05: 28.7 via INTRAVENOUS

## 2017-10-05 MED ORDER — TECHNETIUM TC 99M TETROFOSMIN IV KIT
10.0000 | PACK | Freq: Once | INTRAVENOUS | Status: AC | PRN
  Administered 2017-10-05: 11 via INTRAVENOUS

## 2017-10-05 MED ORDER — SODIUM CHLORIDE 0.9% FLUSH
INTRAVENOUS | Status: AC
  Administered 2017-10-05: 10 mL via INTRAVENOUS
  Filled 2017-10-05: qty 10

## 2017-10-07 ENCOUNTER — Telehealth: Payer: Self-pay | Admitting: *Deleted

## 2017-10-07 NOTE — Telephone Encounter (Signed)
-----   Message from Antoine Poche, MD sent at 10/06/2017  1:05 PM EDT ----- Stress test looks good, no evidence of of any new blockages. Needs to f/u with pcp to discuss noncardiac causes of her chest pain. We will see what changes Dr Ladona Ridgel wants to make at her upcoming appt with him regarding the dizzienss and passing out.   Erica Ferry MD

## 2017-10-07 NOTE — Telephone Encounter (Signed)
Pt aware and voiced understanding - routed to pcp  

## 2017-10-10 ENCOUNTER — Encounter: Payer: Self-pay | Admitting: Cardiology

## 2017-10-20 ENCOUNTER — Ambulatory Visit: Payer: 59 | Admitting: Internal Medicine

## 2017-10-20 ENCOUNTER — Ambulatory Visit (INDEPENDENT_AMBULATORY_CARE_PROVIDER_SITE_OTHER): Payer: 59 | Admitting: Nurse Practitioner

## 2017-10-20 ENCOUNTER — Encounter: Payer: Self-pay | Admitting: Nurse Practitioner

## 2017-10-20 VITALS — BP 139/82 | HR 87 | Temp 98.3°F | Ht 62.0 in | Wt 142.0 lb

## 2017-10-20 DIAGNOSIS — I200 Unstable angina: Secondary | ICD-10-CM

## 2017-10-20 DIAGNOSIS — M47817 Spondylosis without myelopathy or radiculopathy, lumbosacral region: Secondary | ICD-10-CM

## 2017-10-20 MED ORDER — HYDROCODONE-ACETAMINOPHEN 7.5-325 MG PO TABS: 1.0000 | ORAL_TABLET | Freq: Two times a day (BID) | ORAL | 0 refills | Status: DC

## 2017-10-20 MED ORDER — ISOSORBIDE MONONITRATE ER 30 MG PO TB24: 30.0000 mg | ORAL_TABLET | Freq: Every day | ORAL | 6 refills | Status: DC

## 2017-10-20 NOTE — Progress Notes (Signed)
Subjective:    Patient ID: [name], female    DOB: 05-06-1955, 62 y.o.   MRN: [id number]   Chief Complaint: Hypertension   HPI Patient had 5 "black outs" in less then 2 weeks starting on June 24. She went to see cardiologist. He did echo cardiogram and was normal. [place] Spaniel that was not coming from her heart. She was diagnosed with angina. Cannot take nitroglycerin because of the black outs. Cardiologist told her to follow up with her PCP. Has frequent chest pain. She says that her blood pressure has been spiking at around 150 systolic. Drops frequently to 90's systolic and that is when she blacks out. Her main thing today is she needs med for angina.  Also needs pain meds filled Pain assessment: Cause of pain- DDD Pain location- low back pain Pain on scale of 1-10- 7/10 Frequency- daily What increases pain-lots of movement What makes pain Better-rest Effects on ADL - able to do what she needs to Any change in general medical condition-frequent angina  Current medications- norco 7.5/10 bid Effectiveness of current meds-brings pain down to 2/10 Adverse reactions form pain meds-none Morphine equivalent 30  Pill count performed-No Urine drug screen- No Was the NCCSR reviewed- yes  If yes were their any concerning findings? - none  Pain contract signed on:   Review of Systems  Constitutional: Negative for activity change and appetite change.  HENT: Negative.   Eyes: Negative for pain.  Respiratory: Negative for shortness of breath.   Cardiovascular: Positive for chest pain. Negative for palpitations and leg swelling.  Gastrointestinal: Negative for abdominal pain.  Endocrine: Negative for polydipsia.  Genitourinary: Negative.   Skin: Negative for rash.  Neurological: Negative for dizziness, weakness and headaches.  Hematological: Does not bruise/bleed easily.  Psychiatric/Behavioral: Negative.   All other systems reviewed and are negative.      Objective:   Physical Exam  Constitutional: She appears well-developed and well-nourished.  Cardiovascular: Normal rate.  Pulmonary/Chest: Effort normal.  Neurological: She is alert.  Skin: Skin is warm.  Psychiatric: She has a normal mood and affect. Her behavior is normal. Thought content normal.    BP 139/82   Pulse 87   Temp 98.3 F (36.8 C) (Oral)   Ht 5\' 2"  (1.575 m)   Wt 142 lb (64.4 kg)   BMI 25.97 kg/m       Assessment & Plan:  KARILYNN CARRANZA in today with chief complaint of Hypertension   1. Unstable angina (HCC) Keep check of blood pressure - isosorbide mononitrate (IMDUR) 30 MG 24 hr tablet; Take 1 tablet (30 mg total) by mouth daily.  Dispense: 30 tablet; Refill: 6  2. Lumbosacral spondylosis without myelopathy Moist heat  Rest Meds ordered this encounter  Medications  . HYDROcodone-acetaminophen (NORCO) 7.5-325 MG tablet    Sig: Take 1 tablet by mouth 2 (two) times daily.    Dispense:  60 tablet    Refill:  0    Order Specific Question:   Supervising Provider    Answer:   VINCENT, CAROL L [[id number]]  . HYDROcodone-acetaminophen (NORCO) 7.5-325 MG tablet    Sig: Take 1 tablet by mouth 2 (two) times daily.    Dispense:  60 tablet    Refill:  0    Order Specific Question:   Supervising Provider    Answer:   VINCENT, CAROL L [[id number]]  . HYDROcodone-acetaminophen (NORCO) 7.5-325 MG tablet    Sig: Take 1 tablet by mouth 2 (two) times daily.  Dispense:  60 tablet    Refill:  0    Order Specific Question:   Supervising Provider    Answer:   Johna Sheriff [[id number]]   [name] Daphine Deutscher, FNP

## 2017-10-20 NOTE — Patient Instructions (Signed)
Angina Pectoris Angina pectoris is a very bad feeling in the chest, neck, or arm. Your doctor may call it angina. There are four types of angina. Angina is caused by a lack of blood in the middle and thickest layer of the heart wall (myocardium). Angina may feel like a crushing or squeezing pain in the chest. It may feel like tightness or heavy pressure in the chest. Some people say it feels like gas, heartburn, or indigestion. Some people have symptoms other than pain. These include:  Shortness of breath.  Cold sweats.  Feeling sick to your stomach (nausea).  Feeling light-headed.  Many women have chest discomfort and some of the other symptoms. However, women often have different symptoms, such as:  Feeling tired (fatigue).  Feeling nervous for no reason.  Feeling weak for no reason.  Dizziness or fainting.  Women may have angina without any symptoms. Follow these instructions at home:  Take medicines only as told by your doctor.  Take care of other health issues as told by your doctor. These include: ? High blood pressure (hypertension). ? Diabetes.  Follow a heart-healthy diet. Your doctor can help you to choose healthy food options and make changes.  Talk to your doctor to learn more about healthy cooking methods and use them. These include: ? Roasting. ? Grilling. ? Broiling. ? Baking. ? Poaching. ? Steaming. ? Stir-frying.  Follow an exercise program approved by your doctor.  Keep a healthy weight. Lose weight as told by your doctor.  Rest when you are tired.  Learn to manage stress.  Do not use any tobacco, such as cigarettes, chewing tobacco, or electronic cigarettes. If you need help quitting, ask your doctor.  If you drink alcohol, and your doctor says it is okay, limit yourself to no more than 1 drink per day. One drink equals 12 ounces of beer, 5 ounces of wine, or 1 ounces of hard liquor.  Stop illegal drug use.  Keep all follow-up visits as told  by your doctor. This is important. Do not take these medicines unless your doctor says that you can:  Nonsteroidal anti-inflammatory drugs (NSAIDs). These include: ? Ibuprofen. ? Naproxen. ? Celecoxib.  Vitamin supplements that have vitamin A, vitamin E, or both.  Hormone therapy that contains estrogen with or without progestin.  Get help right away if:  You have pain in your chest, neck, arm, jaw, stomach, or back that: ? Lasts more than a few minutes. ? Comes back. ? Does not get better after you take medicine under your tongue (sublingual nitroglycerin).  You have any of these symptoms for no reason: ? Gas, heartburn, or indigestion. ? Sweating a lot. ? Shortness of breath or trouble breathing. ? Feeling sick to your stomach or throwing up. ? Feeling more tired than usual. ? Feeling nervous or worrying more than usual. ? Feeling weak. ? Diarrhea.  You are suddenly dizzy or light-headed.  You faint or pass out. These symptoms may be an emergency. Do not wait to see if the symptoms will go away. Get medical help right away. Call your local emergency services (911 in the [place] Do not drive yourself to the hospital. This information is not intended to replace advice given to you by your health care provider. Make sure you discuss any questions you have with your health care provider. Document Released: 09/03/2007 Document Revised: 08/23/2015 Document Reviewed: 07/19/2013 Elsevier Interactive Patient Education  2017 [employer] Inc.  

## 2017-10-20 NOTE — Addendum Note (Signed)
Addended by: Bennie Pierini on: 10/20/2017 05:23 PM   Modules accepted: Orders

## 2017-10-27 ENCOUNTER — Other Ambulatory Visit: Payer: Self-pay | Admitting: Nurse Practitioner

## 2017-10-29 ENCOUNTER — Ambulatory Visit: Payer: 59 | Admitting: Nurse Practitioner

## 2017-10-29 ENCOUNTER — Other Ambulatory Visit: Payer: Self-pay | Admitting: Internal Medicine

## 2017-10-29 ENCOUNTER — Other Ambulatory Visit: Payer: Self-pay | Admitting: Nurse Practitioner

## 2017-10-29 NOTE — Telephone Encounter (Signed)
Pt's pharmacy is requesting a refill on ondansetron that Dr. Ladona Ridgel prescribed for the pt. Would Dr. Ladona Ridgel like to refill this medication? Please address

## 2017-10-30 ENCOUNTER — Other Ambulatory Visit: Payer: Self-pay | Admitting: Nurse Practitioner

## 2017-10-30 DIAGNOSIS — J3089 Other allergic rhinitis: Secondary | ICD-10-CM

## 2017-10-30 NOTE — Telephone Encounter (Signed)
No further refills until she sees Dr. Ladona Ridgel

## 2017-11-12 ENCOUNTER — Other Ambulatory Visit: Payer: Self-pay | Admitting: Nurse Practitioner

## 2017-12-01 ENCOUNTER — Other Ambulatory Visit: Payer: Self-pay | Admitting: Nurse Practitioner

## 2017-12-01 DIAGNOSIS — J3089 Other allergic rhinitis: Secondary | ICD-10-CM

## 2017-12-03 ENCOUNTER — Ambulatory Visit (INDEPENDENT_AMBULATORY_CARE_PROVIDER_SITE_OTHER): Payer: 59 | Admitting: Family

## 2017-12-03 ENCOUNTER — Ambulatory Visit: Payer: 59 | Admitting: Nurse Practitioner

## 2017-12-03 ENCOUNTER — Other Ambulatory Visit: Payer: Self-pay | Admitting: Nurse Practitioner

## 2017-12-03 ENCOUNTER — Other Ambulatory Visit: Payer: Self-pay | Admitting: Internal Medicine

## 2017-12-03 ENCOUNTER — Encounter: Payer: Self-pay | Admitting: Family

## 2017-12-03 VITALS — BP 197/122 | HR 59 | Temp 98.4°F | Ht 62.0 in | Wt 148.6 lb

## 2017-12-03 DIAGNOSIS — R55 Syncope and collapse: Secondary | ICD-10-CM | POA: Diagnosis not present

## 2017-12-03 DIAGNOSIS — M47812 Spondylosis without myelopathy or radiculopathy, cervical region: Secondary | ICD-10-CM

## 2017-12-03 DIAGNOSIS — I10 Essential (primary) hypertension: Secondary | ICD-10-CM

## 2017-12-03 DIAGNOSIS — S40022A Contusion of left upper arm, initial encounter: Secondary | ICD-10-CM | POA: Diagnosis not present

## 2017-12-03 NOTE — Patient Instructions (Signed)
Contusion A contusion is a deep bruise. Contusions are the result of a blunt injury to tissues and muscle fibers under the skin. The injury causes bleeding under the skin. The skin overlying the contusion may turn blue, purple, or yellow. Minor injuries will give you a painless contusion, but more severe contusions may stay painful and swollen for a few weeks. What are the causes? This condition is usually caused by a blow, trauma, or direct force to an area of the body. What are the signs or symptoms? Symptoms of this condition include:  Swelling of the injured area.  Pain and tenderness in the injured area.  Discoloration. The area may have redness and then turn blue, purple, or yellow.  How is this diagnosed? This condition is diagnosed based on a physical exam and medical history. An X-ray, CT scan, or MRI may be needed to determine if there are any associated injuries, such as broken bones (fractures). How is this treated? Specific treatment for this condition depends on what area of the body was injured. In general, the best treatment for a contusion is resting, icing, applying pressure to (compression), and elevating the injured area. This is often called the RICE strategy. Over-the-counter anti-inflammatory medicines may also be recommended for pain control. Follow these instructions at home:  Rest the injured area.  If directed, apply ice to the injured area: ? Put ice in a plastic bag. ? Place a towel between your skin and the bag. ? Leave the ice on for 20 minutes, 2-3 times per day.  If directed, apply light compression to the injured area using an elastic bandage. Make sure the bandage is not wrapped too tightly. Remove and reapply the bandage as directed by your health care provider.  If possible, raise (elevate) the injured area above the level of your heart while you are sitting or lying down.  Take over-the-counter and prescription medicines only as told by your health  care provider. Contact a health care provider if:  Your symptoms do not improve after several days of treatment.  Your symptoms get worse.  You have difficulty moving the injured area. Get help right away if:  You have severe pain.  You have numbness in a hand or foot.  Your hand or foot turns pale or cold. This information is not intended to replace advice given to you by your health care provider. Make sure you discuss any questions you have with your health care provider. Document Released: 12/25/2004 Document Revised: 07/26/2015 Document Reviewed: 08/02/2014 Elsevier Interactive Patient Education  2018 Elsevier Inc.  

## 2017-12-03 NOTE — Progress Notes (Signed)
   Subjective:    Patient ID: [name], female    DOB: 1955-12-11, 62 y.o.   MRN: [id number]  Chief Complaint  Patient presents with  . left arm pain from falling   PT presents to the office today with left shoulder/arm pain that occurred after falling and hitting her dresser about 5 days ago.   Her BP is elevated today, but states she would rather her BP be elevated because when it gets too low she "passes out". She is followed by Cardiologists every 6 months.  Shoulder Injury   The incident occurred at home. The left shoulder is affected. The incident occurred 5 to 7 days ago. The injury mechanism was a fall. The quality of the pain is described as aching. The pain is at a severity of 8/10. The pain is moderate. Pertinent negatives include no numbness or tingling. The symptoms are aggravated by movement, palpation and overhead lifting. She has tried acetaminophen, non-weight bearing and rest for the symptoms. The treatment provided mild relief.      Review of Systems  Neurological: Negative for tingling and numbness.  All other systems reviewed and are negative.      Objective:   Physical Exam  Constitutional: She is oriented to person, place, and time. She appears well-developed and well-nourished. No distress.  HENT:  Head: Normocephalic.  Eyes: Pupils are equal, round, and reactive to light.  Neck: Normal range of motion. Neck supple. No thyromegaly present.  Cardiovascular: Normal rate, regular rhythm, normal heart sounds and intact distal pulses.  No murmur heard. Pulmonary/Chest: Effort normal and breath sounds normal. No respiratory distress. She has no wheezes.  Abdominal: Soft. Bowel sounds are normal. She exhibits no distension. There is no tenderness.  Musculoskeletal: She exhibits no edema or tenderness.  Pain in left arm with abduction   Neurological: She is alert and oriented to person, place, and time. She has normal reflexes. No cranial nerve deficit.    Skin: Skin is warm and dry. Ecchymosis noted.     Psychiatric: She has a normal mood and affect. Her behavior is normal. Judgment and thought content normal.  Vitals reviewed.     BP (!) 197/122   Pulse (!) 59   Temp 98.4 F (36.9 C) (Oral)   Ht 5\' 2"  (1.575 m)   Wt 148 lb 9.6 oz (67.4 kg)   BMI 27.18 kg/m      Assessment & Plan:  [name] comes in today with chief complaint of left arm pain from falling   Diagnosis and orders addressed:  1. Contusion of left upper arm, initial encounter Ice Keep elevated Continue pain medication  2. SYNCOPE  3. Essential hypertension Keep follow up with PCP and cardiologists  Take all medications   Erica Nielsen, FNP

## 2017-12-04 NOTE — Telephone Encounter (Signed)
Forward to PCP.

## 2017-12-04 NOTE — Telephone Encounter (Signed)
Seen yesterday [name]  MMM PCP

## 2017-12-04 NOTE — Telephone Encounter (Signed)
Patient Instructions by Albertine Patricia, CMA at 09/30/2017 9:00 AM  Author: Albertine Patricia, CMA Author Type: Certified Medical Assistant Filed: 09/30/2017 9:35 AM  Note Status: Signed Cosign: Cosign Not Required Encounter Date: 09/30/2017  Editor: Albertine Patricia, CMA (Certified Medical Assistant)    Your physician recommends that you schedule a follow-up appointment in: 3 MONTHS WITH DR [facility] AND 2-3 WEEKS WITH DR Ladona Ridgel  Your physician has recommended you make the following change in your medication:   CHANGE LASIX 40 MG AS NEEDED FOR SWELLING  WE HAVE GIVEN YOU A RX FOR COMPRESSION STOCKINGS

## 2017-12-08 ENCOUNTER — Other Ambulatory Visit: Payer: Self-pay | Admitting: Nurse Practitioner

## 2017-12-08 DIAGNOSIS — F411 Generalized anxiety disorder: Secondary | ICD-10-CM

## 2017-12-14 ENCOUNTER — Encounter (INDEPENDENT_AMBULATORY_CARE_PROVIDER_SITE_OTHER): Payer: Self-pay | Admitting: Family Medicine

## 2017-12-14 ENCOUNTER — Ambulatory Visit (INDEPENDENT_AMBULATORY_CARE_PROVIDER_SITE_OTHER): Payer: 59 | Admitting: Family Medicine

## 2017-12-14 ENCOUNTER — Telehealth: Payer: Self-pay | Admitting: Nurse Practitioner

## 2017-12-14 ENCOUNTER — Ambulatory Visit (INDEPENDENT_AMBULATORY_CARE_PROVIDER_SITE_OTHER): Payer: Self-pay

## 2017-12-14 DIAGNOSIS — M545 Low back pain: Secondary | ICD-10-CM | POA: Diagnosis not present

## 2017-12-14 NOTE — Progress Notes (Signed)
Office Visit Note   Patient: [name]           Date of Birth: 1955/09/24           MRN: [id number] Visit Date: 12/14/2017 Requested by: Bennie Pierini, FNP [address] [place], [place] [address] PCP: Bennie Pierini, FNP  Subjective: Chief Complaint  Patient presents with  . Lower Back - Pain    Fell 12/09/17 in [place], [place]- blacked out while going down steps, fell 5 feet.    HPI: She is a 62 year old with lower back pain.  On September 11 she fell down some stairs, apparently blacked out.  Landed on her back and was admitted to the hospital in [place].  CT scan was apparently negative.  We do not have those for review.  She continues to complain of severe pain.  She is status post fusion per Dr. Otelia Sergeant.  Taking pain medicines and muscle relaxants.  It makes her very sleepy.  She also asked me to fill out paperwork regarding asthma.  Apparently it has to be turned in today and her PCP is not available.               ROS: Otherwise noncontributory.  Objective: Vital Signs: There were no vitals taken for this visit.  Physical Exam:  Back: Very tender in the right sided paraspinous muscles proximal to her surgical site.  Minimal bony tenderness over the spinous processes.  Limited range of motion because of pain.  Imaging: 2 view lumbar x-rays: Unable to do flexion/extension views due to pain.  Surgical hardware looks intact, I do not see any acute fractures.  Assessment & Plan: 1.  Right-sided low back pain, with muscular spasm -Physical therapy referral.  Follow-up with Dr. Otelia Sergeant if symptoms do not improve.  No medications given today.  2.  Asthma -As a one-time favor, I feel that her paperwork today.   Follow-Up Instructions: No follow-ups on file.       Procedures: None today.   PMFS History: Patient Active Problem List   Diagnosis Date Noted  . Unstable angina (HCC) 10/20/2017  . Encounter for chronic pain management  09/23/2016  . Pre-diabetes 09/02/2016  . Recurrent major depressive disorder, in partial remission (HCC) 07/14/2016  . Seizures (HCC) 07/14/2016  . GAD (generalized anxiety disorder) 07/14/2016  . Insomnia 09/05/2014  . Allergic rhinitis 09/05/2014  . Cervical spondylosis without myelopathy 12/19/2013    Class: Chronic  . Neural foraminal stenosis of cervical spine 12/19/2013  . Cervical radiculitis 09/19/2013  . Lumbosacral spondylosis without myelopathy 11/16/2012  . Postlaminectomy syndrome, lumbar region 11/16/2012  . Herpes simplex virus (HSV) infection 10/31/2008  . Hyperlipidemia with target LDL less than 100 10/31/2008  . Essential hypertension 10/31/2008  . Asthma 10/31/2008  . GERD 10/31/2008  . SPINAL STENOSIS OF LUMBAR REGION 10/31/2008  . Myalgia 10/31/2008  . Osteoporosis 10/31/2008  . SPONDYLOLISTHESIS 10/31/2008  . SYNCOPE 10/31/2008  . Sleep apnea 10/31/2008   Past Medical History:  Diagnosis Date  . Anginal pain (HCC)    last time   . Anxiety   . Arthritis    RHEUMATOID  . Asthma   . Bipolar 1 disorder (HCC)   . Cataracts, bilateral 07/2017  . COPD (chronic obstructive pulmonary disease) (HCC)   . Coronary artery disease    reported hx of "MI";  Echo 2009 with normal LVF;  Myoview 05/2011: no ischemia  . Depression   . Dyslipidemia   . Dysrhythmia  SVT  . Esophageal stricture   . Fibromyalgia   . GERD (gastroesophageal reflux disease)   . H/O hiatal hernia   . Head injury, unspecified   . Herpes simplex infection   . History of kidney stones   . Hyperlipidemia   . Hypertension   . Insomnia   . Myocardial infarction [facility])    age 89  . Osteoporosis   . Pneumonia    hx  . Seizures (HCC)    last 3 weeks ago- prescribed keppra -never took didnt like side effects read about online  . Shortness of breath   . Sleep apnea    ? neg  . Spinal stenosis of lumbar region   . Spondylolisthesis   . Status post placement of implantable loop recorder     . Supraventricular tachycardia (HCC)   . Syncope and collapse    s/p ILR; no arhythmogenic cause identified  . UTI (lower urinary tract infection)     Family History  Problem Relation Age of Onset  . Heart attack Father   . Cancer Father   . Mental illness Father   . Cancer Mother   . Mental illness Mother   . Heart attack Brother        stents  . Alcohol abuse Brother   . Heart disease Brother   . Drug abuse Brother   . Diabetes Brother   . Colon cancer Maternal Aunt   . Cirrhosis Brother   . Stomach cancer Neg Hx     Past Surgical History:  Procedure Laterality Date  . BACK SURGERY    . BREAST SURGERY     lumpectomy  . CATARACT EXTRACTION W/PHACO Right 07/31/2017   Procedure: CATARACT EXTRACTION PHACO AND INTRAOCULAR LENS PLACEMENT (IOC);  Surgeon: Fabio Pierce, MD;  Location: [facility];  Service: Ophthalmology;  Laterality: Right;  CDE: 2.33  . CATARACT EXTRACTION W/PHACO Left 08/14/2017   Procedure: CATARACT EXTRACTION PHACO AND INTRAOCULAR LENS PLACEMENT (IOC);  Surgeon: Fabio Pierce, MD;  Location: [facility];  Service: Ophthalmology;  Laterality: Left;  CDE: 2.74  . CHOLECYSTECTOMY    . CYSTOSCOPY     stone  . DOPPLER ECHOCARDIOGRAPHY  2009  . head up tilt table testing  06/15/2007   [name]  . HEMORRHOID SURGERY    . insertion of implatable loop recorder  08/11/2007   [name]  . POSTERIOR CERVICAL FUSION/FORAMINOTOMY N/A 12/19/2013   Procedure: RIGHT C3-4.C4-5 AND C5-6 FORAMINOTOMIES;  Surgeon: Kerrin Champagne, MD;  Location[facility] OR;  Service: Orthopedics;  Laterality: N/A;  . TUBAL LIGATION     Social History   Occupational History  . Occupation: Disability    Comment: 15 years  Tobacco Use  . Smoking status: Never Smoker  . Smokeless tobacco: Never Used  Substance and Sexual Activity  . Alcohol use: No    Alcohol/week: 0.0 standard drinks  . Drug use: No  . Sexual activity: Yes

## 2017-12-14 NOTE — Telephone Encounter (Signed)
Aware will need appt = phone was disconnected.    Pt called back - and appt given

## 2017-12-15 ENCOUNTER — Encounter: Payer: Self-pay | Admitting: Nurse Practitioner

## 2017-12-15 ENCOUNTER — Telehealth (INDEPENDENT_AMBULATORY_CARE_PROVIDER_SITE_OTHER): Payer: Self-pay

## 2017-12-15 ENCOUNTER — Ambulatory Visit (INDEPENDENT_AMBULATORY_CARE_PROVIDER_SITE_OTHER): Payer: 59 | Admitting: Nurse Practitioner

## 2017-12-15 ENCOUNTER — Ambulatory Visit: Payer: 59 | Admitting: Nurse Practitioner

## 2017-12-15 VITALS — BP 138/92 | HR 90 | Temp 98.6°F | Ht 62.0 in | Wt 138.0 lb

## 2017-12-15 DIAGNOSIS — Z0289 Encounter for other administrative examinations: Secondary | ICD-10-CM

## 2017-12-15 DIAGNOSIS — R55 Syncope and collapse: Secondary | ICD-10-CM

## 2017-12-15 DIAGNOSIS — R52 Pain, unspecified: Secondary | ICD-10-CM | POA: Diagnosis not present

## 2017-12-15 DIAGNOSIS — M47817 Spondylosis without myelopathy or radiculopathy, lumbosacral region: Secondary | ICD-10-CM | POA: Diagnosis not present

## 2017-12-15 MED ORDER — HYDROCODONE-ACETAMINOPHEN 7.5-325 MG PO TABS
1.0000 | ORAL_TABLET | Freq: Two times a day (BID) | ORAL | 0 refills | Status: DC
Start: 2018-01-19 — End: 2018-03-16

## 2017-12-15 MED ORDER — HYDROCODONE-ACETAMINOPHEN 7.5-325 MG PO TABS: 1.0000 | ORAL_TABLET | Freq: Two times a day (BID) | ORAL | 0 refills | Status: DC

## 2017-12-15 MED ORDER — HYDROCODONE-ACETAMINOPHEN 7.5-325 MG PO TABS
1.0000 | ORAL_TABLET | Freq: Two times a day (BID) | ORAL | 0 refills | Status: DC
Start: 2017-12-20 — End: 2018-03-16

## 2017-12-15 NOTE — Progress Notes (Addendum)
   Subjective:    Patient ID: [name], female    DOB: Mar 03, 1956, 62 y.o.   MRN: [id number]   Chief Complaint: Fall Sept 4 (Has been seen at hospital) and Pain   HPI - patient fell on September 4th down steps. Sh ewas taking to hospital and they referred her back  To Dr. Otelia Sergeant and he has her on bedrest and walking with walker. She has been taking extra pain pills in order to sleep. She says she blacked out again which caused the fall. - Pain management Pain assessment: Cause of pain- multiple falls Pain location- today it is in lower back and buttocks from fal on sept 14. Pain on scale of 1-10- 8/10 Frequency- daily What increases pain-moement What makes pain Better-rest and being still Effects on ADL - not been able to do much of anything other then go to the bathroom. Any change in general medical condition-none  Current medications- noro 7.5/325 BID Effectiveness of current meds-helps some  Adverse reactions form pain meds- none Morphine equivalent- 15  Pill count performed-No Urine drug screen- Yes Was the NCCSR reviewed- yes  If yes were their any concerning findings? - none  Pain contract signed on:   Review of Systems  Constitutional: Negative.   Respiratory: Negative.   Cardiovascular: Negative.   Musculoskeletal: Positive for back pain and gait problem.  Neurological: Positive for dizziness, syncope and weakness.  Psychiatric/Behavioral: Negative.   All other systems reviewed and are negative.      Objective:   Physical Exam  Constitutional: She is oriented to person, place, and time. She appears well-developed and well-nourished. She appears distressed (moderate).  Cardiovascular: Normal rate.  Pulmonary/Chest: Effort normal.  Musculoskeletal:  decrease ROM of lumbar spine with pain on flexion, rotation ad extension (+) left at 45 degrees Left side slightly weaker then right  Neurological: She is alert and oriented to person, place, and time.  No cranial nerve deficit. Coordination normal.  Skin: Skin is warm.  Psychiatric: She has a normal mood and affect. Her behavior is normal. Thought content normal.   BP (!) 138/92   Pulse 90   Temp 98.6 F (37 C) (Oral)   Ht 5\' 2"  (1.575 m)   Wt 138 lb (62.6 kg)   BMI 25.24 kg/m      Assessment & Plan:  [name] in today with chief complaint of Fall Sept 4 (Has been seen at hospital) and Pain   1. SYNCOPE Keep check of bloodpressure Force fluids rest - Ambulatory referral to Neurology  2. Lumbosacral spondylosis without myelopathy fall precautions Rest RTO prn and in 3 months - HYDROcodone-acetaminophen (NORCO) 7.5-325 MG tablet; Take 1 tablet by mouth 2 (two) times daily.  Dispense: 60 tablet; Refill: 0 - HYDROcodone-acetaminophen (NORCO) 7.5-325 MG tablet; Take 1 tablet by mouth 2 (two) times daily.  Dispense: 60 tablet; Refill: 0 - HYDROcodone-acetaminophen (NORCO) 7.5-325 MG tablet; Take 1 tablet by mouth 2 (two) times daily.  Dispense: 60 tablet; Refill: 0   [name] 07-29-1976, FNP

## 2017-12-15 NOTE — Patient Instructions (Signed)
Opioid Pain Medicine Information Opioids are powerful medicines that are used to treat moderate to severe pain. Opioids should be taken with the supervision of a trained health care provider. They should be taken for the shortest period of time as possible. This is because opioids can be addictive and the longer you take opioids, the greater your risk of addiction (opioid use disorder). What do opioids do? Opioids help to reduce or eliminate pain. When used for short periods of time, they can help you:  Sleep better.  Do better in physical or occupational therapy.  Feel better in the first few days after an injury.  Recover from surgery.  What is a pain treatment plan? A pain treatment plan is an agreement between you and your health care provider. Pain is unique to each person, and treatments vary depending on your condition. To manage your pain successfully, you and your health care provider need to understand each other and work together. To help you do this:  Discuss the goals of your treatment, including how much pain you might expect to have and how you will manage the pain.  Review the risks and benefits of taking opioid medicines for your condition.  Remember that a good treatment plan uses more than one approach and minimizes the chance of side effects.  Be honest about the amount of medicines you take, and about any drug or alcohol use.  Get pain medicine prescriptions from only one care provider.  Keep all follow-up visits as told by your health care provider. This is important.  What instructions should I follow while taking opioid pain medicine? While you are taking the medicine and for 8 hours after you stop taking the medicine, follow these instructions:  Do not drive.  Do not use machinery or power tools.  Do not sign legal documents.  Do not drink alcohol.  Do not take sleeping pills.  Do not supervise children by yourself.  Do not participate in activities  that require climbing or being in high places.  Do not enter a body of water-such as a lake, river, ocean, spa, or swimming pool-unless an adult is nearby who can monitor and help you.  What kinds of side effects can opioids cause? Opioids can cause side effects, such as:  Constipation.  Nausea.  Vomiting.  Drowsiness.  Confusion.  Opioid use disorder.  Breathing difficulties (respiratory depression).  Using opioid pain medicines for longer than 3 days increases your risk of these side effects. Taking opioid pain medicine for a long period of time can affect your ability to do daily tasks. It also puts you at risk for:  Motor vehicle accidents.  Depression.  Suicide.  Heart attack.  Overdose, which can sometimes lead to death.  What are alternative ways to manage pain? Pain can be managed with many types of alternative treatments. Ask your health care provider to refer you to one or more specialists who can help you manage pain through:  Physical or occupational therapy.  Counseling (cognitive behavioral therapy).  Good nutrition.  Biofeedback.  Massage.  Meditation.  Non-opioid medicine.  Following a gentle exercise program.  How can I keep others safe while I am taking opioid pain medicine?  Keep pain medicine in a locked cabinet, or in a secure area where children cannot reach it.  Never share your pain medicine with anyone.  Do not save any leftover pills. If you have leftover medicine, you can: 1. Bring the medicine to a prescription take-back program.   This is usually offered by the county or law enforcement. 2. Throw it out in the trash. To do this:  Mix the medicine with undesirable trash such as pet waste or food.  Put the mixture in a sealed container or plastic bag.  Throw it in the trash.  Destroy any personal information on the prescription bottle. How do I stop taking opioids if I have been taking them for a long time? If you have  been taking opioid medicine for more than a few weeks, you may need to slowly decrease (taper) how much you take until you stop completely. Tapering your use of opioids can decrease your chances of experiencing withdrawal symptoms, such as:  Pain and cramping in the abdomen.  Nausea.  Sweating.  Sleepiness.  Restlessness.  Uncontrollable shaking (tremors).  Cravings for the medicine.  Do not attempt to taper your use of opioids on your own. Talk with your health care provider about how to do this. Your health care provider may prescribe a step-down schedule based on how much medicine you are taking and how long you have been taking it. Where to find support: If you have been taking opioids for a long time, you may benefit from receiving support for quitting from a local support group or counselor. Ask your health care provider for a referral to these resources in your area. Where to find more information:  Centers for Disease Control and Prevention (CDC): www.cdc.gov/drugoverdose/opioids/index.html Get help right away if: Seek medical care right away if you are taking opioids and you (or people close to you) notice any of the following:  Difficulty breathing.  Breathing that is slower or more shallow than normal.  A very slow heartbeat (pulse).  Severe confusion.  Unconsciousness.  Sleepiness.  Slurred speech.  Nausea and vomiting.  Cold, clammy skin.  Blue lips or fingernails.  Limpness.  Abnormally small pupils.  If you think that you or someone else may have taken too much of an opioid medicine, get medical help right away. Do not wait to see if the symptoms go away on their own.  If you ever feel like you may hurt yourself or others, or have thoughts about taking your own life, get help right away. You can go to your nearest emergency department or call:  Your local emergency services (911 in the [place]  The hotline of the National Poison Control Center  (1-800-222-1222 in the [place]  A suicide crisis helpline, such as the National Suicide Prevention Lifeline at 1-800-273-8255. This is open 24 hours a day.  Summary  Opioid medicines can help you manage moderate to severe pain for a short period of time.  Discuss the goals of your treatment with your health care provider, including how much pain you might expect to have and how you will manage the pain.  A good treatment plan uses more than one approach. Pain can be managed with many types of alternative treatments.  If you think that you or someone else may have taken too much of an opioid, get medical help right away. This information is not intended to replace advice given to you by your health care provider. Make sure you discuss any questions you have with your health care provider. Document Released: 04/13/2015 Document Revised: 07/04/2016 Document Reviewed: 10/27/2014 Elsevier Interactive Patient Education  2018 Elsevier Inc.  

## 2017-12-15 NOTE — Telephone Encounter (Signed)
Paperwork needing to be completed for Asthma has been faxed to [phone].

## 2017-12-18 ENCOUNTER — Telehealth: Payer: Self-pay | Admitting: Nurse Practitioner

## 2017-12-18 NOTE — Telephone Encounter (Signed)
Pt notified of recommendation appt scheduled 

## 2017-12-18 NOTE — Telephone Encounter (Signed)
We can not call in prdenisone or cough meds other then tesalon perles without being seen

## 2017-12-18 NOTE — Telephone Encounter (Signed)
Patient has productive cough, chest congestion and wheezing.  Has been using otc Robitussin.  Would like to know if you will prescribe Prednisone and a cough medicine.  She uses [employer].

## 2017-12-20 LAB — TOXASSURE SELECT 13 (MW), URINE

## 2017-12-21 ENCOUNTER — Ambulatory Visit: Payer: 59 | Admitting: Family Medicine

## 2017-12-22 ENCOUNTER — Ambulatory Visit: Payer: 59 | Attending: Family Medicine | Admitting: Physical Therapy

## 2017-12-22 ENCOUNTER — Other Ambulatory Visit: Payer: Self-pay

## 2017-12-22 ENCOUNTER — Ambulatory Visit: Payer: 59 | Admitting: Nurse Practitioner

## 2017-12-22 ENCOUNTER — Encounter: Payer: Self-pay | Admitting: Nurse Practitioner

## 2017-12-22 ENCOUNTER — Ambulatory Visit (INDEPENDENT_AMBULATORY_CARE_PROVIDER_SITE_OTHER): Payer: 59 | Admitting: Nurse Practitioner

## 2017-12-22 ENCOUNTER — Encounter: Payer: Self-pay | Admitting: Physical Therapy

## 2017-12-22 VITALS — BP 169/87 | HR 80 | Temp 98.1°F | Ht 62.0 in | Wt 145.0 lb

## 2017-12-22 DIAGNOSIS — R059 Cough, unspecified: Secondary | ICD-10-CM

## 2017-12-22 DIAGNOSIS — J209 Acute bronchitis, unspecified: Secondary | ICD-10-CM

## 2017-12-22 DIAGNOSIS — R05 Cough: Secondary | ICD-10-CM

## 2017-12-22 DIAGNOSIS — M545 Low back pain: Secondary | ICD-10-CM | POA: Diagnosis present

## 2017-12-22 DIAGNOSIS — J440 Chronic obstructive pulmonary disease with acute lower respiratory infection: Secondary | ICD-10-CM | POA: Diagnosis not present

## 2017-12-22 DIAGNOSIS — G8929 Other chronic pain: Secondary | ICD-10-CM | POA: Insufficient documentation

## 2017-12-22 DIAGNOSIS — R293 Abnormal posture: Secondary | ICD-10-CM | POA: Insufficient documentation

## 2017-12-22 MED ORDER — BENZONATATE 100 MG PO CAPS: 100.0000 mg | ORAL_CAPSULE | Freq: Three times a day (TID) | ORAL | 0 refills | Status: DC | PRN

## 2017-12-22 MED ORDER — PREDNISONE 20 MG PO TABS: ORAL_TABLET | ORAL | 0 refills | Status: DC

## 2017-12-22 NOTE — Therapy (Signed)
[facility] [address] [place], [place], [address] Phone: [phone]   Fax:  [phone]  Physical Therapy Evaluation  Patient Details  Name: [name] MRN: [id number] Date of Birth: 11/05/1955 Referring Provider: Lavada Mesi MD   Encounter Date: 12/22/2017  PT End of Session - 12/22/17 1245    Visit Number  1    Number of Visits  12    Date for PT Re-Evaluation  02/02/18    PT Start Time  1031    PT Stop Time  1059    PT Time Calculation (min)  28 min    Activity Tolerance  Patient tolerated treatment well    Behavior During Therapy  [facility] for tasks assessed/performed       Past Medical History:  Diagnosis Date  . Anginal pain (HCC)    last time   . Anxiety   . Arthritis    RHEUMATOID  . Asthma   . Bipolar 1 disorder (HCC)   . Cataracts, bilateral 07/2017  . COPD (chronic obstructive pulmonary disease) (HCC)   . Coronary artery disease    reported hx of "MI";  Echo 2009 with normal LVF;  Myoview 05/2011: no ischemia  . Depression   . Dyslipidemia   . Dysrhythmia    SVT  . Esophageal stricture   . Fibromyalgia   . GERD (gastroesophageal reflux disease)   . H/O hiatal hernia   . Head injury, unspecified   . Herpes simplex infection   . History of kidney stones   . Hyperlipidemia   . Hypertension   . Insomnia   . Myocardial infarction [facility])    age 62  . Osteoporosis   . Pneumonia    hx  . Seizures (HCC)    last 3 weeks ago- prescribed keppra -never took didnt like side effects read about online  . Shortness of breath   . Sleep apnea    ? neg  . Spinal stenosis of lumbar region   . Spondylolisthesis   . Status post placement of implantable loop recorder   . Supraventricular tachycardia (HCC)   . Syncope and collapse    s/p ILR; no arhythmogenic cause identified  . UTI (lower urinary tract infection)     Past Surgical History:  Procedure Laterality Date  . BACK SURGERY    . BREAST SURGERY      lumpectomy  . CATARACT EXTRACTION W/PHACO Right 07/31/2017   Procedure: CATARACT EXTRACTION PHACO AND INTRAOCULAR LENS PLACEMENT (IOC);  Surgeon: Fabio Pierce, MD;  Location: [facility];  Service: Ophthalmology;  Laterality: Right;  CDE: 2.33  . CATARACT EXTRACTION W/PHACO Left 08/14/2017   Procedure: CATARACT EXTRACTION PHACO AND INTRAOCULAR LENS PLACEMENT (IOC);  Surgeon: Fabio Pierce, MD;  Location: [facility];  Service: Ophthalmology;  Laterality: Left;  CDE: 2.74  . CHOLECYSTECTOMY    . CYSTOSCOPY     stone  . DOPPLER ECHOCARDIOGRAPHY  2009  . head up tilt table testing  06/15/2007   [name]  . HEMORRHOID SURGERY    . insertion of implatable loop recorder  08/11/2007   [name]  . POSTERIOR CERVICAL FUSION/FORAMINOTOMY N/A 12/19/2013   Procedure: RIGHT C3-4.C4-5 AND C5-6 FORAMINOTOMIES;  Surgeon: Kerrin Champagne, MD;  Location: [facility];  Service: Orthopedics;  Laterality: N/A;  . TUBAL LIGATION      There were no vitals filed for this visit.   Subjective Assessment - 12/22/17 1248    Subjective  The patient reports blacking out on 12/09/17  and falling down stairs landing on back.  She reports a 9/10 pain-level today.  Her CC is pain across her low back region due to a blackout.  Prolonged sitting increases her pain.    Pertinent History  OP, cervical and lumbar surgery; Pacemaker ("dead battery").    Limitations  Sitting    How long can you sit comfortably?  10-15 minutes.    How long can you walk comfortably?  Short distances.    Patient Stated Goals  Reduce pain so I can start walking for exercise again.    Currently in Pain?  Yes    Pain Score  9     Pain Location  Back    Pain Orientation  Right;Left;Lower    Pain Descriptors / Indicators  Aching;Throbbing    Pain Type  Chronic pain    Aggravating Factors   Buttocks.    Pain Relieving Factors  Patient has not found much to help decrease her pain at this point.         [facility] PT Assessment - 12/22/17 0001      Assessment    Medical Diagnosis  Right low back pain.    Referring Provider  Lavada Mesi MD    Onset Date/Surgical Date  --   12/09/17 (fall).     Precautions   Precautions  --   Pacemaker ('Dead battery").  Please be with pt at all times.     Restrictions   Weight Bearing Restrictions  No      Balance Screen   Has the patient fallen in the past 6 months  Yes   "Too many to count."   Has the patient had a decrease in activity level because of a fear of falling?   No    Is the patient reluctant to leave their home because of a fear of falling?   No      Home Environment   Living Environment  Private residence      Prior Function   Level of Independence  Independent      Posture/Postural Control   Posture/Postural Control  Postural limitations    Postural Limitations  Rounded Shoulders;Forward head;Decreased lumbar lordosis;Increased thoracic kyphosis;Flexed trunk    Posture Comments  --   Scoliosis.     ROM / Strength   AROM / PROM / Strength  AROM;Strength      AROM   Overall AROM Comments  Patient standing in 25 degrees of lumbar flexion.  She had no further flexion and could only achieve -15 degrees of flexion when attempting to extend.      Strength   Overall Strength Comments  Not able to accurately assess LE strength to pain complaints.      Palpation   Palpation comment  CC today with palpable pain at level L3-4 and bilaterally across from this region.  Her lumbar musculature is remarkable for very significant spasming.      Special Tests   Other special tests  Normal Patellar reflexes, Achilles= 1+/4+; (=) leg lengths; (+) SLR testing.        Bed Mobility   Bed Mobility  --   Painful transitions from supine to sit to stand.     Ambulation/Gait   Gait Comments  Patient walking in  aa flexed trunk posture with a straight cane.  She appeasr to be in obvious pain.  Her gait pattern is slow and cautious with a decrease step/stride length.  Objective measurements completed on examination: See above findings.                PT Short Term Goals - 12/22/17 1353      PT SHORT TERM GOAL #1   Title  STG's=LTG's.        PT Long Term Goals - 12/22/17 1353      PT LONG TERM GOAL #1   Title  Independent with an HEP.    Time  6    Period  Weeks    Status  New      PT LONG TERM GOAL #2   Title  Patient stand in an erect posture.    Time  6    Period  Weeks    Status  New      PT LONG TERM GOAL #3   Title  Patient perform low-level ADL's with pain not > 5/10.    Time  6    Period  Weeks    Status  New             Plan - 12/22/17 1333    Clinical Impression Statement  The patient presents to OPPT with c/o low back pain after a fall down stairs on 12/09/17 after blacking out.  She reports pain to palpation at L3-4 and bilaterally over the lumbar musculature which is very taut to palpation.  She has multiple postural abnormalites and her spinal range of motion is severely limited.  Her high pain-level and deficits have greatly impaired her functional mobility.  Her gait is remarkable for antalgia and she is using a straight cane.  Plan to treat patient conservatively with focus on reducing lumbar musculature pain and tone.    History and Personal Factors relevant to plan of care:  OP, cervical and lumbar surgery; Pacemaker ("dead battery").    Clinical Presentation  Evolving    Clinical Decision Making  Low    Rehab Potential  Fair    Clinical Impairments Affecting Rehab Potential  Chronicity.    PT Frequency  2x / week    PT Duration  6 weeks    PT Treatment/Interventions  ADLs/Self Care Home Management;Cryotherapy;Moist Heat;Therapeutic activities;Therapeutic exercise;Patient/family education;Manual techniques    PT Next Visit Plan  Focus on STW/M to lumbar muscualture.  Progress with low-level ther ex per patient tolerance.    Consulted and Agree with Plan of Care  Patient        Patient will benefit from skilled therapeutic intervention in order to improve the following deficits and impairments:  Pain, Abnormal gait, Decreased activity tolerance, Decreased range of motion, Increased muscle spasms, Postural dysfunction  Visit Diagnosis: Abnormal posture - Plan: PT plan of care cert/re-cert  Chronic right-sided low back pain, with sciatica presence unspecified - Plan: PT plan of care cert/re-cert     Problem List Patient Active Problem List   Diagnosis Date Noted  . Unstable angina (HCC) 10/20/2017  . Encounter for chronic pain management 09/23/2016  . Pre-diabetes 09/02/2016  . Recurrent major depressive disorder, in partial remission (HCC) 07/14/2016  . Seizures (HCC) 07/14/2016  . GAD (generalized anxiety disorder) 07/14/2016  . Insomnia 09/05/2014  . Allergic rhinitis 09/05/2014  . Cervical spondylosis without myelopathy 12/19/2013    Class: Chronic  . Neural foraminal stenosis of cervical spine 12/19/2013  . Cervical radiculitis 09/19/2013  . Lumbosacral spondylosis without myelopathy 11/16/2012  . Postlaminectomy syndrome, lumbar region 11/16/2012  . Herpes simplex virus (HSV) infection 10/31/2008  . Hyperlipidemia with target LDL  less than 100 10/31/2008  . Essential hypertension 10/31/2008  . Asthma 10/31/2008  . GERD 10/31/2008  . SPINAL STENOSIS OF LUMBAR REGION 10/31/2008  . Myalgia 10/31/2008  . Osteoporosis 10/31/2008  . SPONDYLOLISTHESIS 10/31/2008  . SYNCOPE 10/31/2008  . Sleep apnea 10/31/2008    Jassica Zazueta, [place] MPT 12/22/2017, 1:56 PM  [facility] [address] [place], [place], [address] Phone: [phone]   Fax:  [phone]  Name: BLAIZE NIPPER MRN: [id number] Date of Birth: 04-12-1955

## 2017-12-22 NOTE — Progress Notes (Signed)
Subjective:    Patient ID: [name], female    DOB: [birth year], 62 y.o.   MRN: [id number]  Cough  This is a new problem. The current episode started in the past 7 days. The problem has been rapidly worsening. The problem occurs every few minutes. The cough is non-productive. Associated symptoms include shortness of breath and wheezing. Pertinent negatives include no chest pain, chills, ear congestion, ear pain, fever, headaches, heartburn or sore throat. Nothing aggravates the symptoms. She has tried OTC inhaler, a beta-agonist inhaler and steroid inhaler for the symptoms. The treatment provided no relief. Her past medical history is significant for asthma, bronchitis and pneumonia.  Wheezing   Associated symptoms include coughing and shortness of breath. Pertinent negatives include no chest pain, chills, ear pain, fever, headaches or sore throat. Her past medical history is significant for asthma and pneumonia.      Review of Systems  Constitutional: Negative.  Negative for chills and fever.  HENT: Negative.  Negative for ear pain and sore throat.   Eyes: Negative.   Respiratory: Positive for cough, shortness of breath and wheezing.   Cardiovascular: Negative for chest pain.  Gastrointestinal: Negative.  Negative for heartburn.  Endocrine: Negative.   Genitourinary: Negative.   Musculoskeletal: Negative.   Skin: Negative.   Allergic/Immunologic: Negative.   Neurological: Negative.  Negative for headaches.  Hematological: Negative.   Psychiatric/Behavioral: Negative.   All other systems reviewed and are negative.      Objective:   Physical Exam  Constitutional: She is oriented to person, place, and time. She appears well-developed and well-nourished.  HENT:  Head: Normocephalic and atraumatic.  Right Ear: External ear normal.  Left Ear: External ear normal. No tenderness.  Nose: Nose normal.  Mouth/Throat: Posterior oropharyngeal erythema present.  Left ear canal  erythema  Eyes: Pupils are equal, round, and reactive to light. Conjunctivae are normal.  Neck: Normal range of motion. Neck supple.  Cardiovascular: Normal rate and regular rhythm.  Pulmonary/Chest: Effort normal. No stridor. No respiratory distress. She has decreased breath sounds in the left lower field. She has no wheezes. She has no rhonchi. She has no rales.  Deep dry cough  Abdominal: Soft. Bowel sounds are normal.  Musculoskeletal: Normal range of motion.  Neurological: She is alert and oriented to person, place, and time.  Skin: Skin is warm.  Psychiatric: She has a normal mood and affect. Her behavior is normal.  Nursing note and vitals reviewed.   BP (!) 169/87   Pulse 80   Temp 98.1 F (36.7 C) (Oral)   Ht 5\' 2"  (1.575 m)   Wt 145 lb (65.8 kg)   SpO2 95%   BMI 26.52 kg/m      Assessment & Plan:  [name] in today with chief complaint of Cough (x 3-4 days) and Wheezing   1. Cough   2. Acute bronchitis with COPD (HCC) 1. Take meds as prescribed 2. Use a cool mist humidifier especially during the winter months and when heat has been humid. 3. Use saline nose sprays frequently 4. Saline irrigations of the nose can be very helpful if done frequently.  * 4X daily for 1 week*  * Use of a nettie pot can be helpful with this. Follow directions with this* 5. Drink plenty of fluids 6. Keep thermostat turn down low 7.For any cough or congestion  Use plain Mucinex- regular strength or max strength is fine   * Children- consult with Pharmacist for dosing  8. For fever or aces or pains- take tylenol or ibuprofen appropriate for age and weight.  * for fevers greater than 101 orally you may alternate ibuprofen and tylenol every  3 hours.    - benzonatate (TESSALON PERLES) 100 MG capsule; Take 1 capsule (100 mg total) by mouth 3 (three) times daily as needed for cough.  Dispense: 20 capsule; Refill: 0 - predniSONE (DELTASONE) 20 MG tablet; 2 po at sametime daily for  5 days  Dispense: 10 tablet; Refill: 0  [name] Daphine Deutscher, FNP

## 2017-12-22 NOTE — Patient Instructions (Signed)

## 2017-12-24 ENCOUNTER — Encounter: Payer: 59 | Admitting: Physical Therapy

## 2017-12-28 ENCOUNTER — Other Ambulatory Visit: Payer: Self-pay | Admitting: Nurse Practitioner

## 2017-12-28 ENCOUNTER — Other Ambulatory Visit: Payer: Self-pay | Admitting: Internal Medicine

## 2017-12-28 DIAGNOSIS — N941 Unspecified dyspareunia: Secondary | ICD-10-CM

## 2017-12-28 DIAGNOSIS — F5101 Primary insomnia: Secondary | ICD-10-CM

## 2017-12-28 DIAGNOSIS — K2100 Gastro-esophageal reflux disease with esophagitis, without bleeding: Secondary | ICD-10-CM

## 2017-12-28 DIAGNOSIS — B009 Herpesviral infection, unspecified: Secondary | ICD-10-CM

## 2017-12-31 ENCOUNTER — Other Ambulatory Visit: Payer: Self-pay | Admitting: Nurse Practitioner

## 2018-01-01 ENCOUNTER — Encounter: Payer: 59 | Admitting: Physical Therapy

## 2018-01-04 ENCOUNTER — Ambulatory Visit (INDEPENDENT_AMBULATORY_CARE_PROVIDER_SITE_OTHER): Payer: 59 | Admitting: Cardiology

## 2018-01-04 ENCOUNTER — Telehealth: Payer: Self-pay | Admitting: Nurse Practitioner

## 2018-01-04 ENCOUNTER — Encounter: Payer: Self-pay | Admitting: Cardiology

## 2018-01-04 VITALS — BP 145/85 | HR 55 | Ht 62.0 in | Wt 147.4 lb

## 2018-01-04 DIAGNOSIS — R55 Syncope and collapse: Secondary | ICD-10-CM

## 2018-01-04 DIAGNOSIS — R0789 Other chest pain: Secondary | ICD-10-CM

## 2018-01-04 MED ORDER — FLUDROCORTISONE ACETATE 0.1 MG PO TABS: 0.3000 mg | ORAL_TABLET | Freq: Every day | ORAL | 6 refills | Status: DC

## 2018-01-04 NOTE — Telephone Encounter (Signed)
Ok to stop zanaflex. Should not ned anything to replace it. She is on valium which ill serve as muscle relaxer

## 2018-01-04 NOTE — Progress Notes (Signed)
Clinical Summary [name] is a 62 y.o.female seen today for follow up of the following medical problems.  1. Chest pain - several year history of chest pain - normal cath in 2013, normal stress test 2013 - admit to [facility] 07/2014 with chest pain. Negative workup for ACS. She was discharged with an out patient Lexiscan which showed no evidence of ischemia.  - echo 11/2014 LVEF 55-60%, no WMAs.  - 09/2017 nuclear stress no ischemia.  - started on imdur by pcp with improved symptoms.     2. 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 Got out of bed to bathroom, in bathroom felt lightheaded and dizzy. Feel to floor, + LOC. Unsure how long she was out for. Daughter got her back into bed, checked bp and it was 60/52.   12/09/2017  admitted to [facility] with episode, reprorts low bp's at that time.   - takes coreg about 2-3 days a week for severely elevated bp's. Has not taken lasix.          Past Medical History:  Diagnosis Date  . Anginal pain (HCC)    last time   . Anxiety   . Arthritis    RHEUMATOID  . Asthma   . Bipolar 1 disorder (HCC)   . Cataracts, bilateral 07/2017  . COPD (chronic obstructive pulmonary disease) (HCC)   . Coronary artery disease    reported hx of "MI";  Echo 2009 with normal LVF;  Myoview 05/2011: no ischemia  . Depression   . Dyslipidemia   . Dysrhythmia    SVT  . Esophageal stricture   . Fibromyalgia   . GERD (gastroesophageal reflux disease)   . H/O hiatal hernia   . Head injury, unspecified   . Herpes simplex infection   . History of kidney stones   . Hyperlipidemia   . Hypertension   . Insomnia   . Myocardial infarction [facility])    age 27  . Osteoporosis   .  Pneumonia    hx  . Seizures (HCC)    last 3 weeks ago- prescribed keppra -never took didnt like side effects read about online  . Shortness of breath   . Sleep apnea    ? neg  . Spinal stenosis of lumbar region   . Spondylolisthesis   . Status post placement of implantable loop recorder   . Supraventricular tachycardia (HCC)   . Syncope and collapse    s/p ILR; no arhythmogenic cause identified  . UTI (lower urinary tract infection)      Allergies  Allergen Reactions  . Codeine Other (See Comments)    "I will have a heart attack."  . Morphine And Related Other (See Comments)    "It will cause me to have a heart attack."  . Ambien [Zolpidem Tartrate] Nausea And Vomiting  . Lyrica [Pregabalin] Swelling and Other (See Comments)    Weight gain  . Neurontin [Gabapentin] Other (See Comments)    Causes elevated LFTs      Current Outpatient Medications  Medication Sig Dispense Refill  . acyclovir ointment (ZOVIRAX) 5 % APPLY TO AFFECTED AREA EVERY 3 HOURS 30 g 2  . albuterol (PROVENTIL) (2.5 MG/3ML) 0.083% nebulizer solution USE 1 VIAL IN NEBULIZER EVERY 4 HOURS AS  NEEDED FOR WHEEZING. 180 mL 2  . albuterol (VENTOLIN HFA) 108 (90 Base) MCG/ACT inhaler INHALE 2 PUFFS EVERY 6 HOURS AS NEEDED FOR SHORTNESS OF BREATH AND WHEEZING. 18 g 5  . Alcohol Swabs (ALCOHOL PADS) 70 % PADS USE 1 PAD DAILY WHEN CHECKING BLOOD SUGAR. 100 each 2  . ALLERGY 10 MG tablet TAKE 1 TABLET ONCE DAILY. 30 tablet 4  . baclofen (LIORESAL) 10 MG tablet Take 1 tablet (10 mg total) by mouth 2 (two) times daily as needed for muscle spasms. 30 each 0  . benzonatate (TESSALON PERLES) 100 MG capsule Take 1 capsule (100 mg total) by mouth 3 (three) times daily as needed for cough. 20 capsule 0  . busPIRone (BUSPAR) 10 MG tablet TAKE (1) TABLET TWICE DAILY. 60 tablet 2  . butalbital-acetaminophen-caffeine (FIORICET, ESGIC) 50-325-40 MG tablet TAKE 1 TABLET TWICE DAILY AS NEEDED FOR HEADACHE. 20 tablet 1  .  butalbital-acetaminophen-caffeine (FIORICET, ESGIC) 50-325-40 MG tablet TAKE 1 TABLET TWICE DAILY AS NEEDED FOR HEADACHE. 20 tablet 0  . carvedilol (COREG) 3.125 MG tablet IF SYSTOLIC<130 DO NOT TAKE, IF SYSTOLIC 130-150 TAKE 1 TABLET 2 TIMES A DAY. IF SYSTOLIC >150 TAKE 2 TABLETS 2 TIMES A DAY. 120 tablet 4  . DEXILANT 60 MG capsule TAKE 1 CAPSULE DAILY. 30 capsule 2  . diazepam (VALIUM) 5 MG tablet TAKE 1 TABLET EVERY 12 HOURS AS NEEDED FOR ANXIETY 60 tablet 2  . DULoxetine (CYMBALTA) 60 MG capsule Take 1 capsule (60 mg total) by mouth at bedtime. 30 capsule 5  . escitalopram (LEXAPRO) 20 MG tablet TAKE (1) TABLET BY MOUTH ONCE DAILY. 30 tablet 5  . estradiol (ESTRACE) 0.1 MG/GM vaginal cream PLACE 1 APPLICATORFUL VAGINALLY AT BEDTIME. 42.5 g 0  . fludrocortisone (FLORINEF) 0.1 MG tablet Take 2 tablets (0.2 mg total) by mouth daily. 180 tablet 1  . fluticasone (FLONASE) 50 MCG/ACT nasal spray Place 2 sprays into both nostrils daily. 16 g 6  . fluticasone furoate-vilanterol (BREO ELLIPTA) 200-25 MCG/INH AEPB Inhale 1 puff into the lungs daily. 60 each 5  . furosemide (LASIX) 40 MG tablet TAKE 1 TABLET AS NEEDED FOR SWELLING 90 tablet 1  . glucose blood (ONETOUCH VERIO) test strip Test blood sugar daily 100 each 3  . [START ON 02/18/2018] HYDROcodone-acetaminophen (NORCO) 7.5-325 MG tablet Take 1 tablet by mouth 2 (two) times daily. 60 tablet 0  . [START ON 01/19/2018] HYDROcodone-acetaminophen (NORCO) 7.5-325 MG tablet Take 1 tablet by mouth 2 (two) times daily. 60 tablet 0  . HYDROcodone-acetaminophen (NORCO) 7.5-325 MG tablet Take 1 tablet by mouth 2 (two) times daily. 60 tablet 0  . ipratropium (ATROVENT) 0.02 % nebulizer solution USE 1 VIAL IN NEBULIZER EVERY 4 HOURS AS NEEDED FOR WHEEZING. 150 mL 1  . isosorbide mononitrate (IMDUR) 30 MG 24 hr tablet Take 1 tablet (30 mg total) by mouth daily. 30 tablet 6  . lamoTRIgine (LAMICTAL) 150 MG tablet TAKE (1) TABLET BY MOUTH ONCE DAILY. 30 tablet 2   . levETIRAcetam (KEPPRA) 500 MG tablet TAKE (1) TABLET TWICE DAILY. 60 tablet 5  . levocetirizine (XYZAL) 5 MG tablet TAKE 1 TABLET BY MOUTH EVERY MORNING. 30 tablet 4  . miconazole (MICOTIN) 2 % cream Apply 1 application topically 2 (two) times daily. 28.35 g 0  . nitroGLYCERIN (NITROSTAT) 0.4 MG SL tablet PLACE ONE (1) TABLET UNDER TONGUE EVERY 5 MINUTES UP TO (3) DOSES AS NEEDED FOR CHEST PAIN. 25 tablet 0  . ondansetron (ZOFRAN) 4 MG tablet TAKE  1 TO 2 TABLETS AS NEEDED FOR NAUSEA. 20 tablet 0  . ONETOUCH DELICA LANCETS 33G MISC TEST BLOOD SUGAR ONCE DAILY. 100 each 3  . oxybutynin (DITROPAN) 5 MG tablet TAKE (1) TABLET TWICE DAILY. 60 tablet 5  . predniSONE (DELTASONE) 20 MG tablet 2 po at sametime daily for 5 days 10 tablet 0  . ranitidine (ZANTAC) 150 MG tablet TAKE 1 TABLET BY MOUTH AT BEDTIME. 30 tablet 2  . rosuvastatin (CRESTOR) 10 MG tablet TAKE (1) TABLET BY MOUTH ONCE DAILY. 30 tablet 5  . sucralfate (CARAFATE) 1 GM/10ML suspension Take 10 mLs (1 g total) by mouth 2 (two) times daily. 420 mL 6  . tiZANidine (ZANAFLEX) 4 MG tablet TAKE (1) TABLET EVERY SIX HOURS AS NEEDED FOR MUSCLE SPASMS. 30 tablet 5  . tolterodine (DETROL) 2 MG tablet TAKE (1) TABLET TWICE DAILY. 60 tablet 5  . traZODone (DESYREL) 150 MG tablet TAKE 1 TABLET BY MOUTH AT BEDTIME. 30 tablet 2  . valACYclovir (VALTREX) 500 MG tablet TAKE (1) TABLET TWICE DAILY. 60 tablet 1   Current Facility-Administered Medications  Medication Dose Route Frequency Provider Last Rate Last Dose  . triamcinolone acetonide (KENALOG-40) injection 40 mg  40 mg Other Once Erica Sheriff, MD      . triamcinolone acetonide (KENALOG-40) injection 40 mg  40 mg Other Once Erica Sheriff, MD         Past Surgical History:  Procedure Laterality Date  . BACK SURGERY    . BREAST SURGERY     lumpectomy  . CATARACT EXTRACTION W/PHACO Right 07/31/2017   Procedure: CATARACT EXTRACTION PHACO AND INTRAOCULAR LENS PLACEMENT (IOC);  Surgeon:  Fabio Pierce, MD;  Location: [facility];  Service: Ophthalmology;  Laterality: Right;  CDE: 2.33  . CATARACT EXTRACTION W/PHACO Left 08/14/2017   Procedure: CATARACT EXTRACTION PHACO AND INTRAOCULAR LENS PLACEMENT (IOC);  Surgeon: Fabio Pierce, MD;  Location: [facility];  Service: Ophthalmology;  Laterality: Left;  CDE: 2.74  . CHOLECYSTECTOMY    . CYSTOSCOPY     stone  . DOPPLER ECHOCARDIOGRAPHY  2009  . head up tilt table testing  06/15/2007   [name]  . HEMORRHOID SURGERY    . insertion of implatable loop recorder  08/11/2007   [name]  . POSTERIOR CERVICAL FUSION/FORAMINOTOMY N/A 12/19/2013   Procedure: RIGHT C3-4.C4-5 AND C5-6 FORAMINOTOMIES;  Surgeon: Kerrin Champagne, MD;  Location: [facility] OR;  Service: Orthopedics;  Laterality: N/A;  . TUBAL LIGATION       Allergies  Allergen Reactions  . Codeine Other (See Comments)    "I will have a heart attack."  . Morphine And Related Other (See Comments)    "It will cause me to have a heart attack."  . Ambien [Zolpidem Tartrate] Nausea And Vomiting  . Lyrica [Pregabalin] Swelling and Other (See Comments)    Weight gain  . Neurontin [Gabapentin] Other (See Comments)    Causes elevated LFTs       Family History  Problem Relation Age of Onset  . Heart attack Father   . Cancer Father   . Mental illness Father   . Cancer Mother   . Mental illness Mother   . Heart attack Brother        stents  . Alcohol abuse Brother   . Heart disease Brother   . Drug abuse Brother   . Diabetes Brother   . Colon cancer Maternal Aunt   . Cirrhosis Brother   . Stomach cancer Neg  Hx      Social History Ms. Stratmann reports that she has never smoked. She has never used smokeless tobacco. Ms. Bridgeforth reports that she does not drink alcohol.   Review of Systems CONSTITUTIONAL: No weight loss, fever, chills, weakness or fatigue.  HEENT: Eyes: No visual loss, blurred vision, double vision or yellow sclerae.No hearing loss, sneezing, congestion,  runny nose or sore throat.  SKIN: No rash or itching.  CARDIOVASCULAR: per hpi RESPIRATORY: No shortness of breath, cough or sputum.  GASTROINTESTINAL: No anorexia, nausea, vomiting or diarrhea. No abdominal pain or blood.  GENITOURINARY: No burning on urination, no polyuria NEUROLOGICAL: per hpi.  MUSCULOSKELETAL: No muscle, back pain, joint pain or stiffness.  LYMPHATICS: No enlarged nodes. No history of splenectomy.  PSYCHIATRIC: No history of depression or anxiety.  ENDOCRINOLOGIC: No reports of sweating, cold or heat intolerance. No polyuria or polydipsia.  [name]   Physical Examination Vitals:   01/04/18 1000  BP: (!) 145/85  Pulse: (!) 55   Vitals:   01/04/18 1000  Weight: 147 lb 6.4 oz (66.9 kg)  Height: 5\' 2"  (1.575 m)    Gen: resting comfortably, no acute distress HEENT: no scleral icterus, pupils equal round and reactive, no palptable cervical adenopathy,  CV: RRR, no m/r/g, no jvd Resp: Clear to auscultation bilaterally GI: abdomen is soft, non-tender, non-distended, normal bowel sounds, no hepatosplenomegaly MSK: extremities are warm, no edema.  Skin: warm, no rash Neuro:  no focal deficits Psych: appropriate affect   Diagnostic Studies 05/2011 Lexiscan MPI No ischemia   Jan 2009 Echo LEFT VENTRICLE: - Left ventricular size was normal. - Overall left ventricular systolic function was normal. - There were no left ventricular regional wall motion abnormalities. - Left ventricular wall thickness was normal.  AORTIC VALVE: - The aortic valve was trileaflet. - Aortic valve thickness was normal.  AORTA: - The aortic root was normal in size. - The aortic arch was normal.  MITRAL VALVE: - Mitral valve structure was normal.  Doppler interpretation(s): - There was trivial mitral valvular regurgitation.  LEFT ATRIUM: - Left atrial size was normal.  PULMONARY VEINS: - The pulmonary veins were grossly normal.  RIGHT VENTRICLE: - Right ventricular  size was normal. - Right ventricular systolic function was normal. - Right ventricular wall thickness was normal.  PULMONIC VALVE: - The structure of the pulmonic valve appeared to be normal.  TRICUSPID VALVE: - The tricuspid valve structure was normal.  Doppler interpretation(s): - There was no significant tricuspid valvular regurgitation.  PULMONARY ARTERY: - The pulmonary artery was normal size.  RIGHT ATRIUM: - Right atrial size was normal.  SYSTEMIC VEINS: - The inferior vena cava was normal.  PERICARDIUM: - There was no pericardial effusion.  ---------------------------------------------------------------  SUMMARY - Overall left ventricular systolic function was normal. There were no left ventricular regional wall motion abnormalities. - The pulmonary veins were grossly normal.   09/2011 Cath Hemodynamic Findings: Central aortic pressure: 108/58  Left ventricular pressure: 107/10/14  Angiographic Findings:  Left main: No obstructive disease noted.  Left Anterior Descending Artery: Moderate to large sized vessel that courses to the apex. There is a moderate sized diagonal Jasher Barkan. No obstructive disease noted.  Circumflex Artery: Large, dominant artery with moderate sized first obtuse marginal Jak Haggar and left sided posterolateral Latera Mclin with no disease noted.  Right Coronary Artery: Small, non-dominant vessel with no disease noted.  Left Ventricular Angiogram:LVEF=65%.  Impression:  1. No angiographic evidence of CAD  2. Normal LV systolic function  3.  Non-cardiac chest pain  Recommendations: No further ischemic workup.  Complications: None. The patient tolerated the procedure well.   11/2014 echo Study Conclusions  - Left ventricle: The cavity size was normal. Systolic function was normal. The estimated ejection fraction was in the range of 55% to 60%. Wall motion was normal; there were no regional wall motion abnormalities. There  was an increased relative contribution of atrial contraction to ventricular filling. Doppler parameters are consistent with abnormal left ventricular relaxation (grade 1 diastolic dysfunction). - Mitral valve: There was trivial regurgitation. - Tricuspid valve: There was trivial regurgitation. - Pulmonic valve: There was trivial regurgitation.   09/2017 nuclear stress  There was no ST segment deviation noted during stress. Nonspecific T wave flattening in aVL and V2 seen throughout study.  Defect 1: There is a medium defect of mild severity present in the mid inferior, apical inferior and apical lateral location. This appears to be due to soft tissue attenuation given normal regional wall motion. No ischemic territories.  This is a low risk study.  Nuclear stress EF: 65%.    Assessment and Plan   1. Chest pain - long history of symptoms with multiple negative stress tests, most recently 09/2017  Lexiscan was negative. Cath 2013 with patent vessels -- symptoms improved with imdur - continue to monitor.   2. Syncope -history of autonomic insufficiency - polypharmacy is likely exacerbating this , asked to discuss with her pcp streamlining her medications.Particularly her zanaflex. She is also on benzos, opiates, imdur, ditropan  She is reluctant to lower or adjust imdur dosing. Will increase florinef to 0.3mg  daily. - refer to [facility].       Antoine Poche, M.D

## 2018-01-04 NOTE — Patient Instructions (Signed)
Medication Instructions:   Increase Florinef to 0.3mg  daily.   Continue all other medications.    Labwork: none  Testing/Procedures: none  Follow-Up: 3 months   Any Other Special Instructions Will Be Listed Below (If Applicable). You have been referred to:  [facility]  If you need a refill on your cardiac medications before your next appointment, please call your pharmacy.

## 2018-01-04 NOTE — Telephone Encounter (Signed)
PT states that she just left cardiologist and he is wanting her to stop taking that tiZANidine (ZANAFLEX) 4 MG tablet due to making BP drop and put on something else. Pt wasn't sure if she needs an apt Pharmacy [facility]

## 2018-01-04 NOTE — Telephone Encounter (Signed)
Patient aware to stop medication 

## 2018-01-04 NOTE — Telephone Encounter (Signed)
Please review and advise.

## 2018-01-12 ENCOUNTER — Other Ambulatory Visit: Payer: Self-pay | Admitting: *Deleted

## 2018-01-12 DIAGNOSIS — R55 Syncope and collapse: Secondary | ICD-10-CM

## 2018-01-13 NOTE — Telephone Encounter (Signed)
Patient aware per different telephone encounter.  This encounter will now be closed

## 2018-01-26 ENCOUNTER — Other Ambulatory Visit: Payer: Self-pay | Admitting: Nurse Practitioner

## 2018-01-26 DIAGNOSIS — N941 Unspecified dyspareunia: Secondary | ICD-10-CM

## 2018-01-28 ENCOUNTER — Telehealth: Payer: Self-pay

## 2018-01-28 MED ORDER — SUCRALFATE 1 GM/10ML PO SUSP: 1.0000 g | Freq: Two times a day (BID) | ORAL | 2 refills | Status: DC

## 2018-01-28 NOTE — Telephone Encounter (Signed)
Incoming fax request for refills on Carafate 1GM/10 ml BID. ( ) last seen 05-2017.

## 2018-01-28 NOTE — Telephone Encounter (Signed)
Pt aware. No follow up scheduled at this time.

## 2018-01-28 NOTE — Telephone Encounter (Signed)
I refilled it for 2 months.  After that, see me or, if easier for patient, have primary care prescribe it.

## 2018-02-01 ENCOUNTER — Other Ambulatory Visit: Payer: Self-pay | Admitting: Internal Medicine

## 2018-02-09 ENCOUNTER — Ambulatory Visit (INDEPENDENT_AMBULATORY_CARE_PROVIDER_SITE_OTHER): Payer: 59

## 2018-02-09 ENCOUNTER — Encounter: Payer: Self-pay | Admitting: Family Medicine

## 2018-02-09 ENCOUNTER — Ambulatory Visit (INDEPENDENT_AMBULATORY_CARE_PROVIDER_SITE_OTHER): Payer: 59 | Admitting: Family Medicine

## 2018-02-09 VITALS — BP 152/97 | HR 103 | Temp 97.8°F | Ht 62.0 in | Wt 150.0 lb

## 2018-02-09 DIAGNOSIS — R058 Other specified cough: Secondary | ICD-10-CM

## 2018-02-09 DIAGNOSIS — R05 Cough: Secondary | ICD-10-CM

## 2018-02-09 DIAGNOSIS — J441 Chronic obstructive pulmonary disease with (acute) exacerbation: Secondary | ICD-10-CM

## 2018-02-09 MED ORDER — AZITHROMYCIN 250 MG PO TABS: ORAL_TABLET | ORAL | 0 refills | Status: DC

## 2018-02-09 MED ORDER — FLUCONAZOLE 150 MG PO TABS: 150.0000 mg | ORAL_TABLET | Freq: Once | ORAL | 0 refills | Status: AC

## 2018-02-09 MED ORDER — PREDNISONE 20 MG PO TABS: 40.0000 mg | ORAL_TABLET | Freq: Every day | ORAL | 0 refills | Status: AC

## 2018-02-09 NOTE — Patient Instructions (Signed)
We obtained a chest xray today because of your prolonged cough.   Use your Albuterol every 6 hours for the next 2 days then as needed as directed.  I have prescribed Prednisone for you to take once daily for the next 5 days and a Zpak, which you will take for 4 days but will stay in your system for about 2 weeks.  Follow up with [name] in 2 weeks for recheck.  If your cough persists, she may consider having you see a specialist.   Cough, Adult A cough helps to clear your throat and lungs. A cough may last only 2-3 weeks (acute), or it may last longer than 8 weeks (chronic). Many different things can cause a cough. A cough may be a sign of an illness or another medical condition. Follow these instructions at home:  Pay attention to any changes in your cough.  Take medicines only as told by your doctor. ? If you were prescribed an antibiotic medicine, take it as told by your doctor. Do not stop taking it even if you start to feel better. ? Talk with your doctor before you try using a cough medicine.  Drink enough fluid to keep your pee (urine) clear or pale yellow.  If the air is dry, use a cold steam vaporizer or humidifier in your home.  Stay away from things that make you cough at work or at home.  If your cough is worse at night, try using extra pillows to raise your head up higher while you sleep.  Do not smoke, and try not to be around smoke. If you need help quitting, ask your doctor.  Do not have caffeine.  Do not drink alcohol.  Rest as needed. Contact a doctor if:  You have new problems (symptoms).  You cough up yellow fluid (pus).  Your cough does not get better after 2-3 weeks, or your cough gets worse.  Medicine does not help your cough and you are not sleeping well.  You have pain that gets worse or pain that is not helped with medicine.  You have a fever.  You are losing weight and you do not know why.  You have night sweats. Get help right away  if:  You cough up blood.  You have trouble breathing.  Your heartbeat is very fast. This information is not intended to replace advice given to you by your health care provider. Make sure you discuss any questions you have with your health care provider. Document Released: 11/28/2010 Document Revised: 08/23/2015 Document Reviewed: 05/24/2014 Elsevier Interactive Patient Education  [employer].

## 2018-02-09 NOTE — Progress Notes (Signed)
Subjective: CC: Persistent cough PCP: Bennie Pierini, FNP VEH:[id number] [name] is a 62 y.o. female presenting to clinic today for:  1.  Persistent cough Patient reports persistent cough ongoing since September.  She was treated for a COPD exacerbation at that time with prednisone and Tessalon Perles.  She notes that she was unable to for the [employer].  She reports scant hemoptysis recently but states that she also had bloody discharge in her nares.  She reports fevers at home to 99 to 100 F.  She reports associated fatigue and reports that her phlegm is yellow in color.  She has had wheezes that are not resolved with albuterol and Breo.  Denies any smoking but does note she is exposed to secondhand smoke.   ROS: Per HPI  Allergies  Allergen Reactions  . Codeine Other (See Comments)    "I will have a heart attack."  . Morphine And Related Other (See Comments)    "It will cause me to have a heart attack."  . Ambien [Zolpidem Tartrate] Nausea And Vomiting  . Lyrica [Pregabalin] Swelling and Other (See Comments)    Weight gain  . Neurontin [Gabapentin] Other (See Comments)    Causes elevated LFTs    Past Medical History:  Diagnosis Date  . Anginal pain (HCC)    last time   . Anxiety   . Arthritis    RHEUMATOID  . Asthma   . Bipolar 1 disorder (HCC)   . Cataracts, bilateral 07/2017  . COPD (chronic obstructive pulmonary disease) (HCC)   . Coronary artery disease    reported hx of "MI";  Echo 2009 with normal LVF;  Myoview 05/2011: no ischemia  . Depression   . Dyslipidemia   . Dysrhythmia    SVT  . Esophageal stricture   . Fibromyalgia   . GERD (gastroesophageal reflux disease)   . H/O hiatal hernia   . Head injury, unspecified   . Herpes simplex infection   . History of kidney stones   . Hyperlipidemia   . Hypertension   . Insomnia   . Myocardial infarction [facility])    age 17  . Osteoporosis   . Pneumonia    hx  . Seizures (HCC)    last 3 weeks  ago- prescribed keppra -never took didnt like side effects read about online  . Shortness of breath   . Sleep apnea    ? neg  . Spinal stenosis of lumbar region   . Spondylolisthesis   . Status post placement of implantable loop recorder   . Supraventricular tachycardia (HCC)   . Syncope and collapse    s/p ILR; no arhythmogenic cause identified  . UTI (lower urinary tract infection)     Current Outpatient Medications:  .  acyclovir ointment (ZOVIRAX) 5 %, APPLY TO AFFECTED AREA EVERY 3 HOURS, Disp: 30 g, Rfl: 2 .  albuterol (PROVENTIL) (2.5 MG/3ML) 0.083% nebulizer solution, USE 1 VIAL IN NEBULIZER EVERY 4 HOURS AS NEEDED FOR WHEEZING., Disp: 180 mL, Rfl: 2 .  albuterol (VENTOLIN HFA) 108 (90 Base) MCG/ACT inhaler, INHALE 2 PUFFS EVERY 6 HOURS AS NEEDED FOR SHORTNESS OF BREATH AND WHEEZING., Disp: 18 g, Rfl: 5 .  Alcohol Swabs (ALCOHOL PADS) 70 % PADS, USE 1 PAD DAILY WHEN CHECKING BLOOD SUGAR., Disp: 100 each, Rfl: 2 .  ALLERGY 10 MG tablet, TAKE 1 TABLET ONCE DAILY., Disp: 30 tablet, Rfl: 4 .  benzonatate (TESSALON PERLES) 100 MG capsule, Take 1 capsule (100 mg total) by  mouth 3 (three) times daily as needed for cough., Disp: 20 capsule, Rfl: 0 .  busPIRone (BUSPAR) 10 MG tablet, TAKE (1) TABLET TWICE DAILY., Disp: 60 tablet, Rfl: 2 .  butalbital-acetaminophen-caffeine (FIORICET, ESGIC) 50-325-40 MG tablet, TAKE 1 TABLET TWICE DAILY AS NEEDED FOR HEADACHE., Disp: 20 tablet, Rfl: 1 .  butalbital-acetaminophen-caffeine (FIORICET, ESGIC) 50-325-40 MG tablet, TAKE 1 TABLET TWICE DAILY AS NEEDED FOR HEADACHE., Disp: 20 tablet, Rfl: 0 .  carvedilol (COREG) 3.125 MG tablet, IF SYSTOLIC<130 DO NOT TAKE, IF SYSTOLIC 130-150 TAKE 1 TABLET 2 TIMES A DAY. IF SYSTOLIC >150 TAKE 2 TABLETS 2 TIMES A DAY., Disp: 120 tablet, Rfl: 4 .  DEXILANT 60 MG capsule, TAKE 1 CAPSULE DAILY., Disp: 30 capsule, Rfl: 2 .  diazepam (VALIUM) 5 MG tablet, TAKE 1 TABLET EVERY 12 HOURS AS NEEDED FOR ANXIETY, Disp: 60  tablet, Rfl: 2 .  DULoxetine (CYMBALTA) 60 MG capsule, Take 1 capsule (60 mg total) by mouth at bedtime., Disp: 30 capsule, Rfl: 5 .  escitalopram (LEXAPRO) 20 MG tablet, TAKE (1) TABLET BY MOUTH ONCE DAILY., Disp: 30 tablet, Rfl: 5 .  estradiol (ESTRACE) 0.1 MG/GM vaginal cream, PLACE 1 APPLICATORFUL VAGINALLY AT BEDTIME., Disp: 42.5 g, Rfl: 0 .  fludrocortisone (FLORINEF) 0.1 MG tablet, Take 3 tablets (0.3 mg total) by mouth daily., Disp: 90 tablet, Rfl: 6 .  fluticasone (FLONASE) 50 MCG/ACT nasal spray, Place 2 sprays into both nostrils daily., Disp: 16 g, Rfl: 6 .  fluticasone furoate-vilanterol (BREO ELLIPTA) 200-25 MCG/INH AEPB, Inhale 1 puff into the lungs daily., Disp: 60 each, Rfl: 5 .  furosemide (LASIX) 40 MG tablet, TAKE 1 TABLET AS NEEDED FOR SWELLING, Disp: 90 tablet, Rfl: 1 .  glucose blood (ONETOUCH VERIO) test strip, Test blood sugar daily, Disp: 100 each, Rfl: 3 .  [START ON 02/18/2018] HYDROcodone-acetaminophen (NORCO) 7.5-325 MG tablet, Take 1 tablet by mouth 2 (two) times daily., Disp: 60 tablet, Rfl: 0 .  HYDROcodone-acetaminophen (NORCO) 7.5-325 MG tablet, Take 1 tablet by mouth 2 (two) times daily., Disp: 60 tablet, Rfl: 0 .  HYDROcodone-acetaminophen (NORCO) 7.5-325 MG tablet, Take 1 tablet by mouth 2 (two) times daily., Disp: 60 tablet, Rfl: 0 .  ipratropium (ATROVENT) 0.02 % nebulizer solution, USE 1 VIAL IN NEBULIZER EVERY 4 HOURS AS NEEDED FOR WHEEZING., Disp: 150 mL, Rfl: 1 .  isosorbide mononitrate (IMDUR) 30 MG 24 hr tablet, Take 1 tablet (30 mg total) by mouth daily., Disp: 30 tablet, Rfl: 6 .  lamoTRIgine (LAMICTAL) 150 MG tablet, TAKE (1) TABLET BY MOUTH ONCE DAILY., Disp: 30 tablet, Rfl: 2 .  levETIRAcetam (KEPPRA) 500 MG tablet, TAKE (1) TABLET TWICE DAILY., Disp: 60 tablet, Rfl: 5 .  levocetirizine (XYZAL) 5 MG tablet, TAKE 1 TABLET BY MOUTH EVERY MORNING., Disp: 30 tablet, Rfl: 4 .  nitroGLYCERIN (NITROSTAT) 0.4 MG SL tablet, PLACE ONE (1) TABLET UNDER TONGUE  EVERY 5 MINUTES UP TO (3) DOSES AS NEEDED FOR CHEST PAIN., Disp: 25 tablet, Rfl: 0 .  ondansetron (ZOFRAN) 4 MG tablet, TAKE 1 TO 2 TABLETS AS NEEDED FOR NAUSEA., Disp: 20 tablet, Rfl: 0 .  ONETOUCH DELICA LANCETS 33G MISC, TEST BLOOD SUGAR ONCE DAILY., Disp: 100 each, Rfl: 3 .  oxybutynin (DITROPAN) 5 MG tablet, TAKE (1) TABLET TWICE DAILY., Disp: 60 tablet, Rfl: 5 .  ranitidine (ZANTAC) 150 MG tablet, TAKE 1 TABLET BY MOUTH AT BEDTIME., Disp: 30 tablet, Rfl: 2 .  rosuvastatin (CRESTOR) 10 MG tablet, TAKE (1) TABLET BY MOUTH ONCE DAILY., Disp: 30  tablet, Rfl: 5 .  sucralfate (CARAFATE) 1 GM/10ML suspension, Take 10 mLs (1 g total) by mouth 2 (two) times daily., Disp: 420 mL, Rfl: 2 .  tiZANidine (ZANAFLEX) 4 MG tablet, TAKE (1) TABLET EVERY SIX HOURS AS NEEDED FOR MUSCLE SPASMS., Disp: 30 tablet, Rfl: 5 .  tolterodine (DETROL) 2 MG tablet, TAKE (1) TABLET TWICE DAILY., Disp: 60 tablet, Rfl: 5 .  traZODone (DESYREL) 150 MG tablet, TAKE 1 TABLET BY MOUTH AT BEDTIME., Disp: 30 tablet, Rfl: 2 .  valACYclovir (VALTREX) 500 MG tablet, TAKE (1) TABLET TWICE DAILY., Disp: 60 tablet, Rfl: 1  Current Facility-Administered Medications:  .  triamcinolone acetonide (KENALOG-40) injection 40 mg, 40 mg, Other, Once, Johna Sheriff, MD Social History   Socioeconomic History  . Marital status: Single    Spouse name: Not on file  . Number of children: 6  . Years of education: Not on file  . Highest education level: 6th grade  Occupational History  . Occupation: Disability    Comment: 15 years  Social Needs  . Financial resource strain: Somewhat hard  . Food insecurity:    Worry: Sometimes true    Inability: Sometimes true  . Transportation needs:    Medical: No    Non-medical: No  Tobacco Use  . Smoking status: Never Smoker  . Smokeless tobacco: Never Used  Substance and Sexual Activity  . Alcohol use: No    Alcohol/week: 0.0 standard drinks  . Drug use: No  . Sexual activity: Yes    Lifestyle  . Physical activity:    Days per week: 0 days    Minutes per session: 0 min  . Stress: To some extent  Relationships  . Social connections:    Talks on phone: More than three times a week    Gets together: More than three times a week    Attends religious service: Never    Active member of club or organization: No    Attends meetings of clubs or organizations: Never    Relationship status: Patient refused  . Intimate partner violence:    Fear of current or ex partner: No    Emotionally abused: No    Physically abused: No    Forced sexual activity: No  Other Topics Concern  . Not on file  Social History Narrative   Patient lives with Daughter   Patient is on disability.    Patient has 8th grade education.    Patient has 6 children, 20 grand-chilren, and 3 great-grand children.    Family History  Problem Relation Age of Onset  . Heart attack Father   . Cancer Father   . Mental illness Father   . Cancer Mother   . Mental illness Mother   . Heart attack Brother        stents  . Alcohol abuse Brother   . Heart disease Brother   . Drug abuse Brother   . Diabetes Brother   . Colon cancer Maternal Aunt   . Cirrhosis Brother   . Stomach cancer Neg Hx     Objective: Office vital signs reviewed. BP (!) 152/97   Pulse (!) 103   Temp 97.8 F (36.6 C) (Oral)   Ht 5\' 2"  (1.575 m)   Wt 150 lb (68 kg)   SpO2 94%   BMI 27.44 kg/m   Physical Examination:  General: Awake, alert, nontoxic, No acute distress HEENT: Normal    Neck: No masses palpated. No lymphadenopathy    Ears:  Tympanic membranes intact, normal light reflex, no erythema, no bulging    Eyes: PERRLA, extraocular membranes intact, sclera white    Nose: nasal turbinates moist, clear nasal discharge; small hemostatic bleed noted in the left nare.    Throat: moist mucus membranes, no erythema. Airway is patent Cardio: regular rate and rhythm, S1S2 heard, no murmurs appreciated Pulm: Mild global  expiratory wheezes noted.  She has a cough during exam.  Normal work of breathing on room air.  No rhonchi or rails.  Dg Chest 2 View  Result Date: 02/09/2018 CLINICAL DATA:  Progressive cough EXAM: CHEST - 2 VIEW COMPARISON:  June 23, 2016 FINDINGS: There is scarring in the bases. There is no edema or consolidation. Heart size and pulmonary vascularity are normal. No adenopathy. There is a focal hiatal hernia. Loop type recorder present on the left anteriorly. There is degenerative change in the thoracic spine. There is postoperative change in portions of the visualized lumbar spine. IMPRESSION: Bibasilar scarring. No frank edema or consolidation. Stable cardiac silhouette. Focal hiatal hernia. Electronically Signed   By: Bretta Bang III M.D.   On: 02/09/2018 13:59    Assessment/ Plan: 62 y.o. female   1. Cough present for greater than 3 weeks Chest x-ray was obtained given persistent cough with scant hemoptysis.  Though, I do agree it likely was from the upper airway and not an organic bleed within the lungs.  Personal review of x-ray did not demonstrate any acute findings.  Formal review by radiology confirmed that there were no acute findings.  She has normal work of breathing on room air but oxygen is slightly on the lower end of normal.  Global expiratory wheezes noted on exam.  I suspect this is a COPD exacerbation and have prescribed her a Z-Pak, prednisone burst and recommended that she use her albuterol inhaler every 6 hours for the next 2 days.  Home care instructions were reviewed with the patient.  She is to follow-up with her PCP in 2 weeks for recheck.  If having persistent cough, low threshold to refer to pulmonology for further evaluation. - DG Chest 2 View; Future  2. COPD with acute exacerbation (HCC)   Orders Placed This Encounter  Procedures  . DG Chest 2 View    Standing Status:   Future    Number of Occurrences:   1    Standing Expiration Date:   04/12/2019     Order Specific Question:   Reason for Exam (SYMPTOM  OR DIAGNOSIS REQUIRED)    Answer:   2+ month hx cough, worsening over last 3 days    Order Specific Question:   Preferred imaging location?    Answer:   Internal    Order Specific Question:   Radiology Contrast Protocol - do NOT remove file path    Answer:   [email or web address]   Meds ordered this encounter  Medications  . predniSONE (DELTASONE) 20 MG tablet    Sig: Take 2 tablets (40 mg total) by mouth daily with breakfast for 5 days.    Dispense:  10 tablet    Refill:  0  . azithromycin (ZITHROMAX) 250 MG tablet    Sig: Take 2 tablets today, then take 1 tablet daily until gone.    Dispense:  6 tablet    Refill:  0     Hulen Skains, DO [facility] [phone]

## 2018-02-09 NOTE — Addendum Note (Signed)
Addended by: Raliegh Ip on: 02/09/2018 03:27 PM   Modules accepted: Orders

## 2018-02-11 ENCOUNTER — Telehealth: Payer: Self-pay | Admitting: Internal Medicine

## 2018-02-11 NOTE — Telephone Encounter (Signed)
Per pt b/p has been running high see readings in message Pt has appt with another cardiologist in [place] ,but this is not until April 14 2018 .Pt continues to take Florinef and consuming salt  Will forward to Dr Ladona Ridgel for review .[place]

## 2018-02-11 NOTE — Telephone Encounter (Signed)
New Message   203/87 209/83 208/99  Pt c/o BP issue:  1. What are your last 5 BP readings? 203/87, 209/83, 208/99 2. Are you having any other symptoms (ex. Dizziness, headache, blurred vision, passed out)?  3. What is your medication issue? Patient is calling because her BP has been going up and down very seldom its under 195. She is wondering can her dosage be increase for any of her medication for her BP.

## 2018-02-12 NOTE — Telephone Encounter (Signed)
Can she hold her florinef Saturday and Sunday, then update [name] on her bp's on Monday. Tricky to bring her bp down without over correcting and causing severe symptoms from low bp's   Dominga Ferry MD

## 2018-02-12 NOTE — Telephone Encounter (Signed)
Pt voiced understanding and will update [name] on Monday - says she is seeing [facility] clinic in [place] on 04/07/18

## 2018-02-18 ENCOUNTER — Institutional Professional Consult (permissible substitution): Payer: 59 | Admitting: Neurology

## 2018-02-26 ENCOUNTER — Other Ambulatory Visit: Payer: Self-pay | Admitting: Nurse Practitioner

## 2018-02-26 DIAGNOSIS — M47812 Spondylosis without myelopathy or radiculopathy, cervical region: Secondary | ICD-10-CM

## 2018-02-27 NOTE — Telephone Encounter (Signed)
Last seen 02/22/18 

## 2018-03-01 ENCOUNTER — Other Ambulatory Visit: Payer: Self-pay | Admitting: Nurse Practitioner

## 2018-03-01 ENCOUNTER — Other Ambulatory Visit: Payer: Self-pay | Admitting: Internal Medicine

## 2018-03-01 NOTE — Telephone Encounter (Signed)
Pharmacy is requesting refill. Is it okay to fill ?

## 2018-03-03 ENCOUNTER — Telehealth: Payer: Self-pay | Admitting: Cardiology

## 2018-03-03 NOTE — Telephone Encounter (Signed)
Need for Dr Wyline Mood or nurse to send [facility] a letter stating that she needs to be seen by Dr [facility]  Please call patient to clarify exactly what she is needing

## 2018-03-03 NOTE — Telephone Encounter (Signed)
Will forward last clinic note to [facility] that says pt needs to be seen by [facility] as per Dr Verna Czech referral

## 2018-03-16 ENCOUNTER — Encounter: Payer: Self-pay | Admitting: Nurse Practitioner

## 2018-03-16 ENCOUNTER — Ambulatory Visit (INDEPENDENT_AMBULATORY_CARE_PROVIDER_SITE_OTHER): Payer: 59 | Admitting: Nurse Practitioner

## 2018-03-16 VITALS — BP 172/95 | HR 74 | Temp 97.6°F | Ht 62.0 in | Wt 152.0 lb

## 2018-03-16 DIAGNOSIS — Z23 Encounter for immunization: Secondary | ICD-10-CM | POA: Diagnosis not present

## 2018-03-16 DIAGNOSIS — G44229 Chronic tension-type headache, not intractable: Secondary | ICD-10-CM

## 2018-03-16 DIAGNOSIS — M4802 Spinal stenosis, cervical region: Secondary | ICD-10-CM

## 2018-03-16 DIAGNOSIS — I10 Essential (primary) hypertension: Secondary | ICD-10-CM

## 2018-03-16 DIAGNOSIS — J309 Allergic rhinitis, unspecified: Secondary | ICD-10-CM | POA: Diagnosis not present

## 2018-03-16 DIAGNOSIS — M47817 Spondylosis without myelopathy or radiculopathy, lumbosacral region: Secondary | ICD-10-CM

## 2018-03-16 DIAGNOSIS — M9981 Other biomechanical lesions of cervical region: Secondary | ICD-10-CM

## 2018-03-16 DIAGNOSIS — B009 Herpesviral infection, unspecified: Secondary | ICD-10-CM

## 2018-03-16 DIAGNOSIS — K2100 Gastro-esophageal reflux disease with esophagitis, without bleeding: Secondary | ICD-10-CM

## 2018-03-16 DIAGNOSIS — I200 Unstable angina: Secondary | ICD-10-CM

## 2018-03-16 DIAGNOSIS — F3341 Major depressive disorder, recurrent, in partial remission: Secondary | ICD-10-CM

## 2018-03-16 DIAGNOSIS — M47812 Spondylosis without myelopathy or radiculopathy, cervical region: Secondary | ICD-10-CM

## 2018-03-16 DIAGNOSIS — F5101 Primary insomnia: Secondary | ICD-10-CM

## 2018-03-16 DIAGNOSIS — G4733 Obstructive sleep apnea (adult) (pediatric): Secondary | ICD-10-CM

## 2018-03-16 DIAGNOSIS — F411 Generalized anxiety disorder: Secondary | ICD-10-CM

## 2018-03-16 DIAGNOSIS — J4520 Mild intermittent asthma, uncomplicated: Secondary | ICD-10-CM

## 2018-03-16 DIAGNOSIS — R7303 Prediabetes: Secondary | ICD-10-CM

## 2018-03-16 DIAGNOSIS — F331 Major depressive disorder, recurrent, moderate: Secondary | ICD-10-CM

## 2018-03-16 DIAGNOSIS — E785 Hyperlipidemia, unspecified: Secondary | ICD-10-CM

## 2018-03-16 DIAGNOSIS — R569 Unspecified convulsions: Secondary | ICD-10-CM

## 2018-03-16 DIAGNOSIS — M791 Myalgia, unspecified site: Secondary | ICD-10-CM

## 2018-03-16 MED ORDER — DULOXETINE HCL 60 MG PO CPEP: 60.0000 mg | ORAL_CAPSULE | Freq: Every day | ORAL | 5 refills | Status: DC

## 2018-03-16 MED ORDER — ROSUVASTATIN CALCIUM 10 MG PO TABS: ORAL_TABLET | ORAL | 5 refills | Status: DC

## 2018-03-16 MED ORDER — BUTALBITAL-APAP-CAFFEINE 50-325-40 MG PO TABS: ORAL_TABLET | ORAL | 1 refills | Status: DC

## 2018-03-16 MED ORDER — BUSPIRONE HCL 10 MG PO TABS: ORAL_TABLET | ORAL | 5 refills | Status: DC

## 2018-03-16 MED ORDER — RANITIDINE HCL 150 MG PO TABS: 150.0000 mg | ORAL_TABLET | Freq: Every day | ORAL | 2 refills | Status: DC

## 2018-03-16 MED ORDER — FLUTICASONE FUROATE-VILANTEROL 200-25 MCG/INH IN AEPB: 1.0000 | INHALATION_SPRAY | Freq: Every day | RESPIRATORY_TRACT | 5 refills | Status: DC

## 2018-03-16 MED ORDER — ISOSORBIDE MONONITRATE ER 30 MG PO TB24: 30.0000 mg | ORAL_TABLET | Freq: Every day | ORAL | 6 refills | Status: DC

## 2018-03-16 MED ORDER — ESCITALOPRAM OXALATE 20 MG PO TABS: ORAL_TABLET | ORAL | 5 refills | Status: DC

## 2018-03-16 MED ORDER — DIAZEPAM 5 MG PO TABS: ORAL_TABLET | ORAL | 2 refills | Status: DC

## 2018-03-16 MED ORDER — HYDROCODONE-ACETAMINOPHEN 7.5-325 MG PO TABS: 1.0000 | ORAL_TABLET | Freq: Two times a day (BID) | ORAL | 0 refills | Status: DC

## 2018-03-16 MED ORDER — DEXLANSOPRAZOLE 60 MG PO CPDR: 1.0000 | DELAYED_RELEASE_CAPSULE | Freq: Every day | ORAL | 2 refills | Status: DC

## 2018-03-16 MED ORDER — VALACYCLOVIR HCL 500 MG PO TABS: ORAL_TABLET | ORAL | 1 refills | Status: DC

## 2018-03-16 MED ORDER — CARVEDILOL 3.125 MG PO TABS: 3.1250 mg | ORAL_TABLET | Freq: Four times a day (QID) | ORAL | 4 refills | Status: DC | PRN

## 2018-03-16 MED ORDER — TRAZODONE HCL 150 MG PO TABS: ORAL_TABLET | ORAL | 2 refills | Status: DC

## 2018-03-16 MED ORDER — LEVETIRACETAM 500 MG PO TABS: ORAL_TABLET | ORAL | 5 refills | Status: DC

## 2018-03-16 NOTE — Progress Notes (Signed)
Subjective:    Patient ID: [name], female    DOB: 06-22-1955, 62 y.o.   MRN: [id number]   Chief Complaint: Medical Management of Chronic Issues   HPI:  1. Essential hypertension  Has occasional chest pain, sob and headaches. But is not associated with her blood pressure.. she is seeing cardiology because some days her blood pressure will be really high hen 30 minutes later it is very low. She is going to see cardiology in [place].  2. Obstructive sleep apnea syndrome  Does not have CPAP machine right  Now has appointment in January to get a new one.  3. Mild intermittent asthma without complication  Has frequent sob  4. Allergic rhinitis, unspecified seasonality, unspecified trigger  Takes otc allergy meds  5. Gastroesophageal reflux disease with esophagitis  Use zantac as needed  6. Neural foraminal stenosis of cervical spine  Has chronic neck pain. Rates pain 1/10 now but back is 8/10 she is on norco 7.5/325 BID. Cannot function without meds. She takes valium as a muscle relaxor. Sh ewant sto increase pain meds.  7. Seizures (HCC)  Her last seizure was last month but has blacked out 3 times- if she misses 1 doe of keppra she wll have a seizure.  8. Recurrent major depressive disorder, in partial remission (HCC)  She is on lexapro daily and that helps. She has her own place to live now. Living with her daughter increased her depression.  9. Pre-diabetes her blood sugars average around 16 but can be as high as 200  10. Myalgia  Has achiness all the time.  11. Primary insomnia  She is on trazadone to sleep and that does not always help.  12. Hyperlipidemia with target LDL less than 100  Doe snot watch diet at all. No exercise  13. GAD (generalized anxiety disorder)  Is on buspar and that helps    Outpatient Encounter Medications as of 03/16/2018  Medication Sig  . acyclovir ointment (ZOVIRAX) 5 % APPLY TO AFFECTED AREA EVERY 3 HOURS  . albuterol (PROVENTIL)  (2.5 MG/3ML) 0.083% nebulizer solution USE 1 VIAL IN NEBULIZER EVERY 4 HOURS AS NEEDED FOR WHEEZING.  [name] albuterol (VENTOLIN HFA) 108 (90 Base) MCG/ACT inhaler INHALE 2 PUFFS EVERY 6 HOURS AS NEEDED FOR SHORTNESS OF BREATH AND WHEEZING.  [name] Alcohol Swabs (ALCOHOL PADS) 70 % PADS USE 1 PAD DAILY WHEN CHECKING BLOOD SUGAR.  [name] ALLERGY 10 MG tablet TAKE 1 TABLET ONCE DAILY.  . busPIRone (BUSPAR) 10 MG tablet TAKE (1) TABLET TWICE DAILY.  . butalbital-acetaminophen-caffeine (FIORICET, ESGIC) 50-325-40 MG tablet TAKE 1 TABLET TWICE DAILY AS NEEDED FOR HEADACHE.  . butalbital-acetaminophen-caffeine (FIORICET, ESGIC) 50-325-40 MG tablet TAKE 1 TABLET TWICE DAILY AS NEEDED FOR HEADACHE.  . carvedilol (COREG) 3.125 MG tablet IF SYSTOLIC<130 DO NOT TAKE, IF SYSTOLIC 130-150 TAKE 1 TABLET 2 TIMES A DAY. IF SYSTOLIC >150 TAKE 2 TABLETS 2 TIMES A DAY.  [name] DEXILANT 60 MG capsule TAKE 1 CAPSULE DAILY.  . diazepam (VALIUM) 5 MG tablet TAKE 1 TABLET EVERY 12 HOURS AS NEEDED FOR ANXIETY.  . DULoxetine (CYMBALTA) 60 MG capsule Take 1 capsule (60 mg total) by mouth at bedtime.  [name] escitalopram (LEXAPRO) 20 MG tablet TAKE (1) TABLET BY MOUTH ONCE DAILY.  [name] estradiol (ESTRACE) 0.1 MG/GM vaginal cream PLACE 1 APPLICATORFUL VAGINALLY AT BEDTIME.  . fludrocortisone (FLORINEF) 0.1 MG tablet Take 3 tablets (0.3 mg total) by mouth daily.  . fluticasone (FLONASE) 50 MCG/ACT nasal spray Place  2 sprays into both nostrils daily.  . fluticasone furoate-vilanterol (BREO ELLIPTA) 200-25 MCG/INH AEPB Inhale 1 puff into the lungs daily.  . furosemide (LASIX) 40 MG tablet TAKE 1 TABLET AS NEEDED FOR SWELLING  . glucose blood (ONETOUCH VERIO) test strip Test blood sugar daily  . HYDROcodone-acetaminophen (NORCO) 7.5-325 MG tablet Take 1 tablet by mouth 2 (two) times daily.  [name] ipratropium (ATROVENT) 0.02 % nebulizer solution USE 1 VIAL IN NEBULIZER EVERY 4 HOURS AS NEEDED FOR WHEEZING.  . isosorbide mononitrate (IMDUR) 30 MG 24 hr tablet  Take 1 tablet (30 mg total) by mouth daily.  [name] lamoTRIgine (LAMICTAL) 150 MG tablet TAKE 1 TABLET BY MOUTH ONCE DAILY.  [name] levETIRAcetam (KEPPRA) 500 MG tablet TAKE (1) TABLET TWICE DAILY.  . nitroGLYCERIN (NITROSTAT) 0.4 MG SL tablet PLACE ONE (1) TABLET UNDER TONGUE EVERY 5 MINUTES UP TO (3) DOSES AS NEEDED FOR CHEST PAIN.  [name] ondansetron (ZOFRAN) 4 MG tablet TAKE 1 TO 2 TABLETS AS NEEDED FOR NAUSEA.  [name] MISC TEST BLOOD SUGAR ONCE DAILY.  [name] oxybutynin (DITROPAN) 5 MG tablet TAKE (1) TABLET TWICE DAILY.  . ranitidine (ZANTAC) 150 MG tablet TAKE 1 TABLET BY MOUTH AT BEDTIME.  . rosuvastatin (CRESTOR) 10 MG tablet TAKE (1) TABLET BY MOUTH ONCE DAILY.  [name] sucralfate (CARAFATE) 1 GM/10ML suspension Take 10 mLs (1 g total) by mouth 2 (two) times daily.  [name] tiZANidine (ZANAFLEX) 4 MG tablet TAKE (1) TABLET EVERY SIX HOURS AS NEEDED FOR MUSCLE SPASMS.  [name] tolterodine (DETROL) 2 MG tablet TAKE (1) TABLET TWICE DAILY.  . traZODone (DESYREL) 150 MG tablet TAKE 1 TABLET BY MOUTH AT BEDTIME.  . valACYclovir (VALTREX) 500 MG tablet TAKE (1) TABLET TWICE DAILY.  [name] HYDROcodone-acetaminophen (NORCO) 7.5-325 MG tablet Take 1 tablet by mouth 2 (two) times daily.  [name] HYDROcodone-acetaminophen (NORCO) 7.5-325 MG tablet Take 1 tablet by mouth 2 (two) times daily.     New complaints: None  today  Social history: Lives alone now. Falls dfrequently   Review of Systems  Constitutional: Negative for activity change and appetite change.  HENT: Negative.   Eyes: Negative for pain.  Respiratory: Negative for shortness of breath.   Cardiovascular: Negative for chest pain, palpitations and leg swelling.  Gastrointestinal: Negative for abdominal pain.  Endocrine: Negative for polydipsia.  Genitourinary: Negative.   Skin: Negative for rash.  Neurological: Negative for dizziness, weakness and headaches.  Hematological: Does not bruise/bleed easily.  Psychiatric/Behavioral: Negative.   All  other systems reviewed and are negative.      Objective:   Physical Exam Vitals signs and nursing note reviewed.  Constitutional:      Appearance: She is obese. She is not ill-appearing.  HENT:     Head: Normocephalic.     Nose: Nose normal.     Mouth/Throat:     Mouth: Mucous membranes are moist.  Eyes:     Pupils: Pupils are equal, round, and reactive to light.  Neck:     Musculoskeletal: Normal range of motion. Neck rigidity (due to pain on with rotation) present.  Cardiovascular:     Rate and Rhythm: Normal rate and regular rhythm.     Pulses: Normal pulses.     Heart sounds: Normal heart sounds.  Pulmonary:     Effort: Pulmonary effort is normal.     Breath sounds: Normal breath sounds.  Abdominal:     General: Abdomen is flat. Bowel sounds are normal.  Musculoskeletal: Normal range of  motion.     Comments: Walking with cane Decrease ROM of lumbar spine with pain on flexion and extension.  Skin:    General: Skin is warm.  Neurological:     General: No focal deficit present.     Mental Status: She is alert and oriented to person, place, and time.  Psychiatric:        Mood and Affect: Mood normal.        Behavior: Behavior normal.    BP (!) 172/95   Pulse 74   Temp 97.6 F (36.4 C) (Oral)   Ht 5\' 2"  (1.575 m)   Wt 152 lb (68.9 kg)   BMI 27.80 kg/m         Assessment & Plan:  JADIAH BARNABA comes in today with chief complaint of Medical Management of Chronic Issues   Diagnosis and orders addressed:  1. Essential hypertension Low sodium diet  2. Obstructive sleep apnea syndrome Keep appointmnet to get cpap machine  3. Mild intermittent asthma without complication  4. Allergic rhinitis, unspecified seasonality, unspecified trigger - fluticasone furoate-vilanterol (BREO ELLIPTA) 200-25 MCG/INH AEPB; Inhale 1 puff into the lungs daily.  Dispense: 60 each; Refill: 5  5. Gastroesophageal reflux disease with esophagitis Avoid spicy foods Do not  eat 2 hours prior to bedtime - ranitidine (ZANTAC) 150 MG tablet; Take 1 tablet (150 mg total) by mouth at bedtime.  Dispense: 30 tablet; Refill: 2 - dexlansoprazole (DEXILANT) 60 MG capsule; Take 1 capsule (60 mg total) by mouth daily.  Dispense: 30 capsule; Refill: 2  6. Neural foraminal stenosis of cervical spine Keep follow up with neurosurgeon  7. Seizures (HCC) Keep follow up with neurology - levETIRAcetam (KEPPRA) 500 MG tablet; TAKE (1) TABLET TWICE DAILY.  Dispense: 60 tablet; Refill: 5  8. Recurrent major depressive disorder, in partial remission (HCC)  9. Pre-diabetes Watch carbs in diet  10. Myalgia   11. Primary insomnia bedime routine - traZODone (DESYREL) 150 MG tablet; TAKE 1 TABLET BY MOUTH AT BEDTIME.  Dispense: 30 tablet; Refill: 2  12. Hyperlipidemia with target LDL less than 100 Low fat diet - rosuvastatin (CRESTOR) 10 MG tablet; TAKE (1) TABLET BY MOUTH ONCE DAILY.  Dispense: 30 tablet; Refill: 5  13. GAD (generalized anxiety disorder) Stress management  14. Anxiety state - busPIRone (BUSPAR) 10 MG tablet; 1 po bid prn  Dispense: 60 tablet; Refill: 5  15. Cervical spondylosis without myelopathy - diazepam (VALIUM) 5 MG tablet; 1 po bid  Dispense: 60 tablet; Refill: 2  16. Chronic tension-type headache, not intractable - butalbital-acetaminophen-caffeine (FIORICET, ESGIC) 50-325-40 MG tablet; TAKE 1 TABLET TWICE DAILY AS NEEDED FOR HEADACHE.  Dispense: 20 tablet; Refill: 1  17. Herpes simplex virus (HSV) infection - valACYclovir (VALTREX) 500 MG tablet; 1 po bid  Dispense: 60 tablet; Refill: 1  18. Lumbosacral spondylosis without myelopathy Patient wanted pain meds increased but I said NO - HYDROcodone-acetaminophen (NORCO) 7.5-325 MG tablet; Take 1 tablet by mouth 2 (two) times daily.  Dispense: 60 tablet; Refill: 0 - HYDROcodone-acetaminophen (NORCO) 7.5-325 MG tablet; Take 1 tablet by mouth 2 (two) times daily.  Dispense: 60 tablet; Refill: 0 -  HYDROcodone-acetaminophen (NORCO) 7.5-325 MG tablet; Take 1 tablet by mouth 2 (two) times daily.  Dispense: 60 tablet; Refill: 0  19. Moderate episode of recurrent major depressive disorder (HCC) - DULoxetine (CYMBALTA) 60 MG capsule; Take 1 capsule (60 mg total) by mouth at bedtime.  Dispense: 30 capsule; Refill: 5 - escitalopram (LEXAPRO) 20 MG tablet;  TAKE (1) TABLET BY MOUTH ONCE DAILY.  Dispense: 30 tablet; Refill: 5  20. Unstable angina (HCC)  - isosorbide mononitrate (IMDUR) 30 MG 24 hr tablet; Take 1 tablet (30 mg total) by mouth daily.  Dispense: 30 tablet; Refill: 6   Labs pending Health Maintenance reviewed Diet and exercise encouraged  Follow up plan: 15months   [name] Daphine Deutscher, FNP

## 2018-03-16 NOTE — Patient Instructions (Signed)

## 2018-03-16 NOTE — Addendum Note (Signed)
Addended by: Cleda Daub on: 03/16/2018 04:19 PM   Modules accepted: Orders

## 2018-03-17 ENCOUNTER — Telehealth (INDEPENDENT_AMBULATORY_CARE_PROVIDER_SITE_OTHER): Payer: Self-pay | Admitting: Physical Medicine and Rehabilitation

## 2018-03-17 LAB — CMP14+EGFR
ALT: 9 IU/L (ref 0–32)
AST: 15 IU/L (ref 0–40)
Albumin/Globulin Ratio: 1.9 (ref 1.2–2.2)
Albumin: 4.3 g/dL (ref 3.6–4.8)
Alkaline Phosphatase: 112 IU/L (ref 39–117)
BUN/Creatinine Ratio: 7 — ABNORMAL LOW (ref 12–28)
BUN: 7 mg/dL — ABNORMAL LOW (ref 8–27)
Bilirubin Total: <0.2 mg/dL (ref 0.0–1.2)
CO2: 26 mmol/L (ref 20–29)
Calcium: 9.2 mg/dL (ref 8.7–10.3)
Chloride: 103 mmol/L (ref 96–106)
Creatinine, Ser: 0.96 mg/dL (ref 0.57–1.00)
GFR calc Af Amer: 74 mL/min/{1.73_m2} (ref >59)
GFR calc non Af Amer: 64 mL/min/{1.73_m2} (ref >59)
Globulin, Total: 2.3 g/dL (ref 1.5–4.5)
Glucose: 86 mg/dL (ref 65–99)
Potassium: 3.6 mmol/L (ref 3.5–5.2)
Sodium: 143 mmol/L (ref 134–144)
Total Protein: 6.6 g/dL (ref 6.0–8.5)

## 2018-03-17 LAB — LIPID PANEL
Chol/HDL Ratio: 2.9 ratio (ref 0.0–4.4)
Cholesterol, Total: 161 mg/dL (ref 100–199)
HDL: 55 mg/dL (ref >39)
LDL Calculated: 74 mg/dL (ref 0–99)
Triglycerides: 162 mg/dL — ABNORMAL HIGH (ref 0–149)
VLDL Cholesterol Cal: 32 mg/dL (ref 5–40)

## 2018-03-18 NOTE — Telephone Encounter (Signed)
yes

## 2018-03-18 NOTE — Telephone Encounter (Signed)
Scheduled for 04/07/18.

## 2018-03-25 ENCOUNTER — Other Ambulatory Visit: Payer: Self-pay | Admitting: Family Medicine

## 2018-03-25 ENCOUNTER — Other Ambulatory Visit: Payer: Self-pay | Admitting: Cardiology

## 2018-03-25 ENCOUNTER — Other Ambulatory Visit: Payer: Self-pay | Admitting: Nurse Practitioner

## 2018-03-25 DIAGNOSIS — F5101 Primary insomnia: Secondary | ICD-10-CM

## 2018-03-25 DIAGNOSIS — K2100 Gastro-esophageal reflux disease with esophagitis, without bleeding: Secondary | ICD-10-CM

## 2018-03-25 DIAGNOSIS — B009 Herpesviral infection, unspecified: Secondary | ICD-10-CM

## 2018-03-26 ENCOUNTER — Other Ambulatory Visit: Payer: Self-pay | Admitting: Internal Medicine

## 2018-03-26 NOTE — Telephone Encounter (Signed)
Last seen 03/16/18

## 2018-04-01 NOTE — Telephone Encounter (Signed)
patient calling asking if we can send a note about [employer] and transportation. Not clear what is is exactly needing since we sent note already

## 2018-04-06 ENCOUNTER — Encounter: Payer: Self-pay | Admitting: Neurology

## 2018-04-06 ENCOUNTER — Ambulatory Visit (INDEPENDENT_AMBULATORY_CARE_PROVIDER_SITE_OTHER): Payer: 59 | Admitting: Neurology

## 2018-04-06 VITALS — BP 132/86 | HR 67 | Ht 62.0 in | Wt 153.0 lb

## 2018-04-06 DIAGNOSIS — R55 Syncope and collapse: Secondary | ICD-10-CM | POA: Diagnosis not present

## 2018-04-06 MED ORDER — ALPRAZOLAM 0.5 MG PO TABS: ORAL_TABLET | ORAL | 0 refills | Status: DC

## 2018-04-06 NOTE — Progress Notes (Signed)
Reason for visit: Syncope, seizures  Referring physician: Dr. Antionette PolesMartin  [name] is a 63 y.o. female  History of present illness:  Ms. [name] is a 63 year old right-handed white female who was seen through this office in 2014 for episodes of syncope and possible seizures.  The patient apparently in the past has had ambulatory EEG studies that have shown nonepileptic events.  The patient comes in today describing 2 separate type of episodes.  The patient apparently has been followed through cardiology, she has a loop recorder in place that has been there since 2013.  The patient has 4 or 5 episodes of syncope associated with documented low blood pressure.  The patient will state that she has systolic pressures that go into the 50s and 60s and 70s and then she may blackout.  The patient may be unconscious for about 10 minutes.  The episodes are usually preceded by some slurring of speech.  The patient indicates that she has had these episodes since her 2740s.  The patient has been on disability for these episodes for the last 15 years.  The patient also describes events of seizures that are separate from the blackout events.  These episodes are associated with jerking of the arms and legs unassociated with loss of consciousness.  The patient tends not lose control of the bowels or the bladder with these events.  She does report some frequent headaches that she relates to spikes in blood pressure.  She is not clear that the jerking events are associated with headache.  The patient claims that as long she takes her Keppra she does not have these jerking episodes.  She does not operate a motor vehicle.  She reports no focal numbness or weakness of the arms or legs or face.  She is very claustrophobic, she has never had MRI of the brain.  In the past, she apparently was evaluated for sleep apnea, the sleep study done in 2014 did not show significant apnea issues.  Past Medical History:  Diagnosis  Date  . Anginal pain (HCC)    last time   . Anxiety   . Arthritis    RHEUMATOID  . Asthma   . Bipolar 1 disorder (HCC)   . Cataracts, bilateral 07/2017  . COPD (chronic obstructive pulmonary disease) (HCC)   . Coronary artery disease    reported hx of "MI";  Echo 2009 with normal LVF;  Myoview 05/2011: no ischemia  . Depression   . Dyslipidemia   . Dysrhythmia    SVT  . Esophageal stricture   . Fibromyalgia   . GERD (gastroesophageal reflux disease)   . H/O hiatal hernia   . Head injury, unspecified   . Herpes simplex infection   . History of kidney stones   . Hyperlipidemia   . Hypertension   . Insomnia   . Myocardial infarction [facility](HCC)    age 89  . Osteoporosis   . Pneumonia    hx  . Seizures (HCC)    last 3 weeks ago- prescribed keppra -never took didnt like side effects read about online  . Shortness of breath   . Sleep apnea    ? neg  . Spinal stenosis of lumbar region   . Spondylolisthesis   . Status post placement of implantable loop recorder   . Supraventricular tachycardia (HCC)   . Syncope and collapse    s/p ILR; no arhythmogenic cause identified  . UTI (lower urinary tract infection)  Past Surgical History:  Procedure Laterality Date  . BACK SURGERY    . BREAST SURGERY     lumpectomy  . CATARACT EXTRACTION W/PHACO Right 07/31/2017   Procedure: CATARACT EXTRACTION PHACO AND INTRAOCULAR LENS PLACEMENT (IOC);  Surgeon: Fabio Pierce, MD;  Location: [facility];  Service: Ophthalmology;  Laterality: Right;  CDE: 2.33  . CATARACT EXTRACTION W/PHACO Left 08/14/2017   Procedure: CATARACT EXTRACTION PHACO AND INTRAOCULAR LENS PLACEMENT (IOC);  Surgeon: Fabio Pierce, MD;  Location: [facility];  Service: Ophthalmology;  Laterality: Left;  CDE: 2.74  . CHOLECYSTECTOMY    . CYSTOSCOPY     stone  . DOPPLER ECHOCARDIOGRAPHY  2009  . head up tilt table testing  06/15/2007   [name]  . HEMORRHOID SURGERY    . insertion of implatable loop recorder  08/11/2007    [name]  . POSTERIOR CERVICAL FUSION/FORAMINOTOMY N/A 12/19/2013   Procedure: RIGHT C3-4.C4-5 AND C5-6 FORAMINOTOMIES;  Surgeon: Kerrin Champagne, MD;  Location: [facility] OR;  Service: Orthopedics;  Laterality: N/A;  . TUBAL LIGATION      Family History  Problem Relation Age of Onset  . Heart attack Father   . Cancer Father   . Mental illness Father   . Cancer Mother   . Mental illness Mother   . Heart attack Brother        stents  . Alcohol abuse Brother   . Heart disease Brother   . Drug abuse Brother   . Diabetes Brother   . Colon cancer Maternal Aunt   . Cirrhosis Brother   . Stomach cancer Neg Hx     Social history:  reports that she has never smoked. She has never used smokeless tobacco. She reports that she does not drink alcohol or use drugs.  Medications:  Prior to Admission medications   Medication Sig Start Date End Date Taking? Authorizing Provider  acyclovir ointment (ZOVIRAX) 5 % APPLY TO AFFECTED AREA EVERY 3 HOURS 03/26/18  Yes Daphine Deutscher, [name], FNP  albuterol (PROVENTIL) (2.5 MG/3ML) 0.083% nebulizer solution USE 1 VIAL IN NEBULIZER EVERY 4 HOURS AS NEEDED FOR WHEEZING. 04/28/17  Yes Daphine Deutscher, [name], FNP  albuterol (VENTOLIN HFA) 108 (90 Base) MCG/ACT inhaler INHALE 2 PUFFS EVERY 6 HOURS AS NEEDED FOR SHORTNESS OF BREATH AND WHEEZING. 05/25/17  Yes Daphine Deutscher, [name], FNP  Alcohol Swabs (ALCOHOL PADS) 70 % PADS USE 1 PAD DAILY WHEN CHECKING BLOOD SUGAR. 11/12/17  Yes Daphine Deutscher, [name], FNP  ALLERGY 10 MG tablet TAKE 1 TABLET ONCE DAILY. 12/03/17  Yes Daphine Deutscher, [name], FNP  busPIRone (BUSPAR) 10 MG tablet 1 po bid prn 03/16/18  Yes Martin, [name], FNP  butalbital-acetaminophen-caffeine (FIORICET, ESGIC) 50-325-40 MG tablet TAKE 1 TABLET TWICE DAILY AS NEEDED FOR HEADACHE. 03/16/18  Yes Daphine Deutscher, [name], FNP  carvedilol (COREG) 3.125 MG tablet Take 1 tablet (3.125 mg total) by mouth 4 (four) times daily as needed. 03/16/18  Yes Martin,  [name], FNP  DEXILANT 60 MG capsule TAKE 1 CAPSULE DAILY. 03/26/18  Yes Daphine Deutscher, [name], FNP  diazepam (VALIUM) 5 MG tablet 1 po bid 03/16/18  Yes Daphine Deutscher, [name], FNP  DULoxetine (CYMBALTA) 60 MG capsule Take 1 capsule (60 mg total) by mouth at bedtime. 03/16/18  Yes Martin, [name], FNP  escitalopram (LEXAPRO) 20 MG tablet TAKE (1) TABLET BY MOUTH ONCE DAILY. 03/16/18  Yes Daphine Deutscher, [name], FNP  estradiol (ESTRACE) 0.1 MG/GM vaginal cream PLACE 1 APPLICATORFUL VAGINALLY AT BEDTIME. 01/27/18  Yes Daphine Deutscher, [name], FNP  fludrocortisone (FLORINEF) 0.1 MG tablet TAKE 2 TABLETS  BY MOUTH DAILY. 03/25/18  Yes BranchDorothe Pea, MD  fluticasone (FLONASE) 50 MCG/ACT nasal spray Place 2 sprays into both nostrils daily. 04/21/17  Yes Martin, [name], FNP  fluticasone furoate-vilanterol (BREO ELLIPTA) 200-25 MCG/INH AEPB Inhale 1 puff into the lungs daily. 03/16/18  Yes Daphine Deutscher, [name], FNP  furosemide (LASIX) 40 MG tablet TAKE 1 TABLET AS NEEDED FOR SWELLING 09/30/17  Yes Branch, Dorothe Pea, MD  glucose blood (ONETOUCH VERIO) test strip Test blood sugar daily 06/16/17  Yes Daphine Deutscher, [name], FNP  HYDROcodone-acetaminophen (NORCO) 7.5-325 MG tablet Take 1 tablet by mouth 2 (two) times daily. 03/16/18 04/15/18 Yes Martin, [name], FNP  ipratropium (ATROVENT) 0.02 % nebulizer solution USE 1 VIAL IN NEBULIZER EVERY 4 HOURS AS NEEDED FOR WHEEZING. 05/26/17  Yes Daphine Deutscher, [name], FNP  isosorbide mononitrate (IMDUR) 30 MG 24 hr tablet Take 1 tablet (30 mg total) by mouth daily. 03/16/18  Yes Martin, [name], FNP  lamoTRIgine (LAMICTAL) 150 MG tablet TAKE 1 TABLET BY MOUTH ONCE DAILY. 02/27/18  Yes Martin, [name], FNP  levETIRAcetam (KEPPRA) 500 MG tablet TAKE (1) TABLET TWICE DAILY. 03/16/18  Yes Martin, [name], FNP  nitroGLYCERIN (NITROSTAT) 0.4 MG SL tablet PLACE ONE (1) TABLET UNDER TONGUE EVERY 5 MINUTES UP TO (3) DOSES AS NEEDED FOR  CHEST PAIN. 02/27/18  Yes Daphine Deutscher, [name], FNP  ondansetron (ZOFRAN) 4 MG tablet TAKE 1 TO 2 TABLETS AS NEEDED FOR NAUSEA. 03/29/18  Yes Marinus Maw, MD  [facility] DELICA LANCETS 33G MISC TEST BLOOD SUGAR ONCE DAILY. 06/16/17  Yes Daphine Deutscher, [name], FNP  oxybutynin (DITROPAN) 5 MG tablet TAKE (1) TABLET TWICE DAILY. 12/04/17  Yes Daphine Deutscher, [name], FNP  ranitidine (ZANTAC) 150 MG tablet TAKE 1 TABLET BY MOUTH AT BEDTIME. 03/26/18  Yes Martin, [name], FNP  rosuvastatin (CRESTOR) 10 MG tablet TAKE (1) TABLET BY MOUTH ONCE DAILY. 03/16/18  Yes Martin, [name], FNP  sucralfate (CARAFATE) 1 GM/10ML suspension Take 10 mLs (1 g total) by mouth 2 (two) times daily. 01/28/18  Yes Danis, Andreas Blower, MD  tiZANidine (ZANAFLEX) 4 MG tablet TAKE (1) TABLET EVERY SIX HOURS AS NEEDED FOR MUSCLE SPASMS. 02/27/18  Yes Daphine Deutscher, [name], FNP  tolterodine (DETROL) 2 MG tablet TAKE (1) TABLET TWICE DAILY. 06/12/17  Yes Daphine Deutscher, [name], FNP  traZODone (DESYREL) 150 MG tablet TAKE 1 TABLET BY MOUTH AT BEDTIME. 03/26/18  Yes Daphine Deutscher, [name], FNP  valACYclovir (VALTREX) 500 MG tablet TAKE (1) TABLET TWICE DAILY. 03/26/18  Yes Daphine Deutscher [name], FNP      Allergies  Allergen Reactions  . Codeine Other (See Comments)    "I will have a heart attack."  . Morphine And Related Other (See Comments)    "It will cause me to have a heart attack."  . Ambien [Zolpidem Tartrate] Nausea And Vomiting  . Lyrica [Pregabalin] Swelling and Other (See Comments)    Weight gain  . Neurontin [Gabapentin] Other (See Comments)    Causes elevated LFTs     ROS:  Out of a complete 14 system review of symptoms, the patient complains only of the following symptoms, and all other reviewed systems are negative.  Chest pain, palpitations of the heart Shortness of breath, cough, wheezing, snoring Anemia, easy bruising, easy bleeding Increased thirst Joint pain, joint swelling, aching  muscles Memory loss, headache, slurred speech, seizures, passing out, tremor Depression, anxiety, decreased energy Insomnia  Blood pressure 132/86, pulse 67, height 5\' 2"  (1.575 m), weight 153 lb (69.4 kg), SpO2 92 %.  Physical Exam  General: The patient is alert  and cooperative at the time of the examination.  Eyes: Pupils are equal, round, and reactive to light. Discs are flat bilaterally.  Neck: The neck is supple, no carotid bruits are noted.  Respiratory: The respiratory examination is clear.  Cardiovascular: The cardiovascular examination reveals a regular rate and rhythm, no obvious murmurs or rubs are noted.  Skin: Extremities are without significant edema.  Neurologic Exam  Mental status: The patient is alert and oriented x 3 at the time of the examination. The patient has apparent normal recent and remote memory, with an apparently normal attention span and concentration ability.  Cranial nerves: Facial symmetry is present. There is good sensation of the face to pinprick and soft touch on the left face, decreased on the right.  The patient splits the midline with vibration sensation on the forehead, decreased on the right. The strength of the facial muscles and the muscles to head turning and shoulder shrug are normal bilaterally. Speech is well enunciated, no aphasia or dysarthria is noted. Extraocular movements are full. Visual fields are full. The tongue is midline, and the patient has symmetric elevation of the soft palate. No obvious hearing deficits are noted.  Motor: The motor testing reveals 5 over 5 strength of all 4 extremities. Good symmetric motor tone is noted throughout.  Sensory: Sensory testing is intact to pinprick, soft touch, vibration sensation, and position sense on all 4 extremities, with exception is of decreased pinprick sensation and vibration sensation on the left foot as compared to the right. No evidence of extinction is noted.  Coordination:  Cerebellar testing reveals good finger-nose-finger and heel-to-shin bilaterally.  Gait and station: Gait is normal. Tandem gait is normal. Romberg is negative. No drift is seen.  Reflexes: Deep tendon reflexes are symmetric and normal bilaterally. Toes are downgoing bilaterally.   Assessment/Plan:  1.  History of syncope, recurrent  2.  Reports of "seizures"  3.  Nonorganic sensory examination  The patient reports episodes of generalized jerking unassociated with loss of consciousness that do not appear to be consistent with the diagnosis of seizures.  The patient claims that Keppra has eliminated these events, however.  The patient continues to have episodes of syncope that she claims are associated with low blood pressure.  The clinical examination suggests non-organic features.  The patient will be set up for MRI of the brain, and she will have an EEG study.  She will follow-up in 4 or 5 months.  Marlan Palau MD 04/06/2018 2:32 PM  [facility] [address] [place], [place] [address]  Phone [phone] Fax [phone]

## 2018-04-07 ENCOUNTER — Encounter: Payer: Self-pay | Admitting: Family Medicine

## 2018-04-07 ENCOUNTER — Telehealth: Payer: Self-pay | Admitting: Neurology

## 2018-04-07 ENCOUNTER — Ambulatory Visit: Payer: 59 | Admitting: Cardiology

## 2018-04-07 ENCOUNTER — Ambulatory Visit (INDEPENDENT_AMBULATORY_CARE_PROVIDER_SITE_OTHER): Payer: Self-pay

## 2018-04-07 ENCOUNTER — Ambulatory Visit (INDEPENDENT_AMBULATORY_CARE_PROVIDER_SITE_OTHER): Payer: 59 | Admitting: Family Medicine

## 2018-04-07 ENCOUNTER — Ambulatory Visit (INDEPENDENT_AMBULATORY_CARE_PROVIDER_SITE_OTHER): Payer: 59 | Admitting: Physical Medicine and Rehabilitation

## 2018-04-07 ENCOUNTER — Telehealth: Payer: Self-pay | Admitting: Nurse Practitioner

## 2018-04-07 ENCOUNTER — Encounter (INDEPENDENT_AMBULATORY_CARE_PROVIDER_SITE_OTHER): Payer: Self-pay | Admitting: Physical Medicine and Rehabilitation

## 2018-04-07 VITALS — BP 178/96 | HR 78 | Temp 97.2°F | Ht 62.0 in | Wt 153.4 lb

## 2018-04-07 DIAGNOSIS — M7061 Trochanteric bursitis, right hip: Secondary | ICD-10-CM | POA: Diagnosis not present

## 2018-04-07 DIAGNOSIS — R519 Headache, unspecified: Secondary | ICD-10-CM

## 2018-04-07 DIAGNOSIS — R51 Headache: Secondary | ICD-10-CM

## 2018-04-07 DIAGNOSIS — M7062 Trochanteric bursitis, left hip: Secondary | ICD-10-CM | POA: Diagnosis not present

## 2018-04-07 DIAGNOSIS — J441 Chronic obstructive pulmonary disease with (acute) exacerbation: Secondary | ICD-10-CM | POA: Diagnosis not present

## 2018-04-07 MED ORDER — FLUCONAZOLE 150 MG PO TABS: 150.0000 mg | ORAL_TABLET | Freq: Once | ORAL | 0 refills | Status: AC

## 2018-04-07 MED ORDER — KETOROLAC TROMETHAMINE 60 MG/2ML IM SOLN
60.0000 mg | Freq: Once | INTRAMUSCULAR | Status: AC
  Administered 2018-04-07: 60 mg via INTRAMUSCULAR

## 2018-04-07 MED ORDER — HYDROCODONE-HOMATROPINE 5-1.5 MG/5ML PO SYRP: 5.0000 mL | ORAL_SOLUTION | Freq: Four times a day (QID) | ORAL | 0 refills | Status: DC | PRN

## 2018-04-07 MED ORDER — PREDNISONE 20 MG PO TABS: ORAL_TABLET | ORAL | 0 refills | Status: DC

## 2018-04-07 MED ORDER — AZITHROMYCIN 250 MG PO TABS: ORAL_TABLET | ORAL | 0 refills | Status: DC

## 2018-04-07 NOTE — Progress Notes (Signed)
   [name] - 63 y.o. female MRN [id number]  Date of birth: Jul 04, 1955  Office Visit Note: Visit Date: 04/07/2018 PCP: Bennie Pierini, FNP Referred by: Daphine Deutscher, [name], *  Subjective: Chief Complaint  Patient presents with  . Right Hip - Pain  . Left Hip - Pain   HPI:  [name] is a 63 y.o. female who comes in today For planned bilateral greater trochanteric bursa injections.  Duda body habitus we do complete these injections with fluoroscopic guidance.  Last injection was very beneficial, that was in June 2019.  Since that time she did have a fall and was evaluated by Dr. Lavada Mesi.  She does have prior lumbar fusion.  She still complains of back pain.  She will follow-up with Dr. Otelia Sergeant for her back.  Pain today is mostly over the greater trochanters into the thigh.  No numbness tingling or paresthesia.  Exam shows painful tenderness over both greater trochanters that is pretty exquisite.  ROS Otherwise per HPI.  Assessment & Plan: Visit Diagnoses:  1. Greater trochanteric bursitis, left   2. Greater trochanteric bursitis, right     Plan: No additional findings.   Meds & Orders: No orders of the defined types were placed in this encounter.   Orders Placed This Encounter  Procedures  . Large Joint Inj: bilateral greater trochanter  . XR C-ARM NO REPORT    Follow-up: Return if symptoms worsen or fail to improve.   Procedures: Large Joint Inj: bilateral greater trochanter on 04/07/2018 9:52 AM Indications: pain and diagnostic evaluation Details: 22 G 3.5 in needle, fluoroscopy-guided lateral approach  Arthrogram: No  Medications (Right): 6 mL bupivacaine 0.25 %; 40 mg triamcinolone acetonide 40 MG/ML Medications (Left): 6 mL bupivacaine 0.25 %; 40 mg triamcinolone acetonide 40 MG/ML Outcome: tolerated well, no immediate complications  There was excellent flow of contrast outlined the greater trochanteric bursa without vascular  uptake. Procedure, treatment alternatives, risks and benefits explained, specific risks discussed. Consent was given by the patient. Immediately prior to procedure a time out was called to verify the correct patient, procedure, equipment, support staff and site/side marked as required. Patient was prepped and draped in the usual sterile fashion.      No notes on file   Clinical History: DG HIP (WITH OR WITHOUT PELVIS) 2-3V LEFT  COMPARISON:  April 16, 2017  FINDINGS: Frontal pelvis as well as frontal and lateral left hip images were obtained. No acute fracture or dislocation evident. 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 No erosive change. There is postoperative change in the lower lumbar spine. Sacroiliac joints appear normal bilaterally.  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 No acute fracture or dislocation. Postoperative change lower lumbar spine.   Electronically Signed   By: Bretta Bang III M.D.   On: 08/10/2017 14:23     Objective:  VS:  HT:    WT:   BMI:     BP:   HR: bpm  TEMP: ( )  RESP:  Physical Exam  Ortho Exam Imaging: No results found.

## 2018-04-07 NOTE — Telephone Encounter (Signed)
Please advise on request for medication. 

## 2018-04-07 NOTE — Patient Instructions (Signed)

## 2018-04-07 NOTE — Telephone Encounter (Signed)
Pharmacy [facility]   PT states that she is coughing really bad and wheezing, has been using nebulizer but not helping coughing really bad at night too , if we can call in cough syrup works better than the pill

## 2018-04-07 NOTE — Telephone Encounter (Signed)
lvm for patient to call me back to ask her about her information about her loop recorder.

## 2018-04-07 NOTE — Telephone Encounter (Signed)
She needs to be seen.

## 2018-04-07 NOTE — Telephone Encounter (Signed)
[employer] Medicare/Medicaid no auth.  Patient has a loop recorder. Do you know any information on the loop recorder?

## 2018-04-07 NOTE — Progress Notes (Signed)
 .  Numeric Pain Rating Scale and Functional Assessment Average Pain 10   In the last MONTH (on 0-10 scale) has pain interfered with the following?  1. General activity like being  able to carry out your everyday physical activities such as walking, climbing stairs, carrying groceries, or moving a chair?  Rating(9)   -Dye Allergies.  

## 2018-04-07 NOTE — Telephone Encounter (Signed)
I do not have anything in regards to the information about the loop recorder, it apparently was placed quite a number of years ago, at least since 2013.  I asked the patient about the loop recorder, she claimed that it was MRI compatible, but without confirmation of this I am not sure.

## 2018-04-07 NOTE — Progress Notes (Signed)
BP (!) 178/96   Pulse 78   Temp (!) 97.2 F (36.2 C) (Oral)   Ht  (1.575 m)   Wt 153 lb 6.4 oz (69.6 kg)   SpO2 96%   BMI 28.06 kg/m    Subjective:    Patient ID: [name], female    DOB: 1955-06-01, 63 y.o.   MRN: [id number]  HPI: [name] is a 63 y.o. female presenting on 04/07/2018 for Cough (Patient states it has been on and off since October and started back 3-4 days ago.); Wheezing; and Headache (Patient states that she got two steriod injections in her hips and it has given her a headache.)   HPI Cough and congestion and wheezing Patient says she is had increased cough off and on since October but really over the past 3 or 4 days she has developed a significant amount of cough and congestion and wheezing and chest tightness and difficulty breathing.  She says is been worsening over the past 3 or 4 days.  She has been using her inhalers including Brio and albuterol and her nebulizer machine which have been helping but she still seems to be getting worse overall.  She says she did get to bilateral hip injections morning has a headache from it and wonders if she can get a Toradol to help stave off a migraine that she feels is coming on.  Her cough is been nonproductive and has been keeping her up at night.  Relevant past medical, surgical, family and social history reviewed and updated as indicated. Interim medical history since our last visit reviewed. Allergies and medications reviewed and updated.  Review of Systems  Constitutional: Negative for chills and fever.  HENT: Positive for congestion and postnasal drip. Negative for ear discharge, ear pain, rhinorrhea, sinus pressure, sneezing and sore throat.   Eyes: Negative for pain, redness and visual disturbance.  Respiratory: Positive for cough, shortness of breath and wheezing. Negative for chest tightness.   Cardiovascular: Negative for chest pain and leg swelling.  Musculoskeletal: Negative for back pain  and gait problem.  Skin: Negative for rash.  Neurological: Negative for light-headedness and headaches.  Psychiatric/Behavioral: Negative for agitation and behavioral problems.  All other systems reviewed and are negative.   Per HPI unless specifically indicated above   Allergies as of 04/07/2018      Reactions   Codeine Other (See Comments)   "I will have a heart attack."   Morphine And Related Other (See Comments)   "It will cause me to have a heart attack."   Ambien [zolpidem Tartrate] Nausea And Vomiting   Lyrica [pregabalin] Swelling, Other (See Comments)   Weight gain   Neurontin [gabapentin] Other (See Comments)   Causes elevated LFTs      Medication List       Accurate as of April 07, 2018  2:55 PM. Always use your most recent med list.        acyclovir ointment 5 % Commonly known as:  ZOVIRAX APPLY TO AFFECTED AREA EVERY 3 HOURS   albuterol (2.5 MG/3ML) 0.083% nebulizer solution Commonly known as:  PROVENTIL USE 1 VIAL IN NEBULIZER EVERY 4 HOURS AS NEEDED FOR WHEEZING.   albuterol 108 (90 Base) MCG/ACT inhaler Commonly known as:  VENTOLIN HFA INHALE 2 PUFFS EVERY 6 HOURS AS NEEDED FOR SHORTNESS OF BREATH AND WHEEZING.   Alcohol Pads 70 % Pads USE 1 PAD DAILY WHEN CHECKING BLOOD SUGAR.   ALLERGY 10 MG tablet Generic  drug:  loratadine TAKE 1 TABLET ONCE DAILY.   ALPRAZolam 0.5 MG tablet Commonly known as:  XANAX Take 2 tablets approximately 45 minutes prior to the MRI study, take a third tablet if needed.   azithromycin 250 MG tablet Commonly known as:  ZITHROMAX Take 2 the first day and then one each day after.   busPIRone 10 MG tablet Commonly known as:  BUSPAR 1 po bid prn   butalbital-acetaminophen-caffeine 50-325-40 MG tablet Commonly known as:  FIORICET, ESGIC TAKE 1 TABLET TWICE DAILY AS NEEDED FOR HEADACHE.   carvedilol 3.125 MG tablet Commonly known as:  COREG Take 1 tablet (3.125 mg total) by mouth 4 (four) times daily as needed.     DEXILANT 60 MG capsule Generic drug:  dexlansoprazole TAKE 1 CAPSULE DAILY.   diazepam 5 MG tablet Commonly known as:  VALIUM 1 po bid   DULoxetine 60 MG capsule Commonly known as:  CYMBALTA Take 1 capsule (60 mg total) by mouth at bedtime.   escitalopram 20 MG tablet Commonly known as:  LEXAPRO TAKE (1) TABLET BY MOUTH ONCE DAILY.   estradiol 0.1 MG/GM vaginal cream Commonly known as:  ESTRACE PLACE 1 APPLICATORFUL VAGINALLY AT BEDTIME.   fluconazole 150 MG tablet Commonly known as:  DIFLUCAN Take 1 tablet (150 mg total) by mouth once for 1 dose.   fludrocortisone 0.1 MG tablet Commonly known as:  FLORINEF TAKE 2 TABLETS BY MOUTH DAILY.   fluticasone 50 MCG/ACT nasal spray Commonly known as:  FLONASE Place 2 sprays into both nostrils daily.   fluticasone furoate-vilanterol 200-25 MCG/INH Aepb Commonly known as:  BREO ELLIPTA Inhale 1 puff into the lungs daily.   furosemide 40 MG tablet Commonly known as:  LASIX TAKE 1 TABLET AS NEEDED FOR SWELLING   glucose blood test strip Commonly known as:  ONETOUCH VERIO Test blood sugar daily   HYDROcodone-acetaminophen 7.5-325 MG tablet Commonly known as:  NORCO Take 1 tablet by mouth 2 (two) times daily.   HYDROcodone-homatropine 5-1.5 MG/5ML syrup Commonly known as:  HYCODAN Take 5 mLs by mouth every 6 (six) hours as needed for cough.   ipratropium 0.02 % nebulizer solution Commonly known as:  ATROVENT USE 1 VIAL IN NEBULIZER EVERY 4 HOURS AS NEEDED FOR WHEEZING.   isosorbide mononitrate 30 MG 24 hr tablet Commonly known as:  IMDUR Take 1 tablet (30 mg total) by mouth daily.   lamoTRIgine 150 MG tablet Commonly known as:  LAMICTAL TAKE 1 TABLET BY MOUTH ONCE DAILY.   levETIRAcetam 500 MG tablet Commonly known as:  KEPPRA TAKE (1) TABLET TWICE DAILY.   nitroGLYCERIN 0.4 MG SL tablet Commonly known as:  NITROSTAT PLACE ONE (1) TABLET UNDER TONGUE EVERY 5 MINUTES UP TO (3) DOSES AS NEEDED FOR CHEST PAIN.    ondansetron 4 MG tablet Commonly known as:  ZOFRAN TAKE 1 TO 2 TABLETS AS NEEDED FOR NAUSEA.   ONETOUCH DELICA LANCETS 33G Misc TEST BLOOD SUGAR ONCE DAILY.   oxybutynin 5 MG tablet Commonly known as:  DITROPAN TAKE (1) TABLET TWICE DAILY.   predniSONE 20 MG tablet Commonly known as:  DELTASONE Take 3 tabs daily for 1 week, then 2 tabs daily for week 2, then 1 tab daily for week 3.   ranitidine 150 MG tablet Commonly known as:  ZANTAC TAKE 1 TABLET BY MOUTH AT BEDTIME.   rosuvastatin 10 MG tablet Commonly known as:  CRESTOR TAKE (1) TABLET BY MOUTH ONCE DAILY.   sucralfate 1 GM/10ML suspension Commonly known as:  CARAFATE Take 10 mLs (1 g total) by mouth 2 (two) times daily.   tiZANidine 4 MG tablet Commonly known as:  ZANAFLEX TAKE (1) TABLET EVERY SIX HOURS AS NEEDED FOR MUSCLE SPASMS.   tolterodine 2 MG tablet Commonly known as:  DETROL TAKE (1) TABLET TWICE DAILY.   traZODone 150 MG tablet Commonly known as:  DESYREL TAKE 1 TABLET BY MOUTH AT BEDTIME.   valACYclovir 500 MG tablet Commonly known as:  VALTREX TAKE (1) TABLET TWICE DAILY.          Objective:    BP (!) 178/96   Pulse 78   Temp (!) 97.2 F (36.2 C) (Oral)   Ht 5\' 2"  (1.575 m)   Wt 153 lb 6.4 oz (69.6 kg)   SpO2 96%   BMI 28.06 kg/m   Wt Readings from Last 3 Encounters:  04/07/18 153 lb 6.4 oz (69.6 kg)  04/06/18 153 lb (69.4 kg)  03/16/18 152 lb (68.9 kg)    Physical Exam Vitals signs reviewed.  Constitutional:      General: She is not in acute distress.    Appearance: She is well-developed. She is not diaphoretic.  HENT:     Right Ear: Tympanic membrane, ear canal and external ear normal.     Left Ear: Tympanic membrane, ear canal and external ear normal.     Nose: Mucosal edema present. No rhinorrhea.     Right Sinus: No maxillary sinus tenderness or frontal sinus tenderness.     Left Sinus: No maxillary sinus tenderness or frontal sinus tenderness.     Mouth/Throat:      Pharynx: Uvula midline. No oropharyngeal exudate or posterior oropharyngeal erythema.     Tonsils: No tonsillar abscesses.  Eyes:     Conjunctiva/sclera: Conjunctivae normal.  Cardiovascular:     Rate and Rhythm: Normal rate and regular rhythm.     Heart sounds: Normal heart sounds. No murmur.  Pulmonary:     Effort: Pulmonary effort is normal. No respiratory distress.     Breath sounds: Wheezing (Significant wheezes throughout all lung fields) and rhonchi present.  Musculoskeletal: Normal range of motion.        General: No tenderness.  Skin:    General: Skin is warm and dry.     Findings: No rash.  Neurological:     Mental Status: She is alert and oriented to person, place, and time.     Coordination: Coordination normal.  Psychiatric:        Behavior: Behavior normal.         Assessment & Plan:   Problem List Items Addressed This Visit    None    Visit Diagnoses    COPD exacerbation (HCC)    -  Primary   Relevant Medications   azithromycin (ZITHROMAX) 250 MG tablet   predniSONE (DELTASONE) 20 MG tablet   fluconazole (DIFLUCAN) 150 MG tablet   HYDROcodone-homatropine (HYCODAN) 5-1.5 MG/5ML syrup   Nonintractable headache, unspecified chronicity pattern, unspecified headache type       Relevant Medications   azithromycin (ZITHROMAX) 250 MG tablet   predniSONE (DELTASONE) 20 MG tablet   fluconazole (DIFLUCAN) 150 MG tablet   HYDROcodone-homatropine (HYCODAN) 5-1.5 MG/5ML syrup   ketorolac (TORADOL) injection 60 mg (Start on 04/07/2018  3:00 PM)      Will treat like COPD exacerbation, had steroid injection today so we will give Toradol instead and have her start the prednisone tomorrow. Follow up plan: Return if symptoms worsen or fail to improve.  Counseling provided for all of the vaccine components No orders of the defined types were placed in this encounter.   Arville CareJoshua Dettinger, MD [facility] 04/07/2018, 2:55 PM

## 2018-04-07 NOTE — Telephone Encounter (Signed)
Aware. 

## 2018-04-12 ENCOUNTER — Telehealth: Payer: Self-pay | Admitting: Cardiology

## 2018-04-12 MED ORDER — TRIAMCINOLONE ACETONIDE 40 MG/ML IJ SUSP
40.0000 mg | INTRAMUSCULAR | Status: AC | PRN
  Administered 2018-04-07: 40 mg via INTRA_ARTICULAR

## 2018-04-12 MED ORDER — BUPIVACAINE HCL 0.25 % IJ SOLN
6.0000 mL | INTRAMUSCULAR | Status: AC | PRN
  Administered 2018-04-07: 6 mL via INTRA_ARTICULAR

## 2018-04-12 NOTE — Telephone Encounter (Signed)
Fwd to Dr. Wyline Mood nurse.

## 2018-04-12 NOTE — Telephone Encounter (Signed)
Patient asked for called back to be in a week. Stated things are hectic right now with her grandbaby being sick and in the hospital

## 2018-04-12 NOTE — Telephone Encounter (Signed)
Did not go to appointment in [place] due to [facility] not taking here. She is currently [place] and will not be back until May 01, 2018   She is asking for another appointment to be set up?

## 2018-04-27 ENCOUNTER — Ambulatory Visit: Payer: 59 | Admitting: Cardiology

## 2018-05-03 ENCOUNTER — Other Ambulatory Visit: Payer: Self-pay | Admitting: Nurse Practitioner

## 2018-05-03 DIAGNOSIS — J309 Allergic rhinitis, unspecified: Secondary | ICD-10-CM

## 2018-05-05 ENCOUNTER — Other Ambulatory Visit: Payer: 59

## 2018-05-05 ENCOUNTER — Other Ambulatory Visit: Payer: Self-pay | Admitting: Internal Medicine

## 2018-05-05 ENCOUNTER — Other Ambulatory Visit: Payer: Self-pay | Admitting: Nurse Practitioner

## 2018-05-05 ENCOUNTER — Other Ambulatory Visit: Payer: Self-pay | Admitting: Cardiology

## 2018-05-06 ENCOUNTER — Other Ambulatory Visit: Payer: Self-pay | Admitting: Internal Medicine

## 2018-05-06 NOTE — Telephone Encounter (Signed)
Pt's pharmacy is requesting a refill on ondanstron 4 mg tablet. Would Dr. Ladona Ridgel like to refill this medication? Please address

## 2018-05-08 ENCOUNTER — Other Ambulatory Visit: Payer: Self-pay | Admitting: Nurse Practitioner

## 2018-05-10 ENCOUNTER — Telehealth: Payer: Self-pay | Admitting: Nurse Practitioner

## 2018-05-10 NOTE — Telephone Encounter (Signed)
Call and see what they want

## 2018-05-10 NOTE — Telephone Encounter (Signed)
Dewayne Hatch states that patient now has a puppy and states that patient and Higher education careers adviser will train this dog to be a service dog and detect seizures.  Dewayne Hatch states that the dog will have to go through specific training to be a service dog and is wanting to know what MMM wants to do.

## 2018-05-11 ENCOUNTER — Telehealth: Payer: Self-pay | Admitting: Nurse Practitioner

## 2018-05-11 ENCOUNTER — Other Ambulatory Visit: Payer: Self-pay | Admitting: Cardiology

## 2018-05-11 NOTE — Telephone Encounter (Signed)
Patient aware.

## 2018-05-11 NOTE — Telephone Encounter (Signed)
PT has called states that her bronchitis has flared up again due to weather, she is having a really bad cough, been going on for 3 days and she has tried taking nyquil for cough but that isn't touching it, Pt wants to know if MMM would send her in something or will she need to come in to be seen. Pt is wanting to know if we can send in cough syrup like last time, because that seemed to help a lot.  Pharmacy: [facility]

## 2018-05-11 NOTE — Telephone Encounter (Signed)
Patient needs to know that K9 officer cannot train dog.. has to go through certified training in order to detect seizures

## 2018-05-11 NOTE — Telephone Encounter (Signed)
Delsym OTC 

## 2018-05-12 ENCOUNTER — Other Ambulatory Visit: Payer: Self-pay | Admitting: Nurse Practitioner

## 2018-05-12 DIAGNOSIS — M47817 Spondylosis without myelopathy or radiculopathy, lumbosacral region: Secondary | ICD-10-CM

## 2018-05-12 DIAGNOSIS — G44229 Chronic tension-type headache, not intractable: Secondary | ICD-10-CM

## 2018-05-17 ENCOUNTER — Ambulatory Visit (INDEPENDENT_AMBULATORY_CARE_PROVIDER_SITE_OTHER): Payer: 59

## 2018-05-17 ENCOUNTER — Ambulatory Visit (INDEPENDENT_AMBULATORY_CARE_PROVIDER_SITE_OTHER): Payer: 59 | Admitting: Family

## 2018-05-17 ENCOUNTER — Encounter: Payer: Self-pay | Admitting: Family

## 2018-05-17 VITALS — BP 164/99 | HR 87 | Temp 98.6°F | Ht 62.0 in | Wt 147.8 lb

## 2018-05-17 DIAGNOSIS — J029 Acute pharyngitis, unspecified: Secondary | ICD-10-CM | POA: Diagnosis not present

## 2018-05-17 DIAGNOSIS — J020 Streptococcal pharyngitis: Secondary | ICD-10-CM

## 2018-05-17 DIAGNOSIS — J209 Acute bronchitis, unspecified: Secondary | ICD-10-CM

## 2018-05-17 LAB — RAPID STREP SCREEN (MED CTR MEBANE ONLY): Strep Gp A Ag, IA W/Reflex: POSITIVE — AB

## 2018-05-17 MED ORDER — METHYLPREDNISOLONE ACETATE 80 MG/ML IJ SUSP
80.0000 mg | Freq: Once | INTRAMUSCULAR | Status: AC
  Administered 2018-05-17: 80 mg via INTRAMUSCULAR

## 2018-05-17 MED ORDER — AMOXICILLIN-POT CLAVULANATE 875-125 MG PO TABS: 1.0000 | ORAL_TABLET | Freq: Two times a day (BID) | ORAL | 0 refills | Status: DC

## 2018-05-17 MED ORDER — HYDROCODONE-HOMATROPINE 5-1.5 MG/5ML PO SYRP: 5.0000 mL | ORAL_SOLUTION | Freq: Three times a day (TID) | ORAL | 0 refills | Status: DC | PRN

## 2018-05-17 NOTE — Patient Instructions (Signed)
Strep Throat    Strep throat is a bacterial infection of the throat. Your health care provider may call the infection tonsillitis or pharyngitis, depending on whether there is swelling in the tonsils or at the back of the throat. Strep throat is most common during the cold months of the year in children who are 5-62 years of age, but it can happen during any season in people of any age. This infection is spread from person to person (contagious) through coughing, sneezing, or close contact.  What are the causes?  Strep throat is caused by the bacteria called Streptococcus pyogenes.  What increases the risk?  This condition is more likely to develop in:  · People who spend time in crowded places where the infection can spread easily.  · People who have close contact with someone who has strep throat.  What are the signs or symptoms?  Symptoms of this condition include:  · Fever or chills.  · Redness, swelling, or pain in the tonsils or throat.  · Pain or difficulty when swallowing.  · White or yellow spots on the tonsils or throat.  · Swollen, tender glands in the neck or under the jaw.  · Red rash all over the body (rare).  How is this diagnosed?  This condition is diagnosed by performing a rapid strep test or by taking a swab of your throat (throat culture test). Results from a rapid strep test are usually ready in a few minutes, but throat culture test results are available after one or two days.  How is this treated?  This condition is treated with antibiotic medicine.  Follow these instructions at home:  Medicines  · Take over-the-counter and prescription medicines only as told by your health care provider.  · Take your antibiotic as told by your health care provider. Do not stop taking the antibiotic even if you start to feel better.  · Have family members who also have a sore throat or fever tested for strep throat. They may need antibiotics if they have the strep infection.  Eating and drinking  · Do not  share food, drinking cups, or personal items that could cause the infection to spread to other people.  · If swallowing is difficult, try eating soft foods until your sore throat feels better.  · Drink enough fluid to keep your urine clear or pale yellow.  General instructions  · Gargle with a salt-water mixture 3-4 times per day or as needed. To make a salt-water mixture, completely dissolve ½-1 tsp of salt in 1 cup of warm water.  · Make sure that all household members wash their hands well.  · Get plenty of rest.  · Stay home from school or work until you have been taking antibiotics for 24 hours.  · Keep all follow-up visits as told by your health care provider. This is important.  Contact a health care provider if:  · The glands in your neck continue to get bigger.  · You develop a rash, cough, or earache.  · You cough up a thick liquid that is green, yellow-brown, or bloody.  · You have pain or discomfort that does not get better with medicine.  · Your problems seem to be getting worse rather than better.  · You have a fever.  Get help right away if:  · You have new symptoms, such as vomiting, severe headache, stiff or painful neck, chest pain, or shortness of breath.  · You have severe throat   pain, drooling, or changes in your voice.  · You have swelling of the neck, or the skin on the neck becomes red and tender.  · You have signs of dehydration, such as fatigue, dry mouth, and decreased urination.  · You become increasingly sleepy, or you cannot wake up completely.  · Your joints become red or painful.  This information is not intended to replace advice given to you by your health care provider. Make sure you discuss any questions you have with your health care provider.  Document Released: 03/14/2000 Document Revised: 11/14/2015 Document Reviewed: 07/10/2014  Elsevier Interactive Patient Education © 2019 [employer]

## 2018-05-17 NOTE — Progress Notes (Signed)
Subjective:    Patient ID: [name], female    DOB: Oct 07, 1955, 63 y.o.   MRN: [id number]  Chief Complaint  Patient presents with  . Sore Throat    finished medications given at Urgent Care  . wheezing and cough   Pt   presents to the office today with complaitns of wheezing and sore throat. 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 She  Went to the Urgent two weeks ago and was given Zpak, cough medication, prednisone, and steroid injection. She states she started feeling slightly better, but her cough never went away.  Sore Throat   This is a new problem. The problem has been gradually worsening. The maximum temperature recorded prior to her arrival was 100.4 - 100.9 F. The pain is at a severity of 10/10. The pain is moderate. Associated symptoms include coughing, headaches, a hoarse voice, shortness of breath, swollen glands and trouble swallowing. Pertinent negatives include no congestion or ear pain. She has tried acetaminophen and NSAIDs for the symptoms. The treatment provided mild relief.  Wheezing   This is a new problem. The current episode started 1 to 4 weeks ago. The problem occurs intermittently. The problem has been waxing and waning. Associated symptoms include chills, coughing, headaches, shortness of breath and swollen glands. Pertinent negatives include no ear pain. She has tried rest, steroid inhaler and prescription cough suppressant for the symptoms. The treatment provided mild relief.      Review of Systems  Constitutional: Positive for chills.  HENT: Positive for hoarse voice and trouble swallowing. Negative for congestion and ear pain.   Respiratory: Positive for cough, shortness of breath and wheezing.   Neurological: Positive for headaches.  All other systems reviewed and are negative.      Objective:   Physical Exam Vitals signs reviewed.  Constitutional:      General: She is not in acute distress.    Appearance: She is  well-developed. She is ill-appearing.  HENT:     Head: Normocephalic and atraumatic.     Right Ear: External ear normal.     Nose: Mucosal edema and rhinorrhea present.     Mouth/Throat:     Pharynx: Pharyngeal swelling, oropharyngeal exudate and posterior oropharyngeal erythema present.     Tonsils: Tonsillar exudate present.  Eyes:     Pupils: Pupils are equal, round, and reactive to light.  Neck:     Musculoskeletal: Normal range of motion and neck supple.     Thyroid: No thyromegaly.  Cardiovascular:     Rate and Rhythm: Normal rate and regular rhythm.     Heart sounds: Normal heart sounds. No murmur.  Pulmonary:     Effort: Pulmonary effort is normal. No respiratory distress.     Breath sounds: Wheezing present.     Comments: Constant nonproductive cough Abdominal:     General: Bowel sounds are normal. There is no distension.     Palpations: Abdomen is soft.     Tenderness: There is no abdominal tenderness.  Musculoskeletal: Normal range of motion.        General: No tenderness.  Skin:    General: Skin is warm and dry.  Neurological:     Mental Status: She is alert and oriented to person, place, and time.     Cranial Nerves: No cranial nerve deficit.     Deep Tendon Reflexes: Reflexes are normal and symmetric.  Psychiatric:        Behavior: Behavior normal.  Thought Content: Thought content normal.        Judgment: Judgment normal.       BP (!) 164/99   Pulse 87   Temp 98.6 F (37 C) (Oral)   Ht 5\' 2"  (1.575 m)   Wt 147 lb 12.8 oz (67 kg)   BMI 27.03 kg/m      Assessment & Plan:  RHEANA KLEISER comes in today with chief complaint of Sore Throat (finished medications given at Urgent Care) and wheezing and cough   Diagnosis and orders addressed:  1. Sore throat - Rapid Strep Screen ([facility] ONLY) - methylPREDNISolone acetate (DEPO-MEDROL) injection 80 mg  2. Strep throat - Take meds as prescribed - Use a cool mist humidifier  -Use  saline nose sprays frequently -Force fluids -For any cough or congestion  Use plain Mucinex- regular strength or max strength is fine -For fever or aces or pains- take tylenol or ibuprofen. -Throat lozenges if help -New toothbrush in 3 days - amoxicillin-clavulanate (AUGMENTIN) 875-125 MG tablet; Take 1 tablet by mouth 2 (two) times daily for 10 days.  Dispense: 20 tablet; Refill: 0 - methylPREDNISolone acetate (DEPO-MEDROL) injection 80 mg - DG Chest 2 View; Future  3. Acute bronchitis, unspecified organism Start Augmentin Continue Nebs as needed Strict low carb diet - methylPREDNISolone acetate (DEPO-MEDROL) injection 80 mg - DG Chest 2 View; Future      Jannifer Rodney, FNP

## 2018-05-24 ENCOUNTER — Ambulatory Visit (INDEPENDENT_AMBULATORY_CARE_PROVIDER_SITE_OTHER): Payer: 59 | Admitting: Neurology

## 2018-05-24 ENCOUNTER — Telehealth: Payer: Self-pay | Admitting: Neurology

## 2018-05-24 ENCOUNTER — Ambulatory Visit (INDEPENDENT_AMBULATORY_CARE_PROVIDER_SITE_OTHER): Payer: 59 | Admitting: Cardiology

## 2018-05-24 ENCOUNTER — Encounter: Payer: Self-pay | Admitting: Cardiology

## 2018-05-24 VITALS — BP 171/106 | HR 96 | Ht 61.0 in | Wt 152.0 lb

## 2018-05-24 DIAGNOSIS — G909 Disorder of the autonomic nervous system, unspecified: Secondary | ICD-10-CM | POA: Diagnosis not present

## 2018-05-24 DIAGNOSIS — R0789 Other chest pain: Secondary | ICD-10-CM

## 2018-05-24 DIAGNOSIS — I10 Essential (primary) hypertension: Secondary | ICD-10-CM | POA: Diagnosis not present

## 2018-05-24 DIAGNOSIS — R55 Syncope and collapse: Secondary | ICD-10-CM

## 2018-05-24 MED ORDER — CARVEDILOL 6.25 MG PO TABS: 6.2500 mg | ORAL_TABLET | Freq: Four times a day (QID) | ORAL | 3 refills | Status: DC | PRN

## 2018-05-24 NOTE — Procedures (Signed)
    History:  [name] is a 63 year old patient with a history of syncopal events associated with drops in blood pressure.  The patient has events as well generalized jerking unassociated with loss of consciousness.  The patient is being evaluated for these events.  This is a routine EEG.  No skull defects are noted.  Medications include Proventil, Ventolin, BuSpar, Fioricet, Dexilant, Cymbalta, Lexapro, Florinef, Flonase, Lasix, hydrocodone, Atrovent, Imdur, Lamictal, Keppra, nitroglycerin, Zofran, Ditropan, Crestor, Carafate, Zanaflex, Detrol, trazodone, and Valtrex.  EEG classification: Normal awake  Description of the recording: The background rhythms of this recording consists of a fairly well modulated medium amplitude alpha rhythm of 11 Hz that is reactive to eye opening and closure. As the record progresses, the patient appears to remain in the waking state throughout the recording. Photic stimulation was performed, resulting in a bilateral and symmetric photic driving response. Hyperventilation was also performed, resulting in a minimal buildup of the background rhythm activities without significant slowing seen. At no time during the recording does there appear to be evidence of spike or spike wave discharges or evidence of focal slowing. EKG monitor shows no evidence of cardiac rhythm abnormalities with a heart rate of 60.  Impression: This is a normal EEG recording in the waking state. No evidence of ictal or interictal discharges are seen.

## 2018-05-24 NOTE — Patient Instructions (Addendum)
Medication Instructions:   Your physician has recommended you make the following change in your medication:   Increase carvedilol to 6.25 mg by mouth four times daily as needed  Continue all other medications the same  Labwork:  NONE  Testing/Procedures:  NONE  Follow-Up:  Your physician recommends that you schedule a follow-up appointment in: 3 months.  You have been referred to Dr. Lanae Boast at [facility].  Any Other Special Instructions Will Be Listed Below (If Applicable).  If you need a refill on your cardiac medications before your next appointment, please call your pharmacy.

## 2018-05-24 NOTE — Progress Notes (Signed)
Clinical Summary [name] is a 63 y.o.female seen today for follow up of the following medical problems.  1. Chest pain - several year history of chest pain - normal cath in 2013, normal stress test 2013 - admit to [facility] 07/2014 with chest pain. Negative workup for ACS. She was discharged with an out patient Lexiscan which showed no evidence of ischemia.  - echo 11/2014 LVEF 55-60%, no WMAs.  - 09/2017 nuclear stress no ischemia.  - started on imdur by pcp with improved symptoms.   - no recent chest pain.   2. 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 Got out of bed to bathroom, in bathroom felt lightheaded and dizzy. Feel to floor, + LOC. Unsure how long she was out for. Daughter got her back into bed, checked bp and it was 60/52.   12/09/2017  admitted to [facility] with episode, reprorts low bp's at that time.   - takes coreg about 2-3 days a week for severely elevated bp's. Has not taken lasix.    - referred to autonomic specialist at [facility]. Issues getting arranged with transporation.  - EEG and MRI pending by neuro - 3 blackout episodes since last visit.    3. HTN - high bp's recnetly.  - she refuses to lower the dose of her florinef, asks for higher doses of coreg.   Past Medical History:  Diagnosis Date  . Anginal pain (HCC)    last time   . Anxiety   . Arthritis    RHEUMATOID  . Asthma   . Bipolar 1 disorder (HCC)   . Cataracts, bilateral 07/2017  . COPD (chronic obstructive pulmonary disease) (HCC)   . Coronary artery disease    reported hx of "MI";  Echo 2009 with normal LVF;  Myoview 05/2011: no ischemia  . Depression   . Dyslipidemia   . Dysrhythmia    SVT  . Esophageal  stricture   . Fibromyalgia   . GERD (gastroesophageal reflux disease)   . H/O hiatal hernia   . Head injury, unspecified   . Herpes simplex infection   . History of kidney stones   . Hyperlipidemia   . Hypertension   . Insomnia   . Myocardial infarction [facility])    age 42  . Osteoporosis   . Pneumonia    hx  . Seizures (HCC)    last 3 weeks ago- prescribed keppra -never took didnt like side effects read about online  . Shortness of breath   . Sleep apnea    ? neg  . Spinal stenosis of lumbar region   . Spondylolisthesis   . Status post placement of implantable loop recorder   . Supraventricular tachycardia (HCC)   . Syncope and collapse    s/p ILR; no arhythmogenic cause identified  . UTI (lower urinary tract infection)      Allergies  Allergen Reactions  . Codeine Other (See Comments)    "I will have a heart attack."  . Morphine And Related Other (See Comments)    "It will cause me to have a heart attack."  . Ambien [Zolpidem Tartrate] Nausea And Vomiting  . Lyrica [Pregabalin] Swelling and Other (See Comments)    Weight gain  . Neurontin [Gabapentin] Other (See  Comments)    Causes elevated LFTs      Current Outpatient Medications  Medication Sig Dispense Refill  . acyclovir ointment (ZOVIRAX) 5 % APPLY TO AFFECTED AREA EVERY 3 HOURS 30 g 5  . albuterol (PROVENTIL) (2.5 MG/3ML) 0.083% nebulizer solution USE 1 VIAL IN NEBULIZER EVERY 4 HOURS AS NEEDED FOR WHEEZING. 180 mL 0  . albuterol (VENTOLIN HFA) 108 (90 Base) MCG/ACT inhaler INHALE 2 PUFFS EVERY 6 HOURS AS NEEDED FOR SHORTNESS OF BREATH AND WHEEZING. 18 g 5  . Alcohol Swabs (ALCOHOL PADS) 70 % PADS USE 1 PAD DAILY WHEN CHECKING BLOOD SUGAR. 100 each 2  . ALLERGY 10 MG tablet TAKE 1 TABLET ONCE DAILY. 30 tablet 4  . ALPRAZolam (XANAX) 0.5 MG tablet Take 2 tablets approximately 45 minutes prior to the MRI study, take a third tablet if needed. 3 tablet 0  . BREO ELLIPTA 200-25 MCG/INH AEPB INHALE 1 PUFF DAILY. 60  each 0  . busPIRone (BUSPAR) 10 MG tablet 1 po bid prn 60 tablet 5  . butalbital-acetaminophen-caffeine (FIORICET, ESGIC) 50-325-40 MG tablet TAKE 1 TABLET TWICE DAILY AS NEEDED FOR HEADACHE. 20 tablet 0  . carvedilol (COREG) 3.125 MG tablet Take 1 tablet (3.125 mg total) by mouth 4 (four) times daily as needed. 120 tablet 4  . DEXILANT 60 MG capsule TAKE 1 CAPSULE DAILY. 30 capsule 5  . diazepam (VALIUM) 5 MG tablet 1 po bid 60 tablet 2  . DULoxetine (CYMBALTA) 60 MG capsule Take 1 capsule (60 mg total) by mouth at bedtime. 30 capsule 5  . escitalopram (LEXAPRO) 20 MG tablet TAKE (1) TABLET BY MOUTH ONCE DAILY. 30 tablet 5  . estradiol (ESTRACE) 0.1 MG/GM vaginal cream PLACE 1 APPLICATORFUL VAGINALLY AT BEDTIME. 42.5 g 0  . fludrocortisone (FLORINEF) 0.1 MG tablet TAKE 2 TABLETS BY MOUTH ONCE DAILY. 60 tablet 6  . fluticasone (FLONASE) 50 MCG/ACT nasal spray Place 2 sprays into both nostrils daily. 16 g 6  . furosemide (LASIX) 40 MG tablet TAKE 1 TABLET AS NEEDED FOR SWELLING 90 tablet 1  . glucose blood (ONETOUCH VERIO) test strip Test blood sugar daily 100 each 3  . HYDROcodone-homatropine (HYCODAN) 5-1.5 MG/5ML syrup Take 5 mLs by mouth every 8 (eight) hours as needed for cough. 473 mL 0  . ipratropium (ATROVENT) 0.02 % nebulizer solution USE 1 VIAL IN NEBULIZER EVERY 4 HOURS AS NEEDED FOR WHEEZING. 150 mL 1  . isosorbide mononitrate (IMDUR) 30 MG 24 hr tablet Take 1 tablet (30 mg total) by mouth daily. 30 tablet 6  . lamoTRIgine (LAMICTAL) 150 MG tablet TAKE 1 TABLET BY MOUTH ONCE DAILY. 30 tablet 2  . levETIRAcetam (KEPPRA) 500 MG tablet TAKE (1) TABLET TWICE DAILY. 60 tablet 5  . nitroGLYCERIN (NITROSTAT) 0.4 MG SL tablet PLACE ONE (1) TABLET UNDER TONGUE EVERY 5 MINUTES UP TO (3) DOSES AS NEEDED FOR CHEST PAIN. 25 tablet 0  . ondansetron (ZOFRAN) 4 MG tablet TAKE 1 TO 2 TABLETS AS NEEDED FOR NAUSEA. 20 tablet 0  . ONETOUCH DELICA LANCETS 33G MISC TEST BLOOD SUGAR ONCE DAILY. 100 each 3    . oxybutynin (DITROPAN) 5 MG tablet TAKE (1) TABLET TWICE DAILY. 60 tablet 0  . ranitidine (ZANTAC) 150 MG tablet TAKE 1 TABLET BY MOUTH AT BEDTIME. 30 tablet 5  . rosuvastatin (CRESTOR) 10 MG tablet TAKE (1) TABLET BY MOUTH ONCE DAILY. 30 tablet 5  . sucralfate (CARAFATE) 1 GM/10ML suspension Take 10 mLs (1 g total) by mouth 2 (two)  times daily. 420 mL 2  . tiZANidine (ZANAFLEX) 4 MG tablet TAKE (1) TABLET EVERY SIX HOURS AS NEEDED FOR MUSCLE SPASMS. 30 tablet 2  . tolterodine (DETROL) 2 MG tablet TAKE (1) TABLET TWICE DAILY. 60 tablet 5  . traZODone (DESYREL) 150 MG tablet TAKE 1 TABLET BY MOUTH AT BEDTIME. 30 tablet 2  . valACYclovir (VALTREX) 500 MG tablet TAKE (1) TABLET TWICE DAILY. 60 tablet 5   Current Facility-Administered Medications  Medication Dose Route Frequency Provider Last Rate Last Dose  . triamcinolone acetonide (KENALOG-40) injection 40 mg  40 mg Other Once Johna Sheriff, MD         Past Surgical History:  Procedure Laterality Date  . BACK SURGERY    . BREAST SURGERY     lumpectomy  . CATARACT EXTRACTION W/PHACO Right 07/31/2017   Procedure: CATARACT EXTRACTION PHACO AND INTRAOCULAR LENS PLACEMENT (IOC);  Surgeon: Fabio Pierce, MD;  Location: [facility];  Service: Ophthalmology;  Laterality: Right;  CDE: 2.33  . CATARACT EXTRACTION W/PHACO Left 08/14/2017   Procedure: CATARACT EXTRACTION PHACO AND INTRAOCULAR LENS PLACEMENT (IOC);  Surgeon: Fabio Pierce, MD;  Location: [facility];  Service: Ophthalmology;  Laterality: Left;  CDE: 2.74  . CHOLECYSTECTOMY    . CYSTOSCOPY     stone  . DOPPLER ECHOCARDIOGRAPHY  2009  . head up tilt table testing  06/15/2007   [name]  . HEMORRHOID SURGERY    . insertion of implatable loop recorder  08/11/2007   [name]  . POSTERIOR CERVICAL FUSION/FORAMINOTOMY N/A 12/19/2013   Procedure: RIGHT C3-4.C4-5 AND C5-6 FORAMINOTOMIES;  Surgeon: Kerrin Champagne, MD;  Location: [facility] OR;  Service: Orthopedics;  Laterality: N/A;  . TUBAL  LIGATION       Allergies  Allergen Reactions  . Codeine Other (See Comments)    "I will have a heart attack."  . Morphine And Related Other (See Comments)    "It will cause me to have a heart attack."  . Ambien [Zolpidem Tartrate] Nausea And Vomiting  . Lyrica [Pregabalin] Swelling and Other (See Comments)    Weight gain  . Neurontin [Gabapentin] Other (See Comments)    Causes elevated LFTs       Family History  Problem Relation Age of Onset  . Heart attack Father   . Cancer Father   . Mental illness Father   . Cancer Mother   . Mental illness Mother   . Heart attack Brother        stents  . Alcohol abuse Brother   . Heart disease Brother   . Drug abuse Brother   . Diabetes Brother   . Colon cancer Maternal Aunt   . Cirrhosis Brother   . Stomach cancer Neg Hx      Social History Ms. Slates reports that she has never smoked. She has never used smokeless tobacco. Ms. Rusnak reports no history of alcohol use.   Review of Systems CONSTITUTIONAL: No weight loss, fever, chills, weakness or fatigue.  HEENT: Eyes: No visual loss, blurred vision, double vision or yellow sclerae.No hearing loss, sneezing, congestion, runny nose or sore throat.  SKIN: No rash or itching.  CARDIOVASCULAR:  RESPIRATORY: No shortness of breath, cough or sputum.  GASTROINTESTINAL: No anorexia, nausea, vomiting or diarrhea. No abdominal pain or blood.  GENITOURINARY: No burning on urination, no polyuria NEUROLOGICAL: No headache, dizziness, syncope, paralysis, ataxia, numbness or tingling in the extremities. No change in bowel or bladder control.  MUSCULOSKELETAL: No muscle, back pain, joint  pain or stiffness.  LYMPHATICS: No enlarged nodes. No history of splenectomy.  PSYCHIATRIC: No history of depression or anxiety.  ENDOCRINOLOGIC: No reports of sweating, cold or heat intolerance. No polyuria or polydipsia.  [name].   Physical Examination Vitals:   05/24/18 1513 05/24/18 1516  BP: (!)  171/108 (!) 171/106  Pulse: 97 96  SpO2: 94%    Filed Weights   05/24/18 1513  Weight: 152 lb (68.9 kg)    Gen: resting comfortably, no acute distress HEENT: no scleral icterus, pupils equal round and reactive, no palptable cervical adenopathy,  CV: per hpi Resp: Clear to auscultation bilaterally GI: abdomen is soft, non-tender, non-distended, normal bowel sounds, no hepatosplenomegaly MSK: extremities are warm, no edema.  Skin: warm, no rash Neuro:  Per hpi Psych: appropriate affect   Diagnostic Studies  05/2011 Lexiscan MPI No ischemia   Jan 2009 Echo LEFT VENTRICLE: - Left ventricular size was normal. - Overall left ventricular systolic function was normal. - There were no left ventricular regional wall motion abnormalities. - Left ventricular wall thickness was normal.  AORTIC VALVE: - The aortic valve was trileaflet. - Aortic valve thickness was normal.  AORTA: - The aortic root was normal in size. - The aortic arch was normal.  MITRAL VALVE: - Mitral valve structure was normal.  Doppler interpretation(s): - There was trivial mitral valvular regurgitation.  LEFT ATRIUM: - Left atrial size was normal.  PULMONARY VEINS: - The pulmonary veins were grossly normal.  RIGHT VENTRICLE: - Right ventricular size was normal. - Right ventricular systolic function was normal. - Right ventricular wall thickness was normal.  PULMONIC VALVE: - The structure of the pulmonic valve appeared to be normal.  TRICUSPID VALVE: - The tricuspid valve structure was normal.  Doppler interpretation(s): - There was no significant tricuspid valvular regurgitation.  PULMONARY ARTERY: - The pulmonary artery was normal size.  RIGHT ATRIUM: - Right atrial size was normal.  SYSTEMIC VEINS: - The inferior vena cava was normal.  PERICARDIUM: - There was no pericardial effusion.  ---------------------------------------------------------------  SUMMARY - Overall left  ventricular systolic function was normal. There were no left ventricular regional wall motion abnormalities. - The pulmonary veins were grossly normal.   09/2011 Cath Hemodynamic Findings: Central aortic pressure: 108/58  Left ventricular pressure: 107/10/14  Angiographic Findings:  Left main: No obstructive disease noted.  Left Anterior Descending Artery: Moderate to large sized vessel that courses to the apex. There is a moderate sized diagonal Ewan Grau. No obstructive disease noted.  Circumflex Artery: Large, dominant artery with moderate sized first obtuse marginal Tron Flythe and left sided posterolateral Navaeh Kehres with no disease noted.  Right Coronary Artery: Small, non-dominant vessel with no disease noted.  Left Ventricular Angiogram:LVEF=65%.  Impression:  1. No angiographic evidence of CAD  2. Normal LV systolic function  3. Non-cardiac chest pain  Recommendations: No further ischemic workup.  Complications: None. The patient tolerated the procedure well.   11/2014 echo Study Conclusions  - Left ventricle: The cavity size was normal. Systolic function was normal. The estimated ejection fraction was in the range of 55% to 60%. Wall motion was normal; there were no regional wall motion abnormalities. There was an increased relative contribution of atrial contraction to ventricular filling. Doppler parameters are consistent with abnormal left ventricular relaxation (grade 1 diastolic dysfunction). - Mitral valve: There was trivial regurgitation. - Tricuspid valve: There was trivial regurgitation. - Pulmonic valve: There was trivial regurgitation.   09/2017 nuclear stress  There was no ST segment deviation  noted during stress. Nonspecific T wave flattening in aVL and V2 seen throughout study.  Defect 1: There is a medium defect of mild severity present in the mid inferior, apical inferior and apical lateral location. This appears to be due to soft  tissue attenuation given normal regional wall motion. No ischemic territories.  This is a low risk study.  Nuclear stress EF: 65%.   Assessment and Plan  1. Chest pain - long history of symptoms with multiple negative stress tests, most recently 09/2017  Lexiscan was negative. Cath 2013 with patent vessels -- symptoms improved with imdur. Despite autonomic issues she is not willing to adjust imdur dosing -continue current meds  2. Syncope/Autonomic insufficiency -history of autonomic insufficiency - polypharmacy is likely exacerbating this , asked to discuss with her pcp streamlining her medications.Particularly her zanaflex. She is also on benzos, opiates, imdur, ditropan  She is reluctant to lower or adjust imdur. With recent HTN she is not willing to adjust dose of her florinef - given severe HTN we will increase her prn coreg to 6.25mg  q6hrs prn.  - refer to [facility] autonomic clinic, provider neccesary documentation that she requires this appointment so she will be able to get transportation.       Antoine Poche, M.D.

## 2018-05-24 NOTE — Telephone Encounter (Signed)
I called the patient.  EEG study was normal, MRI of the brain is pending. 

## 2018-05-26 ENCOUNTER — Encounter: Payer: Self-pay | Admitting: *Deleted

## 2018-05-26 ENCOUNTER — Telehealth: Payer: Self-pay | Admitting: *Deleted

## 2018-05-26 NOTE — Telephone Encounter (Signed)
-----   Message from Antoine Poche, MD sent at 05/26/2018  9:05 AM EST ----- Can you provide this as a letter for this patient. She needs sent to [facility] to be able to get transporatation to the autonomic dysfunction clinic we referred her to at [facility]. Please coordinate with her and [facility] where we need to fax this.     To whom it may concern, Mrs [name] is followed at our cardiology offices in [place], [place]. Due to a severe cardiac condition she requires further evaluation at a speciality clinic affiliated with [facility] located in [place], [place] with the information listed below. Please arrange any neccesary transportation so she may make the appiontment  Paulla Dolly, MD  [address]  [address]  [place], [place] [address]  [phone]  [phone] (Fax)    Sincerely,  Dr Dina Rich

## 2018-05-26 NOTE — Telephone Encounter (Signed)
Spoke with [facility] for correct fax number - letter faxed and pt made aware

## 2018-05-28 ENCOUNTER — Encounter: Payer: Self-pay | Admitting: Nurse Practitioner

## 2018-05-28 ENCOUNTER — Ambulatory Visit (INDEPENDENT_AMBULATORY_CARE_PROVIDER_SITE_OTHER): Payer: 59 | Admitting: Nurse Practitioner

## 2018-05-28 VITALS — BP 148/93 | HR 94 | Temp 98.3°F | Ht 61.0 in | Wt 151.6 lb

## 2018-05-28 DIAGNOSIS — R05 Cough: Secondary | ICD-10-CM | POA: Diagnosis not present

## 2018-05-28 DIAGNOSIS — R059 Cough, unspecified: Secondary | ICD-10-CM

## 2018-05-28 MED ORDER — PREDNISONE 10 MG (21) PO TBPK: ORAL_TABLET | ORAL | 0 refills | Status: DC

## 2018-05-28 MED ORDER — FLUTICASONE-UMECLIDIN-VILANT 100-62.5-25 MCG/INH IN AEPB: 1.0000 | INHALATION_SPRAY | Freq: Every day | RESPIRATORY_TRACT | 3 refills | Status: DC

## 2018-05-28 NOTE — Progress Notes (Signed)
   Subjective:    Patient ID: [name], female    DOB: March 21, 1956, 63 y.o.   MRN: [id number]   Chief Complaint: Cough   HPI Patient comes in today c/o cough. Started 4-6 weeks ago. Has been to er and got steroid and antibiotics. She then saw C. Hawks and was given steroids again and antibiotics. She says she is no better . Has had to use nebulizer 2-3 x a day for 2 weeks. Hydrocodone is not helping cough.  * chest xray on 05/17/18- minimal bibasilar atelectasis  Review of Systems  Constitutional: Negative for chills and fever.  HENT: Negative for congestion, sinus pressure, sore throat and trouble swallowing.   Respiratory: Positive for cough. Negative for shortness of breath.   Gastrointestinal: Negative.   Genitourinary: Negative.   Musculoskeletal: Negative.   Neurological: Negative for headaches.  Psychiatric/Behavioral: Negative.   All other systems reviewed and are negative.      Objective:   Physical Exam Vitals signs and nursing note reviewed.  Constitutional:      General: She is in acute distress (mild).     Appearance: She is normal weight.  Cardiovascular:     Rate and Rhythm: Normal rate and regular rhythm.     Heart sounds: Normal heart sounds.  Pulmonary:     Effort: Pulmonary effort is normal.     Breath sounds: Normal breath sounds.  Abdominal:     General: Abdomen is flat.     Palpations: Abdomen is soft.  Skin:    General: Skin is warm and dry.  Neurological:     General: No focal deficit present.     Mental Status: She is alert and oriented to person, place, and time.  Psychiatric:        Mood and Affect: Mood normal.        Behavior: Behavior normal.    BP (!) 148/93   Pulse 94   Temp 98.3 F (36.8 C) (Oral)   Ht 5\' 1"  (1.549 m)   Wt 151 lb 9.6 oz (68.8 kg)   SpO2 (!) 89% Comment: room air  BMI 28.64 kg/m      Assessment & Plan:  [name] in today with chief complaint of Cough   1. Cough Force  fluids Rest Humidifier If no improvement will refer to pulmonology - Fluticasone-Umeclidin-Vilant (TRELEGY ELLIPTA) 100-62.5-25 MCG/INH AEPB; Inhale 1 puff into the lungs daily.  Dispense: 1 each; Refill: 3 - predniSONE (STERAPRED UNI-PAK 21 TAB) 10 MG (21) TBPK tablet; As directed x 6 days  Dispense: 21 tablet; Refill: 0  [name] Daphine Deutscher, FNP

## 2018-05-28 NOTE — Patient Instructions (Signed)

## 2018-06-01 ENCOUNTER — Other Ambulatory Visit: Payer: Self-pay | Admitting: Nurse Practitioner

## 2018-06-01 DIAGNOSIS — B009 Herpesviral infection, unspecified: Secondary | ICD-10-CM

## 2018-06-01 DIAGNOSIS — K2100 Gastro-esophageal reflux disease with esophagitis, without bleeding: Secondary | ICD-10-CM

## 2018-06-15 ENCOUNTER — Other Ambulatory Visit: Payer: Self-pay

## 2018-06-15 ENCOUNTER — Ambulatory Visit (INDEPENDENT_AMBULATORY_CARE_PROVIDER_SITE_OTHER): Payer: 59 | Admitting: Nurse Practitioner

## 2018-06-15 ENCOUNTER — Encounter: Payer: Self-pay | Admitting: Nurse Practitioner

## 2018-06-15 VITALS — BP 159/93 | HR 84 | Temp 97.8°F | Ht 61.0 in | Wt 147.0 lb

## 2018-06-15 DIAGNOSIS — E785 Hyperlipidemia, unspecified: Secondary | ICD-10-CM

## 2018-06-15 DIAGNOSIS — M47812 Spondylosis without myelopathy or radiculopathy, cervical region: Secondary | ICD-10-CM

## 2018-06-15 DIAGNOSIS — M810 Age-related osteoporosis without current pathological fracture: Secondary | ICD-10-CM

## 2018-06-15 DIAGNOSIS — M25511 Pain in right shoulder: Secondary | ICD-10-CM

## 2018-06-15 DIAGNOSIS — I10 Essential (primary) hypertension: Secondary | ICD-10-CM | POA: Diagnosis not present

## 2018-06-15 DIAGNOSIS — F331 Major depressive disorder, recurrent, moderate: Secondary | ICD-10-CM

## 2018-06-15 DIAGNOSIS — M47817 Spondylosis without myelopathy or radiculopathy, lumbosacral region: Secondary | ICD-10-CM

## 2018-06-15 DIAGNOSIS — R05 Cough: Secondary | ICD-10-CM

## 2018-06-15 DIAGNOSIS — F3341 Major depressive disorder, recurrent, in partial remission: Secondary | ICD-10-CM

## 2018-06-15 DIAGNOSIS — K2100 Gastro-esophageal reflux disease with esophagitis, without bleeding: Secondary | ICD-10-CM

## 2018-06-15 DIAGNOSIS — R059 Cough, unspecified: Secondary | ICD-10-CM

## 2018-06-15 DIAGNOSIS — I200 Unstable angina: Secondary | ICD-10-CM

## 2018-06-15 DIAGNOSIS — F5101 Primary insomnia: Secondary | ICD-10-CM

## 2018-06-15 DIAGNOSIS — R569 Unspecified convulsions: Secondary | ICD-10-CM

## 2018-06-15 DIAGNOSIS — R7303 Prediabetes: Secondary | ICD-10-CM

## 2018-06-15 DIAGNOSIS — F411 Generalized anxiety disorder: Secondary | ICD-10-CM

## 2018-06-15 DIAGNOSIS — G44229 Chronic tension-type headache, not intractable: Secondary | ICD-10-CM

## 2018-06-15 LAB — BAYER DCA HB A1C WAIVED: HB A1C (BAYER DCA - WAIVED): 6.3 % (ref <7.0)

## 2018-06-15 MED ORDER — ISOSORBIDE MONONITRATE ER 30 MG PO TB24: 30.0000 mg | ORAL_TABLET | Freq: Every day | ORAL | 6 refills | Status: DC

## 2018-06-15 MED ORDER — BUSPIRONE HCL 10 MG PO TABS: ORAL_TABLET | ORAL | 5 refills | Status: DC

## 2018-06-15 MED ORDER — ONDANSETRON HCL 4 MG PO TABS: 4.0000 mg | ORAL_TABLET | Freq: Three times a day (TID) | ORAL | 0 refills | Status: DC | PRN

## 2018-06-15 MED ORDER — CARVEDILOL 6.25 MG PO TABS: 6.2500 mg | ORAL_TABLET | Freq: Four times a day (QID) | ORAL | 3 refills | Status: DC | PRN

## 2018-06-15 MED ORDER — DIAZEPAM 5 MG PO TABS: ORAL_TABLET | ORAL | 2 refills | Status: DC

## 2018-06-15 MED ORDER — FUROSEMIDE 40 MG PO TABS: ORAL_TABLET | ORAL | 1 refills | Status: DC

## 2018-06-15 MED ORDER — DEXLANSOPRAZOLE 60 MG PO CPDR: 1.0000 | DELAYED_RELEASE_CAPSULE | Freq: Every day | ORAL | 5 refills | Status: DC

## 2018-06-15 MED ORDER — ESCITALOPRAM OXALATE 20 MG PO TABS: ORAL_TABLET | ORAL | 5 refills | Status: DC

## 2018-06-15 MED ORDER — TRAZODONE HCL 150 MG PO TABS: 150.0000 mg | ORAL_TABLET | Freq: Every day | ORAL | 2 refills | Status: DC

## 2018-06-15 MED ORDER — PREDNISONE 10 MG (21) PO TBPK: ORAL_TABLET | ORAL | 0 refills | Status: DC

## 2018-06-15 MED ORDER — LEVETIRACETAM 500 MG PO TABS: ORAL_TABLET | ORAL | 5 refills | Status: DC

## 2018-06-15 MED ORDER — BUTALBITAL-APAP-CAFFEINE 50-325-40 MG PO TABS: 1.0000 | ORAL_TABLET | Freq: Four times a day (QID) | ORAL | 0 refills | Status: DC | PRN

## 2018-06-15 MED ORDER — RANITIDINE HCL 150 MG PO TABS: 150.0000 mg | ORAL_TABLET | Freq: Every day | ORAL | 5 refills | Status: DC

## 2018-06-15 MED ORDER — HYDROCODONE-ACETAMINOPHEN 7.5-325 MG PO TABS: 1.0000 | ORAL_TABLET | Freq: Two times a day (BID) | ORAL | 0 refills | Status: AC

## 2018-06-15 MED ORDER — DULOXETINE HCL 60 MG PO CPEP: 60.0000 mg | ORAL_CAPSULE | Freq: Every day | ORAL | 5 refills | Status: DC

## 2018-06-15 NOTE — Progress Notes (Signed)
Subjective:    Patient ID: [name], female    DOB: 08/27/1955, 63 y.o.   MRN: [id number]   Chief Complaint: medical management of chronic issues  HPI:  1. Essential hypertension  No c/o chest pain, sob or headache. Does not check blood pressure at home anymore. BP Readings from Last 3 Encounters:  05/28/18 (!) 148/93  05/24/18 (!) 171/106  05/17/18 (!) 164/99     2. Hyperlipidemia with target LDL less than 100  does not watch diet and does no exercise  3. Pre-diabetes  Last hgba1c was 5.8%. she does not check her blood sugars at home.  4. Gastroesophageal reflux disease with esophagitis  patient is on dexilant daily- works well to keep symptoms under control  5. Primary insomnia  Takes trazadone to sleep at night- works well most nights  6. Recurrent major depressive disorder, in partial remission (HCC)  Is on lexapro and is doing well.  7. GAD (generalized anxiety disorder)  Is on buspar BID. Some days she is good and other days she is not.  8. Seizures (HCC)   no recent seizure activity  9. Age-related osteoporosis without current pathological fracture  Last dexascan was done 03/2017 with t score of -.07  10.    Lumbar spondylosis          Pain assessment: Cause of pain- radiculopathy Pain location- lumbar spine and neck Pain on scale of 1-10- 6/10 currenlty Frequency- most days What increases pain-lots of activity What makes pain Better-rest Effects on ADL - she gets done what she needs to. Any change in general medical condition-none  Current opioids rx- hydrocodone 5/325 but does not take everyday # meds rx- 60 Effectiveness of current meds-brings pain down to 2/10 most of time Adverse reactions form pain meds-none Morphine equivalent-  Pill count performed-No Last drug screen - 11/1717 ( high risk q68m, moderate risk q20m, low risk yearly ) Urine drug screen today- No Was the [facility] reviewed- yes  If yes were their any concerning findings? -  no     Pain contract signed on: 12/24/17  Outpatient Encounter Medications as of 06/15/2018  Medication Sig  . acyclovir ointment (ZOVIRAX) 5 % APPLY TO AFFECTED AREA EVERY 3 HOURS  . albuterol (PROVENTIL HFA;VENTOLIN HFA) 108 (90 Base) MCG/ACT inhaler INHALE 2 PUFFS EVERY 6 HOURS AS NEEDED FOR SHORTNESS OF BREATH AND WHEEZING.  [name] albuterol (PROVENTIL) (2.5 MG/3ML) 0.083% nebulizer solution USE 1 VIAL IN NEBULIZER EVERY 4 HOURS AS NEEDED FOR WHEEZING.  [name] Alcohol Swabs (ALCOHOL PADS) 70 % PADS USE 1 PAD DAILY WHEN CHECKING BLOOD SUGAR.  [name] ALLERGY 10 MG tablet TAKE 1 TABLET ONCE DAILY.  . busPIRone (BUSPAR) 10 MG tablet 1 po bid prn  . butalbital-acetaminophen-caffeine (FIORICET, ESGIC) 50-325-40 MG tablet TAKE 1 TABLET TWICE DAILY AS NEEDED FOR HEADACHE.  . carvedilol (COREG) 6.25 MG tablet Take 1 tablet (6.25 mg total) by mouth 4 (four) times daily as needed.  [name] DEXILANT 60 MG capsule TAKE 1 CAPSULE DAILY.  . diazepam (VALIUM) 5 MG tablet 1 po bid  . DULoxetine (CYMBALTA) 60 MG capsule Take 1 capsule (60 mg total) by mouth at bedtime.  [name] escitalopram (LEXAPRO) 20 MG tablet TAKE (1) TABLET BY MOUTH ONCE DAILY.  [name] estradiol (ESTRACE) 0.1 MG/GM vaginal cream PLACE 1 APPLICATORFUL VAGINALLY AT BEDTIME.  . fludrocortisone (FLORINEF) 0.1 MG tablet TAKE 2 TABLETS BY MOUTH ONCE DAILY.  . fluticasone (FLONASE) 50 MCG/ACT nasal spray Place 2 sprays into both  nostrils daily.  . Fluticasone-Umeclidin-Vilant (TRELEGY ELLIPTA) 100-62.5-25 MCG/INH AEPB Inhale 1 puff into the lungs daily.  . furosemide (LASIX) 40 MG tablet TAKE 1 TABLET AS NEEDED FOR SWELLING  . glucose blood (ONETOUCH VERIO) test strip Test blood sugar daily  . ipratropium (ATROVENT) 0.02 % nebulizer solution USE 1 VIAL IN NEBULIZER EVERY 4 HOURS AS NEEDED FOR WHEEZING.  . isosorbide mononitrate (IMDUR) 30 MG 24 hr tablet Take 1 tablet (30 mg total) by mouth daily.  [name] lamoTRIgine (LAMICTAL) 150 MG tablet TAKE 1 TABLET BY MOUTH ONCE  DAILY.  [name] levETIRAcetam (KEPPRA) 500 MG tablet TAKE (1) TABLET TWICE DAILY.  . nitroGLYCERIN (NITROSTAT) 0.4 MG SL tablet PLACE ONE (1) TABLET UNDER TONGUE EVERY 5 MINUTES UP TO (3) DOSES AS NEEDED FOR CHEST PAIN.  [name] ondansetron (ZOFRAN) 4 MG tablet TAKE 1 TO 2 TABLETS AS NEEDED FOR NAUSEA.  [name] DELICA LANCETS 33G MISC TEST BLOOD SUGAR ONCE DAILY.  [name] oxybutynin (DITROPAN) 5 MG tablet TAKE (1) TABLET TWICE DAILY.  . ranitidine (ZANTAC) 150 MG tablet TAKE 1 TABLET BY MOUTH AT BEDTIME.  . rosuvastatin (CRESTOR) 10 MG tablet TAKE (1) TABLET BY MOUTH ONCE DAILY.  [name] sucralfate (CARAFATE) 1 GM/10ML suspension Take 10 mLs (1 g total) by mouth 2 (two) times daily.  [name] tiZANidine (ZANAFLEX) 4 MG tablet TAKE (1) TABLET EVERY SIX HOURS AS NEEDED FOR MUSCLE SPASMS.  [name] tolterodine (DETROL) 2 MG tablet TAKE (1) TABLET TWICE DAILY.  . traZODone (DESYREL) 150 MG tablet TAKE 1 TABLET BY MOUTH AT BEDTIME.  . valACYclovir (VALTREX) 500 MG tablet TAKE (1) TABLET TWICE DAILY.       New complaints: Right shoulder pain. Hurts in joint and is painful to lay on.   Social history: Lives by herself   Review of Systems  Constitutional: Negative for activity change and appetite change.  HENT: Negative.   Eyes: Negative for pain.  Respiratory: Negative for shortness of breath.   Cardiovascular: Negative for chest pain, palpitations and leg swelling.  Gastrointestinal: Negative for abdominal pain.  Endocrine: Negative for polydipsia.  Genitourinary: Negative.   Skin: Negative for rash.  Neurological: Negative for dizziness, weakness and headaches.  Hematological: Does not bruise/bleed easily.  Psychiatric/Behavioral: Negative.   All other systems reviewed and are negative.      Objective:   Physical Exam Vitals signs and nursing note reviewed.  Constitutional:      General: She is not in acute distress.    Appearance: Normal appearance. She is well-developed.  HENT:     Head: Normocephalic.      Nose: Nose normal.  Eyes:     Pupils: Pupils are equal, round, and reactive to light.  Neck:     Musculoskeletal: Normal range of motion and neck supple.     Vascular: No carotid bruit or JVD.  Cardiovascular:     Rate and Rhythm: Normal rate and regular rhythm.     Heart sounds: Normal heart sounds.  Pulmonary:     Effort: Pulmonary effort is normal. No respiratory distress.     Breath sounds: Normal breath sounds. No wheezing or rales.  Chest:     Chest wall: No tenderness.  Abdominal:     General: Bowel sounds are normal. There is no distension or abdominal bruit.     Palpations: Abdomen is soft. There is no hepatomegaly, splenomegaly, mass or pulsatile mass.     Tenderness: There is no abdominal tenderness.  Musculoskeletal: Normal range of motion.  Comments: Decrease ROM of right shoulder due to pain on ext and internal rotataion.  Lymphadenopathy:     Cervical: No cervical adenopathy.  Skin:    General: Skin is warm and dry.  Neurological:     Mental Status: She is alert and oriented to person, place, and time.     Deep Tendon Reflexes: Reflexes are normal and symmetric.  Psychiatric:        Behavior: Behavior normal.        Thought Content: Thought content normal.        Judgment: Judgment normal.    BP (!) 159/93   Pulse 84   Temp 97.8 F (36.6 C) (Oral)   Ht  (1.549 m)   Wt 147 lb (66.7 kg)   BMI 27.78 kg/m        Assessment & Plan:  [name] comes in today with chief complaint of Medical Management of Chronic Issues (Still has cough and wants shot in right shoulder)   Diagnosis and orders addressed:  1. Essential hypertension Low sodium diet - furosemide (LASIX) 40 MG tablet; TAKE 1 TABLET AS NEEDED FOR SWELLING  Dispense: 90 tablet; Refill: 1 - carvedilol (COREG) 6.25 MG tablet; Take 1 tablet (6.25 mg total) by mouth 4 (four) times daily as needed.  Dispense: 120 tablet; Refill: 3  2. Hyperlipidemia with target LDL less than 100  Low fat diet  3. Pre-diabetes  4. Gastroesophageal reflux disease with esophagitis Avoid spicy foods Do not eat 2 hours prior to bedtime - ranitidine (ZANTAC) 150 MG tablet; Take 1 tablet (150 mg total) by mouth at bedtime.  Dispense: 30 tablet; Refill: 5 - dexlansoprazole (DEXILANT) 60 MG capsule; Take 1 capsule (60 mg total) by mouth daily.  Dispense: 30 capsule; Refill: 5  5. Primary insomnia Bedtime routine - traZODone (DESYREL) 150 MG tablet; Take 1 tablet (150 mg total) by mouth at bedtime.  Dispense: 30 tablet; Refill: 2  6. Recurrent major depressive disorder, in partial remission (HCC) Stress management  7. GAD (generalized anxiety disorder)  8. Seizures (HCC) - levETIRAcetam (KEPPRA) 500 MG tablet; TAKE (1) TABLET TWICE DAILY.  Dispense: 60 tablet; Refill: 5  9. Age-related osteoporosis without current pathological fracture Weight bearing exercise4s  10. Lumbosacral spondylosis without myelopathy Moist heat to back Back stretches discussed - HYDROcodone-acetaminophen (NORCO) 7.5-325 MG tablet; Take 1 tablet by mouth 2 (two) times daily for 30 days.  Dispense: 60 tablet; Refill: 0  11. Cough Continue nebs mucinex OTC Watch blood sugars while on prednisone - predniSONE (STERAPRED UNI-PAK 21 TAB) 10 MG (21) TBPK tablet; As directed x 6 days  Dispense: 21 tablet; Refill: 0  12. Acute pain of right shoulder rest - predniSONE (STERAPRED UNI-PAK 21 TAB) 10 MG (21) TBPK tablet; As directed x 6 days  Dispense: 21 tablet; Refill: 0  13. Anxiety state - busPIRone (BUSPAR) 10 MG tablet; 1 po bid prn  Dispense: 60 tablet; Refill: 5  14. Cervical spondylosis without myelopathy - diazepam (VALIUM) 5 MG tablet; 1 po bid  Dispense: 60 tablet; Refill: 2  15. Moderate episode of recurrent major depressive disorder (HCC) - DULoxetine (CYMBALTA) 60 MG capsule; Take 1 capsule (60 mg total) by mouth at bedtime.  Dispense: 30 capsule; Refill: 5 - escitalopram (LEXAPRO) 20 MG  tablet; TAKE (1) TABLET BY MOUTH ONCE DAILY.  Dispense: 30 tablet; Refill: 5  16. Unstable angina (HCC) - isosorbide mononitrate (IMDUR) 30 MG 24 hr tablet; Take 1 tablet (30 mg total) by  mouth daily.  Dispense: 30 tablet; Refill: 6   Labs pending Health Maintenance reviewed Diet and exercise encouraged  Follow up plan: 3 months   [name] Daphine DeutscherMartin, FNP

## 2018-06-15 NOTE — Addendum Note (Signed)
Addended by: Bennie Pierini on: 06/15/2018 12:19 PM   Modules accepted: Orders

## 2018-06-16 ENCOUNTER — Telehealth: Payer: Self-pay | Admitting: Cardiology

## 2018-06-16 LAB — CMP14+EGFR
ALT: 14 IU/L (ref 0–32)
AST: 18 IU/L (ref 0–40)
Albumin/Globulin Ratio: 1.4 (ref 1.2–2.2)
Albumin: 3.8 g/dL (ref 3.8–4.8)
Alkaline Phosphatase: 91 IU/L (ref 39–117)
BUN/Creatinine Ratio: 13 (ref 12–28)
BUN: 10 mg/dL (ref 8–27)
Bilirubin Total: <0.2 mg/dL (ref 0.0–1.2)
CHLORIDE: 102 mmol/L (ref 96–106)
CO2: 25 mmol/L (ref 20–29)
CREATININE: 0.8 mg/dL (ref 0.57–1.00)
Calcium: 9.2 mg/dL (ref 8.7–10.3)
GFR calc Af Amer: 92 mL/min/{1.73_m2} (ref >59)
GFR, EST NON AFRICAN AMERICAN: 80 mL/min/{1.73_m2} (ref >59)
GLUCOSE: 101 mg/dL — AB (ref 65–99)
Globulin, Total: 2.7 g/dL (ref 1.5–4.5)
Potassium: 4.3 mmol/L (ref 3.5–5.2)
Sodium: 144 mmol/L (ref 134–144)
Total Protein: 6.5 g/dL (ref 6.0–8.5)

## 2018-06-16 LAB — LIPID PANEL
Chol/HDL Ratio: 3.4 ratio (ref 0.0–4.4)
Cholesterol, Total: 161 mg/dL (ref 100–199)
HDL: 47 mg/dL (ref >39)
LDL Calculated: 94 mg/dL (ref 0–99)
Triglycerides: 101 mg/dL (ref 0–149)
VLDL Cholesterol Cal: 20 mg/dL (ref 5–40)

## 2018-06-16 LAB — THYROID PANEL WITH TSH
Free Thyroxine Index: 1.7 (ref 1.2–4.9)
T3 Uptake Ratio: 23 % — ABNORMAL LOW (ref 24–39)
T4, Total: 7.5 ug/dL (ref 4.5–12.0)
TSH: 2.56 u[IU]/mL (ref 0.450–4.500)

## 2018-06-16 NOTE — Telephone Encounter (Signed)
Patient called stating that she was seen today at [facility]- She would like to speak with someone in regards to this visit.

## 2018-06-17 ENCOUNTER — Telehealth: Payer: Self-pay | Admitting: Nurse Practitioner

## 2018-06-17 NOTE — Telephone Encounter (Signed)
If no fever no need for antibiotic unless she is swabbed and test positive for strep.

## 2018-06-17 NOTE — Telephone Encounter (Signed)
Pt was given My chart phone number so they could walk her through process

## 2018-06-17 NOTE — Telephone Encounter (Signed)
Pt states she said you have her quarantine  for 2 weeks. She said that you would call something in for her if she needed anything. Pt advised of what you stated she cant come in if you have her quarantine. Please advise?

## 2018-06-17 NOTE — Telephone Encounter (Signed)
Tell her she will need to do an e visit. I did not quarantine her I just told her she needed to saty away because of her medicalproblems.

## 2018-06-18 ENCOUNTER — Encounter: Payer: Self-pay | Admitting: *Deleted

## 2018-06-18 NOTE — Telephone Encounter (Signed)
[facility] notes requested.  

## 2018-06-22 NOTE — Telephone Encounter (Signed)
Pt wasn't seen at [facility] was seen by [facility] DR Lanae Boast and was to have a procedure however she is going to postpone this to limit exposure to COVID 19 - pt scheduled with [name] in June - pt will contact Dr Lanae Boast office to confirm this as I explained I could not see that she was scheduled in our system I could only see notes in care everywhere - pt voiced understanding and appreciative

## 2018-06-30 ENCOUNTER — Other Ambulatory Visit: Payer: Self-pay | Admitting: Nurse Practitioner

## 2018-06-30 NOTE — Telephone Encounter (Signed)
Last seen 06/15/18

## 2018-07-02 ENCOUNTER — Encounter: Payer: 59 | Admitting: *Deleted

## 2018-07-12 ENCOUNTER — Other Ambulatory Visit: Payer: Self-pay

## 2018-07-12 ENCOUNTER — Ambulatory Visit (INDEPENDENT_AMBULATORY_CARE_PROVIDER_SITE_OTHER): Payer: 59 | Admitting: Nurse Practitioner

## 2018-07-12 ENCOUNTER — Other Ambulatory Visit: Payer: Self-pay | Admitting: Nurse Practitioner

## 2018-07-12 ENCOUNTER — Other Ambulatory Visit: Payer: Self-pay | Admitting: Family Medicine

## 2018-07-12 ENCOUNTER — Encounter: Payer: Self-pay | Admitting: Nurse Practitioner

## 2018-07-12 DIAGNOSIS — B9689 Other specified bacterial agents as the cause of diseases classified elsewhere: Secondary | ICD-10-CM | POA: Diagnosis not present

## 2018-07-12 DIAGNOSIS — N760 Acute vaginitis: Secondary | ICD-10-CM | POA: Diagnosis not present

## 2018-07-12 DIAGNOSIS — G44229 Chronic tension-type headache, not intractable: Secondary | ICD-10-CM

## 2018-07-12 DIAGNOSIS — N941 Unspecified dyspareunia: Secondary | ICD-10-CM

## 2018-07-12 MED ORDER — METRONIDAZOLE 500 MG PO TABS: 500.0000 mg | ORAL_TABLET | Freq: Two times a day (BID) | ORAL | 0 refills | Status: DC

## 2018-07-12 NOTE — Telephone Encounter (Signed)
Scheduled patient for telephone visit with MMM today.

## 2018-07-12 NOTE — Progress Notes (Signed)
Patient ID: [name], female   DOB: [birth year], 63 y.o.   MRN: [id number]    Virtual Visit via telephone Note  I connected with [name] on 07/12/18 at 3:30 PM by telephone and verified that I am speaking with the correct person using two identifiers. [name] is currently located at home and no one is currently with her during visit. The provider, [name] Daphine Deutscher, FNP is located in their office at time of visit.  I discussed the limitations, risks, security and privacy concerns of performing an evaluation and management service by telephone and the availability of in person appointments. I also discussed with the patient that there may be a patient responsible charge related to this service. The patient expressed understanding and agreed to proceed.   History and Present Illness:   Chief Complaint: Vaginal Discharge   HPI Patient calls in today c/o vaginal discharge. She says it started 3-4 days ago. Has slight fishy odor. Denies any itching. Denies new sexual partners     Review of Systems  Constitutional: Negative.   HENT: Negative.   Respiratory: Negative.   Cardiovascular: Negative.   Genitourinary: Negative.  Negative for dysuria, frequency and urgency.  Psychiatric/Behavioral: Negative.   All other systems reviewed and are negative.    Observations/Objective: Alert and oriented- answers all questions appropriately  Assessment and Plan: [name] in today with chief complaint of Vaginal Discharge   1. Bacterial vaginosis Meds ordered this encounter  Medications  . metroNIDAZOLE (FLAGYL) 500 MG tablet    Sig: Take 1 tablet (500 mg total) by mouth 2 (two) times daily.    Dispense:  14 tablet    Refill:  0    Order Specific Question:   Supervising Provider    Answer:   Arville Care A [[id number]]   No douching No bubble baths   Follow Up Instructions: prn    I discussed the assessment and treatment plan with the  patient. The patient was provided an opportunity to ask questions and all were answered. The patient agreed with the plan and demonstrated an understanding of the instructions.   The patient was advised to call back or seek an in-person evaluation if the symptoms worsen or if the condition fails to improve as anticipated.  The above assessment and management plan was discussed with the patient. The patient verbalized understanding of and has agreed to the management plan. Patient is aware to call the clinic if symptoms persist or worsen. Patient is aware when to return to the clinic for a follow-up visit. Patient educated on when it is appropriate to go to the emergency department.    I provided 7 minutes of non-face-to-face time during this encounter.    [name] Daphine Deutscher, FNP

## 2018-07-16 ENCOUNTER — Other Ambulatory Visit: Payer: Self-pay | Admitting: Nurse Practitioner

## 2018-07-16 ENCOUNTER — Telehealth: Payer: Self-pay | Admitting: Nurse Practitioner

## 2018-07-16 DIAGNOSIS — M47817 Spondylosis without myelopathy or radiculopathy, lumbosacral region: Secondary | ICD-10-CM

## 2018-07-19 ENCOUNTER — Ambulatory Visit (INDEPENDENT_AMBULATORY_CARE_PROVIDER_SITE_OTHER): Payer: Self-pay

## 2018-07-19 ENCOUNTER — Encounter: Payer: Self-pay | Admitting: Family Medicine

## 2018-07-19 ENCOUNTER — Encounter (INDEPENDENT_AMBULATORY_CARE_PROVIDER_SITE_OTHER): Payer: Self-pay | Admitting: Orthopaedic Surgery

## 2018-07-19 ENCOUNTER — Ambulatory Visit (INDEPENDENT_AMBULATORY_CARE_PROVIDER_SITE_OTHER): Payer: 59

## 2018-07-19 ENCOUNTER — Ambulatory Visit (INDEPENDENT_AMBULATORY_CARE_PROVIDER_SITE_OTHER): Payer: 59 | Admitting: Family Medicine

## 2018-07-19 ENCOUNTER — Ambulatory Visit (INDEPENDENT_AMBULATORY_CARE_PROVIDER_SITE_OTHER): Payer: 59 | Admitting: Orthopaedic Surgery

## 2018-07-19 ENCOUNTER — Other Ambulatory Visit: Payer: Self-pay

## 2018-07-19 VITALS — BP 138/94 | HR 80 | Temp 98.0°F | Ht 61.0 in | Wt 148.0 lb

## 2018-07-19 DIAGNOSIS — M25511 Pain in right shoulder: Secondary | ICD-10-CM | POA: Insufficient documentation

## 2018-07-19 MED ORDER — METHYLPREDNISOLONE ACETATE 40 MG/ML IJ SUSP: 40.0000 mg | Freq: Once | INTRAMUSCULAR | Status: DC

## 2018-07-19 MED ORDER — OXYCODONE-ACETAMINOPHEN 5-325 MG PO TABS: 1.0000 | ORAL_TABLET | Freq: Three times a day (TID) | ORAL | 0 refills | Status: DC | PRN

## 2018-07-19 NOTE — Telephone Encounter (Signed)
Pt came in today to see Dr Darlyn Read for continued right shoulder pain - xray done and referral to ortho   She states that MMM only gave 1 mo of pain meds last time and that she usually does 3 mos.   Please address - pt willing to come in if needed - she is out and needs asap

## 2018-07-19 NOTE — Progress Notes (Signed)
Office Visit Note   Patient: Erica NielsenRosemary B [name]           Date of Birth: 09-30-1955           MRN: [id number] Visit Date: 07/19/2018              Requested by: Bennie PieriniMartin, Elias Bordner-Margaret, FNP [address] [place], [place] [id number] PCP: Bennie PieriniMartin, [name], FNP   Assessment & Plan: Visit Diagnoses:  1. Acute pain of right shoulder     Plan: Impression is right shoulder glenohumeral osteoarthritis flareup.  We will refer the patient to Dr. Prince Romehilts for an ultrasound guided cortisone injection.  She will follow-up with us as needed.  Follow-Up Instructions: Return if symptoms worsen or fail to improve.   Orders:  Orders Placed This Encounter  Procedures  . XR Shoulder Right   No orders of the defined types were placed in this encounter.     Procedures: No procedures performed   Clinical Data: No additional findings.   Subjective: Chief Complaint  Patient presents with  . Right Shoulder - Dislocation    HPI patient is a pleasant 63 year old right-hand-dominant female who presents our clinic today with right shoulder pain.  This began approximately 1 month ago when her dog jumped off of her lap pushing off with his back legs against her right arm/shoulder.  She has had pain to the entire shoulder since.  She describes this as a constant pain worse with any range of motion about the shoulder.  She was initially seen by her PCP where she was given prednisone.  She noticed no relief in symptoms.  She denies any numbness, tingling or burning down the right upper extremity.  She was seen earlier today where x-rays were obtained.  These were negative for fracture or dislocation.  She comes in today for further evaluation treatment recommendation. Review of Systems as detailed in HPI.  All others reviewed and are negative.   Objective: Vital Signs: There were no vitals taken for this visit.  Physical Exam well-developed well-nourished female in no acute distress.  Alert and  oriented x3.  Ortho Exam examination of her right shoulder reveals marked tenderness throughout.  Very limited range of motion secondary to pain.  She is neurovascularly intact distally.  Specialty Comments:  No specialty comments available.  Imaging: Dg Shoulder Right  Result Date: 07/19/2018 CLINICAL DATA:  Pain following fall EXAM: RIGHT SHOULDER - 2+ VIEW COMPARISON:  July 05, 2013 FINDINGS: Internal rotation, external rotation, and Y scapular images were obtained. No acute fracture or dislocation. There is osteoarthritic change in the glenohumeral joint. The acromioclavicular joint appears unremarkable. No erosive change. Visualized right lung is clear. IMPRESSION: Relatively mild osteoarthritic change in the glenohumeral joint. Acromioclavicular joint appears within normal limits. No fracture or dislocation. Electronically Signed   By: Bretta BangWilliam  Woodruff III M.D.   On: 07/19/2018 08:55   Xr Shoulder Right  Result Date: 07/19/2018 No acute or structural abnormalities    PMFS History: Patient Active Problem List   Diagnosis Date Noted  . Acute pain of right shoulder 07/19/2018  . Unstable angina (HCC) 10/20/2017  . Encounter for chronic pain management 09/23/2016  . Pre-diabetes 09/02/2016  . Recurrent major depressive disorder, in partial remission (HCC) 07/14/2016  . Seizures (HCC) 07/14/2016  . GAD (generalized anxiety disorder) 07/14/2016  . Insomnia 09/05/2014  . Allergic rhinitis 09/05/2014  . Cervical spondylosis without myelopathy 12/19/2013    Class: Chronic  . Neural foraminal stenosis of cervical spine  12/19/2013  . Cervical radiculitis 09/19/2013  . Lumbosacral spondylosis without myelopathy 11/16/2012  . Postlaminectomy syndrome, lumbar region 11/16/2012  . Herpes simplex virus (HSV) infection 10/31/2008  . Hyperlipidemia with target LDL less than 100 10/31/2008  . Essential hypertension 10/31/2008  . GERD 10/31/2008  . SPINAL STENOSIS OF LUMBAR REGION  10/31/2008  . Myalgia 10/31/2008  . Osteoporosis 10/31/2008  . SPONDYLOLISTHESIS 10/31/2008  . SYNCOPE 10/31/2008  . Sleep apnea 10/31/2008   Past Medical History:  Diagnosis Date  . Anginal pain (HCC)    last time   . Anxiety   . Arthritis    RHEUMATOID  . Asthma   . Bipolar 1 disorder (HCC)   . Cataracts, bilateral 07/2017  . COPD (chronic obstructive pulmonary disease) (HCC)   . Coronary artery disease    reported hx of "MI";  Echo 2009 with normal LVF;  Myoview 05/2011: no ischemia  . Depression   . Dyslipidemia   . Dysrhythmia    SVT  . Esophageal stricture   . Fibromyalgia   . GERD (gastroesophageal reflux disease)   . H/O hiatal hernia   . Head injury, unspecified   . Herpes simplex infection   . History of kidney stones   . Hyperlipidemia   . Hypertension   . Insomnia   . Myocardial infarction [facility])    age 34  . Osteoporosis   . Pneumonia    hx  . Seizures (HCC)    last 3 weeks ago- prescribed keppra -never took didnt like side effects read about online  . Shortness of breath   . Sleep apnea    ? neg  . Spinal stenosis of lumbar region   . Spondylolisthesis   . Status post placement of implantable loop recorder   . Supraventricular tachycardia (HCC)   . Syncope and collapse    s/p ILR; no arhythmogenic cause identified  . UTI (lower urinary tract infection)     Family History  Problem Relation Age of Onset  . Heart attack Father   . Cancer Father   . Mental illness Father   . Cancer Mother   . Mental illness Mother   . Heart attack Brother        stents  . Alcohol abuse Brother   . Heart disease Brother   . Drug abuse Brother   . Diabetes Brother   . Colon cancer Maternal Aunt   . Cirrhosis Brother   . Stomach cancer Neg Hx     Past Surgical History:  Procedure Laterality Date  . BACK SURGERY    . BREAST SURGERY     lumpectomy  . CATARACT EXTRACTION W/PHACO Right 07/31/2017   Procedure: CATARACT EXTRACTION PHACO AND INTRAOCULAR LENS  PLACEMENT (IOC);  Surgeon: Fabio Pierce, MD;  Location: [facility];  Service: Ophthalmology;  Laterality: Right;  CDE: 2.33  . CATARACT EXTRACTION W/PHACO Left 08/14/2017   Procedure: CATARACT EXTRACTION PHACO AND INTRAOCULAR LENS PLACEMENT (IOC);  Surgeon: Fabio Pierce, MD;  Location: [facility];  Service: Ophthalmology;  Laterality: Left;  CDE: 2.74  . CHOLECYSTECTOMY    . CYSTOSCOPY     stone  . DOPPLER ECHOCARDIOGRAPHY  2009  . head up tilt table testing  06/15/2007   [name]  . HEMORRHOID SURGERY    . insertion of implatable loop recorder  08/11/2007   [name]  . POSTERIOR CERVICAL FUSION/FORAMINOTOMY N/A 12/19/2013   Procedure: RIGHT C3-4.C4-5 AND C5-6 FORAMINOTOMIES;  Surgeon: Kerrin Champagne, MD;  Location: [facility];  Service: Orthopedics;  Laterality: N/A;  . TUBAL LIGATION     Social History   Occupational History  . Occupation: Disability    Comment: 15 years  Tobacco Use  . Smoking status: Never Smoker  . Smokeless tobacco: Never Used  Substance and Sexual Activity  . Alcohol use: No    Alcohol/week: 0.0 standard drinks  . Drug use: No  . Sexual activity: Yes

## 2018-07-19 NOTE — Progress Notes (Signed)
Subjective: Patient is here for ultrasound-guided intra-articular right glenohumeral injection.  Objective: Severe pain with passive and active range of motion, diffuse tenderness around the shoulder.  Procedure: Ultrasound-guided right glenohumeral injection: After sterile prep with Betadine, injected 8 cc 1% lidocaine without epinephrine and 40 mg methylprednisolone using a 22-gauge spinal needle, passing the needle through approach into the glenohumeral joint.  Injectate was seen filling the joint.  She still did not have much relief during the immediate anesthetic phase.  3-day supply of Percocet for postinjection pain.  Follow-up as needed.

## 2018-07-19 NOTE — Progress Notes (Signed)
Chief Complaint  Patient presents with  . right shoulder pain    HPI  Patient presents today for continued pain from right shoulder. Injured when her daughter's dog jumped on her about one month ago. Unable to move the arm any direction. She has constant moderate to severe pain. This in spite of the chronic opiate use for her lumbar spondylosis and the sterapred pack given by Ms. Daphine Deutscher just after onset. She is right hand dominant and therefore unable to pursue all but the most basic ADLs.  PMH: Smoking status noted ROS: Review of Systems  Constitutional: Negative.   HENT: Negative.   Eyes: Negative for visual disturbance.  Respiratory: Negative for shortness of breath.   Cardiovascular: Negative for chest pain.  Gastrointestinal: Negative for abdominal pain.  Musculoskeletal: Positive for arthralgias and back pain.     Objective: BP (!) 138/94   Pulse 80   Temp 98 F (36.7 C) (Oral)   Ht 5\' 1"  (1.549 m)   Wt 148 lb (67.1 kg)   BMI 27.96 kg/m  Gen: NAD, alert, cooperative with exam HEENT: NCAT, EOMI, PERRL CV: RRR, good S1/S2, no murmur Resp: CTABL, no wheezes, non-labored Ext: No edema, warm. Right shoulder deformity -  rotated forward and inferiorly. It isexquisitely tender at deltoid margin and posteriorly for palpation.. Painful for passive ROM for rotation ans well as flexion and abduction. The RUE is neurovascularly intact. Neuro: Alert and oriented, No gross deficits XR- no fracture noted. Assessment and plan:  1. Right shoulder pain, unspecified chronicity     No orders of the defined types were placed in this encounter.   Orders Placed This Encounter  Procedures  . DG Shoulder Right    Standing Status:   Future    Number of Occurrences:   1    Standing Expiration Date:   09/18/2019    Order Specific Question:   Reason for Exam (SYMPTOM  OR DIAGNOSIS REQUIRED)    Answer:   right shoulder / upper arm pain    Order Specific Question:   Preferred imaging  location?    Answer:   Internal  . Ambulatory referral to Orthopedic Surgery    Referral Priority:   Routine    Referral Type:   Surgical    Referral Reason:   Specialty Services Required    Requested Specialty:   Orthopedic Surgery    Number of Visits Requested:   1   Due to deformity of shoulder, referral to ortho urgently was performed. Pt. To discuss further pain intervention with Ms. Daphine Deutscher as needed.  Follow up as needed.  Mechele Claude, MD

## 2018-07-20 ENCOUNTER — Other Ambulatory Visit: Payer: Self-pay

## 2018-07-20 ENCOUNTER — Encounter: Payer: Self-pay | Admitting: Nurse Practitioner

## 2018-07-20 ENCOUNTER — Ambulatory Visit (INDEPENDENT_AMBULATORY_CARE_PROVIDER_SITE_OTHER): Payer: 59 | Admitting: Nurse Practitioner

## 2018-07-20 DIAGNOSIS — M47812 Spondylosis without myelopathy or radiculopathy, cervical region: Secondary | ICD-10-CM | POA: Diagnosis not present

## 2018-07-20 DIAGNOSIS — M9981 Other biomechanical lesions of cervical region: Secondary | ICD-10-CM | POA: Diagnosis not present

## 2018-07-20 DIAGNOSIS — Q762 Congenital spondylolisthesis: Secondary | ICD-10-CM | POA: Diagnosis not present

## 2018-07-20 DIAGNOSIS — G8929 Other chronic pain: Secondary | ICD-10-CM

## 2018-07-20 DIAGNOSIS — M47817 Spondylosis without myelopathy or radiculopathy, lumbosacral region: Secondary | ICD-10-CM | POA: Diagnosis not present

## 2018-07-20 DIAGNOSIS — M4802 Spinal stenosis, cervical region: Secondary | ICD-10-CM

## 2018-07-20 MED ORDER — HYDROCODONE-ACETAMINOPHEN 7.5-325 MG PO TABS: 1.0000 | ORAL_TABLET | Freq: Two times a day (BID) | ORAL | 0 refills | Status: DC | PRN

## 2018-07-20 MED ORDER — HYDROCODONE-ACETAMINOPHEN 7.5-325 MG PO TABS: 1.0000 | ORAL_TABLET | Freq: Two times a day (BID) | ORAL | 0 refills | Status: AC

## 2018-07-20 NOTE — Telephone Encounter (Signed)
Had telephone visit with patient on 07/20/18

## 2018-07-20 NOTE — Progress Notes (Signed)
Virtual Visit via telephone Note  I connected with [name] on 07/20/18 at 10:10 AM by telephone and verified that I am speaking with the correct person using two identifiers. [name] is currently located at home and daughter. is currently with her during visit. The provider, [name] Daphine DeutscherMartin, FNP is located in their office at time of visit.  I discussed the limitations, risks, security and privacy concerns of performing an evaluation and management service by telephone and the availability of in person appointments. I also discussed with the patient that there may be a patient responsible charge related to this service. The patient expressed understanding and agreed to proceed.   History and Present Illness: back pain   Pain assessment: Cause of pain- DDD Pain location- lower back Pain on scale of 1-10- 6/10 Frequency- daily but some days only takes 1 tablet What increases pain-lots of activity What makes pain Better-rest Effects on ADL - some days cannot even get out of bed Any change in general medical condition-none  Current opioids rx- norco 7.5/325 BID # meds rx- 60 Effectiveness of current meds-helps Adverse reactions form pain meds-none Morphine equivalent- 15 MEDD  Pill count performed-No Last drug screen - normal because she had not had any pain meds when last drug screen was done. ( high risk q2142m, moderate risk q5194m, low risk yearly ) Urine drug screen today- No Was the [facility] reviewed- yes  If yes were their any concerning findings? - yes * patient got 10 percocets from orthopedist for her shoulder pain- shs was told if does again she will no longer be able to get pain meds from this office.     Pain contract signed on:12/24/17      Review of Systems  Constitutional: Negative.   Respiratory: Positive for cough. Negative for shortness of breath.   Cardiovascular: Negative.   Genitourinary: Negative.   Musculoskeletal: Positive for  back pain.  Neurological: Negative.   Psychiatric/Behavioral: Negative.   All other systems reviewed and are negative.    Observations/Objective: Alert and oriented Coughing while on phone  Assessment and Plan: [name] in today with chief complaint of No chief complaint on file.   1. Cervical spondylosis without myelopathy  2. SPONDYLOLISTHESIS  3. Lumbosacral spondylosis without myelopathy  4. Neural foraminal stenosis of cervical spine  5. Encounter for chronic pain management Reviewed pain management contract  Meds ordered this encounter  Medications  . HYDROcodone-acetaminophen (NORCO) 7.5-325 MG tablet    Sig: Take 1 tablet by mouth 2 (two) times daily as needed for up to 30 days.    Dispense:  60 tablet    Refill:  0    Order Specific Question:   Supervising Provider    Answer:   Arville CareETTINGER, JOSHUA A [id number]]  . HYDROcodone-acetaminophen (NORCO) 7.5-325 MG tablet    Sig: Take 1 tablet by mouth 2 (two) times a day for 30 days.    Dispense:  660 tablet    Refill:  0    Order Specific Question:   Supervising Provider    Answer:   Arville CareETTINGER, JOSHUA A [[id number]]   Will need drug screen at next visit   Follow Up Instructions: In June as scheduled    I discussed the assessment and treatment plan with the patient. The patient was provided an opportunity to ask questions and all were answered. The patient agreed with the plan and demonstrated an understanding of the instructions.   The patient was advised to call  back or seek an in-person evaluation if the symptoms worsen or if the condition fails to improve as anticipated.  The above assessment and management plan was discussed with the patient. The patient verbalized understanding of and has agreed to the management plan. Patient is aware to call the clinic if symptoms persist or worsen. Patient is aware when to return to the clinic for a follow-up visit. Patient educated on when it is appropriate to go to  the emergency department.    I provided 10 minutes of non-face-to-face time during this encounter.    [name] Daphine Deutscher, FNP

## 2018-07-29 ENCOUNTER — Ambulatory Visit (INDEPENDENT_AMBULATORY_CARE_PROVIDER_SITE_OTHER): Payer: 59 | Admitting: *Deleted

## 2018-07-29 ENCOUNTER — Other Ambulatory Visit: Payer: Self-pay

## 2018-07-29 DIAGNOSIS — Z0000 Encounter for general adult medical examination without abnormal findings: Secondary | ICD-10-CM | POA: Diagnosis not present

## 2018-07-29 NOTE — Patient Instructions (Signed)
Preventive Care 40-64 Years, Female Preventive care refers to lifestyle choices and visits with your health care provider that can promote health and wellness. What does preventive care include?   A yearly physical exam. This is also called an annual well check.  Dental exams once or twice a year.  Routine eye exams. Ask your health care provider how often you should have your eyes checked.  Personal lifestyle choices, including: ? Daily care of your teeth and gums. ? Regular physical activity. ? Eating a healthy diet. ? Avoiding tobacco and drug use. ? Limiting alcohol use. ? Practicing safe sex. ? Taking low-dose aspirin daily starting at age 50. ? Taking vitamin and mineral supplements as recommended by your health care provider. What happens during an annual well check? The services and screenings done by your health care provider during your annual well check will depend on your age, overall health, lifestyle risk factors, and family history of disease. Counseling Your health care provider may ask you questions about your:  Alcohol use.  Tobacco use.  Drug use.  Emotional well-being.  Home and relationship well-being.  Sexual activity.  Eating habits.  Work and work environment.  Method of birth control.  Menstrual cycle.  Pregnancy history. Screening You may have the following tests or measurements:  Height, weight, and BMI.  Blood pressure.  Lipid and cholesterol levels. These may be checked every 5 years, or more frequently if you are over 50 years old.  Skin check.  Lung cancer screening. You may have this screening every year starting at age 55 if you have a 30-pack-year history of smoking and currently smoke or have quit within the past 15 years.  Colorectal cancer screening. All adults should have this screening starting at age 50 and continuing until age 75. Your health care provider may recommend screening at age 45. You will have tests every  1-10 years, depending on your results and the type of screening test. People at increased risk should start screening at an earlier age. Screening tests may include: ? Guaiac-based fecal occult blood testing. ? Fecal immunochemical test (FIT). ? Stool DNA test. ? Virtual colonoscopy. ? Sigmoidoscopy. During this test, a flexible tube with a tiny camera (sigmoidoscope) is used to examine your rectum and lower colon. The sigmoidoscope is inserted through your anus into your rectum and lower colon. ? Colonoscopy. During this test, a long, thin, flexible tube with a tiny camera (colonoscope) is used to examine your entire colon and rectum.  Hepatitis C blood test.  Hepatitis B blood test.  Sexually transmitted disease (STD) testing.  Diabetes screening. This is done by checking your blood sugar (glucose) after you have not eaten for a while (fasting). You may have this done every 1-3 years.  Mammogram. This may be done every 1-2 years. Talk to your health care provider about when you should start having regular mammograms. This may depend on whether you have a family history of breast cancer.  BRCA-related cancer screening. This may be done if you have a family history of breast, ovarian, tubal, or peritoneal cancers.  Pelvic exam and Pap test. This may be done every 3 years starting at age 21. Starting at age 30, this may be done every 5 years if you have a Pap test in combination with an HPV test.  Bone density scan. This is done to screen for osteoporosis. You may have this scan if you are at high risk for osteoporosis. Discuss your test results, treatment options,   and if necessary, the need for more tests with your health care provider. Vaccines Your health care provider may recommend certain vaccines, such as:  Influenza vaccine. This is recommended every year.  Tetanus, diphtheria, and acellular pertussis (Tdap, Td) vaccine. You may need a Td booster every 10 years.  Varicella  vaccine. You may need this if you have not been vaccinated.  Zoster vaccine. You may need this after age 38.  Measles, mumps, and rubella (MMR) vaccine. You may need at least one dose of MMR if you were born in 1957 or later. You may also need a second dose.  Pneumococcal 13-valent conjugate (PCV13) vaccine. You may need this if you have certain conditions and were not previously vaccinated.  Pneumococcal polysaccharide (PPSV23) vaccine. You may need one or two doses if you smoke cigarettes or if you have certain conditions.  Meningococcal vaccine. You may need this if you have certain conditions.  Hepatitis A vaccine. You may need this if you have certain conditions or if you travel or work in places where you may be exposed to hepatitis A.  Hepatitis B vaccine. You may need this if you have certain conditions or if you travel or work in places where you may be exposed to hepatitis B.  Haemophilus influenzae type b (Hib) vaccine. You may need this if you have certain conditions. Talk to your health care provider about which screenings and vaccines you need and how often you need them. This information is not intended to replace advice given to you by your health care provider. Make sure you discuss any questions you have with your health care provider. Document Released: 04/13/2015 Document Revised: 05/07/2017 Document Reviewed: 01/16/2015 Elsevier Interactive Patient Education  2019 [employer].

## 2018-07-29 NOTE — Progress Notes (Addendum)
MEDICARE ANNUAL WELLNESS VISIT  07/29/2018  Telephone Visit Disclaimer This Medicare AWV was conducted by telephone due to national recommendations for restrictions regarding the COVID-19 Pandemic (e.g. social distancing).  I verified, using two identifiers, that I am speaking with [name] or their authorized healthcare agent. I discussed the limitations, risks, security, and privacy concerns of performing an evaluation and management service by telephone and the potential availability of an in-person appointment in the future. The patient expressed understanding and agreed to proceed.   Subjective:  [name] is a 63 y.o. female patient of Bennie Pierini, FNP who had a Medicare Annual Wellness Visit today via telephone. [name] is Disabled and lives alone. she has 6 children. she reports that she is socially active and does interact with friends/family regularly. she is minimally physically active and enjoys spending time with her family.  Patient Care Team: Bennie Pierini, FNP as PCP - General (Nurse Practitioner) Antoine Poche, MD as PCP - Cardiology (Cardiology) Kerrin Champagne, MD as Consulting Physician (Orthopedic Surgery) Marinus Maw, MD as Consulting Physician (Cardiology) Antoine Poche, MD as Consulting Physician (Cardiology)  Advanced Directives 07/29/2018 12/22/2017 08/14/2017 07/31/2017 07/24/2017 04/30/2017 12/12/2016  Does Patient Have a Medical Advance Directive? No No No No No No No  Would patient like information on creating a medical advance directive? No - Patient declined - No - Patient declined No - Patient declined No - Patient declined - -  Pre-existing out of facility DNR order (yellow form or pink MOST form) - - - - - - -    Hospital Utilization Over the Past 12 Months: # of hospitalizations or ER visits: 1 # of surgeries: 0  Review of Systems    Patient reports that her overall health is worse compared to last year.  Patient Reported Readings (BP, Pulse, CBG, Weight, etc) none  Review of Systems: Musculoskeletal ROS: positive for - pain in back - lower  All other systems negative.  Pain Assessment Pain : 0-10 Pain Score: 6  Pain Type: Chronic pain Pain Location: Back Pain Orientation: Lower Pain Descriptors / Indicators: Constant Pain Onset: More than a month ago Pain Frequency: Constant Pain Relieving Factors: pain medication  Pain Relieving Factors: pain medication  Current Medications & Allergies (verified) Allergies as of 07/29/2018      Reactions   Codeine Other (See Comments)   "I will have a heart attack."   Morphine And Related Other (See Comments)   "It will cause me to have a heart attack."   Ambien [zolpidem Tartrate] Nausea And Vomiting   Lyrica [pregabalin] Swelling, Other (See Comments)   Weight gain   Neurontin [gabapentin] Other (See Comments)   Causes elevated LFTs      Medication List       Accurate as of July 29, 2018 11:09 AM. Always use your most recent med list.        acyclovir ointment 5 % Commonly known as:  ZOVIRAX APPLY TO AFFECTED AREA EVERY 3 HOURS   albuterol (2.5 MG/3ML) 0.083% nebulizer solution Commonly known as:  PROVENTIL USE 1 VIAL IN NEBULIZER EVERY 4 HOURS AS NEEDED FOR WHEEZING.   albuterol 108 (90 Base) MCG/ACT inhaler Commonly known as:  VENTOLIN HFA INHALE 2 PUFFS EVERY 6 HOURS AS NEEDED FOR SHORTNESS OF BREATH AND WHEEZING.   Alcohol Pads 70 % Pads USE 1 PAD DAILY WHEN CHECKING BLOOD SUGAR.   busPIRone 10 MG tablet Commonly known as:  BUSPAR 1  po bid prn   butalbital-acetaminophen-caffeine 50-325-40 MG tablet Commonly known as:  FIORICET TAKE 1 TABLET BY MOUTH EVERY 6 HOURS AS NEEDED FOR HEADACHE.   carvedilol 6.25 MG tablet Commonly known as:  COREG Take 1 tablet (6.25 mg total) by mouth 4 (four) times daily as needed.   dexlansoprazole 60 MG capsule Commonly known as:  Dexilant Take 1 capsule (60 mg total) by  mouth daily.   diazepam 5 MG tablet Commonly known as:  VALIUM 1 po bid   DULoxetine 60 MG capsule Commonly known as:  CYMBALTA Take 1 capsule (60 mg total) by mouth at bedtime.   escitalopram 20 MG tablet Commonly known as:  LEXAPRO TAKE (1) TABLET BY MOUTH ONCE DAILY.   estradiol 0.1 MG/GM vaginal cream Commonly known as:  ESTRACE PLACE 1 APPLICATORFUL VAGINALLY AT BEDTIME.   fludrocortisone 0.1 MG tablet Commonly known as:  FLORINEF TAKE 2 TABLETS BY MOUTH ONCE DAILY.   fluticasone 50 MCG/ACT nasal spray Commonly known as:  FLONASE Place 2 sprays into both nostrils daily.   Fluticasone-Umeclidin-Vilant 100-62.5-25 MCG/INH Aepb Commonly known as:  Trelegy Ellipta Inhale 1 puff into the lungs daily.   furosemide 40 MG tablet Commonly known as:  LASIX TAKE 1 TABLET AS NEEDED FOR SWELLING   glucose blood test strip Commonly known as:  OneTouch Verio Test blood sugar daily   HYDROcodone-acetaminophen 7.5-325 MG tablet Commonly known as:  NORCO Take 1 tablet by mouth 2 (two) times daily as needed for up to 30 days.   HYDROcodone-acetaminophen 7.5-325 MG tablet Commonly known as:  Norco Take 1 tablet by mouth 2 (two) times a day for 30 days.   ipratropium 0.02 % nebulizer solution Commonly known as:  ATROVENT USE 1 VIAL IN NEBULIZER EVERY 4 HOURS AS NEEDED FOR WHEEZING.   isosorbide mononitrate 30 MG 24 hr tablet Commonly known as:  IMDUR Take 1 tablet (30 mg total) by mouth daily.   lamoTRIgine 150 MG tablet Commonly known as:  LAMICTAL TAKE 1 TABLET BY MOUTH ONCE DAILY.   levETIRAcetam 500 MG tablet Commonly known as:  KEPPRA TAKE (1) TABLET TWICE DAILY.   levocetirizine 5 MG tablet Commonly known as:  XYZAL TAKE 1 TABLET BY MOUTH EVERY MORNING.   nitroGLYCERIN 0.4 MG SL tablet Commonly known as:  NITROSTAT PLACE 1 TAB UNDER TONGUE EVERY 3 MINUTES AS DIRECTED UP TO 3 TIMES ASNEEDED FOR CHEST PAIN.   ondansetron 4 MG tablet Commonly known as:   ZOFRAN Take 1 tablet (4 mg total) by mouth every 8 (eight) hours as needed for nausea or vomiting.   OneTouch Delica Lancets 33G Misc TEST BLOOD SUGAR ONCE DAILY.   oxybutynin 5 MG tablet Commonly known as:  DITROPAN TAKE (1) TABLET TWICE DAILY.   ranitidine 150 MG tablet Commonly known as:  ZANTAC Take 1 tablet (150 mg total) by mouth at bedtime.   rosuvastatin 10 MG tablet Commonly known as:  CRESTOR TAKE (1) TABLET BY MOUTH ONCE DAILY.   sucralfate 1 GM/10ML suspension Commonly known as:  CARAFATE Take 10 mLs (1 g total) by mouth 2 (two) times daily.   tiZANidine 4 MG tablet Commonly known as:  ZANAFLEX TAKE (1) TABLET EVERY SIX HOURS AS NEEDED FOR MUSCLE SPASMS.   tolterodine 2 MG tablet Commonly known as:  DETROL TAKE (1) TABLET TWICE DAILY.   traZODone 150 MG tablet Commonly known as:  DESYREL Take 1 tablet (150 mg total) by mouth at bedtime.   valACYclovir 500 MG tablet Commonly known  as:  VALTREX TAKE (1) TABLET TWICE DAILY.       History (reviewed): Past Medical History:  Diagnosis Date  . Anginal pain (HCC)    last time   . Anxiety   . Arthritis    RHEUMATOID  . Asthma   . Bipolar 1 disorder (HCC)   . Cataracts, bilateral 07/2017  . COPD (chronic obstructive pulmonary disease) (HCC)   . Coronary artery disease    reported hx of "MI";  Echo 2009 with normal LVF;  Myoview 05/2011: no ischemia  . Depression   . Dyslipidemia   . Dysrhythmia    SVT  . Esophageal stricture   . Fibromyalgia   . GERD (gastroesophageal reflux disease)   . H/O hiatal hernia   . Head injury, unspecified   . Herpes simplex infection   . History of kidney stones   . Hyperlipidemia   . Hypertension   . Insomnia   . Myocardial infarction [facility])    age 67  . Osteoporosis   . Pneumonia    hx  . Seizures (HCC)    last 3 weeks ago- prescribed keppra -never took didnt like side effects read about online  . Shortness of breath   . Sleep apnea    ? neg  . Spinal  stenosis of lumbar region   . Spondylolisthesis   . Status post placement of implantable loop recorder   . Supraventricular tachycardia (HCC)   . Syncope and collapse    s/p ILR; no arhythmogenic cause identified  . UTI (lower urinary tract infection)    Past Surgical History:  Procedure Laterality Date  . BACK SURGERY    . BREAST SURGERY     lumpectomy  . CATARACT EXTRACTION W/PHACO Right 07/31/2017   Procedure: CATARACT EXTRACTION PHACO AND INTRAOCULAR LENS PLACEMENT (IOC);  Surgeon: Fabio Pierce, MD;  Location: [facility];  Service: Ophthalmology;  Laterality: Right;  CDE: 2.33  . CATARACT EXTRACTION W/PHACO Left 08/14/2017   Procedure: CATARACT EXTRACTION PHACO AND INTRAOCULAR LENS PLACEMENT (IOC);  Surgeon: Fabio Pierce, MD;  Location: [facility];  Service: Ophthalmology;  Laterality: Left;  CDE: 2.74  . CHOLECYSTECTOMY    . CYSTOSCOPY     stone  . DOPPLER ECHOCARDIOGRAPHY  2009  . head up tilt table testing  06/15/2007   [name]  . HEMORRHOID SURGERY    . insertion of implatable loop recorder  08/11/2007   [name]  . POSTERIOR CERVICAL FUSION/FORAMINOTOMY N/A 12/19/2013   Procedure: RIGHT C3-4.C4-5 AND C5-6 FORAMINOTOMIES;  Surgeon: Kerrin Champagne, MD;  Location[facility];  Service: Orthopedics;  Laterality: N/A;  . TUBAL LIGATION     Family History  Problem Relation Age of Onset  . Heart attack Father   . Cancer Father   . Mental illness Father   . Cancer Mother   . Mental illness Mother   . Heart attack Brother        stents  . Alcohol abuse Brother   . Heart disease Brother   . Drug abuse Brother   . Diabetes Brother   . Colon cancer Maternal Aunt   . Cirrhosis Brother   . Stomach cancer Neg Hx    Social History   Socioeconomic History  . Marital status: Single    Spouse name: Not on file  . Number of children: 6  . Years of education: Not on file  . Highest education level: 6th grade  Occupational History  . Occupation: Disability    Comment: 15  years   Social Needs  . Financial resource strain: Somewhat hard  . Food insecurity:    Worry: Sometimes true    Inability: Sometimes true  . Transportation needs:    Medical: No    Non-medical: No  Tobacco Use  . Smoking status: Never Smoker  . Smokeless tobacco: Never Used  Substance and Sexual Activity  . Alcohol use: No    Alcohol/week: 0.0 standard drinks  . Drug use: No  . Sexual activity: Not Currently  Lifestyle  . Physical activity:    Days per week: 0 days    Minutes per session: 0 min  . Stress: To some extent  Relationships  . Social connections:    Talks on phone: More than three times a week    Gets together: More than three times a week    Attends religious service: Never    Active member of club or organization: No    Attends meetings of clubs or organizations: Never    Relationship status: Patient refused  Other Topics Concern  . Not on file  Social History Narrative   Patient lives with Daughter   Patient is on disability.    Patient has 8th grade education.    Patient has 6 children, 24 grand-chilren, and 5 great-grand children.    Right handed     Activities of Daily Living In your present state of health, do you have any difficulty performing the following activities: 07/29/2018  Hearing? N  Vision? N  Difficulty concentrating or making decisions? N  Walking or climbing stairs? Y  Comment due to back pain  Dressing or bathing? N  Doing errands, shopping? N  Preparing Food and eating ? N  Using the Toilet? N  In the past six months, have you accidently leaked urine? Y  Comment stress incontinence with coughing  Do you have problems with loss of bowel control? N  Managing your Medications? N  Managing your Finances? N  Housekeeping or managing your Housekeeping? N  Some recent data might be hidden    Patient Literacy How often do you need to have someone help you when you read instructions, pamphlets, or other written materials from your doctor  or pharmacy?: 1 - Never What is the last grade level you completed in school?: 5th grade  Exercise Current Exercise Habits: The patient does not participate in regular exercise at present, Exercise limited by: orthopedic condition(s)  Diet Patient reports consuming 2 meals a day and 1 snack(s) a day Patient reports that her primary diet is: Regular Patient reports that she does have regular access to food.   Depression Screen PHQ 2/9 Scores 07/29/2018 06/15/2018 05/28/2018 05/17/2018 04/07/2018 03/16/2018 02/09/2018  PHQ - 2 Score 0 0 6 6 0 0 0  PHQ- 9 Score - - 18 18 - - 0     Fall Risk Fall Risk  07/29/2018 06/15/2018 03/16/2018 02/09/2018 12/22/2017  Falls in the past year? 1 0 0 0 No  Number falls in past yr: 1 - - - -  Comment - - - - -  Injury with Fall? 1 - - - -  Risk Factor Category  - - - - -  Risk for fall due to : History of fall(s) - - - -  Risk for fall due to: Comment - - - - -  Follow up - - - - -     Objective:  [name] seemed alert and oriented and she participated appropriately during our  telephone visit.  Blood Pressure Weight BMI  BP Readings from Last 3 Encounters:  07/19/18 (!) 138/94  06/15/18 (!) 159/93  05/28/18 (!) 148/93   Wt Readings from Last 3 Encounters:  07/19/18 148 lb (67.1 kg)  06/15/18 147 lb (66.7 kg)  05/28/18 151 lb 9.6 oz (68.8 kg)   BMI Readings from Last 1 Encounters:  07/19/18 27.96 kg/m    *Unable to obtain current vital signs, weight, and BMI due to telephone visit type  Hearing/Vision  . [name] did not seem to have difficulty with hearing/understanding during the telephone conversation . Reports that she has not had a formal eye exam by an eye care professional within the past year . Reports that she has not had a formal hearing evaluation within the past year *Unable to fully assess hearing and vision during telephone visit type  Cognitive Function: 6CIT Screen 07/29/2018  What Year? 0 points  What month? 0  points  What time? 0 points  Count back from 20 0 points  Months in reverse 4 points  Repeat phrase 2 points  Total Score 6    Normal Cognitive Function Screening: Yes (Normal:0-7, Significant for Dysfunction: >8)  Immunization & Health Maintenance Record Immunization History  Administered Date(s) Administered  . Influenza Whole 12/19/2011  . Influenza,inj,Quad PF,6+ Mos 01/25/2013, 01/05/2014, 02/12/2016, 05/07/2017, 03/16/2018  . Pneumococcal Conjugate-13 01/05/2014  . Pneumococcal Polysaccharide-23 11/30/2011  . Tdap 08/05/2011  . Zoster 01/02/2012    Health Maintenance  Topic Date Due  . INFLUENZA VACCINE  10/30/2018  . PAP SMEAR-Modifier  10/15/2019  . TETANUS/TDAP  08/04/2021  . COLONOSCOPY  04/26/2022  . Hepatitis C Screening  Completed  . HIV Screening  Completed  . MAMMOGRAM  Discontinued       Assessment  This is a routine wellness examination for [name].  Health Maintenance: Due or Overdue There are no preventive care reminders to display for this patient.  [name] does not need a referral for Community Assistance: Care Management:   no Social Work:    no Prescription Assistance:  no Nutrition/Diabetes Education:  no   Plan:  Personalized Goals Goals Addressed            This Visit's Progress   . DIET - REDUCE SUGAR INTAKE        Personalized Health Maintenance & Screening Recommendations  N/A  Lung Cancer Screening Recommended: no (Low Dose CT Chest recommended if Age 40-80 years, 30 pack-year currently smoking OR have quit w/in past 15 years) Hepatitis C Screening recommended: no HIV Screening recommended: no  Advanced Directives: Written information was not prepared per patient's request.  Referrals & Orders No orders of the defined types were placed in this encounter.   Follow-up Plan . Follow-up with Bennie Pierini, FNP as planned.   I have personally reviewed and noted the following in the patient's  chart:   . Medical and social history . Use of alcohol, tobacco or illicit drugs  . Current medications and supplements . Functional ability and status . Nutritional status . Physical activity . Advanced directives . List of other physicians . Hospitalizations, surgeries, and ER visits in previous 12 months . Vitals . Screenings to include cognitive, depression, and falls . Referrals and appointments  In addition, I have reviewed and discussed with [name] certain preventive protocols, quality metrics, and best practice recommendations. A written personalized care plan for preventive services as well as general preventive health recommendations is available and can  be mailed to the patient at her request.      Joselyn GlassmanKarla Bashir Marchetti, LPN  [id number]/30/2020

## 2018-07-30 ENCOUNTER — Other Ambulatory Visit: Payer: Self-pay | Admitting: Nurse Practitioner

## 2018-07-30 DIAGNOSIS — F5101 Primary insomnia: Secondary | ICD-10-CM

## 2018-08-16 ENCOUNTER — Other Ambulatory Visit: Payer: Self-pay | Admitting: Nurse Practitioner

## 2018-08-17 ENCOUNTER — Telehealth: Payer: Self-pay | Admitting: Nurse Practitioner

## 2018-08-19 NOTE — Telephone Encounter (Signed)
According to chart she has a pian rx to be filled on 5/21 and that should last her until her next appointment

## 2018-08-19 NOTE — Telephone Encounter (Signed)
Pt got meds on 5/19 per laynes - she has appt 6/12 before she runs out - pt aware all normal

## 2018-08-27 ENCOUNTER — Telehealth: Payer: Self-pay | Admitting: *Deleted

## 2018-08-27 NOTE — Telephone Encounter (Signed)
Patient verbally consented for tele-health visits with [facility] and understands that her insurance company will be billed for the encounter.  Aware to have vitals available   

## 2018-08-31 ENCOUNTER — Ambulatory Visit (INDEPENDENT_AMBULATORY_CARE_PROVIDER_SITE_OTHER): Payer: 59 | Admitting: Nurse Practitioner

## 2018-08-31 ENCOUNTER — Encounter: Payer: Self-pay | Admitting: Nurse Practitioner

## 2018-08-31 ENCOUNTER — Other Ambulatory Visit: Payer: Self-pay

## 2018-08-31 ENCOUNTER — Telehealth: Payer: 59 | Admitting: Cardiology

## 2018-08-31 DIAGNOSIS — J40 Bronchitis, not specified as acute or chronic: Secondary | ICD-10-CM | POA: Diagnosis not present

## 2018-08-31 MED ORDER — PREDNISONE 20 MG PO TABS: ORAL_TABLET | ORAL | 0 refills | Status: DC

## 2018-08-31 NOTE — Progress Notes (Signed)
Patient ID: GOHAR MCKEAG, female   DOB: Jul 07, 1955, 63 y.o.   MRN: [id number]    Virtual Visit via telephone Note  I connected with [name] on 08/31/18 at 12:05PM by telephone and verified that I am speaking with the correct person using two identifiers. [name] is currently located at home and no one is currently with her during visit. The provider, [name] Daphine Deutscher, FNP is located in their office at time of visit.  I discussed the limitations, risks, security and privacy concerns of performing an evaluation and management service by telephone and the availability of in person appointments. I also discussed with the patient that there may be a patient responsible charge related to this service. The patient expressed understanding and agreed to proceed.   History and Present Illness:   Patient ID: [name], female    DOB: 11/30/1955, 63 y.o.   MRN: [id number]   Chief Complaint: Cough   HPI Patient calls in today c/o cough that started 2 days ago. Cough is productive with slight SOB. She has been using nebulizer which has helped.   Review of Systems  Constitutional: Negative for chills and fever.  HENT: Negative for congestion, sinus pain, sore throat, trouble swallowing and voice change.   Respiratory: Positive for cough (productive) and shortness of breath (slight).   Cardiovascular: Negative.   Gastrointestinal: Negative.   Genitourinary: Negative.   Neurological: Negative.   Psychiatric/Behavioral: Negative.   All other systems reviewed and are negative.     Observations/Objective: Alert and oriented Cough wet sounding Voice hoarse  Assessment and Plan: [name] in today with chief complaint of Cough   1. Bronchitis 1. Take meds as prescribed 2. Use a cool mist humidifier especially during the winter months and when heat has been humid. 3. Use saline nose sprays frequently 4. Saline irrigations of the nose can be very  helpful if done frequently.  * 4X daily for 1 week*  * Use of a nettie pot can be helpful with this. Follow directions with this* 5. Drink plenty of fluids 6. Keep thermostat turn down low 7.For any cough or congestion  Use plain Mucinex- regular strength or max strength is fine   * Children- consult with Pharmacist for dosing 8. For fever or aces or pains- take tylenol or ibuprofen appropriate for age and weight.  * for fevers greater than 101 orally you may alternate ibuprofen and tylenol every  3 hours.  \ - predniSONE (DELTASONE) 20 MG tablet; 2 po at sametime daily for 5 days  Dispense: 10 tablet; Refill: 0   Follow Up Instructions: prn    I discussed the assessment and treatment plan with the patient. The patient was provided an opportunity to ask questions and all were answered. The patient agreed with the plan and demonstrated an understanding of the instructions.   The patient was advised to call back or seek an in-person evaluation if the symptoms worsen or if the condition fails to improve as anticipated.  The above assessment and management plan was discussed with the patient. The patient verbalized understanding of and has agreed to the management plan. Patient is aware to call the clinic if symptoms persist or worsen. Patient is aware when to return to the clinic for a follow-up visit. Patient educated on when it is appropriate to go to the emergency department.   Time call ended:  12:25  I provided 20 minutes of non-face-to-face time during this encounter.  [name] Keniesha Adderly, FNP   

## 2018-08-31 NOTE — Progress Notes (Unsigned)
{Choose 1 Note Type (Telehealth Visit or Telephone Visit):[phone]}   Date:  08/31/2018   ID:  [name], DOB 04/11/1955, MRN [id number]  {Patient Location:[phone]::"Home"} {Provider [phone]::"Home"}  PCP:  Bennie PieriniMartin, [name], FNP  Cardiologist:  Dina RichBranch, Debbrah Sampedro, MD *** Electrophysiologist:  None   Evaluation Performed:  {Choose Visit Type:[phone]::"Follow-Up Visit"}  Chief Complaint:  ***  History of Present Illness:    [name] is a 63 y.o. female with ***   seen today for follow up of the following medical problems.  1. Chest pain - several year history of chest pain - normal cath in 2013, normal stress test 2013 - [facility]. Negative workup for ACS. She was discharged with an out patient Lexiscan which showed no evidence of ischemia.  - echo 11/2014 LVEF 55-60%, no WMAs.  -09/2017 nuclear stress no ischemia. - started on imdur by pcpwith improved symptoms.  - no recent chest pain.   2. 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 Got out of bed to bathroom, in bathroom felt lightheaded and dizzy. Feel to floor, + LOC. Unsure how long she was out for. Daughter got her back into bed, checked bp and it was 60/52.   [facility], reprorts low bp's at that time.  - takes coreg about 2-3 days a weekfor severely elevated bp's. Has not taken lasix.   - [facility]. Issues getting arranged with transporation.  - EEG and MRI pending by neuro - 3 blackout episodes since last visit.    3. HTN - high bp's recnetly.  - she refuses to lower the dose of her florinef, asks  for higher doses of coreg.    The patient {does/does not:200015} have symptoms concerning for COVID-19 infection (fever, chills, cough, or new shortness of breath).    Past Medical History:  Diagnosis Date  . Anginal pain (HCC)    last time   . Anxiety   . Arthritis    RHEUMATOID  . Asthma   . Bipolar 1 disorder (HCC)   . Cataracts, bilateral 07/2017  . COPD (chronic obstructive pulmonary disease) (HCC)   . Coronary artery disease    reported hx of "MI";  Echo 2009 with normal LVF;  Myoview 05/2011: no ischemia  . Depression   . Dyslipidemia   . Dysrhythmia    SVT  . Esophageal stricture   . Fibromyalgia   . GERD (gastroesophageal reflux disease)   . H/O hiatal hernia   . Head injury, unspecified   . Herpes simplex infection   . History of kidney stones   . Hyperlipidemia   . Hypertension   . Insomnia   . Myocardial infarction [facility](HCC)    age 63  . Osteoporosis   . Pneumonia    hx  . Seizures (HCC)    last 3 weeks ago- prescribed keppra -never took didnt like side effects read about online  . Shortness of breath   . Sleep apnea    ? neg  . Spinal stenosis of lumbar region   . Spondylolisthesis   . Status post placement of implantable loop recorder   . Supraventricular tachycardia (HCC)   . Syncope and collapse    s/p ILR; no  arhythmogenic cause identified  . UTI (lower urinary tract infection)    Past Surgical History:  Procedure Laterality Date  . BACK SURGERY    . BREAST SURGERY     lumpectomy  . CATARACT EXTRACTION W/PHACO Right 07/31/2017   Procedure: CATARACT EXTRACTION PHACO AND INTRAOCULAR LENS PLACEMENT (IOC);  Surgeon: Fabio Pierce, MD;  Location: [facility];  Service: Ophthalmology;  Laterality: Right;  CDE: 2.33  . CATARACT EXTRACTION W/PHACO Left 08/14/2017   Procedure: CATARACT EXTRACTION PHACO AND INTRAOCULAR LENS PLACEMENT (IOC);  Surgeon: Fabio Pierce, MD;  Location: [facility];  Service: Ophthalmology;  Laterality: Left;  CDE: 2.74  . CHOLECYSTECTOMY     . CYSTOSCOPY     stone  . DOPPLER ECHOCARDIOGRAPHY  2009  . head up tilt table testing  06/15/2007   [name]  . HEMORRHOID SURGERY    . insertion of implatable loop recorder  08/11/2007   [name]  . POSTERIOR CERVICAL FUSION/FORAMINOTOMY N/A 12/19/2013   Procedure: RIGHT C3-4.C4-5 AND C5-6 FORAMINOTOMIES;  Surgeon: Kerrin Champagne, MD;  Location: [facility] OR;  Service: Orthopedics;  Laterality: N/A;  . TUBAL LIGATION       No outpatient medications have been marked as taking for the 08/31/18 encounter (Appointment) with Antoine Poche, MD.   Current Facility-Administered Medications for the 08/31/18 encounter (Appointment) with Antoine Poche, MD  Medication  . methylPREDNISolone acetate (DEPO-MEDROL) injection 40 mg     Allergies:   Codeine; Morphine and related; Ambien [zolpidem tartrate]; Lyrica [pregabalin]; and Neurontin [gabapentin]   Social History   Tobacco Use  . Smoking status: Never Smoker  . Smokeless tobacco: Never Used  Substance Use Topics  . Alcohol use: No    Alcohol/week: 0.0 standard drinks  . Drug use: No     Family Hx: The patient's family history includes Alcohol abuse in her brother; Cancer in her father and mother; Cirrhosis in her brother; Colon cancer in her maternal aunt; Diabetes in her brother; Drug abuse in her brother; Heart attack in her brother and father; Heart disease in her brother; Mental illness in her father and mother. There is no history of Stomach cancer.  ROS:   Please see the history of present illness.    *** All other systems reviewed and are negative.   Prior CV studies:   The following studies were reviewed today:  05/2011 Lexiscan MPI No ischemia   Jan 2009 Echo LEFT VENTRICLE: - Left ventricular size was normal. - Overall left ventricular systolic function was normal. - There were no left ventricular regional wall motion abnormalities. - Left ventricular wall thickness was normal.  AORTIC VALVE: - The  aortic valve was trileaflet. - Aortic valve thickness was normal.  AORTA: - The aortic root was normal in size. - The aortic arch was normal.  MITRAL VALVE: - Mitral valve structure was normal.  Doppler interpretation(s): - There was trivial mitral valvular regurgitation.  LEFT ATRIUM: - Left atrial size was normal.  PULMONARY VEINS: - The pulmonary veins were grossly normal.  RIGHT VENTRICLE: - Right ventricular size was normal. - Right ventricular systolic function was normal. - Right ventricular wall thickness was normal.  PULMONIC VALVE: - The structure of the pulmonic valve appeared to be normal.  TRICUSPID VALVE: - The tricuspid valve structure was normal.  Doppler interpretation(s): - There was no significant tricuspid valvular regurgitation.  PULMONARY ARTERY: - The pulmonary artery was normal size.  RIGHT ATRIUM: - Right atrial size was normal.  SYSTEMIC VEINS: -  The inferior vena cava was normal.  PERICARDIUM: - There was no pericardial effusion.  ---------------------------------------------------------------  SUMMARY - Overall left ventricular systolic function was normal. There were no left ventricular regional wall motion abnormalities. - The pulmonary veins were grossly normal.   09/2011 Cath Hemodynamic Findings: Central aortic pressure: 108/58  Left ventricular pressure: 107/10/14  Angiographic Findings:  Left main: No obstructive disease noted.  Left Anterior Descending Artery: Moderate to large sized vessel that courses to the apex. There is a moderate sized diagonal Dondre Catalfamo. No obstructive disease noted.  Circumflex Artery: Large, dominant artery with moderate sized first obtuse marginal Wael Maestas and left sided posterolateral Matea Stanard with no disease noted.  Right Coronary Artery: Small, non-dominant vessel with no disease noted.  Left Ventricular Angiogram:LVEF=65%.  Impression:  1. No angiographic evidence of CAD  2.  Normal LV systolic function  3. Non-cardiac chest pain  Recommendations: No further ischemic workup.  Complications: None. The patient tolerated the procedure well.   11/2014 echo Study Conclusions  - Left ventricle: The cavity size was normal. Systolic function was normal. The estimated ejection fraction was in the range of 55% to 60%. Wall motion was normal; there were no regional wall motion abnormalities. There was an increased relative contribution of atrial contraction to ventricular filling. Doppler parameters are consistent with abnormal left ventricular relaxation (grade 1 diastolic dysfunction). - Mitral valve: There was trivial regurgitation. - Tricuspid valve: There was trivial regurgitation. - Pulmonic valve: There was trivial regurgitation.   09/2017 nuclear stress  There was no ST segment deviation noted during stress. Nonspecific T wave flattening in aVL and V2 seen throughout study.  Defect 1: There is a medium defect of mild severity present in the mid inferior, apical inferior and apical lateral location. This appears to be due to soft tissue attenuation given normal regional wall motion. No ischemic territories.  This is a low risk study.  Nuclear stress EF: 65%.  Labs/Other Tests and Data Reviewed:    EKG:  {EKG/Telemetry Strips Reviewed:[phone]}  Recent Labs: 06/15/2018: ALT 14; BUN 10; Creatinine, Ser 0.80; Potassium 4.3; Sodium 144; TSH 2.560   Recent Lipid Panel Lab Results  Component Value Date/Time   CHOL 161 06/15/2018 12:22 PM   TRIG 101 06/15/2018 12:22 PM   TRIG 233 (H) 07/24/2014 10:40 AM   HDL 47 06/15/2018 12:22 PM   HDL 47 07/24/2014 10:40 AM   CHOLHDL 3.4 06/15/2018 12:22 PM   CHOLHDL 4.2 CALC 04/22/2007 10:12 AM   LDLCALC 94 06/15/2018 12:22 PM   LDLDIRECT 142.1 04/22/2007 10:12 AM    Wt Readings from Last 3 Encounters:  07/19/18 148 lb (67.1 kg)  06/15/18 147 lb (66.7 kg)  05/28/18 151 lb 9.6 oz (68.8 kg)      Objective:    Vital Signs:  There were no vitals taken for this visit.   {[facility] Virtual Exam (Optional):[phone]::"VITAL SIGNS:  reviewed"}  ASSESSMENT & PLAN:    1. Chest pain - long history of symptoms with multiple negative stress tests, most recently7/2019Lexiscan was negative. Cath 2013 with patent vessels -- symptoms improved with imdur. Despite autonomic issues she is not willing to adjust imdur dosing -continue current meds  2. Syncope/Autonomic insufficiency -history of autonomic insufficiency -polypharmacy is likely exacerbating this , asked to discuss with her pcp streamlining her medications.Particularly her zanaflex. She is also on benzos, opiates, imdur, ditropan She is reluctant to lower or adjust imdur. With recent HTN she is not willing to adjust dose of her florinef - given  severe HTN we will increase her prn coreg to 6.25mg  q6hrs prn.  - [facility], provider neccesary documentation that she requires this appointment so she will be able to get transportation.      COVID-19 Education: The signs and symptoms of COVID-19 were discussed with the patient and how to seek care for testing (follow up with PCP or arrange E-visit).  ***The importance of social distancing was discussed today.  Time:   Today, I have spent *** minutes with the patient with telehealth technology discussing the above problems.     Medication Adjustments/Labs and Tests Ordered: Current medicines are reviewed at length with the patient today.  Concerns regarding medicines are outlined above.   Tests Ordered: No orders of the defined types were placed in this encounter.   Medication Changes: No orders of the defined types were placed in this encounter.   Disposition:  Follow up {follow up:15908}  Joanie CoddingtonSigned, Tiyona Desouza, MD  08/31/2018 8:11 AM    [facility]

## 2018-09-09 ENCOUNTER — Other Ambulatory Visit: Payer: Self-pay

## 2018-09-10 ENCOUNTER — Encounter: Payer: Self-pay | Admitting: Nurse Practitioner

## 2018-09-10 ENCOUNTER — Ambulatory Visit (INDEPENDENT_AMBULATORY_CARE_PROVIDER_SITE_OTHER): Payer: 59 | Admitting: Nurse Practitioner

## 2018-09-10 VITALS — BP 158/92 | HR 84 | Temp 98.4°F | Ht 61.0 in | Wt 152.0 lb

## 2018-09-10 DIAGNOSIS — G44229 Chronic tension-type headache, not intractable: Secondary | ICD-10-CM | POA: Diagnosis not present

## 2018-09-10 DIAGNOSIS — F3341 Major depressive disorder, recurrent, in partial remission: Secondary | ICD-10-CM

## 2018-09-10 DIAGNOSIS — K2100 Gastro-esophageal reflux disease with esophagitis, without bleeding: Secondary | ICD-10-CM

## 2018-09-10 DIAGNOSIS — M47812 Spondylosis without myelopathy or radiculopathy, cervical region: Secondary | ICD-10-CM | POA: Diagnosis not present

## 2018-09-10 MED ORDER — HYDROCODONE-ACETAMINOPHEN 7.5-325 MG PO TABS: 1.0000 | ORAL_TABLET | Freq: Two times a day (BID) | ORAL | 0 refills | Status: AC

## 2018-09-10 MED ORDER — HYDROCODONE-ACETAMINOPHEN 7.5-325 MG PO TABS: 1.0000 | ORAL_TABLET | Freq: Two times a day (BID) | ORAL | 0 refills | Status: DC

## 2018-09-10 MED ORDER — BUTALBITAL-APAP-CAFFEINE 50-325-40 MG PO TABS: ORAL_TABLET | ORAL | 0 refills | Status: DC

## 2018-09-10 MED ORDER — DIAZEPAM 5 MG PO TABS: ORAL_TABLET | ORAL | 2 refills | Status: DC

## 2018-09-10 MED ORDER — SUCRALFATE 1 G PO TABS: 1.0000 g | ORAL_TABLET | Freq: Three times a day (TID) | ORAL | 3 refills | Status: DC

## 2018-09-10 NOTE — Patient Instructions (Signed)
Fall Prevention in Hospitals, Adult °Being a patient in the hospital puts you at risk for falling. Falls can cause serious injury and harm, but they can be prevented. It is important to understand what puts you at risk for falling and what you and your health care team can do to prevent you from falling. If you or a loved one falls at the hospital, it is important to tell hospital staff about it. °What increases the risk for falls? °Certain conditions and treatments may increase your risk of falling in the hospital. These include: °· Being in an unfamiliar environment, especially when using the bathroom at night. °· Having surgery. °· Being on bed rest. °· Taking many medicines or certain types of medicines, such as sleeping pills. °· Having tubes in place, such as IV lines or catheters. °Other risk factors for falls in a hospital include: °· Having difficulty with hearing or vision. °· Having a change in thinking or behavior, such as confusion. °· Having depression. °· Having trouble with balance. °· Being a female. °· Feeling dizzy. °· Needing to use the toilet frequently. °· Having fallen during the past three months. °· Having low blood pressure. °What are some strategies for preventing falls? °If you or a loved one has to stay in the hospital: °· Ask about which fall prevention strategies will be in place. Do not hesitate to speak up if you notice that the fall prevention plan has changed. °· Ask for help moving around, especially after surgery or when feeling unwell. °· If you have been asked to call for help when getting up, do not get up by yourself. Asking for help with getting up is for your safety, and the staff is there to help you. °· Wear nonskid footwear. °· Get up slowly, and sit at the side of the bed for a few minutes before standing up. °· Keep items you need, such as the nurse call button or a phone, close to you so that you do not need to reach for them. °· Wear eyeglasses or hearing aids if you  have them. °· Have someone stay in the hospital with you or your loved one. °· Ask if sleeping pills or other medicines that can cause confusion are necessary. °What does the hospital staff do to help prevent falls? °Hospitals have systems in place to prevent falls and accidents, which may involve: °· Discussing your fall risks and making a personalized fall prevention plan. °· Checking in regularly to see if you need help. °· Placing an arm band on your wrist or a sign near your room to alert other staff of your needs. °· Using an alarm on your hospital bed. This is an alarm that goes off if you get out of bed and forget to call for help. °· Keeping the bed in a low and locked position. °· Keeping the area around the bed and bathroom well-lit and free from clutter. °· Keeping your room quiet, so that you can sleep and be well-rested. °· Using safety equipment, such as: °? A belt around your waist. °? Walkers, crutches, and other devices for support. °? Safety beds, such as low beds or cushions on the floor next to the bed. °· Having a staff person stay with you (one-on-one observation), even when you are using the bathroom. This is for your safety. °· Using video monitoring. This allows a staff member to come to help you if you need help. °What other actions can I take to   lower my risk of falls? °· Check in regularly with your health care provider or pharmacist to review all of the medicines that you take. °· Make sure that you have a regular exercise program to stay fit. This will help you maintain your balance. °· Talk with a physical therapist or trainer if recommended by your health care provider. They can help you to improve your strength, balance, and endurance. °· If you are over age 62: °? Ask your health care provider if you need a calcium or vitamin D supplement. °? Have your eyes and hearing checked every year. °? Have your feet checked every year. °Where to find more information °You can find more  information about fall prevention from the Centers for Disease Control and Prevention: www.cdc.gov/steadi °Summary °· Being in an unfamiliar environment, such as the hospital, increases your risk for falling. °· If you have been asked to call for help when getting up, do not get up by yourself. Asking for help with getting up is for your safety, and the staff is there to help you. °· Ask about which fall prevention strategies will be in place. Do not hesitate to speak up if you notice that the fall prevention plan has changed. °· If you or a loved one falls, tell the hospital staff. This is important. °This information is not intended to replace advice given to you by your health care provider. Make sure you discuss any questions you have with your health care provider. °Document Released: 03/14/2000 Document Revised: 10/29/2016 Document Reviewed: 10/29/2016 °Elsevier Interactive Patient Education © 2019 Else[employer] Inc. ° °

## 2018-09-10 NOTE — Progress Notes (Signed)
Subjective:    Patient ID: [name], female    DOB: [birth year], 63 y.o.   MRN: [id number]   Chief Complaint: pain management  HPI Patient comes in for pain management as well as other complaints. 1.- pain management Pain assessment: Cause of pain- lumbosacral spondylosis Pain location- low back pain and neck Pain on scale of 1-10- 8/10 currently Frequency- daily What increases pain-lots of activity What makes pain Better-nothing Effects on ADL - she gets done what he needs to Any change in general medical condition-none  Current opioids rx- noro 7.5/325 BID # meds rx- 60 Effectiveness of current meds-helps- would like to take TID but I do not want her to dueto the other meds she is on Adverse reactions form pain meds-none Morphine equivalent- 15 MEDD  Pill count performed-No Last drug screen - 12/15/17 ( high risk q69m, moderate risk q39m, low risk yearly ) Urine drug screen today- No Was the [place] reviewed- yes  If yes were their any concerning findings? - no   Pain contract signed on: 12/24/17  2. Was taking zantac for GERD but has been taken off market so she needs replacement med. Has had GERD flare up since stopped taking  3. Depression- brother died several months ago and that has increased her depression. Has not seen psych in several months. Having anger issues. Needs valium refilled   Review of Systems  Constitutional: Negative for activity change and appetite change.  HENT: Negative.   Eyes: Negative for pain.  Respiratory: Negative for shortness of breath.   Cardiovascular: Negative for chest pain, palpitations and leg swelling.  Gastrointestinal: Negative for abdominal pain.       Increase in heartburn  Endocrine: Negative for polydipsia.  Genitourinary: Negative.   Skin: Negative for rash.  Neurological: Negative for dizziness, weakness and headaches.  Hematological: Does not bruise/bleed easily.  Psychiatric/Behavioral: Positive for  hallucinations. The patient is nervous/anxious.   All other systems reviewed and are negative.      Objective:   Physical Exam Vitals signs and nursing note reviewed.  Constitutional:      Appearance: Normal appearance.  Cardiovascular:     Rate and Rhythm: Normal rate and regular rhythm.     Heart sounds: Normal heart sounds.  Pulmonary:     Breath sounds: Normal breath sounds.  Abdominal:     General: Abdomen is flat.     Palpations: Abdomen is soft.  Skin:    General: Skin is warm.  Neurological:     General: No focal deficit present.     Mental Status: She is alert and oriented to person, place, and time.  Psychiatric:        Mood and Affect: Mood normal.        Behavior: Behavior normal.     BP (!) 158/92    Pulse 84    Temp 98.4 F (36.9 C) (Oral)    Ht 5\' 1"  (1.549 m)    Wt 152 lb (68.9 kg)    BMI 28.72 kg/m        Assessment & Plan:  [name] in today with chief complaint of Pain   1. Cervical spondylosis without myelopathy Pain meds refilled - HYDROcodone-acetaminophen (NORCO) 7.5-325 MG tablet; Take 1 tablet by mouth 2 (two) times a day for 30 days.  Dispense: 60 tablet; Refill: 0 - HYDROcodone-acetaminophen (NORCO) 7.5-325 MG tablet; Take 1 tablet by mouth 2 (two) times a day for 30 days.  Dispense: 60 tablet; Refill:  0 - HYDROcodone-acetaminophen (NORCO) 7.5-325 MG tablet; Take 1 tablet by mouth 2 (two) times a day for 30 days.  Dispense: 60 tablet; Refill: 0 - diazepam (VALIUM) 5 MG tablet; 1 po bid  Dispense: 60 tablet; Refill: 2  2. Gastroesophageal reflux disease with esophagitis Avoid spicy foods Do not eat 2 hours prior to bedtime - sucralfate (CARAFATE) 1 g tablet; Take 1 tablet (1 g total) by mouth 4 (four) times daily -  with meals and at bedtime.  Dispense: 90 tablet; Refill: 3  3. Recurrent major depressive disorder, in partial remission (HCC) Stress management\ Continue all meds  [name] Daphine Deutscher, FNP

## 2018-09-13 ENCOUNTER — Other Ambulatory Visit: Payer: Self-pay | Admitting: Nurse Practitioner

## 2018-09-15 ENCOUNTER — Other Ambulatory Visit: Payer: Self-pay | Admitting: Nurse Practitioner

## 2018-09-15 DIAGNOSIS — F5101 Primary insomnia: Secondary | ICD-10-CM

## 2018-09-24 ENCOUNTER — Ambulatory Visit: Payer: 59 | Admitting: Nurse Practitioner

## 2018-10-04 ENCOUNTER — Telehealth: Payer: Self-pay

## 2018-10-04 NOTE — Telephone Encounter (Signed)
Spoke to pt and made to [employer] video visit.  Updated her mychart PW.  She will call mychart I gave her # if needed if problems getting on.

## 2018-10-04 NOTE — Progress Notes (Signed)
Virtual Visit via Video Note  I connected with [name] on 10/05/18 at  1:15 PM EDT by a video enabled telemedicine application and verified that I am speaking with the correct person using two identifiers.  Location: Patient: At her home  Provider: In the office    I discussed the limitations of evaluation and management by telemedicine and the availability of in person appointments. The patient expressed understanding and agreed to proceed.  History of Present Illness: 10/05/2018 SS: Ms. Hofstra is a 63- year old female with history of syncope and possible seizures. She had recent EEG study that was normal. She has a loop recorder since 2013. She has had several episodes of syncope associated with low blood pressure. She is on disability for these episodes. She reports episodes of seizures associated with jerking of her arms and legs unassociated with loss of consciousness. She reports she does not have these seizure episodes when she is taking Keppra. MRI of the brain was ordered, but was not completed.   Via Video Visit-She reports she has seizures at night. She describes them as feeling like she can't move. She does not jerk, but her children reports she does move. She doesn't stare off. She does not lose consciousness. She says this isn't her full seizure. She doesn't have events during the day. She hasn't had full seizure event in awhile. She says it could be her psychological issues. She says she tried to have MRI of the brain and couldn't have it done. Her primary care doctor is filling her medications. She says she isn't comfortable going out of her home for appointments due to COVID. She doesn't drive a car. She has widely fluctuating BP causing her to have syncope.   04/06/2018 Dr. Anne Hahn: Ms. Sanda Linger is a 63 year old right-handed white female who was seen through this office in 2014 for episodes of syncope and possible seizures.  The patient apparently in the past has had  ambulatory EEG studies that have shown nonepileptic events.  The patient comes in today describing 2 separate type of episodes.  The patient apparently has been followed through cardiology, she has a loop recorder in place that has been there since 2013.  The patient has 4 or 5 episodes of syncope associated with documented low blood pressure.  The patient will state that she has systolic pressures that go into the 50s and 60s and 70s and then she may blackout.  The patient may be unconscious for about 10 minutes.  The episodes are usually preceded by some slurring of speech.  The patient indicates that she has had these episodes since her 6s.  The patient has been on disability for these episodes for the last 15 years.  The patient also describes events of seizures that are separate from the blackout events.  These episodes are associated with jerking of the arms and legs unassociated with loss of consciousness.  The patient tends not lose control of the bowels or the bladder with these events.  She does report some frequent headaches that she relates to spikes in blood pressure.  She is not clear that the jerking events are associated with headache.  The patient claims that as long she takes her Keppra she does not have these jerking episodes.  She does not operate a motor vehicle.  She reports no focal numbness or weakness of the arms or legs or face.  She is very claustrophobic, she has never had MRI of the brain.  In the past, she apparently was evaluated for sleep apnea, the sleep study done in 2014 did not show significant apnea issues   Observations/Objective: Alert, answers questions appropriately, speech is clear and concise, follows commands, facial symmetry noted, symmetric eye movement, no arm drift, able to perform finger-to-nose.  Gait is mildly unsteady, needs to push off to stand  Assessment and Plan: 1. History of syncope,  2. Report of "seizure"  She never had MRI of the brain as ordered  prior. She does not want to have this done until the [place] pandemic has improved. She will call when she is ready to have MRI of the brain. She is claustrophobic. She will need medication for sedation and possibly open MRI.  EEG was normal.  She remains on Keppra, prescribed by her primary care doctor.  She says when she is on the [place], her seizure events do not occur.  She does describe events that she says happen in the evening, she describes them as she cannot move.  They do not sound like typical seizure events.  She thinks they may be psychological. I would agree with this.  She does not operate a motor vehicle.  She will follow-up in 6 months or sooner if needed.  Follow Up Instructions: 6 months for revisit 04/06/2019 8 am with Dr. Jannifer Franklin    I discussed the assessment and treatment plan with the patient. The patient was provided an opportunity to ask questions and all were answered. The patient agreed with the plan and demonstrated an understanding of the instructions.   The patient was advised to call back or seek an in-person evaluation if the symptoms worsen or if the condition fails to improve as anticipated.  I provided 15 minutes of non-face-to-face time during this encounter.   Evangeline Dakin, DNP  [facility] [address], [place], [place] [address] [phone]

## 2018-10-04 NOTE — Telephone Encounter (Signed)
Patient called to cancel her appointment tomorrow due to her cardiologist advising her that due to [place], she should avoid going to doctor visits if possible. I advised her that we could set her up for a virtual visit to which she agreed and I advised her that someone would reach out to her to go over the process.

## 2018-10-05 ENCOUNTER — Other Ambulatory Visit: Payer: Self-pay

## 2018-10-05 ENCOUNTER — Encounter: Payer: Self-pay | Admitting: Neurology

## 2018-10-05 ENCOUNTER — Telehealth (INDEPENDENT_AMBULATORY_CARE_PROVIDER_SITE_OTHER): Payer: 59 | Admitting: Neurology

## 2018-10-05 DIAGNOSIS — R569 Unspecified convulsions: Secondary | ICD-10-CM

## 2018-10-05 NOTE — Progress Notes (Signed)
I have read the note, and I agree with the clinical assessment and plan.  Charles K Willis   

## 2018-10-07 ENCOUNTER — Other Ambulatory Visit: Payer: Self-pay | Admitting: Nurse Practitioner

## 2018-10-07 ENCOUNTER — Other Ambulatory Visit: Payer: Self-pay | Admitting: Cardiology

## 2018-10-07 ENCOUNTER — Other Ambulatory Visit: Payer: Self-pay | Admitting: Family Medicine

## 2018-10-07 DIAGNOSIS — G44229 Chronic tension-type headache, not intractable: Secondary | ICD-10-CM

## 2018-10-07 DIAGNOSIS — N941 Unspecified dyspareunia: Secondary | ICD-10-CM

## 2018-10-07 DIAGNOSIS — J309 Allergic rhinitis, unspecified: Secondary | ICD-10-CM

## 2018-10-07 DIAGNOSIS — I10 Essential (primary) hypertension: Secondary | ICD-10-CM

## 2018-10-07 DIAGNOSIS — F5101 Primary insomnia: Secondary | ICD-10-CM

## 2018-10-07 DIAGNOSIS — M47812 Spondylosis without myelopathy or radiculopathy, cervical region: Secondary | ICD-10-CM

## 2018-10-07 NOTE — Telephone Encounter (Signed)
Please advise seen 09/13/2018

## 2018-10-11 ENCOUNTER — Other Ambulatory Visit: Payer: Self-pay

## 2018-10-11 ENCOUNTER — Ambulatory Visit: Payer: 59 | Admitting: Nurse Practitioner

## 2018-10-13 ENCOUNTER — Ambulatory Visit: Payer: 59 | Admitting: Nurse Practitioner

## 2018-10-25 ENCOUNTER — Other Ambulatory Visit: Payer: Self-pay

## 2018-10-26 ENCOUNTER — Encounter: Payer: Self-pay | Admitting: Nurse Practitioner

## 2018-10-26 ENCOUNTER — Ambulatory Visit (INDEPENDENT_AMBULATORY_CARE_PROVIDER_SITE_OTHER): Payer: 59 | Admitting: Nurse Practitioner

## 2018-10-26 VITALS — BP 173/102 | HR 83 | Temp 99.1°F | Ht 61.0 in | Wt 151.0 lb

## 2018-10-26 DIAGNOSIS — K2100 Gastro-esophageal reflux disease with esophagitis, without bleeding: Secondary | ICD-10-CM

## 2018-10-26 DIAGNOSIS — R3915 Urgency of urination: Secondary | ICD-10-CM

## 2018-10-26 DIAGNOSIS — R7303 Prediabetes: Secondary | ICD-10-CM

## 2018-10-26 DIAGNOSIS — E785 Hyperlipidemia, unspecified: Secondary | ICD-10-CM | POA: Diagnosis not present

## 2018-10-26 DIAGNOSIS — F5101 Primary insomnia: Secondary | ICD-10-CM

## 2018-10-26 DIAGNOSIS — F331 Major depressive disorder, recurrent, moderate: Secondary | ICD-10-CM

## 2018-10-26 DIAGNOSIS — I10 Essential (primary) hypertension: Secondary | ICD-10-CM

## 2018-10-26 DIAGNOSIS — G4733 Obstructive sleep apnea (adult) (pediatric): Secondary | ICD-10-CM

## 2018-10-26 DIAGNOSIS — R059 Cough, unspecified: Secondary | ICD-10-CM

## 2018-10-26 DIAGNOSIS — M48061 Spinal stenosis, lumbar region without neurogenic claudication: Secondary | ICD-10-CM

## 2018-10-26 DIAGNOSIS — G44229 Chronic tension-type headache, not intractable: Secondary | ICD-10-CM

## 2018-10-26 DIAGNOSIS — K5720 Diverticulitis of large intestine with perforation and abscess without bleeding: Secondary | ICD-10-CM

## 2018-10-26 DIAGNOSIS — F411 Generalized anxiety disorder: Secondary | ICD-10-CM

## 2018-10-26 DIAGNOSIS — F3341 Major depressive disorder, recurrent, in partial remission: Secondary | ICD-10-CM

## 2018-10-26 DIAGNOSIS — R569 Unspecified convulsions: Secondary | ICD-10-CM

## 2018-10-26 DIAGNOSIS — R05 Cough: Secondary | ICD-10-CM

## 2018-10-26 DIAGNOSIS — M47812 Spondylosis without myelopathy or radiculopathy, cervical region: Secondary | ICD-10-CM

## 2018-10-26 MED ORDER — OXYBUTYNIN CHLORIDE 5 MG PO TABS: 5.0000 mg | ORAL_TABLET | Freq: Two times a day (BID) | ORAL | 4 refills | Status: DC

## 2018-10-26 MED ORDER — HYDROCODONE-ACETAMINOPHEN 7.5-325 MG PO TABS: 1.0000 | ORAL_TABLET | Freq: Two times a day (BID) | ORAL | 0 refills | Status: DC

## 2018-10-26 MED ORDER — TOLTERODINE TARTRATE 2 MG PO TABS: ORAL_TABLET | ORAL | 5 refills | Status: DC

## 2018-10-26 MED ORDER — CIPROFLOXACIN HCL 500 MG PO TABS: 500.0000 mg | ORAL_TABLET | Freq: Two times a day (BID) | ORAL | 0 refills | Status: DC

## 2018-10-26 MED ORDER — HYDROCODONE-ACETAMINOPHEN 7.5-325 MG PO TABS: 1.0000 | ORAL_TABLET | Freq: Four times a day (QID) | ORAL | 0 refills | Status: AC | PRN

## 2018-10-26 MED ORDER — BUSPIRONE HCL 10 MG PO TABS: ORAL_TABLET | ORAL | 5 refills | Status: DC

## 2018-10-26 MED ORDER — TRELEGY ELLIPTA 100-62.5-25 MCG/INH IN AEPB: 1.0000 | INHALATION_SPRAY | Freq: Every day | RESPIRATORY_TRACT | 3 refills | Status: DC

## 2018-10-26 MED ORDER — BUTALBITAL-APAP-CAFFEINE 50-325-40 MG PO TABS: 1.0000 | ORAL_TABLET | Freq: Four times a day (QID) | ORAL | 0 refills | Status: DC | PRN

## 2018-10-26 MED ORDER — FLUCONAZOLE 150 MG PO TABS: 150.0000 mg | ORAL_TABLET | Freq: Once | ORAL | 0 refills | Status: AC

## 2018-10-26 MED ORDER — DULOXETINE HCL 60 MG PO CPEP: 60.0000 mg | ORAL_CAPSULE | Freq: Every day | ORAL | 5 refills | Status: DC

## 2018-10-26 MED ORDER — FUROSEMIDE 40 MG PO TABS: 40.0000 mg | ORAL_TABLET | Freq: Every day | ORAL | 5 refills | Status: DC

## 2018-10-26 MED ORDER — ESCITALOPRAM OXALATE 20 MG PO TABS: ORAL_TABLET | ORAL | 5 refills | Status: DC

## 2018-10-26 MED ORDER — TRAZODONE HCL 150 MG PO TABS: 150.0000 mg | ORAL_TABLET | Freq: Every day | ORAL | 5 refills | Status: DC

## 2018-10-26 MED ORDER — METRONIDAZOLE 500 MG PO TABS: 500.0000 mg | ORAL_TABLET | Freq: Two times a day (BID) | ORAL | 0 refills | Status: DC

## 2018-10-26 MED ORDER — ROSUVASTATIN CALCIUM 10 MG PO TABS: ORAL_TABLET | ORAL | 5 refills | Status: DC

## 2018-10-26 MED ORDER — CARVEDILOL 6.25 MG PO TABS: ORAL_TABLET | ORAL | 1 refills | Status: DC

## 2018-10-26 NOTE — Patient Instructions (Signed)
Diverticulitis  Diverticulitis is when small pockets in your large intestine (colon) get infected or swollen. This causes stomach pain and watery poop (diarrhea). These pouches are called diverticula. They form in people who have a condition called diverticulosis. Follow these instructions at home: Medicines  Take over-the-counter and prescription medicines only as told by your doctor. These include: ? Antibiotics. ? Pain medicines. ? Fiber pills. ? Probiotics. ? Stool softeners.  Do not drive or use heavy machinery while taking prescription pain medicine.  If you were prescribed an antibiotic, take it as told. Do not stop taking it even if you feel better. General instructions   Follow a diet as told by your doctor.  When you feel better, your doctor may tell you to change your diet. You may need to eat a lot of fiber. Fiber makes it easier to poop (have bowel movements). Healthy foods with fiber include: ? Berries. ? Beans. ? Lentils. ? Green vegetables.  Exercise 3 or more times a week. Aim for 30 minutes each time. Exercise enough to sweat and make your heart beat faster.  Keep all follow-up visits as told. This is important. You may need to have an exam of the large intestine. This is called a colonoscopy. Contact a doctor if:  Your pain does not get better.  You have a hard time eating or drinking.  You are not pooping like normal. Get help right away if:  Your pain gets worse.  Your problems do not get better.  Your problems get worse very fast.  You have a fever.  You throw up (vomit) more than one time.  You have poop that is: ? Bloody. ? Black. ? Tarry. Summary  Diverticulitis is when small pockets in your large intestine (colon) get infected or swollen.  Take medicines only as told by your doctor.  Follow a diet as told by your doctor. This information is not intended to replace advice given to you by your health care provider. Make sure you  discuss any questions you have with your health care provider. Document Released: 09/03/2007 Document Revised: 02/27/2017 Document Reviewed: 04/03/2016 Elsevier Patient Education  2020 Elsevier Inc.  

## 2018-10-26 NOTE — Progress Notes (Addendum)
Subjective:    Patient ID: [name], female    DOB: 1956-01-13, 63 y.o.   MRN: [id number]   Chief Complaint: No chief complaint on file.    HPI:  1. Essential hypertension No c/o chest pain. She has occasional headaches but are un related to blood pressure  2. Obstructive sleep apnea syndrome Does not have CPAP as of yet to use.  3. Gastroesophageal reflux disease with esophagitis Is on dexilant daily. Working well to keep symptoms under control  4. Hyperlipidemia with target LDL less than 100 Does not watch diet and does no exercise  5. Primary insomnia Takes trazadone to sleep at night. Works well most nights  6. Recurrent major depressive disorder, in partial remission ([place]) Is on lexapro. Denies any medication side effects  Depression screen [facility] 2/9 10/26/2018 09/10/2018 09/10/2018  Decreased Interest 1 1 0  Down, Depressed, Hopeless 1 1 0  PHQ - 2 Score 2 2 0  Altered sleeping 1 2 -  Tired, decreased energy 0 2 -  Change in appetite 0 1 -  Feeling bad or failure about yourself  1 1 -  Trouble concentrating 1 2 -  Moving slowly or fidgety/restless 0 1 -  Suicidal thoughts 0 1 -  PHQ-9 Score 5 12 -  Difficult doing work/chores Somewhat difficult Somewhat difficult -  Some recent data might be hidden      7. Seizures ([place]) Has not had one since last visit that sheis aware of.  8. GAD (generalized anxiety disorder) She stays stressed. Takes buspar a couple of times a day as needed  9. Pre-diabetes Does not watch diet and does not check blood sugars at home.  10. SPINAL STENOSIS OF LUMBAR REGION Has constant back pain. Pain assessment: Cause of pain- spondylosis Pain location- lower back Pain on scale of 1-10- 7/10 Frequency- daily What increases pain-to much activity What makes pain Better-rest Effects on ADL - none Any change in general medical condition-none  Current opioids rx- norco 7.5/325 # meds rx- 60 Effectiveness of current  meds-helps but alwayc has some pain Adverse reactions form pain meds-none Morphine equivalent- 15 MEDD  Pill count performed-No Last drug screen - 11/2017 ( high risk q64m moderate risk q667mlow risk yearly ) Urine drug screen today- Yes [place]  If yes were their any concerning findings? - none    Pain contract signed on: 12/24/17 .    Outpatient Encounter Medications as of 10/26/2018  Medication Sig  . acyclovir ointment (ZOVIRAX) 5 % APPLY TO AFFECTED AREA EVERY 3 HOURS  . albuterol (PROVENTIL) (2.5 MG/3ML) 0.083% nebulizer solution USE 1 VIAL IN NEBULIZER EVERY 4 HOURS AS NEEDED FOR WHEEZING.  . Marland Kitchenlbuterol (VENTOLIN HFA) 108 (90 Base) MCG/ACT inhaler INHALE 2 PUFFS EVERY 6 HOURS AS NEEDED FOR SHORTNESS OF BREATH AND WHEEZING.  . Marland Kitchenlcohol Swabs (GLOBAL ALCOHOL PREP EASE) 70 % PADS USE 1 PAD DAILY WHEN CHECKING BLOOD SUGAR. Dx E11.9  . BREO ELLIPTA 200-25 MCG/INH AEPB INHALE 1 PUFF DAILY.  . busPIRone (BUSPAR) 10 MG tablet 1 po bid prn  . butalbital-acetaminophen-caffeine (FIORICET) 50-325-40 MG tablet TAKE 1 TABLET BY MOUTH EVERY 6 HOURS AS NEEDED FOR HEADACHE.  . carvedilol (COREG) 6.25 MG tablet TAKE (1) TABLET BY MOUTH FOUR TIMES DAILY AS NEEDED.  . Marland Kitchenexlansoprazole (DEXILANT) 60 MG capsule Take 1 capsule (60 mg total) by mouth daily.  . diazepam (VALIUM) 5 MG tablet TAKE (1) TABLET TWICE DAILY.  . DULoxetine (CYMBALTA)  60 MG capsule Take 1 capsule (60 mg total) by mouth at bedtime.  [name] escitalopram (LEXAPRO) 20 MG tablet TAKE (1) TABLET BY MOUTH ONCE DAILY.  [name] estradiol (ESTRACE) 0.1 MG/GM vaginal cream PLACE 1 APPLICATORFUL VAGINALLY AT BEDTIME.  . fludrocortisone (FLORINEF) 0.1 MG tablet TAKE 2 TABLETS BY MOUTH ONCE DAILY.  . fluticasone (FLONASE) 50 MCG/ACT nasal spray 2 SPRAYS INTO BOTH NOSTRILS DAILY.  [name] Fluticasone-Umeclidin-Vilant (TRELEGY ELLIPTA) 100-62.5-25 MCG/INH AEPB Inhale 1 puff into the lungs daily.  . furosemide (LASIX) 40 MG tablet TAKE 1  TABLET AS NEEDED FOR SWELLING.  Derrill Memo ON 11/12/2018] HYDROcodone-acetaminophen (NORCO) 7.5-325 MG tablet Take 1 tablet by mouth 2 (two) times a day for 30 days.  [name] HYDROcodone-acetaminophen (NORCO) 7.5-325 MG tablet Take 1 tablet by mouth 2 (two) times a day for 30 days.  [name] ipratropium (ATROVENT) 0.02 % nebulizer solution USE 1 VIAL IN NEBULIZER EVERY 4 HOURS AS NEEDED FOR WHEEZING.  . isosorbide mononitrate (IMDUR) 30 MG 24 hr tablet Take 1 tablet (30 mg total) by mouth daily.  [name] lamoTRIgine (LAMICTAL) 150 MG tablet TAKE 1 TABLET BY MOUTH ONCE DAILY.  [name] levETIRAcetam (KEPPRA) 500 MG tablet TAKE (1) TABLET TWICE DAILY.  [name] levocetirizine (XYZAL) 5 MG tablet TAKE 1 TABLET BY MOUTH EVERY MORNING.  . nitroGLYCERIN (NITROSTAT) 0.4 MG SL tablet PLACE ONE (1) TABLET UNDER TONGUE EVERY 5 MINUTES UP TO (3) DOSES AS NEEDED FOR CHEST PAIN.  [name] ondansetron (ZOFRAN) 4 MG tablet TAKE 1 TABLET BY MOUTH EVERY 8 HOURS AS NEEDED FOR NAUSEA OR VOMITING.  Glory Rosebush DELICA LANCETS 86V MISC TEST BLOOD SUGAR ONCE DAILY.  [name] ONETOUCH VERIO test strip USE TO TEST BLOOD SUGAR ONCE A DAY.  [name] oxybutynin (DITROPAN) 5 MG tablet TAKE (1) TABLET TWICE DAILY.  . rosuvastatin (CRESTOR) 10 MG tablet TAKE (1) TABLET BY MOUTH ONCE DAILY.  [name] sucralfate (CARAFATE) 1 g tablet Take 1 tablet (1 g total) by mouth 4 (four) times daily -  with meals and at bedtime.  [name] tiZANidine (ZANAFLEX) 4 MG tablet TAKE (1) TABLET EVERY SIX HOURS AS NEEDED FOR MUSCLE SPASMS.  [name] tolterodine (DETROL) 2 MG tablet TAKE (1) TABLET TWICE DAILY.  . traZODone (DESYREL) 150 MG tablet TAKE 1 TABLET BY MOUTH AT BEDTIME.  . valACYclovir (VALTREX) 500 MG tablet TAKE (1) TABLET TWICE DAILY.     Past Surgical History:  Procedure Laterality Date  . BACK SURGERY    . BREAST SURGERY     lumpectomy  . CATARACT EXTRACTION W/PHACO Right 07/31/2017   Procedure: CATARACT EXTRACTION PHACO AND INTRAOCULAR LENS PLACEMENT (IOC);  Surgeon: [name], MD;  Location: [facility];  Service: Ophthalmology;  Laterality: Right;  CDE: 2.33  . CATARACT EXTRACTION W/PHACO Left 08/14/2017   Procedure: CATARACT EXTRACTION PHACO AND INTRAOCULAR LENS PLACEMENT (IOC);  Surgeon: [name], MD;  Location: [facility];  Service: Ophthalmology;  Laterality: Left;  CDE: 2.74  . CHOLECYSTECTOMY    . CYSTOSCOPY     stone  . DOPPLER ECHOCARDIOGRAPHY  2009  . head up tilt table testing  06/15/2007   [name]  . HEMORRHOID SURGERY    . insertion of implatable loop recorder  08/11/2007   [name]  . POSTERIOR CERVICAL FUSION/FORAMINOTOMY N/A 12/19/2013   Procedure: RIGHT C3-4.C4-5 AND C5-6 FORAMINOTOMIES;  Surgeon: [name], MD;  Location: [facility];  Service: Orthopedics;  Laterality: N/A;  . TUBAL LIGATION      Family History  Problem Relation Age of Onset  .  Heart attack Father   . Cancer Father   . Mental illness Father   . Cancer Mother   . Mental illness Mother   . Heart attack Brother        stents  . Alcohol abuse Brother   . Heart disease Brother   . Drug abuse Brother   . Diabetes Brother   . Colon cancer Maternal Aunt   . Cirrhosis Brother   . Stomach cancer Neg Hx     New complaints: Having flare up of diverticulitis started having pain 2 days ago. Pain has worsened .  Social history: Lives alone now and is loving it.     Review of Systems  Constitutional: Negative for activity change and appetite change.  HENT: Negative.   Eyes: Negative for pain.  Respiratory: Negative for shortness of breath.   Cardiovascular: Negative for chest pain, palpitations and leg swelling.  Gastrointestinal: Positive for abdominal pain.  Endocrine: Negative for polydipsia.  Genitourinary: Negative.   Musculoskeletal: Positive for arthralgias and back pain.  Skin: Negative for rash.  Neurological: Positive for headaches. Negative for dizziness and weakness.  Hematological: Does not bruise/bleed easily.  Psychiatric/Behavioral: Negative.   All other systems  reviewed and are negative.      Objective:   Physical Exam Vitals signs and nursing note reviewed.  Constitutional:      General: She is not in acute distress.    Appearance: Normal appearance. She is well-developed.  HENT:     Head: Normocephalic.     Nose: Nose normal.  Eyes:     Pupils: Pupils are equal, round, and reactive to light.  Neck:     Musculoskeletal: Normal range of motion and neck supple.     Vascular: No carotid bruit or JVD.  Cardiovascular:     Rate and Rhythm: Normal rate and regular rhythm.     Heart sounds: Normal heart sounds.  Pulmonary:     Effort: Pulmonary effort is normal. No respiratory distress.     Breath sounds: Normal breath sounds. No wheezing or rales.  Chest:     Chest wall: No tenderness.  Abdominal:     General: Bowel sounds are normal. There is no distension or abdominal bruit.     Palpations: Abdomen is soft. There is no hepatomegaly, splenomegaly, mass or pulsatile mass.     Tenderness: There is abdominal tenderness (LLQ). There is no guarding.     Hernia: No hernia is present.  Musculoskeletal: Normal range of motion.     Comments: Rises slowly form sitting to standing   Lymphadenopathy:     Cervical: No cervical adenopathy.  Skin:    General: Skin is warm and dry.  Neurological:     Mental Status: She is alert and oriented to person, place, and time.     Deep Tendon Reflexes: Reflexes are normal and symmetric.  Psychiatric:        Behavior: Behavior normal.        Thought Content: Thought content normal.        Judgment: Judgment normal.    BP (!) 173/102   Pulse 83   Temp 99.1 F (37.3 C) (Oral)   Ht 5' 1"  (1.549 m)   Wt 151 lb (68.5 kg)   BMI 28.53 kg/m         Assessment & Plan:  [name] comes in today with chief complaint of Medical Management of Chronic Issues   Diagnosis and orders addressed:  1. Essential hypertension Low  sodium diet - CMP14+EGFR - carvedilol (COREG) 6.25 MG tablet; TAKE  (1) TABLET BY MOUTH FOUR TIMES DAILY AS NEEDED.  Dispense: 120 tablet; Refill: 1 - furosemide (LASIX) 40 MG tablet; Take 1 tablet (40 mg total) by mouth daily.  Dispense: 30 tablet; Refill: 5  2. Obstructive sleep apnea syndrome Get CPAP as soon as can- waiting on cardiologist to get for her  3. Gastroesophageal reflux disease with esophagitis Avoid spicy foods Do not eat 2 hours prior to bedtime  4. Hyperlipidemia with target LDL less than 100 Low fat diet encouraged - Lipid panel - rosuvastatin (CRESTOR) 10 MG tablet; TAKE (1) TABLET BY MOUTH ONCE DAILY.  Dispense: 30 tablet; Refill: 5  5. Primary insomnia Bedtime routine - traZODone (DESYREL) 150 MG tablet; Take 1 tablet (150 mg total) by mouth at bedtime.  Dispense: 30 tablet; Refill: 5  6. Recurrent major depressive disorder, in partial remission ([place]) Continue meds and stress maagement  7. Seizures (H[facility]) Continue keppra and lamictal Keep diay of nay episodes  8. GAD (generalized anxiety disorder) Stress management  9. Pre-diabetes  10. SPINAL STENOSIS OF LUMBAR REGION - ToxASSURE Select 13 (MW), Urine  11. Cervical spondylosis without myelopathy - HYDROcodone-acetaminophen (NORCO) 7.5-325 MG tablet; Take 1 tablet by mouth 2 (two) times a day.  Dispense: 60 tablet; Refill: 0 - HYDROcodone-acetaminophen (NORCO) 7.5-325 MG tablet; Take 1 tablet by mouth 2 (two) times a day.  Dispense: 60 tablet; Refill: 0  12. Anxiety state - busPIRone (BUSPAR) 10 MG tablet; 1 po bid prn  Dispense: 60 tablet; Refill: 5  13. Chronic tension-type headache, not intractable - butalbital-acetaminophen-caffeine (FIORICET) 50-325-40 MG tablet; Take 1 tablet by mouth every 6 (six) hours as needed for headache.  Dispense: 20 tablet; Refill: 0  14. Cough - Fluticasone-Umeclidin-Vilant (TRELEGY ELLIPTA) 100-62.5-25 MCG/INH AEPB; Inhale 1 puff into the lungs daily.  Dispense: 1 each; Refill: 3  15. Moderate episode of recurrent major depressive  disorder (HCC) - DULoxetine (CYMBALTA) 60 MG capsule; Take 1 capsule (60 mg total) by mouth at bedtime.  Dispense: 30 capsule; Refill: 5 - escitalopram (LEXAPRO) 20 MG tablet; TAKE (1) TABLET BY MOUTH ONCE DAILY.  Dispense: 30 tablet; Refill: 5  16. Urinary urgency - tolterodine (DETROL) 2 MG tablet; TAKE (1) TABLET TWICE DAILY.  Dispense: 60 tablet; Refill: 5  17. Diverticulitis of large intestine with perforation without abscess or bleeding - ciprofloxacin (CIPRO) 500 MG tablet; Take 1 tablet (500 mg total) by mouth 2 (two) times daily.  Dispense: 20 tablet; Refill: 0 - metroNIDAZOLE (FLAGYL) 500 MG tablet; Take 1 tablet (500 mg total) by mouth 2 (two) times daily.  Dispense: 20 tablet; Refill: 0   Labs pending Health Maintenance reviewed Diet and exercise encouraged  Follow up plan: 4 months   [name] Hassell Done, FNP

## 2018-10-27 LAB — CMP14+EGFR
ALT: 8 IU/L (ref 0–32)
AST: 17 IU/L (ref 0–40)
Albumin/Globulin Ratio: 1.6 (ref 1.2–2.2)
Albumin: 4.2 g/dL (ref 3.8–4.8)
Alkaline Phosphatase: 116 IU/L (ref 39–117)
BUN/Creatinine Ratio: 9 — ABNORMAL LOW (ref 12–28)
BUN: 7 mg/dL — ABNORMAL LOW (ref 8–27)
Bilirubin Total: 0.2 mg/dL (ref 0.0–1.2)
CO2: 25 mmol/L (ref 20–29)
Calcium: 9.4 mg/dL (ref 8.7–10.3)
Chloride: 101 mmol/L (ref 96–106)
Creatinine, Ser: 0.77 mg/dL (ref 0.57–1.00)
GFR calc Af Amer: 96 mL/min/{1.73_m2} (ref >59)
GFR calc non Af Amer: 83 mL/min/{1.73_m2} (ref >59)
Globulin, Total: 2.6 g/dL (ref 1.5–4.5)
Glucose: 97 mg/dL (ref 65–99)
Potassium: 3.8 mmol/L (ref 3.5–5.2)
Sodium: 144 mmol/L (ref 134–144)
Total Protein: 6.8 g/dL (ref 6.0–8.5)

## 2018-10-27 LAB — LIPID PANEL
Chol/HDL Ratio: 3.4 ratio (ref 0.0–4.4)
Cholesterol, Total: 169 mg/dL (ref 100–199)
HDL: 50 mg/dL (ref >39)
LDL Calculated: 89 mg/dL (ref 0–99)
Triglycerides: 151 mg/dL — ABNORMAL HIGH (ref 0–149)
VLDL Cholesterol Cal: 30 mg/dL (ref 5–40)

## 2018-10-29 LAB — TOXASSURE SELECT 13 (MW), URINE

## 2018-11-08 ENCOUNTER — Other Ambulatory Visit: Payer: Self-pay | Admitting: Nurse Practitioner

## 2018-11-08 DIAGNOSIS — N941 Unspecified dyspareunia: Secondary | ICD-10-CM

## 2018-11-09 ENCOUNTER — Other Ambulatory Visit: Payer: Self-pay | Admitting: Nurse Practitioner

## 2018-11-09 DIAGNOSIS — K2100 Gastro-esophageal reflux disease with esophagitis, without bleeding: Secondary | ICD-10-CM

## 2018-11-09 DIAGNOSIS — B009 Herpesviral infection, unspecified: Secondary | ICD-10-CM

## 2018-11-09 DIAGNOSIS — F411 Generalized anxiety disorder: Secondary | ICD-10-CM

## 2018-11-09 DIAGNOSIS — F331 Major depressive disorder, recurrent, moderate: Secondary | ICD-10-CM

## 2018-11-09 DIAGNOSIS — M47812 Spondylosis without myelopathy or radiculopathy, cervical region: Secondary | ICD-10-CM

## 2018-11-17 ENCOUNTER — Ambulatory Visit (INDEPENDENT_AMBULATORY_CARE_PROVIDER_SITE_OTHER): Payer: 59 | Admitting: Family Medicine

## 2018-11-17 DIAGNOSIS — B009 Herpesviral infection, unspecified: Secondary | ICD-10-CM | POA: Diagnosis not present

## 2018-11-17 DIAGNOSIS — A6004 Herpesviral vulvovaginitis: Secondary | ICD-10-CM | POA: Diagnosis not present

## 2018-11-17 MED ORDER — LIDOCAINE 5 % EX OINT: 1.0000 "application " | TOPICAL_OINTMENT | Freq: Three times a day (TID) | CUTANEOUS | 0 refills | Status: DC | PRN

## 2018-11-17 MED ORDER — VALACYCLOVIR HCL 500 MG PO TABS: ORAL_TABLET | ORAL | 0 refills | Status: DC

## 2018-11-17 NOTE — Progress Notes (Signed)
Telephone visit  Subjective: CC: herpes outbreak PCP: Bennie Pierini, FNP HFS:FSELTRVU B [name] is a 63 y.o. female calls for telephone consult today. Patient provides verbal consent for consult held via phone.  Location of patient: home Location of provider: [facility] Others present for call: none  1. Herpes outbreak Patient reports that she has had a herpes outbreak over the last week.  She has been taking 1 g of her Valtrex twice daily but she has not seen improvement or resolution in the herpes outbreak.  She notes that she has not had a genital outbreak in several years.  She is on chronic suppressive therapy.  She notes pain from the lesions.  She is worried because she is going to run out of her Valtrex sooner because of the doubling up on dose.   ROS: Per HPI  Allergies  Allergen Reactions  . Codeine Other (See Comments)    "I will have a heart attack."  . Morphine And Related Other (See Comments)    "It will cause me to have a heart attack."  . Ambien [Zolpidem Tartrate] Nausea And Vomiting  . Lyrica [Pregabalin] Swelling and Other (See Comments)    Weight gain  . Neurontin [Gabapentin] Other (See Comments)    Causes elevated LFTs    Past Medical History:  Diagnosis Date  . Anginal pain (HCC)    last time   . Anxiety   . Arthritis    RHEUMATOID  . Asthma   . Bipolar 1 disorder (HCC)   . Cataracts, bilateral 07/2017  . COPD (chronic obstructive pulmonary disease) (HCC)   . Coronary artery disease    reported hx of "MI";  Echo 2009 with normal LVF;  Myoview 05/2011: no ischemia  . Depression   . Dyslipidemia   . Dysrhythmia    SVT  . Esophageal stricture   . Fibromyalgia   . GERD (gastroesophageal reflux disease)   . H/O hiatal hernia   . Head injury, unspecified   . Herpes simplex infection   . History of kidney stones   . Hyperlipidemia   . Hypertension   . Insomnia   . Myocardial infarction [facility]    age 56  . Osteoporosis   . Pneumonia    hx  .  Seizures (HCC)    last 3 weeks ago- prescribed keppra -never took didnt like side effects read about online  . Shortness of breath   . Sleep apnea    ? neg  . Spinal stenosis of lumbar region   . Spondylolisthesis   . Status post placement of implantable loop recorder   . Supraventricular tachycardia (HCC)   . Syncope and collapse    s/p ILR; no arhythmogenic cause identified  . UTI (lower urinary tract infection)     Current Outpatient Medications:  .  acyclovir ointment (ZOVIRAX) 5 %, APPLY TO AFFECTED AREA EVERY 3 HOURS, Disp: 30 g, Rfl: 0 .  albuterol (PROVENTIL) (2.5 MG/3ML) 0.083% nebulizer solution, USE 1 VIAL IN NEBULIZER EVERY 4 HOURS AS NEEDED FOR WHEEZING., Disp: 180 mL, Rfl: 0 .  albuterol (VENTOLIN HFA) 108 (90 Base) MCG/ACT inhaler, INHALE 2 PUFFS EVERY 6 HOURS AS NEEDED FOR SHORTNESS OF BREATH AND WHEEZING., Disp: 8.5 g, Rfl: 0 .  Alcohol Swabs (GLOBAL ALCOHOL PREP EASE) 70 % PADS, USE 1 PAD DAILY WHEN CHECKING BLOOD SUGAR. Dx E11.9, Disp: 100 each, Rfl: 3 .  BREO ELLIPTA 200-25 MCG/INH AEPB, INHALE 1 PUFF DAILY., Disp: 60 each, Rfl: 0 .  busPIRone (  BUSPAR) 10 MG tablet, 1 po bid prn, Disp: 60 tablet, Rfl: 5 .  butalbital-acetaminophen-caffeine (FIORICET) 50-325-40 MG tablet, Take 1 tablet by mouth every 6 (six) hours as needed for headache., Disp: 20 tablet, Rfl: 0 .  carvedilol (COREG) 6.25 MG tablet, TAKE (1) TABLET BY MOUTH FOUR TIMES DAILY AS NEEDED., Disp: 120 tablet, Rfl: 1 .  ciprofloxacin (CIPRO) 500 MG tablet, Take 1 tablet (500 mg total) by mouth 2 (two) times daily., Disp: 20 tablet, Rfl: 0 .  DEXILANT 60 MG capsule, TAKE 1 CAPSULE BY MOUTH ONCE DAILY., Disp: 30 capsule, Rfl: 5 .  diazepam (VALIUM) 5 MG tablet, TAKE (1) TABLET TWICE DAILY., Disp: 60 tablet, Rfl: 0 .  DULoxetine (CYMBALTA) 60 MG capsule, Take 1 capsule (60 mg total) by mouth at bedtime., Disp: 30 capsule, Rfl: 5 .  escitalopram (LEXAPRO) 20 MG tablet, TAKE (1) TABLET BY MOUTH ONCE DAILY., Disp: 30  tablet, Rfl: 5 .  estradiol (ESTRACE) 0.1 MG/GM vaginal cream, PLACE 1 APPLICATORFUL VAGINALLY AT BEDTIME., Disp: 42.5 g, Rfl: 0 .  fludrocortisone (FLORINEF) 0.1 MG tablet, TAKE 2 TABLETS BY MOUTH ONCE DAILY., Disp: 60 tablet, Rfl: 6 .  fluticasone (FLONASE) 50 MCG/ACT nasal spray, 2 SPRAYS INTO BOTH NOSTRILS DAILY., Disp: 16 g, Rfl: 0 .  Fluticasone-Umeclidin-Vilant (TRELEGY ELLIPTA) 100-62.5-25 MCG/INH AEPB, Inhale 1 puff into the lungs daily., Disp: 1 each, Rfl: 3 .  furosemide (LASIX) 40 MG tablet, Take 1 tablet (40 mg total) by mouth daily., Disp: 30 tablet, Rfl: 5 .  [START ON 02/10/2019] HYDROcodone-acetaminophen (NORCO) 7.5-325 MG tablet, Take 1 tablet by mouth 2 (two) times a day., Disp: 60 tablet, Rfl: 0 .  [START ON 01/11/2019] HYDROcodone-acetaminophen (NORCO) 7.5-325 MG tablet, Take 1 tablet by mouth 2 (two) times a day., Disp: 60 tablet, Rfl: 0 .  [START ON 12/12/2018] HYDROcodone-acetaminophen (NORCO) 7.5-325 MG tablet, Take 1 tablet by mouth every 6 (six) hours as needed for moderate pain., Disp: 60 tablet, Rfl: 0 .  ipratropium (ATROVENT) 0.02 % nebulizer solution, USE 1 VIAL IN NEBULIZER EVERY 4 HOURS AS NEEDED FOR WHEEZING., Disp: 150 mL, Rfl: 1 .  isosorbide mononitrate (IMDUR) 30 MG 24 hr tablet, Take 1 tablet (30 mg total) by mouth daily., Disp: 30 tablet, Rfl: 6 .  lamoTRIgine (LAMICTAL) 150 MG tablet, TAKE 1 TABLET BY MOUTH ONCE DAILY., Disp: 30 tablet, Rfl: 5 .  levETIRAcetam (KEPPRA) 500 MG tablet, TAKE (1) TABLET TWICE DAILY., Disp: 60 tablet, Rfl: 5 .  levocetirizine (XYZAL) 5 MG tablet, TAKE 1 TABLET BY MOUTH EVERY MORNING., Disp: 30 tablet, Rfl: 5 .  metroNIDAZOLE (FLAGYL) 500 MG tablet, Take 1 tablet (500 mg total) by mouth 2 (two) times daily., Disp: 20 tablet, Rfl: 0 .  nitroGLYCERIN (NITROSTAT) 0.4 MG SL tablet, PLACE ONE (1) TABLET UNDER TONGUE EVERY 5 MINUTES UP TO (3) DOSES AS NEEDED FOR CHEST PAIN., Disp: 25 tablet, Rfl: 0 .  ondansetron (ZOFRAN) 4 MG tablet,  TAKE 1 TABLET BY MOUTH EVERY 8 HOURS AS NEEDED FOR NAUSEA AND VOMITING., Disp: 20 tablet, Rfl: 0 .  ONETOUCH DELICA LANCETS 93A MISC, TEST BLOOD SUGAR ONCE DAILY., Disp: 100 each, Rfl: 3 .  ONETOUCH VERIO test strip, USE TO TEST BLOOD SUGAR ONCE A DAY., Disp: 50 strip, Rfl: 0 .  oxybutynin (DITROPAN) 5 MG tablet, Take 1 tablet (5 mg total) by mouth 2 (two) times daily., Disp: 60 tablet, Rfl: 4 .  rosuvastatin (CRESTOR) 10 MG tablet, TAKE (1) TABLET BY MOUTH ONCE DAILY., Disp: 30  tablet, Rfl: 5 .  sucralfate (CARAFATE) 1 g tablet, Take 1 tablet (1 g total) by mouth 4 (four) times daily -  with meals and at bedtime., Disp: 90 tablet, Rfl: 3 .  tiZANidine (ZANAFLEX) 4 MG tablet, TAKE (1) TABLET EVERY SIX HOURS AS NEEDED FOR MUSCLE SPASMS., Disp: 30 tablet, Rfl: 0 .  tolterodine (DETROL) 2 MG tablet, TAKE (1) TABLET TWICE DAILY., Disp: 60 tablet, Rfl: 5 .  traZODone (DESYREL) 150 MG tablet, Take 1 tablet (150 mg total) by mouth at bedtime., Disp: 30 tablet, Rfl: 5 .  valACYclovir (VALTREX) 500 MG tablet, TAKE (1) TABLET TWICE DAILY., Disp: 60 tablet, Rfl: 5  Current Facility-Administered Medications:  .  methylPREDNISolone acetate (DEPO-MEDROL) injection 40 mg, 40 mg, Intra-articular, Once, Hilts, Michael, MD  Assessment/ Plan: 63 y.o. female   1. Herpes simplex vulvovaginitis I have adjusted her prescription to reflect her current use.  I advised her to go ahead and keep taking it for another for 5 days.  If no resolution I would recommend that she come into the office for evaluation.  I do question immunosuppression given high dose of Valtrex without significant improvement.  Could consider rechecking HIV or evaluating for other reasons for immunosuppression.  May need to consider referral to GYN versus checking for other STIs.  I have also prescribed her lidocaine to apply to the affected areas for pain. - valACYclovir (VALTREX) 500 MG tablet; Take 1 tablet twice daily.  May take 2 tablets twice  daily if needed for outbreak x7-10 days.  Dispense: 80 tablet; Refill: 0 - lidocaine (XYLOCAINE) 5 % ointment; Apply 1 application topically 3 (three) times daily as needed. (pain)  Dispense: 35.44 g; Refill: 0  2. Herpes simplex virus (HSV) infection - valACYclovir (VALTREX) 500 MG tablet; Take 1 tablet twice daily.  May take 2 tablets twice daily if needed for outbreak x7-10 days.  Dispense: 80 tablet; Refill: 0 - lidocaine (XYLOCAINE) 5 % ointment; Apply 1 application topically 3 (three) times daily as needed. (pain)  Dispense: 35.44 g; Refill: 0   Start time: 11:36am End time: 11:40am  Total time spent on patient care (including telephone call/ virtual visit): 12 minutes  Ashly Hulen Skains, DO [facility] [phone]

## 2018-11-18 ENCOUNTER — Telehealth: Payer: Self-pay | Admitting: Physical Medicine and Rehabilitation

## 2018-11-19 NOTE — Telephone Encounter (Signed)
Scheduled for bilateral bursa injections with Dr. Ernestina Patches on Friday 8/28 and for right shoulder with Dr. Junius Roads on Monday 8/24.

## 2018-11-19 NOTE — Telephone Encounter (Signed)
Hilts for shoulder would be ok and we can do bursa?

## 2018-11-22 ENCOUNTER — Encounter: Payer: Self-pay | Admitting: Family Medicine

## 2018-11-22 ENCOUNTER — Ambulatory Visit (INDEPENDENT_AMBULATORY_CARE_PROVIDER_SITE_OTHER): Payer: 59 | Admitting: Family Medicine

## 2018-11-22 DIAGNOSIS — M25511 Pain in right shoulder: Secondary | ICD-10-CM | POA: Diagnosis not present

## 2018-11-22 DIAGNOSIS — G8929 Other chronic pain: Secondary | ICD-10-CM

## 2018-11-22 NOTE — Progress Notes (Signed)
Office Visit Note   Patient: VY BADLEY           Date of Birth: 1955-07-08           MRN: [id number] Visit Date: 11/22/2018 Requested by: Bennie Pierini, FNP [address] [place],  [place] [address] PCP: Bennie Pierini, FNP  Subjective: Chief Complaint  Patient presents with  . Right Shoulder - Pain    Pain in the right shoulder. Had cortisone injection by her PCP 3 months ago - got no relief from this. Pain is in anterior aspect of the shoulder. Started after her dog used her arm as a springboard 3 months ago.    HPI: She is here with recurrent right shoulder pain.  In April we did a glenohumeral injection and for 2 months she was completely pain-free but then 1 day her dog was in her lap, jumped down and pushed off against the front of her shoulder and it has been hurting since then.  She went to her PCP who gave her a subacromial injection which apparently has not helped.  Patient states that her shoulder hurts on the anterior aspect and she would like me to do an injection there.              ROS: No fevers or chills.  All other systems were reviewed and are negative.  Objective: Vital Signs: There were no vitals taken for this visit.  Physical Exam:  General:  Alert and oriented, in no acute distress. Pulm:  Breathing unlabored. Psy:  Normal mood, congruent affect. Skin: No rash or bruising. Right shoulder: Very limited active and passive range of motion.  She is tender near the long head biceps tendon.  No tenderness at the [facility] joint.  Pain with internal rotation against resistance, not a lot of pain with external rotation.  Imaging: Limited diagnostic ultrasound shows fluid in the long head biceps tendon sheath.  Assessment & Plan: 1.  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 -We will inject the biceps tendon today.  If this  does not help by the end of the week, she will come back for a posterior approach ultrasound-guided glenohumeral injection.     Procedures: Ultrasound-guided right long head biceps tendon injection: After sterile prep with Betadine, injected 3 cc 1% lidocaine without epinephrine and 40 mg methylprednisolone into the long head biceps tendon sheath.  She did not have a lot of immediate pain relief.   PMFS History: Patient Active Problem List   Diagnosis Date Noted  . Unstable angina (HCC) 10/20/2017  . Encounter for chronic pain management 09/23/2016  . Pre-diabetes 09/02/2016  . Recurrent major depressive disorder, in partial remission (HCC) 07/14/2016  . Seizures (HCC) 07/14/2016  . GAD (generalized anxiety disorder) 07/14/2016  . Insomnia 09/05/2014  . Allergic rhinitis 09/05/2014  . Cervical spondylosis without myelopathy 12/19/2013    Class: Chronic  . Neural foraminal stenosis of cervical spine 12/19/2013  . Cervical radiculitis 09/19/2013  . Lumbosacral spondylosis without myelopathy 11/16/2012  . Postlaminectomy syndrome, lumbar region 11/16/2012  . Herpes simplex virus (HSV) infection 10/31/2008  . Hyperlipidemia with target LDL less than 100 10/31/2008  . Essential hypertension 10/31/2008  . GERD 10/31/2008  . SPINAL STENOSIS OF LUMBAR REGION 10/31/2008  . Myalgia 10/31/2008  . Osteoporosis 10/31/2008  . SPONDYLOLISTHESIS 10/31/2008  . SYNCOPE 10/31/2008  . Sleep apnea 10/31/2008  Past Medical History:  Diagnosis Date  . Anginal pain ([place])    last time   . Anxiety   . Arthritis    RHEUMATOID  . Asthma   . Bipolar 1 disorder ([place])   . Cataracts, bilateral 07/2017  . COPD (chronic obstructive pulmonary disease) ([place])   . Coronary artery disease    reported hx of "MI";  Echo 2009 with normal LVF;  Myoview 05/2011: no ischemia  . Depression   . Dyslipidemia   . Dysrhythmia    SVT  . Esophageal stricture   . Fibromyalgia   . GERD (gastroesophageal reflux  disease)   . H/O hiatal hernia   . Head injury, unspecified   . Herpes simplex infection   . History of kidney stones   . Hyperlipidemia   . Hypertension   . Insomnia   . Myocardial infarction [facility])    age 20  . Osteoporosis   . Pneumonia    hx  . Seizures ([place])    last 3 weeks ago- prescribed keppra -never took didnt like side effects read about online  . Shortness of breath   . Sleep apnea    ? neg  . Spinal stenosis of lumbar region   . Spondylolisthesis   . Status post placement of implantable loop recorder   . Supraventricular tachycardia ([place])   . Syncope and collapse    s/p ILR; no arhythmogenic cause identified  . UTI (lower urinary tract infection)     Family History  Problem Relation Age of Onset  . Heart attack Father   . Cancer Father   . Mental illness Father   . Cancer Mother   . Mental illness Mother   . Heart attack Brother        stents  . Alcohol abuse Brother   . Heart disease Brother   . Drug abuse Brother   . Diabetes Brother   . Colon cancer Maternal Aunt   . Cirrhosis Brother   . Stomach cancer Neg Hx     Past Surgical History:  Procedure Laterality Date  . BACK SURGERY    . BREAST SURGERY     lumpectomy  . CATARACT EXTRACTION W/PHACO Right 07/31/2017   Procedure: CATARACT EXTRACTION PHACO AND INTRAOCULAR LENS PLACEMENT (IOC);  Surgeon: [name], MD;  Location: [facility];  Service: Ophthalmology;  Laterality: Right;  CDE: 2.33  . CATARACT EXTRACTION W/PHACO Left 08/14/2017   Procedure: CATARACT EXTRACTION PHACO AND INTRAOCULAR LENS PLACEMENT (IOC);  Surgeon: [name], MD;  Location: [facility];  Service: Ophthalmology;  Laterality: Left;  CDE: 2.74  . CHOLECYSTECTOMY    . CYSTOSCOPY     stone  . DOPPLER ECHOCARDIOGRAPHY  2009  . head up tilt table testing  06/15/2007   [name]  . HEMORRHOID SURGERY    . insertion of implatable loop recorder  08/11/2007   [name]  . POSTERIOR CERVICAL FUSION/FORAMINOTOMY N/A 12/19/2013    Procedure: RIGHT C3-4.C4-5 AND C5-6 FORAMINOTOMIES;  Surgeon: [name], MD;  Location: [facility];  Service: Orthopedics;  Laterality: N/A;  . TUBAL LIGATION     Social History   Occupational History  . Occupation: Disability    Comment: 15 years  Tobacco Use  . Smoking status: Never Smoker  . Smokeless tobacco: Never Used  Substance and Sexual Activity  . Alcohol use: No    Alcohol/week: 0.0 standard drinks  . Drug use: No  . Sexual activity: Not Currently

## 2018-11-26 ENCOUNTER — Encounter: Payer: Self-pay | Admitting: Physical Medicine and Rehabilitation

## 2018-11-26 ENCOUNTER — Ambulatory Visit: Payer: Self-pay

## 2018-11-26 ENCOUNTER — Ambulatory Visit (INDEPENDENT_AMBULATORY_CARE_PROVIDER_SITE_OTHER): Payer: 59 | Admitting: Physical Medicine and Rehabilitation

## 2018-11-26 DIAGNOSIS — M7062 Trochanteric bursitis, left hip: Secondary | ICD-10-CM

## 2018-11-26 DIAGNOSIS — M7061 Trochanteric bursitis, right hip: Secondary | ICD-10-CM

## 2018-11-26 NOTE — Progress Notes (Signed)
  Numeric Pain Rating Scale and Functional Assessment Average Pain 10   In the last MONTH (on 0-10 scale) has pain interfered with the following?  1. General activity like being  able to carry out your everyday physical activities such as walking, climbing stairs, carrying groceries, or moving a chair?  Rating(10)    -Dye Allergies. 

## 2018-11-30 DIAGNOSIS — M7062 Trochanteric bursitis, left hip: Secondary | ICD-10-CM

## 2018-11-30 DIAGNOSIS — M7061 Trochanteric bursitis, right hip: Secondary | ICD-10-CM | POA: Diagnosis not present

## 2018-11-30 MED ORDER — TRIAMCINOLONE ACETONIDE 40 MG/ML IJ SUSP
60.0000 mg | INTRAMUSCULAR | Status: AC | PRN
  Administered 2018-11-30: 60 mg via INTRA_ARTICULAR

## 2018-11-30 MED ORDER — BUPIVACAINE HCL 0.25 % IJ SOLN
4.0000 mL | INTRAMUSCULAR | Status: AC | PRN
  Administered 2018-11-30: 09:00:00 4 mL via INTRA_ARTICULAR

## 2018-11-30 MED ORDER — BUPIVACAINE HCL 0.25 % IJ SOLN
4.0000 mL | INTRAMUSCULAR | Status: AC | PRN
  Administered 2018-11-30: 4 mL via INTRA_ARTICULAR

## 2018-11-30 NOTE — Progress Notes (Signed)
   [name] - 63 y.o. female MRN [id number]  Date of birth: 04-21-1955  Office Visit Note: Visit Date: 11/26/2018 PCP: Chevis Pretty, FNP Referred by: Hassell Done, [name], *  Subjective: Chief Complaint  Patient presents with  . Right Hip - Pain  . Left Hip - Pain   HPI:  [name] is a 63 y.o. female who comes in today For planned bilateral greater trochanteric injections with fluoroscopic guidance.  Patient comes in today with bilateral greater trochanteric pain worse with laying on either side and movement.  She has a long history of chronic pain syndrome with lumbar fusion followed by Dr. Basil Dess.  She is also followed from orthopedic standpoint by Dr. Eduard Roux.  We completed bilateral injections back in January with really good relief up until recently.  She has had no new trauma or other issues.  No radicular symptoms down the legs or paresthesias.  We will go ahead today and complete bilateral greater trochanteric injections diagnostically and hopefully therapeutically.  Consideration given to L5 transforaminal injection depending on relief versus follow-up with Dr. Louanne Skye.  ROS Otherwise per HPI.  Assessment & Plan: Visit Diagnoses:  1. Greater trochanteric bursitis, left   2. Greater trochanteric bursitis, right     Plan: No additional findings.   Meds & Orders: No orders of the defined types were placed in this encounter.   Orders Placed This Encounter  Procedures  . Large Joint Inj  . XR C-ARM NO REPORT    Follow-up: No follow-ups on file.   Procedures: Large Joint Inj: bilateral greater trochanter on 11/30/2018 8:59 AM Indications: pain and diagnostic evaluation Details: 22 G 3.5 in needle, fluoroscopy-guided lateral approach  Arthrogram: No  Medications (Right): 60 mg triamcinolone acetonide 40 MG/ML; 4 mL bupivacaine 0.25 % Medications (Left): 60 mg triamcinolone acetonide 40 MG/ML; 4 mL bupivacaine 0.25 % Outcome: tolerated well,  no immediate complications  There was excellent flow of contrast outlined the greater trochanteric bursa without vascular uptake. Procedure, treatment alternatives, risks and benefits explained, specific risks discussed. Consent was given by the patient. Immediately prior to procedure a time out was called to verify the correct patient, procedure, equipment, support staff and site/side marked as required. Patient was prepped and draped in the usual sterile fashion.      No notes on file   Clinical History: DG HIP (WITH OR WITHOUT PELVIS) 2-3V LEFT  COMPARISON:  April 16, 2017  FINDINGS: Frontal pelvis as well as frontal and lateral left hip images were obtained. No acute fracture or dislocation evident. 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 No erosive change. There is postoperative change in the lower lumbar spine. Sacroiliac joints appear normal bilaterally.  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 No acute fracture or dislocation. Postoperative change lower lumbar spine.   Electronically Signed   By: Lowella Grip III M.D.   On: 08/10/2017 14:23     Objective:  VS:  HT:    WT:   BMI:     BP:   HR: bpm  TEMP: ( )  RESP:  Physical Exam  Ortho Exam Imaging: No results found.

## 2018-12-02 ENCOUNTER — Other Ambulatory Visit: Payer: Self-pay | Admitting: Nurse Practitioner

## 2018-12-07 ENCOUNTER — Other Ambulatory Visit: Payer: Self-pay | Admitting: Nurse Practitioner

## 2018-12-07 DIAGNOSIS — I200 Unstable angina: Secondary | ICD-10-CM

## 2018-12-07 DIAGNOSIS — G44229 Chronic tension-type headache, not intractable: Secondary | ICD-10-CM

## 2018-12-08 ENCOUNTER — Other Ambulatory Visit: Payer: Self-pay | Admitting: Nurse Practitioner

## 2018-12-14 ENCOUNTER — Ambulatory Visit (INDEPENDENT_AMBULATORY_CARE_PROVIDER_SITE_OTHER): Payer: 59 | Admitting: Nurse Practitioner

## 2018-12-14 ENCOUNTER — Encounter: Payer: Self-pay | Admitting: Nurse Practitioner

## 2018-12-14 ENCOUNTER — Other Ambulatory Visit: Payer: Self-pay

## 2018-12-14 VITALS — BP 189/87 | HR 90 | Temp 98.2°F | Ht 61.0 in | Wt 152.0 lb

## 2018-12-14 DIAGNOSIS — N898 Other specified noninflammatory disorders of vagina: Secondary | ICD-10-CM

## 2018-12-14 DIAGNOSIS — A549 Gonococcal infection, unspecified: Secondary | ICD-10-CM

## 2018-12-14 LAB — URINALYSIS, COMPLETE
Bilirubin, UA: NEGATIVE
Ketones, UA: NEGATIVE
Nitrite, UA: NEGATIVE
Specific Gravity, UA: >1.03 — ABNORMAL HIGH (ref 1.005–1.030)
Urobilinogen, Ur: 0.2 mg/dL (ref 0.2–1.0)
pH, UA: 5 (ref 5.0–7.5)

## 2018-12-14 LAB — MICROSCOPIC EXAMINATION
Renal Epithel, UA: NONE SEEN /hpf
WBC, UA: >30 /hpf — AB (ref 0–5)

## 2018-12-14 LAB — WET PREP FOR TRICH, YEAST, CLUE
Clue Cell Exam: NEGATIVE
Trichomonas Exam: NEGATIVE
Yeast Exam: NEGATIVE

## 2018-12-14 NOTE — Patient Instructions (Signed)
Vaginitis  Vaginitis is irritation and swelling (inflammation) of the vagina. It happens when normal bacteria and yeast in the vagina grow too much. There are many types of this condition. Treatment will depend on the type you have. Follow these instructions at home: Lifestyle  Keep your vagina area clean and dry. ? Avoid using soap. ? Rinse the area with water.  Do not do the following until your doctor says it is okay: ? Wash and clean out the vagina (douche). ? Use tampons. ? Have sex.  Wipe from front to back after going to the bathroom.  Let air reach your vagina. ? Wear cotton underwear. ? Do not wear: ? Underwear while you sleep. ? Tight pants. ? Thong underwear. ? Underwear or nylons without a cotton panel. ? Take off any wet clothing, such as bathing suits, as soon as possible.  Use gentle, non-scented products. Do not use things that can irritate the vagina, such as fabric softeners. Avoid the following products if they are scented: ? Feminine sprays. ? Detergents. ? Tampons. ? Feminine hygiene products. ? Soaps or bubble baths.  Practice safe sex and use condoms. General instructions  Take over-the-counter and prescription medicines only as told by your doctor.  If you were prescribed an antibiotic medicine, take or use it as told by your doctor. Do not stop taking or using the antibiotic even if you start to feel better.  Keep all follow-up visits as told by your doctor. This is important. Contact a doctor if:  You have pain in your belly.  You have a fever.  Your symptoms last for more than 2-3 days. Get help right away if:  You have a fever and your symptoms get worse all of a sudden. Summary  Vaginitis is irritation and swelling of the vagina. It can happen when the normal bacteria and yeast in the vagina grow too much. There are many types.  Treatment will depend on the type you have.  Do not douche, use tampons , or have sex until your health  care provider approves. When you can return to sex, practice safe sex and use condoms. This information is not intended to replace advice given to you by your health care provider. Make sure you discuss any questions you have with your health care provider. Document Released: 06/13/2008 Document Revised: 02/27/2017 Document Reviewed: 04/08/2016 Elsevier Patient Education  2020 [employer]vier Inc[employer]  

## 2018-12-14 NOTE — Progress Notes (Signed)
   Subjective:    Patient ID: [name], female    DOB: 1955-05-11, 63 y.o.   MRN: [id number]   Chief Complaint: vaginal irritation   HPI Patient comes in today c/o vaginal irritation that started several days ago. Is itching   Review of Systems  Constitutional: Negative.   Respiratory: Negative.   Cardiovascular: Negative.   Genitourinary: Positive for vaginal discharge and vaginal pain (slight). Negative for vaginal bleeding.  All other systems reviewed and are negative.      Objective:   Physical Exam Vitals signs reviewed.  Cardiovascular:     Pulses: Normal pulses.     Heart sounds: Normal heart sounds.  Genitourinary:    Comments: Self wet prep   Wet prep- lots of WBC otherwise normal       Assessment & Plan:  TAGEN BRETHAUER in today with chief complaint of vaginal irritation   1. Vaginal irritation Douche x1 - WET PREP FOR TRICH, YEAST, CLUE - Urinalysis, Complete - Chlamydia/Gonococcus/Trichomonas, NAA  [name] Hassell Done, FNP

## 2018-12-15 ENCOUNTER — Telehealth: Payer: Self-pay | Admitting: Nurse Practitioner

## 2018-12-15 NOTE — Telephone Encounter (Signed)
Wet prep was negative. We are awaiting the additional culture results. If symptoms worsen, she can be reseen.

## 2018-12-15 NOTE — Telephone Encounter (Signed)
Patient aware.

## 2018-12-15 NOTE — Telephone Encounter (Signed)
She needs to be seen to have wet prep.

## 2018-12-15 NOTE — Telephone Encounter (Signed)
Patient states that she follow MMMs instructions yesterday.  Patient still has vaginal irritation, burning, itching, discharge and odor. Patient would like to have urine results reviewed.

## 2018-12-16 ENCOUNTER — Telehealth: Payer: Self-pay | Admitting: Nurse Practitioner

## 2018-12-16 ENCOUNTER — Other Ambulatory Visit: Payer: Self-pay

## 2018-12-16 ENCOUNTER — Encounter: Payer: Self-pay | Admitting: Family

## 2018-12-16 ENCOUNTER — Ambulatory Visit (INDEPENDENT_AMBULATORY_CARE_PROVIDER_SITE_OTHER): Payer: 59 | Admitting: Family

## 2018-12-16 VITALS — BP 184/107 | HR 112 | Temp 98.9°F | Resp 20 | Ht 61.0 in | Wt 156.0 lb

## 2018-12-16 DIAGNOSIS — B9689 Other specified bacterial agents as the cause of diseases classified elsewhere: Secondary | ICD-10-CM | POA: Diagnosis not present

## 2018-12-16 DIAGNOSIS — N760 Acute vaginitis: Secondary | ICD-10-CM | POA: Diagnosis not present

## 2018-12-16 DIAGNOSIS — N3000 Acute cystitis without hematuria: Secondary | ICD-10-CM | POA: Diagnosis not present

## 2018-12-16 DIAGNOSIS — R102 Pelvic and perineal pain: Secondary | ICD-10-CM | POA: Diagnosis not present

## 2018-12-16 LAB — MICROSCOPIC EXAMINATION
Renal Epithel, UA: NONE SEEN /hpf
WBC, UA: >30 /hpf — AB (ref 0–5)

## 2018-12-16 LAB — URINALYSIS, COMPLETE
Bilirubin, UA: NEGATIVE
Glucose, UA: NEGATIVE
Ketones, UA: NEGATIVE
Nitrite, UA: NEGATIVE
Specific Gravity, UA: 1.025 (ref 1.005–1.030)
Urobilinogen, Ur: 0.2 mg/dL (ref 0.2–1.0)
pH, UA: 5.5 (ref 5.0–7.5)

## 2018-12-16 LAB — WET PREP FOR TRICH, YEAST, CLUE
Clue Cell Exam: NEGATIVE
Trichomonas Exam: NEGATIVE
Yeast Exam: NEGATIVE

## 2018-12-16 LAB — CHLAMYDIA/GONOCOCCUS/TRICHOMONAS, NAA
Chlamydia by NAA: NEGATIVE
Gonococcus by NAA: POSITIVE — AB
Trich vag by NAA: NEGATIVE

## 2018-12-16 MED ORDER — KETOROLAC TROMETHAMINE 60 MG/2ML IM SOLN
60.0000 mg | Freq: Once | INTRAMUSCULAR | Status: AC
  Administered 2018-12-16: 60 mg via INTRAMUSCULAR

## 2018-12-16 MED ORDER — CEPHALEXIN 500 MG PO CAPS: 500.0000 mg | ORAL_CAPSULE | Freq: Two times a day (BID) | ORAL | 0 refills | Status: DC

## 2018-12-16 MED ORDER — METRONIDAZOLE 500 MG PO TABS: 500.0000 mg | ORAL_TABLET | Freq: Two times a day (BID) | ORAL | 0 refills | Status: DC

## 2018-12-16 NOTE — Progress Notes (Signed)
Subjective:    Patient ID: [name], female    DOB: [birth year], 63 y.o.   MRN: [id number]  Chief Complaint  Patient presents with  . vaginal problems    red,swollen and painful   Pt presents to the office today for recurrent vaginal pain. She was seen on 12/14/18 and had a negative wet prep, chlamydia, and gonorrhea.  Dysuria  This is a new problem. The current episode started 1 to 4 weeks ago. The problem occurs every urination. The problem has been gradually worsening. The quality of the pain is described as burning. The pain is at a severity of 9/10. The pain is moderate. Associated symptoms include frequency, nausea and urgency. Pertinent negatives include no discharge, flank pain, hematuria, hesitancy or vomiting. She has tried increased fluids for the symptoms. The treatment provided mild relief.  Vaginal Pain Associated symptoms include dysuria, frequency, nausea and urgency. Pertinent negatives include no flank pain, hematuria or vomiting.      Review of Systems  Gastrointestinal: Positive for nausea. Negative for vomiting.  Genitourinary: Positive for dysuria, frequency, urgency and vaginal pain. Negative for flank pain, hematuria and hesitancy.  All other systems reviewed and are negative.      Objective:   Physical Exam Vitals signs reviewed.  Constitutional:      General: She is not in acute distress.    Appearance: She is well-developed.  HENT:     Head: Normocephalic and atraumatic.  Eyes:     Pupils: Pupils are equal, round, and reactive to light.  Neck:     Musculoskeletal: Normal range of motion and neck supple.     Thyroid: No thyromegaly.  Cardiovascular:     Rate and Rhythm: Normal rate and regular rhythm.     Heart sounds: Normal heart sounds. No murmur.  Pulmonary:     Effort: Pulmonary effort is normal. No respiratory distress.     Breath sounds: Normal breath sounds. No wheezing.  Abdominal:     General: Bowel sounds are normal. There is  no distension.     Palpations: Abdomen is soft.     Tenderness: There is no abdominal tenderness.  Genitourinary:    Labia:        Right: Tenderness present. No rash.        Left: Tenderness present. No rash.      Comments: Yellow discharge  Musculoskeletal: Normal range of motion.        General: No tenderness.  Skin:    General: Skin is warm and dry.  Neurological:     Mental Status: She is alert and oriented to person, place, and time.     Cranial Nerves: No cranial nerve deficit.     Deep Tendon Reflexes: Reflexes are normal and symmetric.  Psychiatric:        Behavior: Behavior normal.        Thought Content: Thought content normal.        Judgment: Judgment normal.       BP (!) 184/107   Pulse (!) 112   Temp 98.9 F (37.2 C) (Temporal)   Resp 20   Ht 5\' 1"  (1.549 m)   Wt 156 lb (70.8 kg)   SpO2 98%   BMI 29.48 kg/m      Assessment & Plan:  [name] comes in today with chief complaint of vaginal problems (red,swollen and painful)   Diagnosis and orders addressed:  1. Vaginal pain - WET PREP FOR TRICH, YEAST, CLUE -  Urinalysis, Complete - ketorolac (TORADOL) injection 60 mg  2. BV (bacterial vaginosis) Keep clean and dry - metroNIDAZOLE (FLAGYL) 500 MG tablet; Take 1 tablet (500 mg total) by mouth 2 (two) times daily.  Dispense: 14 tablet; Refill: 0  3. Acute cystitis without hematuria Force fluids AZO over the counter X2 days RTO if symptoms worsen or do not improve  Culture pending - cephALEXin (KEFLEX) 500 MG capsule; Take 1 capsule (500 mg total) by mouth 2 (two) times daily.  Dispense: 14 capsule; Refill: 0 - Urine Culture  Jannifer Rodney, FNP

## 2018-12-16 NOTE — Patient Instructions (Signed)

## 2018-12-17 LAB — URINE CULTURE: Organism ID, Bacteria: NO GROWTH

## 2018-12-20 MED ORDER — AZITHROMYCIN 250 MG PO TABS: ORAL_TABLET | ORAL | 0 refills | Status: DC

## 2018-12-20 NOTE — Addendum Note (Signed)
Addended by: Chevis Pretty on: 12/20/2018 11:52 AM   Modules accepted: Orders

## 2018-12-24 ENCOUNTER — Other Ambulatory Visit (INDEPENDENT_AMBULATORY_CARE_PROVIDER_SITE_OTHER): Payer: 59

## 2018-12-24 ENCOUNTER — Other Ambulatory Visit: Payer: Self-pay

## 2018-12-24 ENCOUNTER — Other Ambulatory Visit: Payer: Self-pay | Admitting: Nurse Practitioner

## 2018-12-24 DIAGNOSIS — A549 Gonococcal infection, unspecified: Secondary | ICD-10-CM

## 2018-12-24 DIAGNOSIS — B009 Herpesviral infection, unspecified: Secondary | ICD-10-CM

## 2018-12-24 DIAGNOSIS — A6004 Herpesviral vulvovaginitis: Secondary | ICD-10-CM

## 2018-12-24 MED ORDER — CEFTRIAXONE SODIUM 250 MG IJ SOLR
250.0000 mg | Freq: Once | INTRAMUSCULAR | Status: AC
  Administered 2018-12-24: 250 mg via INTRAMUSCULAR

## 2018-12-24 MED ORDER — LIDOCAINE 5 % EX OINT: 1.0000 "application " | TOPICAL_OINTMENT | Freq: Three times a day (TID) | CUTANEOUS | 0 refills | Status: DC | PRN

## 2018-12-24 MED ORDER — AZITHROMYCIN 250 MG PO TABS: ORAL_TABLET | ORAL | 0 refills | Status: DC

## 2018-12-24 NOTE — Progress Notes (Signed)
Lidocaine gell for hemorrhoids

## 2019-01-04 ENCOUNTER — Other Ambulatory Visit: Payer: Self-pay | Admitting: Nurse Practitioner

## 2019-01-04 ENCOUNTER — Other Ambulatory Visit: Payer: Self-pay | Admitting: Family Medicine

## 2019-01-04 ENCOUNTER — Other Ambulatory Visit: Payer: Self-pay | Admitting: Cardiology

## 2019-01-04 DIAGNOSIS — A6004 Herpesviral vulvovaginitis: Secondary | ICD-10-CM

## 2019-01-04 DIAGNOSIS — I200 Unstable angina: Secondary | ICD-10-CM

## 2019-01-04 DIAGNOSIS — N941 Unspecified dyspareunia: Secondary | ICD-10-CM

## 2019-01-04 DIAGNOSIS — G44229 Chronic tension-type headache, not intractable: Secondary | ICD-10-CM

## 2019-01-04 DIAGNOSIS — B009 Herpesviral infection, unspecified: Secondary | ICD-10-CM

## 2019-01-04 DIAGNOSIS — J309 Allergic rhinitis, unspecified: Secondary | ICD-10-CM

## 2019-01-04 DIAGNOSIS — K2100 Gastro-esophageal reflux disease with esophagitis, without bleeding: Secondary | ICD-10-CM

## 2019-01-04 DIAGNOSIS — M47812 Spondylosis without myelopathy or radiculopathy, cervical region: Secondary | ICD-10-CM

## 2019-01-04 DIAGNOSIS — R569 Unspecified convulsions: Secondary | ICD-10-CM

## 2019-01-04 DIAGNOSIS — I10 Essential (primary) hypertension: Secondary | ICD-10-CM

## 2019-01-08 ENCOUNTER — Other Ambulatory Visit: Payer: Self-pay | Admitting: Nurse Practitioner

## 2019-01-08 DIAGNOSIS — G44229 Chronic tension-type headache, not intractable: Secondary | ICD-10-CM

## 2019-01-13 ENCOUNTER — Telehealth: Payer: Self-pay | Admitting: Cardiology

## 2019-01-13 NOTE — Telephone Encounter (Signed)
Pt says for the last 4 months SBP has been over 200 says HR remains in the 130s - pt c/o headaches/SOB on exertion and cold sweats while doing house work - says this also has been going on for the last 4 months - denies dizziness chest pain/swelling - has f/u with Dr Harl Bowie 11/6

## 2019-01-13 NOTE — Telephone Encounter (Signed)
Blood pressure has been running higher than normal  243/128  221/115  211/110 after taking two carvedilol (COREG) 6.25 MG tablet

## 2019-01-14 NOTE — Telephone Encounter (Signed)
Pt voiced understanding - says she will hold florinef this weekend - checked HR while over the phone and was 106 - pt will report to ED evaluation if HR goes back up or symptoms worsen - EKG scheduled for Monday at 9am

## 2019-01-14 NOTE — Telephone Encounter (Signed)
She should hold her florienf over the weekend and see if that helps bring her bp's down. If they come down over the weekend could restart her florinef at that time. A few days off should help bring her bp down some  The high heart rates if truly in 130s is more worrisome as we have not seen this before for her like we have the high bp's. With her other symptoms and if heart rate truly that high I would recommend she be seen in the ER. If she refuses then needs a nursing visit with vitals and EKG Monday. Needs to be evaluated for possible heart arrhythmia     Erica Abts MD

## 2019-01-17 ENCOUNTER — Ambulatory Visit (INDEPENDENT_AMBULATORY_CARE_PROVIDER_SITE_OTHER): Payer: 59 | Admitting: *Deleted

## 2019-01-17 ENCOUNTER — Emergency Department (HOSPITAL_COMMUNITY): Payer: 59

## 2019-01-17 ENCOUNTER — Other Ambulatory Visit: Payer: Self-pay

## 2019-01-17 ENCOUNTER — Encounter (HOSPITAL_COMMUNITY): Payer: Self-pay | Admitting: Emergency Medicine

## 2019-01-17 ENCOUNTER — Observation Stay (HOSPITAL_COMMUNITY)
Admission: EM | Admit: 2019-01-17 | Discharge: 2019-01-19 | Disposition: A | Discharge status: Home / Self Care | Payer: 59 | Attending: Internal Medicine | Admitting: Internal Medicine

## 2019-01-17 DIAGNOSIS — Z79899 Other long term (current) drug therapy: Secondary | ICD-10-CM | POA: Diagnosis not present

## 2019-01-17 DIAGNOSIS — R0789 Other chest pain: Secondary | ICD-10-CM

## 2019-01-17 DIAGNOSIS — G40909 Epilepsy, unspecified, not intractable, without status epilepticus: Secondary | ICD-10-CM | POA: Insufficient documentation

## 2019-01-17 DIAGNOSIS — R079 Chest pain, unspecified: Secondary | ICD-10-CM | POA: Diagnosis present

## 2019-01-17 DIAGNOSIS — I252 Old myocardial infarction: Secondary | ICD-10-CM | POA: Insufficient documentation

## 2019-01-17 DIAGNOSIS — J449 Chronic obstructive pulmonary disease, unspecified: Secondary | ICD-10-CM | POA: Insufficient documentation

## 2019-01-17 DIAGNOSIS — I10 Essential (primary) hypertension: Secondary | ICD-10-CM | POA: Insufficient documentation

## 2019-01-17 DIAGNOSIS — Z23 Encounter for immunization: Secondary | ICD-10-CM | POA: Diagnosis not present

## 2019-01-17 DIAGNOSIS — M797 Fibromyalgia: Secondary | ICD-10-CM | POA: Insufficient documentation

## 2019-01-17 DIAGNOSIS — Z20828 Contact with and (suspected) exposure to other viral communicable diseases: Secondary | ICD-10-CM | POA: Insufficient documentation

## 2019-01-17 DIAGNOSIS — E785 Hyperlipidemia, unspecified: Secondary | ICD-10-CM | POA: Diagnosis not present

## 2019-01-17 DIAGNOSIS — B009 Herpesviral infection, unspecified: Secondary | ICD-10-CM | POA: Insufficient documentation

## 2019-01-17 DIAGNOSIS — I25110 Atherosclerotic heart disease of native coronary artery with unstable angina pectoris: Secondary | ICD-10-CM | POA: Insufficient documentation

## 2019-01-17 DIAGNOSIS — F411 Generalized anxiety disorder: Secondary | ICD-10-CM | POA: Insufficient documentation

## 2019-01-17 DIAGNOSIS — F319 Bipolar disorder, unspecified: Secondary | ICD-10-CM | POA: Insufficient documentation

## 2019-01-17 DIAGNOSIS — K219 Gastro-esophageal reflux disease without esophagitis: Secondary | ICD-10-CM | POA: Diagnosis not present

## 2019-01-17 DIAGNOSIS — I200 Unstable angina: Secondary | ICD-10-CM

## 2019-01-17 LAB — CBC
HCT: 41 % (ref 36.0–46.0)
Hemoglobin: 11.8 g/dL — ABNORMAL LOW (ref 12.0–15.0)
MCH: 24.5 pg — ABNORMAL LOW (ref 26.0–34.0)
MCHC: 28.8 g/dL — ABNORMAL LOW (ref 30.0–36.0)
MCV: 85.1 fL (ref 80.0–100.0)
Platelets: 200 10*3/uL (ref 150–400)
RBC: 4.82 MIL/uL (ref 3.87–5.11)
RDW: 15.9 % — ABNORMAL HIGH (ref 11.5–15.5)
WBC: 7.5 10*3/uL (ref 4.0–10.5)
nRBC: 0 % (ref 0.0–0.2)

## 2019-01-17 LAB — TROPONIN I (HIGH SENSITIVITY)
Troponin I (High Sensitivity): 7 ng/L (ref <18)
Troponin I (High Sensitivity): 7 ng/L (ref <18)
Troponin I (High Sensitivity): 10 ng/L (ref <18)

## 2019-01-17 LAB — BASIC METABOLIC PANEL
Anion gap: 10 (ref 5–15)
BUN: 10 mg/dL (ref 8–23)
CO2: 28 mmol/L (ref 22–32)
Calcium: 9.1 mg/dL (ref 8.9–10.3)
Chloride: 103 mmol/L (ref 98–111)
Creatinine, Ser: 0.81 mg/dL (ref 0.44–1.00)
GFR calc Af Amer: >60 mL/min (ref >60)
GFR calc non Af Amer: >60 mL/min (ref >60)
Glucose, Bld: 113 mg/dL — ABNORMAL HIGH (ref 70–99)
Potassium: 3.6 mmol/L (ref 3.5–5.1)
Sodium: 141 mmol/L (ref 135–145)

## 2019-01-17 LAB — D-DIMER, QUANTITATIVE: D-Dimer, Quant: 1.01 ug/mL-FEU — ABNORMAL HIGH (ref 0.00–0.50)

## 2019-01-17 MED ORDER — DULOXETINE HCL 60 MG PO CPEP
60.0000 mg | ORAL_CAPSULE | Freq: Every day | ORAL | Status: DC
  Administered 2019-01-17 – 2019-01-18 (×2): 60 mg via ORAL
  Filled 2019-01-17: qty 2
  Filled 2019-01-17: qty 1

## 2019-01-17 MED ORDER — ISOSORBIDE MONONITRATE ER 60 MG PO TB24
30.0000 mg | ORAL_TABLET | Freq: Every day | ORAL | Status: DC
  Administered 2019-01-17 – 2019-01-19 (×3): 30 mg via ORAL
  Filled 2019-01-17 (×6): qty 1

## 2019-01-17 MED ORDER — ONDANSETRON HCL 4 MG PO TABS
4.0000 mg | ORAL_TABLET | Freq: Four times a day (QID) | ORAL | Status: DC | PRN
  Filled 2019-01-17: qty 1

## 2019-01-17 MED ORDER — TRAZODONE HCL 50 MG PO TABS
150.0000 mg | ORAL_TABLET | Freq: Every day | ORAL | Status: DC
  Administered 2019-01-17 – 2019-01-18 (×2): 150 mg via ORAL
  Filled 2019-01-17 (×2): qty 3

## 2019-01-17 MED ORDER — SUCRALFATE 1 G PO TABS
1.0000 g | ORAL_TABLET | Freq: Three times a day (TID) | ORAL | Status: DC
  Administered 2019-01-17 – 2019-01-19 (×5): 1 g via ORAL
  Filled 2019-01-17 (×5): qty 1

## 2019-01-17 MED ORDER — PANTOPRAZOLE SODIUM 40 MG PO TBEC
40.0000 mg | DELAYED_RELEASE_TABLET | Freq: Every day | ORAL | Status: DC
  Administered 2019-01-17 – 2019-01-19 (×3): 40 mg via ORAL
  Filled 2019-01-17 (×3): qty 1

## 2019-01-17 MED ORDER — ESCITALOPRAM OXALATE 10 MG PO TABS
20.0000 mg | ORAL_TABLET | Freq: Every day | ORAL | Status: DC
  Administered 2019-01-17 – 2019-01-19 (×3): 20 mg via ORAL
  Filled 2019-01-17 (×5): qty 2

## 2019-01-17 MED ORDER — ACETAMINOPHEN 650 MG RE SUPP: 650.0000 mg | Freq: Four times a day (QID) | RECTAL | Status: DC | PRN

## 2019-01-17 MED ORDER — ROSUVASTATIN CALCIUM 10 MG PO TABS
10.0000 mg | ORAL_TABLET | Freq: Every day | ORAL | Status: DC
  Administered 2019-01-17 – 2019-01-19 (×3): 10 mg via ORAL
  Filled 2019-01-17 (×6): qty 1

## 2019-01-17 MED ORDER — DIAZEPAM 5 MG PO TABS
5.0000 mg | ORAL_TABLET | Freq: Two times a day (BID) | ORAL | Status: DC
  Administered 2019-01-17 – 2019-01-19 (×4): 5 mg via ORAL
  Filled 2019-01-17 (×4): qty 1

## 2019-01-17 MED ORDER — ALBUTEROL SULFATE HFA 108 (90 BASE) MCG/ACT IN AERS
2.0000 | INHALATION_SPRAY | Freq: Four times a day (QID) | RESPIRATORY_TRACT | Status: DC | PRN
  Filled 2019-01-17: qty 6.7

## 2019-01-17 MED ORDER — ASPIRIN 81 MG PO CHEW
324.0000 mg | CHEWABLE_TABLET | Freq: Once | ORAL | Status: AC
  Administered 2019-01-17: 324 mg via ORAL
  Filled 2019-01-17: qty 4

## 2019-01-17 MED ORDER — AZITHROMYCIN 250 MG PO TABS
500.0000 mg | ORAL_TABLET | Freq: Every day | ORAL | Status: DC
  Administered 2019-01-17 – 2019-01-19 (×3): 500 mg via ORAL
  Filled 2019-01-17 (×3): qty 2

## 2019-01-17 MED ORDER — HYDROCODONE-ACETAMINOPHEN 5-325 MG PO TABS
1.0000 | ORAL_TABLET | Freq: Once | ORAL | Status: AC
  Administered 2019-01-17: 1 via ORAL
  Filled 2019-01-17: qty 1

## 2019-01-17 MED ORDER — FLUTICASONE FUROATE-VILANTEROL 200-25 MCG/INH IN AEPB: 1.0000 | INHALATION_SPRAY | Freq: Every day | RESPIRATORY_TRACT | Status: DC

## 2019-01-17 MED ORDER — IOHEXOL 350 MG/ML SOLN
100.0000 mL | Freq: Once | INTRAVENOUS | Status: AC | PRN
  Administered 2019-01-17: 100 mL via INTRAVENOUS

## 2019-01-17 MED ORDER — FLUTICASONE FUROATE-VILANTEROL 200-25 MCG/INH IN AEPB
1.0000 | INHALATION_SPRAY | Freq: Every day | RESPIRATORY_TRACT | Status: DC
  Administered 2019-01-19: 1 via RESPIRATORY_TRACT
  Filled 2019-01-17: qty 28

## 2019-01-17 MED ORDER — CARVEDILOL 3.125 MG PO TABS
6.2500 mg | ORAL_TABLET | Freq: Four times a day (QID) | ORAL | Status: DC
  Administered 2019-01-18: 6.25 mg via ORAL
  Filled 2019-01-17: qty 2

## 2019-01-17 MED ORDER — POLYETHYLENE GLYCOL 3350 17 G PO PACK: 17.0000 g | PACK | Freq: Every day | ORAL | Status: DC | PRN

## 2019-01-17 MED ORDER — VALACYCLOVIR HCL 500 MG PO TABS
500.0000 mg | ORAL_TABLET | Freq: Two times a day (BID) | ORAL | Status: DC
  Administered 2019-01-17 – 2019-01-19 (×4): 500 mg via ORAL
  Filled 2019-01-17 (×4): qty 1

## 2019-01-17 MED ORDER — ENOXAPARIN SODIUM 40 MG/0.4ML ~~LOC~~ SOLN
40.0000 mg | Freq: Every day | SUBCUTANEOUS | Status: DC
  Administered 2019-01-17 – 2019-01-18 (×2): 40 mg via SUBCUTANEOUS
  Filled 2019-01-17 (×2): qty 0.4

## 2019-01-17 MED ORDER — HYDROCODONE-ACETAMINOPHEN 5-325 MG PO TABS
1.0000 | ORAL_TABLET | ORAL | Status: DC | PRN
  Administered 2019-01-17 – 2019-01-19 (×3): 1 via ORAL
  Filled 2019-01-17 (×3): qty 1

## 2019-01-17 MED ORDER — LORATADINE 10 MG PO TABS
10.0000 mg | ORAL_TABLET | Freq: Every day | ORAL | Status: DC
  Administered 2019-01-19: 10 mg via ORAL
  Filled 2019-01-17 (×3): qty 1

## 2019-01-17 MED ORDER — SODIUM CHLORIDE 0.9 % IV SOLN
INTRAVENOUS | Status: DC
  Administered 2019-01-17: 14:00:00 via INTRAVENOUS

## 2019-01-17 MED ORDER — LEVETIRACETAM 500 MG PO TABS
500.0000 mg | ORAL_TABLET | Freq: Two times a day (BID) | ORAL | Status: DC
  Administered 2019-01-17 – 2019-01-19 (×4): 500 mg via ORAL
  Filled 2019-01-17 (×4): qty 1

## 2019-01-17 MED ORDER — LEVOCETIRIZINE DIHYDROCHLORIDE 5 MG PO TABS: 5.0000 mg | ORAL_TABLET | Freq: Every morning | ORAL | Status: DC

## 2019-01-17 MED ORDER — ACETAMINOPHEN 325 MG PO TABS: 650.0000 mg | ORAL_TABLET | Freq: Four times a day (QID) | ORAL | Status: DC | PRN

## 2019-01-17 MED ORDER — ONDANSETRON HCL 4 MG/2ML IJ SOLN: 4.0000 mg | Freq: Four times a day (QID) | INTRAMUSCULAR | Status: DC | PRN

## 2019-01-17 MED ORDER — OXYBUTYNIN CHLORIDE 5 MG PO TABS
5.0000 mg | ORAL_TABLET | Freq: Two times a day (BID) | ORAL | Status: DC
  Administered 2019-01-17 – 2019-01-19 (×4): 5 mg via ORAL
  Filled 2019-01-17 (×8): qty 1

## 2019-01-17 MED ORDER — LAMOTRIGINE 100 MG PO TABS
150.0000 mg | ORAL_TABLET | Freq: Every day | ORAL | Status: DC
  Administered 2019-01-17 – 2019-01-19 (×3): 150 mg via ORAL
  Filled 2019-01-17 (×2): qty 2
  Filled 2019-01-17: qty 6

## 2019-01-17 NOTE — H&P (Signed)
History and Physical    [name] WGN:[id number] DOB: 12-30-1955 DOA: 01/17/2019  PCP: Chevis Pretty, FNP   Patient coming from: Home  I have personally briefly reviewed patient's old medical records in [place]  Chief Complaint: Chest Pain  HPI: [name] is a 63 y.o. female with medical history significant for bipolar disorder, fibromyalgia, generalized anxiety disorders, seizures, hypertension, HSV.  Patient presented to the ED with complaints of persistent chest pain of 5 days duration.  Cannot remember what she was doing when the chest pain started.  Chest pain is worse with deep breathing.  No known relieving factors.  She describes pain as sharp, located on the left side of her chest, nonradiating.  She reports associated difficulty breathing, diaphoresis, and palpitations with chest pain.  She denies cough or shortness of breath otherwise.  Patient reports she had a heart attack when she was 63 years old but no stents were placed.  Never smoked cigarettes denies illicit drug use.  Patient reports she does not want to take nitro because it drops her blood pressure significantly.  She reports white blood pressure fluctuations from 784O to 96E systolic.  She has a loop recorder, she has not been able to follow-up with cardiology as she is afraid of getting infected with Covid. Patient called her cardiologist and was referred to the ED for evaluation.  ED Course: Systolic blood pressure 952W to 180s.  Hs  troponin x2 unremarkable.  EKG without significant changes from prior.  Unremarkable CBC BMP.  D-dimer elevated at 1, subsequent CTA chest negative for acute abnormality, but shows large hiatal hernia, several small peripheral nodules in the right lower lobe suggest mild or resolving infectious process.  With persistent chest pain hospitalist was called to admit for chest pain rule out ACS.  Review of Systems: As per HPI all other systems reviewed and  negative.  Past Medical History:  Diagnosis Date  . Anginal pain ([place])    last time   . Anxiety   . Arthritis    RHEUMATOID  . Asthma   . Bipolar 1 disorder ([place])   . Cataracts, bilateral 07/2017  . COPD (chronic obstructive pulmonary disease) ([place])   . Coronary artery disease    reported hx of "MI";  Echo 2009 with normal LVF;  Myoview 05/2011: no ischemia  . Depression   . Dyslipidemia   . Dysrhythmia    SVT  . Esophageal stricture   . Fibromyalgia   . GERD (gastroesophageal reflux disease)   . H/O hiatal hernia   . Head injury, unspecified   . Herpes simplex infection   . History of kidney stones   . Hyperlipidemia   . Hypertension   . Insomnia   . Myocardial infarction [facility])    age 32  . Osteoporosis   . Pneumonia    hx  . Seizures ([place])    last 3 weeks ago- prescribed keppra -never took didnt like side effects read about online  . Shortness of breath   . Sleep apnea    ? neg  . Spinal stenosis of lumbar region   . Spondylolisthesis   . Status post placement of implantable loop recorder   . Supraventricular tachycardia ([place])   . Syncope and collapse    s/p ILR; no arhythmogenic cause identified  . UTI (lower urinary tract infection)     Past Surgical History:  Procedure Laterality Date  . BACK SURGERY    . BREAST SURGERY  lumpectomy  . CATARACT EXTRACTION W/PHACO Right 07/31/2017   Procedure: CATARACT EXTRACTION PHACO AND INTRAOCULAR LENS PLACEMENT (IOC);  Surgeon: Fabio PierceWrzosek, James, MD;  Location: [facility];  Service: Ophthalmology;  Laterality: Right;  CDE: 2.33  . CATARACT EXTRACTION W/PHACO Left 08/14/2017   Procedure: CATARACT EXTRACTION PHACO AND INTRAOCULAR LENS PLACEMENT (IOC);  Surgeon: Fabio PierceWrzosek, James, MD;  Location: [facility];  Service: Ophthalmology;  Laterality: Left;  CDE: 2.74  . CHOLECYSTECTOMY    . CYSTOSCOPY     stone  . DOPPLER ECHOCARDIOGRAPHY  2009  . head up tilt table testing  06/15/2007   Lewayne BuntingGregg Taylor  . HEMORRHOID SURGERY    . insertion  of implatable loop recorder  08/11/2007   Lewayne BuntingGregg Taylor  . POSTERIOR CERVICAL FUSION/FORAMINOTOMY N/A 12/19/2013   Procedure: RIGHT C3-4.C4-5 AND C5-6 FORAMINOTOMIES;  Surgeon: Kerrin ChampagneJames E Nitka, MD;  [facility];  Service: Orthopedics;  Laterality: N/A;  . TUBAL LIGATION       reports that she has never smoked. She has never used smokeless tobacco. She reports that she does not drink alcohol or use drugs.  Allergies  Allergen Reactions  . Codeine Other (See Comments)    "I will have a heart attack."  . Morphine And Related Other (See Comments)    "It will cause me to have a heart attack."  . Ambien [Zolpidem Tartrate] Nausea And Vomiting  . Lyrica [Pregabalin] Swelling and Other (See Comments)    Weight gain  . Neurontin [Gabapentin] Other (See Comments)    Causes elevated LFTs     Family History  Problem Relation Age of Onset  . Heart attack Father   . Cancer Father   . Mental illness Father   . Cancer Mother   . Mental illness Mother   . Heart attack Brother        stents  . Alcohol abuse Brother   . Heart disease Brother   . Drug abuse Brother   . Diabetes Brother   . Colon cancer Maternal Aunt   . Cirrhosis Brother   . Stomach cancer Neg Hx     Prior to Admission medications   Medication Sig Start Date End Date Taking? Authorizing Provider  acyclovir ointment (ZOVIRAX) 5 % APPLY TO AFFECTED AREA EVERY 3 HOURS Patient taking differently: Apply 1 application topically daily as needed (to affected area(s)).  01/04/19  Yes Daphine DeutscherMartin, [name], FNP  albuterol (PROVENTIL) (2.5 MG/3ML) 0.083% nebulizer solution USE 1 VIAL IN NEBULIZER EVERY 4 HOURS AS NEEDED FOR WHEEZING. Patient taking differently: Take 2.5 mg by nebulization every 4 (four) hours as needed for wheezing.  05/03/18  Yes Martin, [name], FNP  albuterol (VENTOLIN HFA) 108 (90 Base) MCG/ACT inhaler INHALE 2 PUFFS EVERY 6 HOURS AS NEEDED FOR SHORTNESS OF BREATH AND WHEEZING. Patient taking differently:  Inhale 2 puffs into the lungs every 6 (six) hours as needed for wheezing or shortness of breath.  01/04/19  Yes Martin, [name], FNP  BREO ELLIPTA 200-25 MCG/INH AEPB INHALE 1 PUFF DAILY. Patient taking differently: Inhale 1 puff into the lungs daily.  01/04/19  Yes Martin, [name], FNP  busPIRone (BUSPAR) 10 MG tablet 1 po bid prn Patient taking differently: Take 10 mg by mouth 2 (two) times daily as needed (for mood/ as directed).  10/26/18  Yes Martin, [name], FNP  butalbital-acetaminophen-caffeine (FIORICET) 50-325-40 MG tablet TAKE 1 TABLET BY MOUTH EVERY 6 HOURS AS NEEDED FOR HEADACHE. Patient taking differently: Take 1 tablet by mouth every 6 (six) hours as needed for  headache.  12/13/18  Yes Daphine Deutscher, [name], FNP  carvedilol (COREG) 6.25 MG tablet TAKE (1) TABLET BY MOUTH FOUR TIMES DAILY AS NEEDED. Patient taking differently: Take 6.25 mg by mouth 4 (four) times daily.  01/04/19  Yes Martin, [name], FNP  DEXILANT 60 MG capsule TAKE 1 CAPSULE BY MOUTH ONCE DAILY. Patient taking differently: Take 60 mg by mouth daily.  01/04/19  Yes Martin, [name], FNP  diazepam (VALIUM) 5 MG tablet TAKE (1) TABLET TWICE DAILY. Patient taking differently: Take 5 mg by mouth 2 (two) times daily.  01/04/19  Yes Martin, [name], FNP  DULoxetine (CYMBALTA) 60 MG capsule Take 1 capsule (60 mg total) by mouth at bedtime. 10/26/18  Yes Martin, [name], FNP  escitalopram (LEXAPRO) 20 MG tablet TAKE (1) TABLET BY MOUTH ONCE DAILY. Patient taking differently: Take 20 mg by mouth daily.  10/26/18  Yes Daphine Deutscher, [name], FNP  estradiol (ESTRACE) 0.1 MG/GM vaginal cream PLACE 1 APPLICATORFUL VAGINALLY AT BEDTIME. 01/04/19  Yes Daphine Deutscher, [name], FNP  fludrocortisone (FLORINEF) 0.1 MG tablet TAKE 2 TABLETS BY MOUTH ONCE DAILY. Patient taking differently: Take 0.2 mg by mouth daily.  01/04/19  Yes Antoine Poche, MD  fluticasone (FLONASE) 50 MCG/ACT nasal spray 2 SPRAYS  INTO BOTH NOSTRILS DAILY. Patient taking differently: Place 2 sprays into both nostrils daily.  01/04/19  Yes Martin, [name], FNP  furosemide (LASIX) 40 MG tablet Take 1 tablet (40 mg total) by mouth daily. Patient taking differently: Take 40 mg by mouth daily as needed for fluid.  10/26/18  Yes Daphine Deutscher, [name], FNP  HYDROcodone-acetaminophen (NORCO) 7.5-325 MG tablet Take 1 tablet by mouth 2 (two) times a day. 02/10/19 03/12/19 Yes Martin, [name], FNP  ipratropium (ATROVENT) 0.02 % nebulizer solution USE 1 VIAL IN NEBULIZER EVERY 4 HOURS AS NEEDED FOR WHEEZING. Patient taking differently: Take 0.5 mg by nebulization every 4 (four) hours as needed for wheezing.  01/04/19  Yes Martin, [name], FNP  isosorbide mononitrate (IMDUR) 30 MG 24 hr tablet TAKE 1 TABLET ONCE DAILY. Patient taking differently: Take 30 mg by mouth daily.  01/04/19  Yes Martin, [name], FNP  lamoTRIgine (LAMICTAL) 150 MG tablet TAKE 1 TABLET BY MOUTH ONCE DAILY. Patient taking differently: Take 150 mg by mouth daily.  11/12/18  Yes Martin, [name], FNP  levETIRAcetam (KEPPRA) 500 MG tablet TAKE (1) TABLET TWICE DAILY. Patient taking differently: Take 500 mg by mouth 2 (two) times daily.  01/04/19  Yes Daphine Deutscher, [name], FNP  levocetirizine (XYZAL) 5 MG tablet TAKE 1 TABLET BY MOUTH EVERY MORNING. Patient taking differently: Take 5 mg by mouth daily.  12/09/18  Yes Daphine Deutscher, [name], FNP  lidocaine (XYLOCAINE) 5 % ointment Apply 1 application topically 3 (three) times daily as needed. (pain) Patient taking differently: Apply 1 application topically 3 (three) times daily as needed for mild pain or moderate pain.  12/24/18  Yes Daphine Deutscher, [name], FNP  nitroGLYCERIN (NITROSTAT) 0.4 MG SL tablet PLACE ONE (1) TABLET UNDER TONGUE EVERY 5 MINUTES UP TO (3) DOSES AS NEEDED FOR CHEST PAIN. Patient taking differently: Place 0.4 mg under the tongue every 5 (five) minutes as needed for chest pain.   12/02/18  Yes Martin, [name], FNP  ondansetron (ZOFRAN) 4 MG tablet TAKE 1 TABLET BY MOUTH EVERY 8 HOURS AS NEEDED FOR NAUSEA AND VOMITING. 01/04/19  Yes Daphine Deutscher, [name], FNP  oxybutynin (DITROPAN) 5 MG tablet Take 1 tablet (5 mg total) by mouth 2 (two) times daily. 10/26/18  Yes Daphine Deutscher, [name], FNP  rosuvastatin (CRESTOR) 10 MG tablet TAKE (  1) TABLET BY MOUTH ONCE DAILY. Patient taking differently: Take 10 mg by mouth daily.  10/26/18  Yes Martin, [name], FNP  sucralfate (CARAFATE) 1 g tablet TAKE 1 TABLET 4 TIMES DAILY - WITH MEALS AND AT BEDTIME Patient taking differently: Take 1 g by mouth 4 (four) times daily -  with meals and at bedtime.  01/04/19  Yes Daphine DeutscherMartin, [name], FNP  tiZANidine (ZANAFLEX) 4 MG tablet TAKE (1) TABLET EVERY SIX HOURS AS NEEDED FOR MUSCLE SPASMS. Patient taking differently: Take 4 mg by mouth every 6 (six) hours as needed for muscle spasms.  01/04/19  Yes Daphine DeutscherMartin, [name], FNP  tolterodine (DETROL) 2 MG tablet TAKE (1) TABLET TWICE DAILY. 10/26/18  Yes Daphine DeutscherMartin, [name], FNP  traZODone (DESYREL) 150 MG tablet Take 1 tablet (150 mg total) by mouth at bedtime. 10/26/18  Yes Daphine DeutscherMartin, [name], FNP  valACYclovir (VALTREX) 500 MG tablet TAKE (1) TABLET TWICE DAILY. Patient taking differently: Take 500 mg by mouth 2 (two) times daily.  01/04/19  Yes Daphine DeutscherMartin, [name], FNP  azithromycin (ZITHROMAX Z-PAK) 250 MG tablet 4 po now Patient not taking: Reported on 01/17/2019 12/24/18   Daphine DeutscherMartin, [name], FNP  cephALEXin (KEFLEX) 500 MG capsule Take 1 capsule (500 mg total) by mouth 2 (two) times daily. Patient not taking: Reported on 01/17/2019 12/16/18   Jannifer RodneyHawks, Christy A, FNP  Fluticasone-Umeclidin-Vilant (TRELEGY ELLIPTA) 100-62.5-25 MCG/INH AEPB Inhale 1 puff into the lungs daily. Patient not taking: Reported on 01/17/2019 10/26/18   Bennie PieriniMartin, [name], FNP  HYDROcodone-acetaminophen (NORCO) 7.5-325 MG tablet Take 1 tablet by mouth 2  (two) times a day. Patient not taking: Reported on 01/17/2019 01/11/19 02/10/19  Bennie PieriniMartin, [name], FNP  metroNIDAZOLE (FLAGYL) 500 MG tablet Take 1 tablet (500 mg total) by mouth 2 (two) times daily. Patient not taking: Reported on 01/17/2019 12/16/18   Jannifer RodneyHawks, Christy A, FNP  ondansetron (ZOFRAN) 4 MG tablet TAKE 1 TABLET BY MOUTH EVERY 8 HOURS AS NEEDED FOR NAUSEA OR VOMITING. Patient not taking: TAKE 1 TABLET BY MOUTH EVERY 8 HOURS AS NEEDED FOR NAUSEA OR VOMITING. 01/04/19   Bennie PieriniMartin, [name], FNP    Physical Exam: Vitals:   01/17/19 1930 01/17/19 2000 01/17/19 2030 01/17/19 2100  BP: (!) 188/89 (!) 173/102 (!) 173/88 (!) 158/88  Pulse: 70 71 71 70  Resp: 15 20 13 10   Temp:      TempSrc:      SpO2: 95% 92% 93% 94%  Weight:      Height:        Constitutional: NAD, calm, comfortable Vitals:   01/17/19 1930 01/17/19 2000 01/17/19 2030 01/17/19 2100  BP: (!) 188/89 (!) 173/102 (!) 173/88 (!) 158/88  Pulse: 70 71 71 70  Resp: 15 20 13 10   Temp:      TempSrc:      SpO2: 95% 92% 93% 94%  Weight:      Height:       Eyes: PERRL, lids and conjunctivae normal ENMT: Mucous membranes are moist. Posterior pharynx clear of any exudate or lesions.Normal dentition.  Neck: normal, supple, no masses, no thyromegaly Respiratory: clear to auscultation bilaterally, no wheezing, no crackles. Normal respiratory effort. No accessory muscle use.  Cardiovascular: No Tenderness elicited on palpation of left chest, regular rate and rhythm, no murmurs / rubs / gallops. No extremity edema. 2+ pedal pulses. No carotid bruits.  Abdomen: no tenderness, no masses palpated. No hepatosplenomegaly. Bowel sounds positive.  Musculoskeletal: no clubbing / cyanosis. No joint deformity upper and lower extremities. Good ROM, no contractures.  Normal muscle tone.  Skin: no rashes, lesions, ulcers. No induration Neurologic: CN 2-12 grossly intact. Sensation intact, DTR normal. Strength 5/5 in all 4.   Psychiatric: Normal judgment and insight. Alert and oriented x 3. Normal mood.   Labs on Admission: I have personally reviewed following labs and imaging studies  CBC: Recent Labs  Lab 01/17/19 1219  WBC 7.5  HGB 11.8*  HCT 41.0  MCV 85.1  PLT 200   Basic Metabolic Panel: Recent Labs  Lab 01/17/19 1219  NA 141  K 3.6  CL 103  CO2 28  GLUCOSE 113*  BUN 10  CREATININE 0.81  CALCIUM 9.1     Radiological Exams on Admission: Ct Angio Chest Pe W And/or Wo Contrast  Result Date: 01/17/2019 CLINICAL DATA:  Short of breath. Elevated D-dimer. Concern for pulmonary embolism. EXAM: CT ANGIOGRAPHY CHEST WITH CONTRAST TECHNIQUE: Multidetector CT imaging of the chest was performed using the standard protocol during bolus administration of intravenous contrast. Multiplanar CT image reconstructions and MIPs were obtained to evaluate the vascular anatomy. CONTRAST:  OMNIPAQUE IOHEXOL 350 MG/ML SOLN COMPARISON:  None. FINDINGS: Cardiovascular: No filling defects within the pulmonary arteries to suggest acute pulmonary embolism. No acute findings of the aorta or great vessels. No pericardial fluid. Mediastinum/Nodes: No axillary or supraclavicular adenopathy. No mediastinal hilar adenopathy. No pericardial effusion. Large hiatal hernia posterior the heart Lungs/Pleura: No pulmonary infarction. No pneumonia. There is linear bibasilar atelectasis. Small pulmonary nodule measuring 4 mm in the RIGHT upper lobe (image 61/6). Several peripheral nodules in the RIGHT lower lobe and a branching pattern (image 71/6) suggests mild or resolving infectious process. No evidence acute pneumonia. Upper Abdomen: Limited view of the liver, kidneys, pancreas are unremarkable. Normal adrenal glands. Postcholecystectomy Musculoskeletal: No aggressive osseous lesion Review of the MIP images confirms the above findings. IMPRESSION: 1. No evidence acute pulmonary embolism. 2. Large hiatal hernia. 3. Several small  peripheral nodules in the RIGHT lower lobe suggest mild or resolving infectious process. 4. Small nodule in the RIGHT upper lobe. No follow-up needed if patient is low-risk. Non-contrast chest CT can be considered in 12 months if patient is high-risk. This recommendation follows the consensus statement: Guidelines for Management of Incidental Pulmonary Nodules Detected on CT Images: From the Fleischner Society 2017; Radiology 2017; 284:228-243. Electronically Signed   By: Genevive Bi M.D.   On: 01/17/2019 17:01   Dg Chest Portable 1 View  Result Date: 01/17/2019 CLINICAL DATA:  Chest pain EXAM: PORTABLE CHEST 1 VIEW COMPARISON:  May 17, 2018 FINDINGS: No edema or consolidation. Heart is upper normal in size with pulmonary vascularity normal. No adenopathy. There is a focal hiatal hernia. There is a loop recorder on the left. No bone lesions. IMPRESSION: No edema or consolidation. Heart upper normal in size. Focal hiatal hernia. Loop recorder on left. Electronically Signed   By: Bretta Bang III M.D.   On: 01/17/2019 12:53    EKG: Independently reviewed.   Assessment/Plan Active Problems:   Chest pain    Chest pain-atypical.  She reports history of heart attack when she was 63 years old no stents placed.  Differentials include anxiety related versus musculoskeletal.  CTA chest shows several small peripheral nodules on the right lower lobe suggesting mild or resolving infectious process-doubt this is related to her symptoms.  She reports difficulty breathing with her chest pain otherwise she has no respiratory symptoms afebrile.  WBC 7.5. - UDS -Trend troponin -Echocardiogram - Will treat with azithromycin orally 500  mg daily -N.p.o. at midnight  Asthma, bipolar disorder, fibromyalgia, generalized anxiety disorders-stable, but may be contributing to patient's chest pain.  Reports poor sleep despite trazodone 150 mg QHs -Resume home Lexapro, duloxetine, Valium 5 mg twice daily,  trazodone 150 mg nightly.  History of seizures-reports last seizure about a year ago, from not taking her medication. -Resume home Lamictal and Keppra  Hypertension-elevated.  History of same.  With well fluctuations in blood pressure.  Has a loop recorder for evaluation-since March.  Also on Florinef.  Follows with cardiology.  Per last note 10/16 hold Florinef as her blood pressure was elevated. -Hold Florinef for now. -Resume home Imdur, Coreg  HSV -Resume home valacyclovir.  DVT prophylaxis: Lovenox Code Status: Full code Family Communication: None at bedside Disposition Plan: 1 to 2 days Consults called: none Admission status: Obs, telemetry  Onnie Boer MD [facility]  01/17/2019, 9:49 PM

## 2019-01-17 NOTE — Progress Notes (Signed)
Pt called back - says she was at the laundry mat and broke out in cold sweat/nauseated and worsening chest pain - pt will have daughter to take her to [facility] for evaluation

## 2019-01-17 NOTE — ED Notes (Signed)
Pt given a snack and drink. Pt resting comfortably awaiting admitting MD.

## 2019-01-17 NOTE — ED Provider Notes (Signed)
Patient signed out to me by J. Eitel, PA-C.  Please see previous notes for further history.  In brief, patient presented for evaluation of chest pain.  5-day history of consistent left-sided chest pain.  She describes as a pressure, however has sharp pain with inspiration.  CT scan was negative for PE.  She does have a history of angina and autonomic dystonia, in which she has syncopal episodes for which he takes Florinef.  She has a loop recorder on which has been present since March, however she has not followed up with her cardiologist, Dr. Harl Bowie, due to concerns for Covid.  Patient remains with chest pain despite treatment.  She did have a cath in 2016 which was clear.  Heart score 4.  During shared decision making, it was decided that patient would be admitted for cardiac observation.  Pending admission.  I discussed with Dr. Theresa Duty, who states she will consult and decide if patient needs to be admitted.  Pt admitted by [facility] for ACS r/o.        Franchot Heidelberg, PA-C 01/17/19 2355    Margette Fast, MD 01/18/19 1245

## 2019-01-17 NOTE — Progress Notes (Signed)
EKG shows normal heart rhythm and rate. Bp's are elevated but better than reported last week. She does need to be evaluated for the chest pressure and I agree with ER evaluation.   Zandra Abts MD

## 2019-01-17 NOTE — ED Provider Notes (Signed)
[facility] EMERGENCY DEPARTMENT Provider Note   CSN: [id number] Arrival date & time: 01/17/19  1132     History   Chief Complaint Chief Complaint  Patient presents with  . Chest Pain    HPI [name] is a 63 y.o. female with a history of angina,  CAD with reported h/o MI , asthma,  h/o SVT, GERD, HTN and h/o syncope followed by Dr. Ladona Ridgel and Wyline Mood and currently has a a loop recorder to assess for possible autonomic source of significant fluctuations in blood pressures (last seen for this in March with no f/u since as pt has missed appts to avoid covid exposures).  She presents with a 5 day history of left sided chest pressure which is constant, aching at rest, sharp with radiation into her back with deep inspiration. In addition, she states having intermittent episodes of tachycardia to the 130's.  She states her symptoms are different from her MI but cannot describe the difference.  She has been hesitant to take her nitroglycerin as the last time she took this medication it dropped her bp to SBP of 50.  She does take Imdur daily but has not taken today.  She also reports shortness of breath, worse with exertion but chronic and not worsened today. Also describes chronic cold sweats worse with exertion which has been present for months.   She had a negative Myoview in 2016 when admitted at [facility] for chest pain and no ischemia seen on nuclear stress testing in 2019 at which time she was placed on Imdur.  Was seen in Dr. Verna Czech office this am for a blood pressure/ekg check and sent here for further evaluation of her chest pain symptoms.  HPI: A 63 year old patient with a history of hypertension presents for evaluation of chest pain. Initial onset of pain was more than 6 hours ago. The patient's chest pain is sharp and is not worse with exertion. The patient reports some diaphoresis. The patient's chest pain is middle- or left-sided, is not well-localized, is not described as  heaviness/pressure/tightness and does not radiate to the arms/jaw/neck. The patient does not complain of nausea. The patient has no history of stroke, has no history of peripheral artery disease, has not smoked in the past 90 days, denies any history of treated diabetes, has no relevant family history of coronary artery disease (first degree relative at less than age 19), has no history of hypercholesterolemia and does not have an elevated BMI (>=30).   The history is provided by the patient.  Chest Pain Associated symptoms: diaphoresis and shortness of breath   Associated symptoms: no abdominal pain, no dizziness, no fever, no headache, no nausea, no numbness, no vomiting and no weakness     Past Medical History:  Diagnosis Date  . Anginal pain (HCC)    last time   . Anxiety   . Arthritis    RHEUMATOID  . Asthma   . Bipolar 1 disorder (HCC)   . Cataracts, bilateral 07/2017  . COPD (chronic obstructive pulmonary disease) (HCC)   . Coronary artery disease    reported hx of "MI";  Echo 2009 with normal LVF;  Myoview 05/2011: no ischemia  . Depression   . Dyslipidemia   . Dysrhythmia    SVT  . Esophageal stricture   . Fibromyalgia   . GERD (gastroesophageal reflux disease)   . H/O hiatal hernia   . Head injury, unspecified   . Herpes simplex infection   . History of kidney  stones   . Hyperlipidemia   . Hypertension   . Insomnia   . Myocardial infarction [facility])    age 63  . Osteoporosis   . Pneumonia    hx  . Seizures (HCC)    last 3 weeks ago- prescribed keppra -never took didnt like side effects read about online  . Shortness of breath   . Sleep apnea    ? neg  . Spinal stenosis of lumbar region   . Spondylolisthesis   . Status post placement of implantable loop recorder   . Supraventricular tachycardia (HCC)   . Syncope and collapse    s/p ILR; no arhythmogenic cause identified  . UTI (lower urinary tract infection)     Patient Active Problem List   Diagnosis Date  Noted  . Unstable angina (HCC) 10/20/2017  . Encounter for chronic pain management 09/23/2016  . Pre-diabetes 09/02/2016  . Recurrent major depressive disorder, in partial remission (HCC) 07/14/2016  . Seizures (HCC) 07/14/2016  . GAD (generalized anxiety disorder) 07/14/2016  . Insomnia 09/05/2014  . Allergic rhinitis 09/05/2014  . Cervical spondylosis without myelopathy 12/19/2013    Class: Chronic  . Neural foraminal stenosis of cervical spine 12/19/2013  . Cervical radiculitis 09/19/2013  . Lumbosacral spondylosis without myelopathy 11/16/2012  . Postlaminectomy syndrome, lumbar region 11/16/2012  . Herpes simplex virus (HSV) infection 10/31/2008  . Hyperlipidemia with target LDL less than 100 10/31/2008  . Essential hypertension 10/31/2008  . GERD 10/31/2008  . SPINAL STENOSIS OF LUMBAR REGION 10/31/2008  . Myalgia 10/31/2008  . Osteoporosis 10/31/2008  . SPONDYLOLISTHESIS 10/31/2008  . SYNCOPE 10/31/2008  . Sleep apnea 10/31/2008    Past Surgical History:  Procedure Laterality Date  . BACK SURGERY    . BREAST SURGERY     lumpectomy  . CATARACT EXTRACTION W/PHACO Right 07/31/2017   Procedure: CATARACT EXTRACTION PHACO AND INTRAOCULAR LENS PLACEMENT (IOC);  Surgeon: Fabio Pierce, MD;  Location: [facility];  Service: Ophthalmology;  Laterality: Right;  CDE: 2.33  . CATARACT EXTRACTION W/PHACO Left 08/14/2017   Procedure: CATARACT EXTRACTION PHACO AND INTRAOCULAR LENS PLACEMENT (IOC);  Surgeon: Fabio Pierce, MD;  Location: [facility];  Service: Ophthalmology;  Laterality: Left;  CDE: 2.74  . CHOLECYSTECTOMY    . CYSTOSCOPY     stone  . DOPPLER ECHOCARDIOGRAPHY  2009  . head up tilt table testing  06/15/2007   [name]  . HEMORRHOID SURGERY    . insertion of implatable loop recorder  08/11/2007   [name]  . POSTERIOR CERVICAL FUSION/FORAMINOTOMY N/A 12/19/2013   Procedure: RIGHT C3-4.C4-5 AND C5-6 FORAMINOTOMIES;  Surgeon: Kerrin Champagne, MD;  Location: [facility] OR;  Service:  Orthopedics;  Laterality: N/A;  . TUBAL LIGATION       OB History   No obstetric history on file.      Home Medications    Prior to Admission medications   Medication Sig Start Date End Date Taking? Authorizing Provider  acyclovir ointment (ZOVIRAX) 5 % APPLY TO AFFECTED AREA EVERY 3 HOURS Patient taking differently: Apply 1 application topically daily as needed (to affected area(s)).  01/04/19  Yes Daphine Deutscher, [name], FNP  albuterol (PROVENTIL) (2.5 MG/3ML) 0.083% nebulizer solution USE 1 VIAL IN NEBULIZER EVERY 4 HOURS AS NEEDED FOR WHEEZING. Patient taking differently: Take 2.5 mg by nebulization every 4 (four) hours as needed for wheezing.  05/03/18  Yes Daphine Deutscher, [name], FNP  albuterol (VENTOLIN HFA) 108 (90 Base) MCG/ACT inhaler INHALE 2 PUFFS EVERY 6 HOURS AS NEEDED FOR SHORTNESS  OF BREATH AND WHEEZING. Patient taking differently: Inhale 2 puffs into the lungs every 6 (six) hours as needed for wheezing or shortness of breath.  01/04/19  Yes Martin, [name], FNP  BREO ELLIPTA 200-25 MCG/INH AEPB INHALE 1 PUFF DAILY. Patient taking differently: Inhale 1 puff into the lungs daily.  01/04/19  Yes Martin, [name], FNP  busPIRone (BUSPAR) 10 MG tablet 1 po bid prn Patient taking differently: Take 10 mg by mouth 2 (two) times daily as needed (for mood/ as directed).  10/26/18  Yes Martin, [name], FNP  butalbital-acetaminophen-caffeine (FIORICET) 50-325-40 MG tablet TAKE 1 TABLET BY MOUTH EVERY 6 HOURS AS NEEDED FOR HEADACHE. Patient taking differently: Take 1 tablet by mouth every 6 (six) hours as needed for headache.  12/13/18  Yes Daphine DeutscherMartin, [name], FNP  carvedilol (COREG) 6.25 MG tablet TAKE (1) TABLET BY MOUTH FOUR TIMES DAILY AS NEEDED. Patient taking differently: Take 6.25 mg by mouth 4 (four) times daily.  01/04/19  Yes Martin, [name], FNP  DEXILANT 60 MG capsule TAKE 1 CAPSULE BY MOUTH ONCE DAILY. Patient taking differently: Take 60 mg by mouth  daily.  01/04/19  Yes Martin, [name], FNP  diazepam (VALIUM) 5 MG tablet TAKE (1) TABLET TWICE DAILY. Patient taking differently: Take 5 mg by mouth 2 (two) times daily.  01/04/19  Yes Martin, [name], FNP  DULoxetine (CYMBALTA) 60 MG capsule Take 1 capsule (60 mg total) by mouth at bedtime. 10/26/18  Yes Martin, [name], FNP  escitalopram (LEXAPRO) 20 MG tablet TAKE (1) TABLET BY MOUTH ONCE DAILY. Patient taking differently: Take 20 mg by mouth daily.  10/26/18  Yes Daphine DeutscherMartin, [name], FNP  estradiol (ESTRACE) 0.1 MG/GM vaginal cream PLACE 1 APPLICATORFUL VAGINALLY AT BEDTIME. 01/04/19  Yes Daphine DeutscherMartin, [name], FNP  fludrocortisone (FLORINEF) 0.1 MG tablet TAKE 2 TABLETS BY MOUTH ONCE DAILY. Patient taking differently: Take 0.2 mg by mouth daily.  01/04/19  Yes Antoine PocheBranch, Jonathan F, MD  fluticasone (FLONASE) 50 MCG/ACT nasal spray 2 SPRAYS INTO BOTH NOSTRILS DAILY. Patient taking differently: Place 2 sprays into both nostrils daily.  01/04/19  Yes Martin, [name], FNP  furosemide (LASIX) 40 MG tablet Take 1 tablet (40 mg total) by mouth daily. Patient taking differently: Take 40 mg by mouth daily as needed for fluid.  10/26/18  Yes Daphine DeutscherMartin, [name], FNP  HYDROcodone-acetaminophen (NORCO) 7.5-325 MG tablet Take 1 tablet by mouth 2 (two) times a day. 02/10/19 03/12/19 Yes Martin, [name], FNP  ipratropium (ATROVENT) 0.02 % nebulizer solution USE 1 VIAL IN NEBULIZER EVERY 4 HOURS AS NEEDED FOR WHEEZING. Patient taking differently: Take 0.5 mg by nebulization every 4 (four) hours as needed for wheezing.  01/04/19  Yes Martin, [name], FNP  isosorbide mononitrate (IMDUR) 30 MG 24 hr tablet TAKE 1 TABLET ONCE DAILY. Patient taking differently: Take 30 mg by mouth daily.  01/04/19  Yes Martin, [name], FNP  lamoTRIgine (LAMICTAL) 150 MG tablet TAKE 1 TABLET BY MOUTH ONCE DAILY. Patient taking differently: Take 150 mg by mouth daily.  11/12/18  Yes Martin,  [name], FNP  levETIRAcetam (KEPPRA) 500 MG tablet TAKE (1) TABLET TWICE DAILY. Patient taking differently: Take 500 mg by mouth 2 (two) times daily.  01/04/19  Yes Daphine DeutscherMartin, [name], FNP  levocetirizine (XYZAL) 5 MG tablet TAKE 1 TABLET BY MOUTH EVERY MORNING. Patient taking differently: Take 5 mg by mouth daily.  12/09/18  Yes Martin, [name], FNP  lidocaine (XYLOCAINE) 5 % ointment Apply 1 application topically 3 (three) times daily as needed. (pain) Patient taking differently: Apply 1 application  topically 3 (three) times daily as needed for mild pain or moderate pain.  12/24/18  Yes Hassell Done, [name], FNP  nitroGLYCERIN (NITROSTAT) 0.4 MG SL tablet PLACE ONE (1) TABLET UNDER TONGUE EVERY 5 MINUTES UP TO (3) DOSES AS NEEDED FOR CHEST PAIN. Patient taking differently: Place 0.4 mg under the tongue every 5 (five) minutes as needed for chest pain.  12/02/18  Yes Martin, [name], FNP  ondansetron (ZOFRAN) 4 MG tablet TAKE 1 TABLET BY MOUTH EVERY 8 HOURS AS NEEDED FOR NAUSEA AND VOMITING. 01/04/19  Yes Hassell Done, [name], FNP  oxybutynin (DITROPAN) 5 MG tablet Take 1 tablet (5 mg total) by mouth 2 (two) times daily. 10/26/18  Yes Martin, [name], FNP  rosuvastatin (CRESTOR) 10 MG tablet TAKE (1) TABLET BY MOUTH ONCE DAILY. Patient taking differently: Take 10 mg by mouth daily.  10/26/18  Yes Martin, [name], FNP  sucralfate (CARAFATE) 1 g tablet TAKE 1 TABLET 4 TIMES DAILY - WITH MEALS AND AT BEDTIME Patient taking differently: Take 1 g by mouth 4 (four) times daily -  with meals and at bedtime.  01/04/19  Yes Hassell Done, [name], FNP  tiZANidine (ZANAFLEX) 4 MG tablet TAKE (1) TABLET EVERY SIX HOURS AS NEEDED FOR MUSCLE SPASMS. Patient taking differently: Take 4 mg by mouth every 6 (six) hours as needed for muscle spasms.  01/04/19  Yes Hassell Done, [name], FNP  tolterodine (DETROL) 2 MG tablet TAKE (1) TABLET TWICE DAILY. 10/26/18  Yes Hassell Done, [name], FNP   traZODone (DESYREL) 150 MG tablet Take 1 tablet (150 mg total) by mouth at bedtime. 10/26/18  Yes Hassell Done, [name], FNP  valACYclovir (VALTREX) 500 MG tablet TAKE (1) TABLET TWICE DAILY. Patient taking differently: Take 500 mg by mouth 2 (two) times daily.  01/04/19  Yes Hassell Done, [name], FNP  azithromycin (ZITHROMAX Z-PAK) 250 MG tablet 4 po now Patient not taking: Reported on 01/17/2019 12/24/18   Hassell Done, [name], FNP  cephALEXin (KEFLEX) 500 MG capsule Take 1 capsule (500 mg total) by mouth 2 (two) times daily. Patient not taking: Reported on 01/17/2019 12/16/18   Evelina Dun A, FNP  Fluticasone-Umeclidin-Vilant (TRELEGY ELLIPTA) 100-62.5-25 MCG/INH AEPB Inhale 1 puff into the lungs daily. Patient not taking: Reported on 01/17/2019 10/26/18   Chevis Pretty, FNP  HYDROcodone-acetaminophen (NORCO) 7.5-325 MG tablet Take 1 tablet by mouth 2 (two) times a day. Patient not taking: Reported on 01/17/2019 01/11/19 02/10/19  Chevis Pretty, FNP  metroNIDAZOLE (FLAGYL) 500 MG tablet Take 1 tablet (500 mg total) by mouth 2 (two) times daily. Patient not taking: Reported on 01/17/2019 12/16/18   Evelina Dun A, FNP  ondansetron (ZOFRAN) 4 MG tablet TAKE 1 TABLET BY MOUTH EVERY 8 HOURS AS NEEDED FOR NAUSEA OR VOMITING. Patient not taking: TAKE 1 TABLET BY MOUTH EVERY 8 HOURS AS NEEDED FOR NAUSEA OR VOMITING. 01/04/19   Chevis Pretty, FNP    Family History Family History  Problem Relation Age of Onset  . Heart attack Father   . Cancer Father   . Mental illness Father   . Cancer Mother   . Mental illness Mother   . Heart attack Brother        stents  . Alcohol abuse Brother   . Heart disease Brother   . Drug abuse Brother   . Diabetes Brother   . Colon cancer Maternal Aunt   . Cirrhosis Brother   . Stomach cancer Neg Hx     Social History Social History   Tobacco Use  . Smoking status: Never Smoker  .  Smokeless tobacco: Never Used  Substance Use  Topics  . Alcohol use: No    Alcohol/week: 0.0 standard drinks  . Drug use: No     Allergies   Codeine, Morphine and related, Ambien [zolpidem tartrate], Lyrica [pregabalin], and Neurontin [gabapentin]   Review of Systems Review of Systems  Constitutional: Positive for diaphoresis. Negative for fever.  HENT: Negative for congestion and sore throat.   Eyes: Negative.   Respiratory: Positive for shortness of breath. Negative for chest tightness.   Cardiovascular: Positive for chest pain.  Gastrointestinal: Negative for abdominal pain, nausea and vomiting.  Genitourinary: Negative.   Musculoskeletal: Negative for arthralgias, joint swelling and neck pain.  Skin: Negative.  Negative for rash and wound.  Neurological: Negative for dizziness, weakness, light-headedness, numbness and headaches.  Psychiatric/Behavioral: Negative.      Physical Exam Updated Vital Signs BP (!) 162/90   Pulse 65   Temp 98.5 F (36.9 C) (Oral)   Resp 15   Ht 5\' 2"  (1.575 m)   Wt 68 kg   SpO2 94%   BMI 27.44 kg/m   Physical Exam Vitals signs and nursing note reviewed.  Constitutional:      Appearance: She is well-developed.  HENT:     Head: Normocephalic and atraumatic.  Eyes:     Conjunctiva/sclera: Conjunctivae normal.  Neck:     Musculoskeletal: Normal range of motion.  Cardiovascular:     Rate and Rhythm: Normal rate and regular rhythm.     Heart sounds: Normal heart sounds.  Pulmonary:     Effort: Pulmonary effort is normal.     Breath sounds: Normal breath sounds. No wheezing.  Abdominal:     General: Bowel sounds are normal.     Palpations: Abdomen is soft.     Tenderness: There is no abdominal tenderness.  Musculoskeletal: Normal range of motion.  Skin:    General: Skin is warm and dry.  Neurological:     Mental Status: She is alert.      ED Treatments / Results  Labs (all labs ordered are listed, but only abnormal results are displayed) Labs Reviewed  BASIC  METABOLIC PANEL - Abnormal; Notable for the following components:      Result Value   Glucose, Bld 113 (*)    All other components within normal limits  CBC - Abnormal; Notable for the following components:   Hemoglobin 11.8 (*)    MCH 24.5 (*)    MCHC 28.8 (*)    RDW 15.9 (*)    All other components within normal limits  D-DIMER, QUANTITATIVE (NOT AT [facility]) - Abnormal; Notable for the following components:   D-Dimer, Quant 1.01 (*)    All other components within normal limits  TROPONIN I (HIGH SENSITIVITY)  TROPONIN I (HIGH SENSITIVITY)    EKG EKG Interpretation  Date/Time:  Monday January 17 2019 11:48:23 EDT Ventricular Rate:  70 PR Interval:    QRS Duration: 83 QT Interval:  432 QTC Calculation: 467 R Axis:   -17 Text Interpretation:  Sinus rhythm Left ventricular hypertrophy Since last tracing LVH now seen. Confirmed by Eber HongMiller, Brian ([id number]) on 01/17/2019 1:28:46 PM   Radiology Ct Angio Chest Pe W And/or Wo Contrast  Result Date: 01/17/2019 CLINICAL DATA:  Short of breath. Elevated D-dimer. Concern for pulmonary embolism. EXAM: CT ANGIOGRAPHY CHEST WITH CONTRAST TECHNIQUE: Multidetector CT imaging of the chest was performed using the standard protocol during bolus administration of intravenous contrast. Multiplanar CT image reconstructions and MIPs were obtained to  evaluate the vascular anatomy. CONTRAST:  OMNIPAQUE IOHEXOL 350 MG/ML SOLN COMPARISON:  None. FINDINGS: Cardiovascular: No filling defects within the pulmonary arteries to suggest acute pulmonary embolism. No acute findings of the aorta or great vessels. No pericardial fluid. Mediastinum/Nodes: No axillary or supraclavicular adenopathy. No mediastinal hilar adenopathy. No pericardial effusion. Large hiatal hernia posterior the heart Lungs/Pleura: No pulmonary infarction. No pneumonia. There is linear bibasilar atelectasis. Small pulmonary nodule measuring 4 mm in the RIGHT upper lobe (image 61/6). Several  peripheral nodules in the RIGHT lower lobe and a branching pattern (image 71/6) suggests mild or resolving infectious process. No evidence acute pneumonia. Upper Abdomen: Limited view of the liver, kidneys, pancreas are unremarkable. Normal adrenal glands. Postcholecystectomy Musculoskeletal: No aggressive osseous lesion Review of the MIP images confirms the above findings. IMPRESSION: 1. No evidence acute pulmonary embolism. 2. Large hiatal hernia. 3. Several small peripheral nodules in the RIGHT lower lobe suggest mild or resolving infectious process. 4. Small nodule in the RIGHT upper lobe. No follow-up needed if patient is low-risk. Non-contrast chest CT can be considered in 12 months if patient is high-risk. This recommendation follows the consensus statement: Guidelines for Management of Incidental Pulmonary Nodules Detected on CT Images: From the Fleischner Society 2017; Radiology 2017; 284:228-243. Electronically Signed   By: Genevive Bi M.D.   On: 01/17/2019 17:01   Dg Chest Portable 1 View  Result Date: 01/17/2019 CLINICAL DATA:  Chest pain EXAM: PORTABLE CHEST 1 VIEW COMPARISON:  May 17, 2018 FINDINGS: No edema or consolidation. Heart is upper normal in size with pulmonary vascularity normal. No adenopathy. There is a focal hiatal hernia. There is a loop recorder on the left. No bone lesions. IMPRESSION: No edema or consolidation. Heart upper normal in size. Focal hiatal hernia. Loop recorder on left. Electronically Signed   By: Bretta Bang III M.D.   On: 01/17/2019 12:53    Procedures Procedures (including critical care time)  Medications Ordered in ED Medications  0.9 %  sodium chloride infusion ( Intravenous New Bag/Given 01/17/19 1331)  HYDROcodone-acetaminophen (NORCO/VICODIN) 5-325 MG per tablet 1 tablet (has no administration in time range)  aspirin chewable tablet 324 mg (324 mg Oral Given 01/17/19 1331)  iohexol (OMNIPAQUE) 350 MG/ML injection 100 mL (100 mLs  Intravenous Contrast Given 01/17/19 1649)     Initial Impression / Assessment and Plan / ED Course  I have reviewed the triage vital signs and the nursing notes.  Pertinent labs & imaging results that were available during my care of the patient were reviewed by me and considered in my medical decision making (see chart for details).     HEAR Score: 4  Pt with h/o CAD/MI with 5 day history of constant left sided chest pressure, pleuritic in character, associated with sob and diaphoresis which are more chronic findings.  Her delta troponins are negative today, cxr reviewed, ekg with new LVH, otherwise stable.  Elevated d dimer.   CT angio negative.  Call placed to Dr. Wyline Mood, pending return call. Also placed cal to hospitalists, awaiting return call.    Pt with heart score of 4, sign risk factors for unstable angina  who would benefit from overnight observation. Discussed with Arlice Colt PA-C who assumes care.  Final Clinical Impressions(s) / ED Diagnoses   Final diagnoses:  Chest pain, unspecified type    ED Discharge Orders    None       Victoriano Lain 01/17/19 1749    Eber Hong, MD 01/18/19  0807  

## 2019-01-17 NOTE — ED Notes (Signed)
ED Provider at bedside. 

## 2019-01-17 NOTE — Progress Notes (Signed)
Pt here for EKG per phone note 10/16 BP today bu nurse check 170/84 - EKG done HR was 66 - pt has not taken florinef since Friday - pt denies any new symptoms and c/o "chest pressure" as stated previously for the last 4 days.

## 2019-01-17 NOTE — ED Triage Notes (Signed)
CP on LT side of chest x 5 days

## 2019-01-18 ENCOUNTER — Other Ambulatory Visit (HOSPITAL_COMMUNITY): Payer: Self-pay | Admitting: *Deleted

## 2019-01-18 ENCOUNTER — Observation Stay (HOSPITAL_BASED_OUTPATIENT_CLINIC_OR_DEPARTMENT_OTHER): Payer: 59

## 2019-01-18 DIAGNOSIS — R079 Chest pain, unspecified: Secondary | ICD-10-CM | POA: Diagnosis not present

## 2019-01-18 DIAGNOSIS — R55 Syncope and collapse: Secondary | ICD-10-CM

## 2019-01-18 DIAGNOSIS — R0789 Other chest pain: Secondary | ICD-10-CM

## 2019-01-18 DIAGNOSIS — I10 Essential (primary) hypertension: Secondary | ICD-10-CM | POA: Diagnosis not present

## 2019-01-18 LAB — TROPONIN I (HIGH SENSITIVITY): Troponin I (High Sensitivity): 10 ng/L (ref <18)

## 2019-01-18 LAB — ECHOCARDIOGRAM COMPLETE
Height: 62 in
Weight: 2400 oz

## 2019-01-18 LAB — HIV ANTIBODY (ROUTINE TESTING W REFLEX): HIV Screen 4th Generation wRfx: NONREACTIVE

## 2019-01-18 LAB — SARS CORONAVIRUS 2 (TAT 6-24 HRS): SARS Coronavirus 2: NEGATIVE

## 2019-01-18 MED ORDER — CARVEDILOL 3.125 MG PO TABS
6.2500 mg | ORAL_TABLET | Freq: Two times a day (BID) | ORAL | Status: DC
  Administered 2019-01-18 – 2019-01-19 (×2): 6.25 mg via ORAL
  Filled 2019-01-18 (×2): qty 2

## 2019-01-18 MED ORDER — KETOROLAC TROMETHAMINE 30 MG/ML IJ SOLN
30.0000 mg | Freq: Once | INTRAMUSCULAR | Status: AC
  Administered 2019-01-18: 30 mg via INTRAVENOUS
  Filled 2019-01-18: qty 1

## 2019-01-18 NOTE — Consult Note (Signed)
Cardiology Consultation:   Patient ID: [name] MRN: [id number]; DOB: Nov 28, 1955  Admit date: 01/17/2019 Date of Consult: 01/18/2019  Primary Care Provider: Bennie PieriniMartin, [name], FNP Primary Cardiologist: Dina RichBranch, Jonathan, MD  Primary Electrophysiologist:  None    Patient Profile:   [name] Rantz is a 63 y.o. female with a hx of who HTN, bipolar, COPD, neurally mediated syncope, chronic chest pain is being seen today for the evaluation of chest pain at the request of Dr Gwenlyn PerkingMadera.  History of Present Illness:   Ms. 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   She has a several year prior history of chest pain. Cath in 2013 without significant disease. [facility] negative ACS workup, follow up outpatient lexiscan was normal. 09/2017 stress test without ischemia. She emperically was started on imdur as an outpatient by her pcp with some improved symptoms.   Presents with 6 days of left sided chest pain. Constant over that period of time, worst with deep breathing. Unable to describe the character of the pain, severity is 6/10. Some associated SOB.   K 3.6 Cr 0.81 BUN 10 WBC 7.5 Hgb 11.8 Plt 200 Ddimer 1.01  hxtrop 7-->710-->10 EKG SR, no acute ischemic changes CXR no acute process  Heart Pathway Score:  HEAR Score: 4  Past Medical History:  Diagnosis Date  . Anginal pain (HCC)    last time   . Anxiety   . Arthritis    RHEUMATOID  . Asthma   . Bipolar 1 disorder (HCC)   . Cataracts, bilateral 07/2017  . COPD (chronic obstructive pulmonary disease) (HCC)   . Coronary artery disease    reported hx of "MI";  Echo 2009 with normal LVF;  Myoview 05/2011: no ischemia  . Depression   . Dyslipidemia   . Dysrhythmia    SVT  . Esophageal stricture   . Fibromyalgia   . GERD (gastroesophageal reflux disease)   . H/O hiatal  hernia   . Head injury, unspecified   . Herpes simplex infection   . History of kidney stones   . Hyperlipidemia   . Hypertension   . Insomnia   . Myocardial infarction [facility](HCC)    age 63  . Osteoporosis   . Pneumonia    hx  . Seizures (HCC)    last 3 weeks ago- prescribed keppra -never took didnt like side effects read about online  . Shortness of breath   . Sleep apnea    ? neg  . Spinal stenosis of lumbar region   . Spondylolisthesis   . Status post placement of implantable loop recorder   . Supraventricular tachycardia (HCC)   . Syncope and collapse    s/p ILR; no arhythmogenic cause identified  . UTI (lower urinary tract infection)     Past Surgical History:  Procedure Laterality Date  . BACK SURGERY    . BREAST SURGERY     lumpectomy  . CATARACT EXTRACTION W/PHACO Right 07/31/2017   Procedure: CATARACT EXTRACTION PHACO AND INTRAOCULAR LENS PLACEMENT (IOC);  Surgeon: Fabio PierceWrzosek, James, MD;  Location: [facility];  Service: Ophthalmology;  Laterality: Right;  CDE: 2.33  . CATARACT EXTRACTION W/PHACO Left 08/14/2017   Procedure: CATARACT EXTRACTION PHACO AND INTRAOCULAR LENS PLACEMENT (IOC);  Surgeon: Fabio PierceWrzosek, James, MD;  Location: [facility];  Service: Ophthalmology;  Laterality: Left;  CDE: 2.74  . CHOLECYSTECTOMY    .  CYSTOSCOPY     stone  . DOPPLER ECHOCARDIOGRAPHY  2009  . head up tilt table testing  06/15/2007   Erica BuntingGregg [name]  . HEMORRHOID SURGERY    . insertion of implatable loop recorder  08/11/2007   Erica BuntingGregg [name]  . POSTERIOR CERVICAL FUSION/FORAMINOTOMY N/A 12/19/2013   Procedure: RIGHT C3-4.C4-5 AND C5-6 FORAMINOTOMIES;  Surgeon: Kerrin ChampagneJames E Nitka, MD;  [facility];  Service: Orthopedics;  Laterality: N/A;  . TUBAL LIGATION       Inpatient Medications: Scheduled Meds: . azithromycin  500 mg Oral Daily  . carvedilol  6.25 mg Oral QID  . diazepam  5 mg Oral BID  . DULoxetine  60 mg Oral QHS  . enoxaparin (LOVENOX) injection  40 mg Subcutaneous QHS  . escitalopram  20 mg  Oral Daily  . fluticasone furoate-vilanterol  1 puff Inhalation Daily  . isosorbide mononitrate  30 mg Oral Daily  . lamoTRIgine  150 mg Oral Daily  . levETIRAcetam  500 mg Oral BID  . loratadine  10 mg Oral Daily  . oxybutynin  5 mg Oral BID  . pantoprazole  40 mg Oral Daily  . rosuvastatin  10 mg Oral Daily  . sucralfate  1 g Oral TID WC & HS  . traZODone  150 mg Oral QHS  . valACYclovir  500 mg Oral BID   Continuous Infusions:  PRN Meds: acetaminophen **OR** acetaminophen, albuterol, HYDROcodone-acetaminophen, ondansetron **OR** ondansetron (ZOFRAN) IV, polyethylene glycol  Allergies:    Allergies  Allergen Reactions  . Codeine Other (See Comments)    "I will have a heart attack."  . Morphine And Related Other (See Comments)    "It will cause me to have a heart attack."  . Ambien [Zolpidem Tartrate] Nausea And Vomiting  . Lyrica [Pregabalin] Swelling and Other (See Comments)    Weight gain  . Neurontin [Gabapentin] Other (See Comments)    Causes elevated LFTs     Social History:   Social History   Socioeconomic History  . Marital status: Single    Spouse name: Not on file  . Number of children: 6  . Years of education: Not on file  . Highest education level: 6th grade  Occupational History  . Occupation: Disability    Comment: 15 years  Social Needs  . Financial resource strain: Somewhat hard  . Food insecurity    Worry: Sometimes true    Inability: Sometimes true  . Transportation needs    Medical: No    Non-medical: No  Tobacco Use  . Smoking status: Never Smoker  . Smokeless tobacco: Never Used  Substance and Sexual Activity  . Alcohol use: No    Alcohol/week: 0.0 standard drinks  . Drug use: No  . Sexual activity: Not Currently  Lifestyle  . Physical activity    Days per week: 0 days    Minutes per session: 0 min  . Stress: To some extent  Relationships  . Social connections    Talks on phone: More than three times a week    Gets together:  More than three times a week    Attends religious service: Never    Active member of club or organization: No    Attends meetings of clubs or organizations: Never    Relationship status: Patient refused  . Intimate partner violence    Fear of current or ex partner: No    Emotionally abused: No    Physically abused: No    Forced sexual activity: No  Other Topics Concern  . Not on file  Social History Narrative   Patient lives with Daughter   Patient is on disability.    Patient has 8th grade education.    Patient has 6 children, 24 grand-chilren, and 5 great-grand children.    Right handed     Family History:    Family History  Problem Relation Age of Onset  . Heart attack Father   . Cancer Father   . Mental illness Father   . Cancer Mother   . Mental illness Mother   . Heart attack Brother        stents  . Alcohol abuse Brother   . Heart disease Brother   . Drug abuse Brother   . Diabetes Brother   . Colon cancer Maternal Aunt   . Cirrhosis Brother   . Stomach cancer Neg Hx      ROS:  Please see the history of present illness.  All other ROS reviewed and negative.     Physical Exam/Data:   Vitals:   01/18/19 0600 01/18/19 0630 01/18/19 0700 01/18/19 0808  BP: (!) 89/51 91/63 (!) 104/59 133/80  Pulse: 61 63 66 70  Resp: 12 11 16 17   Temp:    98.8 F (37.1 C)  TempSrc:    Oral  SpO2: 93% 93% 93% 99%  Weight:      Height:       No intake or output data in the 24 hours ending 01/18/19 0925 Last 3 Weights 01/17/2019 12/16/2018 12/14/2018  Weight (lbs) 150 lb 156 lb 152 lb  Weight (kg) 68.04 kg 70.761 kg 68.947 kg     Body mass index is 27.44 kg/m.  General:  Well nourished, well developed, in no acute distress HEENT: normal Lymph: no adenopathy Neck: no JVD Endocrine:  No thryomegaly Vascular: No carotid bruits; FA pulses 2+ bilaterally without bruits  Cardiac:  normal S1, S2; RRR; no murmur Lungs:  clear to auscultation bilaterally, no wheezing,  rhonchi or rales  Abd: soft, nontender, no hepatomegaly  Ext: no edema Musculoskeletal:  No deformities, BUE and BLE strength normal and equal Skin: warm and dry  Neuro:  CNs 2-12 intact, no focal abnormalities noted Psych:  Normal affect     Laboratory Data:  High Sensitivity Troponin:   Recent Labs  Lab 01/17/19 1219 01/17/19 1401 01/17/19 2151 01/17/19 2339  TROPONINIHS 7 7 10 10      Chemistry Recent Labs  Lab 01/17/19 1219  NA 141  K 3.6  CL 103  CO2 28  GLUCOSE 113*  BUN 10  CREATININE 0.81  CALCIUM 9.1  GFRNONAA >60  GFRAA >60  ANIONGAP 10    No results for input(s): PROT, ALBUMIN, AST, ALT, ALKPHOS, BILITOT in the last 168 hours. Hematology Recent Labs  Lab 01/17/19 1219  WBC 7.5  RBC 4.82  HGB 11.8*  HCT 41.0  MCV 85.1  MCH 24.5*  MCHC 28.8*  RDW 15.9*  PLT 200   BNPNo results for input(s): BNP, PROBNP in the last 168 hours.  DDimer  Recent Labs  Lab 01/17/19 1219  DDIMER 1.01*     Radiology/Studies:  Ct Angio Chest Pe W And/or Wo Contrast  Result Date: 01/17/2019 CLINICAL DATA:  Short of breath. Elevated D-dimer. Concern for pulmonary embolism. EXAM: CT ANGIOGRAPHY CHEST WITH CONTRAST TECHNIQUE: Multidetector CT imaging of the chest was performed using the standard protocol during bolus administration of intravenous contrast. Multiplanar CT image reconstructions and MIPs were obtained to evaluate the  vascular anatomy. CONTRAST:  OMNIPAQUE IOHEXOL 350 MG/ML SOLN COMPARISON:  None. FINDINGS: Cardiovascular: No filling defects within the pulmonary arteries to suggest acute pulmonary embolism. No acute findings of the aorta or great vessels. No pericardial fluid. Mediastinum/Nodes: No axillary or supraclavicular adenopathy. No mediastinal hilar adenopathy. No pericardial effusion. Large hiatal hernia posterior the heart Lungs/Pleura: No pulmonary infarction. No pneumonia. There is linear bibasilar atelectasis. Small pulmonary nodule  measuring 4 mm in the RIGHT upper lobe (image 61/6). Several peripheral nodules in the RIGHT lower lobe and a branching pattern (image 71/6) suggests mild or resolving infectious process. No evidence acute pneumonia. Upper Abdomen: Limited view of the liver, kidneys, pancreas are unremarkable. Normal adrenal glands. Postcholecystectomy Musculoskeletal: No aggressive osseous lesion Review of the MIP images confirms the above findings. IMPRESSION: 1. No evidence acute pulmonary embolism. 2. Large hiatal hernia. 3. Several small peripheral nodules in the RIGHT lower lobe suggest mild or resolving infectious process. 4. Small nodule in the RIGHT upper lobe. No follow-up needed if patient is low-risk. Non-contrast chest CT can be considered in 12 months if patient is high-risk. This recommendation follows the consensus statement: Guidelines for Management of Incidental Pulmonary Nodules Detected on CT Images: From the Fleischner Society 2017; Radiology 2017; 284:228-243. Electronically Signed   By: Genevive Bi M.D.   On: 01/17/2019 17:01   Dg Chest Portable 1 View  Result Date: 01/17/2019 CLINICAL DATA:  Chest pain EXAM: PORTABLE CHEST 1 VIEW COMPARISON:  May 17, 2018 FINDINGS: No edema or consolidation. Heart is upper normal in size with pulmonary vascularity normal. No adenopathy. There is a focal hiatal hernia. There is a loop recorder on the left. No bone lesions. IMPRESSION: No edema or consolidation. Heart upper normal in size. Focal hiatal hernia. Loop recorder on left. Electronically Signed   By: Bretta Bang III M.D.   On: 01/17/2019 12:53    Assessment and Plan:   1. Chest pain - several year history of chest pain with negative ischemic workups in the past. Stress test 2013, 2016, 2019 were negative. Cath 2013 without significant disease - no objective evidence of ischemia by EKG or enzymes this admission - echo is pending  - current symptoms are noncardiac in description. Lasting  constantly x 6 days, worst with deep breathing. - f/u echo, if no significant changes no further cardiac workup. Will give diet, no plans for ischemic testing at this time - trial of toradol for possible MSK pain.  - f/u echo   2. Neurally mediated syncope - complex history, has been on florinef at home with coreg for severe HTN episodes. Imdur was started by another provider for chest pains, she has not been willing to change imdur as reports it helps her chest pain. She does have lasix on her med list but only takes prn, not taking regularly.  - she was seen by  Autonomic specialist at [facility], was to have autonomic testing but appears she did not complete.    3. HTN - has been difficult to manage due to her issues with recurrent syncope and labile bp's, in general have tolerated high bp's for her - recent high bp's as outpatient, she was to hold her florinef for a few days.   - change coreg to 6.25mg  bid. Remain off florinef for today, follow bp trends. May start back low dose florinef pending trends today.     For questions or updates, please contact [facility] Please consult [email or web address] for contact info under  Merrily Pew, MD  01/18/2019 9:25 AM

## 2019-01-18 NOTE — Plan of Care (Signed)

## 2019-01-18 NOTE — Progress Notes (Signed)
  Echocardiogram 2D Echocardiogram has been performed.  [name] 01/18/2019, 2:35 PM

## 2019-01-18 NOTE — Care Management Obs Status (Signed)
[place] NOTIFICATION   Patient Details  Name: [name] MRN: [id number] Date of Birth: 1955/12/04   Medicare Observation Status Notification Given:  Yes    Tommy Medal 01/18/2019, 2:35 PM

## 2019-01-18 NOTE — Progress Notes (Signed)
PROGRESS NOTE    [name]  YKD:[id number] DOB: 1955-09-04 DOA: 01/17/2019 PCP: Bennie Pierini, FNP     Brief Narrative:  As per H&P written by Dr. Mariea Clonts on 01/16/2019. 63 y.o. female with medical history significant for bipolar disorder, fibromyalgia, generalized anxiety disorders, seizures, hypertension, HSV.  Patient presented to the ED with complaints of persistent chest pain of 5 days duration.  Cannot remember what she was doing when the chest pain started.  Chest pain is worse with deep breathing.  No known relieving factors.  She describes pain as sharp, located on the left side of her chest, nonradiating.  She reports associated difficulty breathing, diaphoresis, and palpitations with chest pain.  She denies cough or shortness of breath otherwise.  Patient reports she had a heart attack when she was 63 years old but no stents were placed.  Never smoked cigarettes denies illicit drug use.  Patient reports she does not want to take nitro because it drops her blood pressure significantly.  She reports white blood pressure fluctuations from 180s to 50s systolic.  She has a loop recorder, she has not been able to follow-up with cardiology as she is afraid of getting infected with Covid. Patient called her cardiologist and was referred to the ED for evaluation.  ED Course: Systolic blood pressure 150s to 180s.  Hs  troponin x2 unremarkable.  EKG without significant changes from prior.  Unremarkable CBC BMP.  D-dimer elevated at 1, subsequent CTA chest negative for acute abnormality, but shows large hiatal hernia, several small peripheral nodules in the right lower lobe suggest mild or resolving infectious process.  With persistent chest pain hospitalist was called to admit for chest pain rule out ACS.  Assessment & Plan: 1-atypical chest pain CT angio demonstrated no pulmonary embolism -Patient is afebrile, no WBCs elevation no acute cardiopulmonary process on  x-ray -Troponins negative x4; no acute ischemic changes on EKG or telemetry -Radiology has been consulted and recommended adjustment on her carvedilol and repeat echocardiogram -will also use PRN toradol for assistance in chest pain management. -Complete 5 days of azithromycin for bronchitis -follow clinical response.  -continue PPI  2-patient with underlying history of asthma, bipolar disorder, fibromyalgia and general anxiety -Overall stable but potentially playing a role in patient ongoing chronic/extensive chest discomfort. -Continue trazodone nightly associated with sleeping disorder -Continue home Lexapro, duloxetine and Valium twice daily -no SI or hallucinations. -continue Breo-ellipta and PRN vicodin   3-history of seizure disorder -Last seizure event was a year ago -Continue Lamictal and Keppra.  4-hypertension -Patient with pending labile blood pressure, and with component of orthostatic changes. -will hold florinef for now -continue adjusted dose of metoprolol as recommended by cardiology   5-hx of HSV -continue vlacyclovir  6-GERD -continue PPI and carafate.  7-HLD -continue crestor   DVT prophylaxis: Lovenox Code Status: Full code Family Communication: No family at bedside. Disposition Plan: Continue monitoring on telemetry, beta-blocker dose adjusted, holding Florinef, follow-up results and clinical response.  Consultants:   Cardiology service  Procedures:   2D echo pending  Antimicrobials:  Anti-infectives (From admission, onward)   Start     Dose/Rate Route Frequency Ordered Stop   01/17/19 2200  valACYclovir (VALTREX) tablet 500 mg    Note to Pharmacy: TAKE (1) TABLET TWICE DAILY.     500 mg Oral 2 times daily 01/17/19 2131     01/17/19 2200  azithromycin (ZITHROMAX) tablet 500 mg     500 mg Oral Daily 01/17/19 2149  Subjective: Still complaining of chest discomfort, no nausea or vomiting.  Reports no shortness of breath or  PND.  Objective: Vitals:   01/18/19 0630 01/18/19 0700 01/18/19 0808 01/18/19 1300  BP: 91/63 (!) 104/59 133/80 123/74  Pulse: 63 66 70   Resp: 11 16 17    Temp:   98.8 F (37.1 C)   TempSrc:   Oral   SpO2: 93% 93% 99%   Weight:      Height:       No intake or output data in the 24 hours ending 01/18/19 1307 Filed Weights   01/17/19 1151  Weight: 68 kg    Examination: General exam: Alert, awake, oriented x 3; no nausea, no vomiting, no shortness of breath.  Patient reports ongoing chest discomfort. Respiratory system: Good air movement bilaterally.  Clear to auscultation. Respiratory effort normal. Cardiovascular system:RRR. No rubs or gallops.  No JVD on exam. Gastrointestinal system: Abdomen is nondistended, soft and nontender. No organomegaly or masses felt. Normal bowel sounds heard. Central nervous system: Alert and oriented. No focal neurological deficits. Extremities: No cyanosis, no clubbing.  Skin: No rashes, lesions or ulcers Psychiatry: Judgement and insight appear normal. Mood & affect appropriate.    Data Reviewed: I have personally reviewed following labs and imaging studies  CBC: Recent Labs  Lab 01/17/19 1219  WBC 7.5  HGB 11.8*  HCT 41.0  MCV 85.1  PLT 200   Basic Metabolic Panel: Recent Labs  Lab 01/17/19 1219  NA 141  K 3.6  CL 103  CO2 28  GLUCOSE 113*  BUN 10  CREATININE 0.81  CALCIUM 9.1   GFR: Estimated Creatinine Clearance: 65.1 mL/min (by C-G formula based on SCr of 0.81 mg/dL).  Urine analysis:    Component Value Date/Time   COLORURINE YELLOW 12/12/2016 1516   APPEARANCEUR Clear 12/16/2018 1626   LABSPEC 1.014 12/12/2016 1516   PHURINE 8.0 12/12/2016 1516   GLUCOSEU Negative 12/16/2018 1626   HGBUR NEGATIVE 12/12/2016 1516   BILIRUBINUR Negative 12/16/2018 1626   KETONESUR NEGATIVE 12/12/2016 1516   PROTEINUR 1+ (A) 12/16/2018 1626   PROTEINUR NEGATIVE 12/12/2016 1516   UROBILINOGEN negative 07/27/2013 1133    UROBILINOGEN 0.2 06/12/2008 1402   NITRITE Negative 12/16/2018 1626   NITRITE NEGATIVE 12/12/2016 1516   LEUKOCYTESUR 2+ (A) 12/16/2018 1626   Radiology Studies: Ct Angio Chest Pe W And/or Wo Contrast  Result Date: 01/17/2019 CLINICAL DATA:  Short of breath. Elevated D-dimer. Concern for pulmonary embolism. EXAM: CT ANGIOGRAPHY CHEST WITH CONTRAST TECHNIQUE: Multidetector CT imaging of the chest was performed using the standard protocol during bolus administration of intravenous contrast. Multiplanar CT image reconstructions and MIPs were obtained to evaluate the vascular anatomy. CONTRAST:  01/19/2019 OMNIPAQUE IOHEXOL 350 MG/ML SOLN COMPARISON:  None. FINDINGS: Cardiovascular: No filling defects within the pulmonary arteries to suggest acute pulmonary embolism. No acute findings of the aorta or great vessels. No pericardial fluid. Mediastinum/Nodes: No axillary or supraclavicular adenopathy. No mediastinal hilar adenopathy. No pericardial effusion. Large hiatal hernia posterior the heart Lungs/Pleura: No pulmonary infarction. No pneumonia. There is linear bibasilar atelectasis. Small pulmonary nodule measuring 4 mm in the RIGHT upper lobe (image 61/6). Several peripheral nodules in the RIGHT lower lobe and a branching pattern (image 71/6) suggests mild or resolving infectious process. No evidence acute pneumonia. Upper Abdomen: Limited view of the liver, kidneys, pancreas are unremarkable. Normal adrenal glands. Postcholecystectomy Musculoskeletal: No aggressive osseous lesion Review of the MIP images confirms the above findings. IMPRESSION: 1. No evidence  acute pulmonary embolism. 2. Large hiatal hernia. 3. Several small peripheral nodules in the RIGHT lower lobe suggest mild or resolving infectious process. 4. Small nodule in the RIGHT upper lobe. No follow-up needed if patient is low-risk. Non-contrast chest CT can be considered in 12 months if patient is high-risk. This recommendation follows the consensus  statement: Guidelines for Management of Incidental Pulmonary Nodules Detected on CT Images: From the Fleischner Society 2017; Radiology 2017; 284:228-243. Electronically Signed   By: Suzy Bouchard M.D.   On: 01/17/2019 17:01   Dg Chest Portable 1 View  Result Date: 01/17/2019 CLINICAL DATA:  Chest pain EXAM: PORTABLE CHEST 1 VIEW COMPARISON:  May 17, 2018 FINDINGS: No edema or consolidation. Heart is upper normal in size with pulmonary vascularity normal. No adenopathy. There is a focal hiatal hernia. There is a loop recorder on the left. No bone lesions. IMPRESSION: No edema or consolidation. Heart upper normal in size. Focal hiatal hernia. Loop recorder on left. Electronically Signed   By: Lowella Grip III M.D.   On: 01/17/2019 12:53   Scheduled Meds: . azithromycin  500 mg Oral Daily  . carvedilol  6.25 mg Oral BID  . diazepam  5 mg Oral BID  . DULoxetine  60 mg Oral QHS  . enoxaparin (LOVENOX) injection  40 mg Subcutaneous QHS  . escitalopram  20 mg Oral Daily  . fluticasone furoate-vilanterol  1 puff Inhalation Daily  . isosorbide mononitrate  30 mg Oral Daily  . lamoTRIgine  150 mg Oral Daily  . levETIRAcetam  500 mg Oral BID  . loratadine  10 mg Oral Daily  . oxybutynin  5 mg Oral BID  . pantoprazole  40 mg Oral Daily  . rosuvastatin  10 mg Oral Daily  . sucralfate  1 g Oral TID WC & HS  . traZODone  150 mg Oral QHS  . valACYclovir  500 mg Oral BID   Continuous Infusions:   LOS: 0 days    Time spent: 30 minutes.   Barton Dubois, MD [facility] Pager [phone]  If 7PM-7AM, please contact night-coverage [email or web address] Password [email or web address] 01/18/2019, 1:07 PM

## 2019-01-19 ENCOUNTER — Ambulatory Visit: Payer: 59

## 2019-01-19 DIAGNOSIS — R0789 Other chest pain: Secondary | ICD-10-CM | POA: Diagnosis not present

## 2019-01-19 DIAGNOSIS — R079 Chest pain, unspecified: Secondary | ICD-10-CM

## 2019-01-19 MED ORDER — FLUDROCORTISONE ACETATE 0.1 MG PO TABS
0.1000 mg | ORAL_TABLET | Freq: Every day | ORAL | Status: DC
  Administered 2019-01-19: 0.1 mg via ORAL
  Filled 2019-01-19: qty 1

## 2019-01-19 MED ORDER — FLUDROCORTISONE ACETATE 0.1 MG PO TABS: 0.1000 mg | ORAL_TABLET | Freq: Every day | ORAL | 1 refills | Status: DC

## 2019-01-19 MED ORDER — INFLUENZA VAC SPLIT QUAD 0.5 ML IM SUSY
0.5000 mL | PREFILLED_SYRINGE | INTRAMUSCULAR | Status: AC
  Administered 2019-01-19: 0.5 mL via INTRAMUSCULAR
  Filled 2019-01-19: qty 0.5

## 2019-01-19 MED ORDER — KETOROLAC TROMETHAMINE 30 MG/ML IJ SOLN
30.0000 mg | Freq: Once | INTRAMUSCULAR | Status: AC
  Administered 2019-01-19: 30 mg via INTRAVENOUS
  Filled 2019-01-19: qty 1

## 2019-01-19 MED ORDER — CARVEDILOL 6.25 MG PO TABS: 6.2500 mg | ORAL_TABLET | Freq: Two times a day (BID) | ORAL | 1 refills | Status: DC

## 2019-01-19 NOTE — Progress Notes (Signed)
Pt states that she has decided to take the flu vaccine here while in the hospital because she will not see her PMD as previously scheduled.

## 2019-01-19 NOTE — Plan of Care (Signed)

## 2019-01-19 NOTE — Progress Notes (Signed)
Progress Note  Patient Name: [name] Date of Encounter: 01/19/2019  Primary Cardiologist: Carlyle Dolly, MD   Subjective   Chest pain resolved this AM  Inpatient Medications    Scheduled Meds: . azithromycin  500 mg Oral Daily  . carvedilol  6.25 mg Oral BID  . diazepam  5 mg Oral BID  . DULoxetine  60 mg Oral QHS  . enoxaparin (LOVENOX) injection  40 mg Subcutaneous QHS  . escitalopram  20 mg Oral Daily  . fluticasone furoate-vilanterol  1 puff Inhalation Daily  . [START ON 01/20/2019] influenza vac split quadrivalent PF  0.5 mL Intramuscular Tomorrow-1000  . isosorbide mononitrate  30 mg Oral Daily  . lamoTRIgine  150 mg Oral Daily  . levETIRAcetam  500 mg Oral BID  . loratadine  10 mg Oral Daily  . oxybutynin  5 mg Oral BID  . pantoprazole  40 mg Oral Daily  . rosuvastatin  10 mg Oral Daily  . sucralfate  1 g Oral TID WC & HS  . traZODone  150 mg Oral QHS  . valACYclovir  500 mg Oral BID   Continuous Infusions:  PRN Meds: acetaminophen **OR** acetaminophen, albuterol, HYDROcodone-acetaminophen, ondansetron **OR** ondansetron (ZOFRAN) IV, polyethylene glycol   Vital Signs    Vitals:   01/18/19 1950 01/18/19 2022 01/18/19 2118 01/19/19 0527  BP:  (!) 148/81 (!) 142/92 (!) 141/79  Pulse:  70 66 70  Resp:   16 16  Temp:   98.6 F (37 C) 98.5 F (36.9 C)  TempSrc:   Oral Oral  SpO2: 95%  95% 93%  Weight:      Height:        Intake/Output Summary (Last 24 hours) at 01/19/2019 0852 Last data filed at 01/18/2019 1700 Gross per 24 hour  Intake 240 ml  Output -  Net 240 ml   Last 3 Weights 01/17/2019 12/16/2018 12/14/2018  Weight (lbs) 150 lb 156 lb 152 lb  Weight (kg) 68.04 kg 70.761 kg 68.947 kg      Telemetry    SR- Personally Reviewed  ECG    n/a - Personally Reviewed  Physical Exam   GEN: No acute distress.   Neck: No JVD Cardiac: RRR, no murmurs, rubs, or gallops.  Respiratory: Clear to auscultation bilaterally. GI: Soft,  nontender, non-distended  MS: No edema; No deformity. Neuro:  Nonfocal  Psych: Normal affect   Labs    High Sensitivity Troponin:   Recent Labs  Lab 01/17/19 1219 01/17/19 1401 01/17/19 2151 01/17/19 2339  TROPONINIHS 7 7 10 10       Chemistry Recent Labs  Lab 01/17/19 1219  NA 141  K 3.6  CL 103  CO2 28  GLUCOSE 113*  BUN 10  CREATININE 0.81  CALCIUM 9.1  GFRNONAA >60  GFRAA >60  ANIONGAP 10     Hematology Recent Labs  Lab 01/17/19 1219  WBC 7.5  RBC 4.82  HGB 11.8*  HCT 41.0  MCV 85.1  MCH 24.5*  MCHC 28.8*  RDW 15.9*  PLT 200    BNPNo results for input(s): BNP, PROBNP in the last 168 hours.   DDimer  Recent Labs  Lab 01/17/19 1219  DDIMER 1.01*     Radiology    Ct Angio Chest Pe W And/or Wo Contrast  Result Date: 01/17/2019 CLINICAL DATA:  Short of breath. Elevated D-dimer. Concern for pulmonary embolism. EXAM: CT ANGIOGRAPHY CHEST WITH CONTRAST TECHNIQUE: Multidetector CT imaging of the chest was performed using the  standard protocol during bolus administration of intravenous contrast. Multiplanar CT image reconstructions and MIPs were obtained to evaluate the vascular anatomy. CONTRAST:  OMNIPAQUE IOHEXOL 350 MG/ML SOLN COMPARISON:  None. FINDINGS: Cardiovascular: No filling defects within the pulmonary arteries to suggest acute pulmonary embolism. No acute findings of the aorta or great vessels. No pericardial fluid. Mediastinum/Nodes: No axillary or supraclavicular adenopathy. No mediastinal hilar adenopathy. No pericardial effusion. Large hiatal hernia posterior the heart Lungs/Pleura: No pulmonary infarction. No pneumonia. There is linear bibasilar atelectasis. Small pulmonary nodule measuring 4 mm in the RIGHT upper lobe (image 61/6). Several peripheral nodules in the RIGHT lower lobe and a branching pattern (image 71/6) suggests mild or resolving infectious process. No evidence acute pneumonia. Upper Abdomen: Limited view of the liver,  kidneys, pancreas are unremarkable. Normal adrenal glands. Postcholecystectomy Musculoskeletal: No aggressive osseous lesion Review of the MIP images confirms the above findings. IMPRESSION: 1. No evidence acute pulmonary embolism. 2. Large hiatal hernia. 3. Several small peripheral nodules in the RIGHT lower lobe suggest mild or resolving infectious process. 4. Small nodule in the RIGHT upper lobe. No follow-up needed if patient is low-risk. Non-contrast chest CT can be considered in 12 months if patient is high-risk. This recommendation follows the consensus statement: Guidelines for Management of Incidental Pulmonary Nodules Detected on CT Images: From the Fleischner Society 2017; Radiology 2017; 284:228-243. Electronically Signed   By: Genevive Bi M.D.   On: 01/17/2019 17:01   Dg Chest Portable 1 View  Result Date: 01/17/2019 CLINICAL DATA:  Chest pain EXAM: PORTABLE CHEST 1 VIEW COMPARISON:  May 17, 2018 FINDINGS: No edema or consolidation. Heart is upper normal in size with pulmonary vascularity normal. No adenopathy. There is a focal hiatal hernia. There is a loop recorder on the left. No bone lesions. IMPRESSION: No edema or consolidation. Heart upper normal in size. Focal hiatal hernia. Loop recorder on left. Electronically Signed   By: Bretta Bang III M.D.   On: 01/17/2019 12:53    Cardiac Studies     Patient Profile     [name] is a 63 y.o. female with a hx of who HTN, bipolar, COPD, neurally mediated syncope, chronic chest pain is being seen today for the evaluation of chest pain at the request of Dr Gwenlyn Perking.  Assessment & Plan    1. Chest pain - several year history of chest pain with negative ischemic workups in the past. Stress test 2013, 2016, 2019 were negative. Cath 2013 without significant disease - no objective evidence of ischemia by EKG or enzymes this admission - echo LVE 60-65%, grade I diastolic dysfunction  - current symptoms are noncardiac  in description. Lasting constantly x 6 days, worst with deep breathing. Much better with a dose of toradol yesterday.  No plans for repeat ischemic tesitng.    2. Neurally mediated syncope/Autonomic lability - complex history, has been on florinef at home with coreg for severe HTN episodes. Imdur was started by another provider for chest pains, she has not been willing to change imdur as reports it helps her chest pain. She does have lasix on her med list but only takes prn, not taking regularly.  - she was seen by  Autonomic specialist at [facility], was to have autonomic testing but appears she did not complete due covid risks    3. HTN - has been difficult to manage due to her issues with recurrent syncope and labile bp's, in general have tolerated higher bp's for her -  recent high bp's as outpatient, she was to hold her florinef for a few days.   - changed coreg to 6.25mg  bid. Remains off florinef - bp since yestery morning SBP 120s-140s - will restart her florinef but lower dose 0.1mg  daily.   4. Bronchitis - abx per primary team   [facility] for discharge from cardiac standpoint. Would send out on coreg 6.25mg  bid, florinef 0.1mg  daily. We will arrange outpatient f/u  For questions or updates, please contact [facility] Please consult [email or web address] for contact info under        Signed, Dina Rich, MD  01/19/2019, 8:52 AM

## 2019-01-19 NOTE — Discharge Summary (Signed)
Physician Discharge Summary  Lavetta NielsenRosemary B Milbrath ZOX:[id number] DOB: 06-15-1955 DOA: 01/17/2019  PCP: Bennie PieriniMartin, [name], FNP  Admit date: 01/17/2019 Discharge date: 01/19/2019  Admitted From: Home Disposition:  Home   Recommendations for Outpatient Follow-up:  1. Follow up with PCP in 1-2 weeks 2. Please obtain BMP/CBC in one week    Discharge Condition: Stable CODE STATUS: FULL Diet recommendation: Heart Healthy   Brief/Interim Summary: As per H&P written by Dr. Mariea ClontsEmokpae on 01/16/2019. 63 y.o.femalewith medical history significant forbipolar disorder, fibromyalgia, generalized anxiety disorders, seizures, hypertension, HSV.Patient presented to the ED with complaints of persistent chest pain of 5 days duration. Cannot remember what she was doing when the chest pain started. Chest pain is worse with deep breathing. No known relieving factors. She describes pain as sharp,located on the left side of her chest, nonradiating. She reports associated difficulty breathing, diaphoresis, and palpitations with chest pain. She denies cough or shortness of breath otherwise. Patient reports she had a heart attack when she was 63 years old but no stents were placed. Never smoked cigarettes denies illicit drug use.  Patient reports she does not want to take nitro because it drops her blood pressure significantly. She reports white blood pressure fluctuations from 180s to 50s systolic. She has a loop recorder, she has not been able to follow-up with cardiology as she is afraid of getting infected with Covid. Patient called her cardiologist and was referred to the ED for evaluation.  ED Course:Systolic blood pressure 150s to 180s.Hstroponin x2 unremarkable. EKG without significant changes from prior. Unremarkable CBC BMP. D-dimer elevated at 1, subsequent CTA chest negative for acute abnormality, but shows large hiatal hernia, several small peripheral nodules in the right lower  lobe suggest mild or resolving infectious process. With persistent chest pain hospitalist was called to admit for chest pain rule out ACS. Cardiology was consulted to assist.  Discharge Diagnoses:  -atypical chest pain CT angio demonstrated no pulmonary embolism -Patient is afebrile, no WBCs elevation no acute cardiopulmonary process on x-ray -Troponins negative x4; no acute ischemic changes on EKG or telemetry -Radiology has been consulted and recommended adjustment on her carvedilol and repeat echocardiogram -will also use PRN toradol for assistance in chest pain management. -appreciate cardiology consult -continue PPI current symptoms are noncardiac in description. Lasting constantly x 6 days, worst with deep breathing.  -Much better with a dose of toradol.  No plans for repeat ischemic tesitng.   asthma, bipolar disorder, fibromyalgia and general anxiety -Overall stable -Continue trazodone nightly associated with sleeping disorder -Continue home Lexapro, duloxetine and Valium twice daily -no SI or hallucinations. -continue Breo-ellipta and PRN vicodin   seizure disorder -Last seizure event was a year ago -Continue Lamictal and Keppra.  hypertension -Patient with pending labile blood pressure, and with component of orthostatic changes. -will hold florinef initially-->restarted prior to d/c -continue adjusted dose of metoprolol as recommended by cardiology   hx of HSV -continue vlacyclovir  GERD -continue PPI and carafate.  HLD -continue crestor   Discharge Instructions   Allergies as of 01/19/2019      Reactions   Codeine Other (See Comments)   "I will have a heart attack."   Morphine And Related Other (See Comments)   "It will cause me to have a heart attack."   Ambien [zolpidem Tartrate] Nausea And Vomiting   Lyrica [pregabalin] Swelling, Other (See Comments)   Weight gain   Neurontin [gabapentin] Other (See Comments)   Causes elevated LFTs       Medication  List    STOP taking these medications   azithromycin 250 MG tablet Commonly known as: Zithromax Z-Pak   butalbital-acetaminophen-caffeine 50-325-40 MG tablet Commonly known as: FIORICET   cephALEXin 500 MG capsule Commonly known as: KEFLEX   metroNIDAZOLE 500 MG tablet Commonly known as: Flagyl   nitroGLYCERIN 0.4 MG SL tablet Commonly known as: NITROSTAT   Trelegy Ellipta 100-62.5-25 MCG/INH Aepb Generic drug: Fluticasone-Umeclidin-Vilant     TAKE these medications   acyclovir ointment 5 % Commonly known as: ZOVIRAX APPLY TO AFFECTED AREA EVERY 3 HOURS What changed: See the new instructions.   albuterol (2.5 MG/3ML) 0.083% nebulizer solution Commonly known as: PROVENTIL USE 1 VIAL IN NEBULIZER EVERY 4 HOURS AS NEEDED FOR WHEEZING. What changed: See the new instructions.   albuterol 108 (90 Base) MCG/ACT inhaler Commonly known as: VENTOLIN HFA INHALE 2 PUFFS EVERY 6 HOURS AS NEEDED FOR SHORTNESS OF BREATH AND WHEEZING. What changed: See the new instructions.   Breo Ellipta 200-25 MCG/INH Aepb Generic drug: fluticasone furoate-vilanterol INHALE 1 PUFF DAILY. What changed: See the new instructions.   busPIRone 10 MG tablet Commonly known as: BUSPAR 1 po bid prn What changed:   how much to take  how to take this  when to take this  reasons to take this  additional instructions   carvedilol 6.25 MG tablet Commonly known as: COREG Take 1 tablet (6.25 mg total) by mouth 2 (two) times daily. What changed: See the new instructions.   Dexilant 60 MG capsule Generic drug: dexlansoprazole TAKE 1 CAPSULE BY MOUTH ONCE DAILY. What changed: how much to take   diazepam 5 MG tablet Commonly known as: VALIUM TAKE (1) TABLET TWICE DAILY. What changed: See the new instructions.   DULoxetine 60 MG capsule Commonly known as: CYMBALTA Take 1 capsule (60 mg total) by mouth at bedtime.   escitalopram 20 MG tablet Commonly known as: LEXAPRO TAKE (1)  TABLET BY MOUTH ONCE DAILY. What changed:   how much to take  how to take this  when to take this  additional instructions   estradiol 0.1 MG/GM vaginal cream Commonly known as: ESTRACE PLACE 1 APPLICATORFUL VAGINALLY AT BEDTIME.   fludrocortisone 0.1 MG tablet Commonly known as: FLORINEF Take 1 tablet (0.1 mg total) by mouth daily. What changed: how much to take   fluticasone 50 MCG/ACT nasal spray Commonly known as: FLONASE 2 SPRAYS INTO BOTH NOSTRILS DAILY. What changed: See the new instructions.   furosemide 40 MG tablet Commonly known as: LASIX Take 1 tablet (40 mg total) by mouth daily. What changed:   when to take this  reasons to take this   HYDROcodone-acetaminophen 7.5-325 MG tablet Commonly known as: NORCO Take 1 tablet by mouth 2 (two) times a day. Start taking on: February 10, 2019 What changed: Another medication with the same name was removed. Continue taking this medication, and follow the directions you see here.   ipratropium 0.02 % nebulizer solution Commonly known as: ATROVENT USE 1 VIAL IN NEBULIZER EVERY 4 HOURS AS NEEDED FOR WHEEZING. What changed: See the new instructions.   isosorbide mononitrate 30 MG 24 hr tablet Commonly known as: IMDUR TAKE 1 TABLET ONCE DAILY.   lamoTRIgine 150 MG tablet Commonly known as: LAMICTAL TAKE 1 TABLET BY MOUTH ONCE DAILY.   levETIRAcetam 500 MG tablet Commonly known as: KEPPRA TAKE (1) TABLET TWICE DAILY. What changed: See the new instructions.   levocetirizine 5 MG tablet Commonly known as: XYZAL TAKE 1 TABLET BY MOUTH EVERY MORNING.  What changed: when to take this   lidocaine 5 % ointment Commonly known as: XYLOCAINE Apply 1 application topically 3 (three) times daily as needed. (pain) What changed:   reasons to take this  additional instructions   ondansetron 4 MG tablet Commonly known as: ZOFRAN TAKE 1 TABLET BY MOUTH EVERY 8 HOURS AS NEEDED FOR NAUSEA AND VOMITING. What changed:  Another medication with the same name was removed. Continue taking this medication, and follow the directions you see here.   oxybutynin 5 MG tablet Commonly known as: DITROPAN Take 1 tablet (5 mg total) by mouth 2 (two) times daily.   rosuvastatin 10 MG tablet Commonly known as: CRESTOR TAKE (1) TABLET BY MOUTH ONCE DAILY. What changed:   how much to take  how to take this  when to take this  additional instructions   sucralfate 1 g tablet Commonly known as: CARAFATE TAKE 1 TABLET 4 TIMES DAILY - WITH MEALS AND AT BEDTIME What changed: See the new instructions.   tiZANidine 4 MG tablet Commonly known as: ZANAFLEX TAKE (1) TABLET EVERY SIX HOURS AS NEEDED FOR MUSCLE SPASMS. What changed: See the new instructions.   tolterodine 2 MG tablet Commonly known as: DETROL TAKE (1) TABLET TWICE DAILY.   traZODone 150 MG tablet Commonly known as: DESYREL Take 1 tablet (150 mg total) by mouth at bedtime.   valACYclovir 500 MG tablet Commonly known as: VALTREX TAKE (1) TABLET TWICE DAILY. What changed:   how much to take  how to take this  when to take this  additional instructions      Follow-up Information    Antoine PocheBranch, Jonathan F, MD Follow up on 02/04/2019.   Specialty: Cardiology Why: Keep scheduled VIRTUAL VISIT with Dr. Wyline MoodBranch on 02/04/2019 at 10:20AM.  Contact information: [address] [place] [id number] [phone]          Allergies  Allergen Reactions  . Codeine Other (See Comments)    "I will have a heart attack."  . Morphine And Related Other (See Comments)    "It will cause me to have a heart attack."  . Ambien [Zolpidem Tartrate] Nausea And Vomiting  . Lyrica [Pregabalin] Swelling and Other (See Comments)    Weight gain  . Neurontin [Gabapentin] Other (See Comments)    Causes elevated LFTs     Consultations:  cardiology   Procedures/Studies: Ct Angio Chest Pe W And/or Wo Contrast  Result Date: 01/17/2019 CLINICAL  DATA:  Short of breath. Elevated D-dimer. Concern for pulmonary embolism. EXAM: CT ANGIOGRAPHY CHEST WITH CONTRAST TECHNIQUE: Multidetector CT imaging of the chest was performed using the standard protocol during bolus administration of intravenous contrast. Multiplanar CT image reconstructions and MIPs were obtained to evaluate the vascular anatomy. CONTRAST:  100mL OMNIPAQUE IOHEXOL 350 MG/ML SOLN COMPARISON:  None. FINDINGS: Cardiovascular: No filling defects within the pulmonary arteries to suggest acute pulmonary embolism. No acute findings of the aorta or great vessels. No pericardial fluid. Mediastinum/Nodes: No axillary or supraclavicular adenopathy. No mediastinal hilar adenopathy. No pericardial effusion. Large hiatal hernia posterior the heart Lungs/Pleura: No pulmonary infarction. No pneumonia. There is linear bibasilar atelectasis. Small pulmonary nodule measuring 4 mm in the RIGHT upper lobe (image 61/6). Several peripheral nodules in the RIGHT lower lobe and a branching pattern (image 71/6) suggests mild or resolving infectious process. No evidence acute pneumonia. Upper Abdomen: Limited view of the liver, kidneys, pancreas are unremarkable. Normal adrenal glands. Postcholecystectomy Musculoskeletal: No aggressive osseous lesion Review of the  MIP images confirms the above findings. IMPRESSION: 1. No evidence acute pulmonary embolism. 2. Large hiatal hernia. 3. Several small peripheral nodules in the RIGHT lower lobe suggest mild or resolving infectious process. 4. Small nodule in the RIGHT upper lobe. No follow-up needed if patient is low-risk. Non-contrast chest CT can be considered in 12 months if patient is high-risk. This recommendation follows the consensus statement: Guidelines for Management of Incidental Pulmonary Nodules Detected on CT Images: From the Fleischner Society 2017; Radiology 2017; 284:228-243. Electronically Signed   By: Genevive Bi M.D.   On: 01/17/2019 17:01   Dg Chest  Portable 1 View  Result Date: 01/17/2019 CLINICAL DATA:  Chest pain EXAM: PORTABLE CHEST 1 VIEW COMPARISON:  May 17, 2018 FINDINGS: No edema or consolidation. Heart is upper normal in size with pulmonary vascularity normal. No adenopathy. There is a focal hiatal hernia. There is a loop recorder on the left. No bone lesions. IMPRESSION: No edema or consolidation. Heart upper normal in size. Focal hiatal hernia. Loop recorder on left. Electronically Signed   By: Bretta Bang III M.D.   On: 01/17/2019 12:53        Discharge Exam: Vitals:   01/19/19 0848 01/19/19 0909  BP: (!) 154/77   Pulse: 71   Resp: 16   Temp:    SpO2: 100% 97%   Vitals:   01/18/19 2118 01/19/19 0527 01/19/19 0848 01/19/19 0909  BP: (!) 142/92 (!) 141/79 (!) 154/77   Pulse: 66 70 71   Resp: 16 16 16    Temp: 98.6 F (37 C) 98.5 F (36.9 C)    TempSrc: Oral Oral    SpO2: 95% 93% 100% 97%  Weight:      Height:        General: Pt is alert, awake, not in acute distress Cardiovascular: RRR, S1/S2 +, no rubs, no gallops Respiratory: diminished BS but CTA bilaterally, no wheezing, no rhonchi Abdominal: Soft, NT, ND, bowel sounds + Extremities: no edema, no cyanosis   The results of significant diagnostics from this hospitalization (including imaging, microbiology, ancillary and laboratory) are listed below for reference.    Significant Diagnostic Studies: Ct Angio Chest Pe W And/or Wo Contrast  Result Date: 01/17/2019 CLINICAL DATA:  Short of breath. Elevated D-dimer. Concern for pulmonary embolism. EXAM: CT ANGIOGRAPHY CHEST WITH CONTRAST TECHNIQUE: Multidetector CT imaging of the chest was performed using the standard protocol during bolus administration of intravenous contrast. Multiplanar CT image reconstructions and MIPs were obtained to evaluate the vascular anatomy. CONTRAST:  01/19/2019 OMNIPAQUE IOHEXOL 350 MG/ML SOLN COMPARISON:  None. FINDINGS: Cardiovascular: No filling defects within the  pulmonary arteries to suggest acute pulmonary embolism. No acute findings of the aorta or great vessels. No pericardial fluid. Mediastinum/Nodes: No axillary or supraclavicular adenopathy. No mediastinal hilar adenopathy. No pericardial effusion. Large hiatal hernia posterior the heart Lungs/Pleura: No pulmonary infarction. No pneumonia. There is linear bibasilar atelectasis. Small pulmonary nodule measuring 4 mm in the RIGHT upper lobe (image 61/6). Several peripheral nodules in the RIGHT lower lobe and a branching pattern (image 71/6) suggests mild or resolving infectious process. No evidence acute pneumonia. Upper Abdomen: Limited view of the liver, kidneys, pancreas are unremarkable. Normal adrenal glands. Postcholecystectomy Musculoskeletal: No aggressive osseous lesion Review of the MIP images confirms the above findings. IMPRESSION: 1. No evidence acute pulmonary embolism. 2. Large hiatal hernia. 3. Several small peripheral nodules in the RIGHT lower lobe suggest mild or resolving infectious process. 4. Small nodule in the RIGHT upper lobe.  No follow-up needed if patient is low-risk. Non-contrast chest CT can be considered in 12 months if patient is high-risk. This recommendation follows the consensus statement: Guidelines for Management of Incidental Pulmonary Nodules Detected on CT Images: From the Fleischner Society 2017; Radiology 2017; 284:228-243. Electronically Signed   By: Genevive Bi M.D.   On: 01/17/2019 17:01   Dg Chest Portable 1 View  Result Date: 01/17/2019 CLINICAL DATA:  Chest pain EXAM: PORTABLE CHEST 1 VIEW COMPARISON:  May 17, 2018 FINDINGS: No edema or consolidation. Heart is upper normal in size with pulmonary vascularity normal. No adenopathy. There is a focal hiatal hernia. There is a loop recorder on the left. No bone lesions. IMPRESSION: No edema or consolidation. Heart upper normal in size. Focal hiatal hernia. Loop recorder on left. Electronically Signed   By: Bretta Bang III M.D.   On: 01/17/2019 12:53     Microbiology: Recent Results (from the past 240 hour(s))  SARS CORONAVIRUS 2 (Sunjai Levandoski 6-24 HRS) Nasopharyngeal Nasopharyngeal Swab     Status: None   Collection Time: 01/18/19  3:16 AM   Specimen: Nasopharyngeal Swab  Result Value Ref Range Status   SARS Coronavirus 2 NEGATIVE NEGATIVE Final    Comment: (NOTE) SARS-CoV-2 target nucleic acids are NOT DETECTED. The SARS-CoV-2 RNA is generally detectable in upper and lower respiratory specimens during the acute phase of infection. Negative results do not preclude SARS-CoV-2 infection, do not rule out co-infections with other pathogens, and should not be used as the sole basis for treatment or other patient management decisions. Negative results must be combined with clinical observations, patient history, and epidemiological information. The expected result is Negative. Fact Sheet for Patients: HairSlick.no Fact Sheet for Healthcare Providers: quierodirigir.com This test is not yet approved or cleared by the [place] FDA and  has been authorized for detection and/or diagnosis of SARS-CoV-2 by FDA under an Emergency Use Authorization (EUA). This EUA will remain  in effect (meaning this test can be used) for the duration of the COVID-19 declaration under Section 56 4(b)(1) of the Act, 21 U.S.C. section 360bbb-3(b)(1), unless the authorization is terminated or revoked sooner. Performed at [facility], [address], [place], [place] [address]      Labs: Basic Metabolic Panel: Recent Labs  Lab 01/17/19 1219  NA 141  K 3.6  CL 103  CO2 28  GLUCOSE 113*  BUN 10  CREATININE 0.81  CALCIUM 9.1   Liver Function Tests: No results for input(s): AST, ALT, ALKPHOS, BILITOT, PROT, ALBUMIN in the last 168 hours. No results for input(s): LIPASE, AMYLASE in the last 168 hours. No results for input(s): AMMONIA in the last 168  hours. CBC: Recent Labs  Lab 01/17/19 1219  WBC 7.5  HGB 11.8*  HCT 41.0  MCV 85.1  PLT 200   Cardiac Enzymes: No results for input(s): CKTOTAL, CKMB, CKMBINDEX, TROPONINI in the last 168 hours. BNP: Invalid input(s): POCBNP CBG: No results for input(s): GLUCAP in the last 168 hours.  Time coordinating discharge:  36 minutes  Signed:  Catarina Hartshorn, DO [facility] Pager: [phone] 01/19/2019, 10:59 AM

## 2019-02-02 ENCOUNTER — Other Ambulatory Visit: Payer: Self-pay | Admitting: Nurse Practitioner

## 2019-02-02 DIAGNOSIS — J309 Allergic rhinitis, unspecified: Secondary | ICD-10-CM

## 2019-02-02 DIAGNOSIS — K2100 Gastro-esophageal reflux disease with esophagitis, without bleeding: Secondary | ICD-10-CM

## 2019-02-02 DIAGNOSIS — N941 Unspecified dyspareunia: Secondary | ICD-10-CM

## 2019-02-02 DIAGNOSIS — M47812 Spondylosis without myelopathy or radiculopathy, cervical region: Secondary | ICD-10-CM

## 2019-02-02 DIAGNOSIS — R569 Unspecified convulsions: Secondary | ICD-10-CM

## 2019-02-03 ENCOUNTER — Other Ambulatory Visit: Payer: Self-pay | Admitting: *Deleted

## 2019-02-03 ENCOUNTER — Encounter: Payer: Self-pay | Admitting: Cardiology

## 2019-02-03 ENCOUNTER — Telehealth (INDEPENDENT_AMBULATORY_CARE_PROVIDER_SITE_OTHER): Payer: 59 | Admitting: Cardiology

## 2019-02-03 VITALS — BP 205/107 | HR 102 | Ht 62.0 in | Wt 143.0 lb

## 2019-02-03 DIAGNOSIS — R0789 Other chest pain: Secondary | ICD-10-CM | POA: Diagnosis not present

## 2019-02-03 DIAGNOSIS — G909 Disorder of the autonomic nervous system, unspecified: Secondary | ICD-10-CM

## 2019-02-03 DIAGNOSIS — I200 Unstable angina: Secondary | ICD-10-CM

## 2019-02-03 DIAGNOSIS — Z20822 Contact with and (suspected) exposure to covid-19: Secondary | ICD-10-CM

## 2019-02-03 MED ORDER — ISOSORBIDE MONONITRATE ER 30 MG PO TB24: 30.0000 mg | ORAL_TABLET | Freq: Every day | ORAL | 1 refills | Status: DC

## 2019-02-03 MED ORDER — FLUDROCORTISONE ACETATE 0.1 MG PO TABS: 0.1000 mg | ORAL_TABLET | Freq: Every day | ORAL | 1 refills | Status: DC

## 2019-02-03 MED ORDER — CARVEDILOL 6.25 MG PO TABS: 9.3750 mg | ORAL_TABLET | Freq: Two times a day (BID) | ORAL | 3 refills | Status: DC

## 2019-02-03 NOTE — Patient Instructions (Signed)
Your physician recommends that you schedule a follow-up appointment in: [name] has recommended you make the following change in your medication:   INCREASE CORED 9.375 (1 AND 1/2 TABLETS) TWICE DAILY   Thank you for choosing [facility]!!

## 2019-02-03 NOTE — Progress Notes (Signed)
Virtual Visit via Telephone Note   This visit type was conducted due to national recommendations for restrictions regarding the COVID-19 Pandemic (e.g. social distancing) in an effort to limit this patient's exposure and mitigate transmission in our community.  Due to her co-morbid illnesses, this patient is at least at moderate risk for complications without adequate follow up.  This format is felt to be most appropriate for this patient at this time.  The patient did not have access to video technology/had technical difficulties with video requiring transitioning to audio format only (telephone).  All issues noted in this document were discussed and addressed.  No physical exam could be performed with this format.  Please refer to the patient's chart for her  consent to telehealth for [facility].   Date:  02/03/2019   ID:  [name], DOB 1955/08/21, MRN [id number]  Patient Location: Home Provider Location: Office  PCP:  Bennie PieriniMartin, [name], FNP  Cardiologist:  Dina RichBranch, Latasia Silberstein, MD  Electrophysiologist:  None   Evaluation Performed:  Follow-Up Visit  Chief Complaint:  Follow up visit  History of Present Illness:    [name] is a 63 y.o. female seen today for follow up of the following medical problems.  1. Chest pain - several year history of chest pain - normal cath in 2013, normal stress test 2013 - [facility] with chest pain. Negative workup for ACS. She was discharged with an out patient Lexiscan which showed no evidence of ischemia.  - echo 11/2014 LVEF 55-60%, no WMAs.  -09/2017 nuclear stress no ischemia. - started on imdur by pcpwith improved symptoms.   12/2018 admission with chest pain - no objective evidence of ischemia by EKG or enzymes this admission - echo LVE 60-65%, grade I diastolic dysfunction -  symptoms were noncardiac in description. Lasting constantly x 6 days, worst with deep breathing.  - chest has been  improved,no significant issues since discharge.    2. 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 Issues getting arranged with transporation.    - we lowered florinef to 0.1mg  daily due to very high bp's. She is on coreg 6.25mg  bid scheduled   3. HTN - at rest SBPs 130s to 140s.  - with mild activities 205/107, HR 106.  - no recent lightheadedness or dizziness   4. COVID exposure - has pending test, she reports some mild symptoms thus far.      The patient does not have symptoms concerning for COVID-19 infection (fever, chills, cough, or new shortness of breath).    Past Medical History:  Diagnosis Date  . Anginal pain (HCC)    last time   . Anxiety   . Arthritis    RHEUMATOID  . Asthma   . Bipolar 1 disorder (HCC)   . Cataracts, bilateral 07/2017  . COPD (chronic obstructive pulmonary disease) (HCC)   . Coronary artery disease    reported hx of "MI";  Echo 2009 with normal LVF;  Myoview 05/2011: no ischemia  . Depression   . Dyslipidemia   . Dysrhythmia    SVT  . Esophageal stricture   . Fibromyalgia   . GERD (gastroesophageal reflux disease)   . H/O hiatal hernia   . Head injury, unspecified   .  Herpes simplex infection   . History of kidney stones   . Hyperlipidemia   . Hypertension   . Insomnia   . Myocardial infarction [facility])    age 22  . Osteoporosis   . Pneumonia    hx  . Seizures (HCC)    last 3 weeks ago- prescribed keppra -never took didnt like side effects read about online  . Shortness of breath   . Sleep apnea    ? neg  . Spinal stenosis of lumbar region   . Spondylolisthesis   . Status post placement of implantable loop recorder   . Supraventricular tachycardia (HCC)   .  Syncope and collapse    s/p ILR; no arhythmogenic cause identified  . UTI (lower urinary tract infection)    Past Surgical History:  Procedure Laterality Date  . BACK SURGERY    . BREAST SURGERY     lumpectomy  . CATARACT EXTRACTION W/PHACO Right 07/31/2017   Procedure: CATARACT EXTRACTION PHACO AND INTRAOCULAR LENS PLACEMENT (IOC);  Surgeon: Fabio Pierce, MD;  Location: [facility];  Service: Ophthalmology;  Laterality: Right;  CDE: 2.33  . CATARACT EXTRACTION W/PHACO Left 08/14/2017   Procedure: CATARACT EXTRACTION PHACO AND INTRAOCULAR LENS PLACEMENT (IOC);  Surgeon: Fabio Pierce, MD;  Location: [facility];  Service: Ophthalmology;  Laterality: Left;  CDE: 2.74  . CHOLECYSTECTOMY    . CYSTOSCOPY     stone  . DOPPLER ECHOCARDIOGRAPHY  2009  . head up tilt table testing  06/15/2007   [name]  . HEMORRHOID SURGERY    . insertion of implatable loop recorder  08/11/2007   [name]  . POSTERIOR CERVICAL FUSION/FORAMINOTOMY N/A 12/19/2013   Procedure: RIGHT C3-4.C4-5 AND C5-6 FORAMINOTOMIES;  Surgeon: Kerrin Champagne, MD;  Location[facility];  Service: Orthopedics;  Laterality: N/A;  . TUBAL LIGATION       Current Meds  Medication Sig  . acyclovir ointment (ZOVIRAX) 5 % APPLY TO AFFECTED AREA EVERY 3 HOURS (Patient taking differently: Apply 1 application topically daily as needed (to affected area(s)). )  . albuterol (PROVENTIL) (2.5 MG/3ML) 0.083% nebulizer solution USE 1 VIAL IN NEBULIZER EVERY 4 HOURS AS NEEDED FOR WHEEZING. (Patient taking differently: Take 2.5 mg by nebulization every 4 (four) hours as needed for wheezing. )  . albuterol (VENTOLIN HFA) 108 (90 Base) MCG/ACT inhaler INHALE 2 PUFFS EVERY 6 HOURS AS NEEDED FOR SHORTNESS OF BREATH AND WHEEZING. (Patient taking differently: Inhale 2 puffs into the lungs every 6 (six) hours as needed for wheezing or shortness of breath. )  . BREO ELLIPTA 200-25 MCG/INH AEPB INHALE 1 PUFF DAILY. (Patient taking differently: Inhale 1 puff into the  lungs daily. )  . busPIRone (BUSPAR) 10 MG tablet 1 po bid prn (Patient taking differently: Take 10 mg by mouth 2 (two) times daily as needed (for mood/ as directed). )  . Butalbital-Acetaminophen (BUTALBITAL-APAP PO) Take by mouth.  . carvedilol (COREG) 6.25 MG tablet Take 1 tablet (6.25 mg total) by mouth 2 (two) times daily.  [name] DEXILANT 60 MG capsule TAKE 1 CAPSULE BY MOUTH ONCE DAILY. (Patient taking differently: Take 60 mg by mouth daily. )  . diazepam (VALIUM) 5 MG tablet TAKE (1) TABLET TWICE DAILY. (Patient taking differently: Take 5 mg by mouth 2 (two) times daily. )  . DULoxetine (CYMBALTA) 60 MG capsule Take 1 capsule (60 mg total) by mouth at bedtime.  [name] escitalopram (LEXAPRO) 20 MG tablet TAKE (1) TABLET BY MOUTH ONCE DAILY. (Patient taking differently: Take  20 mg by mouth daily. )  . estradiol (ESTRACE) 0.1 MG/GM vaginal cream PLACE 1 APPLICATORFUL VAGINALLY AT BEDTIME.  . fludrocortisone (FLORINEF) 0.1 MG tablet Take 1 tablet (0.1 mg total) by mouth daily.  . fluticasone (FLONASE) 50 MCG/ACT nasal spray 2 SPRAYS INTO BOTH NOSTRILS DAILY. (Patient taking differently: Place 2 sprays into both nostrils daily. )  . furosemide (LASIX) 40 MG tablet Take 1 tablet (40 mg total) by mouth daily. (Patient taking differently: Take 40 mg by mouth daily as needed for fluid. )  . [START ON 02/10/2019] HYDROcodone-acetaminophen (NORCO) 7.5-325 MG tablet Take 1 tablet by mouth 2 (two) times a day.  . ipratropium (ATROVENT) 0.02 % nebulizer solution USE 1 VIAL IN NEBULIZER EVERY 4 HOURS AS NEEDED FOR WHEEZING. (Patient taking differently: Take 0.5 mg by nebulization every 4 (four) hours as needed for wheezing. )  . isosorbide mononitrate (IMDUR) 30 MG 24 hr tablet TAKE 1 TABLET ONCE DAILY. (Patient taking differently: Take 30 mg by mouth daily. )  . lamoTRIgine (LAMICTAL) 150 MG tablet TAKE 1 TABLET BY MOUTH ONCE DAILY. (Patient taking differently: Take 150 mg by mouth daily. )  . levETIRAcetam (KEPPRA)  500 MG tablet TAKE (1) TABLET TWICE DAILY. (Patient taking differently: Take 500 mg by mouth 2 (two) times daily. )  . levocetirizine (XYZAL) 5 MG tablet TAKE 1 TABLET BY MOUTH EVERY MORNING. (Patient taking differently: Take 5 mg by mouth daily. )  . lidocaine (XYLOCAINE) 5 % ointment Apply 1 application topically 3 (three) times daily as needed. (pain) (Patient taking differently: Apply 1 application topically 3 (three) times daily as needed for mild pain or moderate pain. )  . ondansetron (ZOFRAN) 4 MG tablet TAKE 1 TABLET BY MOUTH EVERY 8 HOURS AS NEEDED FOR NAUSEA AND VOMITING.  [name]. oxybutynin (DITROPAN) 5 MG tablet Take 1 tablet (5 mg total) by mouth 2 (two) times daily.  . rosuvastatin (CRESTOR) 10 MG tablet TAKE (1) TABLET BY MOUTH ONCE DAILY. (Patient taking differently: Take 10 mg by mouth daily. )  . sucralfate (CARAFATE) 1 g tablet TAKE 1 TABLET 4 TIMES DAILY - WITH MEALS AND AT BEDTIME (Patient taking differently: Take 1 g by mouth 4 (four) times daily -  with meals and at bedtime. )  . tiZANidine (ZANAFLEX) 4 MG tablet TAKE (1) TABLET EVERY SIX HOURS AS NEEDED FOR MUSCLE SPASMS. (Patient taking differently: Take 4 mg by mouth every 6 (six) hours as needed for muscle spasms. )  . tolterodine (DETROL) 2 MG tablet TAKE (1) TABLET TWICE DAILY.  . traZODone (DESYREL) 150 MG tablet Take 1 tablet (150 mg total) by mouth at bedtime.  . valACYclovir (VALTREX) 500 MG tablet TAKE (1) TABLET TWICE DAILY. (Patient taking differently: Take 500 mg by mouth 2 (two) times daily. )     Allergies:   Codeine, Morphine and related, Ambien [zolpidem tartrate], Lyrica [pregabalin], and Neurontin [gabapentin]   Social History   Tobacco Use  . Smoking status: Never Smoker  . Smokeless tobacco: Never Used  Substance Use Topics  . Alcohol use: No    Alcohol/week: 0.0 standard drinks  . Drug use: No     Family Hx: The patient's family history includes Alcohol abuse in her brother; Cancer in her father and  mother; Cirrhosis in her brother; Colon cancer in her maternal aunt; Diabetes in her brother; Drug abuse in her brother; Heart attack in her brother and father; Heart disease in her brother; Mental illness in her father and mother. There  is no history of Stomach cancer.  ROS:   Please see the history of present illness.     All other systems reviewed and are negative.   Prior CV studies:   The following studies were reviewed today:  05/2011 Lexiscan MPI No ischemia   Jan 2009 Echo LEFT VENTRICLE: - Left ventricular size was normal. - Overall left ventricular systolic function was normal. - There were no left ventricular regional wall motion abnormalities. - Left ventricular wall thickness was normal.  AORTIC VALVE: - The aortic valve was trileaflet. - Aortic valve thickness was normal.  AORTA: - The aortic root was normal in size. - The aortic arch was normal.  MITRAL VALVE: - Mitral valve structure was normal.  Doppler interpretation(s): - There was trivial mitral valvular regurgitation.  LEFT ATRIUM: - Left atrial size was normal.  PULMONARY VEINS: - The pulmonary veins were grossly normal.  RIGHT VENTRICLE: - Right ventricular size was normal. - Right ventricular systolic function was normal. - Right ventricular wall thickness was normal.  PULMONIC VALVE: - The structure of the pulmonic valve appeared to be normal.  TRICUSPID VALVE: - The tricuspid valve structure was normal.  Doppler interpretation(s): - There was no significant tricuspid valvular regurgitation.  PULMONARY ARTERY: - The pulmonary artery was normal size.  RIGHT ATRIUM: - Right atrial size was normal.  SYSTEMIC VEINS: - The inferior vena cava was normal.  PERICARDIUM: - There was no pericardial effusion.  ---------------------------------------------------------------  SUMMARY - Overall left ventricular systolic function was normal. There were no left ventricular regional  wall motion abnormalities. - The pulmonary veins were grossly normal.   09/2011 Cath Hemodynamic Findings: Central aortic pressure: 108/58  Left ventricular pressure: 107/10/14  Angiographic Findings:  Left main: No obstructive disease noted.  Left Anterior Descending Artery: Moderate to large sized vessel that courses to the apex. There is a moderate sized diagonal Dalyah Pla. No obstructive disease noted.  Circumflex Artery: Large, dominant artery with moderate sized first obtuse marginal Issabelle Mcraney and left sided posterolateral Adryan Druckenmiller with no disease noted.  Right Coronary Artery: Small, non-dominant vessel with no disease noted.  Left Ventricular Angiogram:LVEF=65%.  Impression:  1. No angiographic evidence of CAD  2. Normal LV systolic function  3. Non-cardiac chest pain  Recommendations: No further ischemic workup.  Complications: None. The patient tolerated the procedure well.   11/2014 echo Study Conclusions  - Left ventricle: The cavity size was normal. Systolic function was normal. The estimated ejection fraction was in the range of 55% to 60%. Wall motion was normal; there were no regional wall motion abnormalities. There was an increased relative contribution of atrial contraction to ventricular filling. Doppler parameters are consistent with abnormal left ventricular relaxation (grade 1 diastolic dysfunction). - Mitral valve: There was trivial regurgitation. - Tricuspid valve: There was trivial regurgitation. - Pulmonic valve: There was trivial regurgitation.   09/2017 nuclear stress  There was no ST segment deviation noted during stress. Nonspecific T wave flattening in aVL and V2 seen throughout study.  Defect 1: There is a medium defect of mild severity present in the mid inferior, apical inferior and apical lateral location. This appears to be due to soft tissue attenuation given normal regional wall motion. No ischemic territories.   This is a low risk study.  Nuclear stress EF: 65%.   Labs/Other Tests and Data Reviewed:    EKG:  No ECG reviewed.  Recent Labs: 06/15/2018: TSH 2.560 10/26/2018: ALT 8 01/17/2019: BUN 10; Creatinine, Ser 0.81; Hemoglobin 11.8; Platelets  200; Potassium 3.6; Sodium 141   Recent Lipid Panel Lab Results  Component Value Date/Time   CHOL 169 10/26/2018 02:48 PM   TRIG 151 (H) 10/26/2018 02:48 PM   TRIG 233 (H) 07/24/2014 10:40 AM   HDL 50 10/26/2018 02:48 PM   HDL 47 07/24/2014 10:40 AM   CHOLHDL 3.4 10/26/2018 02:48 PM   CHOLHDL 4.2 CALC 04/22/2007 10:12 AM   LDLCALC 89 10/26/2018 02:48 PM   LDLDIRECT 142.1 04/22/2007 10:12 AM    Wt Readings from Last 3 Encounters:  02/03/19 143 lb (64.9 kg)  01/17/19 150 lb (68 kg)  12/16/18 156 lb (70.8 kg)     Objective:    Vital Signs:  BP (!) 205/107   Pulse (!) 102   Ht 5\' 2"  (1.575 m)   Wt 143 lb (64.9 kg)   BMI 26.16 kg/m    Normal affect. Normal speech pattern and tone, comfortable no apparent distress. No audible signs of SOB or wheezing.   ASSESSMENT & PLAN:    1. Chest pain - long history of symptoms with multiple negative stress tests, most recently7/2019Lexiscan was negative. Cath 2013 with patent vessels -- symptoms improved with imdur  - recent admit with atypical chest pain with negative workup, symptoms have resolved - continue to monitor.   2. Syncope/Autonomic insufficiency -difficult balance between severe symptomatic both hypotension and HTN. Currently on florinef 0.1mg  daily and coreg 6.25mg  bid. Resting bp's are good, significantly elevated bps with mild exertion - try increasing coreg to 9.375mg  bid     COVID-19 Education: The signs and symptoms of COVID-19 were discussed with the patient and how to seek care for testing (follow up with PCP or arrange E-visit).  The importance of social distancing was discussed today.  Time:   Today, I have spent 15 minutes with the patient with telehealth  technology discussing the above problems.     Medication Adjustments/Labs and Tests Ordered: Current medicines are reviewed at length with the patient today.  Concerns regarding medicines are outlined above.   Tests Ordered: No orders of the defined types were placed in this encounter.   Medication Changes: No orders of the defined types were placed in this encounter.   Follow Up:  Virtual Visit  in 6 week(s)  Signed, Dina Rich, MD  02/03/2019 11:02 AM    [facility]

## 2019-02-03 NOTE — Addendum Note (Signed)
Addended by: Julian Hy T on: 02/03/2019 11:23 AM   Modules accepted: Orders

## 2019-02-04 ENCOUNTER — Telehealth: Payer: 59 | Admitting: Cardiology

## 2019-02-05 LAB — NOVEL CORONAVIRUS, NAA: SARS-CoV-2, NAA: NOT DETECTED

## 2019-02-07 ENCOUNTER — Ambulatory Visit: Payer: 59 | Admitting: Nurse Practitioner

## 2019-02-08 ENCOUNTER — Ambulatory Visit (INDEPENDENT_AMBULATORY_CARE_PROVIDER_SITE_OTHER): Payer: 59 | Admitting: Nurse Practitioner

## 2019-02-08 ENCOUNTER — Encounter: Payer: Self-pay | Admitting: Nurse Practitioner

## 2019-02-08 ENCOUNTER — Telehealth: Payer: Self-pay | Admitting: Physical Medicine and Rehabilitation

## 2019-02-08 DIAGNOSIS — M47812 Spondylosis without myelopathy or radiculopathy, cervical region: Secondary | ICD-10-CM | POA: Diagnosis not present

## 2019-02-08 DIAGNOSIS — G44229 Chronic tension-type headache, not intractable: Secondary | ICD-10-CM

## 2019-02-08 MED ORDER — BUTALBITAL-APAP-CAFFEINE 50-325-40 MG PO TABS: 1.0000 | ORAL_TABLET | Freq: Four times a day (QID) | ORAL | 0 refills | Status: DC | PRN

## 2019-02-08 MED ORDER — DIAZEPAM 5 MG PO TABS: 5.0000 mg | ORAL_TABLET | Freq: Two times a day (BID) | ORAL | 0 refills | Status: DC

## 2019-02-08 NOTE — Telephone Encounter (Signed)
Patient called again before I could reach out to her to schedule. I returned her calls, but there was no answer and no option to leave a message.

## 2019-02-08 NOTE — Telephone Encounter (Signed)
I scheduled the patient for 11/30 at [place]. She is scheduled to see Dr. Junius Roads on 11/13 and she will discuss with him.

## 2019-02-08 NOTE — Telephone Encounter (Signed)
ok 

## 2019-02-08 NOTE — Progress Notes (Signed)
   Virtual Visit via telephone Note Due to COVID-19 pandemic this visit was conducted virtually. This visit type was conducted due to national recommendations for restrictions regarding the COVID-19 Pandemic (e.g. social distancing, sheltering in place) in an effort to limit this patient's exposure and mitigate transmission in our community. All issues noted in this document were discussed and addressed.  A physical exam was not performed with this format.  I connected with [name] on 02/08/19 at 10:40 by telephone and verified that I am speaking with the correct person using two identifiers. [name] is currently located at home and no one is currently with her during visit. The provider, [name] Hassell Done, FNP is located in their office at time of visit.  I discussed the limitations, risks, security and privacy concerns of performing an evaluation and management service by telephone and the availability of in person appointments. I also discussed with the patient that there may be a patient responsible charge related to this service. The patient expressed understanding and agreed to proceed.   History and Present Illness:  patient was suppose to come in for follow up but was exposed to Covid but lab results were negative. She has refill on her pain meds but needs refill on valium. She needs to be seen for follow up.  Review of Systems  Constitutional: Negative for diaphoresis and weight loss.  Eyes: Negative for blurred vision, double vision and pain.  Respiratory: Negative for shortness of breath.   Cardiovascular: Negative for chest pain, palpitations, orthopnea and leg swelling.  Gastrointestinal: Negative for abdominal pain.  Musculoskeletal: Positive for back pain and joint pain.  Skin: Negative for rash.  Neurological: Negative for dizziness, sensory change, loss of consciousness, weakness and headaches.  Endo/Heme/Allergies: Negative for polydipsia. Does not  bruise/bleed easily.  Psychiatric/Behavioral: Negative for memory loss. The patient does not have insomnia.   All other systems reviewed and are negative.    Observations/Objective: Alert and oriented- answers all questions appropriately No distress    Assessment and Plan: [name] in today with chief complaint of URI   1. Cervical spondylosis without myelopathy - diazepam (VALIUM) 5 MG tablet; Take 1 tablet (5 mg total) by mouth 2 (two) times daily.  Dispense: 60 tablet; Refill: 0  2. Chronic tension-type headache, not intractable - butalbital-acetaminophen-caffeine (FIORICET) 50-325-40 MG tablet; Take 1 tablet by mouth every 6 (six) hours as needed for headache.  Dispense: 20 tablet; Refill: 0   Follow Up Instructions: 1 month    I discussed the assessment and treatment plan with the patient. The patient was provided an opportunity to ask questions and all were answered. The patient agreed with the plan and demonstrated an understanding of the instructions.   The patient was advised to call back or seek an in-person evaluation if the symptoms worsen or if the condition fails to improve as anticipated.  The above assessment and management plan was discussed with the patient. The patient verbalized understanding of and has agreed to the management plan. Patient is aware to call the clinic if symptoms persist or worsen. Patient is aware when to return to the clinic for a follow-up visit. Patient educated on when it is appropriate to go to the emergency department.   Time call ended:  10:59  I provided 19 minutes of non-face-to-face time during this encounter.    [name] Hassell Done, FNP

## 2019-02-11 ENCOUNTER — Other Ambulatory Visit: Payer: Self-pay

## 2019-02-11 ENCOUNTER — Encounter: Payer: Self-pay | Admitting: Family Medicine

## 2019-02-11 ENCOUNTER — Ambulatory Visit (INDEPENDENT_AMBULATORY_CARE_PROVIDER_SITE_OTHER): Payer: 59 | Admitting: Family Medicine

## 2019-02-11 DIAGNOSIS — M5442 Lumbago with sciatica, left side: Secondary | ICD-10-CM

## 2019-02-11 DIAGNOSIS — M5441 Lumbago with sciatica, right side: Secondary | ICD-10-CM

## 2019-02-11 MED ORDER — PREDNISONE 10 MG PO TABS: ORAL_TABLET | ORAL | 0 refills | Status: DC

## 2019-02-11 NOTE — Progress Notes (Signed)
Office Visit Note   Patient: [name]           Date of Birth: 07-28-1955           MRN: [id number] Visit Date: 02/11/2019 Requested by: Bennie Pierini, FNP [address] [place],  [place] [address] PCP: Bennie Pierini, FNP  Subjective: Chief Complaint  Patient presents with  . Lower Back - Pain    Flareup of sciatica bilaterally, x 5 days. Hurts all the time. Worse on the left side than the right.    HPI: She is here with low back and left greater than right posterior hip pain.  Symptoms started about 4 or 5 days ago with no injury.  She is status post lumbar fusion from L3-L5.  Apparently she needed intervention at L1-2 as well, but shows not to do it at that time.  Her current pain radiates into the buttocks and around to the lateral hip.  She is not having any pain down the leg.  It is most comfortable to stand, worse to sit.  No bowel or bladder dysfunction.              ROS: No fevers or chills.  All other systems were reviewed and are negative.  Objective: Vital Signs: There were no vitals taken for this visit.  Physical Exam:  General:  Alert and oriented, in no acute distress. Pulm:  Breathing unlabored. Psy:  Normal mood, congruent affect. Skin: No rash visible. Low back: She is tender over the sacrum and in both sciatic notch areas.  Moderate tenderness over the left greater trochanter but no pain but internal hip rotation.  Straight leg raise is equivocal, lower extremity strength and reflexes are still normal.  Imaging: None today.  Assessment & Plan: 1.  Flareup of low back pain with bilateral posterior hip pain, possibly from upper lumbar disc pathology. -Intramuscular steroid injection today.  Prednisone Dosepak if symptoms still persist.  She has an appointment later this month with Dr. Alvester Morin for possible epidural injections and she will keep that.     Procedures: No procedures performed  No notes on file     PMFS History:  Patient Active Problem List   Diagnosis Date Noted  . Chest pain 01/17/2019  . Unstable angina (HCC) 10/20/2017  . Encounter for chronic pain management 09/23/2016  . Pre-diabetes 09/02/2016  . Recurrent major depressive disorder, in partial remission (HCC) 07/14/2016  . Seizures (HCC) 07/14/2016  . GAD (generalized anxiety disorder) 07/14/2016  . Insomnia 09/05/2014  . Allergic rhinitis 09/05/2014  . Cervical spondylosis without myelopathy 12/19/2013    Class: Chronic  . Neural foraminal stenosis of cervical spine 12/19/2013  . Cervical radiculitis 09/19/2013  . Lumbosacral spondylosis without myelopathy 11/16/2012  . Postlaminectomy syndrome, lumbar region 11/16/2012  . Herpes simplex virus (HSV) infection 10/31/2008  . Hyperlipidemia with target LDL less than 100 10/31/2008  . Essential hypertension 10/31/2008  . GERD 10/31/2008  . SPINAL STENOSIS OF LUMBAR REGION 10/31/2008  . Myalgia 10/31/2008  . Osteoporosis 10/31/2008  . SPONDYLOLISTHESIS 10/31/2008  . SYNCOPE 10/31/2008  . Sleep apnea 10/31/2008   Past Medical History:  Diagnosis Date  . Anginal pain (HCC)    last time   . Anxiety   . Arthritis    RHEUMATOID  . Asthma   . Bipolar 1 disorder (HCC)   . Cataracts, bilateral 07/2017  . COPD (chronic obstructive pulmonary disease) (HCC)   . Coronary artery disease    reported hx  of "MI";  Echo 2009 with normal LVF;  Myoview 05/2011: no ischemia  . Depression   . Dyslipidemia   . Dysrhythmia    SVT  . Esophageal stricture   . Fibromyalgia   . GERD (gastroesophageal reflux disease)   . H/O hiatal hernia   . Head injury, unspecified   . Herpes simplex infection   . History of kidney stones   . Hyperlipidemia   . Hypertension   . Insomnia   . Myocardial infarction [facility])    age 63  . Osteoporosis   . Pneumonia    hx  . Seizures ([place])    last 3 weeks ago- prescribed keppra -never took didnt like side effects read about online  . Shortness of breath   .  Sleep apnea    ? neg  . Spinal stenosis of lumbar region   . Spondylolisthesis   . Status post placement of implantable loop recorder   . Supraventricular tachycardia ([place])   . Syncope and collapse    s/p ILR; no arhythmogenic cause identified  . UTI (lower urinary tract infection)     Family History  Problem Relation Age of Onset  . Heart attack Father   . Cancer Father   . Mental illness Father   . Cancer Mother   . Mental illness Mother   . Heart attack Brother        stents  . Alcohol abuse Brother   . Heart disease Brother   . Drug abuse Brother   . Diabetes Brother   . Colon cancer Maternal Aunt   . Cirrhosis Brother   . Stomach cancer Neg Hx     Past Surgical History:  Procedure Laterality Date  . BACK SURGERY    . BREAST SURGERY     lumpectomy  . CATARACT EXTRACTION W/PHACO Right 07/31/2017   Procedure: CATARACT EXTRACTION PHACO AND INTRAOCULAR LENS PLACEMENT (IOC);  Surgeon: [name], MD;  Location: [facility];  Service: Ophthalmology;  Laterality: Right;  CDE: 2.33  . CATARACT EXTRACTION W/PHACO Left 08/14/2017   Procedure: CATARACT EXTRACTION PHACO AND INTRAOCULAR LENS PLACEMENT (IOC);  Surgeon: [name], MD;  Location: [facility];  Service: Ophthalmology;  Laterality: Left;  CDE: 2.74  . CHOLECYSTECTOMY    . CYSTOSCOPY     stone  . DOPPLER ECHOCARDIOGRAPHY  2009  . head up tilt table testing  06/15/2007   [name]  . HEMORRHOID SURGERY    . insertion of implatable loop recorder  08/11/2007   [name]  . POSTERIOR CERVICAL FUSION/FORAMINOTOMY N/A 12/19/2013   Procedure: RIGHT C3-4.C4-5 AND C5-6 FORAMINOTOMIES;  Surgeon: [name], MD;  Location: [facility];  Service: Orthopedics;  Laterality: N/A;  . TUBAL LIGATION     Social History   Occupational History  . Occupation: Disability    Comment: 15 years  Tobacco Use  . Smoking status: Never Smoker  . Smokeless tobacco: Never Used  Substance and Sexual Activity  . Alcohol use: No     Alcohol/week: 0.0 standard drinks  . Drug use: No  . Sexual activity: Not Currently

## 2019-02-11 NOTE — Progress Notes (Signed)
Generic Decadron 4 mg (1cc) given IM left dorsogluteal region.  [id number] Lot [id number] Exp 03/21 Generic Depo Medrol 40 mg (1cc) given IM right dorsogluteal region. [place] 5885-0277-41 Lot [id number] B Exp 08/21

## 2019-02-25 ENCOUNTER — Ambulatory Visit: Payer: Self-pay | Admitting: Nurse Practitioner

## 2019-02-28 ENCOUNTER — Other Ambulatory Visit: Payer: Self-pay

## 2019-02-28 ENCOUNTER — Ambulatory Visit: Payer: Self-pay

## 2019-02-28 ENCOUNTER — Encounter: Payer: Self-pay | Admitting: Physical Medicine and Rehabilitation

## 2019-02-28 ENCOUNTER — Ambulatory Visit (INDEPENDENT_AMBULATORY_CARE_PROVIDER_SITE_OTHER): Payer: 59 | Admitting: Physical Medicine and Rehabilitation

## 2019-02-28 DIAGNOSIS — M7061 Trochanteric bursitis, right hip: Secondary | ICD-10-CM | POA: Diagnosis not present

## 2019-02-28 DIAGNOSIS — M5416 Radiculopathy, lumbar region: Secondary | ICD-10-CM | POA: Diagnosis not present

## 2019-02-28 DIAGNOSIS — M7062 Trochanteric bursitis, left hip: Secondary | ICD-10-CM | POA: Diagnosis not present

## 2019-02-28 DIAGNOSIS — M961 Postlaminectomy syndrome, not elsewhere classified: Secondary | ICD-10-CM

## 2019-02-28 MED ORDER — BETAMETHASONE SOD PHOS & ACET 6 (3-3) MG/ML IJ SUSP
12.0000 mg | Freq: Once | INTRAMUSCULAR | Status: AC
  Administered 2019-02-28: 12 mg

## 2019-02-28 NOTE — Progress Notes (Signed)
Numeric Pain Rating Scale and Functional Assessment Average Pain 10   In the last MONTH (on 0-10 scale) has pain interfered with the following?  1. General activity like being  able to carry out your everyday physical activities such as walking, climbing stairs, carrying groceries, or moving a chair?  Rating(10)   +Driver, -BT, -Dye Allergies. 

## 2019-02-28 NOTE — Progress Notes (Signed)
Erica NielsenRosemary B [name] - 63 y.o. female MRN [id number]  Date of birth: 1955/05/07  Office Visit Note: Visit Date: 02/28/2019 PCP: Bennie PieriniMartin, [name], FNP Referred by: Daphine DeutscherMartin, [name], *  Subjective: Chief Complaint  Patient presents with  . Left Hip - Pain  . Right Hip - Pain   HPI: [name] is a 63 y.o. female who comes in today For evaluation management and possible interventional spine procedure for low back pain radiating to both buttock regions and down the back of the leg left more than right.  She has a history of lumbar fusion at L4-5.  MRI from 2019 with mild stenosis above the fusion no other issues although somewhat limited study performed at that time.  Patient also has significant anxiety likely with history of fibromyalgia.  We have been seeing her off and on now for a couple of years for trochanteric bursa injections with good relief.  The symptoms are different than that.  She recently saw Dr. Prince RomeHilts for evaluation for acute low back pain.  This is been ongoing now for several weeks to months.  She has had no specific injury.  Pain is worse with sitting.  She is in a wheelchair today.  She has not noted any focal weakness but her legs do feel tingly.  She has not had follow-up with Dr. Otelia SergeantNitka recently.  She has allergy to most pain medications.  She does have some anxiety about injections but I do think today with her case is probably likely that we need to do epidural injection at S1 bilaterally.  For L5-S1.  Symptoms seem to more consistent with disc.  If she does not get relief would look at probably MRI update.  She is having some mild pain over the greater trochanters but not as exacerbated does have seen her in the past.  She received intramuscular cortisone injection by Dr. Prince RomeHilts which did not help.  She rates her pain as a 10 out of 10.  Review of Systems  Constitutional: Negative for chills, fever, malaise/fatigue and weight loss.  HENT: Negative for hearing  loss and sinus pain.   Eyes: Negative for blurred vision, double vision and photophobia.  Respiratory: Negative for cough and shortness of breath.   Cardiovascular: Negative for chest pain, palpitations and leg swelling.  Gastrointestinal: Negative for abdominal pain, nausea and vomiting.  Genitourinary: Negative for flank pain.  Musculoskeletal: Positive for back pain. Negative for myalgias.  Skin: Negative for itching and rash.  Neurological: Positive for tingling. Negative for tremors, focal weakness and weakness.  Endo/Heme/Allergies: Negative.   Psychiatric/Behavioral: Negative for depression.  All other systems reviewed and are negative.  Otherwise per HPI.  Assessment & Plan: Visit Diagnoses:  1. Greater trochanteric bursitis, left   2. Lumbar radiculopathy   3. Post laminectomy syndrome   4. Greater trochanteric bursitis, right     Plan: Findings:  Chronic history of low back pain and chronic pain syndrome likely some underlying fibromyalgia status post lumbar fusion at L4-5.  Patient typically followed by Dr. Vira BrownsJames Nitka, who performed her surgery.  We have seen her on remote occasions for trochanteric bursitis but this pain today is different.  No red flag complaints other than some radicular type tingling.  She has had no specific injury.  She has had no bowel or bladder difficulties etc.  I think the best approach is transforaminal epidural steroid injection bilateral S1.  See how she does get a follow-up with Dr. Otelia SergeantNitka.  If she  does not seem to respond may need to repeat MRI of the lumbar spine.  Any future spine injections would really need preprocedure Valium.    Meds & Orders:  Meds ordered this encounter  Medications  . betamethasone acetate-betamethasone sodium phosphate (CELESTONE) injection 12 mg    Orders Placed This Encounter  Procedures  . C-ARM NO Order  . Epidural Steroid injection    Follow-up: Return in about 2 weeks (around 03/14/2019) for Vira Browns,  MD.   Procedures: No procedures performed  S1 Lumbosacral Transforaminal Epidural Steroid Injection - Sub-Pedicular Approach with Fluoroscopic Guidance   Patient: [name]      Date of Birth: 1955-04-11 MRN: [id number] PCP: Bennie Pierini, FNP      Visit Date: 02/28/2019   Universal Protocol:    Date/Time: 11/30/208:55 AM  Consent Given By: the patient  Position:  PRONE  Additional Comments: Vital signs were monitored before and after the procedure. Patient was prepped and draped in the usual sterile fashion. The correct patient, procedure, and site was verified.   Injection Procedure Details:  Procedure Site One Meds Administered:  Meds ordered this encounter  Medications  . betamethasone acetate-betamethasone sodium phosphate (CELESTONE) injection 12 mg    Laterality: Bilateral  Location/Site:  S1 Foramen   Needle size: 22 ga.  Needle type: Spinal  Needle Placement: Transforaminal  Findings:   -Comments: Excellent flow of contrast along the nerve and into the epidural space.  Epidurogram: Contrast epidurogram showed no nerve root cut off or restricted flow pattern.  Procedure Details: After squaring off the sacral end-plate to get a true AP view, the C-arm was positioned so that the best possible view of the S1 foramen was visualized. The soft tissues overlying this structure were infiltrated with 2-3 ml. of 1% Lidocaine without Epinephrine.    The spinal needle was inserted toward the target using a "trajectory" view along the fluoroscope beam.  Under AP and lateral visualization, the needle was advanced so it did not puncture dura. Biplanar projections were used to confirm position. Aspiration was confirmed to be negative for CSF and/or blood. A 1-2 ml. volume of Isovue-250 was injected and flow of contrast was noted at each level. Radiographs were obtained for documentation purposes.   After attaining the desired flow of contrast documented  above, a 0.5 to 1.0 ml test dose of 0.25% Marcaine was injected into each respective transforaminal space.  The patient was observed for 90 seconds post injection.  After no sensory deficits were reported, and normal lower extremity motor function was noted,   the above injectate was administered so that equal amounts of the injectate were placed at each foramen (level) into the transforaminal epidural space.   Additional Comments:  No complications occurred.  Left S1 injection caused initial paresthesia.  Patient does not like injections very well at all and will need contemplate preprocedure Valium if another injection is needed. Dressing: Band-Aid with 2 x 2 sterile gauze    Post-procedure details: Patient was observed during the procedure. Post-procedure instructions were reviewed.  Patient left the clinic in stable condition.    Clinical History: DG HIP (WITH OR WITHOUT PELVIS) 2-3V LEFT  COMPARISON:  April 16, 2017  FINDINGS: Frontal pelvis as well as frontal and lateral left hip images were obtained. No acute fracture or dislocation evident. 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 No erosive change. There is postoperative change in the lower lumbar spine. Sacroiliac joints appear normal bilaterally.  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 No acute fracture or dislocation. Postoperative change lower lumbar spine.   Electronically Signed   By: Lowella Grip III M.D.   On: 08/10/2017 14:23   She reports that she has never smoked. She has never used smokeless tobacco.  Recent Labs    06/15/18 1222  HGBA1C 6.3    Objective:  VS:  HT:    WT:   BMI:     BP:   HR: bpm  TEMP: ( )  RESP:  Physical Exam Vitals signs and nursing note reviewed.  Constitutional:      General: She is not in acute distress.    Appearance: Normal appearance. She is well-developed. She is not ill-appearing.  HENT:     Head: Normocephalic and  atraumatic.  Eyes:     Conjunctiva/sclera: Conjunctivae normal.     Pupils: Pupils are equal, round, and reactive to light.  Cardiovascular:     Rate and Rhythm: Normal rate.     Pulses: Normal pulses.  Pulmonary:     Effort: Pulmonary effort is normal.  Musculoskeletal:     Right lower leg: No edema.     Left lower leg: No edema.     Comments: Patient is very difficult to rise from a seated position.  She has a equivocal slump test on the left.  Good distal strength.  Mild pain over the trochanters.  Skin:    General: Skin is warm and dry.     Findings: No erythema or rash.  Neurological:     General: No focal deficit present.     Mental Status: She is alert and oriented to person, place, and time.     Sensory: No sensory deficit.     Motor: Weakness present. No abnormal muscle tone.     Coordination: Coordination normal.     Gait: Gait abnormal.  Psychiatric:        Mood and Affect: Mood normal.        Behavior: Behavior normal.     Ortho Exam Imaging: No results found.  Past Medical/Family/Surgical/Social History: Medications & Allergies reviewed per EMR, new medications updated. Patient Active Problem List   Diagnosis Date Noted  . Chest pain 01/17/2019  . Unstable angina ([place]) 10/20/2017  . Encounter for chronic pain management 09/23/2016  . Pre-diabetes 09/02/2016  . Recurrent major depressive disorder, in partial remission ([place]) 07/14/2016  . Seizures ([place]) 07/14/2016  . GAD (generalized anxiety disorder) 07/14/2016  . Insomnia 09/05/2014  . Allergic rhinitis 09/05/2014  . Cervical spondylosis without myelopathy 12/19/2013    Class: Chronic  . Neural foraminal stenosis of cervical spine 12/19/2013  . Cervical radiculitis 09/19/2013  . Lumbosacral spondylosis without myelopathy 11/16/2012  . Postlaminectomy syndrome, lumbar region 11/16/2012  . Herpes simplex virus (HSV) infection 10/31/2008  . Hyperlipidemia with target LDL less than 100 10/31/2008  .  Essential hypertension 10/31/2008  . GERD 10/31/2008  . SPINAL STENOSIS OF LUMBAR REGION 10/31/2008  . Myalgia 10/31/2008  . Osteoporosis 10/31/2008  . SPONDYLOLISTHESIS 10/31/2008  . SYNCOPE 10/31/2008  . Sleep apnea 10/31/2008   Past Medical History:  Diagnosis Date  . Anginal pain ([place])    last time   . Anxiety   . Arthritis    RHEUMATOID  . Asthma   . Bipolar 1 disorder ([place])   . Cataracts, bilateral 07/2017  . COPD (chronic obstructive pulmonary disease) ([place])   . Coronary artery disease    reported hx of "MI";  Echo 2009 with  normal LVF;  Myoview 05/2011: no ischemia  . Depression   . Dyslipidemia   . Dysrhythmia    SVT  . Esophageal stricture   . Fibromyalgia   . GERD (gastroesophageal reflux disease)   . H/O hiatal hernia   . Head injury, unspecified   . Herpes simplex infection   . History of kidney stones   . Hyperlipidemia   . Hypertension   . Insomnia   . Myocardial infarction [facility])    age 64  . Osteoporosis   . Pneumonia    hx  . Seizures (HCC)    last 3 weeks ago- prescribed keppra -never took didnt like side effects read about online  . Shortness of breath   . Sleep apnea    ? neg  . Spinal stenosis of lumbar region   . Spondylolisthesis   . Status post placement of implantable loop recorder   . Supraventricular tachycardia (HCC)   . Syncope and collapse    s/p ILR; no arhythmogenic cause identified  . UTI (lower urinary tract infection)    Family History  Problem Relation Age of Onset  . Heart attack Father   . Cancer Father   . Mental illness Father   . Cancer Mother   . Mental illness Mother   . Heart attack Brother        stents  . Alcohol abuse Brother   . Heart disease Brother   . Drug abuse Brother   . Diabetes Brother   . Colon cancer Maternal Aunt   . Cirrhosis Brother   . Stomach cancer Neg Hx    Past Surgical History:  Procedure Laterality Date  . BACK SURGERY    . BREAST SURGERY     lumpectomy  . CATARACT EXTRACTION  W/PHACO Right 07/31/2017   Procedure: CATARACT EXTRACTION PHACO AND INTRAOCULAR LENS PLACEMENT (IOC);  Surgeon: Fabio Pierce, MD;  Location: [facility];  Service: Ophthalmology;  Laterality: Right;  CDE: 2.33  . CATARACT EXTRACTION W/PHACO Left 08/14/2017   Procedure: CATARACT EXTRACTION PHACO AND INTRAOCULAR LENS PLACEMENT (IOC);  Surgeon: Fabio Pierce, MD;  Location: [facility];  Service: Ophthalmology;  Laterality: Left;  CDE: 2.74  . CHOLECYSTECTOMY    . CYSTOSCOPY     stone  . DOPPLER ECHOCARDIOGRAPHY  2009  . head up tilt table testing  06/15/2007   [name]  . HEMORRHOID SURGERY    . insertion of implatable loop recorder  08/11/2007   [name]  . POSTERIOR CERVICAL FUSION/FORAMINOTOMY N/A 12/19/2013   Procedure: RIGHT C3-4.C4-5 AND C5-6 FORAMINOTOMIES;  Surgeon: Kerrin Champagne, MD;  Location: [facility];  Service: Orthopedics;  Laterality: N/A;  . TUBAL LIGATION     Social History   Occupational History  . Occupation: Disability    Comment: 15 years  Tobacco Use  . Smoking status: Never Smoker  . Smokeless tobacco: Never Used  Substance and Sexual Activity  . Alcohol use: No    Alcohol/week: 0.0 standard drinks  . Drug use: No  . Sexual activity: Not Currently

## 2019-02-28 NOTE — Procedures (Signed)
S1 Lumbosacral Transforaminal Epidural Steroid Injection - Sub-Pedicular Approach with Fluoroscopic Guidance   Patient: [name]      Date of Birth: 05-Jun-1955 MRN: [id number] PCP: Chevis Pretty, FNP      Visit Date: 02/28/2019   Universal Protocol:    Date/Time: 11/30/208:55 AM  Consent Given By: the patient  Position:  PRONE  Additional Comments: Vital signs were monitored before and after the procedure. Patient was prepped and draped in the usual sterile fashion. The correct patient, procedure, and site was verified.   Injection Procedure Details:  Procedure Site One Meds Administered:  Meds ordered this encounter  Medications  . betamethasone acetate-betamethasone sodium phosphate (CELESTONE) injection 12 mg    Laterality: Bilateral  Location/Site:  S1 Foramen   Needle size: 22 ga.  Needle type: Spinal  Needle Placement: Transforaminal  Findings:   -Comments: Excellent flow of contrast along the nerve and into the epidural space.  Epidurogram: Contrast epidurogram showed no nerve root cut off or restricted flow pattern.  Procedure Details: After squaring off the sacral end-plate to get a true AP view, the C-arm was positioned so that the best possible view of the S1 foramen was visualized. The soft tissues overlying this structure were infiltrated with 2-3 ml. of 1% Lidocaine without Epinephrine.    The spinal needle was inserted toward the target using a "trajectory" view along the fluoroscope beam.  Under AP and lateral visualization, the needle was advanced so it did not puncture dura. Biplanar projections were used to confirm position. Aspiration was confirmed to be negative for CSF and/or blood. A 1-2 ml. volume of Isovue-250 was injected and flow of contrast was noted at each level. Radiographs were obtained for documentation purposes.   After attaining the desired flow of contrast documented above, a 0.5 to 1.0 ml test dose of 0.25%  Marcaine was injected into each respective transforaminal space.  The patient was observed for 90 seconds post injection.  After no sensory deficits were reported, and normal lower extremity motor function was noted,   the above injectate was administered so that equal amounts of the injectate were placed at each foramen (level) into the transforaminal epidural space.   Additional Comments:  No complications occurred.  Left S1 injection caused initial paresthesia.  Patient does not like injections very well at all and will need contemplate preprocedure Valium if another injection is needed. Dressing: Band-Aid with 2 x 2 sterile gauze    Post-procedure details: Patient was observed during the procedure. Post-procedure instructions were reviewed.  Patient left the clinic in stable condition.

## 2019-03-03 ENCOUNTER — Other Ambulatory Visit: Payer: Self-pay | Admitting: Nurse Practitioner

## 2019-03-03 DIAGNOSIS — K2100 Gastro-esophageal reflux disease with esophagitis, without bleeding: Secondary | ICD-10-CM

## 2019-03-03 DIAGNOSIS — J309 Allergic rhinitis, unspecified: Secondary | ICD-10-CM

## 2019-03-03 DIAGNOSIS — N941 Unspecified dyspareunia: Secondary | ICD-10-CM

## 2019-03-03 DIAGNOSIS — R569 Unspecified convulsions: Secondary | ICD-10-CM

## 2019-03-07 ENCOUNTER — Other Ambulatory Visit: Payer: Self-pay

## 2019-03-08 ENCOUNTER — Other Ambulatory Visit: Payer: Self-pay | Admitting: Nurse Practitioner

## 2019-03-08 ENCOUNTER — Encounter: Payer: Self-pay | Admitting: Nurse Practitioner

## 2019-03-08 ENCOUNTER — Ambulatory Visit (INDEPENDENT_AMBULATORY_CARE_PROVIDER_SITE_OTHER): Payer: 59 | Admitting: Nurse Practitioner

## 2019-03-08 VITALS — BP 140/92 | HR 71 | Temp 98.9°F | Resp 20 | Ht 62.0 in | Wt 155.0 lb

## 2019-03-08 DIAGNOSIS — F331 Major depressive disorder, recurrent, moderate: Secondary | ICD-10-CM

## 2019-03-08 DIAGNOSIS — F5101 Primary insomnia: Secondary | ICD-10-CM

## 2019-03-08 DIAGNOSIS — F3341 Major depressive disorder, recurrent, in partial remission: Secondary | ICD-10-CM

## 2019-03-08 DIAGNOSIS — R7303 Prediabetes: Secondary | ICD-10-CM

## 2019-03-08 DIAGNOSIS — M47812 Spondylosis without myelopathy or radiculopathy, cervical region: Secondary | ICD-10-CM

## 2019-03-08 DIAGNOSIS — G8929 Other chronic pain: Secondary | ICD-10-CM

## 2019-03-08 DIAGNOSIS — R569 Unspecified convulsions: Secondary | ICD-10-CM

## 2019-03-08 DIAGNOSIS — N941 Unspecified dyspareunia: Secondary | ICD-10-CM

## 2019-03-08 DIAGNOSIS — J309 Allergic rhinitis, unspecified: Secondary | ICD-10-CM

## 2019-03-08 DIAGNOSIS — E785 Hyperlipidemia, unspecified: Secondary | ICD-10-CM | POA: Diagnosis not present

## 2019-03-08 DIAGNOSIS — A6004 Herpesviral vulvovaginitis: Secondary | ICD-10-CM

## 2019-03-08 DIAGNOSIS — G44229 Chronic tension-type headache, not intractable: Secondary | ICD-10-CM

## 2019-03-08 DIAGNOSIS — K2100 Gastro-esophageal reflux disease with esophagitis, without bleeding: Secondary | ICD-10-CM

## 2019-03-08 DIAGNOSIS — I200 Unstable angina: Secondary | ICD-10-CM

## 2019-03-08 DIAGNOSIS — B009 Herpesviral infection, unspecified: Secondary | ICD-10-CM

## 2019-03-08 DIAGNOSIS — I10 Essential (primary) hypertension: Secondary | ICD-10-CM | POA: Diagnosis not present

## 2019-03-08 DIAGNOSIS — F411 Generalized anxiety disorder: Secondary | ICD-10-CM

## 2019-03-08 LAB — BAYER DCA HB A1C WAIVED: HB A1C (BAYER DCA - WAIVED): 6.4 % (ref <7.0)

## 2019-03-08 MED ORDER — LIDOCAINE 5 % EX OINT: 1.0000 "application " | TOPICAL_OINTMENT | Freq: Three times a day (TID) | CUTANEOUS | 0 refills | Status: DC | PRN

## 2019-03-08 MED ORDER — ESCITALOPRAM OXALATE 20 MG PO TABS: 20.0000 mg | ORAL_TABLET | Freq: Every day | ORAL | 1 refills | Status: DC

## 2019-03-08 MED ORDER — FUROSEMIDE 40 MG PO TABS: 40.0000 mg | ORAL_TABLET | Freq: Every day | ORAL | 1 refills | Status: DC | PRN

## 2019-03-08 MED ORDER — TRAZODONE HCL 150 MG PO TABS: 150.0000 mg | ORAL_TABLET | Freq: Every day | ORAL | 1 refills | Status: DC

## 2019-03-08 MED ORDER — ONDANSETRON HCL 4 MG PO TABS: ORAL_TABLET | ORAL | 0 refills | Status: DC

## 2019-03-08 MED ORDER — OXYBUTYNIN CHLORIDE 5 MG PO TABS: 5.0000 mg | ORAL_TABLET | Freq: Two times a day (BID) | ORAL | 4 refills | Status: DC

## 2019-03-08 MED ORDER — VALACYCLOVIR HCL 500 MG PO TABS: 500.0000 mg | ORAL_TABLET | Freq: Two times a day (BID) | ORAL | 1 refills | Status: DC

## 2019-03-08 MED ORDER — ROSUVASTATIN CALCIUM 10 MG PO TABS: 10.0000 mg | ORAL_TABLET | Freq: Every day | ORAL | 1 refills | Status: DC

## 2019-03-08 MED ORDER — ESTRADIOL 0.1 MG/GM VA CREA: 1.0000 | TOPICAL_CREAM | Freq: Every day | VAGINAL | 0 refills | Status: DC

## 2019-03-08 MED ORDER — HYDROCODONE-ACETAMINOPHEN 7.5-325 MG PO TABS: 1.0000 | ORAL_TABLET | Freq: Four times a day (QID) | ORAL | 0 refills | Status: DC | PRN

## 2019-03-08 MED ORDER — IPRATROPIUM BROMIDE 0.02 % IN SOLN: 0.5000 mg | RESPIRATORY_TRACT | 1 refills | Status: DC | PRN

## 2019-03-08 MED ORDER — ACYCLOVIR 5 % EX OINT: 1.0000 "application " | TOPICAL_OINTMENT | Freq: Every day | CUTANEOUS | 2 refills | Status: DC | PRN

## 2019-03-08 MED ORDER — BREO ELLIPTA 200-25 MCG/INH IN AEPB: 1.0000 | INHALATION_SPRAY | Freq: Every day | RESPIRATORY_TRACT | 5 refills | Status: DC

## 2019-03-08 MED ORDER — ISOSORBIDE MONONITRATE ER 30 MG PO TB24: 30.0000 mg | ORAL_TABLET | Freq: Every day | ORAL | 1 refills | Status: DC

## 2019-03-08 MED ORDER — DULOXETINE HCL 60 MG PO CPEP: 60.0000 mg | ORAL_CAPSULE | Freq: Every day | ORAL | 5 refills | Status: DC

## 2019-03-08 MED ORDER — BUSPIRONE HCL 10 MG PO TABS: 10.0000 mg | ORAL_TABLET | Freq: Two times a day (BID) | ORAL | 1 refills | Status: DC | PRN

## 2019-03-08 MED ORDER — DEXILANT 60 MG PO CPDR: 60.0000 mg | DELAYED_RELEASE_CAPSULE | Freq: Every day | ORAL | 1 refills | Status: DC

## 2019-03-08 MED ORDER — BREO ELLIPTA 200-25 MCG/INH IN AEPB: 1.0000 | INHALATION_SPRAY | Freq: Every day | RESPIRATORY_TRACT | 0 refills | Status: DC

## 2019-03-08 MED ORDER — HYDROCODONE-ACETAMINOPHEN 7.5-325 MG PO TABS: 1.0000 | ORAL_TABLET | Freq: Two times a day (BID) | ORAL | 0 refills | Status: DC

## 2019-03-08 MED ORDER — LEVETIRACETAM 500 MG PO TABS: 500.0000 mg | ORAL_TABLET | Freq: Two times a day (BID) | ORAL | 1 refills | Status: DC

## 2019-03-08 MED ORDER — BUTALBITAL-APAP-CAFFEINE 50-325-40 MG PO TABS: 1.0000 | ORAL_TABLET | Freq: Four times a day (QID) | ORAL | 1 refills | Status: DC | PRN

## 2019-03-08 NOTE — Patient Instructions (Signed)

## 2019-03-08 NOTE — Progress Notes (Signed)
Subjective:    Patient ID: [name], female    DOB: 08-20-1955, 63 y.o.   MRN: [id number]   Chief Complaint: medical management of chronic issues   HPI: Patient comes in today for follow up. Sh ehas lots of chronic issues. And is on lots of medication. She also sees cardiology , and neurology. Sh ehas been having lots of problems with chest pain and was in ER several months ago and all test were negative for MI. She has had increasing back pain and is scheduled for steroid injections in her back which did not help. She has had no recent seizure activity, but is still having frequent headaches.the fiorcet helps. She has not been checking blood sugars at home very often and has not been watching diet or exercising. She has major fluctuations in her blood pressure in which she will take extra dose of her blood pressure med as instructed by cardiology. BP Readings from Last 3 Encounters:  02/03/19 (!) 205/107  01/19/19 (!) 139/99  12/16/18 (!) 184/107   Lab Results  Component Value Date   CHOL 169 10/26/2018   HDL 50 10/26/2018   LDLCALC 89 10/26/2018   LDLDIRECT 142.1 04/22/2007   TRIG 151 (H) 10/26/2018   CHOLHDL 3.4 10/26/2018   Lab Results  Component Value Date   HGBA1C 6.3 06/15/2018   Pain assessment: Cause of pain- spondylosis- sh ehas had back surgery in the past. Pain location- lower back Pain on scale of 1-10- 7/10 Frequency- daily What increases pain-movement What makes pain Better-pain meds help- had injections which did not hlp Effects on ADL - some days she can not do much of anything because of pain Any change in general medical condition-none  Current opioids rx- norco 7.5/325 # meds rx- 60 Effectiveness of current meds-helps Adverse reactions form pain meds-none Morphine equivalent-  Pill count performed-No Last drug screen - 7/828/20 ( high risk q30m, moderate risk q27m, low risk yearly ) Urine drug screen today- No Was the [facility] reviewed-  yes  If yes were their any concerning findings? - none  Pain contract signed on: 03/08/19  1. Essential hypertension  2. Gastroesophageal reflux disease with esophagitis without hemorrhage  3. Hyperlipidemia with target LDL less than 100  4. Primary insomnia  5. Recurrent major depressive disorder, in partial remission (HCC)  6. Seizures (HCC)  7. GAD (generalized anxiety disorder)  8. Pre-diabetes  9. Encounter for chronic pain management  10. Herpes simplex virus (HSV) infection     Outpatient Encounter Medications as of 03/08/2019  Medication Sig  . acyclovir ointment (ZOVIRAX) 5 % APPLY TO AFFECTED AREA EVERY 3 HOURS (Patient taking differently: Apply 1 application topically daily as needed (to affected area(s)). )  . albuterol (PROVENTIL) (2.5 MG/3ML) 0.083% nebulizer solution USE 1 VIAL IN NEBULIZER EVERY 4 HOURS AS NEEDED FOR WHEEZING. (Patient taking differently: Take 2.5 mg by nebulization every 4 (four) hours as needed for wheezing. )  . albuterol (VENTOLIN HFA) 108 (90 Base) MCG/ACT inhaler INHALE 2 PUFFS EVERY 6 HOURS AS NEEDED FOR SHORTNESS OF BREATH AND WHEEZING.  [name] Alcohol Swabs (GLOBAL ALCOHOL PREP EASE) 70 % PADS USE 1 PAD DAILY WHEN CHECKING BLOOD SUGAR.  [name] BREO ELLIPTA 200-25 MCG/INH AEPB INHALE 1 PUFF ONCE DAILY.  . busPIRone (BUSPAR) 10 MG tablet 1 po bid prn (Patient taking differently: Take 10 mg by mouth 2 (two) times daily as needed (for mood/ as directed). )  . Butalbital-Acetaminophen (BUTALBITAL-APAP PO) Take by mouth.  [name]  butalbital-acetaminophen-caffeine (FIORICET) 50-325-40 MG tablet Take 1 tablet by mouth every 6 (six) hours as needed for headache.  . carvedilol (COREG) 6.25 MG tablet Take 1.5 tablets (9.375 mg total) by mouth 2 (two) times daily.  [name] DEXILANT 60 MG capsule TAKE 1 CAPSULE BY MOUTH ONCE DAILY. (Patient taking differently: Take 60 mg by mouth daily. )  . diazepam (VALIUM) 5 MG tablet Take 1 tablet (5 mg total) by mouth 2 (two) times  daily.  . DULoxetine (CYMBALTA) 60 MG capsule Take 1 capsule (60 mg total) by mouth at bedtime.  [name] escitalopram (LEXAPRO) 20 MG tablet TAKE (1) TABLET BY MOUTH ONCE DAILY. (Patient taking differently: Take 20 mg by mouth daily. )  . estradiol (ESTRACE) 0.1 MG/GM vaginal cream PLACE 1 APPLICATORFUL VAGINALLY AT BEDTIME.  . fludrocortisone (FLORINEF) 0.1 MG tablet Take 1 tablet (0.1 mg total) by mouth daily.  . fluticasone (FLONASE) 50 MCG/ACT nasal spray SPRAY 2 SPRAYS IN EACH NOSTRIL ONCE DAILY.  . furosemide (LASIX) 40 MG tablet Take 1 tablet (40 mg total) by mouth daily. (Patient taking differently: Take 40 mg by mouth daily as needed for fluid. )  . HYDROcodone-acetaminophen (NORCO) 7.5-325 MG tablet Take 1 tablet by mouth 2 (two) times a day.  . ipratropium (ATROVENT) 0.02 % nebulizer solution USE 1 VIAL IN NEBULIZER EVERY 4 HOURS AS NEEDED FOR WHEEZING. (Patient taking differently: Take 0.5 mg by nebulization every 4 (four) hours as needed for wheezing. )  . isosorbide mononitrate (IMDUR) 30 MG 24 hr tablet Take 1 tablet (30 mg total) by mouth daily.  [name] lamoTRIgine (LAMICTAL) 150 MG tablet TAKE 1 TABLET BY MOUTH ONCE DAILY. (Patient taking differently: Take 150 mg by mouth daily. )  . levETIRAcetam (KEPPRA) 500 MG tablet TAKE (1) TABLET TWICE DAILY.  [name] levocetirizine (XYZAL) 5 MG tablet TAKE 1 TABLET BY MOUTH EVERY MORNING. (Patient taking differently: Take 5 mg by mouth daily. )  . lidocaine (XYLOCAINE) 5 % ointment Apply 1 application topically 3 (three) times daily as needed. (pain) (Patient taking differently: Apply 1 application topically 3 (three) times daily as needed for mild pain or moderate pain. )  . nitroGLYCERIN (NITROSTAT) 0.4 MG SL tablet PLACE ONE (1) TABLET UNDER TONGUE EVERY 5 MINUTES UP TO (3) DOSES AS NEEDED FOR CHEST PAIN.  [name] ondansetron (ZOFRAN) 4 MG tablet TAKE 1 TABLET BY MOUTH EVERY 8 HOURS AS NEEDED FOR NAUSEA AND VOMITING.  [name] oxybutynin (DITROPAN) 5 MG tablet Take 1  tablet (5 mg total) by mouth 2 (two) times daily.  . predniSONE (DELTASONE) 10 MG tablet Take as directed for 12 days.  Daily dose 6,6,5,5,4,4,3,3,2,2,1,1.  . rosuvastatin (CRESTOR) 10 MG tablet TAKE (1) TABLET BY MOUTH ONCE DAILY. (Patient taking differently: Take 10 mg by mouth daily. )  . sucralfate (CARAFATE) 1 g tablet TAKE 1 TABLET 4 TIMES DAILY - WITH MEALS AND AT BEDTIME  . tiZANidine (ZANAFLEX) 4 MG tablet TAKE (1) TABLET EVERY SIX HOURS AS NEEDED FOR MUSCLE SPASMS.  [name] tolterodine (DETROL) 2 MG tablet TAKE (1) TABLET TWICE DAILY.  . traZODone (DESYREL) 150 MG tablet Take 1 tablet (150 mg total) by mouth at bedtime.  . valACYclovir (VALTREX) 500 MG tablet TAKE (1) TABLET TWICE DAILY. (Patient taking differently: Take 500 mg by mouth 2 (two) times daily. )   No facility-administered encounter medications on file as of 03/08/2019.     Past Surgical History:  Procedure Laterality Date  . BACK SURGERY    . BREAST SURGERY  lumpectomy  . CATARACT EXTRACTION W/PHACO Right 07/31/2017   Procedure: CATARACT EXTRACTION PHACO AND INTRAOCULAR LENS PLACEMENT (IOC);  Surgeon: Fabio PierceWrzosek, James, MD;  Location: [facility];  Service: Ophthalmology;  Laterality: Right;  CDE: 2.33  . CATARACT EXTRACTION W/PHACO Left 08/14/2017   Procedure: CATARACT EXTRACTION PHACO AND INTRAOCULAR LENS PLACEMENT (IOC);  Surgeon: Fabio PierceWrzosek, James, MD;  Location: [facility];  Service: Ophthalmology;  Laterality: Left;  CDE: 2.74  . CHOLECYSTECTOMY    . CYSTOSCOPY     stone  . DOPPLER ECHOCARDIOGRAPHY  2009  . head up tilt table testing  06/15/2007   Lewayne BuntingGregg Taylor  . HEMORRHOID SURGERY    . insertion of implatable loop recorder  08/11/2007   Lewayne BuntingGregg Taylor  . POSTERIOR CERVICAL FUSION/FORAMINOTOMY N/A 12/19/2013   Procedure: RIGHT C3-4.C4-5 AND C5-6 FORAMINOTOMIES;  Surgeon: Kerrin ChampagneJames E Nitka, MD;  [facility];  Service: Orthopedics;  Laterality: N/A;  . TUBAL LIGATION      Family History  Problem Relation Age of Onset  . Heart  attack Father   . Cancer Father   . Mental illness Father   . Cancer Mother   . Mental illness Mother   . Heart attack Brother        stents  . Alcohol abuse Brother   . Heart disease Brother   . Drug abuse Brother   . Diabetes Brother   . Colon cancer Maternal Aunt   . Cirrhosis Brother   . Stomach cancer Neg Hx     New complaints: Her main complaint to me today was her heart burn seems to be worsening and would like referral back to GI.  Social history: Lives alone now in an apartment in [place]    Review of Systems  Constitutional: Negative for activity change and appetite change.  HENT: Negative.   Eyes: Negative for pain.  Respiratory: Negative for shortness of breath.   Cardiovascular: Negative for chest pain, palpitations and leg swelling.  Gastrointestinal: Negative for abdominal pain.       Heartburn has increased  Endocrine: Negative for polydipsia.  Genitourinary: Negative.   Musculoskeletal: Positive for arthralgias, back pain and myalgias.  Skin: Negative for rash.  Neurological: Negative for dizziness, weakness and headaches.  Hematological: Does not bruise/bleed easily.  Psychiatric/Behavioral: Negative.   All other systems reviewed and are negative.      Objective:   Physical Exam Vitals signs and nursing note reviewed.  Constitutional:      General: She is not in acute distress.    Appearance: Normal appearance. She is well-developed.  HENT:     Head: Normocephalic.     Nose: Nose normal.  Eyes:     Pupils: Pupils are equal, round, and reactive to light.  Neck:     Musculoskeletal: Normal range of motion and neck supple.     Vascular: No carotid bruit or JVD.  Cardiovascular:     Rate and Rhythm: Normal rate and regular rhythm.     Heart sounds: Normal heart sounds.  Pulmonary:     Effort: Pulmonary effort is normal. No respiratory distress.     Breath sounds: Normal breath sounds. No wheezing or rales.  Chest:     Chest wall: No  tenderness.  Abdominal:     General: Bowel sounds are normal. There is no distension or abdominal bruit.     Palpations: Abdomen is soft. There is no hepatomegaly, splenomegaly, mass or pulsatile mass.     Tenderness: There is no abdominal tenderness.  Musculoskeletal:  Normal range of motion.     Comments: Limited ROM of back due to pain on flexion and extension and rotation to either side.   Lymphadenopathy:     Cervical: No cervical adenopathy.  Skin:    General: Skin is warm and dry.  Neurological:     Mental Status: She is alert and oriented to person, place, and time.     Deep Tendon Reflexes: Reflexes are normal and symmetric.  Psychiatric:        Behavior: Behavior normal.        Thought Content: Thought content normal.        Judgment: Judgment normal.    BP (!) 140/92   Pulse 71   Temp 98.9 F (37.2 C) (Temporal)   Resp 20 PysrYdV$  (1.575 m)   Wt 155 lb (70.3 kg)   SpO2 94%   BMI 28.35 kg/m         Assessment & Plan:  [name] comes in today with chief complaint of No chief complaint on file.   Diagnosis and orders addressed:  1. Essential hypertension Low sodium diet - furosemide (LASIX) 40 MG tablet; Take 1 tablet (40 mg total) by mouth daily as needed for fluid.  Dispense: 90 tablet; Refill: 1  2. Gastroesophageal reflux disease with esophagitis without hemorrhage Avoid spicy foods Do not eat 2 hours prior to bedtime - Ambulatory referral to Gastroenterology  3. Hyperlipidemia with target LDL less than 100 Low fat diet - rosuvastatin (CRESTOR) 10 MG tablet; Take 1 tablet (10 mg total) by mouth daily.  Dispense: 90 tablet; Refill: 1  4. Primary insomnia Bedtime routine - traZODone (DESYREL) 150 MG tablet; Take 1 tablet (150 mg total) by mouth at bedtime.  Dispense: 90 tablet; Refill: 1  5. Recurrent major depressive disorder, in partial remission (HCC) Stress management  6. Seizures (HCC) - levETIRAcetam (KEPPRA) 500 MG tablet; Take  1 tablet (500 mg total) by mouth 2 (two) times daily.  Dispense: 180 tablet; Refill: 1  7. GAD (generalized anxiety disorder) Stress management  8. Pre-diabetes Low carb diet  9. Encounter for chronic pain management See dx of crvical spine pain  10. Herpes simplex virus (HSV) infection - valACYclovir (VALTREX) 500 MG tablet; Take 1 tablet (500 mg total) by mouth 2 (two) times daily.  Dispense: 180 tablet; Refill: 1 - lidocaine (XYLOCAINE) 5 % ointment; Apply 1 application topically 3 (three) times daily as needed for mild pain or moderate pain. (pain)  Dispense: 240 g; Refill: 0  11. Allergic rhinitis, unspecified seasonality, unspecified trigger - fluticasone furoate-vilanterol (BREO ELLIPTA) 200-25 MCG/INH AEPB; Inhale 1 puff into the lungs daily.  Dispense: 60 each; Refill: 5  12. Dyspareunia in female - estradiol (ESTRACE) 0.1 MG/GM vaginal cream; Place 1 Applicatorful vaginally at bedtime.  Dispense: 42.5 g; Refill: 0  13. Gastroesophageal reflux disease with esophagitis Avoid spicy foods Do not eat 2 hours prior to bedtime - dexlansoprazole (DEXILANT) 60 MG capsule; Take 1 capsule (60 mg total) by mouth daily.  Dispense: 90 capsule; Refill: 1  14. Herpes simplex vulvovaginitis - valACYclovir (VALTREX) 500 MG tablet; Take 1 tablet (500 mg total) by mouth 2 (two) times daily.  Dispense: 180 tablet; Refill: 1 - lidocaine (XYLOCAINE) 5 % ointment; Apply 1 application topically 3 (three) times daily as needed for mild pain or moderate pain. (pain)  Dispense: 240 g; Refill: 0  15. Moderate episode of recurrent major depressive disorder (HCC)  - DULoxetine (CYMBALTA) 60  MG capsule; Take 1 capsule (60 mg total) by mouth at bedtime.  Dispense: 30 capsule; Refill: 5 - escitalopram (LEXAPRO) 20 MG tablet; Take 1 tablet (20 mg total) by mouth daily.  Dispense: 90 tablet; Refill: 1  16. Unstable angina (HCC)  - isosorbide mononitrate (IMDUR) 30 MG 24 hr tablet; Take 1 tablet (30 mg  total) by mouth daily.  Dispense: 90 tablet; Refill: 1  17. Anxiety state  - busPIRone (BUSPAR) 10 MG tablet; Take 1 tablet (10 mg total) by mouth 2 (two) times daily as needed (for mood/ as directed).  Dispense: 180 tablet; Refill: 1  18. Chronic tension-type headache, not intractable  - butalbital-acetaminophen-caffeine (FIORICET) 50-325-40 MG tablet; Take 1 tablet by mouth every 6 (six) hours as needed for headache.  Dispense: 20 tablet; Refill: 1  19. Cervical spondylosis without myelopathy/ low back pain  - HYDROcodone-acetaminophen (NORCO) 7.5-325 MG tablet; Take 1 tablet by mouth 2 (two) times daily.  Dispense: 60 tablet; Refill: 0 - HYDROcodone-acetaminophen (NORCO) 7.5-325 MG tablet; Take 1 tablet by mouth every 6 (six) hours as needed for moderate pain.  Dispense: 30 tablet; Refill: 0 - HYDROcodone-acetaminophen (NORCO) 7.5-325 MG tablet; Take 1 tablet by mouth every 6 (six) hours as needed for moderate pain.  Dispense: 30 tablet; Refill: 0   Labs pending Health Maintenance reviewed Diet and exercise encouraged  Follow up plan: 3 months   [name] Daphine DeutscherMartin, FNP

## 2019-03-09 ENCOUNTER — Encounter: Payer: Self-pay | Admitting: Gastroenterology

## 2019-03-09 LAB — CMP14+EGFR
ALT: 23 IU/L (ref 0–32)
AST: 16 IU/L (ref 0–40)
Albumin/Globulin Ratio: 1.6 (ref 1.2–2.2)
Albumin: 4.1 g/dL (ref 3.8–4.8)
Alkaline Phosphatase: 137 IU/L — ABNORMAL HIGH (ref 39–117)
BUN/Creatinine Ratio: 15 (ref 12–28)
BUN: 13 mg/dL (ref 8–27)
Bilirubin Total: <0.2 mg/dL (ref 0.0–1.2)
CO2: 24 mmol/L (ref 20–29)
Calcium: 9.2 mg/dL (ref 8.7–10.3)
Chloride: 103 mmol/L (ref 96–106)
Creatinine, Ser: 0.89 mg/dL (ref 0.57–1.00)
GFR calc Af Amer: 80 mL/min/{1.73_m2} (ref >59)
GFR calc non Af Amer: 70 mL/min/{1.73_m2} (ref >59)
Globulin, Total: 2.6 g/dL (ref 1.5–4.5)
Glucose: 103 mg/dL — ABNORMAL HIGH (ref 65–99)
Potassium: 4.5 mmol/L (ref 3.5–5.2)
Sodium: 142 mmol/L (ref 134–144)
Total Protein: 6.7 g/dL (ref 6.0–8.5)

## 2019-03-09 LAB — LIPID PANEL
Chol/HDL Ratio: 3.3 ratio (ref 0.0–4.4)
Cholesterol, Total: 186 mg/dL (ref 100–199)
HDL: 57 mg/dL (ref >39)
LDL Chol Calc (NIH): 98 mg/dL (ref 0–99)
Triglycerides: 180 mg/dL — ABNORMAL HIGH (ref 0–149)
VLDL Cholesterol Cal: 31 mg/dL (ref 5–40)

## 2019-03-10 ENCOUNTER — Telehealth: Payer: Self-pay | Admitting: Nurse Practitioner

## 2019-03-10 DIAGNOSIS — M47812 Spondylosis without myelopathy or radiculopathy, cervical region: Secondary | ICD-10-CM

## 2019-03-10 MED ORDER — HYDROCODONE-ACETAMINOPHEN 7.5-325 MG PO TABS: 1.0000 | ORAL_TABLET | Freq: Four times a day (QID) | ORAL | 0 refills | Status: DC | PRN

## 2019-03-10 MED ORDER — HYDROCODONE-ACETAMINOPHEN 7.5-325 MG PO TABS: 1.0000 | ORAL_TABLET | Freq: Two times a day (BID) | ORAL | 0 refills | Status: AC

## 2019-03-10 MED ORDER — HYDROCODONE-ACETAMINOPHEN 7.5-325 MG PO TABS: 1.0000 | ORAL_TABLET | Freq: Four times a day (QID) | ORAL | 0 refills | Status: AC | PRN

## 2019-03-10 NOTE — Telephone Encounter (Signed)
Patient called stating that she normally gets 60 pills of the Hydrocodone but says only 30 pills were prescribed. Wants call back from nurse regarding this.

## 2019-03-11 ENCOUNTER — Telehealth: Payer: Self-pay | Admitting: Nurse Practitioner

## 2019-03-11 NOTE — Telephone Encounter (Signed)
Pt had questions regarding her pain medication rx. She just wanted to make sure she was still getting 60 a day. Advised the rx has dispense #60 on them and pt voiced understanding.

## 2019-03-17 ENCOUNTER — Encounter: Payer: Self-pay | Admitting: Cardiology

## 2019-03-17 ENCOUNTER — Telehealth: Payer: Self-pay | Admitting: Nurse Practitioner

## 2019-03-17 ENCOUNTER — Telehealth (INDEPENDENT_AMBULATORY_CARE_PROVIDER_SITE_OTHER): Payer: 59 | Admitting: Cardiology

## 2019-03-17 VITALS — BP 179/117 | HR 117 | Ht 62.0 in | Wt 150.0 lb

## 2019-03-17 DIAGNOSIS — R55 Syncope and collapse: Secondary | ICD-10-CM

## 2019-03-17 DIAGNOSIS — G909 Disorder of the autonomic nervous system, unspecified: Secondary | ICD-10-CM

## 2019-03-17 DIAGNOSIS — R079 Chest pain, unspecified: Secondary | ICD-10-CM

## 2019-03-17 DIAGNOSIS — R0789 Other chest pain: Secondary | ICD-10-CM

## 2019-03-17 MED ORDER — FLUDROCORTISONE ACETATE 0.1 MG PO TABS: 0.1000 mg | ORAL_TABLET | ORAL | 1 refills | Status: DC

## 2019-03-17 NOTE — Telephone Encounter (Signed)
Patient states she took this at home. She wants back on her valium

## 2019-03-17 NOTE — Telephone Encounter (Signed)
I cannot put you back on valium because of your pain meds. New office poilcy due to government changes on controlled substances.

## 2019-03-17 NOTE — Progress Notes (Signed)
Virtual Visit via Telephone Note   This visit type was conducted due to national recommendations for restrictions regarding the COVID-19 Pandemic (e.g. social distancing) in an effort to limit this patient's exposure and mitigate transmission in our community.  Due to her co-morbid illnesses, this patient is at least at moderate risk for complications without adequate follow up.  This format is felt to be most appropriate for this patient at this time.  The patient did not have access to video technology/had technical difficulties with video requiring transitioning to audio format only (telephone).  All issues noted in this document were discussed and addressed.  No physical exam could be performed with this format.  Please refer to the patient's chart for her  consent to telehealth for [facility].   Date:  03/17/2019   ID:  [name], DOB 16-Feb-1956, MRN [id number]  Patient Location: Home Provider Location: Office  PCP:  Bennie PieriniMartin, [name], FNP  Cardiologist:  Dina RichBranch, Noriel Guthrie, MD  Electrophysiologist:  None   Evaluation Performed:  Follow-Up Visit  Chief Complaint:  Follow up  History of Present Illness:    [name] is a 63 y.o. female seen today for follow up of the following medical problems.  1. Chest pain - several year history of chest pain - normal cath in 2013, normal stress test 2013 - [facility] with [facility] pain. Negative workup for ACS. She was discharged with an out patient Lexiscan which showed no evidence of ischemia.  - echo 11/2014 LVEF 55-60%, no WMAs.  -09/2017 nuclear stress no ischemia. - started on imdur by pcpwith improved symptoms.   12/2018 admission with chest pain - no objective evidence of ischemia by EKG or enzymes this admission - echoLVE 60-65%, grade I diastolic dysfunction -  symptoms were noncardiac in description. Lasting constantly x 6 days, worst with deep breathing.  - no recent symptoms.      2. 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 Issues getting arranged with transporation.    - we lowered florinef to 0.1mg  daily due to very high bp's. She is on coreg 9.375mg  bid scheduled   3. HTN - pcp stopped valium earlier this month. Since then she reports elevating bp's - 180s/120s 180/124 201/140 197/127. HRs 57-100s - on coreg 9.375mg  bid, on florinef 0.1mg  daily. - no recent edema - no recent lightheadedness or dizziness.     The patient does not have symptoms concerning for COVID-19 infection (fever, chills, cough, or new shortness of breath).    Past Medical History:  Diagnosis Date  . Anginal pain (HCC)    last time   . Anxiety   . Arthritis    RHEUMATOID  . Asthma   . Bipolar 1 disorder (HCC)   . Cataracts, bilateral 07/2017  . COPD (chronic obstructive pulmonary disease) (HCC)   . Coronary artery disease    reported hx of "MI";  Echo 2009 with normal LVF;  Myoview 05/2011: no ischemia  . Depression   . Dyslipidemia   . Dysrhythmia    SVT  . Esophageal stricture   . Fibromyalgia   . GERD (gastroesophageal reflux disease)   . H/O hiatal hernia   . Head injury, unspecified   . Herpes simplex infection   .  History of kidney stones   . Hyperlipidemia   . Hypertension   . Insomnia   . Myocardial infarction [facility])    age 59  . Osteoporosis   . Pneumonia    hx  . Seizures (HCC)    last 3 weeks ago- prescribed keppra -never took didnt like side effects read about online  . Shortness of breath   . Sleep apnea    ? neg  . Spinal stenosis of lumbar region   . Spondylolisthesis   . Status post placement of implantable loop recorder   . Supraventricular tachycardia (HCC)   .  Syncope and collapse    s/p ILR; no arhythmogenic cause identified  . UTI (lower urinary tract infection)    Past Surgical History:  Procedure Laterality Date  . BACK SURGERY    . BREAST SURGERY     lumpectomy  . CATARACT EXTRACTION W/PHACO Right 07/31/2017   Procedure: CATARACT EXTRACTION PHACO AND INTRAOCULAR LENS PLACEMENT (IOC);  Surgeon: Fabio Pierce, MD;  Location: [facility];  Service: Ophthalmology;  Laterality: Right;  CDE: 2.33  . CATARACT EXTRACTION W/PHACO Left 08/14/2017   Procedure: CATARACT EXTRACTION PHACO AND INTRAOCULAR LENS PLACEMENT (IOC);  Surgeon: Fabio Pierce, MD;  Location: [facility];  Service: Ophthalmology;  Laterality: Left;  CDE: 2.74  . CHOLECYSTECTOMY    . CYSTOSCOPY     stone  . DOPPLER ECHOCARDIOGRAPHY  2009  . head up tilt table testing  06/15/2007   [name]  . HEMORRHOID SURGERY    . insertion of implatable loop recorder  08/11/2007   [name]  . POSTERIOR CERVICAL FUSION/FORAMINOTOMY N/A 12/19/2013   Procedure: RIGHT C3-4.C4-5 AND C5-6 FORAMINOTOMIES;  Surgeon: Kerrin Champagne, MD;  Location: [facility] OR;  Service: Orthopedics;  Laterality: N/A;  . TUBAL LIGATION       No outpatient medications have been marked as taking for the 03/17/19 encounter (Appointment) with Antoine Poche, MD.     Allergies:   Codeine, Morphine and related, Ambien [zolpidem tartrate], Lyrica [pregabalin], and Neurontin [gabapentin]   Social History   Tobacco Use  . Smoking status: Never Smoker  . Smokeless tobacco: Never Used  Substance Use Topics  . Alcohol use: No    Alcohol/week: 0.0 standard drinks  . Drug use: No     Family Hx: The patient's family history includes Alcohol abuse in her brother; Cancer in her father and mother; Cirrhosis in her brother; Colon cancer in her maternal aunt; Diabetes in her brother; Drug abuse in her brother; Heart attack in her brother and father; Heart disease in her brother; Mental illness in her father and mother. There is no  history of Stomach cancer.  ROS:   Please see the history of present illness.     All other systems reviewed and are negative.   Prior CV studies:   The following studies were reviewed today:  05/2011 Lexiscan MPI No ischemia   Jan 2009 Echo LEFT VENTRICLE: - Left ventricular size was normal. - Overall left ventricular systolic function was normal. - There were no left ventricular regional wall motion abnormalities. - Left ventricular wall thickness was normal.  AORTIC VALVE: - The aortic valve was trileaflet. - Aortic valve thickness was normal.  AORTA: - The aortic root was normal in size. - The aortic arch was normal.  MITRAL VALVE: - Mitral valve structure was normal.  Doppler interpretation(s): - There was trivial mitral valvular regurgitation.  LEFT ATRIUM: - Left atrial size was normal.  PULMONARY  VEINS: - The pulmonary veins were grossly normal.  RIGHT VENTRICLE: - Right ventricular size was normal. - Right ventricular systolic function was normal. - Right ventricular wall thickness was normal.  PULMONIC VALVE: - The structure of the pulmonic valve appeared to be normal.  TRICUSPID VALVE: - The tricuspid valve structure was normal.  Doppler interpretation(s): - There was no significant tricuspid valvular regurgitation.  PULMONARY ARTERY: - The pulmonary artery was normal size.  RIGHT ATRIUM: - Right atrial size was normal.  SYSTEMIC VEINS: - The inferior vena cava was normal.  PERICARDIUM: - There was no pericardial effusion.  ---------------------------------------------------------------  SUMMARY - Overall left ventricular systolic function was normal. There were no left ventricular regional wall motion abnormalities. - The pulmonary veins were grossly normal.   09/2011 Cath Hemodynamic Findings: Central aortic pressure: 108/58  Left ventricular pressure: 107/10/14  Angiographic Findings:  Left main: No obstructive  disease noted.  Left Anterior Descending Artery: Moderate to large sized vessel that courses to the apex. There is a moderate sized diagonal Starr Engel. No obstructive disease noted.  Circumflex Artery: Large, dominant artery with moderate sized first obtuse marginal Taren Dymek and left sided posterolateral Danira Nylander with no disease noted.  Right Coronary Artery: Small, non-dominant vessel with no disease noted.  Left Ventricular Angiogram:LVEF=65%.  Impression:  1. No angiographic evidence of CAD  2. Normal LV systolic function  3. Non-cardiac chest pain  Recommendations: No further ischemic workup.  Complications: None. The patient tolerated the procedure well.   11/2014 echo Study Conclusions  - Left ventricle: The cavity size was normal. Systolic function was normal. The estimated ejection fraction was in the range of 55% to 60%. Wall motion was normal; there were no regional wall motion abnormalities. There was an increased relative contribution of atrial contraction to ventricular filling. Doppler parameters are consistent with abnormal left ventricular relaxation (grade 1 diastolic dysfunction). - Mitral valve: There was trivial regurgitation. - Tricuspid valve: There was trivial regurgitation. - Pulmonic valve: There was trivial regurgitation.   09/2017 nuclear stress  There was no ST segment deviation noted during stress. Nonspecific T wave flattening in aVL and V2 seen throughout study.  Defect 1: There is a medium defect of mild severity present in the mid inferior, apical inferior and apical lateral location. This appears to be due to soft tissue attenuation given normal regional wall motion. No ischemic territories.  This is a low risk study.  Nuclear stress EF: 65%.   Labs/Other Tests and Data Reviewed:    EKG:  No ECG reviewed.  Recent Labs: 06/15/2018: TSH 2.560 01/17/2019: Hemoglobin 11.8; Platelets 200 03/08/2019: ALT 23; BUN 13; Creatinine,  Ser 0.89; Potassium 4.5; Sodium 142   Recent Lipid Panel Lab Results  Component Value Date/Time   CHOL 186 03/08/2019 03:41 PM   TRIG 180 (H) 03/08/2019 03:41 PM   TRIG 233 (H) 07/24/2014 10:40 AM   HDL 57 03/08/2019 03:41 PM   HDL 47 07/24/2014 10:40 AM   CHOLHDL 3.3 03/08/2019 03:41 PM   CHOLHDL 4.2 CALC 04/22/2007 10:12 AM   LDLCALC 98 03/08/2019 03:41 PM   LDLDIRECT 142.1 04/22/2007 10:12 AM    Wt Readings from Last 3 Encounters:  03/08/19 155 lb (70.3 kg)  02/03/19 143 lb (64.9 kg)  01/17/19 150 lb (68 kg)     Objective:    Vital Signs:   Today's Vitals   03/17/19 1001  BP: (!) 179/117  Pulse: (!) 117  Weight: 150 lb (68 kg)  Height: 5\' 2"  (1.575  m)   Body mass index is 27.44 kg/m. Normal affect. Normal speech pattern and tone. COmfortable, no apparent distress. No audible signs of SOb or wheezing.   ASSESSMENT & PLAN:    1. Chest pain - long history of symptoms with multiple negative stress tests, most recently7/2019Lexiscan was negative. Cath 2013 with patent vessels - recent admit with atypical chest pain with negative workup  - no recent symptoms, continue to monitor  2. Syncope/Autonomic insufficiency -difficult balance between severe symptomatic both hypotension and HTN. Currently on florinef 0.1mg  daily and coreg 9.375 mg bid. - elevated bp's with no recent presyncope or syncope,  will try taking florinef 0.1mg  every other day  COVID-19 Education: The signs and symptoms of COVID-19 were discussed with the patient and how to seek care for testing (follow up with PCP or arrange E-visit).  The importance of social distancing was discussed today.  Time:   Today, I have spent 12 minutes with the patient with telehealth technology discussing the above problems.     Medication Adjustments/Labs and Tests Ordered: Current medicines are reviewed at length with the patient today.  Concerns regarding medicines are outlined above.   Tests Ordered: No  orders of the defined types were placed in this encounter.   Medication Changes: No orders of the defined types were placed in this encounter.   Follow Up:  Virtual Visit  in 1 month(s)  Signed, Carlyle Dolly, MD  03/17/2019 9:21 AM    [place]

## 2019-03-17 NOTE — Patient Instructions (Signed)
Your physician recommends that you schedule a follow-up appointment in: [place] has recommended you make the following change in your medication:   START FLORINEF 0.1 MG EVERY OTHER DAY   Thank you for choosing [facility]!!

## 2019-03-17 NOTE — Telephone Encounter (Signed)
Patient aware and verbalized understanding. °

## 2019-03-31 ENCOUNTER — Ambulatory Visit: Payer: 59 | Admitting: Specialist

## 2019-04-06 ENCOUNTER — Ambulatory Visit: Payer: 59 | Admitting: Neurology

## 2019-04-07 ENCOUNTER — Other Ambulatory Visit: Payer: Self-pay | Admitting: Nurse Practitioner

## 2019-04-07 DIAGNOSIS — R059 Cough, unspecified: Secondary | ICD-10-CM

## 2019-04-07 DIAGNOSIS — K2100 Gastro-esophageal reflux disease with esophagitis, without bleeding: Secondary | ICD-10-CM

## 2019-04-07 DIAGNOSIS — N941 Unspecified dyspareunia: Secondary | ICD-10-CM

## 2019-04-07 DIAGNOSIS — R569 Unspecified convulsions: Secondary | ICD-10-CM

## 2019-04-07 DIAGNOSIS — B009 Herpesviral infection, unspecified: Secondary | ICD-10-CM

## 2019-04-07 DIAGNOSIS — A6004 Herpesviral vulvovaginitis: Secondary | ICD-10-CM

## 2019-04-07 DIAGNOSIS — R05 Cough: Secondary | ICD-10-CM

## 2019-04-11 ENCOUNTER — Telehealth: Payer: Self-pay | Admitting: Nurse Practitioner

## 2019-04-11 NOTE — Telephone Encounter (Signed)
Patient has a follow up appointment scheduled. 

## 2019-04-13 ENCOUNTER — Other Ambulatory Visit: Payer: Self-pay

## 2019-04-14 ENCOUNTER — Ambulatory Visit: Payer: 59 | Admitting: Gastroenterology

## 2019-04-14 ENCOUNTER — Inpatient Hospital Stay (HOSPITAL_COMMUNITY)
Admission: EM | Admit: 2019-04-14 | Discharge: 2019-04-17 | DRG: 392 | Disposition: A | Discharge status: Home / Self Care | Payer: 59 | Attending: Internal Medicine | Admitting: Internal Medicine

## 2019-04-14 ENCOUNTER — Emergency Department (HOSPITAL_COMMUNITY): Payer: 59

## 2019-04-14 ENCOUNTER — Encounter (HOSPITAL_COMMUNITY): Payer: Self-pay | Admitting: *Deleted

## 2019-04-14 ENCOUNTER — Other Ambulatory Visit: Payer: Self-pay

## 2019-04-14 DIAGNOSIS — J449 Chronic obstructive pulmonary disease, unspecified: Secondary | ICD-10-CM | POA: Diagnosis present

## 2019-04-14 DIAGNOSIS — F411 Generalized anxiety disorder: Secondary | ICD-10-CM | POA: Diagnosis present

## 2019-04-14 DIAGNOSIS — Z888 Allergy status to other drugs, medicaments and biological substances status: Secondary | ICD-10-CM | POA: Diagnosis not present

## 2019-04-14 DIAGNOSIS — I10 Essential (primary) hypertension: Secondary | ICD-10-CM | POA: Diagnosis not present

## 2019-04-14 DIAGNOSIS — M810 Age-related osteoporosis without current pathological fracture: Secondary | ICD-10-CM | POA: Diagnosis present

## 2019-04-14 DIAGNOSIS — F319 Bipolar disorder, unspecified: Secondary | ICD-10-CM | POA: Diagnosis present

## 2019-04-14 DIAGNOSIS — M48061 Spinal stenosis, lumbar region without neurogenic claudication: Secondary | ICD-10-CM | POA: Diagnosis present

## 2019-04-14 DIAGNOSIS — Z833 Family history of diabetes mellitus: Secondary | ICD-10-CM

## 2019-04-14 DIAGNOSIS — Z813 Family history of other psychoactive substance abuse and dependence: Secondary | ICD-10-CM

## 2019-04-14 DIAGNOSIS — K449 Diaphragmatic hernia without obstruction or gangrene: Secondary | ICD-10-CM | POA: Diagnosis present

## 2019-04-14 DIAGNOSIS — I110 Hypertensive heart disease with heart failure: Secondary | ICD-10-CM | POA: Diagnosis present

## 2019-04-14 DIAGNOSIS — M069 Rheumatoid arthritis, unspecified: Secondary | ICD-10-CM | POA: Diagnosis present

## 2019-04-14 DIAGNOSIS — I5032 Chronic diastolic (congestive) heart failure: Secondary | ICD-10-CM | POA: Diagnosis present

## 2019-04-14 DIAGNOSIS — K5732 Diverticulitis of large intestine without perforation or abscess without bleeding: Secondary | ICD-10-CM | POA: Diagnosis present

## 2019-04-14 DIAGNOSIS — Z800 Family history of malignant neoplasm of digestive organs: Secondary | ICD-10-CM | POA: Diagnosis not present

## 2019-04-14 DIAGNOSIS — E785 Hyperlipidemia, unspecified: Secondary | ICD-10-CM | POA: Diagnosis present

## 2019-04-14 DIAGNOSIS — K5790 Diverticulosis of intestine, part unspecified, without perforation or abscess without bleeding: Secondary | ICD-10-CM | POA: Diagnosis present

## 2019-04-14 DIAGNOSIS — G8929 Other chronic pain: Secondary | ICD-10-CM | POA: Diagnosis present

## 2019-04-14 DIAGNOSIS — F3341 Major depressive disorder, recurrent, in partial remission: Secondary | ICD-10-CM | POA: Diagnosis not present

## 2019-04-14 DIAGNOSIS — E663 Overweight: Secondary | ICD-10-CM | POA: Diagnosis not present

## 2019-04-14 DIAGNOSIS — Z811 Family history of alcohol abuse and dependence: Secondary | ICD-10-CM

## 2019-04-14 DIAGNOSIS — Z818 Family history of other mental and behavioral disorders: Secondary | ICD-10-CM

## 2019-04-14 DIAGNOSIS — Z87442 Personal history of urinary calculi: Secondary | ICD-10-CM | POA: Diagnosis not present

## 2019-04-14 DIAGNOSIS — M797 Fibromyalgia: Secondary | ICD-10-CM | POA: Diagnosis present

## 2019-04-14 DIAGNOSIS — K760 Fatty (change of) liver, not elsewhere classified: Secondary | ICD-10-CM | POA: Diagnosis present

## 2019-04-14 DIAGNOSIS — Z885 Allergy status to narcotic agent status: Secondary | ICD-10-CM

## 2019-04-14 DIAGNOSIS — M4310 Spondylolisthesis, site unspecified: Secondary | ICD-10-CM | POA: Diagnosis present

## 2019-04-14 DIAGNOSIS — G40909 Epilepsy, unspecified, not intractable, without status epilepticus: Secondary | ICD-10-CM | POA: Diagnosis present

## 2019-04-14 DIAGNOSIS — Z79891 Long term (current) use of opiate analgesic: Secondary | ICD-10-CM

## 2019-04-14 DIAGNOSIS — Z8249 Family history of ischemic heart disease and other diseases of the circulatory system: Secondary | ICD-10-CM

## 2019-04-14 DIAGNOSIS — Z7951 Long term (current) use of inhaled steroids: Secondary | ICD-10-CM | POA: Diagnosis not present

## 2019-04-14 DIAGNOSIS — I2510 Atherosclerotic heart disease of native coronary artery without angina pectoris: Secondary | ICD-10-CM | POA: Diagnosis present

## 2019-04-14 DIAGNOSIS — I252 Old myocardial infarction: Secondary | ICD-10-CM

## 2019-04-14 DIAGNOSIS — R569 Unspecified convulsions: Secondary | ICD-10-CM | POA: Diagnosis not present

## 2019-04-14 DIAGNOSIS — Z20822 Contact with and (suspected) exposure to covid-19: Secondary | ICD-10-CM | POA: Diagnosis present

## 2019-04-14 DIAGNOSIS — Z79899 Other long term (current) drug therapy: Secondary | ICD-10-CM

## 2019-04-14 DIAGNOSIS — K5792 Diverticulitis of intestine, part unspecified, without perforation or abscess without bleeding: Secondary | ICD-10-CM

## 2019-04-14 LAB — COMPREHENSIVE METABOLIC PANEL
ALT: 16 U/L (ref 0–44)
AST: 23 U/L (ref 15–41)
Albumin: 3.8 g/dL (ref 3.5–5.0)
Alkaline Phosphatase: 135 U/L — ABNORMAL HIGH (ref 38–126)
Anion gap: 12 (ref 5–15)
BUN: 8 mg/dL (ref 8–23)
CO2: 24 mmol/L (ref 22–32)
Calcium: 9.5 mg/dL (ref 8.9–10.3)
Chloride: 101 mmol/L (ref 98–111)
Creatinine, Ser: 0.66 mg/dL (ref 0.44–1.00)
GFR calc Af Amer: >60 mL/min (ref >60)
GFR calc non Af Amer: >60 mL/min (ref >60)
Glucose, Bld: 108 mg/dL — ABNORMAL HIGH (ref 70–99)
Potassium: 4 mmol/L (ref 3.5–5.1)
Sodium: 137 mmol/L (ref 135–145)
Total Bilirubin: 0.6 mg/dL (ref 0.3–1.2)
Total Protein: 8.2 g/dL — ABNORMAL HIGH (ref 6.5–8.1)

## 2019-04-14 LAB — URINALYSIS, ROUTINE W REFLEX MICROSCOPIC
Bilirubin Urine: NEGATIVE
Glucose, UA: NEGATIVE mg/dL
Hgb urine dipstick: NEGATIVE
Ketones, ur: 5 mg/dL — AB
Leukocytes,Ua: NEGATIVE
Nitrite: NEGATIVE
Protein, ur: NEGATIVE mg/dL
Specific Gravity, Urine: >1.046 — ABNORMAL HIGH (ref 1.005–1.030)
pH: 5 (ref 5.0–8.0)

## 2019-04-14 LAB — LIPASE, BLOOD: Lipase: 15 U/L (ref 11–51)

## 2019-04-14 LAB — CBC
HCT: 41 % (ref 36.0–46.0)
Hemoglobin: 12.2 g/dL (ref 12.0–15.0)
MCH: 25.2 pg — ABNORMAL LOW (ref 26.0–34.0)
MCHC: 29.8 g/dL — ABNORMAL LOW (ref 30.0–36.0)
MCV: 84.7 fL (ref 80.0–100.0)
Platelets: 299 10*3/uL (ref 150–400)
RBC: 4.84 MIL/uL (ref 3.87–5.11)
RDW: 15.6 % — ABNORMAL HIGH (ref 11.5–15.5)
WBC: 17.4 10*3/uL — ABNORMAL HIGH (ref 4.0–10.5)
nRBC: 0 % (ref 0.0–0.2)

## 2019-04-14 LAB — TYPE AND SCREEN
ABO/RH(D): A POS
Antibody Screen: NEGATIVE

## 2019-04-14 LAB — RESPIRATORY PANEL BY RT PCR (FLU A&B, COVID)
Influenza A by PCR: NEGATIVE
Influenza B by PCR: NEGATIVE
SARS Coronavirus 2 by RT PCR: NEGATIVE

## 2019-04-14 LAB — LACTIC ACID, PLASMA: Lactic Acid, Venous: 1.4 mmol/L (ref 0.5–1.9)

## 2019-04-14 MED ORDER — KETOROLAC TROMETHAMINE 30 MG/ML IJ SOLN
30.0000 mg | Freq: Once | INTRAMUSCULAR | Status: AC
  Administered 2019-04-14: 20:00:00 30 mg via INTRAVENOUS
  Filled 2019-04-14: qty 1

## 2019-04-14 MED ORDER — HYDROMORPHONE HCL 1 MG/ML IJ SOLN
1.0000 mg | Freq: Once | INTRAMUSCULAR | Status: AC
  Administered 2019-04-14: 19:00:00 1 mg via INTRAVENOUS
  Filled 2019-04-14: qty 1

## 2019-04-14 MED ORDER — LAMOTRIGINE 100 MG PO TABS
150.0000 mg | ORAL_TABLET | Freq: Every day | ORAL | Status: DC
  Administered 2019-04-15 – 2019-04-17 (×3): 150 mg via ORAL
  Filled 2019-04-14 (×3): qty 2

## 2019-04-14 MED ORDER — IOHEXOL 300 MG/ML  SOLN
100.0000 mL | Freq: Once | INTRAMUSCULAR | Status: AC | PRN
  Administered 2019-04-14: 19:00:00 100 mL via INTRAVENOUS

## 2019-04-14 MED ORDER — ALBUTEROL SULFATE HFA 108 (90 BASE) MCG/ACT IN AERS: 2.0000 | INHALATION_SPRAY | Freq: Four times a day (QID) | RESPIRATORY_TRACT | Status: DC | PRN

## 2019-04-14 MED ORDER — METRONIDAZOLE IN NACL 5-0.79 MG/ML-% IV SOLN
500.0000 mg | Freq: Once | INTRAVENOUS | Status: AC
  Administered 2019-04-14: 21:00:00 500 mg via INTRAVENOUS
  Filled 2019-04-14: qty 100

## 2019-04-14 MED ORDER — HYDROMORPHONE HCL 1 MG/ML IJ SOLN
0.5000 mg | INTRAMUSCULAR | Status: DC | PRN
  Administered 2019-04-15 (×4): 1 mg via INTRAVENOUS
  Filled 2019-04-14 (×4): qty 1

## 2019-04-14 MED ORDER — SODIUM CHLORIDE 0.9 % IV SOLN
1000.0000 mL | INTRAVENOUS | Status: DC
  Administered 2019-04-14: 20:00:00 1000 mL via INTRAVENOUS

## 2019-04-14 MED ORDER — ONDANSETRON HCL 4 MG/2ML IJ SOLN
4.0000 mg | Freq: Four times a day (QID) | INTRAMUSCULAR | Status: DC | PRN
  Administered 2019-04-15 – 2019-04-16 (×2): 4 mg via INTRAVENOUS
  Filled 2019-04-14 (×2): qty 2

## 2019-04-14 MED ORDER — SODIUM CHLORIDE 0.9% FLUSH: 3.0000 mL | Freq: Once | INTRAVENOUS | Status: DC

## 2019-04-14 MED ORDER — LEVETIRACETAM 500 MG PO TABS
500.0000 mg | ORAL_TABLET | Freq: Two times a day (BID) | ORAL | Status: DC
  Administered 2019-04-14 – 2019-04-17 (×6): 500 mg via ORAL
  Filled 2019-04-14 (×6): qty 1

## 2019-04-14 MED ORDER — SODIUM CHLORIDE 0.9 % IV SOLN
2.0000 g | Freq: Once | INTRAVENOUS | Status: AC
  Administered 2019-04-14: 20:00:00 2 g via INTRAVENOUS
  Filled 2019-04-14: qty 20

## 2019-04-14 NOTE — H&P (Signed)
History and Physical    [name] WNU:[id number] DOB: 1955-06-01 DOA: 04/14/2019  PCP: Bennie Pierini, FNP   Patient coming from: Home   Chief Complaint: Abdominal pain   HPI: [name] is a 64 y.o. female with medical history significant for seizure disorder, bipolar disorder, COPD, chronic diastolic CHF, now presenting to emergency department for evaluation of abdominal pain.  Patient developed severe generalized abdominal pain a few days ago, has had nausea and vomited some a couple days ago.  The abdominal pain is described as generalized, severe, worsening, and without any alleviating or exacerbating factors identified.  She denies melena or hematochezia.  She reports decreased urine output.  She denies cough or shortness of breath.  ED Course: Upon arrival to the ED, patient is found to be febrile to 38.1 C, tachycardic, and with stable blood pressure.  EKG features sinus rhythm with LVH.  Chest x-ray notable for bibasilar atelectasis.  CT of the abdomen and pelvis concerning for acute sigmoid diverticulitis with minimal adjacent free fluid but no abscess.  Chemistry panel is unremarkable and CBC with leukocytosis to 17,400.  Lactic acid is reassuringly normal.  Blood cultures were ordered from the emergency department and the patient was treated with Dilaudid, Toradol, Rocephin, and Flagyl.  COVID-19 screening test has not yet resulted and hospitalist consulted for admission.  Review of Systems:  All other systems reviewed and apart from HPI, are negative.  Past Medical History:  Diagnosis Date  . Anginal pain (HCC)    last time   . Anxiety   . Arthritis    RHEUMATOID  . Asthma   . Bipolar 1 disorder (HCC)   . Cataracts, bilateral 07/2017  . COPD (chronic obstructive pulmonary disease) (HCC)   . Coronary artery disease    reported hx of "MI";  Echo 2009 with normal LVF;  Myoview 05/2011: no ischemia  . Depression   . Dyslipidemia   . Dysrhythmia    SVT   . Esophageal stricture   . Fibromyalgia   . GERD (gastroesophageal reflux disease)   . H/O hiatal hernia   . Head injury, unspecified   . Herpes simplex infection   . History of kidney stones   . Hyperlipidemia   . Hypertension   . Insomnia   . 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 Osteoporosis   . Pneumonia    hx  . Seizures (HCC)    last 3 weeks ago- prescribed keppra -never took didnt like side effects read about online  . Shortness of breath   . Sleep apnea    ? neg  . Spinal stenosis of lumbar region   . Spondylolisthesis   . Status post placement of implantable loop recorder   . Supraventricular tachycardia (HCC)   . Syncope and collapse    s/p ILR; no arhythmogenic cause identified  . UTI (lower urinary tract infection)     Past Surgical History:  Procedure Laterality Date  . BACK SURGERY    . BREAST SURGERY     lumpectomy  . CATARACT EXTRACTION W/PHACO Right 07/31/2017   Procedure: CATARACT EXTRACTION PHACO AND INTRAOCULAR LENS PLACEMENT (IOC);  Surgeon: Fabio Pierce, MD;  Location: [facility];  Service: Ophthalmology;  Laterality: Right;  CDE: 2.33  . CATARACT EXTRACTION W/PHACO Left 08/14/2017   Procedure: CATARACT EXTRACTION PHACO AND INTRAOCULAR LENS PLACEMENT (IOC);  Surgeon: Fabio Pierce, MD;  Location: [facility];  Service: Ophthalmology;  Laterality: Left;  CDE: 2.74  . CHOLECYSTECTOMY    .  CYSTOSCOPY     stone  . DOPPLER ECHOCARDIOGRAPHY  2009  . head up tilt table testing  06/15/2007   [name]  . HEMORRHOID SURGERY    . insertion of implatable loop recorder  08/11/2007   [name]  . POSTERIOR CERVICAL FUSION/FORAMINOTOMY N/A 12/19/2013   Procedure: RIGHT C3-4.C4-5 AND C5-6 FORAMINOTOMIES;  Surgeon: Kerrin Champagne, MD;  Location[facility];  Service: Orthopedics;  Laterality: N/A;  . TUBAL LIGATION       reports that she has never smoked. She has never used smokeless tobacco. She reports that she does not drink alcohol or use drugs.  Allergies   Allergen Reactions  . Codeine Other (See Comments)    "I will have a heart attack."  . Morphine And Related Other (See Comments)    "It will cause me to have a heart attack."  . Ambien [Zolpidem Tartrate] Nausea And Vomiting  . Lyrica [Pregabalin] Swelling and Other (See Comments)    Weight gain  . Neurontin [Gabapentin] Other (See Comments)    Causes elevated LFTs     Family History  Problem Relation Age of Onset  . Heart attack Father   . Cancer Father   . Mental illness Father   . Cancer Mother   . Mental illness Mother   . Heart attack Brother        stents  . Alcohol abuse Brother   . Heart disease Brother   . Drug abuse Brother   . Diabetes Brother   . Colon cancer Maternal Aunt   . Cirrhosis Brother   . Stomach cancer Neg Hx      Prior to Admission medications   Medication Sig Start Date End Date Taking? Authorizing Provider  acyclovir ointment (ZOVIRAX) 5 % APPLY OINTMENT TO AFFECTED AREA EVERY 3 HOURS. 04/12/19   Daphine Deutscher, [name], FNP  albuterol (PROVENTIL) (2.5 MG/3ML) 0.083% nebulizer solution USE 1 VIAL IN NEBULIZER EVERY 4 HOURS AS NEEDED FOR WHEEZING. Patient taking differently: Take 2.5 mg by nebulization every 4 (four) hours as needed for wheezing.  05/03/18   Daphine Deutscher, [name], FNP  albuterol (VENTOLIN HFA) 108 (90 Base) MCG/ACT inhaler INHALE 2 PUFFS EVERY 6 HOURS AS NEEDED FOR SHORTNESS OF BREATH AND WHEEZING. 04/12/19   Daphine Deutscher [name], FNP  Alcohol Swabs (GLOBAL ALCOHOL PREP EASE) 70 % PADS USE 1 PAD DAILY WHEN CHECKING BLOOD SUGAR. Dx R73.03 04/12/19   Bennie Pierini, FNP  busPIRone (BUSPAR) 10 MG tablet Take 1 tablet (10 mg total) by mouth 2 (two) times daily as needed (for mood/ as directed). 03/08/19   Daphine Deutscher, [name], FNP  butalbital-acetaminophen-caffeine (FIORICET) [phone] MG tablet Take 1 tablet by mouth every 6 (six) hours as needed for headache. 03/08/19   Daphine Deutscher, [name], FNP  carvedilol (COREG) 6.25 MG tablet  Take 1.5 tablets (9.375 mg total) by mouth 2 (two) times daily. 02/03/19   Antoine Poche, MD  dexlansoprazole (DEXILANT) 60 MG capsule Take 1 capsule (60 mg total) by mouth daily. 03/08/19   Daphine Deutscher [name], FNP  DULoxetine (CYMBALTA) 60 MG capsule Take 1 capsule (60 mg total) by mouth at bedtime. 03/08/19   Daphine Deutscher, [name], FNP  escitalopram (LEXAPRO) 20 MG tablet Take 1 tablet (20 mg total) by mouth daily. 03/08/19   Bennie Pierini, FNP  estradiol (ESTRACE) 0.1 MG/GM vaginal cream PLACE 1 APPLICATORFUL VAGINALLY AT BEDTIME. 04/12/19   Daphine Deutscher, [name], FNP  fludrocortisone (FLORINEF) 0.1 MG tablet Take 1 tablet (0.1 mg total) by mouth daily. 02/03/19   Dina Rich  F, MD  fludrocortisone (FLORINEF) 0.1 MG tablet Take 1 tablet (0.1 mg total) by mouth every other day. 03/17/19   Antoine Poche, MD  fluticasone (FLONASE) 50 MCG/ACT nasal spray SPRAY 2 SPRAYS IN EACH NOSTRIL ONCE DAILY. 04/12/19   Daphine Deutscher, [name], FNP  fluticasone furoate-vilanterol (BREO ELLIPTA) 200-25 MCG/INH AEPB Inhale 1 puff into the lungs daily. 03/08/19   Daphine Deutscher [name], FNP  furosemide (LASIX) 40 MG tablet Take 1 tablet (40 mg total) by mouth daily as needed for fluid. 03/08/19   Daphine Deutscher [name], FNP  HYDROcodone-acetaminophen (NORCO) 7.5-325 MG tablet Take 1 tablet by mouth 2 (two) times daily. 05/09/19 06/08/19  Daphine Deutscher [name], FNP  HYDROcodone-acetaminophen (NORCO) 7.5-325 MG tablet Take 1 tablet by mouth every 6 (six) hours as needed for moderate pain. 04/09/19 05/09/19  Daphine Deutscher [name], FNP  HYDROcodone-acetaminophen (NORCO) 7.5-325 MG tablet Take 1 tablet by mouth every 6 (six) hours as needed for moderate pain. 03/10/19   Daphine Deutscher, [name], FNP  ipratropium (ATROVENT) 0.02 % nebulizer solution Take 2.5 mLs (0.5 mg total) by nebulization every 4 (four) hours as needed for wheezing. 03/08/19   Daphine Deutscher, [name], FNP  isosorbide mononitrate (IMDUR) 30 MG 24 hr  tablet Take 1 tablet (30 mg total) by mouth daily. 03/08/19   Daphine Deutscher, [name], FNP  lamoTRIgine (LAMICTAL) 150 MG tablet TAKE 1 TABLET BY MOUTH ONCE DAILY. Patient taking differently: Take 150 mg by mouth daily.  11/12/18   Daphine Deutscher [name], FNP  Lancets [facility] DELICA PLUS LANCET33G) MISC USE TO CHECK SUGAR DAILY. 03/09/19   Daphine Deutscher, [name], FNP  levETIRAcetam (KEPPRA) 500 MG tablet TAKE (1) TABLET TWICE DAILY. 04/12/19   Daphine Deutscher, [name], FNP  levocetirizine (XYZAL) 5 MG tablet TAKE 1 TABLET BY MOUTH EVERY MORNING. Patient taking differently: Take 5 mg by mouth daily.  12/09/18   Daphine Deutscher, [name], FNP  lidocaine (XYLOCAINE) 5 % ointment APPLY AFFECTED AREA THREE TIMES DAILY AS NEEDED FOR MILD OR MODERATE PAIN. 04/12/19   Daphine Deutscher, [name], FNP  nitroGLYCERIN (NITROSTAT) 0.4 MG SL tablet PLACE ONE (1) TABLET UNDER TONGUE EVERY 5 MINUTES UP TO (3) DOSES AS NEEDED FOR CHEST PAIN. 04/12/19   Daphine Deutscher, [name], FNP  ondansetron (ZOFRAN) 4 MG tablet TAKE 1 TABLET BY MOUTH EVERY 8 HOURS AS NEEDED FOR NAUSEA AND VOMITING. 03/08/19   Daphine Deutscher, [name], FNP  ONETOUCH VERIO test strip USE TO TEST BLOOD SUGAR ONCE A DAY. 03/09/19   Daphine Deutscher, [name], FNP  oxybutynin (DITROPAN) 5 MG tablet Take 1 tablet (5 mg total) by mouth 2 (two) times daily. 03/08/19   Daphine Deutscher, [name], FNP  rosuvastatin (CRESTOR) 10 MG tablet Take 1 tablet (10 mg total) by mouth daily. 03/08/19   Daphine Deutscher, [name], FNP  sucralfate (CARAFATE) 1 g tablet TAKE 1 TABLET 4 TIMES DAILY - WITH MEALS AND AT BEDTIME 04/12/19   Daphine Deutscher, [name], FNP  tiZANidine (ZANAFLEX) 4 MG tablet TAKE (1) TABLET EVERY SIX HOURS AS NEEDED FOR MUSCLE SPASMS. 04/12/19   Daphine Deutscher [name], FNP  traZODone (DESYREL) 150 MG tablet Take 1 tablet (150 mg total) by mouth at bedtime. 03/08/19   Daphine Deutscher, [name], FNP  TRELEGY ELLIPTA 100-62.5-25 MCG/INH AEPB INHALE 1 PUFF INTO LUNGS DAILY. 04/12/19   Daphine Deutscher,  [name], FNP  valACYclovir (VALTREX) 500 MG tablet Take 1 tablet (500 mg total) by mouth 2 (two) times daily. 03/08/19   Bennie Pierini, FNP    Physical Exam: Vitals:   04/14/19 1659 04/14/19 1701 04/14/19 1900  BP: (!) 155/82  133/66  Pulse: (!) 101  95  Resp: 18  Temp: (!) 100.6 F (38.1 C)    TempSrc: Oral    SpO2: 97%  99%  Weight:  68 kg   Height:   (1.575 m)     Constitutional: NAD, calm  Eyes: PERTLA, lids and conjunctivae normal ENMT: Mucous membranes are moist. Posterior pharynx clear of any exudate or lesions.   Neck: normal, supple, no masses, no thyromegaly Respiratory: no wheezing, no crackles. Normal respiratory effort. No accessory muscle use.  Cardiovascular: S1 & S2 heard, regular rate and rhythm. No extremity edema.   Abdomen: No distension, generally tender, most notably in lower quadrants . Bowel sounds active.  Musculoskeletal: no clubbing / cyanosis. No joint deformity upper and lower extremities.   Skin: no significant rashes, lesions, ulcers. Warm, dry, well-perfused. Neurologic: No facial asymmetry. Sensation intact. Moving all extremities.  Psychiatric: Alert and oriented. Pleasant, cooperative.     Labs on Admission: I have personally reviewed following labs and imaging studies  CBC: Recent Labs  Lab 04/14/19 1747  WBC 17.4*  HGB 12.2  HCT 41.0  MCV 84.7  PLT 299   Basic Metabolic Panel: Recent Labs  Lab 04/14/19 1747  NA 137  K 4.0  CL 101  CO2 24  GLUCOSE 108*  BUN 8  CREATININE 0.66  CALCIUM 9.5   GFR: Estimated Creatinine Clearance: 66 mL/min (by C-G formula based on SCr of 0.66 mg/dL). Liver Function Tests: Recent Labs  Lab 04/14/19 1747  AST 23  ALT 16  ALKPHOS 135*  BILITOT 0.6  PROT 8.2*  ALBUMIN 3.8   Recent Labs  Lab 04/14/19 1747  LIPASE 15   No results for input(s): AMMONIA in the last 168 hours. Coagulation Profile: No results for input(s): INR, PROTIME in the last 168  hours. Cardiac Enzymes: No results for input(s): CKTOTAL, CKMB, CKMBINDEX, TROPONINI in the last 168 hours. BNP (last 3 results) No results for input(s): PROBNP in the last 8760 hours. HbA1C: No results for input(s): HGBA1C in the last 72 hours. CBG: No results for input(s): GLUCAP in the last 168 hours. Lipid Profile: No results for input(s): CHOL, HDL, LDLCALC, TRIG, CHOLHDL, LDLDIRECT in the last 72 hours. Thyroid Function Tests: No results for input(s): TSH, T4TOTAL, FREET4, T3FREE, THYROIDAB in the last 72 hours. Anemia Panel: No results for input(s): VITAMINB12, FOLATE, FERRITIN, TIBC, IRON, RETICCTPCT in the last 72 hours. Urine analysis:    Component Value Date/Time   COLORURINE YELLOW 12/12/2016 1516   APPEARANCEUR Clear 12/16/2018 1626   LABSPEC 1.014 12/12/2016 1516   PHURINE 8.0 12/12/2016 1516   GLUCOSEU Negative 12/16/2018 1626   HGBUR NEGATIVE 12/12/2016 1516   BILIRUBINUR Negative 12/16/2018 1626   KETONESUR NEGATIVE 12/12/2016 1516   PROTEINUR 1+ (A) 12/16/2018 1626   PROTEINUR NEGATIVE 12/12/2016 1516   UROBILINOGEN negative 07/27/2013 1133   UROBILINOGEN 0.2 06/12/2008 1402   NITRITE Negative 12/16/2018 1626   NITRITE NEGATIVE 12/12/2016 1516   LEUKOCYTESUR 2+ (A) 12/16/2018 1626   Sepsis Labs: (procalcitonin:4,lacticidven:4) ) Recent Results (from the past 240 hour(s))  Respiratory Panel by RT PCR (Flu A&B, Covid) - Nasopharyngeal Swab     Status: None   Collection Time: 04/14/19  6:28 PM   Specimen: Nasopharyngeal Swab  Result Value Ref Range Status   SARS Coronavirus 2 by RT PCR NEGATIVE NEGATIVE Final    Comment: (NOTE) SARS-CoV-2 target nucleic acids are NOT DETECTED. The SARS-CoV-2 RNA is generally detectable in upper respiratoy specimens during the acute phase of infection. The lowest concentration of SARS-CoV-2 viral  copies this assay can detect is 131 copies/mL. A negative result does not preclude SARS-Cov-2 infection and should  not be used as the sole basis for treatment or other patient management decisions. A negative result may occur with  improper specimen collection/handling, submission of specimen other than nasopharyngeal swab, presence of viral mutation(s) within the areas targeted by this assay, and inadequate number of viral copies (<131 copies/mL). A negative result must be combined with clinical observations, patient history, and epidemiological information. The expected result is Negative. Fact Sheet for Patients:  PinkCheek.be Fact Sheet for Healthcare Providers:  GravelBags.it This test is not yet ap proved or cleared by the [place] FDA and  has been authorized for detection and/or diagnosis of SARS-CoV-2 by FDA under an Emergency Use Authorization (EUA). This EUA will remain  in effect (meaning this test can be used) for the duration of the COVID-19 declaration under Section 564(b)(1) of the Act, 21 U.S.C. section 360bbb-3(b)(1), unless the authorization is terminated or revoked sooner.    Influenza A by PCR NEGATIVE NEGATIVE Final   Influenza B by PCR NEGATIVE NEGATIVE Final    Comment: (NOTE) The Xpert Xpress SARS-CoV-2/FLU/RSV assay is intended as an aid in  the diagnosis of influenza from Nasopharyngeal swab specimens and  should not be used as a sole basis for treatment. Nasal washings and  aspirates are unacceptable for Xpert Xpress SARS-CoV-2/FLU/RSV  testing. Fact Sheet for Patients: PinkCheek.be Fact Sheet for Healthcare Providers: GravelBags.it This test is not yet approved or cleared by the [place] FDA and  has been authorized for detection and/or diagnosis of SARS-CoV-2 by  FDA under an Emergency Use Authorization (EUA). This EUA will remain  in effect (meaning this test can be used) for the duration of the  Covid-19 declaration under Section 564(b)(1)  of the Act, 21  U.S.C. section 360bbb-3(b)(1), unless the authorization is  terminated or revoked. Performed at [facility], [address], [place], [place] [address]      Radiological Exams on Admission: CT ABDOMEN PELVIS W CONTRAST  Result Date: 04/14/2019 CLINICAL DATA:  Diffuse abdominal pain for the past 6 days. Nausea and vomiting 2 days ago. EXAM: CT ABDOMEN AND PELVIS WITH CONTRAST TECHNIQUE: Multidetector CT imaging of the abdomen and pelvis was performed using the standard protocol following bolus administration of intravenous contrast. CONTRAST:  153mL OMNIPAQUE IOHEXOL 300 MG/ML  SOLN COMPARISON:  12/12/2016. FINDINGS: Lower chest: Moderately large hiatal hernia. Increased linear atelectasis/scarring at both lung bases. Normal sized heart. Hepatobiliary: Mild diffuse low density of the liver relative to the spleen. Cholecystectomy clips. Stable post cholecystectomy biliary ductal dilatation. Pancreas: Mild diffuse pancreatic atrophy. Spleen: Normal in size without focal abnormality. Adrenals/Urinary Tract: Adrenal glands are unremarkable. Kidneys are normal, without renal calculi, focal lesion, or hydronephrosis. Bladder is unremarkable. Stomach/Bowel: Diffuse low to medium density wall thickening involving the distal sigmoid colon in an area of multiple diverticula. There is associated pericolonic soft tissue stranding. There is minimal adjacent free peritoneal fluid without abscess formation. Normal appearing appendix and small bowel. Vascular/Lymphatic: Minimal atheromatous aortic calcification. No aneurysm. No enlarged lymph nodes. Reproductive: Uterus and bilateral adnexa are unremarkable. Other: No abdominal wall hernia or abnormality. No abdominopelvic ascites. Musculoskeletal: Interbody and pedicle screw and rod fusion at the L4-5 level with normal alignment. Interval 30% T12 superior endplate compression deformity and Schmorl's node formation. This is healed with no acute fracture  lines. Associated mild bony retropulsion. Mild lumbar and lower thoracic spine degenerative changes. Left ischial bone  island. IMPRESSION: 1. Sigmoid colon diverticulitis without abscess. 2. Mild diffuse hepatic steatosis. 3. Moderately large hiatal hernia. Electronically Signed   By: Beckie Salts M.D.   On: 04/14/2019 19:46   DG Chest Port 1 View  Result Date: 04/14/2019 CLINICAL DATA:  Abdominal pain EXAM: PORTABLE CHEST 1 VIEW COMPARISON:  01/17/2019 FINDINGS: Small hiatal hernia. Subsegmental atelectasis in the lung bases. No effusions. Heart is normal size. No acute bony abnormality. IMPRESSION: Bibasilar atelectasis. Electronically Signed   By: Charlett Nose M.D.   On: 04/14/2019 18:49    EKG: Independently reviewed. Sinus rhythm, LVH.   Assessment/Plan   1. Acute sigmoid diverticulitis  - Presents with severe abdominal pain, found to be febrile and tachycardic with marked leukocytosis and evidence for acute diverticulitis on CT  - Blood cultures collected in ED and she was started on IVF, Rocephin, and Flagyl  - Continue current antibiotics, follow cultures and clinical course    2. Seizure disorder  - Patient confirms that Keppra and Lamictal doses have not changed recently, reports no seizures in >1 yr, will continue Keppra and Lamictal   3. Chronic diastolic CHF  - Appears compensated  - EF was preserved in October 2020  - Diuretic held and IVF started in setting of sepsis    4. Chronic pain  - Managing acute abdominal pain with parenteral analgesics for now    5. Depression, anxiety  - Plan to continue home medications pending pharmacy medication-reconciliation     DVT prophylaxis: Lovenox  Code Status: Full  Family Communication: Discussed with patient Consults called: None  Admission status: Inpatient. Patient has acute diverticulitis with fever, marked leukocytosis, and tachycardia. She will require IV antibiotics, fluid-resuscitation, close monitoring for  complications, and it is not reasonably expected that she can be stabilized for discharge within observation timeframe.     Briscoe Deutscher, MD [facility] Pager [phone]  If 7PM-7AM, please contact night-coverage [email or web address] Password [facility]  04/14/2019, 8:20 PM

## 2019-04-14 NOTE — ED Triage Notes (Signed)
Pt with abd pain for 6 days, LBM unknown.  PCP sent here for evaluation.  Pt states she has passed some mucus, nausea and emesis couple days ago.

## 2019-04-14 NOTE — ED Notes (Signed)
Pt to CT

## 2019-04-14 NOTE — ED Provider Notes (Signed)
[facility] EMERGENCY DEPARTMENT Provider Note   CSN: [id number] Arrival date & time: 04/14/19  1642     History Chief Complaint  Patient presents with  . Abdominal Pain    [name] is a 64 y.o. female.  HPI   This patient is a 64 year old female, she has a known history of rheumatoid arthritis, bipolar disorder, COPD, coronary disease, history of cholecystitis status post removal, history of SVT and possible seizures.  She presents to the hospital after having 3 or 4 days of abdominal pain, this started in the lower abdomen and then seem to generalize very quickly.  It is been associated with mucoid stools and extreme nausea.  Her abdominal pain has worsened and today became very severe prompting her visit to the emergency department.  She actually arrives after transfer from urgent care after being found to be febrile tachycardic and having diffuse severe abdominal pain.  The pain does not radiate, it is global, she cannot walk or change position without severe pain.  She does report a history of diverticulitis but states this feels different  Past Medical History:  Diagnosis Date  . Anginal pain (HCC)    last time   . Anxiety   . Arthritis    RHEUMATOID  . Asthma   . Bipolar 1 disorder (HCC)   . Cataracts, bilateral 07/2017  . COPD (chronic obstructive pulmonary disease) (HCC)   . Coronary artery disease    reported hx of "MI";  Echo 2009 with normal LVF;  Myoview 05/2011: no ischemia  . Depression   . Dyslipidemia   . Dysrhythmia    SVT  . Esophageal stricture   . Fibromyalgia   . GERD (gastroesophageal reflux disease)   . H/O hiatal hernia   . Head injury, unspecified   . Herpes simplex infection   . History of kidney stones   . Hyperlipidemia   . Hypertension   . Insomnia   . Myocardial infarction [facility])    age 79  . Osteoporosis   . Pneumonia    hx  . Seizures (HCC)    last 3 weeks ago- prescribed keppra -never took didnt like side effects read about  online  . Shortness of breath   . Sleep apnea    ? neg  . Spinal stenosis of lumbar region   . Spondylolisthesis   . Status post placement of implantable loop recorder   . Supraventricular tachycardia (HCC)   . Syncope and collapse    s/p ILR; no arhythmogenic cause identified  . UTI (lower urinary tract infection)     Patient Active Problem List   Diagnosis Date Noted  . Chest pain 01/17/2019  . Unstable angina (HCC) 10/20/2017  . Encounter for chronic pain management 09/23/2016  . Pre-diabetes 09/02/2016  . Recurrent major depressive disorder, in partial remission (HCC) 07/14/2016  . Seizures (HCC) 07/14/2016  . GAD (generalized anxiety disorder) 07/14/2016  . Insomnia 09/05/2014  . Allergic rhinitis 09/05/2014  . Cervical spondylosis without myelopathy 12/19/2013    Class: Chronic  . Neural foraminal stenosis of cervical spine 12/19/2013  . Cervical radiculitis 09/19/2013  . Lumbosacral spondylosis without myelopathy 11/16/2012  . Postlaminectomy syndrome, lumbar region 11/16/2012  . Herpes simplex virus (HSV) infection 10/31/2008  . Hyperlipidemia with target LDL less than 100 10/31/2008  . Essential hypertension 10/31/2008  . GERD 10/31/2008  . SPINAL STENOSIS OF LUMBAR REGION 10/31/2008  . Myalgia 10/31/2008  . Osteoporosis 10/31/2008  . SPONDYLOLISTHESIS 10/31/2008  . SYNCOPE 10/31/2008  .  Sleep apnea 10/31/2008    Past Surgical History:  Procedure Laterality Date  . BACK SURGERY    . BREAST SURGERY     lumpectomy  . CATARACT EXTRACTION W/PHACO Right 07/31/2017   Procedure: CATARACT EXTRACTION PHACO AND INTRAOCULAR LENS PLACEMENT (IOC);  Surgeon: Fabio Pierce, MD;  Location: [facility]S;  Service: Ophthalmology;  Laterality: Right;  CDE: 2.33  . CATARACT EXTRACTION W/PHACO Left 08/14/2017   Procedure: CATARACT EXTRACTION PHACO AND INTRAOCULAR LENS PLACEMENT (IOC);  Surgeon: Fabio Pierce, MD;  Location: [facility];  Service: Ophthalmology;  Laterality: Left;  CDE: 2.74   . CHOLECYSTECTOMY    . CYSTOSCOPY     stone  . DOPPLER ECHOCARDIOGRAPHY  2009  . head up tilt table testing  06/15/2007   [name]  . HEMORRHOID SURGERY    . insertion of implatable loop recorder  08/11/2007   [name]  . POSTERIOR CERVICAL FUSION/FORAMINOTOMY N/A 12/19/2013   Procedure: RIGHT C3-4.C4-5 AND C5-6 FORAMINOTOMIES;  Surgeon: Kerrin Champagne, MD;  Location: [facility] OR;  Service: Orthopedics;  Laterality: N/A;  . TUBAL LIGATION       OB History   No obstetric history on file.     Family History  Problem Relation Age of Onset  . Heart attack Father   . Cancer Father   . Mental illness Father   . Cancer Mother   . Mental illness Mother   . Heart attack Brother        stents  . Alcohol abuse Brother   . Heart disease Brother   . Drug abuse Brother   . Diabetes Brother   . Colon cancer Maternal Aunt   . Cirrhosis Brother   . Stomach cancer Neg Hx     Social History   Tobacco Use  . Smoking status: Never Smoker  . Smokeless tobacco: Never Used  Substance Use Topics  . Alcohol use: No    Alcohol/week: 0.0 standard drinks  . Drug use: No    Home Medications Prior to Admission medications   Medication Sig Start Date End Date Taking? Authorizing Provider  acyclovir ointment (ZOVIRAX) 5 % APPLY OINTMENT TO AFFECTED AREA EVERY 3 HOURS. 04/12/19   Daphine Deutscher, [name], FNP  albuterol (PROVENTIL) (2.5 MG/3ML) 0.083% nebulizer solution USE 1 VIAL IN NEBULIZER EVERY 4 HOURS AS NEEDED FOR WHEEZING. Patient taking differently: Take 2.5 mg by nebulization every 4 (four) hours as needed for wheezing.  05/03/18   Daphine Deutscher, [name], FNP  albuterol (VENTOLIN HFA) 108 (90 Base) MCG/ACT inhaler INHALE 2 PUFFS EVERY 6 HOURS AS NEEDED FOR SHORTNESS OF BREATH AND WHEEZING. 04/12/19   Daphine Deutscher [name], FNP  Alcohol Swabs (GLOBAL ALCOHOL PREP EASE) 70 % PADS USE 1 PAD DAILY WHEN CHECKING BLOOD SUGAR. Dx R73.03 04/12/19   Bennie Pierini, FNP  busPIRone (BUSPAR) 10 MG  tablet Take 1 tablet (10 mg total) by mouth 2 (two) times daily as needed (for mood/ as directed). 03/08/19   Daphine Deutscher, [name], FNP  butalbital-acetaminophen-caffeine (FIORICET) [phone] MG tablet Take 1 tablet by mouth every 6 (six) hours as needed for headache. 03/08/19   Daphine Deutscher, [name], FNP  carvedilol (COREG) 6.25 MG tablet Take 1.5 tablets (9.375 mg total) by mouth 2 (two) times daily. 02/03/19   Antoine Poche, MD  dexlansoprazole (DEXILANT) 60 MG capsule Take 1 capsule (60 mg total) by mouth daily. 03/08/19   Daphine Deutscher [name], FNP  DULoxetine (CYMBALTA) 60 MG capsule Take 1 capsule (60 mg total) by mouth at bedtime. 03/08/19   Daphine Deutscher, [name],  FNP  escitalopram (LEXAPRO) 20 MG tablet Take 1 tablet (20 mg total) by mouth daily. 03/08/19   Bennie Pierini, FNP  estradiol (ESTRACE) 0.1 MG/GM vaginal cream PLACE 1 APPLICATORFUL VAGINALLY AT BEDTIME. 04/12/19   Daphine Deutscher, [name], FNP  fludrocortisone (FLORINEF) 0.1 MG tablet Take 1 tablet (0.1 mg total) by mouth daily. 02/03/19   Antoine Poche, MD  fludrocortisone (FLORINEF) 0.1 MG tablet Take 1 tablet (0.1 mg total) by mouth every other day. 03/17/19   Antoine Poche, MD  fluticasone (FLONASE) 50 MCG/ACT nasal spray SPRAY 2 SPRAYS IN EACH NOSTRIL ONCE DAILY. 04/12/19   Daphine Deutscher, [name], FNP  fluticasone furoate-vilanterol (BREO ELLIPTA) 200-25 MCG/INH AEPB Inhale 1 puff into the lungs daily. 03/08/19   Daphine Deutscher [name], FNP  furosemide (LASIX) 40 MG tablet Take 1 tablet (40 mg total) by mouth daily as needed for fluid. 03/08/19   Daphine Deutscher [name], FNP  HYDROcodone-acetaminophen (NORCO) 7.5-325 MG tablet Take 1 tablet by mouth 2 (two) times daily. 05/09/19 06/08/19  Daphine Deutscher [name], FNP  HYDROcodone-acetaminophen (NORCO) 7.5-325 MG tablet Take 1 tablet by mouth every 6 (six) hours as needed for moderate pain. 04/09/19 05/09/19  Daphine Deutscher [name], FNP  HYDROcodone-acetaminophen (NORCO) 7.5-325 MG  tablet Take 1 tablet by mouth every 6 (six) hours as needed for moderate pain. 03/10/19   Daphine Deutscher, [name], FNP  ipratropium (ATROVENT) 0.02 % nebulizer solution Take 2.5 mLs (0.5 mg total) by nebulization every 4 (four) hours as needed for wheezing. 03/08/19   Daphine Deutscher, [name], FNP  isosorbide mononitrate (IMDUR) 30 MG 24 hr tablet Take 1 tablet (30 mg total) by mouth daily. 03/08/19   Daphine Deutscher, [name], FNP  lamoTRIgine (LAMICTAL) 150 MG tablet TAKE 1 TABLET BY MOUTH ONCE DAILY. Patient taking differently: Take 150 mg by mouth daily.  11/12/18   Daphine Deutscher [name], FNP  Lancets [facility] DELICA PLUS LANCET33G) MISC USE TO CHECK SUGAR DAILY. 03/09/19   Daphine Deutscher, [name], FNP  levETIRAcetam (KEPPRA) 500 MG tablet TAKE (1) TABLET TWICE DAILY. 04/12/19   Daphine Deutscher, [name], FNP  levocetirizine (XYZAL) 5 MG tablet TAKE 1 TABLET BY MOUTH EVERY MORNING. Patient taking differently: Take 5 mg by mouth daily.  12/09/18   Daphine Deutscher, [name], FNP  lidocaine (XYLOCAINE) 5 % ointment APPLY AFFECTED AREA THREE TIMES DAILY AS NEEDED FOR MILD OR MODERATE PAIN. 04/12/19   Daphine Deutscher, [name], FNP  nitroGLYCERIN (NITROSTAT) 0.4 MG SL tablet PLACE ONE (1) TABLET UNDER TONGUE EVERY 5 MINUTES UP TO (3) DOSES AS NEEDED FOR CHEST PAIN. 04/12/19   Daphine Deutscher, [name], FNP  ondansetron (ZOFRAN) 4 MG tablet TAKE 1 TABLET BY MOUTH EVERY 8 HOURS AS NEEDED FOR NAUSEA AND VOMITING. 03/08/19   Daphine Deutscher, [name], FNP  ONETOUCH VERIO test strip USE TO TEST BLOOD SUGAR ONCE A DAY. 03/09/19   Daphine Deutscher, [name], FNP  oxybutynin (DITROPAN) 5 MG tablet Take 1 tablet (5 mg total) by mouth 2 (two) times daily. 03/08/19   Daphine Deutscher, [name], FNP  rosuvastatin (CRESTOR) 10 MG tablet Take 1 tablet (10 mg total) by mouth daily. 03/08/19   Daphine Deutscher, [name], FNP  sucralfate (CARAFATE) 1 g tablet TAKE 1 TABLET 4 TIMES DAILY - WITH MEALS AND AT BEDTIME 04/12/19   Daphine Deutscher, [name], FNP  tiZANidine (ZANAFLEX)  4 MG tablet TAKE (1) TABLET EVERY SIX HOURS AS NEEDED FOR MUSCLE SPASMS. 04/12/19   Daphine Deutscher [name], FNP  traZODone (DESYREL) 150 MG tablet Take 1 tablet (150 mg total) by mouth at bedtime. 03/08/19   Daphine Deutscher, [name], FNP  TRELEGY ELLIPTA 100-62.5-25 MCG/INH AEPB INHALE 1 PUFF INTO  LUNGS DAILY. 04/12/19   Daphine Deutscher, [name], FNP  valACYclovir (VALTREX) 500 MG tablet Take 1 tablet (500 mg total) by mouth 2 (two) times daily. 03/08/19   Daphine Deutscher [name], FNP    Allergies    Codeine, Morphine and related, Ambien [zolpidem tartrate], Lyrica [pregabalin], and Neurontin [gabapentin]  Review of Systems   Review of Systems  All other systems reviewed and are negative.   Physical Exam Updated Vital Signs BP 133/66   Pulse 95   Temp (!) 100.6 F (38.1 C) (Oral)   Resp 18   Ht 1.575 m (5\' 2" )   Wt 68 kg   SpO2 99%   BMI 27.44 kg/m   Physical Exam Vitals and nursing note reviewed.  Constitutional:      General: She is in acute distress.     Appearance: She is well-developed. She is ill-appearing.  HENT:     Head: Normocephalic and atraumatic.     Mouth/Throat:     Pharynx: No oropharyngeal exudate.  Eyes:     General: No scleral icterus.       Right eye: No discharge.        Left eye: No discharge.     Conjunctiva/sclera: Conjunctivae normal.     Pupils: Pupils are equal, round, and reactive to light.  Neck:     Thyroid: No thyromegaly.     Vascular: No JVD.  Cardiovascular:     Rate and Rhythm: Regular rhythm. Tachycardia present.     Heart sounds: Normal heart sounds. No murmur. No friction rub. No gallop.   Pulmonary:     Effort: Pulmonary effort is normal. No respiratory distress.     Breath sounds: Normal breath sounds. No wheezing or rales.  Abdominal:     General: Bowel sounds are normal. There is no distension.     Palpations: Abdomen is soft. There is no mass.     Tenderness: There is abdominal tenderness. There is guarding and rebound.      Comments: Peritoneal signs found, there is cough pain, pain with tapping the heels and diffuse guarding  Musculoskeletal:        General: No swelling, tenderness, deformity or signs of injury. Normal range of motion.     Cervical back: Normal range of motion and neck supple.     Right lower leg: No edema.     Left lower leg: No edema.  Lymphadenopathy:     Cervical: No cervical adenopathy.  Skin:    General: Skin is warm and dry.     Findings: No erythema or rash.  Neurological:     Mental Status: She is alert.     Coordination: Coordination normal.  Psychiatric:        Behavior: Behavior normal.     ED Results / Procedures / Treatments   Labs (all labs ordered are listed, but only abnormal results are displayed) Labs Reviewed  COMPREHENSIVE METABOLIC PANEL - Abnormal; Notable for the following components:      Result Value   Glucose, Bld 108 (*)    Total Protein 8.2 (*)    Alkaline Phosphatase 135 (*)    All other components within normal limits  CBC - Abnormal; Notable for the following components:   WBC 17.4 (*)    MCH 25.2 (*)    MCHC 29.8 (*)    RDW 15.6 (*)    All other components within normal limits  RESPIRATORY PANEL BY RT PCR (FLU A&B, COVID)  CULTURE, BLOOD (ROUTINE X 2)  CULTURE, BLOOD (ROUTINE X 2)  URINE CULTURE  LIPASE, BLOOD  LACTIC ACID, PLASMA  URINALYSIS, ROUTINE W REFLEX MICROSCOPIC  TYPE AND SCREEN    EKG EKG Interpretation  Date/Time:  Thursday April 14 2019 19:46:48 EST Ventricular Rate:  90 PR Interval:    QRS Duration: 84 QT Interval:  383 QTC Calculation: 469 R Axis:   -32 Text Interpretation: Sinus rhythm Left ventricular hypertrophy since last tracing no significant change Confirmed by Noemi Chapel [phone]) on 04/14/2019 7:50:44 PM   Radiology CT ABDOMEN PELVIS W CONTRAST  Result Date: 04/14/2019 CLINICAL DATA:  Diffuse abdominal pain for the past 6 days. Nausea and vomiting 2 days ago. EXAM: CT ABDOMEN AND PELVIS WITH CONTRAST  TECHNIQUE: Multidetector CT imaging of the abdomen and pelvis was performed using the standard protocol following bolus administration of intravenous contrast. CONTRAST:  13mL OMNIPAQUE IOHEXOL 300 MG/ML  SOLN COMPARISON:  12/12/2016. FINDINGS: Lower chest: Moderately large hiatal hernia. Increased linear atelectasis/scarring at both lung bases. Normal sized heart. Hepatobiliary: Mild diffuse low density of the liver relative to the spleen. Cholecystectomy clips. Stable post cholecystectomy biliary ductal dilatation. Pancreas: Mild diffuse pancreatic atrophy. Spleen: Normal in size without focal abnormality. Adrenals/Urinary Tract: Adrenal glands are unremarkable. Kidneys are normal, without renal calculi, focal lesion, or hydronephrosis. Bladder is unremarkable. Stomach/Bowel: Diffuse low to medium density wall thickening involving the distal sigmoid colon in an area of multiple diverticula. There is associated pericolonic soft tissue stranding. There is minimal adjacent free peritoneal fluid without abscess formation. Normal appearing appendix and small bowel. Vascular/Lymphatic: Minimal atheromatous aortic calcification. No aneurysm. No enlarged lymph nodes. Reproductive: Uterus and bilateral adnexa are unremarkable. Other: No abdominal wall hernia or abnormality. No abdominopelvic ascites. Musculoskeletal: Interbody and pedicle screw and rod fusion at the L4-5 level with normal alignment. Interval 30% T12 superior endplate compression deformity and Schmorl's node formation. This is healed with no acute fracture lines. Associated mild bony retropulsion. Mild lumbar and lower thoracic spine degenerative changes. Left ischial bone island. IMPRESSION: 1. Sigmoid colon diverticulitis without abscess. 2. Mild diffuse hepatic steatosis. 3. Moderately large hiatal hernia. Electronically Signed   By: Claudie Revering M.D.   On: 04/14/2019 19:46   DG Chest Port 1 View  Result Date: 04/14/2019 CLINICAL DATA:  Abdominal  pain EXAM: PORTABLE CHEST 1 VIEW COMPARISON:  01/17/2019 FINDINGS: Small hiatal hernia. Subsegmental atelectasis in the lung bases. No effusions. Heart is normal size. No acute bony abnormality. IMPRESSION: Bibasilar atelectasis. Electronically Signed   By: Rolm Baptise M.D.   On: 04/14/2019 18:49    Procedures Ultrasound ED Peripheral IV (Provider)  Date/Time: 04/14/2019 7:02 PM Performed by: Noemi Chapel, MD Authorized by: Noemi Chapel, MD   Procedure details:    Indications: poor IV access     Skin Prep: chlorhexidine gluconate     Location: R wrist.   Angiocath:  20 G   Bedside Ultrasound Guided: Yes     Images: archived     Patient tolerated procedure without complications: Yes     Dressing applied: Yes   Comments:         (including critical care time)  Medications Ordered in ED Medications  sodium chloride flush (NS) 0.9 % injection 3 mL (has no administration in time range)  ketorolac (TORADOL) 30 MG/ML injection 30 mg (has no administration in time range)  0.9 %  sodium chloride infusion (has no administration in time range)  cefTRIAXone (ROCEPHIN) 2 g in sodium chloride 0.9 % 100 mL IVPB (has  no administration in time range)  metroNIDAZOLE (FLAGYL) IVPB 500 mg (has no administration in time range)  HYDROmorphone (DILAUDID) injection 1 mg (1 mg Intravenous Given 04/14/19 1840)  iohexol (OMNIPAQUE) 300 MG/ML solution 100 mL (100 mLs Intravenous Contrast Given 04/14/19 1922)    ED Course  I have reviewed the triage vital signs and the nursing notes.  Pertinent labs & imaging results that were available during my care of the patient were reviewed by me and considered in my medical decision making (see chart for details).    MDM Rules/Calculators/A&P                      This patient has poor IV access, I was asked to assist with IV, she has a white blood cell count of over 17,000 with a fever and tachycardia all concerning for sepsis.  This may be from an  intra-abdominal source I was concerned for perforation thus was at the patient in the upright position and obtained an upright x-ray.  I do not see any obvious free air.  The patient will need to go for CT scan of the abdomen and pelvis, she will be given antibiotics to cover for possible intra-abdominal source.  Dilaudid and IV fluids have been ordered.  Discussed with Dr. Antionette Char who will admit  ELLIOTTE MARSALIS was evaluated in Emergency Department on 04/14/2019 for the symptoms described in the history of present illness. She was evaluated in the context of the global COVID-19 pandemic, which necessitated consideration that the patient might be at risk for infection with the SARS-CoV-2 virus that causes COVID-19. Institutional protocols and algorithms that pertain to the evaluation of patients at risk for COVID-19 are in a state of rapid change based on information released by regulatory bodies including the CDC and federal and state organizations. These policies and algorithms were followed during the patient's care in the ED.   Final Clinical Impression(s) / ED Diagnoses Final diagnoses:  Acute diverticulitis      Eber Hong, MD 04/14/19 2002

## 2019-04-15 ENCOUNTER — Other Ambulatory Visit: Payer: Self-pay

## 2019-04-15 ENCOUNTER — Encounter (HOSPITAL_COMMUNITY): Payer: Self-pay | Admitting: Family Medicine

## 2019-04-15 ENCOUNTER — Ambulatory Visit: Payer: 59 | Admitting: Nurse Practitioner

## 2019-04-15 DIAGNOSIS — E663 Overweight: Secondary | ICD-10-CM | POA: Diagnosis present

## 2019-04-15 DIAGNOSIS — I10 Essential (primary) hypertension: Secondary | ICD-10-CM

## 2019-04-15 LAB — CBC WITH DIFFERENTIAL/PLATELET
Abs Immature Granulocytes: 0.05 10*3/uL (ref 0.00–0.07)
Basophils Absolute: 0 10*3/uL (ref 0.0–0.1)
Basophils Relative: 0 %
Eosinophils Absolute: 0.1 10*3/uL (ref 0.0–0.5)
Eosinophils Relative: 1 %
HCT: 32.5 % — ABNORMAL LOW (ref 36.0–46.0)
Hemoglobin: 9.4 g/dL — ABNORMAL LOW (ref 12.0–15.0)
Immature Granulocytes: 1 %
Lymphocytes Relative: 21 %
Lymphs Abs: 2.2 10*3/uL (ref 0.7–4.0)
MCH: 25.1 pg — ABNORMAL LOW (ref 26.0–34.0)
MCHC: 28.9 g/dL — ABNORMAL LOW (ref 30.0–36.0)
MCV: 86.7 fL (ref 80.0–100.0)
Monocytes Absolute: 1.2 10*3/uL — ABNORMAL HIGH (ref 0.1–1.0)
Monocytes Relative: 12 %
Neutro Abs: 6.9 10*3/uL (ref 1.7–7.7)
Neutrophils Relative %: 65 %
Platelets: 189 10*3/uL (ref 150–400)
RBC: 3.75 MIL/uL — ABNORMAL LOW (ref 3.87–5.11)
RDW: 15.9 % — ABNORMAL HIGH (ref 11.5–15.5)
WBC: 10.4 10*3/uL (ref 4.0–10.5)
nRBC: 0 % (ref 0.0–0.2)

## 2019-04-15 LAB — BASIC METABOLIC PANEL
Anion gap: 9 (ref 5–15)
BUN: 9 mg/dL (ref 8–23)
CO2: 25 mmol/L (ref 22–32)
Calcium: 8.4 mg/dL — ABNORMAL LOW (ref 8.9–10.3)
Chloride: 106 mmol/L (ref 98–111)
Creatinine, Ser: 0.67 mg/dL (ref 0.44–1.00)
GFR calc Af Amer: >60 mL/min (ref >60)
GFR calc non Af Amer: >60 mL/min (ref >60)
Glucose, Bld: 91 mg/dL (ref 70–99)
Potassium: 3.9 mmol/L (ref 3.5–5.1)
Sodium: 140 mmol/L (ref 135–145)

## 2019-04-15 LAB — BRAIN NATRIURETIC PEPTIDE: B Natriuretic Peptide: 21 pg/mL (ref 0.0–100.0)

## 2019-04-15 MED ORDER — SODIUM CHLORIDE 0.9 % IV SOLN
1000.0000 mL | INTRAVENOUS | Status: AC
  Administered 2019-04-15 (×2): 1000 mL via INTRAVENOUS

## 2019-04-15 MED ORDER — METRONIDAZOLE IN NACL 5-0.79 MG/ML-% IV SOLN
500.0000 mg | Freq: Three times a day (TID) | INTRAVENOUS | Status: DC
  Administered 2019-04-15 – 2019-04-17 (×7): 500 mg via INTRAVENOUS
  Filled 2019-04-15 (×7): qty 100

## 2019-04-15 MED ORDER — ACETAMINOPHEN 650 MG RE SUPP: 650.0000 mg | Freq: Four times a day (QID) | RECTAL | Status: DC | PRN

## 2019-04-15 MED ORDER — ACETAMINOPHEN 325 MG PO TABS: 650.0000 mg | ORAL_TABLET | Freq: Four times a day (QID) | ORAL | Status: DC | PRN

## 2019-04-15 MED ORDER — SODIUM CHLORIDE 0.9 % IV SOLN
2.0000 g | INTRAVENOUS | Status: DC
  Administered 2019-04-15 – 2019-04-17 (×2): 2 g via INTRAVENOUS
  Filled 2019-04-15 (×2): qty 20

## 2019-04-15 MED ORDER — ENOXAPARIN SODIUM 40 MG/0.4ML ~~LOC~~ SOLN
40.0000 mg | SUBCUTANEOUS | Status: DC
  Administered 2019-04-15 – 2019-04-17 (×3): 40 mg via SUBCUTANEOUS
  Filled 2019-04-15 (×3): qty 0.4

## 2019-04-15 MED ORDER — HYDROMORPHONE HCL 1 MG/ML IJ SOLN
0.5000 mg | INTRAMUSCULAR | Status: DC | PRN
  Administered 2019-04-15 – 2019-04-16 (×3): 1 mg via INTRAVENOUS
  Filled 2019-04-15 (×3): qty 1

## 2019-04-15 MED ORDER — FLUCONAZOLE 150 MG PO TABS
150.0000 mg | ORAL_TABLET | Freq: Once | ORAL | Status: AC
  Administered 2019-04-15: 18:00:00 150 mg via ORAL
  Filled 2019-04-15: qty 1

## 2019-04-15 NOTE — Care Management Important Message (Signed)
Important Message  Patient Details  Name: [name] MRN: [id number] Date of Birth: 03/21/1956   Medicare Important Message Given:  Yes     Corey Harold 04/15/2019, 12:25 PM

## 2019-04-15 NOTE — Progress Notes (Signed)
PROGRESS NOTE  [name] UDJ:[id number] DOB: Jul 30, 1955 DOA: 04/14/2019 PCP: Bennie Pierini, FNP  HPI/Recap of past 31 hours: 64 year old female with past medical history of seizure disorder, bipolar disorder, chronic diastolic CHF and COPD plus previous diverticulosis admitted on 1/14 for several days of nausea vomiting and left lower quadrant abdominal pain and found to have acute diverticulitis with some mild free abdominal fluid, but no abscess.  White count elevated at 17,000, but no evidence of sepsis.  Started on IV antibiotics and admitted to the hospitalist service.  This following morning, patient still having moderate left lower quadrant abdominal pain.  No nausea.  White count normalized.  No other complaints.  Assessment/Plan: Principal Problem:   Sigmoid diverticulitis: Continue IV antibiotics and as pain decreases, will start to advance p.o. diet. Active Problems:   Essential hypertension: Stable, continue home medications.    Recurrent major depressive disorder, in partial remission (HCC): Stable.  Continue home medications.    Seizures (HCC): Stable, continue Keppra.    GAD (generalized anxiety disorder): Stable at this time    Chronic pain: As needed IV pain medication.    Chronic diastolic CHF (congestive heart failure) (HCC): Stable.  Noted patient had listing of a decreased oxygen saturation on room air although she herself reports no shortness of breath.  BMP normal.    Overweight (BMI 25.0-29.9): Meets criteria BMI greater than 25.  Code Status: Full code  Family Communication: Left message for daughter  Disposition Plan: Home once able to tolerate solid food    Consultants:  none   Procedures:  None   Antimicrobials:  IV Rocephin & Flagyl 1/14-present   DVT prophylaxis: Lovenox   Objective: Vitals:   04/15/19 0146 04/15/19 0458  BP: (!) 141/64 (!) 123/53  Pulse: 81 83  Resp: 20 18  Temp: 98.8 F (37.1 C) 98.8 F (37.1  C)  SpO2: 95% (!) 89%    Intake/Output Summary (Last 24 hours) at 04/15/2019 1118 Last data filed at 04/15/2019 0900 Gross per 24 hour  Intake 1424.44 ml  Output -  Net 1424.44 ml   Filed Weights   04/14/19 1701  Weight: 68 kg   Body mass index is 27.44 kg/m.  Exam:   General: Alert and oriented x3, mild distress secondary to abdominal pain  Cardiovascular: Regular rate and rhythm, S1-S2  Respiratory: Clear to auscultation bilaterally  Abdomen: Soft moderate tenderness in the left lower quadrant, nontender, normoactive bowel sounds  Musculoskeletal: No clubbing or cyanosis or edema  Skin: No skin breaks, tears or lesions  Psychiatry: Appropriate, no evidence of psychoses   Data Reviewed: CBC: Recent Labs  Lab 04/14/19 1747 04/15/19 0527  WBC 17.4* 10.4  NEUTROABS  --  6.9  HGB 12.2 9.4*  HCT 41.0 32.5*  MCV 84.7 86.7  PLT 299 189   Basic Metabolic Panel: Recent Labs  Lab 04/14/19 1747 04/15/19 0527  NA 137 140  K 4.0 3.9  CL 101 106  CO2 24 25  GLUCOSE 108* 91  BUN 8 9  CREATININE 0.66 0.67  CALCIUM 9.5 8.4*   GFR: Estimated Creatinine Clearance: 66 mL/min (by C-G formula based on SCr of 0.67 mg/dL). Liver Function Tests: Recent Labs  Lab 04/14/19 1747  AST 23  ALT 16  ALKPHOS 135*  BILITOT 0.6  PROT 8.2*  ALBUMIN 3.8   Recent Labs  Lab 04/14/19 1747  LIPASE 15   No results for input(s): AMMONIA in the last 168 hours. Coagulation Profile: No results for  input(s): INR, PROTIME in the last 168 hours. Cardiac Enzymes: No results for input(s): CKTOTAL, CKMB, CKMBINDEX, TROPONINI in the last 168 hours. BNP (last 3 results) No results for input(s): PROBNP in the last 8760 hours. HbA1C: No results for input(s): HGBA1C in the last 72 hours. CBG: No results for input(s): GLUCAP in the last 168 hours. Lipid Profile: No results for input(s): CHOL, HDL, LDLCALC, TRIG, CHOLHDL, LDLDIRECT in the last 72 hours. Thyroid Function Tests: No  results for input(s): TSH, T4TOTAL, FREET4, T3FREE, THYROIDAB in the last 72 hours. Anemia Panel: No results for input(s): VITAMINB12, FOLATE, FERRITIN, TIBC, IRON, RETICCTPCT in the last 72 hours. Urine analysis:    Component Value Date/Time   COLORURINE YELLOW 04/14/2019 2248   APPEARANCEUR CLEAR 04/14/2019 2248   APPEARANCEUR Clear 12/16/2018 1626   LABSPEC >1.046 (H) 04/14/2019 2248   PHURINE 5.0 04/14/2019 2248   GLUCOSEU NEGATIVE 04/14/2019 2248   HGBUR NEGATIVE 04/14/2019 2248   BILIRUBINUR NEGATIVE 04/14/2019 2248   BILIRUBINUR Negative 12/16/2018 1626   KETONESUR 5 (A) 04/14/2019 2248   PROTEINUR NEGATIVE 04/14/2019 2248   UROBILINOGEN negative 07/27/2013 1133   UROBILINOGEN 0.2 06/12/2008 1402   NITRITE NEGATIVE 04/14/2019 2248   LEUKOCYTESUR NEGATIVE 04/14/2019 2248   Sepsis Labs: @LABRCNTIP (procalcitonin:4,lacticidven:4)  ) Recent Results (from the past 240 hour(s))  Respiratory Panel by RT PCR (Flu A&B, Covid) - Nasopharyngeal Swab     Status: None   Collection Time: 04/14/19  6:28 PM   Specimen: Nasopharyngeal Swab  Result Value Ref Range Status   SARS Coronavirus 2 by RT PCR NEGATIVE NEGATIVE Final    Comment: (NOTE) SARS-CoV-2 target nucleic acids are NOT DETECTED. The SARS-CoV-2 RNA is generally detectable in upper respiratoy specimens during the acute phase of infection. The lowest concentration of SARS-CoV-2 viral copies this assay can detect is 131 copies/mL. A negative result does not preclude SARS-Cov-2 infection and should not be used as the sole basis for treatment or other patient management decisions. A negative result may occur with  improper specimen collection/handling, submission of specimen other than nasopharyngeal swab, presence of viral mutation(s) within the areas targeted by this assay, and inadequate number of viral copies (<131 copies/mL). A negative result must be combined with clinical observations, patient history, and  epidemiological information. The expected result is Negative. Fact Sheet for Patients:  04/16/19 Fact Sheet for Healthcare Providers:  https://www.moore.com/ This test is not yet ap proved or cleared by the https://www.young.biz/ FDA and  has been authorized for detection and/or diagnosis of SARS-CoV-2 by FDA under an Emergency Use Authorization (EUA). This EUA will remain  in effect (meaning this test can be used) for the duration of the COVID-19 declaration under Section 564(b)(1) of the Act, 21 U.S.C. section 360bbb-3(b)(1), unless the authorization is terminated or revoked sooner.    Influenza A by PCR NEGATIVE NEGATIVE Final   Influenza B by PCR NEGATIVE NEGATIVE Final    Comment: (NOTE) The Xpert Xpress SARS-CoV-2/FLU/RSV assay is intended as an aid in  the diagnosis of influenza from Nasopharyngeal swab specimens and  should not be used as a sole basis for treatment. Nasal washings and  aspirates are unacceptable for Xpert Xpress SARS-CoV-2/FLU/RSV  testing. Fact Sheet for Patients: [place] Fact Sheet for Healthcare Providers: https://www.moore.com/ This test is not yet approved or cleared by the https://www.young.biz/ FDA and  has been authorized for detection and/or diagnosis of SARS-CoV-2 by  FDA under an Emergency Use Authorization (EUA). This EUA will remain  in effect (meaning  this test can be used) for the duration of the  Covid-19 declaration under Section 564(b)(1) of the Act, 21  U.S.C. section 360bbb-3(b)(1), unless the authorization is  terminated or revoked. Performed at [facility], [address] [place], [place] [address]   Blood Culture (routine x 2)     Status: None (Preliminary result)   Collection Time: 04/14/19  7:17 PM   Specimen: BLOOD LEFT FOREARM  Result Value Ref Range Status   Specimen Description BLOOD LEFT FOREARM  Final   Special Requests   Final     BOTTLES DRAWN AEROBIC AND ANAEROBIC Blood Culture adequate volume   Culture   Final    NO GROWTH < 12 HOURS Performed at [facility], [address], [place], [place] [address]    Report Status PENDING  Incomplete  Blood Culture (routine x 2)     Status: None (Preliminary result)   Collection Time: 04/14/19  7:17 PM   Specimen: BLOOD LEFT HAND  Result Value Ref Range Status   Specimen Description BLOOD LEFT HAND  Final   Special Requests   Final    BOTTLES DRAWN AEROBIC AND ANAEROBIC Blood Culture adequate volume   Culture   Final    NO GROWTH < 12 HOURS Performed at [facility], [address], [place],  [address]    Report Status PENDING  Incomplete      Studies: CT ABDOMEN PELVIS W CONTRAST  Result Date: 04/14/2019 CLINICAL DATA:  Diffuse abdominal pain for the past 6 days. Nausea and vomiting 2 days ago. EXAM: CT ABDOMEN AND PELVIS WITH CONTRAST TECHNIQUE: Multidetector CT imaging of the abdomen and pelvis was performed using the standard protocol following bolus administration of intravenous contrast. CONTRAST:  134mL OMNIPAQUE IOHEXOL 300 MG/ML  SOLN COMPARISON:  12/12/2016. FINDINGS: Lower chest: Moderately large hiatal hernia. Increased linear atelectasis/scarring at both lung bases. Normal sized heart. Hepatobiliary: Mild diffuse low density of the liver relative to the spleen. Cholecystectomy clips. Stable post cholecystectomy biliary ductal dilatation. Pancreas: Mild diffuse pancreatic atrophy. Spleen: Normal in size without focal abnormality. Adrenals/Urinary Tract: Adrenal glands are unremarkable. Kidneys are normal, without renal calculi, focal lesion, or hydronephrosis. Bladder is unremarkable. Stomach/Bowel: Diffuse low to medium density wall thickening involving the distal sigmoid colon in an area of multiple diverticula. There is associated pericolonic soft tissue stranding. There is minimal adjacent free peritoneal fluid without abscess formation. Normal appearing  appendix and small bowel. Vascular/Lymphatic: Minimal atheromatous aortic calcification. No aneurysm. No enlarged lymph nodes. Reproductive: Uterus and bilateral adnexa are unremarkable. Other: No abdominal wall hernia or abnormality. No abdominopelvic ascites. Musculoskeletal: Interbody and pedicle screw and rod fusion at the L4-5 level with normal alignment. Interval 30% T12 superior endplate compression deformity and Schmorl's node formation. This is healed with no acute fracture lines. Associated mild bony retropulsion. Mild lumbar and lower thoracic spine degenerative changes. Left ischial bone island. IMPRESSION: 1. Sigmoid colon diverticulitis without abscess. 2. Mild diffuse hepatic steatosis. 3. Moderately large hiatal hernia. Electronically Signed   By: Claudie Revering M.D.   On: 04/14/2019 19:46   DG Chest Port 1 View  Result Date: 04/14/2019 CLINICAL DATA:  Abdominal pain EXAM: PORTABLE CHEST 1 VIEW COMPARISON:  01/17/2019 FINDINGS: Small hiatal hernia. Subsegmental atelectasis in the lung bases. No effusions. Heart is normal size. No acute bony abnormality. IMPRESSION: Bibasilar atelectasis. Electronically Signed   By: Rolm Baptise M.D.   On: 04/14/2019 18:49    Scheduled Meds: . enoxaparin (LOVENOX) injection  40 mg  Subcutaneous Q24H  . lamoTRIgine  150 mg Oral Daily  . levETIRAcetam  500 mg Oral BID  . sodium chloride flush  3 mL Intravenous Once    Continuous Infusions: . sodium chloride 1,000 mL (04/15/19 0922)  . cefTRIAXone (ROCEPHIN)  IV    . metronidazole 500 mg (04/15/19 0420)     LOS: 1 day     Hollice Espy, MD [facility]  To reach me or the doctor on call, go to: [email or web address] Password [email or web address]  04/15/2019, 11:18 AM

## 2019-04-15 NOTE — Progress Notes (Signed)
Has had several small stools which have been mostly mucus with one stool mucus and blood mixed with mucus.  C/O pain and felt like had yeast infection.  Received order for diflucan and increase in frequency of dilaudid.  Has been ambulating to bathroom.

## 2019-04-16 LAB — CBC
HCT: 36 % (ref 36.0–46.0)
Hemoglobin: 10.8 g/dL — ABNORMAL LOW (ref 12.0–15.0)
MCH: 25.4 pg — ABNORMAL LOW (ref 26.0–34.0)
MCHC: 30 g/dL (ref 30.0–36.0)
MCV: 84.5 fL (ref 80.0–100.0)
Platelets: 296 10*3/uL (ref 150–400)
RBC: 4.26 MIL/uL (ref 3.87–5.11)
RDW: 15.7 % — ABNORMAL HIGH (ref 11.5–15.5)
WBC: 10.7 10*3/uL — ABNORMAL HIGH (ref 4.0–10.5)
nRBC: 0 % (ref 0.0–0.2)

## 2019-04-16 LAB — URINE CULTURE: Culture: <10000 — AB

## 2019-04-16 MED ORDER — CARVEDILOL 3.125 MG PO TABS
6.2500 mg | ORAL_TABLET | Freq: Two times a day (BID) | ORAL | Status: DC
  Administered 2019-04-16: 6.25 mg via ORAL
  Administered 2019-04-17: 09:00:00 3.125 mg via ORAL
  Filled 2019-04-16 (×2): qty 2

## 2019-04-16 MED ORDER — ISOSORBIDE MONONITRATE ER 60 MG PO TB24
30.0000 mg | ORAL_TABLET | Freq: Every day | ORAL | Status: DC
  Administered 2019-04-16 – 2019-04-17 (×2): 30 mg via ORAL
  Filled 2019-04-16 (×2): qty 1

## 2019-04-16 NOTE — Progress Notes (Signed)
PROGRESS NOTE  [name] NLZ:[id number] DOB: 1955-06-15 DOA: 04/14/2019 PCP: Chevis Pretty, FNP  HPI/Recap of past 34 hours: 64 year old female with past medical history of seizure disorder, bipolar disorder, chronic diastolic CHF and COPD plus previous diverticulosis admitted on 1/14 for several days of nausea vomiting and left lower quadrant abdominal pain and found to have acute diverticulitis with some mild free abdominal fluid, but no abscess.  White count elevated at 17,000, but no evidence of sepsis.  Started on IV antibiotics and admitted to the hospitalist service.  This following morning, patient still having moderate left lower quadrant abdominal pain.  No nausea.  White count normalized.  No other complaints.  04/16/2019: Patient seen.  Available records reviewed.  Patient continues to report intermittent chills and fever.  No documented fever.  T-max is 99.9 F.  No abdominal pain reported.  Diarrhea has improved according to the patient.  Patient is on IV ceftriaxone and metronidazole.  Assessment/Plan: Sigmoid diverticulitis:  Continue IV antibiotics and as pain decreases, will start to advance p.o. diet. 04/16/2019: We will transition to oral antibiotics on discharge.  Essential hypertension:  Stable, continue home medications. 04/16/2019: Continue to monitor closely.  Blood pressure documented last was 165/92 mmHg.  Restart home antihypertensive medications (Coreg 6.25 Mg p.o. twice daily and Imdur 30 mg p.o. once daily).  Recurrent major depressive disorder, in partial remission ([place]):  Stable.  Continue home medications.  Seizures ([place]):  Stable, continue Keppra.  GAD (generalized anxiety disorder):  Stable at this time  Chronic pain:  As needed IV pain medication.  Chronic diastolic CHF (congestive heart failure) ([place]):  Stable.  Noted patient had listing of a decreased oxygen saturation on room air although she herself reports no shortness of  breath.  BMP normal.  Overweight (BMI 25.0-29.9):  Meets criteria BMI greater than 25.  Code Status: Full code  Family Communication:   Disposition Plan: Home once able to tolerate solid food    Consultants:  none   Procedures:  None   Antimicrobials:  IV Rocephin & Flagyl 1/14-present   DVT prophylaxis: Lovenox   Objective: Vitals:   04/16/19 0556 04/16/19 1433  BP: (!) 178/89 (!) 165/92  Pulse: 80 74  Resp: 16 16  Temp: 99.9 F (37.7 C) 98.5 F (36.9 C)  SpO2: 96% 97%    Intake/Output Summary (Last 24 hours) at 04/16/2019 1749 Last data filed at 04/16/2019 1500 Gross per 24 hour  Intake 720 ml  Output --  Net 720 ml   Filed Weights   04/14/19 1701  Weight: 68 kg   Body mass index is 27.44 kg/m.  Exam:   General: Alert and oriented x3  Cardiovascular: Regular rate and rhythm, S1-S2  Respiratory: Clear to auscultation bilaterally  Abdomen: Soft, with minimal tenderness on palpation of left lower quadrant.  Organs are difficult to assess  Musculoskeletal: No leg edema Neuro: Awake and alert.  Patient moves all extremities.    Data Reviewed: CBC: Recent Labs  Lab 04/14/19 1747 04/15/19 0527 04/16/19 0809  WBC 17.4* 10.4 10.7*  NEUTROABS  --  6.9  --   HGB 12.2 9.4* 10.8*  HCT 41.0 32.5* 36.0  MCV 84.7 86.7 84.5  PLT 299 189 902   Basic Metabolic Panel: Recent Labs  Lab 04/14/19 1747 04/15/19 0527  NA 137 140  K 4.0 3.9  CL 101 106  CO2 24 25  GLUCOSE 108* 91  BUN 8 9  CREATININE 0.66 0.67  CALCIUM 9.5 8.4*  GFR: Estimated Creatinine Clearance: 66 mL/min (by C-G formula based on SCr of 0.67 mg/dL). Liver Function Tests: Recent Labs  Lab 04/14/19 1747  AST 23  ALT 16  ALKPHOS 135*  BILITOT 0.6  PROT 8.2*  ALBUMIN 3.8   Recent Labs  Lab 04/14/19 1747  LIPASE 15   No results for input(s): AMMONIA in the last 168 hours. Coagulation Profile: No results for input(s): INR, PROTIME in the last 168 hours. Cardiac  Enzymes: No results for input(s): CKTOTAL, CKMB, CKMBINDEX, TROPONINI in the last 168 hours. BNP (last 3 results) No results for input(s): PROBNP in the last 8760 hours. HbA1C: No results for input(s): HGBA1C in the last 72 hours. CBG: No results for input(s): GLUCAP in the last 168 hours. Lipid Profile: No results for input(s): CHOL, HDL, LDLCALC, TRIG, CHOLHDL, LDLDIRECT in the last 72 hours. Thyroid Function Tests: No results for input(s): TSH, T4TOTAL, FREET4, T3FREE, THYROIDAB in the last 72 hours. Anemia Panel: No results for input(s): VITAMINB12, FOLATE, FERRITIN, TIBC, IRON, RETICCTPCT in the last 72 hours. Urine analysis:    Component Value Date/Time   COLORURINE YELLOW 04/14/2019 2248   APPEARANCEUR CLEAR 04/14/2019 2248   APPEARANCEUR Clear 12/16/2018 1626   LABSPEC >1.046 (H) 04/14/2019 2248   PHURINE 5.0 04/14/2019 2248   GLUCOSEU NEGATIVE 04/14/2019 2248   HGBUR NEGATIVE 04/14/2019 2248   BILIRUBINUR NEGATIVE 04/14/2019 2248   BILIRUBINUR Negative 12/16/2018 1626   KETONESUR 5 (A) 04/14/2019 2248   PROTEINUR NEGATIVE 04/14/2019 2248   UROBILINOGEN negative 07/27/2013 1133   UROBILINOGEN 0.2 06/12/2008 1402   NITRITE NEGATIVE 04/14/2019 2248   LEUKOCYTESUR NEGATIVE 04/14/2019 2248   Sepsis Labs: @LABRCNTIP (procalcitonin:4,lacticidven:4)  ) Recent Results (from the past 240 hour(s))  Respiratory Panel by RT PCR (Flu A&B, Covid) - Nasopharyngeal Swab     Status: None   Collection Time: 04/14/19  6:28 PM   Specimen: Nasopharyngeal Swab  Result Value Ref Range Status   SARS Coronavirus 2 by RT PCR NEGATIVE NEGATIVE Final    Comment: (NOTE) SARS-CoV-2 target nucleic acids are NOT DETECTED. The SARS-CoV-2 RNA is generally detectable in upper respiratoy specimens during the acute phase of infection. The lowest concentration of SARS-CoV-2 viral copies this assay can detect is 131 copies/mL. A negative result does not preclude SARS-Cov-2 infection and should not  be used as the sole basis for treatment or other patient management decisions. A negative result may occur with  improper specimen collection/handling, submission of specimen other than nasopharyngeal swab, presence of viral mutation(s) within the areas targeted by this assay, and inadequate number of viral copies (<131 copies/mL). A negative result must be combined with clinical observations, patient history, and epidemiological information. The expected result is Negative. Fact Sheet for Patients:  04/16/19 Fact Sheet for Healthcare Providers:  https://www.moore.com/ This test is not yet ap proved or cleared by the https://www.young.biz/ FDA and  has been authorized for detection and/or diagnosis of SARS-CoV-2 by FDA under an Emergency Use Authorization (EUA). This EUA will remain  in effect (meaning this test can be used) for the duration of the COVID-19 declaration under Section 564(b)(1) of the Act, 21 U.S.C. section 360bbb-3(b)(1), unless the authorization is terminated or revoked sooner.    Influenza A by PCR NEGATIVE NEGATIVE Final   Influenza B by PCR NEGATIVE NEGATIVE Final    Comment: (NOTE) The Xpert Xpress SARS-CoV-2/FLU/RSV assay is intended as an aid in  the diagnosis of influenza from Nasopharyngeal swab specimens and  should not be used as  a sole basis for treatment. Nasal washings and  aspirates are unacceptable for Xpert Xpress SARS-CoV-2/FLU/RSV  testing. Fact Sheet for Patients: https://www.moore.com/ Fact Sheet for Healthcare Providers: https://www.young.biz/ This test is not yet approved or cleared by the [place] FDA and  has been authorized for detection and/or diagnosis of SARS-CoV-2 by  FDA under an Emergency Use Authorization (EUA). This EUA will remain  in effect (meaning this test can be used) for the duration of the  Covid-19 declaration under Section 564(b)(1) of  the Act, 21  U.S.C. section 360bbb-3(b)(1), unless the authorization is  terminated or revoked. Performed at [facility], [address] [place], [place] [address]   Blood Culture (routine x 2)     Status: None (Preliminary result)   Collection Time: 04/14/19  7:17 PM   Specimen: BLOOD LEFT FOREARM  Result Value Ref Range Status   Specimen Description BLOOD LEFT FOREARM  Final   Special Requests   Final    BOTTLES DRAWN AEROBIC AND ANAEROBIC Blood Culture adequate volume   Culture   Final    NO GROWTH 2 DAYS Performed at [facility], [address] [place], [place] [address]    Report Status PENDING  Incomplete  Blood Culture (routine x 2)     Status: None (Preliminary result)   Collection Time: 04/14/19  7:17 PM   Specimen: BLOOD LEFT HAND  Result Value Ref Range Status   Specimen Description BLOOD LEFT HAND  Final   Special Requests   Final    BOTTLES DRAWN AEROBIC AND ANAEROBIC Blood Culture adequate volume   Culture   Final    NO GROWTH 2 DAYS Performed at [facility], [address] [place], [place] [address]    Report Status PENDING  Incomplete  Urine culture     Status: Abnormal   Collection Time: 04/14/19 10:48 PM   Specimen: In/Out Cath Urine  Result Value Ref Range Status   Specimen Description   Final    IN/OUT CATH URINE Performed at [facility], [address], [place], [place] [address]    Special Requests   Final    NONE Performed at [facility], [address], [place], [place] [address]    Culture (A)  Final    <10,000 COLONIES/mL INSIGNIFICANT GROWTH Performed at [facility], [address], [place], [place] [address]    Report Status 04/16/2019 FINAL  Final      Studies: No results found.  Scheduled Meds: . enoxaparin (LOVENOX) injection  40 mg Subcutaneous Q24H  . lamoTRIgine  150 mg Oral Daily  . levETIRAcetam  500 mg Oral BID  . sodium chloride flush  3 mL Intravenous Once    Continuous Infusions: . cefTRIAXone (ROCEPHIN)  IV  2 g (04/15/19 2116)  . metronidazole 500 mg (04/16/19 1416)     LOS: 2 days     Barnetta Chapel, MD [facility]  To reach me or the doctor on call, go to: [email or web address] Password [facility]  04/16/2019, 5:49 PM

## 2019-04-17 LAB — CBC WITH DIFFERENTIAL/PLATELET
Abs Immature Granulocytes: 0.03 10*3/uL (ref 0.00–0.07)
Basophils Absolute: 0 10*3/uL (ref 0.0–0.1)
Basophils Relative: 0 %
Eosinophils Absolute: 0.1 10*3/uL (ref 0.0–0.5)
Eosinophils Relative: 1 %
HCT: 35.1 % — ABNORMAL LOW (ref 36.0–46.0)
Hemoglobin: 10.6 g/dL — ABNORMAL LOW (ref 12.0–15.0)
Immature Granulocytes: 0 %
Lymphocytes Relative: 26 %
Lymphs Abs: 1.8 10*3/uL (ref 0.7–4.0)
MCH: 25.3 pg — ABNORMAL LOW (ref 26.0–34.0)
MCHC: 30.2 g/dL (ref 30.0–36.0)
MCV: 83.8 fL (ref 80.0–100.0)
Monocytes Absolute: 0.6 10*3/uL (ref 0.1–1.0)
Monocytes Relative: 9 %
Neutro Abs: 4.4 10*3/uL (ref 1.7–7.7)
Neutrophils Relative %: 64 %
Platelets: 286 10*3/uL (ref 150–400)
RBC: 4.19 MIL/uL (ref 3.87–5.11)
RDW: 15.3 % (ref 11.5–15.5)
WBC: 7 10*3/uL (ref 4.0–10.5)
nRBC: 0 % (ref 0.0–0.2)

## 2019-04-17 LAB — RENAL FUNCTION PANEL
Albumin: 3.1 g/dL — ABNORMAL LOW (ref 3.5–5.0)
Anion gap: 14 (ref 5–15)
BUN: 6 mg/dL — ABNORMAL LOW (ref 8–23)
CO2: 24 mmol/L (ref 22–32)
Calcium: 9.2 mg/dL (ref 8.9–10.3)
Chloride: 102 mmol/L (ref 98–111)
Creatinine, Ser: 0.59 mg/dL (ref 0.44–1.00)
GFR calc Af Amer: >60 mL/min (ref >60)
GFR calc non Af Amer: >60 mL/min (ref >60)
Glucose, Bld: 87 mg/dL (ref 70–99)
Phosphorus: 4.1 mg/dL (ref 2.5–4.6)
Potassium: 3.6 mmol/L (ref 3.5–5.1)
Sodium: 140 mmol/L (ref 135–145)

## 2019-04-17 NOTE — Progress Notes (Signed)
-  Went into the patient's room to look for patient but could not find patient. -Discussed with the patient's nurse. -There are concerns that patient may have leave the hospital without letting anyone know.   -We will follow hospital protocol.

## 2019-04-17 NOTE — Discharge Summary (Signed)
Discharge summary Admitted: 04/14/2019 Patient left the hospital on: 04/17/2019 (patient left the hospital without informing anyone)   Brief history and hospital course: Patient is a 64 year old female with past medical history significant for seizure disorder, bipolar disorder, chronic diastolic CHF, diverticulosis and COPD.  Patient was admitted on 04/14/2019 with several days of nausea, vomiting and left lower quadrant abdominal pain.  CT scan of the abdomen and pelvis with contrast done on admission revealed sigmoid diverticulitis, mild diffuse hepatic steatosis and a moderately large hiatal hernia.  Patient was treated with IV antibiotics.  Patient left the hospital today without informing anyone.

## 2019-04-17 NOTE — Progress Notes (Signed)
Dr. Berton Mount, went in patient's room around 12:30 to assess patient, patient was noted not to be in room, IV was noted to be left on bedside table.  Security was called and notified patient left hospital without notice.  Called patient's boyfriend, Casimer Bilis., to make aware patient had left hospital Patient noted to have been picked up by someone at 12:25 per security by review of cameras.  Patient called back to [address] to inform that she had made it home safely.

## 2019-04-18 ENCOUNTER — Ambulatory Visit (INDEPENDENT_AMBULATORY_CARE_PROVIDER_SITE_OTHER): Payer: 59 | Admitting: Nurse Practitioner

## 2019-04-18 ENCOUNTER — Encounter: Payer: Self-pay | Admitting: Nurse Practitioner

## 2019-04-18 DIAGNOSIS — K5732 Diverticulitis of large intestine without perforation or abscess without bleeding: Secondary | ICD-10-CM

## 2019-04-18 MED ORDER — CIPROFLOXACIN HCL 500 MG PO TABS: 500.0000 mg | ORAL_TABLET | Freq: Two times a day (BID) | ORAL | 0 refills | Status: DC

## 2019-04-18 MED ORDER — METRONIDAZOLE 500 MG PO TABS: 500.0000 mg | ORAL_TABLET | Freq: Two times a day (BID) | ORAL | 0 refills | Status: DC

## 2019-04-18 NOTE — Progress Notes (Signed)
Virtual Visit via telephone Note Due to COVID-19 pandemic this visit was conducted virtually. This visit type was conducted due to national recommendations for restrictions regarding the COVID-19 Pandemic (e.g. social distancing, sheltering in place) in an effort to limit this patient's exposure and mitigate transmission in our community. All issues noted in this document were discussed and addressed.  A physical exam was not performed with this format.  I connected with [name] on 04/18/19 at 9:30 by telephone and verified that I am speaking with the correct person using two identifiers. [name] is currently located at home and no ne is currently with her during visit. The provider, [name] Daphine Deutscher, FNP is located in their office at time of visit.  I discussed the limitations, risks, security and privacy concerns of performing an evaluation and management service by telephone and the availability of in person appointments. I also discussed with the patient that there may be a patient responsible charge related to this service. The patient expressed understanding and agreed to proceed.   History and Present Illness:   Chief Complaint: Abdominal Pain   HPI Patient went to the ED at [facility] on Thursday 04/14/19 with abdominal pain. She had blood work and test ran and was diagnosed with sigmoid diverticulitis. Patient said while she was there the ED nurses were very busy and she said patients were dying all around her and she got scared and just left hospital. She says that she did get some IV antibiotics before she left but did not ger a prescription for at home antibiotics. She is still having slight abdominal pain.   Review of Systems  Constitutional: Negative for diaphoresis and weight loss.  Eyes: Negative for blurred vision, double vision and pain.  Respiratory: Negative for shortness of breath.   Cardiovascular: Negative for chest pain, palpitations, orthopnea  and leg swelling.  Gastrointestinal: Positive for abdominal pain. Negative for blood in stool, constipation, diarrhea, nausea and vomiting.  Skin: Negative for rash.  Neurological: Negative for dizziness, sensory change, loss of consciousness, weakness and headaches.  Endo/Heme/Allergies: Negative for polydipsia. Does not bruise/bleed easily.  Psychiatric/Behavioral: Negative for memory loss. The patient does not have insomnia.   All other systems reviewed and are negative.    Observations/Objective: Alert and oriented- answers all questions appropriately No distress    Assessment and Plan: [name] in today with chief complaint of Abdominal Pain   1. Diverticulitis of large intestine without perforation or abscess without bleeding Avoid foods with seeds or indigestible skin Force fluids Hospital records reviewed - metroNIDAZOLE (FLAGYL) 500 MG tablet; Take 1 tablet (500 mg total) by mouth 2 (two) times daily.  Dispense: 20 tablet; Refill: 0 - ciprofloxacin (CIPRO) 500 MG tablet; Take 1 tablet (500 mg total) by mouth 2 (two) times daily.  Dispense: 20 tablet; Refill: 0   Follow Up Instructions: prn    I discussed the assessment and treatment plan with the patient. The patient was provided an opportunity to ask questions and all were answered. The patient agreed with the plan and demonstrated an understanding of the instructions.   The patient was advised to call back or seek an in-person evaluation if the symptoms worsen or if the condition fails to improve as anticipated.  The above assessment and management plan was discussed with the patient. The patient verbalized understanding of and has agreed to the management plan. Patient is aware to call the clinic if symptoms persist or worsen. Patient is aware  when to return to the clinic for a follow-up visit. Patient educated on when it is appropriate to go to the emergency department.   Time call ended:  9:50  I  provided 20 minutes of non-face-to-face time during this encounter.    [name] Hassell Done, FNP

## 2019-04-19 LAB — CULTURE, BLOOD (ROUTINE X 2)
Culture: NO GROWTH
Culture: NO GROWTH
Special Requests: ADEQUATE
Special Requests: ADEQUATE

## 2019-04-21 ENCOUNTER — Encounter: Payer: Self-pay | Admitting: Cardiology

## 2019-04-21 ENCOUNTER — Telehealth (INDEPENDENT_AMBULATORY_CARE_PROVIDER_SITE_OTHER): Payer: 59 | Admitting: Cardiology

## 2019-04-21 VITALS — Ht 62.0 in | Wt 140.0 lb

## 2019-04-21 DIAGNOSIS — R55 Syncope and collapse: Secondary | ICD-10-CM | POA: Diagnosis not present

## 2019-04-21 DIAGNOSIS — G909 Disorder of the autonomic nervous system, unspecified: Secondary | ICD-10-CM

## 2019-04-21 NOTE — Patient Instructions (Signed)
Your physician recommends that you schedule a follow-up appointment in: 4 MONTHS WITH DR BRANCH  Your physician recommends that you continue on your current medications as directed. Please refer to the Current Medication list given to you today.  Thank you for choosing [facility]!!    

## 2019-04-21 NOTE — Progress Notes (Signed)
Virtual Visit via Telephone Note   This visit type was conducted due to national recommendations for restrictions regarding the COVID-19 Pandemic (e.g. social distancing) in an effort to limit this patient's exposure and mitigate transmission in our community.  Due to her co-morbid illnesses, this patient is at least at moderate risk for complications without adequate follow up.  This format is felt to be most appropriate for this patient at this time.  The patient did not have access to video technology/had technical difficulties with video requiring transitioning to audio format only (telephone).  All issues noted in this document were discussed and addressed.  No physical exam could be performed with this format.  Please refer to the patient's chart for her  consent to telehealth for [facility].   Date:  04/21/2019   ID:  [name], DOB 05/08/1955, MRN [id number]  Patient Location: Home Provider Location: Office  PCP:  Chevis Pretty, FNP  Cardiologist:  Carlyle Dolly, MD  Electrophysiologist:  None   Evaluation Performed:  Follow-Up Visit  Chief Complaint:  Follow up  History of Present Illness:    [name] is a 64 y.o. female  seen today for follow up of the following medical problems.This is a focused visit on her history of autonomic dysfunction     1. Recurrent syncope/Autonomic dysfunction - followed by Dr Lovena Le, according to notes prior loop recorder showed no clear arrhythmias - thought to be neurally mediated syncope secondary to vasodepression according to notes, has been started on florinef daily and midodrine prn - referred to autonomic specialist at [facility]. Issues getting arranged with transporation.    - we lowered florinef to 0.1mg  daily due to very high bp's. She is on coreg 9.375mg  bid scheduled  - last visit we changed florinef to 0.1mg  every other day  2. HTN - pcp stopped valium earlier this month. Since  then she reports elevating bp's - 180s/120s 180/124 201/140 197/127. HRs 57-100s - on coreg 9.375mg  bid, on florinef 0.1mg  every other day  - no recent low bp's - ongoing high bp's. SBP 180s at times. 139/96    3. Diverticulitis - recent admission Jan 2021 with diverticulitis  The patient does not have symptoms concerning for COVID-19 infection (fever, chills, cough, or new shortness of breath).    Past Medical History:  Diagnosis Date  . Anginal pain ([place])    last time   . Anxiety   . Arthritis    RHEUMATOID  . Asthma   . Bipolar 1 disorder ([place])   . Cataracts, bilateral 07/2017  . COPD (chronic obstructive pulmonary disease) ([place])   . Coronary artery disease    reported hx of "MI";  Echo 2009 with normal LVF;  Myoview 05/2011: no ischemia  . Depression   . Dyslipidemia   . Dysrhythmia    SVT  . Esophageal stricture   . Fibromyalgia   . GERD (gastroesophageal reflux disease)   . H/O hiatal hernia   . Head injury, unspecified   . Herpes simplex infection   . History of kidney stones   . Hyperlipidemia   . Hypertension   . Insomnia   . Myocardial infarction [facility])    age 12  . Osteoporosis   . Pneumonia    hx  . Seizures ([place])    last 3 weeks ago- prescribed keppra -never took didnt like side effects read about online  . Shortness of breath   . Sleep apnea    ? neg  .  Spinal stenosis of lumbar region   . Spondylolisthesis   . Status post placement of implantable loop recorder   . Supraventricular tachycardia ([facility])   . Syncope and collapse    s/p ILR; no arhythmogenic cause identified  . UTI (lower urinary tract infection)    Past Surgical History:  Procedure Laterality Date  . BACK SURGERY    . BREAST SURGERY     lumpectomy  . CATARACT EXTRACTION W/PHACO Right 07/31/2017   Procedure: CATARACT EXTRACTION PHACO AND INTRAOCULAR LENS PLACEMENT (IOC);  Surgeon: Fabio Pierce, MD;  Location: [facility];  Service: Ophthalmology;  Laterality: Right;  CDE: 2.33  .  CATARACT EXTRACTION W/PHACO Left 08/14/2017   Procedure: CATARACT EXTRACTION PHACO AND INTRAOCULAR LENS PLACEMENT (IOC);  Surgeon: Fabio Pierce, MD;  Location: [facility];  Service: Ophthalmology;  Laterality: Left;  CDE: 2.74  . CHOLECYSTECTOMY    . CYSTOSCOPY     stone  . DOPPLER ECHOCARDIOGRAPHY  2009  . head up tilt table testing  06/15/2007   [name]  . HEMORRHOID SURGERY    . insertion of implatable loop recorder  08/11/2007   [name]  . POSTERIOR CERVICAL FUSION/FORAMINOTOMY N/A 12/19/2013   Procedure: RIGHT C3-4.C4-5 AND C5-6 FORAMINOTOMIES;  Surgeon: Kerrin Champagne, MD;  Location: [facility] OR;  Service: Orthopedics;  Laterality: N/A;  . TUBAL LIGATION       No outpatient medications have been marked as taking for the 04/21/19 encounter (Appointment) with Antoine Poche, MD.     Allergies:   Codeine, Morphine and related, Ambien [zolpidem tartrate], Lyrica [pregabalin], and Neurontin [gabapentin]   Social History   Tobacco Use  . Smoking status: Never Smoker  . Smokeless tobacco: Never Used  Substance Use Topics  . Alcohol use: No    Alcohol/week: 0.0 standard drinks  . Drug use: No     Family Hx: The patient's family history includes Alcohol abuse in her brother; Cancer in her father and mother; Cirrhosis in her brother; Colon cancer in her maternal aunt; Diabetes in her brother; Drug abuse in her brother; Heart attack in her brother and father; Heart disease in her brother; Mental illness in her father and mother. There is no history of Stomach cancer.  ROS:   Please see the history of present illness.    All other systems reviewed and are negative.   Prior CV studies:   The following studies were reviewed today:  05/2011 Lexiscan MPI No ischemia   Jan 2009 Echo LEFT VENTRICLE: - Left ventricular size was normal. - Overall left ventricular systolic function was normal. - There were no left ventricular regional wall motion abnormalities. - Left  ventricular wall thickness was normal.  AORTIC VALVE: - The aortic valve was trileaflet. - Aortic valve thickness was normal.  AORTA: - The aortic root was normal in size. - The aortic arch was normal.  MITRAL VALVE: - Mitral valve structure was normal.  Doppler interpretation(s): - There was trivial mitral valvular regurgitation.  LEFT ATRIUM: - Left atrial size was normal.  PULMONARY VEINS: - The pulmonary veins were grossly normal.  RIGHT VENTRICLE: - Right ventricular size was normal. - Right ventricular systolic function was normal. - Right ventricular wall thickness was normal.  PULMONIC VALVE: - The structure of the pulmonic valve appeared to be normal.  TRICUSPID VALVE: - The tricuspid valve structure was normal.  Doppler interpretation(s): - There was no significant tricuspid valvular regurgitation.  PULMONARY ARTERY: - The pulmonary artery was normal size.  RIGHT  ATRIUM: - Right atrial size was normal.  SYSTEMIC VEINS: - The inferior vena cava was normal.  PERICARDIUM: - There was no pericardial effusion.  ---------------------------------------------------------------  SUMMARY - Overall left ventricular systolic function was normal. There were no left ventricular regional wall motion abnormalities. - The pulmonary veins were grossly normal.   09/2011 Cath Hemodynamic Findings: Central aortic pressure: 108/58  Left ventricular pressure: 107/10/14  Angiographic Findings:  Left main: No obstructive disease noted.  Left Anterior Descending Artery: Moderate to large sized vessel that courses to the apex. There is a moderate sized diagonal Sunni Richardson. No obstructive disease noted.  Circumflex Artery: Large, dominant artery with moderate sized first obtuse marginal Klaira Pesci and left sided posterolateral Chapin Arduini with no disease noted.  Right Coronary Artery: Small, non-dominant vessel with no disease noted.  Left Ventricular Angiogram:LVEF=65%.   Impression:  1. No angiographic evidence of CAD  2. Normal LV systolic function  3. Non-cardiac chest pain  Recommendations: No further ischemic workup.  Complications: None. The patient tolerated the procedure well.   11/2014 echo Study Conclusions  - Left ventricle: The cavity size was normal. Systolic function was normal. The estimated ejection fraction was in the range of 55% to 60%. Wall motion was normal; there were no regional wall motion abnormalities. There was an increased relative contribution of atrial contraction to ventricular filling. Doppler parameters are consistent with abnormal left ventricular relaxation (grade 1 diastolic dysfunction). - Mitral valve: There was trivial regurgitation. - Tricuspid valve: There was trivial regurgitation. - Pulmonic valve: There was trivial regurgitation.   09/2017 nuclear stress  There was no ST segment deviation noted during stress. Nonspecific T wave flattening in aVL and V2 seen throughout study.  Defect 1: There is a medium defect of mild severity present in the mid inferior, apical inferior and apical lateral location. This appears to be due to soft tissue attenuation given normal regional wall motion. No ischemic territories.  This is a low risk study.  Nuclear stress EF: 65%.  Labs/Other Tests and Data Reviewed:    EKG:  n/a  Recent Labs: 06/15/2018: TSH 2.560 04/14/2019: ALT 16 04/15/2019: B Natriuretic Peptide 21.0 04/17/2019: BUN 6; Creatinine, Ser 0.59; Hemoglobin 10.6; Platelets 286; Potassium 3.6; Sodium 140   Recent Lipid Panel Lab Results  Component Value Date/Time   CHOL 186 03/08/2019 03:41 PM   TRIG 180 (H) 03/08/2019 03:41 PM   TRIG 233 (H) 07/24/2014 10:40 AM   HDL 57 03/08/2019 03:41 PM   HDL 47 07/24/2014 10:40 AM   CHOLHDL 3.3 03/08/2019 03:41 PM   CHOLHDL 4.2 CALC 04/22/2007 10:12 AM   LDLCALC 98 03/08/2019 03:41 PM   LDLDIRECT 142.1 04/22/2007 10:12 AM    Wt Readings  from Last 3 Encounters:  04/14/19 150 lb (68 kg)  03/17/19 150 lb (68 kg)  03/08/19 155 lb (70.3 kg)     Objective:    Vital Signs:   Today's Vitals   04/21/19 0848  Weight: 140 lb (63.5 kg)  Height: 5\' 2"  (1.575 m)   Body mass index is 25.61 kg/m. Normal affect. Normal speech pattern and tone. Comfortable, no apparent distress. No audible signs of SOB or wheezing.   ASSESSMENT & PLAN:     1. Syncope/Autonomic dysfunction -difficult balance between severe symptomatic both hypotension and HTN. Currently on florinef 0.1mg  every other day and coreg 9.375 mg bid. - doing reasonably well, avoiding consistent severe highs or lows - continue current therapy.     COVID-19 Education: The signs and symptoms  of COVID-19 were discussed with the patient and how to seek care for testing (follow up with PCP or arrange E-visit).  The importance of social distancing was discussed today.  Time:   Today, I have spent 14 minutes with the patient with telehealth technology discussing the above problems.     Medication Adjustments/Labs and Tests Ordered: Current medicines are reviewed at length with the patient today.  Concerns regarding medicines are outlined above.   Tests Ordered: No orders of the defined types were placed in this encounter.   Medication Changes: No orders of the defined types were placed in this encounter.   Follow Up:  Either In Person or Virtual in 4 month(s)  Signed, Dina Rich, MD  04/21/2019 8:40 AM    [facility]

## 2019-04-27 ENCOUNTER — Telehealth: Payer: Self-pay | Admitting: Nurse Practitioner

## 2019-05-02 ENCOUNTER — Encounter: Payer: Self-pay | Admitting: Nurse Practitioner

## 2019-05-02 ENCOUNTER — Other Ambulatory Visit: Payer: Self-pay

## 2019-05-02 ENCOUNTER — Ambulatory Visit (INDEPENDENT_AMBULATORY_CARE_PROVIDER_SITE_OTHER): Payer: 59 | Admitting: Nurse Practitioner

## 2019-05-02 VITALS — BP 138/97 | HR 123 | Temp 98.0°F | Resp 20 | Ht 62.0 in | Wt 146.0 lb

## 2019-05-02 DIAGNOSIS — M25511 Pain in right shoulder: Secondary | ICD-10-CM

## 2019-05-02 MED ORDER — METHYLPREDNISOLONE ACETATE 40 MG/ML IJ SUSP
40.0000 mg | Freq: Once | INTRAMUSCULAR | Status: AC
  Administered 2019-05-02: 40 mg via INTRAMUSCULAR

## 2019-05-02 MED ORDER — BUPIVACAINE HCL 0.25 % IJ SOLN
1.0000 mL | Freq: Once | INTRAMUSCULAR | Status: AC
  Administered 2019-05-02: 1 mL via INTRA_ARTICULAR

## 2019-05-02 NOTE — Progress Notes (Signed)
   Subjective:    Patient ID: [name], female    DOB: Jun 01, 1955, 64 y.o.   MRN: [id number]   Chief Complaint: Shoulder Pain (Right)   HPI Patient come sin today c/o right shoulder pan. Has been hurting for over 6 months. Pain is worsening. Rates pain 8/10. Pain increases with movement. Denies any distal numbness or tingling.   Review of Systems  Constitutional: Negative.   Cardiovascular: Negative.   Gastrointestinal: Negative.   Musculoskeletal: Positive for arthralgias (right shoulder).  All other systems reviewed and are negative.       Objective:   Physical Exam Vitals and nursing note reviewed.  Constitutional:      Appearance: Normal appearance.  Cardiovascular:     Rate and Rhythm: Normal rate.     Heart sounds: Normal heart sounds.  Pulmonary:     Breath sounds: Normal breath sounds.  Musculoskeletal:     Comments: Decrease ROM of right shoulder due to pain with any movement Grip  equal bil  Skin:    General: Skin is warm.  Neurological:     General: No focal deficit present.     Mental Status: She is alert and oriented to person, place, and time.  Psychiatric:        Mood and Affect: Mood normal.        Behavior: Behavior normal.    BP (!) 138/97   Pulse (!) 123   Temp 98 F (36.7 C) (Temporal)   Resp 20   Ht 5\' 2"  (1.575 m)   Wt 146 lb (66.2 kg)   SpO2 97%   BMI 26.70 kg/m   Joint Injection/Arthrocentesis  Date/Time: 05/02/2019 12:46 PM Performed by: 06/30/2019, FNP Authorized by: Bennie Pierini [name], FNP  Indications: pain  Body area: shoulder Joint: right shoulder Local anesthesia used: no  Anesthesia: Local anesthesia used: no  Sedation: Patient sedated: no  Preparation: Patient was prepped and draped in the usual sterile fashion. Needle size: 22 G Ultrasound guidance: no Approach: posterior Methylprednisolone amount: 40 mg Bupivacaine 0.25% amount: 1 mL Patient tolerance: patient tolerated the procedure  well with no immediate complications            Assessment & Plan:  [name] in today with chief complaint of Shoulder Pain (Right)   1. Acute pain of right shoulder ic ebid Rest' RTO pn - methylPREDNISolone acetate (DEPO-MEDROL) injection 40 mg - bupivacaine (MARCAINE) 0.25 % (with pres) injection 1 mL    The above assessment and management plan was discussed with the patient. The patient verbalized understanding of and has agreed to the management plan. Patient is aware to call the clinic if symptoms persist or worsen. Patient is aware when to return to the clinic for a follow-up visit. Patient educated on when it is appropriate to go to the emergency department.   [name] 06-19-1969, FNP

## 2019-05-02 NOTE — Patient Instructions (Signed)
Joint Steroid Injection A joint steroid injection is a procedure to relieve swelling and pain in a joint. Steroids are medicines that reduce inflammation. In this procedure, your health care provider uses a syringe and a needle to inject a steroid medicine into a painful and inflamed joint. A pain-relieving medicine (anesthetic) may be injected along with the steroid. In some cases, your health care provider may use an imaging technique such as ultrasound or fluoroscopy to guide the injection. Joints that are often treated with steroid injections include the knee, shoulder, hip, and spine. These injections may also be used in the elbow, ankle, and joints of the hands or feet. You may have joint steroid injections as part of your treatment for inflammation caused by:  Gout.  Rheumatoid arthritis.  Advanced wear-and-tear arthritis (osteoarthritis).  Tendinitis.  Bursitis. Joint steroid injections may be repeated, but having them too often can damage a joint or the skin over the joint. You should not have joint steroid injections less than 6 weeks apart or more than four times a year. Tell a health care provider about:  Any allergies you have.  All medicines you are taking, including vitamins, herbs, eye drops, creams, and over-the-counter medicines.  Any problems you or family members have had with anesthetic medicines.  Any blood disorders you have.  Any surgeries you have had.  Any medical conditions you have.  Whether you are pregnant or may be pregnant. What are the risks? Generally, this is a safe treatment. However, problems may occur, including:  Infection.  Bleeding.  Allergic reactions to medicines.  Damage to the joint or tissues around the joint.  Thinning of skin or loss of skin color over the joint.  Temporary flushing of the face or chest.  Temporary increase in pain.  Temporary increase in blood sugar.  Failure to relieve inflammation or pain. What  happens before the treatment?  You may have imaging tests of your joint.  Ask your health care provider about: ? Changing or stopping your regular medicines. This is especially important if you are taking diabetes medicines or blood thinners. ? Taking medicines such as aspirin and ibuprofen. These medicines can thin your blood. Do not take these medicines unless your health care provider tells you to take them. ? Taking over-the-counter medicines, vitamins, herbs, and supplements.  Ask your health care provider if you can drive yourself home after the procedure. What happens during the treatment?   Your health care provider will position you for the injection and locate the injection site over your joint.  The skin over the joint will be cleaned with a germ-killing soap.  Your health care provider may: ? Spray a numbing solution (topical anesthetic) over the injection site. ? Inject a local anesthetic under the skin above your joint.  The needle will be placed through your skin into your joint. Your health care provider may use imaging to guide the needle to the right spot for the injection. If imaging is used, a special contrast dye may be injected to confirm that the needle is in the correct location.  The steroid medicine will be injected into your joint.  Anesthetic may be injected along with the steroid. This may be a medicine that relieves pain for a short time (short-acting anesthetic) or for a longer time (long-acting anesthetic).  The needle will be removed, and an adhesive bandage (dressing) will be placed over the injection site. The procedure may vary among health care providers and hospitals. What can I   expect after the treatment?  You will be able to go home after the treatment.  It is normal to feel slight flushing for a few days after the injection.  After the treatment, it is common to have an increase in joint pain after the anesthetic has worn off. This may  happen about an hour after a short-acting anesthetic or about 8 hours after a longer-acting anesthetic.  You should begin to feel relief from joint pain and swelling after 24 to 48 hours. Follow these instructions at home: Injection site care  Leave the adhesive dressing over your injection site in place until your health care provider says you can remove it.  Check your injection site every day for signs of infection. Check for: ? Redness, swelling, or pain. ? Fluid or blood. ? Warmth. ? Pus or a bad smell. Activity  Return to your normal activities as told by your health care provider. Ask your health care provider what activities are safe for you. You may be asked to limit activities that put stress on the joint for a few days.  Do joint exercises as told by your health care provider.  Do not take baths, swim, or use a hot tub until your health care provider approves. Managing pain, stiffness, and swelling   If directed, put ice on the joint. ? Put ice in a plastic bag. ? Place a towel between your skin and the bag. ? Leave the ice on for 20 minutes, 2-3 times a day.  Raise (elevate) your joint above the level of your heart when you are sitting or lying down. General instructions  Take over-the-counter and prescription medicines only as told by your health care provider.  Do not use any products that contain nicotine or tobacco, such as cigarettes, e-cigarettes, and chewing tobacco. These can delay joint healing. If you need help quitting, ask your health care provider.  If you have diabetes, be aware that your blood sugar may be slightly elevated for several days after the injection.  Keep all follow-up visits as told by your health care provider. This is important. Contact a health care provider if you have:  Chills or a fever.  Any signs of infection at your injection site.  Increased pain or swelling or no relief after 2 days. Summary  A joint steroid injection  is a treatment to relieve pain and swelling in a joint.  Steroids are medicines that reduce inflammation. Your health care provider may add an anesthetic along with the steroid.  You may have joint steroid injections as part of your arthritis treatment.  Joint steroid injections may be repeated, but having them too often can damage a joint or the skin over the joint.  Contact your health care provider if you have a fever, chills, or signs of infection or if you get no relief from joint pain or swelling. This information is not intended to replace advice given to you by your health care provider. Make sure you discuss any questions you have with your health care provider. Document Revised: 11/17/2017 Document Reviewed: 11/17/2017 [employer] Patient Education  2020 [employer]  

## 2019-05-03 ENCOUNTER — Other Ambulatory Visit: Payer: Self-pay | Admitting: Nurse Practitioner

## 2019-05-03 DIAGNOSIS — F5101 Primary insomnia: Secondary | ICD-10-CM

## 2019-05-05 ENCOUNTER — Ambulatory Visit: Payer: Self-pay | Admitting: Nurse Practitioner

## 2019-05-23 ENCOUNTER — Other Ambulatory Visit: Payer: Self-pay | Admitting: *Deleted

## 2019-05-23 DIAGNOSIS — G44229 Chronic tension-type headache, not intractable: Secondary | ICD-10-CM

## 2019-05-23 MED ORDER — BUTALBITAL-APAP-CAFFEINE 50-325-40 MG PO TABS: 1.0000 | ORAL_TABLET | Freq: Four times a day (QID) | ORAL | 1 refills | Status: DC | PRN

## 2019-06-01 ENCOUNTER — Other Ambulatory Visit: Payer: Self-pay | Admitting: Cardiology

## 2019-06-01 ENCOUNTER — Other Ambulatory Visit: Payer: Self-pay | Admitting: Nurse Practitioner

## 2019-06-01 DIAGNOSIS — F5101 Primary insomnia: Secondary | ICD-10-CM

## 2019-06-01 DIAGNOSIS — F411 Generalized anxiety disorder: Secondary | ICD-10-CM

## 2019-06-01 DIAGNOSIS — F331 Major depressive disorder, recurrent, moderate: Secondary | ICD-10-CM

## 2019-06-01 DIAGNOSIS — E785 Hyperlipidemia, unspecified: Secondary | ICD-10-CM

## 2019-06-01 DIAGNOSIS — N941 Unspecified dyspareunia: Secondary | ICD-10-CM

## 2019-06-02 ENCOUNTER — Other Ambulatory Visit: Payer: Self-pay | Admitting: Nurse Practitioner

## 2019-06-02 DIAGNOSIS — K2100 Gastro-esophageal reflux disease with esophagitis, without bleeding: Secondary | ICD-10-CM

## 2019-06-08 ENCOUNTER — Other Ambulatory Visit: Payer: Self-pay

## 2019-06-09 ENCOUNTER — Ambulatory Visit (INDEPENDENT_AMBULATORY_CARE_PROVIDER_SITE_OTHER): Payer: 59 | Admitting: Nurse Practitioner

## 2019-06-09 ENCOUNTER — Encounter: Payer: Self-pay | Admitting: Nurse Practitioner

## 2019-06-09 VITALS — BP 119/74 | HR 101 | Temp 98.7°F | Resp 20 | Ht 62.0 in | Wt 146.0 lb

## 2019-06-09 DIAGNOSIS — I200 Unstable angina: Secondary | ICD-10-CM

## 2019-06-09 DIAGNOSIS — R569 Unspecified convulsions: Secondary | ICD-10-CM

## 2019-06-09 DIAGNOSIS — I5032 Chronic diastolic (congestive) heart failure: Secondary | ICD-10-CM | POA: Diagnosis not present

## 2019-06-09 DIAGNOSIS — G4733 Obstructive sleep apnea (adult) (pediatric): Secondary | ICD-10-CM

## 2019-06-09 DIAGNOSIS — I10 Essential (primary) hypertension: Secondary | ICD-10-CM | POA: Diagnosis not present

## 2019-06-09 DIAGNOSIS — M47812 Spondylosis without myelopathy or radiculopathy, cervical region: Secondary | ICD-10-CM

## 2019-06-09 DIAGNOSIS — A6004 Herpesviral vulvovaginitis: Secondary | ICD-10-CM

## 2019-06-09 DIAGNOSIS — F5101 Primary insomnia: Secondary | ICD-10-CM

## 2019-06-09 DIAGNOSIS — F411 Generalized anxiety disorder: Secondary | ICD-10-CM

## 2019-06-09 DIAGNOSIS — K5732 Diverticulitis of large intestine without perforation or abscess without bleeding: Secondary | ICD-10-CM

## 2019-06-09 DIAGNOSIS — E785 Hyperlipidemia, unspecified: Secondary | ICD-10-CM

## 2019-06-09 DIAGNOSIS — J309 Allergic rhinitis, unspecified: Secondary | ICD-10-CM

## 2019-06-09 DIAGNOSIS — E663 Overweight: Secondary | ICD-10-CM

## 2019-06-09 DIAGNOSIS — R7303 Prediabetes: Secondary | ICD-10-CM

## 2019-06-09 DIAGNOSIS — B009 Herpesviral infection, unspecified: Secondary | ICD-10-CM

## 2019-06-09 DIAGNOSIS — M810 Age-related osteoporosis without current pathological fracture: Secondary | ICD-10-CM

## 2019-06-09 DIAGNOSIS — G44229 Chronic tension-type headache, not intractable: Secondary | ICD-10-CM

## 2019-06-09 DIAGNOSIS — F3341 Major depressive disorder, recurrent, in partial remission: Secondary | ICD-10-CM

## 2019-06-09 DIAGNOSIS — M47817 Spondylosis without myelopathy or radiculopathy, lumbosacral region: Secondary | ICD-10-CM

## 2019-06-09 DIAGNOSIS — F331 Major depressive disorder, recurrent, moderate: Secondary | ICD-10-CM

## 2019-06-09 DIAGNOSIS — K2100 Gastro-esophageal reflux disease with esophagitis, without bleeding: Secondary | ICD-10-CM

## 2019-06-09 LAB — BAYER DCA HB A1C WAIVED: HB A1C (BAYER DCA - WAIVED): 6 % (ref <7.0)

## 2019-06-09 MED ORDER — TRAZODONE HCL 150 MG PO TABS: 150.0000 mg | ORAL_TABLET | Freq: Every day | ORAL | 1 refills | Status: DC

## 2019-06-09 MED ORDER — LAMOTRIGINE 150 MG PO TABS: 150.0000 mg | ORAL_TABLET | Freq: Every day | ORAL | 1 refills | Status: DC

## 2019-06-09 MED ORDER — BUSPIRONE HCL 10 MG PO TABS: 10.0000 mg | ORAL_TABLET | Freq: Two times a day (BID) | ORAL | 3 refills | Status: DC | PRN

## 2019-06-09 MED ORDER — ISOSORBIDE MONONITRATE ER 30 MG PO TB24: 30.0000 mg | ORAL_TABLET | Freq: Every day | ORAL | 1 refills | Status: DC

## 2019-06-09 MED ORDER — OXYBUTYNIN CHLORIDE 5 MG PO TABS: 5.0000 mg | ORAL_TABLET | Freq: Every day | ORAL | 1 refills | Status: DC

## 2019-06-09 MED ORDER — DEXILANT 60 MG PO CPDR: 1.0000 | DELAYED_RELEASE_CAPSULE | Freq: Every day | ORAL | 1 refills | Status: DC

## 2019-06-09 MED ORDER — BREO ELLIPTA 200-25 MCG/INH IN AEPB: 1.0000 | INHALATION_SPRAY | Freq: Every day | RESPIRATORY_TRACT | 5 refills | Status: DC

## 2019-06-09 MED ORDER — CARVEDILOL 6.25 MG PO TABS: 6.2500 mg | ORAL_TABLET | Freq: Two times a day (BID) | ORAL | 1 refills | Status: DC

## 2019-06-09 MED ORDER — SUCRALFATE 1 G PO TABS: 1.0000 g | ORAL_TABLET | Freq: Three times a day (TID) | ORAL | 1 refills | Status: DC

## 2019-06-09 MED ORDER — FUROSEMIDE 40 MG PO TABS: 40.0000 mg | ORAL_TABLET | Freq: Every day | ORAL | 1 refills | Status: DC | PRN

## 2019-06-09 MED ORDER — HYDROCODONE-ACETAMINOPHEN 7.5-325 MG PO TABS: 1.0000 | ORAL_TABLET | Freq: Two times a day (BID) | ORAL | 0 refills | Status: DC

## 2019-06-09 MED ORDER — BUTALBITAL-APAP-CAFFEINE 50-325-40 MG PO TABS: 1.0000 | ORAL_TABLET | Freq: Four times a day (QID) | ORAL | 1 refills | Status: DC | PRN

## 2019-06-09 MED ORDER — ROSUVASTATIN CALCIUM 10 MG PO TABS: 10.0000 mg | ORAL_TABLET | Freq: Every day | ORAL | 1 refills | Status: DC

## 2019-06-09 MED ORDER — HYDROCODONE-ACETAMINOPHEN 7.5-325 MG PO TABS: 1.0000 | ORAL_TABLET | Freq: Four times a day (QID) | ORAL | 0 refills | Status: AC | PRN

## 2019-06-09 MED ORDER — VALACYCLOVIR HCL 500 MG PO TABS: 500.0000 mg | ORAL_TABLET | Freq: Two times a day (BID) | ORAL | 1 refills | Status: DC

## 2019-06-09 MED ORDER — DULOXETINE HCL 60 MG PO CPEP: 60.0000 mg | ORAL_CAPSULE | Freq: Every day | ORAL | 1 refills | Status: DC

## 2019-06-09 MED ORDER — LEVETIRACETAM 500 MG PO TABS: ORAL_TABLET | ORAL | 1 refills | Status: DC

## 2019-06-09 NOTE — Patient Instructions (Signed)
Pain Medicine Instructions You may need pain medicine after an injury or illness. Two common types of pain medicine are:  Opioid pain medicine. These may be called opioids.  Non-opioid pain medicine. This includes NSAIDs. It is important to follow your doctor's instructions when you are taking pain medicine. Doing this can keep yourself and others safe. How can pain medicine affect me? Pain medicine may not make all of your pain go away. It should make you comfortable enough to:  Move.  Breathe.  Do normal activities. Opioids can cause side effects, such as:  Trouble pooping (constipation).  Feeling sick to your stomach (nausea).  Throwing up (vomiting).  Feeling very sleepy.  Confusion.  Taking the medicine for nonmedical reasons even though taking it hurts your health and well-being (opioid use disorder).  Trouble breathing (respiratory depression). Taking opioids for longer than 3 days raises your risk of these side effects. Taking opioids for a long time can affect how well you can do daily tasks. Taking them for a long time also puts you at risk for:  Car crashes.  Depression.  Suicide.  Heart attack.  Taking too much of the medicine (overdose). This can lead to death. What should I do to stay safe while taking pain medicine? Take your medicine as told  Take pain medicine exactly as told by your doctor. Take it only when you need it.  Write down the times when you take your pain medicine. Look at the times before you take your next dose.  Take other over-the-counter or prescription medicines only as told by your doctor. ? If your pain medicine has acetaminophen in it, do not take any other acetaminophen while you are taking this medicine. Too much can damage the liver.  Get pain medicine prescriptions from only one doctor. Avoid certain activities While you are taking prescription pain medicine, and for 8 hours after your last dose:  Do not drive.  Do  not use machinery.  Do not use power tools.  Do not sign legal documents.  Do not drink alcohol.  Do not take sleeping pills.  Do not take care of children by yourself.  Do not do any activities that involve climbing or being in high places.  Do not go into any body of water unless there is an adult nearby who can watch you and help you if needed. This includes: ? Lakes. ? Rivers. ? Oceans. ? Spas. ? Swimming pools.  Keep others safe  Store your medicine as told by your doctor. Keep it where children and pets cannot reach it.  Do not share your pain medicine with anyone.  Do not save any leftover pills. If you have leftover pills, you can: ? Bring them to a take-back program. ? Bring them to a pharmacy that has a drug disposal container. ? Throw them in the trash. Check the medicine label or package insert to see if it is safe to throw it out. If it is safe, take the medicine out of the container. Mix it with something that makes it unusable, such as pet waste. Then put the medicine in the trash. General instructions  Talk with your doctor about other ways to manage your pain.  If you have trouble pooping: ? Drink enough fluid to keep your pee (urine) pale yellow. ? Use a poop (stool) softener as told by your doctor. ? Eat more fruits and vegetables.  Keep all follow-up visits as told by your doctor. This is important. Contact a   doctor if:  Your medicine is not helping with your pain.  You have a rash.  You feel depressed. Get help right away if: Seek medical care right away if you are taking pain medicines and you (or people close to you) notice any of the following:  Trouble breathing.  Breathing that is shorter than normal.  Breathing that is more shallow than normal.  Confusion.  Sleepiness.  Trouble staying awake.  Feeling sick to your stomach.  Throwing up.  Your skin or lips turning pale or bluish in color.  Tongue swelling. If you ever  feel like you may hurt yourself or others, or have thoughts about taking your own life, get help right away. Go to your nearest emergency department or call:  Your local emergency services (911 in the [place]  A suicide crisis helpline, such as the National Suicide Prevention Lifeline at 1-800-273-8255. This is open 24 hours a day. Summary  Take your pain medicine exactly as told by your doctor.  Pain medicine can help lower your pain. It may also cause side effects.  Talk with your doctor about other ways to manage your pain.  Follow your doctor's instructions about how to take your pain medicine and keep others safe. Ask what activities you should avoid while taking pain medicine. This information is not intended to replace advice given to you by your health care provider. Make sure you discuss any questions you have with your health care provider. Document Revised: 02/27/2017 Document Reviewed: 10/27/2016 [employer] Patient Education  2020 [employer]  

## 2019-06-09 NOTE — Progress Notes (Signed)
Subjective:    Patient ID: [name], female    DOB: [birth year], 64 y.o.   MRN: [id number]   Chief Complaint: Medical management of chronic issues   HPI:  1. Essential hypertension Checks BP at home. Fluctuates between high and low. Has chest pain, SOB, dizziness but this is common and has not worsened. BP Readings from Last 3 Encounters:  05/02/19 (!) 138/97  04/17/19 (!) 178/94  03/17/19 (!) 179/117     2. Chronic diastolic CHF (congestive heart failure) ([place]) Followed by cardiology. Last visit in January 2021. Next appointment is in April 2021.  3. Obstructive sleep apnea syndrome Does not have CPAP machine. Has not gone back to see doctor to get machine yet. Has not been sleeping well. States granddaughter wakes her up if she has trouble breathing in her sleep.  4. Gastroesophageal reflux disease with esophagitis without hemorrhage Says reflux has been better. Has been watching what she eats and taking her medications and it has helped.  5. Sigmoid diverticulitis Admitted to hospital for diverticulitis in January 2021. Has been better and has changed diet to help with symptoms. Denies abdominal pain at this time.  6. Lumbosacral spondylosis without myelopathy Constant pain in back. Sciatica has been worse lately and right shoulder has limited motion. Says pain medicine helps but pain is still present. Has appointment with orthopedic doctor on Monday.  7. Age-related osteoporosis without current pathological fracture Lost balance and fell in the tub last week. Last Dexa scan January 2019. T-score -0.7.  8. GAD (generalized anxiety disorder) States anxiety has been up and down.  GAD 7 : Generalized Anxiety Score 06/30/2017 12/15/2016 11/13/2015 06/05/2015  Nervous, Anxious, on Edge 0 0 1 3  Control/stop worrying 0 0 3 3  Worry too much - different things 0 0 3 3  Trouble relaxing 0 0 3 3  Restless 0 0 3 3  Easily annoyed or irritable 0 0 3 3  Afraid - awful might  happen 0 0 0 0  Total GAD 7 Score 0 0 16 18  Anxiety Difficulty - Not difficult at all Somewhat difficult -      9. Herpes simplex virus (HSV) infection Had an outbreak 2 weeks ago but is better now. Last outbreak before that was a year ago.  10. Hyperlipidemia with target LDL less than 100 Watching fat and cholesterol intake. Not able to exercise currently because of pain in hip and right shoulder. Lab Results  Component Value Date   CHOL 186 03/08/2019   HDL 57 03/08/2019   LDLCALC 98 03/08/2019   LDLDIRECT 142.1 04/22/2007   TRIG 180 (H) 03/08/2019   CHOLHDL 3.3 03/08/2019     11. Primary insomnia Takes trazodone and 3 tylenol Pms every night and does help her to sleep.  12. Pre-diabetes Has been watching carbs and sweets in diet. Lab Results  Component Value Date   HGBA1C 6.4 03/08/2019     13. Recurrent major depressive disorder, in partial remission (HCC) No symptoms as long as taking medication. PHQ9 SCORE ONLY 05/02/2019 03/08/2019 12/16/2018  Score 0 0 0     14. Seizures ([place]) Last seizure was last year. Keppra is working.  15. Overweight (BMI 25.0-29.9) No significant changes in weight.  BMI Readings from Last 3 Encounters:  05/02/19 26.70 kg/m  04/21/19 25.61 kg/m  04/14/19 27.44 kg/m   Wt Readings from Last 3 Encounters:  05/02/19 146 lb (66.2 kg)  04/21/19 140 lb (63.5 kg)  04/14/19 150  lb (68 kg)    Pain assessment: Cause of pain- back pain Pain location- lower back Pain on scale of 1-10- 5/10 currently Frequency- daily What increases pain-nothing increases pain What makes pain Better-pain meds help Effects on ADL - none Any change in general medical condition-none  Current opioids rx- norco 7.5/325 bid # meds rx- 60 Effectiveness of current meds-helps Adverse reactions form pain meds-none Morphine equivalent- 20 MEDD  Pill count performed-No Last drug screen - 09/2018 ( high risk q92m moderate risk q675mlow risk yearly ) Urine  drug screen today- Yes [place]  If yes were their any concerning findings? - none   Overdose risk: 10   Pain contract signed on:03/2019    Outpatient Encounter Medications as of 06/09/2019  Medication Sig  . acyclovir ointment (ZOVIRAX) 5 % APPLY OINTMENT TO AFFECTED AREA EVERY 3 HOURS. (Patient taking differently: Apply 1 application topically daily as needed. )  . albuterol (PROVENTIL) (2.5 MG/3ML) 0.083% nebulizer solution USE 1 VIAL IN NEBULIZER EVERY 4 HOURS AS NEEDED FOR WHEEZING. (Patient taking differently: Take 2.5 mg by nebulization every 4 (four) hours as needed for wheezing. )  . albuterol (VENTOLIN HFA) 108 (90 Base) MCG/ACT inhaler INHALE 2 PUFFS EVERY 6 HOURS AS NEEDED FOR SHORTNESS OF BREATH AND WHEEZING. (Patient taking differently: Inhale 2 puffs into the lungs every 6 (six) hours as needed for wheezing or shortness of breath. )  . Alcohol Swabs (GLOBAL ALCOHOL PREP EASE) 70 % PADS USE 1 PAD DAILY WHEN CHECKING BLOOD SUGAR. Dx R73.03  . busPIRone (BUSPAR) 10 MG tablet TAKE 1 TABLET BY MOUTH TWICE DAILY AS NEEDED.  . [place] Kitchenutalbital-acetaminophen-caffeine (FIORICET) 50-325-40 MG tablet Take 1 tablet by mouth every 6 (six) hours as needed for headache.  . carvedilol (COREG) 6.25 MG tablet TAKE 1 & 1/2 TABLETS BY MOUTH TWICE DAILY.  . [place] KitchenEXILANT 60 MG capsule TAKE 1 CAPSULE DAILY.  . DULoxetine (CYMBALTA) 60 MG capsule TAKE 1 CAPSULE BY MOUTH AT BEDTIME.  . [place] Kitchenscitalopram (LEXAPRO) 20 MG tablet TAKE 1 TABLET ONCE DAILY.  . [place] Kitchenstradiol (ESTRACE) 0.1 MG/GM vaginal cream PLACE 1 APPLICATORFUL VAGINALLY AT BEDTIME.  . fludrocortisone (FLORINEF) 0.1 MG tablet Take 1 tablet (0.1 mg total) by mouth every other day.  . fluticasone (FLONASE) 50 MCG/ACT nasal spray SPRAY 2 SPRAYS IN EACH NOSTRIL ONCE DAILY. (Patient taking differently: Place 1 spray into both nostrils daily as needed for allergies or rhinitis. )  . fluticasone furoate-vilanterol (BREO ELLIPTA) 200-25 MCG/INH  AEPB Inhale 1 puff into the lungs daily.  . furosemide (LASIX) 40 MG tablet Take 1 tablet (40 mg total) by mouth daily as needed for fluid. (Patient taking differently: Take 40 mg by mouth daily as needed for fluid. Patient takes based on blood pressure levels)  . HYDROcodone-acetaminophen (NORCO) 7.5-325 MG tablet Take 1 tablet by mouth every 6 (six) hours as needed for moderate pain.  . [place] Kitchenpratropium (ATROVENT) 0.02 % nebulizer solution Take 2.5 mLs (0.5 mg total) by nebulization every 4 (four) hours as needed for wheezing.  . isosorbide mononitrate (IMDUR) 30 MG 24 hr tablet Take 1 tablet (30 mg total) by mouth daily.  . [place] KitchenamoTRIgine (LAMICTAL) 150 MG tablet TAKE 1 TABLET BY MOUTH ONCE DAILY.  . [place] Kitchenancets (ONETOUCH DELICA PLUS [id number] USE TO CHECK SUGAR DAILY.  . Marland KitchenevETIRAcetam (KEPPRA) 500 MG tablet TAKE (1) TABLET TWICE DAILY. (Patient taking differently: Take 500 mg by mouth 2 (two) times daily. )  . levocetirizine (XYZAL) 5 MG tablet  TAKE 1 TABLET BY MOUTH EVERY MORNING. (Patient taking differently: Take 5 mg by mouth daily as needed for allergies. )  . lidocaine (XYLOCAINE) 5 % ointment APPLY AFFECTED AREA THREE TIMES DAILY AS NEEDED FOR MILD OR MODERATE PAIN. (Patient taking differently: Apply 1 application topically 3 (three) times daily as needed for mild pain or moderate pain. )  . nitroGLYCERIN (NITROSTAT) 0.4 MG SL tablet PLACE ONE (1) TABLET UNDER TONGUE EVERY 5 MINUTES UP TO (3) DOSES AS NEEDED FOR CHEST PAIN.  [name] ondansetron (ZOFRAN) 4 MG tablet TAKE 1 TABLET BY MOUTH EVERY 8 HOURS AS NEEDED FOR NAUSEA AND VOMITING. (Patient taking differently: Take 4 mg by mouth every 8 (eight) hours as needed for nausea or vomiting. )  . oxybutynin (DITROPAN) 5 MG tablet Take 1 tablet (5 mg total) by mouth 2 (two) times daily. (Patient taking differently: Take 5 mg by mouth at bedtime. )  . rosuvastatin (CRESTOR) 10 MG tablet TAKE 1 TABLET DAILY.  [name] sucralfate (CARAFATE) 1 g tablet Take 1 tablet (1  g total) by mouth 4 (four) times daily -  with meals and at bedtime.  [name] tiZANidine (ZANAFLEX) 4 MG tablet TAKE (1) TABLET EVERY SIX HOURS AS NEEDED FOR MUSCLE SPASMS.  [name] traZODone (DESYREL) 150 MG tablet TAKE 1 TABLET BY MOUTH AT BEDTIME.  [name] TRELEGY ELLIPTA 100-62.5-25 MCG/INH AEPB INHALE 1 PUFF INTO LUNGS DAILY.  . valACYclovir (VALTREX) 500 MG tablet Take 1 tablet (500 mg total) by mouth 2 (two) times daily.   No facility-administered encounter medications on file as of 06/09/2019.    Past Surgical History:  Procedure Laterality Date  . BACK SURGERY    . BREAST SURGERY     lumpectomy  . CATARACT EXTRACTION W/PHACO Right 07/31/2017   Procedure: CATARACT EXTRACTION PHACO AND INTRAOCULAR LENS PLACEMENT (IOC);  Surgeon: [name], MD;  Location: [facility];  Service: Ophthalmology;  Laterality: Right;  CDE: 2.33  . CATARACT EXTRACTION W/PHACO Left 08/14/2017   Procedure: CATARACT EXTRACTION PHACO AND INTRAOCULAR LENS PLACEMENT (IOC);  Surgeon: [name], MD;  Location: [facility];  Service: Ophthalmology;  Laterality: Left;  CDE: 2.74  . CHOLECYSTECTOMY    . CYSTOSCOPY     stone  . DOPPLER ECHOCARDIOGRAPHY  2009  . head up tilt table testing  06/15/2007   [name]  . HEMORRHOID SURGERY    . insertion of implatable loop recorder  08/11/2007   [name]  . POSTERIOR CERVICAL FUSION/FORAMINOTOMY N/A 12/19/2013   Procedure: RIGHT C3-4.C4-5 AND C5-6 FORAMINOTOMIES;  Surgeon: [name], MD;  Location: [facility];  Service: Orthopedics;  Laterality: N/A;  . TUBAL LIGATION      Family History  Problem Relation Age of Onset  . Heart attack Father   . Cancer Father   . Mental illness Father   . Cancer Mother   . Mental illness Mother   . Heart attack Brother        stents  . Alcohol abuse Brother   . Heart disease Brother   . Drug abuse Brother   . Diabetes Brother   . Colon cancer Maternal Aunt   . Cirrhosis Brother   . Stomach cancer Neg Hx     New  complaints: None   Social history: Lives alone. Granddaughter stays with her a lot and helps her. Has a lot of extended family that she stays in frequent contact with.   Controlled substance contract: Signed     Review of Systems  Constitutional: Negative.  HENT: Negative.   Eyes: Negative.   Respiratory: Positive for shortness of breath.   Cardiovascular: Positive for chest pain.  Gastrointestinal: Negative.   Endocrine: Positive for heat intolerance.  Genitourinary: Negative.   Musculoskeletal: Positive for back pain (and sciatica).  Skin: Negative.   Allergic/Immunologic: Negative.   Neurological: Positive for dizziness.  Hematological: Negative.   Psychiatric/Behavioral: Negative.        Objective:   Physical Exam Vitals and nursing note reviewed.  HENT:     Head: Normocephalic.     Right Ear: Tympanic membrane normal.     Left Ear: Tympanic membrane normal.     Nose: Nose normal.     Mouth/Throat:     Mouth: Mucous membranes are moist.     Pharynx: Oropharynx is clear.  Eyes:     Conjunctiva/sclera: Conjunctivae normal.     Pupils: Pupils are equal, round, and reactive to light.  Cardiovascular:     Rate and Rhythm: Normal rate and regular rhythm.     Pulses: Normal pulses.     Heart sounds: Normal heart sounds.  Pulmonary:     Effort: Pulmonary effort is normal.     Breath sounds: Normal breath sounds.  Abdominal:     General: Bowel sounds are normal.     Palpations: Abdomen is soft.  Musculoskeletal:     Right shoulder: Tenderness present. Decreased range of motion.     Cervical back: Normal range of motion and neck supple.  Skin:    General: Skin is warm and dry.     Capillary Refill: Capillary refill takes less than 2 seconds.     Comments: Large hemangioma on left upper arm  Neurological:     General: No focal deficit present.     Mental Status: She is oriented to person, place, and time.  Psychiatric:        Mood and Affect: Mood normal.         Behavior: Behavior normal.      BP 119/74   Pulse (!) 101   Temp 98.7 F (37.1 C) (Temporal)   Resp 20   Ht 5' 2"  (1.575 m)   Wt 146 lb (66.2 kg)   SpO2 98%   BMI 26.70 kg/m   HGBA1c 6.0    Assessment & Plan:  NEEKA URISTA comes in today with chief complaint of Medical Management of Chronic Issues   Diagnosis and orders addressed:  1. Essential hypertension Check BP regularly.  - CMP14+EGFR - CBC with Differential/Platelet - furosemide (LASIX) 40 MG tablet; Take 1 tablet (40 mg total) by mouth daily as needed for fluid.  Dispense: 90 tablet; Refill: 1  2. Chronic diastolic CHF (congestive heart failure) ([place]) Follow up with cardiology  3. Obstructive sleep apnea syndrome Get CPAP machine as soon as possible for sleep.  4. Gastroesophageal reflux disease with esophagitis without hemorrhage Avoid spicy foods Do not eat 2 hours prior to bedtime  5. Sigmoid diverticulitis Diet low in seeds and indigestible fruit skins.  6. Lumbosacral spondylosis without myelopathy - ToxASSURE Select 13 (MW), Urine  7. Age-related osteoporosis without current pathological fracture  8. GAD (generalized anxiety disorder) Stress management.  9. Herpes simplex virus (HSV) infection - valACYclovir (VALTREX) 500 MG tablet; Take 1 tablet (500 mg total) by mouth 2 (two) times daily.  Dispense: 180 tablet; Refill: 1  10. Hyperlipidemia with target LDL less than 100 Low fat/low cholesterol diets. Weight-bearing exercise as tolerated. - Lipid panel - rosuvastatin (CRESTOR) 10 MG  tablet; Take 1 tablet (10 mg total) by mouth daily.  Dispense: 90 tablet; Refill: 1  11. Primary insomnia Sleep hygiene techniques. - traZODone (DESYREL) 150 MG tablet; Take 1 tablet (150 mg total) by mouth at bedtime.  Dispense: 90 tablet; Refill: 1  12. Pre-diabetes Watch carb and sugar intake. - Bayer DCA Hb A1c Waived  13. Recurrent major depressive disorder, in partial remission  ([place]) Stress management  14. Seizures (HCC) - levETIRAcetam (KEPPRA) 500 MG tablet; TAKE (1) TABLET TWICE DAILY.  Dispense: 180 tablet; Refill: 1  15. Overweight (BMI 25.0-29.9) Discussed diet and exercise for person with BMI >25 Will recheck weight in 3-6 months  16. Allergic rhinitis, unspecified seasonality, unspecified trigger - fluticasone furoate-vilanterol (BREO ELLIPTA) 200-25 MCG/INH AEPB; Inhale 1 puff into the lungs daily.  Dispense: 60 each; Refill: 5  17. Anxiety state Stress management techniques. - busPIRone (BUSPAR) 10 MG tablet; Take 1 tablet (10 mg total) by mouth 2 (two) times daily as needed.  Dispense: 60 tablet; Refill: 3  18. Chronic tension-type headache, not intractable - butalbital-acetaminophen-caffeine (FIORICET) 50-325-40 MG tablet; Take 1 tablet by mouth every 6 (six) hours as needed for headache.  Dispense: 20 tablet; Refill: 1  19. Gastroesophageal reflux disease with esophagitis Avoid spicy foods Do not eat 2 hours prior to bedtime - sucralfate (CARAFATE) 1 g tablet; Take 1 tablet (1 g total) by mouth 4 (four) times daily -  with meals and at bedtime.  Dispense: 90 tablet; Refill: 1 - dexlansoprazole (DEXILANT) 60 MG capsule; Take 1 capsule (60 mg total) by mouth daily.  Dispense: 90 capsule; Refill: 1  20. Herpes simplex vulvovaginitis - valACYclovir (VALTREX) 500 MG tablet; Take 1 tablet (500 mg total) by mouth 2 (two) times daily.  Dispense: 180 tablet; Refill: 1  21. Moderate episode of recurrent major depressive disorder (HCC) - DULoxetine (CYMBALTA) 60 MG capsule; Take 1 capsule (60 mg total) by mouth at bedtime.  Dispense: 90 capsule; Refill: 1  22. Unstable angina (HCC) Follow up with cardiology - isosorbide mononitrate (IMDUR) 30 MG 24 hr tablet; Take 1 tablet (30 mg total) by mouth daily.  Dispense: 90 tablet; Refill: 1  23. Cervical spondylosis without myelopathy Follow up with orthopedic doctor. - HYDROcodone-acetaminophen (NORCO)  7.5-325 MG tablet; Take 1 tablet by mouth in the morning and at bedtime.  Dispense: 60 tablet; Refill: 0   Labs pending Health Maintenance reviewed Diet and exercise encouraged  Follow up plan: 3 months pain management   [name] Hassell Done, FNP

## 2019-06-10 LAB — CMP14+EGFR
ALT: 9 IU/L (ref 0–32)
AST: 18 IU/L (ref 0–40)
Albumin/Globulin Ratio: 1.8 (ref 1.2–2.2)
Albumin: 4.4 g/dL (ref 3.8–4.8)
Alkaline Phosphatase: 97 IU/L (ref 39–117)
BUN/Creatinine Ratio: 15 (ref 12–28)
BUN: 11 mg/dL (ref 8–27)
Bilirubin Total: <0.2 mg/dL (ref 0.0–1.2)
CO2: 25 mmol/L (ref 20–29)
Calcium: 10.1 mg/dL (ref 8.7–10.3)
Chloride: 102 mmol/L (ref 96–106)
Creatinine, Ser: 0.72 mg/dL (ref 0.57–1.00)
GFR calc Af Amer: 104 mL/min/{1.73_m2} (ref >59)
GFR calc non Af Amer: 90 mL/min/{1.73_m2} (ref >59)
Globulin, Total: 2.5 g/dL (ref 1.5–4.5)
Glucose: 122 mg/dL — ABNORMAL HIGH (ref 65–99)
Potassium: 4.8 mmol/L (ref 3.5–5.2)
Sodium: 143 mmol/L (ref 134–144)
Total Protein: 6.9 g/dL (ref 6.0–8.5)

## 2019-06-10 LAB — LIPID PANEL
Chol/HDL Ratio: 4.3 ratio (ref 0.0–4.4)
Cholesterol, Total: 207 mg/dL — ABNORMAL HIGH (ref 100–199)
HDL: 48 mg/dL (ref >39)
LDL Chol Calc (NIH): 116 mg/dL — ABNORMAL HIGH (ref 0–99)
Triglycerides: 249 mg/dL — ABNORMAL HIGH (ref 0–149)
VLDL Cholesterol Cal: 43 mg/dL — ABNORMAL HIGH (ref 5–40)

## 2019-06-10 LAB — CBC WITH DIFFERENTIAL/PLATELET
Basophils Absolute: 0.1 10*3/uL (ref 0.0–0.2)
Basos: 1 %
EOS (ABSOLUTE): 0.2 10*3/uL (ref 0.0–0.4)
Eos: 2 %
Hematocrit: 39.9 % (ref 34.0–46.6)
Hemoglobin: 11.9 g/dL (ref 11.1–15.9)
Immature Grans (Abs): 0 10*3/uL (ref 0.0–0.1)
Immature Granulocytes: 0 %
Lymphocytes Absolute: 3.7 10*3/uL — ABNORMAL HIGH (ref 0.7–3.1)
Lymphs: 44 %
MCH: 24.1 pg — ABNORMAL LOW (ref 26.6–33.0)
MCHC: 29.8 g/dL — ABNORMAL LOW (ref 31.5–35.7)
MCV: 81 fL (ref 79–97)
Monocytes Absolute: 0.7 10*3/uL (ref 0.1–0.9)
Monocytes: 8 %
Neutrophils Absolute: 3.8 10*3/uL (ref 1.4–7.0)
Neutrophils: 45 %
Platelets: 284 10*3/uL (ref 150–450)
RBC: 4.94 x10E6/uL (ref 3.77–5.28)
RDW: 16.3 % — ABNORMAL HIGH (ref 11.7–15.4)
WBC: 8.4 10*3/uL (ref 3.4–10.8)

## 2019-06-13 ENCOUNTER — Ambulatory Visit: Payer: Self-pay

## 2019-06-13 ENCOUNTER — Ambulatory Visit (INDEPENDENT_AMBULATORY_CARE_PROVIDER_SITE_OTHER): Payer: 59 | Admitting: Specialist

## 2019-06-13 ENCOUNTER — Other Ambulatory Visit: Payer: Self-pay

## 2019-06-13 ENCOUNTER — Encounter: Payer: Self-pay | Admitting: Specialist

## 2019-06-13 VITALS — BP 125/84 | HR 77 | Ht 62.0 in | Wt 146.0 lb

## 2019-06-13 DIAGNOSIS — M545 Low back pain, unspecified: Secondary | ICD-10-CM

## 2019-06-13 DIAGNOSIS — M1612 Unilateral primary osteoarthritis, left hip: Secondary | ICD-10-CM | POA: Diagnosis not present

## 2019-06-13 DIAGNOSIS — M25511 Pain in right shoulder: Secondary | ICD-10-CM

## 2019-06-13 DIAGNOSIS — S22080S Wedge compression fracture of T11-T12 vertebra, sequela: Secondary | ICD-10-CM

## 2019-06-13 DIAGNOSIS — M7581 Other shoulder lesions, right shoulder: Secondary | ICD-10-CM

## 2019-06-13 DIAGNOSIS — M778 Other enthesopathies, not elsewhere classified: Secondary | ICD-10-CM | POA: Diagnosis not present

## 2019-06-13 DIAGNOSIS — M19011 Primary osteoarthritis, right shoulder: Secondary | ICD-10-CM | POA: Diagnosis not present

## 2019-06-13 DIAGNOSIS — G8929 Other chronic pain: Secondary | ICD-10-CM

## 2019-06-13 DIAGNOSIS — M7521 Bicipital tendinitis, right shoulder: Secondary | ICD-10-CM

## 2019-06-13 LAB — TOXASSURE SELECT 13 (MW), URINE

## 2019-06-13 MED ORDER — BUPIVACAINE HCL 0.25 % IJ SOLN
4.0000 mL | INTRAMUSCULAR | Status: AC | PRN
  Administered 2019-06-13: 4 mL via INTRA_ARTICULAR

## 2019-06-13 MED ORDER — CELECOXIB 200 MG PO CAPS: ORAL_CAPSULE | ORAL | 0 refills | Status: DC

## 2019-06-13 MED ORDER — METHYLPREDNISOLONE ACETATE 40 MG/ML IJ SUSP
40.0000 mg | INTRAMUSCULAR | Status: AC | PRN
  Administered 2019-06-13: 40 mg via INTRA_ARTICULAR

## 2019-06-13 MED ORDER — DICLOFENAC SODIUM 1 % EX GEL: 4.0000 g | Freq: Four times a day (QID) | CUTANEOUS | 3 refills | Status: DC

## 2019-06-13 MED ORDER — CHLORZOXAZONE 500 MG PO TABS: 500.0000 mg | ORAL_TABLET | Freq: Four times a day (QID) | ORAL | 0 refills | Status: DC | PRN

## 2019-06-13 NOTE — Progress Notes (Signed)
Office Visit Note   Patient: [name]           Date of Birth: October 27, 1955           MRN: [id number] Visit Date: 06/13/2019              Requested by: Bennie Pierini, FNP [address] [place],  [place] [address] PCP: Bennie Pierini, FNP   Assessment & Plan: Visit Diagnoses:  1. Low back pain, unspecified back pain laterality, unspecified chronicity, unspecified whether sciatica present   2. Chronic right shoulder pain   3. Bicipital tendonitis of right shoulder   4. Right shoulder tendonitis   5. Primary osteoarthritis, right shoulder   6. Unilateral primary osteoarthritis, left hip   7. Compression fracture of T12 vertebra, sequela     Plan: Avoid frequent bending and stooping  No lifting greater than 10 lbs. May use ice or moist heat for pain. Weight loss is of benefit. Best medication for lumbar disc disease is arthritis medications like motrin, celebrex and naprosyn. Exercise is important to improve your indurance and does allow people to function better inspite of back pain. Parafon forte for pain Celebrex 200 mg po daily for one week then increase to BID with meal or snack Diclofenac gel to the right shoulder up to qid for inflamation Referral to therapy at [facility] for right shoulder ROM, stretching and strengthening and iontophoresis.   Follow-Up Instructions: No follow-ups on file.   Orders:  Orders Placed This Encounter  Procedures  . XR Lumbar Spine 2-3 Views  . XR Shoulder Right   No orders of the defined types were placed in this encounter.     Procedures: Large Joint Inj: R subacromial bursa on 06/13/2019 3:13 PM Indications: pain Details: 25 G 1.5 in needle, posterior approach  Arthrogram: No  Medications: 40 mg methylPREDNISolone acetate 40 MG/ML; 4 mL bupivacaine 0.25 % Outcome: tolerated well, no immediate complications  Bandaid applied, pain anteriorly persisted and an right bicipital tendon groove injection was then  performed with good relief.  Procedure, treatment alternatives, risks and benefits explained, specific risks discussed. Consent was given by the patient. Immediately prior to procedure a time out was called to verify the correct patient, procedure, equipment, support staff and site/side marked as required. Patient was prepped and draped in the usual sterile fashion.       Clinical Data: No additional findings.   Subjective: Chief Complaint  Patient presents with  . Lower Back - Pain  . Right Shoulder - Pain    64 year old female right handed. She has history of hard fall down 12 steps landing on her tailbone and hit a wall. Mary with [facility] has stopped prescribing  tizanidine and gabapentin and lyrica. She reports pain in the right shoulder since last October or September when a  Large dog a bully dog jumped on her lap then went to jump off and pushed down on her right shoulder with pain and difficulty using the right arm over head. She is weak in raising the right arm. She has been told by her daughter that the right shoulder is dislocated. There was numbness and tingling into the right arm and down into the finger when the dog pressed down on ber. Pain with touching the right elbow. Blood sugar is going up.   Review of Systems  Constitutional: Positive for unexpected weight change (Hospitalized for abdomenal pain, lost 17 lbs, had to change diet.). Negative for activity change, appetite  change, chills, diaphoresis, fatigue and fever.  HENT: Negative.  Negative for congestion, dental problem, drooling, ear discharge, ear pain, facial swelling, hearing loss, mouth sores, nosebleeds, postnasal drip, rhinorrhea, sinus pressure, sinus pain, sneezing, sore throat, tinnitus, trouble swallowing and voice change.   Eyes: Negative.  Negative for photophobia, pain, discharge, redness, itching and visual disturbance.  Respiratory: Positive for chest tightness, shortness of breath and wheezing.  Negative for apnea, cough, choking and stridor.   Cardiovascular: Positive for chest pain. Negative for palpitations and leg swelling.  Gastrointestinal: Positive for abdominal distention and abdominal pain. Negative for anal bleeding, blood in stool, constipation, diarrhea, nausea, rectal pain and vomiting.  Endocrine: Negative.  Negative for cold intolerance, heat intolerance, polydipsia, polyphagia and polyuria.  Genitourinary: Negative.  Negative for difficulty urinating, dyspareunia, dysuria, enuresis, flank pain and hematuria.  Musculoskeletal: Positive for back pain, neck pain and neck stiffness. Negative for arthralgias, gait problem, joint swelling and myalgias.  Skin: Negative.  Negative for color change, pallor, rash and wound.  Allergic/Immunologic: Positive for environmental allergies. Negative for food allergies and immunocompromised state.  Neurological: Positive for dizziness, weakness, light-headedness, numbness and headaches. Negative for tremors, seizures, syncope, facial asymmetry and speech difficulty.  Hematological: Negative.   Psychiatric/Behavioral: Negative.  Negative for agitation, behavioral problems, confusion, decreased concentration, dysphoric mood, hallucinations, self-injury, sleep disturbance and suicidal ideas. The patient is not nervous/anxious and is not hyperactive.      Objective: Vital Signs: BP 125/84 (BP Location: Left Arm, Patient Position: Sitting)   Pulse 77   Ht 5\' 2"  (1.575 m)   Wt 146 lb (66.2 kg)   BMI 26.70 kg/m   Physical Exam Constitutional:      Appearance: She is well-developed.  HENT:     Head: Normocephalic and atraumatic.  Eyes:     Pupils: Pupils are equal, round, and reactive to light.  Pulmonary:     Effort: Pulmonary effort is normal.     Breath sounds: Normal breath sounds.  Abdominal:     General: Bowel sounds are normal.     Palpations: Abdomen is soft.  Musculoskeletal:        General: Normal range of motion.      Cervical back: Normal range of motion and neck supple.  Skin:    General: Skin is warm and dry.  Neurological:     Mental Status: She is alert and oriented to person, place, and time.  Psychiatric:        Behavior: Behavior normal.        Thought Content: Thought content normal.        Judgment: Judgment normal.     Ortho Exam  Specialty Comments:  No specialty comments available.  Imaging: XR Lumbar Spine 2-3 Views  Result Date: 06/13/2019 AP and lateral flexion and extension radiographs of the lumbar spine show a 30% anterior wedge compression fracture T12, appears to date to 05/2017 when she had a fall down a flight of stairs. There is 4 mm retrolisthesis of L2 on L3. Minimal disc narrowing L3-4. Lordosis is maintained. The degree of lordosis across the T11 to L1 level is 14.8 degrees. Fusion L4-5 with TLIF and pedicle screws and rods, no loosening of hardware fusion apparent.     PMFS History: Patient Active Problem List   Diagnosis Date Noted  . Cervical spondylosis without myelopathy 12/19/2013    Priority: High    Class: Chronic  . Overweight (BMI 25.0-29.9) 04/15/2019  . Sigmoid diverticulitis 04/14/2019  . Chronic diastolic  CHF (congestive heart failure) (HCC) 04/14/2019  . Chest pain 01/17/2019  . Unstable angina (HCC) 10/20/2017  . Chronic pain 09/23/2016  . Pre-diabetes 09/02/2016  . Recurrent major depressive disorder, in partial remission (HCC) 07/14/2016  . Seizures (HCC) 07/14/2016  . GAD (generalized anxiety disorder) 07/14/2016  . Insomnia 09/05/2014  . Allergic rhinitis 09/05/2014  . Neural foraminal stenosis of cervical spine 12/19/2013  . Cervical radiculitis 09/19/2013  . Lumbosacral spondylosis without myelopathy 11/16/2012  . Postlaminectomy syndrome, lumbar region 11/16/2012  . Herpes simplex virus (HSV) infection 10/31/2008  . Hyperlipidemia with target LDL less than 100 10/31/2008  . Essential hypertension 10/31/2008  . GERD 10/31/2008  .  SPINAL STENOSIS OF LUMBAR REGION 10/31/2008  . Myalgia 10/31/2008  . Osteoporosis 10/31/2008  . SPONDYLOLISTHESIS 10/31/2008  . SYNCOPE 10/31/2008  . Sleep apnea 10/31/2008   Past Medical History:  Diagnosis Date  . Anginal pain (HCC)    last time   . Anxiety   . Arthritis    RHEUMATOID  . Asthma   . Bipolar 1 disorder (HCC)   . Cataracts, bilateral 07/2017  . COPD (chronic obstructive pulmonary disease) (HCC)   . Coronary artery disease    reported hx of "MI";  Echo 2009 with normal LVF;  Myoview 05/2011: no ischemia  . Depression   . Dyslipidemia   . Dysrhythmia    SVT  . Esophageal stricture   . Fibromyalgia   . GERD (gastroesophageal reflux disease)   . H/O hiatal hernia   . Head injury, unspecified   . Herpes simplex infection   . History of kidney stones   . Hyperlipidemia   . Hypertension   . Insomnia   . Myocardial infarction [facility])    age 88  . Osteoporosis   . Pneumonia    hx  . Seizures (HCC)    last 3 weeks ago- prescribed keppra -never took didnt like side effects read about online  . Shortness of breath   . Sleep apnea    ? neg  . Spinal stenosis of lumbar region   . Spondylolisthesis   . Status post placement of implantable loop recorder   . Supraventricular tachycardia (HCC)   . Syncope and collapse    s/p ILR; no arhythmogenic cause identified  . UTI (lower urinary tract infection)     Family History  Problem Relation Age of Onset  . Heart attack Father   . Cancer Father   . Mental illness Father   . Cancer Mother   . Mental illness Mother   . Heart attack Brother        stents  . Alcohol abuse Brother   . Heart disease Brother   . Drug abuse Brother   . Diabetes Brother   . Colon cancer Maternal Aunt   . Cirrhosis Brother   . Stomach cancer Neg Hx     Past Surgical History:  Procedure Laterality Date  . BACK SURGERY    . BREAST SURGERY     lumpectomy  . CATARACT EXTRACTION W/PHACO Right 07/31/2017   Procedure: CATARACT  EXTRACTION PHACO AND INTRAOCULAR LENS PLACEMENT (IOC);  Surgeon: Fabio Pierce, MD;  Location: [facility];  Service: Ophthalmology;  Laterality: Right;  CDE: 2.33  . CATARACT EXTRACTION W/PHACO Left 08/14/2017   Procedure: CATARACT EXTRACTION PHACO AND INTRAOCULAR LENS PLACEMENT (IOC);  Surgeon: Fabio Pierce, MD;  Location: [facility];  Service: Ophthalmology;  Laterality: Left;  CDE: 2.74  . CHOLECYSTECTOMY    . CYSTOSCOPY  stone  . DOPPLER ECHOCARDIOGRAPHY  2009  . head up tilt table testing  06/15/2007   [name]  . HEMORRHOID SURGERY    . insertion of implatable loop recorder  08/11/2007   [name]  . POSTERIOR CERVICAL FUSION/FORAMINOTOMY N/A 12/19/2013   Procedure: RIGHT C3-4.C4-5 AND C5-6 FORAMINOTOMIES;  Surgeon: Kerrin Champagne, MD;  Location: [facility];  Service: Orthopedics;  Laterality: N/A;  . TUBAL LIGATION     Social History   Occupational History  . Occupation: Disability    Comment: 15 years  Tobacco Use  . Smoking status: Never Smoker  . Smokeless tobacco: Never Used  Substance and Sexual Activity  . Alcohol use: No    Alcohol/week: 0.0 standard drinks  . Drug use: No  . Sexual activity: Not Currently

## 2019-06-13 NOTE — Patient Instructions (Signed)
Plan: Avoid frequent bending and stooping  No lifting greater than 10 lbs. May use ice or moist heat for pain. Weight loss is of benefit. Best medication for lumbar disc disease is arthritis medications like motrin, celebrex and naprosyn. Exercise is important to improve your indurance and does allow people to function better inspite of back pain. Parafon forte for pain Celebrex 200 mg po daily for one week then increase to BID with meal or snack Diclofenac gel to the right shoulder up to qid for inflamation Referral to therapy at [facility] for right shoulder ROM, stretching and strengthening and iontophoresis.

## 2019-06-27 ENCOUNTER — Other Ambulatory Visit: Payer: Self-pay | Admitting: Radiology

## 2019-06-27 MED ORDER — CHLORZOXAZONE 500 MG PO TABS: 500.0000 mg | ORAL_TABLET | Freq: Four times a day (QID) | ORAL | 0 refills | Status: DC | PRN

## 2019-07-04 ENCOUNTER — Other Ambulatory Visit: Payer: Self-pay | Admitting: Specialist

## 2019-07-04 ENCOUNTER — Other Ambulatory Visit: Payer: Self-pay | Admitting: Nurse Practitioner

## 2019-07-04 DIAGNOSIS — F411 Generalized anxiety disorder: Secondary | ICD-10-CM

## 2019-07-04 DIAGNOSIS — F331 Major depressive disorder, recurrent, moderate: Secondary | ICD-10-CM

## 2019-07-05 ENCOUNTER — Other Ambulatory Visit: Payer: Self-pay | Admitting: *Deleted

## 2019-07-05 MED ORDER — IPRATROPIUM BROMIDE 0.02 % IN SOLN: 0.5000 mg | RESPIRATORY_TRACT | 1 refills | Status: DC | PRN

## 2019-07-11 ENCOUNTER — Other Ambulatory Visit: Payer: Self-pay | Admitting: Radiology

## 2019-07-11 MED ORDER — CHLORZOXAZONE 500 MG PO TABS: 500.0000 mg | ORAL_TABLET | Freq: Four times a day (QID) | ORAL | 0 refills | Status: DC | PRN

## 2019-07-15 ENCOUNTER — Telehealth: Payer: Self-pay | Admitting: Orthopaedic Surgery

## 2019-07-15 NOTE — Telephone Encounter (Signed)
Patient aware it will be too soon for an injection

## 2019-07-15 NOTE — Telephone Encounter (Signed)
Patient called   She wanted to know if a shoulder injection could be done at her appointment on the 26th with [facility].  Call back: [phone]

## 2019-07-25 ENCOUNTER — Ambulatory Visit (INDEPENDENT_AMBULATORY_CARE_PROVIDER_SITE_OTHER): Payer: 59 | Admitting: Orthopaedic Surgery

## 2019-07-25 ENCOUNTER — Encounter: Payer: Self-pay | Admitting: Orthopaedic Surgery

## 2019-07-25 ENCOUNTER — Ambulatory Visit: Payer: Self-pay

## 2019-07-25 ENCOUNTER — Other Ambulatory Visit: Payer: Self-pay

## 2019-07-25 DIAGNOSIS — G8929 Other chronic pain: Secondary | ICD-10-CM

## 2019-07-25 DIAGNOSIS — M25511 Pain in right shoulder: Secondary | ICD-10-CM

## 2019-07-25 DIAGNOSIS — M1612 Unilateral primary osteoarthritis, left hip: Secondary | ICD-10-CM | POA: Diagnosis not present

## 2019-07-25 NOTE — Progress Notes (Signed)
Office Visit Note   Patient: [name]           Date of Birth: [birth year]           MRN: [id number] Visit Date: 07/25/2019              Requested by: Bennie Pierini, FNP [address] [place],  [place] [address] PCP: Bennie Pierini, FNP   Assessment & Plan: Visit Diagnoses:  1. Unilateral primary osteoarthritis, left hip   2. Chronic right shoulder pain     Plan: At this point I am uncertain as to the etiology of her right shoulder pain.  It certainly could be from a cervical issue.  I would like to have Dr. Alvester Morin try a steroid injection under fluoroscopy in the right shoulder joint but I would also like him to place a steroid injection in her left hip joint under fluoroscopy.  I did give her handout about hip replacement surgery and I do feel we are heading that route but will not least try 1 course of conservative treatment in the left hip given her continued sciatic symptoms as well.  She agrees with this treatment plan.  I will see her back in 3 weeks because by then hopefully she will have had injections in both areas.  Follow-Up Instructions: Return in about 3 weeks (around 08/15/2019).   Orders:  Orders Placed This Encounter  Procedures  . XR HIP UNILAT W OR W/O PELVIS 1V LEFT   No orders of the defined types were placed in this encounter.     Procedures: No procedures performed   Clinical Data: No additional findings.   Subjective: Chief Complaint  Patient presents with  . Left Hip - Pain  The patient sent by Dr. Otelia Sergeant to evaluate and treat left hip pain and known osteoarthritis of left hip.  She also has chronic right shoulder issues.  She does state that injections in her shoulder were somewhat helpful by Dr. Otelia Sergeant but she is reporting right shoulder pain that radiates down to past her elbow into the forearm.  Even her skin she states is painful to touch.  She does have low back pain and sciatic symptoms to the left side but does have  pain in the groin and around her left hip in general.  She has had trochanteric injections in both hips.  Both her shoulder pain and her hip pain are detrimentally affecting her mobility and her quality of life.  HPI  Review of Systems She does report chronic blood pressure and heart issues.  She is on a multitude of medications as a relates to this.  She is not on blood thinning medication.  Objective: Vital Signs: There were no vitals taken for this visit.  Physical Exam She is alert and oriented and in no acute distress Ortho Exam Examination of her left hip does show pain in the groin with internal and external rotation and some stiffness with rotation of the hip.  Her rotation is still full but very painful.  Her right hip exam is normal.  Her right shoulder is clinically well located but there is a lot of pain on portion of exam with just examining in the shoulder. Specialty Comments:  No specialty comments available.  Imaging: XR HIP UNILAT W OR W/O PELVIS 1V LEFT  Result Date: 07/25/2019 An AP pelvis and lateral left hip does show significant arthritic changes of the left hip.  The right hip is well-maintained in terms of  the joint space.  The left hip has joint space narrowing with flattening of the superior lateral femoral head.  There is para-articular osteophytes around the hip as well.  The significance is quite noticeable when comparing the 2 hips together.    PMFS History: Patient Active Problem List   Diagnosis Date Noted  . Overweight (BMI 25.0-29.9) 04/15/2019  . Sigmoid diverticulitis 04/14/2019  . Chronic diastolic CHF (congestive heart failure) ([place]) 04/14/2019  . Chest pain 01/17/2019  . Unstable angina ([place]) 10/20/2017  . Chronic pain 09/23/2016  . Pre-diabetes 09/02/2016  . Recurrent major depressive disorder, in partial remission ([place]) 07/14/2016  . Seizures ([place]) 07/14/2016  . GAD (generalized anxiety disorder) 07/14/2016  . Insomnia 09/05/2014  .  Allergic rhinitis 09/05/2014  . Cervical spondylosis without myelopathy 12/19/2013    Class: Chronic  . Neural foraminal stenosis of cervical spine 12/19/2013  . Cervical radiculitis 09/19/2013  . Lumbosacral spondylosis without myelopathy 11/16/2012  . Postlaminectomy syndrome, lumbar region 11/16/2012  . Herpes simplex virus (HSV) infection 10/31/2008  . Hyperlipidemia with target LDL less than 100 10/31/2008  . Essential hypertension 10/31/2008  . GERD 10/31/2008  . SPINAL STENOSIS OF LUMBAR REGION 10/31/2008  . Myalgia 10/31/2008  . Osteoporosis 10/31/2008  . SPONDYLOLISTHESIS 10/31/2008  . SYNCOPE 10/31/2008  . Sleep apnea 10/31/2008   Past Medical History:  Diagnosis Date  . Anginal pain ([place])    last time   . Anxiety   . Arthritis    RHEUMATOID  . Asthma   . Bipolar 1 disorder ([place])   . Cataracts, bilateral 07/2017  . COPD (chronic obstructive pulmonary disease) ([place])   . Coronary artery disease    reported hx of "MI";  Echo 2009 with normal LVF;  Myoview 05/2011: no ischemia  . Depression   . Dyslipidemia   . Dysrhythmia    SVT  . Esophageal stricture   . Fibromyalgia   . GERD (gastroesophageal reflux disease)   . H/O hiatal hernia   . Head injury, unspecified   . Herpes simplex infection   . History of kidney stones   . Hyperlipidemia   . Hypertension   . Insomnia   . Myocardial infarction [facility])    age 64  . Osteoporosis   . Pneumonia    hx  . Seizures ([place])    last 3 weeks ago- prescribed keppra -never took didnt like side effects read about online  . Shortness of breath   . Sleep apnea    ? neg  . Spinal stenosis of lumbar region   . Spondylolisthesis   . Status post placement of implantable loop recorder   . Supraventricular tachycardia ([place])   . Syncope and collapse    s/p ILR; no arhythmogenic cause identified  . UTI (lower urinary tract infection)     Family History  Problem Relation Age of Onset  . Heart attack Father   . Cancer Father    . Mental illness Father   . Cancer Mother   . Mental illness Mother   . Heart attack Brother        stents  . Alcohol abuse Brother   . Heart disease Brother   . Drug abuse Brother   . Diabetes Brother   . Colon cancer Maternal Aunt   . Cirrhosis Brother   . Stomach cancer Neg Hx     Past Surgical History:  Procedure Laterality Date  . BACK SURGERY    . BREAST SURGERY     lumpectomy  .  CATARACT EXTRACTION W/PHACO Right 07/31/2017   Procedure: CATARACT EXTRACTION PHACO AND INTRAOCULAR LENS PLACEMENT (IOC);  Surgeon: Fabio Pierce, MD;  Location: [facility];  Service: Ophthalmology;  Laterality: Right;  CDE: 2.33  . CATARACT EXTRACTION W/PHACO Left 08/14/2017   Procedure: CATARACT EXTRACTION PHACO AND INTRAOCULAR LENS PLACEMENT (IOC);  Surgeon: Fabio Pierce, MD;  Location: [facility];  Service: Ophthalmology;  Laterality: Left;  CDE: 2.74  . CHOLECYSTECTOMY    . CYSTOSCOPY     stone  . DOPPLER ECHOCARDIOGRAPHY  2009  . head up tilt table testing  06/15/2007   [name]  . HEMORRHOID SURGERY    . insertion of implatable loop recorder  08/11/2007   [name]  . POSTERIOR CERVICAL FUSION/FORAMINOTOMY N/A 12/19/2013   Procedure: RIGHT C3-4.C4-5 AND C5-6 FORAMINOTOMIES;  Surgeon: Kerrin Champagne, MD;  Location: [facility] OR;  Service: Orthopedics;  Laterality: N/A;  . TUBAL LIGATION     Social History   Occupational History  . Occupation: Disability    Comment: 15 years  Tobacco Use  . Smoking status: Never Smoker  . Smokeless tobacco: Never Used  Substance and Sexual Activity  . Alcohol use: No    Alcohol/week: 0.0 standard drinks  . Drug use: No  . Sexual activity: Not Currently

## 2019-07-26 ENCOUNTER — Other Ambulatory Visit: Payer: Self-pay

## 2019-07-26 ENCOUNTER — Ambulatory Visit: Payer: 59 | Attending: Specialist | Admitting: Physical Therapy

## 2019-07-26 DIAGNOSIS — R293 Abnormal posture: Secondary | ICD-10-CM | POA: Diagnosis present

## 2019-07-26 DIAGNOSIS — G8929 Other chronic pain: Secondary | ICD-10-CM

## 2019-07-26 DIAGNOSIS — M25511 Pain in right shoulder: Secondary | ICD-10-CM | POA: Insufficient documentation

## 2019-07-26 DIAGNOSIS — M6281 Muscle weakness (generalized): Secondary | ICD-10-CM | POA: Diagnosis present

## 2019-07-26 DIAGNOSIS — M1612 Unilateral primary osteoarthritis, left hip: Secondary | ICD-10-CM

## 2019-07-26 NOTE — Therapy (Signed)
[facility] [address] [place], [place], [address] Phone: [phone]   Fax:  [phone]  Physical Therapy Evaluation  Patient Details  Name: [name] MRN: [id number] Date of Birth: 30-Oct-1955 Referring Provider (PT): Hazeline Junker MD   Encounter Date: 07/26/2019  PT End of Session - 07/26/19 1154    Visit Number  1    Number of Visits  12    Date for PT Re-Evaluation  09/19/19    PT Start Time  1116    PT Stop Time  1150    PT Time Calculation (min)  34 min       Past Medical History:  Diagnosis Date  . Anginal pain (HCC)    last time   . Anxiety   . Arthritis    RHEUMATOID  . Asthma   . Bipolar 1 disorder (HCC)   . Cataracts, bilateral 07/2017  . COPD (chronic obstructive pulmonary disease) (HCC)   . Coronary artery disease    reported hx of "MI";  Echo 2009 with normal LVF;  Myoview 05/2011: no ischemia  . Depression   . Dyslipidemia   . Dysrhythmia    SVT  . Esophageal stricture   . Fibromyalgia   . GERD (gastroesophageal reflux disease)   . H/O hiatal hernia   . Head injury, unspecified   . Herpes simplex infection   . History of kidney stones   . Hyperlipidemia   . Hypertension   . Insomnia   . Myocardial infarction [facility])    age 64  . Osteoporosis   . Pneumonia    hx  . Seizures (HCC)    last 3 weeks ago- prescribed keppra -never took didnt like side effects read about online  . Shortness of breath   . Sleep apnea    ? neg  . Spinal stenosis of lumbar region   . Spondylolisthesis   . Status post placement of implantable loop recorder   . Supraventricular tachycardia (HCC)   . Syncope and collapse    s/p ILR; no arhythmogenic cause identified  . UTI (lower urinary tract infection)     Past Surgical History:  Procedure Laterality Date  . BACK SURGERY    . BREAST SURGERY     lumpectomy  . CATARACT EXTRACTION W/PHACO Right 07/31/2017   Procedure: CATARACT EXTRACTION PHACO AND INTRAOCULAR  LENS PLACEMENT (IOC);  Surgeon: Fabio Pierce, MD;  Location: [facility];  Service: Ophthalmology;  Laterality: Right;  CDE: 2.33  . CATARACT EXTRACTION W/PHACO Left 08/14/2017   Procedure: CATARACT EXTRACTION PHACO AND INTRAOCULAR LENS PLACEMENT (IOC);  Surgeon: Fabio Pierce, MD;  Location: [facility];  Service: Ophthalmology;  Laterality: Left;  CDE: 2.74  . CHOLECYSTECTOMY    . CYSTOSCOPY     stone  . DOPPLER ECHOCARDIOGRAPHY  2009  . head up tilt table testing  06/15/2007   [name]  . HEMORRHOID SURGERY    . insertion of implatable loop recorder  08/11/2007   [name]  . POSTERIOR CERVICAL FUSION/FORAMINOTOMY N/A 12/19/2013   Procedure: RIGHT C3-4.C4-5 AND C5-6 FORAMINOTOMIES;  Surgeon: Kerrin Champagne, MD;  Location[facility] OR;  Service: Orthopedics;  Laterality: N/A;  . TUBAL LIGATION      There were no vitals filed for this visit.   Subjective Assessment - 07/26/19 1220    Subjective  COVID-19 screen performed prior to patient entering clinic.  The patient reports about a year ago she experienced a dog related injury to her right shoulder. She  reports a pain-level of 10/10 today.  She reports an inability to raise her right arm overhead.  She also reports pain radiating down her right UE.  She has found nothing has really helped her though she prefers cold over heat.  Movement increases her right shoulder pain.    Pertinent History  OP, cervical and lumbar surgery; Pacemaker, fibromyalgia, neck surgery, MI.    Patient Stated Goals  Use right UE without much pain.    Currently in Pain?  Yes    Pain Score  10-Worst pain ever    Pain Location  Shoulder    Pain Orientation  Right    Pain Descriptors / Indicators  Aching;Sharp;Shooting;Throbbing    Pain Type  Chronic pain    Pain Onset  More than a month ago    Pain Frequency  Constant    Aggravating Factors   See above.    Pain Relieving Factors  See above.         [facility] PT Assessment - 07/26/19 0001      Assessment   Medical  Diagnosis  Right shoulder tendonits.    Referring Provider (PT)  Hazeline Junker MD    Onset Date/Surgical Date  --   About a year.     Precautions   Precautions  --   Pacemaker.     Restrictions   Weight Bearing Restrictions  No      Balance Screen   Has the patient fallen in the past 6 months  Yes    Has the patient had a decrease in activity level because of a fear of falling?   Yes    Is the patient reluctant to leave their home because of a fear of falling?   No      Home Environment   Living Environment  Private residence      Prior Function   Level of Independence  Independent      Posture/Postural Control   Posture/Postural Control  Postural limitations    Postural Limitations  Rounded Shoulders;Forward head      Deep Tendon Reflexes   DTR Assessment Site  Biceps;Brachioradialis;Triceps    Biceps DTR  2+    Brachioradialis DTR  2+    Triceps DTR  2+      ROM / Strength   AROM / PROM / Strength  AROM;Strength      AROM   Overall AROM Comments  Right shoulder active flexion limited to 95 degrees in the antigravity plane but is nearly full passively.  Active-assitive right shoulder ER= 90 degrees.      Strength   Overall Strength Comments  Right deltoid strength= 3-/5, IR= 4/5 and ER= 4-/5.      Palpation   Palpation comment  Tender to palpation over right bicipital groove amd medial deltoid.  She also c/o pain over her proximal Brachioradialis.      Special Tests   Other special tests  (+) right Speed's test.  Mildly positive Drop Arm test.                Objective measurements completed on examination: See above findings.      [facility] Adult PT Treatment/Exercise - 07/26/19 0001      Modalities   Modalities  Vasopneumatic      Vasopneumatic   Number Minutes Vasopneumatic   10 minutes    Vasopnuematic Location   --   Right shoulder with pillow btw thorax and elbow.  PT Short Term Goals - 07/26/19 1252      PT SHORT TERM  GOAL #1   Title  STG's=LTG's.        PT Long Term Goals - 07/26/19 1252      PT LONG TERM GOAL #1   Title  Independent with an HEP.    Time  6    Period  Weeks    Status  New      PT LONG TERM GOAL #2   Title  Active shoulder flexion to 145 degrees so the patient can easily reach overhead.    Time  6    Period  Weeks    Status  New      PT LONG TERM GOAL #3   Title  Increase shoulder strength to a solid 4+/5 to increase stability for performance of functional activities.    Time  6    Period  Weeks    Status  New      PT LONG TERM GOAL #4   Title  Perform ADL's with right shoulder pain not > 3/10.    Time  6    Period  Weeks    Status  New             Plan - 07/26/19 1246    Clinical Impression Statement  The patient presents to OPPT with c/o right shoulder pain that has been ongoing for about a year.  She is very tender over her Bicipital groove and medial deltoid.  She limited active right shoulder range of motion though passively her right shoulder in essentially normal.  She shoulder is weak and she demonstrates a positive right Speed's test.  Patient will benefit from skilled physical therapy intervention to address deficits and pain.    Personal Factors and Comorbidities  Comorbidity 1;Comorbidity 2;Comorbidity 3+    Comorbidities  OP, cervical and lumbar surgery; Pacemaker, fibromyalgia, neck surgery, MI.    Examination-Activity Limitations  Reach Overhead;Other    Examination-Participation Restrictions  Other    Stability/Clinical Decision Making  Evolving/Moderate complexity    Clinical Decision Making  Low    Rehab Potential  Good    PT Frequency  2x / week    PT Duration  6 weeks    PT Treatment/Interventions  ADLs/Self Care Home Management;Cryotherapy;Therapeutic activities;Therapeutic exercise;Patient/family education;Manual techniques;Iontophoresis 4mg /ml Dexamethasone;Passive range of motion;Dry needling;Vasopneumatic Device    PT Next Visit Plan   STW/M to patient's affected shoulder.  AAROM, isometrics and progress to PRE's.  Vasopneumatic and iontophoresis.    Consulted and Agree with Plan of Care  Patient       Patient will benefit from skilled therapeutic intervention in order to improve the following deficits and impairments:  Pain, Decreased activity tolerance, Decreased strength, Decreased range of motion  Visit Diagnosis: Chronic right shoulder pain - Plan: PT plan of care cert/re-cert  Muscle weakness (generalized) - Plan: PT plan of care cert/re-cert     Problem List Patient Active Problem List   Diagnosis Date Noted  . Overweight (BMI 25.0-29.9) 04/15/2019  . Sigmoid diverticulitis 04/14/2019  . Chronic diastolic CHF (congestive heart failure) (HCC) 04/14/2019  . Chest pain 01/17/2019  . Unstable angina (HCC) 10/20/2017  . Chronic pain 09/23/2016  . Pre-diabetes 09/02/2016  . Recurrent major depressive disorder, in partial remission (HCC) 07/14/2016  . Seizures (HCC) 07/14/2016  . GAD (generalized anxiety disorder) 07/14/2016  . Insomnia 09/05/2014  . Allergic rhinitis 09/05/2014  . Cervical spondylosis without myelopathy 12/19/2013  Class: Chronic  . Neural foraminal stenosis of cervical spine 12/19/2013  . Cervical radiculitis 09/19/2013  . Lumbosacral spondylosis without myelopathy 11/16/2012  . Postlaminectomy syndrome, lumbar region 11/16/2012  . Herpes simplex virus (HSV) infection 10/31/2008  . Hyperlipidemia with target LDL less than 100 10/31/2008  . Essential hypertension 10/31/2008  . GERD 10/31/2008  . SPINAL STENOSIS OF LUMBAR REGION 10/31/2008  . Myalgia 10/31/2008  . Osteoporosis 10/31/2008  . SPONDYLOLISTHESIS 10/31/2008  . SYNCOPE 10/31/2008  . Sleep apnea 10/31/2008    Tearra Ouk, [place] MPT 07/26/2019, 12:56 PM  [facility] [address] [place], [place], [address] Phone: [phone]   Fax:  [phone]  Name: FARRAH SKODA MRN:  [id number] Date of Birth: 30-Jul-1955

## 2019-07-28 ENCOUNTER — Other Ambulatory Visit: Payer: Self-pay

## 2019-07-28 ENCOUNTER — Ambulatory Visit: Payer: 59 | Admitting: Physical Therapy

## 2019-07-28 ENCOUNTER — Encounter: Payer: Self-pay | Admitting: Physical Therapy

## 2019-07-28 DIAGNOSIS — R293 Abnormal posture: Secondary | ICD-10-CM

## 2019-07-28 DIAGNOSIS — G8929 Other chronic pain: Secondary | ICD-10-CM

## 2019-07-28 DIAGNOSIS — M25511 Pain in right shoulder: Secondary | ICD-10-CM | POA: Diagnosis not present

## 2019-07-28 DIAGNOSIS — M6281 Muscle weakness (generalized): Secondary | ICD-10-CM

## 2019-07-28 NOTE — Therapy (Signed)
[facility] [address] [place], [place], [address] Phone: [phone]   Fax:  [phone]  Physical Therapy Treatment  Patient Details  Name: [name] MRN: [id number] Date of Birth: 1955/09/19 Referring Provider (PT): Hazeline Junker MD   Encounter Date: 07/28/2019  PT End of Session - 07/28/19 1153    Visit Number  2    Number of Visits  12    Date for PT Re-Evaluation  09/19/19    PT Start Time  1115    PT Stop Time  1158    PT Time Calculation (min)  43 min    Activity Tolerance  Patient tolerated treatment well    Behavior During Therapy  [facility] for tasks assessed/performed       Past Medical History:  Diagnosis Date  . Anginal pain (HCC)    last time   . Anxiety   . Arthritis    RHEUMATOID  . Asthma   . Bipolar 1 disorder (HCC)   . Cataracts, bilateral 07/2017  . COPD (chronic obstructive pulmonary disease) (HCC)   . Coronary artery disease    reported hx of "MI";  Echo 2009 with normal LVF;  Myoview 05/2011: no ischemia  . Depression   . Dyslipidemia   . Dysrhythmia    SVT  . Esophageal stricture   . Fibromyalgia   . GERD (gastroesophageal reflux disease)   . H/O hiatal hernia   . Head injury, unspecified   . Herpes simplex infection   . History of kidney stones   . Hyperlipidemia   . Hypertension   . Insomnia   . Myocardial infarction [name])    age 64  . Osteoporosis   . Pneumonia    hx  . Seizures (HCC)    last 3 weeks ago- prescribed keppra -never took didnt like side effects read about online  . Shortness of breath   . Sleep apnea    ? neg  . Spinal stenosis of lumbar region   . Spondylolisthesis   . Status post placement of implantable loop recorder   . Supraventricular tachycardia (HCC)   . Syncope and collapse    s/p ILR; no arhythmogenic cause identified  . UTI (lower urinary tract infection)     Past Surgical History:  Procedure Laterality Date  . BACK SURGERY    . BREAST SURGERY      lumpectomy  . CATARACT EXTRACTION W/PHACO Right 07/31/2017   Procedure: CATARACT EXTRACTION PHACO AND INTRAOCULAR LENS PLACEMENT (IOC);  Surgeon: Fabio Pierce, MD;  Location: [facility];  Service: Ophthalmology;  Laterality: Right;  CDE: 2.33  . CATARACT EXTRACTION W/PHACO Left 08/14/2017   Procedure: CATARACT EXTRACTION PHACO AND INTRAOCULAR LENS PLACEMENT (IOC);  Surgeon: Fabio Pierce, MD;  Location: [facility];  Service: Ophthalmology;  Laterality: Left;  CDE: 2.74  . CHOLECYSTECTOMY    . CYSTOSCOPY     stone  . DOPPLER ECHOCARDIOGRAPHY  2009  . head up tilt table testing  06/15/2007   [name]  . HEMORRHOID SURGERY    . insertion of implatable loop recorder  08/11/2007   [name]  . POSTERIOR CERVICAL FUSION/FORAMINOTOMY N/A 12/19/2013   Procedure: RIGHT C3-4.C4-5 AND C5-6 FORAMINOTOMIES;  Surgeon: Kerrin Champagne, MD;  Location: [facility];  Service: Orthopedics;  Laterality: N/A;  . TUBAL LIGATION      There were no vitals filed for this visit.  Subjective Assessment - 07/28/19 1125    Subjective  COVID-19 screen performed prior to patient entering  clinic.  Patient arrived with ongoing pain, ice helped it last treatment    Pertinent History  OP, cervical and lumbar surgery; Pacemaker, fibromyalgia, neck surgery, MI.    Limitations  Sitting    How long can you sit comfortably?  10-15 minutes.    How long can you walk comfortably?  Short distances.    Patient Stated Goals  Use right UE without much pain.    Currently in Pain?  Yes    Pain Score  9     Pain Location  Shoulder    Pain Orientation  Right    Pain Descriptors / Indicators  Discomfort;Sore    Pain Type  Chronic pain    Pain Onset  More than a month ago    Pain Frequency  Constant    Aggravating Factors   movement    Pain Relieving Factors  rest                       [facility] Adult PT Treatment/Exercise - 07/28/19 0001      Exercises   Exercises  Shoulder      Shoulder Exercises: Pulleys   Flexion  5  minutes    Other Pulley Exercises  seated UE ranger for flexion and circles x69min      Vasopneumatic   Number Minutes Vasopneumatic   15 minutes    Vasopnuematic Location   Shoulder   right   Vasopneumatic Pressure  Low    Vasopneumatic Temperature   34 for edema      Manual Therapy   Manual Therapy  Soft tissue mobilization    Manual therapy comments  manual STW to right shoulder to post cuff, middle deltoid, bicipital groove area and around forearm to relieve pain and tone               PT Short Term Goals - 07/26/19 1252      PT SHORT TERM GOAL #1   Title  STG's=LTG's.        PT Long Term Goals - 07/28/19 1154      PT LONG TERM GOAL #1   Title  Independent with an HEP.    Time  6    Period  Weeks    Status  On-going      PT LONG TERM GOAL #2   Title  Active shoulder flexion to 145 degrees so the patient can easily reach overhead.    Time  6    Period  Weeks    Status  On-going      PT LONG TERM GOAL #3   Title  Increase shoulder strength to a solid 4+/5 to increase stability for performance of functional activities.    Time  6    Period  Weeks    Status  On-going      PT LONG TERM GOAL #4   Title  Perform ADL's with right shoulder pain not > 3/10.    Time  6    Period  Weeks    Status  On-going            Plan - 07/28/19 1155    Clinical Impression Statement  Patient tolerated treatment fair today due to ongoing pain in shoulder. Patient was only able to perform gentle AAROM activities due to pain. Today focused on manual STW to decrease pain and tone. Patient is limited with movements and ADL's esp with dressing. Patient goals ongoing at this time.  Personal Factors and Comorbidities  Comorbidity 1;Comorbidity 2;Comorbidity 3+    Comorbidities  OP, cervical and lumbar surgery; Pacemaker, fibromyalgia, neck surgery, MI.    Examination-Activity Limitations  Reach Overhead;Other    Examination-Participation Restrictions  Other     Stability/Clinical Decision Making  Evolving/Moderate complexity    Rehab Potential  Good    Clinical Impairments Affecting Rehab Potential  Chronicity.    PT Frequency  2x / week    PT Duration  6 weeks    PT Treatment/Interventions  ADLs/Self Care Home Management;Cryotherapy;Therapeutic activities;Therapeutic exercise;Patient/family education;Manual techniques;Iontophoresis 4mg /ml Dexamethasone;Passive range of motion;Dry needling;Vasopneumatic Device    PT Next Visit Plan  STW/M to patient's affected shoulder.  AAROM, isometrics and progress to PRE's.  Vasopneumatic and iontophoresis.    Consulted and Agree with Plan of Care  Patient       Patient will benefit from skilled therapeutic intervention in order to improve the following deficits and impairments:  Pain, Decreased activity tolerance, Decreased strength, Decreased range of motion  Visit Diagnosis: Chronic right shoulder pain  Muscle weakness (generalized)  Abnormal posture     Problem List Patient Active Problem List   Diagnosis Date Noted  . Overweight (BMI 25.0-29.9) 04/15/2019  . Sigmoid diverticulitis 04/14/2019  . Chronic diastolic CHF (congestive heart failure) (HCC) 04/14/2019  . Chest pain 01/17/2019  . Unstable angina (HCC) 10/20/2017  . Chronic pain 09/23/2016  . Pre-diabetes 09/02/2016  . Recurrent major depressive disorder, in partial remission (HCC) 07/14/2016  . Seizures (HCC) 07/14/2016  . GAD (generalized anxiety disorder) 07/14/2016  . Insomnia 09/05/2014  . Allergic rhinitis 09/05/2014  . Cervical spondylosis without myelopathy 12/19/2013    Class: Chronic  . Neural foraminal stenosis of cervical spine 12/19/2013  . Cervical radiculitis 09/19/2013  . Lumbosacral spondylosis without myelopathy 11/16/2012  . Postlaminectomy syndrome, lumbar region 11/16/2012  . Herpes simplex virus (HSV) infection 10/31/2008  . Hyperlipidemia with target LDL less than 100 10/31/2008  . Essential hypertension  10/31/2008  . GERD 10/31/2008  . SPINAL STENOSIS OF LUMBAR REGION 10/31/2008  . Myalgia 10/31/2008  . Osteoporosis 10/31/2008  . SPONDYLOLISTHESIS 10/31/2008  . SYNCOPE 10/31/2008  . Sleep apnea 10/31/2008    12/31/2008, PTA 07/28/2019, 12:01 PM  [facility] [address] [place], [place], [place] Phone: [phone]   Fax:  [phone]  Name: HETVI SHAWHAN MRN: [name] Date of Birth: 1955/04/28

## 2019-07-30 ENCOUNTER — Other Ambulatory Visit: Payer: Self-pay | Admitting: Specialist

## 2019-07-30 ENCOUNTER — Other Ambulatory Visit: Payer: Self-pay | Admitting: Nurse Practitioner

## 2019-07-30 DIAGNOSIS — I10 Essential (primary) hypertension: Secondary | ICD-10-CM

## 2019-08-01 ENCOUNTER — Other Ambulatory Visit: Payer: Self-pay | Admitting: Cardiology

## 2019-08-01 ENCOUNTER — Other Ambulatory Visit: Payer: Self-pay | Admitting: Nurse Practitioner

## 2019-08-01 ENCOUNTER — Ambulatory Visit: Payer: 59

## 2019-08-01 ENCOUNTER — Ambulatory Visit (INDEPENDENT_AMBULATORY_CARE_PROVIDER_SITE_OTHER): Payer: 59

## 2019-08-01 DIAGNOSIS — Z0000 Encounter for general adult medical examination without abnormal findings: Secondary | ICD-10-CM | POA: Diagnosis not present

## 2019-08-01 NOTE — Progress Notes (Signed)
MEDICARE ANNUAL WELLNESS VISIT  08/01/2019  Telephone Visit Disclaimer This Medicare AWV was conducted by telephone due to national recommendations for restrictions regarding the COVID-19 Pandemic (e.g. social distancing).  I verified, using two identifiers, that I am speaking with [name] or their authorized healthcare agent. I discussed the limitations, risks, security, and privacy concerns of performing an evaluation and management service by telephone and the potential availability of an in-person appointment in the future. The patient expressed understanding and agreed to proceed.   Subjective:  [name] is a 64 y.o. female patient of Erica Pierini, FNP who had a Medicare Annual Wellness Visit today via telephone. [name] is Disabled and lives alone. She has fivechildren. She reports that she is socially active and does interact with friends/family regularly. She is minimally physically active and enjoys playing the guitar when she can and spending time with family.  Patient Care Team: Erica Pierini, FNP as PCP - General (Nurse Practitioner) Antoine Poche, MD as PCP - Cardiology (Cardiology) Kerrin Champagne, MD as Consulting Physician (Orthopedic Surgery) Marinus Maw, MD as Consulting Physician (Cardiology) Branch, Dorothe Pea, MD as Consulting Physician (Cardiology) Kathryne Hitch, MD as Consulting Physician (Orthopedic Surgery) Kerrin Champagne, MD as Consulting Physician (Orthopedic Surgery)  Advanced Directives 08/01/2019 04/15/2019 01/18/2019 01/17/2019 07/29/2018 12/22/2017 08/14/2017  Does Patient Have a Medical Advance Directive? No No No No No No No  Would patient like information on creating a medical advance directive? No - Patient declined No - Patient declined - No - Patient declined No - Patient declined - No - Patient declined  Pre-existing out of facility DNR order (yellow form or pink MOST form) - - - - - - -    Hospital  Utilization Over the Past 12 Months: # of hospitalizations or ER visits: 3 # of surgeries: 0  Review of Systems    Patient reports that her overall health is unchanged compared to last year.  History obtained from chart review and the patient  Patient Reported Readings (BP, Pulse, CBG, Weight, etc) none  Pain Assessment Pain : 0-10 Pain Score: 8  Pain Type: Acute pain Pain Location: Shoulder Pain Orientation: Right Pain Descriptors / Indicators: Aching, Stabbing Pain Onset: 1 to 4 weeks ago Pain Frequency: Constant Pain Relieving Factors: rest  Pain Relieving Factors: rest  Current Medications & Allergies (verified) Allergies as of 08/01/2019      Reactions   Codeine Other (See Comments)   "I will have a heart attack."   Morphine And Related Other (See Comments)   "It will cause me to have a heart attack."   Ambien [zolpidem Tartrate] Nausea And Vomiting   Lyrica [pregabalin] Swelling, Other (See Comments)   Weight gain   Neurontin [gabapentin] Other (See Comments)   Causes elevated LFTs      Medication List       Accurate as of Aug 01, 2019  9:26 AM. If you have any questions, ask your nurse or doctor.        acyclovir ointment 5 % Commonly known as: ZOVIRAX APPLY OINTMENT TO AFFECTED AREA EVERY 3 HOURS. What changed: See the new instructions.   albuterol (2.5 MG/3ML) 0.083% nebulizer solution Commonly known as: PROVENTIL USE 1 VIAL IN NEBULIZER EVERY 4 HOURS AS NEEDED FOR WHEEZING. What changed: See the new instructions.   albuterol 108 (90 Base) MCG/ACT inhaler Commonly known as: VENTOLIN HFA INHALE 2 PUFFS EVERY 6 HOURS AS NEEDED FOR SHORTNESS OF  BREATH AND WHEEZING. What changed: See the new instructions.   Breo Ellipta 200-25 MCG/INH Aepb Generic drug: fluticasone furoate-vilanterol Inhale 1 puff into the lungs daily.   busPIRone 10 MG tablet Commonly known as: BUSPAR TAKE 1 TABLET BY MOUTH TWICE DAILY AS NEEDED.     butalbital-acetaminophen-caffeine 50-325-40 MG tablet Commonly known as: FIORICET Take 1 tablet by mouth every 6 (six) hours as needed for headache.   carvedilol 6.25 MG tablet Commonly known as: COREG Take 1 tablet (6.25 mg total) by mouth 2 (two) times daily with a meal.   celecoxib 200 MG capsule Commonly known as: CELEBREX TAKE 1 CAPSULE DAILY FOR 7 DAYS, THEN TAKE 1 CAPSULE 2 TIMES A DAY FOR 21 DAYS.   chlorzoxazone 500 MG tablet Commonly known as: Parafon Forte DSC Take 1 tablet (500 mg total) by mouth every 6 (six) hours as needed for muscle spasms.   Dexilant 60 MG capsule Generic drug: dexlansoprazole Take 1 capsule (60 mg total) by mouth daily.   diclofenac Sodium 1 % Gel Commonly known as: Voltaren Apply 4 g topically 4 (four) times daily.   DULoxetine 60 MG capsule Commonly known as: CYMBALTA Take 1 capsule (60 mg total) by mouth at bedtime.   escitalopram 20 MG tablet Commonly known as: LEXAPRO TAKE 1 TABLET ONCE DAILY.   estradiol 0.1 MG/GM vaginal cream Commonly known as: ESTRACE PLACE 1 APPLICATORFUL VAGINALLY AT BEDTIME.   fludrocortisone 0.1 MG tablet Commonly known as: FLORINEF Take 1 tablet (0.1 mg total) by mouth every other day.   fluticasone 50 MCG/ACT nasal spray Commonly known as: FLONASE SPRAY 2 SPRAYS IN EACH NOSTRIL ONCE DAILY. What changed: See the new instructions.   furosemide 40 MG tablet Commonly known as: LASIX TAKE 1 TABLET ONCE DAILY AS NEEDED FOR FLUID.   Global Alcohol Prep Ease 70 % Pads USE 1 PAD DAILY WHEN CHECKING SUGAR.   HYDROcodone-acetaminophen 7.5-325 MG tablet Commonly known as: Norco Take 1 tablet by mouth in the morning and at bedtime.   HYDROcodone-acetaminophen 7.5-325 MG tablet Commonly known as: Norco Take 1 tablet by mouth in the morning and at bedtime. Start taking on: Aug 08, 2019   ipratropium 0.02 % nebulizer solution Commonly known as: ATROVENT Take 2.5 mLs (0.5 mg total) by nebulization every  4 (four) hours as needed for wheezing.   isosorbide mononitrate 30 MG 24 hr tablet Commonly known as: IMDUR Take 1 tablet (30 mg total) by mouth daily.   lamoTRIgine 150 MG tablet Commonly known as: LAMICTAL Take 1 tablet (150 mg total) by mouth daily.   levETIRAcetam 500 MG tablet Commonly known as: KEPPRA TAKE (1) TABLET TWICE DAILY.   levocetirizine 5 MG tablet Commonly known as: XYZAL TAKE 1 TABLET BY MOUTH EVERY MORNING. What changed:   when to take this  reasons to take this   lidocaine 5 % ointment Commonly known as: XYLOCAINE APPLY AFFECTED AREA THREE TIMES DAILY AS NEEDED FOR MILD OR MODERATE PAIN. What changed: See the new instructions.   nitroGLYCERIN 0.4 MG SL tablet Commonly known as: NITROSTAT PLACE ONE (1) TABLET UNDER TONGUE EVERY 5 MINUTES UP TO (3) DOSES AS NEEDED FOR CHEST PAIN.   ondansetron 4 MG tablet Commonly known as: ZOFRAN TAKE 1 TABLET BY MOUTH EVERY 8 HOURS AS NEEDED FOR NAUSEA AND VOMITING. What changed:   how much to take  how to take this  when to take this  reasons to take this  additional instructions   OneTouch Delica Plus Lancet33G Misc  USE TO CHECK SUGAR DAILY.   oxybutynin 5 MG tablet Commonly known as: DITROPAN Take 1 tablet (5 mg total) by mouth at bedtime.   rosuvastatin 10 MG tablet Commonly known as: CRESTOR Take 1 tablet (10 mg total) by mouth daily.   sucralfate 1 g tablet Commonly known as: CARAFATE Take 1 tablet (1 g total) by mouth 4 (four) times daily -  with meals and at bedtime.   tiZANidine 4 MG tablet Commonly known as: ZANAFLEX TAKE (1) TABLET EVERY SIX HOURS AS NEEDED FOR MUSCLE SPASMS.   traZODone 150 MG tablet Commonly known as: DESYREL Take 1 tablet (150 mg total) by mouth at bedtime.   valACYclovir 500 MG tablet Commonly known as: VALTREX Take 1 tablet (500 mg total) by mouth 2 (two) times daily.       History (reviewed): Past Medical History:  Diagnosis Date  . Anginal pain (HCC)     last time   . Anxiety   . Arthritis    RHEUMATOID  . Asthma   . Bipolar 1 disorder (HCC)   . Cataracts, bilateral 07/2017  . COPD (chronic obstructive pulmonary disease) (HCC)   . Coronary artery disease    reported hx of "MI";  Echo 2009 with normal LVF;  Myoview 05/2011: no ischemia  . Depression   . Dyslipidemia   . Dysrhythmia    SVT  . Esophageal stricture   . Fibromyalgia   . GERD (gastroesophageal reflux disease)   . H/O hiatal hernia   . Head injury, unspecified   . Herpes simplex infection   . History of kidney stones   . Hyperlipidemia   . Hypertension   . Insomnia   . Myocardial infarction [facility])    age 54  . Osteoporosis   . Pneumonia    hx  . Seizures (HCC)    last 3 weeks ago- prescribed keppra -never took didnt like side effects read about online  . Shortness of breath   . Sleep apnea    ? neg  . Spinal stenosis of lumbar region   . Spondylolisthesis   . Status post placement of implantable loop recorder   . Supraventricular tachycardia (HCC)   . Syncope and collapse    s/p ILR; no arhythmogenic cause identified  . UTI (lower urinary tract infection)    Past Surgical History:  Procedure Laterality Date  . BACK SURGERY    . BREAST SURGERY     lumpectomy  . CATARACT EXTRACTION W/PHACO Right 07/31/2017   Procedure: CATARACT EXTRACTION PHACO AND INTRAOCULAR LENS PLACEMENT (IOC);  Surgeon: Fabio Pierce, MD;  Location: [facility];  Service: Ophthalmology;  Laterality: Right;  CDE: 2.33  . CATARACT EXTRACTION W/PHACO Left 08/14/2017   Procedure: CATARACT EXTRACTION PHACO AND INTRAOCULAR LENS PLACEMENT (IOC);  Surgeon: Fabio Pierce, MD;  Location: [facility];  Service: Ophthalmology;  Laterality: Left;  CDE: 2.74  . CHOLECYSTECTOMY    . CYSTOSCOPY     stone  . DOPPLER ECHOCARDIOGRAPHY  2009  . head up tilt table testing  06/15/2007   [name]  . HEMORRHOID SURGERY    . insertion of implatable loop recorder  08/11/2007   [name]  . POSTERIOR  CERVICAL FUSION/FORAMINOTOMY N/A 12/19/2013   Procedure: RIGHT C3-4.C4-5 AND C5-6 FORAMINOTOMIES;  Surgeon: Kerrin Champagne, MD;  Location: [facility] OR;  Service: Orthopedics;  Laterality: N/A;  . TUBAL LIGATION     Family History  Problem Relation Age of Onset  . Heart attack Father   .  Cancer Father   . Mental illness Father   . Cancer Mother   . Mental illness Mother   . Heart attack Brother        stents  . Alcohol abuse Brother   . Heart disease Brother   . Drug abuse Brother   . Diabetes Brother   . Colon cancer Maternal Aunt   . Cirrhosis Brother   . Stomach cancer Neg Hx    Social History   Socioeconomic History  . Marital status: Single    Spouse name: Not on file  . Number of children: 5  . Years of education: Not on file  . Highest education level: 5th grade  Occupational History  . Occupation: Disability    Comment: 15 years  Tobacco Use  . Smoking status: Never Smoker  . Smokeless tobacco: Never Used  Substance and Sexual Activity  . Alcohol use: No    Alcohol/week: 0.0 standard drinks  . Drug use: No  . Sexual activity: Not Currently  Other Topics Concern  . Not on file  Social History Narrative   Patient lives with Daughter   Patient is on disability.    Patient has 5th grade education.    Patient has 5 children, 24 grand-chilren, and 5 great-grand children.    Right handed    Social Determinants of Health   Financial Resource Strain:   . Difficulty of Paying Living Expenses:   Food Insecurity:   . Worried About Programme researcher, broadcasting/film/video in the Last Year:   . Barista in the Last Year:   Transportation Needs:   . Freight forwarder (Medical):   [name] Lack of Transportation (Non-Medical):   Physical Activity:   . Days of Exercise per Week:   . Minutes of Exercise per Session:   Stress:   . Feeling of Stress :   Social Connections:   . Frequency of Communication with Friends and Family:   . Frequency of Social Gatherings with Friends and Family:    . Attends Religious Services:   . Active Member of Clubs or Organizations:   . Attends Banker Meetings:   [name] Marital Status:     Activities of Daily Living In your present state of health, do you have any difficulty performing the following activities: 08/01/2019 04/15/2019  Hearing? N N  Vision? Y N  Comment needs new glasses, has appointment 09/01/19 -  Difficulty concentrating or making decisions? N N  Walking or climbing stairs? Y N  Comment back pain -  Dressing or bathing? N N  Doing errands, shopping? N N  Preparing Food and eating ? N -  Using the Toilet? N -  In the past six months, have you accidently leaked urine? N -  Do you have problems with loss of bowel control? N -  Managing your Medications? N -  Managing your Finances? N -  Housekeeping or managing your Housekeeping? N -  Some recent data might be hidden    Patient Education/ Literacy How often do you need to have someone help you when you read instructions, pamphlets, or other written materials from your doctor or pharmacy?: 1 - Never What is the last grade level you completed in school?: 5th  Exercise Current Exercise Habits: The patient does not participate in regular exercise at present, Exercise limited by: orthopedic condition(s)  Diet Patient reports consuming 1 meals a day and 0 snack(s) a day Patient reports that her primary diet is: Regular  Patient reports that she does have regular access to food.   Depression Screen PHQ 2/9 Scores 08/01/2019 06/09/2019 05/02/2019 03/08/2019 12/16/2018 12/14/2018 10/26/2018  PHQ - 2 Score 0 0 0 0 0 0 2  PHQ- 9 Score - - - - - - 5     Fall Risk Fall Risk  08/01/2019 06/09/2019 05/02/2019 03/08/2019 12/16/2018  Falls in the past year? 1 1 0 0 0  Number falls in past yr: 1 0 - - -  Comment - - - - -  Injury with Fall? 1 0 - - -  Comment right shoulder pain - - - -  Risk Factor Category  - - - - -  Risk for fall due to : History of fall(s);Impaired  mobility;Orthopedic patient - - - -  Risk for fall due to: Comment - - - - -  Follow up Falls evaluation completed - - - -     Objective:  [name] seemed alert and oriented and she participated appropriately during our telephone visit.  Blood Pressure Weight BMI  BP Readings from Last 3 Encounters:  06/13/19 125/84  06/09/19 119/74  05/02/19 (!) 138/97   Wt Readings from Last 3 Encounters:  06/13/19 146 lb (66.2 kg)  06/09/19 146 lb (66.2 kg)  05/02/19 146 lb (66.2 kg)   BMI Readings from Last 1 Encounters:  06/13/19 26.70 kg/m    *Unable to obtain current vital signs, weight, and BMI due to telephone visit type  Hearing/Vision  . Dayna did not seem to have difficulty with hearing/understanding during the telephone conversation . Reports that she has had a formal eye exam by an eye care professional within the past year . Reports that she has not had a formal hearing evaluation within the past year *Unable to fully assess hearing and vision during telephone visit type  Cognitive Function: 6CIT Screen 08/01/2019 07/29/2018  What Year? 0 points 0 points  What month? 0 points 0 points  What time? 0 points 0 points  Count back from 20 0 points 0 points  Months in reverse - 4 points  Repeat phrase 4 points 2 points  Total Score - 6   (Normal:0-7, Significant for Dysfunction: >8)  Normal Cognitive Function Screening: Yes   Immunization & Health Maintenance Record Immunization History  Administered Date(s) Administered  . Influenza Split 06/11/2013  . Influenza Whole 12/19/2011  . Influenza,inj,Quad PF,6+ Mos 01/25/2013, 01/05/2014, 02/12/2016, 05/07/2017, 03/16/2018, 01/19/2019  . Pneumococcal Conjugate-13 01/05/2014  . Pneumococcal Polysaccharide-23 11/30/2011, 01/31/2012  . Tdap 08/05/2011  . Zoster 01/02/2012    Health Maintenance  Topic Date Due  . COVID-19 Vaccine (1) Never done  . PAP SMEAR-Modifier  10/15/2019  . INFLUENZA VACCINE  10/30/2019  .  TETANUS/TDAP  08/04/2021  . COLONOSCOPY  04/26/2022  . Hepatitis C Screening  Completed  . HIV Screening  Completed  . MAMMOGRAM  Discontinued       Assessment  This is a routine wellness examination for HELENE BERNSTEIN.  Health Maintenance: Due or Overdue Health Maintenance Due  Topic Date Due  . COVID-19 Vaccine (1) Never done    [name] does not need a referral for Community Assistance: Care Management:   no Social Work:    no Prescription Assistance:  no Nutrition/Diabetes Education:  no   Plan:  Personalized Goals Goals Addressed            This Visit's Progress   . Patient Stated  08/01/2019 AWV Goal: Exercise for General Health   Patient will verbalize understanding of the benefits of increased physical activity:  Exercising regularly is important. It will improve your overall fitness, flexibility, and endurance.  Regular exercise also will improve your overall health. It can help you control your weight, reduce stress, and improve your bone density.  Over the next year, patient will increase physical activity as tolerated with a goal of at least 150 minutes of moderate physical activity per week.   You can tell that you are exercising at a moderate intensity if your heart starts beating faster and you start breathing faster but can still hold a conversation.  Moderate-intensity exercise ideas include:  Walking 1 mile (1.6 km) in about 15 minutes  Biking  Hiking  Golfing  Dancing  Water aerobics  Patient will verbalize understanding of everyday activities that increase physical activity by providing examples like the following: ? Yard work, such as: ? Pushing a Conservation officer, nature ? Raking and bagging leaves ? Washing your car ? Pushing a stroller ? Shoveling snow ? Gardening ? Washing windows or floors  Patient will be able to explain general safety guidelines for exercising:   Before you start a new exercise program, talk with  your health care provider.  Do not exercise so much that you hurt yourself, feel dizzy, or get very short of breath.  Wear comfortable clothes and wear shoes with good support.  Drink plenty of water while you exercise to prevent dehydration or heat stroke.  Work out until your breathing and your heartbeat get faster.       Personalized Health Maintenance & Screening Recommendations  Screening mammography  Lung Cancer Screening Recommended: no (Low Dose CT Chest recommended if Age 61-80 years, 30 pack-year currently smoking OR have quit w/in past 15 years) Hepatitis C Screening recommended: no HIV Screening recommended: no  Advanced Directives: Written information was not prepared per patient's request.  Referrals & Orders No orders of the defined types were placed in this encounter.   Follow-up Plan . Follow-up with Chevis Pretty, FNP as planned . Schedule mammogram   After visit summary was declined by patient.   I have personally reviewed and noted the following in the patient's chart:   . Medical and social history . Use of alcohol, tobacco or illicit drugs  . Current medications and supplements . Functional ability and status . Nutritional status . Physical activity . Advanced directives . List of other physicians . Hospitalizations, surgeries, and ER visits in previous 12 months . Vitals . Screenings to include cognitive, depression, and falls . Referrals and appointments  In addition, I have reviewed and discussed with [name] certain preventive protocols, quality metrics, and best practice recommendations. A written personalized care plan for preventive services as well as general preventive health recommendations is available and can be mailed to the patient at her request.      Burnadette Pop  08/01/2019

## 2019-08-04 ENCOUNTER — Ambulatory Visit: Payer: 59 | Attending: Specialist | Admitting: *Deleted

## 2019-08-04 ENCOUNTER — Other Ambulatory Visit: Payer: Self-pay

## 2019-08-04 DIAGNOSIS — M6281 Muscle weakness (generalized): Secondary | ICD-10-CM | POA: Diagnosis not present

## 2019-08-04 DIAGNOSIS — R293 Abnormal posture: Secondary | ICD-10-CM | POA: Diagnosis present

## 2019-08-04 DIAGNOSIS — M25511 Pain in right shoulder: Secondary | ICD-10-CM | POA: Diagnosis present

## 2019-08-04 DIAGNOSIS — G8929 Other chronic pain: Secondary | ICD-10-CM | POA: Diagnosis present

## 2019-08-04 NOTE — Therapy (Signed)
[facility] [address] [place], [place], [address] Phone: [phone]   Fax:  [phone]  Physical Therapy Treatment  Patient Details  Name: [name] MRN: [id number] Date of Birth: 03/07/1956 Referring Provider (PT): Hazeline Junker MD   Encounter Date: 08/04/2019  PT End of Session - 08/04/19 1537    Visit Number  3    Number of Visits  12    Date for PT Re-Evaluation  09/19/19    PT Start Time  1354    PT Stop Time  1439    PT Time Calculation (min)  45 min       Past Medical History:  Diagnosis Date  . Anginal pain (HCC)    last time   . Anxiety   . Arthritis    RHEUMATOID  . Asthma   . Bipolar 1 disorder (HCC)   . Cataracts, bilateral 07/2017  . COPD (chronic obstructive pulmonary disease) (HCC)   . Coronary artery disease    reported hx of "MI";  Echo 2009 with normal LVF;  Myoview 05/2011: no ischemia  . Depression   . Dyslipidemia   . Dysrhythmia    SVT  . Esophageal stricture   . Fibromyalgia   . GERD (gastroesophageal reflux disease)   . H/O hiatal hernia   . Head injury, unspecified   . Herpes simplex infection   . History of kidney stones   . Hyperlipidemia   . Hypertension   . Insomnia   . Myocardial infarction [facility])    age 64  . Osteoporosis   . Pneumonia    hx  . Seizures (HCC)    last 3 weeks ago- prescribed keppra -never took didnt like side effects read about online  . Shortness of breath   . Sleep apnea    ? neg  . Spinal stenosis of lumbar region   . Spondylolisthesis   . Status post placement of implantable loop recorder   . Supraventricular tachycardia (HCC)   . Syncope and collapse    s/p ILR; no arhythmogenic cause identified  . UTI (lower urinary tract infection)     Past Surgical History:  Procedure Laterality Date  . BACK SURGERY    . BREAST SURGERY     lumpectomy  . CATARACT EXTRACTION W/PHACO Right 07/31/2017   Procedure: CATARACT EXTRACTION PHACO AND INTRAOCULAR  LENS PLACEMENT (IOC);  Surgeon: Fabio Pierce, MD;  Location: [facility];  Service: Ophthalmology;  Laterality: Right;  CDE: 2.33  . CATARACT EXTRACTION W/PHACO Left 08/14/2017   Procedure: CATARACT EXTRACTION PHACO AND INTRAOCULAR LENS PLACEMENT (IOC);  Surgeon: Fabio Pierce, MD;  Location: [facility];  Service: Ophthalmology;  Laterality: Left;  CDE: 2.74  . CHOLECYSTECTOMY    . CYSTOSCOPY     stone  . DOPPLER ECHOCARDIOGRAPHY  2009  . head up tilt table testing  06/15/2007   [name]  . HEMORRHOID SURGERY    . insertion of implatable loop recorder  08/11/2007   [name]  . POSTERIOR CERVICAL FUSION/FORAMINOTOMY N/A 12/19/2013   Procedure: RIGHT C3-4.C4-5 AND C5-6 FORAMINOTOMIES;  Surgeon: Kerrin Champagne, MD;  Location: [facility] OR;  Service: Orthopedics;  Laterality: N/A;  . TUBAL LIGATION      There were no vitals filed for this visit.  Subjective Assessment - 08/04/19 1434    Subjective  COVID-19 screen performed prior to patient entering clinic.  Patient arrived with ongoing pain, ice helped it last treatment. Very sore after last Rx, but I need it  Pertinent History  OP, cervical and lumbar surgery; Pacemaker, fibromyalgia, neck surgery, MI.    Limitations  Sitting    How long can you sit comfortably?  10-15 minutes.    Patient Stated Goals  Use right UE without much pain.    Currently in Pain?  Yes    Pain Score  7     Pain Location  Shoulder    Pain Orientation  Right    Pain Descriptors / Indicators  Shooting;Stabbing;Constant    Pain Type  Acute pain                       [facility] Adult PT Treatment/Exercise - 08/04/19 0001      Exercises   Exercises  Shoulder      Shoulder Exercises: Pulleys   Flexion  5 minutes    Other Pulley Exercises  seated UE ranger for flexion and circles x47min      Modalities   Modalities  Vasopneumatic      Vasopneumatic   Number Minutes Vasopneumatic   15 minutes    Vasopnuematic Location   Shoulder    Vasopneumatic  Pressure  Low    Vasopneumatic Temperature   34 for edema      Manual Therapy   Manual Therapy  Soft tissue mobilization    Manual therapy comments  manual STW and IASTM  TPR to right shoulder to post cuff, middle deltoid, bicipital groove area and around forearm to relieve pain and tone               PT Short Term Goals - 07/26/19 1252      PT SHORT TERM GOAL #1   Title  STG's=LTG's.        PT Long Term Goals - 07/28/19 1154      PT LONG TERM GOAL #1   Title  Independent with an HEP.    Time  6    Period  Weeks    Status  On-going      PT LONG TERM GOAL #2   Title  Active shoulder flexion to 145 degrees so the patient can easily reach overhead.    Time  6    Period  Weeks    Status  On-going      PT LONG TERM GOAL #3   Title  Increase shoulder strength to a solid 4+/5 to increase stability for performance of functional activities.    Time  6    Period  Weeks    Status  On-going      PT LONG TERM GOAL #4   Title  Perform ADL's with right shoulder pain not > 3/10.    Time  6    Period  Weeks    Status  On-going            Plan - 08/04/19 1540    Clinical Impression Statement  Pt arrived today doing about the same with RT shldr pain. She was able to perform AAROM exs , but very guarded. Notable tightness and TP's posterior cuff and only relief is with Vaso    Personal Factors and Comorbidities  Comorbidity 1;Comorbidity 2;Comorbidity 3+    Comorbidities  OP, cervical and lumbar surgery; Pacemaker, fibromyalgia, neck surgery, MI.    Examination-Activity Limitations  Reach Overhead;Other    Examination-Participation Restrictions  Other    Stability/Clinical Decision Making  Evolving/Moderate complexity    Rehab Potential  Good    Clinical Impairments Affecting Rehab Potential  Chronicity.    PT Frequency  2x / week    PT Duration  6 weeks    PT Treatment/Interventions  ADLs/Self Care Home Management;Cryotherapy;Therapeutic activities;Therapeutic  exercise;Patient/family education;Manual techniques;Iontophoresis 4mg /ml Dexamethasone;Passive range of motion;Dry needling;Vasopneumatic Device       Patient will benefit from skilled therapeutic intervention in order to improve the following deficits and impairments:  Pain, Decreased activity tolerance, Decreased strength, Decreased range of motion  Visit Diagnosis: Muscle weakness (generalized)  Chronic right shoulder pain     Problem List Patient Active Problem List   Diagnosis Date Noted  . Overweight (BMI 25.0-29.9) 04/15/2019  . Sigmoid diverticulitis 04/14/2019  . Chronic diastolic CHF (congestive heart failure) (HCC) 04/14/2019  . Chest pain 01/17/2019  . Unstable angina (HCC) 10/20/2017  . Chronic pain 09/23/2016  . Pre-diabetes 09/02/2016  . Recurrent major depressive disorder, in partial remission (HCC) 07/14/2016  . Seizures (HCC) 07/14/2016  . GAD (generalized anxiety disorder) 07/14/2016  . Insomnia 09/05/2014  . Allergic rhinitis 09/05/2014  . Cervical spondylosis without myelopathy 12/19/2013    Class: Chronic  . Neural foraminal stenosis of cervical spine 12/19/2013  . Cervical radiculitis 09/19/2013  . Lumbosacral spondylosis without myelopathy 11/16/2012  . Postlaminectomy syndrome, lumbar region 11/16/2012  . Herpes simplex virus (HSV) infection 10/31/2008  . Hyperlipidemia with target LDL less than 100 10/31/2008  . Essential hypertension 10/31/2008  . GERD 10/31/2008  . SPINAL STENOSIS OF LUMBAR REGION 10/31/2008  . Myalgia 10/31/2008  . Osteoporosis 10/31/2008  . SPONDYLOLISTHESIS 10/31/2008  . SYNCOPE 10/31/2008  . Sleep apnea 10/31/2008    Maegan Buller,CHRIS, PTA 08/04/2019, 3:52 PM  [facility] [address] [place], [place], [place] Phone: [phone]   Fax:  [phone]  Name: KLARYSSA FAUTH MRN: [name] Date of Birth: 09/16/1955

## 2019-08-08 ENCOUNTER — Ambulatory Visit (INDEPENDENT_AMBULATORY_CARE_PROVIDER_SITE_OTHER): Payer: 59 | Admitting: Nurse Practitioner

## 2019-08-08 ENCOUNTER — Encounter: Payer: Self-pay | Admitting: Nurse Practitioner

## 2019-08-08 ENCOUNTER — Other Ambulatory Visit: Payer: Self-pay

## 2019-08-08 DIAGNOSIS — M47812 Spondylosis without myelopathy or radiculopathy, cervical region: Secondary | ICD-10-CM

## 2019-08-08 MED ORDER — HYDROCODONE-ACETAMINOPHEN 7.5-325 MG PO TABS: 1.0000 | ORAL_TABLET | Freq: Four times a day (QID) | ORAL | 0 refills | Status: AC | PRN

## 2019-08-08 MED ORDER — HYDROCODONE-ACETAMINOPHEN 7.5-325 MG PO TABS: 1.0000 | ORAL_TABLET | Freq: Two times a day (BID) | ORAL | 0 refills | Status: AC

## 2019-08-08 NOTE — Progress Notes (Signed)
Subjective:    Patient ID: [name], female    DOB: 04/24/1955, 64 y.o.   MRN: [id number]   Chief Complaint: Pain   HPI Patient come in today for pain medication; she has chronic low back pain and ahs had for years. Pain assessment: Cause of pain- lumbarsacral spondylosis Pain location- he has pain in lower back, right shoulder ( rotator cuff tear) and left hip pain- is going to have a total hip replacement Pain on scale of 1-10- 10/10 Frequency- contant What increases pain-any movement of right arm and walking on left hip What makes pain Better-ret helps Effects on ADL - some day he cannot do anything Any change in general medical condition-none  Current opioids rx- norco 7.5/325 # meds rx- 60 Effectiveness of current meds-helps some Adverse reactions form pain meds-none Morphine equivalent- 15 MEDD  Pill count performed-No Last drug screen - 06/09/19 ( high risk q66m moderate risk q643mlow risk yearly ) Urine drug screen today- No Was the [place]- yes  If yes were their any concerning findings? - no   Overdose risk: 10    Pain contract signed on: 03/14/19    Review of Systems  Constitutional: Negative for diaphoresis.  Eyes: Negative for pain.  Respiratory: Negative for shortness of breath.   Cardiovascular: Negative for chest pain, palpitations and leg swelling.  Gastrointestinal: Negative for abdominal pain.  Endocrine: Negative for polydipsia.  Musculoskeletal: Positive for arthralgias (favoring right arm) and back pain.  Skin: Negative for rash.  Neurological: Negative for dizziness, weakness and headaches.  Hematological: Does not bruise/bleed easily.  All other systems reviewed and are negative.      Objective:   Physical Exam Vitals and nursing note reviewed.  Constitutional:      General: She is not in acute distress.    Appearance: Normal appearance. She is well-developed.  HENT:     Head: Normocephalic.     Nose: Nose  normal.  Eyes:     Pupils: Pupils are equal, round, and reactive to light.  Neck:     Vascular: No carotid bruit or JVD.  Cardiovascular:     Rate and Rhythm: Normal rate and regular rhythm.     Heart sounds: Normal heart sounds.  Pulmonary:     Effort: Pulmonary effort is normal. No respiratory distress.     Breath sounds: Normal breath sounds. No wheezing or rales.  Chest:     Chest wall: No tenderness.  Abdominal:     General: Bowel sounds are normal. There is no distension or abdominal bruit.     Palpations: Abdomen is soft. There is no hepatomegaly, splenomegaly, mass or pulsatile mass.     Tenderness: There is no abdominal tenderness.  Musculoskeletal:        General: Normal range of motion.     Cervical back: Normal range of motion and neck supple.     Comments: Pain with any movement of right shouder Right grip weaker then left Decrease RM of lumbar pine due to pain on flexion and extension  Lymphadenopathy:     Cervical: No cervical adenopathy.  Skin:    General: Skin is warm and dry.  Neurological:     Mental Status: She is alert and oriented to person, place, and time.     Deep Tendon Reflexes: Reflexes are normal and symmetric.  Psychiatric:        Behavior: Behavior normal.        Thought Content: Thought content normal.  Judgment: Judgment normal.      Blood pressure 129/89, pulse 97, temperature 98 F (36.7 C), temperature source Temporal, resp. rate 20, height 5' 2"  (1.575 m), weight 149 lb (67.6 kg), SpO2 96 %.       Assessment & Plan:  [name] in today with chief complaint of Pain   1. Cervical spondylosis without myelopathy Rest Let ortho know we do pain med - HYDROcodone-acetaminophen (NORCO) 7.5-325 MG tablet; Take 1 tablet by mouth in the morning and at bedtime.  Dispense: 60 tablet; Refill: 0 - HYDROcodone-acetaminophen (NORCO) 7.5-325 MG tablet; Take 1 tablet by mouth in the morning and at bedtime.  Dispense: 60 tablet;  Refill: 0 - HYDROcodone-acetaminophen (NORCO) 7.5-325 MG tablet; Take 1 tablet by mouth every 6 (six) hours as needed for moderate pain.  Dispense: 60 tablet; Refill: 0    The above assessment and management plan was discussed with the patient. The patient verbalized understanding of and has agreed to the management plan. Patient is aware to call the clinic if symptoms persist or worsen. Patient is aware when to return to the clinic for a follow-up visit. Patient educated on when it is appropriate to go to the emergency department.   [name] Hassell Done, FNP

## 2019-08-10 ENCOUNTER — Ambulatory Visit: Payer: 59 | Admitting: Physical Therapy

## 2019-08-10 ENCOUNTER — Other Ambulatory Visit: Payer: Self-pay

## 2019-08-10 ENCOUNTER — Encounter: Payer: Self-pay | Admitting: Physical Therapy

## 2019-08-10 DIAGNOSIS — R293 Abnormal posture: Secondary | ICD-10-CM

## 2019-08-10 DIAGNOSIS — M6281 Muscle weakness (generalized): Secondary | ICD-10-CM

## 2019-08-10 DIAGNOSIS — G8929 Other chronic pain: Secondary | ICD-10-CM

## 2019-08-10 NOTE — Therapy (Addendum)
[facility] [place], [place], [address] Phone: [phone]   Fax:  [phone]  Physical Therapy Treatment  Patient Details  Name: [name] MRN: [id number] Date of Birth: 1955-12-08 Referring Provider (PT): Arlyss Repress MD   Encounter Date: 08/10/2019  PT End of Session - 08/10/19 1223     Visit Number  4    Number of Visits  12    Date for PT Re-Evaluation  09/19/19    PT Start Time  1030    PT Stop Time  1051    PT Time Calculation (min)  21 min    Activity Tolerance  Patient tolerated treatment well    Behavior During Therapy  [facility] for tasks assessed/performed        Past Medical History:  Diagnosis Date   Anginal pain ([place])    last time    Anxiety    Arthritis    RHEUMATOID   Asthma    Bipolar 1 disorder ([place])    Cataracts, bilateral 07/2017   COPD (chronic obstructive pulmonary disease) ([place])    Coronary artery disease    reported hx of "MI";  Echo 2009 with normal LVF;  Myoview 05/2011: no ischemia   Depression    Dyslipidemia    Dysrhythmia    SVT   Esophageal stricture    Fibromyalgia    GERD (gastroesophageal reflux disease)    H/O hiatal hernia    Head injury, unspecified    Herpes simplex infection    History of kidney stones    Hyperlipidemia    Hypertension    Insomnia    Myocardial infarction [facility])    age 64   Osteoporosis    Pneumonia    hx   Seizures ([place])    last 3 weeks ago- prescribed keppra -never took didnt like side effects read about online   Shortness of breath    Sleep apnea    ? neg   Spinal stenosis of lumbar region    Spondylolisthesis    Status post placement of implantable loop recorder    Supraventricular tachycardia (HCC)    Syncope and collapse    s/p ILR; no arhythmogenic cause identified   UTI (lower urinary tract infection)     Past Surgical History:  Procedure Laterality Date   BACK SURGERY     BREAST SURGERY     lumpectomy   CATARACT  EXTRACTION W/PHACO Right 07/31/2017   Procedure: CATARACT EXTRACTION PHACO AND INTRAOCULAR LENS PLACEMENT ([place]);  Surgeon: [name], MD;  Location: [facility];  Service: Ophthalmology;  Laterality: Right;  CDE: 2.33   CATARACT EXTRACTION W/PHACO Left 08/14/2017   Procedure: CATARACT EXTRACTION PHACO AND INTRAOCULAR LENS PLACEMENT (IOC);  Surgeon: [name], MD;  Location: [facility];  Service: Ophthalmology;  Laterality: Left;  CDE: 2.74   CHOLECYSTECTOMY     CYSTOSCOPY     stone   DOPPLER ECHOCARDIOGRAPHY  2009   head up tilt table testing  06/15/2007   [name]   HEMORRHOID SURGERY     insertion of implatable loop recorder  08/11/2007   [name]   POSTERIOR CERVICAL FUSION/FORAMINOTOMY N/A 12/19/2013   Procedure: RIGHT C3-4.C4-5 AND C5-6 FORAMINOTOMIES;  Surgeon: [name], MD;  Location: [facility];  Service: Orthopedics;  Laterality: N/A;   TUBAL LIGATION      There were no vitals filed for this visit.  Subjective Assessment - 08/10/19 1201     Subjective  COVID-19 screen performed prior  to patient entering clinic.  I'm at a 10 today.  Only want cold treatment.    Pertinent History  OP, cervical and lumbar surgery; Pacemaker, fibromyalgia, neck surgery, MI.    Limitations  Sitting    How long can you sit comfortably?  10-15 minutes.    How long can you walk comfortably?  Short distances.    Patient Stated Goals  Use right UE without much pain.    Currently in Pain?  Yes    Pain Score  10-Worst pain ever    Pain Location  Shoulder    Pain Orientation  Right    Pain Descriptors / Indicators  Shooting;Stabbing;Constant    Pain Type  Acute pain    Pain Onset  1 to 4 weeks ago                         [facility] Adult PT Treatment/Exercise - 08/10/19 0001       Exercises   Exercises  Shoulder      Vasopneumatic   Number Minutes Vasopneumatic   15 minutes    Vasopnuematic Location   --   RT shoulder.   Vasopneumatic Pressure  Low                 PT Short Term Goals - 07/26/19 1252       PT SHORT TERM GOAL #1   Title  STG's=LTG's.         PT Long Term Goals - 07/28/19 1154       PT LONG TERM GOAL #1   Title  Independent with an HEP.    Time  6    Period  Weeks    Status  On-going      PT LONG TERM GOAL #2   Title  Active shoulder flexion to 145 degrees so the patient can easily reach overhead.    Time  6    Period  Weeks    Status  On-going      PT LONG TERM GOAL #3   Title  Increase shoulder strength to a solid 4+/5 to increase stability for performance of functional activities.    Time  6    Period  Weeks    Status  On-going      PT LONG TERM GOAL #4   Title  Perform ADL's with right shoulder pain not > 3/10.    Time  6    Period  Weeks    Status  On-going             Plan - 08/10/19 1225     Clinical Impression Statement  Patient presented to the clinic with severe pain.  She states she is trying to do her home exercises so her shoulder doesn't get stiff.    Personal Factors and Comorbidities  Comorbidity 1;Comorbidity 2;Comorbidity 3+    Comorbidities  OP, cervical and lumbar surgery; Pacemaker, fibromyalgia, neck surgery, MI.    Examination-Activity Limitations  Reach Overhead;Other    Examination-Participation Restrictions  Other    Stability/Clinical Decision Making  Evolving/Moderate complexity    Rehab Potential  Good    Clinical Impairments Affecting Rehab Potential  Chronicity.    PT Frequency  2x / week    PT Duration  6 weeks    PT Treatment/Interventions  ADLs/Self Care Home Management;Cryotherapy;Therapeutic activities;Therapeutic exercise;Patient/family education;Manual techniques;Iontophoresis 43m/ml Dexamethasone;Passive range of motion;Dry needling;Vasopneumatic Device    PT Next Visit Plan  STW/M to patient's affected  shoulder.  AAROM, isometrics and progress to PRE's.  Vasopneumatic and iontophoresis.    Consulted and Agree with Plan of Care  Patient         Patient will benefit from skilled therapeutic intervention in order to improve the following deficits and impairments:  Pain, Decreased activity tolerance, Decreased strength, Decreased range of motion  Visit Diagnosis: Muscle weakness (generalized)  Chronic right shoulder pain  Abnormal posture     Problem List Patient Active Problem List   Diagnosis Date Noted   Overweight (BMI 25.0-29.9) 04/15/2019   Sigmoid diverticulitis 04/14/2019   Chronic diastolic CHF (congestive heart failure) ([place]) 04/14/2019   Chest pain 01/17/2019   Unstable angina ([place]) 10/20/2017   Chronic pain 09/23/2016   Pre-diabetes 09/02/2016   Recurrent major depressive disorder, in partial remission ([place]) 07/14/2016   Seizures ([place]) 07/14/2016   GAD (generalized anxiety disorder) 07/14/2016   Insomnia 09/05/2014   Allergic rhinitis 09/05/2014   Cervical spondylosis without myelopathy 12/19/2013    Class: Chronic   Neural foraminal stenosis of cervical spine 12/19/2013   Cervical radiculitis 09/19/2013   Lumbosacral spondylosis without myelopathy 11/16/2012   Postlaminectomy syndrome, lumbar region 11/16/2012   Herpes simplex virus (HSV) infection 10/31/2008   Hyperlipidemia with target LDL less than 100 10/31/2008   Essential hypertension 10/31/2008   GERD 10/31/2008   SPINAL STENOSIS OF LUMBAR REGION 10/31/2008   Myalgia 10/31/2008   Osteoporosis 10/31/2008   SPONDYLOLISTHESIS 10/31/2008   SYNCOPE 10/31/2008   Sleep apnea 10/31/2008    Reeya Bound, [place] MPT 08/10/2019, 12:28 PM  [facility] [address] [place], [place], [address] Phone: [phone]   Fax:  [phone]  Name: [name] MRN: [id number] Date of Birth: 17-May-1955  PHYSICAL THERAPY DISCHARGE SUMMARY  Visits from Start of Care: 4.  Current functional level related to goals / functional outcomes: See above.   Remaining deficits: See below.   Education /  Equipment:    Patient agrees to discharge. Patient goals were not met. Patient is being discharged due to not returning since the last visit.     Mali Bartow Zylstra MPT

## 2019-08-12 ENCOUNTER — Encounter: Payer: 59 | Admitting: Physical Therapy

## 2019-08-15 ENCOUNTER — Ambulatory Visit (INDEPENDENT_AMBULATORY_CARE_PROVIDER_SITE_OTHER): Payer: 59 | Admitting: Physical Medicine and Rehabilitation

## 2019-08-15 ENCOUNTER — Other Ambulatory Visit: Payer: Self-pay

## 2019-08-15 ENCOUNTER — Encounter: Payer: Self-pay | Admitting: Physical Medicine and Rehabilitation

## 2019-08-15 ENCOUNTER — Ambulatory Visit: Payer: 59 | Admitting: Orthopaedic Surgery

## 2019-08-15 ENCOUNTER — Ambulatory Visit: Payer: Self-pay

## 2019-08-15 VITALS — Ht 61.0 in | Wt 150.0 lb

## 2019-08-15 DIAGNOSIS — G8929 Other chronic pain: Secondary | ICD-10-CM

## 2019-08-15 DIAGNOSIS — M25511 Pain in right shoulder: Secondary | ICD-10-CM | POA: Diagnosis not present

## 2019-08-15 DIAGNOSIS — M25552 Pain in left hip: Secondary | ICD-10-CM | POA: Diagnosis not present

## 2019-08-15 MED ORDER — TRIAMCINOLONE ACETONIDE 40 MG/ML IJ SUSP
60.0000 mg | INTRAMUSCULAR | Status: AC | PRN
  Administered 2019-08-15: 60 mg via INTRA_ARTICULAR

## 2019-08-15 MED ORDER — BUPIVACAINE HCL 0.5 % IJ SOLN
3.0000 mL | INTRAMUSCULAR | Status: AC | PRN
  Administered 2019-08-15: 3 mL via INTRA_ARTICULAR

## 2019-08-15 MED ORDER — BUPIVACAINE HCL 0.25 % IJ SOLN
4.0000 mL | INTRAMUSCULAR | Status: AC | PRN
  Administered 2019-08-15: 4 mL via INTRA_ARTICULAR

## 2019-08-15 NOTE — Progress Notes (Signed)
[name] - 64 y.o. female MRN [id number]  Date of birth: 1955/08/24  Office Visit Note: Visit Date: 08/15/2019 PCP: Bennie Pierini, FNP Referred by: Daphine Deutscher, [name], *  Subjective: Chief Complaint  Patient presents with  . Left Hip - Pain    Fell Saturday and landed on hip. Points to buttock as area of pain. No pain radiates down leg unless she is climbing stairs. Taking Norco for pain. Pain level a 10.  . Right Shoulder - Pain    Fell Saturday. Pain radiates into shoulder. Having a difficult time writing. Pain level a 10.    HPI:  [name] is a 64 y.o. female who comes in today At the request of Dr. Doneen Poisson for diagnostic and hopefully therapeutic right intra-articular glenohumeral joint injection and left intra-articular hip injection.  We have seen the patient in the past on numerous occasion for greater trochanteric bursa injection and in the past we used to see her for lumbar spine injections.  She has since gone on to have lumbar fusion.  She is having pain on the right shoulder with movement and can hardly move her arm up and down.  She does have mostly posterior hip pain on the left.  ROS Otherwise per HPI.  Assessment & Plan: Visit Diagnoses:  1. Pain in left hip   2. Chronic right shoulder pain     Plan: Findings:  As noted on the individual report of the injection patient did seem to have some relief during the anesthetic phase I have asked her to keep notes for Dr. Magnus Ivan so she will know what exactly helped and did not help.  Interestingly quite a bit of pain with the injection itself high-level anxiety with the injection.    Meds & Orders: No orders of the defined types were placed in this encounter.   Orders Placed This Encounter  Procedures  . Large Joint Inj: L hip joint  . Large Joint Inj: R glenohumeral  . XR C-ARM NO REPORT    Follow-up: Return for Doneen Poisson, MD.   Procedures: Large Joint Inj: L hip  joint on 08/15/2019 8:22 AM Indications: diagnostic evaluation and pain Details: 22 G 3.5 in needle, fluoroscopy-guided anterior approach  Arthrogram: No  Medications: 4 mL bupivacaine 0.25 %; 60 mg triamcinolone acetonide 40 MG/ML Outcome: tolerated well, no immediate complications  There was excellent flow of contrast producing a partial arthrogram of the hip. The patient did have relief of symptoms during the anesthetic phase of the injection. Procedure, treatment alternatives, risks and benefits explained, specific risks discussed. Consent was given by the patient. Immediately prior to procedure a time out was called to verify the correct patient, procedure, equipment, support staff and site/side marked as required. Patient was prepped and draped in the usual sterile fashion.   Large Joint Inj: R glenohumeral on 08/15/2019 8:23 AM Indications: pain and diagnostic evaluation Details: 22 G 3.5 in needle, fluoroscopy-guided anteromedial approach  Arthrogram: No  Medications: 3 mL bupivacaine 0.5 %; 60 mg triamcinolone acetonide 40 MG/ML Outcome: tolerated well, no immediate complications  There was excellent flow of contrast producing a partial arthrogram of the glenohumeral joint. The patient did have relief of symptoms during the anesthetic phase of the injection. Procedure, treatment alternatives, risks and benefits explained, specific risks discussed. Consent was given by the patient. Immediately prior to procedure a time out was called to verify the correct patient, procedure, equipment, support staff and site/side marked as required. Patient was  prepped and draped in the usual sterile fashion.      No notes on file   Clinical History: DG HIP (WITH OR WITHOUT PELVIS) 2-3V LEFT  COMPARISON:  April 16, 2017  FINDINGS: Frontal pelvis as well as frontal and lateral left hip images were obtained. No acute fracture or dislocation evident. There is moderate osteoarthritic change  in both hip joints. No erosive change. There is postoperative change in the lower lumbar spine. Sacroiliac joints appear normal bilaterally.  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 No acute fracture or dislocation. Postoperative change lower lumbar spine.   Electronically Signed   By: Lowella Grip III M.D.   On: 08/10/2017 14:23     Objective:  VS:  HT:5\' 1"  (154.9 cm)   WT:150 lb (68 kg)  BMI:28.36    BP:   HR: bpm  TEMP: ( )  RESP:  Physical Exam   Imaging: XR C-ARM NO REPORT  Result Date: 08/15/2019 Please see Notes tab for imaging impression.

## 2019-08-15 NOTE — Progress Notes (Signed)
.     Numeric Pain Rating Scale and Functional Assessment Average Pain 10   In the last MONTH (on 0-10 scale) has pain interfered with the following?  1. General activity like being  able to carry out your everyday physical activities such as walking, climbing stairs, carrying groceries, or moving a chair?  Rating(7)   +Driver, -BT, -Dye Allergies.  

## 2019-08-16 ENCOUNTER — Telehealth: Payer: Self-pay | Admitting: Orthopaedic Surgery

## 2019-08-16 ENCOUNTER — Other Ambulatory Visit: Payer: Self-pay | Admitting: Specialist

## 2019-08-16 NOTE — Telephone Encounter (Signed)
[name] said next week will be fine, booked this week

## 2019-08-16 NOTE — Telephone Encounter (Signed)
She called back and scheduled with [name], so nevermind; we're good!

## 2019-08-16 NOTE — Telephone Encounter (Signed)
Seeing her next week will be fine.

## 2019-08-16 NOTE — Telephone Encounter (Signed)
Patient called.   She said that her injection from [facility] did not work so he told her to come back and see Bronson Curb or [place]. She feels she should be seen sooner than the next available  Call back: [phone]

## 2019-08-16 NOTE — Telephone Encounter (Signed)
Please advise  We have availability next week

## 2019-08-16 NOTE — Telephone Encounter (Signed)
Please advise  We have availability next week  

## 2019-08-22 ENCOUNTER — Other Ambulatory Visit: Payer: Self-pay | Admitting: Specialist

## 2019-08-22 ENCOUNTER — Ambulatory Visit (INDEPENDENT_AMBULATORY_CARE_PROVIDER_SITE_OTHER): Payer: 59 | Admitting: Physician Assistant

## 2019-08-22 ENCOUNTER — Other Ambulatory Visit: Payer: Self-pay

## 2019-08-22 ENCOUNTER — Telehealth: Payer: Self-pay | Admitting: Specialist

## 2019-08-22 ENCOUNTER — Encounter: Payer: Self-pay | Admitting: Physician Assistant

## 2019-08-22 DIAGNOSIS — M1612 Unilateral primary osteoarthritis, left hip: Secondary | ICD-10-CM | POA: Diagnosis not present

## 2019-08-22 DIAGNOSIS — M19011 Primary osteoarthritis, right shoulder: Secondary | ICD-10-CM | POA: Diagnosis not present

## 2019-08-22 NOTE — Telephone Encounter (Signed)
We rec'd refill request

## 2019-08-22 NOTE — Addendum Note (Signed)
Addended by: Mardene Celeste B on: 08/22/2019 04:17 PM   Modules accepted: Orders

## 2019-08-22 NOTE — Progress Notes (Signed)
Office Visit Note   Patient: [name]           Date of Birth: 10-Apr-1955           MRN: [id number] Visit Date: 08/22/2019              Requested by: Bennie Pierini, FNP [address] [place],  [place] [address] PCP: Bennie Pierini, FNP   Assessment & Plan: Visit Diagnoses:  1. Primary osteoarthritis, right shoulder   2. Unilateral primary osteoarthritis, left hip     Plan: Given her severe right shoulder pain that is not responded well to conservative treatment which included a intra-articular injection recommend that she see Dr. August Saucer for possible right shoulder replacement.  Questions were encouraged and answered.  In regards to her left hip she understands she needs to wait at least 6 months between injections intra-articularly.  She will follow-up with [name] as needed.  Follow-Up Instructions: Return Dr. August Saucer.   Orders:  No orders of the defined types were placed in this encounter.  No orders of the defined types were placed in this encounter.     Procedures: No procedures performed   Clinical Data: No additional findings.   Subjective: Chief Complaint  Patient presents with  . Right Shoulder - Pain, Follow-up  . Left Hip - Follow-up, Pain    HPI [name] comes in today in follow-up of her right shoulder and left hip.  She had intra-articular injections of both the shoulder and the hip.  States the left shoulder she is gotten some relief.  Still some discomfort with sitting and walking.  She feels that her left hip is about 50% better.  Her right shoulder however with the intra-articular injection only gave her some relief of the pain initially and once numbing agent wore off her pain returned.  She feels like the pain in the shoulder is worse now.  She has continuous pain radiates down to her right elbow.  She states she is unable to do her daily activities with her right arm.  She is right-hand dominant.  Again prior radiographs showed  arthritic changes the glenohumeral joint. Review of Systems Please see HPI otherwise negative  Objective: Vital Signs: There were no vitals taken for this visit.  Physical Exam General: Well-developed well-nourished female no acute distress Psych: Alert and oriented x3 Ortho Exam Left hip good range of motion without pain.  She ambulates without any assistive device. Right shoulder: Crepitus with passive range of motion.  Limited over head activity passively due to pain.  She has weakness with internal rotation against resistance right shoulder.  Specialty Comments:  No specialty comments available.  Imaging: No results found.   PMFS History: Patient Active Problem List   Diagnosis Date Noted  . Overweight (BMI 25.0-29.9) 04/15/2019  . Sigmoid diverticulitis 04/14/2019  . Chronic diastolic CHF (congestive heart failure) (HCC) 04/14/2019  . Chest pain 01/17/2019  . Unstable angina (HCC) 10/20/2017  . Chronic pain 09/23/2016  . Pre-diabetes 09/02/2016  . Recurrent major depressive disorder, in partial remission (HCC) 07/14/2016  . Seizures (HCC) 07/14/2016  . GAD (generalized anxiety disorder) 07/14/2016  . Insomnia 09/05/2014  . Allergic rhinitis 09/05/2014  . Cervical spondylosis without myelopathy 12/19/2013    Class: Chronic  . Neural foraminal stenosis of cervical spine 12/19/2013  . Cervical radiculitis 09/19/2013  . Lumbosacral spondylosis without myelopathy 11/16/2012  . Postlaminectomy syndrome, lumbar region 11/16/2012  . Herpes simplex virus (HSV) infection 10/31/2008  . Hyperlipidemia  with target LDL less than 100 10/31/2008  . Essential hypertension 10/31/2008  . GERD 10/31/2008  . SPINAL STENOSIS OF LUMBAR REGION 10/31/2008  . Myalgia 10/31/2008  . Osteoporosis 10/31/2008  . SPONDYLOLISTHESIS 10/31/2008  . SYNCOPE 10/31/2008  . Sleep apnea 10/31/2008   Past Medical History:  Diagnosis Date  . Anginal pain (HCC)    last time   . Anxiety   .  Arthritis    RHEUMATOID  . Asthma   . Bipolar 1 disorder (HCC)   . Cataracts, bilateral 07/2017  . COPD (chronic obstructive pulmonary disease) (HCC)   . Coronary artery disease    reported hx of "MI";  Echo 2009 with normal LVF;  Myoview 05/2011: no ischemia  . Depression   . Dyslipidemia   . Dysrhythmia    SVT  . Esophageal stricture   . Fibromyalgia   . GERD (gastroesophageal reflux disease)   . H/O hiatal hernia   . Head injury, unspecified   . Herpes simplex infection   . History of kidney stones   . Hyperlipidemia   . Hypertension   . Insomnia   . Myocardial infarction [facility])    age 54  . Osteoporosis   . Pneumonia    hx  . Seizures (HCC)    last 3 weeks ago- prescribed keppra -never took didnt like side effects read about online  . Shortness of breath   . Sleep apnea    ? neg  . Spinal stenosis of lumbar region   . Spondylolisthesis   . Status post placement of implantable loop recorder   . Supraventricular tachycardia (HCC)   . Syncope and collapse    s/p ILR; no arhythmogenic cause identified  . UTI (lower urinary tract infection)     Family History  Problem Relation Age of Onset  . Heart attack Father   . Cancer Father   . Mental illness Father   . Cancer Mother   . Mental illness Mother   . Heart attack Brother        stents  . Alcohol abuse Brother   . Heart disease Brother   . Drug abuse Brother   . Diabetes Brother   . Colon cancer Maternal Aunt   . Cirrhosis Brother   . Stomach cancer Neg Hx     Past Surgical History:  Procedure Laterality Date  . BACK SURGERY    . BREAST SURGERY     lumpectomy  . CATARACT EXTRACTION W/PHACO Right 07/31/2017   Procedure: CATARACT EXTRACTION PHACO AND INTRAOCULAR LENS PLACEMENT (IOC);  Surgeon: Fabio Pierce, MD;  Location: [facility];  Service: Ophthalmology;  Laterality: Right;  CDE: 2.33  . CATARACT EXTRACTION W/PHACO Left 08/14/2017   Procedure: CATARACT EXTRACTION PHACO AND INTRAOCULAR LENS PLACEMENT (IOC);   Surgeon: Fabio Pierce, MD;  Location: [facility];  Service: Ophthalmology;  Laterality: Left;  CDE: 2.74  . CHOLECYSTECTOMY    . CYSTOSCOPY     stone  . DOPPLER ECHOCARDIOGRAPHY  2009  . head up tilt table testing  06/15/2007   [name]  . HEMORRHOID SURGERY    . insertion of implatable loop recorder  08/11/2007   [name]  . POSTERIOR CERVICAL FUSION/FORAMINOTOMY N/A 12/19/2013   Procedure: RIGHT C3-4.C4-5 AND C5-6 FORAMINOTOMIES;  Surgeon: Kerrin Champagne, MD;  Location[facility] OR;  Service: Orthopedics;  Laterality: N/A;  . TUBAL LIGATION     Social History   Occupational History  . Occupation: Disability    Comment: 15 years  Tobacco  Use  . Smoking status: Never Smoker  . Smokeless tobacco: Never Used  Substance and Sexual Activity  . Alcohol use: No    Alcohol/week: 0.0 standard drinks  . Drug use: No  . Sexual activity: Not Currently

## 2019-08-22 NOTE — Telephone Encounter (Signed)
Pt wanted to inform Dr. Otelia Sergeant the name of the medicine was chlorzoxazone 500 mg

## 2019-08-23 ENCOUNTER — Ambulatory Visit: Payer: 59 | Admitting: Cardiology

## 2019-08-25 ENCOUNTER — Encounter: Payer: 59 | Admitting: Family Medicine

## 2019-08-25 ENCOUNTER — Encounter: Payer: Self-pay | Admitting: Family Medicine

## 2019-08-25 ENCOUNTER — Ambulatory Visit (INDEPENDENT_AMBULATORY_CARE_PROVIDER_SITE_OTHER): Payer: 59 | Admitting: Family Medicine

## 2019-08-25 DIAGNOSIS — B9689 Other specified bacterial agents as the cause of diseases classified elsewhere: Secondary | ICD-10-CM | POA: Diagnosis not present

## 2019-08-25 DIAGNOSIS — N760 Acute vaginitis: Secondary | ICD-10-CM | POA: Diagnosis not present

## 2019-08-25 MED ORDER — METRONIDAZOLE 500 MG PO TABS: 500.0000 mg | ORAL_TABLET | Freq: Two times a day (BID) | ORAL | 0 refills | Status: DC

## 2019-08-25 NOTE — Progress Notes (Signed)
Attempted to call patient multiple times.  Voicemail left.  Never received return call.
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
## 2019-08-26 ENCOUNTER — Ambulatory Visit: Payer: 59

## 2019-09-01 ENCOUNTER — Ambulatory Visit: Payer: 59 | Admitting: Orthopaedic Surgery

## 2019-09-06 ENCOUNTER — Ambulatory Visit (INDEPENDENT_AMBULATORY_CARE_PROVIDER_SITE_OTHER): Payer: 59 | Admitting: Family Medicine

## 2019-09-06 ENCOUNTER — Encounter: Payer: Self-pay | Admitting: Family Medicine

## 2019-09-06 DIAGNOSIS — J209 Acute bronchitis, unspecified: Secondary | ICD-10-CM

## 2019-09-06 DIAGNOSIS — J440 Chronic obstructive pulmonary disease with acute lower respiratory infection: Secondary | ICD-10-CM | POA: Diagnosis not present

## 2019-09-06 MED ORDER — GUAIFENESIN-CODEINE 100-10 MG/5ML PO SOLN: 10.0000 mL | Freq: Four times a day (QID) | ORAL | 0 refills | Status: DC | PRN

## 2019-09-06 MED ORDER — METHYLPREDNISOLONE 4 MG PO TBPK: ORAL_TABLET | ORAL | 0 refills | Status: DC

## 2019-09-06 NOTE — Patient Instructions (Signed)

## 2019-09-06 NOTE — Progress Notes (Signed)
Virtual Visit via Telephone Note  I connected with [name] on 09/06/19 at 2:45 PM by telephone and verified that I am speaking with the correct person using two identifiers. [name] is currently located in [place] and nobody is currently with her during this visit. The provider, Loman Brooklyn, FNP is located in their home at time of visit.  I discussed the limitations, risks, security and privacy concerns of performing an evaluation and management service by telephone and the availability of in person appointments. I also discussed with the patient that there may be a patient responsible charge related to this service. The patient expressed understanding and agreed to proceed.  Subjective: PCP: Erica Pretty, FNP  Chief Complaint  Patient presents with  . Cough   Patient complains of cough and wheezing. Onset of symptoms was 3 days ago, gradually worsening since that time. She is drinking plenty of fluids. Evaluation to date: none. Treatment to date: Breo & Albuterol. She has a history of COPD. She does not smoke.    ROS: Per HPI  Current Outpatient Medications:  .  acyclovir ointment (ZOVIRAX) 5 %, APPLY OINTMENT TO AFFECTED AREA EVERY 3 HOURS. (Patient taking differently: Apply 1 application topically daily as needed. ), Disp: 30 g, Rfl: 0 .  albuterol (PROVENTIL) (2.5 MG/3ML) 0.083% nebulizer solution, USE 1 VIAL IN NEBULIZER EVERY 4 HOURS AS NEEDED FOR WHEEZING. (Patient taking differently: Take 2.5 mg by nebulization every 4 (four) hours as needed for wheezing. ), Disp: 180 mL, Rfl: 0 .  albuterol (VENTOLIN HFA) 108 (90 Base) MCG/ACT inhaler, INHALE 2 PUFFS EVERY 6 HOURS AS NEEDED FOR SHORTNESS OF BREATH AND WHEEZING. (Patient taking differently: Inhale 2 puffs into the lungs every 6 (six) hours as needed for wheezing or shortness of breath. ), Disp: 8.5 g, Rfl: 0 .  Alcohol Swabs (GLOBAL ALCOHOL PREP EASE) 70 % PADS, USE 1 PAD DAILY WHEN CHECKING SUGAR., Disp:  100 each, Rfl: 11 .  busPIRone (BUSPAR) 10 MG tablet, TAKE 1 TABLET BY MOUTH TWICE DAILY AS NEEDED., Disp: 60 tablet, Rfl: 0 .  butalbital-acetaminophen-caffeine (FIORICET) 50-325-40 MG tablet, Take 1 tablet by mouth every 6 (six) hours as needed for headache., Disp: 20 tablet, Rfl: 1 .  carvedilol (COREG) 6.25 MG tablet, Take 1 tablet (6.25 mg total) by mouth 2 (two) times daily with a meal., Disp: 180 tablet, Rfl: 1 .  celecoxib (CELEBREX) 200 MG capsule, TAKE 1 CAPSULE DAILY FOR 7 DAYS, THEN TAKE 1 CAPSULE 2 TIMES A DAY FOR 21 DAYS., Disp: 49 capsule, Rfl: 0 .  chlorzoxazone (PARAFON) 500 MG tablet, TAKE (1) TABLET EVERY SIX HOURS AS NEEDED FOR MUSCLE SPASMS., Disp: 30 tablet, Rfl: 0 .  dexlansoprazole (DEXILANT) 60 MG capsule, Take 1 capsule (60 mg total) by mouth daily., Disp: 90 capsule, Rfl: 1 .  diclofenac Sodium (VOLTAREN) 1 % GEL, Apply 4 g topically 4 (four) times daily., Disp: 500 g, Rfl: 3 .  DULoxetine (CYMBALTA) 60 MG capsule, Take 1 capsule (60 mg total) by mouth at bedtime., Disp: 90 capsule, Rfl: 1 .  escitalopram (LEXAPRO) 20 MG tablet, TAKE 1 TABLET ONCE DAILY., Disp: 30 tablet, Rfl: 2 .  estradiol (ESTRACE) 0.1 MG/GM vaginal cream, PLACE 1 APPLICATORFUL VAGINALLY AT BEDTIME., Disp: 42.5 g, Rfl: 0 .  fludrocortisone (FLORINEF) 0.1 MG tablet, TAKE 1 TABLET EVERY OTHER DAY., Disp: 45 tablet, Rfl: 1 .  fluticasone (FLONASE) 50 MCG/ACT nasal spray, SPRAY 2 SPRAYS IN EACH NOSTRIL ONCE DAILY. (Patient  taking differently: Place 1 spray into both nostrils daily as needed for allergies or rhinitis. ), Disp: 16 g, Rfl: 0 .  fluticasone furoate-vilanterol (BREO ELLIPTA) 200-25 MCG/INH AEPB, Inhale 1 puff into the lungs daily., Disp: 60 each, Rfl: 5 .  furosemide (LASIX) 40 MG tablet, TAKE 1 TABLET ONCE DAILY AS NEEDED FOR FLUID., Disp: 90 tablet, Rfl: 0 .  [START ON 10/07/2019] HYDROcodone-acetaminophen (NORCO) 7.5-325 MG tablet, Take 1 tablet by mouth in the morning and at bedtime., Disp: 60  tablet, Rfl: 0 .  [START ON 09/07/2019] HYDROcodone-acetaminophen (NORCO) 7.5-325 MG tablet, Take 1 tablet by mouth in the morning and at bedtime., Disp: 60 tablet, Rfl: 0 .  HYDROcodone-acetaminophen (NORCO) 7.5-325 MG tablet, Take 1 tablet by mouth every 6 (six) hours as needed for moderate pain., Disp: 60 tablet, Rfl: 0 .  ipratropium (ATROVENT) 0.02 % nebulizer solution, USE 1 VIAL IN NEBULIZER EVERY 4 HOURS AS NEEDED FOR WHEEZING., Disp: 150 mL, Rfl: 2 .  isosorbide mononitrate (IMDUR) 30 MG 24 hr tablet, Take 1 tablet (30 mg total) by mouth daily., Disp: 90 tablet, Rfl: 1 .  lamoTRIgine (LAMICTAL) 150 MG tablet, Take 1 tablet (150 mg total) by mouth daily., Disp: 90 tablet, Rfl: 1 .  Lancets (ONETOUCH DELICA PLUS LANCET33G) MISC, USE TO CHECK SUGAR DAILY., Disp: 50 each, Rfl: 5 .  levETIRAcetam (KEPPRA) 500 MG tablet, TAKE (1) TABLET TWICE DAILY., Disp: 180 tablet, Rfl: 1 .  levocetirizine (XYZAL) 5 MG tablet, TAKE 1 TABLET BY MOUTH EVERY MORNING. (Patient taking differently: Take 5 mg by mouth daily as needed for allergies. ), Disp: 30 tablet, Rfl: 11 .  lidocaine (XYLOCAINE) 5 % ointment, APPLY AFFECTED AREA THREE TIMES DAILY AS NEEDED FOR MILD OR MODERATE PAIN. (Patient taking differently: Apply 1 application topically 3 (three) times daily as needed for mild pain or moderate pain. ), Disp: 50 g, Rfl: 0 .  metroNIDAZOLE (FLAGYL) 500 MG tablet, Take 1 tablet (500 mg total) by mouth 2 (two) times daily., Disp: 14 tablet, Rfl: 0 .  nitroGLYCERIN (NITROSTAT) 0.4 MG SL tablet, PLACE ONE (1) TABLET UNDER TONGUE EVERY 5 MINUTES UP TO (3) DOSES AS NEEDED FOR CHEST PAIN., Disp: 25 tablet, Rfl: 0 .  ondansetron (ZOFRAN) 4 MG tablet, TAKE 1 TABLET BY MOUTH EVERY 8 HOURS AS NEEDED FOR NAUSEA AND VOMITING. (Patient taking differently: Take 4 mg by mouth every 8 (eight) hours as needed for nausea or vomiting. ), Disp: 20 tablet, Rfl: 0 .  oxybutynin (DITROPAN) 5 MG tablet, Take 1 tablet (5 mg total) by mouth  at bedtime., Disp: 90 tablet, Rfl: 1 .  rosuvastatin (CRESTOR) 10 MG tablet, Take 1 tablet (10 mg total) by mouth daily., Disp: 90 tablet, Rfl: 1 .  sucralfate (CARAFATE) 1 g tablet, Take 1 tablet (1 g total) by mouth 4 (four) times daily -  with meals and at bedtime., Disp: 90 tablet, Rfl: 1 .  traZODone (DESYREL) 150 MG tablet, Take 1 tablet (150 mg total) by mouth at bedtime., Disp: 90 tablet, Rfl: 1 .  valACYclovir (VALTREX) 500 MG tablet, Take 1 tablet (500 mg total) by mouth 2 (two) times daily., Disp: 180 tablet, Rfl: 1  Allergies  Allergen Reactions  . Codeine Other (See Comments)    "I will have a heart attack."  . Morphine And Related Other (See Comments)    "It will cause me to have a heart attack."  . Ambien [Zolpidem Tartrate] Nausea And Vomiting  . Lyrica [Pregabalin] Swelling and  Other (See Comments)    Weight gain  . Neurontin [Gabapentin] Other (See Comments)    Causes elevated LFTs    Past Medical History:  Diagnosis Date  . Anginal pain (HCC)    last time   . Anxiety   . Arthritis    RHEUMATOID  . Asthma   . Bipolar 1 disorder (HCC)   . Cataracts, bilateral 07/2017  . COPD (chronic obstructive pulmonary disease) (HCC)   . Coronary artery disease    reported hx of "MI";  Echo 2009 with normal LVF;  Myoview 05/2011: no ischemia  . Depression   . Dyslipidemia   . Dysrhythmia    SVT  . Esophageal stricture   . Fibromyalgia   . GERD (gastroesophageal reflux disease)   . H/O hiatal hernia   . Head injury, unspecified   . Herpes simplex infection   . History of kidney stones   . Hyperlipidemia   . Hypertension   . Insomnia   . Myocardial infarction [facility])    age 23  . Osteoporosis   . Pneumonia    hx  . Seizures (HCC)    last 3 weeks ago- prescribed keppra -never took didnt like side effects read about online  . Shortness of breath   . Sleep apnea    ? neg  . Spinal stenosis of lumbar region   . Spondylolisthesis   . Status post placement of  implantable loop recorder   . Supraventricular tachycardia (HCC)   . Syncope and collapse    s/p ILR; no arhythmogenic cause identified  . UTI (lower urinary tract infection)     Observations/Objective: A&O  No respiratory distress audible over the phone. Patient is wheezing and coughing.  Mood, judgement, and thought processes all WNL  Assessment and Plan: 1. Acute bronchitis with COPD (HCC) - Education provided on acute bronchitis.  - methylPREDNISolone (MEDROL DOSEPAK) 4 MG TBPK tablet; Use as directed.  Dispense: 21 each; Refill: 0 - guaiFENesin-codeine 100-10 MG/5ML syrup; Take 10 mLs by mouth every 6 (six) hours as needed for cough.  Dispense: 180 mL; Refill: 0   Follow Up Instructions:  I discussed the assessment and treatment plan with the patient. The patient was provided an opportunity to ask questions and all were answered. The patient agreed with the plan and demonstrated an understanding of the instructions.   The patient was advised to call back or seek an in-person evaluation if the symptoms worsen or if the condition fails to improve as anticipated.  The above assessment and management plan was discussed with the patient. The patient verbalized understanding of and has agreed to the management plan. Patient is aware to call the clinic if symptoms persist or worsen. Patient is aware when to return to the clinic for a follow-up visit. Patient educated on when it is appropriate to go to the emergency department.   Time call ended: 2:53 PM  I provided 10 minutes of non-face-to-face time during this encounter.  Deliah Boston, MSN, APRN, FNP-C [facility] 09/06/19

## 2019-09-07 ENCOUNTER — Other Ambulatory Visit: Payer: Self-pay | Admitting: Nurse Practitioner

## 2019-09-07 DIAGNOSIS — K2100 Gastro-esophageal reflux disease with esophagitis, without bleeding: Secondary | ICD-10-CM

## 2019-09-10 ENCOUNTER — Other Ambulatory Visit: Payer: Self-pay | Admitting: Cardiology

## 2019-09-10 ENCOUNTER — Other Ambulatory Visit: Payer: Self-pay | Admitting: Nurse Practitioner

## 2019-09-10 ENCOUNTER — Other Ambulatory Visit: Payer: Self-pay | Admitting: Specialist

## 2019-09-10 DIAGNOSIS — G44229 Chronic tension-type headache, not intractable: Secondary | ICD-10-CM

## 2019-09-12 NOTE — Telephone Encounter (Signed)
Pls advise.  

## 2019-09-14 ENCOUNTER — Other Ambulatory Visit: Payer: Self-pay

## 2019-09-14 ENCOUNTER — Encounter: Payer: Self-pay | Admitting: Family Medicine

## 2019-09-14 ENCOUNTER — Telehealth (INDEPENDENT_AMBULATORY_CARE_PROVIDER_SITE_OTHER): Payer: 59 | Admitting: Family Medicine

## 2019-09-14 DIAGNOSIS — J40 Bronchitis, not specified as acute or chronic: Secondary | ICD-10-CM | POA: Diagnosis not present

## 2019-09-14 MED ORDER — GUAIFENESIN-DM 100-10 MG/5ML PO SYRP: 5.0000 mL | ORAL_SOLUTION | ORAL | 0 refills | Status: DC | PRN

## 2019-09-14 MED ORDER — AZITHROMYCIN 250 MG PO TABS: ORAL_TABLET | ORAL | 0 refills | Status: DC

## 2019-09-14 MED ORDER — PREDNISONE 20 MG PO TABS: ORAL_TABLET | ORAL | 0 refills | Status: DC

## 2019-09-14 NOTE — Progress Notes (Signed)
Virtual Visit via mychart video Note  I connected with [name] on 09/14/19 at 1658 by video and verified that I am speaking with the correct person using two identifiers. [name] is currently located at home and no other people are currently with her during visit. The provider, Elige Radon Shivon Hackel, MD is located in their office at time of visit.  Call ended at 1707  I discussed the limitations, risks, security and privacy concerns of performing an evaluation and management service by video and the availability of in person appointments. I also discussed with the patient that there may be a patient responsible charge related to this service. The patient expressed understanding and agreed to proceed.   History and Present Illness: Patient is having her asthma flare. She is having coughing and wheezing and yellow drainage.  She denies fevers or chills. She did a steroid taper pack and it helped but did not clear it.  She is not short of breath. She has productive mucous.  She tried robitussin.    1. Bronchitis     Outpatient Encounter Medications as of 09/14/2019  Medication Sig  . acyclovir ointment (ZOVIRAX) 5 % APPLY OINTMENT TO AFFECTED AREA EVERY 3 HOURS. (Patient taking differently: Apply 1 application topically daily as needed. )  . albuterol (PROVENTIL) (2.5 MG/3ML) 0.083% nebulizer solution USE 1 VIAL IN NEBULIZER EVERY 4 HOURS AS NEEDED FOR WHEEZING. (Patient taking differently: Take 2.5 mg by nebulization every 4 (four) hours as needed for wheezing. )  . albuterol (VENTOLIN HFA) 108 (90 Base) MCG/ACT inhaler INHALE 2 PUFFS EVERY 6 HOURS AS NEEDED FOR SHORTNESS OF BREATH AND WHEEZING.  [name] Alcohol Swabs (GLOBAL ALCOHOL PREP EASE) 70 % PADS USE 1 PAD DAILY WHEN CHECKING BLOOD SUGAR. R73.03  . azithromycin (ZITHROMAX) 250 MG tablet Take 2 the first day and then one each day after.  . busPIRone (BUSPAR) 10 MG tablet TAKE 1 TABLET BY MOUTH TWICE DAILY AS NEEDED.  [name]  butalbital-acetaminophen-caffeine (FIORICET) 50-325-40 MG tablet TAKE 1 TABLET BY MOUTH EVERY 6 HOURS AS NEEDED FOR HEADACHE.  . carvedilol (COREG) 6.25 MG tablet TAKE 1 & 1/2 TABLETS BY MOUTH TWICE DAILY.  . celecoxib (CELEBREX) 200 MG capsule TAKE 1 CAPSULE DAILY FOR 7 DAYS, THEN TAKE 1 CAPSULE 2 TIMES A DAY FOR 21 DAYS.  [name] chlorzoxazone (PARAFON) 500 MG tablet TAKE (1) TABLET EVERY SIX HOURS AS NEEDED FOR MUSCLE SPASMS.  [name] dexlansoprazole (DEXILANT) 60 MG capsule Take 1 capsule (60 mg total) by mouth daily.  . diclofenac Sodium (VOLTAREN) 1 % GEL Apply 4 g topically 4 (four) times daily.  . DULoxetine (CYMBALTA) 60 MG capsule Take 1 capsule (60 mg total) by mouth at bedtime.  [name] escitalopram (LEXAPRO) 20 MG tablet TAKE 1 TABLET ONCE DAILY.  [name] estradiol (ESTRACE) 0.1 MG/GM vaginal cream PLACE 1 APPLICATORFUL VAGINALLY AT BEDTIME.  . fludrocortisone (FLORINEF) 0.1 MG tablet TAKE 1 TABLET EVERY OTHER DAY.  . fluticasone (FLONASE) 50 MCG/ACT nasal spray SPRAY 2 SPRAYS IN EACH NOSTRIL ONCE DAILY. (Patient taking differently: Place 1 spray into both nostrils daily as needed for allergies or rhinitis. )  . fluticasone furoate-vilanterol (BREO ELLIPTA) 200-25 MCG/INH AEPB Inhale 1 puff into the lungs daily.  . furosemide (LASIX) 40 MG tablet TAKE 1 TABLET ONCE DAILY AS NEEDED FOR FLUID.  [name] guaiFENesin-codeine 100-10 MG/5ML syrup Take 10 mLs by mouth every 6 (six) hours as needed for cough.  [name] guaiFENesin-dextromethorphan (ROBITUSSIN DM) 100-10 MG/5ML syrup Take 5  mLs by mouth every 4 (four) hours as needed for cough.  Melene Muller ON 10/07/2019] HYDROcodone-acetaminophen (NORCO) 7.5-325 MG tablet Take 1 tablet by mouth in the morning and at bedtime.  [name] HYDROcodone-acetaminophen (NORCO) 7.5-325 MG tablet Take 1 tablet by mouth in the morning and at bedtime.  [name] ipratropium (ATROVENT) 0.02 % nebulizer solution USE 1 VIAL IN NEBULIZER EVERY 4 HOURS AS NEEDED FOR WHEEZING.  . isosorbide mononitrate (IMDUR) 30 MG 24  hr tablet Take 1 tablet (30 mg total) by mouth daily.  [name] lamoTRIgine (LAMICTAL) 150 MG tablet Take 1 tablet (150 mg total) by mouth daily.  . Lancets (ONETOUCH DELICA PLUS LANCET33G) MISC USE TO CHECK SUGAR DAILY.  [name] levETIRAcetam (KEPPRA) 500 MG tablet TAKE (1) TABLET TWICE DAILY.  [name] levocetirizine (XYZAL) 5 MG tablet TAKE 1 TABLET BY MOUTH EVERY MORNING. (Patient taking differently: Take 5 mg by mouth daily as needed for allergies. )  . lidocaine (XYLOCAINE) 5 % ointment APPLY AFFECTED AREA THREE TIMES DAILY AS NEEDED FOR MILD OR MODERATE PAIN. (Patient taking differently: Apply 1 application topically 3 (three) times daily as needed for mild pain or moderate pain. )  . methylPREDNISolone (MEDROL DOSEPAK) 4 MG TBPK tablet Use as directed.  . metroNIDAZOLE (FLAGYL) 500 MG tablet Take 1 tablet (500 mg total) by mouth 2 (two) times daily.  . nitroGLYCERIN (NITROSTAT) 0.4 MG SL tablet PLACE ONE (1) TABLET UNDER TONGUE EVERY 5 MINUTES UP TO (3) DOSES AS NEEDED FOR CHEST PAIN.  [name] ondansetron (ZOFRAN) 4 MG tablet TAKE 1 TABLET BY MOUTH EVERY 8 HOURS AS NEEDED FOR NAUSEA AND VOMITING.  [name] oxybutynin (DITROPAN) 5 MG tablet Take 1 tablet (5 mg total) by mouth at bedtime.  . predniSONE (DELTASONE) 20 MG tablet Take 3 tabs daily for 1 week, then 2 tabs daily for week 2, then 1 tab daily for week 3.  . rosuvastatin (CRESTOR) 10 MG tablet Take 1 tablet (10 mg total) by mouth daily.  . sucralfate (CARAFATE) 1 g tablet TAKE 1 TABLET BY MOUTH 4 TIMES DAILY - WITH MEALS AND AT BEDTIME.  . traZODone (DESYREL) 150 MG tablet Take 1 tablet (150 mg total) by mouth at bedtime.  . valACYclovir (VALTREX) 500 MG tablet Take 1 tablet (500 mg total) by mouth 2 (two) times daily.   No facility-administered encounter medications on file as of 09/14/2019.    Review of Systems  Constitutional: Negative for chills and fever.  HENT: Positive for congestion, postnasal drip, rhinorrhea, sinus pressure, sneezing and sore throat.  Negative for ear discharge and ear pain.   Eyes: Negative for pain, redness and visual disturbance.  Respiratory: Positive for cough and wheezing. Negative for chest tightness and shortness of breath.   Cardiovascular: Negative for chest pain and leg swelling.  Genitourinary: Negative for difficulty urinating and dysuria.  Musculoskeletal: Negative for back pain and gait problem.  Skin: Negative for rash.  Neurological: Negative for light-headedness and headaches.  Psychiatric/Behavioral: Negative for agitation and behavioral problems.  All other systems reviewed and are negative.   Observations/Objective: Patient has cough and wheezing on video  Assessment and Plan: Problem List Items Addressed This Visit    None    Visit Diagnoses    Bronchitis    -  Primary   Relevant Medications   predniSONE (DELTASONE) 20 MG tablet   azithromycin (ZITHROMAX) 250 MG tablet   guaiFENesin-dextromethorphan (ROBITUSSIN DM) 100-10 MG/5ML syrup      Will treat like bronchitis or possible exacerbation of reactive  airway disease. Follow up plan: Return if symptoms worsen or fail to improve.     I discussed the assessment and treatment plan with the patient. The patient was provided an opportunity to ask questions and all were answered. The patient agreed with the plan and demonstrated an understanding of the instructions.   The patient was advised to call back or seek an in-person evaluation if the symptoms worsen or if the condition fails to improve as anticipated.  The above assessment and management plan was discussed with the patient. The patient verbalized understanding of and has agreed to the management plan. Patient is aware to call the clinic if symptoms persist or worsen. Patient is aware when to return to the clinic for a follow-up visit. Patient educated on when it is appropriate to go to the emergency department.    I provided 9 minutes of non-face-to-face time during this  encounter.    Worthy Rancher, MD

## 2019-09-19 ENCOUNTER — Other Ambulatory Visit: Payer: Self-pay

## 2019-09-19 ENCOUNTER — Ambulatory Visit (INDEPENDENT_AMBULATORY_CARE_PROVIDER_SITE_OTHER): Payer: 59 | Admitting: Orthopedic Surgery

## 2019-09-19 DIAGNOSIS — M19011 Primary osteoarthritis, right shoulder: Secondary | ICD-10-CM

## 2019-09-21 ENCOUNTER — Encounter: Payer: Self-pay | Admitting: Orthopedic Surgery

## 2019-09-21 ENCOUNTER — Other Ambulatory Visit: Payer: Self-pay | Admitting: Nurse Practitioner

## 2019-09-21 DIAGNOSIS — K2100 Gastro-esophageal reflux disease with esophagitis, without bleeding: Secondary | ICD-10-CM

## 2019-09-21 NOTE — Progress Notes (Signed)
Office Visit Note   Patient: [name]           Date of Birth: 02/24/1956           MRN: [id number] Visit Date: 09/19/2019 Requested by: Kirtland Bouchard, PA-C [address] [place],  [place] [address] PCP: Bennie Pierini, FNP  Subjective: Chief Complaint  Patient presents with  . Right Shoulder - Pain    HPI: Talmadge Chad is a 64 year old patient with chronic right shoulder pain for a year.  Started after her dog jumped down onto the shoulder.  Reports constant pain in the shoulder.  She does have occasional falls.  She has known left hip arthritis but her right shoulder is bothering her more than the hip.  Had a shoulder injection at the end of May which gave her decent but short-lived relief.  Her shoulder has been better for the last few weeks due to the fact that she is taking Zithromax and prednisone for bronchitis.  She denies any radicular symptoms affecting the right arm.  Right shoulder radiographs from March are reviewed.  It does show moderate to severe glenohumeral joint arthritis with maintenance of the acromiohumeral distance and no significant glenoid deformity.              ROS: All systems reviewed are negative as they relate to the chief complaint within the history of present illness.  Patient denies  fevers or chills.   Assessment & Plan: Visit Diagnoses:  1. Primary osteoarthritis, right shoulder     Plan: Impression is right shoulder pain in a patient who has reasonable rotator cuff strength who has failed nonoperative treatment.  Plan is thin cut CT scan for preop evaluation and planning.  Did discuss with butterfly today the risk and benefits of shoulder replacement surgery including but not limited to infection nerve vessel damage dislocation as well as potential need for revision in her lifetime.  I do think she would be a good candidate for total shoulder replacement.  Potentially could consider stemless implant in this patient.  All questions  answered.  Follow-Up Instructions: Return for after MRI.   Orders:  Orders Placed This Encounter  Procedures  . CT SHOULDER RIGHT WO CONTRAST   No orders of the defined types were placed in this encounter.     Procedures: No procedures performed   Clinical Data: No additional findings.  Objective: Vital Signs: There were no vitals taken for this visit.  Physical Exam:   Constitutional: Patient appears well-developed HEENT:  Head: Normocephalic Eyes:EOM are normal Neck: Normal range of motion Cardiovascular: Normal rate Pulmonary/chest: Effort normal Neurologic: Patient is alert Skin: Skin is warm Psychiatric: Patient has normal mood and affect    Ortho Exam: Ortho exam demonstrates good cervical spine range of motion.  Patient has pretty well-maintained external rotation to about 45 degrees bilaterally with good rotator cuff strength on the right-hand side infraspinatus supraspinatus and subscap muscle testing.  Passive forward flexion is to 180 and active forward flexion is to 170 bilaterally.  She does have some coarseness with range of motion but it is more bone-on-bone type coarseness.  Motor or sensory function to the hand is intact.  Specialty Comments:  No specialty comments available.  Imaging: No results found.   PMFS History: Patient Active Problem List   Diagnosis Date Noted  . Overweight (BMI 25.0-29.9) 04/15/2019  . Sigmoid diverticulitis 04/14/2019  . Chronic diastolic CHF (congestive heart failure) (HCC) 04/14/2019  . Chest pain 01/17/2019  .  Unstable angina (HCC) 10/20/2017  . Chronic pain 09/23/2016  . Pre-diabetes 09/02/2016  . Recurrent major depressive disorder, in partial remission (HCC) 07/14/2016  . Seizures (HCC) 07/14/2016  . GAD (generalized anxiety disorder) 07/14/2016  . Insomnia 09/05/2014  . Allergic rhinitis 09/05/2014  . Cervical spondylosis without myelopathy 12/19/2013    Class: Chronic  . Neural foraminal stenosis of  cervical spine 12/19/2013  . Cervical radiculitis 09/19/2013  . Lumbosacral spondylosis without myelopathy 11/16/2012  . Postlaminectomy syndrome, lumbar region 11/16/2012  . Herpes simplex virus (HSV) infection 10/31/2008  . Hyperlipidemia with target LDL less than 100 10/31/2008  . Essential hypertension 10/31/2008  . GERD 10/31/2008  . SPINAL STENOSIS OF LUMBAR REGION 10/31/2008  . Myalgia 10/31/2008  . Osteoporosis 10/31/2008  . SPONDYLOLISTHESIS 10/31/2008  . SYNCOPE 10/31/2008  . Sleep apnea 10/31/2008   Past Medical History:  Diagnosis Date  . Anginal pain (HCC)    last time   . Anxiety   . Arthritis    RHEUMATOID  . Asthma   . Bipolar 1 disorder (HCC)   . Cataracts, bilateral 07/2017  . COPD (chronic obstructive pulmonary disease) (HCC)   . Coronary artery disease    reported hx of "MI";  Echo 2009 with normal LVF;  Myoview 05/2011: no ischemia  . Depression   . Dyslipidemia   . Dysrhythmia    SVT  . Esophageal stricture   . Fibromyalgia   . GERD (gastroesophageal reflux disease)   . H/O hiatal hernia   . Head injury, unspecified   . Herpes simplex infection   . History of kidney stones   . Hyperlipidemia   . Hypertension   . Insomnia   . Myocardial infarction [facility])    age 69  . Osteoporosis   . Pneumonia    hx  . Seizures (HCC)    last 3 weeks ago- prescribed keppra -never took didnt like side effects read about online  . Shortness of breath   . Sleep apnea    ? neg  . Spinal stenosis of lumbar region   . Spondylolisthesis   . Status post placement of implantable loop recorder   . Supraventricular tachycardia (HCC)   . Syncope and collapse    s/p ILR; no arhythmogenic cause identified  . UTI (lower urinary tract infection)     Family History  Problem Relation Age of Onset  . Heart attack Father   . Cancer Father   . Mental illness Father   . Cancer Mother   . Mental illness Mother   . Heart attack Brother        stents  . Alcohol abuse  Brother   . Heart disease Brother   . Drug abuse Brother   . Diabetes Brother   . Colon cancer Maternal Aunt   . Cirrhosis Brother   . Stomach cancer Neg Hx     Past Surgical History:  Procedure Laterality Date  . BACK SURGERY    . BREAST SURGERY     lumpectomy  . CATARACT EXTRACTION W/PHACO Right 07/31/2017   Procedure: CATARACT EXTRACTION PHACO AND INTRAOCULAR LENS PLACEMENT (IOC);  Surgeon: Fabio Pierce, MD;  Location: [facility];  Service: Ophthalmology;  Laterality: Right;  CDE: 2.33  . CATARACT EXTRACTION W/PHACO Left 08/14/2017   Procedure: CATARACT EXTRACTION PHACO AND INTRAOCULAR LENS PLACEMENT (IOC);  Surgeon: Fabio Pierce, MD;  Location: [facility];  Service: Ophthalmology;  Laterality: Left;  CDE: 2.74  . CHOLECYSTECTOMY    . CYSTOSCOPY  stone  . DOPPLER ECHOCARDIOGRAPHY  2009  . head up tilt table testing  06/15/2007   [name]  . HEMORRHOID SURGERY    . insertion of implatable loop recorder  08/11/2007   [name]  . POSTERIOR CERVICAL FUSION/FORAMINOTOMY N/A 12/19/2013   Procedure: RIGHT C3-4.C4-5 AND C5-6 FORAMINOTOMIES;  Surgeon: [name], MD;  Location: [facility];  Service: Orthopedics;  Laterality: N/A;  . TUBAL LIGATION     Social History   Occupational History  . Occupation: Disability    Comment: 15 years  Tobacco Use  . Smoking status: Never Smoker  . Smokeless tobacco: Never Used  Vaping Use  . Vaping Use: Never used  Substance and Sexual Activity  . Alcohol use: No    Alcohol/week: 0.0 standard drinks  . Drug use: No  . Sexual activity: Not Currently

## 2019-09-29 ENCOUNTER — Other Ambulatory Visit: Payer: Self-pay

## 2019-09-29 ENCOUNTER — Other Ambulatory Visit: Payer: Self-pay | Admitting: Specialist

## 2019-09-29 ENCOUNTER — Ambulatory Visit (INDEPENDENT_AMBULATORY_CARE_PROVIDER_SITE_OTHER): Payer: 59 | Admitting: Nurse Practitioner

## 2019-09-29 ENCOUNTER — Encounter: Payer: Self-pay | Admitting: Nurse Practitioner

## 2019-09-29 ENCOUNTER — Other Ambulatory Visit: Payer: Self-pay | Admitting: Cardiology

## 2019-09-29 ENCOUNTER — Other Ambulatory Visit: Payer: Self-pay | Admitting: Nurse Practitioner

## 2019-09-29 VITALS — BP 130/86 | HR 101 | Temp 97.3°F | Resp 20 | Ht 61.0 in | Wt 145.0 lb

## 2019-09-29 DIAGNOSIS — F411 Generalized anxiety disorder: Secondary | ICD-10-CM | POA: Diagnosis not present

## 2019-09-29 DIAGNOSIS — R7303 Prediabetes: Secondary | ICD-10-CM | POA: Diagnosis not present

## 2019-09-29 DIAGNOSIS — I10 Essential (primary) hypertension: Secondary | ICD-10-CM

## 2019-09-29 DIAGNOSIS — F331 Major depressive disorder, recurrent, moderate: Secondary | ICD-10-CM

## 2019-09-29 DIAGNOSIS — R7309 Other abnormal glucose: Secondary | ICD-10-CM

## 2019-09-29 LAB — BAYER DCA HB A1C WAIVED: HB A1C (BAYER DCA - WAIVED): 6.4 % (ref <7.0)

## 2019-09-29 NOTE — Assessment & Plan Note (Signed)
Patient is a 64 year old female who presents to clinic for elevated blood sugar, patient reports blood sugars have been elevated in the last few months.  Patient has a blood sugar log from home with lows value at 95 and high of 298.  Provided education on blood sugar management, diet and exercise, patient is set up with pharmacist tomorrow to make adjustments to current medication.  Provided education to patient on diet and exercise modification.  A1c completed results 6.4, which indicates patient is prediabetic. Labs pending for CMP, CBC. Follow-up with PCP in August.

## 2019-09-29 NOTE — Assessment & Plan Note (Signed)
Patient is a 64 year old female who is in clinic for essential hypertension patient was diagnosed in March 2010.  She is well managed and tolerating the medication well with no side effects.  She takes her medicine as directed she maintains a healthy diet and regular exercise and follows up as directed no changes to current plan.  Patient knows to follow-up as needed. Labs collected today CMP,CBC.

## 2019-09-29 NOTE — Patient Instructions (Addendum)
Essential hypertension Patient is a 64 year old female who is in clinic for essential hypertension patient was diagnosed in March 2010.  She is well managed and tolerating the medication well with no side effects.  She takes her medicine as directed she maintains a healthy diet and regular exercise and follows up as directed no changes to current plan.  Patient knows to follow-up as needed. Labs collected today CMP,CBC.  Pre-diabetes Patient is a 64 year old female who presents to clinic for elevated blood sugar, patient reports blood sugars have been elevated in the last few months.  Patient has a blood sugar log from home with lows value at 95 and high of 298.  Provided education on blood sugar management, diet and exercise, patient is set up with pharmacist tomorrow to make adjustments to current medication.  Provided education to patient on diet and exercise modification.  A1c completed results 6.4, which indicates patient is prediabetic. Labs pending for CMP, CBC. Follow-up with PCP in August.  GAD (generalized anxiety disorder) Concerning patient's generalized anxiety disorder, patient is well managed on current medication.  Patient denies any self harm to self and others.  Patient PHQ-9 and GAD-7 scores better than previous values.  Patient follows up as directed, and compliant with treatment.    Hyperglycemia Hyperglycemia occurs when the level of sugar (glucose) in the blood is too high. Glucose is a type of sugar that provides the body's main source of energy. Certain hormones (insulin and glucagon) control the level of glucose in the blood. Insulin lowers blood glucose, and glucagon increases blood glucose. Hyperglycemia can result from having too little insulin in the bloodstream, or from the body not responding normally to insulin. Hyperglycemia occurs most often in people who have diabetes (diabetes mellitus), but it can happen in people who do not have diabetes. It can  develop quickly, and it can be life-threatening if it causes you to become severely dehydrated (diabetic ketoacidosis or hyperglycemic hyperosmolar state). Severe hyperglycemia is a medical emergency. What are the causes? If you have diabetes, hyperglycemia may be caused by:  Diabetes medicine.  Medicines that increase blood glucose or affect your diabetes control.  Not eating enough, or not eating often enough.  Changes in physical activity level.  Being sick or having an infection. If you have prediabetes or undiagnosed diabetes:  Hyperglycemia may be caused by those conditions. If you do not have diabetes, hyperglycemia may be caused by:  Certain medicines, including steroid medicines, beta-blockers, epinephrine, and thiazide diuretics.  Stress.  Serious illness.  Surgery.  Diseases of the pancreas.  Infection. What increases the risk? Hyperglycemia is more likely to develop in people who have risk factors for diabetes, such as:  Having a family member with diabetes.  Having a gene for type 1 diabetes that is passed from parent to child (inherited).  Living in an area with cold weather conditions.  Exposure to certain viruses.  Certain conditions in which the body's disease-fighting (immune) system attacks itself (autoimmune disorders).  Being overweight or obese.  Having an inactive (sedentary) lifestyle.  Having been diagnosed with insulin resistance.  Having a history of prediabetes, gestational diabetes, or polycystic ovarian syndrome (PCOS).  Being of American-Indian, African-American, Hispanic/Latino, or Asian/Pacific Islander descent. What are the signs or symptoms? Hyperglycemia may not cause any symptoms. If you do have symptoms, they may include early warning signs, such as:  Increased thirst.  Hunger.  Feeling very tired.  Needing to urinate more often than usual.  Blurry vision. Other symptoms may develop if hyperglycemia gets worse, such  as:  Dry mouth.  Loss of appetite.  Fruity-smelling breath.  Weakness.  Unexpected or rapid weight gain or weight loss.  Tingling or numbness in the hands or feet.  Headache.  Skin that does not quickly return to normal after being lightly pinched and released (poor skin turgor).  Abdominal pain.  Cuts or bruises that are slow to heal. How is this diagnosed? Hyperglycemia is diagnosed with a blood test to measure your blood glucose level. This blood test is usually done while you are having symptoms. Your health care provider may also do a physical exam and review your medical history. You may have more tests to determine the cause of your hyperglycemia, such as:  A fasting blood glucose (FBG) test. You will not be allowed to eat (you will fast) for at least 8 hours before a blood sample is taken.  An A1c (hemoglobin A1c) blood test. This provides information about blood glucose control over the previous 2-3 months.  An oral glucose tolerance test (OGTT). This measures your blood glucose at two times: ? After fasting. This is your baseline blood glucose level. ? Two hours after drinking a beverage that contains glucose. How is this treated? Treatment depends on the cause of your hyperglycemia. Treatment may include:  Taking medicine to regulate your blood glucose levels. If you take insulin or other diabetes medicines, your medicine or dosage may be adjusted.  Lifestyle changes, such as exercising more, eating healthier foods, or losing weight.  Treating an illness or infection, if this caused your hyperglycemia.  Checking your blood glucose more often.  Stopping or reducing steroid medicines, if these caused your hyperglycemia. If your hyperglycemia becomes severe and it results in hyperglycemic hyperosmolar state, you must be hospitalized and given IV fluids. Follow these instructions at home:  General instructions  Take over-the-counter and prescription medicines  only as told by your health care provider.  Do not use any products that contain nicotine or tobacco, such as cigarettes and e-cigarettes. If you need help quitting, ask your health care provider.  Limit alcohol intake to no more than 1 drink per day for nonpregnant women and 2 drinks per day for men. One drink equals 12 oz of beer, 5 oz of wine, or 1 oz of hard liquor.  Learn to manage stress. If you need help with this, ask your health care provider.  Keep all follow-up visits as told by your health care provider. This is important. Eating and drinking   Maintain a healthy weight.  Exercise regularly, as directed by your health care provider.  Stay hydrated, especially when you exercise, get sick, or spend time in hot temperatures.  Eat healthy foods, such as: ? Lean proteins. ? Complex carbohydrates. ? Fresh fruits and vegetables. ? Low-fat dairy products. ? Healthy fats.  Drink enough fluid to keep your urine clear or pale yellow. If you have diabetes:  Make sure you know the symptoms of hyperglycemia.  Follow your diabetes management plan, as told by your health care provider. Make sure you: ? Take your insulin and medicines as directed. ? Follow your exercise plan. ? Follow your meal plan. Eat on time, and do not skip meals. ? Check your blood glucose as often as directed. Make sure to check your blood glucose before and after exercise. If you exercise longer or in a different way than usual, check your blood glucose more often. ? Follow your sick  day plan whenever you cannot eat or drink normally. Make this plan in advance with your health care provider.  Share your diabetes management plan with people in your workplace, school, and household.  Check your urine for ketones when you are ill and as told by your health care provider.  Carry a medical alert card or wear medical alert jewelry. Contact a health care provider if:  Your blood glucose is at or above 240  mg/dL (26.8 mmol/L) for 2 days in a row.  You have problems keeping your blood glucose in your target range.  You have frequent episodes of hyperglycemia. Get help right away if:  You have difficulty breathing.  You have a change in how you think, feel, or act (mental status).  You have nausea or vomiting that does not go away. These symptoms may represent a serious problem that is an emergency. Do not wait to see if the symptoms will go away. Get medical help right away. Call your local emergency services (911 in the [place] Do not drive yourself to the hospital. Summary  Hyperglycemia occurs when the level of sugar (glucose) in the blood is too high.  Hyperglycemia is diagnosed with a blood test to measure your blood glucose level. This blood test is usually done while you are having symptoms. Your health care provider may also do a physical exam and review your medical history.  If you have diabetes, follow your diabetes management plan as told by your health care provider.  Contact your health care provider if you have problems keeping your blood glucose in your target range. This information is not intended to replace advice given to you by your health care provider. Make sure you discuss any questions you have with your health care provider. Document Revised: 12/03/2015 Document Reviewed: 12/03/2015 Elsevier Patient Education  2020 [employer]. Hypertension, Adult Hypertension is another name for high blood pressure. High blood pressure forces your heart to work harder to pump blood. This can cause problems over time. There are two numbers in a blood pressure reading. There is a top number (systolic) over a bottom number (diastolic). It is best to have a blood pressure that is below 120/80. Healthy choices can help lower your blood pressure, or you may need medicine to help lower it. What are the causes? The cause of this condition is not known. Some conditions may be related to  high blood pressure. What increases the risk?  Smoking.  Having type 2 diabetes mellitus, high cholesterol, or both.  Not getting enough exercise or physical activity.  Being overweight.  Having too much fat, sugar, calories, or salt (sodium) in your diet.  Drinking too much alcohol.  Having long-term (chronic) kidney disease.  Having a family history of high blood pressure.  Age. Risk increases with age.  Race. You may be at higher risk if you are African American.  Gender. Men are at higher risk than women before age 38. After age 66, women are at higher risk than men.  Having obstructive sleep apnea.  Stress. What are the signs or symptoms?  High blood pressure may not cause symptoms. Very high blood pressure (hypertensive crisis) may cause: ? Headache. ? Feelings of worry or nervousness (anxiety). ? Shortness of breath. ? Nosebleed. ? A feeling of being sick to your stomach (nausea). ? Throwing up (vomiting). ? Changes in how you see. ? Very bad chest pain. ? Seizures. How is this treated?  This condition is treated by making healthy  lifestyle changes, such as: ? Eating healthy foods. ? Exercising more. ? Drinking less alcohol.  Your health care provider may prescribe medicine if lifestyle changes are not enough to get your blood pressure under control, and if: ? Your top number is above 130. ? Your bottom number is above 80.  Your personal target blood pressure may vary. Follow these instructions at home: Eating and drinking   If told, follow the DASH eating plan. To follow this plan: ? Fill one half of your plate at each meal with fruits and vegetables. ? Fill one fourth of your plate at each meal with whole grains. Whole grains include whole-wheat pasta, brown rice, and whole-grain bread. ? Eat or drink low-fat dairy products, such as skim milk or low-fat yogurt. ? Fill one fourth of your plate at each meal with low-fat (lean) proteins. Low-fat  proteins include fish, chicken without skin, eggs, beans, and tofu. ? Avoid fatty meat, cured and processed meat, or chicken with skin. ? Avoid pre-made or processed food.  Eat less than 1,500 mg of salt each day.  Do not drink alcohol if: ? Your doctor tells you not to drink. ? You are pregnant, may be pregnant, or are planning to become pregnant.  If you drink alcohol: ? Limit how much you use to:  0-1 drink a day for women.  0-2 drinks a day for men. ? Be aware of how much alcohol is in your drink. In the [place] one drink equals one 12 oz bottle of beer (355 mL), one 5 oz glass of wine (148 mL), or one 1 oz glass of hard liquor (44 mL). Lifestyle   Work with your doctor to stay at a healthy weight or to lose weight. Ask your doctor what the best weight is for you.  Get at least 30 minutes of exercise most days of the week. This may include walking, swimming, or biking.  Get at least 30 minutes of exercise that strengthens your muscles (resistance exercise) at least 3 days a week. This may include lifting weights or doing Pilates.  Do not use any products that contain nicotine or tobacco, such as cigarettes, e-cigarettes, and chewing tobacco. If you need help quitting, ask your doctor.  Check your blood pressure at home as told by your doctor.  Keep all follow-up visits as told by your doctor. This is important. Medicines  Take over-the-counter and prescription medicines only as told by your doctor. Follow directions carefully.  Do not skip doses of blood pressure medicine. The medicine does not work as well if you skip doses. Skipping doses also puts you at risk for problems.  Ask your doctor about side effects or reactions to medicines that you should watch for. Contact a doctor if you:  Think you are having a reaction to the medicine you are taking.  Have headaches that keep coming back (recurring).  Feel dizzy.  Have swelling in your ankles.  Have trouble with  your vision. Get help right away if you:  Get a very bad headache.  Start to feel mixed up (confused).  Feel weak or numb.  Feel faint.  Have very bad pain in your: ? Chest. ? Belly (abdomen).  Throw up more than once.  Have trouble breathing. Summary  Hypertension is another name for high blood pressure.  High blood pressure forces your heart to work harder to pump blood.  For most people, a normal blood pressure is less than 120/80.  Making healthy choices can  help lower blood pressure. If your blood pressure does not get lower with healthy choices, you may need to take medicine. This information is not intended to replace advice given to you by your health care provider. Make sure you discuss any questions you have with your health care provider. Document Revised: 11/25/2017 Document Reviewed: 11/25/2017 Elsevier Patient Education  2020 [employer].

## 2019-09-29 NOTE — Progress Notes (Signed)
Established Patient Office Visit  Subjective:  Patient ID: [name], female    DOB: [birth year]  Age: 64 y.o. MRN: [id number]  CC:  Chief Complaint  Patient presents with  . Medical Management of Chronic Issues    check blood sugars   . Hypertension    HPI [name] presents for elevated blood sugar.  Patient reports blood sugars have been elevated in the last few months.  Patient is in clinic with blood sugar logs.  Low blood sugar reading 95 and highest reading 298.  None of the blood fasting values are fasting.  Patient is reporting increase tremors, excessive sweating, especially at nighttime and few times during the day.  Per patient nothing has changed with her diet.  Patient is not experiencing headaches, dizziness, polyuria, or polydipsia.  Patient reports dry mouth which she states is not related to polydipsia and not new.  Pt presents for follow up of hypertension. Patient was diagnosed in eighth 06/17/2008. The patient is tolerating the medication well without side effects. Compliance with treatment has been good; including taking medication as directed , maintains a healthy diet and regular exercise regimen , and following up as directed.  Anxiety: Patient complains of anxiety.  First diagnosed in 2018.  She has the following symptoms: Hyperhidrosis probably related to changes in her blood sugars. Onset of symptoms was approximately few weeks to about a month ago, She denies current suicidal and homicidal ideation. Family history not significant for anxiety.  Previous treatment includes Lexapro 20 mg 1 tablet daily, Cymbalta 60 mg 1 tablet at bedtime.   She she is not complaining of any side effects from treatments.        Past Medical History:  Diagnosis Date  . Anginal pain ([place])    last time   . Anxiety   . Arthritis    RHEUMATOID  . Asthma   . Bipolar 1 disorder ([place])   . Cataracts, bilateral 07/2017  . COPD (chronic obstructive pulmonary disease)  ([place])   . Coronary artery disease    reported hx of "MI";  Echo 2009 with normal LVF;  Myoview 05/2011: no ischemia  . Depression   . Dyslipidemia   . Dysrhythmia    SVT  . Esophageal stricture   . Fibromyalgia   . GERD (gastroesophageal reflux disease)   . H/O hiatal hernia   . Head injury, unspecified   . Herpes simplex infection   . History of kidney stones   . Hyperlipidemia   . Hypertension   . Insomnia   . Myocardial infarction [facility])    age 78  . Osteoporosis   . Pneumonia    hx  . Seizures ([place])    last 3 weeks ago- prescribed keppra -never took didnt like side effects read about online  . Shortness of breath   . Sleep apnea    ? neg  . Spinal stenosis of lumbar region   . Spondylolisthesis   . Status post placement of implantable loop recorder   . Supraventricular tachycardia ([place])   . Syncope and collapse    s/p ILR; no arhythmogenic cause identified  . UTI (lower urinary tract infection)     Past Surgical History:  Procedure Laterality Date  . BACK SURGERY    . BREAST SURGERY     lumpectomy  . CATARACT EXTRACTION W/PHACO Right 07/31/2017   Procedure: CATARACT EXTRACTION PHACO AND INTRAOCULAR LENS PLACEMENT (IOC);  Surgeon: [name], MD;  Location: [facility];  Service: Ophthalmology;  Laterality: Right;  CDE: 2.33  . CATARACT EXTRACTION W/PHACO Left 08/14/2017   Procedure: CATARACT EXTRACTION PHACO AND INTRAOCULAR LENS PLACEMENT (IOC);  Surgeon: [name], MD;  Location: [facility];  Service: Ophthalmology;  Laterality: Left;  CDE: 2.74  . CHOLECYSTECTOMY    . CYSTOSCOPY     stone  . DOPPLER ECHOCARDIOGRAPHY  2009  . head up tilt table testing  06/15/2007   [name]  . HEMORRHOID SURGERY    . insertion of implatable loop recorder  08/11/2007   [name]  . POSTERIOR CERVICAL FUSION/FORAMINOTOMY N/A 12/19/2013   Procedure: RIGHT C3-4.C4-5 AND C5-6 FORAMINOTOMIES;  Surgeon: [name], MD;  Location: [facility];  Service: Orthopedics;  Laterality: N/A;    . TUBAL LIGATION      Family History  Problem Relation Age of Onset  . Heart attack Father   . Cancer Father   . Mental illness Father   . Cancer Mother   . Mental illness Mother   . Heart attack Brother        stents  . Alcohol abuse Brother   . Heart disease Brother   . Drug abuse Brother   . Diabetes Brother   . Colon cancer Maternal Aunt   . Cirrhosis Brother   . Stomach cancer Neg Hx     Social History   Socioeconomic History  . Marital status: Single    Spouse name: Not on file  . Number of children: 5  . Years of education: Not on file  . Highest education level: 5th grade  Occupational History  . Occupation: Disability    Comment: 15 years  Tobacco Use  . Smoking status: Never Smoker  . Smokeless tobacco: Never Used  Vaping Use  . Vaping Use: Never used  Substance and Sexual Activity  . Alcohol use: No    Alcohol/week: 0.0 standard drinks  . Drug use: No  . Sexual activity: Not Currently  Other Topics Concern  . Not on file  Social History Narrative   Patient lives with Daughter   Patient is on disability.    Patient has 5th grade education.    Patient has 5 children, 24 grand-chilren, and 5 great-grand children.    Right handed    Social Determinants of Health   Financial Resource Strain:   . Difficulty of Paying Living Expenses:   Food Insecurity:   . Worried About Charity fundraiser in the Last Year:   . Arboriculturist in the Last Year:   Transportation Needs:   . Film/video editor (Medical):   [name] Lack of Transportation (Non-Medical):   Physical Activity:   . Days of Exercise per Week:   . Minutes of Exercise per Session:   Stress:   . Feeling of Stress :   Social Connections:   . Frequency of Communication with Friends and Family:   . Frequency of Social Gatherings with Friends and Family:   . Attends Religious Services:   . Active Member of Clubs or Organizations:   . Attends Archivist Meetings:   [name] Marital  Status:   Intimate Partner Violence:   . Fear of Current or Ex-Partner:   . Emotionally Abused:   [name] Physically Abused:   . Sexually Abused:     Outpatient Medications Prior to Visit  Medication Sig Dispense Refill  . acyclovir ointment (ZOVIRAX) 5 % APPLY OINTMENT TO AFFECTED AREA EVERY 3 HOURS. (Patient taking differently: Apply 1 application  topically daily as needed. ) 30 g 0  . albuterol (PROVENTIL) (2.5 MG/3ML) 0.083% nebulizer solution USE 1 VIAL IN NEBULIZER EVERY 4 HOURS AS NEEDED FOR WHEEZING. (Patient taking differently: Take 2.5 mg by nebulization every 4 (four) hours as needed for wheezing. ) 180 mL 0  . albuterol (VENTOLIN HFA) 108 (90 Base) MCG/ACT inhaler INHALE 2 PUFFS EVERY 6 HOURS AS NEEDED FOR SHORTNESS OF BREATH AND WHEEZING. 8.5 g 0  . Alcohol Swabs (GLOBAL ALCOHOL PREP EASE) 70 % PADS USE 1 PAD DAILY WHEN CHECKING BLOOD SUGAR. R73.03 100 each 3  . busPIRone (BUSPAR) 10 MG tablet TAKE 1 TABLET BY MOUTH TWICE DAILY AS NEEDED. 60 tablet 0  . butalbital-acetaminophen-caffeine (FIORICET) 50-325-40 MG tablet TAKE 1 TABLET BY MOUTH EVERY 6 HOURS AS NEEDED FOR HEADACHE. 20 tablet 0  . carvedilol (COREG) 6.25 MG tablet TAKE 1 & 1/2 TABLETS BY MOUTH TWICE DAILY. 30 tablet 0  . celecoxib (CELEBREX) 200 MG capsule TAKE 1 CAPSULE DAILY FOR 7 DAYS, THEN TAKE 1 CAPSULE 2 TIMES A DAY FOR 21 DAYS. 49 capsule 0  . chlorzoxazone (PARAFON) 500 MG tablet TAKE (1) TABLET EVERY SIX HOURS AS NEEDED FOR MUSCLE SPASMS. 30 tablet 0  . dexlansoprazole (DEXILANT) 60 MG capsule Take 1 capsule (60 mg total) by mouth daily. 90 capsule 1  . diclofenac Sodium (VOLTAREN) 1 % GEL Apply 4 g topically 4 (four) times daily. 500 g 3  . DULoxetine (CYMBALTA) 60 MG capsule Take 1 capsule (60 mg total) by mouth at bedtime. 90 capsule 1  . escitalopram (LEXAPRO) 20 MG tablet TAKE 1 TABLET ONCE DAILY. 30 tablet 2  . estradiol (ESTRACE) 0.1 MG/GM vaginal cream PLACE 1 APPLICATORFUL VAGINALLY AT BEDTIME. 42.5 g 0  .  fludrocortisone (FLORINEF) 0.1 MG tablet TAKE 1 TABLET EVERY OTHER DAY. 45 tablet 1  . fluticasone furoate-vilanterol (BREO ELLIPTA) 200-25 MCG/INH AEPB Inhale 1 puff into the lungs daily. 60 each 5  . furosemide (LASIX) 40 MG tablet TAKE 1 TABLET ONCE DAILY AS NEEDED FOR FLUID. 90 tablet 0  . [START ON 10/07/2019] HYDROcodone-acetaminophen (NORCO) 7.5-325 MG tablet Take 1 tablet by mouth in the morning and at bedtime. 60 tablet 0  . ipratropium (ATROVENT) 0.02 % nebulizer solution USE 1 VIAL IN NEBULIZER EVERY 4 HOURS AS NEEDED FOR WHEEZING. 150 mL 0  . isosorbide mononitrate (IMDUR) 30 MG 24 hr tablet Take 1 tablet (30 mg total) by mouth daily. 90 tablet 1  . lamoTRIgine (LAMICTAL) 150 MG tablet Take 1 tablet (150 mg total) by mouth daily. 90 tablet 1  . Lancets (ONETOUCH DELICA PLUS [id number]) MISC USE TO CHECK SUGAR DAILY. 50 each 5  . levETIRAcetam (KEPPRA) 500 MG tablet TAKE (1) TABLET TWICE DAILY. 180 tablet 1  . levocetirizine (XYZAL) 5 MG tablet TAKE 1 TABLET BY MOUTH EVERY MORNING. (Patient taking differently: Take 5 mg by mouth daily as needed for allergies. ) 30 tablet 11  . lidocaine (XYLOCAINE) 5 % ointment APPLY AFFECTED AREA THREE TIMES DAILY AS NEEDED FOR MILD OR MODERATE PAIN. (Patient taking differently: Apply 1 application topically 3 (three) times daily as needed for mild pain or moderate pain. ) 50 g 0  . ondansetron (ZOFRAN) 4 MG tablet TAKE 1 TABLET BY MOUTH EVERY 8 HOURS AS NEEDED FOR NAUSEA AND VOMITING. 20 tablet 0  . oxybutynin (DITROPAN) 5 MG tablet Take 1 tablet (5 mg total) by mouth at bedtime. 90 tablet 1  . rosuvastatin (CRESTOR) 10 MG tablet Take 1 tablet (  10 mg total) by mouth daily. 90 tablet 1  . sucralfate (CARAFATE) 1 g tablet TAKE 1 TABLET 4 TIMES A DAY WITH MEALS AND AT BEDTIME. 90 tablet 2  . traZODone (DESYREL) 150 MG tablet Take 1 tablet (150 mg total) by mouth at bedtime. 90 tablet 1  . valACYclovir (VALTREX) 500 MG tablet Take 1 tablet (500 mg total) by  mouth 2 (two) times daily. 180 tablet 1  . fluticasone (FLONASE) 50 MCG/ACT nasal spray SPRAY 2 SPRAYS IN EACH NOSTRIL ONCE DAILY. (Patient not taking: Reported on 09/29/2019) 16 g 0  . HYDROcodone-acetaminophen (NORCO) 7.5-325 MG tablet Take 1 tablet by mouth in the morning and at bedtime. (Patient not taking: Reported on 09/29/2019) 60 tablet 0  . nitroGLYCERIN (NITROSTAT) 0.4 MG SL tablet PLACE ONE (1) TABLET UNDER TONGUE EVERY 5 MINUTES UP TO (3) DOSES AS NEEDED FOR CHEST PAIN. (Patient not taking: Reported on 09/29/2019) 25 tablet 0  . azithromycin (ZITHROMAX) 250 MG tablet Take 2 the first day and then one each day after. 6 tablet 0  . guaiFENesin-codeine 100-10 MG/5ML syrup Take 10 mLs by mouth every 6 (six) hours as needed for cough. 180 mL 0  . guaiFENesin-dextromethorphan (ROBITUSSIN DM) 100-10 MG/5ML syrup Take 5 mLs by mouth every 4 (four) hours as needed for cough. 118 mL 0  . methylPREDNISolone (MEDROL DOSEPAK) 4 MG TBPK tablet Use as directed. 21 each 0  . metroNIDAZOLE (FLAGYL) 500 MG tablet Take 1 tablet (500 mg total) by mouth 2 (two) times daily. 14 tablet 0  . predniSONE (DELTASONE) 20 MG tablet Take 3 tabs daily for 1 week, then 2 tabs daily for week 2, then 1 tab daily for week 3. 42 tablet 0   No facility-administered medications prior to visit.    Allergies  Allergen Reactions  . Codeine Other (See Comments)    "I will have a heart attack."  . Morphine And Related Other (See Comments)    "It will cause me to have a heart attack."  . Ambien [Zolpidem Tartrate] Nausea And Vomiting  . Lyrica [Pregabalin] Swelling and Other (See Comments)    Weight gain  . Neurontin [Gabapentin] Other (See Comments)    Causes elevated LFTs       Office Visit from 09/29/2019 in [facility]  PHQ-9 Total Score 15     GAD 7 : Generalized Anxiety Score 08/08/2019 06/30/2017 12/15/2016 11/13/2015  Nervous, Anxious, on Edge 3 0 0 1  Control/stop worrying 3 0 0 3  Worry too  much - different things 3 0 0 3  Trouble relaxing 3 0 0 3  Restless 3 0 0 3  Easily annoyed or irritable 0 0 0 3  Afraid - awful might happen 0 0 0 0  Total GAD 7 Score 15 0 0 16  Anxiety Difficulty Not difficult at all - Not difficult at all Somewhat difficult    ROS Review of Systems  Constitutional: Negative.   HENT: Negative.   Eyes: Negative.   Respiratory: Negative.   Cardiovascular: Negative.   Gastrointestinal: Negative.   Endocrine:       Hyperhidrosis  Genitourinary: Negative.   Skin: Negative for rash.  Neurological: Negative.   Psychiatric/Behavioral: Negative for sleep disturbance and suicidal ideas. The patient is nervous/anxious.       Objective:    Physical Exam Constitutional:      Appearance: Normal appearance.  HENT:     Head: Normocephalic.  Cardiovascular:     Rate  and Rhythm: Normal rate and regular rhythm.     Pulses: Normal pulses.     Heart sounds: Normal heart sounds.  Pulmonary:     Effort: Pulmonary effort is normal.     Breath sounds: Normal breath sounds.  Abdominal:     General: Bowel sounds are normal.  Musculoskeletal:        General: Normal range of motion.     Cervical back: Neck supple.  Skin:    Findings: No erythema or rash.     Comments: Excessive seating (hyperhidrosis)  Neurological:     Mental Status: She is alert and oriented to person, place, and time.  Psychiatric:     Comments: anxious     BP 130/86   Pulse (!) 101   Temp (!) 97.3 F (36.3 C)   Resp 20   Ht 5' 1"  (1.549 m)   Wt 145 lb (65.8 kg)   SpO2 98%   BMI 27.40 kg/m  Wt Readings from Last 3 Encounters:  09/29/19 145 lb (65.8 kg)  08/15/19 150 lb (68 kg)  08/08/19 149 lb (67.6 kg)     Health Maintenance Due  Topic Date Due  . COVID-19 Vaccine (1) Never done  . PAP SMEAR-Modifier  10/15/2019    There are no preventive care reminders to display for this patient.  Lab Results  Component Value Date   TSH 2.560 06/15/2018   Lab Results    Component Value Date   WBC 8.4 06/09/2019   HGB 11.9 06/09/2019   HCT 39.9 06/09/2019   MCV 81 06/09/2019   PLT 284 06/09/2019   Lab Results  Component Value Date   NA 143 06/09/2019   K 4.8 06/09/2019   CO2 25 06/09/2019   GLUCOSE 122 (H) 06/09/2019   BUN 11 06/09/2019   CREATININE 0.72 06/09/2019   BILITOT <0.2 06/09/2019   ALKPHOS 97 06/09/2019   AST 18 06/09/2019   ALT 9 06/09/2019   PROT 6.9 06/09/2019   ALBUMIN 4.4 06/09/2019   CALCIUM 10.1 06/09/2019   ANIONGAP 14 04/17/2019   GFR 67.10 11/18/2012   Lab Results  Component Value Date   CHOL 207 (H) 06/09/2019   Lab Results  Component Value Date   HDL 48 06/09/2019   Lab Results  Component Value Date   LDLCALC 116 (H) 06/09/2019   Lab Results  Component Value Date   TRIG 249 (H) 06/09/2019   Lab Results  Component Value Date   CHOLHDL 4.3 06/09/2019   Lab Results  Component Value Date   HGBA1C 6.0 06/09/2019     Office Visit from 09/29/2019 in [facility]  PHQ-9 Total Score 15     GAD 7 : Generalized Anxiety Score 09/29/2019 08/08/2019 06/30/2017 12/15/2016  Nervous, Anxious, on Edge 3 3 0 0  Control/stop worrying 1 3 0 0  Worry too much - different things 1 3 0 0  Trouble relaxing 3 3 0 0  Restless 0 3 0 0  Easily annoyed or irritable 0 0 0 0  Afraid - awful might happen 1 0 0 0  Total GAD 7 Score 9 15 0 0  Anxiety Difficulty Somewhat difficult Not difficult at all - Not difficult at all       Assessment & Plan:  Essential hypertension Patient is a 64 year old female who is in clinic for essential hypertension patient was diagnosed in March 2010.  She is well managed and tolerating the medication well with no side effects.  She takes her medicine as directed she maintains a healthy diet and regular exercise and follows up as directed no changes to current plan.  Patient knows to follow-up as needed. Labs collected today CMP,CBC.  Pre-diabetes Patient is a 64 year old  female who presents to clinic for elevated blood sugar, patient reports blood sugars have been elevated in the last few months.  Patient has a blood sugar log from home with lows value at 95 and high of 298.  Provided education on blood sugar management, diet and exercise, patient is set up with pharmacist tomorrow to make adjustments to current medication.  Provided education to patient on diet and exercise modification.  A1c completed results 6.4, which indicates patient is prediabetic. Labs pending for CMP, CBC. Follow-up with PCP in August.  GAD (generalized anxiety disorder) Concerning patient's generalized anxiety disorder, patient is well managed on current medication.  Patient denies any self harm to self and others.  Patient PHQ-9 and GAD-7 scores better than previous values.  Patient follows up as directed, and compliant with treatment.  Problem List Items Addressed This Visit      Cardiovascular and Mediastinum   Essential hypertension - Primary   Relevant Orders   CBC with Differential/Platelet   CMP14+EGFR     Other   Pre-diabetes   Relevant Orders   CBC with Differential/Platelet   Bayer DCA Hb A1c Waived   Abnormal blood sugar   Relevant Orders   CBC with Differential/Platelet   Bayer DCA Hb A1c Waived      No orders of the defined types were placed in this encounter.   Follow-up: No follow-ups on file.    Ivy Lynn, NP

## 2019-09-29 NOTE — Assessment & Plan Note (Signed)
Concerning patient's generalized anxiety disorder, patient is well managed on current medication.  Patient denies any self harm to self and others.  Patient PHQ-9 and GAD-7 scores better than previous values.  Patient follows up as directed, and compliant with treatment.

## 2019-09-30 ENCOUNTER — Ambulatory Visit (INDEPENDENT_AMBULATORY_CARE_PROVIDER_SITE_OTHER): Payer: 59 | Admitting: Pharmacist

## 2019-09-30 DIAGNOSIS — R7303 Prediabetes: Secondary | ICD-10-CM | POA: Diagnosis not present

## 2019-09-30 LAB — CBC WITH DIFFERENTIAL/PLATELET
Basophils Absolute: 0.1 10*3/uL (ref 0.0–0.2)
Basos: 1 %
EOS (ABSOLUTE): 0.2 10*3/uL (ref 0.0–0.4)
Eos: 2 %
Hematocrit: 44.4 % (ref 34.0–46.6)
Hemoglobin: 13.6 g/dL (ref 11.1–15.9)
Immature Grans (Abs): 0.1 10*3/uL (ref 0.0–0.1)
Immature Granulocytes: 1 %
Lymphocytes Absolute: 2.9 10*3/uL (ref 0.7–3.1)
Lymphs: 28 %
MCH: 24.1 pg — ABNORMAL LOW (ref 26.6–33.0)
MCHC: 30.6 g/dL — ABNORMAL LOW (ref 31.5–35.7)
MCV: 79 fL (ref 79–97)
Monocytes Absolute: 0.7 10*3/uL (ref 0.1–0.9)
Monocytes: 7 %
Neutrophils Absolute: 6.2 10*3/uL (ref 1.4–7.0)
Neutrophils: 61 %
Platelets: 342 10*3/uL (ref 150–450)
RBC: 5.65 x10E6/uL — ABNORMAL HIGH (ref 3.77–5.28)
RDW: 18.5 % — ABNORMAL HIGH (ref 11.7–15.4)
WBC: 10.1 10*3/uL (ref 3.4–10.8)

## 2019-09-30 LAB — CMP14+EGFR
ALT: 18 IU/L (ref 0–32)
AST: 24 IU/L (ref 0–40)
Albumin/Globulin Ratio: 1.5 (ref 1.2–2.2)
Albumin: 4.8 g/dL (ref 3.8–4.8)
Alkaline Phosphatase: 147 IU/L — ABNORMAL HIGH (ref 48–121)
BUN/Creatinine Ratio: 20 (ref 12–28)
BUN: 19 mg/dL (ref 8–27)
Bilirubin Total: 0.3 mg/dL (ref 0.0–1.2)
CO2: 20 mmol/L (ref 20–29)
Calcium: 10.5 mg/dL — ABNORMAL HIGH (ref 8.7–10.3)
Chloride: 101 mmol/L (ref 96–106)
Creatinine, Ser: 0.93 mg/dL (ref 0.57–1.00)
GFR calc Af Amer: 76 mL/min/{1.73_m2} (ref >59)
GFR calc non Af Amer: 66 mL/min/{1.73_m2} (ref >59)
Globulin, Total: 3.1 g/dL (ref 1.5–4.5)
Glucose: 180 mg/dL — ABNORMAL HIGH (ref 65–99)
Potassium: 4.4 mmol/L (ref 3.5–5.2)
Sodium: 144 mmol/L (ref 134–144)
Total Protein: 7.9 g/dL (ref 6.0–8.5)

## 2019-09-30 MED ORDER — METFORMIN HCL ER 500 MG PO TB24: 500.0000 mg | ORAL_TABLET | Freq: Every day | ORAL | 3 refills | Status: DC

## 2019-09-30 NOTE — Progress Notes (Signed)
    09/30/2019 Name: [name] MRN: [id number] DOB: Aug 24, 1955   S:  68 yoF presents for diabetes evaluation, education, and management Patient was referred and last seen by Primary Care Provider on 09/29/19.  Insurance coverage/medication affordability: [employer] medicare  Patient reports adherence with medications. . Current diabetes medications include: n/a . Current hypertension medications include: carvedilol, isosorbide Goal 130/80 . Current hyperlipidemia medications include: atorvastatin   Patient denies hypoglycemic events.   Patient reported dietary habits: Eats 2-3 meals/day Discussed meal planning options and Plate method for healthy eating . Avoid sugary drinks and desserts . Incorporate balanced protein, non starchy veggies, 1 serving of carbohydrate with each meal . Increase water intake . Increase physical activity as able  Patient-reported exercise habits: n/a   O:  Lab Results  Component Value Date   HGBA1C 6.4 09/29/2019    Lipid Panel     Component Value Date/Time   CHOL 207 (H) 06/09/2019 1526   TRIG 249 (H) 06/09/2019 1526   TRIG 233 (H) 07/24/2014 1040   HDL 48 06/09/2019 1526   HDL 47 07/24/2014 1040   CHOLHDL 4.3 06/09/2019 1526   CHOLHDL 4.2 CALC 04/22/2007 1012   VLDL 20 04/22/2007 1012   LDLCALC 116 (H) 06/09/2019 1526   LDLDIRECT 142.1 04/22/2007 1012     Home fasting blood sugars: n/a  2 hour post-meal/random blood sugars: n/a.   A/P:  Diabetes T2DM currently controlled, however patient looking for additional protection (renal/cardiac)--A1c trending up. Patient is adherent with medication.  -Start metformin XR 500mg  with breakfast  Discussed side effects and instructions  -Consider SGLT2 for cardiac/renal benefits (insurance requires trial and failure or metformin 1st line)  -Patient has started fiber and probiotics (continue)  -Continue taking all medications as prescribed  -Extensively discussed pathophysiology of  diabetes, recommended lifestyle interventions, dietary effects on blood sugar control  -Counseled on s/sx of and management of hypoglycemia  -Next A1C anticipated 6-12 months    Written patient instructions provided.  Total time in face to face counseling 60 minutes.   Follow up PCP Clinic Visit in 10/2019.   11/2019, PharmD, BCPS Clinical Pharmacist, [facility]  II Phone [phone]

## 2019-10-04 ENCOUNTER — Other Ambulatory Visit: Payer: Self-pay

## 2019-10-04 ENCOUNTER — Telehealth: Payer: Self-pay | Admitting: Nurse Practitioner

## 2019-10-04 ENCOUNTER — Ambulatory Visit (INDEPENDENT_AMBULATORY_CARE_PROVIDER_SITE_OTHER): Payer: 59 | Admitting: Cardiology

## 2019-10-04 ENCOUNTER — Encounter: Payer: Self-pay | Admitting: Cardiology

## 2019-10-04 VITALS — BP 118/82 | HR 90 | Ht 62.0 in | Wt 152.4 lb

## 2019-10-04 DIAGNOSIS — G909 Disorder of the autonomic nervous system, unspecified: Secondary | ICD-10-CM | POA: Diagnosis not present

## 2019-10-04 DIAGNOSIS — R0789 Other chest pain: Secondary | ICD-10-CM

## 2019-10-04 DIAGNOSIS — I10 Essential (primary) hypertension: Secondary | ICD-10-CM

## 2019-10-04 MED ORDER — CARVEDILOL 6.25 MG PO TABS: ORAL_TABLET | ORAL | 1 refills | Status: DC

## 2019-10-04 MED ORDER — FUROSEMIDE 40 MG PO TABS: ORAL_TABLET | ORAL | 1 refills | Status: DC

## 2019-10-04 MED ORDER — ISOSORBIDE MONONITRATE ER 30 MG PO TB24: 30.0000 mg | ORAL_TABLET | Freq: Every day | ORAL | 1 refills | Status: DC

## 2019-10-04 NOTE — Patient Instructions (Signed)

## 2019-10-04 NOTE — Telephone Encounter (Signed)
Last dose metformin 10/03/19  Nausea/vomiting/cramping from metformin STOP metformin START Jardiance 10mg  daily with breakfast in 3-5 days when side effects from metformin subside  Samples #14 tabs of Jardiance 10mg  EXP 2/22   Encouraged patient to stay hydrated Counseled Sick day rules: if sick, vomiting, have diarrhea, or  cannot drink enough fluids, you should stop taking SGLT-2 inhibitors until your symptoms go away. In rare cases, these medicines can cause diabetic ketoacidosis (DKA). DKA is acid buildup in the blood.

## 2019-10-04 NOTE — Progress Notes (Signed)
Clinical Summary Ms. Mcclain is a 64 y.o.female seen today for follow up of the following medical problems.   1. Recurrent syncope/Autonomic dysfunction - followed by Dr Ladona Ridgel, according to notes prior loop recorder showed no clear arrhythmias - thought to be neurally mediated syncope secondary to vasodepression according to notes, has been started on florinef daily and midodrine prn - referred to autonomic specialist at [facility]. Issues getting arranged with transporation.    - we lowered florinef to 0.1mg  daily due to very high bp's. She is on coreg9.375mg  bid scheduled  - last visit we changed florinef to 0.1mg  every other day due to high bp's - since change has done well, no significant dizziness or syncope  2. HTN - pcp stopped valium earlier this month. Since then she reports elevating bp's - 180s/120s 180/124 201/140 197/127. HRs 57-100s - on coreg 9.375mg  bid, on florinef 0.1mg  every other day  -home bp's have done well per her report    3. Chest pain - several year history of chest pain - normal cath in 2013, normal stress test 2013 - admit to [facility] 07/2014 with chest pain. Negative workup for ACS. She was discharged with an out patient Lexiscan which showed no evidence of ischemia.  - echo 11/2014 LVEF 55-60%, no WMAs.  -09/2017 nuclear stress no ischemia. - started on imdur by pcpwith improved symptoms.   12/2018 admission with chest pain - no objective evidence of ischemia by EKG or enzymes this admission - echoLVE 60-65%, grade I diastolic dysfunction - symptoms werenoncardiac in description. Lasting constantly x 6 days, worst with deep breathing.  - no recent symptoms.    4. Preoperative evaluation - being considered for shoulder surgery.   Past Medical History:  Diagnosis Date  . Anginal pain (HCC)    last time   . Anxiety   . Arthritis    RHEUMATOID  . Asthma   . Bipolar 1 disorder (HCC)   . Cataracts, bilateral  07/2017  . COPD (chronic obstructive pulmonary disease) (HCC)   . Coronary artery disease    reported hx of "MI";  Echo 2009 with normal LVF;  Myoview 05/2011: no ischemia  . Depression   . Dyslipidemia   . Dysrhythmia    SVT  . Esophageal stricture   . Fibromyalgia   . GERD (gastroesophageal reflux disease)   . H/O hiatal hernia   . Head injury, unspecified   . Herpes simplex infection   . History of kidney stones   . Hyperlipidemia   . Hypertension   . Insomnia   . Myocardial infarction [facility])    age 72  . Osteoporosis   . Pneumonia    hx  . Seizures (HCC)    last 3 weeks ago- prescribed keppra -never took didnt like side effects read about online  . Shortness of breath   . Sleep apnea    ? neg  . Spinal stenosis of lumbar region   . Spondylolisthesis   . Status post placement of implantable loop recorder   . Supraventricular tachycardia (HCC)   . Syncope and collapse    s/p ILR; no arhythmogenic cause identified  . UTI (lower urinary tract infection)      Allergies  Allergen Reactions  . Codeine Other (See Comments)    "I will have a heart attack."  . Morphine And Related Other (See Comments)    "It will cause me to have a heart attack."  . Ambien [Zolpidem Tartrate] Nausea And  Vomiting  . Lyrica [Pregabalin] Swelling and Other (See Comments)    Weight gain  . Neurontin [Gabapentin] Other (See Comments)    Causes elevated LFTs      Current Outpatient Medications  Medication Sig Dispense Refill  . acyclovir ointment (ZOVIRAX) 5 % APPLY OINTMENT TO AFFECTED AREA EVERY 3 HOURS. (Patient taking differently: Apply 1 application topically daily as needed. ) 30 g 0  . albuterol (PROVENTIL) (2.5 MG/3ML) 0.083% nebulizer solution USE 1 VIAL IN NEBULIZER EVERY 4 HOURS AS NEEDED FOR WHEEZING. (Patient taking differently: Take 2.5 mg by nebulization every 4 (four) hours as needed for wheezing. ) 180 mL 0  . albuterol (VENTOLIN HFA) 108 (90 Base) MCG/ACT inhaler INHALE 2  PUFFS EVERY 6 HOURS AS NEEDED FOR SHORTNESS OF BREATH AND WHEEZING. 8.5 g 0  . Alcohol Swabs (GLOBAL ALCOHOL PREP EASE) 70 % PADS USE 1 PAD DAILY WHEN CHECKING BLOOD SUGAR. R73.03 100 each 3  . busPIRone (BUSPAR) 10 MG tablet TAKE 1 TABLET BY MOUTH TWICE DAILY AS NEEDED. 60 tablet 0  . butalbital-acetaminophen-caffeine (FIORICET) 50-325-40 MG tablet TAKE 1 TABLET BY MOUTH EVERY 6 HOURS AS NEEDED FOR HEADACHE. 20 tablet 0  . carvedilol (COREG) 6.25 MG tablet TAKE 1 & 1/2 TABLETS BY MOUTH TWICE DAILY. 7 tablet 0  . celecoxib (CELEBREX) 200 MG capsule TAKE 1 CAPSULE DAILY FOR 7 DAYS, THEN TAKE 1 CAPSULE 2 TIMES A DAY FOR 21 DAYS. 49 capsule 0  . chlorzoxazone (PARAFON) 500 MG tablet TAKE (1) TABLET EVERY SIX HOURS AS NEEDED FOR MUSCLE SPASMS. 30 tablet 0  . dexlansoprazole (DEXILANT) 60 MG capsule Take 1 capsule (60 mg total) by mouth daily. 90 capsule 1  . diclofenac Sodium (VOLTAREN) 1 % GEL Apply 4 g topically 4 (four) times daily. 500 g 3  . DULoxetine (CYMBALTA) 60 MG capsule Take 1 capsule (60 mg total) by mouth at bedtime. 90 capsule 1  . escitalopram (LEXAPRO) 20 MG tablet TAKE 1 TABLET ONCE DAILY. 30 tablet 0  . estradiol (ESTRACE) 0.1 MG/GM vaginal cream PLACE 1 APPLICATORFUL VAGINALLY AT BEDTIME. 42.5 g 0  . fludrocortisone (FLORINEF) 0.1 MG tablet TAKE 1 TABLET EVERY OTHER DAY. 7 tablet 0  . fluticasone (FLONASE) 50 MCG/ACT nasal spray SPRAY 2 SPRAYS IN EACH NOSTRIL ONCE DAILY. (Patient not taking: Reported on 09/29/2019) 16 g 0  . fluticasone furoate-vilanterol (BREO ELLIPTA) 200-25 MCG/INH AEPB Inhale 1 puff into the lungs daily. 60 each 5  . furosemide (LASIX) 40 MG tablet TAKE 1 TABLET ONCE DAILY AS NEEDED FOR FLUID. 90 tablet 0  . [START ON 10/07/2019] HYDROcodone-acetaminophen (NORCO) 7.5-325 MG tablet Take 1 tablet by mouth in the morning and at bedtime. 60 tablet 0  . HYDROcodone-acetaminophen (NORCO) 7.5-325 MG tablet Take 1 tablet by mouth in the morning and at bedtime. (Patient not  taking: Reported on 09/29/2019) 60 tablet 0  . ipratropium (ATROVENT) 0.02 % nebulizer solution USE 1 VIAL IN NEBULIZER EVERY 4 HOURS AS NEEDED FOR WHEEZING. 150 mL 0  . isosorbide mononitrate (IMDUR) 30 MG 24 hr tablet Take 1 tablet (30 mg total) by mouth daily. 90 tablet 1  . lamoTRIgine (LAMICTAL) 150 MG tablet Take 1 tablet (150 mg total) by mouth daily. 90 tablet 1  . Lancets (ONETOUCH DELICA PLUS LANCET33G) MISC USE TO CHECK SUGAR DAILY. 50 each 5  . levETIRAcetam (KEPPRA) 500 MG tablet TAKE (1) TABLET TWICE DAILY. 180 tablet 1  . levocetirizine (XYZAL) 5 MG tablet TAKE 1 TABLET BY MOUTH EVERY  MORNING. (Patient taking differently: Take 5 mg by mouth daily as needed for allergies. ) 30 tablet 11  . lidocaine (XYLOCAINE) 5 % ointment APPLY AFFECTED AREA THREE TIMES DAILY AS NEEDED FOR MILD OR MODERATE PAIN. (Patient taking differently: Apply 1 application topically 3 (three) times daily as needed for mild pain or moderate pain. ) 50 g 0  . metFORMIN (GLUCOPHAGE XR) 500 MG 24 hr tablet Take 1 tablet (500 mg total) by mouth daily with breakfast. 30 tablet 3  . nitroGLYCERIN (NITROSTAT) 0.4 MG SL tablet PLACE ONE (1) TABLET UNDER TONGUE EVERY 5 MINUTES UP TO (3) DOSES AS NEEDED FOR CHEST PAIN. (Patient not taking: Reported on 09/29/2019) 25 tablet 0  . ondansetron (ZOFRAN) 4 MG tablet TAKE 1 TABLET BY MOUTH EVERY 8 HOURS AS NEEDED FOR NAUSEA AND VOMITING. 20 tablet 0  . oxybutynin (DITROPAN) 5 MG tablet Take 1 tablet (5 mg total) by mouth at bedtime. 90 tablet 1  . rosuvastatin (CRESTOR) 10 MG tablet Take 1 tablet (10 mg total) by mouth daily. 90 tablet 1  . sucralfate (CARAFATE) 1 g tablet TAKE 1 TABLET 4 TIMES A DAY WITH MEALS AND AT BEDTIME. 90 tablet 2  . traZODone (DESYREL) 150 MG tablet Take 1 tablet (150 mg total) by mouth at bedtime. 90 tablet 1  . valACYclovir (VALTREX) 500 MG tablet Take 1 tablet (500 mg total) by mouth 2 (two) times daily. 180 tablet 1   No current facility-administered  medications for this visit.     Past Surgical History:  Procedure Laterality Date  . BACK SURGERY    . BREAST SURGERY     lumpectomy  . CATARACT EXTRACTION W/PHACO Right 07/31/2017   Procedure: CATARACT EXTRACTION PHACO AND INTRAOCULAR LENS PLACEMENT (IOC);  Surgeon: Fabio Pierce, MD;  Location: [facility];  Service: Ophthalmology;  Laterality: Right;  CDE: 2.33  . CATARACT EXTRACTION W/PHACO Left 08/14/2017   Procedure: CATARACT EXTRACTION PHACO AND INTRAOCULAR LENS PLACEMENT (IOC);  Surgeon: Fabio Pierce, MD;  Location: [facility];  Service: Ophthalmology;  Laterality: Left;  CDE: 2.74  . CHOLECYSTECTOMY    . CYSTOSCOPY     stone  . DOPPLER ECHOCARDIOGRAPHY  2009  . head up tilt table testing  06/15/2007   [name]  . HEMORRHOID SURGERY    . insertion of implatable loop recorder  08/11/2007   [name]  . POSTERIOR CERVICAL FUSION/FORAMINOTOMY N/A 12/19/2013   Procedure: RIGHT C3-4.C4-5 AND C5-6 FORAMINOTOMIES;  Surgeon: Kerrin Champagne, MD;  Location: [facility] OR;  Service: Orthopedics;  Laterality: N/A;  . TUBAL LIGATION       Allergies  Allergen Reactions  . Codeine Other (See Comments)    "I will have a heart attack."  . Morphine And Related Other (See Comments)    "It will cause me to have a heart attack."  . Ambien [Zolpidem Tartrate] Nausea And Vomiting  . Lyrica [Pregabalin] Swelling and Other (See Comments)    Weight gain  . Neurontin [Gabapentin] Other (See Comments)    Causes elevated LFTs       Family History  Problem Relation Age of Onset  . Heart attack Father   . Cancer Father   . Mental illness Father   . Cancer Mother   . Mental illness Mother   . Heart attack Brother        stents  . Alcohol abuse Brother   . Heart disease Brother   . Drug abuse Brother   . Diabetes Brother   .  Colon cancer Maternal Aunt   . Cirrhosis Brother   . Stomach cancer Neg Hx      Social History Ms. Medwid reports that she has never smoked. She has never used smokeless  tobacco. Ms. Hemric reports no history of alcohol use.   Review of Systems CONSTITUTIONAL: No weight loss, fever, chills, weakness or fatigue.  HEENT: Eyes: No visual loss, blurred vision, double vision or yellow sclerae.No hearing loss, sneezing, congestion, runny nose or sore throat.  SKIN: No rash or itching.  CARDIOVASCULAR: per hpi RESPIRATORY: No shortness of breath, cough or sputum.  GASTROINTESTINAL: No anorexia, nausea, vomiting or diarrhea. No abdominal pain or blood.  GENITOURINARY: No burning on urination, no polyuria NEUROLOGICAL: No headache, dizziness, syncope, paralysis, ataxia, numbness or tingling in the extremities. No change in bowel or bladder control.  MUSCULOSKELETAL: No muscle, back pain, joint pain or stiffness.  LYMPHATICS: No enlarged nodes. No history of splenectomy.  PSYCHIATRIC: No history of depression or anxiety.  ENDOCRINOLOGIC: No reports of sweating, cold or heat intolerance. No polyuria or polydipsia.  [name]   Physical Examination Today's Vitals   10/04/19 1457  BP: 118/82  Pulse: 90  SpO2: 94%  Weight: 152 lb 6.4 oz (69.1 kg)  Height: 5\' 2"  (1.575 m)   Body mass index is 27.87 kg/m.  Gen: resting comfortably, no acute distress HEENT: no scleral icterus, pupils equal round and reactive, no palptable cervical adenopathy,  CV: RRR, no mr/g, no jvd Resp: Clear to auscultation bilaterally GI: abdomen is soft, non-tender, non-distended, normal bowel sounds, no hepatosplenomegaly MSK: extremities are warm, no edema.  Skin: warm, no rash Neuro:  no focal deficits Psych: appropriate affect   Diagnostic Studies  05/2011 Lexiscan MPI No ischemia   Jan 2009 Echo LEFT VENTRICLE: - Left ventricular size was normal. - Overall left ventricular systolic function was normal. - There were no left ventricular regional wall motion abnormalities. - Left ventricular wall thickness was normal.  AORTIC VALVE: - The aortic valve was trileaflet. -  Aortic valve thickness was normal.  AORTA: - The aortic root was normal in size. - The aortic arch was normal.  MITRAL VALVE: - Mitral valve structure was normal.  Doppler interpretation(s): - There was trivial mitral valvular regurgitation.  LEFT ATRIUM: - Left atrial size was normal.  PULMONARY VEINS: - The pulmonary veins were grossly normal.  RIGHT VENTRICLE: - Right ventricular size was normal. - Right ventricular systolic function was normal. - Right ventricular wall thickness was normal.  PULMONIC VALVE: - The structure of the pulmonic valve appeared to be normal.  TRICUSPID VALVE: - The tricuspid valve structure was normal.  Doppler interpretation(s): - There was no significant tricuspid valvular regurgitation.  PULMONARY ARTERY: - The pulmonary artery was normal size.  RIGHT ATRIUM: - Right atrial size was normal.  SYSTEMIC VEINS: - The inferior vena cava was normal.  PERICARDIUM: - There was no pericardial effusion.  ---------------------------------------------------------------  SUMMARY - Overall left ventricular systolic function was normal. There were no left ventricular regional wall motion abnormalities. - The pulmonary veins were grossly normal.   09/2011 Cath Hemodynamic Findings: Central aortic pressure: 108/58  Left ventricular pressure: 107/10/14  Angiographic Findings:  Left main: No obstructive disease noted.  Left Anterior Descending Artery: Moderate to large sized vessel that courses to the apex. There is a moderate sized diagonal Betzaira Mentel. No obstructive disease noted.  Circumflex Artery: Large, dominant artery with moderate sized first obtuse marginal Hazyl Marseille and left sided posterolateral Tyianna Menefee with no disease  noted.  Right Coronary Artery: Small, non-dominant vessel with no disease noted.  Left Ventricular Angiogram:LVEF=65%.  Impression:  1. No angiographic evidence of CAD  2. Normal LV systolic function  3.  Non-cardiac chest pain  Recommendations: No further ischemic workup.  Complications: None. The patient tolerated the procedure well.   11/2014 echo Study Conclusions  - Left ventricle: The cavity size was normal. Systolic function was normal. The estimated ejection fraction was in the range of 55% to 60%. Wall motion was normal; there were no regional wall motion abnormalities. There was an increased relative contribution of atrial contraction to ventricular filling. Doppler parameters are consistent with abnormal left ventricular relaxation (grade 1 diastolic dysfunction). - Mitral valve: There was trivial regurgitation. - Tricuspid valve: There was trivial regurgitation. - Pulmonic valve: There was trivial regurgitation.   09/2017 nuclear stress  There was no ST segment deviation noted during stress. Nonspecific T wave flattening in aVL and V2 seen throughout study.  Defect 1: There is a medium defect of mild severity present in the mid inferior, apical inferior and apical lateral location. This appears to be due to soft tissue attenuation given normal regional wall motion. No ischemic territories.  This is a low risk study.  Nuclear stress EF: 65%.   Assessment and Plan   1. Syncope/Autonomic dysfunction -difficult balance between severe symptomatic both hypotension and HTN. Currently on florinef 0.1mg  every other day and coreg9.375mg  bid. - doing well symptoms wise, continue current regimen    2. Chest pain - long history of symptoms with multiple negative stress tests, most recently7/2019Lexiscan was negative. Cath 2013 with patent vessels. Symptoms did improve with imdur - no recent symptoms, continue to monitor  3. Preoperative evaluation - multiple negative cardiac evaluations in the past, last stress test 2019 without ischemia - recommend proceeding with ortho surgery if indicated   Antoine Poche, M.D

## 2019-10-10 ENCOUNTER — Other Ambulatory Visit: Payer: Self-pay | Admitting: Nurse Practitioner

## 2019-10-10 ENCOUNTER — Telehealth: Payer: Self-pay | Admitting: Nurse Practitioner

## 2019-10-10 DIAGNOSIS — K2100 Gastro-esophageal reflux disease with esophagitis, without bleeding: Secondary | ICD-10-CM

## 2019-10-10 DIAGNOSIS — G44229 Chronic tension-type headache, not intractable: Secondary | ICD-10-CM

## 2019-10-10 DIAGNOSIS — F331 Major depressive disorder, recurrent, moderate: Secondary | ICD-10-CM

## 2019-10-10 MED ORDER — EMPAGLIFLOZIN 25 MG PO TABS: 25.0000 mg | ORAL_TABLET | Freq: Every day | ORAL | 3 refills | Status: DC

## 2019-10-10 NOTE — Telephone Encounter (Signed)
Patient aware and verbalizes understanding. 

## 2019-10-10 NOTE — Telephone Encounter (Signed)
Please let patient know the following"   Jardiance RX called in to [facility] pharmacy  She will take 25mg  daily  Patient needs to stay hydrated!  Please let me know if not covered by your insurance  Thank you!!

## 2019-10-11 ENCOUNTER — Ambulatory Visit
Admission: RE | Admit: 2019-10-11 | Discharge: 2019-10-11 | Disposition: A | Discharge status: Home / Self Care | Payer: 59 | Source: Ambulatory Visit | Attending: Orthopedic Surgery | Admitting: Orthopedic Surgery

## 2019-10-11 ENCOUNTER — Other Ambulatory Visit: Payer: Self-pay

## 2019-10-11 DIAGNOSIS — M19011 Primary osteoarthritis, right shoulder: Secondary | ICD-10-CM

## 2019-10-13 ENCOUNTER — Other Ambulatory Visit: Payer: Self-pay

## 2019-10-13 ENCOUNTER — Ambulatory Visit (INDEPENDENT_AMBULATORY_CARE_PROVIDER_SITE_OTHER): Payer: 59 | Admitting: Orthopedic Surgery

## 2019-10-13 DIAGNOSIS — M19011 Primary osteoarthritis, right shoulder: Secondary | ICD-10-CM | POA: Diagnosis not present

## 2019-10-15 ENCOUNTER — Encounter: Payer: Self-pay | Admitting: Orthopedic Surgery

## 2019-10-15 NOTE — Progress Notes (Signed)
Office Visit Note   Patient: [name]           Date of Birth: 12-Jun-1955           MRN: [id number] Visit Date: 10/13/2019 Requested by: Bennie Pierini, FNP [address] [place],  [place] [address] PCP: Bennie Pierini, FNP  Subjective: Chief Complaint  Patient presents with  . Right Shoulder - Follow-up    HPI: [name] is a 64 y.o. female who presents to the office complaining of right shoulder pain. She presents for right shoulder CT scan review. CT scan was obtained for preoperative planning purposes. Plan at last office visit was to proceed with right shoulder replacement. Patient notes continued right shoulder pain that is debilitating. She denies any dyspnea, coughing, shortness of breath, fevers, chills. She does not take a blood thinner. She does have a cardiologist but she states that she has been cleared by her cardiology group. She does report that she has needed blood transfusions with the last 2 prior surgeries that she has had..                ROS: All systems reviewed are negative as they relate to the chief complaint within the history of present illness.  Patient denies fevers or chills.  Assessment & Plan: Visit Diagnoses:  1. Primary osteoarthritis, right shoulder     Plan: Patient is a 64 year old female who presents for review of CT scan that was obtained prior to surgery for preoperative planning purposes. CT scan was reviewed with patient today. Questions pertaining to surgery were answered to patient satisfaction. Plan to proceed with right shoulder arthroplasty. Patient will be scheduled for surgery and follow-up after procedure. She has been cleared by her cardiologist. Possibility that she will require blood transfusion during the procedure based on her history. She is not taking a blood thinner.  The risk and benefits of the procedure discussed with the patient including not limited to infection nerve vessel damage dislocation  potential need for revision surgery as well as incomplete functional restoration and pain relief.  Patient understands the risk and benefits.  All questions answered.  Follow-Up Instructions: No follow-ups on file.   Orders:  No orders of the defined types were placed in this encounter.  No orders of the defined types were placed in this encounter.     Procedures: No procedures performed   Clinical Data: No additional findings.  Objective: Vital Signs: There were no vitals taken for this visit.  Physical Exam:  Constitutional: Patient appears well-developed HEENT:  Head: Normocephalic Eyes:EOM are normal Neck: Normal range of motion Cardiovascular: Normal rate Pulmonary/chest: Effort normal Neurologic: Patient is alert Skin: Skin is warm Psychiatric: Patient has normal mood and affect  Ortho Exam: Orthopedic exam demonstrates right shoulder with 45 degrees external rotation, 85 degrees abduction, 110 degrees forward flexion. Excellent strength of the supraspinatus, infraspinatus, subscapularis muscles. Deltoid muscles firing. Crepitus felt with passive range of motion of the right shoulder.  Specialty Comments:  No specialty comments available.  Imaging: No results found.   PMFS History: Patient Active Problem List   Diagnosis Date Noted  . Abnormal blood sugar 09/29/2019  . Overweight (BMI 25.0-29.9) 04/15/2019  . Sigmoid diverticulitis 04/14/2019  . Chronic diastolic CHF (congestive heart failure) (HCC) 04/14/2019  . Chest pain 01/17/2019  . Unstable angina (HCC) 10/20/2017  . Chronic pain 09/23/2016  . Pre-diabetes 09/02/2016  . Recurrent major depressive disorder, in partial remission (HCC) 07/14/2016  . Seizures (  HCC) 07/14/2016  . GAD (generalized anxiety disorder) 07/14/2016  . Insomnia 09/05/2014  . Allergic rhinitis 09/05/2014  . Cervical spondylosis without myelopathy 12/19/2013    Class: Chronic  . Neural foraminal stenosis of cervical spine  12/19/2013  . Cervical radiculitis 09/19/2013  . Lumbosacral spondylosis without myelopathy 11/16/2012  . Postlaminectomy syndrome, lumbar region 11/16/2012  . Herpes simplex virus (HSV) infection 10/31/2008  . Hyperlipidemia with target LDL less than 100 10/31/2008  . Essential hypertension 10/31/2008  . GERD 10/31/2008  . SPINAL STENOSIS OF LUMBAR REGION 10/31/2008  . Myalgia 10/31/2008  . Osteoporosis 10/31/2008  . SPONDYLOLISTHESIS 10/31/2008  . SYNCOPE 10/31/2008  . Sleep apnea 10/31/2008   Past Medical History:  Diagnosis Date  . Anginal pain (HCC)    last time   . Anxiety   . Arthritis    RHEUMATOID  . Asthma   . Bipolar 1 disorder (HCC)   . Cataracts, bilateral 07/2017  . COPD (chronic obstructive pulmonary disease) (HCC)   . Coronary artery disease    reported hx of "MI";  Echo 2009 with normal LVF;  Myoview 05/2011: no ischemia  . Depression   . Dyslipidemia   . Dysrhythmia    SVT  . Esophageal stricture   . Fibromyalgia   . GERD (gastroesophageal reflux disease)   . H/O hiatal hernia   . Head injury, unspecified   . Herpes simplex infection   . History of kidney stones   . Hyperlipidemia   . Hypertension   . Insomnia   . Myocardial infarction [facility])    age 6  . Osteoporosis   . Pneumonia    hx  . Seizures (HCC)    last 3 weeks ago- prescribed keppra -never took didnt like side effects read about online  . Shortness of breath   . Sleep apnea    ? neg  . Spinal stenosis of lumbar region   . Spondylolisthesis   . Status post placement of implantable loop recorder   . Supraventricular tachycardia (HCC)   . Syncope and collapse    s/p ILR; no arhythmogenic cause identified  . UTI (lower urinary tract infection)     Family History  Problem Relation Age of Onset  . Heart attack Father   . Cancer Father   . Mental illness Father   . Cancer Mother   . Mental illness Mother   . Heart attack Brother        stents  . Alcohol abuse Brother   . Heart  disease Brother   . Drug abuse Brother   . Diabetes Brother   . Colon cancer Maternal Aunt   . Cirrhosis Brother   . Stomach cancer Neg Hx     Past Surgical History:  Procedure Laterality Date  . BACK SURGERY    . BREAST SURGERY     lumpectomy  . CATARACT EXTRACTION W/PHACO Right 07/31/2017   Procedure: CATARACT EXTRACTION PHACO AND INTRAOCULAR LENS PLACEMENT (IOC);  Surgeon: Fabio Pierce, MD;  Location: [facility];  Service: Ophthalmology;  Laterality: Right;  CDE: 2.33  . CATARACT EXTRACTION W/PHACO Left 08/14/2017   Procedure: CATARACT EXTRACTION PHACO AND INTRAOCULAR LENS PLACEMENT (IOC);  Surgeon: Fabio Pierce, MD;  Location: [facility];  Service: Ophthalmology;  Laterality: Left;  CDE: 2.74  . CHOLECYSTECTOMY    . CYSTOSCOPY     stone  . DOPPLER ECHOCARDIOGRAPHY  2009  . head up tilt table testing  06/15/2007   [name]  . HEMORRHOID SURGERY    .  insertion of implatable loop recorder  08/11/2007   [name]  . POSTERIOR CERVICAL FUSION/FORAMINOTOMY N/A 12/19/2013   Procedure: RIGHT C3-4.C4-5 AND C5-6 FORAMINOTOMIES;  Surgeon: Kerrin Champagne, MD;  Location: [facility] OR;  Service: Orthopedics;  Laterality: N/A;  . TUBAL LIGATION     Social History   Occupational History  . Occupation: Disability    Comment: 15 years  Tobacco Use  . Smoking status: Never Smoker  . Smokeless tobacco: Never Used  Vaping Use  . Vaping Use: Never used  Substance and Sexual Activity  . Alcohol use: No    Alcohol/week: 0.0 standard drinks  . Drug use: No  . Sexual activity: Not Currently

## 2019-11-01 ENCOUNTER — Other Ambulatory Visit: Payer: Self-pay | Admitting: Cardiology

## 2019-11-01 ENCOUNTER — Other Ambulatory Visit: Payer: Self-pay | Admitting: Nurse Practitioner

## 2019-11-01 ENCOUNTER — Other Ambulatory Visit: Payer: Self-pay | Admitting: Specialist

## 2019-11-01 DIAGNOSIS — G44229 Chronic tension-type headache, not intractable: Secondary | ICD-10-CM

## 2019-11-02 ENCOUNTER — Other Ambulatory Visit: Payer: Self-pay | Admitting: Specialist

## 2019-11-02 ENCOUNTER — Other Ambulatory Visit: Payer: Self-pay | Admitting: Nurse Practitioner

## 2019-11-02 DIAGNOSIS — F411 Generalized anxiety disorder: Secondary | ICD-10-CM

## 2019-11-02 DIAGNOSIS — F331 Major depressive disorder, recurrent, moderate: Secondary | ICD-10-CM

## 2019-11-07 ENCOUNTER — Other Ambulatory Visit: Payer: Self-pay

## 2019-11-08 ENCOUNTER — Encounter (HOSPITAL_COMMUNITY): Payer: Self-pay

## 2019-11-08 ENCOUNTER — Other Ambulatory Visit: Payer: Self-pay

## 2019-11-08 ENCOUNTER — Encounter (HOSPITAL_COMMUNITY)
Admission: RE | Admit: 2019-11-08 | Discharge: 2019-11-08 | Disposition: A | Discharge status: Home / Self Care | Payer: 59 | Source: Ambulatory Visit | Attending: Orthopedic Surgery | Admitting: Orthopedic Surgery

## 2019-11-08 DIAGNOSIS — R569 Unspecified convulsions: Secondary | ICD-10-CM | POA: Diagnosis not present

## 2019-11-08 DIAGNOSIS — R079 Chest pain, unspecified: Secondary | ICD-10-CM | POA: Diagnosis not present

## 2019-11-08 DIAGNOSIS — R42 Dizziness and giddiness: Secondary | ICD-10-CM | POA: Diagnosis not present

## 2019-11-08 DIAGNOSIS — I10 Essential (primary) hypertension: Secondary | ICD-10-CM | POA: Insufficient documentation

## 2019-11-08 DIAGNOSIS — Z79899 Other long term (current) drug therapy: Secondary | ICD-10-CM | POA: Insufficient documentation

## 2019-11-08 DIAGNOSIS — Z01812 Encounter for preprocedural laboratory examination: Secondary | ICD-10-CM | POA: Diagnosis present

## 2019-11-08 HISTORY — DX: Anemia, unspecified: D64.9

## 2019-11-08 HISTORY — DX: Other specified postprocedural states: Z98.890

## 2019-11-08 HISTORY — DX: Type 2 diabetes mellitus without complications: E11.9

## 2019-11-08 HISTORY — DX: Heart failure, unspecified: I50.9

## 2019-11-08 HISTORY — DX: Headache, unspecified: R51.9

## 2019-11-08 LAB — URINALYSIS, ROUTINE W REFLEX MICROSCOPIC
Bacteria, UA: NONE SEEN
Bilirubin Urine: NEGATIVE
Glucose, UA: >=500 mg/dL — AB
Hgb urine dipstick: NEGATIVE
Ketones, ur: NEGATIVE mg/dL
Nitrite: NEGATIVE
Protein, ur: NEGATIVE mg/dL
Specific Gravity, Urine: 1.038 — ABNORMAL HIGH (ref 1.005–1.030)
pH: 5 (ref 5.0–8.0)

## 2019-11-08 LAB — SURGICAL PCR SCREEN
MRSA, PCR: NEGATIVE
Staphylococcus aureus: NEGATIVE

## 2019-11-08 LAB — BASIC METABOLIC PANEL
Anion gap: 10 (ref 5–15)
BUN: 12 mg/dL (ref 8–23)
CO2: 27 mmol/L (ref 22–32)
Calcium: 9.3 mg/dL (ref 8.9–10.3)
Chloride: 104 mmol/L (ref 98–111)
Creatinine, Ser: 0.78 mg/dL (ref 0.44–1.00)
GFR calc Af Amer: >60 mL/min (ref >60)
GFR calc non Af Amer: >60 mL/min (ref >60)
Glucose, Bld: 115 mg/dL — ABNORMAL HIGH (ref 70–99)
Potassium: 3.6 mmol/L (ref 3.5–5.1)
Sodium: 141 mmol/L (ref 135–145)

## 2019-11-08 LAB — CBC
HCT: 36.8 % (ref 36.0–46.0)
Hemoglobin: 10.6 g/dL — ABNORMAL LOW (ref 12.0–15.0)
MCH: 23.8 pg — ABNORMAL LOW (ref 26.0–34.0)
MCHC: 28.8 g/dL — ABNORMAL LOW (ref 30.0–36.0)
MCV: 82.7 fL (ref 80.0–100.0)
Platelets: 231 10*3/uL (ref 150–400)
RBC: 4.45 MIL/uL (ref 3.87–5.11)
RDW: 17.4 % — ABNORMAL HIGH (ref 11.5–15.5)
WBC: 7.7 10*3/uL (ref 4.0–10.5)
nRBC: 0 % (ref 0.0–0.2)

## 2019-11-08 LAB — HEMOGLOBIN A1C
Hgb A1c MFr Bld: 6.8 % — ABNORMAL HIGH (ref 4.8–5.6)
Mean Plasma Glucose: 148.46 mg/dL

## 2019-11-08 NOTE — Pre-Procedure Instructions (Signed)
Your procedure is scheduled on Tuesday, August 17 from 11:05 AM- 3:24 PM.  Report to [facility] Main Entrance "A" at 09:05 A.M., and check in at the Admitting office.  Call this number if you have problems the morning of surgery:  [phone].    Remember:  Do not eat after midnight the night before your surgery.  You may drink clear liquids until 08:05 AM the morning of your surgery.   Clear liquids allowed are: Water, Non-Citrus Juices (without pulp), Carbonated Beverages, Clear Tea, Black Coffee Only, and Gatorade.   Enhanced Recovery after Surgery for Orthopedics Enhanced Recovery after Surgery is a protocol used to improve the stress on your body and your recovery after surgery.   . The day of surgery (if you have diabetes): o  o Drink ONE (1) Gatorade 2 (G2) by 08:05 AM the morning of surgery o This drink was given to you during your hospital  pre-op appointment visit.  o Color of the Gatorade may vary. Red is not allowed. o Nothing else to drink after completing the  Gatorade 2 (G2).         If you have questions, please contact your surgeon's office.      Take these medicines the morning of surgery with A SIP OF WATER: busPIRone (BUSPAR) carvedilol (COREG) dexlansoprazole (DEXILANT)  escitalopram (LEXAPRO) fludrocortisone (FLORINEF) isosorbide mononitrate (IMDUR)  lamoTRIgine (LAMICTAL)  levETIRAcetam (KEPPRA) rosuvastatin (CRESTOR) valACYclovir (VALTREX) fluticasone furoate-vilanterol (BREO ELLIPTA) inhaler  IF NEEDED: chlorzoxazone (PARAFON)  levocetirizine (XYZAL) nitroGLYCERIN (NITROSTAT)  ondansetron (ZOFRAN) albuterol (PROVENTIL) nebulizer ipratropium (ATROVENT) nebulizer albuterol (VENTOLIN HFA) inhaler- bring with you the day of surgery    As of today, STOP taking any Aspirin (unless otherwise instructed by your surgeon), celecoxib (CELEBREX), diclofenac Sodium (VOLTAREN) GEL, Aleve, Naproxen, Ibuprofen, Motrin, Advil, Goody's, BC's, all herbal  medications, fish oil, and all vitamins.   WHAT DO I DO ABOUT MY DIABETES MEDICATION?  [name] Do not take empagliflozin (JARDIANCE) the day before or the morning of surgery.   HOW TO MANAGE YOUR DIABETES BEFORE AND AFTER SURGERY  Why is it important to control my blood sugar before and after surgery? . Improving blood sugar levels before and after surgery helps healing and can limit problems. . A way of improving blood sugar control is eating a healthy diet by: o  Eating less sugar and carbohydrates o  Increasing activity/exercise o  Talking with your doctor about reaching your blood sugar goals . High blood sugars (greater than 180 mg/dL) can raise your risk of infections and slow your recovery, so you will need to focus on controlling your diabetes during the weeks before surgery. . Make sure that the doctor who takes care of your diabetes knows about your planned surgery including the date and location.  How do I manage my blood sugar before surgery? . Check your blood sugar at least 4 times a day, starting 2 days before surgery, to make sure that the level is not too high or low. . Check your blood sugar the morning of your surgery when you wake up and every 2 hours until you get to the Short Stay unit. o If your blood sugar is less than 70 mg/dL, you will need to treat for low blood sugar: - Do not take insulin. - Treat a low blood sugar (less than 70 mg/dL) with  cup of clear juice (cranberry or apple), 4 glucose tablets, OR glucose gel. - Recheck blood sugar in 15 minutes after treatment (to make  sure it is greater than 70 mg/dL). If your blood sugar is not greater than 70 mg/dL on recheck, call [phone] for further instructions. . Report your blood sugar to the short stay nurse when you get to Short Stay.  . If you are admitted to the hospital after surgery: o Your blood sugar will be checked by the staff and you will probably be given insulin after surgery (instead of oral  diabetes medicines) to make sure you have good blood sugar levels. o The goal for blood sugar control after surgery is 80-180 mg/dL.        The Morning of Surgery:               Do not wear jewelry, make up, or nail polish.            Do not wear lotions, powders, perfumes, or deodorant.            Do not shave 48 hours prior to surgery.              Do not bring valuables to the hospital.            [facility] is not responsible for any belongings or valuables.  Do NOT Smoke (Tobacco/Vaping) or drink Alcohol 24 hours prior to your procedure.  If you use a CPAP at night, you may bring all equipment for your overnight stay.   Contacts, glasses, dentures or bridgework may not be worn into surgery.      For patients admitted to the hospital, discharge time will be determined by your treatment team.   Patients discharged the day of surgery will not be allowed to drive home, and someone needs to stay with them for 24 hours.                                     [place]- Preparing for Total Shoulder Arthroplasty   Before surgery, you can play an important role. Because skin is not sterile, your skin needs to be as free of germs as possible. You can reduce the number of germs on your skin by using the following products. . Benzoyl Peroxide Gel o Reduces the number of germs present on the skin o Applied twice a day to shoulder area starting two days before surgery   . Chlorhexidine Gluconate (CHG) Soap o An antiseptic cleaner that kills germs and bonds with the skin to continue killing germs even after washing o Used for showering the night before surgery and morning of surgery   Oral Hygiene is also important to reduce your risk of infection.                                    Remember - BRUSH YOUR TEETH THE MORNING OF SURGERY WITH YOUR REGULAR TOOTHPASTE  ==================================================================  Please follow these instructions carefully:  BENZOYL  PEROXIDE 5% GEL  Please do not use if you have an allergy to benzoyl peroxide.   If your skin becomes reddened/irritated stop using the benzoyl peroxide.  Starting two days before surgery, apply as follows: 1. Apply benzoyl peroxide in the morning and at night. Apply after taking a shower. If you are not taking a shower clean entire shoulder front, back, and side along with the armpit with a clean wet washcloth.  2. Place a quarter-sized  dollop on your shoulder and rub in thoroughly, making sure to cover the front, back, and side of your shoulder, along with the armpit.   2 days before ____ AM   ____ PM              1 day before ____ AM   ____ PM                            3.  Do this twice a day for two days.  (Last application is the night before surgery, AFTER using the CHG soap as described below).  4. Do NOT apply benzoyl peroxide gel on the day of surgery.  CHLORHEXIDINE GLUCONATE (CHG) SOAP  Please do not use if you have an allergy to CHG or antibacterial soaps. If your skin becomes reddened/irritated stop using the CHG.   Do not shave (including legs and underarms) for at least 48 hours prior to first CHG shower. It is OK to shave your face.  Starting the night before surgery, use CHG soap as follows:  1. Shower the NIGHT BEFORE SURGERY and MORNING OF SURGERY with CHG.  2. If you choose to wash your hair, wash your hair first as usual with your normal shampoo.  3. After shampooing, rinse your hair and body thoroughly to remove the shampoo.  4. Use CHG as you would any other liquid soap.  You can apply CHG directly to the skin and wash gently with a scrungie or a clean washcloth.  5. Apply the CHG soap to your body ONLY FROM THE NECK DOWN.  Do not use on open wounds or open sores.  Avoid contact with your eyes, ears, mouth, and genitals (private parts).  Wash face and genitals (private parts) with your normal soap.  6. Wash thoroughly, paying special attention to the area  where your surgery will be performed.  7. Thoroughly rinse your body with warm water from the neck down.  8. DO NOT shower/wash with your normal soap after using and rinsing off the CHG soap.   9. Pat yourself dry with a CLEAN TOWEL.   10.  Apply benzoyl peroxide.   11. Wear CLEAN PAJAMAS to bed the night before surgery; wear comfortable clothes the morning of surgery.  12. Place CLEAN SHEETS on your bed the night of your first shower and DO NOT SLEEP WITH PETS.  Day of Surgery: Shower as above Do not apply any deodorants/lotions.  Please wear clean clothes to the hospital/surgery center.   Remember to brush your teeth WITH YOUR REGULAR TOOTHPASTE.

## 2019-11-08 NOTE — Progress Notes (Addendum)
PCP - Bennie Pierini, FNP Cardiologist - Dina Rich, MD; Sharrell Ku, MD   PPM/ICD - Denies Pt has loop recorder managed by Dr. Ladona Ridgel.  Chest x-ray - N/A EKG - 04/14/19 Stress Test - 12/20/14 ECHO - 01/18/19 Cardiac Cath - 10/06/11  Sleep Study - Yes, positive for OSA, non-compliant with CPAP.  Fasting Blood Sugar: 70s- high 200s  Checks Blood Sugar 1 time a day. CBG at PAT appointment was 144. A1C obtained.  Blood Thinner Instructions: N/A Aspirin Instructions: N/A  ERAS Protcol - Yes PRE-SURGERY Ensure or G2- G2 given  COVID TEST- 11/11/19   Anesthesia review: Yes, cardiac hx.  Patient denies shortness of breath, fever, cough and chest pain at PAT appointment   All instructions explained to the patient, with a verbal understanding of the material. Patient agrees to go over the instructions while at home for a better understanding. Patient also instructed to self quarantine after being tested for COVID-19. The opportunity to ask questions was provided.

## 2019-11-09 ENCOUNTER — Encounter (HOSPITAL_COMMUNITY): Payer: Self-pay

## 2019-11-09 LAB — URINE CULTURE: Culture: 70000 — AB

## 2019-11-09 NOTE — Anesthesia Preprocedure Evaluation (Addendum)
Anesthesia Evaluation  Patient identified by MRN, date of birth, ID band Patient awake    Reviewed: Allergy & Precautions, NPO status , Patient's Chart, lab work & pertinent test results  Airway Mallampati: II  TM Distance: >3 FB     Dental  (+) Dental Advisory Given   Pulmonary asthma , sleep apnea ,    breath sounds clear to auscultation       Cardiovascular hypertension, Pt. on medications + dysrhythmias  Rhythm:Regular Rate:Normal     Neuro/Psych Seizures -,  PSYCHIATRIC DISORDERS    GI/Hepatic Neg liver ROS, hiatal hernia, GERD  ,  Endo/Other  diabetes  Renal/GU negative Renal ROS     Musculoskeletal  (+) Arthritis , Fibromyalgia -  Abdominal   Peds  Hematology  (+) anemia ,   Anesthesia Other Findings   Reproductive/Obstetrics                            Anesthesia Physical Anesthesia Plan  ASA: III  Anesthesia Plan: General   Post-op Pain Management:  Regional for Post-op pain   Induction: Intravenous  PONV Risk Score and Plan: 3 and Dexamethasone, Ondansetron, Midazolam and Treatment may vary due to age or medical condition  Airway Management Planned: Oral ETT  Additional Equipment:   Intra-op Plan:   Post-operative Plan: Extubation in OR  Informed Consent: I have reviewed the patients History and Physical, chart, labs and discussed the procedure including the risks, benefits and alternatives for the proposed anesthesia with the patient or authorized representative who has indicated his/her understanding and acceptance.     Dental advisory given  Plan Discussed with: CRNA  Anesthesia Plan Comments: (PAT note by Antionette Poles, PA-C: Follows with cardiology for history of difficult to manage hypertension with concurrent syncope/autonomic dysfunction (currently improved on Florinef 0.1 mg every other day and Coreg 9.375 mg twice daily), longstanding recurrent chest pain  (long history of symptoms with multiple negative stress tests, most recently7/2019Lexiscan was negative. Cath 2013 with patent vessels. Symptoms did improve with imdur). Last seen by Dr. Wyline Mood 10/04/2019 and preop clearance addressed.  Per note, "- multiple negative cardiac evaluations in the past, last stress test 2019 without ischemia - recommend proceeding with ortho surgery if indicated."  Patient had prior loop recorder that showed no clear arrhythmias.  History of seizure like activity followed by neurology, maintained on Keppra.  EEG was normal, unclear if some of her reported symptoms are psychogenic.  DM2, A1c 6.8 on 11/08/2019.  Labs reviewed, mild anemia with hemoglobin 10.6.  Otherwise unremarkable.  EKG 04/14/2019: Sinus rhythm.  Rate 90.  LVH.  TTE 01/18/2019: 1. Left ventricular ejection fraction, by visual estimation, is 60 to  65%. The left ventricle has normal function. There is mildly increased  left ventricular hypertrophy.  2. Left ventricular diastolic Doppler parameters are consistent with  impaired relaxation pattern of LV diastolic filling.  3. Global right ventricle has normal systolic function.The right  ventricular size is normal. No increase in right ventricular wall  thickness.  4. Left atrial size was normal.  5. Right atrial size was normal.  6. The mitral valve is normal in structure. No evidence of mitral valve  regurgitation. No evidence of mitral stenosis.  7. The tricuspid valve is normal in structure. Tricuspid valve  regurgitation was not visualized by color flow Doppler.  8. The aortic valve is tricuspid Aortic valve regurgitation is mild to  moderate by color flow Doppler. Structurally  normal aortic valve, with no  evidence of sclerosis or stenosis.  9. The pulmonic valve was not well visualized. Pulmonic valve  regurgitation is not visualized by color flow Doppler.  10. Normal pulmonary artery systolic pressure.   09/2017 nuclear  stress: There was no ST segment deviation noted during stress. Nonspecific T wave flattening in aVL and V2 seen throughout study. Defect 1: There is a medium defect of mild severity present in the mid inferior, apical inferior and apical lateral location. This appears to be due to soft tissue attenuation given normal regional wall motion. No ischemic territories. This is a low risk study. Nuclear stress EF: 65%.  09/2011 Cath: Hemodynamic Findings: Central aortic pressure: 108/58  Left ventricular pressure: 107/10/14  Angiographic Findings:  Left main: No obstructive disease noted.  Left Anterior Descending Artery: Moderate to large sized vessel that courses to the apex. There is a moderate sized diagonal branch. No obstructive disease noted.  Circumflex Artery: Large, dominant artery with moderate sized first obtuse marginal branch and left sided posterolateral branch with no disease noted.  Right Coronary Artery: Small, non-dominant vessel with no disease noted.  Left Ventricular Angiogram:LVEF=65%.  Impression:  1. No angiographic evidence of CAD  2. Normal LV systolic function  3. Non-cardiac chest pain  Recommendations: No further ischemic workup.  Complications: None. The patient tolerated the procedure well.  )       Anesthesia Quick Evaluation

## 2019-11-09 NOTE — Progress Notes (Signed)
Anesthesia Chart Review:  Follows with cardiology for history of difficult to manage hypertension with concurrent syncope/autonomic dysfunction (currently improved on Florinef 0.1 mg every other day and Coreg 9.375 mg twice daily), longstanding recurrent chest pain (long history of symptoms with multiple negative stress tests, most recently7/2019Lexiscan was negative. Cath 2013 with patent vessels. Symptoms did improve with imdur). Last seen by Dr. Wyline Mood 10/04/2019 and preop clearance addressed.  Per note, "- multiple negative cardiac evaluations in the past, last stress test 2019 without ischemia - recommend proceeding with ortho surgery if indicated."  Patient had prior loop recorder that showed no clear arrhythmias.  History of seizure like activity followed by neurology, maintained on Keppra.  EEG was normal, unclear if some of her reported symptoms are psychogenic.  DM2, A1c 6.8 on 11/08/2019.  Labs reviewed, mild anemia with hemoglobin 10.6.  Otherwise unremarkable.  EKG 04/14/2019: Sinus rhythm.  Rate 90.  LVH.  TTE 01/18/2019: 1. Left ventricular ejection fraction, by visual estimation, is 60 to  65%. The left ventricle has normal function. There is mildly increased  left ventricular hypertrophy.  2. Left ventricular diastolic Doppler parameters are consistent with  impaired relaxation pattern of LV diastolic filling.  3. Global right ventricle has normal systolic function.The right  ventricular size is normal. No increase in right ventricular wall  thickness.  4. Left atrial size was normal.  5. Right atrial size was normal.  6. The mitral valve is normal in structure. No evidence of mitral valve  regurgitation. No evidence of mitral stenosis.  7. The tricuspid valve is normal in structure. Tricuspid valve  regurgitation was not visualized by color flow Doppler.  8. The aortic valve is tricuspid Aortic valve regurgitation is mild to  moderate by color flow Doppler.  Structurally normal aortic valve, with no  evidence of sclerosis or stenosis.  9. The pulmonic valve was not well visualized. Pulmonic valve  regurgitation is not visualized by color flow Doppler.  10. Normal pulmonary artery systolic pressure.   09/2017 nuclear stress:  There was no ST segment deviation noted during stress. Nonspecific T wave flattening in aVL and V2 seen throughout study.  Defect 1: There is a medium defect of mild severity present in the mid inferior, apical inferior and apical lateral location. This appears to be due to soft tissue attenuation given normal regional wall motion. No ischemic territories.  This is a low risk study.  Nuclear stress EF: 65%.  09/2011 Cath: Hemodynamic Findings: Central aortic pressure: 108/58  Left ventricular pressure: 107/10/14  Angiographic Findings:  Left main: No obstructive disease noted.  Left Anterior Descending Artery: Moderate to large sized vessel that courses to the apex. There is a moderate sized diagonal branch. No obstructive disease noted.  Circumflex Artery: Large, dominant artery with moderate sized first obtuse marginal branch and left sided posterolateral branch with no disease noted.  Right Coronary Artery: Small, non-dominant vessel with no disease noted.  Left Ventricular Angiogram:LVEF=65%.  Impression:  1. No angiographic evidence of CAD  2. Normal LV systolic function  3. Non-cardiac chest pain  Recommendations: No further ischemic workup.  Complications: None. The patient tolerated the procedure well.     Zannie Cove [facility] Short Stay Center/Anesthesiology Phone [phone] 11/09/2019 2:01 PM

## 2019-11-10 ENCOUNTER — Encounter: Payer: Self-pay | Admitting: Nurse Practitioner

## 2019-11-10 ENCOUNTER — Ambulatory Visit (INDEPENDENT_AMBULATORY_CARE_PROVIDER_SITE_OTHER): Payer: 59 | Admitting: Nurse Practitioner

## 2019-11-10 ENCOUNTER — Other Ambulatory Visit: Payer: Self-pay

## 2019-11-10 VITALS — BP 129/81 | HR 92 | Temp 98.0°F | Resp 20 | Ht 62.0 in | Wt 148.0 lb

## 2019-11-10 DIAGNOSIS — F5101 Primary insomnia: Secondary | ICD-10-CM

## 2019-11-10 DIAGNOSIS — M810 Age-related osteoporosis without current pathological fracture: Secondary | ICD-10-CM

## 2019-11-10 DIAGNOSIS — A6004 Herpesviral vulvovaginitis: Secondary | ICD-10-CM

## 2019-11-10 DIAGNOSIS — F331 Major depressive disorder, recurrent, moderate: Secondary | ICD-10-CM

## 2019-11-10 DIAGNOSIS — B009 Herpesviral infection, unspecified: Secondary | ICD-10-CM

## 2019-11-10 DIAGNOSIS — R55 Syncope and collapse: Secondary | ICD-10-CM | POA: Diagnosis not present

## 2019-11-10 DIAGNOSIS — F411 Generalized anxiety disorder: Secondary | ICD-10-CM

## 2019-11-10 DIAGNOSIS — G4733 Obstructive sleep apnea (adult) (pediatric): Secondary | ICD-10-CM | POA: Diagnosis not present

## 2019-11-10 DIAGNOSIS — I10 Essential (primary) hypertension: Secondary | ICD-10-CM

## 2019-11-10 DIAGNOSIS — E663 Overweight: Secondary | ICD-10-CM

## 2019-11-10 DIAGNOSIS — I5032 Chronic diastolic (congestive) heart failure: Secondary | ICD-10-CM | POA: Diagnosis not present

## 2019-11-10 DIAGNOSIS — R569 Unspecified convulsions: Secondary | ICD-10-CM

## 2019-11-10 DIAGNOSIS — M5412 Radiculopathy, cervical region: Secondary | ICD-10-CM

## 2019-11-10 DIAGNOSIS — K2100 Gastro-esophageal reflux disease with esophagitis, without bleeding: Secondary | ICD-10-CM

## 2019-11-10 DIAGNOSIS — E785 Hyperlipidemia, unspecified: Secondary | ICD-10-CM

## 2019-11-10 DIAGNOSIS — J309 Allergic rhinitis, unspecified: Secondary | ICD-10-CM

## 2019-11-10 DIAGNOSIS — R0789 Other chest pain: Secondary | ICD-10-CM

## 2019-11-10 MED ORDER — ISOSORBIDE MONONITRATE ER 30 MG PO TB24: 30.0000 mg | ORAL_TABLET | Freq: Every day | ORAL | 1 refills | Status: DC

## 2019-11-10 MED ORDER — BUSPIRONE HCL 10 MG PO TABS: 10.0000 mg | ORAL_TABLET | Freq: Two times a day (BID) | ORAL | 0 refills | Status: DC | PRN

## 2019-11-10 MED ORDER — OXYBUTYNIN CHLORIDE 5 MG PO TABS: 5.0000 mg | ORAL_TABLET | Freq: Every day | ORAL | 1 refills | Status: DC

## 2019-11-10 MED ORDER — HYDROCODONE-ACETAMINOPHEN 7.5-325 MG PO TABS: 1.0000 | ORAL_TABLET | Freq: Two times a day (BID) | ORAL | 0 refills | Status: DC

## 2019-11-10 MED ORDER — DULOXETINE HCL 60 MG PO CPEP: 60.0000 mg | ORAL_CAPSULE | Freq: Every day | ORAL | 1 refills | Status: DC

## 2019-11-10 MED ORDER — LAMOTRIGINE 150 MG PO TABS: 150.0000 mg | ORAL_TABLET | Freq: Every day | ORAL | 1 refills | Status: DC

## 2019-11-10 MED ORDER — DEXILANT 60 MG PO CPDR: 1.0000 | DELAYED_RELEASE_CAPSULE | Freq: Every day | ORAL | 1 refills | Status: DC

## 2019-11-10 MED ORDER — ONETOUCH DELICA PLUS LANCET33G MISC: 5 refills | Status: DC

## 2019-11-10 MED ORDER — BREO ELLIPTA 200-25 MCG/INH IN AEPB: 1.0000 | INHALATION_SPRAY | Freq: Every day | RESPIRATORY_TRACT | 5 refills | Status: DC

## 2019-11-10 MED ORDER — FUROSEMIDE 40 MG PO TABS: ORAL_TABLET | ORAL | 1 refills | Status: DC

## 2019-11-10 MED ORDER — LIDOCAINE 5 % EX OINT: TOPICAL_OINTMENT | CUTANEOUS | 0 refills | Status: DC

## 2019-11-10 MED ORDER — SUCRALFATE 1 G PO TABS: ORAL_TABLET | ORAL | 0 refills | Status: DC

## 2019-11-10 MED ORDER — LEVETIRACETAM 500 MG PO TABS: ORAL_TABLET | ORAL | 1 refills | Status: DC

## 2019-11-10 MED ORDER — VALACYCLOVIR HCL 500 MG PO TABS: 500.0000 mg | ORAL_TABLET | Freq: Two times a day (BID) | ORAL | 1 refills | Status: DC

## 2019-11-10 MED ORDER — ROSUVASTATIN CALCIUM 10 MG PO TABS: 10.0000 mg | ORAL_TABLET | Freq: Every day | ORAL | 1 refills | Status: DC

## 2019-11-10 MED ORDER — TRAZODONE HCL 150 MG PO TABS: 150.0000 mg | ORAL_TABLET | Freq: Every day | ORAL | 1 refills | Status: DC

## 2019-11-10 MED ORDER — ESCITALOPRAM OXALATE 20 MG PO TABS: 20.0000 mg | ORAL_TABLET | Freq: Every day | ORAL | 2 refills | Status: DC

## 2019-11-10 NOTE — Progress Notes (Signed)
Subjective:    Patient ID: [name], female    DOB: 06-01-1955, 64 y.o.   MRN: [id number]   Chief Complaint: Medical Management of Chronic Issues    HPI:  1. Essential hypertension No c/o chest pain, sob or headache. She checks her bloodpressure at home. It is up and down and she will take an extra blood pressure med when it is elevated BP Readings from Last 3 Encounters:  11/08/19 140/78  10/04/19 118/82  09/29/19 130/86   2. Chronic diastolic CHF (congestive heart failure) (HCC) She has occasional sob and edema. Last saw cardiology  10/04/18. They added florinef dialy and middrine prn.   3. SYNCOPE Still having syncopal episodes. A loop recorder was put in to monitor for arrhythmia. No abnormal heart beat was noted. She has been referred to [facility]. She was cleared for shoulder replacement  4. Obstructive sleep apnea syndrome She does not have a cpap machine. She says sh eis suppose to have already gotten it but she has not.  5. Gastroesophageal reflux disease with esophagitis without hemorrhage Is on dexilant and carafate. She is still having symptoms most days.  6. Cervical radiculitis Constant neck pain. She cannot do anything about it right now because she has git to get her shoulder fixed first.  7. Age-related osteoporosis without current pathological fracture Last dexascan was in 2019. She will have repeated once our machine is repaired.  8. GAD (generalized anxiety disorder) She is very stressed because she needs this shoulder surgery  9. Hyperlipidemia with target LDL less than 100 Does not watch diet and does very little exercise. Lab Results  Component Value Date   CHOL 207 (H) 06/09/2019   HDL 48 06/09/2019   LDLCALC 116 (H) 06/09/2019   LDLDIRECT 142.1 04/22/2007   TRIG 249 (H) 06/09/2019   CHOLHDL 4.3 06/09/2019     10. Primary insomnia Has nit been sleeping well due  To right shoulder pain  11. Seizures (HCC) No recent seizure  activity  12. Overweight (BMI 25.0-29.9) No recent weight changes Wt Readings from Last 3 Encounters:  11/10/19 148 lb (67.1 kg)  11/08/19 149 lb (67.6 kg)  10/04/19 152 lb 6.4 oz (69.1 kg)   BMI Readings from Last 3 Encounters:  11/10/19 27.07 kg/m  11/08/19 27.25 kg/m  10/04/19 27.87 kg/m       Outpatient Encounter Medications as of 11/10/2019  Medication Sig  . acyclovir ointment (ZOVIRAX) 5 % APPLY OINTMENT TO AFFECTED AREA EVERY 3 HOURS. (Patient taking differently: Apply 1 application topically daily as needed (fever blisters). )  . albuterol (PROVENTIL) (2.5 MG/3ML) 0.083% nebulizer solution USE 1 VIAL IN NEBULIZER EVERY 4 HOURS AS NEEDED FOR WHEEZING. (Patient taking differently: Take 2.5 mg by nebulization every 4 (four) hours as needed for wheezing. )  . albuterol (VENTOLIN HFA) 108 (90 Base) MCG/ACT inhaler INHALE 2 PUFFS EVERY 6 HOURS AS NEEDED FOR SHORTNESS OF BREATH AND WHEEZING. (Patient taking differently: Inhale 2 puffs into the lungs every 6 (six) hours as needed for wheezing or shortness of breath. INHALE 2 PUFFS EVERY 6 HOURS AS NEEDED FOR SHORTNESS OF BREATH AND WHEEZING.)  . Alcohol Swabs (GLOBAL ALCOHOL PREP EASE) 70 % PADS USE 1 PAD DAILY WHEN CHECKING BLOOD SUGAR. R73.03  . BAC 50-325-40 MG tablet TAKE 1 TABLET BY MOUTH EVERY 6 HOURS AS NEEDED FOR HEADACHE. (Patient taking differently: Take 1 tablet by mouth every 6 (six) hours as needed for headache. )  . busPIRone (BUSPAR) 10  MG tablet TAKE 1 TABLET BY MOUTH TWICE DAILY AS NEEDED. (Patient taking differently: Take 10 mg by mouth 2 (two) times daily. )  . carvedilol (COREG) 6.25 MG tablet TAKE 1 & 1/2 TABLETS BY MOUTH TWICE DAILY. (Patient taking differently: Take 9.375 mg by mouth 2 (two) times daily with a meal. TAKE 1 & 1/2 TABLETS BY MOUTH TWICE DAILY.)  . celecoxib (CELEBREX) 200 MG capsule TAKE 1 CAPSULE DAILY FOR 7 DAYS, THEN TAKE 1 CAPSULE 2 TIMES A DAY FOR 21 DAYS. (Patient taking differently: Take 200  mg by mouth daily. )  . chlorzoxazone (PARAFON) 500 MG tablet TAKE (1) TABLET EVERY SIX HOURS AS NEEDED FOR MUSCLE SPASMS.  [name] dexlansoprazole (DEXILANT) 60 MG capsule Take 1 capsule (60 mg total) by mouth daily.  . diclofenac Sodium (VOLTAREN) 1 % GEL Apply 4 g topically 4 (four) times daily. (Patient taking differently: Apply 4 g topically 4 (four) times daily as needed (pain). )  . DULoxetine (CYMBALTA) 60 MG capsule Take 1 capsule (60 mg total) by mouth at bedtime.  . empagliflozin (JARDIANCE) 25 MG TABS tablet Take 1 tablet (25 mg total) by mouth daily before breakfast.  . escitalopram (LEXAPRO) 20 MG tablet TAKE 1 TABLET ONCE DAILY. (Patient taking differently: Take 20 mg by mouth daily. )  . estradiol (ESTRACE) 0.1 MG/GM vaginal cream PLACE 1 APPLICATORFUL VAGINALLY AT BEDTIME.  . fludrocortisone (FLORINEF) 0.1 MG tablet TAKE 1 TABLET EVERY OTHER DAY. (Patient taking differently: Take 0.1 mg by mouth every other day. )  . fluticasone (FLONASE) 50 MCG/ACT nasal spray SPRAY 2 SPRAYS IN EACH NOSTRIL ONCE DAILY. (Patient taking differently: Place 2 sprays into both nostrils daily as needed for allergies. As needed)  . fluticasone furoate-vilanterol (BREO ELLIPTA) 200-25 MCG/INH AEPB Inhale 1 puff into the lungs daily.  . furosemide (LASIX) 40 MG tablet TAKE 1 TABLET ONCE DAILY AS NEEDED FOR FLUID. (Patient taking differently: Take 40 mg by mouth daily as needed (high BP). TAKE 1 TABLET ONCE DAILY AS NEEDED FOR FLUID.)  . ipratropium (ATROVENT) 0.02 % nebulizer solution USE 1 VIAL IN NEBULIZER EVERY 4 HOURS AS NEEDED FOR WHEEZING. (Patient taking differently: Take 0.5 mg by nebulization every 4 (four) hours as needed for wheezing or shortness of breath. )  . isosorbide mononitrate (IMDUR) 30 MG 24 hr tablet Take 1 tablet (30 mg total) by mouth daily.  [name] lamoTRIgine (LAMICTAL) 150 MG tablet Take 1 tablet (150 mg total) by mouth daily.  . Lancets (ONETOUCH DELICA PLUS LANCET33G) MISC USE TO CHECK SUGAR  DAILY.  [name] levETIRAcetam (KEPPRA) 500 MG tablet TAKE (1) TABLET TWICE DAILY. (Patient taking differently: Take 500 mg by mouth 2 (two) times daily. TAKE (1) TABLET TWICE DAILY.)  . levocetirizine (XYZAL) 5 MG tablet TAKE 1 TABLET BY MOUTH EVERY MORNING. (Patient taking differently: Take 5 mg by mouth daily as needed for allergies. )  . lidocaine (XYLOCAINE) 5 % ointment APPLY AFFECTED AREA THREE TIMES DAILY AS NEEDED FOR MILD OR MODERATE PAIN. (Patient taking differently: Apply 1 application topically 3 (three) times daily as needed for mild pain or moderate pain. )  . nitroGLYCERIN (NITROSTAT) 0.4 MG SL tablet PLACE ONE (1) TABLET UNDER TONGUE EVERY 5 MINUTES UP TO (3) DOSES AS NEEDED FOR CHEST PAIN. (Patient taking differently: Place 0.4 mg under the tongue every 5 (five) minutes as needed for chest pain. )  . ondansetron (ZOFRAN) 4 MG tablet TAKE 1 TABLET BY MOUTH EVERY 8 HOURS AS NEEDED FOR NAUSEA AND  VOMITING. (Patient taking differently: Take 4 mg by mouth every 8 (eight) hours as needed for nausea or vomiting. TAKE 1 TABLET BY MOUTH EVERY 8 HOURS AS NEEDED FOR NAUSEA AND VOMITING.)  . oxybutynin (DITROPAN) 5 MG tablet Take 1 tablet (5 mg total) by mouth at bedtime.  . rosuvastatin (CRESTOR) 10 MG tablet Take 1 tablet (10 mg total) by mouth daily.  . sucralfate (CARAFATE) 1 g tablet TAKE 1 TABLET 4 TIMES A DAY WITH MEALS AND AT BEDTIME. (Patient taking differently: Take 1 g by mouth 4 (four) times daily -  with meals and at bedtime. )  . traZODone (DESYREL) 150 MG tablet Take 1 tablet (150 mg total) by mouth at bedtime.  . valACYclovir (VALTREX) 500 MG tablet Take 1 tablet (500 mg total) by mouth 2 (two) times daily.   No facility-administered encounter medications on file as of 11/10/2019.    Past Surgical History:  Procedure Laterality Date  . BACK SURGERY    . BREAST SURGERY     lumpectomy  . CARDIAC CATHETERIZATION  10/06/2011  . CATARACT EXTRACTION W/PHACO Right 07/31/2017   Procedure:  CATARACT EXTRACTION PHACO AND INTRAOCULAR LENS PLACEMENT (IOC);  Surgeon: Fabio Pierce, MD;  Location: [facility];  Service: Ophthalmology;  Laterality: Right;  CDE: 2.33  . CATARACT EXTRACTION W/PHACO Left 08/14/2017   Procedure: CATARACT EXTRACTION PHACO AND INTRAOCULAR LENS PLACEMENT (IOC);  Surgeon: Fabio Pierce, MD;  Location: [facility];  Service: Ophthalmology;  Laterality: Left;  CDE: 2.74  . CHOLECYSTECTOMY    . CYSTOSCOPY     stone  . DIAGNOSTIC LAPAROSCOPY     laparoscopic cholecystectomy  . DOPPLER ECHOCARDIOGRAPHY  2009  . EYE SURGERY    . head up tilt table testing  06/15/2007   [name]  . HEMORRHOID SURGERY    . insertion of implatable loop recorder  08/11/2007   [name]  . POSTERIOR CERVICAL FUSION/FORAMINOTOMY N/A 12/19/2013   Procedure: RIGHT C3-4.C4-5 AND C5-6 FORAMINOTOMIES;  Surgeon: Kerrin Champagne, MD;  Location: [facility] OR;  Service: Orthopedics;  Laterality: N/A;  . TUBAL LIGATION      Family History  Problem Relation Age of Onset  . Heart attack Father   . Cancer Father   . Mental illness Father   . Cancer Mother   . Mental illness Mother   . Heart attack Brother        stents  . Alcohol abuse Brother   . Heart disease Brother   . Drug abuse Brother   . Diabetes Brother   . Colon cancer Maternal Aunt   . Cirrhosis Brother   . Stomach cancer Neg Hx     New complaints: None today  Social history: Lives by herself  Controlled substance contract: 03/14/19    Review of Systems  Constitutional: Negative for diaphoresis.  Eyes: Negative for pain.  Respiratory: Negative for shortness of breath.   Cardiovascular: Negative for chest pain, palpitations and leg swelling.  Gastrointestinal: Negative for abdominal pain.  Endocrine: Negative for polydipsia.  Skin: Negative for rash.  Neurological: Negative for dizziness, weakness and headaches.  Hematological: Does not bruise/bleed easily.  All other systems reviewed and are negative.        Objective:   Physical Exam Vitals and nursing note reviewed.  Constitutional:      General: She is not in acute distress.    Appearance: Normal appearance. She is well-developed.  HENT:     Head: Normocephalic.     Nose: Nose normal.  Eyes:     Pupils: Pupils are equal, round, and reactive to light.  Neck:     Vascular: No carotid bruit or JVD.  Cardiovascular:     Rate and Rhythm: Normal rate and regular rhythm.     Heart sounds: Normal heart sounds.  Pulmonary:     Effort: Pulmonary effort is normal. No respiratory distress.     Breath sounds: Normal breath sounds. No wheezing or rales.  Chest:     Chest wall: No tenderness.  Abdominal:     General: Bowel sounds are normal. There is no distension or abdominal bruit.     Palpations: Abdomen is soft. There is no hepatomegaly, splenomegaly, mass or pulsatile mass.     Tenderness: There is no abdominal tenderness.  Musculoskeletal:        General: Normal range of motion.     Cervical back: Normal range of motion and neck supple.     Comments: Unable to move right shoulder without pain  Lymphadenopathy:     Cervical: No cervical adenopathy.  Skin:    General: Skin is warm and dry.  Neurological:     Mental Status: She is alert and oriented to person, place, and time.     Deep Tendon Reflexes: Reflexes are normal and symmetric.  Psychiatric:        Behavior: Behavior normal.        Thought Content: Thought content normal.        Judgment: Judgment normal.     BP 129/81   Pulse 92   Temp 98 F (36.7 C) (Temporal)   Resp 20   Ht 5\' 2"  (1.575 m)   Wt 148 lb (67.1 kg)   SpO2 98%   BMI 27.07 kg/m        Assessment & Plan:  [name] comes in today with chief complaint of Medical Management of Chronic Issues   Diagnosis and orders addressed:  1. Essential hypertension Low sodium diet - furosemide (LASIX) 40 MG tablet; TAKE 1 TABLET ONCE DAILY AS NEEDED FOR FLUID.  Dispense: 90 tablet; Refill: 1  2.  Chronic diastolic CHF (congestive heart failure) (HCC) Keep follow  Up with cardiology  3. SYNCOPE Keep diary of events  4. Obstructive sleep apnea syndrome Will get CPAP once her shoulder surgery is over  5. Gastroesophageal reflux disease with esophagitis without hemorrhage Avoid spicy foods Do not eat 2 hours prior to bedtime  6. Cervical radiculitis  7. Age-related osteoporosis without current pathological fracture  8. GAD (generalized anxiety disorder) Stress management  9. Hyperlipidemia with target LDL less than 100 Low fat diet - rosuvastatin (CRESTOR) 10 MG tablet; Take 1 tablet (10 mg total) by mouth daily.  Dispense: 90 tablet; Refill: 1  10. Primary insomnia Bedtime routine - traZODone (DESYREL) 150 MG tablet; Take 1 tablet (150 mg total) by mouth at bedtime.  Dispense: 90 tablet; Refill: 1  11. Seizures (HCC) - levETIRAcetam (KEPPRA) 500 MG tablet; TAKE (1) TABLET TWICE DAILY.  Dispense: 180 tablet; Refill: 1  12. Overweight (BMI 25.0-29.9) Discussed diet and exercise for person with BMI >25 Will recheck weight in 3-6 months  13. Allergic rhinitis, unspecified seasonality, unspecified trigger - fluticasone furoate-vilanterol (BREO ELLIPTA) 200-25 MCG/INH AEPB; Inhale 1 puff into the lungs daily.  Dispense: 60 each; Refill: 5  14. Anxiety state - busPIRone (BUSPAR) 10 MG tablet; Take 1 tablet (10 mg total) by mouth 2 (two) times daily as needed.  Dispense: 60 tablet; Refill: 0  15. Gastroesophageal reflux disease with esophagitis  sucralfate (CARAFATE) 1 g tablet; TAKE 1 TABLET 4 TIMES A DAY WITH MEALS AND AT BEDTIME.  Dispense: 90 tablet; Refill: 0 - dexlansoprazole (DEXILANT) 60 MG capsule; Take 1 capsule (60 mg total) by mouth daily.  Dispense: 90 capsule; Refill: 1  16. Herpes simplex virus (HSV) infection - valACYclovir (VALTREX) 500 MG tablet; Take 1 tablet (500 mg total) by mouth 2 (two) times daily.  Dispense: 180 tablet; Refill: 1 - lidocaine  (XYLOCAINE) 5 % ointment; APPLY AFFECTED AREA THREE TIMES DAILY AS NEEDED FOR MILD OR MODERATE PAIN.  Dispense: 50 g; Refill: 0  17. Herpes simplex vulvovaginitis - valACYclovir (VALTREX) 500 MG tablet; Take 1 tablet (500 mg total) by mouth 2 (two) times daily.  Dispense: 180 tablet; Refill: 1 - lidocaine (XYLOCAINE) 5 % ointment; APPLY AFFECTED AREA THREE TIMES DAILY AS NEEDED FOR MILD OR MODERATE PAIN.  Dispense: 50 g; Refill: 0  18. Moderate episode of recurrent major depressive disorder (HCC) - escitalopram (LEXAPRO) 20 MG tablet; Take 1 tablet (20 mg total) by mouth daily.  Dispense: 30 tablet; Refill: 2 - DULoxetine (CYMBALTA) 60 MG capsule; Take 1 capsule (60 mg total) by mouth at bedtime.  Dispense: 90 capsule; Refill: 1  19. Other chest pain - isosorbide mononitrate (IMDUR) 30 MG 24 hr tablet; Take 1 tablet (30 mg total) by mouth daily.  Dispense: 90 tablet; Refill: 1   Labs pending Health Maintenance reviewed Diet and exercise encouraged  Follow up plan: 6 months   [name] Daphine Deutscher, FNP

## 2019-11-11 ENCOUNTER — Other Ambulatory Visit (HOSPITAL_COMMUNITY)
Admission: RE | Admit: 2019-11-11 | Discharge: 2019-11-11 | Disposition: A | Discharge status: Home / Self Care | Payer: 59 | Source: Ambulatory Visit | Attending: Orthopedic Surgery | Admitting: Orthopedic Surgery

## 2019-11-11 DIAGNOSIS — Z20822 Contact with and (suspected) exposure to covid-19: Secondary | ICD-10-CM | POA: Diagnosis not present

## 2019-11-11 DIAGNOSIS — Z01812 Encounter for preprocedural laboratory examination: Secondary | ICD-10-CM | POA: Insufficient documentation

## 2019-11-11 LAB — SARS CORONAVIRUS 2 (TAT 6-24 HRS): SARS Coronavirus 2: NEGATIVE

## 2019-11-15 ENCOUNTER — Encounter (HOSPITAL_COMMUNITY): Payer: Self-pay | Admitting: Orthopedic Surgery

## 2019-11-15 ENCOUNTER — Ambulatory Visit (HOSPITAL_COMMUNITY): Payer: 59 | Admitting: Certified Registered Nurse Anesthetist

## 2019-11-15 ENCOUNTER — Observation Stay (HOSPITAL_COMMUNITY)
Admission: RE | Admit: 2019-11-15 | Discharge: 2019-11-16 | Disposition: A | Discharge status: Home / Self Care | Payer: 59 | Attending: Orthopedic Surgery | Admitting: Orthopedic Surgery

## 2019-11-15 ENCOUNTER — Other Ambulatory Visit: Payer: Self-pay

## 2019-11-15 ENCOUNTER — Ambulatory Visit (HOSPITAL_COMMUNITY): Payer: 59 | Admitting: Physician Assistant

## 2019-11-15 ENCOUNTER — Encounter (HOSPITAL_COMMUNITY)
Admission: RE | Disposition: A | Discharge status: Home / Self Care | Payer: Self-pay | Source: Home / Self Care | Attending: Orthopedic Surgery

## 2019-11-15 DIAGNOSIS — I252 Old myocardial infarction: Secondary | ICD-10-CM | POA: Diagnosis not present

## 2019-11-15 DIAGNOSIS — I110 Hypertensive heart disease with heart failure: Secondary | ICD-10-CM | POA: Diagnosis not present

## 2019-11-15 DIAGNOSIS — E119 Type 2 diabetes mellitus without complications: Secondary | ICD-10-CM | POA: Insufficient documentation

## 2019-11-15 DIAGNOSIS — I509 Heart failure, unspecified: Secondary | ICD-10-CM | POA: Diagnosis not present

## 2019-11-15 DIAGNOSIS — Z96611 Presence of right artificial shoulder joint: Secondary | ICD-10-CM

## 2019-11-15 DIAGNOSIS — F319 Bipolar disorder, unspecified: Secondary | ICD-10-CM | POA: Insufficient documentation

## 2019-11-15 DIAGNOSIS — I25119 Atherosclerotic heart disease of native coronary artery with unspecified angina pectoris: Secondary | ICD-10-CM | POA: Insufficient documentation

## 2019-11-15 DIAGNOSIS — M797 Fibromyalgia: Secondary | ICD-10-CM | POA: Diagnosis not present

## 2019-11-15 DIAGNOSIS — M19011 Primary osteoarthritis, right shoulder: Secondary | ICD-10-CM | POA: Diagnosis present

## 2019-11-15 DIAGNOSIS — Z79899 Other long term (current) drug therapy: Secondary | ICD-10-CM | POA: Diagnosis not present

## 2019-11-15 DIAGNOSIS — J449 Chronic obstructive pulmonary disease, unspecified: Secondary | ICD-10-CM | POA: Diagnosis not present

## 2019-11-15 HISTORY — PX: TOTAL SHOULDER ARTHROPLASTY: SHX126

## 2019-11-15 LAB — TYPE AND SCREEN
ABO/RH(D): A POS
Antibody Screen: NEGATIVE

## 2019-11-15 LAB — GLUCOSE, CAPILLARY
Glucose-Capillary: 92 mg/dL (ref 70–99)
Glucose-Capillary: 131 mg/dL — ABNORMAL HIGH (ref 70–99)

## 2019-11-15 SURGERY — ARTHROPLASTY, SHOULDER, TOTAL
Anesthesia: General | Site: Shoulder | Laterality: Right

## 2019-11-15 MED ORDER — EPHEDRINE SULFATE-NACL 50-0.9 MG/10ML-% IV SOSY
PREFILLED_SYRINGE | INTRAVENOUS | Status: DC | PRN
  Administered 2019-11-15: 5 mg via INTRAVENOUS
  Administered 2019-11-15 (×2): 10 mg via INTRAVENOUS
  Administered 2019-11-15: 5 mg via INTRAVENOUS
  Administered 2019-11-15 (×2): 10 mg via INTRAVENOUS
  Administered 2019-11-15: 5 mg via INTRAVENOUS

## 2019-11-15 MED ORDER — MENTHOL 3 MG MT LOZG: 1.0000 | LOZENGE | OROMUCOSAL | Status: DC | PRN

## 2019-11-15 MED ORDER — PHENYLEPHRINE 40 MCG/ML (10ML) SYRINGE FOR IV PUSH (FOR BLOOD PRESSURE SUPPORT)
PREFILLED_SYRINGE | INTRAVENOUS | Status: AC
  Filled 2019-11-15: qty 10

## 2019-11-15 MED ORDER — POVIDONE-IODINE 7.5 % EX SOLN: Freq: Once | CUTANEOUS | Status: DC

## 2019-11-15 MED ORDER — CEFAZOLIN SODIUM-DEXTROSE 1-4 GM/50ML-% IV SOLN
1.0000 g | Freq: Four times a day (QID) | INTRAVENOUS | Status: AC
  Administered 2019-11-15 – 2019-11-16 (×3): 1 g via INTRAVENOUS
  Filled 2019-11-15 (×3): qty 50

## 2019-11-15 MED ORDER — LACTATED RINGERS IV SOLN: INTRAVENOUS | Status: DC

## 2019-11-15 MED ORDER — CARVEDILOL 6.25 MG PO TABS
9.3750 mg | ORAL_TABLET | Freq: Two times a day (BID) | ORAL | Status: DC
  Filled 2019-11-15 (×2): qty 1

## 2019-11-15 MED ORDER — SUGAMMADEX SODIUM 200 MG/2ML IV SOLN
INTRAVENOUS | Status: DC | PRN
  Administered 2019-11-15: 100 mg via INTRAVENOUS

## 2019-11-15 MED ORDER — IRRISEPT - 450ML BOTTLE WITH 0.05% CHG IN STERILE WATER, USP 99.95% OPTIME
TOPICAL | Status: DC | PRN
  Administered 2019-11-15: 450 mL

## 2019-11-15 MED ORDER — METOCLOPRAMIDE HCL 5 MG/ML IJ SOLN: 5.0000 mg | Freq: Three times a day (TID) | INTRAMUSCULAR | Status: DC | PRN

## 2019-11-15 MED ORDER — AMISULPRIDE (ANTIEMETIC) 5 MG/2ML IV SOLN: 10.0000 mg | Freq: Once | INTRAVENOUS | Status: DC | PRN

## 2019-11-15 MED ORDER — EPHEDRINE 5 MG/ML INJ
INTRAVENOUS | Status: AC
  Filled 2019-11-15: qty 10

## 2019-11-15 MED ORDER — ACETAMINOPHEN 325 MG PO TABS: 325.0000 mg | ORAL_TABLET | Freq: Four times a day (QID) | ORAL | Status: DC | PRN

## 2019-11-15 MED ORDER — ASPIRIN EC 81 MG PO TBEC
81.0000 mg | DELAYED_RELEASE_TABLET | Freq: Every day | ORAL | Status: DC
  Administered 2019-11-16: 81 mg via ORAL
  Filled 2019-11-15: qty 1

## 2019-11-15 MED ORDER — PHENYLEPHRINE 40 MCG/ML (10ML) SYRINGE FOR IV PUSH (FOR BLOOD PRESSURE SUPPORT)
PREFILLED_SYRINGE | INTRAVENOUS | Status: DC | PRN
  Administered 2019-11-15 (×4): 80 ug via INTRAVENOUS

## 2019-11-15 MED ORDER — PROPOFOL 10 MG/ML IV BOLUS
INTRAVENOUS | Status: AC
  Filled 2019-11-15: qty 20

## 2019-11-15 MED ORDER — POVIDONE-IODINE 10 % EX SWAB
2.0000 "application " | Freq: Once | CUTANEOUS | Status: AC
  Administered 2019-11-15: 2 via TOPICAL

## 2019-11-15 MED ORDER — CHLORHEXIDINE GLUCONATE 0.12 % MT SOLN
15.0000 mL | Freq: Once | OROMUCOSAL | Status: AC
  Administered 2019-11-15: 15 mL via OROMUCOSAL

## 2019-11-15 MED ORDER — SUCRALFATE 1 G PO TABS
1.0000 g | ORAL_TABLET | Freq: Three times a day (TID) | ORAL | Status: DC
  Administered 2019-11-15 – 2019-11-16 (×3): 1 g via ORAL
  Filled 2019-11-15 (×3): qty 1

## 2019-11-15 MED ORDER — FENTANYL CITRATE (PF) 100 MCG/2ML IJ SOLN
INTRAMUSCULAR | Status: DC | PRN
  Administered 2019-11-15: 25 ug via INTRAVENOUS
  Administered 2019-11-15: 50 ug via INTRAVENOUS
  Administered 2019-11-15: 25 ug via INTRAVENOUS

## 2019-11-15 MED ORDER — ONDANSETRON HCL 4 MG/2ML IJ SOLN: 4.0000 mg | Freq: Four times a day (QID) | INTRAMUSCULAR | Status: DC | PRN

## 2019-11-15 MED ORDER — ACETAMINOPHEN 10 MG/ML IV SOLN
INTRAVENOUS | Status: DC | PRN
  Administered 2019-11-15: 1000 mg via INTRAVENOUS

## 2019-11-15 MED ORDER — LIDOCAINE 2% (20 MG/ML) 5 ML SYRINGE
INTRAMUSCULAR | Status: AC
  Filled 2019-11-15: qty 5

## 2019-11-15 MED ORDER — LEVETIRACETAM 500 MG PO TABS
500.0000 mg | ORAL_TABLET | Freq: Two times a day (BID) | ORAL | Status: DC
  Administered 2019-11-15 – 2019-11-16 (×2): 500 mg via ORAL
  Filled 2019-11-15 (×2): qty 1

## 2019-11-15 MED ORDER — ONDANSETRON HCL 4 MG/2ML IJ SOLN
INTRAMUSCULAR | Status: AC
  Filled 2019-11-15: qty 2

## 2019-11-15 MED ORDER — DEXAMETHASONE SODIUM PHOSPHATE 10 MG/ML IJ SOLN
INTRAMUSCULAR | Status: AC
  Filled 2019-11-15: qty 1

## 2019-11-15 MED ORDER — METHOCARBAMOL 500 MG IVPB - SIMPLE MED
500.0000 mg | Freq: Four times a day (QID) | INTRAVENOUS | Status: DC | PRN
  Filled 2019-11-15: qty 50

## 2019-11-15 MED ORDER — ONDANSETRON HCL 4 MG PO TABS: 4.0000 mg | ORAL_TABLET | Freq: Four times a day (QID) | ORAL | Status: DC | PRN

## 2019-11-15 MED ORDER — ACETAMINOPHEN 500 MG PO TABS
ORAL_TABLET | ORAL | Status: AC
  Filled 2019-11-15: qty 1

## 2019-11-15 MED ORDER — FENTANYL CITRATE (PF) 100 MCG/2ML IJ SOLN
50.0000 ug | INTRAMUSCULAR | Status: DC
  Administered 2019-11-15: 100 ug via INTRAVENOUS
  Filled 2019-11-15: qty 2

## 2019-11-15 MED ORDER — PANTOPRAZOLE SODIUM 40 MG PO TBEC
40.0000 mg | DELAYED_RELEASE_TABLET | Freq: Every day | ORAL | Status: DC
  Administered 2019-11-15 – 2019-11-16 (×2): 40 mg via ORAL
  Filled 2019-11-15 (×2): qty 1

## 2019-11-15 MED ORDER — LIDOCAINE 2% (20 MG/ML) 5 ML SYRINGE
INTRAMUSCULAR | Status: DC | PRN
  Administered 2019-11-15: 20 mg via INTRAVENOUS

## 2019-11-15 MED ORDER — ROSUVASTATIN CALCIUM 10 MG PO TABS
10.0000 mg | ORAL_TABLET | Freq: Every day | ORAL | Status: DC
  Administered 2019-11-16: 10 mg via ORAL
  Filled 2019-11-15: qty 1

## 2019-11-15 MED ORDER — DOCUSATE SODIUM 100 MG PO CAPS
100.0000 mg | ORAL_CAPSULE | Freq: Two times a day (BID) | ORAL | Status: DC
  Administered 2019-11-16: 100 mg via ORAL
  Filled 2019-11-15 (×2): qty 1

## 2019-11-15 MED ORDER — BUPIVACAINE-EPINEPHRINE (PF) 0.5% -1:200000 IJ SOLN
INTRAMUSCULAR | Status: DC | PRN
Start: 2019-11-15 — End: 2019-11-15
  Administered 2019-11-15: 15 mL via PERINEURAL

## 2019-11-15 MED ORDER — DEXAMETHASONE SODIUM PHOSPHATE 4 MG/ML IJ SOLN
INTRAMUSCULAR | Status: DC | PRN
  Administered 2019-11-15: 5 mg via INTRAVENOUS

## 2019-11-15 MED ORDER — CELECOXIB 200 MG PO CAPS
200.0000 mg | ORAL_CAPSULE | Freq: Two times a day (BID) | ORAL | Status: DC
  Administered 2019-11-15 – 2019-11-16 (×2): 200 mg via ORAL
  Filled 2019-11-15 (×2): qty 1

## 2019-11-15 MED ORDER — ALBUTEROL SULFATE (2.5 MG/3ML) 0.083% IN NEBU: 2.5000 mg | INHALATION_SOLUTION | RESPIRATORY_TRACT | Status: DC | PRN

## 2019-11-15 MED ORDER — ACETAMINOPHEN 10 MG/ML IV SOLN
INTRAVENOUS | Status: AC
  Filled 2019-11-15: qty 100

## 2019-11-15 MED ORDER — VANCOMYCIN HCL 1000 MG IV SOLR
INTRAVENOUS | Status: DC | PRN
  Administered 2019-11-15: 1000 mg via TOPICAL

## 2019-11-15 MED ORDER — ROCURONIUM BROMIDE 10 MG/ML (PF) SYRINGE
PREFILLED_SYRINGE | INTRAVENOUS | Status: AC
  Filled 2019-11-15: qty 10

## 2019-11-15 MED ORDER — PROPOFOL 10 MG/ML IV BOLUS
INTRAVENOUS | Status: DC | PRN
  Administered 2019-11-15: 150 mg via INTRAVENOUS

## 2019-11-15 MED ORDER — PHENOL 1.4 % MT LIQD: 1.0000 | OROMUCOSAL | Status: DC | PRN

## 2019-11-15 MED ORDER — METHOCARBAMOL 500 MG PO TABS
500.0000 mg | ORAL_TABLET | Freq: Four times a day (QID) | ORAL | Status: DC | PRN
  Administered 2019-11-15 – 2019-11-16 (×3): 500 mg via ORAL
  Filled 2019-11-15 (×3): qty 1

## 2019-11-15 MED ORDER — ONDANSETRON HCL 4 MG/2ML IJ SOLN
INTRAMUSCULAR | Status: DC | PRN
  Administered 2019-11-15: 4 mg via INTRAVENOUS

## 2019-11-15 MED ORDER — ALBUTEROL SULFATE (2.5 MG/3ML) 0.083% IN NEBU: 3.0000 mL | INHALATION_SOLUTION | Freq: Four times a day (QID) | RESPIRATORY_TRACT | Status: DC | PRN

## 2019-11-15 MED ORDER — 0.9 % SODIUM CHLORIDE (POUR BTL) OPTIME
TOPICAL | Status: DC | PRN
  Administered 2019-11-15 (×3): 1000 mL
  Administered 2019-11-15: 3000 mL

## 2019-11-15 MED ORDER — DULOXETINE HCL 60 MG PO CPEP
60.0000 mg | ORAL_CAPSULE | Freq: Every day | ORAL | Status: DC
  Administered 2019-11-15: 60 mg via ORAL
  Filled 2019-11-15: qty 1

## 2019-11-15 MED ORDER — HYDROCODONE-ACETAMINOPHEN 7.5-325 MG PO TABS
1.0000 | ORAL_TABLET | ORAL | Status: DC | PRN
  Administered 2019-11-15 – 2019-11-16 (×4): 2 via ORAL
  Filled 2019-11-15 (×4): qty 2

## 2019-11-15 MED ORDER — FENTANYL CITRATE (PF) 100 MCG/2ML IJ SOLN: 25.0000 ug | INTRAMUSCULAR | Status: DC | PRN

## 2019-11-15 MED ORDER — ORAL CARE MOUTH RINSE: 15.0000 mL | Freq: Once | OROMUCOSAL | Status: AC

## 2019-11-15 MED ORDER — BUTALBITAL-APAP-CAFFEINE 50-325-40 MG PO TABS
1.0000 | ORAL_TABLET | Freq: Four times a day (QID) | ORAL | Status: DC | PRN
  Administered 2019-11-15: 1 via ORAL
  Filled 2019-11-15: qty 1

## 2019-11-15 MED ORDER — BUPIVACAINE LIPOSOME 1.3 % IJ SUSP
INTRAMUSCULAR | Status: DC | PRN
  Administered 2019-11-15: 10 mL via PERINEURAL

## 2019-11-15 MED ORDER — ESCITALOPRAM OXALATE 20 MG PO TABS
20.0000 mg | ORAL_TABLET | Freq: Every day | ORAL | Status: DC
  Administered 2019-11-15 – 2019-11-16 (×2): 20 mg via ORAL
  Filled 2019-11-15 (×2): qty 1

## 2019-11-15 MED ORDER — ROCURONIUM BROMIDE 10 MG/ML (PF) SYRINGE
PREFILLED_SYRINGE | INTRAVENOUS | Status: DC | PRN
  Administered 2019-11-15: 30 mg via INTRAVENOUS

## 2019-11-15 MED ORDER — LAMOTRIGINE 150 MG PO TABS
150.0000 mg | ORAL_TABLET | Freq: Every day | ORAL | Status: DC
  Administered 2019-11-16: 150 mg via ORAL
  Filled 2019-11-15 (×2): qty 1

## 2019-11-15 MED ORDER — FENTANYL CITRATE (PF) 100 MCG/2ML IJ SOLN
INTRAMUSCULAR | Status: AC
  Filled 2019-11-15: qty 2

## 2019-11-15 MED ORDER — TRANEXAMIC ACID-NACL 1000-0.7 MG/100ML-% IV SOLN
1000.0000 mg | INTRAVENOUS | Status: AC
  Administered 2019-11-15: 1000 mg via INTRAVENOUS
  Filled 2019-11-15: qty 100

## 2019-11-15 MED ORDER — CEFAZOLIN SODIUM-DEXTROSE 2-4 GM/100ML-% IV SOLN
2.0000 g | INTRAVENOUS | Status: AC
  Administered 2019-11-15: 2 g via INTRAVENOUS
  Filled 2019-11-15: qty 100

## 2019-11-15 MED ORDER — EMPAGLIFLOZIN 25 MG PO TABS
25.0000 mg | ORAL_TABLET | Freq: Every day | ORAL | Status: DC
  Administered 2019-11-16: 25 mg via ORAL
  Filled 2019-11-15: qty 1

## 2019-11-15 MED ORDER — FUROSEMIDE 40 MG PO TABS: 40.0000 mg | ORAL_TABLET | Freq: Every day | ORAL | Status: DC | PRN

## 2019-11-15 MED ORDER — METOCLOPRAMIDE HCL 5 MG PO TABS: 5.0000 mg | ORAL_TABLET | Freq: Three times a day (TID) | ORAL | Status: DC | PRN

## 2019-11-15 MED ORDER — MIDAZOLAM HCL 2 MG/2ML IJ SOLN
1.0000 mg | INTRAMUSCULAR | Status: DC
  Administered 2019-11-15: 2 mg via INTRAVENOUS
  Filled 2019-11-15: qty 2

## 2019-11-15 SURGICAL SUPPLY — 67 items
ADPR HD STD TPR HUM TI RVRS (Orthopedic Implant) ×1 IMPLANT
AID PSTN UNV HD RSTRNT DISP (MISCELLANEOUS)
BAG SPEC THK2 15X12 ZIP CLS (MISCELLANEOUS) ×1
BAG ZIPLOCK 12X15 (MISCELLANEOUS) ×2 IMPLANT
BASEPLATE GLENOID FIX SHOULDER (Shoulder) ×1 IMPLANT
BIT DRILL F/CENTRAL SCRW 3.2 (BIT) ×1
BIT DRILL F/CENTRAL SCRW 3.2MM (BIT) IMPLANT
BIT DRILL TWIST 2.7 (BIT) ×1 IMPLANT
BLADE SAW SGTL 18X1.27X75 (BLADE) ×2 IMPLANT
BLADE SURG SZ10 CARB STEEL (BLADE) ×4 IMPLANT
BOWL SMART MIX CTS (DISPOSABLE) ×2 IMPLANT
BSPLAT GLND STD STRL SHLDR (Shoulder) ×1 IMPLANT
CLSR STERI-STRIP ANTIMIC 1/2X4 (GAUZE/BANDAGES/DRESSINGS) ×1 IMPLANT
COVER BACK TABLE 60X90IN (DRAPES) ×2 IMPLANT
COVER MAYO STAND STRL (DRAPES) ×2 IMPLANT
COVER SURGICAL LIGHT HANDLE (MISCELLANEOUS) ×2 IMPLANT
COVER WAND RF STERILE (DRAPES) ×1 IMPLANT
DECANTER SPIKE VIAL GLASS SM (MISCELLANEOUS) ×2 IMPLANT
DRAPE U-SHAPE 47X51 STRL (DRAPES) ×2 IMPLANT
DRESSING AQUACEL AG SP 3.5X10 (GAUZE/BANDAGES/DRESSINGS) IMPLANT
DRILL BIT F/CENTRAL SCRW 3.2MM (BIT) ×2
DRSG AQUACEL AG SP 3.5X10 (GAUZE/BANDAGES/DRESSINGS) ×2
DRSG EMULSION OIL 3X16 NADH (GAUZE/BANDAGES/DRESSINGS) ×2 IMPLANT
DURAPREP 26ML APPLICATOR (WOUND CARE) ×2 IMPLANT
ELECT REM PT RETURN 15FT ADLT (MISCELLANEOUS) ×1 IMPLANT
EVACUATOR 1/8 PVC DRAIN (DRAIN) ×2 IMPLANT
GLOVE SURG ORTHO 8.0 STRL STRW (GLOVE) ×2 IMPLANT
GOWN STRL REUS W/TWL LRG LVL3 (GOWN DISPOSABLE) ×2 IMPLANT
GUIDE MODEL REV SHLD RT SZ2 (ORTHOPEDIC DISPOSABLE SUPPLIES) ×1 IMPLANT
HEAD HUMERAL BIPOLAR 43X21X42 (Miscellaneous) IMPLANT
HEAD HUMERAL COMP STD (Orthopedic Implant) IMPLANT
HUMERAL HEAD BIPOLAR 43X21X42 (Miscellaneous) ×2 IMPLANT
HUMERAL HEAD COMP STD (Orthopedic Implant) ×2 IMPLANT
JET LAVAGE IRRISEPT WOUND (IRRIGATION / IRRIGATOR) ×2
KIT BASIN OR (CUSTOM PROCEDURE TRAY) ×2 IMPLANT
KIT TURNOVER KIT A (KITS) IMPLANT
LAVAGE JET IRRISEPT WOUND (IRRIGATION / IRRIGATOR) IMPLANT
LINER GLENOID CONV E1 (Liner) ×1 IMPLANT
NDL MAYO CATGUT SZ4 TPR NDL (NEEDLE) ×1 IMPLANT
NEEDLE MAYO CATGUT SZ4 (NEEDLE) ×2 IMPLANT
PACK SHOULDER (CUSTOM PROCEDURE TRAY) ×2 IMPLANT
PASSER SUT SWANSON 36MM LOOP (INSTRUMENTS) ×2 IMPLANT
PENCIL SMOKE EVACUATOR (MISCELLANEOUS) IMPLANT
PIN STEINMANN THREADED TIP (PIN) ×1 IMPLANT
PIN THREADED REVERSE (PIN) ×1 IMPLANT
PROTECTOR NERVE ULNAR (MISCELLANEOUS) ×2 IMPLANT
RESTRAINT HEAD UNIVERSAL NS (MISCELLANEOUS) IMPLANT
RETRIEVER SUT HEWSON (MISCELLANEOUS) ×1 IMPLANT
SCREW BONE STRL 6.5MMX25MM (Screw) ×1 IMPLANT
SCREW LOCKING NS 4.75MMX20MM (Screw) ×1 IMPLANT
SCREW LOCKING STRL 4.75X25X3.5 (Screw) ×1 IMPLANT
SLING ARM IMMOBILIZER LRG (SOFTGOODS) ×2 IMPLANT
SMARTMIX MINI TOWER (MISCELLANEOUS)
SPONGE SURGIFOAM ABS GEL 100 (HEMOSTASIS) ×1 IMPLANT
STEM HUMERAL STRL 12MMX14MM (Stem) ×1 IMPLANT
SUCTION FRAZIER HANDLE 12FR (TUBING) ×2
SUCTION TUBE FRAZIER 12FR DISP (TUBING) ×1 IMPLANT
SUT BROADBAND TAPE 2PK 1.5 (SUTURE) ×3 IMPLANT
SUT ETHIBOND NAB CT1 #1 30IN (SUTURE) ×8 IMPLANT
SUT MNCRL AB 4-0 PS2 18 (SUTURE) ×2 IMPLANT
SUT VIC AB 1 CT1 27 (SUTURE) ×6
SUT VIC AB 1 CT1 27XBRD ANTBC (SUTURE) ×3 IMPLANT
SUT VIC AB 2-0 CT1 27 (SUTURE) ×4
SUT VIC AB 2-0 CT1 TAPERPNT 27 (SUTURE) ×2 IMPLANT
TOWEL OR 17X26 10 PK STRL BLUE (TOWEL DISPOSABLE) ×6 IMPLANT
TOWER SMARTMIX MINI (MISCELLANEOUS) IMPLANT
WATER STERILE IRR 1000ML POUR (IV SOLUTION) ×1 IMPLANT

## 2019-11-15 NOTE — Anesthesia Postprocedure Evaluation (Signed)
Anesthesia Post Note  Patient: [name]  Procedure(s) Performed: RIGHT TOTAL SHOULDER ARTHROPLASTY (Right Shoulder)     Patient location during evaluation: PACU Anesthesia Type: General Level of consciousness: awake and alert and oriented Pain management: pain level controlled Vital Signs Assessment: post-procedure vital signs reviewed and stable Respiratory status: spontaneous breathing, nonlabored ventilation and respiratory function stable Cardiovascular status: blood pressure returned to baseline and stable Postop Assessment: no apparent nausea or vomiting Anesthetic complications: no   No complications documented.  Last Vitals:  Vitals:   11/15/19 1430 11/15/19 1445  BP: (!) 161/94 (!) 147/100  Pulse: (!) 105 (!) 106  Resp: (!) 23 (!) 27  Temp:    SpO2: 98% 93%    Last Pain:  Vitals:   11/15/19 1445  TempSrc:   PainSc: 3                  Adelbert Gaspard A.

## 2019-11-15 NOTE — Anesthesia Procedure Notes (Signed)
Procedure Name: Intubation Date/Time: 11/15/2019 11:00 AM Performed by: Vanessa [place], CRNA Pre-anesthesia Checklist: Patient identified, Emergency Drugs available, Suction available and Patient being monitored Patient Re-evaluated:Patient Re-evaluated prior to induction Oxygen Delivery Method: Circle system utilized Preoxygenation: Pre-oxygenation with 100% oxygen Induction Type: IV induction Ventilation: Mask ventilation without difficulty Laryngoscope Size: 2 and Miller Grade View: Grade I Tube type: Oral Tube size: 7.0 mm Number of attempts: 1 Airway Equipment and Method: Stylet Placement Confirmation: ETT inserted through vocal cords under direct vision,  positive ETCO2 and breath sounds checked- equal and bilateral Secured at: 20 cm Tube secured with: Tape Dental Injury: Teeth and Oropharynx as per pre-operative assessment

## 2019-11-15 NOTE — Op Note (Signed)
NAME: [name], KRZYZANOWSKI MEDICAL RECORD MG:[id number] ACCOUNT [id number] DATE OF BIRTH:1955/04/26 FACILITY: [facility] LOCATION: [facility] PHYSICIAN:[facility] Diamantina [place], MD  OPERATIVE REPORT  DATE OF PROCEDURE:  11/15/2019  PREOPERATIVE DIAGNOSIS:  Right shoulder arthritis.  POSTOPERATIVE DIAGNOSIS:  Right shoulder arthritis.  PROCEDURE:  Right shoulder replacement using Bi[employer] components, including standard convertible baseplate with 1 central screw and 2 peripheral fixed locking screws with convertible glenoid liner, primary micro length stem, 12 x 55 with 42 head x 21 mm  height.  SURGEON:  Cammy Copa, MD  ASSISTANT:  Karenann Cai, PA.  INDICATIONS:  The patient is a 64 year old patient with end-stage right shoulder arthritis who presents for operative management after explanation of risks and benefits.  PROCEDURE IN DETAIL:  The patient was brought to the operating room where general endotracheal anesthesia was induced.  Preoperative antibiotics administered.  Timeout was called.  The patient was placed in the beach chair position with the head in  neutral position.  She did have a little bit more protraction of the shoulder on the right than the left.  Right shoulder was preoperatively prescrubbed for 3 days with benzoyl peroxide.  Today, we prescrubbed with hydrogen peroxide, followed by alcohol  and Betadine, which was then allowed to air dry, then prepped with ChloraPrep solution and draped in a sterile manner.  Ioban used to cover the operative field.  Deltopectoral incision was made.  Skin and subcutaneous tissue were sharply divided.   Cephalic vein mobilized medially.  The patient did not have biceps tendon, but the rotator interval was opened up to the base of the glenoid.  The circumflex vessels were suture ligated.  Subdeltoid and subacromial adhesions were released.  Rotator cuff,  supraspinatus and infraspinatus looked good.  There was some tendinosis of the subscap.  No  full thickness tearing was present.  Next, the axillary nerve was palpated, visualized and a vessel loop placed around it, protected at all times during the  remaining portion of the case.  Next, the subscapularis was detached from the lesser tuberosity using a 15 blade puree, tagged with 0 Vicryl sutures.  The release was performed around to the 5 o'clock position on the shoulder.  The head was then  dislocated.  The superior aspect of the humeral head was then rongeured and reaming was performed up to a size 12.  The head cut was made in 30 degrees of retroversion along the border of the rotator cuff attachment and the humeral head.  The broaching  was then performed up to a size 12.  A 12 stem fit nicely and snugly within the proximal humerus.  A cap was placed.  Attention was then directed towards the glenoid.  The patient had a small glenoid.  Bankart lesion was created from the 3 o'clock to 6  o'clock position.  Axillary nerve was protected.  Then, using preoperative patient-specific instrumentation and guide, the guide pin was placed.  Reaming was then performed.  This was about 1-2 mm of superior reaming and about 4-5 mm inferior reaming  consistent with preoperative templating.  Bleeding bone was present.  Decision at that time was for or against a convertible glenoid versus standard baseplate.  Due to the tendinosis of the subscap, decision was made to go with convertible baseplate,  which could fit within the glenoid.  Glenoid boss reaming was then performed which remained within the glenoid.  The baseplate was then placed.  Irrisept irrigation was used after the incision, as well as after  the arthrotomy.  The glenoid baseplate was  placed in what appeared to be neutral alignment.  A central compression screw was placed with excellent fixation.  Inferior and superior locking screws were placed, also with very good fixation.  The liner was then tapped into position and then locked  into place.   Attention was then directed towards the humerus.  The 18 and 21 mm trials were placed.  The 21 mm trial gave 50% inferior and posterior translation with good spring back.  The 21 was about 2 mm taller than the native humerus, but there was  significant wear and arthritis on the native humeral head.  This is the one that was chosen.  The true components were placed.  The 5 suture tapes were then placed prior to placing the stem.  The stem was placed with good position and alignment.  The  humeral head was then placed, rotated for appropriate offset for max head coverage.  Location was performed and the location was stable.  The patient's humeral head pointed toward the glenoid with the arm in neutral rotation.  The patient had internal  rotation to the torso with 90 degrees of abduction, could get her hand to the top of her head.  External rotation at 45 degrees of abduction with subscap repair was about 45 degrees.  Next, a thorough irrigation was performed with 3 L of irrigating  solution as well as IrriSept.  Subscap was then repaired using the Nice knots as well as rotator interval closure using #1 Vicryl suture with the arm in external rotation.  Thorough irrigation was performed.  IrriSept solution and vancomycin powder were  placed within the shoulder joint.  Next, thorough irrigation was again performed.  The deltopectoral interval was closed using #1 Vicryl suture, followed by inverted interrupted 0 Vicryl suture, 2-0 Vicryl suture and a 3-0 Monocryl.  Steri-Strips,  Aquacel dressing applied, along with a shoulder sling.  Vessel loop was removed from the axillary nerve prior to final closure.  Axillary nerve was palpable and intact.  Luke's assistance was required at all times during the case for retraction,  mobilization of tissue.  His assistance was a medical necessity.  VN/NUANCE  D:11/15/2019 T:11/15/2019 JOB:[id number]

## 2019-11-15 NOTE — H&P (Signed)
[name] is an 64 y.o. female.   Chief Complaint: Right shoulder pain HPI: Golden is a 64 year old patient with right shoulder pain.  She describes night pain rest pain and pain which interferes with ADLs.  She is failed conservative measures.  She has good rotator cuff strength on examination and end-stage arthritis by CT scan.  Presents now for operative management after explanation of risks and benefits.  Past Medical History:  Diagnosis Date  . Anemia   . Anginal pain (HCC)    last time   . Anxiety   . Arthritis    RHEUMATOID  . Asthma   . Bipolar 1 disorder (HCC)   . Cataracts, bilateral 07/2017  . CHF (congestive heart failure) (HCC)   . COPD (chronic obstructive pulmonary disease) (HCC)   . Coronary artery disease    reported hx of "MI";  Echo 2009 with normal LVF;  Myoview 05/2011: no ischemia  . Depression   . Diabetes mellitus without complication (HCC)   . Dyslipidemia   . Dysrhythmia    SVT  . Esophageal stricture   . Fibromyalgia   . GERD (gastroesophageal reflux disease)   . H/O hiatal hernia   . Head injury, unspecified   . Headache    migraines  . Herpes simplex infection   . History of kidney stones   . History of loop recorder    Managed by Dr. Sharrell Ku  . Hyperlipidemia   . Hypertension   . Insomnia   . Myocardial infarction [facility])    age 36  . Osteoporosis   . Pneumonia    hx  . Seizures (HCC)   . Shortness of breath   . Sleep apnea    ? neg  . Spinal stenosis of lumbar region   . Spondylolisthesis   . Status post placement of implantable loop recorder   . Supraventricular tachycardia (HCC)   . Syncope and collapse    s/p ILR; no arhythmogenic cause identified  . UTI (lower urinary tract infection)     Past Surgical History:  Procedure Laterality Date  . BACK SURGERY    . BREAST SURGERY     lumpectomy  . CARDIAC CATHETERIZATION  10/06/2011  . CATARACT EXTRACTION W/PHACO Right 07/31/2017   Procedure: CATARACT EXTRACTION PHACO  AND INTRAOCULAR LENS PLACEMENT (IOC);  Surgeon: Fabio Pierce, MD;  Location: [facility];  Service: Ophthalmology;  Laterality: Right;  CDE: 2.33  . CATARACT EXTRACTION W/PHACO Left 08/14/2017   Procedure: CATARACT EXTRACTION PHACO AND INTRAOCULAR LENS PLACEMENT (IOC);  Surgeon: Fabio Pierce, MD;  Location: [facility];  Service: Ophthalmology;  Laterality: Left;  CDE: 2.74  . CHOLECYSTECTOMY    . CYSTOSCOPY     stone  . DIAGNOSTIC LAPAROSCOPY     laparoscopic cholecystectomy  . DOPPLER ECHOCARDIOGRAPHY  2009  . EYE SURGERY    . head up tilt table testing  06/15/2007   [name]  . HEMORRHOID SURGERY    . insertion of implatable loop recorder  08/11/2007   [name]  . POSTERIOR CERVICAL FUSION/FORAMINOTOMY N/A 12/19/2013   Procedure: RIGHT C3-4.C4-5 AND C5-6 FORAMINOTOMIES;  Surgeon: Kerrin Champagne, MD;  [facility];  Service: Orthopedics;  Laterality: N/A;  . TUBAL LIGATION      Family History  Problem Relation Age of Onset  . Heart attack Father   . Cancer Father   . Mental illness Father   . Cancer Mother   . Mental illness Mother   . Heart attack Brother  stents  . Alcohol abuse Brother   . Heart disease Brother   . Drug abuse Brother   . Diabetes Brother   . Colon cancer Maternal Aunt   . Cirrhosis Brother   . Stomach cancer Neg Hx    Social History:  reports that she has never smoked. She has never used smokeless tobacco. She reports that she does not drink alcohol and does not use drugs.  Allergies:  Allergies  Allergen Reactions  . Codeine Other (See Comments)    "I will have a heart attack."  . Morphine And Related Other (See Comments)    "It will cause me to have a heart attack."  . Ambien [Zolpidem Tartrate] Nausea And Vomiting  . Metformin And Related     Nausea/vomiting/cramping from metformin  . Lyrica [Pregabalin] Swelling and Other (See Comments)    Weight gain  . Neurontin [Gabapentin] Other (See Comments)    Causes elevated LFTs      Medications Prior to Admission  Medication Sig Dispense Refill  . acyclovir ointment (ZOVIRAX) 5 % APPLY OINTMENT TO AFFECTED AREA EVERY 3 HOURS. (Patient taking differently: Apply 1 application topically daily as needed (fever blisters). ) 30 g 0  . albuterol (PROVENTIL) (2.5 MG/3ML) 0.083% nebulizer solution USE 1 VIAL IN NEBULIZER EVERY 4 HOURS AS NEEDED FOR WHEEZING. (Patient taking differently: Take 2.5 mg by nebulization every 4 (four) hours as needed for wheezing. ) 180 mL 0  . albuterol (VENTOLIN HFA) 108 (90 Base) MCG/ACT inhaler INHALE 2 PUFFS EVERY 6 HOURS AS NEEDED FOR SHORTNESS OF BREATH AND WHEEZING. (Patient taking differently: Inhale 2 puffs into the lungs every 6 (six) hours as needed for wheezing or shortness of breath. INHALE 2 PUFFS EVERY 6 HOURS AS NEEDED FOR SHORTNESS OF BREATH AND WHEEZING.) 8.5 g 1  . BAC 50-325-40 MG tablet TAKE 1 TABLET BY MOUTH EVERY 6 HOURS AS NEEDED FOR HEADACHE. (Patient taking differently: Take 1 tablet by mouth every 6 (six) hours as needed for headache. ) 20 tablet 0  . busPIRone (BUSPAR) 10 MG tablet Take 1 tablet (10 mg total) by mouth 2 (two) times daily as needed. 60 tablet 0  . carvedilol (COREG) 6.25 MG tablet TAKE 1 & 1/2 TABLETS BY MOUTH TWICE DAILY. (Patient taking differently: Take 9.375 mg by mouth 2 (two) times daily with a meal. TAKE 1 & 1/2 TABLETS BY MOUTH TWICE DAILY.) 135 tablet 1  . celecoxib (CELEBREX) 200 MG capsule TAKE 1 CAPSULE DAILY FOR 7 DAYS, THEN TAKE 1 CAPSULE 2 TIMES A DAY FOR 21 DAYS. (Patient taking differently: Take 200 mg by mouth daily. ) 49 capsule 0  . chlorzoxazone (PARAFON) 500 MG tablet TAKE (1) TABLET EVERY SIX HOURS AS NEEDED FOR MUSCLE SPASMS. 30 tablet 0  . dexlansoprazole (DEXILANT) 60 MG capsule Take 1 capsule (60 mg total) by mouth daily. 90 capsule 1  . DULoxetine (CYMBALTA) 60 MG capsule Take 1 capsule (60 mg total) by mouth at bedtime. 90 capsule 1  . empagliflozin (JARDIANCE) 25 MG TABS tablet Take 1  tablet (25 mg total) by mouth daily before breakfast. 90 tablet 3  . escitalopram (LEXAPRO) 20 MG tablet Take 1 tablet (20 mg total) by mouth daily. 30 tablet 2  . fludrocortisone (FLORINEF) 0.1 MG tablet TAKE 1 TABLET EVERY OTHER DAY. (Patient taking differently: Take 0.1 mg by mouth every other day. ) 15 tablet 3  . fluticasone (FLONASE) 50 MCG/ACT nasal spray SPRAY 2 SPRAYS IN EACH NOSTRIL ONCE DAILY. (Patient  taking differently: Place 2 sprays into both nostrils daily as needed for allergies. As needed) 16 g 0  . fluticasone furoate-vilanterol (BREO ELLIPTA) 200-25 MCG/INH AEPB Inhale 1 puff into the lungs daily. 60 each 5  . [START ON 01/23/2020] HYDROcodone-acetaminophen (NORCO) 7.5-325 MG tablet Take 1 tablet by mouth in the morning and at bedtime. 60 tablet 0  . [START ON 12/24/2019] HYDROcodone-acetaminophen (NORCO) 7.5-325 MG tablet Take 1 tablet by mouth in the morning and at bedtime. 60 tablet 0  . [START ON 11/23/2019] HYDROcodone-acetaminophen (NORCO) 7.5-325 MG tablet Take 1 tablet by mouth in the morning and at bedtime. 60 tablet 0  . isosorbide mononitrate (IMDUR) 30 MG 24 hr tablet Take 1 tablet (30 mg total) by mouth daily. 90 tablet 1  . lamoTRIgine (LAMICTAL) 150 MG tablet Take 1 tablet (150 mg total) by mouth daily. 90 tablet 1  . levETIRAcetam (KEPPRA) 500 MG tablet TAKE (1) TABLET TWICE DAILY. 180 tablet 1  . levocetirizine (XYZAL) 5 MG tablet TAKE 1 TABLET BY MOUTH EVERY MORNING. (Patient taking differently: Take 5 mg by mouth daily as needed for allergies. ) 30 tablet 11  . lidocaine (XYLOCAINE) 5 % ointment APPLY AFFECTED AREA THREE TIMES DAILY AS NEEDED FOR MILD OR MODERATE PAIN. 50 g 0  . ondansetron (ZOFRAN) 4 MG tablet TAKE 1 TABLET BY MOUTH EVERY 8 HOURS AS NEEDED FOR NAUSEA AND VOMITING. (Patient taking differently: Take 4 mg by mouth every 8 (eight) hours as needed for nausea or vomiting. TAKE 1 TABLET BY MOUTH EVERY 8 HOURS AS NEEDED FOR NAUSEA AND VOMITING.) 20 tablet 0   . oxybutynin (DITROPAN) 5 MG tablet Take 1 tablet (5 mg total) by mouth at bedtime. 90 tablet 1  . rosuvastatin (CRESTOR) 10 MG tablet Take 1 tablet (10 mg total) by mouth daily. 90 tablet 1  . valACYclovir (VALTREX) 500 MG tablet Take 1 tablet (500 mg total) by mouth 2 (two) times daily. 180 tablet 1  . Alcohol Swabs (GLOBAL ALCOHOL PREP EASE) 70 % PADS USE 1 PAD DAILY WHEN CHECKING BLOOD SUGAR. R73.03 100 each 3  . diclofenac Sodium (VOLTAREN) 1 % GEL Apply 4 g topically 4 (four) times daily. (Patient taking differently: Apply 4 g topically 4 (four) times daily as needed (pain). ) 500 g 3  . estradiol (ESTRACE) 0.1 MG/GM vaginal cream PLACE 1 APPLICATORFUL VAGINALLY AT BEDTIME. 42.5 g 0  . furosemide (LASIX) 40 MG tablet TAKE 1 TABLET ONCE DAILY AS NEEDED FOR FLUID. 90 tablet 1  . ipratropium (ATROVENT) 0.02 % nebulizer solution USE 1 VIAL IN NEBULIZER EVERY 4 HOURS AS NEEDED FOR WHEEZING. (Patient taking differently: Take 0.5 mg by nebulization every 4 (four) hours as needed for wheezing or shortness of breath. ) 150 mL 0  . Lancets (ONETOUCH DELICA PLUS LANCET33G) MISC USE TO CHECK SUGAR DAILY. 300 each 5  . nitroGLYCERIN (NITROSTAT) 0.4 MG SL tablet PLACE ONE (1) TABLET UNDER TONGUE EVERY 5 MINUTES UP TO (3) DOSES AS NEEDED FOR CHEST PAIN. (Patient taking differently: Place 0.4 mg under the tongue every 5 (five) minutes as needed for chest pain. ) 25 tablet 0  . sucralfate (CARAFATE) 1 g tablet TAKE 1 TABLET 4 TIMES A DAY WITH MEALS AND AT BEDTIME. 90 tablet 0  . traZODone (DESYREL) 150 MG tablet Take 1 tablet (150 mg total) by mouth at bedtime. 90 tablet 1    Results for orders placed or performed during the hospital encounter of 11/15/19 (from the past 48 hour(s))  Glucose, capillary     Status: None   Collection Time: 11/15/19  9:27 AM  Result Value Ref Range   Glucose-Capillary 92 70 - 99 mg/dL    Comment: Glucose reference range applies only to samples taken after fasting for at least  8 hours.  Type and screen [facility]     Status: None (Preliminary result)   Collection Time: 11/15/19  9:30 AM  Result Value Ref Range   ABO/RH(D) PENDING    Antibody Screen PENDING    Sample Expiration      11/18/2019,2359 Performed at [facility], [address] [place], [place] [address]    *Note: Due to a large number of results and/or encounters for the requested time period, some results have not been displayed. A complete set of results can be found in Results Review.   No results found.  Review of Systems  Musculoskeletal: Positive for arthralgias.  All other systems reviewed and are negative.   Blood pressure 138/74, pulse 76, temperature 98.7 F (37.1 C), temperature source Oral, resp. rate 18, height 5\' 2"  (1.575 m), weight 66 kg, SpO2 95 %. Physical Exam Vitals reviewed.  HENT:     Head: Normocephalic.     Nose: Nose normal.     Mouth/Throat:     Mouth: Mucous membranes are moist.  Eyes:     Pupils: Pupils are equal, round, and reactive to light.  Cardiovascular:     Rate and Rhythm: Normal rate.     Pulses: Normal pulses.  Pulmonary:     Effort: Pulmonary effort is normal.  Abdominal:     General: Abdomen is flat.  Skin:    General: Skin is warm.     Capillary Refill: Capillary refill takes less than 2 seconds.  Neurological:     General: No focal deficit present.     Mental Status: She is alert.  Psychiatric:        Mood and Affect: Mood normal.   Examination of the right shoulder demonstrates 45 degrees of external rotation of 15 degrees of abduction.  85 degrees of isolated abduction and 110 of forward flexion.  Rotator cuff strength is good.  Axillary nerve fires.  Assessment/Plan Impression is end-stage right shoulder arthritis.  Plan is right total shoulder replacement.  Risk benefits are discussed include not limited to infection nerve vessel damage potential need for revision surgery as well as loss of  function and stiffness.  Patient understands risk and benefits.  All questions answered  Burnard Bunting, MD 11/15/2019, 10:19 AM

## 2019-11-15 NOTE — Transfer of Care (Signed)
Immediate Anesthesia Transfer of Care Note  Patient: [name]  Procedure(s) Performed: RIGHT TOTAL SHOULDER ARTHROPLASTY (Right Shoulder)  Patient Location: PACU  Anesthesia Type:GA combined with regional for post-op pain  Level of Consciousness: awake, alert , oriented and patient cooperative  Airway & Oxygen Therapy: Patient Spontanous Breathing and Patient connected to face mask  Post-op Assessment: Report given to RN and Post -op Vital signs reviewed and stable  Post vital signs: Reviewed and stable  Last Vitals:  Vitals Value Taken Time  BP 150/84 11/15/19 1427  Temp    Pulse 103 11/15/19 1428  Resp 25 11/15/19 1428  SpO2 97 % 11/15/19 1428  Vitals shown include unvalidated device data.  Last Pain:  Vitals:   11/15/19 0922  TempSrc:   PainSc: 10-Worst pain ever      Patients Stated Pain Goal: 9 (11/15/19 4825)  Complications: No complications documented.

## 2019-11-15 NOTE — Progress Notes (Signed)
Assisted Dr. Rob Fitzgerald with right, ultrasound guided, interscalene  block. Side rails up, monitors on throughout procedure. See vital signs in flow sheet. Tolerated Procedure well. 

## 2019-11-15 NOTE — Brief Op Note (Signed)
   11/15/2019  2:15 PM  PATIENT:  [name]  64 y.o. female  PRE-OPERATIVE DIAGNOSIS:  right glenohumeral osteoarthritis  POST-OPERATIVE DIAGNOSIS:  right glenohumeral osteoarthritis  PROCEDURE:  Procedure(s): RIGHT TOTAL SHOULDER ARTHROPLASTY  SURGEON:  Surgeon(s): Cammy Copa, MD  ASSISTANT: magnant pa  ANESTHESIA:   general  EBL: 100 ml    Total I/O In: 1300 [I.V.:1000; IV Piggyback:300] Out: 100 [Blood:100]  BLOOD ADMINISTERED: none  DRAINS: none   LOCAL MEDICATIONS USED:  none  SPECIMEN:  No Specimen  COUNTS:  YES  TOURNIQUET:  * No tourniquets in log *  DICTATION: .Other Dictation: Dictation Number [id number]  PLAN OF CARE: Admit for overnight observation  PATIENT DISPOSITION:  PACU - hemodynamically stable

## 2019-11-15 NOTE — Anesthesia Procedure Notes (Signed)
Anesthesia Regional Block: Interscalene brachial plexus block   Pre-Anesthetic Checklist: ,, timeout performed, Correct Patient, Correct Site, Correct Laterality, Correct Procedure, Correct Position, site marked, Risks and benefits discussed,  Surgical consent,  Pre-op evaluation,  At surgeon's request and post-op pain management  Laterality: Right  Prep: chloraprep       Needles:  Injection technique: Single-shot  Needle Type: Echogenic Stimulator Needle     Needle Length: 9cm  Needle Gauge: 21     Additional Needles:   Procedures:, nerve stimulator,,, ultrasound used (permanent image in chart),,,,   Nerve Stimulator or Paresthesia:  Response: deltoid, 0.6 mA,   Additional Responses:   Narrative:  Start time: 11/15/2019 10:25 AM End time: 11/15/2019 10:32 AM Injection made incrementally with aspirations every 5 mL.  Performed by: Personally  Anesthesiologist: Marcene Duos, MD

## 2019-11-15 NOTE — Plan of Care (Signed)
Discussed with patient about plan of care for post-op day 0. ° ° °Will continue to monitor patient.  ° ° °SWhittemore, RN ° °

## 2019-11-16 ENCOUNTER — Telehealth: Payer: Self-pay | Admitting: Orthopedic Surgery

## 2019-11-16 ENCOUNTER — Telehealth: Payer: Self-pay | Admitting: *Deleted

## 2019-11-16 DIAGNOSIS — M19011 Primary osteoarthritis, right shoulder: Secondary | ICD-10-CM | POA: Diagnosis not present

## 2019-11-16 LAB — HEMOGLOBIN AND HEMATOCRIT, BLOOD
HCT: 33.8 % — ABNORMAL LOW (ref 36.0–46.0)
Hemoglobin: 9.1 g/dL — ABNORMAL LOW (ref 12.0–15.0)

## 2019-11-16 MED ORDER — METHOCARBAMOL 500 MG PO TABS: 500.0000 mg | ORAL_TABLET | Freq: Three times a day (TID) | ORAL | 0 refills | Status: DC | PRN

## 2019-11-16 MED ORDER — ASPIRIN 81 MG PO TBEC: 81.0000 mg | DELAYED_RELEASE_TABLET | Freq: Every day | ORAL | 0 refills | Status: DC

## 2019-11-16 MED ORDER — OXYCODONE-ACETAMINOPHEN 7.5-325 MG PO TABS: 1.0000 | ORAL_TABLET | ORAL | 0 refills | Status: DC | PRN

## 2019-11-16 NOTE — Telephone Encounter (Signed)
Patient called. She had surgery today. Says the oxycodone was dated for 8/21 instead of today. Her call back number is [phone]

## 2019-11-16 NOTE — Evaluation (Addendum)
Occupational Therapy Evaluation Patient Details Name: [name] MRN: [id number] DOB: 08-20-1955 Today's Date: 11/16/2019    History of Present Illness Patient is a 64 year old woman s/p right total shoulder replacement.   Clinical Impression   [name] is a 64 year old woman s/p shoulder replacement without functional use of right dominant upper extremity secondary to effects of surgery and interscalene block and shoulder precautions. Therapist provided education and instruction to patient in regards to exercises, precautions, positioning, donning upper extremity clothing and bathing while maintaining shoulder precautions, ice and edema management and donning/doffing sling. Patient verbalized understanding. Patient required supervision and verbal cues for safety and adherence to shoulder precautions but abel to donn upper and lower body clothing and perform toileting. Patient needed assistance to donn sling due to unfamiliarity but verbalizes understanding of correct position. Patient reports assistance of her grandchildren at discharge - reports having a shower chair for safety and limiting bathing as a precautionary measure. All education and instruction complete. No further OT needs. Patient to follow up with MD for further therapy needs.      Follow Up Recommendations  Follow surgeon's recommendation for DC plan and follow-up therapies    Equipment Recommendations  None recommended by OT    Recommendations for Other Services       Precautions / Restrictions Precautions Precautions: Shoulder Type of Shoulder Precautions: No AROM, okay for pendulums and AROM for elbow, wrist and hand. Precaution Booklet Issued: Yes (comment) Precaution Comments: Handout Required Braces or Orthoses: Sling Restrictions Weight Bearing Restrictions: Yes RUE Weight Bearing: Non weight bearing      Mobility Bed Mobility Overal bed mobility: Independent                 Transfers                 General transfer comment: Supervision for in room ambulation. Reports typically uses a cane due "to a bad hip"    Balance Overall balance assessment: Mild deficits observed, not formally tested                                         ADL either performed or assessed with clinical judgement   ADL Overall ADL's : Needs assistance/impaired Eating/Feeding: Independent   Grooming: Independent   Upper Body Bathing: Adhering to UE precautions;Cueing for safety   Lower Body Bathing: Supervison/ safety;Cueing for safety   Upper Body Dressing : Supervision/safety;Cueing for safety   Lower Body Dressing: Supervision/safety;Cueing for safety   Toilet Transfer: Supervision/safety   Toileting- Clothing Manipulation and Hygiene: Supervision/safety       Functional mobility during ADLs: Supervision/safety       Vision Baseline Vision/History: Wears glasses Wears Glasses: Reading only Patient Visual Report: No change from baseline Vision Assessment?: No apparent visual deficits     Perception     Praxis      Pertinent Vitals/Pain Pain Assessment: Faces Faces Pain Scale: Hurts little more Pain Location: R shoulder Pain Descriptors / Indicators: Grimacing;Guarding;Aching Pain Intervention(s): Limited activity within patient's tolerance;Monitored during session     Hand Dominance Right   Extremity/Trunk Assessment Upper Extremity Assessment Upper Extremity Assessment: RUE deficits/detail;LUE deficits/detail RUE Deficits / Details: Impaired by shoulder precautions, pain, decreased ROM//strength and effects of block LUE Deficits / Details: WFL ROM and strength   Lower Extremity Assessment Lower Extremity Assessment: Overall WFL for tasks  assessed   Cervical / Trunk Assessment Cervical / Trunk Assessment: Normal   Communication Communication Communication: No difficulties   Cognition Arousal/Alertness:  Awake/alert Behavior During Therapy: WFL for tasks assessed/performed Overall Cognitive Status: Within Functional Limits for tasks assessed                                     General Comments       Exercises     Shoulder Instructions Shoulder Instructions Donning/doffing shirt without moving shoulder: Patient able to independently direct caregiver;Supervision/safety Method for sponge bathing under operated UE: Patient able to independently direct caregiver Donning/doffing sling/immobilizer: Minimal assistance;Patient able to independently direct caregiver Correct positioning of sling/immobilizer: Patient able to independently direct caregiver ROM for elbow, wrist and digits of operated UE: Patient able to independently direct caregiver;Independent Sling wearing schedule (on at all times/off for ADL's): Independent;Patient able to independently direct caregiver Proper positioning of operated UE when showering: Independent;Patient able to independently direct caregiver Dressing change: Patient able to independently direct caregiver;Independent Positioning of UE while sleeping: Patient able to independently direct caregiver    Home Living Family/patient expects to be discharged to:: Private residence Living Arrangements: Other (Comment) (Will have grandchildren to assist her for the next couple of weeks.)                 Bathroom Shower/Tub: Chief Strategy Officer: Standard     Home Equipment: Production assistant, radio - single point          Prior Functioning/Environment Level of Independence: Independent                 OT Problem List: Decreased strength;Decreased range of motion;Pain;Impaired UE functional use      OT Treatment/Interventions:      OT Goals(Current goals can be found in the care plan section) Acute Rehab OT Goals Patient Stated Goal: To rehab her shoulder well OT Goal Formulation: All assessment and education complete, DC  therapy Potential to Achieve Goals: Good  OT Frequency:     Barriers to D/C:            Co-evaluation              AM-PAC OT "6 Clicks" Daily Activity     Outcome Measure Help from another person eating meals?: None Help from another person taking care of personal grooming?: None Help from another person toileting, which includes using toliet, bedpan, or urinal?: A Little Help from another person bathing (including washing, rinsing, drying)?: A Little Help from another person to put on and taking off regular upper body clothing?: A Little Help from another person to put on and taking off regular lower body clothing?: A Little 6 Click Score: 20   End of Session Nurse Communication: Patient requests pain meds  Activity Tolerance: Patient tolerated treatment well Patient left: in chair;with call bell/phone within reach;with chair alarm set  OT Visit Diagnosis: Muscle weakness (generalized) (M62.81);Pain Pain - Right/Left: Right Pain - part of body: Shoulder                Time: 1027-2536 OT Time Calculation (min): 32 min Charges:  OT General Charges $OT Visit: 1 Visit OT Evaluation $OT Eval Low Complexity: 1 Low OT Treatments $Self Care/Home Management : 8-22 mins  Juliann Olesky, OTR/L Acute Care Rehab Services  Office [phone] Pager: [phone]   Kelli Churn 11/16/2019, 9:35 AM

## 2019-11-16 NOTE — Telephone Encounter (Signed)
Please advise. Thanks.  

## 2019-11-16 NOTE — Plan of Care (Signed)
  Problem: Pain Management: Goal: Pain level will decrease with appropriate interventions Outcome: Progressing   

## 2019-11-16 NOTE — Telephone Encounter (Signed)
Checked RX.  Earliest fill date is 11/16/19

## 2019-11-16 NOTE — Telephone Encounter (Signed)
Pt just had surgery and Luke from [facility] called and said they will be giving her Percocet 7.5mg  1 every 4 hours prn #30 and she has a post op appt in 2 weeks and pt is aware to contact us if they prescribe more pain medication.

## 2019-11-16 NOTE — Telephone Encounter (Signed)
Tried calling, no answer. LMVM with details per Luke's note below.

## 2019-11-16 NOTE — Evaluation (Signed)
Physical Therapy Evaluation Patient Details Name: [name] MRN: [id number] DOB: 1955/06/02 Today's Date: 11/16/2019   History of Present Illness  Patient is a 64 year old woman s/p right total shoulder replacement.  Clinical Impression  Patient evaluated by Physical Therapy with no further acute PT needs identified. All education has been completed and the patient has no further questions.  See below for any follow-up Physical Therapy or equipment needs. PT is signing off. Thank you for this referral.     Follow Up Recommendations No PT follow up    Equipment Recommendations  None recommended by PT    Recommendations for Other Services       Precautions / Restrictions Precautions Precautions: Shoulder Type of Shoulder Precautions: No AROM, okay for pendulums and AROM for elbow, wrist and hand. Precaution Booklet Issued: Yes (comment) Precaution Comments: Handout Required Braces or Orthoses: Sling Restrictions Weight Bearing Restrictions: Yes RUE Weight Bearing: Non weight bearing      Mobility  Bed Mobility Overal bed mobility: Modified Independent             General bed mobility comments: HOB elevated  Transfers Overall transfer level: Needs assistance Equipment used: None Transfers: Sit to/from Stand Sit to Stand: Supervision;Modified independent (Device/Increase time)         General transfer comment: for safety  Ambulation/Gait Ambulation/Gait assistance: Supervision Gait Distance (Feet): 140 Feet   Gait Pattern/deviations: Step-through pattern     General Gait Details: sueprvision for safety, no LOB, cues to go slower than pt normal for incr safety  Stairs            Wheelchair Mobility    Modified Rankin (Stroke Patients Only)       Balance Overall balance assessment: Needs assistance   Sitting balance-Leahy Scale: Good       Standing balance-Leahy Scale: Good Standing balance comment: tolerates min challneges  without LOB, reaches ~ 6" outside BOS without assist, no LOB             High level balance activites: Side stepping;Direction changes;Turns;Head turns High Level Balance Comments: no LOB with above             Pertinent Vitals/Pain Pain Assessment: Faces Faces Pain Scale: Hurts whole lot Pain Location: R shoulder Pain Descriptors / Indicators: Grimacing;Guarding;Aching Pain Intervention(s): Limited activity within patient's tolerance;Monitored during session;Premedicated before session;Repositioned;Ice applied    Home Living Family/patient expects to be discharged to:: Private residence Living Arrangements: Other (Comment) (Will have grandchildren to assist her for the next couple of weeks.) Available Help at Discharge: Family;Available PRN/intermittently           Home Equipment: Shower seat;Cane - single point      Prior Function Level of Independence: Independent         Comments: using cane at times to help with sit to  stands d/t hip pain (planning R THA with Dr. Magnus Ivan)     Hand Dominance   Dominant Hand: Right    Extremity/Trunk Assessment   Upper Extremity Assessment Upper Extremity Assessment: Defer to OT evaluation RUE Deficits / Details: Impaired by shoulder precautions, pain, decreased ROM//strength and effects of block LUE Deficits / Details: WFL ROM and strength    Lower Extremity Assessment Lower Extremity Assessment: Overall WFL for tasks assessed    Cervical / Trunk Assessment Cervical / Trunk Assessment: Normal  Communication   Communication: No difficulties  Cognition Arousal/Alertness: Awake/alert Behavior During Therapy: WFL for tasks assessed/performed Overall Cognitive Status: Within Functional  Limits for tasks assessed                                        General Comments      Exercises Donning/doffing shirt without moving shoulder: Patient able to independently direct  caregiver;Supervision/safety Method for sponge bathing under operated UE: Patient able to independently direct caregiver Donning/doffing sling/immobilizer: Minimal assistance;Patient able to independently direct caregiver Correct positioning of sling/immobilizer: Patient able to independently direct caregiver ROM for elbow, wrist and digits of operated UE: Patient able to independently direct caregiver;Independent Sling wearing schedule (on at all times/off for ADL's): Independent;Patient able to independently direct caregiver Proper positioning of operated UE when showering: Independent;Patient able to independently direct caregiver Dressing change: Patient able to independently direct caregiver;Independent Positioning of UE while sleeping: Patient able to independently direct caregiver   Assessment/Plan    PT Assessment Patent does not need any further PT services  PT Problem List         PT Treatment Interventions      PT Goals (Current goals can be found in the Care Plan section)  Acute Rehab PT Goals Patient Stated Goal: To rehab her shoulder well and then get hip replaced PT Goal Formulation: All assessment and education complete, DC therapy    Frequency     Barriers to discharge        Co-evaluation               AM-PAC PT "6 Clicks" Mobility  Outcome Measure Help needed turning from your back to your side while in a flat bed without using bedrails?: None Help needed moving from lying on your back to sitting on the side of a flat bed without using bedrails?: None Help needed moving to and from a bed to a chair (including a wheelchair)?: None Help needed standing up from a chair using your arms (e.g., wheelchair or bedside chair)?: None Help needed to walk in hospital room?: None Help needed climbing 3-5 steps with a railing? : A Little 6 Click Score: 23    End of Session Equipment Utilized During Treatment: Gait belt Activity Tolerance: Patient tolerated  treatment well Patient left: in bed;with call bell/phone within reach   PT Visit Diagnosis: Other abnormalities of gait and mobility (R26.89)    Time: 1962-2297 PT Time Calculation (min) (ACUTE ONLY): 21 min   Charges:   PT Evaluation $PT Eval Low Complexity: 1 Low          Seve Monette, PT  Acute Rehab Dept ([facility]) [phone] Pager [phone]  11/16/2019   [facility] 11/16/2019, 11:35 AM

## 2019-11-18 ENCOUNTER — Encounter (HOSPITAL_COMMUNITY): Payer: Self-pay | Admitting: Orthopedic Surgery

## 2019-11-22 ENCOUNTER — Telehealth: Payer: Self-pay | Admitting: Orthopedic Surgery

## 2019-11-22 ENCOUNTER — Other Ambulatory Visit: Payer: Self-pay | Admitting: Surgical

## 2019-11-22 MED ORDER — OXYCODONE-ACETAMINOPHEN 7.5-325 MG PO TABS: 1.0000 | ORAL_TABLET | Freq: Four times a day (QID) | ORAL | 0 refills | Status: DC | PRN

## 2019-11-22 NOTE — Telephone Encounter (Signed)
Submitted RX.  Reminder that patient must let her pain management provider know that we re-prescribed pain medicine

## 2019-11-22 NOTE — Telephone Encounter (Signed)
Patient called requesting a refill of oxycodone. Please send to pharmacy on file. Patient phone number is [phone].

## 2019-11-22 NOTE — Telephone Encounter (Signed)
Pls advise.  

## 2019-11-23 ENCOUNTER — Telehealth: Payer: Self-pay

## 2019-11-23 ENCOUNTER — Telehealth: Payer: Self-pay | Admitting: Orthopedic Surgery

## 2019-11-23 NOTE — Telephone Encounter (Signed)
IC s/w patient and advised. Verbalized understanding.  

## 2019-11-23 NOTE — Telephone Encounter (Signed)
[name] with [facility] would like to know instructions for HHPT for patient. Parameters? Precautions? Please advise

## 2019-11-23 NOTE — Telephone Encounter (Signed)
Message sent to [facility] regarding this earlier today. Response pending.

## 2019-11-23 NOTE — Telephone Encounter (Signed)
[name] from [facility] called. She would like to know what restrictions she has and OT verbal orders for ROM.

## 2019-11-24 ENCOUNTER — Telehealth: Payer: Self-pay | Admitting: Orthopedic Surgery

## 2019-11-24 NOTE — Discharge Summary (Signed)
Physician Discharge Summary      Patient ID: [name] MRN: [id number] DOB/AGE: [birth year] 63 y.o.  Admit date: 11/15/2019 Discharge date: 11/16/2019  Admission Diagnoses:  Active Problems:   S/P shoulder replacement, right   Discharge Diagnoses:  Same  Surgeries: Procedure(s): RIGHT TOTAL SHOULDER ARTHROPLASTY on 11/15/2019   Consultants:   Discharged Condition: Stable  Hospital Course: [name] is an 64 y.o. 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  They were brought to the operating room on 11/15/2019 and underwent the above named procedures.  Pt awoke from anesthesia without complication and was transferred to the floor. On POD1, patient's pain was controlled.  She had no significant difficulty with ambulation.  No lightheadedness or dizziness.  Patient was discharged home on postop day 1..  Pt will f/u with Dr. August Saucer in clinic in ~2 weeks.   Antibiotics given:  Anti-infectives (From admission, onward)   Start     Dose/Rate Route Frequency Ordered Stop   11/15/19 1730  ceFAZolin (ANCEF) IVPB 1 g/50 mL premix        1 g 100 mL/hr over 30 Minutes Intravenous Every 6 hours 11/15/19 1544 11/16/19 0642   11/15/19 1259  vancomycin (VANCOCIN) powder  Status:  Discontinued          As needed 11/15/19 1259 11/15/19 1544   11/15/19 0900  ceFAZolin (ANCEF) IVPB 2g/100 mL premix        2 g 200 mL/hr over 30 Minutes Intravenous On call to O.R. 11/15/19 1696 11/15/19 1118    .  Recent vital signs:  Vitals:   11/16/19 0953 11/16/19 1359  BP: (!) 146/71 130/80  Pulse: 87 75  Resp: 16 18  Temp: 98.3 F (36.8 C) 98.9 F (37.2 C)  SpO2: 98% 97%    Recent laboratory studies:  Results for orders placed or performed during the hospital encounter of 11/15/19  Glucose, capillary  Result Value Ref Range   Glucose-Capillary 92 70 - 99 mg/dL  Glucose, capillary  Result  Value Ref Range   Glucose-Capillary 131 (H) 70 - 99 mg/dL  Hemoglobin and hematocrit, blood  Result Value Ref Range   Hemoglobin 9.1 (L) 12.0 - 15.0 g/dL   HCT 78.9 (L) 36 - 46 %  Type and screen [facility]  Result Value Ref Range   ABO/RH(D) A POS    Antibody Screen NEG    Sample Expiration      11/18/2019,2359 Performed at [facility], [address] [place], [place] [address]    *Note: Due to a large number of results and/or encounters for the requested time period, some results have not been displayed. A complete set of results can be found in Results Review.    Discharge Medications:   Allergies as of 11/16/2019      Reactions   Codeine Other (See Comments)   "I will have a heart attack."   Morphine And Related Other (See Comments)   "It will cause me to have a heart attack."   Ambien [zolpidem Tartrate] Nausea And Vomiting   Metformin And Related    Nausea/vomiting/cramping from metformin   Lyrica [pregabalin] Swelling, Other (See Comments)   Weight gain   Neurontin [gabapentin] Other (See Comments)   Causes elevated LFTs      Medication List    STOP taking these medications   HYDROcodone-acetaminophen 7.5-325 MG tablet Commonly  known as: Norco     TAKE these medications   acyclovir ointment 5 % Commonly known as: ZOVIRAX APPLY OINTMENT TO AFFECTED AREA EVERY 3 HOURS. What changed: See the new instructions.   albuterol (2.5 MG/3ML) 0.083% nebulizer solution Commonly known as: PROVENTIL USE 1 VIAL IN NEBULIZER EVERY 4 HOURS AS NEEDED FOR WHEEZING. What changed: See the new instructions.   albuterol 108 (90 Base) MCG/ACT inhaler Commonly known as: VENTOLIN HFA INHALE 2 PUFFS EVERY 6 HOURS AS NEEDED FOR SHORTNESS OF BREATH AND WHEEZING. What changed: See the new instructions.   aspirin 81 MG EC tablet Take 1 tablet (81 mg total) by mouth daily. Swallow whole.   Bac 50-325-40 MG tablet Generic drug:  butalbital-acetaminophen-caffeine TAKE 1 TABLET BY MOUTH EVERY 6 HOURS AS NEEDED FOR HEADACHE. What changed: See the new instructions.   Breo Ellipta 200-25 MCG/INH Aepb Generic drug: fluticasone furoate-vilanterol Inhale 1 puff into the lungs daily.   busPIRone 10 MG tablet Commonly known as: BUSPAR Take 1 tablet (10 mg total) by mouth 2 (two) times daily as needed.   carvedilol 6.25 MG tablet Commonly known as: COREG TAKE 1 & 1/2 TABLETS BY MOUTH TWICE DAILY. What changed:   how much to take  how to take this  when to take this   celecoxib 200 MG capsule Commonly known as: CELEBREX TAKE 1 CAPSULE DAILY FOR 7 DAYS, THEN TAKE 1 CAPSULE 2 TIMES A DAY FOR 21 DAYS. What changed: See the new instructions.   chlorzoxazone 500 MG tablet Commonly known as: PARAFON TAKE (1) TABLET EVERY SIX HOURS AS NEEDED FOR MUSCLE SPASMS.   Dexilant 60 MG capsule Generic drug: dexlansoprazole Take 1 capsule (60 mg total) by mouth daily.   diclofenac Sodium 1 % Gel Commonly known as: Voltaren Apply 4 g topically 4 (four) times daily. What changed:   when to take this  reasons to take this   DULoxetine 60 MG capsule Commonly known as: CYMBALTA Take 1 capsule (60 mg total) by mouth at bedtime.   empagliflozin 25 MG Tabs tablet Commonly known as: Jardiance Take 1 tablet (25 mg total) by mouth daily before breakfast.   escitalopram 20 MG tablet Commonly known as: LEXAPRO Take 1 tablet (20 mg total) by mouth daily.   estradiol 0.1 MG/GM vaginal cream Commonly known as: ESTRACE PLACE 1 APPLICATORFUL VAGINALLY AT BEDTIME.   fludrocortisone 0.1 MG tablet Commonly known as: FLORINEF TAKE 1 TABLET EVERY OTHER DAY.   fluticasone 50 MCG/ACT nasal spray Commonly known as: FLONASE SPRAY 2 SPRAYS IN EACH NOSTRIL ONCE DAILY. What changed: See the new instructions.   furosemide 40 MG tablet Commonly known as: LASIX TAKE 1 TABLET ONCE DAILY AS NEEDED FOR FLUID.   Global Alcohol Prep  Ease 70 % Pads USE 1 PAD DAILY WHEN CHECKING BLOOD SUGAR. R73.03   ipratropium 0.02 % nebulizer solution Commonly known as: ATROVENT USE 1 VIAL IN NEBULIZER EVERY 4 HOURS AS NEEDED FOR WHEEZING. What changed: See the new instructions.   isosorbide mononitrate 30 MG 24 hr tablet Commonly known as: IMDUR Take 1 tablet (30 mg total) by mouth daily.   lamoTRIgine 150 MG tablet Commonly known as: LAMICTAL Take 1 tablet (150 mg total) by mouth daily.   levETIRAcetam 500 MG tablet Commonly known as: KEPPRA TAKE (1) TABLET TWICE DAILY.   levocetirizine 5 MG tablet Commonly known as: XYZAL TAKE 1 TABLET BY MOUTH EVERY MORNING. What changed:   when to take this  reasons to take this  lidocaine 5 % ointment Commonly known as: XYLOCAINE APPLY AFFECTED AREA THREE TIMES DAILY AS NEEDED FOR MILD OR MODERATE PAIN.   methocarbamol 500 MG tablet Commonly known as: Robaxin Take 1 tablet (500 mg total) by mouth every 8 (eight) hours as needed for muscle spasms.   nitroGLYCERIN 0.4 MG SL tablet Commonly known as: NITROSTAT PLACE ONE (1) TABLET UNDER TONGUE EVERY 5 MINUTES UP TO (3) DOSES AS NEEDED FOR CHEST PAIN. What changed: See the new instructions.   ondansetron 4 MG tablet Commonly known as: ZOFRAN TAKE 1 TABLET BY MOUTH EVERY 8 HOURS AS NEEDED FOR NAUSEA AND VOMITING. What changed: See the new instructions.   OneTouch Delica Plus Lancet33G Misc USE TO CHECK SUGAR DAILY.   oxybutynin 5 MG tablet Commonly known as: DITROPAN Take 1 tablet (5 mg total) by mouth at bedtime.   rosuvastatin 10 MG tablet Commonly known as: CRESTOR Take 1 tablet (10 mg total) by mouth daily.   sucralfate 1 g tablet Commonly known as: CARAFATE TAKE 1 TABLET 4 TIMES A DAY WITH MEALS AND AT BEDTIME.   traZODone 150 MG tablet Commonly known as: DESYREL Take 1 tablet (150 mg total) by mouth at bedtime.   valACYclovir 500 MG tablet Commonly known as: VALTREX Take 1 tablet (500 mg total) by  mouth 2 (two) times daily.       Diagnostic Studies: No results found.  Disposition: Discharge disposition: 01-Home or Self Care       Discharge Instructions    Call MD / Call 911   Complete by: As directed    If you experience chest pain or shortness of breath, CALL 911 and be transported to the hospital emergency room.  If you develope a fever above 101 F, pus (white drainage) or increased drainage or redness at the wound, or calf pain, call your surgeon's office.   Constipation Prevention   Complete by: As directed    Drink plenty of fluids.  Prune juice may be helpful.  You may use a stool softener, such as Colace (over the counter) 100 mg twice a day.  Use MiraLax (over the counter) for constipation as needed.   Diet - low sodium heart healthy   Complete by: As directed    Discharge instructions   Complete by: As directed    You may shower, dressing is waterproof.  Do not bathe or soak the operative shoulder in a tub, pool.  Use the CPM machine 3 times a day for 60-90 mins each time.  No lifting with the operative shoulder. Continue use of the sling.  Follow-up with Dr. August Saucer in ~2 weeks on your given appointment date.  We will remove your adhesive bandage at that time.  We are working with your NP to figure out who will prescribe postop pain medication.  As soon as we hear back from her, we will send in RX for you.  Call the office at [phone] with any questions or concerns.   Increase activity slowly as tolerated   Complete by: As directed          Signed: Julieanne Cotton 11/24/2019, 11:13 AM

## 2019-11-24 NOTE — Telephone Encounter (Signed)
I discussed with Franky Macho. Called and talked with Dorene Sorrow at [facility] for AROM and PROM  No ER past 30 Ok for deltoid isometrics

## 2019-11-24 NOTE — Telephone Encounter (Signed)
Patient called requesting a return call. Patient bandage came off wound and patient wants to know if it's ok for her PCP to rebangage wound. Please call patient about this matter. Patient phone number is [phone]

## 2019-11-24 NOTE — Telephone Encounter (Signed)
IC advised.  

## 2019-11-24 NOTE — Telephone Encounter (Signed)
Yeah that's okay

## 2019-11-24 NOTE — Telephone Encounter (Signed)
Pls advise. Thanks.  

## 2019-11-30 ENCOUNTER — Ambulatory Visit (INDEPENDENT_AMBULATORY_CARE_PROVIDER_SITE_OTHER): Payer: 59

## 2019-11-30 ENCOUNTER — Ambulatory Visit (INDEPENDENT_AMBULATORY_CARE_PROVIDER_SITE_OTHER): Payer: 59 | Admitting: Orthopedic Surgery

## 2019-11-30 ENCOUNTER — Other Ambulatory Visit: Payer: Self-pay

## 2019-11-30 ENCOUNTER — Other Ambulatory Visit: Payer: Self-pay | Admitting: Nurse Practitioner

## 2019-11-30 ENCOUNTER — Other Ambulatory Visit: Payer: Self-pay | Admitting: Specialist

## 2019-11-30 DIAGNOSIS — A6004 Herpesviral vulvovaginitis: Secondary | ICD-10-CM

## 2019-11-30 DIAGNOSIS — N941 Unspecified dyspareunia: Secondary | ICD-10-CM

## 2019-11-30 DIAGNOSIS — Z96611 Presence of right artificial shoulder joint: Secondary | ICD-10-CM

## 2019-11-30 DIAGNOSIS — B009 Herpesviral infection, unspecified: Secondary | ICD-10-CM

## 2019-11-30 DIAGNOSIS — G44229 Chronic tension-type headache, not intractable: Secondary | ICD-10-CM

## 2019-11-30 MED ORDER — METHOCARBAMOL 500 MG PO TABS: 500.0000 mg | ORAL_TABLET | Freq: Three times a day (TID) | ORAL | 0 refills | Status: DC | PRN

## 2019-12-01 ENCOUNTER — Telehealth: Payer: Self-pay | Admitting: Orthopedic Surgery

## 2019-12-01 MED ORDER — METHOCARBAMOL 500 MG PO TABS: 500.0000 mg | ORAL_TABLET | Freq: Three times a day (TID) | ORAL | 0 refills | Status: DC | PRN

## 2019-12-01 NOTE — Telephone Encounter (Signed)
Passive range of motion to pain tolerance with no external rotation beyond 45 degrees.  Okay for active assisted range of motion.  No lifting with affected arm.  Continue that until her next appointment in 4 weeks and then we can begin strengthening

## 2019-12-01 NOTE — Telephone Encounter (Signed)
Can either of you advise? 

## 2019-12-01 NOTE — Telephone Encounter (Signed)
Received call from [facility] with [facility] asking if there are any new protocols after patient was seen in the office yesterday? The number to contact [facility] is [phone]

## 2019-12-02 MED ORDER — LIDOCAINE 5 % EX OINT: TOPICAL_OINTMENT | CUTANEOUS | 0 refills | Status: DC

## 2019-12-02 MED ORDER — ESTRADIOL 0.1 MG/GM VA CREA: 1.0000 | TOPICAL_CREAM | Freq: Every day | VAGINAL | 0 refills | Status: DC

## 2019-12-02 MED ORDER — ONDANSETRON HCL 4 MG PO TABS: 4.0000 mg | ORAL_TABLET | Freq: Three times a day (TID) | ORAL | 0 refills | Status: DC | PRN

## 2019-12-02 MED ORDER — ALBUTEROL SULFATE (2.5 MG/3ML) 0.083% IN NEBU: 2.5000 mg | INHALATION_SOLUTION | RESPIRATORY_TRACT | 0 refills | Status: DC | PRN

## 2019-12-02 MED ORDER — IPRATROPIUM BROMIDE 0.02 % IN SOLN: 0.5000 mg | Freq: Four times a day (QID) | RESPIRATORY_TRACT | 0 refills | Status: DC | PRN

## 2019-12-02 MED ORDER — ACYCLOVIR 5 % EX OINT: TOPICAL_OINTMENT | CUTANEOUS | 0 refills | Status: DC

## 2019-12-02 MED ORDER — ASPIRIN 81 MG PO TBEC
81.0000 mg | DELAYED_RELEASE_TABLET | Freq: Every day | ORAL | 0 refills | Status: DC
Start: 2019-12-02 — End: 2020-02-01

## 2019-12-02 MED ORDER — FLUTICASONE PROPIONATE 50 MCG/ACT NA SUSP: 1.0000 | Freq: Every day | NASAL | 0 refills | Status: DC

## 2019-12-02 MED ORDER — NITROGLYCERIN 0.4 MG SL SUBL: 0.4000 mg | SUBLINGUAL_TABLET | SUBLINGUAL | 0 refills | Status: DC | PRN

## 2019-12-02 NOTE — Telephone Encounter (Signed)
I called Mallory and advised. Also gave verbal orders for 2x week x 2 weeks. She states that patient was already doing active ROM previously and she was doing active assisted ROM. Per Franky Macho, ok for patient to continue active ROM.

## 2019-12-09 ENCOUNTER — Other Ambulatory Visit: Payer: Self-pay | Admitting: *Deleted

## 2019-12-09 DIAGNOSIS — G44229 Chronic tension-type headache, not intractable: Secondary | ICD-10-CM

## 2019-12-09 MED ORDER — BUTALBITAL-APAP-CAFFEINE 50-325-40 MG PO TABS: ORAL_TABLET | ORAL | 0 refills | Status: DC

## 2019-12-13 ENCOUNTER — Telehealth: Payer: Self-pay | Admitting: Orthopedic Surgery

## 2019-12-13 NOTE — Telephone Encounter (Signed)
FYI

## 2019-12-13 NOTE — Telephone Encounter (Signed)
[name] with [facility] called wanting to make [name] aware the pt will be discharged on 12/14/19 and the pt states she doesn't feel like she needs any out pt at this time.   [name] CB# [phone]

## 2019-12-14 ENCOUNTER — Telehealth: Payer: Self-pay | Admitting: Cardiology

## 2019-12-14 NOTE — Telephone Encounter (Signed)
Reports for the past 2 weeks, her BP has been elevated. Says she uses a digital monitor and her PT has been checking it manually in which all BP readings have still been high (190/100's). Denies dizziness, chest pain or SOB. Medications reviewed and it was discovered that patient has not been taking carvedilol twice daily everyday. Advised that she needed to take her carvedilol 6.25 mg 1&1/2 twice daily as prescribed, continue monitoring her BP and recording them to bring to her visit on 12/22/19 with Nena Polio NP. Verbalized understanding of plan.

## 2019-12-14 NOTE — Telephone Encounter (Signed)
Pt c/o BP issue:  1. What are your last 5 BP readings?  180/116 185.112 177/114 187/113  2. Are you having any other symptoms (ex. Dizziness, headache, blurred vision, passed out)? HEADACHE   3. What is your medication issue?   States that her BP has been high for 2 weeks.  States that she is taking her BP pills and its not working for her.

## 2019-12-14 NOTE — Telephone Encounter (Signed)
Got it!     Thanks =).

## 2019-12-15 NOTE — Progress Notes (Addendum)
Edema   Cardiology Office Note  Date: 12/16/2019   ID: KEALA DASARI, DOB [birth year], MRN [id number]  PCP:  Bennie Pierini, FNP  Cardiologist:  Dina Rich, MD Electrophysiologist:  None   Chief Complaint: Autonomic dysfunction.  History of Present Illness: CHASLYNN BELLEAU is a 64 y.o. female with a history of autonomic dysfunction/recurrent syncope, HTN, chest pain, being considered for shoulder surgery.  Last encounter with Dr. Wyline Mood 10/04/2019.  Patient was being followed by Dr. Ladona Ridgel for recurrent syncope and autonomic dysfunction.  Prior loop recorder showed no clear arrhythmias.  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  Has been referred to autonomic specialist at [facility].  Florinef was lowered to 0.1 mg daily due to very high blood pressures.  She was on Coreg 9.375 twice daily on schedule.  At previous visit on 9/4 changed to 0.1 mg every other day due to high blood pressures.  Since that change she has been doing well.  Her blood pressure has been noted to be elevated since PCP stopped by them earlier in the month.  She reported blood pressures 180s over 120s Several year history of chest pain.  Had a normal stress test and catheterization in 2013 admitted to [facility] 5/216 with chest pain and negative work-up.  Outpatient Lexiscan no evidence of ischemia.  2019 nuclear stress test no ischemia.  Started on Imdur by PCP with improved symptoms.  01/18/2019 admitted with chest pain.  No evidence of ischemia by EKG or enzymes.  Echo EF 60 to 65%, G1 DD.  She was continued on Florinef 0.1 mg every other day and Coreg 9.375 mg p.o. twice daily.  Doing well with symptoms.  Continuing current regimen.  No recent chest pain symptoms.  Symptoms did improve with Imdur.   Patient is here today with recent complaints of blood pressure being up over the last 2 weeks  significantly high with symptoms of nausea, vomiting, and headaches.  She has a history of autonomic dysfunction and sees Dr. Ladona Ridgel for recurrent syncope and autonomic dysfunction.  She brings with her a log of blood pressures with blood pressure systolic in ranging from 150s to 180s/190s.  States during that time she was having significant symptoms.  Today her blood pressure is 100/68.  She denies any orthostatic symptoms such as, dizziness, lightheadedness, presyncopal or syncopal episodes.  She is very emotional stating she cannot understand why her blood pressure stays up for a while and then goes down.  There is a telephone message prior to her arriving stating she was not taking her carvedilol correctly prior to visit today.  Denies any CVA or TIA-like symptoms, PND, orthopnea, bleeding, palpitations, claudication-like symptoms, DVT or PE symptoms or lower extremity edema.   Past Medical History:  Diagnosis Date  . Anemia   . Anginal pain (HCC)    last time   . Anxiety   . Arthritis    RHEUMATOID  . Asthma   . Bipolar 1 disorder (HCC)   . Cataracts, bilateral 07/2017  . CHF (congestive heart failure) (HCC)   . COPD (chronic obstructive pulmonary disease) (HCC)   . Coronary artery disease    reported hx of "MI";  Echo 2009 with normal LVF;  Myoview 05/2011: no ischemia  . Depression   . Diabetes mellitus without complication (HCC)   . Dyslipidemia   . Dysrhythmia    SVT  . Esophageal stricture   .  Fibromyalgia   . GERD (gastroesophageal reflux disease)   . H/O hiatal hernia   . Head injury, unspecified   . Headache    migraines  . Herpes simplex infection   . History of kidney stones   . History of loop recorder    Managed by Dr. Sharrell Ku  . Hyperlipidemia   . Hypertension   . Insomnia   . Myocardial infarction [facility])    age 25  . Osteoporosis   . Pneumonia    hx  . Seizures (HCC)   . Shortness of breath   . Sleep apnea    ? neg  . Spinal stenosis of lumbar region    . Spondylolisthesis   . Status post placement of implantable loop recorder   . Supraventricular tachycardia (HCC)   . Syncope and collapse    s/p ILR; no arhythmogenic cause identified  . UTI (lower urinary tract infection)     Past Surgical History:  Procedure Laterality Date  . BACK SURGERY    . BREAST SURGERY     lumpectomy  . CARDIAC CATHETERIZATION  10/06/2011  . CATARACT EXTRACTION W/PHACO Right 07/31/2017   Procedure: CATARACT EXTRACTION PHACO AND INTRAOCULAR LENS PLACEMENT (IOC);  Surgeon: Fabio Pierce, MD;  Location: [facility];  Service: Ophthalmology;  Laterality: Right;  CDE: 2.33  . CATARACT EXTRACTION W/PHACO Left 08/14/2017   Procedure: CATARACT EXTRACTION PHACO AND INTRAOCULAR LENS PLACEMENT (IOC);  Surgeon: Fabio Pierce, MD;  Location: [facility];  Service: Ophthalmology;  Laterality: Left;  CDE: 2.74  . CHOLECYSTECTOMY    . CYSTOSCOPY     stone  . DIAGNOSTIC LAPAROSCOPY     laparoscopic cholecystectomy  . DOPPLER ECHOCARDIOGRAPHY  2009  . EYE SURGERY    . head up tilt table testing  06/15/2007   [name]  . HEMORRHOID SURGERY    . insertion of implatable loop recorder  08/11/2007   [name]  . POSTERIOR CERVICAL FUSION/FORAMINOTOMY N/A 12/19/2013   Procedure: RIGHT C3-4.C4-5 AND C5-6 FORAMINOTOMIES;  Surgeon: Kerrin Champagne, MD;  Location: [facility] OR;  Service: Orthopedics;  Laterality: N/A;  . TOTAL SHOULDER ARTHROPLASTY Right 11/15/2019   Procedure: RIGHT TOTAL SHOULDER ARTHROPLASTY;  Surgeon: Cammy Copa, MD;  Location: [facility]S;  Service: Orthopedics;  Laterality: Right;  . TUBAL LIGATION      Current Outpatient Medications  Medication Sig Dispense Refill  . acyclovir ointment (ZOVIRAX) 5 % Apply topically every 3 (three) hours. 30 g 0  . albuterol (PROVENTIL) (2.5 MG/3ML) 0.083% nebulizer solution Take 3 mLs (2.5 mg total) by nebulization every 4 (four) hours as needed for wheezing or shortness of breath. 180 mL 0  . albuterol (VENTOLIN HFA) 108 (90  Base) MCG/ACT inhaler INHALE 2 PUFFS EVERY 6 HOURS AS NEEDED FOR SHORTNESS OF BREATH AND WHEEZING. (Patient taking differently: Inhale 2 puffs into the lungs every 6 (six) hours as needed for wheezing or shortness of breath. INHALE 2 PUFFS EVERY 6 HOURS AS NEEDED FOR SHORTNESS OF BREATH AND WHEEZING.) 8.5 g 1  . Alcohol Swabs (GLOBAL ALCOHOL PREP EASE) 70 % PADS USE 1 PAD DAILY WHEN CHECKING BLOOD SUGAR. R73.03 100 each 3  . aspirin 81 MG EC tablet Take 1 tablet (81 mg total) by mouth daily. Swallow whole. 14 tablet 0  . busPIRone (BUSPAR) 10 MG tablet Take 1 tablet (10 mg total) by mouth 2 (two) times daily as needed. 60 tablet 0  . butalbital-acetaminophen-caffeine (BAC) 50-325-40 MG tablet TAKE 1 TABLET BY MOUTH EVERY 6  HOURS AS NEEDED FOR HEADACHE. 20 tablet 0  . carvedilol (COREG) 6.25 MG tablet TAKE 1 & 1/2 TABLETS BY MOUTH TWICE DAILY. (Patient taking differently: Take 9.375 mg by mouth 2 (two) times daily with a meal. TAKE 1 & 1/2 TABLETS BY MOUTH TWICE DAILY.) 135 tablet 1  . celecoxib (CELEBREX) 200 MG capsule Take 1 capsule (200 mg total) by mouth daily. 90 capsule 3  . chlorzoxazone (PARAFON) 500 MG tablet TAKE (1) TABLET EVERY SIX HOURS AS NEEDED FOR MUSCLE SPASMS. 30 tablet 0  . dexlansoprazole (DEXILANT) 60 MG capsule Take 1 capsule (60 mg total) by mouth daily. 90 capsule 1  . diclofenac Sodium (VOLTAREN) 1 % GEL Apply 4 g topically 4 (four) times daily. (Patient taking differently: Apply 4 g topically 4 (four) times daily as needed (pain). ) 500 g 3  . DULoxetine (CYMBALTA) 60 MG capsule Take 1 capsule (60 mg total) by mouth at bedtime. 90 capsule 1  . empagliflozin (JARDIANCE) 25 MG TABS tablet Take 1 tablet (25 mg total) by mouth daily before breakfast. 90 tablet 3  . escitalopram (LEXAPRO) 20 MG tablet Take 1 tablet (20 mg total) by mouth daily. 30 tablet 2  . estradiol (ESTRACE) 0.1 MG/GM vaginal cream Place 1 Applicatorful vaginally at bedtime. 42.5 g 0  . fludrocortisone  (FLORINEF) 0.1 MG tablet TAKE 1 TABLET EVERY OTHER DAY. (Patient taking differently: Take 0.1 mg by mouth every other day. ) 15 tablet 3  . fluticasone (FLONASE) 50 MCG/ACT nasal spray Place 1 spray into both nostrils daily. 16 g 0  . fluticasone furoate-vilanterol (BREO ELLIPTA) 200-25 MCG/INH AEPB Inhale 1 puff into the lungs daily. 60 each 5  . furosemide (LASIX) 40 MG tablet TAKE 1 TABLET ONCE DAILY AS NEEDED FOR FLUID. 90 tablet 1  . HYDROcodone-acetaminophen (NORCO) 7.5-325 MG tablet Take 1 tablet by mouth 2 (two) times daily as needed.    [name] ipratropium (ATROVENT) 0.02 % nebulizer solution Take 2.5 mLs (0.5 mg total) by nebulization every 6 (six) hours as needed for wheezing or shortness of breath. 150 mL 0  . isosorbide mononitrate (IMDUR) 30 MG 24 hr tablet Take 1 tablet (30 mg total) by mouth daily. 90 tablet 1  . lamoTRIgine (LAMICTAL) 150 MG tablet Take 1 tablet (150 mg total) by mouth daily. 90 tablet 1  . Lancets (ONETOUCH DELICA PLUS LANCET33G) MISC USE TO CHECK SUGAR DAILY. 300 each 5  . levETIRAcetam (KEPPRA) 500 MG tablet TAKE (1) TABLET TWICE DAILY. 180 tablet 1  . levocetirizine (XYZAL) 5 MG tablet TAKE 1 TABLET BY MOUTH EVERY MORNING. (Patient taking differently: Take 5 mg by mouth daily as needed for allergies. ) 30 tablet 11  . lidocaine (XYLOCAINE) 5 % ointment APPLY AFFECTED AREA THREE TIMES DAILY AS NEEDED FOR MILD OR MODERATE PAIN. 50 g 0  . methocarbamol (ROBAXIN) 500 MG tablet Take 1 tablet (500 mg total) by mouth every 8 (eight) hours as needed for muscle spasms. 30 tablet 0  . nitrofurantoin, macrocrystal-monohydrate, (MACROBID) 100 MG capsule Take 1 capsule by mouth 2 (two) times daily.    . nitroGLYCERIN (NITROSTAT) 0.4 MG SL tablet Place 1 tablet (0.4 mg total) under the tongue every 5 (five) minutes as needed for chest pain. 25 tablet 0  . ondansetron (ZOFRAN) 4 MG tablet Take 1 tablet (4 mg total) by mouth every 8 (eight) hours as needed for nausea or vomiting. 20  tablet 0  . oxybutynin (DITROPAN) 5 MG tablet Take 1 tablet (5 mg  total) by mouth at bedtime. 90 tablet 1  . rosuvastatin (CRESTOR) 10 MG tablet Take 1 tablet (10 mg total) by mouth daily. 90 tablet 1  . sucralfate (CARAFATE) 1 g tablet TAKE 1 TABLET 4 TIMES A DAY WITH MEALS AND AT BEDTIME. 90 tablet 0  . traZODone (DESYREL) 150 MG tablet Take 1 tablet (150 mg total) by mouth at bedtime. 90 tablet 1  . valACYclovir (VALTREX) 500 MG tablet Take 1 tablet (500 mg total) by mouth 2 (two) times daily. 180 tablet 1   No current facility-administered medications for this visit.   Allergies:  Codeine, Morphine and related, Ambien [zolpidem tartrate], Metformin and related, Lyrica [pregabalin], and Neurontin [gabapentin]   Social History: The patient  reports that she has never smoked. She has never used smokeless tobacco. She reports that she does not drink alcohol and does not use drugs.   Family History: The patient's family history includes Alcohol abuse in her brother; Cancer in her father and mother; Cirrhosis in her brother; Colon cancer in her maternal aunt; Diabetes in her brother; Drug abuse in her brother; Heart attack in her brother and father; Heart disease in her brother; Mental illness in her father and mother.   ROS:  Please see the history of present illness. Otherwise, complete review of systems is positive for none.  All other systems are reviewed and negative.   Physical Exam: VS:  BP 100/68   Pulse (!) 103   Ht  (1.575 m)   Wt 143 lb 9.6 oz (65.1 kg)   SpO2 96%   BMI 26.26 kg/m , BMI Body mass index is 26.26 kg/m.  Wt Readings from Last 3 Encounters:  12/16/19 143 lb 9.6 oz (65.1 kg)  11/15/19 145 lb 6.4 oz (66 kg)  11/10/19 148 lb (67.1 kg)    General: Patient appears comfortable at rest. Neck: Supple, no elevated JVP or carotid bruits, no thyromegaly. Lungs: Clear to auscultation, nonlabored breathing at rest. Cardiac: Regular rate and rhythm, no S3 or  significant systolic murmur, no pericardial rub. Extremities: No pitting edema, distal pulses 2+. Skin: Warm and dry. Musculoskeletal: No kyphosis. Neuropsychiatric: Alert and oriented x3, affect grossly appropriate.  ECG:  EKG 04/14/2019 [facility], ED showed sinus rhythm with a rate of 90 with left ventricular hypertrophy.  Recent Labwork: 04/15/2019: B Natriuretic Peptide 21.0 09/29/2019: ALT 18; AST 24 11/08/2019: BUN 12; Creatinine, Ser 0.78; Platelets 231; Potassium 3.6; Sodium 141 11/16/2019: Hemoglobin 9.1     Component Value Date/Time   CHOL 207 (H) 06/09/2019 1526   TRIG 249 (H) 06/09/2019 1526   TRIG 233 (H) 07/24/2014 1040   HDL 48 06/09/2019 1526   HDL 47 07/24/2014 1040   CHOLHDL 4.3 06/09/2019 1526   CHOLHDL 4.2 CALC 04/22/2007 1012   VLDL 20 04/22/2007 1012   LDLCALC 116 (H) 06/09/2019 1526   LDLDIRECT 142.1 04/22/2007 1012    Other Studies Reviewed Today:  Diagnostic Studies   Echocardiogram 01/18/2019 1. Left ventricular ejection fraction, by visual estimation, is 60 to 65%. The left ventricle has normal function. There is mildly increased left ventricular hypertrophy. 2. Left ventricular diastolic Doppler parameters are consistent with impaired relaxation pattern of LV diastolic filling. 3. Global right ventricle has normal systolic function.The right ventricular size is normal. No increase in right ventricular wall thickness. 4. Left atrial size was normal. 5. Right atrial size was normal. 6. The mitral valve is normal in structure. No evidence of mitral valve regurgitation. No evidence of  mitral stenosis. 7. The tricuspid valve is normal in structure. Tricuspid valve regurgitation was not visualized by color flow Doppler. 8. The aortic valve is tricuspid Aortic valve regurgitation is mild to moderate by color flow Doppler. Structurally normal aortic valve, with no evidence of sclerosis or stenosis. 9. The pulmonic valve was not well visualized. Pulmonic  valve regurgitation is not visualized by color flow Doppler. 10. Normal pulmonary artery systolic pressure.   05/2011 Lexiscan MPI No ischemia   Jan 2009 Echo LEFT VENTRICLE: - Left ventricular size was normal. - Overall left ventricular systolic function was normal. - There were no left ventricular regional wall motion abnormalities. - Left ventricular wall thickness was normal.  AORTIC VALVE: - The aortic valve was trileaflet. - Aortic valve thickness was normal.  AORTA: - The aortic root was normal in size. - The aortic arch was normal.  MITRAL VALVE: - Mitral valve structure was normal.  Doppler interpretation(s): - There was trivial mitral valvular regurgitation.  LEFT ATRIUM: - Left atrial size was normal.  PULMONARY VEINS: - The pulmonary veins were grossly normal.  RIGHT VENTRICLE: - Right ventricular size was normal. - Right ventricular systolic function was normal. - Right ventricular wall thickness was normal.  PULMONIC VALVE: - The structure of the pulmonic valve appeared to be normal.  TRICUSPID VALVE: - The tricuspid valve structure was normal.  Doppler interpretation(s): - There was no significant tricuspid valvular regurgitation.  PULMONARY ARTERY: - The pulmonary artery was normal size.  RIGHT ATRIUM: - Right atrial size was normal.  SYSTEMIC VEINS: - The inferior vena cava was normal.  PERICARDIUM: - There was no pericardial effusion.  ---------------------------------------------------------------  SUMMARY - Overall left ventricular systolic function was normal. There were no left ventricular regional wall motion abnormalities. - The pulmonary veins were grossly normal.   09/2011 Cath Hemodynamic Findings: Central aortic pressure: 108/58  Left ventricular pressure: 107/10/14  Angiographic Findings:  Left main: No obstructive disease noted.  Left Anterior Descending Artery: Moderate to large sized vessel that  courses to the apex. There is a moderate sized diagonal branch. No obstructive disease noted.  Circumflex Artery: Large, dominant artery with moderate sized first obtuse marginal branch and left sided posterolateral branch with no disease noted.  Right Coronary Artery: Small, non-dominant vessel with no disease noted.  Left Ventricular Angiogram:LVEF=65%.  Impression:  1. No angiographic evidence of CAD  2. Normal LV systolic function  3. Non-cardiac chest pain  Recommendations: No further ischemic workup.  Complications: None. The patient tolerated the procedure well.   11/2014 echo Study Conclusions  - Left ventricle: The cavity size was normal. Systolic function was normal. The estimated ejection fraction was in the range of 55% to 60%. Wall motion was normal; there were no regional wall motion abnormalities. There was an increased relative contribution of atrial contraction to ventricular filling. Doppler parameters are consistent with abnormal left ventricular relaxation (grade 1 diastolic dysfunction). - Mitral valve: There was trivial regurgitation. - Tricuspid valve: There was trivial regurgitation. - Pulmonic valve: There was trivial regurgitation.   09/2017 nuclear stress  There was no ST segment deviation noted during stress. Nonspecific T wave flattening in aVL and V2 seen throughout study.  Defect 1: There is a medium defect of mild severity present in the mid inferior, apical inferior and apical lateral location. This appears to be due to soft tissue attenuation given normal regional wall motion. No ischemic territories.  This is a low risk study.  Nuclear stress EF: 65%.  Assessment and Plan:  1. SYNCOPE   2. Autonomic dysfunction   3. Other chest pain   4. Essential hypertension    1. SYNCOPE No recent syncopal episodes.  Continue Florinef 0.1 mg by mouth every other day as needed for hypotension.  2. Autonomic  dysfunction Sees Dr. Ladona Ridgel for autonomic dysfunction, syncopal episode.  History of implantable loop recorder showed no evidence of arrhythmias.  Continue Florinef 0.1 mg P0 every other day as needed for hypertension.  3. Other chest pain  No recent anginal or exertional symptoms.  Continue aspirin 81 mg.  Continue Imdur 30 mg daily.  Continue nitroglycerin sublingual as needed for chest pain.  4.  Hypertension Patient reports recent significant elevations in blood pressures over the last 2 weeks with nausea, vomiting, and headaches.  Blood pressure today is 100/68.  Heart rate is 103.  There is a telephone note stating the patient was not taking her carvedilol correctly.  She brings with her a log of blood pressures over the last 10 to 15 days with significant elevations in blood pressures.  Start clonidine 0.1 mg p.o. twice daily as needed for elevated blood pressures.  Continue carvedilol 9.375 p.o. twice daily.  Continue Lasix 40 mg daily.  Start taking her blood pressure daily for the next 2 weeks and come back for nursing visit in 2 weeks with blood pressure log and to have your blood pressure checked.    Medication Adjustments/Labs and Tests Ordered: Current medicines are reviewed at length with the patient today.  Concerns regarding medicines are outlined above.   Disposition: Follow-up with Dr. Wyline Mood or APP 1 month  Signed, Rennis Harding, NP 12/16/2019 10:08 AM    [facility] [address] [place], [place], [place] [address] Phone: [phone]; Fax: [phone]

## 2019-12-15 NOTE — Telephone Encounter (Signed)
Agree with your assessment and plan  J Jereline Ticer MD 

## 2019-12-16 ENCOUNTER — Ambulatory Visit (INDEPENDENT_AMBULATORY_CARE_PROVIDER_SITE_OTHER): Payer: 59 | Admitting: Family Medicine

## 2019-12-16 ENCOUNTER — Other Ambulatory Visit: Payer: Self-pay

## 2019-12-16 ENCOUNTER — Encounter: Payer: Self-pay | Admitting: Family Medicine

## 2019-12-16 VITALS — BP 100/68 | HR 103 | Ht 62.0 in | Wt 143.6 lb

## 2019-12-16 DIAGNOSIS — R55 Syncope and collapse: Secondary | ICD-10-CM

## 2019-12-16 DIAGNOSIS — G909 Disorder of the autonomic nervous system, unspecified: Secondary | ICD-10-CM

## 2019-12-16 DIAGNOSIS — R0789 Other chest pain: Secondary | ICD-10-CM | POA: Diagnosis not present

## 2019-12-16 DIAGNOSIS — I10 Essential (primary) hypertension: Secondary | ICD-10-CM

## 2019-12-16 MED ORDER — CLONIDINE HCL 0.1 MG PO TABS: 0.1000 mg | ORAL_TABLET | Freq: Two times a day (BID) | ORAL | 2 refills | Status: DC | PRN

## 2019-12-16 NOTE — Addendum Note (Signed)
Addended by: Lesle Chris on: 12/16/2019 10:27 AM   Modules accepted: Orders

## 2019-12-16 NOTE — Patient Instructions (Addendum)
Medication Instructions:   Begin Clonidine 0.1mg  twice a day as needed for elevated blood pressure.   May take if systolic reading (top number) greater than 150.    Continue all other current medications.  Labwork: none  Testing/Procedures: none  Follow-Up: 1 month   Any Other Special Instructions Will Be Listed Below (If Applicable).  Nurse BP check - due in 2 weeks.   Your physician has requested that you regularly monitor and record your blood pressure readings at home. Please use the same machine at the same time of day to check your readings and record them to bring to your nurse visit.   If you need a refill on your cardiac medications before your next appointment, please call your pharmacy.

## 2019-12-21 ENCOUNTER — Ambulatory Visit (INDEPENDENT_AMBULATORY_CARE_PROVIDER_SITE_OTHER): Payer: 59 | Admitting: Orthopaedic Surgery

## 2019-12-21 VITALS — Ht 62.0 in | Wt 143.6 lb

## 2019-12-21 DIAGNOSIS — M1612 Unilateral primary osteoarthritis, left hip: Secondary | ICD-10-CM | POA: Diagnosis not present

## 2019-12-21 NOTE — Progress Notes (Signed)
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  She is at the point where her pain is daily and it is 10 out of 10.  Her left hip pain is now detrimentally affecting her mobility, her quality of life and her actives daily living.  We have seen her for this left hip arthritis before.  We have recommended total hip arthroplasty in the past.  Today again we talked about hip replacement surgery and the risk and benefits of the surgery.  She is at the point where she does wish to proceed.  Her x-rays show severe end-stage arthritis of the left hip.  There is complete loss of the superior lateral joint space in particular osteophytes.  She ambulates with a cane.  She is worked on activity modification.  She had a shoulder replacement successfully by one of my partners earlier this year.  Examination of her left hip shows severe pain with internal and external rotation with severe pain in the groin.  She also has lateral right hip pain.  She currently denies any headache, chest pain, shortness of breath, fever, chills, nausea, vomiting.  Review of her medical records shows that she is a diabetic but reports good control and she does have blood pressure issues.  She is not a candidate for same-day surgery but I do feel that she is a candidate for total hip arthroplasty at this point.  Having tried and failed all forms conservative treatment and given the severity of her pain and her arthritic findings on plain film, we will work on getting this set up in the near future for a total hip arthroplasty which I truly feel she is a candidate of at this standpoint.  All questions and concerns were answered and addressed.  We will be in touch when we can schedule the surgery for staying overnight.

## 2019-12-22 ENCOUNTER — Ambulatory Visit: Payer: 59 | Admitting: Family Medicine

## 2019-12-23 ENCOUNTER — Encounter: Payer: Self-pay | Admitting: Orthopedic Surgery

## 2019-12-23 NOTE — Progress Notes (Signed)
Post-Op Visit Note   Patient: [name]           Date of Birth: 1956/03/23           MRN: [id number] Visit Date: 11/30/2019 PCP: Bennie Pierini, FNP   Assessment & Plan:  Chief Complaint:  Chief Complaint  Patient presents with  . Right Shoulder - Routine Post Op   Visit Diagnoses:  1. History of arthroplasty of right shoulder     Plan: Patient is 64 y.o. Female who presents s/p right total shoulder arthroplasty on 11/15/2019.  She is doing well overall.  Still notes some difficulty sleeping at night but pain progressing well and much improved overall compared with preoperatively.  Her incision is healing well without evidence of dehiscence or infection.  Excellent subscap strength.  She is currently doing home health PT.  Plan to transition to outpatient PT.  Followup in 4 weeks for clinical recheck.    Follow-Up Instructions: No follow-ups on file.   Orders:  Orders Placed This Encounter  Procedures  . XR Shoulder Right   Meds ordered this encounter  Medications  . DISCONTD: methocarbamol (ROBAXIN) 500 MG tablet    Sig: Take 1 tablet (500 mg total) by mouth every 8 (eight) hours as needed for muscle spasms.    Dispense:  30 tablet    Refill:  0    Imaging: No results found.  PMFS History: Patient Active Problem List   Diagnosis Date Noted  . Unilateral primary osteoarthritis, left hip 12/21/2019  . S/P shoulder replacement, right 11/15/2019  . Abnormal blood sugar 09/29/2019  . Overweight (BMI 25.0-29.9) 04/15/2019  . Sigmoid diverticulitis 04/14/2019  . Chronic diastolic CHF (congestive heart failure) (HCC) 04/14/2019  . Chest pain 01/17/2019  . Unstable angina (HCC) 10/20/2017  . Chronic pain 09/23/2016  . Pre-diabetes 09/02/2016  . Recurrent major depressive disorder, in partial remission (HCC) 07/14/2016  . Seizures (HCC) 07/14/2016  . GAD (generalized anxiety disorder) 07/14/2016  . Insomnia 09/05/2014  . Allergic rhinitis 09/05/2014    . Cervical spondylosis without myelopathy 12/19/2013    Class: Chronic  . Neural foraminal stenosis of cervical spine 12/19/2013  . Cervical radiculitis 09/19/2013  . Lumbosacral spondylosis without myelopathy 11/16/2012  . Postlaminectomy syndrome, lumbar region 11/16/2012  . Herpes simplex virus (HSV) infection 10/31/2008  . Hyperlipidemia with target LDL less than 100 10/31/2008  . Essential hypertension 10/31/2008  . GERD 10/31/2008  . SPINAL STENOSIS OF LUMBAR REGION 10/31/2008  . Myalgia 10/31/2008  . Osteoporosis 10/31/2008  . SPONDYLOLISTHESIS 10/31/2008  . SYNCOPE 10/31/2008  . Sleep apnea 10/31/2008   Past Medical History:  Diagnosis Date  . Anemia   . Anginal pain (HCC)    last time   . Anxiety   . Arthritis    RHEUMATOID  . Asthma   . Bipolar 1 disorder (HCC)   . Cataracts, bilateral 07/2017  . CHF (congestive heart failure) (HCC)   . COPD (chronic obstructive pulmonary disease) (HCC)   . Coronary artery disease    reported hx of "MI";  Echo 2009 with normal LVF;  Myoview 05/2011: no ischemia  . Depression   . Diabetes mellitus without complication (HCC)   . Dyslipidemia   . Dysrhythmia    SVT  . Esophageal stricture   . Fibromyalgia   . GERD (gastroesophageal reflux disease)   . H/O hiatal hernia   . Head injury, unspecified   . Headache    migraines  . Herpes simplex infection   .  History of kidney stones   . History of loop recorder    Managed by Dr. Sharrell Ku  . Hyperlipidemia   . Hypertension   . Insomnia   . Myocardial infarction [facility])    age 58  . Osteoporosis   . Pneumonia    hx  . Seizures (HCC)   . Shortness of breath   . Sleep apnea    ? neg  . Spinal stenosis of lumbar region   . Spondylolisthesis   . Status post placement of implantable loop recorder   . Supraventricular tachycardia (HCC)   . Syncope and collapse    s/p ILR; no arhythmogenic cause identified  . UTI (lower urinary tract infection)     Family History   Problem Relation Age of Onset  . Heart attack Father   . Cancer Father   . Mental illness Father   . Cancer Mother   . Mental illness Mother   . Heart attack Brother        stents  . Alcohol abuse Brother   . Heart disease Brother   . Drug abuse Brother   . Diabetes Brother   . Colon cancer Maternal Aunt   . Cirrhosis Brother   . Stomach cancer Neg Hx     Past Surgical History:  Procedure Laterality Date  . BACK SURGERY    . BREAST SURGERY     lumpectomy  . CARDIAC CATHETERIZATION  10/06/2011  . CATARACT EXTRACTION W/PHACO Right 07/31/2017   Procedure: CATARACT EXTRACTION PHACO AND INTRAOCULAR LENS PLACEMENT (IOC);  Surgeon: Fabio Pierce, MD;  Location: [facility];  Service: Ophthalmology;  Laterality: Right;  CDE: 2.33  . CATARACT EXTRACTION W/PHACO Left 08/14/2017   Procedure: CATARACT EXTRACTION PHACO AND INTRAOCULAR LENS PLACEMENT (IOC);  Surgeon: Fabio Pierce, MD;  Location: [facility];  Service: Ophthalmology;  Laterality: Left;  CDE: 2.74  . CHOLECYSTECTOMY    . CYSTOSCOPY     stone  . DIAGNOSTIC LAPAROSCOPY     laparoscopic cholecystectomy  . DOPPLER ECHOCARDIOGRAPHY  2009  . EYE SURGERY    . head up tilt table testing  06/15/2007   [name]  . HEMORRHOID SURGERY    . insertion of implatable loop recorder  08/11/2007   [name]  . POSTERIOR CERVICAL FUSION/FORAMINOTOMY N/A 12/19/2013   Procedure: RIGHT C3-4.C4-5 AND C5-6 FORAMINOTOMIES;  Surgeon: Kerrin Champagne, MD;  Location: [facility] OR;  Service: Orthopedics;  Laterality: N/A;  . TOTAL SHOULDER ARTHROPLASTY Right 11/15/2019   Procedure: RIGHT TOTAL SHOULDER ARTHROPLASTY;  Surgeon: Cammy Copa, MD;  Location: [facility];  Service: Orthopedics;  Laterality: Right;  . TUBAL LIGATION     Social History   Occupational History  . Occupation: Disability    Comment: 15 years  Tobacco Use  . Smoking status: Never Smoker  . Smokeless tobacco: Never Used  Vaping Use  . Vaping Use: Never used  Substance and Sexual  Activity  . Alcohol use: No    Alcohol/week: 0.0 standard drinks  . Drug use: No  . Sexual activity: Not Currently

## 2019-12-29 ENCOUNTER — Ambulatory Visit: Payer: 59 | Admitting: Orthopedic Surgery

## 2019-12-29 ENCOUNTER — Other Ambulatory Visit: Payer: Self-pay | Admitting: Nurse Practitioner

## 2019-12-29 ENCOUNTER — Other Ambulatory Visit: Payer: Self-pay | Admitting: Cardiology

## 2019-12-29 ENCOUNTER — Other Ambulatory Visit: Payer: Self-pay | Admitting: Family Medicine

## 2019-12-29 DIAGNOSIS — F5101 Primary insomnia: Secondary | ICD-10-CM

## 2019-12-29 DIAGNOSIS — F411 Generalized anxiety disorder: Secondary | ICD-10-CM

## 2019-12-29 DIAGNOSIS — R0789 Other chest pain: Secondary | ICD-10-CM

## 2019-12-29 DIAGNOSIS — F331 Major depressive disorder, recurrent, moderate: Secondary | ICD-10-CM

## 2019-12-29 DIAGNOSIS — K2100 Gastro-esophageal reflux disease with esophagitis, without bleeding: Secondary | ICD-10-CM

## 2019-12-29 DIAGNOSIS — E785 Hyperlipidemia, unspecified: Secondary | ICD-10-CM

## 2019-12-30 ENCOUNTER — Ambulatory Visit (INDEPENDENT_AMBULATORY_CARE_PROVIDER_SITE_OTHER): Payer: 59 | Admitting: Orthopedic Surgery

## 2019-12-30 ENCOUNTER — Ambulatory Visit: Payer: 59

## 2019-12-30 DIAGNOSIS — Z96611 Presence of right artificial shoulder joint: Secondary | ICD-10-CM

## 2019-12-31 ENCOUNTER — Encounter: Payer: Self-pay | Admitting: Orthopedic Surgery

## 2019-12-31 NOTE — Progress Notes (Signed)
Post-Op Visit Note   Patient: [name]           Date of Birth: [birth year]           MRN: [id number] Visit Date: 12/30/2019 PCP: Bennie Pierini, FNP   Assessment & Plan:  Chief Complaint:  Chief Complaint  Patient presents with  . Post-op Follow-up   Visit Diagnoses:  1. History of arthroplasty of right shoulder     Plan: Patient is a 64 year old female who presents s/p right total shoulder arthroplasty on 11/15/2019. She notes that she is doing very well. She is about 6 weeks out from procedure. She is not taking any medication for pain aside from occasional Robaxin. Denies any fevers, chills, night sweats, malaise, drainage from the incision. Incision is healing well. She notes that she has no difficulty with activities of daily living. She occasionally wakes from sleep with pain if she sleeps on her right side but overall she is progressing well. On exam she has right shoulder with 80 degrees external rotation, 120 degrees abduction, 140 degrees forward flexion. This is compared with 85 degrees of external rotation on the right. Subscap has 5 -/5 strength. She is doing home exercise program to work on range of motion and it seems to be working well for her. Do not see any specific need for physical therapy formally with how much progress she has made by herself. Plan for her to follow-up in 6 weeks for final check. She has upcoming total hip arthroplasty by Dr. Magnus Ivan in the future.  Follow-Up Instructions: No follow-ups on file.   Orders:  No orders of the defined types were placed in this encounter.  No orders of the defined types were placed in this encounter.   Imaging: No results found.  PMFS History: Patient Active Problem List   Diagnosis Date Noted  . Unilateral primary osteoarthritis, left hip 12/21/2019  . S/P shoulder replacement, right 11/15/2019  . Abnormal blood sugar 09/29/2019  . Overweight (BMI 25.0-29.9) 04/15/2019  . Sigmoid  diverticulitis 04/14/2019  . Chronic diastolic CHF (congestive heart failure) (HCC) 04/14/2019  . Chest pain 01/17/2019  . Unstable angina (HCC) 10/20/2017  . Chronic pain 09/23/2016  . Pre-diabetes 09/02/2016  . Recurrent major depressive disorder, in partial remission (HCC) 07/14/2016  . Seizures (HCC) 07/14/2016  . GAD (generalized anxiety disorder) 07/14/2016  . Insomnia 09/05/2014  . Allergic rhinitis 09/05/2014  . Cervical spondylosis without myelopathy 12/19/2013    Class: Chronic  . Neural foraminal stenosis of cervical spine 12/19/2013  . Cervical radiculitis 09/19/2013  . Lumbosacral spondylosis without myelopathy 11/16/2012  . Postlaminectomy syndrome, lumbar region 11/16/2012  . Herpes simplex virus (HSV) infection 10/31/2008  . Hyperlipidemia with target LDL less than 100 10/31/2008  . Essential hypertension 10/31/2008  . GERD 10/31/2008  . SPINAL STENOSIS OF LUMBAR REGION 10/31/2008  . Myalgia 10/31/2008  . Osteoporosis 10/31/2008  . SPONDYLOLISTHESIS 10/31/2008  . SYNCOPE 10/31/2008  . Sleep apnea 10/31/2008   Past Medical History:  Diagnosis Date  . Anemia   . Anginal pain (HCC)    last time   . Anxiety   . Arthritis    RHEUMATOID  . Asthma   . Bipolar 1 disorder (HCC)   . Cataracts, bilateral 07/2017  . CHF (congestive heart failure) (HCC)   . COPD (chronic obstructive pulmonary disease) (HCC)   . Coronary artery disease    reported hx of "MI";  Echo 2009 with normal LVF;  Myoview 05/2011: no ischemia  .  Depression   . Diabetes mellitus without complication (HCC)   . Dyslipidemia   . Dysrhythmia    SVT  . Esophageal stricture   . Fibromyalgia   . GERD (gastroesophageal reflux disease)   . H/O hiatal hernia   . Head injury, unspecified   . Headache    migraines  . Herpes simplex infection   . History of kidney stones   . History of loop recorder    Managed by Dr. Sharrell Ku  . Hyperlipidemia   . Hypertension   . Insomnia   . Myocardial  infarction [facility])    age 42  . Osteoporosis   . Pneumonia    hx  . Seizures (HCC)   . Shortness of breath   . Sleep apnea    ? neg  . Spinal stenosis of lumbar region   . Spondylolisthesis   . Status post placement of implantable loop recorder   . Supraventricular tachycardia (HCC)   . Syncope and collapse    s/p ILR; no arhythmogenic cause identified  . UTI (lower urinary tract infection)     Family History  Problem Relation Age of Onset  . Heart attack Father   . Cancer Father   . Mental illness Father   . Cancer Mother   . Mental illness Mother   . Heart attack Brother        stents  . Alcohol abuse Brother   . Heart disease Brother   . Drug abuse Brother   . Diabetes Brother   . Colon cancer Maternal Aunt   . Cirrhosis Brother   . Stomach cancer Neg Hx     Past Surgical History:  Procedure Laterality Date  . BACK SURGERY    . BREAST SURGERY     lumpectomy  . CARDIAC CATHETERIZATION  10/06/2011  . CATARACT EXTRACTION W/PHACO Right 07/31/2017   Procedure: CATARACT EXTRACTION PHACO AND INTRAOCULAR LENS PLACEMENT (IOC);  Surgeon: Fabio Pierce, MD;  Location: [facility];  Service: Ophthalmology;  Laterality: Right;  CDE: 2.33  . CATARACT EXTRACTION W/PHACO Left 08/14/2017   Procedure: CATARACT EXTRACTION PHACO AND INTRAOCULAR LENS PLACEMENT (IOC);  Surgeon: Fabio Pierce, MD;  Location: [facility];  Service: Ophthalmology;  Laterality: Left;  CDE: 2.74  . CHOLECYSTECTOMY    . CYSTOSCOPY     stone  . DIAGNOSTIC LAPAROSCOPY     laparoscopic cholecystectomy  . DOPPLER ECHOCARDIOGRAPHY  2009  . EYE SURGERY    . head up tilt table testing  06/15/2007   [name]  . HEMORRHOID SURGERY    . insertion of implatable loop recorder  08/11/2007   [name]  . POSTERIOR CERVICAL FUSION/FORAMINOTOMY N/A 12/19/2013   Procedure: RIGHT C3-4.C4-5 AND C5-6 FORAMINOTOMIES;  Surgeon: Kerrin Champagne, MD;  Location: [facility];  Service: Orthopedics;  Laterality: N/A;  . TOTAL SHOULDER  ARTHROPLASTY Right 11/15/2019   Procedure: RIGHT TOTAL SHOULDER ARTHROPLASTY;  Surgeon: Cammy Copa, MD;  Location: [facility];  Service: Orthopedics;  Laterality: Right;  . TUBAL LIGATION     Social History   Occupational History  . Occupation: Disability    Comment: 15 years  Tobacco Use  . Smoking status: Never Smoker  . Smokeless tobacco: Never Used  Vaping Use  . Vaping Use: Never used  Substance and Sexual Activity  . Alcohol use: No    Alcohol/week: 0.0 standard drinks  . Drug use: No  . Sexual activity: Not Currently

## 2020-01-02 ENCOUNTER — Ambulatory Visit: Payer: 59 | Admitting: *Deleted

## 2020-01-02 DIAGNOSIS — I10 Essential (primary) hypertension: Secondary | ICD-10-CM

## 2020-01-02 NOTE — Progress Notes (Signed)
Pt aware and says she has been taking medications and waiting 2 hours prior to taking BP - has f/u in 10/18

## 2020-01-02 NOTE — Progress Notes (Signed)
Okay blood pressure here looks okay.  However I do not know what to make of the home blood pressure monitoring record she brought in.  I am not sure her blood pressure monitor is being very accurate or she is not taking it at the right time.  I think we discussed the fact that she needed to take all of her blood pressure medications first and give it 2 to 3 hours to start working before she checks her blood pressure again to make sure she is getting accurate measurements.  As long as her blood pressure is staying within the range you got during the visit we do not need to make any changes.  Thank you

## 2020-01-02 NOTE — Progress Notes (Signed)
Pt here for repeat BP check - has not been able to tolerate clonidine as this has given her acid reflux (only took 4 doses) brought in BP log which was given to provider - denies any symptoms at this time - BP by [facility] check 132/82 HR 82

## 2020-01-03 ENCOUNTER — Telehealth: Payer: Self-pay

## 2020-01-03 DIAGNOSIS — G44229 Chronic tension-type headache, not intractable: Secondary | ICD-10-CM

## 2020-01-03 MED ORDER — BUTALBITAL-APAP-CAFFEINE 50-325-40 MG PO TABS: ORAL_TABLET | ORAL | 0 refills | Status: DC

## 2020-01-03 NOTE — Telephone Encounter (Signed)
  Prescription Request  01/03/2020  What is the name of the medication or equipment? Butalbital acetaminophen caffeine  Have you contacted your pharmacy to request a refill? (if applicable) Yes  Which pharmacy would you like this sent to? [facility], [employer] says she has been having a lot of headaches because her BP has been so high so she has been taking this medicine on a regular basis but is seeing her heart doctor and they are working on getting her BP down. Pt needs refills on this medicine.   Patient notified that their request is being sent to the clinical staff for review and that they should receive a response within 2 business days.

## 2020-01-10 ENCOUNTER — Other Ambulatory Visit: Payer: Self-pay

## 2020-01-10 ENCOUNTER — Telehealth: Payer: Self-pay

## 2020-01-10 ENCOUNTER — Ambulatory Visit (INDEPENDENT_AMBULATORY_CARE_PROVIDER_SITE_OTHER): Payer: 59 | Admitting: Nurse Practitioner

## 2020-01-10 ENCOUNTER — Encounter: Payer: Self-pay | Admitting: Nurse Practitioner

## 2020-01-10 VITALS — HR 100 | Temp 98.4°F | Ht 62.0 in | Wt 144.4 lb

## 2020-01-10 DIAGNOSIS — R300 Dysuria: Secondary | ICD-10-CM | POA: Diagnosis not present

## 2020-01-10 DIAGNOSIS — H6503 Acute serous otitis media, bilateral: Secondary | ICD-10-CM | POA: Diagnosis not present

## 2020-01-10 DIAGNOSIS — H9202 Otalgia, left ear: Secondary | ICD-10-CM | POA: Insufficient documentation

## 2020-01-10 LAB — URINALYSIS, COMPLETE
Bilirubin, UA: NEGATIVE
Ketones, UA: NEGATIVE
Leukocytes,UA: NEGATIVE
Nitrite, UA: NEGATIVE
Protein,UA: NEGATIVE
RBC, UA: NEGATIVE
Specific Gravity, UA: 1.02 (ref 1.005–1.030)
Urobilinogen, Ur: 0.2 mg/dL (ref 0.2–1.0)
pH, UA: 5 (ref 5.0–7.5)

## 2020-01-10 LAB — MICROSCOPIC EXAMINATION
Bacteria, UA: NONE SEEN
RBC, Urine: NONE SEEN /hpf (ref 0–2)

## 2020-01-10 MED ORDER — AMOXICILLIN-POT CLAVULANATE 875-125 MG PO TABS: 1.0000 | ORAL_TABLET | Freq: Two times a day (BID) | ORAL | 0 refills | Status: DC

## 2020-01-10 NOTE — Telephone Encounter (Signed)
Pt came in and saw Je for ear problem. Says she was told to get MMM to refer pt to pain clinic since we are limited here and can only give pt a certain amount of pain medicine.   Pt does not have a preference. Said she went to a pain clinic about 7 yrs ago when she had neck and back surgery and she liked them but cant remember where it was at or who she saw.

## 2020-01-10 NOTE — Assessment & Plan Note (Signed)
Patient reporting dysuria not well controlled. Urinalysis completed, labs pending. Provided education to patient with printed handouts given.  will treat patient appropriately after lab results.

## 2020-01-10 NOTE — Progress Notes (Signed)
Acute Office Visit  Subjective:    Patient ID: [name], female    DOB: 07/23/1955, 64 y.o.   MRN: [id number]  Chief Complaint  Patient presents with  . Ear Pain  . Dysuria    Otalgia  There is pain in both ears. The current episode started in the past 7 days. The problem occurs constantly. The problem has been unchanged. There has been no fever. The pain is at a severity of 5/10. Associated symptoms include headaches. Pertinent negatives include no coughing, rhinorrhea or sore throat. She has tried nothing for the symptoms.    Past Medical History:  Diagnosis Date  . Anemia   . Anginal pain (HCC)    last time   . Anxiety   . Arthritis    RHEUMATOID  . Asthma   . Bipolar 1 disorder (HCC)   . Cataracts, bilateral 07/2017  . CHF (congestive heart failure) (HCC)   . COPD (chronic obstructive pulmonary disease) (HCC)   . Coronary artery disease    reported hx of "MI";  Echo 2009 with normal LVF;  Myoview 05/2011: no ischemia  . Depression   . Diabetes mellitus without complication (HCC)   . Dyslipidemia   . Dysrhythmia    SVT  . Esophageal stricture   . Fibromyalgia   . GERD (gastroesophageal reflux disease)   . H/O hiatal hernia   . Head injury, unspecified   . Headache    migraines  . Herpes simplex infection   . History of kidney stones   . History of loop recorder    Managed by Dr. Sharrell Ku  . Hyperlipidemia   . Hypertension   . Insomnia   . Myocardial infarction [facility])    age 16  . Osteoporosis   . Pneumonia    hx  . Seizures (HCC)   . Shortness of breath   . Sleep apnea    ? neg  . Spinal stenosis of lumbar region   . Spondylolisthesis   . Status post placement of implantable loop recorder   . Supraventricular tachycardia (HCC)   . Syncope and collapse    s/p ILR; no arhythmogenic cause identified  . UTI (lower urinary tract infection)     Past Surgical History:  Procedure Laterality Date  . BACK SURGERY    . BREAST SURGERY      lumpectomy  . CARDIAC CATHETERIZATION  10/06/2011  . CATARACT EXTRACTION W/PHACO Right 07/31/2017   Procedure: CATARACT EXTRACTION PHACO AND INTRAOCULAR LENS PLACEMENT (IOC);  Surgeon: Fabio Pierce, MD;  Location: [facility];  Service: Ophthalmology;  Laterality: Right;  CDE: 2.33  . CATARACT EXTRACTION W/PHACO Left 08/14/2017   Procedure: CATARACT EXTRACTION PHACO AND INTRAOCULAR LENS PLACEMENT (IOC);  Surgeon: Fabio Pierce, MD;  Location: [facility];  Service: Ophthalmology;  Laterality: Left;  CDE: 2.74  . CHOLECYSTECTOMY    . CYSTOSCOPY     stone  . DIAGNOSTIC LAPAROSCOPY     laparoscopic cholecystectomy  . DOPPLER ECHOCARDIOGRAPHY  2009  . EYE SURGERY    . head up tilt table testing  06/15/2007   [name]  . HEMORRHOID SURGERY    . insertion of implatable loop recorder  08/11/2007   [name]  . POSTERIOR CERVICAL FUSION/FORAMINOTOMY N/A 12/19/2013   Procedure: RIGHT C3-4.C4-5 AND C5-6 FORAMINOTOMIES;  Surgeon: Kerrin Champagne, MD;  Location: [facility] OR;  Service: Orthopedics;  Laterality: N/A;  . TOTAL SHOULDER ARTHROPLASTY Right 11/15/2019   Procedure: RIGHT TOTAL SHOULDER ARTHROPLASTY;  Surgeon: August Saucer,  Corrie Mckusick, MD;  Location: [facility];  Service: Orthopedics;  Laterality: Right;  . TUBAL LIGATION      Family History  Problem Relation Age of Onset  . Heart attack Father   . Cancer Father   . Mental illness Father   . Cancer Mother   . Mental illness Mother   . Heart attack Brother        stents  . Alcohol abuse Brother   . Heart disease Brother   . Drug abuse Brother   . Diabetes Brother   . Colon cancer Maternal Aunt   . Cirrhosis Brother   . Stomach cancer Neg Hx     Social History   Socioeconomic History  . Marital status: Single    Spouse name: Not on file  . Number of children: 5  . Years of education: Not on file  . Highest education level: 5th grade  Occupational History  . Occupation: Disability    Comment: 15 years  Tobacco Use  . Smoking status: Never  Smoker  . Smokeless tobacco: Never Used  Vaping Use  . Vaping Use: Never used  Substance and Sexual Activity  . Alcohol use: No    Alcohol/week: 0.0 standard drinks  . Drug use: No  . Sexual activity: Not Currently  Other Topics Concern  . Not on file  Social History Narrative   Patient lives with Daughter   Patient is on disability.    Patient has 5th grade education.    Patient has 5 children, 24 grand-chilren, and 5 great-grand children.    Right handed    Social Determinants of Health   Financial Resource Strain:   . Difficulty of Paying Living Expenses: Not on file  Food Insecurity:   . Worried About Programme researcher, broadcasting/film/video in the Last Year: Not on file  . Ran Out of Food in the Last Year: Not on file  Transportation Needs:   . Lack of Transportation (Medical): Not on file  . Lack of Transportation (Non-Medical): Not on file  Physical Activity:   . Days of Exercise per Week: Not on file  . Minutes of Exercise per Session: Not on file  Stress:   . Feeling of Stress : Not on file  Social Connections:   . Frequency of Communication with Friends and Family: Not on file  . Frequency of Social Gatherings with Friends and Family: Not on file  . Attends Religious Services: Not on file  . Active Member of Clubs or Organizations: Not on file  . Attends Banker Meetings: Not on file  . Marital Status: Not on file  Intimate Partner Violence:   . Fear of Current or Ex-Partner: Not on file  . Emotionally Abused: Not on file  . Physically Abused: Not on file  . Sexually Abused: Not on file    Outpatient Medications Prior to Visit  Medication Sig Dispense Refill  . acyclovir ointment (ZOVIRAX) 5 % Apply topically every 3 (three) hours. 30 g 0  . albuterol (PROVENTIL) (2.5 MG/3ML) 0.083% nebulizer solution Take 3 mLs (2.5 mg total) by nebulization every 4 (four) hours as needed for wheezing or shortness of breath. 180 mL 0  . albuterol (VENTOLIN HFA) 108 (90 Base)  MCG/ACT inhaler INHALE 2 PUFFS EVERY 6 HOURS AS NEEDED FOR SHORTNESS OF BREATH AND WHEEZING. (Patient taking differently: Inhale 2 puffs into the lungs every 6 (six) hours as needed for wheezing or shortness of breath. INHALE 2 PUFFS EVERY 6 HOURS  AS NEEDED FOR SHORTNESS OF BREATH AND WHEEZING.) 8.5 g 1  . Alcohol Swabs (GLOBAL ALCOHOL PREP EASE) 70 % PADS USE 1 PAD DAILY WHEN CHECKING BLOOD SUGAR. R73.03 100 each 3  . aspirin 81 MG EC tablet Take 1 tablet (81 mg total) by mouth daily. Swallow whole. 14 tablet 0  . busPIRone (BUSPAR) 10 MG tablet TAKE 1 TABLET BY MOUTH TWICE DAILY AS NEEDED. 60 tablet 1  . butalbital-acetaminophen-caffeine (BAC) 50-325-40 MG tablet TAKE 1 TABLET BY MOUTH EVERY 6 HOURS AS NEEDED FOR HEADACHE. 20 tablet 0  . carvedilol (COREG) 6.25 MG tablet TAKE 1 1/2 TABLETS BY MOUTH TWICE DAILY. 90 tablet 3  . celecoxib (CELEBREX) 200 MG capsule Take 1 capsule (200 mg total) by mouth daily. 90 capsule 3  . chlorzoxazone (PARAFON) 500 MG tablet TAKE (1) TABLET EVERY SIX HOURS AS NEEDED FOR MUSCLE SPASMS. 30 tablet 0  . cloNIDine (CATAPRES) 0.1 MG tablet Take 1 tablet (0.1 mg total) by mouth 2 (two) times daily as needed (elevated blood pressure for systolic greater than 150). 60 tablet 2  . dexlansoprazole (DEXILANT) 60 MG capsule Take 1 capsule (60 mg total) by mouth daily. 90 capsule 1  . diclofenac Sodium (VOLTAREN) 1 % GEL Apply 4 g topically 4 (four) times daily. (Patient taking differently: Apply 4 g topically 4 (four) times daily as needed (pain). ) 500 g 3  . DULoxetine (CYMBALTA) 60 MG capsule Take 1 capsule (60 mg total) by mouth at bedtime. 90 capsule 1  . empagliflozin (JARDIANCE) 25 MG TABS tablet Take 1 tablet (25 mg total) by mouth daily before breakfast. 90 tablet 3  . escitalopram (LEXAPRO) 20 MG tablet Take 1 tablet (20 mg total) by mouth daily. 30 tablet 2  . estradiol (ESTRACE) 0.1 MG/GM vaginal cream Place 1 Applicatorful vaginally at bedtime. 42.5 g 0  .  fludrocortisone (FLORINEF) 0.1 MG tablet TAKE 1 TABLET EVERY OTHER DAY. (Patient taking differently: Take 0.1 mg by mouth every other day. ) 15 tablet 3  . fluticasone (FLONASE) 50 MCG/ACT nasal spray Place 1 spray into both nostrils daily. 16 g 0  . fluticasone furoate-vilanterol (BREO ELLIPTA) 200-25 MCG/INH AEPB Inhale 1 puff into the lungs daily. 60 each 5  . furosemide (LASIX) 40 MG tablet TAKE 1 TABLET ONCE DAILY AS NEEDED FOR FLUID. 90 tablet 1  . HYDROcodone-acetaminophen (NORCO) 7.5-325 MG tablet Take 1 tablet by mouth 2 (two) times daily as needed.    [name] ipratropium (ATROVENT) 0.02 % nebulizer solution Take 2.5 mLs (0.5 mg total) by nebulization every 6 (six) hours as needed for wheezing or shortness of breath. 150 mL 0  . isosorbide mononitrate (IMDUR) 30 MG 24 hr tablet Take 1 tablet (30 mg total) by mouth daily. 90 tablet 1  . lamoTRIgine (LAMICTAL) 150 MG tablet Take 1 tablet (150 mg total) by mouth daily. 90 tablet 1  . Lancets (ONETOUCH DELICA PLUS LANCET33G) MISC USE TO CHECK SUGAR DAILY. 300 each 5  . levETIRAcetam (KEPPRA) 500 MG tablet TAKE (1) TABLET TWICE DAILY. 180 tablet 1  . levocetirizine (XYZAL) 5 MG tablet TAKE 1 TABLET BY MOUTH EVERY MORNING. 90 tablet 1  . lidocaine (XYLOCAINE) 5 % ointment APPLY AFFECTED AREA THREE TIMES DAILY AS NEEDED FOR MILD OR MODERATE PAIN. 50 g 0  . methocarbamol (ROBAXIN) 500 MG tablet Take 1 tablet (500 mg total) by mouth every 8 (eight) hours as needed for muscle spasms. 30 tablet 0  . nitroGLYCERIN (NITROSTAT) 0.4 MG SL tablet  PLACE ONE (1) TABLET UNDER TONGUE EVERY 5 MINUTES UP TO (3) DOSES AS NEEDED FOR CHEST PAIN. 25 tablet 0  . ondansetron (ZOFRAN) 4 MG tablet TAKE 1 TABLET BY MOUTH EVERY 8 HOURS AS NEEDED FOR NAUSEA AND VOMITING. 20 tablet 0  . oxybutynin (DITROPAN) 5 MG tablet Take 1 tablet (5 mg total) by mouth at bedtime. 90 tablet 1  . rosuvastatin (CRESTOR) 10 MG tablet Take 1 tablet (10 mg total) by mouth daily. 90 tablet 1  .  sucralfate (CARAFATE) 1 g tablet TAKE 1 TABLET 4 TIMES DAILY WITH MEALS AND AT BEDTIME. 90 tablet 1  . traZODone (DESYREL) 150 MG tablet Take 1 tablet (150 mg total) by mouth at bedtime. 90 tablet 1  . valACYclovir (VALTREX) 500 MG tablet Take 1 tablet (500 mg total) by mouth 2 (two) times daily. 180 tablet 1   No facility-administered medications prior to visit.    Allergies  Allergen Reactions  . Codeine Other (See Comments)    "I will have a heart attack."  . Morphine And Related Other (See Comments)    "It will cause me to have a heart attack."  . Ambien [Zolpidem Tartrate] Nausea And Vomiting  . Metformin And Related     Nausea/vomiting/cramping from metformin  . Lyrica [Pregabalin] Swelling and Other (See Comments)    Weight gain  . Neurontin [Gabapentin] Other (See Comments)    Causes elevated LFTs     Review of Systems  HENT: Positive for ear pain. Negative for rhinorrhea and sore throat.   Respiratory: Negative for cough.   Neurological: Positive for headaches.  All other systems reviewed and are negative.      Objective:    Physical Exam Vitals reviewed.  Constitutional:      Appearance: Normal appearance.  HENT:     Head:     Comments: Inflamed eardrums with pain    Right Ear: External ear normal. There is no impacted cerumen.     Left Ear: External ear normal. There is no impacted cerumen.     Mouth/Throat:     Mouth: Mucous membranes are moist.     Pharynx: Oropharynx is clear.  Eyes:     Conjunctiva/sclera: Conjunctivae normal.  Cardiovascular:     Rate and Rhythm: Normal rate and regular rhythm.     Pulses: Normal pulses.     Heart sounds: Normal heart sounds.  Pulmonary:     Effort: Pulmonary effort is normal.     Breath sounds: Normal breath sounds.  Abdominal:     General: Bowel sounds are normal.  Musculoskeletal:        General: Tenderness present.     Cervical back: Normal range of motion.  Neurological:     Mental Status: She is alert  and oriented to person, place, and time.     Pulse 100   Temp 98.4 F (36.9 C) (Temporal)   Ht  (1.575 m)   Wt 144 lb 6.4 oz (65.5 kg)   SpO2 96%   BMI 26.41 kg/m  Wt Readings from Last 3 Encounters:  01/10/20 144 lb 6.4 oz (65.5 kg)  12/21/19 143 lb 9.6 oz (65.1 kg)  12/16/19 143 lb 9.6 oz (65.1 kg)    Health Maintenance Due  Topic Date Due  . PAP SMEAR-Modifier  10/15/2019  . INFLUENZA VACCINE  10/30/2019  . COVID-19 Vaccine (2 - Moderna 2-dose series) 11/05/2019    There are no preventive care reminders to display for this patient.  Lab Results  Component Value Date   TSH 2.560 06/15/2018   Lab Results  Component Value Date   WBC 7.7 11/08/2019   HGB 9.1 (L) 11/16/2019   HCT 33.8 (L) 11/16/2019   MCV 82.7 11/08/2019   PLT 231 11/08/2019   Lab Results  Component Value Date   NA 141 11/08/2019   K 3.6 11/08/2019   CO2 27 11/08/2019   GLUCOSE 115 (H) 11/08/2019   BUN 12 11/08/2019   CREATININE 0.78 11/08/2019   BILITOT 0.3 09/29/2019   ALKPHOS 147 (H) 09/29/2019   AST 24 09/29/2019   ALT 18 09/29/2019   PROT 7.9 09/29/2019   ALBUMIN 4.8 09/29/2019   CALCIUM 9.3 11/08/2019   ANIONGAP 10 11/08/2019   GFR 67.10 11/18/2012   Lab Results  Component Value Date   CHOL 207 (H) 06/09/2019   Lab Results  Component Value Date   HDL 48 06/09/2019   Lab Results  Component Value Date   LDLCALC 116 (H) 06/09/2019   Lab Results  Component Value Date   TRIG 249 (H) 06/09/2019   Lab Results  Component Value Date   CHOLHDL 4.3 06/09/2019   Lab Results  Component Value Date   HGBA1C 6.8 (H) 11/08/2019       Assessment & Plan:   Problem List Items Addressed This Visit      Nervous and Auditory   Non-recurrent acute serous otitis media of both ears    Patient is a 64 year old female who presents to clinic with bilateral ear pain. On assessment eardrums have fluid, erythematous ear canal patient rating pain a 5 out of 10. Patient has tried  nothing for pain this is new and ongoing in the last 5 to 6 days. Started patient on amoxicillin, advised patient to use Tylenol for headache. Follow-up with worsening or unresolved symptoms.  provided education with printed handouts given Rx sent to pharmacy.      Relevant Medications   amoxicillin-clavulanate (AUGMENTIN) 875-125 MG tablet     Other   Left ear pain   Dysuria - Primary    Patient reporting dysuria not well controlled. Urinalysis completed, labs pending. Provided education to patient with printed handouts given.  will treat patient appropriately after lab results.      Relevant Orders   Urinalysis, Complete (Completed)   Urine Culture       Meds ordered this encounter  Medications  . amoxicillin-clavulanate (AUGMENTIN) 875-125 MG tablet    Sig: Take 1 tablet by mouth 2 (two) times daily.    Dispense:  14 tablet    Refill:  0    Order Specific Question:   Supervising Provider    Answer:   Arville Care A [[id number]]     Daryll Drown, NP

## 2020-01-10 NOTE — Patient Instructions (Signed)

## 2020-01-10 NOTE — Assessment & Plan Note (Signed)
Patient is a 64 year old female who presents to clinic with bilateral ear pain. On assessment eardrums have fluid, erythematous ear canal patient rating pain a 5 out of 10. Patient has tried nothing for pain this is new and ongoing in the last 5 to 6 days. Started patient on amoxicillin, advised patient to use Tylenol for headache. Follow-up with worsening or unresolved symptoms.  provided education with printed handouts given Rx sent to pharmacy.

## 2020-01-12 LAB — URINE CULTURE

## 2020-01-13 ENCOUNTER — Other Ambulatory Visit (INDEPENDENT_AMBULATORY_CARE_PROVIDER_SITE_OTHER): Payer: 59 | Admitting: Nurse Practitioner

## 2020-01-13 ENCOUNTER — Ambulatory Visit (INDEPENDENT_AMBULATORY_CARE_PROVIDER_SITE_OTHER): Payer: 59 | Admitting: *Deleted

## 2020-01-13 ENCOUNTER — Other Ambulatory Visit: Payer: Self-pay

## 2020-01-13 ENCOUNTER — Ambulatory Visit (INDEPENDENT_AMBULATORY_CARE_PROVIDER_SITE_OTHER): Payer: 59 | Admitting: Cardiology

## 2020-01-13 ENCOUNTER — Encounter: Payer: Self-pay | Admitting: Cardiology

## 2020-01-13 VITALS — BP 138/90 | HR 94 | Ht 62.0 in | Wt 148.4 lb

## 2020-01-13 DIAGNOSIS — G909 Disorder of the autonomic nervous system, unspecified: Secondary | ICD-10-CM

## 2020-01-13 DIAGNOSIS — I10 Essential (primary) hypertension: Secondary | ICD-10-CM | POA: Diagnosis not present

## 2020-01-13 DIAGNOSIS — N390 Urinary tract infection, site not specified: Secondary | ICD-10-CM | POA: Diagnosis not present

## 2020-01-13 MED ORDER — CEFTRIAXONE SODIUM 1 G IJ SOLR
1.0000 g | Freq: Once | INTRAMUSCULAR | Status: AC
  Administered 2020-01-13: 1 g via INTRAMUSCULAR

## 2020-01-13 MED ORDER — FLUCONAZOLE 150 MG PO TABS: 150.0000 mg | ORAL_TABLET | Freq: Once | ORAL | 0 refills | Status: AC

## 2020-01-13 MED ORDER — HYDRALAZINE HCL 25 MG PO TABS: 25.0000 mg | ORAL_TABLET | Freq: Three times a day (TID) | ORAL | 1 refills | Status: DC | PRN

## 2020-01-13 NOTE — Progress Notes (Signed)
Clinical Summary [name] is a 64 y.o.female seen today for follow up of the following medical problems. This is a focused visit on history of autonomic dysfunction and labile bp's  1. Recurrent syncope/Autonomic dysfunction - followed by Dr Ladona Ridgel, according to notes prior loop recorder showed no clear arrhythmias - thought to be neurally mediated syncope secondary to vasodepression according to notes, has been started on florinef daily and midodrine prn - referred to autonomic specialist at [facility]. Issues getting arranged with transporation.    - we lowered florinef to 0.1mg  daily due to very high bp's. She is on coreg9.375mg  bid scheduled  - last visit we changed florinef to 0.1mg  every other day due to high bp's - since change has done well, no significant dizziness or syncope  2. HTN - pcp stopped valium earlier this month. Since then she reports elevating bp's - 180s/120s 180/124 201/140 197/127. HRs 57-100s - on coreg 9.375mg  bid, on florinef 0.1mg  every other day  -home bp's have done well per her report   - at 12/16/19 visit PA Vincenza Hews started prn clonidine for elevated bp - gave acid reflux, she stopped taking.     SH; has had covid vaccine moderna Past Medical History:  Diagnosis Date  . Anemia   . Anginal pain (HCC)    last time   . Anxiety   . Arthritis    RHEUMATOID  . Asthma   . Bipolar 1 disorder (HCC)   . Cataracts, bilateral 07/2017  . CHF (congestive heart failure) (HCC)   . COPD (chronic obstructive pulmonary disease) (HCC)   . Coronary artery disease    reported hx of "MI";  Echo 2009 with normal LVF;  Myoview 05/2011: no ischemia  . Depression   . Diabetes mellitus without complication (HCC)   . Dyslipidemia   . Dysrhythmia    SVT  . Esophageal stricture   . Fibromyalgia   . GERD (gastroesophageal reflux disease)   . H/O hiatal hernia   . Head injury, unspecified   . Headache    migraines  . Herpes simplex  infection   . History of kidney stones   . History of loop recorder    Managed by Dr. Sharrell Ku  . Hyperlipidemia   . Hypertension   . Insomnia   . Myocardial infarction [facility])    age 44  . Osteoporosis   . Pneumonia    hx  . Seizures (HCC)   . Shortness of breath   . Sleep apnea    ? neg  . Spinal stenosis of lumbar region   . Spondylolisthesis   . Status post placement of implantable loop recorder   . Supraventricular tachycardia (HCC)   . Syncope and collapse    s/p ILR; no arhythmogenic cause identified  . UTI (lower urinary tract infection)      Allergies  Allergen Reactions  . Codeine Other (See Comments)    "I will have a heart attack."  . Morphine And Related Other (See Comments)    "It will cause me to have a heart attack."  . Ambien [Zolpidem Tartrate] Nausea And Vomiting  . Metformin And Related     Nausea/vomiting/cramping from metformin  . Lyrica [Pregabalin] Swelling and Other (See Comments)    Weight gain  . Neurontin [Gabapentin] Other (See Comments)    Causes elevated LFTs      Current Outpatient Medications  Medication Sig Dispense Refill  . acyclovir ointment (ZOVIRAX) 5 % Apply topically  every 3 (three) hours. 30 g 0  . albuterol (PROVENTIL) (2.5 MG/3ML) 0.083% nebulizer solution Take 3 mLs (2.5 mg total) by nebulization every 4 (four) hours as needed for wheezing or shortness of breath. 180 mL 0  . albuterol (VENTOLIN HFA) 108 (90 Base) MCG/ACT inhaler INHALE 2 PUFFS EVERY 6 HOURS AS NEEDED FOR SHORTNESS OF BREATH AND WHEEZING. (Patient taking differently: Inhale 2 puffs into the lungs every 6 (six) hours as needed for wheezing or shortness of breath. INHALE 2 PUFFS EVERY 6 HOURS AS NEEDED FOR SHORTNESS OF BREATH AND WHEEZING.) 8.5 g 1  . Alcohol Swabs (GLOBAL ALCOHOL PREP EASE) 70 % PADS USE 1 PAD DAILY WHEN CHECKING BLOOD SUGAR. R73.03 100 each 3  . amoxicillin-clavulanate (AUGMENTIN) 875-125 MG tablet Take 1 tablet by mouth 2 (two) times  daily. 14 tablet 0  . aspirin 81 MG EC tablet Take 1 tablet (81 mg total) by mouth daily. Swallow whole. 14 tablet 0  . busPIRone (BUSPAR) 10 MG tablet TAKE 1 TABLET BY MOUTH TWICE DAILY AS NEEDED. 60 tablet 1  . butalbital-acetaminophen-caffeine (BAC) 50-325-40 MG tablet TAKE 1 TABLET BY MOUTH EVERY 6 HOURS AS NEEDED FOR HEADACHE. 20 tablet 0  . carvedilol (COREG) 6.25 MG tablet TAKE 1 1/2 TABLETS BY MOUTH TWICE DAILY. 90 tablet 3  . celecoxib (CELEBREX) 200 MG capsule Take 1 capsule (200 mg total) by mouth daily. 90 capsule 3  . chlorzoxazone (PARAFON) 500 MG tablet TAKE (1) TABLET EVERY SIX HOURS AS NEEDED FOR MUSCLE SPASMS. 30 tablet 0  . cloNIDine (CATAPRES) 0.1 MG tablet Take 1 tablet (0.1 mg total) by mouth 2 (two) times daily as needed (elevated blood pressure for systolic greater than 150). 60 tablet 2  . dexlansoprazole (DEXILANT) 60 MG capsule Take 1 capsule (60 mg total) by mouth daily. 90 capsule 1  . diclofenac Sodium (VOLTAREN) 1 % GEL Apply 4 g topically 4 (four) times daily. (Patient taking differently: Apply 4 g topically 4 (four) times daily as needed (pain). ) 500 g 3  . DULoxetine (CYMBALTA) 60 MG capsule Take 1 capsule (60 mg total) by mouth at bedtime. 90 capsule 1  . empagliflozin (JARDIANCE) 25 MG TABS tablet Take 1 tablet (25 mg total) by mouth daily before breakfast. 90 tablet 3  . escitalopram (LEXAPRO) 20 MG tablet Take 1 tablet (20 mg total) by mouth daily. 30 tablet 2  . estradiol (ESTRACE) 0.1 MG/GM vaginal cream Place 1 Applicatorful vaginally at bedtime. 42.5 g 0  . fludrocortisone (FLORINEF) 0.1 MG tablet TAKE 1 TABLET EVERY OTHER DAY. (Patient taking differently: Take 0.1 mg by mouth every other day. ) 15 tablet 3  . fluticasone (FLONASE) 50 MCG/ACT nasal spray Place 1 spray into both nostrils daily. 16 g 0  . fluticasone furoate-vilanterol (BREO ELLIPTA) 200-25 MCG/INH AEPB Inhale 1 puff into the lungs daily. 60 each 5  . furosemide (LASIX) 40 MG tablet TAKE 1  TABLET ONCE DAILY AS NEEDED FOR FLUID. 90 tablet 1  . HYDROcodone-acetaminophen (NORCO) 7.5-325 MG tablet Take 1 tablet by mouth 2 (two) times daily as needed.    [name] ipratropium (ATROVENT) 0.02 % nebulizer solution Take 2.5 mLs (0.5 mg total) by nebulization every 6 (six) hours as needed for wheezing or shortness of breath. 150 mL 0  . isosorbide mononitrate (IMDUR) 30 MG 24 hr tablet Take 1 tablet (30 mg total) by mouth daily. 90 tablet 1  . lamoTRIgine (LAMICTAL) 150 MG tablet Take 1 tablet (150 mg total) by mouth  daily. 90 tablet 1  . Lancets (ONETOUCH DELICA PLUS LANCET33G) MISC USE TO CHECK SUGAR DAILY. 300 each 5  . levETIRAcetam (KEPPRA) 500 MG tablet TAKE (1) TABLET TWICE DAILY. 180 tablet 1  . levocetirizine (XYZAL) 5 MG tablet TAKE 1 TABLET BY MOUTH EVERY MORNING. 90 tablet 1  . lidocaine (XYLOCAINE) 5 % ointment APPLY AFFECTED AREA THREE TIMES DAILY AS NEEDED FOR MILD OR MODERATE PAIN. 50 g 0  . methocarbamol (ROBAXIN) 500 MG tablet Take 1 tablet (500 mg total) by mouth every 8 (eight) hours as needed for muscle spasms. 30 tablet 0  . nitroGLYCERIN (NITROSTAT) 0.4 MG SL tablet PLACE ONE (1) TABLET UNDER TONGUE EVERY 5 MINUTES UP TO (3) DOSES AS NEEDED FOR CHEST PAIN. 25 tablet 0  . ondansetron (ZOFRAN) 4 MG tablet TAKE 1 TABLET BY MOUTH EVERY 8 HOURS AS NEEDED FOR NAUSEA AND VOMITING. 20 tablet 0  . oxybutynin (DITROPAN) 5 MG tablet Take 1 tablet (5 mg total) by mouth at bedtime. 90 tablet 1  . rosuvastatin (CRESTOR) 10 MG tablet Take 1 tablet (10 mg total) by mouth daily. 90 tablet 1  . sucralfate (CARAFATE) 1 g tablet TAKE 1 TABLET 4 TIMES DAILY WITH MEALS AND AT BEDTIME. 90 tablet 1  . traZODone (DESYREL) 150 MG tablet Take 1 tablet (150 mg total) by mouth at bedtime. 90 tablet 1  . valACYclovir (VALTREX) 500 MG tablet Take 1 tablet (500 mg total) by mouth 2 (two) times daily. 180 tablet 1   No current facility-administered medications for this visit.     Past Surgical History:    Procedure Laterality Date  . BACK SURGERY    . BREAST SURGERY     lumpectomy  . CARDIAC CATHETERIZATION  10/06/2011  . CATARACT EXTRACTION W/PHACO Right 07/31/2017   Procedure: CATARACT EXTRACTION PHACO AND INTRAOCULAR LENS PLACEMENT (IOC);  Surgeon: Fabio Pierce, MD;  Location: [facility];  Service: Ophthalmology;  Laterality: Right;  CDE: 2.33  . CATARACT EXTRACTION W/PHACO Left 08/14/2017   Procedure: CATARACT EXTRACTION PHACO AND INTRAOCULAR LENS PLACEMENT (IOC);  Surgeon: Fabio Pierce, MD;  Location: [facility];  Service: Ophthalmology;  Laterality: Left;  CDE: 2.74  . CHOLECYSTECTOMY    . CYSTOSCOPY     stone  . DIAGNOSTIC LAPAROSCOPY     laparoscopic cholecystectomy  . DOPPLER ECHOCARDIOGRAPHY  2009  . EYE SURGERY    . head up tilt table testing  06/15/2007   [name]  . HEMORRHOID SURGERY    . insertion of implatable loop recorder  08/11/2007   [name]  . POSTERIOR CERVICAL FUSION/FORAMINOTOMY N/A 12/19/2013   Procedure: RIGHT C3-4.C4-5 AND C5-6 FORAMINOTOMIES;  Surgeon: Kerrin Champagne, MD;  [facility];  Service: Orthopedics;  Laterality: N/A;  . TOTAL SHOULDER ARTHROPLASTY Right 11/15/2019   Procedure: RIGHT TOTAL SHOULDER ARTHROPLASTY;  Surgeon: Cammy Copa, MD;  Location: [facility];  Service: Orthopedics;  Laterality: Right;  . TUBAL LIGATION       Allergies  Allergen Reactions  . Codeine Other (See Comments)    "I will have a heart attack."  . Morphine And Related Other (See Comments)    "It will cause me to have a heart attack."  . Ambien [Zolpidem Tartrate] Nausea And Vomiting  . Metformin And Related     Nausea/vomiting/cramping from metformin  . Lyrica [Pregabalin] Swelling and Other (See Comments)    Weight gain  . Neurontin [Gabapentin] Other (See Comments)    Causes elevated LFTs  Family History  Problem Relation Age of Onset  . Heart attack Father   . Cancer Father   . Mental illness Father   . Cancer Mother   . Mental illness  Mother   . Heart attack Brother        stents  . Alcohol abuse Brother   . Heart disease Brother   . Drug abuse Brother   . Diabetes Brother   . Colon cancer Maternal Aunt   . Cirrhosis Brother   . Stomach cancer Neg Hx      Social History Ms. Cacioppo reports that she has never smoked. She has never used smokeless tobacco. Ms. Erlich reports no history of alcohol use.   Review of Systems CONSTITUTIONAL: No weight loss, fever, chills, weakness or fatigue.  HEENT: Eyes: No visual loss, blurred vision, double vision or yellow sclerae.No hearing loss, sneezing, congestion, runny nose or sore throat.  SKIN: No rash or itching.  CARDIOVASCULAR: per hpi RESPIRATORY: No shortness of breath, cough or sputum.  GASTROINTESTINAL: No anorexia, nausea, vomiting or diarrhea. No abdominal pain or blood.  GENITOURINARY: No burning on urination, no polyuria NEUROLOGICAL: No headache, dizziness, syncope, paralysis, ataxia, numbness or tingling in the extremities. No change in bowel or bladder control.  MUSCULOSKELETAL: No muscle, back pain, joint pain or stiffness.  LYMPHATICS: No enlarged nodes. No history of splenectomy.  PSYCHIATRIC: No history of depression or anxiety.  ENDOCRINOLOGIC: No reports of sweating, cold or heat intolerance. No polyuria or polydipsia.  [name]   Physical Examination Today's Vitals   01/13/20 0817  BP: 138/90  Pulse: 94  SpO2: 93%  Weight: 148 lb 6.4 oz (67.3 kg)  Height:  (1.575 m)   Body mass index is 27.14 kg/m.  Gen: resting comfortably, no acute distress HEENT: no scleral icterus, pupils equal round and reactive, no palptable cervical adenopathy,  CV: RRR, no m/r/g, no jvd Resp: Clear to auscultation bilaterally GI: abdomen is soft, non-tender, non-distended, normal bowel sounds, no hepatosplenomegaly MSK: extremities are warm, no edema.  Skin: warm, no rash Neuro:  no focal deficits Psych: appropriate affect   Diagnostic Studies 05/2011 Lexiscan  MPI No ischemia   Jan 2009 Echo LEFT VENTRICLE: - Left ventricular size was normal. - Overall left ventricular systolic function was normal. - There were no left ventricular regional wall motion abnormalities. - Left ventricular wall thickness was normal.  AORTIC VALVE: - The aortic valve was trileaflet. - Aortic valve thickness was normal.  AORTA: - The aortic root was normal in size. - The aortic arch was normal.  MITRAL VALVE: - Mitral valve structure was normal.  Doppler interpretation(s): - There was trivial mitral valvular regurgitation.  LEFT ATRIUM: - Left atrial size was normal.  PULMONARY VEINS: - The pulmonary veins were grossly normal.  RIGHT VENTRICLE: - Right ventricular size was normal. - Right ventricular systolic function was normal. - Right ventricular wall thickness was normal.  PULMONIC VALVE: - The structure of the pulmonic valve appeared to be normal.  TRICUSPID VALVE: - The tricuspid valve structure was normal.  Doppler interpretation(s): - There was no significant tricuspid valvular regurgitation.  PULMONARY ARTERY: - The pulmonary artery was normal size.  RIGHT ATRIUM: - Right atrial size was normal.  SYSTEMIC VEINS: - The inferior vena cava was normal.  PERICARDIUM: - There was no pericardial effusion.  ---------------------------------------------------------------  SUMMARY - Overall left ventricular systolic function was normal. There were no left ventricular regional wall motion abnormalities. - The pulmonary veins were grossly  normal.   09/2011 Cath Hemodynamic Findings: Central aortic pressure: 108/58  Left ventricular pressure: 107/10/14  Angiographic Findings:  Left main: No obstructive disease noted.  Left Anterior Descending Artery: Moderate to large sized vessel that courses to the apex. There is a moderate sized diagonal Jensyn Shave. No obstructive disease noted.  Circumflex Artery: Large, dominant  artery with moderate sized first obtuse marginal Charlaine Utsey and left sided posterolateral Emmah Bratcher with no disease noted.  Right Coronary Artery: Small, non-dominant vessel with no disease noted.  Left Ventricular Angiogram:LVEF=65%.  Impression:  1. No angiographic evidence of CAD  2. Normal LV systolic function  3. Non-cardiac chest pain  Recommendations: No further ischemic workup.  Complications: None. The patient tolerated the procedure well.   11/2014 echo Study Conclusions  - Left ventricle: The cavity size was normal. Systolic function was normal. The estimated ejection fraction was in the range of 55% to 60%. Wall motion was normal; there were no regional wall motion abnormalities. There was an increased relative contribution of atrial contraction to ventricular filling. Doppler parameters are consistent with abnormal left ventricular relaxation (grade 1 diastolic dysfunction). - Mitral valve: There was trivial regurgitation. - Tricuspid valve: There was trivial regurgitation. - Pulmonic valve: There was trivial regurgitation.   09/2017 nuclear stress  There was no ST segment deviation noted during stress. Nonspecific T wave flattening in aVL and V2 seen throughout study.  Defect 1: There is a medium defect of mild severity present in the mid inferior, apical inferior and apical lateral location. This appears to be due to soft tissue attenuation given normal regional wall motion. No ischemic territories.  This is a low risk study.  Nuclear stress EF: 65%.    Assessment and Plan  1. Syncope/Autonomicdysfunction -difficult balance between severe symptomatic both hypotension and HTN. Currently on florinef 0.1mg every other dayand coreg9.375mg  bid. - recent spell of very high bp's that has since resolved. We will add hydralzine  prn for potential  recurrent episodes.       Antoine Poche, M.D.,

## 2020-01-13 NOTE — Progress Notes (Signed)
Patient in today for Rocephin 1 gm injection. Given IM in right upper outer quadrant. Patient tolerated well.

## 2020-01-13 NOTE — Patient Instructions (Signed)
Medication Instructions:   Begin Hydralazine 25mg  - may take as needed up to every 8 hours for systolic (top number) 170 or greater.  Continue all other medications.    Labwork: none  Testing/Procedures: none  Follow-Up: 6 months   Any Other Special Instructions Will Be Listed Below (If Applicable).  If you need a refill on your cardiac medications before your next appointment, please call your pharmacy.

## 2020-01-16 ENCOUNTER — Telehealth: Payer: Self-pay

## 2020-01-16 ENCOUNTER — Ambulatory Visit: Payer: 59 | Admitting: Family Medicine

## 2020-01-16 ENCOUNTER — Other Ambulatory Visit: Payer: Self-pay | Admitting: Orthopaedic Surgery

## 2020-01-16 ENCOUNTER — Other Ambulatory Visit: Payer: Self-pay

## 2020-01-16 ENCOUNTER — Telehealth: Payer: Self-pay | Admitting: Cardiology

## 2020-01-16 MED ORDER — HYDROCODONE-ACETAMINOPHEN 7.5-325 MG PO TABS
1.0000 | ORAL_TABLET | Freq: Two times a day (BID) | ORAL | 0 refills | Status: DC | PRN
Start: 2020-01-16 — End: 2020-02-01

## 2020-01-16 MED ORDER — HYDRALAZINE HCL 25 MG PO TABS
25.0000 mg | ORAL_TABLET | Freq: Three times a day (TID) | ORAL | 1 refills | Status: DC | PRN
Start: 2020-01-16 — End: 2020-03-05

## 2020-01-16 NOTE — Telephone Encounter (Signed)
Pt called stating [facility] didn't received her Rx for the hydrALAZINE (APRESOLINE) 25 MG tablet [[id number]]

## 2020-01-16 NOTE — Telephone Encounter (Signed)
I did send in some more hydrocodone to use sparingly to [facility] pharmacy in the evening.  I would rather hold off on the steroid injection since we do not want to have that in the joint if surgery is hopefully within the next 4 weeks.

## 2020-01-16 NOTE — Telephone Encounter (Signed)
Please advise 

## 2020-01-16 NOTE — Telephone Encounter (Signed)
Patient called she is requesting either a injection for her left hip or a pain medication until she gets a surgery date. Call back:[phone]

## 2020-01-16 NOTE — Telephone Encounter (Signed)
I called patient and advised. 

## 2020-01-16 NOTE — Telephone Encounter (Signed)
Notified medication resent now.

## 2020-01-17 NOTE — Telephone Encounter (Signed)
Patient called in saying [facility] did not receive fax for medication

## 2020-01-17 NOTE — Telephone Encounter (Signed)
I [facility] and they advised they do have the rx but that insurance will not cover until 01/24/20. She can pay out of pocket for the rx but insurance will not authorize until next Tuesday. I called pt to advise voiced understanding and will call with any questions.

## 2020-01-23 ENCOUNTER — Telehealth: Payer: Self-pay | Admitting: Nurse Practitioner

## 2020-01-23 MED ORDER — ACYCLOVIR 5 % EX OINT: TOPICAL_OINTMENT | CUTANEOUS | 0 refills | Status: DC

## 2020-01-23 NOTE — Telephone Encounter (Signed)
Pt is requesting to have a refill on zovirax, says that she is out

## 2020-01-23 NOTE — Telephone Encounter (Signed)
Pt says when she has a bad out break she has an Rx for the 1000 mg Please advise

## 2020-01-23 NOTE — Telephone Encounter (Signed)
Zovirax refill sent in

## 2020-01-25 ENCOUNTER — Other Ambulatory Visit: Payer: Self-pay | Admitting: Physician Assistant

## 2020-01-26 ENCOUNTER — Encounter: Payer: Self-pay | Admitting: Nurse Practitioner

## 2020-01-26 ENCOUNTER — Other Ambulatory Visit: Payer: Self-pay

## 2020-01-26 ENCOUNTER — Ambulatory Visit (INDEPENDENT_AMBULATORY_CARE_PROVIDER_SITE_OTHER): Payer: 59 | Admitting: Nurse Practitioner

## 2020-01-26 VITALS — BP 130/86 | HR 78 | Temp 98.4°F | Resp 20 | Ht 62.0 in | Wt 148.0 lb

## 2020-01-26 DIAGNOSIS — Z23 Encounter for immunization: Secondary | ICD-10-CM

## 2020-01-26 DIAGNOSIS — B009 Herpesviral infection, unspecified: Secondary | ICD-10-CM

## 2020-01-26 DIAGNOSIS — K2100 Gastro-esophageal reflux disease with esophagitis, without bleeding: Secondary | ICD-10-CM

## 2020-01-26 DIAGNOSIS — K5732 Diverticulitis of large intestine without perforation or abscess without bleeding: Secondary | ICD-10-CM

## 2020-01-26 DIAGNOSIS — M810 Age-related osteoporosis without current pathological fracture: Secondary | ICD-10-CM

## 2020-01-26 DIAGNOSIS — I5032 Chronic diastolic (congestive) heart failure: Secondary | ICD-10-CM | POA: Diagnosis not present

## 2020-01-26 DIAGNOSIS — I200 Unstable angina: Secondary | ICD-10-CM

## 2020-01-26 DIAGNOSIS — R569 Unspecified convulsions: Secondary | ICD-10-CM

## 2020-01-26 DIAGNOSIS — M47817 Spondylosis without myelopathy or radiculopathy, lumbosacral region: Secondary | ICD-10-CM

## 2020-01-26 DIAGNOSIS — J309 Allergic rhinitis, unspecified: Secondary | ICD-10-CM

## 2020-01-26 DIAGNOSIS — I10 Essential (primary) hypertension: Secondary | ICD-10-CM | POA: Diagnosis not present

## 2020-01-26 DIAGNOSIS — E663 Overweight: Secondary | ICD-10-CM

## 2020-01-26 DIAGNOSIS — F411 Generalized anxiety disorder: Secondary | ICD-10-CM

## 2020-01-26 DIAGNOSIS — G4733 Obstructive sleep apnea (adult) (pediatric): Secondary | ICD-10-CM

## 2020-01-26 DIAGNOSIS — F331 Major depressive disorder, recurrent, moderate: Secondary | ICD-10-CM

## 2020-01-26 DIAGNOSIS — A6004 Herpesviral vulvovaginitis: Secondary | ICD-10-CM

## 2020-01-26 DIAGNOSIS — R0789 Other chest pain: Secondary | ICD-10-CM

## 2020-01-26 DIAGNOSIS — E785 Hyperlipidemia, unspecified: Secondary | ICD-10-CM | POA: Diagnosis not present

## 2020-01-26 DIAGNOSIS — F5101 Primary insomnia: Secondary | ICD-10-CM

## 2020-01-26 MED ORDER — BREO ELLIPTA 200-25 MCG/INH IN AEPB: 1.0000 | INHALATION_SPRAY | Freq: Every day | RESPIRATORY_TRACT | 5 refills | Status: DC

## 2020-01-26 MED ORDER — TRAZODONE HCL 150 MG PO TABS: 150.0000 mg | ORAL_TABLET | Freq: Every day | ORAL | 1 refills | Status: DC

## 2020-01-26 MED ORDER — DEXILANT 60 MG PO CPDR: 1.0000 | DELAYED_RELEASE_CAPSULE | Freq: Every day | ORAL | 1 refills | Status: DC

## 2020-01-26 MED ORDER — LEVETIRACETAM 500 MG PO TABS: ORAL_TABLET | ORAL | 1 refills | Status: DC

## 2020-01-26 MED ORDER — DULOXETINE HCL 60 MG PO CPEP: 60.0000 mg | ORAL_CAPSULE | Freq: Every day | ORAL | 1 refills | Status: DC

## 2020-01-26 MED ORDER — LAMOTRIGINE 150 MG PO TABS: 150.0000 mg | ORAL_TABLET | Freq: Every day | ORAL | 1 refills | Status: DC

## 2020-01-26 MED ORDER — ISOSORBIDE MONONITRATE ER 30 MG PO TB24: 30.0000 mg | ORAL_TABLET | Freq: Every day | ORAL | 1 refills | Status: DC

## 2020-01-26 MED ORDER — ESCITALOPRAM OXALATE 20 MG PO TABS: 20.0000 mg | ORAL_TABLET | Freq: Every day | ORAL | 2 refills | Status: DC

## 2020-01-26 MED ORDER — VALACYCLOVIR HCL 500 MG PO TABS: 500.0000 mg | ORAL_TABLET | Freq: Two times a day (BID) | ORAL | 1 refills | Status: DC

## 2020-01-26 MED ORDER — EMPAGLIFLOZIN 25 MG PO TABS: 25.0000 mg | ORAL_TABLET | Freq: Every day | ORAL | 3 refills | Status: DC

## 2020-01-26 MED ORDER — BUSPIRONE HCL 10 MG PO TABS: 10.0000 mg | ORAL_TABLET | Freq: Two times a day (BID) | ORAL | 1 refills | Status: DC | PRN

## 2020-01-26 MED ORDER — CARVEDILOL 6.25 MG PO TABS: ORAL_TABLET | ORAL | 3 refills | Status: DC

## 2020-01-26 MED ORDER — ROSUVASTATIN CALCIUM 10 MG PO TABS: 10.0000 mg | ORAL_TABLET | Freq: Every day | ORAL | 1 refills | Status: DC

## 2020-01-26 NOTE — Progress Notes (Signed)
Subjective:    Patient ID: [name], female    DOB: 09-Sep-1955, 64 y.o.   MRN: [id number]   Chief Complaint: Medical Management of Chronic Issues    HPI:  1. Hyperlipidemia with target LDL less than 100 Does not really watch diet and is not able  To do much exrcise current;y. Lab Results  Component Value Date   CHOL 207 (H) 06/09/2019   HDL 48 06/09/2019   LDLCALC 116 (H) 06/09/2019   LDLDIRECT 142.1 04/22/2007   TRIG 249 (H) 06/09/2019   CHOLHDL 4.3 06/09/2019      2. Essential hypertension Denies SOB or headaches lately. She has occasional chest pain but she has unstable angina for sevralyears. BP Readings from Last 3 Encounters:  01/26/20 130/86  01/13/20 138/90  12/16/19 100/68     3. Chronic diastolic CHF (congestive heart failure) (HCC) Is doing well, no increasing SOB or edema.  4. Unstable angina (HCC) Has at least 2 x a month  5. Obstructive sleep apnea syndrome She never got a cpap machine. Says she is sleeping well.  6. Gastroesophageal reflux disease with esophagitis without hemorrhage Is on dexilant and it is working well for her  7. Sigmoid diverticulitis No recent flare ups  8. Lumbosacral spondylosis without myelopathy Sees pain management  9. Age-related osteoporosis without current pathological fracture Cannot do weight bearing exercises due to hip pain. She is having a left total hip replacement next week.  10. GAD (generalized anxiety disorder) Stay anxious but is able to handle it. GAD 7 : Generalized Anxiety Score 01/26/2020 11/10/2019 09/29/2019 08/08/2019  Nervous, Anxious, on Edge 0 _0 Control/stop worrying 0 _1 Worry too much - different things 0 _2 Trouble relaxing _3 Restless 0 0 0 3  Easily annoyed or irritable 0 2 0 0  Afraid - awful might happen 0 0 1 0  Total GAD 7 Score _4 Anxiety Difficulty Not difficult at all Not difficult at all Somewhat difficult Not difficult at all      11.  Primary insomnia Not sleep well due  To pain form right total shoulder replacement  12. Seizures ([place]) No recent seizure activity  13. Overweight (BMI 25.0-29.9) No weight changes  Wt Readings from Last 3 Encounters:  01/26/20 148 lb (67.1 kg)  01/13/20 148 lb 6.4 oz (67.3 kg)  01/10/20 144 lb 6.4 oz (65.5 kg)   BMI Readings from Last 3 Encounters:  01/26/20 27.07 kg/m  01/13/20 27.14 kg/m  01/10/20 26.41 kg/m      Outpatient Encounter Medications as of 01/26/2020  Medication Sig   acidophilus (RISAQUAD) CAPS capsule Take 1 capsule by mouth daily.   acyclovir ointment (ZOVIRAX) 5 % Apply topically every 3 (three) hours. (Patient taking differently: Apply 1 application topically daily as needed (herpes outbreak). )   albuterol (PROVENTIL) (2.5 MG/3ML) 0.083% nebulizer solution Take 3 mLs (2.5 mg total) by nebulization every 4 (four) hours as needed for wheezing or shortness of breath.   albuterol (VENTOLIN HFA) 108 (90 Base) MCG/ACT inhaler INHALE 2 PUFFS EVERY 6 HOURS AS NEEDED FOR SHORTNESS OF BREATH AND WHEEZING. (Patient taking differently: Inhale 2 puffs into the lungs every 6 (six) hours as needed for wheezing or shortness of breath. INHALE 2 PUFFS EVERY 6 HOURS AS NEEDED FOR SHORTNESS OF BREATH AND WHEEZING.)   Alcohol Swabs (GLOBAL ALCOHOL PREP EASE) 70 % PADS USE 1 PAD DAILY  WHEN CHECKING BLOOD SUGAR. R73.03   aspirin 81 MG EC tablet Take 1 tablet (81 mg total) by mouth daily. Swallow whole.   busPIRone (BUSPAR) 10 MG tablet TAKE 1 TABLET BY MOUTH TWICE DAILY AS NEEDED. (Patient taking differently: Take 10 mg by mouth 2 (two) times daily. )   butalbital-acetaminophen-caffeine (BAC) 50-325-40 MG tablet TAKE 1 TABLET BY MOUTH EVERY 6 HOURS AS NEEDED FOR HEADACHE. (Patient taking differently: Take 1 tablet by mouth every 6 (six) hours as needed for headache or migraine. [name])   carvedilol (COREG) 6.25 MG tablet TAKE 1 1/2 TABLETS BY MOUTH TWICE DAILY. (Patient taking  differently: Take 12.5 mg by mouth 3 (three) times daily as needed (blood pressure of 140-180). )   celecoxib (CELEBREX) 200 MG capsule Take 1 capsule (200 mg total) by mouth daily.   chlorzoxazone (PARAFON) 500 MG tablet TAKE (1) TABLET EVERY SIX HOURS AS NEEDED FOR MUSCLE SPASMS.   dexlansoprazole (DEXILANT) 60 MG capsule Take 1 capsule (60 mg total) by mouth daily.   diclofenac Sodium (VOLTAREN) 1 % GEL Apply 4 g topically 4 (four) times daily. (Patient taking differently: Apply 4 g topically 4 (four) times daily as needed (pain). )   DULoxetine (CYMBALTA) 60 MG capsule Take 1 capsule (60 mg total) by mouth at bedtime.   empagliflozin (JARDIANCE) 25 MG TABS tablet Take 1 tablet (25 mg total) by mouth daily before breakfast.   escitalopram (LEXAPRO) 20 MG tablet Take 1 tablet (20 mg total) by mouth daily.   estradiol (ESTRACE) 0.1 MG/GM vaginal cream Place 1 Applicatorful vaginally at bedtime.   FIBER PO Take 2 tablets by mouth in the morning, at noon, and at bedtime.   fludrocortisone (FLORINEF) 0.1 MG tablet TAKE 1 TABLET EVERY OTHER DAY. (Patient taking differently: Take 0.1 mg by mouth every other day. )   fluticasone (FLONASE) 50 MCG/ACT nasal spray Place 1 spray into both nostrils daily.   fluticasone furoate-vilanterol (BREO ELLIPTA) 200-25 MCG/INH AEPB Inhale 1 puff into the lungs daily.   furosemide (LASIX) 40 MG tablet TAKE 1 TABLET ONCE DAILY AS NEEDED FOR FLUID. (Patient taking differently: Take 40 mg by mouth daily as needed (blood pressure 150 or higher). )   hydrALAZINE (APRESOLINE) 25 MG tablet Take 1 tablet (25 mg total) by mouth 3 (three) times daily as needed (severe hypertension / systolic number 149 or greater). (Patient taking differently: Take 25 mg by mouth 3 (three) times daily as needed (severe hypertension / systolic number 702 or greater). )   HYDROcodone-acetaminophen (NORCO) 7.5-325 MG tablet Take 1 tablet by mouth 2 (two) times daily as needed.    ipratropium (ATROVENT) 0.02 % nebulizer solution Take 2.5 mLs (0.5 mg total) by nebulization every 6 (six) hours as needed for wheezing or shortness of breath.   isosorbide mononitrate (IMDUR) 30 MG 24 hr tablet Take 1 tablet (30 mg total) by mouth daily.   lamoTRIgine (LAMICTAL) 150 MG tablet Take 1 tablet (150 mg total) by mouth daily.   Lancets (ONETOUCH DELICA PLUS [id number]) MISC USE TO CHECK SUGAR DAILY.   levETIRAcetam (KEPPRA) 500 MG tablet TAKE (1) TABLET TWICE DAILY. (Patient taking differently: Take 500 mg by mouth 2 (two) times daily. )   levocetirizine (XYZAL) 5 MG tablet TAKE 1 TABLET BY MOUTH EVERY MORNING. (Patient taking differently: Take 5 mg by mouth every evening. )   lidocaine (XYLOCAINE) 5 % ointment APPLY AFFECTED AREA THREE TIMES DAILY AS NEEDED FOR MILD OR MODERATE PAIN. (Patient taking differently: Apply 1  application topically daily as needed for moderate pain. )   methocarbamol (ROBAXIN) 500 MG tablet Take 1 tablet (500 mg total) by mouth every 8 (eight) hours as needed for muscle spasms.   nitroGLYCERIN (NITROSTAT) 0.4 MG SL tablet PLACE ONE (1) TABLET UNDER TONGUE EVERY 5 MINUTES UP TO (3) DOSES AS NEEDED FOR CHEST PAIN. (Patient taking differently: Place 0.4 mg under the tongue every 5 (five) minutes as needed for chest pain. )   ondansetron (ZOFRAN) 4 MG tablet TAKE 1 TABLET BY MOUTH EVERY 8 HOURS AS NEEDED FOR NAUSEA AND VOMITING. (Patient taking differently: Take 4 mg by mouth every 8 (eight) hours as needed for nausea or vomiting. )   oxybutynin (DITROPAN) 5 MG tablet Take 1 tablet (5 mg total) by mouth at bedtime.   rosuvastatin (CRESTOR) 10 MG tablet Take 1 tablet (10 mg total) by mouth daily.   sucralfate (CARAFATE) 1 g tablet TAKE 1 TABLET 4 TIMES DAILY WITH MEALS AND AT BEDTIME. (Patient taking differently: Take 1 g by mouth in the morning, at noon, in the evening, and at bedtime. )   traZODone (DESYREL) 150 MG tablet Take 1 tablet (150 mg total) by  mouth at bedtime.   valACYclovir (VALTREX) 500 MG tablet Take 1 tablet (500 mg total) by mouth 2 (two) times daily.   No facility-administered encounter medications on file as of 01/26/2020.    Past Surgical History:  Procedure Laterality Date   BACK SURGERY     BREAST SURGERY     lumpectomy   CARDIAC CATHETERIZATION  10/06/2011   CATARACT EXTRACTION W/PHACO Right 07/31/2017   Procedure: CATARACT EXTRACTION PHACO AND INTRAOCULAR LENS PLACEMENT (IOC);  Surgeon: [name], MD;  Location: [facility];  Service: Ophthalmology;  Laterality: Right;  CDE: 2.33   CATARACT EXTRACTION W/PHACO Left 08/14/2017   Procedure: CATARACT EXTRACTION PHACO AND INTRAOCULAR LENS PLACEMENT (IOC);  Surgeon: [name], MD;  Location: [facility];  Service: Ophthalmology;  Laterality: Left;  CDE: 2.74   CHOLECYSTECTOMY     CYSTOSCOPY     stone   DIAGNOSTIC LAPAROSCOPY     laparoscopic cholecystectomy   DOPPLER ECHOCARDIOGRAPHY  2009   EYE SURGERY     head up tilt table testing  06/15/2007   [name]   HEMORRHOID SURGERY     insertion of implatable loop recorder  08/11/2007   [name]   POSTERIOR CERVICAL FUSION/FORAMINOTOMY N/A 12/19/2013   Procedure: RIGHT C3-4.C4-5 AND C5-6 FORAMINOTOMIES;  Surgeon: [name], MD;  Location: [facility];  Service: Orthopedics;  Laterality: N/A;   TOTAL SHOULDER ARTHROPLASTY Right 11/15/2019   Procedure: RIGHT TOTAL SHOULDER ARTHROPLASTY;  Surgeon: Meredith Pel, MD;  Location: [facility];  Service: Orthopedics;  Laterality: Right;   TUBAL LIGATION      Family History  Problem Relation Age of Onset   Heart attack Father    Cancer Father    Mental illness Father    Cancer Mother    Mental illness Mother    Heart attack Brother        stents   Alcohol abuse Brother    Heart disease Brother    Drug abuse Brother    Diabetes Brother    Colon cancer Maternal Aunt    Cirrhosis Brother    Stomach cancer Neg Hx     New  complaints: None today  Social history: Lives by herelf  Controlled substance contract: n/a    Review of Systems  Constitutional: Negative for diaphoresis.  Eyes:  Negative for pain.  Respiratory: Negative for shortness of breath.   Cardiovascular: Negative for chest pain, palpitations and leg swelling.  Gastrointestinal: Negative for abdominal pain.  Endocrine: Negative for polydipsia.  Musculoskeletal: Positive for arthralgias (left hip and right shoulder) and back pain.  Skin: Negative for rash.  Neurological: Negative for dizziness, weakness and headaches.  Hematological: Does not bruise/bleed easily.  Psychiatric/Behavioral: The patient is nervous/anxious.   All other systems reviewed and are negative.      Objective:   Physical Exam Vitals and nursing note reviewed.  Constitutional:      Appearance: Normal appearance.  Cardiovascular:     Rate and Rhythm: Normal rate and regular rhythm.     Heart sounds: Normal heart sounds.  Pulmonary:     Breath sounds: Normal breath sounds.  Abdominal:     General: Abdomen is flat.     Palpations: Abdomen is soft.  Musculoskeletal:     Comments: Limited ROM of right shoulder due t status post surgery  Skin:    General: Skin is warm and dry.  Neurological:     General: No focal deficit present.     Mental Status: She is alert and oriented to person, place, and time.  Psychiatric:        Mood and Affect: Mood normal.        Behavior: Behavior normal.   BP 130/86    Pulse 78    Temp 98.4 F (36.9 C) (Temporal)    Resp 20    Ht _0  (1.575 m)    Wt 148 lb (67.1 kg)    SpO2 93%    BMI 27.07 kg/m         Assessment & Plan:  JOYEL CHENETTE comes in today with chief complaint of Medical Management of Chronic Issues   Diagnosis and orders addressed:  1. Hyperlipidemia with target LDL less than 100 Low fat diet - Lipid panel - rosuvastatin (CRESTOR) 10 MG tablet; Take 1 tablet (10 mg total) by mouth daily.   Dispense: 90 tablet; Refill: 1  2. Essential hypertension Low sodium diet - CBC with Differential/Platelet - CMP14+EGFR  3. Chronic diastolic CHF (congestive heart failure) (HCC) Limit fluid intake  4. Unstable angina (HCC) Keep record of occurences  5. Obstructive sleep apnea syndrome Will worry about CPAP after surgeyis done  6. Gastroesophageal reflux disease with esophagitis without hemorrhage Avoid spicy foods Do not eat 2 hours prior to bedtime  7. Sigmoid diverticulitis Watch diet  8. Lumbosacral spondylosis without myelopathy  9. Age-related osteoporosis without current pathological fracture Weight bearing exercises once can tolerate  10. GAD (generalized anxiety disorder) Stress management - lamoTRIgine (LAMICTAL) 150 MG tablet; Take 1 tablet (150 mg total) by mouth daily.  Dispense: 90 tablet; Refill: 1  11. Primary insomnia Bedtime routine - traZODone (DESYREL) 150 MG tablet; Take 1 tablet (150 mg total) by mouth at bedtime.  Dispense: 90 tablet; Refill: 1  12. Seizures (HCC) - levETIRAcetam (KEPPRA) 500 MG tablet; TAKE (1) TABLET TWICE DAILY.  Dispense: 180 tablet; Refill: 1  13. Overweight (BMI 25.0-29.9) Discussed diet and exercise for person with BMI >25 Will recheck weight in 3-6 months  14. Allergic rhinitis, unspecified seasonality, unspecified trigger - fluticasone furoate-vilanterol (BREO ELLIPTA) 200-25 MCG/INH AEPB; Inhale 1 puff into the lungs daily.  Dispense: 60 each; Refill: 5  15. Anxiety state - busPIRone (BUSPAR) 10 MG tablet; Take 1 tablet (10 mg total) by mouth 2 (two) times daily as needed.  Dispense: 60 tablet; Refill: 1  16. Gastroesophageal reflux disease with esophagitis - dexlansoprazole (DEXILANT) 60 MG capsule; Take 1 capsule (60 mg total) by mouth daily.  Dispense: 90 capsule; Refill: 1  17. Herpes simplex virus (HSV) infection - valACYclovir (VALTREX) 500 MG tablet; Take 1 tablet (500 mg total) by mouth 2 (two) times daily.   Dispense: 180 tablet; Refill: 1  18. Herpes simplex vulvovaginitis - valACYclovir (VALTREX) 500 MG tablet; Take 1 tablet (500 mg total) by mouth 2 (two) times daily.  Dispense: 180 tablet; Refill: 1  19. Moderate episode of recurrent major depressive disorder (HCC) - DULoxetine (CYMBALTA) 60 MG capsule; Take 1 capsule (60 mg total) by mouth at bedtime.  Dispense: 90 capsule; Refill: 1 - escitalopram (LEXAPRO) 20 MG tablet; Take 1 tablet (20 mg total) by mouth daily.  Dispense: 30 tablet; Refill: 2 - empagliflozin (JARDIANCE) 25 MG TABS tablet; Take 1 tablet (25 mg total) by mouth daily before breakfast.  Dispense: 90 tablet; Refill: 3  20. Other chest pain - isosorbide mononitrate (IMDUR) 30 MG 24 hr tablet; Take 1 tablet (30 mg total) by mouth daily.  Dispense: 90 tablet; Refill: 1   Labs pending Health Maintenance reviewed Diet and exercise encouraged  Follow up plan: 6 months   [name] Hassell Done, FNP

## 2020-01-26 NOTE — Patient Instructions (Signed)

## 2020-01-26 NOTE — Progress Notes (Signed)
Your procedure is scheduled on November 2, Tuesday.  Report to [facility] Main Entrance "A" at 12:15 P.M., and check in at the Admitting office.  Call this number if you have problems the morning of surgery: [phone]  Call [phone] if you have any questions prior to your surgery date Monday-Friday 8am-4pm   Remember: Do not eat after midnight the night before your surgery  You may drink clear liquids until 11:15 the morning of your surgery.   Clear liquids allowed are: Water, Non-Citrus Juices (without pulp), Carbonated Beverages, Clear Tea, Black Coffee Only, and Gatorade   Take these medicines the morning of surgery with A SIP OF WATER:  albuterol (PROVENTIL) nebulizer  albuterol (VENTOLIN HFA) inhaler --- Please bring all inhalers with you the day of surgery.  busPIRone (BUSPAR)  carvedilol (COREG)  chlorzoxazone (PARAFON) dexlansoprazole (DEXILANT)  escitalopram (LEXAPRO)  hydrALAZINE (APRESOLINE)  ipratropium (ATROVENT) nebulizer isosorbide mononitrate (IMDUR)  lamoTRIgine (LAMICTAL) levETIRAcetam (KEPPRA)  rosuvastatin (CRESTOR) sucralfate (CARAFATE)  valACYclovir (VALTREX)    If needed:  HYDROcodone-acetaminophen (NORCO) methocarbamol (ROBAXIN)  nitroGLYCERIN (NITROSTAT)  ondansetron (ZOFRAN)   Follow your surgeon's instructions on when to stop Aspirin.  If no instructions were given by your surgeon then you will need to call the office to get those instructions.    As of today, STOP taking any Aspirin (unless otherwise instructed by your surgeon), celecoxib (CELEBREX), diclofenac Sodium (VOLTAREN) gel, Aleve, Naproxen, Ibuprofen, Motrin, Advil, Goody's, BC's, all herbal medications, fish oil, and all vitamins.    WHAT DO I DO ABOUT MY DIABETES MEDICATION?   [name] Do not take oral diabetes medicines (pills) the morning of surgery.  . THE NIGHT BEFORE SURGERY o empagliflozin (JARDIANCE) - do not take day before surgery      . THE MORNING OF  SURGERY o empagliflozin (JARDIANCE) - do not take day of surgery   HOW TO MANAGE YOUR DIABETES BEFORE AND AFTER SURGERY  Why is it important to control my blood sugar before and after surgery? . Improving blood sugar levels before and after surgery helps healing and can limit problems. . A way of improving blood sugar control is eating a healthy diet by: o  Eating less sugar and carbohydrates o  Increasing activity/exercise o  Talking with your doctor about reaching your blood sugar goals . High blood sugars (greater than 180 mg/dL) can raise your risk of infections and slow your recovery, so you will need to focus on controlling your diabetes during the weeks before surgery. . Make sure that the doctor who takes care of your diabetes knows about your planned surgery including the date and location.  How do I manage my blood sugar before surgery? . Check your blood sugar at least 4 times a day, starting 2 days before surgery, to make sure that the level is not too high or low. . Check your blood sugar the morning of your surgery when you wake up and every 2 hours until you get to the Short Stay unit. o If your blood sugar is less than 70 mg/dL, you will need to treat for low blood sugar: - Do not take insulin. - Treat a low blood sugar (less than 70 mg/dL) with  cup of clear juice (cranberry or apple), 4 glucose tablets, OR glucose gel. - Recheck blood sugar in 15 minutes after treatment (to make sure it is greater than 70 mg/dL). If your blood sugar is not greater than 70 mg/dL on recheck, call [phone] for further instructions. [name]  Report your blood sugar to the short stay nurse when you get to Short Stay.  . If you are admitted to the hospital after surgery: o Your blood sugar will be checked by the staff and you will probably be given insulin after surgery (instead of oral diabetes medicines) to make sure you have good blood sugar levels. o The goal for blood sugar control after  surgery is 80-180 mg/dL.     The Morning of Surgery  Do not wear jewelry, make-up or nail polish.  Do not wear lotions, powders, or perfumes, or deodorant  Do not shave 48 hours prior to surgery.    Do not bring valuables to the hospital.  [facility] is not responsible for any belongings or valuables.  If you are a smoker, DO NOT Smoke 24 hours prior to surgery  If you wear a CPAP at night please bring your mask the morning of surgery   Remember that you must have someone to transport you home after your surgery, and remain with you for 24 hours if you are discharged the same day.   Please bring cases for contacts, glasses, hearing aids, dentures or bridgework because it cannot be worn into surgery.    Leave your suitcase in the car.  After surgery it may be brought to your room.  For patients admitted to the hospital, discharge time will be determined by your treatment team.  Patients discharged the day of surgery will not be allowed to drive home.    Special instructions:   [place]- Preparing For Surgery  Before surgery, you can play an important role. Because skin is not sterile, your skin needs to be as free of germs as possible. You can reduce the number of germs on your skin by washing with CHG (chlorahexidine gluconate) Soap before surgery.  CHG is an antiseptic cleaner which kills germs and bonds with the skin to continue killing germs even after washing.    Oral Hygiene is also important to reduce your risk of infection.  Remember - BRUSH YOUR TEETH THE MORNING OF SURGERY WITH YOUR REGULAR TOOTHPASTE  Please do not use if you have an allergy to CHG or antibacterial soaps. If your skin becomes reddened/irritated stop using the CHG.  Do not shave (including legs and underarms) for at least 48 hours prior to first CHG shower. It is OK to shave your face.  Please follow these instructions carefully.   1. Shower the NIGHT BEFORE SURGERY and the MORNING OF SURGERY with  CHG Soap.   2. If you chose to wash your hair and body, wash as usual with your normal shampoo and body-wash/soap.  3. Rinse your hair and body thoroughly to remove the shampoo and soap.  4. Apply CHG directly to the skin (ONLY FROM THE NECK DOWN) and wash gently with a scrungie or a clean washcloth.   5. Do not use on open wounds or open sores. Avoid contact with your eyes, ears, mouth and genitals (private parts). Wash Face and genitals (private parts)  with your normal soap.   6. Wash thoroughly, paying special attention to the area where your surgery will be performed.  7. Thoroughly rinse your body with warm water from the neck down.  8. DO NOT shower/wash with your normal soap after using and rinsing off the CHG Soap.  9. Pat yourself dry with a CLEAN TOWEL.  10. Wear CLEAN PAJAMAS to bed the night before surgery  11. Place CLEAN SHEETS on your  bed the night of your first shower and DO NOT SLEEP WITH PETS.  12. Wear comfortable clothes the morning of surgery.     Day of Surgery:  Please shower the morning of surgery with the CHG soap Do not apply any deodorants/lotions. Please wear clean clothes to the hospital/surgery center.   Remember to brush your teeth WITH YOUR REGULAR TOOTHPASTE.   Please read over the following fact sheets that you were given.

## 2020-01-27 ENCOUNTER — Encounter (HOSPITAL_COMMUNITY): Payer: Self-pay

## 2020-01-27 ENCOUNTER — Telehealth: Payer: Self-pay | Admitting: Orthopaedic Surgery

## 2020-01-27 ENCOUNTER — Other Ambulatory Visit: Payer: Self-pay | Admitting: Nurse Practitioner

## 2020-01-27 ENCOUNTER — Other Ambulatory Visit: Payer: Self-pay | Admitting: Specialist

## 2020-01-27 ENCOUNTER — Encounter (HOSPITAL_COMMUNITY)
Admission: RE | Admit: 2020-01-27 | Discharge: 2020-01-27 | Disposition: A | Discharge status: Home / Self Care | Payer: 59 | Source: Ambulatory Visit | Attending: Orthopaedic Surgery | Admitting: Orthopaedic Surgery

## 2020-01-27 ENCOUNTER — Other Ambulatory Visit (HOSPITAL_COMMUNITY)
Admission: RE | Admit: 2020-01-27 | Discharge: 2020-01-27 | Disposition: A | Discharge status: Home / Self Care | Payer: 59 | Source: Ambulatory Visit | Attending: Orthopaedic Surgery | Admitting: Orthopaedic Surgery

## 2020-01-27 ENCOUNTER — Other Ambulatory Visit: Payer: Self-pay

## 2020-01-27 DIAGNOSIS — Z20822 Contact with and (suspected) exposure to covid-19: Secondary | ICD-10-CM | POA: Insufficient documentation

## 2020-01-27 DIAGNOSIS — Z01812 Encounter for preprocedural laboratory examination: Secondary | ICD-10-CM | POA: Insufficient documentation

## 2020-01-27 DIAGNOSIS — Z01818 Encounter for other preprocedural examination: Secondary | ICD-10-CM | POA: Diagnosis present

## 2020-01-27 DIAGNOSIS — G44229 Chronic tension-type headache, not intractable: Secondary | ICD-10-CM

## 2020-01-27 LAB — CBC WITH DIFFERENTIAL/PLATELET
Basophils Absolute: 0 10*3/uL (ref 0.0–0.2)
Basos: 0 %
EOS (ABSOLUTE): 0.2 10*3/uL (ref 0.0–0.4)
Eos: 2 %
Hematocrit: 34.8 % (ref 34.0–46.6)
Hemoglobin: 10 g/dL — ABNORMAL LOW (ref 11.1–15.9)
Immature Grans (Abs): 0 10*3/uL (ref 0.0–0.1)
Immature Granulocytes: 0 %
Lymphocytes Absolute: 3.5 10*3/uL — ABNORMAL HIGH (ref 0.7–3.1)
Lymphs: 42 %
MCH: 21.4 pg — ABNORMAL LOW (ref 26.6–33.0)
MCHC: 28.7 g/dL — ABNORMAL LOW (ref 31.5–35.7)
MCV: 75 fL — ABNORMAL LOW (ref 79–97)
Monocytes Absolute: 0.8 10*3/uL (ref 0.1–0.9)
Monocytes: 10 %
Neutrophils Absolute: 3.9 10*3/uL (ref 1.4–7.0)
Neutrophils: 46 %
Platelets: 264 10*3/uL (ref 150–450)
RBC: 4.67 x10E6/uL (ref 3.77–5.28)
RDW: 15.9 % — ABNORMAL HIGH (ref 11.7–15.4)
WBC: 8.5 10*3/uL (ref 3.4–10.8)

## 2020-01-27 LAB — CBC
HCT: 36.8 % (ref 36.0–46.0)
Hemoglobin: 10 g/dL — ABNORMAL LOW (ref 12.0–15.0)
MCH: 21.1 pg — ABNORMAL LOW (ref 26.0–34.0)
MCHC: 27.2 g/dL — ABNORMAL LOW (ref 30.0–36.0)
MCV: 77.8 fL — ABNORMAL LOW (ref 80.0–100.0)
Platelets: 223 10*3/uL (ref 150–400)
RBC: 4.73 MIL/uL (ref 3.87–5.11)
RDW: 16.7 % — ABNORMAL HIGH (ref 11.5–15.5)
WBC: 6.9 10*3/uL (ref 4.0–10.5)
nRBC: 0 % (ref 0.0–0.2)

## 2020-01-27 LAB — SURGICAL PCR SCREEN
MRSA, PCR: NEGATIVE
Staphylococcus aureus: NEGATIVE

## 2020-01-27 LAB — CMP14+EGFR
ALT: 9 IU/L (ref 0–32)
AST: 14 IU/L (ref 0–40)
Albumin/Globulin Ratio: 1.6 (ref 1.2–2.2)
Albumin: 4.2 g/dL (ref 3.8–4.8)
Alkaline Phosphatase: 119 IU/L (ref 44–121)
BUN/Creatinine Ratio: 13 (ref 12–28)
BUN: 10 mg/dL (ref 8–27)
Bilirubin Total: <0.2 mg/dL (ref 0.0–1.2)
CO2: 27 mmol/L (ref 20–29)
Calcium: 9.5 mg/dL (ref 8.7–10.3)
Chloride: 106 mmol/L (ref 96–106)
Creatinine, Ser: 0.75 mg/dL (ref 0.57–1.00)
GFR calc Af Amer: 98 mL/min/{1.73_m2} (ref >59)
GFR calc non Af Amer: 85 mL/min/{1.73_m2} (ref >59)
Globulin, Total: 2.7 g/dL (ref 1.5–4.5)
Glucose: 112 mg/dL — ABNORMAL HIGH (ref 65–99)
Potassium: 4.5 mmol/L (ref 3.5–5.2)
Sodium: 144 mmol/L (ref 134–144)
Total Protein: 6.9 g/dL (ref 6.0–8.5)

## 2020-01-27 LAB — LIPID PANEL
Chol/HDL Ratio: 3.7 ratio (ref 0.0–4.4)
Cholesterol, Total: 158 mg/dL (ref 100–199)
HDL: 43 mg/dL (ref >39)
LDL Chol Calc (NIH): 79 mg/dL (ref 0–99)
Triglycerides: 219 mg/dL — ABNORMAL HIGH (ref 0–149)
VLDL Cholesterol Cal: 36 mg/dL (ref 5–40)

## 2020-01-27 LAB — BASIC METABOLIC PANEL
Anion gap: 11 (ref 5–15)
BUN: 10 mg/dL (ref 8–23)
CO2: 24 mmol/L (ref 22–32)
Calcium: 9.5 mg/dL (ref 8.9–10.3)
Chloride: 105 mmol/L (ref 98–111)
Creatinine, Ser: 0.83 mg/dL (ref 0.44–1.00)
GFR, Estimated: >60 mL/min (ref >60)
Glucose, Bld: 142 mg/dL — ABNORMAL HIGH (ref 70–99)
Potassium: 3.6 mmol/L (ref 3.5–5.1)
Sodium: 140 mmol/L (ref 135–145)

## 2020-01-27 LAB — GLUCOSE, CAPILLARY: Glucose-Capillary: 123 mg/dL — ABNORMAL HIGH (ref 70–99)

## 2020-01-27 NOTE — Telephone Encounter (Signed)
Pt called asking for hospital bed and a shower bench and a raised toilet.  pt left from pre op and they give her these orders  ash

## 2020-01-27 NOTE — Telephone Encounter (Signed)
That is fine 

## 2020-01-27 NOTE — Progress Notes (Addendum)
PCP:  Bennie Pierini, MD Cardiologist: Dina Rich, MD  EKG:  04/15/19 CXR:  02/12/20 ECHO:  01/18/19 Stress Test:  Denies Cardiac Cath:   [name]  Fasting Blood Sugar-140-280 Checks Blood Sugar__3_ times a day BG 123 at PAT appointment  Covid test 01/27/20  Anesthesia Review:  Yes, cardiac history. CAD, MI, dysrhythmia  Patient denies shortness of breath, fever, cough, and chest pain at PAT appointment.  Patient verbalized understanding of instructions provided today at the PAT appointment.  Patient asked to review instructions at home and day of surgery.

## 2020-01-27 NOTE — Telephone Encounter (Signed)
Patient states she wants a hospital bed because she has 15 stairs to her upstairs room

## 2020-01-28 ENCOUNTER — Other Ambulatory Visit: Payer: Self-pay | Admitting: Nurse Practitioner

## 2020-01-28 LAB — SARS CORONAVIRUS 2 (TAT 6-24 HRS): SARS Coronavirus 2: NEGATIVE

## 2020-01-30 NOTE — Progress Notes (Signed)
Anesthesia Chart Review:  Patient follows with cardiology for history of difficult to manage hypertension with concurrent syncope/autonomic dysfunction.  She is currently on a regimen of Florinef 0.1 mg every other day and Coreg 9.37 mg twice daily.  This has reportedly been working well for her.  Recently, however, she has had a run of elevated blood pressure readings and was seen by Dr. Wyline Mood on 01/13/2020 and discussed.  Per note, "Syncope/Autonomicdysfunction -difficult balance between severe symptomatic both hypotension and HTN. Currently on florinef 0.1mg every other dayand coreg9.375mg  bid. -recent spell of very high bp's that has since resolved. We will add hydralzine 25mg  prn for potential  recurrent episodes."  She also has a history of longstanding recurrent chest pain and has had numerous negative work-ups.  Most recently, she had a Lexiscan 09/2017 that was negative.  She had a cath in 2013 with patent vessels.  Her symptoms did improve with Imdur.  She is also wore a loop recorder that showed no clear arrhythmias.  She recently underwent right total shoulder arthroplasty 11/15/2018 without complication.  Prior to that surgery she was cleared by cardiologist Dr. 11/17/2018 stating, "- multiple negative cardiac evaluations in the past, last stress test 2019 without ischemia - recommend proceeding with ortho surgery if indicated."  History of OSA, not currently on CPAP.  Per PCP notes she is working on getting a machine.  History of seizure like activity followed by neurology, maintained on Keppra.  EEG was normal, unclear if some of her reported symptoms are psychogenic.  DM2, A1c 6.8 on 11/08/2019.  Preop labs reviewed, mild anemia with hemoglobin 10.0.  Remainder of labs unremarkable.  She was recently recommended to take iron supplementation by PCP for anemia.  TTE 01/18/2019: 1. Left ventricular ejection fraction, by visual estimation, is 60 to  65%. The left ventricle has normal  function. There is mildly increased  left ventricular hypertrophy.  2. Left ventricular diastolic Doppler parameters are consistent with  impaired relaxation pattern of LV diastolic filling.  3. Global right ventricle has normal systolic function.The right  ventricular size is normal. No increase in right ventricular wall  thickness.  4. Left atrial size was normal.  5. Right atrial size was normal.  6. The mitral valve is normal in structure. No evidence of mitral valve  regurgitation. No evidence of mitral stenosis.  7. The tricuspid valve is normal in structure. Tricuspid valve  regurgitation was not visualized by color flow Doppler.  8. The aortic valve is tricuspid Aortic valve regurgitation is mild to  moderate by color flow Doppler. Structurally normal aortic valve, with no  evidence of sclerosis or stenosis.  9. The pulmonic valve was not well visualized. Pulmonic valve  regurgitation is not visualized by color flow Doppler.  10. Normal pulmonary artery systolic pressure.   09/2017 nuclear stress:  There was no ST segment deviation noted during stress. Nonspecific T wave flattening in aVL and V2 seen throughout study.  Defect 1: There is a medium defect of mild severity present in the mid inferior, apical inferior and apical lateral location. This appears to be due to soft tissue attenuation given normal regional wall motion. No ischemic territories.  This is a low risk study.  Nuclear stress EF: 65%.  09/2011 Cath: Hemodynamic Findings: Central aortic pressure: 108/58  Left ventricular pressure: 107/10/14  Angiographic Findings:  Left main: No obstructive disease noted.  Left Anterior Descending Artery: Moderate to large sized vessel that courses to the apex. There is a moderate sized  diagonal branch. No obstructive disease noted.  Circumflex Artery: Large, dominant artery with moderate sized first obtuse marginal branch and left sided posterolateral  branch with no disease noted.  Right Coronary Artery: Small, non-dominant vessel with no disease noted.  Left Ventricular Angiogram:LVEF=65%.  Impression:  1. No angiographic evidence of CAD  2. Normal LV systolic function  3. Non-cardiac chest pain  Recommendations: No further ischemic workup.  Complications: None. The patient tolerated the procedure well.     Zannie Cove [facility]/Anesthesiology Phone [phone] 01/30/2020 12:47 PM

## 2020-01-30 NOTE — Anesthesia Preprocedure Evaluation (Addendum)
Anesthesia Evaluation  Patient identified by MRN, date of birth, ID band Patient awake    Reviewed: Allergy & Precautions, NPO status , Patient's Chart, lab work & pertinent test results  Airway Mallampati: II  TM Distance: >3 FB Neck ROM: Full    Dental no notable dental hx.    Pulmonary COPD,  COPD inhaler,    Pulmonary exam normal breath sounds clear to auscultation       Cardiovascular hypertension, + CAD  Normal cardiovascular exam Rhythm:Regular Rate:Normal     Neuro/Psych Seizures -, Well Controlled,  Bipolar Disorder    GI/Hepatic Neg liver ROS, GERD  Medicated,  Endo/Other  negative endocrine ROSdiabetes  Renal/GU negative Renal ROS  negative genitourinary   Musculoskeletal negative musculoskeletal ROS (+)   Abdominal   Peds negative pediatric ROS (+)  Hematology  (+) anemia ,   Anesthesia Other Findings   Reproductive/Obstetrics negative OB ROS                           Anesthesia Physical Anesthesia Plan  ASA: III  Anesthesia Plan: General   Post-op Pain Management:    Induction: Intravenous  PONV Risk Score and Plan: 2 and Ondansetron, Dexamethasone and Treatment may vary due to age or medical condition  Airway Management Planned: Oral ETT  Additional Equipment:   Intra-op Plan:   Post-operative Plan: Extubation in OR  Informed Consent: I have reviewed the patients History and Physical, chart, labs and discussed the procedure including the risks, benefits and alternatives for the proposed anesthesia with the patient or authorized representative who has indicated his/her understanding and acceptance.     Dental advisory given  Plan Discussed with: CRNA and Surgeon  Anesthesia Plan Comments: (PAT note by Antionette Poles, PA-C: Patient follows with cardiology for history of difficult to manage hypertension with concurrent syncope/autonomic dysfunction.  She is  currently on a regimen of Florinef 0.1 mg every other day and Coreg 9.37 mg twice daily.  This has reportedly been working well for her.  Recently, however, she has had a run of elevated blood pressure readings and was seen by Dr. Wyline Mood on 01/13/2020 and discussed.  Per note, "Syncope/Autonomicdysfunction -difficult balance between severe symptomatic both hypotension and HTN. Currently on florinef 0.1mg every other dayand coreg9.375mg  bid. -recent spell of very high bp's that has since resolved. We will add hydralzine 25mg  prn for potential  recurrent episodes."  She also has a history of longstanding recurrent chest pain and has had numerous negative work-ups.  Most recently, she had a Lexiscan 09/2017 that was negative.  She had a cath in 2013 with patent vessels.  Her symptoms did improve with Imdur.  She is also wore a loop recorder that showed no clear arrhythmias.  She recently underwent right total shoulder arthroplasty 11/15/2018 without complication.  Prior to that surgery she was cleared by cardiologist Dr. 11/17/2018 stating, "- multiple negative cardiac evaluations in the past, last stress test 2019 without ischemia - recommend proceeding with ortho surgery if indicated."  History of OSA, not currently on CPAP.  Per PCP notes she is working on getting a machine.  History of seizure like activity followed by neurology, maintained on Keppra.  EEG was normal, unclear if some of her reported symptoms are psychogenic.  DM2, A1c 6.8 on 11/08/2019.  Preop labs reviewed, mild anemia with hemoglobin 10.0.  Remainder of labs unremarkable.  She was recently recommended to take iron supplementation by PCP for anemia.  TTE 01/18/2019: 1. Left ventricular ejection fraction, by visual estimation, is 60 to  65%. The left ventricle has normal function. There is mildly increased  left ventricular hypertrophy.  2. Left ventricular diastolic Doppler parameters are consistent with  impaired relaxation  pattern of LV diastolic filling.  3. Global right ventricle has normal systolic function.The right  ventricular size is normal. No increase in right ventricular wall  thickness.  4. Left atrial size was normal.  5. Right atrial size was normal.  6. The mitral valve is normal in structure. No evidence of mitral valve  regurgitation. No evidence of mitral stenosis.  7. The tricuspid valve is normal in structure. Tricuspid valve  regurgitation was not visualized by color flow Doppler.  8. The aortic valve is tricuspid Aortic valve regurgitation is mild to  moderate by color flow Doppler. Structurally normal aortic valve, with no  evidence of sclerosis or stenosis.  9. The pulmonic valve was not well visualized. Pulmonic valve  regurgitation is not visualized by color flow Doppler.  10. Normal pulmonary artery systolic pressure.   09/2017 nuclear stress: There was no ST segment deviation noted during stress. Nonspecific T wave flattening in aVL and V2 seen throughout study. Defect 1: There is a medium defect of mild severity present in the mid inferior, apical inferior and apical lateral location. This appears to be due to soft tissue attenuation given normal regional wall motion. No ischemic territories. This is a low risk study. Nuclear stress EF: 65%.  09/2011 Cath: Hemodynamic Findings: Central aortic pressure: 108/58  Left ventricular pressure: 107/10/14  Angiographic Findings:  Left main: No obstructive disease noted.  Left Anterior Descending Artery: Moderate to large sized vessel that courses to the apex. There is a moderate sized diagonal branch. No obstructive disease noted.  Circumflex Artery: Large, dominant artery with moderate sized first obtuse marginal branch and left sided posterolateral branch with no disease noted.  Right Coronary Artery: Small, non-dominant vessel with no disease noted.  Left Ventricular Angiogram:LVEF=65%.  Impression:  1. No  angiographic evidence of CAD  2. Normal LV systolic function  3. Non-cardiac chest pain  Recommendations: No further ischemic workup.  Complications: None. The patient tolerated the procedure well.  )      Anesthesia Quick Evaluation

## 2020-01-30 NOTE — Telephone Encounter (Signed)
Patient aware Rx was sent to [facility] for hospital bed

## 2020-01-31 ENCOUNTER — Other Ambulatory Visit: Payer: Self-pay

## 2020-01-31 ENCOUNTER — Ambulatory Visit (HOSPITAL_COMMUNITY): Payer: 59

## 2020-01-31 ENCOUNTER — Observation Stay (HOSPITAL_COMMUNITY): Payer: 59

## 2020-01-31 ENCOUNTER — Observation Stay (HOSPITAL_COMMUNITY)
Admission: RE | Admit: 2020-01-31 | Discharge: 2020-02-01 | Disposition: A | Discharge status: Home / Self Care | Payer: 59 | Attending: Orthopaedic Surgery | Admitting: Orthopaedic Surgery

## 2020-01-31 ENCOUNTER — Encounter (HOSPITAL_COMMUNITY): Payer: Self-pay | Admitting: Orthopaedic Surgery

## 2020-01-31 ENCOUNTER — Ambulatory Visit (HOSPITAL_COMMUNITY): Payer: 59 | Admitting: Vascular Surgery

## 2020-01-31 ENCOUNTER — Ambulatory Visit (HOSPITAL_COMMUNITY): Payer: 59 | Admitting: Anesthesiology

## 2020-01-31 ENCOUNTER — Encounter (HOSPITAL_COMMUNITY)
Admission: RE | Disposition: A | Discharge status: Home / Self Care | Payer: Self-pay | Source: Home / Self Care | Attending: Orthopaedic Surgery

## 2020-01-31 ENCOUNTER — Telehealth: Payer: Self-pay

## 2020-01-31 ENCOUNTER — Telehealth: Payer: Self-pay | Admitting: Orthopaedic Surgery

## 2020-01-31 DIAGNOSIS — M1612 Unilateral primary osteoarthritis, left hip: Principal | ICD-10-CM | POA: Insufficient documentation

## 2020-01-31 DIAGNOSIS — Z419 Encounter for procedure for purposes other than remedying health state, unspecified: Secondary | ICD-10-CM

## 2020-01-31 DIAGNOSIS — E119 Type 2 diabetes mellitus without complications: Secondary | ICD-10-CM | POA: Diagnosis not present

## 2020-01-31 DIAGNOSIS — Z96642 Presence of left artificial hip joint: Secondary | ICD-10-CM

## 2020-01-31 DIAGNOSIS — I119 Hypertensive heart disease without heart failure: Secondary | ICD-10-CM | POA: Insufficient documentation

## 2020-01-31 DIAGNOSIS — I5032 Chronic diastolic (congestive) heart failure: Secondary | ICD-10-CM | POA: Diagnosis not present

## 2020-01-31 DIAGNOSIS — I25110 Atherosclerotic heart disease of native coronary artery with unstable angina pectoris: Secondary | ICD-10-CM | POA: Diagnosis not present

## 2020-01-31 DIAGNOSIS — J449 Chronic obstructive pulmonary disease, unspecified: Secondary | ICD-10-CM | POA: Diagnosis not present

## 2020-01-31 DIAGNOSIS — Z96611 Presence of right artificial shoulder joint: Secondary | ICD-10-CM | POA: Diagnosis not present

## 2020-01-31 DIAGNOSIS — M25552 Pain in left hip: Secondary | ICD-10-CM | POA: Diagnosis present

## 2020-01-31 DIAGNOSIS — J45909 Unspecified asthma, uncomplicated: Secondary | ICD-10-CM | POA: Diagnosis not present

## 2020-01-31 HISTORY — PX: TOTAL HIP ARTHROPLASTY: SHX124

## 2020-01-31 LAB — GLUCOSE, CAPILLARY
Glucose-Capillary: 142 mg/dL — ABNORMAL HIGH (ref 70–99)
Glucose-Capillary: 138 mg/dL — ABNORMAL HIGH (ref 70–99)
Glucose-Capillary: 170 mg/dL — ABNORMAL HIGH (ref 70–99)

## 2020-01-31 SURGERY — ARTHROPLASTY, HIP, TOTAL, ANTERIOR APPROACH
Anesthesia: Spinal | Site: Hip | Laterality: Left

## 2020-01-31 MED ORDER — ONDANSETRON HCL 4 MG/2ML IJ SOLN
INTRAMUSCULAR | Status: AC
  Filled 2020-01-31: qty 2

## 2020-01-31 MED ORDER — PHENYLEPHRINE HCL-NACL 10-0.9 MG/250ML-% IV SOLN
INTRAVENOUS | Status: DC | PRN
  Administered 2020-01-31: 50 ug/min via INTRAVENOUS

## 2020-01-31 MED ORDER — DEXAMETHASONE SODIUM PHOSPHATE 10 MG/ML IJ SOLN
INTRAMUSCULAR | Status: AC
  Filled 2020-01-31: qty 1

## 2020-01-31 MED ORDER — PANTOPRAZOLE SODIUM 40 MG PO TBEC
40.0000 mg | DELAYED_RELEASE_TABLET | Freq: Every day | ORAL | Status: DC
  Administered 2020-01-31 – 2020-02-01 (×2): 40 mg via ORAL
  Filled 2020-01-31 (×2): qty 1

## 2020-01-31 MED ORDER — PROPOFOL 500 MG/50ML IV EMUL: INTRAVENOUS | Status: DC | PRN

## 2020-01-31 MED ORDER — CHLORHEXIDINE GLUCONATE 0.12 % MT SOLN
OROMUCOSAL | Status: AC
  Administered 2020-01-31: 15 mL via OROMUCOSAL
  Filled 2020-01-31: qty 15

## 2020-01-31 MED ORDER — POVIDONE-IODINE 10 % EX SWAB: 2.0000 "application " | Freq: Once | CUTANEOUS | Status: DC

## 2020-01-31 MED ORDER — DEXAMETHASONE SODIUM PHOSPHATE 10 MG/ML IJ SOLN
INTRAMUSCULAR | Status: DC | PRN
  Administered 2020-01-31: 10 mg via INTRAVENOUS

## 2020-01-31 MED ORDER — ACETAMINOPHEN 10 MG/ML IV SOLN
INTRAVENOUS | Status: AC
  Filled 2020-01-31: qty 100

## 2020-01-31 MED ORDER — METHOCARBAMOL 500 MG PO TABS
500.0000 mg | ORAL_TABLET | Freq: Four times a day (QID) | ORAL | Status: DC | PRN
  Administered 2020-01-31 – 2020-02-01 (×3): 500 mg via ORAL
  Filled 2020-01-31 (×3): qty 1

## 2020-01-31 MED ORDER — METOCLOPRAMIDE HCL 5 MG/ML IJ SOLN: 5.0000 mg | Freq: Three times a day (TID) | INTRAMUSCULAR | Status: DC | PRN

## 2020-01-31 MED ORDER — ONDANSETRON HCL 4 MG PO TABS: 4.0000 mg | ORAL_TABLET | Freq: Four times a day (QID) | ORAL | Status: DC | PRN

## 2020-01-31 MED ORDER — LEVETIRACETAM 250 MG PO TABS
500.0000 mg | ORAL_TABLET | Freq: Two times a day (BID) | ORAL | Status: DC
  Administered 2020-01-31 – 2020-02-01 (×2): 500 mg via ORAL
  Filled 2020-01-31 (×2): qty 2

## 2020-01-31 MED ORDER — 0.9 % SODIUM CHLORIDE (POUR BTL) OPTIME
TOPICAL | Status: DC | PRN
  Administered 2020-01-31: 1000 mL

## 2020-01-31 MED ORDER — CEFAZOLIN SODIUM-DEXTROSE 2-4 GM/100ML-% IV SOLN
2.0000 g | INTRAVENOUS | Status: AC
  Administered 2020-01-31: 2 g via INTRAVENOUS

## 2020-01-31 MED ORDER — ISOSORBIDE MONONITRATE ER 30 MG PO TB24
30.0000 mg | ORAL_TABLET | Freq: Every day | ORAL | Status: DC
  Administered 2020-02-01: 30 mg via ORAL
  Filled 2020-01-31: qty 1

## 2020-01-31 MED ORDER — LAMOTRIGINE 150 MG PO TABS
150.0000 mg | ORAL_TABLET | Freq: Every day | ORAL | Status: DC
  Administered 2020-01-31 – 2020-02-01 (×2): 150 mg via ORAL
  Filled 2020-01-31 (×2): qty 1

## 2020-01-31 MED ORDER — IPRATROPIUM BROMIDE 0.02 % IN SOLN
0.5000 mg | Freq: Four times a day (QID) | RESPIRATORY_TRACT | Status: DC | PRN
  Filled 2020-01-31: qty 2.5

## 2020-01-31 MED ORDER — VALACYCLOVIR HCL 500 MG PO TABS
500.0000 mg | ORAL_TABLET | Freq: Two times a day (BID) | ORAL | Status: DC
  Administered 2020-01-31 – 2020-02-01 (×2): 500 mg via ORAL
  Filled 2020-01-31 (×3): qty 1

## 2020-01-31 MED ORDER — DIPHENHYDRAMINE HCL 12.5 MG/5ML PO ELIX
12.5000 mg | ORAL_SOLUTION | ORAL | Status: DC | PRN
  Filled 2020-01-31: qty 10

## 2020-01-31 MED ORDER — ALBUTEROL SULFATE HFA 108 (90 BASE) MCG/ACT IN AERS: 2.0000 | INHALATION_SPRAY | Freq: Four times a day (QID) | RESPIRATORY_TRACT | Status: DC | PRN

## 2020-01-31 MED ORDER — ORAL CARE MOUTH RINSE: 15.0000 mL | Freq: Once | OROMUCOSAL | Status: AC

## 2020-01-31 MED ORDER — LIDOCAINE 2% (20 MG/ML) 5 ML SYRINGE
INTRAMUSCULAR | Status: DC | PRN
  Administered 2020-01-31: 100 mg via INTRAVENOUS

## 2020-01-31 MED ORDER — ACETAMINOPHEN 325 MG PO TABS
325.0000 mg | ORAL_TABLET | Freq: Four times a day (QID) | ORAL | Status: DC | PRN
  Administered 2020-02-01: 650 mg via ORAL
  Filled 2020-01-31: qty 2

## 2020-01-31 MED ORDER — CHLORHEXIDINE GLUCONATE 0.12 % MT SOLN: 15.0000 mL | Freq: Once | OROMUCOSAL | Status: AC

## 2020-01-31 MED ORDER — HYDROMORPHONE HCL 1 MG/ML IJ SOLN
0.2500 mg | INTRAMUSCULAR | Status: DC | PRN
  Administered 2020-01-31: 1 mg via INTRAVENOUS
  Administered 2020-01-31 (×2): 0.5 mg via INTRAVENOUS

## 2020-01-31 MED ORDER — ALUM & MAG HYDROXIDE-SIMETH 200-200-20 MG/5ML PO SUSP: 30.0000 mL | ORAL | Status: DC | PRN

## 2020-01-31 MED ORDER — HYDROMORPHONE HCL 1 MG/ML IJ SOLN
1.0000 mg | INTRAMUSCULAR | Status: DC | PRN
  Administered 2020-01-31: 1 mg via INTRAVENOUS
  Filled 2020-01-31: qty 1

## 2020-01-31 MED ORDER — CEFAZOLIN SODIUM-DEXTROSE 2-4 GM/100ML-% IV SOLN
INTRAVENOUS | Status: AC
  Filled 2020-01-31: qty 100

## 2020-01-31 MED ORDER — MIDAZOLAM HCL 5 MG/5ML IJ SOLN
INTRAMUSCULAR | Status: DC | PRN
  Administered 2020-01-31: 2 mg via INTRAVENOUS

## 2020-01-31 MED ORDER — OXYCODONE HCL 5 MG PO TABS: 10.0000 mg | ORAL_TABLET | ORAL | Status: DC | PRN

## 2020-01-31 MED ORDER — METOCLOPRAMIDE HCL 5 MG PO TABS: 5.0000 mg | ORAL_TABLET | Freq: Three times a day (TID) | ORAL | Status: DC | PRN

## 2020-01-31 MED ORDER — ACETAMINOPHEN 10 MG/ML IV SOLN
INTRAVENOUS | Status: DC | PRN
  Administered 2020-01-31: 1000 mg via INTRAVENOUS

## 2020-01-31 MED ORDER — EMPAGLIFLOZIN 25 MG PO TABS
25.0000 mg | ORAL_TABLET | Freq: Every day | ORAL | Status: DC
  Administered 2020-02-01: 25 mg via ORAL
  Filled 2020-01-31: qty 1

## 2020-01-31 MED ORDER — PHENOL 1.4 % MT LIQD: 1.0000 | OROMUCOSAL | Status: DC | PRN

## 2020-01-31 MED ORDER — FENTANYL CITRATE (PF) 100 MCG/2ML IJ SOLN
INTRAMUSCULAR | Status: DC | PRN
  Administered 2020-01-31 (×5): 50 ug via INTRAVENOUS

## 2020-01-31 MED ORDER — ALBUTEROL SULFATE (2.5 MG/3ML) 0.083% IN NEBU: 2.5000 mg | INHALATION_SOLUTION | RESPIRATORY_TRACT | Status: DC | PRN

## 2020-01-31 MED ORDER — MIDAZOLAM HCL 2 MG/2ML IJ SOLN
INTRAMUSCULAR | Status: AC
  Filled 2020-01-31: qty 2

## 2020-01-31 MED ORDER — KETOROLAC TROMETHAMINE 15 MG/ML IJ SOLN
7.5000 mg | Freq: Four times a day (QID) | INTRAMUSCULAR | Status: AC
  Administered 2020-01-31 – 2020-02-01 (×4): 7.5 mg via INTRAVENOUS
  Filled 2020-01-31 (×3): qty 1

## 2020-01-31 MED ORDER — CARVEDILOL 3.125 MG PO TABS
3.1250 mg | ORAL_TABLET | Freq: Two times a day (BID) | ORAL | Status: DC
  Administered 2020-02-01: 3.125 mg via ORAL
  Filled 2020-01-31: qty 1

## 2020-01-31 MED ORDER — ONDANSETRON HCL 4 MG/2ML IJ SOLN
INTRAMUSCULAR | Status: DC | PRN
  Administered 2020-01-31: 4 mg via INTRAVENOUS

## 2020-01-31 MED ORDER — ESCITALOPRAM OXALATE 20 MG PO TABS
20.0000 mg | ORAL_TABLET | Freq: Every day | ORAL | Status: DC
  Administered 2020-01-31 – 2020-02-01 (×2): 20 mg via ORAL
  Filled 2020-01-31 (×2): qty 1

## 2020-01-31 MED ORDER — PHENYLEPHRINE 40 MCG/ML (10ML) SYRINGE FOR IV PUSH (FOR BLOOD PRESSURE SUPPORT)
PREFILLED_SYRINGE | INTRAVENOUS | Status: DC | PRN
  Administered 2020-01-31: 80 ug via INTRAVENOUS
  Administered 2020-01-31 (×2): 120 ug via INTRAVENOUS
  Administered 2020-01-31: 80 ug via INTRAVENOUS

## 2020-01-31 MED ORDER — ROSUVASTATIN CALCIUM 5 MG PO TABS
10.0000 mg | ORAL_TABLET | Freq: Every day | ORAL | Status: DC
  Administered 2020-01-31 – 2020-02-01 (×2): 10 mg via ORAL
  Filled 2020-01-31: qty 2

## 2020-01-31 MED ORDER — SUCRALFATE 1 G PO TABS
1.0000 g | ORAL_TABLET | Freq: Three times a day (TID) | ORAL | Status: DC
  Administered 2020-01-31 – 2020-02-01 (×3): 1 g via ORAL
  Filled 2020-01-31 (×5): qty 1

## 2020-01-31 MED ORDER — LACTATED RINGERS IV SOLN: INTRAVENOUS | Status: DC

## 2020-01-31 MED ORDER — OXYBUTYNIN CHLORIDE 5 MG PO TABS
5.0000 mg | ORAL_TABLET | Freq: Every day | ORAL | Status: DC
  Administered 2020-01-31: 5 mg via ORAL
  Filled 2020-01-31 (×2): qty 1

## 2020-01-31 MED ORDER — ASPIRIN 81 MG PO CHEW
81.0000 mg | CHEWABLE_TABLET | Freq: Two times a day (BID) | ORAL | Status: DC
  Administered 2020-01-31 – 2020-02-01 (×2): 81 mg via ORAL
  Filled 2020-01-31 (×2): qty 1

## 2020-01-31 MED ORDER — HYDROMORPHONE HCL 1 MG/ML IJ SOLN
INTRAMUSCULAR | Status: AC
  Filled 2020-01-31: qty 1

## 2020-01-31 MED ORDER — OXYCODONE HCL 5 MG PO TABS
5.0000 mg | ORAL_TABLET | ORAL | Status: DC | PRN
  Administered 2020-01-31 – 2020-02-01 (×5): 10 mg via ORAL
  Filled 2020-01-31 (×5): qty 2

## 2020-01-31 MED ORDER — ONDANSETRON HCL 4 MG/2ML IJ SOLN: 4.0000 mg | Freq: Four times a day (QID) | INTRAMUSCULAR | Status: DC | PRN

## 2020-01-31 MED ORDER — MENTHOL 3 MG MT LOZG: 1.0000 | LOZENGE | OROMUCOSAL | Status: DC | PRN

## 2020-01-31 MED ORDER — SODIUM CHLORIDE 0.9 % IR SOLN
Status: DC | PRN
  Administered 2020-01-31: 3000 mL

## 2020-01-31 MED ORDER — ONDANSETRON HCL 4 MG/2ML IJ SOLN: 4.0000 mg | Freq: Once | INTRAMUSCULAR | Status: DC | PRN

## 2020-01-31 MED ORDER — DOCUSATE SODIUM 100 MG PO CAPS
100.0000 mg | ORAL_CAPSULE | Freq: Two times a day (BID) | ORAL | Status: DC
  Administered 2020-01-31 – 2020-02-01 (×2): 100 mg via ORAL
  Filled 2020-01-31 (×2): qty 1

## 2020-01-31 MED ORDER — BUSPIRONE HCL 10 MG PO TABS
10.0000 mg | ORAL_TABLET | Freq: Two times a day (BID) | ORAL | Status: DC
  Administered 2020-01-31 – 2020-02-01 (×2): 10 mg via ORAL
  Filled 2020-01-31 (×3): qty 1

## 2020-01-31 MED ORDER — KETOROLAC TROMETHAMINE 15 MG/ML IJ SOLN
INTRAMUSCULAR | Status: AC
  Filled 2020-01-31: qty 1

## 2020-01-31 MED ORDER — FENTANYL CITRATE (PF) 100 MCG/2ML IJ SOLN: 25.0000 ug | INTRAMUSCULAR | Status: DC | PRN

## 2020-01-31 MED ORDER — TRANEXAMIC ACID-NACL 1000-0.7 MG/100ML-% IV SOLN
INTRAVENOUS | Status: AC
  Filled 2020-01-31: qty 100

## 2020-01-31 MED ORDER — FLUTICASONE PROPIONATE 50 MCG/ACT NA SUSP
1.0000 | Freq: Every day | NASAL | Status: DC
  Administered 2020-01-31: 1 via NASAL
  Filled 2020-01-31: qty 16

## 2020-01-31 MED ORDER — HYDRALAZINE HCL 10 MG PO TABS: 25.0000 mg | ORAL_TABLET | Freq: Three times a day (TID) | ORAL | Status: DC | PRN

## 2020-01-31 MED ORDER — RISAQUAD PO CAPS
1.0000 | ORAL_CAPSULE | Freq: Every day | ORAL | Status: DC
  Administered 2020-01-31 – 2020-02-01 (×2): 1 via ORAL
  Filled 2020-01-31 (×2): qty 1

## 2020-01-31 MED ORDER — SODIUM CHLORIDE 0.9 % IV SOLN: INTRAVENOUS | Status: DC

## 2020-01-31 MED ORDER — FLUTICASONE FUROATE-VILANTEROL 200-25 MCG/INH IN AEPB
1.0000 | INHALATION_SPRAY | Freq: Every day | RESPIRATORY_TRACT | Status: DC
  Filled 2020-01-31: qty 28

## 2020-01-31 MED ORDER — METHOCARBAMOL 1000 MG/10ML IJ SOLN
500.0000 mg | Freq: Four times a day (QID) | INTRAVENOUS | Status: DC | PRN
  Filled 2020-01-31: qty 5

## 2020-01-31 MED ORDER — CEFAZOLIN SODIUM-DEXTROSE 1-4 GM/50ML-% IV SOLN
1.0000 g | Freq: Four times a day (QID) | INTRAVENOUS | Status: AC
  Administered 2020-01-31 – 2020-02-01 (×2): 1 g via INTRAVENOUS
  Filled 2020-01-31 (×2): qty 50

## 2020-01-31 MED ORDER — PROPOFOL 10 MG/ML IV BOLUS
INTRAVENOUS | Status: DC | PRN
  Administered 2020-01-31 (×2): 20 mg via INTRAVENOUS
  Administered 2020-01-31: 150 mg via INTRAVENOUS

## 2020-01-31 MED ORDER — DULOXETINE HCL 30 MG PO CPEP
60.0000 mg | ORAL_CAPSULE | Freq: Every day | ORAL | Status: DC
  Administered 2020-01-31: 60 mg via ORAL
  Filled 2020-01-31: qty 2

## 2020-01-31 MED ORDER — FENTANYL CITRATE (PF) 250 MCG/5ML IJ SOLN
INTRAMUSCULAR | Status: AC
  Filled 2020-01-31: qty 5

## 2020-01-31 MED ORDER — SUGAMMADEX SODIUM 200 MG/2ML IV SOLN
INTRAVENOUS | Status: DC | PRN
  Administered 2020-01-31: 150 mg via INTRAVENOUS

## 2020-01-31 MED ORDER — TRANEXAMIC ACID-NACL 1000-0.7 MG/100ML-% IV SOLN
1000.0000 mg | INTRAVENOUS | Status: AC
  Administered 2020-01-31: 1000 mg via INTRAVENOUS

## 2020-01-31 MED ORDER — ROCURONIUM BROMIDE 10 MG/ML (PF) SYRINGE
PREFILLED_SYRINGE | INTRAVENOUS | Status: DC | PRN
  Administered 2020-01-31: 60 mg via INTRAVENOUS

## 2020-01-31 SURGICAL SUPPLY — 54 items
APL SKNCLS STERI-STRIP NONHPOA (GAUZE/BANDAGES/DRESSINGS) ×1
BENZOIN TINCTURE PRP APPL 2/3 (GAUZE/BANDAGES/DRESSINGS) ×2 IMPLANT
BLADE SAW SGTL 18X1.27X75 (BLADE) ×2 IMPLANT
COVER SURGICAL LIGHT HANDLE (MISCELLANEOUS) ×2 IMPLANT
COVER WAND RF STERILE (DRAPES) ×2 IMPLANT
CUP SECTOR GRIPTON 50MM (Cup) ×1 IMPLANT
DRAPE C-ARM 42X72 X-RAY (DRAPES) ×2 IMPLANT
DRAPE STERI IOBAN 125X83 (DRAPES) ×2 IMPLANT
DRAPE U-SHAPE 47X51 STRL (DRAPES) ×6 IMPLANT
DRSG AQUACEL AG ADV 3.5X10 (GAUZE/BANDAGES/DRESSINGS) ×2 IMPLANT
DRSG XEROFORM 1X8 (GAUZE/BANDAGES/DRESSINGS) ×1 IMPLANT
DURAPREP 26ML APPLICATOR (WOUND CARE) ×2 IMPLANT
ELECT BLADE 4.0 EZ CLEAN MEGAD (MISCELLANEOUS) ×2
ELECT BLADE 6.5 EXT (BLADE) IMPLANT
ELECT REM PT RETURN 9FT ADLT (ELECTROSURGICAL) ×2
ELECTRODE BLDE 4.0 EZ CLN MEGD (MISCELLANEOUS) ×1 IMPLANT
ELECTRODE REM PT RTRN 9FT ADLT (ELECTROSURGICAL) ×1 IMPLANT
FACESHIELD WRAPAROUND (MASK) ×4 IMPLANT
FACESHIELD WRAPAROUND OR TEAM (MASK) ×2 IMPLANT
GLOVE BIOGEL PI IND STRL 8 (GLOVE) ×2 IMPLANT
GLOVE BIOGEL PI INDICATOR 8 (GLOVE) ×2
GLOVE ECLIPSE 8.0 STRL XLNG CF (GLOVE) ×2 IMPLANT
GLOVE ORTHO TXT STRL SZ7.5 (GLOVE) ×4 IMPLANT
GOWN STRL REUS W/ TWL LRG LVL3 (GOWN DISPOSABLE) ×2 IMPLANT
GOWN STRL REUS W/ TWL XL LVL3 (GOWN DISPOSABLE) ×2 IMPLANT
GOWN STRL REUS W/TWL LRG LVL3 (GOWN DISPOSABLE) ×4
GOWN STRL REUS W/TWL XL LVL3 (GOWN DISPOSABLE) ×4
HANDPIECE INTERPULSE COAX TIP (DISPOSABLE) ×2
HEAD FEMORAL 32 CERAMIC (Hips) ×1 IMPLANT
KIT BASIN OR (CUSTOM PROCEDURE TRAY) ×2 IMPLANT
KIT TURNOVER KIT B (KITS) ×2 IMPLANT
LINER ACETABULAR 32X50 (Liner) ×1 IMPLANT
MANIFOLD NEPTUNE II (INSTRUMENTS) ×2 IMPLANT
NS IRRIG 1000ML POUR BTL (IV SOLUTION) ×2 IMPLANT
PACK TOTAL JOINT (CUSTOM PROCEDURE TRAY) ×2 IMPLANT
PAD ARMBOARD 7.5X6 YLW CONV (MISCELLANEOUS) ×2 IMPLANT
SET HNDPC FAN SPRY TIP SCT (DISPOSABLE) ×1 IMPLANT
STAPLER VISISTAT 35W (STAPLE) IMPLANT
STEM FEM ACTIS HIGH SZ2 (Stem) ×1 IMPLANT
STRIP CLOSURE SKIN 1/2X4 (GAUZE/BANDAGES/DRESSINGS) ×4 IMPLANT
SUT ETHIBOND NAB CT1 #1 30IN (SUTURE) ×2 IMPLANT
SUT MNCRL AB 4-0 PS2 18 (SUTURE) IMPLANT
SUT VIC AB 0 CT1 27 (SUTURE) ×2
SUT VIC AB 0 CT1 27XBRD ANBCTR (SUTURE) ×1 IMPLANT
SUT VIC AB 1 CT1 27 (SUTURE) ×2
SUT VIC AB 1 CT1 27XBRD ANBCTR (SUTURE) ×1 IMPLANT
SUT VIC AB 2-0 CT1 27 (SUTURE) ×2
SUT VIC AB 2-0 CT1 TAPERPNT 27 (SUTURE) ×1 IMPLANT
TOWEL GREEN STERILE (TOWEL DISPOSABLE) ×2 IMPLANT
TOWEL GREEN STERILE FF (TOWEL DISPOSABLE) ×2 IMPLANT
TRAY CATH 16FR W/PLASTIC CATH (SET/KITS/TRAYS/PACK) IMPLANT
TRAY FOLEY W/BAG SLVR 16FR (SET/KITS/TRAYS/PACK)
TRAY FOLEY W/BAG SLVR 16FR ST (SET/KITS/TRAYS/PACK) IMPLANT
WATER STERILE IRR 1000ML POUR (IV SOLUTION) ×4 IMPLANT

## 2020-01-31 NOTE — H&P (Signed)
TOTAL HIP ADMISSION H&P  Patient is admitted for left total hip arthroplasty.  Subjective:  Chief Complaint: left hip pain  HPI: [name], 64 y.o. female, has a history of pain and functional disability in the left hip(s) due to arthritis and patient has failed non-surgical conservative treatments for greater than 12 weeks to include NSAID's and/or analgesics, corticosteriod injections, flexibility and strengthening excercises, use of assistive devices and activity modification.  Onset of symptoms was gradual starting 3 years ago with gradually worsening course since that time.The patient noted no past surgery on the left hip(s).  Patient currently rates pain in the left hip at 10 out of 10 with activity. Patient has night pain, worsening of pain with activity and weight bearing, pain that interfers with activities of daily living and pain with passive range of motion. Patient has evidence of subchondral sclerosis, periarticular osteophytes and joint space narrowing by imaging studies. This condition presents safety issues increasing the risk of falls.  There is no current active infection.  Patient Active Problem List   Diagnosis Date Noted  . Left ear pain 01/10/2020  . Non-recurrent acute serous otitis media of both ears 01/10/2020  . Dysuria 01/10/2020  . Unilateral primary osteoarthritis, left hip 12/21/2019  . S/P shoulder replacement, right 11/15/2019  . Abnormal blood sugar 09/29/2019  . Overweight (BMI 25.0-29.9) 04/15/2019  . Sigmoid diverticulitis 04/14/2019  . Chronic diastolic CHF (congestive heart failure) (HCC) 04/14/2019  . Chest pain 01/17/2019  . Unstable angina (HCC) 10/20/2017  . Chronic pain 09/23/2016  . Pre-diabetes 09/02/2016  . Recurrent major depressive disorder, in partial remission (HCC) 07/14/2016  . Seizures (HCC) 07/14/2016  . GAD (generalized anxiety disorder) 07/14/2016  . Insomnia 09/05/2014  . Allergic rhinitis 09/05/2014  . Cervical  spondylosis without myelopathy 12/19/2013  . Neural foraminal stenosis of cervical spine 12/19/2013  . Cervical radiculitis 09/19/2013  . Lumbosacral spondylosis without myelopathy 11/16/2012  . Postlaminectomy syndrome, lumbar region 11/16/2012  . Herpes simplex virus (HSV) infection 10/31/2008  . Hyperlipidemia with target LDL less than 100 10/31/2008  . Essential hypertension 10/31/2008  . GERD 10/31/2008  . SPINAL STENOSIS OF LUMBAR REGION 10/31/2008  . Myalgia 10/31/2008  . Osteoporosis 10/31/2008  . SPONDYLOLISTHESIS 10/31/2008  . SYNCOPE 10/31/2008  . Sleep apnea 10/31/2008   Past Medical History:  Diagnosis Date  . Anemia   . Anginal pain (HCC)    last time   . Anxiety   . Arthritis    RHEUMATOID  . Asthma   . Bipolar 1 disorder (HCC)   . Cataracts, bilateral 07/2017  . CHF (congestive heart failure) (HCC)   . COPD (chronic obstructive pulmonary disease) (HCC)   . Coronary artery disease    reported hx of "MI";  Echo 2009 with normal LVF;  Myoview 05/2011: no ischemia  . Depression   . Diabetes mellitus without complication (HCC)   . Dyslipidemia   . Dysrhythmia    SVT  . Esophageal stricture   . Fibromyalgia   . GERD (gastroesophageal reflux disease)   . H/O hiatal hernia   . Head injury, unspecified   . Headache    migraines  . Herpes simplex infection   . History of kidney stones   . History of loop recorder    Managed by Dr. Sharrell Ku  . Hyperlipidemia   . Hypertension   . Insomnia   . Myocardial infarction [facility])    age 45  . Osteoporosis   . Pneumonia    hx  .  Seizures (HCC)   . Shortness of breath   . Sleep apnea    ? neg  . Spinal stenosis of lumbar region   . Spondylolisthesis   . Status post placement of implantable loop recorder   . Supraventricular tachycardia (HCC)   . Syncope and collapse    s/p ILR; no arhythmogenic cause identified  . UTI (lower urinary tract infection)     Past Surgical History:  Procedure Laterality Date   . BACK SURGERY    . BREAST SURGERY     lumpectomy  . CARDIAC CATHETERIZATION  10/06/2011  . CATARACT EXTRACTION W/PHACO Right 07/31/2017   Procedure: CATARACT EXTRACTION PHACO AND INTRAOCULAR LENS PLACEMENT (IOC);  Surgeon: Fabio Pierce, MD;  Location: [facility];  Service: Ophthalmology;  Laterality: Right;  CDE: 2.33  . CATARACT EXTRACTION W/PHACO Left 08/14/2017   Procedure: CATARACT EXTRACTION PHACO AND INTRAOCULAR LENS PLACEMENT (IOC);  Surgeon: Fabio Pierce, MD;  Location: [facility];  Service: Ophthalmology;  Laterality: Left;  CDE: 2.74  . CHOLECYSTECTOMY    . CYSTOSCOPY     stone  . DIAGNOSTIC LAPAROSCOPY     laparoscopic cholecystectomy  . DOPPLER ECHOCARDIOGRAPHY  2009  . EYE SURGERY    . head up tilt table testing  06/15/2007   [name]  . HEMORRHOID SURGERY    . insertion of implatable loop recorder  08/11/2007   [name]  . POSTERIOR CERVICAL FUSION/FORAMINOTOMY N/A 12/19/2013   Procedure: RIGHT C3-4.C4-5 AND C5-6 FORAMINOTOMIES;  Surgeon: Kerrin Champagne, MD;  Location[facility] OR;  Service: Orthopedics;  Laterality: N/A;  . TOTAL SHOULDER ARTHROPLASTY Right 11/15/2019   Procedure: RIGHT TOTAL SHOULDER ARTHROPLASTY;  Surgeon: Cammy Copa, MD;  Location: [facility] ORS;  Service: Orthopedics;  Laterality: Right;  . TUBAL LIGATION      Current Facility-Administered Medications  Medication Dose Route Frequency Provider Last Rate Last Admin  . ceFAZolin (ANCEF) 2-4 GM/100ML-% IVPB           . [START ON 02/01/2020] ceFAZolin (ANCEF) IVPB 2g/100 mL premix  2 g Intravenous On Call to OR Kirtland Bouchard, PA-C      . lactated ringers infusion   Intravenous Continuous Eilene Ghazi, MD 10 mL/hr at 01/31/20 1310 New Bag at 01/31/20 1310  . povidone-iodine 10 % swab 2 application  2 application Topical Once Richardean Canal W, PA-C      . tranexamic acid (CYKLOKAPRON) 1000MG /163mL IVPB           . tranexamic acid (CYKLOKAPRON) IVPB 1,000 mg  1,000 mg Intravenous To OR 80m, PA-C        Allergies  Allergen Reactions  . Codeine Other (See Comments)    "I will have a heart attack."  . Morphine And Related Other (See Comments)    "It will cause me to have a heart attack."  . Ambien [Zolpidem Tartrate] Nausea And Vomiting  . Clonidine Derivatives Nausea And Vomiting    gerd - caused acid reflux per pt  . Metformin And Related     Nausea/vomiting/cramping from metformin  . Lyrica [Pregabalin] Swelling and Other (See Comments)    Weight gain  . Neurontin [Gabapentin] Other (See Comments)    Causes elevated LFTs     Social History   Tobacco Use  . Smoking status: Never Smoker  . Smokeless tobacco: Never Used  Substance Use Topics  . Alcohol use: No    Alcohol/week: 0.0 standard drinks    Family History  Problem Relation Age of  Onset  . Heart attack Father   . Cancer Father   . Mental illness Father   . Cancer Mother   . Mental illness Mother   . Heart attack Brother        stents  . Alcohol abuse Brother   . Heart disease Brother   . Drug abuse Brother   . Diabetes Brother   . Colon cancer Maternal Aunt   . Cirrhosis Brother   . Stomach cancer Neg Hx      Review of Systems  All other systems reviewed and are negative.   Objective:  Physical Exam Vitals reviewed.  Constitutional:      Appearance: Normal appearance.  HENT:     Head: Normocephalic and atraumatic.  Eyes:     Extraocular Movements: Extraocular movements intact.  Cardiovascular:     Rate and Rhythm: Normal rate and regular rhythm.  Pulmonary:     Effort: Pulmonary effort is normal.  Abdominal:     Palpations: Abdomen is soft.  Musculoskeletal:     Cervical back: Normal range of motion.     Left hip: Tenderness and bony tenderness present. Decreased range of motion. Decreased strength.  Neurological:     Mental Status: She is alert and oriented to person, place, and time.  Psychiatric:        Behavior: Behavior normal.     Vital signs in last 24 hours: Temp:  [97.8  F (36.6 C)] 97.8 F (36.6 C) (11/02 1228) Pulse Rate:  [75-104] 75 (11/02 1331) Resp:  [18] 18 (11/02 1228) BP: (126)/(72) 126/72 (11/02 1228) SpO2:  [97 %] 97 % (11/02 1228) Weight:  [67.1 kg] 67.1 kg (11/02 1228)  Labs:   Estimated body mass index is 27.07 kg/m as calculated from the following:   Height as of this encounter: 5\' 2"  (1.575 m).   Weight as of this encounter: 67.1 kg.   Imaging Review Plain radiographs demonstrate severe degenerative joint disease of the left hip(s). The bone quality appears to be good for age and reported activity level.      Assessment/Plan:  End stage arthritis, left hip(s)  The patient history, physical examination, clinical judgement of the provider and imaging studies are consistent with end stage degenerative joint disease of the left hip(s) and total hip arthroplasty is deemed medically necessary. The treatment options including medical management, injection therapy, arthroscopy and arthroplasty were discussed at length. The risks and benefits of total hip arthroplasty were presented and reviewed. The risks due to aseptic loosening, infection, stiffness, dislocation/subluxation,  thromboembolic complications and other imponderables were discussed.  The patient acknowledged the explanation, agreed to proceed with the plan and consent was signed. Patient is being admitted for inpatient treatment for surgery, pain control, PT, OT, prophylactic antibiotics, VTE prophylaxis, progressive ambulation and ADL's and discharge planning.The patient is planning to be discharged home with home health services

## 2020-01-31 NOTE — Telephone Encounter (Signed)
Patient called in wanting to know where does she get hospital bed from , says she has to be at hospital @12  , wanted to know is there a number for her to call or is she going to receive a call when its ready

## 2020-01-31 NOTE — Brief Op Note (Signed)
01/31/2020  4:02 PM  PATIENT:  [name]  64 y.o. female  PRE-OPERATIVE DIAGNOSIS:  osteoarthritis left hip  POST-OPERATIVE DIAGNOSIS:  osteoarthritis left hip  PROCEDURE:  Procedure(s): LEFT TOTAL HIP ARTHROPLASTY ANTERIOR APPROACH (Left)  SURGEON:  Surgeon(s) and Role:    Kathryne Hitch, MD - Primary  PHYSICIAN ASSISTANT:  Rexene Edison, PA-C  ANESTHESIA:   general  EBL:  400 mL   COUNTS:  YES  DICTATION: .Other Dictation: Dictation Number [id number]  PLAN OF CARE: Admit for overnight observation  PATIENT DISPOSITION:  PACU - hemodynamically stable.   Delay start of Pharmacological VTE agent (>24hrs) due to surgical blood loss or risk of bleeding: no

## 2020-01-31 NOTE — Telephone Encounter (Signed)
Faxed notes and reason why she needs bed

## 2020-01-31 NOTE — Anesthesia Procedure Notes (Signed)
Procedure Name: Intubation Date/Time: 01/31/2020 2:44 PM Performed by: Lovie Chol, CRNA Pre-anesthesia Checklist: Patient identified, Emergency Drugs available, Suction available and Patient being monitored Patient Re-evaluated:Patient Re-evaluated prior to induction Oxygen Delivery Method: Circle System Utilized Preoxygenation: Pre-oxygenation with 100% oxygen Induction Type: IV induction Ventilation: Mask ventilation without difficulty Laryngoscope Size: Miller and 2 Grade View: Grade I Tube type: Oral Tube size: 7.0 mm Number of attempts: 1 Airway Equipment and Method: Stylet and Oral airway Placement Confirmation: ETT inserted through vocal cords under direct vision,  positive ETCO2 and breath sounds checked- equal and bilateral Secured at: 20 cm Tube secured with: Tape Dental Injury: Teeth and Oropharynx as per pre-operative assessment

## 2020-01-31 NOTE — Telephone Encounter (Signed)
[name] team called requesting orders  Office notes diagnoses and need for apt.  for the hospital bed they also needed a marritive   She said call and she will explain

## 2020-01-31 NOTE — Op Note (Signed)
NAME: [name], [name] ACCOUNT [id number] DATE OF BIRTH:Jun 06, 1955 FACILITY: [facility] LOCATION: [facility] PHYSICIAN:Tony Friscia Aretha Parrot, MD  OPERATIVE REPORT  DATE OF PROCEDURE:  01/31/2020  PREOPERATIVE DIAGNOSES:  Primary osteoarthritis and degenerative joint disease, left hip.  POSTOPERATIVE DIAGNOSES:  Primary osteoarthritis and degenerative joint disease, left hip.  PROCEDURE:  Left total hip arthroplasty through direct anterior approach.  IMPLANTS:  DePuy Sector Gription acetabular component size 50, size 32+0 neutral polyethylene liner, size 2 ACTIS femoral component with high offset, size 32+1 ceramic hip ball.  SURGEON:  Vanita Panda. Magnus Ivan, MD  ASSISTANT:  Richardean Canal, PA-C  ANESTHESIA:  General.  BLOOD LOSS:  400 mL.  ANTIBIOTICS:  Two grams IV Ancef.  COMPLICATIONS:  None.  INDICATIONS:  The patient is a 64 year old female with debilitating arthritis involving her left hip.  This has been well documented with clinical exam and x-rays.  She has basically bone-on-bone wear on this hip.  Fortunately, there is no leg length  discrepancy.  She has tried and failed all forms of conservative treatment.  She has had spine surgery and is on chronic narcotic pain medication as well.  At this point, her left hip pain is daily and it is 10/10.  It is detrimentally affecting her  mobility, her quality of life and activities of daily living to the point she does wish to proceed with total hip arthroplasty.  She understands that there is certainly going to be significant pain with any kind of joint replacement surgery in someone  who is on chronic pain medicine.  There is also heightened risk of acute blood loss anemia, nerve and vessel injury, fracture, infection, DVT, dislocation as well as implant failure.  She understands our goals are to decrease pain, improve mobility and  overall improve quality of life.  DESCRIPTION OF PROCEDURE:  After  informed consent was obtained and appropriate left hip was marked, she was brought to the operating room.  General anesthesia was obtained while she was on her stretcher.  We placed traction boots on both her feet and  placed her supine on the Hana fracture table, the perineal post in place and both legs in line skeletal traction device and no traction applied.  Her left operative hip was prepped and draped with DuraPrep and sterile drapes.  A timeout was called and  she was identified as correct patient, correct left hip, then made incision just inferior and posterior to the anterior superior iliac spine and carried this obliquely down the leg.  We dissected down tensor fascia lata muscle.  Tensor fascia was then  divided longitudinally to proceed with direct anterior approach to the hip.  We identified and cauterized circumflex vessels and identified the hip capsule, opened the hip capsule in an L-type format, finding very large joint effusion and significant  periarticular osteophytes around the femoral head and neck.  The femoral neck was very thickened.  I placed Cobra retractors within the joint capsule around the medial and lateral femoral neck and made our femoral neck cut with an oscillating saw just  proximal to the lesser trochanter and completed this with an osteotome.  I placed a corkscrew guide in the femoral head and removed the femoral head in its entirety and found a wide area devoid of cartilage and very hard and polished from significant  osteoarthritis and years of wear.  We then placed a bent Hohmann over the medial acetabular rim and removed remnants of the acetabular labrum and other debris.  I then began reaming under direct visualization from a size 44 reamer in stepwise increments,  going up to a size 49 with all reamers under direct visualization, the last reamer was placed under direct fluoroscopy, so we could obtain our depth of reaming, our inclination and anteversion.  I then  placed the real DePuy Sector Gription acetabular  component size 50 with a 32+0 neutral polyethylene liner based on her offset.  Attention was then turned to the femur.  With the leg externally rotated to 120 degrees, extended and adducted, we are able to place a Mueller retractor medially and Hohmann  retractor behind the greater trochanter.    We released lateral joint capsule and used a box-cutting osteotome to enter the femoral canal and a rongeur to lateralize, then began broaching using the ACTIS broaching system from a size 0 going up to a size  2.  She had a very tight femoral canal.  With a size 2, we trialed the standard offset femoral neck and a 32+1 hip ball and reduced this in the acetabulum and it reduced very easily.  Based on her x-rays, I felt that a size 2 stem was appropriate because  it filled the canal, but we felt like we could go with a high offset stem.  We dislocated the hip and removed the trial components.  I placed the real high offset ACTIS femoral component size 2 and with a 32+1 ceramic hip ball and reduced this in the  acetabulum and her leg lengths were equal.  Her offset was equal and she had good range of motion and stability on my exam.  We then irrigated the soft tissue with normal saline solution using pulsatile lavage.  We were able to close the joint capsule  with #1 Ethibond suture, followed by #1 Vicryl to close the tensor fascia, 0 Vicryl was used to close deep tissue and 2-0 Vicryl was used to close the subcutaneous tissue.  The skin was closed with staples.  Xeroform and Aquacel dressing was applied.   She was taken off the Hana table, awakened, extubated, and taken to recovery room in stable condition with all final counts being correct.  There were no complications noted.  Of note, Richardean Canal, PA-C, assisted during the entire case and assistance  was crucial for facilitating all aspects of this case.  HN/NUANCE  D:01/31/2020 T:01/31/2020  JOB:[id number]

## 2020-01-31 NOTE — Transfer of Care (Signed)
Immediate Anesthesia Transfer of Care Note  Patient: [name]  Procedure(s) Performed: LEFT TOTAL HIP ARTHROPLASTY ANTERIOR APPROACH (Left Hip)  Patient Location: PACU  Anesthesia Type:General  Level of Consciousness: oriented, drowsy and patient cooperative  Airway & Oxygen Therapy: Patient Spontanous Breathing and Patient connected to face mask oxygen  Post-op Assessment: Report given to RN and Post -op Vital signs reviewed and stable  Post vital signs: Reviewed  Last Vitals:  Vitals Value Taken Time  BP 156/120 01/31/20 1635  Temp 36.5 C 01/31/20 1623  Pulse 94 01/31/20 1635  Resp 24 01/31/20 1635  SpO2 100 % 01/31/20 1635  Vitals shown include unvalidated device data.  Last Pain:  Vitals:   01/31/20 1623  TempSrc:   PainSc: 10-Worst pain ever      Patients Stated Pain Goal: 5 (01/31/20 1324)  Complications: No complications documented.

## 2020-01-31 NOTE — Anesthesia Postprocedure Evaluation (Signed)
Anesthesia Post Note  Patient: [name]  Procedure(s) Performed: LEFT TOTAL HIP ARTHROPLASTY ANTERIOR APPROACH (Left Hip)     Patient location during evaluation: PACU Anesthesia Type: General Level of consciousness: awake and alert Pain management: pain level controlled Vital Signs Assessment: post-procedure vital signs reviewed and stable Respiratory status: spontaneous breathing, nonlabored ventilation, respiratory function stable and patient connected to nasal cannula oxygen Cardiovascular status: blood pressure returned to baseline and stable Postop Assessment: no apparent nausea or vomiting Anesthetic complications: no   No complications documented.  Last Vitals:  Vitals:   01/31/20 1745 01/31/20 1800  BP: (!) 107/56 (!) (P) 106/53  Pulse: 91   Resp: 11   Temp:    SpO2: 98% (P) 99%    Last Pain:  Vitals:   01/31/20 1715  TempSrc:   PainSc: 9                  Caesar Mannella S

## 2020-01-31 NOTE — Telephone Encounter (Signed)
LMOM for patient letting her know we are working on this and they will contact her and deliver the bed to her

## 2020-02-01 ENCOUNTER — Encounter (HOSPITAL_COMMUNITY): Payer: Self-pay | Admitting: Orthopaedic Surgery

## 2020-02-01 DIAGNOSIS — M1612 Unilateral primary osteoarthritis, left hip: Secondary | ICD-10-CM | POA: Diagnosis not present

## 2020-02-01 LAB — CBC
HCT: 25.3 % — ABNORMAL LOW (ref 36.0–46.0)
Hemoglobin: 7.1 g/dL — ABNORMAL LOW (ref 12.0–15.0)
MCH: 21.4 pg — ABNORMAL LOW (ref 26.0–34.0)
MCHC: 28.1 g/dL — ABNORMAL LOW (ref 30.0–36.0)
MCV: 76.2 fL — ABNORMAL LOW (ref 80.0–100.0)
Platelets: 169 10*3/uL (ref 150–400)
RBC: 3.32 MIL/uL — ABNORMAL LOW (ref 3.87–5.11)
RDW: 16.7 % — ABNORMAL HIGH (ref 11.5–15.5)
WBC: 9.4 10*3/uL (ref 4.0–10.5)
nRBC: 0 % (ref 0.0–0.2)

## 2020-02-01 LAB — BASIC METABOLIC PANEL
Anion gap: 8 (ref 5–15)
BUN: 19 mg/dL (ref 8–23)
CO2: 26 mmol/L (ref 22–32)
Calcium: 8.7 mg/dL — ABNORMAL LOW (ref 8.9–10.3)
Chloride: 102 mmol/L (ref 98–111)
Creatinine, Ser: 0.93 mg/dL (ref 0.44–1.00)
GFR, Estimated: >60 mL/min (ref >60)
Glucose, Bld: 138 mg/dL — ABNORMAL HIGH (ref 70–99)
Potassium: 4.2 mmol/L (ref 3.5–5.1)
Sodium: 136 mmol/L (ref 135–145)

## 2020-02-01 LAB — GLUCOSE, CAPILLARY: Glucose-Capillary: 143 mg/dL — ABNORMAL HIGH (ref 70–99)

## 2020-02-01 LAB — PREPARE RBC (CROSSMATCH)

## 2020-02-01 MED ORDER — OXYCODONE HCL 5 MG PO TABS
5.0000 mg | ORAL_TABLET | ORAL | 0 refills | Status: DC | PRN
Start: 2020-02-01 — End: 2020-02-03

## 2020-02-01 MED ORDER — IRON (FERROUS SULFATE) 325 (65 FE) MG PO TABS: 325.0000 mg | ORAL_TABLET | Freq: Three times a day (TID) | ORAL | 0 refills | Status: DC

## 2020-02-01 MED ORDER — SODIUM CHLORIDE 0.9% IV SOLUTION: Freq: Once | INTRAVENOUS | Status: AC

## 2020-02-01 MED ORDER — ASPIRIN 81 MG PO CHEW
81.0000 mg | CHEWABLE_TABLET | Freq: Two times a day (BID) | ORAL | 0 refills | Status: DC
Start: 2020-02-01 — End: 2020-06-07

## 2020-02-01 NOTE — Progress Notes (Signed)
Staff found a bed bug on patient's flannel blanket. EVS notified and protocol was followed.Will continue to monitor

## 2020-02-01 NOTE — Evaluation (Signed)
Physical Therapy Evaluation Patient Details Name: [name] MRN: [id number] DOB: [birth year] Today's Date: 02/01/2020   History of Present Illness  Pt is a 64 y.o. F with significant PMH of right shoulder replacement, CHF, recurrent MDD, seizures, osteoporosis, bipolar disorder, back surgery who presents with left hip primary osteoarthritis now s/p left total hip arthroplasty through direct anterior approach.  Clinical Impression  Prior to admission, pt lives alone, is independent with ADL's and uses a cane intermittently. Pt daughters and granddaughters plan to be available for assist upon discharge. Pt presents with decreased functional mobility secondary to left hip pain, weakness, gait abnormalities and balance deficits. Ambulating 200 feet with a walker at a min guard assist level. Negotiated 3 steps with a left railing, but unable to complete more due to pain. Pt plans to stay on main floor initially; hospital bed has been ordered for home. Initiated education on HEP and written handout provided. See below for recommendations.    Follow Up Recommendations Home health PT;Supervision for mobility/OOB    Equipment Recommendations  Rolling walker with 5" wheels;3in1 (PT);Other (comment) (tub bench)    Recommendations for Other Services       Precautions / Restrictions Precautions Precautions: Fall Restrictions Weight Bearing Restrictions: No      Mobility  Bed Mobility Overal bed mobility: Modified Independent             General bed mobility comments: Able to complete without physical assist    Transfers Overall transfer level: Needs assistance Equipment used: Rolling walker (2 wheeled) Transfers: Sit to/from Stand Sit to Stand: Supervision         General transfer comment: Good power up to stand from edge of bed, supervision for safety  Ambulation/Gait Ambulation/Gait assistance: Min guard Gait Distance (Feet): 200 Feet Assistive device: Rolling walker  (2 wheeled) Gait Pattern/deviations: Step-through pattern;Decreased dorsiflexion - left;Antalgic;Trendelenburg;Decreased weight shift to left Gait velocity: decreased   General Gait Details: Cues for upright posture, neutral left foot positioning, min guard for safety. increased antaglic and Trendelenberg gait pattern with increased distance  Stairs Stairs: Yes Stairs assistance: Min guard Stair Management: One rail Left Number of Stairs: 3 General stair comments: Cues for sequencing, technique, step by step. Min guard for stability. Pt with increased left hip pain with ascending  Wheelchair Mobility    Modified Rankin (Stroke Patients Only)       Balance Overall balance assessment: Needs assistance Sitting-balance support: Feet supported Sitting balance-Leahy Scale: Good     Standing balance support: Bilateral upper extremity supported Standing balance-Leahy Scale: Poor Standing balance comment: reliant on hand support                             Pertinent Vitals/Pain Pain Assessment: Faces Faces Pain Scale: Hurts even more Pain Location: bilateral hips Pain Descriptors / Indicators: Operative site guarding;Grimacing;Aching Pain Intervention(s): Limited activity within patient's tolerance;Monitored during session;Premedicated before session    Home Living Family/patient expects to be discharged to:: Private residence Living Arrangements: Alone Available Help at Discharge: Family;Available PRN/intermittently Type of Home: House Home Access: Level entry     Home Layout: Multi-level Home Equipment: Cane - single point      Prior Function Level of Independence: Independent with assistive device(s)         Comments: Uses cane intermittently     Hand Dominance   Dominant Hand: Right    Extremity/Trunk Assessment   Upper Extremity Assessment Upper Extremity Assessment:  Defer to OT evaluation    Lower Extremity Assessment Lower Extremity  Assessment: RLE deficits/detail;LLE deficits/detail RLE Deficits / Details: WFL LLE Deficits / Details: Able to perform limited heel slide, ankle dorsiflexion 5/5       Communication   Communication: No difficulties  Cognition Arousal/Alertness: Awake/alert Behavior During Therapy: WFL for tasks assessed/performed Overall Cognitive Status: Within Functional Limits for tasks assessed                                        General Comments      Exercises Total Joint Exercises Quad Sets: Both;10 reps;Supine Heel Slides: Both;10 reps;Supine   Assessment/Plan    PT Assessment Patient needs continued PT services  PT Problem List Decreased strength;Decreased activity tolerance;Decreased balance;Decreased mobility;Pain       PT Treatment Interventions DME instruction;Gait training;Stair training;Functional mobility training;Therapeutic activities;Therapeutic exercise;Balance training;Patient/family education    PT Goals (Current goals can be found in the Care Plan section)  Acute Rehab PT Goals Patient Stated Goal: return home PT Goal Formulation: With patient Time For Goal Achievement: 02/15/20 Potential to Achieve Goals: Good    Frequency 7X/week   Barriers to discharge        Co-evaluation               AM-PAC PT "6 Clicks" Mobility  Outcome Measure Help needed turning from your back to your side while in a flat bed without using bedrails?: None Help needed moving from lying on your back to sitting on the side of a flat bed without using bedrails?: None Help needed moving to and from a bed to a chair (including a wheelchair)?: A Little Help needed standing up from a chair using your arms (e.g., wheelchair or bedside chair)?: None Help needed to walk in hospital room?: A Little Help needed climbing 3-5 steps with a railing? : A Little 6 Click Score: 21    End of Session Equipment Utilized During Treatment: Gait belt Activity Tolerance: Patient  tolerated treatment well Patient left: in bed;with call bell/phone within reach Nurse Communication: Mobility status PT Visit Diagnosis: Pain;Unsteadiness on feet (R26.81);Other abnormalities of gait and mobility (R26.89);Difficulty in walking, not elsewhere classified (R26.2) Pain - Right/Left: Left Pain - part of body: Hip    Time: 6063-0160 PT Time Calculation (min) (ACUTE ONLY): 21 min   Charges:   PT Evaluation $PT Eval Moderate Complexity: 1 Mod          [name], PT, DPT Acute Rehabilitation Services Pager [phone] Office [phone]   Norval Morton 02/01/2020, 12:45 PM

## 2020-02-01 NOTE — TOC Initial Note (Signed)
Transition of Care [facility]) - Initial/Assessment Note    Patient Details  Name: [name] MRN: [id number] Date of Birth: September 11, 1955  [facility]) CM/SW Contact:    Mearl Latin, LCSW Phone Number: 02/01/2020, 11:54 AM  Clinical Narrative:                 CSW received consult for possible home health services at time of discharge. CSW spoke with patient regarding PT recommendation of Home Health PT at time of discharge. Patient reported that she would like home health services. Patient has been accepted by [facility] for PT. Patient is having a hospital bed delivered today. CSW confirmed PCP and address with patient. No further questions reported at this time.    Expected Discharge Plan: Home w Home Health Services Barriers to Discharge: No Barriers Identified   Patient Goals and CMS Choice Patient states their goals for this hospitalization and ongoing recovery are:: Return home CMS Medicare.gov Compare Post Acute Care list provided to:: Patient Choice offered to / list presented to : Patient  Expected Discharge Plan and Services Expected Discharge Plan: Home w Home Health Services   Discharge Planning Services: CM Consult Post Acute Care Choice: Home Health Living arrangements for the past 2 months: Single Family Home Expected Discharge Date: 02/01/20                         HH Arranged: PT HH Agency: [facility] Date HH Agency Contacted: 02/01/20 Time HH Agency Contacted: 1154 Representative spoke with at [facility] Agency: Amy  Prior Living Arrangements/Services Living arrangements for the past 2 months: Single Family Home Lives with:: Adult Children Patient language and need for interpreter reviewed:: Yes Do you feel safe going back to the place where you live?: Yes      Need for Family Participation in Patient Care: No (Comment) Care giver support system in place?: Yes (comment) Current home services: DME (hospital bed being delivered  today) Criminal Activity/Legal Involvement Pertinent to Current Situation/Hospitalization: No - Comment as needed  Activities of Daily Living      Permission Sought/Granted Permission sought to share information with : Facility Industrial/product designer granted to share information with : Yes, Verbal Permission Granted     Permission granted to share info w AGENCY: HH        Emotional Assessment Appearance:: Appears stated age Attitude/Demeanor/Rapport: Engaged Affect (typically observed): Accepting, Appropriate Orientation: : Oriented to Self, Oriented to Place, Oriented to  Time, Oriented to Situation Alcohol / Substance Use: Not Applicable Psych Involvement: No (comment)  Admission diagnosis:  Status post total replacement of left hip [Z96.642] Patient Active Problem List   Diagnosis Date Noted  . Status post total replacement of left hip 01/31/2020  . Left ear pain 01/10/2020  . Non-recurrent acute serous otitis media of both ears 01/10/2020  . Dysuria 01/10/2020  . Unilateral primary osteoarthritis, left hip 12/21/2019  . S/P shoulder replacement, right 11/15/2019  . Abnormal blood sugar 09/29/2019  . Overweight (BMI 25.0-29.9) 04/15/2019  . Sigmoid diverticulitis 04/14/2019  . Chronic diastolic CHF (congestive heart failure) (HCC) 04/14/2019  . Chest pain 01/17/2019  . Unstable angina (HCC) 10/20/2017  . Chronic pain 09/23/2016  . Pre-diabetes 09/02/2016  . Recurrent major depressive disorder, in partial remission (HCC) 07/14/2016  . Seizures (HCC) 07/14/2016  . GAD (generalized anxiety disorder) 07/14/2016  . Insomnia 09/05/2014  . Allergic rhinitis 09/05/2014  . Cervical spondylosis without myelopathy 12/19/2013  Class: Chronic  . Neural foraminal stenosis of cervical spine 12/19/2013  . Cervical radiculitis 09/19/2013  . Lumbosacral spondylosis without myelopathy 11/16/2012  . Postlaminectomy syndrome, lumbar region 11/16/2012  . Herpes simplex  virus (HSV) infection 10/31/2008  . Hyperlipidemia with target LDL less than 100 10/31/2008  . Essential hypertension 10/31/2008  . GERD 10/31/2008  . SPINAL STENOSIS OF LUMBAR REGION 10/31/2008  . Myalgia 10/31/2008  . Osteoporosis 10/31/2008  . SPONDYLOLISTHESIS 10/31/2008  . SYNCOPE 10/31/2008  . Sleep apnea 10/31/2008   PCP:  Bennie Pierini, FNP Pharmacy:   [facility] - [place], [place] - [address] [place] [address] Phone: [phone] Fax: [phone]  [facility] [address], [place] - [address] [place] [address] Phone: [phone] Fax: [phone]     Social Determinants of Health (SDOH) Interventions    Readmission Risk Interventions No flowsheet data found.

## 2020-02-01 NOTE — Discharge Instructions (Signed)
INSTRUCTIONS AFTER JOINT REPLACEMENT   o Remove items at home which could result in a fall. This includes throw rugs or furniture in walking pathways o ICE to the affected joint every three hours while awake for 30 minutes at a time, for at least the first 3-5 days, and then as needed for pain and swelling.  Continue to use ice for pain and swelling. You may notice swelling that will progress down to the foot and ankle.  This is normal after surgery.  Elevate your leg when you are not up walking on it.   o Continue to use the breathing machine you got in the hospital (incentive spirometer) which will help keep your temperature down.  It is common for your temperature to cycle up and down following surgery, especially at night when you are not up moving around and exerting yourself.  The breathing machine keeps your lungs expanded and your temperature down.   DIET:  As you were doing prior to hospitalization, we recommend a well-balanced diet.  DRESSING / WOUND CARE / SHOWERING  Keep the surgical dressing until follow up.  The dressing is water proof, so you can shower without any extra covering.  IF THE DRESSING FALLS OFF or the wound gets wet inside, change the dressing with sterile gauze.  Please use good hand washing techniques before changing the dressing.  Do not use any lotions or creams on the incision until instructed by your surgeon.    ACTIVITY  o Increase activity slowly as tolerated, but follow the weight bearing instructions below.   o No driving for 6 weeks or until further direction given by your physician.  You cannot drive while taking narcotics.  o No lifting or carrying greater than 10 lbs. until further directed by your surgeon. o Avoid periods of inactivity such as sitting longer than an hour when not asleep. This helps prevent blood clots.  o You may return to work once you are authorized by your doctor.     WEIGHT BEARING   Weight bearing as tolerated with assist  device (walker, cane, etc) as directed, use it as long as suggested by your surgeon or therapist, typically at least 4-6 weeks.   EXERCISES  Results after joint replacement surgery are often greatly improved when you follow the exercise, range of motion and muscle strengthening exercises prescribed by your doctor. Safety measures are also important to protect the joint from further injury. Any time any of these exercises cause you to have increased pain or swelling, decrease what you are doing until you are comfortable again and then slowly increase them. If you have problems or questions, call your caregiver or physical therapist for advice.   Rehabilitation is important following a joint replacement. After just a few days of immobilization, the muscles of the leg can become weakened and shrink (atrophy).  These exercises are designed to build up the tone and strength of the thigh and leg muscles and to improve motion. Often times heat used for twenty to thirty minutes before working out will loosen up your tissues and help with improving the range of motion but do not use heat for the first two weeks following surgery (sometimes heat can increase post-operative swelling).   These exercises can be done on a training (exercise) mat, on the floor, on a table or on a bed. Use whatever works the best and is most comfortable for you.    Use music or television while you are exercising so that   the exercises are a pleasant break in your day. This will make your life better with the exercises acting as a break in your routine that you can look forward to.   Perform all exercises about fifteen times, three times per day or as directed.  You should exercise both the operative leg and the other leg as well.  Exercises include:   . Quad Sets - Tighten up the muscle on the front of the thigh (Quad) and hold for 5-10 seconds.   . Straight Leg Raises - With your knee straight (if you were given a brace, keep it on),  lift the leg to 60 degrees, hold for 3 seconds, and slowly lower the leg.  Perform this exercise against resistance later as your leg gets stronger.  . Leg Slides: Lying on your back, slowly slide your foot toward your buttocks, bending your knee up off the floor (only go as far as is comfortable). Then slowly slide your foot back down until your leg is flat on the floor again.  . Angel Wings: Lying on your back spread your legs to the side as far apart as you can without causing discomfort.  . Hamstring Strength:  Lying on your back, push your heel against the floor with your leg straight by tightening up the muscles of your buttocks.  Repeat, but this time bend your knee to a comfortable angle, and push your heel against the floor.  You may put a pillow under the heel to make it more comfortable if necessary.   A rehabilitation program following joint replacement surgery can speed recovery and prevent re-injury in the future due to weakened muscles. Contact your doctor or a physical therapist for more information on knee rehabilitation.    CONSTIPATION  Constipation is defined medically as fewer than three stools per week and severe constipation as less than one stool per week.  Even if you have a regular bowel pattern at home, your normal regimen is likely to be disrupted due to multiple reasons following surgery.  Combination of anesthesia, postoperative narcotics, change in appetite and fluid intake all can affect your bowels.   YOU MUST use at least one of the following options; they are listed in order of increasing strength to get the job done.  They are all available over the counter, and you may need to use some, POSSIBLY even all of these options:    Drink plenty of fluids (prune juice may be helpful) and high fiber foods Colace 100 mg by mouth twice a day  Senokot for constipation as directed and as needed Dulcolax (bisacodyl), take with full glass of water  Miralax (polyethylene glycol)  once or twice a day as needed.  If you have tried all these things and are unable to have a bowel movement in the first 3-4 days after surgery call either your surgeon or your primary doctor.    If you experience loose stools or diarrhea, hold the medications until you stool forms back up.  If your symptoms do not get better within 1 week or if they get worse, check with your doctor.  If you experience "the worst abdominal pain ever" or develop nausea or vomiting, please contact the office immediately for further recommendations for treatment.   ITCHING:  If you experience itching with your medications, try taking only a single pain pill, or even half a pain pill at a time.  You can also use Benadryl over the counter for itching or also to   help with sleep.   TED HOSE STOCKINGS:  Use stockings on both legs until for at least 2 weeks or as directed by physician office. They may be removed at night for sleeping.  MEDICATIONS:  See your medication summary on the "After Visit Summary" that nursing will review with you.  You may have some home medications which will be placed on hold until you complete the course of blood thinner medication.  It is important for you to complete the blood thinner medication as prescribed.  PRECAUTIONS:  If you experience chest pain or shortness of breath - call 911 immediately for transfer to the hospital emergency department.   If you develop a fever greater that 101 F, purulent drainage from wound, increased redness or drainage from wound, foul odor from the wound/dressing, or calf pain - CONTACT YOUR SURGEON.                                                   FOLLOW-UP APPOINTMENTS:  If you do not already have a post-op appointment, please call the office for an appointment to be seen by your surgeon.  Guidelines for how soon to be seen are listed in your "After Visit Summary", but are typically between 1-4 weeks after surgery.  OTHER INSTRUCTIONS:   Knee  Replacement:  Do not place pillow under knee, focus on keeping the knee straight while resting. CPM instructions: 0-90 degrees, 2 hours in the morning, 2 hours in the afternoon, and 2 hours in the evening. Place foam block, curve side up under heel at all times except when in CPM or when walking.  DO NOT modify, tear, cut, or change the foam block in any way.   DENTAL ANTIBIOTICS:  In most cases prophylactic antibiotics for Dental procdeures after total joint surgery are not necessary.  Exceptions are as follows:  1. History of prior total joint infection  2. Severely immunocompromised (Organ Transplant, cancer chemotherapy, Rheumatoid biologic meds such as Humera)  3. Poorly controlled diabetes (A1C &gt; 8.0, blood glucose over 200)  If you have one of these conditions, contact your surgeon for an antibiotic prescription, prior to your dental procedure.   MAKE SURE YOU:  . Understand these instructions.  . Get help right away if you are not doing well or get worse.    Thank you for letting us be a part of your medical care team.  It is a privilege we respect greatly.  We hope these instructions will help you stay on track for a fast and full recovery!  Dental Antibiotics:  In most cases prophylactic antibiotics for Dental procdeures after total joint surgery are not necessary.  Exceptions are as follows:  1. History of prior total joint infection  2. Severely immunocompromised (Organ Transplant, cancer chemotherapy, Rheumatoid biologic meds such as Humera)  3. Poorly controlled diabetes (A1C &gt; 8.0, blood glucose over 200)  If you have one of these conditions, contact your surgeon for an antibiotic prescription, prior to your dental procedure. 

## 2020-02-01 NOTE — Evaluation (Signed)
Occupational Therapy Evaluation Patient Details Name: [name] MRN: [id number] DOB: 1956-02-09 Today's Date: 02/01/2020    History of Present Illness Pt is a 64 y.o. F with significant PMH of right shoulder replacement, CHF, recurrent MDD, seizures, osteoporosis, bipolar disorder, back surgery who presents with left hip primary osteoarthritis now s/p left total hip arthroplasty through direct anterior approach.   Clinical Impression   Patient evaluated by Occupational Therapy with no further acute OT needs identified. All education has been completed and the patient has no further questions. See below for any follow-up Occupational Therapy or equipment needs. OT to sign off. Thank you for referral.      Follow Up Recommendations  No OT follow up    Equipment Recommendations  3 in 1 bedside commode;Other (comment) (RW)    Recommendations for Other Services       Precautions / Restrictions Precautions Precautions: Fall Restrictions Weight Bearing Restrictions: No      Mobility Bed Mobility Overal bed mobility: Modified Independent             General bed mobility comments: Able to complete without physical assist    Transfers Overall transfer level: Needs assistance Equipment used: Rolling walker (2 wheeled) Transfers: Sit to/from Stand Sit to Stand: Supervision         General transfer comment: Good power up to stand from edge of bed, supervision for safety    Balance Overall balance assessment: Needs assistance Sitting-balance support: Feet supported Sitting balance-Leahy Scale: Good     Standing balance support: Bilateral upper extremity supported Standing balance-Leahy Scale: Poor Standing balance comment: reliant on hand support                           ADL either performed or assessed with clinical judgement   ADL Overall ADL's : Needs assistance/impaired Eating/Feeding: Independent   Grooming: Supervision/safety                Lower Body Dressing: Modified independent Lower Body Dressing Details (indicate cue type and reason): edcuated on use of reacher and dressing L LE first. Pt plans ot purchase AE and granddaughter reports having Teacher, early years/pre Details (indicate cue type and reason): 3n1 delivered and educated on positioning, raising and removal of back bar for closer fit to the tank of commode. granddaughter present for education       Tub/Shower Transfer Details (indicate cue type and reason): plans to sponge bath and educated on washing around incision and not to use dirty linen twice   General ADL Comments: pt eager to d/c home and reports family can (A) at home     Vision Baseline Vision/History: Wears glasses Wears Glasses: At all times       Perception     Praxis      Pertinent Vitals/Pain Pain Assessment: Faces Faces Pain Scale: Hurts little more Pain Location: surgerical pain Pain Descriptors / Indicators: Operative site guarding;Grimacing;Aching Pain Intervention(s): Premedicated before session;Repositioned     Hand Dominance Right   Extremity/Trunk Assessment Upper Extremity Assessment Upper Extremity Assessment: Overall WFL for tasks assessed   Lower Extremity Assessment Lower Extremity Assessment: RLE deficits/detail;LLE deficits/detail RLE Deficits / Details: WFL LLE Deficits / Details: Able to perform limited heel slide, ankle dorsiflexion 5/5   Cervical / Trunk Assessment Cervical / Trunk Assessment: Normal   Communication Communication Communication: No difficulties   Cognition Arousal/Alertness: Awake/alert Behavior During Therapy: WFL for tasks assessed/performed Overall  Cognitive Status: Within Functional Limits for tasks assessed                                     General Comments       Exercises Exercises: Total Joint Total Joint Exercises Quad Sets: Both;10 reps;Supine Heel Slides: Both;10 reps;Supine    Shoulder Instructions      Home Living Family/patient expects to be discharged to:: Private residence Living Arrangements: Alone Available Help at Discharge: Family;Available PRN/intermittently Type of Home: House Home Access: Level entry     Home Layout: Multi-level Alternate Level Stairs-Number of Steps: 15   Bathroom Shower/Tub: Chief Strategy Officer: Standard     Home Equipment: Cane - single point   Additional Comments: granddaugther and daughter to (A)      Prior Functioning/Environment Level of Independence: Independent with assistive device(s)        Comments: Uses cane intermittently        OT Problem List:        OT Treatment/Interventions:      OT Goals(Current goals can be found in the care plan section) Acute Rehab OT Goals Patient Stated Goal: return home OT Goal Formulation: With patient/family  OT Frequency:     Barriers to D/C:            Co-evaluation              AM-PAC OT "6 Clicks" Daily Activity     Outcome Measure Help from another person eating meals?: None Help from another person taking care of personal grooming?: None Help from another person toileting, which includes using toliet, bedpan, or urinal?: None Help from another person bathing (including washing, rinsing, drying)?: None Help from another person to put on and taking off regular upper body clothing?: None Help from another person to put on and taking off regular lower body clothing?: None 6 Click Score: 24   End of Session Equipment Utilized During Treatment: Rolling walker Nurse Communication: Mobility status;Precautions  Activity Tolerance: Patient tolerated treatment well Patient left: Other (comment) (granddaughter arrived and RN calling transport. on EOB)                   Time: 1430-1445 OT Time Calculation (min): 15 min Charges:  OT General Charges $OT Visit: 1 Visit OT Evaluation $OT Eval Low Complexity: 1 Low  Brynn, OTR/L   Acute Rehabilitation Services Pager: [phone] Office: [phone] .   Mateo Flow 02/01/2020, 3:09 PM

## 2020-02-01 NOTE — Discharge Summary (Signed)
Patient ID: [name] MRN: [id number] DOB/AGE: 06/19/1955 64 y.o.  Admit date: 01/31/2020 Discharge date: 02/01/2020  Admission Diagnoses:  Principal Problem:   Unilateral primary osteoarthritis, left hip Active Problems:   Status post total replacement of left hip   Discharge Diagnoses:  Same  Past Medical History:  Diagnosis Date  . Anemia   . Anginal pain (HCC)    last time   . Anxiety   . Arthritis    RHEUMATOID  . Asthma   . Bipolar 1 disorder (HCC)   . Cataracts, bilateral 07/2017  . CHF (congestive heart failure) (HCC)   . COPD (chronic obstructive pulmonary disease) (HCC)   . Coronary artery disease    reported hx of "MI";  Echo 2009 with normal LVF;  Myoview 05/2011: no ischemia  . Depression   . Diabetes mellitus without complication (HCC)   . Dyslipidemia   . Dysrhythmia    SVT  . Esophageal stricture   . Fibromyalgia   . GERD (gastroesophageal reflux disease)   . H/O hiatal hernia   . Head injury, unspecified   . Headache    migraines  . Herpes simplex infection   . History of kidney stones   . History of loop recorder    Managed by Dr. Sharrell Ku  . Hyperlipidemia   . Hypertension   . Insomnia   . Myocardial infarction [facility])    age 35  . Osteoporosis   . Pneumonia    hx  . Seizures (HCC)   . Shortness of breath   . Sleep apnea    ? neg  . Spinal stenosis of lumbar region   . Spondylolisthesis   . Status post placement of implantable loop recorder   . Supraventricular tachycardia (HCC)   . Syncope and collapse    s/p ILR; no arhythmogenic cause identified  . UTI (lower urinary tract infection)     Surgeries: Procedure(s): LEFT TOTAL HIP ARTHROPLASTY ANTERIOR APPROACH on 01/31/2020   Consultants:   Discharged Condition: Improved  Hospital Course: [name] is an 64 y.o. female who was admitted 01/31/2020 for operative treatment ofUnilateral primary osteoarthritis, left hip. Patient has severe unremitting pain that  affects sleep, daily activities, and work/hobbies. After pre-op clearance the patient was taken to the operating room on 01/31/2020 and underwent  Procedure(s): LEFT TOTAL HIP ARTHROPLASTY ANTERIOR APPROACH.    Patient was given perioperative antibiotics:  Anti-infectives (From admission, onward)   Start     Dose/Rate Route Frequency Ordered Stop   02/01/20 0600  ceFAZolin (ANCEF) IVPB 2g/100 mL premix        2 g 200 mL/hr over 30 Minutes Intravenous On call to O.R. 01/31/20 1223 01/31/20 1514   01/31/20 2200  valACYclovir (VALTREX) tablet 500 mg        500 mg Oral 2 times daily 01/31/20 1811     01/31/20 2030  ceFAZolin (ANCEF) IVPB 1 g/50 mL premix        1 g 100 mL/hr over 30 Minutes Intravenous Every 6 hours 01/31/20 1817 02/01/20 0406   01/31/20 1242  ceFAZolin (ANCEF) 2-4 GM/100ML-% IVPB       Note to Pharmacy: Kathrene Bongo   : cabinet override      01/31/20 1242 01/31/20 1514       Patient was given sequential compression devices, early ambulation, and chemoprophylaxis to prevent DVT.  Patient benefited maximally from hospital stay and there were no complications.  She did have acute blood loss anemia on  top of chronic anemia from the surgery.  She did have a transfusion of 1 unit of blood postop day 1.  Recent vital signs:  Patient Vitals for the past 24 hrs:  BP Temp Temp src Pulse Resp SpO2  02/01/20 1241 112/70 98.8 F (37.1 C) Oral 85 18 96 %  02/01/20 1211 118/61 98.6 F (37 C) Oral 86 18 97 %  02/01/20 1139 (!) 90/58 98 F (36.7 C) Oral 89 18 94 %  02/01/20 1002 (!) 94/56 98.6 F (37 C) Oral 96 19 97 %  02/01/20 0955 (!) 94/56 98.6 F (37 C) Oral 96 19 97 %  02/01/20 0940 (!) 112/54 98.6 F (37 C) Oral 91 19 --  02/01/20 0738 104/68 98.5 F (36.9 C) Oral (!) 102 18 92 %  02/01/20 0337 106/81 98.6 F (37 C) Oral 98 18 93 %  01/31/20 1921 104/69 98.1 F (36.7 C) Oral 99 18 99 %  01/31/20 1824 135/63 98 F (36.7 C) Oral (!) 101 18 99 %  01/31/20 1800 (!)  106/53 97.8 F (36.6 C) -- 95 20 99 %  01/31/20 1745 (!) 107/56 -- -- 91 11 98 %  01/31/20 1730 (!) 130/96 -- -- 91 17 95 %  01/31/20 1715 (!) 128/101 -- -- 92 (!) 25 99 %  01/31/20 1700 (!) 111/91 -- -- 87 (!) 24 98 %     Recent laboratory studies:  Recent Labs    02/01/20 0435  WBC 9.4  HGB 7.1*  HCT 25.3*  PLT 169  NA 136  K 4.2  CL 102  CO2 26  BUN 19  CREATININE 0.93  GLUCOSE 138*  CALCIUM 8.7*     Discharge Medications:   Allergies as of 02/01/2020      Reactions   Codeine Other (See Comments)   "I will have a heart attack."   Morphine And Related Other (See Comments)   "It will cause me to have a heart attack."   Ambien [zolpidem Tartrate] Nausea And Vomiting   Clonidine Derivatives Nausea And Vomiting   gerd - caused acid reflux per pt   Metformin And Related    Nausea/vomiting/cramping from metformin   Lyrica [pregabalin] Swelling, Other (See Comments)   Weight gain   Neurontin [gabapentin] Other (See Comments)   Causes elevated LFTs      Medication List    STOP taking these medications   aspirin 81 MG EC tablet Replaced by: aspirin 81 MG chewable tablet   HYDROcodone-acetaminophen 7.5-325 MG tablet Commonly known as: NORCO     TAKE these medications   acidophilus Caps capsule Take 1 capsule by mouth daily.   acyclovir ointment 5 % Commonly known as: ZOVIRAX Apply topically every 3 (three) hours. What changed:   how much to take  when to take this  reasons to take this   albuterol 108 (90 Base) MCG/ACT inhaler Commonly known as: VENTOLIN HFA INHALE 2 PUFFS EVERY 6 HOURS AS NEEDED FOR SHORTNESS OF BREATH AND WHEEZING. What changed: See the new instructions.   albuterol (2.5 MG/3ML) 0.083% nebulizer solution Commonly known as: PROVENTIL Take 3 mLs (2.5 mg total) by nebulization every 4 (four) hours as needed for wheezing or shortness of breath. What changed: Another medication with the same name was changed. Make sure you understand  how and when to take each.   aspirin 81 MG chewable tablet Chew 1 tablet (81 mg total) by mouth 2 (two) times daily. Replaces: aspirin 81 MG EC tablet  Breo Ellipta 200-25 MCG/INH Aepb Generic drug: fluticasone furoate-vilanterol Inhale 1 puff into the lungs daily.   busPIRone 10 MG tablet Commonly known as: BUSPAR Take 1 tablet (10 mg total) by mouth 2 (two) times daily as needed.   butalbital-acetaminophen-caffeine 50-325-40 MG tablet Commonly known as: FIORICET TAKE 1 TABLET BY MOUTH EVERY 6 HOURS AS NEEDED FOR HEADACHE.   carvedilol 6.25 MG tablet Commonly known as: COREG TAKE 1 1/2 TABLETS BY MOUTH TWICE DAILY.   celecoxib 200 MG capsule Commonly known as: CELEBREX Take 1 capsule (200 mg total) by mouth daily.   chlorzoxazone 500 MG tablet Commonly known as: PARAFON TAKE (1) TABLET EVERY SIX HOURS AS NEEDED FOR MUSCLE SPASMS.   Dexilant 60 MG capsule Generic drug: dexlansoprazole Take 1 capsule (60 mg total) by mouth daily.   diclofenac Sodium 1 % Gel Commonly known as: Voltaren Apply 4 g topically 4 (four) times daily. What changed:   when to take this  reasons to take this   DULoxetine 60 MG capsule Commonly known as: CYMBALTA Take 1 capsule (60 mg total) by mouth at bedtime.   empagliflozin 25 MG Tabs tablet Commonly known as: Jardiance Take 1 tablet (25 mg total) by mouth daily before breakfast.   escitalopram 20 MG tablet Commonly known as: LEXAPRO Take 1 tablet (20 mg total) by mouth daily.   estradiol 0.1 MG/GM vaginal cream Commonly known as: ESTRACE Place 1 Applicatorful vaginally at bedtime.   FIBER PO Take 2 tablets by mouth in the morning, at noon, and at bedtime.   fludrocortisone 0.1 MG tablet Commonly known as: FLORINEF TAKE 1 TABLET EVERY OTHER DAY.   fluticasone 50 MCG/ACT nasal spray Commonly known as: FLONASE Place 1 spray into both nostrils daily.   furosemide 40 MG tablet Commonly known as: LASIX TAKE 1 TABLET ONCE  DAILY AS NEEDED FOR FLUID. What changed:   how much to take  how to take this  when to take this  reasons to take this  additional instructions   Global Alcohol Prep Ease 70 % Pads USE 1 PAD DAILY WHEN CHECKING BLOOD SUGAR. R73.03   hydrALAZINE 25 MG tablet Commonly known as: APRESOLINE Take 1 tablet (25 mg total) by mouth 3 (three) times daily as needed (severe hypertension / systolic number 170 or greater). What changed: reasons to take this   ipratropium 0.02 % nebulizer solution Commonly known as: ATROVENT Take 2.5 mLs (0.5 mg total) by nebulization every 6 (six) hours as needed for wheezing or shortness of breath.   Iron (Ferrous Sulfate) 325 (65 Fe) MG Tabs Take 325 mg by mouth in the morning, at noon, and at bedtime.   isosorbide mononitrate 30 MG 24 hr tablet Commonly known as: IMDUR Take 1 tablet (30 mg total) by mouth daily.   lamoTRIgine 150 MG tablet Commonly known as: LAMICTAL Take 1 tablet (150 mg total) by mouth daily.   levETIRAcetam 500 MG tablet Commonly known as: KEPPRA TAKE (1) TABLET TWICE DAILY.   levocetirizine 5 MG tablet Commonly known as: XYZAL TAKE 1 TABLET BY MOUTH EVERY MORNING. What changed: when to take this   lidocaine 5 % ointment Commonly known as: XYLOCAINE APPLY AFFECTED AREA THREE TIMES DAILY AS NEEDED FOR MILD OR MODERATE PAIN. What changed:   how much to take  how to take this  when to take this  reasons to take this  additional instructions   methocarbamol 500 MG tablet Commonly known as: ROBAXIN TAKE 1 TABLET EVERY 8 HOURS AS  NEEDED FOR MUSCLE SPASMS.   nitroGLYCERIN 0.4 MG SL tablet Commonly known as: NITROSTAT PLACE ONE (1) TABLET UNDER TONGUE EVERY 5 MINUTES UP TO (3) DOSES AS NEEDED FOR CHEST PAIN.   ondansetron 4 MG tablet Commonly known as: ZOFRAN TAKE 1 TABLET BY MOUTH EVERY 8 HOURS AS NEEDED FOR NAUSEA AND VOMITING.   OneTouch Delica Plus Lancet33G Misc USE TO CHECK SUGAR DAILY.   oxybutynin 5  MG tablet Commonly known as: DITROPAN Take 1 tablet (5 mg total) by mouth at bedtime.   oxyCODONE 5 MG immediate release tablet Commonly known as: Oxy IR/ROXICODONE Take 1-2 tablets (5-10 mg total) by mouth every 4 (four) hours as needed for moderate pain (pain score 4-6).   rosuvastatin 10 MG tablet Commonly known as: CRESTOR Take 1 tablet (10 mg total) by mouth daily.   sucralfate 1 g tablet Commonly known as: CARAFATE TAKE 1 TABLET 4 TIMES DAILY WITH MEALS AND AT BEDTIME. What changed:   how much to take  how to take this  when to take this  additional instructions   traZODone 150 MG tablet Commonly known as: DESYREL Take 1 tablet (150 mg total) by mouth at bedtime.   valACYclovir 500 MG tablet Commonly known as: VALTREX Take 1 tablet (500 mg total) by mouth 2 (two) times daily.            Durable Medical Equipment  (From admission, onward)         Start     Ordered   02/01/20 1449  For home use only DME Walker  Once       Question:  Patient needs a walker to treat with the following condition  Answer:  Unsteady gait   02/01/20 1448   02/01/20 1449  For home use only DME Tub bench  Once        02/01/20 1448   02/01/20 1448  For home use only DME 3 n 1  Once        02/01/20 1448   01/31/20 1818  DME Walker rolling  Once       Question Answer Comment  Walker: With 5 Inch Wheels   Patient needs a walker to treat with the following condition Status post total replacement of left hip      01/31/20 1817   01/31/20 1818  DME 3 n 1  Once        01/31/20 1817          Diagnostic Studies: DG Pelvis Portable  Result Date: 01/31/2020 CLINICAL DATA:  Left hip arthroplasty EXAM: PORTABLE PELVIS 1-2 VIEWS COMPARISON:  Intraoperative left hip radiographs dated 01/31/2020 FINDINGS: Left hip arthroplasty in satisfactory position. Overlying skin staples and soft tissue gas. Right hip joint space is preserved. Visualized bony pelvis appears intact. Status post PLIF at  L4-5, incompletely visualized. IMPRESSION: Left hip arthroplasty in satisfactory position. Electronically Signed   By: Charline Bills M.D.   On: 01/31/2020 16:59   DG C-Arm 1-60 Min  Result Date: 01/31/2020 CLINICAL DATA:  Left total hip arthroplasty. EXAM: DG C-ARM 1-60 MIN; OPERATIVE LEFT HIP WITH PELVIS FLUOROSCOPY TIME:  Fluoroscopy Time:  28 seconds Radiation Exposure Index (if provided by the fluoroscopic device): 2.34 mGy Number of Acquired Spot Images: 5 COMPARISON:  None. FINDINGS: Five fluoroscopic spot views obtained in the operating room. Interval left hip arthroplasty in expected alignment. IMPRESSION: Procedural fluoroscopy during left hip arthroplasty. Electronically Signed   By: Narda Rutherford M.D.   On: 01/31/2020 16:25  DG HIP OPERATIVE UNILAT W OR W/O PELVIS LEFT  Result Date: 01/31/2020 CLINICAL DATA:  Left total hip arthroplasty. EXAM: DG C-ARM 1-60 MIN; OPERATIVE LEFT HIP WITH PELVIS FLUOROSCOPY TIME:  Fluoroscopy Time:  28 seconds Radiation Exposure Index (if provided by the fluoroscopic device): 2.34 mGy Number of Acquired Spot Images: 5 COMPARISON:  None. FINDINGS: Five fluoroscopic spot views obtained in the operating room. Interval left hip arthroplasty in expected alignment. IMPRESSION: Procedural fluoroscopy during left hip arthroplasty. Electronically Signed   By: Narda Rutherford M.D.   On: 01/31/2020 16:25    Disposition: Discharge disposition: 01-Home or Self Care          Follow-up Information    Kathryne Hitch, MD Follow up in 2 week(s).   Specialty: Orthopedic Surgery Contact information: [address] [place] [address] [phone]        [facility] Follow up.   Specialty: Home Health Services Why: Home Health PT services arranged and will begin on Friday. Contact information: [address] [place] [address] [phone]                Signed: Kathryne Hitch 02/01/2020, 4:56 PM

## 2020-02-01 NOTE — Plan of Care (Signed)
Patient alert and oriented, mae's well, voiding adequate amount of urine, swallowing without difficulty, no c/o pain at time of discharge. Patient discharged home with family. Script and discharged instructions given to patient. Patient and family stated understanding of instructions given. Patient has an appointment with Dr. Blackman   

## 2020-02-01 NOTE — Progress Notes (Signed)
Subjective: 1 Day Post-Op Procedure(s) (LRB): LEFT TOTAL HIP ARTHROPLASTY ANTERIOR APPROACH (Left) Patient reports pain as moderate.  Acute on chronic blood loss anemia.  Objective: Vital signs in last 24 hours: Temp:  [97.7 F (36.5 C)-98.6 F (37 C)] 98.5 F (36.9 C) (11/03 0738) Pulse Rate:  [75-104] 102 (11/03 0738) Resp:  [11-28] 18 (11/03 0738) BP: (104-154)/(53-101) 104/68 (11/03 0738) SpO2:  [92 %-100 %] 92 % (11/03 0738) Weight:  [67.1 kg] 67.1 kg (11/02 1228)  Intake/Output from previous day: 11/02 0701 - 11/03 0700 In: 1600 [I.V.:1300; IV Piggyback:300] Out: 500 [Urine:100; Blood:400] Intake/Output this shift: No intake/output data recorded.  Recent Labs    02/01/20 0435  HGB 7.1*   Recent Labs    02/01/20 0435  WBC 9.4  RBC 3.32*  HCT 25.3*  PLT 169   Recent Labs    02/01/20 0435  NA 136  K 4.2  CL 102  CO2 26  BUN 19  CREATININE 0.93  GLUCOSE 138*  CALCIUM 8.7*   No results for input(s): LABPT, INR in the last 72 hours.  Sensation intact distally Intact pulses distally Dorsiflexion/Plantar flexion intact Incision: dressing C/D/I   Assessment/Plan: 1 Day Post-Op Procedure(s) (LRB): LEFT TOTAL HIP ARTHROPLASTY ANTERIOR APPROACH (Left) Up with therapy Discharge home with home health this afternoon is clears therapy and vitals stable. Will transfuse one unit of PRBC's      Kathryne Hitch 02/01/2020, 8:02 AM

## 2020-02-01 NOTE — Progress Notes (Signed)
OT Cancellation Note  Patient Details Name: [name] MRN: [id number] DOB: 1956/03/30   Cancelled Treatment:    Reason Eval/Treat Not Completed: Patient at procedure or test/ unavailable Pt starting transfusion so will hold OT session for more appropriate time. RN aware and to call OT upon completion  Wynona Neat, OTR/L  Acute Rehabilitation Services Pager: [phone] Office: [phone] .  02/01/2020, 9:51 AM

## 2020-02-03 ENCOUNTER — Telehealth: Payer: Self-pay | Admitting: Orthopaedic Surgery

## 2020-02-03 LAB — BPAM RBC
Blood Product Expiration Date: 202111232359
ISSUE DATE / TIME: 202111030929
Unit Type and Rh: 6200

## 2020-02-03 LAB — TYPE AND SCREEN
ABO/RH(D): A POS
Antibody Screen: NEGATIVE
Unit division: 0

## 2020-02-03 MED ORDER — OXYCODONE HCL 5 MG PO TABS
5.0000 mg | ORAL_TABLET | ORAL | 0 refills | Status: DC | PRN
Start: 2020-02-03 — End: 2020-02-08

## 2020-02-03 NOTE — Telephone Encounter (Signed)
Pt called and stated understanding  °

## 2020-02-03 NOTE — Telephone Encounter (Signed)
Patient called needing Rx refilled (Oxycodone) Patient said she is having (PT) today and asked if she can get something a little stronger? The number to contact patient is [phone]

## 2020-02-03 NOTE — Telephone Encounter (Signed)
I refilled her oxycodone.  We don't provide anything stronger.  She is on the strongest oral pain meds we provide.

## 2020-02-06 ENCOUNTER — Ambulatory Visit: Payer: 59 | Admitting: Nurse Practitioner

## 2020-02-08 ENCOUNTER — Telehealth: Payer: Self-pay

## 2020-02-08 ENCOUNTER — Telehealth: Payer: Self-pay | Admitting: Orthopaedic Surgery

## 2020-02-08 MED ORDER — OXYCODONE HCL 5 MG PO TABS
5.0000 mg | ORAL_TABLET | Freq: Four times a day (QID) | ORAL | 0 refills | Status: DC | PRN
Start: 2020-02-08 — End: 2020-02-14

## 2020-02-08 NOTE — Telephone Encounter (Signed)
Please advise 

## 2020-02-08 NOTE — Telephone Encounter (Signed)
Mark from [facility] called in saying patient wanted to ask 2 questions   1. She wanted to know if she could get another pair of compression stockings  2. What is the wear schedule of the compression stockings ?

## 2020-02-08 NOTE — Telephone Encounter (Signed)
Does she need these still?

## 2020-02-08 NOTE — Telephone Encounter (Signed)
Patient called. She would like a refill on oxycodone. Her call back number is [phone]

## 2020-02-08 NOTE — Telephone Encounter (Signed)
We usually have them wear compressive TED hose at least the first few weeks after surgery if they are experiencing ankle and foot swelling.  She only needs to wear them if she is still having swelling in her feet and ankle and she can wear them for like 12 hours on 12 hours off.

## 2020-02-08 NOTE — Telephone Encounter (Signed)
Pt informed and stated understanding

## 2020-02-10 ENCOUNTER — Telehealth: Payer: Self-pay | Admitting: Orthopaedic Surgery

## 2020-02-10 ENCOUNTER — Ambulatory Visit: Payer: 59 | Admitting: Nurse Practitioner

## 2020-02-10 NOTE — Telephone Encounter (Signed)
She needs to use her incentive spirometer and cough and deep breathe and that will help with her fevers as well as taken Tylenol.  The leg pain is normal for postoperative hip surgery.

## 2020-02-10 NOTE — Telephone Encounter (Signed)
Pt called to let you know that she is running a fever and the pain I shooting down her leg.   She just wanted to call to let you know.

## 2020-02-10 NOTE — Telephone Encounter (Signed)
I called pt to advise of message below. To call with any other questions.  °

## 2020-02-14 ENCOUNTER — Encounter: Payer: Self-pay | Admitting: Orthopaedic Surgery

## 2020-02-14 ENCOUNTER — Ambulatory Visit (INDEPENDENT_AMBULATORY_CARE_PROVIDER_SITE_OTHER): Payer: 59 | Admitting: Orthopaedic Surgery

## 2020-02-14 DIAGNOSIS — Z96642 Presence of left artificial hip joint: Secondary | ICD-10-CM

## 2020-02-14 MED ORDER — OXYCODONE HCL 5 MG PO TABS
5.0000 mg | ORAL_TABLET | Freq: Four times a day (QID) | ORAL | 0 refills | Status: DC | PRN
Start: 2020-02-14 — End: 2020-02-20

## 2020-02-14 NOTE — Progress Notes (Signed)
The patient is 2 weeks today status post a left total hip arthroplasty.  She has been taking aspirin twice a day.  She is ambulating some but has been using a wheelchair in the clinic today but has been using a walker at home.  She is someone who is been on chronic pain medications prior to surgery so she is having a lot of pain.  On exam the staples been removed and Steri-Strips applied to her left hip incision.  There is a moderate seroma and I did aspirate about 80 cc of fluid from the hip soft tissue area to decompress it.  This gave her more comfort.  She understands this may reaccumulate.  Her leg lengths are equal as well.  She will continue to increase her activities as comfort allows.  I will refill her oxycodone at least 1 more time as she is transitioning back to her chronic hydrocodone.  We will see her back in the office in 4 weeks unless things are not improving and she needs to have the seroma aspirated again.

## 2020-02-16 ENCOUNTER — Telehealth: Payer: Self-pay | Admitting: Orthopaedic Surgery

## 2020-02-16 NOTE — Telephone Encounter (Signed)
Made pt apt on the 22nd at 1

## 2020-02-16 NOTE — Telephone Encounter (Signed)
We can see her next week again to drain more fluid off of her hip.  Tell her to just intermittently ice and occasionally put a heating pad on this area.  This really all that she can do.

## 2020-02-16 NOTE — Telephone Encounter (Signed)
Pt called about her hip surgery.  Pt states where the fluid is hard she just wanted to let you know.   Pt would like for you to call her.

## 2020-02-16 NOTE — Telephone Encounter (Signed)
Can you let her know this for me and make her an appt for next week if she wants it

## 2020-02-20 ENCOUNTER — Ambulatory Visit (INDEPENDENT_AMBULATORY_CARE_PROVIDER_SITE_OTHER): Payer: 59 | Admitting: Orthopaedic Surgery

## 2020-02-20 ENCOUNTER — Encounter: Payer: Self-pay | Admitting: Orthopaedic Surgery

## 2020-02-20 DIAGNOSIS — Z96642 Presence of left artificial hip joint: Secondary | ICD-10-CM

## 2020-02-20 MED ORDER — OXYCODONE HCL 5 MG PO TABS
5.0000 mg | ORAL_TABLET | Freq: Four times a day (QID) | ORAL | 0 refills | Status: DC | PRN
Start: 2020-02-20 — End: 2020-03-28

## 2020-02-20 NOTE — Progress Notes (Signed)
The patient comes in today due to reaccumulation of seroma at her left hip.  She is 3 weeks out tomorrow from a left total hip arthroplasty.  She is otherwise doing well.  On examination of her left hip incision looks great.  There is no erythema at all.  There is no evidence of infection.  I did aspirate 60 cc of fluid from this area which decompressed it well.  All questions and concerns were answered and addressed.  I did send in 1 more prescription for oxycodone.  We will see her back at her regular scheduled appointment on December 13.  No x-rays are needed.

## 2020-02-27 ENCOUNTER — Encounter: Payer: Self-pay | Admitting: Orthopaedic Surgery

## 2020-02-27 ENCOUNTER — Ambulatory Visit (INDEPENDENT_AMBULATORY_CARE_PROVIDER_SITE_OTHER): Payer: 59 | Admitting: Orthopaedic Surgery

## 2020-02-27 DIAGNOSIS — Z96642 Presence of left artificial hip joint: Secondary | ICD-10-CM

## 2020-02-27 NOTE — Progress Notes (Signed)
The patient comes in today in continued postop follow-up status post a left total hip arthroplasty that we did on 2 November.  It is 4 weeks tomorrow.  She has had a reaccumulation of the seroma.  Examination of her left hip shows no evidence of infection.  The incision is healed nicely.  I was able to aspirate 50 cc of seroma fluid off of her hip.  She does have a follow-up appoint with [name] in 2 weeks.  She will keep that appointment.  I can certainly attempt aspirated again and if this does build up she will let me know.

## 2020-02-28 ENCOUNTER — Other Ambulatory Visit: Payer: Self-pay

## 2020-02-28 NOTE — Telephone Encounter (Signed)
Who prescribed the oxycodone and hydrocodone. She is not suppose to get any pain meds form anyone other then this office with her contract

## 2020-02-28 NOTE — Telephone Encounter (Signed)
Patient was seen on 01/26/2020 for her chronic follow up.  A 30 day supply of hydrocodone was prescribed for back pain.  Patient reports that she normally receives a 3 month supply but did not at this visit.  She thinks it is because she was having hip surgery.  She was prescribed Oxycodone for her hip surgery but is no longer taking that medication.  She would like to know if you will send in two more prescriptions of Hydrocodone for the next 60 days to [facility] or does she need to come back in.  Please advise.

## 2020-02-28 NOTE — Telephone Encounter (Signed)
  Prescription Request  02/28/2020  What is the name of the medication or equipment? Hydrocodone . Was seen 10-28 for pain management and she don't have any RX's for pain meds. She got the one in October but not for the other two months. She was taking oxycodone for her hip replacement and don't take them anymore. That was just to get there through the hip replacement. Can MMM call in the other two RX's or does she need to come in to be seen.  Have you contacted your pharmacy to request a refill? (if applicable) Yes  Which pharmacy would you like this sent to? [facility]   Patient notified that their request is being sent to the clinical staff for review and that they should receive a response within 2 business days.

## 2020-02-29 MED ORDER — HYDROCODONE-ACETAMINOPHEN 7.5-325 MG PO TABS: 1.0000 | ORAL_TABLET | Freq: Four times a day (QID) | ORAL | 0 refills | Status: DC | PRN

## 2020-02-29 NOTE — Telephone Encounter (Signed)
Patient states that MMM prescribes her Hydro and Dr. Magnus Ivan prescribed her oxy for her hip surgery.

## 2020-02-29 NOTE — Telephone Encounter (Signed)
Patient aware and verbalizes understanding. 

## 2020-02-29 NOTE — Addendum Note (Signed)
Addended by: Bennie Pierini on: 02/29/2020 04:22 PM   Modules accepted: Orders

## 2020-02-29 NOTE — Telephone Encounter (Signed)
Refilled norco-x1 tell her she cannot get it filled by anyone else- ntbs for next refill

## 2020-02-29 NOTE — Telephone Encounter (Signed)
Patient called back and states she feels she is being punched for having hip surgery.  Requesting if MMM is not going to prescribe her medication anymore if she would refer her to Dr. Gerilyn Pilgrim.

## 2020-03-05 ENCOUNTER — Encounter: Payer: Self-pay | Admitting: Orthopaedic Surgery

## 2020-03-05 ENCOUNTER — Other Ambulatory Visit: Payer: Self-pay | Admitting: Specialist

## 2020-03-05 ENCOUNTER — Other Ambulatory Visit: Payer: Self-pay | Admitting: Nurse Practitioner

## 2020-03-05 ENCOUNTER — Other Ambulatory Visit: Payer: Self-pay | Admitting: Cardiology

## 2020-03-05 DIAGNOSIS — K2100 Gastro-esophageal reflux disease with esophagitis, without bleeding: Secondary | ICD-10-CM

## 2020-03-06 ENCOUNTER — Telehealth: Payer: Self-pay

## 2020-03-06 NOTE — Telephone Encounter (Signed)
Sent pt portal message. Per dr. Magnus Ivan we are working her in tomorrow. I worked her in @ 2pm

## 2020-03-06 NOTE — Telephone Encounter (Signed)
Patient called she stated her left hip has been hurting she stated her hip is swollen and needs to be drained [phone]

## 2020-03-07 ENCOUNTER — Ambulatory Visit (INDEPENDENT_AMBULATORY_CARE_PROVIDER_SITE_OTHER): Payer: 59

## 2020-03-07 ENCOUNTER — Ambulatory Visit (INDEPENDENT_AMBULATORY_CARE_PROVIDER_SITE_OTHER): Payer: 59 | Admitting: Orthopaedic Surgery

## 2020-03-07 DIAGNOSIS — Z96642 Presence of left artificial hip joint: Secondary | ICD-10-CM | POA: Diagnosis not present

## 2020-03-07 NOTE — Progress Notes (Signed)
The patient is now between 5 and 6 weeks status post a left total hip arthroplasty.  Her incision looks good.  She does still have some seroma.  I was able to aspirate about 20 to 30 cc of fluid from this area.  There is no evidence of infection at all.  She has had some worsening left hip pain.  Her x-rays show a well-seated implant with no complicating features.  She is walking without an assistive device at this standpoint.  She is a patient that is been on chronic pain medications provided by another office.  I put her on acute narcotics after surgery and according to the patient the other clinic is not happy with this.  Hopefully they will understand that this was short-term on my behalf because she needed postoperative narcotics dealing with joint replacement surgery.  I will see her back in 4 weeks to see how she is doing overall but no x-rays are needed.

## 2020-03-08 ENCOUNTER — Telehealth: Payer: Self-pay | Admitting: Orthopaedic Surgery

## 2020-03-08 MED ORDER — CYCLOBENZAPRINE HCL 10 MG PO TABS: 10.0000 mg | ORAL_TABLET | Freq: Three times a day (TID) | ORAL | 0 refills | Status: DC | PRN

## 2020-03-08 NOTE — Telephone Encounter (Signed)
Ok to refill 

## 2020-03-08 NOTE — Telephone Encounter (Signed)
Patient called needing Rx sent to the pharmacy [facility]) The number to contact patient is [phone]

## 2020-03-12 ENCOUNTER — Ambulatory Visit: Payer: 59 | Admitting: Orthopaedic Surgery

## 2020-03-13 ENCOUNTER — Other Ambulatory Visit: Payer: Self-pay | Admitting: Nurse Practitioner

## 2020-03-13 ENCOUNTER — Telehealth: Payer: Self-pay

## 2020-03-13 DIAGNOSIS — B009 Herpesviral infection, unspecified: Secondary | ICD-10-CM

## 2020-03-13 DIAGNOSIS — A6004 Herpesviral vulvovaginitis: Secondary | ICD-10-CM

## 2020-03-13 NOTE — Telephone Encounter (Signed)
Pt is concerned that she will not be able to get pain medication from you as she was told she broke her contract by getting pain meds from surgeon. He did make a note in her last visit regarding this but pt would like you to call him to discuss as she only got the pain meds for post op pain.

## 2020-03-13 NOTE — Telephone Encounter (Signed)
Pt aware of provider feedback and voiced understanding. 

## 2020-03-13 NOTE — Telephone Encounter (Signed)
She will just need to be seen for her next refill. We wil discuss pain management

## 2020-03-22 ENCOUNTER — Other Ambulatory Visit: Payer: Self-pay | Admitting: Specialist

## 2020-03-26 ENCOUNTER — Other Ambulatory Visit: Payer: Self-pay | Admitting: Nurse Practitioner

## 2020-03-28 ENCOUNTER — Encounter: Payer: Self-pay | Admitting: Nurse Practitioner

## 2020-03-28 ENCOUNTER — Ambulatory Visit (INDEPENDENT_AMBULATORY_CARE_PROVIDER_SITE_OTHER): Payer: 59 | Admitting: Nurse Practitioner

## 2020-03-28 ENCOUNTER — Other Ambulatory Visit: Payer: Self-pay

## 2020-03-28 VITALS — BP 135/92 | HR 86 | Temp 97.9°F | Resp 20 | Ht 62.0 in | Wt 141.0 lb

## 2020-03-28 DIAGNOSIS — G8929 Other chronic pain: Secondary | ICD-10-CM | POA: Diagnosis not present

## 2020-03-28 DIAGNOSIS — B009 Herpesviral infection, unspecified: Secondary | ICD-10-CM

## 2020-03-28 DIAGNOSIS — D500 Iron deficiency anemia secondary to blood loss (chronic): Secondary | ICD-10-CM | POA: Diagnosis not present

## 2020-03-28 MED ORDER — HYDROCODONE-ACETAMINOPHEN 7.5-325 MG PO TABS
1.0000 | ORAL_TABLET | Freq: Three times a day (TID) | ORAL | 0 refills | Status: AC
Start: 2020-04-27 — End: 2020-05-27

## 2020-03-28 MED ORDER — ACYCLOVIR 5 % EX OINT: TOPICAL_OINTMENT | CUTANEOUS | 2 refills | Status: DC

## 2020-03-28 MED ORDER — HYDROCODONE-ACETAMINOPHEN 7.5-325 MG PO TABS
1.0000 | ORAL_TABLET | Freq: Three times a day (TID) | ORAL | 0 refills | Status: DC
Start: 2020-05-27 — End: 2020-06-26

## 2020-03-28 MED ORDER — VALACYCLOVIR HCL 1 G PO TABS: 1000.0000 mg | ORAL_TABLET | Freq: Three times a day (TID) | ORAL | 2 refills | Status: DC

## 2020-03-28 MED ORDER — IRON (FERROUS SULFATE) 325 (65 FE) MG PO TABS: 325.0000 mg | ORAL_TABLET | Freq: Three times a day (TID) | ORAL | 0 refills | Status: DC

## 2020-03-28 MED ORDER — HYDROCODONE-ACETAMINOPHEN 7.5-325 MG PO TABS: 1.0000 | ORAL_TABLET | Freq: Three times a day (TID) | ORAL | 0 refills | Status: AC

## 2020-03-28 NOTE — Patient Instructions (Signed)

## 2020-03-28 NOTE — Progress Notes (Signed)
Subjective:    Patient ID: [name], female    DOB: 15-Nov-1955, 64 y.o.   MRN: [id number]   Chief Complaint: Pain Management   HPI Patient come sin  Today for pain management only. She recently had a total hip replacement and got pain medication from her surgeon. She is here today to reestablish pain management. Pain assessment: Cause of pain- DDD, Arthritis, scoliosis Pain location- low back pain and left hip Pain on scale of 1-10- 8/10 Frequency- daily What increases pain-to much activity What makes pain Better- nothing really helps Effects on ADL - none Any change in general medical condition-none  Current opioids rx- hydrocodone 7.5/325 TID # meds rx- 90 Effectiveness of current meds-helps make pain tolerable Adverse reactions from pain meds-none Morphine equivalent-  Pill count performed-No Last drug screen - 06/19/19 ( high risk q33m, moderate risk q33m, low risk yearly ) Urine drug screen today- No Was the [facility] reviewed- yes  If yes were their any concerning findings? - no   Overdose risk: 1  Pain contract signed on: 03/28/20  * has had 2 hematomas drained from her hip surgery and her hgb has been low. Iron supplements really make her feel better * she has had 3 herpes outbreaks since her surgery  Review of Systems  Constitutional: Negative for diaphoresis.  Eyes: Negative for pain.  Respiratory: Negative for shortness of breath.   Cardiovascular: Negative for chest pain, palpitations and leg swelling.  Gastrointestinal: Negative for abdominal pain.  Endocrine: Negative for polydipsia.  Skin: Negative for rash.  Neurological: Negative for dizziness, weakness and headaches.  Hematological: Does not bruise/bleed easily.  All other systems reviewed and are negative.      Objective:   Physical Exam Vitals and nursing note reviewed.  Constitutional:      General: She is not in acute distress.    Appearance: Normal appearance. She is  well-developed and well-nourished.  HENT:     Mouth/Throat:     Mouth: Oropharynx is clear and moist.  Eyes:     Extraocular Movements: EOM normal.  Neck:     Vascular: No carotid bruit or JVD.  Cardiovascular:     Rate and Rhythm: Normal rate and regular rhythm.     Pulses: Intact distal pulses.     Heart sounds: Normal heart sounds.  Pulmonary:     Effort: Pulmonary effort is normal. No respiratory distress.     Breath sounds: Normal breath sounds. No wheezing or rales.  Chest:     Chest wall: No tenderness.  Abdominal:     General: Bowel sounds are normal. There is no distension or abdominal bruit. Aorta is normal.     Palpations: Abdomen is soft. There is no hepatomegaly, splenomegaly, mass or pulsatile mass.     Tenderness: There is no abdominal tenderness.  Musculoskeletal:        General: No edema. Normal range of motion.     Cervical back: Normal range of motion and neck supple.     Comments: Rises slowly from sitting to standing  Lymphadenopathy:     Cervical: No cervical adenopathy.  Skin:    General: Skin is warm and dry.  Neurological:     Mental Status: She is alert and oriented to person, place, and time.     Deep Tendon Reflexes: Reflexes are normal and symmetric.  Psychiatric:        Mood and Affect: Mood and affect normal.  Behavior: Behavior normal.        Thought Content: Thought content normal.        Judgment: Judgment normal.    BP (!) 135/92   Pulse 86   Temp 97.9 F (36.6 C) (Temporal)   Resp 20   Ht 5\' 2"  (1.575 m)   Wt 141 lb (64 kg)   BMI 25.79 kg/m         Assessment & Plan:   HARMONI LUCUS in today with chief complaint of Pain Management   1. Other chronic pain New contract signed If needs back surgery need to let me know if going to get pain meds from someon eelse in advance - HYDROcodone-acetaminophen (NORCO) 7.5-325 MG tablet; Take 1 tablet by mouth in the morning, at noon, and at bedtime.  Dispense: 90 tablet;  Refill: 0 - HYDROcodone-acetaminophen (NORCO) 7.5-325 MG tablet; Take 1 tablet by mouth in the morning, at noon, and at bedtime.  Dispense: 90 tablet; Refill: 0 - HYDROcodone-acetaminophen (NORCO) 7.5-325 MG tablet; Take 1 tablet by mouth in the morning, at noon, and at bedtime.  Dispense: 90 tablet; Refill: 0  2. Herpes simplex virus (HSV) infection New valtrex prescription for out break sonly. Otherwise stay on valtrex 500mg  daily - acyclovir ointment (ZOVIRAX) 5 %; APPLY TOPICALLY EVERY 3 HOURS.  Dispense: 30 g; Refill: 2 - valACYclovir (VALTREX) 1000 MG tablet; Take 1 tablet (1,000 mg total) by mouth 3 (three) times daily. To be taken only when she has out break  Dispense: 21 tablet; Refill: 2  3. Iron deficiency anemia due to chronic blood loss - Iron, Ferrous Sulfate, 325 (65 Fe) MG TABS; Take 325 mg by mouth in the morning, at noon, and at bedtime.  Dispense: 90 tablet; Refill: 0    The above assessment and management plan was discussed with the patient. The patient verbalized understanding of and has agreed to the management plan. Patient is aware to call the clinic if symptoms persist or worsen. Patient is aware when to return to the clinic for a follow-up visit. Patient educated on when it is appropriate to go to the emergency department.   [name] 07-29-1975, FNP

## 2020-04-03 ENCOUNTER — Other Ambulatory Visit: Payer: Self-pay | Admitting: Nurse Practitioner

## 2020-04-03 DIAGNOSIS — G44229 Chronic tension-type headache, not intractable: Secondary | ICD-10-CM

## 2020-04-03 DIAGNOSIS — K2100 Gastro-esophageal reflux disease with esophagitis, without bleeding: Secondary | ICD-10-CM

## 2020-04-04 ENCOUNTER — Ambulatory Visit: Payer: 59 | Admitting: Orthopaedic Surgery

## 2020-04-05 ENCOUNTER — Ambulatory Visit: Payer: 59 | Admitting: Cardiology

## 2020-04-13 ENCOUNTER — Other Ambulatory Visit: Payer: Self-pay | Admitting: Nurse Practitioner

## 2020-05-03 ENCOUNTER — Ambulatory Visit (INDEPENDENT_AMBULATORY_CARE_PROVIDER_SITE_OTHER): Payer: 59 | Admitting: Orthopaedic Surgery

## 2020-05-03 ENCOUNTER — Encounter: Payer: Self-pay | Admitting: Orthopaedic Surgery

## 2020-05-03 ENCOUNTER — Other Ambulatory Visit: Payer: Self-pay | Admitting: Cardiology

## 2020-05-03 ENCOUNTER — Other Ambulatory Visit: Payer: Self-pay | Admitting: Nurse Practitioner

## 2020-05-03 DIAGNOSIS — K2100 Gastro-esophageal reflux disease with esophagitis, without bleeding: Secondary | ICD-10-CM

## 2020-05-03 DIAGNOSIS — F411 Generalized anxiety disorder: Secondary | ICD-10-CM

## 2020-05-03 DIAGNOSIS — G44229 Chronic tension-type headache, not intractable: Secondary | ICD-10-CM

## 2020-05-03 DIAGNOSIS — F331 Major depressive disorder, recurrent, moderate: Secondary | ICD-10-CM

## 2020-05-03 DIAGNOSIS — I10 Essential (primary) hypertension: Secondary | ICD-10-CM

## 2020-05-03 DIAGNOSIS — Z96642 Presence of left artificial hip joint: Secondary | ICD-10-CM

## 2020-05-03 MED ORDER — METHOCARBAMOL 500 MG PO TABS: 500.0000 mg | ORAL_TABLET | Freq: Four times a day (QID) | ORAL | 1 refills | Status: DC | PRN

## 2020-05-03 MED ORDER — CELECOXIB 200 MG PO CAPS: 200.0000 mg | ORAL_CAPSULE | Freq: Two times a day (BID) | ORAL | 1 refills | Status: DC | PRN

## 2020-05-03 MED ORDER — METHYLPREDNISOLONE 4 MG PO TABS: ORAL_TABLET | ORAL | 0 refills | Status: DC

## 2020-05-03 NOTE — Progress Notes (Signed)
The patient is now 3 months status post a left total hip arthroplasty.  She is walking well without assistive device and has no groin pain at all.  Her pain seems to be more of the trochanteric area and IT band.  She is very sensitive at her incision as well.  On exam I put her left hip through range of motion easily.  Her incisions healed nicely and there is no redness.  She has pain appropriately over the trochanteric area and the IT band.  She is not on any anti-inflammatories, so I will put her on Celebrex twice a day as well as Robaxin and a 6-day steroid taper.  She will work on stretching exercises and time.  This should get better.  Another step would be for outpatient physical therapy if it does not seem to be getting better.  From my standpoint I do not need to see her back for 6 months.  At that visit I like a standing low AP pelvis and lateral of her left operative hip.

## 2020-05-16 ENCOUNTER — Other Ambulatory Visit: Payer: Self-pay | Admitting: Nurse Practitioner

## 2020-05-16 DIAGNOSIS — G44229 Chronic tension-type headache, not intractable: Secondary | ICD-10-CM

## 2020-05-24 ENCOUNTER — Telehealth: Payer: Self-pay

## 2020-05-24 NOTE — Telephone Encounter (Signed)
Patient called regarding versa injections call back:[phone]

## 2020-05-24 NOTE — Telephone Encounter (Signed)
Patient last had bilateral greater trochanteric bursa injections on 11/26/2018. Ok to repeat if helped, same problem/side, and no new injury?

## 2020-05-24 NOTE — Telephone Encounter (Signed)
Scheduled for 2/28 at 0845.

## 2020-05-24 NOTE — Telephone Encounter (Signed)
Ok

## 2020-05-28 ENCOUNTER — Other Ambulatory Visit: Payer: Self-pay

## 2020-05-28 ENCOUNTER — Ambulatory Visit (INDEPENDENT_AMBULATORY_CARE_PROVIDER_SITE_OTHER): Payer: 59 | Admitting: Physical Medicine and Rehabilitation

## 2020-05-28 ENCOUNTER — Encounter: Payer: Self-pay | Admitting: Physical Medicine and Rehabilitation

## 2020-05-28 ENCOUNTER — Ambulatory Visit: Payer: Self-pay

## 2020-05-28 DIAGNOSIS — G8929 Other chronic pain: Secondary | ICD-10-CM

## 2020-05-28 DIAGNOSIS — M1611 Unilateral primary osteoarthritis, right hip: Secondary | ICD-10-CM

## 2020-05-28 DIAGNOSIS — M1612 Unilateral primary osteoarthritis, left hip: Secondary | ICD-10-CM | POA: Diagnosis not present

## 2020-05-28 DIAGNOSIS — M545 Low back pain, unspecified: Secondary | ICD-10-CM

## 2020-05-28 DIAGNOSIS — M546 Pain in thoracic spine: Secondary | ICD-10-CM | POA: Diagnosis not present

## 2020-05-28 DIAGNOSIS — M961 Postlaminectomy syndrome, not elsewhere classified: Secondary | ICD-10-CM

## 2020-05-28 NOTE — Progress Notes (Signed)
[name] - 65 y.o. female MRN [id number]  Date of birth: 11-28-1955  Office Visit Note: Visit Date: 05/28/2020 PCP: Bennie Pierini, FNP Referred by: Daphine Deutscher, [name], *  Subjective: Chief Complaint  Patient presents with  . Left Hip - Pain  . Right Hip - Pain   HPI:  [name] is a 65 y.o. female who comes in today For evaluation management of continued bilateral greater trochanteric type pain with low back pain and history of prior lumbar fusion.  She is followed in the office by Dr. Vira Browns from an orthopedic spine standpoint.  She is also been followed by Dr. Magnus Ivan and a few other the physicians here from orthopedic standpoint.  The last time I saw her we did complete greater trochanteric injections and those were very beneficial for her.  She has intolerances to most pain medications and nerve membrane stabilizing medications.  She is currently on mild opioid treatment.  The last time I saw the patient was in May 2021 and we completed intra-articular injection of the shoulder and hip joint.  Since I have seen her she has undergone total hip replacement.  Today she reports more lateral hip pain without any groin pain.  Worse with standing and laying on her side.  She also reports thoracic pain which has been very problematic for her.  [facility]  It sounds like she is also seeing Dr. Retia Passe for injections.  Talking with her at great length today she seems like they have talked to her about spinal cord stimulator trials.  I spent some time talking with her today about that.  Dr. Retia Passe does a good job with that and I reassured her in terms of what he is looking at.  She denies any focal weakness today no bowel or bladder changes no trauma.  Ports her pain is an 8 out of 10.  She gets worsening with going from sit to stand and prolonged sitting is also lying on the side.  Left and  right hip is equally painful.  Review of Systems  Musculoskeletal: Positive for back pain and joint pain.  All other systems reviewed and are negative.  Otherwise per HPI.  Assessment & Plan: Visit Diagnoses:    ICD-10-CM   1. Unilateral primary osteoarthritis, left hip  M16.12 XR C-ARM NO REPORT    Large Joint Inj: bilateral greater trochanter  2. Unilateral primary osteoarthritis, right hip  M16.11 XR C-ARM NO REPORT    Large Joint Inj: bilateral greater trochanter  3. Chronic bilateral low back pain without sciatica  M54.50    G89.29   4. Post laminectomy syndrome  M96.1   5. Chronic midline thoracic back pain  M54.6    G89.29     Plan: Findings:  1.  Bilateral hip pain seems to be over the greater trochanters and no pain with hip movement.  She has had hip replacement and continues to follow with Dr. Magnus Ivan.  She has had lumbar fusion surgery and continues to follow with Dr. Otelia Sergeant.  Last time we completed trochanteric injection she said she got a great deal of relief.  We will complete that today diagnostically and hopefully therapeutically.  If she does not get great relief of that then possibly could be related to the lumbar spine itself and with your follow-up with Dr. Otelia Sergeant we would look at possible injection there.  2.  Thoracic  pain she continues to follow-up with Dr. Jake Seats: And Dr. Retia Passe.  Sounds like maybe spinal cord stimulator trial injections.  She will continue to follow with them.    Meds & Orders: No orders of the defined types were placed in this encounter.   Orders Placed This Encounter  Procedures  . Large Joint Inj: bilateral greater trochanter  . XR C-ARM NO REPORT    Follow-up: Return if symptoms worsen or fail to improve.   Procedures: Large Joint Inj: bilateral greater trochanter on 05/28/2020 9:01 AM Indications: pain and diagnostic evaluation Details: 22 G 3.5 in needle, fluoroscopy-guided lateral approach  Arthrogram: No  Medications (Right): 40  mg triamcinolone acetonide 40 MG/ML; 4 mL bupivacaine 0.25 % Medications (Left): 40 mg triamcinolone acetonide 40 MG/ML; 4 mL bupivacaine 0.25 % Outcome: tolerated well, no immediate complications  There was excellent flow of contrast outlined the greater trochanteric bursa without vascular uptake. Procedure, treatment alternatives, risks and benefits explained, specific risks discussed. Consent was given by the patient. Immediately prior to procedure a time out was called to verify the correct patient, procedure, equipment, support staff and site/side marked as required. Patient was prepped and draped in the usual sterile fashion.          Clinical History: DG HIP (WITH OR WITHOUT PELVIS) 2-3V LEFT  COMPARISON:  April 16, 2017  FINDINGS: Frontal pelvis as well as frontal and lateral left hip images were obtained. No acute fracture or dislocation evident. 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 No erosive change. There is postoperative change in the lower lumbar spine. Sacroiliac joints appear normal bilaterally.  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 No acute fracture or dislocation. Postoperative change lower lumbar spine.   Electronically Signed   By: Bretta Bang III M.D.   On: 08/10/2017 14:23     Objective:  VS:  HT:    WT:   BMI:     BP:   HR: bpm  TEMP: ( )  RESP:  Physical Exam Vitals and nursing note reviewed.  Constitutional:      General: She is not in acute distress.    Appearance: Normal appearance. She is not ill-appearing.  HENT:     Head: Normocephalic and atraumatic.     Right Ear: External ear normal.     Left Ear: External ear normal.  Eyes:     Extraocular Movements: Extraocular movements intact.  Cardiovascular:     Rate and Rhythm: Normal rate.     Pulses: Normal pulses.  Pulmonary:     Effort: Pulmonary effort is normal. No respiratory distress.  Abdominal:     General: There is no  distension.     Palpations: Abdomen is soft.  Musculoskeletal:        General: Tenderness present.     Cervical back: Neck supple.     Right lower leg: No edema.     Left lower leg: No edema.     Comments: Patient has good distal strength with concordant pain over the greater trochanters.  No clonus or focal weakness.  Pain with extension of the lumbar spine very stiff the lumbar spine.  multilevel trigger points in the paraspinal region through the thoracic spine.  No pain with hip rotation.  Skin:    Findings: No erythema, lesion or rash.  Neurological:     General: No focal deficit present.     Mental Status: She is alert and oriented to person, place, and time.  Sensory: No sensory deficit.     Motor: No weakness or abnormal muscle tone.     Coordination: Coordination normal.  Psychiatric:        Mood and Affect: Mood normal.        Behavior: Behavior normal.      Imaging: No results found.

## 2020-05-28 NOTE — Progress Notes (Signed)
Pt state left hip pain and her right has been giving her problems lately. Pt state sitting makes her pain worse. Pt state she take pain meds to help ease her pain.  Numeric Pain Rating Scale and Functional Assessment Average Pain 8   In the last MONTH (on 0-10 scale) has pain interfered with the following?  1. General activity like being  able to carry out your everyday physical activities such as walking, climbing stairs, carrying groceries, or moving a chair?  Rating(8)

## 2020-06-01 ENCOUNTER — Other Ambulatory Visit: Payer: Self-pay | Admitting: Nurse Practitioner

## 2020-06-01 DIAGNOSIS — K2100 Gastro-esophageal reflux disease with esophagitis, without bleeding: Secondary | ICD-10-CM

## 2020-06-01 DIAGNOSIS — G8929 Other chronic pain: Secondary | ICD-10-CM

## 2020-06-01 DIAGNOSIS — B009 Herpesviral infection, unspecified: Secondary | ICD-10-CM

## 2020-06-01 DIAGNOSIS — G44229 Chronic tension-type headache, not intractable: Secondary | ICD-10-CM

## 2020-06-01 NOTE — Telephone Encounter (Signed)
I am unable to refuse hydrocodone and fiorcet. Can you please refuse.

## 2020-06-05 ENCOUNTER — Telehealth (INDEPENDENT_AMBULATORY_CARE_PROVIDER_SITE_OTHER): Payer: 59 | Admitting: Family Medicine

## 2020-06-05 ENCOUNTER — Encounter: Payer: Self-pay | Admitting: Family Medicine

## 2020-06-05 DIAGNOSIS — H00011 Hordeolum externum right upper eyelid: Secondary | ICD-10-CM

## 2020-06-05 MED ORDER — MUPIROCIN 2 % EX OINT: TOPICAL_OINTMENT | CUTANEOUS | 0 refills | Status: DC

## 2020-06-05 MED ORDER — AMOXICILLIN 875 MG PO TABS: 875.0000 mg | ORAL_TABLET | Freq: Two times a day (BID) | ORAL | 0 refills | Status: AC

## 2020-06-05 NOTE — Progress Notes (Signed)
Subjective:    Patient ID: [name], female    DOB: 05-30-1955, 65 y.o.   MRN: [id number]   HPI: [name] is a 65 y.o. female presenting for stye on right upper eye lid. Swollen. Onset 3 days ago. Denies vision changes. Having sharp pain. Not draining. No fever.   Depression screen [facility] 2/9 03/28/2020 01/26/2020 01/10/2020 11/10/2019 09/29/2019  Decreased Interest 0 1 0 0 3  Down, Depressed, Hopeless 0 0 0 0 2  PHQ - 2 Score 0 1 0 0 5  Altered sleeping - 3 3 - 3  Tired, decreased energy - 3 3 - 3  Change in appetite - 0 1 - 1  Feeling bad or failure about yourself  - 0 0 - 0  Trouble concentrating - 0 2 - 2  Moving slowly or fidgety/restless - 0 0 - 1  Suicidal thoughts - 0 0 - 0  PHQ-9 Score - 7 9 - 15  Difficult doing work/chores - Not difficult at all Not difficult at all - Somewhat difficult  Some recent data might be hidden     Relevant past medical, surgical, family and social history reviewed and updated as indicated.  Interim medical history since our last visit reviewed. Allergies and medications reviewed and updated.  ROS:  Review of Systems  Constitutional: Negative.   HENT: Negative.   Eyes: Positive for itching. Negative for photophobia and visual disturbance.     Social History   Tobacco Use  Smoking Status Never Smoker  Smokeless Tobacco Never Used       Objective:     Wt Readings from Last 3 Encounters:  03/28/20 141 lb (64 kg)  01/31/20 148 lb (67.1 kg)  01/27/20 148 lb 14.4 oz (67.5 kg)     Video exam shows 3 mm stye at right upper lid. Pinpoint red. Mild erythema.   Assessment & Plan:   1. Hordeolum externum of right upper eyelid     Meds ordered this encounter  Medications  . amoxicillin (AMOXIL) 875 MG tablet    Sig: Take 1 tablet (875 mg total) by mouth 2 (two) times daily for 10 days.    Dispense:  20 tablet    Refill:  0  . mupirocin ointment (BACTROBAN) 2 %    Sig: Apply to affected area twice daily     Dispense:  22 each    Refill:  0       Diagnoses and all orders for this visit:  Hordeolum externum of right upper eyelid  Other orders -     amoxicillin (AMOXIL) 875 MG tablet; Take 1 tablet (875 mg total) by mouth 2 (two) times daily for 10 days. -     mupirocin ointment (BACTROBAN) 2 %; Apply to affected area twice daily    Virtual Video Visit  I discussed the limitations, risks, security and privacy concerns of performing an evaluation and management service by virtual video and the availability of in person appointments. The patient was identified with two identifiers. Pt.expressed understanding and agreed to proceed. Pt. Is at home. Dr. Darlyn Read is in his office.  Follow Up Instructions:   I discussed the assessment and treatment plan with the patient. The patient was provided an opportunity to ask questions and all were answered. The patient agreed with the plan and demonstrated an understanding of the instructions.   The patient was advised to call back or seek an in-person evaluation if the symptoms worsen or if the  condition fails to improve as anticipated.   Total minutes including chart review and phone contact time: 12   Follow up plan: Return if symptoms worsen or fail to improve.  Mechele Claude, MD [place] [facility]

## 2020-06-07 ENCOUNTER — Encounter: Payer: Self-pay | Admitting: Gastroenterology

## 2020-06-07 ENCOUNTER — Ambulatory Visit (INDEPENDENT_AMBULATORY_CARE_PROVIDER_SITE_OTHER): Payer: 59 | Admitting: Gastroenterology

## 2020-06-07 VITALS — BP 120/80 | HR 92 | Ht 62.0 in | Wt 134.6 lb

## 2020-06-07 DIAGNOSIS — K449 Diaphragmatic hernia without obstruction or gangrene: Secondary | ICD-10-CM

## 2020-06-07 DIAGNOSIS — R1030 Lower abdominal pain, unspecified: Secondary | ICD-10-CM | POA: Diagnosis not present

## 2020-06-07 DIAGNOSIS — R1319 Other dysphagia: Secondary | ICD-10-CM | POA: Diagnosis not present

## 2020-06-07 DIAGNOSIS — K219 Gastro-esophageal reflux disease without esophagitis: Secondary | ICD-10-CM

## 2020-06-07 NOTE — Progress Notes (Signed)
[facility] Consult Note:  History: [name] 06/07/2020  Referring provider: Bennie Pierini, FNP  Reason for consult/chief complaint: Gastroesophageal Reflux (Patient indicates that she was told by her scoliosis physician told her to come here because her hernia "was really bad." She indicates that she has had reflux for years. Notes some nausea and vomiting at times. Did have some issues with food and liquids "sticking" in the esophagus at times. Does have acid regurgitation back up into the throat.)   Subjective  HPI: This is a 65 year old woman seen by me once in January 2019 with longstanding history of reflux and hiatal hernia with intractable heartburn, previously seen by Dr. Arlyce Dice, last endoscopy in 2015 with 2 to 3 cm hiatal hernia and mild stricture, underwent bougie dilation.  My last office note outlined this patient's chronic upper digestive as well as other somatic symptoms.  [name] was here today with concerns about her reflux.  She always has severe regurgitation with some intermittent nausea and vomiting.  She feels as if food and liquids are getting "stuck" in the esophagus at times.  She also reports seeing her scoliosis physician recently, and x-ray findings of her spine apparently indicated a hiatal hernia and she was told to come see [name] about that.  Last colonoscopy January 2014.  ROS:  Review of Systems  Constitutional: Negative for appetite change and unexpected weight change.  HENT: Negative for mouth sores and voice change.   Eyes: Negative for pain and redness.  Respiratory: Negative for cough and shortness of breath.   Cardiovascular: Negative for chest pain and palpitations.  Genitourinary: Negative for dysuria and hematuria.  Musculoskeletal: Negative for arthralgias and myalgias.       Diffuse chronic pain syndrome  Skin: Negative for pallor and rash.  Neurological: Negative for weakness and headaches.   Hematological: Negative for adenopathy.  Psychiatric/Behavioral: The patient is nervous/anxious.      Past Medical History: Past Medical History:  Diagnosis Date  . Anemia   . Anginal pain (HCC)    last time   . Anxiety   . Arthritis    RHEUMATOID  . Asthma   . Bipolar 1 disorder (HCC)   . Cataracts, bilateral 07/2017  . CHF (congestive heart failure) (HCC)   . COPD (chronic obstructive pulmonary disease) (HCC)   . Coronary artery disease    reported hx of "MI";  Echo 2009 with normal LVF;  Myoview 05/2011: no ischemia  . Depression   . Diabetes mellitus without complication (HCC)   . Dyslipidemia   . Dysrhythmia    SVT  . Esophageal stricture   . Fibromyalgia   . GERD (gastroesophageal reflux disease)   . H/O hiatal hernia   . Head injury, unspecified   . Headache    migraines  . Herpes simplex infection   . History of kidney stones   . History of loop recorder    Managed by Dr. Sharrell Ku  . Hyperlipidemia   . Hypertension   . Insomnia   . Myocardial infarction [facility])    age 65  . Osteoporosis   . Pneumonia    hx  . Seizures (HCC)   . Shortness of breath   . Sleep apnea    ? neg  . Spinal stenosis of lumbar region   . Spondylolisthesis   . Status post placement of implantable loop recorder   . Supraventricular tachycardia (HCC)   . Syncope and collapse    s/p ILR; no arhythmogenic  cause identified  . UTI (lower urinary tract infection)      Past Surgical History: Past Surgical History:  Procedure Laterality Date  . BACK SURGERY    . BREAST SURGERY     lumpectomy  . CARDIAC CATHETERIZATION  10/06/2011  . CATARACT EXTRACTION W/PHACO Right 07/31/2017   Procedure: CATARACT EXTRACTION PHACO AND INTRAOCULAR LENS PLACEMENT (IOC);  Surgeon: Fabio Pierce, MD;  Location: [facility];  Service: Ophthalmology;  Laterality: Right;  CDE: 2.33  . CATARACT EXTRACTION W/PHACO Left 08/14/2017   Procedure: CATARACT EXTRACTION PHACO AND INTRAOCULAR LENS PLACEMENT (IOC);   Surgeon: Fabio Pierce, MD;  Location: [facility];  Service: Ophthalmology;  Laterality: Left;  CDE: 2.74  . CHOLECYSTECTOMY    . CYSTOSCOPY     stone  . DIAGNOSTIC LAPAROSCOPY     laparoscopic cholecystectomy  . DOPPLER ECHOCARDIOGRAPHY  2009  . head up tilt table testing  06/15/2007   [name]  . HEMORRHOID SURGERY    . insertion of implatable loop recorder  08/11/2007   [name]  . POSTERIOR CERVICAL FUSION/FORAMINOTOMY N/A 12/19/2013   Procedure: RIGHT C3-4.C4-5 AND C5-6 FORAMINOTOMIES;  Surgeon: Kerrin Champagne, MD;  Location: [facility] OR;  Service: Orthopedics;  Laterality: N/A;  . TOTAL HIP ARTHROPLASTY Left 01/31/2020   Procedure: LEFT TOTAL HIP ARTHROPLASTY ANTERIOR APPROACH;  Surgeon: Kathryne Hitch, MD;  Location: [facility] OR;  Service: Orthopedics;  Laterality: Left;  . TOTAL SHOULDER ARTHROPLASTY Right 11/15/2019   Procedure: RIGHT TOTAL SHOULDER ARTHROPLASTY;  Surgeon: Cammy Copa, MD;  Location: [facility];  Service: Orthopedics;  Laterality: Right;  . TUBAL LIGATION       Family History: Family History  Problem Relation Age of Onset  . Heart attack Father   . Mental illness Father   . Mental illness Mother   . Heart attack Brother        stents  . Alcohol abuse Brother   . Heart disease Brother   . Drug abuse Brother   . Diabetes Brother   . Colon cancer Maternal Aunt   . Cirrhosis Brother   . Stomach cancer Neg Hx     Social History: Social History   Socioeconomic History  . Marital status: Single    Spouse name: Not on file  . Number of children: 5  . Years of education: Not on file  . Highest education level: 5th grade  Occupational History  . Occupation: Disability    Comment: 15 years  Tobacco Use  . Smoking status: Never Smoker  . Smokeless tobacco: Never Used  Vaping Use  . Vaping Use: Never used  Substance and Sexual Activity  . Alcohol use: No    Alcohol/week: 0.0 standard drinks  . Drug use: No  . Sexual activity: Not Currently   Other Topics Concern  . Not on file  Social History Narrative   Patient lives with Daughter   Patient is on disability.    Patient has 5th grade education.    Patient has 5 children, 24 grand-chilren, and 5 great-grand children.    Right handed    Social Determinants of Health   Financial Resource Strain: Not on file  Food Insecurity: Not on file  Transportation Needs: Not on file  Physical Activity: Not on file  Stress: Not on file  Social Connections: Not on file    Allergies: Allergies  Allergen Reactions  . Codeine Other (See Comments)    "I will have a heart attack."  . Morphine And Related Other (  See Comments)    "It will cause me to have a heart attack."  . Ambien [Zolpidem Tartrate] Nausea And Vomiting  . Clonidine Derivatives Nausea And Vomiting    gerd - caused acid reflux per pt  . Metformin And Related     Nausea/vomiting/cramping from metformin  . Lyrica [Pregabalin] Swelling and Other (See Comments)    Weight gain  . Neurontin [Gabapentin] Other (See Comments)    Causes elevated LFTs     Outpatient Meds: Current Outpatient Medications  Medication Sig Dispense Refill  . acyclovir ointment (ZOVIRAX) 5 % APPLY TOPICALLY EVERY 3 HOURS. 30 g 2  . albuterol (PROVENTIL) (2.5 MG/3ML) 0.083% nebulizer solution Take 3 mLs (2.5 mg total) by nebulization every 4 (four) hours as needed for wheezing or shortness of breath. 180 mL 0  . albuterol (VENTOLIN HFA) 108 (90 Base) MCG/ACT inhaler INHALE 2 PUFFS EVERY 6 HOURS AS NEEDED FOR SHORTNESS OF BREATH AND WHEEZING. 8.5 g 2  . Alcohol Swabs (GLOBAL ALCOHOL PREP EASE) 70 % PADS Test BS daily R73.03 100 each 3  . amoxicillin (AMOXIL) 875 MG tablet Take 1 tablet (875 mg total) by mouth 2 (two) times daily for 10 days. 20 tablet 0  . busPIRone (BUSPAR) 10 MG tablet TAKE 1 TABLET BY MOUTH TWICE DAILY AS NEEDED. 60 tablet 0  . butalbital-acetaminophen-caffeine (FIORICET) 50-325-40 MG tablet TAKE 1 TABLET BY MOUTH EVERY 6  HOURS AS NEEDED FOR HEADACHE. 20 tablet 0  . butalbital-aspirin-caffeine-codeine (FIORINAL/CODEINE #3) 50-325-40-30 MG capsule Take 1 capsule by mouth every 4 (four) hours as needed for pain.    . carvedilol (COREG) 6.25 MG tablet TAKE 1 1/2 TABLETS BY MOUTH TWICE DAILY. 90 tablet 0  . celecoxib (CELEBREX) 200 MG capsule Take 1 capsule (200 mg total) by mouth 2 (two) times daily between meals as needed. 60 capsule 1  . dexlansoprazole (DEXILANT) 60 MG capsule Take 1 capsule (60 mg total) by mouth daily. 90 capsule 1  . diclofenac Sodium (VOLTAREN) 1 % GEL Apply 4 g topically 4 (four) times daily. (Patient taking differently: Apply 4 g topically 4 (four) times daily as needed (pain).) 500 g 3  . DULoxetine (CYMBALTA) 60 MG capsule Take 1 capsule (60 mg total) by mouth at bedtime. 90 capsule 1  . empagliflozin (JARDIANCE) 25 MG TABS tablet Take 1 tablet (25 mg total) by mouth daily before breakfast. 90 tablet 3  . escitalopram (LEXAPRO) 20 MG tablet TAKE 1 TABLET ONCE DAILY. 30 tablet 0  . estradiol (ESTRACE) 0.1 MG/GM vaginal cream Place 1 Applicatorful vaginally at bedtime. 42.5 g 0  . FIBER PO Take 2 tablets by mouth in the morning, at noon, and at bedtime.    . fludrocortisone (FLORINEF) 0.1 MG tablet TAKE 1 TABLET EVERY OTHER DAY. 15 tablet 3  . fluticasone (FLONASE) 50 MCG/ACT nasal spray USE 1 SPRAY IN EACH NOSTRIL ONCE DAILY. 16 g 5  . fluticasone furoate-vilanterol (BREO ELLIPTA) 200-25 MCG/INH AEPB Inhale 1 puff into the lungs daily. 60 each 5  . furosemide (LASIX) 40 MG tablet TAKE 1 TABLET ONCE DAILY AS NEEDED FOR FLUID. 90 tablet 1  . glucose blood (ONETOUCH VERIO) test strip Test BS daily R73.03 100 strip 3  . hydrALAZINE (APRESOLINE) 25 MG tablet TAKE 1 TABLET BY MOUTH 3 TIMES DAILY AS NEEDED (SEVERE HYPERTENSION/SYSTOLIC NUMBER 170 OR GREATER). 90 tablet 3  . HYDROcodone-acetaminophen (NORCO) 7.5-325 MG tablet Take 1 tablet by mouth in the morning, at noon, and at bedtime. 90 tablet 0   .  ipratropium (ATROVENT) 0.02 % nebulizer solution USE 1 VIAL ( ) IN NEBULIZER EVERY 6 HOURS AS NEEDED FOR WHEEZING OR SHORTNESS OF BREATH. 150 mL 2  . isosorbide mononitrate (IMDUR) 30 MG 24 hr tablet Take 1 tablet (30 mg total) by mouth daily. 90 tablet 1  . lamoTRIgine (LAMICTAL) 150 MG tablet Take 1 tablet (150 mg total) by mouth daily. 90 tablet 1  . Lancets (ONETOUCH DELICA PLUS LANCET33G) MISC USE TO CHECK SUGAR DAILY. 300 each 5  . levETIRAcetam (KEPPRA) 500 MG tablet TAKE (1) TABLET TWICE DAILY. 180 tablet 1  . levocetirizine (XYZAL) 5 MG tablet TAKE 1 TABLET BY MOUTH EVERY MORNING. 30 tablet 5  . lidocaine (XYLOCAINE) 5 % ointment APPLY TO AFFECTED AREA 3 TIMES ADAY AS NEEDED FOR MILD OR MODERATE PAIN. 35.44 g 0  . methocarbamol (ROBAXIN) 500 MG tablet Take 1 tablet (500 mg total) by mouth every 6 (six) hours as needed. 40 tablet 1  . mupirocin ointment (BACTROBAN) 2 % Apply to affected area twice daily 22 each 0  . nitroGLYCERIN (NITROSTAT) 0.4 MG SL tablet PLACE ONE (1) TABLET UNDER TONGUE EVERY 5 MINUTES UP TO (3) DOSES AS NEEDED FOR CHEST PAIN. 25 tablet 0  . ondansetron (ZOFRAN) 4 MG tablet TAKE 1 TABLET BY MOUTH EVERY 8 HOURS AS NEEDED FOR NAUSEA AND VOMITING. 20 tablet 0  . rosuvastatin (CRESTOR) 10 MG tablet Take 1 tablet (10 mg total) by mouth daily. 90 tablet 1  . sucralfate (CARAFATE) 1 g tablet TAKE 1 TABLET 4 TIMES DAILY WITH MEALS AND AT BEDTIME. 90 tablet 0  . traZODone (DESYREL) 150 MG tablet Take 1 tablet (150 mg total) by mouth at bedtime. 90 tablet 1  . valACYclovir (VALTREX) 1000 MG tablet TAKE 1 TABLET 3 TIMES A DAY. TO BE TAKEN ONLY WHEN SHE HAS OUT BREAK. 21 tablet 0  . valACYclovir (VALTREX) 500 MG tablet Take 1 tablet (500 mg total) by mouth 2 (two) times daily. 180 tablet 1   No current facility-administered medications for this visit.      ___________________________________________________________________ Objective   Exam:  BP 120/80   Pulse 92    Ht  (1.575 m)   Wt 134 lb 9.6 oz (61.1 kg)   BMI 24.62 kg/m  Wt Readings from Last 3 Encounters:  06/07/20 134 lb 9.6 oz (61.1 kg)  03/28/20 141 lb (64 kg)  01/31/20 148 lb (67.1 kg)     General: Pleasant and conversational, gets on exam table slowly but without assistance.  Eyes: sclera anicteric, no redness  ENT: oral mucosa moist without lesions, no cervical or supraclavicular lymphadenopathy  CV: RRR without murmur, S1/S2, no JVD, no peripheral edema  Resp: clear to auscultation bilaterally, normal RR and effort noted  GI: soft, lower tenderness, with active bowel sounds. No guarding or palpable organomegaly noted.  Skin; warm and dry, no rash or jaundice noted  Neuro: awake, alert and oriented x 3. Normal gross motor function and fluent speech  Radiologic Studies:  Our staff was able to find a [facility] thoracic spine film series from about 2 months ago that indicated moderate sized hiatal hernia (as well as orthopedic findings) _________________________  CLINICAL DATA:  Diffuse abdominal pain for the past 6 days. Nausea and vomiting 2 days ago.   EXAM: CT ABDOMEN AND PELVIS WITH CONTRAST   TECHNIQUE: Multidetector CT imaging of the abdomen and pelvis was performed using the standard protocol following bolus administration of intravenous contrast.   CONTRAST:   OMNIPAQUE IOHEXOL 300 MG/ML  SOLN   COMPARISON:  12/12/2016.   FINDINGS: Lower chest: Moderately large hiatal hernia. Increased linear atelectasis/scarring at both lung bases. Normal sized heart.   Hepatobiliary: Mild diffuse low density of the liver relative to the spleen. Cholecystectomy clips. Stable post cholecystectomy biliary ductal dilatation.   Pancreas: Mild diffuse pancreatic atrophy.   Spleen: Normal in size without focal abnormality.   Adrenals/Urinary Tract: Adrenal glands are unremarkable. Kidneys are normal, without renal calculi, focal lesion, or  hydronephrosis. Bladder is unremarkable.   Stomach/Bowel: Diffuse low to medium density wall thickening involving the distal sigmoid colon in an area of multiple diverticula. There is associated pericolonic soft tissue stranding. There is minimal adjacent free peritoneal fluid without abscess formation.   Normal appearing appendix and small bowel.   Vascular/Lymphatic: Minimal atheromatous aortic calcification. No aneurysm. No enlarged lymph nodes.   Reproductive: Uterus and bilateral adnexa are unremarkable.   Other: No abdominal wall hernia or abnormality. No abdominopelvic ascites.   Musculoskeletal: Interbody and pedicle screw and rod fusion at the L4-5 level with normal alignment. Interval 30% T12 superior endplate compression deformity and Schmorl's node formation. This is healed with no acute fracture lines. Associated mild bony retropulsion.   Mild lumbar and lower thoracic spine degenerative changes. Left ischial bone island.   IMPRESSION: 1. Sigmoid colon diverticulitis without abscess. 2. Mild diffuse hepatic steatosis. 3. Moderately large hiatal hernia.     Electronically Signed   By: Beckie Salts M.D.   On: 04/14/2019 19:46   Assessment: Encounter Diagnoses  Name Primary?  . Gastroesophageal reflux disease without esophagitis Yes  . Esophageal dysphagia   . Hiatal hernia   . Lower abdominal pain     Longstanding GERD with known hiatal hernia and reportedly worsening dysphagia.  Unknown if stricture, motility issue, worsening hernia (see CT report above).  She also has chronic lower abdominal pain in the context of diffuse chronic pain syndrome.  She believes her lower abdominal pain is from recurrent diverticulitis, though it is not clear that has consistently been the case.  Plan:  Upper endoscopy.  She was agreeable after discussion of procedure and risks  The benefits and risks of the planned procedure were described in detail with the patient or  (when appropriate) their health care proxy.  Risks were outlined as including, but not limited to, bleeding, infection, perforation, adverse medication reaction leading to cardiac or pulmonary decompensation, pancreatitis (if ERCP).  The limitation of incomplete mucosal visualization was also discussed.  No guarantees or warranties were given.   Thank you for the courtesy of this consult.  Please call me with any questions or concerns.  Charlie Pitter III  CC: Referring provider noted above

## 2020-06-07 NOTE — Patient Instructions (Signed)
If you are age 64 or older, your body mass index should be between 23-30. Your Body mass index is 24.62 kg/m. If this is out of the aforementioned range listed, please consider follow up with your Primary Care Provider.  If you are age 64 or younger, your body mass index should be between 19-25. Your Body mass index is 24.62 kg/m. If this is out of the aformentioned range listed, please consider follow up with your Primary Care Provider.   You have been scheduled for an endoscopy. Please follow written instructions given to you at your visit today. If you use inhalers (even only as needed), please bring them with you on the day of your procedure.  Follow up pending your Endoscopy or as needed.  It was a pleasure to see you today!  Dr. Myrtie Neither

## 2020-06-26 ENCOUNTER — Encounter: Payer: Self-pay | Admitting: Nurse Practitioner

## 2020-06-26 ENCOUNTER — Other Ambulatory Visit: Payer: Self-pay

## 2020-06-26 ENCOUNTER — Ambulatory Visit (INDEPENDENT_AMBULATORY_CARE_PROVIDER_SITE_OTHER): Payer: 59 | Admitting: Nurse Practitioner

## 2020-06-26 VITALS — BP 114/78 | HR 62 | Temp 98.0°F | Resp 20 | Ht 62.0 in | Wt 134.0 lb

## 2020-06-26 DIAGNOSIS — I5032 Chronic diastolic (congestive) heart failure: Secondary | ICD-10-CM

## 2020-06-26 DIAGNOSIS — M810 Age-related osteoporosis without current pathological fracture: Secondary | ICD-10-CM

## 2020-06-26 DIAGNOSIS — E785 Hyperlipidemia, unspecified: Secondary | ICD-10-CM

## 2020-06-26 DIAGNOSIS — R569 Unspecified convulsions: Secondary | ICD-10-CM

## 2020-06-26 DIAGNOSIS — G44229 Chronic tension-type headache, not intractable: Secondary | ICD-10-CM

## 2020-06-26 DIAGNOSIS — G8929 Other chronic pain: Secondary | ICD-10-CM

## 2020-06-26 DIAGNOSIS — G4733 Obstructive sleep apnea (adult) (pediatric): Secondary | ICD-10-CM | POA: Diagnosis not present

## 2020-06-26 DIAGNOSIS — I10 Essential (primary) hypertension: Secondary | ICD-10-CM | POA: Diagnosis not present

## 2020-06-26 DIAGNOSIS — K5790 Diverticulosis of intestine, part unspecified, without perforation or abscess without bleeding: Secondary | ICD-10-CM

## 2020-06-26 DIAGNOSIS — E663 Overweight: Secondary | ICD-10-CM

## 2020-06-26 DIAGNOSIS — R7303 Prediabetes: Secondary | ICD-10-CM

## 2020-06-26 DIAGNOSIS — B009 Herpesviral infection, unspecified: Secondary | ICD-10-CM

## 2020-06-26 DIAGNOSIS — K2100 Gastro-esophageal reflux disease with esophagitis, without bleeding: Secondary | ICD-10-CM | POA: Diagnosis not present

## 2020-06-26 DIAGNOSIS — R82998 Other abnormal findings in urine: Secondary | ICD-10-CM

## 2020-06-26 DIAGNOSIS — J309 Allergic rhinitis, unspecified: Secondary | ICD-10-CM

## 2020-06-26 DIAGNOSIS — F411 Generalized anxiety disorder: Secondary | ICD-10-CM

## 2020-06-26 DIAGNOSIS — F5101 Primary insomnia: Secondary | ICD-10-CM

## 2020-06-26 DIAGNOSIS — F3341 Major depressive disorder, recurrent, in partial remission: Secondary | ICD-10-CM

## 2020-06-26 DIAGNOSIS — R0789 Other chest pain: Secondary | ICD-10-CM

## 2020-06-26 LAB — URINALYSIS, COMPLETE
Bilirubin, UA: NEGATIVE
Ketones, UA: NEGATIVE
Leukocytes,UA: NEGATIVE
Nitrite, UA: NEGATIVE
Protein,UA: NEGATIVE
RBC, UA: NEGATIVE
Specific Gravity, UA: 1.02 (ref 1.005–1.030)
Urobilinogen, Ur: 0.2 mg/dL (ref 0.2–1.0)
pH, UA: 5 (ref 5.0–7.5)

## 2020-06-26 LAB — MICROSCOPIC EXAMINATION
Bacteria, UA: NONE SEEN
RBC, Urine: NONE SEEN /hpf (ref 0–2)
WBC, UA: NONE SEEN /hpf (ref 0–5)

## 2020-06-26 LAB — BAYER DCA HB A1C WAIVED: HB A1C (BAYER DCA - WAIVED): 5.9 % (ref <7.0)

## 2020-06-26 MED ORDER — ESCITALOPRAM OXALATE 20 MG PO TABS
20.0000 mg | ORAL_TABLET | Freq: Every day | ORAL | 1 refills | Status: DC
Start: 2020-06-26 — End: 2021-01-01

## 2020-06-26 MED ORDER — TRAZODONE HCL 150 MG PO TABS
150.0000 mg | ORAL_TABLET | Freq: Every day | ORAL | 1 refills | Status: DC
Start: 2020-06-26 — End: 2021-01-01

## 2020-06-26 MED ORDER — BREO ELLIPTA 200-25 MCG/INH IN AEPB
1.0000 | INHALATION_SPRAY | Freq: Every day | RESPIRATORY_TRACT | 5 refills | Status: DC
Start: 2020-06-26 — End: 2021-01-01

## 2020-06-26 MED ORDER — FUROSEMIDE 40 MG PO TABS
40.0000 mg | ORAL_TABLET | Freq: Every day | ORAL | 1 refills | Status: DC
Start: 2020-06-26 — End: 2020-10-29

## 2020-06-26 MED ORDER — BUSPIRONE HCL 10 MG PO TABS: 10.0000 mg | ORAL_TABLET | Freq: Two times a day (BID) | ORAL | 2 refills | Status: DC | PRN

## 2020-06-26 MED ORDER — DULOXETINE HCL 60 MG PO CPEP
60.0000 mg | ORAL_CAPSULE | Freq: Every day | ORAL | 1 refills | Status: DC
Start: 2020-06-26 — End: 2021-01-01

## 2020-06-26 MED ORDER — ONETOUCH DELICA PLUS LANCET33G MISC: 5 refills | Status: DC

## 2020-06-26 MED ORDER — LAMOTRIGINE 150 MG PO TABS: 150.0000 mg | ORAL_TABLET | Freq: Every day | ORAL | 1 refills | Status: DC

## 2020-06-26 MED ORDER — HYDROCODONE-ACETAMINOPHEN 7.5-325 MG PO TABS
1.0000 | ORAL_TABLET | Freq: Three times a day (TID) | ORAL | 0 refills | Status: DC
Start: 2020-08-25 — End: 2020-09-28

## 2020-06-26 MED ORDER — HYDROCODONE-ACETAMINOPHEN 7.5-325 MG PO TABS: 1.0000 | ORAL_TABLET | Freq: Three times a day (TID) | ORAL | 0 refills | Status: DC

## 2020-06-26 MED ORDER — CARVEDILOL 6.25 MG PO TABS: 6.2500 mg | ORAL_TABLET | Freq: Two times a day (BID) | ORAL | 1 refills | Status: DC

## 2020-06-26 MED ORDER — BUTALBITAL-APAP-CAFFEINE 50-325-40 MG PO TABS
1.0000 | ORAL_TABLET | Freq: Four times a day (QID) | ORAL | 2 refills | Status: DC | PRN
Start: 2020-06-26 — End: 2020-09-03

## 2020-06-26 MED ORDER — EMPAGLIFLOZIN 25 MG PO TABS
25.0000 mg | ORAL_TABLET | Freq: Every day | ORAL | 3 refills | Status: DC
Start: 2020-06-26 — End: 2021-01-01

## 2020-06-26 MED ORDER — VALACYCLOVIR HCL 500 MG PO TABS
500.0000 mg | ORAL_TABLET | Freq: Two times a day (BID) | ORAL | 1 refills | Status: DC
Start: 2020-06-26 — End: 2020-11-01

## 2020-06-26 MED ORDER — ONETOUCH VERIO VI STRP: ORAL_STRIP | 3 refills | Status: DC

## 2020-06-26 MED ORDER — ISOSORBIDE MONONITRATE ER 30 MG PO TB24
30.0000 mg | ORAL_TABLET | Freq: Every day | ORAL | 1 refills | Status: DC
Start: 2020-06-26 — End: 2020-09-04

## 2020-06-26 MED ORDER — LEVETIRACETAM 500 MG PO TABS: ORAL_TABLET | ORAL | 1 refills | Status: DC

## 2020-06-26 MED ORDER — SUCRALFATE 1 G PO TABS: 1.0000 g | ORAL_TABLET | Freq: Two times a day (BID) | ORAL | 1 refills | Status: DC

## 2020-06-26 MED ORDER — HYDROCODONE-ACETAMINOPHEN 7.5-325 MG PO TABS: 1.0000 | ORAL_TABLET | Freq: Three times a day (TID) | ORAL | 0 refills | Status: AC

## 2020-06-26 MED ORDER — ROSUVASTATIN CALCIUM 10 MG PO TABS
10.0000 mg | ORAL_TABLET | Freq: Every day | ORAL | 1 refills | Status: DC
Start: 2020-06-26 — End: 2021-01-01

## 2020-06-26 MED ORDER — DEXLANSOPRAZOLE 60 MG PO CPDR: 1.0000 | DELAYED_RELEASE_CAPSULE | Freq: Every day | ORAL | 1 refills | Status: DC

## 2020-06-26 NOTE — Progress Notes (Addendum)
Subjective:    Patient ID: [name], female    DOB: [birth year], 65 y.o.   MRN: [id number]   Chief Complaint: medical management of chronic issues     HPI:  1. Essential hypertension No c/o chestpain, sob or headache. Does not check bloodpresure at home. BP Readings from Last 3 Encounters:  06/26/20 114/78  06/07/20 120/80  03/28/20 (!) 135/92     2. Chronic diastolic CHF (congestive heart failure) ([place]) She is doing well. No SOB. Saw cardiology 01/13/20 and no chages were made to plan of care according to office note.  3. Obstructive sleep apnea syndrome SHe does not have a machine. She says no one gave her one.  4. Gastroesophageal reflux disease with esophagitis without hemorrhage Takes carafate and dexilant daily. Cannot go without medications.  5. Diverticulosis No recent flare ups.  6. Age-related osteoporosis without current pathological fracture Last dexascan was done 04/16/17. The spine center is scheduling her for dexascan  7. Hyperlipidemia with target LDL less than 100 Doe snt watch diet but appetite has not been good the last several months  8. Herpes simplex virus (HSV) infection No recent flare ups. Takes valtrex daily  9. Recurrent major depressive disorder, in partial remission ([place]) Is on lexapro and is doing well. Depression screen [facility] 2/9 06/26/2020 03/28/2020 01/26/2020  Decreased Interest 0 0 1  Down, Depressed, Hopeless 0 0 0  PHQ - 2 Score 0 0 1  Altered sleeping 3 - 3  Tired, decreased energy 3 - 3  Change in appetite 0 - 0  Feeling bad or failure about yourself  0 - 0  Trouble concentrating 0 - 0  Moving slowly or fidgety/restless 0 - 0  Suicidal thoughts 0 - 0  PHQ-9 Score 6 - 7  Difficult doing work/chores Not difficult at all - Not difficult at all  Some recent data might be hidden     10. Seizures ([place]) Has had no seizure activity. Is still on keppra daily   11. GAD (generalized anxiety disorder) Takes lamictal  dialy. keeops mood swings under control GAD 7 : Generalized Anxiety Score 06/26/2020 03/28/2020 01/26/2020 11/10/2019  Nervous, Anxious, on Edge 3 3 0 2  Control/stop worrying 3 3 0 3  Worry too much - different things 3 3 0 3  Trouble relaxing 1 1 1 2   Restless 0 0 0 0  Easily annoyed or irritable 1 0 0 2  Afraid - awful might happen 0 0 0 0  Total GAD 7 Score 11 10 1 12   Anxiety Difficulty Not difficult at all Not difficult at all Not difficult at all Not difficult at all      12. Pre-diabetes Does not check blod sugars at [facility]. Lab Results  Component Value Date   HGBA1C 6.8 (H) 11/08/2019     13. Overweight (BMI 25.0-29.9) Wt Readings from Last 3 Encounters:  06/26/20 134 lb (60.8 kg)  06/07/20 134 lb 9.6 oz (61.1 kg)  03/28/20 141 lb (64 kg)    BMI Readings from Last 3 Encounters:  06/26/20 24.51 kg/m  06/07/20 24.62 kg/m  03/28/20 25.79 kg/m      Outpatient Encounter Medications as of 06/26/2020  Medication Sig  . acyclovir ointment (ZOVIRAX) 5 % APPLY TOPICALLY EVERY 3 HOURS.  [name] albuterol (PROVENTIL) (2.5 MG/3ML) 0.083% nebulizer solution Take 3 mLs (2.5 mg total) by nebulization every 4 (four) hours as needed for wheezing or shortness of breath.  [name] albuterol (VENTOLIN HFA) 108 (90 Base)  MCG/ACT inhaler INHALE 2 PUFFS EVERY 6 HOURS AS NEEDED FOR SHORTNESS OF BREATH AND WHEEZING.  [name] Alcohol Swabs (GLOBAL ALCOHOL PREP EASE) 70 % PADS Test BS daily R73.03  . busPIRone (BUSPAR) 10 MG tablet TAKE 1 TABLET BY MOUTH TWICE DAILY AS NEEDED.  [name] butalbital-acetaminophen-caffeine (FIORICET) 50-325-40 MG tablet TAKE 1 TABLET BY MOUTH EVERY 6 HOURS AS NEEDED FOR HEADACHE.  . butalbital-aspirin-caffeine-codeine (FIORINAL/CODEINE #3) 50-325-40-30 MG capsule Take 1 capsule by mouth every 4 (four) hours as needed for pain.  . carvedilol (COREG) 6.25 MG tablet TAKE 1 1/2 TABLETS BY MOUTH TWICE DAILY.  . celecoxib (CELEBREX) 200 MG capsule Take 1 capsule (200 mg total) by mouth 2  (two) times daily between meals as needed.  [name] dexlansoprazole (DEXILANT) 60 MG capsule Take 1 capsule (60 mg total) by mouth daily.  . diclofenac Sodium (VOLTAREN) 1 % GEL Apply 4 g topically 4 (four) times daily. (Patient taking differently: Apply 4 g topically 4 (four) times daily as needed (pain).)  . DULoxetine (CYMBALTA) 60 MG capsule Take 1 capsule (60 mg total) by mouth at bedtime.  . empagliflozin (JARDIANCE) 25 MG TABS tablet Take 1 tablet (25 mg total) by mouth daily before breakfast.  . escitalopram (LEXAPRO) 20 MG tablet TAKE 1 TABLET ONCE DAILY.  [name] estradiol (ESTRACE) 0.1 MG/GM vaginal cream Place 1 Applicatorful vaginally at bedtime.  [name] FIBER PO Take 2 tablets by mouth in the morning, at noon, and at bedtime.  . fludrocortisone (FLORINEF) 0.1 MG tablet TAKE 1 TABLET EVERY OTHER DAY.  . fluticasone (FLONASE) 50 MCG/ACT nasal spray USE 1 SPRAY IN EACH NOSTRIL ONCE DAILY.  . fluticasone furoate-vilanterol (BREO ELLIPTA) 200-25 MCG/INH AEPB Inhale 1 puff into the lungs daily.  . furosemide (LASIX) 40 MG tablet TAKE 1 TABLET ONCE DAILY AS NEEDED FOR FLUID.  [name] glucose blood (ONETOUCH VERIO) test strip Test BS daily R73.03  . hydrALAZINE (APRESOLINE) 25 MG tablet TAKE 1 TABLET BY MOUTH 3 TIMES DAILY AS NEEDED (SEVERE HYPERTENSION/SYSTOLIC NUMBER 027 OR GREATER).  [name] HYDROcodone-acetaminophen (NORCO) 7.5-325 MG tablet Take 1 tablet by mouth in the morning, at noon, and at bedtime.  [name] ipratropium (ATROVENT) 0.02 % nebulizer solution USE 1 VIAL (3ML) IN NEBULIZER EVERY 6 HOURS AS NEEDED FOR WHEEZING OR SHORTNESS OF BREATH.  . isosorbide mononitrate (IMDUR) 30 MG 24 hr tablet Take 1 tablet (30 mg total) by mouth daily.  [name] lamoTRIgine (LAMICTAL) 150 MG tablet Take 1 tablet (150 mg total) by mouth daily.  . Lancets (ONETOUCH DELICA PLUS [id number]) MISC USE TO CHECK SUGAR DAILY.  [name] levETIRAcetam (KEPPRA) 500 MG tablet TAKE (1) TABLET TWICE DAILY.  [name] levocetirizine (XYZAL) 5 MG tablet TAKE 1 TABLET  BY MOUTH EVERY MORNING.  [name] lidocaine (XYLOCAINE) 5 % ointment APPLY TO AFFECTED AREA 3 TIMES ADAY AS NEEDED FOR MILD OR MODERATE PAIN.  . methocarbamol (ROBAXIN) 500 MG tablet Take 1 tablet (500 mg total) by mouth every 6 (six) hours as needed.  . mupirocin ointment (BACTROBAN) 2 % Apply to affected area twice daily  . nitroGLYCERIN (NITROSTAT) 0.4 MG SL tablet PLACE ONE (1) TABLET UNDER TONGUE EVERY 5 MINUTES UP TO (3) DOSES AS NEEDED FOR CHEST PAIN.  [name] ondansetron (ZOFRAN) 4 MG tablet TAKE 1 TABLET BY MOUTH EVERY 8 HOURS AS NEEDED FOR NAUSEA AND VOMITING.  . rosuvastatin (CRESTOR) 10 MG tablet Take 1 tablet (10 mg total) by mouth daily.  . sucralfate (CARAFATE) 1 g tablet TAKE 1 TABLET 4 TIMES DAILY WITH MEALS  AND AT BEDTIME.  . traZODone (DESYREL) 150 MG tablet Take 1 tablet (150 mg total) by mouth at bedtime.  . valACYclovir (VALTREX) 1000 MG tablet TAKE 1 TABLET 3 TIMES A DAY. TO BE TAKEN ONLY WHEN SHE HAS OUT BREAK.  . valACYclovir (VALTREX) 500 MG tablet Take 1 tablet (500 mg total) by mouth 2 (two) times daily.   No facility-administered encounter medications on file as of 06/26/2020.    Past Surgical History:  Procedure Laterality Date  . BACK SURGERY    . BREAST SURGERY     lumpectomy  . CARDIAC CATHETERIZATION  10/06/2011  . CATARACT EXTRACTION W/PHACO Right 07/31/2017   Procedure: CATARACT EXTRACTION PHACO AND INTRAOCULAR LENS PLACEMENT (IOC);  Surgeon: [name], MD;  Location: [facility];  Service: Ophthalmology;  Laterality: Right;  CDE: 2.33  . CATARACT EXTRACTION W/PHACO Left 08/14/2017   Procedure: CATARACT EXTRACTION PHACO AND INTRAOCULAR LENS PLACEMENT (IOC);  Surgeon: [name], MD;  Location: [facility];  Service: Ophthalmology;  Laterality: Left;  CDE: 2.74  . CHOLECYSTECTOMY    . CYSTOSCOPY     stone  . DIAGNOSTIC LAPAROSCOPY     laparoscopic cholecystectomy  . DOPPLER ECHOCARDIOGRAPHY  2009  . head up tilt table testing  06/15/2007   [name]  . HEMORRHOID  SURGERY    . insertion of implatable loop recorder  08/11/2007   [name]  . POSTERIOR CERVICAL FUSION/FORAMINOTOMY N/A 12/19/2013   Procedure: RIGHT C3-4.C4-5 AND C5-6 FORAMINOTOMIES;  Surgeon: [name], MD;  Location: [facility];  Service: Orthopedics;  Laterality: N/A;  . TOTAL HIP ARTHROPLASTY Left 01/31/2020   Procedure: LEFT TOTAL HIP ARTHROPLASTY ANTERIOR APPROACH;  Surgeon: Mcarthur Rossetti, MD;  Location: [facility];  Service: Orthopedics;  Laterality: Left;  . TOTAL SHOULDER ARTHROPLASTY Right 11/15/2019   Procedure: RIGHT TOTAL SHOULDER ARTHROPLASTY;  Surgeon: Meredith Pel, MD;  Location: [facility];  Service: Orthopedics;  Laterality: Right;  . TUBAL LIGATION      Family History  Problem Relation Age of Onset  . Heart attack Father   . Mental illness Father   . Mental illness Mother   . Heart attack Brother        stents  . Alcohol abuse Brother   . Heart disease Brother   . Drug abuse Brother   . Diabetes Brother   . Colon cancer Maternal Aunt   . Cirrhosis Brother   . Stomach cancer Neg Hx     New complaints: -Is seeing [facility] for her back fractures. Not sure exactly what they are going to do for her but she has follow up soon. - urine has been very dark  Social history: Her granddaughter lives with her  Controlled substance contract: 04/03/20    Review of Systems  Constitutional: Negative for diaphoresis.  Eyes: Negative for pain.  Respiratory: Negative for shortness of breath.   Cardiovascular: Negative for chest pain, palpitations and leg swelling.  Gastrointestinal: Negative for abdominal pain.  Endocrine: Negative for polydipsia.  Musculoskeletal: Positive for back pain.  Skin: Negative for rash.  Neurological: Negative for dizziness, weakness and headaches.  Hematological: Does not bruise/bleed easily.  All other systems reviewed and are negative.      Objective:   Physical Exam Vitals and nursing note reviewed.   Constitutional:      General: She is not in acute distress.    Appearance: Normal appearance. She is well-developed.  HENT:     Head: Normocephalic.  Nose: Nose normal.  Eyes:     Pupils: Pupils are equal, round, and reactive to light.  Neck:     Vascular: No carotid bruit or JVD.  Cardiovascular:     Rate and Rhythm: Normal rate and regular rhythm.     Heart sounds: Normal heart sounds.  Pulmonary:     Effort: Pulmonary effort is normal. No respiratory distress.     Breath sounds: Normal breath sounds. No wheezing or rales.  Chest:     Chest wall: No tenderness.  Abdominal:     General: Bowel sounds are normal. There is no distension or abdominal bruit.     Palpations: Abdomen is soft. There is no hepatomegaly, splenomegaly, mass or pulsatile mass.     Tenderness: There is no abdominal tenderness.  Musculoskeletal:        General: Normal range of motion.     Cervical back: Normal range of motion and neck supple.     Comments: Rises slowly from sitting to satnding  Lymphadenopathy:     Cervical: No cervical adenopathy.  Skin:    General: Skin is warm and dry.  Neurological:     Mental Status: She is alert and oriented to person, place, and time.     Deep Tendon Reflexes: Reflexes are normal and symmetric.  Psychiatric:        Behavior: Behavior normal.        Thought Content: Thought content normal.        Judgment: Judgment normal.    BP 114/78   Pulse 62   Temp 98 F (36.7 C) (Temporal)   Resp 20   Ht 5' 2"  (1.575 m)   Wt 134 lb (60.8 kg)   SpO2 96%   BMI 24.51 kg/m   Urine-  No infection      Assessment & Plan:  [name] comes in today with chief complaint of Medical Management of Chronic Issues   Diagnosis and orders addressed:  1. Essential hypertension Low sodium diet - CBC with Differential/Platelet - CMP14+EGFR - furosemide (LASIX) 40 MG tablet; Take 1 tablet (40 mg total) by mouth daily.  Dispense: 90 tablet; Refill: 1  2.  Chronic diastolic CHF (congestive heart failure) ([place]) Keep follow up with cardiology yearly  3. Obstructive sleep apnea syndrome Doe snt have CPAP- will try to find out if seis suppose tohave  4. Gastroesophageal reflux disease with esophagitis without hemorrhage Avoid spicy foods Do not eat 2 hours prior to bedtime - sucralfate (CARAFATE) 1 g tablet; Take 1 tablet (1 g total) by mouth 2 (two) times daily.  Dispense: 90 tablet; Refill: 1 - dexlansoprazole (DEXILANT) 60 MG capsule; Take 1 capsule (60 mg total) by mouth daily.  Dispense: 90 capsule; Refill: 1  5. Diverticulosis watch diet to prevent flare ups  6. Age-related osteoporosis without current pathological fracture Spine and scoliosis cnter is to do dexascan  7. Hyperlipidemia with target LDL less than 100 Low fat diet - Lipid panel - rosuvastatin (CRESTOR) 10 MG tablet; Take 1 tablet (10 mg total) by mouth daily.  Dispense: 90 tablet; Refill: 1  8. Herpes simplex virus (HSV) infection Stress management to prevent flare up - valACYclovir (VALTREX) 500 MG tablet; Take 1 tablet (500 mg total) by mouth 2 (two) times daily.  Dispense: 180 tablet; Refill: 1  9. Recurrent major depressive disorder, in partial remission ([facility]) - escitalopram (LEXAPRO) 20 MG tablet; Take 1 tablet (20 mg total) by mouth daily.  Dispense: 90 tablet; Refill:  1 - DULoxetine (CYMBALTA) 60 MG capsule; Take 1 capsule (60 mg total) by mouth at bedtime.  Dispense: 90 capsule; Refill: 1 - empagliflozin (JARDIANCE) 25 MG TABS tablet; Take 1 tablet (25 mg total) by mouth daily before breakfast.  Dispense: 90 tablet; Refill: 3  10. Seizures ([facility]) - levETIRAcetam (KEPPRA) 500 MG tablet; TAKE (1) TABLET TWICE DAILY.  Dispense: 180 tablet; Refill: 1  11. GAD (generalized anxiety disorder) - lamoTRIgine (LAMICTAL) 150 MG tablet; Take 1 tablet (150 mg total) by mouth daily.  Dispense: 90 tablet; Refill: 1 - busPIRone (BUSPAR) 10 MG tablet; Take 1 tablet (10 mg  total) by mouth 2 (two) times daily as needed.  Dispense: 60 tablet; Refill: 2  12. Pre-diabetes Watch carbs in diet - Bayer DCA Hb A1c Waived  13. Overweight (BMI 25.0-29.9) Discussed diet and exercise for person with BMI >25 Will recheck weight in 3-6 months  14. Dark urine Force fluids - Urinalysis, Complete  15. Allergic rhinitis, unspecified seasonality, unspecified trigger - fluticasone furoate-vilanterol (BREO ELLIPTA) 200-25 MCG/INH AEPB; Inhale 1 puff into the lungs daily.  Dispense: 60 each; Refill: 5   16. Chronic tension-type headache, not intractable - butalbital-acetaminophen-caffeine (FIORICET) 50-325-40 MG tablet; Take 1 tablet by mouth every 6 (six) hours as needed for headache.  Dispense: 20 tablet; Refill: 2   17. Other chest pain - isosorbide mononitrate (IMDUR) 30 MG 24 hr tablet; Take 1 tablet (30 mg total) by mouth daily.  Dispense: 90 tablet; Refill: 1  18. Other chronic pain - HYDROcodone-acetaminophen (NORCO) 7.5-325 MG tablet; Take 1 tablet by mouth in the morning, at noon, and at bedtime.  Dispense: 90 tablet; Refill: 0 - HYDROcodone-acetaminophen (NORCO) 7.5-325 MG tablet; Take 1 tablet by mouth in the morning, at noon, and at bedtime.  Dispense: 90 tablet; Refill: 0 - HYDROcodone-acetaminophen (NORCO) 7.5-325 MG tablet; Take 1 tablet by mouth in the morning, at noon, and at bedtime.  Dispense: 90 tablet; Refill: 0  19. Primary insomnia - traZODone (DESYREL) 150 MG tablet; Take 1 tablet (150 mg total) by mouth at bedtime.  Dispense: 90 tablet; Refill: 1   Labs pending Health Maintenance reviewed Diet and exercise encouraged  Follow up plan: 3 months   [name] Hassell Done, FNP

## 2020-06-27 LAB — CMP14+EGFR
ALT: 27 IU/L (ref 0–32)
AST: 20 IU/L (ref 0–40)
Albumin/Globulin Ratio: 1.8 (ref 1.2–2.2)
Albumin: 4.4 g/dL (ref 3.8–4.8)
Alkaline Phosphatase: 129 IU/L — ABNORMAL HIGH (ref 44–121)
BUN/Creatinine Ratio: 19 (ref 12–28)
BUN: 14 mg/dL (ref 8–27)
Bilirubin Total: 0.4 mg/dL (ref 0.0–1.2)
CO2: 24 mmol/L (ref 20–29)
Calcium: 9.5 mg/dL (ref 8.7–10.3)
Chloride: 105 mmol/L (ref 96–106)
Creatinine, Ser: 0.72 mg/dL (ref 0.57–1.00)
Globulin, Total: 2.5 g/dL (ref 1.5–4.5)
Glucose: 77 mg/dL (ref 65–99)
Potassium: 4.1 mmol/L (ref 3.5–5.2)
Sodium: 144 mmol/L (ref 134–144)
Total Protein: 6.9 g/dL (ref 6.0–8.5)
eGFR: 94 mL/min/{1.73_m2} (ref >59)

## 2020-06-27 LAB — CBC WITH DIFFERENTIAL/PLATELET
Basophils Absolute: 0 10*3/uL (ref 0.0–0.2)
Basos: 1 %
EOS (ABSOLUTE): 0.3 10*3/uL (ref 0.0–0.4)
Eos: 5 %
Hematocrit: 44.1 % (ref 34.0–46.6)
Hemoglobin: 14.8 g/dL (ref 11.1–15.9)
Immature Grans (Abs): 0 10*3/uL (ref 0.0–0.1)
Immature Granulocytes: 1 %
Lymphocytes Absolute: 2.5 10*3/uL (ref 0.7–3.1)
Lymphs: 45 %
MCH: 31.2 pg (ref 26.6–33.0)
MCHC: 33.6 g/dL (ref 31.5–35.7)
MCV: 93 fL (ref 79–97)
Monocytes Absolute: 0.4 10*3/uL (ref 0.1–0.9)
Monocytes: 8 %
Neutrophils Absolute: 2.2 10*3/uL (ref 1.4–7.0)
Neutrophils: 40 %
Platelets: 209 10*3/uL (ref 150–450)
RBC: 4.74 x10E6/uL (ref 3.77–5.28)
RDW: 16.2 % — ABNORMAL HIGH (ref 11.7–15.4)
WBC: 5.4 10*3/uL (ref 3.4–10.8)

## 2020-06-27 LAB — LIPID PANEL
Chol/HDL Ratio: 3.1 ratio (ref 0.0–4.4)
Cholesterol, Total: 162 mg/dL (ref 100–199)
HDL: 52 mg/dL (ref >39)
LDL Chol Calc (NIH): 88 mg/dL (ref 0–99)
Triglycerides: 124 mg/dL (ref 0–149)
VLDL Cholesterol Cal: 22 mg/dL (ref 5–40)

## 2020-06-28 ENCOUNTER — Other Ambulatory Visit: Payer: Self-pay | Admitting: Family Medicine

## 2020-06-28 ENCOUNTER — Other Ambulatory Visit: Payer: Self-pay | Admitting: Orthopaedic Surgery

## 2020-06-28 ENCOUNTER — Other Ambulatory Visit: Payer: Self-pay | Admitting: Nurse Practitioner

## 2020-07-02 ENCOUNTER — Encounter: Payer: Self-pay | Admitting: Physical Medicine and Rehabilitation

## 2020-07-02 MED ORDER — TRIAMCINOLONE ACETONIDE 40 MG/ML IJ SUSP
40.0000 mg | INTRAMUSCULAR | Status: AC | PRN
  Administered 2020-05-28: 40 mg via INTRA_ARTICULAR

## 2020-07-02 MED ORDER — BUPIVACAINE HCL 0.25 % IJ SOLN
4.0000 mL | INTRAMUSCULAR | Status: AC | PRN
  Administered 2020-05-28: 4 mL via INTRA_ARTICULAR

## 2020-07-25 ENCOUNTER — Other Ambulatory Visit: Payer: Self-pay

## 2020-07-25 ENCOUNTER — Ambulatory Visit (AMBULATORY_SURGERY_CENTER): Payer: 59 | Admitting: Gastroenterology

## 2020-07-25 ENCOUNTER — Encounter: Payer: Self-pay | Admitting: Gastroenterology

## 2020-07-25 ENCOUNTER — Telehealth: Payer: Self-pay

## 2020-07-25 VITALS — BP 102/72 | HR 51 | Temp 98.0°F | Resp 27 | Ht 62.0 in | Wt 134.0 lb

## 2020-07-25 DIAGNOSIS — R1319 Other dysphagia: Secondary | ICD-10-CM

## 2020-07-25 DIAGNOSIS — K219 Gastro-esophageal reflux disease without esophagitis: Secondary | ICD-10-CM

## 2020-07-25 DIAGNOSIS — K449 Diaphragmatic hernia without obstruction or gangrene: Secondary | ICD-10-CM | POA: Diagnosis not present

## 2020-07-25 MED ORDER — SODIUM CHLORIDE 0.9 % IV SOLN: 500.0000 mL | Freq: Once | INTRAVENOUS | Status: DC

## 2020-07-25 NOTE — Telephone Encounter (Signed)
Patient has been scheduled for an esophageal manometry at [facility] on Wednesday, 08/08/20 at 10:30 AM. Pre-screening COVID test is scheduled for Monday, 08/06/20 at 1:45 PM. Instructions sent to patient via My Chart and a copy will be mailed to her also. CASE ID: [id number]  Referral, records, demographic and insurance information faxed to [facility].   Lm on vm for patient to return call.

## 2020-07-25 NOTE — Telephone Encounter (Signed)
Patient returned call. Confirmed the times of procedure and COVID test. Expressed understanding. Patient asked if she could take the COVID test earlier in the day.

## 2020-07-25 NOTE — Op Note (Signed)
[facility] Patient Name: [name] Procedure Date: 07/25/2020 10:26 AM MRN: [id number] Endoscopist: Sherilyn Cooter [name] , MD Age: 65 Referring MD:  Date of Birth: 1955/08/11 Gender: Female Account #: [id number] Procedure:                Upper GI endoscopy Indications:              Esophageal dysphagia, Heartburn (persisting despite                            acid suppression therapy), Hiatal hernia Medicines:                Monitored Anesthesia Care Procedure:                Pre-Anesthesia Assessment:                           - Prior to the procedure, a History and Physical                            was performed, and patient medications and                            allergies were reviewed. The patient's tolerance of                            previous anesthesia was also reviewed. The risks                            and benefits of the procedure and the sedation                            options and risks were discussed with the patient.                            All questions were answered, and informed consent                            was obtained. Prior Anticoagulants: The patient has                            taken no previous anticoagulant or antiplatelet                            agents. ASA Grade Assessment: III - A patient with                            severe systemic disease. After reviewing the risks                            and benefits, the patient was deemed in                            satisfactory condition to undergo the procedure.  After obtaining informed consent, the endoscope was                            passed under direct vision. Throughout the                            procedure, the patient's blood pressure, pulse, and                            oxygen saturations were monitored continuously. The                            Endoscope was introduced through the mouth, and                            advanced  to the second part of duodenum. The upper                            GI endoscopy was accomplished without difficulty.                            The patient tolerated the procedure well. Scope In: Scope Out: Findings:                 The examined esophagus was tortuous.                           An 8-10 cm hiatal hernia was present.(both axial                            and transverse dimension)                           There is no endoscopic evidence of Barrett's                            esophagus, esophagitis or stricture in the entire                            esophagus.                           Multiple erosions with stigmata of recent bleeding                            were found in the gastric body. ("C[place]on" erosions                            from hiatal hernia - see photos)                           The exam of the stomach was otherwise normal.                           The examined duodenum was normal. Complications:  No immediate complications. Estimated Blood Loss:     Estimated blood loss: none. Impression:               - Tortuous esophagus. (due to hiatal hernia)                           - 8 to 10 cm hiatal hernia.                           - Gastric erosions with stigmata of recent bleeding.                           - Normal examined duodenum.                           - No specimens collected.                           No focal stricture amenable to endoscopic dilation.                            Dysphagia from hernia-related esophageal                            tortuosity. Concomitant esophageal motility                            disorder is also possible. Recommendation:           - Patient has a contact number available for                            emergencies. The signs and symptoms of potential                            delayed complications were discussed with the                            patient. Return to normal activities tomorrow.                             Written discharge instructions were provided to the                            patient.                           - Resume previous diet.                           - Continue present medications.                           - Perform ambulatory esophageal manometry at                            appointment to be scheduled.                           -  Refer to a surgeon at appointment to be scheduled                            to discuss hernia repair/fundoplication. Erica Nardo L. Myrtie Neither, MD 07/25/2020 10:53:58 AM This report has been signed electronically.

## 2020-07-25 NOTE — Patient Instructions (Signed)
Discharge instructions given. Handout on Hiatal Hernia. Office will schedule esophageal manometry and refer to surgeon. Resume previous ,medications. YOU HAD AN ENDOSCOPIC PROCEDURE TODAY AT THE [facility]:   Refer to the procedure report that was given to you for any specific questions about what was found during the examination.  If the procedure report does not answer your questions, please call your gastroenterologist to clarify.  If you requested that your care partner not be given the details of your procedure findings, then the procedure report has been included in a sealed envelope for you to review at your convenience later.  YOU SHOULD EXPECT: Some feelings of bloating in the abdomen. Passage of more gas than usual.  Walking can help get rid of the air that was put into your GI tract during the procedure and reduce the bloating. If you had a lower endoscopy (such as a colonoscopy or flexible sigmoidoscopy) you may notice spotting of blood in your stool or on the toilet paper. If you underwent a bowel prep for your procedure, you may not have a normal bowel movement for a few days.  Please Note:  You might notice some irritation and congestion in your nose or some drainage.  This is from the oxygen used during your procedure.  There is no need for concern and it should clear up in a day or so.  SYMPTOMS TO REPORT IMMEDIATELY:    Following upper endoscopy (EGD)  Vomiting of blood or coffee ground material  New chest pain or pain under the shoulder blades  Painful or persistently difficult swallowing  New shortness of breath  Fever of 100F or higher  Black, tarry-looking stools  For urgent or emergent issues, a gastroenterologist can be reached at any hour by calling [phone]. Do not use MyChart messaging for urgent concerns.    DIET:  We do recommend a small meal at first, but then you may proceed to your regular diet.  Drink plenty of fluids but you should  avoid alcoholic beverages for 24 hours.  ACTIVITY:  You should plan to take it easy for the rest of today and you should NOT DRIVE or use heavy machinery until tomorrow (because of the sedation medicines used during the test).    FOLLOW UP: Our staff will call the number listed on your records 48-72 hours following your procedure to check on you and address any questions or concerns that you may have regarding the information given to you following your procedure. If we do not reach you, we will leave a message.  We will attempt to reach you two times.  During this call, we will ask if you have developed any symptoms of COVID 19. If you develop any symptoms (ie: fever, flu-like symptoms, shortness of breath, cough etc.) before then, please call [phone].  If you test positive for Covid 19 in the 2 weeks post procedure, please call and report this information to [name].    If any biopsies were taken you will be contacted by phone or by letter within the next 1-3 weeks.  Please call us at [phone] if you have not heard about the biopsies in 3 weeks.    SIGNATURES/CONFIDENTIALITY: You and/or your care partner have signed paperwork which will be entered into your electronic medical record.  These signatures attest to the fact that that the information above on your After Visit Summary has been reviewed and is understood.  Full responsibility of the confidentiality of this discharge information  lies with you and/or your care-partner.

## 2020-07-25 NOTE — Telephone Encounter (Signed)
Patient confirmed that she has received the information via My chart.

## 2020-07-25 NOTE — Progress Notes (Signed)
Pt Drowsy. VSS. To PACU, report to RN. No anesthetic complications noted.  

## 2020-07-25 NOTE — Telephone Encounter (Signed)
-----   Message from Charlie Pitter III, MD sent at 07/25/2020 11:56 AM EDT ----- Please see EGD report from today and arrange:  1 - Esophageal manometry for dysphagia  2 - Referral to [facility] Derrell Lolling or Andrey Campanile) for symptomatic hiatal hernia, dysphagia, GERD

## 2020-07-25 NOTE — Telephone Encounter (Addendum)
Lm on vm for patient to return call.  Pre-screening COVID test has been rescheduled to Monday, 08/06/20 at 10:45 AM. Need to confirm that patient has received instructions.

## 2020-07-25 NOTE — Progress Notes (Signed)
VS-CW 

## 2020-07-27 ENCOUNTER — Telehealth: Payer: Self-pay | Admitting: *Deleted

## 2020-07-27 ENCOUNTER — Other Ambulatory Visit: Payer: Self-pay | Admitting: Nurse Practitioner

## 2020-07-27 ENCOUNTER — Telehealth: Payer: Self-pay

## 2020-07-27 DIAGNOSIS — G8929 Other chronic pain: Secondary | ICD-10-CM

## 2020-07-27 NOTE — Telephone Encounter (Signed)
Attempted f/u phone call. No answer. Left message. °

## 2020-07-27 NOTE — Telephone Encounter (Signed)
Left message on 2nd follow up call. 

## 2020-07-31 ENCOUNTER — Other Ambulatory Visit: Payer: Self-pay | Admitting: Cardiology

## 2020-07-31 ENCOUNTER — Other Ambulatory Visit: Payer: Self-pay | Admitting: Nurse Practitioner

## 2020-07-31 ENCOUNTER — Other Ambulatory Visit: Payer: Self-pay | Admitting: Orthopaedic Surgery

## 2020-07-31 DIAGNOSIS — B009 Herpesviral infection, unspecified: Secondary | ICD-10-CM

## 2020-08-06 ENCOUNTER — Other Ambulatory Visit (HOSPITAL_COMMUNITY): Payer: 59

## 2020-08-06 ENCOUNTER — Other Ambulatory Visit (HOSPITAL_COMMUNITY)
Admission: RE | Admit: 2020-08-06 | Discharge: 2020-08-06 | Disposition: A | Discharge status: Home / Self Care | Payer: 59 | Source: Ambulatory Visit | Attending: Gastroenterology | Admitting: Gastroenterology

## 2020-08-06 DIAGNOSIS — Z20822 Contact with and (suspected) exposure to covid-19: Secondary | ICD-10-CM | POA: Insufficient documentation

## 2020-08-06 DIAGNOSIS — Z01812 Encounter for preprocedural laboratory examination: Secondary | ICD-10-CM | POA: Insufficient documentation

## 2020-08-06 LAB — SARS CORONAVIRUS 2 (TAT 6-24 HRS): SARS Coronavirus 2: NEGATIVE

## 2020-08-08 ENCOUNTER — Encounter (HOSPITAL_COMMUNITY)
Admission: RE | Disposition: A | Discharge status: Home / Self Care | Payer: Self-pay | Source: Home / Self Care | Attending: Gastroenterology

## 2020-08-08 ENCOUNTER — Ambulatory Visit (HOSPITAL_COMMUNITY)
Admission: RE | Admit: 2020-08-08 | Discharge: 2020-08-08 | Disposition: A | Discharge status: Home / Self Care | Payer: 59 | Attending: Gastroenterology | Admitting: Gastroenterology

## 2020-08-08 ENCOUNTER — Encounter (HOSPITAL_COMMUNITY): Payer: Self-pay | Admitting: Gastroenterology

## 2020-08-08 DIAGNOSIS — Z5309 Procedure and treatment not carried out because of other contraindication: Secondary | ICD-10-CM | POA: Insufficient documentation

## 2020-08-08 DIAGNOSIS — R1310 Dysphagia, unspecified: Secondary | ICD-10-CM | POA: Insufficient documentation

## 2020-08-08 SURGERY — INVASIVE LAB ABORTED CASE

## 2020-08-08 MED ORDER — LIDOCAINE VISCOUS HCL 2 % MT SOLN
OROMUCOSAL | Status: AC
  Filled 2020-08-08: qty 15

## 2020-08-08 SURGICAL SUPPLY — 2 items
FACESHIELD LNG OPTICON STERILE (SAFETY) IMPLANT
GLOVE BIO SURGEON STRL SZ8 (GLOVE) ×6 IMPLANT

## 2020-08-08 NOTE — Progress Notes (Signed)
Esophageal manometry attempted.  Able to advance probe to about 45 cm then unable advance into stomach.  Patient did not  tolerated any advancement without gagging, vomiting of water.  Attempted in several different positions sitting, standing and lying on left side. Patient requested to stop.  Emotional support.  Patient aware will send message to Dr. Myrtie Neither and Dr. Lavon Paganini to contact patient with alternatives.

## 2020-08-20 ENCOUNTER — Telehealth: Payer: Self-pay | Admitting: Nurse Practitioner

## 2020-08-21 NOTE — Telephone Encounter (Signed)
Called patient and made appt

## 2020-08-22 ENCOUNTER — Telehealth: Payer: Self-pay | Admitting: *Deleted

## 2020-08-22 NOTE — Telephone Encounter (Signed)
   [facility] Pre-operative Risk Assessment    Patient Name: [name]  DOB: [birth year]  MRN: [id number]   HEARTCARE STAFF: - Please ensure there is not already an duplicate clearance open for this procedure. - Under Visit Info/Reason for Call, type in Other and utilize the format Clearance MM/DD/YY or Clearance TBD. Do not use dashes or single digits. - If request is for dental extraction, please clarify the # of teeth to be extracted.  Request for surgical clearance:  1. What type of surgery is being performed?  HIATAL HERNIA REPAIR   2. When is this surgery scheduled?  July 2022   3. What type of clearance is required (medical clearance vs. Pharmacy clearance to hold med vs. Both)?  MEDICAL  4. Are there any medications that need to be held prior to surgery and how long? N/A   5. Practice name and name of physician performing surgery? [facility] / DR. Redmond Pulling   6. What is the office phone number?  [phone]   7.   What is the office fax number?  [phone] / ATTNShauna Hugh, RN  8.   Anesthesia type (None, local, MAC, general) ?  GENERAL   Jeanann Lewandowsky 08/22/2020, 12:26 PM  _________________________________________________________________   (provider comments below)

## 2020-08-22 NOTE — Telephone Encounter (Signed)
Pt has OV for surgical clearance. Note will be removed from preop pool [place], [place]    08/22/2020 4:36 PM

## 2020-08-22 NOTE — Telephone Encounter (Signed)
Pt has appt with Rennis Harding, NP 09/11/20 for pre op clearance. Clearance notes have been forward to NP for upcoming appt. Will send FYI to requesting office pt has appt 09/11/20.

## 2020-08-22 NOTE — Telephone Encounter (Signed)
   Name: [name]  DOB: [birth year]  MRN: [id number]  Primary Cardiologist: Dina Rich, MD  Chart reviewed as part of pre-operative protocol coverage. Because of [name]'s past medical history and time since last visit, she will require a follow-up visit in order to better assess preoperative cardiovascular risk.  Pre-op covering staff: - Please schedule appointment and call patient to inform them. If patient already had an upcoming appointment within acceptable timeframe, please add "pre-op clearance" to the appointment notes so provider is aware. - Please contact requesting surgeon's office via preferred method (i.e, phone, fax) to inform them of need for appointment prior to surgery.   Tereso Newcomer, PA-C  08/22/2020, 12:55 PM

## 2020-08-24 ENCOUNTER — Ambulatory Visit (INDEPENDENT_AMBULATORY_CARE_PROVIDER_SITE_OTHER): Payer: 59

## 2020-08-24 ENCOUNTER — Other Ambulatory Visit: Payer: Self-pay

## 2020-08-24 DIAGNOSIS — Z780 Asymptomatic menopausal state: Secondary | ICD-10-CM

## 2020-08-28 ENCOUNTER — Other Ambulatory Visit: Payer: Self-pay | Admitting: General Surgery

## 2020-08-28 DIAGNOSIS — K449 Diaphragmatic hernia without obstruction or gangrene: Secondary | ICD-10-CM

## 2020-08-28 DIAGNOSIS — K219 Gastro-esophageal reflux disease without esophagitis: Secondary | ICD-10-CM

## 2020-09-01 ENCOUNTER — Other Ambulatory Visit: Payer: Self-pay | Admitting: Cardiology

## 2020-09-01 ENCOUNTER — Other Ambulatory Visit: Payer: Self-pay | Admitting: Nurse Practitioner

## 2020-09-01 DIAGNOSIS — B009 Herpesviral infection, unspecified: Secondary | ICD-10-CM

## 2020-09-01 DIAGNOSIS — G44229 Chronic tension-type headache, not intractable: Secondary | ICD-10-CM

## 2020-09-03 ENCOUNTER — Other Ambulatory Visit: Payer: Self-pay | Admitting: Nurse Practitioner

## 2020-09-03 DIAGNOSIS — R0789 Other chest pain: Secondary | ICD-10-CM

## 2020-09-04 ENCOUNTER — Ambulatory Visit (INDEPENDENT_AMBULATORY_CARE_PROVIDER_SITE_OTHER): Payer: 59

## 2020-09-04 VITALS — Wt 134.0 lb

## 2020-09-04 DIAGNOSIS — Z1231 Encounter for screening mammogram for malignant neoplasm of breast: Secondary | ICD-10-CM

## 2020-09-04 DIAGNOSIS — Z0000 Encounter for general adult medical examination without abnormal findings: Secondary | ICD-10-CM | POA: Diagnosis not present

## 2020-09-04 NOTE — Progress Notes (Signed)
Subjective:   TANITH DAGOSTINO is a 65 y.o. female who presents for Medicare Annual (Subsequent) preventive examination.  Virtual Visit via Telephone Note  I connected with  [name] on 09/04/20 at 11:15 AM EDT by telephone and verified that I am speaking with the correct person using two identifiers.  Location: Patient: Home Provider: [facility] Persons participating in the virtual visit: patient/Nurse Health Advisor   I discussed the limitations, risks, security and privacy concerns of performing an evaluation and management service by telephone and the availability of in person appointments. The patient expressed understanding and agreed to proceed.  Interactive audio and video telecommunications were attempted between this nurse and patient, however failed, due to patient having technical difficulties OR patient did not have access to video capability.  We continued and completed visit with audio only.  Some vital signs may be absent or patient reported.   Sanae Willetts E Yanisa Goodgame, LPN   Review of Systems     Cardiac Risk Factors include: sedentary lifestyle;hypertension;family history of premature cardiovascular disease;dyslipidemia     Objective:    Today's Vitals   09/04/20 1209 09/04/20 1210  Weight: 134 lb (60.8 kg)   PainSc:  8    Body mass index is 24.51 kg/m.  Advanced Directives 09/04/2020 01/27/2020 11/15/2019 11/08/2019 08/01/2019 04/15/2019 01/18/2019  Does Patient Have a Medical Advance Directive? No No No No No No No  Would patient like information on creating a medical advance directive? No - Patient declined - Yes (MAU/Ambulatory/Procedural Areas - Information given) Yes (MAU/Ambulatory/Procedural Areas - Information given) No - Patient declined No - Patient declined -  Pre-existing out of facility DNR order (yellow form or pink MOST form) - - - - - - -    Current Medications (verified) Outpatient Encounter Medications as of 09/04/2020  Medication Sig  .  ipratropium (ATROVENT) 0.02 % nebulizer solution USE 1 VIAL ( ) IN NEBULIZER EVERY 6 HOURS AS NEEDED FOR WHEEZING OR SHORTNESS OF BREATH.  [name] acyclovir ointment (ZOVIRAX) 5 % APPLY TOPICALLY EVERY 3 HOURS.  [name] albuterol (PROVENTIL) (2.5 MG/3ML) 0.083% nebulizer solution Take 3 mLs (2.5 mg total) by nebulization every 4 (four) hours as needed for wheezing or shortness of breath.  [name] albuterol (VENTOLIN HFA) 108 (90 Base) MCG/ACT inhaler INHALE 2 PUFFS EVERY 6 HOURS AS NEEDED FOR SHORTNESS OF BREATH AND WHEEZING.  [name] Alcohol Swabs (GLOBAL ALCOHOL PREP EASE) 70 % PADS Test BS daily R73.03  . busPIRone (BUSPAR) 10 MG tablet Take 1 tablet (10 mg total) by mouth 2 (two) times daily as needed.  . butalbital-acetaminophen-caffeine (FIORICET) 50-325-40 MG tablet TAKE 1 TABLET BY MOUTH EVERY 6 HOURS AS NEEDED FOR HEADACHE.  . carvedilol (COREG) 6.25 MG tablet TAKE (1) TABLET TWICE DAILY.  . celecoxib (CELEBREX) 200 MG capsule TAKE 1 CAPSULE BY MOUTH TWICE DAILY BETWEEN MEALS AS NEEDED.  [name] dexlansoprazole (DEXILANT) 60 MG capsule Take 1 capsule (60 mg total) by mouth daily.  . diclofenac Sodium (VOLTAREN) 1 % GEL Apply 4 g topically 4 (four) times daily. (Patient taking differently: Apply 4 g topically 4 (four) times daily as needed (pain).)  . DULoxetine (CYMBALTA) 60 MG capsule Take 1 capsule (60 mg total) by mouth at bedtime.  . empagliflozin (JARDIANCE) 25 MG TABS tablet Take 1 tablet (25 mg total) by mouth daily before breakfast.  . escitalopram (LEXAPRO) 20 MG tablet Take 1 tablet (20 mg total) by mouth daily.  [name] estradiol (ESTRACE) 0.1 MG/GM vaginal cream Place 1 Applicatorful vaginally at  bedtime. (Patient not taking: Reported on 07/25/2020)  . FIBER PO Take 2 tablets by mouth in the morning, at noon, and at bedtime.  . fludrocortisone (FLORINEF) 0.1 MG tablet TAKE 1 TABLET EVERY OTHER DAY.  . fluticasone (FLONASE) 50 MCG/ACT nasal spray USE 1 SPRAY IN EACH NOSTRIL ONCE DAILY.  . fluticasone  furoate-vilanterol (BREO ELLIPTA) 200-25 MCG/INH AEPB Inhale 1 puff into the lungs daily.  . furosemide (LASIX) 40 MG tablet Take 1 tablet (40 mg total) by mouth daily.  [name] glucose blood (ONETOUCH VERIO) test strip Test BS daily R73.03  . hydrALAZINE (APRESOLINE) 25 MG tablet TAKE 1 TABLET BY MOUTH 3 TIMES DAILY AS NEEDED (SEVERE HYPERTENSION/SYSTOLIC NUMBER 170 OR GREATER).  [name] HYDROcodone-acetaminophen (NORCO) 7.5-325 MG tablet Take 1 tablet by mouth in the morning, at noon, and at bedtime.  . isosorbide mononitrate (IMDUR) 30 MG 24 hr tablet TAKE 1 TABLET ONCE DAILY.  [name] lamoTRIgine (LAMICTAL) 150 MG tablet Take 1 tablet (150 mg total) by mouth daily.  . Lancets (ONETOUCH DELICA PLUS LANCET33G) MISC USE TO CHECK SUGAR DAILY.  [name] levETIRAcetam (KEPPRA) 500 MG tablet TAKE (1) TABLET TWICE DAILY.  [name] levocetirizine (XYZAL) 5 MG tablet TAKE 1 TABLET BY MOUTH EVERY MORNING.  [name] lidocaine (XYLOCAINE) 5 % ointment APPLY TO AFFECTED AREA 3 TIMES ADAY AS NEEDED FOR MILD OR MODERATE PAIN.  . methocarbamol (ROBAXIN) 500 MG tablet TAKE (1) TABLET BY MOUTH EVERY SIX HOURS AS NEEDED  . mupirocin ointment (BACTROBAN) 2 % APPLY TO AFFECTED AREA TWICE DAILY.  . nitroGLYCERIN (NITROSTAT) 0.4 MG SL tablet PLACE ONE (1) TABLET UNDER TONGUE EVERY 5 MINUTES UP TO (3) DOSES AS NEEDED FOR CHEST PAIN.  [name] ondansetron (ZOFRAN) 4 MG tablet TAKE 1 TABLET BY MOUTH EVERY 8 HOURS AS NEEDED FOR NAUSEA AND VOMITING.  . rosuvastatin (CRESTOR) 10 MG tablet Take 1 tablet (10 mg total) by mouth daily.  . sucralfate (CARAFATE) 1 g tablet Take 1 tablet (1 g total) by mouth 2 (two) times daily.  . traZODone (DESYREL) 150 MG tablet Take 1 tablet (150 mg total) by mouth at bedtime.  . valACYclovir (VALTREX) 1000 MG tablet TAKE 1 TABLET 3 TIMES A DAY. TO BE TAKEN ONLY WHEN SHE HAS OUT BREAK.  . valACYclovir (VALTREX) 500 MG tablet Take 1 tablet (500 mg total) by mouth 2 (two) times daily.   No facility-administered encounter medications on  file as of 09/04/2020.    Allergies (verified) Codeine, Morphine and related, Ambien [zolpidem tartrate], Clonidine derivatives, Metformin and related, Lyrica [pregabalin], and Neurontin [gabapentin]   History: Past Medical History:  Diagnosis Date  . Allergy   . Anemia   . Anginal pain (HCC)    last time   . Anxiety   . Arthritis    RHEUMATOID  . Asthma   . Bipolar 1 disorder (HCC)   . Blood transfusion without reported diagnosis   . Cataracts, bilateral 07/2017  . CHF (congestive heart failure) (HCC)   . COPD (chronic obstructive pulmonary disease) (HCC)   . Coronary artery disease    reported hx of "MI";  Echo 2009 with normal LVF;  Myoview 05/2011: no ischemia  . Depression   . Diabetes mellitus without complication (HCC)   . Dyslipidemia   . Dysrhythmia    SVT  . Esophageal stricture   . Fibromyalgia   . GERD (gastroesophageal reflux disease)   . H/O hiatal hernia   . Head injury, unspecified   . Headache    migraines  .  Herpes simplex infection   . History of kidney stones   . History of loop recorder    Managed by Dr. Sharrell Ku  . Hyperlipidemia   . Hypertension   . Insomnia   . Myocardial infarction [facility])    age 13  . Osteoporosis   . Pneumonia    hx  . Seizures (HCC)   . Shortness of breath   . Sleep apnea    ? neg  . Spinal stenosis of lumbar region   . Spondylolisthesis   . Status post placement of implantable loop recorder   . Supraventricular tachycardia (HCC)   . Syncope and collapse    s/p ILR; no arhythmogenic cause identified  . UTI (lower urinary tract infection)    Past Surgical History:  Procedure Laterality Date  . BACK SURGERY    . BREAST SURGERY     lumpectomy  . CARDIAC CATHETERIZATION  10/06/2011  . CATARACT EXTRACTION W/PHACO Right 07/31/2017   Procedure: CATARACT EXTRACTION PHACO AND INTRAOCULAR LENS PLACEMENT (IOC);  Surgeon: Fabio Pierce, MD;  Location: [facility];  Service: Ophthalmology;  Laterality: Right;  CDE: 2.33  .  CATARACT EXTRACTION W/PHACO Left 08/14/2017   Procedure: CATARACT EXTRACTION PHACO AND INTRAOCULAR LENS PLACEMENT (IOC);  Surgeon: Fabio Pierce, MD;  Location: [facility];  Service: Ophthalmology;  Laterality: Left;  CDE: 2.74  . CHOLECYSTECTOMY    . CYSTOSCOPY     stone  . DIAGNOSTIC LAPAROSCOPY     laparoscopic cholecystectomy  . DOPPLER ECHOCARDIOGRAPHY  2009  . head up tilt table testing  06/15/2007   [name]  . HEMORRHOID SURGERY    . insertion of implatable loop recorder  08/11/2007   [name]  . POSTERIOR CERVICAL FUSION/FORAMINOTOMY N/A 12/19/2013   Procedure: RIGHT C3-4.C4-5 AND C5-6 FORAMINOTOMIES;  Surgeon: Kerrin Champagne, MD;  [facility];  Service: Orthopedics;  Laterality: N/A;  . TOTAL HIP ARTHROPLASTY Left 01/31/2020   Procedure: LEFT TOTAL HIP ARTHROPLASTY ANTERIOR APPROACH;  Surgeon: Kathryne Hitch, MD;  Location: [facility] OR;  Service: Orthopedics;  Laterality: Left;  . TOTAL SHOULDER ARTHROPLASTY Right 11/15/2019   Procedure: RIGHT TOTAL SHOULDER ARTHROPLASTY;  Surgeon: Cammy Copa, MD;  Location: [facility];  Service: Orthopedics;  Laterality: Right;  . TUBAL LIGATION    . UPPER GASTROINTESTINAL ENDOSCOPY     Family History  Problem Relation Age of Onset  . Heart attack Father   . Mental illness Father   . Mental illness Mother   . Heart attack Brother        stents  . Alcohol abuse Brother   . Heart disease Brother   . Drug abuse Brother   . Diabetes Brother   . Colon cancer Maternal Aunt   . Cirrhosis Brother   . Stomach cancer Neg Hx   . Esophageal cancer Neg Hx   . Rectal cancer Neg Hx    Social History   Socioeconomic History  . Marital status: Single    Spouse name: Not on file  . Number of children: 5  . Years of education: Not on file  . Highest education level: 5th grade  Occupational History  . Occupation: Disability    Comment: 15 years  Tobacco Use  . Smoking status: Never Smoker  . Smokeless tobacco: Never Used  Vaping  Use  . Vaping Use: Never used  Substance and Sexual Activity  . Alcohol use: No    Alcohol/week: 0.0 standard drinks  . Drug use: No  . Sexual  activity: Not Currently  Other Topics Concern  . Not on file  Social History Narrative   Patient lives alone - 2 granddaughters live nearby - children are very involved   Patient is on disability.    Patient has 5th grade education.    Patient has 5 children, 24 grand-chilren, and 5 great-grand children.    Right handed    Social Determinants of Health   Financial Resource Strain: Medium Risk  . Difficulty of Paying Living Expenses: Somewhat hard  Food Insecurity: Food Insecurity Present  . Worried About Programme researcher, broadcasting/film/video in the Last Year: Sometimes true  . Ran Out of Food in the Last Year: Sometimes true  Transportation Needs: No Transportation Needs  . Lack of Transportation (Medical): No  . Lack of Transportation (Non-Medical): No  Physical Activity: Insufficiently Active  . Days of Exercise per Week: 3 days  . Minutes of Exercise per Session: 30 min  Stress: Stress Concern Present  . Feeling of Stress : Rather much  Social Connections: Moderately Integrated  . Frequency of Communication with Friends and Family: More than three times a week  . Frequency of Social Gatherings with Friends and Family: More than three times a week  . Attends Religious Services: More than 4 times per year  . Active Member of Clubs or Organizations: Yes  . Attends Banker Meetings: More than 4 times per year  . Marital Status: Divorced    Tobacco Counseling Counseling given: Not Answered   Clinical Intake:  Pre-visit preparation completed: Yes  Pain : 0-10 Pain Score: 8  Pain Type: Chronic pain Pain Location: Hip Pain Descriptors / Indicators: Aching,Sharp Pain Onset: More than a month ago Pain Frequency: Constant     BMI - recorded: 24.51 Nutritional Status: BMI of 19-24  Normal Nutritional Risks: None Diabetes:  No  How often do you need to have someone help you when you read instructions, pamphlets, or other written materials from your doctor or pharmacy?: 1 - Never  Diabetic? No  Interpreter Needed?: No  Information entered by :: Sayge Brienza, LPN   Activities of Daily Living In your present state of health, do you have any difficulty performing the following activities: 09/04/2020 01/27/2020  Hearing? N N  Vision? N N  Difficulty concentrating or making decisions? Y N  Walking or climbing stairs? Y Y  Dressing or bathing? N N  Doing errands, shopping? N N  Preparing Food and eating ? N -  Using the Toilet? N -  In the past six months, have you accidently leaked urine? Y -  Do you have problems with loss of bowel control? N -  Managing your Medications? N -  Managing your Finances? N -  Housekeeping or managing your Housekeeping? N -  Some recent data might be hidden    Patient Care Team: Bennie Pierini, FNP as PCP - General (Nurse Practitioner) Antoine Poche, MD as PCP - Cardiology (Cardiology) Kerrin Champagne, MD as Consulting Physician (Orthopedic Surgery) Marinus Maw, MD as Consulting Physician (Cardiology) Wyline Mood Dorothe Pea, MD as Consulting Physician (Cardiology) Kathryne Hitch, MD as Consulting Physician (Orthopedic Surgery) Kerrin Champagne, MD as Consulting Physician (Orthopedic Surgery) Danella Maiers, [facility] (Pharmacist)  Indicate any recent Medical Services you may have received from other than [facility] providers in the past year (date may be approximate).     Assessment:   This is a routine wellness examination for [facility].  Hearing/Vision screen  Hearing Screening  125Hz  250Hz  500Hz  1000Hz  2000Hz  3000Hz  4000Hz  6000Hz  8000Hz   Right ear:           Left ear:           Comments: Denies hearing difficulties   Vision Screening Comments: Wears eyeglasses - up to date with annual eye exams at [facility] in [place]   Dietary issues and exercise  activities discussed: Current Exercise Habits: Home exercise routine, Type of exercise: walking, Time (Minutes): 30, Frequency (Times/Week): 3, Weekly Exercise (Minutes/Week): 90, Intensity: Mild, Exercise limited by: orthopedic condition(s);cardiac condition(s)  Goals Addressed            This Visit's Progress   . Exercise 3x per week (30 min per time)   On track    Increase activity level as tolerated. Aim for 30 minutes of light walking 3 times a week. Walk with a partner.   Do chair exercises daily. Refer to handouts.     . Have 3 meals a day   On track    Eat 3 meals daily that consist of proteins, fruits and vegetables      Depression Screen PHQ 2/9 Scores 09/04/2020 06/26/2020 03/28/2020 01/26/2020 01/10/2020 11/10/2019 09/29/2019  PHQ - 2 Score 2 0 0 1 0 0 5  PHQ- 9 Score 8 6 - 7 9 - 15    Fall Risk Fall Risk  09/04/2020 06/26/2020 03/28/2020 01/26/2020 11/10/2019  Falls in the past year? 1 0 0 0 0  Number falls in past yr: 1 - - - -  Comment - - - - -  Injury with Fall? 1 - - - -  Comment - - - - -  Risk Factor Category  - - - - -  Risk for fall due to : History of fall(s);Orthopedic patient;Impaired vision;Medication side effect;Impaired balance/gait - - - -  Risk for fall due to: Comment - - - - -  Follow up Falls prevention discussed;Education provided - - - -    FALL RISK PREVENTION PERTAINING TO THE HOME:  Any stairs in or around the home? Yes  If so, are there any without handrails? No  Home free of loose throw rugs in walkways, pet beds, electrical cords, etc? Yes  Adequate lighting in your home to reduce risk of falls? Yes   ASSISTIVE DEVICES UTILIZED TO PREVENT FALLS:  Life alert? No  Use of a cane, walker or w/c? Yes  Grab bars in the bathroom? No  Shower chair or bench in shower? Yes  Elevated toilet seat or a handicapped toilet? No   TIMED UP AND GO:  Was the test performed? No . Telephonic visit  Cognitive Function: MMSE - Mini Mental State Exam  06/30/2017 08/20/2016  Orientation to time 4 4  Orientation to Place 5 5  Registration 3 3  Attention/ Calculation 0 5  Recall 1 0  Language- name 2 objects 2 2  Language- repeat 1 1  Language- follow 3 step command 3 3  Language- read & follow direction 1 1  Write a sentence 1 1  Copy design 1 1  Total score 22 26     6CIT Screen 09/04/2020 08/01/2019 07/29/2018  What Year? 0 points 0 points 0 points  What month? 0 points 0 points 0 points  What time? 0 points 0 points 0 points  Count back from 20 0 points 0 points 0 points  Months in reverse 4 points - 4 points  Repeat phrase 6 points 4 points 2 points  Total Score  10 - 6    Immunizations Immunization History  Administered Date(s) Administered  . Influenza Split 06/11/2013  . Influenza Whole 12/19/2011  . Influenza,inj,Quad PF,6+ Mos 01/25/2013, 01/05/2014, 02/12/2016, 05/07/2017, 03/16/2018, 01/19/2019, 01/26/2020  . Moderna Sars-Covid-2 Vaccination 10/08/2019, 06/21/2020  . Pneumococcal Conjugate-13 01/05/2014  . Pneumococcal Polysaccharide-23 11/30/2011, 01/31/2012  . Tdap 08/05/2011  . Zoster, Live 01/02/2012    TDAP status: Up to date  Flu Vaccine status: Up to date  Pneumococcal vaccine status: Up to date  Covid-19 vaccine status: Completed vaccines  Qualifies for Shingles Vaccine? Yes   Zostavax completed Yes   Shingrix Completed?: No.    Education has been provided regarding the importance of this vaccine. Patient has been advised to call insurance company to determine out of pocket expense if they have not yet received this vaccine. Advised may also receive vaccine at local pharmacy or Health Dept. Verbalized acceptance and understanding.  Screening Tests Health Maintenance  Topic Date Due  . Zoster Vaccines- Shingrix (1 of 2) Never done  . Pneumococcal Vaccine 51-37 Years old (1 of 2 - PPSV23) Never done  . PAP SMEAR-Modifier  10/15/2019  . INFLUENZA VACCINE  10/29/2020  . COVID-19 Vaccine (3 - Booster for  Moderna series) 11/21/2020  . TETANUS/TDAP  08/04/2021  . COLONOSCOPY (Pts 45-61yrs Insurance coverage will need to be confirmed)  04/26/2022  . Hepatitis C Screening  Completed  . HIV Screening  Completed  . HPV VACCINES  Aged Out  . MAMMOGRAM  Discontinued    Health Maintenance  Health Maintenance Due  Topic Date Due  . Zoster Vaccines- Shingrix (1 of 2) Never done  . Pneumococcal Vaccine 47-40 Years old (1 of 2 - PPSV23) Never done  . PAP SMEAR-Modifier  10/15/2019    Colorectal cancer screening: Type of screening: Colonoscopy. Completed 04/26/2012. Repeat every 10 years  Mammogram status: Ordered 09/04/20. Pt provided with contact info and advised to call to schedule appt.   Bone Density status: Ordered 09/04/20. Pt provided with contact info and advised to call to schedule appt.  Lung Cancer Screening: (Low Dose CT Chest recommended if Age 110-80 years, 30 pack-year currently smoking OR have quit w/in 15years.) does not qualify.   Additional Screening:  Hepatitis C Screening: does qualify; Completed 11/10/2014  Vision Screening: Recommended annual ophthalmology exams for early detection of glaucoma and other disorders of the eye. Is the patient up to date with their annual eye exam?  Yes  Who is the provider or what is the name of the office in which the patient attends annual eye exams? [facility] If pt is not established with a provider, would they like to be referred to a provider to establish care? No .   Dental Screening: Recommended annual dental exams for proper oral hygiene  Community Resource Referral / Chronic Care Management: CRR required this visit?  No   CCM required this visit?  No      Plan:     I have personally reviewed and noted the following in the patient's chart:   . Medical and social history . Use of alcohol, tobacco or illicit drugs  . Current medications and supplements including opioid prescriptions.  . Functional ability and  status . Nutritional status . Physical activity . Advanced directives . List of other physicians . Hospitalizations, surgeries, and ER visits in previous 12 months . Vitals . Screenings to include cognitive, depression, and falls . Referrals and appointments  In addition, I have reviewed and discussed with patient  certain preventive protocols, quality metrics, and best practice recommendations. A written personalized care plan for preventive services as well as general preventive health recommendations were provided to patient.     [place] Constable, LPN   6/0/4540   Nurse Notes: None

## 2020-09-04 NOTE — Patient Instructions (Signed)
[name] , Thank you for taking time to come for your Medicare Wellness Visit. I appreciate your ongoing commitment to your health goals. Please review the following plan we discussed and let me know if I can assist you in the future.   Screening recommendations/referrals: Colonoscopy: Done 04/26/2012 - Repeat in 10 years Mammogram: Done 2016 - advised to repeat annually - ordered repeat today Bone Density: Due Recommended yearly ophthalmology/optometry visit for glaucoma screening and checkup Recommended yearly dental visit for hygiene and checkup  Vaccinations: Influenza vaccine: Done 01/26/2020 - Repeat annually Pneumococcal vaccine: Done 01/31/2012 & 01/05/2014 Tdap vaccine: Done 08/05/2011 - Repeat in 10 years Shingles vaccine: Due  Covid-19: Done 10/08/2019 & 06/21/2020   Advanced directives: Please bring a copy of your health care power of attorney and living will to the office to be added to your chart at your convenience.  Conditions/risks identified: Aim for 30 minutes of exercise or brisk walking each day, drink 6-8 glasses of water and eat lots of fruits and vegetables.  Next appointment: Follow up in one year for your annual wellness visit.   Preventive Care 40-64 Years, Female Preventive care refers to lifestyle choices and visits with your health care provider that can promote health and wellness. What does preventive care include?  A yearly physical exam. This is also called an annual well check.  Dental exams once or twice a year.  Routine eye exams. Ask your health care provider how often you should have your eyes checked.  Personal lifestyle choices, including:  Daily care of your teeth and gums.  Regular physical activity.  Eating a healthy diet.  Avoiding tobacco and drug use.  Limiting alcohol use.  Practicing safe sex.  Taking low-dose aspirin daily starting at age 75.  Taking vitamin and mineral supplements as recommended by your health care  provider. What happens during an annual well check? The services and screenings done by your health care provider during your annual well check will depend on your age, overall health, lifestyle risk factors, and family history of disease. Counseling  Your health care provider may ask you questions about your:  Alcohol use.  Tobacco use.  Drug use.  Emotional well-being.  Home and relationship well-being.  Sexual activity.  Eating habits.  Work and work Statistician.  Method of birth control.  Menstrual cycle.  Pregnancy history. Screening  You may have the following tests or measurements:  Height, weight, and BMI.  Blood pressure.  Lipid and cholesterol levels. These may be checked every 5 years, or more frequently if you are over 76 years old.  Skin check.  Lung cancer screening. You may have this screening every year starting at age 34 if you have a 30-pack-year history of smoking and currently smoke or have quit within the past 15 years.  Fecal occult blood test (FOBT) of the stool. You may have this test every year starting at age 69.  Flexible sigmoidoscopy or colonoscopy. You may have a sigmoidoscopy every 5 years or a colonoscopy every 10 years starting at age 43.  Hepatitis C blood test.  Hepatitis B blood test.  Sexually transmitted disease (STD) testing.  Diabetes screening. This is done by checking your blood sugar (glucose) after you have not eaten for a while (fasting). You may have this done every 1-3 years.  Mammogram. This may be done every 1-2 years. Talk to your health care provider about when you should start having regular mammograms. This may depend on whether you have  a family history of breast cancer.  BRCA-related cancer screening. This may be done if you have a family history of breast, ovarian, tubal, or peritoneal cancers.  Pelvic exam and Pap test. This may be done every 3 years starting at age 28. Starting at age 73, this may be  done every 5 years if you have a Pap test in combination with an HPV test.  Bone density scan. This is done to screen for osteoporosis. You may have this scan if you are at high risk for osteoporosis. Discuss your test results, treatment options, and if necessary, the need for more tests with your health care provider. Vaccines  Your health care provider may recommend certain vaccines, such as:  Influenza vaccine. This is recommended every year.  Tetanus, diphtheria, and acellular pertussis (Tdap, Td) vaccine. You may need a Td booster every 10 years.  Zoster vaccine. You may need this after age 55.  Pneumococcal 13-valent conjugate (PCV13) vaccine. You may need this if you have certain conditions and were not previously vaccinated.  Pneumococcal polysaccharide (PPSV23) vaccine. You may need one or two doses if you smoke cigarettes or if you have certain conditions. Talk to your health care provider about which screenings and vaccines you need and how often you need them. This information is not intended to replace advice given to you by your health care provider. Make sure you discuss any questions you have with your health care provider. Document Released: 04/13/2015 Document Revised: 12/05/2015 Document Reviewed: 01/16/2015 Elsevier Interactive Patient Education  2017 [place] Prevention in the Home Falls can cause injuries. They can happen to people of all ages. There are many things you can do to make your home safe and to help prevent falls. What can I do on the outside of my home?  Regularly fix the edges of walkways and driveways and fix any cracks.  Remove anything that might make you trip as you walk through a door, such as a raised step or threshold.  Trim any bushes or trees on the path to your home.  Use bright outdoor lighting.  Clear any walking paths of anything that might make someone trip, such as rocks or tools.  Regularly check to see if  handrails are loose or broken. Make sure that both sides of any steps have handrails.  Any raised decks and porches should have guardrails on the edges.  Have any leaves, snow, or ice cleared regularly.  Use sand or salt on walking paths during winter.  Clean up any spills in your garage right away. This includes oil or grease spills. What can I do in the bathroom?  Use night lights.  Install grab bars by the toilet and in the tub and shower. Do not use towel bars as grab bars.  Use non-skid mats or decals in the tub or shower.  If you need to sit down in the shower, use a plastic, non-slip stool.  Keep the floor dry. Clean up any water that spills on the floor as soon as it happens.  Remove soap buildup in the tub or shower regularly.  Attach bath mats securely with double-sided non-slip rug tape.  Do not have throw rugs and other things on the floor that can make you trip. What can I do in the bedroom?  Use night lights.  Make sure that you have a light by your bed that is easy to reach.  Do not use any sheets or blankets that  are too big for your bed. They should not hang down onto the floor.  Have a firm chair that has side arms. You can use this for support while you get dressed.  Do not have throw rugs and other things on the floor that can make you trip. What can I do in the kitchen?  Clean up any spills right away.  Avoid walking on wet floors.  Keep items that you use a lot in easy-to-reach places.  If you need to reach something above you, use a strong step stool that has a grab bar.  Keep electrical cords out of the way.  Do not use floor polish or wax that makes floors slippery. If you must use wax, use non-skid floor wax.  Do not have throw rugs and other things on the floor that can make you trip. What can I do with my stairs?  Do not leave any items on the stairs.  Make sure that there are handrails on both sides of the stairs and use them. Fix  handrails that are broken or loose. Make sure that handrails are as long as the stairways.  Check any carpeting to make sure that it is firmly attached to the stairs. Fix any carpet that is loose or worn.  Avoid having throw rugs at the top or bottom of the stairs. If you do have throw rugs, attach them to the floor with carpet tape.  Make sure that you have a light switch at the top of the stairs and the bottom of the stairs. If you do not have them, ask someone to add them for you. What else can I do to help prevent falls?  Wear shoes that:  Do not have high heels.  Have rubber bottoms.  Are comfortable and fit you well.  Are closed at the toe. Do not wear sandals.  If you use a stepladder:  Make sure that it is fully opened. Do not climb a closed stepladder.  Make sure that both sides of the stepladder are locked into place.  Ask someone to hold it for you, if possible.  Clearly mark and make sure that you can see:  Any grab bars or handrails.  First and last steps.  Where the edge of each step is.  Use tools that help you move around (mobility aids) if they are needed. These include:  Canes.  Walkers.  Scooters.  Crutches.  Turn on the lights when you go into a dark area. Replace any light bulbs as soon as they burn out.  Set up your furniture so you have a clear path. Avoid moving your furniture around.  If any of your floors are uneven, fix them.  If there are any pets around you, be aware of where they are.  Review your medicines with your doctor. Some medicines can make you feel dizzy. This can increase your chance of falling. Ask your doctor what other things that you can do to help prevent falls. This information is not intended to replace advice given to you by your health care provider. Make sure you discuss any questions you have with your health care provider. Document Released: 01/11/2009 Document Revised: 08/23/2015 Document Reviewed:  04/21/2014 Elsevier Interactive Patient Education  2017 [employer].

## 2020-09-10 ENCOUNTER — Encounter: Payer: Self-pay | Admitting: Orthopaedic Surgery

## 2020-09-10 ENCOUNTER — Telehealth: Payer: Self-pay

## 2020-09-10 ENCOUNTER — Ambulatory Visit (INDEPENDENT_AMBULATORY_CARE_PROVIDER_SITE_OTHER): Payer: 59 | Admitting: Orthopaedic Surgery

## 2020-09-10 ENCOUNTER — Ambulatory Visit: Payer: Self-pay

## 2020-09-10 DIAGNOSIS — M7062 Trochanteric bursitis, left hip: Secondary | ICD-10-CM | POA: Diagnosis not present

## 2020-09-10 DIAGNOSIS — Z96642 Presence of left artificial hip joint: Secondary | ICD-10-CM | POA: Diagnosis not present

## 2020-09-10 MED ORDER — METHYLPREDNISOLONE ACETATE 40 MG/ML IJ SUSP
40.0000 mg | INTRAMUSCULAR | Status: AC | PRN
  Administered 2020-09-10: 40 mg via INTRA_ARTICULAR

## 2020-09-10 MED ORDER — LIDOCAINE HCL 1 % IJ SOLN
3.0000 mL | INTRAMUSCULAR | Status: AC | PRN
  Administered 2020-09-10: 3 mL

## 2020-09-10 NOTE — Telephone Encounter (Signed)
Patient called regarding her appointment on Wednesday patient wants to know if it is normal for her shoulder to be "locking up" she is requesting a call back to see if the visit will be necessary call back:[phone]

## 2020-09-10 NOTE — Progress Notes (Signed)
Office Visit Note   Patient: [name]           Date of Birth: 06-02-1955           MRN: [id number] Visit Date: 09/10/2020              Requested by: Bennie Pierini, FNP [address] [place],  [place] [address] PCP: Bennie Pierini, FNP   Assessment & Plan: Visit Diagnoses:  1. History of left hip replacement   2. Trochanteric bursitis, left hip     Plan: This seems to be more of a trochanteric bursitis she is dealing with which can be common after joint replacement surgery.  She should still follow-up with her spine specialist but I did feel it was worthwhile trying a steroid injection of her left hip trochanteric area.  The The need to see her back would be in 5 months.  We can just have a standing AP pelvis at that visit.  All questions and concerns were answered and addressed.  Follow-Up Instructions: Return in about 5 months (around 02/10/2021).   Orders:  Orders Placed This Encounter  Procedures   Large Joint Inj   XR HIP UNILAT W OR W/O PELVIS 1V LEFT   No orders of the defined types were placed in this encounter.     Procedures: Large Joint Inj: L greater trochanter on 09/10/2020 10:22 AM Indications: pain and diagnostic evaluation Details: 22 G 1.5 in needle, lateral approach  Arthrogram: No  Medications: 3 mL lidocaine 1 %; 40 mg methylPREDNISolone acetate 40 MG/ML Outcome: tolerated well, no immediate complications Procedure, treatment alternatives, risks and benefits explained, specific risks discussed. Consent was given by the patient. Immediately prior to procedure a time out was called to verify the correct patient, procedure, equipment, support staff and site/side marked as required. Patient was prepped and draped in the usual sterile fashion.      Clinical Data: No additional findings.   Subjective: Chief Complaint  Patient presents with   Left Hip - Follow-up  The patient is now 7 months status post left total hip  arthroplasty surgery.  She also has a history of significant lumbar spine issues and has had spine surgery before.  She tells me they may need to do more.  She comes in today feeling like a knife is stabbing around her left hip.  She says it hurts to lay on that side.  HPI  Review of Systems There are no symptoms of instability.  She is not walking with any assistive device.  There is currently no listed headache, chest pain, shortness of breath, fever, chills, nausea, vomiting  Objective: Vital Signs: There were no vitals taken for this visit.  Physical Exam  Ortho Exam On examination her leg lengths are equal.  She tolerates me easily putting her left operative hip through internal and external rotation.  She has some pain to palpation of the trochanteric area. Specialty Comments:  No specialty comments available.  Imaging: XR HIP UNILAT W OR W/O PELVIS 1V LEFT  Result Date: 09/10/2020 An AP pelvis and lateral left hip shows a well-seated total hip arthroplasty with no evidence of loosening or complicating features.  The alignment is well-maintained.    PMFS History: Patient Active Problem List   Diagnosis Date Noted   Status post total replacement of left hip 01/31/2020   Left ear pain 01/10/2020   Non-recurrent acute serous otitis media of both ears 01/10/2020   Dysuria 01/10/2020   Unilateral  primary osteoarthritis, left hip 12/21/2019   S/P shoulder replacement, right 11/15/2019   Abnormal blood sugar 09/29/2019   Overweight (BMI 25.0-29.9) 04/15/2019   Diverticulosis 04/14/2019   Chronic diastolic CHF (congestive heart failure) (HCC) 04/14/2019   Chest pain 01/17/2019   Unstable angina (HCC) 10/20/2017   Chronic pain 09/23/2016   Pre-diabetes 09/02/2016   Recurrent major depressive disorder, in partial remission (HCC) 07/14/2016   Seizures (HCC) 07/14/2016   GAD (generalized anxiety disorder) 07/14/2016   Insomnia 09/05/2014   Allergic rhinitis 09/05/2014    Cervical spondylosis without myelopathy 12/19/2013    Class: Chronic   Neural foraminal stenosis of cervical spine 12/19/2013   Cervical radiculitis 09/19/2013   Lumbosacral spondylosis without myelopathy 11/16/2012   Postlaminectomy syndrome, lumbar region 11/16/2012   Herpes simplex virus (HSV) infection 10/31/2008   Hyperlipidemia with target LDL less than 100 10/31/2008   Essential hypertension 10/31/2008   GERD 10/31/2008   SPINAL STENOSIS OF LUMBAR REGION 10/31/2008   Myalgia 10/31/2008   Osteoporosis 10/31/2008   SPONDYLOLISTHESIS 10/31/2008   SYNCOPE 10/31/2008   Sleep apnea 10/31/2008   Past Medical History:  Diagnosis Date   Allergy    Anemia    Anginal pain (HCC)    last time    Anxiety    Arthritis    RHEUMATOID   Asthma    Bipolar 1 disorder (HCC)    Blood transfusion without reported diagnosis    Cataracts, bilateral 07/2017   CHF (congestive heart failure) (HCC)    COPD (chronic obstructive pulmonary disease) (HCC)    Coronary artery disease    reported hx of "MI";  Echo 2009 with normal LVF;  Myoview 05/2011: no ischemia   Depression    Diabetes mellitus without complication (HCC)    Dyslipidemia    Dysrhythmia    SVT   Esophageal stricture    Fibromyalgia    GERD (gastroesophageal reflux disease)    H/O hiatal hernia    Head injury, unspecified    Headache    migraines   Herpes simplex infection    History of kidney stones    History of loop recorder    Managed by Dr. Sharrell Ku   Hyperlipidemia    Hypertension    Insomnia    Myocardial infarction [facility])    age 7   Osteoporosis    Pneumonia    hx   Seizures (HCC)    Shortness of breath    Sleep apnea    ? neg   Spinal stenosis of lumbar region    Spondylolisthesis    Status post placement of implantable loop recorder    Supraventricular tachycardia (HCC)    Syncope and collapse    s/p ILR; no arhythmogenic cause identified   UTI (lower urinary tract infection)     Family History   Problem Relation Age of Onset   Heart attack Father    Mental illness Father    Mental illness Mother    Heart attack Brother        stents   Alcohol abuse Brother    Heart disease Brother    Drug abuse Brother    Diabetes Brother    Colon cancer Maternal Aunt    Cirrhosis Brother    Stomach cancer Neg Hx    Esophageal cancer Neg Hx    Rectal cancer Neg Hx     Past Surgical History:  Procedure Laterality Date   BACK SURGERY     BREAST SURGERY  lumpectomy   CARDIAC CATHETERIZATION  10/06/2011   CATARACT EXTRACTION W/PHACO Right 07/31/2017   Procedure: CATARACT EXTRACTION PHACO AND INTRAOCULAR LENS PLACEMENT (IOC);  Surgeon: Fabio Pierce, MD;  Location: [facility];  Service: Ophthalmology;  Laterality: Right;  CDE: 2.33   CATARACT EXTRACTION W/PHACO Left 08/14/2017   Procedure: CATARACT EXTRACTION PHACO AND INTRAOCULAR LENS PLACEMENT (IOC);  Surgeon: Fabio Pierce, MD;  Location: [facility];  Service: Ophthalmology;  Laterality: Left;  CDE: 2.74   CHOLECYSTECTOMY     CYSTOSCOPY     stone   DIAGNOSTIC LAPAROSCOPY     laparoscopic cholecystectomy   DOPPLER ECHOCARDIOGRAPHY  2009   head up tilt table testing  06/15/2007   [name]   HEMORRHOID SURGERY     insertion of implatable loop recorder  08/11/2007   [name]   POSTERIOR CERVICAL FUSION/FORAMINOTOMY N/A 12/19/2013   Procedure: RIGHT C3-4.C4-5 AND C5-6 FORAMINOTOMIES;  Surgeon: Kerrin Champagne, MD;  Location: [facility] OR;  Service: Orthopedics;  Laterality: N/A;   TOTAL HIP ARTHROPLASTY Left 01/31/2020   Procedure: LEFT TOTAL HIP ARTHROPLASTY ANTERIOR APPROACH;  Surgeon: Kathryne Hitch, MD;  Location: [facility] OR;  Service: Orthopedics;  Laterality: Left;   TOTAL SHOULDER ARTHROPLASTY Right 11/15/2019   Procedure: RIGHT TOTAL SHOULDER ARTHROPLASTY;  Surgeon: Cammy Copa, MD;  Location: [facility];  Service: Orthopedics;  Laterality: Right;   TUBAL LIGATION     UPPER GASTROINTESTINAL ENDOSCOPY     Social History    Occupational History   Occupation: Disability    Comment: 15 years  Tobacco Use   Smoking status: Never   Smokeless tobacco: Never  Vaping Use   Vaping Use: Never used  Substance and Sexual Activity   Alcohol use: No    Alcohol/week: 0.0 standard drinks   Drug use: No   Sexual activity: Not Currently

## 2020-09-10 NOTE — Progress Notes (Signed)
Cardiology Office Note  Date: 09/11/2020   ID: [name], DOB May 23, 1955, MRN [id number]  PCP:  Erica Pierini, FNP  Cardiologist:  Erica Rich, MD Electrophysiologist:  None   Chief Complaint: 6 month follow up / Needs surgical clearance  History of Present Illness: [name] is a 65 y.o. female with a history of Chronic diastolic HF, HTN, USAP, OSA, GERD, HLD, syncopal episodes, autonomic dysfunction..  Last seen by Dr [name] for history for autonomic dysfunction. She was followed by Dr [name] with prior loop recorder without clear arrhythmias. Syncopal episodes were thought to be neurological mediate secondary to vasodepression. Has previously been on florinef and midodrine. Previously referred to autonomic specialist at [facility]. Her florinef had been reduced due to elevated BP's to 0.1 mg po and subsequently to QOD secondary to continued elevated BP's. She was continuing Coreg 9.375 mg po bid  Patient has pending surgery for hiatal hernia repair scheduled for July 2022 [facility] Dr. Andrey [name]. Office fax number [phone]. Attention [name]   She is here today for preop clearance for pending hernia surgery.  She states she feels the best she has felt in quite some time recently.  She denies any anginal or exertional symptoms, orthostatic symptoms, SOB or DOE.  Denies any palpitations or arrhythmias.  No CVA or TIA-like symptoms, PND or orthopnea.  No bleeding issues.  Denies any claudication-like symptoms, DVT or PE-like symptoms.  She states her hiatal hernia is so bad this was causing what she thought were chest pains.  She recently had an EGD on 07/25/2020 with evidence of tortuous esophagus with 8 to 10 cm hiatal hernia in both axial and transverse dimension.  There was no evidence of Barrett's esophagus or esophagitis or stricture in the entire esophagus.  Multiple erosions with stigmata of recent bleeding were found in the gastric body.   "[name]" erosions from hiatal hernia.  She was referred to a surgeon to discuss hernia repair/fundoplication.     Past Medical History:  Diagnosis Date   Allergy    Anemia    Anginal pain (HCC)    last time    Anxiety    Arthritis    RHEUMATOID   Asthma    Bipolar 1 disorder (HCC)    Blood transfusion without reported diagnosis    Cataracts, bilateral 07/2017   CHF (congestive heart failure) (HCC)    COPD (chronic obstructive pulmonary disease) (HCC)    Coronary artery disease    reported hx of "MI";  Echo 2009 with normal LVF;  Myoview 05/2011: no ischemia   Depression    Diabetes mellitus without complication (HCC)    Dyslipidemia    Dysrhythmia    SVT   Esophageal stricture    Fibromyalgia    GERD (gastroesophageal reflux disease)    H/O hiatal hernia    Head injury, unspecified    Headache    migraines   Herpes simplex infection    History of kidney stones    History of loop recorder    Managed by Dr. Sharrell [name]   Hyperlipidemia    Hypertension    Insomnia    Myocardial infarction [facility])    age 37   Osteoporosis    Pneumonia    hx   Seizures (HCC)    Shortness of breath    Sleep apnea    ? neg   Spinal stenosis of lumbar region    Spondylolisthesis    Status post placement of implantable  loop recorder    Supraventricular tachycardia (HCC)    Syncope and collapse    s/p ILR; no arhythmogenic cause identified   UTI (lower urinary tract infection)     Past Surgical History:  Procedure Laterality Date   BACK SURGERY     BREAST SURGERY     lumpectomy   CARDIAC CATHETERIZATION  10/06/2011   CATARACT EXTRACTION W/PHACO Right 07/31/2017   Procedure: CATARACT EXTRACTION PHACO AND INTRAOCULAR LENS PLACEMENT (IOC);  Surgeon: Erica Pierce, MD;  Location: [facility];  Service: Ophthalmology;  Laterality: Right;  CDE: 2.33   CATARACT EXTRACTION W/PHACO Left 08/14/2017   Procedure: CATARACT EXTRACTION PHACO AND INTRAOCULAR LENS PLACEMENT (IOC);  Surgeon: Erica Pierce, MD;  Location: [facility];  Service: Ophthalmology;  Laterality: Left;  CDE: 2.74   CHOLECYSTECTOMY     CYSTOSCOPY     stone   DIAGNOSTIC LAPAROSCOPY     laparoscopic cholecystectomy   DOPPLER ECHOCARDIOGRAPHY  2009   head up tilt table testing  06/15/2007   [name]   HEMORRHOID SURGERY     insertion of implatable loop recorder  08/11/2007   [name]   POSTERIOR CERVICAL FUSION/FORAMINOTOMY N/A 12/19/2013   Procedure: RIGHT C3-4.C4-5 AND C5-6 FORAMINOTOMIES;  Surgeon: Erica Champagne, MD;  Location: [facility] OR;  Service: Orthopedics;  Laterality: N/A;   TOTAL HIP ARTHROPLASTY Left 01/31/2020   Procedure: LEFT TOTAL HIP ARTHROPLASTY ANTERIOR APPROACH;  Surgeon: Erica Hitch, MD;  Location: [facility] OR;  Service: Orthopedics;  Laterality: Left;   TOTAL SHOULDER ARTHROPLASTY Right 11/15/2019   Procedure: RIGHT TOTAL SHOULDER ARTHROPLASTY;  Surgeon: Erica Copa, MD;  Location: [facility];  Service: Orthopedics;  Laterality: Right;   TUBAL LIGATION     UPPER GASTROINTESTINAL ENDOSCOPY      Current Outpatient Medications  Medication Sig Dispense Refill   acyclovir ointment (ZOVIRAX) 5 % APPLY TOPICALLY EVERY 3 HOURS. 30 g 0   albuterol (PROVENTIL) (2.5 MG/3ML) 0.083% nebulizer solution Take 3 mLs (2.5 mg total) by nebulization every 4 (four) hours as needed for wheezing or shortness of breath. 180 mL 0   albuterol (VENTOLIN HFA) 108 (90 Base) MCG/ACT inhaler INHALE 2 PUFFS EVERY 6 HOURS AS NEEDED FOR SHORTNESS OF BREATH AND WHEEZING. 8.5 g 2   Alcohol Swabs (GLOBAL ALCOHOL PREP EASE) 70 % PADS Test BS daily R73.03 100 each 3   busPIRone (BUSPAR) 10 MG tablet Take 1 tablet (10 mg total) by mouth 2 (two) times daily as needed. 60 tablet 2   butalbital-acetaminophen-caffeine (FIORICET) 50-325-40 MG tablet TAKE 1 TABLET BY MOUTH EVERY 6 HOURS AS NEEDED FOR HEADACHE. 20 tablet 0   carvedilol (COREG) 6.25 MG tablet TAKE (1) TABLET TWICE DAILY. 60 tablet 1   celecoxib (CELEBREX) 200 MG  capsule TAKE 1 CAPSULE BY MOUTH TWICE DAILY BETWEEN MEALS AS NEEDED. 60 capsule 0   dexlansoprazole (DEXILANT) 60 MG capsule Take 1 capsule (60 mg total) by mouth daily. 90 capsule 1   diclofenac Sodium (VOLTAREN) 1 % GEL Apply 4 g topically 4 (four) times daily. (Patient taking differently: Apply 4 g topically 4 (four) times daily as needed (pain).) 500 g 3   DULoxetine (CYMBALTA) 60 MG capsule Take 1 capsule (60 mg total) by mouth at bedtime. 90 capsule 1   empagliflozin (JARDIANCE) 25 MG TABS tablet Take 1 tablet (25 mg total) by mouth daily before breakfast. 90 tablet 3   escitalopram (LEXAPRO) 20 MG tablet Take 1 tablet (20 mg total) by mouth daily. 90 tablet  1   estradiol (ESTRACE) 0.1 MG/GM vaginal cream Place 1 Applicatorful vaginally at bedtime. 42.5 g 0   FIBER PO Take 2 tablets by mouth in the morning, at noon, and at bedtime.     fludrocortisone (FLORINEF) 0.1 MG tablet TAKE 1 TABLET EVERY OTHER DAY. 15 tablet 1   fluticasone (FLONASE) 50 MCG/ACT nasal spray USE 1 SPRAY IN EACH NOSTRIL ONCE DAILY. 16 g 5   fluticasone furoate-vilanterol (BREO ELLIPTA) 200-25 MCG/INH AEPB Inhale 1 puff into the lungs daily. 60 each 5   furosemide (LASIX) 40 MG tablet Take 1 tablet (40 mg total) by mouth daily. 90 tablet 1   glucose blood (ONETOUCH VERIO) test strip Test BS daily R73.03 100 strip 3   hydrALAZINE (APRESOLINE) 25 MG tablet TAKE 1 TABLET BY MOUTH 3 TIMES DAILY AS NEEDED (SEVERE HYPERTENSION/SYSTOLIC NUMBER 170 OR GREATER). 90 tablet 1   HYDROcodone-acetaminophen (NORCO) 7.5-325 MG tablet Take 1 tablet by mouth in the morning, at noon, and at bedtime. 90 tablet 0   ipratropium (ATROVENT) 0.02 % nebulizer solution USE 1 VIAL ( ) IN NEBULIZER EVERY 6 HOURS AS NEEDED FOR WHEEZING OR SHORTNESS OF BREATH. 150 mL 2   isosorbide mononitrate (IMDUR) 30 MG 24 hr tablet TAKE 1 TABLET ONCE DAILY. 30 tablet 0   lamoTRIgine (LAMICTAL) 150 MG tablet Take 1 tablet (150 mg total) by mouth daily. 90 tablet 1    Lancets (ONETOUCH DELICA PLUS LANCET33G) MISC USE TO CHECK SUGAR DAILY. 300 each 5   levETIRAcetam (KEPPRA) 500 MG tablet TAKE (1) TABLET TWICE DAILY. 180 tablet 1   levocetirizine (XYZAL) 5 MG tablet TAKE 1 TABLET BY MOUTH EVERY MORNING. 30 tablet 3   lidocaine (XYLOCAINE) 5 % ointment APPLY TO AFFECTED AREA 3 TIMES ADAY AS NEEDED FOR MILD OR MODERATE PAIN. 35.44 g 0   methocarbamol (ROBAXIN) 500 MG tablet TAKE (1) TABLET BY MOUTH EVERY SIX HOURS AS NEEDED 40 tablet 0   mupirocin ointment (BACTROBAN) 2 % APPLY TO AFFECTED AREA TWICE DAILY. 22 g 2   nitroGLYCERIN (NITROSTAT) 0.4 MG SL tablet PLACE ONE (1) TABLET UNDER TONGUE EVERY 5 MINUTES UP TO (3) DOSES AS NEEDED FOR CHEST PAIN. 25 tablet 1   ondansetron (ZOFRAN) 4 MG tablet TAKE 1 TABLET BY MOUTH EVERY 8 HOURS AS NEEDED FOR NAUSEA AND VOMITING. 20 tablet 0   rosuvastatin (CRESTOR) 10 MG tablet Take 1 tablet (10 mg total) by mouth daily. 90 tablet 1   sucralfate (CARAFATE) 1 g tablet Take 1 tablet (1 g total) by mouth 2 (two) times daily. 90 tablet 1   traZODone (DESYREL) 150 MG tablet Take 1 tablet (150 mg total) by mouth at bedtime. 90 tablet 1   valACYclovir (VALTREX) 1000 MG tablet TAKE 1 TABLET 3 TIMES A DAY. TO BE TAKEN ONLY WHEN SHE HAS OUT BREAK. 21 tablet 0   valACYclovir (VALTREX) 500 MG tablet Take 1 tablet (500 mg total) by mouth 2 (two) times daily. 180 tablet 1   No current facility-administered medications for this visit.   Allergies:  Codeine, Morphine and related, Ambien [zolpidem tartrate], Clonidine derivatives, Metformin and related, Lyrica [pregabalin], and Neurontin [gabapentin]   Social History: The patient  reports that she has never smoked. She has never used smokeless tobacco. She reports that she does not drink alcohol and does not use drugs.   Family History: The patient's family history includes Alcohol abuse in her brother; Cirrhosis in her brother; Colon cancer in her maternal aunt; Diabetes in her brother;  Drug abuse in her brother; Heart attack in her brother and father; Heart disease in her brother; Mental illness in her father and mother.   ROS:  Please see the history of present illness. Otherwise, complete review of systems is positive for none.  All other systems are reviewed and negative.   Physical Exam: VS:  BP 114/68   Pulse 78   Ht  (1.575 m)   Wt 134 lb 3.2 oz (60.9 kg)   SpO2 94%   BMI 24.55 kg/m , BMI Body mass index is 24.55 kg/m.  Wt Readings from Last 3 Encounters:  09/11/20 134 lb 3.2 oz (60.9 kg)  09/04/20 134 lb (60.8 kg)  07/25/20 134 lb (60.8 kg)    General: Patient appears comfortable at rest. Neck: Supple, no elevated JVP or carotid bruits, no thyromegaly. Lungs: Clear to auscultation, nonlabored breathing at rest. Cardiac: Regular rate and rhythm, no S3 or significant systolic murmur, no pericardial rub. Extremities: No pitting edema, distal pulses 2+. Skin: Warm and dry. Musculoskeletal: No kyphosis. Neuropsychiatric: Alert and oriented x3, affect grossly appropriate.  ECG: September 11, 2020 EKG normal sinus rhythm with a rate of 72, minimal voltage criteria for LVH.  May be normal variant.  Recent Labwork: 06/26/2020: ALT 27; AST 20; BUN 14; Creatinine, Ser 0.72; Hemoglobin 14.8; Platelets 209; Potassium 4.1; Sodium 144     Component Value Date/Time   CHOL 162 06/26/2020 1248   TRIG 124 06/26/2020 1248   TRIG 233 (H) 07/24/2014 1040   HDL 52 06/26/2020 1248   HDL 47 07/24/2014 1040   CHOLHDL 3.1 06/26/2020 1248   CHOLHDL 4.2 CALC 04/22/2007 1012   VLDL 20 04/22/2007 1012   LDLCALC 88 06/26/2020 1248   LDLDIRECT 142.1 04/22/2007 1012    Other Studies Reviewed Today:  Echocardiogram 01/18/2019  1. Left ventricular ejection fraction, by visual estimation, is 60 to 65%. The left ventricle has normal function. There is mildly increased left ventricular hypertrophy. 2. Left ventricular diastolic Doppler parameters are consistent with impaired  relaxation pattern of LV diastolic filling. 3. Global right ventricle has normal systolic function.The right ventricular size is normal. No increase in right ventricular wall thickness. 4. Left atrial size was normal. 5. Right atrial size was normal. 6. The mitral valve is normal in structure. No evidence of mitral valve regurgitation. No evidence of mitral stenosis. 7. The tricuspid valve is normal in structure. Tricuspid valve regurgitation was not visualized by color flow Doppler. 8. The aortic valve is tricuspid Aortic valve regurgitation is mild to moderate by color flow Doppler. Structurally normal aortic valve, with no evidence of sclerosis or stenosis. 9. The pulmonic valve was not well visualized. Pulmonic valve regurgitation is not visualized by color flow Doppler. 10. Normal pulmonary artery systolic pressure.    05/2011 Lexiscan MPI No ischemia     Jan 2009 Echo LEFT VENTRICLE: - Left ventricular size was normal. - Overall left ventricular systolic function was normal. - There were no left ventricular regional wall motion abnormalities. - Left ventricular wall thickness was normal.  AORTIC VALVE: - The aortic valve was trileaflet. - Aortic valve thickness was normal.  AORTA: - The aortic root was normal in size. - The aortic arch was normal.  MITRAL VALVE: - Mitral valve structure was normal.  Doppler interpretation(s): - There was trivial mitral valvular regurgitation.  LEFT ATRIUM: - Left atrial size was normal.  PULMONARY VEINS: - The pulmonary veins were grossly normal.  RIGHT VENTRICLE: - Right ventricular size was  normal. - Right ventricular systolic function was normal. - Right ventricular wall thickness was normal.  PULMONIC VALVE: - The structure of the pulmonic valve appeared to be normal.  TRICUSPID VALVE: - The tricuspid valve structure was normal.  Doppler interpretation(s): - There was no significant tricuspid valvular  regurgitation.  PULMONARY ARTERY: - The pulmonary artery was normal size.  RIGHT ATRIUM: - Right atrial size was normal.  SYSTEMIC VEINS: - The inferior vena cava was normal.  PERICARDIUM: - There was no pericardial effusion.  ---------------------------------------------------------------  SUMMARY - Overall left ventricular systolic function was normal. There were no left ventricular regional wall motion abnormalities. - The pulmonary veins were grossly normal.     09/2011 Cath Hemodynamic Findings:   Central aortic pressure: 108/58   Left ventricular pressure: 107/10/14   Angiographic Findings:   Left main: No obstructive disease noted.   Left Anterior Descending Artery: Moderate to large sized vessel that courses to the apex. There is a moderate sized diagonal branch. No obstructive disease noted.   Circumflex Artery: Large, dominant artery with moderate sized first obtuse marginal branch and left sided posterolateral branch with no disease noted.   Right Coronary Artery: Small, non-dominant vessel with no disease noted.   Left Ventricular Angiogram:LVEF=65%.   Impression:   1. No angiographic evidence of CAD   2. Normal LV systolic function   3. Non-cardiac chest pain   Recommendations: No further ischemic workup.   Complications: None. The patient tolerated the procedure well.     11/2014 echo Study Conclusions  - Left ventricle: The cavity size was normal. Systolic function was   normal. The estimated ejection fraction was in the range of 55%   to 60%. Wall motion was normal; there were no regional wall   motion abnormalities. There was an increased relative   contribution of atrial contraction to ventricular filling.   Doppler parameters are consistent with abnormal left ventricular   relaxation (grade 1 diastolic dysfunction). - Mitral valve: There was trivial regurgitation. - Tricuspid valve: There was trivial regurgitation. - Pulmonic valve: There was  trivial regurgitation.      09/2017 nuclear stress There was no ST segment deviation noted during stress. Nonspecific T wave flattening in aVL and V2 seen throughout study. Defect 1: There is a medium defect of mild severity present in the mid inferior, apical inferior and apical lateral location. This appears to be due to soft tissue attenuation given normal regional wall motion. No ischemic territories. This is a low risk study. Nuclear stress EF: 65%.   Assessment and Plan:  1. Preoperative clearance   2. Syncope, unspecified syncope type   3. Essential hypertension   4. Autonomic dysfunction   5. Hyperlipidemia with target LDL less than 100    1. Preoperative clearance Patient has a pending surgery for large hiatal hernia repair with [facility] Dr. Andrey [name].  Her revised cardiac risk index = 2 placing her at a perioperative risk of major cardiac event of 6.6%.  Her Duke activity score index is 24.2 giving her a functional capacity and METS of 5.7.  She is cleared to undergo hernia repair under general anesthesia from a cardiac standpoint.  2. Syncope, unspecified syncope type Denies any further syncopal episodes.  Continue Florinef 0.1 mg p.o. every other day.  3. Essential hypertension Blood pressure is well controlled today at 114/68.  Continue hydralazine 25 mg p.o. 3 times daily as needed for severe hypertension systolic number of 170 or greater.  Continue  carvedilol 6.25 mg p.o. twice daily.  Lasix 40 mg p.o. daily.  4. Autonomic dysfunction No near syncopal or syncopal episodes since starting Florinef.  Continue Florinef 0.1 mg p.o. every other day.  5.  Hyperlipidemia. Continue continue Crestor 10 mg daily.   Medication Adjustments/Labs and Tests Ordered: Current medicines are reviewed at length with the patient today.  Concerns regarding medicines are outlined above.   Disposition: Follow-up with Dr. Wyline [name] or APP 6 months  Signed, Rennis Harding, NP 09/11/2020 3:13 PM    [facility] [address] [place], [place], [place] [address] Phone: [phone]; Fax: [phone]

## 2020-09-10 NOTE — Telephone Encounter (Signed)
IC advised per discussion with Franky Macho best for her to keep appt if she is having locking in the shoulder

## 2020-09-11 ENCOUNTER — Encounter: Payer: Self-pay | Admitting: Family Medicine

## 2020-09-11 ENCOUNTER — Ambulatory Visit (INDEPENDENT_AMBULATORY_CARE_PROVIDER_SITE_OTHER): Payer: 59 | Admitting: Family Medicine

## 2020-09-11 ENCOUNTER — Ambulatory Visit
Admission: RE | Admit: 2020-09-11 | Discharge: 2020-09-11 | Disposition: A | Discharge status: Home / Self Care | Payer: 59 | Source: Ambulatory Visit | Attending: General Surgery | Admitting: General Surgery

## 2020-09-11 VITALS — BP 114/68 | HR 78 | Ht 62.0 in | Wt 134.2 lb

## 2020-09-11 DIAGNOSIS — K219 Gastro-esophageal reflux disease without esophagitis: Secondary | ICD-10-CM

## 2020-09-11 DIAGNOSIS — G909 Disorder of the autonomic nervous system, unspecified: Secondary | ICD-10-CM | POA: Diagnosis not present

## 2020-09-11 DIAGNOSIS — R55 Syncope and collapse: Secondary | ICD-10-CM

## 2020-09-11 DIAGNOSIS — I10 Essential (primary) hypertension: Secondary | ICD-10-CM

## 2020-09-11 DIAGNOSIS — E785 Hyperlipidemia, unspecified: Secondary | ICD-10-CM

## 2020-09-11 DIAGNOSIS — K449 Diaphragmatic hernia without obstruction or gangrene: Secondary | ICD-10-CM

## 2020-09-11 DIAGNOSIS — Z01818 Encounter for other preprocedural examination: Secondary | ICD-10-CM

## 2020-09-11 NOTE — Patient Instructions (Addendum)

## 2020-09-12 ENCOUNTER — Encounter: Payer: Self-pay | Admitting: Nurse Practitioner

## 2020-09-12 ENCOUNTER — Other Ambulatory Visit: Payer: Self-pay

## 2020-09-12 ENCOUNTER — Ambulatory Visit: Payer: 59 | Admitting: Orthopedic Surgery

## 2020-09-12 ENCOUNTER — Other Ambulatory Visit (HOSPITAL_COMMUNITY)
Admission: RE | Admit: 2020-09-12 | Discharge: 2020-09-12 | Disposition: A | Discharge status: Home / Self Care | Payer: 59 | Source: Ambulatory Visit | Attending: Nurse Practitioner | Admitting: Nurse Practitioner

## 2020-09-12 ENCOUNTER — Ambulatory Visit (INDEPENDENT_AMBULATORY_CARE_PROVIDER_SITE_OTHER): Payer: 59 | Admitting: Nurse Practitioner

## 2020-09-12 VITALS — BP 116/80 | HR 77 | Temp 98.2°F | Resp 20 | Ht 62.0 in | Wt 133.0 lb

## 2020-09-12 DIAGNOSIS — I5032 Chronic diastolic (congestive) heart failure: Secondary | ICD-10-CM

## 2020-09-12 DIAGNOSIS — K5790 Diverticulosis of intestine, part unspecified, without perforation or abscess without bleeding: Secondary | ICD-10-CM

## 2020-09-12 DIAGNOSIS — I10 Essential (primary) hypertension: Secondary | ICD-10-CM | POA: Diagnosis not present

## 2020-09-12 DIAGNOSIS — Z0000 Encounter for general adult medical examination without abnormal findings: Secondary | ICD-10-CM

## 2020-09-12 DIAGNOSIS — R55 Syncope and collapse: Secondary | ICD-10-CM

## 2020-09-12 DIAGNOSIS — R569 Unspecified convulsions: Secondary | ICD-10-CM

## 2020-09-12 DIAGNOSIS — F411 Generalized anxiety disorder: Secondary | ICD-10-CM

## 2020-09-12 DIAGNOSIS — Z0001 Encounter for general adult medical examination with abnormal findings: Secondary | ICD-10-CM | POA: Diagnosis not present

## 2020-09-12 DIAGNOSIS — E785 Hyperlipidemia, unspecified: Secondary | ICD-10-CM

## 2020-09-12 DIAGNOSIS — K2100 Gastro-esophageal reflux disease with esophagitis, without bleeding: Secondary | ICD-10-CM

## 2020-09-12 DIAGNOSIS — I200 Unstable angina: Secondary | ICD-10-CM | POA: Diagnosis not present

## 2020-09-12 DIAGNOSIS — F5101 Primary insomnia: Secondary | ICD-10-CM

## 2020-09-12 DIAGNOSIS — R7303 Prediabetes: Secondary | ICD-10-CM

## 2020-09-12 DIAGNOSIS — G8929 Other chronic pain: Secondary | ICD-10-CM

## 2020-09-12 DIAGNOSIS — R7309 Other abnormal glucose: Secondary | ICD-10-CM

## 2020-09-12 DIAGNOSIS — B009 Herpesviral infection, unspecified: Secondary | ICD-10-CM

## 2020-09-12 DIAGNOSIS — G4733 Obstructive sleep apnea (adult) (pediatric): Secondary | ICD-10-CM

## 2020-09-12 LAB — URINALYSIS, COMPLETE
Bilirubin, UA: NEGATIVE
Ketones, UA: NEGATIVE
Nitrite, UA: NEGATIVE
Protein,UA: NEGATIVE
Specific Gravity, UA: 1.02 (ref 1.005–1.030)
Urobilinogen, Ur: 0.2 mg/dL (ref 0.2–1.0)
pH, UA: 6.5 (ref 5.0–7.5)

## 2020-09-12 LAB — MICROSCOPIC EXAMINATION

## 2020-09-12 NOTE — Progress Notes (Signed)
Subjective:    Patient ID: [name], female    DOB: 04/28/1955, 65 y.o.   MRN: [id number]   Chief Complaint: Annual Exam    HPI: Patient come sin today for annual physical exam and PAP. She has multiple medical problems that are being managed by multi[le providers. She has been doing well lately. Her last PAP was in 2017 and it was not normal. She denies any bleeding but her vagina is really dry feeling. She has not had any chest pain or sob  as of late. Still has occasional headache. No recent seizure activity or syncope. She says she is dong quite well since her left hip sugery. She is having surgery on hiatal hernia next month. She has pain management appointment on 09/28/20.  1. Chronic diastolic CHF (congestive heart failure) ([place]) 2. Essential hypertension 3. Unstable angina ([place]) 4. Obstructive sleep apnea syndrome 5. Diverticulosis 6. Gastroesophageal reflux disease with esophagitis without hemorrhage 7. Abnormal blood sugar 8. Other chronic pain 9. GAD (generalized anxiety disorder) 10. Herpes simplex virus (HSV) infection 11. Hyperlipidemia with target LDL less than 100 12. Primary insomnia 13. Pre-diabetes 14. SYNCOPE 15. Seizures [facility])     Outpatient Encounter Medications as of 09/12/2020  Medication Sig   acyclovir ointment (ZOVIRAX) 5 % APPLY TOPICALLY EVERY 3 HOURS.   albuterol (PROVENTIL) (2.5 MG/3ML) 0.083% nebulizer solution Take 3 mLs (2.5 mg total) by nebulization every 4 (four) hours as needed for wheezing or shortness of breath.   albuterol (VENTOLIN HFA) 108 (90 Base) MCG/ACT inhaler INHALE 2 PUFFS EVERY 6 HOURS AS NEEDED FOR SHORTNESS OF BREATH AND WHEEZING.   Alcohol Swabs (GLOBAL ALCOHOL PREP EASE) 70 % PADS Test BS daily R73.03   busPIRone (BUSPAR) 10 MG tablet Take 1 tablet (10 mg total) by mouth 2 (two) times daily as needed.   butalbital-acetaminophen-caffeine (FIORICET) 50-325-40 MG tablet TAKE 1 TABLET BY MOUTH EVERY 6 HOURS AS NEEDED FOR  HEADACHE.   carvedilol (COREG) 6.25 MG tablet TAKE (1) TABLET TWICE DAILY.   celecoxib (CELEBREX) 200 MG capsule TAKE 1 CAPSULE BY MOUTH TWICE DAILY BETWEEN MEALS AS NEEDED.   dexlansoprazole (DEXILANT) 60 MG capsule Take 1 capsule (60 mg total) by mouth daily.   diclofenac Sodium (VOLTAREN) 1 % GEL Apply 4 g topically 4 (four) times daily. (Patient taking differently: Apply 4 g topically 4 (four) times daily as needed (pain).)   DULoxetine (CYMBALTA) 60 MG capsule Take 1 capsule (60 mg total) by mouth at bedtime.   empagliflozin (JARDIANCE) 25 MG TABS tablet Take 1 tablet (25 mg total) by mouth daily before breakfast.   escitalopram (LEXAPRO) 20 MG tablet Take 1 tablet (20 mg total) by mouth daily.   estradiol (ESTRACE) 0.1 MG/GM vaginal cream Place 1 Applicatorful vaginally at bedtime.   FIBER PO Take 2 tablets by mouth in the morning, at noon, and at bedtime.   fludrocortisone (FLORINEF) 0.1 MG tablet TAKE 1 TABLET EVERY OTHER DAY.   fluticasone (FLONASE) 50 MCG/ACT nasal spray USE 1 SPRAY IN EACH NOSTRIL ONCE DAILY.   fluticasone furoate-vilanterol (BREO ELLIPTA) 200-25 MCG/INH AEPB Inhale 1 puff into the lungs daily.   furosemide (LASIX) 40 MG tablet Take 1 tablet (40 mg total) by mouth daily.   glucose blood (ONETOUCH VERIO) test strip Test BS daily R73.03   hydrALAZINE (APRESOLINE) 25 MG tablet TAKE 1 TABLET BY MOUTH 3 TIMES DAILY AS NEEDED (SEVERE HYPERTENSION/SYSTOLIC NUMBER 811 OR GREATER).   HYDROcodone-acetaminophen (NORCO) 7.5-325 MG tablet Take 1 tablet  by mouth in the morning, at noon, and at bedtime.   ipratropium (ATROVENT) 0.02 % nebulizer solution USE 1 VIAL (3ML) IN NEBULIZER EVERY 6 HOURS AS NEEDED FOR WHEEZING OR SHORTNESS OF BREATH.   isosorbide mononitrate (IMDUR) 30 MG 24 hr tablet TAKE 1 TABLET ONCE DAILY.   lamoTRIgine (LAMICTAL) 150 MG tablet Take 1 tablet (150 mg total) by mouth daily.   Lancets (ONETOUCH DELICA PLUS [id number]) MISC USE TO CHECK SUGAR DAILY.    levETIRAcetam (KEPPRA) 500 MG tablet TAKE (1) TABLET TWICE DAILY.   levocetirizine (XYZAL) 5 MG tablet TAKE 1 TABLET BY MOUTH EVERY MORNING.   lidocaine (XYLOCAINE) 5 % ointment APPLY TO AFFECTED AREA 3 TIMES ADAY AS NEEDED FOR MILD OR MODERATE PAIN.   methocarbamol (ROBAXIN) 500 MG tablet TAKE (1) TABLET BY MOUTH EVERY SIX HOURS AS NEEDED   mupirocin ointment (BACTROBAN) 2 % APPLY TO AFFECTED AREA TWICE DAILY.   nitroGLYCERIN (NITROSTAT) 0.4 MG SL tablet PLACE ONE (1) TABLET UNDER TONGUE EVERY 5 MINUTES UP TO (3) DOSES AS NEEDED FOR CHEST PAIN.   ondansetron (ZOFRAN) 4 MG tablet TAKE 1 TABLET BY MOUTH EVERY 8 HOURS AS NEEDED FOR NAUSEA AND VOMITING.   rosuvastatin (CRESTOR) 10 MG tablet Take 1 tablet (10 mg total) by mouth daily.   sucralfate (CARAFATE) 1 g tablet Take 1 tablet (1 g total) by mouth 2 (two) times daily.   traZODone (DESYREL) 150 MG tablet Take 1 tablet (150 mg total) by mouth at bedtime.   valACYclovir (VALTREX) 1000 MG tablet TAKE 1 TABLET 3 TIMES A DAY. TO BE TAKEN ONLY WHEN SHE HAS OUT BREAK.   valACYclovir (VALTREX) 500 MG tablet Take 1 tablet (500 mg total) by mouth 2 (two) times daily.   No facility-administered encounter medications on file as of 09/12/2020.    Past Surgical History:  Procedure Laterality Date   BACK SURGERY     BREAST SURGERY     lumpectomy   CARDIAC CATHETERIZATION  10/06/2011   CATARACT EXTRACTION W/PHACO Right 07/31/2017   Procedure: CATARACT EXTRACTION PHACO AND INTRAOCULAR LENS PLACEMENT (IOC);  Surgeon: [name], MD;  Location: [facility];  Service: Ophthalmology;  Laterality: Right;  CDE: 2.33   CATARACT EXTRACTION W/PHACO Left 08/14/2017   Procedure: CATARACT EXTRACTION PHACO AND INTRAOCULAR LENS PLACEMENT (IOC);  Surgeon: [name], MD;  Location: [facility];  Service: Ophthalmology;  Laterality: Left;  CDE: 2.74   CHOLECYSTECTOMY     CYSTOSCOPY     stone   DIAGNOSTIC LAPAROSCOPY     laparoscopic cholecystectomy   DOPPLER  ECHOCARDIOGRAPHY  2009   head up tilt table testing  06/15/2007   [name]   HEMORRHOID SURGERY     insertion of implatable loop recorder  08/11/2007   [name]   POSTERIOR CERVICAL FUSION/FORAMINOTOMY N/A 12/19/2013   Procedure: RIGHT C3-4.C4-5 AND C5-6 FORAMINOTOMIES;  Surgeon: [name], MD;  Location: [place];  Service: Orthopedics;  Laterality: N/A;   TOTAL HIP ARTHROPLASTY Left 01/31/2020   Procedure: LEFT TOTAL HIP ARTHROPLASTY ANTERIOR APPROACH;  Surgeon: Mcarthur Rossetti, MD;  Location: [place];  Service: Orthopedics;  Laterality: Left;   TOTAL SHOULDER ARTHROPLASTY Right 11/15/2019   Procedure: RIGHT TOTAL SHOULDER ARTHROPLASTY;  Surgeon: Meredith Pel, MD;  Location: [facility];  Service: Orthopedics;  Laterality: Right;   TUBAL LIGATION     UPPER GASTROINTESTINAL ENDOSCOPY      Family History  Problem Relation Age of Onset   Heart attack Father    Mental  illness Father    Mental illness Mother    Heart attack Brother        stents   Alcohol abuse Brother    Heart disease Brother    Drug abuse Brother    Diabetes Brother    Colon cancer Maternal Aunt    Cirrhosis Brother    Stomach cancer Neg Hx    Esophageal cancer Neg Hx    Rectal cancer Neg Hx     New complaints: None today  Social history: Lives by herself. Her daughters come and stay with her when she needs them  Controlled substance contract: 04/03/20     Review of Systems  Constitutional:  Negative for diaphoresis.  Eyes:  Negative for pain.  Respiratory:  Negative for shortness of breath.   Cardiovascular:  Negative for chest pain, palpitations and leg swelling.  Gastrointestinal:  Negative for abdominal pain.  Endocrine: Negative for polydipsia.  Genitourinary:  Positive for difficulty urinating, dysuria, flank pain, frequency, pelvic pain, vaginal bleeding, vaginal discharge and vaginal pain.  Skin:  Negative for rash.  Neurological:  Positive for dizziness (occasional) and  headaches (2-3 x a month). Negative for weakness.  Hematological:  Does not bruise/bleed easily.  All other systems reviewed and are negative.     Objective:   Physical Exam Vitals and nursing note reviewed.  Constitutional:      General: She is not in acute distress.    Appearance: Normal appearance. She is well-developed.  HENT:     Head: Normocephalic.     Right Ear: Tympanic membrane normal.     Left Ear: Tympanic membrane normal.     Nose: Nose normal.     Mouth/Throat:     Mouth: Mucous membranes are moist.  Eyes:     Pupils: Pupils are equal, round, and reactive to light.  Neck:     Vascular: No carotid bruit or JVD.  Cardiovascular:     Rate and Rhythm: Normal rate and regular rhythm.     Heart sounds: Normal heart sounds.  Pulmonary:     Effort: Pulmonary effort is normal. No respiratory distress.     Breath sounds: Normal breath sounds. No wheezing or rales.  Chest:     Chest wall: No tenderness.  Abdominal:     General: Bowel sounds are normal. There is no distension or abdominal bruit.     Palpations: Abdomen is soft. There is no hepatomegaly, splenomegaly, mass or pulsatile mass.     Tenderness: There is no abdominal tenderness.  Genitourinary:    General: Normal vulva.     Vagina: No vaginal discharge.     Rectum: Normal.     Comments: Cervix parous and pink No adnexal masses or tenderness Musculoskeletal:        General: Normal range of motion.     Cervical back: Normal range of motion and neck supple.  Lymphadenopathy:     Cervical: No cervical adenopathy.  Skin:    General: Skin is warm and dry.     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  Neurological:     Mental Status: She is alert and oriented to person, place, and time.     Deep Tendon Reflexes: Reflexes are normal and symmetric.  Psychiatric:        Behavior: Behavior normal.        Thought Content: Thought content normal.        Judgment: Judgment normal.    BP 116/80   Pulse  77   Temp 98.2 F (36.8 C) (Temporal)   Resp 20   Ht 5' 2" (1.575 m)   Wt 133 lb (60.3 kg)   SpO2 95%   BMI 24.33 kg/m        Assessment & Plan:  [name] comes in today with chief complaint of Annual Exam   Diagnosis and orders addressed:  1. Chronic diastolic CHF (congestive heart failure) ([place])  2. Essential hypertension - CBC with Differential/Platelet - CMP14+EGFR  3. Unstable angina ([place])  4. Obstructive sleep apnea syndrome  5. Diverticulosis  6. Gastroesophageal reflux disease with esophagitis without hemorrhage  7. Abnormal blood sugar  8. Other chronic pain  9. GAD (generalized anxiety disorder)  10. Herpes simplex virus (HSV) infection  11. Hyperlipidemia with target LDL less than 100 - Lipid panel  12. Primary insomnia  13. Pre-diabetes  14. SYNCOPE  15. Seizures ([place])  16. Annual physical exam - Urinalysis, Complete - Thyroid Panel With TSH - Cytology - PAP Continue all meds Exercise encouraged Keep follow up with all specialist   Labs pending Health Maintenance reviewed Diet and exercise encouraged  Follow up plan: RTO for pain management7 /1/22   [name] Hassell Done, FNP

## 2020-09-12 NOTE — Addendum Note (Signed)
Addended by: Cleda Daub on: 09/12/2020 10:23 AM   Modules accepted: Orders

## 2020-09-13 ENCOUNTER — Ambulatory Visit: Payer: Self-pay | Admitting: General Surgery

## 2020-09-13 LAB — CMP14+EGFR
ALT: 15 IU/L (ref 0–32)
AST: 14 IU/L (ref 0–40)
Albumin/Globulin Ratio: 2 (ref 1.2–2.2)
Albumin: 4.7 g/dL (ref 3.8–4.8)
Alkaline Phosphatase: 85 IU/L (ref 44–121)
BUN/Creatinine Ratio: 29 — ABNORMAL HIGH (ref 12–28)
BUN: 22 mg/dL (ref 8–27)
Bilirubin Total: 0.3 mg/dL (ref 0.0–1.2)
CO2: 23 mmol/L (ref 20–29)
Calcium: 10.1 mg/dL (ref 8.7–10.3)
Chloride: 101 mmol/L (ref 96–106)
Creatinine, Ser: 0.77 mg/dL (ref 0.57–1.00)
Globulin, Total: 2.3 g/dL (ref 1.5–4.5)
Glucose: 80 mg/dL (ref 65–99)
Potassium: 4.4 mmol/L (ref 3.5–5.2)
Sodium: 141 mmol/L (ref 134–144)
Total Protein: 7 g/dL (ref 6.0–8.5)
eGFR: 87 mL/min/{1.73_m2} (ref >59)

## 2020-09-13 LAB — CBC WITH DIFFERENTIAL/PLATELET
Basophils Absolute: 0 10*3/uL (ref 0.0–0.2)
Basos: 0 %
EOS (ABSOLUTE): 0 10*3/uL (ref 0.0–0.4)
Eos: 0 %
Hematocrit: 47.1 % — ABNORMAL HIGH (ref 34.0–46.6)
Hemoglobin: 15.6 g/dL (ref 11.1–15.9)
Immature Grans (Abs): 0.1 10*3/uL (ref 0.0–0.1)
Immature Granulocytes: 1 %
Lymphocytes Absolute: 2.8 10*3/uL (ref 0.7–3.1)
Lymphs: 32 %
MCH: 31.8 pg (ref 26.6–33.0)
MCHC: 33.1 g/dL (ref 31.5–35.7)
MCV: 96 fL (ref 79–97)
Monocytes Absolute: 0.7 10*3/uL (ref 0.1–0.9)
Monocytes: 8 %
Neutrophils Absolute: 5.3 10*3/uL (ref 1.4–7.0)
Neutrophils: 59 %
Platelets: 193 10*3/uL (ref 150–450)
RBC: 4.91 x10E6/uL (ref 3.77–5.28)
RDW: 11.3 % — ABNORMAL LOW (ref 11.7–15.4)
WBC: 8.9 10*3/uL (ref 3.4–10.8)

## 2020-09-13 LAB — LIPID PANEL
Chol/HDL Ratio: 3.2 ratio (ref 0.0–4.4)
Cholesterol, Total: 183 mg/dL (ref 100–199)
HDL: 57 mg/dL (ref >39)
LDL Chol Calc (NIH): 107 mg/dL — ABNORMAL HIGH (ref 0–99)
Triglycerides: 103 mg/dL (ref 0–149)
VLDL Cholesterol Cal: 19 mg/dL (ref 5–40)

## 2020-09-13 LAB — THYROID PANEL WITH TSH
Free Thyroxine Index: 1.3 (ref 1.2–4.9)
T3 Uptake Ratio: 21 % — ABNORMAL LOW (ref 24–39)
T4, Total: 6.3 ug/dL (ref 4.5–12.0)
TSH: 1.17 u[IU]/mL (ref 0.450–4.500)

## 2020-09-14 LAB — CYTOLOGY - PAP
Adequacy: ABSENT
Diagnosis: NEGATIVE

## 2020-09-14 LAB — URINE CULTURE

## 2020-09-26 ENCOUNTER — Ambulatory Visit: Payer: 59 | Admitting: Orthopedic Surgery

## 2020-09-27 ENCOUNTER — Other Ambulatory Visit: Payer: Self-pay | Admitting: Nurse Practitioner

## 2020-09-27 DIAGNOSIS — F411 Generalized anxiety disorder: Secondary | ICD-10-CM

## 2020-09-27 DIAGNOSIS — B009 Herpesviral infection, unspecified: Secondary | ICD-10-CM

## 2020-09-27 DIAGNOSIS — G44229 Chronic tension-type headache, not intractable: Secondary | ICD-10-CM

## 2020-09-28 ENCOUNTER — Other Ambulatory Visit: Payer: Self-pay

## 2020-09-28 ENCOUNTER — Encounter: Payer: Self-pay | Admitting: Nurse Practitioner

## 2020-09-28 ENCOUNTER — Ambulatory Visit (INDEPENDENT_AMBULATORY_CARE_PROVIDER_SITE_OTHER): Payer: 59 | Admitting: Nurse Practitioner

## 2020-09-28 DIAGNOSIS — G8929 Other chronic pain: Secondary | ICD-10-CM

## 2020-09-28 MED ORDER — HYDROCODONE-ACETAMINOPHEN 7.5-325 MG PO TABS: 1.0000 | ORAL_TABLET | Freq: Three times a day (TID) | ORAL | 0 refills | Status: AC

## 2020-09-28 MED ORDER — HYDROCODONE-ACETAMINOPHEN 7.5-325 MG PO TABS: 1.0000 | ORAL_TABLET | Freq: Three times a day (TID) | ORAL | 0 refills | Status: DC

## 2020-09-28 MED ORDER — OXYBUTYNIN CHLORIDE ER 5 MG PO TB24: 5.0000 mg | ORAL_TABLET | Freq: Every day | ORAL | 1 refills | Status: DC

## 2020-09-28 NOTE — Progress Notes (Signed)
Subjective:    Patient ID: [name], female    DOB: 1956-01-29, 65 y.o.   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- had total hip replacement Pain location- lower back, left hip pain Pain on scale of 1-10- 7/10 Frequency- daily What increases pain-to much activity What makes pain Better-rest helps Effects on ADL - none Any change in general medical condition-none  Current opioids rx- norco 7.5/325 tid # meds rx- 90 Effectiveness of current meds-helps Adverse reactions from pain meds-none Morphine equivalent- 22.5MME  Pill count performed-No Last drug screen - 3/11/221 ( high risk q60m, moderate risk q101m, low risk yearly ) Urine drug screen today- Yes Was the N[facility]SR reviewed- yes  If yes were their any concerning findings? - none   Overdose risk: 1  Pain contract signed on:04/03/20      Review of Systems  Constitutional:  Negative for diaphoresis.  Eyes:  Negative for pain.  Respiratory:  Negative for shortness of breath.   Cardiovascular:  Negative for chest pain, palpitations and leg swelling.  Gastrointestinal:  Negative for abdominal pain.  Endocrine: Negative for polydipsia.  Musculoskeletal:  Positive for arthralgias (left hip) and back pain (constant).  Skin:  Negative for rash.  Neurological:  Negative for dizziness, weakness and headaches.  Hematological:  Does not bruise/bleed easily.  All other systems reviewed and are negative.     Objective:   Physical Exam Constitutional:      Appearance: Normal appearance.  Cardiovascular:     Rate and Rhythm: Normal rate and regular rhythm.     Heart sounds: Normal heart sounds.  Pulmonary:     Effort: Pulmonary effort is normal.     Breath sounds: Normal breath sounds.  Musculoskeletal:     Comments: Pain with rising from sitting to standing  Skin:     General: Skin is warm.  Neurological:     General: No focal deficit present.     Mental Status: She is alert and oriented to person, place, and time.  Psychiatric:        Mood and Affect: Mood normal.        Behavior: Behavior normal.    BP 113/76   Pulse 62   Temp 98 F (36.7 C) (Oral)   Resp 20   Ht 5\' 2"  (1.575 m)   Wt 139 lb (63 kg)   SpO2 93%   BMI 25.42 kg/m        Assessment & Plan:   [name] in today with chief complaint of Pain Management   1. Other chronic pain Moist heat Rest Keep  follow up with surgeon - HYDROcodone-acetaminophen (NORCO) 7.5-325 MG tablet; Take 1 tablet by mouth in the morning, at noon, and at bedtime.  Dispense: 90 tablet; Refill: 0 - ToxASSURE Select 13 (MW), Urine - HYDROcodone-acetaminophen (NORCO) 7.5-325 MG tablet; Take 1 tablet by mouth in the morning, at noon, and at bedtime.  Dispense: 90 tablet; Refill: 0 - HYDROcodone-acetaminophen (NORCO) 7.5-325 MG tablet; Take 1 tablet by mouth in the morning, at noon, and at bedtime.  Dispense: 90 tablet; Refill: 0    The above assessment and management plan was discussed with the patient. The patient verbalized understanding of and has agreed to the management plan. Patient is aware to call the clinic if symptoms persist or worsen. Patient is aware when to return to the  clinic for a follow-up visit. Patient educated on when it is appropriate to go to the emergency department.   [name] Hassell Done, FNP

## 2020-09-28 NOTE — Patient Instructions (Signed)
Opioid Pain Medicine Management Opioid pain medicines are strong medicines that are used to treat bad or very bad pain. When you take them for a short time, they can help you: Sleep better. Do better in physical therapy. Feel better during the first few days after you get hurt. Recover from surgery. Only take these medicines if a doctor says that you can. You should only take them for a short time. This is because opioids can be hard to stop taking (they are addictive). The longer you take opioids, the harder it may be to stop taking them (opioid use disorder). What are the risks? Opioids can cause problems (side effects). Taking them for more than 3 days raises your chance of problems, such as: Trouble pooping (constipation). Feeling sick to your stomach (nausea). Vomiting. Feeling very sleepy. Confusion. Not being able to stop taking the medicine. Breathing problems. Taking opioids for a long time can make it hard for you to do daily tasks. It can also put you at risk for: Car accidents. Depression. Suicide. Heart attack. Taking too much of the medicine (overdose), which can sometimes lead to death. What is a pain treatment plan? A pain treatment plan is a plan made by you and your doctor. Work with your doctor to make a plan for treating your pain. To help you do this: Talk about the goals of your treatment, including: How much pain you might expect to have. How you will manage the pain. Talk about the risks and benefits of taking these medicines for your condition. Remember that a good treatment plan uses more than one approach and lowers the risks of side effects. Tell your doctor about the amount of medicines you take and about any drug or alcohol use. Get your pain medicine prescriptions from only one doctor. Pain can be managed with other treatments. Work with your doctor to find other ways to help your pain, such as: Physical therapy. Counseling. Eating healthy  foods. Brain exercises. Massage. Meditation. Other pain medicines. Doing gentle exercises. Tapering your use of opioids If you have been taking opioids for more than a few weeks, you may need to slowly decrease (taper) how much you take until you stop taking them. Doing this can lower your chance of having symptoms, such as: Pain and cramping in your belly (abdomen). Feeling sick to your stomach. Sweating. Feeling very sleepy. Feeling restless. Shaking you cannot control (tremors). Cravings for the medicine. Do not try to stop taking them by yourself. Work with your doctor to stop. Your doctorwill help you take less until you are not taking the medicine at all. Follow these instructions at home: Safety and storage  While you are taking opioids: Do not drive. Do not use machines or power tools. Do not sign important papers (legal documents). Do not drink alcohol. Do not take sleeping pills. Do not take care of children by yourself. Do not do activities where you need to climb or be in high places, like working on a ladder. Do not go into any water, such as a lake, river, ocean, swimming pool, or hot tub. Keep your opioids locked up or in a place where children cannot reach them. Do not share your pain medicine with anyone.  Getting rid of leftover pills Do not save any leftover pills. Get rid of leftover pills safely by: Taking them to a take-back program in your area. Bringing them to a pharmacy that has a container for throwing away pills (pill disposal). Flushing them down   the toilet. Check the label or package insert of your medicine to see whether this is safe to do. Throwing them in the trash. Check the label or package insert of your medicine to see whether this is safe to do. If it is safe to throw them out: Take the pills out of their container. Put the pills into a container you can seal. Mix the pills with used coffee grounds, food scraps, dirt, or cat litter. Put  this in the trash. Activity Return to your normal activities as told by your doctor. Ask your doctor what activities are safe for you. Avoid doing things that make your pain worse. Do exercises as told by your doctor. General instructions You may need to take these actions to prevent or treat trouble pooping: Drink enough fluid to keep your pee (urine) pale yellow. Take over-the-counter or prescription medicines. Eat foods that are high in fiber. These include beans, whole grains, and fresh fruits and vegetables. Limit foods that are high in fat and sugar. These include fried or sweet foods. Keep all follow-up visits as told by your doctor. This is important. Where to find support If you have been taking opioids for a long time, think about getting helpquitting from a local support group or counselor. Ask your doctor about this. Where to find more information Centers for Disease Control and Prevention (CDC): www.cdc.gov [place] Food and Drug Administration (FDA): www.fda.gov Get help right away if: Seek medical care right away if you are taking opioids and you, or people close to you, notice any of the following: You have trouble breathing. Your breathing is slower or more shallow than normal. You have a very slow heartbeat. You feel very confused. You pass out (faint). You are very sleepy. Your speech is not normal. You feel sick to your stomach and vomit. You have cold skin. You have blue lips or fingernails. Your muscles are weak (limp) and your body seems floppy. The black centers of your eyes (pupils) are smaller than normal. If you think that you or someone else may have taken too much of an opioid medicine, get medical help right away. Call your local emergency services (911 in the [place] Do not drive yourself to the hospital. If you ever feel like you may hurt yourself or others, or have thoughts about taking your own life, get help right away. You can go to your nearest  emergency department or call: Your local emergency services (911 in the [place] The hotline of the National Poison Control Center (1-800-222-1222 in the [place] A suicide crisis helpline, such as the National Suicide Prevention Lifeline at 1-800-273-8255. This is open 24 hours a day. Summary Opioid are strong medicines that are used to treat bad or very bad pain. A pain treatment plan is a plan made by you and your doctor. Work with your doctor to make a plan for treating your pain. Work with your doctor to find other ways to help your pain. If you think that you or someone else may have taken too much of an opioid, get help right away. This information is not intended to replace advice given to you by your health care provider. Make sure you discuss any questions you have with your healthcare provider. Document Revised: 03/09/2020 Document Reviewed: 04/16/2018 Elsevier Patient Education  2022 [employer]vier Inc.  

## 2020-10-05 LAB — TOXASSURE SELECT 13 (MW), URINE

## 2020-10-08 ENCOUNTER — Encounter: Payer: Self-pay | Admitting: Orthopaedic Surgery

## 2020-10-08 ENCOUNTER — Ambulatory Visit (INDEPENDENT_AMBULATORY_CARE_PROVIDER_SITE_OTHER): Payer: 59 | Admitting: Orthopaedic Surgery

## 2020-10-08 ENCOUNTER — Ambulatory Visit: Payer: Self-pay

## 2020-10-08 DIAGNOSIS — Z96642 Presence of left artificial hip joint: Secondary | ICD-10-CM

## 2020-10-08 MED ORDER — TIZANIDINE HCL 4 MG PO TABS: 4.0000 mg | ORAL_TABLET | Freq: Three times a day (TID) | ORAL | 1 refills | Status: DC | PRN

## 2020-10-08 NOTE — Progress Notes (Signed)
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  She is still dealing with significant bursitis and tendinitis around the left hip and IT band.  She is on chronic narcotic pain medication.  We were able to address her hip replacement and the arthritis pain is gone.  I think she was so off with her balance and coronation for so long it is changed her balance.  She had a significant lumbar spine surgery.  She is requesting another steroid injection today.  However, she does have upcoming robotic surgery for hernia and I would not recommend any steroid injection this soon out from surgery or this soon since her previous injection.  Someone is with her who is a massage therapist who is done a little bit of massage therapy on the soft tissue.  I think this is a great idea for her getting more aggressive with what she does is a massage therapist on her patient's hip.  Her hip is well located.  X-rays of the left hip and pelvis show well-seated hip replacement no complicating features.  Her pain is all with posterior to the incision and around the trochanteric area and the IT band.  I would like to set her up for at least several visits with outpatient physical therapy in [facility].  This is with the current facility there.  I would like him to try dry needling and other modalities to try to decrease her pain around her left proximal hip and IT band area.  This could include e-stim and even a TENS unit if indicated.  I will at least send in a stronger muscle relaxant for her and would like to see her back in about 2 months.

## 2020-10-09 ENCOUNTER — Ambulatory Visit (INDEPENDENT_AMBULATORY_CARE_PROVIDER_SITE_OTHER): Payer: 59 | Admitting: Physician Assistant

## 2020-10-09 ENCOUNTER — Encounter: Payer: Self-pay | Admitting: Physician Assistant

## 2020-10-09 DIAGNOSIS — L03116 Cellulitis of left lower limb: Secondary | ICD-10-CM

## 2020-10-09 DIAGNOSIS — L02416 Cutaneous abscess of left lower limb: Secondary | ICD-10-CM

## 2020-10-09 MED ORDER — CEPHALEXIN 500 MG PO CAPS: 500.0000 mg | ORAL_CAPSULE | Freq: Three times a day (TID) | ORAL | 0 refills | Status: DC

## 2020-10-09 NOTE — Progress Notes (Signed)
   Virtual Visit  Note Due to COVID-19 pandemic this visit was conducted virtually. This visit type was conducted due to national recommendations for restrictions regarding the COVID-19 Pandemic (e.g. social distancing, sheltering in place) in an effort to limit this patient's exposure and mitigate transmission in our community. All issues noted in this document were discussed and addressed.  A physical exam was not performed with this format.  I connected with [name] on 10/09/20 at  by telephone and verified that I am speaking with the correct person using two identifiers. [name] is currently located at home and  is currently with no one during visit. The provider, Caren Macadam, PA-C is located in their office at time of visit.  I discussed the limitations, risks, security and privacy concerns of performing an evaluation and management service by telephone and the availability of in person appointments. I also discussed with the patient that there may be a patient responsible charge related to this service. The patient expressed understanding and agreed to proceed.   History and Present Illness:  Pt with pain and a sore to the L lower posterior leg + drainage She does not remember injuring the area Worried due to her hx of diabetes Her BS have been sl elevated for her     Review of Systems  Skin:  Negative for itching and rash.       Sore to the posterior L leg just above the ankle    Observations/Objective: Defer  Assessment and Plan: L leg pain  Since she is diabteic want to error on side of caution Will start Keflex today Have nurse call pt and have her come in for in person appt tomm  Follow Up Instructions: Wound care reviewed Keep clean and dry    I discussed the assessment and treatment plan with the patient. The patient was provided an opportunity to ask questions and all were answered. The patient agreed with the plan and demonstrated an  understanding of the instructions.   The patient was advised to call back or seek an in-person evaluation if the symptoms worsen or if the condition fails to improve as anticipated.  The above assessment and management plan was discussed with the patient. The patient verbalized understanding of and has agreed to the management plan. Patient is aware to call the clinic if symptoms persist or worsen. Patient is aware when to return to the clinic for a follow-up visit. Patient educated on when it is appropriate to go to the emergency department.   Time call ended:    I provided 11 minutes of  non face-to-face time during this encounter.    Caren Macadam, PA-C

## 2020-10-09 NOTE — Patient Instructions (Signed)
Cellulitis, Adult Cellulitis is a skin infection. The infected area is often warm, red, swollen, and sore. It occurs most often in the arms and lower legs. It is very important to get treated for this condition. What are the causes? This condition is caused by bacteria. The bacteria enter through a break in the skin, such as a cut, burn, insect bite, open sore, or crack. What increases the risk? This condition is more likely to occur in people who: Have a weak body defense system (immune system). Have open cuts, burns, bites, or scrapes on the skin. Are older than 64 years of age. Have a blood sugar problem (diabetes). Have a long-lasting (chronic) liver disease (cirrhosis) or kidney disease. Are very overweight (obese). Have a skin problem, such as: Itchy rash (eczema). Slow movement of blood in the veins (venous stasis). Fluid buildup below the skin (edema). Have been treated with high-energy rays (radiation). Use IV drugs. What are the signs or symptoms? Symptoms of this condition include: Skin that is: Red. Streaking. Spotting. Swollen. Sore or painful when you touch it. Warm. A fever. Chills. Blisters. How is this diagnosed? This condition is diagnosed based on: Medical history. Physical exam. Blood tests. Imaging tests. How is this treated? Treatment for this condition may include: Medicines to treat infections or allergies. Home care, such as: Rest. Placing cold or warm cloths (compresses) on the skin. Hospital care, if the condition is very bad. Follow these instructions at home: Medicines Take over-the-counter and prescription medicines only as told by your doctor. If you were prescribed an antibiotic medicine, take it as told by your doctor. Do not stop taking it even if you start to feel better. General instructions  Drink enough fluid to keep your pee (urine) pale yellow. Do not touch or rub the infected area. Raise (elevate) the infected area above the  level of your heart while you are sitting or lying down. Place cold or warm cloths on the area as told by your doctor. Keep all follow-up visits as told by your doctor. This is important. Contact a doctor if: You have a fever. You do not start to get better after 1-2 days of treatment. Your bone or joint under the infected area starts to hurt after the skin has healed. Your infection comes back. This can happen in the same area or another area. You have a swollen bump in the area. You have new symptoms. You feel ill and have muscle aches and pains. Get help right away if: Your symptoms get worse. You feel very sleepy. You throw up (vomit) or have watery poop (diarrhea) for a long time. You see red streaks coming from the area. Your red area gets larger. Your red area turns dark in color. These symptoms may represent a serious problem that is an emergency. Do not wait to see if the symptoms will go away. Get medical help right away. Call your local emergency services (911 in the [place] Do not drive yourself to the hospital. Summary Cellulitis is a skin infection. The area is often warm, red, swollen, and sore. This condition is treated with medicines, rest, and cold and warm cloths. Take all medicines only as told by your doctor. Tell your doctor if symptoms do not start to get better after 1-2 days of treatment. This information is not intended to replace advice given to you by your health care provider. Make sure you discuss any questions you have with your health care provider. Document Revised: 08/06/2017 Document Reviewed:   08/06/2017 Elsevier Patient Education  2022 Elsevier Inc.  

## 2020-10-10 ENCOUNTER — Other Ambulatory Visit: Payer: Self-pay

## 2020-10-10 ENCOUNTER — Encounter: Payer: Self-pay | Admitting: Family Medicine

## 2020-10-10 ENCOUNTER — Encounter: Payer: Self-pay | Admitting: Orthopaedic Surgery

## 2020-10-10 ENCOUNTER — Ambulatory Visit (INDEPENDENT_AMBULATORY_CARE_PROVIDER_SITE_OTHER): Payer: 59 | Admitting: Family Medicine

## 2020-10-10 VITALS — BP 102/71 | HR 75 | Temp 98.0°F | Ht 62.0 in | Wt 136.8 lb

## 2020-10-10 DIAGNOSIS — S81802D Unspecified open wound, left lower leg, subsequent encounter: Secondary | ICD-10-CM | POA: Diagnosis not present

## 2020-10-10 NOTE — Progress Notes (Signed)
Acute Office Visit  Subjective:    Patient ID: [name], female    DOB: October 17, 1955, 65 y.o.   MRN: [id number]  Chief Complaint  Patient presents with   sore on left leg     HPI Patient is in today for follow up of cellulitis and abscess of left leg. She had a televisit yesterday and was started on Kelfex. She has a history elevated blood sugars. She reports that she is feeling better. The redness around the wound has resolved. The area is not as painful as it was. There is no drainage. Denies fever. She has been doing epsom salt soaks and neosporin to the wound. Reports her blood sugars have been in the upper 100s.   Past Medical History:  Diagnosis Date   Allergy    Anemia    Anginal pain ([place])    last time    Anxiety    Arthritis    RHEUMATOID   Asthma    Bipolar 1 disorder (HCC)    Blood transfusion without reported diagnosis    Cataracts, bilateral 07/2017   CHF (congestive heart failure) (HCC)    COPD (chronic obstructive pulmonary disease) (HCC)    Coronary artery disease    reported hx of "MI";  Echo 2009 with normal LVF;  Myoview 05/2011: no ischemia   Depression    Diabetes mellitus without complication (HCC)    Dyslipidemia    Dysrhythmia    SVT   Esophageal stricture    Fibromyalgia    GERD (gastroesophageal reflux disease)    H/O hiatal hernia    Head injury, unspecified    Headache    migraines   Herpes simplex infection    History of kidney stones    History of loop recorder    Managed by Dr. Crissie Sickles   Hyperlipidemia    Hypertension    Insomnia    Myocardial infarction [facility])    age 37   Osteoporosis    Pneumonia    hx   Seizures ([place])    Shortness of breath    Sleep apnea    ? neg   Spinal stenosis of lumbar region    Spondylolisthesis    Status post placement of implantable loop recorder    Supraventricular tachycardia (HCC)    Syncope and collapse    s/p ILR; no arhythmogenic cause identified   UTI (lower urinary tract  infection)     Past Surgical History:  Procedure Laterality Date   BACK SURGERY     BREAST SURGERY     lumpectomy   CARDIAC CATHETERIZATION  10/06/2011   CATARACT EXTRACTION W/PHACO Right 07/31/2017   Procedure: CATARACT EXTRACTION PHACO AND INTRAOCULAR LENS PLACEMENT ([place]);  Surgeon: [name], MD;  Location: [facility];  Service: Ophthalmology;  Laterality: Right;  CDE: 2.33   CATARACT EXTRACTION W/PHACO Left 08/14/2017   Procedure: CATARACT EXTRACTION PHACO AND INTRAOCULAR LENS PLACEMENT ([facility]);  Surgeon: [name], MD;  Location: [facility];  Service: Ophthalmology;  Laterality: Left;  CDE: 2.74   CHOLECYSTECTOMY     CYSTOSCOPY     stone   DIAGNOSTIC LAPAROSCOPY     laparoscopic cholecystectomy   DOPPLER ECHOCARDIOGRAPHY  2009   head up tilt table testing  06/15/2007   [name]   HEMORRHOID SURGERY     insertion of implatable loop recorder  08/11/2007   [name]   POSTERIOR CERVICAL FUSION/FORAMINOTOMY N/A 12/19/2013   Procedure: RIGHT C3-4.C4-5 AND C5-6 FORAMINOTOMIES;  Surgeon: Daleen Bo  Louanne Skye, MD;  Location: [place];  Service: Orthopedics;  Laterality: N/A;   TOTAL HIP ARTHROPLASTY Left 01/31/2020   Procedure: LEFT TOTAL HIP ARTHROPLASTY ANTERIOR APPROACH;  Surgeon: Mcarthur Rossetti, MD;  Location: [place];  Service: Orthopedics;  Laterality: Left;   TOTAL SHOULDER ARTHROPLASTY Right 11/15/2019   Procedure: RIGHT TOTAL SHOULDER ARTHROPLASTY;  Surgeon: Meredith Pel, MD;  Location: [facility];  Service: Orthopedics;  Laterality: Right;   TUBAL LIGATION     UPPER GASTROINTESTINAL ENDOSCOPY      Family History  Problem Relation Age of Onset   Heart attack Father    Mental illness Father    Mental illness Mother    Heart attack Brother        stents   Alcohol abuse Brother    Heart disease Brother    Drug abuse Brother    Diabetes Brother    Colon cancer Maternal Aunt    Cirrhosis Brother    Stomach cancer Neg Hx    Esophageal cancer Neg Hx    Rectal cancer Neg  Hx     Social History   Socioeconomic History   Marital status: Single    Spouse name: Not on file   Number of children: 5   Years of education: Not on file   Highest education level: 5th grade  Occupational History   Occupation: Disability    Comment: 15 years  Tobacco Use   Smoking status: Never   Smokeless tobacco: Never  Vaping Use   Vaping Use: Never used  Substance and Sexual Activity   Alcohol use: No    Alcohol/week: 0.0 standard drinks   Drug use: No   Sexual activity: Not Currently  Other Topics Concern   Not on file  Social History Narrative   Patient lives alone - 2 granddaughters live nearby - children are very involved   Patient is on disability.    Patient has 5th grade education.    Patient has 5 children, 24 grand-chilren, and 5 great-grand children.    Right handed    Social Determinants of Health   Financial Resource Strain: Medium Risk   Difficulty of Paying Living Expenses: Somewhat hard  Food Insecurity: Food Insecurity Present   Worried About [place] in the Last Year: Sometimes true   Ran Out of Food in the Last Year: Sometimes true  Transportation Needs: No Transportation Needs   Lack of Transportation (Medical): No   Lack of Transportation (Non-Medical): No  Physical Activity: Insufficiently Active   Days of Exercise per Week: 3 days   Minutes of Exercise per Session: 30 min  Stress: Stress Concern Present   Feeling of Stress : Rather much  Social Connections: Moderately Integrated   Frequency of Communication with Friends and Family: More than three times a week   Frequency of Social Gatherings with Friends and Family: More than three times a week   Attends Religious Services: More than 4 times per year   Active Member of [employer] or Organizations: Yes   Attends Music therapist: More than 4 times per year   Marital Status: Divorced  Human resources officer Violence: Not At Risk   Fear of Current or Ex-Partner: No    Emotionally Abused: No   Physically Abused: No   Sexually Abused: No    Outpatient Medications Prior to Visit  Medication Sig Dispense Refill   acyclovir ointment (ZOVIRAX) 5 % APPLY TOPICALLY EVERY 3 HOURS. 30 g 0   albuterol (PROVENTIL) (  2.5 MG/3ML) 0.083% nebulizer solution Take 3 mLs (2.5 mg total) by nebulization every 4 (four) hours as needed for wheezing or shortness of breath. 180 mL 0   albuterol (VENTOLIN HFA) 108 (90 Base) MCG/ACT inhaler INHALE 2 PUFFS EVERY 6 HOURS AS NEEDED FOR SHORTNESS OF BREATH AND WHEEZING. 8.5 g 2   Alcohol Swabs (GLOBAL ALCOHOL PREP EASE) 70 % PADS Test BS daily R73.03 100 each 3   busPIRone (BUSPAR) 10 MG tablet TAKE 1 TABLET BY MOUTH TWICE DAILY AS NEEDED. 60 tablet 0   butalbital-acetaminophen-caffeine (FIORICET) 50-325-40 MG tablet TAKE 1 TABLET BY MOUTH EVERY 6 HOURS AS NEEDED FOR HEADACHE. 20 tablet 0   carvedilol (COREG) 6.25 MG tablet TAKE (1) TABLET TWICE DAILY. 60 tablet 1   celecoxib (CELEBREX) 200 MG capsule TAKE 1 CAPSULE BY MOUTH TWICE DAILY BETWEEN MEALS AS NEEDED. 60 capsule 0   cephALEXin (KEFLEX) 500 MG capsule Take 1 capsule (500 mg total) by mouth 3 (three) times daily. 30 capsule 0   dexlansoprazole (DEXILANT) 60 MG capsule Take 1 capsule (60 mg total) by mouth daily. 90 capsule 1   diclofenac Sodium (VOLTAREN) 1 % GEL Apply 4 g topically 4 (four) times daily. (Patient taking differently: Apply 4 g topically 4 (four) times daily as needed (pain).) 500 g 3   DULoxetine (CYMBALTA) 60 MG capsule Take 1 capsule (60 mg total) by mouth at bedtime. 90 capsule 1   empagliflozin (JARDIANCE) 25 MG TABS tablet Take 1 tablet (25 mg total) by mouth daily before breakfast. 90 tablet 3   escitalopram (LEXAPRO) 20 MG tablet Take 1 tablet (20 mg total) by mouth daily. 90 tablet 1   estradiol (ESTRACE) 0.1 MG/GM vaginal cream Place 1 Applicatorful vaginally at bedtime. 42.5 g 0   FIBER PO Take 2 tablets by mouth in the morning, at noon, and at bedtime.      fludrocortisone (FLORINEF) 0.1 MG tablet TAKE 1 TABLET EVERY OTHER DAY. 15 tablet 1   fluticasone (FLONASE) 50 MCG/ACT nasal spray USE 1 SPRAY IN EACH NOSTRIL ONCE DAILY. 16 g 5   fluticasone furoate-vilanterol (BREO ELLIPTA) 200-25 MCG/INH AEPB Inhale 1 puff into the lungs daily. 60 each 5   furosemide (LASIX) 40 MG tablet Take 1 tablet (40 mg total) by mouth daily. 90 tablet 1   glucose blood (ONETOUCH VERIO) test strip Test BS daily R73.03 100 strip 3   hydrALAZINE (APRESOLINE) 25 MG tablet TAKE 1 TABLET BY MOUTH 3 TIMES DAILY AS NEEDED (SEVERE HYPERTENSION/SYSTOLIC NUMBER 716 OR GREATER). 90 tablet 1   [START ON 11/27/2020] HYDROcodone-acetaminophen (NORCO) 7.5-325 MG tablet Take 1 tablet by mouth in the morning, at noon, and at bedtime. 90 tablet 0   [START ON 10/28/2020] HYDROcodone-acetaminophen (NORCO) 7.5-325 MG tablet Take 1 tablet by mouth in the morning, at noon, and at bedtime. 90 tablet 0   HYDROcodone-acetaminophen (NORCO) 7.5-325 MG tablet Take 1 tablet by mouth in the morning, at noon, and at bedtime. 90 tablet 0   ipratropium (ATROVENT) 0.02 % nebulizer solution USE 1 VIAL (3ML) IN NEBULIZER EVERY 6 HOURS AS NEEDED FOR WHEEZING OR SHORTNESS OF BREATH. 150 mL 2   isosorbide mononitrate (IMDUR) 30 MG 24 hr tablet TAKE 1 TABLET ONCE DAILY. 30 tablet 0   lamoTRIgine (LAMICTAL) 150 MG tablet Take 1 tablet (150 mg total) by mouth daily. 90 tablet 1   Lancets (ONETOUCH DELICA PLUS RCVELF81O) MISC USE TO CHECK SUGAR DAILY. 300 each 5   levETIRAcetam (KEPPRA) 500 MG tablet TAKE (  1) TABLET TWICE DAILY. 180 tablet 1   levocetirizine (XYZAL) 5 MG tablet TAKE 1 TABLET BY MOUTH EVERY MORNING. 30 tablet 3   lidocaine (XYLOCAINE) 5 % ointment APPLY TO AFFECTED AREA 3 TIMES ADAY AS NEEDED FOR MILD OR MODERATE PAIN. 35.44 g 0   methocarbamol (ROBAXIN) 500 MG tablet TAKE (1) TABLET BY MOUTH EVERY SIX HOURS AS NEEDED 40 tablet 0   mupirocin ointment (BACTROBAN) 2 % APPLY TO AFFECTED AREA TWICE DAILY.  22 g 2   nitroGLYCERIN (NITROSTAT) 0.4 MG SL tablet PLACE ONE (1) TABLET UNDER TONGUE EVERY 5 MINUTES UP TO (3) DOSES AS NEEDED FOR CHEST PAIN. 25 tablet 1   ondansetron (ZOFRAN) 4 MG tablet TAKE 1 TABLET BY MOUTH EVERY 8 HOURS AS NEEDED FOR NAUSEA AND VOMITING. 20 tablet 0   oxybutynin (DITROPAN-XL) 5 MG 24 hr tablet Take 1 tablet (5 mg total) by mouth at bedtime. 90 tablet 1   rosuvastatin (CRESTOR) 10 MG tablet Take 1 tablet (10 mg total) by mouth daily. 90 tablet 1   sucralfate (CARAFATE) 1 g tablet Take 1 tablet (1 g total) by mouth 2 (two) times daily. 90 tablet 1   tiZANidine (ZANAFLEX) 4 MG tablet Take 1 tablet (4 mg total) by mouth every 8 (eight) hours as needed for muscle spasms. 60 tablet 1   traZODone (DESYREL) 150 MG tablet Take 1 tablet (150 mg total) by mouth at bedtime. 90 tablet 1   valACYclovir (VALTREX) 1000 MG tablet TAKE 1 TABLET 3 TIMES A DAY. TO BE TAKEN ONLY WHEN SHE HAS OUT BREAK. 21 tablet 0   valACYclovir (VALTREX) 500 MG tablet Take 1 tablet (500 mg total) by mouth 2 (two) times daily. 180 tablet 1   No facility-administered medications prior to visit.    Allergies  Allergen Reactions   Codeine Other (See Comments)    "I will have a heart attack."   Morphine And Related Other (See Comments)    "It will cause me to have a heart attack."   Ambien [Zolpidem Tartrate] Nausea And Vomiting   Clonidine Derivatives Nausea And Vomiting    gerd - caused acid reflux per pt   Metformin And Related     Nausea/vomiting/cramping from metformin   Lyrica [Pregabalin] Swelling and Other (See Comments)    Weight gain   Neurontin [Gabapentin] Other (See Comments)    Causes elevated LFTs     Review of Systems As per HPI.     Objective:    Physical Exam Vitals and nursing note reviewed.  Constitutional:      General: She is not in acute distress.    Appearance: She is not ill-appearing, toxic-appearing or diaphoretic.  Pulmonary:     Effort: Pulmonary effort is  normal. No respiratory distress.  Musculoskeletal:     Right lower leg: No edema.     Left lower leg: No edema.  Skin:    General: Skin is warm and dry.     Findings: Wound (1 cm shallow wound to posterior lower left leg. Granulaiton base. No drainage. No erythema. Mild tenderness.) present.  Neurological:     Mental Status: She is alert.    BP 102/71   Pulse 75   Temp 98 F (36.7 C) (Temporal)   Ht _0  (1.575 m)   Wt 136 lb 12.8 oz (62.1 kg)   BMI 25.02 kg/m  Wt Readings from Last 3 Encounters:  10/10/20 136 lb 12.8 oz (62.1 kg)  09/28/20 139 lb (63 kg)  09/12/20 133 lb (60.3 kg)    There are no preventive care reminders to display for this patient.  There are no preventive care reminders to display for this patient.   Lab Results  Component Value Date   TSH 1.170 09/12/2020   Lab Results  Component Value Date   WBC 8.9 09/12/2020   HGB 15.6 09/12/2020   HCT 47.1 (H) 09/12/2020   MCV 96 09/12/2020   PLT 193 09/12/2020   Lab Results  Component Value Date   NA 141 09/12/2020   K 4.4 09/12/2020   CO2 23 09/12/2020   GLUCOSE 80 09/12/2020   BUN 22 09/12/2020   CREATININE 0.77 09/12/2020   BILITOT 0.3 09/12/2020   ALKPHOS 85 09/12/2020   AST 14 09/12/2020   ALT 15 09/12/2020   PROT 7.0 09/12/2020   ALBUMIN 4.7 09/12/2020   CALCIUM 10.1 09/12/2020   ANIONGAP 8 02/01/2020   EGFR 87 09/12/2020   GFR 67.10 11/18/2012   Lab Results  Component Value Date   CHOL 183 09/12/2020   Lab Results  Component Value Date   HDL 57 09/12/2020   Lab Results  Component Value Date   LDLCALC 107 (H) 09/12/2020   Lab Results  Component Value Date   TRIG 103 09/12/2020   Lab Results  Component Value Date   CHOLHDL 3.2 09/12/2020   Lab Results  Component Value Date   HGBA1C 5.9 06/26/2020       Assessment & Plan:   Anhthu was seen today for sore on left leg .  Diagnoses and all orders for this visit:  Wound of left lower extremity, subsequent  encounter On Keflex, appear cellulitis has improved. Discussed wound care, return precautions.   Return in about 1 week (around 10/17/2020) for wound follow up.  The patient indicates understanding of these issues and agrees with the plan.    Gwenlyn Perking, FNP

## 2020-10-10 NOTE — Patient Instructions (Signed)
Wound Care, Adult Taking care of your wound properly can help to prevent pain, infection, and scarring. It can also help your wound heal more quickly. Follow instructionsfrom your health care provider about how to care for your wound. Supplies needed: Soap and water. Wound cleanser. Gauze. If needed, a clean bandage (dressing) or other type of wound dressing material to cover or place in the wound. Follow your health care provider's instructions about what dressing supplies to use. Cream or ointment to apply to the wound, if told by your health care provider. How to care for your wound Cleaning the wound Ask your health care provider how to clean the wound. This may include: Using mild soap and water or a wound cleanser. Using a clean gauze to pat the wound dry after cleaning it. Do not rub or scrub the wound. Dressing care Wash your hands with soap and water for at least 20 seconds before and after you change the dressing. If soap and water are not available, use hand sanitizer. Change your dressing as told by your health care provider. This may include: Cleaning or rinsing out (irrigating) the wound. Placing a dressing over the wound or in the wound (packing). Covering the wound with an outer dressing. Leave any stitches (sutures), skin glue, or adhesive strips in place. These skin closures may need to stay in place for 2 weeks or longer. If adhesive strip edges start to loosen and curl up, you may trim the loose edges. Do not remove adhesive strips completely unless your health care provider tells you to do that. Ask your health care provider when you can leave the wound uncovered. Checking for infection Check your wound area every day for signs of infection. Check for: More redness, swelling, or pain. Fluid or blood. Warmth. Pus or a bad smell.  Follow these instructions at home Medicines If you were prescribed an antibiotic medicine, cream, or ointment, take or apply it as told by  your health care provider. Do not stop using the antibiotic even if your condition improves. If you were prescribed pain medicine, take it 30 minutes before you do any wound care or as told by your health care provider. Take over-the-counter and prescription medicines only as told by your health care provider. Eating and drinking Eat a diet that includes protein, vitamin A, vitamin C, and other nutrient-rich foods to help the wound heal. Foods rich in protein include meat, fish, eggs, dairy, beans, and nuts. Foods rich in vitamin A include carrots and dark green, leafy vegetables. Foods rich in vitamin C include citrus fruits, tomatoes, broccoli, and peppers. Drink enough fluid to keep your urine pale yellow. General instructions Do not take baths, swim, use a hot tub, or do anything that would put the wound underwater until your health care provider approves. Ask your health care provider if you may take showers. You may only be allowed to take sponge baths. Do not scratch or pick at the wound. Keep it covered as told by your health care provider. Return to your normal activities as told by your health care provider. Ask your health care provider what activities are safe for you. Protect your wound from the sun when you are outside for the first 6 months, or for as long as told by your health care provider. Cover up the scar area or apply sunscreen that has an SPF of at least 30. Do not use any products that contain nicotine or tobacco, such as cigarettes, e-cigarettes, and chewing tobacco.   These may delay wound healing. If you need help quitting, ask your health care provider. Keep all follow-up visits as told by your health care provider. This is important. Contact a health care provider if: You received a tetanus shot and you have swelling, severe pain, redness, or bleeding at the injection site. Your pain is not controlled with medicine. You have any of these signs of infection: More  redness, swelling, or pain around the wound. Fluid or blood coming from the wound. Warmth coming from the wound. Pus or a bad smell coming from the wound. A fever or chills. You are nauseous or you vomit. You are dizzy. Get help right away if: You have a red streak of skin near the area around your wound. Your wound has been closed with staples, sutures, skin glue, or adhesive strips and it begins to open up and separate. Your wound is bleeding, and the bleeding does not stop with gentle pressure. You have a rash. You faint. You have trouble breathing. These symptoms may represent a serious problem that is an emergency. Do not wait to see if the symptoms will go away. Get medical help right away. Call your local emergency services (911 in the [place] Do not drive yourself to the hospital. Summary Always wash your hands with soap and water for at least 20 seconds before and after changing your dressing. Change your dressing as told by your health care provider. To help with healing, eat foods that are rich in protein, vitamin A, vitamin C, and other nutrients. Check your wound every day for signs of infection. Contact your health care provider if you suspect that your wound is infected. This information is not intended to replace advice given to you by your health care provider. Make sure you discuss any questions you have with your healthcare provider. Document Revised: 12/31/2018 Document Reviewed: 12/31/2018 Elsevier Patient Education  2022 [employer]  

## 2020-10-10 NOTE — Patient Instructions (Addendum)
DUE TO COVID-19 ONLY ONE VISITOR IS ALLOWED TO COME WITH YOU AND STAY IN THE WAITING ROOM ONLY DURING PRE OP AND PROCEDURE DAY OF SURGERY. THE 1 VISITOR  MAY VISIT WITH YOU AFTER SURGERY IN YOUR PRIVATE ROOM DURING VISITING HOURS ONLY!  YOU NEED TO HAVE A COVID 19 TEST ON: 10/29/20 , THIS TEST MUST BE DONE BEFORE SURGERY,  COVID TESTING SITE ,                                                                                                                                                                                               ONCE YOUR COVID TEST IS COMPLETED,  PLEASE BEGIN THE QUARANTINE INSTRUCTIONS AS OUTLINED IN YOUR HANDOUT.                [name]   Your procedure is scheduled on: 10/31/20   Report to [facility] Main  Entrance   Report to admitting at : 6:30 AM    Call this number if you have problems the morning of surgery [phone]    Remember: Eat a light diet the day before surgery.  Examples including soups, broths, toast, yogurt, mashed potatoes.  Things to avoid include carbonated beverages (fizzy beverages), raw fruits and raw vegetables, or beans.   If your bowels are filled with gas, your surgeon will have difficulty visualizing your pelvic organs which increases your surgical risks. NO SOLID FOOD AFTER MIDNIGHT THE NIGHT PRIOR TO SURGERY. NOTHING BY MOUTH EXCEPT CLEAR LIQUIDS UNTIL: 5:30 AM . PLEASE FINISH GATORADE DRINK PER SURGEON ORDER  WHICH NEEDS TO BE COMPLETED AT : 5:30 AM.   CLEAR LIQUID DIET  Foods Allowed                                                                     Foods Excluded  Coffee and tea, regular and decaf                             liquids that you cannot  Plain Jell-O any favor except red or purple  see through such as: Fruit ices (not with fruit pulp)                                     milk, soups, orange juice  Iced Popsicles                                    All solid  food Carbonated beverages, regular and diet                                    Cranberry, grape and apple juices Sports drinks like Gatorade Lightly seasoned clear broth or consume(fat free) Sugar, honey syrup  Sample Menu Breakfast                                Lunch                                     Supper Cranberry juice                    Beef broth                            Chicken broth Jell-O                                     Grape juice                           Apple juice Coffee or tea                        Jell-O                                      Popsicle                                                Coffee or tea                        Coffee or tea  _____________________________________________________________________   BRUSH YOUR TEETH MORNING OF SURGERY AND RINSE YOUR MOUTH OUT, NO CHEWING GUM CANDY OR MINTS.    Take these medicines the morning of surgery with A SIP OF WATER: Isosorbide,buspar,lexapro,lamictal,valacyclovir,keflex,dexilant,carafate.Inhalers as usual.Kepra and apresolin as needed.  How to Manage Your Diabetes Before and After Surgery  Why is it important to control my blood sugar before and after surgery? Improving blood sugar levels before and after surgery helps healing and can limit problems. A way of improving blood sugar control is eating a healthy diet by:  Eating less sugar and carbohydrates  Increasing activity/exercise  Talking with your doctor about reaching your blood sugar goals High blood  sugars (greater than 180 mg/dL) can raise your risk of infections and slow your recovery, so you will need to focus on controlling your diabetes during the weeks before surgery. Make sure that the doctor who takes care of your diabetes knows about your planned surgery including the date and location.  How do I manage my blood sugar before surgery? Check your blood sugar at least 4 times a day, starting 2 days before surgery, to make sure that the  level is not too high or low. Check your blood sugar the morning of your surgery when you wake up and every 2 hours until you get to the Short Stay unit. If your blood sugar is less than 70 mg/dL, you will need to treat for low blood sugar: Do not take insulin. Treat a low blood sugar (less than 70 mg/dL) with  cup of clear juice (cranberry or apple), 4 glucose tablets, OR glucose gel. Recheck blood sugar in 15 minutes after treatment (to make sure it is greater than 70 mg/dL). If your blood sugar is not greater than 70 mg/dL on recheck, call [phone] for further instructions. Report your blood sugar to the short stay nurse when you get to Short Stay.  If you are admitted to the hospital after surgery: Your blood sugar will be checked by the staff and you will probably be given insulin after surgery (instead of oral diabetes medicines) to make sure you have good blood sugar levels. The goal for blood sugar control after surgery is 80-180 mg/dL.   WHAT DO I DO ABOUT MY DIABETES MEDICATION?  Do not take oral diabetes medicines (pills) the morning of surgery.  DO NOT TAKE JARDIANCE THE DAY BEFORE SURGERY.     THE MORNING OF SURGERY,DO NOT TAKE ANY ORAL DIABETIC MEDICATIONS DAY OF YOUR SURGERY                               You may not have any metal on your body including hair pins and              piercings  Do not wear jewelry, make-up, lotions, powders or perfumes, deodorant             Do not wear nail polish on your fingernails.  Do not shave  48 hours prior to surgery.    Do not bring valuables to the hospital. [facility] IS NOT             RESPONSIBLE   FOR VALUABLES.  Contacts, dentures or bridgework may not be worn into surgery.  Leave suitcase in the car. After surgery it may be brought to your room.     Patients discharged the day of surgery will not be allowed to drive home. IF YOU ARE HAVING SURGERY AND GOING HOME THE SAME DAY, YOU MUST HAVE AN ADULT TO DRIVE YOU HOME  AND BE WITH YOU FOR 24 HOURS. YOU MAY GO HOME BY TAXI OR UBER OR ORTHERWISE, BUT AN ADULT MUST ACCOMPANY YOU HOME AND STAY WITH YOU FOR 24 HOURS.  Name and phone number of your driver:  Special Instructions: N/A              Please read over the following fact sheets you were given: _____________________________________________________________________           [facility] - Preparing for Surgery Before surgery, you can play an important role.  Because skin is not  sterile, your skin needs to be as free of germs as possible.  You can reduce the number of germs on your skin by washing with CHG (chlorahexidine gluconate) soap before surgery.  CHG is an antiseptic cleaner which kills germs and bonds with the skin to continue killing germs even after washing. Please DO NOT use if you have an allergy to CHG or antibacterial soaps.  If your skin becomes reddened/irritated stop using the CHG and inform your nurse when you arrive at Short Stay. Do not shave (including legs and underarms) for at least 48 hours prior to the first CHG shower.  You may shave your face/neck. Please follow these instructions carefully:  1.  Shower with CHG Soap the night before surgery and the  morning of Surgery.  2.  If you choose to wash your hair, wash your hair first as usual with your  normal  shampoo.  3.  After you shampoo, rinse your hair and body thoroughly to remove the  shampoo.                           4.  Use CHG as you would any other liquid soap.  You can apply chg directly  to the skin and wash                       Gently with a scrungie or clean washcloth.  5.  Apply the CHG Soap to your body ONLY FROM THE NECK DOWN.   Do not use on face/ open                           Wound or open sores. Avoid contact with eyes, ears mouth and genitals (private parts).                       Wash face,  Genitals (private parts) with your normal soap.             6.  Wash thoroughly, paying special attention to the area where  your surgery  will be performed.  7.  Thoroughly rinse your body with warm water from the neck down.  8.  DO NOT shower/wash with your normal soap after using and rinsing off  the CHG Soap.                9.  Pat yourself dry with a clean towel.            10.  Wear clean pajamas.            11.  Place clean sheets on your bed the night of your first shower and do not  sleep with pets. Day of Surgery : Do not apply any lotions/deodorants the morning of surgery.  Please wear clean clothes to the hospital/surgery center.  FAILURE TO FOLLOW THESE INSTRUCTIONS MAY RESULT IN THE CANCELLATION OF YOUR SURGERY PATIENT SIGNATURE_________________________________  NURSE SIGNATURE__________________________________  ________________________________________________________________________

## 2020-10-11 ENCOUNTER — Encounter (HOSPITAL_COMMUNITY): Payer: Self-pay

## 2020-10-11 ENCOUNTER — Encounter (HOSPITAL_COMMUNITY)
Admission: RE | Admit: 2020-10-11 | Discharge: 2020-10-11 | Disposition: A | Discharge status: Home / Self Care | Payer: 59 | Source: Ambulatory Visit | Attending: General Surgery | Admitting: General Surgery

## 2020-10-11 ENCOUNTER — Other Ambulatory Visit: Payer: Self-pay

## 2020-10-11 DIAGNOSIS — Z01812 Encounter for preprocedural laboratory examination: Secondary | ICD-10-CM | POA: Diagnosis present

## 2020-10-11 LAB — HEMOGLOBIN A1C
Hgb A1c MFr Bld: 6 % — ABNORMAL HIGH (ref 4.8–5.6)
Mean Plasma Glucose: 125.5 mg/dL

## 2020-10-11 LAB — COMPREHENSIVE METABOLIC PANEL
ALT: 19 U/L (ref 0–44)
AST: 23 U/L (ref 15–41)
Albumin: 4.4 g/dL (ref 3.5–5.0)
Alkaline Phosphatase: 89 U/L (ref 38–126)
Anion gap: 6 (ref 5–15)
BUN: 19 mg/dL (ref 8–23)
CO2: 27 mmol/L (ref 22–32)
Calcium: 9.6 mg/dL (ref 8.9–10.3)
Chloride: 106 mmol/L (ref 98–111)
Creatinine, Ser: 0.7 mg/dL (ref 0.44–1.00)
GFR, Estimated: >60 mL/min (ref >60)
Glucose, Bld: 91 mg/dL (ref 70–99)
Potassium: 4.4 mmol/L (ref 3.5–5.1)
Sodium: 139 mmol/L (ref 135–145)
Total Bilirubin: 0.5 mg/dL (ref 0.3–1.2)
Total Protein: 7.4 g/dL (ref 6.5–8.1)

## 2020-10-11 LAB — CBC WITH DIFFERENTIAL/PLATELET
Abs Immature Granulocytes: 0.02 10*3/uL (ref 0.00–0.07)
Basophils Absolute: 0.1 10*3/uL (ref 0.0–0.1)
Basophils Relative: 1 %
Eosinophils Absolute: 0.2 10*3/uL (ref 0.0–0.5)
Eosinophils Relative: 3 %
HCT: 49.5 % — ABNORMAL HIGH (ref 36.0–46.0)
Hemoglobin: 15.8 g/dL — ABNORMAL HIGH (ref 12.0–15.0)
Immature Granulocytes: 0 %
Lymphocytes Relative: 29 %
Lymphs Abs: 2 10*3/uL (ref 0.7–4.0)
MCH: 31 pg (ref 26.0–34.0)
MCHC: 31.9 g/dL (ref 30.0–36.0)
MCV: 97.2 fL (ref 80.0–100.0)
Monocytes Absolute: 0.7 10*3/uL (ref 0.1–1.0)
Monocytes Relative: 10 %
Neutro Abs: 3.9 10*3/uL (ref 1.7–7.7)
Neutrophils Relative %: 57 %
Platelets: 200 10*3/uL (ref 150–400)
RBC: 5.09 MIL/uL (ref 3.87–5.11)
RDW: 11.9 % (ref 11.5–15.5)
WBC: 6.9 10*3/uL (ref 4.0–10.5)
nRBC: 0 % (ref 0.0–0.2)

## 2020-10-11 LAB — GLUCOSE, CAPILLARY: Glucose-Capillary: 93 mg/dL (ref 70–99)

## 2020-10-11 NOTE — Progress Notes (Addendum)
COVID Vaccine Completed: Yes Date COVID Vaccine completed: 06/21/20 COVID vaccine manufacturer: Moderna      PCP -  [name]: FNP Cardiologist - Dr. Dina Rich. Clearance: Greig Castilla Quinn:NP: 09/11/20: EPIC  Chest x-ray -  EKG -  Stress Test -  ECHO - 01/18/19 Cardiac Cath -  Pacemaker/ICD device last checked: Inactive loop recorder.  Sleep Study - Yes CPAP - NO  Fasting Blood Sugar - 100's  Checks Blood Sugar __3___ times a day  Blood Thinner Instructions: Aspirin Instructions: Last Dose:  Anesthesia review: Hx: Angina,CHF,COPD,DIA,Dysrhythmias,MI,Seizures(last episode 2021),OSA(No CPAP)the patient able to go up a flight of stairs without getting SOB.,LtLL wound that has been taking care by a wound nurse is dry with scab on.  Patient denies shortness of breath, fever, cough and chest pain at PAT appointment   Patient verbalized understanding of instructions that were given to them at the PAT appointment. Patient was also instructed that they will need to review over the PAT instructions again at home before surgery.

## 2020-10-17 IMAGING — CT CT ABD-PELV W/ CM
3 of 5 series · 16 of 46 positions shown, 18 images · IV contrast (Omnipaque or Isovue)
Comparison: 12/12/2016.

CLINICAL DATA: Diffuse abdominal pain for the past 6 days. Nausea
and vomiting 2 days ago.

EXAM:
CT ABDOMEN AND PELVIS WITH CONTRAST
TECHNIQUE: Multidetector CT imaging of the abdomen and pelvis was performed
using the standard protocol following bolus administration of
intravenous contrast.
CONTRAST:  100mL OMNIPAQUE IOHEXOL 300 MG/ML  SOLN

[Series 3: axial st · axial · 0.83mm/px · z∈[-656,-301]mm · 11 of 87 slices shown, 13 images]
[im 8/87  soft-tissue]
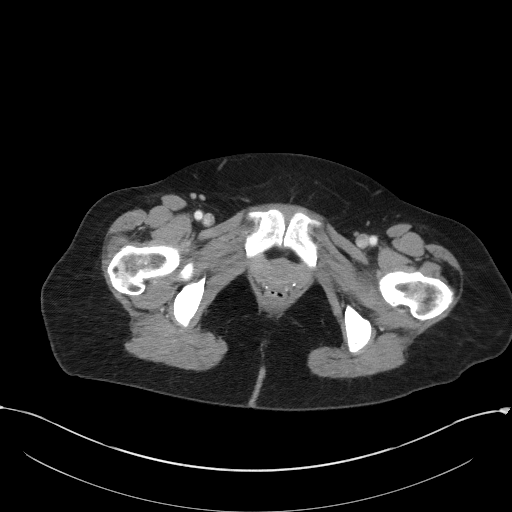
[im 8/87  bone]
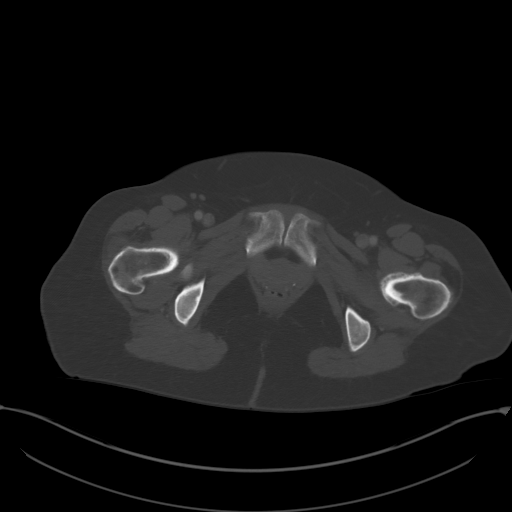
[im 15/87  soft-tissue]
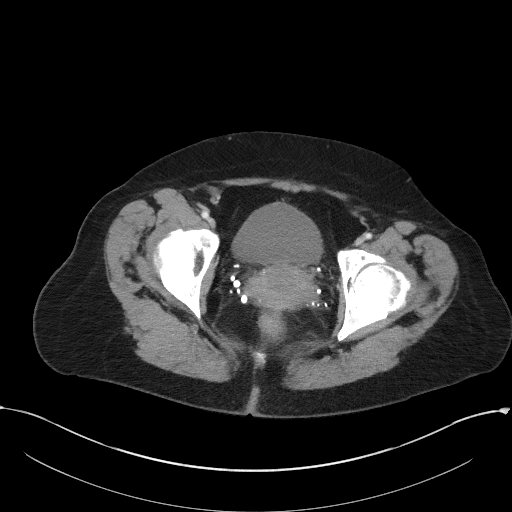
[im 22/87  soft-tissue]
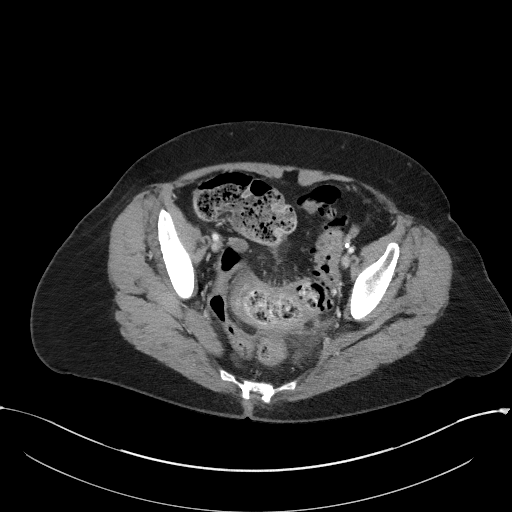
[im 29/87  soft-tissue]
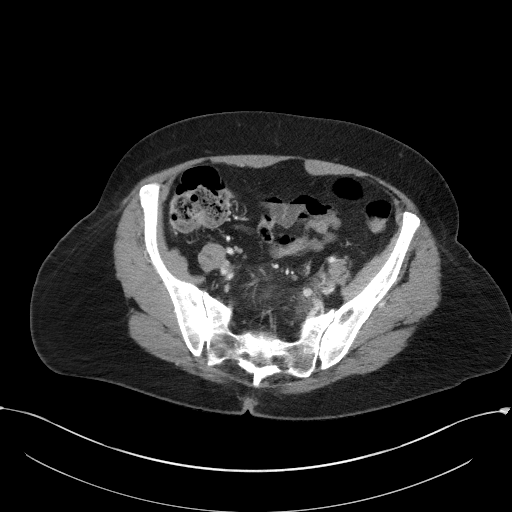
[im 36/87  soft-tissue]
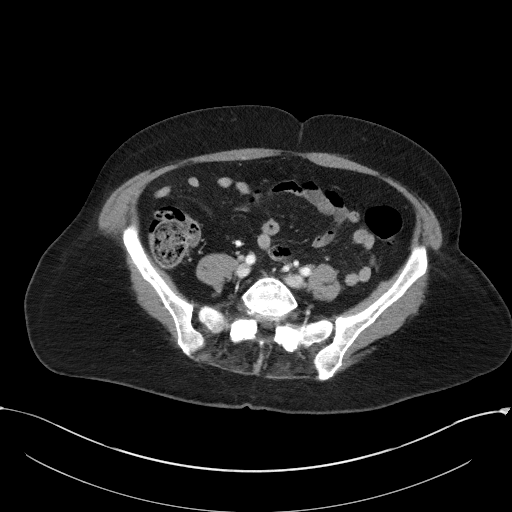
[im 44/87  soft-tissue]
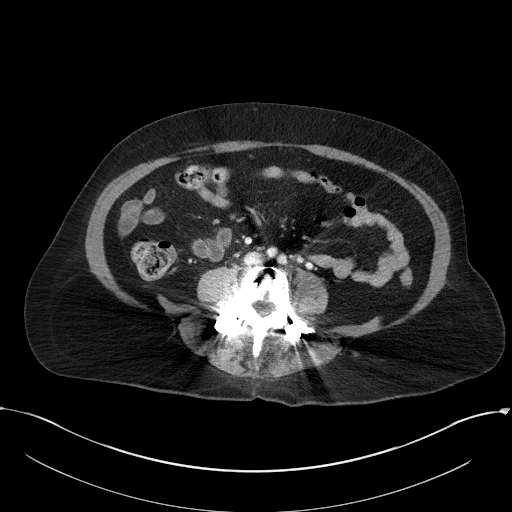
[im 51/87  soft-tissue]
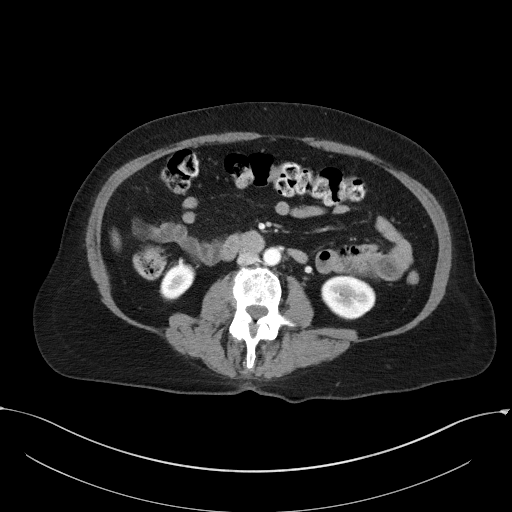
[im 58/87  soft-tissue]
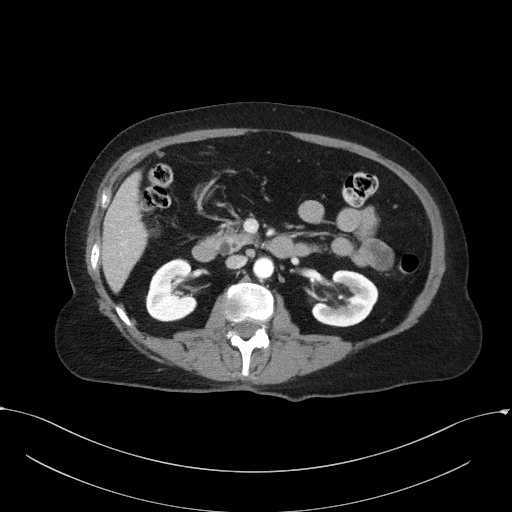
[im 65/87  soft-tissue]
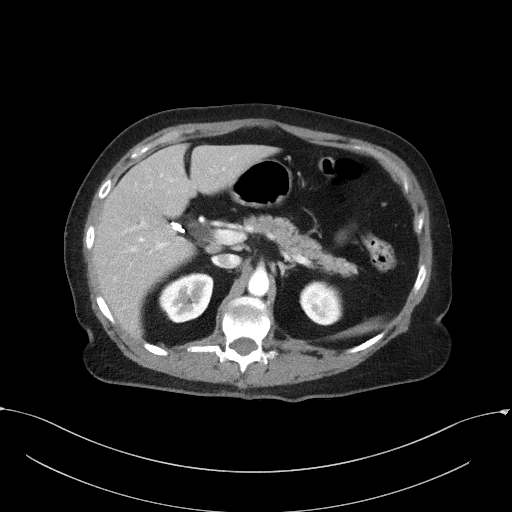
[im 65/87  bone]
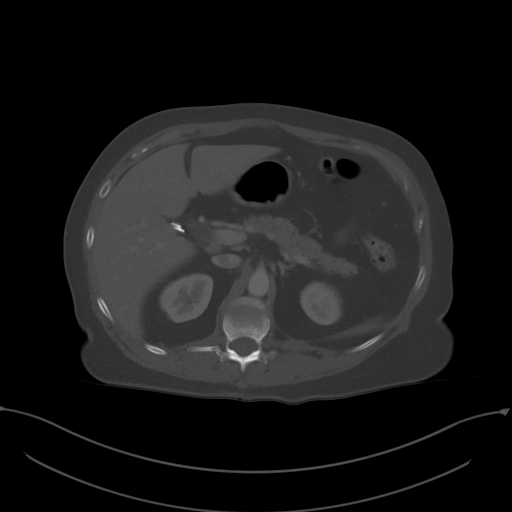
[im 72/87  soft-tissue]
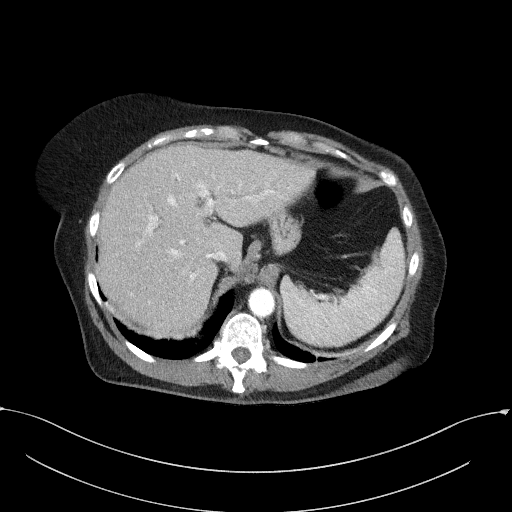
[im 79/87  soft-tissue]
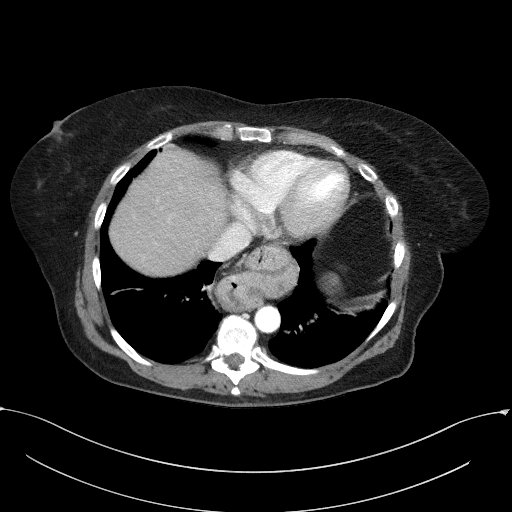

[Series 5: lung bases · axial · 0.83mm/px · z∈[-431,-403]mm · 2 of 93 slices shown]
[im 8/93  bone]
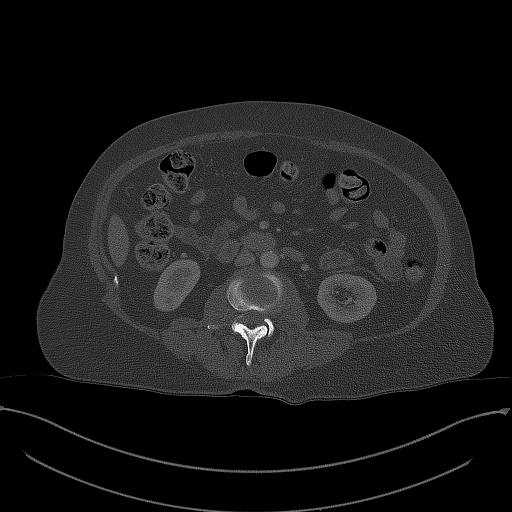
[im 22/93  bone]
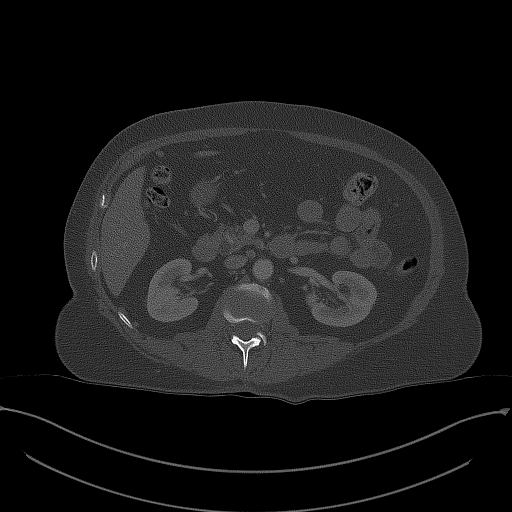

[Series 6: coronal st · coronal · 0.78mm/px · 3 of 93 slices shown]
[im 31/93  soft-tissue]
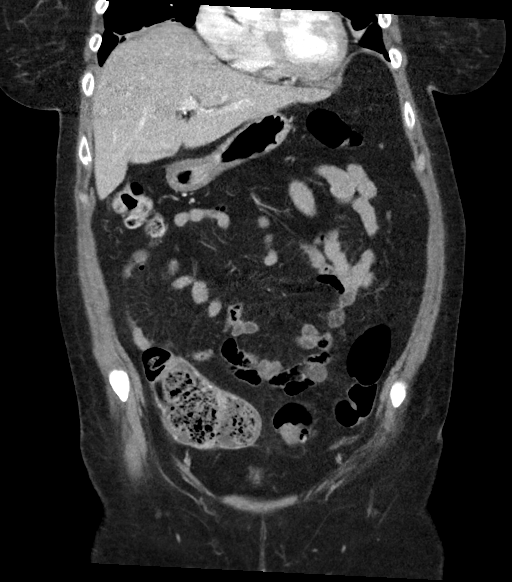
[im 41/93  soft-tissue]
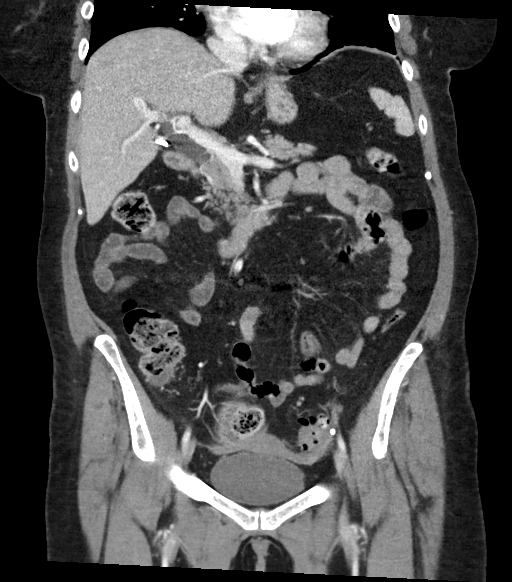
[im 52/93  soft-tissue]
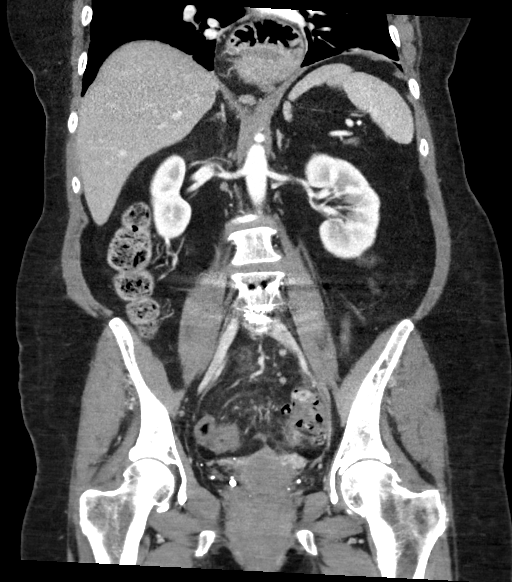

[16 of 46 positions shown; findings below may reference images not displayed]

FINDINGS: Lower chest: Moderately large hiatal hernia. Increased linear
atelectasis/scarring at both lung bases. Normal sized heart.

Hepatobiliary: Mild diffuse low density of the liver relative to the
spleen. Cholecystectomy clips. Stable post cholecystectomy biliary
ductal dilatation.

Pancreas: Mild diffuse pancreatic atrophy.

Spleen: Normal in size without focal abnormality.

Adrenals/Urinary Tract: Adrenal glands are unremarkable. Kidneys are
normal, without renal calculi, focal lesion, or hydronephrosis.
Bladder is unremarkable.

Stomach/Bowel: Diffuse low to medium density wall thickening
involving the distal sigmoid colon in an area of multiple
diverticula. There is associated pericolonic soft tissue stranding.
There is minimal adjacent free peritoneal fluid without abscess
formation.

Normal appearing appendix and small bowel.

Vascular/Lymphatic: Minimal atheromatous aortic calcification. No
aneurysm. No enlarged lymph nodes.

Reproductive: Uterus and bilateral adnexa are unremarkable.

Other: No abdominal wall hernia or abnormality. No abdominopelvic
ascites.

Musculoskeletal: Interbody and pedicle screw and rod fusion at the
L4-5 level with normal alignment. Interval 30% T12 superior endplate
compression deformity and Schmorl's node formation. This is healed
with no acute fracture lines. Associated mild bony retropulsion.

Mild lumbar and lower thoracic spine degenerative changes. Left
ischial bone island.
IMPRESSION: 1. Sigmoid colon diverticulitis without abscess.
2. Mild diffuse hepatic steatosis.
3. Moderately large hiatal hernia.

## 2020-10-22 ENCOUNTER — Telehealth: Payer: Self-pay | Admitting: Physical Medicine and Rehabilitation

## 2020-10-22 NOTE — Telephone Encounter (Signed)
Patient is requesting a repeat left greater trochanteric bursa injection. The last was 05/28/20. Please see Dr. Eliberto Ivory OV note from 7/11 re patient's request for a steroid injection. Please advise.

## 2020-10-22 NOTE — Telephone Encounter (Signed)
Patient called. She would like an appointment with Dr. Alvester Morin. Her call back number is [phone]

## 2020-10-23 NOTE — Telephone Encounter (Signed)
Called patient to advise. She expressed understanding. 

## 2020-10-25 NOTE — Anesthesia Preprocedure Evaluation (Addendum)
Anesthesia Evaluation  Patient identified by MRN, date of birth, ID band Patient awake    Reviewed: Allergy & Precautions, NPO status , Patient's Chart, lab work & pertinent test results  Airway Mallampati: II  TM Distance: >3 FB Neck ROM: Full    Dental  (+) Edentulous Upper, Edentulous Lower   Pulmonary shortness of breath, asthma , sleep apnea , COPD,  Covid-19 Nucleic Acid Test Results Lab Results      Component                Value               Date                      SARSCOV2NAA                                  10/29/2020            RESULT: NEGATIVE      SARSCOV2NAA              NEGATIVE            08/06/2020                SARSCOV2NAA              NEGATIVE            01/27/2020                SARSCOV2NAA              NEGATIVE            11/11/2019                SARSCOV2NAA              NEGATIVE            04/14/2019              breath sounds clear to auscultation       Cardiovascular hypertension, Pt. on medications and Pt. on home beta blockers + Past MI and +CHF  (-) CAD + dysrhythmias Supra Ventricular Tachycardia + Valvular Problems/Murmurs AI  Rhythm:Regular   1. Left ventricular ejection fraction, by visual estimation, is 60 to  65%. The left ventricle has normal function. There is mildly increased  left ventricular hypertrophy.  2. Left ventricular diastolic Doppler parameters are consistent with  impaired relaxation pattern of LV diastolic filling.  3. Global right ventricle has normal systolic function.The right  ventricular size is normal. No increase in right ventricular wall  thickness.  4. Left atrial size was normal.  5. Right atrial size was normal.  6. The mitral valve is normal in structure. No evidence of mitral valve  regurgitation. No evidence of mitral stenosis.  7. The tricuspid valve is normal in structure. Tricuspid valve  regurgitation was not visualized by color flow Doppler.  8.  The aortic valve is tricuspid Aortic valve regurgitation is mild to  moderate by color flow Doppler. Structurally normal aortic valve, with no  evidence of sclerosis or stenosis.  9. The pulmonic valve was not well visualized. Pulmonic valve  regurgitation is not visualized by color flow Doppler.  10. Normal pulmonary artery systolic pressure.   09/2011 Cath Hemodynamic Findings: Central aortic pressure: 108/58  Left ventricular pressure: 107/10/14  Angiographic Findings:  Left main: No obstructive disease  noted.  Left Anterior Descending Artery: Moderate to large sized vessel that courses to the apex. There is a moderate sized diagonal branch. No obstructive disease noted.  Circumflex Artery: Large, dominant artery with moderate sized first obtuse marginal branch and left sided posterolateral branch with no disease noted.  Right Coronary Artery: Small, non-dominant vessel with no disease noted.  Left Ventricular Angiogram:LVEF=65%.  Impression:  1. No angiographic evidence of CAD  2. Normal LV systolic function  3. Non-cardiac chest pain  Recommendations: No further ischemic workup.  Complications: None. The patient tolerated the procedure well.    Neuro/Psych  Headaches, Seizures -,  PSYCHIATRIC DISORDERS Anxiety Depression Bipolar Disorder  Neuromuscular disease    GI/Hepatic Neg liver ROS, hiatal hernia, GERD  ,  Endo/Other  diabetesLab Results      Component                Value               Date                      HGBA1C                   6.0 (H)             10/11/2020             Renal/GU negative Renal ROSLab Results      Component                Value               Date                      CREATININE               0.70                10/11/2020                Musculoskeletal  (+) Arthritis , Fibromyalgia -  Abdominal   Peds  Hematology negative hematology ROS (+) Lab Results      Component                Value               Date                       WBC                      6.9                 10/11/2020                HGB                      15.8 (H)            10/11/2020                HCT                      49.5 (H)            10/11/2020                MCV  97.2                10/11/2020                PLT                      200                 10/11/2020              Anesthesia Other Findings 1. Preoperative clearance Patient has a pending surgery for large hiatal hernia repair with [facility] Dr. Andrey Campanile.  Her revised cardiac risk index = 2 placing her at a perioperative risk of major cardiac event of 6.6%.  Her Duke activity score index is 24.2 giving her a functional capacity and METS of 5.7.  She is cleared to undergo hernia repair under general anesthesia from a cardiac standpoint.  2. Syncope, unspecified syncope type Denies any further syncopal episodes.  Continue Florinef 0.1 mg p.o. every other day.  3. Essential hypertension Blood pressure is well controlled today at 114/68.  Continue hydralazine 25 mg p.o. 3 times daily as needed for severe hypertension systolic number of 170 or greater.  Continue carvedilol 6.25 mg p.o. twice daily.  Lasix 40 mg p.o. daily.  4. Autonomic dysfunction No near syncopal or syncopal episodes since starting Florinef.  Continue Florinef 0.1 mg p.o. every other day.  5.  Hyperlipidemia. Continue continue Crestor 10 mg daily.   Medication Adjustments/Labs and Tests Ordered: Current medicines are reviewed at length with the patient today.  Concerns regarding medicines are outlined above.  Disposition: Follow-up with Dr. Wyline Mood or APP 6 months  Signed, Rennis Harding, NP 09/11/2020 3:13 PM    [facility] at [facility] [address] [place], [place], [place] [address] Phone: [phone]; Fax: [phone]  Reproductive/Obstetrics                           Anesthesia Physical Anesthesia  Plan  ASA: 3  Anesthesia Plan: General   Post-op Pain Management:    Induction: Intravenous  PONV Risk Score and Plan: 3 and Ondansetron, Dexamethasone, Propofol infusion, Amisulpride and TIVA  Airway Management Planned: Oral ETT  Additional Equipment: None  Intra-op Plan:   Post-operative Plan: Extubation in OR  Informed Consent: I have reviewed the patients History and Physical, chart, labs and discussed the procedure including the risks, benefits and alternatives for the proposed anesthesia with the patient or authorized representative who has indicated his/her understanding and acceptance.     Dental advisory given  Plan Discussed with: CRNA and Anesthesiologist  Anesthesia Plan Comments: (See PAT note 10/11/2020, Jodell Cipro, PA-C)       Anesthesia Quick Evaluation

## 2020-10-25 NOTE — Progress Notes (Signed)
Anesthesia Chart Review   Case: [id number] Date/Time: 10/31/20 0815   Procedures:      XI ROBOTIC ASSISTED HIATAL HERNIA REPAIR WITH PARTIAL FUNDOPLICATION     ESOPHAGOGASTRODUODENOSCOPY (EGD)   Anesthesia type: General   Pre-op diagnosis: large hiatal hernia, gerd   Location: [facility] ROOM 02 / [facility]   Surgeons: Gaynelle Adu, MD       DISCUSSION:65 y.o. never smoker with h/o HTN, GERD, asthma, CAD, CHF, sleep apnea no CPAP, COPD, DM II, large hiatal hernia scheduled for above procedure 10/31/2020 with Dr. Gaynelle Adu.   Pt last seen by cardiology 09/11/2020. Per OV note, "Patient has a pending surgery for large hiatal hernia repair with [facility] Dr. Andrey Campanile.  Her revised cardiac risk index = 2 placing her at a perioperative risk of major cardiac event of 6.6%.  Her Duke activity score index is 24.2 giving her a functional capacity and METS of 5.7.  She is cleared to undergo hernia repair under general anesthesia from a cardiac standpoint."  Anticipate pt can proceed with planned procedure barring acute status change.   VS: BP 117/63   Pulse 66   Temp 36.7 C (Oral)   Ht 5\' 2"  (1.575 m)   Wt 61.2 kg   SpO2 96%   BMI 24.69 kg/m   PROVIDERS: , FNP is PCP   Bennie Pierini, MD is Cardiologist  LABS: Labs reviewed: Acceptable for surgery. (all labs ordered are listed, but only abnormal results are displayed)  Labs Reviewed  CBC WITH DIFFERENTIAL/PLATELET - Abnormal; Notable for the following components:      Result Value   Hemoglobin 15.8 (*)    HCT 49.5 (*)    All other components within normal limits  HEMOGLOBIN A1C - Abnormal; Notable for the following components:   Hgb A1c MFr Bld 6.0 (*)    All other components within normal limits  COMPREHENSIVE METABOLIC PANEL  GLUCOSE, CAPILLARY     IMAGES:   EKG: 09/12/2020 Rate 72 bpm  NSR Minimal voltage criteria for LVH, may be normal variant  CV: Echo 01/18/2019  1. Left  ventricular ejection fraction, by visual estimation, is 60 to  65%. The left ventricle has normal function. There is mildly increased  left ventricular hypertrophy.   2. Left ventricular diastolic Doppler parameters are consistent with  impaired relaxation pattern of LV diastolic filling.   3. Global right ventricle has normal systolic function.The right  ventricular size is normal. No increase in right ventricular wall  thickness.   4. Left atrial size was normal.   5. Right atrial size was normal.   6. The mitral valve is normal in structure. No evidence of mitral valve  regurgitation. No evidence of mitral stenosis.   7. The tricuspid valve is normal in structure. Tricuspid valve  regurgitation was not visualized by color flow Doppler.   8. The aortic valve is tricuspid Aortic valve regurgitation is mild to  moderate by color flow Doppler. Structurally normal aortic valve, with no  evidence of sclerosis or stenosis.   9. The pulmonic valve was not well visualized. Pulmonic valve  regurgitation is not visualized by color flow Doppler.  10. Normal pulmonary artery systolic pressure.  Past Medical History:  Diagnosis Date   Allergy    Anemia    Anginal pain (HCC)    last time    Anxiety    Arthritis    RHEUMATOID   Asthma    Bipolar 1 disorder (HCC)  Blood transfusion without reported diagnosis    Cataracts, bilateral 07/2017   CHF (congestive heart failure) (HCC)    COPD (chronic obstructive pulmonary disease) (HCC)    Coronary artery disease    reported hx of "MI";  Echo 2009 with normal LVF;  Myoview 05/2011: no ischemia   Depression    Diabetes mellitus without complication (HCC)    Dyslipidemia    Dysrhythmia    SVT   Esophageal stricture    Fibromyalgia    GERD (gastroesophageal reflux disease)    H/O hiatal hernia    Head injury, unspecified    Headache    migraines   Herpes simplex infection    History of kidney stones    History of loop recorder     Managed by Dr. Sharrell Ku   Hyperlipidemia    Hypertension    Insomnia    Myocardial infarction [facility])    age 36   Osteoporosis    Pneumonia    hx   Seizures (HCC)    Shortness of breath    Sleep apnea    ? neg   Spinal stenosis of lumbar region    Spondylolisthesis    Status post placement of implantable loop recorder    Supraventricular tachycardia (HCC)    Syncope and collapse    s/p ILR; no arhythmogenic cause identified   UTI (lower urinary tract infection)     Past Surgical History:  Procedure Laterality Date   BACK SURGERY     BREAST SURGERY     lumpectomy   CARDIAC CATHETERIZATION  10/06/2011   CATARACT EXTRACTION W/PHACO Right 07/31/2017   Procedure: CATARACT EXTRACTION PHACO AND INTRAOCULAR LENS PLACEMENT (IOC);  Surgeon: Fabio Pierce, MD;  Location: [facility];  Service: Ophthalmology;  Laterality: Right;  CDE: 2.33   CATARACT EXTRACTION W/PHACO Left 08/14/2017   Procedure: CATARACT EXTRACTION PHACO AND INTRAOCULAR LENS PLACEMENT (IOC);  Surgeon: Fabio Pierce, MD;  Location: [facility];  Service: Ophthalmology;  Laterality: Left;  CDE: 2.74   CHOLECYSTECTOMY     CYSTOSCOPY     stone   DIAGNOSTIC LAPAROSCOPY     laparoscopic cholecystectomy   DOPPLER ECHOCARDIOGRAPHY  2009   head up tilt table testing  06/15/2007   [name]   HEMORRHOID SURGERY     insertion of implatable loop recorder  08/11/2007   [name]   POSTERIOR CERVICAL FUSION/FORAMINOTOMY N/A 12/19/2013   Procedure: RIGHT C3-4.C4-5 AND C5-6 FORAMINOTOMIES;  Surgeon: Kerrin Champagne, MD;  Location: [facility] OR;  Service: Orthopedics;  Laterality: N/A;   TOTAL HIP ARTHROPLASTY Left 01/31/2020   Procedure: LEFT TOTAL HIP ARTHROPLASTY ANTERIOR APPROACH;  Surgeon: Kathryne Hitch, MD;  Location: [facility];  Service: Orthopedics;  Laterality: Left;   TOTAL SHOULDER ARTHROPLASTY Right 11/15/2019   Procedure: RIGHT TOTAL SHOULDER ARTHROPLASTY;  Surgeon: Cammy Copa, MD;  Location: [facility];  Service:  Orthopedics;  Laterality: Right;   TUBAL LIGATION     UPPER GASTROINTESTINAL ENDOSCOPY      MEDICATIONS:  acyclovir ointment (ZOVIRAX) 5 %   albuterol (PROVENTIL) (2.5 MG/3ML) 0.083% nebulizer solution   albuterol (VENTOLIN HFA) 108 (90 Base) MCG/ACT inhaler   Alcohol Swabs (GLOBAL ALCOHOL PREP EASE) 70 % PADS   busPIRone (BUSPAR) 10 MG tablet   butalbital-acetaminophen-caffeine (FIORICET) 50-325-40 MG tablet   carvedilol (COREG) 6.25 MG tablet   celecoxib (CELEBREX) 200 MG capsule   cephALEXin (KEFLEX) 500 MG capsule   dexlansoprazole (DEXILANT) 60 MG capsule   diclofenac Sodium (VOLTAREN) 1 %  GEL   DULoxetine (CYMBALTA) 60 MG capsule   empagliflozin (JARDIANCE) 25 MG TABS tablet   escitalopram (LEXAPRO) 20 MG tablet   estradiol (ESTRACE) 0.1 MG/GM vaginal cream   FIBER PO   fludrocortisone (FLORINEF) 0.1 MG tablet   fluticasone (FLONASE) 50 MCG/ACT nasal spray   fluticasone furoate-vilanterol (BREO ELLIPTA) 200-25 MCG/INH AEPB   furosemide (LASIX) 40 MG tablet   glucose blood (ONETOUCH VERIO) test strip   hydrALAZINE (APRESOLINE) 25 MG tablet   [START ON 11/27/2020] HYDROcodone-acetaminophen (NORCO) 7.5-325 MG tablet   [START ON 10/28/2020] HYDROcodone-acetaminophen (NORCO) 7.5-325 MG tablet   HYDROcodone-acetaminophen (NORCO) 7.5-325 MG tablet   ipratropium (ATROVENT) 0.02 % nebulizer solution   isosorbide mononitrate (IMDUR) 30 MG 24 hr tablet   lamoTRIgine (LAMICTAL) 150 MG tablet   Lancets (ONETOUCH DELICA PLUS LANCET33G) MISC   levETIRAcetam (KEPPRA) 500 MG tablet   levocetirizine (XYZAL) 5 MG tablet   lidocaine (XYLOCAINE) 5 % ointment   methocarbamol (ROBAXIN) 500 MG tablet   mupirocin ointment (BACTROBAN) 2 %   nitroGLYCERIN (NITROSTAT) 0.4 MG SL tablet   ondansetron (ZOFRAN) 4 MG tablet   oxybutynin (DITROPAN-XL) 5 MG 24 hr tablet   rosuvastatin (CRESTOR) 10 MG tablet   sucralfate (CARAFATE) 1 g tablet   tiZANidine (ZANAFLEX) 4 MG tablet   traZODone (DESYREL)  150 MG tablet   valACYclovir (VALTREX) 1000 MG tablet   valACYclovir (VALTREX) 500 MG tablet   No current facility-administered medications for this encounter.    Jodell Cipro, PA-C [facility] [phone]

## 2020-10-29 ENCOUNTER — Other Ambulatory Visit: Payer: Self-pay | Admitting: Nurse Practitioner

## 2020-10-29 ENCOUNTER — Other Ambulatory Visit: Payer: Self-pay | Admitting: Cardiology

## 2020-10-29 ENCOUNTER — Other Ambulatory Visit: Payer: Self-pay | Admitting: General Surgery

## 2020-10-29 ENCOUNTER — Other Ambulatory Visit: Payer: Self-pay | Admitting: Orthopaedic Surgery

## 2020-10-29 DIAGNOSIS — B009 Herpesviral infection, unspecified: Secondary | ICD-10-CM

## 2020-10-29 DIAGNOSIS — G44229 Chronic tension-type headache, not intractable: Secondary | ICD-10-CM

## 2020-10-29 DIAGNOSIS — F411 Generalized anxiety disorder: Secondary | ICD-10-CM

## 2020-10-29 DIAGNOSIS — A6004 Herpesviral vulvovaginitis: Secondary | ICD-10-CM

## 2020-10-29 DIAGNOSIS — R569 Unspecified convulsions: Secondary | ICD-10-CM

## 2020-10-29 DIAGNOSIS — I10 Essential (primary) hypertension: Secondary | ICD-10-CM

## 2020-10-30 LAB — SARS CORONAVIRUS 2 (TAT 6-24 HRS): SARS Coronavirus 2: NEGATIVE

## 2020-10-30 MED ORDER — BUPIVACAINE LIPOSOME 1.3 % IJ SUSP
20.0000 mL | Freq: Once | INTRAMUSCULAR | Status: DC
  Filled 2020-10-30: qty 20

## 2020-10-31 ENCOUNTER — Other Ambulatory Visit: Payer: Self-pay

## 2020-10-31 ENCOUNTER — Telehealth: Payer: Self-pay | Admitting: Nurse Practitioner

## 2020-10-31 ENCOUNTER — Ambulatory Visit: Payer: 59 | Admitting: Orthopaedic Surgery

## 2020-10-31 ENCOUNTER — Ambulatory Visit (HOSPITAL_COMMUNITY): Payer: 59 | Admitting: Certified Registered Nurse Anesthetist

## 2020-10-31 ENCOUNTER — Ambulatory Visit (HOSPITAL_COMMUNITY): Payer: 59 | Admitting: Physician Assistant

## 2020-10-31 ENCOUNTER — Encounter (HOSPITAL_COMMUNITY)
Admission: RE | Disposition: A | Discharge status: Home / Self Care | Payer: Self-pay | Source: Ambulatory Visit | Attending: General Surgery

## 2020-10-31 ENCOUNTER — Encounter (HOSPITAL_COMMUNITY): Payer: Self-pay | Admitting: General Surgery

## 2020-10-31 ENCOUNTER — Observation Stay (HOSPITAL_COMMUNITY)
Admission: RE | Admit: 2020-10-31 | Discharge: 2020-11-01 | Disposition: A | Discharge status: Home / Self Care | Payer: 59 | Source: Ambulatory Visit | Attending: General Surgery | Admitting: General Surgery

## 2020-10-31 DIAGNOSIS — Z96611 Presence of right artificial shoulder joint: Secondary | ICD-10-CM | POA: Insufficient documentation

## 2020-10-31 DIAGNOSIS — I110 Hypertensive heart disease with heart failure: Secondary | ICD-10-CM | POA: Diagnosis not present

## 2020-10-31 DIAGNOSIS — K449 Diaphragmatic hernia without obstruction or gangrene: Secondary | ICD-10-CM | POA: Diagnosis present

## 2020-10-31 DIAGNOSIS — Z9889 Other specified postprocedural states: Secondary | ICD-10-CM

## 2020-10-31 DIAGNOSIS — G894 Chronic pain syndrome: Secondary | ICD-10-CM | POA: Diagnosis not present

## 2020-10-31 DIAGNOSIS — I2510 Atherosclerotic heart disease of native coronary artery without angina pectoris: Secondary | ICD-10-CM | POA: Diagnosis not present

## 2020-10-31 DIAGNOSIS — E119 Type 2 diabetes mellitus without complications: Secondary | ICD-10-CM | POA: Diagnosis not present

## 2020-10-31 DIAGNOSIS — J449 Chronic obstructive pulmonary disease, unspecified: Secondary | ICD-10-CM | POA: Insufficient documentation

## 2020-10-31 DIAGNOSIS — K219 Gastro-esophageal reflux disease without esophagitis: Secondary | ICD-10-CM | POA: Diagnosis not present

## 2020-10-31 DIAGNOSIS — J45909 Unspecified asthma, uncomplicated: Secondary | ICD-10-CM | POA: Diagnosis not present

## 2020-10-31 DIAGNOSIS — N941 Unspecified dyspareunia: Secondary | ICD-10-CM

## 2020-10-31 DIAGNOSIS — Z96642 Presence of left artificial hip joint: Secondary | ICD-10-CM | POA: Diagnosis not present

## 2020-10-31 DIAGNOSIS — I509 Heart failure, unspecified: Secondary | ICD-10-CM | POA: Insufficient documentation

## 2020-10-31 DIAGNOSIS — I252 Old myocardial infarction: Secondary | ICD-10-CM | POA: Insufficient documentation

## 2020-10-31 HISTORY — PX: XI ROBOTIC ASSISTED HIATAL HERNIA REPAIR: SHX6889

## 2020-10-31 HISTORY — PX: ESOPHAGOGASTRODUODENOSCOPY: SHX5428

## 2020-10-31 LAB — GLUCOSE, CAPILLARY
Glucose-Capillary: 91 mg/dL (ref 70–99)
Glucose-Capillary: 107 mg/dL — ABNORMAL HIGH (ref 70–99)
Glucose-Capillary: 143 mg/dL — ABNORMAL HIGH (ref 70–99)

## 2020-10-31 LAB — TYPE AND SCREEN
ABO/RH(D): A POS
Antibody Screen: NEGATIVE

## 2020-10-31 SURGERY — REPAIR, HERNIA, HIATAL, ROBOT-ASSISTED
Anesthesia: General | Site: Esophagus

## 2020-10-31 MED ORDER — ONDANSETRON 4 MG PO TBDP: 4.0000 mg | ORAL_TABLET | Freq: Four times a day (QID) | ORAL | Status: DC | PRN

## 2020-10-31 MED ORDER — SIMETHICONE 80 MG PO CHEW: 40.0000 mg | CHEWABLE_TABLET | Freq: Four times a day (QID) | ORAL | Status: DC | PRN

## 2020-10-31 MED ORDER — BUTALBITAL-APAP-CAFFEINE 50-325-40 MG PO TABS: 1.0000 | ORAL_TABLET | Freq: Four times a day (QID) | ORAL | Status: DC | PRN

## 2020-10-31 MED ORDER — PHENYLEPHRINE 40 MCG/ML (10ML) SYRINGE FOR IV PUSH (FOR BLOOD PRESSURE SUPPORT)
PREFILLED_SYRINGE | INTRAVENOUS | Status: AC
  Filled 2020-10-31: qty 30

## 2020-10-31 MED ORDER — METHOCARBAMOL 500 MG PO TABS
500.0000 mg | ORAL_TABLET | Freq: Four times a day (QID) | ORAL | Status: DC | PRN
  Administered 2020-10-31 – 2020-11-01 (×2): 500 mg via ORAL
  Filled 2020-10-31 (×2): qty 1

## 2020-10-31 MED ORDER — ONDANSETRON HCL 4 MG/2ML IJ SOLN
INTRAMUSCULAR | Status: DC | PRN
  Administered 2020-10-31: 4 mg via INTRAVENOUS

## 2020-10-31 MED ORDER — ENOXAPARIN SODIUM 40 MG/0.4ML IJ SOSY
40.0000 mg | PREFILLED_SYRINGE | INTRAMUSCULAR | Status: DC
  Administered 2020-11-01: 40 mg via SUBCUTANEOUS
  Filled 2020-10-31: qty 0.4

## 2020-10-31 MED ORDER — FENTANYL CITRATE (PF) 100 MCG/2ML IJ SOLN: 25.0000 ug | INTRAMUSCULAR | Status: DC | PRN

## 2020-10-31 MED ORDER — ACETAMINOPHEN 10 MG/ML IV SOLN: 1000.0000 mg | Freq: Once | INTRAVENOUS | Status: DC | PRN

## 2020-10-31 MED ORDER — SUGAMMADEX SODIUM 200 MG/2ML IV SOLN
INTRAVENOUS | Status: DC | PRN
  Administered 2020-10-31: 150 mg via INTRAVENOUS

## 2020-10-31 MED ORDER — MIDAZOLAM HCL 5 MG/5ML IJ SOLN
INTRAMUSCULAR | Status: DC | PRN
  Administered 2020-10-31: 2 mg via INTRAVENOUS

## 2020-10-31 MED ORDER — ONDANSETRON HCL 4 MG/2ML IJ SOLN: 4.0000 mg | Freq: Four times a day (QID) | INTRAMUSCULAR | Status: DC | PRN

## 2020-10-31 MED ORDER — ORAL CARE MOUTH RINSE: 15.0000 mL | Freq: Once | OROMUCOSAL | Status: AC

## 2020-10-31 MED ORDER — DIAZEPAM 5 MG PO TABS
5.0000 mg | ORAL_TABLET | Freq: Three times a day (TID) | ORAL | Status: DC | PRN
  Administered 2020-10-31: 5 mg via ORAL
  Filled 2020-10-31: qty 1

## 2020-10-31 MED ORDER — PANTOPRAZOLE SODIUM 40 MG IV SOLR
40.0000 mg | Freq: Every day | INTRAVENOUS | Status: DC
  Administered 2020-10-31: 40 mg via INTRAVENOUS
  Filled 2020-10-31: qty 40

## 2020-10-31 MED ORDER — IPRATROPIUM BROMIDE 0.02 % IN SOLN: 3.0000 mL | Freq: Four times a day (QID) | RESPIRATORY_TRACT | Status: DC | PRN

## 2020-10-31 MED ORDER — FENTANYL CITRATE (PF) 100 MCG/2ML IJ SOLN
INTRAMUSCULAR | Status: AC
  Filled 2020-10-31: qty 2

## 2020-10-31 MED ORDER — PROPOFOL 10 MG/ML IV BOLUS
INTRAVENOUS | Status: DC | PRN
  Administered 2020-10-31: 90 mg via INTRAVENOUS
  Administered 2020-10-31: 50 mg via INTRAVENOUS

## 2020-10-31 MED ORDER — LACTATED RINGERS IV SOLN: INTRAVENOUS | Status: DC | PRN

## 2020-10-31 MED ORDER — FENTANYL CITRATE (PF) 100 MCG/2ML IJ SOLN
25.0000 ug | INTRAMUSCULAR | Status: DC | PRN
  Administered 2020-10-31: 50 ug via INTRAVENOUS

## 2020-10-31 MED ORDER — HEPARIN SODIUM (PORCINE) 5000 UNIT/ML IJ SOLN
5000.0000 [IU] | Freq: Once | INTRAMUSCULAR | Status: AC
  Administered 2020-10-31: 5000 [IU] via SUBCUTANEOUS
  Filled 2020-10-31: qty 1

## 2020-10-31 MED ORDER — DULOXETINE HCL 60 MG PO CPEP
60.0000 mg | ORAL_CAPSULE | Freq: Every day | ORAL | Status: DC
  Administered 2020-10-31: 60 mg via ORAL
  Filled 2020-10-31: qty 1

## 2020-10-31 MED ORDER — HYDRALAZINE HCL 20 MG/ML IJ SOLN: 20.0000 mg | INTRAMUSCULAR | Status: DC | PRN

## 2020-10-31 MED ORDER — ENSURE PRE-SURGERY PO LIQD
296.0000 mL | Freq: Once | ORAL | Status: DC
Start: 2020-11-01 — End: 2020-10-31
  Filled 2020-10-31: qty 296

## 2020-10-31 MED ORDER — INSULIN ASPART 100 UNIT/ML IJ SOLN
0.0000 [IU] | INTRAMUSCULAR | Status: DC
  Administered 2020-10-31: 2 [IU] via SUBCUTANEOUS

## 2020-10-31 MED ORDER — ISOSORBIDE MONONITRATE ER 30 MG PO TB24
30.0000 mg | ORAL_TABLET | Freq: Every day | ORAL | Status: DC
  Administered 2020-10-31 – 2020-11-01 (×2): 30 mg via ORAL
  Filled 2020-10-31 (×2): qty 1

## 2020-10-31 MED ORDER — ONDANSETRON HCL 4 MG/2ML IJ SOLN
INTRAMUSCULAR | Status: AC
  Filled 2020-10-31: qty 2

## 2020-10-31 MED ORDER — KETAMINE HCL 10 MG/ML IJ SOLN
INTRAMUSCULAR | Status: AC
  Filled 2020-10-31: qty 1

## 2020-10-31 MED ORDER — DEXAMETHASONE SODIUM PHOSPHATE 10 MG/ML IJ SOLN
INTRAMUSCULAR | Status: DC | PRN
  Administered 2020-10-31: 5 mg via INTRAVENOUS

## 2020-10-31 MED ORDER — KETOROLAC TROMETHAMINE 15 MG/ML IJ SOLN
INTRAMUSCULAR | Status: DC | PRN
  Administered 2020-10-31: 15 mg via INTRAVENOUS

## 2020-10-31 MED ORDER — CHLORHEXIDINE GLUCONATE CLOTH 2 % EX PADS
6.0000 | MEDICATED_PAD | Freq: Once | CUTANEOUS | Status: DC
Start: 2020-10-31 — End: 2020-10-31

## 2020-10-31 MED ORDER — LEVETIRACETAM 100 MG/ML PO SOLN
500.0000 mg | Freq: Two times a day (BID) | ORAL | Status: DC
  Administered 2020-10-31 – 2020-11-01 (×2): 500 mg via ORAL
  Filled 2020-10-31 (×2): qty 5

## 2020-10-31 MED ORDER — LIDOCAINE 2% (20 MG/ML) 5 ML SYRINGE
INTRAMUSCULAR | Status: AC
  Filled 2020-10-31: qty 5

## 2020-10-31 MED ORDER — ALBUTEROL SULFATE (2.5 MG/3ML) 0.083% IN NEBU: 3.0000 mL | INHALATION_SOLUTION | Freq: Four times a day (QID) | RESPIRATORY_TRACT | Status: DC | PRN

## 2020-10-31 MED ORDER — CHLORHEXIDINE GLUCONATE CLOTH 2 % EX PADS: 6.0000 | MEDICATED_PAD | Freq: Once | CUTANEOUS | Status: DC

## 2020-10-31 MED ORDER — CEFAZOLIN SODIUM-DEXTROSE 2-4 GM/100ML-% IV SOLN
2.0000 g | INTRAVENOUS | Status: AC
  Administered 2020-10-31: 2 g via INTRAVENOUS
  Filled 2020-10-31: qty 100

## 2020-10-31 MED ORDER — LACTATED RINGERS IR SOLN
Status: DC | PRN
  Administered 2020-10-31: 1000 mL

## 2020-10-31 MED ORDER — DEXAMETHASONE SODIUM PHOSPHATE 4 MG/ML IJ SOLN: 4.0000 mg | INTRAMUSCULAR | Status: DC

## 2020-10-31 MED ORDER — CHLORHEXIDINE GLUCONATE 0.12 % MT SOLN
15.0000 mL | Freq: Once | OROMUCOSAL | Status: AC
  Administered 2020-10-31: 15 mL via OROMUCOSAL

## 2020-10-31 MED ORDER — LAMOTRIGINE 25 MG PO TABS
150.0000 mg | ORAL_TABLET | Freq: Every day | ORAL | Status: DC
  Administered 2020-11-01: 150 mg via ORAL
  Filled 2020-10-31: qty 2

## 2020-10-31 MED ORDER — MIDAZOLAM HCL 2 MG/2ML IJ SOLN
INTRAMUSCULAR | Status: AC
  Filled 2020-10-31: qty 2

## 2020-10-31 MED ORDER — FENTANYL CITRATE (PF) 250 MCG/5ML IJ SOLN
INTRAMUSCULAR | Status: AC
  Filled 2020-10-31: qty 5

## 2020-10-31 MED ORDER — OXYCODONE HCL 5 MG PO TABS
5.0000 mg | ORAL_TABLET | ORAL | Status: DC | PRN
  Administered 2020-10-31: 10 mg via ORAL
  Administered 2020-10-31: 5 mg via ORAL
  Administered 2020-11-01 (×2): 10 mg via ORAL
  Filled 2020-10-31 (×3): qty 2
  Filled 2020-10-31: qty 1

## 2020-10-31 MED ORDER — ESCITALOPRAM OXALATE 20 MG PO TABS
20.0000 mg | ORAL_TABLET | Freq: Every day | ORAL | Status: DC
  Administered 2020-11-01: 20 mg via ORAL
  Filled 2020-10-31: qty 1

## 2020-10-31 MED ORDER — BUPIVACAINE LIPOSOME 1.3 % IJ SUSP
INTRAMUSCULAR | Status: DC | PRN
  Administered 2020-10-31: 20 mL

## 2020-10-31 MED ORDER — PROPOFOL 10 MG/ML IV BOLUS
INTRAVENOUS | Status: AC
  Filled 2020-10-31: qty 20

## 2020-10-31 MED ORDER — TRAZODONE HCL 50 MG PO TABS
150.0000 mg | ORAL_TABLET | Freq: Every day | ORAL | Status: DC
  Administered 2020-10-31: 150 mg via ORAL
  Filled 2020-10-31: qty 1

## 2020-10-31 MED ORDER — KETOROLAC TROMETHAMINE 15 MG/ML IJ SOLN
15.0000 mg | Freq: Three times a day (TID) | INTRAMUSCULAR | Status: DC
  Administered 2020-10-31 – 2020-11-01 (×4): 15 mg via INTRAVENOUS
  Filled 2020-10-31 (×4): qty 1

## 2020-10-31 MED ORDER — OXYCODONE HCL 5 MG/5ML PO SOLN: 5.0000 mg | Freq: Once | ORAL | Status: DC | PRN

## 2020-10-31 MED ORDER — LACTATED RINGERS IV SOLN: INTRAVENOUS | Status: DC

## 2020-10-31 MED ORDER — OXYCODONE HCL 5 MG PO TABS
5.0000 mg | ORAL_TABLET | Freq: Once | ORAL | Status: DC | PRN
Start: 2020-10-31 — End: 2020-10-31

## 2020-10-31 MED ORDER — CARVEDILOL 6.25 MG PO TABS
6.2500 mg | ORAL_TABLET | Freq: Two times a day (BID) | ORAL | Status: DC
  Administered 2020-10-31 – 2020-11-01 (×2): 6.25 mg via ORAL
  Filled 2020-10-31 (×2): qty 1

## 2020-10-31 MED ORDER — EPHEDRINE SULFATE-NACL 50-0.9 MG/10ML-% IV SOSY
PREFILLED_SYRINGE | INTRAVENOUS | Status: DC | PRN
  Administered 2020-10-31 (×2): 10 mg via INTRAVENOUS

## 2020-10-31 MED ORDER — POTASSIUM CHLORIDE IN NACL 20-0.45 MEQ/L-% IV SOLN
INTRAVENOUS | Status: DC
  Filled 2020-10-31 (×3): qty 1000

## 2020-10-31 MED ORDER — ACETAMINOPHEN 500 MG PO TABS: 1000.0000 mg | ORAL_TABLET | Freq: Once | ORAL | Status: DC | PRN

## 2020-10-31 MED ORDER — DICLOFENAC SODIUM 1 % EX GEL: 4.0000 g | Freq: Four times a day (QID) | CUTANEOUS | Status: DC | PRN

## 2020-10-31 MED ORDER — ROCURONIUM BROMIDE 10 MG/ML (PF) SYRINGE
PREFILLED_SYRINGE | INTRAVENOUS | Status: DC | PRN
  Administered 2020-10-31: 10 mg via INTRAVENOUS
  Administered 2020-10-31: 20 mg via INTRAVENOUS
  Administered 2020-10-31: 50 mg via INTRAVENOUS
  Administered 2020-10-31: 20 mg via INTRAVENOUS

## 2020-10-31 MED ORDER — ALBUMIN HUMAN 5 % IV SOLN
INTRAVENOUS | Status: AC
  Filled 2020-10-31: qty 250

## 2020-10-31 MED ORDER — ACETAMINOPHEN 160 MG/5ML PO SOLN: 1000.0000 mg | Freq: Once | ORAL | Status: DC | PRN

## 2020-10-31 MED ORDER — AMISULPRIDE (ANTIEMETIC) 5 MG/2ML IV SOLN: 5.0000 mg | Freq: Once | INTRAVENOUS | Status: DC

## 2020-10-31 MED ORDER — PHENYLEPHRINE 40 MCG/ML (10ML) SYRINGE FOR IV PUSH (FOR BLOOD PRESSURE SUPPORT)
PREFILLED_SYRINGE | INTRAVENOUS | Status: DC | PRN
  Administered 2020-10-31 (×2): 80 ug via INTRAVENOUS
  Administered 2020-10-31 (×2): 120 ug via INTRAVENOUS
  Administered 2020-10-31: 80 ug via INTRAVENOUS
  Administered 2020-10-31 (×2): 120 ug via INTRAVENOUS

## 2020-10-31 MED ORDER — ACETAMINOPHEN 500 MG PO TABS
1000.0000 mg | ORAL_TABLET | Freq: Four times a day (QID) | ORAL | Status: DC
  Administered 2020-10-31 – 2020-11-01 (×4): 1000 mg via ORAL
  Filled 2020-10-31 (×4): qty 2

## 2020-10-31 MED ORDER — LIDOCAINE 2% (20 MG/ML) 5 ML SYRINGE
INTRAMUSCULAR | Status: DC | PRN
  Administered 2020-10-31: 1.5 mg/kg/h via INTRAVENOUS
  Administered 2020-10-31: 40 mg via INTRAVENOUS

## 2020-10-31 MED ORDER — FENTANYL CITRATE (PF) 100 MCG/2ML IJ SOLN
INTRAMUSCULAR | Status: DC | PRN
  Administered 2020-10-31: 100 ug via INTRAVENOUS
  Administered 2020-10-31 (×4): 50 ug via INTRAVENOUS

## 2020-10-31 MED ORDER — DIPHENHYDRAMINE HCL 50 MG/ML IJ SOLN: 12.5000 mg | Freq: Four times a day (QID) | INTRAMUSCULAR | Status: DC | PRN

## 2020-10-31 MED ORDER — SODIUM CHLORIDE (PF) 0.9 % IJ SOLN
INTRAMUSCULAR | Status: DC | PRN
  Administered 2020-10-31: 50 mL

## 2020-10-31 MED ORDER — BUPIVACAINE-EPINEPHRINE 0.25% -1:200000 IJ SOLN
INTRAMUSCULAR | Status: AC
  Filled 2020-10-31: qty 1

## 2020-10-31 MED ORDER — SCOPOLAMINE 1 MG/3DAYS TD PT72
1.0000 | MEDICATED_PATCH | TRANSDERMAL | Status: DC
  Administered 2020-10-31: 1.5 mg via TRANSDERMAL
  Filled 2020-10-31: qty 1

## 2020-10-31 MED ORDER — KETAMINE HCL 10 MG/ML IJ SOLN
INTRAMUSCULAR | Status: DC | PRN
  Administered 2020-10-31: 20 mg via INTRAVENOUS

## 2020-10-31 MED ORDER — DIPHENHYDRAMINE HCL 12.5 MG/5ML PO ELIX: 12.5000 mg | ORAL_SOLUTION | Freq: Four times a day (QID) | ORAL | Status: DC | PRN

## 2020-10-31 MED ORDER — FLUTICASONE FUROATE-VILANTEROL 200-25 MCG/INH IN AEPB: 1.0000 | INHALATION_SPRAY | Freq: Every day | RESPIRATORY_TRACT | Status: DC | PRN

## 2020-10-31 MED ORDER — ALBUMIN HUMAN 5 % IV SOLN: INTRAVENOUS | Status: DC | PRN

## 2020-10-31 MED ORDER — ACETAMINOPHEN 500 MG PO TABS
1000.0000 mg | ORAL_TABLET | ORAL | Status: AC
  Administered 2020-10-31: 1000 mg via ORAL
  Filled 2020-10-31: qty 2

## 2020-10-31 SURGICAL SUPPLY — 64 items
ADH SKN CLS APL DERMABOND .7 (GAUZE/BANDAGES/DRESSINGS) ×2
APL PRP STRL LF DISP 70% ISPRP (MISCELLANEOUS) ×2
APPLIER CLIP 5 13 M/L LIGAMAX5 (MISCELLANEOUS)
APPLIER CLIP ROT 10 11.4 M/L (STAPLE)
APR CLP MED LRG 11.4X10 (STAPLE)
APR CLP MED LRG 5 ANG JAW (MISCELLANEOUS)
BAG COUNTER SPONGE SURGICOUNT (BAG) ×2 IMPLANT
BAG SPNG CNTER NS LX DISP (BAG)
BLADE SURG SZ11 CARB STEEL (BLADE) ×3 IMPLANT
CHLORAPREP W/TINT 26 (MISCELLANEOUS) ×3 IMPLANT
CLIP APPLIE 5 13 M/L LIGAMAX5 (MISCELLANEOUS) IMPLANT
CLIP APPLIE ROT 10 11.4 M/L (STAPLE) IMPLANT
COVER SURGICAL LIGHT HANDLE (MISCELLANEOUS) ×3 IMPLANT
COVER TIP SHEARS 8 DVNC (MISCELLANEOUS) IMPLANT
COVER TIP SHEARS 8MM DA VINCI (MISCELLANEOUS)
DECANTER SPIKE VIAL GLASS SM (MISCELLANEOUS) ×2 IMPLANT
DERMABOND ADVANCED (GAUZE/BANDAGES/DRESSINGS) ×1
DERMABOND ADVANCED .7 DNX12 (GAUZE/BANDAGES/DRESSINGS) ×2 IMPLANT
DRAIN PENROSE 0.5X18 (DRAIN) ×1 IMPLANT
DRAPE ARM DVNC X/XI (DISPOSABLE) ×8 IMPLANT
DRAPE COLUMN DVNC XI (DISPOSABLE) ×2 IMPLANT
DRAPE DA VINCI XI ARM (DISPOSABLE) ×12
DRAPE DA VINCI XI COLUMN (DISPOSABLE) ×3
ELECT REM PT RETURN 15FT ADLT (MISCELLANEOUS) ×3 IMPLANT
ENDOLOOP SUT PDS II  0 18 (SUTURE)
ENDOLOOP SUT PDS II 0 18 (SUTURE) IMPLANT
GAUZE 4X4 16PLY ~~LOC~~+RFID DBL (SPONGE) ×3 IMPLANT
GLOVE SRG 8 PF TXTR STRL LF DI (GLOVE) ×2 IMPLANT
GLOVE SURG ENC TEXT LTX SZ8 (GLOVE) ×9 IMPLANT
GLOVE SURG UNDER POLY LF SZ8 (GLOVE) ×3
GOWN STRL REUS W/TWL LRG LVL3 (GOWN DISPOSABLE) ×9 IMPLANT
GOWN STRL REUS W/TWL XL LVL3 (GOWN DISPOSABLE) IMPLANT
IV LACTATED RINGERS 1000ML (IV SOLUTION) ×1 IMPLANT
KIT BASIN OR (CUSTOM PROCEDURE TRAY) ×3 IMPLANT
KIT TURNOVER KIT A (KITS) ×3 IMPLANT
LUBRICANT JELLY K Y 4OZ (MISCELLANEOUS) IMPLANT
MARKER SKIN DUAL TIP RULER LAB (MISCELLANEOUS) ×3 IMPLANT
NDL INSUFFLATION 14GA 120MM (NEEDLE) ×2 IMPLANT
NEEDLE HYPO 22GX1.5 SAFETY (NEEDLE) ×3 IMPLANT
NEEDLE INSUFFLATION 14GA 120MM (NEEDLE) IMPLANT
PACK CARDIOVASCULAR III (CUSTOM PROCEDURE TRAY) ×3 IMPLANT
PAD POSITIONING PINK XL (MISCELLANEOUS) ×3 IMPLANT
SCISSORS LAP 5X35 DISP (ENDOMECHANICALS) IMPLANT
SEAL CANN UNIV 5-8 DVNC XI (MISCELLANEOUS) ×8 IMPLANT
SEAL XI 5MM-8MM UNIVERSAL (MISCELLANEOUS) ×12
SEALER VESSEL DA VINCI XI (MISCELLANEOUS) ×3
SEALER VESSEL EXT DVNC XI (MISCELLANEOUS) ×2 IMPLANT
SET IRRIG TUBING LAPAROSCOPIC (IRRIGATION / IRRIGATOR) ×3 IMPLANT
SOL ANTI FOG 6CC (MISCELLANEOUS) ×2 IMPLANT
SOLUTION ANTI FOG 6CC (MISCELLANEOUS) ×1
SOLUTION ELECTROLUBE (MISCELLANEOUS) ×3 IMPLANT
SUT ETHIBOND 0 36 GRN (SUTURE) ×7 IMPLANT
SUT MNCRL AB 4-0 PS2 18 (SUTURE) ×3 IMPLANT
SUT SILK 0 SH 30 (SUTURE) IMPLANT
SUT SILK 2 0 SH (SUTURE) IMPLANT
SYR 20ML LL LF (SYRINGE) ×3 IMPLANT
TIP INNERVISION DETACH 40FR (MISCELLANEOUS) IMPLANT
TIP INNERVISION DETACH 50FR (MISCELLANEOUS) IMPLANT
TIP INNERVISION DETACH 56FR (MISCELLANEOUS) IMPLANT
TIPS INNERVISION DETACH 40FR (MISCELLANEOUS)
TOWEL OR 17X26 10 PK STRL BLUE (TOWEL DISPOSABLE) ×3 IMPLANT
TOWEL OR NON WOVEN STRL DISP B (DISPOSABLE) ×3 IMPLANT
TROCAR ADV FIXATION 5X100MM (TROCAR) ×3 IMPLANT
TUBING INSUFFLATION 10FT LAP (TUBING) ×3 IMPLANT

## 2020-10-31 NOTE — H&P (Signed)
CC: here for surgery  Requesting provider: Dr Myrtie Neither  HPI: [name] is an 65 y.o. female who is here for robotic repair of hiatal hernia with partial fundoplication and upper endoscopy.  Seen in the clinic in late May.  She describes her reflux as a burning sensation in her throat and sometimes it feels like acid comes up.  Occasionally she will have a gagging sensation.  She states that she will throw up acid.  She also states that sometimes hard to swallow pills.  She does not have pain with liquids or solids.  However she does have a sensation that sometimes she will get a sensation of liquids and solids sticking.  This happens a couple times a week.  She has had anemia in the past and is required iron as well as blood infusions she states.  She is currently taking Dexilant and Carafate.  She does have some epigastric pressure if she bends forward.  She reports that her heartburn is currently relatively well managed with her current regimen but the sensation of things getting stuck as well as a pressure sensation really has not improved with heartburn medications.  She underwent an upper endoscopy which showed a 8 to 10 cm hiatal hernia with no evidence of Barrett's, esophagitis or stricture.  She had multiple erosions consistent with recent stigmata of recent bleeding.  The esophagus was tortuous.  They attempted to do manometry but could not pass the catheter.  She reports that she has had 2 MIs.  She has severe MS and takes Norco 3 times a day.  She takes Keppra for seizures.  She did undergo cardiac evaluation for the surgery and was found to have a major cardiac risk profile of around 6.6%.  I discussed this with the patient. Past Medical History:  Diagnosis Date   Allergy    Anemia    Anginal pain (HCC)    last time    Anxiety    Arthritis    RHEUMATOID   Asthma    Bipolar 1 disorder (HCC)    Blood transfusion without reported diagnosis    Cataracts, bilateral 07/2017    CHF (congestive heart failure) (HCC)    COPD (chronic obstructive pulmonary disease) (HCC)    Coronary artery disease    reported hx of "MI";  Echo 2009 with normal LVF;  Myoview 05/2011: no ischemia   Depression    Diabetes mellitus without complication (HCC)    Dyslipidemia    Dysrhythmia    SVT   Esophageal stricture    Fibromyalgia    GERD (gastroesophageal reflux disease)    H/O hiatal hernia    Head injury, unspecified    Headache    migraines   Herpes simplex infection    History of kidney stones    History of loop recorder    Managed by Dr. Sharrell Ku   Hyperlipidemia    Hypertension    Insomnia    Myocardial infarction [facility])    age 78   Osteoporosis    Pneumonia    hx   Seizures (HCC)    Shortness of breath    Sleep apnea    ? neg   Spinal stenosis of lumbar region    Spondylolisthesis    Status post placement of implantable loop recorder    Supraventricular tachycardia (HCC)    Syncope and collapse    s/p ILR; no arhythmogenic cause identified   UTI (lower urinary tract infection)     Past  Surgical History:  Procedure Laterality Date   BACK SURGERY     BREAST SURGERY     lumpectomy   CARDIAC CATHETERIZATION  10/06/2011   CATARACT EXTRACTION W/PHACO Right 07/31/2017   Procedure: CATARACT EXTRACTION PHACO AND INTRAOCULAR LENS PLACEMENT (IOC);  Surgeon: Fabio Pierce, MD;  Location: [facility];  Service: Ophthalmology;  Laterality: Right;  CDE: 2.33   CATARACT EXTRACTION W/PHACO Left 08/14/2017   Procedure: CATARACT EXTRACTION PHACO AND INTRAOCULAR LENS PLACEMENT (IOC);  Surgeon: Fabio Pierce, MD;  Location: [facility];  Service: Ophthalmology;  Laterality: Left;  CDE: 2.74   CHOLECYSTECTOMY     CYSTOSCOPY     stone   DIAGNOSTIC LAPAROSCOPY     laparoscopic cholecystectomy   DOPPLER ECHOCARDIOGRAPHY  2009   head up tilt table testing  06/15/2007   [name]   HEMORRHOID SURGERY     insertion of implatable loop recorder  08/11/2007   [name]    POSTERIOR CERVICAL FUSION/FORAMINOTOMY N/A 12/19/2013   Procedure: RIGHT C3-4.C4-5 AND C5-6 FORAMINOTOMIES;  Surgeon: Kerrin Champagne, MD;  Location: [facility] OR;  Service: Orthopedics;  Laterality: N/A;   TOTAL HIP ARTHROPLASTY Left 01/31/2020   Procedure: LEFT TOTAL HIP ARTHROPLASTY ANTERIOR APPROACH;  Surgeon: Kathryne Hitch, MD;  Location: [facility];  Service: Orthopedics;  Laterality: Left;   TOTAL SHOULDER ARTHROPLASTY Right 11/15/2019   Procedure: RIGHT TOTAL SHOULDER ARTHROPLASTY;  Surgeon: Cammy Copa, MD;  Location: [facility];  Service: Orthopedics;  Laterality: Right;   TUBAL LIGATION     UPPER GASTROINTESTINAL ENDOSCOPY      Family History  Problem Relation Age of Onset   Heart attack Father    Mental illness Father    Mental illness Mother    Heart attack Brother        stents   Alcohol abuse Brother    Heart disease Brother    Drug abuse Brother    Diabetes Brother    Colon cancer Maternal Aunt    Cirrhosis Brother    Stomach cancer Neg Hx    Esophageal cancer Neg Hx    Rectal cancer Neg Hx     Social:  reports that she has never smoked. She has never used smokeless tobacco. She reports that she does not drink alcohol and does not use drugs.  Allergies:  Allergies  Allergen Reactions   Codeine Other (See Comments)    "I will have a heart attack."   Morphine And Related Other (See Comments)    "It will cause me to have a heart attack."   Ambien [Zolpidem Tartrate] Nausea And Vomiting   Clonidine Derivatives Nausea And Vomiting    gerd - caused acid reflux per pt   Metformin And Related     Nausea/vomiting/cramping from metformin   Lyrica [Pregabalin] Swelling and Other (See Comments)    Weight gain   Neurontin [Gabapentin] Other (See Comments)    Causes elevated LFTs     Medications: I have reviewed the patient's current medications.   ROS - all of the below systems have been reviewed with the patient and positives are indicated with bold text General:  chills, fever or night sweats Eyes: blurry vision or double vision ENT: epistaxis or sore throat Allergy/Immunology: itchy/watery eyes or nasal congestion Hematologic/Lymphatic: bleeding problems, blood clots or swollen lymph nodes Endocrine: temperature intolerance or unexpected weight changes Breast: new or changing breast lumps or nipple discharge Resp: cough, shortness of breath, or wheezing CV: chest pain or dyspnea on exertion GI: as  per HPI GU: dysuria, trouble voiding, or hematuria MSK: joint pain or joint stiffness Neuro: TIA or stroke symptoms Derm: pruritus and skin lesion changes Psych: anxiety and depression  PE Blood pressure (!) 154/93, pulse 70, temperature 98.3 F (36.8 C), temperature source Oral, height 5\' 2"  (1.575 m), weight 61.2 kg, SpO2 93 %. Constitutional: NAD; conversant; no deformities Eyes: Moist conjunctiva; no lid lag; anicteric; PERRL Neck: Trachea midline; no thyromegaly Lungs: Normal respiratory effort; no tactile fremitus CV: RRR; no palpable thrills; no pitting edema GI: Abd soft, nt; no palpable hepatosplenomegaly MSK: Normal gait; no clubbing/cyanosis Psychiatric: Appropriate affect; alert and oriented x3 Lymphatic: No palpable cervical or axillary lymphadenopathy Skin:no rash/jaundice  Results for orders placed or performed during the hospital encounter of 10/31/20 (from the past 48 hour(s))  Glucose, capillary     Status: None   Collection Time: 10/31/20  6:40 AM  Result Value Ref Range   Glucose-Capillary 91 70 - 99 mg/dL    Comment: Glucose reference range applies only to samples taken after fasting for at least 8 hours.   *Note: Due to a large number of results and/or encounters for the requested time period, some results have not been displayed. A complete set of results can be found in Results Review.    No results found.  Imaging: Reviewed upper endoscopy and upper GI  A/P: LAJOYCE TAMURA is an 65 y.o. female with  Large  hiatal hernia with GERD with 77 erosions History of MI MS History of seizures To operating room for robotic repair of hiatal hernia with partial fundoplication, upper endoscopy  IV antibiotic Preoperative E ras medications Preoperative subcu heparin All questions asked and answered [name]. Mary Sella, MD, FACS General, Bariatric, & Minimally Invasive Surgery [facility], [facility]

## 2020-10-31 NOTE — Transfer of Care (Signed)
Immediate Anesthesia Transfer of Care Note  Patient: [name]  Procedure(s) Performed: XI ROBOTIC ASSISTED HIATAL HERNIA REPAIR WITH PARTIAL FUNDOPLICATION (Abdomen) ESOPHAGOGASTRODUODENOSCOPY (EGD) (Esophagus)  Patient Location: PACU  Anesthesia Type:General  Level of Consciousness: awake, alert  and oriented  Airway & Oxygen Therapy: Patient Spontanous Breathing and Patient connected to face mask oxygen  Post-op Assessment: Report given to RN and Post -op Vital signs reviewed and stable  Post vital signs: Reviewed and stable  Last Vitals:  Vitals Value Taken Time  BP 136/116 10/31/20 1211  Temp    Pulse 73 10/31/20 1213  Resp 25 10/31/20 1213  SpO2 100 % 10/31/20 1213  Vitals shown include unvalidated device data.  Last Pain:  Vitals:   10/31/20 0711  TempSrc:   PainSc: 7       Patients Stated Pain Goal: 10 (10/31/20 0711)  Complications: No notable events documented.

## 2020-10-31 NOTE — Brief Op Note (Signed)
10/31/2020  12:37 PM  PATIENT:  [name]  65 y.o. female  PRE-OPERATIVE DIAGNOSIS:  large hiatal hernia, gerd  POST-OPERATIVE DIAGNOSIS:  large hiatal hernia, gerd  PROCEDURE:  Procedure(s): XI ROBOTIC ASSISTED HIATAL HERNIA REPAIR WITH PARTIAL FUNDOPLICATION (N/A) ESOPHAGOGASTRODUODENOSCOPY (EGD) (N/A)  Laparoscopic bilateral tap block  SURGEON:  Surgeon(s) and Role:    Gaynelle Adu, MD - Primary    * Berna Bue, MD - Assisting  PHYSICIAN ASSISTANT:   ASSISTANTS: Phylliss Blakes MD   ANESTHESIA:   general  EBL:  25 mL   BLOOD ADMINISTERED:none  DRAINS: none   LOCAL MEDICATIONS USED:  MARCAINE    and OTHER exparel  SPECIMEN:  Source of Specimen:  hernia sac  DISPOSITION OF SPECIMEN:  N/A  COUNTS:  YES  TOURNIQUET:  * No tourniquets in log *  DICTATION: .Dragon Dictation  PLAN OF CARE: Admit for overnight observation  PATIENT DISPOSITION:  PACU - hemodynamically stable.   Delay start of Pharmacological VTE agent (>24hrs) due to surgical blood loss or risk of bleeding: no

## 2020-10-31 NOTE — Anesthesia Postprocedure Evaluation (Signed)
Anesthesia Post Note  Patient: KAELAH HAYASHI  Procedure(s) Performed: XI ROBOTIC ASSISTED HIATAL HERNIA REPAIR WITH PARTIAL FUNDOPLICATION (Abdomen) ESOPHAGOGASTRODUODENOSCOPY (EGD) (Esophagus)     Patient location during evaluation: PACU Anesthesia Type: General Level of consciousness: awake and alert Pain management: pain level controlled Vital Signs Assessment: post-procedure vital signs reviewed and stable Respiratory status: spontaneous breathing, nonlabored ventilation, respiratory function stable and patient connected to nasal cannula oxygen Cardiovascular status: blood pressure returned to baseline and stable Postop Assessment: no apparent nausea or vomiting Anesthetic complications: no   No notable events documented.  Last Vitals:  Vitals:   10/31/20 1623 10/31/20 1734  BP: (!) 170/81 (!) 151/75  Pulse: 85 71  Resp: 18 18  Temp: 36.8 C   SpO2: 97% 94%    Last Pain:  Vitals:   10/31/20 1623  TempSrc: Oral  PainSc:                  Wileen Duncanson

## 2020-10-31 NOTE — Op Note (Signed)
PATIENT:  [name]  65 y.o. female   PRE-OPERATIVE DIAGNOSIS:  large hiatal hernia, gerd   POST-OPERATIVE DIAGNOSIS:  large hiatal hernia, gerd   PROCEDURE:  Procedure(s): XI ROBOTIC ASSISTED HIATAL HERNIA REPAIR WITH PARTIAL FUNDOPLICATION & GASTROPEXY (N/A) ESOPHAGOGASTRODUODENOSCOPY (EGD) (N/A)  Laparoscopic bilateral tap block   SURGEON:  Surgeon(s) and Role:    Gaynelle Adu, MD - Primary    * Berna Bue, MD - Assisting   PHYSICIAN ASSISTANT:   ASSISTANTS: Phylliss Blakes MD    ANESTHESIA:   general   EBL:  25 mL    BLOOD ADMINISTERED:none   DRAINS: none    LOCAL MEDICATIONS USED:  MARCAINE    and OTHER exparel   SPECIMEN:  Source of Specimen:  hernia sac   DISPOSITION OF SPECIMEN:  N/A   COUNTS:  YES   TOURNIQUET:  * No tourniquets in log * Indications for procedure: Patient has a longstanding history of hiatal hernia and GERD symptoms.  She had an 8 to 10 cm hiatal hernia on upper endoscopy and a moderate hiatal hernia on upper GI.  She was unable to tolerate a manometry because they could not pass the catheter.  Her GERD symptoms are relatively well controlled on oral medication but she was having issues with respect to the size of her hernia with respect to pressure when bending forward as well as occasional globus sensation she desired surgical intervention.  Please see outside records for details regarding discussion about risk and benefits  Description of procedure: Patient was given oral Tylenol prior to surgery.  She received 5000 units of subcutaneous heparin as well.  After informed consent was obtained she was taken to the OR to at [facility] and placed supine on the operating room table.  General endotracheal anesthesia was established.  Sequential compression devices were placed.  A Foley catheter was placed.  Her abdomen was prepped and draped in the usual standard surgical fashion with ChloraPrep.  IV antibiotic was administered.   Surgical timeout was performed.  Access to the abdomen was obtained using Optiview technique in the left midabdomen about the palms width from the umbilicus about an inch below the level of the umbilicus.  Using a 5 mm 0 degree laparoscope through a 5 mm trocar I advanced it through all layers of the abdominal wall and carefully entered the abdominal cavity.  Pneumoperitoneum was smoothly established.  There is no evidence of injury to surrounding viscera.  Patient was placed in reverse Trendelenburg.  An 8 mm robotic trocar was placed slightly above into the left of the umbilicus.  An 8 mm trocar was placed in the right midabdomen.  An assistant 5 mm port was placed in the right lateral abdominal wall.  The Optiview trocar was exchanged for an 8 mm robotic trocar and a final 8 mm trocar was placed in the lateral left abdominal wall-all of these under direct visualization.  A bilateral laparoscopic tap block was performed for postoperative pain relief.  We then placed a Nathanson liver retractor through a subxiphoid trocar site to lift up the left lobe of the liver.  This provided excellent visualization of the hiatus.  Patient had a large sliding hiatal hernia.  At this point the robot was docked to the robotic trochars.  The camera was attached to the arm 2 and anatomy was targeted.  We then placed a fenestrated bipolar in arm 1, vessel sealer in arm 3 and a tip up grasper in  arm 4.  I then went to the surgeon's console.  The fundus and upper third of the stomach including the GE junction were in the chest.  I incised at the fat pad over the right crus of the diaphragm.  This was done with vessel sealer.  I then was able to get into the hernia sac and gently retracted from the peritoneum overlying the right crus of the diaphragm and started going up the right crus of the diaphragm into the mediastinum.  Identified the parietal pleura and was able to bluntly dissect it away from the hernia sac.  I continued  mobilizing it anteriorly.  Her hernia sac was quite thick.  I then turned my attention to the greater curvature the stomach.  The tip up grasper was used to lift up the tissue along the greater curvature the stomach while my fenestrated bipolar held the stomach and I took down the short gastrics starting at the upper one third of the stomach with the vessel sealer.  I continued all the way up to the left crus of the diaphragm.  There was herniated fat and perigastric tissue up into the mediastinum posteriorly.  At this point Dr. Fredricka Bonine join me in the operating room.  She provided some additional retraction when I went back to the right side of the stomach.  After taking down some additional posterior attachments along the left crus of the diaphragm with the vessel sealer I was able to pass the fenestrated bipolar retrogastric up into the left upper abdomen.  We then placed a Penrose drain in the abdomen I was able to encircle the upper stomach distal esophagus and she was able to retract that why continued the mediastinal dissection.  I was able to perform a circumferential mediastinal mobilization of the esophagus mostly using blunt dissection and occasionally using vessel sealer where needed.  I ensured that I was staying away from the esophagus.  I did not visualize the posterior vagus but I did come across and see the anterior vagus.  Again the patient had a very thick hernia sac.  I was able to reduce all of it up out of the mediastinum.  The aorta was identified and preserved.  I had stripped the hernia sac away from the left parietal pleural as well.  At this point it appeared that we had achieved adequate circumferential mobilization of the esophagus and had 2 to 3 cm of esophagus into the abdominal cavity.  I obtained the Olympus endoscope placed in the patient's oropharynx and guided down the esophagus.  There is no evidence of esophageal injury.  Her esophagus was somewhat tortuous.  Identified the  Z-line.  The Z-line was within the abdominal cavity.  It was below the diaphragm.  The endoscope was removed  I went back to the robotic console.  I continued some additional sequential mobilization of the esophagus in the mediastinum taking down some thin avascular tissue.  This gave me an additional centimeter of esophagus in the abdomen.  At this time I went about closing the left and right crura.  My assistant placed two-0 Ethibond sutures in the abdominal cavity.  We reduced pneumoperitoneum to 10 mmHg.  I was able to reapproximate the left and right crura with 4 interrupted 0 Ethibond sutures.  This left a small gap around the esophagus at the hiatus.  Because of her age and inability to get a manometry preoperatively I decided to perform a toupee/partial fundoplication.  I was able to grab  the fundus and bring it retrogastric to the patient's right side the abdomen.  Then grabbing the greater curvature little bit further down on the left I was able to perform a shoeshine maneuver.  We then had anesthesia placed a 56 French lighted tip bougie in the oropharynx and guided down into the stomach.  I then performed a toupee partial fundoplication.  3 erupted sutures of 0 Ethibond were placed on the patient's right side taking seromuscular bites of the right side of the esophagus to the fundus that had been passed retrogastric.  In a similar fashion 3 interrupted sutures of 0 Ethibond were placed between the stomach and the left upper quadrant and the left side of the esophagus.  The bougie was removed.  I then placed 2 sutures 0 Ethibond from the posterior wrap to the diaphragmatic closure as a gastropexy.  I then went to the bedside and obtained the endoscope and again placed in the oropharynx and guided it down into the stomach.  The stomach was insufflated and the endoscope was retroflexed to visualize the hiatus.  The hiatus was well approximated.  There is a Hill grade valve 1 appearance.  A partial  posterior wrap was visualized.  The scope was detorsed and the stomach was desufflated and the endoscope was removed.  The [facility] liver retractor was removed.  Robot was undocked.  Robotic trochars were removed skin incisions were closed with 4-0 Monocryl in a subcuticular fashion.  There were 2 robotic trocar sites that had a little bit of oozing so I placed an additional interrupted Monocryl sutures in those locations.  There is no additional bleeding.  Dermabond was placed.  Foley catheter was removed.  All needle, instrument, and sponge counts were correct x2.  There were no immediate complications.  Patient was taken to the recovery room in stable condition   PLAN OF CARE: Admit for overnight observation   PATIENT DISPOSITION:  PACU - hemodynamically stable.   Delay start of Pharmacological VTE agent (>24hrs) due to surgical blood loss or risk of bleeding: no

## 2020-10-31 NOTE — Anesthesia Procedure Notes (Signed)
Procedure Name: Intubation Date/Time: 10/31/2020 9:15 AM Performed by: Gean Maidens, CRNA Pre-anesthesia Checklist: Patient identified, Emergency Drugs available, Suction available, Patient being monitored and Timeout performed Patient Re-evaluated:Patient Re-evaluated prior to induction Oxygen Delivery Method: Circle system utilized Preoxygenation: Pre-oxygenation with 100% oxygen Induction Type: IV induction Ventilation: Mask ventilation without difficulty Laryngoscope Size: Mac and 3 Grade View: Grade I Tube type: Oral Tube size: 7.0 mm Number of attempts: 1 Airway Equipment and Method: Stylet Placement Confirmation: ETT inserted through vocal cords under direct vision, positive ETCO2 and breath sounds checked- equal and bilateral Secured at: 21 cm Tube secured with: Tape Dental Injury: Teeth and Oropharynx as per pre-operative assessment

## 2020-11-01 ENCOUNTER — Encounter (HOSPITAL_COMMUNITY): Payer: Self-pay | Admitting: General Surgery

## 2020-11-01 ENCOUNTER — Observation Stay (HOSPITAL_COMMUNITY): Payer: 59

## 2020-11-01 DIAGNOSIS — K449 Diaphragmatic hernia without obstruction or gangrene: Secondary | ICD-10-CM | POA: Diagnosis not present

## 2020-11-01 LAB — BASIC METABOLIC PANEL
Anion gap: 7 (ref 5–15)
BUN: 12 mg/dL (ref 8–23)
CO2: 25 mmol/L (ref 22–32)
Calcium: 8.6 mg/dL — ABNORMAL LOW (ref 8.9–10.3)
Chloride: 106 mmol/L (ref 98–111)
Creatinine, Ser: 0.67 mg/dL (ref 0.44–1.00)
GFR, Estimated: >60 mL/min (ref >60)
Glucose, Bld: 84 mg/dL (ref 70–99)
Potassium: 4.3 mmol/L (ref 3.5–5.1)
Sodium: 138 mmol/L (ref 135–145)

## 2020-11-01 LAB — CBC
HCT: 42 % (ref 36.0–46.0)
Hemoglobin: 13.5 g/dL (ref 12.0–15.0)
MCH: 31.5 pg (ref 26.0–34.0)
MCHC: 32.1 g/dL (ref 30.0–36.0)
MCV: 97.9 fL (ref 80.0–100.0)
Platelets: 175 10*3/uL (ref 150–400)
RBC: 4.29 MIL/uL (ref 3.87–5.11)
RDW: 12.1 % (ref 11.5–15.5)
WBC: 7.1 10*3/uL (ref 4.0–10.5)
nRBC: 0 % (ref 0.0–0.2)

## 2020-11-01 LAB — GLUCOSE, CAPILLARY
Glucose-Capillary: 84 mg/dL (ref 70–99)
Glucose-Capillary: 81 mg/dL (ref 70–99)
Glucose-Capillary: 121 mg/dL — ABNORMAL HIGH (ref 70–99)
Glucose-Capillary: 93 mg/dL (ref 70–99)

## 2020-11-01 MED ORDER — ONDANSETRON HCL 4 MG PO TABS: 4.0000 mg | ORAL_TABLET | Freq: Three times a day (TID) | ORAL | 0 refills | Status: DC | PRN

## 2020-11-01 MED ORDER — TRAMADOL HCL 50 MG PO TABS
50.0000 mg | ORAL_TABLET | Freq: Four times a day (QID) | ORAL | 0 refills | Status: AC | PRN
Start: 2020-11-01 — End: 2020-11-06

## 2020-11-01 MED ORDER — IOHEXOL 300 MG/ML  SOLN
50.0000 mL | Freq: Once | INTRAMUSCULAR | Status: AC | PRN
  Administered 2020-11-01: 50 mL via ORAL

## 2020-11-01 NOTE — Telephone Encounter (Signed)
This should not be a problem due to your circumstances. You will need to limit the stronger medication to one week after hospital discharge and set up a time to see [name] to consider next steps based on your pain at that time.

## 2020-11-01 NOTE — Discharge Summary (Signed)
Physician Discharge Summary  PERSAIS [name] NWG:[id number] DOB: April 28, 1955 DOA: 10/31/2020  PCP: Bennie Pierini, FNP  Admit date: 10/31/2020 Discharge date: 11/01/2020  Recommendations for Outpatient Follow-up:     Follow-up Information     Gaynelle Adu, MD. Schedule an appointment as soon as possible for a visit in 3 week(s).   Specialty: General Surgery Why: For wound re-check Contact information: [address] [place] [address] [phone]                Discharge Diagnoses:  GERD/large hiatal hernia Chronic pain syndrome History of bipolar Coronary artery disease Fibromyalgia   Surgical Procedure: Robotic hiatal hernia repair with partial fundoplication and upper endoscopy, bilateral laparoscopic tap block October 31, 2020  Discharge Condition: Good Disposition: Home  Diet recommendation: Full liquid diet  Filed Weights   10/31/20 0711  Weight: 61.2 kg    History of present illness:  Patient presented for elective repair of large hiatal hernia and GERD.  Please see outside records for additional information  Hospital Course:  She underwent robotic hiatal hernia repair with partial/toupee fundoplication with upper endoscopy and bilateral tap block.  She was kept overnight for observation.  She was started on clear liquids on postop day 0.  On postoperative day 1 she was tolerating a full liquid diet.  She remained without fever or tachycardia.  She underwent a routine water-soluble swallow test which demonstrated repair of her hiatal hernia with no signs of leak or extravasation.  Her wrap is intact.  She was ambulating.  Her pain was controlled.  Her vital signs were stable.  We discussed diet progression for the next few weeks as well as what to expect.  We discussed pain control.  She is on chronic pain medication and states that she has a pain contract with stated her pain physician states that she could have a separate prescription for a  few days of opioids.  We discussed taking nonopioid pain medication such as Tylenol and ibuprofen on a regular basis and then her narcotics as needed.  I felt she was stable for discharge  BP 137/80 (BP Location: Right Arm)   Pulse (!) 48   Temp 97.7 F (36.5 C) (Oral)   Resp 17   Ht 5\' 2"  (1.575 m)   Wt 61.2 kg   SpO2 94%   BMI 24.69 kg/m   Gen: alert, NAD, non-toxic appearing Pupils: equal, no scleral icterus Pulm: Lungs clear to auscultation, symmetric chest rise CV: regular rate and rhythm Abd: soft, mild approp tender, nondistended.  No cellulitis. No incisional hernia Ext: no edema, no calf tenderness Skin: no rash, no jaundice    Discharge Instructions  Discharge Instructions     Call MD for:   Complete by: As directed    Temperature >101   Call MD for:  hives   Complete by: As directed    Call MD for:  persistant dizziness or light-headedness   Complete by: As directed    Call MD for:  persistant nausea and vomiting   Complete by: As directed    Call MD for:  redness, tenderness, or signs of infection (pain, swelling, redness, odor or green/yellow discharge around incision site)   Complete by: As directed    Call MD for:  severe uncontrolled pain   Complete by: As directed    Diet full liquid   Complete by: As directed    Discharge instructions   Complete by: As directed  See [facility] discharge instructions   Increase activity slowly   Complete by: As directed       Allergies as of 11/01/2020       Reactions   Codeine Other (See Comments)   "I will have a heart attack."   Morphine And Related Other (See Comments)   "It will cause me to have a heart attack."   Ambien [zolpidem Tartrate] Nausea And Vomiting   Clonidine Derivatives Nausea And Vomiting   gerd - caused acid reflux per pt   Metformin And Related    Nausea/vomiting/cramping from metformin   Lyrica [pregabalin] Swelling, Other (See Comments)   Weight gain   Neurontin [gabapentin] Other (See  Comments)   Causes elevated LFTs        Medication List     STOP taking these medications    albuterol (2.5 MG/3ML) 0.083% nebulizer solution Commonly known as: PROVENTIL   cephALEXin 500 MG capsule Commonly known as: KEFLEX   methocarbamol 500 MG tablet Commonly known as: ROBAXIN   sucralfate 1 g tablet Commonly known as: CARAFATE   sucralfate 1 GM/10ML suspension Commonly known as: CARAFATE   valACYclovir 500 MG tablet Commonly known as: VALTREX       TAKE these medications    dexlansoprazole 60 MG capsule Commonly known as: Dexilant Take 1 capsule (60 mg total) by mouth daily.   DULoxetine 60 MG capsule Commonly known as: CYMBALTA Take 1 capsule (60 mg total) by mouth at bedtime.   empagliflozin 25 MG Tabs tablet Commonly known as: Jardiance Take 1 tablet (25 mg total) by mouth daily before breakfast.   escitalopram 20 MG tablet Commonly known as: LEXAPRO Take 1 tablet (20 mg total) by mouth daily.   FIBER PO Take 2 tablets by mouth in the morning, at noon, and at bedtime.   Global Alcohol Prep Ease 70 % Pads USE 1 PAD DAILY WHEN CHECKING BLOOD SUGAR. R73.03   HYDROcodone-acetaminophen 7.5-325 MG tablet Commonly known as: Norco Take 1 tablet by mouth in the morning, at noon, and at bedtime. Start taking on: November 27, 2020   lamoTRIgine 150 MG tablet Commonly known as: LAMICTAL Take 1 tablet (150 mg total) by mouth daily.   ondansetron 4 MG tablet Commonly known as: ZOFRAN Take 1 tablet (4 mg total) by mouth every 8 (eight) hours as needed for nausea or vomiting. What changed: See the new instructions.   OneTouch Delica Plus Lancet33G Misc USE TO CHECK SUGAR DAILY.   OneTouch Verio test strip Generic drug: glucose blood Test BS daily R73.03   PROBIOTIC PO Take 1 capsule by mouth daily.   rosuvastatin 10 MG tablet Commonly known as: CRESTOR Take 1 tablet (10 mg total) by mouth daily.   traMADol 50 MG tablet Commonly known as:  Ultram Take 1 tablet (50 mg total) by mouth every 6 (six) hours as needed for up to 5 days.   traZODone 150 MG tablet Commonly known as: DESYREL Take 1 tablet (150 mg total) by mouth at bedtime.       ASK your doctor about these medications    acyclovir ointment 5 % Commonly known as: ZOVIRAX APPLY TOPICALLY EVERY 3 HOURS.   albuterol 108 (90 Base) MCG/ACT inhaler Commonly known as: VENTOLIN HFA INHALE 2 PUFFS EVERY 6 HOURS AS NEEDED FOR SHORTNESS OF BREATH AND WHEEZING.   Breo Ellipta 200-25 MCG/INH Aepb Generic drug: fluticasone furoate-vilanterol Inhale 1 puff into the lungs daily.   busPIRone 10 MG tablet Commonly known as: BUSPAR  TAKE 1 TABLET BY MOUTH TWICE DAILY AS NEEDED.   butalbital-acetaminophen-caffeine 50-325-40 MG tablet Commonly known as: FIORICET TAKE 1 TABLET BY MOUTH EVERY 6 HOURS AS NEEDED FOR HEADACHE.   carvedilol 6.25 MG tablet Commonly known as: COREG TAKE (1) TABLET TWICE DAILY.   celecoxib 200 MG capsule Commonly known as: CELEBREX TAKE 1 CAPSULE BY MOUTH TWICE DAILY BETWEEN MEALS AS NEEDED.   diclofenac Sodium 1 % Gel Commonly known as: VOLTAREN APPLY 4 GRAMS TOPICALLY 4 TIMES A DAY.   estradiol 0.1 MG/GM vaginal cream Commonly known as: ESTRACE Place 1 Applicatorful vaginally at bedtime.   fludrocortisone 0.1 MG tablet Commonly known as: FLORINEF TAKE 1 TABLET EVERY OTHER DAY.   fluticasone 50 MCG/ACT nasal spray Commonly known as: FLONASE USE 1 SPRAY IN EACH NOSTRIL ONCE DAILY.   furosemide 40 MG tablet Commonly known as: LASIX TAKE 1 TABLET ONCE DAILY AS NEEDED FOR FLUID.   hydrALAZINE 25 MG tablet Commonly known as: APRESOLINE TAKE 1 TABLET BY MOUTH 3 TIMES DAILY AS NEEDED (FOR SEVERE HYPERTENSION/SYSTOLIC NUMBER 170 OR GREATER).   ipratropium 0.02 % nebulizer solution Commonly known as: ATROVENT USE 1 VIAL ( ) IN NEBULIZER EVERY 6 HOURS AS NEEDED FOR WHEEZING OR SHORTNESS OF BREATH.   isosorbide mononitrate 30 MG 24  hr tablet Commonly known as: IMDUR TAKE 1 TABLET ONCE DAILY.   levETIRAcetam 500 MG tablet Commonly known as: KEPPRA TAKE (1) TABLET TWICE DAILY.   levocetirizine 5 MG tablet Commonly known as: XYZAL TAKE 1 TABLET BY MOUTH EVERY MORNING.   lidocaine 5 % ointment Commonly known as: XYLOCAINE APPLY TO AFFECTED AREA 3 TIMES A DAY AS NEEDED FOR MILD OR MODERATE PAIN.   mupirocin ointment 2 % Commonly known as: BACTROBAN APPLY TO AFFECTED AREA TWICE DAILY.   nitroGLYCERIN 0.4 MG SL tablet Commonly known as: NITROSTAT PLACE ONE (1) TABLET UNDER TONGUE EVERY 5 MINUTES UP TO (3) DOSES AS NEEDED FOR CHEST PAIN.   oxybutynin 5 MG 24 hr tablet Commonly known as: DITROPAN-XL Take 1 tablet (5 mg total) by mouth at bedtime.   tiZANidine 4 MG tablet Commonly known as: ZANAFLEX TAKE 1 TABLET EVERY 8 HOURS AS NEEDED FOR MUSCLE SPASMS.   valACYclovir 1000 MG tablet Commonly known as: VALTREX TAKE 1 TABLET 3 TIMES A DAY. TO BE TAKEN ONLY WHEN SHE HAS OUT BREAK.        Follow-up Information     Gaynelle Adu, MD. Schedule an appointment as soon as possible for a visit in 3 week(s).   Specialty: General Surgery Why: For wound re-check Contact information: [address] [place] [address] [phone]                  The results of significant diagnostics from this hospitalization (including imaging, microbiology, ancillary and laboratory) are listed below for reference.    Significant Diagnostic Studies: DG UGI W SINGLE CM (SOL OR THIN BA)  Result Date: 11/01/2020 CLINICAL DATA:  Postop day 1 hiatal hernia repair EXAM: WATER SOLUBLE UPPER GI SERIES TECHNIQUE: Single-column upper GI series was performed using water soluble contrast. CONTRAST:  23mL OMNIPAQUE IOHEXOL 300 MG/ML  SOLN COMPARISON:  Upper GI 09/11/2020 FLUOROSCOPY TIME:  Fluoroscopy Time:  0 minutes 54 second Radiation Exposure Index (if provided by the fluoroscopic device): Number of Acquired Spot  Images: 7 FINDINGS: Preliminary KUB reveals marked gastric distension. Gas in nondilated large and small bowel loops. Patient swallowed water-soluble contrast. The esophageal mucosa is normal. There is mild  distension of the esophagus. There is dysmotility in the distal esophagus. Hiatal hernia has been reduced. No residual hiatal hernia. No stricture. Contrast flows readily into the stomach which is markedly distended. No esophageal leak identified. IMPRESSION: Satisfactory hiatal hernia repair.  No residual hiatal hernia. No esophageal leak identified. Electronically Signed   By: Marlan Palau M.D.   On: 11/01/2020 09:16    Microbiology: Recent Results (from the past 240 hour(s))  SARS Coronavirus 2 (TAT 6-24 hrs)     Status: None   Collection Time: 10/29/20 12:00 AM  Result Value Ref Range Status   SARS Coronavirus 2 RESULT: NEGATIVE  Final    Comment: RESULT: NEGATIVESARS-CoV-2 INTERPRETATION:A NEGATIVE  test result means that SARS-CoV-2 RNA was not present in the specimen above the limit of detection of this test. This does not preclude a possible SARS-CoV-2 infection and should not be used as the  sole basis for patient management decisions. Negative results must be combined with clinical observations, patient history, and epidemiological information. Optimum specimen types and timing for peak viral levels during infections caused by SARS-CoV-2  have not been determined. Collection of multiple specimens or types of specimens may be necessary to detect virus. Improper specimen collection and handling, sequence variability under primers/probes, or organism present below the limit of detection may  lead to false negative results. Positive and negative predictive values of testing are highly dependent on prevalence. False negative test results are more likely when prevalence of disease is high.The expected result is NEGATIVE.Fact S heet for  Healthcare Providers:  [employer] Sheet for Patients: https://poole-freeman.org/ Reference Range - Negative      Labs: Basic Metabolic Panel: Recent Labs  Lab 11/01/20 0429  NA 138  K 4.3  CL 106  CO2 25  GLUCOSE 84  BUN 12  CREATININE 0.67  CALCIUM 8.6*   Liver Function Tests: No results for input(s): AST, ALT, ALKPHOS, BILITOT, PROT, ALBUMIN in the last 168 hours. No results for input(s): LIPASE, AMYLASE in the last 168 hours. No results for input(s): AMMONIA in the last 168 hours. CBC: Recent Labs  Lab 11/01/20 0429  WBC 7.1  HGB 13.5  HCT 42.0  MCV 97.9  PLT 175   Cardiac Enzymes: No results for input(s): CKTOTAL, CKMB, CKMBINDEX, TROPONINI in the last 168 hours. BNP: BNP (last 3 results) No results for input(s): BNP in the last 8760 hours.  ProBNP (last 3 results) No results for input(s): PROBNP in the last 8760 hours.  CBG: Recent Labs  Lab 10/31/20 1534 10/31/20 2008 11/01/20 0412 11/01/20 0745 11/01/20 1119  GLUCAP 107* 143* 84 81 121*    Active Problems:   S/P laparoscopic fundoplication   Time coordinating discharge: 25 min  Signed:  Atilano Ina, MD [facility], [place] [phone] 11/01/2020, 1:06 PM

## 2020-11-01 NOTE — Discharge Instructions (Signed)
EATING AFTER YOUR ESOPHAGEAL SURGERY (Stomach Fundoplication, Hiatal Hernia repair, Achalasia surgery, etc)  ######################################################################  EAT Start with a pureed / full liquid diet (see below) Gradually transition to a high fiber diet with a fiber supplement over the next month after discharge.    WALK Walk an hour a day.  Control your pain to do that.    CONTROL PAIN Control pain so that you can walk, sleep, tolerate sneezing/coughing, go up/down stairs.  HAVE A BOWEL MOVEMENT DAILY Keep your bowels regular to avoid problems.  OK to try a laxative to override constipation.  OK to use an antidairrheal to slow down diarrhea.  Call if not better after 2 tries  CALL IF YOU HAVE PROBLEMS/CONCERNS Call if you are still struggling despite following these instructions. Call if you have concerns not answered by these instructions  ######################################################################   After your esophageal surgery, expect some sticking with swallowing over the next 1-2 months.    If food sticks when you eat, it is called "dysphagia".  This is due to swelling around your esophagus at the wrap & hiatal diaphragm repair.  It will gradually ease off over the next few months.  To help you through this temporary phase, we start you out on a full liquid diet.  Your first meal in the hospital was thin liquids.  You should have been given a full liquid diet by the time you left the hospital. Stay on clears and full liquids for the first few days. Once tolerating that well, you can advance to pureed diet.   We ask patients to stay on a pureed diet for the first 2-3 weeks to avoid anything getting "stuck" near your recent surgery.  Don't be alarmed if your ability to swallow doesn't progress according to this plan.  Everyone is different and some diets can advance more or less quickly.    It is often helpful to crush your medications or split  them as they can sometimes stick, especially the first week or so.   Some BASIC RULES to follow are: Maintain an upright position whenever eating or drinking. Take small bites - just a teaspoon size bite at a time. Eat slowly.  It may also help to eat only one food at a time. Consider nibbling through smaller, more frequent meals & avoid the urge to eat BIG meals Do not push through feelings of fullness, nausea, or bloatedness Do not mix solid foods and liquids in the same mouthful Try not to "wash foods down" with large gulps of liquids. Avoid carbonated (bubbly/fizzy) drinks.   Avoid foods that make you feel gassy or bloated.  Start with bland foods first.  Wait on trying greasy, fried, or spicy meals until you are tolerating more bland solids well. Understand that it will be hard to burp and belch at first.  This gradually improves with time.  Expect to be more gassy/flatulent/bloated initially.  Walking will help your body manage it better. Consider using medications for bloating that contain simethicone such as  Maalox or Gas-X  Consider crushing her medications, especially smaller pills.  The ability to swallow pills should get easier after a few weeks Eat in a relaxed atmosphere & minimize distractions. Avoid talking while eating.   Do not use straws. Following each meal, sit in an upright position (90 degree angle) for 60 to 90 minutes.  Going for a short walk can help as well If food does stick, don't panic.  Try to relax and let the   food pass on its own.  Sipping WARM LIQUID such as strong hot black tea can also help slide it down.   Be gradual in changes & use common sense:  -If you easily tolerating a certain "level" of foods, advance to the next level gradually -If you are having trouble swallowing a particular food, then avoid it.   -If food is sticking when you advance your diet, go back to thinner previous diet (the lower LEVEL) for 1-2 days.  LEVEL 2 = PUREED DIET  Do  for the first 2 WEEKS AFTER SURGERY AFTER YOU ARE TOLERATING A FULL LIQUID DIET EASILY  -Foods in this group are pureed or blenderized to a smooth, mashed potato-like consistency.  -If necessary, the pureed foods can keep their shape with the addition of a thickening agent.   -Meat should be pureed to a smooth, pasty consistency.  Hot broth or gravy may be added to the pureed meat, approximately 1 oz. of liquid per 3 oz. serving of meat. -CAUTION:  If any foods do not puree into a smooth consistency, swallowing will be more difficult.  (For example, nuts or seeds sometimes do not blend well.)  Hot Foods Cold Foods  Pureed scrambled eggs and cheese Pureed cottage cheese  Baby cereals Thickened juices and nectars  Thinned cooked cereals (no lumps) Thickened milk or eggnog  Pureed French toast or pancakes Ensure  Mashed potatoes Ice cream  Pureed parsley, au gratin, scalloped potatoes, candied sweet potatoes Fruit or Italian ice, sherbet  Pureed buttered or alfredo noodles Plain yogurt  Pureed vegetables (no corn or peas) Instant breakfast  Pureed soups and creamed soups Smooth pudding, mousse, custard  Pureed scalloped apples Whipped gelatin  Gravies Sugar, syrup, honey, jelly  Sauces, cheese, tomato, barbecue, white, creamed Cream  Any baby food Creamer  Alcohol in moderation (not beer or champagne) Margarine  Coffee or tea Mayonnaise   Ketchup, mustard   Apple sauce   SAMPLE MENU:  PUREED DIET Breakfast Lunch Dinner  Orange juice, 1/2 cup Cream of wheat, 1/2 cup Pineapple juice, 1/2 cup Pureed turkey, barley soup, 3/4 cup Pureed Hawaiian chicken, 3 oz  Scrambled eggs, mashed or blended with cheese, 1/2 cup Tea or coffee, 1 cup  Whole milk, 1 cup  Non-dairy creamer, 2 Tbsp. Mashed potatoes, 1/2 cup Pureed cooled broccoli, 1/2 cup Apple sauce, 1/2 cup Coffee or tea Mashed potatoes, 1/2 cup Pureed spinach, 1/2 cup Frozen yogurt, 1/2 cup Tea or coffee      LEVEL 3 = SOFT  DIET  After your first 3 weeks, you can advance to a soft diet.   Keep on this diet until everything goes down easily.  Hot Foods Cold Foods  White fish Cottage cheese  Stuffed fish Junior baby fruit  Baby food meals Semi thickened juices  Minced soft cooked, scrambled, poached eggs nectars  Souffle & omelets Ripe mashed bananas  Cooked cereals Canned fruit, pineapple sauce, milk  potatoes Milkshake  Buttered or Alfredo noodles Custard  Cooked cooled vegetable Puddings, including tapioca  Sherbet Yogurt  Vegetable soup or alphabet soup Fruit ice, Italian ice  Gravies Whipped gelatin  Sugar, syrup, honey, jelly Junior baby desserts  Sauces:  Cheese, creamed, barbecue, tomato, white Cream  Coffee or tea Margarine   SAMPLE MENU:  LEVEL 3 Breakfast Lunch Dinner  Orange juice, 1/2 cup Oatmeal, 1/2 cup Scrambled eggs with cheese, 1/2 cup Decaffeinated tea, 1 cup Whole milk, 1 cup Non-dairy creamer, 2 Tbsp Pineapple juice, 1/2 cup   Minced beef, 3 oz Gravy, 2 Tbsp Mashed potatoes, 1/2 cup Minced fresh broccoli, 1/2 cup Applesauce, 1/2 cup Coffee, 1 cup Turkey, barley soup, 3/4 cup Minced Hawaiian chicken, 3 oz Mashed potatoes, 1/2 cup Cooked spinach, 1/2 cup Frozen yogurt, 1/2 cup Non-dairy creamer, 2 Tbsp      LEVEL 4 = CHOPPED DIET  -After all the foods in level 3 (soft diet) are passing through well you should advance up to more chopped foods.  -It is still important to cut these foods into small pieces and eat slowly.  Hot Foods Cold Foods  Poultry Cottage cheese  Chopped Swedish meatballs Yogurt  Meat salads (ground or flaked meat) Milk  Flaked fish (tuna) Milkshakes  Poached or scrambled eggs Soft, cold, dry cereal  Souffles and omelets Fruit juices or nectars  Cooked cereals Chopped canned fruit  Chopped French toast or pancakes Canned fruit cocktail  Noodles or pasta (no rice) Pudding, mousse, custard  Cooked vegetables (no frozen peas, corn, or mixed  vegetables) Green salad  Canned small sweet peas Ice cream  Creamed soup or vegetable soup Fruit ice, Italian ice  Pureed vegetable soup or alphabet soup Non-dairy creamer  Ground scalloped apples Margarine  Gravies Mayonnaise  Sauces:  Cheese, creamed, barbecue, tomato, white Ketchup  Coffee or tea Mustard   SAMPLE MENU:  LEVEL 4 Breakfast Lunch Dinner  Orange juice, 1/2 cup Oatmeal, 1/2 cup Scrambled eggs with cheese, 1/2 cup Decaffeinated tea, 1 cup Whole milk, 1 cup Non-dairy creamer, 2 Tbsp Ketchup, 1 Tbsp Margarine, 1 tsp Salt, 1/4 tsp Sugar, 2 tsp Pineapple juice, 1/2 cup Ground beef, 3 oz Gravy, 2 Tbsp Mashed potatoes, 1/2 cup Cooked spinach, 1/2 cup Applesauce, 1/2 cup Decaffeinated coffee Whole milk Non-dairy creamer, 2 Tbsp Margarine, 1 tsp Salt, 1/4 tsp Pureed turkey, barley soup, 3/4 cup Barbecue chicken, 3 oz Mashed potatoes, 1/2 cup Ground fresh broccoli, 1/2 cup Frozen yogurt, 1/2 cup Decaffeinated tea, 1 cup Non-dairy creamer, 2 Tbsp Margarine, 1 tsp Salt, 1/4 tsp Sugar, 1 tsp    LEVEL 5:  REGULAR FOODS  -Foods in this group are soft, moist, regularly textured foods.   -This level includes meat and breads, which tend to be the hardest things to swallow.   -Eat very slowly, chew well and continue to avoid carbonated drinks. -most people are at this level in 6 weeks  Hot Foods Cold Foods  Baked fish or skinned Soft cheeses - cottage cheese  Souffles and omelets Cream cheese  Eggs Yogurt  Stuffed shells Milk  Spaghetti with meat sauce Milkshakes  Cooked cereal Cold dry cereals (no nuts, dried fruit, coconut)  French toast or pancakes Crackers  Buttered toast Fruit juices or nectars  Noodles or pasta (no rice) Canned fruit  Potatoes (all types) Ripe bananas  Soft, cooked vegetables (no corn, lima, or baked beans) Peeled, ripe, fresh fruit  Creamed soups or vegetable soup Cakes (no nuts, dried fruit, coconut)  Canned chicken noodle soup Plain  doughnuts  Gravies Ice cream  Bacon dressing Pudding, mousse, custard  Sauces:  Cheese, creamed, barbecue, tomato, white Fruit ice, Italian ice, sherbet  Decaffeinated tea or coffee Whipped gelatin  Pork chops Regular gelatin   Canned fruited gelatin molds   Sugar, syrup, honey, jam, jelly   Cream   Non-dairy   Margarine   Oil   Mayonnaise   Ketchup   Mustard   TROUBLESHOOTING IRREGULAR BOWELS  1) Avoid extremes of bowel movements (no bad constipation/diarrhea)  2) Miralax 17gm   mixed in 8oz. water or juice-daily. May use BID as needed.  3) Gas-x,Phazyme, etc. as needed for gas & bloating.  4) Soft,bland diet. No spicy,greasy,fried foods.  5) Prilosec over-the-counter as needed  6) May hold gluten/wheat products from diet to see if symptoms improve.  7) May try probiotics (Align, Activa, etc) to help calm the bowels down  7) If symptoms become worse call back immediately.    If you have any questions please call our office at [facility]: [phone].    

## 2020-11-01 NOTE — Telephone Encounter (Signed)
Patient aware and states she would rather here back from [facility] to make sure she does not mess up her contract.  Aware you are off this week  Would like to wait until you return

## 2020-11-01 NOTE — Progress Notes (Signed)
Patient was given discharge instructions, and all questions were answered.  Patient was stable for discharge and was taken to the main exit by wheelchair. 

## 2020-11-05 ENCOUNTER — Ambulatory Visit: Payer: 59 | Admitting: Orthopaedic Surgery

## 2020-11-06 ENCOUNTER — Telehealth: Payer: Self-pay | Admitting: Nurse Practitioner

## 2020-11-06 NOTE — Telephone Encounter (Signed)
Can only give her 5 days worth

## 2020-11-06 NOTE — Telephone Encounter (Signed)
Patient states that she was prescribed tramadol but never picked up the medication. Just wanted you to know. She has been taking Tylenol as needed to help with breakthrough pain

## 2020-11-06 NOTE — Telephone Encounter (Signed)
See previous phone message. Spoke with patient

## 2020-11-12 ENCOUNTER — Emergency Department (HOSPITAL_COMMUNITY)
Admission: EM | Admit: 2020-11-12 | Discharge: 2020-11-12 | Disposition: A | Discharge status: Home / Self Care | Payer: 59 | Attending: Emergency Medicine | Admitting: Emergency Medicine

## 2020-11-12 ENCOUNTER — Other Ambulatory Visit: Payer: Self-pay

## 2020-11-12 ENCOUNTER — Encounter (HOSPITAL_COMMUNITY): Payer: Self-pay

## 2020-11-12 ENCOUNTER — Emergency Department (HOSPITAL_COMMUNITY): Payer: 59

## 2020-11-12 DIAGNOSIS — I2510 Atherosclerotic heart disease of native coronary artery without angina pectoris: Secondary | ICD-10-CM | POA: Insufficient documentation

## 2020-11-12 DIAGNOSIS — J45909 Unspecified asthma, uncomplicated: Secondary | ICD-10-CM | POA: Insufficient documentation

## 2020-11-12 DIAGNOSIS — Z96611 Presence of right artificial shoulder joint: Secondary | ICD-10-CM | POA: Insufficient documentation

## 2020-11-12 DIAGNOSIS — E119 Type 2 diabetes mellitus without complications: Secondary | ICD-10-CM | POA: Diagnosis not present

## 2020-11-12 DIAGNOSIS — Z96642 Presence of left artificial hip joint: Secondary | ICD-10-CM | POA: Insufficient documentation

## 2020-11-12 DIAGNOSIS — I110 Hypertensive heart disease with heart failure: Secondary | ICD-10-CM | POA: Diagnosis not present

## 2020-11-12 DIAGNOSIS — Z79899 Other long term (current) drug therapy: Secondary | ICD-10-CM | POA: Insufficient documentation

## 2020-11-12 DIAGNOSIS — J449 Chronic obstructive pulmonary disease, unspecified: Secondary | ICD-10-CM | POA: Insufficient documentation

## 2020-11-12 DIAGNOSIS — I5032 Chronic diastolic (congestive) heart failure: Secondary | ICD-10-CM | POA: Diagnosis not present

## 2020-11-12 DIAGNOSIS — R1013 Epigastric pain: Secondary | ICD-10-CM | POA: Diagnosis not present

## 2020-11-12 DIAGNOSIS — Z7984 Long term (current) use of oral hypoglycemic drugs: Secondary | ICD-10-CM | POA: Insufficient documentation

## 2020-11-12 LAB — COMPREHENSIVE METABOLIC PANEL
ALT: 19 U/L (ref 0–44)
AST: 24 U/L (ref 15–41)
Albumin: 4.4 g/dL (ref 3.5–5.0)
Alkaline Phosphatase: 103 U/L (ref 38–126)
Anion gap: 6 (ref 5–15)
BUN: 11 mg/dL (ref 8–23)
CO2: 27 mmol/L (ref 22–32)
Calcium: 9.3 mg/dL (ref 8.9–10.3)
Chloride: 105 mmol/L (ref 98–111)
Creatinine, Ser: 0.68 mg/dL (ref 0.44–1.00)
GFR, Estimated: >60 mL/min (ref >60)
Glucose, Bld: 95 mg/dL (ref 70–99)
Potassium: 3.8 mmol/L (ref 3.5–5.1)
Sodium: 138 mmol/L (ref 135–145)
Total Bilirubin: 0.3 mg/dL (ref 0.3–1.2)
Total Protein: 7.6 g/dL (ref 6.5–8.1)

## 2020-11-12 LAB — CBC WITH DIFFERENTIAL/PLATELET
Abs Immature Granulocytes: 0.02 10*3/uL (ref 0.00–0.07)
Basophils Absolute: 0 10*3/uL (ref 0.0–0.1)
Basophils Relative: 1 %
Eosinophils Absolute: 0.1 10*3/uL (ref 0.0–0.5)
Eosinophils Relative: 1 %
HCT: 48 % — ABNORMAL HIGH (ref 36.0–46.0)
Hemoglobin: 15.4 g/dL — ABNORMAL HIGH (ref 12.0–15.0)
Immature Granulocytes: 0 %
Lymphocytes Relative: 32 %
Lymphs Abs: 2.1 10*3/uL (ref 0.7–4.0)
MCH: 30.9 pg (ref 26.0–34.0)
MCHC: 32.1 g/dL (ref 30.0–36.0)
MCV: 96.2 fL (ref 80.0–100.0)
Monocytes Absolute: 0.6 10*3/uL (ref 0.1–1.0)
Monocytes Relative: 9 %
Neutro Abs: 3.7 10*3/uL (ref 1.7–7.7)
Neutrophils Relative %: 57 %
Platelets: 200 10*3/uL (ref 150–400)
RBC: 4.99 MIL/uL (ref 3.87–5.11)
RDW: 12.2 % (ref 11.5–15.5)
WBC: 6.5 10*3/uL (ref 4.0–10.5)
nRBC: 0 % (ref 0.0–0.2)

## 2020-11-12 LAB — CBG MONITORING, ED: Glucose-Capillary: 111 mg/dL — ABNORMAL HIGH (ref 70–99)

## 2020-11-12 LAB — LIPASE, BLOOD: Lipase: 29 U/L (ref 11–51)

## 2020-11-12 NOTE — Discharge Instructions (Addendum)
Lab work and imaging are reassuring.  Please continue with the outpatient instructions as given per your general surgeon.  Please follow-up with your general surgeon on the 19th for further evaluation.  Come back to the emergency department if you develop chest pain, shortness of breath, severe abdominal pain, uncontrolled nausea, vomiting, diarrhea.
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
## 2020-11-25 ENCOUNTER — Other Ambulatory Visit: Payer: Self-pay

## 2020-11-25 ENCOUNTER — Encounter (HOSPITAL_COMMUNITY): Payer: Self-pay

## 2020-11-25 ENCOUNTER — Emergency Department (HOSPITAL_COMMUNITY): Payer: 59

## 2020-11-25 ENCOUNTER — Observation Stay (HOSPITAL_COMMUNITY)
Admission: EM | Admit: 2020-11-25 | Discharge: 2020-11-27 | Disposition: A | Discharge status: Home / Self Care | Payer: 59 | Attending: Internal Medicine | Admitting: Internal Medicine

## 2020-11-25 ENCOUNTER — Emergency Department (HOSPITAL_BASED_OUTPATIENT_CLINIC_OR_DEPARTMENT_OTHER): Payer: 59

## 2020-11-25 DIAGNOSIS — Z96642 Presence of left artificial hip joint: Secondary | ICD-10-CM | POA: Insufficient documentation

## 2020-11-25 DIAGNOSIS — I110 Hypertensive heart disease with heart failure: Secondary | ICD-10-CM | POA: Insufficient documentation

## 2020-11-25 DIAGNOSIS — M961 Postlaminectomy syndrome, not elsewhere classified: Secondary | ICD-10-CM | POA: Diagnosis present

## 2020-11-25 DIAGNOSIS — J45909 Unspecified asthma, uncomplicated: Secondary | ICD-10-CM | POA: Insufficient documentation

## 2020-11-25 DIAGNOSIS — E860 Dehydration: Secondary | ICD-10-CM

## 2020-11-25 DIAGNOSIS — R7303 Prediabetes: Secondary | ICD-10-CM | POA: Diagnosis present

## 2020-11-25 DIAGNOSIS — J449 Chronic obstructive pulmonary disease, unspecified: Secondary | ICD-10-CM | POA: Insufficient documentation

## 2020-11-25 DIAGNOSIS — K2100 Gastro-esophageal reflux disease with esophagitis, without bleeding: Secondary | ICD-10-CM

## 2020-11-25 DIAGNOSIS — I10 Essential (primary) hypertension: Secondary | ICD-10-CM | POA: Diagnosis present

## 2020-11-25 DIAGNOSIS — Z79899 Other long term (current) drug therapy: Secondary | ICD-10-CM | POA: Insufficient documentation

## 2020-11-25 DIAGNOSIS — R55 Syncope and collapse: Secondary | ICD-10-CM | POA: Diagnosis not present

## 2020-11-25 DIAGNOSIS — M79604 Pain in right leg: Secondary | ICD-10-CM

## 2020-11-25 DIAGNOSIS — I5032 Chronic diastolic (congestive) heart failure: Secondary | ICD-10-CM | POA: Diagnosis not present

## 2020-11-25 DIAGNOSIS — E876 Hypokalemia: Secondary | ICD-10-CM | POA: Diagnosis not present

## 2020-11-25 DIAGNOSIS — Z20822 Contact with and (suspected) exposure to covid-19: Secondary | ICD-10-CM | POA: Diagnosis not present

## 2020-11-25 DIAGNOSIS — E119 Type 2 diabetes mellitus without complications: Secondary | ICD-10-CM | POA: Diagnosis not present

## 2020-11-25 DIAGNOSIS — K219 Gastro-esophageal reflux disease without esophagitis: Secondary | ICD-10-CM | POA: Diagnosis present

## 2020-11-25 DIAGNOSIS — Z96611 Presence of right artificial shoulder joint: Secondary | ICD-10-CM | POA: Diagnosis not present

## 2020-11-25 DIAGNOSIS — I2510 Atherosclerotic heart disease of native coronary artery without angina pectoris: Secondary | ICD-10-CM | POA: Insufficient documentation

## 2020-11-25 DIAGNOSIS — F411 Generalized anxiety disorder: Secondary | ICD-10-CM | POA: Diagnosis present

## 2020-11-25 LAB — COMPREHENSIVE METABOLIC PANEL
ALT: 11 U/L (ref 0–44)
AST: 22 U/L (ref 15–41)
Albumin: 3.8 g/dL (ref 3.5–5.0)
Alkaline Phosphatase: 76 U/L (ref 38–126)
Anion gap: 7 (ref 5–15)
BUN: 26 mg/dL — ABNORMAL HIGH (ref 8–23)
CO2: 24 mmol/L (ref 22–32)
Calcium: 8.4 mg/dL — ABNORMAL LOW (ref 8.9–10.3)
Chloride: 106 mmol/L (ref 98–111)
Creatinine, Ser: 1.76 mg/dL — ABNORMAL HIGH (ref 0.44–1.00)
GFR, Estimated: 32 mL/min — ABNORMAL LOW (ref >60)
Glucose, Bld: 112 mg/dL — ABNORMAL HIGH (ref 70–99)
Potassium: 2.6 mmol/L — CL (ref 3.5–5.1)
Sodium: 137 mmol/L (ref 135–145)
Total Bilirubin: 0.5 mg/dL (ref 0.3–1.2)
Total Protein: 6.2 g/dL — ABNORMAL LOW (ref 6.5–8.1)

## 2020-11-25 LAB — LIPASE, BLOOD: Lipase: 47 U/L (ref 11–51)

## 2020-11-25 LAB — CBC
HCT: 43.5 % (ref 36.0–46.0)
Hemoglobin: 14.5 g/dL (ref 12.0–15.0)
MCH: 31.8 pg (ref 26.0–34.0)
MCHC: 33.3 g/dL (ref 30.0–36.0)
MCV: 95.4 fL (ref 80.0–100.0)
Platelets: 156 10*3/uL (ref 150–400)
RBC: 4.56 MIL/uL (ref 3.87–5.11)
RDW: 12.9 % (ref 11.5–15.5)
WBC: 9.3 10*3/uL (ref 4.0–10.5)
nRBC: 0 % (ref 0.0–0.2)

## 2020-11-25 LAB — MAGNESIUM: Magnesium: 2.1 mg/dL (ref 1.7–2.4)

## 2020-11-25 MED ORDER — POTASSIUM CHLORIDE 10 MEQ/100ML IV SOLN
10.0000 meq | INTRAVENOUS | Status: AC
  Administered 2020-11-25 – 2020-11-26 (×4): 10 meq via INTRAVENOUS
  Filled 2020-11-25 (×4): qty 100

## 2020-11-25 MED ORDER — SODIUM CHLORIDE 0.9 % IV BOLUS
1000.0000 mL | Freq: Once | INTRAVENOUS | Status: AC
  Administered 2020-11-25: 1000 mL via INTRAVENOUS

## 2020-11-25 MED ORDER — ONDANSETRON HCL 4 MG/2ML IJ SOLN
4.0000 mg | Freq: Once | INTRAMUSCULAR | Status: AC
  Administered 2020-11-25: 4 mg via INTRAVENOUS
  Filled 2020-11-25: qty 2

## 2020-11-25 MED ORDER — FENTANYL CITRATE PF 50 MCG/ML IJ SOSY
50.0000 ug | PREFILLED_SYRINGE | Freq: Once | INTRAMUSCULAR | Status: AC
  Administered 2020-11-25: 50 ug via INTRAVENOUS
  Filled 2020-11-25: qty 1

## 2020-11-25 NOTE — ED Provider Notes (Signed)
[facility] EMERGENCY DEPARTMENT Provider Note   CSN: [id number] Arrival date & time: 11/25/20  1939     History Chief Complaint  Patient presents with   Hypotension    [name] is a 65 y.o. female.  She is brought in by EMS for evaluation of syncope, low blood pressure, right lower leg pain.  She said she has not been able to eat or drink much for the last few days.  She passed out twice yesterday and injured her right lower leg falling down the stairs.  She passed out again today and her family called EMS.  She had recent hiatal hernia surgery 2 weeks ago with Dr. Andrey Campanile.  She said she was doing well eating up until a few days ago.  No chest pain or shortness of breath.  No head pain but does have neck pain.  Has upper abdominal pain.  Has pain in her right calf from the fall.  No numbness or focal weakness.  The history is provided by the patient and the EMS personnel.  Loss of Consciousness Episode history:  Multiple Most recent episode:  Today Progression:  Unchanged Chronicity:  New Context: standing up   Relieved by:  None tried Worsened by:  Nothing Ineffective treatments:  Lying down Associated symptoms: recent fall, recent injury and recent surgery   Associated symptoms: no chest pain, no diaphoresis, no difficulty breathing, no fever, no focal sensory loss, no focal weakness, no headaches, no nausea, no shortness of breath and no vomiting       Past Medical History:  Diagnosis Date   Allergy    Anemia    Anginal pain (HCC)    last time    Anxiety    Arthritis    RHEUMATOID   Asthma    Bipolar 1 disorder (HCC)    Blood transfusion without reported diagnosis    Cataracts, bilateral 07/2017   CHF (congestive heart failure) (HCC)    COPD (chronic obstructive pulmonary disease) (HCC)    Coronary artery disease    reported hx of "MI";  Echo 2009 with normal LVF;  Myoview 05/2011: no ischemia   Depression    Diabetes mellitus without complication (HCC)     Dyslipidemia    Dysrhythmia    SVT   Esophageal stricture    Fibromyalgia    GERD (gastroesophageal reflux disease)    H/O hiatal hernia    Head injury, unspecified    Headache    migraines   Herpes simplex infection    History of kidney stones    History of loop recorder    Managed by Dr. Sharrell Ku   Hyperlipidemia    Hypertension    Insomnia    Myocardial infarction [facility])    age 36   Osteoporosis    Pneumonia    hx   Seizures (HCC)    Shortness of breath    Sleep apnea    ? neg   Spinal stenosis of lumbar region    Spondylolisthesis    Status post placement of implantable loop recorder    Supraventricular tachycardia (HCC)    Syncope and collapse    s/p ILR; no arhythmogenic cause identified   UTI (lower urinary tract infection)     Patient Active Problem List   Diagnosis Date Noted   S/P laparoscopic fundoplication 10/31/2020   Status post total replacement of left hip 01/31/2020   Left ear pain 01/10/2020   Non-recurrent acute serous otitis media of both ears  01/10/2020   Dysuria 01/10/2020   Unilateral primary osteoarthritis, left hip 12/21/2019   S/P shoulder replacement, right 11/15/2019   Abnormal blood sugar 09/29/2019   Overweight (BMI 25.0-29.9) 04/15/2019   Diverticulosis 04/14/2019   Chronic diastolic CHF (congestive heart failure) (HCC) 04/14/2019   Chest pain 01/17/2019   Unstable angina (HCC) 10/20/2017   Chronic pain 09/23/2016   Pre-diabetes 09/02/2016   Recurrent major depressive disorder, in partial remission (HCC) 07/14/2016   Seizures (HCC) 07/14/2016   GAD (generalized anxiety disorder) 07/14/2016   Insomnia 09/05/2014   Allergic rhinitis 09/05/2014   Cervical spondylosis without myelopathy 12/19/2013    Class: Chronic   Neural foraminal stenosis of cervical spine 12/19/2013   Cervical radiculitis 09/19/2013   Lumbosacral spondylosis without myelopathy 11/16/2012   Postlaminectomy syndrome, lumbar region 11/16/2012   Herpes  simplex virus (HSV) infection 10/31/2008   Hyperlipidemia with target LDL less than 100 10/31/2008   Essential hypertension 10/31/2008   GERD 10/31/2008   SPINAL STENOSIS OF LUMBAR REGION 10/31/2008   Myalgia 10/31/2008   Osteoporosis 10/31/2008   SPONDYLOLISTHESIS 10/31/2008   SYNCOPE 10/31/2008   Sleep apnea 10/31/2008    Past Surgical History:  Procedure Laterality Date   BACK SURGERY     BREAST SURGERY     lumpectomy   CARDIAC CATHETERIZATION  10/06/2011   CATARACT EXTRACTION W/PHACO Right 07/31/2017   Procedure: CATARACT EXTRACTION PHACO AND INTRAOCULAR LENS PLACEMENT (IOC);  Surgeon: Fabio Pierce, MD;  Location: [facility];  Service: Ophthalmology;  Laterality: Right;  CDE: 2.33   CATARACT EXTRACTION W/PHACO Left 08/14/2017   Procedure: CATARACT EXTRACTION PHACO AND INTRAOCULAR LENS PLACEMENT (IOC);  Surgeon: Fabio Pierce, MD;  Location: [facility];  Service: Ophthalmology;  Laterality: Left;  CDE: 2.74   CHOLECYSTECTOMY     CYSTOSCOPY     stone   DIAGNOSTIC LAPAROSCOPY     laparoscopic cholecystectomy   DOPPLER ECHOCARDIOGRAPHY  2009   ESOPHAGOGASTRODUODENOSCOPY N/A 10/31/2020   Procedure: ESOPHAGOGASTRODUODENOSCOPY (EGD);  Surgeon: Gaynelle Adu, MD;  Location: [facility];  Service: General;  Laterality: N/A;   head up tilt table testing  06/15/2007   [name]   HEMORRHOID SURGERY     insertion of implatable loop recorder  08/11/2007   [name]   POSTERIOR CERVICAL FUSION/FORAMINOTOMY N/A 12/19/2013   Procedure: RIGHT C3-4.C4-5 AND C5-6 FORAMINOTOMIES;  Surgeon: Kerrin Champagne, MD;  Location: [facility] OR;  Service: Orthopedics;  Laterality: N/A;   TOTAL HIP ARTHROPLASTY Left 01/31/2020   Procedure: LEFT TOTAL HIP ARTHROPLASTY ANTERIOR APPROACH;  Surgeon: Kathryne Hitch, MD;  Location: [facility] OR;  Service: Orthopedics;  Laterality: Left;   TOTAL SHOULDER ARTHROPLASTY Right 11/15/2019   Procedure: RIGHT TOTAL SHOULDER ARTHROPLASTY;  Surgeon: Cammy Copa, MD;  Location: [facility];  Service: Orthopedics;  Laterality: Right;   TUBAL LIGATION     UPPER GASTROINTESTINAL ENDOSCOPY     XI ROBOTIC ASSISTED HIATAL HERNIA REPAIR N/A 10/31/2020   Procedure: XI ROBOTIC ASSISTED HIATAL HERNIA REPAIR WITH PARTIAL FUNDOPLICATION;  Surgeon: Gaynelle Adu, MD;  Location: [facility];  Service: General;  Laterality: N/A;     OB History   No obstetric history on file.     Family History  Problem Relation Age of Onset   Heart attack Father    Mental illness Father    Mental illness Mother    Heart attack Brother        stents   Alcohol abuse Brother    Heart disease Brother    Drug abuse  Brother    Diabetes Brother    Colon cancer Maternal Aunt    Cirrhosis Brother    Stomach cancer Neg Hx    Esophageal cancer Neg Hx    Rectal cancer Neg Hx     Social History   Tobacco Use   Smoking status: Never   Smokeless tobacco: Never  Vaping Use   Vaping Use: Never used  Substance Use Topics   Alcohol use: No    Alcohol/week: 0.0 standard drinks   Drug use: No    Home Medications Prior to Admission medications   Medication Sig Start Date End Date Taking? Authorizing Provider  acyclovir ointment (ZOVIRAX) 5 % APPLY TOPICALLY EVERY 3 HOURS. Patient taking differently: Apply 1 application topically every 3 (three) hours as needed (outbreak). 09/27/20   Daphine Deutscher, [name], FNP  albuterol (VENTOLIN HFA) 108 (90 Base) MCG/ACT inhaler INHALE 2 PUFFS EVERY 6 HOURS AS NEEDED FOR SHORTNESS OF BREATH AND WHEEZING. Patient taking differently: Inhale 2 puffs into the lungs every 6 (six) hours as needed for wheezing or shortness of breath. 06/28/20   Daphine Deutscher [name], FNP  Alcohol Swabs (GLOBAL ALCOHOL PREP EASE) 70 % PADS USE 1 PAD DAILY WHEN CHECKING BLOOD SUGAR. R73.03 10/29/20   Daphine Deutscher, [name], FNP  busPIRone (BUSPAR) 10 MG tablet TAKE 1 TABLET BY MOUTH TWICE DAILY AS NEEDED. Patient taking differently: Take 10 mg by mouth 2 (two) times daily as needed (agitation/anxiety).  10/29/20   Daphine Deutscher, [name], FNP  butalbital-acetaminophen-caffeine (FIORICET) 50-325-40 MG tablet TAKE 1 TABLET BY MOUTH EVERY 6 HOURS AS NEEDED FOR HEADACHE. Patient taking differently: Take 1 tablet by mouth every 6 (six) hours as needed for headache or migraine. 10/29/20   Daphine Deutscher, [name], FNP  carvedilol (COREG) 6.25 MG tablet TAKE (1) TABLET TWICE DAILY. Patient taking differently: 150 10/29/20   Daphine Deutscher, [name], FNP  celecoxib (CELEBREX) 200 MG capsule TAKE 1 CAPSULE BY MOUTH TWICE DAILY BETWEEN MEALS AS NEEDED. Patient taking differently: Take 200 mg by mouth 2 (two) times daily as needed for mild pain. 08/01/20   Kathryne Hitch, MD  dexlansoprazole (DEXILANT) 60 MG capsule Take 1 capsule (60 mg total) by mouth daily. 06/26/20   Daphine Deutscher, [name], FNP  diclofenac Sodium (VOLTAREN) 1 % GEL APPLY 4 GRAMS TOPICALLY 4 TIMES A DAY. Patient taking differently: Apply 4 g topically 4 (four) times daily as needed (joint pain). 10/29/20   Daphine Deutscher [name], FNP  DULoxetine (CYMBALTA) 60 MG capsule Take 1 capsule (60 mg total) by mouth at bedtime. 06/26/20   Daphine Deutscher [name], FNP  empagliflozin (JARDIANCE) 25 MG TABS tablet Take 1 tablet (25 mg total) by mouth daily before breakfast. 06/26/20   Daphine Deutscher, [name], FNP  escitalopram (LEXAPRO) 20 MG tablet Take 1 tablet (20 mg total) by mouth daily. 06/26/20   Daphine Deutscher, [name], FNP  estradiol (ESTRACE) 0.1 MG/GM vaginal cream Place 1 Applicatorful vaginally at bedtime. Patient not taking: No sig reported 12/02/19   Daphine Deutscher, [name], FNP  FIBER PO Take 2 tablets by mouth in the morning, at noon, and at bedtime.    [provider]  fludrocortisone (FLORINEF) 0.1 MG tablet TAKE 1 TABLET EVERY OTHER DAY. Patient taking differently: Take 0.1 mg by mouth every other day. 10/29/20   Antoine Poche, MD  fluticasone (FLONASE) 50 MCG/ACT nasal spray USE 1 SPRAY IN EACH NOSTRIL ONCE DAILY. Patient taking differently:  Place 1 spray into both nostrils daily as needed for allergies. 04/04/20   Daphine Deutscher, [name], FNP  fluticasone furoate-vilanterol (BREO ELLIPTA) 200-25 MCG/INH  AEPB Inhale 1 puff into the lungs daily. Patient taking differently: Inhale 1 puff into the lungs daily as needed (wheezing). 06/26/20   Daphine Deutscher, [name], FNP  furosemide (LASIX) 40 MG tablet TAKE 1 TABLET ONCE DAILY AS NEEDED FOR FLUID. Patient taking differently: Take 40 mg by mouth daily as needed for fluid. 10/29/20   Antoine Poche, MD  glucose blood [facility] VERIO) test strip Test BS daily R73.03 06/26/20   Bennie Pierini, FNP  hydrALAZINE (APRESOLINE) 25 MG tablet TAKE 1 TABLET BY MOUTH 3 TIMES DAILY AS NEEDED (FOR SEVERE HYPERTENSION/SYSTOLIC NUMBER 170 OR GREATER). Patient taking differently: Take 25 mg by mouth 3 (three) times daily as needed (systolic bp >170). 10/29/20   Antoine Poche, MD  HYDROcodone-acetaminophen (NORCO) 7.5-325 MG tablet Take 1 tablet by mouth in the morning, at noon, and at bedtime. 11/27/20 12/27/20  Daphine Deutscher, [name], FNP  ipratropium (ATROVENT) 0.02 % nebulizer solution USE 1 VIAL ( ) IN NEBULIZER EVERY 6 HOURS AS NEEDED FOR WHEEZING OR SHORTNESS OF BREATH. Patient taking differently: Take 3 mLs by nebulization every 6 (six) hours as needed for wheezing or shortness of breath. 06/28/20   Daphine Deutscher, [name], FNP  isosorbide mononitrate (IMDUR) 30 MG 24 hr tablet TAKE 1 TABLET ONCE DAILY. Patient taking differently: Take 30 mg by mouth daily. 09/04/20   Daphine Deutscher [name], FNP  lamoTRIgine (LAMICTAL) 150 MG tablet Take 1 tablet (150 mg total) by mouth daily. 06/26/20   Daphine Deutscher [name], FNP  Lancets [facility] DELICA PLUS LANCET33G) MISC USE TO CHECK SUGAR DAILY. 06/26/20   Daphine Deutscher, [name], FNP  levETIRAcetam (KEPPRA) 500 MG tablet TAKE (1) TABLET TWICE DAILY. Patient taking differently: Take 500 mg by mouth 2 (two) times daily. 10/29/20   Daphine Deutscher, [name], FNP   levocetirizine (XYZAL) 5 MG tablet TAKE 1 TABLET BY MOUTH EVERY MORNING. Patient taking differently: Take 5 mg by mouth daily as needed for allergies. 09/03/20   Daphine Deutscher, [name], FNP  lidocaine (XYLOCAINE) 5 % ointment APPLY TO AFFECTED AREA 3 TIMES A DAY AS NEEDED FOR MILD OR MODERATE PAIN. Patient taking differently: Apply 1 application topically 3 (three) times daily as needed for mild pain or moderate pain. 10/29/20   Daphine Deutscher, [name], FNP  mupirocin ointment (BACTROBAN) 2 % APPLY TO AFFECTED AREA TWICE DAILY. Patient taking differently: Apply 1 application topically 2 (two) times daily. 06/28/20   Daphine Deutscher, [name], FNP  nitroGLYCERIN (NITROSTAT) 0.4 MG SL tablet PLACE ONE (1) TABLET UNDER TONGUE EVERY 5 MINUTES UP TO (3) DOSES AS NEEDED FOR CHEST PAIN. Patient taking differently: Place 0.4 mg under the tongue every 5 (five) minutes as needed for chest pain. 09/03/20   Daphine Deutscher, [name], FNP  ondansetron (ZOFRAN) 4 MG tablet Take 1 tablet (4 mg total) by mouth every 8 (eight) hours as needed for nausea or vomiting. 11/01/20   Gaynelle Adu, MD  oxybutynin (DITROPAN-XL) 5 MG 24 hr tablet Take 1 tablet (5 mg total) by mouth at bedtime. Patient taking differently: Take 5 mg by mouth every other day. 09/28/20   Daphine Deutscher [name], FNP  Probiotic Product (PROBIOTIC PO) Take 1 capsule by mouth daily.    [provider]  rosuvastatin (CRESTOR) 10 MG tablet Take 1 tablet (10 mg total) by mouth daily. 06/26/20   Daphine Deutscher, [name], FNP  tiZANidine (ZANAFLEX) 4 MG tablet TAKE 1 TABLET EVERY 8 HOURS AS NEEDED FOR MUSCLE SPASMS. Patient taking differently: Take 4 mg by mouth every 8 (eight) hours as needed for muscle spasms. 10/29/20   Hilts, Casimiro Needle, MD  traZODone (  DESYREL) 150 MG tablet Take 1 tablet (150 mg total) by mouth at bedtime. 06/26/20   Daphine Deutscher, [name], FNP  valACYclovir (VALTREX) 1000 MG tablet TAKE 1 TABLET 3 TIMES A DAY. TO BE TAKEN ONLY WHEN SHE HAS OUT  BREAK. Patient taking differently: Take 1,000 mg by mouth 3 (three) times daily as needed (outbreak). 10/29/20   Daphine Deutscher [name], FNP    Allergies    Codeine, Morphine and related, Ambien [zolpidem tartrate], Clonidine derivatives, Metformin and related, Lyrica [pregabalin], and Neurontin [gabapentin]  Review of Systems   Review of Systems  Constitutional:  Negative for diaphoresis and fever.  HENT:  Negative for sore throat.   Eyes:  Negative for visual disturbance.  Respiratory:  Negative for shortness of breath.   Cardiovascular:  Positive for syncope. Negative for chest pain.  Gastrointestinal:  Negative for abdominal pain, nausea and vomiting.  Genitourinary:  Negative for dysuria.  Musculoskeletal:  Positive for gait problem.  Skin:  Negative for rash.  Neurological:  Negative for focal weakness and headaches.   Physical Exam Updated Vital Signs BP (!) 94/51 (BP Location: Left Arm)   Pulse 63   Temp 97.8 F (36.6 C)   Resp 18   Ht 5\' 2"  (1.575 m)   Wt 54.4 kg   SpO2 92%   BMI 21.95 kg/m   Physical Exam Vitals and nursing note reviewed.  Constitutional:      General: She is not in acute distress.    Appearance: Normal appearance. She is well-developed.  HENT:     Head: Normocephalic and atraumatic.  Eyes:     Conjunctiva/sclera: Conjunctivae normal.  Cardiovascular:     Rate and Rhythm: Normal rate and regular rhythm.     Heart sounds: No murmur heard. Pulmonary:     Effort: Pulmonary effort is normal. No respiratory distress.     Breath sounds: Normal breath sounds.  Abdominal:     Palpations: Abdomen is soft.     Tenderness: There is no abdominal tenderness.  Musculoskeletal:        General: Tenderness present. Normal range of motion.     Cervical back: Neck supple.  Skin:    General: Skin is warm and dry.     Capillary Refill: Capillary refill takes less than 2 seconds.  Neurological:     General: No focal deficit present.     Mental Status: She  is alert.    ED Results / Procedures / Treatments   Labs (all labs ordered are listed, but only abnormal results are displayed) Labs Reviewed  COMPREHENSIVE METABOLIC PANEL - Abnormal; Notable for the following components:      Result Value   Potassium 2.6 (*)    Glucose, Bld 112 (*)    BUN 26 (*)    Creatinine, Ser 1.76 (*)    Calcium 8.4 (*)    Total Protein 6.2 (*)    GFR, Estimated 32 (*)    All other components within normal limits  BASIC METABOLIC PANEL - Abnormal; Notable for the following components:   Potassium 3.3 (*)    BUN 24 (*)    Creatinine, Ser 1.07 (*)    Calcium 8.2 (*)    GFR, Estimated 58 (*)    All other components within normal limits  CBC - Abnormal; Notable for the following components:   Platelets 133 (*)    All other components within normal limits  RESP PANEL BY RT-PCR (FLU A&B, COVID) [id number]  CBC  LIPASE, BLOOD  MAGNESIUM  TSH  URINALYSIS, ROUTINE W REFLEX MICROSCOPIC  CBG MONITORING, ED    EKG EKG Interpretation  Date/Time:  Sunday November 25 2020 20:02:42 EDT Ventricular Rate:  66 PR Interval:  169 QRS Duration: 90 QT Interval:  488 QTC Calculation: 512 R Axis:   -33 Text Interpretation: Sinus rhythm Left axis deviation Abnormal R-wave progression, early transition Nonspecific T abnrm, anterolateral leads Prolonged QT interval new t wave inversions since prior 1/21 Confirmed by Meridee Score [phone]) on 11/25/2020 8:10:54 PM  Radiology DG Tibia/Fibula Right  Result Date: 11/25/2020 CLINICAL DATA:  Fall and trauma to the right lower extremity. EXAM: RIGHT TIBIA AND FIBULA - 2 VIEW; RIGHT ANKLE - COMPLETE 3+ VIEW COMPARISON:  None. FINDINGS: There is no evidence of fracture or other focal bone lesions. Soft tissues are unremarkable. IMPRESSION: Negative. Electronically Signed   By: Elgie Collard M.D.   On: 11/25/2020 20:59   DG Ankle Complete Right  Result Date: 11/25/2020 CLINICAL DATA:  Fall and trauma to the right lower  extremity. EXAM: RIGHT TIBIA AND FIBULA - 2 VIEW; RIGHT ANKLE - COMPLETE 3+ VIEW COMPARISON:  None. FINDINGS: There is no evidence of fracture or other focal bone lesions. Soft tissues are unremarkable. IMPRESSION: Negative. Electronically Signed   By: Elgie Collard M.D.   On: 11/25/2020 20:59   CT Head Wo Contrast  Result Date: 11/25/2020 CLINICAL DATA:  Fall down stairs, altered mental status, hypotensive EXAM: CT HEAD WITHOUT CONTRAST CT CERVICAL SPINE WITHOUT CONTRAST TECHNIQUE: Multidetector CT imaging of the head and cervical spine was performed following the standard protocol without intravenous contrast. Multiplanar CT image reconstructions of the cervical spine were also generated. COMPARISON:  None. FINDINGS: CT HEAD FINDINGS Brain: No evidence of acute infarction, hemorrhage, hydrocephalus, extra-axial collection or mass lesion/mass effect. Vascular: No hyperdense vessel or unexpected calcification. Skull: Normal. Negative for fracture or focal lesion. Sinuses/Orbits: The visualized paranasal sinuses are essentially clear. The mastoid air cells are unopacified. Other: None. CT CERVICAL SPINE FINDINGS Alignment: Normal cervical lordosis. Skull base and vertebrae: No acute fracture. No primary bone lesion or focal pathologic process. Soft tissues and spinal canal: No prevertebral fluid or swelling. No visible canal hematoma. Disc levels: Mild multilevel degenerative changes. Spinal canal is patent. Upper chest: Visualized lung apices are clear. Other: Visualized thyroid is unremarkable. IMPRESSION: No evidence of acute intracranial abnormality. Mild cortical atrophy. No evidence of traumatic injury to the cervical spine. Mild degenerative changes. Electronically Signed   By: Charline Bills M.D.   On: 11/25/2020 20:57   CT Cervical Spine Wo Contrast  Result Date: 11/25/2020 CLINICAL DATA:  Fall down stairs, altered mental status, hypotensive EXAM: CT HEAD WITHOUT CONTRAST CT CERVICAL SPINE  WITHOUT CONTRAST TECHNIQUE: Multidetector CT imaging of the head and cervical spine was performed following the standard protocol without intravenous contrast. Multiplanar CT image reconstructions of the cervical spine were also generated. COMPARISON:  None. FINDINGS: CT HEAD FINDINGS Brain: No evidence of acute infarction, hemorrhage, hydrocephalus, extra-axial collection or mass lesion/mass effect. Vascular: No hyperdense vessel or unexpected calcification. Skull: Normal. Negative for fracture or focal lesion. Sinuses/Orbits: The visualized paranasal sinuses are essentially clear. The mastoid air cells are unopacified. Other: None. CT CERVICAL SPINE FINDINGS Alignment: Normal cervical lordosis. Skull base and vertebrae: No acute fracture. No primary bone lesion or focal pathologic process. Soft tissues and spinal canal: No prevertebral fluid or swelling. No visible canal hematoma. Disc levels: Mild multilevel degenerative changes. Spinal canal is patent. Upper chest: Visualized lung apices are clear.  Other: Visualized thyroid is unremarkable. IMPRESSION: No evidence of acute intracranial abnormality. Mild cortical atrophy. No evidence of traumatic injury to the cervical spine. Mild degenerative changes. Electronically Signed   By: Charline Bills M.D.   On: 11/25/2020 20:57    Procedures Procedures   Medications Ordered in ED Medications  sodium chloride 0.9 % bolus 1,000 mL (has no administration in time range)  ondansetron (ZOFRAN) injection 4 mg (has no administration in time range)  fentaNYL (SUBLIMAZE) injection 50 mcg (has no administration in time range)    ED Course  I have reviewed the triage vital signs and the nursing notes.  Pertinent labs & imaging results that were available during my care of the patient were reviewed by me and considered in my medical decision making (see chart for details).  Clinical Course as of 11/26/20 1000  Sun Nov 25, 2020  2158 Patient's labs showing a  new AKI and hypokalemia.  I do not think she will be able to adequately orally replete so we will admit for continued hydration and repletion.  Discussed with [facility] hospitalist Dr. Gust Rung who will evaluate the patient for admission. [MB]    Clinical Course User Index [MB] Terrilee Files, MD   MDM Rules/Calculators/A&P                          This patient complains of poor intake, syncope, right lower leg pain; this involves an extensive number of treatment Options and is a complaint that carries with it a high risk of complications and Morbidity. The differential includes dehydration, metabolic derangement, vasovagal, hypovolemia, arrhythmia  I ordered, reviewed and interpreted labs, which included CBC with normal white count normal hemoglobin, chemistries with low potassium elevated creatinine reflecting some dehydration, COVID testing negative I ordered medication IV fluids IV potassium supplementation I ordered imaging studies which included CT head and cervical spine right tib-fib and ankle x-ray and I independently    visualized and interpreted imaging which showed no acute findings Additional history obtained from EMS Previous records obtained and reviewed in epic including prior operative note I consulted Dr. Adrian Blackwater [facility] hospitalist and discussed lab and imaging findings  Critical Interventions: None  After the interventions stated above, I reevaluated the patient and found patient's blood pressure to be improving.  She will need to be admitted to the hospital for further IV hydration and correction of her metabolic derangement.  Patient is agreeable to plan   Final Clinical Impression(s) / ED Diagnoses Final diagnoses:  Syncope, unspecified syncope type  Dehydration  Hypokalemia  Right leg pain    Rx / DC Orders ED Discharge Orders     None        Terrilee Files, MD 11/26/20 1005

## 2020-11-25 NOTE — ED Notes (Signed)
Critical Value Potassium of 2.6  Reported to EDP Charm Barges and primary RN at this time.

## 2020-11-25 NOTE — ED Triage Notes (Signed)
Pt arrived via [facility] hypotensive (94/50) c/o RLE pain 9/10 with shocking r/t fall down her stairs at home yesterday in which she lost consciousness et states that she hit her head.

## 2020-11-25 NOTE — H&P (Signed)
History and Physical  [name]:[id number] DOB: 1955/11/16 DOA: 11/25/2020  Referring physician: Dr Charm Barges, ED physician PCP: Bennie Pierini, FNP  Outpatient Specialists:   Patient Coming From: home  Chief Complaint: syncope  HPI: [name] is a 65 y.o. female with a history of CHF, diabetes, hypertension, coronary artery disease, hyperlipidemia.  Patient had a hiatal hernia repair done at the beginning of the month.  Today, she presents with decreased appetite and decreased oral food and liquid intake over the past couple of days.  She had 2 syncopal episodes yesterday and 2 more today.  On one of her syncopal episodes, she fell and injured her right leg and reports some calf tenderness.  She denies chest pain, shortness of breath.  No palliating or provoking factors.  Emergency Department Course: Patient initially hypotensive into the 90s.  Blood work shows decreased potassium 2.7.  She was given IV fluids  Review of Systems:   Pt denies any fevers, chills, nausea, vomiting, diarrhea, constipation, abdominal pain, shortness of breath, dyspnea on exertion, orthopnea, cough, wheezing, palpitations, headache, vision changes, lightheadedness, dizziness, melena, rectal bleeding.  Review of systems are otherwise negative  Past Medical History:  Diagnosis Date   Allergy    Anemia    Anginal pain (HCC)    last time    Anxiety    Arthritis    RHEUMATOID   Asthma    Bipolar 1 disorder (HCC)    Blood transfusion without reported diagnosis    Cataracts, bilateral 07/2017   CHF (congestive heart failure) (HCC)    COPD (chronic obstructive pulmonary disease) (HCC)    Coronary artery disease    reported hx of "MI";  Echo 2009 with normal LVF;  Myoview 05/2011: no ischemia   Depression    Diabetes mellitus without complication (HCC)    Dyslipidemia    Dysrhythmia    SVT   Esophageal stricture    Fibromyalgia    GERD (gastroesophageal reflux disease)    H/O  hiatal hernia    Head injury, unspecified    Headache    migraines   Herpes simplex infection    History of kidney stones    History of loop recorder    Managed by Dr. Sharrell Ku   Hyperlipidemia    Hypertension    Insomnia    Myocardial infarction [facility])    age 67   Osteoporosis    Pneumonia    hx   Seizures (HCC)    Shortness of breath    Sleep apnea    ? neg   Spinal stenosis of lumbar region    Spondylolisthesis    Status post placement of implantable loop recorder    Supraventricular tachycardia (HCC)    Syncope and collapse    s/p ILR; no arhythmogenic cause identified   UTI (lower urinary tract infection)    Past Surgical History:  Procedure Laterality Date   BACK SURGERY     BREAST SURGERY     lumpectomy   CARDIAC CATHETERIZATION  10/06/2011   CATARACT EXTRACTION W/PHACO Right 07/31/2017   Procedure: CATARACT EXTRACTION PHACO AND INTRAOCULAR LENS PLACEMENT (IOC);  Surgeon: Fabio Pierce, MD;  Location: [facility];  Service: Ophthalmology;  Laterality: Right;  CDE: 2.33   CATARACT EXTRACTION W/PHACO Left 08/14/2017   Procedure: CATARACT EXTRACTION PHACO AND INTRAOCULAR LENS PLACEMENT (IOC);  Surgeon: Fabio Pierce, MD;  Location: [facility];  Service: Ophthalmology;  Laterality: Left;  CDE: 2.74   CHOLECYSTECTOMY  CYSTOSCOPY     stone   DIAGNOSTIC LAPAROSCOPY     laparoscopic cholecystectomy   DOPPLER ECHOCARDIOGRAPHY  2009   ESOPHAGOGASTRODUODENOSCOPY N/A 10/31/2020   Procedure: ESOPHAGOGASTRODUODENOSCOPY (EGD);  Surgeon: Gaynelle Adu, MD;  Location: [facility];  Service: General;  Laterality: N/A;   head up tilt table testing  06/15/2007   [name]   HEMORRHOID SURGERY     insertion of implatable loop recorder  08/11/2007   [name]   POSTERIOR CERVICAL FUSION/FORAMINOTOMY N/A 12/19/2013   Procedure: RIGHT C3-4.C4-5 AND C5-6 FORAMINOTOMIES;  Surgeon: Kerrin Champagne, MD;  Location: [facility] OR;  Service: Orthopedics;  Laterality: N/A;   TOTAL HIP ARTHROPLASTY Left  01/31/2020   Procedure: LEFT TOTAL HIP ARTHROPLASTY ANTERIOR APPROACH;  Surgeon: Kathryne Hitch, MD;  Location: [facility];  Service: Orthopedics;  Laterality: Left;   TOTAL SHOULDER ARTHROPLASTY Right 11/15/2019   Procedure: RIGHT TOTAL SHOULDER ARTHROPLASTY;  Surgeon: Cammy Copa, MD;  Location: [facility];  Service: Orthopedics;  Laterality: Right;   TUBAL LIGATION     UPPER GASTROINTESTINAL ENDOSCOPY     XI ROBOTIC ASSISTED HIATAL HERNIA REPAIR N/A 10/31/2020   Procedure: XI ROBOTIC ASSISTED HIATAL HERNIA REPAIR WITH PARTIAL FUNDOPLICATION;  Surgeon: Gaynelle Adu, MD;  Location: [facility];  Service: General;  Laterality: N/A;   Social History:  reports that she has never smoked. She has never used smokeless tobacco. She reports that she does not drink alcohol and does not use drugs. Patient lives at home  Allergies  Allergen Reactions   Codeine Other (See Comments)    "I will have a heart attack."   Morphine And Related Other (See Comments)    "It will cause me to have a heart attack."   Ambien [Zolpidem Tartrate] Nausea And Vomiting   Clonidine Derivatives Nausea And Vomiting    gerd - caused acid reflux per pt   Metformin And Related     Nausea/vomiting/cramping from metformin   Lyrica [Pregabalin] Swelling and Other (See Comments)    Weight gain   Neurontin [Gabapentin] Other (See Comments)    Causes elevated LFTs     Family History  Problem Relation Age of Onset   Heart attack Father    Mental illness Father    Mental illness Mother    Heart attack Brother        stents   Alcohol abuse Brother    Heart disease Brother    Drug abuse Brother    Diabetes Brother    Colon cancer Maternal Aunt    Cirrhosis Brother    Stomach cancer Neg Hx    Esophageal cancer Neg Hx    Rectal cancer Neg Hx     Prior to Admission medications   Medication Sig Start Date End Date Taking? Authorizing Provider  acyclovir ointment (ZOVIRAX) 5 % APPLY TOPICALLY EVERY 3 HOURS. Patient  taking differently: Apply 1 application topically every 3 (three) hours as needed (outbreak). 09/27/20   Daphine Deutscher, [name], FNP  albuterol (VENTOLIN HFA) 108 (90 Base) MCG/ACT inhaler INHALE 2 PUFFS EVERY 6 HOURS AS NEEDED FOR SHORTNESS OF BREATH AND WHEEZING. Patient taking differently: Inhale 2 puffs into the lungs every 6 (six) hours as needed for wheezing or shortness of breath. 06/28/20   Daphine Deutscher [name], FNP  Alcohol Swabs (GLOBAL ALCOHOL PREP EASE) 70 % PADS USE 1 PAD DAILY WHEN CHECKING BLOOD SUGAR. R73.03 10/29/20   Daphine Deutscher, [name], FNP  busPIRone (BUSPAR) 10 MG tablet TAKE 1 TABLET BY MOUTH TWICE DAILY AS NEEDED.  Patient taking differently: Take 10 mg by mouth 2 (two) times daily as needed (agitation/anxiety). 10/29/20   Daphine Deutscher, [name], FNP  butalbital-acetaminophen-caffeine (FIORICET) 50-325-40 MG tablet TAKE 1 TABLET BY MOUTH EVERY 6 HOURS AS NEEDED FOR HEADACHE. Patient taking differently: Take 1 tablet by mouth every 6 (six) hours as needed for headache or migraine. 10/29/20   Daphine Deutscher, [name], FNP  carvedilol (COREG) 6.25 MG tablet TAKE (1) TABLET TWICE DAILY. Patient taking differently: 150 10/29/20   Daphine Deutscher, [name], FNP  celecoxib (CELEBREX) 200 MG capsule TAKE 1 CAPSULE BY MOUTH TWICE DAILY BETWEEN MEALS AS NEEDED. Patient taking differently: Take 200 mg by mouth 2 (two) times daily as needed for mild pain. 08/01/20   Kathryne Hitch, MD  dexlansoprazole (DEXILANT) 60 MG capsule Take 1 capsule (60 mg total) by mouth daily. 06/26/20   Daphine Deutscher, [name], FNP  diclofenac Sodium (VOLTAREN) 1 % GEL APPLY 4 GRAMS TOPICALLY 4 TIMES A DAY. Patient taking differently: Apply 4 g topically 4 (four) times daily as needed (joint pain). 10/29/20   Daphine Deutscher [name], FNP  DULoxetine (CYMBALTA) 60 MG capsule Take 1 capsule (60 mg total) by mouth at bedtime. 06/26/20   Daphine Deutscher [name], FNP  empagliflozin (JARDIANCE) 25 MG TABS tablet Take 1 tablet (25 mg  total) by mouth daily before breakfast. 06/26/20   Daphine Deutscher, [name], FNP  escitalopram (LEXAPRO) 20 MG tablet Take 1 tablet (20 mg total) by mouth daily. 06/26/20   Daphine Deutscher, [name], FNP  estradiol (ESTRACE) 0.1 MG/GM vaginal cream Place 1 Applicatorful vaginally at bedtime. Patient not taking: No sig reported 12/02/19   Daphine Deutscher, [name], FNP  FIBER PO Take 2 tablets by mouth in the morning, at noon, and at bedtime.    [provider]  fludrocortisone (FLORINEF) 0.1 MG tablet TAKE 1 TABLET EVERY OTHER DAY. Patient taking differently: Take 0.1 mg by mouth every other day. 10/29/20   Antoine Poche, MD  fluticasone (FLONASE) 50 MCG/ACT nasal spray USE 1 SPRAY IN EACH NOSTRIL ONCE DAILY. Patient taking differently: Place 1 spray into both nostrils daily as needed for allergies. 04/04/20   Daphine Deutscher, [name], FNP  fluticasone furoate-vilanterol (BREO ELLIPTA) 200-25 MCG/INH AEPB Inhale 1 puff into the lungs daily. Patient taking differently: Inhale 1 puff into the lungs daily as needed (wheezing). 06/26/20   Daphine Deutscher, [name], FNP  furosemide (LASIX) 40 MG tablet TAKE 1 TABLET ONCE DAILY AS NEEDED FOR FLUID. Patient taking differently: Take 40 mg by mouth daily as needed for fluid. 10/29/20   Antoine Poche, MD  glucose blood [facility] VERIO) test strip Test BS daily R73.03 06/26/20   Bennie Pierini, FNP  hydrALAZINE (APRESOLINE) 25 MG tablet TAKE 1 TABLET BY MOUTH 3 TIMES DAILY AS NEEDED (FOR SEVERE HYPERTENSION/SYSTOLIC NUMBER 170 OR GREATER). Patient taking differently: Take 25 mg by mouth 3 (three) times daily as needed (systolic bp >170). 10/29/20   Antoine Poche, MD  HYDROcodone-acetaminophen (NORCO) 7.5-325 MG tablet Take 1 tablet by mouth in the morning, at noon, and at bedtime. 11/27/20 12/27/20  Daphine Deutscher, [name], FNP  ipratropium (ATROVENT) 0.02 % nebulizer solution USE 1 VIAL ( ) IN NEBULIZER EVERY 6 HOURS AS NEEDED FOR WHEEZING OR SHORTNESS OF  BREATH. Patient taking differently: Take 3 mLs by nebulization every 6 (six) hours as needed for wheezing or shortness of breath. 06/28/20   Daphine Deutscher, [name], FNP  isosorbide mononitrate (IMDUR) 30 MG 24 hr tablet TAKE 1 TABLET ONCE DAILY. Patient taking differently: Take 30 mg by mouth daily. 09/04/20   Bennie Pierini, FNP  lamoTRIgine (LAMICTAL) 150 MG tablet Take 1 tablet (150 mg total) by mouth daily. 06/26/20   Daphine Deutscher [name], FNP  Lancets [facility] DELICA PLUS LANCET33G) MISC USE TO CHECK SUGAR DAILY. 06/26/20   Daphine Deutscher, [name], FNP  levETIRAcetam (KEPPRA) 500 MG tablet TAKE (1) TABLET TWICE DAILY. Patient taking differently: Take 500 mg by mouth 2 (two) times daily. 10/29/20   Daphine Deutscher, [name], FNP  levocetirizine (XYZAL) 5 MG tablet TAKE 1 TABLET BY MOUTH EVERY MORNING. Patient taking differently: Take 5 mg by mouth daily as needed for allergies. 09/03/20   Daphine Deutscher, [name], FNP  lidocaine (XYLOCAINE) 5 % ointment APPLY TO AFFECTED AREA 3 TIMES A DAY AS NEEDED FOR MILD OR MODERATE PAIN. Patient taking differently: Apply 1 application topically 3 (three) times daily as needed for mild pain or moderate pain. 10/29/20   Daphine Deutscher, [name], FNP  mupirocin ointment (BACTROBAN) 2 % APPLY TO AFFECTED AREA TWICE DAILY. Patient taking differently: Apply 1 application topically 2 (two) times daily. 06/28/20   Daphine Deutscher, [name], FNP  nitroGLYCERIN (NITROSTAT) 0.4 MG SL tablet PLACE ONE (1) TABLET UNDER TONGUE EVERY 5 MINUTES UP TO (3) DOSES AS NEEDED FOR CHEST PAIN. Patient taking differently: Place 0.4 mg under the tongue every 5 (five) minutes as needed for chest pain. 09/03/20   Daphine Deutscher, [name], FNP  ondansetron (ZOFRAN) 4 MG tablet Take 1 tablet (4 mg total) by mouth every 8 (eight) hours as needed for nausea or vomiting. 11/01/20   Gaynelle Adu, MD  oxybutynin (DITROPAN-XL) 5 MG 24 hr tablet Take 1 tablet (5 mg total) by mouth at bedtime. Patient taking  differently: Take 5 mg by mouth every other day. 09/28/20   Daphine Deutscher [name], FNP  Probiotic Product (PROBIOTIC PO) Take 1 capsule by mouth daily.    [provider]  rosuvastatin (CRESTOR) 10 MG tablet Take 1 tablet (10 mg total) by mouth daily. 06/26/20   Daphine Deutscher, [name], FNP  tiZANidine (ZANAFLEX) 4 MG tablet TAKE 1 TABLET EVERY 8 HOURS AS NEEDED FOR MUSCLE SPASMS. Patient taking differently: Take 4 mg by mouth every 8 (eight) hours as needed for muscle spasms. 10/29/20   Hilts, Casimiro Needle, MD  traZODone (DESYREL) 150 MG tablet Take 1 tablet (150 mg total) by mouth at bedtime. 06/26/20   Daphine Deutscher, [name], FNP  valACYclovir (VALTREX) 1000 MG tablet TAKE 1 TABLET 3 TIMES A DAY. TO BE TAKEN ONLY WHEN SHE HAS OUT BREAK. Patient taking differently: Take 1,000 mg by mouth 3 (three) times daily as needed (outbreak). 10/29/20   Bennie Pierini, FNP    Physical Exam: BP 95/62   Pulse 68   Temp 97.8 F (36.6 C)   Resp 18   Ht 5\' 2"  (1.575 m)   Wt 54.4 kg   SpO2 94%   BMI 21.95 kg/m   General: Elderly female. Awake and alert and oriented x3. No acute cardiopulmonary distress.  HEENT: Normocephalic atraumatic.  Right and left ears normal in appearance.  Pupils equal, round, reactive to light. Extraocular muscles are intact. Sclerae anicteric and noninjected.  Moist mucosal membranes. No mucosal lesions.  Neck: Neck supple without lymphadenopathy. No carotid bruits. No masses palpated.  Cardiovascular: Regular rate with normal S1-S2 sounds. No murmurs, rubs, gallops auscultated. No JVD.  Respiratory: Good respiratory effort with no wheezes, rales, rhonchi. Lungs clear to auscultation bilaterally.  No accessory muscle use. Abdomen: Soft, nontender, nondistended. Active bowel sounds. No masses or hepatosplenomegaly  Skin: No rashes, lesions, or ulcerations.  Dry, warm to touch. 2+ dorsalis pedis and radial pulses.  Musculoskeletal: No calf or leg pain. All major joints not  erythematous nontender.  No upper or lower joint deformation.  Good ROM.  No contractures  Psychiatric: Intact judgment and insight. Pleasant and cooperative. Neurologic: No focal neurological deficits. Strength is 5/5 and symmetric in upper and lower extremities.  Cranial nerves II through XII are grossly intact.           Labs on Admission: I have personally reviewed following labs and imaging studies  CBC: Recent Labs  Lab 11/25/20 2023  WBC 9.3  HGB 14.5  HCT 43.5  MCV 95.4  PLT 156   Basic Metabolic Panel: Recent Labs  Lab 11/25/20 2023  NA 137  K 2.6*  CL 106  CO2 24  GLUCOSE 112*  BUN 26*  CREATININE 1.76*  CALCIUM 8.4*  MG 2.1   GFR: Estimated Creatinine Clearance: 25.5 mL/min (A) (by C-G formula based on SCr of 1.76 mg/dL (H)). Liver Function Tests: Recent Labs  Lab 11/25/20 2023  AST 22  ALT 11  ALKPHOS 76  BILITOT 0.5  PROT 6.2*  ALBUMIN 3.8   Recent Labs  Lab 11/25/20 2023  LIPASE 47   No results for input(s): AMMONIA in the last 168 hours. Coagulation Profile: No results for input(s): INR, PROTIME in the last 168 hours. Cardiac Enzymes: No results for input(s): CKTOTAL, CKMB, CKMBINDEX, TROPONINI in the last 168 hours. BNP (last 3 results) No results for input(s): PROBNP in the last 8760 hours. HbA1C: No results for input(s): HGBA1C in the last 72 hours. CBG: No results for input(s): GLUCAP in the last 168 hours. Lipid Profile: No results for input(s): CHOL, HDL, LDLCALC, TRIG, CHOLHDL, LDLDIRECT in the last 72 hours. Thyroid Function Tests: No results for input(s): TSH, T4TOTAL, FREET4, T3FREE, THYROIDAB in the last 72 hours. Anemia Panel: No results for input(s): VITAMINB12, FOLATE, FERRITIN, TIBC, IRON, RETICCTPCT in the last 72 hours. Urine analysis:    Component Value Date/Time   COLORURINE YELLOW 11/08/2019 1146   APPEARANCEUR Clear 09/12/2020 1010   LABSPEC 1.038 (H) 11/08/2019 1146   PHURINE 5.0 11/08/2019 1146   GLUCOSEU  3+ (A) 09/12/2020 1010   HGBUR NEGATIVE 11/08/2019 1146   BILIRUBINUR Negative 09/12/2020 1010   KETONESUR NEGATIVE 11/08/2019 1146   PROTEINUR Negative 09/12/2020 1010   PROTEINUR NEGATIVE 11/08/2019 1146   UROBILINOGEN negative 07/27/2013 1133   UROBILINOGEN 0.2 06/12/2008 1402   NITRITE Negative 09/12/2020 1010   NITRITE NEGATIVE 11/08/2019 1146   LEUKOCYTESUR Trace (A) 09/12/2020 1010   LEUKOCYTESUR TRACE (A) 11/08/2019 1146   Sepsis Labs: (procalcitonin:4,lacticidven:4) )No results found for this or any previous visit (from the past 240 hour(s)).   Radiological Exams on Admission: DG Tibia/Fibula Right  Result Date: 11/25/2020 CLINICAL DATA:  Fall and trauma to the right lower extremity. EXAM: RIGHT TIBIA AND FIBULA - 2 VIEW; RIGHT ANKLE - COMPLETE 3+ VIEW COMPARISON:  None. FINDINGS: There is no evidence of fracture or other focal bone lesions. Soft tissues are unremarkable. IMPRESSION: Negative. Electronically Signed   By: Elgie Collard M.D.   On: 11/25/2020 20:59   DG Ankle Complete Right  Result Date: 11/25/2020 CLINICAL DATA:  Fall and trauma to the right lower extremity. EXAM: RIGHT TIBIA AND FIBULA - 2 VIEW; RIGHT ANKLE - COMPLETE 3+ VIEW COMPARISON:  None. FINDINGS: There is no evidence of fracture or other focal bone lesions. Soft tissues are unremarkable. IMPRESSION: Negative. Electronically Signed   By: Elgie Collard M.D.   On: 11/25/2020 20:59   CT  Head Wo Contrast  Result Date: 11/25/2020 CLINICAL DATA:  Fall down stairs, altered mental status, hypotensive EXAM: CT HEAD WITHOUT CONTRAST CT CERVICAL SPINE WITHOUT CONTRAST TECHNIQUE: Multidetector CT imaging of the head and cervical spine was performed following the standard protocol without intravenous contrast. Multiplanar CT image reconstructions of the cervical spine were also generated. COMPARISON:  None. FINDINGS: CT HEAD FINDINGS Brain: No evidence of acute infarction, hemorrhage, hydrocephalus,  extra-axial collection or mass lesion/mass effect. Vascular: No hyperdense vessel or unexpected calcification. Skull: Normal. Negative for fracture or focal lesion. Sinuses/Orbits: The visualized paranasal sinuses are essentially clear. The mastoid air cells are unopacified. Other: None. CT CERVICAL SPINE FINDINGS Alignment: Normal cervical lordosis. Skull base and vertebrae: No acute fracture. No primary bone lesion or focal pathologic process. Soft tissues and spinal canal: No prevertebral fluid or swelling. No visible canal hematoma. Disc levels: Mild multilevel degenerative changes. Spinal canal is patent. Upper chest: Visualized lung apices are clear. Other: Visualized thyroid is unremarkable. IMPRESSION: No evidence of acute intracranial abnormality. Mild cortical atrophy. No evidence of traumatic injury to the cervical spine. Mild degenerative changes. Electronically Signed   By: Charline Bills M.D.   On: 11/25/2020 20:57   CT Cervical Spine Wo Contrast  Result Date: 11/25/2020 CLINICAL DATA:  Fall down stairs, altered mental status, hypotensive EXAM: CT HEAD WITHOUT CONTRAST CT CERVICAL SPINE WITHOUT CONTRAST TECHNIQUE: Multidetector CT imaging of the head and cervical spine was performed following the standard protocol without intravenous contrast. Multiplanar CT image reconstructions of the cervical spine were also generated. COMPARISON:  None. FINDINGS: CT HEAD FINDINGS Brain: No evidence of acute infarction, hemorrhage, hydrocephalus, extra-axial collection or mass lesion/mass effect. Vascular: No hyperdense vessel or unexpected calcification. Skull: Normal. Negative for fracture or focal lesion. Sinuses/Orbits: The visualized paranasal sinuses are essentially clear. The mastoid air cells are unopacified. Other: None. CT CERVICAL SPINE FINDINGS Alignment: Normal cervical lordosis. Skull base and vertebrae: No acute fracture. No primary bone lesion or focal pathologic process. Soft tissues and  spinal canal: No prevertebral fluid or swelling. No visible canal hematoma. Disc levels: Mild multilevel degenerative changes. Spinal canal is patent. Upper chest: Visualized lung apices are clear. Other: Visualized thyroid is unremarkable. IMPRESSION: No evidence of acute intracranial abnormality. Mild cortical atrophy. No evidence of traumatic injury to the cervical spine. Mild degenerative changes. Electronically Signed   By: Charline Bills M.D.   On: 11/25/2020 20:57    EKG: Independently reviewed.  Sinus rhythm with left axis deviation.  Early R wave progression.  No acute ST changes.  Assessment/Plan: Active Problems:   Essential hypertension   GERD   Postlaminectomy syndrome, lumbar region   GAD (generalized anxiety disorder)   Pre-diabetes   Chronic diastolic CHF (congestive heart failure) (HCC)   Syncope   Hypokalemia    This patient was discussed with the ED physician, including pertinent vitals, physical exam findings, labs, and imaging.  We also discussed care given by the ED provider.  Syncopal episode Observation on telemetry Echocardiogram IV fluids This may be due to hypotension.  Will hold antihypertensives for now. Recheck labs in the morning Hypokalemia Recheck potassium in the morning Replace potassium Chronic diastolic heart failure Appears compensated.  Hold diuretic Diabetes Hold oral hypoglycemic.  CBGs before meals and nightly with sliding scale Hypertension Hold antihypertensives as above GERD PPI Anxiety  DVT prophylaxis: SCDs as patient high fall risk Consultants: None Code Status: Full code Family Communication: none Disposition Plan: Patient should be able to return home following  evaluation   Levie Heritage, DO

## 2020-11-26 ENCOUNTER — Observation Stay (HOSPITAL_BASED_OUTPATIENT_CLINIC_OR_DEPARTMENT_OTHER): Payer: 59

## 2020-11-26 ENCOUNTER — Other Ambulatory Visit: Payer: Self-pay

## 2020-11-26 DIAGNOSIS — R55 Syncope and collapse: Secondary | ICD-10-CM

## 2020-11-26 LAB — ECHOCARDIOGRAM COMPLETE
Area-P 1/2: 3.72 cm2
Height: 62 in
P 1/2 time: 382 msec
S' Lateral: 2.2 cm
Weight: 2014.12 oz

## 2020-11-26 LAB — CBC
HCT: 42.5 % (ref 36.0–46.0)
Hemoglobin: 14 g/dL (ref 12.0–15.0)
MCH: 31.7 pg (ref 26.0–34.0)
MCHC: 32.9 g/dL (ref 30.0–36.0)
MCV: 96.2 fL (ref 80.0–100.0)
Platelets: 133 10*3/uL — ABNORMAL LOW (ref 150–400)
RBC: 4.42 MIL/uL (ref 3.87–5.11)
RDW: 13.1 % (ref 11.5–15.5)
WBC: 8.4 10*3/uL (ref 4.0–10.5)
nRBC: 0 % (ref 0.0–0.2)

## 2020-11-26 LAB — BASIC METABOLIC PANEL
Anion gap: 7 (ref 5–15)
BUN: 24 mg/dL — ABNORMAL HIGH (ref 8–23)
CO2: 24 mmol/L (ref 22–32)
Calcium: 8.2 mg/dL — ABNORMAL LOW (ref 8.9–10.3)
Chloride: 109 mmol/L (ref 98–111)
Creatinine, Ser: 1.07 mg/dL — ABNORMAL HIGH (ref 0.44–1.00)
GFR, Estimated: 58 mL/min — ABNORMAL LOW (ref >60)
Glucose, Bld: 91 mg/dL (ref 70–99)
Potassium: 3.3 mmol/L — ABNORMAL LOW (ref 3.5–5.1)
Sodium: 140 mmol/L (ref 135–145)

## 2020-11-26 LAB — RESP PANEL BY RT-PCR (FLU A&B, COVID) ARPGX2
Influenza A by PCR: NEGATIVE
Influenza B by PCR: NEGATIVE
SARS Coronavirus 2 by RT PCR: NEGATIVE

## 2020-11-26 LAB — TSH: TSH: 0.597 u[IU]/mL (ref 0.350–4.500)

## 2020-11-26 LAB — MRSA NEXT GEN BY PCR, NASAL: MRSA by PCR Next Gen: NOT DETECTED

## 2020-11-26 LAB — GLUCOSE, CAPILLARY
Glucose-Capillary: 115 mg/dL — ABNORMAL HIGH (ref 70–99)
Glucose-Capillary: 108 mg/dL — ABNORMAL HIGH (ref 70–99)
Glucose-Capillary: 97 mg/dL (ref 70–99)

## 2020-11-26 LAB — CBG MONITORING, ED: Glucose-Capillary: 85 mg/dL (ref 70–99)

## 2020-11-26 MED ORDER — BUSPIRONE HCL 5 MG PO TABS
10.0000 mg | ORAL_TABLET | Freq: Two times a day (BID) | ORAL | Status: DC | PRN
  Administered 2020-11-26: 10 mg via ORAL
  Filled 2020-11-26 (×2): qty 2

## 2020-11-26 MED ORDER — POTASSIUM CHLORIDE IN NACL 20-0.9 MEQ/L-% IV SOLN
INTRAVENOUS | Status: DC
  Filled 2020-11-26: qty 1000

## 2020-11-26 MED ORDER — FLUDROCORTISONE ACETATE 0.1 MG PO TABS
0.1000 mg | ORAL_TABLET | ORAL | Status: DC
  Filled 2020-11-26 (×2): qty 1

## 2020-11-26 MED ORDER — HYDRALAZINE HCL 20 MG/ML IJ SOLN
10.0000 mg | INTRAMUSCULAR | Status: DC | PRN
  Administered 2020-11-26 (×2): 10 mg via INTRAVENOUS
  Filled 2020-11-26 (×2): qty 1

## 2020-11-26 MED ORDER — INSULIN ASPART 100 UNIT/ML IJ SOLN
0.0000 [IU] | Freq: Three times a day (TID) | INTRAMUSCULAR | Status: DC
  Administered 2020-11-27: 3 [IU] via SUBCUTANEOUS

## 2020-11-26 MED ORDER — LORAZEPAM 2 MG/ML IJ SOLN
0.5000 mg | Freq: Four times a day (QID) | INTRAMUSCULAR | Status: DC | PRN
  Administered 2020-11-26 (×2): 0.5 mg via INTRAVENOUS
  Filled 2020-11-26 (×2): qty 1

## 2020-11-26 MED ORDER — ONDANSETRON HCL 4 MG/2ML IJ SOLN
4.0000 mg | Freq: Four times a day (QID) | INTRAMUSCULAR | Status: DC | PRN
  Administered 2020-11-26 – 2020-11-27 (×3): 4 mg via INTRAVENOUS
  Filled 2020-11-26 (×3): qty 2

## 2020-11-26 MED ORDER — INSULIN ASPART 100 UNIT/ML IJ SOLN: 0.0000 [IU] | Freq: Every day | INTRAMUSCULAR | Status: DC

## 2020-11-26 MED ORDER — TRAZODONE HCL 50 MG PO TABS
150.0000 mg | ORAL_TABLET | Freq: Every day | ORAL | Status: DC
  Administered 2020-11-26: 150 mg via ORAL
  Filled 2020-11-26: qty 3

## 2020-11-26 MED ORDER — HYDROCODONE-ACETAMINOPHEN 7.5-325 MG PO TABS
1.0000 | ORAL_TABLET | ORAL | Status: DC | PRN
Start: 2020-11-26 — End: 2020-11-27
  Administered 2020-11-26 (×2): 1 via ORAL
  Filled 2020-11-26 (×2): qty 1

## 2020-11-26 MED ORDER — FIBER PO POWD: 2.0000 | Freq: Three times a day (TID) | ORAL | Status: DC

## 2020-11-26 MED ORDER — SODIUM CHLORIDE 0.9% FLUSH
3.0000 mL | Freq: Two times a day (BID) | INTRAVENOUS | Status: DC
  Administered 2020-11-26 – 2020-11-27 (×3): 3 mL via INTRAVENOUS

## 2020-11-26 MED ORDER — CARVEDILOL 3.125 MG PO TABS
6.2500 mg | ORAL_TABLET | Freq: Two times a day (BID) | ORAL | Status: DC
  Administered 2020-11-26 (×2): 6.25 mg via ORAL
  Filled 2020-11-26 (×2): qty 2

## 2020-11-26 MED ORDER — FLUTICASONE FUROATE-VILANTEROL 200-25 MCG/INH IN AEPB
1.0000 | INHALATION_SPRAY | Freq: Every day | RESPIRATORY_TRACT | Status: DC
  Administered 2020-11-27: 1 via RESPIRATORY_TRACT
  Filled 2020-11-26 (×2): qty 28

## 2020-11-26 MED ORDER — LAMOTRIGINE 150 MG PO TABS
150.0000 mg | ORAL_TABLET | Freq: Every day | ORAL | Status: DC
  Administered 2020-11-26 – 2020-11-27 (×2): 150 mg via ORAL
  Filled 2020-11-26 (×2): qty 1

## 2020-11-26 MED ORDER — ESCITALOPRAM OXALATE 10 MG PO TABS
20.0000 mg | ORAL_TABLET | Freq: Every day | ORAL | Status: DC
  Administered 2020-11-26 – 2020-11-27 (×2): 20 mg via ORAL
  Filled 2020-11-26 (×2): qty 2

## 2020-11-26 MED ORDER — ROSUVASTATIN CALCIUM 10 MG PO TABS
10.0000 mg | ORAL_TABLET | Freq: Every evening | ORAL | Status: DC
  Administered 2020-11-26: 10 mg via ORAL
  Filled 2020-11-26: qty 1

## 2020-11-26 MED ORDER — LEVETIRACETAM 500 MG PO TABS
500.0000 mg | ORAL_TABLET | Freq: Two times a day (BID) | ORAL | Status: DC
  Administered 2020-11-26 – 2020-11-27 (×3): 500 mg via ORAL
  Filled 2020-11-26 (×3): qty 1

## 2020-11-26 MED ORDER — POTASSIUM CHLORIDE 2 MEQ/ML IV SOLN
INTRAVENOUS | Status: DC
  Filled 2020-11-26 (×2): qty 1000

## 2020-11-26 MED ORDER — CHLORHEXIDINE GLUCONATE CLOTH 2 % EX PADS
6.0000 | MEDICATED_PAD | Freq: Every day | CUTANEOUS | Status: DC
  Administered 2020-11-26 – 2020-11-27 (×2): 6 via TOPICAL

## 2020-11-26 MED ORDER — ISOSORBIDE MONONITRATE ER 30 MG PO TB24
30.0000 mg | ORAL_TABLET | Freq: Every day | ORAL | Status: DC
  Administered 2020-11-26 – 2020-11-27 (×2): 30 mg via ORAL
  Filled 2020-11-26 (×2): qty 1

## 2020-11-26 MED ORDER — DULOXETINE HCL 60 MG PO CPEP
60.0000 mg | ORAL_CAPSULE | Freq: Every day | ORAL | Status: DC
  Administered 2020-11-26: 60 mg via ORAL
  Filled 2020-11-26: qty 1

## 2020-11-26 MED ORDER — PANTOPRAZOLE SODIUM 40 MG PO TBEC
40.0000 mg | DELAYED_RELEASE_TABLET | Freq: Every day | ORAL | Status: DC
  Administered 2020-11-26 – 2020-11-27 (×2): 40 mg via ORAL
  Filled 2020-11-26 (×2): qty 1

## 2020-11-26 NOTE — ED Notes (Signed)
Shift report received from [facility] RN

## 2020-11-26 NOTE — Progress Notes (Signed)
*  PRELIMINARY RESULTS* Echocardiogram 2D Echocardiogram has been performed.  Stacey Drain 11/26/2020, 3:03 PM

## 2020-11-26 NOTE — Progress Notes (Signed)
PROGRESS NOTE    [name]  JQB:[id number] DOB: 12-28-1955 DOA: 11/25/2020 PCP: Bennie Pierini, FNP   Brief Narrative:   Per HPI:  [name] is a 65 y.o. female with a history of CHF, diabetes, hypertension, coronary artery disease, hyperlipidemia.  Patient had a hiatal hernia repair done at the beginning of the month.  Today, she presents with decreased appetite and decreased oral food and liquid intake over the past couple of days.  She had 2 syncopal episodes yesterday and 2 more today. On one of her syncopal episodes, she fell and injured her right leg and reports some calf tenderness.  She denies chest pain, shortness of breath.  No palliating or provoking factors.  -Patient has been admitted for evaluation of syncopal episode and is noted to have some ongoing nausea with decreased appetite.  Her blood pressures have been quite elevated with some anxiety noted as well.  Assessment & Plan:   Active Problems:   Essential hypertension   GERD   Postlaminectomy syndrome, lumbar region   GAD (generalized anxiety disorder)   Pre-diabetes   Chronic diastolic CHF (congestive heart failure) (HCC)   Syncope   Hypokalemia   Recurrent syncopal episodes in the setting of poor p.o. intake -2D echocardiogram ordered and pending -Hold further IV fluid given elevated blood pressure  Hypertensive crisis -Hydralazine IV as needed -Ativan as needed for anxiety  AKI -Improving with IV fluid administration -Hold further fluids for now and recheck labs in a.m. -Baseline creatinine near 0.6  Hypokalemia -Replete and reevaluate in a.m.  Chronic diastolic heart failure -Appears compensated, hold diuresis  Diabetes-controlled -Continue to monitor  GERD -PPI  Anxiety -Ativan as needed   DVT prophylaxis: SCDs Code Status: Full Family Communication: None at bedside Disposition Plan:  Status is: Observation  The patient will require care spanning > 2  midnights and should be moved to inpatient because: Hemodynamically unstable and Ongoing diagnostic testing needed not appropriate for outpatient work up  Dispo: The patient is from: Home              Anticipated d/c is to: Home              Patient currently is not medically stable to d/c.   Difficult to place patient No   Consultants:  None  Procedures:  See below  Antimicrobials:  None   Subjective: Patient seen and evaluated today with no new acute complaints or concerns. No acute concerns or events noted overnight.  She is noted to have some ongoing anxiety and elevated blood pressures.  Objective: Vitals:   11/26/20 1200 11/26/20 1400 11/26/20 1415 11/26/20 1430  BP: (!) 221/116   (!) 222/110  Pulse: 73 72 84 76  Resp: (!) 30 19 (!) 21 (!) 34  Temp:      TempSrc:      SpO2: 100% 100% 99% 99%  Weight:      Height:        Intake/Output Summary (Last 24 hours) at 11/26/2020 1500 Last data filed at 11/26/2020 0259 Gross per 24 hour  Intake 2266.32 ml  Output --  Net 2266.32 ml   Filed Weights   11/25/20 1952 11/26/20 1126  Weight: 54.4 kg 57.1 kg    Examination:  General exam: Appears calm and comfortable  Respiratory system: Clear to auscultation. Respiratory effort normal. Cardiovascular system: S1 & S2 heard, RRR.  Gastrointestinal system: Abdomen is soft Central nervous system: Alert and awake Extremities: No edema Skin: No  significant lesions noted Psychiatry: Flat affect.    Data Reviewed: I have personally reviewed following labs and imaging studies  CBC: Recent Labs  Lab 11/25/20 2023 11/26/20 0452  WBC 9.3 8.4  HGB 14.5 14.0  HCT 43.5 42.5  MCV 95.4 96.2  PLT 156 133*   Basic Metabolic Panel: Recent Labs  Lab 11/25/20 2023 11/26/20 0452  NA 137 140  K 2.6* 3.3*  CL 106 109  CO2 24 24  GLUCOSE 112* 91  BUN 26* 24*  CREATININE 1.76* 1.07*  CALCIUM 8.4* 8.2*  MG 2.1  --    GFR: Estimated Creatinine Clearance: 42 mL/min (A)  (by C-G formula based on SCr of 1.07 mg/dL (H)). Liver Function Tests: Recent Labs  Lab 11/25/20 2023  AST 22  ALT 11  ALKPHOS 76  BILITOT 0.5  PROT 6.2*  ALBUMIN 3.8   Recent Labs  Lab 11/25/20 2023  LIPASE 47   No results for input(s): AMMONIA in the last 168 hours. Coagulation Profile: No results for input(s): INR, PROTIME in the last 168 hours. Cardiac Enzymes: No results for input(s): CKTOTAL, CKMB, CKMBINDEX, TROPONINI in the last 168 hours. BNP (last 3 results) No results for input(s): PROBNP in the last 8760 hours. HbA1C: No results for input(s): HGBA1C in the last 72 hours. CBG: Recent Labs  Lab 11/26/20 0714  GLUCAP 85   Lipid Profile: No results for input(s): CHOL, HDL, LDLCALC, TRIG, CHOLHDL, LDLDIRECT in the last 72 hours. Thyroid Function Tests: Recent Labs    11/26/20 0452  TSH 0.597   Anemia Panel: No results for input(s): VITAMINB12, FOLATE, FERRITIN, TIBC, IRON, RETICCTPCT in the last 72 hours. Sepsis Labs: No results for input(s): PROCALCITON, LATICACIDVEN in the last 168 hours.  Recent Results (from the past 240 hour(s))  Resp Panel by RT-PCR (Flu A&B, Covid) Nasopharyngeal Swab     Status: None   Collection Time: 11/26/20  6:10 AM   Specimen: Nasopharyngeal Swab; Nasopharyngeal(NP) swabs in vial transport medium  Result Value Ref Range Status   SARS Coronavirus 2 by RT PCR NEGATIVE NEGATIVE Final    Comment: (NOTE) SARS-CoV-2 target nucleic acids are NOT DETECTED.  The SARS-CoV-2 RNA is generally detectable in upper respiratory specimens during the acute phase of infection. The lowest concentration of SARS-CoV-2 viral copies this assay can detect is 138 copies/mL. A negative result does not preclude SARS-Cov-2 infection and should not be used as the sole basis for treatment or other patient management decisions. A negative result may occur with  improper specimen collection/handling, submission of specimen other than nasopharyngeal  swab, presence of viral mutation(s) within the areas targeted by this assay, and inadequate number of viral copies(<138 copies/mL). A negative result must be combined with clinical observations, patient history, and epidemiological information. The expected result is Negative.  Fact Sheet for Patients:  BloggerCourse.com  Fact Sheet for Healthcare Providers:  SeriousBroker.it  This test is no t yet approved or cleared by the [place] FDA and  has been authorized for detection and/or diagnosis of SARS-CoV-2 by FDA under an Emergency Use Authorization (EUA). This EUA will remain  in effect (meaning this test can be used) for the duration of the COVID-19 declaration under Section 564(b)(1) of the Act, 21 U.S.C.section 360bbb-3(b)(1), unless the authorization is terminated  or revoked sooner.       Influenza A by PCR NEGATIVE NEGATIVE Final   Influenza B by PCR NEGATIVE NEGATIVE Final    Comment: (NOTE) The Xpert Xpress SARS-CoV-2/FLU/RSV plus assay  is intended as an aid in the diagnosis of influenza from Nasopharyngeal swab specimens and should not be used as a sole basis for treatment. Nasal washings and aspirates are unacceptable for Xpert Xpress SARS-CoV-2/FLU/RSV testing.  Fact Sheet for Patients: BloggerCourse.com  Fact Sheet for Healthcare Providers: SeriousBroker.it  This test is not yet approved or cleared by the [place] FDA and has been authorized for detection and/or diagnosis of SARS-CoV-2 by FDA under an Emergency Use Authorization (EUA). This EUA will remain in effect (meaning this test can be used) for the duration of the COVID-19 declaration under Section 564(b)(1) of the Act, 21 U.S.C. section 360bbb-3(b)(1), unless the authorization is terminated or revoked.  Performed at [facility], [address], [place], [place] [address]   MRSA Next Gen by  PCR, Nasal     Status: None   Collection Time: 11/26/20 11:39 AM   Specimen: Nasal Mucosa; Nasal Swab  Result Value Ref Range Status   MRSA by PCR Next Gen NOT DETECTED NOT DETECTED Final    Comment: (NOTE) The GeneXpert MRSA Assay (FDA approved for NASAL specimens only), is one component of a comprehensive MRSA colonization surveillance program. It is not intended to diagnose MRSA infection nor to guide or monitor treatment for MRSA infections. Test performance is not FDA approved in patients less than 68 years old. Performed at [facility] [address] [place], [place] [address]          Radiology Studies: DG Tibia/Fibula Right  Result Date: 11/25/2020 CLINICAL DATA:  Fall and trauma to the right lower extremity. EXAM: RIGHT TIBIA AND FIBULA - 2 VIEW; RIGHT ANKLE - COMPLETE 3+ VIEW COMPARISON:  None. FINDINGS: There is no evidence of fracture or other focal bone lesions. Soft tissues are unremarkable. IMPRESSION: Negative. Electronically Signed   By: Elgie Collard M.D.   On: 11/25/2020 20:59   DG Ankle Complete Right  Result Date: 11/25/2020 CLINICAL DATA:  Fall and trauma to the right lower extremity. EXAM: RIGHT TIBIA AND FIBULA - 2 VIEW; RIGHT ANKLE - COMPLETE 3+ VIEW COMPARISON:  None. FINDINGS: There is no evidence of fracture or other focal bone lesions. Soft tissues are unremarkable. IMPRESSION: Negative. Electronically Signed   By: Elgie Collard M.D.   On: 11/25/2020 20:59   CT Head Wo Contrast  Result Date: 11/25/2020 CLINICAL DATA:  Fall down stairs, altered mental status, hypotensive EXAM: CT HEAD WITHOUT CONTRAST CT CERVICAL SPINE WITHOUT CONTRAST TECHNIQUE: Multidetector CT imaging of the head and cervical spine was performed following the standard protocol without intravenous contrast. Multiplanar CT image reconstructions of the cervical spine were also generated. COMPARISON:  None. FINDINGS: CT HEAD FINDINGS Brain: No evidence of acute infarction, hemorrhage,  hydrocephalus, extra-axial collection or mass lesion/mass effect. Vascular: No hyperdense vessel or unexpected calcification. Skull: Normal. Negative for fracture or focal lesion. Sinuses/Orbits: The visualized paranasal sinuses are essentially clear. The mastoid air cells are unopacified. Other: None. CT CERVICAL SPINE FINDINGS Alignment: Normal cervical lordosis. Skull base and vertebrae: No acute fracture. No primary bone lesion or focal pathologic process. Soft tissues and spinal canal: No prevertebral fluid or swelling. No visible canal hematoma. Disc levels: Mild multilevel degenerative changes. Spinal canal is patent. Upper chest: Visualized lung apices are clear. Other: Visualized thyroid is unremarkable. IMPRESSION: No evidence of acute intracranial abnormality. Mild cortical atrophy. No evidence of traumatic injury to the cervical spine. Mild degenerative changes. Electronically Signed   By: Charline Bills M.D.   On: 11/25/2020 20:57  CT Cervical Spine Wo Contrast  Result Date: 11/25/2020 CLINICAL DATA:  Fall down stairs, altered mental status, hypotensive EXAM: CT HEAD WITHOUT CONTRAST CT CERVICAL SPINE WITHOUT CONTRAST TECHNIQUE: Multidetector CT imaging of the head and cervical spine was performed following the standard protocol without intravenous contrast. Multiplanar CT image reconstructions of the cervical spine were also generated. COMPARISON:  None. FINDINGS: CT HEAD FINDINGS Brain: No evidence of acute infarction, hemorrhage, hydrocephalus, extra-axial collection or mass lesion/mass effect. Vascular: No hyperdense vessel or unexpected calcification. Skull: Normal. Negative for fracture or focal lesion. Sinuses/Orbits: The visualized paranasal sinuses are essentially clear. The mastoid air cells are unopacified. Other: None. CT CERVICAL SPINE FINDINGS Alignment: Normal cervical lordosis. Skull base and vertebrae: No acute fracture. No primary bone lesion or focal pathologic process. Soft  tissues and spinal canal: No prevertebral fluid or swelling. No visible canal hematoma. Disc levels: Mild multilevel degenerative changes. Spinal canal is patent. Upper chest: Visualized lung apices are clear. Other: Visualized thyroid is unremarkable. IMPRESSION: No evidence of acute intracranial abnormality. Mild cortical atrophy. No evidence of traumatic injury to the cervical spine. Mild degenerative changes. Electronically Signed   By: Charline Bills M.D.   On: 11/25/2020 20:57        Scheduled Meds:  carvedilol  6.25 mg Oral BID WC   Chlorhexidine Gluconate Cloth  6 each Topical Daily   DULoxetine  60 mg Oral QHS   escitalopram  20 mg Oral Daily   fludrocortisone  0.1 mg Oral QODAY   fluticasone furoate-vilanterol  1 puff Inhalation Daily   insulin aspart  0-15 Units Subcutaneous TID WC   insulin aspart  0-5 Units Subcutaneous QHS   isosorbide mononitrate  30 mg Oral Daily   lamoTRIgine  150 mg Oral Daily   levETIRAcetam  500 mg Oral BID   pantoprazole  40 mg Oral Daily   rosuvastatin  10 mg Oral QPM   sodium chloride flush  3 mL Intravenous Q12H   traZODone  150 mg Oral QHS     LOS: 0 days    Time spent: 35 minutes    Chez Bulnes Hoover Brunette, DO [facility]  If 7PM-7AM, please contact night-coverage [email or web address] 11/26/2020, 3:00 PM

## 2020-11-26 NOTE — Plan of Care (Signed)
  Problem: Acute Rehab PT Goals(only PT should resolve) Goal: Pt Will Go Supine/Side To Sit Outcome: Progressing Flowsheets (Taken 11/26/2020 1015) Pt will go Supine/Side to Sit:  Independently  with modified independence Goal: Patient Will Transfer Sit To/From Stand Outcome: Progressing Flowsheets (Taken 11/26/2020 1015) Patient will transfer sit to/from stand:  with modified independence  Independently Goal: Pt Will Transfer Bed To Chair/Chair To Bed Outcome: Progressing Flowsheets (Taken 11/26/2020 1015) Pt will Transfer Bed to Chair/Chair to Bed:  with modified independence  Independently Goal: Pt Will Ambulate Outcome: Progressing Flowsheets (Taken 11/26/2020 1015) Pt will Ambulate:  with modified independence  100 feet  with rolling walker  with least restrictive assistive device   10:16 AM, 11/26/20 Ocie Bob, MPT Physical Therapist with [facility] [phone] office [phone] mobile phone

## 2020-11-26 NOTE — Evaluation (Signed)
Physical Therapy Evaluation Patient Details Name: [name] MRN: [id number] DOB: May 24, 1955 Today's Date: 11/26/2020   History of Present Illness  [name] is a 65 y.o. female with a history of CHF, diabetes, hypertension, coronary artery disease, hyperlipidemia.  Patient had a hiatal hernia repair done at the beginning of the month.  Today, she presents with decreased appetite and decreased oral food and liquid intake over the past couple of days.  She had 2 syncopal episodes yesterday and 2 more today.  On one of her syncopal episodes, she fell and injured her right leg and reports some calf tenderness.  She denies chest pain, shortness of breath.  No palliating or provoking factors.   Clinical Impression  Patient tends to lean on nearby objects when taking steps without AD due to right ankle pain, required use of RW for safety and demonstrates good return for ambulation in room and hallway without loss of balance, able to transfer to commode in bathroom Mod Independent and demonstrated good return for getting into/out of bed. Patient will benefit from continued physical therapy in hospital and recommended venue below to increase strength, balance, endurance for safe ADLs and gait.     Follow Up Recommendations Home health PT;Supervision - Intermittent    Equipment Recommendations  None recommended by PT    Recommendations for Other Services       Precautions / Restrictions Precautions Precautions: Fall Restrictions Weight Bearing Restrictions: No      Mobility  Bed Mobility Overal bed mobility: Modified Independent                  Transfers Overall transfer level: Modified independent                  Ambulation/Gait Ambulation/Gait assistance: Supervision;Min guard Gait Distance (Feet): 45 Feet Assistive device: Rolling walker (2 wheeled) Gait Pattern/deviations: Decreased step length - right;Decreased step length - left;Decreased stance  time - left;Antalgic Gait velocity: decreased   General Gait Details: slow labored cadence with tendency to lean on nearby objects for support, safer using RW without loss of balance, limited mostly due to c/o right ankle pain  Stairs            Wheelchair Mobility    Modified Rankin (Stroke Patients Only)       Balance Overall balance assessment: Needs assistance Sitting-balance support: Feet supported;No upper extremity supported Sitting balance-Leahy Scale: Good Sitting balance - Comments: seated at EOB   Standing balance support: During functional activity;No upper extremity supported Standing balance-Leahy Scale: Poor Standing balance comment: fair/poor without AD, fair/good using RW                             Pertinent Vitals/Pain Pain Assessment: Faces Faces Pain Scale: Hurts little more Pain Location: right ankle Pain Descriptors / Indicators: Sore;Discomfort Pain Intervention(s): Limited activity within patient's tolerance;Monitored during session;Repositioned    Home Living Family/patient expects to be discharged to:: Private residence Living Arrangements: Alone Available Help at Discharge: Family;Available 24 hours/day Type of Home: Apartment Home Access: Level entry     Home Layout: Multi-level;Able to live on main level with bedroom/bathroom;Full bath on main level Home Equipment: Walker - 2 wheels;Cane - single point;Shower seat      Prior Function Level of Independence: Independent with assistive device(s)         Comments: Community ambulator using RW or SPC PRN, drives     Hand Dominance  Dominant Hand: Right    Extremity/Trunk Assessment   Upper Extremity Assessment Upper Extremity Assessment: Overall WFL for tasks assessed    Lower Extremity Assessment Lower Extremity Assessment: Generalized weakness;RLE deficits/detail RLE Deficits / Details: grossly 4/5 RLE: Unable to fully assess due to pain RLE Sensation:  WNL RLE Coordination: WNL    Cervical / Trunk Assessment Cervical / Trunk Assessment: Normal  Communication   Communication: No difficulties  Cognition Arousal/Alertness: Awake/alert Behavior During Therapy: WFL for tasks assessed/performed Overall Cognitive Status: Within Functional Limits for tasks assessed                                        General Comments      Exercises     Assessment/Plan    PT Assessment Patient needs continued PT services  PT Problem List Decreased strength;Decreased activity tolerance;Decreased balance;Decreased mobility;Pain       PT Treatment Interventions DME instruction;Gait training;Functional mobility training;Therapeutic activities;Therapeutic exercise;Balance training;Patient/family education;Wheelchair mobility training    PT Goals (Current goals can be found in the Care Plan section)  Acute Rehab PT Goals Patient Stated Goal: return home with family to assist PT Goal Formulation: With patient Time For Goal Achievement: 11/28/20 Potential to Achieve Goals: Good    Frequency Min 3X/week   Barriers to discharge        Co-evaluation               AM-PAC PT "6 Clicks" Mobility  Outcome Measure Help needed turning from your back to your side while in a flat bed without using bedrails?: None Help needed moving from lying on your back to sitting on the side of a flat bed without using bedrails?: None Help needed moving to and from a bed to a chair (including a wheelchair)?: A Little Help needed standing up from a chair using your arms (e.g., wheelchair or bedside chair)?: A Little Help needed to walk in hospital room?: A Little Help needed climbing 3-5 steps with a railing? : A Lot 6 Click Score: 19    End of Session   Activity Tolerance: Patient tolerated treatment well;Patient limited by fatigue;Patient limited by pain Patient left: in bed;with call bell/phone within reach Nurse Communication: Mobility  status PT Visit Diagnosis: Unsteadiness on feet (R26.81);Other abnormalities of gait and mobility (R26.89);Muscle weakness (generalized) (M62.81)    Time: 1517-6160 PT Time Calculation (min) (ACUTE ONLY): 20 min   Charges:   PT Evaluation $PT Eval Moderate Complexity: 1 Mod PT Treatments $Therapeutic Activity: 8-22 mins        10:14 AM, 11/26/20 Ocie Bob, MPT Physical Therapist with [facility] 336 [phone] office [phone] mobile phone

## 2020-11-26 NOTE — TOC Progression Note (Signed)
Transition of Care [facility]) - Progression Note    Patient Details  Name: [name] MRN: [id number] Date of Birth: Nov 03, 1955  [facility]) CM/SW Contact  Leitha Bleak, RN Phone Number: 11/26/2020, 1:33 PM  Clinical Narrative:   Patient admitted with syncope. PT is recommending HHPT.  [employer] insurance. Linda with [facility] accepted for HHRN/PT.  Added to AVS.   Expected Discharge Plan: Home w Home Health Services Barriers to Discharge: Continued Medical Work up  Expected Discharge Plan and Services Expected Discharge Plan: Home w Home Health Services

## 2020-11-26 NOTE — ED Notes (Signed)
Attending at bedside. States that if bp remains within normal range/high and pt is not orthostatic we can attempt to restart bp meds that have been held.

## 2020-11-27 ENCOUNTER — Other Ambulatory Visit: Payer: Self-pay | Admitting: Nurse Practitioner

## 2020-11-27 DIAGNOSIS — G44229 Chronic tension-type headache, not intractable: Secondary | ICD-10-CM

## 2020-11-27 DIAGNOSIS — A6004 Herpesviral vulvovaginitis: Secondary | ICD-10-CM

## 2020-11-27 DIAGNOSIS — R55 Syncope and collapse: Secondary | ICD-10-CM | POA: Diagnosis not present

## 2020-11-27 DIAGNOSIS — B009 Herpesviral infection, unspecified: Secondary | ICD-10-CM

## 2020-11-27 LAB — BASIC METABOLIC PANEL
Anion gap: 8 (ref 5–15)
BUN: 20 mg/dL (ref 8–23)
CO2: 25 mmol/L (ref 22–32)
Calcium: 8.8 mg/dL — ABNORMAL LOW (ref 8.9–10.3)
Chloride: 104 mmol/L (ref 98–111)
Creatinine, Ser: 0.83 mg/dL (ref 0.44–1.00)
GFR, Estimated: >60 mL/min (ref >60)
Glucose, Bld: 83 mg/dL (ref 70–99)
Potassium: 3 mmol/L — ABNORMAL LOW (ref 3.5–5.1)
Sodium: 137 mmol/L (ref 135–145)

## 2020-11-27 LAB — CBC
HCT: 41.2 % (ref 36.0–46.0)
Hemoglobin: 13.8 g/dL (ref 12.0–15.0)
MCH: 31.6 pg (ref 26.0–34.0)
MCHC: 33.5 g/dL (ref 30.0–36.0)
MCV: 94.3 fL (ref 80.0–100.0)
Platelets: 152 10*3/uL (ref 150–400)
RBC: 4.37 MIL/uL (ref 3.87–5.11)
RDW: 12.8 % (ref 11.5–15.5)
WBC: 7.6 10*3/uL (ref 4.0–10.5)
nRBC: 0 % (ref 0.0–0.2)

## 2020-11-27 LAB — MAGNESIUM: Magnesium: 1.9 mg/dL (ref 1.7–2.4)

## 2020-11-27 LAB — GLUCOSE, CAPILLARY
Glucose-Capillary: 161 mg/dL — ABNORMAL HIGH (ref 70–99)
Glucose-Capillary: 80 mg/dL (ref 70–99)

## 2020-11-27 MED ORDER — ONDANSETRON 4 MG PO TBDP: 4.0000 mg | ORAL_TABLET | Freq: Three times a day (TID) | ORAL | 0 refills | Status: DC | PRN

## 2020-11-27 MED ORDER — POTASSIUM CHLORIDE CRYS ER 20 MEQ PO TBCR
40.0000 meq | EXTENDED_RELEASE_TABLET | Freq: Once | ORAL | Status: AC
  Administered 2020-11-27: 40 meq via ORAL
  Filled 2020-11-27: qty 2

## 2020-11-27 NOTE — TOC Transition Note (Signed)
Transition of Care [facility]) - CM/SW Discharge Note   Patient Details  Name: PANSY OSTROVSKY MRN: [id number] Date of Birth: 09/02/1955  Transition of Care [facility]) CM/SW Contact:  Leitha Bleak, RN Phone Number: 11/27/2020, 2:05 PM   Clinical Narrative:   Patient discharging home. [facility] called they can not accept her insurance.  TOC reached out to Jacquenette Shone accepted with a pair for HHPT/RN.  Order have been placed, New AVS printed, updated patient.    Final next level of care: Home w Home Health Services Barriers to Discharge: Barriers Resolved  Patient Goals and CMS Choice Patient states their goals for this hospitalization and ongoing recovery are:: to get better. CMS Medicare.gov Compare Post Acute Care list provided to:: Patient   Discharge Placement         Patient and family notified of of transfer: 11/27/20  Discharge Plan and Services     HH Arranged: RN, PT [facility] Agency: [facility] Date [facility] Agency Contacted: 11/27/20 Time HH Agency Contacted: [phone] Representative spoke with at [facility] Agency: Denyse Amass

## 2020-11-27 NOTE — Discharge Summary (Signed)
Physician Discharge Summary  [name] ION:[id number] DOB: Aug 09, 1955 DOA: 11/25/2020  PCP: Bennie Pierini, FNP  Admit date: 11/25/2020  Discharge date: 11/27/2020  Admitted From:Home  Disposition:  Home  Recommendations for Outpatient Follow-up:  Follow up with PCP in 1-2 weeks Recheck BMP in 1 week to assess hypokalemia Continue to monitor blood pressures carefully at home with noted constant fluctuations that are common for her.  Continue on prior home medication regimen Zofran provided as needed for any further nausea or vomiting  Home Health: Yes with PT  Equipment/Devices: Space boot to right lower extremity  Discharge Condition:Stable  CODE STATUS: Full  Diet recommendation: Heart Healthy  Brief/Interim Summary:  Per HPI: [name] is a 65 y.o. female with a history of CHF, diabetes, hypertension, coronary artery disease, hyperlipidemia.  Patient had a hiatal hernia repair done at the beginning of the month.  Today, she presents with decreased appetite and decreased oral food and liquid intake over the past couple of days.  She had 2 syncopal episodes yesterday and 2 more today. On one of her syncopal episodes, she fell and injured her right leg and reports some calf tenderness.  She denies chest pain, shortness of breath.  No palliating or provoking factors.   -Patient has been admitted for evaluation of syncopal episode and is noted to have some ongoing nausea with decreased appetite.  Her blood pressures have been quite elevated with some anxiety noted as well.  She is noted to have better blood pressure control, but usually has labile readings.  She follows up with cardiology for this.  She is now able to tolerate diet with no further nausea or vomiting noted.  PT has evaluated patient with recommendations for home health PT on discharge.  She will also require a cam boot to assist with a mild sprain that she has to her right lower extremity.  2D  echocardiogram performed with LVEF 60-65% and no other acute findings otherwise noted.  She is in stable condition for discharge.  Discharge Diagnoses:  Active Problems:   Essential hypertension   GERD   Postlaminectomy syndrome, lumbar region   GAD (generalized anxiety disorder)   Pre-diabetes   Chronic diastolic CHF (congestive heart failure) (HCC)   Syncope   Hypokalemia  Principal discharge diagnosis: Recurrent syncopal episodes in the setting of labile blood pressure control with poor oral intake.  Hypertensive crisis.  Discharge Instructions  Discharge Instructions     Diet - low sodium heart healthy   Complete by: As directed    Increase activity slowly   Complete by: As directed    Space Boots   Complete by: As directed    For R foot      Allergies as of 11/27/2020       Reactions   Codeine Other (See Comments)   "I will have a heart attack."   Morphine And Related Other (See Comments)   "It will cause me to have a heart attack."   Ambien [zolpidem Tartrate] Nausea And Vomiting   Clonidine Derivatives Nausea And Vomiting   gerd - caused acid reflux per pt   Metformin And Related    Nausea/vomiting/cramping from metformin   Lyrica [pregabalin] Swelling, Other (See Comments)   Weight gain   Neurontin [gabapentin] Other (See Comments)   Causes elevated LFTs        Medication List     STOP taking these medications    butalbital-acetaminophen-caffeine 50-325-40 MG tablet Commonly known as: FIORICET  mupirocin ointment 2 % Commonly known as: BACTROBAN   tiZANidine 4 MG tablet Commonly known as: ZANAFLEX       TAKE these medications    acyclovir ointment 5 % Commonly known as: ZOVIRAX APPLY TOPICALLY EVERY 3 HOURS. What changed: See the new instructions.   albuterol 108 (90 Base) MCG/ACT inhaler Commonly known as: VENTOLIN HFA INHALE 2 PUFFS EVERY 6 HOURS AS NEEDED FOR SHORTNESS OF BREATH AND WHEEZING. What changed: See the new  instructions.   Breo Ellipta 200-25 MCG/INH Aepb Generic drug: fluticasone furoate-vilanterol Inhale 1 puff into the lungs daily. What changed:  when to take this reasons to take this   busPIRone 10 MG tablet Commonly known as: BUSPAR TAKE 1 TABLET BY MOUTH TWICE DAILY AS NEEDED. What changed: reasons to take this   busPIRone 10 MG tablet Commonly known as: BUSPAR Take by mouth. What changed: Another medication with the same name was changed. Make sure you understand how and when to take each.   carvedilol 6.25 MG tablet Commonly known as: COREG TAKE (1) TABLET TWICE DAILY. What changed: See the new instructions.   celecoxib 200 MG capsule Commonly known as: CELEBREX TAKE 1 CAPSULE BY MOUTH TWICE DAILY BETWEEN MEALS AS NEEDED. What changed: See the new instructions.   dexlansoprazole 60 MG capsule Commonly known as: Dexilant Take 1 capsule (60 mg total) by mouth daily.   diclofenac Sodium 1 % Gel Commonly known as: VOLTAREN APPLY 4 GRAMS TOPICALLY 4 TIMES A DAY. What changed: See the new instructions.   DULoxetine 60 MG capsule Commonly known as: CYMBALTA Take 1 capsule (60 mg total) by mouth at bedtime.   empagliflozin 25 MG Tabs tablet Commonly known as: Jardiance Take 1 tablet (25 mg total) by mouth daily before breakfast.   escitalopram 20 MG tablet Commonly known as: LEXAPRO Take 1 tablet (20 mg total) by mouth daily.   estradiol 0.1 MG/GM vaginal cream Commonly known as: ESTRACE Place 1 Applicatorful vaginally at bedtime.   FIBER PO Take 2 tablets by mouth in the morning, at noon, and at bedtime.   fludrocortisone 0.1 MG tablet Commonly known as: FLORINEF TAKE 1 TABLET EVERY OTHER DAY.   fluticasone 50 MCG/ACT nasal spray Commonly known as: FLONASE USE 1 SPRAY IN EACH NOSTRIL ONCE DAILY. What changed:  when to take this reasons to take this   furosemide 40 MG tablet Commonly known as: LASIX TAKE 1 TABLET ONCE DAILY AS NEEDED FOR FLUID. What  changed: See the new instructions.   Global Alcohol Prep Ease 70 % Pads USE 1 PAD DAILY WHEN CHECKING BLOOD SUGAR. R73.03   hydrALAZINE 25 MG tablet Commonly known as: APRESOLINE TAKE 1 TABLET BY MOUTH 3 TIMES DAILY AS NEEDED (FOR SEVERE HYPERTENSION/SYSTOLIC NUMBER 170 OR GREATER).   HYDROcodone-acetaminophen 7.5-325 MG tablet Commonly known as: Norco Take 1 tablet by mouth in the morning, at noon, and at bedtime.   ipratropium 0.02 % nebulizer solution Commonly known as: ATROVENT USE 1 VIAL ( ) IN NEBULIZER EVERY 6 HOURS AS NEEDED FOR WHEEZING OR SHORTNESS OF BREATH. What changed: See the new instructions.   isosorbide mononitrate 30 MG 24 hr tablet Commonly known as: IMDUR TAKE 1 TABLET ONCE DAILY.   lamoTRIgine 150 MG tablet Commonly known as: LAMICTAL Take 1 tablet (150 mg total) by mouth daily.   levETIRAcetam 500 MG tablet Commonly known as: KEPPRA TAKE (1) TABLET TWICE DAILY. What changed: See the new instructions.   levocetirizine 5 MG tablet Commonly known as: XYZAL TAKE  1 TABLET BY MOUTH EVERY MORNING. What changed:  when to take this reasons to take this   lidocaine 5 % ointment Commonly known as: XYLOCAINE APPLY TO AFFECTED AREA 3 TIMES A DAY AS NEEDED FOR MILD OR MODERATE PAIN. What changed:  how much to take how to take this when to take this reasons to take this additional instructions   methocarbamol 500 MG tablet Commonly known as: ROBAXIN Take 500 mg by mouth every 6 (six) hours as needed for muscle spasms. What changed: Another medication with the same name was removed. Continue taking this medication, and follow the directions you see here.   nitroGLYCERIN 0.4 MG SL tablet Commonly known as: NITROSTAT PLACE ONE (1) TABLET UNDER TONGUE EVERY 5 MINUTES UP TO (3) DOSES AS NEEDED FOR CHEST PAIN.   ondansetron 4 MG disintegrating tablet Commonly known as: Zofran ODT Take 1 tablet (4 mg total) by mouth every 8 (eight) hours as needed for  nausea or vomiting.   ondansetron 4 MG tablet Commonly known as: ZOFRAN Take 1 tablet (4 mg total) by mouth every 8 (eight) hours as needed for nausea or vomiting.   OneTouch Delica Plus Lancet33G Misc USE TO CHECK SUGAR DAILY.   OneTouch Verio test strip Generic drug: glucose blood Test BS daily R73.03   oxybutynin 5 MG 24 hr tablet Commonly known as: DITROPAN-XL Take 1 tablet (5 mg total) by mouth at bedtime. What changed: when to take this   PROBIOTIC PO Take 1 capsule by mouth daily.   prochlorperazine 5 MG tablet Commonly known as: COMPAZINE Take by mouth.   promethazine 12.5 MG tablet Commonly known as: PHENERGAN Take by mouth.   rosuvastatin 10 MG tablet Commonly known as: CRESTOR Take 1 tablet (10 mg total) by mouth daily.   traZODone 150 MG tablet Commonly known as: DESYREL Take 1 tablet (150 mg total) by mouth at bedtime.   valACYclovir 1000 MG tablet Commonly known as: VALTREX TAKE 1 TABLET 3 TIMES A DAY. TO BE TAKEN ONLY WHEN SHE HAS OUT BREAK. What changed: See the new instructions.        Follow-up Information     [facility] - Dme Follow up.   Why: PT/RN will call to set up your first home visit.        Bennie Pierini, FNP. Schedule an appointment as soon as possible for a visit in 1 week(s).   Specialty: Family Medicine Contact information: [address] [place] [address] [phone]                Allergies  Allergen Reactions   Codeine Other (See Comments)    "I will have a heart attack."   Morphine And Related Other (See Comments)    "It will cause me to have a heart attack."   Ambien [Zolpidem Tartrate] Nausea And Vomiting   Clonidine Derivatives Nausea And Vomiting    gerd - caused acid reflux per pt   Metformin And Related     Nausea/vomiting/cramping from metformin   Lyrica [Pregabalin] Swelling and Other (See Comments)    Weight gain   Neurontin [Gabapentin] Other (See Comments)     Causes elevated LFTs     Consultations: None   Procedures/Studies: DG Tibia/Fibula Right  Result Date: 11/25/2020 CLINICAL DATA:  Fall and trauma to the right lower extremity. EXAM: RIGHT TIBIA AND FIBULA - 2 VIEW; RIGHT ANKLE - COMPLETE 3+ VIEW COMPARISON:  None. FINDINGS: There is no evidence of fracture or  other focal bone lesions. Soft tissues are unremarkable. IMPRESSION: Negative. Electronically Signed   By: Elgie Collard M.D.   On: 11/25/2020 20:59   DG Ankle Complete Right  Result Date: 11/25/2020 CLINICAL DATA:  Fall and trauma to the right lower extremity. EXAM: RIGHT TIBIA AND FIBULA - 2 VIEW; RIGHT ANKLE - COMPLETE 3+ VIEW COMPARISON:  None. FINDINGS: There is no evidence of fracture or other focal bone lesions. Soft tissues are unremarkable. IMPRESSION: Negative. Electronically Signed   By: Elgie Collard M.D.   On: 11/25/2020 20:59   CT Head Wo Contrast  Result Date: 11/25/2020 CLINICAL DATA:  Fall down stairs, altered mental status, hypotensive EXAM: CT HEAD WITHOUT CONTRAST CT CERVICAL SPINE WITHOUT CONTRAST TECHNIQUE: Multidetector CT imaging of the head and cervical spine was performed following the standard protocol without intravenous contrast. Multiplanar CT image reconstructions of the cervical spine were also generated. COMPARISON:  None. FINDINGS: CT HEAD FINDINGS Brain: No evidence of acute infarction, hemorrhage, hydrocephalus, extra-axial collection or mass lesion/mass effect. Vascular: No hyperdense vessel or unexpected calcification. Skull: Normal. Negative for fracture or focal lesion. Sinuses/Orbits: The visualized paranasal sinuses are essentially clear. The mastoid air cells are unopacified. Other: None. CT CERVICAL SPINE FINDINGS Alignment: Normal cervical lordosis. Skull base and vertebrae: No acute fracture. No primary bone lesion or focal pathologic process. Soft tissues and spinal canal: No prevertebral fluid or swelling. No visible canal hematoma. Disc  levels: Mild multilevel degenerative changes. Spinal canal is patent. Upper chest: Visualized lung apices are clear. Other: Visualized thyroid is unremarkable. IMPRESSION: No evidence of acute intracranial abnormality. Mild cortical atrophy. No evidence of traumatic injury to the cervical spine. Mild degenerative changes. Electronically Signed   By: Charline Bills M.D.   On: 11/25/2020 20:57   CT Cervical Spine Wo Contrast  Result Date: 11/25/2020 CLINICAL DATA:  Fall down stairs, altered mental status, hypotensive EXAM: CT HEAD WITHOUT CONTRAST CT CERVICAL SPINE WITHOUT CONTRAST TECHNIQUE: Multidetector CT imaging of the head and cervical spine was performed following the standard protocol without intravenous contrast. Multiplanar CT image reconstructions of the cervical spine were also generated. COMPARISON:  None. FINDINGS: CT HEAD FINDINGS Brain: No evidence of acute infarction, hemorrhage, hydrocephalus, extra-axial collection or mass lesion/mass effect. Vascular: No hyperdense vessel or unexpected calcification. Skull: Normal. Negative for fracture or focal lesion. Sinuses/Orbits: The visualized paranasal sinuses are essentially clear. The mastoid air cells are unopacified. Other: None. CT CERVICAL SPINE FINDINGS Alignment: Normal cervical lordosis. Skull base and vertebrae: No acute fracture. No primary bone lesion or focal pathologic process. Soft tissues and spinal canal: No prevertebral fluid or swelling. No visible canal hematoma. Disc levels: Mild multilevel degenerative changes. Spinal canal is patent. Upper chest: Visualized lung apices are clear. Other: Visualized thyroid is unremarkable. IMPRESSION: No evidence of acute intracranial abnormality. Mild cortical atrophy. No evidence of traumatic injury to the cervical spine. Mild degenerative changes. Electronically Signed   By: Charline Bills M.D.   On: 11/25/2020 20:57   DG Abdomen Acute W/Chest  Result Date: 11/12/2020 CLINICAL DATA:   Epigastric pain EXAM: DG ABDOMEN ACUTE WITH 1 VIEW CHEST COMPARISON:  UGI 11/02/2019, 09/11/2020 FINDINGS: Single view chest demonstrates low lung volumes no focal opacity, pleural effusion or pneumothorax. Right shoulder replacement. Electronic device over left lower chest. Supine and upright views of the abdomen demonstrate no free air beneath the diaphragm. Moderate fluid and gas a shin of the stomach. Remainder the gas pattern is unobstructed. Multiple phleboliths in the pelvis. Hardware in the  lumbar spine and left hip. Clips in the right upper quadrant. IMPRESSION: Fluid and air distension of the stomach with otherwise unobstructed gas pattern. No acute cardiopulmonary disease. Electronically Signed   By: Jasmine Pang M.D.   On: 11/12/2020 18:11   DG UGI W SINGLE CM (SOL OR THIN BA)  Result Date: 11/01/2020 CLINICAL DATA:  Postop day 1 hiatal hernia repair EXAM: WATER SOLUBLE UPPER GI SERIES TECHNIQUE: Single-column upper GI series was performed using water soluble contrast. CONTRAST:  39mL OMNIPAQUE IOHEXOL 300 MG/ML  SOLN COMPARISON:  Upper GI 09/11/2020 FLUOROSCOPY TIME:  Fluoroscopy Time:  0 minutes 54 second Radiation Exposure Index (if provided by the fluoroscopic device): Number of Acquired Spot Images: 7 FINDINGS: Preliminary KUB reveals marked gastric distension. Gas in nondilated large and small bowel loops. Patient swallowed water-soluble contrast. The esophageal mucosa is normal. There is mild distension of the esophagus. There is dysmotility in the distal esophagus. Hiatal hernia has been reduced. No residual hiatal hernia. No stricture. Contrast flows readily into the stomach which is markedly distended. No esophageal leak identified. IMPRESSION: Satisfactory hiatal hernia repair.  No residual hiatal hernia. No esophageal leak identified. Electronically Signed   By: Marlan Palau M.D.   On: 11/01/2020 09:16   ECHOCARDIOGRAM COMPLETE  Result Date: 11/26/2020    ECHOCARDIOGRAM REPORT    Patient Name:   [name] Date of Exam: 11/26/2020 Medical Rec #:  [id number]         Height:       62.0 in Accession #:    [id number]        Weight:       125.9 lb Date of Birth:  03-Jun-1955         BSA:          1.570 m Patient Age:    65 years          BP:           226/116 mmHg Patient Gender: F                 HR:           67 bpm. [facility] Procedure: 2D Echo, Cardiac Doppler and Color Doppler Indications:    Syncope R55  History:        Patient has prior history of Echocardiogram examinations, most                 recent 01/18/2019. CHF, Previous Myocardial Infarction; Risk                 Factors:Hypertension, Pre-diabetes and Dyslipidemia.  Sonographer:    Celesta Gentile RCS Referring Phys: [phone] JACOB J STINSON IMPRESSIONS  1. Left ventricular ejection fraction, by estimation, is 60 to 65%. The left ventricle has normal function. The left ventricle has no regional wall motion abnormalities. Left ventricular diastolic parameters are indeterminate.  2. Right ventricular systolic function is normal. The right ventricular size is normal. Tricuspid regurgitation signal is inadequate for assessing PA pressure.  3. The mitral valve is grossly normal. Mild mitral valve regurgitation.  4. The aortic valve is tricuspid. There is mild calcification of the aortic valve. Aortic valve regurgitation is mild. No aortic stenosis is present.  5. The inferior vena cava is normal in size with greater than 50% respiratory variability, suggesting right atrial pressure of 3 mmHg. Comparison(s): Prior images reviewed side by side. Aortic regurgitation is overall mild. FINDINGS  Left Ventricle: Left ventricular ejection fraction,  by estimation, is 60 to 65%. The left ventricle has normal function. The left ventricle has no regional wall motion abnormalities. The left ventricular internal cavity size was normal in size. There is  borderline left ventricular hypertrophy. Left ventricular diastolic parameters are  indeterminate. Right Ventricle: The right ventricular size is normal. No increase in right ventricular wall thickness. Right ventricular systolic function is normal. Tricuspid regurgitation signal is inadequate for assessing PA pressure. Left Atrium: Left atrial size was normal in size. Right Atrium: Right atrial size was normal in size. Pericardium: There is no evidence of pericardial effusion. Mitral Valve: The mitral valve is grossly normal. Mild mitral valve regurgitation. Tricuspid Valve: The tricuspid valve is grossly normal. Tricuspid valve regurgitation is trivial. Aortic Valve: The aortic valve is tricuspid. There is mild calcification of the aortic valve. Aortic valve regurgitation is mild. Aortic regurgitation PHT measures 382 msec. No aortic stenosis is present. Pulmonic Valve: The pulmonic valve was grossly normal. Pulmonic valve regurgitation is trivial. Aorta: The aortic root is normal in size and structure. Venous: The inferior vena cava is normal in size with greater than 50% respiratory variability, suggesting right atrial pressure of 3 mmHg. IAS/Shunts: No atrial level shunt detected by color flow Doppler.  LEFT VENTRICLE PLAX 2D LVIDd:         4.20 cm  Diastology LVIDs:         2.20 cm  LV e' medial:    5.66 cm/s LV PW:         0.80 cm  LV E/e' medial:  12.1 LV IVS:        1.00 cm  LV e' lateral:   9.36 cm/s LVOT diam:     2.00 cm  LV E/e' lateral: 7.3 LV SV:         80 LV SV Index:   51 LVOT Area:     3.14 cm  RIGHT VENTRICLE RV S prime:     18.80 cm/s TAPSE (M-mode): 2.0 cm LEFT ATRIUM             Index       RIGHT ATRIUM          Index LA diam:        3.70 cm 2.36 cm/m  RA Area:     8.85 cm LA Vol (A2C):   37.3 ml 23.75 ml/m RA Volume:   15.70 ml 10.00 ml/m LA Vol (A4C):   40.0 ml 25.47 ml/m LA Biplane Vol: 40.5 ml 25.79 ml/m  AORTIC VALVE LVOT Vmax:   138.00 cm/s LVOT Vmean:  86.800 cm/s LVOT VTI:    0.256 m AI PHT:      382 msec  AORTA Ao Root diam: 3.00 cm MITRAL VALVE MV Area (PHT):  3.72 cm     SHUNTS MV Decel Time: 204 msec     Systemic VTI:  0.26 m MV E velocity: 68.40 cm/s   Systemic Diam: 2.00 cm MV A velocity: 136.00 cm/s MV E/A ratio:  0.50 Nona Dell MD Electronically signed by Nona Dell MD Signature Date/Time: 11/26/2020/4:25:34 PM    Final      Discharge Exam: Vitals:   11/27/20 1000 11/27/20 1100  BP:  (!) 182/79  Pulse: 82 91  Resp: 16 17  Temp:    SpO2: 92% 92%   Vitals:   11/27/20 0800 11/27/20 0900 11/27/20 1000 11/27/20 1100  BP: 99/60 (!) 97/51  (!) 182/79  Pulse: 88 80 82 91  Resp: 17  Temp:  98.2 F (36.8 C)    TempSrc:  Oral    SpO2: 92% 92% 92% 92%  Weight:      Height:        General: Pt is alert, awake, not in acute distress Cardiovascular: RRR, S1/S2 +, no rubs, no gallops Respiratory: CTA bilaterally, no wheezing, no rhonchi Abdominal: Soft, NT, ND, bowel sounds + Extremities: no edema, no cyanosis    The results of significant diagnostics from this hospitalization (including imaging, microbiology, ancillary and laboratory) are listed below for reference.     Microbiology: Recent Results (from the past 240 hour(s))  Resp Panel by RT-PCR (Flu A&B, Covid) Nasopharyngeal Swab     Status: None   Collection Time: 11/26/20  6:10 AM   Specimen: Nasopharyngeal Swab; Nasopharyngeal(NP) swabs in vial transport medium  Result Value Ref Range Status   SARS Coronavirus 2 by RT PCR NEGATIVE NEGATIVE Final    Comment: (NOTE) SARS-CoV-2 target nucleic acids are NOT DETECTED.  The SARS-CoV-2 RNA is generally detectable in upper respiratory specimens during the acute phase of infection. The lowest concentration of SARS-CoV-2 viral copies this assay can detect is 138 copies/mL. A negative result does not preclude SARS-Cov-2 infection and should not be used as the sole basis for treatment or other patient management decisions. A negative result may occur with  improper specimen collection/handling, submission of  specimen other than nasopharyngeal swab, presence of viral mutation(s) within the areas targeted by this assay, and inadequate number of viral copies(<138 copies/mL). A negative result must be combined with clinical observations, patient history, and epidemiological information. The expected result is Negative.  Fact Sheet for Patients:  BloggerCourse.com  Fact Sheet for Healthcare Providers:  SeriousBroker.it  This test is no t yet approved or cleared by the [place] FDA and  has been authorized for detection and/or diagnosis of SARS-CoV-2 by FDA under an Emergency Use Authorization (EUA). This EUA will remain  in effect (meaning this test can be used) for the duration of the COVID-19 declaration under Section 564(b)(1) of the Act, 21 U.S.C.section 360bbb-3(b)(1), unless the authorization is terminated  or revoked sooner.       Influenza A by PCR NEGATIVE NEGATIVE Final   Influenza B by PCR NEGATIVE NEGATIVE Final    Comment: (NOTE) The Xpert Xpress SARS-CoV-2/FLU/RSV plus assay is intended as an aid in the diagnosis of influenza from Nasopharyngeal swab specimens and should not be used as a sole basis for treatment. Nasal washings and aspirates are unacceptable for Xpert Xpress SARS-CoV-2/FLU/RSV testing.  Fact Sheet for Patients: BloggerCourse.com  Fact Sheet for Healthcare Providers: SeriousBroker.it  This test is not yet approved or cleared by the [place] FDA and has been authorized for detection and/or diagnosis of SARS-CoV-2 by FDA under an Emergency Use Authorization (EUA). This EUA will remain in effect (meaning this test can be used) for the duration of the COVID-19 declaration under Section 564(b)(1) of the Act, 21 U.S.C. section 360bbb-3(b)(1), unless the authorization is terminated or revoked.  Performed at [facility], [address]  [place], [place] [address]   MRSA Next Gen by PCR, Nasal     Status: None   Collection Time: 11/26/20 11:39 AM   Specimen: Nasal Mucosa; Nasal Swab  Result Value Ref Range Status   MRSA by PCR Next Gen NOT DETECTED NOT DETECTED Final    Comment: (NOTE) The GeneXpert MRSA Assay (FDA approved for NASAL specimens only), is one component of a comprehensive MRSA colonization surveillance program.  It is not intended to diagnose MRSA infection nor to guide or monitor treatment for MRSA infections. Test performance is not FDA approved in patients less than 20 years old. Performed at [facility], [address], [place], [place] [address]      Labs: BNP (last 3 results) No results for input(s): BNP in the last 8760 hours. Basic Metabolic Panel: Recent Labs  Lab 11/25/20 2023 11/26/20 0452 11/27/20 0439  NA 137 140 137  K 2.6* 3.3* 3.0*  CL 106 109 104  CO2 GLUCOSE 112* 91 83  BUN 26* 24* 20  CREATININE 1.76* 1.07* 0.83  CALCIUM 8.4* 8.2* 8.8*  MG 2.1  --  1.9   Liver Function Tests: Recent Labs  Lab 11/25/20 2023  AST 22  ALT 11  ALKPHOS 76  BILITOT 0.5  PROT 6.2*  ALBUMIN 3.8   Recent Labs  Lab 11/25/20 2023  LIPASE 47   No results for input(s): AMMONIA in the last 168 hours. CBC: Recent Labs  Lab 11/25/20 2023 11/26/20 0452 11/27/20 0439  WBC 9.3 8.4 7.6  HGB 14.5 14.0 13.8  HCT 43.5 42.5 41.2  MCV 95.4 96.2 94.3  PLT 156 133* 152   Cardiac Enzymes: No results for input(s): CKTOTAL, CKMB, CKMBINDEX, TROPONINI in the last 168 hours. BNP: Invalid input(s): POCBNP CBG: Recent Labs  Lab 11/26/20 1136 11/26/20 1623 11/26/20 2103 11/27/20 0849 11/27/20 1117  GLUCAP 115* 108* 97 161* 80   D-Dimer No results for input(s): DDIMER in the last 72 hours. Hgb A1c No results for input(s): HGBA1C in the last 72 hours. Lipid Profile No results for input(s): CHOL, HDL, LDLCALC, TRIG, CHOLHDL, LDLDIRECT in the last 72 hours. Thyroid function  studies Recent Labs    11/26/20 0452  TSH 0.597   Anemia work up No results for input(s): VITAMINB12, FOLATE, FERRITIN, TIBC, IRON, RETICCTPCT in the last 72 hours. Urinalysis    Component Value Date/Time   COLORURINE YELLOW 11/08/2019 1146   APPEARANCEUR Clear 09/12/2020 1010   LABSPEC 1.038 (H) 11/08/2019 1146   PHURINE 5.0 11/08/2019 1146   GLUCOSEU 3+ (A) 09/12/2020 1010   HGBUR NEGATIVE 11/08/2019 1146   BILIRUBINUR Negative 09/12/2020 1010   KETONESUR NEGATIVE 11/08/2019 1146   PROTEINUR Negative 09/12/2020 1010   PROTEINUR NEGATIVE 11/08/2019 1146   UROBILINOGEN negative 07/27/2013 1133   UROBILINOGEN 0.2 06/12/2008 1402   NITRITE Negative 09/12/2020 1010   NITRITE NEGATIVE 11/08/2019 1146   LEUKOCYTESUR Trace (A) 09/12/2020 1010   LEUKOCYTESUR TRACE (A) 11/08/2019 1146   Sepsis Labs Invalid input(s): PROCALCITONIN,  WBC,  LACTICIDVEN Microbiology Recent Results (from the past 240 hour(s))  Resp Panel by RT-PCR (Flu A&B, Covid) Nasopharyngeal Swab     Status: None   Collection Time: 11/26/20  6:10 AM   Specimen: Nasopharyngeal Swab; Nasopharyngeal(NP) swabs in vial transport medium  Result Value Ref Range Status   SARS Coronavirus 2 by RT PCR NEGATIVE NEGATIVE Final    Comment: (NOTE) SARS-CoV-2 target nucleic acids are NOT DETECTED.  The SARS-CoV-2 RNA is generally detectable in upper respiratory specimens during the acute phase of infection. The lowest concentration of SARS-CoV-2 viral copies this assay can detect is 138 copies/mL. A negative result does not preclude SARS-Cov-2 infection and should not be used as the sole basis for treatment or other patient management decisions. A negative result may occur with  improper specimen collection/handling, submission of specimen other than nasopharyngeal swab, presence of viral mutation(s) within the areas targeted by this  assay, and inadequate number of viral copies(<138 copies/mL). A negative result must be  combined with clinical observations, patient history, and epidemiological information. The expected result is Negative.  Fact Sheet for Patients:  BloggerCourse.com  Fact Sheet for Healthcare Providers:  SeriousBroker.it  This test is no t yet approved or cleared by the [place] FDA and  has been authorized for detection and/or diagnosis of SARS-CoV-2 by FDA under an Emergency Use Authorization (EUA). This EUA will remain  in effect (meaning this test can be used) for the duration of the COVID-19 declaration under Section 564(b)(1) of the Act, 21 U.S.C.section 360bbb-3(b)(1), unless the authorization is terminated  or revoked sooner.       Influenza A by PCR NEGATIVE NEGATIVE Final   Influenza B by PCR NEGATIVE NEGATIVE Final    Comment: (NOTE) The Xpert Xpress SARS-CoV-2/FLU/RSV plus assay is intended as an aid in the diagnosis of influenza from Nasopharyngeal swab specimens and should not be used as a sole basis for treatment. Nasal washings and aspirates are unacceptable for Xpert Xpress SARS-CoV-2/FLU/RSV testing.  Fact Sheet for Patients: BloggerCourse.com  Fact Sheet for Healthcare Providers: SeriousBroker.it  This test is not yet approved or cleared by the [place] FDA and has been authorized for detection and/or diagnosis of SARS-CoV-2 by FDA under an Emergency Use Authorization (EUA). This EUA will remain in effect (meaning this test can be used) for the duration of the COVID-19 declaration under Section 564(b)(1) of the Act, 21 U.S.C. section 360bbb-3(b)(1), unless the authorization is terminated or revoked.  Performed at [facility], [address], [place], [place] [address]   MRSA Next Gen by PCR, Nasal     Status: None   Collection Time: 11/26/20 11:39 AM   Specimen: Nasal Mucosa; Nasal Swab  Result Value Ref Range Status   MRSA by PCR Next Gen NOT  DETECTED NOT DETECTED Final    Comment: (NOTE) The GeneXpert MRSA Assay (FDA approved for NASAL specimens only), is one component of a comprehensive MRSA colonization surveillance program. It is not intended to diagnose MRSA infection nor to guide or monitor treatment for MRSA infections. Test performance is not FDA approved in patients less than 24 years old. Performed at [facility], [address], [place], [place] [address]      Time coordinating discharge: 35 minutes  SIGNED:   Erick Blinks, DO [facility] 11/27/2020, 12:02 PM  If 7PM-7AM, please contact night-coverage [email or web address]

## 2020-11-27 NOTE — Care Management Obs Status (Signed)
MEDICARE OBSERVATION STATUS NOTIFICATION   Patient Details  Name: [name] MRN: [id number] Date of Birth: September 03, 1955   Medicare Observation Status Notification Given:  Yes    Corey Harold 11/27/2020, 9:58 AM

## 2020-11-27 NOTE — Progress Notes (Signed)
Nsg Discharge Note  Admit Date:  11/25/2020 Discharge date: 11/27/2020   [name] to be D/C'd Home per MD order.  AVS completed.  Copy for chart, and copy for patient signed, and dated. Patient/caregiver able to verbalize understanding.  Discharge Medication: Allergies as of 11/27/2020       Reactions   Codeine Other (See Comments)   "I will have a heart attack."   Morphine And Related Other (See Comments)   "It will cause me to have a heart attack."   Ambien [zolpidem Tartrate] Nausea And Vomiting   Clonidine Derivatives Nausea And Vomiting   gerd - caused acid reflux per pt   Metformin And Related    Nausea/vomiting/cramping from metformin   Lyrica [pregabalin] Swelling, Other (See Comments)   Weight gain   Neurontin [gabapentin] Other (See Comments)   Causes elevated LFTs        Medication List     STOP taking these medications    butalbital-acetaminophen-caffeine 50-325-40 MG tablet Commonly known as: FIORICET   mupirocin ointment 2 % Commonly known as: BACTROBAN   tiZANidine 4 MG tablet Commonly known as: ZANAFLEX       TAKE these medications    acyclovir ointment 5 % Commonly known as: ZOVIRAX APPLY TOPICALLY EVERY 3 HOURS. What changed: See the new instructions.   albuterol 108 (90 Base) MCG/ACT inhaler Commonly known as: VENTOLIN HFA INHALE 2 PUFFS EVERY 6 HOURS AS NEEDED FOR SHORTNESS OF BREATH AND WHEEZING. What changed: See the new instructions.   Breo Ellipta 200-25 MCG/INH Aepb Generic drug: fluticasone furoate-vilanterol Inhale 1 puff into the lungs daily. What changed:  when to take this reasons to take this   busPIRone 10 MG tablet Commonly known as: BUSPAR TAKE 1 TABLET BY MOUTH TWICE DAILY AS NEEDED. What changed: reasons to take this   busPIRone 10 MG tablet Commonly known as: BUSPAR Take by mouth. What changed: Another medication with the same name was changed. Make sure you understand how and when to take each.    carvedilol 6.25 MG tablet Commonly known as: COREG TAKE (1) TABLET TWICE DAILY. What changed: See the new instructions.   celecoxib 200 MG capsule Commonly known as: CELEBREX TAKE 1 CAPSULE BY MOUTH TWICE DAILY BETWEEN MEALS AS NEEDED. What changed: See the new instructions.   dexlansoprazole 60 MG capsule Commonly known as: Dexilant Take 1 capsule (60 mg total) by mouth daily.   diclofenac Sodium 1 % Gel Commonly known as: VOLTAREN APPLY 4 GRAMS TOPICALLY 4 TIMES A DAY. What changed: See the new instructions.   DULoxetine 60 MG capsule Commonly known as: CYMBALTA Take 1 capsule (60 mg total) by mouth at bedtime.   empagliflozin 25 MG Tabs tablet Commonly known as: Jardiance Take 1 tablet (25 mg total) by mouth daily before breakfast.   escitalopram 20 MG tablet Commonly known as: LEXAPRO Take 1 tablet (20 mg total) by mouth daily.   estradiol 0.1 MG/GM vaginal cream Commonly known as: ESTRACE Place 1 Applicatorful vaginally at bedtime.   FIBER PO Take 2 tablets by mouth in the morning, at noon, and at bedtime.   fludrocortisone 0.1 MG tablet Commonly known as: FLORINEF TAKE 1 TABLET EVERY OTHER DAY.   fluticasone 50 MCG/ACT nasal spray Commonly known as: FLONASE USE 1 SPRAY IN EACH NOSTRIL ONCE DAILY. What changed:  when to take this reasons to take this   furosemide 40 MG tablet Commonly known as: LASIX TAKE 1 TABLET ONCE DAILY AS NEEDED FOR FLUID.  What changed: See the new instructions.   Global Alcohol Prep Ease 70 % Pads USE 1 PAD DAILY WHEN CHECKING BLOOD SUGAR. R73.03   hydrALAZINE 25 MG tablet Commonly known as: APRESOLINE TAKE 1 TABLET BY MOUTH 3 TIMES DAILY AS NEEDED (FOR SEVERE HYPERTENSION/SYSTOLIC NUMBER 170 OR GREATER).   HYDROcodone-acetaminophen 7.5-325 MG tablet Commonly known as: Norco Take 1 tablet by mouth in the morning, at noon, and at bedtime.   ipratropium 0.02 % nebulizer solution Commonly known as: ATROVENT USE 1 VIAL  ( ) IN NEBULIZER EVERY 6 HOURS AS NEEDED FOR WHEEZING OR SHORTNESS OF BREATH. What changed: See the new instructions.   isosorbide mononitrate 30 MG 24 hr tablet Commonly known as: IMDUR TAKE 1 TABLET ONCE DAILY.   lamoTRIgine 150 MG tablet Commonly known as: LAMICTAL Take 1 tablet (150 mg total) by mouth daily.   levETIRAcetam 500 MG tablet Commonly known as: KEPPRA TAKE (1) TABLET TWICE DAILY. What changed: See the new instructions.   levocetirizine 5 MG tablet Commonly known as: XYZAL TAKE 1 TABLET BY MOUTH EVERY MORNING. What changed:  when to take this reasons to take this   lidocaine 5 % ointment Commonly known as: XYLOCAINE APPLY TO AFFECTED AREA 3 TIMES A DAY AS NEEDED FOR MILD OR MODERATE PAIN. What changed:  how much to take how to take this when to take this reasons to take this additional instructions   methocarbamol 500 MG tablet Commonly known as: ROBAXIN Take 500 mg by mouth every 6 (six) hours as needed for muscle spasms. What changed: Another medication with the same name was removed. Continue taking this medication, and follow the directions you see here.   nitroGLYCERIN 0.4 MG SL tablet Commonly known as: NITROSTAT PLACE ONE (1) TABLET UNDER TONGUE EVERY 5 MINUTES UP TO (3) DOSES AS NEEDED FOR CHEST PAIN.   ondansetron 4 MG disintegrating tablet Commonly known as: Zofran ODT Take 1 tablet (4 mg total) by mouth every 8 (eight) hours as needed for nausea or vomiting.   ondansetron 4 MG tablet Commonly known as: ZOFRAN Take 1 tablet (4 mg total) by mouth every 8 (eight) hours as needed for nausea or vomiting.   OneTouch Delica Plus Lancet33G Misc USE TO CHECK SUGAR DAILY.   OneTouch Verio test strip Generic drug: glucose blood Test BS daily R73.03   oxybutynin 5 MG 24 hr tablet Commonly known as: DITROPAN-XL Take 1 tablet (5 mg total) by mouth at bedtime. What changed: when to take this   PROBIOTIC PO Take 1 capsule by mouth daily.    prochlorperazine 5 MG tablet Commonly known as: COMPAZINE Take by mouth.   promethazine 12.5 MG tablet Commonly known as: PHENERGAN Take by mouth.   rosuvastatin 10 MG tablet Commonly known as: CRESTOR Take 1 tablet (10 mg total) by mouth daily.   traZODone 150 MG tablet Commonly known as: DESYREL Take 1 tablet (150 mg total) by mouth at bedtime.   valACYclovir 1000 MG tablet Commonly known as: VALTREX TAKE 1 TABLET 3 TIMES A DAY. TO BE TAKEN ONLY WHEN SHE HAS OUT BREAK. What changed: See the new instructions.        Discharge Assessment: Vitals:   11/27/20 1100 11/27/20 1200  BP: (!) 182/79 (!) 165/83  Pulse: 91 97  Resp: 17 17  Temp:    SpO2: 92% 93%   Skin clean, dry and intact without evidence of skin break down, no evidence of skin tears noted. IV catheter discontinued intact. Site without signs and  symptoms of complications - no redness or edema noted at insertion site, patient denies c/o pain - only slight tenderness at site.  Dressing with slight pressure applied.  D/c Instructions-Education: Discharge instructions given to patient/family with verbalized understanding. D/c education completed with patient/family including follow up instructions, medication list, d/c activities limitations if indicated, with other d/c instructions as indicated by MD - patient able to verbalize understanding, all questions fully answered. Patient instructed to return to ED, call 911, or call MD for any changes in condition.  Patient escorted via WC, and D/C home via private auto.  Diego Cory, RN 11/27/2020 1:10 PM

## 2020-11-27 NOTE — Plan of Care (Signed)

## 2020-11-28 ENCOUNTER — Other Ambulatory Visit: Payer: Self-pay | Admitting: Cardiology

## 2020-11-28 ENCOUNTER — Other Ambulatory Visit: Payer: Self-pay | Admitting: General Surgery

## 2020-11-29 ENCOUNTER — Telehealth: Payer: Self-pay | Admitting: Nurse Practitioner

## 2020-11-29 NOTE — Telephone Encounter (Signed)
If patient is having his issues then she likely needs to be seen, if she cannot be seen here in the office then she probably needs to go to emergency department.

## 2020-11-29 NOTE — Telephone Encounter (Signed)
Appt made for 12/04/20 with MMM. Informed that this cannot be for a med check and she understood. She has an appt in Oct for that.

## 2020-11-29 NOTE — Telephone Encounter (Signed)
Ben called in to let us know that pt's resting heart rate is 120 beats per minute, which he stated was 2 times too fast. Her bp was 140/110 after taking her hydralazine but without taking her coreg. Her bp was checked 3 times over the span of 40 minutes and all were around the same but this reading was the best as the top number had come down. According to [facility] she is not having any symptoms of a stroke at this time and was instructed to go to the ER is she starts to. At this time they plan to schedule physical therapy for 1 week of 1, 2 week of 1 and 1 week of 2 because of her strength.   She also had two level 2 medication interactions -  lexapro and zofran  Estrace and lamotrigine

## 2020-12-04 ENCOUNTER — Ambulatory Visit: Payer: 59 | Admitting: Nurse Practitioner

## 2020-12-05 ENCOUNTER — Encounter: Payer: Self-pay | Admitting: Nurse Practitioner

## 2020-12-07 ENCOUNTER — Other Ambulatory Visit: Payer: Self-pay | Admitting: General Surgery

## 2020-12-10 ENCOUNTER — Ambulatory Visit: Payer: 59 | Admitting: Orthopaedic Surgery

## 2020-12-13 ENCOUNTER — Ambulatory Visit (INDEPENDENT_AMBULATORY_CARE_PROVIDER_SITE_OTHER): Payer: 59

## 2020-12-13 ENCOUNTER — Other Ambulatory Visit: Payer: Self-pay

## 2020-12-13 DIAGNOSIS — I088 Other rheumatic multiple valve diseases: Secondary | ICD-10-CM

## 2020-12-13 DIAGNOSIS — I700 Atherosclerosis of aorta: Secondary | ICD-10-CM

## 2020-12-13 DIAGNOSIS — I110 Hypertensive heart disease with heart failure: Secondary | ICD-10-CM | POA: Diagnosis not present

## 2020-12-13 DIAGNOSIS — M810 Age-related osteoporosis without current pathological fracture: Secondary | ICD-10-CM

## 2020-12-13 DIAGNOSIS — I2510 Atherosclerotic heart disease of native coronary artery without angina pectoris: Secondary | ICD-10-CM

## 2020-12-13 DIAGNOSIS — I471 Supraventricular tachycardia: Secondary | ICD-10-CM

## 2020-12-13 DIAGNOSIS — F411 Generalized anxiety disorder: Secondary | ICD-10-CM

## 2020-12-13 DIAGNOSIS — M159 Polyosteoarthritis, unspecified: Secondary | ICD-10-CM

## 2020-12-13 DIAGNOSIS — I5032 Chronic diastolic (congestive) heart failure: Secondary | ICD-10-CM | POA: Diagnosis not present

## 2020-12-13 DIAGNOSIS — E119 Type 2 diabetes mellitus without complications: Secondary | ICD-10-CM | POA: Diagnosis not present

## 2020-12-13 DIAGNOSIS — M4310 Spondylolisthesis, site unspecified: Secondary | ICD-10-CM

## 2020-12-13 DIAGNOSIS — E785 Hyperlipidemia, unspecified: Secondary | ICD-10-CM

## 2020-12-13 DIAGNOSIS — M797 Fibromyalgia: Secondary | ICD-10-CM

## 2020-12-13 DIAGNOSIS — M069 Rheumatoid arthritis, unspecified: Secondary | ICD-10-CM

## 2020-12-13 DIAGNOSIS — M48061 Spinal stenosis, lumbar region without neurogenic claudication: Secondary | ICD-10-CM

## 2020-12-13 DIAGNOSIS — G40909 Epilepsy, unspecified, not intractable, without status epilepticus: Secondary | ICD-10-CM

## 2020-12-13 DIAGNOSIS — F319 Bipolar disorder, unspecified: Secondary | ICD-10-CM

## 2020-12-13 DIAGNOSIS — M47812 Spondylosis without myelopathy or radiculopathy, cervical region: Secondary | ICD-10-CM

## 2020-12-13 DIAGNOSIS — K219 Gastro-esophageal reflux disease without esophagitis: Secondary | ICD-10-CM

## 2020-12-13 DIAGNOSIS — G4700 Insomnia, unspecified: Secondary | ICD-10-CM

## 2020-12-13 DIAGNOSIS — I252 Old myocardial infarction: Secondary | ICD-10-CM

## 2020-12-13 DIAGNOSIS — J449 Chronic obstructive pulmonary disease, unspecified: Secondary | ICD-10-CM

## 2020-12-13 DIAGNOSIS — E876 Hypokalemia: Secondary | ICD-10-CM

## 2020-12-13 DIAGNOSIS — M961 Postlaminectomy syndrome, not elsewhere classified: Secondary | ICD-10-CM

## 2020-12-13 DIAGNOSIS — D649 Anemia, unspecified: Secondary | ICD-10-CM

## 2020-12-14 ENCOUNTER — Other Ambulatory Visit (HOSPITAL_COMMUNITY)
Admission: RE | Admit: 2020-12-14 | Discharge: 2020-12-14 | Disposition: A | Discharge status: Home / Self Care | Payer: 59 | Source: Ambulatory Visit | Attending: Family Medicine | Admitting: Family Medicine

## 2020-12-14 ENCOUNTER — Other Ambulatory Visit: Payer: Self-pay

## 2020-12-14 ENCOUNTER — Encounter: Payer: Self-pay | Admitting: Family Medicine

## 2020-12-14 ENCOUNTER — Ambulatory Visit (INDEPENDENT_AMBULATORY_CARE_PROVIDER_SITE_OTHER): Payer: 59 | Admitting: Family Medicine

## 2020-12-14 VITALS — BP 121/77 | HR 85 | Temp 98.4°F | Ht 62.0 in | Wt 121.2 lb

## 2020-12-14 DIAGNOSIS — N898 Other specified noninflammatory disorders of vagina: Secondary | ICD-10-CM | POA: Diagnosis not present

## 2020-12-14 DIAGNOSIS — N3001 Acute cystitis with hematuria: Secondary | ICD-10-CM | POA: Diagnosis not present

## 2020-12-14 LAB — MICROSCOPIC EXAMINATION
Renal Epithel, UA: NONE SEEN /hpf
WBC, UA: >30 /hpf — AB (ref 0–5)

## 2020-12-14 LAB — URINALYSIS, ROUTINE W REFLEX MICROSCOPIC
Bilirubin, UA: NEGATIVE
Nitrite, UA: NEGATIVE
Specific Gravity, UA: 1.025 (ref 1.005–1.030)
Urobilinogen, Ur: 0.2 mg/dL (ref 0.2–1.0)
pH, UA: 5.5 (ref 5.0–7.5)

## 2020-12-14 LAB — WET PREP FOR TRICH, YEAST, CLUE
Clue Cell Exam: NEGATIVE
Trichomonas Exam: NEGATIVE
Yeast Exam: NEGATIVE

## 2020-12-14 MED ORDER — CEPHALEXIN 500 MG PO CAPS: 500.0000 mg | ORAL_CAPSULE | Freq: Two times a day (BID) | ORAL | 0 refills | Status: AC

## 2020-12-14 NOTE — Progress Notes (Signed)
Assessment & Plan:  1. Acute cystitis with hematuria Education provided on UTIs. Encouraged adequate hydration.  - Urine Culture - cephALEXin (KEFLEX) 500 MG capsule; Take 1 capsule (500 mg total) by mouth 2 (two) times daily for 5 days.  Dispense: 10 capsule; Refill: 0  2. Vaginal discharge Discussed UTI is likely not causing her vaginal discharge. Wet prep negative for clue cells, trich, and yeast.  Positive for > 25 WBCs. Testing for STI pending. - WET PREP FOR TRICH, YEAST, CLUE - Urinalysis, Routine w reflex microscopic - Urine dipstick shows positive for RBC's, positive for protein, positive for glucose, positive for leukocytes, and positive for ketones.  Micro exam: >30 WBC's per HPF and few bacteria. - Urine cytology ancillary only   Follow up plan: Return if symptoms worsen or fail to improve.  Erica Boston, [name], [name], [name] [facility]  Subjective:   Patient ID: [name], female    DOB: 04-05-1955, 65 y.o.   MRN: [id number]  HPI: [name] is a 65 y.o. female presenting on 12/14/2020 for Vaginal Discharge (X 1 day /)  Patient reports thick, heavy, yellow/gray vaginal discharge x1 day.  Denies itching, burning, fever, or pain with intercourse. She last had intercourse two days ago.   ROS: Negative unless specifically indicated above in HPI.   Relevant past medical history reviewed and updated as indicated.   Allergies and medications reviewed and updated.   Current Outpatient Medications:    acyclovir ointment (ZOVIRAX) 5 %, Apply 1 application topically every 3 (three) hours as needed (outbreak)., Disp: 30 g, Rfl: 1   albuterol (VENTOLIN HFA) 108 (90 Base) MCG/ACT inhaler, INHALE 2 PUFFS EVERY 6 HOURS AS NEEDED FOR SHORTNESS OF BREATH AND WHEEZING. (Patient taking differently: Inhale 2 puffs into the lungs every 6 (six) hours as needed for wheezing or shortness of breath.), Disp: 8.5 g, Rfl: 2   Alcohol Swabs (GLOBAL ALCOHOL  PREP EASE) 70 % PADS, USE 1 PAD DAILY WHEN CHECKING BLOOD SUGAR. R73.03, Disp: 100 each, Rfl: 3   busPIRone (BUSPAR) 10 MG tablet, TAKE 1 TABLET BY MOUTH TWICE DAILY AS NEEDED. (Patient taking differently: Take 10 mg by mouth 2 (two) times daily as needed (agitation/anxiety).), Disp: 60 tablet, Rfl: 0   busPIRone (BUSPAR) 10 MG tablet, Take by mouth., Disp: , Rfl:    busPIRone (BUSPAR) 10 MG tablet, TAKE 1 TABLET BY MOUTH TWICE DAILY AS NEEDED., Disp: 60 tablet, Rfl: 1   carvedilol (COREG) 6.25 MG tablet, TAKE (1) TABLET TWICE DAILY. (Patient taking differently: Take 1.5 mg by mouth 2 (two) times daily with a meal. BP over 150 before taken), Disp: 60 tablet, Rfl: 0   celecoxib (CELEBREX) 200 MG capsule, TAKE 1 CAPSULE BY MOUTH TWICE DAILY BETWEEN MEALS AS NEEDED. (Patient taking differently: Take 200 mg by mouth 2 (two) times daily as needed for mild pain.), Disp: 60 capsule, Rfl: 0   dexlansoprazole (DEXILANT) 60 MG capsule, Take 1 capsule (60 mg total) by mouth daily., Disp: 90 capsule, Rfl: 1   diclofenac Sodium (VOLTAREN) 1 % GEL, APPLY 4 GRAMS TOPICALLY 4 TIMES A DAY., Disp: 100 g, Rfl: 0   DULoxetine (CYMBALTA) 60 MG capsule, Take 1 capsule (60 mg total) by mouth at bedtime., Disp: 90 capsule, Rfl: 1   empagliflozin (JARDIANCE) 25 MG TABS tablet, Take 1 tablet (25 mg total) by mouth daily before breakfast., Disp: 90 tablet, Rfl: 3   escitalopram (LEXAPRO) 20 MG tablet, Take 1 tablet (20 mg  total) by mouth daily., Disp: 90 tablet, Rfl: 1   estradiol (ESTRACE) 0.1 MG/GM vaginal cream, Place 1 Applicatorful vaginally at bedtime., Disp: 42.5 g, Rfl: 0   FIBER PO, Take 2 tablets by mouth in the morning, at noon, and at bedtime., Disp: , Rfl:    fludrocortisone (FLORINEF) 0.1 MG tablet, TAKE 1 TABLET EVERY OTHER DAY. (Patient taking differently: Take 0.1 mg by mouth every other day.), Disp: 45 tablet, Rfl: 3   fluticasone (FLONASE) 50 MCG/ACT nasal spray, USE 1 SPRAY IN EACH NOSTRIL ONCE DAILY.  (Patient taking differently: Place 1 spray into both nostrils daily as needed for allergies.), Disp: 16 g, Rfl: 5   fluticasone furoate-vilanterol (BREO ELLIPTA) 200-25 MCG/INH AEPB, Inhale 1 puff into the lungs daily. (Patient taking differently: Inhale 1 puff into the lungs daily as needed (wheezing).), Disp: 60 each, Rfl: 5   furosemide (LASIX) 40 MG tablet, TAKE 1 TABLET ONCE DAILY AS NEEDED FOR FLUID. (Patient taking differently: Take 40 mg by mouth daily as needed for fluid.), Disp: 90 tablet, Rfl: 0   glucose blood (ONETOUCH VERIO) test strip, Test BS daily R73.03, Disp: 100 strip, Rfl: 3   hydrALAZINE (APRESOLINE) 25 MG tablet, TAKE 1 TABLET BY MOUTH 3 TIMES DAILY AS NEEDED (FOR SEVERE HYPERTENSION/SYSTOLIC NUMBER 170 OR GREATER)., Disp: 90 tablet, Rfl: 3   HYDROcodone-acetaminophen (NORCO) 7.5-325 MG tablet, Take 1 tablet by mouth in the morning, at noon, and at bedtime., Disp: 90 tablet, Rfl: 0   ipratropium (ATROVENT) 0.02 % nebulizer solution, USE 1 VIAL ( ) IN NEBULIZER EVERY 6 HOURS AS NEEDED FOR WHEEZING OR SHORTNESS OF BREATH. (Patient taking differently: Take 3 mLs by nebulization every 6 (six) hours as needed for wheezing or shortness of breath.), Disp: 150 mL, Rfl: 2   isosorbide mononitrate (IMDUR) 30 MG 24 hr tablet, TAKE 1 TABLET ONCE DAILY. (Patient taking differently: Take 30 mg by mouth daily.), Disp: 30 tablet, Rfl: 0   lamoTRIgine (LAMICTAL) 150 MG tablet, Take 1 tablet (150 mg total) by mouth daily., Disp: 90 tablet, Rfl: 1   Lancets (ONETOUCH DELICA PLUS LANCET33G) MISC, USE TO CHECK SUGAR DAILY., Disp: 300 each, Rfl: 5   levETIRAcetam (KEPPRA) 500 MG tablet, TAKE (1) TABLET TWICE DAILY. (Patient taking differently: Take 500 mg by mouth 2 (two) times daily.), Disp: 60 tablet, Rfl: 0   levocetirizine (XYZAL) 5 MG tablet, TAKE 1 TABLET BY MOUTH EVERY MORNING. (Patient taking differently: Take 5 mg by mouth daily as needed for allergies.), Disp: 30 tablet, Rfl: 3   lidocaine  (XYLOCAINE) 5 % ointment, APPLY TO AFFECTED AREA 3 TIMES A DAY AS NEEDED FOR MILD OR MODERATE PAIN., Disp: 35.44 g, Rfl: 0   methocarbamol (ROBAXIN) 500 MG tablet, Take 500 mg by mouth every 6 (six) hours as needed for muscle spasms., Disp: , Rfl:    nitroGLYCERIN (NITROSTAT) 0.4 MG SL tablet, PLACE ONE (1) TABLET UNDER TONGUE EVERY 5 MINUTES UP TO (3) DOSES AS NEEDED FOR CHEST PAIN., Disp: 25 tablet, Rfl: 2   ondansetron (ZOFRAN ODT) 4 MG disintegrating tablet, Take 1 tablet (4 mg total) by mouth every 8 (eight) hours as needed for nausea or vomiting., Disp: 20 tablet, Rfl: 0   ondansetron (ZOFRAN) 4 MG tablet, Take 1 tablet (4 mg total) by mouth every 8 (eight) hours as needed for nausea or vomiting., Disp: 20 tablet, Rfl: 0   oxybutynin (DITROPAN-XL) 5 MG 24 hr tablet, Take 1 tablet (5 mg total) by mouth at bedtime. (Patient taking differently:  Take 5 mg by mouth every other day.), Disp: 90 tablet, Rfl: 1   Probiotic Product (PROBIOTIC PO), Take 1 capsule by mouth daily., Disp: , Rfl:    prochlorperazine (COMPAZINE) 5 MG tablet, Take by mouth., Disp: , Rfl:    promethazine (PHENERGAN) 12.5 MG tablet, Take by mouth., Disp: , Rfl:    rosuvastatin (CRESTOR) 10 MG tablet, Take 1 tablet (10 mg total) by mouth daily., Disp: 90 tablet, Rfl: 1   traZODone (DESYREL) 150 MG tablet, Take 1 tablet (150 mg total) by mouth at bedtime., Disp: 90 tablet, Rfl: 1   valACYclovir (VALTREX) 1000 MG tablet, TAKE 1 TABLET 3 TIMES A DAY. TO BE TAKEN ONLY WHEN SHE HAS OUT BREAK., Disp: 21 tablet, Rfl: 2  Allergies  Allergen Reactions   Codeine Other (See Comments)    "I will have a heart attack."   Morphine And Related Other (See Comments)    "It will cause me to have a heart attack."   Ambien [Zolpidem Tartrate] Nausea And Vomiting   Clonidine Derivatives Nausea And Vomiting    gerd - caused acid reflux per pt   Metformin And Related     Nausea/vomiting/cramping from metformin   Lyrica [Pregabalin] Swelling and  Other (See Comments)    Weight gain   Neurontin [Gabapentin] Other (See Comments)    Causes elevated LFTs     Objective:   BP 121/77   Pulse 85   Temp 98.4 F (36.9 C) (Temporal)   Ht 5\' 2"  (1.575 m)   Wt 121 lb 3.2 oz (55 kg)   BMI 22.17 kg/m    Physical Exam Vitals reviewed.  Constitutional:      General: She is not in acute distress.    Appearance: Normal appearance. She is not ill-appearing, toxic-appearing or diaphoretic.  HENT:     Head: Normocephalic and atraumatic.  Eyes:     General: No scleral icterus.       Right eye: No discharge.        Left eye: No discharge.     Conjunctiva/sclera: Conjunctivae normal.  Cardiovascular:     Rate and Rhythm: Normal rate.  Pulmonary:     Effort: Pulmonary effort is normal. No respiratory distress.  Musculoskeletal:        General: Normal range of motion.     Cervical back: Normal range of motion.  Skin:    General: Skin is warm and dry.     Capillary Refill: Capillary refill takes less than 2 seconds.  Neurological:     General: No focal deficit present.     Mental Status: She is alert and oriented to person, place, and time. Mental status is at baseline.  Psychiatric:        Mood and Affect: Mood normal.        Behavior: Behavior normal.        Thought Content: Thought content normal.        Judgment: Judgment normal.

## 2020-12-17 ENCOUNTER — Encounter: Payer: Self-pay | Admitting: Family Medicine

## 2020-12-17 DIAGNOSIS — A549 Gonococcal infection, unspecified: Secondary | ICD-10-CM

## 2020-12-17 LAB — URINE CYTOLOGY ANCILLARY ONLY
Chlamydia: NEGATIVE
Comment: NEGATIVE
Comment: NEGATIVE
Comment: NORMAL
Neisseria Gonorrhea: POSITIVE — AB
Trichomonas: NEGATIVE

## 2020-12-18 ENCOUNTER — Other Ambulatory Visit: Payer: Self-pay

## 2020-12-18 ENCOUNTER — Ambulatory Visit (INDEPENDENT_AMBULATORY_CARE_PROVIDER_SITE_OTHER): Payer: 59

## 2020-12-18 DIAGNOSIS — N898 Other specified noninflammatory disorders of vagina: Secondary | ICD-10-CM

## 2020-12-18 LAB — URINE CULTURE

## 2020-12-18 MED ORDER — CEFTRIAXONE SODIUM 500 MG IJ SOLR
500.0000 mg | Freq: Once | INTRAMUSCULAR | Status: AC
  Administered 2020-12-18: 500 mg via INTRAMUSCULAR

## 2020-12-21 ENCOUNTER — Telehealth: Payer: Self-pay | Admitting: Nurse Practitioner

## 2020-12-21 MED ORDER — FLUCONAZOLE 150 MG PO TABS
150.0000 mg | ORAL_TABLET | Freq: Once | ORAL | 0 refills | Status: AC
Start: 2020-12-21 — End: 2020-12-21

## 2020-12-21 NOTE — Telephone Encounter (Signed)
Can something be sent in? Treating physician is off  Will send to PCP to advise

## 2020-12-21 NOTE — Telephone Encounter (Signed)
Sent in diflucan

## 2021-01-01 ENCOUNTER — Ambulatory Visit (INDEPENDENT_AMBULATORY_CARE_PROVIDER_SITE_OTHER): Payer: 59 | Admitting: Nurse Practitioner

## 2021-01-01 ENCOUNTER — Other Ambulatory Visit: Payer: Self-pay

## 2021-01-01 ENCOUNTER — Other Ambulatory Visit: Payer: Self-pay | Admitting: Nurse Practitioner

## 2021-01-01 ENCOUNTER — Encounter: Payer: Self-pay | Admitting: Nurse Practitioner

## 2021-01-01 VITALS — BP 136/90 | HR 71 | Temp 98.3°F | Resp 20 | Ht 62.0 in | Wt 124.0 lb

## 2021-01-01 DIAGNOSIS — E876 Hypokalemia: Secondary | ICD-10-CM

## 2021-01-01 DIAGNOSIS — F411 Generalized anxiety disorder: Secondary | ICD-10-CM

## 2021-01-01 DIAGNOSIS — R55 Syncope and collapse: Secondary | ICD-10-CM

## 2021-01-01 DIAGNOSIS — F3341 Major depressive disorder, recurrent, in partial remission: Secondary | ICD-10-CM

## 2021-01-01 DIAGNOSIS — M810 Age-related osteoporosis without current pathological fracture: Secondary | ICD-10-CM

## 2021-01-01 DIAGNOSIS — I10 Essential (primary) hypertension: Secondary | ICD-10-CM

## 2021-01-01 DIAGNOSIS — E785 Hyperlipidemia, unspecified: Secondary | ICD-10-CM

## 2021-01-01 DIAGNOSIS — I200 Unstable angina: Secondary | ICD-10-CM

## 2021-01-01 DIAGNOSIS — K5790 Diverticulosis of intestine, part unspecified, without perforation or abscess without bleeding: Secondary | ICD-10-CM

## 2021-01-01 DIAGNOSIS — J309 Allergic rhinitis, unspecified: Secondary | ICD-10-CM

## 2021-01-01 DIAGNOSIS — I5032 Chronic diastolic (congestive) heart failure: Secondary | ICD-10-CM | POA: Diagnosis not present

## 2021-01-01 DIAGNOSIS — B009 Herpesviral infection, unspecified: Secondary | ICD-10-CM

## 2021-01-01 DIAGNOSIS — M5412 Radiculopathy, cervical region: Secondary | ICD-10-CM

## 2021-01-01 DIAGNOSIS — R7303 Prediabetes: Secondary | ICD-10-CM

## 2021-01-01 DIAGNOSIS — N941 Unspecified dyspareunia: Secondary | ICD-10-CM

## 2021-01-01 DIAGNOSIS — K2100 Gastro-esophageal reflux disease with esophagitis, without bleeding: Secondary | ICD-10-CM

## 2021-01-01 DIAGNOSIS — F5101 Primary insomnia: Secondary | ICD-10-CM

## 2021-01-01 DIAGNOSIS — Z6822 Body mass index (BMI) 22.0-22.9, adult: Secondary | ICD-10-CM

## 2021-01-01 DIAGNOSIS — Z23 Encounter for immunization: Secondary | ICD-10-CM

## 2021-01-01 DIAGNOSIS — G4733 Obstructive sleep apnea (adult) (pediatric): Secondary | ICD-10-CM

## 2021-01-01 DIAGNOSIS — R569 Unspecified convulsions: Secondary | ICD-10-CM

## 2021-01-01 DIAGNOSIS — R0789 Other chest pain: Secondary | ICD-10-CM

## 2021-01-01 LAB — BAYER DCA HB A1C WAIVED: HB A1C (BAYER DCA - WAIVED): 5.2 % (ref 4.8–5.6)

## 2021-01-01 MED ORDER — ESCITALOPRAM OXALATE 20 MG PO TABS: 20.0000 mg | ORAL_TABLET | Freq: Every day | ORAL | 1 refills | Status: DC

## 2021-01-01 MED ORDER — HYDROCODONE-ACETAMINOPHEN 7.5-325 MG PO TABS: 1.0000 | ORAL_TABLET | Freq: Three times a day (TID) | ORAL | 0 refills | Status: AC

## 2021-01-01 MED ORDER — ESTRADIOL 0.1 MG/GM VA CREA: 1.0000 | TOPICAL_CREAM | Freq: Every day | VAGINAL | 6 refills | Status: DC

## 2021-01-01 MED ORDER — ROSUVASTATIN CALCIUM 10 MG PO TABS: 10.0000 mg | ORAL_TABLET | Freq: Every day | ORAL | 1 refills | Status: DC

## 2021-01-01 MED ORDER — FUROSEMIDE 40 MG PO TABS: 40.0000 mg | ORAL_TABLET | Freq: Every day | ORAL | 1 refills | Status: DC | PRN

## 2021-01-01 MED ORDER — TRAZODONE HCL 150 MG PO TABS: 150.0000 mg | ORAL_TABLET | Freq: Every day | ORAL | 1 refills | Status: DC

## 2021-01-01 MED ORDER — BUTALBITAL-APAP-CAFFEINE 50-325-40 MG PO TABS: 1.0000 | ORAL_TABLET | Freq: Four times a day (QID) | ORAL | 0 refills | Status: DC | PRN

## 2021-01-01 MED ORDER — FLUTICASONE FUROATE-VILANTEROL 200-25 MCG/INH IN AEPB: 1.0000 | INHALATION_SPRAY | Freq: Every day | RESPIRATORY_TRACT | 5 refills | Status: DC

## 2021-01-01 MED ORDER — EMPAGLIFLOZIN 25 MG PO TABS: 25.0000 mg | ORAL_TABLET | Freq: Every day | ORAL | 3 refills | Status: DC

## 2021-01-01 MED ORDER — ISOSORBIDE MONONITRATE ER 30 MG PO TB24: 30.0000 mg | ORAL_TABLET | Freq: Every day | ORAL | 1 refills | Status: DC

## 2021-01-01 MED ORDER — DULOXETINE HCL 60 MG PO CPEP: 60.0000 mg | ORAL_CAPSULE | Freq: Every day | ORAL | 1 refills | Status: DC

## 2021-01-01 MED ORDER — LAMOTRIGINE 150 MG PO TABS: 150.0000 mg | ORAL_TABLET | Freq: Every day | ORAL | 1 refills | Status: DC

## 2021-01-01 MED ORDER — DEXLANSOPRAZOLE 60 MG PO CPDR: 1.0000 | DELAYED_RELEASE_CAPSULE | Freq: Every day | ORAL | 1 refills | Status: DC

## 2021-01-01 MED ORDER — BUSPIRONE HCL 10 MG PO TABS: 10.0000 mg | ORAL_TABLET | Freq: Two times a day (BID) | ORAL | 5 refills | Status: DC | PRN

## 2021-01-01 MED ORDER — CARVEDILOL 3.125 MG PO TABS: 1.5000 mg | ORAL_TABLET | Freq: Two times a day (BID) | ORAL | 1 refills | Status: DC

## 2021-01-01 MED ORDER — LEVETIRACETAM 500 MG PO TABS: 500.0000 mg | ORAL_TABLET | Freq: Two times a day (BID) | ORAL | 1 refills | Status: DC

## 2021-01-01 NOTE — Progress Notes (Signed)
Subjective:    Patient ID: [name], female    DOB: 1955/08/01, 65 y.o.   MRN: [id number]   Chief Complaint: medical management of chronic issues     HPI:  1. Essential hypertension Has occasional chest pain that cardiology is aware of. No SOB or headaches as of late. BP Readings from Last 3 Encounters:  12/14/20 121/77  11/27/20 (!) 165/83  11/12/20 118/74     2. Chronic diastolic CHF (congestive heart failure) (HCC) Last saw cardiology on 09/11/20 for surgical clearance. According to office note she was cleared for surgery and no changes were made to plan of care.  3. Unstable angina (HCC) Has occasional and cardilogy is aware. Takes imdur as prescribed  4. Obstructive sleep apnea syndrome Does not wear CPAP. She still does not have machine.  5. Diverticulosis No recent flare ups  6. Gastroesophageal reflux disease with esophagitis without hemorrhage Has bene doing better. Had a nissen fundoplication in august. Appetite still not up to where it was prior to surgery  7. Cervical radiculitis Had back surgery 2  years ago. Is better. Walking better. Deneis any major back pain.they want to operate on her scoliosis but they have post poned that for now.  8. Age-related osteoporosis without current pathological fracture Last dexascan was done 08/25/20. Her t score was -1.3. she is doing some weight bearing exercises.  9. Hyperlipidemia with target LDL less than 100 Does try to watch diet and has had poor appetite since her nissen surgery., tries to do light exercises daily. Lab Results  Component Value Date   CHOL 183 09/12/2020   HDL 57 09/12/2020   LDLCALC 107 (H) 09/12/2020   LDLDIRECT 142.1 04/22/2007   TRIG 103 09/12/2020   CHOLHDL 3.2 09/12/2020    10. Hypokalemia No lower ext cramping. Does not appear to beon potassium supplement. Lab Results  Component Value Date   K 3.0 (L) 11/27/2020     11. Primary insomnia Is on trazadone to sleep. Some  nights she sleeps good and other nights she does not.  12. Recurrent major depressive disorder, in partial remission (HCC) Takes combination of cymbalta and lexapro. Says she is doing good , despite so many medical problems this year. Depression screen [facility] 2/9 01/01/2021 12/14/2020 10/10/2020  Decreased Interest 0 0 0  Down, Depressed, Hopeless 0 0 0  PHQ - 2 Score 0 0 0  Altered sleeping 1 1 -  Tired, decreased energy 0 1 -  Change in appetite 0 0 -  Feeling bad or failure about yourself  0 0 -  Trouble concentrating 1 1 -  Moving slowly or fidgety/restless 0 0 -  Suicidal thoughts 0 0 -  PHQ-9 Score 2 3 -  Difficult doing work/chores Not difficult at all Not difficult at all -  Some recent data might be hidden     13. Seizures (HCC) Is on keppra. Has not seen neurology lately. Her last seizure was   14. SYNCOPE She has been put on florinef because they thought episodes were an autonomic dysfunction and she had 4 syncopial episodes end of September.   15. Overweight (BMI 25.0-29.9) No recent weight changes Wt Readings from Last 3 Encounters:  01/01/21 124 lb (56.2 kg)  12/14/20 121 lb 3.2 oz (55 kg)  11/26/20 125 lb 14.1 oz (57.1 kg)   BMI Readings from Last 3 Encounters:  01/01/21 22.68 kg/m  12/14/20 22.17 kg/m  11/26/20 23.02 kg/m       Outpatient  Encounter Medications as of 01/01/2021  Medication Sig   acyclovir ointment (ZOVIRAX) 5 % Apply 1 application topically every 3 (three) hours as needed (outbreak).   albuterol (VENTOLIN HFA) 108 (90 Base) MCG/ACT inhaler INHALE 2 PUFFS EVERY 6 HOURS AS NEEDED FOR SHORTNESS OF BREATH AND WHEEZING. (Patient taking differently: Inhale 2 puffs into the lungs every 6 (six) hours as needed for wheezing or shortness of breath.)   Alcohol Swabs (GLOBAL ALCOHOL PREP EASE) 70 % PADS USE 1 PAD DAILY WHEN CHECKING BLOOD SUGAR. R73.03   busPIRone (BUSPAR) 10 MG tablet TAKE 1 TABLET BY MOUTH TWICE DAILY AS NEEDED. (Patient taking  differently: Take 10 mg by mouth 2 (two) times daily as needed (agitation/anxiety).)   busPIRone (BUSPAR) 10 MG tablet Take by mouth.   busPIRone (BUSPAR) 10 MG tablet TAKE 1 TABLET BY MOUTH TWICE DAILY AS NEEDED.   carvedilol (COREG) 6.25 MG tablet TAKE (1) TABLET TWICE DAILY. (Patient taking differently: Take 1.5 mg by mouth 2 (two) times daily with a meal. BP over 150 before taken)   celecoxib (CELEBREX) 200 MG capsule TAKE 1 CAPSULE BY MOUTH TWICE DAILY BETWEEN MEALS AS NEEDED. (Patient taking differently: Take 200 mg by mouth 2 (two) times daily as needed for mild pain.)   dexlansoprazole (DEXILANT) 60 MG capsule Take 1 capsule (60 mg total) by mouth daily.   diclofenac Sodium (VOLTAREN) 1 % GEL APPLY 4 GRAMS TOPICALLY 4 TIMES A DAY.   DULoxetine (CYMBALTA) 60 MG capsule Take 1 capsule (60 mg total) by mouth at bedtime.   empagliflozin (JARDIANCE) 25 MG TABS tablet Take 1 tablet (25 mg total) by mouth daily before breakfast.   escitalopram (LEXAPRO) 20 MG tablet Take 1 tablet (20 mg total) by mouth daily.   estradiol (ESTRACE) 0.1 MG/GM vaginal cream Place 1 Applicatorful vaginally at bedtime.   FIBER PO Take 2 tablets by mouth in the morning, at noon, and at bedtime.   fludrocortisone (FLORINEF) 0.1 MG tablet TAKE 1 TABLET EVERY OTHER DAY. (Patient taking differently: Take 0.1 mg by mouth every other day.)   fluticasone (FLONASE) 50 MCG/ACT nasal spray USE 1 SPRAY IN EACH NOSTRIL ONCE DAILY. (Patient taking differently: Place 1 spray into both nostrils daily as needed for allergies.)   fluticasone furoate-vilanterol (BREO ELLIPTA) 200-25 MCG/INH AEPB Inhale 1 puff into the lungs daily. (Patient taking differently: Inhale 1 puff into the lungs daily as needed (wheezing).)   furosemide (LASIX) 40 MG tablet TAKE 1 TABLET ONCE DAILY AS NEEDED FOR FLUID. (Patient taking differently: Take 40 mg by mouth daily as needed for fluid.)   glucose blood (ONETOUCH VERIO) test strip Test BS daily R73.03    hydrALAZINE (APRESOLINE) 25 MG tablet TAKE 1 TABLET BY MOUTH 3 TIMES DAILY AS NEEDED (FOR SEVERE HYPERTENSION/SYSTOLIC NUMBER 170 OR GREATER).   ipratropium (ATROVENT) 0.02 % nebulizer solution USE 1 VIAL ( ) IN NEBULIZER EVERY 6 HOURS AS NEEDED FOR WHEEZING OR SHORTNESS OF BREATH. (Patient taking differently: Take 3 mLs by nebulization every 6 (six) hours as needed for wheezing or shortness of breath.)   isosorbide mononitrate (IMDUR) 30 MG 24 hr tablet TAKE 1 TABLET ONCE DAILY. (Patient taking differently: Take 30 mg by mouth daily.)   lamoTRIgine (LAMICTAL) 150 MG tablet Take 1 tablet (150 mg total) by mouth daily.   Lancets (ONETOUCH DELICA PLUS LANCET33G) MISC USE TO CHECK SUGAR DAILY.   levETIRAcetam (KEPPRA) 500 MG tablet TAKE (1) TABLET TWICE DAILY. (Patient taking differently: Take 500 mg by mouth 2 (two)  times daily.)   levocetirizine (XYZAL) 5 MG tablet TAKE 1 TABLET BY MOUTH EVERY MORNING. (Patient taking differently: Take 5 mg by mouth daily as needed for allergies.)   lidocaine (XYLOCAINE) 5 % ointment APPLY TO AFFECTED AREA 3 TIMES A DAY AS NEEDED FOR MILD OR MODERATE PAIN.   methocarbamol (ROBAXIN) 500 MG tablet Take 500 mg by mouth every 6 (six) hours as needed for muscle spasms.   nitroGLYCERIN (NITROSTAT) 0.4 MG SL tablet PLACE ONE (1) TABLET UNDER TONGUE EVERY 5 MINUTES UP TO (3) DOSES AS NEEDED FOR CHEST PAIN.   ondansetron (ZOFRAN ODT) 4 MG disintegrating tablet Take 1 tablet (4 mg total) by mouth every 8 (eight) hours as needed for nausea or vomiting.   ondansetron (ZOFRAN) 4 MG tablet Take 1 tablet (4 mg total) by mouth every 8 (eight) hours as needed for nausea or vomiting.   oxybutynin (DITROPAN-XL) 5 MG 24 hr tablet Take 1 tablet (5 mg total) by mouth at bedtime. (Patient taking differently: Take 5 mg by mouth every other day.)   Probiotic Product (PROBIOTIC PO) Take 1 capsule by mouth daily.   prochlorperazine (COMPAZINE) 5 MG tablet Take by mouth.   promethazine  (PHENERGAN) 12.5 MG tablet Take by mouth.   rosuvastatin (CRESTOR) 10 MG tablet Take 1 tablet (10 mg total) by mouth daily.   traZODone (DESYREL) 150 MG tablet Take 1 tablet (150 mg total) by mouth at bedtime.   valACYclovir (VALTREX) 1000 MG tablet TAKE 1 TABLET 3 TIMES A DAY. TO BE TAKEN ONLY WHEN SHE HAS OUT BREAK.   No facility-administered encounter medications on file as of 01/01/2021.    Past Surgical History:  Procedure Laterality Date   BACK SURGERY     BREAST SURGERY     lumpectomy   CARDIAC CATHETERIZATION  10/06/2011   CATARACT EXTRACTION W/PHACO Right 07/31/2017   Procedure: CATARACT EXTRACTION PHACO AND INTRAOCULAR LENS PLACEMENT (IOC);  Surgeon: Fabio Pierce, MD;  Location: [facility];  Service: Ophthalmology;  Laterality: Right;  CDE: 2.33   CATARACT EXTRACTION W/PHACO Left 08/14/2017   Procedure: CATARACT EXTRACTION PHACO AND INTRAOCULAR LENS PLACEMENT (IOC);  Surgeon: Fabio Pierce, MD;  Location: [facility];  Service: Ophthalmology;  Laterality: Left;  CDE: 2.74   CHOLECYSTECTOMY     CYSTOSCOPY     stone   DIAGNOSTIC LAPAROSCOPY     laparoscopic cholecystectomy   DOPPLER ECHOCARDIOGRAPHY  2009   ESOPHAGOGASTRODUODENOSCOPY N/A 10/31/2020   Procedure: ESOPHAGOGASTRODUODENOSCOPY (EGD);  Surgeon: Gaynelle Adu, MD;  Location: [facility];  Service: General;  Laterality: N/A;   head up tilt table testing  06/15/2007   [name]   HEMORRHOID SURGERY     insertion of implatable loop recorder  08/11/2007   [name]   POSTERIOR CERVICAL FUSION/FORAMINOTOMY N/A 12/19/2013   Procedure: RIGHT C3-4.C4-5 AND C5-6 FORAMINOTOMIES;  Surgeon: Kerrin Champagne, MD;  Location: [facility];  Service: Orthopedics;  Laterality: N/A;   TOTAL HIP ARTHROPLASTY Left 01/31/2020   Procedure: LEFT TOTAL HIP ARTHROPLASTY ANTERIOR APPROACH;  Surgeon: Kathryne Hitch, MD;  Location: [facility];  Service: Orthopedics;  Laterality: Left;   TOTAL SHOULDER ARTHROPLASTY Right 11/15/2019   Procedure: RIGHT TOTAL SHOULDER  ARTHROPLASTY;  Surgeon: Cammy Copa, MD;  Location: [facility];  Service: Orthopedics;  Laterality: Right;   TUBAL LIGATION     UPPER GASTROINTESTINAL ENDOSCOPY     XI ROBOTIC ASSISTED HIATAL HERNIA REPAIR N/A 10/31/2020   Procedure: XI ROBOTIC ASSISTED HIATAL HERNIA REPAIR WITH PARTIAL FUNDOPLICATION;  Surgeon:  Gaynelle Adu, MD;  Location: [facility];  Service: General;  Laterality: N/A;    Family History  Problem Relation Age of Onset   Heart attack Father    Mental illness Father    Mental illness Mother    Heart attack Brother        stents   Alcohol abuse Brother    Heart disease Brother    Drug abuse Brother    Diabetes Brother    Colon cancer Maternal Aunt    Cirrhosis Brother    Stomach cancer Neg Hx    Esophageal cancer Neg Hx    Rectal cancer Neg Hx     New complaints: None today. Still weak form abdominal surgery  Social history: Lives by herself and her daughter check son her daily.  Controlled substance contract: n/a     Review of Systems  Constitutional:  Negative for diaphoresis.  Eyes:  Negative for pain.  Respiratory:  Negative for shortness of breath.   Cardiovascular:  Negative for chest pain, palpitations and leg swelling.  Gastrointestinal:  Negative for abdominal pain.  Endocrine: Negative for polydipsia.  Skin:  Negative for rash.  Neurological:  Negative for dizziness, weakness and headaches.  Hematological:  Does not bruise/bleed easily.  All other systems reviewed and are negative.     Objective:   Physical Exam Vitals and nursing note reviewed.  Constitutional:      General: She is not in acute distress.    Appearance: Normal appearance. She is well-developed.  HENT:     Head: Normocephalic.     Right Ear: Tympanic membrane normal.     Left Ear: Tympanic membrane normal.     Nose: Nose normal.     Mouth/Throat:     Mouth: Mucous membranes are moist.  Eyes:     Pupils: Pupils are equal, round, and reactive to light.  Neck:      Vascular: No carotid bruit or JVD.  Cardiovascular:     Rate and Rhythm: Normal rate and regular rhythm.     Heart sounds: Normal heart sounds.     Comments: Pac's audible  Pulmonary:     Effort: Pulmonary effort is normal. No respiratory distress.     Breath sounds: Normal breath sounds. No wheezing or rales.  Chest:     Chest wall: No tenderness.  Abdominal:     General: Bowel sounds are normal. There is no distension or abdominal bruit.     Palpations: Abdomen is soft. There is no hepatomegaly, splenomegaly, mass or pulsatile mass.     Tenderness: There is no abdominal tenderness.  Musculoskeletal:        General: Normal range of motion.     Cervical back: Normal range of motion and neck supple.     Right lower leg: No edema.     Left lower leg: No edema.  Lymphadenopathy:     Cervical: No cervical adenopathy.  Skin:    General: Skin is warm and dry.  Neurological:     Mental Status: She is alert and oriented to person, place, and time.     Deep Tendon Reflexes: Reflexes are normal and symmetric.  Psychiatric:        Behavior: Behavior normal.        Thought Content: Thought content normal.        Judgment: Judgment normal.   BP 136/90   Pulse 71   Temp 98.3 F (36.8 C) (Temporal)   Resp 20   Ht 5'  2" (1.575 m)   Wt 124 lb (56.2 kg)   SpO2 97%   BMI 22.68 kg/m         Assessment & Plan:  [name] comes in today with chief complaint of Medical Management of Chronic Issues   Diagnosis and orders addressed:  1. Essential hypertension Low soiium diet - furosemide (LASIX) 40 MG tablet; Take 1 tablet (40 mg total) by mouth daily as needed for fluid.  Dispense: 90 tablet; Refill: 1  2. Chronic diastolic CHF (congestive heart failure) (HCC) Keep follow up with cardiology - carvedilol (COREG) 3.125 MG tablet; Take 0.5 tablets (1.5625 mg total) by mouth 2 (two) times daily with a meal. BP over 150 before taken  Dispense: 180 tablet; Refill: 1  3.  Unstable angina (HCC)  4. Obstructive sleep apnea syndrome Need to get cpap machine  5. Diverticulosis Watch diet to prevent flare up  6. Gastroesophageal reflux disease with esophagitis without hemorrhage Avoid spicy foods Do not eat 2 hours prior to bedtime  - dexlansoprazole (DEXILANT) 60 MG capsule; Take 1 capsule (60 mg total) by mouth daily.  Dispense: 90 capsule; Refill: 1  7. Cervical radiculitis Keep follow up with neurology  8. Age-related osteoporosis without current pathological fracture Weight bearinmg exercises when can tolerate  9. Hyperlipidemia with target LDL less than 100 Low fat diet - rosuvastatin (CRESTOR) 10 MG tablet; Take 1 tablet (10 mg total) by mouth daily.  Dispense: 90 tablet; Refill: 1  10. Hypokalemia Labs pending  11. Primary insomnia Bedtime routine - traZODone (DESYREL) 150 MG tablet; Take 1 tablet (150 mg total) by mouth at bedtime.  Dispense: 90 tablet; Refill: 1  12. Recurrent major depressive disorder, in partial remission (HCC) Stress management - DULoxetine (CYMBALTA) 60 MG capsule; Take 1 capsule (60 mg total) by mouth at bedtime.  Dispense: 90 capsule; Refill: 1 - escitalopram (LEXAPRO) 20 MG tablet; Take 1 tablet (20 mg total) by mouth daily.  Dispense: 90 tablet; Refill: 1 - busPIRone (BUSPAR) 10 MG tablet; Take 1 tablet (10 mg total) by mouth 2 (two) times daily as needed.  Dispense: 60 tablet; Refill: 5  13. Seizures (HCC) - levETIRAcetam (KEPPRA) 500 MG tablet; Take 1 tablet (500 mg total) by mouth 2 (two) times daily.  Dispense: 90 tablet; Refill: 1  14. SYNCOPE Report any new episodes  15. BMI 22.0-22.9, adult Discussed diet and exercise for person with BMI >25 Will recheck weight in 3-6 months    16. Allergic rhinitis, unspecified seasonality, unspecified trigger - fluticasone furoate-vilanterol (BREO ELLIPTA) 200-25 MCG/INH AEPB; Inhale 1 puff into the lungs daily.  Dispense: 60 each; Refill: 5  17. Dyspareunia in  female - estradiol (ESTRACE) 0.1 MG/GM vaginal cream; Place 1 Applicatorful vaginally at bedtime.  Dispense: 42.5 g; Refill: 6  18. GAD (generalized anxiety disorder) - lamoTRIgine (LAMICTAL) 150 MG tablet; Take 1 tablet (150 mg total) by mouth daily.  Dispense: 90 tablet; Refill: 1  19. Other chest pain - isosorbide mononitrate (IMDUR) 30 MG 24 hr tablet; Take 1 tablet (30 mg total) by mouth daily.  Dispense: 90 tablet; Refill: 1  20. Pre-diabetes Watch carbs in diet - empagliflozin (JARDIANCE) 25 MG TABS tablet; Take 1 tablet (25 mg total) by mouth daily before breakfast.  Dispense: 90 tablet; Refill: 3   Labs pending Health Maintenance reviewed Diet and exercise encouraged  Follow up plan: 6 months   [name] Daphine Deutscher, FNP

## 2021-01-01 NOTE — Addendum Note (Signed)
Addended by: Bennie Pierini on: 01/01/2021 03:49 PM   Modules accepted: Orders

## 2021-01-02 DIAGNOSIS — Z23 Encounter for immunization: Secondary | ICD-10-CM | POA: Diagnosis not present

## 2021-01-02 LAB — THYROID PANEL WITH TSH
Free Thyroxine Index: 1.8 (ref 1.2–4.9)
T3 Uptake Ratio: 22 % — ABNORMAL LOW (ref 24–39)
T4, Total: 8.1 ug/dL (ref 4.5–12.0)
TSH: 1.05 u[IU]/mL (ref 0.450–4.500)

## 2021-01-02 LAB — CBC WITH DIFFERENTIAL/PLATELET
Basophils Absolute: 0 10*3/uL (ref 0.0–0.2)
Basos: 1 %
EOS (ABSOLUTE): 0.1 10*3/uL (ref 0.0–0.4)
Eos: 3 %
Hematocrit: 44.1 % (ref 34.0–46.6)
Hemoglobin: 14.8 g/dL (ref 11.1–15.9)
Immature Grans (Abs): 0 10*3/uL (ref 0.0–0.1)
Immature Granulocytes: 0 %
Lymphocytes Absolute: 2.1 10*3/uL (ref 0.7–3.1)
Lymphs: 42 %
MCH: 31.6 pg (ref 26.6–33.0)
MCHC: 33.6 g/dL (ref 31.5–35.7)
MCV: 94 fL (ref 79–97)
Monocytes Absolute: 0.4 10*3/uL (ref 0.1–0.9)
Monocytes: 8 %
Neutrophils Absolute: 2.3 10*3/uL (ref 1.4–7.0)
Neutrophils: 46 %
Platelets: 172 10*3/uL (ref 150–450)
RBC: 4.68 x10E6/uL (ref 3.77–5.28)
RDW: 14.3 % (ref 11.7–15.4)
WBC: 5 10*3/uL (ref 3.4–10.8)

## 2021-01-02 LAB — LIPID PANEL
Chol/HDL Ratio: 2.9 ratio (ref 0.0–4.4)
Cholesterol, Total: 125 mg/dL (ref 100–199)
HDL: 43 mg/dL (ref >39)
LDL Chol Calc (NIH): 66 mg/dL (ref 0–99)
Triglycerides: 81 mg/dL (ref 0–149)
VLDL Cholesterol Cal: 16 mg/dL (ref 5–40)

## 2021-01-02 LAB — CMP14+EGFR
ALT: 55 IU/L — ABNORMAL HIGH (ref 0–32)
AST: 33 IU/L (ref 0–40)
Albumin/Globulin Ratio: 2.1 (ref 1.2–2.2)
Albumin: 4.2 g/dL (ref 3.8–4.8)
Alkaline Phosphatase: 178 IU/L — ABNORMAL HIGH (ref 44–121)
BUN/Creatinine Ratio: 21 (ref 12–28)
BUN: 12 mg/dL (ref 8–27)
Bilirubin Total: 0.3 mg/dL (ref 0.0–1.2)
CO2: 24 mmol/L (ref 20–29)
Calcium: 9.7 mg/dL (ref 8.7–10.3)
Chloride: 105 mmol/L (ref 96–106)
Creatinine, Ser: 0.56 mg/dL — ABNORMAL LOW (ref 0.57–1.00)
Globulin, Total: 2 g/dL (ref 1.5–4.5)
Glucose: 89 mg/dL (ref 70–99)
Potassium: 4 mmol/L (ref 3.5–5.2)
Sodium: 144 mmol/L (ref 134–144)
Total Protein: 6.2 g/dL (ref 6.0–8.5)
eGFR: 102 mL/min/{1.73_m2} (ref >59)

## 2021-01-02 MED ORDER — VALACYCLOVIR HCL 1 G PO TABS: ORAL_TABLET | ORAL | 2 refills | Status: DC

## 2021-01-14 ENCOUNTER — Other Ambulatory Visit: Payer: Self-pay

## 2021-01-14 ENCOUNTER — Other Ambulatory Visit (HOSPITAL_COMMUNITY)
Admission: RE | Admit: 2021-01-14 | Discharge: 2021-01-14 | Disposition: A | Discharge status: Home / Self Care | Payer: 59 | Source: Ambulatory Visit | Attending: Family Medicine | Admitting: Family Medicine

## 2021-01-14 ENCOUNTER — Encounter: Payer: Self-pay | Admitting: Family Medicine

## 2021-01-14 ENCOUNTER — Ambulatory Visit (INDEPENDENT_AMBULATORY_CARE_PROVIDER_SITE_OTHER): Payer: 59 | Admitting: Family Medicine

## 2021-01-14 VITALS — BP 129/81 | HR 63 | Temp 97.9°F | Ht 62.0 in | Wt 122.2 lb

## 2021-01-14 DIAGNOSIS — N898 Other specified noninflammatory disorders of vagina: Secondary | ICD-10-CM

## 2021-01-14 DIAGNOSIS — Z202 Contact with and (suspected) exposure to infections with a predominantly sexual mode of transmission: Secondary | ICD-10-CM | POA: Insufficient documentation

## 2021-01-14 NOTE — Progress Notes (Signed)
Assessment & Plan:  1-2. Vaginal discharge/Exposure to gonorrhea Discussed with patient that she needs to have a serious conversation with her partner about treatment. Advised she not have sex with him again until she knows he has been treated. Will treat when results come back. - WET PREP FOR TRICH, YEAST, CLUE - Urine cytology ancillary only   Follow up plan: Return if symptoms worsen or fail to improve.  Deliah Boston, MSN, APRN, FNP-C [facility]  Subjective:   Patient ID: [name], female    DOB: [birth year], 65 y.o.   MRN: [id number]  HPI: [name] is a 65 y.o. female presenting on 01/14/2021 for Vaginal Discharge (Patient states that after intercourse she has been having yellow discharge which started Sunday. )  Patient reports yellow vaginal discharge that started yesterday. She was recently treated for gonorrhea. She did not verify with her partner prior to having sex again and apparently he never went a got treated.    ROS: Negative unless specifically indicated above in HPI.   Relevant past medical history reviewed and updated as indicated.   Allergies and medications reviewed and updated.   Current Outpatient Medications:    acyclovir ointment (ZOVIRAX) 5 %, APPLY TOPICALLY EVERY 3 HOURS., Disp: 30 g, Rfl: 0   albuterol (VENTOLIN HFA) 108 (90 Base) MCG/ACT inhaler, Inhale 2 puffs into the lungs every 6 (six) hours as needed for wheezing or shortness of breath., Disp: 8 g, Rfl: 3   Alcohol Swabs (GLOBAL ALCOHOL PREP EASE) 70 % PADS, USE 1 PAD DAILY WHEN CHECKING BLOOD SUGAR. R73.03, Disp: 100 each, Rfl: 3   busPIRone (BUSPAR) 10 MG tablet, Take 1 tablet (10 mg total) by mouth 2 (two) times daily as needed., Disp: 60 tablet, Rfl: 5   butalbital-acetaminophen-caffeine (FIORICET) 50-325-40 MG tablet, Take 1 tablet by mouth every 6 (six) hours as needed for headache., Disp: 20 tablet, Rfl: 0   carvedilol (COREG) 3.125 MG tablet, Take  0.5 tablets (1.5625 mg total) by mouth 2 (two) times daily with a meal. BP over 150 before taken, Disp: 180 tablet, Rfl: 1   celecoxib (CELEBREX) 200 MG capsule, TAKE 1 CAPSULE BY MOUTH TWICE DAILY BETWEEN MEALS AS NEEDED. (Patient taking differently: Take 200 mg by mouth 2 (two) times daily as needed for mild pain.), Disp: 60 capsule, Rfl: 0   dexlansoprazole (DEXILANT) 60 MG capsule, Take 1 capsule (60 mg total) by mouth daily., Disp: 90 capsule, Rfl: 1   diclofenac Sodium (VOLTAREN) 1 % GEL, APPLY 4 GRAMS TOPICALLY 4 TIMES A DAY., Disp: 100 g, Rfl: 0   DULoxetine (CYMBALTA) 60 MG capsule, Take 1 capsule (60 mg total) by mouth at bedtime., Disp: 90 capsule, Rfl: 1   empagliflozin (JARDIANCE) 25 MG TABS tablet, Take 1 tablet (25 mg total) by mouth daily before breakfast., Disp: 90 tablet, Rfl: 3   escitalopram (LEXAPRO) 20 MG tablet, Take 1 tablet (20 mg total) by mouth daily., Disp: 90 tablet, Rfl: 1   estradiol (ESTRACE) 0.1 MG/GM vaginal cream, Place 1 Applicatorful vaginally at bedtime., Disp: 42.5 g, Rfl: 6   FIBER PO, Take 2 tablets by mouth in the morning, at noon, and at bedtime., Disp: , Rfl:    fludrocortisone (FLORINEF) 0.1 MG tablet, TAKE 1 TABLET EVERY OTHER DAY. (Patient taking differently: Take 0.1 mg by mouth every other day.), Disp: 45 tablet, Rfl: 3   fluticasone (FLONASE) 50 MCG/ACT nasal spray, USE 1 SPRAY IN EACH NOSTRIL ONCE DAILY. (Patient taking  differently: Place 1 spray into both nostrils daily as needed for allergies.), Disp: 16 g, Rfl: 5   fluticasone furoate-vilanterol (BREO ELLIPTA) 200-25 MCG/INH AEPB, Inhale 1 puff into the lungs daily., Disp: 60 each, Rfl: 5   furosemide (LASIX) 40 MG tablet, Take 1 tablet (40 mg total) by mouth daily as needed for fluid., Disp: 90 tablet, Rfl: 1   glucose blood (ONETOUCH VERIO) test strip, Test BS daily R73.03, Disp: 100 strip, Rfl: 3   hydrALAZINE (APRESOLINE) 25 MG tablet, TAKE 1 TABLET BY MOUTH 3 TIMES DAILY AS NEEDED (FOR SEVERE  HYPERTENSION/SYSTOLIC NUMBER 170 OR GREATER)., Disp: 90 tablet, Rfl: 3   [START ON 03/02/2021] HYDROcodone-acetaminophen (NORCO) 7.5-325 MG tablet, Take 1 tablet by mouth in the morning, at noon, and at bedtime., Disp: 90 tablet, Rfl: 0   [START ON 01/31/2021] HYDROcodone-acetaminophen (NORCO) 7.5-325 MG tablet, Take 1 tablet by mouth in the morning, at noon, and at bedtime., Disp: 90 tablet, Rfl: 0   HYDROcodone-acetaminophen (NORCO) 7.5-325 MG tablet, Take 1 tablet by mouth 3 (three) times daily., Disp: 90 tablet, Rfl: 0   ipratropium (ATROVENT) 0.02 % nebulizer solution, USE 1 VIAL ( ) IN NEBULIZER EVERY 6 HOURS AS NEEDED FOR WHEEZING OR SHORTNESS OF BREATH. (Patient taking differently: Take 3 mLs by nebulization every 6 (six) hours as needed for wheezing or shortness of breath.), Disp: 150 mL, Rfl: 2   isosorbide mononitrate (IMDUR) 30 MG 24 hr tablet, Take 1 tablet (30 mg total) by mouth daily., Disp: 90 tablet, Rfl: 1   lamoTRIgine (LAMICTAL) 150 MG tablet, Take 1 tablet (150 mg total) by mouth daily., Disp: 90 tablet, Rfl: 1   Lancets (ONETOUCH DELICA PLUS LANCET33G) MISC, USE TO CHECK SUGAR DAILY., Disp: 300 each, Rfl: 5   levETIRAcetam (KEPPRA) 500 MG tablet, Take 1 tablet (500 mg total) by mouth 2 (two) times daily., Disp: 90 tablet, Rfl: 1   levocetirizine (XYZAL) 5 MG tablet, TAKE 1 TABLET BY MOUTH EVERY MORNING., Disp: 30 tablet, Rfl: 5   lidocaine (XYLOCAINE) 5 % ointment, APPLY TO AFFECTED AREA 3 TIMES A DAY AS NEEDED FOR MILD OR MODERATE PAIN., Disp: 35.44 g, Rfl: 0   methocarbamol (ROBAXIN) 500 MG tablet, Take 500 mg by mouth every 6 (six) hours as needed for muscle spasms., Disp: , Rfl:    nitroGLYCERIN (NITROSTAT) 0.4 MG SL tablet, PLACE ONE (1) TABLET UNDER TONGUE EVERY 5 MINUTES UP TO (3) DOSES AS NEEDED FOR CHEST PAIN., Disp: 25 tablet, Rfl: 2   ondansetron (ZOFRAN ODT) 4 MG disintegrating tablet, Take 1 tablet (4 mg total) by mouth every 8 (eight) hours as needed for nausea or  vomiting., Disp: 20 tablet, Rfl: 0   oxybutynin (DITROPAN-XL) 5 MG 24 hr tablet, Take 1 tablet (5 mg total) by mouth at bedtime. (Patient taking differently: Take 5 mg by mouth every other day.), Disp: 90 tablet, Rfl: 1   Probiotic Product (PROBIOTIC PO), Take 1 capsule by mouth daily., Disp: , Rfl:    prochlorperazine (COMPAZINE) 5 MG tablet, Take by mouth., Disp: , Rfl:    promethazine (PHENERGAN) 12.5 MG tablet, Take by mouth., Disp: , Rfl:    rosuvastatin (CRESTOR) 10 MG tablet, Take 1 tablet (10 mg total) by mouth daily., Disp: 90 tablet, Rfl: 1   traZODone (DESYREL) 150 MG tablet, Take 1 tablet (150 mg total) by mouth at bedtime., Disp: 90 tablet, Rfl: 1   valACYclovir (VALTREX) 1000 MG tablet, TAKE 1 TABLET 3 TIMES A DAY. TO BE TAKEN ONLY WHEN  SHE HAS OUT BREAK., Disp: 21 tablet, Rfl: 2  Allergies  Allergen Reactions   Codeine Other (See Comments)    "I will have a heart attack."   Morphine And Related Other (See Comments)    "It will cause me to have a heart attack."   Ambien [Zolpidem Tartrate] Nausea And Vomiting   Clonidine Derivatives Nausea And Vomiting    gerd - caused acid reflux per pt   Metformin And Related     Nausea/vomiting/cramping from metformin   Lyrica [Pregabalin] Swelling and Other (See Comments)    Weight gain   Neurontin [Gabapentin] Other (See Comments)    Causes elevated LFTs     Objective:   BP 129/81   Pulse 63   Temp 97.9 F (36.6 C) (Temporal)   Ht 5\' 2"  (1.575 m)   Wt 122 lb 3.2 oz (55.4 kg)   BMI 22.35 kg/m    Physical Exam Vitals reviewed.  Constitutional:      General: She is not in acute distress.    Appearance: Normal appearance. She is not ill-appearing, toxic-appearing or diaphoretic.  HENT:     Head: Normocephalic and atraumatic.  Eyes:     General: No scleral icterus.       Right eye: No discharge.        Left eye: No discharge.     Conjunctiva/sclera: Conjunctivae normal.  Cardiovascular:     Rate and Rhythm: Normal  rate.  Pulmonary:     Effort: Pulmonary effort is normal. No respiratory distress.  Musculoskeletal:        General: Normal range of motion.     Cervical back: Normal range of motion.  Skin:    General: Skin is warm and dry.     Capillary Refill: Capillary refill takes less than 2 seconds.  Neurological:     General: No focal deficit present.     Mental Status: She is alert and oriented to person, place, and time. Mental status is at baseline.  Psychiatric:        Mood and Affect: Mood normal.        Behavior: Behavior normal.        Thought Content: Thought content normal.        Judgment: Judgment normal.

## 2021-01-15 LAB — URINE CYTOLOGY ANCILLARY ONLY
Chlamydia: NEGATIVE
Comment: NEGATIVE
Comment: NEGATIVE
Comment: NORMAL
Neisseria Gonorrhea: POSITIVE — AB
Trichomonas: NEGATIVE

## 2021-01-15 LAB — WET PREP FOR TRICH, YEAST, CLUE
Clue Cell Exam: NEGATIVE
Trichomonas Exam: NEGATIVE
Yeast Exam: NEGATIVE

## 2021-01-16 ENCOUNTER — Other Ambulatory Visit: Payer: Self-pay

## 2021-01-16 ENCOUNTER — Ambulatory Visit (INDEPENDENT_AMBULATORY_CARE_PROVIDER_SITE_OTHER): Payer: 59

## 2021-01-16 DIAGNOSIS — A549 Gonococcal infection, unspecified: Secondary | ICD-10-CM | POA: Diagnosis not present

## 2021-01-16 DIAGNOSIS — Z202 Contact with and (suspected) exposure to infections with a predominantly sexual mode of transmission: Secondary | ICD-10-CM

## 2021-01-16 MED ORDER — CEFTRIAXONE SODIUM 500 MG IJ SOLR
500.0000 mg | Freq: Once | INTRAMUSCULAR | Status: AC
  Administered 2021-01-16: 500 mg via INTRAMUSCULAR

## 2021-01-16 NOTE — Progress Notes (Signed)
Per Deliah Boston, Rocephin 500 mg given to left upper outer quadrant.  Patient tolerated well.

## 2021-01-30 ENCOUNTER — Other Ambulatory Visit: Payer: Self-pay | Admitting: Nurse Practitioner

## 2021-01-30 ENCOUNTER — Other Ambulatory Visit: Payer: Self-pay | Admitting: Cardiology

## 2021-01-30 DIAGNOSIS — B009 Herpesviral infection, unspecified: Secondary | ICD-10-CM

## 2021-02-02 ENCOUNTER — Encounter: Payer: Self-pay | Admitting: Orthopaedic Surgery

## 2021-02-04 ENCOUNTER — Other Ambulatory Visit: Payer: Self-pay

## 2021-02-04 ENCOUNTER — Other Ambulatory Visit: Payer: Self-pay | Admitting: Orthopaedic Surgery

## 2021-02-04 MED ORDER — BLOOD GLUCOSE METER KIT: PACK | 0 refills | Status: AC

## 2021-02-04 MED ORDER — TIZANIDINE HCL 4 MG PO TABS: 4.0000 mg | ORAL_TABLET | Freq: Three times a day (TID) | ORAL | 0 refills | Status: DC | PRN

## 2021-02-28 ENCOUNTER — Other Ambulatory Visit: Payer: Self-pay | Admitting: Cardiology

## 2021-02-28 ENCOUNTER — Other Ambulatory Visit: Payer: Self-pay | Admitting: Nurse Practitioner

## 2021-02-28 DIAGNOSIS — R569 Unspecified convulsions: Secondary | ICD-10-CM

## 2021-02-28 DIAGNOSIS — B009 Herpesviral infection, unspecified: Secondary | ICD-10-CM

## 2021-03-14 ENCOUNTER — Encounter: Payer: Self-pay | Admitting: Cardiology

## 2021-03-14 ENCOUNTER — Ambulatory Visit (INDEPENDENT_AMBULATORY_CARE_PROVIDER_SITE_OTHER): Payer: 59 | Admitting: Cardiology

## 2021-03-14 VITALS — BP 126/76 | HR 64 | Ht 62.0 in | Wt 124.0 lb

## 2021-03-14 DIAGNOSIS — E782 Mixed hyperlipidemia: Secondary | ICD-10-CM | POA: Diagnosis not present

## 2021-03-14 DIAGNOSIS — R55 Syncope and collapse: Secondary | ICD-10-CM | POA: Diagnosis not present

## 2021-03-14 DIAGNOSIS — R0789 Other chest pain: Secondary | ICD-10-CM

## 2021-03-14 DIAGNOSIS — G909 Disorder of the autonomic nervous system, unspecified: Secondary | ICD-10-CM

## 2021-03-14 MED ORDER — ISOSORBIDE MONONITRATE ER 30 MG PO TB24: 45.0000 mg | ORAL_TABLET | Freq: Every day | ORAL | 6 refills | Status: DC

## 2021-03-14 NOTE — Progress Notes (Signed)
Clinical Summary [name] is a 65 y.o.female seen today for follow up of the following medical problems.   1. Recurrent syncope/Autonomic dysfunction/HTN - followed by Dr Lovena Le, according to notes prior loop recorder showed no clear arrhythmias - thought to be neurally mediated syncope secondary to vasodepression according to notes, has been started on florinef daily and midodrine prn   - referred to autonomic specialist at [facility]. Issues getting arranged with transporation.          - we changed florinef to 0.62m every other day due to high bp's - since change has done well, no significant dizziness or syncope  - has done well recently. Takes coreg prn, taking florinef every other day, hydralazine prn, imdur daily.        2. Chest pain - several year history of chest pain - normal cath in 2013, normal stress test 2013 - [facility]. Negative workup for ACS. She was discharged with an out patient Lexiscan which showed no evidence of ischemia.   -  echo 11/2014 LVEF 55-60%, no WMAs.  - 09/2017 nuclear stress no ischemia.  - started on imdur by pcp with improved symptoms.      12/2018 admission with chest pain - no objective evidence of ischemia by EKG or enzymes this admission - echo LVE 622-97% grade I diastolic dysfunction  -  symptoms were noncardiac in description. Lasting constantly x 6 days, worst with deep breathing.   - chronic stable symptoms. 1-2 times week.  - better with prn NG    3. Hyperlipidemia -12/2020 TC 125 TG 81 HDL 43 LDL 66 - she is on crestor Past Medical History:  Diagnosis Date   Allergy    Anemia    Anginal pain ([facility]    last time    Anxiety    Arthritis    RHEUMATOID   Asthma    Bipolar 1 disorder ([facility]    Blood transfusion without reported diagnosis    Cataracts, bilateral 07/2017   CHF (congestive heart failure) (HCC)    COPD (chronic obstructive pulmonary disease) (HCC)    Coronary artery  disease    reported hx of "MI";  Echo 2009 with normal LVF;  Myoview 05/2011: no ischemia   Depression    Diabetes mellitus without complication (HCC)    Dyslipidemia    Dysrhythmia    SVT   Esophageal stricture    Fibromyalgia    GERD (gastroesophageal reflux disease)    H/O hiatal hernia    Head injury, unspecified    Headache    migraines   Herpes simplex infection    History of kidney stones    History of loop recorder    Managed by Dr. GCrissie Sickles  Hyperlipidemia    Hypertension    Insomnia    Myocardial infarction ([facility]    age 65  Osteoporosis    Pneumonia    hx   Seizures [place]    Shortness of breath    Sleep apnea    ? neg   Spinal stenosis of lumbar region    Spondylolisthesis    Status post placement of implantable loop recorder    Supraventricular tachycardia (HCC)    Syncope and collapse    s/p ILR; no arhythmogenic cause identified   UTI (lower urinary tract infection)      Allergies  Allergen Reactions   Codeine Other (See Comments)    "I will have  a heart attack."   Morphine And Related Other (See Comments)    "It will cause me to have a heart attack."   Ambien [Zolpidem Tartrate] Nausea And Vomiting   Clonidine Derivatives Nausea And Vomiting    gerd - caused acid reflux per pt   Metformin And Related     Nausea/vomiting/cramping from metformin   Lyrica [Pregabalin] Swelling and Other (See Comments)    Weight gain   Neurontin [Gabapentin] Other (See Comments)    Causes elevated LFTs      Current Outpatient Medications  Medication Sig Dispense Refill   Accu-Chek Softclix Lancets lancets Test BS up to 4 times a day Dx E11.9 100 each 11   acyclovir ointment (ZOVIRAX) 5 % APPLY TOPICALLY EVERY 3 HOURS. 30 g 0   albuterol (VENTOLIN HFA) 108 (90 Base) MCG/ACT inhaler Inhale 2 puffs into the lungs every 6 (six) hours as needed for wheezing or shortness of breath. 8 g 3   Alcohol Swabs (B-D SINGLE USE SWABS REGULAR) PADS USE 1 PAD DAILY WHEN  CHECKING BLOOD SUGAR. R73.03 100 each 3   BAC 50-325-40 MG tablet TAKE 1 TABLET BY MOUTH EVERY 6 HOURS AS NEEDED FOR HEADACHE. 20 tablet 0   blood glucose meter kit and supplies Dispense based on patient and insurance preference. Use up to four times daily as directed. (FOR ICD-10 E10.9, E11.9). 1 each 0   busPIRone (BUSPAR) 10 MG tablet Take 1 tablet (10 mg total) by mouth 2 (two) times daily as needed. 60 tablet 5   carvedilol (COREG) 3.125 MG tablet Take 0.5 tablets (1.5625 mg total) by mouth 2 (two) times daily with a meal. BP over 150 before taken 180 tablet 1   celecoxib (CELEBREX) 200 MG capsule TAKE 1 CAPSULE BY MOUTH TWICE DAILY BETWEEN MEALS AS NEEDED. (Patient taking differently: Take 200 mg by mouth 2 (two) times daily as needed for mild pain.) 60 capsule 0   dexlansoprazole (DEXILANT) 60 MG capsule Take 1 capsule (60 mg total) by mouth daily. 90 capsule 1   diclofenac Sodium (VOLTAREN) 1 % GEL APPLY 4 GRAMS TOPICALLY 4 TIMES A DAY. 100 g 0   DULoxetine (CYMBALTA) 60 MG capsule Take 1 capsule (60 mg total) by mouth at bedtime. 90 capsule 1   empagliflozin (JARDIANCE) 25 MG TABS tablet Take 1 tablet (25 mg total) by mouth daily before breakfast. 90 tablet 3   escitalopram (LEXAPRO) 20 MG tablet Take 1 tablet (20 mg total) by mouth daily. 90 tablet 1   estradiol (ESTRACE) 0.1 MG/GM vaginal cream Place 1 Applicatorful vaginally at bedtime. 42.5 g 6   FIBER PO Take 2 tablets by mouth in the morning, at noon, and at bedtime.     fludrocortisone (FLORINEF) 0.1 MG tablet TAKE 1 TABLET EVERY OTHER DAY. 15 tablet 2   fluticasone (FLONASE) 50 MCG/ACT nasal spray USE 1 SPRAY IN EACH NOSTRIL ONCE DAILY. (Patient taking differently: Place 1 spray into both nostrils daily as needed for allergies.) 16 g 5   fluticasone furoate-vilanterol (BREO ELLIPTA) 200-25 MCG/INH AEPB Inhale 1 puff into the lungs daily. 60 each 5   furosemide (LASIX) 40 MG tablet Take 1 tablet (40 mg total) by mouth daily as needed  for fluid. 90 tablet 1   glucose blood (ACCU-CHEK GUIDE) test strip Test BS up to 4 times a day Dx E11.9 100 strip 11   hydrALAZINE (APRESOLINE) 25 MG tablet TAKE 1 TABLET BY MOUTH 3 TIMES DAILY AS NEEDED (FOR SEVERE HYPERTENSION/SYSTOLIC NUMBER  170 OR GREATER). 90 tablet 3   HYDROcodone-acetaminophen (NORCO) 7.5-325 MG tablet Take 1 tablet by mouth in the morning, at noon, and at bedtime. 90 tablet 0   ipratropium (ATROVENT) 0.02 % nebulizer solution USE 1 VIAL (3ML) IN NEBULIZER EVERY 6 HOURS AS NEEDED FOR WHEEZING OR SHORTNESS OF BREATH. (Patient taking differently: Take 3 mLs by nebulization every 6 (six) hours as needed for wheezing or shortness of breath.) 150 mL 2   isosorbide mononitrate (IMDUR) 30 MG 24 hr tablet Take 1 tablet (30 mg total) by mouth daily. 90 tablet 1   lamoTRIgine (LAMICTAL) 150 MG tablet Take 1 tablet (150 mg total) by mouth daily. 90 tablet 1   levETIRAcetam (KEPPRA) 500 MG tablet TAKE (1) TABLET TWICE DAILY. 60 tablet 0   levocetirizine (XYZAL) 5 MG tablet TAKE 1 TABLET BY MOUTH EVERY MORNING. 30 tablet 5   lidocaine (XYLOCAINE) 5 % ointment APPLY TO AFFECTED AREA 3 TIMES A DAY AS NEEDED FOR MILD OR MODERATE PAIN. 35.44 g 0   methocarbamol (ROBAXIN) 500 MG tablet Take 500 mg by mouth every 6 (six) hours as needed for muscle spasms.     nitroGLYCERIN (NITROSTAT) 0.4 MG SL tablet PLACE ONE (1) TABLET UNDER TONGUE EVERY 5 MINUTES UP TO (3) DOSES AS NEEDED FOR CHEST PAIN. 25 tablet 0   ondansetron (ZOFRAN ODT) 4 MG disintegrating tablet Take 1 tablet (4 mg total) by mouth every 8 (eight) hours as needed for nausea or vomiting. 20 tablet 0   oxybutynin (DITROPAN-XL) 5 MG 24 hr tablet TAKE 1 TABLET BY MOUTH AT BEDTIME. 30 tablet 0   Probiotic Product (PROBIOTIC PO) Take 1 capsule by mouth daily.     prochlorperazine (COMPAZINE) 5 MG tablet Take by mouth.     promethazine (PHENERGAN) 12.5 MG tablet Take by mouth.     rosuvastatin (CRESTOR) 10 MG tablet Take 1 tablet (10 mg  total) by mouth daily. 90 tablet 1   tiZANidine (ZANAFLEX) 4 MG tablet TAKE 1 TABLET EVERY 8 HOURS AS NEEDED FOR SEVERE PAIN. 60 tablet 0   traZODone (DESYREL) 150 MG tablet Take 1 tablet (150 mg total) by mouth at bedtime. 90 tablet 1   valACYclovir (VALTREX) 1000 MG tablet TAKE 1 TABLET 3 TIMES A DAY. TO BE TAKEN ONLY WHEN SHE HAS OUT BREAK. 21 tablet 2   No current facility-administered medications for this visit.     Past Surgical History:  Procedure Laterality Date   BACK SURGERY     BREAST SURGERY     lumpectomy   CARDIAC CATHETERIZATION  10/06/2011   CATARACT EXTRACTION W/PHACO Right 07/31/2017   Procedure: CATARACT EXTRACTION PHACO AND INTRAOCULAR LENS PLACEMENT (IOC);  Surgeon: [name], MD;  Location: [facility];  Service: Ophthalmology;  Laterality: Right;  CDE: 2.33   CATARACT EXTRACTION W/PHACO Left 08/14/2017   Procedure: CATARACT EXTRACTION PHACO AND INTRAOCULAR LENS PLACEMENT (IOC);  Surgeon: [name], MD;  Location: [facility];  Service: Ophthalmology;  Laterality: Left;  CDE: 2.74   CHOLECYSTECTOMY     CYSTOSCOPY     stone   DIAGNOSTIC LAPAROSCOPY     laparoscopic cholecystectomy   DOPPLER ECHOCARDIOGRAPHY  2009   ESOPHAGOGASTRODUODENOSCOPY N/A 10/31/2020   Procedure: ESOPHAGOGASTRODUODENOSCOPY (EGD);  Surgeon: Greer Pickerel, MD;  Location: [facility];  Service: General;  Laterality: N/A;   head up tilt table testing  06/15/2007   [name]   HEMORRHOID SURGERY     insertion of implatable loop recorder  08/11/2007   [name]  POSTERIOR CERVICAL FUSION/FORAMINOTOMY N/A 12/19/2013   Procedure: RIGHT C3-4.C4-5 AND C5-6 FORAMINOTOMIES;  Surgeon: [name], MD;  Location: [place];  Service: Orthopedics;  Laterality: N/A;   TOTAL HIP ARTHROPLASTY Left 01/31/2020   Procedure: LEFT TOTAL HIP ARTHROPLASTY ANTERIOR APPROACH;  Surgeon: Mcarthur Rossetti, MD;  Location: [place];  Service: Orthopedics;  Laterality: Left;   TOTAL SHOULDER ARTHROPLASTY Right 11/15/2019    Procedure: RIGHT TOTAL SHOULDER ARTHROPLASTY;  Surgeon: Meredith Pel, MD;  Location: [facility];  Service: Orthopedics;  Laterality: Right;   TUBAL LIGATION     UPPER GASTROINTESTINAL ENDOSCOPY     XI ROBOTIC ASSISTED HIATAL HERNIA REPAIR N/A 10/31/2020   Procedure: XI ROBOTIC ASSISTED HIATAL HERNIA REPAIR WITH PARTIAL FUNDOPLICATION;  Surgeon: Greer Pickerel, MD;  Location: [facility];  Service: General;  Laterality: N/A;     Allergies  Allergen Reactions   Codeine Other (See Comments)    "I will have a heart attack."   Morphine And Related Other (See Comments)    "It will cause me to have a heart attack."   Ambien [Zolpidem Tartrate] Nausea And Vomiting   Clonidine Derivatives Nausea And Vomiting    gerd - caused acid reflux per pt   Metformin And Related     Nausea/vomiting/cramping from metformin   Lyrica [Pregabalin] Swelling and Other (See Comments)    Weight gain   Neurontin [Gabapentin] Other (See Comments)    Causes elevated LFTs       Family History  Problem Relation Age of Onset   Heart attack Father    Mental illness Father    Mental illness Mother    Heart attack Brother        stents   Alcohol abuse Brother    Heart disease Brother    Drug abuse Brother    Diabetes Brother    Colon cancer Maternal Aunt    Cirrhosis Brother    Stomach cancer Neg Hx    Esophageal cancer Neg Hx    Rectal cancer Neg Hx      Social History Ms. Kreft reports that she has never smoked. She has never used smokeless tobacco. Ms. Thayne reports no history of alcohol use.   Review of Systems CONSTITUTIONAL: No weight loss, fever, chills, weakness or fatigue.  HEENT: Eyes: No visual loss, blurred vision, double vision or yellow sclerae.No hearing loss, sneezing, congestion, runny nose or sore throat.  SKIN: No rash or itching.  CARDIOVASCULAR: per hpi RESPIRATORY: No shortness of breath, cough or sputum.  GASTROINTESTINAL: No anorexia, nausea, vomiting or diarrhea. No abdominal  pain or blood.  GENITOURINARY: No burning on urination, no polyuria NEUROLOGICAL: No headache, dizziness, syncope, paralysis, ataxia, numbness or tingling in the extremities. No change in bowel or bladder control.  MUSCULOSKELETAL: No muscle, back pain, joint pain or stiffness.  LYMPHATICS: No enlarged nodes. No history of splenectomy.  PSYCHIATRIC: No history of depression or anxiety.  ENDOCRINOLOGIC: No reports of sweating, cold or heat intolerance. No polyuria or polydipsia.  [name]   Physical Examination Today's Vitals   03/14/21 1319  BP: 126/76  Pulse: 64  SpO2: 95%  Weight: 124 lb (56.2 kg)  Height: 5' 2" (1.575 m)   Body mass index is 22.68 kg/m.  Gen: resting comfortably, no acute distress HEENT: no scleral icterus, pupils equal round and reactive, no palptable cervical adenopathy,  CV: RRR, no mrg, no jvd Resp: Clear to auscultation bilaterally GI: abdomen is soft, non-tender, non-distended, normal bowel sounds, no hepatosplenomegaly MSK:  extremities are warm, no edema.  Skin: warm, no rash Neuro:  no focal deficits Psych: appropriate affect   Diagnostic Studies 05/2011 Lexiscan MPI No ischemia     Jan 2009 Echo LEFT VENTRICLE: - Left ventricular size was normal. - Overall left ventricular systolic function was normal. - There were no left ventricular regional wall motion abnormalities. - Left ventricular wall thickness was normal.  AORTIC VALVE: - The aortic valve was trileaflet. - Aortic valve thickness was normal.  AORTA: - The aortic root was normal in size. - The aortic arch was normal.  MITRAL VALVE: - Mitral valve structure was normal.  Doppler interpretation(s): - There was trivial mitral valvular regurgitation.  LEFT ATRIUM: - Left atrial size was normal.  PULMONARY VEINS: - The pulmonary veins were grossly normal.  RIGHT VENTRICLE: - Right ventricular size was normal. - Right ventricular systolic function was normal. - Right  ventricular wall thickness was normal.  PULMONIC VALVE: - The structure of the pulmonic valve appeared to be normal.  TRICUSPID VALVE: - The tricuspid valve structure was normal.  Doppler interpretation(s): - There was no significant tricuspid valvular regurgitation.  PULMONARY ARTERY: - The pulmonary artery was normal size.  RIGHT ATRIUM: - Right atrial size was normal.  SYSTEMIC VEINS: - The inferior vena cava was normal.  PERICARDIUM: - There was no pericardial effusion.  ---------------------------------------------------------------  SUMMARY - Overall left ventricular systolic function was normal. There were no left ventricular regional wall motion abnormalities. - The pulmonary veins were grossly normal.     09/2011 Cath Hemodynamic Findings:   Central aortic pressure: 108/58   Left ventricular pressure: 107/10/14   Angiographic Findings:   Left main: No obstructive disease noted.   Left Anterior Descending Artery: Moderate to large sized vessel that courses to the apex. There is a moderate sized diagonal . No obstructive disease noted.   Circumflex Artery: Large, dominant artery with moderate sized first obtuse marginal  and left sided posterolateral  with no disease noted.   Right Coronary Artery: Small, non-dominant vessel with no disease noted.   Left Ventricular Angiogram:LVEF=65%.   Impression:   1. No angiographic evidence of CAD   2. Normal LV systolic function   3. Non-cardiac chest pain   Recommendations: No further ischemic workup.   Complications: None. The patient tolerated the procedure well.     11/2014 echo Study Conclusions  - Left ventricle: The cavity size was normal. Systolic function was   normal. The estimated ejection fraction was in the range of 55%   to 60%. Wall motion was normal; there were no regional wall   motion abnormalities. There was an increased relative   contribution of atrial contraction to ventricular  filling.   Doppler parameters are consistent with abnormal left ventricular   relaxation (grade 1 diastolic dysfunction). - Mitral valve: There was trivial regurgitation. - Tricuspid valve: There was trivial regurgitation. - Pulmonic valve: There was trivial regurgitation.      09/2017 nuclear stress There was no ST segment deviation noted during stress. Nonspecific T wave flattening in aVL and V2 seen throughout study. Defect 1: There is a medium defect of mild severity present in the mid inferior, apical inferior and apical lateral location. This appears to be due to soft tissue attenuation given normal regional wall motion. No ischemic territories. This is a low risk study. Nuclear stress EF: 65%.      Assessment and Plan  1. Syncope/Autonomic dysfunction/HTN - difficult balance between severe symptomatic both hypotension and  HTN.  -unorthodox regimen but through trial and error has done fairly well for her all things considred and we have continued   2. Chest pain - long history of symptoms with multiple negative stress tests, most recently 09/2017  Lexiscan was negative. Cath 2013 with patent vessels. Symptoms did improve with imdur -some ongoing symptoms, she is interestd in trying high dose imdur, will increase to 20m daily.    3. Hyperlipidemia - at goal, continue crestor.   F/u 6 months    JArnoldo Lenis M.D.

## 2021-03-14 NOTE — Patient Instructions (Signed)
Medication Instructions:  Increase Imdur to 45mg  daily  Continue all other medications.     Labwork: none  Testing/Procedures: none  Follow-Up: 6 months   Any Other Special Instructions Will Be Listed Below (If Applicable).   If you need a refill on your cardiac medications before your next appointment, please call your pharmacy.

## 2021-04-01 ENCOUNTER — Other Ambulatory Visit: Payer: Self-pay | Admitting: Nurse Practitioner

## 2021-04-01 DIAGNOSIS — B009 Herpesviral infection, unspecified: Secondary | ICD-10-CM

## 2021-04-02 ENCOUNTER — Encounter: Payer: Self-pay | Admitting: Nurse Practitioner

## 2021-04-02 ENCOUNTER — Other Ambulatory Visit: Payer: Self-pay | Admitting: Nurse Practitioner

## 2021-04-02 ENCOUNTER — Ambulatory Visit (INDEPENDENT_AMBULATORY_CARE_PROVIDER_SITE_OTHER): Payer: 59 | Admitting: Nurse Practitioner

## 2021-04-02 VITALS — BP 126/83 | HR 78 | Temp 98.2°F | Resp 20 | Ht 62.0 in | Wt 119.0 lb

## 2021-04-02 DIAGNOSIS — G8929 Other chronic pain: Secondary | ICD-10-CM | POA: Diagnosis not present

## 2021-04-02 DIAGNOSIS — B009 Herpesviral infection, unspecified: Secondary | ICD-10-CM

## 2021-04-02 DIAGNOSIS — M47817 Spondylosis without myelopathy or radiculopathy, lumbosacral region: Secondary | ICD-10-CM | POA: Diagnosis not present

## 2021-04-02 MED ORDER — HYDROCODONE-ACETAMINOPHEN 7.5-325 MG PO TABS: 1.0000 | ORAL_TABLET | Freq: Four times a day (QID) | ORAL | 0 refills | Status: DC | PRN

## 2021-04-02 MED ORDER — HYDROCODONE-ACETAMINOPHEN 7.5-325 MG PO TABS: 1.0000 | ORAL_TABLET | Freq: Four times a day (QID) | ORAL | 0 refills | Status: AC | PRN

## 2021-04-02 NOTE — Progress Notes (Signed)
Subjective:    Patient ID: IRLENE SODERQUIST, female    DOB: 1955/12/29, 66 y.o.   MRN: [id number]   Chief Complaint: pain management  HPI  Pain assessment: Cause of pain- spondylosis Pain location- lower back-she has had multiple back surgeries Pain on scale of 1-10- 5/10 today Frequency- daily What increases pain-to much activity What makes pain Better-rest and pain meds Effects on ADL - none Any change in general medical condition-none  Current opioids rx- norco 7.5/325 TID # meds rx- 90 Effectiveness of current meds-helps most days Adverse reactions from pain meds-none Morphine equivalent- 30 MME  Pill count performed-No Last drug screen - 09/28/20 ( high risk q54m, moderate risk q58m, low risk yearly ) Urine drug screen today- No Was the NCCSR reviewed- yes  If yes were their any concerning findings? - no   Overdose risk: 1  Pain contract signed on:04/02/20     Review of Systems  Constitutional:  Negative for diaphoresis.  Eyes:  Negative for pain.  Respiratory:  Negative for shortness of breath.   Cardiovascular:  Negative for chest pain, palpitations and leg swelling.  Gastrointestinal:  Negative for abdominal pain.  Endocrine: Negative for polydipsia.  Musculoskeletal:  Positive for arthralgias, back pain and neck pain.  Skin:  Negative for rash.  Neurological:  Negative for dizziness, weakness and headaches.  Hematological:  Does not bruise/bleed easily.  All other systems reviewed and are negative.     Objective:   Physical Exam Vitals reviewed.  Constitutional:      Appearance: Normal appearance.  Cardiovascular:     Rate and Rhythm: Normal rate and regular rhythm.     Heart sounds: Normal heart sounds.  Pulmonary:     Effort: Pulmonary effort is normal.     Breath sounds: Normal breath sounds.  Skin:    General: Skin is warm.  Neurological:     General: No focal deficit present.     Mental Status: She is alert and oriented to person, place,  and time.     Cranial Nerves: No cranial nerve deficit.     Sensory: No sensory deficit.    BP 126/83    Pulse 78    Temp 98.2 F (36.8 C) (Temporal)    Resp 20    Ht 5\' 2"  (1.575 m)    Wt 119 lb (54 kg)    SpO2 97%    BMI 21.77 kg/m        Assessment & Plan:  KORRYN MONTANARI in today with chief complaint of Pain Management   1. Lumbosacral spondylosis without myelopathy Meds ordered this encounter  Medications   HYDROcodone-acetaminophen (NORCO) 7.5-325 MG tablet    Sig: Take 1 tablet by mouth every 6 (six) hours as needed for moderate pain.    Dispense:  90 tablet    Refill:  0    Order Specific Question:   Supervising Provider    Answer:   Arville Care A [[id number]]   HYDROcodone-acetaminophen (NORCO) 7.5-325 MG tablet    Sig: Take 1 tablet by mouth every 6 (six) hours as needed for moderate pain.    Dispense:  90 tablet    Refill:  0    Order Specific Question:   Supervising Provider    Answer:   Arville Care A [[id number]]   HYDROcodone-acetaminophen (NORCO) 7.5-325 MG tablet    Sig: Take 1 tablet by mouth every 6 (six) hours as needed for moderate pain.    Dispense:  90 tablet  Refill:  0    Order Specific Question:   Supervising Provider    Answer:   Arville Care A [[id number]]     2. Other chronic pain Pain meds as prescribed    The above assessment and management plan was discussed with the patient. The patient verbalized understanding of and has agreed to the management plan. Patient is aware to call the clinic if symptoms persist or worsen. Patient is aware when to return to the clinic for a follow-up visit. Patient educated on when it is appropriate to go to the emergency department.   [name] Daphine Deutscher, FNP

## 2021-04-02 NOTE — Patient Instructions (Signed)
Opioid Pain Medicine Management ?Opioid pain medicines are strong medicines that are used to treat bad or very bad pain. When you take them for a short time, they can help you: ?Sleep better. ?Do better in physical therapy. ?Feel better during the first few days after you get hurt. ?Recover from surgery. ?Only take these medicines if a doctor says that you can. You should only take them for a short time. This is because opioids can be very addictive. This means that they are hard to stop taking. The longer you take opioids, the harder it may be to stop taking them. ?What are the risks? ?Opioids can cause problems (side effects). Taking them for more than 3 days raises your chance of problems, such as: ?Trouble pooping (constipation). ?Feeling sick to your stomach (nausea). ?Vomiting. ?Feeling very sleepy. ?Confusion. ?Not being able to stop taking the medicine. ?Breathing problems. ?Taking opioids for a long time can make it hard for you to do daily tasks. It can also put you at risk for: ?Car accidents. ?Depression. ?Suicide. ?Heart attack. ?Taking too much of the medicine (overdose). This can lead to death. ?What is a pain treatment plan? ?A pain treatment plan is a plan made by you and your doctor. Work with your doctor to make a plan for treating your pain. To help you do this: ?Talk about the goals of your treatment, including: ?How much pain you might expect to have. ?How you will manage the pain. ?Talk about the risks and benefits of taking these medicines for your condition. ?Remember that a good treatment plan uses more than one approach and lowers the risks of side effects. ?Tell your doctor about the amount of medicines you take and about any drug or alcohol use. ?Get your pain medicine prescriptions from only one doctor. ?Pain can be managed with other treatments. Work with your doctor to find other ways to help your pain, such as: ?Physical therapy or doing gentle exercises. ?Counseling. ?Eating healthy  foods. ?Massage. ?Meditation. ?Other pain medicines. ?How to use opioid pain medicine safely ?Taking medicine ?Take your pain medicine exactly as told by your doctor. Take it only when you need it. ?If your pain is not too bad, you may take less medicine if your doctor allows. ?If you have no pain, do not take the medicine unless your doctor tells you to take it. ?If your pain is very bad, do not take more medicine than your doctor told you to take. Call your doctor to know what to do. ?Write down the times when you take your pain medicine. Look at the times before you take your next dose. ?Take other over-the-counter or prescription medicines only as told by your doctor. ?Keeping yourself and others safe ? ?While you are taking opioids: ?Do not drive, use machines, or power tools. ?Do not sign important papers (legal documents). ?Do not drink alcohol. ?Do not take sleeping pills. ?Do not take care of children by yourself. ?Do not do activities where you need to climb or be in high places, like working on a ladder. ?Do not go to a lake, river, ocean, swimming pool, or hot tub. ?Keep your opioids locked up or in a place where children cannot reach them. ?Do not share your pain medicine with anyone. ?Stopping your use of opioids ?If you have been taking opioids for more than a few weeks, you may need to slowly decrease (taper) how much you take until you stop taking them. Doing this can lower your chance   of having symptoms.  ?Symptoms that come from suddenly stopping the use of opioids include: ?Pain and cramping in your belly (abdomen). ?Feeling sick to your stomach (nausea).z ?Sweating. ?Feeling very sleepy. ?Feeling restless. ?Shaking you cannot control (tremors). ?Cravings for the medicine. ?Do not try to stop taking them by yourself. Work with your doctor to stop. Your doctor will help you take less until you are not taking the medicine at all. ?Getting rid of unused pills ?Do not save any pills that you did not  use. Get rid of the pills by: ?Taking them to a take-back program in your area. ?Bringing them to a pharmacy that receives unused pills. ?Flushing them down the toilet. Check the label or package insert of your medicine to see whether this is safe to do. ?Throwing them in the trash. Check the label or package insert of your medicine to see whether this is safe to do. If it is safe to throw them out: ?Take the pills out of their container. ?Put the pills into a container you can seal. ?Mix the pills with used coffee grounds, food scraps, dirt, or cat litter. ?Put this in the trash. ?Follow these instructions at home: ?Activity ?Do exercises as told by your doctor. ?Avoid doing things that make your pain worse. ?Return to your normal activities as told by your doctor. Ask your doctor what activities are safe for you. ?General instructions ?You may need to take these actions to prevent or treat constipation: ?Drink enough fluid to keep your pee (urine) pale yellow. ?Take over-the-counter or prescription medicines. ?Eat foods that are high in fiber. These include beans, whole grains, and fresh fruits and vegetables. ?Limit foods that are high in fat and sugar. These include fried or sweet foods. ?Keep all follow-up visits. ?Where to find support ?If you have been taking opioids for a long time, get help from a local support group or counselor. Ask your doctor about this. ?Where to find more information ?Centers for Disease Control and Prevention (CDC): www.cdc.gov ?[place] Food and Drug Administration (FDA): www.fda.gov ?Get help right away if: ?You may have taken too much of an opioid (overdosed). Common symptoms of an overdose: ?Your breathing is slower or more shallow than normal. ?You have a very slow heartbeat. ?Your speech is not normal. ?You vomit or you feel as if you may vomit. ?The black centers of your eyes (pupils) are smaller than normal. ?You have other potential symptoms: ?You feel very confused. ?You  faint. ?You are very sleepy. ?You have cold skin. ?You have blue lips or fingernails. ?You have thoughts of harming yourself or harming others. ?These symptoms may be an emergency. Get help right away. Call your local emergency services (911 in the [place] ?Do not wait to see if the symptoms will go away. ?Do not drive yourself to the hospital. ?Get help right away if you feel like you may hurt yourself or others, or have thoughts about taking your own life. Go to your nearest emergency room or: ?Call your local emergency services (911 in the [place] ?Call the National Poison Control Center at 1-800-222-1222. ?Call a suicide crisis helpline, such as the National Suicide Prevention Lifeline at 1-800-273-8255 or 988 in the [place] This is open 24 hours a day. ?Text the Crisis Text Line at 741741. ?Summary ?Opioid are strong medicines that are used to treat bad or very bad pain. ?A pain treatment plan is a plan made by you and your doctor. Work with your doctor to make a   plan for treating your pain. ?If you think that you or someone else may have taken too much of an opioid, get help right away. ?This information is not intended to replace advice given to you by your health care provider. Make sure you discuss any questions you have with your health care provider. ?Document Revised: 10/10/2020 Document Reviewed: 06/27/2020 ?Elsevier Patient Education ? 2022 [employer] ? ?

## 2021-04-09 ENCOUNTER — Telehealth: Payer: Self-pay | Admitting: Nurse Practitioner

## 2021-04-09 MED ORDER — FLUTICASONE FUROATE-VILANTEROL 100-25 MCG/ACT IN AEPB: 1.0000 | INHALATION_SPRAY | Freq: Every day | RESPIRATORY_TRACT | 11 refills | Status: DC

## 2021-04-09 NOTE — Telephone Encounter (Signed)
Insurance is requesting brand name breo instead of the generic.

## 2021-04-11 ENCOUNTER — Other Ambulatory Visit: Payer: Self-pay | Admitting: Nurse Practitioner

## 2021-04-11 DIAGNOSIS — B009 Herpesviral infection, unspecified: Secondary | ICD-10-CM

## 2021-04-11 NOTE — Telephone Encounter (Signed)
Ltrex was refilled on 04/01/21. Are you taking it daily or only when have outbreak?

## 2021-04-11 NOTE — Telephone Encounter (Signed)
See previous note

## 2021-04-12 MED ORDER — VALACYCLOVIR HCL 500 MG PO TABS: 500.0000 mg | ORAL_TABLET | Freq: Two times a day (BID) | ORAL | 1 refills | Status: DC

## 2021-04-15 ENCOUNTER — Ambulatory Visit: Payer: Self-pay

## 2021-04-15 ENCOUNTER — Encounter: Payer: Self-pay | Admitting: Orthopaedic Surgery

## 2021-04-15 ENCOUNTER — Ambulatory Visit (INDEPENDENT_AMBULATORY_CARE_PROVIDER_SITE_OTHER): Payer: 59 | Admitting: Orthopaedic Surgery

## 2021-04-15 DIAGNOSIS — Z96642 Presence of left artificial hip joint: Secondary | ICD-10-CM

## 2021-04-15 MED ORDER — LIDOCAINE 5 % EX PTCH: 1.0000 | MEDICATED_PATCH | CUTANEOUS | 3 refills | Status: DC

## 2021-04-15 NOTE — Progress Notes (Signed)
The patient is now just over a year out from a left hip replacement.  She still feels like there is muscle pain is bothering her and is never really stopped hurting.  She has had extensive lumbar spine surgery in the past.  She is a thin individual.  On exam she walks without a limp.  Her left operative hip moves smoothly and fluidly.  An [facility] pelvis shows a well-seated total hip arthroplasty on the left side with no complicating features.  She did show me where she was hurting inferior and proximal to the incision over small area.  I decided to at least place a steroid injection with 1 cc lidocaine 1 cc Depo-Medrol around this area.  I will also order some Lidoderm patches to try.  I am out of also what else that I could recommend.  Follow-up from my standpoint at this point is as needed.

## 2021-04-18 ENCOUNTER — Ambulatory Visit (INDEPENDENT_AMBULATORY_CARE_PROVIDER_SITE_OTHER): Payer: 59 | Admitting: Orthopedic Surgery

## 2021-04-18 ENCOUNTER — Ambulatory Visit: Payer: Self-pay

## 2021-04-18 ENCOUNTER — Other Ambulatory Visit: Payer: Self-pay

## 2021-04-18 DIAGNOSIS — Z96611 Presence of right artificial shoulder joint: Secondary | ICD-10-CM

## 2021-04-21 ENCOUNTER — Encounter: Payer: Self-pay | Admitting: Orthopedic Surgery

## 2021-04-21 NOTE — Progress Notes (Signed)
Office Visit Note   Patient: [name]           Date of Birth: 02-04-1956           MRN: [id number] Visit Date: 04/18/2021 Requested by: Bennie Pierini, FNP [address] [place],  [place] [address] PCP: Bennie Pierini, FNP  Subjective: Chief Complaint  Patient presents with   Right Shoulder - Pain    HPI: [name] is a patient with right shoulder pain and instability.  She underwent total shoulder replacement 11/15/2019.  Did have some tendinosis of the subscap at that time.  Convertible baseplate was placed.  She has been having pain and instability for the past 3 months.  The patient states that the shoulder keeps popping in and out.  Usually out for about 10 seconds.  This is bothering her enough for surgery.             ROS: All systems reviewed are negative as they relate to the chief complaint within the history of present illness.  Patient denies  fevers or chills.   Assessment & Plan: Visit Diagnoses:  1. 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  We should be able to convert this to a reverse replacement.  Risk and benefits are discussed with the patient including not limited to infection nerve vessel damage instability as well as incomplete restoration of function.  Patient understands risk benefits and wishes to proceed.  Does not appear to be infected at this time.  All questions answered  Follow-Up Instructions: No follow-ups on file.   Orders:  Orders Placed This Encounter  Procedures   XR Shoulder Right   No orders of the defined types were placed in this encounter.     Procedures: No procedures performed   Clinical Data: No additional findings.  Objective: Vital Signs: There were no vitals taken for this visit.  Physical Exam:   Constitutional: Patient appears well-developed HEENT:  Head:  Normocephalic Eyes:EOM are normal Neck: Normal range of motion Cardiovascular: Normal rate Pulmonary/chest: Effort normal Neurologic: Patient is alert Skin: Skin is warm Psychiatric: Patient has normal mood and affect   Ortho Exam: Ortho exam demonstrates well-healed surgical incision on the right-hand side.  Deltoid is functional.  Patient has external rotation on the right to 90 degrees and on the left to about 70.  Subscap strength is weaker on the right compared to the left.  She does have more anterior instability on the right compared to the left with some apprehension with AB duction and external rotation.  Motor or sensory function to the hand is intact.  Specialty Comments:  No specialty comments available.  Imaging: No results found.   PMFS History: Patient Active Problem List   Diagnosis Date Noted   Hypokalemia 11/25/2020   S/P laparoscopic fundoplication 10/31/2020   Status post total replacement of left hip 01/31/2020   Left ear pain 01/10/2020   Non-recurrent acute serous otitis media of both ears 01/10/2020   Unilateral primary osteoarthritis, left hip 12/21/2019   S/P shoulder replacement, right 11/15/2019   Overweight (BMI 25.0-29.9) 04/15/2019   Diverticulosis 04/14/2019   Chronic diastolic CHF (congestive heart failure) (HCC) 04/14/2019   Unstable angina (HCC) 10/20/2017   Chronic pain 09/23/2016   Pre-diabetes 09/02/2016   Recurrent major depressive disorder, in partial remission (HCC) 07/14/2016   Seizures (HCC) 07/14/2016   GAD (generalized  anxiety disorder) 07/14/2016   Insomnia 09/05/2014   Allergic rhinitis 09/05/2014   Cervical spondylosis without myelopathy 12/19/2013    Class: Chronic   Neural foraminal stenosis of cervical spine 12/19/2013   Cervical radiculitis 09/19/2013   Lumbosacral spondylosis without myelopathy 11/16/2012   Postlaminectomy syndrome, lumbar region 11/16/2012   Herpes simplex virus (HSV) infection 10/31/2008    Hyperlipidemia with target LDL less than 100 10/31/2008   Essential hypertension 10/31/2008   GERD 10/31/2008   SPINAL STENOSIS OF LUMBAR REGION 10/31/2008   Myalgia 10/31/2008   Osteoporosis 10/31/2008   SPONDYLOLISTHESIS 10/31/2008   SYNCOPE 10/31/2008   Sleep apnea 10/31/2008   Past Medical History:  Diagnosis Date   Allergy    Anemia    Anginal pain (HCC)    last time    Anxiety    Arthritis    RHEUMATOID   Asthma    Bipolar 1 disorder (HCC)    Blood transfusion without reported diagnosis    Cataracts, bilateral 07/2017   CHF (congestive heart failure) (HCC)    COPD (chronic obstructive pulmonary disease) (HCC)    Coronary artery disease    reported hx of "MI";  Echo 2009 with normal LVF;  Myoview 05/2011: no ischemia   Depression    Diabetes mellitus without complication (HCC)    Dyslipidemia    Dysrhythmia    SVT   Esophageal stricture    Fibromyalgia    GERD (gastroesophageal reflux disease)    H/O hiatal hernia    Head injury, unspecified    Headache    migraines   Herpes simplex infection    History of kidney stones    History of loop recorder    Managed by Dr. Sharrell Ku   Hyperlipidemia    Hypertension    Insomnia    Myocardial infarction [facility])    age 17   Osteoporosis    Pneumonia    hx   Seizures (HCC)    Shortness of breath    Sleep apnea    ? neg   Spinal stenosis of lumbar region    Spondylolisthesis    Status post placement of implantable loop recorder    Supraventricular tachycardia (HCC)    Syncope and collapse    s/p ILR; no arhythmogenic cause identified   UTI (lower urinary tract infection)     Family History  Problem Relation Age of Onset   Heart attack Father    Mental illness Father    Mental illness Mother    Heart attack Brother        stents   Alcohol abuse Brother    Heart disease Brother    Drug abuse Brother    Diabetes Brother    Colon cancer Maternal Aunt    Cirrhosis Brother    Stomach cancer Neg Hx     Esophageal cancer Neg Hx    Rectal cancer Neg Hx     Past Surgical History:  Procedure Laterality Date   BACK SURGERY     BREAST SURGERY     lumpectomy   CARDIAC CATHETERIZATION  10/06/2011   CATARACT EXTRACTION W/PHACO Right 07/31/2017   Procedure: CATARACT EXTRACTION PHACO AND INTRAOCULAR LENS PLACEMENT (IOC);  Surgeon: Fabio Pierce, MD;  Location: [facility];  Service: Ophthalmology;  Laterality: Right;  CDE: 2.33   CATARACT EXTRACTION W/PHACO Left 08/14/2017   Procedure: CATARACT EXTRACTION PHACO AND INTRAOCULAR LENS PLACEMENT (IOC);  Surgeon: Fabio Pierce, MD;  Location: [facility];  Service: Ophthalmology;  Laterality: Left;  CDE:  2.74   CHOLECYSTECTOMY     CYSTOSCOPY     stone   DIAGNOSTIC LAPAROSCOPY     laparoscopic cholecystectomy   DOPPLER ECHOCARDIOGRAPHY  2009   ESOPHAGOGASTRODUODENOSCOPY N/A 10/31/2020   Procedure: ESOPHAGOGASTRODUODENOSCOPY (EGD);  Surgeon: Gaynelle AduWilson, Eric, MD;  Location: [facility];  Service: General;  Laterality: N/A;   head up tilt table testing  06/15/2007   Lewayne BuntingGregg Taylor   HEMORRHOID SURGERY     insertion of implatable loop recorder  08/11/2007   Lewayne BuntingGregg Taylor   POSTERIOR CERVICAL FUSION/FORAMINOTOMY N/A 12/19/2013   Procedure: RIGHT C3-4.C4-5 AND C5-6 FORAMINOTOMIES;  Surgeon: Kerrin ChampagneJames E Nitka, MD;  [facility];  Service: Orthopedics;  Laterality: N/A;   TOTAL HIP ARTHROPLASTY Left 01/31/2020   Procedure: LEFT TOTAL HIP ARTHROPLASTY ANTERIOR APPROACH;  Surgeon: Kathryne HitchBlackman, Christopher Y, MD;  Location: [facility];  Service: Orthopedics;  Laterality: Left;   TOTAL SHOULDER ARTHROPLASTY Right 11/15/2019   Procedure: RIGHT TOTAL SHOULDER ARTHROPLASTY;  Surgeon: Cammy Copaean, Costantino Kohlbeck Scott, MD;  Location: [facility];  Service: Orthopedics;  Laterality: Right;   TUBAL LIGATION     UPPER GASTROINTESTINAL ENDOSCOPY     XI ROBOTIC ASSISTED HIATAL HERNIA REPAIR N/A 10/31/2020   Procedure: XI ROBOTIC ASSISTED HIATAL HERNIA REPAIR WITH PARTIAL FUNDOPLICATION;  Surgeon: Gaynelle AduWilson, Eric, MD;  Location:  [facility];  Service: General;  Laterality: N/A;   Social History   Occupational History   Occupation: Disability    Comment: 15 years  Tobacco Use   Smoking status: Never   Smokeless tobacco: Never  Vaping Use   Vaping Use: Never used  Substance and Sexual Activity   Alcohol use: No    Alcohol/week: 0.0 standard drinks   Drug use: No   Sexual activity: Not Currently

## 2021-04-25 ENCOUNTER — Other Ambulatory Visit: Payer: Self-pay

## 2021-04-30 ENCOUNTER — Ambulatory Visit: Payer: 59 | Admitting: Nurse Practitioner

## 2021-04-30 ENCOUNTER — Other Ambulatory Visit: Payer: Self-pay | Admitting: Nurse Practitioner

## 2021-04-30 DIAGNOSIS — B009 Herpesviral infection, unspecified: Secondary | ICD-10-CM

## 2021-04-30 DIAGNOSIS — R569 Unspecified convulsions: Secondary | ICD-10-CM

## 2021-05-02 ENCOUNTER — Other Ambulatory Visit: Payer: Self-pay | Admitting: Cardiology

## 2021-05-06 NOTE — Progress Notes (Addendum)
Surgical Instructions   Your procedure is scheduled on Thursday 05/09/2021.  Report to [facility] Main Entrance "A" at 09:10 A.M., then check in with the Admitting office.  Call [phone] if you have problems or questions between now and the morning of surgery:   Remember: Do not eat after midnight the night before your surgery  You may drink clear liquids until 08:10 a.m. the morning of your surgery.   Clear liquids allowed are: Water, Non-Citrus Juices (without pulp), Carbonated Beverages, Clear Tea, Black Coffee Only (NO MILK, CREAM, or POWDERED CREAMER of any kind), and Gatorade   Take these medicines the morning of surgery with A SIP OF WATER:  Carvedilol (Coreg) Dexlansoprazole (Dexilant) Escitalopram (Lexapro) Fluticasone Furoate-vilanterol (Breo-ellipta) - Please bring all inhalers with you the day of surgery.  Isosorbide mononitrate Lamotrigine (Lamictal) Levetiracetam (Keppra) Rosuvastatin (Crestor) Valacyclovir (Valtrex)   If needed you may take these medications the morning of surgery: Albuterol (Ventolin HFA) inhaler - Please bring all inhalers with you the day of surgery.  Buspirone (Buspar) Butalbital-acetaminophen-caffeine (Fioricet) Hydralizine (Apresoline) Hydrocodone-acetaminophen (Norco) Ipratropium (Atrovent) nebulizer solution Levocetirizine (Xyzal) Nitroglycerin (Nitrostat) Ondansetron (Zofran) Tizanidine (Zanaflex)  As of today, STOP taking any Celecoxib (Celebrex), Aspirin (unless otherwise instructed by your surgeon) or Aspirin-containing products; NSAIDS - Aleve, Naproxen, Ibuprofen, Motrin, Advil, Goody's, BC's, all herbal medications, fish oil, and all vitamins.  WHAT DO I DO ABOUT MY DIABETES MEDICATION?  DO NOT take empagliflozin (Jardiance) to day before surgery (Wednesday 05/08/21) or the morning of surgery (Thursday 05/09/21)   HOW TO MANAGE YOUR DIABETES BEFORE AND AFTER SURGERY  Why is it important to control my blood sugar before and  after surgery? Improving blood sugar levels before and after surgery helps healing and can limit problems. A way of improving blood sugar control is eating a healthy diet by:  Eating less sugar and carbohydrates  Increasing activity/exercise  Talking with your doctor about reaching your blood sugar goals High blood sugars (greater than 180 mg/dL) can raise your risk of infections and slow your recovery, so you will need to focus on controlling your diabetes during the weeks before surgery. Make sure that the doctor who takes care of your diabetes knows about your planned surgery including the date and location.  How do I manage my blood sugar before surgery? Check your blood sugar at least 4 times a day, starting 2 days before surgery, to make sure that the level is not too high or low.  Check your blood sugar the morning of your surgery when you wake up and every 2 hours until you get to the Short Stay unit.  If your blood sugar is less than 70 mg/dL, you will need to treat for low blood sugar: Do not take insulin. Treat a low blood sugar (less than 70 mg/dL) with  cup of clear juice (cranberry or apple), 4 glucose tablets, OR glucose gel. Recheck blood sugar in 15 minutes after treatment (to make sure it is greater than 70 mg/dL). If your blood sugar is not greater than 70 mg/dL on recheck, call [phone] for further instructions. Report your blood sugar to the short stay nurse when you get to Short Stay.  If you are admitted to the hospital after surgery: Your blood sugar will be checked by the staff and you will probably be given insulin after surgery (instead of oral diabetes medicines) to make sure you have good blood sugar levels. The goal for blood sugar control after surgery is 80-180 mg/dL.  3 days leading up to your surgery or after your pre-procedure COVID test You are not required to quarantine however you are required to wear a well-fitting mask when you are out and  around people not in your household.  If your mask becomes wet or soiled, replace with a new one.  Wash your hands often with soap and water for 20 seconds or clean your hands with an alcohol-based hand sanitizer that contains at least 60% alcohol.  Do not share personal items.  Notify your provider: if you are in close contact with someone who has COVID  or if you develop a fever of 100.4 or greater, sneezing, cough, sore throat, shortness of breath or body aches.          Do not wear jewelry or makeup  Do not wear lotions, powders, perfumes/colognes, or deodorant.  Do not shave 48 hours prior to surgery.  Men may shave face and neck.  Do not wear nail polish, gel polish, artificial nails, or any other type of covering on natural nails including fingernails and toenails. If patients have artificial nails, gel coating, etc. that need to be removed by a nail salon please have this removed prior to surgery or surgery may need to be canceled/delayed if the surgeon/ anesthesia feels like the patient is unable to be adequately monitored.  Do not bring valuables to the hospital - [facility] is not responsible for any belongings or valuables.  Do NOT Smoke (Tobacco/Vaping) or drink Alcohol 24 hours prior to your procedure  If you use a CPAP at night, please bring your mask for your overnight stay.   Contacts, glasses, hearing aids, dentures or partials may not be worn into surgery, please bring cases for these belongings   For patients admitted to the hospital, discharge time will be determined by your treatment team.   Patients discharged the day of surgery will not be allowed to drive home, and someone needs to stay with them for 24 hours.  NO VISITORS WILL BE ALLOWED IN PRE-OP WHERE PATIENTS ARE PREPPED FOR SURGERY.  ONLY 1 SUPPORT PERSON MAY BE PRESENT IN THE WAITING ROOM WHILE YOU ARE IN SURGERY.  IF YOU ARE TO BE ADMITTED, ONCE YOU ARE IN YOUR ROOM YOU WILL BE ALLOWED TWO (2) VISITORS.  1 (ONE) VISITOR MAY STAY OVERNIGHT BUT MUST ARRIVE TO THE ROOM BY 8pm.  Minor children may have two parents present. Special consideration for safety and communication needs will be reviewed on a case by case basis.  Special instructions:    Oral Hygiene is also important to reduce your risk of infection.  Remember - BRUSH YOUR TEETH THE MORNING OF SURGERY WITH YOUR REGULAR TOOTHPASTE  [place]- Preparing for Total Shoulder Arthroplasty  Before surgery, you can play an important role. Because skin is not sterile, your skin needs to be as free of germs as possible. You can reduce the number of germs on your skin by using the following products.  Benzoyl Peroxide Gel Reduces the number of germs present on the skin Applied twice a day to shoulder area starting two days before surgery  Chlorhexidine Gluconate (CHG) Soap (instructions listed above on how to wash with CHG Soap) An antiseptic cleaner that kills germs and bonds with the skin to continue killing germs even after washing Used for showering the night before surgery and morning of surgery  ==================================================================  Please follow these instructions carefully:  BENZOYL PEROXIDE 5% GEL  Please do not use if you  have an allergy to benzoyl peroxide. If your skin becomes reddened/irritated stop using the benzoyl peroxide.  Starting two days before surgery, apply as follows:  1. Apply benzoyl peroxide in the morning and at night. Apply after taking a shower. If you are not taking a shower clean entire shoulder front, back, and side along with the armpit with a clean wet washcloth.  2. Place a quarter-sized dollop on your SHOULDER and rub in thoroughly, making sure to cover the front, back, and side of your shoulder, along with the armpit.   2 Days prior to Surgery First Dose on _____________ Morning Second Dose on ______________ Night  Day Before Surgery First Dose on ______________  Morning Night before surgery wash (entire body except face and private areas) with CHG Soap THEN Second Dose on ____________ Night   Morning of Surgery  wash BODY AGAIN with CHG Soap   3. Do NOT apply benzoyl peroxide gel on the day of surgery   [place]- Preparing For Surgery  Before surgery, you can play an important role. Because skin is not sterile, your skin needs to be as free of germs as possible. You can reduce the number of germs on your skin by washing with CHG (chlorahexidine gluconate) Soap before surgery.  CHG is an antiseptic cleaner which kills germs and bonds with the skin to continue killing germs even after washing.     Please do not use if you have an allergy to CHG or antibacterial soaps. If your skin becomes reddened/irritated stop using the CHG.  Do not shave (including legs and underarms) for at least 48 hours prior to first CHG shower. It is OK to shave your face.  Please follow these instructions carefully.     Shower the NIGHT BEFORE SURGERY and the MORNING OF SURGERY with CHG Soap.   If you chose to wash your hair, wash your hair first as usual with your normal shampoo. After you shampoo, rinse your hair and body thoroughly to remove the shampoo.  Then [employer] and genitals (private parts) with your normal soap and rinse thoroughly to remove soap.  After that Use CHG Soap as you would any other liquid soap. You can apply CHG directly to the skin and wash gently with a scrungie or a clean washcloth.   Apply the CHG Soap to your body ONLY FROM THE NECK DOWN.  Do not use on open wounds or open sores. Avoid contact with your eyes, ears, mouth and genitals (private parts). Wash Face and genitals (private parts)  with your normal soap.   Wash thoroughly, paying special attention to the area where your surgery will be performed.  Thoroughly rinse your body with warm water from the neck down.  DO NOT shower/wash with your normal soap after using and rinsing off  the CHG Soap.  Pat yourself dry with a CLEAN TOWEL.  8. Apply the Benzoyl Peroxide only the night before surgery.  Do Not use it the morning of surgery.  Wear CLEAN PAJAMAS to bed the night before surgery  Place CLEAN SHEETS on your bed the night before your surgery  DO NOT SLEEP WITH PETS.   Day of Surgery: Take a shower with CHG soap. Wear Clean/Comfortable clothing the morning of surgery Do not apply any deodorants/lotions.   Remember to brush your teeth WITH YOUR REGULAR TOOTHPASTE.   Please read over the fact sheets that you were given.

## 2021-05-07 ENCOUNTER — Encounter: Payer: Self-pay | Admitting: Nurse Practitioner

## 2021-05-07 ENCOUNTER — Other Ambulatory Visit: Payer: Self-pay

## 2021-05-07 ENCOUNTER — Encounter (HOSPITAL_COMMUNITY): Payer: Self-pay

## 2021-05-07 ENCOUNTER — Encounter (HOSPITAL_COMMUNITY)
Admission: RE | Admit: 2021-05-07 | Discharge: 2021-05-07 | Disposition: A | Discharge status: Home / Self Care | Payer: 59 | Source: Ambulatory Visit | Attending: Orthopedic Surgery | Admitting: Orthopedic Surgery

## 2021-05-07 ENCOUNTER — Ambulatory Visit (INDEPENDENT_AMBULATORY_CARE_PROVIDER_SITE_OTHER): Payer: 59 | Admitting: Nurse Practitioner

## 2021-05-07 VITALS — BP 141/89 | HR 102 | Temp 99.3°F | Resp 17 | Ht 62.0 in | Wt 122.6 lb

## 2021-05-07 VITALS — Temp 99.2°F

## 2021-05-07 DIAGNOSIS — R7303 Prediabetes: Secondary | ICD-10-CM | POA: Insufficient documentation

## 2021-05-07 DIAGNOSIS — M791 Myalgia, unspecified site: Secondary | ICD-10-CM

## 2021-05-07 DIAGNOSIS — U071 COVID-19: Secondary | ICD-10-CM

## 2021-05-07 DIAGNOSIS — Z01812 Encounter for preprocedural laboratory examination: Secondary | ICD-10-CM | POA: Diagnosis present

## 2021-05-07 DIAGNOSIS — I10 Essential (primary) hypertension: Secondary | ICD-10-CM | POA: Insufficient documentation

## 2021-05-07 DIAGNOSIS — Z01818 Encounter for other preprocedural examination: Secondary | ICD-10-CM

## 2021-05-07 LAB — BASIC METABOLIC PANEL
Anion gap: 8 (ref 5–15)
BUN: 9 mg/dL (ref 8–23)
CO2: 31 mmol/L (ref 22–32)
Calcium: 9.4 mg/dL (ref 8.9–10.3)
Chloride: 104 mmol/L (ref 98–111)
Creatinine, Ser: 0.65 mg/dL (ref 0.44–1.00)
GFR, Estimated: >60 mL/min (ref >60)
Glucose, Bld: 95 mg/dL (ref 70–99)
Potassium: 3.9 mmol/L (ref 3.5–5.1)
Sodium: 143 mmol/L (ref 135–145)

## 2021-05-07 LAB — CBC
HCT: 47 % — ABNORMAL HIGH (ref 36.0–46.0)
Hemoglobin: 15.1 g/dL — ABNORMAL HIGH (ref 12.0–15.0)
MCH: 31.6 pg (ref 26.0–34.0)
MCHC: 32.1 g/dL (ref 30.0–36.0)
MCV: 98.3 fL (ref 80.0–100.0)
Platelets: 126 10*3/uL — ABNORMAL LOW (ref 150–400)
RBC: 4.78 MIL/uL (ref 3.87–5.11)
RDW: 12.4 % (ref 11.5–15.5)
WBC: 5.6 10*3/uL (ref 4.0–10.5)
nRBC: 0 % (ref 0.0–0.2)

## 2021-05-07 LAB — HEMOGLOBIN A1C
Hgb A1c MFr Bld: 5.8 % — ABNORMAL HIGH (ref 4.8–5.6)
Mean Plasma Glucose: 119.76 mg/dL

## 2021-05-07 LAB — GLUCOSE, CAPILLARY: Glucose-Capillary: 146 mg/dL — ABNORMAL HIGH (ref 70–99)

## 2021-05-07 LAB — SURGICAL PCR SCREEN
MRSA, PCR: NEGATIVE
Staphylococcus aureus: NEGATIVE

## 2021-05-07 MED ORDER — MOLNUPIRAVIR EUA 200MG CAPSULE: 4.0000 | ORAL_CAPSULE | Freq: Two times a day (BID) | ORAL | 0 refills | Status: AC

## 2021-05-07 MED ORDER — LOPERAMIDE HCL 2 MG PO TABS
2.0000 mg | ORAL_TABLET | Freq: Four times a day (QID) | ORAL | 0 refills | Status: DC | PRN
Start: 2021-05-07 — End: 2021-05-30

## 2021-05-07 MED ORDER — DM-GUAIFENESIN ER 30-600 MG PO TB12: 1.0000 | ORAL_TABLET | Freq: Two times a day (BID) | ORAL | 0 refills | Status: DC

## 2021-05-07 NOTE — Progress Notes (Addendum)
Virtual Visit  Note Due to COVID-19 pandemic this visit was conducted virtually. This visit type was conducted due to national recommendations for restrictions regarding the COVID-19 Pandemic (e.g. social distancing, sheltering in place) in an effort to limit this patient's exposure and mitigate transmission in our community. All issues noted in this document were discussed and addressed.  A physical exam was not performed with this format.  I connected with [name] on 05/07/21 at 8:00 am by telephone and verified that I am speaking with the correct person using two identifiers. [name] is currently located at home during visit. The provider, Daryll Drown, NP is located in their office at time of visit.  I discussed the limitations, risks, security and privacy concerns of performing an evaluation and management service by telephone and the availability of in person appointments. I also discussed with the patient that there may be a patient responsible charge related to this service. The patient expressed understanding and agreed to proceed.   History and Present Illness:  URI  This is a new problem. Episode onset: The past 3 days. The problem has been gradually worsening. The maximum temperature recorded prior to her arrival was 100.4 - 100.9 F. Associated symptoms include congestion, coughing, diarrhea and a sore throat. Pertinent negatives include no ear pain, headaches or rash. She has tried nothing for the symptoms.  Cough This is a new problem. The current episode started yesterday. The problem has been unchanged. The problem occurs constantly. The cough is Productive of sputum. Associated symptoms include chills, a fever, nasal congestion and a sore throat. Pertinent negatives include no ear congestion, ear pain, headaches or rash. Nothing aggravates the symptoms. She has tried nothing for the symptoms.  Fever  This is a new problem. The current episode started  yesterday. The problem occurs intermittently. The problem has been gradually improving. The maximum temperature noted was 100 to 100.9 F. Associated symptoms include congestion, coughing, diarrhea and a sore throat. Pertinent negatives include no ear pain, headaches or rash. She has tried acetaminophen for the symptoms. The treatment provided mild relief.  Risk factors: no contaminated food      Review of Systems  Constitutional:  Positive for chills and fever.  HENT:  Positive for congestion and sore throat. Negative for ear pain.   Respiratory:  Positive for cough.   Cardiovascular: Negative.   Gastrointestinal:  Positive for diarrhea.  Skin: Negative.  Negative for rash.  Neurological:  Negative for headaches.  All other systems reviewed and are negative.   Observations/Objective: Televisit patient not in distress.  Assessment and Plan: Positive for COVID-19  Take meds as prescribed - Use a cool mist humidifier  -Use saline nose sprays frequently -Force fluids -For fever or aches or pains- take Tylenol or ibuprofen. -Molnupirirvir antiviral by mouth daily -Guaifenesin for cough and congestion. -Imodium for diarrhea.   Follow Up Instructions: Local worsening hours of symptoms.    I discussed the assessment and treatment plan with the patient. The patient was provided an opportunity to ask questions and all were answered. The patient agreed with the plan and demonstrated an understanding of the instructions.   The patient was advised to call back or seek an in-person evaluation if the symptoms worsen or if the condition fails to improve as anticipated.  The above assessment and management plan was discussed with the patient. The patient verbalized understanding of and has agreed to the management plan. Patient is aware to call the  clinic if symptoms persist or worsen. Patient is aware when to return to the clinic for a follow-up visit. Patient educated on when it is  appropriate to go to the emergency department.   Time call ended: 8:12 AM  I provided 12 minutes of  non face-to-face time during this encounter.    Daryll Drown, NP

## 2021-05-07 NOTE — Progress Notes (Addendum)
PCP - [name] Hassell Done with [facility] Cardiologist - Dr. Harl Bowie and Dr. Enid Skeens recorder that is old and battery dead  Chest x-ray - Not indicated EKG - 11/26/20 Stress Test - 10/05/17 ECHO - 11/26/20 Cardiac Cath - patient states she has had a cardiac cath cannot recall date. She also states no blockage seen. Saw in surgical history a date of 10/06/11  Sleep Study - Yes has OSA CPAP - Never received the machine  DM - Type II Fasting Blood Sugar - 100-300 fasting per patient CBG at PAT appt 146 fasting Checks Blood Sugar __3___ times a day  COVID TEST- 05/07/21  Anesthesia review: Yes cardiac history  Patient denies shortness of breath, fever, cough and chest pain at PAT appointment.  Patient later 30 stated to me she had taken a home Covid test four days ago that was positive. She has a low grade fever today of 99.3 and  has had diarhhea. No other symptoms.    All instructions explained to the patient, with a verbal understanding of the material. Patient agrees to go over the instructions while at home for a better understanding. Patient also instructed to wear a mask while in public after being tested for COVID-19. The opportunity to ask questions was provided.

## 2021-05-07 NOTE — Progress Notes (Signed)
Notified Dr. Alfonso Patteneans office of positive COVID home test on 05/03/21. Patient will need to reschedule surgery for after 05/14/21. Patient made aware of update.

## 2021-05-08 ENCOUNTER — Telehealth: Payer: Self-pay | Admitting: *Deleted

## 2021-05-08 NOTE — Telephone Encounter (Signed)
° °  Name: [name]  DOB: [birth year]  MRN: [id number]   Primary Cardiologist: Dina Rich, MD  Chart reviewed as part of pre-operative protocol coverage. Patient was contacted 05/08/2021 in reference to pre-operative risk assessment for pending surgery as outlined below.  [name] was last seen 02/2021 by Dr. 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 Also has history of chest pain with multiple prior assessments including normal cath 2013, last stress test 2019 felt to be low risk. At last OV, Imdur was increased to 45mg  daily as this had symptomatically helped her in the past. Echo 10/2020 with normal EF, mild MR/AI. RCRI 0.4%. indicating low CV risk based on cardiac history. I reached out to patient for update on how she is doing. The patient affirms she has been doing well without any new cardiac symptoms. Has not had any syncope or CP since last visit. Able to perform ADLs, housework and yardwork, achieving > 4 METS, without angina or dydspnea. Therefore, based on ACC/AHA guidelines, the patient would be at acceptable risk for the planned procedure without further cardiovascular testing. The patient was advised that if she develops new symptoms prior to surgery to contact our office to arrange for a follow-up visit, and she verbalized understanding.   I will route this recommendation to the requesting party via Epic fax function and remove from pre-op pool. Please call with questions.  Laurann Montana, PA-C 05/08/2021, 12:59 PM

## 2021-05-08 NOTE — Telephone Encounter (Signed)
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 Cammy Copa Surgeon's Group or Practice Name:  [facility] Phone number:  [phone] Fax number:  [phone]   Type of Clearance Requested:   - Medical    Type of Anesthesia:  General    Additional requests/questions:   please fax to the attention of Ollen Gross   05/08/2021, 10:10 AM

## 2021-05-09 ENCOUNTER — Encounter (HOSPITAL_COMMUNITY): Admission: RE | Payer: Self-pay | Source: Home / Self Care

## 2021-05-09 ENCOUNTER — Ambulatory Visit (HOSPITAL_COMMUNITY): Admission: RE | Admit: 2021-05-09 | Payer: 59 | Source: Home / Self Care | Admitting: Orthopedic Surgery

## 2021-05-09 SURGERY — REVISION, TOTAL ARTHROPLASTY, SHOULDER
Anesthesia: General | Site: Shoulder | Laterality: Right

## 2021-05-13 ENCOUNTER — Other Ambulatory Visit: Payer: Self-pay

## 2021-05-24 ENCOUNTER — Encounter: Payer: 59 | Admitting: Orthopedic Surgery

## 2021-05-29 ENCOUNTER — Other Ambulatory Visit: Payer: Self-pay | Admitting: Nurse Practitioner

## 2021-05-30 ENCOUNTER — Other Ambulatory Visit: Payer: Self-pay | Admitting: Nurse Practitioner

## 2021-05-30 DIAGNOSIS — U071 COVID-19: Secondary | ICD-10-CM

## 2021-05-30 MED ORDER — LOPERAMIDE HCL 2 MG PO TABS: 2.0000 mg | ORAL_TABLET | Freq: Four times a day (QID) | ORAL | 0 refills | Status: DC | PRN

## 2021-06-06 NOTE — Pre-Procedure Instructions (Signed)
Surgical Instructions    Your procedure is scheduled on Tuesday, June 11, 2021 at 7:30 AM.  Report to [facility] Main Entrance "A" at 5:30 A.M., then check in with the Admitting office.  Call this number if you have problems the morning of surgery:  [phone]   If you have any questions prior to your surgery date call [phone]: Open Monday-Friday 8am-4pm    Remember:  Do not eat after midnight the night before your surgery  You may drink clear liquids until 4:30 AM the morning of your surgery.   Clear liquids allowed are: Water, Non-Citrus Juices (without pulp), Carbonated Beverages, Clear Tea, Black Coffee Only (NO MILK, CREAM OR POWDERED CREAMER of any kind), and Gatorade.   Enhanced Recovery after Surgery for Orthopedics Enhanced Recovery after Surgery is a protocol used to improve the stress on your body and your recovery after surgery.  Patient Instructions  The day of surgery (if you have diabetes):  Drink ONE bottle of G2 by 4:30 am the morning of surgery This bottle was given to you during your hospital  pre-op appointment visit.  Nothing else to drink after completing the  Small 10 oz bottle of water.         If you have questions, please contact your surgeons office.     Take these medicines the morning of surgery with A SIP OF WATER:  busPIRone (BUSPAR) dexlansoprazole (DEXILANT) escitalopram (LEXAPRO)  fludrocortisone (FLORINEF) fluticasone furoate-vilanterol (BREO ELLIPTA) HYDROcodone-acetaminophen (NORCO) isosorbide mononitrate (IMDUR) lamoTRIgine (LAMICTAL) levETIRAcetam (KEPPRA) rosuvastatin (CRESTOR) valACYclovir (VALTREX)  IF NEEDED: albuterol (VENTOLIN HFA) carvedilol (COREG) fluticasone (FLONASE) hydrALAZINE (APRESOLINE)  ipratropium (ATROVENT) levocetirizine (XYZAL) loperamide (IMODIUM A-D) ondansetron (ZOFRAN)   Please bring all inhalers with you the day of surgery.    As of today, STOP taking any Aspirin (unless otherwise  instructed by your surgeon) Aleve, Naproxen, Ibuprofen, Motrin, Advil, Goody's, BC's, all herbal medications, fish oil, and all vitamins.  WHAT DO I DO ABOUT MY DIABETES MEDICATION?   Do not take empagliflozin (JARDIANCE) the day before or the morning of surgery.   HOW TO MANAGE YOUR DIABETES BEFORE AND AFTER SURGERY  Why is it important to control my blood sugar before and after surgery? Improving blood sugar levels before and after surgery helps healing and can limit problems. A way of improving blood sugar control is eating a healthy diet by:  Eating less sugar and carbohydrates  Increasing activity/exercise  Talking with your doctor about reaching your blood sugar goals High blood sugars (greater than 180 mg/dL) can raise your risk of infections and slow your recovery, so you will need to focus on controlling your diabetes during the weeks before surgery. Make sure that the doctor who takes care of your diabetes knows about your planned surgery including the date and location.  How do I manage my blood sugar before surgery? Check your blood sugar at least 4 times a day, starting 2 days before surgery, to make sure that the level is not too high or low.  Check your blood sugar the morning of your surgery when you wake up and every 2 hours until you get to the Short Stay unit.  If your blood sugar is less than 70 mg/dL, you will need to treat for low blood sugar: Do not take insulin. Treat a low blood sugar (less than 70 mg/dL) with  cup of clear juice (cranberry or apple), 4 glucose tablets, OR glucose gel. Recheck blood sugar in 15 minutes after treatment (to make  sure it is greater than 70 mg/dL). If your blood sugar is not greater than 70 mg/dL on recheck, call [phone] for further instructions. Report your blood sugar to the short stay nurse when you get to Short Stay.  If you are admitted to the hospital after surgery: Your blood sugar will be checked by the staff and you  will probably be given insulin after surgery (instead of oral diabetes medicines) to make sure you have good blood sugar levels. The goal for blood sugar control after surgery is 80-180 mg/dL.                      Do NOT Smoke (Tobacco/Vaping) for 24 hours prior to your procedure.  If you use a CPAP at night, you may bring your mask/headgear for your overnight stay.   Contacts, glasses, piercing's, hearing aid's, dentures or partials may not be worn into surgery, please bring cases for these belongings.    For patients admitted to the hospital, discharge time will be determined by your treatment team.   Patients discharged the day of surgery will not be allowed to drive home, and someone needs to stay with them for 24 hours.  NO VISITORS WILL BE ALLOWED IN PRE-OP WHERE PATIENTS ARE PREPPED FOR SURGERY.  ONLY 1 SUPPORT PERSON MAY BE PRESENT IN THE WAITING ROOM WHILE YOU ARE IN SURGERY.  IF YOU ARE TO BE ADMITTED, ONCE YOU ARE IN YOUR ROOM YOU WILL BE ALLOWED TWO (2) VISITORS. (1) VISITOR MAY STAY OVERNIGHT BUT MUST ARRIVE TO THE ROOM BY 8pm.  Minor children may have two parents present. Special consideration for safety and communication needs will be reviewed on a case by case basis.   Special instructions:                                     [facility]- Preparing for Total Shoulder Arthroplasty   Before surgery, you can play an important role. Because skin is not sterile, your skin needs to be as free of germs as possible. You can reduce the number of germs on your skin by using the following products. Benzoyl Peroxide Gel Reduces the number of germs present on the skin Applied twice a day to shoulder area starting two days before surgery   Chlorhexidine Gluconate (CHG) Soap An antiseptic cleaner that kills germs and bonds with the skin to continue killing germs even after washing Used for showering the night before surgery and morning of surgery   Oral Hygiene is also important to  reduce your risk of infection.                                    Remember - BRUSH YOUR TEETH THE MORNING OF SURGERY WITH YOUR REGULAR TOOTHPASTE  ==================================================================  Please follow these instructions carefully:  BENZOYL PEROXIDE 5% GEL  Please do not use if you have an allergy to benzoyl peroxide.   If your skin becomes reddened/irritated stop using the benzoyl peroxide.  Starting two days before surgery, apply as follows: Apply benzoyl peroxide in the morning and at night. Apply after taking a shower. If you are not taking a shower clean entire shoulder front, back, and side along with the armpit with a clean wet washcloth.  Place a quarter-sized dollop on your shoulder and rub in thoroughly,  making sure to cover the front, back, and side of your shoulder, along with the armpit.   2 days before ____ AM   ____ PM              1 day before ____ AM   ____ PM                             Do this twice a day for two days.  (Last application is the night before surgery, AFTER using the CHG soap as described below).  Do NOT apply benzoyl peroxide gel on the day of surgery.  CHLORHEXIDINE GLUCONATE (CHG) SOAP  Please do not use if you have an allergy to CHG or antibacterial soaps. If your skin becomes reddened/irritated stop using the CHG.   Do not shave (including legs and underarms) for at least 48 hours prior to first CHG shower. It is OK to shave your face.  Starting the night before surgery, use CHG soap as follows:  Shower the NIGHT BEFORE SURGERY and MORNING OF SURGERY with CHG.  If you choose to wash your hair, wash your hair first as usual with your normal shampoo.  After shampooing, rinse your hair and body thoroughly to remove the shampoo.  Use CHG as you would any other liquid soap.  You can apply CHG directly to the skin and wash gently with a scrungie or a clean washcloth.  Apply the CHG soap to your body ONLY FROM THE  NECK DOWN.  Do not use on open wounds or open sores.  Avoid contact with your eyes, ears, mouth, and genitals (private parts).  Wash face and genitals (private parts) with your normal soap.  Wash thoroughly, paying special attention to the area where your surgery will be performed.  Thoroughly rinse your body with warm water from the neck down.  DO NOT shower/wash with your normal soap after using and rinsing off the CHG soap.   Pat yourself dry with a CLEAN TOWEL.    Apply benzoyl peroxide.   Wear CLEAN PAJAMAS to bed the night before surgery; wear comfortable clothes the morning of surgery.  Place CLEAN SHEETS on your bed the night of your first shower and DO NOT SLEEP WITH PETS.  Day of Surgery: Take a shower with CHG soap. Do not wear jewelry or makeup Do not wear lotions, powders, perfumes, or deodorant. Do not shave 48 hours prior to surgery. Do not bring valuables to the hospital.  [facility] is not responsible for any belongings or valuables. Do not wear nail polish, gel polish, artificial nails, or any other type of covering on natural nails (fingers and toes) If you have artificial nails or gel coating that need to be removed by a nail salon, please have this removed prior to surgery. Artificial nails or gel coating may interfere with anesthesia's ability to adequately monitor your vital signs. Wear Clean/Comfortable clothing the morning of surgery Do not apply any deodorants/lotions.   Remember to brush your teeth WITH YOUR REGULAR TOOTHPASTE.   Please read over the following fact sheets that you were given.   3 days prior to your procedure or After your COVID test   You are not required to quarantine however you are required to wear a well-fitting mask when you are out and around people not in your household. If your mask becomes wet or soiled, replace with a new one.   Wash your hands often with  soap and water for 20 seconds or clean your hands with an  alcohol-based hand sanitizer that contains at least 60% alcohol.   Do not share personal items.   Notify your provider:  o if you are in close contact with someone who has COVID  o or if you develop a fever of 100.4 or greater, sneezing, cough, sore throat, shortness of breath or body aches.

## 2021-06-07 ENCOUNTER — Other Ambulatory Visit: Payer: Self-pay

## 2021-06-07 ENCOUNTER — Encounter (HOSPITAL_COMMUNITY): Payer: Self-pay

## 2021-06-07 ENCOUNTER — Encounter (HOSPITAL_COMMUNITY)
Admission: RE | Admit: 2021-06-07 | Discharge: 2021-06-07 | Disposition: A | Discharge status: Home / Self Care | Payer: 59 | Source: Ambulatory Visit | Attending: Orthopedic Surgery | Admitting: Orthopedic Surgery

## 2021-06-07 VITALS — BP 104/78 | HR 74 | Temp 98.7°F | Resp 16 | Ht 62.0 in | Wt 120.9 lb

## 2021-06-07 DIAGNOSIS — E119 Type 2 diabetes mellitus without complications: Secondary | ICD-10-CM | POA: Insufficient documentation

## 2021-06-07 DIAGNOSIS — M75101 Unspecified rotator cuff tear or rupture of right shoulder, not specified as traumatic: Secondary | ICD-10-CM | POA: Diagnosis not present

## 2021-06-07 DIAGNOSIS — I509 Heart failure, unspecified: Secondary | ICD-10-CM | POA: Diagnosis not present

## 2021-06-07 DIAGNOSIS — Z01818 Encounter for other preprocedural examination: Secondary | ICD-10-CM

## 2021-06-07 DIAGNOSIS — N3001 Acute cystitis with hematuria: Secondary | ICD-10-CM | POA: Insufficient documentation

## 2021-06-07 DIAGNOSIS — I110 Hypertensive heart disease with heart failure: Secondary | ICD-10-CM | POA: Diagnosis not present

## 2021-06-07 DIAGNOSIS — Z01812 Encounter for preprocedural laboratory examination: Secondary | ICD-10-CM | POA: Diagnosis present

## 2021-06-07 DIAGNOSIS — J449 Chronic obstructive pulmonary disease, unspecified: Secondary | ICD-10-CM | POA: Diagnosis not present

## 2021-06-07 LAB — GLUCOSE, CAPILLARY: Glucose-Capillary: 105 mg/dL — ABNORMAL HIGH (ref 70–99)

## 2021-06-07 LAB — BASIC METABOLIC PANEL
Anion gap: 8 (ref 5–15)
BUN: 20 mg/dL (ref 8–23)
CO2: 29 mmol/L (ref 22–32)
Calcium: 9.4 mg/dL (ref 8.9–10.3)
Chloride: 104 mmol/L (ref 98–111)
Creatinine, Ser: 0.88 mg/dL (ref 0.44–1.00)
GFR, Estimated: >60 mL/min (ref >60)
Glucose, Bld: 94 mg/dL (ref 70–99)
Potassium: 4.5 mmol/L (ref 3.5–5.1)
Sodium: 141 mmol/L (ref 135–145)

## 2021-06-07 LAB — SURGICAL PCR SCREEN
MRSA, PCR: NEGATIVE
Staphylococcus aureus: NEGATIVE

## 2021-06-07 LAB — URINALYSIS, ROUTINE W REFLEX MICROSCOPIC
Bilirubin Urine: NEGATIVE
Glucose, UA: >=500 mg/dL — AB
Hgb urine dipstick: NEGATIVE
Ketones, ur: NEGATIVE mg/dL
Nitrite: NEGATIVE
Protein, ur: NEGATIVE mg/dL
Specific Gravity, Urine: 1.041 — ABNORMAL HIGH (ref 1.005–1.030)
pH: 5 (ref 5.0–8.0)

## 2021-06-07 LAB — CBC
HCT: 48 % — ABNORMAL HIGH (ref 36.0–46.0)
Hemoglobin: 15.7 g/dL — ABNORMAL HIGH (ref 12.0–15.0)
MCH: 32.5 pg (ref 26.0–34.0)
MCHC: 32.7 g/dL (ref 30.0–36.0)
MCV: 99.4 fL (ref 80.0–100.0)
Platelets: 182 10*3/uL (ref 150–400)
RBC: 4.83 MIL/uL (ref 3.87–5.11)
RDW: 12.2 % (ref 11.5–15.5)
WBC: 8.2 10*3/uL (ref 4.0–10.5)
nRBC: 0 % (ref 0.0–0.2)

## 2021-06-07 NOTE — Progress Notes (Signed)
PCP - [name] Daphine Deutscher FNP ?Cardiologist - Dina Rich MD ? ?PPM/ICD - denies ?Device Orders -  ?Rep Notified -  ?Pt reports she has a non-functioning loop recorder. ? ?Chest x-ray -  ?EKG - 11/26/20 ?Stress Test - 10/05/17 ?ECHO - 11/26/20 ?Cardiac Cath - 10/06/11 ? ?Sleep Study - 2014 ?CPAP - no ? ?Fasting Blood Sugar - 80-200's ?Checks Blood Sugar three times a day ? ?Blood Thinner Instructions:na  ?Aspirin Instructions:na ? ?ERAS Protcol - Clear liquids until 0430 ?PRE-SURGERY Ensure or G2- G2 ? ?COVID TEST- Pt tested positive for Covid in February 2023 so no Covid test was performed today. ? ? ?Anesthesia review: yes for cardiac history.  ? ?Patient denies shortness of breath, fever, cough and chest pain at PAT appointment ? ? ?All instructions explained to the patient, with a verbal understanding of the material. Patient agrees to go over the instructions while at home for a better understanding. Patient also instructed to wear a mask while out in public prior to her surgery.  The opportunity to ask questions was provided. ?  ?

## 2021-06-09 LAB — URINE CULTURE: Culture: NO GROWTH

## 2021-06-10 NOTE — Anesthesia Preprocedure Evaluation (Signed)
Anesthesia Evaluation  ° ° °Airway ° ° ° ° ° ° ° Dental °  °Pulmonary ° °  ° ° ° ° ° ° ° Cardiovascular °hypertension,  ° ° °  °Neuro/Psych °  ° GI/Hepatic °  °Endo/Other  °diabetes ° Renal/GU °  ° °  °Musculoskeletal ° ° Abdominal °  °Peds ° Hematology °  °Anesthesia Other Findings ° ° Reproductive/Obstetrics ° °  ° ° ° ° ° ° ° ° ° ° ° ° ° °  °  ° ° ° ° ° ° ° ° °Anesthesia Physical °Anesthesia Plan ° °ASA:  ° °Anesthesia Plan:   ° °Post-op Pain Management:   ° °Induction:  ° °PONV Risk Score and Plan:  ° °Airway Management Planned:  ° °Additional Equipment:  ° °Intra-op Plan:  ° °Post-operative Plan:  ° °Informed Consent:  ° °Plan Discussed with:  ° °Anesthesia Plan Comments: (See APP note by A. Gaetan Spieker, FNP )  ° ° ° ° ° ° °Anesthesia Quick Evaluation ° °

## 2021-06-10 NOTE — Progress Notes (Signed)
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   VS: BP 104/78    Pulse 74    Temp 37.1 C (Oral)    Resp 16    Ht _0  (1.575 m)    Wt 54.8 kg    SpO2 96%    BMI 22.11 kg/m   PROVIDERS: - PCP is Chevis Pretty, FNP - Cardiologist is Carlyle Dolly, MD, last office visit 03/14/21. Cleared for surgery at acceptable risk 05/08/21 by Melina Copa, PA - Used to see EP cardiologist [name], MD, last office visit 2019.   LABS: Labs reviewed: Acceptable for surgery. - HbA1c 5.8 on 05/07/21  (all labs ordered are listed, but only abnormal results are displayed)  Labs Reviewed  CBC - Abnormal; Notable for the following components:      Result Value   Hemoglobin 15.7 (*)    HCT 48.0 (*)    All other components within normal limits  URINALYSIS, ROUTINE W REFLEX MICROSCOPIC - Abnormal; Notable for the following components:   Specific Gravity, Urine 1.041 (*)    Glucose, UA >=500 (*)    Leukocytes,Ua TRACE (*)    Bacteria, UA RARE (*)    All other components within normal limits  GLUCOSE, CAPILLARY - Abnormal; Notable for the following components:   Glucose-Capillary 105  (*)    All other components within normal limits  URINE CULTURE  SURGICAL PCR SCREEN  BASIC METABOLIC PANEL    EKG 8/34/19: Sinus rhythm Left axis deviation Abnormal R-wave progression, early transition Nonspecific T abnrm, anterolateral leads Prolonged QT interval new t wave inversions since prior 1/21  CV: Echo 11/26/20:  1. Left ventricular ejection fraction, by estimation, is 60 to 65%. The left ventricle has normal function. The left ventricle has no regional wall motion abnormalities. Left ventricular diastolic parameters are indeterminate.   2. Right ventricular systolic function is normal. The right ventricular size is normal. Tricuspid regurgitation signal is inadequate for assessing PA pressure.   3. The mitral valve is grossly normal. Mild mitral valve regurgitation.   4. The aortic valve is tricuspid. There is mild calcification of the aortic valve. Aortic valve regurgitation is mild. No aortic stenosis is present.   5. The inferior vena cava is normal in size with greater than 50% respiratory variability, suggesting right atrial pressure of 3 mmHg.  - Comparison(s): Prior images reviewed side by side. Aortic regurgitation is overall mild.   Nuclear stress test 10/05/17:  There was no ST segment deviation noted during stress. Nonspecific T wave flattening in aVL and V2 seen throughout study. Defect 1: There is a medium defect of mild severity present in the mid inferior, apical inferior and  apical lateral location. This appears to be due to soft tissue attenuation given normal regional wall motion. No ischemic territories. This is a low risk study. Nuclear stress EF: 65%.   Cardiac cath 10/06/11:  1. No angiographic evidence of CAD 2. Normal LV systolic function 3. Non-cardiac chest pain   Past Medical History:  Diagnosis Date   Allergy    Anemia    Anginal pain ([place])    last time    Anxiety    Arthritis    RHEUMATOID   Asthma    Bipolar 1 disorder (HCC)     Blood transfusion without reported diagnosis    Cataracts, bilateral 07/2017   CHF (congestive heart failure) (HCC)    COPD (chronic obstructive pulmonary disease) (HCC)    Coronary artery disease    reported hx of "MI";  Echo 2009 with normal LVF;  Myoview 05/2011: no ischemia   Depression    Diabetes mellitus without complication (HCC)    Dyslipidemia    Dysrhythmia    SVT   Esophageal stricture    Fibromyalgia    GERD (gastroesophageal reflux disease)    H/O hiatal hernia    Head injury, unspecified    Headache    migraines   Herpes simplex infection    History of kidney stones    History of loop recorder    Managed by Dr. Crissie Sickles   Hyperlipidemia    Hypertension    Insomnia    Myocardial infarction [facility])    age 53   Osteoporosis    Pneumonia    hx   Seizures ([place])    Shortness of breath    Sleep apnea    ? neg   Spinal stenosis of lumbar region    Spondylolisthesis    Status post placement of implantable loop recorder    Supraventricular tachycardia (HCC)    Syncope and collapse    s/p ILR; no arhythmogenic cause identified   UTI (lower urinary tract infection)     Past Surgical History:  Procedure Laterality Date   BACK SURGERY     BREAST SURGERY     lumpectomy   CARDIAC CATHETERIZATION  10/06/2011   CATARACT EXTRACTION W/PHACO Right 07/31/2017   Procedure: CATARACT EXTRACTION PHACO AND INTRAOCULAR LENS PLACEMENT ([place]);  Surgeon: [name], MD;  Location: [facility];  Service: Ophthalmology;  Laterality: Right;  CDE: 2.33   CATARACT EXTRACTION W/PHACO Left 08/14/2017   Procedure: CATARACT EXTRACTION PHACO AND INTRAOCULAR LENS PLACEMENT ([facility]);  Surgeon: [name], MD;  Location: [facility];  Service: Ophthalmology;  Laterality: Left;  CDE: 2.74   CHOLECYSTECTOMY     CYSTOSCOPY     stone   DIAGNOSTIC LAPAROSCOPY     laparoscopic cholecystectomy   DOPPLER ECHOCARDIOGRAPHY  2009   ESOPHAGOGASTRODUODENOSCOPY N/A 10/31/2020   Procedure:  ESOPHAGOGASTRODUODENOSCOPY (EGD);  Surgeon: Greer Pickerel, MD;  Location: [facility];  Service: General;  Laterality: N/A;   EYE SURGERY Bilateral 08/14/2017   cataract removal   head up tilt table testing  06/15/2007   [name]   HEMORRHOID SURGERY     HERNIA REPAIR     insertion of implatable loop recorder  08/11/2007   [name]   POSTERIOR CERVICAL FUSION/FORAMINOTOMY N/A 12/19/2013   Procedure: RIGHT C3-4.C4-5 AND C5-6 FORAMINOTOMIES;  Surgeon: [name], MD;  Location: [place];  Service: Orthopedics;  Laterality: N/A;   TOTAL HIP ARTHROPLASTY Left 01/31/2020   Procedure: LEFT TOTAL HIP ARTHROPLASTY ANTERIOR APPROACH;  Surgeon: Mcarthur Rossetti,  MD;  Location: [place];  Service: Orthopedics;  Laterality: Left;   TOTAL SHOULDER ARTHROPLASTY Right 11/15/2019   Procedure: RIGHT TOTAL SHOULDER ARTHROPLASTY;  Surgeon: Meredith Pel, MD;  Location: [facility];  Service: Orthopedics;  Laterality: Right;   TUBAL LIGATION     UPPER GASTROINTESTINAL ENDOSCOPY     XI ROBOTIC ASSISTED HIATAL HERNIA REPAIR N/A 10/31/2020   Procedure: XI ROBOTIC ASSISTED HIATAL HERNIA REPAIR WITH PARTIAL FUNDOPLICATION;  Surgeon: Greer Pickerel, MD;  Location: [facility];  Service: General;  Laterality: N/A;    MEDICATIONS:  Accu-Chek Softclix Lancets lancets   acyclovir ointment (ZOVIRAX) 5 %   albuterol (VENTOLIN HFA) 108 (90 Base) MCG/ACT inhaler   Alcohol Swabs (B-D SINGLE USE SWABS REGULAR) PADS   blood glucose meter kit and supplies   busPIRone (BUSPAR) 10 MG tablet   butalbital-acetaminophen-caffeine (FIORICET) 50-325-40 MG tablet   carvedilol (COREG) 3.125 MG tablet   celecoxib (CELEBREX) 200 MG capsule   dexlansoprazole (DEXILANT) 60 MG capsule   dextromethorphan-guaiFENesin (MUCINEX DM) 30-600 MG 12hr tablet   diclofenac Sodium (VOLTAREN) 1 % GEL   DULoxetine (CYMBALTA) 60 MG capsule   empagliflozin (JARDIANCE) 25 MG TABS tablet   escitalopram (LEXAPRO) 20 MG tablet   estradiol (ESTRACE)  0.1 MG/GM vaginal cream   FIBER PO   fludrocortisone (FLORINEF) 0.1 MG tablet   fluticasone (FLONASE) 50 MCG/ACT nasal spray   fluticasone furoate-vilanterol (BREO ELLIPTA) 100-25 MCG/ACT AEPB   fluticasone furoate-vilanterol (BREO ELLIPTA) 200-25 MCG/ACT AEPB   furosemide (LASIX) 40 MG tablet   glucose blood (ACCU-CHEK GUIDE) test strip   hydrALAZINE (APRESOLINE) 25 MG tablet   HYDROcodone-acetaminophen (NORCO) 7.5-325 MG tablet   Ibuprofen-diphenhydrAMINE HCl (ADVIL PM) 200-25 MG CAPS   ipratropium (ATROVENT) 0.02 % nebulizer solution   isosorbide mononitrate (IMDUR) 30 MG 24 hr tablet   lamoTRIgine (LAMICTAL) 150 MG tablet   levETIRAcetam (KEPPRA) 500 MG tablet   levocetirizine (XYZAL) 5 MG tablet   lidocaine (LIDODERM) 5 %   loperamide (IMODIUM A-D) 2 MG tablet   nitroGLYCERIN (NITROSTAT) 0.4 MG SL tablet   ondansetron (ZOFRAN ODT) 4 MG disintegrating tablet   ondansetron (ZOFRAN) 4 MG tablet   oxybutynin (DITROPAN-XL) 5 MG 24 hr tablet   Probiotic Product (PROBIOTIC PO)   rosuvastatin (CRESTOR) 10 MG tablet   tiZANidine (ZANAFLEX) 4 MG tablet   traZODone (DESYREL) 150 MG tablet   valACYclovir (VALTREX) 1000 MG tablet   valACYclovir (VALTREX) 500 MG tablet   No current facility-administered medications for this encounter.    If no changes, I anticipate pt can proceed with surgery as scheduled.   Willeen Cass, PhD, FNP-BC [facility] Stay [facility]/Anesthesiology Phone: [phone] 06/10/2021 9:48 AM

## 2021-06-11 ENCOUNTER — Inpatient Hospital Stay (HOSPITAL_COMMUNITY): Payer: 59 | Admitting: Certified Registered Nurse Anesthetist

## 2021-06-11 ENCOUNTER — Other Ambulatory Visit: Payer: Self-pay

## 2021-06-11 ENCOUNTER — Inpatient Hospital Stay (HOSPITAL_COMMUNITY): Payer: 59 | Admitting: Emergency Medicine

## 2021-06-11 ENCOUNTER — Inpatient Hospital Stay (HOSPITAL_COMMUNITY)
Admission: RE | Admit: 2021-06-11 | Discharge: 2021-06-12 | DRG: 483 | Disposition: A | Discharge status: Home / Self Care | Payer: 59 | Attending: Orthopedic Surgery | Admitting: Orthopedic Surgery

## 2021-06-11 ENCOUNTER — Inpatient Hospital Stay (HOSPITAL_COMMUNITY): Payer: 59

## 2021-06-11 ENCOUNTER — Encounter (HOSPITAL_COMMUNITY): Payer: Self-pay | Admitting: Orthopedic Surgery

## 2021-06-11 ENCOUNTER — Encounter (HOSPITAL_COMMUNITY)
Admission: RE | Disposition: A | Discharge status: Home / Self Care | Payer: Self-pay | Source: Home / Self Care | Attending: Orthopedic Surgery

## 2021-06-11 DIAGNOSIS — Z7951 Long term (current) use of inhaled steroids: Secondary | ICD-10-CM | POA: Diagnosis not present

## 2021-06-11 DIAGNOSIS — E785 Hyperlipidemia, unspecified: Secondary | ICD-10-CM | POA: Diagnosis present

## 2021-06-11 DIAGNOSIS — J449 Chronic obstructive pulmonary disease, unspecified: Secondary | ICD-10-CM | POA: Diagnosis present

## 2021-06-11 DIAGNOSIS — Z833 Family history of diabetes mellitus: Secondary | ICD-10-CM

## 2021-06-11 DIAGNOSIS — I471 Supraventricular tachycardia: Secondary | ICD-10-CM | POA: Diagnosis present

## 2021-06-11 DIAGNOSIS — T84028A Dislocation of other internal joint prosthesis, initial encounter: Secondary | ICD-10-CM | POA: Diagnosis present

## 2021-06-11 DIAGNOSIS — G43909 Migraine, unspecified, not intractable, without status migrainosus: Secondary | ICD-10-CM | POA: Diagnosis present

## 2021-06-11 DIAGNOSIS — I110 Hypertensive heart disease with heart failure: Secondary | ICD-10-CM | POA: Diagnosis not present

## 2021-06-11 DIAGNOSIS — Z885 Allergy status to narcotic agent status: Secondary | ICD-10-CM | POA: Diagnosis not present

## 2021-06-11 DIAGNOSIS — Z813 Family history of other psychoactive substance abuse and dependence: Secondary | ICD-10-CM | POA: Diagnosis not present

## 2021-06-11 DIAGNOSIS — F419 Anxiety disorder, unspecified: Secondary | ICD-10-CM | POA: Diagnosis present

## 2021-06-11 DIAGNOSIS — Z811 Family history of alcohol abuse and dependence: Secondary | ICD-10-CM

## 2021-06-11 DIAGNOSIS — M069 Rheumatoid arthritis, unspecified: Secondary | ICD-10-CM | POA: Diagnosis present

## 2021-06-11 DIAGNOSIS — Z888 Allergy status to other drugs, medicaments and biological substances status: Secondary | ICD-10-CM

## 2021-06-11 DIAGNOSIS — Z96611 Presence of right artificial shoulder joint: Secondary | ICD-10-CM

## 2021-06-11 DIAGNOSIS — M48061 Spinal stenosis, lumbar region without neurogenic claudication: Secondary | ICD-10-CM | POA: Diagnosis present

## 2021-06-11 DIAGNOSIS — R569 Unspecified convulsions: Secondary | ICD-10-CM | POA: Diagnosis present

## 2021-06-11 DIAGNOSIS — Z9889 Other specified postprocedural states: Secondary | ICD-10-CM

## 2021-06-11 DIAGNOSIS — Z818 Family history of other mental and behavioral disorders: Secondary | ICD-10-CM | POA: Diagnosis not present

## 2021-06-11 DIAGNOSIS — M25311 Other instability, right shoulder: Secondary | ICD-10-CM

## 2021-06-11 DIAGNOSIS — I25119 Atherosclerotic heart disease of native coronary artery with unspecified angina pectoris: Secondary | ICD-10-CM | POA: Diagnosis not present

## 2021-06-11 DIAGNOSIS — Z96619 Presence of unspecified artificial shoulder joint: Secondary | ICD-10-CM

## 2021-06-11 DIAGNOSIS — Z8249 Family history of ischemic heart disease and other diseases of the circulatory system: Secondary | ICD-10-CM | POA: Diagnosis not present

## 2021-06-11 DIAGNOSIS — Y828 Other medical devices associated with adverse incidents: Secondary | ICD-10-CM | POA: Diagnosis present

## 2021-06-11 DIAGNOSIS — Z800 Family history of malignant neoplasm of digestive organs: Secondary | ICD-10-CM | POA: Diagnosis not present

## 2021-06-11 DIAGNOSIS — M797 Fibromyalgia: Secondary | ICD-10-CM | POA: Diagnosis present

## 2021-06-11 DIAGNOSIS — T84028D Dislocation of other internal joint prosthesis, subsequent encounter: Secondary | ICD-10-CM | POA: Diagnosis not present

## 2021-06-11 DIAGNOSIS — S46011A Strain of muscle(s) and tendon(s) of the rotator cuff of right shoulder, initial encounter: Secondary | ICD-10-CM | POA: Diagnosis not present

## 2021-06-11 DIAGNOSIS — I2510 Atherosclerotic heart disease of native coronary artery without angina pectoris: Secondary | ICD-10-CM | POA: Diagnosis present

## 2021-06-11 DIAGNOSIS — Z01818 Encounter for other preprocedural examination: Principal | ICD-10-CM

## 2021-06-11 LAB — GLUCOSE, CAPILLARY
Glucose-Capillary: 87 mg/dL (ref 70–99)
Glucose-Capillary: 122 mg/dL — ABNORMAL HIGH (ref 70–99)
Glucose-Capillary: 155 mg/dL — ABNORMAL HIGH (ref 70–99)
Glucose-Capillary: 116 mg/dL — ABNORMAL HIGH (ref 70–99)

## 2021-06-11 SURGERY — REVISION, TOTAL ARTHROPLASTY, SHOULDER
Anesthesia: General | Site: Shoulder | Laterality: Right

## 2021-06-11 MED ORDER — POVIDONE-IODINE 10 % EX SWAB
2.0000 "application " | Freq: Once | CUTANEOUS | Status: AC
  Administered 2021-06-11: 2 via TOPICAL

## 2021-06-11 MED ORDER — ORAL CARE MOUTH RINSE: 15.0000 mL | Freq: Once | OROMUCOSAL | Status: AC

## 2021-06-11 MED ORDER — DOCUSATE SODIUM 100 MG PO CAPS
100.0000 mg | ORAL_CAPSULE | Freq: Two times a day (BID) | ORAL | Status: DC
  Administered 2021-06-11 – 2021-06-12 (×2): 100 mg via ORAL
  Filled 2021-06-11 (×2): qty 1

## 2021-06-11 MED ORDER — PHENYLEPHRINE HCL-NACL 20-0.9 MG/250ML-% IV SOLN
INTRAVENOUS | Status: DC | PRN
  Administered 2021-06-11: 40 ug/min via INTRAVENOUS

## 2021-06-11 MED ORDER — IRRISEPT - 450ML BOTTLE WITH 0.05% CHG IN STERILE WATER, USP 99.95% OPTIME
TOPICAL | Status: DC | PRN
  Administered 2021-06-11: 450 mL via TOPICAL

## 2021-06-11 MED ORDER — ASPIRIN EC 81 MG PO TBEC
81.0000 mg | DELAYED_RELEASE_TABLET | Freq: Every day | ORAL | Status: DC
  Administered 2021-06-11: 81 mg via ORAL
  Filled 2021-06-11: qty 1

## 2021-06-11 MED ORDER — EPHEDRINE SULFATE-NACL 50-0.9 MG/10ML-% IV SOSY
PREFILLED_SYRINGE | INTRAVENOUS | Status: DC | PRN
  Administered 2021-06-11 (×2): 10 mg via INTRAVENOUS

## 2021-06-11 MED ORDER — HYDROMORPHONE HCL 1 MG/ML IJ SOLN
0.5000 mg | INTRAMUSCULAR | Status: DC | PRN
  Administered 2021-06-11: 0.5 mg via INTRAVENOUS
  Filled 2021-06-11: qty 0.5

## 2021-06-11 MED ORDER — PROPOFOL 10 MG/ML IV BOLUS
INTRAVENOUS | Status: DC | PRN
  Administered 2021-06-11: 120 mg via INTRAVENOUS

## 2021-06-11 MED ORDER — MENTHOL 3 MG MT LOZG: 1.0000 | LOZENGE | OROMUCOSAL | Status: DC | PRN

## 2021-06-11 MED ORDER — FENTANYL CITRATE (PF) 250 MCG/5ML IJ SOLN
INTRAMUSCULAR | Status: AC
  Filled 2021-06-11: qty 5

## 2021-06-11 MED ORDER — OXYCODONE HCL 5 MG PO TABS
5.0000 mg | ORAL_TABLET | ORAL | Status: DC | PRN
  Administered 2021-06-11 – 2021-06-12 (×6): 10 mg via ORAL
  Filled 2021-06-11 (×6): qty 2

## 2021-06-11 MED ORDER — FLUCONAZOLE 100 MG PO TABS
100.0000 mg | ORAL_TABLET | Freq: Every day | ORAL | Status: DC
  Administered 2021-06-11 – 2021-06-12 (×2): 100 mg via ORAL
  Filled 2021-06-11 (×3): qty 1

## 2021-06-11 MED ORDER — DEXAMETHASONE SODIUM PHOSPHATE 10 MG/ML IJ SOLN
INTRAMUSCULAR | Status: AC
  Filled 2021-06-11: qty 1

## 2021-06-11 MED ORDER — METHOCARBAMOL 1000 MG/10ML IJ SOLN
500.0000 mg | Freq: Four times a day (QID) | INTRAVENOUS | Status: DC | PRN
  Filled 2021-06-11: qty 5

## 2021-06-11 MED ORDER — METOCLOPRAMIDE HCL 5 MG PO TABS: 5.0000 mg | ORAL_TABLET | Freq: Three times a day (TID) | ORAL | Status: DC | PRN

## 2021-06-11 MED ORDER — OXYCODONE HCL 5 MG/5ML PO SOLN: 5.0000 mg | Freq: Once | ORAL | Status: DC | PRN

## 2021-06-11 MED ORDER — ACETAMINOPHEN 500 MG PO TABS
1000.0000 mg | ORAL_TABLET | Freq: Four times a day (QID) | ORAL | Status: AC
  Administered 2021-06-11 – 2021-06-12 (×4): 1000 mg via ORAL
  Filled 2021-06-11 (×4): qty 2

## 2021-06-11 MED ORDER — LIDOCAINE 2% (20 MG/ML) 5 ML SYRINGE
INTRAMUSCULAR | Status: DC | PRN
  Administered 2021-06-11: 40 mg via INTRAVENOUS

## 2021-06-11 MED ORDER — LIDOCAINE 2% (20 MG/ML) 5 ML SYRINGE
INTRAMUSCULAR | Status: AC
  Filled 2021-06-11: qty 5

## 2021-06-11 MED ORDER — ONDANSETRON HCL 4 MG/2ML IJ SOLN: 4.0000 mg | Freq: Four times a day (QID) | INTRAMUSCULAR | Status: DC | PRN

## 2021-06-11 MED ORDER — POVIDONE-IODINE 7.5 % EX SOLN
Freq: Once | CUTANEOUS | Status: AC
  Filled 2021-06-11: qty 118

## 2021-06-11 MED ORDER — INSULIN ASPART 100 UNIT/ML IJ SOLN: 0.0000 [IU] | INTRAMUSCULAR | Status: DC | PRN

## 2021-06-11 MED ORDER — ROCURONIUM BROMIDE 10 MG/ML (PF) SYRINGE
PREFILLED_SYRINGE | INTRAVENOUS | Status: DC | PRN
  Administered 2021-06-11: 60 mg via INTRAVENOUS

## 2021-06-11 MED ORDER — ONDANSETRON HCL 4 MG/2ML IJ SOLN
INTRAMUSCULAR | Status: DC | PRN
  Administered 2021-06-11: 4 mg via INTRAVENOUS

## 2021-06-11 MED ORDER — LACTATED RINGERS IV SOLN: INTRAVENOUS | Status: DC

## 2021-06-11 MED ORDER — AMISULPRIDE (ANTIEMETIC) 5 MG/2ML IV SOLN: 10.0000 mg | Freq: Once | INTRAVENOUS | Status: DC | PRN

## 2021-06-11 MED ORDER — METOCLOPRAMIDE HCL 5 MG/ML IJ SOLN
5.0000 mg | Freq: Three times a day (TID) | INTRAMUSCULAR | Status: DC | PRN
  Administered 2021-06-12: 10 mg via INTRAVENOUS
  Filled 2021-06-11: qty 2

## 2021-06-11 MED ORDER — TRANEXAMIC ACID-NACL 1000-0.7 MG/100ML-% IV SOLN
1000.0000 mg | INTRAVENOUS | Status: AC
  Administered 2021-06-11: 1000 mg via INTRAVENOUS
  Filled 2021-06-11: qty 100

## 2021-06-11 MED ORDER — MIDAZOLAM HCL 2 MG/2ML IJ SOLN
INTRAMUSCULAR | Status: DC | PRN
  Administered 2021-06-11 (×2): 1 mg via INTRAVENOUS

## 2021-06-11 MED ORDER — CHLORHEXIDINE GLUCONATE 0.12 % MT SOLN: 15.0000 mL | Freq: Once | OROMUCOSAL | Status: AC

## 2021-06-11 MED ORDER — PHENYLEPHRINE 40 MCG/ML (10ML) SYRINGE FOR IV PUSH (FOR BLOOD PRESSURE SUPPORT)
PREFILLED_SYRINGE | INTRAVENOUS | Status: DC | PRN
  Administered 2021-06-11 (×2): 120 ug via INTRAVENOUS

## 2021-06-11 MED ORDER — CEFAZOLIN SODIUM-DEXTROSE 2-4 GM/100ML-% IV SOLN
2.0000 g | Freq: Three times a day (TID) | INTRAVENOUS | Status: AC
  Administered 2021-06-11 – 2021-06-12 (×3): 2 g via INTRAVENOUS
  Filled 2021-06-11 (×3): qty 100

## 2021-06-11 MED ORDER — CHLORHEXIDINE GLUCONATE 0.12 % MT SOLN
OROMUCOSAL | Status: AC
  Administered 2021-06-11: 15 mL via OROMUCOSAL
  Filled 2021-06-11: qty 15

## 2021-06-11 MED ORDER — ROCURONIUM BROMIDE 10 MG/ML (PF) SYRINGE
PREFILLED_SYRINGE | INTRAVENOUS | Status: AC
  Filled 2021-06-11: qty 10

## 2021-06-11 MED ORDER — BUPIVACAINE LIPOSOME 1.3 % IJ SUSP
INTRAMUSCULAR | Status: DC | PRN
  Administered 2021-06-11: 10 mL via PERINEURAL

## 2021-06-11 MED ORDER — ACETAMINOPHEN 325 MG PO TABS: 325.0000 mg | ORAL_TABLET | Freq: Four times a day (QID) | ORAL | Status: DC | PRN

## 2021-06-11 MED ORDER — HYDROMORPHONE HCL 1 MG/ML IJ SOLN
INTRAMUSCULAR | Status: AC
  Filled 2021-06-11: qty 1

## 2021-06-11 MED ORDER — METHOCARBAMOL 500 MG PO TABS
500.0000 mg | ORAL_TABLET | Freq: Four times a day (QID) | ORAL | Status: DC | PRN
  Administered 2021-06-11 – 2021-06-12 (×3): 500 mg via ORAL
  Filled 2021-06-11 (×3): qty 1

## 2021-06-11 MED ORDER — ONDANSETRON HCL 4 MG/2ML IJ SOLN
INTRAMUSCULAR | Status: AC
  Filled 2021-06-11: qty 2

## 2021-06-11 MED ORDER — MIDAZOLAM HCL 2 MG/2ML IJ SOLN
INTRAMUSCULAR | Status: AC
  Filled 2021-06-11: qty 2

## 2021-06-11 MED ORDER — PROMETHAZINE HCL 25 MG/ML IJ SOLN: 6.2500 mg | INTRAMUSCULAR | Status: DC | PRN

## 2021-06-11 MED ORDER — PROPOFOL 10 MG/ML IV BOLUS
INTRAVENOUS | Status: AC
  Filled 2021-06-11: qty 20

## 2021-06-11 MED ORDER — FENTANYL CITRATE (PF) 250 MCG/5ML IJ SOLN
INTRAMUSCULAR | Status: DC | PRN
  Administered 2021-06-11 (×2): 50 ug via INTRAVENOUS

## 2021-06-11 MED ORDER — PHENOL 1.4 % MT LIQD: 1.0000 | OROMUCOSAL | Status: DC | PRN

## 2021-06-11 MED ORDER — CEFAZOLIN SODIUM-DEXTROSE 2-4 GM/100ML-% IV SOLN
2.0000 g | INTRAVENOUS | Status: AC
  Administered 2021-06-11: 2 g via INTRAVENOUS
  Filled 2021-06-11: qty 100

## 2021-06-11 MED ORDER — BUPIVACAINE HCL (PF) 0.5 % IJ SOLN
INTRAMUSCULAR | Status: DC | PRN
  Administered 2021-06-11: 20 mL via PERINEURAL

## 2021-06-11 MED ORDER — HYDROMORPHONE HCL 1 MG/ML IJ SOLN
0.2500 mg | INTRAMUSCULAR | Status: DC | PRN
  Administered 2021-06-11: 0.25 mg via INTRAVENOUS
  Administered 2021-06-11: 0.5 mg via INTRAVENOUS

## 2021-06-11 MED ORDER — SUGAMMADEX SODIUM 200 MG/2ML IV SOLN
INTRAVENOUS | Status: DC | PRN
  Administered 2021-06-11: 200 mg via INTRAVENOUS

## 2021-06-11 MED ORDER — VANCOMYCIN HCL 1000 MG IV SOLR
INTRAVENOUS | Status: AC
  Filled 2021-06-11: qty 20

## 2021-06-11 MED ORDER — VANCOMYCIN HCL 1000 MG IV SOLR
INTRAVENOUS | Status: DC | PRN
  Administered 2021-06-11: 1000 mg

## 2021-06-11 MED ORDER — OXYCODONE HCL 5 MG PO TABS: 5.0000 mg | ORAL_TABLET | Freq: Once | ORAL | Status: DC | PRN

## 2021-06-11 MED ORDER — ONDANSETRON HCL 4 MG PO TABS
4.0000 mg | ORAL_TABLET | Freq: Four times a day (QID) | ORAL | Status: DC | PRN
Start: 2021-06-11 — End: 2021-06-12

## 2021-06-11 MED ORDER — 0.9 % SODIUM CHLORIDE (POUR BTL) OPTIME
TOPICAL | Status: DC | PRN
  Administered 2021-06-11 (×5): 1000 mL

## 2021-06-11 MED ORDER — MEPERIDINE HCL 25 MG/ML IJ SOLN: 6.2500 mg | INTRAMUSCULAR | Status: DC | PRN

## 2021-06-11 SURGICAL SUPPLY — 77 items
ADPR HD STD TPR HUM TI RVRS (Orthopedic Implant) ×1 IMPLANT
AID PSTN UNV HD RSTRNT DISP (MISCELLANEOUS) ×1
ALCOHOL 70% 16 OZ (MISCELLANEOUS) ×2 IMPLANT
ANCH SUT 2 JK 1.5X2.9 2 LD (Anchor) ×1 IMPLANT
ANCHOR SUT JK SZ 2 2.9 DBL SL (Anchor) ×1 IMPLANT
APL PRP STRL LF DISP 70% ISPRP (MISCELLANEOUS) ×1
BAG COUNTER SPONGE SURGICOUNT (BAG) ×2 IMPLANT
BAG SPNG CNTER NS LX DISP (BAG) ×1
BEARING CROSSLINK RSA 36 (Joint) ×1 IMPLANT
BLADE SAW SGTL 13X75X1.27 (BLADE) ×2 IMPLANT
BRNG HUM STD 36 RVRS SHDR PRLG (Joint) ×1 IMPLANT
CHLORAPREP W/TINT 26 (MISCELLANEOUS) ×2 IMPLANT
COOLER ICEMAN CLASSIC (MISCELLANEOUS) ×2 IMPLANT
COVER SURGICAL LIGHT HANDLE (MISCELLANEOUS) ×2 IMPLANT
DRAPE INCISE IOBAN 66X45 STRL (DRAPES) ×2 IMPLANT
DRAPE U-SHAPE 47X51 STRL (DRAPES) ×4 IMPLANT
DRSG AQUACEL AG ADV 3.5X10 (GAUZE/BANDAGES/DRESSINGS) ×2 IMPLANT
ELECT BLADE 4.0 EZ CLEAN MEGAD (MISCELLANEOUS) ×2
ELECT REM PT RETURN 9FT ADLT (ELECTROSURGICAL) ×2
ELECTRODE BLDE 4.0 EZ CLN MEGD (MISCELLANEOUS) ×1 IMPLANT
ELECTRODE REM PT RTRN 9FT ADLT (ELECTROSURGICAL) ×1 IMPLANT
GAUZE SPONGE 4X4 12PLY STRL LF (GAUZE/BANDAGES/DRESSINGS) ×2 IMPLANT
GLENOID SPHERE 36+6 (Joint) ×1 IMPLANT
GLOVE SRG 8 PF TXTR STRL LF DI (GLOVE) ×1 IMPLANT
GLOVE SURG LTX SZ7 (GLOVE) ×2 IMPLANT
GLOVE SURG LTX SZ8 (GLOVE) ×2 IMPLANT
GLOVE SURG UNDER POLY LF SZ7 (GLOVE) ×2 IMPLANT
GLOVE SURG UNDER POLY LF SZ8 (GLOVE) ×2
GOWN STRL REUS W/ TWL LRG LVL3 (GOWN DISPOSABLE) ×1 IMPLANT
GOWN STRL REUS W/ TWL XL LVL3 (GOWN DISPOSABLE) ×1 IMPLANT
GOWN STRL REUS W/TWL LRG LVL3 (GOWN DISPOSABLE) ×2
GOWN STRL REUS W/TWL XL LVL3 (GOWN DISPOSABLE) ×2
HEAD HUMERAL COMP STD (Orthopedic Implant) IMPLANT
HUMERAL HEAD COMP STD (Orthopedic Implant) ×2 IMPLANT
HYDROGEN PEROXIDE 16OZ (MISCELLANEOUS) ×2 IMPLANT
JET LAVAGE IRRISEPT WOUND (IRRIGATION / IRRIGATOR) ×2
KIT BASIN OR (CUSTOM PROCEDURE TRAY) ×2 IMPLANT
KIT TURNOVER KIT B (KITS) ×2 IMPLANT
LAVAGE JET IRRISEPT WOUND (IRRIGATION / IRRIGATOR) ×1 IMPLANT
LOOP VESSEL MAXI BLUE (MISCELLANEOUS) ×2 IMPLANT
MANIFOLD NEPTUNE II (INSTRUMENTS) ×2 IMPLANT
NDL 1/2 CIR MAYO (NEEDLE) IMPLANT
NDL SUT 6 .5 CRC .975X.05 MAYO (NEEDLE) IMPLANT
NDL TAPERED W/ NITINOL LOOP (MISCELLANEOUS) ×1 IMPLANT
NEEDLE 1/2 CIR MAYO (NEEDLE) ×2 IMPLANT
NEEDLE MAYO TAPER (NEEDLE)
NEEDLE TAPERED W/ NITINOL LOOP (MISCELLANEOUS) ×2 IMPLANT
NS IRRIG 1000ML POUR BTL (IV SOLUTION) ×2 IMPLANT
PACK SHOULDER (CUSTOM PROCEDURE TRAY) ×2 IMPLANT
PAD ARMBOARD 7.5X6 YLW CONV (MISCELLANEOUS) ×4 IMPLANT
PAD COLD SHLDR WRAP-ON (PAD) ×3 IMPLANT
PASSER SUT SWANSON 36MM LOOP (INSTRUMENTS) ×2 IMPLANT
RESTRAINT HEAD UNIVERSAL NS (MISCELLANEOUS) ×2 IMPLANT
SLING ARM IMMOBILIZER LRG (SOFTGOODS) ×1 IMPLANT
SLING ARM IMMOBILIZER MED (SOFTGOODS) ×1 IMPLANT
SOL PREP POV-IOD 4OZ 10% (MISCELLANEOUS) ×2 IMPLANT
SPONGE T-LAP 18X18 ~~LOC~~+RFID (SPONGE) ×2 IMPLANT
STRIP CLOSURE SKIN 1/2X4 (GAUZE/BANDAGES/DRESSINGS) ×2 IMPLANT
SUCTION FRAZIER HANDLE 10FR (MISCELLANEOUS) ×2
SUCTION TUBE FRAZIER 10FR DISP (MISCELLANEOUS) ×1 IMPLANT
SUT FIBERWIRE #2 38 T-5 BLUE (SUTURE)
SUT MAXBRAID (SUTURE) IMPLANT
SUT MNCRL AB 3-0 PS2 18 (SUTURE) ×2 IMPLANT
SUT SILK 2 0 TIES 10X30 (SUTURE) ×2 IMPLANT
SUT VIC AB 0 CT1 27 (SUTURE) ×8
SUT VIC AB 0 CT1 27XBRD ANBCTR (SUTURE) ×4 IMPLANT
SUT VIC AB 1 CT1 27 (SUTURE) ×4
SUT VIC AB 1 CT1 27XBRD ANBCTR (SUTURE) ×2 IMPLANT
SUT VIC AB 2-0 CT1 27 (SUTURE) ×6
SUT VIC AB 2-0 CT1 TAPERPNT 27 (SUTURE) ×3 IMPLANT
SUT VICRYL 0 UR6 27IN ABS (SUTURE) ×4 IMPLANT
SUTURE FIBERWR #2 38 T-5 BLUE (SUTURE) IMPLANT
TOWEL GREEN STERILE (TOWEL DISPOSABLE) ×2 IMPLANT
TRAY FOL W/BAG SLVR 16FR STRL (SET/KITS/TRAYS/PACK) IMPLANT
TRAY FOLEY W/BAG SLVR 16FR LF (SET/KITS/TRAYS/PACK)
TRAY HUM REV SHOULDER STD +6 (Shoulder) ×1 IMPLANT
WATER STERILE IRR 1000ML POUR (IV SOLUTION) ×2 IMPLANT

## 2021-06-11 NOTE — H&P (Signed)
[name] is an 66 y.o. female.   ?Chief Complaint: Right shoulder instability ?HPI: [name] is a 66 year old patient who is about 2 years out from right total shoulder replacement.  She started developing instability symptoms about a year out from surgery.  On examination she has what is likely subscapularis partial versus complete detachment presents now for operative management after explanation of risks and benefits. ? ?Past Medical History:  ?Diagnosis Date  ? Allergy   ? Anemia   ? Anginal pain ([place])   ? last time   ? Anxiety   ? 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 Asthma   ? Bipolar 1 disorder ([place])   ? Blood transfusion without reported diagnosis   ? Cataracts, bilateral 07/2017  ? CHF (congestive heart failure) ([name])   ? COPD (chronic obstructive pulmonary disease) ([name])   ? Coronary artery disease   ? reported hx of "MI";  Echo 2009 with normal LVF;  Myoview 05/2011: no ischemia  ? Depression   ? Diabetes mellitus without complication ([name])   ? Dyslipidemia   ? Dysrhythmia   ? SVT  ? Esophageal stricture   ? Fibromyalgia   ? GERD (gastroesophageal reflux disease)   ? H/O hiatal hernia   ? Head injury, unspecified   ? Headache   ? migraines  ? Herpes simplex infection   ? History of kidney stones   ? History of loop recorder   ? Managed by Dr. Crissie Sickles  ? Hyperlipidemia   ? Hypertension   ? Insomnia   ? Myocardial infarction [facility])   ? age 55  ? Osteoporosis   ? Pneumonia   ? hx  ? Seizures ([name])   ? Shortness of breath   ? Sleep apnea   ? ? neg  ? Spinal stenosis of lumbar region   ? Spondylolisthesis   ? Status post placement of implantable loop recorder   ? Supraventricular tachycardia ([name])   ? Syncope and collapse   ? s/p ILR; no arhythmogenic cause identified  ? UTI (lower urinary tract infection)   ? ? ?Past Surgical History:  ?Procedure Laterality Date  ? BACK SURGERY    ? BREAST SURGERY    ? lumpectomy  ? CARDIAC CATHETERIZATION  10/06/2011  ? CATARACT EXTRACTION W/PHACO Right 07/31/2017  ?  Procedure: CATARACT EXTRACTION PHACO AND INTRAOCULAR LENS PLACEMENT (IOC);  Surgeon: [name], MD;  Location: [facility];  Service: Ophthalmology;  Laterality: Right;  CDE: 2.33  ? CATARACT EXTRACTION W/PHACO Left 08/14/2017  ? Procedure: CATARACT EXTRACTION PHACO AND INTRAOCULAR LENS PLACEMENT (IOC);  Surgeon: [name], MD;  Location: [facility];  Service: Ophthalmology;  Laterality: Left;  CDE: 2.74  ? CHOLECYSTECTOMY    ? CYSTOSCOPY    ? stone  ? DIAGNOSTIC LAPAROSCOPY    ? laparoscopic cholecystectomy  ? DOPPLER ECHOCARDIOGRAPHY  2009  ? ESOPHAGOGASTRODUODENOSCOPY N/A 10/31/2020  ? Procedure: ESOPHAGOGASTRODUODENOSCOPY (EGD);  Surgeon: Erica Pickerel, MD;  Location: [facility];  Service: General;  Laterality: N/A;  ? EYE SURGERY Bilateral 08/14/2017  ? cataract removal  ? head up tilt table testing  06/15/2007  ? [name]  ? HEMORRHOID SURGERY    ? HERNIA REPAIR    ? insertion of implatable loop recorder  08/11/2007  ? [name]  ? POSTERIOR CERVICAL FUSION/FORAMINOTOMY N/A 12/19/2013  ? Procedure: RIGHT C3-4.C4-5 AND C5-6 FORAMINOTOMIES;  Surgeon: Erica Oto, MD;  Location: [place];  Service: Orthopedics;  Laterality: N/A;  ? TOTAL HIP ARTHROPLASTY Left  01/31/2020  ? Procedure: LEFT TOTAL HIP ARTHROPLASTY ANTERIOR APPROACH;  Surgeon: Erica Rossetti, MD;  Location: [place];  Service: Orthopedics;  Laterality: Left;  ? TOTAL SHOULDER ARTHROPLASTY Right 11/15/2019  ? Procedure: RIGHT TOTAL SHOULDER ARTHROPLASTY;  Surgeon: Erica Pel, MD;  Location: [facility];  Service: Orthopedics;  Laterality: Right;  ? TUBAL LIGATION    ? UPPER GASTROINTESTINAL ENDOSCOPY    ? XI ROBOTIC ASSISTED HIATAL HERNIA REPAIR N/A 10/31/2020  ? Procedure: XI ROBOTIC ASSISTED HIATAL HERNIA REPAIR WITH PARTIAL FUNDOPLICATION;  Surgeon: Erica Pickerel, MD;  Location: [facility];  Service: General;  Laterality: N/A;  ? ? ?Family History  ?Problem Relation Age of Onset  ? Heart attack Father   ? Mental illness Father   ? Mental  illness Mother   ? Heart attack Brother   ?     stents  ? Alcohol abuse Brother   ? Heart disease Brother   ? Drug abuse Brother   ? Diabetes Brother   ? Colon cancer Maternal Aunt   ? Cirrhosis Brother   ? Stomach cancer Neg Hx   ? Esophageal cancer Neg Hx   ? Rectal cancer Neg Hx   ? ?Social History:  reports that she has never smoked. She has never used smokeless tobacco. She reports that she does not drink alcohol and does not use drugs. ? ?Allergies:  ?Allergies  ?Allergen Reactions  ? Codeine Other (See Comments)  ?  "I will have a heart attack."  ? Morphine And Related Other (See Comments)  ?  "It will cause me to have a heart attack."  ? Ambien [Zolpidem Tartrate] Nausea And Vomiting  ? Clonidine Derivatives Nausea And Vomiting  ?  gerd - caused acid reflux per pt  ? Metformin And Related Nausea And Vomiting  ?  cramping from metformin  ? Lyrica [Pregabalin] Swelling and Other (See Comments)  ?  Weight gain  ? Neurontin [Gabapentin] Other (See Comments)  ?  Causes elevated LFTs ?  ? ? ?Medications Prior to Admission  ?Medication Sig Dispense Refill  ? acyclovir ointment (ZOVIRAX) 5 % APPLY TOPICALLY EVERY 3 HOURS. (Patient taking differently: Apply 1 application. topically daily as needed (outbreaks).) 30 g 0  ? albuterol (VENTOLIN HFA) 108 (90 Base) MCG/ACT inhaler INHALE 2 PUFFS EVERY 6 HOURS AS NEEDED FOR SHORTNESS OF BREATH AND WHEEZING. 6.7 g 5  ? busPIRone (BUSPAR) 10 MG tablet Take 1 tablet (10 mg total) by mouth 2 (two) times daily as needed. (Patient taking differently: Take 10 mg by mouth 2 (two) times daily.) 60 tablet 5  ? butalbital-acetaminophen-caffeine (FIORICET) 50-325-40 MG tablet TAKE 1 TABLET BY MOUTH EVERY 6 HOURS AS NEEDED FOR HEADACHE. 20 tablet 0  ? carvedilol (COREG) 3.125 MG tablet Take 0.5 tablets (1.5625 mg total) by mouth 2 (two) times daily with a meal. BP over 150 before taken (Patient taking differently: Take 3.125 mg by mouth daily as needed (blood pressure over 150). BP over  150 before taken) 180 tablet 1  ? celecoxib (CELEBREX) 200 MG capsule TAKE 1 CAPSULE BY MOUTH TWICE DAILY BETWEEN MEALS AS NEEDED. 60 capsule 0  ? dexlansoprazole (DEXILANT) 60 MG capsule Take 1 capsule (60 mg total) by mouth daily. 90 capsule 1  ? diclofenac Sodium (VOLTAREN) 1 % GEL APPLY 4 GRAMS TOPICALLY 4 TIMES A DAY. (Patient taking differently: Apply 4 g topically 4 (four) times daily as needed (pain).) 100 g 0  ? DULoxetine (CYMBALTA) 60 MG capsule Take 1  capsule (60 mg total) by mouth at bedtime. 90 capsule 1  ? empagliflozin (JARDIANCE) 25 MG TABS tablet Take 1 tablet (25 mg total) by mouth daily before breakfast. 90 tablet 3  ? escitalopram (LEXAPRO) 20 MG tablet Take 1 tablet (20 mg total) by mouth daily. 90 tablet 1  ? FIBER PO Take 1 tablet by mouth in the morning and at bedtime.    ? fludrocortisone (FLORINEF) 0.1 MG tablet TAKE 1 TABLET EVERY OTHER DAY. 45 tablet 1  ? fluticasone (FLONASE) 50 MCG/ACT nasal spray USE 1 SPRAY IN EACH NOSTRIL ONCE DAILY. (Patient taking differently: Place 1 spray into both nostrils daily as needed for allergies.) 16 g 5  ? furosemide (LASIX) 40 MG tablet Take 1 tablet (40 mg total) by mouth daily as needed for fluid. 90 tablet 1  ? hydrALAZINE (APRESOLINE) 25 MG tablet TAKE 1 TABLET BY MOUTH 3 TIMES DAILY AS NEEDED (FOR SEVERE HYPERTENSION/SYSTOLIC NUMBER [id number] OR GREATER). (Patient taking differently: Take 25 mg by mouth 2 (two) times daily as needed (blood pressure over 185).) 90 tablet 3  ? HYDROcodone-acetaminophen (NORCO) 7.5-325 MG tablet Take 1 tablet by mouth every 6 (six) hours as needed for moderate pain. (Patient taking differently: Take 1 tablet by mouth in the morning, at noon, and at bedtime.) 90 tablet 0  ? Ibuprofen-diphenhydrAMINE HCl (ADVIL PM) 200-25 MG CAPS Take 2 capsules by mouth at bedtime.    ? isosorbide mononitrate (IMDUR) 30 MG 24 hr tablet Take 1.5 tablets (45 mg total) by mouth daily. 45 tablet 6  ? lamoTRIgine (LAMICTAL) 150 MG tablet Take 1  tablet (150 mg total) by mouth daily. 90 tablet 1  ? levETIRAcetam (KEPPRA) 500 MG tablet TAKE (1) TABLET TWICE DAILY. 60 tablet 2  ? levocetirizine (XYZAL) 5 MG tablet TAKE 1 TABLET BY MOUTH EVERY MORNING.

## 2021-06-11 NOTE — Transfer of Care (Signed)
Immediate Anesthesia Transfer of Care Note ? ?Patient: [name] ? ?Procedure(s) Performed: RIGHT SHOULDER CONVERSION TOTAL SHOULDER ARTHROPLASTY to REVERSE TOTAL SHOULDER ARTHROPLASTY (Right: Shoulder) ? ?Patient Location: PACU ? ?Anesthesia Type:General ? ?Level of Consciousness: awake and alert  ? ?Airway & Oxygen Therapy: Patient Spontanous Breathing and Patient connected to nasal cannula oxygen ? ?Post-op Assessment: Report given to RN ? ?Post vital signs: Reviewed and stable ? ?Last Vitals:  ?Vitals Value Taken Time  ?BP 156/76 06/11/21 1000  ?Temp 36.6 ?C 06/11/21 1000  ?Pulse 71 06/11/21 1006  ?Resp 14 06/11/21 1006  ?SpO2 97 % 06/11/21 1006  ?Vitals shown include unvalidated device data. ? ?Last Pain:  ?Vitals:  ? 06/11/21 1000  ?TempSrc:   ?PainSc: Asleep  ?   ? ?Patients Stated Pain Goal: 2 (06/11/21 1657) ? ?Complications: No notable events documented. ?

## 2021-06-11 NOTE — Op Note (Signed)
NAME: [name], Erica B. ?MEDICAL RECORD NO: [id number] ?ACCOUNT NO: [id number] ?DATE OF BIRTH: [birth year] ?FACILITY: [facility] ?LOCATION: [facility] ?PHYSICIAN: Graylin ShiverGregory S. August Saucerean, MD ? ?Operative Report  ? ?DATE OF PROCEDURE: 06/11/2021 ? 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 ? 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 ? ?PROCEDURE:  Right total shoulder conversion to reverse shoulder replacement using Biomet implants including glenosphere 36+6 offset with mini humeral tray +6 taper offset, 40 mm diameter and 36 standard bearing. ? ?SURGEON:  Graylin ShiverGregory S. August Saucerean, MD ? ?ASSISTANT:  Karenann CaiLuke Magnant. ? ?INDICATIONS:  The patient is a 66 year old patient who underwent total shoulder replacement about 2 years ago.  Recently, she started developing instability in the shoulder and was noted to have increased external rotation and subscap weakness on exam.  ?Due to this problem she would like to have this converted to reverse shoulder replacement, which is feasible based on implant placed at the time of her original construct. ? ?DESCRIPTION OF PROCEDURE:  The patient was brought to the operating room where general anesthetic was induced.  Preoperative antibiotics administered.  Timeout was called.  The patient was placed in the beach chair position with the head in neutral  ?position.  Right shoulder, arm and hand prescrubbed with hydrogen peroxide followed by alcohol and Betadine and then prepped with ChloraPrep solution and draped in a sterile manner.  Ioban used to cover the operative field.  Timeout was called.   ?Deltopectoral approach was made.  Cephalic vein required sacrifice due to adherence and scarring.  The deltoid was then elevated off the proximal humerus and the subacromial space was also developed with a Cobb elevator. The patient had about one-third  ?of the inferior aspect of the subscap still intact but the superior  two-thirds was not intact. At this time Kolbel retractor was placed.  No infection present with no significant effusion or reddened tissue.  Next, soft tissue was removed from the  ?humerus and humeral neck with progressive external rotation around to the 5 o'clock position.  Head was dislocated.  The stem was well fixed, the humeral head was removed with a tuning fork elevator.  Attention was directed towards the glenoid.  The  ?glenoid baseplate was very secured. The glenoid polyethylene spacer was removed.  At this time, trial reduction was performed with the +3 and +6 standard glenosphere.  We also used a +6 offset standard and +3 polyethylene spacer.  The most stable  ?construct was a +6 on the glenoid side and the +6 offset standard polyethylene thickness on the humeral side.  This gave very good stability, was difficult to both relocate and dislocate.  With this shoulder in position she had excellent stability to  ?extension, and adduction as well as internal and external rotation as well as forward flexion.  It was 2 fingers tight.  Undue tension not noted in the conjoined tendon.  With exposure of the glenoid great care was taken to avoid injury to surrounding  ?neurovascular structures by remaining discretely on the bone with tissue dissection.  Next, trial components were removed.  Thorough irrigation was performed.  IrriSept solution used after the incision as well as at multiple times during the case, true  ?components placed with same stability, parameters maintained.  The soft tissue sleeve was then reattached with the arm in 30 degrees of external rotation at the inferior aspect of the humeral neck using a JuggerKnot suture anchor.  The deltopectoral  ?interval was  then closed using #1 Vicryl suture followed by interrupted inverted 0 Vicryl suture, 2-0 Vicryl suture, and 3-0 Monocryl with Steri-Strips applied along with an Aquacel dressing.  The patient tolerated the procedure well without immediate   ?complication.  Luke's assistance was required at all times for retraction, opening, closing, mobilization of tissue.  His assistance was a medical necessity. ? ? ?SHY ?D: 06/11/2021 10:16:35 am T: 06/11/2021 12:03:00 pm  ?JOB: [id number]/ [id number]  ?

## 2021-06-11 NOTE — Anesthesia Postprocedure Evaluation (Signed)
Anesthesia Post Note ? ?Patient: [name] ? ?Procedure(s) Performed: RIGHT SHOULDER CONVERSION TOTAL SHOULDER ARTHROPLASTY to REVERSE TOTAL SHOULDER ARTHROPLASTY (Right: Shoulder) ? ?  ? ?Patient location during evaluation: PACU ?Anesthesia Type: General ?Level of consciousness: awake and alert ?Pain management: pain level controlled ?Vital Signs Assessment: post-procedure vital signs reviewed and stable ?Respiratory status: spontaneous breathing, nonlabored ventilation and respiratory function stable ?Cardiovascular status: blood pressure returned to baseline and stable ?Postop Assessment: no apparent nausea or vomiting ?Anesthetic complications: no ? ? ?No notable events documented. ? ?Last Vitals:  ?Vitals:  ? 06/11/21 1115 06/11/21 1130  ?BP: 133/72 (!) 134/94  ?Pulse: 76 84  ?Resp: (!) 25   ?Temp:    ?SpO2: 93% 91%  ?  ?Last Pain:  ?Vitals:  ? 06/11/21 1100  ?TempSrc:   ?PainSc: 3   ? ? ?  ?  ?  ?  ?  ?  ? ?Lowella Curb ? ? ? ? ?

## 2021-06-11 NOTE — Brief Op Note (Signed)
? ?  06/11/2021 ? ?10:10 AM ? ?PATIENT:  [name]  66 y.o. female ? ?PRE-OPERATIVE DIAGNOSIS:  right shoulder subscapularis tear ? ?POST-OPERATIVE DIAGNOSIS:  right shoulder subscapularis tear ? ?PROCEDURE:  Procedure(s): ?RIGHT SHOULDER CONVERSION TOTAL SHOULDER ARTHROPLASTY to REVERSE TOTAL SHOULDER ARTHROPLASTY ? ?SURGEON:  Surgeon(s): ?Cammy Copa, MD ? ?ASSISTANT: Karenann Cai, PA ? ?ANESTHESIA:   general ? ?EBL: 50 ml   ? ?Total I/O ?In: 800 [I.V.:800] ?Out: 100 [Blood:100] ? ?BLOOD ADMINISTERED: none ? ?DRAINS: none  ? ?LOCAL MEDICATIONS USED:  none ? ?SPECIMEN:  No Specimen ? ?COUNTS:  YES ? ?TOURNIQUET:  * No tourniquets in log * ? ?DICTATION: .Other Dictation: Dictation Number [id number] ? ?PLAN OF CARE: Admit for overnight observation ? ?PATIENT DISPOSITION:  PACU - hemodynamically stable ? ? ? ? ? ? ? ? ? ? ? ? ?  ?

## 2021-06-11 NOTE — Anesthesia Procedure Notes (Signed)
Anesthesia Regional Block: Interscalene brachial plexus block  ? ?Pre-Anesthetic Checklist: , timeout performed,  Correct Patient, Correct Site, Correct Laterality,  Correct Procedure, Correct Position, site marked,  Risks and benefits discussed,  Surgical consent,  Pre-op evaluation,  At surgeon's request and post-op pain management ? ?Laterality: Right ? ?Prep: chloraprep     ?  ?Needles:  ?Injection technique: Single-shot ? ?Needle Type: Stimiplex   ? ? ?Needle Length: 9cm  ?Needle Gauge: 21  ? ? ? ?Additional Needles: ? ? ?Procedures:,,,, ultrasound used (permanent image in chart),,    ?Narrative:  ?Start time: 06/11/2021 7:06 AM ?End time: 06/11/2021 7:11 AM ?Injection made incrementally with aspirations every 5 mL. ? ?Performed by: Personally  ?Anesthesiologist: Lowella Curb, MD ? ? ? ? ?

## 2021-06-11 NOTE — Anesthesia Procedure Notes (Signed)
Procedure Name: Intubation ?Date/Time: 06/11/2021 7:35 AM ?Performed by: Terrence Dupont, CRNA ?Pre-anesthesia Checklist: Patient identified, Emergency Drugs available, Suction available and Patient being monitored ?Patient Re-evaluated:Patient Re-evaluated prior to induction ?Oxygen Delivery Method: Circle system utilized ?Preoxygenation: Pre-oxygenation with 100% oxygen ?Induction Type: IV induction ?Ventilation: Mask ventilation without difficulty ?Laryngoscope Size: Mac and 3 ?Grade View: Grade II ?Tube type: Oral ?Tube size: 7.0 mm ?Number of attempts: 1 ?Airway Equipment and Method: Stylet and Oral airway ?Placement Confirmation: ETT inserted through vocal cords under direct vision, positive ETCO2 and breath sounds checked- equal and bilateral ?Tube secured with: Tape ?Dental Injury: Teeth and Oropharynx as per pre-operative assessment  ? ? ? ? ?

## 2021-06-11 NOTE — Evaluation (Signed)
Occupational Therapy Evaluation ?Patient Details ?Name: [name] ?MRN: [id number] ?DOB: 06-23-1955 ?Today's Date: 06/11/2021 ? ? ?History of Present Illness 66 yo female s/p Reverse tSA PMH anxiety arthritis bipolar CHF COPD CAD depression DM fribromyalgia head injury MI seizure back surg, L THA  ? ?Clinical Impression ?  ?Patient is s/p R reverse TSA surgery resulting in functional limitations due to the deficits listed below (see OT problem list). Pt currently with nerve block in place and started education session. Pt reports discomfort in her back at baseline and will possibly need to dangle eob for pendulum training.  Patient will benefit from skilled OT acutely to increase independence and safety with ADLS to allow discharge outpatient follow up. ?  ?   ? ?Recommendations for follow up therapy are one component of a multi-disciplinary discharge planning process, led by the attending physician.  Recommendations may be updated based on patient status, additional functional criteria and insurance authorization.  ? ?Follow Up Recommendations ? Outpatient OT  ?  ?Assistance Recommended at Discharge    ?Patient can return home with the following A little help with walking and/or transfers;A little help with bathing/dressing/bathroom;Assistance with cooking/housework;Assistance with feeding ? ?  ?Functional Status Assessment ? Patient has had a recent decline in their functional status and demonstrates the ability to make significant improvements in function in a reasonable and predictable amount of time.  ?Equipment Recommendations ?    ?  ?Recommendations for Other Services   ? ? ?  ?Precautions / Restrictions Precautions ?Precautions: Shoulder ?Type of Shoulder Precautions: reverse ?Shoulder Interventions: Shoulder sling/immobilizer;Off for dressing/bathing/exercises ?Precaution Comments: shoulder protocol ?Required Braces or Orthoses: Sling ?Restrictions ?Weight Bearing Restrictions: Yes ?RUE Weight  Bearing: Non weight bearing  ? ?  ? ?Mobility Bed Mobility ?Overal bed mobility: Needs Assistance ?Bed Mobility: Supine to Sit ?  ?  ?Supine to sit: Min guard ?  ?  ?  ?  ? ?Transfers ?Overall transfer level: Needs assistance ?  ?Transfers: Sit to/from Stand ?Sit to Stand: Min assist ?  ?  ?  ?  ?  ?General transfer comment: requires hand held (A) ?  ? ?  ?Balance Overall balance assessment: Mild deficits observed, not formally tested ?  ?  ?  ?  ?  ?  ?  ?  ?  ?  ?  ?  ?  ?  ?  ?  ?  ?  ?   ? ?ADL either performed or assessed with clinical judgement  ? ?ADL Overall ADL's : Needs assistance/impaired ?Eating/Feeding: Set up;Bed level ?  ?Grooming: Wash/dry hands;Min guard;Standing ?  ?Upper Body Bathing: Minimal assistance;Bed level ?  ?  ?  ?Upper Body Dressing : Minimal assistance;Sitting ?Upper Body Dressing Details (indicate cue type and reason): requires (A) adjusting sling ?  ?  ?Toilet Transfer: Solicitor ?  ?Toileting- Architect and Hygiene: Min guard;Sit to/from stand ?  ?  ?  ?Functional mobility during ADLs: Supervision/safety ?   ?Shoulder precautions : Educated patient on don doff brace with return demonstration,washing under arms with cloth, never to wash directly on incision site, avoid shoulder movement. positioning with pillows in chair for ,  ? ? ?Vision Baseline Vision/History: 1 Wears glasses ?Ability to See in Adequate Light: 0 Adequate ?   ?   ?Perception   ?  ?Praxis   ?  ? ?Pertinent Vitals/Pain Pain Assessment ?Pain Assessment: No/denies pain (nerve block still in place)  ? ? ? ?Hand Dominance Right ?  ?  Extremity/Trunk Assessment Upper Extremity Assessment ?Upper Extremity Assessment: RUE deficits/detail ?RUE Deficits / Details: nerve block in place. starting to move all digits. pt with decreased wrist extension. numbness from wrist upward. pt able to feel hand ?RUE Sensation: decreased light touch;decreased proprioception ?RUE Coordination: decreased fine  motor;decreased gross motor ?  ?Lower Extremity Assessment ?Lower Extremity Assessment: Overall WFL for tasks assessed ?  ?Cervical / Trunk Assessment ?Cervical / Trunk Assessment: Back Surgery ?  ?Communication Communication ?Communication: No difficulties ?  ?Cognition Arousal/Alertness: Awake/alert ?Behavior During Therapy: [facility] for tasks assessed/performed ?Overall Cognitive Status: Within Functional Limits for tasks assessed ?  ?  ?  ?  ?  ?  ?  ?  ?  ?  ?  ?  ?  ?  ?  ?  ?  ?  ?  ?General Comments  dressing dry and intact ? ?  ?Exercises Exercises: Other exercises ?Other Exercises ?Other Exercises: AROM hand flex and ext digits with decreased extension currently ?Other Exercises: pt with some back discomfort with hip flexion simulating positioning for pendulum exercises ?  ?Shoulder Instructions    ? ? ?Home Living Family/patient expects to be discharged to:: Private residence ?Living Arrangements: Children ?Available Help at Discharge: Family ?Type of Home: House ?Home Access: Stairs to enter ?Entrance Stairs-Number of Steps: 15 ?Entrance Stairs-Rails: None ?Home Layout: Multi-level ?  ?Alternate Level Stairs-Rails: Right ?Bathroom Shower/Tub: Tub/shower unit ?  ?Bathroom Toilet: Handicapped height ?  ?  ?Home Equipment: Agricultural consultant (2 wheels);Shower seat ?  ?Additional Comments: daughter and granddaughter live in the home ?  ? ?  ?Prior Functioning/Environment Prior Level of Function : Independent/Modified Independent ?  ?  ?  ?  ?  ?  ?  ?  ?  ? ?  ?  ?OT Problem List: Decreased activity tolerance;Impaired UE functional use;Decreased safety awareness;Decreased knowledge of use of DME or AE;Decreased knowledge of precautions ?  ?   ?OT Treatment/Interventions: Self-care/ADL training;Therapeutic exercise;DME and/or AE instruction;Splinting;Therapeutic activities;Balance training;Patient/family education  ?  ?OT Goals(Current goals can be found in the care plan section) Acute Rehab OT Goals ?Patient Stated  Goal: to return home tomorrow ?OT Goal Formulation: With patient/family ?Time For Goal Achievement: 06/25/21 ?Potential to Achieve Goals: Good  ?OT Frequency: Min 2X/week ?  ? ?Co-evaluation   ?  ?  ?  ?  ? ?  ?AM-PAC OT "6 Clicks" Daily Activity     ?Outcome Measure Help from another person eating meals?: A Little ?Help from another person taking care of personal grooming?: A Little ?Help from another person toileting, which includes using toliet, bedpan, or urinal?: A Little ?Help from another person bathing (including washing, rinsing, drying)?: A Little ?Help from another person to put on and taking off regular upper body clothing?: A Little ?Help from another person to put on and taking off regular lower body clothing?: A Little ?6 Click Score: 18 ?  ?End of Session Nurse Communication: Mobility status;Precautions;Weight bearing status ? ?Activity Tolerance: Patient tolerated treatment well ?Patient left: in bed;with call bell/phone within reach;with family/visitor present ? ?OT Visit Diagnosis: Unsteadiness on feet (R26.81);Muscle weakness (generalized) (M62.81)  ?              ?Time: 1610-9604 ?OT Time Calculation (min): 17 min ?Charges:  OT General Charges ?$OT Visit: 1 Visit ?OT Evaluation ?$OT Eval Moderate Complexity: 1 Mod ? ? ?Brynn, OTR/L  ?Acute Rehabilitation Services ?Pager: [phone] ?Office: [phone] ?. ? ? ?Mateo Flow ?06/11/2021, 3:08 PM ?

## 2021-06-12 ENCOUNTER — Encounter: Payer: Self-pay | Admitting: Orthopedic Surgery

## 2021-06-12 LAB — GLUCOSE, CAPILLARY: Glucose-Capillary: 128 mg/dL — ABNORMAL HIGH (ref 70–99)

## 2021-06-12 LAB — BASIC METABOLIC PANEL
Anion gap: 8 (ref 5–15)
BUN: 9 mg/dL (ref 8–23)
CO2: 26 mmol/L (ref 22–32)
Calcium: 8.9 mg/dL (ref 8.9–10.3)
Chloride: 102 mmol/L (ref 98–111)
Creatinine, Ser: 0.76 mg/dL (ref 0.44–1.00)
GFR, Estimated: >60 mL/min (ref >60)
Glucose, Bld: 121 mg/dL — ABNORMAL HIGH (ref 70–99)
Potassium: 3.5 mmol/L (ref 3.5–5.1)
Sodium: 136 mmol/L (ref 135–145)

## 2021-06-12 LAB — CBC
HCT: 41.6 % (ref 36.0–46.0)
Hemoglobin: 14 g/dL (ref 12.0–15.0)
MCH: 32.6 pg (ref 26.0–34.0)
MCHC: 33.7 g/dL (ref 30.0–36.0)
MCV: 96.7 fL (ref 80.0–100.0)
Platelets: 160 10*3/uL (ref 150–400)
RBC: 4.3 MIL/uL (ref 3.87–5.11)
RDW: 12 % (ref 11.5–15.5)
WBC: 9.3 10*3/uL (ref 4.0–10.5)
nRBC: 0 % (ref 0.0–0.2)

## 2021-06-12 MED ORDER — METHOCARBAMOL 500 MG PO TABS
500.0000 mg | ORAL_TABLET | Freq: Three times a day (TID) | ORAL | 0 refills | Status: DC | PRN
Start: 2021-06-12 — End: 2022-09-22

## 2021-06-12 MED ORDER — ASPIRIN 81 MG PO TBEC: 81.0000 mg | DELAYED_RELEASE_TABLET | Freq: Every day | ORAL | 0 refills | Status: DC

## 2021-06-12 MED ORDER — DOCUSATE SODIUM 100 MG PO CAPS: 100.0000 mg | ORAL_CAPSULE | Freq: Two times a day (BID) | ORAL | 0 refills | Status: DC

## 2021-06-12 MED ORDER — OXYCODONE HCL 5 MG PO TABS
5.0000 mg | ORAL_TABLET | ORAL | 0 refills | Status: DC | PRN
Start: 2021-06-12 — End: 2021-06-15

## 2021-06-12 NOTE — Progress Notes (Signed)
Pt doing well. Pt given D/C instructions with verbal understanding. Rx's were sent to the pharmacy by MD. Pt's incision is clean and dry with no sign of infection. Pt's IV was removed prior to D/C. Pt D/C'd home via wheelchair per MD order. Pt is stable @ D/C and has no other needs at this time. Marven Veley, RN  

## 2021-06-12 NOTE — Evaluation (Signed)
Physical Therapy Evaluation ?Patient Details ?Name: [name] ?MRN: [id number] ?DOB: [birth year] ?Today's Date: 06/12/2021 ? ?History of Present Illness ? 66 yo female s/p Reverse tSA PMH anxiety arthritis bipolar CHF COPD CAD depression DM fribromyalgia head injury MI seizure back surg, L THA  ?Clinical Impression ? Patient admitted following above procedure. Patient functioning at modI level for mobility with no AD. Able to negotiate 10 stairs in preparation to access home at discharge. No further skilled PT needs identified acutely. No PT follow up recommended at this time.    ?   ? ?Recommendations for follow up therapy are one component of a multi-disciplinary discharge planning process, led by the attending physician.  Recommendations may be updated based on patient status, additional functional criteria and insurance authorization. ? ?Follow Up Recommendations No PT follow up ? ?  ?Assistance Recommended at Discharge PRN  ?Patient can return home with the following ?   ? ?  ?Equipment Recommendations None recommended by PT  ?Recommendations for Other Services ?    ?  ?Functional Status Assessment Patient has not had a recent decline in their functional status  ? ?  ?Precautions / Restrictions Precautions ?Precautions: Shoulder ?Type of Shoulder Precautions: reverse ?Shoulder Interventions: Shoulder sling/immobilizer;Off for dressing/bathing/exercises ?Precaution Comments: shoulder protocol ?Required Braces or Orthoses: Sling ?Restrictions ?Weight Bearing Restrictions: Yes ?RUE Weight Bearing: Non weight bearing  ? ?  ? ?Mobility ? Bed Mobility ?Overal bed mobility: Modified Independent ?  ?  ?  ?  ?  ?  ?  ?  ? ?Transfers ?Overall transfer level: Modified independent ?  ?  ?  ?  ?  ?  ?  ?  ?  ?  ? ?Ambulation/Gait ?Ambulation/Gait assistance: Modified independent (Device/Increase time) ?Gait Distance (Feet): 400 Feet ?Assistive device: None ?Gait Pattern/deviations: WFL(Within Functional Limits) ?  ?Gait  velocity interpretation: >4.37 ft/sec, indicative of normal walking speed ?  ?  ? ?Stairs ?Stairs: Yes ?Stairs assistance: Modified independent (Device/Increase time) ?Stair Management: One rail Left, Alternating pattern, Forwards ?Number of Stairs: 10 ?  ? ?Wheelchair Mobility ?  ? ?Modified Rankin (Stroke Patients Only) ?  ? ?  ? ?Balance Overall balance assessment: Modified Independent ?  ?  ?  ?  ?  ?  ?  ?  ?  ?  ?  ?  ?  ?  ?  ?  ?  ?  ?   ? ? ? ?Pertinent Vitals/Pain Pain Assessment ?Pain Assessment: 0-10 ?Pain Score: 5  ?Pain Location: R shoulder ?Pain Descriptors / Indicators: Discomfort ?Pain Intervention(s): Monitored during session, Repositioned  ? ? ?Home Living Family/patient expects to be discharged to:: Private residence ?Living Arrangements: Children ?Available Help at Discharge: Family ?Type of Home: House ?Home Access: Stairs to enter ?Entrance Stairs-Rails: None ?Entrance Stairs-Number of Steps: 15 ?  ?Home Layout: Multi-level ?Home Equipment: Conservation officer, nature (2 wheels);Shower seat ?Additional Comments: daughter and granddaughter live in the home  ?  ?Prior Function Prior Level of Function : Independent/Modified Independent ?  ?  ?  ?  ?  ?  ?  ?  ?  ? ? ?Hand Dominance  ? Dominant Hand: Right ? ?  ?Extremity/Trunk Assessment  ? Upper Extremity Assessment ?Upper Extremity Assessment: Defer to OT evaluation ?  ? ?Lower Extremity Assessment ?Lower Extremity Assessment: Overall WFL for tasks assessed ?  ? ?   ?Communication  ? Communication: No difficulties  ?Cognition Arousal/Alertness: Awake/alert ?Behavior During Therapy: [facility] for tasks assessed/performed ?Overall Cognitive Status:  Within Functional Limits for tasks assessed ?  ?  ?  ?  ?  ?  ?  ?  ?  ?  ?  ?  ?  ?  ?  ?  ?  ?  ?  ? ?  ?General Comments   ? ?  ?Exercises    ? ?Assessment/Plan  ?  ?PT Assessment Patient does not need any further PT services  ?PT Problem List   ? ?   ?  ?PT Treatment Interventions     ? ?PT Goals (Current goals can  be found in the Care Plan section)  ?Acute Rehab PT Goals ?Patient Stated Goal: to go home ?PT Goal Formulation: All assessment and education complete, DC therapy ? ?  ?Frequency   ?  ? ? ?Co-evaluation   ?  ?  ?  ?  ? ? ?  ?AM-PAC PT "6 Clicks" Mobility  ?Outcome Measure Help needed turning from your back to your side while in a flat bed without using bedrails?: None ?Help needed moving from lying on your back to sitting on the side of a flat bed without using bedrails?: None ?Help needed moving to and from a bed to a chair (including a wheelchair)?: None ?Help needed standing up from a chair using your arms (e.g., wheelchair or bedside chair)?: None ?Help needed to walk in hospital room?: None ?Help needed climbing 3-5 steps with a railing? : None ?6 Click Score: 24 ? ?  ?End of Session Equipment Utilized During Treatment: Other (comment) (sling) ?Activity Tolerance: Patient tolerated treatment well ?Patient left: in bed;with call bell/phone within reach ?Nurse Communication: Mobility status ?PT Visit Diagnosis: Muscle weakness (generalized) (M62.81) ?  ? ?Time: [id number] ?PT Time Calculation (min) (ACUTE ONLY): 10 min ? ? ?Charges:   PT Evaluation ?$PT Eval Low Complexity: 1 Low ?  ?  ?   ? ? ?[name], PT, DPT ?Acute Rehabilitation Services ?Pager [phone] ?Office [phone] ? ? ?[name] ?06/12/2021, 8:35 AM ? ?

## 2021-06-12 NOTE — Progress Notes (Signed)
.Occupational Therapy Treatment ?Patient Details ?Name: [name] ?MRN: [id number] ?DOB: 1955/04/24 ?Today's Date: 06/12/2021 ? ? ?History of present illness 66 yo female s/p Reverse tSA PMH anxiety arthritis bipolar CHF COPD CAD depression DM fribromyalgia head injury MI seizure back surg, L THA ?  ?OT comments ? All education is complete and patient indicates understanding. Pt able to complete dressing during session and demonstrate pendulum exercises. Pt has expressed CPM machine at home from previous surgery. Pt was not educated on machine this session as no order to educate. Recommendation for outpatient follow per MD orders. ?  ? ?Recommendations for follow up therapy are one component of a multi-disciplinary discharge planning process, led by the attending physician.  Recommendations may be updated based on patient status, additional functional criteria and insurance authorization. ?   ?Follow Up Recommendations ? Outpatient OT  ?  ?Assistance Recommended at Discharge    ?Patient can return home with the following ? A little help with walking and/or transfers;A little help with bathing/dressing/bathroom;Assistance with cooking/housework;Assistance with feeding ?  ?Equipment Recommendations ? None recommended by OT  ?  ?Recommendations for Other Services   ? ?  ?Precautions / Restrictions Precautions ?Precautions: Shoulder ?Type of Shoulder Precautions: reverse ?Shoulder Interventions: Shoulder sling/immobilizer;Off for dressing/bathing/exercises ?Precaution Comments: shoulder protocol ?Required Braces or Orthoses: Sling ?Restrictions ?Weight Bearing Restrictions: Yes ?RUE Weight Bearing: Non weight bearing  ? ? ?  ? ?Mobility Bed Mobility ?Overal bed mobility: Modified Independent ?  ?  ?  ?  ?  ?  ?  ?  ? ?Transfers ?Overall transfer level: Modified independent ?  ?  ?  ?  ?  ?  ?  ?  ?  ?  ?  ?Balance   ?  ?  ?  ?  ?  ?  ?  ?  ?  ?  ?  ?  ?  ?  ?  ?  ?  ?  ?   ? ?ADL either performed or assessed with  clinical judgement  ? ?ADL Overall ADL's : Modified independent ?  ?  ?  ?  ?  ?  ?  ?  ?  ?  ?  ?  ?  ?  ?  ?  ?  ?  ?  ?General ADL Comments: see Shoulder section. pt dressing for home this session ?  ? ?Extremity/Trunk Assessment Upper Extremity Assessment ?Upper Extremity Assessment: RUE deficits/detail ?RUE Deficits / Details: pt with cues to keep arm in passive positioning due to pt wanting to use AROM . pt is R UE dominant ?  ?Lower Extremity Assessment ?Lower Extremity Assessment: Overall WFL for tasks assessed ?  ?  ?  ? ?Vision   ?  ?  ?Perception   ?  ?Praxis   ?  ? ?Cognition Arousal/Alertness: Awake/alert ?Behavior During Therapy: [facility] for tasks assessed/performed ?Overall Cognitive Status: Within Functional Limits for tasks assessed ?  ?  ?  ?  ?  ?  ?  ?  ?  ?  ?  ?  ?  ?  ?  ?  ?  ?  ?  ?   ?Exercises Exercises: Shoulder ?Shoulder Exercises ?Pendulum Exercise: PROM, Right, 10 reps, Standing ?Other Exercises ?Other Exercises: Pt and daughter without any further questions or concerns. daughter present for all education. ? ?  ?Shoulder Instructions Shoulder Instructions ?Donning/doffing shirt without moving shoulder: Independent ?Method for sponge bathing under operated UE: Independent ?Donning/doffing sling/immobilizer: Independent ?Correct positioning of  sling/immobilizer: Independent ?Pendulum exercises (written home exercise program): Supervision/safety;Modified independent;Patient able to independently direct caregiver ?ROM for elbow, wrist and digits of operated UE: Independent ?Sling wearing schedule (on at all times/off for ADL's): Independent ?Positioning of UE while sleeping: Modified independent ? ? ?  ?General Comments dressing dry and intact  ? ? ?Pertinent Vitals/ Pain       Pain Assessment ?Pain Assessment: No/denies pain ? ?Home Living Family/patient expects to be discharged to:: Private residence ?Living Arrangements: Children ?Available Help at Discharge: Family ?Type of Home:  House ?Home Access: Stairs to enter ?Entrance Stairs-Number of Steps: 15 ?Entrance Stairs-Rails: None ?Home Layout: Multi-level ?  ?Alternate Level Stairs-Rails: Right ?Bathroom Shower/Tub: Tub/shower unit ?  ?Bathroom Toilet: Handicapped height ?Bathroom Accessibility: Yes ?  ?Home Equipment: Conservation officer, nature (2 wheels);Shower seat ?  ?Additional Comments: daughter and granddaughter live in the home ?  ? ?  ?Prior Functioning/Environment    ?  ?  ?  ?   ? ?Frequency ? Min 2X/week  ? ? ? ? ?  ?Progress Toward Goals ? ?OT Goals(current goals can now be found in the care plan section) ? Progress towards OT goals: Progressing toward goals ? ?Acute Rehab OT Goals ?Patient Stated Goal: to go home today ?OT Goal Formulation: With patient/family ?Time For Goal Achievement: 06/25/21 ?Potential to Achieve Goals: Good ?ADL Goals ?Additional ADL Goal #1: pt will don doff sling with family mod I ?Additional ADL Goal #2: pt will demonstrate shoulder HEP ( pendulums/ AROM hand wrist elbow) mod I with handouts ?Additional ADL Goal #3: pt will complete basic transfer mod I as precursor to adls  ?Plan Discharge plan remains appropriate   ? ?Co-evaluation ? ? ?   ?  ?  ?  ?  ? ?  ?AM-PAC OT "6 Clicks" Daily Activity     ?Outcome Measure ? ? Help from another person eating meals?: None ?Help from another person taking care of personal grooming?: None ?Help from another person toileting, which includes using toliet, bedpan, or urinal?: None ?Help from another person bathing (including washing, rinsing, drying)?: None ?Help from another person to put on and taking off regular upper body clothing?: None ?Help from another person to put on and taking off regular lower body clothing?: None ?6 Click Score: 24 ? ?  ?End of Session   ? ?OT Visit Diagnosis: Unsteadiness on feet (R26.81);Muscle weakness (generalized) (M62.81) ?  ?Activity Tolerance Patient tolerated treatment well ?  ?Patient Left in bed;with call bell/phone within reach;with  family/visitor present ?  ?Nurse Communication Mobility status;Precautions;Weight bearing status ?  ? ?   ? ?Time: [id number] ?OT Time Calculation (min): 16 min ? ?Charges: OT General Charges ?$OT Visit: 1 Visit ?OT Treatments ?$Self Care/Home Management : 8-22 mins ? ? ?[name], OTR/L  ?Acute Rehabilitation Services ?Pager: [phone] ?Office: [phone] ?. ? ? ?Jeri Modena ?06/12/2021, 9:28 AM ?

## 2021-06-12 NOTE — Care Management (Signed)
?  Transition of Care (TOC) Screening Note ? ? ?Patient Details  ?Name: [name] ?Date of Birth: [birth year] ? ? ?Transition of Care (TOC) CM/SW Contact:    ?Lawerance Sabal, RN ?Phone Number: ?06/12/2021, 11:47 AM ? ? ? ?Transition of Care Department [facility]) has reviewed patient and no TOC needs have been identified at this time.  ? ?Any DME needs will be provided through unit staff ?

## 2021-06-12 NOTE — Progress Notes (Signed)
?  Subjective: ?[name] is a 66 y.o. 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  They are POD 1.  Pt's pain is moderate but controlled.  Denies any chest pain or shortness of breath.  Only complains of shoulder pain.  Block is worn off.  She has regained full function of her hand.  Performed well with physical and Occupational Therapy.  She was able to ambulate without difficulty.  Ready for discharge home..   ? ?Objective: ?Vital signs in last 24 hours: ?Temp:  [98.2 ?F (36.8 ?C)-98.5 ?F (36.9 ?C)] 98.2 ?F (36.8 ?C) (03/15 0737) ?Pulse Rate:  [66-87] 71 (03/15 0737) ?Resp:  [16-18] 16 (03/15 0737) ?BP: (111-121)/(64-80) 118/80 (03/15 0737) ?SpO2:  [94 %-97 %] 94 % (03/15 0737) ? ?Intake/Output from previous day: ?03/14 0701 - 03/15 0700 ?In: 1749.9 [P.O.:630; I.V.:820; IV Piggyback:299.9] ?Out: 100 [Blood:100] ?Intake/Output this shift: ?No intake/output data recorded. ? ?Exam: ? ?No gross blood or drainage overlying the dressing ?2+ radial pulse ?Sensation intact distally in the right hand ?Able to extend the right wrist.  Intact EPL, FPL, finger abduction, finger adduction, bicep, tricep, deltoid.  Axillary nerve is intact with deltoid firing. ? ? ?Labs: ?Recent Labs  ?  06/12/21 ?0635  ?HGB 14.0  ? ?Recent Labs  ?  06/12/21 ?0635  ?WBC 9.3  ?RBC 4.30  ?HCT 41.6  ?PLT 160  ? ?Recent Labs  ?  06/12/21 ?0635  ?NA 136  ?K 3.5  ?CL 102  ?CO2 26  ?BUN 9  ?CREATININE 0.76  ?GLUCOSE 121*  ?CALCIUM 8.9  ? ?No results for input(s): LABPT, INR in the last 72 hours. ? 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 -Plan to discharge to home today ? -No lifting with the operative arm ? -She will call the office if her pain is not well controlled with the oxycodone since she does have a history of taking hydrocodone 7.5 mg.  Follow-up with Dr. August Saucer in about 2 weeks. ?  ? ? ?[name]  [name] ?06/12/2021, 11:44 AM  ? ? ?   ?

## 2021-06-13 ENCOUNTER — Encounter (HOSPITAL_COMMUNITY): Payer: Self-pay | Admitting: Orthopedic Surgery

## 2021-06-13 ENCOUNTER — Telehealth: Payer: Self-pay | Admitting: Orthopedic Surgery

## 2021-06-13 ENCOUNTER — Telehealth: Payer: Self-pay | Admitting: Nurse Practitioner

## 2021-06-13 NOTE — Telephone Encounter (Signed)
error 

## 2021-06-13 NOTE — Telephone Encounter (Signed)
Pt is calling she states that her medication was called in wrong -- ? ?Please call the patient  ?

## 2021-06-15 ENCOUNTER — Encounter: Payer: Self-pay | Admitting: Nurse Practitioner

## 2021-06-15 ENCOUNTER — Ambulatory Visit: Admission: EM | Admit: 2021-06-15 | Discharge: 2021-06-15 | Discharge status: Absent without leave | Payer: 59

## 2021-06-15 ENCOUNTER — Telehealth: Payer: 59

## 2021-06-15 ENCOUNTER — Other Ambulatory Visit: Payer: Self-pay

## 2021-06-15 ENCOUNTER — Other Ambulatory Visit: Payer: Self-pay | Admitting: Surgical

## 2021-06-15 MED ORDER — OXYCODONE HCL 5 MG PO TABS: 5.0000 mg | ORAL_TABLET | ORAL | 0 refills | Status: DC | PRN

## 2021-06-19 ENCOUNTER — Telehealth: Payer: Self-pay | Admitting: Orthopedic Surgery

## 2021-06-19 NOTE — Telephone Encounter (Signed)
Yeah that's still the plan; she may have had HHPT for her index procedure ~2 years ago.  I will call her back and reassure her that CPM is all she needs until first postop. ? ?Called her and she has CPM from first surgery, cant remember how to use it. Can you have someone from [employer] call her to remind?

## 2021-06-19 NOTE — Telephone Encounter (Signed)
Patient called. Says she has not heard from anyone about having PT. Would like to know if someone would be calling her. Her call back number is [phone] ?

## 2021-06-20 NOTE — Telephone Encounter (Signed)
Have messaged Kipp Brood and Harrold Donath with [employer] ?

## 2021-06-21 NOTE — Telephone Encounter (Signed)
[employer] called patient yesterday ?

## 2021-06-22 DIAGNOSIS — Z96619 Presence of unspecified artificial shoulder joint: Secondary | ICD-10-CM

## 2021-06-22 DIAGNOSIS — T84028A Dislocation of other internal joint prosthesis, initial encounter: Secondary | ICD-10-CM

## 2021-06-26 ENCOUNTER — Other Ambulatory Visit: Payer: Self-pay

## 2021-06-26 ENCOUNTER — Ambulatory Visit (INDEPENDENT_AMBULATORY_CARE_PROVIDER_SITE_OTHER): Payer: 59

## 2021-06-26 ENCOUNTER — Other Ambulatory Visit: Payer: Self-pay | Admitting: Nurse Practitioner

## 2021-06-26 ENCOUNTER — Encounter: Payer: Self-pay | Admitting: Orthopedic Surgery

## 2021-06-26 ENCOUNTER — Ambulatory Visit (INDEPENDENT_AMBULATORY_CARE_PROVIDER_SITE_OTHER): Payer: 59 | Admitting: Surgical

## 2021-06-26 DIAGNOSIS — E785 Hyperlipidemia, unspecified: Secondary | ICD-10-CM

## 2021-06-26 DIAGNOSIS — I10 Essential (primary) hypertension: Secondary | ICD-10-CM

## 2021-06-26 DIAGNOSIS — Z96611 Presence of right artificial shoulder joint: Secondary | ICD-10-CM

## 2021-06-26 DIAGNOSIS — F3341 Major depressive disorder, recurrent, in partial remission: Secondary | ICD-10-CM

## 2021-06-26 DIAGNOSIS — F5101 Primary insomnia: Secondary | ICD-10-CM

## 2021-06-26 DIAGNOSIS — F411 Generalized anxiety disorder: Secondary | ICD-10-CM

## 2021-06-26 DIAGNOSIS — K2100 Gastro-esophageal reflux disease with esophagitis, without bleeding: Secondary | ICD-10-CM

## 2021-06-26 MED ORDER — OXYCODONE HCL 5 MG PO TABS
5.0000 mg | ORAL_TABLET | Freq: Three times a day (TID) | ORAL | 0 refills | Status: DC | PRN
Start: 2021-06-26 — End: 2021-07-01

## 2021-06-26 NOTE — Progress Notes (Signed)
? ?Post-Op Visit Note ?  ?Patient: [name]           ?Date of Birth: Aug 30, 1955           ?MRN: [id number] ?Visit Date: 06/26/2021 ?PCP: Chevis Pretty, FNP ? ? ?Assessment & Plan: ? ?Chief Complaint:  ?Chief Complaint  ?Patient presents with  ? Right Shoulder - Routine Post Op  ? ?Visit Diagnoses:  ?1. History of arthroplasty of right shoulder   ? ? ?Plan: Patient is a 66 year old female who presents s/p right total shoulder arthroplasty to reverse shoulder arthroplasty conversion for instability.  She is about 2 weeks out from procedure.  She is using CPM machine every day as directed.  Denies any fevers or chills.  She is in a sling.  She states that she still has a lot of pain and takes oxycodone twice daily, 1 in the morning and 1 in in the evening before going to bed.  Denies any other complaints aside from shoulder pain.  She denies any instability or subluxation events since procedure.  On exam, she has pretty good range of motion for 2 weeks out from surgery with 20 degrees external rotation, 90 degrees abduction, 90 degrees forward flexion.  Axillary nerve is intact with deltoid firing.  Incision looks to be healing well without evidence of infection or dehiscence.  There is no surrounding cellulitis around the incision.  Plan is to refill oxycodone today.  She will be weaned off this to her regular pain medication over the next couple weeks.  She will continue with home exercise program and using the CPM machine and follow-up with the office in 4 weeks for clinical recheck with Dr. Marlou Sa.  Call the office with any concerns in the meantime.  Home exercise program was provided to her today and explained. ? ?Follow-Up Instructions: No follow-ups on file.  ? ?Orders:  ?Orders Placed This Encounter  ?Procedures  ? XR Shoulder Right  ? ?Meds ordered this encounter  ?Medications  ? oxyCODONE (OXY IR/ROXICODONE) 5 MG immediate release tablet  ?  Sig: Take 1-2 tablets (5-10 mg total) by mouth every  8 (eight) hours as needed for moderate pain (pain score 4-6).  ?  Dispense:  30 tablet  ?  Refill:  0  ? ? ?Imaging: ?No results found. ? ?PMFS History: ?Patient Active Problem List  ? Diagnosis Date Noted  ? 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 Hypokalemia 11/25/2020  ? S/P laparoscopic fundoplication [id number]  ? Status post total replacement of left hip 01/31/2020  ? Left ear pain 01/10/2020  ? Non-recurrent acute serous otitis media of both ears 01/10/2020  ? Unilateral primary osteoarthritis, left hip 12/21/2019  ? S/P shoulder replacement, right 11/15/2019  ? Overweight (BMI 25.0-29.9) 04/15/2019  ? Diverticulosis 04/14/2019  ? Chronic diastolic CHF (congestive heart failure) ([place]) 04/14/2019  ? Unstable angina ([place]) 10/20/2017  ? Chronic pain 09/23/2016  ? Pre-diabetes 09/02/2016  ? Recurrent major depressive disorder, in partial remission ([place]) 07/14/2016  ? Seizures ([place]) 07/14/2016  ? GAD (generalized anxiety disorder) 07/14/2016  ? Insomnia 09/05/2014  ? Allergic rhinitis 09/05/2014  ? Cervical spondylosis without myelopathy 12/19/2013  ?  Class: Chronic  ? Neural foraminal stenosis of cervical spine 12/19/2013  ? Cervical radiculitis 09/19/2013  ? Lumbosacral spondylosis without myelopathy 11/16/2012  ? Postlaminectomy syndrome, lumbar region 11/16/2012  ? Herpes simplex virus (HSV) infection 10/31/2008  ? Hyperlipidemia with target LDL  less than 100 10/31/2008  ? Essential hypertension 10/31/2008  ? GERD 10/31/2008  ? SPINAL STENOSIS OF LUMBAR REGION 10/31/2008  ? Myalgia 10/31/2008  ? Osteoporosis 10/31/2008  ? SPONDYLOLISTHESIS 10/31/2008  ? SYNCOPE 10/31/2008  ? Sleep apnea 10/31/2008  ? ?Past Medical History:  ?Diagnosis Date  ? Allergy   ? Anemia   ? Anginal pain ([place])   ? last time   ? Anxiety   ? 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 Asthma   ? Bipolar 1 disorder ([place])   ? Blood transfusion without reported diagnosis   ? Cataracts,  bilateral 07/2017  ? CHF (congestive heart failure) ([place])   ? COPD (chronic obstructive pulmonary disease) ([place])   ? Coronary artery disease   ? reported hx of "MI";  Echo 2009 with normal LVF;  Myoview 05/2011: no ischemia  ? Depression   ? Diabetes mellitus without complication ([place])   ? Dyslipidemia   ? Dysrhythmia   ? SVT  ? Esophageal stricture   ? Fibromyalgia   ? GERD (gastroesophageal reflux disease)   ? H/O hiatal hernia   ? Head injury, unspecified   ? Headache   ? migraines  ? Herpes simplex infection   ? History of kidney stones   ? History of loop recorder   ? Managed by Dr. Crissie Sickles  ? Hyperlipidemia   ? Hypertension   ? Insomnia   ? Myocardial infarction [facility])   ? age 77  ? Osteoporosis   ? Pneumonia   ? hx  ? Seizures ([place])   ? Shortness of breath   ? Sleep apnea   ? ? neg  ? Spinal stenosis of lumbar region   ? Spondylolisthesis   ? Status post placement of implantable loop recorder   ? Supraventricular tachycardia ([place])   ? Syncope and collapse   ? s/p ILR; no arhythmogenic cause identified  ? UTI (lower urinary tract infection)   ?  ?Family History  ?Problem Relation Age of Onset  ? Heart attack Father   ? Mental illness Father   ? Mental illness Mother   ? Heart attack Brother   ?     stents  ? Alcohol abuse Brother   ? Heart disease Brother   ? Drug abuse Brother   ? Diabetes Brother   ? Colon cancer Maternal Aunt   ? Cirrhosis Brother   ? Stomach cancer Neg Hx   ? Esophageal cancer Neg Hx   ? Rectal cancer Neg Hx   ?  ?Past Surgical History:  ?Procedure Laterality Date  ? BACK SURGERY    ? BREAST SURGERY    ? lumpectomy  ? CARDIAC CATHETERIZATION  10/06/2011  ? CATARACT EXTRACTION W/PHACO Right 07/31/2017  ? Procedure: CATARACT EXTRACTION PHACO AND INTRAOCULAR LENS PLACEMENT (IOC);  Surgeon: [name], MD;  Location: [facility];  Service: Ophthalmology;  Laterality: Right;  CDE: 2.33  ? CATARACT EXTRACTION W/PHACO Left 08/14/2017  ? Procedure: CATARACT EXTRACTION PHACO AND INTRAOCULAR LENS  PLACEMENT (IOC);  Surgeon: [name], MD;  Location: [facility];  Service: Ophthalmology;  Laterality: Left;  CDE: 2.74  ? CHOLECYSTECTOMY    ? CYSTOSCOPY    ? stone  ? DIAGNOSTIC LAPAROSCOPY    ? laparoscopic cholecystectomy  ? DOPPLER ECHOCARDIOGRAPHY  2009  ? ESOPHAGOGASTRODUODENOSCOPY N/A 10/31/2020  ? Procedure: ESOPHAGOGASTRODUODENOSCOPY (EGD);  Surgeon: Greer Pickerel, MD;  Location: [facility];  Service: General;  Laterality: N/A;  ? EYE SURGERY Bilateral 08/14/2017  ? cataract removal  ?  head up tilt table testing  06/15/2007  ? [name]  ? HEMORRHOID SURGERY    ? HERNIA REPAIR    ? insertion of implatable loop recorder  08/11/2007  ? [name]  ? POSTERIOR CERVICAL FUSION/FORAMINOTOMY N/A 12/19/2013  ? Procedure: RIGHT C3-4.C4-5 AND C5-6 FORAMINOTOMIES;  Surgeon: [name], MD;  Location: [facility];  Service: Orthopedics;  Laterality: N/A;  ? TOTAL HIP ARTHROPLASTY Left 01/31/2020  ? Procedure: LEFT TOTAL HIP ARTHROPLASTY ANTERIOR APPROACH;  Surgeon: Mcarthur Rossetti, MD;  Location: [facility];  Service: Orthopedics;  Laterality: Left;  ? TOTAL SHOULDER ARTHROPLASTY Right 11/15/2019  ? Procedure: RIGHT TOTAL SHOULDER ARTHROPLASTY;  Surgeon: Meredith Pel, MD;  Location: [facility];  Service: Orthopedics;  Laterality: Right;  ? TOTAL SHOULDER REVISION Right 06/11/2021  ? Procedure: RIGHT SHOULDER CONVERSION TOTAL SHOULDER ARTHROPLASTY to REVERSE TOTAL SHOULDER ARTHROPLASTY;  Surgeon: Meredith Pel, MD;  Location: [facility];  Service: Orthopedics;  Laterality: Right;  ? TUBAL LIGATION    ? UPPER GASTROINTESTINAL ENDOSCOPY    ? XI ROBOTIC ASSISTED HIATAL HERNIA REPAIR N/A 10/31/2020  ? Procedure: XI ROBOTIC ASSISTED HIATAL HERNIA REPAIR WITH PARTIAL FUNDOPLICATION;  Surgeon: Greer Pickerel, MD;  Location: [facility];  Service: General;  Laterality: N/A;  ? ?Social History  ? ?Occupational History  ? Occupation: Disability  ?  Comment: 15 years  ?Tobacco Use  ? Smoking status: Never  ? Smokeless tobacco:  Never  ?Vaping Use  ? Vaping Use: Never used  ?Substance and Sexual Activity  ? Alcohol use: No  ?  Alcohol/week: 0.0 standard drinks  ? Drug use: No  ? Sexual activity: Yes  ? ? ? ?

## 2021-06-27 ENCOUNTER — Other Ambulatory Visit: Payer: Self-pay | Admitting: Cardiology

## 2021-06-27 ENCOUNTER — Other Ambulatory Visit: Payer: Self-pay | Admitting: Nurse Practitioner

## 2021-06-27 DIAGNOSIS — R569 Unspecified convulsions: Secondary | ICD-10-CM

## 2021-06-27 NOTE — Discharge Summary (Signed)
Physician Discharge Summary  ? ? ? ? ?Patient ID: ?[name] ?MRN: [id number] ?DOB/AGE: 1955/07/30 66 y.o. ? ?Admit date: 06/11/2021 ?Discharge date: 06/12/2021 ? ?Admission Diagnoses:  ?Principal Problem: ?  S/P reverse total shoulder arthroplasty, right ?Active Problems: ?  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 ? ?Discharge Diagnoses:  ?Same ? ?Surgeries: Procedure(s): ?RIGHT SHOULDER CONVERSION TOTAL SHOULDER ARTHROPLASTY to REVERSE TOTAL SHOULDER ARTHROPLASTY on 06/11/2021 ?  ?Consultants:  ? ?Discharged Condition: Stable ? ?Hospital Course: [name] is an 66 y.o. female who was admitted 06/11/2021 with a chief complaint of right shoulder instability, and found to have a diagnosis of right shoulder subscapularis tear contributing to instability of shoulder joint.  They were brought to the operating room on 06/11/2021 and underwent the above named procedures.  Pt awoke from anesthesia without complication and was transferred to the floor. On POD1, patient's pain was overall controlled and block was in effect.  She was able to mobilize well without any complaint of chest pain, shortness of breath, dizziness, other medical complaint.  She had no instability on the evening and day after surgery.  She was discharged home on POD 1..  Pt will f/u with Dr. Marlou Sa in clinic in ~2 weeks.  ? ?Antibiotics given:  ?Anti-infectives (From admission, onward)  ? ? Start     Dose/Rate Route Frequency Ordered Stop  ? 06/11/21 2030  fluconazole (DIFLUCAN) tablet 100 mg  Status:  Discontinued       ? 100 mg Oral Daily 06/11/21 1935 06/12/21 1811  ? 06/11/21 1400  ceFAZolin (ANCEF) IVPB 2g/100 mL premix       ? 2 g ?200 mL/hr over 30 Minutes Intravenous Every 8 hours 06/11/21 1136 06/12/21 0553  ? 06/11/21 0916  vancomycin (VANCOCIN) powder  Status:  Discontinued       ?   As needed 06/11/21 0916 06/11/21 0953  ? 06/11/21 0600  ceFAZolin (ANCEF) IVPB 2g/100 mL premix       ? 2 g ?200 mL/hr over 30 Minutes  Intravenous On call to O.R. 06/11/21 2563 06/11/21 0739  ? ?  ?. ? ?Recent vital signs:  ?Vitals:  ? 06/12/21 0304 06/12/21 0737  ?BP: 121/76 118/80  ?Pulse: 66 71  ?Resp: 18 16  ?Temp: 98.5 ?F (36.9 ?C) 98.2 ?F (36.8 ?C)  ?SpO2: 97% 94%  ? ? ?Recent laboratory studies:  ?Results for orders placed or performed during the hospital encounter of 06/11/21  ?Glucose, capillary  ?Result Value Ref Range  ? Glucose-Capillary 87 70 - 99 mg/dL  ?Glucose, capillary  ?Result Value Ref Range  ? Glucose-Capillary 122 (H) 70 - 99 mg/dL  ? Comment 1 Notify RN   ? Comment 2 Document in Chart   ?Glucose, capillary  ?Result Value Ref Range  ? Glucose-Capillary 155 (H) 70 - 99 mg/dL  ? Comment 1 Notify RN   ? Comment 2 Document in Chart   ?CBC  ?Result Value Ref Range  ? WBC 9.3 4.0 - 10.5 K/uL  ? RBC 4.30 3.87 - 5.11 MIL/uL  ? Hemoglobin 14.0 12.0 - 15.0 g/dL  ? HCT 41.6 36.0 - 46.0 %  ? MCV 96.7 80.0 - 100.0 fL  ? MCH 32.6 26.0 - 34.0 pg  ? MCHC 33.7 30.0 - 36.0 g/dL  ? RDW 12.0 11.5 - 15.5 %  ? Platelets 160 150 - 400 K/uL  ? nRBC 0.0 0.0 - 0.2 %  ?Basic metabolic panel  ?Result Value Ref Range  ? Sodium  136 135 - 145 mmol/L  ? Potassium 3.5 3.5 - 5.1 mmol/L  ? Chloride 102 98 - 111 mmol/L  ? CO2 26 22 - 32 mmol/L  ? Glucose, Bld 121 (H) 70 - 99 mg/dL  ? BUN 9 8 - 23 mg/dL  ? Creatinine, Ser 0.76 0.44 - 1.00 mg/dL  ? Calcium 8.9 8.9 - 10.3 mg/dL  ? GFR, Estimated >60 >60 mL/min  ? Anion gap 8 5 - 15  ?Glucose, capillary  ?Result Value Ref Range  ? Glucose-Capillary 116 (H) 70 - 99 mg/dL  ? Comment 1 Notify RN   ? Comment 2 Document in Chart   ?Glucose, capillary  ?Result Value Ref Range  ? Glucose-Capillary 128 (H) 70 - 99 mg/dL  ? Comment 1 Notify RN   ? Comment 2 Document in Chart   ? ?*Note: Due to a large number of results and/or encounters for the requested time period, some results have not been displayed. A complete set of results can be found in Results Review.  ? ? ?Discharge Medications:   ?Allergies as of 06/12/2021   ? ?    Reactions  ? Codeine Other (See Comments)  ? "I will have a heart attack."  ? Morphine And Related Other (See Comments)  ? "It will cause me to have a heart attack."  ? Ambien [zolpidem Tartrate] Nausea And Vomiting  ? Clonidine Derivatives Nausea And Vomiting  ? gerd - caused acid reflux per pt  ? Metformin And Related Nausea And Vomiting  ? cramping from metformin  ? Lyrica [pregabalin] Swelling, Other (See Comments)  ? Weight gain  ? Neurontin [gabapentin] Other (See Comments)  ? Causes elevated LFTs  ? ?  ? ?  ?Medication List  ?  ? ?STOP taking these medications   ? ?HYDROcodone-acetaminophen 7.5-325 MG tablet ?Commonly known as: Norco ?  ?tiZANidine 4 MG tablet ?Commonly known as: ZANAFLEX ?  ? ?  ? ?TAKE these medications   ? ?Accu-Chek Guide test strip ?Generic drug: glucose blood ?Test BS up to 4 times a day Dx E11.9 ?  ?Accu-Chek Softclix Lancets lancets ?Test BS up to 4 times a day Dx E11.9 ?  ?acyclovir ointment 5 % ?Commonly known as: ZOVIRAX ?APPLY TOPICALLY EVERY 3 HOURS. ?What changed: See the new instructions. ?  ?Advil PM 200-25 MG Caps ?Generic drug: Ibuprofen-diphenhydrAMINE HCl ?Take 2 capsules by mouth at bedtime. ?  ?albuterol 108 (90 Base) MCG/ACT inhaler ?Commonly known as: VENTOLIN HFA ?INHALE 2 PUFFS EVERY 6 HOURS AS NEEDED FOR SHORTNESS OF BREATH AND WHEEZING. ?  ?aspirin 81 MG EC tablet ?Take 1 tablet (81 mg total) by mouth daily. Swallow whole. ?  ?B-D SINGLE USE SWABS REGULAR Pads ?USE 1 PAD DAILY WHEN CHECKING BLOOD SUGAR. R73.03 ?  ?blood glucose meter kit and supplies ?Dispense based on patient and insurance preference. Use up to four times daily as directed. (FOR ICD-10 E10.9, E11.9). ?  ?busPIRone 10 MG tablet ?Commonly known as: BUSPAR ?Take 1 tablet (10 mg total) by mouth 2 (two) times daily as needed. ?What changed: when to take this ?  ?butalbital-acetaminophen-caffeine 50-325-40 MG tablet ?Commonly known as: FIORICET ?TAKE 1 TABLET BY MOUTH EVERY 6 HOURS AS NEEDED FOR  HEADACHE. ?  ?carvedilol 3.125 MG tablet ?Commonly known as: COREG ?Take 0.5 tablets (1.5625 mg total) by mouth 2 (two) times daily with a meal. BP over 150 before taken ?What changed:  ?how much to take ?when to take this ?reasons to take this ?  ?  celecoxib 200 MG capsule ?Commonly known as: CELEBREX ?TAKE 1 CAPSULE BY MOUTH TWICE DAILY BETWEEN MEALS AS NEEDED. ?  ?dexlansoprazole 60 MG capsule ?Commonly known as: Dexilant ?Take 1 capsule (60 mg total) by mouth daily. ?  ?dextromethorphan-guaiFENesin 30-600 MG 12hr tablet ?Commonly known as: [place] DM ?Take 1 tablet by mouth 2 (two) times daily. ?  ?diclofenac Sodium 1 % Gel ?Commonly known as: VOLTAREN ?APPLY 4 GRAMS TOPICALLY 4 TIMES A DAY. ?What changed: See the new instructions. ?  ?docusate sodium 100 MG capsule ?Commonly known as: COLACE ?Take 1 capsule (100 mg total) by mouth 2 (two) times daily. ?  ?DULoxetine 60 MG capsule ?Commonly known as: CYMBALTA ?Take 1 capsule (60 mg total) by mouth at bedtime. ?  ?empagliflozin 25 MG Tabs tablet ?Commonly known as: Jardiance ?Take 1 tablet (25 mg total) by mouth daily before breakfast. ?  ?escitalopram 20 MG tablet ?Commonly known as: LEXAPRO ?Take 1 tablet (20 mg total) by mouth daily. ?  ?estradiol 0.1 MG/GM vaginal cream ?Commonly known as: ESTRACE ?Place 1 Applicatorful vaginally at bedtime. ?What changed:  ?when to take this ?reasons to take this ?  ?FIBER PO ?Take 1 tablet by mouth in the morning and at bedtime. ?  ?fludrocortisone 0.1 MG tablet ?Commonly known as: FLORINEF ?TAKE 1 TABLET EVERY OTHER DAY. ?  ?fluticasone 50 MCG/ACT nasal spray ?Commonly known as: FLONASE ?USE 1 SPRAY IN EACH NOSTRIL ONCE DAILY. ?What changed:  ?when to take this ?reasons to take this ?  ?fluticasone furoate-vilanterol 200-25 MCG/ACT Aepb ?Commonly known as: BREO ELLIPTA ?Inhale 1 puff into the lungs daily. ?  ?fluticasone furoate-vilanterol 100-25 MCG/ACT Aepb ?Commonly known as: BREO ELLIPTA ?Inhale 1 puff into the lungs  daily. ?  ?furosemide 40 MG tablet ?Commonly known as: LASIX ?Take 1 tablet (40 mg total) by mouth daily as needed for fluid. ?  ?hydrALAZINE 25 MG tablet ?Commonly known as: APRESOLINE ?TAKE 1 TABLET BY MOUTH 3 TIME

## 2021-06-28 ENCOUNTER — Other Ambulatory Visit: Payer: Self-pay | Admitting: Cardiology

## 2021-06-28 ENCOUNTER — Other Ambulatory Visit: Payer: Self-pay | Admitting: Nurse Practitioner

## 2021-06-28 DIAGNOSIS — N941 Unspecified dyspareunia: Secondary | ICD-10-CM

## 2021-07-01 ENCOUNTER — Encounter: Payer: Self-pay | Admitting: Nurse Practitioner

## 2021-07-01 ENCOUNTER — Ambulatory Visit (INDEPENDENT_AMBULATORY_CARE_PROVIDER_SITE_OTHER): Payer: 59 | Admitting: Nurse Practitioner

## 2021-07-01 VITALS — BP 133/86 | HR 69 | Temp 98.1°F | Ht 62.0 in | Wt 121.8 lb

## 2021-07-01 DIAGNOSIS — G894 Chronic pain syndrome: Secondary | ICD-10-CM

## 2021-07-01 DIAGNOSIS — F411 Generalized anxiety disorder: Secondary | ICD-10-CM

## 2021-07-01 DIAGNOSIS — Z23 Encounter for immunization: Secondary | ICD-10-CM

## 2021-07-01 DIAGNOSIS — F5101 Primary insomnia: Secondary | ICD-10-CM

## 2021-07-01 DIAGNOSIS — K5790 Diverticulosis of intestine, part unspecified, without perforation or abscess without bleeding: Secondary | ICD-10-CM

## 2021-07-01 DIAGNOSIS — I10 Essential (primary) hypertension: Secondary | ICD-10-CM

## 2021-07-01 DIAGNOSIS — I5032 Chronic diastolic (congestive) heart failure: Secondary | ICD-10-CM | POA: Diagnosis not present

## 2021-07-01 DIAGNOSIS — E876 Hypokalemia: Secondary | ICD-10-CM

## 2021-07-01 DIAGNOSIS — E785 Hyperlipidemia, unspecified: Secondary | ICD-10-CM

## 2021-07-01 DIAGNOSIS — K2100 Gastro-esophageal reflux disease with esophagitis, without bleeding: Secondary | ICD-10-CM

## 2021-07-01 DIAGNOSIS — R7303 Prediabetes: Secondary | ICD-10-CM

## 2021-07-01 DIAGNOSIS — I200 Unstable angina: Secondary | ICD-10-CM

## 2021-07-01 DIAGNOSIS — E663 Overweight: Secondary | ICD-10-CM

## 2021-07-01 DIAGNOSIS — F3341 Major depressive disorder, recurrent, in partial remission: Secondary | ICD-10-CM

## 2021-07-01 DIAGNOSIS — R569 Unspecified convulsions: Secondary | ICD-10-CM

## 2021-07-01 DIAGNOSIS — N941 Unspecified dyspareunia: Secondary | ICD-10-CM

## 2021-07-01 DIAGNOSIS — R55 Syncope and collapse: Secondary | ICD-10-CM

## 2021-07-01 LAB — MICROSCOPIC EXAMINATION
Bacteria, UA: NONE SEEN
Epithelial Cells (non renal): NONE SEEN /hpf (ref 0–10)
RBC, Urine: NONE SEEN /hpf (ref 0–2)
Renal Epithel, UA: NONE SEEN /hpf

## 2021-07-01 LAB — URINALYSIS, COMPLETE
Bilirubin, UA: NEGATIVE
Ketones, UA: NEGATIVE
Leukocytes,UA: NEGATIVE
Nitrite, UA: NEGATIVE
Protein,UA: NEGATIVE
RBC, UA: NEGATIVE
Specific Gravity, UA: >1.03 — ABNORMAL HIGH (ref 1.005–1.030)
Urobilinogen, Ur: 0.2 mg/dL (ref 0.2–1.0)
pH, UA: 5.5 (ref 5.0–7.5)

## 2021-07-01 MED ORDER — HYDROCODONE-ACETAMINOPHEN 7.5-325 MG PO TABS: 1.0000 | ORAL_TABLET | Freq: Three times a day (TID) | ORAL | 0 refills | Status: AC

## 2021-07-01 MED ORDER — LAMOTRIGINE 150 MG PO TABS: 150.0000 mg | ORAL_TABLET | Freq: Every day | ORAL | 1 refills | Status: DC

## 2021-07-01 MED ORDER — FLUCONAZOLE 150 MG PO TABS: 150.0000 mg | ORAL_TABLET | Freq: Once | ORAL | 0 refills | Status: AC

## 2021-07-01 MED ORDER — DULOXETINE HCL 60 MG PO CPEP: 60.0000 mg | ORAL_CAPSULE | Freq: Every day | ORAL | 1 refills | Status: DC

## 2021-07-01 MED ORDER — DEXLANSOPRAZOLE 60 MG PO CPDR: 1.0000 | DELAYED_RELEASE_CAPSULE | Freq: Every day | ORAL | 1 refills | Status: DC

## 2021-07-01 MED ORDER — ROSUVASTATIN CALCIUM 10 MG PO TABS: 10.0000 mg | ORAL_TABLET | Freq: Every day | ORAL | 1 refills | Status: DC

## 2021-07-01 MED ORDER — BUSPIRONE HCL 10 MG PO TABS: 10.0000 mg | ORAL_TABLET | Freq: Two times a day (BID) | ORAL | 5 refills | Status: DC | PRN

## 2021-07-01 MED ORDER — BUTALBITAL-APAP-CAFFEINE 50-325-40 MG PO TABS: ORAL_TABLET | ORAL | 1 refills | Status: DC

## 2021-07-01 MED ORDER — HYDROCODONE-ACETAMINOPHEN 7.5-325 MG PO TABS
1.0000 | ORAL_TABLET | Freq: Three times a day (TID) | ORAL | 0 refills | Status: AC
Start: 2021-07-01 — End: 2021-07-31

## 2021-07-01 MED ORDER — CARVEDILOL 3.125 MG PO TABS: 1.5000 mg | ORAL_TABLET | Freq: Two times a day (BID) | ORAL | 1 refills | Status: DC

## 2021-07-01 MED ORDER — ESTRADIOL 0.1 MG/GM VA CREA: TOPICAL_CREAM | VAGINAL | 2 refills | Status: DC

## 2021-07-01 MED ORDER — ESCITALOPRAM OXALATE 20 MG PO TABS: 20.0000 mg | ORAL_TABLET | Freq: Every day | ORAL | 1 refills | Status: DC

## 2021-07-01 MED ORDER — FUROSEMIDE 40 MG PO TABS: ORAL_TABLET | ORAL | 1 refills | Status: DC

## 2021-07-01 MED ORDER — HYDROCODONE-ACETAMINOPHEN 7.5-325 MG PO TABS: 1.0000 | ORAL_TABLET | Freq: Three times a day (TID) | ORAL | 0 refills | Status: DC

## 2021-07-01 MED ORDER — LEVETIRACETAM 500 MG PO TABS: 500.0000 mg | ORAL_TABLET | Freq: Two times a day (BID) | ORAL | 1 refills | Status: DC

## 2021-07-01 MED ORDER — TRAZODONE HCL 150 MG PO TABS: 150.0000 mg | ORAL_TABLET | Freq: Every day | ORAL | 1 refills | Status: DC

## 2021-07-01 MED ORDER — EMPAGLIFLOZIN 25 MG PO TABS: 25.0000 mg | ORAL_TABLET | Freq: Every day | ORAL | 3 refills | Status: DC

## 2021-07-01 MED ORDER — OXYBUTYNIN CHLORIDE ER 5 MG PO TB24: 5.0000 mg | ORAL_TABLET | Freq: Every day | ORAL | 1 refills | Status: DC

## 2021-07-01 NOTE — Progress Notes (Signed)
? ?Subjective:  ? ? Patient ID: [name], female    DOB: [birth year], 66 y.o.   MRN: [id number] ? ? ?Chief Complaint: No chief complaint on file. ?  ? ?HPI: ? ?[name] is a 66 y.o. who identifies as a female who was assigned female at birth.  ? ?Social history: ?Lives with: by herself ?Work history: disability ? ? ?Comes in today for follow up of the following chronic medical issues: ? ?1. Essential hypertension ?No recent chest pain or SOB. Does occasionally check bloodpressure at home. Ususally in 322'G systolic. ?BP Readings from Last 3 Encounters:  ?06/12/21 118/80  ?06/07/21 104/78  ?05/07/21 (!) 141/89  ? ? ? ?2. Hyperlipidemia with target LDL less than 100 ?Does not really watch diet and does no dedicated exercise due to chronic back pain ?Lab Results  ?Component Value Date  ? CHOL 125 01/01/2021  ? HDL 43 01/01/2021  ? [place] 66 01/01/2021  ? LDLDIRECT 142.1 04/22/2007  ? TRIG 81 01/01/2021  ? CHOLHDL 2.9 01/01/2021  ? ? ? ?3. Chronic diastolic CHF (congestive heart failure) ([place]) ?Last saw cardiology in 12/22. At that time he was having syncopal episodes.reviewing office note showed that increasing her imdur  to 63m daily was ony chang emade. ? ?4. Gastroesophageal reflux disease with esophagitis without hemorrhage ?Takes dexilant daily- will have symptoms if she misses her meds ? ?5. Unstable angina (HCC) ?Has ocassional episodes. Her last echo was normal. ? ?6. Diverticulosis ?Denies any recent flare ups ? ?7. Seizures ([place] ?No recent seizure activity. Is on keppra daily and that seems to be working well. ? ?8. GAD (generalized anxiety disorder) ?She is on lamictal and buspar and that keep mood swings under control. ? ?  07/01/2021  ?  1:59 PM 04/02/2021  ? 11:26 AM 01/14/2021  ? 11:12 AM 01/01/2021  ?  2:50 PM  ?GAD 7 : Generalized Anxiety Score  ?Nervous, Anxious, on Edge 0 0 0 0  ?Control/stop worrying 0 0 0 0  ?Worry too much - different things 0 0 0 0  ?Trouble relaxing 0 0 0 0   ?Restless 0 0 0 0  ?Easily annoyed or irritable 0 0 0 0  ?Afraid - awful might happen 0 0 0 0  ?Total GAD 7 Score 0 0 0 0  ?Anxiety Difficulty  Not difficult at all Not difficult at all Not difficult at all  ? ? ? ? ?9. Recurrent major depressive disorder, in partial remission ([place] ?Takes lexapro and cymbalta daily. She says she thinks she is doing quite well. ? ?  07/01/2021  ?  1:59 PM 04/02/2021  ? 11:26 AM 01/14/2021  ? 11:12 AM  ?Depression screen PHQ 2/9  ?Decreased Interest 0 0 0  ?Down, Depressed, Hopeless 0 0 0  ?PHQ - 2 Score 0 0 0  ?Altered sleeping 0 2 0  ?Tired, decreased energy 0 2 0  ?Change in appetite 0 1 0  ?Feeling bad or failure about yourself  0 0 0  ?Trouble concentrating 0 2 0  ?Moving slowly or fidgety/restless 0 0 0  ?Suicidal thoughts 0 0 0  ?PHQ-9 Score 0 7 0  ?Difficult doing work/chores  Not difficult at all Not difficult at all  ? ? ? ? ?10. Hypokalemia ?No muscle cramps ? ?11. Primary insomnia ?Is on trazdone to sleep. Some nights she sleeps well and other nights she does not. ? ?12. SYNCOPE ?No  since seeing cardilogy ? ?13. Chronic pain syndrome ?  See pain management monthly ?Pain assessment: ?Cause of pain- back and hip- has had back surgery X3  ?Pain location- back and hip ?Pain on scale of 1-10- 7-8/10 ?Frequency- daily ?What increases pain-to much activity ?What makes pain Better-rest ?Effects on ADL - none ?Any change in general medical condition-none ? ?Current opioids rx- norco 7.5325 ?# meds rx- 52 ?Effectiveness of current meds-helps ?Adverse reactions from pain meds-none ?Morphine equivalent- 22.5 ? ?Pill count performed-No ?Last drug screen - 09/28/20 ?( high risk q83m moderate risk q618mlow risk yearly ) ?Urine drug screen today- No ?[place]- yes ? If yes were their any concerning findings? - no ? ? ?Overdose risk: 1 ? ? ?Pain contract signed on:04/09/21 ? ? ?14. Overweight (BMI 25.0-29.9) ?No recent weight changes ?Wt Readings from Last 3 Encounters:  ?06/11/21  120 lb (54.4 kg)  ?06/07/21 120 lb 14.4 oz (54.8 kg)  ?05/07/21 122 lb 9.6 oz (55.6 kg)  ? ?BMI Readings from Last 3 Encounters:  ?06/11/21 21.95 kg/m?  ?06/07/21 22.11 kg/m?  ?05/07/21 22.42 kg/m?  ? ? ? ? ?New complaints: ?Thinksshe may have a uti and/or yeast infection. Has had urinary frequency for several days, with slight burning ? ?Allergies  ?Allergen Reactions  ? Codeine Other (See Comments)  ?  "I will have a heart attack."  ? Morphine And Related Other (See Comments)  ?  "It will cause me to have a heart attack."  ? Ambien [Zolpidem Tartrate] Nausea And Vomiting  ? Clonidine Derivatives Nausea And Vomiting  ?  gerd - caused acid reflux per pt  ? Metformin And Related Nausea And Vomiting  ?  cramping from metformin  ? Lyrica [Pregabalin] Swelling and Other (See Comments)  ?  Weight gain  ? Neurontin [Gabapentin] Other (See Comments)  ?  Causes elevated LFTs ?  ? ?Outpatient Encounter Medications as of 07/01/2021  ?Medication Sig  ? Accu-Chek Softclix Lancets lancets Test BS up to 4 times a day Dx E11.9  ? acyclovir ointment (ZOVIRAX) 5 % APPLY TOPICALLY EVERY 3 HOURS. (Patient taking differently: Apply 1 application. topically daily as needed (outbreaks).)  ? albuterol (VENTOLIN HFA) 108 (90 Base) MCG/ACT inhaler INHALE 2 PUFFS EVERY 6 HOURS AS NEEDED FOR SHORTNESS OF BREATH AND WHEEZING.  ? Alcohol Swabs (B-D SINGLE USE SWABS REGULAR) PADS USE 1 PAD DAILY WHEN CHECKING BLOOD SUGAR. R73.03  ? aspirin EC 81 MG EC tablet Take 1 tablet (81 mg total) by mouth daily. Swallow whole.  ? blood glucose meter kit and supplies Dispense based on patient and insurance preference. Use up to four times daily as directed. (FOR ICD-10 E10.9, E11.9).  ? busPIRone (BUSPAR) 10 MG tablet Take 1 tablet (10 mg total) by mouth 2 (two) times daily as needed. (Patient taking differently: Take 10 mg by mouth 2 (two) times daily.)  ? butalbital-acetaminophen-caffeine (FIORICET) 50-325-40 MG tablet TAKE 1 TABLET BY MOUTH EVERY 6 HOURS  AS NEEDED FOR HEADACHE.  ? carvedilol (COREG) 3.125 MG tablet Take 0.5 tablets (1.5625 mg total) by mouth 2 (two) times daily with a meal. BP over 150 before taken (Patient taking differently: Take 3.125 mg by mouth daily as needed (blood pressure over 150). BP over 150 before taken)  ? celecoxib (CELEBREX) 200 MG capsule TAKE 1 CAPSULE BY MOUTH TWICE DAILY BETWEEN MEALS AS NEEDED.  ? dexlansoprazole (DEXILANT) 60 MG capsule TAKE 1 CAPSULE BY MOUTH ONCE DAILY.  ? dextromethorphan-guaiFENesin (MUCINEX DM) 30-600 MG 12hr tablet Take 1 tablet by mouth  2 (two) times daily. (Patient not taking: Reported on 06/05/2021)  ? diclofenac Sodium (VOLTAREN) 1 % GEL APPLY 4 GRAMS TOPICALLY 4 TIMES A DAY. (Patient taking differently: Apply 4 g topically 4 (four) times daily as needed (pain).)  ? docusate sodium (COLACE) 100 MG capsule Take 1 capsule (100 mg total) by mouth 2 (two) times daily.  ? DULoxetine (CYMBALTA) 60 MG capsule TAKE 1 CAPSULE BY MOUTH AT BEDTIME.  ? empagliflozin (JARDIANCE) 25 MG TABS tablet Take 1 tablet (25 mg total) by mouth daily before breakfast.  ? escitalopram (LEXAPRO) 20 MG tablet TAKE 1 TABLET ONCE DAILY.  ? estradiol (ESTRACE) 0.1 MG/GM vaginal cream USE ONE APPLICATORFUL IN THE VAGINA AT BEDTIME AS DIRECTED  ? FIBER PO Take 1 tablet by mouth in the morning and at bedtime.  ? fludrocortisone (FLORINEF) 0.1 MG tablet TAKE 1 TABLET EVERY OTHER DAY.  ? fluticasone (FLONASE) 50 MCG/ACT nasal spray USE 1 SPRAY IN EACH NOSTRIL ONCE DAILY. (Patient taking differently: Place 1 spray into both nostrils daily as needed for allergies.)  ? fluticasone furoate-vilanterol (BREO ELLIPTA) 100-25 MCG/ACT AEPB Inhale 1 puff into the lungs daily. (Patient not taking: Reported on 06/05/2021)  ? fluticasone furoate-vilanterol (BREO ELLIPTA) 200-25 MCG/ACT AEPB Inhale 1 puff into the lungs daily.  ? furosemide (LASIX) 40 MG tablet TAKE (1) TABLET DAILY AS NEEDED FOR (EXCESSIVE) FLUID.  ? glucose blood (ACCU-CHEK GUIDE) test  strip Test BS up to 4 times a day Dx E11.9  ? hydrALAZINE (APRESOLINE) 25 MG tablet TAKE 1 TABLET BY MOUTH 3 TIMES DAILY AS NEEDED (FOR SEVERE HYPERTENSION/SYSTOLIC NUMBER 449 OR GREATER).  ? Ibuprofen-diphe

## 2021-07-02 ENCOUNTER — Ambulatory Visit: Payer: 59 | Admitting: Nurse Practitioner

## 2021-07-02 LAB — CMP14+EGFR
ALT: 26 IU/L (ref 0–32)
AST: 30 IU/L (ref 0–40)
Albumin/Globulin Ratio: 1.8 (ref 1.2–2.2)
Albumin: 4.4 g/dL (ref 3.8–4.8)
Alkaline Phosphatase: 121 IU/L (ref 44–121)
BUN/Creatinine Ratio: 18 (ref 12–28)
BUN: 11 mg/dL (ref 8–27)
Bilirubin Total: 0.2 mg/dL (ref 0.0–1.2)
CO2: 24 mmol/L (ref 20–29)
Calcium: 9.8 mg/dL (ref 8.7–10.3)
Chloride: 103 mmol/L (ref 96–106)
Creatinine, Ser: 0.61 mg/dL (ref 0.57–1.00)
Globulin, Total: 2.4 g/dL (ref 1.5–4.5)
Glucose: 82 mg/dL (ref 70–99)
Potassium: 4.5 mmol/L (ref 3.5–5.2)
Sodium: 142 mmol/L (ref 134–144)
Total Protein: 6.8 g/dL (ref 6.0–8.5)
eGFR: 100 mL/min/{1.73_m2} (ref >59)

## 2021-07-02 LAB — LIPID PANEL
Chol/HDL Ratio: 2.4 ratio (ref 0.0–4.4)
Cholesterol, Total: 152 mg/dL (ref 100–199)
HDL: 63 mg/dL (ref >39)
LDL Chol Calc (NIH): 73 mg/dL (ref 0–99)
Triglycerides: 83 mg/dL (ref 0–149)
VLDL Cholesterol Cal: 16 mg/dL (ref 5–40)

## 2021-07-02 LAB — CBC WITH DIFFERENTIAL/PLATELET
Basophils Absolute: 0 10*3/uL (ref 0.0–0.2)
Basos: 0 %
EOS (ABSOLUTE): 0.1 10*3/uL (ref 0.0–0.4)
Eos: 1 %
Hematocrit: 45 % (ref 34.0–46.6)
Hemoglobin: 15 g/dL (ref 11.1–15.9)
Immature Grans (Abs): 0 10*3/uL (ref 0.0–0.1)
Immature Granulocytes: 0 %
Lymphocytes Absolute: 2.7 10*3/uL (ref 0.7–3.1)
Lymphs: 37 %
MCH: 32 pg (ref 26.6–33.0)
MCHC: 33.3 g/dL (ref 31.5–35.7)
MCV: 96 fL (ref 79–97)
Monocytes Absolute: 0.5 10*3/uL (ref 0.1–0.9)
Monocytes: 7 %
Neutrophils Absolute: 3.9 10*3/uL (ref 1.4–7.0)
Neutrophils: 55 %
Platelets: 233 10*3/uL (ref 150–450)
RBC: 4.69 x10E6/uL (ref 3.77–5.28)
RDW: 12.6 % (ref 11.7–15.4)
WBC: 7.2 10*3/uL (ref 3.4–10.8)

## 2021-07-30 ENCOUNTER — Other Ambulatory Visit: Payer: Self-pay | Admitting: Nurse Practitioner

## 2021-08-02 ENCOUNTER — Ambulatory Visit (INDEPENDENT_AMBULATORY_CARE_PROVIDER_SITE_OTHER): Payer: 59 | Admitting: Orthopedic Surgery

## 2021-08-02 ENCOUNTER — Ambulatory Visit (INDEPENDENT_AMBULATORY_CARE_PROVIDER_SITE_OTHER): Payer: 59

## 2021-08-02 DIAGNOSIS — Z96611 Presence of right artificial shoulder joint: Secondary | ICD-10-CM | POA: Diagnosis not present

## 2021-08-02 MED ORDER — METHOCARBAMOL 750 MG PO TABS: 750.0000 mg | ORAL_TABLET | Freq: Two times a day (BID) | ORAL | 0 refills | Status: DC | PRN

## 2021-08-04 ENCOUNTER — Encounter: Payer: Self-pay | Admitting: Orthopedic Surgery

## 2021-08-04 NOTE — Progress Notes (Signed)
? ?Post-Op Visit Note ?  ?Patient: [name]           ?Date of Birth: [birth year]           ?MRN: [id number] ?Visit Date: 08/02/2021 ?PCP: Bennie PieriniMartin, [name], FNP ? ? ?Assessment & Plan: ? ?Chief Complaint:  ?Chief Complaint  ?Patient presents with  ? Right Shoulder - Follow-up  ?   06/11/21 (7w 3d)RIGHT SHOULDER CONVERSION TOTAL SHOULDER ARTHROPLASTY to REVERSE TOTAL SHOULDER ARTHROPLASTY  ? ?  ? ?Visit Diagnoses:  ?1. History of arthroplasty of right shoulder   ? ? ?Plan: [name] is a 66 year old patient who underwent conversion of right shoulder replacement to reverse shoulder replacement 06/11/2021.  Doing well until 2 weeks ago.  States that the arm is gotten a little bit more painful.  She is here with her relative who is a massage therapist.  Reports some anterior pain but no fevers or chills.  Not in physical therapy.  Taking Norco.  On examination she actually has pretty good forward flexion and AB duction range of motion.  Incision itself is not warm.  Radiographs look good in terms of no change in alignment or prosthetic loosening.  No tenderness over the acromion.  Subscap strength is predictably weaker on the right compared to the left which is not unexpected.  Plan at this time is to refill Robaxin and 8-week return for clinical recheck. ? ?Follow-Up Instructions: No follow-ups on file.  ? ?Orders:  ?Orders Placed This Encounter  ?Procedures  ? XR Shoulder Right  ? ?Meds ordered this encounter  ?Medications  ? methocarbamol (ROBAXIN) 750 MG tablet  ?  Sig: Take 1 tablet (750 mg total) by mouth every 12 (twelve) hours as needed for muscle spasms.  ?  Dispense:  30 tablet  ?  Refill:  0  ? ? ?Imaging: ?No results found. ? ?PMFS History: ?Patient Active Problem List  ? Diagnosis Date Noted  ? Instability of prosthetic shoulder joint (HCC)   ? 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 Hypokalemia 11/25/2020  ? S/P laparoscopic fundoplication 10/31/2020  ? Status post total  replacement of left hip 01/31/2020  ? Unilateral primary osteoarthritis, left hip 12/21/2019  ? S/P shoulder replacement, right 11/15/2019  ? Overweight (BMI 25.0-29.9) 04/15/2019  ? Diverticulosis 04/14/2019  ? Chronic diastolic CHF (congestive heart failure) (HCC) 04/14/2019  ? Unstable angina (HCC) 10/20/2017  ? Chronic pain 09/23/2016  ? Pre-diabetes 09/02/2016  ? Recurrent major depressive disorder, in partial remission (HCC) 07/14/2016  ? Seizures (HCC) 07/14/2016  ? GAD (generalized anxiety disorder) 07/14/2016  ? Insomnia 09/05/2014  ? Allergic rhinitis 09/05/2014  ? Cervical spondylosis without myelopathy 12/19/2013  ?  Class: Chronic  ? Neural foraminal stenosis of cervical spine 12/19/2013  ? Cervical radiculitis 09/19/2013  ? Lumbosacral spondylosis without myelopathy 11/16/2012  ? Postlaminectomy syndrome, lumbar region 11/16/2012  ? Herpes simplex virus (HSV) infection 10/31/2008  ? Hyperlipidemia with target LDL less than 100 10/31/2008  ? Essential hypertension 10/31/2008  ? GERD 10/31/2008  ? SPINAL STENOSIS OF LUMBAR REGION 10/31/2008  ? Myalgia 10/31/2008  ? Osteoporosis 10/31/2008  ? SPONDYLOLISTHESIS 10/31/2008  ? SYNCOPE 10/31/2008  ? Sleep apnea 10/31/2008  ? ?Past Medical History:  ?Diagnosis Date  ? Allergy   ? Anemia   ? Anginal pain (HCC)   ? last time   ? Anxiety   ? 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 Asthma   ? Bipolar 1 disorder (HCC)   ? Blood  transfusion without reported diagnosis   ? Cataracts, bilateral 07/2017  ? CHF (congestive heart failure) (HCC)   ? COPD (chronic obstructive pulmonary disease) (HCC)   ? Coronary artery disease   ? reported hx of "MI";  Echo 2009 with normal LVF;  Myoview 05/2011: no ischemia  ? Depression   ? Diabetes mellitus without complication (HCC)   ? Dyslipidemia   ? Dysrhythmia   ? SVT  ? Esophageal stricture   ? Fibromyalgia   ? GERD (gastroesophageal reflux disease)   ? H/O hiatal hernia   ? Head injury, unspecified   ? Headache   ? migraines  ? Herpes  simplex infection   ? History of kidney stones   ? History of loop recorder   ? Managed by Dr. Sharrell Ku  ? Hyperlipidemia   ? Hypertension   ? Insomnia   ? Myocardial infarction [facility])   ? age 37  ? Osteoporosis   ? Pneumonia   ? hx  ? Seizures (HCC)   ? Shortness of breath   ? Sleep apnea   ? ? neg  ? Spinal stenosis of lumbar region   ? Spondylolisthesis   ? Status post placement of implantable loop recorder   ? Supraventricular tachycardia (HCC)   ? Syncope and collapse   ? s/p ILR; no arhythmogenic cause identified  ? UTI (lower urinary tract infection)   ?  ?Family History  ?Problem Relation Age of Onset  ? Heart attack Father   ? Mental illness Father   ? Mental illness Mother   ? Heart attack Brother   ?     stents  ? Alcohol abuse Brother   ? Heart disease Brother   ? Drug abuse Brother   ? Diabetes Brother   ? Colon cancer Maternal Aunt   ? Cirrhosis Brother   ? Stomach cancer Neg Hx   ? Esophageal cancer Neg Hx   ? Rectal cancer Neg Hx   ?  ?Past Surgical History:  ?Procedure Laterality Date  ? BACK SURGERY    ? BREAST SURGERY    ? lumpectomy  ? CARDIAC CATHETERIZATION  10/06/2011  ? CATARACT EXTRACTION W/PHACO Right 07/31/2017  ? Procedure: CATARACT EXTRACTION PHACO AND INTRAOCULAR LENS PLACEMENT (IOC);  Surgeon: Fabio Pierce, MD;  Location: [facility];  Service: Ophthalmology;  Laterality: Right;  CDE: 2.33  ? CATARACT EXTRACTION W/PHACO Left 08/14/2017  ? Procedure: CATARACT EXTRACTION PHACO AND INTRAOCULAR LENS PLACEMENT (IOC);  Surgeon: Fabio Pierce, MD;  Location: [facility];  Service: Ophthalmology;  Laterality: Left;  CDE: 2.74  ? CHOLECYSTECTOMY    ? CYSTOSCOPY    ? stone  ? DIAGNOSTIC LAPAROSCOPY    ? laparoscopic cholecystectomy  ? DOPPLER ECHOCARDIOGRAPHY  2009  ? ESOPHAGOGASTRODUODENOSCOPY N/A 10/31/2020  ? Procedure: ESOPHAGOGASTRODUODENOSCOPY (EGD);  Surgeon: Gaynelle Adu, MD;  Location: [facility];  Service: General;  Laterality: N/A;  ? EYE SURGERY Bilateral 08/14/2017  ? cataract removal  ?  head up tilt table testing  06/15/2007  ? Erica Bunting  ? HEMORRHOID SURGERY    ? HERNIA REPAIR    ? insertion of implatable loop recorder  08/11/2007  ? Erica Bunting  ? POSTERIOR CERVICAL FUSION/FORAMINOTOMY N/A 12/19/2013  ? Procedure: RIGHT C3-4.C4-5 AND C5-6 FORAMINOTOMIES;  Surgeon: Kerrin Champagne, MD;  Location: [facility] OR;  Service: Orthopedics;  Laterality: N/A;  ? TOTAL HIP ARTHROPLASTY Left 01/31/2020  ? Procedure: LEFT TOTAL HIP ARTHROPLASTY ANTERIOR APPROACH;  Surgeon: Kathryne Hitch, MD;  Location: [facility] OR;  Service: Orthopedics;  Laterality: Left;  ? TOTAL SHOULDER ARTHROPLASTY Right 11/15/2019  ? Procedure: RIGHT TOTAL SHOULDER ARTHROPLASTY;  Surgeon: Cammy Copa, MD;  Location: [facility];  Service: Orthopedics;  Laterality: Right;  ? TOTAL SHOULDER REVISION Right 06/11/2021  ? Procedure: RIGHT SHOULDER CONVERSION TOTAL SHOULDER ARTHROPLASTY to REVERSE TOTAL SHOULDER ARTHROPLASTY;  Surgeon: Cammy Copa, MD;  Location: [facility] OR;  Service: Orthopedics;  Laterality: Right;  ? TUBAL LIGATION    ? UPPER GASTROINTESTINAL ENDOSCOPY    ? XI ROBOTIC ASSISTED HIATAL HERNIA REPAIR N/A 10/31/2020  ? Procedure: XI ROBOTIC ASSISTED HIATAL HERNIA REPAIR WITH PARTIAL FUNDOPLICATION;  Surgeon: Gaynelle Adu, MD;  Location: [facility];  Service: General;  Laterality: N/A;  ? ?Social History  ? ?Occupational History  ? Occupation: Disability  ?  Comment: 15 years  ?Tobacco Use  ? Smoking status: Never  ? Smokeless tobacco: Never  ?Vaping Use  ? Vaping Use: Never used  ?Substance and Sexual Activity  ? Alcohol use: No  ?  Alcohol/week: 0.0 standard drinks  ? Drug use: No  ? Sexual activity: Yes  ? ? ? ?

## 2021-08-30 ENCOUNTER — Other Ambulatory Visit: Payer: Self-pay | Admitting: Cardiology

## 2021-08-30 ENCOUNTER — Other Ambulatory Visit: Payer: Self-pay | Admitting: Nurse Practitioner

## 2021-09-03 ENCOUNTER — Other Ambulatory Visit: Payer: Self-pay | Admitting: Nurse Practitioner

## 2021-09-03 DIAGNOSIS — B009 Herpesviral infection, unspecified: Secondary | ICD-10-CM

## 2021-09-05 ENCOUNTER — Ambulatory Visit: Payer: 59

## 2021-09-06 ENCOUNTER — Encounter: Payer: Self-pay | Admitting: Nurse Practitioner

## 2021-09-06 ENCOUNTER — Ambulatory Visit (INDEPENDENT_AMBULATORY_CARE_PROVIDER_SITE_OTHER): Payer: 59 | Admitting: Nurse Practitioner

## 2021-09-06 VITALS — BP 115/71 | HR 78 | Temp 99.8°F | Resp 20 | Ht 62.0 in | Wt 123.0 lb

## 2021-09-06 DIAGNOSIS — J029 Acute pharyngitis, unspecified: Secondary | ICD-10-CM

## 2021-09-06 DIAGNOSIS — J020 Streptococcal pharyngitis: Secondary | ICD-10-CM | POA: Diagnosis not present

## 2021-09-06 LAB — RAPID STREP SCREEN (MED CTR MEBANE ONLY): Strep Gp A Ag, IA W/Reflex: POSITIVE — AB

## 2021-09-06 MED ORDER — AMOXICILLIN 875 MG PO TABS: 875.0000 mg | ORAL_TABLET | Freq: Two times a day (BID) | ORAL | 0 refills | Status: DC

## 2021-09-06 NOTE — Patient Instructions (Addendum)
Strep Throat, Adult Strep throat is an infection of the throat. It is caused by germs (bacteria). Strep throat is common during the cold months of the year. It mostly affects children who are 5-65 years old. However, people of all ages can get it at any time of the year. This infection spreads from person to person through coughing, sneezing, or having close contact. What are the causes? This condition is caused by the Streptococcus pyogenes germ. What increases the risk? You care for young children. Children are more likely to get strep throat and may spread it to others. You go to crowded places. Germs can spread easily in such places. You kiss or touch someone who has strep throat. What are the signs or symptoms? Fever or chills. Redness, swelling, or pain in the tonsils or throat. Pain or trouble when swallowing. White or yellow spots on the tonsils or throat. Tender glands in the neck and under the jaw. Bad breath. Red rash all over the body. This is rare. How is this treated? Medicines that kill germs (antibiotics). Medicines that treat pain or fever. These include: Ibuprofen or acetaminophen. Aspirin, only for people who are over the age of 18. Cough drops. Throat sprays. Follow these instructions at home: Medicines  Take over-the-counter and prescription medicines only as told by your doctor. Take your antibiotic medicine as told by your doctor. Do not stop taking the antibiotic even if you start to feel better. Eating and drinking  If you have trouble swallowing, eat soft foods until your throat feels better. Drink enough fluid to keep your pee (urine) pale yellow. To help with pain, you may have: Warm fluids, such as soup and tea. Cold fluids, such as frozen desserts or popsicles. General instructions Rinse your mouth (gargle) with a salt-water mixture 3-4 times a day or as needed. To make a salt-water mixture, dissolve -1 tsp (3-6 g) of salt in 1 cup (237 mL) of warm  water. Rest as much as you can. Stay home from work or school until you have been taking antibiotics for 24 hours. Do not smoke or use any products that contain nicotine or tobacco. If you need help quitting, ask your doctor. Keep all follow-up visits. How is this prevented?  Do not share food, drinking cups, or personal items. They can cause the germs to spread. Wash your hands well with soap and water. Make sure that all people in your house wash their hands well. Have family members tested if they have a fever or a sore throat. They may need an antibiotic if they have strep throat. Contact a doctor if: You have swelling in your neck that keeps getting bigger. You get a rash, cough, or earache. You cough up a thick fluid that is green, yellow-brown, or bloody. You have pain that does not get better with medicine. Your symptoms get worse instead of getting better. You have a fever. Get help right away if: You vomit. You have a very bad headache. Your neck hurts or feels stiff. You have chest pain or are short of breath. You have drooling, very bad throat pain, or changes in your voice. Your neck is swollen, or the skin gets red and tender. Your mouth is dry, or you are peeing less than normal. You keep feeling more tired or have trouble waking up. Your joints are red or painful. These symptoms may be an emergency. Do not wait to see if the symptoms will go away. Get help right away. Call   your local emergency services (911 in the [place] Summary Strep throat is an infection of the throat. It is caused by germs (bacteria). This infection can spread from person to person through coughing, sneezing, or having close contact. Take your medicines, including antibiotics, as told by your doctor. Do not stop taking the antibiotic even if you start to feel better. To prevent the spread of germs, wash your hands well with soap and water. Have others do the same. Do not share food, drinking cups,  or personal items. Get help right away if you have a bad headache, chest pain, shortness of breath, a stiff or painful neck, or you vomit. This information is not intended to replace advice given to you by your health care provider. Make sure you discuss any questions you have with your health care provider. Document Revised: 07/10/2020 Document Reviewed: 07/10/2020 Elsevier Patient Education  2023 Elsevier Inc.  

## 2021-09-06 NOTE — Progress Notes (Signed)
   Subjective:    Patient ID: [name], female    DOB: Sep 03, 1955, 66 y.o.   MRN: [id number]   Chief Complaint: Headache and Sore Throat (Difficulty swallowing/)   Headache  Associated symptoms include a fever (?), rhinorrhea and a sore throat. Pertinent negatives include no coughing or sinus pressure.  Sore Throat  Associated symptoms include congestion, headaches and trouble swallowing. Pertinent negatives include no coughing.   Patient comes in today c/o headache and sore throat. Started about 4 days ago. Painful to swallow. No fever. Slight congestion.    Review of Systems  Constitutional:  Positive for fever (?). Negative for chills.  HENT:  Positive for congestion, rhinorrhea, sore throat, trouble swallowing and voice change. Negative for sinus pressure and sinus pain.   Respiratory:  Negative for cough.   Neurological:  Positive for headaches.  All other systems reviewed and are negative.      Objective:   Physical Exam Vitals reviewed.  Constitutional:      Appearance: She is well-developed.  HENT:     Right Ear: Tympanic membrane normal.     Left Ear: Tympanic membrane normal.     Nose: Congestion and rhinorrhea present.     Mouth/Throat:     Pharynx: Posterior oropharyngeal erythema present. No oropharyngeal exudate.  Cardiovascular:     Rate and Rhythm: Normal rate and regular rhythm.     Heart sounds: Normal heart sounds.  Pulmonary:     Breath sounds: Normal breath sounds.  Lymphadenopathy:     Cervical: Cervical adenopathy (anterior cervical lymphadenopathy) present.  Skin:    General: Skin is warm.  Neurological:     General: No focal deficit present.     Mental Status: She is alert.  Psychiatric:        Mood and Affect: Mood normal.        Behavior: Behavior normal.    BP 115/71   Pulse 78   Temp 99.8 F (37.7 C) (Temporal)   Resp 20   Ht 5\' 2"  (1.575 m)   Wt 123 lb (55.8 kg)   SpO2 98%   BMI 22.50 kg/m         Assessment &  Plan:   [name] in today with chief complaint of Headache and Sore Throat (Difficulty swallowing/)   1. Sore throat - Rapid Strep Screen ([facility] ONLY)  2. Pharyngitis due to Streptococcus species Force fluids Motrin or tylenol OTC OTC decongestant Throat lozenges if help New toothbrush in 3 days  Meds ordered this encounter  Medications   amoxicillin (AMOXIL) 875 MG tablet    Sig: Take 1 tablet (875 mg total) by mouth 2 (two) times daily. 1 po BID    Dispense:  20 tablet    Refill:  0    Order Specific Question:   Supervising Provider    Answer:   Caryl Pina A [id number]       The above assessment and management plan was discussed with the patient. The patient verbalized understanding of and has agreed to the management plan. Patient is aware to call the clinic if symptoms persist or worsen. Patient is aware when to return to the clinic for a follow-up visit. Patient educated on when it is appropriate to go to the emergency department.   Erica Hassell Done, FNP

## 2021-09-09 ENCOUNTER — Ambulatory Visit (INDEPENDENT_AMBULATORY_CARE_PROVIDER_SITE_OTHER): Payer: 59

## 2021-09-09 VITALS — Ht 62.0 in | Wt 123.0 lb

## 2021-09-09 DIAGNOSIS — Z1231 Encounter for screening mammogram for malignant neoplasm of breast: Secondary | ICD-10-CM | POA: Diagnosis not present

## 2021-09-09 DIAGNOSIS — Z0000 Encounter for general adult medical examination without abnormal findings: Secondary | ICD-10-CM | POA: Diagnosis not present

## 2021-09-09 NOTE — Patient Instructions (Signed)
[name] , Thank you for taking time to come for your Medicare Wellness Visit. I appreciate your ongoing commitment to your health goals. Please review the following plan we discussed and let me know if I can assist you in the future.   Screening recommendations/referrals: Colonoscopy: Done 04/26/2012 Repeat in 10 years  Mammogram: Order placed today. Bone Density: Done 08/24/2020 Repeat every 2 years  Recommended yearly ophthalmology/optometry visit for glaucoma screening and checkup Recommended yearly dental visit for hygiene and checkup  Vaccinations: Influenza vaccine: Done 01/02/2021 Repeat annually  Pneumococcal vaccine: Done 01/05/2014 and 01/31/2012  Tdap vaccine: Done 08/05/2011 Repeat in 10 years  Shingles vaccine: Done 01/02/2012 and 07/01/2021. Second dose of Shingrix due after 08/31/2021.  Covid-19: Done 06/21/20 and 10/08/2019.  Advanced directives: Please bring a copy of your health care power of attorney and living will to the office to be added to your chart at your convenience.   Conditions/risks identified: Aim for 6-8 glasses of water, and 5 servings of fruits and vegetables each day. Chair exercises are a good option. [employer] on [employer].  Next appointment: Follow up in one year for your annual wellness visit. 2024.  Preventive Care 40-64 Years, Female Preventive care refers to lifestyle choices and visits with your health care provider that can promote health and wellness. What does preventive care include? A yearly physical exam. This is also called an annual well check. Dental exams once or twice a year. Routine eye exams. Ask your health care provider how often you should have your eyes checked. Personal lifestyle choices, including: Daily care of your teeth and gums. Regular physical activity. Eating a healthy diet. Avoiding tobacco and drug use. Limiting alcohol use. Practicing safe sex. Taking low-dose aspirin daily starting at age 56. Taking vitamin  and mineral supplements as recommended by your health care provider. What happens during an annual well check? The services and screenings done by your health care provider during your annual well check will depend on your age, overall health, lifestyle risk factors, and family history of disease. Counseling  Your health care provider may ask you questions about your: Alcohol use. Tobacco use. Drug use. Emotional well-being. Home and relationship well-being. Sexual activity. Eating habits. Work and work Statistician. Method of birth control. Menstrual cycle. Pregnancy history. Screening  You may have the following tests or measurements: Height, weight, and BMI. Blood pressure. Lipid and cholesterol levels. These may be checked every 5 years, or more frequently if you are over 30 years old. Skin check. Lung cancer screening. You may have this screening every year starting at age 31 if you have a 30-pack-year history of smoking and currently smoke or have quit within the past 15 years. Fecal occult blood test (FOBT) of the stool. You may have this test every year starting at age 77. Flexible sigmoidoscopy or colonoscopy. You may have a sigmoidoscopy every 5 years or a colonoscopy every 10 years starting at age 85. Hepatitis C blood test. Hepatitis B blood test. Sexually transmitted disease (STD) testing. Diabetes screening. This is done by checking your blood sugar (glucose) after you have not eaten for a while (fasting). You may have this done every 1-3 years. Mammogram. This may be done every 1-2 years. Talk to your health care provider about when you should start having regular mammograms. This may depend on whether you have a family history of breast cancer. BRCA-related cancer screening. This may be done if you have a family history of breast, ovarian, tubal,  or peritoneal cancers. Pelvic exam and Pap test. This may be done every 3 years starting at age 77. Starting at age 4, this  may be done every 5 years if you have a Pap test in combination with an HPV test. Bone density scan. This is done to screen for osteoporosis. You may have this scan if you are at high risk for osteoporosis. Discuss your test results, treatment options, and if necessary, the need for more tests with your health care provider. Vaccines  Your health care provider may recommend certain vaccines, such as: Influenza vaccine. This is recommended every year. Tetanus, diphtheria, and acellular pertussis (Tdap, Td) vaccine. You may need a Td booster every 10 years. Zoster vaccine. You may need this after age 58. Pneumococcal 13-valent conjugate (PCV13) vaccine. You may need this if you have certain conditions and were not previously vaccinated. Pneumococcal polysaccharide (PPSV23) vaccine. You may need one or two doses if you smoke cigarettes or if you have certain conditions. Talk to your health care provider about which screenings and vaccines you need and how often you need them. This information is not intended to replace advice given to you by your health care provider. Make sure you discuss any questions you have with your health care provider. Document Released: 04/13/2015 Document Revised: 12/05/2015 Document Reviewed: 01/16/2015 Elsevier Interactive Patient Education  2017 [place] Prevention in the Home Falls can cause injuries. They can happen to people of all ages. There are many things you can do to make your home safe and to help prevent falls. What can I do on the outside of my home? Regularly fix the edges of walkways and driveways and fix any cracks. Remove anything that might make you trip as you walk through a door, such as a raised step or threshold. Trim any bushes or trees on the path to your home. Use bright outdoor lighting. Clear any walking paths of anything that might make someone trip, such as rocks or tools. Regularly check to see if handrails are loose or  broken. Make sure that both sides of any steps have handrails. Any raised decks and porches should have guardrails on the edges. Have any leaves, snow, or ice cleared regularly. Use sand or salt on walking paths during winter. Clean up any spills in your garage right away. This includes oil or grease spills. What can I do in the bathroom? Use night lights. Install grab bars by the toilet and in the tub and shower. Do not use towel bars as grab bars. Use non-skid mats or decals in the tub or shower. If you need to sit down in the shower, use a plastic, non-slip stool. Keep the floor dry. Clean up any water that spills on the floor as soon as it happens. Remove soap buildup in the tub or shower regularly. Attach bath mats securely with double-sided non-slip rug tape. Do not have throw rugs and other things on the floor that can make you trip. What can I do in the bedroom? Use night lights. Make sure that you have a light by your bed that is easy to reach. Do not use any sheets or blankets that are too big for your bed. They should not hang down onto the floor. Have a firm chair that has side arms. You can use this for support while you get dressed. Do not have throw rugs and other things on the floor that can make you trip. What can I do  in the kitchen? Clean up any spills right away. Avoid walking on wet floors. Keep items that you use a lot in easy-to-reach places. If you need to reach something above you, use a strong step stool that has a grab bar. Keep electrical cords out of the way. Do not use floor polish or wax that makes floors slippery. If you must use wax, use non-skid floor wax. Do not have throw rugs and other things on the floor that can make you trip. What can I do with my stairs? Do not leave any items on the stairs. Make sure that there are handrails on both sides of the stairs and use them. Fix handrails that are broken or loose. Make sure that handrails are as long as  the stairways. Check any carpeting to make sure that it is firmly attached to the stairs. Fix any carpet that is loose or worn. Avoid having throw rugs at the top or bottom of the stairs. If you do have throw rugs, attach them to the floor with carpet tape. Make sure that you have a light switch at the top of the stairs and the bottom of the stairs. If you do not have them, ask someone to add them for you. What else can I do to help prevent falls? Wear shoes that: Do not have high heels. Have rubber bottoms. Are comfortable and fit you well. Are closed at the toe. Do not wear sandals. If you use a stepladder: Make sure that it is fully opened. Do not climb a closed stepladder. Make sure that both sides of the stepladder are locked into place. Ask someone to hold it for you, if possible. Clearly mark and make sure that you can see: Any grab bars or handrails. First and last steps. Where the edge of each step is. Use tools that help you move around (mobility aids) if they are needed. These include: Canes. Walkers. Scooters. Crutches. Turn on the lights when you go into a dark area. Replace any light bulbs as soon as they burn out. Set up your furniture so you have a clear path. Avoid moving your furniture around. If any of your floors are uneven, fix them. If there are any pets around you, be aware of where they are. Review your medicines with your doctor. Some medicines can make you feel dizzy. This can increase your chance of falling. Ask your doctor what other things that you can do to help prevent falls. This information is not intended to replace advice given to you by your health care provider. Make sure you discuss any questions you have with your health care provider. Document Released: 01/11/2009 Document Revised: 08/23/2015 Document Reviewed: 04/21/2014 [employer] Interactive Patient Education  2017 [employer]  

## 2021-09-09 NOTE — Progress Notes (Signed)
Subjective:   [name] is a 66 y.o. female who presents for Medicare Annual (Subsequent) preventive examination. Virtual Visit via Telephone Note  I connected with  [name] on 09/09/21 at  9:00 AM EDT by telephone and verified that I am speaking with the correct person using two identifiers.  Location: Patient:HOME Provider: [facility] Persons participating in the virtual visit: patient/Nurse Health Advisor   I discussed the limitations, risks, security and privacy concerns of performing an evaluation and management service by telephone and the availability of in person appointments. The patient expressed understanding and agreed to proceed.  Interactive audio and video telecommunications were attempted between this nurse and patient, however failed, due to patient having technical difficulties OR patient did not have access to video capability.  We continued and completed visit with audio only.  Some vital signs may be absent or patient reported.   Chriss Driver, LPN  Review of Systems     Cardiac Risk Factors include: hypertension;dyslipidemia;sedentary lifestyle;Other (see comment), Risk factor comments: CHF, Spondyolisthsis, Hip replacement, Seizures.     Objective:    Today's Vitals   09/09/21 0902 09/09/21 0903  Weight: 123 lb (55.8 kg)   Height: 5' 2"  (1.575 m)   PainSc:  7    Body mass index is 22.5 kg/m.     09/09/2021    9:09 AM 06/07/2021   11:20 AM 05/07/2021   10:22 AM 11/25/2020    7:54 PM 11/12/2020    3:46 PM 10/31/2020    1:39 PM 10/11/2020   10:25 AM  Advanced Directives  Does Patient Have a Medical Advance Directive? Yes No No No No No No  Type of Paramedic of [place];Living will        Copy of [place] in Chart? No - copy requested        Would patient like information on creating a medical advance directive?  No - Patient declined Yes (MAU/Ambulatory/Procedural Areas - Information given) No  - Patient declined No - Patient declined No - Patient declined     Current Medications (verified) Outpatient Encounter Medications as of 09/09/2021  Medication Sig   Accu-Chek Softclix Lancets lancets USE TO TEST BLOOD SUGAR UP TO 4 TIMES A DAY AS DIRECTED. R73.03   acyclovir ointment (ZOVIRAX) 5 % APPLY TOPICALLY EVERY 3 HOURS.   albuterol (VENTOLIN HFA) 108 (90 Base) MCG/ACT inhaler INHALE 2 PUFFS EVERY 6 HOURS AS NEEDED FOR SHORTNESS OF BREATH AND WHEEZING.   Alcohol Swabs (B-D SINGLE USE SWABS REGULAR) PADS USE 1 PAD DAILY WHEN CHECKING BLOOD SUGAR. R73.03   amoxicillin (AMOXIL) 875 MG tablet Take 1 tablet (875 mg total) by mouth 2 (two) times daily. 1 po BID   aspirin EC 81 MG EC tablet Take 1 tablet (81 mg total) by mouth daily. Swallow whole.   blood glucose meter kit and supplies Dispense based on patient and insurance preference. Use up to four times daily as directed. (FOR ICD-10 E10.9, E11.9).   busPIRone (BUSPAR) 10 MG tablet Take 1 tablet (10 mg total) by mouth 2 (two) times daily as needed.   butalbital-acetaminophen-caffeine (FIORICET) 50-325-40 MG tablet TAKE 1 TABLET BY MOUTH EVERY 6 HOURS AS NEEDED FOR HEADACHE.   carvedilol (COREG) 3.125 MG tablet Take 0.5 tablets (1.5625 mg total) by mouth 2 (two) times daily with a meal. BP over 150 before taken   celecoxib (CELEBREX) 200 MG capsule TAKE 1 CAPSULE BY MOUTH TWICE DAILY BETWEEN MEALS  AS NEEDED.   dexlansoprazole (DEXILANT) 60 MG capsule Take 1 capsule (60 mg total) by mouth daily.   diclofenac Sodium (VOLTAREN) 1 % GEL APPLY 4 GRAMS TOPICALLY 4 TIMES A DAY. (Patient taking differently: Apply 4 g topically 4 (four) times daily as needed (pain).)   DULoxetine (CYMBALTA) 60 MG capsule Take 1 capsule (60 mg total) by mouth at bedtime.   empagliflozin (JARDIANCE) 25 MG TABS tablet Take 1 tablet (25 mg total) by mouth daily before breakfast.   escitalopram (LEXAPRO) 20 MG tablet Take 1 tablet (20 mg total) by mouth daily.    estradiol (ESTRACE) 0.1 MG/GM vaginal cream USE ONE APPLICATORFUL IN THE VAGINA AT BEDTIME AS DIRECTED   FIBER PO Take 1 tablet by mouth in the morning and at bedtime.   fludrocortisone (FLORINEF) 0.1 MG tablet TAKE 1 TABLET EVERY OTHER DAY.   fluticasone (FLONASE) 50 MCG/ACT nasal spray USE 1 SPRAY IN EACH NOSTRIL ONCE DAILY. (Patient taking differently: Place 1 spray into both nostrils daily as needed for allergies.)   fluticasone furoate-vilanterol (BREO ELLIPTA) 100-25 MCG/ACT AEPB Inhale 1 puff into the lungs daily.   furosemide (LASIX) 40 MG tablet TAKE (1) TABLET DAILY AS NEEDED FOR (EXCESSIVE) FLUID.   glucose blood (ACCU-CHEK GUIDE) test strip Test BS up to 4 times a day Dx E11.9   hydrALAZINE (APRESOLINE) 25 MG tablet TAKE 1 TABLET BY MOUTH 3 TIMES DAILY AS NEEDED (FOR SEVERE HYPERTENSION/SYSTOLIC NUMBER 350 OR GREATER).   HYDROcodone-acetaminophen (NORCO) 7.5-325 MG tablet Take 1 tablet by mouth in the morning, at noon, and at bedtime.   Ibuprofen-diphenhydrAMINE HCl (ADVIL PM) 200-25 MG CAPS Take 2 capsules by mouth at bedtime.   ipratropium (ATROVENT) 0.02 % nebulizer solution USE 1 VIAL (3ML) IN NEBULIZER EVERY 6 HOURS AS NEEDED FOR WHEEZING OR SHORTNESS OF BREATH.   isosorbide mononitrate (IMDUR) 30 MG 24 hr tablet Take 1.5 tablets (45 mg total) by mouth daily.   lamoTRIgine (LAMICTAL) 150 MG tablet Take 1 tablet (150 mg total) by mouth daily.   levETIRAcetam (KEPPRA) 500 MG tablet Take 1 tablet (500 mg total) by mouth 2 (two) times daily.   levocetirizine (XYZAL) 5 MG tablet TAKE 1 TABLET BY MOUTH EVERY MORNING.   lidocaine (LIDODERM) 5 % Place 1 patch onto the skin daily. Remove & Discard patch within 12 hours or as directed by MD (Patient taking differently: Place 1 patch onto the skin daily as needed (pain). Remove & Discard patch within 12 hours or as directed by MD)   lidocaine (XYLOCAINE) 5 % ointment APPLY TO AFFECTED AREA 3 TIMES A DAY AS NEEDED FOR MILD OR MODERATE PAIN.    methocarbamol (ROBAXIN) 500 MG tablet Take 1 tablet (500 mg total) by mouth every 8 (eight) hours as needed for muscle spasms.   methocarbamol (ROBAXIN) 750 MG tablet Take 1 tablet (750 mg total) by mouth every 12 (twelve) hours as needed for muscle spasms.   nitroGLYCERIN (NITROSTAT) 0.4 MG SL tablet PLACE ONE (1) TABLET UNDER TONGUE EVERY 5 MINUTES UP TO (3) DOSES AS NEEDED FOR CHEST PAIN.   ondansetron (ZOFRAN ODT) 4 MG disintegrating tablet Take 1 tablet (4 mg total) by mouth every 8 (eight) hours as needed for nausea or vomiting.   ondansetron (ZOFRAN) 4 MG tablet TAKE 1 TABLET BY MOUTH EVERY 8 HOURS AS NEEDED FOR NAUSEA AND VOMITING.   oxybutynin (DITROPAN-XL) 5 MG 24 hr tablet Take 1 tablet (5 mg total) by mouth at bedtime.   Probiotic Product (PROBIOTIC PO)  Take 1 capsule by mouth daily.   rosuvastatin (CRESTOR) 10 MG tablet Take 1 tablet (10 mg total) by mouth daily.   traZODone (DESYREL) 150 MG tablet Take 1 tablet (150 mg total) by mouth at bedtime.   valACYclovir (VALTREX) 1000 MG tablet TAKE 1 TABLET 3 TIMES A DAY. TO BE TAKEN ONLY WHEN SHE HAS OUT BREAK.   valACYclovir (VALTREX) 500 MG tablet Take 1 tablet (500 mg total) by mouth 2 (two) times daily.   No facility-administered encounter medications on file as of 09/09/2021.    Allergies (verified) Codeine, Morphine and related, Ambien [zolpidem tartrate], Clonidine derivatives, Metformin and related, Lyrica [pregabalin], and Neurontin [gabapentin]   History: Past Medical History:  Diagnosis Date   Allergy    Anemia    Anginal pain ([place])    last time    Anxiety    Arthritis    RHEUMATOID   Asthma    Bipolar 1 disorder ([place])    Blood transfusion without reported diagnosis    Cataracts, bilateral 07/2017   CHF (congestive heart failure) (HCC)    COPD (chronic obstructive pulmonary disease) ([place])    Coronary artery disease    reported hx of "MI";  Echo 2009 with normal LVF;  Myoview 05/2011: no ischemia   Depression     Diabetes mellitus without complication (HCC)    Dyslipidemia    Dysrhythmia    SVT   Esophageal stricture    Fibromyalgia    GERD (gastroesophageal reflux disease)    H/O hiatal hernia    Head injury, unspecified    Headache    migraines   Herpes simplex infection    History of kidney stones    History of loop recorder    Managed by Dr. Crissie Sickles   Hyperlipidemia    Hypertension    Insomnia    Myocardial infarction [facility])    age 58   Osteoporosis    Pneumonia    hx   Seizures ([place])    Shortness of breath    Sleep apnea    ? neg   Spinal stenosis of lumbar region    Spondylolisthesis    Status post placement of implantable loop recorder    Supraventricular tachycardia (HCC)    Syncope and collapse    s/p ILR; no arhythmogenic cause identified   UTI (lower urinary tract infection)    Past Surgical History:  Procedure Laterality Date   BACK SURGERY     BREAST SURGERY     lumpectomy   CARDIAC CATHETERIZATION  10/06/2011   CATARACT EXTRACTION W/PHACO Right 07/31/2017   Procedure: CATARACT EXTRACTION PHACO AND INTRAOCULAR LENS PLACEMENT ([place]);  Surgeon: [name], MD;  Location: [facility];  Service: Ophthalmology;  Laterality: Right;  CDE: 2.33   CATARACT EXTRACTION W/PHACO Left 08/14/2017   Procedure: CATARACT EXTRACTION PHACO AND INTRAOCULAR LENS PLACEMENT ([facility]);  Surgeon: [name], MD;  Location: [facility];  Service: Ophthalmology;  Laterality: Left;  CDE: 2.74   CHOLECYSTECTOMY     CYSTOSCOPY     stone   DIAGNOSTIC LAPAROSCOPY     laparoscopic cholecystectomy   DOPPLER ECHOCARDIOGRAPHY  2009   ESOPHAGOGASTRODUODENOSCOPY N/A 10/31/2020   Procedure: ESOPHAGOGASTRODUODENOSCOPY (EGD);  Surgeon: Greer Pickerel, MD;  Location: [facility];  Service: General;  Laterality: N/A;   EYE SURGERY Bilateral 08/14/2017   cataract removal   head up tilt table testing  06/15/2007   [name]   HEMORRHOID SURGERY     HERNIA REPAIR     insertion  of implatable loop recorder   08/11/2007   [name]   POSTERIOR CERVICAL FUSION/FORAMINOTOMY N/A 12/19/2013   Procedure: RIGHT C3-4.C4-5 AND C5-6 FORAMINOTOMIES;  Surgeon: [name], MD;  Location: [place];  Service: Orthopedics;  Laterality: N/A;   TOTAL HIP ARTHROPLASTY Left 01/31/2020   Procedure: LEFT TOTAL HIP ARTHROPLASTY ANTERIOR APPROACH;  Surgeon: Mcarthur Rossetti, MD;  Location: [place];  Service: Orthopedics;  Laterality: Left;   TOTAL SHOULDER ARTHROPLASTY Right 11/15/2019   Procedure: RIGHT TOTAL SHOULDER ARTHROPLASTY;  Surgeon: Meredith Pel, MD;  [facility];  Service: Orthopedics;  Laterality: Right;   TOTAL SHOULDER REVISION Right 06/11/2021   Procedure: RIGHT SHOULDER CONVERSION TOTAL SHOULDER ARTHROPLASTY to REVERSE TOTAL SHOULDER ARTHROPLASTY;  Surgeon: Meredith Pel, MD;  Location: [place];  Service: Orthopedics;  Laterality: Right;   TUBAL LIGATION     UPPER GASTROINTESTINAL ENDOSCOPY     XI ROBOTIC ASSISTED HIATAL HERNIA REPAIR N/A 10/31/2020   Procedure: XI ROBOTIC ASSISTED HIATAL HERNIA REPAIR WITH PARTIAL FUNDOPLICATION;  Surgeon: Greer Pickerel, MD;  Location[facility];  Service: General;  Laterality: N/A;   Family History  Problem Relation Age of Onset   Heart attack Father    Mental illness Father    Mental illness Mother    Heart attack Brother        stents   Alcohol abuse Brother    Heart disease Brother    Drug abuse Brother    Diabetes Brother    Colon cancer Maternal Aunt    Cirrhosis Brother    Stomach cancer Neg Hx    Esophageal cancer Neg Hx    Rectal cancer Neg Hx    Social History   Socioeconomic History   Marital status: Single    Spouse name: Not on file   Number of children: 5   Years of education: Not on file   Highest education level: 5th grade  Occupational History   Occupation: Disability    Comment: 15 years  Tobacco Use   Smoking status: Never   Smokeless tobacco: Never  Vaping Use   Vaping Use: Never used  Substance and Sexual  Activity   Alcohol use: No    Alcohol/week: 0.0 standard drinks of alcohol   Drug use: No   Sexual activity: Yes  Other Topics Concern   Not on file  Social History Narrative   Patient lives alone - 2 granddaughters live nearby - children are very involved   Patient is on disability.    Patient has 5th grade education.    Patient has 5 children, 24 grand-chilren, and 5 great-grand children.    Right handed    Social Determinants of Health   Financial Resource Strain: Low Risk  (09/09/2021)   Overall Financial Resource Strain (CARDIA)    Difficulty of Paying Living Expenses: Not hard at all  Food Insecurity: No Food Insecurity (09/09/2021)   Hunger Vital Sign    Worried About Running Out of Food in the Last Year: Never true    Ran Out of Food in the Last Year: Never true  Transportation Needs: No Transportation Needs (09/09/2021)   PRAPARE - Hydrologist (Medical): No    Lack of Transportation (Non-Medical): No  Physical Activity: Inactive (09/09/2021)   Exercise Vital Sign    Days of Exercise per Week: 0 days    Minutes of Exercise per Session: 0 min  Stress: No Stress Concern Present (09/09/2021)   [employer] of  Occupational Health - Occupational Stress Questionnaire    Feeling of Stress : Not at all  Social Connections: Moderately Integrated (09/09/2021)   Social Connection and Isolation Panel [NHANES]    Frequency of Communication with Friends and Family: More than three times a week    Frequency of Social Gatherings with Friends and Family: More than three times a week    Attends Religious Services: More than 4 times per year    Active Member of [employer] or Organizations: Yes    Attends Music therapist: More than 4 times per year    Marital Status: Never married    Tobacco Counseling Counseling given: Not Answered   Clinical Intake:  Pre-visit preparation completed: Yes  Pain : 0-10 Pain Score: 7  Pain Type: Chronic  pain Pain Location: Back Pain Descriptors / Indicators: Aching, Discomfort Pain Onset: More than a month ago Pain Frequency: Intermittent     BMI - recorded: 22.5 Nutritional Status: BMI of 19-24  Normal Nutritional Risks: None Diabetes: Yes  How often do you need to have someone help you when you read instructions, pamphlets, or other written materials from your doctor or pharmacy?: 1 - Never  Diabetic?Nutrition Risk Assessment:  Has the patient had any N/V/D within the last 2 months?  No  Does the patient have any non-healing wounds?  No  Has the patient had any unintentional weight loss or weight gain?  No   Diabetes:  Is the patient diabetic?  Yes  If diabetic, was a CBG obtained today?  No  Did the patient bring in their glucometer from home?  No  How often do you monitor your CBG's? TID.   Financial Strains and Diabetes Management:  Are you having any financial strains with the device, your supplies or your medication? No .  Does the patient want to be seen by Chronic Care Management for management of their diabetes?  No  Would the patient like to be referred to a Nutritionist or for Diabetic Management?  No   Diabetic Exams:  Diabetic Eye Exam: Completed 06/12/2021. Overdue for diabetic eye exam. Pt has been advised about the importance in completing this exam. A referral has been placed today. Message sent to referral coordinator for scheduling purposes. Advised pt to expect a call from office referred to regarding appt.  Diabetic Foot Exam: Completed DUE. Pt has been advised about the importance in completing this exam.  Interpreter Needed?: No  Information entered by :: mj Robi Mitter, lpn   Activities of Daily Living    09/09/2021    9:12 AM 06/07/2021   11:27 AM  In your present state of health, do you have any difficulty performing the following activities:  Hearing? 0   Vision? 0   Difficulty concentrating or making decisions? 0   Walking or climbing  stairs? 1   Dressing or bathing? 0   Doing errands, shopping? 0 0  Preparing Food and eating ? N   Using the Toilet? N   In the past six months, have you accidently leaked urine? Y   Comment Occasional   Do you have problems with loss of bowel control? N   Managing your Medications? N   Managing your Finances? N   Housekeeping or managing your Housekeeping? N     Patient Care Team: Chevis Pretty, FNP as PCP - General (Nurse Practitioner) Arnoldo Lenis, MD as PCP - Cardiology (Cardiology) [name], MD as Consulting Physician (Orthopedic Surgery) Evans Lance, MD  as Consulting Physician (Cardiology) Branch, Alphonse Guild, MD as Consulting Physician (Cardiology) Mcarthur Rossetti, MD as Consulting Physician (Orthopedic Surgery) [name], MD as Consulting Physician (Orthopedic Surgery) Lavera Guise, [facility] (Pharmacist)  Indicate any recent Medical Services you may have received from other than [facility] providers in the past year (date may be approximate).     Assessment:   This is a routine wellness examination for [facility].  Hearing/Vision screen Hearing Screening - Comments:: No hearing issues.  Vision Screening - Comments:: Glasses. [facility]. 06/12/2021.  Dietary issues and exercise activities discussed: Current Exercise Habits: The patient does not participate in regular exercise at present, Exercise limited by: cardiac condition(s);orthopedic condition(s);neurologic condition(s)   Goals Addressed             This Visit's Progress    Exercise 3x per week (30 min per time)   Not on track    Increase activity level as tolerated. Aim for 30 minutes of light walking 3 times a week. Walk with a partner.   Do chair exercises daily. Refer to handouts.      Have 3 meals a day   On track    Have 3 meals a day   On track    Eat 3 meals daily that consist protein, fruits and vegetables      Have 3 meals a day   On track    Eat 3 meals  daily that consist of proteins, fruits and vegetables       Depression Screen    09/09/2021    9:06 AM 07/01/2021    1:59 PM 04/02/2021   11:26 AM 01/14/2021   11:12 AM 01/01/2021    2:50 PM 12/14/2020   11:40 AM 10/10/2020    9:58 AM  PHQ 2/9 Scores  PHQ - 2 Score 0 0 0 0 0 0 0  PHQ- 9 Score  0 7 0 2 3     Fall Risk    09/09/2021    9:09 AM 07/01/2021    1:59 PM 01/01/2021    2:49 PM 10/10/2020    9:57 AM 09/28/2020    2:27 PM  [place] in the past year? 1 1 1  0 1  Number falls in past yr: 1 1 1  1   Injury with Fall? 1 1 1  1   Risk for fall due to : History of fall(s);Impaired balance/gait;Impaired mobility History of fall(s) History of fall(s)  History of fall(s)  Follow up Falls prevention discussed Falls evaluation completed Education provided  Education provided    [place]:  Any stairs in or around the home? Yes  If so, are there any without handrails? No  Home free of loose throw rugs in walkways, pet beds, electrical cords, etc? Yes  Adequate lighting in your home to reduce risk of falls? Yes   ASSISTIVE DEVICES UTILIZED TO PREVENT FALLS:  Life alert? No  Use of a cane, walker or w/c? Yes  Grab bars in the bathroom? Yes  Shower chair or bench in shower? Yes  Elevated toilet seat or a handicapped toilet? Yes   TIMED UP AND GO:  Was the test performed? No .  Phone visit.  Cognitive Function:    06/30/2017   10:15 AM 08/20/2016    2:04 PM  MMSE - Mini Mental State Exam  Orientation to time 4 4  Orientation to Place 5 5  Registration 3 3  Attention/ Calculation  0 5  Recall 1 0  Language- name 2 objects 2 2  Language- repeat 1 1  Language- follow 3 step command 3 3  Language- read & follow direction 1 1  Write a sentence 1 1  Copy design 1 1  Total score 22 26        09/09/2021    9:12 AM 09/04/2020   12:22 PM 08/01/2019    9:22 AM 07/29/2018   10:56 AM  6CIT Screen  What Year? 0 points 0 points 0 points 0 points   What month? 0 points 0 points 0 points 0 points  What time? 0 points 0 points 0 points 0 points  Count back from 20 0 points 0 points 0 points 0 points  Months in reverse 4 points 4 points  4 points  Repeat phrase 0 points 6 points 4 points 2 points  Total Score 4 points 10 points  6 points    Immunizations Immunization History  Administered Date(s) Administered   Influenza Split 06/11/2013   Influenza Whole 12/19/2011   Influenza,inj,Quad PF,6+ Mos 01/25/2013, 01/05/2014, 02/12/2016, 05/07/2017, 03/16/2018, 01/19/2019, 01/26/2020, 01/02/2021   Moderna Sars-Covid-2 Vaccination 10/08/2019, 06/21/2020   Pneumococcal Conjugate-13 01/05/2014   Pneumococcal Polysaccharide-23 11/30/2011, 01/31/2012   Tdap 08/05/2011   Zoster Recombinat (Shingrix) 07/01/2021   Zoster, Live 01/02/2012    TDAP status: Up to date  Flu Vaccine status: Up to date  Pneumococcal vaccine status: Up to date  Covid-19 vaccine status: Completed vaccines  Qualifies for Shingles Vaccine? Yes   Zostavax completed Yes   Shingrix Completed?: No.    Education has been provided regarding the importance of this vaccine. Patient has been advised to call insurance company to determine out of pocket expense if they have not yet received this vaccine. Advised may also receive vaccine at local pharmacy or Health Dept. Verbalized acceptance and understanding.  Screening Tests Health Maintenance  Topic Date Due   MAMMOGRAM  06/27/2015   COVID-19 Vaccine (3 - Moderna series) 09/25/2021 (Originally 08/16/2020)   Zoster Vaccines- Shingrix (2 of 2) 09/27/2021 (Originally 08/26/2021)   TETANUS/TDAP  12/27/2021 (Originally 08/04/2021)   INFLUENZA VACCINE  10/29/2021   COLONOSCOPY (Pts 45-78yr Insurance coverage will need to be confirmed)  04/26/2022   DEXA SCAN  08/25/2022   PAP SMEAR-Modifier  09/13/2023   Hepatitis C Screening  Completed   HIV Screening  Completed   HPV VACCINES  Aged Out    Health Maintenance  Health  Maintenance Due  Topic Date Due   MAMMOGRAM  06/27/2015    Colorectal cancer screening: Type of screening: Colonoscopy. Completed 04/26/2012. Repeat every 10 years  Mammogram status: Ordered 09/09/2021. Pt provided with contact info and advised to call to schedule appt.   Bone Density status: Completed 08/24/2020. Results reflect: Bone density results: OSTEOPOROSIS. Repeat every 2 years.  Lung Cancer Screening: (Low Dose CT Chest recommended if Age 65-80years, 30 pack-year currently smoking OR have quit w/in 15years.) does not qualify.  Additional Screening:  Hepatitis C Screening: does qualify; Completed 11/10/2014  Vision Screening: Recommended annual ophthalmology exams for early detection of glaucoma and other disorders of the eye. Is the patient up to date with their annual eye exam?  Yes  Who is the provider or what is the name of the office in which the patient attends annual eye exams? [facility] If pt is not established with a provider, would they like to be referred to a provider to establish care? No .  Dental Screening: Recommended annual dental exams for proper oral hygiene  Community Resource Referral / Chronic Care Management: CRR required this visit?  No   CCM required this visit?  No      Plan:     I have personally reviewed and noted the following in the patient's chart:   Medical and social history Use of alcohol, tobacco or illicit drugs  Current medications and supplements including opioid prescriptions.  Functional ability and status Nutritional status Physical activity Advanced directives List of other physicians Hospitalizations, surgeries, and ER visits in previous 12 months Vitals Screenings to include cognitive, depression, and falls Referrals and appointments  In addition, I have reviewed and discussed with patient certain preventive protocols, quality metrics, and best practice recommendations. A written personalized care plan for  preventive services as well as general preventive health recommendations were provided to patient.     Chriss Driver, LPN   4/31/5400   Nurse Notes: Discussed Mammogram and need for repeat. Pt agreeable to repeat this year. Order placed. Pt advised 2nd Shingrix vaccine is due this month. Pt verbalized understanding.

## 2021-09-12 ENCOUNTER — Encounter: Payer: Self-pay | Admitting: Nurse Practitioner

## 2021-09-12 ENCOUNTER — Encounter: Payer: Self-pay | Admitting: Cardiology

## 2021-09-12 ENCOUNTER — Ambulatory Visit (INDEPENDENT_AMBULATORY_CARE_PROVIDER_SITE_OTHER): Payer: 59 | Admitting: Cardiology

## 2021-09-12 ENCOUNTER — Ambulatory Visit (INDEPENDENT_AMBULATORY_CARE_PROVIDER_SITE_OTHER): Payer: 59 | Admitting: Nurse Practitioner

## 2021-09-12 VITALS — BP 110/70 | HR 68 | Ht 62.0 in | Wt 123.0 lb

## 2021-09-12 VITALS — BP 124/84 | HR 60 | Temp 97.8°F | Resp 20 | Ht 62.0 in | Wt 123.0 lb

## 2021-09-12 DIAGNOSIS — E782 Mixed hyperlipidemia: Secondary | ICD-10-CM | POA: Diagnosis not present

## 2021-09-12 DIAGNOSIS — M47817 Spondylosis without myelopathy or radiculopathy, lumbosacral region: Secondary | ICD-10-CM

## 2021-09-12 DIAGNOSIS — M4802 Spinal stenosis, cervical region: Secondary | ICD-10-CM

## 2021-09-12 DIAGNOSIS — G909 Disorder of the autonomic nervous system, unspecified: Secondary | ICD-10-CM | POA: Diagnosis not present

## 2021-09-12 DIAGNOSIS — R0789 Other chest pain: Secondary | ICD-10-CM

## 2021-09-12 DIAGNOSIS — R55 Syncope and collapse: Secondary | ICD-10-CM

## 2021-09-12 DIAGNOSIS — M47812 Spondylosis without myelopathy or radiculopathy, cervical region: Secondary | ICD-10-CM | POA: Diagnosis not present

## 2021-09-12 DIAGNOSIS — L989 Disorder of the skin and subcutaneous tissue, unspecified: Secondary | ICD-10-CM

## 2021-09-12 MED ORDER — HYDROCODONE-ACETAMINOPHEN 7.5-325 MG PO TABS: 1.0000 | ORAL_TABLET | Freq: Three times a day (TID) | ORAL | 0 refills | Status: AC

## 2021-09-12 MED ORDER — HYDROCODONE-ACETAMINOPHEN 7.5-325 MG PO TABS: 1.0000 | ORAL_TABLET | Freq: Three times a day (TID) | ORAL | 0 refills | Status: DC

## 2021-09-12 NOTE — Patient Instructions (Signed)

## 2021-09-12 NOTE — Progress Notes (Signed)
Subjective:    Patient ID: [name], female    DOB: 15-May-1955, 66 y.o.   MRN: [id number]   Chief Complaint: pain management   HPI:  [name] is a 66 y.o. who identifies as a female who was assigned female at birth.   Social history: Lives with: by herself Work history: disability   Comes in today for follow up of the following chronic medical issues:  1. Lumbosacral spondylosis without myelopathy 2. Cervical spondylosis without myelopathy 3. Neural foraminal stenosis of cervical spine Pain assessment: Cause of pain- as stated above Pain location- back and neck Pain on scale of 1-10- 7-8/10 Frequency- daily What increases pain-to much activity What makes pain Better-rest helps Effects on ADL - none Any change in general medical condition-none  Current opioids rx- norco 7.5/325 TID # meds rx- 90 Effectiveness of current meds-helps Adverse reactions from pain meds-none Morphine equivalent- 22.5MME  Pill count performed-No Last drug screen - 09/28/20 ( high risk q2m moderate risk q671mlow risk yearly ) Urine drug screen today- No Was the [place]- yes  If yes were their any concerning findings? - no   Overdose risk: 1    Pain contract signed on:04/09/21    New complaints: Multiple skin lesions that she wants checked out by dermatology  Allergies  Allergen Reactions   Codeine Other (See Comments)    "I will have a heart attack."   Morphine And Related Other (See Comments)    "It will cause me to have a heart attack."   Ambien [Zolpidem Tartrate] Nausea And Vomiting   Clonidine Derivatives Nausea And Vomiting    gerd - caused acid reflux per pt   Metformin And Related Nausea And Vomiting    cramping from metformin   Lyrica [Pregabalin] Swelling and Other (See Comments)    Weight gain   Neurontin [Gabapentin] Other (See Comments)    Causes elevated LFTs    Outpatient Encounter Medications as of 09/12/2021  Medication Sig    Accu-Chek Softclix Lancets lancets USE TO TEST BLOOD SUGAR UP TO 4 TIMES A DAY AS DIRECTED. R73.03   acyclovir ointment (ZOVIRAX) 5 % APPLY TOPICALLY EVERY 3 HOURS.   albuterol (VENTOLIN HFA) 108 (90 Base) MCG/ACT inhaler INHALE 2 PUFFS EVERY 6 HOURS AS NEEDED FOR SHORTNESS OF BREATH AND WHEEZING.   Alcohol Swabs (B-D SINGLE USE SWABS REGULAR) PADS USE 1 PAD DAILY WHEN CHECKING BLOOD SUGAR. R73.03   amoxicillin (AMOXIL) 875 MG tablet Take 1 tablet (875 mg total) by mouth 2 (two) times daily. 1 po BID   aspirin EC 81 MG EC tablet Take 1 tablet (81 mg total) by mouth daily. Swallow whole. (Patient not taking: Reported on 09/12/2021)   blood glucose meter kit and supplies Dispense based on patient and insurance preference. Use up to four times daily as directed. (FOR ICD-10 E10.9, E11.9).   busPIRone (BUSPAR) 10 MG tablet Take 1 tablet (10 mg total) by mouth 2 (two) times daily as needed.   butalbital-acetaminophen-caffeine (FIORICET) 50-325-40 MG tablet TAKE 1 TABLET BY MOUTH EVERY 6 HOURS AS NEEDED FOR HEADACHE.   carvedilol (COREG) 3.125 MG tablet Take 0.5 tablets (1.5625 mg total) by mouth 2 (two) times daily with a meal. BP over 150 before taken   celecoxib (CELEBREX) 200 MG capsule TAKE 1 CAPSULE BY MOUTH TWICE DAILY BETWEEN MEALS AS NEEDED.   dexlansoprazole (DEXILANT) 60 MG capsule Take 1 capsule (60 mg total) by mouth daily.   diclofenac Sodium (VOLTAREN) 1 %  GEL APPLY 4 GRAMS TOPICALLY 4 TIMES A DAY. (Patient taking differently: Apply 4 g topically 4 (four) times daily as needed (pain).)   DULoxetine (CYMBALTA) 60 MG capsule Take 1 capsule (60 mg total) by mouth at bedtime.   empagliflozin (JARDIANCE) 25 MG TABS tablet Take 1 tablet (25 mg total) by mouth daily before breakfast.   escitalopram (LEXAPRO) 20 MG tablet Take 1 tablet (20 mg total) by mouth daily.   estradiol (ESTRACE) 0.1 MG/GM vaginal cream USE ONE APPLICATORFUL IN THE VAGINA AT BEDTIME AS DIRECTED   FIBER PO Take 1 tablet by  mouth in the morning and at bedtime.   fludrocortisone (FLORINEF) 0.1 MG tablet TAKE 1 TABLET EVERY OTHER DAY.   fluticasone (FLONASE) 50 MCG/ACT nasal spray USE 1 SPRAY IN EACH NOSTRIL ONCE DAILY. (Patient taking differently: Place 1 spray into both nostrils daily as needed for allergies.)   fluticasone furoate-vilanterol (BREO ELLIPTA) 100-25 MCG/ACT AEPB Inhale 1 puff into the lungs daily.   furosemide (LASIX) 40 MG tablet TAKE (1) TABLET DAILY AS NEEDED FOR (EXCESSIVE) FLUID.   glucose blood (ACCU-CHEK GUIDE) test strip Test BS up to 4 times a day Dx E11.9   hydrALAZINE (APRESOLINE) 25 MG tablet TAKE 1 TABLET BY MOUTH 3 TIMES DAILY AS NEEDED (FOR SEVERE HYPERTENSION/SYSTOLIC NUMBER 295 OR GREATER).   HYDROcodone-acetaminophen (NORCO) 7.5-325 MG tablet Take 1 tablet by mouth in the morning, at noon, and at bedtime.   Ibuprofen-diphenhydrAMINE HCl (ADVIL PM) 200-25 MG CAPS Take 2 capsules by mouth at bedtime.   ipratropium (ATROVENT) 0.02 % nebulizer solution USE 1 VIAL (3ML) IN NEBULIZER EVERY 6 HOURS AS NEEDED FOR WHEEZING OR SHORTNESS OF BREATH.   isosorbide mononitrate (IMDUR) 30 MG 24 hr tablet Take 1.5 tablets (45 mg total) by mouth daily.   lamoTRIgine (LAMICTAL) 150 MG tablet Take 1 tablet (150 mg total) by mouth daily.   levETIRAcetam (KEPPRA) 500 MG tablet Take 1 tablet (500 mg total) by mouth 2 (two) times daily.   levocetirizine (XYZAL) 5 MG tablet TAKE 1 TABLET BY MOUTH EVERY MORNING.   lidocaine (LIDODERM) 5 % Place 1 patch onto the skin daily. Remove & Discard patch within 12 hours or as directed by MD (Patient taking differently: Place 1 patch onto the skin daily as needed (pain). Remove & Discard patch within 12 hours or as directed by MD)   lidocaine (XYLOCAINE) 5 % ointment APPLY TO AFFECTED AREA 3 TIMES A DAY AS NEEDED FOR MILD OR MODERATE PAIN.   methocarbamol (ROBAXIN) 500 MG tablet Take 1 tablet (500 mg total) by mouth every 8 (eight) hours as needed for muscle spasms.  (Patient not taking: Reported on 09/12/2021)   methocarbamol (ROBAXIN) 750 MG tablet Take 1 tablet (750 mg total) by mouth every 12 (twelve) hours as needed for muscle spasms. (Patient not taking: Reported on 09/12/2021)   nitroGLYCERIN (NITROSTAT) 0.4 MG SL tablet PLACE ONE (1) TABLET UNDER TONGUE EVERY 5 MINUTES UP TO (3) DOSES AS NEEDED FOR CHEST PAIN.   ondansetron (ZOFRAN ODT) 4 MG disintegrating tablet Take 1 tablet (4 mg total) by mouth every 8 (eight) hours as needed for nausea or vomiting.   ondansetron (ZOFRAN) 4 MG tablet TAKE 1 TABLET BY MOUTH EVERY 8 HOURS AS NEEDED FOR NAUSEA AND VOMITING.   oxybutynin (DITROPAN-XL) 5 MG 24 hr tablet Take 1 tablet (5 mg total) by mouth at bedtime.   Probiotic Product (PROBIOTIC PO) Take 1 capsule by mouth daily.   rosuvastatin (CRESTOR) 10 MG tablet  Take 1 tablet (10 mg total) by mouth daily.   traZODone (DESYREL) 150 MG tablet Take 1 tablet (150 mg total) by mouth at bedtime.   valACYclovir (VALTREX) 1000 MG tablet TAKE 1 TABLET 3 TIMES A DAY. TO BE TAKEN ONLY WHEN SHE HAS OUT BREAK.   valACYclovir (VALTREX) 500 MG tablet Take 1 tablet (500 mg total) by mouth 2 (two) times daily.   No facility-administered encounter medications on file as of 09/12/2021.    Past Surgical History:  Procedure Laterality Date   BACK SURGERY     BREAST SURGERY     lumpectomy   CARDIAC CATHETERIZATION  10/06/2011   CATARACT EXTRACTION W/PHACO Right 07/31/2017   Procedure: CATARACT EXTRACTION PHACO AND INTRAOCULAR LENS PLACEMENT ([place]);  Surgeon: [name], MD;  Location: [facility];  Service: Ophthalmology;  Laterality: Right;  CDE: 2.33   CATARACT EXTRACTION W/PHACO Left 08/14/2017   Procedure: CATARACT EXTRACTION PHACO AND INTRAOCULAR LENS PLACEMENT ([facility]);  Surgeon: [name], MD;  Location: [facility];  Service: Ophthalmology;  Laterality: Left;  CDE: 2.74   CHOLECYSTECTOMY     CYSTOSCOPY     stone   DIAGNOSTIC LAPAROSCOPY     laparoscopic cholecystectomy    DOPPLER ECHOCARDIOGRAPHY  2009   ESOPHAGOGASTRODUODENOSCOPY N/A 10/31/2020   Procedure: ESOPHAGOGASTRODUODENOSCOPY (E[facility]);  Surgeon: Greer Pickerel, MD;  Location: [facility];  Service: General;  Laterality: N/A;   EYE SURGERY Bilateral 08/14/2017   cataract removal   head up tilt table testing  06/15/2007   [name]   HEMORRHOID SURGERY     HERNIA REPAIR     insertion of implatable loop recorder  08/11/2007   [name]   POSTERIOR CERVICAL FUSION/FORAMINOTOMY N/A 12/19/2013   Procedure: RIGHT C3-4.C4-5 AND C5-6 FORAMINOTOMIES;  Surgeon: [name], MD;  Location: [place];  Service: Orthopedics;  Laterality: N/A;   TOTAL HIP ARTHROPLASTY Left 01/31/2020   Procedure: LEFT TOTAL HIP ARTHROPLASTY ANTERIOR APPROACH;  Surgeon: Mcarthur Rossetti, MD;  Location: [place];  Service: Orthopedics;  Laterality: Left;   TOTAL SHOULDER ARTHROPLASTY Right 11/15/2019   Procedure: RIGHT TOTAL SHOULDER ARTHROPLASTY;  Surgeon: Meredith Pel, MD;  Location: [facility];  Service: Orthopedics;  Laterality: Right;   TOTAL SHOULDER REVISION Right 06/11/2021   Procedure: RIGHT SHOULDER CONVERSION TOTAL SHOULDER ARTHROPLASTY to REVERSE TOTAL SHOULDER ARTHROPLASTY;  Surgeon: Meredith Pel, MD;  Location: [place];  Service: Orthopedics;  Laterality: Right;   TUBAL LIGATION     UPPER GASTROINTESTINAL ENDOSCOPY     XI ROBOTIC ASSISTED HIATAL HERNIA REPAIR N/A 10/31/2020   Procedure: XI ROBOTIC ASSISTED HIATAL HERNIA REPAIR WITH PARTIAL FUNDOPLICATION;  Surgeon: Greer Pickerel, MD;  Location: [facility];  Service: General;  Laterality: N/A;    Family History  Problem Relation Age of Onset   Heart attack Father    Mental illness Father    Mental illness Mother    Heart attack Brother        stents   Alcohol abuse Brother    Heart disease Brother    Drug abuse Brother    Diabetes Brother    Colon cancer Maternal Aunt    Cirrhosis Brother    Stomach cancer Neg Hx    Esophageal cancer Neg Hx    Rectal  cancer Neg Hx           Review of Systems  Constitutional:  Negative for diaphoresis.  Eyes:  Negative for pain.  Respiratory:  Negative for shortness of breath.   Cardiovascular:  Negative for chest pain, palpitations and leg swelling.  Gastrointestinal:  Negative for abdominal pain.  Endocrine: Negative for polydipsia.  Skin:  Negative for rash.  Neurological:  Negative for dizziness, weakness and headaches.  Hematological:  Does not bruise/bleed easily.  All other systems reviewed and are negative.      Objective:   Physical Exam Vitals reviewed.  Constitutional:      Appearance: Normal appearance.  Cardiovascular:     Rate and Rhythm: Normal rate and regular rhythm.     Heart sounds: Normal heart sounds.  Pulmonary:     Effort: Pulmonary effort is normal.     Breath sounds: Normal breath sounds.  Musculoskeletal:     Comments: Ises slowly form sitting to standing FROM of lumbar spine with pain or rotation (-) SLR bil FROM of cervical spine with pain on flexion and extension. Grips equal bil  Skin:    General: Skin is warm.  Neurological:     General: No focal deficit present.     Mental Status: She is alert and oriented to person, place, and time.  Psychiatric:        Mood and Affect: Mood normal.        Behavior: Behavior normal.     BP 124/84   Pulse 60   Temp 97.8 F (36.6 C) (Temporal)   Resp 20   Ht _0  (1.575 m)   Wt 123 lb (55.8 kg)   SpO2 96%   BMI 22.50 kg/m         Assessment & Plan:  [name] comes in today with chief complaint of Pain Management   Diagnosis and orders addressed:  1. Lumbosacral spondylosis without myelopathy Rest Moist heat - HYDROcodone-acetaminophen (NORCO) 7.5-325 MG tablet; Take 1 tablet by mouth in the morning, at noon, and at bedtime.  Dispense: 90 tablet; Refill: 0 - HYDROcodone-acetaminophen (NORCO) 7.5-325 MG tablet; Take 1 tablet by mouth in the morning, at noon, and at bedtime.   Dispense: 90 tablet; Refill: 0 - HYDROcodone-acetaminophen (NORCO) 7.5-325 MG tablet; Take 1 tablet by mouth in the morning, at noon, and at bedtime.  Dispense: 90 tablet; Refill: 0  2. Cervical spondylosis without myelopathy  3. Neural foraminal stenosis of cervical spine  4. Skin lesion - Ambulatory referral to Dermatology   Labs pending Health Maintenance reviewed Diet and exercise encouraged  Follow up plan: 3 months   [name] Hassell Done, FNP

## 2021-09-12 NOTE — Progress Notes (Signed)
Clinical Summary [name] is a 66 y.o.female seen today for follow up of the following medical problems.    1. 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 Issues getting arranged with transporation.          - we changed florinef to 0.8m every other day due to high bp's - chronic symptoms are stable. Taking florinef 0.160mevery otherday, takes hydral and coreg prn.      2. Chest pain - several year history of chest pain - normal cath in 2013, normal stress test 2013 - [facility]. Negative workup for ACS. She was discharged with an out patient Lexiscan which showed no evidence of ischemia.   -  echo 11/2014 LVEF 55-60%, no WMAs.  - 09/2017 nuclear stress no ischemia.  - started on imdur by pcp with improved symptoms.      12/2018 admission with chest pain - no objective evidence of ischemia by EKG or enzymes this admission - echo LVE 6081-19%grade I diastolic dysfunction  -  symptoms were noncardiac in description. Lasting constantly x 6 days, worst with deep breathing.   - no recent symptoms.    3. Hyperlipidemia -12/2020 TC 125 TG 81 HDL 43 LDL 66 - 06/2021 TC 1523 TG 83 HDL 63 LDL 73 - she is on crestor  4. 05/2021 shoulder replacement  [place].  Past Medical History:  Diagnosis Date   Allergy    Anemia    [place]   [place]    Anxiety    Arthritis    RHEUMATOID   Asthma    Bipolar 1 disorder (HCC)    Blood transfusion without reported diagnosis    Cataracts, bilateral 07/2017   CHF (congestive heart failure) (HCC)    COPD (chronic obstructive pulmonary disease) (HCC)    Coronary artery disease     reported hx of "MI";  Echo 2009 with normal LVF;  Myoview 05/2011: no ischemia   Depression    Diabetes mellitus without complication (HCC)    Dyslipidemia    Dysrhythmia    SVT   Esophageal stricture    Fibromyalgia    GERD (gastroesophageal reflux disease)    H/O hiatal hernia    Head injury, unspecified    Headache    migraines   Herpes simplex infection    History of kidney stones    History of loop recorder    Managed by Dr. GrCrissie Sickles Hyperlipidemia    Hypertension    Insomnia    [facility] 66 [facility]    Pneumonia    hx   [place]iz[place]   Shortness of breath    Sleep apnea    ? neg   Spinal stenosis of lumbar region    Spondylolisthesis    Status post placement of implantable loop recorder    Supraventricular tachycardia (HCC)    Syncope and collapse    s/p ILR; no arhythmogenic cause identified   UTI (lower urinary tract infection)      Allergies  Allergen Reactions   Codeine Other (See Comments)    "I will have a  heart attack."   Morphine And Related Other (See Comments)    "It will cause me to have a heart attack."   Ambien [Zolpidem Tartrate] Nausea And Vomiting   Clonidine Derivatives Nausea And Vomiting    gerd - caused acid reflux per pt   Metformin And Related Nausea And Vomiting    cramping from metformin   Lyrica [Pregabalin] Swelling and Other (See Comments)    Weight gain   Neurontin [Gabapentin] Other (See Comments)    Causes elevated LFTs      Current Outpatient Medications  Medication Sig Dispense Refill   Accu-Chek Softclix Lancets lancets USE TO TEST BLOOD SUGAR UP TO 4 TIMES A DAY AS DIRECTED. R73.03 100 each 11   acyclovir ointment (ZOVIRAX) 5 % APPLY TOPICALLY EVERY 3 HOURS. 30 g 0   albuterol (VENTOLIN HFA) 108 (90 Base) MCG/ACT inhaler INHALE 2 PUFFS EVERY 6 HOURS AS NEEDED FOR SHORTNESS OF BREATH AND WHEEZING. 6.7 g 5   Alcohol Swabs (B-D SINGLE USE SWABS REGULAR) PADS USE 1 PAD DAILY WHEN  CHECKING BLOOD SUGAR. R73.03 100 each 3   amoxicillin (AMOXIL) 875 MG tablet Take 1 tablet (875 mg total) by mouth 2 (two) times daily. 1 po BID 20 tablet 0   aspirin EC 81 MG EC tablet Take 1 tablet (81 mg total) by mouth daily. Swallow whole. 30 tablet 0   blood glucose meter kit and supplies Dispense based on patient and insurance preference. Use up to four times daily as directed. (FOR ICD-10 E10.9, E11.9). 1 each 0   busPIRone (BUSPAR) 10 MG tablet Take 1 tablet (10 mg total) by mouth 2 (two) times daily as needed. 60 tablet 5   butalbital-acetaminophen-caffeine (FIORICET) 50-325-40 MG tablet TAKE 1 TABLET BY MOUTH EVERY 6 HOURS AS NEEDED FOR HEADACHE. 20 tablet 1   carvedilol (COREG) 3.125 MG tablet Take 0.5 tablets (1.5625 mg total) by mouth 2 (two) times daily with a meal. BP over 150 before taken 180 tablet 1   celecoxib (CELEBREX) 200 MG capsule TAKE 1 CAPSULE BY MOUTH TWICE DAILY BETWEEN MEALS AS NEEDED. 60 capsule 0   dexlansoprazole (DEXILANT) 60 MG capsule Take 1 capsule (60 mg total) by mouth daily. 90 capsule 1   diclofenac Sodium (VOLTAREN) 1 % GEL APPLY 4 GRAMS TOPICALLY 4 TIMES A DAY. (Patient taking differently: Apply 4 g topically 4 (four) times daily as needed (pain).) 100 g 0   DULoxetine (CYMBALTA) 60 MG capsule Take 1 capsule (60 mg total) by mouth at bedtime. 90 capsule 1   empagliflozin (JARDIANCE) 25 MG TABS tablet Take 1 tablet (25 mg total) by mouth daily before breakfast. 90 tablet 3   escitalopram (LEXAPRO) 20 MG tablet Take 1 tablet (20 mg total) by mouth daily. 90 tablet 1   estradiol (ESTRACE) 0.1 MG/GM vaginal cream USE ONE APPLICATORFUL IN THE VAGINA AT BEDTIME AS DIRECTED 42.5 g 2   FIBER PO Take 1 tablet by mouth in the morning and at bedtime.     fludrocortisone (FLORINEF) 0.1 MG tablet TAKE 1 TABLET EVERY OTHER DAY. 45 tablet 3   fluticasone (FLONASE) 50 MCG/ACT nasal spray USE 1 SPRAY IN EACH NOSTRIL ONCE DAILY. (Patient taking differently: Place 1 spray into  both nostrils daily as needed for allergies.) 16 g 5   fluticasone furoate-vilanterol (BREO ELLIPTA) 100-25 MCG/ACT AEPB Inhale 1 puff into the lungs daily. 1 each 11   furosemide (LASIX) 40 MG tablet TAKE (1) TABLET DAILY AS NEEDED FOR (EXCESSIVE) FLUID. [place]  tablet 1   glucose blood (ACCU-CHEK GUIDE) test strip Test BS up to 4 times a day Dx E11.9 100 strip 11   hydrALAZINE (APRESOLINE) 25 MG tablet TAKE 1 TABLET BY MOUTH 3 TIMES DAILY AS NEEDED (FOR SEVERE HYPERTENSION/SYSTOLIC NUMBER 301 OR GREATER). 90 tablet 0   HYDROcodone-acetaminophen (NORCO) 7.5-325 MG tablet Take 1 tablet by mouth in the morning, at noon, and at bedtime. 90 tablet 0   Ibuprofen-diphenhydrAMINE HCl (ADVIL PM) 200-25 MG CAPS Take 2 capsules by mouth at bedtime.     ipratropium (ATROVENT) 0.02 % nebulizer solution USE 1 VIAL (3ML) IN NEBULIZER EVERY 6 HOURS AS NEEDED FOR WHEEZING OR SHORTNESS OF BREATH. 150 mL 2   isosorbide mononitrate (IMDUR) 30 MG 24 hr tablet Take 1.5 tablets (45 mg total) by mouth daily. 45 tablet 6   lamoTRIgine (LAMICTAL) 150 MG tablet Take 1 tablet (150 mg total) by mouth daily. 90 tablet 1   levETIRAcetam (KEPPRA) 500 MG tablet Take 1 tablet (500 mg total) by mouth 2 (two) times daily. 180 tablet 1   levocetirizine (XYZAL) 5 MG tablet TAKE 1 TABLET BY MOUTH EVERY MORNING. 30 tablet 0   lidocaine (LIDODERM) 5 % Place 1 patch onto the skin daily. Remove & Discard patch within 12 hours or as directed by MD (Patient taking differently: Place 1 patch onto the skin daily as needed (pain). Remove & Discard patch within 12 hours or as directed by MD) 30 patch 3   lidocaine (XYLOCAINE) 5 % ointment APPLY TO AFFECTED AREA 3 TIMES A DAY AS NEEDED FOR MILD OR MODERATE PAIN. 35.44 g 0   methocarbamol (ROBAXIN) 500 MG tablet Take 1 tablet (500 mg total) by mouth every 8 (eight) hours as needed for muscle spasms. 30 tablet 0   methocarbamol (ROBAXIN) 750 MG tablet Take 1 tablet (750 mg total) by mouth every 12  (twelve) hours as needed for muscle spasms. 30 tablet 0   nitroGLYCERIN (NITROSTAT) 0.4 MG SL tablet PLACE ONE (1) TABLET UNDER TONGUE EVERY 5 MINUTES UP TO (3) DOSES AS NEEDED FOR CHEST PAIN. 25 tablet 0   ondansetron (ZOFRAN ODT) 4 MG disintegrating tablet Take 1 tablet (4 mg total) by mouth every 8 (eight) hours as needed for nausea or vomiting. 20 tablet 0   ondansetron (ZOFRAN) 4 MG tablet TAKE 1 TABLET BY MOUTH EVERY 8 HOURS AS NEEDED FOR NAUSEA AND VOMITING. 20 tablet 0   oxybutynin (DITROPAN-XL) 5 MG 24 hr tablet Take 1 tablet (5 mg total) by mouth at bedtime. 90 tablet 1   Probiotic Product (PROBIOTIC PO) Take 1 capsule by mouth daily.     rosuvastatin (CRESTOR) 10 MG tablet Take 1 tablet (10 mg total) by mouth daily. 90 tablet 1   traZODone (DESYREL) 150 MG tablet Take 1 tablet (150 mg total) by mouth at bedtime. 90 tablet 1   valACYclovir (VALTREX) 1000 MG tablet TAKE 1 TABLET 3 TIMES A DAY. TO BE TAKEN ONLY WHEN SHE HAS OUT BREAK. 21 tablet 2   valACYclovir (VALTREX) 500 MG tablet Take 1 tablet (500 mg total) by mouth 2 (two) times daily. 180 tablet 1   No current facility-administered medications for this visit.     Past Surgical History:  Procedure Laterality Date   BACK SURGERY     BREAST SURGERY     lumpectomy   CARDIAC CATHETERIZATION  10/06/2011   CATARACT EXTRACTION W/PHACO Right 07/31/2017   Procedure: CATARACT EXTRACTION PHACO AND INTRAOCULAR LENS PLACEMENT ([place]);  Surgeon: Marisa Hua,  Jeneen Rinks, MD;  Location: [facility];  Service: Ophthalmology;  Laterality: Right;  CDE: 2.33   CATARACT EXTRACTION W/PHACO Left 08/14/2017   Procedure: CATARACT EXTRACTION PHACO AND INTRAOCULAR LENS PLACEMENT (IOC);  Surgeon: [name], MD;  Location: [facility];  Service: Ophthalmology;  Laterality: Left;  CDE: 2.74   CHOLECYSTECTOMY     CYSTOSCOPY     stone   DIAGNOSTIC LAPAROSCOPY     laparoscopic cholecystectomy   DOPPLER ECHOCARDIOGRAPHY  2009   ESOPHAGOGASTRODUODENOSCOPY N/A 10/31/2020    Procedure: ESOPHAGOGASTRODUODENOSCOPY (EGD);  Surgeon: Greer Pickerel, MD;  Location: [facility];  Service: General;  Laterality: N/A;   EYE SURGERY Bilateral 08/14/2017   cataract removal   head up tilt table testing  06/15/2007   [name]   HEMORRHOID SURGERY     HERNIA REPAIR     insertion of implatable loop recorder  08/11/2007   [name]   POSTERIOR CERVICAL FUSION/FORAMINOTOMY N/A 12/19/2013   Procedure: RIGHT C3-4.C4-5 AND C5-6 FORAMINOTOMIES;  Surgeon: [name], MD;  Location: [facility];  Service: Orthopedics;  Laterality: N/A;   TOTAL HIP ARTHROPLASTY Left 01/31/2020   Procedure: LEFT TOTAL HIP ARTHROPLASTY ANTERIOR APPROACH;  Surgeon: Mcarthur Rossetti, MD;  Location: [facility];  Service: Orthopedics;  Laterality: Left;   TOTAL SHOULDER ARTHROPLASTY Right 11/15/2019   Procedure: RIGHT TOTAL SHOULDER ARTHROPLASTY;  Surgeon: Meredith Pel, MD;  Location: [facility];  Service: Orthopedics;  Laterality: Right;   TOTAL SHOULDER REVISION Right 06/11/2021   Procedure: RIGHT SHOULDER CONVERSION TOTAL SHOULDER ARTHROPLASTY to REVERSE TOTAL SHOULDER ARTHROPLASTY;  Surgeon: Meredith Pel, MD;  Location: [place];  Service: Orthopedics;  Laterality: Right;   TUBAL LIGATION     UPPER GASTROINTESTINAL ENDOSCOPY     XI ROBOTIC ASSISTED HIATAL HERNIA REPAIR N/A 10/31/2020   Procedure: XI ROBOTIC ASSISTED HIATAL HERNIA REPAIR WITH PARTIAL FUNDOPLICATION;  Surgeon: Greer Pickerel, MD;  Location: [facility];  Service: General;  Laterality: N/A;     Allergies  Allergen Reactions   Codeine Other (See Comments)    "I will have a heart attack."   Morphine And Related Other (See Comments)    "It will cause me to have a heart attack."   Ambien [Zolpidem Tartrate] Nausea And Vomiting   Clonidine Derivatives Nausea And Vomiting    gerd - caused acid reflux per pt   Metformin And Related Nausea And Vomiting    cramping from metformin   Lyrica [Pregabalin] Swelling and Other (See Comments)     Weight gain   Neurontin [Gabapentin] Other (See Comments)    Causes elevated LFTs       Family History  Problem Relation Age of Onset   Heart attack Father    Mental illness Father    Mental illness Mother    Heart attack Brother        stents   Alcohol abuse Brother    Heart disease Brother    Drug abuse Brother    Diabetes Brother    Colon cancer Maternal Aunt    Cirrhosis Brother    Stomach cancer Neg Hx    Esophageal cancer Neg Hx    Rectal cancer Neg Hx      Social History Ms. Urquilla reports that she has never smoked. She has never used smokeless tobacco. Ms. Rettig reports no history of alcohol use.   Review of Systems CONSTITUTIONAL: No weight loss, fever, chills, weakness or fatigue.  HEENT: Eyes: No visual loss, blurred vision, double vision or yellow sclerae.No  hearing loss, sneezing, congestion, runny nose or sore throat.  SKIN: No rash or itching.  CARDIOVASCULAR: per hpi RESPIRATORY: No shortness of breath, cough or sputum.  GASTROINTESTINAL: No anorexia, nausea, vomiting or diarrhea. No abdominal pain or blood.  GENITOURINARY: No burning on urination, no polyuria NEUROLOGICAL: No headache, dizziness, syncope, paralysis, ataxia, numbness or tingling in the extremities. No change in bowel or bladder control.  MUSCULOSKELETAL: No muscle, back pain, joint pain or stiffness.  LYMPHATICS: No enlarged nodes. No history of splenectomy.  PSYCHIATRIC: No history of depression or anxiety.  ENDOCRINOLOGIC: No reports of sweating, cold or heat intolerance. No polyuria or polydipsia.  [name]   Physical Examination Today's Vitals   09/12/21 0916  BP: 110/70  Pulse: 68  SpO2: 98%  Weight: 123 lb (55.8 kg)  Height: 5' 2"  (1.575 m)   Body mass index is 22.5 kg/m.  Gen: resting comfortably, no acute distress HEENT: no scleral icterus, pupils equal round and reactive, no palptable cervical adenopathy,  CV: RRR, no m/r/g no jvd Resp: Clear to auscultation  bilaterally GI: abdomen is soft, non-tender, non-distended, normal bowel sounds, no hepatosplenomegaly MSK: extremities are warm, no edema.  Skin: warm, no rash Neuro:  no focal deficits Psych: appropriate affect   Diagnostic Studies 05/2011 Lexiscan MPI No ischemia     Jan 2009 Echo LEFT VENTRICLE: - Left ventricular size was normal. - Overall left ventricular systolic function was normal. - There were no left ventricular regional wall motion abnormalities. - Left ventricular wall thickness was normal.  AORTIC VALVE: - The aortic valve was trileaflet. - Aortic valve thickness was normal.  AORTA: - The aortic root was normal in size. - The aortic arch was normal.  MITRAL VALVE: - Mitral valve structure was normal.  Doppler interpretation(s): - There was trivial mitral valvular regurgitation.  LEFT ATRIUM: - Left atrial size was normal.  PULMONARY VEINS: - The pulmonary veins were grossly normal.  RIGHT VENTRICLE: - Right ventricular size was normal. - Right ventricular systolic function was normal. - Right ventricular wall thickness was normal.  PULMONIC VALVE: - The structure of the pulmonic valve appeared to be normal.  TRICUSPID VALVE: - The tricuspid valve structure was normal.  Doppler interpretation(s): - There was no significant tricuspid valvular regurgitation.  PULMONARY ARTERY: - The pulmonary artery was normal size.  RIGHT ATRIUM: - Right atrial size was normal.  SYSTEMIC VEINS: - The inferior vena cava was normal.  PERICARDIUM: - There was no pericardial effusion.  ---------------------------------------------------------------  SUMMARY - Overall left ventricular systolic function was normal. There were no left ventricular regional wall motion abnormalities. - The pulmonary veins were grossly normal.     09/2011 Cath Hemodynamic Findings:   Central aortic pressure: 108/58   Left ventricular pressure: 107/10/14   Angiographic  Findings:   Left main: No obstructive disease noted.   Left Anterior Descending Artery: Moderate to large sized vessel that courses to the apex. There is a moderate sized diagonal Kiah Vanalstine. No obstructive disease noted.   Circumflex Artery: Large, dominant artery with moderate sized first obtuse marginal Bushra Denman and left sided posterolateral Augustine Brannick with no disease noted.   Right Coronary Artery: Small, non-dominant vessel with no disease noted.   Left Ventricular Angiogram:LVEF=65%.   Impression:   1. No angiographic evidence of CAD   2. Normal LV systolic function   3. Non-cardiac chest pain   Recommendations: No further ischemic workup.   Complications: None. The patient tolerated the procedure well.     11/2014  echo Study Conclusions  - Left ventricle: The cavity size was normal. Systolic function was   normal. The estimated ejection fraction was in the range of 55%   to 60%. Wall motion was normal; there were no regional wall   motion abnormalities. There was an increased relative   contribution of atrial contraction to ventricular filling.   Doppler parameters are consistent with abnormal left ventricular   relaxation (grade 1 diastolic dysfunction). - Mitral valve: There was trivial regurgitation. - Tricuspid valve: There was trivial regurgitation. - Pulmonic valve: There was trivial regurgitation.      09/2017 nuclear stress There was no ST segment deviation noted during stress. Nonspecific T wave flattening in aVL and V2 seen throughout study. Defect 1: There is a medium defect of mild severity present in the mid inferior, apical inferior and apical lateral location. This appears to be due to soft tissue attenuation given normal regional wall motion. No ischemic territories. This is a low risk study. Nuclear stress EF: 65%.      Assessment and Plan  1. Syncope/Autonomic dysfunction/HTN - difficult balance between severe symptomatic both hypotension and HTN.  -unorthodox  regimen but through trial and error has done fairly well for her all things considred and we have continued - continues to do well overall, will continue current meds     2. Chest pain - long history of symptoms with multiple negative stress tests, most recently 09/2017  Lexiscan was negative. Cath 2013 with patent vessels. Symptoms did improve with imdur -no recent symptoms, continue current meds   3. Hyperlipidemia - she is at goal, continue current meds    F/u 6 months      Arnoldo Lenis, M.D.

## 2021-09-12 NOTE — Patient Instructions (Signed)
Opioid Pain Medicine Management Opioid pain medicines are strong medicines that are used to treat bad or very bad pain. When you take them for a short time, they can help you: Sleep better. Do better in physical therapy. Feel better during the first few days after you get hurt. Recover from surgery. Only take these medicines if a doctor says that you can. You should only take them for a short time. This is because opioids can be very addictive. This means that they are hard to stop taking. The longer you take opioids, the harder it may be to stop taking them. What are the risks? Opioids can cause problems (side effects). Taking them for more than 3 days raises your chance of problems, such as: Trouble pooping (constipation). Feeling sick to your stomach (nausea). Vomiting. Feeling very sleepy. Confusion. Not being able to stop taking the medicine. Breathing problems. Taking opioids for a long time can make it hard for you to do daily tasks. It can also put you at risk for: Car accidents. Depression. Suicide. Heart attack. Taking too much of the medicine (overdose). This can lead to death. What is a pain treatment plan? A pain treatment plan is a plan made by you and your doctor. Work with your doctor to make a plan for treating your pain. To help you do this: Talk about the goals of your treatment, including: How much pain you might expect to have. How you will manage the pain. Talk about the risks and benefits of taking these medicines for your condition. Remember that a good treatment plan uses more than one approach and lowers the risks of side effects. Tell your doctor about the amount of medicines you take and about any drug or alcohol use. Get your pain medicine prescriptions from only one doctor. Pain can be managed with other treatments. Work with your doctor to find other ways to help your pain, such as: Physical therapy or doing gentle exercises. Counseling. Eating healthy  foods. Massage. Meditation. Other pain medicines. How to use opioid pain medicine safely Taking medicine Take your pain medicine exactly as told by your doctor. Take it only when you need it. If your pain is not too bad, you may take less medicine if your doctor allows. If you have no pain, do not take the medicine unless your doctor tells you to take it. If your pain is very bad, do not take more medicine than your doctor told you to take. Call your doctor to know what to do. Write down the times when you take your pain medicine. Look at the times before you take your next dose. Take other over-the-counter or prescription medicines only as told by your doctor. Keeping yourself and others safe  While you are taking opioids: Do not drive, use machines, or power tools. Do not sign important papers (legal documents). Do not drink alcohol. Do not take sleeping pills. Do not take care of children by yourself. Do not do activities where you need to climb or be in high places, like working on a ladder. Do not go to a lake, river, ocean, swimming pool, or hot tub. Keep your opioids locked up or in a place where children cannot reach them. Do not share your pain medicine with anyone. Stopping your use of opioids If you have been taking opioids for more than a few weeks, you may need to slowly decrease (taper) how much you take until you stop taking them. Doing this can lower your chance   of having symptoms.  Symptoms that come from suddenly stopping the use of opioids include: Pain and cramping in your belly (abdomen). Feeling sick to your stomach (nausea).z Sweating. Feeling very sleepy. Feeling restless. Shaking you cannot control (tremors). Cravings for the medicine. Do not try to stop taking them by yourself. Work with your doctor to stop. Your doctor will help you take less until you are not taking the medicine at all. Getting rid of unused pills Do not save any pills that you did not  use. Get rid of the pills by: Taking them to a take-back program in your area. Bringing them to a pharmacy that receives unused pills. Flushing them down the toilet. Check the label or package insert of your medicine to see whether this is safe to do. Throwing them in the trash. Check the label or package insert of your medicine to see whether this is safe to do. If it is safe to throw them out: Take the pills out of their container. Put the pills into a container you can seal. Mix the pills with used coffee grounds, food scraps, dirt, or cat litter. Put this in the trash. Follow these instructions at home: Activity Do exercises as told by your doctor. Avoid doing things that make your pain worse. Return to your normal activities as told by your doctor. Ask your doctor what activities are safe for you. General instructions You may need to take these actions to prevent or treat constipation: Drink enough fluid to keep your pee (urine) pale yellow. Take over-the-counter or prescription medicines. Eat foods that are high in fiber. These include beans, whole grains, and fresh fruits and vegetables. Limit foods that are high in fat and sugar. These include fried or sweet foods. Keep all follow-up visits. Where to find support If you have been taking opioids for a long time, get help from a local support group or counselor. Ask your doctor about this. Where to find more information Centers for Disease Control and Prevention (CDC): www.cdc.gov [place] Food and Drug Administration (FDA): www.fda.gov Get help right away if: You may have taken too much of an opioid (overdosed). Common symptoms of an overdose: Your breathing is slower or more shallow than normal. You have a very slow heartbeat. Your speech is not normal. You vomit or you feel as if you may vomit. The black centers of your eyes (pupils) are smaller than normal. You have other potential symptoms: You feel very confused. You  faint. You are very sleepy. You have cold skin. You have blue lips or fingernails. You have thoughts of harming yourself or harming others. These symptoms may be an emergency. Get help right away. Call your local emergency services (911 in the [place] Do not wait to see if the symptoms will go away. Do not drive yourself to the hospital. Get help right away if you feel like you may hurt yourself or others, or have thoughts about taking your own life. Go to your nearest emergency room or: Call your local emergency services (911 in the [place] Call the National Poison Control Center at 1-800-222-1222. Call a suicide crisis helpline, such as the National Suicide Prevention Lifeline at 1-800-273-8255 or 988 in the [place] This is open 24 hours a day. Text the Crisis Text Line at 741741. Summary Opioid are strong medicines that are used to treat bad or very bad pain. A pain treatment plan is a plan made by you and your doctor. Work with your doctor to make a   plan for treating your pain. If you think that you or someone else may have taken too much of an opioid, get help right away. This information is not intended to replace advice given to you by your health care provider. Make sure you discuss any questions you have with your health care provider. Document Revised: 10/10/2020 Document Reviewed: 06/27/2020 Elsevier Patient Education  2023 [employer]  

## 2021-09-26 ENCOUNTER — Other Ambulatory Visit: Payer: Self-pay | Admitting: Cardiology

## 2021-09-26 ENCOUNTER — Other Ambulatory Visit: Payer: Self-pay | Admitting: Nurse Practitioner

## 2021-09-26 DIAGNOSIS — B009 Herpesviral infection, unspecified: Secondary | ICD-10-CM

## 2021-09-27 NOTE — Telephone Encounter (Signed)
Last OV 09/12/2021. Next OV 12/13/2021

## 2021-10-02 ENCOUNTER — Ambulatory Visit
Admission: RE | Admit: 2021-10-02 | Discharge: 2021-10-02 | Disposition: A | Discharge status: Home / Self Care | Payer: 59 | Source: Ambulatory Visit | Attending: Nurse Practitioner | Admitting: Nurse Practitioner

## 2021-10-02 DIAGNOSIS — Z1231 Encounter for screening mammogram for malignant neoplasm of breast: Secondary | ICD-10-CM

## 2021-10-07 ENCOUNTER — Other Ambulatory Visit: Payer: Self-pay | Admitting: Nurse Practitioner

## 2021-10-07 DIAGNOSIS — R928 Other abnormal and inconclusive findings on diagnostic imaging of breast: Secondary | ICD-10-CM

## 2021-10-15 ENCOUNTER — Ambulatory Visit
Admission: RE | Admit: 2021-10-15 | Discharge: 2021-10-15 | Disposition: A | Discharge status: Home / Self Care | Payer: 59 | Source: Ambulatory Visit | Attending: Nurse Practitioner | Admitting: Nurse Practitioner

## 2021-10-15 DIAGNOSIS — R928 Other abnormal and inconclusive findings on diagnostic imaging of breast: Secondary | ICD-10-CM

## 2021-10-28 ENCOUNTER — Other Ambulatory Visit: Payer: Self-pay | Admitting: Nurse Practitioner

## 2021-10-28 ENCOUNTER — Other Ambulatory Visit: Payer: Self-pay | Admitting: Cardiology

## 2021-10-28 DIAGNOSIS — B009 Herpesviral infection, unspecified: Secondary | ICD-10-CM

## 2021-10-28 DIAGNOSIS — N941 Unspecified dyspareunia: Secondary | ICD-10-CM

## 2021-11-26 ENCOUNTER — Ambulatory Visit (INDEPENDENT_AMBULATORY_CARE_PROVIDER_SITE_OTHER): Payer: 59 | Admitting: Nurse Practitioner

## 2021-11-26 ENCOUNTER — Encounter: Payer: Self-pay | Admitting: Nurse Practitioner

## 2021-11-26 VITALS — BP 104/73 | HR 69 | Temp 98.3°F | Resp 20 | Ht 62.0 in | Wt 123.0 lb

## 2021-11-26 DIAGNOSIS — I200 Unstable angina: Secondary | ICD-10-CM

## 2021-11-26 DIAGNOSIS — M48061 Spinal stenosis, lumbar region without neurogenic claudication: Secondary | ICD-10-CM

## 2021-11-26 DIAGNOSIS — K5790 Diverticulosis of intestine, part unspecified, without perforation or abscess without bleeding: Secondary | ICD-10-CM

## 2021-11-26 DIAGNOSIS — F5101 Primary insomnia: Secondary | ICD-10-CM

## 2021-11-26 DIAGNOSIS — R0789 Other chest pain: Secondary | ICD-10-CM

## 2021-11-26 DIAGNOSIS — M47817 Spondylosis without myelopathy or radiculopathy, lumbosacral region: Secondary | ICD-10-CM

## 2021-11-26 DIAGNOSIS — G2401 Drug induced subacute dyskinesia: Secondary | ICD-10-CM

## 2021-11-26 DIAGNOSIS — I10 Essential (primary) hypertension: Secondary | ICD-10-CM

## 2021-11-26 DIAGNOSIS — E785 Hyperlipidemia, unspecified: Secondary | ICD-10-CM

## 2021-11-26 DIAGNOSIS — F3341 Major depressive disorder, recurrent, in partial remission: Secondary | ICD-10-CM

## 2021-11-26 DIAGNOSIS — Z23 Encounter for immunization: Secondary | ICD-10-CM | POA: Diagnosis not present

## 2021-11-26 DIAGNOSIS — R7303 Prediabetes: Secondary | ICD-10-CM

## 2021-11-26 DIAGNOSIS — M810 Age-related osteoporosis without current pathological fracture: Secondary | ICD-10-CM

## 2021-11-26 DIAGNOSIS — B009 Herpesviral infection, unspecified: Secondary | ICD-10-CM

## 2021-11-26 DIAGNOSIS — N941 Unspecified dyspareunia: Secondary | ICD-10-CM

## 2021-11-26 DIAGNOSIS — F411 Generalized anxiety disorder: Secondary | ICD-10-CM

## 2021-11-26 DIAGNOSIS — M4802 Spinal stenosis, cervical region: Secondary | ICD-10-CM

## 2021-11-26 DIAGNOSIS — E876 Hypokalemia: Secondary | ICD-10-CM

## 2021-11-26 DIAGNOSIS — K2100 Gastro-esophageal reflux disease with esophagitis, without bleeding: Secondary | ICD-10-CM

## 2021-11-26 DIAGNOSIS — R569 Unspecified convulsions: Secondary | ICD-10-CM

## 2021-11-26 DIAGNOSIS — I5032 Chronic diastolic (congestive) heart failure: Secondary | ICD-10-CM

## 2021-11-26 DIAGNOSIS — G4733 Obstructive sleep apnea (adult) (pediatric): Secondary | ICD-10-CM

## 2021-11-26 DIAGNOSIS — E663 Overweight: Secondary | ICD-10-CM

## 2021-11-26 LAB — BAYER DCA HB A1C WAIVED: HB A1C (BAYER DCA - WAIVED): 5.6 % (ref 4.8–5.6)

## 2021-11-26 MED ORDER — EMPAGLIFLOZIN 25 MG PO TABS: 25.0000 mg | ORAL_TABLET | Freq: Every day | ORAL | 3 refills | Status: DC

## 2021-11-26 MED ORDER — HYDROCODONE-ACETAMINOPHEN 7.5-325 MG PO TABS: 1.0000 | ORAL_TABLET | Freq: Three times a day (TID) | ORAL | 0 refills | Status: DC

## 2021-11-26 MED ORDER — VALBENAZINE TOSYLATE 40 MG PO CAPS: 40.0000 mg | ORAL_CAPSULE | Freq: Every day | ORAL | 5 refills | Status: DC

## 2021-11-26 MED ORDER — LAMOTRIGINE 150 MG PO TABS: 150.0000 mg | ORAL_TABLET | Freq: Every day | ORAL | 1 refills | Status: DC

## 2021-11-26 MED ORDER — BUSPIRONE HCL 10 MG PO TABS: 10.0000 mg | ORAL_TABLET | Freq: Two times a day (BID) | ORAL | 5 refills | Status: DC | PRN

## 2021-11-26 MED ORDER — VALACYCLOVIR HCL 500 MG PO TABS
ORAL_TABLET | ORAL | 1 refills | Status: DC
Start: 2021-11-26 — End: 2022-10-17

## 2021-11-26 MED ORDER — DEXLANSOPRAZOLE 60 MG PO CPDR: 1.0000 | DELAYED_RELEASE_CAPSULE | Freq: Every day | ORAL | 1 refills | Status: DC

## 2021-11-26 MED ORDER — FUROSEMIDE 40 MG PO TABS: ORAL_TABLET | ORAL | 1 refills | Status: DC

## 2021-11-26 MED ORDER — ISOSORBIDE MONONITRATE ER 30 MG PO TB24: 45.0000 mg | ORAL_TABLET | Freq: Every day | ORAL | 6 refills | Status: DC

## 2021-11-26 MED ORDER — CARVEDILOL 3.125 MG PO TABS: 3.1250 mg | ORAL_TABLET | Freq: Two times a day (BID) | ORAL | 1 refills | Status: DC

## 2021-11-26 MED ORDER — HYDROCODONE-ACETAMINOPHEN 7.5-325 MG PO TABS: 1.0000 | ORAL_TABLET | Freq: Three times a day (TID) | ORAL | 0 refills | Status: AC

## 2021-11-26 MED ORDER — ESCITALOPRAM OXALATE 20 MG PO TABS: 20.0000 mg | ORAL_TABLET | Freq: Every day | ORAL | 1 refills | Status: DC

## 2021-11-26 MED ORDER — VALACYCLOVIR HCL 1 G PO TABS: 1000.0000 mg | ORAL_TABLET | Freq: Three times a day (TID) | ORAL | 2 refills | Status: DC

## 2021-11-26 MED ORDER — TRAZODONE HCL 150 MG PO TABS: 150.0000 mg | ORAL_TABLET | Freq: Every day | ORAL | 1 refills | Status: DC

## 2021-11-26 MED ORDER — DULOXETINE HCL 60 MG PO CPEP: 60.0000 mg | ORAL_CAPSULE | Freq: Every day | ORAL | 1 refills | Status: DC

## 2021-11-26 MED ORDER — ROSUVASTATIN CALCIUM 10 MG PO TABS: 10.0000 mg | ORAL_TABLET | Freq: Every day | ORAL | 1 refills | Status: DC

## 2021-11-26 MED ORDER — ESTRADIOL 0.1 MG/GM VA CREA: TOPICAL_CREAM | VAGINAL | 0 refills | Status: DC

## 2021-11-26 MED ORDER — OXYBUTYNIN CHLORIDE ER 5 MG PO TB24: 5.0000 mg | ORAL_TABLET | Freq: Every day | ORAL | 1 refills | Status: DC

## 2021-11-26 MED ORDER — LEVETIRACETAM 500 MG PO TABS: 500.0000 mg | ORAL_TABLET | Freq: Two times a day (BID) | ORAL | 1 refills | Status: DC

## 2021-11-26 NOTE — Addendum Note (Signed)
Addended by: Cleda Daub on: 11/26/2021 05:20 PM   Modules accepted: Orders

## 2021-11-26 NOTE — Patient Instructions (Signed)
Hypoglycemia Hypoglycemia occurs when the level of sugar (glucose) in the blood is too low. Hypoglycemia can happen in people who have or do not have diabetes. It can develop quickly, and it can be a medical emergency. For most people, a blood glucose level below 70 mg/dL (3.9 mmol/L) is considered hypoglycemia. Glucose is a type of sugar that provides the body's main source of energy. Certain hormones (insulin and glucagon) control the level of glucose in the blood. Insulin lowers blood glucose, and glucagon raises blood glucose. Hypoglycemia can result from having too much insulin in the bloodstream, or from not eating enough food that contains glucose. You may also have reactive hypoglycemia, which happens within 4 hours after eating a meal. What are the causes? Hypoglycemia occurs most often in people who have diabetes and may be caused by: Diabetes medicine. Not eating enough, or not eating often enough. Increased physical activity. Drinking alcohol on an empty stomach. If you do not have diabetes, hypoglycemia may be caused by: A tumor in the pancreas. Not eating enough, or not eating for long periods at a time (fasting). A severe infection or illness. Problems after having bariatric surgery. Organ failure, such as kidney or liver failure. Certain medicines. What increases the risk? Hypoglycemia is more likely to develop in people who: Have diabetes and take medicines to lower blood glucose. Abuse alcohol. Have a severe illness. What are the signs or symptoms? Symptoms vary depending on whether the condition is mild, moderate, or severe. Mild hypoglycemia Hunger. Sweating and feeling clammy. Dizziness or feeling light-headed. Sleepiness or restless sleep. Nausea. Increased heart rate. Headache. Blurry vision. Mood changes, such as irritability or anxiety. Tingling or numbness around the mouth, lips, or tongue. Moderate hypoglycemia Confusion and poor judgment. Behavior  changes. Weakness. Irregular heartbeat. A change in coordination. Severe hypoglycemia Severe hypoglycemia is a medical emergency. It can cause: Fainting. Seizures. Loss of consciousness (coma). Death. How is this diagnosed? Hypoglycemia is diagnosed with a blood test to measure your blood glucose level. This blood test is done while you are having symptoms. Your health care provider may also do a physical exam and review your medical history. How is this treated? This condition can be treated by immediately eating or drinking something that contains sugar with 15 grams of fast-acting carbohydrate, such as: 4 oz (120 mL) of fruit juice. 4 oz (120 mL) of regular soda (not diet soda). Several pieces of hard candy. Check food labels to find out how many pieces to eat for 15 grams. 1 Tbsp (15 mL) of sugar or honey. 4 glucose tablets. 1 tube of glucose gel. Treating hypoglycemia if you have diabetes If you are alert and able to swallow safely, follow the 15:15 rule: Take 15 grams of a fast-acting carbohydrate. Talk with your health care provider about how much you should take. Options for getting 15 grams of fast-acting carbohydrate include: Glucose tablets (take 4 tablets). Several pieces of hard candy. Check food labels to find out how many pieces to eat for 15 grams. 4 oz (120 mL) of fruit juice. 4 oz (120 mL) of regular soda (not diet soda). 1 Tbsp (15 mL) of sugar or honey. 1 tube of glucose gel. Check your blood glucose 15 minutes after you take the carbohydrate. If the repeat blood glucose level is still at or below 70 mg/dL (3.9 mmol/L), take 15 grams of a carbohydrate again. If your blood glucose level does not increase above 70 mg/dL (3.9 mmol/L) after 3 tries, seek emergency  medical care. After your blood glucose level returns to normal, eat a meal or a snack within 1 hour.  Treating severe hypoglycemia Severe hypoglycemia is when your blood glucose level is below 54 mg/dL (3  mmol/L). Severe hypoglycemia is a medical emergency. Get medical help right away. If you have severe hypoglycemia and you cannot eat or drink, you will need to be given glucagon. A family member or close friend should learn how to check your blood glucose and how to give you glucagon. Ask your health care provider if you need to have an emergency glucagon kit available. Severe hypoglycemia may need to be treated in a hospital. The treatment may include getting glucose through an IV. You may also need treatment for the cause of your hypoglycemia. Follow these instructions at home:  General instructions Take over-the-counter and prescription medicines only as told by your health care provider. Monitor your blood glucose as told by your health care provider. If you drink alcohol: Limit how much you have to: 0-1 drink a day for women who are not pregnant. 0-2 drinks a day for men. Know how much alcohol is in your drink. In the [place] one drink equals one 12 oz bottle of beer (355 mL), one 5 oz glass of wine (148 mL), or one 1 oz glass of hard liquor (44 mL). Be sure to eat food along with drinking alcohol. Be aware that alcohol is absorbed quickly and may have lingering effects that may result in hypoglycemia later. Be sure to do ongoing glucose monitoring. Keep all follow-up visits. This is important. If you have diabetes: Always have a fast-acting carbohydrate (15 grams) option with you to treat low blood glucose. Follow your diabetes management plan as directed by your health care provider. Make sure you: Know the symptoms of hypoglycemia. It is important to treat it right away to prevent it from becoming severe. Check your blood glucose as often as told. Always check before and after exercise. Always check your blood glucose before you drive a motorized vehicle. Take your medicines as told. Follow your meal plan. Eat on time, and do not skip meals. Share your diabetes management plan with  people in your workplace, school, and household. Carry a medical alert card or wear medical alert jewelry. Where to find more information American Diabetes Association: www.diabetes.org Contact a health care provider if: You have problems keeping your blood glucose in your target range. You have frequent episodes of hypoglycemia. Get help right away if: You continue to have hypoglycemia symptoms after eating or drinking something that contains 15 grams of fast-acting carbohydrate, and you cannot get your blood glucose above 70 mg/dL (3.9 mmol/L) while following the 15:15 rule. Your blood glucose is below 54 mg/dL (3 mmol/L). You have a seizure. You faint. These symptoms may represent a serious problem that is an emergency. Do not wait to see if the symptoms will go away. Get medical help right away. Call your local emergency services (911 in the [place] Do not drive yourself to the hospital. Summary Hypoglycemia occurs when the level of sugar (glucose) in the blood is too low. Hypoglycemia can happen in people who have or do not have diabetes. It can develop quickly, and it can be a medical emergency. Make sure you know the symptoms of hypoglycemia and how to treat it. Always have a fast-acting carbohydrate option with you to treat low blood sugar. This information is not intended to replace advice given to you by your health care provider.  Make sure you discuss any questions you have with your health care provider. Document Revised: 02/16/2020 Document Reviewed: 02/16/2020 Elsevier Patient Education  [place].

## 2021-11-26 NOTE — Progress Notes (Signed)
Subjective:    Patient ID: [name], female    DOB: [birth year], 66 y.o.   MRN: [id number]   Chief Complaint: medical management of chronic issues     HPI:  [name] is a 66 y.o. who identifies as a female who was assigned female at birth.   Social history: Lives with: by herself- family checks on her frequently Work history: disability   Comes in today for follow up of the following chronic medical issues:  1. Essential hypertension No c/o chest  pain, sob or headache. Does not check blood pressure at home. BP Readings from Last 3 Encounters:  11/26/21 104/73  09/12/21 124/84  09/12/21 110/70      2. Chronic diastolic CHF (congestive heart failure) ([place]) 3. Unstable angina (HCC) Has not had any recent chest pain. Last saw cardiology on 09/12/21. Review of office note- no changes were made to plan of care.  4. Diverticulosis Has had no recent flare ups  5. Gastroesophageal reflux disease with esophagitis without hemorrhage Dexilant daily and is doing well. No symptoms when takes meds  6. Obstructive sleep apnea syndrome Does not wear cpap at night. Needs sleep study repeated  7. Hyperlipidemia with target LDL less than 100 Doe snot really watch diet and does no dedicated  exercise Lab Results  Component Value Date   CHOL 152 07/01/2021   HDL 63 07/01/2021   LDLCALC 73 07/01/2021   LDLDIRECT 142.1 04/22/2007   TRIG 83 07/01/2021   CHOLHDL 2.4 07/01/2021     8. Hypokalemia No muscle cram,ps. Is on potassium supplement. Lab Results  Component Value Date   K 4.5 07/01/2021     9. Recurrent major depressive disorder, in partial remission ([place]) 10. GAD (generalized anxiety disorder) Is on cymbalta, lexapro and buspar daily. Is doing well.    11/26/2021   11:32 AM 09/09/2021    9:06 AM 07/01/2021    1:59 PM  Depression screen PHQ 2/9  Decreased Interest 0 0 0  Down, Depressed, Hopeless 0 0 0  PHQ - 2 Score 0 0 0  Altered sleeping 1  0   Tired, decreased energy 1  0  Change in appetite 0  0  Feeling bad or failure about yourself  0  0  Trouble concentrating 1  0  Moving slowly or fidgety/restless 0  0  Suicidal thoughts 0  0  PHQ-9 Score 3  0  Difficult doing work/chores Not difficult at all        11. Seizures ([place]) Is stilln keppra.  12. Neural foraminal stenosis of cervical spine 13. SPINAL STENOSIS OF LUMBAR REGION Pain assessment: Cause of pain- DDD and spondylosis Pain location- lower back Pain on scale of 1-10- 6/10 Frequency- daily What increases pain-activity What makes pain Better-rest Effects on ADL - none Any change in general medical condition-none  Current opioids rx- norco 7.5/325 TID # meds rx- 90 Effectiveness of current meds-helps Adverse reactions from pain meds-none Morphine equivalent- 22.5  Pill count performed-No Last drug screen - needs today ( high risk q72m moderate risk q619mlow risk yearly ) Urine drug screen today- Yes Was the NCDimmitteviewed- yes  If yes were their any concerning findings? - no   Overdose risk: 1    09/12/2021    3:09 PM  Opioid Risk   Alcohol 0  Illegal Drugs 0  Rx Drugs 0  Alcohol 0  Illegal Drugs 0  Rx Drugs 0  Age between 16-45 years  0  History of Preadolescent Sexual Abuse 3  Psychological Disease 2  ADD Negative  OCD Negative  Bipolar Positive  Depression 1  Opioid Risk Tool Scoring 6  Opioid Risk Interpretation Moderate Risk     Pain contract signed on:04/09/21   14. Herpes simplex virus (HSV) infection Is on valtrex daily and still has occasional flare ups  15. Primary insomnia Is on trazodone to sleep. Sleeps about 7-8 hours a night. Wake sup a lot. Needs sleep study repeated  16. Age-related osteoporosis without current pathological fracture Last dexascan was done 08/24/20. T score -1.3  17. Overweight (BMI 25.0-29.9) No recent weight changes Wt Readings from Last 3 Encounters:  11/26/21 123 lb (55.8 kg)  09/12/21  123 lb (55.8 kg)  09/12/21 123 lb (55.8 kg)   BMI Readings from Last 3 Encounters:  11/26/21 22.50 kg/m  09/12/21 22.50 kg/m  09/12/21 22.50 kg/m      New complaints: Says the she moves her  mouth all the time almost like she is chewing   Allergies  Allergen Reactions   Codeine Other (See Comments)    "I will have a heart attack."   Morphine And Related Other (See Comments)    "It will cause me to have a heart attack."   Ambien [Zolpidem Tartrate] Nausea And Vomiting   Clonidine Derivatives Nausea And Vomiting    gerd - caused acid reflux per pt   Metformin And Related Nausea And Vomiting    cramping from metformin   Lyrica [Pregabalin] Swelling and Other (See Comments)    Weight gain   Neurontin [Gabapentin] Other (See Comments)    Causes elevated LFTs    Outpatient Encounter Medications as of 11/26/2021  Medication Sig   Accu-Chek Softclix Lancets lancets TEST BLOOD SUGAR UP TO 4 TIMES A DAY AS DIRECTED. R73.03   acyclovir ointment (ZOVIRAX) 5 % APPLY TOPICALLY EVERY 3 HOURS.   albuterol (VENTOLIN HFA) 108 (90 Base) MCG/ACT inhaler INHALE 2 PUFFS EVERY 6 HOURS AS NEEDED FOR SHORTNESS OF BREATH AND WHEEZING.   Alcohol Swabs (B-D SINGLE USE SWABS REGULAR) PADS USE 1 PAD DAILY WHEN CHECKING BLOOD SUGAR. R73.03   amoxicillin (AMOXIL) 875 MG tablet Take 1 tablet (875 mg total) by mouth 2 (two) times daily. 1 po BID   aspirin EC 81 MG EC tablet Take 1 tablet (81 mg total) by mouth daily. Swallow whole.   blood glucose meter kit and supplies Dispense based on patient and insurance preference. Use up to four times daily as directed. (FOR ICD-10 E10.9, E11.9).   busPIRone (BUSPAR) 10 MG tablet Take 1 tablet (10 mg total) by mouth 2 (two) times daily as needed.   butalbital-acetaminophen-caffeine (FIORICET) 50-325-40 MG tablet TAKE 1 TABLET BY MOUTH EVERY 6 HOURS AS NEEDED FOR HEADACHE.   carvedilol (COREG) 3.125 MG tablet Take 3.125 mg by mouth 2 (two) times daily with a meal.    celecoxib (CELEBREX) 200 MG capsule TAKE 1 CAPSULE BY MOUTH TWICE DAILY BETWEEN MEALS AS NEEDED.   dexlansoprazole (DEXILANT) 60 MG capsule Take 1 capsule (60 mg total) by mouth daily.   diclofenac Sodium (VOLTAREN) 1 % GEL APPLY 4 GRAMS TOPICALLY 4 TIMES A DAY. (Patient taking differently: Apply 4 g topically 4 (four) times daily as needed (pain).)   DULoxetine (CYMBALTA) 60 MG capsule Take 1 capsule (60 mg total) by mouth at bedtime.   empagliflozin (JARDIANCE) 25 MG TABS tablet Take 1 tablet (25 mg total) by mouth daily before breakfast.   escitalopram (  LEXAPRO) 20 MG tablet Take 1 tablet (20 mg total) by mouth daily.   estradiol (ESTRACE) 0.1 MG/GM vaginal cream USE 1 APPLICATORFUL IN VAGINA AT BEDTIME AS DIRECTED.   FIBER PO Take 1 tablet by mouth in the morning and at bedtime.   fludrocortisone (FLORINEF) 0.1 MG tablet TAKE 1 TABLET EVERY OTHER DAY.   fluticasone (FLONASE) 50 MCG/ACT nasal spray USE 1 SPRAY IN EACH NOSTRIL ONCE DAILY. (Patient taking differently: Place 1 spray into both nostrils daily as needed for allergies.)   fluticasone furoate-vilanterol (BREO ELLIPTA) 100-25 MCG/ACT AEPB Inhale 1 puff into the lungs daily.   furosemide (LASIX) 40 MG tablet TAKE (1) TABLET DAILY AS NEEDED FOR (EXCESSIVE) FLUID.   glucose blood (ACCU-CHEK GUIDE) test strip Test BS up to 4 times a day Dx E11.9   hydrALAZINE (APRESOLINE) 25 MG tablet TAKE 1 TABLET BY MOUTH 3 TIMES DAILY AS NEEDED (FOR SEVERE HYPERTENSION/SYSTOLIC NUMBER 032 OR GREATER).   [START ON 11/27/2021] HYDROcodone-acetaminophen (NORCO) 7.5-325 MG tablet Take 1 tablet by mouth in the morning, at noon, and at bedtime.   HYDROcodone-acetaminophen (NORCO) 7.5-325 MG tablet Take 1 tablet by mouth in the morning, at noon, and at bedtime.   Ibuprofen-diphenhydrAMINE HCl (ADVIL PM) 200-25 MG CAPS Take 2 capsules by mouth at bedtime.   ipratropium (ATROVENT) 0.02 % nebulizer solution USE 1 VIAL (3ML) IN NEBULIZER EVERY 6 HOURS AS NEEDED FOR  WHEEZING OR SHORTNESS OF BREATH.   isosorbide mononitrate (IMDUR) 30 MG 24 hr tablet Take 1.5 tablets (45 mg total) by mouth daily.   lamoTRIgine (LAMICTAL) 150 MG tablet Take 1 tablet (150 mg total) by mouth daily.   levETIRAcetam (KEPPRA) 500 MG tablet Take 1 tablet (500 mg total) by mouth 2 (two) times daily.   levocetirizine (XYZAL) 5 MG tablet TAKE 1 TABLET BY MOUTH EVERY MORNING.   lidocaine (LIDODERM) 5 % Place 1 patch onto the skin daily. Remove & Discard patch within 12 hours or as directed by MD (Patient taking differently: Place 1 patch onto the skin daily as needed (pain). Remove & Discard patch within 12 hours or as directed by MD)   lidocaine (XYLOCAINE) 5 % ointment APPLY TO AFFECTED AREA 3 TIMES A DAY AS NEEDED FOR MILD OR MODERATE PAIN.   methocarbamol (ROBAXIN) 500 MG tablet Take 1 tablet (500 mg total) by mouth every 8 (eight) hours as needed for muscle spasms.   methocarbamol (ROBAXIN) 750 MG tablet Take 1 tablet (750 mg total) by mouth every 12 (twelve) hours as needed for muscle spasms.   nitroGLYCERIN (NITROSTAT) 0.4 MG SL tablet PLACE ONE (1) TABLET UNDER TONGUE EVERY 5 MINUTES UP TO (3) DOSES AS NEEDED FOR CHEST PAIN.   ondansetron (ZOFRAN ODT) 4 MG disintegrating tablet Take 1 tablet (4 mg total) by mouth every 8 (eight) hours as needed for nausea or vomiting.   ondansetron (ZOFRAN) 4 MG tablet TAKE 1 TABLET BY MOUTH EVERY 8 HOURS AS NEEDED FOR NAUSEA AND VOMITING.   oxybutynin (DITROPAN-XL) 5 MG 24 hr tablet Take 1 tablet (5 mg total) by mouth at bedtime.   Probiotic Product (PROBIOTIC PO) Take 1 capsule by mouth daily.   rosuvastatin (CRESTOR) 10 MG tablet Take 1 tablet (10 mg total) by mouth daily.   traZODone (DESYREL) 150 MG tablet Take 1 tablet (150 mg total) by mouth at bedtime.   valACYclovir (VALTREX) 1000 MG tablet TAKE 1 TABLET 3 TIMES A DAY. TO BE TAKEN ONLY WHEN SHE HAS OUT BREAK.   valACYclovir (Meno)  500 MG tablet TAKE (1) TABLET TWICE DAILY.   No  facility-administered encounter medications on file as of 11/26/2021.    Past Surgical History:  Procedure Laterality Date   BACK SURGERY     BREAST EXCISIONAL BIOPSY Right    1990s   BREAST SURGERY     lumpectomy   CARDIAC CATHETERIZATION  10/06/2011   CATARACT EXTRACTION W/PHACO Right 07/31/2017   Procedure: CATARACT EXTRACTION PHACO AND INTRAOCULAR LENS PLACEMENT (IOC);  Surgeon: [name], MD;  Location: [facility];  Service: Ophthalmology;  Laterality: Right;  CDE: 2.33   CATARACT EXTRACTION W/PHACO Left 08/14/2017   Procedure: CATARACT EXTRACTION PHACO AND INTRAOCULAR LENS PLACEMENT (IOC);  Surgeon: [name], MD;  Location: [facility];  Service: Ophthalmology;  Laterality: Left;  CDE: 2.74   CHOLECYSTECTOMY     CYSTOSCOPY     stone   DIAGNOSTIC LAPAROSCOPY     laparoscopic cholecystectomy   DOPPLER ECHOCARDIOGRAPHY  2009   ESOPHAGOGASTRODUODENOSCOPY N/A 10/31/2020   Procedure: ESOPHAGOGASTRODUODENOSCOPY (EGD);  Surgeon: Greer Pickerel, MD;  Location: [facility];  Service: General;  Laterality: N/A;   EYE SURGERY Bilateral 08/14/2017   cataract removal   head up tilt table testing  06/15/2007   [name]   HEMORRHOID SURGERY     HERNIA REPAIR     insertion of implatable loop recorder  08/11/2007   [name]   POSTERIOR CERVICAL FUSION/FORAMINOTOMY N/A 12/19/2013   Procedure: RIGHT C3-4.C4-5 AND C5-6 FORAMINOTOMIES;  Surgeon: [name], MD;  Location: [facility];  Service: Orthopedics;  Laterality: N/A;   TOTAL HIP ARTHROPLASTY Left 01/31/2020   Procedure: LEFT TOTAL HIP ARTHROPLASTY ANTERIOR APPROACH;  Surgeon: Mcarthur Rossetti, MD;  Location: [facility];  Service: Orthopedics;  Laterality: Left;   TOTAL SHOULDER ARTHROPLASTY Right 11/15/2019   Procedure: RIGHT TOTAL SHOULDER ARTHROPLASTY;  Surgeon: Meredith Pel, MD;  Location: [facility];  Service: Orthopedics;  Laterality: Right;   TOTAL SHOULDER REVISION Right 06/11/2021   Procedure: RIGHT SHOULDER CONVERSION TOTAL  SHOULDER ARTHROPLASTY to REVERSE TOTAL SHOULDER ARTHROPLASTY;  Surgeon: Meredith Pel, MD;  Location: [facility];  Service: Orthopedics;  Laterality: Right;   TUBAL LIGATION     UPPER GASTROINTESTINAL ENDOSCOPY     XI ROBOTIC ASSISTED HIATAL HERNIA REPAIR N/A 10/31/2020   Procedure: XI ROBOTIC ASSISTED HIATAL HERNIA REPAIR WITH PARTIAL FUNDOPLICATION;  Surgeon: Greer Pickerel, MD;  Location: [facility];  Service: General;  Laterality: N/A;    Family History  Problem Relation Age of Onset   Heart attack Father    Mental illness Father    Mental illness Mother    Heart attack Brother        stents   Alcohol abuse Brother    Heart disease Brother    Drug abuse Brother    Diabetes Brother    Colon cancer Maternal Aunt    Cirrhosis Brother    Stomach cancer Neg Hx    Esophageal cancer Neg Hx    Rectal cancer Neg Hx       Controlled substance contract: n/a     Review of Systems  Constitutional:  Negative for diaphoresis.  Eyes:  Negative for pain.  Respiratory:  Negative for shortness of breath.   Cardiovascular:  Negative for chest pain, palpitations and leg swelling.  Gastrointestinal:  Negative for abdominal pain.  Endocrine: Negative for polydipsia.  Skin:  Negative for rash.  Neurological:  Negative for dizziness, weakness and headaches.  Hematological:  Does not bruise/bleed easily.  All other systems  reviewed and are negative.      Objective:   Physical Exam Vitals and nursing note reviewed.  Constitutional:      General: She is not in acute distress.    Appearance: Normal appearance. She is well-developed.  HENT:     Head: Normocephalic.     Right Ear: Tympanic membrane normal.     Left Ear: Tympanic membrane normal.     Nose: Nose normal.     Mouth/Throat:     Mouth: Mucous membranes are moist.  Eyes:     Pupils: Pupils are equal, round, and reactive to light.  Neck:     Vascular: No carotid bruit or JVD.  Cardiovascular:     Rate and Rhythm: Normal rate  and regular rhythm.     Heart sounds: Normal heart sounds.  Pulmonary:     Effort: Pulmonary effort is normal. No respiratory distress.     Breath sounds: Normal breath sounds. No wheezing or rales.  Chest:     Chest wall: No tenderness.  Abdominal:     General: Bowel sounds are normal. There is no distension or abdominal bruit.     Palpations: Abdomen is soft. There is no hepatomegaly, splenomegaly, mass or pulsatile mass.     Tenderness: There is no abdominal tenderness.  Musculoskeletal:        General: Normal range of motion.     Cervical back: Normal range of motion and neck supple.  Lymphadenopathy:     Cervical: No cervical adenopathy.  Skin:    General: Skin is warm and dry.  Neurological:     Mental Status: She is alert and oriented to person, place, and time.     Deep Tendon Reflexes: Reflexes are normal and symmetric.  Psychiatric:        Behavior: Behavior normal.        Thought Content: Thought content normal.        Judgment: Judgment normal.     BP 104/73   Pulse 69   Temp 98.3 F (36.8 C) (Temporal)   Resp 20   Ht 5' 2"  (1.575 m)   Wt 123 lb (55.8 kg)   SpO2 95%   BMI 22.50 kg/m        Assessment & Plan:  AEISHA MINARIK comes in today with chief complaint of Medical Management of Chronic Issues   Diagnosis and orders addressed:  1. Essential hypertension Low sodium diet - furosemide (LASIX) 40 MG tablet; TAKE (1) TABLET DAILY AS NEEDED FOR (EXCESSIVE) FLUID.  Dispense: 90 tablet; Refill: 1 - carvedilol (COREG) 3.125 MG tablet; Take 1 tablet (3.125 mg total) by mouth 2 (two) times daily with a meal.  Dispense: 90 tablet; Refill: 1  2. Chronic diastolic CHF (congestive heart failure) ([place]) Keep follow up with cardiology  3. Unstable angina ([place]) Report any chest pain  4. Diverticulosis Watch diet to prevent flare up  5. Gastroesophageal reflux disease with esophagitis without hemorrhage Avoid spicy foods Do not eat 2 hours prior to  bedtime - dexlansoprazole (DEXILANT) 60 MG capsule; Take 1 capsule (60 mg total) by mouth daily.  Dispense: 90 capsule; Refill: 1  6. Obstructive sleep apnea syndrome Sleep study ordered  7. Hyperlipidemia with target LDL less than 100 Low fat diet - rosuvastatin (CRESTOR) 10 MG tablet; Take 1 tablet (10 mg total) by mouth daily.  Dispense: 90 tablet; Refill: 1  8. Hypokalemia Labs peni=ding  9. Recurrent major depressive disorder, in partial remission [facility]) Stress management -  DULoxetine (CYMBALTA) 60 MG capsule; Take 1 capsule (60 mg total) by mouth at bedtime.  Dispense: 90 capsule; Refill: 1 - escitalopram (LEXAPRO) 20 MG tablet; Take 1 tablet (20 mg total) by mouth daily.  Dispense: 90 tablet; Refill: 1 - busPIRone (BUSPAR) 10 MG tablet; Take 1 tablet (10 mg total) by mouth 2 (two) times daily as needed.  Dispense: 60 tablet; Refill: 5  10. GAD (generalized anxiety disorder) - lamoTRIgine (LAMICTAL) 150 MG tablet; Take 1 tablet (150 mg total) by mouth daily.  Dispense: 90 tablet; Refill: 1  11. Seizures (HCC) - levETIRAcetam (KEPPRA) 500 MG tablet; Take 1 tablet (500 mg total) by mouth 2 (two) times daily.  Dispense: 180 tablet; Refill: 1  12. Neural foraminal stenosis of cervical spine  13. SPINAL STENOSIS OF LUMBAR REGION  14. Herpes simplex virus (HSV) infection - valACYclovir (VALTREX) 1000 MG tablet; Take 1 tablet (1,000 mg total) by mouth 3 (three) times daily.  Dispense: 21 tablet; Refill: 2  15. Primary insomnia Bedtime routine - PSG Sleep Study; Future - traZODone (DESYREL) 150 MG tablet; Take 1 tablet (150 mg total) by mouth at bedtime.  Dispense: 90 tablet; Refill: 1  16. Age-related osteoporosis without current pathological fracture Weight bearing exercises  17. Overweight (BMI 25.0-29.9) Discussed diet and exercise for person with BMI >25 Will recheck weight in 3-6 months   18. Lumbosacral spondylosis without myelopathy  - HYDROcodone-acetaminophen  (NORCO) 7.5-325 MG tablet; Take 1 tablet by mouth in the morning, at noon, and at bedtime.  Dispense: 90 tablet; Refill: 0 - HYDROcodone-acetaminophen (NORCO) 7.5-325 MG tablet; Take 1 tablet by mouth in the morning, at noon, and at bedtime.  Dispense: 90 tablet; Refill: 0 - HYDROcodone-acetaminophen (NORCO) 7.5-325 MG tablet; Take 1 tablet by mouth in the morning, at noon, and at bedtime.  Dispense: 90 tablet; Refill: 0  19. Tardive dyskinesia Andd new med - valbenazine (INGREZZA) 40 MG capsule; Take 1 capsule (40 mg total) by mouth daily.  Dispense: 30 capsule; Refill: 5  20. Dyspareunia in female - estradiol (ESTRACE) 0.1 MG/GM vaginal cream; USE 1 APPLICATORFUL IN VAGINA AT BEDTIME AS DIRECTED.  Dispense: 42.5 g; Refill: 0  21. Other chest pain - isosorbide mononitrate (IMDUR) 30 MG 24 hr tablet; Take 1.5 tablets (45 mg total) by mouth daily.  Dispense: 45 tablet; Refill: 6  22. Pre-diabetes - empagliflozin (JARDIANCE) 25 MG TABS tablet; Take 1 tablet (25 mg total) by mouth daily before breakfast.  Dispense: 90 tablet; Refill: 3   Labs pending Health Maintenance reviewed Diet and exercise encouraged  Follow up plan: 4 months pain management   [name] Hassell Done, FNP

## 2021-11-27 LAB — CBC WITH DIFFERENTIAL/PLATELET
Basophils Absolute: 0 10*3/uL (ref 0.0–0.2)
Basos: 1 %
EOS (ABSOLUTE): 0.1 10*3/uL (ref 0.0–0.4)
Eos: 2 %
Hematocrit: 45.3 % (ref 34.0–46.6)
Hemoglobin: 15.3 g/dL (ref 11.1–15.9)
Immature Grans (Abs): 0 10*3/uL (ref 0.0–0.1)
Immature Granulocytes: 0 %
Lymphocytes Absolute: 2.4 10*3/uL (ref 0.7–3.1)
Lymphs: 43 %
MCH: 32.3 pg (ref 26.6–33.0)
MCHC: 33.8 g/dL (ref 31.5–35.7)
MCV: 96 fL (ref 79–97)
Monocytes Absolute: 0.5 10*3/uL (ref 0.1–0.9)
Monocytes: 9 %
Neutrophils Absolute: 2.5 10*3/uL (ref 1.4–7.0)
Neutrophils: 45 %
Platelets: 184 10*3/uL (ref 150–450)
RBC: 4.74 x10E6/uL (ref 3.77–5.28)
RDW: 12.9 % (ref 11.7–15.4)
WBC: 5.5 10*3/uL (ref 3.4–10.8)

## 2021-11-27 LAB — CMP14+EGFR
ALT: 21 IU/L (ref 0–32)
AST: 20 IU/L (ref 0–40)
Albumin/Globulin Ratio: 2.2 (ref 1.2–2.2)
Albumin: 4.4 g/dL (ref 3.9–4.9)
Alkaline Phosphatase: 96 IU/L (ref 44–121)
BUN/Creatinine Ratio: 24 (ref 12–28)
BUN: 20 mg/dL (ref 8–27)
Bilirubin Total: 0.2 mg/dL (ref 0.0–1.2)
CO2: 29 mmol/L (ref 20–29)
Calcium: 9.9 mg/dL (ref 8.7–10.3)
Chloride: 99 mmol/L (ref 96–106)
Creatinine, Ser: 0.82 mg/dL (ref 0.57–1.00)
Globulin, Total: 2 g/dL (ref 1.5–4.5)
Glucose: 96 mg/dL (ref 70–99)
Potassium: 4.3 mmol/L (ref 3.5–5.2)
Sodium: 141 mmol/L (ref 134–144)
Total Protein: 6.4 g/dL (ref 6.0–8.5)
eGFR: 79 mL/min/{1.73_m2} (ref >59)

## 2021-11-27 LAB — LIPID PANEL
Chol/HDL Ratio: 2.6 ratio (ref 0.0–4.4)
Cholesterol, Total: 153 mg/dL (ref 100–199)
HDL: 60 mg/dL (ref >39)
LDL Chol Calc (NIH): 74 mg/dL (ref 0–99)
Triglycerides: 108 mg/dL (ref 0–149)
VLDL Cholesterol Cal: 19 mg/dL (ref 5–40)

## 2021-11-28 ENCOUNTER — Other Ambulatory Visit: Payer: Self-pay | Admitting: Nurse Practitioner

## 2021-11-28 DIAGNOSIS — I10 Essential (primary) hypertension: Secondary | ICD-10-CM

## 2021-11-28 DIAGNOSIS — B009 Herpesviral infection, unspecified: Secondary | ICD-10-CM

## 2021-11-28 DIAGNOSIS — R7303 Prediabetes: Secondary | ICD-10-CM

## 2021-12-03 ENCOUNTER — Encounter: Payer: Self-pay | Admitting: Nurse Practitioner

## 2021-12-03 ENCOUNTER — Ambulatory Visit (INDEPENDENT_AMBULATORY_CARE_PROVIDER_SITE_OTHER): Payer: 59 | Admitting: Nurse Practitioner

## 2021-12-03 VITALS — BP 107/75 | HR 88 | Temp 97.9°F | Resp 20 | Ht 62.0 in | Wt 123.0 lb

## 2021-12-03 DIAGNOSIS — N644 Mastodynia: Secondary | ICD-10-CM | POA: Diagnosis not present

## 2021-12-03 MED ORDER — FLUDROCORTISONE ACETATE 0.1 MG PO TABS: 100.0000 ug | ORAL_TABLET | ORAL | 3 refills | Status: DC

## 2021-12-03 NOTE — Progress Notes (Signed)
   Subjective:    Patient ID: [name], female    DOB: 1956/02/09, 66 y.o.   MRN: [id number]   Chief Complaint: Pain under left nipple   HPI Patient comes in c/o pain below left nipple . Started over a month ago. Questionable lump felt. No nipple discharge.last mammogram was in July and was normal.    Review of Systems  Constitutional:  Negative for diaphoresis.  Eyes:  Negative for pain.  Respiratory:  Negative for shortness of breath.   Cardiovascular:  Negative for chest pain, palpitations and leg swelling.  Gastrointestinal:  Negative for abdominal pain.  Endocrine: Negative for polydipsia.  Skin:  Negative for rash.  Neurological:  Negative for dizziness, weakness and headaches.  Hematological:  Does not bruise/bleed easily.  All other systems reviewed and are negative.      Objective:   Physical Exam Vitals reviewed.  Constitutional:      Appearance: Normal appearance.  Cardiovascular:     Rate and Rhythm: Normal rate and regular rhythm.     Heart sounds: Normal heart sounds.  Pulmonary:     Breath sounds: Normal breath sounds.  Skin:    General: Skin is warm.  Neurological:     General: No focal deficit present.     Mental Status: She is alert and oriented to person, place, and time.  Psychiatric:        Mood and Affect: Mood normal.        Behavior: Behavior normal.    BP 107/75   Pulse 88   Temp 97.9 F (36.6 C) (Temporal)   Resp 20   Ht 5\' 2"  (1.575 m)   Wt 123 lb (55.8 kg)   SpO2 93%   BMI 22.50 kg/m         Assessment & Plan:  [name] in today with chief complaint of Pain under left nipple   1. Breast pain, left Vitamin e OTC Avoid caffeine - [name] BREAST COMPLETE UNI LEFT INC AXILLA    The above assessment and management plan was discussed with the patient. The patient verbalized understanding of and has agreed to the management plan. Patient is aware to call the clinic if symptoms persist or worsen. Patient is aware  when to return to the clinic for a follow-up visit. Patient educated on when it is appropriate to go to the emergency department.   [name] [name], FNP

## 2021-12-03 NOTE — Telephone Encounter (Signed)
Please call [facility] and find  out what they mean by speciaity phamacy. Which speciality pharmacy

## 2021-12-04 ENCOUNTER — Other Ambulatory Visit: Payer: Self-pay

## 2021-12-04 DIAGNOSIS — N644 Mastodynia: Secondary | ICD-10-CM

## 2021-12-05 ENCOUNTER — Encounter: Payer: Self-pay | Admitting: Family

## 2021-12-05 ENCOUNTER — Other Ambulatory Visit: Payer: Self-pay | Admitting: *Deleted

## 2021-12-05 ENCOUNTER — Telehealth (INDEPENDENT_AMBULATORY_CARE_PROVIDER_SITE_OTHER): Payer: 59 | Admitting: Family

## 2021-12-05 ENCOUNTER — Telehealth: Payer: Self-pay | Admitting: Nurse Practitioner

## 2021-12-05 DIAGNOSIS — Z20822 Contact with and (suspected) exposure to covid-19: Secondary | ICD-10-CM

## 2021-12-05 MED ORDER — MOLNUPIRAVIR EUA 200MG CAPSULE: 4.0000 | ORAL_CAPSULE | Freq: Two times a day (BID) | ORAL | 0 refills | Status: DC

## 2021-12-05 MED ORDER — BENZONATATE 200 MG PO CAPS: 200.0000 mg | ORAL_CAPSULE | Freq: Three times a day (TID) | ORAL | 1 refills | Status: DC | PRN

## 2021-12-05 MED ORDER — MOLNUPIRAVIR EUA 200MG CAPSULE: 4.0000 | ORAL_CAPSULE | Freq: Two times a day (BID) | ORAL | 0 refills | Status: AC

## 2021-12-05 NOTE — Telephone Encounter (Signed)
Please review and advise.

## 2021-12-05 NOTE — Progress Notes (Signed)
Virtual Visit Consent   [name], you are scheduled for a virtual visit with a [facility] provider today. Just as with appointments in the office, your consent must be obtained to participate. Your consent will be active for this visit and any virtual visit you may have with one of our providers in the next 365 days. If you have a MyChart account, a copy of this consent can be sent to you electronically.  As this is a virtual visit, video technology does not allow for your provider to perform a traditional examination. This may limit your provider's ability to fully assess your condition. If your provider identifies any concerns that need to be evaluated in person or the need to arrange testing (such as labs, EKG, etc.), we will make arrangements to do so. Although advances in technology are sophisticated, we cannot ensure that it will always work on either your end or our end. If the connection with a video visit is poor, the visit may have to be switched to a telephone visit. With either a video or telephone visit, we are not always able to ensure that we have a secure connection.  By engaging in this virtual visit, you consent to the provision of healthcare and authorize for your insurance to be billed (if applicable) for the services provided during this visit. Depending on your insurance coverage, you may receive a charge related to this service.  I need to obtain your verbal consent now. Are you willing to proceed with your visit today? [name] has provided verbal consent on 12/05/2021 for a virtual visit (video or telephone). Evelina Dun, FNP  Date: 12/05/2021 9:49 AM  Virtual Visit via Video Note   I, Evelina Dun, connected with  [name]  ([id number], 07/31/1955) on 12/05/21 at 10:25 AM EDT by a video-enabled telemedicine application and verified that I am speaking with the correct person using two identifiers.  Location: Patient: Virtual Visit Location Patient:  Home Provider: Virtual Visit Location Provider: Home Office   I discussed the limitations of evaluation and management by telemedicine and the availability of in person appointments. The patient expressed understanding and agreed to proceed.    History of Present Illness: [name] is a 66 y.o. who identifies as a female who was assigned female at birth, and is being seen today for exposed to [place] Sunday and started having fevers yesterday.  HPI: URI  This is a new problem. The current episode started yesterday. The maximum temperature recorded prior to her arrival was 100.4 - 100.9 F. Associated symptoms include congestion, coughing, diarrhea, ear pain, headaches, joint swelling, rhinorrhea, sinus pain, sneezing, a sore throat and wheezing. She has tried acetaminophen for the symptoms. The treatment provided mild relief.    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   Hypokalemia 11/25/2020   S/P laparoscopic fundoplication 89/21/1941   Status post total replacement of left hip 01/31/2020   Unilateral primary osteoarthritis, left hip 12/21/2019   S/P shoulder replacement, right 11/15/2019   Overweight (BMI 25.0-29.9) 04/15/2019   Diverticulosis 04/14/2019   Chronic diastolic CHF (congestive heart failure) ([place]) 04/14/2019   Unstable angina ([place]) 10/20/2017   Chronic pain 09/23/2016   Pre-diabetes 09/02/2016   Recurrent major depressive disorder, in partial remission ([place]) 07/14/2016   Seizures ([place]) 07/14/2016   GAD (generalized anxiety disorder) 07/14/2016  Insomnia 09/05/2014   Allergic rhinitis 09/05/2014   Cervical spondylosis without myelopathy 12/19/2013    Class: Chronic   Neural foraminal stenosis of cervical spine 12/19/2013   Cervical radiculitis 09/19/2013   Lumbosacral spondylosis without myelopathy 11/16/2012   Postlaminectomy  syndrome, lumbar region 11/16/2012   Herpes simplex virus (HSV) infection 10/31/2008   Hyperlipidemia with target LDL less than 100 10/31/2008   Essential hypertension 10/31/2008   GERD 10/31/2008   SPINAL STENOSIS OF LUMBAR REGION 10/31/2008   Myalgia 10/31/2008   Osteoporosis 10/31/2008   SPONDYLOLISTHESIS 10/31/2008   SYNCOPE 10/31/2008   Sleep apnea 10/31/2008    Allergies:  Allergies  Allergen Reactions   Codeine Other (See Comments)    "I will have a heart attack."   Morphine And Related Other (See Comments)    "It will cause me to have a heart attack."   Ambien [Zolpidem Tartrate] Nausea And Vomiting   Clonidine Derivatives Nausea And Vomiting    gerd - caused acid reflux per pt   Metformin And Related Nausea And Vomiting    cramping from metformin   Lyrica [Pregabalin] Swelling and Other (See Comments)    Weight gain   Neurontin [Gabapentin] Other (See Comments)    Causes elevated LFTs    Medications:  Current Outpatient Medications:    benzonatate (TESSALON) 200 MG capsule, Take 1 capsule (200 mg total) by mouth 3 (three) times daily as needed., Disp: 30 capsule, Rfl: 1   molnupiravir EUA (LAGEVRIO) 200 mg CAPS capsule, Take 4 capsules (800 mg total) by mouth 2 (two) times daily for 5 days., Disp: 40 capsule, Rfl: 0   Accu-Chek Softclix Lancets lancets, TEST BLOOD SUGAR UP TO 4 TIMES A DAY AS DIRECTED. R73.03, Disp: 400 each, Rfl: 3   acyclovir ointment (ZOVIRAX) 5 %, APPLY TOPICALLY EVERY 3 HOURS., Disp: 30 g, Rfl: 0   albuterol (VENTOLIN HFA) 108 (90 Base) MCG/ACT inhaler, INHALE 2 PUFFS EVERY 6 HOURS AS NEEDED FOR SHORTNESS OF BREATH AND WHEEZING., Disp: 6.7 g, Rfl: 2   Alcohol Swabs (B-D SINGLE USE SWABS REGULAR) PADS, USE 1 PAD DAILY WHEN CHECKING BLOOD SUGAR. R73.03, Disp: 100 each, Rfl: 3   aspirin EC 81 MG EC tablet, Take 1 tablet (81 mg total) by mouth daily. Swallow whole., Disp: 30 tablet, Rfl: 0   blood glucose meter kit and supplies, Dispense based on  patient and insurance preference. Use up to four times daily as directed. (FOR ICD-10 E10.9, E11.9)., Disp: 1 each, Rfl: 0   busPIRone (BUSPAR) 10 MG tablet, Take 1 tablet (10 mg total) by mouth 2 (two) times daily as needed., Disp: 60 tablet, Rfl: 5   butalbital-acetaminophen-caffeine (FIORICET) 50-325-40 MG tablet, TAKE 1 TABLET BY MOUTH EVERY 6 HOURS AS NEEDED FOR HEADACHE., Disp: 20 tablet, Rfl: 0   carvedilol (COREG) 3.125 MG tablet, TAKE 1/2 (0.5) TABLET TWICE A DAY WITH A MEAL; BLOOD PRESSURE OVER 150 BEFORE TAKING., Disp: 30 tablet, Rfl: 0   celecoxib (CELEBREX) 200 MG capsule, TAKE 1 CAPSULE BY MOUTH TWICE DAILY BETWEEN MEALS AS NEEDED., Disp: 60 capsule, Rfl: 0   dexlansoprazole (DEXILANT) 60 MG capsule, Take 1 capsule (60 mg total) by mouth daily., Disp: 90 capsule, Rfl: 1   diclofenac Sodium (VOLTAREN) 1 % GEL, APPLY 4 GRAMS TOPICALLY 4 TIMES A DAY. (Patient taking differently: Apply 4 g topically 4 (four) times daily as needed (pain).), Disp: 100 g, Rfl: 0   DULoxetine (CYMBALTA) 60 MG capsule, Take 1 capsule (60 mg total) by mouth  at bedtime., Disp: 90 capsule, Rfl: 1   escitalopram (LEXAPRO) 20 MG tablet, Take 1 tablet (20 mg total) by mouth daily., Disp: 90 tablet, Rfl: 1   estradiol (ESTRACE) 0.1 MG/GM vaginal cream, USE 1 APPLICATORFUL IN VAGINA AT BEDTIME AS DIRECTED., Disp: 42.5 g, Rfl: 0   FIBER PO, Take 1 tablet by mouth in the morning and at bedtime., Disp: , Rfl:    fludrocortisone (FLORINEF) 0.1 MG tablet, Take 1 tablet (100 mcg total) by mouth every other day., Disp: 45 tablet, Rfl: 3   fluticasone (FLONASE) 50 MCG/ACT nasal spray, USE 1 SPRAY IN EACH NOSTRIL ONCE DAILY. (Patient taking differently: Place 1 spray into both nostrils daily as needed for allergies.), Disp: 16 g, Rfl: 5   fluticasone furoate-vilanterol (BREO ELLIPTA) 100-25 MCG/ACT AEPB, Inhale 1 puff into the lungs daily., Disp: 1 each, Rfl: 11   furosemide (LASIX) 40 MG tablet, TAKE (1) TABLET DAILY AS NEEDED  FOR (EXCESSIVE) FLUID., Disp: 90 tablet, Rfl: 1   glucose blood (ACCU-CHEK GUIDE) test strip, Test BS up to 4 times a day Dx E11.9, Disp: 100 strip, Rfl: 11   hydrALAZINE (APRESOLINE) 25 MG tablet, TAKE 1 TABLET BY MOUTH 3 TIMES DAILY AS NEEDED (FOR SEVERE HYPERTENSION/SYSTOLIC NUMBER 601 OR GREATER)., Disp: 90 tablet, Rfl: 1   [START ON 02/25/2022] HYDROcodone-acetaminophen (NORCO) 7.5-325 MG tablet, Take 1 tablet by mouth in the morning, at noon, and at bedtime., Disp: 90 tablet, Rfl: 0   [START ON 01/26/2022] HYDROcodone-acetaminophen (NORCO) 7.5-325 MG tablet, Take 1 tablet by mouth in the morning, at noon, and at bedtime., Disp: 90 tablet, Rfl: 0   [START ON 12/27/2021] HYDROcodone-acetaminophen (NORCO) 7.5-325 MG tablet, Take 1 tablet by mouth in the morning, at noon, and at bedtime., Disp: 90 tablet, Rfl: 0   Ibuprofen-diphenhydrAMINE HCl (ADVIL PM) 200-25 MG CAPS, Take 2 capsules by mouth at bedtime., Disp: , Rfl:    ipratropium (ATROVENT) 0.02 % nebulizer solution, USE 1 VIAL (3ML) IN NEBULIZER EVERY 6 HOURS AS NEEDED FOR WHEEZING OR SHORTNESS OF BREATH., Disp: 150 mL, Rfl: 2   isosorbide mononitrate (IMDUR) 30 MG 24 hr tablet, Take 1.5 tablets (45 mg total) by mouth daily., Disp: 45 tablet, Rfl: 6   JARDIANCE 25 MG TABS tablet, TAKE 1 TABLET BY MOUTH DAILY BEFORE BREAKFAST., Disp: 30 tablet, Rfl: 0   lamoTRIgine (LAMICTAL) 150 MG tablet, Take 1 tablet (150 mg total) by mouth daily., Disp: 90 tablet, Rfl: 1   levETIRAcetam (KEPPRA) 500 MG tablet, Take 1 tablet (500 mg total) by mouth 2 (two) times daily., Disp: 180 tablet, Rfl: 1   levocetirizine (XYZAL) 5 MG tablet, TAKE 1 TABLET BY MOUTH EVERY MORNING., Disp: 30 tablet, Rfl: 0   lidocaine (LIDODERM) 5 %, Place 1 patch onto the skin daily. Remove & Discard patch within 12 hours or as directed by MD (Patient taking differently: Place 1 patch onto the skin daily as needed (pain). Remove & Discard patch within 12 hours or as directed by MD), Disp:  30 patch, Rfl: 3   lidocaine (XYLOCAINE) 5 % ointment, APPLY TO AFFECTED AREA 3 TIMES A DAY AS NEEDED FOR MILD OR MODERATE PAIN., Disp: 35.44 g, Rfl: 0   methocarbamol (ROBAXIN) 500 MG tablet, Take 1 tablet (500 mg total) by mouth every 8 (eight) hours as needed for muscle spasms., Disp: 30 tablet, Rfl: 0   methocarbamol (ROBAXIN) 750 MG tablet, Take 1 tablet (750 mg total) by mouth every 12 (twelve) hours as needed for muscle spasms.,  Disp: 30 tablet, Rfl: 0   nitroGLYCERIN (NITROSTAT) 0.4 MG SL tablet, PLACE ONE (1) TABLET UNDER TONGUE EVERY 5 MINUTES UP TO (3) DOSES AS NEEDED FOR CHEST PAIN., Disp: 25 tablet, Rfl: 0   ondansetron (ZOFRAN ODT) 4 MG disintegrating tablet, Take 1 tablet (4 mg total) by mouth every 8 (eight) hours as needed for nausea or vomiting., Disp: 20 tablet, Rfl: 0   ondansetron (ZOFRAN) 4 MG tablet, TAKE 1 TABLET BY MOUTH EVERY 8 HOURS AS NEEDED FOR NAUSEA AND VOMITING., Disp: 20 tablet, Rfl: 0   oxybutynin (DITROPAN-XL) 5 MG 24 hr tablet, Take 1 tablet (5 mg total) by mouth at bedtime., Disp: 90 tablet, Rfl: 1   Probiotic Product (PROBIOTIC PO), Take 1 capsule by mouth daily., Disp: , Rfl:    rosuvastatin (CRESTOR) 10 MG tablet, Take 1 tablet (10 mg total) by mouth daily., Disp: 90 tablet, Rfl: 1   traZODone (DESYREL) 150 MG tablet, Take 1 tablet (150 mg total) by mouth at bedtime., Disp: 90 tablet, Rfl: 1   valACYclovir (VALTREX) 1000 MG tablet, Take 1 tablet (1,000 mg total) by mouth 3 (three) times daily., Disp: 21 tablet, Rfl: 2   valACYclovir (VALTREX) 500 MG tablet, TAKE (1) TABLET TWICE DAILY., Disp: 180 tablet, Rfl: 1   valbenazine (INGREZZA) 40 MG capsule, Take 1 capsule (40 mg total) by mouth daily. (Patient not taking: Reported on 12/03/2021), Disp: 30 capsule, Rfl: 5  Observations/Objective: Patient is well-developed, well-nourished in no acute distress.  Resting comfortably  at home.  Head is normocephalic, atraumatic.  No labored breathing.  Speech is clear  and coherent with logical content.  Patient is alert and oriented at baseline.  Dry cough  Assessment and Plan: 1. Exposure to COVID-19 virus - molnupiravir EUA (LAGEVRIO) 200 mg CAPS capsule; Take 4 capsules (800 mg total) by mouth 2 (two) times daily for 5 days.  Dispense: 40 capsule; Refill: 0  2. Encounter by telehealth for suspected COVID-19 - molnupiravir EUA (LAGEVRIO) 200 mg CAPS capsule; Take 4 capsules (800 mg total) by mouth 2 (two) times daily for 5 days.  Dispense: 40 capsule; Refill: 0  COVID positive, rest, force fluids, tylenol as needed, Quarantine for at least 5 days and you are fever free, then must wear a mask out in public from day 0-07, report any worsening symptoms such as increased shortness of breath, swelling, or continued high fevers. Possible adverse effects discussed with antivirals.    Follow Up Instructions: I discussed the assessment and treatment plan with the patient. The patient was provided an opportunity to ask questions and all were answered. The patient agreed with the plan and demonstrated an understanding of the instructions.  A copy of instructions were sent to the patient via MyChart unless otherwise noted below.     The patient was advised to call back or seek an in-person evaluation if the symptoms worsen or if the condition fails to improve as anticipated.  Time:  I spent 7 minutes with the patient via telehealth technology discussing the above problems/concerns.    Evelina Dun, FNP

## 2021-12-05 NOTE — Telephone Encounter (Signed)
Prescription sent to pharmacy.

## 2021-12-06 NOTE — Telephone Encounter (Signed)
Called and notified patient.

## 2021-12-13 ENCOUNTER — Ambulatory Visit: Payer: 59 | Admitting: Nurse Practitioner

## 2021-12-19 NOTE — Telephone Encounter (Signed)
Patient is calling about her prior auth for ingerzza

## 2021-12-19 NOTE — Telephone Encounter (Signed)
Please let patient know.

## 2021-12-24 ENCOUNTER — Ambulatory Visit (HOSPITAL_COMMUNITY)
Admission: RE | Admit: 2021-12-24 | Discharge: 2021-12-24 | Disposition: A | Discharge status: Home / Self Care | Payer: 59 | Source: Ambulatory Visit | Attending: Nurse Practitioner | Admitting: Nurse Practitioner

## 2021-12-24 DIAGNOSIS — N644 Mastodynia: Secondary | ICD-10-CM | POA: Insufficient documentation

## 2021-12-27 ENCOUNTER — Encounter: Payer: Self-pay | Admitting: Orthopaedic Surgery

## 2021-12-27 ENCOUNTER — Other Ambulatory Visit: Payer: Self-pay

## 2021-12-27 DIAGNOSIS — Z96642 Presence of left artificial hip joint: Secondary | ICD-10-CM

## 2021-12-30 ENCOUNTER — Other Ambulatory Visit: Payer: Self-pay | Admitting: Cardiology

## 2021-12-30 ENCOUNTER — Other Ambulatory Visit: Payer: Self-pay | Admitting: Nurse Practitioner

## 2021-12-30 DIAGNOSIS — B009 Herpesviral infection, unspecified: Secondary | ICD-10-CM

## 2021-12-31 ENCOUNTER — Other Ambulatory Visit: Payer: Self-pay | Admitting: Nurse Practitioner

## 2021-12-31 DIAGNOSIS — G2401 Drug induced subacute dyskinesia: Secondary | ICD-10-CM

## 2021-12-31 MED ORDER — DEUTETRABENAZINE 6 MG PO TABS: 1.0000 | ORAL_TABLET | Freq: Two times a day (BID) | ORAL | 2 refills | Status: DC

## 2021-12-31 MED ORDER — TETRABENAZINE 12.5 MG PO TABS: 12.5000 mg | ORAL_TABLET | Freq: Every day | ORAL | 2 refills | Status: DC

## 2021-12-31 NOTE — Telephone Encounter (Signed)
Can try deutetrabenazine 2x a day

## 2022-01-08 ENCOUNTER — Encounter: Payer: Self-pay | Admitting: Cardiology

## 2022-01-15 ENCOUNTER — Ambulatory Visit (INDEPENDENT_AMBULATORY_CARE_PROVIDER_SITE_OTHER): Payer: 59

## 2022-01-15 DIAGNOSIS — Z23 Encounter for immunization: Secondary | ICD-10-CM

## 2022-01-20 ENCOUNTER — Telehealth: Payer: Self-pay

## 2022-01-20 NOTE — Telephone Encounter (Signed)
[facility] -   PA Case ID: PA-[id number] Need help?   Call us at [phone]  Status Sent to Plan today Drug Tetrabenazine 12.5MG  tablets Form [employer] Medicare Part D Electronic Prior Authorization Form (2017 NCPDP)

## 2022-01-23 ENCOUNTER — Ambulatory Visit: Payer: 59 | Admitting: Nurse Practitioner

## 2022-01-23 NOTE — Telephone Encounter (Signed)
PA denied. Appeal done via fax   Key: [facility] -   PA Case ID: [id number] Need help? Call us at [phone]  Outcome  Denied on October 25 Request Reference Number: [id number]. TETRABENAZIN TAB 12.5MG  is denied for not meeting the prior authorization requirement(s). Details of this decision are in the notice attached below or have been faxed to you. Drug Tetrabenazine 12.5MG  tablets Form [employer] Medicare Part D Electronic Prior Authorization Form (2017 NCPDP)

## 2022-01-27 ENCOUNTER — Ambulatory Visit
Admission: RE | Admit: 2022-01-27 | Discharge: 2022-01-27 | Disposition: A | Discharge status: Home / Self Care | Payer: 59 | Source: Ambulatory Visit | Attending: Orthopaedic Surgery | Admitting: Orthopaedic Surgery

## 2022-01-27 ENCOUNTER — Other Ambulatory Visit: Payer: Self-pay | Admitting: Nurse Practitioner

## 2022-01-27 ENCOUNTER — Other Ambulatory Visit: Payer: Self-pay | Admitting: Cardiology

## 2022-01-27 DIAGNOSIS — N941 Unspecified dyspareunia: Secondary | ICD-10-CM

## 2022-01-27 DIAGNOSIS — Z96642 Presence of left artificial hip joint: Secondary | ICD-10-CM

## 2022-01-29 ENCOUNTER — Other Ambulatory Visit: Payer: Self-pay | Admitting: Nurse Practitioner

## 2022-01-29 DIAGNOSIS — I10 Essential (primary) hypertension: Secondary | ICD-10-CM

## 2022-02-03 ENCOUNTER — Ambulatory Visit: Payer: 59 | Attending: Nurse Practitioner | Admitting: Neurology

## 2022-02-03 DIAGNOSIS — R0683 Snoring: Secondary | ICD-10-CM | POA: Diagnosis not present

## 2022-02-03 DIAGNOSIS — F5101 Primary insomnia: Secondary | ICD-10-CM | POA: Diagnosis present

## 2022-02-03 DIAGNOSIS — G4733 Obstructive sleep apnea (adult) (pediatric): Secondary | ICD-10-CM | POA: Diagnosis not present

## 2022-02-06 NOTE — Procedures (Signed)
[place] A. Merlene Laughter, MD     [email or web address]             NOCTURNAL POLYSOMNOGRAPHY   LOCATION: [facility]  Patient Name: Erica, [name] Date: 02/03/2022 Gender: Female D.O.B: 1955/11/12 Age (years): 2 Referring Provider: Ronnald Collum Height (inches): 62 Interpreting Physician: Phillips Odor MD, ABSM Weight (lbs): 123 RPSGT: Rosebud Poles BMI: 22 MRN: [id number] Neck Size: 13.00 CLINICAL INFORMATION Sleep Study Type: NPSG     Indication for sleep study: N/A     Epworth Sleepiness Score: 6     SLEEP STUDY TECHNIQUE As per the AASM Manual for the Scoring of Sleep and Associated Events v2.3 (April 2016) with a hypopnea requiring 4% desaturations.  The channels recorded and monitored were frontal, central and occipital EEG, electrooculogram (EOG), submentalis EMG (chin), nasal and oral airflow, thoracic and abdominal wall motion, anterior tibialis EMG, snore microphone, electrocardiogram, and pulse oximetry.  MEDICATIONS Medications self-administered by patient taken the night of the study : N/A  Current Outpatient Medications:    Accu-Chek Softclix Lancets lancets, TEST BLOOD SUGAR UP TO 4 TIMES A DAY AS DIRECTED. R73.03, Disp: 400 each, Rfl: 3   acyclovir ointment (ZOVIRAX) 5 %, APPLY TOPICALLY EVERY 3 HOURS., Disp: 30 g, Rfl: 0   albuterol (VENTOLIN HFA) 108 (90 Base) MCG/ACT inhaler, INHALE 2 PUFFS EVERY 6 HOURS AS NEEDED FOR SHORTNESS OF BREATH AND WHEEZING., Disp: 6.7 g, Rfl: 2   Alcohol Swabs (B-D SINGLE USE SWABS REGULAR) PADS, USE 1 PAD DAILY WHEN CHECKING BLOOD SUGAR. R73.03, Disp: 100 each, Rfl: 3   aspirin EC 81 MG EC tablet, Take 1 tablet (81 mg total) by mouth daily. Swallow whole., Disp: 30 tablet, Rfl: 0   benzonatate (TESSALON) 200 MG capsule, Take 1 capsule (200 mg total) by mouth 3 (three) times daily as needed., Disp: 30 capsule, Rfl: 1   blood glucose meter kit and supplies, Dispense based on patient and  insurance preference. Use up to four times daily as directed. (FOR ICD-10 E10.9, E11.9)., Disp: 1 each, Rfl: 0   busPIRone (BUSPAR) 10 MG tablet, Take 1 tablet (10 mg total) by mouth 2 (two) times daily as needed., Disp: 60 tablet, Rfl: 5   butalbital-acetaminophen-caffeine (FIORICET) 50-325-40 MG tablet, TAKE 1 TABLET BY MOUTH EVERY 6 HOURS AS NEEDED FOR HEADACHE., Disp: 20 tablet, Rfl: 0   carvedilol (COREG) 3.125 MG tablet, TAKE 1/2 (0.5) TABLET TWICE A DAY WITH A MEAL; BLOOD PRESSURE OVER 150 BEFORE TAKING., Disp: 30 tablet, Rfl: 1   celecoxib (CELEBREX) 200 MG capsule, TAKE 1 CAPSULE BY MOUTH TWICE DAILY BETWEEN MEALS AS NEEDED., Disp: 60 capsule, Rfl: 0   Deutetrabenazine 6 MG TABS, Take 1 tablet by mouth 2 (two) times daily., Disp: 60 tablet, Rfl: 2   dexlansoprazole (DEXILANT) 60 MG capsule, Take 1 capsule (60 mg total) by mouth daily., Disp: 90 capsule, Rfl: 1   diclofenac Sodium (VOLTAREN) 1 % GEL, APPLY 4 GRAMS TOPICALLY 4 TIMES A DAY. (Patient taking differently: Apply 4 g topically 4 (four) times daily as needed (pain).), Disp: 100 g, Rfl: 0   DULoxetine (CYMBALTA) 60 MG capsule, Take 1 capsule (60 mg total) by mouth at bedtime., Disp: 90 capsule, Rfl: 1   escitalopram (LEXAPRO) 20 MG tablet, Take 1 tablet (20 mg total) by mouth daily., Disp: 90 tablet, Rfl: 1   estradiol (ESTRACE) 0.1 MG/GM vaginal cream, USE 1 APPLICATORFUL IN VAGINA AT BEDTIME AS DIRECTED., Disp: 42.5 g, Rfl: 0   FIBER PO,  Take 1 tablet by mouth in the morning and at bedtime., Disp: , Rfl:    fludrocortisone (FLORINEF) 0.1 MG tablet, Take 1 tablet (100 mcg total) by mouth every other day., Disp: 45 tablet, Rfl: 3   fluticasone (FLONASE) 50 MCG/ACT nasal spray, USE 1 SPRAY IN EACH NOSTRIL ONCE DAILY. (Patient taking differently: Place 1 spray into both nostrils daily as needed for allergies.), Disp: 16 g, Rfl: 5   fluticasone furoate-vilanterol (BREO ELLIPTA) 100-25 MCG/ACT AEPB, Inhale 1 puff into the lungs daily.,  Disp: 1 each, Rfl: 11   furosemide (LASIX) 40 MG tablet, TAKE (1) TABLET DAILY AS NEEDED FOR (EXCESSIVE) FLUID., Disp: 90 tablet, Rfl: 1   glucose blood (ACCU-CHEK GUIDE) test strip, Test BS up to 4 times a day Dx E11.9, Disp: 100 strip, Rfl: 11   hydrALAZINE (APRESOLINE) 25 MG tablet, TAKE 1 TABLET BY MOUTH 3 TIMES DAILY AS NEEDED (FOR SEVERE HYPERTENSION/SYSTOLIC NUMBER 921 OR GREATER)., Disp: 90 tablet, Rfl: 3   [START ON 02/25/2022] HYDROcodone-acetaminophen (NORCO) 7.5-325 MG tablet, Take 1 tablet by mouth in the morning, at noon, and at bedtime., Disp: 90 tablet, Rfl: 0   HYDROcodone-acetaminophen (NORCO) 7.5-325 MG tablet, Take 1 tablet by mouth in the morning, at noon, and at bedtime., Disp: 90 tablet, Rfl: 0   Ibuprofen-diphenhydrAMINE HCl (ADVIL PM) 200-25 MG CAPS, Take 2 capsules by mouth at bedtime., Disp: , Rfl:    ipratropium (ATROVENT) 0.02 % nebulizer solution, USE 1 VIAL (3ML) IN NEBULIZER EVERY 6 HOURS AS NEEDED FOR WHEEZING OR SHORTNESS OF BREATH., Disp: 150 mL, Rfl: 2   isosorbide mononitrate (IMDUR) 30 MG 24 hr tablet, Take 1.5 tablets (45 mg total) by mouth daily., Disp: 45 tablet, Rfl: 6   JARDIANCE 25 MG TABS tablet, TAKE 1 TABLET BY MOUTH DAILY BEFORE BREAKFAST., Disp: 30 tablet, Rfl: 0   lamoTRIgine (LAMICTAL) 150 MG tablet, Take 1 tablet (150 mg total) by mouth daily., Disp: 90 tablet, Rfl: 1   levETIRAcetam (KEPPRA) 500 MG tablet, Take 1 tablet (500 mg total) by mouth 2 (two) times daily., Disp: 180 tablet, Rfl: 1   levocetirizine (XYZAL) 5 MG tablet, TAKE 1 TABLET BY MOUTH EVERY MORNING., Disp: 30 tablet, Rfl: 2   lidocaine (LIDODERM) 5 %, Place 1 patch onto the skin daily. Remove & Discard patch within 12 hours or as directed by MD (Patient taking differently: Place 1 patch onto the skin daily as needed (pain). Remove & Discard patch within 12 hours or as directed by MD), Disp: 30 patch, Rfl: 3   lidocaine (XYLOCAINE) 5 % ointment, APPLY TO AFFECTED AREA 3 TIMES A DAY AS  NEEDED FOR MILD OR MODERATE PAIN., Disp: 35.44 g, Rfl: 0   methocarbamol (ROBAXIN) 500 MG tablet, Take 1 tablet (500 mg total) by mouth every 8 (eight) hours as needed for muscle spasms., Disp: 30 tablet, Rfl: 0   methocarbamol (ROBAXIN) 750 MG tablet, Take 1 tablet (750 mg total) by mouth every 12 (twelve) hours as needed for muscle spasms., Disp: 30 tablet, Rfl: 0   nitroGLYCERIN (NITROSTAT) 0.4 MG SL tablet, PLACE ONE (1) TABLET UNDER TONGUE EVERY 5 MINUTES UP TO (3) DOSES AS NEEDED FOR CHEST PAIN., Disp: 25 tablet, Rfl: 1   ondansetron (ZOFRAN ODT) 4 MG disintegrating tablet, Take 1 tablet (4 mg total) by mouth every 8 (eight) hours as needed for nausea or vomiting., Disp: 20 tablet, Rfl: 0   ondansetron (ZOFRAN) 4 MG tablet, TAKE 1 TABLET BY MOUTH EVERY 8 HOURS AS NEEDED  FOR NAUSEA AND VOMITING., Disp: 20 tablet, Rfl: 0   oxybutynin (DITROPAN-XL) 5 MG 24 hr tablet, Take 1 tablet (5 mg total) by mouth at bedtime., Disp: 90 tablet, Rfl: 1   Probiotic Product (PROBIOTIC PO), Take 1 capsule by mouth daily., Disp: , Rfl:    rosuvastatin (CRESTOR) 10 MG tablet, Take 1 tablet (10 mg total) by mouth daily., Disp: 90 tablet, Rfl: 1   tetrabenazine (XENAZINE) 12.5 MG tablet, Take 1 tablet (12.5 mg total) by mouth daily., Disp: 30 tablet, Rfl: 2   traZODone (DESYREL) 150 MG tablet, Take 1 tablet (150 mg total) by mouth at bedtime., Disp: 90 tablet, Rfl: 1   valACYclovir (VALTREX) 1000 MG tablet, Take 1 tablet (1,000 mg total) by mouth 3 (three) times daily., Disp: 21 tablet, Rfl: 2   valACYclovir (VALTREX) 500 MG tablet, TAKE (1) TABLET TWICE DAILY., Disp: 180 tablet, Rfl: 1     SLEEP ARCHITECTURE The study was initiated at 10:36:08 PM and ended at 5:05:00 AM.  Sleep onset time was 10.2 minutes and the sleep efficiency was 90.7%. The total sleep time was 352.6 minutes.  Stage REM latency was 183.5 minutes.  The patient spent 3.69% of the night in stage N1 sleep, 62.57% in stage N2 sleep, 27.22% in  stage N3 and 6.5% in REM.  Alpha intrusion was absent.  Supine sleep was 38.30%.  RESPIRATORY PARAMETERS The overall apnea/hypopnea index (AHI) was 7.5 per hour. There were 13 total apneas, including 2 obstructive, 11 central and 0 mixed apneas. There were 31 hypopneas and 1 RERAs.  The AHI during Stage REM sleep was 7.8 per hour.  AHI while supine was 13.8 per hour.  The mean oxygen saturation was 89.60%. The minimum SpO2 during sleep was 82.00%.  moderate snoring was noted during this study.  CARDIAC DATA The 2 lead EKG demonstrated sinus rhythm. The mean heart rate was 54.70 beats per minute. Other EKG findings include: None.  LEG MOVEMENT DATA The total PLMS were 132 with a resulting PLMS index of 22.46. Associated arousal with leg movement index was 1.4.  IMPRESSIONS Mild obstructive sleep apnea.  The severity does not require positive pressure treatment. 2.   Mild periodic limb movements of sleep occurred during the study. No significant associated arousals.     Delano Metz, MD Diplomate, American Board of Sleep Medicine.  ELECTRONICALLY SIGNED ON:  02/06/2022, 9:07 AM [place] PH: [phone]   FX: [phone] [place]

## 2022-02-19 ENCOUNTER — Encounter: Payer: Self-pay | Admitting: Orthopaedic Surgery

## 2022-02-19 ENCOUNTER — Ambulatory Visit (INDEPENDENT_AMBULATORY_CARE_PROVIDER_SITE_OTHER): Payer: 59 | Admitting: Orthopaedic Surgery

## 2022-02-19 DIAGNOSIS — Z96642 Presence of left artificial hip joint: Secondary | ICD-10-CM

## 2022-02-19 DIAGNOSIS — M25552 Pain in left hip: Secondary | ICD-10-CM | POA: Diagnosis not present

## 2022-02-19 DIAGNOSIS — M7062 Trochanteric bursitis, left hip: Secondary | ICD-10-CM

## 2022-02-19 MED ORDER — LIDOCAINE HCL 1 % IJ SOLN
3.0000 mL | INTRAMUSCULAR | Status: AC | PRN
  Administered 2022-02-19: 3 mL

## 2022-02-19 MED ORDER — METHYLPREDNISOLONE ACETATE 40 MG/ML IJ SUSP
40.0000 mg | INTRAMUSCULAR | Status: AC | PRN
  Administered 2022-02-19: 40 mg via INTRA_ARTICULAR

## 2022-02-19 NOTE — Progress Notes (Signed)
The patient is now 2 years out from a left total hip arthroplasty.  She is 66 years old.  She states that she still cannot lay on that hip at night.  X-rays have been normal of her left hip replacement.  She walks with a normal gait.  We recently sent her for an MRI of the left hip.  The MRI is essentially normal.  There was only trace fluid in the gluteus minimus tendon but this was trace.  There is no fluid around the bursa.  There is no edema around the joint replacement itself or any complicating features that can be seen on the replacement at all.  On exam her hip moves smoothly and fluidly.  There is no swelling around her left hip.  The incisions healed nicely.  She has pain over the trochanteric area.  She is a thin individual.  We felt it was worthwhile to try at least another steroid injection on the hip trochanteric area at her point of maximum pain.  She agreed to this and tolerated it well.  We can always repeat this in 3 months if needed.  I am at a loss of what else to recommend other than aggressive outpatient physical therapy or even dry needling and acupuncture.      Procedure Note  Patient: [name]             Date of Birth: 06-24-1955           MRN: [id number]             Visit Date: 02/19/2022  Procedures: Visit Diagnoses:  1. History of left hip replacement   2. Pain of left hip   3. Trochanteric bursitis, left hip     Large Joint Inj: L greater trochanter on 02/19/2022 10:11 AM Indications: pain and diagnostic evaluation Details: 22 G 1.5 in needle, lateral approach  Arthrogram: No  Medications: 3 mL lidocaine 1 %; 40 mg methylPREDNISolone acetate 40 MG/ML Outcome: tolerated well, no immediate complications Procedure, treatment alternatives, risks and benefits explained, specific risks discussed. Consent was given by the patient. Immediately prior to procedure a time out was called to verify the correct patient, procedure, equipment, support staff and  site/side marked as required. Patient was prepped and draped in the usual sterile fashion.

## 2022-02-27 ENCOUNTER — Other Ambulatory Visit: Payer: Self-pay | Admitting: Nurse Practitioner

## 2022-02-28 ENCOUNTER — Other Ambulatory Visit: Payer: Self-pay | Admitting: Nurse Practitioner

## 2022-02-28 ENCOUNTER — Encounter: Payer: Self-pay | Admitting: Cardiology

## 2022-02-28 ENCOUNTER — Ambulatory Visit: Payer: 59 | Attending: Cardiology | Admitting: Cardiology

## 2022-02-28 VITALS — BP 124/86 | HR 59 | Ht 62.0 in | Wt 120.0 lb

## 2022-02-28 DIAGNOSIS — R0789 Other chest pain: Secondary | ICD-10-CM

## 2022-02-28 DIAGNOSIS — B009 Herpesviral infection, unspecified: Secondary | ICD-10-CM

## 2022-02-28 DIAGNOSIS — G909 Disorder of the autonomic nervous system, unspecified: Secondary | ICD-10-CM | POA: Diagnosis not present

## 2022-02-28 DIAGNOSIS — R7303 Prediabetes: Secondary | ICD-10-CM

## 2022-02-28 DIAGNOSIS — R55 Syncope and collapse: Secondary | ICD-10-CM | POA: Diagnosis not present

## 2022-02-28 DIAGNOSIS — I10 Essential (primary) hypertension: Secondary | ICD-10-CM | POA: Diagnosis not present

## 2022-02-28 DIAGNOSIS — E782 Mixed hyperlipidemia: Secondary | ICD-10-CM

## 2022-02-28 DIAGNOSIS — N941 Unspecified dyspareunia: Secondary | ICD-10-CM

## 2022-02-28 NOTE — Patient Instructions (Signed)
Medication Instructions:  Continue all current medications.   Labwork: none  Testing/Procedures: none  Follow-Up: 6 months   Any Other Special Instructions Will Be Listed Below (If Applicable).   If you need a refill on your cardiac medications before your next appointment, please call your pharmacy.  

## 2022-02-28 NOTE — Progress Notes (Signed)
Clinical Summary [name] is a 66 y.o.female seen today for follow up of the following medical problems.    1. 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 Issues getting arranged with transporation.          - we changed florinef to 0.68m every other day due to high bp's    -reports overall bp's controlled, rare highs and lows and no significant symptoms.      2. Chest pain - several year history of chest pain - normal cath in 2013, normal stress test 2013 - [facility]. Negative workup for ACS. She was discharged with an out patient Lexiscan which showed no evidence of ischemia.   -  echo 11/2014 LVEF 55-60%, no WMAs.  - 09/2017 nuclear stress no ischemia.  - started on imdur by pcp with improved symptoms.      12/2018 admission with chest pain - no objective evidence of ischemia by EKG or enzymes this admission - echo LVE 634-28% grade I diastolic dysfunction  -  symptoms were noncardiac in description. Lasting constantly x 6 days, worst with deep breathing.   -chronic ches tpain well controlled, has done better on imdur.    3. Hyperlipidemia -12/2020 TC 125 TG 81 HDL 43 LDL 66 - 06/2021 TC 1523 TG 83 HDL 63 LDL 73 - 10/2021 TC 153 TG 108 HDL 60 LDL 74 - she is on crestor   4. 05/2021 shoulder replacement   [place]  Past Medical History:  Diagnosis Date   Allergy    Anemia    Anginal pain ([place]    last time    Anxiety    Arthritis    RHEUMATOID   Asthma    Bipolar 1 disorder (HCC)    Blood transfusion without reported diagnosis    Cataracts, bilateral 07/2017   CHF (congestive heart failure) (HCC)    COPD  (chronic obstructive pulmonary disease) (HCC)    Coronary artery disease    reported hx of "MI";  Echo 2009 with normal LVF;  Myoview 05/2011: no ischemia   Depression    Diabetes mellitus without complication (HCC)    Dyslipidemia    Dysrhythmia    SVT   Esophageal stricture    Fibromyalgia    GERD (gastroesophageal reflux disease)    H/O hiatal hernia    Head injury, unspecified    Headache    migraines   Herpes simplex infection    History of kidney stones    History of loop recorder    Managed by Dr. GCrissie Sickles  Hyperlipidemia    Hypertension    Insomnia    Myocardial infarction ([facility]    age 66  Osteoporosis    Pneumonia    hx   Seizures ([place]    Shortness of breath    Sleep apnea    ? neg   Spinal stenosis of lumbar region    Spondylolisthesis    Status post placement of implantable loop recorder    Supraventricular tachycardia    Syncope and collapse    s/p ILR; no arhythmogenic cause identified   UTI (lower urinary tract infection)  Allergies  Allergen Reactions   Codeine Other (See Comments)    "I will have a heart attack."   Morphine And Related Other (See Comments)    "It will cause me to have a heart attack."   Ambien [Zolpidem Tartrate] Nausea And Vomiting   Clonidine Derivatives Nausea And Vomiting    gerd - caused acid reflux per pt   Metformin And Related Nausea And Vomiting    cramping from metformin   Lyrica [Pregabalin] Swelling and Other (See Comments)    Weight gain   Neurontin [Gabapentin] Other (See Comments)    Causes elevated LFTs      Current Outpatient Medications  Medication Sig Dispense Refill   Accu-Chek Softclix Lancets lancets TEST BLOOD SUGAR UP TO 4 TIMES A DAY AS DIRECTED. R73.03 400 each 3   acyclovir ointment (ZOVIRAX) 5 % APPLY TOPICALLY EVERY 3 HOURS. 30 g 0   albuterol (VENTOLIN HFA) 108 (90 Base) MCG/ACT inhaler INHALE 2 PUFFS EVERY 6 HOURS AS NEEDED FOR SHORTNESS OF BREATH AND WHEEZING. 6.7 g 2   Alcohol  Swabs (B-D SINGLE USE SWABS REGULAR) PADS USE 1 PAD DAILY WHEN CHECKING BLOOD SUGAR. R73.03 100 each 3   aspirin EC 81 MG EC tablet Take 1 tablet (81 mg total) by mouth daily. Swallow whole. 30 tablet 0   benzonatate (TESSALON) 200 MG capsule Take 1 capsule (200 mg total) by mouth 3 (three) times daily as needed. 30 capsule 1   blood glucose meter kit and supplies Dispense based on patient and insurance preference. Use up to four times daily as directed. (FOR ICD-10 E10.9, E11.9). 1 each 0   busPIRone (BUSPAR) 10 MG tablet Take 1 tablet (10 mg total) by mouth 2 (two) times daily as needed. 60 tablet 5   butalbital-acetaminophen-caffeine (FIORICET) 50-325-40 MG tablet TAKE 1 TABLET BY MOUTH EVERY 6 HOURS AS NEEDED FOR HEADACHE. 20 tablet 0   carvedilol (COREG) 3.125 MG tablet TAKE 1/2 (0.5) TABLET TWICE A DAY WITH A MEAL; BLOOD PRESSURE OVER 150 BEFORE TAKING. 30 tablet 1   celecoxib (CELEBREX) 200 MG capsule TAKE 1 CAPSULE BY MOUTH TWICE DAILY BETWEEN MEALS AS NEEDED. 60 capsule 0   Deutetrabenazine 6 MG TABS Take 1 tablet by mouth 2 (two) times daily. 60 tablet 2   dexlansoprazole (DEXILANT) 60 MG capsule Take 1 capsule (60 mg total) by mouth daily. 90 capsule 1   diclofenac Sodium (VOLTAREN) 1 % GEL APPLY 4 GRAMS TOPICALLY 4 TIMES A DAY. (Patient taking differently: Apply 4 g topically 4 (four) times daily as needed (pain).) 100 g 0   DULoxetine (CYMBALTA) 60 MG capsule Take 1 capsule (60 mg total) by mouth at bedtime. 90 capsule 1   escitalopram (LEXAPRO) 20 MG tablet Take 1 tablet (20 mg total) by mouth daily. 90 tablet 1   estradiol (ESTRACE) 0.1 MG/GM vaginal cream USE 1 APPLICATORFUL IN VAGINA AT BEDTIME AS DIRECTED. 42.5 g 0   FIBER PO Take 1 tablet by mouth in the morning and at bedtime.     fludrocortisone (FLORINEF) 0.1 MG tablet Take 1 tablet (100 mcg total) by mouth every other day. 45 tablet 3   fluticasone (FLONASE) 50 MCG/ACT nasal spray USE 1 SPRAY IN EACH NOSTRIL ONCE DAILY.  (Patient taking differently: Place 1 spray into both nostrils daily as needed for allergies.) 16 g 5   fluticasone furoate-vilanterol (BREO ELLIPTA) 100-25 MCG/ACT AEPB Inhale 1 puff into the lungs daily. 1 each 11   furosemide (LASIX) 40 MG tablet  TAKE (1) TABLET DAILY AS NEEDED FOR (EXCESSIVE) FLUID. 90 tablet 1   glucose blood (ACCU-CHEK GUIDE) test strip Test BS up to 4 times a day Dx E11.9 100 strip 11   hydrALAZINE (APRESOLINE) 25 MG tablet TAKE 1 TABLET BY MOUTH 3 TIMES DAILY AS NEEDED (FOR SEVERE HYPERTENSION/SYSTOLIC NUMBER 892 OR GREATER). 90 tablet 3   HYDROcodone-acetaminophen (NORCO) 7.5-325 MG tablet Take 1 tablet by mouth in the morning, at noon, and at bedtime. 90 tablet 0   Ibuprofen-diphenhydrAMINE HCl (ADVIL PM) 200-25 MG CAPS Take 2 capsules by mouth at bedtime.     ipratropium (ATROVENT) 0.02 % nebulizer solution USE 1 VIAL (3ML) IN NEBULIZER EVERY 6 HOURS AS NEEDED FOR WHEEZING OR SHORTNESS OF BREATH. 150 mL 2   isosorbide mononitrate (IMDUR) 30 MG 24 hr tablet Take 1.5 tablets (45 mg total) by mouth daily. 45 tablet 6   JARDIANCE 25 MG TABS tablet TAKE 1 TABLET BY MOUTH DAILY BEFORE BREAKFAST. 30 tablet 0   lamoTRIgine (LAMICTAL) 150 MG tablet Take 1 tablet (150 mg total) by mouth daily. 90 tablet 1   levETIRAcetam (KEPPRA) 500 MG tablet Take 1 tablet (500 mg total) by mouth 2 (two) times daily. 180 tablet 1   levocetirizine (XYZAL) 5 MG tablet TAKE 1 TABLET BY MOUTH EVERY MORNING. 30 tablet 2   lidocaine (LIDODERM) 5 % Place 1 patch onto the skin daily. Remove & Discard patch within 12 hours or as directed by MD (Patient taking differently: Place 1 patch onto the skin daily as needed (pain). Remove & Discard patch within 12 hours or as directed by MD) 30 patch 3   lidocaine (XYLOCAINE) 5 % ointment APPLY TO AFFECTED AREA 3 TIMES A DAY AS NEEDED FOR MILD OR MODERATE PAIN. 35.44 g 0   methocarbamol (ROBAXIN) 500 MG tablet Take 1 tablet (500 mg total) by mouth every 8 (eight)  hours as needed for muscle spasms. 30 tablet 0   methocarbamol (ROBAXIN) 750 MG tablet Take 1 tablet (750 mg total) by mouth every 12 (twelve) hours as needed for muscle spasms. 30 tablet 0   nitroGLYCERIN (NITROSTAT) 0.4 MG SL tablet PLACE ONE (1) TABLET UNDER TONGUE EVERY 5 MINUTES UP TO (3) DOSES AS NEEDED FOR CHEST PAIN. 25 tablet 1   ondansetron (ZOFRAN ODT) 4 MG disintegrating tablet Take 1 tablet (4 mg total) by mouth every 8 (eight) hours as needed for nausea or vomiting. 20 tablet 0   ondansetron (ZOFRAN) 4 MG tablet TAKE 1 TABLET BY MOUTH EVERY 8 HOURS AS NEEDED FOR NAUSEA AND VOMITING. 20 tablet 0   oxybutynin (DITROPAN-XL) 5 MG 24 hr tablet Take 1 tablet (5 mg total) by mouth at bedtime. 90 tablet 1   Probiotic Product (PROBIOTIC PO) Take 1 capsule by mouth daily.     rosuvastatin (CRESTOR) 10 MG tablet Take 1 tablet (10 mg total) by mouth daily. 90 tablet 1   tetrabenazine (XENAZINE) 12.5 MG tablet Take 1 tablet (12.5 mg total) by mouth daily. 30 tablet 2   traZODone (DESYREL) 150 MG tablet Take 1 tablet (150 mg total) by mouth at bedtime. 90 tablet 1   valACYclovir (VALTREX) 1000 MG tablet Take 1 tablet (1,000 mg total) by mouth 3 (three) times daily. 21 tablet 2   valACYclovir (VALTREX) 500 MG tablet TAKE (1) TABLET TWICE DAILY. 180 tablet 1   No current facility-administered medications for this visit.     Past Surgical History:  Procedure Laterality Date   BACK SURGERY  BREAST EXCISIONAL BIOPSY Right    1990s   BREAST SURGERY     lumpectomy   CARDIAC CATHETERIZATION  10/06/2011   CATARACT EXTRACTION W/PHACO Right 07/31/2017   Procedure: CATARACT EXTRACTION PHACO AND INTRAOCULAR LENS PLACEMENT ([place]);  Surgeon: [name], MD;  Location: [facility];  Service: Ophthalmology;  Laterality: Right;  CDE: 2.33   CATARACT EXTRACTION W/PHACO Left 08/14/2017   Procedure: CATARACT EXTRACTION PHACO AND INTRAOCULAR LENS PLACEMENT ([facility]);  Surgeon: [name], MD;  Location: [facility];  Service: Ophthalmology;  Laterality: Left;  CDE: 2.74   CHOLECYSTECTOMY     CYSTOSCOPY     stone   DIAGNOSTIC LAPAROSCOPY     laparoscopic cholecystectomy   DOPPLER ECHOCARDIOGRAPHY  2009   ESOPHAGOGASTRODUODENOSCOPY N/A 10/31/2020   Procedure: ESOPHAGOGASTRODUODENOSCOPY (EGD);  Surgeon: Greer Pickerel, MD;  Location: [facility];  Service: General;  Laterality: N/A;   EYE SURGERY Bilateral 08/14/2017   cataract removal   head up tilt table testing  06/15/2007   [name]   HEMORRHOID SURGERY     HERNIA REPAIR     insertion of implatable loop recorder  08/11/2007   [name]   POSTERIOR CERVICAL FUSION/FORAMINOTOMY N/A 12/19/2013   Procedure: RIGHT C3-4.C4-5 AND C5-6 FORAMINOTOMIES;  Surgeon: [name], MD;  Location: [place];  Service: Orthopedics;  Laterality: N/A;   TOTAL HIP ARTHROPLASTY Left 01/31/2020   Procedure: LEFT TOTAL HIP ARTHROPLASTY ANTERIOR APPROACH;  Surgeon: Mcarthur Rossetti, MD;  Location: [place];  Service: Orthopedics;  Laterality: Left;   TOTAL SHOULDER ARTHROPLASTY Right 11/15/2019   Procedure: RIGHT TOTAL SHOULDER ARTHROPLASTY;  Surgeon: Meredith Pel, MD;  Location: [facility];  Service: Orthopedics;  Laterality: Right;   TOTAL SHOULDER REVISION Right 06/11/2021   Procedure: RIGHT SHOULDER CONVERSION TOTAL SHOULDER ARTHROPLASTY to REVERSE TOTAL SHOULDER ARTHROPLASTY;  Surgeon: Meredith Pel, MD;  Location: [place];  Service: Orthopedics;  Laterality: Right;   TUBAL LIGATION     UPPER GASTROINTESTINAL ENDOSCOPY     XI ROBOTIC ASSISTED HIATAL HERNIA REPAIR N/A 10/31/2020   Procedure: XI ROBOTIC ASSISTED HIATAL HERNIA REPAIR WITH PARTIAL FUNDOPLICATION;  Surgeon: Greer Pickerel, MD;  Location: [facility];  Service: General;  Laterality: N/A;     Allergies  Allergen Reactions   Codeine Other (See Comments)    "I will have a heart attack."   Morphine And Related Other (See Comments)    "It will cause me to have a heart attack."   Ambien [Zolpidem  Tartrate] Nausea And Vomiting   Clonidine Derivatives Nausea And Vomiting    gerd - caused acid reflux per pt   Metformin And Related Nausea And Vomiting    cramping from metformin   Lyrica [Pregabalin] Swelling and Other (See Comments)    Weight gain   Neurontin [Gabapentin] Other (See Comments)    Causes elevated LFTs       Family History  Problem Relation Age of Onset   Heart attack Father    Mental illness Father    Mental illness Mother    Heart attack Brother        stents   Alcohol abuse Brother    Heart disease Brother    Drug abuse Brother    Diabetes Brother    Colon cancer Maternal Aunt    Cirrhosis Brother    Stomach cancer Neg Hx    Esophageal cancer Neg Hx    Rectal cancer Neg Hx      Social History Ms. Burstein reports that she  has never smoked. She has never used smokeless tobacco. Ms. Gardin reports no history of alcohol use.   Review of Systems CONSTITUTIONAL: No weight loss, fever, chills, weakness or fatigue.  HEENT: Eyes: No visual loss, blurred vision, double vision or yellow sclerae.No hearing loss, sneezing, congestion, runny nose or sore throat.  SKIN: No rash or itching.  CARDIOVASCULAR: per hpi RESPIRATORY: No shortness of breath, cough or sputum.  GASTROINTESTINAL: No anorexia, nausea, vomiting or diarrhea. No abdominal pain or blood.  GENITOURINARY: No burning on urination, no polyuria NEUROLOGICAL: No headache, dizziness, syncope, paralysis, ataxia, numbness or tingling in the extremities. No change in bowel or bladder control.  MUSCULOSKELETAL: No muscle, back pain, joint pain or stiffness.  LYMPHATICS: No enlarged nodes. No history of splenectomy.  PSYCHIATRIC: No history of depression or anxiety.  ENDOCRINOLOGIC: No reports of sweating, cold or heat intolerance. No polyuria or polydipsia.  [name]   Physical Examination Today's Vitals   02/28/22 1508  BP: 124/86  Pulse: (!) 59  SpO2: 98%  Weight: 120 lb (54.4 kg)  Height: _0   (1.575 m)   Body mass index is 21.95 kg/m.  Gen: resting comfortably, no acute distress HEENT: no scleral icterus, pupils equal round and reactive, no palptable cervical adenopathy,  CV: RRR, no m/rg, no jvd Resp: Clear to auscultation bilaterally GI: abdomen is soft, non-tender, non-distended, normal bowel sounds, no hepatosplenomegaly MSK: extremities are warm, no edema.  Skin: warm, no rash Neuro:  no focal deficits Psych: appropriate affect   Diagnostic Studies 05/2011 Lexiscan MPI No ischemia     Jan 2009 Echo LEFT VENTRICLE: - Left ventricular size was normal. - Overall left ventricular systolic function was normal. - There were no left ventricular regional wall motion abnormalities. - Left ventricular wall thickness was normal.  AORTIC VALVE: - The aortic valve was trileaflet. - Aortic valve thickness was normal.  AORTA: - The aortic root was normal in size. - The aortic arch was normal.  MITRAL VALVE: - Mitral valve structure was normal.  Doppler interpretation(s): - There was trivial mitral valvular regurgitation.  LEFT ATRIUM: - Left atrial size was normal.  PULMONARY VEINS: - The pulmonary veins were grossly normal.  RIGHT VENTRICLE: - Right ventricular size was normal. - Right ventricular systolic function was normal. - Right ventricular wall thickness was normal.  PULMONIC VALVE: - The structure of the pulmonic valve appeared to be normal.  TRICUSPID VALVE: - The tricuspid valve structure was normal.  Doppler interpretation(s): - There was no significant tricuspid valvular regurgitation.  PULMONARY ARTERY: - The pulmonary artery was normal size.  RIGHT ATRIUM: - Right atrial size was normal.  SYSTEMIC VEINS: - The inferior vena cava was normal.  PERICARDIUM: - There was no pericardial effusion.  ---------------------------------------------------------------  SUMMARY - Overall left ventricular systolic function was normal.  There were no left ventricular regional wall motion abnormalities. - The pulmonary veins were grossly normal.     09/2011 Cath Hemodynamic Findings:   Central aortic pressure: 108/58   Left ventricular pressure: 107/10/14   Angiographic Findings:   Left main: No obstructive disease noted.   Left Anterior Descending Artery: Moderate to large sized vessel that courses to the apex. There is a moderate sized diagonal Jestine Bicknell. No obstructive disease noted.   Circumflex Artery: Large, dominant artery with moderate sized first obtuse marginal Aramis Weil and left sided posterolateral Satchel Heidinger with no disease noted.   Right Coronary Artery: Small, non-dominant vessel with no disease noted.   Left Ventricular Angiogram:LVEF=65%.  Impression:   1. No angiographic evidence of CAD   2. Normal LV systolic function   3. Non-cardiac chest pain   Recommendations: No further ischemic workup.   Complications: None. The patient tolerated the procedure well.     11/2014 echo Study Conclusions  - Left ventricle: The cavity size was normal. Systolic function was   normal. The estimated ejection fraction was in the range of 55%   to 60%. Wall motion was normal; there were no regional wall   motion abnormalities. There was an increased relative   contribution of atrial contraction to ventricular filling.   Doppler parameters are consistent with abnormal left ventricular   relaxation (grade 1 diastolic dysfunction). - Mitral valve: There was trivial regurgitation. - Tricuspid valve: There was trivial regurgitation. - Pulmonic valve: There was trivial regurgitation.      09/2017 nuclear stress There was no ST segment deviation noted during stress. Nonspecific T wave flattening in aVL and V2 seen throughout study. Defect 1: There is a medium defect of mild severity present in the mid inferior, apical inferior and apical lateral location. This appears to be due to soft tissue attenuation given normal regional wall  motion. No ischemic territories. This is a low risk study. Nuclear stress EF: 65%.    Assessment and Plan   1. Syncope/Autonomic dysfunction/HTN - difficult balance between severe symptomatic both hypotension and HTN.  -unorthodox regimen but through trial and error has done fairly well for her all things considred and we have continued - we will continue currnt meds.      2. Chest pain - long history of symptoms with multiple negative stress tests, most recently 09/2017  Lexiscan was negative. Cath 2013 with patent vessels. Symptoms did improve with imdur -no symptoms, continue to monitor   3. Hyperlipidemia - at goal, continue current meds  F/u 6 months    Arnoldo Lenis, M.D.

## 2022-03-17 ENCOUNTER — Telehealth (INDEPENDENT_AMBULATORY_CARE_PROVIDER_SITE_OTHER): Payer: 59 | Admitting: Family

## 2022-03-17 ENCOUNTER — Encounter: Payer: Self-pay | Admitting: Family

## 2022-03-17 ENCOUNTER — Other Ambulatory Visit: Payer: Self-pay | Admitting: Family Medicine

## 2022-03-17 DIAGNOSIS — J208 Acute bronchitis due to other specified organisms: Secondary | ICD-10-CM | POA: Diagnosis not present

## 2022-03-17 DIAGNOSIS — B9689 Other specified bacterial agents as the cause of diseases classified elsewhere: Secondary | ICD-10-CM | POA: Diagnosis not present

## 2022-03-17 MED ORDER — PROMETHAZINE-DM 6.25-15 MG/5ML PO SYRP: 5.0000 mL | ORAL_SOLUTION | Freq: Three times a day (TID) | ORAL | 0 refills | Status: DC | PRN

## 2022-03-17 MED ORDER — DOXYCYCLINE HYCLATE 100 MG PO TABS: 100.0000 mg | ORAL_TABLET | Freq: Two times a day (BID) | ORAL | 0 refills | Status: DC

## 2022-03-17 MED ORDER — PREDNISONE 10 MG (21) PO TBPK: ORAL_TABLET | ORAL | 0 refills | Status: DC

## 2022-03-17 NOTE — Progress Notes (Signed)
Virtual Visit Consent   [name], you are scheduled for a virtual visit with a [facility] provider today. Just as with appointments in the office, your consent must be obtained to participate. Your consent will be active for this visit and any virtual visit you may have with one of our providers in the next 365 days. If you have a MyChart account, a copy of this consent can be sent to you electronically.  As this is a virtual visit, video technology does not allow for your provider to perform a traditional examination. This may limit your provider's ability to fully assess your condition. If your provider identifies any concerns that need to be evaluated in person or the need to arrange testing (such as labs, EKG, etc.), we will make arrangements to do so. Although advances in technology are sophisticated, we cannot ensure that it will always work on either your end or our end. If the connection with a video visit is poor, the visit may have to be switched to a telephone visit. With either a video or telephone visit, we are not always able to ensure that we have a secure connection.  By engaging in this virtual visit, you consent to the provision of healthcare and authorize for your insurance to be billed (if applicable) for the services provided during this visit. Depending on your insurance coverage, you may receive a charge related to this service.  I need to obtain your verbal consent now. Are you willing to proceed with your visit today? [name] has provided verbal consent on 03/17/2022 for a virtual visit (video or telephone). Evelina Dun, FNP  Date: 03/17/2022 2:35 PM  Virtual Visit via Video Note   I, Evelina Dun, connected with  [name]  ([id number], Sep 04, 1955) on 03/17/22 at  2:25 PM EST by a video-enabled telemedicine application and verified that I am speaking with the correct person using two identifiers.  Location: Patient: Virtual Visit Location  Patient: Home Provider: Virtual Visit Location Provider: Office/Clinic   I discussed the limitations of evaluation and management by telemedicine and the availability of in person appointments. The patient expressed understanding and agreed to proceed.    History of Present Illness: [name] is a 66 y.o. who identifies as a female who was assigned female at birth, and is being seen today for cough and sore throat.  HPI: Cough This is a new problem. The current episode started in the past 7 days. The problem has been gradually worsening. The problem occurs every few minutes. The cough is Non-productive. Associated symptoms include a fever, headaches, myalgias, nasal congestion, postnasal drip, a sore throat, shortness of breath and wheezing. Pertinent negatives include no chills, ear congestion or ear pain. She has tried rest and OTC cough suppressant for the symptoms. The treatment provided mild relief.    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   Hypokalemia 11/25/2020   S/P laparoscopic fundoplication 40/98/1191   Status post total replacement of left hip 01/31/2020   Unilateral primary osteoarthritis, left hip 12/21/2019   S/P shoulder replacement, right 11/15/2019   Overweight (BMI 25.0-29.9) 04/15/2019   Diverticulosis 04/14/2019   Chronic diastolic CHF (congestive heart failure) ([place]) 04/14/2019   Unstable angina ([place]) 10/20/2017   Chronic pain 09/23/2016   Pre-diabetes 09/02/2016   Recurrent major depressive disorder, in partial remission ([place]) 07/14/2016  Seizures ([place]) 07/14/2016   GAD (generalized anxiety disorder) 07/14/2016   Insomnia 09/05/2014   Allergic rhinitis 09/05/2014   Cervical spondylosis without myelopathy 12/19/2013    Class: Chronic   Neural foraminal stenosis of cervical spine 12/19/2013   Cervical radiculitis  09/19/2013   Lumbosacral spondylosis without myelopathy 11/16/2012   Postlaminectomy syndrome, lumbar region 11/16/2012   Herpes simplex virus (HSV) infection 10/31/2008   Hyperlipidemia with target LDL less than 100 10/31/2008   Essential hypertension 10/31/2008   GERD 10/31/2008   SPINAL STENOSIS OF LUMBAR REGION 10/31/2008   Myalgia 10/31/2008   Osteoporosis 10/31/2008   SPONDYLOLISTHESIS 10/31/2008   SYNCOPE 10/31/2008   Sleep apnea 10/31/2008    Allergies:  Allergies  Allergen Reactions   Codeine Other (See Comments)    "I will have a heart attack."   Morphine And Related Other (See Comments)    "It will cause me to have a heart attack."   Ambien [Zolpidem Tartrate] Nausea And Vomiting   Clonidine Derivatives Nausea And Vomiting    gerd - caused acid reflux per pt   Metformin And Related Nausea And Vomiting    cramping from metformin   Lyrica [Pregabalin] Swelling and Other (See Comments)    Weight gain   Neurontin [Gabapentin] Other (See Comments)    Causes elevated LFTs    Medications:  Current Outpatient Medications:    doxycycline (VIBRA-TABS) 100 MG tablet, Take 1 tablet (100 mg total) by mouth 2 (two) times daily., Disp: 20 tablet, Rfl: 0   predniSONE (STERAPRED UNI-PAK 21 TAB) 10 MG (21) TBPK tablet, Use as directed, Disp: 21 tablet, Rfl: 0   promethazine-dextromethorphan (PROMETHAZINE-DM) 6.25-15 MG/5ML syrup, Take 5 mLs by mouth 3 (three) times daily as needed for cough., Disp: 118 mL, Rfl: 0   Accu-Chek Softclix Lancets lancets, TEST BLOOD SUGAR UP TO 4 TIMES A DAY AS DIRECTED. R73.03, Disp: 400 each, Rfl: 3   acyclovir ointment (ZOVIRAX) 5 %, APPLY TOPICALLY EVERY 3 HOURS., Disp: 30 g, Rfl: 1   albuterol (VENTOLIN HFA) 108 (90 Base) MCG/ACT inhaler, INHALE 2 PUFFS EVERY 6 HOURS AS NEEDED FOR SHORTNESS OF BREATH AND WHEEZING., Disp: 6.7 g, Rfl: 0   Alcohol Swabs (B-D SINGLE USE SWABS REGULAR) PADS, USE 1 PAD DAILY WHEN CHECKING BLOOD SUGAR. R73.03, Disp: 100  each, Rfl: 3   aspirin EC 81 MG EC tablet, Take 1 tablet (81 mg total) by mouth daily. Swallow whole., Disp: 30 tablet, Rfl: 0   blood glucose meter kit and supplies, Dispense based on patient and insurance preference. Use up to four times daily as directed. (FOR ICD-10 E10.9, E11.9)., Disp: 1 each, Rfl: 0   busPIRone (BUSPAR) 10 MG tablet, Take 1 tablet (10 mg total) by mouth 2 (two) times daily as needed., Disp: 60 tablet, Rfl: 5   butalbital-acetaminophen-caffeine (FIORICET) 50-325-40 MG tablet, TAKE 1 TABLET BY MOUTH EVERY 6 HOURS AS NEEDED FOR HEADACHE., Disp: 20 tablet, Rfl: 0   carvedilol (COREG) 3.125 MG tablet, TAKE 1/2 (0.5) TABLET TWICE A DAY WITH A MEAL; BLOOD PRESSURE OVER 150 BEFORE TAKING., Disp: 30 tablet, Rfl: 1   celecoxib (CELEBREX) 200 MG capsule, TAKE 1 CAPSULE BY MOUTH TWICE DAILY BETWEEN MEALS AS NEEDED., Disp: 60 capsule, Rfl: 0   Deutetrabenazine 6 MG TABS, Take 1 tablet by mouth 2 (two) times daily., Disp: 60 tablet, Rfl: 2   dexlansoprazole (DEXILANT) 60 MG capsule, Take 1 capsule (60 mg total) by mouth daily., Disp: 90 capsule, Rfl: 1   diclofenac Sodium (  VOLTAREN) 1 % GEL, APPLY 4 GRAMS TOPICALLY 4 TIMES A DAY. (Patient taking differently: Apply 4 g topically 4 (four) times daily as needed (pain).), Disp: 100 g, Rfl: 0   DULoxetine (CYMBALTA) 60 MG capsule, Take 1 capsule (60 mg total) by mouth at bedtime., Disp: 90 capsule, Rfl: 1   escitalopram (LEXAPRO) 20 MG tablet, Take 1 tablet (20 mg total) by mouth daily., Disp: 90 tablet, Rfl: 1   estradiol (ESTRACE) 0.1 MG/GM vaginal cream, USE 1 APPLICATORFUL IN VAGINA AT BEDTIME AS DIRECTED., Disp: 42.5 g, Rfl: 0   FIBER PO, Take 1 tablet by mouth in the morning and at bedtime., Disp: , Rfl:    fludrocortisone (FLORINEF) 0.1 MG tablet, Take 1 tablet (100 mcg total) by mouth every other day., Disp: 45 tablet, Rfl: 3   fluticasone (FLONASE) 50 MCG/ACT nasal spray, USE 1 SPRAY IN EACH NOSTRIL ONCE DAILY. (Patient taking  differently: Place 1 spray into both nostrils daily as needed for allergies.), Disp: 16 g, Rfl: 5   fluticasone furoate-vilanterol (BREO ELLIPTA) 100-25 MCG/ACT AEPB, Inhale 1 puff into the lungs daily., Disp: 1 each, Rfl: 11   furosemide (LASIX) 40 MG tablet, TAKE (1) TABLET DAILY AS NEEDED FOR (EXCESSIVE) FLUID., Disp: 90 tablet, Rfl: 1   glucose blood (ACCU-CHEK GUIDE) test strip, TEST BLOOD SUGAR UP TO 4 TIMES A DAY AS DIRECTED. R73.03, Disp: 400 strip, Rfl: 3   hydrALAZINE (APRESOLINE) 25 MG tablet, TAKE 1 TABLET BY MOUTH 3 TIMES DAILY AS NEEDED (FOR SEVERE HYPERTENSION/SYSTOLIC NUMBER 161 OR GREATER)., Disp: 90 tablet, Rfl: 3   HYDROcodone-acetaminophen (NORCO) 7.5-325 MG tablet, Take 1 tablet by mouth in the morning, at noon, and at bedtime., Disp: 90 tablet, Rfl: 0   Ibuprofen-diphenhydrAMINE HCl (ADVIL PM) 200-25 MG CAPS, Take 2 capsules by mouth at bedtime., Disp: , Rfl:    ipratropium (ATROVENT) 0.02 % nebulizer solution, USE 1 VIAL (3ML) IN NEBULIZER EVERY 6 HOURS AS NEEDED FOR WHEEZING OR SHORTNESS OF BREATH., Disp: 150 mL, Rfl: 2   isosorbide mononitrate (IMDUR) 30 MG 24 hr tablet, Take 1.5 tablets (45 mg total) by mouth daily., Disp: 45 tablet, Rfl: 6   JARDIANCE 25 MG TABS tablet, TAKE 1 TABLET BY MOUTH DAILY BEFORE BREAKFAST., Disp: 30 tablet, Rfl: 0   lamoTRIgine (LAMICTAL) 150 MG tablet, Take 1 tablet (150 mg total) by mouth daily., Disp: 90 tablet, Rfl: 1   levETIRAcetam (KEPPRA) 500 MG tablet, Take 1 tablet (500 mg total) by mouth 2 (two) times daily., Disp: 180 tablet, Rfl: 1   levocetirizine (XYZAL) 5 MG tablet, TAKE 1 TABLET BY MOUTH EVERY MORNING., Disp: 30 tablet, Rfl: 2   lidocaine (LIDODERM) 5 %, Place 1 patch onto the skin daily. Remove & Discard patch within 12 hours or as directed by MD (Patient taking differently: Place 1 patch onto the skin daily as needed (pain). Remove & Discard patch within 12 hours or as directed by MD), Disp: 30 patch, Rfl: 3   lidocaine  (XYLOCAINE) 5 % ointment, APPLY TO AFFECTED AREA 3 TIMES A DAY AS NEEDED FOR MILD OR MODERATE PAIN., Disp: 35.44 g, Rfl: 1   methocarbamol (ROBAXIN) 500 MG tablet, Take 1 tablet (500 mg total) by mouth every 8 (eight) hours as needed for muscle spasms., Disp: 30 tablet, Rfl: 0   methocarbamol (ROBAXIN) 750 MG tablet, Take 1 tablet (750 mg total) by mouth every 12 (twelve) hours as needed for muscle spasms., Disp: 30 tablet, Rfl: 0   nitroGLYCERIN (NITROSTAT) 0.4 MG  SL tablet, PLACE ONE (1) TABLET UNDER TONGUE EVERY 5 MINUTES UP TO (3) DOSES AS NEEDED FOR CHEST PAIN., Disp: 25 tablet, Rfl: 1   ondansetron (ZOFRAN ODT) 4 MG disintegrating tablet, Take 1 tablet (4 mg total) by mouth every 8 (eight) hours as needed for nausea or vomiting., Disp: 20 tablet, Rfl: 0   ondansetron (ZOFRAN) 4 MG tablet, TAKE 1 TABLET BY MOUTH EVERY 8 HOURS AS NEEDED FOR NAUSEA AND VOMITING., Disp: 20 tablet, Rfl: 0   oxybutynin (DITROPAN-XL) 5 MG 24 hr tablet, Take 1 tablet (5 mg total) by mouth at bedtime., Disp: 90 tablet, Rfl: 1   Probiotic Product (PROBIOTIC PO), Take 1 capsule by mouth daily., Disp: , Rfl:    rosuvastatin (CRESTOR) 10 MG tablet, Take 1 tablet (10 mg total) by mouth daily., Disp: 90 tablet, Rfl: 1   tetrabenazine (XENAZINE) 12.5 MG tablet, Take 1 tablet (12.5 mg total) by mouth daily., Disp: 30 tablet, Rfl: 2   traZODone (DESYREL) 150 MG tablet, Take 1 tablet (150 mg total) by mouth at bedtime., Disp: 90 tablet, Rfl: 1   valACYclovir (VALTREX) 1000 MG tablet, Take 1 tablet (1,000 mg total) by mouth 3 (three) times daily., Disp: 21 tablet, Rfl: 2   valACYclovir (VALTREX) 500 MG tablet, TAKE (1) TABLET TWICE DAILY., Disp: 180 tablet, Rfl: 1  Observations/Objective: Patient is well-developed, well-nourished in no acute distress.  Resting comfortably  at home.  Head is normocephalic, atraumatic.  No labored breathing.  Speech is clear and coherent with logical content.  Patient is alert and oriented at  baseline.  Constant nonproductive cough  Assessment and Plan: 1. Acute bacterial bronchitis - doxycycline (VIBRA-TABS) 100 MG tablet; Take 1 tablet (100 mg total) by mouth 2 (two) times daily.  Dispense: 20 tablet; Refill: 0 - predniSONE (STERAPRED UNI-PAK 21 TAB) 10 MG (21) TBPK tablet; Use as directed  Dispense: 21 tablet; Refill: 0 - promethazine-dextromethorphan (PROMETHAZINE-DM) 6.25-15 MG/5ML syrup; Take 5 mLs by mouth 3 (three) times daily as needed for cough.  Dispense: 118 mL; Refill: 0  - Take meds as prescribed - Use a cool mist humidifier  -Use saline nose sprays frequently -Force fluids -For any cough or congestion  Use plain Mucinex- regular strength or max strength is fine -For fever or aces or pains- take tylenol or ibuprofen. -Throat lozenges if help -Follow up if symptoms worsen or do not improve   Follow Up Instructions: I discussed the assessment and treatment plan with the patient. The patient was provided an opportunity to ask questions and all were answered. The patient agreed with the plan and demonstrated an understanding of the instructions.  A copy of instructions were sent to the patient via MyChart unless otherwise noted below.     The patient was advised to call back or seek an in-person evaluation if the symptoms worsen or if the condition fails to improve as anticipated.  Time:  I spent 9 minutes with the patient via telehealth technology discussing the above problems/concerns.    Evelina Dun, FNP

## 2022-03-19 DIAGNOSIS — B9689 Other specified bacterial agents as the cause of diseases classified elsewhere: Secondary | ICD-10-CM

## 2022-03-20 ENCOUNTER — Encounter (INDEPENDENT_AMBULATORY_CARE_PROVIDER_SITE_OTHER): Payer: 59

## 2022-03-20 DIAGNOSIS — G2401 Drug induced subacute dyskinesia: Secondary | ICD-10-CM

## 2022-03-20 MED ORDER — PROMETHAZINE-DM 6.25-15 MG/5ML PO SYRP: 5.0000 mL | ORAL_SOLUTION | Freq: Three times a day (TID) | ORAL | 0 refills | Status: DC | PRN

## 2022-03-21 MED ORDER — AUSTEDO 6 MG PO TABS: 1.0000 | ORAL_TABLET | Freq: Two times a day (BID) | ORAL | 2 refills | Status: DC

## 2022-03-21 NOTE — Telephone Encounter (Signed)
Please see the MyChart message reply(ies) for my assessment and plan.    This patient gave consent for this Medical Advice Message and is aware that it may result in a bill to [employer], as well as the possibility of receiving a bill for a co-payment or deductible. They are an established patient, but are not seeking medical advice exclusively about a problem treated during an in person or video visit in the last seven days. I did not recommend an in person or video visit within seven days of my reply.    I spent a total of 8 minutes cumulative time within 7 days through [employer].    Patient has tardive dyskinesia and having trouble with getting insurance to cover any meds. SHe contacted her insurance and they said they would pay for austedo.  Meds ordered this encounter  Medications   Deutetrabenazine (AUSTEDO) 6 MG TABS    Sig: Take 1 tablet by mouth 2 (two) times daily.    Dispense:  60 tablet    Refill:  2    Order Specific Question:   Supervising Provider    Answer:   Nils Pyle [[id number]]   [name] Daphine Deutscher, FNP

## 2022-03-27 ENCOUNTER — Encounter: Payer: Self-pay | Admitting: Nurse Practitioner

## 2022-03-27 ENCOUNTER — Other Ambulatory Visit: Payer: Self-pay | Admitting: Nurse Practitioner

## 2022-03-27 ENCOUNTER — Ambulatory Visit (INDEPENDENT_AMBULATORY_CARE_PROVIDER_SITE_OTHER): Payer: 59 | Admitting: Nurse Practitioner

## 2022-03-27 VITALS — BP 146/84 | HR 70 | Temp 98.2°F | Resp 20 | Ht 62.0 in | Wt 126.0 lb

## 2022-03-27 DIAGNOSIS — M47817 Spondylosis without myelopathy or radiculopathy, lumbosacral region: Secondary | ICD-10-CM

## 2022-03-27 DIAGNOSIS — M48061 Spinal stenosis, lumbar region without neurogenic claudication: Secondary | ICD-10-CM | POA: Diagnosis not present

## 2022-03-27 DIAGNOSIS — N941 Unspecified dyspareunia: Secondary | ICD-10-CM

## 2022-03-27 MED ORDER — HYDROCODONE-ACETAMINOPHEN 7.5-325 MG PO TABS: 1.0000 | ORAL_TABLET | Freq: Three times a day (TID) | ORAL | 0 refills | Status: DC

## 2022-03-27 MED ORDER — HYDROCODONE-ACETAMINOPHEN 7.5-325 MG PO TABS: 1.0000 | ORAL_TABLET | Freq: Three times a day (TID) | ORAL | 0 refills | Status: AC

## 2022-03-27 NOTE — Patient Instructions (Signed)

## 2022-03-27 NOTE — Progress Notes (Signed)
Subjective:    Patient ID: [name], female    DOB: 12-22-1955, 66 y.o.   MRN: [id number]   Chief Complaint: Pain Management   HPI Pain assessment: Cause of pain- spinal stenosis Pain location- mainly in lower back Pain on scale of 1-10- 5/10 currently Frequency- daily What increases pain-nothing really What makes pain Better-rest helps Effects on ADL - none Any change in general medical condition-none  Current opioids rx- norco 7.5/325 TID # meds rx- 90 Effectiveness of current meds-helps Adverse reactions from pain meds-none Morphine equivalent- 22.5MME  Pill count performed-No Last drug screen - 10/07/20 ( high risk q37m, moderate risk q5m, low risk yearly ) Urine drug screen today- Yes Was the [facility] reviewed- yes  If yes were their any concerning findings? - no   Overdose risk: 1    09/12/2021    3:09 PM  Opioid Risk   Alcohol 0  Illegal Drugs 0  Rx Drugs 0  Alcohol 0  Illegal Drugs 0  Rx Drugs 0  Age between 16-45 years  0  History of Preadolescent Sexual Abuse 3  Psychological Disease 2  ADD Negative  OCD Negative  Bipolar Positive  Depression 1  Opioid Risk Tool Scoring 6  Opioid Risk Interpretation Moderate Risk     Pain contract signed on:03/27/22-     Review of Systems  Constitutional:  Negative for diaphoresis.  Eyes:  Negative for pain.  Respiratory:  Negative for shortness of breath.   Cardiovascular:  Negative for chest pain, palpitations and leg swelling.  Gastrointestinal:  Negative for abdominal pain.  Endocrine: Negative for polydipsia.  Skin:  Negative for rash.  Neurological:  Negative for dizziness, weakness and headaches.  Hematological:  Does not bruise/bleed easily.  All other systems reviewed and are negative.      Objective:   Physical Exam Vitals and nursing note reviewed.  Constitutional:      General: She is not in acute distress.    Appearance: Normal appearance. She is well-developed.  Neck:      Vascular: No carotid bruit or JVD.  Cardiovascular:     Rate and Rhythm: Normal rate and regular rhythm.     Heart sounds: Normal heart sounds.  Pulmonary:     Effort: Pulmonary effort is normal. No respiratory distress.     Breath sounds: Normal breath sounds. No wheezing or rales.  Chest:     Chest wall: No tenderness.  Abdominal:     General: Bowel sounds are normal. There is no distension or abdominal bruit.     Palpations: Abdomen is soft. There is no hepatomegaly, splenomegaly, mass or pulsatile mass.     Tenderness: There is no abdominal tenderness.  Musculoskeletal:        General: Normal range of motion.     Cervical back: Normal range of motion and neck supple.     Comments: Gait slow and steady  Lymphadenopathy:     Cervical: No cervical adenopathy.  Skin:    General: Skin is warm and dry.  Neurological:     Mental Status: She is alert and oriented to person, place, and time.     Deep Tendon Reflexes: Reflexes are normal and symmetric.  Psychiatric:        Behavior: Behavior normal.        Thought Content: Thought content normal.        Judgment: Judgment normal.     BP (!) 146/84   Pulse 70   Temp 98.2 F (  36.8 C) (Temporal)   Resp 20   Ht 5\' 2"  (1.575 m)   Wt 126 lb (57.2 kg)   SpO2 93%   BMI 23.05 kg/m        Assessment & Plan:  [name] in today with chief complaint of Pain Management   1. SPINAL STENOSIS OF LUMBAR REGION  2. Lumbosacral spondylosis without myelopathy Moist heat  Rest  Fall prevention - ToxASSURE Select 13 (MW), Urine - HYDROcodone-acetaminophen (NORCO) 7.5-325 MG tablet; Take 1 tablet by mouth in the morning, at noon, and at bedtime.  Dispense: 90 tablet; Refill: 0 - HYDROcodone-acetaminophen (NORCO) 7.5-325 MG tablet; Take 1 tablet by mouth in the morning, at noon, and at bedtime.  Dispense: 90 tablet; Refill: 0 - HYDROcodone-acetaminophen (NORCO) 7.5-325 MG tablet; Take 1 tablet by mouth in the morning, at noon, and  at bedtime.  Dispense: 90 tablet; Refill: 0    The above assessment and management plan was discussed with the patient. The patient verbalized understanding of and has agreed to the management plan. Patient is aware to call the clinic if symptoms persist or worsen. Patient is aware when to return to the clinic for a follow-up visit. Patient educated on when it is appropriate to go to the emergency department.   [name] 07-29-1975, FNP

## 2022-03-29 LAB — TOXASSURE SELECT 13 (MW), URINE

## 2022-04-24 ENCOUNTER — Telehealth: Payer: Self-pay | Admitting: *Deleted

## 2022-04-24 NOTE — Telephone Encounter (Signed)
Patient aware through Mychart message. Awaiting referral

## 2022-04-24 NOTE — Telephone Encounter (Signed)
AUSTEDO TAB 6MG  is denied for not meeting the prior authorization requirement(s).  Medication authorization requires the following:  The drug is prescribed by or in consultation with a neurologist or psychiatrist.

## 2022-04-24 NOTE — Telephone Encounter (Signed)
In order  to get this med it has to be prescribed by neurology or psych.

## 2022-04-28 ENCOUNTER — Other Ambulatory Visit: Payer: Self-pay | Admitting: Nurse Practitioner

## 2022-04-28 ENCOUNTER — Other Ambulatory Visit: Payer: Self-pay | Admitting: Cardiology

## 2022-04-28 DIAGNOSIS — F411 Generalized anxiety disorder: Secondary | ICD-10-CM

## 2022-04-28 DIAGNOSIS — G2401 Drug induced subacute dyskinesia: Secondary | ICD-10-CM

## 2022-04-28 DIAGNOSIS — I10 Essential (primary) hypertension: Secondary | ICD-10-CM

## 2022-04-28 NOTE — Progress Notes (Signed)
Referral to neurology

## 2022-05-02 ENCOUNTER — Encounter: Payer: Self-pay | Admitting: Nurse Practitioner

## 2022-05-02 ENCOUNTER — Telehealth (INDEPENDENT_AMBULATORY_CARE_PROVIDER_SITE_OTHER): Payer: 59 | Admitting: Nurse Practitioner

## 2022-05-02 DIAGNOSIS — N760 Acute vaginitis: Secondary | ICD-10-CM | POA: Diagnosis not present

## 2022-05-02 DIAGNOSIS — B9689 Other specified bacterial agents as the cause of diseases classified elsewhere: Secondary | ICD-10-CM

## 2022-05-02 MED ORDER — METRONIDAZOLE 500 MG PO TABS: 500.0000 mg | ORAL_TABLET | Freq: Two times a day (BID) | ORAL | 0 refills | Status: DC

## 2022-05-02 NOTE — Progress Notes (Signed)
Virtual Visit Consent   [name], you are scheduled for a virtual visit with [name] Hassell Done, [place], a [place] provider, today.     Just as with appointments in the office, your consent must be obtained to participate.  Your consent will be active for this visit and any virtual visit you may have with one of our providers in the next 365 days.     If you have a MyChart account, a copy of this consent can be sent to you electronically.  All virtual visits are billed to your insurance company just like a traditional visit in the office.    As this is a virtual visit, video technology does not allow for your provider to perform a traditional examination.  This may limit your provider's ability to fully assess your condition.  If your provider identifies any concerns that need to be evaluated in person or the need to arrange testing (such as labs, EKG, etc.), we will make arrangements to do so.     Although advances in technology are sophisticated, we cannot ensure that it will always work on either your end or our end.  If the connection with a video visit is poor, the visit may have to be switched to a telephone visit.  With either a video or telephone visit, we are not always able to ensure that we have a secure connection.     I need to obtain your verbal consent now.   Are you willing to proceed with your visit today? YES   [name] has provided verbal consent on 05/02/2022 for a virtual visit (video or telephone).   [name] Hassell Done, FNP   Date: 05/02/2022 2:56 PM   Virtual Visit via Video Note   I, [name] Hassell Done, connected with [name] ([id number], 02/22/1956) on 05/02/22 at  5:30 PM EST by a video-enabled telemedicine application and verified that I am speaking with the correct person using two identifiers.  Location: Patient: Virtual Visit Location Patient: Home Provider: Virtual Visit Location Provider: Mobile   I discussed the  limitations of evaluation and management by telemedicine and the availability of in person appointments. The patient expressed understanding and agreed to proceed.    History of Present Illness: [name] is a 67 y.o. who identifies as a female who was assigned female at birth, and is being seen today for vaginal discharge.  HPI: Vaginal Discharge The patient's primary symptoms include genital itching, a genital odor and vaginal discharge. This is a recurrent problem. The current episode started in the past 7 days. The problem occurs constantly. The problem has been waxing and waning. The patient is experiencing no pain. She is not pregnant. The vaginal discharge was grey. She is postmenopausal.    Review of Systems  Genitourinary:  Positive for vaginal discharge.    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   Hypokalemia 11/25/2020   S/P laparoscopic fundoplication 36/64/4034   Status post total replacement of left hip 01/31/2020   Unilateral primary osteoarthritis, left hip 12/21/2019   S/P shoulder replacement, right 11/15/2019   Overweight (BMI 25.0-29.9) 04/15/2019   Diverticulosis 04/14/2019   Chronic diastolic CHF (congestive heart failure) ([place]) 04/14/2019   Unstable angina ([place]) 10/20/2017   Chronic pain 09/23/2016   Pre-diabetes 09/02/2016   Recurrent major depressive disorder, in partial remission ([place]) 07/14/2016  Seizures ([place]) 07/14/2016   GAD (generalized anxiety disorder) 07/14/2016   Insomnia 09/05/2014   Allergic rhinitis 09/05/2014   Cervical spondylosis without myelopathy 12/19/2013    Class: Chronic   Neural foraminal stenosis of cervical spine 12/19/2013   Cervical radiculitis 09/19/2013   Lumbosacral spondylosis without myelopathy 11/16/2012   Postlaminectomy syndrome, lumbar region 11/16/2012   Herpes simplex virus  (HSV) infection 10/31/2008   Hyperlipidemia with target LDL less than 100 10/31/2008   Essential hypertension 10/31/2008   GERD 10/31/2008   SPINAL STENOSIS OF LUMBAR REGION 10/31/2008   Myalgia 10/31/2008   Osteoporosis 10/31/2008   SPONDYLOLISTHESIS 10/31/2008   SYNCOPE 10/31/2008   Sleep apnea 10/31/2008    Allergies:  Allergies  Allergen Reactions   Codeine Other (See Comments)    "I will have a heart attack."   Morphine And Related Other (See Comments)    "It will cause me to have a heart attack."   Ambien [Zolpidem Tartrate] Nausea And Vomiting   Clonidine Derivatives Nausea And Vomiting    gerd - caused acid reflux per pt   Metformin And Related Nausea And Vomiting    cramping from metformin   Lyrica [Pregabalin] Swelling and Other (See Comments)    Weight gain   Neurontin [Gabapentin] Other (See Comments)    Causes elevated LFTs    Medications:  Current Outpatient Medications:    Accu-Chek Softclix Lancets lancets, TEST BLOOD SUGAR UP TO 4 TIMES A DAY AS DIRECTED. R73.03, Disp: 400 each, Rfl: 3   acyclovir ointment (ZOVIRAX) 5 %, APPLY TOPICALLY EVERY 3 HOURS., Disp: 30 g, Rfl: 1   albuterol (VENTOLIN HFA) 108 (90 Base) MCG/ACT inhaler, INHALE 2 PUFFS EVERY 6 HOURS AS NEEDED FOR SHORTNESS OF BREATH AND WHEEZING., Disp: 6.7 g, Rfl: 2   Alcohol Swabs (B-D SINGLE USE SWABS REGULAR) PADS, USE 1 PAD DAILY WHEN CHECKING BLOOD SUGAR. R73.03, Disp: 100 each, Rfl: 3   aspirin EC 81 MG EC tablet, Take 1 tablet (81 mg total) by mouth daily. Swallow whole., Disp: 30 tablet, Rfl: 0   blood glucose meter kit and supplies, Dispense based on patient and insurance preference. Use up to four times daily as directed. (FOR ICD-10 E10.9, E11.9)., Disp: 1 each, Rfl: 0   busPIRone (BUSPAR) 10 MG tablet, Take 1 tablet (10 mg total) by mouth 2 (two) times daily as needed., Disp: 60 tablet, Rfl: 5   butalbital-acetaminophen-caffeine (FIORICET) 50-325-40 MG tablet, TAKE 1 TABLET BY MOUTH EVERY 6  HOURS AS NEEDED FOR HEADACHE., Disp: 20 tablet, Rfl: 0   carvedilol (COREG) 3.125 MG tablet, TAKE 1/2 (0.5) TABLET TWICE A DAY WITH A MEAL; BLOOD PRESSURE OVER 150 BEFORE TAKING., Disp: 30 tablet, Rfl: 1   celecoxib (CELEBREX) 200 MG capsule, TAKE 1 CAPSULE BY MOUTH TWICE DAILY BETWEEN MEALS AS NEEDED., Disp: 60 capsule, Rfl: 0   Deutetrabenazine (AUSTEDO) 6 MG TABS, Take 1 tablet by mouth 2 (two) times daily., Disp: 60 tablet, Rfl: 2   dexlansoprazole (DEXILANT) 60 MG capsule, Take 1 capsule (60 mg total) by mouth daily., Disp: 90 capsule, Rfl: 1   diclofenac Sodium (VOLTAREN) 1 % GEL, APPLY 4 GRAMS TOPICALLY 4 TIMES A DAY. (Patient taking differently: Apply 4 g topically 4 (four) times daily as needed (pain).), Disp: 100 g, Rfl: 0   doxycycline (VIBRA-TABS) 100 MG tablet, Take 1 tablet (100 mg total) by mouth 2 (two) times daily., Disp: 20 tablet, Rfl: 0   DULoxetine (CYMBALTA) 60 MG capsule, Take 1 capsule (60 mg total)  by mouth at bedtime., Disp: 90 capsule, Rfl: 1   escitalopram (LEXAPRO) 20 MG tablet, Take 1 tablet (20 mg total) by mouth daily., Disp: 90 tablet, Rfl: 1   estradiol (ESTRACE) 0.1 MG/GM vaginal cream, USE 1 APPLICATORFUL IN VAGINA AT BEDTIME AS DIRECTED., Disp: 42.5 g, Rfl: 1   FIBER PO, Take 1 tablet by mouth in the morning and at bedtime., Disp: , Rfl:    fludrocortisone (FLORINEF) 0.1 MG tablet, Take 1 tablet (100 mcg total) by mouth every other day., Disp: 45 tablet, Rfl: 3   fluticasone (FLONASE) 50 MCG/ACT nasal spray, USE 1 SPRAY IN EACH NOSTRIL ONCE DAILY. (Patient taking differently: Place 1 spray into both nostrils daily as needed for allergies.), Disp: 16 g, Rfl: 5   fluticasone furoate-vilanterol (BREO ELLIPTA) 100-25 MCG/ACT AEPB, Inhale 1 puff into the lungs daily., Disp: 1 each, Rfl: 11   furosemide (LASIX) 40 MG tablet, TAKE 1 TABLET DAILY AS NEEDED FOR EXCESSIVE FLUID., Disp: 30 tablet, Rfl: 1   glucose blood (ACCU-CHEK GUIDE) test strip, TEST BLOOD SUGAR UP TO 4  TIMES A DAY AS DIRECTED. R73.03, Disp: 400 strip, Rfl: 3   hydrALAZINE (APRESOLINE) 25 MG tablet, TAKE 1 TABLET BY MOUTH 3 TIMES DAILY AS NEEDED (FOR SEVERE HYPERTENSION/SYSTOLIC NUMBER 812 OR GREATER)., Disp: 90 tablet, Rfl: 1   [START ON 05/27/2022] HYDROcodone-acetaminophen (NORCO) 7.5-325 MG tablet, Take 1 tablet by mouth in the morning, at noon, and at bedtime., Disp: 90 tablet, Rfl: 0   HYDROcodone-acetaminophen (NORCO) 7.5-325 MG tablet, Take 1 tablet by mouth in the morning, at noon, and at bedtime., Disp: 90 tablet, Rfl: 0   Ibuprofen-diphenhydrAMINE HCl (ADVIL PM) 200-25 MG CAPS, Take 2 capsules by mouth at bedtime., Disp: , Rfl:    ipratropium (ATROVENT) 0.02 % nebulizer solution, USE 1 VIAL (3ML) IN NEBULIZER EVERY 6 HOURS AS NEEDED FOR WHEEZING OR SHORTNESS OF BREATH., Disp: 150 mL, Rfl: 2   isosorbide mononitrate (IMDUR) 30 MG 24 hr tablet, Take 1.5 tablets (45 mg total) by mouth daily., Disp: 45 tablet, Rfl: 6   JARDIANCE 25 MG TABS tablet, TAKE 1 TABLET BY MOUTH DAILY BEFORE BREAKFAST., Disp: 30 tablet, Rfl: 0   lamoTRIgine (LAMICTAL) 150 MG tablet, TAKE 1 TABLET ONCE DAILY., Disp: 30 tablet, Rfl: 1   levETIRAcetam (KEPPRA) 500 MG tablet, Take 1 tablet (500 mg total) by mouth 2 (two) times daily., Disp: 180 tablet, Rfl: 1   levocetirizine (XYZAL) 5 MG tablet, TAKE 1 TABLET BY MOUTH EVERY MORNING., Disp: 30 tablet, Rfl: 2   lidocaine (LIDODERM) 5 %, Place 1 patch onto the skin daily. Remove & Discard patch within 12 hours or as directed by MD (Patient taking differently: Place 1 patch onto the skin daily as needed (pain). Remove & Discard patch within 12 hours or as directed by MD), Disp: 30 patch, Rfl: 3   lidocaine (XYLOCAINE) 5 % ointment, APPLY TO AFFECTED AREA 3 TIMES A DAY AS NEEDED FOR MILD OR MODERATE PAIN., Disp: 35.44 g, Rfl: 1   methocarbamol (ROBAXIN) 500 MG tablet, Take 1 tablet (500 mg total) by mouth every 8 (eight) hours as needed for muscle spasms., Disp: 30 tablet, Rfl:  0   methocarbamol (ROBAXIN) 750 MG tablet, Take 1 tablet (750 mg total) by mouth every 12 (twelve) hours as needed for muscle spasms., Disp: 30 tablet, Rfl: 0   nitroGLYCERIN (NITROSTAT) 0.4 MG SL tablet, PLACE ONE (1) TABLET UNDER TONGUE EVERY 5 MINUTES UP TO (3) DOSES AS NEEDED FOR CHEST PAIN.,  Disp: 25 tablet, Rfl: 1   ondansetron (ZOFRAN ODT) 4 MG disintegrating tablet, Take 1 tablet (4 mg total) by mouth every 8 (eight) hours as needed for nausea or vomiting., Disp: 20 tablet, Rfl: 0   ondansetron (ZOFRAN) 4 MG tablet, TAKE 1 TABLET BY MOUTH EVERY 8 HOURS AS NEEDED FOR NAUSEA AND VOMITING., Disp: 20 tablet, Rfl: 0   oxybutynin (DITROPAN-XL) 5 MG 24 hr tablet, Take 1 tablet (5 mg total) by mouth at bedtime., Disp: 90 tablet, Rfl: 1   predniSONE (STERAPRED UNI-PAK 21 TAB) 10 MG (21) TBPK tablet, Use as directed, Disp: 21 tablet, Rfl: 0   Probiotic Product (PROBIOTIC PO), Take 1 capsule by mouth daily., Disp: , Rfl:    promethazine-dextromethorphan (PROMETHAZINE-DM) 6.25-15 MG/5ML syrup, Take 5 mLs by mouth 3 (three) times daily as needed for cough., Disp: 473 mL, Rfl: 0   rosuvastatin (CRESTOR) 10 MG tablet, Take 1 tablet (10 mg total) by mouth daily., Disp: 90 tablet, Rfl: 1   tetrabenazine (XENAZINE) 12.5 MG tablet, Take 1 tablet (12.5 mg total) by mouth daily., Disp: 30 tablet, Rfl: 2   traZODone (DESYREL) 150 MG tablet, Take 1 tablet (150 mg total) by mouth at bedtime., Disp: 90 tablet, Rfl: 1   valACYclovir (VALTREX) 1000 MG tablet, Take 1 tablet (1,000 mg total) by mouth 3 (three) times daily., Disp: 21 tablet, Rfl: 2   valACYclovir (VALTREX) 500 MG tablet, TAKE (1) TABLET TWICE DAILY., Disp: 180 tablet, Rfl: 1  Observations/Objective: Patient is well-developed, well-nourished in no acute distress.  Resting comfortably  at home.  Head is normocephalic, atraumatic.  No labored breathing.  Speech is clear and coherent with logical content.  Patient is alert and oriented at baseline.     Assessment and Plan:  SEPHORA BOYAR in today with chief complaint of Vaginal Discharge   1. Bacterial vaginosis No douching  No bubble baths RTO prn  Meds ordered this encounter  Medications   metroNIDAZOLE (FLAGYL) 500 MG tablet    Sig: Take 1 tablet (500 mg total) by mouth 2 (two) times daily.    Dispense:  14 tablet    Refill:  0    Order Specific Question:   Supervising Provider    Answer:   Arville Care A [[id number]]      Follow Up Instructions: I discussed the assessment and treatment plan with the patient. The patient was provided an opportunity to ask questions and all were answered. The patient agreed with the plan and demonstrated an understanding of the instructions.  A copy of instructions were sent to the patient via MyChart.  The patient was advised to call back or seek an in-person evaluation if the symptoms worsen or if the condition fails to improve as anticipated.  Time:  I spent minutes with the patient via telehealth technology discussing the above problems/concerns.    [name] Daphine Deutscher, FNP

## 2022-05-08 ENCOUNTER — Other Ambulatory Visit: Payer: Self-pay

## 2022-05-08 ENCOUNTER — Telehealth: Payer: Self-pay

## 2022-05-08 DIAGNOSIS — M7062 Trochanteric bursitis, left hip: Secondary | ICD-10-CM

## 2022-05-08 NOTE — Telephone Encounter (Signed)
Patient left voicemail requesting a bursa injection. Last injection was 05/28/20. Please advise

## 2022-05-08 NOTE — Telephone Encounter (Signed)
Referral sent for [facility]

## 2022-05-13 ENCOUNTER — Ambulatory Visit: Payer: Self-pay

## 2022-05-13 ENCOUNTER — Encounter: Payer: Self-pay | Admitting: Sports Medicine

## 2022-05-13 ENCOUNTER — Ambulatory Visit (INDEPENDENT_AMBULATORY_CARE_PROVIDER_SITE_OTHER): Payer: 59 | Admitting: Sports Medicine

## 2022-05-13 DIAGNOSIS — Z96642 Presence of left artificial hip joint: Secondary | ICD-10-CM

## 2022-05-13 DIAGNOSIS — M7061 Trochanteric bursitis, right hip: Secondary | ICD-10-CM

## 2022-05-13 DIAGNOSIS — M7062 Trochanteric bursitis, left hip: Secondary | ICD-10-CM

## 2022-05-13 MED ORDER — BETAMETHASONE SOD PHOS & ACET 6 (3-3) MG/ML IJ SUSP
6.0000 mg | INTRAMUSCULAR | Status: AC | PRN
  Administered 2022-05-13: 6 mg via INTRA_ARTICULAR

## 2022-05-13 MED ORDER — BUPIVACAINE HCL 0.25 % IJ SOLN
2.0000 mL | INTRAMUSCULAR | Status: AC | PRN
  Administered 2022-05-13: 2 mL via INTRA_ARTICULAR

## 2022-05-13 MED ORDER — LIDOCAINE HCL 1 % IJ SOLN
2.0000 mL | INTRAMUSCULAR | Status: AC | PRN
  Administered 2022-05-13: 2 mL

## 2022-05-13 NOTE — Progress Notes (Signed)
[name] - 67 y.o. female MRN [id number]  Date of birth: [birth year]  Office Visit Note: Visit Date: 05/13/2022 PCP: Chevis Pretty, FNP Referred by: Mcarthur Rossetti*  Subjective: Chief Complaint  Patient presents with   Left Hip - Pain   HPI: [name] is a pleasant 67 y.o. female who presents today for bilateral lateral hip pain, L > R.  [name] has been having bilateral lateral hip pain.  She is a little over 2 years out from a left total hip arthroplasty performed by my partner, Dr. Ninfa Linden.  She has had issues with the left greater trochanteric bursa has responded well to injections.  It has been about 3 months since her last injection.  Over the last few weeks her pain has increased where she is having pain with lying on that time.  She is also having pain over the right side that feels very similar to the left.  Painful to touch and sleep on that side.  She denies any redness or swelling.  She did have a greater trochanteric injection on this side but reports it has been 1+ year or more.  She is managed on chronic pain medication with Norco 7.5 mg TID.  Pertinent ROS were reviewed with the patient and found to be negative unless otherwise specified above in HPI.   Assessment & Plan: Visit Diagnoses:  1. Trochanteric bursitis, left hip   2. History of left hip replacement    Plan: Had a discussion with [name] regarding her bilateral lateral hip pain. We discussed all treatment options, and through shared-decision making proceeded with bilateral US-guided greater trochanteric injection.  She had good relief following this, tolerated well.  She may continue her chronic Norco medication.  Recommended ice and/or Tylenol for any postinjection pain.  We think it would be good for her to maintain hip strengthening with hip abduction.  Will follow-up with Dr. Ninfa Linden as necessary; I am happy to see her back as needed.  Follow-up: Return if symptoms  worsen or fail to improve.   Meds & Orders: No orders of the defined types were placed in this encounter.   Orders Placed This Encounter  Procedures   US Guided Needle Placement - No Linked Charges     Procedures: Large Joint Inj: bilateral greater trochanter on 05/13/2022 2:26 PM Indications: pain Details: 22 G 3.5 in needle, ultrasound-guided lateral approach Medications (Right): 2 mL lidocaine 1 %; 2 mL bupivacaine 0.25 %; 6 mg betamethasone acetate-betamethasone sodium phosphate 6 (3-3) MG/ML Medications (Left): 2 mL lidocaine 1 %; 2 mL bupivacaine 0.25 %; 6 mg betamethasone acetate-betamethasone sodium phosphate 6 (3-3) MG/ML Outcome: tolerated well, no immediate complications  US-Guided Greater Trochanteric Bursa Injection, left After discussion on risks/benefits/indications and informed verbal consent was obtained, a timeout was performed. The patient was lying in lateral recumbent position on exam table. Using ultrasound guidance, the greater trochanter was identified. The area overlying the trochanteric bursa was then prepped with Betadine and alcohol swabs. Following sterile precautions, ultrasound was reapplied to visualize needle guidance with a 22-gauge 3.5" needle utilizing an in-plane approach to inject the bursa with 2:2:1 lidocaine:bupivicaine:betamethasone. Delivery of the injectate was visualized into the region of hypoechoic fluid of the greater trochanteric bursa. Patient tolerated procedure well without immediate complications.    US-Guided Greater Trochanteric Bursa Injection, right After discussion on risks/benefits/indications and informed verbal consent was obtained, a timeout was performed. The patient was lying in lateral recumbent position on exam table. Using ultrasound  guidance, the greater trochanter was identified. The area overlying the trochanteric bursa was then prepped with Betadine and alcohol swabs. Following sterile precautions, ultrasound was reapplied to  visualize needle guidance with a 22-gauge 3.5" needle utilizing an in-plane approach to inject the bursa with 2:2:1 lidocaine:bupivicaine:betamethasone. Delivery of the injectate was visualized into the region of hypoechoic fluid of the greater trochanteric bursa. Patient tolerated procedure well without immediate complications.    Procedure, treatment alternatives, risks and benefits explained, specific risks discussed. Consent was given by the patient. Immediately prior to procedure a time out was called to verify the correct patient, procedure, equipment, support staff and site/side marked as required. Patient was prepped and draped in the usual sterile fashion.          Clinical History: No specialty comments available.  She reports that she has never smoked. She has never used smokeless tobacco.  Recent Labs    11/26/21 1213  HGBA1C 5.6    Objective:    Physical Exam  Gen: Well-appearing, in no acute distress; non-toxic CV: Well-perfused. Warm.  Resp: Breathing unlabored on room air; no wheezing. Psych: Fluid speech in conversation; appropriate affect; normal thought process Neuro: Sensation intact throughout. No gross coordination deficits.   Ortho Exam - Bilateral hips: Positive TTP over the posterior lateral aspect of the left greater trochanter as well as over the right greater trochanter.  There is no overlying redness, swelling.  The left hip has a well-healed incision from the prior left THA.  There are no mechanical blocks to internal/external rotation at the hips.  Neurovascular intact distally.  Imaging: No results found.  Past Medical/Family/Surgical/Social History: Medications & Allergies reviewed per EMR, new medications updated. 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   Hypokalemia 11/25/2020   S/P laparoscopic fundoplication [id number]    Status post total replacement of left hip 01/31/2020   Unilateral primary osteoarthritis, left hip 12/21/2019   S/P shoulder replacement, right 11/15/2019   Overweight (BMI 25.0-29.9) 04/15/2019   Diverticulosis 04/14/2019   Chronic diastolic CHF (congestive heart failure) ([place]) 04/14/2019   Unstable angina ([place]) 10/20/2017   Chronic pain 09/23/2016   Pre-diabetes 09/02/2016   Recurrent major depressive disorder, in partial remission ([place]) 07/14/2016   Seizures ([place]) 07/14/2016   GAD (generalized anxiety disorder) 07/14/2016   Insomnia 09/05/2014   Allergic rhinitis 09/05/2014   Cervical spondylosis without myelopathy 12/19/2013    Class: Chronic   Neural foraminal stenosis of cervical spine 12/19/2013   Cervical radiculitis 09/19/2013   Lumbosacral spondylosis without myelopathy 11/16/2012   Postlaminectomy syndrome, lumbar region 11/16/2012   Herpes simplex virus (HSV) infection 10/31/2008   Hyperlipidemia with target LDL less than 100 10/31/2008   Essential hypertension 10/31/2008   GERD 10/31/2008   SPINAL STENOSIS OF LUMBAR REGION 10/31/2008   Myalgia 10/31/2008   Osteoporosis 10/31/2008   SPONDYLOLISTHESIS 10/31/2008   SYNCOPE 10/31/2008   Sleep apnea 10/31/2008   Past Medical History:  Diagnosis Date   Allergy    Anemia    Anginal pain ([place])    last time    Anxiety    Arthritis    RHEUMATOID   Asthma    Bipolar 1 disorder ([place])    Blood transfusion without reported diagnosis    Cataracts, bilateral 07/2017   CHF (congestive heart failure) ([facility])    COPD (chronic obstructive pulmonary disease) ([place])    Coronary artery  disease    reported hx of "MI";  Echo 2009 with normal LVF;  Myoview 05/2011: no ischemia   Depression    Diabetes mellitus without complication ([facility])    Dyslipidemia    Dysrhythmia    SVT   Esophageal stricture    Fibromyalgia    GERD (gastroesophageal reflux disease)    H/O hiatal hernia    Head injury, unspecified    Headache     migraines   Herpes simplex infection    History of kidney stones    History of loop recorder    Managed by Dr. Crissie Sickles   Hyperlipidemia    Hypertension    Insomnia    Myocardial infarction [facility])    age 67   Osteoporosis    Pneumonia    hx   Seizures ([place])    Shortness of breath    Sleep apnea    ? neg   Spinal stenosis of lumbar region    Spondylolisthesis    Status post placement of implantable loop recorder    Supraventricular tachycardia    Syncope and collapse    s/p ILR; no arhythmogenic cause identified   UTI (lower urinary tract infection)    Family History  Problem Relation Age of Onset   Heart attack Father    Mental illness Father    Mental illness Mother    Heart attack Brother        stents   Alcohol abuse Brother    Heart disease Brother    Drug abuse Brother    Diabetes Brother    Colon cancer Maternal Aunt    Cirrhosis Brother    Stomach cancer Neg Hx    Esophageal cancer Neg Hx    Rectal cancer Neg Hx    Past Surgical History:  Procedure Laterality Date   BACK SURGERY     BREAST EXCISIONAL BIOPSY Right    1990s   BREAST SURGERY     lumpectomy   CARDIAC CATHETERIZATION  10/06/2011   CATARACT EXTRACTION W/PHACO Right 07/31/2017   Procedure: CATARACT EXTRACTION PHACO AND INTRAOCULAR LENS PLACEMENT (IOC);  Surgeon: [name], MD;  Location: [facility];  Service: Ophthalmology;  Laterality: Right;  CDE: 2.33   CATARACT EXTRACTION W/PHACO Left 08/14/2017   Procedure: CATARACT EXTRACTION PHACO AND INTRAOCULAR LENS PLACEMENT (IOC);  Surgeon: [name], MD;  Location: [facility];  Service: Ophthalmology;  Laterality: Left;  CDE: 2.74   CHOLECYSTECTOMY     CYSTOSCOPY     stone   DIAGNOSTIC LAPAROSCOPY     laparoscopic cholecystectomy   DOPPLER ECHOCARDIOGRAPHY  2009   ESOPHAGOGASTRODUODENOSCOPY N/A 10/31/2020   Procedure: ESOPHAGOGASTRODUODENOSCOPY (EGD);  Surgeon: Greer Pickerel, MD;  Location: [facility];  Service: General;  Laterality: N/A;   EYE  SURGERY Bilateral 08/14/2017   cataract removal   head up tilt table testing  06/15/2007   [name]   HEMORRHOID SURGERY     HERNIA REPAIR     insertion of implatable loop recorder  08/11/2007   [name]   POSTERIOR CERVICAL FUSION/FORAMINOTOMY N/A 12/19/2013   Procedure: RIGHT C3-4.C4-5 AND C5-6 FORAMINOTOMIES;  Surgeon: [name], MD;  Location: [place];  Service: Orthopedics;  Laterality: N/A;   TOTAL HIP ARTHROPLASTY Left 01/31/2020   Procedure: LEFT TOTAL HIP ARTHROPLASTY ANTERIOR APPROACH;  Surgeon: Mcarthur Rossetti, MD;  Location: [place];  Service: Orthopedics;  Laterality: Left;   TOTAL SHOULDER ARTHROPLASTY Right 11/15/2019   Procedure: RIGHT TOTAL SHOULDER ARTHROPLASTY;  Surgeon: Marcene Duos  Nicki Reaper, MD;  Location: [facility];  Service: Orthopedics;  Laterality: Right;   TOTAL SHOULDER REVISION Right 06/11/2021   Procedure: RIGHT SHOULDER CONVERSION TOTAL SHOULDER ARTHROPLASTY to REVERSE TOTAL SHOULDER ARTHROPLASTY;  Surgeon: Meredith Pel, MD;  Location: [place];  Service: Orthopedics;  Laterality: Right;   TUBAL LIGATION     UPPER GASTROINTESTINAL ENDOSCOPY     XI ROBOTIC ASSISTED HIATAL HERNIA REPAIR N/A 10/31/2020   Procedure: XI ROBOTIC ASSISTED HIATAL HERNIA REPAIR WITH PARTIAL FUNDOPLICATION;  Surgeon: Greer Pickerel, MD;  Location: [facility];  Service: General;  Laterality: N/A;   Social History   Occupational History   Occupation: Disability    Comment: 15 years  Tobacco Use   Smoking status: Never   Smokeless tobacco: Never  Vaping Use   Vaping Use: Never used  Substance and Sexual Activity   Alcohol use: No    Alcohol/week: 0.0 standard drinks of alcohol   Drug use: No   Sexual activity: Yes

## 2022-05-28 ENCOUNTER — Other Ambulatory Visit: Payer: Self-pay | Admitting: Nurse Practitioner

## 2022-05-28 DIAGNOSIS — N941 Unspecified dyspareunia: Secondary | ICD-10-CM

## 2022-05-29 ENCOUNTER — Other Ambulatory Visit: Payer: Self-pay | Admitting: Nurse Practitioner

## 2022-05-29 DIAGNOSIS — M47817 Spondylosis without myelopathy or radiculopathy, lumbosacral region: Secondary | ICD-10-CM

## 2022-06-26 ENCOUNTER — Ambulatory Visit (INDEPENDENT_AMBULATORY_CARE_PROVIDER_SITE_OTHER): Payer: 59 | Admitting: Nurse Practitioner

## 2022-06-26 ENCOUNTER — Encounter: Payer: Self-pay | Admitting: Nurse Practitioner

## 2022-06-26 VITALS — BP 103/79 | HR 76 | Temp 98.2°F | Resp 20 | Ht 62.0 in | Wt 131.0 lb

## 2022-06-26 DIAGNOSIS — I10 Essential (primary) hypertension: Secondary | ICD-10-CM

## 2022-06-26 DIAGNOSIS — Q762 Congenital spondylolisthesis: Secondary | ICD-10-CM

## 2022-06-26 DIAGNOSIS — E785 Hyperlipidemia, unspecified: Secondary | ICD-10-CM

## 2022-06-26 DIAGNOSIS — I5032 Chronic diastolic (congestive) heart failure: Secondary | ICD-10-CM

## 2022-06-26 DIAGNOSIS — R0789 Other chest pain: Secondary | ICD-10-CM

## 2022-06-26 DIAGNOSIS — E876 Hypokalemia: Secondary | ICD-10-CM

## 2022-06-26 DIAGNOSIS — E663 Overweight: Secondary | ICD-10-CM

## 2022-06-26 DIAGNOSIS — G4733 Obstructive sleep apnea (adult) (pediatric): Secondary | ICD-10-CM

## 2022-06-26 DIAGNOSIS — M810 Age-related osteoporosis without current pathological fracture: Secondary | ICD-10-CM

## 2022-06-26 DIAGNOSIS — M47817 Spondylosis without myelopathy or radiculopathy, lumbosacral region: Secondary | ICD-10-CM

## 2022-06-26 DIAGNOSIS — R7303 Prediabetes: Secondary | ICD-10-CM

## 2022-06-26 DIAGNOSIS — F411 Generalized anxiety disorder: Secondary | ICD-10-CM

## 2022-06-26 DIAGNOSIS — K5790 Diverticulosis of intestine, part unspecified, without perforation or abscess without bleeding: Secondary | ICD-10-CM

## 2022-06-26 DIAGNOSIS — F5101 Primary insomnia: Secondary | ICD-10-CM

## 2022-06-26 DIAGNOSIS — R569 Unspecified convulsions: Secondary | ICD-10-CM

## 2022-06-26 DIAGNOSIS — B009 Herpesviral infection, unspecified: Secondary | ICD-10-CM

## 2022-06-26 DIAGNOSIS — K2100 Gastro-esophageal reflux disease with esophagitis, without bleeding: Secondary | ICD-10-CM

## 2022-06-26 DIAGNOSIS — F3341 Major depressive disorder, recurrent, in partial remission: Secondary | ICD-10-CM

## 2022-06-26 DIAGNOSIS — N941 Unspecified dyspareunia: Secondary | ICD-10-CM

## 2022-06-26 MED ORDER — HYDROCODONE-ACETAMINOPHEN 7.5-325 MG PO TABS: 1.0000 | ORAL_TABLET | Freq: Three times a day (TID) | ORAL | 0 refills | Status: AC

## 2022-06-26 MED ORDER — ISOSORBIDE MONONITRATE ER 30 MG PO TB24: 45.0000 mg | ORAL_TABLET | Freq: Every day | ORAL | 6 refills | Status: DC

## 2022-06-26 MED ORDER — ESTRADIOL 0.1 MG/GM VA CREA: 1.0000 | TOPICAL_CREAM | VAGINAL | 3 refills | Status: DC

## 2022-06-26 MED ORDER — ACYCLOVIR 5 % EX OINT: TOPICAL_OINTMENT | CUTANEOUS | 1 refills | Status: DC

## 2022-06-26 MED ORDER — TRAZODONE HCL 150 MG PO TABS: 150.0000 mg | ORAL_TABLET | Freq: Every day | ORAL | 1 refills | Status: DC

## 2022-06-26 MED ORDER — BUSPIRONE HCL 10 MG PO TABS: 10.0000 mg | ORAL_TABLET | Freq: Two times a day (BID) | ORAL | 5 refills | Status: DC | PRN

## 2022-06-26 MED ORDER — DEXLANSOPRAZOLE 60 MG PO CPDR: 1.0000 | DELAYED_RELEASE_CAPSULE | Freq: Every day | ORAL | 1 refills | Status: DC

## 2022-06-26 MED ORDER — ESCITALOPRAM OXALATE 20 MG PO TABS: 20.0000 mg | ORAL_TABLET | Freq: Every day | ORAL | 1 refills | Status: DC

## 2022-06-26 MED ORDER — LIDOCAINE 5 % EX OINT: TOPICAL_OINTMENT | CUTANEOUS | 1 refills | Status: DC

## 2022-06-26 MED ORDER — CARVEDILOL 3.125 MG PO TABS: ORAL_TABLET | ORAL | 1 refills | Status: DC

## 2022-06-26 MED ORDER — EMPAGLIFLOZIN 25 MG PO TABS: 25.0000 mg | ORAL_TABLET | Freq: Every day | ORAL | 0 refills | Status: DC

## 2022-06-26 MED ORDER — LIDOCAINE 5 % EX PTCH: 1.0000 | MEDICATED_PATCH | CUTANEOUS | 3 refills | Status: DC

## 2022-06-26 MED ORDER — BUTALBITAL-APAP-CAFFEINE 50-325-40 MG PO TABS: 1.0000 | ORAL_TABLET | Freq: Four times a day (QID) | ORAL | 2 refills | Status: DC | PRN

## 2022-06-26 MED ORDER — FUROSEMIDE 40 MG PO TABS: ORAL_TABLET | ORAL | 1 refills | Status: DC

## 2022-06-26 MED ORDER — LEVETIRACETAM 500 MG PO TABS: 500.0000 mg | ORAL_TABLET | Freq: Two times a day (BID) | ORAL | 1 refills | Status: DC

## 2022-06-26 MED ORDER — GLUCAGON EMERGENCY 1 MG IJ KIT: 1.0000 mg | PACK | INTRAMUSCULAR | 5 refills | Status: DC | PRN

## 2022-06-26 MED ORDER — VALACYCLOVIR HCL 1 G PO TABS: 1000.0000 mg | ORAL_TABLET | Freq: Three times a day (TID) | ORAL | 2 refills | Status: DC

## 2022-06-26 MED ORDER — HYDROCODONE-ACETAMINOPHEN 7.5-325 MG PO TABS: 1.0000 | ORAL_TABLET | Freq: Three times a day (TID) | ORAL | 0 refills | Status: DC

## 2022-06-26 MED ORDER — BD SWAB SINGLE USE REGULAR PADS: MEDICATED_PAD | 3 refills | Status: DC

## 2022-06-26 MED ORDER — DULOXETINE HCL 60 MG PO CPEP: 60.0000 mg | ORAL_CAPSULE | Freq: Every day | ORAL | 1 refills | Status: DC

## 2022-06-26 MED ORDER — LAMOTRIGINE 150 MG PO TABS: 150.0000 mg | ORAL_TABLET | Freq: Every day | ORAL | 1 refills | Status: DC

## 2022-06-26 MED ORDER — ROSUVASTATIN CALCIUM 10 MG PO TABS: 10.0000 mg | ORAL_TABLET | Freq: Every day | ORAL | 1 refills | Status: DC

## 2022-06-26 NOTE — Progress Notes (Signed)
Subjective:    Patient ID: [name], female    DOB: 29-Jul-1955, 67 y.o.   MRN: [id number]   Chief Complaint: No chief complaint on file.    HPI:  [name] is a 67 y.o. who identifies as a female who was assigned female at birth.   Social history: Lives with: her daughter lives with her Work history: disability   Comes in today for follow up of the following chronic medical issues:  1. Essential hypertension No c/o chest pain, sob or headache. Doe snot check blood pressure at home very often BP Readings from Last 3 Encounters:  03/27/22 (!) 146/84  02/28/22 124/86  12/03/21 107/75    2. Chronic diastolic CHF (congestive heart failure) (HCC) Has occasional edema 3. Obstructive sleep apnea syndrome Does not wear cpap 4. Diverticulosis No recent flare ups 5. Gastroesophageal reflux disease with esophagitis without hemorrhage Does well as long as takes dexilant.  Last dexascan as dine 08/24/21.  T score was -1.3 6. Age-related osteoporosis without current pathological fracture Does no weight bearing exercises. 7. SPONDYLOLISTHESIS Pain assessment: Cause of pain- multiple issues Pain location- back , hips, shoulders Pain on scale of 1-10- 7-8/10 Frequency- daily What increases pain-to much activity What makes pain Better-rest helps Effects on ADL - none Any change in general medical condition-none  Current opioids rx- norco 7.5/325 # meds rx- 90 Effectiveness of current meds-none Adverse reactions from pain meds-none Morphine equivalent- 22.5MME  Pill count performed-No Last drug screen - 03/27/22 ( high risk q47m, moderate risk q9m, low risk yearly ) Urine drug screen today- No Was the [facility] reviewed- yes  If yes were their any concerning findings? - no   Overdose risk: 1    09/12/2021    3:09 PM  Opioid Risk   Alcohol 0  Illegal Drugs 0  Rx Drugs 0  Alcohol 0  Illegal Drugs 0  Rx Drugs 0  Age between 16-45 years  0  History of  Preadolescent Sexual Abuse 3  Psychological Disease 2  ADD Negative  OCD Negative  Bipolar Positive  Depression 1  Opioid Risk Tool Scoring 66  Opioid Risk Interpretation Moderate Risk     Pain contract signed on:04/02/22  8. GAD (generalized anxiety disorder) Stays anxious. Is on lamictal which helps keep her calm    06/26/2022    3:14 PM 03/27/2022   11:49 AM 11/26/2021   11:32 AM 07/01/2021    1:59 PM  GAD 7 : Generalized Anxiety Score  Nervous, Anxious, on Edge 1 0 1 0  Control/stop worrying 0 0 1 0  Worry too much - different things 0 0 0 0  Trouble relaxing 0 0 1 0  Restless 0 0 0 0  Easily annoyed or irritable 0 0 0 0  Afraid - awful might happen 0 0 0 0  Total GAD 7 Score 1 0 3 0  Anxiety Difficulty Not difficult at all Not difficult at all Not difficult at all      9. Hyperlipidemia with target LDL less than 100 Does not watch diet very closely and does no dedicated exercise. Lab Results  Component Value Date   CHOL 153 11/26/2021   HDL 60 11/26/2021   LDLCALC 74 11/26/2021   LDLDIRECT 142.1 04/22/2007   TRIG 108 11/26/2021   CHOLHDL 2.6 11/26/2021    10. Hypokalemia Denies muscle cramps. Lab Results  Component Value Date   K 4.3 11/26/2021   11. Primary insomnia Takes trazadone to  sleep. Sleeps about 8 hours a night 12. Recurrent major depressive disorder, in partial remission ([place]) Again is on lamictal, buspar, cymbalta and lexapro. Doing well right nmow    06/26/2022    3:14 PM 03/27/2022   11:49 AM 11/26/2021   11:32 AM  Depression screen PHQ 2/9  Decreased Interest 1 0 0  Down, Depressed, Hopeless 0 0 0  PHQ - 2 Score 1 0 0  Altered sleeping 1 0 1  Tired, decreased energy 1 0 1  Change in appetite 1 0 0  Feeling bad or failure about yourself  0 0 0  Trouble concentrating 3 3 1   Moving slowly or fidgety/restless 3 0 0  Suicidal thoughts 0 0 0  PHQ-9 Score 10 3 3   Difficult doing work/chores Not difficult at all Not difficult at all Not  difficult at all    13. Seizures ([facility]) No recent seizure activity  14 . Overweight (BMI 25.0-29.9) No recent weight changes Wt Readings from Last 3 Encounters:  06/26/22 131 lb (59.4 kg)  03/27/22 126 lb (57.2 kg)  02/28/22 120 lb (54.4 kg)   BMI Readings from Last 3 Encounters:  06/26/22 23.96 kg/m  03/27/22 23.05 kg/m  02/28/22 21.95 kg/m    Patient comes in for follow up of chronic medical problems. She has a very extensive list of medical issues. She is doing well. Still having chronic back pain and muscle pain. Has frequent fatigue. She saw cardiology on 02/28/22, still having syncope and they have referred he rto specialist in [place] to see if they can determine cause of episodes. Her only complaint is her bursitis that she has been seeing ortho for.   New complaints: None today  Allergies  Allergen Reactions   Codeine Other (See Comments)    "I will have a heart attack."   Morphine And Related Other (See Comments)    "It will cause me to have a heart attack."   Ambien [Zolpidem Tartrate] Nausea And Vomiting   Clonidine Derivatives Nausea And Vomiting    gerd - caused acid reflux per pt   Metformin And Related Nausea And Vomiting    cramping from metformin   Lyrica [Pregabalin] Swelling and Other (See Comments)    Weight gain   Neurontin [Gabapentin] Other (See Comments)    Causes elevated LFTs    Outpatient Encounter Medications as of 06/26/2022  Medication Sig   Accu-Chek Softclix Lancets lancets TEST BLOOD SUGAR UP TO 4 TIMES A DAY AS DIRECTED. R73.03   acyclovir ointment (ZOVIRAX) 5 % APPLY TOPICALLY EVERY 3 HOURS.   albuterol (VENTOLIN HFA) 108 (90 Base) MCG/ACT inhaler INHALE 2 PUFFS EVERY 6 HOURS AS NEEDED FOR SHORTNESS OF BREATH AND WHEEZING.   Alcohol Swabs (B-D SINGLE USE SWABS REGULAR) PADS USE 1 PAD DAILY WHEN CHECKING BLOOD SUGAR. R73.03   aspirin EC 81 MG EC tablet Take 1 tablet (81 mg total) by mouth daily. Swallow whole.   blood glucose meter  kit and supplies Dispense based on patient and insurance preference. Use up to four times daily as directed. (FOR ICD-10 E10.9, E11.9).   busPIRone (BUSPAR) 10 MG tablet Take 1 tablet (10 mg total) by mouth 2 (two) times daily as needed.   butalbital-acetaminophen-caffeine (FIORICET) 50-325-40 MG tablet TAKE 1 TABLET BY MOUTH EVERY 6 HOURS AS NEEDED FOR HEADACHE.   carvedilol (COREG) 3.125 MG tablet TAKE 1/2 (0.5) TABLET TWICE A DAY WITH A MEAL; BLOOD PRESSURE OVER 150 BEFORE TAKING.   celecoxib (CELEBREX) 200 MG  capsule TAKE 1 CAPSULE BY MOUTH TWICE DAILY BETWEEN MEALS AS NEEDED.   Deutetrabenazine (AUSTEDO) 6 MG TABS Take 1 tablet by mouth 2 (two) times daily.   dexlansoprazole (DEXILANT) 60 MG capsule Take 1 capsule (60 mg total) by mouth daily.   diclofenac Sodium (VOLTAREN) 1 % GEL APPLY 4 GRAMS TOPICALLY 4 TIMES A DAY. (Patient taking differently: Apply 4 g topically 4 (four) times daily as needed (pain).)   doxycycline (VIBRA-TABS) 100 MG tablet Take 1 tablet (100 mg total) by mouth 2 (two) times daily.   DULoxetine (CYMBALTA) 60 MG capsule Take 1 capsule (60 mg total) by mouth at bedtime.   escitalopram (LEXAPRO) 20 MG tablet Take 1 tablet (20 mg total) by mouth daily.   estradiol (ESTRACE) 0.1 MG/GM vaginal cream USE 1 APPLICATORFUL IN VAGINA AT BEDTIME AS DIRECTED.   FIBER PO Take 1 tablet by mouth in the morning and at bedtime.   fludrocortisone (FLORINEF) 0.1 MG tablet Take 1 tablet (100 mcg total) by mouth every other day.   fluticasone (FLONASE) 50 MCG/ACT nasal spray USE 1 SPRAY IN EACH NOSTRIL ONCE DAILY. (Patient taking differently: Place 1 spray into both nostrils daily as needed for allergies.)   fluticasone furoate-vilanterol (BREO ELLIPTA) 100-25 MCG/ACT AEPB Inhale 1 puff into the lungs daily.   furosemide (LASIX) 40 MG tablet TAKE 1 TABLET DAILY AS NEEDED FOR EXCESSIVE FLUID.   glucose blood (ACCU-CHEK GUIDE) test strip TEST BLOOD SUGAR UP TO 4 TIMES A DAY AS DIRECTED. R73.03    hydrALAZINE (APRESOLINE) 25 MG tablet TAKE 1 TABLET BY MOUTH 3 TIMES DAILY AS NEEDED (FOR SEVERE HYPERTENSION/SYSTOLIC NUMBER [id number] OR GREATER).   HYDROcodone-acetaminophen (NORCO) 7.5-325 MG tablet TAKE 1 TABLET EVERY MORNING, AT NOON AND AT BEDTIME.   Ibuprofen-diphenhydrAMINE HCl (ADVIL PM) 200-25 MG CAPS Take 2 capsules by mouth at bedtime.   ipratropium (ATROVENT) 0.02 % nebulizer solution USE 1 VIAL (3ML) IN NEBULIZER EVERY 6 HOURS AS NEEDED FOR WHEEZING OR SHORTNESS OF BREATH.   isosorbide mononitrate (IMDUR) 30 MG 24 hr tablet Take 1.5 tablets (45 mg total) by mouth daily.   JARDIANCE 25 MG TABS tablet TAKE 1 TABLET BY MOUTH DAILY BEFORE BREAKFAST.   lamoTRIgine (LAMICTAL) 150 MG tablet TAKE 1 TABLET ONCE DAILY.   levETIRAcetam (KEPPRA) 500 MG tablet Take 1 tablet (500 mg total) by mouth 2 (two) times daily.   levocetirizine (XYZAL) 5 MG tablet TAKE 1 TABLET BY MOUTH EVERY MORNING.   lidocaine (LIDODERM) 5 % Place 1 patch onto the skin daily. Remove & Discard patch within 12 hours or as directed by MD (Patient taking differently: Place 1 patch onto the skin daily as needed (pain). Remove & Discard patch within 12 hours or as directed by MD)   lidocaine (XYLOCAINE) 5 % ointment APPLY TO AFFECTED AREA 3 TIMES A DAY AS NEEDED FOR MILD OR MODERATE PAIN.   methocarbamol (ROBAXIN) 500 MG tablet Take 1 tablet (500 mg total) by mouth every 8 (eight) hours as needed for muscle spasms.   methocarbamol (ROBAXIN) 750 MG tablet Take 1 tablet (750 mg total) by mouth every 12 (twelve) hours as needed for muscle spasms.   metroNIDAZOLE (FLAGYL) 500 MG tablet Take 1 tablet (500 mg total) by mouth 2 (two) times daily.   nitroGLYCERIN (NITROSTAT) 0.4 MG SL tablet PLACE ONE (1) TABLET UNDER TONGUE EVERY 5 MINUTES UP TO (3) DOSES AS NEEDED FOR CHEST PAIN.   ondansetron (ZOFRAN ODT) 4 MG disintegrating tablet Take 1 tablet (4 mg total)  by mouth every 8 (eight) hours as needed for nausea or vomiting.   ondansetron  (ZOFRAN) 4 MG tablet TAKE 1 TABLET BY MOUTH EVERY 8 HOURS AS NEEDED FOR NAUSEA AND VOMITING.   oxybutynin (DITROPAN-XL) 5 MG 24 hr tablet Take 1 tablet (5 mg total) by mouth at bedtime.   predniSONE (STERAPRED UNI-PAK 21 TAB) 10 MG (21) TBPK tablet Use as directed   Probiotic Product (PROBIOTIC PO) Take 1 capsule by mouth daily.   promethazine-dextromethorphan (PROMETHAZINE-DM) 6.25-15 MG/5ML syrup Take 5 mLs by mouth 3 (three) times daily as needed for cough.   rosuvastatin (CRESTOR) 10 MG tablet Take 1 tablet (10 mg total) by mouth daily.   tetrabenazine (XENAZINE) 12.5 MG tablet Take 1 tablet (12.5 mg total) by mouth daily.   traZODone (DESYREL) 150 MG tablet Take 1 tablet (150 mg total) by mouth at bedtime.   valACYclovir (VALTREX) 1000 MG tablet Take 1 tablet (1,000 mg total) by mouth 3 (three) times daily.   valACYclovir (VALTREX) 500 MG tablet TAKE (1) TABLET TWICE DAILY.   No facility-administered encounter medications on file as of 06/26/2022.    Past Surgical History:  Procedure Laterality Date   BACK SURGERY     BREAST EXCISIONAL BIOPSY Right    1990s   BREAST SURGERY     lumpectomy   CARDIAC CATHETERIZATION  10/06/2011   CATARACT EXTRACTION W/PHACO Right 07/31/2017   Procedure: CATARACT EXTRACTION PHACO AND INTRAOCULAR LENS PLACEMENT (IOC);  Surgeon: [name], MD;  Location: [facility];  Service: Ophthalmology;  Laterality: Right;  CDE: 2.33   CATARACT EXTRACTION W/PHACO Left 08/14/2017   Procedure: CATARACT EXTRACTION PHACO AND INTRAOCULAR LENS PLACEMENT (IOC);  Surgeon: [name], MD;  Location: [facility];  Service: Ophthalmology;  Laterality: Left;  CDE: 2.74   CHOLECYSTECTOMY     CYSTOSCOPY     stone   DIAGNOSTIC LAPAROSCOPY     laparoscopic cholecystectomy   DOPPLER ECHOCARDIOGRAPHY  2009   ESOPHAGOGASTRODUODENOSCOPY N/A 10/31/2020   Procedure: ESOPHAGOGASTRODUODENOSCOPY (EGD);  Surgeon: Greer Pickerel, MD;  Location: [facility];  Service: General;  Laterality: N/A;    EYE SURGERY Bilateral 08/14/2017   cataract removal   head up tilt table testing  06/15/2007   [name]   HEMORRHOID SURGERY     HERNIA REPAIR     insertion of implatable loop recorder  08/11/2007   [name]   POSTERIOR CERVICAL FUSION/FORAMINOTOMY N/A 12/19/2013   Procedure: RIGHT C3-4.C4-5 AND C5-6 FORAMINOTOMIES;  Surgeon: [name], MD;  Location: [facility];  Service: Orthopedics;  Laterality: N/A;   TOTAL HIP ARTHROPLASTY Left 01/31/2020   Procedure: LEFT TOTAL HIP ARTHROPLASTY ANTERIOR APPROACH;  Surgeon: Mcarthur Rossetti, MD;  Location: [facility];  Service: Orthopedics;  Laterality: Left;   TOTAL SHOULDER ARTHROPLASTY Right 11/15/2019   Procedure: RIGHT TOTAL SHOULDER ARTHROPLASTY;  Surgeon: Meredith Pel, MD;  Location: [facility];  Service: Orthopedics;  Laterality: Right;   TOTAL SHOULDER REVISION Right 06/11/2021   Procedure: RIGHT SHOULDER CONVERSION TOTAL SHOULDER ARTHROPLASTY to REVERSE TOTAL SHOULDER ARTHROPLASTY;  Surgeon: Meredith Pel, MD;  Location: [facility];  Service: Orthopedics;  Laterality: Right;   TUBAL LIGATION     UPPER GASTROINTESTINAL ENDOSCOPY     XI ROBOTIC ASSISTED HIATAL HERNIA REPAIR N/A 10/31/2020   Procedure: XI ROBOTIC ASSISTED HIATAL HERNIA REPAIR WITH PARTIAL FUNDOPLICATION;  Surgeon: Greer Pickerel, MD;  Location: [facility];  Service: General;  Laterality: N/A;    Family History  Problem Relation Age of Onset  Heart attack Father    Mental illness Father    Mental illness Mother    Heart attack Brother        stents   Alcohol abuse Brother    Heart disease Brother    Drug abuse Brother    Diabetes Brother    Colon cancer Maternal Aunt    Cirrhosis Brother    Stomach cancer Neg Hx    Esophageal cancer Neg Hx    Rectal cancer Neg Hx       Controlled substance contract: 04/02/22     Review of Systems  Constitutional:  Positive for fatigue. Negative for activity change, appetite change and diaphoresis.  Eyes:  Negative for  pain.  Respiratory:  Negative for shortness of breath.   Cardiovascular:  Negative for chest pain, palpitations and leg swelling.  Gastrointestinal:  Negative for abdominal pain.  Endocrine: Negative for polydipsia.  Musculoskeletal:  Positive for arthralgias (shoulder) and back pain (chronic).  Skin:  Negative for rash.  Neurological:  Negative for dizziness, weakness and headaches.  Hematological:  Does not bruise/bleed easily.  All other systems reviewed and are negative.      Objective:   Physical Exam Vitals and nursing note reviewed.  Constitutional:      General: She is not in acute distress.    Appearance: Normal appearance. She is well-developed.  HENT:     Head: Normocephalic.     Right Ear: Tympanic membrane normal.     Left Ear: Tympanic membrane normal.     Nose: Nose normal.     Mouth/Throat:     Mouth: Mucous membranes are moist.  Eyes:     Pupils: Pupils are equal, round, and reactive to light.  Neck:     Vascular: No carotid bruit or JVD.  Cardiovascular:     Rate and Rhythm: Normal rate and regular rhythm.     Heart sounds: Normal heart sounds.  Pulmonary:     Effort: Pulmonary effort is normal. No respiratory distress.     Breath sounds: Normal breath sounds. No wheezing or rales.  Chest:     Chest wall: No tenderness.  Abdominal:     General: Bowel sounds are normal. There is no distension or abdominal bruit.     Palpations: Abdomen is soft. There is no hepatomegaly, splenomegaly, mass or pulsatile mass.     Tenderness: There is no abdominal tenderness.  Musculoskeletal:        General: Normal range of motion.     Cervical back: Normal range of motion and neck supple.  Lymphadenopathy:     Cervical: No cervical adenopathy.  Skin:    General: Skin is warm and dry.  Neurological:     Mental Status: She is alert and oriented to person, place, and time.     Deep Tendon Reflexes: Reflexes are normal and symmetric.  Psychiatric:        Behavior:  Behavior normal.        Thought Content: Thought content normal.        Judgment: Judgment normal.    BP 103/79   Pulse 76   Temp 98.2 F (36.8 C) (Temporal)   Resp 20   Ht 5\' 2"  (1.575 m)   Wt 131 lb (59.4 kg)   SpO2 91%   BMI 23.96 kg/m         Assessment & Plan:  PHUNG CHILDRESS comes in today with chief complaint of Medical Management of Chronic Issues   Diagnosis and  orders addressed:  1. Essential hypertension Lowsodium diet - carvedilol (COREG) 3.125 MG tablet; TAKE 1/2 (0.5) TABLET TWICE A DAY WITH A MEAL; BLOOD PRESSURE OVER 150 BEFORE TAKING.  Dispense: 90 tablet; Refill: 1 - furosemide (LASIX) 40 MG tablet; TAKE 1 TABLET DAILY AS NEEDED FOR EXCESSIVE FLUID.  Dispense: 30 tablet; Refill: 1 - CBC with Differential/Platelet - CMP14+EGFR  2. Chronic diastolic CHF (congestive heart failure) ([place])  3. Obstructive sleep apnea syndrome Wear cpap  4. Diverticulosis Watch diet to prevent flare up  5. Gastroesophageal reflux disease with esophagitis without hemorrhage Avoid spicy foods Do not eat 2 hours prior to bedtime  - dexlansoprazole (DEXILANT) 60 MG capsule; Take 1 capsule (60 mg total) by mouth daily.  Dispense: 90 capsule; Refill: 1  6. Age-related osteoporosis without current pathological fracture Weight bearing exercise   7. SPONDYLOLISTHESIS - HYDROcodone-acetaminophen (NORCO) 7.5-325 MG tablet; Take 1 tablet by mouth in the morning, at noon, and at bedtime.  Dispense: 90 tablet; Refill: 0 - HYDROcodone-acetaminophen (NORCO) 7.5-325 MG tablet; Take 1 tablet by mouth in the morning, at noon, and at bedtime.  Dispense: 90 tablet; Refill: 0 - HYDROcodone-acetaminophen (NORCO) 7.5-325 MG tablet; Take 1 tablet by mouth in the morning, at noon, and at bedtime.  Dispense: 90 tablet; Refill: 0  8. GAD (generalized anxiety disorder) Stress management - lamoTRIgine (LAMICTAL) 150 MG tablet; Take 1 tablet (150 mg total) by mouth daily.  Dispense: 30 tablet;  Refill: 1  9. Hyperlipidemia with target LDL less than 100 Low fat diet - rosuvastatin (CRESTOR) 10 MG tablet; Take 1 tablet (10 mg total) by mouth daily.  Dispense: 90 tablet; Refill: 1 - Lipid panel  10. Hypokalemia Labs  pending  11. Primary insomnia Bedtoime routine - traZODone (DESYREL) 150 MG tablet; Take 1 tablet (150 mg total) by mouth at bedtime.  Dispense: 90 tablet; Refill: 1  12. Recurrent major depressive disorder, in partial remission (HCC) Stress management - busPIRone (BUSPAR) 10 MG tablet; Take 1 tablet (10 mg total) by mouth 2 (two) times daily as needed.  Dispense: 60 tablet; Refill: 5 - DULoxetine (CYMBALTA) 60 MG capsule; Take 1 capsule (60 mg total) by mouth at bedtime.  Dispense: 90 capsule; Refill: 1 - escitalopram (LEXAPRO) 20 MG tablet; Take 1 tablet (20 mg total) by mouth daily.  Dispense: 90 tablet; Refill: 1  13. Seizures ([place]) Report any seizure activity - levETIRAcetam (KEPPRA) 500 MG tablet; Take 1 tablet (500 mg total) by mouth 2 (two) times daily.  Dispense: 180 tablet; Refill: 1  14. Overweight (BMI 25.0-29.9) Discussed diet and exercise for person with BMI >25 Will recheck weight in 3-6 months   15. Dyspareunia in female - estradiol (ESTRACE) 0.1 MG/GM vaginal cream; Place 1 Applicatorful vaginally 3 (three) times a week.  Dispense: 42.5 g; Refill: 3  16. Other chest pain - isosorbide mononitrate (IMDUR) 30 MG 24 hr tablet; Take 1.5 tablets (45 mg total) by mouth daily.  Dispense: 45 tablet; Refill: 6  17. Pre-diabetes - empagliflozin (JARDIANCE) 25 MG TABS tablet; Take 1 tablet (25 mg total) by mouth daily before breakfast.  Dispense: 30 tablet; Refill: 0  18. Lumbosacral spondylosis without myelopathy  19. Herpes simplex virus (HSV) infection - acyclovir ointment (ZOVIRAX) 5 %; APPLY TOPICALLY EVERY 3 HOURS.  Dispense: 30 g; Refill: 1 - valACYclovir (VALTREX) 1000 MG tablet; Take 1 tablet (1,000 mg total) by mouth 3 (three) times daily.   Dispense: 21 tablet; Refill: 2 - lidocaine (LIDODERM) 5 %; Place 1  patch onto the skin daily. Remove & Discard patch within 12 hours or as directed by MD  Dispense: 30 patch; Refill: 3 - lidocaine (XYLOCAINE) 5 % ointment; APPLY TO AFFECTED AREA 3 TIMES A DAY AS NEEDED FOR MILD OR MODERATE PAIN.  Dispense: 35.44 g; Refill: 1   Labs pending Health Maintenance reviewed Diet and exercise encouraged  Follow up plan: 3 month- pain management   [name] Hassell Done, FNP

## 2022-06-27 LAB — CMP14+EGFR
ALT: 270 IU/L — ABNORMAL HIGH (ref 0–32)
AST: 383 IU/L — ABNORMAL HIGH (ref 0–40)
Albumin/Globulin Ratio: 1.8 (ref 1.2–2.2)
Albumin: 4.2 g/dL (ref 3.9–4.9)
Alkaline Phosphatase: 234 IU/L — ABNORMAL HIGH (ref 44–121)
BUN/Creatinine Ratio: 19 (ref 12–28)
BUN: 13 mg/dL (ref 8–27)
Bilirubin Total: 0.2 mg/dL (ref 0.0–1.2)
CO2: 25 mmol/L (ref 20–29)
Calcium: 9.6 mg/dL (ref 8.7–10.3)
Chloride: 103 mmol/L (ref 96–106)
Creatinine, Ser: 0.69 mg/dL (ref 0.57–1.00)
Globulin, Total: 2.4 g/dL (ref 1.5–4.5)
Glucose: 89 mg/dL (ref 70–99)
Potassium: 4.3 mmol/L (ref 3.5–5.2)
Sodium: 144 mmol/L (ref 134–144)
Total Protein: 6.6 g/dL (ref 6.0–8.5)
eGFR: 96 mL/min/{1.73_m2} (ref >59)

## 2022-06-27 LAB — CBC WITH DIFFERENTIAL/PLATELET
Basophils Absolute: 0 10*3/uL (ref 0.0–0.2)
Basos: 1 %
EOS (ABSOLUTE): 0.1 10*3/uL (ref 0.0–0.4)
Eos: 2 %
Hematocrit: 44.9 % (ref 34.0–46.6)
Hemoglobin: 15.1 g/dL (ref 11.1–15.9)
Immature Grans (Abs): 0 10*3/uL (ref 0.0–0.1)
Immature Granulocytes: 1 %
Lymphocytes Absolute: 1.9 10*3/uL (ref 0.7–3.1)
Lymphs: 33 %
MCH: 32.3 pg (ref 26.6–33.0)
MCHC: 33.6 g/dL (ref 31.5–35.7)
MCV: 96 fL (ref 79–97)
Monocytes Absolute: 0.5 10*3/uL (ref 0.1–0.9)
Monocytes: 9 %
Neutrophils Absolute: 3.2 10*3/uL (ref 1.4–7.0)
Neutrophils: 54 %
Platelets: 184 10*3/uL (ref 150–450)
RBC: 4.67 x10E6/uL (ref 3.77–5.28)
RDW: 12.5 % (ref 11.7–15.4)
WBC: 5.8 10*3/uL (ref 3.4–10.8)

## 2022-06-27 LAB — LIPID PANEL
Chol/HDL Ratio: 2.5 ratio (ref 0.0–4.4)
Cholesterol, Total: 160 mg/dL (ref 100–199)
HDL: 63 mg/dL (ref >39)
LDL Chol Calc (NIH): 76 mg/dL (ref 0–99)
Triglycerides: 117 mg/dL (ref 0–149)
VLDL Cholesterol Cal: 21 mg/dL (ref 5–40)

## 2022-06-30 ENCOUNTER — Other Ambulatory Visit: Payer: Self-pay

## 2022-06-30 DIAGNOSIS — R748 Abnormal levels of other serum enzymes: Secondary | ICD-10-CM

## 2022-06-30 HISTORY — DX: Abnormal levels of other serum enzymes: R74.8

## 2022-07-01 ENCOUNTER — Other Ambulatory Visit: Payer: 59

## 2022-07-01 DIAGNOSIS — R748 Abnormal levels of other serum enzymes: Secondary | ICD-10-CM

## 2022-07-02 ENCOUNTER — Encounter: Payer: Self-pay | Admitting: Sports Medicine

## 2022-07-02 LAB — CMP14+EGFR
ALT: 58 IU/L — ABNORMAL HIGH (ref 0–32)
AST: 27 IU/L (ref 0–40)
Albumin/Globulin Ratio: 1.9 (ref 1.2–2.2)
Albumin: 4.1 g/dL (ref 3.9–4.9)
Alkaline Phosphatase: 135 IU/L — ABNORMAL HIGH (ref 44–121)
BUN/Creatinine Ratio: 14 (ref 12–28)
BUN: 12 mg/dL (ref 8–27)
Bilirubin Total: 0.3 mg/dL (ref 0.0–1.2)
CO2: 25 mmol/L (ref 20–29)
Calcium: 9.8 mg/dL (ref 8.7–10.3)
Chloride: 106 mmol/L (ref 96–106)
Creatinine, Ser: 0.86 mg/dL (ref 0.57–1.00)
Globulin, Total: 2.2 g/dL (ref 1.5–4.5)
Glucose: 110 mg/dL — ABNORMAL HIGH (ref 70–99)
Potassium: 4.6 mmol/L (ref 3.5–5.2)
Sodium: 146 mmol/L — ABNORMAL HIGH (ref 134–144)
Total Protein: 6.3 g/dL (ref 6.0–8.5)
eGFR: 75 mL/min/{1.73_m2} (ref >59)

## 2022-07-03 ENCOUNTER — Encounter: Payer: Self-pay | Admitting: Sports Medicine

## 2022-07-03 ENCOUNTER — Other Ambulatory Visit: Payer: Self-pay

## 2022-07-03 ENCOUNTER — Ambulatory Visit (INDEPENDENT_AMBULATORY_CARE_PROVIDER_SITE_OTHER): Payer: 59 | Admitting: Sports Medicine

## 2022-07-03 DIAGNOSIS — M7062 Trochanteric bursitis, left hip: Secondary | ICD-10-CM

## 2022-07-03 DIAGNOSIS — Z96642 Presence of left artificial hip joint: Secondary | ICD-10-CM

## 2022-07-03 DIAGNOSIS — M7061 Trochanteric bursitis, right hip: Secondary | ICD-10-CM | POA: Diagnosis not present

## 2022-07-03 DIAGNOSIS — M25552 Pain in left hip: Secondary | ICD-10-CM | POA: Diagnosis not present

## 2022-07-03 DIAGNOSIS — M25551 Pain in right hip: Secondary | ICD-10-CM | POA: Diagnosis not present

## 2022-07-03 DIAGNOSIS — M79645 Pain in left finger(s): Secondary | ICD-10-CM

## 2022-07-03 DIAGNOSIS — G8929 Other chronic pain: Secondary | ICD-10-CM

## 2022-07-03 DIAGNOSIS — M79644 Pain in right finger(s): Secondary | ICD-10-CM

## 2022-07-03 MED ORDER — BUPIVACAINE HCL 0.25 % IJ SOLN
2.0000 mL | INTRAMUSCULAR | Status: AC | PRN
  Administered 2022-07-03: 2 mL via INTRA_ARTICULAR

## 2022-07-03 MED ORDER — KETOROLAC TROMETHAMINE 30 MG/ML IJ SOLN
30.0000 mg | INTRAMUSCULAR | Status: AC | PRN
  Administered 2022-07-03: 30 mg via INTRA_ARTICULAR

## 2022-07-03 MED ORDER — LIDOCAINE HCL 1 % IJ SOLN
2.0000 mL | INTRAMUSCULAR | Status: AC | PRN
  Administered 2022-07-03: 2 mL

## 2022-07-03 MED ORDER — LIDOCAINE HCL 1 % IJ SOLN
2.0000 mL | INTRAMUSCULAR | Status: AC | PRN
Start: 2022-07-03 — End: 2022-07-03
  Administered 2022-07-03: 2 mL

## 2022-07-03 NOTE — Addendum Note (Signed)
Addended by: Renne Musca III on: 07/03/2022 11:01 PM   Modules accepted: Level of Service

## 2022-07-03 NOTE — Progress Notes (Signed)
[name] - 67 y.o. female MRN [id number]  Date of birth: 03-21-1956  Office Visit Note: Visit Date: 07/03/2022 PCP: Chevis Pretty, FNP Referred by: Hassell Done, [name], *  Subjective: Chief Complaint  Patient presents with   Right Hip - Pain   Left Hip - Pain   HPI: [name] is a pleasant 67 y.o. female who presents today for acute on chronic bilateral lateral hip pain.  [name] underwent bilateral CS trochanteric bursa injections on 05/13/2022.  Did give her good relief, however over the last week she has had an exacerbation of her pain.  She tells me today that she used to get trochanteric injections only once a year, however after her left Hip replacement, she no longer has any joint pain but will get with more frequent exacerbations of her lateral hip pain which she has been needing to get injections about 3 months or so.  Her pain has flared up over this last week.  She is having difficulty sleeping the hip.  No redness or swelling.  She does take hydrocodone-acetaminophen 7.5-325 mg 3 times a day.  Also using topical lidocaine patches.  Pertinent ROS were reviewed with the patient and found to be negative unless otherwise specified above in HPI.   Assessment & Plan: Visit Diagnoses:  1. Greater trochanteric pain syndrome of both lower extremities   2. Trochanteric bursitis, left hip   3. Trochanteric bursitis, right hip   4. History of left hip replacement   5. Chronic thumb pain, bilateral    Plan: Discussed with butterflied that she unfortunately has had an exacerbation of her chronic bilateral greater trochanteric pain syndrome with associated bursitis.  She is now having pain or difficulty sleeping on the side of the hip.  Discussed all treatment options such as oral medication therapy, injection therapy.  She would like to proceed with injection therapy, however discussed it is too soon to repeat cortisone injection.  We will do a prolotherapy  injection with associated Toradol.  Using ultrasound guidance we did proceed with bilateral greater trochanteric injections, tolerated well.  I would like her to continue her hydrocodone-acetaminophen 7.5-325 mg 3 times a day for pain control.  May use topical lidocaine patches as well.  May use Tylenol and/or ice for any postinjection pain.  We will see how this does better over the coming weeks.  We can consider repeat corticosteroid injection sometime over the next 4-6 weeks.  Did discuss with her if we are needing to give these at this frequency, may need to obtain an MRI of the hips/pelvis to evaluate for gluteal musculature and/or tendon tearing.  She also mentions some bilateral thumb pain that she would like evaluated, she may return at her leisure for this.  Follow-up: Return for with Rolena Infante for b/l thumbs new problem (30-mins for injections) - will need x-rays.   Meds & Orders: No orders of the defined types were placed in this encounter.   Orders Placed This Encounter  Procedures   Large Joint Inj   Large Joint Inj   US Guided Needle Placement - No Linked Charges     Procedures: Large Joint Inj: R greater trochanter on 07/03/2022 1:15 PM Indications: pain Details: 22 G 3.5 in needle, ultrasound-guided lateral approach Medications: 2 mL lidocaine 1 %; 2 mL bupivacaine 0.25 %; 30 mg ketorolac 30 MG/ML Outcome: tolerated well, no immediate complications  US-Guided Greater Trochanteric Bursa Injection, Right After discussion on risks/benefits/indications and informed verbal consent was obtained, a  timeout was performed. The patient was lying in lateral recumbent position on exam table. Using ultrasound guidance, the greater trochanter was identified. The area overlying the trochanteric bursa was then prepped with Betadine and alcohol swabs. Following sterile precautions, ultrasound was reapplied to visualize needle guidance with a 22-gauge 3.5" needle utilizing an in-plane approach to  inject the bursa with 2:2:2:1 lidocaine:bupivicaine:D5W:Toradol. Delivery of the injectate was visualized into the region of hypoechoic fluid of the greater trochanteric bursa. Patient tolerated procedure well without immediate complications.    Procedure, treatment alternatives, risks and benefits explained, specific risks discussed. Consent was given by the patient. Immediately prior to procedure a time out was called to verify the correct patient, procedure, equipment, support staff and site/side marked as required. Patient was prepped and draped in the usual sterile fashion.    Large Joint Inj: L greater trochanter on 07/03/2022 1:16 PM Indications: pain Details: 22 G 3.5 in needle, ultrasound-guided lateral approach Medications: 2 mL lidocaine 1 %; 2 mL bupivacaine 0.25 %; 30 mg ketorolac 30 MG/ML Outcome: tolerated well, no immediate complications  US-Guided Greater Trochanteric Bursa Injection, Left After discussion on risks/benefits/indications and informed verbal consent was obtained, a timeout was performed. The patient was lying in lateral recumbent position on exam table. Using ultrasound guidance, the greater trochanter was identified. The area overlying the trochanteric bursa was then prepped with Betadine and alcohol swabs. Following sterile precautions, ultrasound was reapplied to visualize needle guidance with a 22-gauge 3.5" needle utilizing an in-plane approach to inject the bursa with 2:2:2:1 lidocaine:bupivicaine:D5W:Toradol. Delivery of the injectate was visualized into the region of hypoechoic fluid of the greater trochanteric bursa. Patient tolerated procedure well without immediate complications.    Procedure, treatment alternatives, risks and benefits explained, specific risks discussed. Consent was given by the patient. Immediately prior to procedure a time out was called to verify the correct patient, procedure, equipment, support staff and site/side marked as required.  Patient was prepped and draped in the usual sterile fashion.          Clinical History: No specialty comments available.  She reports that she has never smoked. She has never used smokeless tobacco.  Recent Labs    11/26/21 1213  HGBA1C 5.6    Objective:    Physical Exam  Gen: Well-appearing, in no acute distress; non-toxic CV: Regular Rate. Well-perfused. Warm.  Resp: Breathing unlabored on room air; no wheezing. Psych: Fluid speech in conversation; appropriate affect; normal thought process Neuro: Sensation intact throughout. No gross coordination deficits.   Ortho Exam -Bilateral hips: There is exquisite TTP over the posterior lateral aspect of both greater trochanters.  No overlying redness or swelling.  The left hip has a well-healed incision over the anterior aspect of the hip from prior THA, no signs of infection.  There are no mechanical blocks to internal or external rotation.  There is weakness with associated pain with resisted hip abduction.  Hard to decipher whether this is a pain response or true weakness. NVI.  Imaging:  Narrative & Impression  CLINICAL DATA:  Left hip pain for 2 years   EXAM: MR OF THE LEFT HIP WITHOUT CONTRAST   TECHNIQUE: Multiplanar, multisequence MR imaging was performed. No intravenous contrast was administered.   COMPARISON:  Radiographs 04/15/2021 and CT pelvis 04/14/2019   FINDINGS: Bones: Left total hip prosthesis in place with expected resulting metal artifact. There is also metal artifact from lower lumbar hardware. Accounting from the distortion and field originate a related to the metal  artifact, no significant abnormal regional marrow edema is observed. Small bone islands in the left iliac bone and in the left anterior acetabular wall noted.   Articular cartilage and labrum   Articular cartilage:  N/A   Labrum:  N/A   Joint or bursal effusion   Joint effusion:  Absent   Bursae: There is some trace fluid signal  along the distal gluteus minimus tendon on the left, for example on images 12-13 of series 10, although no overt bursitis. No significant abnormal periarticular fluid collection identified.   Muscles and tendons   Muscles and tendons:  Unremarkable   Other findings   Miscellaneous: No left sciatic nerve impingement. Sigmoid colon diverticulosis.   IMPRESSION: 1. Left total hip prosthesis in place with expected resulting metal artifact. 2. There is some trace fluid signal along the distal gluteus minimus tendon on the left, although no overt bursitis is identified. 3. No significant abnormal periprosthetic fluid collection. 4. Sigmoid colon diverticulosis.     Electronically Signed   By: Van Clines M.D.   On: 02/03/2022 10:42    Past Medical/Family/Surgical/Social History: Medications & Allergies reviewed per EMR, new medications updated. 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   Hypokalemia 11/25/2020   S/P laparoscopic fundoplication [id number]   Status post total replacement of left hip 01/31/2020   Unilateral primary osteoarthritis, left hip 12/21/2019   S/P shoulder replacement, right 11/15/2019   Overweight (BMI 25.0-29.9) 04/15/2019   Diverticulosis 04/14/2019   Chronic diastolic CHF (congestive heart failure) 04/14/2019   Unstable angina 10/20/2017   Chronic pain 09/23/2016   Pre-diabetes 09/02/2016   Recurrent major depressive disorder, in partial remission 07/14/2016   Seizures 07/14/2016   GAD (generalized anxiety disorder) 07/14/2016   Insomnia 09/05/2014   Allergic rhinitis 09/05/2014   Cervical spondylosis without myelopathy 12/19/2013    Class: Chronic   Neural foraminal stenosis of cervical spine 12/19/2013   Cervical radiculitis 09/19/2013   Lumbosacral spondylosis without myelopathy 11/16/2012   Postlaminectomy syndrome,  lumbar region 11/16/2012   Herpes simplex virus (HSV) infection 10/31/2008   Hyperlipidemia with target LDL less than 100 10/31/2008   Essential hypertension 10/31/2008   GERD 10/31/2008   SPINAL STENOSIS OF LUMBAR REGION 10/31/2008   Myalgia 10/31/2008   Osteoporosis 10/31/2008   SPONDYLOLISTHESIS 10/31/2008   SYNCOPE 10/31/2008   Sleep apnea 10/31/2008   Past Medical History:  Diagnosis Date   Allergy    Anemia    Anginal pain ([place])    last time    Anxiety    Arthritis    RHEUMATOID   Asthma    Bipolar 1 disorder ([place])    Blood transfusion without reported diagnosis    Cataracts, bilateral 07/2017   CHF (congestive heart failure) ([facility])    COPD (chronic obstructive pulmonary disease) ([place])    Coronary artery disease    reported hx of "MI";  Echo 2009 with normal LVF;  Myoview 05/2011: no ischemia   Depression    Diabetes mellitus without complication ([facility])    Dyslipidemia    Dysrhythmia    SVT   Esophageal stricture    Fibromyalgia    GERD (gastroesophageal reflux disease)    H/O hiatal hernia    Head injury, unspecified    Headache    migraines   Herpes simplex infection    History of kidney stones    History of loop recorder  Managed by Dr. Crissie Sickles   Hyperlipidemia    Hypertension    Insomnia    Myocardial infarction [facility])    age 67   Osteoporosis    Pneumonia    hx   Seizures ([place])    Shortness of breath    Sleep apnea    ? neg   Spinal stenosis of lumbar region    Spondylolisthesis    Status post placement of implantable loop recorder    Supraventricular tachycardia    Syncope and collapse    s/p ILR; no arhythmogenic cause identified   UTI (lower urinary tract infection)    Family History  Problem Relation Age of Onset   Heart attack Father    Mental illness Father    Mental illness Mother    Heart attack Brother        stents   Alcohol abuse Brother    Heart disease Brother    Drug abuse Brother    Diabetes Brother    Colon  cancer Maternal Aunt    Cirrhosis Brother    Stomach cancer Neg Hx    Esophageal cancer Neg Hx    Rectal cancer Neg Hx    Past Surgical History:  Procedure Laterality Date   BACK SURGERY     BREAST EXCISIONAL BIOPSY Right    1990s   BREAST SURGERY     lumpectomy   CARDIAC CATHETERIZATION  10/06/2011   CATARACT EXTRACTION W/PHACO Right 07/31/2017   Procedure: CATARACT EXTRACTION PHACO AND INTRAOCULAR LENS PLACEMENT (IOC);  Surgeon: [name], MD;  Location: [facility];  Service: Ophthalmology;  Laterality: Right;  CDE: 2.33   CATARACT EXTRACTION W/PHACO Left 08/14/2017   Procedure: CATARACT EXTRACTION PHACO AND INTRAOCULAR LENS PLACEMENT (IOC);  Surgeon: [name], MD;  Location: [facility];  Service: Ophthalmology;  Laterality: Left;  CDE: 2.74   CHOLECYSTECTOMY     CYSTOSCOPY     stone   DIAGNOSTIC LAPAROSCOPY     laparoscopic cholecystectomy   DOPPLER ECHOCARDIOGRAPHY  2009   ESOPHAGOGASTRODUODENOSCOPY N/A 10/31/2020   Procedure: ESOPHAGOGASTRODUODENOSCOPY (EGD);  Surgeon: Greer Pickerel, MD;  Location: [facility];  Service: General;  Laterality: N/A;   EYE SURGERY Bilateral 08/14/2017   cataract removal   head up tilt table testing  06/15/2007   [name]   HEMORRHOID SURGERY     HERNIA REPAIR     insertion of implatable loop recorder  08/11/2007   [name]   POSTERIOR CERVICAL FUSION/FORAMINOTOMY N/A 12/19/2013   Procedure: RIGHT C3-4.C4-5 AND C5-6 FORAMINOTOMIES;  Surgeon: [name], MD;  Location: [facility];  Service: Orthopedics;  Laterality: N/A;   TOTAL HIP ARTHROPLASTY Left 01/31/2020   Procedure: LEFT TOTAL HIP ARTHROPLASTY ANTERIOR APPROACH;  Surgeon: Mcarthur Rossetti, MD;  Location: [place];  Service: Orthopedics;  Laterality: Left;   TOTAL SHOULDER ARTHROPLASTY Right 11/15/2019   Procedure: RIGHT TOTAL SHOULDER ARTHROPLASTY;  Surgeon: Meredith Pel, MD;  Location: [facility];  Service: Orthopedics;  Laterality: Right;   TOTAL SHOULDER REVISION Right  06/11/2021   Procedure: RIGHT SHOULDER CONVERSION TOTAL SHOULDER ARTHROPLASTY to REVERSE TOTAL SHOULDER ARTHROPLASTY;  Surgeon: Meredith Pel, MD;  Location: [place];  Service: Orthopedics;  Laterality: Right;   TUBAL LIGATION     UPPER GASTROINTESTINAL ENDOSCOPY     XI ROBOTIC ASSISTED HIATAL HERNIA REPAIR N/A 10/31/2020   Procedure: XI ROBOTIC ASSISTED HIATAL HERNIA REPAIR WITH PARTIAL FUNDOPLICATION;  Surgeon: Greer Pickerel, MD;  Location: [facility];  Service: General;  Laterality: N/A;  Social History   Occupational History   Occupation: Disability    Comment: 15 years  Tobacco Use   Smoking status: Never   Smokeless tobacco: Never  Vaping Use   Vaping Use: Never used  Substance and Sexual Activity   Alcohol use: No    Alcohol/week: 0.0 standard drinks of alcohol   Drug use: No   Sexual activity: Yes

## 2022-07-03 NOTE — Progress Notes (Signed)
Pain returned about 3 weeks ago.  Sitting in the chair is painful.  She stated this last injection didn't last a long as the other in the past.  She is taking hydrocodone for pain along with pain patches, they are helping some but at this time she still isn't getting a good night sleep due to the pain.

## 2022-07-10 ENCOUNTER — Other Ambulatory Visit (INDEPENDENT_AMBULATORY_CARE_PROVIDER_SITE_OTHER): Payer: 59

## 2022-07-10 ENCOUNTER — Ambulatory Visit (INDEPENDENT_AMBULATORY_CARE_PROVIDER_SITE_OTHER): Payer: 59 | Admitting: Sports Medicine

## 2022-07-10 ENCOUNTER — Encounter: Payer: Self-pay | Admitting: Sports Medicine

## 2022-07-10 DIAGNOSIS — M1812 Unilateral primary osteoarthritis of first carpometacarpal joint, left hand: Secondary | ICD-10-CM | POA: Diagnosis not present

## 2022-07-10 DIAGNOSIS — M79645 Pain in left finger(s): Secondary | ICD-10-CM

## 2022-07-10 DIAGNOSIS — M25551 Pain in right hip: Secondary | ICD-10-CM | POA: Diagnosis not present

## 2022-07-10 DIAGNOSIS — M25552 Pain in left hip: Secondary | ICD-10-CM

## 2022-07-10 DIAGNOSIS — M79644 Pain in right finger(s): Secondary | ICD-10-CM

## 2022-07-10 DIAGNOSIS — M1811 Unilateral primary osteoarthritis of first carpometacarpal joint, right hand: Secondary | ICD-10-CM

## 2022-07-10 MED ORDER — CELECOXIB 200 MG PO CAPS: 200.0000 mg | ORAL_CAPSULE | Freq: Every day | ORAL | 0 refills | Status: DC

## 2022-07-10 MED ORDER — LIDOCAINE HCL 1 % IJ SOLN
0.5000 mL | INTRAMUSCULAR | Status: AC | PRN
Start: 2022-07-10 — End: 2022-07-10
  Administered 2022-07-10: .5 mL

## 2022-07-10 MED ORDER — LIDOCAINE HCL 1 % IJ SOLN
0.5000 mL | INTRAMUSCULAR | Status: AC | PRN
  Administered 2022-07-10: .5 mL

## 2022-07-10 MED ORDER — BETAMETHASONE SOD PHOS & ACET 6 (3-3) MG/ML IJ SUSP
6.0000 mg | INTRAMUSCULAR | Status: AC | PRN
Start: 2022-07-10 — End: 2022-07-10
  Administered 2022-07-10: 6 mg via INTRA_ARTICULAR

## 2022-07-10 NOTE — Progress Notes (Signed)
[name] - 67 y.o. female MRN [id number]  Date of birth: 07/14/1955  Office Visit Note: Visit Date: 07/10/2022 PCP: Bennie Pierini, FNP Referred by: Daphine Deutscher, [name], *  Subjective: Chief Complaint  Patient presents with   Left Hand - Pain   Right Hand - Pain   HPI: [name] is a pleasant 67 y.o. female who presents today for acute on chronic bilateral thumb pain.  She has had bilateral thumb pain for a year or so, however the last few months her pain has worsened.  Her left is more significant than the right.  She does note some swelling to the left side.  Denies any redness, no warmth or fever or chills.  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  She is no longer doing this.  When her pain is bad she does notice that she will drop items or have issues with gripping, left greater than right. She takes hydrocodone for pain.  Bilateral hips -her previous Toradol injections did give the right hip rather excellent relief, her left one is still bothering her.  She is interested in trying corticosteroid injection in the future when it has been at least 3 months.  Pertinent ROS were reviewed with the patient and found to be negative unless otherwise specified above in HPI.   Assessment & Plan: Visit Diagnoses:  1. Bilateral thumb pain   2. Primary osteoarthritis of first carpometacarpal joint of left hand   3. 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 Greater trochanteric pain syndrome of both lower extremities    Plan: Discussed with butterfly as she is having an exacerbation of her chronic underlying arthritic change at the [facility] joints of bilateral thumbs.  Her left is certainly worse than her right and her x-ray does reflect this.  We discussed all treatment options such as oral medication therapy, bracing, injection therapy, possible need for surgical intervention.  Through  shared decision-making, she elected to proceed with bilateral CMC joint injection with corticosteroid, tolerated well today with some pain.  We also fit her for bilateral CMC cool comfort braces which she found comfortable.  Would like her to ice for any postinjection pain.  Would like to calm down some of the inflammation as well and for pain control we will add Celebrex 200 mg to be taken once daily for the next 1-2 weeks, then as needed. She may continue her chronic hydrocodone-acetaminophen 7.5-325 mg 3 times a day for pain control.  She will follow-up with me in about 1 month to evaluate bilateral hips, may consider greater trochanteric injections at that time if needed.  Follow-up: Return in about 25 days (around 08/04/2022) for F/u in about 3.5-4 weeks for US-guided bilateral GT injections.   Meds & Orders:  Meds ordered this encounter  Medications   celecoxib (CELEBREX) 200 MG capsule    Sig: Take 1 capsule (200 mg total) by mouth daily.    Dispense:  30 capsule    Refill:  0    Orders Placed This Encounter  Procedures   Small Joint Inj   XR Hand Complete Right   XR Hand Complete Left     Procedures: Small Joint Inj: bilateral thumb CMC on 07/10/2022 11:35 AM Indications: pain Details: 27 G needle, radial approach Medications (Right): 0.5 mL lidocaine 1 %; 6 mg betamethasone acetate-betamethasone sodium phosphate 6 (3-3) MG/ML Medications (Left): 0.5 mL lidocaine 1 %;  6 mg betamethasone acetate-betamethasone sodium phosphate 6 (3-3) MG/ML Outcome: tolerated well, no immediate complications Procedure, treatment alternatives, risks and benefits explained, specific risks discussed. Consent was given by the patient. Immediately prior to procedure a time out was called to verify the correct patient, procedure, equipment, support staff and site/side marked as required. Patient was prepped and draped in the usual sterile fashion.          Clinical History: No specialty comments  available.  She reports that she has never smoked. She has never used smokeless tobacco.  Recent Labs    11/26/21 1213  HGBA1C 5.6    Objective:   Vital Signs: There were no vitals taken for this visit.  Physical Exam  Gen: Well-appearing, in no acute distress; non-toxic CV: Regular Rate. Well-perfused. Warm.  Resp: Breathing unlabored on room air; no wheezing. Psych: Fluid speech in conversation; appropriate affect; normal thought process Neuro: Sensation intact throughout. No gross coordination deficits.   Ortho Exam - Bilateral hands/thumbs: There is notable bony bossing of the left CMC joint.  There is positive TTP over bilateral CMC joints.  Positive CMC grind test bilaterally, of the left greater than right.  Okay sign intact.  Cap refill less than 2 seconds.  Negative Finkelstein's test bilaterally.  Imaging: XR Hand Complete Right  Result Date: 07/10/2022 4 views of the right hand including AP, lateral, oblique and Su Hilt view was ordered and reviewed by myself.  X-rays demonstrate mild-moderate osteoarthritic change at the [facility] joint about the thumb with some bony spurring over the radial aspect of the joint, the medial aspect is relatively well-preserved.  Negative ulnar variance.  XR Hand Complete Left  Result Date: 07/10/2022 4 views of the left hand including AP, lateral, oblique and Su Hilt view was ordered and reviewed by myself.  X-rays demonstrate severe osteoarthritic change at the [facility] joint about the thumb with bony sclerosis and spurring.  There is some mild to moderate STT arthritic change as well.  Negative ulnar variance.   Past Medical/Family/Surgical/Social History: Medications & Allergies reviewed per EMR, new medications updated. 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   Hypokalemia 11/25/2020   S/P laparoscopic fundoplication 10/31/2020    Status post total replacement of left hip 01/31/2020   Unilateral primary osteoarthritis, left hip 12/21/2019   S/P shoulder replacement, right 11/15/2019   Overweight (BMI 25.0-29.9) 04/15/2019   Diverticulosis 04/14/2019   Chronic diastolic CHF (congestive heart failure) 04/14/2019   Unstable angina 10/20/2017   Chronic pain 09/23/2016   Pre-diabetes 09/02/2016   Recurrent major depressive disorder, in partial remission 07/14/2016   Seizures 07/14/2016   GAD (generalized anxiety disorder) 07/14/2016   Insomnia 09/05/2014   Allergic rhinitis 09/05/2014   Cervical spondylosis without myelopathy 12/19/2013    Class: Chronic   Neural foraminal stenosis of cervical spine 12/19/2013   Cervical radiculitis 09/19/2013   Lumbosacral spondylosis without myelopathy 11/16/2012   Postlaminectomy syndrome, lumbar region 11/16/2012   Herpes simplex virus (HSV) infection 10/31/2008   Hyperlipidemia with target LDL less than 100 10/31/2008   Essential hypertension 10/31/2008   GERD 10/31/2008   SPINAL STENOSIS OF LUMBAR REGION 10/31/2008   Myalgia 10/31/2008   Osteoporosis 10/31/2008   SPONDYLOLISTHESIS 10/31/2008   SYNCOPE 10/31/2008   Sleep apnea 10/31/2008   Past Medical History:  Diagnosis Date   Allergy    Anemia    Anginal pain  last time    Anxiety    Arthritis    RHEUMATOID   Asthma    Bipolar 1 disorder    Blood transfusion without reported diagnosis    Cataracts, bilateral 07/2017   CHF (congestive heart failure)    COPD (chronic obstructive pulmonary disease)    Coronary artery disease    reported hx of "MI";  Echo 2009 with normal LVF;  Myoview 05/2011: no ischemia   Depression    Diabetes mellitus without complication    Dyslipidemia    Dysrhythmia    SVT   Esophageal stricture    Fibromyalgia    GERD (gastroesophageal reflux disease)    H/O hiatal hernia    Head injury, unspecified    Headache    migraines   Herpes simplex infection    History of kidney  stones    History of loop recorder    Managed by Dr. Sharrell Ku   Hyperlipidemia    Hypertension    Insomnia    Myocardial infarction    age 23   Osteoporosis    Pneumonia    hx   Seizures    Shortness of breath    Sleep apnea    ? neg   Spinal stenosis of lumbar region    Spondylolisthesis    Status post placement of implantable loop recorder    Supraventricular tachycardia    Syncope and collapse    s/p ILR; no arhythmogenic cause identified   UTI (lower urinary tract infection)    Family History  Problem Relation Age of Onset   Heart attack Father    Mental illness Father    Mental illness Mother    Heart attack Brother        stents   Alcohol abuse Brother    Heart disease Brother    Drug abuse Brother    Diabetes Brother    Colon cancer Maternal Aunt    Cirrhosis Brother    Stomach cancer Neg Hx    Esophageal cancer Neg Hx    Rectal cancer Neg Hx    Past Surgical History:  Procedure Laterality Date   BACK SURGERY     BREAST EXCISIONAL BIOPSY Right    1990s   BREAST SURGERY     lumpectomy   CARDIAC CATHETERIZATION  10/06/2011   CATARACT EXTRACTION W/PHACO Right 07/31/2017   Procedure: CATARACT EXTRACTION PHACO AND INTRAOCULAR LENS PLACEMENT (IOC);  Surgeon: Fabio Pierce, MD;  Location: [facility];  Service: Ophthalmology;  Laterality: Right;  CDE: 2.33   CATARACT EXTRACTION W/PHACO Left 08/14/2017   Procedure: CATARACT EXTRACTION PHACO AND INTRAOCULAR LENS PLACEMENT (IOC);  Surgeon: Fabio Pierce, MD;  Location: [facility];  Service: Ophthalmology;  Laterality: Left;  CDE: 2.74   CHOLECYSTECTOMY     CYSTOSCOPY     stone   DIAGNOSTIC LAPAROSCOPY     laparoscopic cholecystectomy   DOPPLER ECHOCARDIOGRAPHY  2009   ESOPHAGOGASTRODUODENOSCOPY N/A 10/31/2020   Procedure: ESOPHAGOGASTRODUODENOSCOPY (EGD);  Surgeon: Gaynelle Adu, MD;  Location: [facility];  Service: General;  Laterality: N/A;   EYE SURGERY Bilateral 08/14/2017   cataract removal   head up tilt table  testing  06/15/2007   [name]   HEMORRHOID SURGERY     HERNIA REPAIR     insertion of implatable loop recorder  08/11/2007   [name]   POSTERIOR CERVICAL FUSION/FORAMINOTOMY N/A 12/19/2013   Procedure: RIGHT C3-4.C4-5 AND C5-6 FORAMINOTOMIES;  Surgeon: Kerrin Champagne, MD;  Location: [facility] OR;  Service: Orthopedics;  Laterality: N/A;   TOTAL HIP ARTHROPLASTY Left 01/31/2020   Procedure: LEFT TOTAL HIP ARTHROPLASTY ANTERIOR APPROACH;  Surgeon: Kathryne HitchBlackman, Christopher Y, MD;  Location: [facility] OR;  Service: Orthopedics;  Laterality: Left;   TOTAL SHOULDER ARTHROPLASTY Right 11/15/2019   Procedure: RIGHT TOTAL SHOULDER ARTHROPLASTY;  Surgeon: Cammy Copaean, Gregory Scott, MD;  Location: [facility];  Service: Orthopedics;  Laterality: Right;   TOTAL SHOULDER REVISION Right 06/11/2021   Procedure: RIGHT SHOULDER CONVERSION TOTAL SHOULDER ARTHROPLASTY to REVERSE TOTAL SHOULDER ARTHROPLASTY;  Surgeon: Cammy Copaean, Gregory Scott, MD;  [facility];  Service: Orthopedics;  Laterality: Right;   TUBAL LIGATION     UPPER GASTROINTESTINAL ENDOSCOPY     XI ROBOTIC ASSISTED HIATAL HERNIA REPAIR N/A 10/31/2020   Procedure: XI ROBOTIC ASSISTED HIATAL HERNIA REPAIR WITH PARTIAL FUNDOPLICATION;  Surgeon: Gaynelle AduWilson, Eric, MD;  Location: [facility];  Service: General;  Laterality: N/A;   Social History   Occupational History   Occupation: Disability    Comment: 15 years  Tobacco Use   Smoking status: Never   Smokeless tobacco: Never  Vaping Use   Vaping Use: Never used  Substance and Sexual Activity   Alcohol use: No    Alcohol/week: 0.0 standard drinks of alcohol   Drug use: No   Sexual activity: Yes

## 2022-07-10 NOTE — Progress Notes (Signed)
Bilateral thumb pain 7-8 months of pain Minimal swelling in left Does not wear any type of braces for them but does have arthritis gloves that she will occasionally wear that helps some  Takes hydrocodone for pain

## 2022-07-26 ENCOUNTER — Other Ambulatory Visit: Payer: Self-pay | Admitting: Nurse Practitioner

## 2022-07-26 DIAGNOSIS — R7303 Prediabetes: Secondary | ICD-10-CM

## 2022-08-06 ENCOUNTER — Other Ambulatory Visit: Payer: Self-pay

## 2022-08-06 ENCOUNTER — Encounter: Payer: Self-pay | Admitting: Sports Medicine

## 2022-08-06 ENCOUNTER — Ambulatory Visit (INDEPENDENT_AMBULATORY_CARE_PROVIDER_SITE_OTHER): Payer: 59 | Admitting: Sports Medicine

## 2022-08-06 DIAGNOSIS — M1812 Unilateral primary osteoarthritis of first carpometacarpal joint, left hand: Secondary | ICD-10-CM | POA: Diagnosis not present

## 2022-08-06 DIAGNOSIS — M25551 Pain in right hip: Secondary | ICD-10-CM

## 2022-08-06 DIAGNOSIS — M1811 Unilateral primary osteoarthritis of first carpometacarpal joint, right hand: Secondary | ICD-10-CM | POA: Diagnosis not present

## 2022-08-06 DIAGNOSIS — M25552 Pain in left hip: Secondary | ICD-10-CM

## 2022-08-06 DIAGNOSIS — M7062 Trochanteric bursitis, left hip: Secondary | ICD-10-CM | POA: Diagnosis not present

## 2022-08-06 MED ORDER — CELECOXIB 200 MG PO CAPS: 200.0000 mg | ORAL_CAPSULE | Freq: Every day | ORAL | 0 refills | Status: DC

## 2022-08-06 MED ORDER — BUPIVACAINE HCL 0.25 % IJ SOLN
2.0000 mL | INTRAMUSCULAR | Status: AC | PRN
Start: 2022-08-06 — End: 2022-08-06
  Administered 2022-08-06: 2 mL via INTRA_ARTICULAR

## 2022-08-06 MED ORDER — CELECOXIB 200 MG PO CAPS: 200.0000 mg | ORAL_CAPSULE | Freq: Every day | ORAL | 1 refills | Status: DC

## 2022-08-06 MED ORDER — LIDOCAINE HCL 1 % IJ SOLN
2.0000 mL | INTRAMUSCULAR | Status: AC | PRN
Start: 2022-08-06 — End: 2022-08-06
  Administered 2022-08-06: 2 mL

## 2022-08-06 MED ORDER — BETAMETHASONE SOD PHOS & ACET 6 (3-3) MG/ML IJ SUSP
6.0000 mg | INTRAMUSCULAR | Status: AC | PRN
Start: 2022-08-06 — End: 2022-08-06
  Administered 2022-08-06: 6 mg via INTRA_ARTICULAR

## 2022-08-06 NOTE — Progress Notes (Signed)
[name] - 67 y.o. female MRN [id number]  Date of birth: Jun 11, 1955  Office Visit Note: Visit Date: 08/06/2022 PCP: Bennie Pierini, FNP Referred by: Daphine Deutscher, [name], *  Subjective: Chief Complaint  Patient presents with   Right Hip - Pain   Left Hip - Pain   HPI: [name] is a pleasant 67 y.o. female who presents today for bilateral lateral hip pain.  [name] presents for bilateral hip pain and follow-up of her BL thumb CMC osteoarthritis.  Her left hip continues to be the most problematic for her, difficult to lay on this at nighttime.  No specific injury.  She is doing well status post [facility] joint injections to the thumb.  After last visit we did start her on Celebrex 200 mg to be taken once daily, she notes this has significantly improved both her thumb/hand pain as well as her hip pain.  Tolerating medication well.  Pertinent ROS were reviewed with the patient and found to be negative unless otherwise specified above in HPI.   Assessment & Plan: Visit Diagnoses:  1. Greater trochanteric pain syndrome of both lower extremities   2. Trochanteric bursitis, left hip   3. 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 Primary osteoarthritis of first carpometacarpal joint of left hand    Plan: Discussed with butterflied options for her bilateral hip pain.  She had an MRI back on 01/27/2022 for her hip arthroplasty which showed no abnormal hardware deficits.  She did have some fluid overlying the gluteus medius and minimus tendons suspicious for tearing in this location upon my read.  She has gotten greater trochanteric injections in the past, this Xu decision-making we did proceed with ultrasound-guided greater trochanteric injections today.  She tolerated well.  I did have a discussion with her that if she is not getting significant relief from these injections and requiring use very frequently, we may  want to discuss other options or having her see my partner Dr. Steward Drone to discuss any surgical recommendations for gluteus numbness/medius repair.  She is understanding of this.  She has not had any formalized physical therapy.  We did provide her with a customized handout for hip strengthening and stabilization.  My athletic trainer Lequita Halt did review these in the room with her today.  She is to begin new starting on Saturday, perform once daily.  I would like to see what sort of pain relief and strength improvement she gets in about 6-7 weeks.  Did refill her Celebrex which has been helpful for both the hip pain and her thumb/hand arthritis. Continue Celebrex 200mg  qd.   Follow-up: Return in about 6 weeks (around 09/17/2022) for bilateral hips.   Meds & Orders:  Meds ordered this encounter  Medications   DISCONTD: celecoxib (CELEBREX) 200 MG capsule    Sig: Take 1 capsule (200 mg total) by mouth daily.    Dispense:  30 capsule    Refill:  0   celecoxib (CELEBREX) 200 MG capsule    Sig: Take 1 capsule (200 mg total) by mouth daily.    Dispense:  30 capsule    Refill:  1    Orders Placed This Encounter  Procedures   Large Joint Inj   Large Joint Inj   US Guided Needle Placement - No Linked Charges     Procedures: Large Joint Inj: R greater trochanter on 08/06/2022 9:40 AM Indications: pain Details: 22 G 3.5 in needle, ultrasound-guided lateral approach  Medications: 2 mL lidocaine 1 %; 2 mL bupivacaine 0.25 %; 6 mg betamethasone acetate-betamethasone sodium phosphate 6 (3-3) MG/ML Outcome: tolerated well, no immediate complications  US-Guided Greater Trochanteric Bursa Injection, Right After discussion on risks/benefits/indications and informed verbal consent was obtained, a timeout was performed. The patient was lying in lateral recumbent position on exam table. Using ultrasound guidance, the greater trochanter was identified. The area overlying the trochanteric bursa was then prepped with  Betadine and alcohol swabs. Following sterile precautions, ultrasound was reapplied to visualize needle guidance with a 22-gauge 3.5" needle utilizing an in-plane approach to inject the bursa with 2:2:1 lidocaine:bupivicaine:betamethasone. Delivery of the injectate was visualized into the region of hypoechoic fluid of the greater trochanteric bursa. Patient tolerated procedure well without immediate complications.    Procedure, treatment alternatives, risks and benefits explained, specific risks discussed. Consent was given by the patient. Immediately prior to procedure a time out was called to verify the correct patient, procedure, equipment, support staff and site/side marked as required. Patient was prepped and draped in the usual sterile fashion.    Large Joint Inj: L greater trochanter on 08/06/2022 9:41 AM Indications: pain Details: 22 G 3.5 in needle, ultrasound-guided lateral approach Medications: 2 mL lidocaine 1 %; 2 mL bupivacaine 0.25 %; 6 mg betamethasone acetate-betamethasone sodium phosphate 6 (3-3) MG/ML Outcome: tolerated well, no immediate complications  US-Guided Greater Trochanteric Bursa Injection, Left After discussion on risks/benefits/indications and informed verbal consent was obtained, a timeout was performed. The patient was lying in lateral recumbent position on exam table. Using ultrasound guidance, the greater trochanter was identified. The area overlying the trochanteric bursa was then prepped with Betadine and alcohol swabs. Following sterile precautions, ultrasound was reapplied to visualize needle guidance with a 22-gauge 3.5" needle utilizing an in-plane approach to inject the bursa with 2:2:1 lidocaine:bupivicaine:betamethasone. Delivery of the injectate was visualized into the region of hypoechoic fluid of the greater trochanteric bursa. Patient tolerated procedure well without immediate complications.    Procedure, treatment alternatives, risks and benefits  explained, specific risks discussed. Consent was given by the patient. Immediately prior to procedure a time out was called to verify the correct patient, procedure, equipment, support staff and site/side marked as required. Patient was prepped and draped in the usual sterile fashion.          Clinical History: No specialty comments available.  She reports that she has never smoked. She has never used smokeless tobacco.  Recent Labs    11/26/21 1213  HGBA1C 5.6    Objective:   Vital Signs: There were no vitals taken for this visit.  Physical Exam  Gen: Well-appearing, in no acute distress; non-toxic CV: Well-perfused. Warm.  Resp: Breathing unlabored on room air; no wheezing. Psych: Fluid speech in conversation; appropriate affect; normal thought process Neuro: Sensation intact throughout. No gross coordination deficits.   Ortho Exam - Bilateral hips: + TTP over bilateral greater trochanteric regions.  The left has a mild amount of swelling without redness or warmth.  There is weakness secondary to pain with resisted hip abduction bilaterally.  Left hip with a well-healed prior THA scar.  Imaging:  Narrative & Impression  CLINICAL DATA:  Left hip pain for 2 years   EXAM: MR OF THE LEFT HIP WITHOUT CONTRAST   TECHNIQUE: Multiplanar, multisequence MR imaging was performed. No intravenous contrast was administered.   COMPARISON:  Radiographs 04/15/2021 and CT pelvis 04/14/2019   FINDINGS: Bones: Left total hip prosthesis in place with expected resulting metal artifact.  There is also metal artifact from lower lumbar hardware. Accounting from the distortion and field originate a related to the metal artifact, no significant abnormal regional marrow edema is observed. Small bone islands in the left iliac bone and in the left anterior acetabular wall noted.   Articular cartilage and labrum   Articular cartilage:  N/A   Labrum:  N/A   Joint or bursal effusion   Joint  effusion:  Absent   Bursae: There is some trace fluid signal along the distal gluteus minimus tendon on the left, for example on images 12-13 of series 10, although no overt bursitis. No significant abnormal periarticular fluid collection identified.   Muscles and tendons   Muscles and tendons:  Unremarkable   Other findings   Miscellaneous: No left sciatic nerve impingement. Sigmoid colon diverticulosis.   IMPRESSION: 1. Left total hip prosthesis in place with expected resulting metal artifact. 2. There is some trace fluid signal along the distal gluteus minimus tendon on the left, although no overt bursitis is identified. 3. No significant abnormal periprosthetic fluid collection. 4. Sigmoid colon diverticulosis.     Electronically Signed   By: Gaylyn Rong M.D.   On: 02/03/2022 10:42    Past Medical/Family/Surgical/Social History: Medications & Allergies reviewed per EMR, new medications updated. 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   Hypokalemia 11/25/2020   S/P laparoscopic fundoplication 10/31/2020   Status post total replacement of left hip 01/31/2020   Unilateral primary osteoarthritis, left hip 12/21/2019   S/P shoulder replacement, right 11/15/2019   Overweight (BMI 25.0-29.9) 04/15/2019   Diverticulosis 04/14/2019   Chronic diastolic CHF (congestive heart failure) (HCC) 04/14/2019   Unstable angina (HCC) 10/20/2017   Chronic pain 09/23/2016   Pre-diabetes 09/02/2016   Recurrent major depressive disorder, in partial remission (HCC) 07/14/2016   Seizures (HCC) 07/14/2016   GAD (generalized anxiety disorder) 07/14/2016   Insomnia 09/05/2014   Allergic rhinitis 09/05/2014   Cervical spondylosis without myelopathy 12/19/2013    Class: Chronic   Neural foraminal stenosis of cervical spine 12/19/2013   Cervical radiculitis  09/19/2013   Lumbosacral spondylosis without myelopathy 11/16/2012   Postlaminectomy syndrome, lumbar region 11/16/2012   Herpes simplex virus (HSV) infection 10/31/2008   Hyperlipidemia with target LDL less than 100 10/31/2008   Essential hypertension 10/31/2008   GERD 10/31/2008   SPINAL STENOSIS OF LUMBAR REGION 10/31/2008   Myalgia 10/31/2008   Osteoporosis 10/31/2008   SPONDYLOLISTHESIS 10/31/2008   SYNCOPE 10/31/2008   Sleep apnea 10/31/2008   Past Medical History:  Diagnosis Date   Allergy    Anemia    Anginal pain (HCC)    last time    Anxiety    Arthritis    RHEUMATOID   Asthma    Bipolar 1 disorder (HCC)    Blood transfusion without reported diagnosis    Cataracts, bilateral 07/2017   CHF (congestive heart failure) (HCC)    COPD (chronic obstructive pulmonary disease) (HCC)    Coronary artery disease    reported hx of "MI";  Echo 2009 with normal LVF;  Myoview 05/2011: no ischemia   Depression    Diabetes mellitus without complication (HCC)    Dyslipidemia    Dysrhythmia    SVT   Esophageal stricture    Fibromyalgia    GERD (gastroesophageal reflux disease)    H/O hiatal hernia    Head injury, unspecified  Headache    migraines   Herpes simplex infection    History of kidney stones    History of loop recorder    Managed by Dr. Sharrell Ku   Hyperlipidemia    Hypertension    Insomnia    Myocardial infarction [facility])    age 32   Osteoporosis    Pneumonia    hx   Seizures (HCC)    Shortness of breath    Sleep apnea    ? neg   Spinal stenosis of lumbar region    Spondylolisthesis    Status post placement of implantable loop recorder    Supraventricular tachycardia    Syncope and collapse    s/p ILR; no arhythmogenic cause identified   UTI (lower urinary tract infection)    Family History  Problem Relation Age of Onset   Heart attack Father    Mental illness Father    Mental illness Mother    Heart attack Brother        stents   Alcohol  abuse Brother    Heart disease Brother    Drug abuse Brother    Diabetes Brother    Colon cancer Maternal Aunt    Cirrhosis Brother    Stomach cancer Neg Hx    Esophageal cancer Neg Hx    Rectal cancer Neg Hx    Past Surgical History:  Procedure Laterality Date   BACK SURGERY     BREAST EXCISIONAL BIOPSY Right    1990s   BREAST SURGERY     lumpectomy   CARDIAC CATHETERIZATION  10/06/2011   CATARACT EXTRACTION W/PHACO Right 07/31/2017   Procedure: CATARACT EXTRACTION PHACO AND INTRAOCULAR LENS PLACEMENT (IOC);  Surgeon: Fabio Pierce, MD;  Location: [facility];  Service: Ophthalmology;  Laterality: Right;  CDE: 2.33   CATARACT EXTRACTION W/PHACO Left 08/14/2017   Procedure: CATARACT EXTRACTION PHACO AND INTRAOCULAR LENS PLACEMENT (IOC);  Surgeon: Fabio Pierce, MD;  Location: [facility];  Service: Ophthalmology;  Laterality: Left;  CDE: 2.74   CHOLECYSTECTOMY     CYSTOSCOPY     stone   DIAGNOSTIC LAPAROSCOPY     laparoscopic cholecystectomy   DOPPLER ECHOCARDIOGRAPHY  2009   ESOPHAGOGASTRODUODENOSCOPY N/A 10/31/2020   Procedure: ESOPHAGOGASTRODUODENOSCOPY (EGD);  Surgeon: Gaynelle Adu, MD;  Location: [facility];  Service: General;  Laterality: N/A;   EYE SURGERY Bilateral 08/14/2017   cataract removal   head up tilt table testing  06/15/2007   [name]   HEMORRHOID SURGERY     HERNIA REPAIR     insertion of implatable loop recorder  08/11/2007   [name]   POSTERIOR CERVICAL FUSION/FORAMINOTOMY N/A 12/19/2013   Procedure: RIGHT C3-4.C4-5 AND C5-6 FORAMINOTOMIES;  Surgeon: Kerrin Champagne, MD;  Location: [facility] OR;  Service: Orthopedics;  Laterality: N/A;   TOTAL HIP ARTHROPLASTY Left 01/31/2020   Procedure: LEFT TOTAL HIP ARTHROPLASTY ANTERIOR APPROACH;  Surgeon: Kathryne Hitch, MD;  Location: [facility] OR;  Service: Orthopedics;  Laterality: Left;   TOTAL SHOULDER ARTHROPLASTY Right 11/15/2019   Procedure: RIGHT TOTAL SHOULDER ARTHROPLASTY;  Surgeon: Cammy Copa, MD;   Location: [facility];  Service: Orthopedics;  Laterality: Right;   TOTAL SHOULDER REVISION Right 06/11/2021   Procedure: RIGHT SHOULDER CONVERSION TOTAL SHOULDER ARTHROPLASTY to REVERSE TOTAL SHOULDER ARTHROPLASTY;  Surgeon: Cammy Copa, MD;  Location: [facility] OR;  Service: Orthopedics;  Laterality: Right;   TUBAL LIGATION     UPPER GASTROINTESTINAL ENDOSCOPY     XI ROBOTIC ASSISTED HIATAL HERNIA REPAIR N/A 10/31/2020  Procedure: XI ROBOTIC ASSISTED HIATAL HERNIA REPAIR WITH PARTIAL FUNDOPLICATION;  Surgeon: Gaynelle Adu, MD;  Location: [facility];  Service: General;  Laterality: N/A;   Social History   Occupational History   Occupation: Disability    Comment: 15 years  Tobacco Use   Smoking status: Never   Smokeless tobacco: Never  Vaping Use   Vaping Use: Never used  Substance and Sexual Activity   Alcohol use: No    Alcohol/week: 0.0 standard drinks of alcohol   Drug use: No   Sexual activity: Yes

## 2022-08-16 ENCOUNTER — Other Ambulatory Visit: Payer: Self-pay

## 2022-08-16 ENCOUNTER — Emergency Department (HOSPITAL_COMMUNITY): Payer: 59

## 2022-08-16 ENCOUNTER — Emergency Department (HOSPITAL_COMMUNITY)
Admission: EM | Admit: 2022-08-16 | Discharge: 2022-08-16 | Disposition: A | Discharge status: Home / Self Care | Payer: 59 | Attending: Emergency Medicine | Admitting: Emergency Medicine

## 2022-08-16 DIAGNOSIS — E119 Type 2 diabetes mellitus without complications: Secondary | ICD-10-CM | POA: Insufficient documentation

## 2022-08-16 DIAGNOSIS — Z7982 Long term (current) use of aspirin: Secondary | ICD-10-CM | POA: Insufficient documentation

## 2022-08-16 DIAGNOSIS — D72829 Elevated white blood cell count, unspecified: Secondary | ICD-10-CM | POA: Insufficient documentation

## 2022-08-16 DIAGNOSIS — J449 Chronic obstructive pulmonary disease, unspecified: Secondary | ICD-10-CM | POA: Insufficient documentation

## 2022-08-16 DIAGNOSIS — K5732 Diverticulitis of large intestine without perforation or abscess without bleeding: Secondary | ICD-10-CM | POA: Diagnosis not present

## 2022-08-16 DIAGNOSIS — R1032 Left lower quadrant pain: Secondary | ICD-10-CM | POA: Diagnosis present

## 2022-08-16 DIAGNOSIS — I2510 Atherosclerotic heart disease of native coronary artery without angina pectoris: Secondary | ICD-10-CM | POA: Diagnosis not present

## 2022-08-16 DIAGNOSIS — I509 Heart failure, unspecified: Secondary | ICD-10-CM | POA: Insufficient documentation

## 2022-08-16 LAB — COMPREHENSIVE METABOLIC PANEL
ALT: 30 U/L (ref 0–44)
AST: 21 U/L (ref 15–41)
Albumin: 3.7 g/dL (ref 3.5–5.0)
Alkaline Phosphatase: 81 U/L (ref 38–126)
Anion gap: 8 (ref 5–15)
BUN: 13 mg/dL (ref 8–23)
CO2: 25 mmol/L (ref 22–32)
Calcium: 9 mg/dL (ref 8.9–10.3)
Chloride: 102 mmol/L (ref 98–111)
Creatinine, Ser: 0.71 mg/dL (ref 0.44–1.00)
GFR, Estimated: >60 mL/min (ref >60)
Glucose, Bld: 119 mg/dL — ABNORMAL HIGH (ref 70–99)
Potassium: 3.7 mmol/L (ref 3.5–5.1)
Sodium: 135 mmol/L (ref 135–145)
Total Bilirubin: 0.9 mg/dL (ref 0.3–1.2)
Total Protein: 6.8 g/dL (ref 6.5–8.1)

## 2022-08-16 LAB — URINALYSIS, W/ REFLEX TO CULTURE (INFECTION SUSPECTED)
Bilirubin Urine: NEGATIVE
Glucose, UA: >=500 mg/dL — AB
Hgb urine dipstick: NEGATIVE
Ketones, ur: NEGATIVE mg/dL
Leukocytes,Ua: NEGATIVE
Nitrite: NEGATIVE
Protein, ur: NEGATIVE mg/dL
Specific Gravity, Urine: 1.02 (ref 1.005–1.030)
pH: 6 (ref 5.0–8.0)

## 2022-08-16 LAB — CBC
HCT: 46.2 % — ABNORMAL HIGH (ref 36.0–46.0)
Hemoglobin: 15.3 g/dL — ABNORMAL HIGH (ref 12.0–15.0)
MCH: 32.2 pg (ref 26.0–34.0)
MCHC: 33.1 g/dL (ref 30.0–36.0)
MCV: 97.3 fL (ref 80.0–100.0)
Platelets: 168 10*3/uL (ref 150–400)
RBC: 4.75 MIL/uL (ref 3.87–5.11)
RDW: 11.8 % (ref 11.5–15.5)
WBC: 12.7 10*3/uL — ABNORMAL HIGH (ref 4.0–10.5)
nRBC: 0 % (ref 0.0–0.2)

## 2022-08-16 LAB — LIPASE, BLOOD: Lipase: 26 U/L (ref 11–51)

## 2022-08-16 MED ORDER — KETOROLAC TROMETHAMINE 30 MG/ML IJ SOLN
30.0000 mg | Freq: Once | INTRAMUSCULAR | Status: AC
  Administered 2022-08-16: 30 mg via INTRAVENOUS
  Filled 2022-08-16: qty 1

## 2022-08-16 MED ORDER — SODIUM CHLORIDE 0.9 % IV BOLUS
1000.0000 mL | Freq: Once | INTRAVENOUS | Status: AC
  Administered 2022-08-16: 1000 mL via INTRAVENOUS

## 2022-08-16 MED ORDER — AMOXICILLIN-POT CLAVULANATE 875-125 MG PO TABS
1.0000 | ORAL_TABLET | Freq: Once | ORAL | Status: AC
  Administered 2022-08-16: 1 via ORAL
  Filled 2022-08-16: qty 1

## 2022-08-16 MED ORDER — AMOXICILLIN-POT CLAVULANATE 875-125 MG PO TABS: 1.0000 | ORAL_TABLET | Freq: Two times a day (BID) | ORAL | 0 refills | Status: DC

## 2022-08-16 MED ORDER — ONDANSETRON HCL 4 MG/2ML IJ SOLN
4.0000 mg | Freq: Once | INTRAMUSCULAR | Status: AC
  Administered 2022-08-16: 4 mg via INTRAVENOUS
  Filled 2022-08-16: qty 2

## 2022-08-16 NOTE — ED Provider Notes (Addendum)
[place] [facility] AT [facility] Provider Note   CSN: [id number] Arrival date & time: 08/16/22  4098     History  Chief Complaint  Patient presents with   Flank Pain   HPI [name] is a 67 y.o. female CAD, COPD, diabetes and CHF presenting for flank pain.  Started 3 days ago.  Located in the left flank and radiates to the left groin.  Feels like a sharp and stabbing pain.  Endorses malodorous urine but no other urinary symptoms.  Denies fever or chills.  Endorses nausea but no vomiting diarrhea.   Flank Pain       Home Medications Prior to Admission medications   Medication Sig Start Date End Date Taking? Authorizing Provider  amoxicillin-clavulanate (AUGMENTIN) 875-125 MG tablet Take 1 tablet by mouth every 12 (twelve) hours. 08/16/22  Yes Gareth Eagle, PA-C  Accu-Chek Softclix Lancets lancets TEST BLOOD SUGAR UP TO 4 TIMES A DAY AS DIRECTED. R73.03 03/28/22   Daphine Deutscher, [name], FNP  acyclovir ointment (ZOVIRAX) 5 % APPLY TOPICALLY EVERY 3 HOURS. 06/26/22   Daphine Deutscher, [name], FNP  albuterol (VENTOLIN HFA) 108 (90 Base) MCG/ACT inhaler INHALE 2 PUFFS EVERY 6 HOURS AS NEEDED FOR SHORTNESS OF BREATH AND WHEEZING. 03/28/22   Daphine Deutscher [name], FNP  Alcohol Swabs (B-D SINGLE USE SWABS REGULAR) PADS USE 1 PAD DAILY WHEN CHECKING BLOOD SUGAR. R73.03 06/26/22   Daphine Deutscher, [name], FNP  aspirin EC 81 MG EC tablet Take 1 tablet (81 mg total) by mouth daily. Swallow whole. 06/12/21   Magnant, Charles L, PA-C  blood glucose meter kit and supplies Dispense based on patient and insurance preference. Use up to four times daily as directed. (FOR ICD-10 E10.9, E11.9). 02/04/21   Daphine Deutscher, [name], FNP  busPIRone (BUSPAR) 10 MG tablet Take 1 tablet (10 mg total) by mouth 2 (two) times daily as needed. 06/26/22   Bennie Pierini, FNP  butalbital-acetaminophen-caffeine (FIORICET) [phone] MG tablet Take 1 tablet by mouth every 6 (six) hours as  needed for headache. 06/26/22   Daphine Deutscher, [name], FNP  carvedilol (COREG) 3.125 MG tablet TAKE 1/2 (0.5) TABLET TWICE A DAY WITH A MEAL; BLOOD PRESSURE OVER 150 BEFORE TAKING. 06/26/22   Daphine Deutscher, [name], FNP  celecoxib (CELEBREX) 200 MG capsule Take 1 capsule (200 mg total) by mouth daily. 08/06/22   Madelyn Brunner, DO  Deutetrabenazine (AUSTEDO) 6 MG TABS Take 1 tablet by mouth 2 (two) times daily. 03/21/22   Daphine Deutscher [name], FNP  dexlansoprazole (DEXILANT) 60 MG capsule Take 1 capsule (60 mg total) by mouth daily. 06/26/22   Daphine Deutscher, [name], FNP  diclofenac Sodium (VOLTAREN) 1 % GEL APPLY 4 GRAMS TOPICALLY 4 TIMES A DAY. Patient taking differently: Apply 4 g topically 4 (four) times daily as needed (pain). 03/01/21   Daphine Deutscher [name], FNP  DULoxetine (CYMBALTA) 60 MG capsule Take 1 capsule (60 mg total) by mouth at bedtime. 06/26/22   Daphine Deutscher, [name], FNP  empagliflozin (JARDIANCE) 25 MG TABS tablet take 1 tablet (25 MILLIGRAM total) by mouth daily before breakfast. 07/28/22   Daphine Deutscher, [name], FNP  escitalopram (LEXAPRO) 20 MG tablet Take 1 tablet (20 mg total) by mouth daily. 06/26/22   Daphine Deutscher, [name], FNP  estradiol (ESTRACE) 0.1 MG/GM vaginal cream Place 1 Applicatorful vaginally 3 (three) times a week. 06/27/22   Daphine Deutscher, [name], FNP  FIBER PO Take 1 tablet by mouth in the morning and at bedtime.    [provider]  fludrocortisone (FLORINEF) 0.1 MG tablet Take 1 tablet (100 mcg  total) by mouth every other day. 12/03/21   Daphine Deutscher, [name], FNP  fluticasone (FLONASE) 50 MCG/ACT nasal spray USE 1 SPRAY IN EACH NOSTRIL ONCE DAILY. Patient taking differently: Place 1 spray into both nostrils daily as needed for allergies. 04/04/20   Daphine Deutscher, [name], FNP  fluticasone furoate-vilanterol (BREO ELLIPTA) 100-25 MCG/ACT AEPB Inhale 1 puff into the lungs daily. 04/09/21   Daphine Deutscher, [name], FNP  furosemide (LASIX) 40 MG tablet TAKE 1 TABLET DAILY  AS NEEDED FOR EXCESSIVE FLUID. 06/26/22   Daphine Deutscher, [name], FNP  Glucagon, rDNA, (GLUCAGON EMERGENCY) 1 MG KIT Inject 1 mg into the skin as needed. 06/26/22   Daphine Deutscher, [name], FNP  glucose blood (ACCU-CHEK GUIDE) test strip TEST BLOOD SUGAR UP TO 4 TIMES A DAY AS DIRECTED. R73.03 02/28/22   Daphine Deutscher, [name], FNP  hydrALAZINE (APRESOLINE) 25 MG tablet TAKE 1 TABLET BY MOUTH 3 TIMES DAILY AS NEEDED (FOR SEVERE HYPERTENSION/SYSTOLIC NUMBER 170 OR GREATER). 04/28/22   Antoine Poche, MD  HYDROcodone-acetaminophen (NORCO) 7.5-325 MG tablet Take 1 tablet by mouth in the morning, at noon, and at bedtime. 08/25/22 09/24/22  Bennie Pierini, FNP  HYDROcodone-acetaminophen (NORCO) 7.5-325 MG tablet Take 1 tablet by mouth in the morning, at noon, and at bedtime. 07/26/22 08/25/22  Daphine Deutscher, [name], FNP  ipratropium (ATROVENT) 0.02 % nebulizer solution USE 1 VIAL ( ) IN NEBULIZER EVERY 6 HOURS AS NEEDED FOR WHEEZING OR SHORTNESS OF BREATH. 06/28/20   Daphine Deutscher, [name], FNP  isosorbide mononitrate (IMDUR) 30 MG 24 hr tablet Take 1.5 tablets (45 mg total) by mouth daily. 06/26/22   Daphine Deutscher [name], FNP  lamoTRIgine (LAMICTAL) 150 MG tablet Take 1 tablet (150 mg total) by mouth daily. 06/26/22   Daphine Deutscher, [name], FNP  levETIRAcetam (KEPPRA) 500 MG tablet Take 1 tablet (500 mg total) by mouth 2 (two) times daily. 06/26/22   Daphine Deutscher, [name], FNP  levocetirizine (XYZAL) 5 MG tablet TAKE 1 TABLET BY MOUTH EVERY MORNING. 01/29/22   Daphine Deutscher, [name], FNP  lidocaine (LIDODERM) 5 % Place 1 patch onto the skin daily. Remove & Discard patch within 12 hours or as directed by MD 06/26/22   Daphine Deutscher, [name], FNP  lidocaine (XYLOCAINE) 5 % ointment APPLY TO AFFECTED AREA 3 TIMES A DAY AS NEEDED FOR MILD OR MODERATE PAIN. 06/26/22   Daphine Deutscher, [name], FNP  methocarbamol (ROBAXIN) 500 MG tablet Take 1 tablet (500 mg total) by mouth every 8 (eight) hours as needed for muscle spasms.  06/12/21   Magnant, Joycie Peek, PA-C  methocarbamol (ROBAXIN) 750 MG tablet Take 1 tablet (750 mg total) by mouth every 12 (twelve) hours as needed for muscle spasms. 08/02/21   Cammy Copa, MD  nitroGLYCERIN (NITROSTAT) 0.4 MG SL tablet PLACE ONE (1) TABLET UNDER TONGUE EVERY 5 MINUTES UP TO (3) DOSES AS NEEDED FOR CHEST PAIN. 04/28/22   Daphine Deutscher, [name], FNP  ondansetron (ZOFRAN ODT) 4 MG disintegrating tablet Take 1 tablet (4 mg total) by mouth every 8 (eight) hours as needed for nausea or vomiting. 11/27/20   Sherryll Burger, Pratik D, DO  ondansetron (ZOFRAN) 4 MG tablet TAKE 1 TABLET BY MOUTH EVERY 8 HOURS AS NEEDED FOR NAUSEA AND VOMITING. 05/29/22   Daphine Deutscher, [name], FNP  oxybutynin (DITROPAN-XL) 5 MG 24 hr tablet Take 1 tablet (5 mg total) by mouth at bedtime. 11/26/21   Daphine Deutscher [name], FNP  Probiotic Product (PROBIOTIC PO) Take 1 capsule by mouth daily.    [provider]  rosuvastatin (CRESTOR) 10 MG tablet Take 1 tablet (10 mg total) by mouth daily. 06/26/22  Daphine Deutscher, [name], FNP  tetrabenazine Guinevere Scarlet) 12.5 MG tablet Take 1 tablet (12.5 mg total) by mouth daily. 12/31/21   Daphine Deutscher [name], FNP  traZODone (DESYREL) 150 MG tablet Take 1 tablet (150 mg total) by mouth at bedtime. 06/26/22   Daphine Deutscher, [name], FNP  valACYclovir (VALTREX) 1000 MG tablet Take 1 tablet (1,000 mg total) by mouth 3 (three) times daily. 06/26/22   Daphine Deutscher, [name], FNP  valACYclovir (VALTREX) 500 MG tablet TAKE (1) TABLET TWICE DAILY. 11/26/21   Daphine Deutscher [name], FNP      Allergies    Codeine, Morphine and codeine, Ambien [zolpidem tartrate], Clonidine derivatives, Metformin and related, Lyrica [pregabalin], and Neurontin [gabapentin]    Review of Systems   Review of Systems  Genitourinary:  Positive for flank pain.    Physical Exam   Vitals:   08/16/22 0909 08/16/22 0911  BP:    Pulse:  89  Resp: 18   Temp: 99.1 F (37.3 C)   SpO2:  92%    CONSTITUTIONAL:   well-appearing, NAD NEURO:  Alert and oriented x 3, CN 3-12 grossly intact EYES:  eyes equal and reactive ENT/NECK:  Supple, no stridor  CARDIO:  Regular rate and rhythm, appears well-perfused  PULM:  No respiratory distress, CTAB GI/GU:  non-distended, soft, LLQ tenderness MSK/SPINE:  No gross deformities, no edema, moves all extremities  SKIN:  no rash, atraumatic  *Additional and/or pertinent findings included in MDM below  ED Results / Procedures / Treatments   Labs (all labs ordered are listed, but only abnormal results are displayed) Labs Reviewed  CBC - Abnormal; Notable for the following components:      Result Value   WBC 12.7 (*)    Hemoglobin 15.3 (*)    HCT 46.2 (*)    All other components within normal limits  URINALYSIS, W/ REFLEX TO CULTURE (INFECTION SUSPECTED) - Abnormal; Notable for the following components:   Glucose, UA >=500 (*)    Bacteria, UA RARE (*)    All other components within normal limits  COMPREHENSIVE METABOLIC PANEL - Abnormal; Notable for the following components:   Glucose, Bld 119 (*)    All other components within normal limits  LIPASE, BLOOD    EKG None  Radiology CT RENAL STONE STUDY  Result Date: 08/16/2022 CLINICAL DATA:  Abdominal/flank pain, stone suspected EXAM: CT ABDOMEN AND PELVIS WITHOUT CONTRAST TECHNIQUE: Multidetector CT imaging of the abdomen and pelvis was performed following the standard protocol without IV contrast. RADIATION DOSE REDUCTION: This exam was performed according to the departmental dose-optimization program which includes automated exposure control, adjustment of the mA and/or kV according to patient size and/or use of iterative reconstruction technique. COMPARISON:  04/14/2019 and previous FINDINGS: Lower chest: No pleural or pericardial effusion. Chronic scarring/atelectasis in the inferior lingula, and in both lung bases. Hepatobiliary: No focal liver abnormality is seen. Status post cholecystectomy. No  biliary dilatation. Pancreas: Unremarkable. No pancreatic ductal dilatation or surrounding inflammatory changes. Spleen: Normal in size without focal abnormality. Adrenals/Urinary Tract: Adrenal glands are unremarkable. Kidneys are normal, without renal calculi, focal lesion, or hydronephrosis. Bladder is nondistended. Stomach/Bowel: Small hiatal hernia. Stomach is incompletely distended, otherwise unremarkable. The small bowel is nondilated. Normal appendix. The colon is partially distended by gas and fecal material. Scattered distal descending and sigmoid diverticula. Long segment of wall thickening in the proximal sigmoid colon with regional inflammatory/edematous changes suggesting diverticulitis. No drainable abscess. Vascular/Lymphatic: Mild scattered calcified aortic atheromatous plaque without aneurysm. No abdominal or pelvic adenopathy. Reproductive: Uterus and  bilateral adnexa are unremarkable. Other: Multiple bilateral pelvic phleboliths. No ascites. No free air. Musculoskeletal: Stable-appearing instrumented PLIF L4-5. Chronic T12 vertebral compression deformity. Left hip arthroplasty. No acute findings. IMPRESSION: 1. Proximal sigmoid diverticulitis without abscess. Consider endoscopic follow-up to exclude underlying mucosal lesion. 2. Small hiatal hernia. 3. Chronic T12 vertebral compression deformity. 4.  Aortic Atherosclerosis (ICD10-I70.0). Electronically Signed   By: Corlis Leak M.D.   On: 08/16/2022 09:55    Procedures Procedures    Medications Ordered in ED Medications  amoxicillin-clavulanate (AUGMENTIN) 875-125 MG per tablet 1 tablet (has no administration in time range)  sodium chloride 0.9 % bolus 1,000 mL (1,000 mLs Intravenous New Bag/Given 08/16/22 1002)  ondansetron (ZOFRAN) injection 4 mg (4 mg Intravenous Given 08/16/22 1004)  ketorolac (TORADOL) 30 MG/ML injection 30 mg (30 mg Intravenous Given 08/16/22 1005)    ED Course/ Medical Decision Making/ A&P                              Medical Decision Making Amount and/or Complexity of Data Reviewed Labs: ordered. Radiology: ordered.  Risk Prescription drug management.   Initial Impression and Ddx 67 year old well-appearing female presenting for flank pain.  Exam notable for left lower quadrant tenderness.  DDx includes diverticulitis, appendicitis, nephrolithiasis, pyelonephritis, other intra-abdominal infection. Patient PMH that increases complexity of ED encounter:  CAD, COPD, diabetes and CHF  Interpretation of Diagnostics I independent reviewed and interpreted the labs as followed: Mild leukocytosis  - I independently visualized the following imaging with scope of interpretation limited to determining acute life threatening conditions related to emergency care: CT, which revealed sigmoid diverticulitis without abscess  Patient Reassessment and Ultimate Disposition/Management After treatment, patient stated that pain had improved significantly.  CT revealed concern for sigmoid diverticulitis.  Started on Augmentin.  Given how clinically well she appeared in patient states she felt much better after pain treatment with Toradol, discussed with her that she is appropriate to be discharged home on antibiotics and follow-up PCP.  Also advised a couple days of clear liquid diet and then to advance diet as tolerated. Patient was agreeable to this plan.  Vitals remained stable throughout encounter.  Discussed pertinent return precautions.  Discharged home.  Patient management required discussion with the following services or consulting groups:  None  Complexity of Problems Addressed Acute complicated illness or Injury  Additional Data Reviewed and Analyzed Further history obtained from: Past medical history and medications listed in the EMR and Prior ED visit notes  Patient Encounter Risk Assessment Prescriptions         Final Clinical Impression(s) / ED Diagnoses Final diagnoses:  Sigmoid  diverticulitis    Rx / DC Orders ED Discharge Orders          Ordered    amoxicillin-clavulanate (AUGMENTIN) 875-125 MG tablet  Every 12 hours        08/16/22 1104              Gareth Eagle, PA-C 08/16/22 1105    Gareth Eagle, PA-C 08/16/22 1105    Terrilee Files, MD 08/16/22 1740

## 2022-08-16 NOTE — Discharge Instructions (Signed)
Evaluation for your flank pain revealed that you have sigmoid diverticulitis.  Treatment is Augmentin which is an antibiotic.  You can also treat your pain with Tylenol and ibuprofen.  Recommend you follow-up with PCP next week for reevaluation.  If you have new rectal bleeding, worsening abdominal pain, fever or chills, nausea and vomiting or any other concerning symptom please return emerged part for further evaluation.

## 2022-08-16 NOTE — ED Triage Notes (Signed)
Pt c/o left flank pain x 3 days. Pt stated her urine has a strong smell and c/o some nausea yesterday.

## 2022-08-18 ENCOUNTER — Telehealth: Payer: Self-pay

## 2022-08-18 NOTE — Telephone Encounter (Signed)
Transition Care Management Unsuccessful Follow-up Telephone Call  Date of discharge and from where:  08/16/22 [name]  Attempts:  1st Attempt  Reason for unsuccessful TCM follow-up call:  Left voice message

## 2022-08-18 NOTE — Telephone Encounter (Signed)
Pt returned missed call. Says she was prescribed antibiotics while at the ER, and the medication is helping, so she doesn't want to make an ER follow up appt at this time. If that changes, she will call the office back.

## 2022-08-26 ENCOUNTER — Other Ambulatory Visit: Payer: Self-pay | Admitting: Nurse Practitioner

## 2022-08-26 DIAGNOSIS — Q762 Congenital spondylolisthesis: Secondary | ICD-10-CM

## 2022-09-17 ENCOUNTER — Encounter: Payer: Self-pay | Admitting: Sports Medicine

## 2022-09-17 ENCOUNTER — Ambulatory Visit (INDEPENDENT_AMBULATORY_CARE_PROVIDER_SITE_OTHER): Payer: 59 | Admitting: Sports Medicine

## 2022-09-17 DIAGNOSIS — M1812 Unilateral primary osteoarthritis of first carpometacarpal joint, left hand: Secondary | ICD-10-CM

## 2022-09-17 DIAGNOSIS — M67952 Unspecified disorder of synovium and tendon, left thigh: Secondary | ICD-10-CM | POA: Diagnosis not present

## 2022-09-17 DIAGNOSIS — M25552 Pain in left hip: Secondary | ICD-10-CM

## 2022-09-17 DIAGNOSIS — S76012D Strain of muscle, fascia and tendon of left hip, subsequent encounter: Secondary | ICD-10-CM

## 2022-09-17 DIAGNOSIS — M25551 Pain in right hip: Secondary | ICD-10-CM

## 2022-09-17 DIAGNOSIS — Z96642 Presence of left artificial hip joint: Secondary | ICD-10-CM

## 2022-09-17 NOTE — Progress Notes (Addendum)
SHAILEEN [name] - 67 y.o. female MRN [id number]  Date of birth: Jun 26, 1955  Office Visit Note: Visit Date: 09/17/2022 PCP: Bennie Pierini, FNP Referred by: [name], [name], *  Subjective: Chief Complaint  Patient presents with   Right Hip - Follow-up   Left Hip - Follow-up   HPI: [name] is a pleasant 67 y.o. female who presents today for bilateral lateral hip pain, L > R.  Did receive bilateral greater trochanteric injections under ultrasound by myself on 08/06/2022.  The right hip is still doing well but the left hip is quite painful.  The left hip has always been her worst side and the most recent injection only gave her about 3, maybe 4 weeks of pain relief.  She has been trying home exercises, but the left hip is quite painful and notably weak.  She is taking Celebrex 200 mg once daily which does help with some pain relief but the left side is inhibiting her quality of life.  Left CMC joint -this is starting to begin hurting her again as well, does feel better in the [facility] cool comfort brace.  Inquiring about a repeat injection in the future.  Celebrex does help.  Pertinent ROS were reviewed with the patient and found to be negative unless otherwise specified above in HPI.   Assessment & Plan: Visit Diagnoses:  1. Tear of left gluteus minimus tendon, subsequent encounter   2. Tendinopathy of left gluteus medius   3. History of left hip replacement   4. Greater trochanteric pain syndrome of both lower extremities   5. Primary osteoarthritis of first carpometacarpal joint of left hand    Plan: Discussed with [name] that for her left lateral hip, I did review her previous MRI which shows undersurface tearing of at least the gluteus minimus as well as tendinopathy and may be partial tear of the gluteus medius tendons.  For many years she has received greater trochanteric injections, the right is still doing fine but the left only gives her very short-term  relief.  Discussed that would not be wise to keep repeating this frequently to wear off the tendons, but I do think it is pertinent to get her to see my partner Dr. Steward Drone to discuss possible surgical repair or see what other options she has. As an FYI, she is s/p L-hip THA by Dr. Magnus Ivan 2-3 years ago. She is agreeable and understanding to this.  She will continue her Celebrex 200 mg once daily, I would like her to continue her home hip stabilization exercises for each hip.  In terms of her left CMC joint, she will continue her cool comfort brace and we may consider repeat joint injection in the near future.  Follow-up: Return for make Appt with Dr. Steward Drone to discuss gluteus med/min repair Shon Baton).   Meds & Orders: No orders of the defined types were placed in this encounter.  No orders of the defined types were placed in this encounter.    Procedures: No procedures performed      Clinical History: No specialty comments available.  She reports that she has never smoked. She has never used smokeless tobacco.  Recent Labs    11/26/21 1213  HGBA1C 5.6    Objective:   Vital Signs: There were no vitals taken for this visit.  Physical Exam  Gen: Well-appearing, in no acute distress; non-toxic CV:  Well-perfused. Warm.  Resp: Breathing unlabored on room air; no wheezing. Psych: Fluid speech in conversation; appropriate affect;  normal thought process Neuro: Sensation intact throughout. No gross coordination deficits.   Ortho Exam - Bilateral hips: Rather significant TTP over the left greater trochanter, right greater trochanter without much pain.  Well-healed incision over the left anterior hip from prior hip replacement.  There is definite weakness of the left greater than right hip with resisted hip abduction.  Left hip abduction 3/5 strength, right 4/5 strength.   - Left thumb: Cool comfort CMC brace in place, no swelling, grating with range of motion.  Imaging: Narrative &  Impression  CLINICAL DATA:  Left hip pain for 2 years   EXAM: MR OF THE LEFT HIP WITHOUT CONTRAST   TECHNIQUE: Multiplanar, multisequence MR imaging was performed. No intravenous contrast was administered.   COMPARISON:  Radiographs 04/15/2021 and CT pelvis 04/14/2019   FINDINGS: Bones: Left total hip prosthesis in place with expected resulting metal artifact. There is also metal artifact from lower lumbar hardware. Accounting from the distortion and field originate a related to the metal artifact, no significant abnormal regional marrow edema is observed. Small bone islands in the left iliac bone and in the left anterior acetabular wall noted.   Articular cartilage and labrum   Articular cartilage:  N/A   Labrum:  N/A   Joint or bursal effusion   Joint effusion:  Absent   Bursae: There is some trace fluid signal along the distal gluteus minimus tendon on the left, for example on images 12-13 of series 10, although no overt bursitis. No significant abnormal periarticular fluid collection identified.   Muscles and tendons   Muscles and tendons:  Unremarkable   Other findings   Miscellaneous: No left sciatic nerve impingement. Sigmoid colon diverticulosis.   IMPRESSION: 1. Left total hip prosthesis in place with expected resulting metal artifact. 2. There is some trace fluid signal along the distal gluteus minimus tendon on the left, although no overt bursitis is identified. 3. No significant abnormal periprosthetic fluid collection. 4. Sigmoid colon diverticulosis.     Electronically Signed   By: [name] M.D.   On: 02/03/2022 10:42    Past Medical/Family/Surgical/Social History: Medications & Allergies reviewed per EMR, new medications updated. 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   Hypokalemia 11/25/2020   S/P  laparoscopic fundoplication 10/31/2020   Status post total replacement of left hip 01/31/2020   Unilateral primary osteoarthritis, left hip 12/21/2019   S/P shoulder replacement, right 11/15/2019   Overweight (BMI 25.0-29.9) 04/15/2019   Diverticulosis 04/14/2019   Chronic diastolic CHF (congestive heart failure) (HCC) 04/14/2019   Unstable angina (HCC) 10/20/2017   Chronic pain 09/23/2016   Pre-diabetes 09/02/2016   Recurrent major depressive disorder, in partial remission (HCC) 07/14/2016   Seizures (HCC) 07/14/2016   GAD (generalized anxiety disorder) 07/14/2016   Insomnia 09/05/2014   Allergic rhinitis 09/05/2014   Cervical spondylosis without myelopathy 12/19/2013    Class: Chronic   Neural foraminal stenosis of cervical spine 12/19/2013   Cervical radiculitis 09/19/2013   Lumbosacral spondylosis without myelopathy 11/16/2012   Postlaminectomy syndrome, lumbar region 11/16/2012   Herpes simplex virus (HSV) infection 10/31/2008   Hyperlipidemia with target LDL less than 100 10/31/2008   Essential hypertension 10/31/2008   GERD 10/31/2008   SPINAL STENOSIS OF LUMBAR REGION 10/31/2008   Myalgia 10/31/2008   Osteoporosis 10/31/2008   SPONDYLOLISTHESIS 10/31/2008   SYNCOPE 10/31/2008   Sleep apnea 10/31/2008  Past Medical History:  Diagnosis Date   Allergy    Anemia    Anginal pain (HCC)    last time    Anxiety    Arthritis    RHEUMATOID   Asthma    Bipolar 1 disorder (HCC)    Blood transfusion without reported diagnosis    Cataracts, bilateral 07/2017   CHF (congestive heart failure) (HCC)    COPD (chronic obstructive pulmonary disease) (HCC)    Coronary artery disease    reported hx of "MI";  Echo 2009 with normal LVF;  Myoview 05/2011: no ischemia   Depression    Diabetes mellitus without complication (HCC)    Dyslipidemia    Dysrhythmia    SVT   Esophageal stricture    Fibromyalgia    GERD (gastroesophageal reflux disease)    H/O hiatal hernia    Head  injury, unspecified    Headache    migraines   Herpes simplex infection    History of kidney stones    History of loop recorder    Managed by Dr. Sharrell Ku   Hyperlipidemia    Hypertension    Insomnia    Myocardial infarction [facility])    age 64   Osteoporosis    Pneumonia    hx   Seizures (HCC)    Shortness of breath    Sleep apnea    ? neg   Spinal stenosis of lumbar region    Spondylolisthesis    Status post placement of implantable loop recorder    Supraventricular tachycardia    Syncope and collapse    s/p ILR; no arhythmogenic cause identified   UTI (lower urinary tract infection)    Family History  Problem Relation Age of Onset   Heart attack Father    Mental illness Father    Mental illness Mother    Heart attack Brother        stents   Alcohol abuse Brother    Heart disease Brother    Drug abuse Brother    Diabetes Brother    Colon cancer Maternal Aunt    Cirrhosis Brother    Stomach cancer Neg Hx    Esophageal cancer Neg Hx    Rectal cancer Neg Hx    Past Surgical History:  Procedure Laterality Date   BACK SURGERY     BREAST EXCISIONAL BIOPSY Right    1990s   BREAST SURGERY     lumpectomy   CARDIAC CATHETERIZATION  10/06/2011   CATARACT EXTRACTION W/PHACO Right 07/31/2017   Procedure: CATARACT EXTRACTION PHACO AND INTRAOCULAR LENS PLACEMENT (IOC);  Surgeon: Fabio Pierce, MD;  Location: [facility];  Service: Ophthalmology;  Laterality: Right;  CDE: 2.33   CATARACT EXTRACTION W/PHACO Left 08/14/2017   Procedure: CATARACT EXTRACTION PHACO AND INTRAOCULAR LENS PLACEMENT (IOC);  Surgeon: Fabio Pierce, MD;  Location: [facility];  Service: Ophthalmology;  Laterality: Left;  CDE: 2.74   CHOLECYSTECTOMY     CYSTOSCOPY     stone   DIAGNOSTIC LAPAROSCOPY     laparoscopic cholecystectomy   DOPPLER ECHOCARDIOGRAPHY  2009   ESOPHAGOGASTRODUODENOSCOPY N/A 10/31/2020   Procedure: ESOPHAGOGASTRODUODENOSCOPY (EGD);  Surgeon: Gaynelle Adu, MD;  Location: [facility];   Service: General;  Laterality: N/A;   EYE SURGERY Bilateral 08/14/2017   cataract removal   head up tilt table testing  06/15/2007   [name]   HEMORRHOID SURGERY     HERNIA REPAIR     insertion of implatable loop recorder  08/11/2007   [name]  POSTERIOR CERVICAL FUSION/FORAMINOTOMY N/A 12/19/2013   Procedure: RIGHT C3-4.C4-5 AND C5-6 FORAMINOTOMIES;  Surgeon: Kerrin Champagne, MD;  Location: [facility] OR;  Service: Orthopedics;  Laterality: N/A;   TOTAL HIP ARTHROPLASTY Left 01/31/2020   Procedure: LEFT TOTAL HIP ARTHROPLASTY ANTERIOR APPROACH;  Surgeon: Kathryne Hitch, MD;  Location: [facility];  Service: Orthopedics;  Laterality: Left;   TOTAL SHOULDER ARTHROPLASTY Right 11/15/2019   Procedure: RIGHT TOTAL SHOULDER ARTHROPLASTY;  Surgeon: Cammy Copa, MD;  Location: [facility];  Service: Orthopedics;  Laterality: Right;   TOTAL SHOULDER REVISION Right 06/11/2021   Procedure: RIGHT SHOULDER CONVERSION TOTAL SHOULDER ARTHROPLASTY to REVERSE TOTAL SHOULDER ARTHROPLASTY;  Surgeon: Cammy Copa, MD;  Location: [facility] OR;  Service: Orthopedics;  Laterality: Right;   TUBAL LIGATION     UPPER GASTROINTESTINAL ENDOSCOPY     XI ROBOTIC ASSISTED HIATAL HERNIA REPAIR N/A 10/31/2020   Procedure: XI ROBOTIC ASSISTED HIATAL HERNIA REPAIR WITH PARTIAL FUNDOPLICATION;  Surgeon: Gaynelle Adu, MD;  Location: [facility];  Service: General;  Laterality: N/A;   Social History   Occupational History   Occupation: Disability    Comment: 15 years  Tobacco Use   Smoking status: Never   Smokeless tobacco: Never  Vaping Use   Vaping Use: Never used  Substance and Sexual Activity   Alcohol use: No    Alcohol/week: 0.0 standard drinks of alcohol   Drug use: No   Sexual activity: Yes

## 2022-09-17 NOTE — Progress Notes (Signed)
Right hip is doing good Left hip is killing her today; painful to sit She is doing HEP and taking the medication as prescribed

## 2022-09-18 ENCOUNTER — Ambulatory Visit (HOSPITAL_BASED_OUTPATIENT_CLINIC_OR_DEPARTMENT_OTHER): Payer: Self-pay | Admitting: Orthopaedic Surgery

## 2022-09-18 ENCOUNTER — Other Ambulatory Visit (HOSPITAL_BASED_OUTPATIENT_CLINIC_OR_DEPARTMENT_OTHER): Payer: Self-pay

## 2022-09-18 ENCOUNTER — Ambulatory Visit (INDEPENDENT_AMBULATORY_CARE_PROVIDER_SITE_OTHER): Payer: 59 | Admitting: Orthopaedic Surgery

## 2022-09-18 ENCOUNTER — Encounter (HOSPITAL_BASED_OUTPATIENT_CLINIC_OR_DEPARTMENT_OTHER): Payer: Self-pay | Admitting: Orthopaedic Surgery

## 2022-09-18 ENCOUNTER — Telehealth: Payer: Self-pay | Admitting: Cardiology

## 2022-09-18 DIAGNOSIS — S76012D Strain of muscle, fascia and tendon of left hip, subsequent encounter: Secondary | ICD-10-CM

## 2022-09-18 MED ORDER — ACETAMINOPHEN 500 MG PO TABS
500.0000 mg | ORAL_TABLET | Freq: Three times a day (TID) | ORAL | 0 refills | Status: DC
  Filled 2022-09-18: qty 30, 10d supply, fill #0

## 2022-09-18 MED ORDER — ASPIRIN 325 MG PO TBEC
325.0000 mg | DELAYED_RELEASE_TABLET | Freq: Every day | ORAL | 0 refills | Status: DC
  Filled 2022-09-18 – 2022-10-08 (×2): qty 30, 30d supply, fill #0

## 2022-09-18 MED ORDER — OXYCODONE HCL 5 MG PO TABS
5.0000 mg | ORAL_TABLET | ORAL | 0 refills | Status: DC | PRN
  Filled 2022-09-18 – 2022-10-08 (×2): qty 30, 5d supply, fill #0

## 2022-09-18 NOTE — Telephone Encounter (Signed)
   Name: [name]  DOB: 1955/08/01  MRN: [id number]  Primary Cardiologist: Dina Rich, MD   Preoperative team, please contact this patient and set up a phone call appointment for further preoperative risk assessment. Please obtain consent and complete medication review. Thank you for your help.  I confirm that guidance regarding antiplatelet and oral anticoagulation therapy has been completed and, if necessary, noted below.  None requested.   Ronney Asters, NP 09/18/2022, 4:23 PM [facility]

## 2022-09-18 NOTE — Telephone Encounter (Signed)
Pt has appt with Dr. Wyline Mood 09/22/22. I have added need pre op clearance to appt notes. Will update all parties involved.

## 2022-09-18 NOTE — Telephone Encounter (Signed)
   Pre-operative Risk Assessment    Patient Name: [name]  DOB: [birth year] MRN: [id number]      Request for Surgical Clearance    Procedure:   Left Gluteus ,edius repair   Date of Surgery:  Clearance TBD                                 Surgeon:  Huel Cote Surgeon's Group or Practice Name:  [facility]  Phone number:  [phone] Fax number:  [phone]   Type of Clearance Requested:   - Medical    Type of Anesthesia:  General    Additional requests/questions:    Lajuana Matte   09/18/2022, 4:11 PM

## 2022-09-18 NOTE — Progress Notes (Signed)
Chief Complaint: Left hip pain     History of Present Illness:    [name] is a 67 y.o. female presents with left lateral trochanteric pain for the last 3 years.  She has previously undergone a left total hip arthroplasty by Dr. Magnus Ivan in November 2021.  States that she did very well for 1 year postop and subsequently has developed lateral based hip pain.  She has previously undergone physical therapy with little relief.  She is having a very difficult time putting weight on the side.  She does have worsening pain with any type of significant walking.  She has previously undergone a left trochanteric based injection with Dr. Shon Baton which she says gave her 1 month very good relief.  She is on long-term hydrocodone as result of previous back surgery.  She is also on disability from this as well.    Surgical History:   None  PMH/PSH/Family History/Social History/Meds/Allergies:    Past Medical History:  Diagnosis Date   Allergy    Anemia    Anginal pain (HCC)    last time    Anxiety    Arthritis    RHEUMATOID   Asthma    Bipolar 1 disorder (HCC)    Blood transfusion without reported diagnosis    Cataracts, bilateral 07/2017   CHF (congestive heart failure) (HCC)    COPD (chronic obstructive pulmonary disease) (HCC)    Coronary artery disease    reported hx of "MI";  Echo 2009 with normal LVF;  Myoview 05/2011: no ischemia   Depression    Diabetes mellitus without complication (HCC)    Dyslipidemia    Dysrhythmia    SVT   Esophageal stricture    Fibromyalgia    GERD (gastroesophageal reflux disease)    H/O hiatal hernia    Head injury, unspecified    Headache    migraines   Herpes simplex infection    History of kidney stones    History of loop recorder    Managed by Dr. Sharrell Ku   Hyperlipidemia    Hypertension    Insomnia    Myocardial infarction [facility])    age 38   Osteoporosis    Pneumonia    hx   Seizures (HCC)     Shortness of breath    Sleep apnea    ? neg   Spinal stenosis of lumbar region    Spondylolisthesis    Status post placement of implantable loop recorder    Supraventricular tachycardia    Syncope and collapse    s/p ILR; no arhythmogenic cause identified   UTI (lower urinary tract infection)    Past Surgical History:  Procedure Laterality Date   BACK SURGERY     BREAST EXCISIONAL BIOPSY Right    1990s   BREAST SURGERY     lumpectomy   CARDIAC CATHETERIZATION  10/06/2011   CATARACT EXTRACTION W/PHACO Right 07/31/2017   Procedure: CATARACT EXTRACTION PHACO AND INTRAOCULAR LENS PLACEMENT (IOC);  Surgeon: Fabio Pierce, MD;  Location: [facility];  Service: Ophthalmology;  Laterality: Right;  CDE: 2.33   CATARACT EXTRACTION W/PHACO Left 08/14/2017   Procedure: CATARACT EXTRACTION PHACO AND INTRAOCULAR LENS PLACEMENT (IOC);  Surgeon: Fabio Pierce, MD;  Location: [facility];  Service: Ophthalmology;  Laterality: Left;  CDE: 2.74   CHOLECYSTECTOMY  CYSTOSCOPY     stone   DIAGNOSTIC LAPAROSCOPY     laparoscopic cholecystectomy   DOPPLER ECHOCARDIOGRAPHY  2009   ESOPHAGOGASTRODUODENOSCOPY N/A 10/31/2020   Procedure: ESOPHAGOGASTRODUODENOSCOPY (EGD);  Surgeon: Gaynelle Adu, MD;  Location: [facility];  Service: General;  Laterality: N/A;   EYE SURGERY Bilateral 08/14/2017   cataract removal   head up tilt table testing  06/15/2007   [name]   HEMORRHOID SURGERY     HERNIA REPAIR     insertion of implatable loop recorder  08/11/2007   [name]   POSTERIOR CERVICAL FUSION/FORAMINOTOMY N/A 12/19/2013   Procedure: RIGHT C3-4.C4-5 AND C5-6 FORAMINOTOMIES;  Surgeon: Kerrin Champagne, MD;  Location: [facility] OR;  Service: Orthopedics;  Laterality: N/A;   TOTAL HIP ARTHROPLASTY Left 01/31/2020   Procedure: LEFT TOTAL HIP ARTHROPLASTY ANTERIOR APPROACH;  Surgeon: Kathryne Hitch, MD;  Location: [facility] OR;  Service: Orthopedics;  Laterality: Left;   TOTAL SHOULDER ARTHROPLASTY Right 11/15/2019    Procedure: RIGHT TOTAL SHOULDER ARTHROPLASTY;  Surgeon: Cammy Copa, MD;  Location: [facility];  Service: Orthopedics;  Laterality: Right;   TOTAL SHOULDER REVISION Right 06/11/2021   Procedure: RIGHT SHOULDER CONVERSION TOTAL SHOULDER ARTHROPLASTY to REVERSE TOTAL SHOULDER ARTHROPLASTY;  Surgeon: Cammy Copa, MD;  Location: [facility] OR;  Service: Orthopedics;  Laterality: Right;   TUBAL LIGATION     UPPER GASTROINTESTINAL ENDOSCOPY     XI ROBOTIC ASSISTED HIATAL HERNIA REPAIR N/A 10/31/2020   Procedure: XI ROBOTIC ASSISTED HIATAL HERNIA REPAIR WITH PARTIAL FUNDOPLICATION;  Surgeon: Gaynelle Adu, MD;  Location: [facility];  Service: General;  Laterality: N/A;   Social History   Socioeconomic History   Marital status: Single    Spouse name: Not on file   Number of children: 5   Years of education: Not on file   Highest education level: 5th grade  Occupational History   Occupation: Disability    Comment: 15 years  Tobacco Use   Smoking status: Never   Smokeless tobacco: Never  Vaping Use   Vaping Use: Never used  Substance and Sexual Activity   Alcohol use: No    Alcohol/week: 0.0 standard drinks of alcohol   Drug use: No   Sexual activity: Yes  Other Topics Concern   Not on file  Social History Narrative   Patient lives alone - 2 granddaughters live nearby - children are very involved   Patient is on disability.    Patient has 5th grade education.    Patient has 5 children, 24 grand-chilren, and 5 great-grand children.    Right handed    Social Determinants of Health   Financial Resource Strain: Low Risk  (09/09/2021)   Overall Financial Resource Strain (CARDIA)    Difficulty of Paying Living Expenses: Not hard at all  Food Insecurity: No Food Insecurity (09/09/2021)   Hunger Vital Sign    Worried About Running Out of Food in the Last Year: Never true    Ran Out of Food in the Last Year: Never true  Transportation Needs: No Transportation Needs (09/09/2021)   PRAPARE  - Administrator, Civil Service (Medical): No    Lack of Transportation (Non-Medical): No  Physical Activity: Inactive (09/09/2021)   Exercise Vital Sign    Days of Exercise per Week: 0 days    Minutes of Exercise per Session: 0 min  Stress: No Stress Concern Present (09/09/2021)   [employer] of Occupational Health - Occupational Stress Questionnaire    Feeling of Stress :  Not at all  Social Connections: Moderately Integrated (09/09/2021)   Social Connection and Isolation Panel [NHANES]    Frequency of Communication with Friends and Family: More than three times a week    Frequency of Social Gatherings with Friends and Family: More than three times a week    Attends Religious Services: More than 4 times per year    Active Member of [employer] or Organizations: Yes    Attends Engineer, structural: More than 4 times per year    Marital Status: Never married   Family History  Problem Relation Age of Onset   Heart attack Father    Mental illness Father    Mental illness Mother    Heart attack Brother        stents   Alcohol abuse Brother    Heart disease Brother    Drug abuse Brother    Diabetes Brother    Colon cancer Maternal Aunt    Cirrhosis Brother    Stomach cancer Neg Hx    Esophageal cancer Neg Hx    Rectal cancer Neg Hx    Allergies  Allergen Reactions   Codeine Other (See Comments)    "I will have a heart attack."   Morphine And Codeine Other (See Comments)    "It will cause me to have a heart attack."   Ambien [Zolpidem Tartrate] Nausea And Vomiting   Clonidine Derivatives Nausea And Vomiting    gerd - caused acid reflux per pt   Metformin And Related Nausea And Vomiting    cramping from metformin   Lyrica [Pregabalin] Swelling and Other (See Comments)    Weight gain   Neurontin [Gabapentin] Other (See Comments)    Causes elevated LFTs    Current Outpatient Medications  Medication Sig Dispense Refill   acetaminophen (TYLENOL) 500 MG  tablet Take 1 tablet (500 mg total) by mouth every 8 (eight) hours for 10 days. 30 tablet 0   aspirin EC 325 MG tablet Take 1 tablet (325 mg total) by mouth daily. 30 tablet 0   oxyCODONE (ROXICODONE) 5 MG immediate release tablet Take 1 tablet (5 mg total) by mouth every 4 (four) hours as needed for severe pain or breakthrough pain. 30 tablet 0   Accu-Chek Softclix Lancets lancets TEST BLOOD SUGAR UP TO 4 TIMES A DAY AS DIRECTED. R73.03 400 each 3   acyclovir ointment (ZOVIRAX) 5 % APPLY TOPICALLY EVERY 3 HOURS. 30 g 1   albuterol (VENTOLIN HFA) 108 (90 Base) MCG/ACT inhaler INHALE 2 PUFFS EVERY 6 HOURS AS NEEDED FOR SHORTNESS OF BREATH AND WHEEZING. 6.7 g 2   Alcohol Swabs (B-D SINGLE USE SWABS REGULAR) PADS USE 1 PAD DAILY WHEN CHECKING BLOOD SUGAR. R73.03 100 each 3   amoxicillin-clavulanate (AUGMENTIN) 875-125 MG tablet Take 1 tablet by mouth every 12 (twelve) hours. 14 tablet 0   aspirin EC 81 MG EC tablet Take 1 tablet (81 mg total) by mouth daily. Swallow whole. 30 tablet 0   blood glucose meter kit and supplies Dispense based on patient and insurance preference. Use up to four times daily as directed. (FOR ICD-10 E10.9, E11.9). 1 each 0   busPIRone (BUSPAR) 10 MG tablet Take 1 tablet (10 mg total) by mouth 2 (two) times daily as needed. 60 tablet 5   butalbital-acetaminophen-caffeine (FIORICET) 50-325-40 MG tablet Take 1 tablet by mouth every 6 (six) hours as needed for headache. 20 tablet 2   carvedilol (COREG) 3.125 MG tablet TAKE 1/2 (0.5) TABLET TWICE  A DAY WITH A MEAL; BLOOD PRESSURE OVER 150 BEFORE TAKING. 90 tablet 1   celecoxib (CELEBREX) 200 MG capsule Take 1 capsule (200 mg total) by mouth daily. 30 capsule 1   Deutetrabenazine (AUSTEDO) 6 MG TABS Take 1 tablet by mouth 2 (two) times daily. 60 tablet 2   dexlansoprazole (DEXILANT) 60 MG capsule Take 1 capsule (60 mg total) by mouth daily. 90 capsule 1   diclofenac Sodium (VOLTAREN) 1 % GEL APPLY 4 GRAMS TOPICALLY 4 TIMES A DAY.  (Patient taking differently: Apply 4 g topically 4 (four) times daily as needed (pain).) 100 g 0   DULoxetine (CYMBALTA) 60 MG capsule Take 1 capsule (60 mg total) by mouth at bedtime. 90 capsule 1   empagliflozin (JARDIANCE) 25 MG TABS tablet take 1 tablet (25 MILLIGRAM total) by mouth daily before breakfast. 30 tablet 1   escitalopram (LEXAPRO) 20 MG tablet Take 1 tablet (20 mg total) by mouth daily. 90 tablet 1   estradiol (ESTRACE) 0.1 MG/GM vaginal cream Place 1 Applicatorful vaginally 3 (three) times a week. 42.5 g 3   FIBER PO Take 1 tablet by mouth in the morning and at bedtime.     fludrocortisone (FLORINEF) 0.1 MG tablet TAKE 1 TABLET ( TOTAL) BY MOUTH EVERY OTHER DAY. 15 tablet 0   fluticasone (FLONASE) 50 MCG/ACT nasal spray USE 1 SPRAY IN EACH NOSTRIL ONCE DAILY. (Patient taking differently: Place 1 spray into both nostrils daily as needed for allergies.) 16 g 5   fluticasone furoate-vilanterol (BREO ELLIPTA) 100-25 MCG/ACT AEPB Inhale 1 puff into the lungs daily. 1 each 11   furosemide (LASIX) 40 MG tablet TAKE 1 TABLET DAILY AS NEEDED FOR EXCESSIVE FLUID. 30 tablet 1   Glucagon, rDNA, (GLUCAGON EMERGENCY) 1 MG KIT Inject 1 mg into the skin as needed. 1 kit 5   glucose blood (ACCU-CHEK GUIDE) test strip TEST BLOOD SUGAR UP TO 4 TIMES A DAY AS DIRECTED. R73.03 400 strip 3   hydrALAZINE (APRESOLINE) 25 MG tablet TAKE 1 TABLET BY MOUTH 3 TIMES DAILY AS NEEDED (FOR SEVERE HYPERTENSION/SYSTOLIC NUMBER 170 OR GREATER). 90 tablet 1   HYDROcodone-acetaminophen (NORCO) 7.5-325 MG tablet take 1 tablet by mouth in the morning, at noon, and at bedtime. 90 tablet 0   ipratropium (ATROVENT) 0.02 % nebulizer solution USE 1 VIAL ( ) IN NEBULIZER EVERY 6 HOURS AS NEEDED FOR WHEEZING OR SHORTNESS OF BREATH. 150 mL 2   isosorbide mononitrate (IMDUR) 30 MG 24 hr tablet Take 1.5 tablets (45 mg total) by mouth daily. 45 tablet 6   lamoTRIgine (LAMICTAL) 150 MG tablet Take 1 tablet (150 mg total) by  mouth daily. 30 tablet 1   levETIRAcetam (KEPPRA) 500 MG tablet Take 1 tablet (500 mg total) by mouth 2 (two) times daily. 180 tablet 1   levocetirizine (XYZAL) 5 MG tablet TAKE 1 TABLET BY MOUTH EVERY MORNING. 30 tablet 2   lidocaine (LIDODERM) 5 % Place 1 patch onto the skin daily. Remove & Discard patch within 12 hours or as directed by MD 30 patch 3   lidocaine (XYLOCAINE) 5 % ointment APPLY TO AFFECTED AREA 3 TIMES A DAY AS NEEDED FOR MILD OR MODERATE PAIN. 35.44 g 1   methocarbamol (ROBAXIN) 500 MG tablet Take 1 tablet (500 mg total) by mouth every 8 (eight) hours as needed for muscle spasms. 30 tablet 0   methocarbamol (ROBAXIN) 750 MG tablet Take 1 tablet (750 mg total) by mouth every 12 (twelve) hours as needed for muscle spasms.  30 tablet 0   nitroGLYCERIN (NITROSTAT) 0.4 MG SL tablet PLACE ONE (1) TABLET UNDER TONGUE EVERY 5 MINUTES UP TO (3) DOSES AS NEEDED FOR CHEST PAIN. 25 tablet 0   ondansetron (ZOFRAN ODT) 4 MG disintegrating tablet Take 1 tablet (4 mg total) by mouth every 8 (eight) hours as needed for nausea or vomiting. 20 tablet 0   ondansetron (ZOFRAN) 4 MG tablet TAKE 1 TABLET BY MOUTH EVERY 8 HOURS AS NEEDED FOR NAUSEA AND VOMITING. 20 tablet 0   oxybutynin (DITROPAN-XL) 5 MG 24 hr tablet Take 1 tablet (5 mg total) by mouth at bedtime. 90 tablet 1   Probiotic Product (PROBIOTIC PO) Take 1 capsule by mouth daily.     rosuvastatin (CRESTOR) 10 MG tablet Take 1 tablet (10 mg total) by mouth daily. 90 tablet 1   tetrabenazine (XENAZINE) 12.5 MG tablet Take 1 tablet (12.5 mg total) by mouth daily. 30 tablet 2   traZODone (DESYREL) 150 MG tablet Take 1 tablet (150 mg total) by mouth at bedtime. 90 tablet 1   valACYclovir (VALTREX) 1000 MG tablet Take 1 tablet (1,000 mg total) by mouth 3 (three) times daily. 21 tablet 2   valACYclovir (VALTREX) 500 MG tablet TAKE (1) TABLET TWICE DAILY. 180 tablet 1   No current facility-administered medications for this visit.   No results  found.  Review of Systems:   A ROS was performed including pertinent positives and negatives as documented in the HPI.  Physical Exam :   Constitutional: NAD and appears stated age Neurological: Alert and oriented Psych: Appropriate affect and cooperative There were no vitals taken for this visit.   Comprehensive Musculoskeletal Exam:    Inspection Right Left  Skin No atrophy or gross abnormalities appreciated No atrophy or gross abnormalities appreciated  Palpation    Tenderness None Lateral trochanter  Crepitus None None  Range of Motion    Flexion (passive) 120 120  Extension 30 30  IR 30 30  ER 45 45 with pain  Strength    Flexion  5/5 5/5  Extension 5/5 5/5  Special Tests    FABER Negative Positive  FADIR Negative Negative  ER Lag/Capsular Insufficiency Negative Negative  Instability Negative Negative  Sacroiliac pain Negative  Negative   Instability    Generalized Laxity No No  Neurologic    sciatic, femoral, obturator nerves intact to light sensation  Vascular/Lymphatic    DP pulse 2+ 2+  Lumbar Exam    Patient has symmetric lumbar range of motion with negative pain referral to hip     Imaging:   Xray ([facility] pelvis, 2 views left hip): Status post total hip arthroplasty without evidence of complication  MRI (left hip): There is significant metal artifact although there does appear to be an undersurface tear of gluteus medius with full-thickness tearing of the gluteus minimus  I personally reviewed and interpreted the radiographs.   Assessment:   67 y.o. female with left hip pain consistent with gluteus minimus and medius tearing.  Overall this time she has tried physical therapy as well as an injection which did give her quite significant relief for 1 year.  Given the fact that she has trialed strengthening without any relief we did discuss potential operative indications.  Typically I do believe that she would be a candidate for gluteus medius and minimus  repair with possible collagen patch augmentation.  She would like to consider this given her 3-year history of left hip pain.  I did discuss that  there are a few risk factors which would potentially limit her outcome including chronic narcotic usage as well as only 75% response to a lateral hip injection.  After long discussion she has elected for left hip gluteus medius and minimus repair  Plan :    -Plan for left hip gluteus medius and minimus repair with collagen patch augmentation   After a lengthy discussion of treatment options, including risks, benefits, alternatives, complications of surgical and nonsurgical conservative options, the patient elected surgical repair.   The patient  is aware of the material risks  and complications including, but not limited to injury to adjacent structures, neurovascular injury, infection, numbness, bleeding, implant failure, thermal burns, stiffness, persistent pain, failure to heal, disease transmission from allograft, need for further surgery, dislocation, anesthetic risks, blood clots, risks of death,and others. The probabilities of surgical success and failure discussed with patient given their particular co-morbidities.The time and nature of expected rehabilitation and recovery was discussed.The patient's questions were all answered preoperatively.  No barriers to understanding were noted. I explained the natural history of the disease process and Rx rationale.  I explained to the patient what I considered to be reasonable expectations given their personal situation.  The final treatment plan was arrived at through a shared patient decision making process model.      I personally saw and evaluated the patient, and participated in the management and treatment plan.  Huel Cote, MD Attending Physician, Orthopedic Surgery  This document was dictated using Dragon voice recognition software. A reasonable attempt at proof reading has been made to  minimize errors.

## 2022-09-22 ENCOUNTER — Ambulatory Visit: Payer: 59 | Attending: Cardiology | Admitting: Cardiology

## 2022-09-22 VITALS — BP 118/70 | HR 64 | Ht 62.0 in | Wt 131.0 lb

## 2022-09-22 DIAGNOSIS — G909 Disorder of the autonomic nervous system, unspecified: Secondary | ICD-10-CM | POA: Diagnosis not present

## 2022-09-22 DIAGNOSIS — E782 Mixed hyperlipidemia: Secondary | ICD-10-CM

## 2022-09-22 DIAGNOSIS — R0789 Other chest pain: Secondary | ICD-10-CM

## 2022-09-22 DIAGNOSIS — R55 Syncope and collapse: Secondary | ICD-10-CM | POA: Diagnosis not present

## 2022-09-22 MED ORDER — ISOSORBIDE MONONITRATE ER 60 MG PO TB24
60.0000 mg | ORAL_TABLET | Freq: Every day | ORAL | 1 refills | Status: DC
Start: 2022-09-22 — End: 2023-03-19

## 2022-09-22 NOTE — Patient Instructions (Signed)
Medication Instructions:   Increase Imdur to 60mg daily.   Continue all other medications.    Labwork: none  Testing/Procedures: none  Follow-Up: 6 months   Any Other Special Instructions Will Be Listed Below (If Applicable).  If you need a refill on your cardiac medications before your next appointment, please call your pharmacy.  

## 2022-09-22 NOTE — Telephone Encounter (Signed)
I have faxed cardiac clearance notes to requesting provider's office.

## 2022-09-22 NOTE — Telephone Encounter (Signed)
Preoperative team, patient had with Dr. Wyline Mood in cardiology clinic today.  Please forward note to requesting office.  Thank you for your help.  Thomasene Ripple. Linnaea Ahn NP-C     09/22/2022, 2:22 PM [facility] [address] Office [phone] Fax [phone]

## 2022-09-22 NOTE — Progress Notes (Signed)
Clinical Summary [name] is a 67 y.o.female seen today for follow up of the following medical problems.    1. 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 Issues getting arranged with transporation.        - we changed florinef to 0.1mg  every other day due to high bp's  -bp's have done well. Rare highs, no specific lows. No recent symptoms        2. Chest pain - several year history of chest pain - normal cath in 2013, normal stress test 2013 - admit to [facility] 07/2014 with chest pain. Negative workup for ACS. She was discharged with an out patient Lexiscan which showed no evidence of ischemia.   -  echo 11/2014 LVEF 55-60%, no WMAs.  - 09/2017 nuclear stress no ischemia.  - started on imdur by pcp with improved symptoms.      12/2018 admission with chest pain - no objective evidence of ischemia by EKG or enzymes this admission - echo LVE 60-65%, grade I diastolic dysfunction  -  symptoms were noncardiac in description. Lasting constantly x 6 days, worst with deep breathing.   -chronic ches tpain well controlled, has done better on imdur.   - some chest pains at times. Can be constant up to 2 days. Similar to her chest pain over the last 10+ years though liltte more intense Occurs 1-2 times per week, typically lasts about 30 minutes. Better with SL NG. Nonexertional symptoms.    3. Hyperlipidemia -12/2020 TC 125 TG 81 HDL 43 LDL 66 - 06/2021 TC 2841 TG 83 HDL 63 LDL 73 - 10/2021 TC 324 TG 401 HDL 60 LDL 74 - 05/2022 TC 027 TG 253 HDL 63 LDL 76 - she is on crestor     4.Preop evaluation - considering left gluteus repair under general anesthesia - tolerates greater 4 METs regularly  without symptoms.    T[place] cruises [place] on [place] regular.  Past Medical History:  Diagnosis Date   Allergy    Anemia    Anginal pain (HCC)    last time    Anxiety    Arthritis    RHEUMATOID   Asthma    Bipolar 1 disorder (HCC)    Blood transfusion without reported diagnosis    Cataracts, bilateral 07/2017   CHF (congestive heart failure) (HCC)    COPD (chronic obstructive pulmonary disease) (HCC)    Coronary artery disease    reported hx of "MI";  Echo 2009 with normal LVF;  Myoview 05/2011: no ischemia   Depression    Diabetes mellitus without complication (HCC)    Dyslipidemia    Dysrhythmia    SVT   Esophageal stricture    Fibromyalgia    GERD (gastroesophageal reflux disease)    H/O hiatal hernia    Head injury, unspecified    Headache    migraines   Herpes simplex infection    History of kidney stones    History of loop recorder    Managed by Dr. Sharrell Ku   Hyperlipidemia    Hypertension    Insomnia    Myocardial infarction [facility])    age 45   Osteoporosis    Pneumonia    hx  Seizures (HCC)    Shortness of breath    Sleep apnea    ? neg   Spinal stenosis of lumbar region    Spondylolisthesis    Status post placement of implantable loop recorder    Supraventricular tachycardia    Syncope and collapse    s/p ILR; no arhythmogenic cause identified   UTI (lower urinary tract infection)      Allergies  Allergen Reactions   Codeine Other (See Comments)    "I will have a heart attack."   Morphine And Codeine Other (See Comments)    "It will cause me to have a heart attack."   Ambien [Zolpidem Tartrate] Nausea And Vomiting   Clonidine Derivatives Nausea And Vomiting    gerd - caused acid reflux per pt   Metformin And Related Nausea And Vomiting    cramping from metformin   Lyrica [Pregabalin] Swelling and Other (See Comments)    Weight gain   Neurontin [Gabapentin] Other (See Comments)    Causes elevated LFTs      Current Outpatient  Medications  Medication Sig Dispense Refill   Accu-Chek Softclix Lancets lancets TEST BLOOD SUGAR UP TO 4 TIMES A DAY AS DIRECTED. R73.03 400 each 3   acetaminophen (TYLENOL) 500 MG tablet Take 1 tablet (500 mg total) by mouth every 8 (eight) hours for 10 days. 30 tablet 0   acyclovir ointment (ZOVIRAX) 5 % APPLY TOPICALLY EVERY 3 HOURS. 30 g 1   albuterol (VENTOLIN HFA) 108 (90 Base) MCG/ACT inhaler INHALE 2 PUFFS EVERY 6 HOURS AS NEEDED FOR SHORTNESS OF BREATH AND WHEEZING. 6.7 g 2   Alcohol Swabs (B-D SINGLE USE SWABS REGULAR) PADS USE 1 PAD DAILY WHEN CHECKING BLOOD SUGAR. R73.03 100 each 3   amoxicillin-clavulanate (AUGMENTIN) 875-125 MG tablet Take 1 tablet by mouth every 12 (twelve) hours. 14 tablet 0   aspirin EC 325 MG tablet Take 1 tablet (325 mg total) by mouth daily. 30 tablet 0   aspirin EC 81 MG EC tablet Take 1 tablet (81 mg total) by mouth daily. Swallow whole. 30 tablet 0   blood glucose meter kit and supplies Dispense based on patient and insurance preference. Use up to four times daily as directed. (FOR ICD-10 E10.9, E11.9). 1 each 0   busPIRone (BUSPAR) 10 MG tablet Take 1 tablet (10 mg total) by mouth 2 (two) times daily as needed. 60 tablet 5   butalbital-acetaminophen-caffeine (FIORICET) 50-325-40 MG tablet Take 1 tablet by mouth every 6 (six) hours as needed for headache. 20 tablet 2   carvedilol (COREG) 3.125 MG tablet TAKE 1/2 (0.5) TABLET TWICE A DAY WITH A MEAL; BLOOD PRESSURE OVER 150 BEFORE TAKING. 90 tablet 1   celecoxib (CELEBREX) 200 MG capsule Take 1 capsule (200 mg total) by mouth daily. 30 capsule 1   Deutetrabenazine (AUSTEDO) 6 MG TABS Take 1 tablet by mouth 2 (two) times daily. 60 tablet 2   dexlansoprazole (DEXILANT) 60 MG capsule Take 1 capsule (60 mg total) by mouth daily. 90 capsule 1   diclofenac Sodium (VOLTAREN) 1 % GEL APPLY 4 GRAMS TOPICALLY 4 TIMES A DAY. (Patient taking differently: Apply 4 g topically 4 (four) times daily as needed (pain).) 100 g 0    DULoxetine (CYMBALTA) 60 MG capsule Take 1 capsule (60 mg total) by mouth at bedtime. 90 capsule 1   empagliflozin (JARDIANCE) 25 MG TABS tablet take 1 tablet (25 MILLIGRAM total) by mouth daily before breakfast. 30 tablet 1   escitalopram (LEXAPRO)  20 MG tablet Take 1 tablet (20 mg total) by mouth daily. 90 tablet 1   estradiol (ESTRACE) 0.1 MG/GM vaginal cream Place 1 Applicatorful vaginally 3 (three) times a week. 42.5 g 3   FIBER PO Take 1 tablet by mouth in the morning and at bedtime.     fludrocortisone (FLORINEF) 0.1 MG tablet TAKE 1 TABLET ( TOTAL) BY MOUTH EVERY OTHER DAY. 15 tablet 0   fluticasone (FLONASE) 50 MCG/ACT nasal spray USE 1 SPRAY IN EACH NOSTRIL ONCE DAILY. (Patient taking differently: Place 1 spray into both nostrils daily as needed for allergies.) 16 g 5   fluticasone furoate-vilanterol (BREO ELLIPTA) 100-25 MCG/ACT AEPB Inhale 1 puff into the lungs daily. 1 each 11   furosemide (LASIX) 40 MG tablet TAKE 1 TABLET DAILY AS NEEDED FOR EXCESSIVE FLUID. 30 tablet 1   Glucagon, rDNA, (GLUCAGON EMERGENCY) 1 MG KIT Inject 1 mg into the skin as needed. 1 kit 5   glucose blood (ACCU-CHEK GUIDE) test strip TEST BLOOD SUGAR UP TO 4 TIMES A DAY AS DIRECTED. R73.03 400 strip 3   hydrALAZINE (APRESOLINE) 25 MG tablet TAKE 1 TABLET BY MOUTH 3 TIMES DAILY AS NEEDED (FOR SEVERE HYPERTENSION/SYSTOLIC NUMBER 170 OR GREATER). 90 tablet 1   HYDROcodone-acetaminophen (NORCO) 7.5-325 MG tablet take 1 tablet by mouth in the morning, at noon, and at bedtime. 90 tablet 0   ipratropium (ATROVENT) 0.02 % nebulizer solution USE 1 VIAL ( ) IN NEBULIZER EVERY 6 HOURS AS NEEDED FOR WHEEZING OR SHORTNESS OF BREATH. 150 mL 2   isosorbide mononitrate (IMDUR) 30 MG 24 hr tablet Take 1.5 tablets (45 mg total) by mouth daily. 45 tablet 6   lamoTRIgine (LAMICTAL) 150 MG tablet Take 1 tablet (150 mg total) by mouth daily. 30 tablet 1   levETIRAcetam (KEPPRA) 500 MG tablet Take 1 tablet (500 mg total) by  mouth 2 (two) times daily. 180 tablet 1   levocetirizine (XYZAL) 5 MG tablet TAKE 1 TABLET BY MOUTH EVERY MORNING. 30 tablet 2   lidocaine (LIDODERM) 5 % Place 1 patch onto the skin daily. Remove & Discard patch within 12 hours or as directed by MD 30 patch 3   lidocaine (XYLOCAINE) 5 % ointment APPLY TO AFFECTED AREA 3 TIMES A DAY AS NEEDED FOR MILD OR MODERATE PAIN. 35.44 g 1   methocarbamol (ROBAXIN) 500 MG tablet Take 1 tablet (500 mg total) by mouth every 8 (eight) hours as needed for muscle spasms. 30 tablet 0   methocarbamol (ROBAXIN) 750 MG tablet Take 1 tablet (750 mg total) by mouth every 12 (twelve) hours as needed for muscle spasms. 30 tablet 0   nitroGLYCERIN (NITROSTAT) 0.4 MG SL tablet PLACE ONE (1) TABLET UNDER TONGUE EVERY 5 MINUTES UP TO (3) DOSES AS NEEDED FOR CHEST PAIN. 25 tablet 0   ondansetron (ZOFRAN ODT) 4 MG disintegrating tablet Take 1 tablet (4 mg total) by mouth every 8 (eight) hours as needed for nausea or vomiting. 20 tablet 0   ondansetron (ZOFRAN) 4 MG tablet TAKE 1 TABLET BY MOUTH EVERY 8 HOURS AS NEEDED FOR NAUSEA AND VOMITING. 20 tablet 0   oxybutynin (DITROPAN-XL) 5 MG 24 hr tablet Take 1 tablet (5 mg total) by mouth at bedtime. 90 tablet 1   oxyCODONE (ROXICODONE) 5 MG immediate release tablet Take 1 tablet (5 mg total) by mouth every 4 (four) hours as needed for severe pain or breakthrough pain. 30 tablet 0   Probiotic Product (PROBIOTIC PO) Take 1 capsule by mouth  daily.     rosuvastatin (CRESTOR) 10 MG tablet Take 1 tablet (10 mg total) by mouth daily. 90 tablet 1   tetrabenazine (XENAZINE) 12.5 MG tablet Take 1 tablet (12.5 mg total) by mouth daily. 30 tablet 2   traZODone (DESYREL) 150 MG tablet Take 1 tablet (150 mg total) by mouth at bedtime. 90 tablet 1   valACYclovir (VALTREX) 1000 MG tablet Take 1 tablet (1,000 mg total) by mouth 3 (three) times daily. 21 tablet 2   valACYclovir (VALTREX) 500 MG tablet TAKE (1) TABLET TWICE DAILY. 180 tablet 1   No  current facility-administered medications for this visit.     Past Surgical History:  Procedure Laterality Date   BACK SURGERY     BREAST EXCISIONAL BIOPSY Right    1990s   BREAST SURGERY     lumpectomy   CARDIAC CATHETERIZATION  10/06/2011   CATARACT EXTRACTION W/PHACO Right 07/31/2017   Procedure: CATARACT EXTRACTION PHACO AND INTRAOCULAR LENS PLACEMENT (IOC);  Surgeon: Fabio Pierce, MD;  Location: [facility];  Service: Ophthalmology;  Laterality: Right;  CDE: 2.33   CATARACT EXTRACTION W/PHACO Left 08/14/2017   Procedure: CATARACT EXTRACTION PHACO AND INTRAOCULAR LENS PLACEMENT (IOC);  Surgeon: Fabio Pierce, MD;  Location: [facility];  Service: Ophthalmology;  Laterality: Left;  CDE: 2.74   CHOLECYSTECTOMY     CYSTOSCOPY     stone   DIAGNOSTIC LAPAROSCOPY     laparoscopic cholecystectomy   DOPPLER ECHOCARDIOGRAPHY  2009   ESOPHAGOGASTRODUODENOSCOPY N/A 10/31/2020   Procedure: ESOPHAGOGASTRODUODENOSCOPY (EGD);  Surgeon: Gaynelle Adu, MD;  Location: [facility];  Service: General;  Laterality: N/A;   EYE SURGERY Bilateral 08/14/2017   cataract removal   head up tilt table testing  06/15/2007   [name]   HEMORRHOID SURGERY     HERNIA REPAIR     insertion of implatable loop recorder  08/11/2007   [name]   POSTERIOR CERVICAL FUSION/FORAMINOTOMY N/A 12/19/2013   Procedure: RIGHT C3-4.C4-5 AND C5-6 FORAMINOTOMIES;  Surgeon: Kerrin Champagne, MD;  Location: [facility] OR;  Service: Orthopedics;  Laterality: N/A;   TOTAL HIP ARTHROPLASTY Left 01/31/2020   Procedure: LEFT TOTAL HIP ARTHROPLASTY ANTERIOR APPROACH;  Surgeon: Kathryne Hitch, MD;  Location: [facility] OR;  Service: Orthopedics;  Laterality: Left;   TOTAL SHOULDER ARTHROPLASTY Right 11/15/2019   Procedure: RIGHT TOTAL SHOULDER ARTHROPLASTY;  Surgeon: Cammy Copa, MD;  Location: [facility];  Service: Orthopedics;  Laterality: Right;   TOTAL SHOULDER REVISION Right 06/11/2021   Procedure: RIGHT SHOULDER CONVERSION TOTAL SHOULDER  ARTHROPLASTY to REVERSE TOTAL SHOULDER ARTHROPLASTY;  Surgeon: Cammy Copa, MD;  Location: [facility] OR;  Service: Orthopedics;  Laterality: Right;   TUBAL LIGATION     UPPER GASTROINTESTINAL ENDOSCOPY     XI ROBOTIC ASSISTED HIATAL HERNIA REPAIR N/A 10/31/2020   Procedure: XI ROBOTIC ASSISTED HIATAL HERNIA REPAIR WITH PARTIAL FUNDOPLICATION;  Surgeon: Gaynelle Adu, MD;  Location: [facility];  Service: General;  Laterality: N/A;     Allergies  Allergen Reactions   Codeine Other (See Comments)    "I will have a heart attack."   Morphine And Codeine Other (See Comments)    "It will cause me to have a heart attack."   Ambien [Zolpidem Tartrate] Nausea And Vomiting   Clonidine Derivatives Nausea And Vomiting    gerd - caused acid reflux per pt   Metformin And Related Nausea And Vomiting    cramping from metformin   Lyrica [Pregabalin] Swelling and Other (See Comments)    Weight  gain   Neurontin [Gabapentin] Other (See Comments)    Causes elevated LFTs       Family History  Problem Relation Age of Onset   Heart attack Father    Mental illness Father    Mental illness Mother    Heart attack Brother        stents   Alcohol abuse Brother    Heart disease Brother    Drug abuse Brother    Diabetes Brother    Colon cancer Maternal Aunt    Cirrhosis Brother    Stomach cancer Neg Hx    Esophageal cancer Neg Hx    Rectal cancer Neg Hx      Social History [name] reports that she has never smoked. She has never used smokeless tobacco. [name] reports no history of alcohol use.   Review of Systems CONSTITUTIONAL: No weight loss, fever, chills, weakness or fatigue.  HEENT: Eyes: No visual loss, blurred vision, double vision or yellow sclerae.No hearing loss, sneezing, congestion, runny nose or sore throat.  SKIN: No rash or itching.  CARDIOVASCULAR: per hpi RESPIRATORY: No shortness of breath, cough or sputum.  GASTROINTESTINAL: No anorexia, nausea, vomiting or diarrhea.  No abdominal pain or blood.  GENITOURINARY: No burning on urination, no polyuria NEUROLOGICAL: No headache, dizziness, syncope, paralysis, ataxia, numbness or tingling in the extremities. No change in bowel or bladder control.  MUSCULOSKELETAL: No muscle, back pain, joint pain or stiffness.  LYMPHATICS: No enlarged nodes. No history of splenectomy.  PSYCHIATRIC: No history of depression or anxiety.  ENDOCRINOLOGIC: No reports of sweating, cold or heat intolerance. No polyuria or polydipsia.  [name]   Physical Examination Today's Vitals   09/22/22 0835  BP: 118/70  Pulse: 64  SpO2: 98%  Weight: 131 lb (59.4 kg)  Height: 5\' 2"  (1.575 m)   Body mass index is 23.96 kg/m.  Gen: resting comfortably, no acute distress HEENT: no scleral icterus, pupils equal round and reactive, no palptable cervical adenopathy,  CV: RRR, no m/rg, no jvd Resp: Clear to auscultation bilaterally GI: abdomen is soft, non-tender, non-distended, normal bowel sounds, no hepatosplenomegaly MSK: extremities are warm, no edema.  Skin: warm, no rash Neuro:  no focal deficits Psych: appropriate affect   Diagnostic Studies 05/2011 Lexiscan MPI No ischemia     Jan 2009 Echo LEFT VENTRICLE: - Left ventricular size was normal. - Overall left ventricular systolic function was normal. - There were no left ventricular regional wall motion abnormalities. - Left ventricular wall thickness was normal.  AORTIC VALVE: - The aortic valve was trileaflet. - Aortic valve thickness was normal.  AORTA: - The aortic root was normal in size. - The aortic arch was normal.  MITRAL VALVE: - Mitral valve structure was normal.  Doppler interpretation(s): - There was trivial mitral valvular regurgitation.  LEFT ATRIUM: - Left atrial size was normal.  PULMONARY VEINS: - The pulmonary veins were grossly normal.  RIGHT VENTRICLE: - Right ventricular size was normal. - Right ventricular systolic function was normal. -  Right ventricular wall thickness was normal.  PULMONIC VALVE: - The structure of the pulmonic valve appeared to be normal.  TRICUSPID VALVE: - The tricuspid valve structure was normal.  Doppler interpretation(s): - There was no significant tricuspid valvular regurgitation.  PULMONARY ARTERY: - The pulmonary artery was normal size.  RIGHT ATRIUM: - Right atrial size was normal.  SYSTEMIC VEINS: - The inferior vena cava was normal.  PERICARDIUM: - There was no pericardial effusion.  ---------------------------------------------------------------  SUMMARY - Overall left ventricular systolic function was normal. There were no left ventricular regional wall motion abnormalities. - The pulmonary veins were grossly normal.     09/2011 Cath Hemodynamic Findings:   Central aortic pressure: 108/58   Left ventricular pressure: 107/10/14   Angiographic Findings:   Left main: No obstructive disease noted.   Left Anterior Descending Artery: Moderate to large sized vessel that courses to the apex. There is a moderate sized diagonal Caius Silbernagel. No obstructive disease noted.   Circumflex Artery: Large, dominant artery with moderate sized first obtuse marginal Havard Radigan and left sided posterolateral Marcha Licklider with no disease noted.   Right Coronary Artery: Small, non-dominant vessel with no disease noted.   Left Ventricular Angiogram:LVEF=65%.   Impression:   1. No angiographic evidence of CAD   2. Normal LV systolic function   3. Non-cardiac chest pain   Recommendations: No further ischemic workup.   Complications: None. The patient tolerated the procedure well.     11/2014 echo Study Conclusions  - Left ventricle: The cavity size was normal. Systolic function was   normal. The estimated ejection fraction was in the range of 55%   to 60%. Wall motion was normal; there were no regional wall   motion abnormalities. There was an increased relative   contribution of atrial contraction to  ventricular filling.   Doppler parameters are consistent with abnormal left ventricular   relaxation (grade 1 diastolic dysfunction). - Mitral valve: There was trivial regurgitation. - Tricuspid valve: There was trivial regurgitation. - Pulmonic valve: There was trivial regurgitation.      09/2017 nuclear stress There was no ST segment deviation noted during stress. Nonspecific T wave flattening in aVL and V2 seen throughout study. Defect 1: There is a medium defect of mild severity present in the mid inferior, apical inferior and apical lateral location. This appears to be due to soft tissue attenuation given normal regional wall motion. No ischemic territories. This is a low risk study. Nuclear stress EF: 65%.        Assessment and Plan  1. Syncope/Autonomic dysfunction/HTN - difficult balance between severe symptomatic both hypotension and HTN.  -unorthodox regimen but through trial and error has done fairly well for her all things considred and we have continued - continues to do well, continue current meds     2. Chest pain - long history of symptoms with multiple negative stress tests, most recently 09/2017  Lexiscan was negative. Cath 2013 with patent vessels. Symptoms did improve with imdur -some ongoing symptoms, increase imdur to 60mg  daily.  - EKG today SR, no acute ischemic changes   3. Hyperlipidemia - she is at goal, continue current meds  4. Preoperative evaluation - tolerates greater than 4 METs regularly without exertional symptoms - prior cardiac testing over the years has been benign. - ok from cardiac standpoint to proceed with gluteus surgery      Antoine Poche, M.D.

## 2022-09-22 NOTE — Telephone Encounter (Signed)
Ok to proceed with surgery from cardiac standpoint. Clearance has been documented in todays clinic note as well.  Dominga Ferry MD

## 2022-09-23 ENCOUNTER — Other Ambulatory Visit: Payer: Self-pay | Admitting: Nurse Practitioner

## 2022-09-23 ENCOUNTER — Ambulatory Visit (INDEPENDENT_AMBULATORY_CARE_PROVIDER_SITE_OTHER): Payer: 59 | Admitting: Nurse Practitioner

## 2022-09-23 ENCOUNTER — Encounter: Payer: Self-pay | Admitting: Nurse Practitioner

## 2022-09-23 VITALS — BP 127/86 | HR 66 | Temp 98.5°F | Resp 20 | Ht 62.0 in | Wt 131.0 lb

## 2022-09-23 DIAGNOSIS — M47817 Spondylosis without myelopathy or radiculopathy, lumbosacral region: Secondary | ICD-10-CM | POA: Diagnosis not present

## 2022-09-23 DIAGNOSIS — R748 Abnormal levels of other serum enzymes: Secondary | ICD-10-CM | POA: Diagnosis not present

## 2022-09-23 DIAGNOSIS — Q762 Congenital spondylolisthesis: Secondary | ICD-10-CM | POA: Diagnosis not present

## 2022-09-23 DIAGNOSIS — R7303 Prediabetes: Secondary | ICD-10-CM

## 2022-09-23 DIAGNOSIS — F411 Generalized anxiety disorder: Secondary | ICD-10-CM

## 2022-09-23 DIAGNOSIS — B009 Herpesviral infection, unspecified: Secondary | ICD-10-CM

## 2022-09-23 LAB — BAYER DCA HB A1C WAIVED: HB A1C (BAYER DCA - WAIVED): 5.7 % — ABNORMAL HIGH (ref 4.8–5.6)

## 2022-09-23 MED ORDER — HYDROCODONE-ACETAMINOPHEN 7.5-325 MG PO TABS
1.0000 | ORAL_TABLET | Freq: Three times a day (TID) | ORAL | 0 refills | Status: AC
Start: 2022-10-23 — End: 2022-11-22

## 2022-09-23 MED ORDER — HYDROCODONE-ACETAMINOPHEN 7.5-325 MG PO TABS
1.0000 | ORAL_TABLET | Freq: Three times a day (TID) | ORAL | 0 refills | Status: AC
Start: 2022-09-23 — End: 2022-11-22

## 2022-09-23 MED ORDER — HYDROCODONE-ACETAMINOPHEN 7.5-325 MG PO TABS
1.0000 | ORAL_TABLET | Freq: Three times a day (TID) | ORAL | 0 refills | Status: DC
Start: 2022-11-22 — End: 2022-12-22

## 2022-09-23 NOTE — Progress Notes (Signed)
Subjective:    Patient ID: [name], female    DOB: 09/13/1955, 67 y.o.   MRN: [id number]   Chief Complaint: pain management  HPI  [name] in today with chief complaint of pain management  1. Lumbosacral spondylosis without myelopathy Pain assessment: Cause of pain- spondylosis Pain location- lower back Pain on scale of 1-10- 6-7/10 Frequency- daily What increases pain-to much activity What makes pain Better-rest helps Effects on ADL - none Any change in general medical condition-none  Current opioids rx- norco 7.5/325 # meds rx- 90 Effectiveness of current meds-helps Adverse reactions from pain meds-none Morphine equivalent- 22.5 MME  Pill count performed-No Last drug screen - 03/27/22 ( high risk q62m, moderate risk q50m, low risk yearly ) Urine drug screen today- No Was the [facility] reviewed- yes  If yes were their any concerning findings? - no   Overdose risk: 1    09/12/2021    3:09 PM  Opioid Risk   Alcohol 0  Illegal Drugs 0  Rx Drugs 0  Alcohol 0  Illegal Drugs 0  Rx Drugs 0  Age between 16-45 years  0  History of Preadolescent Sexual Abuse 3  Psychological Disease 2  ADD Negative  OCD Negative  Bipolar Positive  Depression 1  Opioid Risk Tool Scoring 6  Opioid Risk Interpretation Moderate Risk     Pain contract signed on: 04/02/22      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   Hypokalemia 11/25/2020   S/P laparoscopic fundoplication 10/31/2020   Status post total replacement of left hip 01/31/2020   Unilateral primary osteoarthritis, left hip 12/21/2019   S/P shoulder replacement, right 11/15/2019   Overweight (BMI 25.0-29.9) 04/15/2019   Diverticulosis 04/14/2019   Chronic diastolic CHF (congestive heart failure) (HCC) 04/14/2019   Unstable angina (HCC) 10/20/2017   Chronic pain 09/23/2016    Pre-diabetes 09/02/2016   Recurrent major depressive disorder, in partial remission (HCC) 07/14/2016   Seizures (HCC) 07/14/2016   GAD (generalized anxiety disorder) 07/14/2016   Insomnia 09/05/2014   Allergic rhinitis 09/05/2014   Cervical spondylosis without myelopathy 12/19/2013    Class: Chronic   Neural foraminal stenosis of cervical spine 12/19/2013   Cervical radiculitis 09/19/2013   Lumbosacral spondylosis without myelopathy 11/16/2012   Postlaminectomy syndrome, lumbar region 11/16/2012   Herpes simplex virus (HSV) infection 10/31/2008   Hyperlipidemia with target LDL less than 100 10/31/2008   Essential hypertension 10/31/2008   GERD 10/31/2008   SPINAL STENOSIS OF LUMBAR REGION 10/31/2008   Myalgia 10/31/2008   Osteoporosis 10/31/2008   SPONDYLOLISTHESIS 10/31/2008   SYNCOPE 10/31/2008   Sleep apnea 10/31/2008       Review of Systems  Constitutional:  Negative for diaphoresis.  Eyes:  Negative for pain.  Respiratory:  Negative for shortness of breath.   Cardiovascular:  Negative for chest pain, palpitations and leg swelling.  Gastrointestinal:  Negative for abdominal pain.  Endocrine: Negative for polydipsia.  Skin:  Negative for rash.  Neurological:  Negative for dizziness, weakness and headaches.  Hematological:  Does not bruise/bleed easily.  All other systems reviewed and are negative.      Objective:   Physical Exam Vitals and nursing note reviewed.  Constitutional:      General: She is not in acute distress.    Appearance: Normal appearance. She is well-developed.  Neck:     Vascular:  No carotid bruit or JVD.  Cardiovascular:     Rate and Rhythm: Normal rate and regular rhythm.     Heart sounds: Normal heart sounds.  Pulmonary:     Effort: Pulmonary effort is normal. No respiratory distress.     Breath sounds: Normal breath sounds. No wheezing or rales.  Chest:     Chest wall: No tenderness.  Abdominal:     General: Bowel sounds are normal.  There is no distension or abdominal bruit.     Palpations: Abdomen is soft. There is no hepatomegaly, splenomegaly, mass or pulsatile mass.     Tenderness: There is no abdominal tenderness.  Musculoskeletal:        General: Normal range of motion.     Cervical back: Normal range of motion and neck supple.  Lymphadenopathy:     Cervical: No cervical adenopathy.  Skin:    General: Skin is warm and dry.  Neurological:     Mental Status: She is alert and oriented to person, place, and time.     Deep Tendon Reflexes: Reflexes are normal and symmetric.  Psychiatric:        Behavior: Behavior normal.        Thought Content: Thought content normal.        Judgment: Judgment normal.   BP 127/86   Pulse 66   Temp 98.5 F (36.9 C) (Temporal)   Resp 20   Ht 5\' 2"  (1.575 m)   Wt 131 lb (59.4 kg)   SpO2 97%   BMI 23.96 kg/m         Assessment & Plan:  [name] in today with chief complaint of Pain Management   1. Lumbosacral spondylosis without myelopathy  2. SPONDYLOLISTHESIS - HYDROcodone-acetaminophen (NORCO) 7.5-325 MG tablet; Take 1 tablet by mouth in the morning, at noon, and at bedtime.  Dispense: 90 tablet; Refill: 0 - HYDROcodone-acetaminophen (NORCO) 7.5-325 MG tablet; Take 1 tablet by mouth in the morning, at noon, and at bedtime.  Dispense: 90 tablet; Refill: 0 - HYDROcodone-acetaminophen (NORCO) 7.5-325 MG tablet; Take 1 tablet by mouth in the morning, at noon, and at bedtime.  Dispense: 90 tablet; Refill: 0  Getting ready to have hip surgery- told patient to tell surgeon that she has a pain contract and he should not prescribe her nay pain meds. *repeat labs  The above assessment and management plan was discussed with the patient. The patient verbalized understanding of and has agreed to the management plan. Patient is aware to call the clinic if symptoms persist or worsen. Patient is aware when to return to the clinic for a follow-up visit. Patient educated on  when it is appropriate to go to the emergency department.   [name] Daphine Deutscher, FNP

## 2022-09-23 NOTE — Patient Instructions (Signed)
Opioid Pain Medicine Management Opioid pain medicines are strong medicines that are used to treat bad or very bad pain. When you take them for a short time, they can help you: Sleep better. Do better in physical therapy. Feel better during the first few days after you get hurt. Recover from surgery. Only take these medicines if a doctor says that you can. You should only take them for a short time. This is because opioids can be very addictive. This means that they are hard to stop taking. The longer you take opioids, the harder it may be to stop taking them. What are the risks? Opioids can cause problems (side effects). Taking them for more than 3 days raises your chance of problems, such as: Trouble pooping (constipation). Feeling sick to your stomach (nausea). Vomiting. Feeling very sleepy. Confusion. Not being able to stop taking the medicine. Breathing problems. Taking opioids for a long time can make it hard for you to do daily tasks. It can also put you at risk for: Car accidents. Depression. Suicide. Heart attack. Taking too much of the medicine (overdose). This can lead to death. What is a pain treatment plan? A pain treatment plan is a plan made by you and your doctor. Work with your doctor to make a plan for treating your pain. To help you do this: Talk about the goals of your treatment, including: How much pain you might expect to have. How you will manage the pain. Talk about the risks and benefits of taking these medicines for your condition. Remember that a good treatment plan uses more than one approach and lowers the risks of side effects. Tell your doctor about the amount of medicines you take and about any drug or alcohol use. Get your pain medicine prescriptions from only one doctor. Pain can be managed with other treatments. Work with your doctor to find other ways to help your pain, such as: Physical therapy or doing gentle exercises. Counseling. Eating healthy  foods. Massage. Meditation. Other pain medicines. How to use opioid pain medicine safely Taking medicine Take your pain medicine exactly as told by your doctor. Take it only when you need it. If your pain is not too bad, you may take less medicine if your doctor allows. If you have no pain, do not take the medicine unless your doctor tells you to take it. If your pain is very bad, do not take more medicine than your doctor told you to take. Call your doctor to know what to do. Write down the times when you take your pain medicine. Look at the times before you take your next dose. Take other over-the-counter or prescription medicines only as told by your doctor. Keeping yourself and others safe  While you are taking opioids: Do not drive, use machines, or power tools. Do not sign important papers (legal documents). Do not drink alcohol. Do not take sleeping pills. Do not take care of children by yourself. Do not do activities where you need to climb or be in high places, like working on a ladder. Do not go to a lake, river, ocean, swimming pool, or hot tub. Keep your opioids locked up or in a place where children cannot reach them. Do not share your pain medicine with anyone. Stopping your use of opioids If you have been taking opioids for more than a few weeks, you may need to slowly decrease (taper) how much you take until you stop taking them. Doing this can lower your chance   of having symptoms.  Symptoms that come from suddenly stopping the use of opioids include: Pain and cramping in your belly (abdomen). Feeling sick to your stomach (nausea).z Sweating. Feeling very sleepy. Feeling restless. Shaking you cannot control (tremors). Cravings for the medicine. Do not try to stop taking them by yourself. Work with your doctor to stop. Your doctor will help you take less until you are not taking the medicine at all. Getting rid of unused pills Do not save any pills that you did not  use. Get rid of the pills by: Taking them to a take-back program in your area. Bringing them to a pharmacy that receives unused pills. Flushing them down the toilet. Check the label or package insert of your medicine to see whether this is safe to do. Throwing them in the trash. Check the label or package insert of your medicine to see whether this is safe to do. If it is safe to throw them out: Take the pills out of their container. Put the pills into a container you can seal. Mix the pills with used coffee grounds, food scraps, dirt, or cat litter. Put this in the trash. Follow these instructions at home: Activity Do exercises as told by your doctor. Avoid doing things that make your pain worse. Return to your normal activities as told by your doctor. Ask your doctor what activities are safe for you. General instructions You may need to take these actions to prevent or treat constipation: Drink enough fluid to keep your pee (urine) pale yellow. Take over-the-counter or prescription medicines. Eat foods that are high in fiber. These include beans, whole grains, and fresh fruits and vegetables. Limit foods that are high in fat and sugar. These include fried or sweet foods. Keep all follow-up visits. Where to find support If you have been taking opioids for a long time, get help from a local support group or counselor. Ask your doctor about this. Where to find more information Centers for Disease Control and Prevention (CDC): www.cdc.gov [place] Food and Drug Administration (FDA): www.fda.gov Get help right away if: You may have taken too much of an opioid (overdosed). Common symptoms of an overdose: Your breathing is slower or more shallow than normal. You have a very slow heartbeat. Your speech is not normal. You vomit or you feel as if you may vomit. The black centers of your eyes (pupils) are smaller than normal. You have other potential symptoms: You feel very confused. You  faint. You are very sleepy. You have cold skin. You have blue lips or fingernails. You have thoughts of harming yourself or harming others. These symptoms may be an emergency. Get help right away. Call your local emergency services (911 in the [place] Do not wait to see if the symptoms will go away. Do not drive yourself to the hospital. Get help right away if you feel like you may hurt yourself or others, or have thoughts about taking your own life. Go to your nearest emergency room or: Call your local emergency services (911 in the [place] Call the National Poison Control Center at 1-800-222-1222. Call a suicide crisis helpline, such as the National Suicide Prevention Lifeline at 1-800-273-8255 or 988 in the [place] This is open 24 hours a day. Text the Crisis Text Line at 741741. Summary Opioid are strong medicines that are used to treat bad or very bad pain. A pain treatment plan is a plan made by you and your doctor. Work with your doctor to make a   plan for treating your pain. If you think that you or someone else may have taken too much of an opioid, get help right away. This information is not intended to replace advice given to you by your health care provider. Make sure you discuss any questions you have with your health care provider. Document Revised: 10/10/2020 Document Reviewed: 06/27/2020 Elsevier Patient Education  2024 [employer].

## 2022-09-24 LAB — CMP14+EGFR
ALT: 25 IU/L (ref 0–32)
AST: 23 IU/L (ref 0–40)
Albumin: 3.9 g/dL (ref 3.9–4.9)
Alkaline Phosphatase: 93 IU/L (ref 44–121)
BUN/Creatinine Ratio: 15 (ref 12–28)
BUN: 11 mg/dL (ref 8–27)
Bilirubin Total: 0.2 mg/dL (ref 0.0–1.2)
CO2: 24 mmol/L (ref 20–29)
Calcium: 9.4 mg/dL (ref 8.7–10.3)
Chloride: 108 mmol/L — ABNORMAL HIGH (ref 96–106)
Creatinine, Ser: 0.75 mg/dL (ref 0.57–1.00)
Globulin, Total: 2.2 g/dL (ref 1.5–4.5)
Glucose: 108 mg/dL — ABNORMAL HIGH (ref 70–99)
Potassium: 4.1 mmol/L (ref 3.5–5.2)
Sodium: 145 mmol/L — ABNORMAL HIGH (ref 134–144)
Total Protein: 6.1 g/dL (ref 6.0–8.5)
eGFR: 88 mL/min/{1.73_m2} (ref >59)

## 2022-09-29 ENCOUNTER — Other Ambulatory Visit: Payer: Self-pay

## 2022-09-29 DIAGNOSIS — Z780 Asymptomatic menopausal state: Secondary | ICD-10-CM

## 2022-10-06 ENCOUNTER — Other Ambulatory Visit (HOSPITAL_BASED_OUTPATIENT_CLINIC_OR_DEPARTMENT_OTHER): Payer: Self-pay | Admitting: Orthopaedic Surgery

## 2022-10-06 DIAGNOSIS — S76012D Strain of muscle, fascia and tendon of left hip, subsequent encounter: Secondary | ICD-10-CM

## 2022-10-08 ENCOUNTER — Other Ambulatory Visit (HOSPITAL_BASED_OUTPATIENT_CLINIC_OR_DEPARTMENT_OTHER): Payer: Self-pay

## 2022-10-08 ENCOUNTER — Encounter: Payer: Self-pay | Admitting: Sports Medicine

## 2022-10-08 ENCOUNTER — Ambulatory Visit (INDEPENDENT_AMBULATORY_CARE_PROVIDER_SITE_OTHER): Payer: 59 | Admitting: Sports Medicine

## 2022-10-08 ENCOUNTER — Other Ambulatory Visit: Payer: Self-pay | Admitting: Nurse Practitioner

## 2022-10-08 ENCOUNTER — Ambulatory Visit: Payer: 59

## 2022-10-08 VITALS — Ht 62.0 in | Wt 120.0 lb

## 2022-10-08 DIAGNOSIS — M25551 Pain in right hip: Secondary | ICD-10-CM

## 2022-10-08 DIAGNOSIS — M79644 Pain in right finger(s): Secondary | ICD-10-CM

## 2022-10-08 DIAGNOSIS — M180 Bilateral primary osteoarthritis of first carpometacarpal joints: Secondary | ICD-10-CM

## 2022-10-08 DIAGNOSIS — M1812 Unilateral primary osteoarthritis of first carpometacarpal joint, left hand: Secondary | ICD-10-CM | POA: Diagnosis not present

## 2022-10-08 DIAGNOSIS — M79645 Pain in left finger(s): Secondary | ICD-10-CM

## 2022-10-08 DIAGNOSIS — M25552 Pain in left hip: Secondary | ICD-10-CM

## 2022-10-08 DIAGNOSIS — Z1231 Encounter for screening mammogram for malignant neoplasm of breast: Secondary | ICD-10-CM | POA: Diagnosis not present

## 2022-10-08 DIAGNOSIS — M1811 Unilateral primary osteoarthritis of first carpometacarpal joint, right hand: Secondary | ICD-10-CM | POA: Diagnosis not present

## 2022-10-08 DIAGNOSIS — S76012D Strain of muscle, fascia and tendon of left hip, subsequent encounter: Secondary | ICD-10-CM

## 2022-10-08 DIAGNOSIS — Z0000 Encounter for general adult medical examination without abnormal findings: Secondary | ICD-10-CM

## 2022-10-08 MED ORDER — LIDOCAINE HCL 1 % IJ SOLN
0.5000 mL | INTRAMUSCULAR | Status: AC | PRN
Start: 2022-10-08 — End: 2022-10-08
  Administered 2022-10-08: .5 mL

## 2022-10-08 MED ORDER — BETAMETHASONE SOD PHOS & ACET 6 (3-3) MG/ML IJ SUSP
6.0000 mg | INTRAMUSCULAR | Status: AC | PRN
Start: 2022-10-08 — End: 2022-10-08
  Administered 2022-10-08: 6 mg via INTRA_ARTICULAR

## 2022-10-08 NOTE — Progress Notes (Signed)
Bilateral thumb pain; left worse than right Inquiring about injections today Does have braces she wears that helps some

## 2022-10-08 NOTE — Progress Notes (Signed)
[name] - 67 y.o. female MRN [id number]  Date of birth: Apr 20, 1955  Office Visit Note: Visit Date: 10/08/2022 PCP: Bennie Pierini, FNP Referred by: Daphine Deutscher, [name], *  Subjective: Chief Complaint  Patient presents with   Left Hand - Pain   HPI: [name] is a pleasant 67 y.o. female who presents today for bilateral thumb pain, bilateral hip pain.   Has planned left hip gluteus medius and minimus repair with collagen patch augmentation. Her right lateral hip is doing well status post injection.  Does have home exercises she will do at times.  Bilateral thumbs (CMC) - left is worse than her right.  This has been exacerbated over the last few weeks.  Pain with gripping at the base of the thumb.  She has been wearing her bilateral CMC brace support which does help.  Using Celebrex 200 mg once daily, also on her chronic hydrocodone-acetaminophen for pain relief.  Pertinent ROS were reviewed with the patient and found to be negative unless otherwise specified above in HPI.   Assessment & Plan: Visit Diagnoses:  1. Bilateral thumb pain   2. Primary osteoarthritis of first carpometacarpal joint of left hand   3. Primary osteoarthritis of first carpometacarpal joint of right hand   4. Tear of left gluteus minimus tendon, subsequent encounter   5. Greater trochanteric pain syndrome of both lower extremities    Plan: Discussed with [name] she is experiencing an exacerbation of her chronic underlying arthritic change of the [facility] joint of both thumbs.  The left is worse than the right but both are bothering her.  Through shared decision-making, did proceed with bilateral CMC joint injections.  She may ice for any postinjection pain.  Will continue to seems to be cool comfort brace for support.  She may continue Celebrex 200 mg once daily as needed, she will continue her chronic hydrocodone-acetaminophen 7.5-325 mg 3 times a day for pain control.  For her gluteus  medius/minimus tear, she has upcoming surgery with Dr. Steward Drone on 7/29.  Her right lateral hip is doing well.  We did discuss the role of trialing extracorporeal shockwave therapy in place of infrequent injection in the future should her pain return.  She will continue her home exercises as able.  Follow-up: Return in about 3 months (around 01/08/2023), or if symptoms worsen or fail to improve.   Meds & Orders: No orders of the defined types were placed in this encounter.   Orders Placed This Encounter  Procedures   Small Joint Inj: L thumb CMC   Small Joint Inj: R thumb CMC     Procedures: Small Joint Inj: L thumb CMC on 10/08/2022 9:22 AM Indications: pain Details: 27 G needle, radial approach Medications: 0.5 mL lidocaine 1 %; 6 mg betamethasone acetate-betamethasone sodium phosphate 6 (3-3) MG/ML Outcome: tolerated well, no immediate complications Procedure, treatment alternatives, risks and benefits explained, specific risks discussed. Consent was given by the patient. Immediately prior to procedure a time out was called to verify the correct patient, procedure, equipment, support staff and site/side marked as required. Patient was prepped and draped in the usual sterile fashion.    Small Joint Inj: R thumb CMC on 10/08/2022 9:22 AM Indications: pain Details: 27 G needle, radial approach Medications: 0.5 mL lidocaine 1 %; 6 mg betamethasone acetate-betamethasone sodium phosphate 6 (3-3) MG/ML Outcome: tolerated well, no immediate complications Procedure, treatment alternatives, risks and benefits explained, specific risks discussed. Consent was given by the patient. Immediately prior to  procedure a time out was called to verify the correct patient, procedure, equipment, support staff and site/side marked as required. Patient was prepped and draped in the usual sterile fashion.          Clinical History: No specialty comments available.  She reports that she has never smoked. She  has never used smokeless tobacco.  Recent Labs    11/26/21 1213 09/23/22 1516  HGBA1C 5.6 5.7*    Objective:   Vital Signs: There were no vitals taken for this visit.  Physical Exam  Gen: Well-appearing, in no acute distress; non-toxic CV: Well-perfused. Warm.  Resp: Breathing unlabored on room air; no wheezing. Psych: Fluid speech in conversation; appropriate affect; normal thought process Neuro: Sensation intact throughout. No gross coordination deficits.   Ortho Exam - Bilateral thumbs: Notable bony bossing of both CMC joints but left greater than right.  There is positive TTP and positive CMC grind test.  Cap refill less than 2 seconds.  Imaging: No results found.  Past Medical/Family/Surgical/Social History: Medications & Allergies reviewed per EMR, new medications updated. 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   Hypokalemia 11/25/2020   S/P laparoscopic fundoplication 10/31/2020   Status post total replacement of left hip 01/31/2020   Unilateral primary osteoarthritis, left hip 12/21/2019   S/P shoulder replacement, right 11/15/2019   Overweight (BMI 25.0-29.9) 04/15/2019   Diverticulosis 04/14/2019   Chronic diastolic CHF (congestive heart failure) (HCC) 04/14/2019   Unstable angina (HCC) 10/20/2017   Chronic pain 09/23/2016   Pre-diabetes 09/02/2016   Recurrent major depressive disorder, in partial remission (HCC) 07/14/2016   Seizures (HCC) 07/14/2016   GAD (generalized anxiety disorder) 07/14/2016   Insomnia 09/05/2014   Allergic rhinitis 09/05/2014   Cervical spondylosis without myelopathy 12/19/2013    Class: Chronic   Neural foraminal stenosis of cervical spine 12/19/2013   Cervical radiculitis 09/19/2013   Lumbosacral spondylosis without myelopathy 11/16/2012   Postlaminectomy syndrome, lumbar region 11/16/2012   Herpes simplex  virus (HSV) infection 10/31/2008   Hyperlipidemia with target LDL less than 100 10/31/2008   Essential hypertension 10/31/2008   GERD 10/31/2008   SPINAL STENOSIS OF LUMBAR REGION 10/31/2008   Myalgia 10/31/2008   Osteoporosis 10/31/2008   SPONDYLOLISTHESIS 10/31/2008   SYNCOPE 10/31/2008   Sleep apnea 10/31/2008   Past Medical History:  Diagnosis Date   Allergy    Anemia    Anginal pain (HCC)    last time    Anxiety    Arthritis    RHEUMATOID   Asthma    Bipolar 1 disorder (HCC)    Blood transfusion without reported diagnosis    Cataracts, bilateral 07/2017   CHF (congestive heart failure) (HCC)    COPD (chronic obstructive pulmonary disease) (HCC)    Coronary artery disease    reported hx of "MI";  Echo 2009 with normal LVF;  Myoview 05/2011: no ischemia   Depression    Diabetes mellitus without complication (HCC)    Dyslipidemia    Dysrhythmia    SVT   Esophageal stricture    Fibromyalgia    GERD (gastroesophageal reflux disease)    H/O hiatal hernia    Head injury, unspecified    Headache    migraines   Herpes simplex infection    History of kidney stones    History of loop recorder    Managed by Dr. Sharrell Ku  Hyperlipidemia    Hypertension    Insomnia    Myocardial infarction [facility])    age 73   Osteoporosis    Pneumonia    hx   Seizures (HCC)    Shortness of breath    Sleep apnea    ? neg   Spinal stenosis of lumbar region    Spondylolisthesis    Status post placement of implantable loop recorder    Supraventricular tachycardia    Syncope and collapse    s/p ILR; no arhythmogenic cause identified   UTI (lower urinary tract infection)    Family History  Problem Relation Age of Onset   Heart attack Father    Mental illness Father    Mental illness Mother    Heart attack Brother        stents   Alcohol abuse Brother    Heart disease Brother    Drug abuse Brother    Diabetes Brother    Colon cancer Maternal Aunt    Cirrhosis Brother     Stomach cancer Neg Hx    Esophageal cancer Neg Hx    Rectal cancer Neg Hx    Past Surgical History:  Procedure Laterality Date   BACK SURGERY     BREAST EXCISIONAL BIOPSY Right    1990s   BREAST SURGERY     lumpectomy   CARDIAC CATHETERIZATION  10/06/2011   CATARACT EXTRACTION W/PHACO Right 07/31/2017   Procedure: CATARACT EXTRACTION PHACO AND INTRAOCULAR LENS PLACEMENT (IOC);  Surgeon: Fabio Pierce, MD;  Location: [facility];  Service: Ophthalmology;  Laterality: Right;  CDE: 2.33   CATARACT EXTRACTION W/PHACO Left 08/14/2017   Procedure: CATARACT EXTRACTION PHACO AND INTRAOCULAR LENS PLACEMENT (IOC);  Surgeon: Fabio Pierce, MD;  Location: [facility];  Service: Ophthalmology;  Laterality: Left;  CDE: 2.74   CHOLECYSTECTOMY     CYSTOSCOPY     stone   DIAGNOSTIC LAPAROSCOPY     laparoscopic cholecystectomy   DOPPLER ECHOCARDIOGRAPHY  2009   ESOPHAGOGASTRODUODENOSCOPY N/A 10/31/2020   Procedure: ESOPHAGOGASTRODUODENOSCOPY (EGD);  Surgeon: Gaynelle Adu, MD;  Location: [facility];  Service: General;  Laterality: N/A;   EYE SURGERY Bilateral 08/14/2017   cataract removal   head up tilt table testing  06/15/2007   [name]   HEMORRHOID SURGERY     HERNIA REPAIR     insertion of implatable loop recorder  08/11/2007   [name]   POSTERIOR CERVICAL FUSION/FORAMINOTOMY N/A 12/19/2013   Procedure: RIGHT C3-4.C4-5 AND C5-6 FORAMINOTOMIES;  Surgeon: Kerrin Champagne, MD;  Location: [facility] OR;  Service: Orthopedics;  Laterality: N/A;   TOTAL HIP ARTHROPLASTY Left 01/31/2020   Procedure: LEFT TOTAL HIP ARTHROPLASTY ANTERIOR APPROACH;  Surgeon: Kathryne Hitch, MD;  Location: [facility] OR;  Service: Orthopedics;  Laterality: Left;   TOTAL SHOULDER ARTHROPLASTY Right 11/15/2019   Procedure: RIGHT TOTAL SHOULDER ARTHROPLASTY;  Surgeon: Cammy Copa, MD;  Location: [facility];  Service: Orthopedics;  Laterality: Right;   TOTAL SHOULDER REVISION Right 06/11/2021   Procedure: RIGHT SHOULDER CONVERSION  TOTAL SHOULDER ARTHROPLASTY to REVERSE TOTAL SHOULDER ARTHROPLASTY;  Surgeon: Cammy Copa, MD;  [facility] OR;  Service: Orthopedics;  Laterality: Right;   TUBAL LIGATION     UPPER GASTROINTESTINAL ENDOSCOPY     XI ROBOTIC ASSISTED HIATAL HERNIA REPAIR N/A 10/31/2020   Procedure: XI ROBOTIC ASSISTED HIATAL HERNIA REPAIR WITH PARTIAL FUNDOPLICATION;  Surgeon: Gaynelle Adu, MD;  Location: [facility];  Service: General;  Laterality: N/A;   Social History   Occupational  History   Occupation: Disability    Comment: 15 years  Tobacco Use   Smoking status: Never   Smokeless tobacco: Never  Vaping Use   Vaping Use: Never used  Substance and Sexual Activity   Alcohol use: No    Alcohol/week: 0.0 standard drinks of alcohol   Drug use: No   Sexual activity: Yes

## 2022-10-08 NOTE — Progress Notes (Signed)
Subjective:   [name] is a 67 y.o. female who presents for Medicare Annual (Subsequent) preventive examination.  Visit Complete: Virtual  I connected with  [name] on 10/08/22 by a audio enabled telemedicine application and verified that I am speaking with the correct person using two identifiers.  Patient Location: Home  Provider Location: Home Office  I discussed the limitations of evaluation and management by telemedicine. The patient expressed understanding and agreed to proceed.  Patient Medicare AWV questionnaire was completed by the patient on 10/08/2022; I have confirmed that all information answered by patient is correct and no changes since this date.  Review of Systems    Nutrition Risk Assessment:  Has the patient had any N/V/D within the last 2 months?  No  Does the patient have any non-healing wounds?  No  Has the patient had any unintentional weight loss or weight gain?  No   Diabetes:  Is the patient diabetic?  Yes  If diabetic, was a CBG obtained today?  No  Did the patient bring in their glucometer from home?  No  How often do you monitor your CBG's? 3 x day .   Financial Strains and Diabetes Management:  Are you having any financial strains with the device, your supplies or your medication? No .  Does the patient want to be seen by Chronic Care Management for management of their diabetes?  No  Would the patient like to be referred to a Nutritionist or for Diabetic Management?  No   Diabetic Exams:  Diabetic Eye Exam: Completed 05/2022 Diabetic Foot Exam: Overdue, Pt has been advised about the importance in completing this exam. Pt is scheduled for diabetic foot exam on next office visit .  Cardiac Risk Factors include: advanced age (>30men, >63 women);diabetes mellitus;dyslipidemia;hypertension     Objective:    Today's Vitals   10/08/22 1436  Weight: 120 lb (54.4 kg)  Height: 5\' 2"  (1.575 m)   Body mass index is 21.95  kg/m.     10/08/2022    2:41 PM 08/16/2022    9:09 AM 09/09/2021    9:09 AM 06/07/2021   11:20 AM 05/07/2021   10:22 AM 11/25/2020    7:54 PM 11/12/2020    3:46 PM  Advanced Directives  Does Patient Have a Medical Advance Directive? No No Yes No No No No  Type of Surveyor, minerals;Living will      Copy of Healthcare Power of Attorney in Chart?   No - copy requested      Would patient like information on creating a medical advance directive? Yes (MAU/Ambulatory/Procedural Areas - Information given) No - Patient declined  No - Patient declined Yes (MAU/Ambulatory/Procedural Areas - Information given) No - Patient declined No - Patient declined    Current Medications (verified) Outpatient Encounter Medications as of 10/08/2022  Medication Sig   Accu-Chek Softclix Lancets lancets TEST BLOOD SUGAR UP TO 4 TIMES A DAY AS DIRECTED. R73.03   acyclovir ointment (ZOVIRAX) 5 % apply topically every 3 hours.   albuterol (VENTOLIN HFA) 108 (90 Base) MCG/ACT inhaler INHALE 2 PUFFS EVERY 6 HOURS AS NEEDED FOR SHORTNESS OF BREATH AND WHEEZING.   Alcohol Swabs (B-D SINGLE USE SWABS REGULAR) PADS USE 1 PAD DAILY WHEN CHECKING BLOOD SUGAR. R73.03   aspirin EC 325 MG tablet Take 1 tablet (325 mg total) by mouth daily.   aspirin EC 81 MG EC tablet Take 1 tablet (81 mg total)  by mouth daily. Swallow whole.   blood glucose meter kit and supplies Dispense based on patient and insurance preference. Use up to four times daily as directed. (FOR ICD-10 E10.9, E11.9).   busPIRone (BUSPAR) 10 MG tablet Take 1 tablet (10 mg total) by mouth 2 (two) times daily as needed.   butalbital-acetaminophen-caffeine (FIORICET) 50-325-40 MG tablet take 1 tablet by mouth every 6 (six) hours as needed for headache.   carvedilol (COREG) 3.125 MG tablet TAKE 1/2 (0.5) TABLET TWICE A DAY WITH A MEAL; BLOOD PRESSURE OVER 150 BEFORE TAKING.   celecoxib (CELEBREX) 200 MG capsule Take 1 capsule (200 mg total) by  mouth daily.   dexlansoprazole (DEXILANT) 60 MG capsule Take 1 capsule (60 mg total) by mouth daily.   diclofenac Sodium (VOLTAREN) 1 % GEL APPLY 4 GRAMS TOPICALLY 4 TIMES A DAY. (Patient taking differently: Apply 4 g topically 4 (four) times daily as needed (pain).)   DULoxetine (CYMBALTA) 60 MG capsule Take 1 capsule (60 mg total) by mouth at bedtime.   escitalopram (LEXAPRO) 20 MG tablet Take 1 tablet (20 mg total) by mouth daily.   estradiol (ESTRACE) 0.1 MG/GM vaginal cream Place 1 Applicatorful vaginally 3 (three) times a week.   FIBER PO Take 1 tablet by mouth in the morning and at bedtime.   fludrocortisone (FLORINEF) 0.1 MG tablet take 1 tablet ( total) by mouth every other day.   fluticasone (FLONASE) 50 MCG/ACT nasal spray USE 1 SPRAY IN EACH NOSTRIL ONCE DAILY. (Patient taking differently: Place 1 spray into both nostrils daily as needed for allergies.)   fluticasone furoate-vilanterol (BREO ELLIPTA) 100-25 MCG/ACT AEPB Inhale 1 puff into the lungs daily.   furosemide (LASIX) 40 MG tablet TAKE 1 TABLET DAILY AS NEEDED FOR EXCESSIVE FLUID.   Glucagon, rDNA, (GLUCAGON EMERGENCY) 1 MG KIT Inject 1 mg into the skin as needed.   glucose blood (ACCU-CHEK GUIDE) test strip TEST BLOOD SUGAR UP TO 4 TIMES A DAY AS DIRECTED. R73.03   hydrALAZINE (APRESOLINE) 25 MG tablet TAKE 1 TABLET BY MOUTH 3 TIMES DAILY AS NEEDED (FOR SEVERE HYPERTENSION/SYSTOLIC NUMBER 170 OR GREATER).   [START ON 11/22/2022] HYDROcodone-acetaminophen (NORCO) 7.5-325 MG tablet Take 1 tablet by mouth in the morning, at noon, and at bedtime.   [START ON 10/23/2022] HYDROcodone-acetaminophen (NORCO) 7.5-325 MG tablet Take 1 tablet by mouth in the morning, at noon, and at bedtime.   HYDROcodone-acetaminophen (NORCO) 7.5-325 MG tablet Take 1 tablet by mouth in the morning, at noon, and at bedtime.   HYDROcodone-acetaminophen (NORCO) 7.5-325 MG tablet take 1 tablet by mouth in the morning, at noon, and at bedtime.    ipratropium (ATROVENT) 0.02 % nebulizer solution USE 1 VIAL ( ) IN NEBULIZER EVERY 6 HOURS AS NEEDED FOR WHEEZING OR SHORTNESS OF BREATH.   isosorbide mononitrate (IMDUR) 60 MG 24 hr tablet Take 1 tablet (60 mg total) by mouth daily.   JARDIANCE 25 MG TABS tablet take 1 tablet (25 milligram total) by mouth daily before breakfast.   lamoTRIgine (LAMICTAL) 150 MG tablet TAKE 1 TABLET ONCE DAILY.   levETIRAcetam (KEPPRA) 500 MG tablet Take 1 tablet (500 mg total) by mouth 2 (two) times daily.   levocetirizine (XYZAL) 5 MG tablet TAKE 1 TABLET BY MOUTH EVERY MORNING.   lidocaine (LIDODERM) 5 % Place 1 patch onto the skin daily. Remove & Discard patch within 12 hours or as directed by MD   lidocaine (XYLOCAINE) 5 % ointment APPLY TO AFFECTED AREA 3 TIMES A DAY AS NEEDED  FOR MILD OR MODERATE PAIN.   nitroGLYCERIN (NITROSTAT) 0.4 MG SL tablet place one (1) tablet under tongue every 5 minutes up to (3) doses as needed for chest pain.   ondansetron (ZOFRAN ODT) 4 MG disintegrating tablet Take 1 tablet (4 mg total) by mouth every 8 (eight) hours as needed for nausea or vomiting.   ondansetron (ZOFRAN) 4 MG tablet take 1 tablet by mouth every 8 hours as needed for nausea and vomiting.   oxybutynin (DITROPAN-XL) 5 MG 24 hr tablet Take 1 tablet (5 mg total) by mouth at bedtime.   oxyCODONE (ROXICODONE) 5 MG immediate release tablet Take 1 tablet (5 mg total) by mouth every 4 (four) hours as needed for severe pain or breakthrough pain.   Probiotic Product (PROBIOTIC PO) Take 1 capsule by mouth daily.   rosuvastatin (CRESTOR) 10 MG tablet Take 1 tablet (10 mg total) by mouth daily.   traZODone (DESYREL) 150 MG tablet Take 1 tablet (150 mg total) by mouth at bedtime.   valACYclovir (VALTREX) 1000 MG tablet take 1 tablet (1,000 MILLIGRAM total) by mouth 3 (three) times daily.   valACYclovir (VALTREX) 500 MG tablet TAKE (1) TABLET TWICE DAILY.   Deutetrabenazine (AUSTEDO) 6 MG TABS Take 1 tablet by mouth 2 (two)  times daily. (Patient not taking: Reported on 10/08/2022)   tetrabenazine (XENAZINE) 12.5 MG tablet Take 1 tablet (12.5 mg total) by mouth daily. (Patient not taking: Reported on 10/08/2022)   [EXPIRED] betamethasone acetate-betamethasone sodium phosphate (CELESTONE) injection 6 mg    [EXPIRED] betamethasone acetate-betamethasone sodium phosphate (CELESTONE) injection 6 mg    [EXPIRED] lidocaine (XYLOCAINE) 1 % (with pres) injection 0.5 mL    [EXPIRED] lidocaine (XYLOCAINE) 1 % (with pres) injection 0.5 mL    No facility-administered encounter medications on file as of 10/08/2022.    Allergies (verified) Codeine, Morphine and codeine, Ambien [zolpidem tartrate], Clonidine derivatives, Metformin and related, Lyrica [pregabalin], and Neurontin [gabapentin]   History: Past Medical History:  Diagnosis Date   Allergy    Anemia    Anginal pain (HCC)    last time    Anxiety    Arthritis    RHEUMATOID   Asthma    Bipolar 1 disorder (HCC)    Blood transfusion without reported diagnosis    Cataracts, bilateral 07/2017   CHF (congestive heart failure) (HCC)    COPD (chronic obstructive pulmonary disease) (HCC)    Coronary artery disease    reported hx of "MI";  Echo 2009 with normal LVF;  Myoview 05/2011: no ischemia   Depression    Diabetes mellitus without complication (HCC)    Dyslipidemia    Dysrhythmia    SVT   Esophageal stricture    Fibromyalgia    GERD (gastroesophageal reflux disease)    H/O hiatal hernia    Head injury, unspecified    Headache    migraines   Herpes simplex infection    History of kidney stones    History of loop recorder    Managed by Dr. Sharrell Ku   Hyperlipidemia    Hypertension    Insomnia    Myocardial infarction [facility])    age 66   Osteoporosis    Pneumonia    hx   Seizures (HCC)    Shortness of breath    Sleep apnea    ? neg   Spinal stenosis of lumbar region    Spondylolisthesis    Status post placement of implantable loop recorder     Supraventricular tachycardia  Syncope and collapse    s/p ILR; no arhythmogenic cause identified   UTI (lower urinary tract infection)    Past Surgical History:  Procedure Laterality Date   BACK SURGERY     BREAST EXCISIONAL BIOPSY Right    1990s   BREAST SURGERY     lumpectomy   CARDIAC CATHETERIZATION  10/06/2011   CATARACT EXTRACTION W/PHACO Right 07/31/2017   Procedure: CATARACT EXTRACTION PHACO AND INTRAOCULAR LENS PLACEMENT (IOC);  Surgeon: Fabio Pierce, MD;  Location: [facility];  Service: Ophthalmology;  Laterality: Right;  CDE: 2.33   CATARACT EXTRACTION W/PHACO Left 08/14/2017   Procedure: CATARACT EXTRACTION PHACO AND INTRAOCULAR LENS PLACEMENT (IOC);  Surgeon: Fabio Pierce, MD;  Location: [facility];  Service: Ophthalmology;  Laterality: Left;  CDE: 2.74   CHOLECYSTECTOMY     CYSTOSCOPY     stone   DIAGNOSTIC LAPAROSCOPY     laparoscopic cholecystectomy   DOPPLER ECHOCARDIOGRAPHY  2009   ESOPHAGOGASTRODUODENOSCOPY N/A 10/31/2020   Procedure: ESOPHAGOGASTRODUODENOSCOPY (EGD);  Surgeon: Gaynelle Adu, MD;  Location: [facility];  Service: General;  Laterality: N/A;   EYE SURGERY Bilateral 08/14/2017   cataract removal   head up tilt table testing  06/15/2007   [name]   HEMORRHOID SURGERY     HERNIA REPAIR     insertion of implatable loop recorder  08/11/2007   [name]   POSTERIOR CERVICAL FUSION/FORAMINOTOMY N/A 12/19/2013   Procedure: RIGHT C3-4.C4-5 AND C5-6 FORAMINOTOMIES;  Surgeon: Kerrin Champagne, MD;  Location: [facility] OR;  Service: Orthopedics;  Laterality: N/A;   TOTAL HIP ARTHROPLASTY Left 01/31/2020   Procedure: LEFT TOTAL HIP ARTHROPLASTY ANTERIOR APPROACH;  Surgeon: Kathryne Hitch, MD;  Location: [facility] OR;  Service: Orthopedics;  Laterality: Left;   TOTAL SHOULDER ARTHROPLASTY Right 11/15/2019   Procedure: RIGHT TOTAL SHOULDER ARTHROPLASTY;  Surgeon: Cammy Copa, MD;  Location: [facility];  Service: Orthopedics;  Laterality: Right;   TOTAL SHOULDER  REVISION Right 06/11/2021   Procedure: RIGHT SHOULDER CONVERSION TOTAL SHOULDER ARTHROPLASTY to REVERSE TOTAL SHOULDER ARTHROPLASTY;  Surgeon: Cammy Copa, MD;  [facility] OR;  Service: Orthopedics;  Laterality: Right;   TUBAL LIGATION     UPPER GASTROINTESTINAL ENDOSCOPY     XI ROBOTIC ASSISTED HIATAL HERNIA REPAIR N/A 10/31/2020   Procedure: XI ROBOTIC ASSISTED HIATAL HERNIA REPAIR WITH PARTIAL FUNDOPLICATION;  Surgeon: Gaynelle Adu, MD;  Location: [facility];  Service: General;  Laterality: N/A;   Family History  Problem Relation Age of Onset   Heart attack Father    Mental illness Father    Mental illness Mother    Heart attack Brother        stents   Alcohol abuse Brother    Heart disease Brother    Drug abuse Brother    Diabetes Brother    Colon cancer Maternal Aunt    Cirrhosis Brother    Stomach cancer Neg Hx    Esophageal cancer Neg Hx    Rectal cancer Neg Hx    Social History   Socioeconomic History   Marital status: Single    Spouse name: Not on file   Number of children: 5   Years of education: Not on file   Highest education level: 5th grade  Occupational History   Occupation: Disability    Comment: 15 years  Tobacco Use   Smoking status: Never   Smokeless tobacco: Never  Vaping Use   Vaping Use: Never used  Substance and Sexual Activity   Alcohol use: No  Alcohol/week: 0.0 standard drinks of alcohol   Drug use: No   Sexual activity: Yes  Other Topics Concern   Not on file  Social History Narrative   Patient lives alone - 2 granddaughters live nearby - children are very involved   Patient is on disability.    Patient has 5th grade education.    Patient has 5 children, 24 grand-chilren, and 5 great-grand children.    Right handed    Social Determinants of Health   Financial Resource Strain: Low Risk  (10/08/2022)   Overall Financial Resource Strain (CARDIA)    Difficulty of Paying Living Expenses: Not hard at all  Food Insecurity: No Food  Insecurity (10/08/2022)   Hunger Vital Sign    Worried About Running Out of Food in the Last Year: Never true    Ran Out of Food in the Last Year: Never true  Transportation Needs: No Transportation Needs (10/08/2022)   PRAPARE - Administrator, Civil Service (Medical): No    Lack of Transportation (Non-Medical): No  Physical Activity: Insufficiently Active (10/08/2022)   Exercise Vital Sign    Days of Exercise per Week: 3 days    Minutes of Exercise per Session: 30 min  Stress: No Stress Concern Present (10/08/2022)   [employer] of Occupational Health - Occupational Stress Questionnaire    Feeling of Stress : Not at all  Social Connections: Moderately Isolated (10/08/2022)   Social Connection and Isolation Panel [NHANES]    Frequency of Communication with Friends and Family: More than three times a week    Frequency of Social Gatherings with Friends and Family: More than three times a week    Attends Religious Services: More than 4 times per year    Active Member of [employer] or Organizations: No    Attends Engineer, structural: Never    Marital Status: Divorced    Tobacco Counseling Counseling given: Not Answered   Clinical Intake:  Pre-visit preparation completed: Yes  Pain : No/denies pain     Nutritional Risks: None Diabetes: Yes CBG done?: No Did pt. bring in CBG monitor from home?: No  How often do you need to have someone help you when you read instructions, pamphlets, or other written materials from your doctor or pharmacy?: 1 - Never  Interpreter Needed?: No  Information entered by :: Renie Ora, LPN   Activities of Daily Living    10/08/2022    2:41 PM  In your present state of health, do you have any difficulty performing the following activities:  Hearing? 0  Vision? 0  Difficulty concentrating or making decisions? 0  Walking or climbing stairs? 0  Dressing or bathing? 0  Doing errands, shopping? 0  Preparing Food and eating  ? N  Using the Toilet? N  In the past six months, have you accidently leaked urine? N  Do you have problems with loss of bowel control? N  Managing your Medications? N  Managing your Finances? N  Housekeeping or managing your Housekeeping? N    Patient Care Team: Bennie Pierini, FNP as PCP - General (Nurse Practitioner) Antoine Poche, MD as PCP - Cardiology (Cardiology) Kerrin Champagne, MD (Inactive) as Consulting Physician (Orthopedic Surgery) Marinus Maw, MD as Consulting Physician (Cardiology) Kathryne Hitch, MD as Consulting Physician (Orthopedic Surgery)  Indicate any recent Medical Services you may have received from other than [facility] providers in the past year (date may be approximate).     Assessment:  This is a routine wellness examination for [facility]  Hearing/Vision screen Vision Screening - Comments:: Wears rx glasses - up to date with routine eye exams with  Dr.Johnson   Dietary issues and exercise activities discussed:     Goals Addressed             This Visit's Progress    Have 3 meals a day   On track      Depression Screen    10/08/2022    2:40 PM 09/23/2022    2:55 PM 06/26/2022    3:14 PM 03/27/2022   11:49 AM 11/26/2021   11:32 AM 09/09/2021    9:06 AM 07/01/2021    1:59 PM  PHQ 2/9 Scores  PHQ - 2 Score 0 0 1 0 0 0 0  PHQ- 9 Score 0 6 10 3 3   0    Fall Risk    10/08/2022    2:37 PM 09/23/2022    2:55 PM 06/26/2022    3:14 PM 03/27/2022   11:49 AM 11/26/2021   11:32 AM  Fall Risk   Falls in the past year? 1 0 1 1 1   Number falls in past yr: 1  1 1 1   Injury with Fall? 1  1 1  0  Risk for fall due to : History of fall(s);Impaired balance/gait;Orthopedic patient  History of fall(s) History of fall(s) History of fall(s)  Follow up Education provided;Falls prevention discussed;Falls evaluation completed  Education provided Education provided Education provided    MEDICARE RISK AT HOME:  Medicare Risk at Home - 10/08/22  1437     Any stairs in or around the home? Yes    If so, are there any without handrails? No    Home free of loose throw rugs in walkways, pet beds, electrical cords, etc? Yes    Adequate lighting in your home to reduce risk of falls? Yes    Life alert? No    Use of a cane, walker or w/c? No    Grab bars in the bathroom? Yes    Shower chair or bench in shower? Yes    Elevated toilet seat or a handicapped toilet? Yes             TIMED UP AND GO:  Was the test performed?  No    Cognitive Function:    06/30/2017   10:15 AM 08/20/2016    2:04 PM  MMSE - Mini Mental State Exam  Orientation to time 4 4  Orientation to Place 5 5  Registration 3 3  Attention/ Calculation 0 5  Recall 1 0  Language- name 2 objects 2 2  Language- repeat 1 1  Language- follow 3 step command 3 3  Language- read & follow direction 1 1  Write a sentence 1 1  Copy design 1 1  Total score 22 26        10/08/2022    2:41 PM 09/09/2021    9:12 AM 09/04/2020   12:22 PM 08/01/2019    9:22 AM 07/29/2018   10:56 AM  6CIT Screen  What Year? 0 points 0 points 0 points 0 points 0 points  What month? 0 points 0 points 0 points 0 points 0 points  What time? 0 points 0 points 0 points 0 points 0 points  Count back from 20 0 points 0 points 0 points 0 points 0 points  Months in reverse 0 points 4 points 4 points  4 points  Repeat phrase 0  points 0 points 6 points 4 points 2 points  Total Score 0 points 4 points 10 points  6 points    Immunizations Immunization History  Administered Date(s) Administered   Fluad Quad(high Dose 65+) 01/15/2022   Influenza Split 06/11/2013   Influenza Whole 12/19/2011   Influenza,inj,Quad PF,6+ Mos 01/25/2013, 01/05/2014, 02/12/2016, 05/07/2017, 03/16/2018, 01/19/2019, 01/26/2020, 01/02/2021   Moderna Sars-Covid-2 Vaccination 10/08/2019, 06/21/2020   PNEUMOCOCCAL CONJUGATE-20 11/26/2021   Pneumococcal Conjugate-13 01/05/2014   Pneumococcal Polysaccharide-23 11/30/2011,  01/31/2012   Tdap 08/05/2011   Zoster Recombinant(Shingrix) 07/01/2021, 11/26/2021   Zoster, Live 01/02/2012    TDAP status: Due, Education has been provided regarding the importance of this vaccine. Advised may receive this vaccine at local pharmacy or Health Dept. Aware to provide a copy of the vaccination record if obtained from local pharmacy or Health Dept. Verbalized acceptance and understanding.  Flu Vaccine status: Up to date  Pneumococcal vaccine status: Up to date  Covid-19 vaccine status: Completed vaccines  Qualifies for Shingles Vaccine? Yes   Zostavax completed Yes   Shingrix Completed?: Yes  Screening Tests Health Maintenance  Topic Date Due   DTaP/Tdap/Td (2 - Td or Tdap) 08/04/2021   COVID-19 Vaccine (3 - 2023-24 season) 11/29/2021   Colonoscopy  04/26/2022   MAMMOGRAM  10/03/2022   DEXA SCAN  09/23/2023 (Originally 08/25/2022)   INFLUENZA VACCINE  10/30/2022   PAP SMEAR-Modifier  09/13/2023   Medicare Annual Wellness (AWV)  10/08/2023   Pneumonia Vaccine 105+ Years old  Completed   Hepatitis C Screening  Completed   HIV Screening  Completed   Zoster Vaccines- Shingrix  Completed   HPV VACCINES  Aged Out    Health Maintenance  Health Maintenance Due  Topic Date Due   DTaP/Tdap/Td (2 - Td or Tdap) 08/04/2021   COVID-19 Vaccine (3 - 2023-24 season) 11/29/2021   Colonoscopy  04/26/2022   MAMMOGRAM  10/03/2022    Colorectal cancer screening: Referral to GI placed declined . Pt aware the office will call re: appt.  Mammogram status: Ordered declined . Pt provided with contact info and advised to call to schedule appt.   Bone Density status: Ordered  10/08/2022. Pt provided with contact info and advised to call to schedule appt.  Lung Cancer Screening: (Low Dose CT Chest recommended if Age 62-80 years, 20 pack-year currently smoking OR have quit w/in 15years.) does not qualify.   Lung Cancer Screening Referral: n/a  Additional Screening:  Hepatitis C  Screening: does not qualify; Completed 11/10/2014  Vision Screening: Recommended annual ophthalmology exams for early detection of glaucoma and other disorders of the eye. Is the patient up to date with their annual eye exam?  Yes  Who is the provider or what is the name of the office in which the patient attends annual eye exams? Dr. Laural Benes  If pt is not established with a provider, would they like to be referred to a provider to establish care? No .   Dental Screening: Recommended annual dental exams for proper oral hygiene    Community Resource Referral / Chronic Care Management: CRR required this visit?  No   CCM required this visit?  No     Plan:     I have personally reviewed and noted the following in the patient's chart:   Medical and social history Use of alcohol, tobacco or illicit drugs  Current medications and supplements including opioid prescriptions. Patient is not currently taking opioid prescriptions. Functional ability and status Nutritional status Physical activity Advanced  directives List of other physicians Hospitalizations, surgeries, and ER visits in previous 12 months Vitals Screenings to include cognitive, depression, and falls Referrals and appointments  In addition, I have reviewed and discussed with patient certain preventive protocols, quality metrics, and best practice recommendations. A written personalized care plan for preventive services as well as general preventive health recommendations were provided to patient.     Lorrene Reid, LPN   1/61/0960   After Visit Summary: (MyChart) Due to this being a telephonic visit, the after visit summary with patients personalized plan was offered to patient via MyChart   Nurse Notes: Due TDAP Vaccine

## 2022-10-08 NOTE — Patient Instructions (Addendum)
[name] , Thank you for taking time to come for your Medicare Wellness Visit. I appreciate your ongoing commitment to your health goals. Please review the following plan we discussed and let me know if I can assist you in the future.   These are the goals we discussed:  Goals      DIET - REDUCE SUGAR INTAKE     Exercise 3x per week (30 min per time)     Increase activity level as tolerated. Aim for 30 minutes of light walking 3 times a week. Walk with a partner.   Do chair exercises daily. Refer to handouts.      Have 3 meals a day     Have 3 meals a day     Eat 3 meals daily that consist protein, fruits and vegetables      Have 3 meals a day     Eat 3 meals daily that consist of proteins, fruits and vegetables     Patient Stated     08/01/2019 AWV Goal: Exercise for General Health  Patient will verbalize understanding of the benefits of increased physical activity: Exercising regularly is important. It will improve your overall fitness, flexibility, and endurance. Regular exercise also will improve your overall health. It can help you control your weight, reduce stress, and improve your bone density. Over the next year, patient will increase physical activity as tolerated with a goal of at least 150 minutes of moderate physical activity per week.  You can tell that you are exercising at a moderate intensity if your heart starts beating faster and you start breathing faster but can still hold a conversation. Moderate-intensity exercise ideas include: Walking 1 mile (1.6 km) in about 15 minutes Biking Hiking Golfing Dancing Water aerobics Patient will verbalize understanding of everyday activities that increase physical activity by providing examples like the following: Yard work, such as: Insurance underwriter Gardening Washing windows or floors Patient will be able to explain general safety  guidelines for exercising:  Before you start a new exercise program, talk with your health care provider. Do not exercise so much that you hurt yourself, feel dizzy, or get very short of breath. Wear comfortable clothes and wear shoes with good support. Drink plenty of water while you exercise to prevent dehydration or heat stroke. Work out until your breathing and your heartbeat get faster.         This is a list of the screening recommended for you and due dates:  Health Maintenance  Topic Date Due   DTaP/Tdap/Td vaccine (2 - Td or Tdap) 08/04/2021   COVID-19 Vaccine (3 - 2023-24 season) 11/29/2021   Colon Cancer Screening  04/26/2022   Mammogram  10/03/2022   DEXA scan (bone density measurement)  09/23/2023*   Flu Shot  10/30/2022   Pap Smear  09/13/2023   Medicare Annual Wellness Visit  10/08/2023   Pneumonia Vaccine  Completed   Hepatitis C Screening  Completed   HIV Screening  Completed   Zoster (Shingles) Vaccine  Completed   HPV Vaccine  Aged Out  *Topic was postponed. The date shown is not the original due date.    Advanced directives: Advance directive discussed with you today. I have provided a copy for you to complete at home and have notarized. Once this is complete please bring a copy in to our office so we can scan it into your chart. Information  on Advanced Care Planning can be found at [facility] Advance Health Care Directives Advance Health Care Directives ([email or web address]    Conditions/risks identified:   Next appointment: Follow up in one year for your annual wellness visit    Preventive Care 65 Years and Older, Female Preventive care refers to lifestyle choices and visits with your health care provider that can promote health and wellness. What does preventive care include? A yearly physical exam. This is also called an annual well check. Dental exams once or twice a year. Routine eye exams. Ask your health care provider how often you  should have your eyes checked. Personal lifestyle choices, including: Daily care of your teeth and gums. Regular physical activity. Eating a healthy diet. Avoiding tobacco and drug use. Limiting alcohol use. Practicing safe sex. Taking low-dose aspirin every day. Taking vitamin and mineral supplements as recommended by your health care provider. What happens during an annual well check? The services and screenings done by your health care provider during your annual well check will depend on your age, overall health, lifestyle risk factors, and family history of disease. Counseling  Your health care provider may ask you questions about your: Alcohol use. Tobacco use. Drug use. Emotional well-being. Home and relationship well-being. Sexual activity. Eating habits. History of falls. Memory and ability to understand (cognition). Work and work Astronomer. Reproductive health. Screening  You may have the following tests or measurements: Height, weight, and BMI. Blood pressure. Lipid and cholesterol levels. These may be checked every 5 years, or more frequently if you are over 39 years old. Skin check. Lung cancer screening. You may have this screening every year starting at age 25 if you have a 30-pack-year history of smoking and currently smoke or have quit within the past 15 years. Fecal occult blood test (FOBT) of the stool. You may have this test every year starting at age 45. Flexible sigmoidoscopy or colonoscopy. You may have a sigmoidoscopy every 5 years or a colonoscopy every 10 years starting at age 2. Hepatitis C blood test. Hepatitis B blood test. Sexually transmitted disease (STD) testing. Diabetes screening. This is done by checking your blood sugar (glucose) after you have not eaten for a while (fasting). You may have this done every 1-3 years. Bone density scan. This is done to screen for osteoporosis. You may have this done starting at age 59. Mammogram. This may  be done every 1-2 years. Talk to your health care provider about how often you should have regular mammograms. Talk with your health care provider about your test results, treatment options, and if necessary, the need for more tests. Vaccines  Your health care provider may recommend certain vaccines, such as: Influenza vaccine. This is recommended every year. Tetanus, diphtheria, and acellular pertussis (Tdap, Td) vaccine. You may need a Td booster every 10 years. Zoster vaccine. You may need this after age 61. Pneumococcal 13-valent conjugate (PCV13) vaccine. One dose is recommended after age 100. Pneumococcal polysaccharide (PPSV23) vaccine. One dose is recommended after age 69. Talk to your health care provider about which screenings and vaccines you need and how often you need them. This information is not intended to replace advice given to you by your health care provider. Make sure you discuss any questions you have with your health care provider. Document Released: 04/13/2015 Document Revised: 12/05/2015 Document Reviewed: 01/16/2015 Elsevier Interactive Patient Education  2017 [employer].  Fall Prevention in the Home Falls can cause injuries. They can happen to  people of all ages. There are many things you can do to make your home safe and to help prevent falls. What can I do on the outside of my home? Regularly fix the edges of walkways and driveways and fix any cracks. Remove anything that might make you trip as you walk through a door, such as a raised step or threshold. Trim any bushes or trees on the path to your home. Use bright outdoor lighting. Clear any walking paths of anything that might make someone trip, such as rocks or tools. Regularly check to see if handrails are loose or broken. Make sure that both sides of any steps have handrails. Any raised decks and porches should have guardrails on the edges. Have any leaves, snow, or ice cleared regularly. Use sand or salt  on walking paths during winter. Clean up any spills in your garage right away. This includes oil or grease spills. What can I do in the bathroom? Use night lights. Install grab bars by the toilet and in the tub and shower. Do not use towel bars as grab bars. Use non-skid mats or decals in the tub or shower. If you need to sit down in the shower, use a plastic, non-slip stool. Keep the floor dry. Clean up any water that spills on the floor as soon as it happens. Remove soap buildup in the tub or shower regularly. Attach bath mats securely with double-sided non-slip rug tape. Do not have throw rugs and other things on the floor that can make you trip. What can I do in the bedroom? Use night lights. Make sure that you have a light by your bed that is easy to reach. Do not use any sheets or blankets that are too big for your bed. They should not hang down onto the floor. Have a firm chair that has side arms. You can use this for support while you get dressed. Do not have throw rugs and other things on the floor that can make you trip. What can I do in the kitchen? Clean up any spills right away. Avoid walking on wet floors. Keep items that you use a lot in easy-to-reach places. If you need to reach something above you, use a strong step stool that has a grab bar. Keep electrical cords out of the way. Do not use floor polish or wax that makes floors slippery. If you must use wax, use non-skid floor wax. Do not have throw rugs and other things on the floor that can make you trip. What can I do with my stairs? Do not leave any items on the stairs. Make sure that there are handrails on both sides of the stairs and use them. Fix handrails that are broken or loose. Make sure that handrails are as long as the stairways. Check any carpeting to make sure that it is firmly attached to the stairs. Fix any carpet that is loose or worn. Avoid having throw rugs at the top or bottom of the stairs. If you  do have throw rugs, attach them to the floor with carpet tape. Make sure that you have a light switch at the top of the stairs and the bottom of the stairs. If you do not have them, ask someone to add them for you. What else can I do to help prevent falls? Wear shoes that: Do not have high heels. Have rubber bottoms. Are comfortable and fit you well. Are closed at the toe. Do not wear sandals. If you  use a stepladder: Make sure that it is fully opened. Do not climb a closed stepladder. Make sure that both sides of the stepladder are locked into place. Ask someone to hold it for you, if possible. Clearly mark and make sure that you can see: Any grab bars or handrails. First and last steps. Where the edge of each step is. Use tools that help you move around (mobility aids) if they are needed. These include: Canes. Walkers. Scooters. Crutches. Turn on the lights when you go into a dark area. Replace any light bulbs as soon as they burn out. Set up your furniture so you have a clear path. Avoid moving your furniture around. If any of your floors are uneven, fix them. If there are any pets around you, be aware of where they are. Review your medicines with your doctor. Some medicines can make you feel dizzy. This can increase your chance of falling. Ask your doctor what other things that you can do to help prevent falls. This information is not intended to replace advice given to you by your health care provider. Make sure you discuss any questions you have with your health care provider. Document Released: 01/11/2009 Document Revised: 08/23/2015 Document Reviewed: 04/21/2014 Elsevier Interactive Patient Education  2017 [employer].

## 2022-10-09 ENCOUNTER — Ambulatory Visit (INDEPENDENT_AMBULATORY_CARE_PROVIDER_SITE_OTHER): Payer: 59

## 2022-10-09 DIAGNOSIS — Z780 Asymptomatic menopausal state: Secondary | ICD-10-CM | POA: Diagnosis not present

## 2022-10-17 ENCOUNTER — Other Ambulatory Visit: Payer: Self-pay | Admitting: Nurse Practitioner

## 2022-10-28 ENCOUNTER — Other Ambulatory Visit: Payer: Self-pay | Admitting: Cardiology

## 2022-10-28 ENCOUNTER — Other Ambulatory Visit: Payer: Self-pay | Admitting: Nurse Practitioner

## 2022-10-28 DIAGNOSIS — F411 Generalized anxiety disorder: Secondary | ICD-10-CM

## 2022-10-28 DIAGNOSIS — B009 Herpesviral infection, unspecified: Secondary | ICD-10-CM

## 2022-10-28 DIAGNOSIS — R7303 Prediabetes: Secondary | ICD-10-CM

## 2022-10-28 DIAGNOSIS — I10 Essential (primary) hypertension: Secondary | ICD-10-CM

## 2022-10-30 ENCOUNTER — Ambulatory Visit (HOSPITAL_BASED_OUTPATIENT_CLINIC_OR_DEPARTMENT_OTHER): Payer: 59 | Admitting: Physical Therapy

## 2022-10-31 ENCOUNTER — Other Ambulatory Visit: Payer: Self-pay

## 2022-10-31 ENCOUNTER — Ambulatory Visit (HOSPITAL_BASED_OUTPATIENT_CLINIC_OR_DEPARTMENT_OTHER): Payer: 59 | Admitting: Physical Therapy

## 2022-10-31 MED ORDER — CELECOXIB 200 MG PO CAPS: 200.0000 mg | ORAL_CAPSULE | Freq: Every day | ORAL | 1 refills | Status: DC

## 2022-11-03 ENCOUNTER — Other Ambulatory Visit: Payer: Self-pay

## 2022-11-03 ENCOUNTER — Encounter (HOSPITAL_COMMUNITY): Payer: Self-pay | Admitting: Orthopaedic Surgery

## 2022-11-03 NOTE — Progress Notes (Addendum)
PCP - [name] Daphine Deutscher, FNP Cardiologist - Dr Dina Rich EP - Dr [name]  Chest x-ray - n/a EKG - 09/22/22 Stress Test - 10/05/17 ECHO - 11/26/20 Cardiac Cath - 10/06/11  Hx of Loop Recorder (08/10/17)  Sleep Study -  Yes, mild, does not require positive pressure treatment. (02/03/22)  Diabetes Type 2 Last dose of Jardiance was on 10/26/22 per patient on 11/03/22.  If your blood sugar is less than 70 mg/dL, you will need to treat for low blood sugar: Treat a low blood sugar (less than 70 mg/dL) with  cup of clear juice (cranberry or apple), 4 glucose tablets, OR glucose gel. Recheck blood sugar in 15 minutes after treatment (to make sure it is greater than 70 mg/dL). If your blood sugar is not greater than 70 mg/dL on recheck, call [phone] for further instructions.  Aspirin Instructions: Last dose of ASA was more than a month ago per patient on 11/03/22.[name]   ERAS: Clear liquids til 12 Noon DOS.  Anesthesia review: Yes  STOP now taking any Aspirin (unless otherwise instructed by your surgeon), Aleve, Celebrex, Voltaren, Naproxen, Ibuprofen, Motrin, Advil, Goody's, BC's, all herbal medications, fish oil, and all vitamins.   Coronavirus Screening Do you have any of the following symptoms:  Cough yes/no: No Fever (>100.66F)  yes/no: No Runny nose yes/no: No Sore throat yes/no: No Difficulty breathing/shortness of breath  yes/no: No  Have you traveled in the last 14 days and where? yes/no: No  Patient verbalized understanding of instructions that were given via phone.

## 2022-11-03 NOTE — Anesthesia Preprocedure Evaluation (Signed)
Anesthesia Evaluation  Patient identified by MRN, date of birth, ID band Patient awake    Reviewed: Allergy & Precautions, NPO status , Patient's Chart, lab work & pertinent test results, reviewed documented beta blocker date and time   History of Anesthesia Complications Negative for: history of anesthetic complications  Airway Mallampati: III  TM Distance: >3 FB Neck ROM: Full    Dental  (+) Edentulous Upper, Edentulous Lower, Dental Advisory Given   Pulmonary neg shortness of breath, asthma , sleep apnea (does not use CPAP) , COPD (no recent flares), neg recent URI   Pulmonary exam normal breath sounds clear to auscultation       Cardiovascular hypertension, Pt. on medications and Pt. on home beta blockers + angina  + CAD, + Past MI (age 2) and +CHF  (-) Cardiac Stents and (-) CABG + dysrhythmias Supra Ventricular Tachycardia  Rhythm:Regular Rate:Normal     Neuro/Psych  Headaches, Seizures - (last seizure >2 years ago), Well Controlled,  PSYCHIATRIC DISORDERS Anxiety Depression Bipolar Disorder      GI/Hepatic Neg liver ROS, hiatal hernia,GERD  ,,  Endo/Other  diabetes, Type 2    Renal/GU negative Renal ROS  negative genitourinary   Musculoskeletal  (+) Arthritis ,  Fibromyalgia -LEFT GLUTEUS MEDIUS TEAR   Abdominal   Peds  Hematology  (+) Blood dyscrasia, anemia Lab Results      Component                Value               Date                      WBC                      5.0                 11/04/2022                HGB                      13.9                11/04/2022                HCT                      42.2                11/04/2022                MCV                      97.9                11/04/2022                PLT                      142 (L)             11/04/2022              Anesthesia Other Findings Day of surgery medications reviewed with patient.  Reproductive/Obstetrics negative OB  ROS  Anesthesia Physical Anesthesia Plan  ASA: 3  Anesthesia Plan: General   Post-op Pain Management: Tylenol PO (pre-op)*   Induction: Intravenous  PONV Risk Score and Plan: 3 and Treatment may vary due to age or medical condition, Midazolam, Dexamethasone and Ondansetron  Airway Management Planned: Oral ETT  Additional Equipment: None  Intra-op Plan:   Post-operative Plan: Extubation in OR  Informed Consent: I have reviewed the patients History and Physical, chart, labs and discussed the procedure including the risks, benefits and alternatives for the proposed anesthesia with the patient or authorized representative who has indicated his/her understanding and acceptance.     Dental advisory given  Plan Discussed with: Anesthesiologist  Anesthesia Plan Comments: (Risks of general anesthesia discussed including, but not limited to, sore throat, hoarse voice, chipped/damaged teeth, injury to vocal cords, nausea and vomiting, allergic reactions, lung infection, heart attack, stroke, and death. All questions answered.   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 She also has a history of longstanding recurrent chest pain and has had numerous negative work-ups. Most recently, she had a Lexiscan 09/2017 that was negative. She had a cath in 2013 with patent vessels. Her symptoms did improve with Imdur.   Last seen by cardiologist Dr. Dina Rich on 09/22/2022, stable at that time.  Upcoming surgery discussed.  Per note, "Preop evaluation - considering left gluteus repair under general anesthesia - tolerates greater 4  METs regularly without symptoms."  Hx of large hiatal hernia s/p repair 10/2020.  COPD, maintained on Breo Ellipta and as needed albuterol.  Hx of seizures, maintained on Keppra.  Non-insulin-dependent DM2, A1c 5.7 on 09/23/2022.  Patient will need day of surgery labs and evaluation.  EKG 09/22/22: NSR. Rate 63. LAFB. Moderate voltage criteria for LVH.  TTE 11/26/20: 1. Left ventricular ejection fraction, by estimation, is 60 to 65%. The  left ventricle has normal function. The left ventricle has no regional  wall motion abnormalities. Left ventricular diastolic parameters are  indeterminate.  2. Right ventricular systolic function is normal. The right ventricular  size is normal. Tricuspid regurgitation signal is inadequate for assessing  PA pressure.  3. The mitral valve is grossly normal. Mild mitral valve regurgitation.  4. The aortic valve is tricuspid. There is mild calcification of the  aortic valve. Aortic valve regurgitation is mild. No aortic stenosis is  present.  5. The inferior vena cava is normal in size with greater than 50%  respiratory variability, suggesting right atrial pressure of 3 mmHg.   Comparison(s): Prior images reviewed side by side. Aortic regurgitation is  overall mild.   Nuclear stress 10/05/17:  There was no ST segment deviation noted during stress. Nonspecific T wave flattening in aVL and V2 seen throughout study.  Defect 1: There is a medium defect of mild severity present in the mid inferior, apical inferior and apical lateral location. This appears to be due to soft tissue attenuation given normal regional wall motion. No ischemic territories.  This is a low risk study.  Nuclear stress EF: 65%.   )        Anesthesia Quick Evaluation

## 2022-11-03 NOTE — Progress Notes (Signed)
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 She also has a history of longstanding recurrent chest pain and has had numerous negative work-ups. Most recently, she had a Lexiscan 09/2017 that was negative. She had a cath in 2013 with patent vessels. Her symptoms did improve with Imdur.   Last seen by cardiologist Dr. Dina Rich on 09/22/2022, stable at that time.  Upcoming surgery discussed.  Per note, "Preop evaluation - considering left gluteus repair under general anesthesia - tolerates greater 4 METs regularly without symptoms."   Hx of large hiatal hernia s/p repair 10/2020.  COPD, maintained on Breo Ellipta and as needed albuterol.  Hx of seizures, maintained on Keppra.  Non-insulin-dependent DM2, A1c 5.7 on 09/23/2022.  Patient will need day of surgery labs and evaluation.  EKG 09/22/22: NSR. Rate 63. LAFB. Moderate voltage criteria for LVH.  TTE 11/26/20:  1. Left ventricular ejection fraction, by estimation, is 60 to 65%. The  left ventricle has normal function. The left ventricle has no regional  wall motion abnormalities. Left ventricular diastolic parameters are  indeterminate.   2. Right ventricular systolic function is normal. The right ventricular  size is normal. Tricuspid regurgitation signal is inadequate for assessing  PA pressure.   3. The mitral valve is grossly normal. Mild mitral valve regurgitation.   4. The aortic valve is tricuspid. There is mild calcification of the  aortic valve. Aortic valve regurgitation is mild. No aortic stenosis is  present.   5. The inferior vena cava is normal in size with greater than 50%  respiratory  variability, suggesting right atrial pressure of 3 mmHg.   Comparison(s): Prior images reviewed side by side. Aortic regurgitation is  overall mild.   Nuclear stress 10/05/17: There was no ST segment deviation noted during stress. Nonspecific T wave flattening in aVL and V2 seen throughout study. Defect 1: There is a medium defect of mild severity present in the mid inferior, apical inferior and apical lateral location. This appears to be due to soft tissue attenuation given normal regional wall motion. No ischemic territories. This is a low risk study. Nuclear stress EF: 65%.     Zannie Cove [facility]/Anesthesiology Phone [phone] 11/03/2022 9:53 AM\

## 2022-11-04 ENCOUNTER — Other Ambulatory Visit: Payer: Self-pay

## 2022-11-04 ENCOUNTER — Ambulatory Visit (HOSPITAL_COMMUNITY): Payer: Self-pay | Admitting: Physician Assistant

## 2022-11-04 ENCOUNTER — Encounter (HOSPITAL_COMMUNITY): Payer: Self-pay | Admitting: Orthopaedic Surgery

## 2022-11-04 ENCOUNTER — Encounter (HOSPITAL_COMMUNITY)
Admission: RE | Disposition: A | Discharge status: Home / Self Care | Payer: Self-pay | Source: Home / Self Care | Attending: Orthopaedic Surgery

## 2022-11-04 ENCOUNTER — Ambulatory Visit (HOSPITAL_COMMUNITY)
Admission: RE | Admit: 2022-11-04 | Discharge: 2022-11-04 | Disposition: A | Discharge status: Home / Self Care | Payer: 59 | Attending: Orthopaedic Surgery | Admitting: Orthopaedic Surgery

## 2022-11-04 DIAGNOSIS — F319 Bipolar disorder, unspecified: Secondary | ICD-10-CM | POA: Insufficient documentation

## 2022-11-04 DIAGNOSIS — I252 Old myocardial infarction: Secondary | ICD-10-CM | POA: Diagnosis not present

## 2022-11-04 DIAGNOSIS — R569 Unspecified convulsions: Secondary | ICD-10-CM | POA: Insufficient documentation

## 2022-11-04 DIAGNOSIS — I471 Supraventricular tachycardia, unspecified: Secondary | ICD-10-CM | POA: Diagnosis not present

## 2022-11-04 DIAGNOSIS — K219 Gastro-esophageal reflux disease without esophagitis: Secondary | ICD-10-CM | POA: Diagnosis not present

## 2022-11-04 DIAGNOSIS — Z79891 Long term (current) use of opiate analgesic: Secondary | ICD-10-CM | POA: Diagnosis not present

## 2022-11-04 DIAGNOSIS — F419 Anxiety disorder, unspecified: Secondary | ICD-10-CM | POA: Diagnosis not present

## 2022-11-04 DIAGNOSIS — S76012A Strain of muscle, fascia and tendon of left hip, initial encounter: Secondary | ICD-10-CM | POA: Diagnosis not present

## 2022-11-04 DIAGNOSIS — E119 Type 2 diabetes mellitus without complications: Secondary | ICD-10-CM | POA: Diagnosis not present

## 2022-11-04 DIAGNOSIS — G43909 Migraine, unspecified, not intractable, without status migrainosus: Secondary | ICD-10-CM | POA: Insufficient documentation

## 2022-11-04 DIAGNOSIS — J4489 Other specified chronic obstructive pulmonary disease: Secondary | ICD-10-CM | POA: Insufficient documentation

## 2022-11-04 DIAGNOSIS — G4730 Sleep apnea, unspecified: Secondary | ICD-10-CM | POA: Insufficient documentation

## 2022-11-04 DIAGNOSIS — Z79899 Other long term (current) drug therapy: Secondary | ICD-10-CM | POA: Diagnosis not present

## 2022-11-04 DIAGNOSIS — I110 Hypertensive heart disease with heart failure: Secondary | ICD-10-CM | POA: Insufficient documentation

## 2022-11-04 DIAGNOSIS — I10 Essential (primary) hypertension: Secondary | ICD-10-CM | POA: Insufficient documentation

## 2022-11-04 DIAGNOSIS — S76012D Strain of muscle, fascia and tendon of left hip, subsequent encounter: Secondary | ICD-10-CM

## 2022-11-04 DIAGNOSIS — I25119 Atherosclerotic heart disease of native coronary artery with unspecified angina pectoris: Secondary | ICD-10-CM | POA: Diagnosis not present

## 2022-11-04 DIAGNOSIS — M797 Fibromyalgia: Secondary | ICD-10-CM | POA: Insufficient documentation

## 2022-11-04 DIAGNOSIS — R55 Syncope and collapse: Secondary | ICD-10-CM | POA: Insufficient documentation

## 2022-11-04 DIAGNOSIS — I509 Heart failure, unspecified: Secondary | ICD-10-CM | POA: Diagnosis not present

## 2022-11-04 DIAGNOSIS — M1990 Unspecified osteoarthritis, unspecified site: Secondary | ICD-10-CM | POA: Insufficient documentation

## 2022-11-04 DIAGNOSIS — M7062 Trochanteric bursitis, left hip: Secondary | ICD-10-CM | POA: Diagnosis not present

## 2022-11-04 HISTORY — PX: GLUTEUS MINIMUS REPAIR: SHX5843

## 2022-11-04 LAB — COMPREHENSIVE METABOLIC PANEL
ALT: 29 U/L (ref 0–44)
AST: 21 U/L (ref 15–41)
Albumin: 3.3 g/dL — ABNORMAL LOW (ref 3.5–5.0)
Alkaline Phosphatase: 90 U/L (ref 38–126)
Anion gap: 14 (ref 5–15)
BUN: 13 mg/dL (ref 8–23)
CO2: 22 mmol/L (ref 22–32)
Calcium: 9.2 mg/dL (ref 8.9–10.3)
Chloride: 107 mmol/L (ref 98–111)
Creatinine, Ser: 0.81 mg/dL (ref 0.44–1.00)
GFR, Estimated: >60 mL/min (ref >60)
Glucose, Bld: 51 mg/dL — ABNORMAL LOW (ref 70–99)
Potassium: 3.1 mmol/L — ABNORMAL LOW (ref 3.5–5.1)
Sodium: 143 mmol/L (ref 135–145)
Total Bilirubin: 0.6 mg/dL (ref 0.3–1.2)
Total Protein: 6.1 g/dL — ABNORMAL LOW (ref 6.5–8.1)

## 2022-11-04 LAB — GLUCOSE, CAPILLARY
Glucose-Capillary: 43 mg/dL — CL (ref 70–99)
Glucose-Capillary: 186 mg/dL — ABNORMAL HIGH (ref 70–99)
Glucose-Capillary: 117 mg/dL — ABNORMAL HIGH (ref 70–99)
Glucose-Capillary: 92 mg/dL (ref 70–99)
Glucose-Capillary: 89 mg/dL (ref 70–99)

## 2022-11-04 LAB — CBC
HCT: 42.2 % (ref 36.0–46.0)
Hemoglobin: 13.9 g/dL (ref 12.0–15.0)
MCH: 32.3 pg (ref 26.0–34.0)
MCHC: 32.9 g/dL (ref 30.0–36.0)
MCV: 97.9 fL (ref 80.0–100.0)
Platelets: 142 10*3/uL — ABNORMAL LOW (ref 150–400)
RBC: 4.31 MIL/uL (ref 3.87–5.11)
RDW: 12.2 % (ref 11.5–15.5)
WBC: 5 10*3/uL (ref 4.0–10.5)
nRBC: 0 % (ref 0.0–0.2)

## 2022-11-04 SURGERY — REPAIR, TENDON, GLUTEUS MINIMUS
Anesthesia: General | Site: Buttocks | Laterality: Left

## 2022-11-04 MED ORDER — DEXTROSE 50 % IV SOLN
25.0000 g | INTRAVENOUS | Status: AC
  Filled 2022-11-04: qty 50

## 2022-11-04 MED ORDER — OXYCODONE HCL 5 MG PO TABS
ORAL_TABLET | ORAL | Status: AC
  Filled 2022-11-04: qty 1

## 2022-11-04 MED ORDER — KETOROLAC TROMETHAMINE 15 MG/ML IJ SOLN
INTRAMUSCULAR | Status: AC
  Filled 2022-11-04: qty 1

## 2022-11-04 MED ORDER — ACETAMINOPHEN 500 MG PO TABS
1000.0000 mg | ORAL_TABLET | Freq: Once | ORAL | Status: AC
  Administered 2022-11-04: 1000 mg via ORAL
  Filled 2022-11-04: qty 2

## 2022-11-04 MED ORDER — KETOROLAC TROMETHAMINE 15 MG/ML IJ SOLN
15.0000 mg | Freq: Once | INTRAMUSCULAR | Status: AC
  Administered 2022-11-04: 15 mg via INTRAVENOUS

## 2022-11-04 MED ORDER — ROCURONIUM BROMIDE 10 MG/ML (PF) SYRINGE
PREFILLED_SYRINGE | INTRAVENOUS | Status: AC
  Filled 2022-11-04: qty 10

## 2022-11-04 MED ORDER — HYDROMORPHONE HCL 1 MG/ML IJ SOLN
INTRAMUSCULAR | Status: AC
  Filled 2022-11-04: qty 1

## 2022-11-04 MED ORDER — ONDANSETRON HCL 4 MG/2ML IJ SOLN
INTRAMUSCULAR | Status: DC | PRN
Start: 2022-11-04 — End: 2022-11-04
  Administered 2022-11-04: 4 mg via INTRAVENOUS

## 2022-11-04 MED ORDER — AMISULPRIDE (ANTIEMETIC) 5 MG/2ML IV SOLN: 10.0000 mg | Freq: Once | INTRAVENOUS | Status: DC | PRN

## 2022-11-04 MED ORDER — SUGAMMADEX SODIUM 200 MG/2ML IV SOLN
INTRAVENOUS | Status: DC | PRN
  Administered 2022-11-04: 150 mg via INTRAVENOUS

## 2022-11-04 MED ORDER — BUPIVACAINE HCL 0.25 % IJ SOLN
INTRAMUSCULAR | Status: DC | PRN
Start: 2022-11-04 — End: 2022-11-04
  Administered 2022-11-04: 10 mL

## 2022-11-04 MED ORDER — CEFAZOLIN SODIUM-DEXTROSE 2-4 GM/100ML-% IV SOLN
2.0000 g | INTRAVENOUS | Status: AC
  Administered 2022-11-04: 2 g via INTRAVENOUS
  Filled 2022-11-04: qty 100

## 2022-11-04 MED ORDER — PROPOFOL 10 MG/ML IV BOLUS
INTRAVENOUS | Status: DC | PRN
Start: 2022-11-04 — End: 2022-11-04
  Administered 2022-11-04: 100 mg via INTRAVENOUS
  Administered 2022-11-04: 30 mg via INTRAVENOUS

## 2022-11-04 MED ORDER — BUPIVACAINE HCL (PF) 0.25 % IJ SOLN
INTRAMUSCULAR | Status: AC
  Filled 2022-11-04: qty 30

## 2022-11-04 MED ORDER — ORAL CARE MOUTH RINSE: 15.0000 mL | Freq: Once | OROMUCOSAL | Status: AC

## 2022-11-04 MED ORDER — DEXAMETHASONE SODIUM PHOSPHATE 10 MG/ML IJ SOLN
INTRAMUSCULAR | Status: AC
  Filled 2022-11-04: qty 1

## 2022-11-04 MED ORDER — LACTATED RINGERS IV SOLN: INTRAVENOUS | Status: DC

## 2022-11-04 MED ORDER — EPHEDRINE 5 MG/ML INJ
INTRAVENOUS | Status: AC
  Filled 2022-11-04: qty 5

## 2022-11-04 MED ORDER — EPHEDRINE SULFATE-NACL 50-0.9 MG/10ML-% IV SOSY
PREFILLED_SYRINGE | INTRAVENOUS | Status: DC | PRN
  Administered 2022-11-04: 5 mg via INTRAVENOUS
  Administered 2022-11-04 (×2): 10 mg via INTRAVENOUS

## 2022-11-04 MED ORDER — ONDANSETRON HCL 4 MG/2ML IJ SOLN
INTRAMUSCULAR | Status: AC
  Filled 2022-11-04: qty 2

## 2022-11-04 MED ORDER — DEXTROSE 50 % IV SOLN
INTRAVENOUS | Status: AC
  Administered 2022-11-04: 25 g via INTRAVENOUS
  Filled 2022-11-04: qty 50

## 2022-11-04 MED ORDER — ACETAMINOPHEN 500 MG PO TABS: 1000.0000 mg | ORAL_TABLET | Freq: Once | ORAL | Status: DC

## 2022-11-04 MED ORDER — MIDAZOLAM HCL 2 MG/2ML IJ SOLN
INTRAMUSCULAR | Status: AC
  Filled 2022-11-04: qty 2

## 2022-11-04 MED ORDER — LIDOCAINE 2% (20 MG/ML) 5 ML SYRINGE
INTRAMUSCULAR | Status: DC | PRN
  Administered 2022-11-04: 60 mg via INTRAVENOUS

## 2022-11-04 MED ORDER — ROCURONIUM BROMIDE 10 MG/ML (PF) SYRINGE
PREFILLED_SYRINGE | INTRAVENOUS | Status: DC | PRN
  Administered 2022-11-04: 40 mg via INTRAVENOUS

## 2022-11-04 MED ORDER — FENTANYL CITRATE (PF) 250 MCG/5ML IJ SOLN
INTRAMUSCULAR | Status: DC | PRN
  Administered 2022-11-04: 50 ug via INTRAVENOUS

## 2022-11-04 MED ORDER — PHENYLEPHRINE 80 MCG/ML (10ML) SYRINGE FOR IV PUSH (FOR BLOOD PRESSURE SUPPORT)
PREFILLED_SYRINGE | INTRAVENOUS | Status: DC | PRN
  Administered 2022-11-04: 80 ug via INTRAVENOUS

## 2022-11-04 MED ORDER — FENTANYL CITRATE (PF) 250 MCG/5ML IJ SOLN
INTRAMUSCULAR | Status: AC
  Filled 2022-11-04: qty 5

## 2022-11-04 MED ORDER — CHLORHEXIDINE GLUCONATE 0.12 % MT SOLN
15.0000 mL | Freq: Once | OROMUCOSAL | Status: AC
  Administered 2022-11-04: 15 mL via OROMUCOSAL
  Filled 2022-11-04: qty 15

## 2022-11-04 MED ORDER — 0.9 % SODIUM CHLORIDE (POUR BTL) OPTIME
TOPICAL | Status: DC | PRN
  Administered 2022-11-04: 1000 mL

## 2022-11-04 MED ORDER — LIDOCAINE 2% (20 MG/ML) 5 ML SYRINGE
INTRAMUSCULAR | Status: AC
  Filled 2022-11-04: qty 5

## 2022-11-04 MED ORDER — HYDROMORPHONE HCL 1 MG/ML IJ SOLN
0.2500 mg | INTRAMUSCULAR | Status: DC | PRN
  Administered 2022-11-04 (×3): 0.5 mg via INTRAVENOUS

## 2022-11-04 MED ORDER — OXYCODONE HCL 5 MG PO TABS
5.0000 mg | ORAL_TABLET | Freq: Once | ORAL | Status: AC
  Administered 2022-11-04: 5 mg via ORAL

## 2022-11-04 MED ORDER — MIDAZOLAM HCL 2 MG/2ML IJ SOLN
INTRAMUSCULAR | Status: DC | PRN
  Administered 2022-11-04: 2 mg via INTRAVENOUS

## 2022-11-04 MED ORDER — DEXAMETHASONE SODIUM PHOSPHATE 10 MG/ML IJ SOLN
INTRAMUSCULAR | Status: DC | PRN
  Administered 2022-11-04: 5 mg via INTRAVENOUS

## 2022-11-04 MED ORDER — PROPOFOL 10 MG/ML IV BOLUS
INTRAVENOUS | Status: AC
  Filled 2022-11-04: qty 20

## 2022-11-04 MED ORDER — TRANEXAMIC ACID-NACL 1000-0.7 MG/100ML-% IV SOLN
1000.0000 mg | INTRAVENOUS | Status: AC
  Administered 2022-11-04: 1000 mg via INTRAVENOUS
  Filled 2022-11-04: qty 100

## 2022-11-04 MED ORDER — PHENYLEPHRINE 80 MCG/ML (10ML) SYRINGE FOR IV PUSH (FOR BLOOD PRESSURE SUPPORT)
PREFILLED_SYRINGE | INTRAVENOUS | Status: AC
  Filled 2022-11-04: qty 10

## 2022-11-04 SURGICAL SUPPLY — 51 items
ANCH SUT KNTLS STRL SHLDR SYS (Anchor) ×2 IMPLANT
ANCHOR JUGGERKNOT SOFT 1.5 (Anchor) ×1 IMPLANT
ANCHOR JUGGERKNOT SOFT 2.9 (Anchor) IMPLANT
ANCHOR SUT JUGGERKNOT SOFT (Anchor) IMPLANT
ANCHOR SUT QUATTRO KNTLS 4.5 (Anchor) IMPLANT
BAG COUNTER SPONGE SURGICOUNT (BAG) IMPLANT
BAG SPEC THK2 15X12 ZIP CLS (MISCELLANEOUS)
BAG SPNG CNTER NS LX DISP (BAG)
BAG ZIPLOCK 12X15 (MISCELLANEOUS) ×1 IMPLANT
COOLER ICEMAN CLASSIC (MISCELLANEOUS) IMPLANT
COVER SURGICAL LIGHT HANDLE (MISCELLANEOUS) ×1 IMPLANT
DRAPE ORTHO SPLIT 77X108 STRL (DRAPES)
DRAPE SURG 17X11 SM STRL (DRAPES) ×1 IMPLANT
DRAPE SURG ORHT 6 SPLT 77X108 (DRAPES) ×1 IMPLANT
DRAPE U-SHAPE 47X51 STRL (DRAPES) ×1 IMPLANT
DRESSING MEPILEX FLEX 4X4 (GAUZE/BANDAGES/DRESSINGS) ×1 IMPLANT
DRSG AQUACEL AG ADV 3.5X 6 (GAUZE/BANDAGES/DRESSINGS) IMPLANT
DRSG EMULSION OIL 3X16 NADH (GAUZE/BANDAGES/DRESSINGS) ×1 IMPLANT
DRSG MEPILEX FLEX 4X4 (GAUZE/BANDAGES/DRESSINGS)
DRSG MEPILEX POST OP 4X8 (GAUZE/BANDAGES/DRESSINGS) ×1 IMPLANT
DURAPREP 26ML APPLICATOR (WOUND CARE) ×1 IMPLANT
ELECT REM PT RETURN 15FT ADLT (MISCELLANEOUS) ×1 IMPLANT
GAUZE PAD ABD 8X10 STRL (GAUZE/BANDAGES/DRESSINGS) ×1 IMPLANT
GAUZE SPONGE 4X4 12PLY STRL (GAUZE/BANDAGES/DRESSINGS) ×1 IMPLANT
GLOVE BIOGEL PI IND STRL 6.5 (GLOVE) ×1 IMPLANT
GLOVE BIOGEL PI IND STRL 8 (GLOVE) ×1 IMPLANT
GLOVE ECLIPSE 6.0 STRL STRAW (GLOVE) ×1 IMPLANT
GLOVE INDICATOR 8.0 STRL GRN (GLOVE) IMPLANT
GLOVE SURG ORTHO 8.0 STRL STRW (GLOVE) ×1 IMPLANT
GOWN STRL REUS W/ TWL XL LVL3 (GOWN DISPOSABLE) ×2 IMPLANT
GOWN STRL REUS W/TWL XL LVL3 (GOWN DISPOSABLE) ×2
IMPL TAPESTRY BIOINTEGR 30X30 (Orthopedic Implant) IMPLANT
KIT BASIN OR (CUSTOM PROCEDURE TRAY) ×1 IMPLANT
KIT TURNOVER KIT A (KITS) IMPLANT
MANIFOLD NEPTUNE II (INSTRUMENTS) ×1 IMPLANT
NS IRRIG 1000ML POUR BTL (IV SOLUTION) ×1 IMPLANT
PACK TOTAL JOINT (CUSTOM PROCEDURE TRAY) ×1 IMPLANT
PAD COLD SHLDR WRAP-ON (PAD) IMPLANT
PROTECTOR NERVE ULNAR (MISCELLANEOUS) ×1 IMPLANT
STAPLER VISISTAT (STAPLE) IMPLANT
SUT ETHIBOND NAB CT1 #1 30IN (SUTURE) IMPLANT
SUT FIBERWIRE #2 38 T-5 BLUE (SUTURE)
SUT MNCRL AB 4-0 PS2 18 (SUTURE) ×1 IMPLANT
SUT VIC AB 0 CT1 27 (SUTURE) ×1
SUT VIC AB 0 CT1 27XBRD ANBCTR (SUTURE) IMPLANT
SUT VIC AB 1 CT1 27 (SUTURE)
SUT VIC AB 1 CT1 27XBRD ANTBC (SUTURE) ×3 IMPLANT
SUT VIC AB 2-0 CT1 27 (SUTURE) ×2
SUT VIC AB 2-0 CT1 TAPERPNT 27 (SUTURE) ×1 IMPLANT
SUTURE FIBERWR #2 38 T-5 BLUE (SUTURE) IMPLANT
WATER STERILE IRR 1000ML POUR (IV SOLUTION) ×1 IMPLANT

## 2022-11-04 NOTE — Progress Notes (Signed)
Wasted Dilaudid 0.5mg  with Vincent Gros, RN as witness.

## 2022-11-04 NOTE — Transfer of Care (Signed)
Immediate Anesthesia Transfer of Care Note  Patient: [name]  Procedure(s) Performed: LEFT GLUTEUS MEDIUS REPAIR (Left: Buttocks)  Patient Location: PACU  Anesthesia Type:General  Level of Consciousness: awake  Airway & Oxygen Therapy: Patient Spontanous Breathing and Patient connected to face mask oxygen  Post-op Assessment: Report given to RN and Post -op Vital signs reviewed and stable  Post vital signs: Reviewed and stable  Last Vitals:  Vitals Value Taken Time  BP 141/93 11/04/22 1612  Temp    Pulse 83 11/04/22 1614  Resp 17 11/04/22 1614  SpO2 96 % 11/04/22 1614  Vitals shown include unfiled device data.  Last Pain:  Vitals:   11/04/22 1246  TempSrc:   PainSc: 0-No pain         Complications: No notable events documented.

## 2022-11-04 NOTE — Progress Notes (Signed)
When pt. Arrived to Short Stay  CBG 43. Pt. Asymptomatic. Hypoglycemic protocol initiated. dextrose 25g IV given 1248 CBG 186.  Dr. Stephannie Peters notified. No new orders, continue to monitor.

## 2022-11-04 NOTE — H&P (Signed)
Chief Complaint: Left hip pain        History of Present Illness:      [name] is a 67 y.o. female presents with left lateral trochanteric pain for the last 3 years.  She has previously undergone a left total hip arthroplasty by Dr. Magnus Ivan in November 2021.  States that she did very well for 1 year postop and subsequently has developed lateral based hip pain.  She has previously undergone physical therapy with little relief.  She is having a very difficult time putting weight on the side.  She does have worsening pain with any type of significant walking.  She has previously undergone a left trochanteric based injection with Dr. Shon Baton which she says gave her 1 month very good relief.  She is on long-term hydrocodone as result of previous back surgery.  She is also on disability from this as well.       Surgical History:   None   PMH/PSH/Family History/Social History/Meds/Allergies:         Past Medical History:  Diagnosis Date   Allergy     Anemia     Anginal pain (HCC)      last time    Anxiety     Arthritis      RHEUMATOID   Asthma     Bipolar 1 disorder (HCC)     Blood transfusion without reported diagnosis     Cataracts, bilateral 07/2017   CHF (congestive heart failure) (HCC)     COPD (chronic obstructive pulmonary disease) (HCC)     Coronary artery disease      reported hx of "MI";  Echo 2009 with normal LVF;  Myoview 05/2011: no ischemia   Depression     Diabetes mellitus without complication (HCC)     Dyslipidemia     Dysrhythmia      SVT   Esophageal stricture     Fibromyalgia     GERD (gastroesophageal reflux disease)     H/O hiatal hernia     Head injury, unspecified     Headache      migraines   Herpes simplex infection     History of kidney stones     History of loop recorder      Managed by Dr. Sharrell Ku   Hyperlipidemia     Hypertension     Insomnia     Myocardial infarction [facility])      age 8   Osteoporosis     Pneumonia       hx   Seizures (HCC)     Shortness of breath     Sleep apnea      ? neg   Spinal stenosis of lumbar region     Spondylolisthesis     Status post placement of implantable loop recorder     Supraventricular tachycardia     Syncope and collapse      s/p ILR; no arhythmogenic cause identified   UTI (lower urinary tract infection)               Past Surgical History:  Procedure Laterality Date   BACK SURGERY       BREAST EXCISIONAL BIOPSY Right      1990s   BREAST SURGERY        lumpectomy   CARDIAC CATHETERIZATION   10/06/2011   CATARACT EXTRACTION W/PHACO Right 07/31/2017    Procedure: CATARACT EXTRACTION PHACO AND INTRAOCULAR LENS PLACEMENT (IOC);  Surgeon: June Leap,  Fayrene Fearing, MD;  Location: [facility];  Service: Ophthalmology;  Laterality: Right;  CDE: 2.33   CATARACT EXTRACTION W/PHACO Left 08/14/2017    Procedure: CATARACT EXTRACTION PHACO AND INTRAOCULAR LENS PLACEMENT (IOC);  Surgeon: Fabio Pierce, MD;  Location: [facility];  Service: Ophthalmology;  Laterality: Left;  CDE: 2.74   CHOLECYSTECTOMY       CYSTOSCOPY        stone   DIAGNOSTIC LAPAROSCOPY        laparoscopic cholecystectomy   DOPPLER ECHOCARDIOGRAPHY   2009   ESOPHAGOGASTRODUODENOSCOPY N/A 10/31/2020    Procedure: ESOPHAGOGASTRODUODENOSCOPY (EGD);  Surgeon: Gaynelle Adu, MD;  Location: [facility];  Service: General;  Laterality: N/A;   EYE SURGERY Bilateral 08/14/2017    cataract removal   head up tilt table testing   06/15/2007    [name]   HEMORRHOID SURGERY       HERNIA REPAIR       insertion of implatable loop recorder   08/11/2007    [name]   POSTERIOR CERVICAL FUSION/FORAMINOTOMY N/A 12/19/2013    Procedure: RIGHT C3-4.C4-5 AND C5-6 FORAMINOTOMIES;  Surgeon: Kerrin Champagne, MD;  Location: [facility] OR;  Service: Orthopedics;  Laterality: N/A;   TOTAL HIP ARTHROPLASTY Left 01/31/2020    Procedure: LEFT TOTAL HIP ARTHROPLASTY ANTERIOR APPROACH;  Surgeon: Kathryne Hitch, MD;  Location: [facility] OR;   Service: Orthopedics;  Laterality: Left;   TOTAL SHOULDER ARTHROPLASTY Right 11/15/2019    Procedure: RIGHT TOTAL SHOULDER ARTHROPLASTY;  Surgeon: Cammy Copa, MD;  Location: [facility];  Service: Orthopedics;  Laterality: Right;   TOTAL SHOULDER REVISION Right 06/11/2021    Procedure: RIGHT SHOULDER CONVERSION TOTAL SHOULDER ARTHROPLASTY to REVERSE TOTAL SHOULDER ARTHROPLASTY;  Surgeon: Cammy Copa, MD;  Location: [facility] OR;  Service: Orthopedics;  Laterality: Right;   TUBAL LIGATION       UPPER GASTROINTESTINAL ENDOSCOPY       XI ROBOTIC ASSISTED HIATAL HERNIA REPAIR N/A 10/31/2020    Procedure: XI ROBOTIC ASSISTED HIATAL HERNIA REPAIR WITH PARTIAL FUNDOPLICATION;  Surgeon: Gaynelle Adu, MD;  Location: [facility];  Service: General;  Laterality: N/A;        Social History         Socioeconomic History   Marital status: Single      Spouse name: Not on file   Number of children: 5   Years of education: Not on file   Highest education level: 5th grade  Occupational History   Occupation: Disability      Comment: 15 years  Tobacco Use   Smoking status: Never   Smokeless tobacco: Never  Vaping Use   Vaping Use: Never used  Substance and Sexual Activity   Alcohol use: No      Alcohol/week: 0.0 standard drinks of alcohol   Drug use: No   Sexual activity: Yes  Other Topics Concern   Not on file  Social History Narrative    Patient lives alone - 2 granddaughters live nearby - children are very involved    Patient is on disability.     Patient has 5th grade education.     Patient has 5 children, 24 grand-chilren, and 5 great-grand children.     Right handed     Social Determinants of Health        Financial Resource Strain: Low Risk  (09/09/2021)    Overall Financial Resource Strain (CARDIA)     Difficulty of Paying Living Expenses: Not hard at all  Food Insecurity: No Food Insecurity (  09/09/2021)    Hunger Vital Sign     Worried About Running Out of Food in the Last Year:  Never true     Ran Out of Food in the Last Year: Never true  Transportation Needs: No Transportation Needs (09/09/2021)    PRAPARE - Therapist, art (Medical): No     Lack of Transportation (Non-Medical): No  Physical Activity: Inactive (09/09/2021)    Exercise Vital Sign     Days of Exercise per Week: 0 days     Minutes of Exercise per Session: 0 min  Stress: No Stress Concern Present (09/09/2021)    [employer] of Occupational Health - Occupational Stress Questionnaire     Feeling of Stress : Not at all  Social Connections: Moderately Integrated (09/09/2021)    Social Connection and Isolation Panel [NHANES]     Frequency of Communication with Friends and Family: More than three times a week     Frequency of Social Gatherings with Friends and Family: More than three times a week     Attends Religious Services: More than 4 times per year     Active Member of [employer] or Organizations: Yes     Attends Engineer, structural: More than 4 times per year     Marital Status: Never married         Family History  Problem Relation Age of Onset   Heart attack Father     Mental illness Father     Mental illness Mother     Heart attack Brother          stents   Alcohol abuse Brother     Heart disease Brother     Drug abuse Brother     Diabetes Brother     Colon cancer Maternal Aunt     Cirrhosis Brother     Stomach cancer Neg Hx     Esophageal cancer Neg Hx     Rectal cancer Neg Hx          Allergies       Allergies  Allergen Reactions   Codeine Other (See Comments)      "I will have a heart attack."   Morphine And Codeine Other (See Comments)      "It will cause me to have a heart attack."   Ambien [Zolpidem Tartrate] Nausea And Vomiting   Clonidine Derivatives Nausea And Vomiting      gerd - caused acid reflux per pt   Metformin And Related Nausea And Vomiting      cramping from metformin   Lyrica [Pregabalin] Swelling and Other (See  Comments)      Weight gain   Neurontin [Gabapentin] Other (See Comments)      Causes elevated LFTs              Current Outpatient Medications  Medication Sig Dispense Refill   acetaminophen (TYLENOL) 500 MG tablet Take 1 tablet (500 mg total) by mouth every 8 (eight) hours for 10 days. 30 tablet 0   aspirin EC 325 MG tablet Take 1 tablet (325 mg total) by mouth daily. 30 tablet 0   oxyCODONE (ROXICODONE) 5 MG immediate release tablet Take 1 tablet (5 mg total) by mouth every 4 (four) hours as needed for severe pain or breakthrough pain. 30 tablet 0   Accu-Chek Softclix Lancets lancets TEST BLOOD SUGAR UP TO 4 TIMES A DAY AS DIRECTED. R73.03 400 each 3   acyclovir ointment (  ZOVIRAX) 5 % APPLY TOPICALLY EVERY 3 HOURS. 30 g 1   albuterol (VENTOLIN HFA) 108 (90 Base) MCG/ACT inhaler INHALE 2 PUFFS EVERY 6 HOURS AS NEEDED FOR SHORTNESS OF BREATH AND WHEEZING. 6.7 g 2   Alcohol Swabs (B-D SINGLE USE SWABS REGULAR) PADS USE 1 PAD DAILY WHEN CHECKING BLOOD SUGAR. R73.03 100 each 3   amoxicillin-clavulanate (AUGMENTIN) 875-125 MG tablet Take 1 tablet by mouth every 12 (twelve) hours. 14 tablet 0   aspirin EC 81 MG EC tablet Take 1 tablet (81 mg total) by mouth daily. Swallow whole. 30 tablet 0   blood glucose meter kit and supplies Dispense based on patient and insurance preference. Use up to four times daily as directed. (FOR ICD-10 E10.9, E11.9). 1 each 0   busPIRone (BUSPAR) 10 MG tablet Take 1 tablet (10 mg total) by mouth 2 (two) times daily as needed. 60 tablet 5   butalbital-acetaminophen-caffeine (FIORICET) 50-325-40 MG tablet Take 1 tablet by mouth every 6 (six) hours as needed for headache. 20 tablet 2   carvedilol (COREG) 3.125 MG tablet TAKE 1/2 (0.5) TABLET TWICE A DAY WITH A MEAL; BLOOD PRESSURE OVER 150 BEFORE TAKING. 90 tablet 1   celecoxib (CELEBREX) 200 MG capsule Take 1 capsule (200 mg total) by mouth daily. 30 capsule 1   Deutetrabenazine (AUSTEDO) 6 MG TABS Take 1 tablet by mouth  2 (two) times daily. 60 tablet 2   dexlansoprazole (DEXILANT) 60 MG capsule Take 1 capsule (60 mg total) by mouth daily. 90 capsule 1   diclofenac Sodium (VOLTAREN) 1 % GEL APPLY 4 GRAMS TOPICALLY 4 TIMES A DAY. (Patient taking differently: Apply 4 g topically 4 (four) times daily as needed (pain).) 100 g 0   DULoxetine (CYMBALTA) 60 MG capsule Take 1 capsule (60 mg total) by mouth at bedtime. 90 capsule 1   empagliflozin (JARDIANCE) 25 MG TABS tablet take 1 tablet (25 MILLIGRAM total) by mouth daily before breakfast. 30 tablet 1   escitalopram (LEXAPRO) 20 MG tablet Take 1 tablet (20 mg total) by mouth daily. 90 tablet 1   estradiol (ESTRACE) 0.1 MG/GM vaginal cream Place 1 Applicatorful vaginally 3 (three) times a week. 42.5 g 3   FIBER PO Take 1 tablet by mouth in the morning and at bedtime.       fludrocortisone (FLORINEF) 0.1 MG tablet TAKE 1 TABLET ( TOTAL) BY MOUTH EVERY OTHER DAY. 15 tablet 0   fluticasone (FLONASE) 50 MCG/ACT nasal spray USE 1 SPRAY IN EACH NOSTRIL ONCE DAILY. (Patient taking differently: Place 1 spray into both nostrils daily as needed for allergies.) 16 g 5   fluticasone furoate-vilanterol (BREO ELLIPTA) 100-25 MCG/ACT AEPB Inhale 1 puff into the lungs daily. 1 each 11   furosemide (LASIX) 40 MG tablet TAKE 1 TABLET DAILY AS NEEDED FOR EXCESSIVE FLUID. 30 tablet 1   Glucagon, rDNA, (GLUCAGON EMERGENCY) 1 MG KIT Inject 1 mg into the skin as needed. 1 kit 5   glucose blood (ACCU-CHEK GUIDE) test strip TEST BLOOD SUGAR UP TO 4 TIMES A DAY AS DIRECTED. R73.03 400 strip 3   hydrALAZINE (APRESOLINE) 25 MG tablet TAKE 1 TABLET BY MOUTH 3 TIMES DAILY AS NEEDED (FOR SEVERE HYPERTENSION/SYSTOLIC NUMBER 170 OR GREATER). 90 tablet 1   HYDROcodone-acetaminophen (NORCO) 7.5-325 MG tablet take 1 tablet by mouth in the morning, at noon, and at bedtime. 90 tablet 0   ipratropium (ATROVENT) 0.02 % nebulizer solution USE 1 VIAL ( ) IN NEBULIZER EVERY 6 HOURS AS NEEDED FOR WHEEZING  OR  SHORTNESS OF BREATH. 150 mL 2   isosorbide mononitrate (IMDUR) 30 MG 24 hr tablet Take 1.5 tablets (45 mg total) by mouth daily. 45 tablet 6   lamoTRIgine (LAMICTAL) 150 MG tablet Take 1 tablet (150 mg total) by mouth daily. 30 tablet 1   levETIRAcetam (KEPPRA) 500 MG tablet Take 1 tablet (500 mg total) by mouth 2 (two) times daily. 180 tablet 1   levocetirizine (XYZAL) 5 MG tablet TAKE 1 TABLET BY MOUTH EVERY MORNING. 30 tablet 2   lidocaine (LIDODERM) 5 % Place 1 patch onto the skin daily. Remove & Discard patch within 12 hours or as directed by MD 30 patch 3   lidocaine (XYLOCAINE) 5 % ointment APPLY TO AFFECTED AREA 3 TIMES A DAY AS NEEDED FOR MILD OR MODERATE PAIN. 35.44 g 1   methocarbamol (ROBAXIN) 500 MG tablet Take 1 tablet (500 mg total) by mouth every 8 (eight) hours as needed for muscle spasms. 30 tablet 0   methocarbamol (ROBAXIN) 750 MG tablet Take 1 tablet (750 mg total) by mouth every 12 (twelve) hours as needed for muscle spasms. 30 tablet 0   nitroGLYCERIN (NITROSTAT) 0.4 MG SL tablet PLACE ONE (1) TABLET UNDER TONGUE EVERY 5 MINUTES UP TO (3) DOSES AS NEEDED FOR CHEST PAIN. 25 tablet 0   ondansetron (ZOFRAN ODT) 4 MG disintegrating tablet Take 1 tablet (4 mg total) by mouth every 8 (eight) hours as needed for nausea or vomiting. 20 tablet 0   ondansetron (ZOFRAN) 4 MG tablet TAKE 1 TABLET BY MOUTH EVERY 8 HOURS AS NEEDED FOR NAUSEA AND VOMITING. 20 tablet 0   oxybutynin (DITROPAN-XL) 5 MG 24 hr tablet Take 1 tablet (5 mg total) by mouth at bedtime. 90 tablet 1   Probiotic Product (PROBIOTIC PO) Take 1 capsule by mouth daily.       rosuvastatin (CRESTOR) 10 MG tablet Take 1 tablet (10 mg total) by mouth daily. 90 tablet 1   tetrabenazine (XENAZINE) 12.5 MG tablet Take 1 tablet (12.5 mg total) by mouth daily. 30 tablet 2   traZODone (DESYREL) 150 MG tablet Take 1 tablet (150 mg total) by mouth at bedtime. 90 tablet 1   valACYclovir (VALTREX) 1000 MG tablet Take 1 tablet (1,000 mg  total) by mouth 3 (three) times daily. 21 tablet 2   valACYclovir (VALTREX) 500 MG tablet TAKE (1) TABLET TWICE DAILY. 180 tablet 1      No current facility-administered medications for this visit.      Imaging Results (Last 48 hours)  No results found.     Review of Systems:   A ROS was performed including pertinent positives and negatives as documented in the HPI.   Physical Exam :   Constitutional: NAD and appears stated age Neurological: Alert and oriented Psych: Appropriate affect and cooperative There were no vitals taken for this visit.    Comprehensive Musculoskeletal Exam:     Inspection Right Left  Skin No atrophy or gross abnormalities appreciated No atrophy or gross abnormalities appreciated  Palpation      Tenderness None Lateral trochanter  Crepitus None None  Range of Motion      Flexion (passive) 120 120  Extension 30 30  IR 30 30  ER 45 45 with pain  Strength      Flexion  5/5 5/5  Extension 5/5 5/5  Special Tests      FABER Negative Positive  FADIR Negative Negative  ER Lag/Capsular Insufficiency Negative Negative  Instability Negative Negative  Sacroiliac pain Negative  Negative   Instability      Generalized Laxity No No  Neurologic      sciatic, femoral, obturator nerves intact to light sensation  Vascular/Lymphatic      DP pulse 2+ 2+  Lumbar Exam      Patient has symmetric lumbar range of motion with negative pain referral to hip        Imaging:   Xray ([facility] pelvis, 2 views left hip): Status post total hip arthroplasty without evidence of complication   MRI (left hip): There is significant metal artifact although there does appear to be an undersurface tear of gluteus medius with full-thickness tearing of the gluteus minimus   I personally reviewed and interpreted the radiographs.     Assessment:   67 y.o. female with left hip pain consistent with gluteus minimus and medius tearing.  Overall this time she has tried physical therapy  as well as an injection which did give her quite significant relief for 1 year.  Given the fact that she has trialed strengthening without any relief we did discuss potential operative indications.  Typically I do believe that she would be a candidate for gluteus medius and minimus repair with possible collagen patch augmentation.  She would like to consider this given her 3-year history of left hip pain.  I did discuss that there are a few risk factors which would potentially limit her outcome including chronic narcotic usage as well as only 75% response to a lateral hip injection.  After long discussion she has elected for left hip gluteus medius and minimus repair   Plan :     -Plan for left hip gluteus medius and minimus repair with collagen patch augmentation     After a lengthy discussion of treatment options, including risks, benefits, alternatives, complications of surgical and nonsurgical conservative options, the patient elected surgical repair.    The patient  is aware of the material risks  and complications including, but not limited to injury to adjacent structures, neurovascular injury, infection, numbness, bleeding, implant failure, thermal burns, stiffness, persistent pain, failure to heal, disease transmission from allograft, need for further surgery, dislocation, anesthetic risks, blood clots, risks of death,and others. The probabilities of surgical success and failure discussed with patient given their particular co-morbidities.The time and nature of expected rehabilitation and recovery was discussed.The patient's questions were all answered preoperatively.  No barriers to understanding were noted. I explained the natural history of the disease process and Rx rationale.  I explained to the patient what I considered to be reasonable expectations given their personal situation.  The final treatment plan was arrived at through a shared patient decision making process model.            I personally saw and evaluated the patient, and participated in the management and treatment plan.   Huel Cote, MD Attending Physician, Orthopedic Surgery

## 2022-11-04 NOTE — Brief Op Note (Signed)
   Brief Op Note  Date of Surgery: 11/04/2022  Preoperative Diagnosis: LEFT GLUTEUS MEDIUS TEAR  Postoperative Diagnosis: same  Procedure: Procedure(s): LEFT GLUTEUS MEDIUS REPAIR  Implants: Implant Name Type Inv. Item Serial No. Manufacturer Lot No. LRB No. Used Action  ANCHOR SUT JUGGERKNOT SOFT - [id number] Anchor ANCHOR SUT JUGGERKNOT SOFT  [employer](ORTH,TRAU,BIO,SG) [id number] Left 2 Implanted  ANCHOR JUGGERKNOT SOFT 1.5 - [id number] Anchor ANCHOR JUGGERKNOT SOFT 1.5  [employer]ON(ORTH,TRAU,BIO,SG) [id number] Left 1 Implanted  ANCH SUT KNTLS STRL SHLDR SYS - [id number] Anchor ANCH SUT KNTLS STRL SHLDR SYS  [employer]ON(ORTH,TRAU,BIO,SG) [id number] Left 1 Implanted  ANCH SUT KNTLS STRL SHLDR SYS - [id number] Anchor ANCH SUT KNTLS STRL SHLDR SYS  [employer]ON(ORTH,TRAU,BIO,SG) [id number] Left 1 Implanted  IMPL TAPESTRY BIOINTEGR 30X30 - [id number] Orthopedic Implant IMPL TAPESTRY BIOINTEGR 30X30  [employer]MER [employer]ON(ORTH,TRAU,BIO,SG) [id number] Left 1 Implanted    Surgeons: Surgeon(s): Huel Cote, MD  Anesthesia: General    Estimated Blood Loss: See anesthesia record  Complications: None  Condition to PACU: Stable  Benancio Deeds, MD 11/04/2022 3:46 PM

## 2022-11-04 NOTE — Discharge Instructions (Signed)
     Discharge Instructions    Attending Surgeon: Huel Cote, MD Office Phone Number: [phone]   Diagnosis and Procedures:    Surgeries Performed: Left hip gluteus medius repair  Discharge Plan:    Diet: Resume usual diet. Begin with light or bland foods.  Drink plenty of fluids.  Activity:  Touchdown weight bearing left hip. You are advised to go home directly from the hospital or surgical center. Restrict your activities.  GENERAL INSTRUCTIONS: 1.  Please apply ice to your wound to help with swelling and inflammation. This will improve your comfort and your overall recovery following surgery.     2. Please call Dr. Serena Croissant office at [phone] with questions Monday-Friday during business hours. If no one answers, please leave a message and someone should get back to the patient within 24 hours. For emergencies please call 911 or proceed to the emergency room.   3. Patient to notify surgical team if experiences any of the following: Bowel/Bladder dysfunction, uncontrolled pain, nerve/muscle weakness, incision with increased drainage or redness, nausea/vomiting and Fever greater than 101.0 F.  Be alert for signs of infection including redness, streaking, odor, fever or chills. Be alert for excessive pain or bleeding and notify your surgeon immediately.  WOUND INSTRUCTIONS:   Leave your dressing, cast, or splint in place until your post operative visit.  Keep it clean and dry.  Always keep the incision clean and dry until the staples/sutures are removed. If there is no drainage from the incision you should keep it open to air. If there is drainage from the incision you must keep it covered at all times until the drainage stops  Do not soak in a bath tub, hot tub, pool, lake or other body of water until 21 days after your surgery and your incision is completely dry and healed.  If you have removable sutures (or staples) they must be removed 10-14 days (unless otherwise  instructed) from the day of your surgery.     1)  Elevate the extremity as much as possible.  2)  Keep the dressing clean and dry.  3)  Please call us if the dressing becomes wet or dirty.  4)  If you are experiencing worsening pain or worsening swelling, please call.     MEDICATIONS: Resume all previous home medications at the previous prescribed dose and frequency unless otherwise noted Start taking the  pain medications on an as-needed basis as prescribed  Please taper down pain medication over the next week following surgery.  Ideally you should not require a refill of any narcotic pain medication.  Take pain medication with food to minimize nausea. In addition to the prescribed pain medication, you may take over-the-counter pain relievers such as Tylenol.  Do NOT take additional tylenol if your pain medication already has tylenol in it.  Aspirin 325mg  daily for four weeks.      FOLLOWUP INSTRUCTIONS: 1. Follow up at the Physical Therapy Clinic 3-4 days following surgery. This appointment should be scheduled unless other arrangements have been made.The Physical Therapy scheduling number is [phone] if an appointment has not already been arranged.  2. Contact Dr. Serena Croissant office during office hours at [phone] or the practice after hours line at [phone] for non-emergencies. For medical emergencies call 911.   Discharge Location: Home

## 2022-11-04 NOTE — Interval H&P Note (Signed)
History and Physical Interval Note:  11/04/2022 11:53 AM  [name]  has presented today for surgery, with the diagnosis of LEFT GLUTEUS MEDIUS TEAR.  The various methods of treatment have been discussed with the patient and family. After consideration of risks, benefits and other options for treatment, the patient has consented to  Procedure(s): LEFT GLUTEUS MEDIUS REPAIR (Left) as a surgical intervention.  The patient's history has been reviewed, patient examined, no change in status, stable for surgery.  I have reviewed the patient's chart and labs.  Questions were answered to the patient's satisfaction.     Huel Cote

## 2022-11-04 NOTE — Anesthesia Procedure Notes (Signed)
Procedure Name: Intubation Date/Time: 11/04/2022 3:03 PM  Performed by: Linton Rump, MDPre-anesthesia Checklist: Patient identified, Emergency Drugs available, Suction available and Patient being monitored Patient Re-evaluated:Patient Re-evaluated prior to induction Oxygen Delivery Method: Circle system utilized Preoxygenation: Pre-oxygenation with 100% oxygen Induction Type: IV induction Ventilation: Mask ventilation without difficulty Laryngoscope Size: Mac and 3 Grade View: Grade I Tube type: Oral Tube size: 7.0 mm Number of attempts: 1 Airway Equipment and Method: Stylet and Oral airway Placement Confirmation: ETT inserted through vocal cords under direct vision, positive ETCO2 and breath sounds checked- equal and bilateral Secured at: 20 cm Tube secured with: Tape Dental Injury: Teeth and Oropharynx as per pre-operative assessment

## 2022-11-04 NOTE — Anesthesia Postprocedure Evaluation (Signed)
Anesthesia Post Note  Patient: [name]  Procedure(s) Performed: LEFT GLUTEUS MEDIUS REPAIR (Left: Buttocks)     Patient location during evaluation: PACU Anesthesia Type: General Level of consciousness: awake Pain management: pain level controlled Vital Signs Assessment: post-procedure vital signs reviewed and stable Respiratory status: spontaneous breathing, nonlabored ventilation and respiratory function stable Cardiovascular status: blood pressure returned to baseline and stable Postop Assessment: no apparent nausea or vomiting Anesthetic complications: no   No notable events documented.  Last Vitals:  Vitals:   11/04/22 1645 11/04/22 1700  BP: 131/88 (!) 151/85  Pulse: 78 85  Resp: (!) 23 (!) 21  Temp:  36.5 C  SpO2: 93% 97%    Last Pain:  Vitals:   11/04/22 1700  TempSrc:   PainSc: Asleep                 Linton Rump

## 2022-11-04 NOTE — Op Note (Signed)
Date of Surgery: 11/04/2022  INDICATIONS: [name] is a 67 y.o.-year-old female with left hip full thickness gluteus medius tear.  The risk and benefits of the procedure were discussed in detail and documented in the pre-operative evaluation.   PREOPERATIVE DIAGNOSIS: 1. Left hip full thickness gluteus minimus tear. 2. Left hip trochanteric burisits  POSTOPERATIVE DIAGNOSIS: Same.  PROCEDURE: 1. Left hip gluteus medius repair with collagen patch augmentation 2. Left hip trochanteric bursectomy   SURGEON: Benancio Deeds MD  ASSISTANT: Kerby Less, ATC  ANESTHESIA:  general  IV FLUIDS AND URINE: See anesthesia record.  ANTIBIOTICS: Ancef  ESTIMATED BLOOD LOSS: 10 mL.  IMPLANTS:  Implant Name Type Inv. Item Serial No. Manufacturer Lot No. LRB No. Used Action  ANCHOR SUT JUGGERKNOT SOFT - [id number] Anchor ANCHOR SUT JUGGERKNOT SOFT  [employer]IM[employer]ON(ORTH,TRAU,BIO,SG) [id number] Left 2 Implanted  ANCHOR JUGGERKNOT SOFT 1.5 - [id number] Anchor ANCHOR JUGGERKNOT SOFT 1.5  ZIMMER [employer]ON(ORTH,TRAU,BIO,SG) [id number] Left 1 Implanted  ANCH SUT KNTLS STRL SHLDR SYS - [id number] Anchor ANCH SUT KNTLS STRL SHLDR SYS  ZIMMER [employer]ON(ORTH,TRAU,BIO,SG) [id number] Left 1 Implanted  ANCH SUT KNTLS STRL SHLDR SYS - [id number] Anchor ANCH SUT KNTLS STRL SHLDR SYS  ZIMMER [employer]ON(ORTH,TRAU,BIO,SG) [id number] Left 1 Implanted  IMPL TAPESTRY BIOINTEGR 30X30 - [id number] Orthopedic Implant IMPL TAPESTRY BIOINTEGR 30X30  ZIMMER [employer]ON(ORTH,TRAU,BIO,SG) [id number] Left 1 Implanted    DRAINS: None  CULTURES: None  COMPLICATIONS: none  DESCRIPTION OF PROCEDURE:  The patient was identified in the preoperative holding area.  The correct site was marked and confirmed according to nursing.  The patient was subsequently taken back to the operating room.  Antibiotics were given 1 hour prior to incision.  Anesthesia was induced.  He was placed in the lateral position with a beanbag positioner with care to pad the down leg and  peroneal nerve.   Patient was prepped and draped in the usual sterile fashion.  Again timeout was performed confirming the correct side.  An approach was made over the lateral aspect of the greater trochanter.  Dissection was initially carried down sharply with 15 blade and electrocautery was used to perform hemostasis.  A 15 blade was used to make a nick in the IT band which was completed with Mayo scissors.  At this point we encountered the gluteus medius tendon. There was overlying bursa was dissected out sharply with Metzenbaum scissors and removed. This was extremely thin and then completely torn.  This was debrided and the trochanteric lateral posterior facet was prepared using a Cobb.  We then mobilized the gluteus medius abductor muscles with an Allis clamp.  Excess bursal tissue was excised sharply with Mayo scissors.  A double row type configuration was then utilized with 2 Medial Row all suture anchors (a total of 8 limbs).  These were placed through the tissue and then brought to 2 Lateral Row Quattro anchors.  This produced an anatomic footprint restoration of the gluteus tendon. Given the chronicity of the tear, the decision was made to augment with a tapestry patch. This was sutured into place with a 0 vicryl in all four corners.   The wounds were thoroughly irrigated.  We closed in layers of 0 Vicryl for the IT band, 2-0 Vicryl, and staples for skin. The patient was awoken and taken to the PACU.  All counts were correct at the end of the case and no complications.       POSTOPERATIVE PLAN: [name] will be touchdown weight bearing for 2 weeks. [name] will be  placed on aspirin for blood clot prevention. [name] will be seen by PT postop.  Benancio Deeds, MD 3:46 PM

## 2022-11-05 ENCOUNTER — Encounter (HOSPITAL_COMMUNITY): Payer: Self-pay | Admitting: Orthopaedic Surgery

## 2022-11-06 ENCOUNTER — Encounter (HOSPITAL_BASED_OUTPATIENT_CLINIC_OR_DEPARTMENT_OTHER): Payer: 59 | Admitting: Physical Therapy

## 2022-11-07 ENCOUNTER — Other Ambulatory Visit (HOSPITAL_BASED_OUTPATIENT_CLINIC_OR_DEPARTMENT_OTHER): Payer: Self-pay | Admitting: Orthopaedic Surgery

## 2022-11-07 ENCOUNTER — Telehealth: Payer: Self-pay

## 2022-11-07 ENCOUNTER — Ambulatory Visit (HOSPITAL_BASED_OUTPATIENT_CLINIC_OR_DEPARTMENT_OTHER): Payer: 59 | Admitting: Physical Therapy

## 2022-11-07 MED ORDER — HYDROMORPHONE HCL 2 MG PO TABS: 2.0000 mg | ORAL_TABLET | ORAL | 0 refills | Status: DC | PRN

## 2022-11-07 NOTE — Telephone Encounter (Signed)
Patient called wanting to know if she could get something stronger for the pain.  Patient had Left Gluteus surgery on 11/04/2022.  CB# [phone].  Please advise.  Thank you.

## 2022-11-07 NOTE — Telephone Encounter (Signed)
LMOM letting her know

## 2022-11-10 ENCOUNTER — Encounter (HOSPITAL_BASED_OUTPATIENT_CLINIC_OR_DEPARTMENT_OTHER): Payer: 59 | Admitting: Orthopaedic Surgery

## 2022-11-13 ENCOUNTER — Encounter (HOSPITAL_BASED_OUTPATIENT_CLINIC_OR_DEPARTMENT_OTHER): Payer: 59

## 2022-11-17 ENCOUNTER — Ambulatory Visit (INDEPENDENT_AMBULATORY_CARE_PROVIDER_SITE_OTHER): Payer: 59 | Admitting: Orthopaedic Surgery

## 2022-11-17 DIAGNOSIS — S76012D Strain of muscle, fascia and tendon of left hip, subsequent encounter: Secondary | ICD-10-CM

## 2022-11-17 NOTE — Progress Notes (Signed)
Post Operative Evaluation    Procedure/Date of Surgery: Left hip gluteus medius repair 8/6  Interval History:   Presents today 2 weeks status post left hip gluteus medius repair.  Overall she is doing very well.  Her pain is overall feeling much better.  She has been compliant with nonweightbearing precautions.  She is pending physical therapy.   PMH/PSH/Family History/Social History/Meds/Allergies:    Past Medical History:  Diagnosis Date   Allergy    Anemia    Anginal pain (HCC)    last time    Anxiety    Arthritis    RHEUMATOID   Asthma    Bipolar 1 disorder (HCC)    Blood transfusion without reported diagnosis    x 3   Cataracts, bilateral 07/2017   CHF (congestive heart failure) (HCC)    COPD (chronic obstructive pulmonary disease) (HCC)    Coronary artery disease    reported hx of "MI";  Echo 2009 with normal LVF;  Myoview 05/2011: no ischemia   Depression    Diabetes mellitus without complication (HCC)    Dyslipidemia    Dysrhythmia    SVT   Elevated liver enzymes 06/30/2022   Esophageal stricture    Fibromyalgia    GERD (gastroesophageal reflux disease)    H/O hiatal hernia    Head injury, unspecified    Headache    migraines   Herpes simplex infection    History of kidney stones    History of loop recorder 08/10/2017   Managed by Dr. Sharrell Ku   Hyperlipidemia    Hypertension    Insomnia    Myocardial infarction [facility])    age 20   Osteoporosis    Pneumonia    hx   Seizures (HCC)    none in the last 3-4 years as of 11/03/22 per patient   Shortness of breath    Sleep apnea    mild, does not require positive pressure treatment. (02/03/22)   Spinal stenosis of lumbar region    Spondylolisthesis    Status post placement of implantable loop recorder    Supraventricular tachycardia    Syncope and collapse    s/p ILR; no arhythmogenic cause identified   UTI (lower urinary tract infection)    Past Surgical History:   Procedure Laterality Date   BACK SURGERY     BREAST EXCISIONAL BIOPSY Right    1990s   BREAST SURGERY     lumpectomy   CARDIAC CATHETERIZATION  10/06/2011   CATARACT EXTRACTION W/PHACO Right 07/31/2017   Procedure: CATARACT EXTRACTION PHACO AND INTRAOCULAR LENS PLACEMENT (IOC);  Surgeon: Fabio Pierce, MD;  Location: [facility];  Service: Ophthalmology;  Laterality: Right;  CDE: 2.33   CATARACT EXTRACTION W/PHACO Left 08/14/2017   Procedure: CATARACT EXTRACTION PHACO AND INTRAOCULAR LENS PLACEMENT (IOC);  Surgeon: Fabio Pierce, MD;  Location: [facility];  Service: Ophthalmology;  Laterality: Left;  CDE: 2.74   CHOLECYSTECTOMY     CYSTOSCOPY     stone   DIAGNOSTIC LAPAROSCOPY     laparoscopic cholecystectomy   DOPPLER ECHOCARDIOGRAPHY  2009   ESOPHAGOGASTRODUODENOSCOPY N/A 10/31/2020   Procedure: ESOPHAGOGASTRODUODENOSCOPY (EGD);  Surgeon: Gaynelle Adu, MD;  Location: [facility];  Service: General;  Laterality: N/A;   EYE SURGERY Bilateral 08/14/2017   cataract removal   GLUTEUS MINIMUS REPAIR Left 11/04/2022  Procedure: LEFT GLUTEUS MEDIUS REPAIR;  Surgeon: Huel Cote, MD;  Location: [facility] OR;  Service: Orthopedics;  Laterality: Left;   head up tilt table testing  06/15/2007   [name]   HEMORRHOID SURGERY     HERNIA REPAIR     insertion of implatable loop recorder  08/11/2007   [name]   POSTERIOR CERVICAL FUSION/FORAMINOTOMY N/A 12/19/2013   Procedure: RIGHT C3-4.C4-5 AND C5-6 FORAMINOTOMIES;  Surgeon: Kerrin Champagne, MD;  Location[facility];  Service: Orthopedics;  Laterality: N/A;   TOTAL HIP ARTHROPLASTY Left 01/31/2020   Procedure: LEFT TOTAL HIP ARTHROPLASTY ANTERIOR APPROACH;  Surgeon: Kathryne Hitch, MD;  Location: [facility] OR;  Service: Orthopedics;  Laterality: Left;   TOTAL SHOULDER ARTHROPLASTY Right 11/15/2019   Procedure: RIGHT TOTAL SHOULDER ARTHROPLASTY;  Surgeon: Cammy Copa, MD;  Location: [facility];  Service: Orthopedics;  Laterality: Right;   TOTAL  SHOULDER REVISION Right 06/11/2021   Procedure: RIGHT SHOULDER CONVERSION TOTAL SHOULDER ARTHROPLASTY to REVERSE TOTAL SHOULDER ARTHROPLASTY;  Surgeon: Cammy Copa, MD;  Location: [name] OR;  Service: Orthopedics;  Laterality: Right;   TUBAL LIGATION     UPPER GASTROINTESTINAL ENDOSCOPY     XI ROBOTIC ASSISTED HIATAL HERNIA REPAIR N/A 10/31/2020   Procedure: XI ROBOTIC ASSISTED HIATAL HERNIA REPAIR WITH PARTIAL FUNDOPLICATION;  Surgeon: Gaynelle Adu, MD;  Location: [facility];  Service: General;  Laterality: N/A;   Social History   Socioeconomic History   Marital status: Single    Spouse name: Not on file   Number of children: 5   Years of education: Not on file   Highest education level: 5th grade  Occupational History   Occupation: Disability    Comment: 15 years  Tobacco Use   Smoking status: Never   Smokeless tobacco: Never  Vaping Use   Vaping status: Never Used  Substance and Sexual Activity   Alcohol use: No    Alcohol/week: 0.0 standard drinks of alcohol   Drug use: No   Sexual activity: Yes    Birth control/protection: Post-menopausal  Other Topics Concern   Not on file  Social History Narrative   Patient lives alone - 2 granddaughters live nearby - children are very involved   Patient is on disability.    Patient has 5th grade education.    Patient has 5 children, 24 grand-chilren, and 5 great-grand children.    Right handed    Social Determinants of Health   Financial Resource Strain: Low Risk  (10/08/2022)   Overall Financial Resource Strain (CARDIA)    Difficulty of Paying Living Expenses: Not hard at all  Food Insecurity: No Food Insecurity (10/08/2022)   Hunger Vital Sign    Worried About Running Out of Food in the Last Year: Never true    Ran Out of Food in the Last Year: Never true  Transportation Needs: No Transportation Needs (10/08/2022)   PRAPARE - Administrator, Civil Service (Medical): No    Lack of Transportation (Non-Medical): No   Physical Activity: Insufficiently Active (10/08/2022)   Exercise Vital Sign    Days of Exercise per Week: 3 days    Minutes of Exercise per Session: 30 min  Stress: No Stress Concern Present (10/08/2022)   [employer] of Occupational Health - Occupational Stress Questionnaire    Feeling of Stress : Not at all  Social Connections: Moderately Isolated (10/08/2022)   Social Connection and Isolation Panel [NHANES]    Frequency of Communication with Friends and Family: More than  three times a week    Frequency of Social Gatherings with Friends and Family: More than three times a week    Attends Religious Services: More than 4 times per year    Active Member of [employer] or Organizations: No    Attends Engineer, structural: Never    Marital Status: Divorced   Family History  Problem Relation Age of Onset   Heart attack Father    Mental illness Father    Mental illness Mother    Heart attack Brother        stents   Alcohol abuse Brother    Heart disease Brother    Drug abuse Brother    Diabetes Brother    Colon cancer Maternal Aunt    Cirrhosis Brother    Stomach cancer Neg Hx    Esophageal cancer Neg Hx    Rectal cancer Neg Hx    Allergies  Allergen Reactions   Codeine Other (See Comments)    "I will have a heart attack."   Morphine And Codeine Other (See Comments)    "It will cause me to have a heart attack."   Ambien [Zolpidem Tartrate] Nausea And Vomiting   Clonidine Derivatives Nausea And Vomiting    gerd - caused acid reflux per pt   Metformin And Related Nausea And Vomiting    cramping from metformin   Lyrica [Pregabalin] Swelling and Other (See Comments)    Weight gain   Neurontin [Gabapentin] Other (See Comments)    Causes elevated LFTs    Current Outpatient Medications  Medication Sig Dispense Refill   Accu-Chek Softclix Lancets lancets TEST BLOOD SUGAR UP TO 4 TIMES A DAY AS DIRECTED. R73.03 400 each 3   acyclovir ointment (ZOVIRAX) 5 % apply  topically every 3 hours. (Patient taking differently: Apply 1 Application topically every 3 (three) hours as needed (outbreaks).) 30 g 0   albuterol (VENTOLIN HFA) 108 (90 Base) MCG/ACT inhaler INHALE 2 PUFFS EVERY 6 HOURS AS NEEDED FOR SHORTNESS OF BREATH AND WHEEZING. 6.7 g 2   Alcohol Swabs (B-D SINGLE USE SWABS REGULAR) PADS USE 1 PAD DAILY WHEN CHECKING BLOOD SUGAR. R73.03 100 each 3   aspirin EC 325 MG tablet Take 1 tablet (325 mg total) by mouth daily. (Patient not taking: Reported on 10/31/2022) 30 tablet 0   blood glucose meter kit and supplies Dispense based on patient and insurance preference. Use up to four times daily as directed. (FOR ICD-10 E10.9, E11.9). 1 each 0   busPIRone (BUSPAR) 10 MG tablet Take 1 tablet (10 mg total) by mouth 2 (two) times daily as needed. (Patient taking differently: Take 10 mg by mouth 2 (two) times daily. (SCHEDULED)) 60 tablet 5   butalbital-acetaminophen-caffeine (FIORICET) 50-325-40 MG tablet take 1 tablet by mouth every 6 (six) hours as needed for headache. 20 tablet 0   carvedilol (COREG) 3.125 MG tablet TAKE 1/2 (0.5) TABLET TWICE A DAY WITH A MEAL; BLOOD PRESSURE OVER 150 BEFORE TAKING. 90 tablet 1   celecoxib (CELEBREX) 200 MG capsule Take 1 capsule (200 mg total) by mouth daily. 30 capsule 1   dexlansoprazole (DEXILANT) 60 MG capsule Take 1 capsule (60 mg total) by mouth daily. 90 capsule 1   diclofenac Sodium (VOLTAREN) 1 % GEL APPLY 4 GRAMS TOPICALLY 4 TIMES A DAY. (Patient taking differently: Apply 4 g topically 4 (four) times daily as needed (pain).) 100 g 0   DULoxetine (CYMBALTA) 60 MG capsule Take 1 capsule (60 mg total) by mouth  at bedtime. 90 capsule 1   empagliflozin (JARDIANCE) 25 MG TABS tablet take 1 tablet (25 milligram total) by mouth daily before breakfast. 30 tablet 1   escitalopram (LEXAPRO) 20 MG tablet Take 1 tablet (20 mg total) by mouth daily. (Patient taking differently: Take 20 mg by mouth at bedtime.) 90 tablet 1   estradiol  (ESTRACE) 0.1 MG/GM vaginal cream Place 1 Applicatorful vaginally 3 (three) times a week. 42.5 g 3   FIBER PO Take 1 tablet by mouth in the morning and at bedtime.     fludrocortisone (FLORINEF) 0.1 MG tablet take 1 tablet ( total) by mouth every other day. 15 tablet 1   fluticasone (FLONASE) 50 MCG/ACT nasal spray USE 1 SPRAY IN EACH NOSTRIL ONCE DAILY. (Patient taking differently: Place 1 spray into both nostrils daily as needed for allergies.) 16 g 5   fluticasone furoate-vilanterol (BREO ELLIPTA) 100-25 MCG/ACT AEPB Inhale 1 puff into the lungs daily. (Patient taking differently: Inhale 1 puff into the lungs daily as needed (respiratory issues.).) 1 each 11   furosemide (LASIX) 40 MG tablet take 1 tablet daily as needed for excessive fluid. 30 tablet 1   Glucagon, rDNA, (GLUCAGON EMERGENCY) 1 MG KIT Inject 1 mg into the skin as needed. 1 kit 5   glucose blood (ACCU-CHEK GUIDE) test strip TEST BLOOD SUGAR UP TO 4 TIMES A DAY AS DIRECTED. R73.03 400 each 3   hydrALAZINE (APRESOLINE) 25 MG tablet TAKE 1 TABLET BY MOUTH 3 TIMES DAILY AS NEEDED (FOR SEVERE HYPERTENSION/SYSTOLIC NUMBER 170 OR GREATER). 90 tablet 5   [START ON 11/22/2022] HYDROcodone-acetaminophen (NORCO) 7.5-325 MG tablet Take 1 tablet by mouth in the morning, at noon, and at bedtime. (Patient not taking: Reported on 10/31/2022) 90 tablet 0   HYDROcodone-acetaminophen (NORCO) 7.5-325 MG tablet Take 1 tablet by mouth in the morning, at noon, and at bedtime. 90 tablet 0   HYDROcodone-acetaminophen (NORCO) 7.5-325 MG tablet Take 1 tablet by mouth in the morning, at noon, and at bedtime. (Patient not taking: Reported on 10/31/2022) 90 tablet 0   HYDROmorphone (DILAUDID) 2 MG tablet Take 1 tablet (2 mg total) by mouth every 4 (four) hours as needed for severe pain. 30 tablet 0   ipratropium (ATROVENT) 0.02 % nebulizer solution USE 1 VIAL ( ) IN NEBULIZER EVERY 6 HOURS AS NEEDED FOR WHEEZING OR SHORTNESS OF BREATH. 150 mL 2   isosorbide  mononitrate (IMDUR) 60 MG 24 hr tablet Take 1 tablet (60 mg total) by mouth daily. (Patient taking differently: Take 90 mg by mouth in the morning.) 90 tablet 1   lamoTRIgine (LAMICTAL) 150 MG tablet take 1 tablet once daily. (Patient taking differently: Take 150 mg by mouth at bedtime.) 30 tablet 1   levETIRAcetam (KEPPRA) 500 MG tablet Take 1 tablet (500 mg total) by mouth 2 (two) times daily. 180 tablet 1   levocetirizine (XYZAL) 5 MG tablet TAKE 1 TABLET BY MOUTH EVERY MORNING. (Patient taking differently: Take 5 mg by mouth daily as needed for allergies.) 30 tablet 2   lidocaine (LIDODERM) 5 % Place 1 patch onto the skin daily. Remove & Discard patch within 12 hours or as directed by MD (Patient taking differently: Place 1 patch onto the skin daily as needed (pain.). Remove & Discard patch within 12 hours or as directed by MD) 30 patch 3   lidocaine (XYLOCAINE) 5 % ointment APPLY TO AFFECTED AREA 3 TIMES A DAY AS NEEDED FOR MILD OR MODERATE PAIN. 50 g 1   nitroGLYCERIN (  NITROSTAT) 0.4 MG SL tablet place one (1) tablet under tongue every 5 minutes up to (3) doses as needed for chest pain. 25 tablet 1   ondansetron (ZOFRAN) 4 MG tablet take 1 tablet by mouth every 8 hours as needed for nausea and vomiting. 20 tablet 0   oxybutynin (DITROPAN-XL) 5 MG 24 hr tablet Take 1 tablet (5 mg total) by mouth at bedtime. 90 tablet 1   oxyCODONE (ROXICODONE) 5 MG immediate release tablet Take 1 tablet (5 mg total) by mouth every 4 (four) hours as needed for severe pain or breakthrough pain. 30 tablet 0   Probiotic Product (PROBIOTIC PO) Take 1 capsule by mouth in the morning.     rosuvastatin (CRESTOR) 10 MG tablet Take 1 tablet (10 mg total) by mouth daily. 90 tablet 1   traZODone (DESYREL) 150 MG tablet Take 1 tablet (150 mg total) by mouth at bedtime. 90 tablet 1   valACYclovir (VALTREX) 1000 MG tablet take 1 tablet (1,000 MILLIGRAM total) by mouth 3 (three) times daily. (Patient taking differently: Take 1,000  mg by mouth 3 (three) times daily as needed (with active outbreaks.).) 21 tablet 0   valACYclovir (VALTREX) 500 MG tablet take (1) tablet twice daily. 60 tablet 1   No current facility-administered medications for this visit.   No results found.  Review of Systems:   A ROS was performed including pertinent positives and negatives as documented in the HPI.   Musculoskeletal Exam:    There were no vitals taken for this visit.  Left hip incision is well-appearing without erythema or drainage.  Able to flex the hip to 90 degrees.  30 degrees internal/external rotation of the hip.  Hip abduction not tested today.  Distal neurosensory exam is intact  Imaging:      I personally reviewed and interpreted the radiographs.   Assessment:   2 weeks status post left hip gluteus medius repair overall doing well.  At this time she will continue to advance according to rehab protocol.  I will plan to see her back in 4 weeks for reassessment.  She will begin weightbearing at this time.  Plan :    -Return to clinic 4 weeks for reassessment      I personally saw and evaluated the patient, and participated in the management and treatment plan.  Huel Cote, MD Attending Physician, Orthopedic Surgery  This document was dictated using Dragon voice recognition software. A reasonable attempt at proof reading has been made to minimize errors.

## 2022-11-20 ENCOUNTER — Ambulatory Visit (HOSPITAL_BASED_OUTPATIENT_CLINIC_OR_DEPARTMENT_OTHER): Payer: 59 | Attending: Orthopaedic Surgery | Admitting: Physical Therapy

## 2022-11-20 ENCOUNTER — Other Ambulatory Visit: Payer: Self-pay

## 2022-11-20 DIAGNOSIS — M25652 Stiffness of left hip, not elsewhere classified: Secondary | ICD-10-CM | POA: Diagnosis present

## 2022-11-20 DIAGNOSIS — M25552 Pain in left hip: Secondary | ICD-10-CM | POA: Insufficient documentation

## 2022-11-20 DIAGNOSIS — R2689 Other abnormalities of gait and mobility: Secondary | ICD-10-CM | POA: Insufficient documentation

## 2022-11-20 NOTE — Therapy (Signed)
OUTPATIENT PHYSICAL THERAPY LOWER EXTREMITY EVALUATION   Patient Name: [name] MRN: [id number] DOB:1955-05-25, 67 y.o., female Today's Date: 11/20/2022  END OF SESSION:  PT End of Session - 11/20/22 1514     Visit Number 1    Number of Visits 16    Date for PT Re-Evaluation 01/15/23    PT Start Time 1348    PT Stop Time 1425    PT Time Calculation (min) 37 min    Activity Tolerance Patient tolerated treatment well    Behavior During Therapy WFL for tasks assessed/performed             Past Medical History:  Diagnosis Date   Allergy    Anemia    Anginal pain (HCC)    last time    Anxiety    Arthritis    RHEUMATOID   Asthma    Bipolar 1 disorder (HCC)    Blood transfusion without reported diagnosis    x 3   Cataracts, bilateral 07/2017   CHF (congestive heart failure) (HCC)    COPD (chronic obstructive pulmonary disease) (HCC)    Coronary artery disease    reported hx of "MI";  Echo 2009 with normal LVF;  Myoview 05/2011: no ischemia   Depression    Diabetes mellitus without complication (HCC)    Dyslipidemia    Dysrhythmia    SVT   Elevated liver enzymes 06/30/2022   Esophageal stricture    Fibromyalgia    GERD (gastroesophageal reflux disease)    H/O hiatal hernia    Head injury, unspecified    Headache    migraines   Herpes simplex infection    History of kidney stones    History of loop recorder 08/10/2017   Managed by Dr. Sharrell Ku   Hyperlipidemia    Hypertension    Insomnia    Myocardial infarction [facility])    age 67   Osteoporosis    Pneumonia    hx   Seizures (HCC)    none in the last 3-4 years as of 11/03/22 per patient   Shortness of breath    Sleep apnea    mild, does not require positive pressure treatment. (02/03/22)   Spinal stenosis of lumbar region    Spondylolisthesis    Status post placement of implantable loop recorder    Supraventricular tachycardia    Syncope and collapse    s/p ILR; no arhythmogenic cause  identified   UTI (lower urinary tract infection)    Past Surgical History:  Procedure Laterality Date   BACK SURGERY     BREAST EXCISIONAL BIOPSY Right    1990s   BREAST SURGERY     lumpectomy   CARDIAC CATHETERIZATION  10/06/2011   CATARACT EXTRACTION W/PHACO Right 07/31/2017   Procedure: CATARACT EXTRACTION PHACO AND INTRAOCULAR LENS PLACEMENT (IOC);  Surgeon: Fabio Pierce, MD;  Location: [facility];  Service: Ophthalmology;  Laterality: Right;  CDE: 2.33   CATARACT EXTRACTION W/PHACO Left 08/14/2017   Procedure: CATARACT EXTRACTION PHACO AND INTRAOCULAR LENS PLACEMENT (IOC);  Surgeon: Fabio Pierce, MD;  Location: [facility];  Service: Ophthalmology;  Laterality: Left;  CDE: 2.74   CHOLECYSTECTOMY     CYSTOSCOPY     stone   DIAGNOSTIC LAPAROSCOPY     laparoscopic cholecystectomy   DOPPLER ECHOCARDIOGRAPHY  2009   ESOPHAGOGASTRODUODENOSCOPY N/A 10/31/2020   Procedure: ESOPHAGOGASTRODUODENOSCOPY (EGD);  Surgeon: Gaynelle Adu, MD;  Location: [facility];  Service: General;  Laterality: N/A;   EYE SURGERY Bilateral 08/14/2017  cataract removal   GLUTEUS MINIMUS REPAIR Left 11/04/2022   Procedure: LEFT GLUTEUS MEDIUS REPAIR;  Surgeon: Huel Cote, MD;  Location: [facility];  Service: Orthopedics;  Laterality: Left;   head up tilt table testing  06/15/2007   [name]   HEMORRHOID SURGERY     HERNIA REPAIR     insertion of implatable loop recorder  08/11/2007   [name]   POSTERIOR CERVICAL FUSION/FORAMINOTOMY N/A 12/19/2013   Procedure: RIGHT C3-4.C4-5 AND C5-6 FORAMINOTOMIES;  Surgeon: Kerrin Champagne, MD;  Location: [facility] OR;  Service: Orthopedics;  Laterality: N/A;   TOTAL HIP ARTHROPLASTY Left 01/31/2020   Procedure: LEFT TOTAL HIP ARTHROPLASTY ANTERIOR APPROACH;  Surgeon: Kathryne Hitch, MD;  Location: [facility] OR;  Service: Orthopedics;  Laterality: Left;   TOTAL SHOULDER ARTHROPLASTY Right 11/15/2019   Procedure: RIGHT TOTAL SHOULDER ARTHROPLASTY;  Surgeon: Cammy Copa,  MD;  Location: [facility];  Service: Orthopedics;  Laterality: Right;   TOTAL SHOULDER REVISION Right 06/11/2021   Procedure: RIGHT SHOULDER CONVERSION TOTAL SHOULDER ARTHROPLASTY to REVERSE TOTAL SHOULDER ARTHROPLASTY;  Surgeon: Cammy Copa, MD;  Location: [facility] OR;  Service: Orthopedics;  Laterality: Right;   TUBAL LIGATION     UPPER GASTROINTESTINAL ENDOSCOPY     XI ROBOTIC ASSISTED HIATAL HERNIA REPAIR N/A 10/31/2020   Procedure: XI ROBOTIC ASSISTED HIATAL HERNIA REPAIR WITH PARTIAL FUNDOPLICATION;  Surgeon: Gaynelle Adu, MD;  Location: [facility];  Service: General;  Laterality: N/A;   Patient Active Problem List   Diagnosis Date Noted   Tear of left gluteus minimus tendon 11/04/2022   Instability of prosthetic shoulder joint (HCC)    S/P reverse total shoulder arthroplasty, right 06/11/2021   Hypokalemia 11/25/2020   S/P laparoscopic fundoplication 10/31/2020   Status post total replacement of left hip 01/31/2020   Unilateral primary osteoarthritis, left hip 12/21/2019   S/P shoulder replacement, right 11/15/2019   Overweight (BMI 25.0-29.9) 04/15/2019   Diverticulosis 04/14/2019   Chronic diastolic CHF (congestive heart failure) (HCC) 04/14/2019   Unstable angina (HCC) 10/20/2017   Chronic pain 09/23/2016   Pre-diabetes 09/02/2016   Recurrent major depressive disorder, in partial remission (HCC) 07/14/2016   Seizures (HCC) 07/14/2016   GAD (generalized anxiety disorder) 07/14/2016   Insomnia 09/05/2014   Allergic rhinitis 09/05/2014   Cervical spondylosis without myelopathy 12/19/2013    Class: Chronic   Neural foraminal stenosis of cervical spine 12/19/2013   Cervical radiculitis 09/19/2013   Lumbosacral spondylosis without myelopathy 11/16/2012   Postlaminectomy syndrome, lumbar region 11/16/2012   Herpes simplex virus (HSV) infection 10/31/2008   Hyperlipidemia with target LDL less than 100 10/31/2008   Essential hypertension 10/31/2008   GERD 10/31/2008   SPINAL  STENOSIS OF LUMBAR REGION 10/31/2008   Myalgia 10/31/2008   Osteoporosis 10/31/2008   SPONDYLOLISTHESIS 10/31/2008   SYNCOPE 10/31/2008   Sleep apnea 10/31/2008    PCP: Bennie Pierini FNP   REFERRING PROVIDER: Dr Huel Cote   REFERRING DIAG:   THERAPY DIAG:  Pain in left hip  Stiffness of left hip, not elsewhere classified  Other abnormalities of gait and mobility  Rationale for Evaluation and Treatment: Rehabilitation  ONSET DATE: 11/04/222  SUBJECTIVE:   SUBJECTIVE STATEMENT:   PERTINENT HISTORY: Migraines, osteopetrosis, anemia, history of angina, anxiety, rheumatoid arthritis, asthma, bipolar disorder, bilateral cataracts, congestive heart failure, COPD, depression, diabetes, fibromyalgia, history of migraines, history of kidney stones, history of myocardial fraction, osteoporosis, history of seizures although none in the past 3 to 4 years, spinal stenosis with fusion,  supraventricular tachycardia, syncope and collapse, cervical fusion, left total hip arthroplasty, right total shoulder arthroplasty with a revision, PAIN:  Are you having pain? Yes: NPRS scale: 3/10 right now 5/10 at worst  Pain location: anterior and lateral left hip  Pain description: aching  Aggravating factors: standing and walking  Relieving factors: pain medication   PRECAUTIONS: None  RED FLAGS: None   WEIGHT BEARING RESTRICTIONS: No  FALLS:  Has patient fallen in last 6 months? 2 falls; blacks out sometimes; fell a day after the surgery but on the other hip   LIVING ENVIRONMENT: 2 steps into the house and 15 to the upstairs OCCUPATION:  On disability   Hobbies: Church and playing guitar  PLOF: Independent  PATIENT GOALS:  Less pain   NEXT MD VISIT:  September 18th   OBJECTIVE:   DIAGNOSTIC FINDINGS:  Nothing post op   PATIENT SURVEYS:  FOTO    COGNITION: Overall cognitive status: Within functional limits for tasks assessed     SENSATION: Denies  paresthesias   EDEMA:   MUSCLE LENGTH:   POSTURE: No Significant postural limitations  PALPATION: No expected unexpected tenderness palpation  LOWER EXTREMITY ROM:  MMT Right eval Left eval  Hip flexion    Hip extension    Hip abduction    Hip adduction    Hip internal rotation    Hip external rotation    Knee flexion    Knee extension    Ankle dorsiflexion    Ankle plantarflexion    Ankle inversion    Ankle eversion     (Blank rows = not tested) not performed secondary to recent surgery  LOWER EXTREMITY MMT:  Passive range of motion Right eval Left eval  Hip flexion  86 degrees  Hip extension    Hip abduction    Hip adduction    Hip internal rotation  3 degrees  Hip external rotation  Not performed per protocol  Knee flexion    Knee extension    Ankle dorsiflexion    Ankle plantarflexion    Ankle inversion    Ankle eversion     (Blank rows = not tested)    GAIT: Ambulating without assistive device.  Mild decrease in single-leg stance on right leg.  Mild trunk flexion with ambulation. TODAY'S TREATMENT:                                                                                                                              DATE:   Attempted to perform exercises per protocol.  Patient increased pain with quad set.  Exercise halted.  Attempted to perform posterior pelvic tilt.  Patient reports he is unable to perform secondary to spinal stenosis.  The patient is doing well patient advised just to walk at this time.  She reports she has tried stairs patient strongly advised not to do stairs at this time.  She was advised to go up with her nonsurgical leg down  with her surgical leg.   PATIENT EDUCATION:  Education details: HEP symptom management, progression of activity  Person educated: Patient Education method: Explanation, Demonstration, Tactile cues, Verbal cues, and Handouts Education comprehension: verbalized understanding, returned demonstration,  verbal cues required, tactile cues required, and needs further education  HOME EXERCISE PROGRAM: None given secondary to difficulty with basic exercises  ASSESSMENT:  CLINICAL IMPRESSION: Patient is a 67 year old female status post left glute med repair 11/03/2020.  She is doing well at this time.  She is walking with a mild antalgic gait without an assistive device.  Her range of motion is within protocol limits without significant pain.  She has a significant past medical history including left hip replacement, RA, and lumbar spine surgery.  She was advised at this time to continue walking at home.  Her pain is minimal at this time.  Exercises cause an exacerbation in pain even though there base exercises.  We will assess next week and add exercises according to protocol.  OBJECTIVE IMPAIRMENTS: Abnormal gait, decreased activity tolerance, decreased knowledge of condition, difficulty walking, decreased ROM, decreased strength, and pain.   ACTIVITY LIMITATIONS: carrying, lifting, bending, sitting, standing, squatting, stairs, bed mobility, and locomotion level  PARTICIPATION LIMITATIONS: meal prep, cleaning, laundry, shopping, community activity, yard work, and church  PERSONAL FACTORS: 3+ comorbidities: , Left hip replacement, lumbar spine surgery/fusion, right total shoulder replacement, history of unstable angina, some type of syncopal-like episodes that caused her to fall.  are also affecting patient's functional outcome.   REHAB POTENTIAL: Good  CLINICAL DECISION MAKING: Evolving/moderate complexity significant past medical history limiting her ability to perform exercises  EVALUATION COMPLEXITY: Moderate   GOALS: Goals reviewed with patient? Yes  SHORT TERM GOALS: Target date: 12/18/2022   Patient will demonstrate full range of motion within protocol limits at 4 weeks Baseline: Goal status: INITIAL  2.  Patient will ambulate 500 feet without increased pain Baseline:  Goal  status: INITIAL  3.  Patient will transfer sit to stand without increased pain Baseline:  Goal status: INITIAL  4.  Patient will be independent with basic exercise protocol Baseline:  Goal status: INITIAL   LONG TERM GOALS: Target date: 01/15/2023    Patient will go up and down stairs with reciprocal pattern without increased pain Baseline:  Goal status: INITIAL  2.  Patient will ambulate community distances as tolerated without increased pain Baseline:  Goal status: INITIAL  3.  Patient will sit for greater than 30 minutes without increased pain Baseline:  Goal status: INITIAL   PLAN:  PT FREQUENCY: 1-2x/week  PT DURATION: 8 weeks  PLANNED INTERVENTIONS: Therapeutic exercises, Therapeutic activity, Neuromuscular re-education, Balance training, Gait training, Patient/Family education, Self Care, Joint mobilization, Stair training, DME instructions, Aquatic Therapy , Dry Needling, Electrical stimulation, Cryotherapy, Moist heat, Taping, Manual therapy, and Re-evaluation.   PLAN FOR NEXT SESSION:  Consider hip isometrics per protocol, continue with range of motion per protocol, continue to encourage patient to walk.   Dessie Coma, PT 11/20/2022, 3:15 PM

## 2022-11-24 ENCOUNTER — Other Ambulatory Visit: Payer: Self-pay | Admitting: Nurse Practitioner

## 2022-11-25 ENCOUNTER — Ambulatory Visit (HOSPITAL_BASED_OUTPATIENT_CLINIC_OR_DEPARTMENT_OTHER): Payer: 59

## 2022-11-26 ENCOUNTER — Encounter: Payer: Self-pay | Admitting: Nurse Practitioner

## 2022-11-26 ENCOUNTER — Ambulatory Visit (INDEPENDENT_AMBULATORY_CARE_PROVIDER_SITE_OTHER): Payer: 59 | Admitting: Nurse Practitioner

## 2022-11-26 VITALS — BP 112/67 | HR 69 | Temp 98.1°F | Ht 62.0 in | Wt 125.0 lb

## 2022-11-26 DIAGNOSIS — N898 Other specified noninflammatory disorders of vagina: Secondary | ICD-10-CM | POA: Insufficient documentation

## 2022-11-26 DIAGNOSIS — N3001 Acute cystitis with hematuria: Secondary | ICD-10-CM | POA: Insufficient documentation

## 2022-11-26 LAB — URINALYSIS, ROUTINE W REFLEX MICROSCOPIC
Bilirubin, UA: NEGATIVE
Ketones, UA: NEGATIVE
Nitrite, UA: NEGATIVE
Protein,UA: NEGATIVE
Specific Gravity, UA: >1.03 — ABNORMAL HIGH (ref 1.005–1.030)
Urobilinogen, Ur: 0.2 mg/dL (ref 0.2–1.0)
pH, UA: 5 (ref 5.0–7.5)

## 2022-11-26 LAB — MICROSCOPIC EXAMINATION
Renal Epithel, UA: NONE SEEN /hpf
WBC, UA: >30 /hpf — AB (ref 0–5)
Yeast, UA: NONE SEEN

## 2022-11-26 LAB — WET PREP FOR TRICH, YEAST, CLUE
Clue Cell Exam: NEGATIVE
Trichomonas Exam: NEGATIVE
Yeast Exam: NEGATIVE

## 2022-11-26 MED ORDER — SULFAMETHOXAZOLE-TRIMETHOPRIM 400-80 MG PO TABS
1.0000 | ORAL_TABLET | Freq: Two times a day (BID) | ORAL | 0 refills | Status: AC
Start: 2022-11-26 — End: 2022-12-06

## 2022-11-26 NOTE — Progress Notes (Signed)
Acute Office Visit  Subjective:     Patient ID: [name], female    DOB: 1956-02-17, 67 y.o.   MRN: [id number]  Chief Complaint  Patient presents with   Vaginal Discharge    Colored discharge started yesterday     HPI  Vaginitis: Patient complains of an abnormal vaginal discharge for 2 days. Vaginal symptoms include odor and urinary symptoms of dysuria and lower abdominal pain.Vulvar symptoms include discharge described as odorless and green.STI Risk: Possible STD exposureDischarge described as: copious and yellow.Other associated symptoms: none.Menstrual pattern: She had been bleeding none. Contraception: none   Active Ambulatory Problems    Diagnosis Date Noted   Herpes simplex virus (HSV) infection 10/31/2008   Hyperlipidemia with target LDL less than 100 10/31/2008   Essential hypertension 10/31/2008   GERD 10/31/2008   SPINAL STENOSIS OF LUMBAR REGION 10/31/2008   Myalgia 10/31/2008   Osteoporosis 10/31/2008   SPONDYLOLISTHESIS 10/31/2008   SYNCOPE 10/31/2008   Sleep apnea 10/31/2008   Lumbosacral spondylosis without myelopathy 11/16/2012   Postlaminectomy syndrome, lumbar region 11/16/2012   Cervical radiculitis 09/19/2013   Cervical spondylosis without myelopathy 12/19/2013   Neural foraminal stenosis of cervical spine 12/19/2013   Insomnia 09/05/2014   Allergic rhinitis 09/05/2014   Recurrent major depressive disorder, in partial remission (HCC) 07/14/2016   Seizures (HCC) 07/14/2016   GAD (generalized anxiety disorder) 07/14/2016   Pre-diabetes 09/02/2016   Chronic pain 09/23/2016   Unstable angina (HCC) 10/20/2017   Diverticulosis 04/14/2019   Chronic diastolic CHF (congestive heart failure) (HCC) 04/14/2019   Overweight (BMI 25.0-29.9) 04/15/2019   S/P shoulder replacement, right 11/15/2019   Unilateral primary osteoarthritis, left hip 12/21/2019   Status post total replacement of left hip 01/31/2020   S/P laparoscopic fundoplication 10/31/2020    Hypokalemia 11/25/2020   S/P reverse total shoulder arthroplasty, right 06/11/2021   Instability of prosthetic shoulder joint (HCC)    Tear of left gluteus minimus tendon 11/04/2022   Acute cystitis with hematuria 11/26/2022   Vaginal discharge 11/26/2022   Resolved Ambulatory Problems    Diagnosis Date Noted   Depression 10/31/2008   PNEUMONIA 10/31/2008   Asthma 10/31/2008   HEAD TRAUMA, CLOSED 10/31/2008   Chest pain, unspecified 09/30/2011   Sinus tachycardia 10/22/2011   Dysphagia 08/05/2013   Preop cardiovascular exam 11/24/2013   Chest pain 11/29/2014   Diverticulitis of colon 08/24/2015   Moderate episode of recurrent major depressive disorder (HCC) 07/14/2016   Urinary urgency 07/14/2016   Acute pain of right shoulder 07/19/2018   Chest pain 01/17/2019   Abnormal blood sugar 09/29/2019   Left ear pain 01/10/2020   Non-recurrent acute serous otitis media of both ears 01/10/2020   Dysuria 01/10/2020   Syncope 11/25/2020   Past Medical History:  Diagnosis Date   Allergy    Anemia    Anginal pain (HCC)    Anxiety    Arthritis    Asthma    Bipolar 1 disorder (HCC)    Blood transfusion without reported diagnosis    Cataracts, bilateral 07/2017   CHF (congestive heart failure) (HCC)    COPD (chronic obstructive pulmonary disease) (HCC)    Coronary artery disease    Diabetes mellitus without complication (HCC)    Dyslipidemia    Dysrhythmia    Elevated liver enzymes 06/30/2022   Esophageal stricture    Fibromyalgia    GERD (gastroesophageal reflux disease)    H/O hiatal hernia    Headache    Herpes simplex infection  History of kidney stones    History of loop recorder 08/10/2017   Hyperlipidemia    Hypertension    Myocardial infarction [facility])    Osteoporosis    Pneumonia    Shortness of breath    Spinal stenosis of lumbar region    Spondylolisthesis    Status post placement of implantable loop recorder    Supraventricular tachycardia    UTI (lower  urinary tract infection)     ROS Negative unless indicated in HPI    Objective:    BP 112/67   Pulse 69   Temp 98.1 F (36.7 C) (Temporal)   Ht 5\' 2"  (1.575 m)   Wt 125 lb (56.7 kg)   SpO2 96%   BMI 22.86 kg/m  BP Readings from Last 3 Encounters:  11/26/22 112/67  11/04/22 (!) 151/85  09/23/22 127/86   Wt Readings from Last 3 Encounters:  11/26/22 125 lb (56.7 kg)  11/04/22 120 lb (54.4 kg)  10/08/22 120 lb (54.4 kg)      Physical Exam Appears well, in no apparent distress.  Vital signs are normal. The abdomen is soft without tenderness, guarding, mass, rebound or organomegaly. No CVA tenderness or inguinal adenopathy noted. Urine dipstick shows positive for RBC's, positive for glucose, and positive for leukocytes.  Micro exam: >30 WBC's per HPF, 0-2 RBC's per HPF, many+ bacteria,epithelial cells 0-10 and mucus thread.   Wt Prep: negative clue cell, trich, yeast, + bacteria many, epithelial cell few, WBC >25 No results found for any visits on 11/26/22.      Assessment & Plan:  Vaginal discharge -     Urinalysis, Routine w reflex microscopic -     WET PREP FOR TRICH, YEAST, CLUE -     Ct Ng M genitalium NAA, Urine -     Sulfamethoxazole-Trimethoprim; Take 1 tablet by mouth 2 (two) times daily for 10 days.  Dispense: 20 tablet; Refill: 0  Acute cystitis with hematuria -     Sulfamethoxazole-Trimethoprim; Take 1 tablet by mouth 2 (two) times daily for 10 days.  Dispense: 20 tablet; Refill: 0   [name] is a 67 yrs old, no acute distress  ASSESSMENT: UTI uncomplicated without evidence of pyelonephritis, will treat empirically while waiting for culture results Bactrim 1-tab daily for 10 days  - Increase hydration may use Pyridium OTC prn. Call or return to clinic prn if these symptoms worsen or fail to improve as anticipated.    The above assessment and management plan was discussed with the patient. The patient verbalized understanding of and has agreed to the  management plan. Patient is aware to call the clinic if they develop any new symptoms or if symptoms persist or worsen. Patient is aware when to return to the clinic for a follow-up visit. Patient educated on when it is appropriate to go to the emergency department.  Return if symptoms worsen or fail to improve.  Arrie Aran Santa Lighter, DNP [facility] [address] [place], [place] [address] [phone]

## 2022-11-27 ENCOUNTER — Ambulatory Visit (HOSPITAL_BASED_OUTPATIENT_CLINIC_OR_DEPARTMENT_OTHER): Payer: 59 | Admitting: Physical Therapy

## 2022-11-27 ENCOUNTER — Encounter (HOSPITAL_BASED_OUTPATIENT_CLINIC_OR_DEPARTMENT_OTHER): Payer: Self-pay | Admitting: Physical Therapy

## 2022-11-27 DIAGNOSIS — M25552 Pain in left hip: Secondary | ICD-10-CM

## 2022-11-27 DIAGNOSIS — R2689 Other abnormalities of gait and mobility: Secondary | ICD-10-CM

## 2022-11-27 DIAGNOSIS — M25652 Stiffness of left hip, not elsewhere classified: Secondary | ICD-10-CM

## 2022-11-27 NOTE — Therapy (Signed)
OUTPATIENT PHYSICAL THERAPY LOWER EXTREMITY EVALUATION   Patient Name: [name] MRN: [id number] DOB:01-20-1956, 67 y.o., female Today's Date: 11/27/2022  END OF SESSION:  PT End of Session - 11/27/22 1210     Visit Number 2    Number of Visits 16    Date for PT Re-Evaluation 01/15/23    PT Start Time 1145    PT Stop Time 1215   treatment stopped 2nd to progressive pain   PT Time Calculation (min) 30 min    Activity Tolerance Patient tolerated treatment well    Behavior During Therapy [facility] for tasks assessed/performed              Past Medical History:  Diagnosis Date   Allergy    Anemia    Anginal pain (HCC)    last time    Anxiety    Arthritis    RHEUMATOID   Asthma    Bipolar 1 disorder (HCC)    Blood transfusion without reported diagnosis    x 3   Cataracts, bilateral 07/2017   CHF (congestive heart failure) (HCC)    COPD (chronic obstructive pulmonary disease) (HCC)    Coronary artery disease    reported hx of "MI";  Echo 2009 with normal LVF;  Myoview 05/2011: no ischemia   Depression    Diabetes mellitus without complication (HCC)    Dyslipidemia    Dysrhythmia    SVT   Elevated liver enzymes 06/30/2022   Esophageal stricture    Fibromyalgia    GERD (gastroesophageal reflux disease)    H/O hiatal hernia    Head injury, unspecified    Headache    migraines   Herpes simplex infection    History of kidney stones    History of loop recorder 08/10/2017   Managed by Dr. Sharrell Ku   Hyperlipidemia    Hypertension    Insomnia    Myocardial infarction [facility])    age 62   Osteoporosis    Pneumonia    hx   Seizures (HCC)    none in the last 3-4 years as of 11/03/22 per patient   Shortness of breath    Sleep apnea    mild, does not require positive pressure treatment. (02/03/22)   Spinal stenosis of lumbar region    Spondylolisthesis    Status post placement of implantable loop recorder    Supraventricular tachycardia    Syncope and collapse     s/p ILR; no arhythmogenic cause identified   UTI (lower urinary tract infection)    Past Surgical History:  Procedure Laterality Date   BACK SURGERY     BREAST EXCISIONAL BIOPSY Right    1990s   BREAST SURGERY     lumpectomy   CARDIAC CATHETERIZATION  10/06/2011   CATARACT EXTRACTION W/PHACO Right 07/31/2017   Procedure: CATARACT EXTRACTION PHACO AND INTRAOCULAR LENS PLACEMENT (IOC);  Surgeon: Fabio Pierce, MD;  Location: [facility];  Service: Ophthalmology;  Laterality: Right;  CDE: 2.33   CATARACT EXTRACTION W/PHACO Left 08/14/2017   Procedure: CATARACT EXTRACTION PHACO AND INTRAOCULAR LENS PLACEMENT (IOC);  Surgeon: Fabio Pierce, MD;  Location: [facility];  Service: Ophthalmology;  Laterality: Left;  CDE: 2.74   CHOLECYSTECTOMY     CYSTOSCOPY     stone   DIAGNOSTIC LAPAROSCOPY     laparoscopic cholecystectomy   DOPPLER ECHOCARDIOGRAPHY  2009   ESOPHAGOGASTRODUODENOSCOPY N/A 10/31/2020   Procedure: ESOPHAGOGASTRODUODENOSCOPY (EGD);  Surgeon: Gaynelle Adu, MD;  Location: [facility];  Service: General;  Laterality:  N/A;   EYE SURGERY Bilateral 08/14/2017   cataract removal   GLUTEUS MINIMUS REPAIR Left 11/04/2022   Procedure: LEFT GLUTEUS MEDIUS REPAIR;  Surgeon: Huel Cote, MD;  Location: [facility] OR;  Service: Orthopedics;  Laterality: Left;   head up tilt table testing  06/15/2007   [name]   HEMORRHOID SURGERY     HERNIA REPAIR     insertion of implatable loop recorder  08/11/2007   [name]   POSTERIOR CERVICAL FUSION/FORAMINOTOMY N/A 12/19/2013   Procedure: RIGHT C3-4.C4-5 AND C5-6 FORAMINOTOMIES;  Surgeon: Kerrin Champagne, MD;  Location: [facility] OR;  Service: Orthopedics;  Laterality: N/A;   TOTAL HIP ARTHROPLASTY Left 01/31/2020   Procedure: LEFT TOTAL HIP ARTHROPLASTY ANTERIOR APPROACH;  Surgeon: Kathryne Hitch, MD;  Location: [facility] OR;  Service: Orthopedics;  Laterality: Left;   TOTAL SHOULDER ARTHROPLASTY Right 11/15/2019   Procedure: RIGHT TOTAL SHOULDER  ARTHROPLASTY;  Surgeon: Cammy Copa, MD;  Location: [facility];  Service: Orthopedics;  Laterality: Right;   TOTAL SHOULDER REVISION Right 06/11/2021   Procedure: RIGHT SHOULDER CONVERSION TOTAL SHOULDER ARTHROPLASTY to REVERSE TOTAL SHOULDER ARTHROPLASTY;  Surgeon: Cammy Copa, MD;  Location: [facility] OR;  Service: Orthopedics;  Laterality: Right;   TUBAL LIGATION     UPPER GASTROINTESTINAL ENDOSCOPY     XI ROBOTIC ASSISTED HIATAL HERNIA REPAIR N/A 10/31/2020   Procedure: XI ROBOTIC ASSISTED HIATAL HERNIA REPAIR WITH PARTIAL FUNDOPLICATION;  Surgeon: Gaynelle Adu, MD;  Location: [facility];  Service: General;  Laterality: N/A;   Patient Active Problem List   Diagnosis Date Noted   Acute cystitis with hematuria 11/26/2022   Vaginal discharge 11/26/2022   Tear of left gluteus minimus tendon 11/04/2022   Instability of prosthetic shoulder joint (HCC)    S/P reverse total shoulder arthroplasty, right 06/11/2021   Hypokalemia 11/25/2020   S/P laparoscopic fundoplication 10/31/2020   Status post total replacement of left hip 01/31/2020   Unilateral primary osteoarthritis, left hip 12/21/2019   S/P shoulder replacement, right 11/15/2019   Overweight (BMI 25.0-29.9) 04/15/2019   Diverticulosis 04/14/2019   Chronic diastolic CHF (congestive heart failure) (HCC) 04/14/2019   Unstable angina (HCC) 10/20/2017   Chronic pain 09/23/2016   Pre-diabetes 09/02/2016   Recurrent major depressive disorder, in partial remission (HCC) 07/14/2016   Seizures (HCC) 07/14/2016   GAD (generalized anxiety disorder) 07/14/2016   Insomnia 09/05/2014   Allergic rhinitis 09/05/2014   Cervical spondylosis without myelopathy 12/19/2013    Class: Chronic   Neural foraminal stenosis of cervical spine 12/19/2013   Cervical radiculitis 09/19/2013   Lumbosacral spondylosis without myelopathy 11/16/2012   Postlaminectomy syndrome, lumbar region 11/16/2012   Herpes simplex virus (HSV) infection 10/31/2008    Hyperlipidemia with target LDL less than 100 10/31/2008   Essential hypertension 10/31/2008   GERD 10/31/2008   SPINAL STENOSIS OF LUMBAR REGION 10/31/2008   Myalgia 10/31/2008   Osteoporosis 10/31/2008   SPONDYLOLISTHESIS 10/31/2008   SYNCOPE 10/31/2008   Sleep apnea 10/31/2008    PCP: Bennie Pierini FNP   REFERRING PROVIDER: Dr Huel Cote   REFERRING DIAG:   THERAPY DIAG:  Pain in left hip  Stiffness of left hip, not elsewhere classified  Other abnormalities of gait and mobility  Rationale for Evaluation and Treatment: Rehabilitation  ONSET DATE: 11/04/222  SUBJECTIVE:   SUBJECTIVE STATEMENT: The patient is S/P   PERTINENT HISTORY: Migraines, osteopetrosis, anemia, history of angina, anxiety, rheumatoid arthritis, asthma, bipolar disorder, bilateral cataracts, congestive heart failure, COPD, depression, diabetes, fibromyalgia, history of migraines,  history of kidney stones, history of myocardial fraction, osteoporosis, history of seizures although none in the past 3 to 4 years, spinal stenosis with fusion, supraventricular tachycardia, syncope and collapse, cervical fusion, left total hip arthroplasty, right total shoulder arthroplasty with a revision, PAIN:  Are you having pain? Yes: NPRS scale: 3/10 right now 5/10 at worst  Pain location: anterior and lateral left hip  Pain description: aching  Aggravating factors: standing and walking  Relieving factors: pain medication   PRECAUTIONS: None  RED FLAGS: None   WEIGHT BEARING RESTRICTIONS: No  FALLS:  Has patient fallen in last 6 months? 2 falls; blacks out sometimes; fell a day after the surgery but on the other hip   LIVING ENVIRONMENT: 2 steps into the house and 15 to the upstairs OCCUPATION:  On disability   Hobbies: Church and playing guitar  PLOF: Independent  PATIENT GOALS:  Less pain   NEXT MD VISIT:  September 18th   OBJECTIVE:   DIAGNOSTIC FINDINGS:  Nothing post op    PATIENT SURVEYS:  FOTO    COGNITION: Overall cognitive status: Within functional limits for tasks assessed     SENSATION: Denies paresthesias   EDEMA:   MUSCLE LENGTH:   POSTURE: No Significant postural limitations  PALPATION: No expected unexpected tenderness palpation  LOWER EXTREMITY ROM:  MMT Right eval Left eval  Hip flexion    Hip extension    Hip abduction    Hip adduction    Hip internal rotation    Hip external rotation    Knee flexion    Knee extension    Ankle dorsiflexion    Ankle plantarflexion    Ankle inversion    Ankle eversion     (Blank rows = not tested) not performed secondary to recent surgery  LOWER EXTREMITY MMT:  Passive range of motion Right eval Left eval  Hip flexion  86 degrees  Hip extension    Hip abduction    Hip adduction    Hip internal rotation  3 degrees  Hip external rotation  Not performed per protocol  Knee flexion    Knee extension    Ankle dorsiflexion    Ankle plantarflexion    Ankle inversion    Ankle eversion     (Blank rows = not tested)    GAIT: Ambulating without assistive device.  Mild decrease in single-leg stance on right leg.  Mild trunk flexion with ambulation. TODAY'S TREATMENT:                                                                                                                              DATE:   PROM into flexion and IR. Trigger point release to anterior hip in supine and lateral hip in side lying with pillow between the legs.   Hamstring isometric x5 and x10  Quad set 2x10    Standing weight shift 2x20       Eval:Attempted to perform exercises per  protocol.  Patient increased pain with quad set.  Exercise halted.  Attempted to perform posterior pelvic tilt.  Patient reports he is unable to perform secondary to spinal stenosis.  The patient is doing well patient advised just to walk at this time.  She reports she has tried stairs patient strongly advised not to do stairs at  this time.  She was advised to go up with her nonsurgical leg down with her surgical leg.   PATIENT EDUCATION:  Education details: HEP symptom management, progression of activity  Person educated: Patient Education method: Explanation, Demonstration, Tactile cues, Verbal cues, and Handouts Education comprehension: verbalized understanding, returned demonstration, verbal cues required, tactile cues required, and needs further education  HOME EXERCISE PROGRAM: None given secondary to difficulty with basic exercises  ASSESSMENT:  CLINICAL IMPRESSION: The patient is doing well overall. Her pain is well controled. Her ROM is progressing per protocol. Sh is having increased pain with light exercises allowed by protocol. She was advised if she is doing well just walking and doing her daily tasks then that may be the best way to strengthen. We worked on manual therapy to her anterior and posterior hip. We will continue to progress as tolerated.    Eval: Patient is a 67 year old female status post left glute med repair 11/03/2020.  She is doing well at this time.  She is walking with a mild antalgic gait without an assistive device.  Her range of motion is within protocol limits without significant pain.  She has a significant past medical history including left hip replacement, RA, and lumbar spine surgery.  She was advised at this time to continue walking at home.  Her pain is minimal at this time.  Exercises cause an exacerbation in pain even though there base exercises.  We will assess next week and add exercises according to protocol.  OBJECTIVE IMPAIRMENTS: Abnormal gait, decreased activity tolerance, decreased knowledge of condition, difficulty walking, decreased ROM, decreased strength, and pain.   ACTIVITY LIMITATIONS: carrying, lifting, bending, sitting, standing, squatting, stairs, bed mobility, and locomotion level  PARTICIPATION LIMITATIONS: meal prep, cleaning, laundry, shopping,  community activity, yard work, and church  PERSONAL FACTORS: 3+ comorbidities: , Left hip replacement, lumbar spine surgery/fusion, right total shoulder replacement, history of unstable angina, some type of syncopal-like episodes that caused her to fall.  are also affecting patient's functional outcome.   REHAB POTENTIAL: Good  CLINICAL DECISION MAKING: Evolving/moderate complexity significant past medical history limiting her ability to perform exercises  EVALUATION COMPLEXITY: Moderate   GOALS: Goals reviewed with patient? Yes  SHORT TERM GOALS: Target date: 12/18/2022   Patient will demonstrate full range of motion within protocol limits at 4 weeks Baseline: Goal status: INITIAL  2.  Patient will ambulate 500 feet without increased pain Baseline:  Goal status: INITIAL  3.  Patient will transfer sit to stand without increased pain Baseline:  Goal status: INITIAL  4.  Patient will be independent with basic exercise protocol Baseline:  Goal status: INITIAL   LONG TERM GOALS: Target date: 01/15/2023    Patient will go up and down stairs with reciprocal pattern without increased pain Baseline:  Goal status: INITIAL  2.  Patient will ambulate community distances as tolerated without increased pain Baseline:  Goal status: INITIAL  3.  Patient will sit for greater than 30 minutes without increased pain Baseline:  Goal status: INITIAL   PLAN:  PT FREQUENCY: 1-2x/week  PT DURATION: 8 weeks  PLANNED INTERVENTIONS: Therapeutic exercises, Therapeutic activity, Neuromuscular  re-education, Balance training, Gait training, Patient/Family education, Self Care, Joint mobilization, Stair training, DME instructions, Aquatic Therapy , Dry Needling, Electrical stimulation, Cryotherapy, Moist heat, Taping, Manual therapy, and Re-evaluation.   PLAN FOR NEXT SESSION:  Consider hip isometrics per protocol, continue with range of motion per protocol, continue to encourage patient  to walk.   Dessie Coma, PT 11/27/2022, 3:49 PM

## 2022-11-28 ENCOUNTER — Other Ambulatory Visit: Payer: Self-pay | Admitting: Family Medicine

## 2022-11-28 ENCOUNTER — Other Ambulatory Visit: Payer: Self-pay | Admitting: Nurse Practitioner

## 2022-11-28 DIAGNOSIS — B009 Herpesviral infection, unspecified: Secondary | ICD-10-CM

## 2022-11-29 LAB — CT NG M GENITALIUM NAA, URINE
Chlamydia trachomatis, NAA: NEGATIVE
Mycoplasma genitalium NAA: NEGATIVE
Neisseria gonorrhoeae, NAA: POSITIVE — AB

## 2022-12-02 ENCOUNTER — Ambulatory Visit (HOSPITAL_BASED_OUTPATIENT_CLINIC_OR_DEPARTMENT_OTHER): Payer: 59 | Admitting: Physical Therapy

## 2022-12-03 ENCOUNTER — Ambulatory Visit (INDEPENDENT_AMBULATORY_CARE_PROVIDER_SITE_OTHER): Payer: 59 | Admitting: Nurse Practitioner

## 2022-12-03 ENCOUNTER — Encounter: Payer: Self-pay | Admitting: Nurse Practitioner

## 2022-12-03 VITALS — BP 123/80 | HR 78 | Temp 98.1°F | Ht 62.0 in | Wt 123.4 lb

## 2022-12-03 DIAGNOSIS — N898 Other specified noninflammatory disorders of vagina: Secondary | ICD-10-CM | POA: Diagnosis not present

## 2022-12-03 DIAGNOSIS — A549 Gonococcal infection, unspecified: Secondary | ICD-10-CM

## 2022-12-03 MED ORDER — CEFTRIAXONE SODIUM 500 MG IJ SOLR
500.0000 mg | Freq: Once | INTRAMUSCULAR | Status: AC
Start: 2022-12-03 — End: 2022-12-03
  Administered 2022-12-03: 500 mg via INTRAMUSCULAR

## 2022-12-03 NOTE — Progress Notes (Addendum)
Established Patient Office Visit  Subjective   Patient ID: [name], female    DOB: Jan 06, 1956  Age: 67 y.o. MRN: [id number]  Chief Complaint  Patient presents with   sti treatment   HPI  67 year old female presents to the clinic to discuss recent lab results that tested positive for a sexually transmitted infection (STI). She has come in seeking treatment for the STI following her lab work which was positive for gonorrhea. She is still experiencing any symptoms such as abnormal discharge, pain, or itching. She is aware that her partner need to be treated as well.   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   Hypokalemia 11/25/2020   S/P laparoscopic fundoplication 10/31/2020   Status post total replacement of left hip 01/31/2020   Unilateral primary osteoarthritis, left hip 12/21/2019   S/P shoulder replacement, right 11/15/2019   Overweight (BMI 25.0-29.9) 04/15/2019   Diverticulosis 04/14/2019   Chronic diastolic CHF (congestive heart failure) (HCC) 04/14/2019   Unstable angina (HCC) 10/20/2017   Chronic pain 09/23/2016   Pre-diabetes 09/02/2016   Recurrent major depressive disorder, in partial remission (HCC) 07/14/2016   Seizures (HCC) 07/14/2016   GAD (generalized anxiety disorder) 07/14/2016   Insomnia 09/05/2014   Allergic rhinitis 09/05/2014   Cervical spondylosis without myelopathy 12/19/2013    Class: Chronic   Neural foraminal stenosis of cervical spine 12/19/2013   Cervical radiculitis 09/19/2013   Lumbosacral spondylosis without myelopathy 11/16/2012   Postlaminectomy syndrome, lumbar region 11/16/2012   Herpes simplex virus (HSV) infection 10/31/2008   Hyperlipidemia with target LDL less  than 100 10/31/2008   Essential hypertension 10/31/2008   GERD 10/31/2008   SPINAL STENOSIS OF LUMBAR REGION 10/31/2008   Myalgia 10/31/2008   Osteoporosis 10/31/2008   SPONDYLOLISTHESIS 10/31/2008   SYNCOPE 10/31/2008   Sleep apnea 10/31/2008   Past Medical History:  Diagnosis Date   Allergy    Anemia    Anginal pain (HCC)    last time    Anxiety    Arthritis    RHEUMATOID   Asthma    Bipolar 1 disorder (HCC)    Blood transfusion without reported diagnosis    x 3   Cataracts, bilateral 07/2017   CHF (congestive heart failure) (HCC)    COPD (chronic obstructive pulmonary disease) (HCC)    Coronary artery disease    reported hx of "MI";  Echo 2009 with normal LVF;  Myoview 05/2011: no ischemia   Depression    Diabetes mellitus without complication (HCC)    Dyslipidemia    Dysrhythmia    SVT   Elevated liver enzymes 06/30/2022   Esophageal stricture    Fibromyalgia    GERD (gastroesophageal reflux disease)    H/O hiatal hernia    Head injury, unspecified    Headache    migraines   Herpes simplex infection    History of kidney stones    History of loop recorder 08/10/2017   Managed by Dr. Sharrell Ku   Hyperlipidemia    Hypertension    Insomnia    Myocardial infarction [facility])    age 22   Osteoporosis    Pneumonia    hx   Seizures (HCC)    none in the last 3-4 years  as of 11/03/22 per patient   Shortness of breath    Sleep apnea    mild, does not require positive pressure treatment. (02/03/22)   Spinal stenosis of lumbar region    Spondylolisthesis    Status post placement of implantable loop recorder    Supraventricular tachycardia    Syncope and collapse    s/p ILR; no arhythmogenic cause identified   UTI (lower urinary tract infection)    Past Surgical History:  Procedure Laterality Date   BACK SURGERY     BREAST EXCISIONAL BIOPSY Right    1990s   BREAST SURGERY     lumpectomy   CARDIAC CATHETERIZATION  10/06/2011   CATARACT EXTRACTION W/PHACO Right  07/31/2017   Procedure: CATARACT EXTRACTION PHACO AND INTRAOCULAR LENS PLACEMENT (IOC);  Surgeon: Fabio Pierce, MD;  Location: [facility];  Service: Ophthalmology;  Laterality: Right;  CDE: 2.33   CATARACT EXTRACTION W/PHACO Left 08/14/2017   Procedure: CATARACT EXTRACTION PHACO AND INTRAOCULAR LENS PLACEMENT (IOC);  Surgeon: Fabio Pierce, MD;  Location: [facility];  Service: Ophthalmology;  Laterality: Left;  CDE: 2.74   CHOLECYSTECTOMY     CYSTOSCOPY     stone   DIAGNOSTIC LAPAROSCOPY     laparoscopic cholecystectomy   DOPPLER ECHOCARDIOGRAPHY  2009   ESOPHAGOGASTRODUODENOSCOPY N/A 10/31/2020   Procedure: ESOPHAGOGASTRODUODENOSCOPY (EGD);  Surgeon: Gaynelle Adu, MD;  Location: [facility];  Service: General;  Laterality: N/A;   EYE SURGERY Bilateral 08/14/2017   cataract removal   GLUTEUS MINIMUS REPAIR Left 11/04/2022   Procedure: LEFT GLUTEUS MEDIUS REPAIR;  Surgeon: Huel Cote, MD;  Location: [facility] OR;  Service: Orthopedics;  Laterality: Left;   head up tilt table testing  06/15/2007   [name]   HEMORRHOID SURGERY     HERNIA REPAIR     insertion of implatable loop recorder  08/11/2007   [name]   POSTERIOR CERVICAL FUSION/FORAMINOTOMY N/A 12/19/2013   Procedure: RIGHT C3-4.C4-5 AND C5-6 FORAMINOTOMIES;  Surgeon: Kerrin Champagne, MD;  Location[facility];  Service: Orthopedics;  Laterality: N/A;   TOTAL HIP ARTHROPLASTY Left 01/31/2020   Procedure: LEFT TOTAL HIP ARTHROPLASTY ANTERIOR APPROACH;  Surgeon: Kathryne Hitch, MD;  Location: [facility] OR;  Service: Orthopedics;  Laterality: Left;   TOTAL SHOULDER ARTHROPLASTY Right 11/15/2019   Procedure: RIGHT TOTAL SHOULDER ARTHROPLASTY;  Surgeon: Cammy Copa, MD;  Location: [facility];  Service: Orthopedics;  Laterality: Right;   TOTAL SHOULDER REVISION Right 06/11/2021   Procedure: RIGHT SHOULDER CONVERSION TOTAL SHOULDER ARTHROPLASTY to REVERSE TOTAL SHOULDER ARTHROPLASTY;  Surgeon: Cammy Copa, MD;  Location: [facility] OR;  Service:  Orthopedics;  Laterality: Right;   TUBAL LIGATION     UPPER GASTROINTESTINAL ENDOSCOPY     XI ROBOTIC ASSISTED HIATAL HERNIA REPAIR N/A 10/31/2020   Procedure: XI ROBOTIC ASSISTED HIATAL HERNIA REPAIR WITH PARTIAL FUNDOPLICATION;  Surgeon: Gaynelle Adu, MD;  Location: [facility];  Service: General;  Laterality: N/A;   Social History   Tobacco Use   Smoking status: Never   Smokeless tobacco: Never  Vaping Use   Vaping status: Never Used  Substance Use Topics   Alcohol use: No    Alcohol/week: 0.0 standard drinks of alcohol   Drug use: No   Social History   Socioeconomic History   Marital status: Single    Spouse name: Not on file   Number of children: 5   Years of education: Not on file   Highest education level: 5th grade  Occupational History   Occupation: Disability  Comment: 15 years  Tobacco Use   Smoking status: Never   Smokeless tobacco: Never  Vaping Use   Vaping status: Never Used  Substance and Sexual Activity   Alcohol use: No    Alcohol/week: 0.0 standard drinks of alcohol   Drug use: No   Sexual activity: Yes    Birth control/protection: Post-menopausal  Other Topics Concern   Not on file  Social History Narrative   Patient lives alone - 2 granddaughters live nearby - children are very involved   Patient is on disability.    Patient has 5th grade education.    Patient has 5 children, 24 grand-chilren, and 5 great-grand children.    Right handed    Social Determinants of Health   Financial Resource Strain: Low Risk  (10/08/2022)   Overall Financial Resource Strain (CARDIA)    Difficulty of Paying Living Expenses: Not hard at all  Food Insecurity: No Food Insecurity (10/08/2022)   Hunger Vital Sign    Worried About Running Out of Food in the Last Year: Never true    Ran Out of Food in the Last Year: Never true  Transportation Needs: No Transportation Needs (10/08/2022)   PRAPARE - Administrator, Civil Service (Medical): No    Lack of  Transportation (Non-Medical): No  Physical Activity: Insufficiently Active (10/08/2022)   Exercise Vital Sign    Days of Exercise per Week: 3 days    Minutes of Exercise per Session: 30 min  Stress: No Stress Concern Present (10/08/2022)   [employer] of Occupational Health - Occupational Stress Questionnaire    Feeling of Stress : Not at all  Social Connections: Moderately Isolated (10/08/2022)   Social Connection and Isolation Panel [NHANES]    Frequency of Communication with Friends and Family: More than three times a week    Frequency of Social Gatherings with Friends and Family: More than three times a week    Attends Religious Services: More than 4 times per year    Active Member of [employer] or Organizations: No    Attends Banker Meetings: Never    Marital Status: Divorced  Catering manager Violence: Not At Risk (10/08/2022)   Humiliation, Afraid, Rape, and Kick questionnaire    Fear of Current or Ex-Partner: No    Emotionally Abused: No    Physically Abused: No    Sexually Abused: No   Family Status  Relation Name Status   Father  Deceased at age 30s   Mother  Alive   Brother  Deceased   Youth worker  (Not Specified)   MGM  Deceased   MGF  Deceased   PGM  Deceased   PGF  Deceased   Daughter  Alive   Son  Deceased at age 34 month   Brother  Deceased at age 64   Daughter  Alive   Daughter  Alive   Daughter  Alive   Neg Hx  (Not Specified)  No partnership data on file   Family History  Problem Relation Age of Onset   Heart attack Father    Mental illness Father    Mental illness Mother    Heart attack Brother        stents   Alcohol abuse Brother    Heart disease Brother    Drug abuse Brother    Diabetes Brother    Colon cancer Maternal Aunt    Cirrhosis Brother    Stomach cancer Neg Hx    Esophageal cancer  Neg Hx    Rectal cancer Neg Hx    Allergies  Allergen Reactions   Codeine Other (See Comments)    "I will have a heart attack."    Morphine And Codeine Other (See Comments)    "It will cause me to have a heart attack."   Ambien [Zolpidem Tartrate] Nausea And Vomiting   Clonidine Derivatives Nausea And Vomiting    gerd - caused acid reflux per pt   Metformin And Related Nausea And Vomiting    cramping from metformin   Lyrica [Pregabalin] Swelling and Other (See Comments)    Weight gain   Neurontin [Gabapentin] Other (See Comments)    Causes elevated LFTs       ROS Negative unless indicated in HPI   Objective:     BP 123/80   Pulse 78   Temp 98.1 F (36.7 C) (Temporal)   Ht 5\' 2"  (1.575 m)   Wt 123 lb 6.4 oz (56 kg)   SpO2 94%   BMI 22.57 kg/m  BP Readings from Last 3 Encounters:  12/03/22 123/80  11/26/22 112/67  11/04/22 (!) 151/85   Wt Readings from Last 3 Encounters:  12/03/22 123 lb 6.4 oz (56 kg)  11/26/22 125 lb (56.7 kg)  11/04/22 120 lb (54.4 kg)      Physical Exam Constitutional:      Appearance: Normal appearance.  HENT:     Head: Normocephalic and atraumatic.  Cardiovascular:     Rate and Rhythm: Normal rate and regular rhythm.  Pulmonary:     Effort: Pulmonary effort is normal.     Breath sounds: Normal breath sounds.  Musculoskeletal:        General: Normal range of motion.     Right lower leg: No edema.     Left lower leg: No edema.  Skin:    General: Skin is warm and dry.     Findings: No rash.  Neurological:     Mental Status: She is alert and oriented to person, place, and time. Mental status is at baseline.  Psychiatric:        Mood and Affect: Mood normal.        Behavior: Behavior normal.        Thought Content: Thought content normal.        Judgment: Judgment normal.      No results found for any visits on 12/03/22.  Last CBC Lab Results  Component Value Date   WBC 5.0 11/04/2022   HGB 13.9 11/04/2022   HCT 42.2 11/04/2022   MCV 97.9 11/04/2022   MCH 32.3 11/04/2022   RDW 12.2 11/04/2022   PLT 142 (L) 11/04/2022   Last metabolic panel Lab  Results  Component Value Date   GLUCOSE 51 (L) 11/04/2022   NA 143 11/04/2022   K 3.1 (L) 11/04/2022   CL 107 11/04/2022   CO2 22 11/04/2022   BUN 13 11/04/2022   CREATININE 0.81 11/04/2022   GFRNONAA >60 11/04/2022   CALCIUM 9.2 11/04/2022   PHOS 4.1 04/17/2019   PROT 6.1 (L) 11/04/2022   ALBUMIN 3.3 (L) 11/04/2022   LABGLOB 2.2 09/23/2022   AGRATIO 1.9 07/01/2022   BILITOT 0.6 11/04/2022   ALKPHOS 90 11/04/2022   AST 21 11/04/2022   ALT 29 11/04/2022   ANIONGAP 14 11/04/2022   Last lipids Lab Results  Component Value Date   CHOL 160 06/26/2022   HDL 63 06/26/2022   LDLCALC 76 06/26/2022   LDLDIRECT 142.1 04/22/2007  TRIG 117 06/26/2022   CHOLHDL 2.5 06/26/2022   Last thyroid functions Lab Results  Component Value Date   TSH 1.050 01/01/2021   T4TOTAL 8.1 01/01/2021        Assessment & Plan:  Vaginal discharge -     cefTRIAXone Sodium  Gonorrhea -     cefTRIAXone Sodium   Rowen is 67 yrs old female seen today for Gonorrhea treatment, no acute distress She is treated with Rocephin 500 mg one time IM injection Since it a reportable disease, health department notified - client's partner plan to go to the health department to seek treatment. - need avoid intercourse until symptoms is resolved  The above assessment and management plan was discussed with the patient. The patient verbalized understanding of and has agreed to the management plan. Patient is aware to call the clinic if they develop any new symptoms or if symptoms persist or worsen. Patient is aware when to return to the clinic for a follow-up visit. Patient educated on when it is appropriate to go to the emergency department.  Return for folow as already scheduled with PCP.   Arrie Aran Santa Lighter, DNP [facility] [address] [place], [place] [address] [phone]

## 2022-12-04 ENCOUNTER — Ambulatory Visit (HOSPITAL_BASED_OUTPATIENT_CLINIC_OR_DEPARTMENT_OTHER): Payer: 59

## 2022-12-17 ENCOUNTER — Ambulatory Visit (INDEPENDENT_AMBULATORY_CARE_PROVIDER_SITE_OTHER): Payer: 59 | Admitting: Orthopaedic Surgery

## 2022-12-17 ENCOUNTER — Other Ambulatory Visit (HOSPITAL_BASED_OUTPATIENT_CLINIC_OR_DEPARTMENT_OTHER): Payer: Self-pay

## 2022-12-17 ENCOUNTER — Telehealth (HOSPITAL_BASED_OUTPATIENT_CLINIC_OR_DEPARTMENT_OTHER): Payer: Self-pay | Admitting: Orthopaedic Surgery

## 2022-12-17 DIAGNOSIS — S76012D Strain of muscle, fascia and tendon of left hip, subsequent encounter: Secondary | ICD-10-CM

## 2022-12-17 MED ORDER — METHOCARBAMOL 500 MG PO TABS
500.0000 mg | ORAL_TABLET | Freq: Four times a day (QID) | ORAL | 4 refills | Status: DC
  Filled 2022-12-17: qty 30, 8d supply, fill #0

## 2022-12-17 NOTE — Telephone Encounter (Signed)
Patient wants a note saying she was here today to be sent to her mychart.

## 2022-12-17 NOTE — Progress Notes (Signed)
Post Operative Evaluation    Procedure/Date of Surgery: Left hip gluteus medius repair 8/6  Interval History:   Presents today 6 weeks status post the above procedure.  Overall she is doing very well.  She states that there is a night and day difference with her preoperative pain.  She is walking now with any type of assistive devices.  She does have some pain going up stairs  PMH/PSH/Family History/Social History/Meds/Allergies:    Past Medical History:  Diagnosis Date   Allergy    Anemia    Anginal pain (HCC)    last time    Anxiety    Arthritis    RHEUMATOID   Asthma    Bipolar 1 disorder (HCC)    Blood transfusion without reported diagnosis    x 3   Cataracts, bilateral 07/2017   CHF (congestive heart failure) (HCC)    COPD (chronic obstructive pulmonary disease) (HCC)    Coronary artery disease    reported hx of "MI";  Echo 2009 with normal LVF;  Myoview 05/2011: no ischemia   Depression    Diabetes mellitus without complication (HCC)    Dyslipidemia    Dysrhythmia    SVT   Elevated liver enzymes 06/30/2022   Esophageal stricture    Fibromyalgia    GERD (gastroesophageal reflux disease)    H/O hiatal hernia    Head injury, unspecified    Headache    migraines   Herpes simplex infection    History of kidney stones    History of loop recorder 08/10/2017   Managed by Dr. Sharrell Ku   Hyperlipidemia    Hypertension    Insomnia    Myocardial infarction [facility])    age 72   Osteoporosis    Pneumonia    hx   Seizures (HCC)    none in the last 3-4 years as of 11/03/22 per patient   Shortness of breath    Sleep apnea    mild, does not require positive pressure treatment. (02/03/22)   Spinal stenosis of lumbar region    Spondylolisthesis    Status post placement of implantable loop recorder    Supraventricular tachycardia    Syncope and collapse    s/p ILR; no arhythmogenic cause identified   UTI (lower urinary tract  infection)    Past Surgical History:  Procedure Laterality Date   BACK SURGERY     BREAST EXCISIONAL BIOPSY Right    1990s   BREAST SURGERY     lumpectomy   CARDIAC CATHETERIZATION  10/06/2011   CATARACT EXTRACTION W/PHACO Right 07/31/2017   Procedure: CATARACT EXTRACTION PHACO AND INTRAOCULAR LENS PLACEMENT (IOC);  Surgeon: Fabio Pierce, MD;  Location: [facility];  Service: Ophthalmology;  Laterality: Right;  CDE: 2.33   CATARACT EXTRACTION W/PHACO Left 08/14/2017   Procedure: CATARACT EXTRACTION PHACO AND INTRAOCULAR LENS PLACEMENT (IOC);  Surgeon: Fabio Pierce, MD;  Location: [facility];  Service: Ophthalmology;  Laterality: Left;  CDE: 2.74   CHOLECYSTECTOMY     CYSTOSCOPY     stone   DIAGNOSTIC LAPAROSCOPY     laparoscopic cholecystectomy   DOPPLER ECHOCARDIOGRAPHY  2009   ESOPHAGOGASTRODUODENOSCOPY N/A 10/31/2020   Procedure: ESOPHAGOGASTRODUODENOSCOPY (EGD);  Surgeon: Gaynelle Adu, MD;  Location: [facility];  Service: General;  Laterality: N/A;   EYE SURGERY Bilateral 08/14/2017  cataract removal   GLUTEUS MINIMUS REPAIR Left 11/04/2022   Procedure: LEFT GLUTEUS MEDIUS REPAIR;  Surgeon: Huel Cote, MD;  Location: [facility] OR;  Service: Orthopedics;  Laterality: Left;   head up tilt table testing  06/15/2007   [name]   HEMORRHOID SURGERY     HERNIA REPAIR     insertion of implatable loop recorder  08/11/2007   [name]   POSTERIOR CERVICAL FUSION/FORAMINOTOMY N/A 12/19/2013   Procedure: RIGHT C3-4.C4-5 AND C5-6 FORAMINOTOMIES;  Surgeon: Kerrin Champagne, MD;  Location: [facility] OR;  Service: Orthopedics;  Laterality: N/A;   TOTAL HIP ARTHROPLASTY Left 01/31/2020   Procedure: LEFT TOTAL HIP ARTHROPLASTY ANTERIOR APPROACH;  Surgeon: Kathryne Hitch, MD;  Location: [facility] OR;  Service: Orthopedics;  Laterality: Left;   TOTAL SHOULDER ARTHROPLASTY Right 11/15/2019   Procedure: RIGHT TOTAL SHOULDER ARTHROPLASTY;  Surgeon: Cammy Copa, MD;  Location: [facility];  Service:  Orthopedics;  Laterality: Right;   TOTAL SHOULDER REVISION Right 06/11/2021   Procedure: RIGHT SHOULDER CONVERSION TOTAL SHOULDER ARTHROPLASTY to REVERSE TOTAL SHOULDER ARTHROPLASTY;  Surgeon: Cammy Copa, MD;  Location: [facility] OR;  Service: Orthopedics;  Laterality: Right;   TUBAL LIGATION     UPPER GASTROINTESTINAL ENDOSCOPY     XI ROBOTIC ASSISTED HIATAL HERNIA REPAIR N/A 10/31/2020   Procedure: XI ROBOTIC ASSISTED HIATAL HERNIA REPAIR WITH PARTIAL FUNDOPLICATION;  Surgeon: Gaynelle Adu, MD;  Location: [facility];  Service: General;  Laterality: N/A;   Social History   Socioeconomic History   Marital status: Single    Spouse name: Not on file   Number of children: 5   Years of education: Not on file   Highest education level: 5th grade  Occupational History   Occupation: Disability    Comment: 15 years  Tobacco Use   Smoking status: Never   Smokeless tobacco: Never  Vaping Use   Vaping status: Never Used  Substance and Sexual Activity   Alcohol use: No    Alcohol/week: 0.0 standard drinks of alcohol   Drug use: No   Sexual activity: Yes    Birth control/protection: Post-menopausal  Other Topics Concern   Not on file  Social History Narrative   Patient lives alone - 2 granddaughters live nearby - children are very involved   Patient is on disability.    Patient has 5th grade education.    Patient has 5 children, 24 grand-chilren, and 5 great-grand children.    Right handed    Social Determinants of Health   Financial Resource Strain: Low Risk  (10/08/2022)   Overall Financial Resource Strain (CARDIA)    Difficulty of Paying Living Expenses: Not hard at all  Food Insecurity: No Food Insecurity (10/08/2022)   Hunger Vital Sign    Worried About Running Out of Food in the Last Year: Never true    Ran Out of Food in the Last Year: Never true  Transportation Needs: No Transportation Needs (10/08/2022)   PRAPARE - Administrator, Civil Service (Medical): No     Lack of Transportation (Non-Medical): No  Physical Activity: Insufficiently Active (10/08/2022)   Exercise Vital Sign    Days of Exercise per Week: 3 days    Minutes of Exercise per Session: 30 min  Stress: No Stress Concern Present (10/08/2022)   [employer] of Occupational Health - Occupational Stress Questionnaire    Feeling of Stress : Not at all  Social Connections: Moderately Isolated (10/08/2022)   Social Connection and Isolation Panel [NHANES]  Frequency of Communication with Friends and Family: More than three times a week    Frequency of Social Gatherings with Friends and Family: More than three times a week    Attends Religious Services: More than 4 times per year    Active Member of [employer] or Organizations: No    Attends Engineer, structural: Never    Marital Status: Divorced   Family History  Problem Relation Age of Onset   Heart attack Father    Mental illness Father    Mental illness Mother    Heart attack Brother        stents   Alcohol abuse Brother    Heart disease Brother    Drug abuse Brother    Diabetes Brother    Colon cancer Maternal Aunt    Cirrhosis Brother    Stomach cancer Neg Hx    Esophageal cancer Neg Hx    Rectal cancer Neg Hx    Allergies  Allergen Reactions   Codeine Other (See Comments)    "I will have a heart attack."   Morphine And Codeine Other (See Comments)    "It will cause me to have a heart attack."   Ambien [Zolpidem Tartrate] Nausea And Vomiting   Clonidine Derivatives Nausea And Vomiting    gerd - caused acid reflux per pt   Metformin And Related Nausea And Vomiting    cramping from metformin   Lyrica [Pregabalin] Swelling and Other (See Comments)    Weight gain   Neurontin [Gabapentin] Other (See Comments)    Causes elevated LFTs    Current Outpatient Medications  Medication Sig Dispense Refill   methocarbamol (ROBAXIN) 500 MG tablet Take 1 tablet (500 mg total) by mouth 4 (four) times daily. 30 tablet 4    Accu-Chek Softclix Lancets lancets TEST BLOOD SUGAR UP TO 4 TIMES A DAY AS DIRECTED. R73.03 400 each 3   acyclovir ointment (ZOVIRAX) 5 % Apply 1 Application topically every 3 (three) hours as needed (outbreaks). 30 g 0   albuterol (VENTOLIN HFA) 108 (90 Base) MCG/ACT inhaler INHALE 2 PUFFS EVERY 6 HOURS AS NEEDED FOR SHORTNESS OF BREATH AND WHEEZING. 6.7 g 2   Alcohol Swabs (B-D SINGLE USE SWABS REGULAR) PADS USE 1 PAD DAILY WHEN CHECKING BLOOD SUGAR. R73.03 100 each 3   aspirin EC 325 MG tablet Take 1 tablet (325 mg total) by mouth daily. 30 tablet 0   blood glucose meter kit and supplies Dispense based on patient and insurance preference. Use up to four times daily as directed. (FOR ICD-10 E10.9, E11.9). 1 each 0   busPIRone (BUSPAR) 10 MG tablet Take 1 tablet (10 mg total) by mouth 2 (two) times daily as needed. (Patient taking differently: Take 10 mg by mouth 2 (two) times daily. (SCHEDULED)) 60 tablet 5   butalbital-acetaminophen-caffeine (FIORICET) 50-325-40 MG tablet take 1 tablet by mouth every 6 (six) hours as needed for headache. 20 tablet 0   carvedilol (COREG) 3.125 MG tablet TAKE 1/2 (0.5) TABLET TWICE A DAY WITH A MEAL; BLOOD PRESSURE OVER 150 BEFORE TAKING. 90 tablet 1   celecoxib (CELEBREX) 200 MG capsule Take 1 capsule (200 mg total) by mouth daily. 30 capsule 1   dexlansoprazole (DEXILANT) 60 MG capsule Take 1 capsule (60 mg total) by mouth daily. 90 capsule 1   diclofenac Sodium (VOLTAREN) 1 % GEL APPLY 4 GRAMS TOPICALLY 4 TIMES A DAY. (Patient taking differently: Apply 4 g topically 4 (four) times daily as needed (pain).) 100  g 0   DULoxetine (CYMBALTA) 60 MG capsule Take 1 capsule (60 mg total) by mouth at bedtime. 90 capsule 1   empagliflozin (JARDIANCE) 25 MG TABS tablet take 1 tablet (25 milligram total) by mouth daily before breakfast. 30 tablet 1   escitalopram (LEXAPRO) 20 MG tablet Take 1 tablet (20 mg total) by mouth daily. (Patient taking differently: Take 20 mg by  mouth at bedtime.) 90 tablet 1   estradiol (ESTRACE) 0.1 MG/GM vaginal cream Place 1 Applicatorful vaginally 3 (three) times a week. 42.5 g 3   FIBER PO Take 1 tablet by mouth in the morning and at bedtime.     fludrocortisone (FLORINEF) 0.1 MG tablet take 1 tablet ( total) by mouth every other day. 15 tablet 1   fluticasone (FLONASE) 50 MCG/ACT nasal spray USE 1 SPRAY IN EACH NOSTRIL ONCE DAILY. (Patient taking differently: Place 1 spray into both nostrils daily as needed for allergies.) 16 g 5   fluticasone furoate-vilanterol (BREO ELLIPTA) 100-25 MCG/ACT AEPB Inhale 1 puff into the lungs daily. (Patient taking differently: Inhale 1 puff into the lungs daily as needed (respiratory issues.).) 1 each 11   furosemide (LASIX) 40 MG tablet take 1 tablet daily as needed for excessive fluid. 30 tablet 1   Glucagon, rDNA, (GLUCAGON EMERGENCY) 1 MG KIT Inject 1 mg into the skin as needed. 1 kit 5   glucose blood (ACCU-CHEK GUIDE) test strip TEST BLOOD SUGAR UP TO 4 TIMES A DAY AS DIRECTED. R73.03 400 each 3   hydrALAZINE (APRESOLINE) 25 MG tablet TAKE 1 TABLET BY MOUTH 3 TIMES DAILY AS NEEDED (FOR SEVERE HYPERTENSION/SYSTOLIC NUMBER 170 OR GREATER). 90 tablet 5   HYDROcodone-acetaminophen (NORCO) 7.5-325 MG tablet Take 1 tablet by mouth in the morning, at noon, and at bedtime. 90 tablet 0   HYDROmorphone (DILAUDID) 2 MG tablet Take 1 tablet (2 mg total) by mouth every 4 (four) hours as needed for severe pain. 30 tablet 0   ipratropium (ATROVENT) 0.02 % nebulizer solution USE 1 VIAL ( ) IN NEBULIZER EVERY 6 HOURS AS NEEDED FOR WHEEZING OR SHORTNESS OF BREATH. 150 mL 2   isosorbide mononitrate (IMDUR) 60 MG 24 hr tablet Take 1 tablet (60 mg total) by mouth daily. (Patient taking differently: Take 90 mg by mouth in the morning.) 90 tablet 1   lamoTRIgine (LAMICTAL) 150 MG tablet take 1 tablet once daily. (Patient taking differently: Take 150 mg by mouth at bedtime.) 30 tablet 1   levETIRAcetam (KEPPRA)  500 MG tablet Take 1 tablet (500 mg total) by mouth 2 (two) times daily. 180 tablet 1   levocetirizine (XYZAL) 5 MG tablet TAKE 1 TABLET BY MOUTH EVERY MORNING. (Patient taking differently: Take 5 mg by mouth daily as needed for allergies.) 30 tablet 2   lidocaine (LIDODERM) 5 % Place 1 patch onto the skin daily. Remove & Discard patch within 12 hours or as directed by MD (Patient taking differently: Place 1 patch onto the skin daily as needed (pain.). Remove & Discard patch within 12 hours or as directed by MD) 30 patch 3   lidocaine (XYLOCAINE) 5 % ointment APPLY TO AFFECTED AREA 3 TIMES A DAY AS NEEDED FOR MILD OR MODERATE PAIN. 50 g 1   nitroGLYCERIN (NITROSTAT) 0.4 MG SL tablet place one (1) tablet under tongue every 5 minutes up to (3) doses as needed for chest pain. 25 tablet 1   ondansetron (ZOFRAN) 4 MG tablet take 1 tablet by mouth every 8 hours as needed for  nausea and vomiting. 20 tablet 0   oxybutynin (DITROPAN-XL) 5 MG 24 hr tablet Take 1 tablet (5 mg total) by mouth at bedtime. 90 tablet 1   oxyCODONE (ROXICODONE) 5 MG immediate release tablet Take 1 tablet (5 mg total) by mouth every 4 (four) hours as needed for severe pain or breakthrough pain. 30 tablet 0   Probiotic Product (PROBIOTIC PO) Take 1 capsule by mouth in the morning.     rosuvastatin (CRESTOR) 10 MG tablet Take 1 tablet (10 mg total) by mouth daily. 90 tablet 1   traZODone (DESYREL) 150 MG tablet Take 1 tablet (150 mg total) by mouth at bedtime. 90 tablet 1   valACYclovir (VALTREX) 1000 MG tablet Take 1 tablet (1,000 mg total) by mouth 3 (three) times daily as needed (with active outbreaks.). 21 tablet 0   valACYclovir (VALTREX) 500 MG tablet take (1) tablet twice daily. 60 tablet 1   No current facility-administered medications for this visit.   No results found.  Review of Systems:   A ROS was performed including pertinent positives and negatives as documented in the HPI.   Musculoskeletal Exam:    There were  no vitals taken for this visit.  Left hip incision is well-appearing without erythema or drainage.  Able to flex the hip to 90 degrees.  30 degrees internal/external rotation of the hip.  Improved hip abduction strength distal neurosensory exam is intact  Imaging:      I personally reviewed and interpreted the radiographs.   Assessment:   6 weeks status post left hip gluteus medius repair overall doing well.  She will continue to strengthen per protocol.  I will plan to see her back in 6 weeks for reassessment  Plan :    -Return to clinic 6 weeks for reassessment      I personally saw and evaluated the patient, and participated in the management and treatment plan.  Huel Cote, MD Attending Physician, Orthopedic Surgery  This document was dictated using Dragon voice recognition software. A reasonable attempt at proof reading has been made to minimize errors.

## 2022-12-17 NOTE — Telephone Encounter (Signed)
Note uploaded to [employer].

## 2022-12-19 ENCOUNTER — Encounter (HOSPITAL_BASED_OUTPATIENT_CLINIC_OR_DEPARTMENT_OTHER): Payer: Self-pay

## 2022-12-19 ENCOUNTER — Ambulatory Visit (HOSPITAL_BASED_OUTPATIENT_CLINIC_OR_DEPARTMENT_OTHER): Payer: 59 | Attending: Orthopaedic Surgery

## 2022-12-19 DIAGNOSIS — R2689 Other abnormalities of gait and mobility: Secondary | ICD-10-CM | POA: Diagnosis present

## 2022-12-19 DIAGNOSIS — M25552 Pain in left hip: Secondary | ICD-10-CM | POA: Insufficient documentation

## 2022-12-19 DIAGNOSIS — M25652 Stiffness of left hip, not elsewhere classified: Secondary | ICD-10-CM | POA: Diagnosis present

## 2022-12-19 NOTE — Therapy (Signed)
OUTPATIENT PHYSICAL THERAPY LOWER EXTREMITY TREATMENT   Patient Name: [name] MRN: [id number] DOB:11/25/1955, 67 y.o., female Today's Date: 12/19/2022  END OF SESSION:  PT End of Session - 12/19/22 0856     Visit Number 3    Number of Visits 16    Date for PT Re-Evaluation 01/15/23    PT Start Time 0855    PT Stop Time 0930    PT Time Calculation (min) 35 min    Activity Tolerance Patient tolerated treatment well    Behavior During Therapy [facility] for tasks assessed/performed               Past Medical History:  Diagnosis Date   Allergy    Anemia    Anginal pain (HCC)    last time    Anxiety    Arthritis    RHEUMATOID   Asthma    Bipolar 1 disorder (HCC)    Blood transfusion without reported diagnosis    x 3   Cataracts, bilateral 07/2017   CHF (congestive heart failure) (HCC)    COPD (chronic obstructive pulmonary disease) (HCC)    Coronary artery disease    reported hx of "MI";  Echo 2009 with normal LVF;  Myoview 05/2011: no ischemia   Depression    Diabetes mellitus without complication (HCC)    Dyslipidemia    Dysrhythmia    SVT   Elevated liver enzymes 06/30/2022   Esophageal stricture    Fibromyalgia    GERD (gastroesophageal reflux disease)    H/O hiatal hernia    Head injury, unspecified    Headache    migraines   Herpes simplex infection    History of kidney stones    History of loop recorder 08/10/2017   Managed by Dr. Sharrell Ku   Hyperlipidemia    Hypertension    Insomnia    Myocardial infarction [facility])    age 54   Osteoporosis    Pneumonia    hx   Seizures (HCC)    none in the last 3-4 years as of 11/03/22 per patient   Shortness of breath    Sleep apnea    mild, does not require positive pressure treatment. (02/03/22)   Spinal stenosis of lumbar region    Spondylolisthesis    Status post placement of implantable loop recorder    Supraventricular tachycardia    Syncope and collapse    s/p ILR; no arhythmogenic cause  identified   UTI (lower urinary tract infection)    Past Surgical History:  Procedure Laterality Date   BACK SURGERY     BREAST EXCISIONAL BIOPSY Right    1990s   BREAST SURGERY     lumpectomy   CARDIAC CATHETERIZATION  10/06/2011   CATARACT EXTRACTION W/PHACO Right 07/31/2017   Procedure: CATARACT EXTRACTION PHACO AND INTRAOCULAR LENS PLACEMENT (IOC);  Surgeon: Fabio Pierce, MD;  Location: [facility];  Service: Ophthalmology;  Laterality: Right;  CDE: 2.33   CATARACT EXTRACTION W/PHACO Left 08/14/2017   Procedure: CATARACT EXTRACTION PHACO AND INTRAOCULAR LENS PLACEMENT (IOC);  Surgeon: Fabio Pierce, MD;  Location: [facility];  Service: Ophthalmology;  Laterality: Left;  CDE: 2.74   CHOLECYSTECTOMY     CYSTOSCOPY     stone   DIAGNOSTIC LAPAROSCOPY     laparoscopic cholecystectomy   DOPPLER ECHOCARDIOGRAPHY  2009   ESOPHAGOGASTRODUODENOSCOPY N/A 10/31/2020   Procedure: ESOPHAGOGASTRODUODENOSCOPY (EGD);  Surgeon: Gaynelle Adu, MD;  Location: [facility];  Service: General;  Laterality: N/A;   EYE SURGERY Bilateral  08/14/2017   cataract removal   GLUTEUS MINIMUS REPAIR Left 11/04/2022   Procedure: LEFT GLUTEUS MEDIUS REPAIR;  Surgeon: Huel Cote, MD;  Location: [facility] OR;  Service: Orthopedics;  Laterality: Left;   head up tilt table testing  06/15/2007   [name]   HEMORRHOID SURGERY     HERNIA REPAIR     insertion of implatable loop recorder  08/11/2007   [name]   POSTERIOR CERVICAL FUSION/FORAMINOTOMY N/A 12/19/2013   Procedure: RIGHT C3-4.C4-5 AND C5-6 FORAMINOTOMIES;  Surgeon: Kerrin Champagne, MD;  Location: [facility] OR;  Service: Orthopedics;  Laterality: N/A;   TOTAL HIP ARTHROPLASTY Left 01/31/2020   Procedure: LEFT TOTAL HIP ARTHROPLASTY ANTERIOR APPROACH;  Surgeon: Kathryne Hitch, MD;  Location: [facility] OR;  Service: Orthopedics;  Laterality: Left;   TOTAL SHOULDER ARTHROPLASTY Right 11/15/2019   Procedure: RIGHT TOTAL SHOULDER ARTHROPLASTY;  Surgeon: Cammy Copa,  MD;  Location: [facility];  Service: Orthopedics;  Laterality: Right;   TOTAL SHOULDER REVISION Right 06/11/2021   Procedure: RIGHT SHOULDER CONVERSION TOTAL SHOULDER ARTHROPLASTY to REVERSE TOTAL SHOULDER ARTHROPLASTY;  Surgeon: Cammy Copa, MD;  Location: [facility] OR;  Service: Orthopedics;  Laterality: Right;   TUBAL LIGATION     UPPER GASTROINTESTINAL ENDOSCOPY     XI ROBOTIC ASSISTED HIATAL HERNIA REPAIR N/A 10/31/2020   Procedure: XI ROBOTIC ASSISTED HIATAL HERNIA REPAIR WITH PARTIAL FUNDOPLICATION;  Surgeon: Gaynelle Adu, MD;  Location: [facility];  Service: General;  Laterality: N/A;   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   Hypokalemia 11/25/2020   S/P laparoscopic fundoplication 10/31/2020   Status post total replacement of left hip 01/31/2020   Unilateral primary osteoarthritis, left hip 12/21/2019   S/P shoulder replacement, right 11/15/2019   Overweight (BMI 25.0-29.9) 04/15/2019   Diverticulosis 04/14/2019   Chronic diastolic CHF (congestive heart failure) (HCC) 04/14/2019   Unstable angina (HCC) 10/20/2017   Chronic pain 09/23/2016   Pre-diabetes 09/02/2016   Recurrent major depressive disorder, in partial remission (HCC) 07/14/2016   Seizures (HCC) 07/14/2016   GAD (generalized anxiety disorder) 07/14/2016   Insomnia 09/05/2014   Allergic rhinitis 09/05/2014   Cervical spondylosis without myelopathy 12/19/2013    Class: Chronic   Neural foraminal stenosis of cervical spine 12/19/2013   Cervical radiculitis 09/19/2013   Lumbosacral spondylosis without myelopathy 11/16/2012   Postlaminectomy syndrome, lumbar region 11/16/2012   Herpes simplex virus (HSV) infection 10/31/2008   Hyperlipidemia with target  LDL less than 100 10/31/2008   Essential hypertension 10/31/2008   GERD 10/31/2008   SPINAL STENOSIS OF LUMBAR REGION 10/31/2008   Myalgia 10/31/2008   Osteoporosis 10/31/2008   SPONDYLOLISTHESIS 10/31/2008   SYNCOPE 10/31/2008   Sleep apnea 10/31/2008    PCP: Bennie Pierini FNP   REFERRING PROVIDER: Dr Huel Cote   REFERRING DIAG:   THERAPY DIAG:  Pain in left hip  Stiffness of left hip, not elsewhere classified  Other abnormalities of gait and mobility  Rationale for Evaluation and Treatment: Rehabilitation  ONSET DATE: 11/04/222  SUBJECTIVE:   SUBJECTIVE STATEMENT: Pt reports soreness with minimal pain in anterolateral hip. "They only thing that bothers me is when I lay on it or go up stairs."  PERTINENT HISTORY: Migraines, osteopetrosis, anemia, history of angina,  anxiety, rheumatoid arthritis, asthma, bipolar disorder, bilateral cataracts, congestive heart failure, COPD, depression, diabetes, fibromyalgia, history of migraines, history of kidney stones, history of myocardial fraction, osteoporosis, history of seizures although none in the past 3 to 4 years, spinal stenosis with fusion, supraventricular tachycardia, syncope and collapse, cervical fusion, left total hip arthroplasty, right total shoulder arthroplasty with a revision, PAIN:  Are you having pain? Yes: NPRS scale: 3/10 right now 5/10 at worst  Pain location: anterior and lateral left hip  Pain description: aching  Aggravating factors: standing and walking  Relieving factors: pain medication   PRECAUTIONS: None  RED FLAGS: None   WEIGHT BEARING RESTRICTIONS: No  FALLS:  Has patient fallen in last 6 months? 2 falls; blacks out sometimes; fell a day after the surgery but on the other hip   LIVING ENVIRONMENT: 2 steps into the house and 15 to the upstairs OCCUPATION:  On disability   Hobbies: Church and playing guitar  PLOF: Independent  PATIENT GOALS:  Less pain   NEXT MD VISIT:   September 18th   OBJECTIVE:   DIAGNOSTIC FINDINGS:  Nothing post op   PATIENT SURVEYS:  FOTO    COGNITION: Overall cognitive status: Within functional limits for tasks assessed     SENSATION: Denies paresthesias   EDEMA:   MUSCLE LENGTH:   POSTURE: No Significant postural limitations  PALPATION: No expected unexpected tenderness palpation  LOWER EXTREMITY ROM:  MMT Right eval Left eval  Hip flexion    Hip extension    Hip abduction    Hip adduction    Hip internal rotation    Hip external rotation    Knee flexion    Knee extension    Ankle dorsiflexion    Ankle plantarflexion    Ankle inversion    Ankle eversion     (Blank rows = not tested) not performed secondary to recent surgery  LOWER EXTREMITY MMT:  Passive range of motion Right eval Left eval  Hip flexion  86 degrees  Hip extension    Hip abduction    Hip adduction    Hip internal rotation  3 degrees  Hip external rotation  Not performed per protocol  Knee flexion    Knee extension    Ankle dorsiflexion    Ankle plantarflexion    Ankle inversion    Ankle eversion     (Blank rows = not tested)    GAIT: Ambulating without assistive device.  Mild decrease in single-leg stance on right leg.  Mild trunk flexion with ambulation. TODAY'S TREATMENT:                                                                                                                              DATE:   PROM all planes STM anterior hip and lateral quad  Bridge 2x10 Hooklying ball squeeze 5" 2x10 Prone IR/ER x10ea Prone hip ext (attempted, but unable) Prone glute set 5" 2x10 HEP  Eval:Attempted to perform exercises per protocol.  Patient increased pain with quad set.  Exercise halted.  Attempted to perform posterior pelvic tilt.  Patient reports he is unable to perform secondary to spinal stenosis.  The patient is doing well patient advised just to walk at this time.  She reports she has tried stairs patient  strongly advised not to do stairs at this time.  She was advised to go up with her nonsurgical leg down with her surgical leg.   PATIENT EDUCATION:  Education details: HEP symptom management, progression of activity  Person educated: Patient Education method: Explanation, Demonstration, Tactile cues, Verbal cues, and Handouts Education comprehension: verbalized understanding, returned demonstration, verbal cues required, tactile cues required, and needs further education  HOME EXERCISE PROGRAM: Access Code: [email or web address] URL: [email or web address] Date: 12/19/2022 Prepared by: Riki Altes  Exercises - Heel Toe Raises with Counter Support  - 2 x daily - 7 x weekly - 3 sets - 10 reps - Supine Bridge  - 2 x daily - 7 x weekly - 2 sets - 10 reps - Prone Hip Internal Rotation AROM  - 1 x daily - 7 x weekly - 2 sets - 10 reps - Prone Hip External Rotation AROM  - 1 x daily - 7 x weekly - 2 sets - 10 reps - Prone Gluteal Sets  - 2 x daily - 7 x weekly - 2 sets - 10 reps - 5seconds hold  ASSESSMENT:  CLINICAL IMPRESSION: Pt over 6 weeks s/p. Pt with improved tolerance for exercise compared to previous sessions. She had MD visit this week with good report. Arrives without AD. Some pain at end rage movements with PROM, though overall good tolerance. Unable to compelte prone hip extension on either LE, possibly due to poor [facility]. Modified to prone glute sets. Provided pt with HEP to work on protocol appropriate exercises. Instructed pt to stop if any pain occurs. Pt to use ice prn for soreness. Tx time limited due to late arrival.    Eval: Patient is a 67 year old female status post left glute med repair 11/03/2020.  She is doing well at this time.  She is walking with a mild antalgic gait without an assistive device.  Her range of motion is within protocol limits without significant pain.  She has a significant past medical history including left hip replacement, RA, and lumbar spine  surgery.  She was advised at this time to continue walking at home.  Her pain is minimal at this time.  Exercises cause an exacerbation in pain even though there base exercises.  We will assess next week and add exercises according to protocol.  OBJECTIVE IMPAIRMENTS: Abnormal gait, decreased activity tolerance, decreased knowledge of condition, difficulty walking, decreased ROM, decreased strength, and pain.   ACTIVITY LIMITATIONS: carrying, lifting, bending, sitting, standing, squatting, stairs, bed mobility, and locomotion level  PARTICIPATION LIMITATIONS: meal prep, cleaning, laundry, shopping, community activity, yard work, and church  PERSONAL FACTORS: 3+ comorbidities: , Left hip replacement, lumbar spine surgery/fusion, right total shoulder replacement, history of unstable angina, some type of syncopal-like episodes that caused her to fall.  are also affecting patient's functional outcome.   REHAB POTENTIAL: Good  CLINICAL DECISION MAKING: Evolving/moderate complexity significant past medical history limiting her ability to perform exercises  EVALUATION COMPLEXITY: Moderate   GOALS: Goals reviewed with patient? Yes  SHORT TERM GOALS: Target date: 12/18/2022   Patient will demonstrate full range of motion within protocol limits at 4 weeks Baseline: Goal status: INITIAL  2.  Patient will ambulate 500 feet without increased pain Baseline:  Goal status: INITIAL  3.  Patient will transfer sit to stand without increased pain Baseline:  Goal status: INITIAL  4.  Patient will be independent with basic exercise protocol Baseline:  Goal status: INITIAL   LONG TERM GOALS: Target date: 01/15/2023    Patient will go up and down stairs with reciprocal pattern without increased pain Baseline:  Goal status: INITIAL  2.  Patient will ambulate community distances as tolerated without increased pain Baseline:  Goal status: INITIAL  3.  Patient will sit for greater than 30  minutes without increased pain Baseline:  Goal status: INITIAL   PLAN:  PT FREQUENCY: 1-2x/week  PT DURATION: 8 weeks  PLANNED INTERVENTIONS: Therapeutic exercises, Therapeutic activity, Neuromuscular re-education, Balance training, Gait training, Patient/Family education, Self Care, Joint mobilization, Stair training, DME instructions, Aquatic Therapy , Dry Needling, Electrical stimulation, Cryotherapy, Moist heat, Taping, Manual therapy, and Re-evaluation.   PLAN FOR NEXT SESSION:  Consider hip isometrics per protocol, continue with range of motion per protocol, continue to encourage patient to walk.   Donnel Saxon Gregoire Bennis, PTA 12/19/2022, 10:52 AM

## 2022-12-22 ENCOUNTER — Encounter: Payer: Self-pay | Admitting: Nurse Practitioner

## 2022-12-22 ENCOUNTER — Ambulatory Visit: Payer: 59 | Admitting: Nurse Practitioner

## 2022-12-22 VITALS — BP 104/71 | HR 69 | Temp 97.9°F | Ht 62.0 in | Wt 125.0 lb

## 2022-12-22 DIAGNOSIS — G894 Chronic pain syndrome: Secondary | ICD-10-CM

## 2022-12-22 DIAGNOSIS — F3341 Major depressive disorder, recurrent, in partial remission: Secondary | ICD-10-CM

## 2022-12-22 DIAGNOSIS — R569 Unspecified convulsions: Secondary | ICD-10-CM

## 2022-12-22 DIAGNOSIS — F5101 Primary insomnia: Secondary | ICD-10-CM

## 2022-12-22 DIAGNOSIS — G4733 Obstructive sleep apnea (adult) (pediatric): Secondary | ICD-10-CM

## 2022-12-22 DIAGNOSIS — I5032 Chronic diastolic (congestive) heart failure: Secondary | ICD-10-CM

## 2022-12-22 DIAGNOSIS — K5790 Diverticulosis of intestine, part unspecified, without perforation or abscess without bleeding: Secondary | ICD-10-CM | POA: Diagnosis not present

## 2022-12-22 DIAGNOSIS — I10 Essential (primary) hypertension: Secondary | ICD-10-CM

## 2022-12-22 DIAGNOSIS — E876 Hypokalemia: Secondary | ICD-10-CM

## 2022-12-22 DIAGNOSIS — Q762 Congenital spondylolisthesis: Secondary | ICD-10-CM

## 2022-12-22 DIAGNOSIS — R7303 Prediabetes: Secondary | ICD-10-CM

## 2022-12-22 DIAGNOSIS — E785 Hyperlipidemia, unspecified: Secondary | ICD-10-CM

## 2022-12-22 DIAGNOSIS — N941 Unspecified dyspareunia: Secondary | ICD-10-CM

## 2022-12-22 DIAGNOSIS — K2100 Gastro-esophageal reflux disease with esophagitis, without bleeding: Secondary | ICD-10-CM

## 2022-12-22 DIAGNOSIS — F411 Generalized anxiety disorder: Secondary | ICD-10-CM

## 2022-12-22 DIAGNOSIS — Z23 Encounter for immunization: Secondary | ICD-10-CM | POA: Diagnosis not present

## 2022-12-22 DIAGNOSIS — B009 Herpesviral infection, unspecified: Secondary | ICD-10-CM

## 2022-12-22 LAB — LIPID PANEL

## 2022-12-22 MED ORDER — HYDROCODONE-ACETAMINOPHEN 7.5-325 MG PO TABS: 1.0000 | ORAL_TABLET | Freq: Three times a day (TID) | ORAL | 0 refills | Status: DC

## 2022-12-22 MED ORDER — TRAZODONE HCL 150 MG PO TABS
150.0000 mg | ORAL_TABLET | Freq: Every day | ORAL | 1 refills | Status: DC
Start: 2022-12-22 — End: 2023-06-16

## 2022-12-22 MED ORDER — VALACYCLOVIR HCL 500 MG PO TABS: ORAL_TABLET | ORAL | 1 refills | Status: DC

## 2022-12-22 MED ORDER — ESTRADIOL 0.1 MG/GM VA CREA
1.0000 | TOPICAL_CREAM | VAGINAL | 3 refills | Status: DC
Start: 2022-12-22 — End: 2023-03-19

## 2022-12-22 MED ORDER — CARVEDILOL 3.125 MG PO TABS
ORAL_TABLET | ORAL | 1 refills | Status: DC
Start: 2022-12-22 — End: 2023-06-16

## 2022-12-22 MED ORDER — EMPAGLIFLOZIN 25 MG PO TABS
25.0000 mg | ORAL_TABLET | Freq: Every day | ORAL | 1 refills | Status: DC
Start: 2022-12-22 — End: 2023-06-16

## 2022-12-22 MED ORDER — DEXLANSOPRAZOLE 60 MG PO CPDR
1.0000 | DELAYED_RELEASE_CAPSULE | Freq: Every day | ORAL | 1 refills | Status: DC
Start: 2022-12-22 — End: 2023-06-16

## 2022-12-22 MED ORDER — METHOCARBAMOL 500 MG PO TABS: 500.0000 mg | ORAL_TABLET | Freq: Four times a day (QID) | ORAL | 4 refills | Status: DC

## 2022-12-22 MED ORDER — HYDROCODONE-ACETAMINOPHEN 7.5-325 MG PO TABS: 1.0000 | ORAL_TABLET | Freq: Three times a day (TID) | ORAL | 0 refills | Status: AC

## 2022-12-22 MED ORDER — LEVETIRACETAM 500 MG PO TABS
500.0000 mg | ORAL_TABLET | Freq: Two times a day (BID) | ORAL | 1 refills | Status: DC
Start: 2022-12-22 — End: 2023-06-16

## 2022-12-22 MED ORDER — FUROSEMIDE 40 MG PO TABS
ORAL_TABLET | ORAL | 1 refills | Status: DC
Start: 2022-12-22 — End: 2023-03-02

## 2022-12-22 MED ORDER — ROSUVASTATIN CALCIUM 10 MG PO TABS
10.0000 mg | ORAL_TABLET | Freq: Every day | ORAL | 1 refills | Status: DC
Start: 2022-12-22 — End: 2023-06-16

## 2022-12-22 MED ORDER — VALACYCLOVIR HCL 1 G PO TABS
1000.0000 mg | ORAL_TABLET | Freq: Three times a day (TID) | ORAL | 0 refills | Status: DC | PRN
Start: 2022-12-22 — End: 2023-03-19

## 2022-12-22 MED ORDER — BUSPIRONE HCL 10 MG PO TABS
10.0000 mg | ORAL_TABLET | Freq: Two times a day (BID) | ORAL | 5 refills | Status: DC | PRN
Start: 2022-12-22 — End: 2023-06-16

## 2022-12-22 MED ORDER — ESCITALOPRAM OXALATE 20 MG PO TABS
20.0000 mg | ORAL_TABLET | Freq: Every day | ORAL | 1 refills | Status: DC
Start: 2022-12-22 — End: 2023-06-16

## 2022-12-22 MED ORDER — LAMOTRIGINE 150 MG PO TABS
150.0000 mg | ORAL_TABLET | Freq: Every day | ORAL | 1 refills | Status: DC
Start: 2022-12-22 — End: 2023-03-02

## 2022-12-22 MED ORDER — DULOXETINE HCL 60 MG PO CPEP
60.0000 mg | ORAL_CAPSULE | Freq: Every day | ORAL | 1 refills | Status: DC
Start: 2022-12-22 — End: 2023-06-16

## 2022-12-22 NOTE — Patient Instructions (Signed)
Chronic Pain, Adult Chronic pain is a type of pain that lasts or keeps coming back for at least 3-6 months. You may have headaches, pain in the abdomen, or pain in other areas of the body. Chronic pain may be related to an illness, injury, or a health condition. Sometimes, the cause of chronic pain is not known. Chronic pain can make it hard for you to do daily activities. If it is not treated, chronic pain can lead to anxiety and depression. Treatment depends on the cause of your pain and how severe it is. You may need to work with a pain specialist to come up with a treatment plan. Many people benefit from two or more types of treatment to control their pain. Follow these instructions at home: Treatment plan Follow your treatment plan as told by your health care provider. This may include: Gentle, regular exercise. Eating a healthy diet that includes foods such as vegetables, fruits, fish, and lean meats. Mental health therapy (cognitive or behavioral therapy) that changes the way you think or act in response to the pain. This may help improve how you feel. Doing physical therapy exercises to improve movement and strength. Meditation, yoga, acupuncture, or massage therapy. Using the oils from plants in your environment or on your skin (aromatherapy). Other treatments may include: Over-the-counter or prescription medicines. Color, light, or sound therapy. Local electrical stimulation. The electrical pulses help to relieve pain by temporarily stopping the nerve impulses that cause you to feel pain. Injections. These deliver numbing or pain-relieving medicines into the spine or the area of pain.  Medicines Take over-the-counter and prescription medicines only as told by your health care provider. Ask your health care provider if the medicine prescribed to you: Requires you to avoid driving or using machinery. Can cause constipation. You may need to take these actions to prevent or treat  constipation: Drink enough fluid to keep your urine pale yellow. Take over-the-counter or prescription medicines. Eat foods that are high in fiber, such as beans, whole grains, and fresh fruits and vegetables. Limit foods that are high in fat and processed sugars, such as fried or sweet foods. Lifestyle  Ask your health care provider whether you should keep a pain diary. Your health care provider will tell you what information to write in the diary. This may include: When you have pain. What the pain feels like. How medicines and other behaviors or treatments help to reduce the pain. Consider talking with a mental health care provider about how to help manage chronic pain. Consider joining a chronic pain support group. Try to control or lower your stress levels. Talk with your health care provider about ways to do this. General instructions Learn as much as you can about how to manage your chronic pain. Ask your health care provider if an intensive pain rehabilitation program or a chronic pain specialist would be helpful. Check your pain level as told by your health care provider. Ask your health care provider if you should use a pain scale. Contact a health care provider if: Your pain is not controlled with treatment. You have new pain. You have side effects from pain medicine. You feel weak or you have trouble doing your normal activities. You have trouble sleeping or you develop confusion. You lose feeling or have numbness in your body. You lose control of your bowels or bladder. Get help right away if: Your pain suddenly gets much worse. You develop chest pain. You have trouble breathing or shortness of  breath. You faint, or another person sees you faint. These symptoms may be an emergency. Get help right away. Call 911. Do not wait to see if the symptoms will go away. Do not drive yourself to the hospital. Also, get help right away if: You have thoughts about hurting yourself  or others. Take one of these steps if you feel like you may hurt yourself or others, or have thoughts about taking your own life: Go to your nearest emergency room. Call 911. Call the National Suicide Prevention Lifeline at 613-055-8912 or 988. This is open 24 hours a day. Text the Crisis Text Line at 323-193-1954. This information is not intended to replace advice given to you by your health care provider. Make sure you discuss any questions you have with your health care provider. Document Revised: 11/06/2021 Document Reviewed: 10/09/2021 Elsevier Patient Education  2024 [employer].

## 2022-12-22 NOTE — Progress Notes (Signed)
Subjective:    Patient ID: [name], female    DOB: Nov 23, 1955, 67 y.o.   MRN: [id number]   Chief Complaint: medical management of chronic issues     HPI:  [name] is a 67 y.o. who identifies as a female who was assigned female at birth.   Social history: Lives with: her niece stays with her alot Work history: disability   Comes in today for follow up of the following chronic medical issues:  1. Essential hypertension No c/o chest pain, sob or headache. Does not check blood pressure at home very often. BP Readings from Last 3 Encounters:  12/03/22 123/80  11/26/22 112/67  11/04/22 (!) 151/85     2. Chronic diastolic CHF (congestive heart failure) (HCC) Last saw cardiology on 09/22/22. Review of office note- referral to autonomic specialist at [facility] to determine cause of syncopal episodes. They changed her florinef 0.1mg  every other day. Continue imdur at 60mg   for chest pain.  3. Obstructive sleep apnea syndrome Does not wear CPAP daily  4. Diverticulosis No flare ups since her last follow up  5. Gastroesophageal reflux disease with esophagitis without hemorrhage Is on dexilant daily and is doing well right now  6. Hyperlipidemia with target LDL less than 100 Does not really watch diet and does no dedicated exercise. Lab Results  Component Value Date   CHOL 160 06/26/2022   HDL 63 06/26/2022   LDLCALC 76 06/26/2022   LDLDIRECT 142.1 04/22/2007   TRIG 117 06/26/2022   CHOLHDL 2.5 06/26/2022    7. Hypokalemia Denies any muscle cramps Lab Results  Component Value Date   K 3.1 (L) 11/04/2022     8. GAD (generalized anxiety disorder) Is on lexapro and cymbalta combination. Ays she has been doing ok    12/22/2022    3:03 PM 12/03/2022   10:12 AM 11/26/2022    8:54 AM 09/23/2022    2:56 PM  GAD 7 : Generalized Anxiety Score  Nervous, Anxious, on Edge 0 0 0 0  Control/stop worrying 0 0 0 0  Worry too much - different things 0 0 0 0   Trouble relaxing 0 0 0 0  Restless 0 0 0 0  Easily annoyed or irritable 0 0 0 0  Afraid - awful might happen 0 0 0 0  Total GAD 7 Score 0 0 0 0  Anxiety Difficulty Not difficult at all Not difficult at all Not difficult at all Not difficult at all      9. Seizures (HCC) Is still on keppra- no recent seizure activity  10. Recurrent major depressive disorder, in partial remission (HCC) Again is on cymbalta and lexapro.    12/22/2022    3:03 PM 12/03/2022   10:12 AM 11/26/2022    8:54 AM  Depression screen PHQ 2/9  Decreased Interest 0 0 0  Down, Depressed, Hopeless 0 0 0  PHQ - 2 Score 0 0 0  Altered sleeping 0 0 0  Tired, decreased energy 2 2 0  Change in appetite 0  0  Feeling bad or failure about yourself  0 0 0  Trouble concentrating 2 2 2   Moving slowly or fidgety/restless 0 0 0  Suicidal thoughts 0 0 0  PHQ-9 Score 4 4 2   Difficult doing work/chores Not difficult at all Not difficult at all Not difficult at all     11. Chronic pain syndrome She is on norco 7.5/325 TID. She has had dilaudid and oxycodone  after surgeries recently, but I was informed of this prior to surgery.  12. Primary insomnia Is on no sleep aids due to taking pain medication. Sleeps well some nights and not so well other nights.   New complaints: None today  Allergies  Allergen Reactions   Codeine Other (See Comments)    "I will have a heart attack."   Morphine And Codeine Other (See Comments)    "It will cause me to have a heart attack."   Ambien [Zolpidem Tartrate] Nausea And Vomiting   Clonidine Derivatives Nausea And Vomiting    gerd - caused acid reflux per pt   Metformin And Related Nausea And Vomiting    cramping from metformin   Lyrica [Pregabalin] Swelling and Other (See Comments)    Weight gain   Neurontin [Gabapentin] Other (See Comments)    Causes elevated LFTs    Outpatient Encounter Medications as of 12/22/2022  Medication Sig   Accu-Chek Softclix Lancets lancets TEST  BLOOD SUGAR UP TO 4 TIMES A DAY AS DIRECTED. R73.03   acyclovir ointment (ZOVIRAX) 5 % Apply 1 Application topically every 3 (three) hours as needed (outbreaks).   albuterol (VENTOLIN HFA) 108 (90 Base) MCG/ACT inhaler INHALE 2 PUFFS EVERY 6 HOURS AS NEEDED FOR SHORTNESS OF BREATH AND WHEEZING.   Alcohol Swabs (B-D SINGLE USE SWABS REGULAR) PADS USE 1 PAD DAILY WHEN CHECKING BLOOD SUGAR. R73.03   aspirin EC 325 MG tablet Take 1 tablet (325 mg total) by mouth daily.   blood glucose meter kit and supplies Dispense based on patient and insurance preference. Use up to four times daily as directed. (FOR ICD-10 E10.9, E11.9).   busPIRone (BUSPAR) 10 MG tablet Take 1 tablet (10 mg total) by mouth 2 (two) times daily as needed. (Patient taking differently: Take 10 mg by mouth 2 (two) times daily. (SCHEDULED))   butalbital-acetaminophen-caffeine (FIORICET) 50-325-40 MG tablet take 1 tablet by mouth every 6 (six) hours as needed for headache.   carvedilol (COREG) 3.125 MG tablet TAKE 1/2 (0.5) TABLET TWICE A DAY WITH A MEAL; BLOOD PRESSURE OVER 150 BEFORE TAKING.   celecoxib (CELEBREX) 200 MG capsule Take 1 capsule (200 mg total) by mouth daily.   dexlansoprazole (DEXILANT) 60 MG capsule Take 1 capsule (60 mg total) by mouth daily.   diclofenac Sodium (VOLTAREN) 1 % GEL APPLY 4 GRAMS TOPICALLY 4 TIMES A DAY. (Patient taking differently: Apply 4 g topically 4 (four) times daily as needed (pain).)   DULoxetine (CYMBALTA) 60 MG capsule Take 1 capsule (60 mg total) by mouth at bedtime.   empagliflozin (JARDIANCE) 25 MG TABS tablet take 1 tablet (25 milligram total) by mouth daily before breakfast.   escitalopram (LEXAPRO) 20 MG tablet Take 1 tablet (20 mg total) by mouth daily. (Patient taking differently: Take 20 mg by mouth at bedtime.)   estradiol (ESTRACE) 0.1 MG/GM vaginal cream Place 1 Applicatorful vaginally 3 (three) times a week.   FIBER PO Take 1 tablet by mouth in the morning and at bedtime.    fludrocortisone (FLORINEF) 0.1 MG tablet take 1 tablet ( total) by mouth every other day.   fluticasone (FLONASE) 50 MCG/ACT nasal spray USE 1 SPRAY IN EACH NOSTRIL ONCE DAILY. (Patient taking differently: Place 1 spray into both nostrils daily as needed for allergies.)   fluticasone furoate-vilanterol (BREO ELLIPTA) 100-25 MCG/ACT AEPB Inhale 1 puff into the lungs daily. (Patient taking differently: Inhale 1 puff into the lungs daily as needed (respiratory issues.).)   furosemide (LASIX) 40 MG  tablet take 1 tablet daily as needed for excessive fluid.   Glucagon, rDNA, (GLUCAGON EMERGENCY) 1 MG KIT Inject 1 mg into the skin as needed.   glucose blood (ACCU-CHEK GUIDE) test strip TEST BLOOD SUGAR UP TO 4 TIMES A DAY AS DIRECTED. R73.03   hydrALAZINE (APRESOLINE) 25 MG tablet TAKE 1 TABLET BY MOUTH 3 TIMES DAILY AS NEEDED (FOR SEVERE HYPERTENSION/SYSTOLIC NUMBER 170 OR GREATER).   HYDROcodone-acetaminophen (NORCO) 7.5-325 MG tablet Take 1 tablet by mouth in the morning, at noon, and at bedtime.   HYDROmorphone (DILAUDID) 2 MG tablet Take 1 tablet (2 mg total) by mouth every 4 (four) hours as needed for severe pain.   ipratropium (ATROVENT) 0.02 % nebulizer solution USE 1 VIAL ( ) IN NEBULIZER EVERY 6 HOURS AS NEEDED FOR WHEEZING OR SHORTNESS OF BREATH.   isosorbide mononitrate (IMDUR) 60 MG 24 hr tablet Take 1 tablet (60 mg total) by mouth daily. (Patient taking differently: Take 90 mg by mouth in the morning.)   lamoTRIgine (LAMICTAL) 150 MG tablet take 1 tablet once daily. (Patient taking differently: Take 150 mg by mouth at bedtime.)   levETIRAcetam (KEPPRA) 500 MG tablet Take 1 tablet (500 mg total) by mouth 2 (two) times daily.   levocetirizine (XYZAL) 5 MG tablet TAKE 1 TABLET BY MOUTH EVERY MORNING. (Patient taking differently: Take 5 mg by mouth daily as needed for allergies.)   lidocaine (LIDODERM) 5 % Place 1 patch onto the skin daily. Remove & Discard patch within 12 hours or as  directed by MD (Patient taking differently: Place 1 patch onto the skin daily as needed (pain.). Remove & Discard patch within 12 hours or as directed by MD)   lidocaine (XYLOCAINE) 5 % ointment APPLY TO AFFECTED AREA 3 TIMES A DAY AS NEEDED FOR MILD OR MODERATE PAIN.   methocarbamol (ROBAXIN) 500 MG tablet Take 1 tablet (500 mg total) by mouth 4 (four) times daily.   nitroGLYCERIN (NITROSTAT) 0.4 MG SL tablet place one (1) tablet under tongue every 5 minutes up to (3) doses as needed for chest pain.   ondansetron (ZOFRAN) 4 MG tablet take 1 tablet by mouth every 8 hours as needed for nausea and vomiting.   oxybutynin (DITROPAN-XL) 5 MG 24 hr tablet Take 1 tablet (5 mg total) by mouth at bedtime.   oxyCODONE (ROXICODONE) 5 MG immediate release tablet Take 1 tablet (5 mg total) by mouth every 4 (four) hours as needed for severe pain or breakthrough pain.   Probiotic Product (PROBIOTIC PO) Take 1 capsule by mouth in the morning.   rosuvastatin (CRESTOR) 10 MG tablet Take 1 tablet (10 mg total) by mouth daily.   traZODone (DESYREL) 150 MG tablet Take 1 tablet (150 mg total) by mouth at bedtime.   valACYclovir (VALTREX) 1000 MG tablet Take 1 tablet (1,000 mg total) by mouth 3 (three) times daily as needed (with active outbreaks.).   valACYclovir (VALTREX) 500 MG tablet take (1) tablet twice daily.   No facility-administered encounter medications on file as of 12/22/2022.    Past Surgical History:  Procedure Laterality Date   BACK SURGERY     BREAST EXCISIONAL BIOPSY Right    1990s   BREAST SURGERY     lumpectomy   CARDIAC CATHETERIZATION  10/06/2011   CATARACT EXTRACTION W/PHACO Right 07/31/2017   Procedure: CATARACT EXTRACTION PHACO AND INTRAOCULAR LENS PLACEMENT (IOC);  Surgeon: Fabio Pierce, MD;  Location: [facility];  Service: Ophthalmology;  Laterality: Right;  CDE: 2.33   CATARACT EXTRACTION  W/PHACO Left 08/14/2017   Procedure: CATARACT EXTRACTION PHACO AND INTRAOCULAR LENS PLACEMENT (IOC);   Surgeon: Fabio Pierce, MD;  Location: [facility];  Service: Ophthalmology;  Laterality: Left;  CDE: 2.74   CHOLECYSTECTOMY     CYSTOSCOPY     stone   DIAGNOSTIC LAPAROSCOPY     laparoscopic cholecystectomy   DOPPLER ECHOCARDIOGRAPHY  2009   ESOPHAGOGASTRODUODENOSCOPY N/A 10/31/2020   Procedure: ESOPHAGOGASTRODUODENOSCOPY (EGD);  Surgeon: Gaynelle Adu, MD;  Location: [facility];  Service: General;  Laterality: N/A;   EYE SURGERY Bilateral 08/14/2017   cataract removal   GLUTEUS MINIMUS REPAIR Left 11/04/2022   Procedure: LEFT GLUTEUS MEDIUS REPAIR;  Surgeon: Huel Cote, MD;  Location: [facility];  Service: Orthopedics;  Laterality: Left;   head up tilt table testing  06/15/2007   [name]   HEMORRHOID SURGERY     HERNIA REPAIR     insertion of implatable loop recorder  08/11/2007   [name]   POSTERIOR CERVICAL FUSION/FORAMINOTOMY N/A 12/19/2013   Procedure: RIGHT C3-4.C4-5 AND C5-6 FORAMINOTOMIES;  Surgeon: Kerrin Champagne, MD;  Location: [facility];  Service: Orthopedics;  Laterality: N/A;   TOTAL HIP ARTHROPLASTY Left 01/31/2020   Procedure: LEFT TOTAL HIP ARTHROPLASTY ANTERIOR APPROACH;  Surgeon: Kathryne Hitch, MD;  Location: [facility] OR;  Service: Orthopedics;  Laterality: Left;   TOTAL SHOULDER ARTHROPLASTY Right 11/15/2019   Procedure: RIGHT TOTAL SHOULDER ARTHROPLASTY;  Surgeon: Cammy Copa, MD;  Location: [facility];  Service: Orthopedics;  Laterality: Right;   TOTAL SHOULDER REVISION Right 06/11/2021   Procedure: RIGHT SHOULDER CONVERSION TOTAL SHOULDER ARTHROPLASTY to REVERSE TOTAL SHOULDER ARTHROPLASTY;  Surgeon: Cammy Copa, MD;  Location: [facility] OR;  Service: Orthopedics;  Laterality: Right;   TUBAL LIGATION     UPPER GASTROINTESTINAL ENDOSCOPY     XI ROBOTIC ASSISTED HIATAL HERNIA REPAIR N/A 10/31/2020   Procedure: XI ROBOTIC ASSISTED HIATAL HERNIA REPAIR WITH PARTIAL FUNDOPLICATION;  Surgeon: Gaynelle Adu, MD;  Location: [facility];  Service: General;  Laterality: N/A;     Family History  Problem Relation Age of Onset   Heart attack Father    Mental illness Father    Mental illness Mother    Heart attack Brother        stents   Alcohol abuse Brother    Heart disease Brother    Drug abuse Brother    Diabetes Brother    Colon cancer Maternal Aunt    Cirrhosis Brother    Stomach cancer Neg Hx    Esophageal cancer Neg Hx    Rectal cancer Neg Hx       Controlled substance contract: 04/02/22     Review of Systems  Constitutional:  Negative for diaphoresis.  Eyes:  Negative for pain.  Respiratory:  Negative for shortness of breath.   Cardiovascular:  Negative for chest pain, palpitations and leg swelling.  Gastrointestinal:  Negative for abdominal pain.  Endocrine: Negative for polydipsia.  Skin:  Negative for rash.  Neurological:  Negative for dizziness, weakness and headaches.  Hematological:  Does not bruise/bleed easily.  All other systems reviewed and are negative.      Objective:   Physical Exam Vitals and nursing note reviewed.  Constitutional:      General: She is not in acute distress.    Appearance: Normal appearance. She is well-developed.  HENT:     Head: Normocephalic.     Right Ear: Tympanic membrane normal.     Left Ear: Tympanic membrane normal.  Nose: Nose normal.     Mouth/Throat:     Mouth: Mucous membranes are moist.  Eyes:     Pupils: Pupils are equal, round, and reactive to light.  Neck:     Vascular: No carotid bruit or JVD.  Cardiovascular:     Rate and Rhythm: Normal rate and regular rhythm.     Heart sounds: Normal heart sounds.  Pulmonary:     Effort: Pulmonary effort is normal. No respiratory distress.     Breath sounds: Normal breath sounds. No wheezing or rales.  Chest:     Chest wall: No tenderness.  Abdominal:     General: Bowel sounds are normal. There is no distension or abdominal bruit.     Palpations: Abdomen is soft. There is no hepatomegaly, splenomegaly, mass or pulsatile mass.      Tenderness: There is no abdominal tenderness.  Musculoskeletal:        General: Normal range of motion.     Cervical back: Normal range of motion and neck supple.     Comments: Moves slowly from sitting to standing Gait slow and steady  Lymphadenopathy:     Cervical: No cervical adenopathy.  Skin:    General: Skin is warm and dry.  Neurological:     Mental Status: She is alert and oriented to person, place, and time.     Deep Tendon Reflexes: Reflexes are normal and symmetric.  Psychiatric:        Behavior: Behavior normal.        Thought Content: Thought content normal.        Judgment: Judgment normal.    BP 104/71   Pulse 69   Temp 97.9 F (36.6 C) (Temporal)   Ht 5\' 2"  (1.575 m)   Wt 125 lb (56.7 kg)   SpO2 93%   BMI 22.86 kg/m         Assessment & Plan:  [name] comes in today with chief complaint of No chief complaint on file.   Diagnosis and orders addressed:  1. Essential hypertension Low sodium diet - carvedilol (COREG) 3.125 MG tablet; TAKE 1/2 (0.5) TABLET TWICE A DAY WITH A MEAL; BLOOD PRESSURE OVER 150 BEFORE TAKING.  Dispense: 90 tablet; Refill: 1 - furosemide (LASIX) 40 MG tablet; TAKE 1 TABLET DAILY AS NEEDED FOR EXCESSIVE FLUID.  Dispense: 30 tablet; Refill: 1  2. Chronic diastolic CHF (congestive heart failure) (HCC) Keep follow up with cardiology  3. Obstructive sleep apnea syndrome Wear cpapa  4. Diverticulosis Watch diet to prevent flare up  5. Gastroesophageal reflux disease with esophagitis without hemorrhage Avoid spicy foods Do not eat 2 hours prior to bedtime - dexlansoprazole (DEXILANT) 60 MG capsule; Take 1 capsule (60 mg total) by mouth daily.  Dispense: 90 capsule; Refill: 1  6. Hyperlipidemia with target LDL less than 100 Low ftadiet - rosuvastatin (CRESTOR) 10 MG tablet; Take 1 tablet (10 mg total) by mouth daily.  Dispense: 90 tablet; Refill: 1  7. Hypokalemia Labs pending  8. GAD (generalized anxiety  disorder) Stress management - lamoTRIgine (LAMICTAL) 150 MG tablet; Take 1 tablet (150 mg total) by mouth daily.  Dispense: 30 tablet; Refill: 1  9. Seizures (HCC) Report any seizure activity - levETIRAcetam (KEPPRA) 500 MG tablet; Take 1 tablet (500 mg total) by mouth 2 (two) times daily.  Dispense: 180 tablet; Refill: 1  10. Recurrent major depressive disorder, in partial remission (HCC) - busPIRone (BUSPAR) 10 MG tablet; Take 1 tablet (10 mg total) by  mouth 2 (two) times daily as needed.  Dispense: 60 tablet; Refill: 5 - DULoxetine (CYMBALTA) 60 MG capsule; Take 1 capsule (60 mg total) by mouth at bedtime.  Dispense: 90 capsule; Refill: 1 - escitalopram (LEXAPRO) 20 MG tablet; Take 1 tablet (20 mg total) by mouth daily.  Dispense: 90 tablet; Refill: 1  11. Chronic pain syndrome  12. Primary insomnia Bedtime routine - traZODone (DESYREL) 150 MG tablet; Take 1 tablet (150 mg total) by mouth at bedtime.  Dispense: 90 tablet; Refill: 1  13. Dyspareunia in female - estradiol (ESTRACE) 0.1 MG/GM vaginal cream; Place 1 Applicatorful vaginally 3 (three) times a week.  Dispense: 42.5 g; Refill: 3  14. Herpes simplex virus (HSV) infection - valACYclovir (VALTREX) 1000 MG tablet; Take 1 tablet (1,000 mg total) by mouth 3 (three) times daily as needed (with active outbreaks.).  Dispense: 21 tablet; Refill: 0  15. Pre-diabetes Watch carbs in diet - empagliflozin (JARDIANCE) 25 MG TABS tablet; Take 1 tablet (25 mg total) by mouth daily.  Dispense: 90 tablet; Refill: 1  16. SPONDYLOLISTHESIS - HYDROcodone-acetaminophen (NORCO) 7.5-325 MG tablet; Take 1 tablet by mouth in the morning, at noon, and at bedtime.  Dispense: 90 tablet; Refill: 0 - HYDROcodone-acetaminophen (NORCO) 7.5-325 MG tablet; Take 1 tablet by mouth in the morning, at noon, and at bedtime.  Dispense: 90 tablet; Refill: 0 - HYDROcodone-acetaminophen (NORCO) 7.5-325 MG tablet; Take 1 tablet by mouth in the morning, at noon, and at  bedtime.  Dispense: 90 tablet; Refill: 0   Labs pending Health Maintenance reviewed Diet and exercise encouraged  Follow up plan: 3 months- pain management   [name] Daphine Deutscher, FNP

## 2022-12-22 NOTE — Addendum Note (Signed)
Addended by: Cleda Daub on: 12/22/2022 04:30 PM   Modules accepted: Orders

## 2022-12-23 ENCOUNTER — Encounter (HOSPITAL_BASED_OUTPATIENT_CLINIC_OR_DEPARTMENT_OTHER): Payer: Self-pay | Admitting: Physical Therapy

## 2022-12-23 ENCOUNTER — Ambulatory Visit (HOSPITAL_BASED_OUTPATIENT_CLINIC_OR_DEPARTMENT_OTHER): Payer: 59 | Admitting: Physical Therapy

## 2022-12-23 DIAGNOSIS — M25652 Stiffness of left hip, not elsewhere classified: Secondary | ICD-10-CM

## 2022-12-23 DIAGNOSIS — M25552 Pain in left hip: Secondary | ICD-10-CM | POA: Diagnosis not present

## 2022-12-23 DIAGNOSIS — R2689 Other abnormalities of gait and mobility: Secondary | ICD-10-CM

## 2022-12-23 LAB — CBC WITH DIFFERENTIAL/PLATELET
Basophils Absolute: 0 10*3/uL (ref 0.0–0.2)
Basos: 0 %
EOS (ABSOLUTE): 0.1 10*3/uL (ref 0.0–0.4)
Eos: 3 %
Hematocrit: 43.7 % (ref 34.0–46.6)
Hemoglobin: 14.2 g/dL (ref 11.1–15.9)
Immature Grans (Abs): 0 10*3/uL (ref 0.0–0.1)
Immature Granulocytes: 0 %
Lymphocytes Absolute: 1.9 10*3/uL (ref 0.7–3.1)
Lymphs: 38 %
MCH: 31.7 pg (ref 26.6–33.0)
MCHC: 32.5 g/dL (ref 31.5–35.7)
MCV: 98 fL — ABNORMAL HIGH (ref 79–97)
Monocytes Absolute: 0.4 10*3/uL (ref 0.1–0.9)
Monocytes: 9 %
Neutrophils Absolute: 2.5 10*3/uL (ref 1.4–7.0)
Neutrophils: 50 %
Platelets: 154 10*3/uL (ref 150–450)
RBC: 4.48 x10E6/uL (ref 3.77–5.28)
RDW: 12.4 % (ref 11.7–15.4)
WBC: 4.9 10*3/uL (ref 3.4–10.8)

## 2022-12-23 LAB — CMP14+EGFR
ALT: 117 IU/L — ABNORMAL HIGH (ref 0–32)
AST: 81 IU/L — ABNORMAL HIGH (ref 0–40)
Albumin: 3.9 g/dL (ref 3.9–4.9)
Alkaline Phosphatase: 151 IU/L — ABNORMAL HIGH (ref 44–121)
BUN/Creatinine Ratio: 17 (ref 12–28)
BUN: 11 mg/dL (ref 8–27)
Bilirubin Total: 0.2 mg/dL (ref 0.0–1.2)
CO2: 22 mmol/L (ref 20–29)
Calcium: 9.4 mg/dL (ref 8.7–10.3)
Chloride: 106 mmol/L (ref 96–106)
Creatinine, Ser: 0.64 mg/dL (ref 0.57–1.00)
Globulin, Total: 1.9 g/dL (ref 1.5–4.5)
Glucose: 111 mg/dL — ABNORMAL HIGH (ref 70–99)
Potassium: 4.5 mmol/L (ref 3.5–5.2)
Sodium: 143 mmol/L (ref 134–144)
Total Protein: 5.8 g/dL — ABNORMAL LOW (ref 6.0–8.5)
eGFR: 97 mL/min/{1.73_m2} (ref >59)

## 2022-12-23 LAB — LIPID PANEL
Cholesterol, Total: 127 mg/dL (ref 100–199)
HDL: 45 mg/dL (ref >39)
LDL CALC COMMENT:: 2.8 ratio (ref 0.0–4.4)
LDL Chol Calc (NIH): 64 mg/dL (ref 0–99)
Triglycerides: 96 mg/dL (ref 0–149)
VLDL Cholesterol Cal: 18 mg/dL (ref 5–40)

## 2022-12-23 NOTE — Therapy (Signed)
OUTPATIENT PHYSICAL THERAPY LOWER EXTREMITY TREATMENT   Patient Name: [name] MRN: [id number] DOB:10/29/1955, 67 y.o., female Today's Date: 12/23/2022  END OF SESSION:  PT End of Session - 12/23/22 1259     Visit Number 4    Number of Visits 16    Date for PT Re-Evaluation 01/15/23    PT Start Time 1033    PT Stop Time 1115    PT Time Calculation (min) 42 min    Activity Tolerance Patient tolerated treatment well    Behavior During Therapy WFL for tasks assessed/performed                Past Medical History:  Diagnosis Date   Allergy    Anemia    Anginal pain (HCC)    last time    Anxiety    Arthritis    RHEUMATOID   Asthma    Bipolar 1 disorder (HCC)    Blood transfusion without reported diagnosis    x 3   Cataracts, bilateral 07/2017   CHF (congestive heart failure) (HCC)    COPD (chronic obstructive pulmonary disease) (HCC)    Coronary artery disease    reported hx of "MI";  Echo 2009 with normal LVF;  Myoview 05/2011: no ischemia   Depression    Diabetes mellitus without complication (HCC)    Dyslipidemia    Dysrhythmia    SVT   Elevated liver enzymes 06/30/2022   Esophageal stricture    Fibromyalgia    GERD (gastroesophageal reflux disease)    H/O hiatal hernia    Head injury, unspecified    Headache    migraines   Herpes simplex infection    History of kidney stones    History of loop recorder 08/10/2017   Managed by Dr. Sharrell Ku   Hyperlipidemia    Hypertension    Insomnia    Myocardial infarction [facility])    age 45   Osteoporosis    Pneumonia    hx   Seizures (HCC)    none in the last 3-4 years as of 11/03/22 per patient   Shortness of breath    Sleep apnea    mild, does not require positive pressure treatment. (02/03/22)   Spinal stenosis of lumbar region    Spondylolisthesis    Status post placement of implantable loop recorder    Supraventricular tachycardia    Syncope and collapse    s/p ILR; no arhythmogenic cause  identified   UTI (lower urinary tract infection)    Past Surgical History:  Procedure Laterality Date   BACK SURGERY     BREAST EXCISIONAL BIOPSY Right    1990s   BREAST SURGERY     lumpectomy   CARDIAC CATHETERIZATION  10/06/2011   CATARACT EXTRACTION W/PHACO Right 07/31/2017   Procedure: CATARACT EXTRACTION PHACO AND INTRAOCULAR LENS PLACEMENT (IOC);  Surgeon: Fabio Pierce, MD;  Location: [facility];  Service: Ophthalmology;  Laterality: Right;  CDE: 2.33   CATARACT EXTRACTION W/PHACO Left 08/14/2017   Procedure: CATARACT EXTRACTION PHACO AND INTRAOCULAR LENS PLACEMENT (IOC);  Surgeon: Fabio Pierce, MD;  Location: [facility];  Service: Ophthalmology;  Laterality: Left;  CDE: 2.74   CHOLECYSTECTOMY     CYSTOSCOPY     stone   DIAGNOSTIC LAPAROSCOPY     laparoscopic cholecystectomy   DOPPLER ECHOCARDIOGRAPHY  2009   ESOPHAGOGASTRODUODENOSCOPY N/A 10/31/2020   Procedure: ESOPHAGOGASTRODUODENOSCOPY (EGD);  Surgeon: Gaynelle Adu, MD;  Location: [facility];  Service: General;  Laterality: N/A;   EYE SURGERY  Bilateral 08/14/2017   cataract removal   GLUTEUS MINIMUS REPAIR Left 11/04/2022   Procedure: LEFT GLUTEUS MEDIUS REPAIR;  Surgeon: Huel Cote, MD;  Location: [facility] OR;  Service: Orthopedics;  Laterality: Left;   head up tilt table testing  06/15/2007   [name]   HEMORRHOID SURGERY     HERNIA REPAIR     insertion of implatable loop recorder  08/11/2007   [name]   POSTERIOR CERVICAL FUSION/FORAMINOTOMY N/A 12/19/2013   Procedure: RIGHT C3-4.C4-5 AND C5-6 FORAMINOTOMIES;  Surgeon: Kerrin Champagne, MD;  Location: [facility] OR;  Service: Orthopedics;  Laterality: N/A;   TOTAL HIP ARTHROPLASTY Left 01/31/2020   Procedure: LEFT TOTAL HIP ARTHROPLASTY ANTERIOR APPROACH;  Surgeon: Kathryne Hitch, MD;  Location: [facility] OR;  Service: Orthopedics;  Laterality: Left;   TOTAL SHOULDER ARTHROPLASTY Right 11/15/2019   Procedure: RIGHT TOTAL SHOULDER ARTHROPLASTY;  Surgeon: Cammy Copa,  MD;  Location: [facility];  Service: Orthopedics;  Laterality: Right;   TOTAL SHOULDER REVISION Right 06/11/2021   Procedure: RIGHT SHOULDER CONVERSION TOTAL SHOULDER ARTHROPLASTY to REVERSE TOTAL SHOULDER ARTHROPLASTY;  Surgeon: Cammy Copa, MD;  Location: [facility];  Service: Orthopedics;  Laterality: Right;   TUBAL LIGATION     UPPER GASTROINTESTINAL ENDOSCOPY     XI ROBOTIC ASSISTED HIATAL HERNIA REPAIR N/A 10/31/2020   Procedure: XI ROBOTIC ASSISTED HIATAL HERNIA REPAIR WITH PARTIAL FUNDOPLICATION;  Surgeon: Gaynelle Adu, MD;  Location: [facility];  Service: General;  Laterality: N/A;   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   Hypokalemia 11/25/2020   S/P laparoscopic fundoplication 10/31/2020   Status post total replacement of left hip 01/31/2020   Unilateral primary osteoarthritis, left hip 12/21/2019   S/P shoulder replacement, right 11/15/2019   Overweight (BMI 25.0-29.9) 04/15/2019   Diverticulosis 04/14/2019   Chronic diastolic CHF (congestive heart failure) (HCC) 04/14/2019   Unstable angina (HCC) 10/20/2017   Chronic pain 09/23/2016   Pre-diabetes 09/02/2016   Recurrent major depressive disorder, in partial remission (HCC) 07/14/2016   Seizures (HCC) 07/14/2016   GAD (generalized anxiety disorder) 07/14/2016   Insomnia 09/05/2014   Allergic rhinitis 09/05/2014   Cervical spondylosis without myelopathy 12/19/2013    Class: Chronic   Neural foraminal stenosis of cervical spine 12/19/2013   Cervical radiculitis 09/19/2013   Lumbosacral spondylosis without myelopathy 11/16/2012   Postlaminectomy syndrome, lumbar region 11/16/2012   Herpes simplex virus (HSV) infection 10/31/2008   Hyperlipidemia with target  LDL less than 100 10/31/2008   Essential hypertension 10/31/2008   GERD 10/31/2008   SPINAL STENOSIS OF LUMBAR REGION 10/31/2008   Myalgia 10/31/2008   Osteoporosis 10/31/2008   SPONDYLOLISTHESIS 10/31/2008   SYNCOPE 10/31/2008   Sleep apnea 10/31/2008    PCP: Bennie Pierini FNP   REFERRING PROVIDER: Dr Huel Cote   REFERRING DIAG:   THERAPY DIAG:  Pain in left hip  Stiffness of left hip, not elsewhere classified  Other abnormalities of gait and mobility  Rationale for Evaluation and Treatment: Rehabilitation  ONSET DATE: 11/04/222  SUBJECTIVE:   SUBJECTIVE STATEMENT: Pt reports not afraid of water  PERTINENT HISTORY: Migraines, osteopetrosis, anemia, history of angina, anxiety, rheumatoid arthritis, asthma, bipolar disorder, bilateral cataracts, congestive heart failure, COPD, depression, diabetes, fibromyalgia, history of migraines,  history of kidney stones, history of myocardial fraction, osteoporosis, history of seizures although none in the past 3 to 4 years, spinal stenosis with fusion, supraventricular tachycardia, syncope and collapse, cervical fusion, left total hip arthroplasty, right total shoulder arthroplasty with a revision, PAIN:  Are you having pain? Yes: NPRS scale: 4/10 right now 5/10 at worst  Pain location: anterior and lateral left hip  Pain description: aching  Aggravating factors: standing and walking  Relieving factors: pain medication   PRECAUTIONS: None  RED FLAGS: None   WEIGHT BEARING RESTRICTIONS: No  FALLS:  Has patient fallen in last 6 months? 2 falls; blacks out sometimes; fell a day after the surgery but on the other hip   LIVING ENVIRONMENT: 2 steps into the house and 15 to the upstairs OCCUPATION:  On disability   Hobbies: Church and playing guitar  PLOF: Independent  PATIENT GOALS:  Less pain   NEXT MD VISIT:  September 18th   OBJECTIVE:   DIAGNOSTIC FINDINGS:  Nothing post op   PATIENT SURVEYS:   FOTO    COGNITION: Overall cognitive status: Within functional limits for tasks assessed     SENSATION: Denies paresthesias   EDEMA:   MUSCLE LENGTH:   POSTURE: No Significant postural limitations  PALPATION: No expected unexpected tenderness palpation  LOWER EXTREMITY ROM:  MMT Right eval Left eval  Hip flexion    Hip extension    Hip abduction    Hip adduction    Hip internal rotation    Hip external rotation    Knee flexion    Knee extension    Ankle dorsiflexion    Ankle plantarflexion    Ankle inversion    Ankle eversion     (Blank rows = not tested) not performed secondary to recent surgery  LOWER EXTREMITY MMT:  Passive range of motion Right eval Left eval  Hip flexion  86 degrees  Hip extension    Hip abduction    Hip adduction    Hip internal rotation  3 degrees  Hip external rotation  Not performed per protocol  Knee flexion    Knee extension    Ankle dorsiflexion    Ankle plantarflexion    Ankle inversion    Ankle eversion     (Blank rows = not tested)    GAIT: Ambulating without assistive device.  Mild decrease in single-leg stance on right leg.  Mild trunk flexion with ambulation. TODAY'S TREATMENT:                                                                                                                              DATE:   12/23/22 Pt seen for aquatic therapy today.  Treatment took place in water 3.5-4.75 ft in depth at the [facility] pool. Temp of water was 91.  Pt entered/exited the pool via staris using step to pattern with hand rail.  *Intro to setting *stair climbing review. She completes indep  Pt  completing properly *walking unsupported Seated on lift: gentle cycling; hip add/abd, flutter kick; Laq small range slow to tolerance *Standing 3.6 ft ue support on wall: DF; PF x10; Left hip add to midline x 5 Right add/abd. *TrA set: 1/2 noodle pull down wide stand then staggered x 10 *decompression: pt has  difficulty holding still rolls; able to stay vertical without noodle *bow&arrow 3.6 ft. Cues for weight shift R to left. X 8   Pt requires the buoyancy and hydrostatic pressure of water for support, and to offload joints by unweighting joint load by at least 50 % in navel deep water and by at least 75-80% in chest to neck deep water.  Viscosity of the water is needed for resistance of strengthening. Water current perturbations provides challenge to standing balance requiring increased core activation.    PROM all planes STM anterior hip and lateral quad  Bridge 2x10 Hooklying ball squeeze 5" 2x10 Prone IR/ER x10ea Prone hip ext (attempted, but unable) Prone glute set 5" 2x10 HEP    Eval:Attempted to perform exercises per protocol.  Patient increased pain with quad set.  Exercise halted.  Attempted to perform posterior pelvic tilt.  Patient reports he is unable to perform secondary to spinal stenosis.  The patient is doing well patient advised just to walk at this time.  She reports she has tried stairs patient strongly advised not to do stairs at this time.  She was advised to go up with her nonsurgical leg down with her surgical leg.   PATIENT EDUCATION:  Education details: HEP symptom management, progression of activity  Person educated: Patient Education method: Explanation, Demonstration, Tactile cues, Verbal cues, and Handouts Education comprehension: verbalized understanding, returned demonstration, verbal cues required, tactile cues required, and needs further education  HOME EXERCISE PROGRAM: Access Code: [email or web address] URL: [email or web address] Date: 12/19/2022 Prepared by: Riki Altes  Exercises - Heel Toe Raises with Counter Support  - 2 x daily - 7 x weekly - 3 sets - 10 reps - Supine Bridge  - 2 x daily - 7 x weekly - 2 sets - 10 reps - Prone Hip Internal Rotation AROM  - 1 x daily - 7 x weekly - 2 sets - 10 reps - Prone Hip External Rotation AROM  - 1 x  daily - 7 x weekly - 2 sets - 10 reps - Prone Gluteal Sets  - 2 x daily - 7 x weekly - 2 sets - 10 reps - 5seconds hold  ASSESSMENT:  CLINICAL IMPRESSION: Pt demonstrates safety and indep in setting. She amb 500 ft from parking deck without increase in pain. She demonstrates proper stair climbing as instructed last session land based. She is guarded with movement throughout session improving ROM slightly as session progresses.  She has some difficulty controlling her ability to float (very buoyant).  Pain reports remain consistent throughout session.  She does require multiple recovery periods in squatted position submerged.  Good toleration to initial setting.  Will better assess upon pts return.  Goals ongoing.    Eval: Patient is a 67 year old female status post left glute med repair 11/03/2020.  She is doing well at this time.  She is walking with a mild antalgic gait without an assistive device.  Her range of motion is within protocol limits without significant pain.  She has a significant past medical history including left hip replacement, RA, and lumbar spine surgery.  She was advised at this time to continue walking at home.  Her pain is  minimal at this time.  Exercises cause an exacerbation in pain even though there base exercises.  We will assess next week and add exercises according to protocol.  OBJECTIVE IMPAIRMENTS: Abnormal gait, decreased activity tolerance, decreased knowledge of condition, difficulty walking, decreased ROM, decreased strength, and pain.   ACTIVITY LIMITATIONS: carrying, lifting, bending, sitting, standing, squatting, stairs, bed mobility, and locomotion level  PARTICIPATION LIMITATIONS: meal prep, cleaning, laundry, shopping, community activity, yard work, and church  PERSONAL FACTORS: 3+ comorbidities: , Left hip replacement, lumbar spine surgery/fusion, right total shoulder replacement, history of unstable angina, some type of syncopal-like episodes that caused  her to fall.  are also affecting patient's functional outcome.   REHAB POTENTIAL: Good  CLINICAL DECISION MAKING: Evolving/moderate complexity significant past medical history limiting her ability to perform exercises  EVALUATION COMPLEXITY: Moderate   GOALS: Goals reviewed with patient? Yes  SHORT TERM GOALS: Target date: 12/18/2022   Patient will demonstrate full range of motion within protocol limits at 4 weeks Baseline: Goal status: INITIAL  2.  Patient will ambulate 500 feet without increased pain Baseline:  Goal status: Met 12/23/22  3.  Patient will transfer sit to stand without increased pain Baseline:  Goal status: INITIAL  4.  Patient will be independent with basic exercise protocol Baseline:  Goal status: INITIAL   LONG TERM GOALS: Target date: 01/15/2023    Patient will go up and down stairs with reciprocal pattern without increased pain Baseline:  Goal status: INITIAL  2.  Patient will ambulate community distances as tolerated without increased pain Baseline:  Goal status: INITIAL  3.  Patient will sit for greater than 30 minutes without increased pain Baseline:  Goal status: INITIAL   PLAN:  PT FREQUENCY: 1-2x/week  PT DURATION: 8 weeks  PLANNED INTERVENTIONS: Therapeutic exercises, Therapeutic activity, Neuromuscular re-education, Balance training, Gait training, Patient/Family education, Self Care, Joint mobilization, Stair training, DME instructions, Aquatic Therapy , Dry Needling, Electrical stimulation, Cryotherapy, Moist heat, Taping, Manual therapy, and Re-evaluation.   PLAN FOR NEXT SESSION:  Consider hip isometrics per protocol, continue with range of motion per protocol, continue to encourage patient to walk.   Geni Bers, PT 12/23/2022, 1:00 PM

## 2022-12-25 ENCOUNTER — Other Ambulatory Visit (HOSPITAL_BASED_OUTPATIENT_CLINIC_OR_DEPARTMENT_OTHER): Payer: Self-pay

## 2022-12-25 ENCOUNTER — Other Ambulatory Visit: Payer: Self-pay | Admitting: Nurse Practitioner

## 2022-12-25 ENCOUNTER — Other Ambulatory Visit: Payer: Self-pay | Admitting: Sports Medicine

## 2022-12-25 ENCOUNTER — Other Ambulatory Visit: Payer: Self-pay | Admitting: Family Medicine

## 2022-12-25 DIAGNOSIS — B009 Herpesviral infection, unspecified: Secondary | ICD-10-CM

## 2022-12-29 ENCOUNTER — Ambulatory Visit (HOSPITAL_BASED_OUTPATIENT_CLINIC_OR_DEPARTMENT_OTHER): Payer: 59

## 2022-12-29 ENCOUNTER — Encounter (HOSPITAL_BASED_OUTPATIENT_CLINIC_OR_DEPARTMENT_OTHER): Payer: Self-pay

## 2022-12-29 DIAGNOSIS — M25552 Pain in left hip: Secondary | ICD-10-CM

## 2022-12-29 DIAGNOSIS — R2689 Other abnormalities of gait and mobility: Secondary | ICD-10-CM

## 2022-12-29 DIAGNOSIS — M25652 Stiffness of left hip, not elsewhere classified: Secondary | ICD-10-CM

## 2022-12-29 NOTE — Therapy (Signed)
OUTPATIENT PHYSICAL THERAPY LOWER EXTREMITY TREATMENT   Patient Name: [name] MRN: [id number] DOB:[birth year], 67 y.o., female Today's Date: 12/29/2022  END OF SESSION:  PT End of Session - 12/29/22 0857     Visit Number 5    Number of Visits 16    Date for PT Re-Evaluation 01/15/23    PT Start Time 0852    PT Stop Time 0930    PT Time Calculation (min) 38 min    Activity Tolerance Patient tolerated treatment well    Behavior During Therapy [facility] for tasks assessed/performed                 Past Medical History:  Diagnosis Date   Allergy    Anemia    Anginal pain (HCC)    last time    Anxiety    Arthritis    RHEUMATOID   Asthma    Bipolar 1 disorder (HCC)    Blood transfusion without reported diagnosis    x 3   Cataracts, bilateral 07/2017   CHF (congestive heart failure) (HCC)    COPD (chronic obstructive pulmonary disease) (HCC)    Coronary artery disease    reported hx of "MI";  Echo 2009 with normal LVF;  Myoview 05/2011: no ischemia   Depression    Diabetes mellitus without complication (HCC)    Dyslipidemia    Dysrhythmia    SVT   Elevated liver enzymes 06/30/2022   Esophageal stricture    Fibromyalgia    GERD (gastroesophageal reflux disease)    H/O hiatal hernia    Head injury, unspecified    Headache    migraines   Herpes simplex infection    History of kidney stones    History of loop recorder 08/10/2017   Managed by Dr. Sharrell Ku   Hyperlipidemia    Hypertension    Insomnia    Myocardial infarction [facility])    age 71   Osteoporosis    Pneumonia    hx   Seizures (HCC)    none in the last 3-4 years as of 11/03/22 per patient   Shortness of breath    Sleep apnea    mild, does not require positive pressure treatment. (02/03/22)   Spinal stenosis of lumbar region    Spondylolisthesis    Status post placement of implantable loop recorder    Supraventricular tachycardia    Syncope and collapse    s/p ILR; no arhythmogenic cause  identified   UTI (lower urinary tract infection)    Past Surgical History:  Procedure Laterality Date   BACK SURGERY     BREAST EXCISIONAL BIOPSY Right    1990s   BREAST SURGERY     lumpectomy   CARDIAC CATHETERIZATION  10/06/2011   CATARACT EXTRACTION W/PHACO Right 07/31/2017   Procedure: CATARACT EXTRACTION PHACO AND INTRAOCULAR LENS PLACEMENT (IOC);  Surgeon: Fabio Pierce, MD;  Location: [facility];  Service: Ophthalmology;  Laterality: Right;  CDE: 2.33   CATARACT EXTRACTION W/PHACO Left 08/14/2017   Procedure: CATARACT EXTRACTION PHACO AND INTRAOCULAR LENS PLACEMENT (IOC);  Surgeon: Fabio Pierce, MD;  Location: [facility];  Service: Ophthalmology;  Laterality: Left;  CDE: 2.74   CHOLECYSTECTOMY     CYSTOSCOPY     stone   DIAGNOSTIC LAPAROSCOPY     laparoscopic cholecystectomy   DOPPLER ECHOCARDIOGRAPHY  2009   ESOPHAGOGASTRODUODENOSCOPY N/A 10/31/2020   Procedure: ESOPHAGOGASTRODUODENOSCOPY (EGD);  Surgeon: Gaynelle Adu, MD;  Location: [facility];  Service: General;  Laterality: N/A;   EYE  SURGERY Bilateral 08/14/2017   cataract removal   GLUTEUS MINIMUS REPAIR Left 11/04/2022   Procedure: LEFT GLUTEUS MEDIUS REPAIR;  Surgeon: Huel Cote, MD;  Location: [facility] OR;  Service: Orthopedics;  Laterality: Left;   head up tilt table testing  06/15/2007   [name]   HEMORRHOID SURGERY     HERNIA REPAIR     insertion of implatable loop recorder  08/11/2007   [name]   POSTERIOR CERVICAL FUSION/FORAMINOTOMY N/A 12/19/2013   Procedure: RIGHT C3-4.C4-5 AND C5-6 FORAMINOTOMIES;  Surgeon: Kerrin Champagne, MD;  Location: [facility] OR;  Service: Orthopedics;  Laterality: N/A;   TOTAL HIP ARTHROPLASTY Left 01/31/2020   Procedure: LEFT TOTAL HIP ARTHROPLASTY ANTERIOR APPROACH;  Surgeon: Kathryne Hitch, MD;  Location: [facility] OR;  Service: Orthopedics;  Laterality: Left;   TOTAL SHOULDER ARTHROPLASTY Right 11/15/2019   Procedure: RIGHT TOTAL SHOULDER ARTHROPLASTY;  Surgeon: Cammy Copa,  MD;  Location: [facility];  Service: Orthopedics;  Laterality: Right;   TOTAL SHOULDER REVISION Right 06/11/2021   Procedure: RIGHT SHOULDER CONVERSION TOTAL SHOULDER ARTHROPLASTY to REVERSE TOTAL SHOULDER ARTHROPLASTY;  Surgeon: Cammy Copa, MD;  Location: [facility] OR;  Service: Orthopedics;  Laterality: Right;   TUBAL LIGATION     UPPER GASTROINTESTINAL ENDOSCOPY     XI ROBOTIC ASSISTED HIATAL HERNIA REPAIR N/A 10/31/2020   Procedure: XI ROBOTIC ASSISTED HIATAL HERNIA REPAIR WITH PARTIAL FUNDOPLICATION;  Surgeon: Gaynelle Adu, MD;  Location: [facility];  Service: General;  Laterality: N/A;   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   Hypokalemia 11/25/2020   S/P laparoscopic fundoplication 10/31/2020   Status post total replacement of left hip 01/31/2020   Unilateral primary osteoarthritis, left hip 12/21/2019   S/P shoulder replacement, right 11/15/2019   Overweight (BMI 25.0-29.9) 04/15/2019   Diverticulosis 04/14/2019   Chronic diastolic CHF (congestive heart failure) (HCC) 04/14/2019   Unstable angina (HCC) 10/20/2017   Chronic pain 09/23/2016   Pre-diabetes 09/02/2016   Recurrent major depressive disorder, in partial remission (HCC) 07/14/2016   Seizures (HCC) 07/14/2016   GAD (generalized anxiety disorder) 07/14/2016   Insomnia 09/05/2014   Allergic rhinitis 09/05/2014   Cervical spondylosis without myelopathy 12/19/2013    Class: Chronic   Neural foraminal stenosis of cervical spine 12/19/2013   Cervical radiculitis 09/19/2013   Lumbosacral spondylosis without myelopathy 11/16/2012   Postlaminectomy syndrome, lumbar region 11/16/2012   Herpes simplex virus (HSV) infection 10/31/2008   Hyperlipidemia with target  LDL less than 100 10/31/2008   Essential hypertension 10/31/2008   GERD 10/31/2008   SPINAL STENOSIS OF LUMBAR REGION 10/31/2008   Myalgia 10/31/2008   Osteoporosis 10/31/2008   SPONDYLOLISTHESIS 10/31/2008   SYNCOPE 10/31/2008   Sleep apnea 10/31/2008    PCP: Bennie Pierini FNP   REFERRING PROVIDER: Dr Huel Cote   REFERRING DIAG:   THERAPY DIAG:  Pain in left hip  Other abnormalities of gait and mobility  Stiffness of left hip, not elsewhere classified  Rationale for Evaluation and Treatment: Rehabilitation  ONSET DATE: 11/04/222  SUBJECTIVE:   SUBJECTIVE STATEMENT: Pt reports she fell 2 days ago in her closet. Hit her head against the wall and landed in a plastic storage bin on her L hip. Bruised a toe. Needed assistance  to return to standing. No immediate hip pain, but has has increased lateral hip pain since. Rates pain at 6/10 this morning. Some soreness after pool therapy.   PERTINENT HISTORY: Migraines, osteopetrosis, anemia, history of angina, anxiety, rheumatoid arthritis, asthma, bipolar disorder, bilateral cataracts, congestive heart failure, COPD, depression, diabetes, fibromyalgia, history of migraines, history of kidney stones, history of myocardial fraction, osteoporosis, history of seizures although none in the past 3 to 4 years, spinal stenosis with fusion, supraventricular tachycardia, syncope and collapse, cervical fusion, left total hip arthroplasty, right total shoulder arthroplasty with a revision, PAIN:  Are you having pain? Yes: NPRS scale: 4/10 right now 5/10 at worst  Pain location: anterior and lateral left hip  Pain description: aching  Aggravating factors: standing and walking  Relieving factors: pain medication   PRECAUTIONS: None  RED FLAGS: None   WEIGHT BEARING RESTRICTIONS: No  FALLS:  Has patient fallen in last 6 months? 2 falls; blacks out sometimes; fell a day after the surgery but on the other hip   LIVING  ENVIRONMENT: 2 steps into the house and 15 to the upstairs OCCUPATION:  On disability   Hobbies: Church and playing guitar  PLOF: Independent  PATIENT GOALS:  Less pain   NEXT MD VISIT:  September 18th   OBJECTIVE:   DIAGNOSTIC FINDINGS:  Nothing post op   PATIENT SURVEYS:  FOTO    COGNITION: Overall cognitive status: Within functional limits for tasks assessed     SENSATION: Denies paresthesias   EDEMA:   MUSCLE LENGTH:   POSTURE: No Significant postural limitations  PALPATION: No expected unexpected tenderness palpation  LOWER EXTREMITY ROM:  MMT Right eval Left eval  Hip flexion    Hip extension    Hip abduction    Hip adduction    Hip internal rotation    Hip external rotation    Knee flexion    Knee extension    Ankle dorsiflexion    Ankle plantarflexion    Ankle inversion    Ankle eversion     (Blank rows = not tested) not performed secondary to recent surgery  LOWER EXTREMITY MMT:  Passive range of motion Right eval Left eval  Hip flexion  86 degrees  Hip extension    Hip abduction    Hip adduction    Hip internal rotation  3 degrees  Hip external rotation  Not performed per protocol  Knee flexion    Knee extension    Ankle dorsiflexion    Ankle plantarflexion    Ankle inversion    Ankle eversion     (Blank rows = not tested)    GAIT: Ambulating without assistive device.  Mild decrease in single-leg stance on right leg.  Mild trunk flexion with ambulation. TODAY'S TREATMENT:                                                                                                                              DATE:  9/30 PROM all planes L hip STM quad and lateral hip  Bridge 2x10 Hooklying ball squeeze 5" 2x10 Prone glute set 5" 2x10 Seated LAQ 5" 2x10   12/23/22 Pt seen for aquatic therapy today.  Treatment took place in water 3.5-4.75 ft in depth at the [facility] pool. Temp of water was 91.  Pt entered/exited the  pool via staris using step to pattern with hand rail.  *Intro to setting *stair climbing review. She completes indep  Pt completing properly *walking unsupported Seated on lift: gentle cycling; hip add/abd, flutter kick; Laq small range slow to tolerance *Standing 3.6 ft ue support on wall: DF; PF x10; Left hip add to midline x 5 Right add/abd. *TrA set: 1/2 noodle pull down wide stand then staggered x 10 *decompression: pt has difficulty holding still rolls; able to stay vertical without noodle *bow&arrow 3.6 ft. Cues for weight shift R to left. X 8   Pt requires the buoyancy and hydrostatic pressure of water for support, and to offload joints by unweighting joint load by at least 50 % in navel deep water and by at least 75-80% in chest to neck deep water.  Viscosity of the water is needed for resistance of strengthening. Water current perturbations provides challenge to standing balance requiring increased core activation.    PATIENT EDUCATION:  Education details: HEP symptom management, progression of activity  Person educated: Patient Education method: Explanation, Demonstration, Tactile cues, Verbal cues, and Handouts Education comprehension: verbalized understanding, returned demonstration, verbal cues required, tactile cues required, and needs further education  HOME EXERCISE PROGRAM: Access Code: [email or web address] URL: [email or web address] Date: 12/19/2022 Prepared by: Riki Altes  Exercises - Heel Toe Raises with Counter Support  - 2 x daily - 7 x weekly - 3 sets - 10 reps - Supine Bridge  - 2 x daily - 7 x weekly - 2 sets - 10 reps - Prone Hip Internal Rotation AROM  - 1 x daily - 7 x weekly - 2 sets - 10 reps - Prone Hip External Rotation AROM  - 1 x daily - 7 x weekly - 2 sets - 10 reps - Prone Gluteal Sets  - 2 x daily - 7 x weekly - 2 sets - 10 reps - 5seconds hold  ASSESSMENT:  CLINICAL IMPRESSION: Pt presents with increased tightness and discomfort in L  hip with PROM. Decreased intensity of exercises today due to pain level following recent fall. No signs of injury to repair, though she is very tender and tight in lateral hip and quad. Instructed pt to use ice at home to manage inflammation and pain. Also advised her to decrease her activity level until pain level improves.     Eval: Patient is a 67 year old female status post left glute med repair 11/03/2020.  She is doing well at this time.  She is walking with a mild antalgic gait without an assistive device.  Her range of motion is within protocol limits without significant pain.  She has a significant past medical history including left hip replacement, RA, and lumbar spine surgery.  She was advised at this time to continue walking at home.  Her pain is minimal at this time.  Exercises cause an exacerbation in pain even though there base exercises.  We will assess next week and add exercises according to protocol.  OBJECTIVE IMPAIRMENTS: Abnormal gait, decreased activity tolerance, decreased knowledge of condition, difficulty walking, decreased ROM, decreased strength, and pain.   ACTIVITY LIMITATIONS: carrying, lifting, bending, sitting, standing,  squatting, stairs, bed mobility, and locomotion level  PARTICIPATION LIMITATIONS: meal prep, cleaning, laundry, shopping, community activity, yard work, and church  PERSONAL FACTORS: 3+ comorbidities: , Left hip replacement, lumbar spine surgery/fusion, right total shoulder replacement, history of unstable angina, some type of syncopal-like episodes that caused her to fall.  are also affecting patient's functional outcome.   REHAB POTENTIAL: Good  CLINICAL DECISION MAKING: Evolving/moderate complexity significant past medical history limiting her ability to perform exercises  EVALUATION COMPLEXITY: Moderate   GOALS: Goals reviewed with patient? Yes  SHORT TERM GOALS: Target date: 12/18/2022   Patient will demonstrate full range of motion  within protocol limits at 4 weeks Baseline: Goal status: INITIAL  2.  Patient will ambulate 500 feet without increased pain Baseline:  Goal status: Met 12/23/22  3.  Patient will transfer sit to stand without increased pain Baseline:  Goal status: INITIAL  4.  Patient will be independent with basic exercise protocol Baseline:  Goal status: INITIAL   LONG TERM GOALS: Target date: 01/15/2023    Patient will go up and down stairs with reciprocal pattern without increased pain Baseline:  Goal status: INITIAL  2.  Patient will ambulate community distances as tolerated without increased pain Baseline:  Goal status: INITIAL  3.  Patient will sit for greater than 30 minutes without increased pain Baseline:  Goal status: INITIAL   PLAN:  PT FREQUENCY: 1-2x/week  PT DURATION: 8 weeks  PLANNED INTERVENTIONS: Therapeutic exercises, Therapeutic activity, Neuromuscular re-education, Balance training, Gait training, Patient/Family education, Self Care, Joint mobilization, Stair training, DME instructions, Aquatic Therapy , Dry Needling, Electrical stimulation, Cryotherapy, Moist heat, Taping, Manual therapy, and Re-evaluation.   PLAN FOR NEXT SESSION:  Consider hip isometrics per protocol, continue with range of motion per protocol, continue to encourage patient to walk.   Donnel Saxon Tauriel Scronce, PTA 12/29/2022, 9:41 AM

## 2022-12-31 ENCOUNTER — Ambulatory Visit (HOSPITAL_BASED_OUTPATIENT_CLINIC_OR_DEPARTMENT_OTHER): Payer: 59 | Admitting: Physical Therapy

## 2023-01-01 ENCOUNTER — Ambulatory Visit (HOSPITAL_BASED_OUTPATIENT_CLINIC_OR_DEPARTMENT_OTHER): Payer: 59 | Admitting: Physical Therapy

## 2023-01-06 ENCOUNTER — Ambulatory Visit (HOSPITAL_BASED_OUTPATIENT_CLINIC_OR_DEPARTMENT_OTHER): Payer: 59 | Attending: Orthopaedic Surgery | Admitting: Physical Therapy

## 2023-01-06 ENCOUNTER — Encounter (HOSPITAL_BASED_OUTPATIENT_CLINIC_OR_DEPARTMENT_OTHER): Payer: Self-pay | Admitting: Physical Therapy

## 2023-01-06 DIAGNOSIS — R2689 Other abnormalities of gait and mobility: Secondary | ICD-10-CM | POA: Diagnosis present

## 2023-01-06 DIAGNOSIS — M25652 Stiffness of left hip, not elsewhere classified: Secondary | ICD-10-CM | POA: Insufficient documentation

## 2023-01-06 DIAGNOSIS — M25552 Pain in left hip: Secondary | ICD-10-CM | POA: Diagnosis present

## 2023-01-06 NOTE — Therapy (Signed)
OUTPATIENT PHYSICAL THERAPY LOWER EXTREMITY TREATMENT   Patient Name: [name] MRN: [id number] DOB:24-Dec-1955, 67 y.o., female Today's Date: 01/06/2023  END OF SESSION:  PT End of Session - 01/06/23 1032     Visit Number 6    Number of Visits 16    Date for PT Re-Evaluation 01/15/23    PT Start Time 1032    PT Stop Time 1111    PT Time Calculation (min) 39 min    Behavior During Therapy WFL for tasks assessed/performed                 Past Medical History:  Diagnosis Date   Allergy    Anemia    Anginal pain (HCC)    last time    Anxiety    Arthritis    RHEUMATOID   Asthma    Bipolar 1 disorder (HCC)    Blood transfusion without reported diagnosis    x 3   Cataracts, bilateral 07/2017   CHF (congestive heart failure) (HCC)    COPD (chronic obstructive pulmonary disease) (HCC)    Coronary artery disease    reported hx of "MI";  Echo 2009 with normal LVF;  Myoview 05/2011: no ischemia   Depression    Diabetes mellitus without complication (HCC)    Dyslipidemia    Dysrhythmia    SVT   Elevated liver enzymes 06/30/2022   Esophageal stricture    Fibromyalgia    GERD (gastroesophageal reflux disease)    H/O hiatal hernia    Head injury, unspecified    Headache    migraines   Herpes simplex infection    History of kidney stones    History of loop recorder 08/10/2017   Managed by Dr. Sharrell Ku   Hyperlipidemia    Hypertension    Insomnia    Myocardial infarction [facility])    age 63   Osteoporosis    Pneumonia    hx   Seizures (HCC)    none in the last 3-4 years as of 11/03/22 per patient   Shortness of breath    Sleep apnea    mild, does not require positive pressure treatment. (02/03/22)   Spinal stenosis of lumbar region    Spondylolisthesis    Status post placement of implantable loop recorder    Supraventricular tachycardia (HCC)    Syncope and collapse    s/p ILR; no arhythmogenic cause identified   UTI (lower urinary tract infection)     Past Surgical History:  Procedure Laterality Date   BACK SURGERY     BREAST EXCISIONAL BIOPSY Right    1990s   BREAST SURGERY     lumpectomy   CARDIAC CATHETERIZATION  10/06/2011   CATARACT EXTRACTION W/PHACO Right 07/31/2017   Procedure: CATARACT EXTRACTION PHACO AND INTRAOCULAR LENS PLACEMENT (IOC);  Surgeon: Fabio Pierce, MD;  Location: [facility];  Service: Ophthalmology;  Laterality: Right;  CDE: 2.33   CATARACT EXTRACTION W/PHACO Left 08/14/2017   Procedure: CATARACT EXTRACTION PHACO AND INTRAOCULAR LENS PLACEMENT (IOC);  Surgeon: Fabio Pierce, MD;  Location: [facility];  Service: Ophthalmology;  Laterality: Left;  CDE: 2.74   CHOLECYSTECTOMY     CYSTOSCOPY     stone   DIAGNOSTIC LAPAROSCOPY     laparoscopic cholecystectomy   DOPPLER ECHOCARDIOGRAPHY  2009   ESOPHAGOGASTRODUODENOSCOPY N/A 10/31/2020   Procedure: ESOPHAGOGASTRODUODENOSCOPY (EGD);  Surgeon: Gaynelle Adu, MD;  Location: [facility];  Service: General;  Laterality: N/A;   EYE SURGERY Bilateral 08/14/2017   cataract removal  GLUTEUS MINIMUS REPAIR Left 11/04/2022   Procedure: LEFT GLUTEUS MEDIUS REPAIR;  Surgeon: Huel Cote, MD;  Location: [facility];  Service: Orthopedics;  Laterality: Left;   head up tilt table testing  06/15/2007   [name]   HEMORRHOID SURGERY     HERNIA REPAIR     insertion of implatable loop recorder  08/11/2007   [name]   POSTERIOR CERVICAL FUSION/FORAMINOTOMY N/A 12/19/2013   Procedure: RIGHT C3-4.C4-5 AND C5-6 FORAMINOTOMIES;  Surgeon: Kerrin Champagne, MD;  Location: [facility] OR;  Service: Orthopedics;  Laterality: N/A;   TOTAL HIP ARTHROPLASTY Left 01/31/2020   Procedure: LEFT TOTAL HIP ARTHROPLASTY ANTERIOR APPROACH;  Surgeon: Kathryne Hitch, MD;  Location: [facility] OR;  Service: Orthopedics;  Laterality: Left;   TOTAL SHOULDER ARTHROPLASTY Right 11/15/2019   Procedure: RIGHT TOTAL SHOULDER ARTHROPLASTY;  Surgeon: Cammy Copa, MD;  Location: [facility];  Service: Orthopedics;   Laterality: Right;   TOTAL SHOULDER REVISION Right 06/11/2021   Procedure: RIGHT SHOULDER CONVERSION TOTAL SHOULDER ARTHROPLASTY to REVERSE TOTAL SHOULDER ARTHROPLASTY;  Surgeon: Cammy Copa, MD;  Location: [facility] OR;  Service: Orthopedics;  Laterality: Right;   TUBAL LIGATION     UPPER GASTROINTESTINAL ENDOSCOPY     XI ROBOTIC ASSISTED HIATAL HERNIA REPAIR N/A 10/31/2020   Procedure: XI ROBOTIC ASSISTED HIATAL HERNIA REPAIR WITH PARTIAL FUNDOPLICATION;  Surgeon: Gaynelle Adu, MD;  Location: [facility];  Service: General;  Laterality: N/A;   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   Hypokalemia 11/25/2020   S/P laparoscopic fundoplication 10/31/2020   Status post total replacement of left hip 01/31/2020   Unilateral primary osteoarthritis, left hip 12/21/2019   S/P shoulder replacement, right 11/15/2019   Overweight (BMI 25.0-29.9) 04/15/2019   Diverticulosis 04/14/2019   Chronic diastolic CHF (congestive heart failure) (HCC) 04/14/2019   Unstable angina (HCC) 10/20/2017   Chronic pain 09/23/2016   Pre-diabetes 09/02/2016   Recurrent major depressive disorder, in partial remission (HCC) 07/14/2016   Seizures (HCC) 07/14/2016   GAD (generalized anxiety disorder) 07/14/2016   Insomnia 09/05/2014   Allergic rhinitis 09/05/2014   Cervical spondylosis without myelopathy 12/19/2013    Class: Chronic   Neural foraminal stenosis of cervical spine 12/19/2013   Cervical radiculitis 09/19/2013   Lumbosacral spondylosis without myelopathy 11/16/2012   Postlaminectomy syndrome, lumbar region 11/16/2012   Herpes simplex virus (HSV) infection 10/31/2008   Hyperlipidemia with target LDL less than 100 10/31/2008   Essential  hypertension 10/31/2008   GERD 10/31/2008   SPINAL STENOSIS OF LUMBAR REGION 10/31/2008   Myalgia 10/31/2008   Osteoporosis 10/31/2008   SPONDYLOLISTHESIS 10/31/2008   SYNCOPE 10/31/2008   Sleep apnea 10/31/2008    PCP: Bennie Pierini FNP   REFERRING PROVIDER: Dr Huel Cote   REFERRING DIAG:   THERAPY DIAG:  Pain in left hip  Other abnormalities of gait and mobility  Stiffness of left hip, not elsewhere classified  Rationale for Evaluation and Treatment: Rehabilitation  ONSET DATE: 11/04/222  SUBJECTIVE:   SUBJECTIVE STATEMENT: Pt reports she fell 7 days ago when reaching down to pick up case of water at low shelf at [place].  She states she fell on the Lt thigh and hip; didn't have it checked out. Iced and pain resolved.  PERTINENT HISTORY: Migraines, osteopetrosis, anemia, history of angina, anxiety, rheumatoid arthritis, asthma, bipolar disorder, bilateral cataracts, congestive heart failure, COPD, depression, diabetes, fibromyalgia, history of migraines, history of kidney stones, history of myocardial fraction, osteoporosis, history of seizures although none in the past 3 to 4 years, spinal stenosis with fusion, supraventricular tachycardia, syncope and collapse, cervical fusion, left total hip arthroplasty, right total shoulder arthroplasty with a revision, PAIN:  Are you having pain? no: NPRS scale: 0/10 Pain location: Pain description:  Aggravating factors: standing and walking  Relieving factors: pain medication   PRECAUTIONS: None  RED FLAGS: None   WEIGHT BEARING RESTRICTIONS: No  FALLS:  Has patient fallen in last 6 months? 2 falls; blacks out sometimes; fell a day after the surgery but on the other hip   LIVING ENVIRONMENT: 2 steps into the house and 15 to the upstairs OCCUPATION:  On disability   Hobbies: Church and playing guitar  PLOF: Independent  PATIENT GOALS:  Less pain   NEXT MD VISIT:  September 18th   OBJECTIVE:    DIAGNOSTIC FINDINGS:  Nothing post op   PATIENT SURVEYS:  FOTO    COGNITION: Overall cognitive status: Within functional limits for tasks assessed     SENSATION: Denies paresthesias   EDEMA:   MUSCLE LENGTH:   POSTURE: No Significant postural limitations  PALPATION: No expected unexpected tenderness palpation  LOWER EXTREMITY ROM:  MMT Right eval Left eval  Hip flexion    Hip extension    Hip abduction    Hip adduction    Hip internal rotation    Hip external rotation    Knee flexion    Knee extension    Ankle dorsiflexion    Ankle plantarflexion    Ankle inversion    Ankle eversion     (Blank rows = not tested) not performed secondary to recent surgery  LOWER EXTREMITY MMT:  Passive range of motion Right eval Left eval  Hip flexion  86 degrees  Hip extension    Hip abduction    Hip adduction    Hip internal rotation  3 degrees  Hip external rotation  Not performed per protocol  Knee flexion    Knee extension    Ankle dorsiflexion    Ankle plantarflexion    Ankle inversion    Ankle eversion     (Blank rows = not tested)    GAIT: Ambulating without assistive device.  Mild decrease in single-leg stance on right leg.  Mild trunk flexion with ambulation. TODAY'S TREATMENT:                                                                                                                              DATE:  01/06/23 Pt seen for aquatic therapy today.  Treatment took place in water 3.5-4.75 ft in depth at the [facility] pool. Temp of water was 91.  Pt entered/exited the pool via staris using step-to pattern with hand rail.  *  reviewed scar mobilization (prior to entry in water) * walking forward/ backward unsupported, with cues for neutral Lt foot  * side stepping L/R -> with arm addct / abdct with rainbow hand floats * farmer carry with single rainbow hand float at side  * R/L SLS without support - (challenge!) ;  tandem stance without  support  * tandem gait forward/backward with rainbow hand floats (improved) -> tandem stance -> SLS  * UE on wall:  bilat heel raises x 10, single heel raise x 5;  hip abdct/ addct x 10 each LE * Lt forward step ups x 10,(R retro step downs) with BUE on rail  * L stretch holding rails x 10s * after dried off:   I strip of reg Rock tape applied in zigzag pattern over Lt lateral hip incision to assist with desensitization and scar mobilization   9/30 PROM all planes L hip STM quad and lateral hip  Bridge 2x10 Hooklying ball squeeze 5" 2x10 Prone glute set 5" 2x10 Seated LAQ 5" 2x10   12/23/22 Pt seen for aquatic therapy today.  Treatment took place in water 3.5-4.75 ft in depth at the [facility] pool. Temp of water was 91.  Pt entered/exited the pool via staris using step to pattern with hand rail.  *Intro to setting *stair climbing review. She completes indep  Pt completing properly *walking unsupported Seated on lift: gentle cycling; hip add/abd, flutter kick; Laq small range slow to tolerance *Standing 3.6 ft ue support on wall: DF; PF x10; Left hip add to midline x 5 Right add/abd. *TrA set: 1/2 noodle pull down wide stand then staggered x 10 *decompression: pt has difficulty holding still rolls; able to stay vertical without noodle *bow&arrow 3.6 ft. Cues for weight shift R to left. X 8   Pt requires the buoyancy and hydrostatic pressure of water for support, and to offload joints by unweighting joint load by at least 50 % in navel deep water and by at least 75-80% in chest to neck deep water.  Viscosity of the water is needed for resistance of strengthening. Water current perturbations provides challenge to standing balance requiring increased core activation.    PATIENT EDUCATION:  Education details: aquatic exercise progression/modification  Person educated: Patient Education method: Explanation, Demonstration, Actor cues, Verbal cues, Education comprehension:  verbalized understanding, returned demonstration, verbal cues required, tactile cues required, and needs further education  HOME EXERCISE PROGRAM: Access Code: [email or web address] URL: [email or web address] Date: 12/19/2022 Prepared by: Riki Altes  Exercises - Heel Toe Raises with Counter Support  - 2 x daily - 7 x weekly - 3 sets - 10 reps - Supine Bridge  - 2 x daily - 7 x weekly - 2 sets - 10 reps - Prone Hip Internal Rotation AROM  - 1 x daily - 7 x weekly - 2 sets - 10 reps - Prone Hip External Rotation AROM  - 1 x daily - 7 x weekly - 2 sets - 10 reps - Prone Gluteal Sets  - 2 x daily - 7 x weekly - 2 sets - 10 reps - 5seconds hold  ASSESSMENT:  CLINICAL IMPRESSION: Pt reports recovery from recent fall at [employer] last week.  She is most challenged with SLS and narrow base of support exercises; suggested she work on SLS in her kitchen near counter.  Trial of Rock tape applied to Lt hip incision to decrease sensitivity and mobilize scar.  She tolerated all exercises well with only fatigue reported, no increase in  pain.  Progressing gradually towards goals.   Eval: Patient is a 67 year old female status post left glute med repair 11/04/2022.  She is doing well at this time.  She is walking with a mild antalgic gait without an assistive device.  Her range of motion is within protocol limits without significant pain.  She has a significant past medical history including left hip replacement, RA, and lumbar spine surgery.  She was advised at this time to continue walking at home.  Her pain is minimal at this time.  Exercises cause an exacerbation in pain even though there base exercises.  We will assess next week and add exercises according to protocol.  OBJECTIVE IMPAIRMENTS: Abnormal gait, decreased activity tolerance, decreased knowledge of condition, difficulty walking, decreased ROM, decreased strength, and pain.   ACTIVITY LIMITATIONS: carrying, lifting, bending, sitting, standing,  squatting, stairs, bed mobility, and locomotion level  PARTICIPATION LIMITATIONS: meal prep, cleaning, laundry, shopping, community activity, yard work, and church  PERSONAL FACTORS: 3+ comorbidities: , Left hip replacement, lumbar spine surgery/fusion, right total shoulder replacement, history of unstable angina, some type of syncopal-like episodes that caused her to fall.  are also affecting patient's functional outcome.   REHAB POTENTIAL: Good  CLINICAL DECISION MAKING: Evolving/moderate complexity significant past medical history limiting her ability to perform exercises  EVALUATION COMPLEXITY: Moderate   GOALS: Goals reviewed with patient? Yes  SHORT TERM GOALS: Target date: 12/18/2022   Patient will demonstrate full range of motion within protocol limits at 4 weeks Baseline: Goal status: INITIAL  2.  Patient will ambulate 500 feet without increased pain Baseline:  Goal status: Met 12/23/22  3.  Patient will transfer sit to stand without increased pain Baseline:  Goal status: INITIAL  4.  Patient will be independent with basic exercise protocol Baseline:  Goal status: INITIAL   LONG TERM GOALS: Target date: 01/15/2023    Patient will go up and down stairs with reciprocal pattern without increased pain Baseline:  Goal status: INITIAL  2.  Patient will ambulate community distances as tolerated without increased pain Baseline:  Goal status: INITIAL  3.  Patient will sit for greater than 30 minutes without increased pain Baseline:  Goal status: INITIAL   PLAN:  PT FREQUENCY: 1-2x/week  PT DURATION: 8 weeks  PLANNED INTERVENTIONS: Therapeutic exercises, Therapeutic activity, Neuromuscular re-education, Balance training, Gait training, Patient/Family education, Self Care, Joint mobilization, Stair training, DME instructions, Aquatic Therapy , Dry Needling, Electrical stimulation, Cryotherapy, Moist heat, Taping, Manual therapy, and Re-evaluation.   PLAN FOR  NEXT SESSION:  Consider hip isometrics per protocol, continue with range of motion per protocol, continue to encourage patient to walk.  Mayer Camel, PTA 01/06/23 1:58 PM [facility] [address] [place], [place], [address] Phone: [phone]   Fax:  [phone]

## 2023-01-13 ENCOUNTER — Ambulatory Visit (HOSPITAL_BASED_OUTPATIENT_CLINIC_OR_DEPARTMENT_OTHER): Payer: 59 | Admitting: Physical Therapy

## 2023-01-16 ENCOUNTER — Ambulatory Visit (HOSPITAL_BASED_OUTPATIENT_CLINIC_OR_DEPARTMENT_OTHER): Payer: 59 | Admitting: Physical Therapy

## 2023-01-16 DIAGNOSIS — M25652 Stiffness of left hip, not elsewhere classified: Secondary | ICD-10-CM

## 2023-01-16 DIAGNOSIS — M25552 Pain in left hip: Secondary | ICD-10-CM | POA: Diagnosis not present

## 2023-01-16 DIAGNOSIS — R2689 Other abnormalities of gait and mobility: Secondary | ICD-10-CM

## 2023-01-16 NOTE — Therapy (Addendum)
OUTPATIENT PHYSICAL THERAPY LOWER EXTREMITY TREATMENT PHYSICAL THERAPY DISCHARGE SUMMARY  Visits from Start of Care: 7  Current functional level related to goals / functional outcomes: indep   Remaining deficits: Chronic conditions   Education / Equipment: Management of condition/HEP   Patient agrees to discharge. Patient goals were partially met. Patient is being discharged due to being pleased with the current functional level.  Addend Corrie Dandy Tomma Lightning) Ziemba MPT 05/20/23 11:39 AM [facility] [address] [place], [place], [address] Phone: [phone]   Fax:  [phone]    Patient Name: [name] MRN: [id number] DOB:[birth year], 67 y.o., female Today's Date: 01/16/2023  END OF SESSION:  PT End of Session - 01/16/23 0958     Visit Number 7    Number of Visits 16    Date for PT Re-Evaluation 01/15/23    PT Start Time 0940   therapy late   PT Stop Time 1020    PT Time Calculation (min) 40 min    Activity Tolerance Patient tolerated treatment well    Behavior During Therapy [facility] for tasks assessed/performed                  Past Medical History:  Diagnosis Date   Allergy    Anemia    Anginal pain (HCC)    last time    Anxiety    Arthritis    RHEUMATOID   Asthma    Bipolar 1 disorder (HCC)    Blood transfusion without reported diagnosis    x 3   Cataracts, bilateral 07/2017   CHF (congestive heart failure) (HCC)    COPD (chronic obstructive pulmonary disease) (HCC)    Coronary artery disease    reported hx of "MI";  Echo 2009 with normal LVF;  Myoview 05/2011: no ischemia   Depression    Diabetes mellitus without complication (HCC)    Dyslipidemia    Dysrhythmia    SVT   Elevated liver enzymes 06/30/2022   Esophageal stricture    Fibromyalgia    GERD (gastroesophageal reflux disease)    H/O hiatal hernia    Head injury, unspecified    Headache    migraines   Herpes simplex  infection    History of kidney stones    History of loop recorder 08/10/2017   Managed by Dr. Sharrell Ku   Hyperlipidemia    Hypertension    Insomnia    Myocardial infarction [facility])    age 65   Osteoporosis    Pneumonia    hx   Seizures (HCC)    none in the last 3-4 years as of 11/03/22 per patient   Shortness of breath    Sleep apnea    mild, does not require positive pressure treatment. (02/03/22)   Spinal stenosis of lumbar region    Spondylolisthesis    Status post placement of implantable loop recorder    Supraventricular tachycardia (HCC)    Syncope and collapse    s/p ILR; no arhythmogenic cause identified   UTI (lower urinary tract infection)    Past Surgical History:  Procedure Laterality Date   BACK SURGERY     BREAST EXCISIONAL BIOPSY Right    1990s   BREAST SURGERY     lumpectomy   CARDIAC CATHETERIZATION  10/06/2011   CATARACT EXTRACTION W/PHACO Right 07/31/2017   Procedure: CATARACT EXTRACTION PHACO AND INTRAOCULAR LENS PLACEMENT (IOC);  Surgeon: Fabio Pierce, MD;  Location: [facility];  Service: Ophthalmology;  Laterality: Right;  CDE: 2.33   CATARACT EXTRACTION W/PHACO Left 08/14/2017   Procedure: CATARACT EXTRACTION PHACO AND INTRAOCULAR LENS PLACEMENT (IOC);  Surgeon: Fabio Pierce, MD;  Location: [facility];  Service: Ophthalmology;  Laterality: Left;  CDE: 2.74   CHOLECYSTECTOMY     CYSTOSCOPY     stone   DIAGNOSTIC LAPAROSCOPY     laparoscopic cholecystectomy   DOPPLER ECHOCARDIOGRAPHY  2009   ESOPHAGOGASTRODUODENOSCOPY N/A 10/31/2020   Procedure: ESOPHAGOGASTRODUODENOSCOPY (EGD);  Surgeon: Gaynelle Adu, MD;  Location: [facility];  Service: General;  Laterality: N/A;   EYE SURGERY Bilateral 08/14/2017   cataract removal   GLUTEUS MINIMUS REPAIR Left 11/04/2022   Procedure: LEFT GLUTEUS MEDIUS REPAIR;  Surgeon: Huel Cote, MD;  Location: [facility] OR;  Service: Orthopedics;  Laterality: Left;   head up tilt table testing  06/15/2007   [name]   HEMORRHOID  SURGERY     HERNIA REPAIR     insertion of implatable loop recorder  08/11/2007   [name]   POSTERIOR CERVICAL FUSION/FORAMINOTOMY N/A 12/19/2013   Procedure: RIGHT C3-4.C4-5 AND C5-6 FORAMINOTOMIES;  Surgeon: Kerrin Champagne, MD;  Location: [facility] OR;  Service: Orthopedics;  Laterality: N/A;   TOTAL HIP ARTHROPLASTY Left 01/31/2020   Procedure: LEFT TOTAL HIP ARTHROPLASTY ANTERIOR APPROACH;  Surgeon: Kathryne Hitch, MD;  Location: [facility] OR;  Service: Orthopedics;  Laterality: Left;   TOTAL SHOULDER ARTHROPLASTY Right 11/15/2019   Procedure: RIGHT TOTAL SHOULDER ARTHROPLASTY;  Surgeon: Cammy Copa, MD;  Location: [facility];  Service: Orthopedics;  Laterality: Right;   TOTAL SHOULDER REVISION Right 06/11/2021   Procedure: RIGHT SHOULDER CONVERSION TOTAL SHOULDER ARTHROPLASTY to REVERSE TOTAL SHOULDER ARTHROPLASTY;  Surgeon: Cammy Copa, MD;  Location[facility] OR;  Service: Orthopedics;  Laterality: Right;   TUBAL LIGATION     UPPER GASTROINTESTINAL ENDOSCOPY     XI ROBOTIC ASSISTED HIATAL HERNIA REPAIR N/A 10/31/2020   Procedure: XI ROBOTIC ASSISTED HIATAL HERNIA REPAIR WITH PARTIAL FUNDOPLICATION;  Surgeon: Gaynelle Adu, MD;  Location: [facility];  Service: General;  Laterality: N/A;   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   Hypokalemia 11/25/2020   S/P laparoscopic fundoplication 10/31/2020   Status post total replacement of left hip 01/31/2020   Unilateral primary osteoarthritis, left hip 12/21/2019   S/P shoulder replacement, right 11/15/2019   Overweight (BMI 25.0-29.9) 04/15/2019   Diverticulosis 04/14/2019   Chronic diastolic CHF (congestive heart failure) (HCC) 04/14/2019   Unstable angina (HCC)  10/20/2017   Chronic pain 09/23/2016   Pre-diabetes 09/02/2016   Recurrent major depressive disorder, in partial remission (HCC) 07/14/2016   Seizures (HCC) 07/14/2016   GAD (generalized anxiety disorder) 07/14/2016   Insomnia 09/05/2014   Allergic rhinitis 09/05/2014   Cervical spondylosis without myelopathy 12/19/2013    Class: Chronic   Neural foraminal stenosis of cervical spine 12/19/2013   Cervical radiculitis 09/19/2013   Lumbosacral spondylosis without myelopathy 11/16/2012   Postlaminectomy syndrome, lumbar region 11/16/2012   Herpes simplex virus (HSV) infection 10/31/2008   Hyperlipidemia with target LDL less than 100 10/31/2008   Essential hypertension 10/31/2008   GERD 10/31/2008   SPINAL STENOSIS OF LUMBAR REGION 10/31/2008   Myalgia 10/31/2008   Osteoporosis 10/31/2008   SPONDYLOLISTHESIS 10/31/2008   SYNCOPE 10/31/2008  Sleep apnea 10/31/2008    PCP: Bennie Pierini FNP   REFERRING PROVIDER: Dr Huel Cote   REFERRING DIAG:   THERAPY DIAG:  Pain in left hip  Other abnormalities of gait and mobility  Stiffness of left hip, not elsewhere classified  Rationale for Evaluation and Treatment: Rehabilitation  ONSET DATE: 11/04/222  SUBJECTIVE:   SUBJECTIVE STATEMENT: Patient still has some soreness from her fall.  Overall she feels like she is doing okay.  She has not fallen since her last fall.  She has mild pain at times.  She hopes to continue with HEP at home at this time. PERTINENT HISTORY: Migraines, osteopetrosis, anemia, history of angina, anxiety, rheumatoid arthritis, asthma, bipolar disorder, bilateral cataracts, congestive heart failure, COPD, depression, diabetes, fibromyalgia, history of migraines, history of kidney stones, history of myocardial fraction, osteoporosis, history of seizures although none in the past 3 to 4 years, spinal stenosis with fusion, supraventricular tachycardia, syncope and collapse, cervical fusion, left total hip  arthroplasty, right total shoulder arthroplasty with a revision, PAIN:  Are you having pain? no: NPRS scale: 0/10 Pain location: Pain description:  Aggravating factors: standing and walking  Relieving factors: pain medication   PRECAUTIONS: None  RED FLAGS: None   WEIGHT BEARING RESTRICTIONS: No  FALLS:  Has patient fallen in last 6 months? 2 falls; blacks out sometimes; fell a day after the surgery but on the other hip   LIVING ENVIRONMENT: 2 steps into the house and 15 to the upstairs OCCUPATION:  On disability   Hobbies: Church and playing guitar  PLOF: Independent  PATIENT GOALS:  Less pain   NEXT MD VISIT:  September 18th   OBJECTIVE:   DIAGNOSTIC FINDINGS:  Nothing post op   PATIENT SURVEYS:  FOTO    COGNITION: Overall cognitive status: Within functional limits for tasks assessed     SENSATION: Denies paresthesias   EDEMA:   MUSCLE LENGTH:   POSTURE: No Significant postural limitations  PALPATION: No expected unexpected tenderness palpation  LOWER EXTREMITY ROM:  MMT Right eval Left eval  Hip flexion    Hip extension    Hip abduction    Hip adduction    Hip internal rotation    Hip external rotation    Knee flexion    Knee extension    Ankle dorsiflexion    Ankle plantarflexion    Ankle inversion    Ankle eversion     (Blank rows = not tested) not performed secondary to recent surgery  LOWER EXTREMITY MMT:  Passive range of motion Right eval Left eval Left  10/18  Hip flexion  86 degrees   Hip extension     Hip abduction     Hip adduction     Hip internal rotation  3 degrees   Hip external rotation  Not performed per protocol   Knee flexion     Knee extension     Ankle dorsiflexion     Ankle plantarflexion     Ankle inversion     Ankle eversion      (Blank rows = not tested)    GAIT: Ambulating without assistive device.  Mild decrease in single-leg stance on right leg.  Mild trunk flexion with  ambulation. TODAY'S TREATMENT:  DATE:    10/18  Bridge 2x10 LTR 2x10 in limited motion 2nd to lumbar spine   LAQ 2x10 each leg  Seated hip abduction yellow 2x10 in low range  Seated ball squeeze 2 x 10  Reviewed final HEP   01/06/23 Pt seen for aquatic therapy today.  Treatment took place in water 3.5-4.75 ft in depth at the [facility] pool. Temp of water was 91.  Pt entered/exited the pool via staris using step-to pattern with hand rail.  *reviewed scar mobilization (prior to entry in water) * walking forward/ backward unsupported, with cues for neutral Lt foot  * side stepping L/R -> with arm addct / abdct with rainbow hand floats * farmer carry with single rainbow hand float at side  * R/L SLS without support - (challenge!) ;  tandem stance without support  * tandem gait forward/backward with rainbow hand floats (improved) -> tandem stance -> SLS  * UE on wall:  bilat heel raises x 10, single heel raise x 5;  hip abdct/ addct x 10 each LE * Lt forward step ups x 10,(R retro step downs) with BUE on rail  * L stretch holding rails x 10s * after dried off:   I strip of reg Rock tape applied in zigzag pattern over Lt lateral hip incision to assist with desensitization and scar mobilization   9/30 PROM all planes L hip STM quad and lateral hip  Bridge 2x10 Hooklying ball squeeze 5" 2x10 Prone glute set 5" 2x10 Seated LAQ 5" 2x10   12/23/22 Pt seen for aquatic therapy today.  Treatment took place in water 3.5-4.75 ft in depth at the [facility] pool. Temp of water was 91.  Pt entered/exited the pool via staris using step to pattern with hand rail.  *Intro to setting *stair climbing review. She completes indep  Pt completing properly *walking unsupported Seated on lift: gentle cycling; hip add/abd, flutter kick; Laq small range slow  to tolerance *Standing 3.6 ft ue support on wall: DF; PF x10; Left hip add to midline x 5 Right add/abd. *TrA set: 1/2 noodle pull down wide stand then staggered x 10 *decompression: pt has difficulty holding still rolls; able to stay vertical without noodle *bow&arrow 3.6 ft. Cues for weight shift R to left. X 8   Pt requires the buoyancy and hydrostatic pressure of water for support, and to offload joints by unweighting joint load by at least 50 % in navel deep water and by at least 75-80% in chest to neck deep water.  Viscosity of the water is needed for resistance of strengthening. Water current perturbations provides challenge to standing balance requiring increased core activation.    PATIENT EDUCATION:  Education details: aquatic exercise progression/modification  Person educated: Patient Education method: Explanation, Demonstration, Actor cues, Verbal cues, Education comprehension: verbalized understanding, returned demonstration, verbal cues required, tactile cues required, and needs further education  HOME EXERCISE PROGRAM: Access Code: [email or web address] URL: [email or web address] Date: 12/19/2022 Prepared by: Riki Altes  Exercises - Heel Toe Raises with Counter Support  - 2 x daily - 7 x weekly - 3 sets - 10 reps - Supine Bridge  - 2 x daily - 7 x weekly - 2 sets - 10 reps - Prone Hip Internal Rotation AROM  - 1 x daily - 7 x weekly - 2 sets - 10 reps - Prone Hip External Rotation AROM  - 1 x daily - 7 x weekly - 2 sets - 10 reps - Prone Gluteal Sets  -  2 x daily - 7 x weekly - 2 sets - 10 reps - 5seconds hold  ASSESSMENT:  CLINICAL IMPRESSION: . The patient has progressed well. She has met her  FOTO goal. She has minor pain when she is sleeping. She has more pain in her lower back then her hip. She has been working mostly on walking and some of the exercises on her program. We did advance her program today. She had some soreness following. She was advised she  can work into them as tolerated. She wishes to D/C to HEP at this time. She feels like the drive down is too much at this time.  See below for goal specific progress.  Eval: Patient is a 67 year old female status post left glute med repair 11/04/2022.  She is doing well at this time.  She is walking with a mild antalgic gait without an assistive device.  Her range of motion is within protocol limits without significant pain.  She has a significant past medical history including left hip replacement, RA, and lumbar spine surgery.  She was advised at this time to continue walking at home.  Her pain is minimal at this time.  Exercises cause an exacerbation in pain even though there base exercises.  We will assess next week and add exercises according to protocol.  OBJECTIVE IMPAIRMENTS: Abnormal gait, decreased activity tolerance, decreased knowledge of condition, difficulty walking, decreased ROM, decreased strength, and pain.   ACTIVITY LIMITATIONS: carrying, lifting, bending, sitting, standing, squatting, stairs, bed mobility, and locomotion level  PARTICIPATION LIMITATIONS: meal prep, cleaning, laundry, shopping, community activity, yard work, and church  PERSONAL FACTORS: 3+ comorbidities: , Left hip replacement, lumbar spine surgery/fusion, right total shoulder replacement, history of unstable angina, some type of syncopal-like episodes that caused her to fall.  are also affecting patient's functional outcome.   REHAB POTENTIAL: Good  CLINICAL DECISION MAKING: Evolving/moderate complexity significant past medical history limiting her ability to perform exercises  EVALUATION COMPLEXITY: Moderate   GOALS: Goals reviewed with patient? Yes  SHORT TERM GOALS: Target date: 12/18/2022   Patient will demonstrate full range of motion within protocol limits at 4 weeks Baseline: Goal status: Patient is range of motion doing well.  Patient continues to have mild limitations in hip ER as would be  expected at this time 10/20  2.  Patient will ambulate 500 feet without increased pain Baseline:  Goal status: Met patient can walk about 500 feet without pain but also has baseline issues 10/20  3.  Patient will transfer sit to stand without increased pain Baseline:  Goal status: Achieved 10/15  4.  Patient will be independent with basic exercise protocol Baseline:  Goal status: Patient has base exercise program 10/15   LONG TERM GOALS: Target date: 01/15/2023    Patient will go up and down stairs with reciprocal pattern without increased pain Baseline:  Goal status: Not working on stairs yet 10/15  2.  Patient will ambulate community distances as tolerated without increased pain Baseline:  Goal status: Still has some pain with some issues her baseline  3.  Patient will sit for greater than 30 minutes without increased pain Baseline:  Goal status: Has pain with standing for longer than 10 to 15 minutes  10/15   PLAN:  PT FREQUENCY: 1-2x/week  PT DURATION: 8 weeks  PLANNED INTERVENTIONS: Therapeutic exercises, Therapeutic activity, Neuromuscular re-education, Balance training, Gait training, Patient/Family education, Self Care, Joint mobilization, Stair training, DME instructions, Aquatic Therapy , Dry Needling, Electrical stimulation, Cryotherapy,  Moist heat, Taping, Manual therapy, and Re-evaluation.   PLAN FOR NEXT SESSION:  Consider hip isometrics per protocol, continue with range of motion per protocol, continue to encourage patient to walk.  Mayer Camel, PTA 01/16/23 10:06 AM [facility] [address] [place], [place], [address] Phone: [phone]   Fax:  [phone]

## 2023-01-20 ENCOUNTER — Ambulatory Visit: Payer: 59 | Admitting: Sports Medicine

## 2023-01-22 ENCOUNTER — Encounter: Payer: Self-pay | Admitting: Sports Medicine

## 2023-01-22 ENCOUNTER — Ambulatory Visit: Payer: 59 | Admitting: Sports Medicine

## 2023-01-22 DIAGNOSIS — M79644 Pain in right finger(s): Secondary | ICD-10-CM

## 2023-01-22 DIAGNOSIS — M79645 Pain in left finger(s): Secondary | ICD-10-CM | POA: Diagnosis not present

## 2023-01-22 DIAGNOSIS — M1811 Unilateral primary osteoarthritis of first carpometacarpal joint, right hand: Secondary | ICD-10-CM

## 2023-01-22 DIAGNOSIS — M1812 Unilateral primary osteoarthritis of first carpometacarpal joint, left hand: Secondary | ICD-10-CM

## 2023-01-22 DIAGNOSIS — M25551 Pain in right hip: Secondary | ICD-10-CM

## 2023-01-22 MED ORDER — CELECOXIB 200 MG PO CAPS: 200.0000 mg | ORAL_CAPSULE | Freq: Every day | ORAL | 1 refills | Status: DC

## 2023-01-22 MED ORDER — LIDOCAINE HCL 1 % IJ SOLN
0.5000 mL | INTRAMUSCULAR | Status: AC | PRN
Start: 2023-01-22 — End: 2023-01-22
  Administered 2023-01-22: .5 mL

## 2023-01-22 MED ORDER — METHYLPREDNISOLONE ACETATE 40 MG/ML IJ SUSP
40.0000 mg | INTRAMUSCULAR | Status: AC | PRN
Start: 2023-01-22 — End: 2023-01-22
  Administered 2023-01-22: 40 mg via INTRA_ARTICULAR

## 2023-01-22 NOTE — Progress Notes (Signed)
[name] - 67 y.o. female MRN [id number]  Date of birth: 1956-01-08  Office Visit Note: Visit Date: 01/22/2023 PCP: Erica Pierini, FNP Referred by: [name], [name], *  Subjective: Chief Complaint  Patient presents with   Right Hand - Pain   Left Hand - Pain   HPI: [name] is a pleasant 67 y.o. female who presents today for bilateral thumb pain with known CMC joint arthritis.  Bilateral thumbs [facility]) -still bothering her.  We did perform CMC joint injections almost 4 months ago which gave her good relief for at least 3 months but the last radicular pain has become reexacerbated.  Left is worse than right.  Using Celebrex 200 mg once daily, but is out of this and needs refill. Also on her chronic hydrocodone-acetaminophen for pain relief.   She is also s/p left hip gluteus medius repair on 11/04/22 - doing well from this. Involved in PT. Her right lateral hip is doing relatively well, but recently started to bother her again.  Does have home exercises she will do for both hips.  Pertinent ROS were reviewed with the patient and found to be negative unless otherwise specified above in HPI.   Assessment & Plan: Visit Diagnoses:  1. Bilateral thumb pain   2. Primary osteoarthritis of first carpometacarpal joint of left hand   3. Primary osteoarthritis of first carpometacarpal joint of right hand   4. Greater trochanteric pain syndrome of right lower extremity    Plan: [name] is dealing with an exacerbation of her chronic bilateral thumb pain with advancing CMC joint arthritis.  Her left CMC joint thumb is quite severe, her right is mild to moderate.  Through shared decision-making, did proceed with bilateral CMC joint injections, patient tolerated well with mild pain.  She will remain in her [facility] cool comfort braces for support.  I did refill her Celebrex which she will take 200 mg once daily with food. She will continue her chronic hydrocodone-acetaminophen  7.5-325 mg 3 times a day for pain control.  Her left hip is doing excellent after gluteus repair.  Her right hip is starting to slowly bother her again, we discussed options such as injection, shockwave therapy.  She will follow-up in about 2 weeks for reevaluation and consideration of further treatment. Continue HEP.  She does have a pacemaker however which may limit her use of shockwave therapy.  Follow-up: Return in about 2 weeks (around 02/05/2023) for for US-guided R-lat hip (consider GT inj), f/u thumbs.   Meds & Orders:  Meds ordered this encounter  Medications   celecoxib (CELEBREX) 200 MG capsule    Sig: Take 1 capsule (200 mg total) by mouth daily.    Dispense:  30 capsule    Refill:  1    Orders Placed This Encounter  Procedures   Small Joint Inj: L thumb CMC   Small Joint Inj: R thumb CMC     Procedures: Small Joint Inj: L thumb CMC on 01/22/2023 9:07 AM Indications: pain Details: 25 G needle, radial approach Medications: 0.5 mL lidocaine 1 %; 40 mg methylPREDNISolone acetate 40 MG/ML Outcome: tolerated well, no immediate complications Procedure, treatment alternatives, risks and benefits explained, specific risks discussed. Consent was given by the patient. Immediately prior to procedure a time out was called to verify the correct patient, procedure, equipment, support staff and site/side marked as required. Patient was prepped and draped in the usual sterile fashion.    Small Joint Inj: R thumb CMC on  01/22/2023 9:09 AM Indications: pain Details: 25 G needle, radial approach Medications: 0.5 mL lidocaine 1 %; 40 mg methylPREDNISolone acetate 40 MG/ML Outcome: tolerated well, no immediate complications Procedure, treatment alternatives, risks and benefits explained, specific risks discussed. Consent was given by the patient. Immediately prior to procedure a time out was called to verify the correct patient, procedure, equipment, support staff and site/side marked as  required. Patient was prepped and draped in the usual sterile fashion.          Clinical History: No specialty comments available.  She reports that she has never smoked. She has never used smokeless tobacco.  Recent Labs    09/23/22 1516  HGBA1C 5.7*    Objective:    Physical Exam  Gen: Well-appearing, in no acute distress; non-toxic CV:  Well-perfused. Warm.  Resp: Breathing unlabored on room air; no wheezing. Psych: Fluid speech in conversation; appropriate affect; normal thought process Neuro: Sensation intact throughout. No gross coordination deficits.   Ortho Exam - Bilateral thumbs: Notable bony bossing of both CMC joints but left greater than right.  There is positive TTP and positive CMC grind test bilaterally.  Cap refill less than 2 seconds.    Imaging:  07/10/22:  - 4 views of the left hand including AP, lateral, oblique and Su Hilt view  was ordered and reviewed by myself.  X-rays demonstrate severe  osteoarthritic change at the [facility] joint about the thumb with bony sclerosis  and spurring.  There is some mild to moderate STT arthritic change as  well.  Negative ulnar variance.   - 4 views of the right hand including AP, lateral, oblique and Su Hilt view  was ordered and reviewed by myself.  X-rays demonstrate mild-moderate  osteoarthritic change at the [facility] joint about the thumb with some bony  spurring over the radial aspect of the joint, the medial aspect is  relatively well-preserved.  Negative ulnar variance.   Past Medical/Family/Surgical/Social History: Medications & Allergies reviewed per EMR, new medications updated. 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   Hypokalemia 11/25/2020    S/P laparoscopic fundoplication 10/31/2020   Status post total replacement of left hip 01/31/2020   Unilateral primary osteoarthritis, left hip 12/21/2019   S/P shoulder replacement, right 11/15/2019   Overweight (BMI 25.0-29.9) 04/15/2019   Diverticulosis 04/14/2019   Chronic diastolic CHF (congestive heart failure) (HCC) 04/14/2019   Unstable angina (HCC) 10/20/2017   Chronic pain 09/23/2016   Pre-diabetes 09/02/2016   Recurrent major depressive disorder, in partial remission (HCC) 07/14/2016   Seizures (HCC) 07/14/2016   GAD (generalized anxiety disorder) 07/14/2016   Insomnia 09/05/2014   Allergic rhinitis 09/05/2014   Cervical spondylosis without myelopathy 12/19/2013    Class: Chronic   Neural foraminal stenosis of cervical spine 12/19/2013   Cervical radiculitis 09/19/2013   Lumbosacral spondylosis without myelopathy 11/16/2012   Postlaminectomy syndrome, lumbar region 11/16/2012   Herpes simplex virus (HSV) infection 10/31/2008   Hyperlipidemia with target LDL less than 100 10/31/2008   Essential hypertension 10/31/2008   GERD 10/31/2008   SPINAL STENOSIS OF LUMBAR REGION 10/31/2008   Myalgia 10/31/2008   Osteoporosis 10/31/2008   SPONDYLOLISTHESIS 10/31/2008   SYNCOPE 10/31/2008   Sleep apnea 10/31/2008   Past Medical History:  Diagnosis Date   Allergy    Anemia    Anginal pain (HCC)    last time    Anxiety    Arthritis    RHEUMATOID   Asthma    Bipolar 1 disorder (HCC)    Blood transfusion without reported diagnosis    x 3   Cataracts, bilateral 07/2017   CHF (congestive heart failure) (HCC)    COPD (chronic obstructive pulmonary disease) (HCC)    Coronary artery disease    reported hx of "MI";  Echo 2009 with normal LVF;  Myoview 05/2011: no ischemia   Depression    Diabetes mellitus without complication (HCC)    Dyslipidemia    Dysrhythmia    SVT   Elevated liver enzymes 06/30/2022   Esophageal stricture    Fibromyalgia    GERD (gastroesophageal  reflux disease)    H/O hiatal hernia    Head injury, unspecified    Headache    migraines   Herpes simplex infection    History of kidney stones    History of loop recorder 08/10/2017   Managed by Dr. Sharrell Ku   Hyperlipidemia    Hypertension    Insomnia    Myocardial infarction [facility])    age 57   Osteoporosis    Pneumonia    hx   Seizures (HCC)    none in the last 3-4 years as of 11/03/22 per patient   Shortness of breath    Sleep apnea    mild, does not require positive pressure treatment. (02/03/22)   Spinal stenosis of lumbar region    Spondylolisthesis    Status post placement of implantable loop recorder    Supraventricular tachycardia (HCC)    Syncope and collapse    s/p ILR; no arhythmogenic cause identified   UTI (lower urinary tract infection)    Family History  Problem Relation Age of Onset   Heart attack Father    Mental illness Father    Mental illness Mother    Heart attack Brother        stents   Alcohol abuse Brother    Heart disease Brother    Drug abuse Brother    Diabetes Brother    Colon cancer Maternal Aunt    Cirrhosis Brother    Stomach cancer Neg Hx    Esophageal cancer Neg Hx    Rectal cancer Neg Hx    Past Surgical History:  Procedure Laterality Date   BACK SURGERY     BREAST EXCISIONAL BIOPSY Right    1990s   BREAST SURGERY     lumpectomy   CARDIAC CATHETERIZATION  10/06/2011   CATARACT EXTRACTION W/PHACO Right 07/31/2017   Procedure: CATARACT EXTRACTION PHACO AND INTRAOCULAR LENS PLACEMENT (IOC);  Surgeon: Fabio Pierce, MD;  Location: [facility];  Service: Ophthalmology;  Laterality: Right;  CDE: 2.33   CATARACT EXTRACTION W/PHACO Left 08/14/2017   Procedure: CATARACT EXTRACTION PHACO AND INTRAOCULAR LENS PLACEMENT (IOC);  Surgeon: Fabio Pierce, MD;  Location: [facility];  Service: Ophthalmology;  Laterality: Left;  CDE: 2.74   CHOLECYSTECTOMY     CYSTOSCOPY     stone   DIAGNOSTIC LAPAROSCOPY     laparoscopic cholecystectomy    DOPPLER ECHOCARDIOGRAPHY  2009   ESOPHAGOGASTRODUODENOSCOPY N/A 10/31/2020   Procedure: ESOPHAGOGASTRODUODENOSCOPY (EGD);  Surgeon: Gaynelle Adu, MD;  Location: [facility];  Service: General;  Laterality: N/A;   EYE SURGERY Bilateral 08/14/2017   cataract removal   GLUTEUS MINIMUS REPAIR Left 11/04/2022   Procedure: LEFT GLUTEUS  MEDIUS REPAIR;  Surgeon: Huel Cote, MD;  Location: [facility] OR;  Service: Orthopedics;  Laterality: Left;   head up tilt table testing  06/15/2007   [name]   HEMORRHOID SURGERY     HERNIA REPAIR     insertion of implatable loop recorder  08/11/2007   [name]   POSTERIOR CERVICAL FUSION/FORAMINOTOMY N/A 12/19/2013   Procedure: RIGHT C3-4.C4-5 AND C5-6 FORAMINOTOMIES;  Surgeon: Kerrin Champagne, MD;  Location: [facility] OR;  Service: Orthopedics;  Laterality: N/A;   TOTAL HIP ARTHROPLASTY Left 01/31/2020   Procedure: LEFT TOTAL HIP ARTHROPLASTY ANTERIOR APPROACH;  Surgeon: Kathryne Hitch, MD;  Location: [facility] OR;  Service: Orthopedics;  Laterality: Left;   TOTAL SHOULDER ARTHROPLASTY Right 11/15/2019   Procedure: RIGHT TOTAL SHOULDER ARTHROPLASTY;  Surgeon: Cammy Copa, MD;  Location: [facility];  Service: Orthopedics;  Laterality: Right;   TOTAL SHOULDER REVISION Right 06/11/2021   Procedure: RIGHT SHOULDER CONVERSION TOTAL SHOULDER ARTHROPLASTY to REVERSE TOTAL SHOULDER ARTHROPLASTY;  Surgeon: Cammy Copa, MD;  Location: [facility] OR;  Service: Orthopedics;  Laterality: Right;   TUBAL LIGATION     UPPER GASTROINTESTINAL ENDOSCOPY     XI ROBOTIC ASSISTED HIATAL HERNIA REPAIR N/A 10/31/2020   Procedure: XI ROBOTIC ASSISTED HIATAL HERNIA REPAIR WITH PARTIAL FUNDOPLICATION;  Surgeon: Gaynelle Adu, MD;  Location: [facility];  Service: General;  Laterality: N/A;   Social History   Occupational History   Occupation: Disability    Comment: 15 years  Tobacco Use   Smoking status: Never   Smokeless tobacco: Never  Vaping Use   Vaping status: Never Used  Substance  and Sexual Activity   Alcohol use: No    Alcohol/week: 0.0 standard drinks of alcohol   Drug use: No   Sexual activity: Yes    Birth control/protection: Post-menopausal

## 2023-01-28 ENCOUNTER — Other Ambulatory Visit: Payer: Self-pay | Admitting: Nurse Practitioner

## 2023-01-28 ENCOUNTER — Ambulatory Visit (INDEPENDENT_AMBULATORY_CARE_PROVIDER_SITE_OTHER): Payer: 59 | Admitting: Orthopaedic Surgery

## 2023-01-28 ENCOUNTER — Telehealth (INDEPENDENT_AMBULATORY_CARE_PROVIDER_SITE_OTHER): Payer: 59 | Admitting: Nurse Practitioner

## 2023-01-28 ENCOUNTER — Encounter: Payer: Self-pay | Admitting: Nurse Practitioner

## 2023-01-28 DIAGNOSIS — M67952 Unspecified disorder of synovium and tendon, left thigh: Secondary | ICD-10-CM | POA: Diagnosis not present

## 2023-01-28 DIAGNOSIS — J069 Acute upper respiratory infection, unspecified: Secondary | ICD-10-CM | POA: Diagnosis not present

## 2023-01-28 DIAGNOSIS — R0789 Other chest pain: Secondary | ICD-10-CM

## 2023-01-28 MED ORDER — TRIAMCINOLONE ACETONIDE 40 MG/ML IJ SUSP
80.0000 mg | INTRAMUSCULAR | Status: AC | PRN
  Administered 2023-01-28: 80 mg via INTRA_ARTICULAR

## 2023-01-28 MED ORDER — LIDOCAINE HCL 1 % IJ SOLN
4.0000 mL | INTRAMUSCULAR | Status: AC | PRN
  Administered 2023-01-28: 4 mL

## 2023-01-28 NOTE — Progress Notes (Signed)
Virtual Visit via video Note Due to COVID-19 pandemic this visit was conducted virtually. This visit type was conducted due to national recommendations for restrictions regarding the COVID-19 Pandemic (e.g. social distancing, sheltering in place) in an effort to limit this patient's exposure and mitigate transmission in our community. All issues noted in this document were discussed and addressed.  A physical exam was not performed with this format.   I connected with [name] on 01/28/2023 at 130 by name and DOB and verified that I am speaking with the correct person using two identifiers. [name] is currently located at home  currently with them during visit. The provider, Martina Sinner, DNP is located in their office at time of visit.  I discussed the limitations, risks, security and privacy concerns of performing an evaluation and management service by virtual visit and the availability of in person appointments. I also discussed with the patient that there may be a patient responsible charge related to this service. The patient expressed understanding and agreed to proceed.  Subjective:  Patient ID: [name], female    DOB: 10/06/1955, 67 y.o.   MRN: [id number]  Chief Complaint:  Cough   HPI: [name] is a 67 y.o. female presenting on 01/28/2023 via telehealth for Cough Cough: Patient complains of productive cough with sputum described as yellow.  Symptoms began 2 days ago.  The cough is productive of green/yellow sputum and is aggravated by nothing Associated symptoms include:chills and fever. Patient does not have new pets. Patient does have a history of environmental allergens. Patient does not have recent travel. Patient does not have a history of smoking. Patient  does not have previous Chest X-ray.  She is instructed to come to the clinic to get swabbed for flu and COVID  Active Ambulatory Problems    Diagnosis Date Noted   Herpes  simplex virus (HSV) infection 10/31/2008   Hyperlipidemia with target LDL less than 100 10/31/2008   Essential hypertension 10/31/2008   GERD 10/31/2008   SPINAL STENOSIS OF LUMBAR REGION 10/31/2008   Myalgia 10/31/2008   Osteoporosis 10/31/2008   SPONDYLOLISTHESIS 10/31/2008   SYNCOPE 10/31/2008   Sleep apnea 10/31/2008   Lumbosacral spondylosis without myelopathy 11/16/2012   Postlaminectomy syndrome, lumbar region 11/16/2012   Cervical radiculitis 09/19/2013   Cervical spondylosis without myelopathy 12/19/2013   Neural foraminal stenosis of cervical spine 12/19/2013   Insomnia 09/05/2014   Allergic rhinitis 09/05/2014   Recurrent major depressive disorder, in partial remission (HCC) 07/14/2016   Seizures (HCC) 07/14/2016   GAD (generalized anxiety disorder) 07/14/2016   Pre-diabetes 09/02/2016   Chronic pain 09/23/2016   Unstable angina (HCC) 10/20/2017   Diverticulosis 04/14/2019   Chronic diastolic CHF (congestive heart failure) (HCC) 04/14/2019   Overweight (BMI 25.0-29.9) 04/15/2019   S/P shoulder replacement, right 11/15/2019   Unilateral primary osteoarthritis, left hip 12/21/2019   Status post total replacement of left hip 01/31/2020   S/P laparoscopic fundoplication 10/31/2020   Hypokalemia 11/25/2020   S/P reverse total shoulder arthroplasty, right 06/11/2021   Instability of prosthetic shoulder joint (HCC)    Tear of left gluteus minimus tendon 11/04/2022   Acute cystitis with hematuria 11/26/2022   Vaginal discharge 11/26/2022   Gonorrhea 12/03/2022   Resolved Ambulatory Problems    Diagnosis Date Noted   Depression 10/31/2008   PNEUMONIA 10/31/2008   Asthma 10/31/2008   Head injury 10/31/2008   Chest pain, unspecified 09/30/2011   Sinus tachycardia 10/22/2011  Dysphagia 08/05/2013   Preop cardiovascular exam 11/24/2013   Chest pain 11/29/2014   Diverticulitis of colon 08/24/2015   Moderate episode of recurrent major depressive disorder (HCC)  07/14/2016   Urinary urgency 07/14/2016   Acute pain of right shoulder 07/19/2018   Chest pain 01/17/2019   Abnormal blood sugar 09/29/2019   Left ear pain 01/10/2020   Non-recurrent acute serous otitis media of both ears 01/10/2020   Dysuria 01/10/2020   Syncope 11/25/2020   Past Medical History:  Diagnosis Date   Allergy    Anemia    Anginal pain (HCC)    Anxiety    Arthritis    Asthma    Bipolar 1 disorder (HCC)    Blood transfusion without reported diagnosis    Cataracts, bilateral 07/2017   CHF (congestive heart failure) (HCC)    COPD (chronic obstructive pulmonary disease) (HCC)    Coronary artery disease    Diabetes mellitus without complication (HCC)    Dyslipidemia    Dysrhythmia    Elevated liver enzymes 06/30/2022   Esophageal stricture    Fibromyalgia    GERD (gastroesophageal reflux disease)    H/O hiatal hernia    Head injury, unspecified    Headache    Herpes simplex infection    History of kidney stones    History of loop recorder 08/10/2017   Hyperlipidemia    Hypertension    Myocardial infarction (HCC)    Osteoporosis    Pneumonia    Shortness of breath    Spinal stenosis of lumbar region    Spondylolisthesis    Status post placement of implantable loop recorder    Supraventricular tachycardia (HCC)    UTI (lower urinary tract infection)     Relevant past medical, surgical, family, and social history reviewed and updated as indicated.  Allergies and medications reviewed and updated.   Past Medical History:  Diagnosis Date   Allergy    Anemia    Anginal pain (HCC)    last time    Anxiety    Arthritis    RHEUMATOID   Asthma    Bipolar 1 disorder (HCC)    Blood transfusion without reported diagnosis    x 3   Cataracts, bilateral 07/2017   CHF (congestive heart failure) (HCC)    COPD (chronic obstructive pulmonary disease) (HCC)    Coronary artery disease    reported hx of "MI";  Echo 2009 with normal LVF;  Myoview 05/2011: no  ischemia   Depression    Diabetes mellitus without complication (HCC)    Dyslipidemia    Dysrhythmia    SVT   Elevated liver enzymes 06/30/2022   Esophageal stricture    Fibromyalgia    GERD (gastroesophageal reflux disease)    H/O hiatal hernia    Head injury, unspecified    Headache    migraines   Herpes simplex infection    History of kidney stones    History of loop recorder 08/10/2017   Managed by Dr. Sharrell Ku   Hyperlipidemia    Hypertension    Insomnia    Myocardial infarction [facility])    age 41   Osteoporosis    Pneumonia    hx   Seizures (HCC)    none in the last 3-4 years as of 11/03/22 per patient   Shortness of breath    Sleep apnea    mild, does not require positive pressure treatment. (02/03/22)   Spinal stenosis of lumbar region    Spondylolisthesis  Status post placement of implantable loop recorder    Supraventricular tachycardia (HCC)    Syncope and collapse    s/p ILR; no arhythmogenic cause identified   UTI (lower urinary tract infection)     Past Surgical History:  Procedure Laterality Date   BACK SURGERY     BREAST EXCISIONAL BIOPSY Right    1990s   BREAST SURGERY     lumpectomy   CARDIAC CATHETERIZATION  10/06/2011   CATARACT EXTRACTION W/PHACO Right 07/31/2017   Procedure: CATARACT EXTRACTION PHACO AND INTRAOCULAR LENS PLACEMENT (IOC);  Surgeon: Fabio Pierce, MD;  Location: [facility];  Service: Ophthalmology;  Laterality: Right;  CDE: 2.33   CATARACT EXTRACTION W/PHACO Left 08/14/2017   Procedure: CATARACT EXTRACTION PHACO AND INTRAOCULAR LENS PLACEMENT (IOC);  Surgeon: Fabio Pierce, MD;  Location: [facility];  Service: Ophthalmology;  Laterality: Left;  CDE: 2.74   CHOLECYSTECTOMY     CYSTOSCOPY     stone   DIAGNOSTIC LAPAROSCOPY     laparoscopic cholecystectomy   DOPPLER ECHOCARDIOGRAPHY  2009   ESOPHAGOGASTRODUODENOSCOPY N/A 10/31/2020   Procedure: ESOPHAGOGASTRODUODENOSCOPY (EGD);  Surgeon: Gaynelle Adu, MD;  Location: [facility];   Service: General;  Laterality: N/A;   EYE SURGERY Bilateral 08/14/2017   cataract removal   GLUTEUS MINIMUS REPAIR Left 11/04/2022   Procedure: LEFT GLUTEUS MEDIUS REPAIR;  Surgeon: Huel Cote, MD;  Location: [facility] OR;  Service: Orthopedics;  Laterality: Left;   head up tilt table testing  06/15/2007   [name]   HEMORRHOID SURGERY     HERNIA REPAIR     insertion of implatable loop recorder  08/11/2007   [name]   POSTERIOR CERVICAL FUSION/FORAMINOTOMY N/A 12/19/2013   Procedure: RIGHT C3-4.C4-5 AND C5-6 FORAMINOTOMIES;  Surgeon: Kerrin Champagne, MD;  Location: [facility] OR;  Service: Orthopedics;  Laterality: N/A;   TOTAL HIP ARTHROPLASTY Left 01/31/2020   Procedure: LEFT TOTAL HIP ARTHROPLASTY ANTERIOR APPROACH;  Surgeon: Kathryne Hitch, MD;  Location: [facility] OR;  Service: Orthopedics;  Laterality: Left;   TOTAL SHOULDER ARTHROPLASTY Right 11/15/2019   Procedure: RIGHT TOTAL SHOULDER ARTHROPLASTY;  Surgeon: Cammy Copa, MD;  Location: [facility];  Service: Orthopedics;  Laterality: Right;   TOTAL SHOULDER REVISION Right 06/11/2021   Procedure: RIGHT SHOULDER CONVERSION TOTAL SHOULDER ARTHROPLASTY to REVERSE TOTAL SHOULDER ARTHROPLASTY;  Surgeon: Cammy Copa, MD;  Location: [facility] OR;  Service: Orthopedics;  Laterality: Right;   TUBAL LIGATION     UPPER GASTROINTESTINAL ENDOSCOPY     XI ROBOTIC ASSISTED HIATAL HERNIA REPAIR N/A 10/31/2020   Procedure: XI ROBOTIC ASSISTED HIATAL HERNIA REPAIR WITH PARTIAL FUNDOPLICATION;  Surgeon: Gaynelle Adu, MD;  Location: [facility];  Service: General;  Laterality: N/A;    Social History   Socioeconomic History   Marital status: Single    Spouse name: Not on file   Number of children: 5   Years of education: Not on file   Highest education level: 5th grade  Occupational History   Occupation: Disability    Comment: 15 years  Tobacco Use   Smoking status: Never   Smokeless tobacco: Never  Vaping Use   Vaping status: Never Used   Substance and Sexual Activity   Alcohol use: No    Alcohol/week: 0.0 standard drinks of alcohol   Drug use: No   Sexual activity: Yes    Birth control/protection: Post-menopausal  Other Topics Concern   Not on file  Social History Narrative   Patient lives alone - 2 granddaughters live nearby - children  are very involved   Patient is on disability.    Patient has 5th grade education.    Patient has 5 children, 24 grand-chilren, and 5 great-grand children.    Right handed    Social Determinants of Health   Financial Resource Strain: Low Risk  (10/08/2022)   Overall Financial Resource Strain (CARDIA)    Difficulty of Paying Living Expenses: Not hard at all  Food Insecurity: No Food Insecurity (10/08/2022)   Hunger Vital Sign    Worried About Running Out of Food in the Last Year: Never true    Ran Out of Food in the Last Year: Never true  Transportation Needs: No Transportation Needs (10/08/2022)   PRAPARE - Administrator, Civil Service (Medical): No    Lack of Transportation (Non-Medical): No  Physical Activity: Insufficiently Active (10/08/2022)   Exercise Vital Sign    Days of Exercise per Week: 3 days    Minutes of Exercise per Session: 30 min  Stress: No Stress Concern Present (10/08/2022)   [employer] of Occupational Health - Occupational Stress Questionnaire    Feeling of Stress : Not at all  Social Connections: Moderately Isolated (10/08/2022)   Social Connection and Isolation Panel [NHANES]    Frequency of Communication with Friends and Family: More than three times a week    Frequency of Social Gatherings with Friends and Family: More than three times a week    Attends Religious Services: More than 4 times per year    Active Member of [employer] or Organizations: No    Attends Banker Meetings: Never    Marital Status: Divorced  Catering manager Violence: Not At Risk (10/08/2022)   Humiliation, Afraid, Rape, and Kick questionnaire    Fear of  Current or Ex-Partner: No    Emotionally Abused: No    Physically Abused: No    Sexually Abused: No    Outpatient Encounter Medications as of 01/28/2023  Medication Sig   Accu-Chek Softclix Lancets lancets TEST BLOOD SUGAR UP TO 4 TIMES A DAY AS DIRECTED. R73.03   acyclovir ointment (ZOVIRAX) 5 % apply 1 application topically every 3 (three) hours as needed (outbreaks).   albuterol (VENTOLIN HFA) 108 (90 Base) MCG/ACT inhaler INHALE 2 PUFFS EVERY 6 HOURS AS NEEDED FOR SHORTNESS OF BREATH AND WHEEZING.   Alcohol Swabs (B-D SINGLE USE SWABS REGULAR) PADS USE 1 PAD DAILY WHEN CHECKING BLOOD SUGAR. R73.03   aspirin EC 325 MG tablet Take 1 tablet (325 mg total) by mouth daily.   blood glucose meter kit and supplies Dispense based on patient and insurance preference. Use up to four times daily as directed. (FOR ICD-10 E10.9, E11.9).   busPIRone (BUSPAR) 10 MG tablet Take 1 tablet (10 mg total) by mouth 2 (two) times daily as needed.   butalbital-acetaminophen-caffeine (FIORICET) 50-325-40 MG tablet take 1 tablet by mouth every 6 (six) hours as needed for headache.   carvedilol (COREG) 3.125 MG tablet TAKE 1/2 (0.5) TABLET TWICE A DAY WITH A MEAL; BLOOD PRESSURE OVER 150 BEFORE TAKING.   celecoxib (CELEBREX) 200 MG capsule Take 1 capsule (200 mg total) by mouth daily.   dexlansoprazole (DEXILANT) 60 MG capsule Take 1 capsule (60 mg total) by mouth daily.   diclofenac Sodium (VOLTAREN) 1 % GEL APPLY 4 GRAMS TOPICALLY 4 TIMES A DAY. (Patient taking differently: Apply 4 g topically 4 (four) times daily as needed (pain).)   DULoxetine (CYMBALTA) 60 MG capsule Take 1 capsule (60 mg total) by mouth  at bedtime.   empagliflozin (JARDIANCE) 25 MG TABS tablet Take 1 tablet (25 mg total) by mouth daily.   escitalopram (LEXAPRO) 20 MG tablet Take 1 tablet (20 mg total) by mouth daily.   estradiol (ESTRACE) 0.1 MG/GM vaginal cream Place 1 Applicatorful vaginally 3 (three) times a week.   FIBER PO Take 1 tablet  by mouth in the morning and at bedtime.   fluticasone (FLONASE) 50 MCG/ACT nasal spray USE 1 SPRAY IN EACH NOSTRIL ONCE DAILY. (Patient taking differently: Place 1 spray into both nostrils daily as needed for allergies.)   fluticasone furoate-vilanterol (BREO ELLIPTA) 100-25 MCG/ACT AEPB Inhale 1 puff into the lungs daily. (Patient taking differently: Inhale 1 puff into the lungs daily as needed (respiratory issues.).)   furosemide (LASIX) 40 MG tablet TAKE 1 TABLET DAILY AS NEEDED FOR EXCESSIVE FLUID.   Glucagon, rDNA, (GLUCAGON EMERGENCY) 1 MG KIT Inject 1 mg into the skin as needed.   glucose blood (ACCU-CHEK GUIDE) test strip TEST BLOOD SUGAR UP TO 4 TIMES A DAY AS DIRECTED. R73.03   hydrALAZINE (APRESOLINE) 25 MG tablet TAKE 1 TABLET BY MOUTH 3 TIMES DAILY AS NEEDED (FOR SEVERE HYPERTENSION/SYSTOLIC NUMBER 170 OR GREATER).   [START ON 02/20/2023] HYDROcodone-acetaminophen (NORCO) 7.5-325 MG tablet Take 1 tablet by mouth in the morning, at noon, and at bedtime.   HYDROcodone-acetaminophen (NORCO) 7.5-325 MG tablet Take 1 tablet by mouth in the morning, at noon, and at bedtime.   ipratropium (ATROVENT) 0.02 % nebulizer solution USE 1 VIAL ( ) IN NEBULIZER EVERY 6 HOURS AS NEEDED FOR WHEEZING OR SHORTNESS OF BREATH.   isosorbide mononitrate (IMDUR) 60 MG 24 hr tablet Take 1 tablet (60 mg total) by mouth daily. (Patient taking differently: Take 90 mg by mouth in the morning.)   lamoTRIgine (LAMICTAL) 150 MG tablet Take 1 tablet (150 mg total) by mouth daily.   levETIRAcetam (KEPPRA) 500 MG tablet Take 1 tablet (500 mg total) by mouth 2 (two) times daily.   levocetirizine (XYZAL) 5 MG tablet TAKE 1 TABLET BY MOUTH EVERY MORNING. (Patient taking differently: Take 5 mg by mouth daily as needed for allergies.)   lidocaine (LIDODERM) 5 % Place 1 patch onto the skin daily. Remove & Discard patch within 12 hours or as directed by MD (Patient taking differently: Place 1 patch onto the skin daily as needed  (pain.). Remove & Discard patch within 12 hours or as directed by MD)   lidocaine (XYLOCAINE) 5 % ointment APPLY TO AFFECTED AREA 3 TIMES A DAY AS NEEDED FOR MILD OR MODERATE PAIN.   methocarbamol (ROBAXIN) 500 MG tablet Take 1 tablet (500 mg total) by mouth 4 (four) times daily.   nitroGLYCERIN (NITROSTAT) 0.4 MG SL tablet place one (1) tablet under tongue every 5 minutes up to (3) doses as needed for chest pain.   ondansetron (ZOFRAN) 4 MG tablet take 1 tablet by mouth every 8 hours as needed for nausea and vomiting.   oxybutynin (DITROPAN-XL) 5 MG 24 hr tablet Take 1 tablet (5 mg total) by mouth at bedtime.   Probiotic Product (PROBIOTIC PO) Take 1 capsule by mouth in the morning.   rosuvastatin (CRESTOR) 10 MG tablet Take 1 tablet (10 mg total) by mouth daily.   traZODone (DESYREL) 150 MG tablet Take 1 tablet (150 mg total) by mouth at bedtime.   valACYclovir (VALTREX) 1000 MG tablet Take 1 tablet (1,000 mg total) by mouth 3 (three) times daily as needed (with active outbreaks.).   valACYclovir (VALTREX) 500 MG  tablet TAKE (1) TABLET TWICE DAILY.   [DISCONTINUED] fludrocortisone (FLORINEF) 0.1 MG tablet take 1 tablet ( total) by mouth every other day.   [EXPIRED] lidocaine (XYLOCAINE) 1 % (with pres) injection 4 mL    [EXPIRED] triamcinolone acetonide (KENALOG-40) injection 80 mg    No facility-administered encounter medications on file as of 01/28/2023.    Allergies  Allergen Reactions   Codeine Other (See Comments)    "I will have a heart attack."   Morphine And Codeine Other (See Comments)    "It will cause me to have a heart attack."   Ambien [Zolpidem Tartrate] Nausea And Vomiting   Clonidine Derivatives Nausea And Vomiting    gerd - caused acid reflux per pt   Metformin And Related Nausea And Vomiting    cramping from metformin   Lyrica [Pregabalin] Swelling and Other (See Comments)    Weight gain   Neurontin [Gabapentin] Other (See Comments)    Causes elevated LFTs      Review of Systems  Constitutional:  Positive for chills and fever.       Low-grade 101 relieved with ibuprofen  HENT:  Positive for congestion.   Respiratory:  Positive for cough. Negative for chest tightness and shortness of breath.        For 2 days green sputum  Cardiovascular:  Negative for chest pain and leg swelling.  Gastrointestinal:  Negative for diarrhea and vomiting.  Genitourinary:  Negative for frequency, hematuria and urgency.  Musculoskeletal:  Positive for myalgias.  Skin:  Negative for rash.  Neurological:  Negative for dizziness and headaches.    Observations/Objective: No vital signs or physical exam, this was a virtual health encounter.  Pt alert and oriented, answers all questions appropriately, and able to speak in full sentences.    Assessment and Plan: Erica "[name]" was seen today for cough.  Diagnoses and all orders for this visit:  Upper respiratory infection with cough and congestion -     COVID-19, Flu A+B and RSV   Prominent was very 67 year old female seen via telehealth no acute distress She will come to the clinic and supplement today to get swabbed for flu and COVID Tylenol or ibuprofen for fever Increase hydration, rest advised to wear masks when new or the people while waiting for COVID result Cough It appears that you have a viral upper respiratory infection (cold).  Cold symptoms can last up to 2 weeks.  I recommend that you only use cold medications that are safe in high blood pressure like Coricidin (generic is fine).  Other cold medications can increase your blood pressure.    - Get plenty of rest and drink plenty of fluids. - Try to breathe moist air. Use a cold mist humidifier. - Consume warm fluids (soup or tea) to provide relief for a stuffy nose and to loosen phlegm. - For nasal stuffiness, try saline nasal spray or a Neti Pot.  Afrin nasal spray can also be used but this product should not be used longer than 3 days or  it will cause rebound nasal stuffiness (worsening nasal congestion). - For sore throat pain relief: use chloraseptic spray, suck on throat lozenges, hard candy or popsicles; gargle with warm salt water (1/4 tsp. salt per 8 oz. of water); and eat soft, bland foods. - Eat a well-balanced diet. If you cannot, ensure you are getting enough nutrients by taking a daily multivitamin. - Avoid dairy products, as they can thicken phlegm. - Avoid alcohol, as it impairs your  body's immune system.  CONTACT YOUR DOCTOR IF YOU EXPERIENCE ANY OF THE FOLLOWING: - High fever - Ear pain - Sinus-type headache - Unusually severe cold symptoms - Cough that gets worse while other cold symptoms improve - Flare up of any chronic lung problem, such as asthma - Your symptoms persist longer than 2 weeks  Follow Up Instructions: Return if symptoms worsen or fail to improve.    I discussed the assessment and treatment plan with the patient. The patient was provided an opportunity to ask questions and all were answered. The patient agreed with the plan and demonstrated an understanding of the instructions.   The patient was advised to call back or seek an in-person evaluation if the symptoms worsen or if the condition fails to improve as anticipated.  The above assessment and management plan was discussed with the patient. The patient verbalized understanding of and has agreed to the management plan. Patient is aware to call the clinic if they develop any new symptoms or if symptoms persist or worsen. Patient is aware when to return to the clinic for a follow-up visit. Patient educated on when it is appropriate to go to the emergency department.    I provided 12 minutes of time during this video encounter.   Arrie Aran Santa Lighter, DNP [facility] [address] [place], [place] [address] [phone] 01/28/2023

## 2023-01-28 NOTE — Patient Instructions (Signed)
It appears that you have a viral upper respiratory infection (cold).  Cold symptoms can last up to 2 weeks.  I recommend that you only use cold medications that are safe in high blood pressure like Coricidin (generic is fine).  Other cold medications can increase your blood pressure.    - Get plenty of rest and drink plenty of fluids. - Try to breathe moist air. Use a cold mist humidifier. - Consume warm fluids (soup or tea) to provide relief for a stuffy nose and to loosen phlegm. - For nasal stuffiness, try saline nasal spray or a Neti Pot.  Afrin nasal spray can also be used but this product should not be used longer than 3 days or it will cause rebound nasal stuffiness (worsening nasal congestion). - For sore throat pain relief: use chloraseptic spray, suck on throat lozenges, hard candy or popsicles; gargle with warm salt water (1/4 tsp. salt per 8 oz. of water); and eat soft, bland foods. - Eat a well-balanced diet. If you cannot, ensure you are getting enough nutrients by taking a daily multivitamin. - Avoid dairy products, as they can thicken phlegm. - Avoid alcohol, as it impairs your body's immune system.  CONTACT YOUR DOCTOR IF YOU EXPERIENCE ANY OF THE FOLLOWING: - High fever - Ear pain - Sinus-type headache - Unusually severe cold symptoms - Cough that gets worse while other cold symptoms improve - Flare up of any chronic lung problem, such as asthma - Your symptoms persist longer than 2 weeks  

## 2023-01-28 NOTE — Progress Notes (Signed)
Post Operative Evaluation    Procedure/Date of Surgery: Left hip gluteus medius repair 8/6  Interval History:   Presents today 2-1/2 months status post the above procedure.  She is continuing to improve.  She is overall having some sensitivity over the scar area but continuing to be quite well.  She did have 1 week where she was able to lay on this directly pain-free PMH/PSH/Family History/Social History/Meds/Allergies:    Past Medical History:  Diagnosis Date  . Allergy   . Anemia   . Anginal pain (HCC)    last time   . Anxiety   . Arthritis    RHEUMATOID  . Asthma   . Bipolar 1 disorder (HCC)   . Blood transfusion without reported diagnosis    x 3  . Cataracts, bilateral 07/2017  . CHF (congestive heart failure) (HCC)   . COPD (chronic obstructive pulmonary disease) (HCC)   . Coronary artery disease    reported hx of "MI";  Echo 2009 with normal LVF;  Myoview 05/2011: no ischemia  . Depression   . Diabetes mellitus without complication (HCC)   . Dyslipidemia   . Dysrhythmia    SVT  . Elevated liver enzymes 06/30/2022  . Esophageal stricture   . Fibromyalgia   . GERD (gastroesophageal reflux disease)   . H/O hiatal hernia   . Head injury, unspecified   . Headache    migraines  . Herpes simplex infection   . History of kidney stones   . History of loop recorder 08/10/2017   Managed by Dr. Sharrell Ku  . Hyperlipidemia   . Hypertension   . Insomnia   . Myocardial infarction [facility])    age 83  . Osteoporosis   . Pneumonia    hx  . Seizures (HCC)    none in the last 3-4 years as of 11/03/22 per patient  . Shortness of breath   . Sleep apnea    mild, does not require positive pressure treatment. (02/03/22)  . Spinal stenosis of lumbar region   . Spondylolisthesis   . Status post placement of implantable loop recorder   . Supraventricular tachycardia (HCC)   . Syncope and collapse    s/p ILR; no arhythmogenic cause identified   . UTI (lower urinary tract infection)    Past Surgical History:  Procedure Laterality Date  . BACK SURGERY    . BREAST EXCISIONAL BIOPSY Right    1990s  . BREAST SURGERY     lumpectomy  . CARDIAC CATHETERIZATION  10/06/2011  . CATARACT EXTRACTION W/PHACO Right 07/31/2017   Procedure: CATARACT EXTRACTION PHACO AND INTRAOCULAR LENS PLACEMENT (IOC);  Surgeon: Fabio Pierce, MD;  Location: [facility];  Service: Ophthalmology;  Laterality: Right;  CDE: 2.33  . CATARACT EXTRACTION W/PHACO Left 08/14/2017   Procedure: CATARACT EXTRACTION PHACO AND INTRAOCULAR LENS PLACEMENT (IOC);  Surgeon: Fabio Pierce, MD;  Location: [facility];  Service: Ophthalmology;  Laterality: Left;  CDE: 2.74  . CHOLECYSTECTOMY    . CYSTOSCOPY     stone  . DIAGNOSTIC LAPAROSCOPY     laparoscopic cholecystectomy  . DOPPLER ECHOCARDIOGRAPHY  2009  . ESOPHAGOGASTRODUODENOSCOPY N/A 10/31/2020   Procedure: ESOPHAGOGASTRODUODENOSCOPY (EGD);  Surgeon: Gaynelle Adu, MD;  Location: [facility];  Service: General;  Laterality: N/A;  . EYE SURGERY Bilateral 08/14/2017   cataract removal  .  GLUTEUS MINIMUS REPAIR Left 11/04/2022   Procedure: LEFT GLUTEUS MEDIUS REPAIR;  Surgeon: Huel Cote, MD;  Location: [facility] OR;  Service: Orthopedics;  Laterality: Left;  . head up tilt table testing  06/15/2007   [name]  . HEMORRHOID SURGERY    . HERNIA REPAIR    . insertion of implatable loop recorder  08/11/2007   [name]  . POSTERIOR CERVICAL FUSION/FORAMINOTOMY N/A 12/19/2013   Procedure: RIGHT C3-4.C4-5 AND C5-6 FORAMINOTOMIES;  Surgeon: Kerrin Champagne, MD;  Location: [facility] OR;  Service: Orthopedics;  Laterality: N/A;  . TOTAL HIP ARTHROPLASTY Left 01/31/2020   Procedure: LEFT TOTAL HIP ARTHROPLASTY ANTERIOR APPROACH;  Surgeon: Kathryne Hitch, MD;  Location: [facility] OR;  Service: Orthopedics;  Laterality: Left;  . TOTAL SHOULDER ARTHROPLASTY Right 11/15/2019   Procedure: RIGHT TOTAL SHOULDER ARTHROPLASTY;  Surgeon: Cammy Copa, MD;  Location: [facility];  Service: Orthopedics;  Laterality: Right;  . TOTAL SHOULDER REVISION Right 06/11/2021   Procedure: RIGHT SHOULDER CONVERSION TOTAL SHOULDER ARTHROPLASTY to REVERSE TOTAL SHOULDER ARTHROPLASTY;  Surgeon: Cammy Copa, MD;  Location: [facility] OR;  Service: Orthopedics;  Laterality: Right;  . TUBAL LIGATION    . UPPER GASTROINTESTINAL ENDOSCOPY    . XI ROBOTIC ASSISTED HIATAL HERNIA REPAIR N/A 10/31/2020   Procedure: XI ROBOTIC ASSISTED HIATAL HERNIA REPAIR WITH PARTIAL FUNDOPLICATION;  Surgeon: Gaynelle Adu, MD;  Location: [facility];  Service: General;  Laterality: N/A;   Social History   Socioeconomic History  . Marital status: Single    Spouse name: Not on file  . Number of children: 5  . Years of education: Not on file  . Highest education level: 5th grade  Occupational History  . Occupation: Disability    Comment: 15 years  Tobacco Use  . Smoking status: Never  . Smokeless tobacco: Never  Vaping Use  . Vaping status: Never Used  Substance and Sexual Activity  . Alcohol use: No    Alcohol/week: 0.0 standard drinks of alcohol  . Drug use: No  . Sexual activity: Yes    Birth control/protection: Post-menopausal  Other Topics Concern  . Not on file  Social History Narrative   Patient lives alone - 2 granddaughters live nearby - children are very involved   Patient is on disability.    Patient has 5th grade education.    Patient has 5 children, 24 grand-chilren, and 5 great-grand children.    Right handed    Social Determinants of Health   Financial Resource Strain: Low Risk  (10/08/2022)   Overall Financial Resource Strain (CARDIA)   . Difficulty of Paying Living Expenses: Not hard at all  Food Insecurity: No Food Insecurity (10/08/2022)   Hunger Vital Sign   . Worried About Programme researcher, broadcasting/film/video in the Last Year: Never true   . Ran Out of Food in the Last Year: Never true  Transportation Needs: No Transportation Needs (10/08/2022)   PRAPARE -  Transportation   . Lack of Transportation (Medical): No   . Lack of Transportation (Non-Medical): No  Physical Activity: Insufficiently Active (10/08/2022)   Exercise Vital Sign   . Days of Exercise per Week: 3 days   . Minutes of Exercise per Session: 30 min  Stress: No Stress Concern Present (10/08/2022)   [employer] of Occupational Health - Occupational Stress Questionnaire   . Feeling of Stress : Not at all  Social Connections: Moderately Isolated (10/08/2022)   Social Connection and Isolation Panel [NHANES]   . Frequency of  Communication with Friends and Family: More than three times a week   . Frequency of Social Gatherings with Friends and Family: More than three times a week   . Attends Religious Services: More than 4 times per year   . Active Member of Clubs or Organizations: No   . Attends Banker Meetings: Never   . Marital Status: Divorced   Family History  Problem Relation Age of Onset  . Heart attack Father   . Mental illness Father   . Mental illness Mother   . Heart attack Brother        stents  . Alcohol abuse Brother   . Heart disease Brother   . Drug abuse Brother   . Diabetes Brother   . Colon cancer Maternal Aunt   . Cirrhosis Brother   . Stomach cancer Neg Hx   . Esophageal cancer Neg Hx   . Rectal cancer Neg Hx    Allergies  Allergen Reactions  . Codeine Other (See Comments)    "I will have a heart attack."  . Morphine And Codeine Other (See Comments)    "It will cause me to have a heart attack."  . Ambien [Zolpidem Tartrate] Nausea And Vomiting  . Clonidine Derivatives Nausea And Vomiting    gerd - caused acid reflux per pt  . Metformin And Related Nausea And Vomiting    cramping from metformin  . Lyrica [Pregabalin] Swelling and Other (See Comments)    Weight gain  . Neurontin [Gabapentin] Other (See Comments)    Causes elevated LFTs    Current Outpatient Medications  Medication Sig Dispense Refill  . Accu-Chek Softclix  Lancets lancets TEST BLOOD SUGAR UP TO 4 TIMES A DAY AS DIRECTED. R73.03 400 each 3  . acyclovir ointment (ZOVIRAX) 5 % apply 1 application topically every 3 (three) hours as needed (outbreaks). 30 g 1  . albuterol (VENTOLIN HFA) 108 (90 Base) MCG/ACT inhaler INHALE 2 PUFFS EVERY 6 HOURS AS NEEDED FOR SHORTNESS OF BREATH AND WHEEZING. 6.7 g 2  . Alcohol Swabs (B-D SINGLE USE SWABS REGULAR) PADS USE 1 PAD DAILY WHEN CHECKING BLOOD SUGAR. R73.03 100 each 3  . aspirin EC 325 MG tablet Take 1 tablet (325 mg total) by mouth daily. 30 tablet 0  . blood glucose meter kit and supplies Dispense based on patient and insurance preference. Use up to four times daily as directed. (FOR ICD-10 E10.9, E11.9). 1 each 0  . busPIRone (BUSPAR) 10 MG tablet Take 1 tablet (10 mg total) by mouth 2 (two) times daily as needed. 60 tablet 5  . butalbital-acetaminophen-caffeine (FIORICET) 50-325-40 MG tablet take 1 tablet by mouth every 6 (six) hours as needed for headache. 20 tablet 0  . carvedilol (COREG) 3.125 MG tablet TAKE 1/2 (0.5) TABLET TWICE A DAY WITH A MEAL; BLOOD PRESSURE OVER 150 BEFORE TAKING. 90 tablet 1  . celecoxib (CELEBREX) 200 MG capsule Take 1 capsule (200 mg total) by mouth daily. 30 capsule 1  . dexlansoprazole (DEXILANT) 60 MG capsule Take 1 capsule (60 mg total) by mouth daily. 90 capsule 1  . diclofenac Sodium (VOLTAREN) 1 % GEL APPLY 4 GRAMS TOPICALLY 4 TIMES A DAY. (Patient taking differently: Apply 4 g topically 4 (four) times daily as needed (pain).) 100 g 0  . DULoxetine (CYMBALTA) 60 MG capsule Take 1 capsule (60 mg total) by mouth at bedtime. 90 capsule 1  . empagliflozin (JARDIANCE) 25 MG TABS tablet Take 1 tablet (25 mg total) by  mouth daily. 90 tablet 1  . escitalopram (LEXAPRO) 20 MG tablet Take 1 tablet (20 mg total) by mouth daily. 90 tablet 1  . estradiol (ESTRACE) 0.1 MG/GM vaginal cream Place 1 Applicatorful vaginally 3 (three) times a week. 42.5 g 3  . FIBER PO Take 1 tablet by mouth  in the morning and at bedtime.    . fludrocortisone (FLORINEF) 0.1 MG tablet take 1 tablet ( total) by mouth every other day. 15 tablet 0  . fluticasone (FLONASE) 50 MCG/ACT nasal spray USE 1 SPRAY IN EACH NOSTRIL ONCE DAILY. (Patient taking differently: Place 1 spray into both nostrils daily as needed for allergies.) 16 g 5  . fluticasone furoate-vilanterol (BREO ELLIPTA) 100-25 MCG/ACT AEPB Inhale 1 puff into the lungs daily. (Patient taking differently: Inhale 1 puff into the lungs daily as needed (respiratory issues.).) 1 each 11  . furosemide (LASIX) 40 MG tablet TAKE 1 TABLET DAILY AS NEEDED FOR EXCESSIVE FLUID. 30 tablet 1  . Glucagon, rDNA, (GLUCAGON EMERGENCY) 1 MG KIT Inject 1 mg into the skin as needed. 1 kit 5  . glucose blood (ACCU-CHEK GUIDE) test strip TEST BLOOD SUGAR UP TO 4 TIMES A DAY AS DIRECTED. R73.03 400 each 3  . hydrALAZINE (APRESOLINE) 25 MG tablet TAKE 1 TABLET BY MOUTH 3 TIMES DAILY AS NEEDED (FOR SEVERE HYPERTENSION/SYSTOLIC NUMBER 170 OR GREATER). 90 tablet 5  . [START ON 02/20/2023] HYDROcodone-acetaminophen (NORCO) 7.5-325 MG tablet Take 1 tablet by mouth in the morning, at noon, and at bedtime. 90 tablet 0  . HYDROcodone-acetaminophen (NORCO) 7.5-325 MG tablet Take 1 tablet by mouth in the morning, at noon, and at bedtime. 90 tablet 0  . ipratropium (ATROVENT) 0.02 % nebulizer solution USE 1 VIAL ( ) IN NEBULIZER EVERY 6 HOURS AS NEEDED FOR WHEEZING OR SHORTNESS OF BREATH. 150 mL 2  . isosorbide mononitrate (IMDUR) 60 MG 24 hr tablet Take 1 tablet (60 mg total) by mouth daily. (Patient taking differently: Take 90 mg by mouth in the morning.) 90 tablet 1  . lamoTRIgine (LAMICTAL) 150 MG tablet Take 1 tablet (150 mg total) by mouth daily. 30 tablet 1  . levETIRAcetam (KEPPRA) 500 MG tablet Take 1 tablet (500 mg total) by mouth 2 (two) times daily. 180 tablet 1  . levocetirizine (XYZAL) 5 MG tablet TAKE 1 TABLET BY MOUTH EVERY MORNING. (Patient taking differently:  Take 5 mg by mouth daily as needed for allergies.) 30 tablet 2  . lidocaine (LIDODERM) 5 % Place 1 patch onto the skin daily. Remove & Discard patch within 12 hours or as directed by MD (Patient taking differently: Place 1 patch onto the skin daily as needed (pain.). Remove & Discard patch within 12 hours or as directed by MD) 30 patch 3  . lidocaine (XYLOCAINE) 5 % ointment APPLY TO AFFECTED AREA 3 TIMES A DAY AS NEEDED FOR MILD OR MODERATE PAIN. 50 g 1  . methocarbamol (ROBAXIN) 500 MG tablet Take 1 tablet (500 mg total) by mouth 4 (four) times daily. 30 tablet 4  . nitroGLYCERIN (NITROSTAT) 0.4 MG SL tablet place one (1) tablet under tongue every 5 minutes up to (3) doses as needed for chest pain. 25 tablet 0  . ondansetron (ZOFRAN) 4 MG tablet take 1 tablet by mouth every 8 hours as needed for nausea and vomiting. 20 tablet 0  . oxybutynin (DITROPAN-XL) 5 MG 24 hr tablet Take 1 tablet (5 mg total) by mouth at bedtime. 90 tablet 1  . Probiotic Product (PROBIOTIC PO)  Take 1 capsule by mouth in the morning.    . rosuvastatin (CRESTOR) 10 MG tablet Take 1 tablet (10 mg total) by mouth daily. 90 tablet 1  . traZODone (DESYREL) 150 MG tablet Take 1 tablet (150 mg total) by mouth at bedtime. 90 tablet 1  . valACYclovir (VALTREX) 1000 MG tablet Take 1 tablet (1,000 mg total) by mouth 3 (three) times daily as needed (with active outbreaks.). 21 tablet 0  . valACYclovir (VALTREX) 500 MG tablet TAKE (1) TABLET TWICE DAILY. 60 tablet 1   No current facility-administered medications for this visit.   No results found.  Review of Systems:   A ROS was performed including pertinent positives and negatives as documented in the HPI.   Musculoskeletal Exam:    There were no vitals taken for this visit.  Left hip incision is well-appearing without erythema or drainage.  Able to flex the hip to 90 degrees.  30 degrees internal/external rotation of the hip.  Improved hip abduction strength distal neurosensory  exam is intact  Imaging:      I personally reviewed and interpreted the radiographs.   Assessment:   2-1/2 months status post left hip gluteus medius repair overall doing well.  She will continue to strengthen per protocol.  I did recommend an ultrasound-guided injection over the bursal area in order to get her some relief and allow her to lay directly on the side.  I will plan to see her back in 2 months for reassessment  Plan :    -Return to clinic 8 weeks for reassessment    Procedure Note  Patient: [name]             Date of Birth: 03/03/1956           MRN: [id number]             Visit Date: 01/28/2023  Procedures: Visit Diagnoses: No diagnosis found.  Large Joint Inj: L greater trochanter on 01/28/2023 12:15 PM Indications: pain Details: 22 G 3.5 in needle, ultrasound-guided anterolateral approach  Arthrogram: No  Medications: 4 mL lidocaine 1 %; 80 mg triamcinolone acetonide 40 MG/ML Outcome: tolerated well, no immediate complications Procedure, treatment alternatives, risks and benefits explained, specific risks discussed. Consent was given by the patient. Immediately prior to procedure a time out was called to verify the correct patient, procedure, equipment, support staff and site/side marked as required. Patient was prepped and draped in the usual sterile fashion.         I personally saw and evaluated the patient, and participated in the management and treatment plan.  Huel Cote, MD Attending Physician, Orthopedic Surgery  This document was dictated using Dragon voice recognition software. A reasonable attempt at proof reading has been made to minimize errors.

## 2023-01-29 LAB — COVID-19, FLU A+B AND RSV
Influenza A, NAA: NOT DETECTED
Influenza B, NAA: NOT DETECTED
RSV, NAA: NOT DETECTED
SARS-CoV-2, NAA: NOT DETECTED

## 2023-01-30 ENCOUNTER — Other Ambulatory Visit: Payer: Self-pay | Admitting: Nurse Practitioner

## 2023-01-30 DIAGNOSIS — R0789 Other chest pain: Secondary | ICD-10-CM

## 2023-02-03 ENCOUNTER — Encounter: Payer: Self-pay | Admitting: Family Medicine

## 2023-02-03 ENCOUNTER — Ambulatory Visit (INDEPENDENT_AMBULATORY_CARE_PROVIDER_SITE_OTHER): Payer: 59

## 2023-02-03 ENCOUNTER — Ambulatory Visit (INDEPENDENT_AMBULATORY_CARE_PROVIDER_SITE_OTHER): Payer: 59 | Admitting: Family Medicine

## 2023-02-03 VITALS — BP 109/69 | HR 102 | Temp 98.9°F | Ht 62.0 in | Wt 120.0 lb

## 2023-02-03 DIAGNOSIS — J441 Chronic obstructive pulmonary disease with (acute) exacerbation: Secondary | ICD-10-CM

## 2023-02-03 DIAGNOSIS — R0902 Hypoxemia: Secondary | ICD-10-CM | POA: Diagnosis not present

## 2023-02-03 DIAGNOSIS — R0689 Other abnormalities of breathing: Secondary | ICD-10-CM

## 2023-02-03 DIAGNOSIS — R051 Acute cough: Secondary | ICD-10-CM

## 2023-02-03 MED ORDER — IPRATROPIUM BROMIDE 0.02 % IN SOLN
0.5000 mg | Freq: Once | RESPIRATORY_TRACT | Status: AC
  Administered 2023-02-03: 0.5 mg via RESPIRATORY_TRACT

## 2023-02-03 MED ORDER — CEFDINIR 300 MG PO CAPS: 300.0000 mg | ORAL_CAPSULE | Freq: Two times a day (BID) | ORAL | 0 refills | Status: AC

## 2023-02-03 MED ORDER — FLUTICASONE FUROATE-VILANTEROL 100-25 MCG/ACT IN AEPB
1.0000 | INHALATION_SPRAY | Freq: Every day | RESPIRATORY_TRACT | 0 refills | Status: DC
Start: 2023-02-03 — End: 2023-03-05

## 2023-02-03 MED ORDER — PREDNISONE 20 MG PO TABS: 40.0000 mg | ORAL_TABLET | Freq: Every day | ORAL | 0 refills | Status: AC

## 2023-02-03 MED ORDER — CORICIDIN HBP COLD/COUGH/FLU 20-400-650 MG/30ML PO LIQD: 30.0000 mL | ORAL | 0 refills | Status: DC | PRN

## 2023-02-03 NOTE — Patient Instructions (Signed)
It appears that you have a viral upper respiratory infection (cold).  Cold symptoms can last up to 2 weeks.  I recommend that you only use cold medications that are safe in high blood pressure like Coricidin (generic is fine).  Other cold medications can increase your blood pressure.    - Get plenty of rest and drink plenty of fluids. - Try to breathe moist air. Use a cold mist humidifier. - Consume warm fluids (soup or tea) to provide relief for a stuffy nose and to loosen phlegm. - For nasal stuffiness, try saline nasal spray or a Neti Pot.  Afrin nasal spray can also be used but this product should not be used longer than 3 days or it will cause rebound nasal stuffiness (worsening nasal congestion). - For sore throat pain relief: use chloraseptic spray, suck on throat lozenges, hard candy or popsicles; gargle with warm salt water (1/4 tsp. salt per 8 oz. of water); and eat soft, bland foods. - Eat a well-balanced diet. If you cannot, ensure you are getting enough nutrients by taking a daily multivitamin. - Avoid dairy products, as they can thicken phlegm. - Avoid alcohol, as it impairs your body's immune system.  CONTACT YOUR DOCTOR IF YOU EXPERIENCE ANY OF THE FOLLOWING: - High fever - Ear pain - Sinus-type headache - Unusually severe cold symptoms - Cough that gets worse while other cold symptoms improve - Flare up of any chronic lung problem, such as asthma - Your symptoms persist longer than 2 weeks  

## 2023-02-03 NOTE — Progress Notes (Signed)
No acute infection noted on CXR. Chronic changes noted. Recommend patient continue Breo, prednisone burst, and antibiotic as prescribed. Patient to follow up in 2 weeks

## 2023-02-03 NOTE — Progress Notes (Signed)
Subjective:  Patient ID: [name], female    DOB: 05-05-1955, 67 y.o.   MRN: [id number]  Patient Care Team: Bennie Pierini, FNP as PCP - General (Nurse Practitioner) Antoine Poche, MD as PCP - Cardiology (Cardiology) Kerrin Champagne, MD (Inactive) as Consulting Physician (Orthopedic Surgery) Marinus Maw, MD as Consulting Physician (Cardiology) Kathryne Hitch, MD as Consulting Physician (Orthopedic Surgery)   Chief Complaint:  Cough (Chronic/X 2 weeks/Video visit w/ sandra 01/28/2023/Started as wet cough, no feels like a dry cough)  HPI: [name] is a 67 y.o. female presenting on 02/03/2023 for Cough (Chronic/X 2 weeks/Video visit w/ sandra 01/28/2023/Started as wet cough, no feels like a dry cough) States that it started out as light yellow green and now is dry cough. States that she was previously running a fever the first 3-4 days when it started. Has not since. Continues to have headache. She states that she has a history of COPD and asthma. States that she is not established with pulmonology. Reports she is out of breo and nebulizer. She reports that she is using albuterol inhaler every 4 hours for the last 3 days. Reports that mornings and nights are the worst for her. Reports that cough medication did not improve her symptoms. States that tessalon perles do not work for her. She is out of cough syrup.   Relevant past medical, surgical, family, and social history reviewed and updated as indicated.  Allergies and medications reviewed and updated. Data reviewed: Chart in Epic. Past Medical History:  Diagnosis Date   Allergy    Anemia    Anginal pain (HCC)    last time    Anxiety    Arthritis    RHEUMATOID   Asthma    Bipolar 1 disorder (HCC)    Blood transfusion without reported diagnosis    x 3   Cataracts, bilateral 07/2017   CHF (congestive heart failure) (HCC)    COPD (chronic obstructive pulmonary disease) (HCC)    Coronary  artery disease    reported hx of "MI";  Echo 2009 with normal LVF;  Myoview 05/2011: no ischemia   Depression    Diabetes mellitus without complication (HCC)    Dyslipidemia    Dysrhythmia    SVT   Elevated liver enzymes 06/30/2022   Esophageal stricture    Fibromyalgia    GERD (gastroesophageal reflux disease)    H/O hiatal hernia    Head injury, unspecified    Headache    migraines   Herpes simplex infection    History of kidney stones    History of loop recorder 08/10/2017   Managed by Dr. Sharrell Ku   Hyperlipidemia    Hypertension    Insomnia    Myocardial infarction [facility])    age 69   Osteoporosis    Pneumonia    hx   Seizures (HCC)    none in the last 3-4 years as of 11/03/22 per patient   Shortness of breath    Sleep apnea    mild, does not require positive pressure treatment. (02/03/22)   Spinal stenosis of lumbar region    Spondylolisthesis    Status post placement of implantable loop recorder    Supraventricular tachycardia (HCC)    Syncope and collapse    s/p ILR; no arhythmogenic cause identified   UTI (lower urinary tract infection)     Past Surgical History:  Procedure Laterality Date   BACK SURGERY  BREAST EXCISIONAL BIOPSY Right    1990s   BREAST SURGERY     lumpectomy   CARDIAC CATHETERIZATION  10/06/2011   CATARACT EXTRACTION W/PHACO Right 07/31/2017   Procedure: CATARACT EXTRACTION PHACO AND INTRAOCULAR LENS PLACEMENT (IOC);  Surgeon: Fabio Pierce, MD;  Location: [facility];  Service: Ophthalmology;  Laterality: Right;  CDE: 2.33   CATARACT EXTRACTION W/PHACO Left 08/14/2017   Procedure: CATARACT EXTRACTION PHACO AND INTRAOCULAR LENS PLACEMENT (IOC);  Surgeon: Fabio Pierce, MD;  Location: [facility];  Service: Ophthalmology;  Laterality: Left;  CDE: 2.74   CHOLECYSTECTOMY     CYSTOSCOPY     stone   DIAGNOSTIC LAPAROSCOPY     laparoscopic cholecystectomy   DOPPLER ECHOCARDIOGRAPHY  2009   ESOPHAGOGASTRODUODENOSCOPY N/A 10/31/2020   Procedure:  ESOPHAGOGASTRODUODENOSCOPY (EGD);  Surgeon: Gaynelle Adu, MD;  Location: [facility];  Service: General;  Laterality: N/A;   EYE SURGERY Bilateral 08/14/2017   cataract removal   GLUTEUS MINIMUS REPAIR Left 11/04/2022   Procedure: LEFT GLUTEUS MEDIUS REPAIR;  Surgeon: Huel Cote, MD;  Location: [facility];  Service: Orthopedics;  Laterality: Left;   head up tilt table testing  06/15/2007   [name]   HEMORRHOID SURGERY     HERNIA REPAIR     insertion of implatable loop recorder  08/11/2007   [name]   POSTERIOR CERVICAL FUSION/FORAMINOTOMY N/A 12/19/2013   Procedure: RIGHT C3-4.C4-5 AND C5-6 FORAMINOTOMIES;  Surgeon: Kerrin Champagne, MD;  Location[facility] OR;  Service: Orthopedics;  Laterality: N/A;   TOTAL HIP ARTHROPLASTY Left 01/31/2020   Procedure: LEFT TOTAL HIP ARTHROPLASTY ANTERIOR APPROACH;  Surgeon: Kathryne Hitch, MD;  Location: [facility] OR;  Service: Orthopedics;  Laterality: Left;   TOTAL SHOULDER ARTHROPLASTY Right 11/15/2019   Procedure: RIGHT TOTAL SHOULDER ARTHROPLASTY;  Surgeon: Cammy Copa, MD;  Location: [facility];  Service: Orthopedics;  Laterality: Right;   TOTAL SHOULDER REVISION Right 06/11/2021   Procedure: RIGHT SHOULDER CONVERSION TOTAL SHOULDER ARTHROPLASTY to REVERSE TOTAL SHOULDER ARTHROPLASTY;  Surgeon: Cammy Copa, MD;  Location: [facility] OR;  Service: Orthopedics;  Laterality: Right;   TUBAL LIGATION     UPPER GASTROINTESTINAL ENDOSCOPY     XI ROBOTIC ASSISTED HIATAL HERNIA REPAIR N/A 10/31/2020   Procedure: XI ROBOTIC ASSISTED HIATAL HERNIA REPAIR WITH PARTIAL FUNDOPLICATION;  Surgeon: Gaynelle Adu, MD;  Location: [facility];  Service: General;  Laterality: N/A;    Social History   Socioeconomic History   Marital status: Single    Spouse name: Not on file   Number of children: 5   Years of education: Not on file   Highest education level: 5th grade  Occupational History   Occupation: Disability    Comment: 15 years  Tobacco Use   Smoking status:  Never   Smokeless tobacco: Never  Vaping Use   Vaping status: Never Used  Substance and Sexual Activity   Alcohol use: No    Alcohol/week: 0.0 standard drinks of alcohol   Drug use: No   Sexual activity: Yes    Birth control/protection: Post-menopausal  Other Topics Concern   Not on file  Social History Narrative   Patient lives alone - 2 granddaughters live nearby - children are very involved   Patient is on disability.    Patient has 5th grade education.    Patient has 5 children, 24 grand-chilren, and 5 great-grand children.    Right handed    Social Determinants of Health   Financial Resource Strain: Low Risk  (10/08/2022)   Overall  Financial Resource Strain (CARDIA)    Difficulty of Paying Living Expenses: Not hard at all  Food Insecurity: No Food Insecurity (10/08/2022)   Hunger Vital Sign    Worried About Running Out of Food in the Last Year: Never true    Ran Out of Food in the Last Year: Never true  Transportation Needs: No Transportation Needs (10/08/2022)   PRAPARE - Administrator, Civil Service (Medical): No    Lack of Transportation (Non-Medical): No  Physical Activity: Insufficiently Active (10/08/2022)   Exercise Vital Sign    Days of Exercise per Week: 3 days    Minutes of Exercise per Session: 30 min  Stress: No Stress Concern Present (10/08/2022)   [employer] of Occupational Health - Occupational Stress Questionnaire    Feeling of Stress : Not at all  Social Connections: Moderately Isolated (10/08/2022)   Social Connection and Isolation Panel [NHANES]    Frequency of Communication with Friends and Family: More than three times a week    Frequency of Social Gatherings with Friends and Family: More than three times a week    Attends Religious Services: More than 4 times per year    Active Member of [employer] or Organizations: No    Attends Banker Meetings: Never    Marital Status: Divorced  Catering manager Violence: Not At Risk  (10/08/2022)   Humiliation, Afraid, Rape, and Kick questionnaire    Fear of Current or Ex-Partner: No    Emotionally Abused: No    Physically Abused: No    Sexually Abused: No    Outpatient Encounter Medications as of 02/03/2023  Medication Sig   Accu-Chek Softclix Lancets lancets TEST BLOOD SUGAR UP TO 4 TIMES A DAY AS DIRECTED. R73.03   acyclovir ointment (ZOVIRAX) 5 % apply 1 application topically every 3 (three) hours as needed (outbreaks).   albuterol (VENTOLIN HFA) 108 (90 Base) MCG/ACT inhaler INHALE 2 PUFFS EVERY 6 HOURS AS NEEDED FOR SHORTNESS OF BREATH AND WHEEZING.   Alcohol Swabs (B-D SINGLE USE SWABS REGULAR) PADS USE 1 PAD DAILY WHEN CHECKING BLOOD SUGAR. R73.03   aspirin EC 325 MG tablet Take 1 tablet (325 mg total) by mouth daily.   blood glucose meter kit and supplies Dispense based on patient and insurance preference. Use up to four times daily as directed. (FOR ICD-10 E10.9, E11.9).   busPIRone (BUSPAR) 10 MG tablet Take 1 tablet (10 mg total) by mouth 2 (two) times daily as needed.   butalbital-acetaminophen-caffeine (FIORICET) 50-325-40 MG tablet take 1 tablet by mouth every 6 (six) hours as needed for headache.   carvedilol (COREG) 3.125 MG tablet TAKE 1/2 (0.5) TABLET TWICE A DAY WITH A MEAL; BLOOD PRESSURE OVER 150 BEFORE TAKING.   celecoxib (CELEBREX) 200 MG capsule Take 1 capsule (200 mg total) by mouth daily.   dexlansoprazole (DEXILANT) 60 MG capsule Take 1 capsule (60 mg total) by mouth daily.   diclofenac Sodium (VOLTAREN) 1 % GEL APPLY 4 GRAMS TOPICALLY 4 TIMES A DAY. (Patient taking differently: Apply 4 g topically 4 (four) times daily as needed (pain).)   DULoxetine (CYMBALTA) 60 MG capsule Take 1 capsule (60 mg total) by mouth at bedtime.   empagliflozin (JARDIANCE) 25 MG TABS tablet Take 1 tablet (25 mg total) by mouth daily.   escitalopram (LEXAPRO) 20 MG tablet Take 1 tablet (20 mg total) by mouth daily.   estradiol (ESTRACE) 0.1 MG/GM vaginal cream Place 1  Applicatorful vaginally 3 (three) times a week.  FIBER PO Take 1 tablet by mouth in the morning and at bedtime.   fludrocortisone (FLORINEF) 0.1 MG tablet take 1 tablet ( total) by mouth every other day.   fluticasone (FLONASE) 50 MCG/ACT nasal spray USE 1 SPRAY IN EACH NOSTRIL ONCE DAILY. (Patient taking differently: Place 1 spray into both nostrils daily as needed for allergies.)   fluticasone furoate-vilanterol (BREO ELLIPTA) 100-25 MCG/ACT AEPB INHALE 1 PUFF INTO THE LUNGS ONCE A DAY.   furosemide (LASIX) 40 MG tablet TAKE 1 TABLET DAILY AS NEEDED FOR EXCESSIVE FLUID.   Glucagon, rDNA, (GLUCAGON EMERGENCY) 1 MG KIT Inject 1 mg into the skin as needed.   glucose blood (ACCU-CHEK GUIDE) test strip TEST BLOOD SUGAR UP TO 4 TIMES A DAY AS DIRECTED. R73.03   hydrALAZINE (APRESOLINE) 25 MG tablet TAKE 1 TABLET BY MOUTH 3 TIMES DAILY AS NEEDED (FOR SEVERE HYPERTENSION/SYSTOLIC NUMBER 170 OR GREATER).   [START ON 02/20/2023] HYDROcodone-acetaminophen (NORCO) 7.5-325 MG tablet Take 1 tablet by mouth in the morning, at noon, and at bedtime.   HYDROcodone-acetaminophen (NORCO) 7.5-325 MG tablet Take 1 tablet by mouth in the morning, at noon, and at bedtime.   ipratropium (ATROVENT) 0.02 % nebulizer solution USE 1 VIAL ( ) IN NEBULIZER EVERY 6 HOURS AS NEEDED FOR WHEEZING OR SHORTNESS OF BREATH.   isosorbide mononitrate (IMDUR) 60 MG 24 hr tablet Take 1 tablet (60 mg total) by mouth daily. (Patient taking differently: Take 90 mg by mouth in the morning.)   lamoTRIgine (LAMICTAL) 150 MG tablet Take 1 tablet (150 mg total) by mouth daily.   levETIRAcetam (KEPPRA) 500 MG tablet Take 1 tablet (500 mg total) by mouth 2 (two) times daily.   levocetirizine (XYZAL) 5 MG tablet TAKE 1 TABLET BY MOUTH EVERY MORNING.   lidocaine (LIDODERM) 5 % Place 1 patch onto the skin daily. Remove & Discard patch within 12 hours or as directed by MD (Patient taking differently: Place 1 patch onto the skin daily as needed  (pain.). Remove & Discard patch within 12 hours or as directed by MD)   lidocaine (XYLOCAINE) 5 % ointment APPLY TO AFFECTED AREA 3 TIMES A DAY AS NEEDED FOR MILD OR MODERATE PAIN.   methocarbamol (ROBAXIN) 500 MG tablet Take 1 tablet (500 mg total) by mouth 4 (four) times daily.   nitroGLYCERIN (NITROSTAT) 0.4 MG SL tablet place one (1) tablet under tongue every 5 minutes up to (3) doses as needed for chest pain.   ondansetron (ZOFRAN) 4 MG tablet take 1 tablet by mouth every 8 hours as needed for nausea and vomiting.   oxybutynin (DITROPAN-XL) 5 MG 24 hr tablet Take 1 tablet (5 mg total) by mouth at bedtime.   Probiotic Product (PROBIOTIC PO) Take 1 capsule by mouth in the morning.   rosuvastatin (CRESTOR) 10 MG tablet Take 1 tablet (10 mg total) by mouth daily.   traZODone (DESYREL) 150 MG tablet Take 1 tablet (150 mg total) by mouth at bedtime.   valACYclovir (VALTREX) 1000 MG tablet Take 1 tablet (1,000 mg total) by mouth 3 (three) times daily as needed (with active outbreaks.).   valACYclovir (VALTREX) 500 MG tablet TAKE (1) TABLET TWICE DAILY.   No facility-administered encounter medications on file as of 02/03/2023.    Allergies  Allergen Reactions   Codeine Other (See Comments)    "I will have a heart attack."   Morphine And Codeine Other (See Comments)    "It will cause me to have a heart attack."   Ambien [Zolpidem  Tartrate] Nausea And Vomiting   Clonidine Derivatives Nausea And Vomiting    gerd - caused acid reflux per pt   Metformin And Related Nausea And Vomiting    cramping from metformin   Lyrica [Pregabalin] Swelling and Other (See Comments)    Weight gain   Neurontin [Gabapentin] Other (See Comments)    Causes elevated LFTs     Review of Systems As per HPI  Objective:  BP 109/69   Pulse (!) 102   Temp 98.9 F (37.2 C)   Ht 5\' 2"  (1.575 m)   Wt 120 lb (54.4 kg)   SpO2 90% Comment: dipped down to 88%  BMI 21.95 kg/m    Wt Readings from Last 3 Encounters:   12/22/22 125 lb (56.7 kg)  12/03/22 123 lb 6.4 oz (56 kg)  11/26/22 125 lb (56.7 kg)   Physical Exam Constitutional:      General: She is awake. She is not in acute distress.    Appearance: Normal appearance. She is well-developed and well-groomed. She is ill-appearing. She is not toxic-appearing or diaphoretic.  HENT:     Right Ear: No decreased hearing noted. No laceration, drainage, swelling or tenderness. No middle ear effusion. There is no impacted cerumen. No foreign body. No mastoid tenderness. No PE tube. No hemotympanum. Tympanic membrane is not injected, scarred, perforated, erythematous, retracted or bulging.     Left Ear: No decreased hearing noted. No laceration, drainage, swelling or tenderness.  No middle ear effusion. There is no impacted cerumen. No foreign body. No mastoid tenderness. No PE tube. No hemotympanum. Tympanic membrane is not injected, scarred, perforated, erythematous, retracted or bulging.     Mouth/Throat:     Lips: Pink. No lesions.     Mouth: No oral lesions.     Pharynx: Postnasal drip present.  Cardiovascular:     Rate and Rhythm: Tachycardia present.     Pulses: Normal pulses.          Radial pulses are 2+ on the right side and 2+ on the left side.       Posterior tibial pulses are 2+ on the right side and 2+ on the left side.     Heart sounds: Normal heart sounds. No murmur heard.    No gallop.  Pulmonary:     Effort: Pulmonary effort is normal. No respiratory distress.     Breath sounds: Decreased air movement present. No stridor. Examination of the left-middle field reveals decreased breath sounds. Examination of the left-lower field reveals decreased breath sounds. Decreased breath sounds present. No wheezing, rhonchi or rales.  Musculoskeletal:     Cervical back: Full passive range of motion without pain and neck supple.     Right lower leg: No edema.     Left lower leg: No edema.  Skin:    General: Skin is warm.     Capillary Refill:  Capillary refill takes less than 2 seconds.  Neurological:     General: No focal deficit present.     Mental Status: She is alert, oriented to person, place, and time and easily aroused. Mental status is at baseline.     GCS: GCS eye subscore is 4. GCS verbal subscore is 5. GCS motor subscore is 6.     Motor: No weakness.  Psychiatric:        Attention and Perception: Attention and perception normal.        Mood and Affect: Mood and affect normal.  Speech: Speech normal.        Behavior: Behavior normal. Behavior is cooperative.        Thought Content: Thought content normal. Thought content does not include homicidal or suicidal ideation. Thought content does not include homicidal or suicidal plan.        Cognition and Memory: Cognition and memory normal.        Judgment: Judgment normal.     Results for orders placed or performed in visit on 01/28/23  COVID-19, Flu A+B and RSV   Specimen: Nasopharyngeal(NP) swabs in vial transport medium  Result Value Ref Range   SARS-CoV-2, NAA Not Detected Not Detected   Influenza A, NAA Not Detected Not Detected   Influenza B, NAA Not Detected Not Detected   RSV, NAA Not Detected Not Detected   Test Information: Comment    *Note: Due to a large number of results and/or encounters for the requested time period, some results have not been displayed. A complete set of results can be found in Results Review.       12/22/2022    3:03 PM 12/03/2022   10:12 AM 11/26/2022    8:54 AM 10/08/2022    2:40 PM 09/23/2022    2:55 PM  Depression screen PHQ 2/9  Decreased Interest 0 0 0 0 0  Down, Depressed, Hopeless 0 0 0 0 0  PHQ - 2 Score 0 0 0 0 0  Altered sleeping 0 0 0 0 2  Tired, decreased energy 2 2 0 0 2  Change in appetite 0  0 0 0  Feeling bad or failure about yourself  0 0 0 0 0  Trouble concentrating 2 2 2  0 2  Moving slowly or fidgety/restless 0 0 0 0 0  Suicidal thoughts 0 0 0 0 0  PHQ-9 Score 4 4 2  0 6  Difficult doing work/chores  Not difficult at all Not difficult at all Not difficult at all Not difficult at all Not difficult at all       12/22/2022    3:03 PM 12/03/2022   10:12 AM 11/26/2022    8:54 AM 09/23/2022    2:56 PM  GAD 7 : Generalized Anxiety Score  Nervous, Anxious, on Edge 0 0 0 0  Control/stop worrying 0 0 0 0  Worry too much - different things 0 0 0 0  Trouble relaxing 0 0 0 0  Restless 0 0 0 0  Easily annoyed or irritable 0 0 0 0  Afraid - awful might happen 0 0 0 0  Total GAD 7 Score 0 0 0 0  Anxiety Difficulty Not difficult at all Not difficult at all Not difficult at all Not difficult at all   Pertinent labs & imaging results that were available during my care of the patient were reviewed by me and considered in my medical decision making.  Assessment & Plan:   Jazlyne "[name]" was seen today for cough.  Diagnoses and all orders for this visit:  Chronic obstructive pulmonary disease with acute exacerbation (HCC) Imaging as below. Will communicate results to patient once available. Will await results to determine next steps.  Will start medication as below for suspected COPD with exacerbation. Refill provided for Breo.  Provided nebulization in office. Improved O2 saturation.  -     DG Chest 2 View; Future -     predniSONE (DELTASONE) 20 MG tablet; Take 2 tablets (40 mg total) by mouth daily with breakfast for 5 days. -  fluticasone furoate-vilanterol (BREO ELLIPTA) 100-25 MCG/ACT AEPB; Inhale 1 puff into the lungs daily. -     cefdinir (OMNICEF) 300 MG capsule; Take 1 capsule (300 mg total) by mouth 2 (two) times daily for 7 days. 1 po BID - ipratropium (ATROVENT) nebulizer solution 0.5 mg  Acute cough Imaging as below. Will communicate results to patient once available. Will await results to determine next steps.  Will start medication as below for suspected COPD with exacerbation. Refill provided for Breo.  Unable to provide patient with cough syrup with codeine as patient has  CSA signed with another provider in office. Cough syrup as below.  -     DG Chest 2 View; Future -     predniSONE (DELTASONE) 20 MG tablet; Take 2 tablets (40 mg total) by mouth daily with breakfast for 5 days. -     cefdinir (OMNICEF) 300 MG capsule; Take 1 capsule (300 mg total) by mouth 2 (two) times daily for 7 days. 1 po BID -     Dextromethorphan-GG-APAP (CORICIDIN HBP COLD/COUGH/FLU) 10-200-325 MG/15ML LIQD; Take 30 mLs by mouth every 4 (four) hours as needed (cough).  Decreased breath sounds at left lung base Imaging as below. Will communicate results to patient once available. Will await results to determine next steps.  Will start medication as below for suspected COPD with exacerbation. Refill provided for Breo.  Provided nebulization in office. Improved O2 saturation.  -     DG Chest 2 View; Future -     predniSONE (DELTASONE) 20 MG tablet; Take 2 tablets (40 mg total) by mouth daily with breakfast for 5 days. -     cefdinir (OMNICEF) 300 MG capsule; Take 1 capsule (300 mg total) by mouth 2 (two) times daily for 7 days. 1 po BID - ipratropium (ATROVENT) nebulizer solution 0.5 mg  Hypoxia Patient O2 saturation improved after duoneb to 94%. Reviewed note from Paulene Floor, 12/22/22. O2 saturation at that time was 93%.  - ipratropium (ATROVENT) nebulizer solution 0.5 mg   Continue all other maintenance medications.  Follow up plan: Return in about 2 weeks (around 02/17/2023) for COPD exacerbation follow up. .   Continue healthy lifestyle choices, including diet (rich in fruits, vegetables, and lean proteins, and low in salt and simple carbohydrates) and exercise (at least 30 minutes of moderate physical activity daily).  Written and verbal instructions provided   The above assessment and management plan was discussed with the patient. The patient verbalized understanding of and has agreed to the management plan. Patient is aware to call the clinic if they develop any new symptoms  or if symptoms persist or worsen. Patient is aware when to return to the clinic for a follow-up visit. Patient educated on when it is appropriate to go to the emergency department.   Neale Burly, DNP-FNP [facility] [address] [place], [place] [address] [phone]

## 2023-02-05 ENCOUNTER — Other Ambulatory Visit: Payer: Self-pay

## 2023-02-05 ENCOUNTER — Encounter: Payer: Self-pay | Admitting: Sports Medicine

## 2023-02-05 ENCOUNTER — Ambulatory Visit: Payer: 59 | Admitting: Sports Medicine

## 2023-02-05 DIAGNOSIS — M1812 Unilateral primary osteoarthritis of first carpometacarpal joint, left hand: Secondary | ICD-10-CM

## 2023-02-05 DIAGNOSIS — M1811 Unilateral primary osteoarthritis of first carpometacarpal joint, right hand: Secondary | ICD-10-CM | POA: Diagnosis not present

## 2023-02-05 DIAGNOSIS — M25551 Pain in right hip: Secondary | ICD-10-CM

## 2023-02-05 DIAGNOSIS — M67952 Unspecified disorder of synovium and tendon, left thigh: Secondary | ICD-10-CM | POA: Diagnosis not present

## 2023-02-05 MED ORDER — LIDOCAINE HCL 1 % IJ SOLN
2.0000 mL | INTRAMUSCULAR | Status: AC | PRN
  Administered 2023-02-05: 2 mL

## 2023-02-05 MED ORDER — BUPIVACAINE HCL 0.25 % IJ SOLN
2.0000 mL | INTRAMUSCULAR | Status: AC | PRN
  Administered 2023-02-05: 2 mL via INTRA_ARTICULAR

## 2023-02-05 MED ORDER — BETAMETHASONE SOD PHOS & ACET 6 (3-3) MG/ML IJ SUSP
6.0000 mg | INTRAMUSCULAR | Status: AC | PRN
  Administered 2023-02-05: 6 mg via INTRA_ARTICULAR

## 2023-02-05 NOTE — Progress Notes (Signed)
[name] - 67 y.o. female MRN [id number]  Date of birth: [birth year]  Office Visit Note: Visit Date: 02/05/2023 PCP: Bennie Pierini, FNP Referred by: Daphine Deutscher, [name], *  Subjective: Chief Complaint  Patient presents with   Right Hip - Pain    Injection   HPI: [name] is a pleasant 67 y.o. female who presents today for follow-up of right lateral hip pain and b/l thumb CMC OA.  CMC joint arthritis -we did proceed with CMC joint injections bilaterally a few weeks ago, these are currently doing better. The right thumb is great, the left thumb is still having some pain.  She continues her Celebrex 200 mg once daily.   Right hip -history of greater trochanteric pain syndrome of the right hip.  This has been doing well for months but here her pain has become more bothersome.  She does do home exercises she will do for both hips.  She is status post left hip gluteus medius repair with Dr. Steward Drone on 11/04/2022.  Close chronic hydrocodone-acetaminophen 7.5-325 mg 3 times a day for pain control.  Pertinent ROS were reviewed with the patient and found to be negative unless otherwise specified above in HPI.   Assessment & Plan: Visit Diagnoses:  1. Greater trochanteric pain syndrome of right lower extremity   2. Primary osteoarthritis of first carpometacarpal joint of right hand   3. Primary osteoarthritis of first carpometacarpal joint of left hand   4. Tendinopathy of left gluteus medius    Plan: Impression is exacerbation of greater trochanteric pain syndrome of the right lower extremity.  Bedside ultrasound does not show any notable effusion.  Most likely tendinopathy of that side.  She has received good results in the past with ultrasound-guided greater trochanteric injection, and we did repeat this today.  Patient tolerated well.  She will continue her home hip stabilization exercises a few times a week.  For her thumbs - she has CMC OA, previous injections  resolved her right thumb pain but her left is still somewhat bothersome.  She will remain in her [facility] cool comfort brace for support.  She will take her Celebrex 200 mg once daily with food.  Discussed with her if injection and bracing is not giving her significant relief, next step would be having her see our hand surgeon, Dr. Fara Boros for possible surgical management, but we are not quite ready for this.  She will continue her chronic hydrocodone-acetaminophen 7.5-325 mg 3 times a day for pain control as needed.  She will follow-up with me as needed.  Follow-up: Return if symptoms worsen or fail to improve.   Meds & Orders: No orders of the defined types were placed in this encounter.   Orders Placed This Encounter  Procedures   US Guided Needle Placement - No Linked Charges     Procedures: Large Joint Inj: R greater trochanter on 02/05/2023 10:45 AM Indications: pain Details: 22 G 3.5 in needle, ultrasound-guided lateral approach Medications: 2 mL lidocaine 1 %; 2 mL bupivacaine 0.25 %; 6 mg betamethasone acetate-betamethasone sodium phosphate 6 (3-3) MG/ML Outcome: tolerated well, no immediate complications  US-Guided Greater Trochanteric Bursa Injection, Right After discussion on risks/benefits/indications and informed verbal consent was obtained, a timeout was performed. The patient was lying in lateral recumbent position on exam table. Using ultrasound guidance, the greater trochanter was identified. The area overlying the trochanteric bursa was then prepped with Betadine and alcohol swabs. Following sterile precautions, ultrasound was reapplied to visualize needle guidance  with a 22-gauge 3.5" needle utilizing an in-plane approach to inject the bursa with 2:2:1 lidocaine:bupivicaine:betamethasone. Delivery of the injectate was visualized into the region of hypoechoic fluid of the greater trochanteric bursa. Patient tolerated procedure well without immediate complications.    Procedure,  treatment alternatives, risks and benefits explained, specific risks discussed. Consent was given by the patient. Immediately prior to procedure a time out was called to verify the correct patient, procedure, equipment, support staff and site/side marked as required. Patient was prepped and draped in the usual sterile fashion.          Clinical History: No specialty comments available.  She reports that she has never smoked. She has never used smokeless tobacco.  Recent Labs    09/23/22 1516  HGBA1C 5.7*    Objective:    Physical Exam  Gen: Well-appearing, in no acute distress; non-toxic CV:  Well-perfused. Warm.  Resp: Breathing unlabored on room air; no wheezing. Psych: Fluid speech in conversation; appropriate affect; normal thought process Neuro: Sensation intact throughout. No gross coordination deficits.   Ortho Exam - Right hip: + TTP over the greater trochanter and more so within the mid aspect of the gluteal musculature. No redness swelling or effusion here.  - Bilateral thumbs: There is some bony bossing of both CMC joints on the left is greater than right.  No effusion noted.  Imaging: No results found.  Past Medical/Family/Surgical/Social History: Medications & Allergies reviewed per EMR, new medications updated. 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   Hypokalemia 11/25/2020   S/P laparoscopic fundoplication 10/31/2020   Status post total replacement of left hip 01/31/2020   Unilateral primary osteoarthritis, left hip 12/21/2019   S/P shoulder replacement, right 11/15/2019   Overweight (BMI 25.0-29.9) 04/15/2019   Diverticulosis 04/14/2019   Chronic diastolic CHF (congestive heart failure)  (HCC) 04/14/2019   Unstable angina (HCC) 10/20/2017   Chronic pain 09/23/2016   Pre-diabetes 09/02/2016   Recurrent major depressive disorder, in partial remission (HCC) 07/14/2016   Seizures (HCC) 07/14/2016   GAD (generalized anxiety disorder) 07/14/2016   Insomnia 09/05/2014   Allergic rhinitis 09/05/2014   Cervical spondylosis without myelopathy 12/19/2013    Class: Chronic   Neural foraminal stenosis of cervical spine 12/19/2013   Cervical radiculitis 09/19/2013   Lumbosacral spondylosis without myelopathy 11/16/2012   Postlaminectomy syndrome, lumbar region 11/16/2012   Herpes simplex virus (HSV) infection 10/31/2008   Hyperlipidemia with target LDL less than 100 10/31/2008   Essential hypertension 10/31/2008   GERD 10/31/2008   SPINAL STENOSIS OF LUMBAR REGION 10/31/2008   Myalgia 10/31/2008   Osteoporosis 10/31/2008   SPONDYLOLISTHESIS 10/31/2008   SYNCOPE 10/31/2008   Sleep apnea 10/31/2008   Past Medical History:  Diagnosis Date   Allergy    Anemia    Anginal pain (HCC)    last time    Anxiety    Arthritis    RHEUMATOID   Asthma    Bipolar 1 disorder (HCC)    Blood transfusion without reported diagnosis    x 3   Cataracts, bilateral 07/2017   CHF (congestive heart failure) (HCC)    COPD (chronic obstructive pulmonary disease) (HCC)    Coronary artery disease    reported hx of "MI";  Echo 2009 with normal LVF;  Myoview 05/2011: no ischemia   Depression    Diabetes mellitus without complication (HCC)    Dyslipidemia    Dysrhythmia    SVT   Elevated liver enzymes 06/30/2022   Esophageal stricture    Fibromyalgia    GERD (gastroesophageal reflux disease)    H/O hiatal hernia    Head injury, unspecified    Headache    migraines   Herpes simplex infection    History of kidney stones    History of loop recorder 08/10/2017   Managed by Dr. Sharrell Ku   Hyperlipidemia    Hypertension    Insomnia    Myocardial infarction [facility])    age 49   Osteoporosis     Pneumonia    hx   Seizures (HCC)    none in the last 3-4 years as of 11/03/22 per patient   Shortness of breath    Sleep apnea    mild, does not require positive pressure treatment. (02/03/22)   Spinal stenosis of lumbar region    Spondylolisthesis    Status post placement of implantable loop recorder    Supraventricular tachycardia (HCC)    Syncope and collapse    s/p ILR; no arhythmogenic cause identified   UTI (lower urinary tract infection)    Family History  Problem Relation Age of Onset   Heart attack Father    Mental illness Father    Mental illness Mother    Heart attack Brother        stents   Alcohol abuse Brother    Heart disease Brother    Drug abuse Brother    Diabetes Brother    Colon cancer Maternal Aunt    Cirrhosis Brother    Stomach cancer Neg Hx    Esophageal cancer Neg Hx    Rectal cancer Neg Hx    Past Surgical History:  Procedure Laterality Date   BACK SURGERY     BREAST EXCISIONAL BIOPSY Right    1990s   BREAST SURGERY     lumpectomy   CARDIAC CATHETERIZATION  10/06/2011   CATARACT EXTRACTION W/PHACO Right 07/31/2017   Procedure: CATARACT EXTRACTION PHACO AND INTRAOCULAR LENS PLACEMENT (IOC);  Surgeon: Fabio Pierce, MD;  Location: [facility];  Service: Ophthalmology;  Laterality: Right;  CDE: 2.33   CATARACT EXTRACTION W/PHACO Left 08/14/2017   Procedure: CATARACT EXTRACTION PHACO AND INTRAOCULAR LENS PLACEMENT (IOC);  Surgeon: Fabio Pierce, MD;  Location: [facility];  Service: Ophthalmology;  Laterality: Left;  CDE: 2.74   CHOLECYSTECTOMY     CYSTOSCOPY     stone   DIAGNOSTIC LAPAROSCOPY     laparoscopic cholecystectomy   DOPPLER ECHOCARDIOGRAPHY  2009   ESOPHAGOGASTRODUODENOSCOPY N/A 10/31/2020   Procedure: ESOPHAGOGASTRODUODENOSCOPY (EGD);  Surgeon: Gaynelle Adu, MD;  Location: [facility];  Service: General;  Laterality: N/A;   EYE SURGERY Bilateral 08/14/2017   cataract removal   GLUTEUS MINIMUS REPAIR Left 11/04/2022   Procedure: LEFT GLUTEUS  MEDIUS REPAIR;  Surgeon: Huel Cote, MD;  Location: [facility];  Service: Orthopedics;  Laterality: Left;   head up tilt table testing  06/15/2007   [name]   HEMORRHOID SURGERY     HERNIA REPAIR     insertion of implatable loop recorder  08/11/2007   [name]   POSTERIOR CERVICAL FUSION/FORAMINOTOMY N/A 12/19/2013   Procedure: RIGHT C3-4.C4-5 AND C5-6 FORAMINOTOMIES;  Surgeon: Kerrin Champagne, MD;  Location: [facility] OR;  Service: Orthopedics;  Laterality: N/A;   TOTAL HIP  ARTHROPLASTY Left 01/31/2020   Procedure: LEFT TOTAL HIP ARTHROPLASTY ANTERIOR APPROACH;  Surgeon: Kathryne Hitch, MD;  Location: [facility] OR;  Service: Orthopedics;  Laterality: Left;   TOTAL SHOULDER ARTHROPLASTY Right 11/15/2019   Procedure: RIGHT TOTAL SHOULDER ARTHROPLASTY;  Surgeon: Cammy Copa, MD;  Location: [facility];  Service: Orthopedics;  Laterality: Right;   TOTAL SHOULDER REVISION Right 06/11/2021   Procedure: RIGHT SHOULDER CONVERSION TOTAL SHOULDER ARTHROPLASTY to REVERSE TOTAL SHOULDER ARTHROPLASTY;  Surgeon: Cammy Copa, MD;  Location: [facility] OR;  Service: Orthopedics;  Laterality: Right;   TUBAL LIGATION     UPPER GASTROINTESTINAL ENDOSCOPY     XI ROBOTIC ASSISTED HIATAL HERNIA REPAIR N/A 10/31/2020   Procedure: XI ROBOTIC ASSISTED HIATAL HERNIA REPAIR WITH PARTIAL FUNDOPLICATION;  Surgeon: Gaynelle Adu, MD;  Location: [facility];  Service: General;  Laterality: N/A;   Social History   Occupational History   Occupation: Disability    Comment: 15 years  Tobacco Use   Smoking status: Never   Smokeless tobacco: Never  Vaping Use   Vaping status: Never Used  Substance and Sexual Activity   Alcohol use: No    Alcohol/week: 0.0 standard drinks of alcohol   Drug use: No   Sexual activity: Yes    Birth control/protection: Post-menopausal

## 2023-02-17 ENCOUNTER — Ambulatory Visit (INDEPENDENT_AMBULATORY_CARE_PROVIDER_SITE_OTHER): Payer: 59 | Admitting: Family Medicine

## 2023-02-17 ENCOUNTER — Encounter: Payer: Self-pay | Admitting: Family Medicine

## 2023-02-17 VITALS — BP 109/71 | HR 72 | Temp 98.7°F | Ht 62.0 in | Wt 120.0 lb

## 2023-02-17 DIAGNOSIS — R051 Acute cough: Secondary | ICD-10-CM

## 2023-02-17 NOTE — Progress Notes (Signed)
Subjective:  Patient ID: [name], female    DOB: 1955-11-19, 67 y.o.   MRN: [id number]  Patient Care Team: Bennie Pierini, FNP as PCP - General (Nurse Practitioner) Antoine Poche, MD as PCP - Cardiology (Cardiology) Kerrin Champagne, MD (Inactive) as Consulting Physician (Orthopedic Surgery) Marinus Maw, MD as Consulting Physician (Cardiology) Kathryne Hitch, MD as Consulting Physician (Orthopedic Surgery)   Chief Complaint:  COPD (2 week follow up)  HPI: [name] is a 67 y.o. female presenting on 02/17/2023 for COPD (2 week follow up) Patient states that she is much improved. Reports that her cough is much better. She was able to get out and go to church this past weekend. She has completed antibiotic and prednisone burst. Continues to use inhaler daily if she has a coughing spell. Continues to use breo. Does not have a pulmonologist.   Relevant past medical, surgical, family, and social history reviewed and updated as indicated.  Allergies and medications reviewed and updated. Data reviewed: Chart in Epic.   Past Medical History:  Diagnosis Date   Allergy    Anemia    Anginal pain (HCC)    last time    Anxiety    Arthritis    RHEUMATOID   Asthma    Bipolar 1 disorder (HCC)    Blood transfusion without reported diagnosis    x 3   Cataracts, bilateral 07/2017   CHF (congestive heart failure) (HCC)    COPD (chronic obstructive pulmonary disease) (HCC)    Coronary artery disease    reported hx of "MI";  Echo 2009 with normal LVF;  Myoview 05/2011: no ischemia   Depression    Diabetes mellitus without complication (HCC)    Dyslipidemia    Dysrhythmia    SVT   Elevated liver enzymes 06/30/2022   Esophageal stricture    Fibromyalgia    GERD (gastroesophageal reflux disease)    H/O hiatal hernia    Head injury, unspecified    Headache    migraines   Herpes simplex infection    History of kidney stones    History of loop  recorder 08/10/2017   Managed by Dr. Sharrell Ku   Hyperlipidemia    Hypertension    Insomnia    Myocardial infarction [facility])    age 12   Osteoporosis    Pneumonia    hx   Seizures (HCC)    none in the last 3-4 years as of 11/03/22 per patient   Shortness of breath    Sleep apnea    mild, does not require positive pressure treatment. (02/03/22)   Spinal stenosis of lumbar region    Spondylolisthesis    Status post placement of implantable loop recorder    Supraventricular tachycardia (HCC)    Syncope and collapse    s/p ILR; no arhythmogenic cause identified   UTI (lower urinary tract infection)     Past Surgical History:  Procedure Laterality Date   BACK SURGERY     BREAST EXCISIONAL BIOPSY Right    1990s   BREAST SURGERY     lumpectomy   CARDIAC CATHETERIZATION  10/06/2011   CATARACT EXTRACTION W/PHACO Right 07/31/2017   Procedure: CATARACT EXTRACTION PHACO AND INTRAOCULAR LENS PLACEMENT (IOC);  Surgeon: Fabio Pierce, MD;  Location: [facility];  Service: Ophthalmology;  Laterality: Right;  CDE: 2.33   CATARACT EXTRACTION W/PHACO Left 08/14/2017   Procedure: CATARACT EXTRACTION PHACO AND INTRAOCULAR LENS PLACEMENT (IOC);  Surgeon: Fabio Pierce,  MD;  Location: [facility];  Service: Ophthalmology;  Laterality: Left;  CDE: 2.74   CHOLECYSTECTOMY     CYSTOSCOPY     stone   DIAGNOSTIC LAPAROSCOPY     laparoscopic cholecystectomy   DOPPLER ECHOCARDIOGRAPHY  2009   ESOPHAGOGASTRODUODENOSCOPY N/A 10/31/2020   Procedure: ESOPHAGOGASTRODUODENOSCOPY (EGD);  Surgeon: Gaynelle Adu, MD;  Location: [facility];  Service: General;  Laterality: N/A;   EYE SURGERY Bilateral 08/14/2017   cataract removal   GLUTEUS MINIMUS REPAIR Left 11/04/2022   Procedure: LEFT GLUTEUS MEDIUS REPAIR;  Surgeon: Huel Cote, MD;  Location: [facility];  Service: Orthopedics;  Laterality: Left;   head up tilt table testing  06/15/2007   [name]   HEMORRHOID SURGERY     HERNIA REPAIR     insertion of implatable  loop recorder  08/11/2007   [name]   POSTERIOR CERVICAL FUSION/FORAMINOTOMY N/A 12/19/2013   Procedure: RIGHT C3-4.C4-5 AND C5-6 FORAMINOTOMIES;  Surgeon: Kerrin Champagne, MD;  Location: [facility] OR;  Service: Orthopedics;  Laterality: N/A;   TOTAL HIP ARTHROPLASTY Left 01/31/2020   Procedure: LEFT TOTAL HIP ARTHROPLASTY ANTERIOR APPROACH;  Surgeon: Kathryne Hitch, MD;  Location: [facility] OR;  Service: Orthopedics;  Laterality: Left;   TOTAL SHOULDER ARTHROPLASTY Right 11/15/2019   Procedure: RIGHT TOTAL SHOULDER ARTHROPLASTY;  Surgeon: Cammy Copa, MD;  Location: [facility];  Service: Orthopedics;  Laterality: Right;   TOTAL SHOULDER REVISION Right 06/11/2021   Procedure: RIGHT SHOULDER CONVERSION TOTAL SHOULDER ARTHROPLASTY to REVERSE TOTAL SHOULDER ARTHROPLASTY;  Surgeon: Cammy Copa, MD;  Location: [facility] OR;  Service: Orthopedics;  Laterality: Right;   TUBAL LIGATION     UPPER GASTROINTESTINAL ENDOSCOPY     XI ROBOTIC ASSISTED HIATAL HERNIA REPAIR N/A 10/31/2020   Procedure: XI ROBOTIC ASSISTED HIATAL HERNIA REPAIR WITH PARTIAL FUNDOPLICATION;  Surgeon: Gaynelle Adu, MD;  Location: [facility];  Service: General;  Laterality: N/A;    Social History   Socioeconomic History   Marital status: Single    Spouse name: Not on file   Number of children: 5   Years of education: Not on file   Highest education level: 5th grade  Occupational History   Occupation: Disability    Comment: 15 years  Tobacco Use   Smoking status: Never   Smokeless tobacco: Never  Vaping Use   Vaping status: Never Used  Substance and Sexual Activity   Alcohol use: No    Alcohol/week: 0.0 standard drinks of alcohol   Drug use: No   Sexual activity: Yes    Birth control/protection: Post-menopausal  Other Topics Concern   Not on file  Social History Narrative   Patient lives alone - 2 granddaughters live nearby - children are very involved   Patient is on disability.    Patient has 5th grade  education.    Patient has 5 children, 24 grand-chilren, and 5 great-grand children.    Right handed    Social Determinants of Health   Financial Resource Strain: Low Risk  (10/08/2022)   Overall Financial Resource Strain (CARDIA)    Difficulty of Paying Living Expenses: Not hard at all  Food Insecurity: No Food Insecurity (10/08/2022)   Hunger Vital Sign    Worried About Running Out of Food in the Last Year: Never true    Ran Out of Food in the Last Year: Never true  Transportation Needs: No Transportation Needs (10/08/2022)   PRAPARE - Transportation    Lack of Transportation (Medical): No    Lack of  Transportation (Non-Medical): No  Physical Activity: Insufficiently Active (10/08/2022)   Exercise Vital Sign    Days of Exercise per Week: 3 days    Minutes of Exercise per Session: 30 min  Stress: No Stress Concern Present (10/08/2022)   [employer] of Occupational Health - Occupational Stress Questionnaire    Feeling of Stress : Not at all  Social Connections: Moderately Isolated (10/08/2022)   Social Connection and Isolation Panel [NHANES]    Frequency of Communication with Friends and Family: More than three times a week    Frequency of Social Gatherings with Friends and Family: More than three times a week    Attends Religious Services: More than 4 times per year    Active Member of [employer] or Organizations: No    Attends Banker Meetings: Never    Marital Status: Divorced  Catering manager Violence: Not At Risk (10/08/2022)   Humiliation, Afraid, Rape, and Kick questionnaire    Fear of Current or Ex-Partner: No    Emotionally Abused: No    Physically Abused: No    Sexually Abused: No    Outpatient Encounter Medications as of 02/17/2023  Medication Sig   Accu-Chek Softclix Lancets lancets TEST BLOOD SUGAR UP TO 4 TIMES A DAY AS DIRECTED. R73.03   acyclovir ointment (ZOVIRAX) 5 % apply 1 application topically every 3 (three) hours as needed (outbreaks).    albuterol (VENTOLIN HFA) 108 (90 Base) MCG/ACT inhaler INHALE 2 PUFFS EVERY 6 HOURS AS NEEDED FOR SHORTNESS OF BREATH AND WHEEZING.   Alcohol Swabs (B-D SINGLE USE SWABS REGULAR) PADS USE 1 PAD DAILY WHEN CHECKING BLOOD SUGAR. R73.03   aspirin EC 325 MG tablet Take 1 tablet (325 mg total) by mouth daily.   blood glucose meter kit and supplies Dispense based on patient and insurance preference. Use up to four times daily as directed. (FOR ICD-10 E10.9, E11.9).   busPIRone (BUSPAR) 10 MG tablet Take 1 tablet (10 mg total) by mouth 2 (two) times daily as needed.   butalbital-acetaminophen-caffeine (FIORICET) 50-325-40 MG tablet take 1 tablet by mouth every 6 (six) hours as needed for headache.   carvedilol (COREG) 3.125 MG tablet TAKE 1/2 (0.5) TABLET TWICE A DAY WITH A MEAL; BLOOD PRESSURE OVER 150 BEFORE TAKING.   celecoxib (CELEBREX) 200 MG capsule Take 1 capsule (200 mg total) by mouth daily.   dexlansoprazole (DEXILANT) 60 MG capsule Take 1 capsule (60 mg total) by mouth daily.   Dextromethorphan-GG-APAP (CORICIDIN HBP COLD/COUGH/FLU) 10-200-325 MG/15ML LIQD Take 30 mLs by mouth every 4 (four) hours as needed (cough).   diclofenac Sodium (VOLTAREN) 1 % GEL APPLY 4 GRAMS TOPICALLY 4 TIMES A DAY. (Patient taking differently: Apply 4 g topically 4 (four) times daily as needed (pain).)   DULoxetine (CYMBALTA) 60 MG capsule Take 1 capsule (60 mg total) by mouth at bedtime.   empagliflozin (JARDIANCE) 25 MG TABS tablet Take 1 tablet (25 mg total) by mouth daily.   escitalopram (LEXAPRO) 20 MG tablet Take 1 tablet (20 mg total) by mouth daily.   estradiol (ESTRACE) 0.1 MG/GM vaginal cream Place 1 Applicatorful vaginally 3 (three) times a week.   FIBER PO Take 1 tablet by mouth in the morning and at bedtime.   fludrocortisone (FLORINEF) 0.1 MG tablet take 1 tablet ( total) by mouth every other day.   fluticasone (FLONASE) 50 MCG/ACT nasal spray USE 1 SPRAY IN EACH NOSTRIL ONCE DAILY. (Patient taking  differently: Place 1 spray into both nostrils daily as needed for  allergies.)   fluticasone furoate-vilanterol (BREO ELLIPTA) 100-25 MCG/ACT AEPB Inhale 1 puff into the lungs daily.   furosemide (LASIX) 40 MG tablet TAKE 1 TABLET DAILY AS NEEDED FOR EXCESSIVE FLUID.   Glucagon, rDNA, (GLUCAGON EMERGENCY) 1 MG KIT Inject 1 mg into the skin as needed.   glucose blood (ACCU-CHEK GUIDE) test strip TEST BLOOD SUGAR UP TO 4 TIMES A DAY AS DIRECTED. R73.03   hydrALAZINE (APRESOLINE) 25 MG tablet TAKE 1 TABLET BY MOUTH 3 TIMES DAILY AS NEEDED (FOR SEVERE HYPERTENSION/SYSTOLIC NUMBER 170 OR GREATER).   [[name] ON 02/20/2023] HYDROcodone-acetaminophen (NORCO) 7.5-325 MG tablet Take 1 tablet by mouth in the morning, at noon, and at bedtime.   HYDROcodone-acetaminophen (NORCO) 7.5-325 MG tablet Take 1 tablet by mouth in the morning, at noon, and at bedtime.   ipratropium (ATROVENT) 0.02 % nebulizer solution USE 1 VIAL ( ) IN NEBULIZER EVERY 6 HOURS AS NEEDED FOR WHEEZING OR SHORTNESS OF BREATH.   isosorbide mononitrate (IMDUR) 60 MG 24 hr tablet Take 1 tablet (60 mg total) by mouth daily. (Patient taking differently: Take 90 mg by mouth in the morning.)   lamoTRIgine (LAMICTAL) 150 MG tablet Take 1 tablet (150 mg total) by mouth daily.   levETIRAcetam (KEPPRA) 500 MG tablet Take 1 tablet (500 mg total) by mouth 2 (two) times daily.   levocetirizine (XYZAL) 5 MG tablet TAKE 1 TABLET BY MOUTH EVERY MORNING.   lidocaine (LIDODERM) 5 % Place 1 patch onto the skin daily. Remove & Discard patch within 12 hours or as directed by MD (Patient taking differently: Place 1 patch onto the skin daily as needed (pain.). Remove & Discard patch within 12 hours or as directed by MD)   lidocaine (XYLOCAINE) 5 % ointment APPLY TO AFFECTED AREA 3 TIMES A DAY AS NEEDED FOR MILD OR MODERATE PAIN.   methocarbamol (ROBAXIN) 500 MG tablet Take 1 tablet (500 mg total) by mouth 4 (four) times daily.   nitroGLYCERIN (NITROSTAT) 0.4 MG SL  tablet place one (1) tablet under tongue every 5 minutes up to (3) doses as needed for chest pain.   ondansetron (ZOFRAN) 4 MG tablet take 1 tablet by mouth every 8 hours as needed for nausea and vomiting.   oxybutynin (DITROPAN-XL) 5 MG 24 hr tablet Take 1 tablet (5 mg total) by mouth at bedtime.   Probiotic Product (PROBIOTIC PO) Take 1 capsule by mouth in the morning.   rosuvastatin (CRESTOR) 10 MG tablet Take 1 tablet (10 mg total) by mouth daily.   traZODone (DESYREL) 150 MG tablet Take 1 tablet (150 mg total) by mouth at bedtime.   valACYclovir (VALTREX) 1000 MG tablet Take 1 tablet (1,000 mg total) by mouth 3 (three) times daily as needed (with active outbreaks.).   valACYclovir (VALTREX) 500 MG tablet TAKE (1) TABLET TWICE DAILY.   No facility-administered encounter medications on file as of 02/17/2023.    Allergies  Allergen Reactions   Codeine Other (See Comments)    "I will have a heart attack."   Morphine And Codeine Other (See Comments)    "It will cause me to have a heart attack."   Ambien [Zolpidem Tartrate] Nausea And Vomiting   Clonidine Derivatives Nausea And Vomiting    gerd - caused acid reflux per pt   Metformin And Related Nausea And Vomiting    cramping from metformin   Lyrica [Pregabalin] Swelling and Other (See Comments)    Weight gain   Neurontin [Gabapentin] Other (See Comments)    Causes elevated  LFTs     Review of Systems  Objective:  BP 109/71   Pulse 72   Temp 98.7 F (37.1 C)   Ht 5\' 2"  (1.575 m)   Wt 120 lb (54.4 kg)   SpO2 92%   BMI 21.95 kg/m    Wt Readings from Last 3 Encounters:  02/17/23 120 lb (54.4 kg)  02/03/23 120 lb (54.4 kg)  12/22/22 125 lb (56.7 kg)    Physical Exam Constitutional:      General: She is awake. She is not in acute distress.    Appearance: Normal appearance. She is well-developed and well-groomed. She is not ill-appearing, toxic-appearing or diaphoretic.  Cardiovascular:     Rate and Rhythm: Normal rate  and regular rhythm.     Pulses: Normal pulses.          Radial pulses are 2+ on the right side and 2+ on the left side.       Posterior tibial pulses are 2+ on the right side and 2+ on the left side.     Heart sounds: Normal heart sounds. No murmur heard.    No gallop.  Pulmonary:     Effort: Pulmonary effort is normal. No respiratory distress.     Breath sounds: Normal breath sounds. No stridor, decreased air movement or transmitted upper airway sounds. No decreased breath sounds, wheezing, rhonchi or rales.  Musculoskeletal:     Cervical back: Full passive range of motion without pain and neck supple.     Right lower leg: No edema.     Left lower leg: No edema.  Skin:    General: Skin is warm.     Capillary Refill: Capillary refill takes less than 2 seconds.     Findings: Ecchymosis present.  Neurological:     General: No focal deficit present.     Mental Status: She is alert, oriented to person, place, and time and easily aroused. Mental status is at baseline.     GCS: GCS eye subscore is 4. GCS verbal subscore is 5. GCS motor subscore is 6.     Motor: No weakness.  Psychiatric:        Attention and Perception: Attention and perception normal.        Mood and Affect: Mood and affect normal.        Speech: Speech normal.        Behavior: Behavior normal. Behavior is cooperative.        Thought Content: Thought content normal. Thought content does not include homicidal or suicidal ideation. Thought content does not include homicidal or suicidal plan.        Cognition and Memory: Cognition and memory normal.        Judgment: Judgment normal.     Results for orders placed or performed in visit on 01/28/23  COVID-19, Flu A+B and RSV   Specimen: Nasopharyngeal(NP) swabs in vial transport medium  Result Value Ref Range   SARS-CoV-2, NAA Not Detected Not Detected   Influenza A, NAA Not Detected Not Detected   Influenza B, NAA Not Detected Not Detected   RSV, NAA Not Detected Not  Detected   Test Information: Comment    *Note: Due to a large number of results and/or encounters for the requested time period, some results have not been displayed. A complete set of results can be found in Results Review.       02/17/2023    9:31 AM 02/03/2023   10:31 AM 12/22/2022  3:03 PM 12/03/2022   10:12 AM 11/26/2022    8:54 AM  Depression screen PHQ 2/9  Decreased Interest 0 0 0 0 0  Down, Depressed, Hopeless 0 0 0 0 0  PHQ - 2 Score 0 0 0 0 0  Altered sleeping 0 0 0 0 0  Tired, decreased energy 0 3 2 2  0  Change in appetite 0 0 0  0  Feeling bad or failure about yourself  0 0 0 0 0  Trouble concentrating 3 3 2 2 2   Moving slowly or fidgety/restless 0 0 0 0 0  Suicidal thoughts 0 0 0 0 0  PHQ-9 Score 3 6 4 4 2   Difficult doing work/chores Not difficult at all Not difficult at all Not difficult at all Not difficult at all Not difficult at all       02/17/2023    9:32 AM 02/03/2023   10:31 AM 12/22/2022    3:03 PM 12/03/2022   10:12 AM  GAD 7 : Generalized Anxiety Score  Nervous, Anxious, on Edge 0 0 0 0  Control/stop worrying 0 0 0 0  Worry too much - different things 0 0 0 0  Trouble relaxing 0 0 0 0  Restless 0 0 0 0  Easily annoyed or irritable 0 0 0 0  Afraid - awful might happen 0 0 0 0  Total GAD 7 Score 0 0 0 0  Anxiety Difficulty Not difficult at all Not difficult at all Not difficult at all Not difficult at all    Pertinent labs & imaging results that were available during my care of the patient were reviewed by me and considered in my medical decision making.  Assessment & Plan:  Erica "[name]" was seen today for copd.  Diagnoses and all orders for this visit:  Acute cough Significantly improved. Reviewed CXR results. Patient to continue on Breo and albuterol as needed. Declined referral to pulmonology. Patient to follow up with PCP.    Continue all other maintenance medications.  Follow up plan: Return if symptoms worsen or fail to  improve.   Continue healthy lifestyle choices, including diet (rich in fruits, vegetables, and lean proteins, and low in salt and simple carbohydrates) and exercise (at least 30 minutes of moderate physical activity daily).  Written and verbal instructions provided   The above assessment and management plan was discussed with the patient. The patient verbalized understanding of and has agreed to the management plan. Patient is aware to call the clinic if they develop any new symptoms or if symptoms persist or worsen. Patient is aware when to return to the clinic for a follow-up visit. Patient educated on when it is appropriate to go to the emergency department.   Neale Burly, DNP-FNP [facility] [address] [place], [place] [address] [phone]

## 2023-02-27 ENCOUNTER — Other Ambulatory Visit: Payer: Self-pay | Admitting: Nurse Practitioner

## 2023-02-27 ENCOUNTER — Other Ambulatory Visit: Payer: Self-pay | Admitting: Family Medicine

## 2023-02-27 DIAGNOSIS — R0789 Other chest pain: Secondary | ICD-10-CM

## 2023-02-27 DIAGNOSIS — I10 Essential (primary) hypertension: Secondary | ICD-10-CM

## 2023-02-27 DIAGNOSIS — F411 Generalized anxiety disorder: Secondary | ICD-10-CM

## 2023-02-27 DIAGNOSIS — B009 Herpesviral infection, unspecified: Secondary | ICD-10-CM

## 2023-03-05 ENCOUNTER — Other Ambulatory Visit: Payer: Self-pay | Admitting: Nurse Practitioner

## 2023-03-05 DIAGNOSIS — J441 Chronic obstructive pulmonary disease with (acute) exacerbation: Secondary | ICD-10-CM

## 2023-03-19 ENCOUNTER — Encounter: Payer: Self-pay | Admitting: Nurse Practitioner

## 2023-03-19 ENCOUNTER — Ambulatory Visit (INDEPENDENT_AMBULATORY_CARE_PROVIDER_SITE_OTHER): Payer: 59 | Admitting: Nurse Practitioner

## 2023-03-19 VITALS — BP 159/89 | HR 78 | Temp 97.9°F | Ht 62.0 in | Wt 121.0 lb

## 2023-03-19 DIAGNOSIS — M47817 Spondylosis without myelopathy or radiculopathy, lumbosacral region: Secondary | ICD-10-CM | POA: Diagnosis not present

## 2023-03-19 DIAGNOSIS — N941 Unspecified dyspareunia: Secondary | ICD-10-CM

## 2023-03-19 DIAGNOSIS — J441 Chronic obstructive pulmonary disease with (acute) exacerbation: Secondary | ICD-10-CM

## 2023-03-19 DIAGNOSIS — B009 Herpesviral infection, unspecified: Secondary | ICD-10-CM

## 2023-03-19 DIAGNOSIS — Q762 Congenital spondylolisthesis: Secondary | ICD-10-CM | POA: Diagnosis not present

## 2023-03-19 DIAGNOSIS — I10 Essential (primary) hypertension: Secondary | ICD-10-CM

## 2023-03-19 DIAGNOSIS — R0789 Other chest pain: Secondary | ICD-10-CM

## 2023-03-19 DIAGNOSIS — F411 Generalized anxiety disorder: Secondary | ICD-10-CM

## 2023-03-19 MED ORDER — VALACYCLOVIR HCL 500 MG PO TABS: ORAL_TABLET | ORAL | 1 refills | Status: DC

## 2023-03-19 MED ORDER — ESTRADIOL 0.1 MG/GM VA CREA
1.0000 | TOPICAL_CREAM | VAGINAL | 3 refills | Status: AC
Start: 2023-03-20 — End: ?

## 2023-03-19 MED ORDER — VALACYCLOVIR HCL 1 G PO TABS: 1000.0000 mg | ORAL_TABLET | Freq: Three times a day (TID) | ORAL | 0 refills | Status: DC | PRN

## 2023-03-19 MED ORDER — FUROSEMIDE 40 MG PO TABS: ORAL_TABLET | ORAL | 0 refills | Status: DC

## 2023-03-19 MED ORDER — ISOSORBIDE MONONITRATE ER 60 MG PO TB24: 60.0000 mg | ORAL_TABLET | Freq: Every day | ORAL | 1 refills | Status: DC

## 2023-03-19 MED ORDER — HYDROCODONE-ACETAMINOPHEN 7.5-325 MG PO TABS: 1.0000 | ORAL_TABLET | Freq: Three times a day (TID) | ORAL | 0 refills | Status: DC

## 2023-03-19 MED ORDER — HYDROCODONE-ACETAMINOPHEN 7.5-325 MG PO TABS: 1.0000 | ORAL_TABLET | Freq: Three times a day (TID) | ORAL | 0 refills | Status: AC

## 2023-03-19 MED ORDER — LAMOTRIGINE 150 MG PO TABS: 150.0000 mg | ORAL_TABLET | Freq: Every day | ORAL | 1 refills | Status: DC

## 2023-03-19 MED ORDER — FLUTICASONE FUROATE-VILANTEROL 100-25 MCG/ACT IN AEPB: 1.0000 | INHALATION_SPRAY | Freq: Every day | RESPIRATORY_TRACT | 5 refills | Status: DC

## 2023-03-19 NOTE — Progress Notes (Signed)
Subjective:    Patient ID: [name], female    DOB: [birth year], 67 y.o.   MRN: [id number]   Chief Complaint: Pain Management    HPI:  [name] is a 67 y.o. who identifies as a female who was assigned female at birth.   Social history: Lives with: her granddaughter lives with her Work history: disability   Comes in today for follow up of the following chronic medical issues:  1. Lumbosacral spondylosis without myelopathy 2. SPONDYLOLISTHESIS Pain assessment: Cause of pain- spondylolisthesis Pain location- mainly in lumbar spine Pain on scale of 1-10- 8-9/10 Frequency- daily What increases pain-to much activity What makes pain Better-rest helps Effects on ADL - none most days Any change in general medical condition-none  Current opioids rx- norco 7.5/325 # meds rx- 90 Effectiveness of current meds-helps Adverse reactions from pain meds-none Morphine equivalent- 22.5  Pill count performed-No Last drug screen - 03/27/22 ( high risk q26m, moderate risk q77m, low risk yearly ) Urine drug screen today- No Was the [facility] reviewed- yes  If yes were their any concerning findings? - no   Overdose risk: 1    09/12/2021    3:09 PM  Opioid Risk   Alcohol 0  Illegal Drugs 0  Rx Drugs 0  Alcohol 0  Illegal Drugs 0  Rx Drugs 0  Age between 16-45 years  0  History of Preadolescent Sexual Abuse 3  Psychological Disease 2  ADD Negative  OCD Negative  Bipolar Positive  Depression 1  Opioid Risk Tool Scoring 6  Opioid Risk Interpretation Moderate Risk     Pain contract signed on: 04/02/22    New complaints: None today  Allergies  Allergen Reactions   Codeine Other (See Comments)    "I will have a heart attack."   Morphine And Codeine Other (See Comments)    "It will cause me to have a heart attack."   Ambien [Zolpidem Tartrate] Nausea And Vomiting   Clonidine Derivatives Nausea And Vomiting    gerd - caused acid reflux per pt   Metformin And  Related Nausea And Vomiting    cramping from metformin   Lyrica [Pregabalin] Swelling and Other (See Comments)    Weight gain   Neurontin [Gabapentin] Other (See Comments)    Causes elevated LFTs    Outpatient Encounter Medications as of 03/19/2023  Medication Sig   Accu-Chek Softclix Lancets lancets test blood sugar up to 4 times a day as directed. Dx R73.03   acyclovir ointment (ZOVIRAX) 5 % apply 1 application topically every 3 (three) hours as needed (outbreaks).   albuterol (VENTOLIN HFA) 108 (90 Base) MCG/ACT inhaler INHALE 2 PUFFS EVERY 6 HOURS AS NEEDED FOR SHORTNESS OF BREATH AND WHEEZING.   Alcohol Swabs (B-D SINGLE USE SWABS REGULAR) PADS USE 1 PAD DAILY WHEN CHECKING BLOOD SUGAR. R73.03   aspirin EC 325 MG tablet Take 1 tablet (325 mg total) by mouth daily.   blood glucose meter kit and supplies Dispense based on patient and insurance preference. Use up to four times daily as directed. (FOR ICD-10 E10.9, E11.9).   busPIRone (BUSPAR) 10 MG tablet Take 1 tablet (10 mg total) by mouth 2 (two) times daily as needed.   butalbital-acetaminophen-caffeine (FIORICET) 50-325-40 MG tablet take 1 tablet by mouth every 6 (six) hours as needed for headache.   carvedilol (COREG) 3.125 MG tablet TAKE 1/2 (0.5) TABLET TWICE A DAY WITH A MEAL; BLOOD PRESSURE OVER 150 BEFORE TAKING.   celecoxib (CELEBREX)  200 MG capsule Take 1 capsule (200 mg total) by mouth daily.   dexlansoprazole (DEXILANT) 60 MG capsule Take 1 capsule (60 mg total) by mouth daily.   Dextromethorphan-GG-APAP (CORICIDIN HBP COLD/COUGH/FLU) 10-200-325 MG/15ML LIQD Take 30 mLs by mouth every 4 (four) hours as needed (cough).   diclofenac Sodium (VOLTAREN) 1 % GEL APPLY 4 GRAMS TOPICALLY 4 TIMES A DAY. (Patient taking differently: Apply 4 g topically 4 (four) times daily as needed (pain).)   DULoxetine (CYMBALTA) 60 MG capsule Take 1 capsule (60 mg total) by mouth at bedtime.   empagliflozin (JARDIANCE) 25 MG TABS tablet Take 1  tablet (25 mg total) by mouth daily.   escitalopram (LEXAPRO) 20 MG tablet Take 1 tablet (20 mg total) by mouth daily.   estradiol (ESTRACE) 0.1 MG/GM vaginal cream Place 1 Applicatorful vaginally 3 (three) times a week.   FIBER PO Take 1 tablet by mouth in the morning and at bedtime.   fludrocortisone (FLORINEF) 0.1 MG tablet take 1 tablet ( total) by mouth every other day.   fluticasone (FLONASE) 50 MCG/ACT nasal spray USE 1 SPRAY IN EACH NOSTRIL ONCE DAILY. (Patient taking differently: Place 1 spray into both nostrils daily as needed for allergies.)   fluticasone furoate-vilanterol (BREO ELLIPTA) 100-25 MCG/ACT AEPB INHALE 1 PUFF BY MOUTH INTO THE LUNGS ONCE DAILY   furosemide (LASIX) 40 MG tablet take 1 tablet daily as needed for excessive fluid.   Glucagon, rDNA, (GLUCAGON EMERGENCY) 1 MG KIT Inject 1 mg into the skin as needed.   glucose blood (ACCU-CHEK GUIDE) test strip TEST BLOOD SUGAR UP TO 4 TIMES A DAY AS DIRECTED. R73.03   hydrALAZINE (APRESOLINE) 25 MG tablet TAKE 1 TABLET BY MOUTH 3 TIMES DAILY AS NEEDED (FOR SEVERE HYPERTENSION/SYSTOLIC NUMBER 170 OR GREATER).   HYDROcodone-acetaminophen (NORCO) 7.5-325 MG tablet Take 1 tablet by mouth in the morning, at noon, and at bedtime.   ipratropium (ATROVENT) 0.02 % nebulizer solution USE 1 VIAL ( ) IN NEBULIZER EVERY 6 HOURS AS NEEDED FOR WHEEZING OR SHORTNESS OF BREATH.   isosorbide mononitrate (IMDUR) 30 MG 24 hr tablet take 1 AND 1/2 tablets (45 MILLIGRAM total) by mouth daily.   isosorbide mononitrate (IMDUR) 60 MG 24 hr tablet Take 1 tablet (60 mg total) by mouth daily. (Patient taking differently: Take 90 mg by mouth in the morning.)   lamoTRIgine (LAMICTAL) 150 MG tablet take 1 tablet (150 MILLIGRAM total) by mouth daily.   levETIRAcetam (KEPPRA) 500 MG tablet Take 1 tablet (500 mg total) by mouth 2 (two) times daily.   levocetirizine (XYZAL) 5 MG tablet TAKE 1 TABLET BY MOUTH EVERY MORNING.   lidocaine (LIDODERM) 5 % place 1  patch onto the skin daily. remove & discard patch within 12 hours or as directed by md   lidocaine (XYLOCAINE) 5 % ointment apply to affected area 3 times a day as needed for mild or moderate pain.   methocarbamol (ROBAXIN) 500 MG tablet Take 1 tablet (500 mg total) by mouth 4 (four) times daily.   nitroGLYCERIN (NITROSTAT) 0.4 MG SL tablet place one (1) tablet under tongue every 5 minutes up to (3) doses as needed for chest pain.   ondansetron (ZOFRAN) 4 MG tablet take 1 tablet by mouth every 8 hours as needed for nausea and vomiting.   oxybutynin (DITROPAN-XL) 5 MG 24 hr tablet Take 1 tablet (5 mg total) by mouth at bedtime.   Probiotic Product (PROBIOTIC PO) Take 1 capsule by mouth in the morning.   rosuvastatin (  CRESTOR) 10 MG tablet Take 1 tablet (10 mg total) by mouth daily.   traZODone (DESYREL) 150 MG tablet Take 1 tablet (150 mg total) by mouth at bedtime.   valACYclovir (VALTREX) 1000 MG tablet Take 1 tablet (1,000 mg total) by mouth 3 (three) times daily as needed (with active outbreaks.).   valACYclovir (VALTREX) 500 MG tablet take (1) tablet twice daily.   No facility-administered encounter medications on file as of 03/19/2023.    Past Surgical History:  Procedure Laterality Date   BACK SURGERY     BREAST EXCISIONAL BIOPSY Right    1990s   BREAST SURGERY     lumpectomy   CARDIAC CATHETERIZATION  10/06/2011   CATARACT EXTRACTION W/PHACO Right 07/31/2017   Procedure: CATARACT EXTRACTION PHACO AND INTRAOCULAR LENS PLACEMENT (IOC);  Surgeon: Fabio Pierce, MD;  Location: [facility];  Service: Ophthalmology;  Laterality: Right;  CDE: 2.33   CATARACT EXTRACTION W/PHACO Left 08/14/2017   Procedure: CATARACT EXTRACTION PHACO AND INTRAOCULAR LENS PLACEMENT (IOC);  Surgeon: Fabio Pierce, MD;  Location: [facility];  Service: Ophthalmology;  Laterality: Left;  CDE: 2.74   CHOLECYSTECTOMY     CYSTOSCOPY     stone   DIAGNOSTIC LAPAROSCOPY     laparoscopic cholecystectomy   DOPPLER  ECHOCARDIOGRAPHY  2009   ESOPHAGOGASTRODUODENOSCOPY N/A 10/31/2020   Procedure: ESOPHAGOGASTRODUODENOSCOPY (EGD);  Surgeon: Gaynelle Adu, MD;  Location: [facility];  Service: General;  Laterality: N/A;   EYE SURGERY Bilateral 08/14/2017   cataract removal   GLUTEUS MINIMUS REPAIR Left 11/04/2022   Procedure: LEFT GLUTEUS MEDIUS REPAIR;  Surgeon: Huel Cote, MD;  Location: [facility];  Service: Orthopedics;  Laterality: Left;   head up tilt table testing  06/15/2007   [name]   HEMORRHOID SURGERY     HERNIA REPAIR     insertion of implatable loop recorder  08/11/2007   [name]   POSTERIOR CERVICAL FUSION/FORAMINOTOMY N/A 12/19/2013   Procedure: RIGHT C3-4.C4-5 AND C5-6 FORAMINOTOMIES;  Surgeon: Kerrin Champagne, MD;  Location: [facility] OR;  Service: Orthopedics;  Laterality: N/A;   TOTAL HIP ARTHROPLASTY Left 01/31/2020   Procedure: LEFT TOTAL HIP ARTHROPLASTY ANTERIOR APPROACH;  Surgeon: Kathryne Hitch, MD;  Location: [facility];  Service: Orthopedics;  Laterality: Left;   TOTAL SHOULDER ARTHROPLASTY Right 11/15/2019   Procedure: RIGHT TOTAL SHOULDER ARTHROPLASTY;  Surgeon: Cammy Copa, MD;  Location: [facility];  Service: Orthopedics;  Laterality: Right;   TOTAL SHOULDER REVISION Right 06/11/2021   Procedure: RIGHT SHOULDER CONVERSION TOTAL SHOULDER ARTHROPLASTY to REVERSE TOTAL SHOULDER ARTHROPLASTY;  Surgeon: Cammy Copa, MD;  Location: [facility] OR;  Service: Orthopedics;  Laterality: Right;   TUBAL LIGATION     UPPER GASTROINTESTINAL ENDOSCOPY     XI ROBOTIC ASSISTED HIATAL HERNIA REPAIR N/A 10/31/2020   Procedure: XI ROBOTIC ASSISTED HIATAL HERNIA REPAIR WITH PARTIAL FUNDOPLICATION;  Surgeon: Gaynelle Adu, MD;  Location: [facility];  Service: General;  Laterality: N/A;    Family History  Problem Relation Age of Onset   Heart attack Father    Mental illness Father    Mental illness Mother    Heart attack Brother        stents   Alcohol abuse Brother    Heart disease Brother     Drug abuse Brother    Diabetes Brother    Colon cancer Maternal Aunt    Cirrhosis Brother    Stomach cancer Neg Hx    Esophageal cancer Neg Hx    Rectal cancer Neg Hx  Review of Systems  Constitutional:  Negative for diaphoresis.  Eyes:  Negative for pain.  Respiratory:  Negative for shortness of breath.   Cardiovascular:  Negative for chest pain, palpitations and leg swelling.  Gastrointestinal:  Negative for abdominal pain.  Endocrine: Negative for polydipsia.  Skin:  Negative for rash.  Neurological:  Negative for dizziness, weakness and headaches.  Hematological:  Does not bruise/bleed easily.  All other systems reviewed and are negative.      Objective:   Physical Exam Vitals and nursing note reviewed.  Constitutional:      General: She is not in acute distress.    Appearance: Normal appearance. She is well-developed.  Neck:     Vascular: No carotid bruit or JVD.  Cardiovascular:     Rate and Rhythm: Normal rate and regular rhythm.     Heart sounds: Normal heart sounds.  Pulmonary:     Effort: Pulmonary effort is normal. No respiratory distress.     Breath sounds: Normal breath sounds. No wheezing or rales.  Chest:     Chest wall: No tenderness.  Abdominal:     General: Bowel sounds are normal. There is no distension or abdominal bruit.     Palpations: Abdomen is soft. There is no hepatomegaly, splenomegaly, mass or pulsatile mass.     Tenderness: There is no abdominal tenderness.  Musculoskeletal:        General: Normal range of motion.     Cervical back: Normal range of motion and neck supple.  Lymphadenopathy:     Cervical: No cervical adenopathy.  Skin:    General: Skin is warm and dry.  Neurological:     Mental Status: She is alert and oriented to person, place, and time.     Deep Tendon Reflexes: Reflexes are normal and symmetric.  Psychiatric:        Behavior: Behavior normal.        Thought Content: Thought content normal.         Judgment: Judgment normal.    BP (!) 159/89   Pulse 78   Temp 97.9 F (36.6 C) (Temporal)   Ht 5\' 2"  (1.575 m)   Wt 121 lb (54.9 kg)   SpO2 98%   BMI 22.13 kg/m         Assessment & Plan:  [name] in today with chief complaint of Pain Management   1. Lumbosacral spondylosis without myelopathy (Primary) Moist heat rets  2. SPONDYLOLISTHESIS  - HYDROcodone-acetaminophen (NORCO) 7.5-325 MG tablet; Take 1 tablet by mouth in the morning, at noon, and at bedtime.  Dispense: 90 tablet; Refill: 0 - HYDROcodone-acetaminophen (NORCO) 7.5-325 MG tablet; Take 1 tablet by mouth in the morning, at noon, and at bedtime.  Dispense: 90 tablet; Refill: 0 - HYDROcodone-acetaminophen (NORCO) 7.5-325 MG tablet; Take 1 tablet by mouth in the morning, at noon, and at bedtime.  Dispense: 90 tablet; Refill: 0  3. Chronic obstructive pulmonary disease with acute exacerbation (HCC) - fluticasone furoate-vilanterol (BREO ELLIPTA) 100-25 MCG/ACT AEPB; Inhale 1 puff into the lungs daily.  Dispense: 60 each; Refill: 5  4. Dyspareunia in female - estradiol (ESTRACE) 0.1 MG/GM vaginal cream; Place 1 Applicatorful vaginally 3 (three) times a week.  Dispense: 42.5 g; Refill: 3  5. Essential hypertension - furosemide (LASIX) 40 MG tablet; TAKE 1 TABLET DAILY AS NEEDED FOR EXCESSIVE FLUID.  Dispense: 30 tablet; Refill: 0  6. GAD (generalized anxiety disorder) - lamoTRIgine (LAMICTAL) 150 MG tablet; Take 1 tablet (150 mg total)  by mouth daily.  Dispense: 90 tablet; Refill: 1  7. Herpes simplex virus (HSV) infection - valACYclovir (VALTREX) 1000 MG tablet; Take 1 tablet (1,000 mg total) by mouth 3 (three) times daily as needed (with active outbreaks.).  Dispense: 21 tablet; Refill: 0  8. Other chest pain - isosorbide mononitrate (IMDUR) 60 MG 24 hr tablet; Take 1 tablet (60 mg total) by mouth daily.  Dispense: 90 tablet; Refill: 1    The above assessment and management plan was discussed with  the patient. The patient verbalized understanding of and has agreed to the management plan. Patient is aware to call the clinic if symptoms persist or worsen. Patient is aware when to return to the clinic for a follow-up visit. Patient educated on when it is appropriate to go to the emergency department.   [name] Daphine Deutscher, FNP

## 2023-03-19 NOTE — Patient Instructions (Signed)
Opioid Pain Medicine Management Opioids are powerful medicines that are used to treat moderate to severe pain. When used for short periods of time, they can help you to: Sleep better. Do better in physical or occupational therapy. Feel better in the first few days after an injury. Recover from surgery. Opioids should be taken with the supervision of a trained health care provider. They should be taken for the shortest period of time possible. This is because opioids can be addictive, and the longer you take opioids, the greater your risk of addiction. This addiction can also be called opioid use disorder. What are the risks? Using opioid pain medicines for longer than 3 days increases your risk of side effects. Side effects include: Constipation. Nausea and vomiting. Breathing difficulties (respiratory depression). Drowsiness. Confusion. Opioid use disorder. Itching. Taking opioid pain medicine for a long period of time can affect your ability to do daily tasks. It also puts you at risk for: Motor vehicle crashes. Depression. Suicide. Heart attack. Overdose, which can be life-threatening. What is a pain treatment plan? A pain treatment plan is an agreement between you and your health care provider. Pain is unique to each person, and treatments vary depending on your condition. To manage your pain, you and your health care provider need to work together. To help you do this: Discuss the goals of your treatment, including how much pain you might expect to have and how you will manage the pain. Review the risks and benefits of taking opioid medicines. Remember that a good treatment plan uses more than one approach and minimizes the chance of side effects. Be honest about the amount of medicines you take and about any drug or alcohol use. Get pain medicine prescriptions from only one health care provider. Pain can be managed with many types of alternative treatments. Ask your health care  provider to refer you to one or more specialists who can help you manage pain through: Physical or occupational therapy. Counseling (cognitive behavioral therapy). Good nutrition. Biofeedback. Massage. Meditation. Non-opioid medicine. Following a gentle exercise program. How to use opioid pain medicine Taking medicine Take your pain medicine exactly as told by your health care provider. Take it only when you need it. If your pain gets less severe, you may take less than your prescribed dose if your health care provider approves. If you are not having pain, do nottake pain medicine unless your health care provider tells you to take it. If your pain is severe, do nottry to treat it yourself by taking more pills than instructed on your prescription. Contact your health care provider for help. Write down the times when you take your pain medicine. It is easy to become confused while on pain medicine. Writing the time can help you avoid overdose. Take other over-the-counter or prescription medicines only as told by your health care provider. Keeping yourself and others safe  While you are taking opioid pain medicine: Do not drive, use machinery, or power tools. Do not sign legal documents. Do not drink alcohol. Do not take sleeping pills. Do not supervise children by yourself. Do not do activities that require climbing or being in high places. Do not go to a lake, river, ocean, spa, or swimming pool. Do not share your pain medicine with anyone. Keep pain medicine in a locked cabinet or in a secure area where pets and children cannot reach it. Stopping your use of opioids If you have been taking opioid medicine for more than a few weeks,   you may need to slowly decrease (taper) how much you take until you stop completely. Tapering your use of opioids can decrease your risk of symptoms of withdrawal, such as: Pain and cramping in the  abdomen. Nausea. Sweating. Sleepiness. Restlessness. Uncontrollable shaking (tremors). Cravings for the medicine. Do not attempt to taper your use of opioids on your own. Talk with your health care provider about how to do this. Your health care provider may prescribe a step-down schedule based on how much medicine you are taking and how long you have been taking it. Getting rid of leftover pills Do not save any leftover pills. Get rid of leftover pills safely by: Taking the medicine to a prescription take-back program. This is usually offered by the county or law enforcement. Bringing them to a pharmacy that has a drug disposal container. Flushing them down the toilet. Check the label or package insert of your medicine to see whether this is safe to do. Throwing them out in the trash. Check the label or package insert of your medicine to see whether this is safe to do. If it is safe to throw it out, remove the medicine from the original container, put it into a sealable bag or container, and mix it with used coffee grounds, food scraps, dirt, or cat litter before putting it in the trash. Follow these instructions at home: Activity Do exercises as told by your health care provider. Avoid activities that make your pain worse. Return to your normal activities as told by your health care provider. Ask your health care provider what activities are safe for you. General instructions You may need to take these actions to prevent or treat constipation: Drink enough fluid to keep your urine pale yellow. Take over-the-counter or prescription medicines. Eat foods that are high in fiber, such as beans, whole grains, and fresh fruits and vegetables. Limit foods that are high in fat and processed sugars, such as fried or sweet foods. Keep all follow-up visits. This is important. Where to find support If you have been taking opioids for a long time, you may benefit from receiving support for quitting  from a local support group or counselor. Ask your health care provider for a referral to these resources in your area. Where to find more information Centers for Disease Control and Prevention (CDC): FootballExhibition.com.br [place] Food and Drug Administration (FDA): PumpkinSearch.com.ee Get help right away if: You may have taken too much of an opioid (overdosed). Common symptoms of an overdose: Your breathing is slower or more shallow than normal. You have a very slow heartbeat (pulse). You have slurred speech. You have nausea and vomiting. Your pupils become very small. You have other potential symptoms: You are very confused. You faint or feel like you will faint. You have cold, clammy skin. You have blue lips or fingernails. You have thoughts of harming yourself or harming others. These symptoms may represent a serious problem that is an emergency. Do not wait to see if the symptoms will go away. Get medical help right away. Call your local emergency services (911 in the [place] Do not drive yourself to the hospital.  If you ever feel like you may hurt yourself or others, or have thoughts about taking your own life, get help right away. Go to your nearest emergency department or: Call your local emergency services (911 in the [place] Call the [facility] (763-300-3487 in the [place] Call a suicide crisis helpline, such as the National Suicide Prevention Lifeline at  306-483-2759 or 988 in the [place] This is open 24 hours a day in the [place] Text the Crisis Text Line at (912)335-8467 (in the [place] Summary Opioid medicines can help you manage moderate to severe pain for a short period of time. A pain treatment plan is an agreement between you and your health care provider. Discuss the goals of your treatment, including how much pain you might expect to have and how you will manage the pain. If you think that you or someone else may have taken too much of an opioid, get medical help right away. This  information is not intended to replace advice given to you by your health care provider. Make sure you discuss any questions you have with your health care provider. Document Revised: 10/10/2020 Document Reviewed: 06/27/2020 Elsevier Patient Education  2024 [employer].

## 2023-03-23 ENCOUNTER — Other Ambulatory Visit: Payer: Self-pay | Admitting: Nurse Practitioner

## 2023-03-23 ENCOUNTER — Other Ambulatory Visit: Payer: Self-pay | Admitting: Sports Medicine

## 2023-03-23 DIAGNOSIS — R0789 Other chest pain: Secondary | ICD-10-CM

## 2023-03-28 ENCOUNTER — Other Ambulatory Visit: Payer: Self-pay | Admitting: Nurse Practitioner

## 2023-03-28 DIAGNOSIS — F411 Generalized anxiety disorder: Secondary | ICD-10-CM

## 2023-03-30 ENCOUNTER — Telehealth: Payer: Self-pay | Admitting: Nurse Practitioner

## 2023-03-30 ENCOUNTER — Telehealth: Payer: Self-pay

## 2023-03-30 ENCOUNTER — Ambulatory Visit: Payer: 59 | Admitting: Cardiology

## 2023-03-30 ENCOUNTER — Encounter: Payer: Self-pay | Admitting: Family

## 2023-03-30 ENCOUNTER — Telehealth (INDEPENDENT_AMBULATORY_CARE_PROVIDER_SITE_OTHER): Payer: 59 | Admitting: Family

## 2023-03-30 DIAGNOSIS — U071 COVID-19: Secondary | ICD-10-CM | POA: Diagnosis not present

## 2023-03-30 MED ORDER — BENZONATATE 200 MG PO CAPS: 200.0000 mg | ORAL_CAPSULE | Freq: Three times a day (TID) | ORAL | 1 refills | Status: DC | PRN

## 2023-03-30 MED ORDER — MOLNUPIRAVIR EUA 200MG CAPSULE: 4.0000 | ORAL_CAPSULE | Freq: Two times a day (BID) | ORAL | 0 refills | Status: AC

## 2023-03-30 NOTE — Progress Notes (Signed)
Virtual Visit Consent   [name], you are scheduled for a virtual visit with a [facility] provider today. Just as with appointments in the office, your consent must be obtained to participate. Your consent will be active for this visit and any virtual visit you may have with one of our providers in the next 365 days. If you have a MyChart account, a copy of this consent can be sent to you electronically.  As this is a virtual visit, video technology does not allow for your provider to perform a traditional examination. This may limit your provider's ability to fully assess your condition. If your provider identifies any concerns that need to be evaluated in person or the need to arrange testing (such as labs, EKG, etc.), we will make arrangements to do so. Although advances in technology are sophisticated, we cannot ensure that it will always work on either your end or our end. If the connection with a video visit is poor, the visit may have to be switched to a telephone visit. With either a video or telephone visit, we are not always able to ensure that we have a secure connection.  By engaging in this virtual visit, you consent to the provision of healthcare and authorize for your insurance to be billed (if applicable) for the services provided during this visit. Depending on your insurance coverage, you may receive a charge related to this service.  I need to obtain your verbal consent now. Are you willing to proceed with your visit today? [name] has provided verbal consent on 03/30/2023 for a virtual visit (video or telephone). Jannifer Rodney, FNP  Date: 03/30/2023 2:31 PM  Virtual Visit via Video Note   I, Jannifer Rodney, connected with  [name]  ([id number], 1955-04-04) on 03/30/23 at  2:05 PM EST by a video-enabled telemedicine application and verified that I am speaking with the correct person using two identifiers.  Location: Patient: Virtual Visit Location  Patient: Home Provider: Virtual Visit Location Provider: Home Office   I discussed the limitations of evaluation and management by telemedicine and the availability of in person appointments. The patient expressed understanding and agreed to proceed.    History of Present Illness: [name] is a 67 y.o. who identifies as a female who was assigned female at birth, and is being seen today for COVID. Reports her symptoms started three days ago and tested positive today.   HPI: URI  This is a new problem. The current episode started in the past 7 days. The problem has been unchanged. The maximum temperature recorded prior to her arrival was 101 - 101.9 F. Associated symptoms include congestion, coughing, headaches, joint pain, rhinorrhea, a sore throat and wheezing. Pertinent negatives include no ear pain, sinus pain or sneezing. She has tried decongestant for the symptoms. The treatment provided mild relief.    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   Hypokalemia 11/25/2020   S/P laparoscopic fundoplication 10/31/2020   Status post total replacement of left hip 01/31/2020   Unilateral primary osteoarthritis, left hip 12/21/2019   S/P shoulder replacement, right 11/15/2019   Overweight (BMI 25.0-29.9) 04/15/2019   Diverticulosis 04/14/2019   Chronic diastolic CHF (congestive heart  failure) (HCC) 04/14/2019   Unstable angina (HCC) 10/20/2017   Chronic pain 09/23/2016   Pre-diabetes 09/02/2016   Recurrent major depressive disorder, in partial remission (HCC) 07/14/2016   Seizures (HCC) 07/14/2016   GAD (generalized anxiety disorder) 07/14/2016   Insomnia 09/05/2014   Allergic rhinitis 09/05/2014    Cervical spondylosis without myelopathy 12/19/2013    Class: Chronic   Neural foraminal stenosis of cervical spine 12/19/2013   Cervical radiculitis 09/19/2013   Lumbosacral spondylosis without myelopathy 11/16/2012   Postlaminectomy syndrome, lumbar region 11/16/2012   Herpes simplex virus (HSV) infection 10/31/2008   Hyperlipidemia with target LDL less than 100 10/31/2008   Primary hypertension 10/31/2008   GERD 10/31/2008   SPINAL STENOSIS OF LUMBAR REGION 10/31/2008   Myalgia 10/31/2008   Osteoporosis 10/31/2008   SPONDYLOLISTHESIS 10/31/2008   SYNCOPE 10/31/2008   Sleep apnea 10/31/2008    Allergies:  Allergies  Allergen Reactions   Codeine Other (See Comments)    "I will have a heart attack."   Morphine And Codeine Other (See Comments)    "It will cause me to have a heart attack."   Ambien [Zolpidem Tartrate] Nausea And Vomiting   Clonidine Derivatives Nausea And Vomiting    gerd - caused acid reflux per pt   Metformin And Related Nausea And Vomiting    cramping from metformin   Lyrica [Pregabalin] Swelling and Other (See Comments)    Weight gain   Neurontin [Gabapentin] Other (See Comments)    Causes elevated LFTs    Medications:  Current Outpatient Medications:    benzonatate (TESSALON) 200 MG capsule, Take 1 capsule (200 mg total) by mouth 3 (three) times daily as needed., Disp: 30 capsule, Rfl: 1   molnupiravir EUA (LAGEVRIO) 200 mg CAPS capsule, Take 4 capsules (800 mg total) by mouth 2 (two) times daily for 5 days., Disp: 40 capsule, Rfl: 0   Accu-Chek Softclix Lancets lancets, test blood sugar up to 4 times a day as directed. Dx R73.03, Disp: 400 each, Rfl: 3   acyclovir ointment (ZOVIRAX) 5 %, apply 1 application topically every 3 (three) hours as needed (outbreaks)., Disp: 30 g, Rfl: 1   albuterol (VENTOLIN HFA) 108 (90 Base) MCG/ACT inhaler, inhale 2 puffs every 6 hours as needed for shortness of breath and wheezing., Disp: 8 g, Rfl: 2   Alcohol Swabs (B-D  SINGLE USE SWABS REGULAR) PADS, USE 1 PAD DAILY WHEN CHECKING BLOOD SUGAR. R73.03, Disp: 100 each, Rfl: 3   aspirin EC 325 MG tablet, Take 1 tablet (325 mg total) by mouth daily., Disp: 30 tablet, Rfl: 0   blood glucose meter kit and supplies, Dispense based on patient and insurance preference. Use up to four times daily as directed. (FOR ICD-10 E10.9, E11.9)., Disp: 1 each, Rfl: 0   busPIRone (BUSPAR) 10 MG tablet, Take 1 tablet (10 mg total) by mouth 2 (two) times daily as needed., Disp: 60 tablet, Rfl: 5   butalbital-acetaminophen-caffeine (FIORICET) 50-325-40 MG tablet, take 1 tablet by mouth every 6 (six) hours as needed for headache., Disp: 20 tablet, Rfl: 0   carvedilol (COREG) 3.125 MG tablet, TAKE 1/2 (0.5) TABLET TWICE A DAY WITH A MEAL; BLOOD PRESSURE OVER 150 BEFORE TAKING., Disp: 90 tablet, Rfl: 1   celecoxib (CELEBREX) 200 MG capsule, take 1 capsule (200 MILLIGRAM total) by mouth daily., Disp: 30 capsule, Rfl: 0   dexlansoprazole (DEXILANT) 60 MG capsule, Take 1 capsule (60 mg total) by mouth daily., Disp: 90 capsule, Rfl: 1   Dextromethorphan-GG-APAP (CORICIDIN  HBP COLD/COUGH/FLU) 10-200-325 MG/15ML LIQD, Take 30 mLs by mouth every 4 (four) hours as needed (cough)., Disp: 355 mL, Rfl: 0   diclofenac Sodium (VOLTAREN) 1 % GEL, APPLY 4 GRAMS TOPICALLY 4 TIMES A DAY. (Patient taking differently: Apply 4 g topically 4 (four) times daily as needed (pain).), Disp: 100 g, Rfl: 0   DULoxetine (CYMBALTA) 60 MG capsule, Take 1 capsule (60 mg total) by mouth at bedtime., Disp: 90 capsule, Rfl: 1   empagliflozin (JARDIANCE) 25 MG TABS tablet, Take 1 tablet (25 mg total) by mouth daily., Disp: 90 tablet, Rfl: 1   escitalopram (LEXAPRO) 20 MG tablet, Take 1 tablet (20 mg total) by mouth daily., Disp: 90 tablet, Rfl: 1   estradiol (ESTRACE) 0.1 MG/GM vaginal cream, Place 1 Applicatorful vaginally 3 (three) times a week., Disp: 42.5 g, Rfl: 3   FIBER PO, Take 1 tablet by mouth in the morning and at  bedtime., Disp: , Rfl:    fludrocortisone (FLORINEF) 0.1 MG tablet, take 1 tablet ( total) by mouth every other day., Disp: 15 tablet, Rfl: 0   fluticasone (FLONASE) 50 MCG/ACT nasal spray, USE 1 SPRAY IN EACH NOSTRIL ONCE DAILY. (Patient taking differently: Place 1 spray into both nostrils daily as needed for allergies.), Disp: 16 g, Rfl: 5   fluticasone furoate-vilanterol (BREO ELLIPTA) 100-25 MCG/ACT AEPB, Inhale 1 puff into the lungs daily., Disp: 60 each, Rfl: 5   furosemide (LASIX) 40 MG tablet, TAKE 1 TABLET DAILY AS NEEDED FOR EXCESSIVE FLUID., Disp: 30 tablet, Rfl: 0   Glucagon, rDNA, (GLUCAGON EMERGENCY) 1 MG KIT, Inject 1 mg into the skin as needed., Disp: 1 kit, Rfl: 5   glucose blood (ACCU-CHEK GUIDE) test strip, TEST BLOOD SUGAR UP TO 4 TIMES A DAY AS DIRECTED. R73.03, Disp: 400 each, Rfl: 3   hydrALAZINE (APRESOLINE) 25 MG tablet, TAKE 1 TABLET BY MOUTH 3 TIMES DAILY AS NEEDED (FOR SEVERE HYPERTENSION/SYSTOLIC NUMBER 170 OR GREATER)., Disp: 90 tablet, Rfl: 5   [START ON 05/20/2023] HYDROcodone-acetaminophen (NORCO) 7.5-325 MG tablet, Take 1 tablet by mouth in the morning, at noon, and at bedtime., Disp: 90 tablet, Rfl: 0   [START ON 04/20/2023] HYDROcodone-acetaminophen (NORCO) 7.5-325 MG tablet, Take 1 tablet by mouth in the morning, at noon, and at bedtime., Disp: 90 tablet, Rfl: 0   HYDROcodone-acetaminophen (NORCO) 7.5-325 MG tablet, Take 1 tablet by mouth in the morning, at noon, and at bedtime., Disp: 90 tablet, Rfl: 0   ipratropium (ATROVENT) 0.02 % nebulizer solution, USE 1 VIAL ( ) IN NEBULIZER EVERY 6 HOURS AS NEEDED FOR WHEEZING OR SHORTNESS OF BREATH., Disp: 150 mL, Rfl: 2   isosorbide mononitrate (IMDUR) 30 MG 24 hr tablet, take 1 and ONE-HALF (1/2) tablets (45 milligram total) by mouth daily., Disp: 45 tablet, Rfl: 0   isosorbide mononitrate (IMDUR) 60 MG 24 hr tablet, Take 1 tablet (60 mg total) by mouth daily., Disp: 90 tablet, Rfl: 1   lamoTRIgine (LAMICTAL) 150 MG  tablet, Take 1 tablet (150 mg total) by mouth daily., Disp: 90 tablet, Rfl: 1   levETIRAcetam (KEPPRA) 500 MG tablet, Take 1 tablet (500 mg total) by mouth 2 (two) times daily., Disp: 180 tablet, Rfl: 1   levocetirizine (XYZAL) 5 MG tablet, take 1 tablet by mouth every morning., Disp: 30 tablet, Rfl: 2   lidocaine (LIDODERM) 5 %, place 1 patch onto the skin daily. remove & discard patch within 12 hours or as directed by md, Disp: 10 patch, Rfl: 0   lidocaine (XYLOCAINE)  5 % ointment, apply to affected area 3 times a day as needed for mild or moderate pain., Disp: 30 g, Rfl: 0   methocarbamol (ROBAXIN) 500 MG tablet, Take 1 tablet (500 mg total) by mouth 4 (four) times daily., Disp: 30 tablet, Rfl: 4   nitroGLYCERIN (NITROSTAT) 0.4 MG SL tablet, place one (1) tablet under tongue every 5 minutes up to (3) doses as needed for chest pain., Disp: 25 tablet, Rfl: 0   ondansetron (ZOFRAN) 4 MG tablet, take 1 tablet by mouth every 8 hours as needed for nausea and vomiting., Disp: 20 tablet, Rfl: 0   oxybutynin (DITROPAN-XL) 5 MG 24 hr tablet, Take 1 tablet (5 mg total) by mouth at bedtime., Disp: 90 tablet, Rfl: 1   Probiotic Product (PROBIOTIC PO), Take 1 capsule by mouth in the morning., Disp: , Rfl:    rosuvastatin (CRESTOR) 10 MG tablet, Take 1 tablet (10 mg total) by mouth daily., Disp: 90 tablet, Rfl: 1   traZODone (DESYREL) 150 MG tablet, Take 1 tablet (150 mg total) by mouth at bedtime., Disp: 90 tablet, Rfl: 1   valACYclovir (VALTREX) 1000 MG tablet, Take 1 tablet (1,000 mg total) by mouth 3 (three) times daily as needed (with active outbreaks.)., Disp: 21 tablet, Rfl: 0   valACYclovir (VALTREX) 500 MG tablet, TAKE (1) TABLET TWICE DAILY., Disp: 180 tablet, Rfl: 1  Observations/Objective: Patient is well-developed, well-nourished in no acute distress.  Resting comfortably  at home.  Head is normocephalic, atraumatic.  No labored breathing.  Speech is clear and coherent with logical content.   Patient is alert and oriented at baseline.  Coarse nonproductive cough  Assessment and Plan: 1. COVID-19 (Primary) - molnupiravir EUA (LAGEVRIO) 200 mg CAPS capsule; Take 4 capsules (800 mg total) by mouth 2 (two) times daily for 5 days.  Dispense: 40 capsule; Refill: 0 - benzonatate (TESSALON) 200 MG capsule; Take 1 capsule (200 mg total) by mouth 3 (three) times daily as needed.  Dispense: 30 capsule; Refill: 1  COVID positive, rest, force fluids, tylenol as needed, report any worsening symptoms such as increased shortness of breath, swelling, or continued high fevers. Possible adverse effects discussed with antivirals.    Follow Up Instructions: I discussed the assessment and treatment plan with the patient. The patient was provided an opportunity to ask questions and all were answered. The patient agreed with the plan and demonstrated an understanding of the instructions.  A copy of instructions were sent to the patient via MyChart unless otherwise noted below.     The patient was advised to call back or seek an in-person evaluation if the symptoms worsen or if the condition fails to improve as anticipated.    Jannifer Rodney, FNP

## 2023-03-30 NOTE — Telephone Encounter (Signed)
Copied from CRM [id number]. Topic: Appointments - Scheduling Inquiry for Clinic >> Mar 30, 2023  1:17 PM Ivette P wrote: Reason for CRM: Patient has Covid and requesting a call from Dr. [name], call back Number [phone]

## 2023-03-30 NOTE — Telephone Encounter (Signed)
Pt scheduled for video visit

## 2023-03-30 NOTE — Telephone Encounter (Unsigned)
Copied from CRM [id number]. Topic: Appointments - Scheduling Inquiry for Clinic >> Mar 30, 2023  1:17 PM Ivette P wrote: Reason for CRM: Patient has Covid and requesting a call from Dr. [name], call back Number [phone]

## 2023-03-30 NOTE — Telephone Encounter (Signed)
Patient had two telephone calls in pools. Patient was contacted via telephone and give virtual appt for today

## 2023-04-03 ENCOUNTER — Ambulatory Visit: Payer: 59 | Admitting: Cardiology

## 2023-04-03 MED ORDER — PROMETHAZINE-DM 6.25-15 MG/5ML PO SYRP: 5.0000 mL | ORAL_SOLUTION | Freq: Three times a day (TID) | ORAL | 0 refills | Status: DC | PRN

## 2023-04-15 ENCOUNTER — Other Ambulatory Visit: Payer: Self-pay | Admitting: Sports Medicine

## 2023-04-15 ENCOUNTER — Other Ambulatory Visit: Payer: Self-pay | Admitting: Family

## 2023-04-15 ENCOUNTER — Other Ambulatory Visit: Payer: Self-pay | Admitting: Nurse Practitioner

## 2023-04-15 ENCOUNTER — Other Ambulatory Visit: Payer: Self-pay | Admitting: Cardiology

## 2023-04-15 DIAGNOSIS — B009 Herpesviral infection, unspecified: Secondary | ICD-10-CM

## 2023-04-15 DIAGNOSIS — I10 Essential (primary) hypertension: Secondary | ICD-10-CM

## 2023-04-15 DIAGNOSIS — R0789 Other chest pain: Secondary | ICD-10-CM

## 2023-04-21 ENCOUNTER — Encounter: Payer: Self-pay | Admitting: Cardiology

## 2023-04-21 ENCOUNTER — Ambulatory Visit: Payer: 59 | Attending: Cardiology | Admitting: Cardiology

## 2023-04-21 VITALS — BP 118/76 | HR 70 | Ht 62.0 in | Wt 122.8 lb

## 2023-04-21 DIAGNOSIS — R0789 Other chest pain: Secondary | ICD-10-CM

## 2023-04-21 DIAGNOSIS — G909 Disorder of the autonomic nervous system, unspecified: Secondary | ICD-10-CM

## 2023-04-21 DIAGNOSIS — E782 Mixed hyperlipidemia: Secondary | ICD-10-CM

## 2023-04-21 DIAGNOSIS — R55 Syncope and collapse: Secondary | ICD-10-CM | POA: Diagnosis not present

## 2023-04-21 DIAGNOSIS — I10 Essential (primary) hypertension: Secondary | ICD-10-CM

## 2023-04-21 MED ORDER — ISOSORBIDE MONONITRATE ER 60 MG PO TB24: 60.0000 mg | ORAL_TABLET | Freq: Two times a day (BID) | ORAL | 6 refills | Status: DC

## 2023-04-21 NOTE — Patient Instructions (Signed)
Medication Instructions:  Increase Imdur to '60mg'$  twice a day  Continue all other medications.     Labwork: none  Testing/Procedures: none  Follow-Up: 6 months   Any Other Special Instructions Will Be Listed Below (If Applicable).   If you need a refill on your cardiac medications before your next appointment, please call your pharmacy.

## 2023-04-21 NOTE — Progress Notes (Signed)
Clinical Summary Ms. Musacchia is a 68 y.o.female seen today for follow up of the following medical problems.    1. 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 Issues getting arranged with transporation.        - we changed florinef to 0.1mg  every other day due to high bp's  - occasial leg swelling every few months, suspect gradual effects of the florinef. Has not had any significant cardiac dysfunction by echo. Resolves with few days of prn lasix.  - home bp's are well controlled. No recent syncope        2. Chest pain - several year history of chest pain - normal cath in 2013, normal stress test 2013 - admit to [facility] 07/2014 with chest pain. Negative workup for ACS. She was discharged with an out patient Lexiscan which showed no evidence of ischemia.   -  echo 11/2014 LVEF 55-60%, no WMAs.  - 09/2017 nuclear stress no ischemia.  - started on imdur by pcp with improved symptoms.      12/2018 admission with chest pain - no objective evidence of ischemia by EKG or enzymes this admission - echo LVE 60-65%, grade I diastolic dysfunction  -  symptoms were noncardiac in description. Lasting constantly x 6 days, worst with deep breathing.   -chronic ches tpain well controlled, has done better on imdur.     - still with some chest pains, once every few months - has done well on imdur.    3. Hyperlipidemia -12/2020 TC 125 TG 81 HDL 43 LDL 66 - 06/2021 TC 1610 TG 83 HDL 63 LDL 73 - 10/2021 TC 960 TG 454 HDL 60 LDL 74 - 05/2022 TC 098 TG 119 HDL 63 LDL 76 - 11/2022 TC 147 TG 96 HDL 45 LDL 64 - she is on crestor     4. COVID + 03/2023      T[place] cruises to [place] on the regular.  Past  Medical History:  Diagnosis Date   Allergy    Anemia    Anginal pain (HCC)    last time    Anxiety    Arthritis    RHEUMATOID   Asthma    Bipolar 1 disorder (HCC)    Blood transfusion without reported diagnosis    x 3   Cataracts, bilateral 07/2017   CHF (congestive heart failure) (HCC)    COPD (chronic obstructive pulmonary disease) (HCC)    Coronary artery disease    reported hx of "MI";  Echo 2009 with normal LVF;  Myoview 05/2011: no ischemia   Depression    Diabetes mellitus without complication (HCC)    Dyslipidemia    Dysrhythmia    SVT   Elevated liver enzymes 06/30/2022   Esophageal stricture    Fibromyalgia    GERD (gastroesophageal reflux disease)    H/O hiatal hernia    Head injury, unspecified    Headache    migraines   Herpes simplex infection    History of kidney stones    History of loop recorder 08/10/2017   Managed by Dr. Sharrell Ku   Hyperlipidemia    Hypertension    Insomnia    Myocardial infarction [facility])    age 89   Osteoporosis  Pneumonia    hx   Seizures (HCC)    none in the last 3-4 years as of 11/03/22 per patient   Shortness of breath    Sleep apnea    mild, does not require positive pressure treatment. (02/03/22)   Spinal stenosis of lumbar region    Spondylolisthesis    Status post placement of implantable loop recorder    Supraventricular tachycardia (HCC)    Syncope and collapse    s/p ILR; no arhythmogenic cause identified   UTI (lower urinary tract infection)      Allergies  Allergen Reactions   Codeine Other (See Comments)    "I will have a heart attack."   Morphine And Codeine Other (See Comments)    "It will cause me to have a heart attack."   Ambien [Zolpidem Tartrate] Nausea And Vomiting   Clonidine Derivatives Nausea And Vomiting    gerd - caused acid reflux per pt   Metformin And Related Nausea And Vomiting    cramping from metformin   Lyrica [Pregabalin] Swelling and Other (See Comments)    Weight gain    Neurontin [Gabapentin] Other (See Comments)    Causes elevated LFTs      Current Outpatient Medications  Medication Sig Dispense Refill   Accu-Chek Softclix Lancets lancets test blood sugar up to 4 times a day as directed. Dx R73.03 400 each 3   acyclovir ointment (ZOVIRAX) 5 % apply 1 application topically every 3 (three) hours as needed (outbreaks). 30 g 0   albuterol (VENTOLIN HFA) 108 (90 Base) MCG/ACT inhaler inhale 2 puffs every 6 hours as needed for shortness of breath and wheezing. 18 g 0   Alcohol Swabs (B-D SINGLE USE SWABS REGULAR) PADS USE 1 PAD DAILY WHEN CHECKING BLOOD SUGAR. R73.03 100 each 3   aspirin EC 325 MG tablet Take 1 tablet (325 mg total) by mouth daily. 30 tablet 0   benzonatate (TESSALON) 200 MG capsule Take 1 capsule (200 mg total) by mouth 3 (three) times daily as needed. 30 capsule 1   blood glucose meter kit and supplies Dispense based on patient and insurance preference. Use up to four times daily as directed. (FOR ICD-10 E10.9, E11.9). 1 each 0   busPIRone (BUSPAR) 10 MG tablet Take 1 tablet (10 mg total) by mouth 2 (two) times daily as needed. 60 tablet 5   butalbital-acetaminophen-caffeine (FIORICET) 50-325-40 MG tablet take 1 tablet by mouth every 6 (six) hours as needed for headache. 20 tablet 0   carvedilol (COREG) 3.125 MG tablet TAKE 1/2 (0.5) TABLET TWICE A DAY WITH A MEAL; BLOOD PRESSURE OVER 150 BEFORE TAKING. 90 tablet 1   celecoxib (CELEBREX) 200 MG capsule take 1 capsule (200 MILLIGRAM total) by mouth daily. 60 capsule 0   dexlansoprazole (DEXILANT) 60 MG capsule Take 1 capsule (60 mg total) by mouth daily. 90 capsule 1   Dextromethorphan-GG-APAP (CORICIDIN HBP COLD/COUGH/FLU) 10-200-325 MG/15ML LIQD Take 30 mLs by mouth every 4 (four) hours as needed (cough). 355 mL 0   diclofenac Sodium (VOLTAREN) 1 % GEL APPLY 4 GRAMS TOPICALLY 4 TIMES A DAY. (Patient taking differently: Apply 4 g topically 4 (four) times daily as needed (pain).) 100 g 0    DULoxetine (CYMBALTA) 60 MG capsule Take 1 capsule (60 mg total) by mouth at bedtime. 90 capsule 1   empagliflozin (JARDIANCE) 25 MG TABS tablet Take 1 tablet (25 mg total) by mouth daily. 90 tablet 1   escitalopram (LEXAPRO) 20 MG tablet Take 1  tablet (20 mg total) by mouth daily. 90 tablet 1   estradiol (ESTRACE) 0.1 MG/GM vaginal cream Place 1 Applicatorful vaginally 3 (three) times a week. 42.5 g 3   FIBER PO Take 1 tablet by mouth in the morning and at bedtime.     fludrocortisone (FLORINEF) 0.1 MG tablet take 1 tablet ( total) by mouth every other day. 15 tablet 0   fluticasone (FLONASE) 50 MCG/ACT nasal spray USE 1 SPRAY IN EACH NOSTRIL ONCE DAILY. (Patient taking differently: Place 1 spray into both nostrils daily as needed for allergies.) 16 g 5   fluticasone furoate-vilanterol (BREO ELLIPTA) 100-25 MCG/ACT AEPB Inhale 1 puff into the lungs daily. 60 each 5   furosemide (LASIX) 40 MG tablet take 1 tablet daily as needed for excessive fluid. 30 tablet 1   Glucagon, rDNA, (GLUCAGON EMERGENCY) 1 MG KIT Inject 1 mg into the skin as needed. 1 kit 5   glucose blood (ACCU-CHEK GUIDE) test strip TEST BLOOD SUGAR UP TO 4 TIMES A DAY AS DIRECTED. R73.03 400 each 3   hydrALAZINE (APRESOLINE) 25 MG tablet take 1 tablet by mouth 3 times daily as needed (for severe hypertension/systolic number 170 or greater). 90 tablet 1   [START ON 05/20/2023] HYDROcodone-acetaminophen (NORCO) 7.5-325 MG tablet Take 1 tablet by mouth in the morning, at noon, and at bedtime. 90 tablet 0   HYDROcodone-acetaminophen (NORCO) 7.5-325 MG tablet Take 1 tablet by mouth in the morning, at noon, and at bedtime. 90 tablet 0   ipratropium (ATROVENT) 0.02 % nebulizer solution USE 1 VIAL ( ) IN NEBULIZER EVERY 6 HOURS AS NEEDED FOR WHEEZING OR SHORTNESS OF BREATH. 150 mL 2   isosorbide mononitrate (IMDUR) 30 MG 24 hr tablet take 1 and one-half (1/2) tablets (45 milligram total) by mouth daily. 45 tablet 0   isosorbide  mononitrate (IMDUR) 60 MG 24 hr tablet Take 1 tablet (60 mg total) by mouth daily. 90 tablet 1   lamoTRIgine (LAMICTAL) 150 MG tablet Take 1 tablet (150 mg total) by mouth daily. 90 tablet 1   levETIRAcetam (KEPPRA) 500 MG tablet Take 1 tablet (500 mg total) by mouth 2 (two) times daily. 180 tablet 1   levocetirizine (XYZAL) 5 MG tablet take 1 tablet by mouth every morning. 30 tablet 2   lidocaine (LIDODERM) 5 % place 1 patch onto the skin daily. remove & discard patch within 12 hours or as directed by md 10 patch 0   lidocaine (XYLOCAINE) 5 % ointment apply to affected area 3 times a day as needed for mild or moderate pain. 240 g 0   methocarbamol (ROBAXIN) 500 MG tablet Take 1 tablet (500 mg total) by mouth 4 (four) times daily. 30 tablet 4   nitroGLYCERIN (NITROSTAT) 0.4 MG SL tablet place one (1) tablet under tongue every 5 minutes up to (3) doses as needed for chest pain. 25 tablet 0   ondansetron (ZOFRAN) 4 MG tablet take 1 tablet by mouth every 8 hours as needed for nausea and vomiting. 20 tablet 0   oxybutynin (DITROPAN-XL) 5 MG 24 hr tablet Take 1 tablet (5 mg total) by mouth at bedtime. 90 tablet 1   Probiotic Product (PROBIOTIC PO) Take 1 capsule by mouth in the morning.     promethazine-dextromethorphan (PROMETHAZINE-DM) 6.25-15 MG/5ML syrup Take 5 mLs by mouth 3 (three) times daily as needed for cough. 118 mL 0   rosuvastatin (CRESTOR) 10 MG tablet Take 1 tablet (10 mg total) by mouth daily. 90 tablet 1  traZODone (DESYREL) 150 MG tablet Take 1 tablet (150 mg total) by mouth at bedtime. 90 tablet 1   valACYclovir (VALTREX) 1000 MG tablet Take 1 tablet (1,000 mg total) by mouth 3 (three) times daily as needed (with active outbreaks.). 21 tablet 0   valACYclovir (VALTREX) 500 MG tablet TAKE (1) TABLET TWICE DAILY. 180 tablet 1   No current facility-administered medications for this visit.     Past Surgical History:  Procedure Laterality Date   BACK SURGERY     BREAST EXCISIONAL  BIOPSY Right    1990s   BREAST SURGERY     lumpectomy   CARDIAC CATHETERIZATION  10/06/2011   CATARACT EXTRACTION W/PHACO Right 07/31/2017   Procedure: CATARACT EXTRACTION PHACO AND INTRAOCULAR LENS PLACEMENT (IOC);  Surgeon: Fabio Pierce, MD;  Location: [facility];  Service: Ophthalmology;  Laterality: Right;  CDE: 2.33   CATARACT EXTRACTION W/PHACO Left 08/14/2017   Procedure: CATARACT EXTRACTION PHACO AND INTRAOCULAR LENS PLACEMENT (IOC);  Surgeon: Fabio Pierce, MD;  Location: [facility];  Service: Ophthalmology;  Laterality: Left;  CDE: 2.74   CHOLECYSTECTOMY     CYSTOSCOPY     stone   DIAGNOSTIC LAPAROSCOPY     laparoscopic cholecystectomy   DOPPLER ECHOCARDIOGRAPHY  2009   ESOPHAGOGASTRODUODENOSCOPY N/A 10/31/2020   Procedure: ESOPHAGOGASTRODUODENOSCOPY (EGD);  Surgeon: Gaynelle Adu, MD;  Location: [facility];  Service: General;  Laterality: N/A;   EYE SURGERY Bilateral 08/14/2017   cataract removal   GLUTEUS MINIMUS REPAIR Left 11/04/2022   Procedure: LEFT GLUTEUS MEDIUS REPAIR;  Surgeon: Huel Cote, MD;  Location: [facility];  Service: Orthopedics;  Laterality: Left;   head up tilt table testing  06/15/2007   [name]   HEMORRHOID SURGERY     HERNIA REPAIR     insertion of implatable loop recorder  08/11/2007   [name]   POSTERIOR CERVICAL FUSION/FORAMINOTOMY N/A 12/19/2013   Procedure: RIGHT C3-4.C4-5 AND C5-6 FORAMINOTOMIES;  Surgeon: Kerrin Champagne, MD;  Location: [facility] OR;  Service: Orthopedics;  Laterality: N/A;   TOTAL HIP ARTHROPLASTY Left 01/31/2020   Procedure: LEFT TOTAL HIP ARTHROPLASTY ANTERIOR APPROACH;  Surgeon: Kathryne Hitch, MD;  Location: [facility] OR;  Service: Orthopedics;  Laterality: Left;   TOTAL SHOULDER ARTHROPLASTY Right 11/15/2019   Procedure: RIGHT TOTAL SHOULDER ARTHROPLASTY;  Surgeon: Cammy Copa, MD;  Location: [facility];  Service: Orthopedics;  Laterality: Right;   TOTAL SHOULDER REVISION Right 06/11/2021   Procedure: RIGHT SHOULDER  CONVERSION TOTAL SHOULDER ARTHROPLASTY to REVERSE TOTAL SHOULDER ARTHROPLASTY;  Surgeon: Cammy Copa, MD;  Location: [facility] OR;  Service: Orthopedics;  Laterality: Right;   TUBAL LIGATION     UPPER GASTROINTESTINAL ENDOSCOPY     XI ROBOTIC ASSISTED HIATAL HERNIA REPAIR N/A 10/31/2020   Procedure: XI ROBOTIC ASSISTED HIATAL HERNIA REPAIR WITH PARTIAL FUNDOPLICATION;  Surgeon: Gaynelle Adu, MD;  Location: [facility];  Service: General;  Laterality: N/A;     Allergies  Allergen Reactions   Codeine Other (See Comments)    "I will have a heart attack."   Morphine And Codeine Other (See Comments)    "It will cause me to have a heart attack."   Ambien [Zolpidem Tartrate] Nausea And Vomiting   Clonidine Derivatives Nausea And Vomiting    gerd - caused acid reflux per pt   Metformin And Related Nausea And Vomiting    cramping from metformin   Lyrica [Pregabalin] Swelling and Other (See Comments)    Weight gain   Neurontin [Gabapentin] Other (See Comments)  Causes elevated LFTs       Family History  Problem Relation Age of Onset   Heart attack Father    Mental illness Father    Mental illness Mother    Heart attack Brother        stents   Alcohol abuse Brother    Heart disease Brother    Drug abuse Brother    Diabetes Brother    Colon cancer Maternal Aunt    Cirrhosis Brother    Stomach cancer Neg Hx    Esophageal cancer Neg Hx    Rectal cancer Neg Hx      Social History Ms. Rallo reports that she has never smoked. She has never used smokeless tobacco. Ms. Hofland reports no history of alcohol use.   Review of Systems CONSTITUTIONAL: No weight loss, fever, chills, weakness or fatigue.  HEENT: Eyes: No visual loss, blurred vision, double vision or yellow sclerae.No hearing loss, sneezing, congestion, runny nose or sore throat.  SKIN: No rash or itching.  CARDIOVASCULAR: per hpi RESPIRATORY: No shortness of breath, cough or sputum.  GASTROINTESTINAL: No anorexia,  nausea, vomiting or diarrhea. No abdominal pain or blood.  GENITOURINARY: No burning on urination, no polyuria NEUROLOGICAL: No headache, dizziness, syncope, paralysis, ataxia, numbness or tingling in the extremities. No change in bowel or bladder control.  MUSCULOSKELETAL: No muscle, back pain, joint pain or stiffness.  LYMPHATICS: No enlarged nodes. No history of splenectomy.  PSYCHIATRIC: No history of depression or anxiety.  ENDOCRINOLOGIC: No reports of sweating, cold or heat intolerance. No polyuria or polydipsia.  [name]   Physical Examination Today's Vitals   04/21/23 1000  BP: 118/76  Pulse: 70  SpO2: 97%  Weight: 122 lb 12.8 oz (55.7 kg)  Height: 5\' 2"  (1.575 m)   Body mass index is 22.46 kg/m.  Gen: resting comfortably, no acute distress HEENT: no scleral icterus, pupils equal round and reactive, no palptable cervical adenopathy,  CV: RRR, no m/rg, no jvd Resp: Clear to auscultation bilaterally GI: abdomen is soft, non-tender, non-distended, normal bowel sounds, no hepatosplenomegaly MSK: extremities are warm, no edema.  Skin: warm, no rash Neuro:  no focal deficits Psych: appropriate affect   Diagnostic Studies  05/2011 Lexiscan MPI No ischemia     Jan 2009 Echo LEFT VENTRICLE: - Left ventricular size was normal. - Overall left ventricular systolic function was normal. - There were no left ventricular regional wall motion abnormalities. - Left ventricular wall thickness was normal.  AORTIC VALVE: - The aortic valve was trileaflet. - Aortic valve thickness was normal.  AORTA: - The aortic root was normal in size. - The aortic arch was normal.  MITRAL VALVE: - Mitral valve structure was normal.  Doppler interpretation(s): - There was trivial mitral valvular regurgitation.  LEFT ATRIUM: - Left atrial size was normal.  PULMONARY VEINS: - The pulmonary veins were grossly normal.  RIGHT VENTRICLE: - Right ventricular size was normal. - Right  ventricular systolic function was normal. - Right ventricular wall thickness was normal.  PULMONIC VALVE: - The structure of the pulmonic valve appeared to be normal.  TRICUSPID VALVE: - The tricuspid valve structure was normal.  Doppler interpretation(s): - There was no significant tricuspid valvular regurgitation.  PULMONARY ARTERY: - The pulmonary artery was normal size.  RIGHT ATRIUM: - Right atrial size was normal.  SYSTEMIC VEINS: - The inferior vena cava was normal.  PERICARDIUM: - There was no pericardial effusion.  ---------------------------------------------------------------  SUMMARY - Overall left ventricular systolic function was  normal. There were no left ventricular regional wall motion abnormalities. - The pulmonary veins were grossly normal.     09/2011 Cath Hemodynamic Findings:   Central aortic pressure: 108/58   Left ventricular pressure: 107/10/14   Angiographic Findings:   Left main: No obstructive disease noted.   Left Anterior Descending Artery: Moderate to large sized vessel that courses to the apex. There is a moderate sized diagonal Riva Sesma. No obstructive disease noted.   Circumflex Artery: Large, dominant artery with moderate sized first obtuse marginal Itzae Miralles and left sided posterolateral Promise Weldin with no disease noted.   Right Coronary Artery: Small, non-dominant vessel with no disease noted.   Left Ventricular Angiogram:LVEF=65%.   Impression:   1. No angiographic evidence of CAD   2. Normal LV systolic function   3. Non-cardiac chest pain   Recommendations: No further ischemic workup.   Complications: None. The patient tolerated the procedure well.     11/2014 echo Study Conclusions  - Left ventricle: The cavity size was normal. Systolic function was   normal. The estimated ejection fraction was in the range of 55%   to 60%. Wall motion was normal; there were no regional wall   motion abnormalities. There was an increased relative    contribution of atrial contraction to ventricular filling.   Doppler parameters are consistent with abnormal left ventricular   relaxation (grade 1 diastolic dysfunction). - Mitral valve: There was trivial regurgitation. - Tricuspid valve: There was trivial regurgitation. - Pulmonic valve: There was trivial regurgitation.      09/2017 nuclear stress There was no ST segment deviation noted during stress. Nonspecific T wave flattening in aVL and V2 seen throughout study. Defect 1: There is a medium defect of mild severity present in the mid inferior, apical inferior and apical lateral location. This appears to be due to soft tissue attenuation given normal regional wall motion. No ischemic territories. This is a low risk study. Nuclear stress EF: 65%.       Assessment and Plan   1. Syncope/Autonomic dysfunction/HTN - difficult balance between severe symptomatic both hypotension and HTN.  -unorthodox regimen but through trial and error has done fairly well for her all things considred and we have continued -symptoms and bp's are stable, continue current meds     2. Chest pain - long history of symptoms with multiple negative stress tests, most recently 09/2017  Lexiscan was negative. Cath 2013 with patent vessels. Symptoms did improve with imdur -chronic stable infrequent symptoms, continue current meds   3. Hyperlipidemia - at goal, continue current meds        Antoine Poche, M.D.

## 2023-04-22 ENCOUNTER — Other Ambulatory Visit: Payer: Self-pay | Admitting: Nurse Practitioner

## 2023-04-22 DIAGNOSIS — J441 Chronic obstructive pulmonary disease with (acute) exacerbation: Secondary | ICD-10-CM

## 2023-04-23 ENCOUNTER — Other Ambulatory Visit: Payer: Self-pay | Admitting: Nurse Practitioner

## 2023-05-21 ENCOUNTER — Other Ambulatory Visit: Payer: Self-pay | Admitting: Nurse Practitioner

## 2023-05-21 DIAGNOSIS — B009 Herpesviral infection, unspecified: Secondary | ICD-10-CM

## 2023-05-21 DIAGNOSIS — R0789 Other chest pain: Secondary | ICD-10-CM

## 2023-05-22 ENCOUNTER — Other Ambulatory Visit: Payer: Self-pay | Admitting: Nurse Practitioner

## 2023-05-24 ENCOUNTER — Other Ambulatory Visit: Payer: Self-pay | Admitting: Nurse Practitioner

## 2023-05-24 DIAGNOSIS — B009 Herpesviral infection, unspecified: Secondary | ICD-10-CM

## 2023-05-28 ENCOUNTER — Encounter: Payer: Self-pay | Admitting: Sports Medicine

## 2023-05-28 ENCOUNTER — Ambulatory Visit: Payer: 59 | Admitting: Sports Medicine

## 2023-05-28 ENCOUNTER — Other Ambulatory Visit: Payer: Self-pay

## 2023-05-28 DIAGNOSIS — M1812 Unilateral primary osteoarthritis of first carpometacarpal joint, left hand: Secondary | ICD-10-CM

## 2023-05-28 DIAGNOSIS — M79645 Pain in left finger(s): Secondary | ICD-10-CM

## 2023-05-28 DIAGNOSIS — M1811 Unilateral primary osteoarthritis of first carpometacarpal joint, right hand: Secondary | ICD-10-CM

## 2023-05-28 DIAGNOSIS — M79644 Pain in right finger(s): Secondary | ICD-10-CM

## 2023-05-28 MED ORDER — BETAMETHASONE SOD PHOS & ACET 6 (3-3) MG/ML IJ SUSP
6.0000 mg | INTRAMUSCULAR | Status: AC | PRN
Start: 2023-05-28 — End: 2023-05-28
  Administered 2023-05-28: 6 mg via INTRA_ARTICULAR

## 2023-05-28 MED ORDER — CELECOXIB 200 MG PO CAPS: 200.0000 mg | ORAL_CAPSULE | Freq: Every day | ORAL | 0 refills | Status: DC | PRN

## 2023-05-28 MED ORDER — BETAMETHASONE SOD PHOS & ACET 6 (3-3) MG/ML IJ SUSP
6.0000 mg | INTRAMUSCULAR | Status: AC | PRN
  Administered 2023-05-28: 6 mg via INTRA_ARTICULAR

## 2023-05-28 MED ORDER — LIDOCAINE HCL 1 % IJ SOLN
0.5000 mL | INTRAMUSCULAR | Status: AC | PRN
Start: 2023-05-28 — End: 2023-05-28
  Administered 2023-05-28: .5 mL

## 2023-05-28 MED ORDER — LIDOCAINE HCL 1 % IJ SOLN
0.5000 mL | INTRAMUSCULAR | Status: AC | PRN
  Administered 2023-05-28: .5 mL

## 2023-05-28 NOTE — Progress Notes (Signed)
 [name] - 68 y.o. female MRN [id number]  Date of birth: 1955-09-27  Office Visit Note: Visit Date: 05/28/2023 PCP: Bennie Pierini, FNP Referred by: Daphine Deutscher, [name], *  Subjective: Chief Complaint  Patient presents with   Left Hand - Pain   Right Hand - Pain   HPI: [name] "[name]" is a pleasant 68 y.o. female who presents today for thumb pain at the [facility] joint with known arthritis.  She has tolerated and responded well to [facility] joint injections in the past, the last for back in October 2024 which gave her good relief until the last few weeks.  Her left thumb is worse than her right thumb.  Her right thumb only bothers her when she is actively dealing dexterity based activity.  She does wear her [facility] cool comfort braces on both sides which are helpful.  He does take Celebrex 200 mg daily as needed, she is in need of a refill for this.  Pertinent ROS were reviewed with the patient and found to be negative unless otherwise specified above in HPI.   Assessment & Plan: Visit Diagnoses:  1. Bilateral thumb pain   2. Primary osteoarthritis of first carpometacarpal joint of right hand   3. Primary osteoarthritis of first carpometacarpal joint of left hand    Plan: impression is acute exacerbation of chronic bilateral thumb pain with known osteoarthritis at the [facility] joint.  Her left thumb has severe arthritic change of the [facility] joint, the right thumb has moderate OA.  Left is more bothersome than right, although she would like to proceed with [facility] joint injection into each thumb today.  We did perform this today, patient tolerated well.  She may use ice as well as Celebrex 200 mg once daily as needed for any postinjection pain.  She will continue in her [facility] cool comfort braces.  As long as she continues getting benefit from the injections, we can consider this at infrequent intervals.  We did discuss that with the severity of her arthritis on the left thumb, she could  be a candidate for [facility] arthroplasty but we will hold on this for now given her improvement.  She will follow-up with me as needed.  Follow-up: Return if symptoms worsen or fail to improve.   Meds & Orders: No orders of the defined types were placed in this encounter.   Orders Placed This Encounter  Procedures   Small Joint Inj   Small Joint Inj   US Guided Needle Placement - No Linked Charges     Procedures: Small Joint Inj: L thumb [facility]MC on 05/28/2023 1:44 PM Indications: pain Details: 25 G needle, radial approach Medications: 0.5 mL lidocaine 1 %; 6 mg betamethasone acetate-betamethasone sodium phosphate 6 (3-3) MG/ML Outcome: tolerated well, no immediate complications Procedure, treatment alternatives, risks and benefits explained, specific risks discussed. Consent was given by the patient. Immediately prior to procedure a time out was called to verify the correct patient, procedure, equipment, support staff and site/side marked as required. Patient was prepped and draped in the usual sterile fashion.    Small Joint Inj: R thumb CMC on 05/28/2023 1:45 PM Indications: pain Details: 25 G needle, radial approach Medications: 0.5 mL lidocaine 1 %; 6 mg betamethasone acetate-betamethasone sodium phosphate 6 (3-3) MG/ML Outcome: tolerated well, no immediate complications Procedure, treatment alternatives, risks and benefits explained, specific risks discussed. Consent was given by the patient. Immediately prior to procedure a time out was called to verify the correct patient, procedure,  equipment, support staff and site/side marked as required. Patient was prepped and draped in the usual sterile fashion.          Clinical History: No specialty comments available.  She reports that she has never smoked. She has never used smokeless tobacco.  Recent Labs    09/23/22 1516  HGBA1C 5.7*    Objective:    Physical Exam  Gen: Well-appearing, in no acute distress; non-toxic CV:  Well-perfused. Warm.  Resp: Breathing unlabored on room air; no wheezing. Psych: Fluid speech in conversation; appropriate affect; normal thought process  Ortho Exam - Bilateral thumbs: + Positive CMC grind test bilaterally although left greater than right.  The left [facility] joint has some bony bossing here.  No joint effusion or redness noted bilaterally.   Imaging: No results found.  Past Medical/Family/Surgical/Social History: Medications & Allergies reviewed per EMR, new medications updated. 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   Hypokalemia 11/25/2020   S/P laparoscopic fundoplication 10/31/2020   Status post total replacement of left hip 01/31/2020   Unilateral primary osteoarthritis, left hip 12/21/2019   S/P shoulder replacement, right 11/15/2019   Overweight (BMI 25.0-29.9) 04/15/2019   Diverticulosis 04/14/2019   Chronic diastolic CHF (congestive heart failure) (HCC) 04/14/2019   Unstable angina (HCC) 10/20/2017   Chronic pain 09/23/2016   Pre-diabetes 09/02/2016   Recurrent major depressive disorder, in partial remission (HCC) 07/14/2016   Seizures (HCC) 07/14/2016   GAD (generalized anxiety disorder) 07/14/2016   Insomnia 09/05/2014   Allergic rhinitis 09/05/2014   Cervical spondylosis without myelopathy 12/19/2013    Class: Chronic   Neural foraminal stenosis of cervical spine 12/19/2013   Cervical radiculitis 09/19/2013   Lumbosacral spondylosis without myelopathy 11/16/2012   Postlaminectomy syndrome, lumbar region 11/16/2012   Herpes simplex virus (HSV) infection 10/31/2008   Hyperlipidemia with target LDL less than 100 10/31/2008   Primary hypertension 10/31/2008   GERD 10/31/2008   SPINAL  STENOSIS OF LUMBAR REGION 10/31/2008   Myalgia 10/31/2008   Osteoporosis 10/31/2008   SPONDYLOLISTHESIS 10/31/2008   SYNCOPE 10/31/2008   Sleep apnea 10/31/2008   Past Medical History:  Diagnosis Date   Allergy    Anemia    Anginal pain (HCC)    last time    Anxiety    Arthritis    RHEUMATOID   Asthma    Bipolar 1 disorder (HCC)    Blood transfusion without reported diagnosis    x 3   Cataracts, bilateral 07/2017   CHF (congestive heart failure) (HCC)    COPD (chronic obstructive pulmonary disease) (HCC)    Coronary artery disease    reported hx of "MI";  Echo 2009 with normal LVF;  Myoview 05/2011: no ischemia   Depression    Diabetes mellitus without complication (HCC)    Dyslipidemia    Dysrhythmia    SVT   Elevated liver enzymes 06/30/2022   Esophageal stricture    Fibromyalgia    GERD (gastroesophageal reflux disease)    H/O hiatal hernia    Head injury, unspecified    Headache    migraines   Herpes simplex infection    History of kidney stones    History of loop recorder 08/10/2017   Managed by Dr. Sharrell Ku  Hyperlipidemia    Hypertension    Insomnia    Myocardial infarction [facility])    age 53   Osteoporosis    Pneumonia    hx   Seizures (HCC)    none in the last 3-4 years as of 11/03/22 per patient   Shortness of breath    Sleep apnea    mild, does not require positive pressure treatment. (02/03/22)   Spinal stenosis of lumbar region    Spondylolisthesis    Status post placement of implantable loop recorder    Supraventricular tachycardia (HCC)    Syncope and collapse    s/p ILR; no arhythmogenic cause identified   UTI (lower urinary tract infection)    Family History  Problem Relation Age of Onset   Heart attack Father    Mental illness Father    Mental illness Mother    Heart attack Brother        stents   Alcohol abuse Brother    Heart disease Brother    Drug abuse Brother    Diabetes Brother    Colon cancer Maternal Aunt    Cirrhosis  Brother    Stomach cancer Neg Hx    Esophageal cancer Neg Hx    Rectal cancer Neg Hx    Past Surgical History:  Procedure Laterality Date   BACK SURGERY     BREAST EXCISIONAL BIOPSY Right    1990s   BREAST SURGERY     lumpectomy   CARDIAC CATHETERIZATION  10/06/2011   CATARACT EXTRACTION W/PHACO Right 07/31/2017   Procedure: CATARACT EXTRACTION PHACO AND INTRAOCULAR LENS PLACEMENT (IOC);  Surgeon: Fabio Pierce, MD;  Location: [facility];  Service: Ophthalmology;  Laterality: Right;  CDE: 2.33   CATARACT EXTRACTION W/PHACO Left 08/14/2017   Procedure: CATARACT EXTRACTION PHACO AND INTRAOCULAR LENS PLACEMENT (IOC);  Surgeon: Fabio Pierce, MD;  Location: [facility];  Service: Ophthalmology;  Laterality: Left;  CDE: 2.74   CHOLECYSTECTOMY     CYSTOSCOPY     stone   DIAGNOSTIC LAPAROSCOPY     laparoscopic cholecystectomy   DOPPLER ECHOCARDIOGRAPHY  2009   ESOPHAGOGASTRODUODENOSCOPY N/A 10/31/2020   Procedure: ESOPHAGOGASTRODUODENOSCOPY (EGD);  Surgeon: Gaynelle Adu, MD;  Location: [facility];  Service: General;  Laterality: N/A;   EYE SURGERY Bilateral 08/14/2017   cataract removal   GLUTEUS MINIMUS REPAIR Left 11/04/2022   Procedure: LEFT GLUTEUS MEDIUS REPAIR;  Surgeon: Huel Cote, MD;  Location: [facility] OR;  Service: Orthopedics;  Laterality: Left;   head up tilt table testing  06/15/2007   [name]   HEMORRHOID SURGERY     HERNIA REPAIR     insertion of implatable loop recorder  08/11/2007   [name]   POSTERIOR CERVICAL FUSION/FORAMINOTOMY N/A 12/19/2013   Procedure: RIGHT C3-4.C4-5 AND C5-6 FORAMINOTOMIES;  Surgeon: Kerrin Champagne, MD;  Location: [facility] OR;  Service: Orthopedics;  Laterality: N/A;   TOTAL HIP ARTHROPLASTY Left 01/31/2020   Procedure: LEFT TOTAL HIP ARTHROPLASTY ANTERIOR APPROACH;  Surgeon: Kathryne Hitch, MD;  Location: [facility] OR;  Service: Orthopedics;  Laterality: Left;   TOTAL SHOULDER ARTHROPLASTY Right 11/15/2019   Procedure: RIGHT TOTAL SHOULDER  ARTHROPLASTY;  Surgeon: Cammy Copa, MD;  Location: [facility];  Service: Orthopedics;  Laterality: Right;   TOTAL SHOULDER REVISION Right 06/11/2021   Procedure: RIGHT SHOULDER CONVERSION TOTAL SHOULDER ARTHROPLASTY to REVERSE TOTAL SHOULDER ARTHROPLASTY;  Surgeon: Cammy Copa, MD;  Location: [facility] OR;  Service: Orthopedics;  Laterality: Right;   TUBAL LIGATION  UPPER GASTROINTESTINAL ENDOSCOPY     XI ROBOTIC ASSISTED HIATAL HERNIA REPAIR N/A 10/31/2020   Procedure: XI ROBOTIC ASSISTED HIATAL HERNIA REPAIR WITH PARTIAL FUNDOPLICATION;  Surgeon: Gaynelle Adu, MD;  Location: [facility];  Service: General;  Laterality: N/A;   Social History   Occupational History   Occupation: Disability    Comment: 15 years  Tobacco Use   Smoking status: Never   Smokeless tobacco: Never  Vaping Use   Vaping status: Never Used  Substance and Sexual Activity   Alcohol use: No    Alcohol/week: 0.0 standard drinks of alcohol   Drug use: No   Sexual activity: Yes    Birth control/protection: Post-menopausal

## 2023-05-28 NOTE — Progress Notes (Signed)
 Patient says that both of her thumbs have been bothering her again for about 4 weeks now. She says that the left is worse than right but she would take injections in both today if that is possible. She has not had any new injuries, and her pain feels the same as it did prior to the last injections.

## 2023-05-29 ENCOUNTER — Other Ambulatory Visit: Payer: Self-pay

## 2023-05-29 ENCOUNTER — Encounter: Payer: Self-pay | Admitting: Nurse Practitioner

## 2023-05-29 ENCOUNTER — Telehealth: Payer: Self-pay | Admitting: *Deleted

## 2023-05-29 ENCOUNTER — Emergency Department (HOSPITAL_COMMUNITY): Payer: 59

## 2023-05-29 ENCOUNTER — Ambulatory Visit: Payer: 59 | Attending: Nurse Practitioner | Admitting: Nurse Practitioner

## 2023-05-29 ENCOUNTER — Emergency Department (HOSPITAL_COMMUNITY)
Admission: EM | Admit: 2023-05-29 | Discharge: 2023-05-29 | Disposition: A | Discharge status: Home / Self Care | Payer: 59 | Attending: Emergency Medicine | Admitting: Emergency Medicine

## 2023-05-29 ENCOUNTER — Encounter: Payer: Self-pay | Admitting: Sports Medicine

## 2023-05-29 ENCOUNTER — Encounter (HOSPITAL_COMMUNITY): Payer: Self-pay

## 2023-05-29 VITALS — BP 120/76 | HR 74 | Ht 62.0 in | Wt 123.0 lb

## 2023-05-29 DIAGNOSIS — R55 Syncope and collapse: Secondary | ICD-10-CM | POA: Diagnosis not present

## 2023-05-29 DIAGNOSIS — R0789 Other chest pain: Secondary | ICD-10-CM | POA: Diagnosis not present

## 2023-05-29 DIAGNOSIS — Z7982 Long term (current) use of aspirin: Secondary | ICD-10-CM | POA: Diagnosis not present

## 2023-05-29 DIAGNOSIS — R072 Precordial pain: Secondary | ICD-10-CM | POA: Insufficient documentation

## 2023-05-29 DIAGNOSIS — Z96611 Presence of right artificial shoulder joint: Secondary | ICD-10-CM | POA: Diagnosis not present

## 2023-05-29 DIAGNOSIS — E785 Hyperlipidemia, unspecified: Secondary | ICD-10-CM

## 2023-05-29 DIAGNOSIS — G909 Disorder of the autonomic nervous system, unspecified: Secondary | ICD-10-CM

## 2023-05-29 DIAGNOSIS — R079 Chest pain, unspecified: Secondary | ICD-10-CM

## 2023-05-29 DIAGNOSIS — K449 Diaphragmatic hernia without obstruction or gangrene: Secondary | ICD-10-CM | POA: Diagnosis not present

## 2023-05-29 DIAGNOSIS — I10 Essential (primary) hypertension: Secondary | ICD-10-CM

## 2023-05-29 LAB — BASIC METABOLIC PANEL
Anion gap: 13 (ref 5–15)
BUN: 19 mg/dL (ref 8–23)
CO2: 22 mmol/L (ref 22–32)
Calcium: 9.9 mg/dL (ref 8.9–10.3)
Chloride: 104 mmol/L (ref 98–111)
Creatinine, Ser: 0.75 mg/dL (ref 0.44–1.00)
GFR, Estimated: >60 mL/min (ref >60)
Glucose, Bld: 129 mg/dL — ABNORMAL HIGH (ref 70–99)
Potassium: 4.4 mmol/L (ref 3.5–5.1)
Sodium: 139 mmol/L (ref 135–145)

## 2023-05-29 LAB — CBC
HCT: 45.9 % (ref 36.0–46.0)
Hemoglobin: 14.6 g/dL (ref 12.0–15.0)
MCH: 31.2 pg (ref 26.0–34.0)
MCHC: 31.8 g/dL (ref 30.0–36.0)
MCV: 98.1 fL (ref 80.0–100.0)
Platelets: 214 10*3/uL (ref 150–400)
RBC: 4.68 MIL/uL (ref 3.87–5.11)
RDW: 12.6 % (ref 11.5–15.5)
WBC: 15.6 10*3/uL — ABNORMAL HIGH (ref 4.0–10.5)
nRBC: 0 % (ref 0.0–0.2)

## 2023-05-29 LAB — TROPONIN I (HIGH SENSITIVITY)
Troponin I (High Sensitivity): 6 ng/L (ref <18)
Troponin I (High Sensitivity): 6 ng/L (ref <18)

## 2023-05-29 MED ORDER — IOHEXOL 350 MG/ML SOLN
75.0000 mL | Freq: Once | INTRAVENOUS | Status: AC | PRN
  Administered 2023-05-29: 75 mL via INTRAVENOUS

## 2023-05-29 MED ORDER — ALUM & MAG HYDROXIDE-SIMETH 200-200-20 MG/5ML PO SUSP
30.0000 mL | Freq: Once | ORAL | Status: AC
  Administered 2023-05-29: 30 mL via ORAL
  Filled 2023-05-29: qty 30

## 2023-05-29 MED ORDER — IOHEXOL 300 MG/ML  SOLN
75.0000 mL | Freq: Once | INTRAMUSCULAR | Status: DC | PRN
Start: 2023-05-29 — End: 2023-05-29

## 2023-05-29 MED ORDER — LIDOCAINE VISCOUS HCL 2 % MT SOLN
15.0000 mL | Freq: Once | OROMUCOSAL | Status: DC
  Filled 2023-05-29: qty 15

## 2023-05-29 NOTE — Patient Instructions (Signed)
Medication Instructions:  Continue all current medications.  Labwork: none  Testing/Procedures: none  Follow-Up: 2 weeks   Any Other Special Instructions Will Be Listed Below (If Applicable).  If you need a refill on your cardiac medications before your next appointment, please call your pharmacy.  

## 2023-05-29 NOTE — Progress Notes (Addendum)
 Cardiology Office Note:  .   Date:  05/29/2023  ID:  JENAN ELLEGOOD, DOB 1955-06-26, MRN [id number] PCP: Bennie Pierini, FNP  [facility] Providers Cardiologist:  Dina Rich, MD    History of Present Illness: .   [name] is a 68 y.o. female with a past medical history of recurrent syncope, autonomic dysfunction, hypertension, chest pain, hyperlipidemia, history of COVID-19 in December 2024, asthma, bipolar 1 disorder, COPD, who presents today for scheduled follow-up.  Last seen by Dr. Dina Rich on April 21, 2023.  Overall was doing well.  It was noted she had chronic stable and infrequent symptoms of chest pain.  Past stress test were negative.  NST in 2019 is negative.  Cardiac catheterization 2013 showed patent vessels.  Symptoms did improve in the past with Imdur.  Today she presents for chest pain evaluation.  She states her chest pain started this past Sunday and has been getting worse recently.  She admits to constant chest pain/pressure on left lower side of her chest, underneath her left breast tissue.  It is made worse through exertion.  At rest, her pain is rated at a 4-5 on 0-10 pain scale, 7 out of 10 currently.  She says she is even noticing this while talking to me during her interview.  She says she believes something is out of the ordinary as this feels different from her previous episodes of chest pain.  The only time she does not have chest pain is when she is sleeping at night.  Denies any aggravating factors or worsening symptoms through position changes.  She says nitroglycerin helps. Denies any shortness of breath, palpitations, syncope, presyncope, dizziness, orthopnea, PND, swelling or significant weight changes, acute bleeding, or claudication.  Denies any recent illness or sick contacts.  ROS: Negative.  See HPI.  Studies Reviewed: [name]    EKG: EKG Interpretation Date/Time:  Friday May 29 2023 14:32:51 EST Ventricular Rate:   76 PR Interval:  184 QRS Duration:  100 QT Interval:  404 QTC Calculation: 454 R Axis:   -39  Text Interpretation: Normal sinus rhythm Left axis deviation Moderate voltage criteria for LVH, may be normal variant ( R in aVL , Cornell product ) Septal infarct , age undetermined When compared with ECG of 25-Nov-2020 20:02, PREVIOUS ECG IS PRESENT Confirmed by Sharlene Dory [id number]) on 05/29/2023 2:57:53 PM   Echocardiogram 10/2020: 1. Left ventricular ejection fraction, by estimation, is 60 to 65%. The  left ventricle has normal function. The left ventricle has no regional  wall motion abnormalities. Left ventricular diastolic parameters are  indeterminate.   2. Right ventricular systolic function is normal. The right ventricular  size is normal. Tricuspid regurgitation signal is inadequate for assessing  PA pressure.   3. The mitral valve is grossly normal. Mild mitral valve regurgitation.   4. The aortic valve is tricuspid. There is mild calcification of the  aortic valve. Aortic valve regurgitation is mild. No aortic stenosis is  present.   5. The inferior vena cava is normal in size with greater than 50%  respiratory variability, suggesting right atrial pressure of 3 mmHg.   Comparison(s): Prior images reviewed side by side. Aortic regurgitation is overall mild.   Lexiscan 09/2017: There was no ST segment deviation noted during stress. Nonspecific T wave flattening in aVL and V2 seen throughout study. Defect 1: There is a medium defect of mild severity present in the mid inferior, apical inferior and apical lateral location. This appears  to be due to soft tissue attenuation given normal regional wall motion. No ischemic territories. This is a low risk study. Nuclear stress EF: 65%.  Physical Exam:   VS:  BP 120/76 (BP Location: Right Arm, Patient Position: Supine, Cuff Size: Normal)   Pulse 74   Ht 5\' 2"  (1.575 m)   Wt 123 lb (55.8 kg)   SpO2 96%   BMI 22.50 kg/m    Wt Readings  from Last 3 Encounters:  05/29/23 123 lb (55.8 kg)  04/21/23 122 lb 12.8 oz (55.7 kg)  03/19/23 121 lb (54.9 kg)    GEN: Well nourished, well developed 68 year old female, appears anxious and in pain NECK: No JVD; No carotid bruits CARDIAC: S1/S2, RRR, no murmurs, rubs, gallops RESPIRATORY:  Clear to auscultation without rales, wheezing or rhonchi  ABDOMEN: Soft, non-tender, non-distended EXTREMITIES:  No edema; No deformity   ASSESSMENT AND PLAN: .    Chest pain of uncertain etiology Etiology unclear.  Lexiscan in 2019 low risk.  EKG reassuring today.  Pt appears uncomfortable and in pain.  Discussed outpatient versus inpatient treatment.  Highly recommended ED evaluation due to active chest pain (7/10) and current presentation along with signs and symptoms, recommended to be seen at [facility] for further evaluation to r/o for ACS. Strongly recommended EMS to transport patient, however she declined and stated grandson who is present in the room will drive her to [facility].  No medication changes at this time. Discussed about NTG PRN. She verbalized understanding. I have contacted and notified [facility] ED physician on call and MD verbalized understanding. Also have sent a message to Dr. Wyline Mood via Epic so he is aware.   Recurrent syncope/Autonomic dysfunction Denies any symptoms.  Continue current medication regimen.  Sending patient to ED for chest pain evaluation as noted above.  HTN Blood pressure is stable.  No medication changes at this time.  Sending patient to ED for chest pain evaluation as noted above.  HLD LDL 64 in September 24.  Continue rosuvastatin.  Dispo: Follow-up with me/[facility] in 1 to 2 weeks after hospital discharge.  Signed, Sharlene Dory, NP

## 2023-05-29 NOTE — ED Triage Notes (Signed)
 Pt stated that she has been having chest pain since Monday, has taken nitroglycerin with little relief. No radiation, sweating or n/v. Pt states she has a lot of pressure. Hurts worse with exertion and stress.

## 2023-05-29 NOTE — Telephone Encounter (Signed)
 Call placed to patient as she sent mychart message stating that she is waiting for Sharlene Dory, NP to send over orders for her.  Explained to patient that provider sent her to the ED to be evaluated for the active chest pain that she was having in the office.  The doctors there in the ED will decide what testing / orders that you will need during your time there.  She verbalized understanding.

## 2023-05-29 NOTE — ED Provider Notes (Signed)
 [place] EMERGENCY DEPARTMENT AT [facility] Provider Note   CSN: [id number] Arrival date & time: 05/29/23  1600     History  Chief Complaint  Patient presents with   Chest Pain    [name] is a 68 y.o. female.  She is here for evaluation of chest pain.  She said it started about 5 days ago across her chest and has not let up.  She said she gets this at times, because that her angina and its usually responsive to nitro.  She has tried nitro but it only transiently helped.  She went to her cardiologist today who sent her here for further evaluation.  She is not having any diaphoresis nausea vomiting shortness of breath syncope.  She had a low risk Lexiscan in 2019.  The history is provided by the patient.  Chest Pain Pain location:  Substernal area Pain quality: aching   Pain severity:  Moderate Onset quality:  Gradual Duration:  5 days Timing:  Constant Progression:  Unchanged Chronicity:  Recurrent Relieved by:  Nothing Ineffective treatments:  Nitroglycerin Associated symptoms: no abdominal pain, no cough, no diaphoresis, no fever, no nausea, no shortness of breath and no vomiting        Home Medications Prior to Admission medications   Medication Sig Start Date End Date Taking? Authorizing Provider  Accu-Chek Softclix Lancets lancets test blood sugar up to 4 times a day as directed. Dx R73.03 03/02/23   Bennie Pierini, FNP  acyclovir ointment (ZOVIRAX) 5 % apply 1 application topically every 3 (three) hours as needed (outbreaks). 05/22/23   Daphine Deutscher [name], FNP  albuterol (VENTOLIN HFA) 108 (90 Base) MCG/ACT inhaler inhale 2 puffs every 6 hours as needed for shortness of breath and wheezing. 04/16/23   Daphine Deutscher [name], FNP  Alcohol Swabs (B-D SINGLE USE SWABS REGULAR) PADS USE 1 PAD DAILY WHEN CHECKING BLOOD SUGAR. R73.03 06/26/22   Daphine Deutscher, [name], FNP  aspirin EC 325 MG tablet Take 1 tablet (325 mg total) by mouth daily. 09/18/22    Huel Cote, MD  benzonatate (TESSALON) 200 MG capsule Take 1 capsule (200 mg total) by mouth 3 (three) times daily as needed. 03/30/23   Junie Spencer, FNP  blood glucose meter kit and supplies Dispense based on patient and insurance preference. Use up to four times daily as directed. (FOR ICD-10 E10.9, E11.9). 02/04/21   Daphine Deutscher, [name], FNP  busPIRone (BUSPAR) 10 MG tablet Take 1 tablet (10 mg total) by mouth 2 (two) times daily as needed. 12/22/22   Bennie Pierini, FNP  butalbital-acetaminophen-caffeine (FIORICET) [phone] MG tablet take 1 tablet by mouth every 6 (six) hours as needed for headache. 05/22/23   Daphine Deutscher, [name], FNP  carvedilol (COREG) 3.125 MG tablet TAKE 1/2 (0.5) TABLET TWICE A DAY WITH A MEAL; BLOOD PRESSURE OVER 150 BEFORE TAKING. 12/22/22   Daphine Deutscher, [name], FNP  celecoxib (CELEBREX) 200 MG capsule Take 1 capsule (200 mg total) by mouth daily as needed. 05/28/23   Madelyn Brunner, DO  dexlansoprazole (DEXILANT) 60 MG capsule Take 1 capsule (60 mg total) by mouth daily. 12/22/22   Daphine Deutscher, [name], FNP  Dextromethorphan-GG-APAP (CORICIDIN HBP COLD/COUGH/FLU) [phone] MG/15ML LIQD Take 30 mLs by mouth every 4 (four) hours as needed (cough). 02/03/23   Milian, Aleen Campi, FNP  diclofenac Sodium (VOLTAREN) 1 % GEL APPLY 4 GRAMS TOPICALLY 4 TIMES A DAY. Patient taking differently: Apply 4 g topically 4 (four) times daily as needed (pain). 03/01/21   Bennie Pierini, FNP  DULoxetine (  CYMBALTA) 60 MG capsule Take 1 capsule (60 mg total) by mouth at bedtime. 12/22/22   Daphine Deutscher, [name], FNP  empagliflozin (JARDIANCE) 25 MG TABS tablet Take 1 tablet (25 mg total) by mouth daily. 12/22/22   Daphine Deutscher, [name], FNP  escitalopram (LEXAPRO) 20 MG tablet Take 1 tablet (20 mg total) by mouth daily. 12/22/22   Daphine Deutscher, [name], FNP  estradiol (ESTRACE) 0.1 MG/GM vaginal cream Place 1 Applicatorful vaginally 3 (three) times a week. 03/20/23    Daphine Deutscher, [name], FNP  FIBER PO Take 1 tablet by mouth in the morning and at bedtime.    [provider]  fludrocortisone (FLORINEF) 0.1 MG tablet take 1 tablet ( total) by mouth every other day. 05/22/23   Daphine Deutscher, [name], FNP  fluticasone (FLONASE) 50 MCG/ACT nasal spray USE 1 SPRAY IN EACH NOSTRIL ONCE DAILY. Patient taking differently: Place 1 spray into both nostrils daily as needed for allergies. 04/04/20   Daphine Deutscher, [name], FNP  fluticasone furoate-vilanterol (BREO ELLIPTA) 100-25 MCG/ACT AEPB Inhale 1 puff by mouth once daily 04/22/23   Bennie Pierini, FNP  furosemide (LASIX) 40 MG tablet take 1 tablet daily as needed for excessive fluid. 04/16/23   Daphine Deutscher, [name], FNP  Glucagon, rDNA, (GLUCAGON EMERGENCY) 1 MG KIT Inject 1 mg into the skin as needed. 06/26/22   Daphine Deutscher, [name], FNP  glucose blood (ACCU-CHEK GUIDE) test strip TEST BLOOD SUGAR UP TO 4 TIMES A DAY AS DIRECTED. R73.03 09/24/22   Daphine Deutscher, [name], FNP  hydrALAZINE (APRESOLINE) 25 MG tablet take 1 tablet by mouth 3 times daily as needed (for severe hypertension/systolic number 170 or greater). 04/15/23   Antoine Poche, MD  HYDROcodone-acetaminophen (NORCO) 7.5-325 MG tablet Take 1 tablet by mouth in the morning, at noon, and at bedtime. 05/20/23 06/19/23  Daphine Deutscher, [name], FNP  ipratropium (ATROVENT) 0.02 % nebulizer solution USE 1 VIAL ( ) IN NEBULIZER EVERY 6 HOURS AS NEEDED FOR WHEEZING OR SHORTNESS OF BREATH. 06/28/20   Daphine Deutscher, [name], FNP  isosorbide mononitrate (IMDUR) 60 MG 24 hr tablet Take 1 tablet (60 mg total) by mouth 2 (two) times daily. 04/21/23   Antoine Poche, MD  lamoTRIgine (LAMICTAL) 150 MG tablet Take 1 tablet (150 mg total) by mouth daily. 03/19/23   Daphine Deutscher, [name], FNP  levETIRAcetam (KEPPRA) 500 MG tablet Take 1 tablet (500 mg total) by mouth 2 (two) times daily. 12/22/22   Daphine Deutscher [name], FNP  levocetirizine (XYZAL) 5 MG tablet  take 1 tablet by mouth every morning. 03/26/23   Daphine Deutscher, [name], FNP  lidocaine (LIDODERM) 5 % place 1 patch onto the skin daily. remove & discard patch within 12 hours or as directed by md 05/25/23   Daphine Deutscher, [name], FNP  lidocaine (XYLOCAINE) 5 % ointment apply to affected area 3 times a day as needed for mild or moderate pain. 04/16/23   Daphine Deutscher, [name], FNP  methocarbamol (ROBAXIN) 500 MG tablet take 1 tablet (500 MILLIGRAM total) by mouth 4 (four) times daily. 04/24/23   Daphine Deutscher, [name], FNP  nitroGLYCERIN (NITROSTAT) 0.4 MG SL tablet place one (1) tablet under tongue every 5 minutes up to (3) doses as needed for chest pain. 05/25/23   Daphine Deutscher [name], FNP  ondansetron (ZOFRAN) 4 MG tablet take 1 tablet by mouth every 8 hours as needed for nausea and vomiting. 05/22/23   Daphine Deutscher, [name], FNP  oxybutynin (DITROPAN-XL) 5 MG 24 hr tablet Take 1 tablet (5 mg total) by mouth at bedtime. 11/26/21   Daphine Deutscher [name], FNP  Probiotic Product (PROBIOTIC PO) Take 1 capsule  by mouth in the morning.    [provider]  promethazine-dextromethorphan (PROMETHAZINE-DM) 6.25-15 MG/5ML syrup Take 5 mLs by mouth 3 (three) times daily as needed for cough. 04/03/23   Junie Spencer, FNP  rosuvastatin (CRESTOR) 10 MG tablet Take 1 tablet (10 mg total) by mouth daily. 12/22/22   Daphine Deutscher [name], FNP  traZODone (DESYREL) 150 MG tablet Take 1 tablet (150 mg total) by mouth at bedtime. 12/22/22   Daphine Deutscher, [name], FNP  valACYclovir (VALTREX) 1000 MG tablet Take 1 tablet (1,000 mg total) by mouth 3 (three) times daily as needed (with active outbreaks.). 03/19/23   Daphine Deutscher, [name], FNP  valACYclovir (VALTREX) 500 MG tablet TAKE (1) TABLET TWICE DAILY. 03/19/23   Daphine Deutscher [name], FNP      Allergies    Codeine, Morphine and codeine, Ambien [zolpidem tartrate], Clonidine derivatives, Metformin and related, Lyrica [pregabalin], and Neurontin [gabapentin]     Review of Systems   Review of Systems  Constitutional:  Negative for diaphoresis and fever.  Respiratory:  Negative for cough and shortness of breath.   Cardiovascular:  Positive for chest pain.  Gastrointestinal:  Negative for abdominal pain, nausea and vomiting.    Physical Exam Updated Vital Signs BP (!) 142/85   Pulse 79   Temp 99.4 F (37.4 C) (Oral)   Ht 5\' 2"  (1.575 m)   Wt 55.8 kg   SpO2 99%   BMI 22.50 kg/m  Physical Exam Vitals and nursing note reviewed.  Constitutional:      General: She is not in acute distress.    Appearance: She is well-developed.  HENT:     Head: Normocephalic and atraumatic.  Eyes:     Conjunctiva/sclera: Conjunctivae normal.  Cardiovascular:     Rate and Rhythm: Normal rate and regular rhythm.     Heart sounds: Normal heart sounds. No murmur heard. Pulmonary:     Effort: Pulmonary effort is normal. No respiratory distress.     Breath sounds: Normal breath sounds.  Abdominal:     Palpations: Abdomen is soft.     Tenderness: There is no abdominal tenderness.  Musculoskeletal:        General: No swelling. Normal range of motion.     Cervical back: Neck supple.     Right lower leg: No tenderness. No edema.     Left lower leg: No tenderness. No edema.  Skin:    General: Skin is warm and dry.     Capillary Refill: Capillary refill takes less than 2 seconds.  Neurological:     General: No focal deficit present.     Mental Status: She is alert.     ED Results / Procedures / Treatments   Labs (all labs ordered are listed, but only abnormal results are displayed) Labs Reviewed  BASIC METABOLIC PANEL - Abnormal; Notable for the following components:      Result Value   Glucose, Bld 129 (*)    All other components within normal limits  CBC - Abnormal; Notable for the following components:   WBC 15.6 (*)    All other components within normal limits  TROPONIN I (HIGH SENSITIVITY)  TROPONIN I (HIGH SENSITIVITY)    EKG EKG  Interpretation Date/Time:  Friday May 29 2023 19:24:15 EST Ventricular Rate:  51 PR Interval:  185 QRS Duration:  96 QT Interval:  474 QTC Calculation: 437 R Axis:   -19  Text Interpretation: Sinus rhythm Abnormal R-wave progression, early transition Left ventricular hypertrophy No significant change since last tracing  Confirmed by Meridee Score [phone]) on 05/29/2023 7:29:24 PM Also confirmed by Meridee Score [phone]), editor Illene Labrador [phone]  on 05/30/2023 8:19:48 AM  Radiology DG Chest 2 View Result Date: 05/29/2023 CLINICAL DATA:  chest pain  Cp x 4 days, hx htn. EXAM: CHEST - 2 VIEW COMPARISON:  Chest x-ray 02/03/2023 FINDINGS: Wireless cardiac device overlies left chest. The heart and mediastinal contours are within normal limits. No focal consolidation. No pulmonary edema. No pleural effusion. No pneumothorax. No acute osseous abnormality. Total right shoulder reverse arthroplasty. Right upper quadrant surgical clips. IMPRESSION: No active cardiopulmonary disease. Electronically Signed   By: Tish Frederickson M.D.   On: 05/29/2023 17:03    Procedures Procedures    Medications Ordered in ED Medications  iohexol (OMNIPAQUE) 350 MG/ML injection 75 mL (75 mLs Intravenous Contrast Given 05/29/23 1957)  alum & mag hydroxide-simeth (MAALOX/MYLANTA) 200-200-20 MG/5ML suspension 30 mL (30 mLs Oral Given 05/29/23 2051)    ED Course/ Medical Decision Making/ A&P Clinical Course as of 05/30/23 1052  Fri May 29, 2023  2120 Patient's cardiac enzymes are negative and her chest angio does not show any obvious explanation for her symptoms.  I reviewed this with her.  She is comfortable plan for discharge and outpatient follow-up with her cardiologist. [MB]    Clinical Course User Index [MB] Terrilee Files, MD                                 Medical Decision Making Amount and/or Complexity of Data Reviewed Labs: ordered. Radiology: ordered.  Risk OTC drugs. Prescription drug  management.   This patient complains of chest pain; this involves an extensive number of treatment Options and is a complaint that carries with it a high risk of complications and morbidity. The differential includes ACS, pneumonia, pneumothorax, PE, reflux, musculoskeletal  I ordered, reviewed and interpreted labs, which included CBC with elevated white count, chemistries elevated glucose, troponins flat I ordered medication Maalox and reviewed PMP when indicated. I ordered imaging studies which included chest x-ray CT angio and I independently    visualized and interpreted imaging which showed no acute findings Previous records obtained and reviewed in epic including cardiology note from today Cardiac monitoring reviewed, sinus rhythm Social determinants considered, no significant barriers Critical Interventions: None  After the interventions stated above, I reevaluated the patient and found patient to be hemodynamically stable in no distress Admission and further testing considered, no indications for admission but will need close cardiology follow-up.  Recommended trial of pain medication such as Tylenol ibuprofen.  Close cardiology follow-up.  Return instructions discussed         Final Clinical Impression(s) / ED Diagnoses Final diagnoses:  Nonspecific chest pain    Rx / DC Orders ED Discharge Orders     None         Terrilee Files, MD 05/30/23 1054

## 2023-06-02 ENCOUNTER — Encounter: Payer: Self-pay | Admitting: Nurse Practitioner

## 2023-06-02 ENCOUNTER — Telehealth: Payer: Self-pay | Admitting: Nurse Practitioner

## 2023-06-02 ENCOUNTER — Ambulatory Visit: Attending: Nurse Practitioner | Admitting: Nurse Practitioner

## 2023-06-02 VITALS — BP 120/76 | HR 94 | Ht 61.0 in | Wt 126.6 lb

## 2023-06-02 DIAGNOSIS — I10 Essential (primary) hypertension: Secondary | ICD-10-CM | POA: Diagnosis not present

## 2023-06-02 DIAGNOSIS — R002 Palpitations: Secondary | ICD-10-CM

## 2023-06-02 DIAGNOSIS — G909 Disorder of the autonomic nervous system, unspecified: Secondary | ICD-10-CM | POA: Diagnosis not present

## 2023-06-02 DIAGNOSIS — R079 Chest pain, unspecified: Secondary | ICD-10-CM | POA: Diagnosis not present

## 2023-06-02 DIAGNOSIS — Z87898 Personal history of other specified conditions: Secondary | ICD-10-CM

## 2023-06-02 DIAGNOSIS — E785 Hyperlipidemia, unspecified: Secondary | ICD-10-CM

## 2023-06-02 DIAGNOSIS — R748 Abnormal levels of other serum enzymes: Secondary | ICD-10-CM | POA: Diagnosis not present

## 2023-06-02 NOTE — Progress Notes (Signed)
 Cardiology Office Note:  .   Date:  06/02/2023  ID:  [name], DOB Dec 16, 1955, MRN [id number] PCP: Bennie Pierini, FNP  [facility] Providers Cardiologist:  Dina Rich, MD    History of Present Illness: .   [name] is a 68 y.o. female with a past medical history of recurrent syncope, autonomic dysfunction, hypertension, chest pain, hyperlipidemia, history of COVID-19 in December 2024, asthma, bipolar 1 disorder, COPD, who presents today for chest pain follow-up.   Last seen by Dr. Dina Rich on April 21, 2023.  Overall was doing well.  It was noted she had chronic stable and infrequent symptoms of chest pain.  Past stress test were negative.  NST in 2019 is negative.  Cardiac catheterization 2013 showed patent vessels.  Symptoms did improve in the past with Imdur.  05/29/2023 - Today she presents for chest pain evaluation.  She states her chest pain started this past Sunday and has been getting worse recently.  She admits to constant chest pain/pressure on left lower side of her chest, underneath her left breast tissue.  It is made worse through exertion.  At rest, her pain is rated at a 4-5 on 0-10 pain scale, 7 out of 10 currently.  She says she is even noticing this while talking to me during her interview.  She says she believes something is out of the ordinary as this feels different from her previous episodes of chest pain.  The only time she does not have chest pain is when she is sleeping at night.  Denies any aggravating factors or worsening symptoms through position changes.  She says nitroglycerin helps. Denies any shortness of breath, palpitations, syncope, presyncope, dizziness, orthopnea, PND, swelling or significant weight changes, acute bleeding, or claudication.  Denies any recent illness or sick contacts. She was sent to ED for further evaluation.   ED visit on 05/29/2023 for chest pain.  Workup was overall unremarkable.  Was recommended to  follow-up with PCP for further evaluation and outpatient cardiology.  06/02/2023 -today she presents for follow-up.  Continues to admit to similar chest pain from last office visit.  She denies any improvement in her symptoms.  Currently rates her chest pain as a 5/10 on the 0-10 pain scale.  Denies any progressive/worsening symptoms.  Wants to know she needs an echocardiogram performed. Denies any shortness of breath, syncope, presyncope, dizziness, orthopnea, PND, swelling or significant weight changes, acute bleeding, or claudication.  Does admit to some daily palpitations, says this is brief in duration and not her most bothersome symptom.  Denies any recent sick contacts, malaise, fever, chills.   ROS: Negative.  See HPI.  Studies Reviewed: [name]    EKG: EKG Interpretation Date/Time:  Tuesday June 02 2023 08:35:39 EST Ventricular Rate:  99 PR Interval:  174 QRS Duration:  92 QT Interval:  374 QTC Calculation: 479 R Axis:   -47  Text Interpretation: Normal sinus rhythm Left anterior fascicular block Left ventricular hypertrophy with repolarization abnormality ( R in aVL , Cornell product , Romhilt-Estes ) Cannot rule out Septal infarct , age undetermined When compared with ECG of 29-May-2023 19:24, PREVIOUS ECG IS PRESENT Confirmed by Sharlene Dory [phone]) on 06/02/2023 8:40:43 AM   Echocardiogram 10/2020: 1. Left ventricular ejection fraction, by estimation, is 60 to 65%. The  left ventricle has normal function. The left ventricle has no regional  wall motion abnormalities. Left ventricular diastolic parameters are  indeterminate.   2. Right ventricular systolic function is  normal. The right ventricular  size is normal. Tricuspid regurgitation signal is inadequate for assessing  PA pressure.   3. The mitral valve is grossly normal. Mild mitral valve regurgitation.   4. The aortic valve is tricuspid. There is mild calcification of the  aortic valve. Aortic valve regurgitation is mild. No  aortic stenosis is  present.   5. The inferior vena cava is normal in size with greater than 50%  respiratory variability, suggesting right atrial pressure of 3 mmHg.   Comparison(s): Prior images reviewed side by side. Aortic regurgitation is overall mild.   Lexiscan 09/2017: There was no ST segment deviation noted during stress. Nonspecific T wave flattening in aVL and V2 seen throughout study. Defect 1: There is a medium defect of mild severity present in the mid inferior, apical inferior and apical lateral location. This appears to be due to soft tissue attenuation given normal regional wall motion. No ischemic territories. This is a low risk study. Nuclear stress EF: 65%.  Physical Exam:   VS:  BP 120/76   Pulse 94   Ht 5\' 1"  (1.549 m)   Wt 126 lb 9.6 oz (57.4 kg)   SpO2 95%   BMI 23.92 kg/m    Wt Readings from Last 3 Encounters:  06/02/23 126 lb 9.6 oz (57.4 kg)  05/29/23 123 lb 0.3 oz (55.8 kg)  05/29/23 123 lb (55.8 kg)    GEN: Well nourished, well developed 68 year old female, appears in pain NECK: No JVD; No carotid bruits CARDIAC: S1/S2, RRR, regular fast rate that was brief in duration during auscultation, then resolved no murmurs, rubs, gallops RESPIRATORY:  Clear to auscultation without rales, wheezing or rhonchi  ABDOMEN: Soft, non-tender, non-distended EXTREMITIES:  No edema; No deformity   ASSESSMENT AND PLAN: .    Chest pain of uncertain etiology Etiology unclear.  Lexiscan in 2019 low risk.  EKG reassuring today.  Pt appears in pain.  Recent ED visit and was ruled out for ACS.  Discussed NST including risk and benefits, and she verbalized understanding is agreeable to proceed.  Will arrange.  Also will update echocardiogram at this time.  Discussed conservative measures.  No medication changes at this time. Discussed about NTG PRN. She verbalized understanding.  Recent CBC in the ED revealed elevated white blood cell count-unclear etiology.  See HPI.  Denies any  recent sick contacts, or flulike symptoms.  Will obtain ESR/CRP for further evaluation.  Care and ED precautions discussed.  Informed Consent   Shared Decision Making/Informed Consent The risks [chest pain, shortness of breath, cardiac arrhythmias, dizziness, blood pressure fluctuations, myocardial infarction, stroke/transient ischemic attack, nausea, vomiting, allergic reaction, radiation exposure, metallic taste sensation and life-threatening complications (estimated to be 1 in 10,000)], benefits (risk stratification, diagnosing coronary artery disease, treatment guidance) and alternatives of a nuclear stress test were discussed in detail with Ms. Amy and she agrees to proceed.    2. Hx of recurrent syncope/Autonomic dysfunction/ palpitations Denies any symptoms.  Continue current medication regimen.  It sounds like she went into what is felt to be nonsustained SVT briefly during auscultation, then self converted.  She does admit to daily palpitations that are brief in duration.  Will arrange 1 week ZIO XT monitor for further evaluation.  Care and ED precautions discussed.  HTN Blood pressure is stable.  No medication changes at this time.  Discussed to monitor BP at home at least 2 hours after medications and sitting for 5-10 minutes.  HLD LDL 64 in September  24.  Continue rosuvastatin.  5. Elevated liver enzymes Liver enzymes elevated in September 2024.  Unclear etiology.  Will repeat LFTs for further evaluation as may require needing to decrease dose of her cholesterol medication/possibly holding medication.  Continue follow-up with PCP as scheduled.  Dispo: Care and ED precautions discussed.  Follow-up with me/APP in 4-6 weeks after hospital discharge.  Signed, Sharlene Dory, NP

## 2023-06-02 NOTE — Patient Instructions (Signed)
 Medication Instructions:  Your physician recommends that you continue on your current medications as directed. Please refer to the Current Medication list given to you today.  Labwork: This week   Testing/Procedures: Your physician has requested that you have an echocardiogram. Echocardiography is a painless test that uses sound waves to create images of your heart. It provides your doctor with information about the size and shape of your heart and how well your heart's chambers and valves are working. This procedure takes approximately one hour. There are no restrictions for this procedure. Please do NOT wear cologne, perfume, aftershave, or lotions (deodorant is allowed). Please arrive 15 minutes prior to your appointment time.  Please note: We ask at that you not bring children with you during ultrasound (echo/ vascular) testing. Due to room size and safety concerns, children are not allowed in the ultrasound rooms during exams. Our front office staff cannot provide observation of children in our lobby area while testing is being conducted. An adult accompanying a patient to their appointment will only be allowed in the ultrasound room at the discretion of the ultrasound technician under special circumstances. We apologize for any inconvenience.  Follow-Up: Your physician recommends that you schedule a follow-up appointment in: 4-6 weeks   Any Other Special Instructions Will Be Listed Below (If Applicable).  If you need a refill on your cardiac medications before your next appointment, please call your pharmacy.

## 2023-06-02 NOTE — Telephone Encounter (Signed)
 Checking percert on the following patient for testing scheduled at [facility].    LEXISCAN    06/05/2023

## 2023-06-04 ENCOUNTER — Other Ambulatory Visit

## 2023-06-04 ENCOUNTER — Telehealth (HOSPITAL_COMMUNITY): Payer: Self-pay | Admitting: Surgery

## 2023-06-04 DIAGNOSIS — R079 Chest pain, unspecified: Secondary | ICD-10-CM | POA: Diagnosis not present

## 2023-06-04 DIAGNOSIS — R748 Abnormal levels of other serum enzymes: Secondary | ICD-10-CM | POA: Diagnosis not present

## 2023-06-05 ENCOUNTER — Emergency Department (HOSPITAL_COMMUNITY)

## 2023-06-05 ENCOUNTER — Emergency Department (HOSPITAL_COMMUNITY)
Admission: EM | Admit: 2023-06-05 | Discharge: 2023-06-05 | Disposition: A | Discharge status: Home / Self Care | Attending: Emergency Medicine | Admitting: Emergency Medicine

## 2023-06-05 ENCOUNTER — Encounter (HOSPITAL_COMMUNITY): Payer: Self-pay | Admitting: Emergency Medicine

## 2023-06-05 ENCOUNTER — Ambulatory Visit: Payer: Self-pay | Admitting: Nurse Practitioner

## 2023-06-05 ENCOUNTER — Encounter (HOSPITAL_COMMUNITY)
Admission: RE | Admit: 2023-06-05 | Discharge: 2023-06-05 | Disposition: A | Discharge status: Home / Self Care | Source: Ambulatory Visit | Attending: Nurse Practitioner | Admitting: Nurse Practitioner

## 2023-06-05 ENCOUNTER — Ambulatory Visit (HOSPITAL_COMMUNITY)
Admission: RE | Admit: 2023-06-05 | Discharge: 2023-06-05 | Disposition: A | Discharge status: Home / Self Care | Source: Ambulatory Visit | Attending: Cardiology | Admitting: Cardiology

## 2023-06-05 ENCOUNTER — Other Ambulatory Visit: Payer: Self-pay

## 2023-06-05 ENCOUNTER — Encounter (HOSPITAL_COMMUNITY)

## 2023-06-05 DIAGNOSIS — R0781 Pleurodynia: Secondary | ICD-10-CM | POA: Diagnosis not present

## 2023-06-05 DIAGNOSIS — Z7982 Long term (current) use of aspirin: Secondary | ICD-10-CM | POA: Diagnosis not present

## 2023-06-05 DIAGNOSIS — R079 Chest pain, unspecified: Secondary | ICD-10-CM | POA: Insufficient documentation

## 2023-06-05 DIAGNOSIS — I509 Heart failure, unspecified: Secondary | ICD-10-CM | POA: Insufficient documentation

## 2023-06-05 DIAGNOSIS — Z79899 Other long term (current) drug therapy: Secondary | ICD-10-CM | POA: Diagnosis not present

## 2023-06-05 DIAGNOSIS — I2510 Atherosclerotic heart disease of native coronary artery without angina pectoris: Secondary | ICD-10-CM | POA: Insufficient documentation

## 2023-06-05 DIAGNOSIS — W01198A Fall on same level from slipping, tripping and stumbling with subsequent striking against other object, initial encounter: Secondary | ICD-10-CM | POA: Diagnosis not present

## 2023-06-05 DIAGNOSIS — S20212A Contusion of left front wall of thorax, initial encounter: Secondary | ICD-10-CM | POA: Diagnosis not present

## 2023-06-05 DIAGNOSIS — R0789 Other chest pain: Secondary | ICD-10-CM | POA: Diagnosis present

## 2023-06-05 DIAGNOSIS — I110 Hypertensive heart disease with heart failure: Secondary | ICD-10-CM | POA: Diagnosis not present

## 2023-06-05 DIAGNOSIS — J449 Chronic obstructive pulmonary disease, unspecified: Secondary | ICD-10-CM | POA: Insufficient documentation

## 2023-06-05 LAB — NM MYOCAR MULTI W/SPECT W/WALL MOTION / EF
Estimated workload: 1
Exercise duration (min): 0 min
Exercise duration (sec): 0 s
LV dias vol: 54 mL (ref 46–106)
LV sys vol: 14 mL
MPHR: 154 {beats}/min
Nuc Stress EF: 74 %
Peak HR: 104 {beats}/min
Percent HR: 67 %
RATE: 0.3
Rest HR: 72 {beats}/min
Rest Nuclear Isotope Dose: 10.4 mCi
SDS: 0
SRS: 3
SSS: 3
Stress Nuclear Isotope Dose: 30.2 mCi
TID: 1.15

## 2023-06-05 LAB — HEPATIC FUNCTION PANEL
ALT: 15 IU/L (ref 0–32)
AST: 14 IU/L (ref 0–40)
Albumin: 4.3 g/dL (ref 3.9–4.9)
Alkaline Phosphatase: 99 IU/L (ref 44–121)
Bilirubin Total: 0.2 mg/dL (ref 0.0–1.2)
Bilirubin, Direct: 0.12 mg/dL (ref 0.00–0.40)
Total Protein: 6.7 g/dL (ref 6.0–8.5)

## 2023-06-05 LAB — C-REACTIVE PROTEIN: CRP: <1 mg/L (ref 0–10)

## 2023-06-05 LAB — SEDIMENTATION RATE: Sed Rate: 3 mm/h (ref 0–40)

## 2023-06-05 MED ORDER — LIDOCAINE 5 % EX PTCH
1.0000 | MEDICATED_PATCH | CUTANEOUS | Status: DC
  Administered 2023-06-05: 1 via TRANSDERMAL
  Filled 2023-06-05: qty 1

## 2023-06-05 MED ORDER — HYDROCODONE-ACETAMINOPHEN 5-325 MG PO TABS
1.0000 | ORAL_TABLET | Freq: Once | ORAL | Status: AC
  Administered 2023-06-05: 1 via ORAL
  Filled 2023-06-05: qty 1

## 2023-06-05 MED ORDER — REGADENOSON 0.4 MG/5ML IV SOLN
INTRAVENOUS | Status: AC
  Administered 2023-06-05: 0.4 mg via INTRAVENOUS
  Filled 2023-06-05: qty 5

## 2023-06-05 MED ORDER — SODIUM CHLORIDE FLUSH 0.9 % IV SOLN
INTRAVENOUS | Status: AC
  Filled 2023-06-05: qty 10

## 2023-06-05 MED ORDER — TECHNETIUM TC 99M TETROFOSMIN IV KIT
30.2000 | PACK | Freq: Once | INTRAVENOUS | Status: AC | PRN
  Administered 2023-06-05: 30.2 via INTRAVENOUS

## 2023-06-05 MED ORDER — NAPROXEN 500 MG PO TABS: 500.0000 mg | ORAL_TABLET | Freq: Two times a day (BID) | ORAL | 0 refills | Status: DC

## 2023-06-05 MED ORDER — TECHNETIUM TC 99M TETROFOSMIN IV KIT
10.4000 | PACK | Freq: Once | INTRAVENOUS | Status: AC | PRN
  Administered 2023-06-05: 10.4 via INTRAVENOUS

## 2023-06-05 NOTE — ED Triage Notes (Signed)
 Pt reports she lost her balance and fell and hit her ribs on the bathtub. Pt denies hitting head or LOC. Pt reported to the cardiovascular center this morning for a scheduled stress test. Pt did not disclose fall to staff. Pt called her PCP who advised she be seen at the ED. Pt reports pain with movement and breathing.

## 2023-06-05 NOTE — Telephone Encounter (Signed)
  Chief Complaint: rib pain Symptoms: pain ,hurts to breath Frequency: constant   Disposition: [x] ED /[] Urgent Care (no appt availability in office) / [] Appointment(In office/virtual)/ []  [facility]/ [] Home Care/ [] Refused Recommended Disposition /[] [facility]/ []  Follow-up with PCP Additional Notes: Pt fell this morning due to loss of balance. Pt hit bathtub on left side of ribs. Pt is in extreme pain and states "it hurts to breath." Pt denies any bruising or swelling at this time. No provider has appt. RN advised pt to go to ED. Pt was heading there now.         Copied from CRM [id number]. Topic: Clinical - Red Word Triage >> Jun 05, 2023 12:43 PM Antwanette L wrote: Red Word that prompted transfer to Nurse Triage: having trouble breathing and severe pain Reason for Disposition  Injury (or injuries) that need emergency care  Answer Assessment - Initial Assessment Questions 1. MECHANISM: "How did the fall happen?"     Lost balance and hit right rib on bathtub  2. DOMESTIC VIOLENCE AND ELDER ABUSE SCREENING: "Did you fall because someone pushed you or tried to hurt you?" If Yes, ask: "Are you safe now?"     na 3. ONSET: "When did the fall happen?" (e.g., minutes, hours, or days ago)     This morning  4. LOCATION: "What part of the body hit the ground?" (e.g., back, buttocks, head, hips, knees, hands, head, stomach)     Ribs on left side  5. INJURY: "Did you hurt (injure) yourself when you fell?" If Yes, ask: "What did you injure? Tell me more about this?" (e.g., body area; type of injury; pain severity)"     Yes 6. PAIN: "Is there any pain?" If Yes, ask: "How bad is the pain?" (e.g., Scale 1-10; or mild,  moderate, severe)   - NONE (0): No pain   - MILD (1-3): Doesn't interfere with normal activities    - MODERATE (4-7): Interferes with normal activities or awakens from sleep    - SEVERE (8-10): Excruciating pain, unable to do any normal activities       10 7. SIZE: For cuts, bruises, or swelling, ask: "How large is it?" (e.g., inches or centimeters)      na  9. OTHER SYMPTOMS: "Do you have any other symptoms?" (e.g., dizziness, fever, weakness; new onset or worsening).      Denies  10. CAUSE: "What do you think caused the fall (or falling)?" (e.g., tripped, dizzy spell)       Loss of balance  Protocols used: Falls and [facility]

## 2023-06-05 NOTE — Discharge Instructions (Addendum)
 I have sent naproxen to your pharmacy please take twice daily.  You can also take up to 1000 mg of Tylenol every 6 hours, or you can take 650 mg of Tylenol every 6 hours and take Norco for breakthrough pain.  Use ice and heat over area of pain.  You can also use lidocaine patch.  Make sure you are using incentive spirometer.  Follow-up with primary care and return to emergency room with new or worsening symptoms.

## 2023-06-05 NOTE — ED Provider Notes (Signed)
 [place] EMERGENCY DEPARTMENT AT [facility] Provider Note   CSN: [id number] Arrival date & time: 06/05/23  1300     History  Chief Complaint  Patient presents with   Marletta Lor    [name] is a 68 y.o. female with past medical history of congestive heart failure, coronary artery disease, COPD, hypertension, hyperlipidemia presenting to emergency room with complaint of fall.  Patient reports that she was trying to move up that when she slipped and fell landing on left side of her ribs.  Reports she has difficulty taking deep breath then and pain is worse with movement.  She reports this is being constant.  She also had stress test done this morning which she reports she had no significant change in her symptoms since then.   Fall Associated symptoms include chest pain.       Home Medications Prior to Admission medications   Medication Sig Start Date End Date Taking? Authorizing Provider  Accu-Chek Softclix Lancets lancets test blood sugar up to 4 times a day as directed. Dx R73.03 03/02/23   Bennie Pierini, FNP  acyclovir ointment (ZOVIRAX) 5 % apply 1 application topically every 3 (three) hours as needed (outbreaks). 05/22/23   Daphine Deutscher [name], FNP  albuterol (VENTOLIN HFA) 108 (90 Base) MCG/ACT inhaler inhale 2 puffs every 6 hours as needed for shortness of breath and wheezing. 04/16/23   Daphine Deutscher [name], FNP  Alcohol Swabs (B-D SINGLE USE SWABS REGULAR) PADS USE 1 PAD DAILY WHEN CHECKING BLOOD SUGAR. R73.03 06/26/22   Daphine Deutscher, [name], FNP  aspirin EC 325 MG tablet Take 1 tablet (325 mg total) by mouth daily. 09/18/22   Huel Cote, MD  benzonatate (TESSALON) 200 MG capsule Take 1 capsule (200 mg total) by mouth 3 (three) times daily as needed. 03/30/23   Junie Spencer, FNP  blood glucose meter kit and supplies Dispense based on patient and insurance preference. Use up to four times daily as directed. (FOR ICD-10 E10.9, E11.9). 02/04/21    Daphine Deutscher, [name], FNP  busPIRone (BUSPAR) 10 MG tablet Take 1 tablet (10 mg total) by mouth 2 (two) times daily as needed. 12/22/22   Bennie Pierini, FNP  butalbital-acetaminophen-caffeine (FIORICET) [phone] MG tablet take 1 tablet by mouth every 6 (six) hours as needed for headache. 05/22/23   Daphine Deutscher, [name], FNP  carvedilol (COREG) 3.125 MG tablet TAKE 1/2 (0.5) TABLET TWICE A DAY WITH A MEAL; BLOOD PRESSURE OVER 150 BEFORE TAKING. 12/22/22   Daphine Deutscher, [name], FNP  celecoxib (CELEBREX) 200 MG capsule Take 1 capsule (200 mg total) by mouth daily as needed. 05/28/23   Madelyn Brunner, DO  dexlansoprazole (DEXILANT) 60 MG capsule Take 1 capsule (60 mg total) by mouth daily. 12/22/22   Daphine Deutscher, [name], FNP  Dextromethorphan-GG-APAP (CORICIDIN HBP COLD/COUGH/FLU) [phone] MG/15ML LIQD Take 30 mLs by mouth every 4 (four) hours as needed (cough). 02/03/23   Milian, Aleen Campi, FNP  diclofenac Sodium (VOLTAREN) 1 % GEL APPLY 4 GRAMS TOPICALLY 4 TIMES A DAY. Patient taking differently: Apply 4 g topically 4 (four) times daily as needed (pain). 03/01/21   Daphine Deutscher [name], FNP  DULoxetine (CYMBALTA) 60 MG capsule Take 1 capsule (60 mg total) by mouth at bedtime. 12/22/22   Daphine Deutscher, [name], FNP  empagliflozin (JARDIANCE) 25 MG TABS tablet Take 1 tablet (25 mg total) by mouth daily. 12/22/22   Daphine Deutscher, [name], FNP  escitalopram (LEXAPRO) 20 MG tablet Take 1 tablet (20 mg total) by mouth daily. 12/22/22   Bennie Pierini, FNP  estradiol (  ESTRACE) 0.1 MG/GM vaginal cream Place 1 Applicatorful vaginally 3 (three) times a week. 03/20/23   Daphine Deutscher, [name], FNP  FIBER PO Take 1 tablet by mouth in the morning and at bedtime.    [provider]  fludrocortisone (FLORINEF) 0.1 MG tablet take 1 tablet ( total) by mouth every other day. 05/22/23   Daphine Deutscher, [name], FNP  fluticasone (FLONASE) 50 MCG/ACT nasal spray USE 1 SPRAY IN EACH NOSTRIL ONCE  DAILY. Patient taking differently: Place 1 spray into both nostrils daily as needed for allergies. 04/04/20   Daphine Deutscher, [name], FNP  fluticasone furoate-vilanterol (BREO ELLIPTA) 100-25 MCG/ACT AEPB Inhale 1 puff by mouth once daily 04/22/23   Bennie Pierini, FNP  furosemide (LASIX) 40 MG tablet take 1 tablet daily as needed for excessive fluid. 04/16/23   Daphine Deutscher, [name], FNP  Glucagon, rDNA, (GLUCAGON EMERGENCY) 1 MG KIT Inject 1 mg into the skin as needed. 06/26/22   Daphine Deutscher, [name], FNP  glucose blood (ACCU-CHEK GUIDE) test strip TEST BLOOD SUGAR UP TO 4 TIMES A DAY AS DIRECTED. R73.03 09/24/22   Daphine Deutscher, [name], FNP  hydrALAZINE (APRESOLINE) 25 MG tablet take 1 tablet by mouth 3 times daily as needed (for severe hypertension/systolic number 170 or greater). 04/15/23   Antoine Poche, MD  HYDROcodone-acetaminophen (NORCO) 7.5-325 MG tablet Take 1 tablet by mouth in the morning, at noon, and at bedtime. 05/20/23 06/19/23  Daphine Deutscher, [name], FNP  ipratropium (ATROVENT) 0.02 % nebulizer solution USE 1 VIAL ( ) IN NEBULIZER EVERY 6 HOURS AS NEEDED FOR WHEEZING OR SHORTNESS OF BREATH. 06/28/20   Daphine Deutscher, [name], FNP  isosorbide mononitrate (IMDUR) 60 MG 24 hr tablet Take 1 tablet (60 mg total) by mouth 2 (two) times daily. 04/21/23   Antoine Poche, MD  lamoTRIgine (LAMICTAL) 150 MG tablet Take 1 tablet (150 mg total) by mouth daily. 03/19/23   Daphine Deutscher, [name], FNP  levETIRAcetam (KEPPRA) 500 MG tablet Take 1 tablet (500 mg total) by mouth 2 (two) times daily. 12/22/22   Daphine Deutscher [name], FNP  levocetirizine (XYZAL) 5 MG tablet take 1 tablet by mouth every morning. 03/26/23   Daphine Deutscher, [name], FNP  lidocaine (LIDODERM) 5 % place 1 patch onto the skin daily. remove & discard patch within 12 hours or as directed by md 05/25/23   Daphine Deutscher, [name], FNP  lidocaine (XYLOCAINE) 5 % ointment apply to affected area 3 times a day as needed for mild or  moderate pain. 04/16/23   Daphine Deutscher, [name], FNP  methocarbamol (ROBAXIN) 500 MG tablet take 1 tablet (500 MILLIGRAM total) by mouth 4 (four) times daily. 04/24/23   Daphine Deutscher, [name], FNP  nitroGLYCERIN (NITROSTAT) 0.4 MG SL tablet place one (1) tablet under tongue every 5 minutes up to (3) doses as needed for chest pain. 05/25/23   Daphine Deutscher [name], FNP  ondansetron (ZOFRAN) 4 MG tablet take 1 tablet by mouth every 8 hours as needed for nausea and vomiting. 05/22/23   Daphine Deutscher, [name], FNP  oxybutynin (DITROPAN-XL) 5 MG 24 hr tablet Take 1 tablet (5 mg total) by mouth at bedtime. 11/26/21   Daphine Deutscher [name], FNP  Probiotic Product (PROBIOTIC PO) Take 1 capsule by mouth in the morning.    [provider]  promethazine-dextromethorphan (PROMETHAZINE-DM) 6.25-15 MG/5ML syrup Take 5 mLs by mouth 3 (three) times daily as needed for cough. 04/03/23   Junie Spencer, FNP  rosuvastatin (CRESTOR) 10 MG tablet Take 1 tablet (10 mg total) by mouth daily. 12/22/22   Daphine Deutscher [name], FNP  traZODone (DESYREL) 150 MG tablet Take  1 tablet (150 mg total) by mouth at bedtime. 12/22/22   Daphine Deutscher, [name], FNP  valACYclovir (VALTREX) 1000 MG tablet Take 1 tablet (1,000 mg total) by mouth 3 (three) times daily as needed (with active outbreaks.). 03/19/23   Daphine Deutscher, [name], FNP  valACYclovir (VALTREX) 500 MG tablet TAKE (1) TABLET TWICE DAILY. 03/19/23   Daphine Deutscher [name], FNP      Allergies    Codeine, Morphine and codeine, Ambien [zolpidem tartrate], Clonidine derivatives, Metformin and related, Lyrica [pregabalin], and Neurontin [gabapentin]    Review of Systems   Review of Systems  Cardiovascular:  Positive for chest pain.    Physical Exam Updated Vital Signs BP 128/86 (BP Location: Right Arm)   Pulse 82   Temp 99.6 F (37.6 C) (Oral)   Resp 16   Ht 5\' 1"  (1.549 m)   Wt 54.4 kg   SpO2 95%   BMI 22.67 kg/m  Physical Exam Vitals and nursing note reviewed.   Constitutional:      General: She is not in acute distress.    Appearance: She is not toxic-appearing.  HENT:     Head: Normocephalic and atraumatic.  Eyes:     General: No scleral icterus.    Conjunctiva/sclera: Conjunctivae normal.  Cardiovascular:     Rate and Rhythm: Normal rate and regular rhythm.     Pulses: Normal pulses.     Heart sounds: Normal heart sounds.  Pulmonary:     Effort: Pulmonary effort is normal. No respiratory distress.     Breath sounds: Normal breath sounds.     Comments: Left lower anterior and lateral rib pain tenderness to palpation without any deformity or ecchymosis. Chest:     Chest wall: Tenderness present.  Abdominal:     General: Abdomen is flat. Bowel sounds are normal.     Palpations: Abdomen is soft.     Tenderness: There is no abdominal tenderness.  Skin:    General: Skin is warm and dry.     Findings: No lesion.  Neurological:     General: No focal deficit present.     Mental Status: She is alert and oriented to person, place, and time. Mental status is at baseline.     ED Results / Procedures / Treatments   Labs (all labs ordered are listed, but only abnormal results are displayed) Labs Reviewed - No data to display  EKG None  Radiology DG Ribs Unilateral W/Chest Left Result Date: 06/05/2023 CLINICAL DATA:  Left rib pain and tenderness after falling this morning. EXAM: LEFT RIBS AND CHEST - 3+ VIEW COMPARISON:  Chest radiographs dated 05/29/2023 FINDINGS: Normal-sized heart. Clear lungs with normal vascularity. No rib fracture or pneumothorax seen. Stable partially included right shoulder prosthesis and partially included lumbar spine fixation hardware. Stable wireless cardiac device overlying the left chest. IMPRESSION: 1. No rib fracture or pneumothorax. 2. No acute abnormality. Electronically Signed   By: Beckie Salts M.D.   On: 06/05/2023 17:51   NM Myocar Multi W/Spect W/Wall Motion / EF Result Date: 06/05/2023   Stress ECG is  negative for ischemia and arrhythmias.   LV perfusion is normal. There is no evidence of ischemia. There is no evidence of infarction.   Left ventricular function is normal. Nuclear stress EF: 74%. The left ventricular ejection fraction is hyperdynamic (>65%). End diastolic cavity size is normal. End systolic cavity size is normal.   Findings are consistent with no ischemia and no infarction. The study is low risk.    Procedures  Procedures    Medications Ordered in ED Medications - No data to display  ED Course/ Medical Decision Making/ A&P                                 Medical Decision Making Amount and/or Complexity of Data Reviewed Radiology: ordered.  Risk Prescription drug management.   This patient presents to the ED for concern of rib pain, this involves an extensive number of treatment options, and is a complaint that carries with it a high risk of complications and morbidity.  The differential diagnosis includes pneumonia, pneumothorax, chest contusion, rib fracture, flail chest, ACS    Problem List / ED Course / Critical interventions / Medication management  Patient reporting after a fall earlier this morning.  Patient fell landing on her left side of ribs.  Patient reports that she had pain initially after injury however it is slowly gotten worse.  She reports worsened taking deep breath and worse with movement.  She has not had any other injuries from the fall.  She did not hit her head she is not on blood thinner.  She is hemodynamically stable and well-appearing.  Will obtain chest x-ray to rule out rib fracture.  Suspect that patient will be stable for discharge given good pain control.  Will teach how to use incentive spirometry. Patient's pain controlled after lidocaine and Norco.  She does have negative checks x-ray for any acute abnormality.  Did discuss obtaining CT scan with her however given this will likely not change treatment we will proceed with symptomatic  management.  She has Norco 7.5 mg to take at home.  She will also take Tylenol and naproxen.  She has lidocaine patches to use as well and will use ice and heat.  Given incentive spirometer and taught how to use prior to discharge given COPD and other comorbidities.  She will follow-up with her primary care doctor.  She is stable for discharge. I ordered medication including sodium, Lidoderm Reevaluation of the patient after these medicines showed that the patient improved I have reviewed the patients home medicines and have made adjustments as needed   Plan  F/u w/ PCP in 2-3d to ensure resolution of sx.  Patient was given return precautions. Patient stable for discharge at this time.  Patient educated on sx/dx and verbalized understanding of plan. Return to ER w/ new or worsening sx.          Final Clinical Impression(s) / ED Diagnoses Final diagnoses:  Contusion of rib on left side, initial encounter    Rx / DC Orders ED Discharge Orders          Ordered    naproxen (NAPROSYN) 500 MG tablet  2 times daily        06/05/23 1824              Pakou Rainbow, Horald Chestnut, PA-C 06/05/23 2259    Eber Hong, MD 06/06/23 [phone]

## 2023-06-16 ENCOUNTER — Ambulatory Visit (INDEPENDENT_AMBULATORY_CARE_PROVIDER_SITE_OTHER): Payer: 59 | Admitting: Nurse Practitioner

## 2023-06-16 ENCOUNTER — Encounter: Payer: Self-pay | Admitting: Nurse Practitioner

## 2023-06-16 VITALS — BP 135/85 | HR 64 | Temp 97.8°F | Ht 62.0 in | Wt 124.0 lb

## 2023-06-16 DIAGNOSIS — I10 Essential (primary) hypertension: Secondary | ICD-10-CM

## 2023-06-16 DIAGNOSIS — M79645 Pain in left finger(s): Secondary | ICD-10-CM

## 2023-06-16 DIAGNOSIS — R0789 Other chest pain: Secondary | ICD-10-CM

## 2023-06-16 DIAGNOSIS — K5790 Diverticulosis of intestine, part unspecified, without perforation or abscess without bleeding: Secondary | ICD-10-CM

## 2023-06-16 DIAGNOSIS — I5032 Chronic diastolic (congestive) heart failure: Secondary | ICD-10-CM | POA: Diagnosis not present

## 2023-06-16 DIAGNOSIS — B009 Herpesviral infection, unspecified: Secondary | ICD-10-CM

## 2023-06-16 DIAGNOSIS — R569 Unspecified convulsions: Secondary | ICD-10-CM

## 2023-06-16 DIAGNOSIS — E876 Hypokalemia: Secondary | ICD-10-CM

## 2023-06-16 DIAGNOSIS — M79644 Pain in right finger(s): Secondary | ICD-10-CM

## 2023-06-16 DIAGNOSIS — K2100 Gastro-esophageal reflux disease with esophagitis, without bleeding: Secondary | ICD-10-CM | POA: Diagnosis not present

## 2023-06-16 DIAGNOSIS — Q762 Congenital spondylolisthesis: Secondary | ICD-10-CM

## 2023-06-16 DIAGNOSIS — R7303 Prediabetes: Secondary | ICD-10-CM

## 2023-06-16 DIAGNOSIS — F5101 Primary insomnia: Secondary | ICD-10-CM

## 2023-06-16 DIAGNOSIS — E785 Hyperlipidemia, unspecified: Secondary | ICD-10-CM | POA: Diagnosis not present

## 2023-06-16 DIAGNOSIS — F411 Generalized anxiety disorder: Secondary | ICD-10-CM

## 2023-06-16 DIAGNOSIS — G894 Chronic pain syndrome: Secondary | ICD-10-CM

## 2023-06-16 DIAGNOSIS — F3341 Major depressive disorder, recurrent, in partial remission: Secondary | ICD-10-CM

## 2023-06-16 DIAGNOSIS — G4733 Obstructive sleep apnea (adult) (pediatric): Secondary | ICD-10-CM

## 2023-06-16 MED ORDER — CELECOXIB 200 MG PO CAPS: 200.0000 mg | ORAL_CAPSULE | Freq: Every day | ORAL | 0 refills | Status: DC | PRN

## 2023-06-16 MED ORDER — DULOXETINE HCL 60 MG PO CPEP: 60.0000 mg | ORAL_CAPSULE | Freq: Every day | ORAL | 1 refills | Status: DC

## 2023-06-16 MED ORDER — HYDROCODONE-ACETAMINOPHEN 7.5-325 MG PO TABS: 1.0000 | ORAL_TABLET | Freq: Three times a day (TID) | ORAL | 0 refills | Status: AC

## 2023-06-16 MED ORDER — VALACYCLOVIR HCL 1 G PO TABS: 1000.0000 mg | ORAL_TABLET | Freq: Three times a day (TID) | ORAL | 0 refills | Status: DC | PRN

## 2023-06-16 MED ORDER — TRAZODONE HCL 150 MG PO TABS: 150.0000 mg | ORAL_TABLET | Freq: Every day | ORAL | 1 refills | Status: DC

## 2023-06-16 MED ORDER — ROSUVASTATIN CALCIUM 10 MG PO TABS: 10.0000 mg | ORAL_TABLET | Freq: Every day | ORAL | 1 refills | Status: DC

## 2023-06-16 MED ORDER — FUROSEMIDE 40 MG PO TABS: ORAL_TABLET | ORAL | 1 refills | Status: DC

## 2023-06-16 MED ORDER — ESCITALOPRAM OXALATE 20 MG PO TABS: 20.0000 mg | ORAL_TABLET | Freq: Every day | ORAL | 1 refills | Status: DC

## 2023-06-16 MED ORDER — DEXLANSOPRAZOLE 60 MG PO CPDR: 1.0000 | DELAYED_RELEASE_CAPSULE | Freq: Every day | ORAL | 1 refills | Status: DC

## 2023-06-16 MED ORDER — LEVETIRACETAM 500 MG PO TABS: 500.0000 mg | ORAL_TABLET | Freq: Two times a day (BID) | ORAL | 1 refills | Status: DC

## 2023-06-16 MED ORDER — CARVEDILOL 3.125 MG PO TABS: ORAL_TABLET | ORAL | 1 refills | Status: DC

## 2023-06-16 MED ORDER — HYDROCODONE-ACETAMINOPHEN 7.5-325 MG PO TABS: 1.0000 | ORAL_TABLET | Freq: Three times a day (TID) | ORAL | 0 refills | Status: DC

## 2023-06-16 MED ORDER — EMPAGLIFLOZIN 25 MG PO TABS: 25.0000 mg | ORAL_TABLET | Freq: Every day | ORAL | 1 refills | Status: DC

## 2023-06-16 MED ORDER — BUSPIRONE HCL 10 MG PO TABS: 10.0000 mg | ORAL_TABLET | Freq: Two times a day (BID) | ORAL | 5 refills | Status: DC | PRN

## 2023-06-16 MED ORDER — ISOSORBIDE MONONITRATE ER 60 MG PO TB24: 60.0000 mg | ORAL_TABLET | Freq: Two times a day (BID) | ORAL | 6 refills | Status: DC

## 2023-06-16 NOTE — Progress Notes (Signed)
 Subjective:    Patient ID: [name], female    DOB: [birth year], 69 y.o.   MRN: [id number]   Chief Complaint: medical management of chronic issues     HPI:  [name] is a 68 y.o. who identifies as a female who was assigned female at birth.   Social history: Lives with: her niece stays with her alot Work history: disability   Comes in today for follow up of the following chronic medical issues:  1. Essential hypertension No c/o chest pain, sob or headache. Does not check blood pressure at home very often. BP Readings from Last 3 Encounters:  06/05/23 128/86  06/02/23 120/76  05/29/23 129/83     2. Chronic diastolic CHF (congestive heart failure) (HCC) Last saw cardiology on 06/02/23. Has been having chest pain and they dont know why. They are currently running more tests.  3. Obstructive sleep apnea syndrome Does not wear CPAP daily  4. Diverticulosis No flare ups since her last follow up  5. Gastroesophageal reflux disease with esophagitis without hemorrhage Is on dexilant daily and is doing well right now  6. Hyperlipidemia with target LDL less than 100 Does not really watch diet and does no dedicated exercise. Lab Results  Component Value Date   CHOL 127 12/22/2022   HDL 45 12/22/2022   LDLCALC 64 12/22/2022   LDLDIRECT 142.1 04/22/2007   TRIG 96 12/22/2022   CHOLHDL 2.8 12/22/2022    7. Hypokalemia Denies any muscle cramps Lab Results  Component Value Date   K 4.4 05/29/2023     8. GAD (generalized anxiety disorder) Is on lexapro and cymbalta combination. Ays she has been doing ok    03/19/2023    3:03 PM 02/17/2023    9:32 AM 02/03/2023   10:31 AM 12/22/2022    3:03 PM  GAD 7 : Generalized Anxiety Score  Nervous, Anxious, on Edge 0 0 0 0  Control/stop worrying 0 0 0 0  Worry too much - different things 0 0 0 0  Trouble relaxing 2 0 0 0  Restless 0 0 0 0  Easily annoyed or irritable 0 0 0 0  Afraid - awful might happen 0 0 0  0  Total GAD 7 Score 2 0 0 0  Anxiety Difficulty Not difficult at all Not difficult at all Not difficult at all Not difficult at all      9. Seizures (HCC) Is still on keppra- no recent seizure activity. But she did pass out at church Sunday.   10. Recurrent major depressive disorder, in partial remission (HCC) Again is on cymbalta and lexapro.    03/19/2023    3:02 PM 02/17/2023    9:31 AM 02/03/2023   10:31 AM  Depression screen PHQ 2/9  Decreased Interest 0 0 0  Down, Depressed, Hopeless 0 0 0  PHQ - 2 Score 0 0 0  Altered sleeping  0 0  Tired, decreased energy  0 3  Change in appetite  0 0  Feeling bad or failure about yourself   0 0  Trouble concentrating  3 3  Moving slowly or fidgety/restless  0 0  Suicidal thoughts  0 0  PHQ-9 Score  3 6  Difficult doing work/chores  Not difficult at all Not difficult at all      11 . Chronic pain syndrome She is on norco 7.5/325 TID. She has had dilaudid and oxycodone after surgeries recently, but I was informed of this prior to surgery.  Pain assessment: Cause of pain- DDD Pain location- lower back Pain on scale of 1-10- 5/10 currently Frequency- daily What increases pain-activity What makes pain Better-nothing Effects on ADL - none Any change in general medical condition-none  Current opioids rx- norco 7.5/325 tid # meds rx- 90 Effectiveness of current meds-helps Adverse reactions from pain meds-none Morphine equivalent-  Pill count performed-No Last drug screen - long time ( high risk q75m, moderate risk q38m, low risk yearly ) Urine drug screen today- Yes Was the [facility] reviewed- yes  If yes were their any concerning findings? - no   Overdose risk: 1    09/12/2021    3:09 PM  Opioid Risk   Alcohol 0  Illegal Drugs 0  Rx Drugs 0  Alcohol 0  Illegal Drugs 0  Rx Drugs 0  Age between 16-45 years  0  History of Preadolescent Sexual Abuse 3  Psychological Disease 2  ADD Negative  OCD Negative  Bipolar  Positive  Depression 1  Opioid Risk Tool Scoring 6  Opioid Risk Interpretation Moderate Risk     Pain contract signed on:   12. Primary insomnia Is on no sleep aids due to taking pain medication. Sleeps well some nights and not so well other nights.   New complaints: None today  Allergies  Allergen Reactions   Codeine Other (See Comments)    "I will have a heart attack."   Morphine And Codeine Other (See Comments)    "It will cause me to have a heart attack."   Ambien [Zolpidem Tartrate] Nausea And Vomiting   Clonidine Derivatives Nausea And Vomiting    gerd - caused acid reflux per pt   Metformin And Related Nausea And Vomiting    cramping from metformin   Lyrica [Pregabalin] Swelling and Other (See Comments)    Weight gain   Neurontin [Gabapentin] Other (See Comments)    Causes elevated LFTs    Outpatient Encounter Medications as of 06/16/2023  Medication Sig   Accu-Chek Softclix Lancets lancets test blood sugar up to 4 times a day as directed. Dx R73.03   acyclovir ointment (ZOVIRAX) 5 % apply 1 application topically every 3 (three) hours as needed (outbreaks).   albuterol (VENTOLIN HFA) 108 (90 Base) MCG/ACT inhaler inhale 2 puffs every 6 hours as needed for shortness of breath and wheezing.   Alcohol Swabs (B-D SINGLE USE SWABS REGULAR) PADS USE 1 PAD DAILY WHEN CHECKING BLOOD SUGAR. R73.03   aspirin EC 325 MG tablet Take 1 tablet (325 mg total) by mouth daily.   benzonatate (TESSALON) 200 MG capsule Take 1 capsule (200 mg total) by mouth 3 (three) times daily as needed.   blood glucose meter kit and supplies Dispense based on patient and insurance preference. Use up to four times daily as directed. (FOR ICD-10 E10.9, E11.9).   busPIRone (BUSPAR) 10 MG tablet Take 1 tablet (10 mg total) by mouth 2 (two) times daily as needed.   butalbital-acetaminophen-caffeine (FIORICET) 50-325-40 MG tablet take 1 tablet by mouth every 6 (six) hours as needed for headache.    carvedilol (COREG) 3.125 MG tablet TAKE 1/2 (0.5) TABLET TWICE A DAY WITH A MEAL; BLOOD PRESSURE OVER 150 BEFORE TAKING.   celecoxib (CELEBREX) 200 MG capsule Take 1 capsule (200 mg total) by mouth daily as needed.   dexlansoprazole (DEXILANT) 60 MG capsule Take 1 capsule (60 mg total) by mouth daily.   Dextromethorphan-GG-APAP (CORICIDIN HBP COLD/COUGH/FLU) 10-200-325 MG/15ML LIQD Take 30 mLs by mouth every 4 (  four) hours as needed (cough).   diclofenac Sodium (VOLTAREN) 1 % GEL APPLY 4 GRAMS TOPICALLY 4 TIMES A DAY. (Patient taking differently: Apply 4 g topically 4 (four) times daily as needed (pain).)   DULoxetine (CYMBALTA) 60 MG capsule Take 1 capsule (60 mg total) by mouth at bedtime.   empagliflozin (JARDIANCE) 25 MG TABS tablet Take 1 tablet (25 mg total) by mouth daily.   escitalopram (LEXAPRO) 20 MG tablet Take 1 tablet (20 mg total) by mouth daily.   estradiol (ESTRACE) 0.1 MG/GM vaginal cream Place 1 Applicatorful vaginally 3 (three) times a week.   FIBER PO Take 1 tablet by mouth in the morning and at bedtime.   fludrocortisone (FLORINEF) 0.1 MG tablet take 1 tablet ( total) by mouth every other day.   fluticasone (FLONASE) 50 MCG/ACT nasal spray USE 1 SPRAY IN EACH NOSTRIL ONCE DAILY. (Patient taking differently: Place 1 spray into both nostrils daily as needed for allergies.)   fluticasone furoate-vilanterol (BREO ELLIPTA) 100-25 MCG/ACT AEPB Inhale 1 puff by mouth once daily   furosemide (LASIX) 40 MG tablet take 1 tablet daily as needed for excessive fluid.   Glucagon, rDNA, (GLUCAGON EMERGENCY) 1 MG KIT Inject 1 mg into the skin as needed.   glucose blood (ACCU-CHEK GUIDE) test strip TEST BLOOD SUGAR UP TO 4 TIMES A DAY AS DIRECTED. R73.03   hydrALAZINE (APRESOLINE) 25 MG tablet take 1 tablet by mouth 3 times daily as needed (for severe hypertension/systolic number 170 or greater).   HYDROcodone-acetaminophen (NORCO) 7.5-325 MG tablet Take 1 tablet by mouth in the morning,  at noon, and at bedtime.   ipratropium (ATROVENT) 0.02 % nebulizer solution USE 1 VIAL ( ) IN NEBULIZER EVERY 6 HOURS AS NEEDED FOR WHEEZING OR SHORTNESS OF BREATH.   isosorbide mononitrate (IMDUR) 60 MG 24 hr tablet Take 1 tablet (60 mg total) by mouth 2 (two) times daily.   lamoTRIgine (LAMICTAL) 150 MG tablet Take 1 tablet (150 mg total) by mouth daily.   levETIRAcetam (KEPPRA) 500 MG tablet Take 1 tablet (500 mg total) by mouth 2 (two) times daily.   levocetirizine (XYZAL) 5 MG tablet take 1 tablet by mouth every morning.   lidocaine (LIDODERM) 5 % place 1 patch onto the skin daily. remove & discard patch within 12 hours or as directed by md   lidocaine (XYLOCAINE) 5 % ointment apply to affected area 3 times a day as needed for mild or moderate pain.   methocarbamol (ROBAXIN) 500 MG tablet take 1 tablet (500 MILLIGRAM total) by mouth 4 (four) times daily.   naproxen (NAPROSYN) 500 MG tablet Take 1 tablet (500 mg total) by mouth 2 (two) times daily.   nitroGLYCERIN (NITROSTAT) 0.4 MG SL tablet place one (1) tablet under tongue every 5 minutes up to (3) doses as needed for chest pain.   ondansetron (ZOFRAN) 4 MG tablet take 1 tablet by mouth every 8 hours as needed for nausea and vomiting.   oxybutynin (DITROPAN-XL) 5 MG 24 hr tablet Take 1 tablet (5 mg total) by mouth at bedtime.   Probiotic Product (PROBIOTIC PO) Take 1 capsule by mouth in the morning.   promethazine-dextromethorphan (PROMETHAZINE-DM) 6.25-15 MG/5ML syrup Take 5 mLs by mouth 3 (three) times daily as needed for cough.   rosuvastatin (CRESTOR) 10 MG tablet Take 1 tablet (10 mg total) by mouth daily.   traZODone (DESYREL) 150 MG tablet Take 1 tablet (150 mg total) by mouth at bedtime.   valACYclovir (VALTREX) 1000 MG tablet Take  1 tablet (1,000 mg total) by mouth 3 (three) times daily as needed (with active outbreaks.).   valACYclovir (VALTREX) 500 MG tablet TAKE (1) TABLET TWICE DAILY.   No facility-administered encounter  medications on file as of 06/16/2023.    Past Surgical History:  Procedure Laterality Date   BACK SURGERY     BREAST EXCISIONAL BIOPSY Right    1990s   BREAST SURGERY     lumpectomy   CARDIAC CATHETERIZATION  10/06/2011   CATARACT EXTRACTION W/PHACO Right 07/31/2017   Procedure: CATARACT EXTRACTION PHACO AND INTRAOCULAR LENS PLACEMENT (IOC);  Surgeon: Fabio Pierce, MD;  Location: [facility];  Service: Ophthalmology;  Laterality: Right;  CDE: 2.33   CATARACT EXTRACTION W/PHACO Left 08/14/2017   Procedure: CATARACT EXTRACTION PHACO AND INTRAOCULAR LENS PLACEMENT (IOC);  Surgeon: Fabio Pierce, MD;  Location: [facility];  Service: Ophthalmology;  Laterality: Left;  CDE: 2.74   CHOLECYSTECTOMY     CYSTOSCOPY     stone   DIAGNOSTIC LAPAROSCOPY     laparoscopic cholecystectomy   DOPPLER ECHOCARDIOGRAPHY  2009   ESOPHAGOGASTRODUODENOSCOPY N/A 10/31/2020   Procedure: ESOPHAGOGASTRODUODENOSCOPY (EGD);  Surgeon: Gaynelle Adu, MD;  Location: [facility];  Service: General;  Laterality: N/A;   EYE SURGERY Bilateral 08/14/2017   cataract removal   GLUTEUS MINIMUS REPAIR Left 11/04/2022   Procedure: LEFT GLUTEUS MEDIUS REPAIR;  Surgeon: Huel Cote, MD;  Location: [facility];  Service: Orthopedics;  Laterality: Left;   head up tilt table testing  06/15/2007   [name]   HEMORRHOID SURGERY     HERNIA REPAIR     insertion of implatable loop recorder  08/11/2007   [name]   POSTERIOR CERVICAL FUSION/FORAMINOTOMY N/A 12/19/2013   Procedure: RIGHT C3-4.C4-5 AND C5-6 FORAMINOTOMIES;  Surgeon: Kerrin Champagne, MD;  Location[facility];  Service: Orthopedics;  Laterality: N/A;   TOTAL HIP ARTHROPLASTY Left 01/31/2020   Procedure: LEFT TOTAL HIP ARTHROPLASTY ANTERIOR APPROACH;  Surgeon: Kathryne Hitch, MD;  Location: [facility] OR;  Service: Orthopedics;  Laterality: Left;   TOTAL SHOULDER ARTHROPLASTY Right 11/15/2019   Procedure: RIGHT TOTAL SHOULDER ARTHROPLASTY;  Surgeon: Cammy Copa, MD;  Location:  [facility];  Service: Orthopedics;  Laterality: Right;   TOTAL SHOULDER REVISION Right 06/11/2021   Procedure: RIGHT SHOULDER CONVERSION TOTAL SHOULDER ARTHROPLASTY to REVERSE TOTAL SHOULDER ARTHROPLASTY;  Surgeon: Cammy Copa, MD;  Location: [facility];  Service: Orthopedics;  Laterality: Right;   TUBAL LIGATION     UPPER GASTROINTESTINAL ENDOSCOPY     XI ROBOTIC ASSISTED HIATAL HERNIA REPAIR N/A 10/31/2020   Procedure: XI ROBOTIC ASSISTED HIATAL HERNIA REPAIR WITH PARTIAL FUNDOPLICATION;  Surgeon: Gaynelle Adu, MD;  Location: [facility];  Service: General;  Laterality: N/A;    Family History  Problem Relation Age of Onset   Heart attack Father    Mental illness Father    Mental illness Mother    Heart attack Brother        stents   Alcohol abuse Brother    Heart disease Brother    Drug abuse Brother    Diabetes Brother    Colon cancer Maternal Aunt    Cirrhosis Brother    Stomach cancer Neg Hx    Esophageal cancer Neg Hx    Rectal cancer Neg Hx       Controlled substance contract: 04/02/22     Review of Systems  Constitutional:  Negative for diaphoresis.  Eyes:  Negative for pain.  Respiratory:  Negative for shortness of breath.  Cardiovascular:  Negative for chest pain, palpitations and leg swelling.  Gastrointestinal:  Negative for abdominal pain.  Endocrine: Negative for polydipsia.  Skin:  Negative for rash.  Neurological:  Negative for dizziness, weakness and headaches.  Hematological:  Does not bruise/bleed easily.  All other systems reviewed and are negative.      Objective:   Physical Exam Vitals and nursing note reviewed.  Constitutional:      General: She is not in acute distress.    Appearance: Normal appearance. She is well-developed.  HENT:     Head: Normocephalic.     Right Ear: Tympanic membrane normal.     Left Ear: Tympanic membrane normal.     Nose: Nose normal.     Mouth/Throat:     Mouth: Mucous membranes are moist.  Eyes:     Pupils:  Pupils are equal, round, and reactive to light.  Neck:     Vascular: No carotid bruit or JVD.  Cardiovascular:     Rate and Rhythm: Normal rate and regular rhythm.     Heart sounds: Normal heart sounds.  Pulmonary:     Effort: Pulmonary effort is normal. No respiratory distress.     Breath sounds: Normal breath sounds. No wheezing or rales.  Chest:     Chest wall: No tenderness.  Abdominal:     General: Bowel sounds are normal. There is no distension or abdominal bruit.     Palpations: Abdomen is soft. There is no hepatomegaly, splenomegaly, mass or pulsatile mass.     Tenderness: There is no abdominal tenderness.  Musculoskeletal:        General: Normal range of motion.     Cervical back: Normal range of motion and neck supple.     Comments: Moves slowly from sitting to standing Gait slow and steady  Lymphadenopathy:     Cervical: No cervical adenopathy.  Skin:    General: Skin is warm and dry.  Neurological:     Mental Status: She is alert and oriented to person, place, and time.     Deep Tendon Reflexes: Reflexes are normal and symmetric.  Psychiatric:        Behavior: Behavior normal.        Thought Content: Thought content normal.        Judgment: Judgment normal.    There were no vitals taken for this visit.        Assessment & Plan:  DENYS LABREE comes in today with chief complaint of No chief complaint on file.   Diagnosis and orders addressed:  1. Essential hypertension Low sodium diet - carvedilol (COREG) 3.125 MG tablet; TAKE 1/2 (0.5) TABLET TWICE A DAY WITH A MEAL; BLOOD PRESSURE OVER 150 BEFORE TAKING.  Dispense: 90 tablet; Refill: 1 - furosemide (LASIX) 40 MG tablet; TAKE 1 TABLET DAILY AS NEEDED FOR EXCESSIVE FLUID.  Dispense: 30 tablet; Refill: 1  2. Chronic diastolic CHF (congestive heart failure) (HCC) Keep follow up with cardiology  3. Obstructive sleep apnea syndrome Wear cpapa  4. Diverticulosis Watch diet to prevent flare up  5.  Gastroesophageal reflux disease with esophagitis without hemorrhage Avoid spicy foods Do not eat 2 hours prior to bedtime - dexlansoprazole (DEXILANT) 60 MG capsule; Take 1 capsule (60 mg total) by mouth daily.  Dispense: 90 capsule; Refill: 1  6. Hyperlipidemia with target LDL less than 100 Low ftadiet - rosuvastatin (CRESTOR) 10 MG tablet; Take 1 tablet (10 mg total) by mouth daily.  Dispense: 90 tablet; Refill:  1  7. Hypokalemia Labs pending  8. GAD (generalized anxiety disorder) Stress management - lamoTRIgine (LAMICTAL) 150 MG tablet; Take 1 tablet (150 mg total) by mouth daily.  Dispense: 30 tablet; Refill: 1  9. Seizures (HCC) Report any seizure activity - levETIRAcetam (KEPPRA) 500 MG tablet; Take 1 tablet (500 mg total) by mouth 2 (two) times daily.  Dispense: 180 tablet; Refill: 1  10. Recurrent major depressive disorder, in partial remission (HCC) - busPIRone (BUSPAR) 10 MG tablet; Take 1 tablet (10 mg total) by mouth 2 (two) times daily as needed.  Dispense: 60 tablet; Refill: 5 - DULoxetine (CYMBALTA) 60 MG capsule; Take 1 capsule (60 mg total) by mouth at bedtime.  Dispense: 90 capsule; Refill: 1 - escitalopram (LEXAPRO) 20 MG tablet; Take 1 tablet (20 mg total) by mouth daily.  Dispense: 90 tablet; Refill: 1  11. Chronic pain syndrome  12. Primary insomnia Bedtime routine - traZODone (DESYREL) 150 MG tablet; Take 1 tablet (150 mg total) by mouth at bedtime.  Dispense: 90 tablet; Refill: 1  13. Dyspareunia in female - estradiol (ESTRACE) 0.1 MG/GM vaginal cream; Place 1 Applicatorful vaginally 3 (three) times a week.  Dispense: 42.5 g; Refill: 3  14. Herpes simplex virus (HSV) infection - valACYclovir (VALTREX) 1000 MG tablet; Take 1 tablet (1,000 mg total) by mouth 3 (three) times daily as needed (with active outbreaks.).  Dispense: 21 tablet; Refill: 0  15. Pre-diabetes Watch carbs in diet - empagliflozin (JARDIANCE) 25 MG TABS tablet; Take 1 tablet (25 mg  total) by mouth daily.  Dispense: 90 tablet; Refill: 1  16. SPONDYLOLISTHESIS - HYDROcodone-acetaminophen (NORCO) 7.5-325 MG tablet; Take 1 tablet by mouth in the morning, at noon, and at bedtime.  Dispense: 90 tablet; Refill: 0 - HYDROcodone-acetaminophen (NORCO) 7.5-325 MG tablet; Take 1 tablet by mouth in the morning, at noon, and at bedtime.  Dispense: 90 tablet; Refill: 0 - HYDROcodone-acetaminophen (NORCO) 7.5-325 MG tablet; Take 1 tablet by mouth in the morning, at noon, and at bedtime.  Dispense: 90 tablet; Refill: 0   Labs pending Health Maintenance reviewed Diet and exercise encouraged  Follow up plan: 3 months- pain management   [name] Daphine Deutscher, FNP

## 2023-06-16 NOTE — Patient Instructions (Signed)
 Fall Prevention in the Home, Adult Falls can cause injuries and can happen to people of all ages. There are many things you can do to make your home safer and to help prevent falls. What actions can I take to prevent falls? General information Use good lighting in all rooms. Make sure to: Replace any light bulbs that burn out. Turn on the lights in dark areas and use night-lights. Keep items that you use often in easy-to-reach places. Lower the shelves around your home if needed. Move furniture so that there are clear paths around it. Do not use throw rugs or other things on the floor that can make you trip. If any of your floors are uneven, fix them. Add color or contrast paint or tape to clearly mark and help you see: Grab bars or handrails. First and last steps of staircases. Where the edge of each step is. If you use a ladder or stepladder: Make sure that it is fully opened. Do not climb a closed ladder. Make sure the sides of the ladder are locked in place. Have someone hold the ladder while you use it. Know where your pets are as you move through your home. What can I do in the bathroom?     Keep the floor dry. Clean up any water on the floor right away. Remove soap buildup in the bathtub or shower. Buildup makes bathtubs and showers slippery. Use non-skid mats or decals on the floor of the bathtub or shower. Attach bath mats securely with double-sided, non-slip rug tape. If you need to sit down in the shower, use a non-slip stool. Install grab bars by the toilet and in the bathtub and shower. Do not use towel bars as grab bars. What can I do in the bedroom? Make sure that you have a light by your bed that is easy to reach. Do not use any sheets or blankets on your bed that hang to the floor. Have a firm chair or bench with side arms that you can use for support when you get dressed. What can I do in the kitchen? Clean up any spills right away. If you need to reach something  above you, use a step stool with a grab bar. Keep electrical cords out of the way. Do not use floor polish or wax that makes floors slippery. What can I do with my stairs? Do not leave anything on the stairs. Make sure that you have a light switch at the top and the bottom of the stairs. Make sure that there are handrails on both sides of the stairs. Fix handrails that are broken or loose. Install non-slip stair treads on all your stairs if they do not have carpet. Avoid having throw rugs at the top or bottom of the stairs. Choose a carpet that does not hide the edge of the steps on the stairs. Make sure that the carpet is firmly attached to the stairs. Fix carpet that is loose or worn. What can I do on the outside of my home? Use bright outdoor lighting. Fix the edges of walkways and driveways and fix any cracks. Clear paths of anything that can make you trip, such as tools or rocks. Add color or contrast paint or tape to clearly mark and help you see anything that might make you trip as you walk through a door, such as a raised step or threshold. Trim any bushes or trees on paths to your home. Check to see if handrails are loose  or broken and that both sides of all steps have handrails. Install guardrails along the edges of any raised decks and porches. Have leaves, snow, or ice cleared regularly. Use sand, salt, or ice melter on paths if you live where there is ice and snow during the winter. Clean up any spills in your garage right away. This includes grease or oil spills. What other actions can I take? Review your medicines with your doctor. Some medicines can cause dizziness or changes in blood pressure, which increase your risk of falling. Wear shoes that: Have a low heel. Do not wear high heels. Have rubber bottoms and are closed at the toe. Feel good on your feet and fit well. Use tools that help you move around if needed. These include: Canes. Walkers. Scooters. Crutches. Ask  your doctor what else you can do to help prevent falls. This may include seeing a physical therapist to learn to do exercises to move better and get stronger. Where to find more information Centers for Disease Control and Prevention, [employer]: TonerPromos.no [employer] on Aging: BaseRingTones.pl National Institute on Aging: BaseRingTones.pl Contact a doctor if: You are afraid of falling at home. You feel weak, drowsy, or dizzy at home. You fall at home. Get help right away if you: Lose consciousness or have trouble moving after a fall. Have a fall that causes a head injury. These symptoms may be an emergency. Get help right away. Call 911. Do not wait to see if the symptoms will go away. Do not drive yourself to the hospital. This information is not intended to replace advice given to you by your health care provider. Make sure you discuss any questions you have with your health care provider. Document Revised: 11/18/2021 Document Reviewed: 11/18/2021 Elsevier Patient Education  2024 [employer].

## 2023-06-17 ENCOUNTER — Ambulatory Visit: Attending: Nurse Practitioner

## 2023-06-17 DIAGNOSIS — R079 Chest pain, unspecified: Secondary | ICD-10-CM

## 2023-06-17 LAB — CMP14+EGFR
ALT: 22 IU/L (ref 0–32)
AST: 23 IU/L (ref 0–40)
Albumin: 4.3 g/dL (ref 3.9–4.9)
Alkaline Phosphatase: 123 IU/L — ABNORMAL HIGH (ref 44–121)
BUN/Creatinine Ratio: 20 (ref 12–28)
BUN: 15 mg/dL (ref 8–27)
Bilirubin Total: 0.3 mg/dL (ref 0.0–1.2)
CO2: 24 mmol/L (ref 20–29)
Calcium: 9.8 mg/dL (ref 8.7–10.3)
Chloride: 104 mmol/L (ref 96–106)
Creatinine, Ser: 0.76 mg/dL (ref 0.57–1.00)
Globulin, Total: 2.4 g/dL (ref 1.5–4.5)
Glucose: 90 mg/dL (ref 70–99)
Potassium: 4.4 mmol/L (ref 3.5–5.2)
Sodium: 145 mmol/L — ABNORMAL HIGH (ref 134–144)
Total Protein: 6.7 g/dL (ref 6.0–8.5)
eGFR: 86 mL/min/{1.73_m2} (ref >59)

## 2023-06-17 LAB — LIPID PANEL
Chol/HDL Ratio: 2.2 ratio (ref 0.0–4.4)
Cholesterol, Total: 154 mg/dL (ref 100–199)
HDL: 69 mg/dL (ref >39)
LDL Chol Calc (NIH): 71 mg/dL (ref 0–99)
Triglycerides: 70 mg/dL (ref 0–149)
VLDL Cholesterol Cal: 14 mg/dL (ref 5–40)

## 2023-06-17 LAB — ECHOCARDIOGRAM COMPLETE
AR max vel: 2.63 cm2
AV Area VTI: 2.51 cm2
AV Area mean vel: 2.69 cm2
AV Mean grad: 4 mmHg
AV Peak grad: 8.1 mmHg
AV Vena cont: 0.5 cm
Ao pk vel: 1.42 m/s
Area-P 1/2: 4.1 cm2
Calc EF: 59.7 %
MV VTI: 3.09 cm2
P 1/2 time: 420 ms
S' Lateral: 2.5 cm
Single Plane A2C EF: 65.6 %
Single Plane A4C EF: 59.6 %

## 2023-06-17 LAB — CBC WITH DIFFERENTIAL/PLATELET
Basophils Absolute: 0 x10E3/uL (ref 0.0–0.2)
Basos: 1 %
EOS (ABSOLUTE): 0.1 x10E3/uL (ref 0.0–0.4)
Eos: 2 %
Hematocrit: 43.3 % (ref 34.0–46.6)
Hemoglobin: 14.2 g/dL (ref 11.1–15.9)
Immature Grans (Abs): 0 x10E3/uL (ref 0.0–0.1)
Immature Granulocytes: 0 %
Lymphocytes Absolute: 2.3 x10E3/uL (ref 0.7–3.1)
Lymphs: 38 %
MCH: 31.8 pg (ref 26.6–33.0)
MCHC: 32.8 g/dL (ref 31.5–35.7)
MCV: 97 fL (ref 79–97)
Monocytes Absolute: 0.4 x10E3/uL (ref 0.1–0.9)
Monocytes: 7 %
Neutrophils Absolute: 3.1 x10E3/uL (ref 1.4–7.0)
Neutrophils: 52 %
Platelets: 186 x10E3/uL (ref 150–450)
RBC: 4.46 x10E6/uL (ref 3.77–5.28)
RDW: 12.8 % (ref 11.7–15.4)
WBC: 6 x10E3/uL (ref 3.4–10.8)

## 2023-06-20 ENCOUNTER — Other Ambulatory Visit: Payer: Self-pay | Admitting: Nurse Practitioner

## 2023-06-20 DIAGNOSIS — R7303 Prediabetes: Secondary | ICD-10-CM

## 2023-06-21 ENCOUNTER — Other Ambulatory Visit: Payer: Self-pay | Admitting: Nurse Practitioner

## 2023-06-21 ENCOUNTER — Other Ambulatory Visit: Payer: Self-pay | Admitting: Cardiology

## 2023-06-21 DIAGNOSIS — B009 Herpesviral infection, unspecified: Secondary | ICD-10-CM

## 2023-06-22 ENCOUNTER — Other Ambulatory Visit: Payer: Self-pay | Admitting: Nurse Practitioner

## 2023-06-22 DIAGNOSIS — B009 Herpesviral infection, unspecified: Secondary | ICD-10-CM

## 2023-06-30 ENCOUNTER — Telehealth: Payer: Self-pay | Admitting: Nurse Practitioner

## 2023-06-30 ENCOUNTER — Ambulatory Visit: Attending: Nurse Practitioner

## 2023-06-30 ENCOUNTER — Other Ambulatory Visit: Payer: Self-pay | Admitting: Nurse Practitioner

## 2023-06-30 DIAGNOSIS — R002 Palpitations: Secondary | ICD-10-CM

## 2023-06-30 NOTE — Telephone Encounter (Signed)
 Checking percert on the following   7 day xt zio monitor mailed to patient

## 2023-07-09 ENCOUNTER — Ambulatory Visit: Admitting: Nurse Practitioner

## 2023-07-15 ENCOUNTER — Ambulatory Visit: Admitting: Nurse Practitioner

## 2023-07-15 ENCOUNTER — Ambulatory Visit: Payer: Self-pay | Admitting: *Deleted

## 2023-07-15 NOTE — Telephone Encounter (Signed)
  Chief Complaint: weakness- all over weakness when patient got up today Symptoms: weakness- daughter states patient was able to stand and walk, low BP- daughter states this is normal occurrence and patient uses "salt pills " for that. Daughter states patient is sleeping now and she does not want to disturb her for triage- appointment has been scheduled for weakness- and daughter has been advised ED if patient has worsening/additional symptoms.  Frequency: today Pertinent Negatives: Patient denies numbness- other symptoms Disposition: [] ED /[] Urgent Care (no appt availability in office) / [x] Appointment(In office/virtual)/ []  [facility]/ [] Home Care/ [] Refused Recommended Disposition /[] [facility]/ []  Follow-up with PCP Additional Notes: Patient's daughter is calling- she is concerned about changes in her mother's status- patient is sleeping- unable to ask questions directly.    Copied from CRM [id number]. Topic: Clinical - Red Word Triage >> Jul 15, 2023  1:24 PM Baldemar Lev wrote: Red Word that prompted transfer to Nurse Triage: Left side very weak, no numbness reported, hindering her walking. Reason for Disposition . [1] MODERATE weakness (i.e., interferes with work, school, normal activities) AND [2] cause unknown  (Exceptions: Weakness from acute minor illness or poor fluid intake; weakness is chronic and not worse.)  Answer Assessment - Initial Assessment Questions 1. SYMPTOM: "What is the main symptom you are concerned about?" (e.g., weakness, numbness)   Weakness- all over  Answer Assessment - Initial Assessment Questions 1. DESCRIPTION: "Describe how you are feeling."     All over weakness 2. SEVERITY: "How bad is it?"  "Can you stand and walk?"   - MILD (0-3): Feels weak or tired, but does not interfere with work, school or normal activities.   - MODERATE (4-7): Able to stand and walk; weakness interferes with work, school, or normal activities.   - SEVERE  (8-10): Unable to stand or walk; unable to do usual activities.     mild 3. ONSET: "When did these symptoms begin?" (e.g., hours, days, weeks, months)     1:00 4. CAUSE: "What do you think is causing the weakness or fatigue?" (e.g., not drinking enough fluids, medical problem, trouble sleeping)     States patient is having "Severe blackout" - BP 90/70 P77- declines to recheck -patient is sleeping- other sibling has arrived and they are discussing the patient symptoms- scared the daughter that called.  5. NEW MEDICINES:  "Have you started on any new medicines recently?" (e.g., opioid pain medicines, benzodiazepines, muscle relaxants, antidepressants, antihistamines, neuroleptics, beta blockers)     Uses salt to bring BP back up 6. OTHER SYMPTOMS: "Do you have any other symptoms?" (e.g., chest pain, fever, cough, SOB, vomiting, diarrhea, bleeding, other areas of pain)     no  Protocols used: Weakness (Generalized) and Fatigue-A-AH, Neurologic Deficit-A-AH

## 2023-07-20 ENCOUNTER — Encounter: Payer: Self-pay | Admitting: Nurse Practitioner

## 2023-07-21 ENCOUNTER — Telehealth (INDEPENDENT_AMBULATORY_CARE_PROVIDER_SITE_OTHER): Admitting: Nurse Practitioner

## 2023-07-21 ENCOUNTER — Ambulatory Visit: Payer: Self-pay

## 2023-07-21 ENCOUNTER — Encounter: Payer: Self-pay | Admitting: Nurse Practitioner

## 2023-07-21 ENCOUNTER — Encounter: Payer: Self-pay | Admitting: Sports Medicine

## 2023-07-21 DIAGNOSIS — B3731 Acute candidiasis of vulva and vagina: Secondary | ICD-10-CM

## 2023-07-21 MED ORDER — FLUCONAZOLE 150 MG PO TABS: ORAL_TABLET | ORAL | 0 refills | Status: DC

## 2023-07-21 NOTE — Patient Instructions (Signed)

## 2023-07-21 NOTE — Progress Notes (Signed)
 Virtual Visit Consent   [name], you are scheduled for a virtual visit with [name] Gaylyn Keas, FNP, a [facility] provider, today.     Just as with appointments in the office, your consent must be obtained to participate.  Your consent will be active for this visit and any virtual visit you may have with one of our providers in the next 365 days.     If you have a MyChart account, a copy of this consent can be sent to you electronically.  All virtual visits are billed to your insurance company just like a traditional visit in the office.    As this is a virtual visit, video technology does not allow for your provider to perform a traditional examination.  This may limit your provider's ability to fully assess your condition.  If your provider identifies any concerns that need to be evaluated in person or the need to arrange testing (such as labs, EKG, etc.), we will make arrangements to do so.     Although advances in technology are sophisticated, we cannot ensure that it will always work on either your end or our end.  If the connection with a video visit is poor, the visit may have to be switched to a telephone visit.  With either a video or telephone visit, we are not always able to ensure that we have a secure connection.     I need to obtain your verbal consent now.   Are you willing to proceed with your visit today? YES   AMEL KITCH has provided verbal consent on 07/21/2023 for a virtual visit (video or telephone).   [name] Gaylyn Keas, FNP   Date: 07/21/2023 12:06 PM   Virtual Visit via Video Note   I, [name] Gaylyn Keas, connected with TAELAR GRONEWOLD ([id number], February 08, 1956) on 07/21/23 at 12:45 PM EDT by a video-enabled telemedicine application and verified that I am speaking with the correct person using two identifiers.  Location: Patient: Virtual Visit Location Patient: Home Provider: Virtual Visit Location Provider: Mobile   I discussed the  limitations of evaluation and management by telemedicine and the availability of in person appointments. The patient expressed understanding and agreed to proceed.    History of Present Illness: JOLEA DOLLE is a 68 y.o. who identifies as a female who was assigned female at birth, and is being seen today for yeast infection.  HPI: Patient says she has burning and itching in perineal area and lots of itching. Has tried monistat  OTC    Review of Systems  Genitourinary:  Negative for dysuria, frequency and urgency.    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   Hypokalemia 11/25/2020   S/P laparoscopic fundoplication 10/31/2020   Status post total replacement of left hip 01/31/2020   Unilateral primary osteoarthritis, left hip 12/21/2019   S/P shoulder replacement, right 11/15/2019   Overweight (BMI 25.0-29.9) 04/15/2019   Diverticulosis 04/14/2019   Chronic diastolic CHF (congestive heart failure) (HCC) 04/14/2019   Unstable angina (HCC) 10/20/2017   Chronic pain 09/23/2016   Pre-diabetes 09/02/2016   Recurrent major depressive disorder, in partial remission (HCC) 07/14/2016   Seizures (HCC) 07/14/2016   GAD (generalized anxiety disorder) 07/14/2016   Insomnia  09/05/2014   Allergic rhinitis 09/05/2014   Cervical spondylosis without myelopathy 12/19/2013    Class: Chronic   Neural foraminal stenosis of cervical spine 12/19/2013   Cervical radiculitis 09/19/2013   Lumbosacral spondylosis without myelopathy 11/16/2012   Postlaminectomy syndrome, lumbar region 11/16/2012   Herpes simplex virus (HSV) infection 10/31/2008   Hyperlipidemia with target LDL less than 100 10/31/2008   Primary hypertension  10/31/2008   GERD 10/31/2008   SPINAL STENOSIS OF LUMBAR REGION 10/31/2008   Myalgia 10/31/2008   Osteoporosis 10/31/2008   SPONDYLOLISTHESIS 10/31/2008   SYNCOPE 10/31/2008   Sleep apnea 10/31/2008    Allergies:  Allergies  Allergen Reactions   Codeine  Other (See Comments)    "I will have a heart attack."   Morphine And Codeine  Other (See Comments)    "It will cause me to have a heart attack."   Ambien  [Zolpidem  Tartrate] Nausea And Vomiting   Clonidine  Derivatives Nausea And Vomiting    gerd - caused acid reflux per pt   Metformin  And Related Nausea And Vomiting    cramping from metformin    Lyrica  [Pregabalin ] Swelling and Other (See Comments)    Weight gain   Neurontin [Gabapentin] Other (See Comments)    Causes elevated LFTs    Medications:  Current Outpatient Medications:    Accu-Chek Softclix Lancets lancets, test blood sugar up to 4 times a day as directed. Dx R73.03, Disp: 400 each, Rfl: 3   acyclovir  ointment (ZOVIRAX ) 5 %, apply 1 application topically every 3 (three) hours as needed (outbreaks)., Disp: 30 g, Rfl: 0   albuterol  (VENTOLIN  HFA) 108 (90 Base) MCG/ACT inhaler, inhale 2 puffs every 6 hours as needed for shortness of breath and wheezing., Disp: 18 g, Rfl: 0   Alcohol  Swabs  (B-D SINGLE USE SWABS  REGULAR) PADS, USE 1 PAD DAILY WHEN CHECKING BLOOD SUGAR. R73.03, Disp: 100 each, Rfl: 3   aspirin  EC 325 MG tablet, Take 1 tablet (325 mg total) by mouth daily., Disp: 30 tablet, Rfl: 0   benzonatate  (TESSALON ) 200 MG capsule, Take 1 capsule (200 mg total) by mouth 3 (three) times daily as needed., Disp: 30 capsule, Rfl: 1   blood glucose meter kit and supplies, Dispense based on patient and insurance preference. Use up to four times daily as directed. (FOR ICD-10 E10.9, E11.9)., Disp: 1 each, Rfl: 0   busPIRone  (BUSPAR ) 10 MG tablet, Take 1 tablet (10 mg total) by mouth 2 (two) times daily as needed., Disp: 60 tablet, Rfl: 5   butalbital -acetaminophen -caffeine   (FIORICET ) 50-325-40 MG tablet, take 1 tablet by mouth every 6 (six) hours as needed for headache., Disp: 20 tablet, Rfl: 0   carvedilol  (COREG ) 3.125 MG tablet, TAKE 1/2 (0.5) TABLET TWICE A DAY WITH A MEAL; BLOOD PRESSURE OVER 150 BEFORE TAKING., Disp: 90 tablet, Rfl: 1   celecoxib  (CELEBREX ) 200 MG capsule, Take 1 capsule (200 mg total) by mouth daily as needed., Disp: 60 capsule, Rfl: 0   dexlansoprazole  (DEXILANT ) 60 MG capsule, Take 1 capsule (60 mg total) by mouth daily., Disp: 90 capsule, Rfl: 1   Dextromethorphan-GG-APAP (CORICIDIN HBP COLD/COUGH/FLU) 10-200-325 MG/15ML LIQD, Take 30 mLs by mouth every 4 (four) hours as needed (cough)., Disp: 355 mL, Rfl: 0   diclofenac  Sodium (VOLTAREN ) 1 % GEL, APPLY 4 GRAMS TOPICALLY 4 TIMES A DAY. (Patient taking differently: Apply 4 g topically 4 (four) times daily as needed (pain).), Disp: 100 g, Rfl: 0   DULoxetine  (CYMBALTA ) 60 MG capsule, Take 1 capsule (60 mg total) by  mouth at bedtime., Disp: 90 capsule, Rfl: 1   empagliflozin  (JARDIANCE ) 25 MG TABS tablet, Take 1 tablet (25 mg total) by mouth daily., Disp: 90 tablet, Rfl: 1   escitalopram  (LEXAPRO ) 20 MG tablet, Take 1 tablet (20 mg total) by mouth daily., Disp: 90 tablet, Rfl: 1   estradiol  (ESTRACE ) 0.1 MG/GM vaginal cream, Place 1 Applicatorful vaginally 3 (three) times a week., Disp: 42.5 g, Rfl: 3   FIBER PO, Take 1 tablet by mouth in the morning and at bedtime., Disp: , Rfl:    fludrocortisone  (FLORINEF ) 0.1 MG tablet, take 1 tablet (100mcg total) by mouth every other day., Disp: 15 tablet, Rfl: 0   fluticasone  (FLONASE ) 50 MCG/ACT nasal spray, USE 1 SPRAY IN EACH NOSTRIL ONCE DAILY. (Patient taking differently: Place 1 spray into both nostrils daily as needed for allergies.), Disp: 16 g, Rfl: 5   fluticasone  furoate-vilanterol (BREO ELLIPTA ) 100-25 MCG/ACT AEPB, Inhale 1 puff by mouth once daily, Disp: 60 each, Rfl: 1   furosemide  (LASIX ) 40 MG tablet, TAKE 1 TABLET DAILY AS NEEDED FOR  EXCESSIVE FLUID., Disp: 90 tablet, Rfl: 1   Glucagon , rDNA, (GLUCAGON  EMERGENCY) 1 MG KIT, Inject 1 mg into the skin as needed., Disp: 1 kit, Rfl: 5   glucose blood (ACCU-CHEK GUIDE) test strip, TEST BLOOD SUGAR UP TO 4 TIMES A DAY AS DIRECTED. R73.03, Disp: 400 each, Rfl: 3   hydrALAZINE  (APRESOLINE ) 25 MG tablet, take 1 tablet by mouth 3 times daily as needed (for severe hypertension/systolic number 170 or greater)., Disp: 90 tablet, Rfl: 2   [START ON 08/14/2023] HYDROcodone -acetaminophen  (NORCO) 7.5-325 MG tablet, Take 1 tablet by mouth in the morning, at noon, and at bedtime., Disp: 90 tablet, Rfl: 0   HYDROcodone -acetaminophen  (NORCO) 7.5-325 MG tablet, Take 1 tablet by mouth in the morning, at noon, and at bedtime., Disp: 90 tablet, Rfl: 0   ipratropium (ATROVENT ) 0.02 % nebulizer solution, USE 1 VIAL ( ) IN NEBULIZER EVERY 6 HOURS AS NEEDED FOR WHEEZING OR SHORTNESS OF BREATH., Disp: 150 mL, Rfl: 2   isosorbide  mononitrate (IMDUR ) 60 MG 24 hr tablet, Take 1 tablet (60 mg total) by mouth 2 (two) times daily., Disp: 60 tablet, Rfl: 6   lamoTRIgine  (LAMICTAL ) 150 MG tablet, Take 1 tablet (150 mg total) by mouth daily., Disp: 90 tablet, Rfl: 1   levETIRAcetam  (KEPPRA ) 500 MG tablet, Take 1 tablet (500 mg total) by mouth 2 (two) times daily., Disp: 180 tablet, Rfl: 1   levocetirizine (XYZAL ) 5 MG tablet, take 1 tablet by mouth every morning., Disp: 30 tablet, Rfl: 5   lidocaine  (LIDODERM ) 5 %, place 1 patch onto the skin daily. remove & discard patch within 12 hours or as directed by md, Disp: 10 patch, Rfl: 2   lidocaine  (XYLOCAINE ) 5 % ointment, apply to affected area 3 times a day as needed for mild or moderate pain., Disp: 240 g, Rfl: 0   methocarbamol  (ROBAXIN ) 500 MG tablet, take 1 tablet (500 milligram total) by mouth 4 (four) times daily., Disp: 30 tablet, Rfl: 0   naproxen  (NAPROSYN ) 500 MG tablet, Take 1 tablet (500 mg total) by mouth 2 (two) times daily., Disp: 30 tablet, Rfl: 0    nitroGLYCERIN  (NITROSTAT ) 0.4 MG SL tablet, place one (1) tablet under tongue every 5 minutes up to (3) doses as needed for chest pain., Disp: 25 tablet, Rfl: 0   ondansetron  (ZOFRAN ) 4 MG tablet, take 1 tablet by mouth every 8 hours as needed for nausea and vomiting.,  Disp: 20 tablet, Rfl: 0   oxybutynin  (DITROPAN -XL) 5 MG 24 hr tablet, Take 1 tablet (5 mg total) by mouth at bedtime., Disp: 90 tablet, Rfl: 1   Probiotic Product (PROBIOTIC PO), Take 1 capsule by mouth in the morning., Disp: , Rfl:    promethazine -dextromethorphan (PROMETHAZINE -DM) 6.25-15 MG/5ML syrup, Take 5 mLs by mouth 3 (three) times daily as needed for cough., Disp: 118 mL, Rfl: 0   rosuvastatin  (CRESTOR ) 10 MG tablet, Take 1 tablet (10 mg total) by mouth daily., Disp: 90 tablet, Rfl: 1   traZODone  (DESYREL ) 150 MG tablet, Take 1 tablet (150 mg total) by mouth at bedtime., Disp: 90 tablet, Rfl: 1   valACYclovir  (VALTREX ) 1000 MG tablet, Take 1 tablet (1,000 mg total) by mouth 3 (three) times daily as needed (with active outbreaks.)., Disp: 21 tablet, Rfl: 0   valACYclovir  (VALTREX ) 500 MG tablet, TAKE (1) TABLET TWICE DAILY., Disp: 180 tablet, Rfl: 1  Observations/Objective: Patient is well-developed, well-nourished in no acute distress.  Resting comfortably  at home.  Head is normocephalic, atraumatic.  No labored breathing.  Speech is clear and coherent with logical content.  Patient is alert and oriented at baseline.    Assessment and Plan:  DOTTY GONZALO in today with chief complaint of No chief complaint on file.   1. Vaginal candidiasis (Primary) No bubble baths Monistat  OTC for itching Meds ordered this encounter  Medications   fluconazole  (DIFLUCAN ) 150 MG tablet    Sig: 1 po q week x 4 weeks    Dispense:  4 tablet    Refill:  0    Supervising Provider:   Jolyne Needs A [[id number]]       Follow Up Instructions: I discussed the assessment and treatment plan with the patient. The patient was  provided an opportunity to ask questions and all were answered. The patient agreed with the plan and demonstrated an understanding of the instructions.  A copy of instructions were sent to the patient via MyChart.  The patient was advised to call back or seek an in-person evaluation if the symptoms worsen or if the condition fails to improve as anticipated.  Time:  I spent 5 minutes with the patient via telehealth technology discussing the above problems/concerns.    [name] Gaylyn Keas, FNP

## 2023-07-21 NOTE — Telephone Encounter (Signed)
 This RN made second attempt to triage patient. No answer, LVM. Will route for additional attempts.

## 2023-07-21 NOTE — Telephone Encounter (Signed)
 This RN made third and final attempt to triage patient. No answer, LVM. Routing to office for follow-up.

## 2023-07-21 NOTE — Telephone Encounter (Signed)
 This RN made first attempt to triage patient. No answer, LVM. Will route for additional attempts.   Copied from CRM [id number]. Topic: Clinical - Medical Advice >> Jul 21, 2023  8:14 AM [name] T wrote: Reason for CRM: patient called stated she is having burning, itching, pain and dry feeling in her private area. She has used Monistat  but it just burn her more. Patient wanted to be seen today but no appt so she is asking for a medication for the symptoms.

## 2023-07-22 ENCOUNTER — Other Ambulatory Visit: Payer: Self-pay | Admitting: Nurse Practitioner

## 2023-07-22 DIAGNOSIS — B009 Herpesviral infection, unspecified: Secondary | ICD-10-CM

## 2023-07-23 ENCOUNTER — Other Ambulatory Visit: Payer: Self-pay | Admitting: Nurse Practitioner

## 2023-07-24 ENCOUNTER — Emergency Department (HOSPITAL_COMMUNITY)

## 2023-07-24 ENCOUNTER — Emergency Department (HOSPITAL_COMMUNITY)
Admission: EM | Admit: 2023-07-24 | Discharge: 2023-07-24 | Disposition: A | Discharge status: Home / Self Care | Source: Home / Self Care | Attending: Emergency Medicine | Admitting: Emergency Medicine

## 2023-07-24 ENCOUNTER — Other Ambulatory Visit: Payer: Self-pay

## 2023-07-24 DIAGNOSIS — K5730 Diverticulosis of large intestine without perforation or abscess without bleeding: Secondary | ICD-10-CM | POA: Diagnosis not present

## 2023-07-24 DIAGNOSIS — Z96642 Presence of left artificial hip joint: Secondary | ICD-10-CM | POA: Diagnosis not present

## 2023-07-24 DIAGNOSIS — Z8249 Family history of ischemic heart disease and other diseases of the circulatory system: Secondary | ICD-10-CM | POA: Diagnosis not present

## 2023-07-24 DIAGNOSIS — I110 Hypertensive heart disease with heart failure: Secondary | ICD-10-CM | POA: Diagnosis not present

## 2023-07-24 DIAGNOSIS — Z7982 Long term (current) use of aspirin: Secondary | ICD-10-CM | POA: Diagnosis not present

## 2023-07-24 DIAGNOSIS — K219 Gastro-esophageal reflux disease without esophagitis: Secondary | ICD-10-CM | POA: Diagnosis not present

## 2023-07-24 DIAGNOSIS — E785 Hyperlipidemia, unspecified: Secondary | ICD-10-CM | POA: Diagnosis not present

## 2023-07-24 DIAGNOSIS — Z7984 Long term (current) use of oral hypoglycemic drugs: Secondary | ICD-10-CM | POA: Diagnosis not present

## 2023-07-24 DIAGNOSIS — K5732 Diverticulitis of large intestine without perforation or abscess without bleeding: Secondary | ICD-10-CM | POA: Insufficient documentation

## 2023-07-24 DIAGNOSIS — I10 Essential (primary) hypertension: Secondary | ICD-10-CM | POA: Diagnosis not present

## 2023-07-24 DIAGNOSIS — E119 Type 2 diabetes mellitus without complications: Secondary | ICD-10-CM | POA: Diagnosis not present

## 2023-07-24 DIAGNOSIS — K5792 Diverticulitis of intestine, part unspecified, without perforation or abscess without bleeding: Secondary | ICD-10-CM | POA: Diagnosis not present

## 2023-07-24 DIAGNOSIS — K5900 Constipation, unspecified: Secondary | ICD-10-CM | POA: Diagnosis not present

## 2023-07-24 DIAGNOSIS — M4800 Spinal stenosis, site unspecified: Secondary | ICD-10-CM | POA: Diagnosis not present

## 2023-07-24 DIAGNOSIS — I2510 Atherosclerotic heart disease of native coronary artery without angina pectoris: Secondary | ICD-10-CM | POA: Diagnosis not present

## 2023-07-24 DIAGNOSIS — R109 Unspecified abdominal pain: Secondary | ICD-10-CM | POA: Diagnosis not present

## 2023-07-24 DIAGNOSIS — K449 Diaphragmatic hernia without obstruction or gangrene: Secondary | ICD-10-CM | POA: Diagnosis not present

## 2023-07-24 DIAGNOSIS — M797 Fibromyalgia: Secondary | ICD-10-CM | POA: Diagnosis not present

## 2023-07-24 DIAGNOSIS — I252 Old myocardial infarction: Secondary | ICD-10-CM | POA: Diagnosis not present

## 2023-07-24 DIAGNOSIS — Z96611 Presence of right artificial shoulder joint: Secondary | ICD-10-CM | POA: Diagnosis not present

## 2023-07-24 DIAGNOSIS — I5032 Chronic diastolic (congestive) heart failure: Secondary | ICD-10-CM | POA: Diagnosis not present

## 2023-07-24 DIAGNOSIS — Z79899 Other long term (current) drug therapy: Secondary | ICD-10-CM | POA: Diagnosis not present

## 2023-07-24 DIAGNOSIS — M1990 Unspecified osteoarthritis, unspecified site: Secondary | ICD-10-CM | POA: Diagnosis not present

## 2023-07-24 DIAGNOSIS — J4489 Other specified chronic obstructive pulmonary disease: Secondary | ICD-10-CM | POA: Diagnosis not present

## 2023-07-24 DIAGNOSIS — R1032 Left lower quadrant pain: Secondary | ICD-10-CM | POA: Diagnosis not present

## 2023-07-24 DIAGNOSIS — G4700 Insomnia, unspecified: Secondary | ICD-10-CM | POA: Diagnosis not present

## 2023-07-24 LAB — COMPREHENSIVE METABOLIC PANEL WITH GFR
ALT: 16 U/L (ref 0–44)
AST: 20 U/L (ref 15–41)
Albumin: 3.9 g/dL (ref 3.5–5.0)
Alkaline Phosphatase: 97 U/L (ref 38–126)
Anion gap: 12 (ref 5–15)
BUN: 13 mg/dL (ref 8–23)
CO2: 26 mmol/L (ref 22–32)
Calcium: 9.3 mg/dL (ref 8.9–10.3)
Chloride: 99 mmol/L (ref 98–111)
Creatinine, Ser: 0.89 mg/dL (ref 0.44–1.00)
GFR, Estimated: >60 mL/min (ref >60)
Glucose, Bld: 115 mg/dL — ABNORMAL HIGH (ref 70–99)
Potassium: 3.7 mmol/L (ref 3.5–5.1)
Sodium: 137 mmol/L (ref 135–145)
Total Bilirubin: 0.6 mg/dL (ref 0.0–1.2)
Total Protein: 7.4 g/dL (ref 6.5–8.1)

## 2023-07-24 LAB — CBC WITH DIFFERENTIAL/PLATELET
Abs Immature Granulocytes: 0.03 10*3/uL (ref 0.00–0.07)
Basophils Absolute: 0 10*3/uL (ref 0.0–0.1)
Basophils Relative: 0 %
Eosinophils Absolute: 0.1 10*3/uL (ref 0.0–0.5)
Eosinophils Relative: 1 %
HCT: 46.1 % — ABNORMAL HIGH (ref 36.0–46.0)
Hemoglobin: 14.9 g/dL (ref 12.0–15.0)
Immature Granulocytes: 0 %
Lymphocytes Relative: 15 %
Lymphs Abs: 1.6 10*3/uL (ref 0.7–4.0)
MCH: 31.6 pg (ref 26.0–34.0)
MCHC: 32.3 g/dL (ref 30.0–36.0)
MCV: 97.7 fL (ref 80.0–100.0)
Monocytes Absolute: 1.1 10*3/uL — ABNORMAL HIGH (ref 0.1–1.0)
Monocytes Relative: 10 %
Neutro Abs: 7.8 10*3/uL — ABNORMAL HIGH (ref 1.7–7.7)
Neutrophils Relative %: 74 %
Platelets: 160 10*3/uL (ref 150–400)
RBC: 4.72 MIL/uL (ref 3.87–5.11)
RDW: 12.8 % (ref 11.5–15.5)
WBC: 10.5 10*3/uL (ref 4.0–10.5)
nRBC: 0 % (ref 0.0–0.2)

## 2023-07-24 LAB — LIPASE, BLOOD: Lipase: 24 U/L (ref 11–51)

## 2023-07-24 MED ORDER — KETOROLAC TROMETHAMINE 15 MG/ML IJ SOLN
15.0000 mg | Freq: Once | INTRAMUSCULAR | Status: AC
  Administered 2023-07-24: 15 mg via INTRAVENOUS
  Filled 2023-07-24: qty 1

## 2023-07-24 MED ORDER — ONDANSETRON HCL 4 MG/2ML IJ SOLN
4.0000 mg | Freq: Once | INTRAMUSCULAR | Status: AC
  Administered 2023-07-24: 4 mg via INTRAVENOUS
  Filled 2023-07-24: qty 2

## 2023-07-24 MED ORDER — HYDROMORPHONE HCL 1 MG/ML IJ SOLN
0.5000 mg | Freq: Once | INTRAMUSCULAR | Status: AC
Start: 2023-07-24 — End: 2023-07-24
  Administered 2023-07-24: 0.5 mg via INTRAVENOUS
  Filled 2023-07-24: qty 0.5

## 2023-07-24 MED ORDER — AMOXICILLIN-POT CLAVULANATE 875-125 MG PO TABS: 1.0000 | ORAL_TABLET | Freq: Two times a day (BID) | ORAL | 0 refills | Status: DC

## 2023-07-24 MED ORDER — IOHEXOL 300 MG/ML  SOLN
100.0000 mL | Freq: Once | INTRAMUSCULAR | Status: AC | PRN
  Administered 2023-07-24: 100 mL via INTRAVENOUS

## 2023-07-24 MED ORDER — METOCLOPRAMIDE HCL 10 MG PO TABS: 10.0000 mg | ORAL_TABLET | Freq: Four times a day (QID) | ORAL | 0 refills | Status: DC

## 2023-07-24 NOTE — ED Triage Notes (Signed)
 Pt c/o abdm pain, onset 4 days ago; symptoms began after treatment for a yeast infection began; hx of diverticulitis, pt states "it feels the same but I don't have the diarrhea with it." Pt states pain,10/10, constant without relief, even with rx pain meds.pt states she hasn't eaten for 2 days; limited fluid intake for same amount of time d/t N/V. Pt states vomiting 4 times, no emesis.

## 2023-07-24 NOTE — ED Provider Notes (Signed)
 [place] EMERGENCY DEPARTMENT AT [facility] Provider Note   CSN: [id number] Arrival date & time: 07/24/23  1191     History  Chief Complaint  Patient presents with   Abdominal Pain    ABBIEGALE [name] is a 68 y.o. female.  68 year old female with history of diverticulitis who presents to the emergency department for lower abdominal pain and nausea and vomiting.  Patient reports that on Tuesday she started experiencing lower abdominal pain.  Says that it is constant.  Burning sensation.  Says that she started developing nausea and vomiting since then.  Has been trying a muscle laxer and hydrocodone  for it without relief.  No diarrhea or constipation.  Started shortly after she was given a medication for a yeast infection.  Denies any vaginal discharge.  Sexually active but no concern for STIs currently.  No abdominal surgeries.       Home Medications Prior to Admission medications   Medication Sig Start Date End Date Taking? Authorizing Provider  amoxicillin -clavulanate (AUGMENTIN ) 875-125 MG tablet Take 1 tablet by mouth every 12 (twelve) hours. 07/24/23  Yes Ninetta Basket, MD  metoCLOPramide  (REGLAN ) 10 MG tablet Take 1 tablet (10 mg total) by mouth every 6 (six) hours. 07/24/23  Yes Ninetta Basket, MD  Accu-Chek Softclix Lancets lancets test blood sugar up to 4 times a day as directed. Dx R73.03 03/02/23   Delfina Feller, FNP  acyclovir  ointment (ZOVIRAX ) 5 % apply 1 application topically every 3 (three) hours as needed (outbreaks). 07/23/23   Gaylyn Keas [name], FNP  albuterol  (VENTOLIN  HFA) 108 (90 Base) MCG/ACT inhaler inhale 2 puffs every 6 hours as needed for shortness of breath and wheezing. 04/16/23   Delfina Feller, FNP  Alcohol  Swabs  (B-D SINGLE USE SWABS  REGULAR) PADS USE 1 PAD DAILY WHEN CHECKING BLOOD SUGAR. R73.03 06/26/22   Gaylyn Keas, [name], FNP  aspirin  EC 325 MG tablet Take 1 tablet (325 mg total) by mouth daily. 09/18/22    Wilhelmenia Harada, MD  benzonatate  (TESSALON ) 200 MG capsule Take 1 capsule (200 mg total) by mouth 3 (three) times daily as needed. 03/30/23   Yevette Hem, FNP  blood glucose meter kit and supplies Dispense based on patient and insurance preference. Use up to four times daily as directed. (FOR ICD-10 E10.9, E11.9). 02/04/21   Gaylyn Keas [name], FNP  busPIRone  (BUSPAR ) 10 MG tablet Take 1 tablet (10 mg total) by mouth 2 (two) times daily as needed. 06/16/23   Delfina Feller, FNP  butalbital -acetaminophen -caffeine  (FIORICET ) 50-325-40 MG tablet take 1 tablet by mouth every 6 (six) hours as needed for headache. 07/23/23   Gaylyn Keas, [name], FNP  carvedilol  (COREG ) 3.125 MG tablet TAKE 1/2 (0.5) TABLET TWICE A DAY WITH A MEAL; BLOOD PRESSURE OVER 150 BEFORE TAKING. 06/16/23   Gaylyn Keas [name], FNP  celecoxib  (CELEBREX ) 200 MG capsule Take 1 capsule (200 mg total) by mouth daily as needed. 06/16/23   Gaylyn Keas [name], FNP  dexlansoprazole  (DEXILANT ) 60 MG capsule Take 1 capsule (60 mg total) by mouth daily. 06/16/23   Gaylyn Keas, [name], FNP  Dextromethorphan-GG-APAP (CORICIDIN HBP COLD/COUGH/FLU) 10-200-325 MG/15ML LIQD Take 30 mLs by mouth every 4 (four) hours as needed (cough). 02/03/23   Milian, Winda Hastings, FNP  diclofenac  Sodium (VOLTAREN ) 1 % GEL APPLY 4 GRAMS TOPICALLY 4 TIMES A DAY. Patient taking differently: Apply 4 g topically 4 (four) times daily as needed (pain). 03/01/21   Gaylyn Keas [name], FNP  DULoxetine  (CYMBALTA ) 60 MG capsule Take 1 capsule (60 mg total) by mouth  at bedtime. 06/16/23   Gaylyn Keas [name], FNP  empagliflozin  (JARDIANCE ) 25 MG TABS tablet Take 1 tablet (25 mg total) by mouth daily. 06/16/23   Gaylyn Keas [name], FNP  escitalopram  (LEXAPRO ) 20 MG tablet Take 1 tablet (20 mg total) by mouth daily. 06/16/23   Gaylyn Keas, [name], FNP  estradiol  (ESTRACE ) 0.1 MG/GM vaginal cream Place 1 Applicatorful vaginally 3 (three) times a week.  03/20/23   Gaylyn Keas, [name], FNP  FIBER PO Take 1 tablet by mouth in the morning and at bedtime.    [provider]  fluconazole  (DIFLUCAN ) 150 MG tablet 1 po q week x 4 weeks 07/21/23   Delfina Feller, FNP  fludrocortisone  (FLORINEF ) 0.1 MG tablet take 1 tablet (100mcg total) by mouth every other day. 07/23/23   Gaylyn Keas, [name], FNP  fluticasone  (FLONASE ) 50 MCG/ACT nasal spray USE 1 SPRAY IN EACH NOSTRIL ONCE DAILY. Patient taking differently: Place 1 spray into both nostrils daily as needed for allergies. 04/04/20   Delfina Feller, FNP  fluticasone  furoate-vilanterol (BREO ELLIPTA ) 100-25 MCG/ACT AEPB Inhale 1 puff by mouth once daily 04/22/23   Gaylyn Keas, [name], FNP  furosemide  (LASIX ) 40 MG tablet TAKE 1 TABLET DAILY AS NEEDED FOR EXCESSIVE FLUID. 06/16/23   Gaylyn Keas, [name], FNP  Glucagon , rDNA, (GLUCAGON  EMERGENCY) 1 MG KIT Inject 1 mg into the skin as needed. 06/26/22   Gaylyn Keas, [name], FNP  glucose blood (ACCU-CHEK GUIDE) test strip TEST BLOOD SUGAR UP TO 4 TIMES A DAY AS DIRECTED. R73.03 09/24/22   Gaylyn Keas, [name], FNP  hydrALAZINE  (APRESOLINE ) 25 MG tablet take 1 tablet by mouth 3 times daily as needed (for severe hypertension/systolic number 170 or greater). 06/22/23   Laurann Pollock, MD  HYDROcodone -acetaminophen  (NORCO) 7.5-325 MG tablet Take 1 tablet by mouth in the morning, at noon, and at bedtime. 08/14/23 09/13/23  Delfina Feller, FNP  HYDROcodone -acetaminophen  (NORCO) 7.5-325 MG tablet Take 1 tablet by mouth in the morning, at noon, and at bedtime. 07/16/23 08/15/23  Gaylyn Keas, [name], FNP  ipratropium (ATROVENT ) 0.02 % nebulizer solution USE 1 VIAL ( ) IN NEBULIZER EVERY 6 HOURS AS NEEDED FOR WHEEZING OR SHORTNESS OF BREATH. 06/28/20   Gaylyn Keas, [name], FNP  isosorbide  mononitrate (IMDUR ) 60 MG 24 hr tablet Take 1 tablet (60 mg total) by mouth 2 (two) times daily. 06/16/23   Gaylyn Keas [name], FNP  lamoTRIgine   (LAMICTAL ) 150 MG tablet Take 1 tablet (150 mg total) by mouth daily. 03/19/23   Gaylyn Keas, [name], FNP  levETIRAcetam  (KEPPRA ) 500 MG tablet Take 1 tablet (500 mg total) by mouth 2 (two) times daily. 06/16/23   Delfina Feller, FNP  levocetirizine (XYZAL ) 5 MG tablet take 1 tablet by mouth every morning. 06/22/23   Gaylyn Keas, [name], FNP  lidocaine  (LIDODERM ) 5 % place 1 patch onto the skin daily. remove & discard patch within 12 hours or as directed by md 06/22/23   Gaylyn Keas, [name], FNP  lidocaine  (XYLOCAINE ) 5 % ointment apply to affected area 3 times a day as needed for mild or moderate pain. 04/16/23   Gaylyn Keas, [name], FNP  methocarbamol  (ROBAXIN ) 500 MG tablet take 1 tablet (500 milligram total) by mouth 4 (four) times daily. 06/22/23   Gaylyn Keas, [name], FNP  naproxen  (NAPROSYN ) 500 MG tablet Take 1 tablet (500 mg total) by mouth 2 (two) times daily. 06/05/23   Barrett, Jamie N, PA-C  nitroGLYCERIN  (NITROSTAT ) 0.4 MG SL tablet place one (1) tablet under tongue every 5 minutes up to (3) doses as needed for chest pain. 06/22/23   Delfina Feller, FNP  ondansetron  (  ZOFRAN ) 4 MG tablet take 1 tablet by mouth every 8 hours as needed for nausea and vomiting. 07/24/23   Gaylyn Keas, [name], FNP  oxybutynin  (DITROPAN -XL) 5 MG 24 hr tablet Take 1 tablet (5 mg total) by mouth at bedtime. 11/26/21   Gaylyn Keas [name], FNP  Probiotic Product (PROBIOTIC PO) Take 1 capsule by mouth in the morning.    [provider]  promethazine -dextromethorphan (PROMETHAZINE -DM) 6.25-15 MG/5ML syrup Take 5 mLs by mouth 3 (three) times daily as needed for cough. 04/03/23   Tommas Fragmin A, FNP  rosuvastatin  (CRESTOR ) 10 MG tablet Take 1 tablet (10 mg total) by mouth daily. 06/16/23   Gaylyn Keas [name], FNP  traZODone  (DESYREL ) 150 MG tablet Take 1 tablet (150 mg total) by mouth at bedtime. 06/16/23   Gaylyn Keas, [name], FNP  valACYclovir  (VALTREX ) 1000 MG tablet Take 1 tablet  (1,000 mg total) by mouth 3 (three) times daily as needed (with active outbreaks.). 06/16/23   Gaylyn Keas, [name], FNP  valACYclovir  (VALTREX ) 500 MG tablet TAKE (1) TABLET TWICE DAILY. 03/19/23   Gaylyn Keas [name], FNP      Allergies    Codeine , Morphine and codeine , Ambien  [zolpidem  tartrate], Clonidine  derivatives, Metformin  and related, Lyrica  [pregabalin ], and Neurontin [gabapentin]    Review of Systems   Review of Systems  Physical Exam Updated Vital Signs BP 130/80 (BP Location: Right Arm)   Pulse 90   Temp 99.2 F (37.3 C) (Oral)   Resp 18   Ht 5\' 2"  (1.575 m)   Wt 54.4 kg   SpO2 95%   BMI 21.95 kg/m  Physical Exam Vitals and nursing note reviewed.  Constitutional:      General: She is not in acute distress.    Appearance: She is well-developed.  HENT:     Head: Normocephalic and atraumatic.     Right Ear: External ear normal.     Left Ear: External ear normal.     Nose: Nose normal.  Eyes:     Extraocular Movements: Extraocular movements intact.     Conjunctiva/sclera: Conjunctivae normal.     Pupils: Pupils are equal, round, and reactive to light.  Abdominal:     General: Abdomen is flat. There is no distension.     Palpations: Abdomen is soft. There is no mass.     Tenderness: There is abdominal tenderness (Suprapubic and left lower quadrant). There is no guarding.  Musculoskeletal:     Cervical back: Normal range of motion and neck supple.  Skin:    General: Skin is warm and dry.  Neurological:     Mental Status: She is alert and oriented to person, place, and time. Mental status is at baseline.  Psychiatric:        Mood and Affect: Mood normal.     ED Results / Procedures / Treatments   Labs (all labs ordered are listed, but only abnormal results are displayed) Labs Reviewed  COMPREHENSIVE METABOLIC PANEL WITH GFR - Abnormal; Notable for the following components:      Result Value   Glucose, Bld 115 (*)    All other components within normal  limits  CBC WITH DIFFERENTIAL/PLATELET - Abnormal; Notable for the following components:   HCT 46.1 (*)    Neutro Abs 7.8 (*)    Monocytes Absolute 1.1 (*)    All other components within normal limits  LIPASE, BLOOD    EKG EKG Interpretation Date/Time:  Friday July 24 2023 09:49:18 EDT Ventricular Rate:  83 PR Interval:  183 QRS Duration:  96 QT Interval:  379 QTC Calculation: 446 R Axis:   -58  Text Interpretation: Sinus rhythm Left anterior fascicular block Abnormal R-wave progression, 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 History of sigmoid diverticulitis. EXAM: CT ABDOMEN AND PELVIS WITH CONTRAST TECHNIQUE: Multidetector CT imaging of the abdomen and pelvis was performed using the standard protocol following bolus administration of intravenous contrast. RADIATION DOSE REDUCTION: This exam was performed according to the departmental dose-optimization program which includes automated exposure control, adjustment of the mA and/or kV according to patient size and/or use of iterative reconstruction technique. CONTRAST:  OMNIPAQUE  IOHEXOL  300 MG/ML  SOLN COMPARISON:  CT abdomen pelvis dated 08/16/2022. FINDINGS: Lower chest: Bibasilar linear atelectasis/scarring. No intra-abdominal free air or free fluid. Hepatobiliary: The liver is unremarkable. There is mild biliary dilatation, post cholecystectomy. No retained calcified stone noted in the central CBD. Pancreas: Unremarkable. No pancreatic ductal dilatation or surrounding inflammatory changes. Spleen: Normal in size without focal abnormality. Adrenals/Urinary Tract: The adrenal glands are unremarkable. The kidneys, visualized ureters, and urinary bladder appear unremarkable. Stomach/Bowel: There is moderate stool throughout the colon. There is sigmoid  diverticulosis with muscular hypertrophy. Diffuse perisigmoid stranding and edema may represent diverticulitis or mild stercoral colitis. No diverticular abscess or perforation. There is no bowel obstruction. There is a small hiatal hernia. The appendix is normal. Vascular/Lymphatic: Mild atherosclerotic calcification of the abdominal aorta. The IVC is unremarkable. No portal venous gas. There is no adenopathy. Reproductive: The uterus is poorly visualized. No suspicious adnexal masses. Other: None Musculoskeletal: Osteopenia with degenerative changes of the spine. Lower lumbar posterior fusion. Total left hip arthroplasty. No acute osseous pathology. Chronic T12 compression fracture IMPRESSION: 1. Sigmoid diverticulosis with muscular hypertrophy. Diffuse perisigmoid stranding and edema may represent diverticulitis or mild stercoral colitis. No diverticular abscess or perforation. 2. No bowel obstruction. Normal appendix. 3.  Aortic Atherosclerosis (ICD10-I70.0). Electronically Signed   By: Angus Bark M.D.   On: 07/24/2023 11:36    Procedures Procedures    Medications Ordered in ED Medications  ondansetron  (ZOFRAN ) injection 4 mg (4 mg Intravenous Given 07/24/23 1101)  HYDROmorphone  (DILAUDID ) injection 0.5 mg (0.5 mg Intravenous Given 07/24/23 1102)  iohexol  (OMNIPAQUE ) 300 MG/ML solution 100 mL (100 mLs Intravenous Contrast Given 07/24/23 1112)  ketorolac  (TORADOL ) 15 MG/ML injection 15 mg (15 mg Intravenous Given 07/24/23 1231)    ED Course/ Medical Decision Making/ A&P                                 Medical Decision Making Amount and/or Complexity of Data Reviewed Labs: ordered. Radiology: ordered.  Risk Prescription drug management.   SIYANNA POZNANSKI is a 68 y.o. female with comorbidities that complicate the patient evaluation including diverticulitis who presents to the emergency department for lower abdominal pain and nausea and vomiting.   Initial Ddx:  Diverticulitis,  colitis, UTI, testicular/ovarian torsion, kidney stone, pyelonephritis  MDM:  Based on the patient's symptoms feel that they likely have diverticulitis.  Will obtain CT scan which will show if they have colitis which could also be causing similar symptoms.  This will also show they have a kidney stone.  Not having any urinary symptoms suggest UTI or pyelonephritis.  Also considered testicular torsion but given the lack of testicular pain on exam feel this  is less likely.  Also considered ovarian torsion and if CT does show enlarged ovary we will talk to OB/GYN and possibly obtain transvaginal ultrasound.  Plan:  Labs CT abdomen pelvis IV contrast Pain and nausea medication  ED Summary/Re-evaluation:  Patient was found to have diverticulitis.  Will treat them with antibiotics at this time and to start a clear liquid diet and to advance as tolerated.  Instructed to follow-up with her primary doctor in several days and GI in 6 weeks to see if colonoscopy or any other interventions are required.  Also instructed to follow-up with surgery since she has had multiple episodes of this and may benefit from a conversation about colectomy.  This patient presents to the ED for concern of complaints listed in HPI, this involves an extensive number of treatment options, and is a complaint that carries with it a high risk of complications and morbidity. Disposition including potential need for admission considered.   Dispo: DC Home. Return precautions discussed including, but not limited to, those listed in the AVS. Allowed pt time to ask questions which were answered fully prior to dc.  Records reviewed Outpatient Clinic Notes The following labs were independently interpreted: Chemistry and show  no acute findings I independently reviewed the following imaging with scope of interpretation limited to determining acute life threatening conditions related to emergency care: CT Abdomen/Pelvis and agree with the  radiologist interpretation with the following exceptions: none I personally reviewed and interpreted cardiac monitoring: normal sinus rhythm  I personally reviewed and interpreted the pt's EKG: see above for interpretation  I have reviewed the patients home medications and made adjustments as needed Social Determinants of health:  Geriatric   Final Clinical Impression(s) / ED Diagnoses Final diagnoses:  Diverticulitis  Constipation, unspecified constipation type    Rx / DC Orders ED Discharge Orders          Ordered    amoxicillin -clavulanate (AUGMENTIN ) 875-125 MG tablet  Every 12 hours        07/24/23 1221    metoCLOPramide  (REGLAN ) 10 MG tablet  Every 6 hours        07/24/23 1221              Ninetta Basket, MD 07/25/23 1019

## 2023-07-24 NOTE — Discharge Instructions (Addendum)
 You were seen for diverticulitis in the emergency department.   At home, please take the antibiotics we have prescribed you. Take the reglan  for your nausea and vomiting. Start with a clear liquid diet (coffee, tea, sports drinks, broths, etc.) and advance as tolerated to solid foods over the next few days.    Check your MyChart online for the results of any tests that had not resulted by the time you left the emergency department.   Follow-up with your primary doctor in 2-3 days regarding your visit.  Follow-up with the gastroenterologist to discuss if you need a colonoscopy in 6 weeks. If you have had more than 3 occurrences you may need to also talk to the general surgeons to see if you need an operation.   Return immediately to the emergency department if you experience any of the following: severe abdominal pain, high fevers, or any other concerning symptoms.    Thank you for visiting our Emergency Department. It was a pleasure taking care of you today.

## 2023-07-26 ENCOUNTER — Emergency Department (HOSPITAL_COMMUNITY)

## 2023-07-26 ENCOUNTER — Encounter (HOSPITAL_COMMUNITY): Payer: Self-pay | Admitting: *Deleted

## 2023-07-26 ENCOUNTER — Inpatient Hospital Stay (HOSPITAL_COMMUNITY)
Admission: EM | Admit: 2023-07-26 | Discharge: 2023-07-28 | DRG: 392 | Disposition: A | Discharge status: Home / Self Care | Attending: Family Medicine | Admitting: Family Medicine

## 2023-07-26 ENCOUNTER — Other Ambulatory Visit: Payer: Self-pay

## 2023-07-26 DIAGNOSIS — Z7982 Long term (current) use of aspirin: Secondary | ICD-10-CM

## 2023-07-26 DIAGNOSIS — G4700 Insomnia, unspecified: Secondary | ICD-10-CM | POA: Diagnosis present

## 2023-07-26 DIAGNOSIS — E119 Type 2 diabetes mellitus without complications: Secondary | ICD-10-CM | POA: Diagnosis present

## 2023-07-26 DIAGNOSIS — Z79899 Other long term (current) drug therapy: Secondary | ICD-10-CM

## 2023-07-26 DIAGNOSIS — Z96642 Presence of left artificial hip joint: Secondary | ICD-10-CM | POA: Diagnosis present

## 2023-07-26 DIAGNOSIS — R109 Unspecified abdominal pain: Secondary | ICD-10-CM | POA: Diagnosis not present

## 2023-07-26 DIAGNOSIS — G40909 Epilepsy, unspecified, not intractable, without status epilepticus: Secondary | ICD-10-CM | POA: Diagnosis present

## 2023-07-26 DIAGNOSIS — K5732 Diverticulitis of large intestine without perforation or abscess without bleeding: Secondary | ICD-10-CM | POA: Diagnosis not present

## 2023-07-26 DIAGNOSIS — K219 Gastro-esophageal reflux disease without esophagitis: Secondary | ICD-10-CM | POA: Diagnosis present

## 2023-07-26 DIAGNOSIS — I252 Old myocardial infarction: Secondary | ICD-10-CM

## 2023-07-26 DIAGNOSIS — Z96611 Presence of right artificial shoulder joint: Secondary | ICD-10-CM | POA: Diagnosis present

## 2023-07-26 DIAGNOSIS — M4800 Spinal stenosis, site unspecified: Secondary | ICD-10-CM | POA: Diagnosis present

## 2023-07-26 DIAGNOSIS — J4489 Other specified chronic obstructive pulmonary disease: Secondary | ICD-10-CM | POA: Diagnosis present

## 2023-07-26 DIAGNOSIS — Z8249 Family history of ischemic heart disease and other diseases of the circulatory system: Secondary | ICD-10-CM

## 2023-07-26 DIAGNOSIS — Z7984 Long term (current) use of oral hypoglycemic drugs: Secondary | ICD-10-CM

## 2023-07-26 DIAGNOSIS — F419 Anxiety disorder, unspecified: Secondary | ICD-10-CM | POA: Diagnosis present

## 2023-07-26 DIAGNOSIS — E785 Hyperlipidemia, unspecified: Secondary | ICD-10-CM | POA: Diagnosis present

## 2023-07-26 DIAGNOSIS — M1990 Unspecified osteoarthritis, unspecified site: Secondary | ICD-10-CM | POA: Diagnosis present

## 2023-07-26 DIAGNOSIS — B009 Herpesviral infection, unspecified: Secondary | ICD-10-CM | POA: Diagnosis present

## 2023-07-26 DIAGNOSIS — F319 Bipolar disorder, unspecified: Secondary | ICD-10-CM | POA: Diagnosis present

## 2023-07-26 DIAGNOSIS — K5792 Diverticulitis of intestine, part unspecified, without perforation or abscess without bleeding: Principal | ICD-10-CM | POA: Diagnosis present

## 2023-07-26 DIAGNOSIS — I5032 Chronic diastolic (congestive) heart failure: Secondary | ICD-10-CM | POA: Diagnosis present

## 2023-07-26 DIAGNOSIS — I110 Hypertensive heart disease with heart failure: Secondary | ICD-10-CM | POA: Diagnosis present

## 2023-07-26 DIAGNOSIS — K449 Diaphragmatic hernia without obstruction or gangrene: Secondary | ICD-10-CM | POA: Diagnosis not present

## 2023-07-26 DIAGNOSIS — R1032 Left lower quadrant pain: Secondary | ICD-10-CM | POA: Diagnosis not present

## 2023-07-26 DIAGNOSIS — K5900 Constipation, unspecified: Secondary | ICD-10-CM | POA: Diagnosis present

## 2023-07-26 DIAGNOSIS — M797 Fibromyalgia: Secondary | ICD-10-CM | POA: Diagnosis present

## 2023-07-26 DIAGNOSIS — I2510 Atherosclerotic heart disease of native coronary artery without angina pectoris: Secondary | ICD-10-CM | POA: Diagnosis present

## 2023-07-26 LAB — URINALYSIS, ROUTINE W REFLEX MICROSCOPIC
Bacteria, UA: NONE SEEN
Bilirubin Urine: NEGATIVE
Glucose, UA: >=500 mg/dL — AB
Hgb urine dipstick: NEGATIVE
Ketones, ur: 5 mg/dL — AB
Leukocytes,Ua: NEGATIVE
Nitrite: NEGATIVE
Protein, ur: NEGATIVE mg/dL
Specific Gravity, Urine: 1.01 (ref 1.005–1.030)
pH: 5 (ref 5.0–8.0)

## 2023-07-26 LAB — CBC WITH DIFFERENTIAL/PLATELET
Abs Immature Granulocytes: 0.04 10*3/uL (ref 0.00–0.07)
Basophils Absolute: 0 10*3/uL (ref 0.0–0.1)
Basophils Relative: 0 %
Eosinophils Absolute: 0 10*3/uL (ref 0.0–0.5)
Eosinophils Relative: 0 %
HCT: 44.1 % (ref 36.0–46.0)
Hemoglobin: 14.1 g/dL (ref 12.0–15.0)
Immature Granulocytes: 0 %
Lymphocytes Relative: 10 %
Lymphs Abs: 1.3 10*3/uL (ref 0.7–4.0)
MCH: 31.3 pg (ref 26.0–34.0)
MCHC: 32 g/dL (ref 30.0–36.0)
MCV: 97.8 fL (ref 80.0–100.0)
Monocytes Absolute: 1.4 10*3/uL — ABNORMAL HIGH (ref 0.1–1.0)
Monocytes Relative: 11 %
Neutro Abs: 10 10*3/uL — ABNORMAL HIGH (ref 1.7–7.7)
Neutrophils Relative %: 79 %
Platelets: 169 10*3/uL (ref 150–400)
RBC: 4.51 MIL/uL (ref 3.87–5.11)
RDW: 12.5 % (ref 11.5–15.5)
WBC: 12.8 10*3/uL — ABNORMAL HIGH (ref 4.0–10.5)
nRBC: 0 % (ref 0.0–0.2)

## 2023-07-26 LAB — COMPREHENSIVE METABOLIC PANEL WITH GFR
ALT: 11 U/L (ref 0–44)
AST: 15 U/L (ref 15–41)
Albumin: 3.5 g/dL (ref 3.5–5.0)
Alkaline Phosphatase: 95 U/L (ref 38–126)
Anion gap: 14 (ref 5–15)
BUN: 18 mg/dL (ref 8–23)
CO2: 24 mmol/L (ref 22–32)
Calcium: 9.4 mg/dL (ref 8.9–10.3)
Chloride: 97 mmol/L — ABNORMAL LOW (ref 98–111)
Creatinine, Ser: 0.84 mg/dL (ref 0.44–1.00)
GFR, Estimated: >60 mL/min (ref >60)
Glucose, Bld: 97 mg/dL (ref 70–99)
Potassium: 3.6 mmol/L (ref 3.5–5.1)
Sodium: 135 mmol/L (ref 135–145)
Total Bilirubin: 1 mg/dL (ref 0.0–1.2)
Total Protein: 7.5 g/dL (ref 6.5–8.1)

## 2023-07-26 LAB — LIPASE, BLOOD: Lipase: 21 U/L (ref 11–51)

## 2023-07-26 LAB — LACTIC ACID, PLASMA
Lactic Acid, Venous: 1.3 mmol/L (ref 0.5–1.9)
Lactic Acid, Venous: 1 mmol/L (ref 0.5–1.9)

## 2023-07-26 LAB — GLUCOSE, CAPILLARY: Glucose-Capillary: 93 mg/dL (ref 70–99)

## 2023-07-26 MED ORDER — ONDANSETRON HCL 4 MG PO TABS: 4.0000 mg | ORAL_TABLET | Freq: Four times a day (QID) | ORAL | Status: DC | PRN

## 2023-07-26 MED ORDER — ONDANSETRON HCL 4 MG/2ML IJ SOLN
4.0000 mg | Freq: Once | INTRAMUSCULAR | Status: AC
  Administered 2023-07-26: 4 mg via INTRAVENOUS
  Filled 2023-07-26: qty 2

## 2023-07-26 MED ORDER — LEVETIRACETAM 500 MG PO TABS
500.0000 mg | ORAL_TABLET | Freq: Two times a day (BID) | ORAL | Status: DC
  Administered 2023-07-26 – 2023-07-28 (×4): 500 mg via ORAL
  Filled 2023-07-26 (×4): qty 1

## 2023-07-26 MED ORDER — METRONIDAZOLE 500 MG/100ML IV SOLN
500.0000 mg | Freq: Two times a day (BID) | INTRAVENOUS | Status: DC
  Administered 2023-07-26 – 2023-07-28 (×4): 500 mg via INTRAVENOUS
  Filled 2023-07-26 (×4): qty 100

## 2023-07-26 MED ORDER — SODIUM CHLORIDE 0.9 % IV SOLN
2.0000 g | INTRAVENOUS | Status: DC
  Administered 2023-07-26 – 2023-07-27 (×2): 2 g via INTRAVENOUS
  Filled 2023-07-26 (×2): qty 20

## 2023-07-26 MED ORDER — ROSUVASTATIN CALCIUM 10 MG PO TABS
10.0000 mg | ORAL_TABLET | Freq: Every day | ORAL | Status: DC
  Administered 2023-07-27 – 2023-07-28 (×2): 10 mg via ORAL
  Filled 2023-07-26 (×3): qty 1

## 2023-07-26 MED ORDER — SENNOSIDES-DOCUSATE SODIUM 8.6-50 MG PO TABS
1.0000 | ORAL_TABLET | Freq: Every evening | ORAL | Status: DC | PRN
  Administered 2023-07-27: 1 via ORAL
  Filled 2023-07-26: qty 1

## 2023-07-26 MED ORDER — INSULIN ASPART 100 UNIT/ML IJ SOLN
0.0000 [IU] | INTRAMUSCULAR | Status: DC
  Administered 2023-07-27: 1 [IU] via SUBCUTANEOUS

## 2023-07-26 MED ORDER — CELECOXIB 100 MG PO CAPS
200.0000 mg | ORAL_CAPSULE | Freq: Every day | ORAL | Status: DC
  Administered 2023-07-27 – 2023-07-28 (×2): 200 mg via ORAL
  Filled 2023-07-26 (×3): qty 2

## 2023-07-26 MED ORDER — VALACYCLOVIR HCL 500 MG PO TABS
500.0000 mg | ORAL_TABLET | Freq: Two times a day (BID) | ORAL | Status: DC
  Administered 2023-07-26 – 2023-07-28 (×4): 500 mg via ORAL
  Filled 2023-07-26 (×4): qty 1

## 2023-07-26 MED ORDER — FLUTICASONE PROPIONATE 50 MCG/ACT NA SUSP: 1.0000 | Freq: Every day | NASAL | Status: DC | PRN

## 2023-07-26 MED ORDER — PIPERACILLIN-TAZOBACTAM 3.375 G IVPB 30 MIN
3.3750 g | Freq: Once | INTRAVENOUS | Status: AC
  Administered 2023-07-26: 3.375 g via INTRAVENOUS
  Filled 2023-07-26: qty 50

## 2023-07-26 MED ORDER — ACETAMINOPHEN 325 MG PO TABS
650.0000 mg | ORAL_TABLET | Freq: Four times a day (QID) | ORAL | Status: DC | PRN
  Administered 2023-07-26: 650 mg via ORAL
  Filled 2023-07-26: qty 2

## 2023-07-26 MED ORDER — ESCITALOPRAM OXALATE 10 MG PO TABS
20.0000 mg | ORAL_TABLET | Freq: Every day | ORAL | Status: DC
  Administered 2023-07-27 – 2023-07-28 (×2): 20 mg via ORAL
  Filled 2023-07-26 (×2): qty 2

## 2023-07-26 MED ORDER — BUSPIRONE HCL 5 MG PO TABS
10.0000 mg | ORAL_TABLET | Freq: Two times a day (BID) | ORAL | Status: DC
  Administered 2023-07-26 – 2023-07-28 (×4): 10 mg via ORAL
  Filled 2023-07-26 (×5): qty 2

## 2023-07-26 MED ORDER — HYDROMORPHONE HCL 1 MG/ML IJ SOLN
0.5000 mg | Freq: Once | INTRAMUSCULAR | Status: AC
  Administered 2023-07-26: 0.5 mg via INTRAVENOUS
  Filled 2023-07-26: qty 0.5

## 2023-07-26 MED ORDER — FLUTICASONE FUROATE-VILANTEROL 100-25 MCG/ACT IN AEPB
1.0000 | INHALATION_SPRAY | Freq: Every day | RESPIRATORY_TRACT | Status: DC
  Administered 2023-07-27 – 2023-07-28 (×2): 1 via RESPIRATORY_TRACT
  Filled 2023-07-26: qty 28

## 2023-07-26 MED ORDER — FLUDROCORTISONE ACETATE 0.1 MG PO TABS
0.1000 mg | ORAL_TABLET | ORAL | Status: DC
  Administered 2023-07-27: 0.1 mg via ORAL
  Filled 2023-07-26: qty 1

## 2023-07-26 MED ORDER — HYDROMORPHONE HCL 1 MG/ML IJ SOLN
0.5000 mg | Freq: Four times a day (QID) | INTRAMUSCULAR | Status: DC | PRN
  Administered 2023-07-26 – 2023-07-27 (×2): 0.5 mg via INTRAVENOUS
  Filled 2023-07-26 (×2): qty 0.5

## 2023-07-26 MED ORDER — DULOXETINE HCL 30 MG PO CPEP
60.0000 mg | ORAL_CAPSULE | Freq: Every day | ORAL | Status: DC
  Administered 2023-07-27: 60 mg via ORAL
  Filled 2023-07-26: qty 2

## 2023-07-26 MED ORDER — SENNOSIDES-DOCUSATE SODIUM 8.6-50 MG PO TABS
1.0000 | ORAL_TABLET | Freq: Once | ORAL | Status: AC
  Administered 2023-07-26: 1 via ORAL
  Filled 2023-07-26: qty 1

## 2023-07-26 MED ORDER — ENOXAPARIN SODIUM 40 MG/0.4ML IJ SOSY
40.0000 mg | PREFILLED_SYRINGE | INTRAMUSCULAR | Status: DC
  Administered 2023-07-26 – 2023-07-27 (×2): 40 mg via SUBCUTANEOUS
  Filled 2023-07-26 (×2): qty 0.4

## 2023-07-26 MED ORDER — HYDROCODONE-ACETAMINOPHEN 7.5-325 MG PO TABS
1.0000 | ORAL_TABLET | Freq: Three times a day (TID) | ORAL | Status: DC
  Administered 2023-07-26 – 2023-07-28 (×5): 1 via ORAL
  Filled 2023-07-26 (×5): qty 1

## 2023-07-26 MED ORDER — SODIUM CHLORIDE 0.9 % IV BOLUS
1000.0000 mL | Freq: Once | INTRAVENOUS | Status: AC
  Administered 2023-07-26: 1000 mL via INTRAVENOUS

## 2023-07-26 MED ORDER — ACETAMINOPHEN 650 MG RE SUPP: 650.0000 mg | Freq: Four times a day (QID) | RECTAL | Status: DC | PRN

## 2023-07-26 MED ORDER — ONDANSETRON HCL 4 MG/2ML IJ SOLN: 4.0000 mg | Freq: Four times a day (QID) | INTRAMUSCULAR | Status: DC | PRN

## 2023-07-26 MED ORDER — TRAZODONE HCL 50 MG PO TABS
150.0000 mg | ORAL_TABLET | Freq: Every day | ORAL | Status: DC
  Administered 2023-07-26 – 2023-07-27 (×2): 150 mg via ORAL
  Filled 2023-07-26 (×2): qty 3

## 2023-07-26 MED ORDER — PANTOPRAZOLE SODIUM 40 MG PO TBEC
40.0000 mg | DELAYED_RELEASE_TABLET | Freq: Every day | ORAL | Status: DC
  Administered 2023-07-27 – 2023-07-28 (×2): 40 mg via ORAL
  Filled 2023-07-26 (×3): qty 1

## 2023-07-26 MED ORDER — DICLOFENAC SODIUM 1 % EX GEL: 4.0000 g | Freq: Four times a day (QID) | CUTANEOUS | Status: DC | PRN

## 2023-07-26 MED ORDER — SODIUM CHLORIDE 0.9 % IV SOLN
INTRAVENOUS | Status: DC
Start: 2023-07-26 — End: 2023-07-27

## 2023-07-26 MED ORDER — IOHEXOL 300 MG/ML  SOLN
100.0000 mL | Freq: Once | INTRAMUSCULAR | Status: AC | PRN
  Administered 2023-07-26: 100 mL via INTRAVENOUS

## 2023-07-26 MED ORDER — LAMOTRIGINE 25 MG PO TABS
150.0000 mg | ORAL_TABLET | Freq: Every day | ORAL | Status: DC
  Administered 2023-07-27 – 2023-07-28 (×2): 150 mg via ORAL
  Filled 2023-07-26 (×2): qty 6

## 2023-07-26 MED ORDER — METHOCARBAMOL 500 MG PO TABS
500.0000 mg | ORAL_TABLET | Freq: Three times a day (TID) | ORAL | Status: DC
  Administered 2023-07-26 – 2023-07-28 (×5): 500 mg via ORAL
  Filled 2023-07-26 (×5): qty 1

## 2023-07-26 NOTE — ED Provider Notes (Signed)
 Day EMERGENCY DEPARTMENT AT [facility] Provider Note   CSN: [id number] Arrival date & time: 07/26/23  1136     History  Chief Complaint  Patient presents with   Abdominal Pain    [name] is a 68 y.o. female.  Patient is a 68 year old female who presents to the emergency department with a chief complaint of worsening left lower quadrant abdominal pain, nausea and vomiting.  She was evaluated in the emergency department 2 days ago and diagnosed with diverticulitis.  Patient does note that she has had a difficult time tolerating her antibiotics given her ongoing vomiting and has had limited p.o. intake.  She notes that she has not moved her bowels much over the past 2 days.  She has had no further fever or chills.  She denies any chest pain or shortness of breath.  She has continued to urinate at her baseline.   Abdominal Pain      Home Medications Prior to Admission medications   Medication Sig Start Date End Date Taking? Authorizing Provider  Accu-Chek Softclix Lancets lancets test blood sugar up to 4 times a day as directed. Dx R73.03 03/02/23   Delfina Feller, FNP  acyclovir  ointment (ZOVIRAX ) 5 % apply 1 application topically every 3 (three) hours as needed (outbreaks). 07/23/23   Gaylyn Keas [name], FNP  albuterol  (VENTOLIN  HFA) 108 (90 Base) MCG/ACT inhaler inhale 2 puffs every 6 hours as needed for shortness of breath and wheezing. 04/16/23   Delfina Feller, FNP  Alcohol  Swabs  (B-D SINGLE USE SWABS  REGULAR) PADS USE 1 PAD DAILY WHEN CHECKING BLOOD SUGAR. R73.03 06/26/22   Gaylyn Keas, [name], FNP  amoxicillin -clavulanate (AUGMENTIN ) 875-125 MG tablet Take 1 tablet by mouth every 12 (twelve) hours. 07/24/23   Ninetta Basket, MD  aspirin  EC 325 MG tablet Take 1 tablet (325 mg total) by mouth daily. 09/18/22   Wilhelmenia Harada, MD  benzonatate  (TESSALON ) 200 MG capsule Take 1 capsule (200 mg total) by mouth 3 (three) times daily as  needed. 03/30/23   Yevette Hem, FNP  blood glucose meter kit and supplies Dispense based on patient and insurance preference. Use up to four times daily as directed. (FOR ICD-10 E10.9, E11.9). 02/04/21   Gaylyn Keas [name], FNP  busPIRone  (BUSPAR ) 10 MG tablet Take 1 tablet (10 mg total) by mouth 2 (two) times daily as needed. 06/16/23   Delfina Feller, FNP  butalbital -acetaminophen -caffeine  (FIORICET ) 50-325-40 MG tablet take 1 tablet by mouth every 6 (six) hours as needed for headache. 07/23/23   Gaylyn Keas, [name], FNP  carvedilol  (COREG ) 3.125 MG tablet TAKE 1/2 (0.5) TABLET TWICE A DAY WITH A MEAL; BLOOD PRESSURE OVER 150 BEFORE TAKING. 06/16/23   Gaylyn Keas [name], FNP  celecoxib  (CELEBREX ) 200 MG capsule Take 1 capsule (200 mg total) by mouth daily as needed. 06/16/23   Gaylyn Keas [name], FNP  dexlansoprazole  (DEXILANT ) 60 MG capsule Take 1 capsule (60 mg total) by mouth daily. 06/16/23   Gaylyn Keas, [name], FNP  Dextromethorphan-GG-APAP (CORICIDIN HBP COLD/COUGH/FLU) 10-200-325 MG/15ML LIQD Take 30 mLs by mouth every 4 (four) hours as needed (cough). 02/03/23   Milian, Winda Hastings, FNP  diclofenac  Sodium (VOLTAREN ) 1 % GEL APPLY 4 GRAMS TOPICALLY 4 TIMES A DAY. Patient taking differently: Apply 4 g topically 4 (four) times daily as needed (pain). 03/01/21   Gaylyn Keas [name], FNP  DULoxetine  (CYMBALTA ) 60 MG capsule Take 1 capsule (60 mg total) by mouth at bedtime. 06/16/23   Gaylyn Keas [name], FNP  empagliflozin  (JARDIANCE ) 25 MG TABS tablet  Take 1 tablet (25 mg total) by mouth daily. 06/16/23   Gaylyn Keas [name], FNP  escitalopram  (LEXAPRO ) 20 MG tablet Take 1 tablet (20 mg total) by mouth daily. 06/16/23   Gaylyn Keas, [name], FNP  estradiol  (ESTRACE ) 0.1 MG/GM vaginal cream Place 1 Applicatorful vaginally 3 (three) times a week. 03/20/23   Gaylyn Keas, [name], FNP  FIBER PO Take 1 tablet by mouth in the morning and at bedtime.    [provider]  fluconazole  (DIFLUCAN ) 150 MG tablet 1 po q week x 4 weeks 07/21/23   Delfina Feller, FNP  fludrocortisone  (FLORINEF ) 0.1 MG tablet take 1 tablet (100mcg total) by mouth every other day. 07/23/23   Gaylyn Keas, [name], FNP  fluticasone  (FLONASE ) 50 MCG/ACT nasal spray USE 1 SPRAY IN EACH NOSTRIL ONCE DAILY. Patient taking differently: Place 1 spray into both nostrils daily as needed for allergies. 04/04/20   Delfina Feller, FNP  fluticasone  furoate-vilanterol (BREO ELLIPTA ) 100-25 MCG/ACT AEPB Inhale 1 puff by mouth once daily 04/22/23   Gaylyn Keas, [name], FNP  furosemide  (LASIX ) 40 MG tablet TAKE 1 TABLET DAILY AS NEEDED FOR EXCESSIVE FLUID. 06/16/23   Gaylyn Keas, [name], FNP  Glucagon , rDNA, (GLUCAGON  EMERGENCY) 1 MG KIT Inject 1 mg into the skin as needed. 06/26/22   Gaylyn Keas, [name], FNP  glucose blood (ACCU-CHEK GUIDE) test strip TEST BLOOD SUGAR UP TO 4 TIMES A DAY AS DIRECTED. R73.03 09/24/22   Gaylyn Keas, [name], FNP  hydrALAZINE  (APRESOLINE ) 25 MG tablet take 1 tablet by mouth 3 times daily as needed (for severe hypertension/systolic number 170 or greater). 06/22/23   Laurann Pollock, MD  HYDROcodone -acetaminophen  (NORCO) 7.5-325 MG tablet Take 1 tablet by mouth in the morning, at noon, and at bedtime. 08/14/23 09/13/23  Delfina Feller, FNP  HYDROcodone -acetaminophen  (NORCO) 7.5-325 MG tablet Take 1 tablet by mouth in the morning, at noon, and at bedtime. 07/16/23 08/15/23  Gaylyn Keas, [name], FNP  ipratropium (ATROVENT ) 0.02 % nebulizer solution USE 1 VIAL ( ) IN NEBULIZER EVERY 6 HOURS AS NEEDED FOR WHEEZING OR SHORTNESS OF BREATH. 06/28/20   Gaylyn Keas, [name], FNP  isosorbide  mononitrate (IMDUR ) 60 MG 24 hr tablet Take 1 tablet (60 mg total) by mouth 2 (two) times daily. 06/16/23   Gaylyn Keas [name], FNP  lamoTRIgine  (LAMICTAL ) 150 MG tablet Take 1 tablet (150 mg total) by mouth daily. 03/19/23   Gaylyn Keas [name], FNP  levETIRAcetam  (KEPPRA )  500 MG tablet Take 1 tablet (500 mg total) by mouth 2 (two) times daily. 06/16/23   Delfina Feller, FNP  levocetirizine (XYZAL ) 5 MG tablet take 1 tablet by mouth every morning. 06/22/23   Gaylyn Keas, [name], FNP  lidocaine  (LIDODERM ) 5 % place 1 patch onto the skin daily. remove & discard patch within 12 hours or as directed by md 06/22/23   Gaylyn Keas, [name], FNP  lidocaine  (XYLOCAINE ) 5 % ointment apply to affected area 3 times a day as needed for mild or moderate pain. 04/16/23   Gaylyn Keas [name], FNP  methocarbamol  (ROBAXIN ) 500 MG tablet take 1 tablet (500 milligram total) by mouth 4 (four) times daily. 06/22/23   Gaylyn Keas [name], FNP  metoCLOPramide  (REGLAN ) 10 MG tablet Take 1 tablet (10 mg total) by mouth every 6 (six) hours. 07/24/23   Ninetta Basket, MD  naproxen  (NAPROSYN ) 500 MG tablet Take 1 tablet (500 mg total) by mouth 2 (two) times daily. 06/05/23   Barrett, Jamie N, PA-C  nitroGLYCERIN  (NITROSTAT ) 0.4 MG SL tablet place one (1) tablet under tongue every 5 minutes up to (3) doses as needed for  chest pain. 06/22/23   Delfina Feller, FNP  ondansetron  (ZOFRAN ) 4 MG tablet take 1 tablet by mouth every 8 hours as needed for nausea and vomiting. 07/24/23   Gaylyn Keas, [name], FNP  oxybutynin  (DITROPAN -XL) 5 MG 24 hr tablet Take 1 tablet (5 mg total) by mouth at bedtime. 11/26/21   Gaylyn Keas [name], FNP  Probiotic Product (PROBIOTIC PO) Take 1 capsule by mouth in the morning.    [provider]  promethazine -dextromethorphan (PROMETHAZINE -DM) 6.25-15 MG/5ML syrup Take 5 mLs by mouth 3 (three) times daily as needed for cough. 04/03/23   Tommas Fragmin A, FNP  rosuvastatin  (CRESTOR ) 10 MG tablet Take 1 tablet (10 mg total) by mouth daily. 06/16/23   Gaylyn Keas [name], FNP  traZODone  (DESYREL ) 150 MG tablet Take 1 tablet (150 mg total) by mouth at bedtime. 06/16/23   Gaylyn Keas, [name], FNP  valACYclovir  (VALTREX ) 1000 MG tablet Take 1 tablet (1,000  mg total) by mouth 3 (three) times daily as needed (with active outbreaks.). 06/16/23   Gaylyn Keas, [name], FNP  valACYclovir  (VALTREX ) 500 MG tablet TAKE (1) TABLET TWICE DAILY. 03/19/23   Gaylyn Keas [name], FNP      Allergies    Codeine , Morphine and codeine , Ambien  [zolpidem  tartrate], Clonidine  derivatives, Metformin  and related, Lyrica  [pregabalin ], and Neurontin [gabapentin]    Review of Systems   Review of Systems  Gastrointestinal:  Positive for abdominal pain.  All other systems reviewed and are negative.   Physical Exam Updated Vital Signs BP 114/78 (BP Location: Left Arm)   Pulse (!) 110   Temp 98.6 F (37 C) (Oral)   Resp 20   Ht 5\' 2"  (1.575 m)   Wt 54.4 kg   SpO2 92%   BMI 21.95 kg/m  Physical Exam Vitals and nursing note reviewed.  Constitutional:      Appearance: Normal appearance.  HENT:     Head: Normocephalic and atraumatic.     Nose: Nose normal.     Mouth/Throat:     Mouth: Mucous membranes are moist.  Eyes:     Extraocular Movements: Extraocular movements intact.     Conjunctiva/sclera: Conjunctivae normal.     Pupils: Pupils are equal, round, and reactive to light.  Cardiovascular:     Rate and Rhythm: Normal rate and regular rhythm.     Pulses: Normal pulses.     Heart sounds: Normal heart sounds.  Pulmonary:     Effort: Pulmonary effort is normal.     Breath sounds: Normal breath sounds.  Abdominal:     General: Abdomen is flat. Bowel sounds are normal. There is no distension.     Palpations: Abdomen is soft. There is no mass.     Tenderness: There is abdominal tenderness in the left lower quadrant. There is no guarding. Negative signs include Murphy's sign and McBurney's sign.     Hernia: No hernia is present.  Musculoskeletal:        General: Normal range of motion.     Cervical back: Normal range of motion and neck supple.  Skin:    General: Skin is warm and dry.     Coloration: Skin is not jaundiced.     Findings: No erythema  or rash.  Neurological:     General: No focal deficit present.     Mental Status: She is alert and oriented to person, place, and time. Mental status is at baseline.  Psychiatric:        Mood and Affect: Mood normal.  Behavior: Behavior normal.        Thought Content: Thought content normal.        Judgment: Judgment normal.     ED Results / Procedures / Treatments   Labs (all labs ordered are listed, but only abnormal results are displayed) Labs Reviewed  COMPREHENSIVE METABOLIC PANEL WITH GFR - Abnormal; Notable for the following components:      Result Value   Chloride 97 (*)    All other components within normal limits  CBC WITH DIFFERENTIAL/PLATELET - Abnormal; Notable for the following components:   WBC 12.8 (*)    Neutro Abs 10.0 (*)    Monocytes Absolute 1.4 (*)    All other components within normal limits  CULTURE, BLOOD (ROUTINE X 2)  CULTURE, BLOOD (ROUTINE X 2)  LIPASE, BLOOD  LACTIC ACID, PLASMA  LACTIC ACID, PLASMA  URINALYSIS, ROUTINE W REFLEX MICROSCOPIC    EKG None  Radiology No results found.  Procedures Procedures    Medications Ordered in ED Medications  piperacillin-tazobactam (ZOSYN) IVPB 3.375 g (0 g Intravenous Stopped 07/26/23 1508)  ondansetron  (ZOFRAN ) injection 4 mg (4 mg Intravenous Given 07/26/23 1302)  HYDROmorphone  (DILAUDID ) injection 0.5 mg (0.5 mg Intravenous Given 07/26/23 1304)  sodium chloride  0.9 % bolus 1,000 mL (0 mLs Intravenous Stopped 07/26/23 1508)  iohexol  (OMNIPAQUE ) 300 MG/ML solution 100 mL (100 mLs Intravenous Contrast Given 07/26/23 1451)    ED Course/ Medical Decision Making/ A&P                                 Medical Decision Making Amount and/or Complexity of Data Reviewed Labs: ordered. Radiology: ordered.  Risk OTC drugs. Prescription drug management. Decision regarding hospitalization.   This patient presents to the ED for concern of left lower quadrant abdominal pain, this involves an  extensive number of treatment options, and is a complaint that carries with it a high risk of complications and morbidity.  The differential diagnosis includes diverticulitis, pyelonephritis, ovarian torsion or cyst, small bowel obstruction, pancreatitis, mesenteric ischemia   Co morbidities that complicate the patient evaluation  Previous diverticulitis   Additional history obtained:  Additional history obtained from none External records from outside source obtained and reviewed including medical records   Lab Tests:  I Ordered, and personally interpreted labs.  The pertinent results include: Leukocytosis, normal lactic acid, normal lipase, normal electrolytes, kidney function, liver function, no anemia   Imaging Studies ordered:  I ordered imaging studies including CT scan of the abdomen and pelvis I independently visualized and interpreted imaging which showed mildly progressed diverticulitis, no abscess summation I agree with the radiologist interpretation    Consultations Obtained:  I requested consultation with the hospitalist,  and discussed lab and imaging findings as well as pertinent plan - they recommend: Admission   Problem List / ED Course / Critical interventions / Medication management  Patient is doing well at this time and does remain stable.  Discussed with patient we will plan for admission to the hospital service given her intractable pain as well as ongoing nausea and vomiting at home.  Do suspect that she will continue to have progression of her symptoms that she is discharged home at this time.  Patient had no indication for any surgical findings on CT scan of the abdomen and pelvis.  Patient's blood work does indicate a worsening leukocytosis but does not appear to be septic at this time.  Did  discuss patient case with Dr. Yvonne Hering who has excepted for admission at this time. I ordered medication including Zosyn, Dilaudid , IV fluids for abdominal pain,  diverticulitis Reevaluation of the patient after these medicines showed that the patient improved I have reviewed the patients home medicines and have made adjustments as needed   Social Determinants of Health:  None   Test / Admission - Considered:  Admission        Final Clinical Impression(s) / ED Diagnoses Final diagnoses:  None    Rx / DC Orders ED Discharge Orders     None         Emmalene Hare 07/26/23 1708    Ninetta Basket, MD 07/29/23 1016

## 2023-07-26 NOTE — Care Management Obs Status (Signed)
 MEDICARE OBSERVATION STATUS NOTIFICATION   Patient Details  Name: TOREY CHESTNUT MRN: [id number] Date of Birth: 1955-12-27   Medicare Observation Status Notification Given:   Yes.    Lynda Sands, RN 07/26/2023, 6:38 PM

## 2023-07-26 NOTE — H&P (Signed)
 History and Physical  [name] VOZ:[id number] DOB: 02-15-1956 DOA: 07/26/2023  PCP: Delfina Feller, FNP   Chief Complaint: Abdominal pain, nausea and vomiting  HPI: [name] is a 68 y.o. female with medical history significant for bipolar 1 disorder, anxiety and depression, chronic diastolic heart failure, COPD, HLD, HTN, diverticulitis, fibromyalgia, GERD, T2DM, insomnia, spinal stenosis and seizure disorder that presents back to the ED for evaluation of abdominal pain, nausea and vomiting.  Patient was initially seen in the ED on 07/04/2023 found to have diverticulitis on abdominal imaging.  She was able to tolerate p.o. intake so she was discharged from the ED with Augmentin , as needed Reglan  and instructions to follow-up with PCP.  Patient reports that her abdominal pain has not responded to her home opioids and she has continued to have persistent nausea and vomiting.  She returns to the ED today due to inability to keep anything down and severe abdominal pain.  She reports dry mouth and constipation with last bowel movement 2 days ago but denies any fevers, chills, hematemesis, bloody stools, dysuria, shortness of breath or chest pain.  ED Course: Stable vitals in the ED. labs showed normal lipase, CMP, leukocytosis of 12.8 but normal hemoglobin and normal lactic acid.  UA shows significant glucosuria but no signs of infection. Repeat CT AMP shows noncomplicated sigmoid diverticulitis with minimal progression. Patient received IV Dilaudid  for pain, IV NS 1 L bolus, Senokot-S tablet, IV Zofran  and IV Zosyn. GI which was positive for admission.  Review of Systems: Please see HPI for pertinent positives and negatives. A complete 10 system review of systems are otherwise negative.  Past Medical History:  Diagnosis Date   Allergy    Anemia    Anginal pain (HCC)    last time    Anxiety    Arthritis    RHEUMATOID   Asthma    Bipolar 1 disorder (HCC)    Blood transfusion  without reported diagnosis    x 3   Cataracts, bilateral 07/2017   CHF (congestive heart failure) (HCC)    COPD (chronic obstructive pulmonary disease) (HCC)    Coronary artery disease    reported hx of "MI";  Echo 2009 with normal LVF;  Myoview  05/2011: no ischemia   Depression    Diabetes mellitus without complication (HCC)    Dyslipidemia    Dysrhythmia    SVT   Elevated liver enzymes 06/30/2022   Esophageal stricture    Fibromyalgia    GERD (gastroesophageal reflux disease)    H/O hiatal hernia    Head injury, unspecified    Headache    migraines   Herpes simplex infection    History of kidney stones    History of loop recorder 08/10/2017   Managed by Dr. Jalene Mayor   Hyperlipidemia    Hypertension    Insomnia    Myocardial infarction [facility])    age 72   Osteoporosis    Pneumonia    hx   Seizures (HCC)    none in the last 3-4 years as of 11/03/22 per patient   Shortness of breath    Sleep apnea    mild, does not require positive pressure treatment. (02/03/22)   Spinal stenosis of lumbar region    Spondylolisthesis    Status post placement of implantable loop recorder    Supraventricular tachycardia (HCC)    Syncope and collapse    s/p ILR; no arhythmogenic cause identified   UTI (lower urinary tract infection)  Past Surgical History:  Procedure Laterality Date   BACK SURGERY     BREAST EXCISIONAL BIOPSY Right    1990s   BREAST SURGERY     lumpectomy   CARDIAC CATHETERIZATION  10/06/2011   CATARACT EXTRACTION W/PHACO Right 07/31/2017   Procedure: CATARACT EXTRACTION PHACO AND INTRAOCULAR LENS PLACEMENT (IOC);  Surgeon: Tarri Farm, MD;  Location: [facility];  Service: Ophthalmology;  Laterality: Right;  CDE: 2.33   CATARACT EXTRACTION W/PHACO Left 08/14/2017   Procedure: CATARACT EXTRACTION PHACO AND INTRAOCULAR LENS PLACEMENT (IOC);  Surgeon: Tarri Farm, MD;  Location: [facility];  Service: Ophthalmology;  Laterality: Left;  CDE: 2.74   CHOLECYSTECTOMY      CYSTOSCOPY     stone   DIAGNOSTIC LAPAROSCOPY     laparoscopic cholecystectomy   DOPPLER ECHOCARDIOGRAPHY  2009   ESOPHAGOGASTRODUODENOSCOPY N/A 10/31/2020   Procedure: ESOPHAGOGASTRODUODENOSCOPY (EGD);  Surgeon: Aldean Hummingbird, MD;  Location: [facility];  Service: General;  Laterality: N/A;   EYE SURGERY Bilateral 08/14/2017   cataract removal   GLUTEUS MINIMUS REPAIR Left 11/04/2022   Procedure: LEFT GLUTEUS MEDIUS REPAIR;  Surgeon: Wilhelmenia Harada, MD;  Location: [facility];  Service: Orthopedics;  Laterality: Left;   head up tilt table testing  06/15/2007   [name]   HEMORRHOID SURGERY     HERNIA REPAIR     insertion of implatable loop recorder  08/11/2007   [name]   POSTERIOR CERVICAL FUSION/FORAMINOTOMY N/A 12/19/2013   Procedure: RIGHT C3-4.C4-5 AND C5-6 FORAMINOTOMIES;  Surgeon: Alphonso Jean, MD;  Location: [facility] OR;  Service: Orthopedics;  Laterality: N/A;   TOTAL HIP ARTHROPLASTY Left 01/31/2020   Procedure: LEFT TOTAL HIP ARTHROPLASTY ANTERIOR APPROACH;  Surgeon: Arnie Lao, MD;  Location: [facility] OR;  Service: Orthopedics;  Laterality: Left;   TOTAL SHOULDER ARTHROPLASTY Right 11/15/2019   Procedure: RIGHT TOTAL SHOULDER ARTHROPLASTY;  Surgeon: Jasmine Mesi, MD;  Location: [facility];  Service: Orthopedics;  Laterality: Right;   TOTAL SHOULDER REVISION Right 06/11/2021   Procedure: RIGHT SHOULDER CONVERSION TOTAL SHOULDER ARTHROPLASTY to REVERSE TOTAL SHOULDER ARTHROPLASTY;  Surgeon: Jasmine Mesi, MD;  Location: [facility] OR;  Service: Orthopedics;  Laterality: Right;   TUBAL LIGATION     UPPER GASTROINTESTINAL ENDOSCOPY     XI ROBOTIC ASSISTED HIATAL HERNIA REPAIR N/A 10/31/2020   Procedure: XI ROBOTIC ASSISTED HIATAL HERNIA REPAIR WITH PARTIAL FUNDOPLICATION;  Surgeon: Aldean Hummingbird, MD;  Location: [facility];  Service: General;  Laterality: N/A;   Social History:  reports that she has never smoked. She has never used smokeless tobacco. She reports that she does not  drink alcohol  and does not use drugs.  Allergies  Allergen Reactions   Codeine  Other (See Comments)    "I will have a heart attack."   Morphine And Codeine  Other (See Comments)    "It will cause me to have a heart attack."   Ambien  [Zolpidem  Tartrate] Nausea And Vomiting   Clonidine  Derivatives Nausea And Vomiting    gerd - caused acid reflux per pt   Metformin  And Related Nausea And Vomiting    cramping from metformin    Lyrica  [Pregabalin ] Swelling and Other (See Comments)    Weight gain   Neurontin [Gabapentin] Other (See Comments)    Causes elevated LFTs     Family History  Problem Relation Age of Onset   Heart attack Father    Mental illness Father    Mental illness Mother    Heart attack Brother  stents   Alcohol  abuse Brother    Heart disease Brother    Drug abuse Brother    Diabetes Brother    Colon cancer Maternal Aunt    Cirrhosis Brother    Stomach cancer Neg Hx    Esophageal cancer Neg Hx    Rectal cancer Neg Hx      Prior to Admission medications   Medication Sig Start Date End Date Taking? Authorizing Provider  Accu-Chek Softclix Lancets lancets test blood sugar up to 4 times a day as directed. Dx R73.03 03/02/23   Delfina Feller, FNP  acyclovir  ointment (ZOVIRAX ) 5 % apply 1 application topically every 3 (three) hours as needed (outbreaks). 07/23/23   Gaylyn Keas [name], FNP  albuterol  (VENTOLIN  HFA) 108 (90 Base) MCG/ACT inhaler inhale 2 puffs every 6 hours as needed for shortness of breath and wheezing. 04/16/23   Delfina Feller, FNP  Alcohol  Swabs  (B-D SINGLE USE SWABS  REGULAR) PADS USE 1 PAD DAILY WHEN CHECKING BLOOD SUGAR. R73.03 06/26/22   Gaylyn Keas, [name], FNP  amoxicillin -clavulanate (AUGMENTIN ) 875-125 MG tablet Take 1 tablet by mouth every 12 (twelve) hours. 07/24/23   Ninetta Basket, MD  blood glucose meter kit and supplies Dispense based on patient and insurance preference. Use up to four times daily as directed. (FOR  ICD-10 E10.9, E11.9). 02/04/21   Gaylyn Keas [name], FNP  busPIRone  (BUSPAR ) 10 MG tablet Take 1 tablet (10 mg total) by mouth 2 (two) times daily as needed. 06/16/23   Delfina Feller, FNP  butalbital -acetaminophen -caffeine  (FIORICET ) 50-325-40 MG tablet take 1 tablet by mouth every 6 (six) hours as needed for headache. 07/23/23   Gaylyn Keas, [name], FNP  carvedilol  (COREG ) 3.125 MG tablet TAKE 1/2 (0.5) TABLET TWICE A DAY WITH A MEAL; BLOOD PRESSURE OVER 150 BEFORE TAKING. 06/16/23   Gaylyn Keas, [name], FNP  celecoxib  (CELEBREX ) 200 MG capsule Take 1 capsule (200 mg total) by mouth daily as needed. 06/16/23   Gaylyn Keas [name], FNP  dexlansoprazole  (DEXILANT ) 60 MG capsule Take 1 capsule (60 mg total) by mouth daily. 06/16/23   Gaylyn Keas, [name], FNP  Dextromethorphan-GG-APAP (CORICIDIN HBP COLD/COUGH/FLU) 10-200-325 MG/15ML LIQD Take 30 mLs by mouth every 4 (four) hours as needed (cough). 02/03/23   Milian, Winda Hastings, FNP  diclofenac  Sodium (VOLTAREN ) 1 % GEL APPLY 4 GRAMS TOPICALLY 4 TIMES A DAY. Patient taking differently: Apply 4 g topically 4 (four) times daily as needed (pain). 03/01/21   Gaylyn Keas [name], FNP  DULoxetine  (CYMBALTA ) 60 MG capsule Take 1 capsule (60 mg total) by mouth at bedtime. 06/16/23   Gaylyn Keas [name], FNP  empagliflozin  (JARDIANCE ) 25 MG TABS tablet Take 1 tablet (25 mg total) by mouth daily. 06/16/23   Gaylyn Keas [name], FNP  escitalopram  (LEXAPRO ) 20 MG tablet Take 1 tablet (20 mg total) by mouth daily. 06/16/23   Gaylyn Keas, [name], FNP  estradiol  (ESTRACE ) 0.1 MG/GM vaginal cream Place 1 Applicatorful vaginally 3 (three) times a week. 03/20/23   Gaylyn Keas, [name], FNP  FIBER PO Take 1 tablet by mouth in the morning and at bedtime.    [provider]  fluconazole  (DIFLUCAN ) 150 MG tablet 1 po q week x 4 weeks 07/21/23   Gaylyn Keas, [name], FNP  fludrocortisone  (FLORINEF ) 0.1 MG tablet take 1 tablet (100mcg total) by  mouth every other day. 07/23/23   Gaylyn Keas [name], FNP  fluticasone  (FLONASE ) 50 MCG/ACT nasal spray USE 1 SPRAY IN EACH NOSTRIL ONCE DAILY. Patient taking differently: Place 1 spray into both nostrils daily as needed for allergies. 04/04/20   Delfina Feller, FNP  fluticasone  furoate-vilanterol (BREO ELLIPTA ) 100-25 MCG/ACT AEPB Inhale 1 puff by mouth once daily 04/22/23   Gaylyn Keas, [name], FNP  furosemide  (LASIX ) 40 MG tablet TAKE 1 TABLET DAILY AS NEEDED FOR EXCESSIVE FLUID. 06/16/23   Gaylyn Keas, [name], FNP  Glucagon , rDNA, (GLUCAGON  EMERGENCY) 1 MG KIT Inject 1 mg into the skin as needed. 06/26/22   Gaylyn Keas, [name], FNP  glucose blood (ACCU-CHEK GUIDE) test strip TEST BLOOD SUGAR UP TO 4 TIMES A DAY AS DIRECTED. R73.03 09/24/22   Gaylyn Keas, [name], FNP  hydrALAZINE  (APRESOLINE ) 25 MG tablet take 1 tablet by mouth 3 times daily as needed (for severe hypertension/systolic number 170 or greater). 06/22/23   Laurann Pollock, MD  HYDROcodone -acetaminophen  (NORCO) 7.5-325 MG tablet Take 1 tablet by mouth in the morning, at noon, and at bedtime. 08/14/23 09/13/23  Delfina Feller, FNP  HYDROcodone -acetaminophen  (NORCO) 7.5-325 MG tablet Take 1 tablet by mouth in the morning, at noon, and at bedtime. 07/16/23 08/15/23  Gaylyn Keas, [name], FNP  ipratropium (ATROVENT ) 0.02 % nebulizer solution USE 1 VIAL ( ) IN NEBULIZER EVERY 6 HOURS AS NEEDED FOR WHEEZING OR SHORTNESS OF BREATH. 06/28/20   Gaylyn Keas, [name], FNP  isosorbide  mononitrate (IMDUR ) 60 MG 24 hr tablet Take 1 tablet (60 mg total) by mouth 2 (two) times daily. 06/16/23   Gaylyn Keas [name], FNP  lamoTRIgine  (LAMICTAL ) 150 MG tablet Take 1 tablet (150 mg total) by mouth daily. 03/19/23   Gaylyn Keas [name], FNP  levETIRAcetam  (KEPPRA ) 500 MG tablet Take 1 tablet (500 mg total) by mouth 2 (two) times daily. 06/16/23   Delfina Feller, FNP  levocetirizine (XYZAL ) 5 MG tablet take 1 tablet by mouth every  morning. 06/22/23   Gaylyn Keas, [name], FNP  lidocaine  (LIDODERM ) 5 % place 1 patch onto the skin daily. remove & discard patch within 12 hours or as directed by md 06/22/23   Delfina Feller, FNP  lidocaine  (XYLOCAINE ) 5 % ointment apply to affected area 3 times a day as needed for mild or moderate pain. 04/16/23   Gaylyn Keas [name], FNP  methocarbamol  (ROBAXIN ) 500 MG tablet take 1 tablet (500 milligram total) by mouth 4 (four) times daily. 06/22/23   Gaylyn Keas [name], FNP  metoCLOPramide  (REGLAN ) 10 MG tablet Take 1 tablet (10 mg total) by mouth every 6 (six) hours. 07/24/23   Ninetta Basket, MD  naproxen  (NAPROSYN ) 500 MG tablet Take 1 tablet (500 mg total) by mouth 2 (two) times daily. 06/05/23   Barrett, Jamie N, PA-C  nitroGLYCERIN  (NITROSTAT ) 0.4 MG SL tablet place one (1) tablet under tongue every 5 minutes up to (3) doses as needed for chest pain. 06/22/23   Delfina Feller, FNP  ondansetron  (ZOFRAN ) 4 MG tablet take 1 tablet by mouth every 8 hours as needed for nausea and vomiting. 07/24/23   Gaylyn Keas, [name], FNP  oxybutynin  (DITROPAN -XL) 5 MG 24 hr tablet Take 1 tablet (5 mg total) by mouth at bedtime. 11/26/21   Gaylyn Keas [name], FNP  Probiotic Product (PROBIOTIC PO) Take 1 capsule by mouth in the morning.    [provider]  promethazine -dextromethorphan (PROMETHAZINE -DM) 6.25-15 MG/5ML syrup Take 5 mLs by mouth 3 (three) times daily as needed for cough. 04/03/23   Yevette Hem, FNP  rosuvastatin  (CRESTOR ) 10 MG tablet Take 1 tablet (10 mg total) by mouth daily. 06/16/23   Gaylyn Keas [name], FNP  traZODone  (DESYREL ) 150 MG tablet Take 1 tablet (150 mg total) by mouth at bedtime. 06/16/23   Gaylyn Keas [name], FNP  valACYclovir  (VALTREX ) 1000 MG tablet Take 1 tablet (1,000  mg total) by mouth 3 (three) times daily as needed (with active outbreaks.). 06/16/23   Gaylyn Keas, [name], FNP  valACYclovir  (VALTREX ) 500 MG tablet TAKE (1) TABLET TWICE  DAILY. 03/19/23   Delfina Feller, FNP    Physical Exam: BP 118/70 (BP Location: Left Arm)   Pulse 81   Temp 99.2 F (37.3 C) (Oral)   Resp 18   Ht 5\' 2"  (1.575 m)   Wt 54.4 kg   SpO2 100%   BMI 21.95 kg/m  General: Pleasant, well-appearing elderly woman laying in bed. No acute distress. HEENT: [place]/AT. Anicteric sclera. Dry mucous membrane CV: RRR. No murmurs, rubs, or gallops. No LE edema Pulmonary: Lungs CTAB. Normal effort. No wheezing or rales. Abdominal: Soft, nondistended. Mild ttp of the LLQ. Normal bowel sounds. Extremities: Palpable radial and DP pulses. Normal ROM. Skin: Warm and dry. No obvious rash or lesions. Decreased skin turgor. Neuro: A&Ox3. Moves all extremities. Normal sensation to light touch. No focal deficit. Psych: Normal mood and affect          Labs on Admission:  Basic Metabolic Panel: Recent Labs  Lab 07/24/23 1013 07/26/23 1230  NA 137 135  K 3.7 3.6  CL 99 97*  CO2 26 24  GLUCOSE 115* 97  BUN 13 18  CREATININE 0.89 0.84  CALCIUM  9.3 9.4   Liver Function Tests: Recent Labs  Lab 07/24/23 1013 07/26/23 1230  AST 20 15  ALT 16 11  ALKPHOS 97 95  BILITOT 0.6 1.0  PROT 7.4 7.5  ALBUMIN  3.9 3.5   Recent Labs  Lab 07/24/23 1013 07/26/23 1230  LIPASE 24 21   No results for input(s): "AMMONIA" in the last 168 hours. CBC: Recent Labs  Lab 07/24/23 1013 07/26/23 1230  WBC 10.5 12.8*  NEUTROABS 7.8* 10.0*  HGB 14.9 14.1  HCT 46.1* 44.1  MCV 97.7 97.8  PLT 160 169   Cardiac Enzymes: No results for input(s): "CKTOTAL", "CKMB", "CKMBINDEX", "TROPONINI" in the last 168 hours. BNP (last 3 results) No results for input(s): "BNP" in the last 8760 hours.  ProBNP (last 3 results) No results for input(s): "PROBNP" in the last 8760 hours.  CBG: No results for input(s): "GLUCAP" in the last 168 hours.  Radiological Exams on Admission: CT ABDOMEN PELVIS W CONTRAST Result Date: 07/26/2023 CLINICAL DATA:  Left lower quadrant  pain and emesis. EXAM: CT ABDOMEN AND PELVIS WITH CONTRAST TECHNIQUE: Multidetector CT imaging of the abdomen and pelvis was performed using the standard protocol following bolus administration of intravenous contrast. RADIATION DOSE REDUCTION: This exam was performed according to the departmental dose-optimization program which includes automated exposure control, adjustment of the mA and/or kV according to patient size and/or use of iterative reconstruction technique. CONTRAST:  OMNIPAQUE  IOHEXOL  300 MG/ML  SOLN COMPARISON:  07/24/2023 FINDINGS: Lower chest: Bibasilar scarring. Normal heart size without pericardial or pleural effusion. Tiny hiatal hernia. Hepatobiliary: Normal liver. Cholecystectomy. Borderline intrahepatic duct dilatation. Common duct 11 mm on 34/2, similar. Pancreas: Normal, without mass or ductal dilatation. Spleen: Normal in size, without focal abnormality. Adrenals/Urinary Tract: Normal adrenal glands. Bilateral renal sinus cysts. Too small to characterize upper pole left renal lesion is most likely a cyst . In the absence of clinically indicated signs/symptoms require(s) no independent follow-up. no hydronephrosis. Degraded evaluation of the pelvis, secondary to beam hardening artifact from left hip arthroplasty. Grossly normal urinary bladder. Stomach/Bowel: Apparent gastric antral wall thickening is likely due to underdistention; absent 2 days ago. Extensive colonic diverticulosis. Sigmoid wall thickening  again identified. Mild pericolonic edema adjacent the sigmoid is increased, including on 71/2. No free perforation or drainable abscess. Colonic stool burden suggests constipation. Normal terminal ileum and appendix.  Normal small bowel. Vascular/Lymphatic: Aortic atherosclerosis. No abdominopelvic adenopathy. Reproductive: Grossly normal uterus, without adnexal mass. Other: Trace pelvic fluid, increased.  No abdominal ascites. Musculoskeletal: Left hip arthroplasty. Incompletely  imaged right shoulder arthroplasty. L4-5 trans pedicle screw fixation. Moderate T12 compression deformity with mild ventral canal encroachment, similar. IMPRESSION: 1. Noncomplicated sigmoid diverticulitis, minimally progressive. 2. Status post cholecystectomy with mild intra and extrahepatic biliary duct dilatation. Correlate with bilirubin levels. Consider nonemergent MRCP or ERCP if elevated. 3.  Possible constipation. 4.  Aortic Atherosclerosis (ICD10-I70.0). 5.  Tiny hiatal hernia. Electronically Signed   By: Lore Rode M.D.   On: 07/26/2023 16:38   Assessment/Plan [name] is a 68 y.o. female with medical history significant for bipolar 1 disorder, anxiety and depression, chronic diastolic heart failure, COPD, HLD, HTN, diverticulitis, fibromyalgia, osteoarthritis, GERD, T2DM, insomnia, spinal stenosis and seizure disorder that presents back to the ED for evaluation of abdominal pain, nausea and vomiting and admitted for diverticulitis.  # Acute sigmoid diverticulitis - Long history of diverticulitis here with persistent abdominal pain, nausea and vomiting - Failed outpatient therapy - Admit for observation, bowel rest and pain control - S/p IV Zosyn in the ED, start IV Rocephin  and Flagyl  - IV NS at 100 cc/h while n.p.o. - Pain control with home Norco and PRN IV Dilaudid  - Can advance diet as tolerated tomorrow - Trend CBC, fever curve  # HTN - BP soft with SBP in the 100s to 110s -Hold BP meds for now  # T2DM - Last A1c 5.7% 10 months ago -Hold home Jardiance  -Q4h SSI with CBG monitoring while NPO -Follow-up repeat A1c  # Chronic diastolic HF - Patient dry on exam - Hold home Lasix   # COPD - Stable - Continue home bronchodilators  # Bipolar 1 disorder # Anxiety and depression - Continue BuSpar , duloxetine  and Lexapro  - Continue Keppra  and Lamictal   # Seizure disorder - Continue Lamictal  and Keppra   # Fibromyalgia # Osteoarthritis # Spinal stenosis -  Continue home Norco and PRN IV Dilaudid  - Continue home Robaxin , Celebrex , Voltaren  gel and duloxetine   # GERD - Continue Protonix   # HLD - Continue rosuvastatin   # Hx of herpes - Continue Valtrex   # Insomnia - Continue trazodone   DVT prophylaxis: Lovenox      Code Status: Full Code  Consults called: None  Family Communication: Discussed admission with daughter at bedside  Severity of Illness: The appropriate patient status for this patient is OBSERVATION. Observation status is judged to be reasonable and necessary in order to provide the required intensity of service to ensure the patient's safety. The patient's presenting symptoms, physical exam findings, and initial radiographic and laboratory data in the context of their medical condition is felt to place them at decreased risk for further clinical deterioration. Furthermore, it is anticipated that the patient will be medically stable for discharge from the hospital within 2 midnights of admission.   Level of care: Med-Surg   This record has been created using Conservation officer, historic buildings. Errors have been sought and corrected, but may not always be located. Such creation errors do not reflect on the standard of care.   Vita Grip, MD 07/26/2023, 7:55 PM [facility] Pager: [phone] Isaiah 41:10   If 7PM-7AM, please contact night-coverage [email or web address] Password [email or web address]

## 2023-07-26 NOTE — ED Triage Notes (Signed)
 Pt with continued abd pain and emesis, not able to take medications prescribed when she was seen for same on 4/25. Pt states it's getting worse.

## 2023-07-26 NOTE — ED Notes (Signed)
 ED TO INPATIENT HANDOFF REPORT  ED Nurse Name and Phone #: Arnet Hofferber  S Name/Age/Gender [name] 68 y.o. female Room/Bed: APA06/APA06  Code Status   Code Status: Full Code  Home/SNF/Other Home Patient oriented to: self, place, time, and situation Is this baseline? Yes   Triage Complete: Triage complete  Chief Complaint Diverticulitis [K57.92]  Triage Note Pt with continued abd pain and emesis, not able to take medications prescribed when she was seen for same on 4/25. Pt states it's getting worse.    Allergies Allergies  Allergen Reactions   Codeine  Other (See Comments)    "I will have a heart attack."   Morphine And Codeine  Other (See Comments)    "It will cause me to have a heart attack."   Ambien  [Zolpidem  Tartrate] Nausea And Vomiting   Clonidine  Derivatives Nausea And Vomiting    gerd - caused acid reflux per pt   Metformin  And Related Nausea And Vomiting    cramping from metformin    Lyrica  [Pregabalin ] Swelling and Other (See Comments)    Weight gain   Neurontin [Gabapentin] Other (See Comments)    Causes elevated LFTs     Level of Care/Admitting Diagnosis ED Disposition     ED Disposition  Admit   Condition  --   Comment  Hospital Area: [facility] [[id number]]  Level of Care: Med-Surg [16]  Covid Evaluation: Asymptomatic - no recent exposure (last 10 days) testing not required  Diagnosis: Diverticulitis [[id number]]  Admitting Physician: Vita Grip [[id number]]  Attending Physician: Vita Grip [[id number]]          B Medical/Surgery History Past Medical History:  Diagnosis Date   Allergy    Anemia    Anginal pain (HCC)    last time    Anxiety    Arthritis    RHEUMATOID   Asthma    Bipolar 1 disorder (HCC)    Blood transfusion without reported diagnosis    x 3   Cataracts, bilateral 07/2017   CHF (congestive heart failure) (HCC)    COPD (chronic obstructive pulmonary disease) (HCC)    Coronary artery disease     reported hx of "MI";  Echo 2009 with normal LVF;  Myoview  05/2011: no ischemia   Depression    Diabetes mellitus without complication (HCC)    Dyslipidemia    Dysrhythmia    SVT   Elevated liver enzymes 06/30/2022   Esophageal stricture    Fibromyalgia    GERD (gastroesophageal reflux disease)    H/O hiatal hernia    Head injury, unspecified    Headache    migraines   Herpes simplex infection    History of kidney stones    History of loop recorder 08/10/2017   Managed by Dr. Jalene Mayor   Hyperlipidemia    Hypertension    Insomnia    Myocardial infarction [facility])    age 58   Osteoporosis    Pneumonia    hx   Seizures (HCC)    none in the last 3-4 years as of 11/03/22 per patient   Shortness of breath    Sleep apnea    mild, does not require positive pressure treatment. (02/03/22)   Spinal stenosis of lumbar region    Spondylolisthesis    Status post placement of implantable loop recorder    Supraventricular tachycardia (HCC)    Syncope and collapse    s/p ILR; no arhythmogenic cause identified   UTI (lower urinary tract infection)  Past Surgical History:  Procedure Laterality Date   BACK SURGERY     BREAST EXCISIONAL BIOPSY Right    1990s   BREAST SURGERY     lumpectomy   CARDIAC CATHETERIZATION  10/06/2011   CATARACT EXTRACTION W/PHACO Right 07/31/2017   Procedure: CATARACT EXTRACTION PHACO AND INTRAOCULAR LENS PLACEMENT (IOC);  Surgeon: Tarri Farm, MD;  Location: [facility];  Service: Ophthalmology;  Laterality: Right;  CDE: 2.33   CATARACT EXTRACTION W/PHACO Left 08/14/2017   Procedure: CATARACT EXTRACTION PHACO AND INTRAOCULAR LENS PLACEMENT (IOC);  Surgeon: Tarri Farm, MD;  Location: [facility];  Service: Ophthalmology;  Laterality: Left;  CDE: 2.74   CHOLECYSTECTOMY     CYSTOSCOPY     stone   DIAGNOSTIC LAPAROSCOPY     laparoscopic cholecystectomy   DOPPLER ECHOCARDIOGRAPHY  2009   ESOPHAGOGASTRODUODENOSCOPY N/A 10/31/2020   Procedure:  ESOPHAGOGASTRODUODENOSCOPY (EGD);  Surgeon: Aldean Hummingbird, MD;  Location: [facility];  Service: General;  Laterality: N/A;   EYE SURGERY Bilateral 08/14/2017   cataract removal   GLUTEUS MINIMUS REPAIR Left 11/04/2022   Procedure: LEFT GLUTEUS MEDIUS REPAIR;  Surgeon: Wilhelmenia Harada, MD;  Location: [facility] OR;  Service: Orthopedics;  Laterality: Left;   head up tilt table testing  06/15/2007   [name]   HEMORRHOID SURGERY     HERNIA REPAIR     insertion of implatable loop recorder  08/11/2007   [name]   POSTERIOR CERVICAL FUSION/FORAMINOTOMY N/A 12/19/2013   Procedure: RIGHT C3-4.C4-5 AND C5-6 FORAMINOTOMIES;  Surgeon: Alphonso Jean, MD;  Location: [facility];  Service: Orthopedics;  Laterality: N/A;   TOTAL HIP ARTHROPLASTY Left 01/31/2020   Procedure: LEFT TOTAL HIP ARTHROPLASTY ANTERIOR APPROACH;  Surgeon: Arnie Lao, MD;  Location: [facility] OR;  Service: Orthopedics;  Laterality: Left;   TOTAL SHOULDER ARTHROPLASTY Right 11/15/2019   Procedure: RIGHT TOTAL SHOULDER ARTHROPLASTY;  Surgeon: Jasmine Mesi, MD;  Location: [facility];  Service: Orthopedics;  Laterality: Right;   TOTAL SHOULDER REVISION Right 06/11/2021   Procedure: RIGHT SHOULDER CONVERSION TOTAL SHOULDER ARTHROPLASTY to REVERSE TOTAL SHOULDER ARTHROPLASTY;  Surgeon: Jasmine Mesi, MD;  Location: [facility] OR;  Service: Orthopedics;  Laterality: Right;   TUBAL LIGATION     UPPER GASTROINTESTINAL ENDOSCOPY     XI ROBOTIC ASSISTED HIATAL HERNIA REPAIR N/A 10/31/2020   Procedure: XI ROBOTIC ASSISTED HIATAL HERNIA REPAIR WITH PARTIAL FUNDOPLICATION;  Surgeon: Aldean Hummingbird, MD;  Location: [facility];  Service: General;  Laterality: N/A;     A IV Location/Drains/Wounds Patient Lines/Drains/Airways Status     Active Line/Drains/Airways     Name Placement date Placement time Site Days   Peripheral IV 07/26/23 22 G Anterior;Proximal;Right;Upper Arm 07/26/23  1258  Arm  less than 1            Intake/Output Last 24  hours No intake or output data in the 24 hours ending 07/26/23 1744  Labs/Imaging Results for orders placed or performed during the hospital encounter of 07/26/23 (from the past 48 hours)  Comprehensive metabolic panel     Status: Abnormal   Collection Time: 07/26/23 12:30 PM  Result Value Ref Range   Sodium 135 135 - 145 mmol/L   Potassium 3.6 3.5 - 5.1 mmol/L   Chloride 97 (L) 98 - 111 mmol/L   CO2 24 22 - 32 mmol/L   Glucose, Bld 97 70 - 99 mg/dL    Comment: Glucose reference range applies only to samples taken after fasting for at least 8 hours.   BUN  18 8 - 23 mg/dL   Creatinine, Ser 1.61 0.44 - 1.00 mg/dL   Calcium  9.4 8.9 - 10.3 mg/dL   Total Protein 7.5 6.5 - 8.1 g/dL   Albumin  3.5 3.5 - 5.0 g/dL   AST 15 15 - 41 U/L   ALT 11 0 - 44 U/L   Alkaline Phosphatase 95 38 - 126 U/L   Total Bilirubin 1.0 0.0 - 1.2 mg/dL   GFR, Estimated >09 >60 mL/min    Comment: (NOTE) Calculated using the CKD-EPI Creatinine Equation (2021)    Anion gap 14 5 - 15    Comment: Performed at [facility], [address] [place], [place] [address]  Lipase, blood     Status: None   Collection Time: 07/26/23 12:30 PM  Result Value Ref Range   Lipase 21 11 - 51 U/L    Comment: Performed at [facility], [address] [place], [place] [address]  Lactic acid, plasma     Status: None   Collection Time: 07/26/23 12:30 PM  Result Value Ref Range   Lactic Acid, Venous 1.3 0.5 - 1.9 mmol/L    Comment: Performed at [facility], [address], [place], [place] [address]  CBC with Differential     Status: Abnormal   Collection Time: 07/26/23 12:30 PM  Result Value Ref Range   WBC 12.8 (H) 4.0 - 10.5 K/uL   RBC 4.51 3.87 - 5.11 MIL/uL   Hemoglobin 14.1 12.0 - 15.0 g/dL   HCT 56.2 13.0 - 86.5 %   MCV 97.8 80.0 - 100.0 fL   MCH 31.3 26.0 - 34.0 pg   MCHC 32.0 30.0 - 36.0 g/dL   RDW 78.4 69.6 - 29.5 %   Platelets 169 150 - 400 K/uL   nRBC 0.0 0.0 - 0.2 %   Neutrophils Relative % 79 %   Neutro Abs  10.0 (H) 1.7 - 7.7 K/uL   Lymphocytes Relative 10 %   Lymphs Abs 1.3 0.7 - 4.0 K/uL   Monocytes Relative 11 %   Monocytes Absolute 1.4 (H) 0.1 - 1.0 K/uL   Eosinophils Relative 0 %   Eosinophils Absolute 0.0 0.0 - 0.5 K/uL   Basophils Relative 0 %   Basophils Absolute 0.0 0.0 - 0.1 K/uL   Immature Granulocytes 0 %   Abs Immature Granulocytes 0.04 0.00 - 0.07 K/uL    Comment: Performed at [facility], [address], [place], [place] [address]  Culture, blood (routine x 2)     Status: None (Preliminary result)   Collection Time: 07/26/23 12:30 PM   Specimen: BLOOD RIGHT HAND  Result Value Ref Range   Specimen Description      BLOOD RIGHT HAND BOTTLES DRAWN AEROBIC AND ANAEROBIC   Special Requests      Blood Culture adequate volume Performed at [facility], [address] [place], [place] [address]    Culture PENDING    Report Status PENDING   Culture, blood (routine x 2)     Status: None (Preliminary result)   Collection Time: 07/26/23 12:30 PM   Specimen: BLOOD RIGHT HAND  Result Value Ref Range   Specimen Description      BLOOD RIGHT HAND BOTTLES DRAWN AEROBIC AND ANAEROBIC   Special Requests      Blood Culture adequate volume Performed at [facility], [address] [place], [place] [address]    Culture PENDING    Report Status PENDING   Lactic acid, plasma     Status: None  Collection Time: 07/26/23  2:10 PM  Result Value Ref Range   Lactic Acid, Venous 1.0 0.5 - 1.9 mmol/L    Comment: Performed at [facility], [address] [place], [place] [address]   *Note: Due to a large number of results and/or encounters for the requested time period, some results have not been displayed. A complete set of results can be found in Results Review.   CT ABDOMEN PELVIS W CONTRAST Result Date: 07/26/2023 CLINICAL DATA:  Left lower quadrant pain and emesis. EXAM: CT ABDOMEN AND PELVIS WITH CONTRAST TECHNIQUE: Multidetector CT imaging of the abdomen and pelvis was performed using  the standard protocol following bolus administration of intravenous contrast. RADIATION DOSE REDUCTION: This exam was performed according to the departmental dose-optimization program which includes automated exposure control, adjustment of the mA and/or kV according to patient size and/or use of iterative reconstruction technique. CONTRAST:  OMNIPAQUE  IOHEXOL  300 MG/ML  SOLN COMPARISON:  07/24/2023 FINDINGS: Lower chest: Bibasilar scarring. Normal heart size without pericardial or pleural effusion. Tiny hiatal hernia. Hepatobiliary: Normal liver. Cholecystectomy. Borderline intrahepatic duct dilatation. Common duct 11 mm on 34/2, similar. Pancreas: Normal, without mass or ductal dilatation. Spleen: Normal in size, without focal abnormality. Adrenals/Urinary Tract: Normal adrenal glands. Bilateral renal sinus cysts. Too small to characterize upper pole left renal lesion is most likely a cyst . In the absence of clinically indicated signs/symptoms require(s) no independent follow-up. no hydronephrosis. Degraded evaluation of the pelvis, secondary to beam hardening artifact from left hip arthroplasty. Grossly normal urinary bladder. Stomach/Bowel: Apparent gastric antral wall thickening is likely due to underdistention; absent 2 days ago. Extensive colonic diverticulosis. Sigmoid wall thickening again identified. Mild pericolonic edema adjacent the sigmoid is increased, including on 71/2. No free perforation or drainable abscess. Colonic stool burden suggests constipation. Normal terminal ileum and appendix.  Normal small bowel. Vascular/Lymphatic: Aortic atherosclerosis. No abdominopelvic adenopathy. Reproductive: Grossly normal uterus, without adnexal mass. Other: Trace pelvic fluid, increased.  No abdominal ascites. Musculoskeletal: Left hip arthroplasty. Incompletely imaged right shoulder arthroplasty. L4-5 trans pedicle screw fixation. Moderate T12 compression deformity with mild ventral canal encroachment,  similar. IMPRESSION: 1. Noncomplicated sigmoid diverticulitis, minimally progressive. 2. Status post cholecystectomy with mild intra and extrahepatic biliary duct dilatation. Correlate with bilirubin levels. Consider nonemergent MRCP or ERCP if elevated. 3.  Possible constipation. 4.  Aortic Atherosclerosis (ICD10-I70.0). 5.  Tiny hiatal hernia. Electronically Signed   By: Lore Rode M.D.   On: 07/26/2023 16:38    Pending Labs Unresulted Labs (From admission, onward)     Start     Ordered   07/26/23 1715  HIV Antibody (routine testing w rflx)  (HIV Antibody (Routine testing w reflex) panel)  Once,   R        07/26/23 1717   07/26/23 1145  Urinalysis, Routine w reflex microscopic -Urine, Clean Catch  Once,   URGENT       Question:  Specimen Source  Answer:  Urine, Clean Catch   07/26/23 1144            Vitals/Pain Today's Vitals   07/26/23 1410 07/26/23 1415 07/26/23 1430 07/26/23 1658  BP: 109/66 (!) 104/58 105/67   Pulse:      Resp:      Temp:      TempSrc:      SpO2:      Weight:      Height:      PainSc:    10-Worst pain ever    Isolation Precautions  No active isolations  Medications Medications  enoxaparin  (LOVENOX ) injection 40 mg (has no administration in time range)  acetaminophen  (TYLENOL ) tablet 650 mg (has no administration in time range)    Or  acetaminophen  (TYLENOL ) suppository 650 mg (has no administration in time range)  senna-docusate (Senokot-S) tablet 1 tablet (has no administration in time range)  ondansetron  (ZOFRAN ) tablet 4 mg (has no administration in time range)    Or  ondansetron  (ZOFRAN ) injection 4 mg (has no administration in time range)  busPIRone  (BUSPAR ) tablet 10 mg (has no administration in time range)  DULoxetine  (CYMBALTA ) DR capsule 60 mg (has no administration in time range)  levETIRAcetam  (KEPPRA ) tablet 500 mg (has no administration in time range)  valACYclovir  (VALTREX ) tablet 500 mg (has no administration in time range)   lamoTRIgine  (LAMICTAL ) tablet 150 mg (has no administration in time range)  escitalopram  (LEXAPRO ) tablet 20 mg (has no administration in time range)  traZODone  (DESYREL ) tablet 150 mg (has no administration in time range)  rosuvastatin  (CRESTOR ) tablet 10 mg (has no administration in time range)  celecoxib  (CELEBREX ) capsule 200 mg (has no administration in time range)  pantoprazole  (PROTONIX ) EC tablet 40 mg (has no administration in time range)  diclofenac  Sodium (VOLTAREN ) 1 % topical gel 4 g (has no administration in time range)  fludrocortisone  (FLORINEF ) tablet 0.1 mg (has no administration in time range)  fluticasone  (FLONASE ) 50 MCG/ACT nasal spray 1 spray (has no administration in time range)  fluticasone  furoate-vilanterol (BREO ELLIPTA ) 100-25 MCG/ACT 1 puff (has no administration in time range)  methocarbamol  (ROBAXIN ) tablet 500 mg (has no administration in time range)  HYDROmorphone  (DILAUDID ) injection 0.5 mg (has no administration in time range)  0.9 %  sodium chloride  infusion (has no administration in time range)  piperacillin-tazobactam (ZOSYN) IVPB 3.375 g (0 g Intravenous Stopped 07/26/23 1508)  ondansetron  (ZOFRAN ) injection 4 mg (4 mg Intravenous Given 07/26/23 1302)  HYDROmorphone  (DILAUDID ) injection 0.5 mg (0.5 mg Intravenous Given 07/26/23 1304)  sodium chloride  0.9 % bolus 1,000 mL (0 mLs Intravenous Stopped 07/26/23 1508)  iohexol  (OMNIPAQUE ) 300 MG/ML solution 100 mL (100 mLs Intravenous Contrast Given 07/26/23 1451)  senna-docusate (Senokot-S) tablet 1 tablet (1 tablet Oral Given 07/26/23 1657)  HYDROmorphone  (DILAUDID ) injection 0.5 mg (0.5 mg Intravenous Given 07/26/23 1658)    Mobility walks     Focused Assessments     R Recommendations: See Admitting Provider Note  Report given to:   Additional Notes:

## 2023-07-27 DIAGNOSIS — I5032 Chronic diastolic (congestive) heart failure: Secondary | ICD-10-CM | POA: Diagnosis present

## 2023-07-27 DIAGNOSIS — I252 Old myocardial infarction: Secondary | ICD-10-CM | POA: Diagnosis not present

## 2023-07-27 DIAGNOSIS — Z79899 Other long term (current) drug therapy: Secondary | ICD-10-CM | POA: Diagnosis not present

## 2023-07-27 DIAGNOSIS — K5900 Constipation, unspecified: Secondary | ICD-10-CM | POA: Diagnosis present

## 2023-07-27 DIAGNOSIS — I110 Hypertensive heart disease with heart failure: Secondary | ICD-10-CM | POA: Diagnosis present

## 2023-07-27 DIAGNOSIS — R002 Palpitations: Secondary | ICD-10-CM | POA: Diagnosis not present

## 2023-07-27 DIAGNOSIS — F419 Anxiety disorder, unspecified: Secondary | ICD-10-CM | POA: Diagnosis present

## 2023-07-27 DIAGNOSIS — J4489 Other specified chronic obstructive pulmonary disease: Secondary | ICD-10-CM | POA: Diagnosis present

## 2023-07-27 DIAGNOSIS — K219 Gastro-esophageal reflux disease without esophagitis: Secondary | ICD-10-CM | POA: Diagnosis present

## 2023-07-27 DIAGNOSIS — Z8249 Family history of ischemic heart disease and other diseases of the circulatory system: Secondary | ICD-10-CM | POA: Diagnosis not present

## 2023-07-27 DIAGNOSIS — Z7982 Long term (current) use of aspirin: Secondary | ICD-10-CM | POA: Diagnosis not present

## 2023-07-27 DIAGNOSIS — I2510 Atherosclerotic heart disease of native coronary artery without angina pectoris: Secondary | ICD-10-CM | POA: Diagnosis present

## 2023-07-27 DIAGNOSIS — Z7984 Long term (current) use of oral hypoglycemic drugs: Secondary | ICD-10-CM | POA: Diagnosis not present

## 2023-07-27 DIAGNOSIS — G40909 Epilepsy, unspecified, not intractable, without status epilepticus: Secondary | ICD-10-CM | POA: Diagnosis present

## 2023-07-27 DIAGNOSIS — K5792 Diverticulitis of intestine, part unspecified, without perforation or abscess without bleeding: Secondary | ICD-10-CM | POA: Diagnosis present

## 2023-07-27 DIAGNOSIS — F319 Bipolar disorder, unspecified: Secondary | ICD-10-CM | POA: Diagnosis present

## 2023-07-27 DIAGNOSIS — M1990 Unspecified osteoarthritis, unspecified site: Secondary | ICD-10-CM | POA: Diagnosis present

## 2023-07-27 DIAGNOSIS — K5732 Diverticulitis of large intestine without perforation or abscess without bleeding: Secondary | ICD-10-CM | POA: Diagnosis present

## 2023-07-27 DIAGNOSIS — E119 Type 2 diabetes mellitus without complications: Secondary | ICD-10-CM | POA: Diagnosis present

## 2023-07-27 DIAGNOSIS — M4800 Spinal stenosis, site unspecified: Secondary | ICD-10-CM | POA: Diagnosis present

## 2023-07-27 DIAGNOSIS — B009 Herpesviral infection, unspecified: Secondary | ICD-10-CM | POA: Diagnosis present

## 2023-07-27 DIAGNOSIS — Z96642 Presence of left artificial hip joint: Secondary | ICD-10-CM | POA: Diagnosis present

## 2023-07-27 DIAGNOSIS — E785 Hyperlipidemia, unspecified: Secondary | ICD-10-CM | POA: Diagnosis present

## 2023-07-27 DIAGNOSIS — M797 Fibromyalgia: Secondary | ICD-10-CM | POA: Diagnosis present

## 2023-07-27 DIAGNOSIS — G4700 Insomnia, unspecified: Secondary | ICD-10-CM | POA: Diagnosis present

## 2023-07-27 DIAGNOSIS — Z96611 Presence of right artificial shoulder joint: Secondary | ICD-10-CM | POA: Diagnosis present

## 2023-07-27 LAB — HEMOGLOBIN A1C
Hgb A1c MFr Bld: 5.7 % — ABNORMAL HIGH (ref 4.8–5.6)
Mean Plasma Glucose: 116.89 mg/dL

## 2023-07-27 LAB — GLUCOSE, CAPILLARY
Glucose-Capillary: 75 mg/dL (ref 70–99)
Glucose-Capillary: 77 mg/dL (ref 70–99)
Glucose-Capillary: 71 mg/dL (ref 70–99)
Glucose-Capillary: 96 mg/dL (ref 70–99)
Glucose-Capillary: 86 mg/dL (ref 70–99)
Glucose-Capillary: 127 mg/dL — ABNORMAL HIGH (ref 70–99)

## 2023-07-27 LAB — BASIC METABOLIC PANEL WITH GFR
Anion gap: 11 (ref 5–15)
BUN: 14 mg/dL (ref 8–23)
CO2: 22 mmol/L (ref 22–32)
Calcium: 8.5 mg/dL — ABNORMAL LOW (ref 8.9–10.3)
Chloride: 105 mmol/L (ref 98–111)
Creatinine, Ser: 0.61 mg/dL (ref 0.44–1.00)
GFR, Estimated: >60 mL/min (ref >60)
Glucose, Bld: 76 mg/dL (ref 70–99)
Potassium: 3.5 mmol/L (ref 3.5–5.1)
Sodium: 138 mmol/L (ref 135–145)

## 2023-07-27 LAB — CBC
HCT: 39.3 % (ref 36.0–46.0)
Hemoglobin: 12.4 g/dL (ref 12.0–15.0)
MCH: 31.2 pg (ref 26.0–34.0)
MCHC: 31.6 g/dL (ref 30.0–36.0)
MCV: 99 fL (ref 80.0–100.0)
Platelets: 133 10*3/uL — ABNORMAL LOW (ref 150–400)
RBC: 3.97 MIL/uL (ref 3.87–5.11)
RDW: 12.3 % (ref 11.5–15.5)
WBC: 9.3 10*3/uL (ref 4.0–10.5)
nRBC: 0 % (ref 0.0–0.2)

## 2023-07-27 LAB — HIV ANTIBODY (ROUTINE TESTING W REFLEX): HIV Screen 4th Generation wRfx: NONREACTIVE

## 2023-07-27 MED ORDER — SENNOSIDES-DOCUSATE SODIUM 8.6-50 MG PO TABS
1.0000 | ORAL_TABLET | Freq: Two times a day (BID) | ORAL | Status: DC
  Administered 2023-07-27 – 2023-07-28 (×3): 1 via ORAL
  Filled 2023-07-27 (×3): qty 1

## 2023-07-27 MED ORDER — KCL IN DEXTROSE-NACL 20-5-0.45 MEQ/L-%-% IV SOLN: INTRAVENOUS | Status: AC

## 2023-07-27 MED ORDER — POLYETHYLENE GLYCOL 3350 17 G PO PACK
17.0000 g | PACK | Freq: Two times a day (BID) | ORAL | Status: DC
  Administered 2023-07-27 (×2): 17 g via ORAL
  Filled 2023-07-27 (×3): qty 1

## 2023-07-27 NOTE — TOC CM/SW Note (Signed)
 Transition of Care [facility]) - Inpatient Brief Assessment   Patient Details  Name: [name] MRN: [id number] Date of Birth: October 25, 1955  Transition of Care [facility]) CM/SW Contact:    Grandville Lax, LCSWA Phone Number: 07/27/2023, 9:52 AM   Clinical Narrative: Transition of Care Department [facility]) has reviewed patient and no TOC needs have been identified at this time. We will continue to monitor patient advancement through interdiciplinary progression rounds. If new patient transition needs arise, please place a TOC consult.   Transition of Care Asessment: Insurance and Status: Insurance coverage has been reviewed Patient has primary care physician: Yes Home environment has been reviewed: from home Prior level of function:: independent Prior/Current Home Services: No current home services Social Drivers of Health Review: SDOH reviewed no interventions necessary Readmission risk has been reviewed: Yes Transition of care needs: no transition of care needs at this time

## 2023-07-27 NOTE — Progress Notes (Signed)
 [facility] PROGRESS NOTE  [name] (DOB: 05-08-1955) UJW:[id number] PCP: Delfina Feller, FNP  Brief Narrative: [name] is a 68 y.o. female with a history of bipolar 1 disorder, chronic HFpEF, COPD, HTN, HLD, T2DM, seizure disorder, fibromyalgia, spinal stenosis and recurrent diverticulitis who presented to the ED on 07/26/2023 with abdominal pain, nausea and vomiting. She had been evaluated in the ED 4/25 where CT demonstrated stranding around sigmoid diverticulosis consistent with diverticulitis without evidence of abscess or perforation. She tolerated po challenge and was discharged home on oral antibiotics only to return having failed outpatient management. WBC up on admission to 12.8k, though patient hemodynamically stable with normal lactic acid. Repeat CT showed mild progression but similar findings and possible constipation.   Subjective: She's hungry, would like to advance diet, still feels pain but improved with medications.   Objective: BP 122/61   Pulse 83   Temp 99.1 F (37.3 C) (Oral)   Resp 17   Ht 5\' 2"  (1.575 m)   Wt 54.4 kg   SpO2 98%   BMI 21.95 kg/m   Gen: Chronically ill-appearing Pulm: Clear, nonlabored  CV: RRR, no MRG or edema GI: Soft, tender in LLQ without rebound, no distention or fluid wave, +BS Neuro: Alert and oriented. No new focal deficits. Ext: Warm, no deformities. Skin: No rashes, lesions or ulcers on visualized skin   Assessment & Plan: Acute recurrent uncomplicated sigmoid diverticulitis:  - Continue ceftriaxone /flagyl , leukocytosis resolved - Has not proven she would be able to tolerate po treatment at this time. Will continue IV analgesics, antiemetics, and IV fluids.  - Initiate liquid diet challenge this morning  Constipation: ?if stercoral colitis contributing to findings/symptoms.  - Avoid suppository/enema, but will start BID senokot, miralax . Abdominal exam is without peritonitis or obstruction.  Soft  BP, autonomic dysfunction:  - Continue home florinef , has been on midodrine  prn as well.   T2DM: HbA1c 5.7%  - SSI, hold SGLT2i for now  Chronic HFpEF: Was dry on exam, improving.  - Ok to continue maintenance IVF, hold SGLT2i and home lasix   COPD - Stable - Continue home bronchodilators   Bipolar disorder, anxiety, depression, insomnia: - continue SSRI, buspar , lamictal , trazodone   Seizure disorder:  - Continue lamictal , keppra   Chronic pain, fibromyalgia, OA, spinal stenosis:  - Continue home oral medications if she's able to tolerate them, also can give IV as appropriate     GERD - Continue PPI   HLD - Continue rosuvastatin     Wynetta Heckle, MD [facility] [email or web address] 07/27/2023, 1:02 PM

## 2023-07-27 NOTE — Progress Notes (Signed)
 CBG this morning was 71. Patient up and alert, oriented x4. Gave patient small cup of peaches, tolerating well. Will continue to monitor.

## 2023-07-28 DIAGNOSIS — R002 Palpitations: Secondary | ICD-10-CM | POA: Diagnosis not present

## 2023-07-28 DIAGNOSIS — K5792 Diverticulitis of intestine, part unspecified, without perforation or abscess without bleeding: Secondary | ICD-10-CM | POA: Diagnosis not present

## 2023-07-28 LAB — GLUCOSE, CAPILLARY
Glucose-Capillary: 72 mg/dL (ref 70–99)
Glucose-Capillary: 81 mg/dL (ref 70–99)
Glucose-Capillary: 85 mg/dL (ref 70–99)
Glucose-Capillary: 93 mg/dL (ref 70–99)

## 2023-07-28 MED ORDER — POLYETHYLENE GLYCOL 3350 17 G PO PACK: 17.0000 g | PACK | Freq: Two times a day (BID) | ORAL | 0 refills | Status: AC

## 2023-07-28 MED ORDER — CIPROFLOXACIN HCL 500 MG PO TABS: 500.0000 mg | ORAL_TABLET | Freq: Two times a day (BID) | ORAL | 0 refills | Status: AC

## 2023-07-28 MED ORDER — METRONIDAZOLE 500 MG PO TABS: 500.0000 mg | ORAL_TABLET | Freq: Two times a day (BID) | ORAL | 0 refills | Status: AC

## 2023-07-28 MED ORDER — SENNOSIDES-DOCUSATE SODIUM 8.6-50 MG PO TABS: 1.0000 | ORAL_TABLET | Freq: Two times a day (BID) | ORAL | 0 refills | Status: DC

## 2023-07-28 NOTE — Plan of Care (Signed)
  Problem: Clinical Measurements: Goal: Ability to maintain clinical measurements within normal limits will improve Outcome: Progressing Goal: Diagnostic test results will improve Outcome: Progressing Goal: Respiratory complications will improve Outcome: Progressing Goal: Cardiovascular complication will be avoided Outcome: Progressing   Problem: Fluid Volume: Goal: Ability to maintain a balanced intake and output will improve Outcome: Progressing   Problem: Health Behavior/Discharge Planning: Goal: Ability to identify and utilize available resources and services will improve Outcome: Progressing Goal: Ability to manage health-related needs will improve Outcome: Progressing   Problem: Metabolic: Goal: Ability to maintain appropriate glucose levels will improve Outcome: Progressing   Problem: Skin Integrity: Goal: Risk for impaired skin integrity will decrease Outcome: Progressing   Problem: Tissue Perfusion: Goal: Adequacy of tissue perfusion will improve Outcome: Progressing

## 2023-07-28 NOTE — Progress Notes (Signed)
 Went over discharge instructions w/ pt.

## 2023-07-28 NOTE — Discharge Summary (Signed)
 Physician Discharge Summary   Patient: [name] MRN: [id number] DOB: [birth year]  Admit date:     07/26/2023  Discharge date: 07/28/23  Discharge Physician: Wynetta Heckle   PCP: Delfina Feller, FNP   Recommendations at discharge:  Follow up with PCP in 1-2 weeks, consider GI evaluation in 6-8 weeks for colonoscopy. Maintain good bowel regimen.   Discharge Diagnoses: Principal Problem:   Diverticulitis Active Problems:   Intractable abdominal pain  Hospital Course: [name] is a 68 y.o. female with a history of bipolar 1 disorder, chronic HFpEF, COPD, HTN, HLD, T2DM, seizure disorder, fibromyalgia, spinal stenosis and recurrent diverticulitis who presented to the ED on 07/26/2023 with abdominal pain, nausea and vomiting. She had been evaluated in the ED 4/25 where CT demonstrated stranding around sigmoid diverticulosis consistent with diverticulitis without evidence of abscess or perforation. She tolerated po challenge and was discharged home on oral antibiotics only to return having failed outpatient management. WBC up on admission to 12.8k, though patient hemodynamically stable with normal lactic acid. Repeat CT showed mild progression but similar findings and possible constipation.   With treatments, her pain has improved, she's had BM, and tolerating an advancing diet.   Assessment and Plan: Acute recurrent uncomplicated sigmoid diverticulitis:  - Changed augmentin  to ceftriaxone /flagyl , leukocytosis resolved. Will discharge on cipro /flagyl . - Tolerating po.   - GI follow up recommended - Bowel regimen prescribed as well as below.   Constipation: ?if stercoral colitis contributing to findings/symptoms.  - Avoid suppository/enema, but will recommend continuing BID senokot, miralax . Abdominal exam is without peritonitis or obstruction.   Soft BP, autonomic dysfunction: No changes   T2DM: HbA1c 5.7% No changes   Chronic HFpEF: No changes   COPD - Stable -  Continue home bronchodilators   Bipolar disorder, anxiety, depression, insomnia: - Continue SSRI, buspar , lamictal , trazodone    Seizure disorder:  - Continue lamictal , keppra    Chronic pain, fibromyalgia, OA, spinal stenosis:  - Continue home oral medications including Hydrocodone -Acetamin 7.5-325mg  prescribed #90 by PCP on 3/18, filled 4/21   GERD - Continue PPI   HLD - Continue rosuvastatin   Consultants: None Procedures performed: None  Disposition: Home Diet recommendation: As tolerated DISCHARGE MEDICATION: Allergies as of 07/28/2023       Reactions   Codeine  Other (See Comments)   "I will have a heart attack."   Morphine And Codeine  Other (See Comments)   "It will cause me to have a heart attack."   Ambien  [zolpidem  Tartrate] Nausea And Vomiting   Clonidine  Derivatives Nausea And Vomiting   GERD - caused acid reflux per pt   Metformin  And Related Nausea And Vomiting   Cramping from metformin    Lyrica  [pregabalin ] Swelling, Other (See Comments)   Weight gain   Neurontin [gabapentin] Other (See Comments)   Causes elevated LFTs        Medication List     STOP taking these medications    amoxicillin -clavulanate 875-125 MG tablet Commonly known as: AUGMENTIN        TAKE these medications    Accu-Chek Guide test strip Generic drug: glucose blood TEST BLOOD SUGAR UP TO 4 TIMES A DAY AS DIRECTED. R73.03   Accu-Chek Softclix Lancets lancets test blood sugar up to 4 times a day as directed. Dx R73.03   acyclovir  ointment 5 % Commonly known as: ZOVIRAX  apply 1 application topically every 3 (three) hours as needed (outbreaks).   albuterol  108 (90 Base) MCG/ACT inhaler Commonly known as: VENTOLIN  HFA inhale 2 puffs every  6 hours as needed for shortness of breath and wheezing.   B-D SINGLE USE SWABS  REGULAR Pads USE 1 PAD DAILY WHEN CHECKING BLOOD SUGAR. R73.03   blood glucose meter kit and supplies Dispense based on patient and insurance preference. Use  up to four times daily as directed. (FOR ICD-10 E10.9, E11.9).   Breo Ellipta  100-25 MCG/ACT Aepb Generic drug: fluticasone  furoate-vilanterol Inhale 1 puff by mouth once daily   busPIRone  10 MG tablet Commonly known as: BUSPAR  Take 1 tablet (10 mg total) by mouth 2 (two) times daily as needed.   butalbital -acetaminophen -caffeine  50-325-40 MG tablet Commonly known as: FIORICET  take 1 tablet by mouth every 6 (six) hours as needed for headache.   carvedilol  3.125 MG tablet Commonly known as: COREG  TAKE 1/2 (0.5) TABLET TWICE A DAY WITH A MEAL; BLOOD PRESSURE OVER 150 BEFORE TAKING.   celecoxib  200 MG capsule Commonly known as: CELEBREX  Take 1 capsule (200 mg total) by mouth daily as needed.   ciprofloxacin  500 MG tablet Commonly known as: Cipro  Take 1 tablet (500 mg total) by mouth 2 (two) times daily for 3 days.   Coricidin HBP Cold/Cough/Flu 20-400-650 MG/30ML Liqd Generic drug: Dextromethorphan-GG-APAP Take 30 mLs by mouth every 4 (four) hours as needed (cough).   dexlansoprazole  60 MG capsule Commonly known as: DEXILANT  Take 1 capsule (60 mg total) by mouth daily.   diclofenac  Sodium 1 % Gel Commonly known as: VOLTAREN  APPLY 4 GRAMS TOPICALLY 4 TIMES A DAY. What changed: See the new instructions.   DULoxetine  60 MG capsule Commonly known as: CYMBALTA  Take 1 capsule (60 mg total) by mouth at bedtime.   empagliflozin  25 MG Tabs tablet Commonly known as: Jardiance  Take 1 tablet (25 mg total) by mouth daily.   escitalopram  20 MG tablet Commonly known as: LEXAPRO  Take 1 tablet (20 mg total) by mouth daily.   estradiol  0.1 MG/GM vaginal cream Commonly known as: ESTRACE  Place 1 Applicatorful vaginally 3 (three) times a week.   FIBER PO Take 1 tablet by mouth in the morning and at bedtime.   fluconazole  150 MG tablet Commonly known as: Diflucan  1 po q week x 4 weeks   fludrocortisone  0.1 MG tablet Commonly known as: FLORINEF  take 1 tablet (100mcg total) by  mouth every other day.   fluticasone  50 MCG/ACT nasal spray Commonly known as: FLONASE  USE 1 SPRAY IN EACH NOSTRIL ONCE DAILY. What changed:  when to take this reasons to take this   furosemide  40 MG tablet Commonly known as: LASIX  TAKE 1 TABLET DAILY AS NEEDED FOR EXCESSIVE FLUID.   Glucagon  Emergency 1 MG Kit Inject 1 mg into the skin as needed.   hydrALAZINE  25 MG tablet Commonly known as: APRESOLINE  take 1 tablet by mouth 3 times daily as needed (for severe hypertension/systolic number 170 or greater).   HYDROcodone -acetaminophen  7.5-325 MG tablet Commonly known as: NORCO Take 1 tablet by mouth in the morning, at noon, and at bedtime.   HYDROcodone -acetaminophen  7.5-325 MG tablet Commonly known as: NORCO Take 1 tablet by mouth in the morning, at noon, and at bedtime. Start taking on: Aug 14, 2023   ipratropium 0.02 % nebulizer solution Commonly known as: ATROVENT  USE 1 VIAL ( ) IN NEBULIZER EVERY 6 HOURS AS NEEDED FOR WHEEZING OR SHORTNESS OF BREATH.   isosorbide  mononitrate 60 MG 24 hr tablet Commonly known as: IMDUR  Take 1 tablet (60 mg total) by mouth 2 (two) times daily.   lamoTRIgine  150 MG tablet Commonly known as: LAMICTAL  Take 1 tablet (  150 mg total) by mouth daily.   levETIRAcetam  500 MG tablet Commonly known as: KEPPRA  Take 1 tablet (500 mg total) by mouth 2 (two) times daily.   levocetirizine 5 MG tablet Commonly known as: XYZAL  take 1 tablet by mouth every morning.   lidocaine  5 % ointment Commonly known as: XYLOCAINE  apply to affected area 3 times a day as needed for mild or moderate pain.   lidocaine  5 % Commonly known as: LIDODERM  place 1 patch onto the skin daily. remove & discard patch within 12 hours or as directed by md   methocarbamol  500 MG tablet Commonly known as: ROBAXIN  take 1 tablet (500 milligram total) by mouth 4 (four) times daily.   metoCLOPramide  10 MG tablet Commonly known as: REGLAN  Take 1 tablet (10 mg total) by  mouth every 6 (six) hours.   metroNIDAZOLE  500 MG tablet Commonly known as: Flagyl  Take 1 tablet (500 mg total) by mouth 2 (two) times daily for 3 days.   naproxen  500 MG tablet Commonly known as: NAPROSYN  Take 1 tablet (500 mg total) by mouth 2 (two) times daily.   nitroGLYCERIN  0.4 MG SL tablet Commonly known as: NITROSTAT  place one (1) tablet under tongue every 5 minutes up to (3) doses as needed for chest pain.   ondansetron  4 MG tablet Commonly known as: ZOFRAN  take 1 tablet by mouth every 8 hours as needed for nausea and vomiting.   oxybutynin  5 MG 24 hr tablet Commonly known as: DITROPAN -XL Take 1 tablet (5 mg total) by mouth at bedtime.   polyethylene glycol 17 g packet Commonly known as: MIRALAX  / GLYCOLAX  Take 17 g by mouth 2 (two) times daily.   PROBIOTIC PO Take 1 capsule by mouth in the morning.   promethazine -dextromethorphan 6.25-15 MG/5ML syrup Commonly known as: PROMETHAZINE -DM Take 5 mLs by mouth 3 (three) times daily as needed for cough.   rosuvastatin  10 MG tablet Commonly known as: CRESTOR  Take 1 tablet (10 mg total) by mouth daily.   senna-docusate 8.6-50 MG tablet Commonly known as: Senokot-S Take 1 tablet by mouth 2 (two) times daily.   traZODone  150 MG tablet Commonly known as: DESYREL  Take 1 tablet (150 mg total) by mouth at bedtime.   valACYclovir  500 MG tablet Commonly known as: VALTREX  TAKE (1) TABLET TWICE DAILY.   valACYclovir  1000 MG tablet Commonly known as: VALTREX  Take 1 tablet (1,000 mg total) by mouth 3 (three) times daily as needed (with active outbreaks.).        Follow-up Information     Delfina Feller, FNP Follow up.   Specialty: Family Medicine Contact information: [address] [place] [address] [phone]                Discharge Exam: [name] Weights   07/26/23 1141  Weight: 54.4 kg  BP (!) 110/59 (BP Location: Right Arm)   Pulse 64   Temp 98.2 F (36.8 C) (Oral)   Resp 18    Ht 5\' 2"  (1.575 m)   Wt 54.4 kg   SpO2 95%   BMI 21.95 kg/m   Nontoxic female in no distress Clear, nonlabored RRR, no MRG Soft, minimally tender in LLQ without rebound or guarding, +BS  Condition at discharge: stable  The results of significant diagnostics from this hospitalization (including imaging, microbiology, ancillary and laboratory) are listed below for reference.   Imaging Studies: CT ABDOMEN PELVIS W CONTRAST Result Date: 07/26/2023 CLINICAL DATA:  Left lower quadrant pain and emesis. EXAM: CT ABDOMEN AND  PELVIS WITH CONTRAST TECHNIQUE: Multidetector CT imaging of the abdomen and pelvis was performed using the standard protocol following bolus administration of intravenous contrast. RADIATION DOSE REDUCTION: This exam was performed according to the departmental dose-optimization program which includes automated exposure control, adjustment of the mA and/or kV according to patient size and/or use of iterative reconstruction technique. CONTRAST:  OMNIPAQUE  IOHEXOL  300 MG/ML  SOLN COMPARISON:  07/24/2023 FINDINGS: Lower chest: Bibasilar scarring. Normal heart size without pericardial or pleural effusion. Tiny hiatal hernia. Hepatobiliary: Normal liver. Cholecystectomy. Borderline intrahepatic duct dilatation. Common duct 11 mm on 34/2, similar. Pancreas: Normal, without mass or ductal dilatation. Spleen: Normal in size, without focal abnormality. Adrenals/Urinary Tract: Normal adrenal glands. Bilateral renal sinus cysts. Too small to characterize upper pole left renal lesion is most likely a cyst . In the absence of clinically indicated signs/symptoms require(s) no independent follow-up. no hydronephrosis. Degraded evaluation of the pelvis, secondary to beam hardening artifact from left hip arthroplasty. Grossly normal urinary bladder. Stomach/Bowel: Apparent gastric antral wall thickening is likely due to underdistention; absent 2 days ago. Extensive colonic diverticulosis. Sigmoid  wall thickening again identified. Mild pericolonic edema adjacent the sigmoid is increased, including on 71/2. No free perforation or drainable abscess. Colonic stool burden suggests constipation. Normal terminal ileum and appendix.  Normal small bowel. Vascular/Lymphatic: Aortic atherosclerosis. No abdominopelvic adenopathy. Reproductive: Grossly normal uterus, without adnexal mass. Other: Trace pelvic fluid, increased.  No abdominal ascites. Musculoskeletal: Left hip arthroplasty. Incompletely imaged right shoulder arthroplasty. L4-5 trans pedicle screw fixation. Moderate T12 compression deformity with mild ventral canal encroachment, similar. IMPRESSION: 1. Noncomplicated sigmoid diverticulitis, minimally progressive. 2. Status post cholecystectomy with mild intra and extrahepatic biliary duct dilatation. Correlate with bilirubin levels. Consider nonemergent MRCP or ERCP if elevated. 3.  Possible constipation. 4.  Aortic Atherosclerosis (ICD10-I70.0). 5.  Tiny hiatal hernia. Electronically Signed   By: Lore Rode M.D.   On: 07/26/2023 16:38   CT ABDOMEN PELVIS W CONTRAST Result Date: 07/24/2023 CLINICAL DATA:  Lower abdominal pain. History of sigmoid diverticulitis. EXAM: CT ABDOMEN AND PELVIS WITH CONTRAST TECHNIQUE: Multidetector CT imaging of the abdomen and pelvis was performed using the standard protocol following bolus administration of intravenous contrast. RADIATION DOSE REDUCTION: This exam was performed according to the departmental dose-optimization program which includes automated exposure control, adjustment of the mA and/or kV according to patient size and/or use of iterative reconstruction technique. CONTRAST:  OMNIPAQUE  IOHEXOL  300 MG/ML  SOLN COMPARISON:  CT abdomen pelvis dated 08/16/2022. FINDINGS: Lower chest: Bibasilar linear atelectasis/scarring. No intra-abdominal free air or free fluid. Hepatobiliary: The liver is unremarkable. There is mild biliary dilatation, post  cholecystectomy. No retained calcified stone noted in the central CBD. Pancreas: Unremarkable. No pancreatic ductal dilatation or surrounding inflammatory changes. Spleen: Normal in size without focal abnormality. Adrenals/Urinary Tract: The adrenal glands are unremarkable. The kidneys, visualized ureters, and urinary bladder appear unremarkable. Stomach/Bowel: There is moderate stool throughout the colon. There is sigmoid diverticulosis with muscular hypertrophy. Diffuse perisigmoid stranding and edema may represent diverticulitis or mild stercoral colitis. No diverticular abscess or perforation. There is no bowel obstruction. There is a small hiatal hernia. The appendix is normal. Vascular/Lymphatic: Mild atherosclerotic calcification of the abdominal aorta. The IVC is unremarkable. No portal venous gas. There is no adenopathy. Reproductive: The uterus is poorly visualized. No suspicious adnexal masses. Other: None Musculoskeletal: Osteopenia with degenerative changes of the spine. Lower lumbar posterior fusion. Total left hip arthroplasty. No acute osseous pathology. Chronic T12 compression fracture IMPRESSION: 1.  Sigmoid diverticulosis with muscular hypertrophy. Diffuse perisigmoid stranding and edema may represent diverticulitis or mild stercoral colitis. No diverticular abscess or perforation. 2. No bowel obstruction. Normal appendix. 3.  Aortic Atherosclerosis (ICD10-I70.0). Electronically Signed   By: Angus Bark M.D.   On: 07/24/2023 11:36    Microbiology: Results for orders placed or performed during the hospital encounter of 07/26/23  Culture, blood (routine x 2)     Status: None (Preliminary result)   Collection Time: 07/26/23 12:30 PM   Specimen: BLOOD RIGHT HAND  Result Value Ref Range Status   Specimen Description   Final    BLOOD RIGHT HAND BOTTLES DRAWN AEROBIC AND ANAEROBIC   Special Requests Blood Culture adequate volume  Final   Culture   Final    NO GROWTH 2 DAYS Performed at  [facility], [address] [place], [place] [address]    Report Status PENDING  Incomplete  Culture, blood (routine x 2)     Status: None (Preliminary result)   Collection Time: 07/26/23 12:30 PM   Specimen: BLOOD RIGHT HAND  Result Value Ref Range Status   Specimen Description   Final    BLOOD RIGHT HAND BOTTLES DRAWN AEROBIC AND ANAEROBIC   Special Requests Blood Culture adequate volume  Final   Culture   Final    NO GROWTH 2 DAYS Performed at [facility], [address], [place], [place] [address]    Report Status PENDING  Incomplete   *Note: Due to a large number of results and/or encounters for the requested time period, some results have not been displayed. A complete set of results can be found in Results Review.    Labs: CBC: Recent Labs  Lab 07/24/23 1013 07/26/23 1230 07/27/23 0447  WBC 10.5 12.8* 9.3  NEUTROABS 7.8* 10.0*  --   HGB 14.9 14.1 12.4  HCT 46.1* 44.1 39.3  MCV 97.7 97.8 99.0  PLT 160 169 133*   Basic Metabolic Panel: Recent Labs  Lab 07/24/23 1013 07/26/23 1230 07/27/23 0447  NA 137 135 138  K 3.7 3.6 3.5  CL 99 97* 105  CO2 26 24 22   GLUCOSE 115* 97 76  BUN 13 18 14   CREATININE 0.89 0.84 0.61  CALCIUM  9.3 9.4 8.5*   Liver Function Tests: Recent Labs  Lab 07/24/23 1013 07/26/23 1230  AST 20 15  ALT 16 11  ALKPHOS 97 95  BILITOT 0.6 1.0  PROT 7.4 7.5  ALBUMIN  3.9 3.5   CBG: Recent Labs  Lab 07/27/23 2022 07/27/23 2359 07/28/23 0412 07/28/23 0734 07/28/23 1110  GLUCAP 127* 93 85 81 72    Discharge time spent: greater than 30 minutes.  Signed: Wynetta Heckle, MD [facility] 07/28/2023

## 2023-07-29 ENCOUNTER — Telehealth: Payer: Self-pay | Admitting: *Deleted

## 2023-07-29 NOTE — Transitions of Care (Post Inpatient/ED Visit) (Signed)
   07/29/2023  Name: [name] MRN: [id number] DOB: Oct 05, 1955  Today's TOC FU Call Status: Today's TOC FU Call Status:: Unsuccessful Call (1st Attempt) Unsuccessful Call (1st Attempt) Date: 07/29/23  Attempted to reach the patient regarding the most recent Inpatient/ED visit.  Follow Up Plan: Additional outreach attempts will be made to reach the patient to complete the Transitions of Care (Post Inpatient/ED visit) call.   Cecilie Coffee [facility], BSN RN Care Manager/ Transition of Care [place]/ [facility] [phone]

## 2023-07-30 ENCOUNTER — Other Ambulatory Visit: Payer: Self-pay | Admitting: Nurse Practitioner

## 2023-07-30 ENCOUNTER — Telehealth: Payer: Self-pay | Admitting: *Deleted

## 2023-07-30 NOTE — Transitions of Care (Post Inpatient/ED Visit) (Signed)
   07/30/2023  Name: [name] MRN: [id number] DOB: 1955/05/01  Today's TOC FU Call Status: Today's TOC FU Call Status:: Unsuccessful Call (2nd Attempt) Unsuccessful Call (2nd Attempt) Date: 07/30/23  Attempted to reach the patient regarding the most recent Inpatient/ED visit.  Follow Up Plan: Additional outreach attempts will be made to reach the patient to complete the Transitions of Care (Post Inpatient/ED visit) call.   Cecilie Coffee [facility], BSN RN Care Manager/ Transition of Care [place]/ [facility] [phone]

## 2023-07-31 ENCOUNTER — Telehealth: Payer: Self-pay | Admitting: *Deleted

## 2023-07-31 LAB — CULTURE, BLOOD (ROUTINE X 2)
Culture: NO GROWTH
Culture: NO GROWTH
Special Requests: ADEQUATE
Special Requests: ADEQUATE

## 2023-07-31 NOTE — Transitions of Care (Post Inpatient/ED Visit) (Signed)
   07/31/2023  Name: [name] MRN: [id number] DOB: 31-Oct-1955  Today's TOC FU Call Status: Today's TOC FU Call Status:: Unsuccessful Call (3rd Attempt) Unsuccessful Call (3rd Attempt) Date: 07/31/23  Attempted to reach the patient regarding the most recent Inpatient/ED visit.  Follow Up Plan: No further outreach attempts will be made at this time. We have been unable to contact the patient.  Cecilie Coffee [facility], BSN RN Care Manager/ Transition of Care [place]/ [facility] [phone]

## 2023-08-17 DIAGNOSIS — R002 Palpitations: Secondary | ICD-10-CM | POA: Diagnosis not present

## 2023-08-21 ENCOUNTER — Other Ambulatory Visit: Payer: Self-pay | Admitting: Nurse Practitioner

## 2023-08-21 DIAGNOSIS — B009 Herpesviral infection, unspecified: Secondary | ICD-10-CM

## 2023-08-21 DIAGNOSIS — M79644 Pain in right finger(s): Secondary | ICD-10-CM

## 2023-08-25 ENCOUNTER — Ambulatory Visit: Admitting: Sports Medicine

## 2023-08-26 ENCOUNTER — Other Ambulatory Visit: Payer: Self-pay | Admitting: Nurse Practitioner

## 2023-08-27 ENCOUNTER — Encounter: Payer: Self-pay | Admitting: Nurse Practitioner

## 2023-08-27 ENCOUNTER — Ambulatory Visit: Attending: Nurse Practitioner | Admitting: Nurse Practitioner

## 2023-08-27 ENCOUNTER — Other Ambulatory Visit: Payer: Self-pay | Admitting: Nurse Practitioner

## 2023-08-27 VITALS — BP 106/72 | HR 74 | Ht 62.0 in | Wt 120.0 lb

## 2023-08-27 DIAGNOSIS — E785 Hyperlipidemia, unspecified: Secondary | ICD-10-CM

## 2023-08-27 DIAGNOSIS — G909 Disorder of the autonomic nervous system, unspecified: Secondary | ICD-10-CM | POA: Diagnosis not present

## 2023-08-27 DIAGNOSIS — Z87898 Personal history of other specified conditions: Secondary | ICD-10-CM

## 2023-08-27 DIAGNOSIS — R002 Palpitations: Secondary | ICD-10-CM | POA: Diagnosis not present

## 2023-08-27 DIAGNOSIS — I10 Essential (primary) hypertension: Secondary | ICD-10-CM | POA: Diagnosis not present

## 2023-08-27 LAB — HM DIABETES EYE EXAM

## 2023-08-27 NOTE — Progress Notes (Signed)
 Cardiology Office Note:  .   Date:  08/27/2023 ID:  [name], DOB March 14, 1956, MRN [id number] PCP: Delfina Feller, FNP  [facility] Providers Cardiologist:  Armida Lander, MD    History of Present Illness: .   [name] is a 68 y.o. female with a past medical history of recurrent syncope, autonomic dysfunction, hypertension, chest pain, hyperlipidemia, history of COVID-19 in December 2024, asthma, bipolar 1 disorder, COPD, who presents today for chest pain follow-up.   Last seen by Dr. Armida Lander on April 21, 2023.  Overall was doing well.  It was noted she had chronic stable and infrequent symptoms of chest pain.  Past stress test were negative.  NST in 2019 is negative.  Cardiac catheterization 2013 showed patent vessels.  Symptoms did improve in the past with Imdur .  05/29/2023 - Today she presents for chest pain evaluation.  She states her chest pain started this past Sunday and has been getting worse recently.  She admits to constant chest pain/pressure on left lower side of her chest, underneath her left breast tissue.  It is made worse through exertion.  At rest, her pain is rated at a 4-5 on 0-10 pain scale, 7 out of 10 currently.  She says she is even noticing this while talking to me during her interview.  She says she believes something is out of the ordinary as this feels different from her previous episodes of chest pain.  The only time she does not have chest pain is when she is sleeping at night.  Denies any aggravating factors or worsening symptoms through position changes.  She says nitroglycerin  helps. Denies any shortness of breath, palpitations, syncope, presyncope, dizziness, orthopnea, PND, swelling or significant weight changes, acute bleeding, or claudication.  Denies any recent illness or sick contacts. She was sent to ED for further evaluation.   ED visit on 05/29/2023 for chest pain.  Workup was overall unremarkable.  Was recommended to  follow-up with PCP for further evaluation and outpatient cardiology.  06/02/2023 -today she presents for follow-up.  Continues to admit to similar chest pain from last office visit.  She denies any improvement in her symptoms.  Currently rates her chest pain as a 5/10 on the 0-10 pain scale.  Denies any progressive/worsening symptoms.  Wants to know she needs an echocardiogram performed. Denies any shortness of breath, syncope, presyncope, dizziness, orthopnea, PND, swelling or significant weight changes, acute bleeding, or claudication.  Does admit to some daily palpitations, says this is brief in duration and not her most bothersome symptom.  Denies any recent sick contacts, malaise, fever, chills.   Since I have last seen patient she visited the ED at [facility] on June 05, 2023. She had a mechanical fall and landed on left side of ribs. Reported pain with deep inspiration, worse with movement.  X-ray was negative for anything acute.  ED visit on July 24, 2023 due to diverticulitis, experienced lower abdominal pain.  Described as constant and burning.  Has tried hydrocodone  and muscle relaxer, did not provide relief.  CT of abdomen and pelvis showed sigmoid diverticulosis with muscular hypertrophy and diffuse perisigmoid stranding and edema that was likely to represent diverticulitis or mild stercoral colitis, no bowel obstruction or acute finding.  Aortic atherosclerosis also noted.  Hospitalized shortly after ED visit with acute diverticulitis after failing outpatient management.  Repeat CT scan showed mild progression but similar findings and possible constipation.  GI follow-up was recommended.  Today she presents  for outpatient follow-up.  She states she is doing well. Denies any chest pain, shortness of breath, syncope, presyncope, dizziness, orthopnea, PND, swelling or significant weight changes, acute bleeding, or claudication. Does admit to some palpitations at times.   ROS: Negative.  See  HPI.  Studies Reviewed: Aaron Aas    EKG: EKG is not ordered today.  Cardiac monitor 07/2023:    12 day monitor   Rare supraventricular ectopy in the form of isolated PACs, couplets, triplets. 15 runs of SVT, longest 10 beats.   Occasional ventricular ectopy in the form of isolated PVCs (3.4%). Rare couplets, triplets   Reported symptoms correlated with sinus rhythm, rare PACs.     Patch Wear Time:  11 days and 14 hours (2025-04-13T07:10:17-0400 to 2025-04-24T21:23:10-398)   Patient had a min HR of 49 bpm, max HR of 156 bpm, and avg HR of 80 bpm. Predominant underlying rhythm was Sinus Rhythm. 15 Supraventricular Tachycardia runs occurred, the run with the fastest interval lasting 4 beats with a max rate of 156 bpm, the  longest lasting 10 beats with an avg rate of 111 bpm. Isolated SVEs were rare (<1.0%), SVE Couplets were rare (<1.0%), and SVE Triplets were rare (<1.0%). Isolated VEs were occasional (3.4%, [id number]), VE Couplets were rare (<1.0%, 161), and VE Triplets  were rare (<1.0%, 1). Ventricular Bigeminy and Trigeminy were present.   Echo 05/2023:   1. Left ventricular ejection fraction, by estimation, is 60 to 65%. Left  ventricular ejection fraction by 3D volume is 62 %. The left ventricle has  normal function. The left ventricle has no regional wall motion  abnormalities. Left ventricular diastolic   parameters are consistent with Grade I diastolic dysfunction (impaired  relaxation). The average left ventricular global longitudinal strain is  -19.8 %. The global longitudinal strain is normal.   2. Right ventricular systolic function is normal. The right ventricular  size is normal. Tricuspid regurgitation signal is inadequate for assessing  PA pressure.   3. The mitral valve is normal in structure. Trivial mitral valve  regurgitation. No evidence of mitral stenosis.   4. The aortic valve is tricuspid. Aortic valve regurgitation is mild. No  aortic stenosis is present. Aortic valve  mean gradient measures 4.0 mmHg.   5. The inferior vena cava is normal in size with greater than 50%  respiratory variability, suggesting right atrial pressure of 3 mmHg.   Comparison(s): A prior study was performed on 11/26/2020. No significant change from prior study. EF 60-65%. Borderline LVH. Mild mitral and aortic regurgitation.  Lexiscan  05/2023:    Stress ECG is negative for ischemia and arrhythmias.   LV perfusion is normal. There is no evidence of ischemia. There is no evidence of infarction.   Left ventricular function is normal. Nuclear stress EF: 74%. The left ventricular ejection fraction is hyperdynamic (>65%). End diastolic cavity size is normal. End systolic cavity size is normal.   Findings are consistent with no ischemia and no infarction. The study is low risk.  Echocardiogram 10/2020: 1. Left ventricular ejection fraction, by estimation, is 60 to 65%. The  left ventricle has normal function. The left ventricle has no regional  wall motion abnormalities. Left ventricular diastolic parameters are  indeterminate.   2. Right ventricular systolic function is normal. The right ventricular  size is normal. Tricuspid regurgitation signal is inadequate for assessing  PA pressure.   3. The mitral valve is grossly normal. Mild mitral valve regurgitation.   4. The aortic valve is tricuspid. There is  mild calcification of the  aortic valve. Aortic valve regurgitation is mild. No aortic stenosis is  present.   5. The inferior vena cava is normal in size with greater than 50%  respiratory variability, suggesting right atrial pressure of 3 mmHg.   Comparison(s): Prior images reviewed side by side. Aortic regurgitation is overall mild.   Lexiscan  09/2017: There was no ST segment deviation noted during stress. Nonspecific T wave flattening in aVL and V2 seen throughout study. Defect 1: There is a medium defect of mild severity present in the mid inferior, apical inferior and apical  lateral location. This appears to be due to soft tissue attenuation given normal regional wall motion. No ischemic territories. This is a low risk study. Nuclear stress EF: 65%.  Physical Exam:   VS:  BP 106/72   Pulse 74   Ht 5\' 2"  (1.575 m)   Wt 120 lb (54.4 kg)   SpO2 98%   BMI 21.95 kg/m    Wt Readings from Last 3 Encounters:  08/27/23 120 lb (54.4 kg)  07/26/23 120 lb (54.4 kg)  07/24/23 120 lb (54.4 kg)    GEN: Well nourished, well developed 68 year old female NECK: No JVD; No carotid bruits CARDIAC: S1/S2, RRR, no murmurs, rubs, gallops RESPIRATORY:  Clear to auscultation without rales, wheezing or rhonchi  ABDOMEN: Soft, non-tender, non-distended EXTREMITIES:  No edema; No deformity   ASSESSMENT AND PLAN: .       1. Hx of recurrent syncope/Autonomic dysfunction/ palpitations Does admit to some palpitations at times.  Denies any recent symptoms.  Continue current medication regimen.  See monitor report noted above.  Did offer/suggest to adjust her medicine and she declined at this time.  No medication changes at this time.  Care knee precautions discussed.  2. HTN Blood pressure is stable.  No medication changes at this time.  Discussed to monitor BP at home at least 2 hours after medications and sitting for 5-10 minutes.  3. HLD LDL 64 in September 24.  Continue rosuvastatin .   Dispo: Care and ED precautions discussed.  Follow-up with MD/APP in 6 months.  Signed, Lasalle Pointer, NP

## 2023-08-27 NOTE — Patient Instructions (Addendum)

## 2023-09-01 ENCOUNTER — Other Ambulatory Visit: Payer: Self-pay | Admitting: Family Medicine

## 2023-09-02 ENCOUNTER — Ambulatory Visit: Payer: Self-pay | Admitting: Nurse Practitioner

## 2023-09-08 ENCOUNTER — Ambulatory Visit: Admitting: Sports Medicine

## 2023-09-10 ENCOUNTER — Ambulatory Visit: Admitting: Sports Medicine

## 2023-09-10 ENCOUNTER — Encounter: Payer: Self-pay | Admitting: Sports Medicine

## 2023-09-10 DIAGNOSIS — M79645 Pain in left finger(s): Secondary | ICD-10-CM

## 2023-09-10 DIAGNOSIS — M1811 Unilateral primary osteoarthritis of first carpometacarpal joint, right hand: Secondary | ICD-10-CM

## 2023-09-10 DIAGNOSIS — M79644 Pain in right finger(s): Secondary | ICD-10-CM | POA: Diagnosis not present

## 2023-09-10 DIAGNOSIS — M1812 Unilateral primary osteoarthritis of first carpometacarpal joint, left hand: Secondary | ICD-10-CM | POA: Diagnosis not present

## 2023-09-10 MED ORDER — METHYLPREDNISOLONE ACETATE 40 MG/ML IJ SUSP
40.0000 mg | INTRAMUSCULAR | Status: AC | PRN
  Administered 2023-09-10: 40 mg via INTRA_ARTICULAR

## 2023-09-10 MED ORDER — BETAMETHASONE SOD PHOS & ACET 6 (3-3) MG/ML IJ SUSP: 6.0000 mg | INTRAMUSCULAR | Status: DC | PRN

## 2023-09-10 MED ORDER — CELECOXIB 200 MG PO CAPS
200.0000 mg | ORAL_CAPSULE | Freq: Every day | ORAL | 1 refills | Status: DC
Start: 2023-09-10 — End: 2023-12-08

## 2023-09-10 MED ORDER — LIDOCAINE HCL 1 % IJ SOLN
0.5000 mL | INTRAMUSCULAR | Status: AC | PRN
Start: 2023-09-10 — End: 2023-09-10
  Administered 2023-09-10: .5 mL

## 2023-09-10 NOTE — Progress Notes (Signed)
 Patient says that both thumbs are bothering her again, but the left is much worse than the right. She got about 1 month of relief from the injection for the left thumb, and since then her pain has returned and gotten worse than it was prior to the injection. She is also having swelling on the left thumb, which is new for her. Patient got about 2.5 months of relief from the injection for the right thumb. She says it has gotten worse in the last couple of weeks. She denies any swelling on the right thumb. She is able to hold a book with the right hand, but can no longer hold a book with the left hand due to pain. She is inquiring about repeating injections today, and talking about surgery.

## 2023-09-10 NOTE — Progress Notes (Addendum)
 [name] - 68 y.o. female MRN [id number]  Date of birth: 1955/05/25  Office Visit Note: Visit Date: 09/10/2023 PCP: Erica Feller, FNP Referred by: [name], [name], *  Subjective: Chief Complaint  Patient presents with   Left Hand - Pain   Right Hand - Pain   HPI: [name] is a pleasant 68 y.o. female who presents today for acute on chronic bilateral thumb pain with advanced CMC arthritis.  Back on 05/28/23 we did perform bilateral CMC joint injections - the left knee is always the worst and only got relief for about 1 month where the right thumb got about 2-1/2 almost 3 months of relief.  She is having newer onset swelling in the left thumb.  She is unable to perform simple tasks such as holding a book or gripping activities with the left thumb because of the pain and discomfort.  At this point, she is interested in considering a surgical intervention.  Pertinent ROS were reviewed with the patient and found to be negative unless otherwise specified above in HPI.   Assessment & Plan: Visit Diagnoses:  1. Primary osteoarthritis of first carpometacarpal joint of left hand   2. Bilateral thumb pain   3. Primary osteoarthritis of first carpometacarpal joint of right hand    Plan: Impression is exacerbation of chronic bilateral thumb pain with left thumb severe CMC joint osteoarthritis and right thumb moderate CMC osteoarthritis.  Her left thumb is only receiving temporary relief from injections and bracing.  At this point, she is interested in [facility] joint arthroplasty, I will send her to Dr. Merlinda [name] for evaluation of this with likely surgical intervention.  The right thumb is not as severe and still getting more prolonged benefit from injections, through shared decision making we did repeat with right [facility] joint injection.  Advised on postinjection protocol.  She may use ice as well as her Celebrex  200 mg daily for her thumb and overall joint pain.  Will continue in her  CMC cool comfort bracing as well.  She will see Dr. Merlinda [name] for the left thumb, I am happy to see her back then as needed for the right thumb or any other issues that arise.  Follow-up: Return for Make appt with Dr. Merlinda [name] to discuss L-thumb [facility] arthroplasty.   Meds & Orders:  Meds ordered this encounter  Medications   celecoxib  (CELEBREX ) 200 MG capsule    Sig: Take 1 capsule (200 mg total) by mouth daily.    Dispense:  60 capsule    Refill:  1    NA   No orders of the defined types were placed in this encounter.    Procedures: Small Joint Inj: R thumb CMC on 09/10/2023 11:05 AM Indications: pain Details: 25 G needle, radial approach Medications: 0.5 mL lidocaine  1 %; 40 mg methylPREDNISolone  acetate 40 MG/ML Outcome: tolerated well, no immediate complications  R-thumb CMC joint injection, tolerated well. Procedurally successful injection performed. Procedure, treatment alternatives, risks and benefits explained, specific risks discussed. Consent was given by the patient. Immediately prior to procedure a time out was called to verify the correct patient, procedure, equipment, support staff and site/side marked as required. Patient was prepped and draped in the usual sterile fashion.          Clinical History: No specialty comments available.  She reports that she has never smoked. She has never used smokeless tobacco.  Recent Labs    09/23/22 1516 07/27/23 0447  HGBA1C 5.7* 5.7*  Objective:    Physical Exam  Gen: Well-appearing, in no acute distress; non-toxic CV: Well-perfused. Warm.  Resp: Breathing unlabored on room air; no wheezing. Psych: Fluid speech in conversation; appropriate affect; normal thought process  Ortho Exam - Bilateral hands/thumbs: There is bony bossing of bilateral CMC joints, left greater than right.  There is a small effusion of the left CMC joint.  There is pain and weakness with CMC grind and with thumb flex/extension.   Imaging:  *Previous  x-ray imaging of the right and left hand from 07/10/2022 shows severe left CMC arthritis with notable subchondral cystic change.  There is moderate right CMC joint arthritic change.  Past Medical/Family/Surgical/Social History: Medications & Allergies reviewed per EMR, new medications updated. 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   Hypokalemia 11/25/2020   S/P laparoscopic fundoplication 10/31/2020   Status post total replacement of left hip 01/31/2020   Unilateral primary osteoarthritis, left hip 12/21/2019   S/P shoulder replacement, right 11/15/2019   Overweight (BMI 25.0-29.9) 04/15/2019   Diverticulosis 04/14/2019   Chronic diastolic CHF (congestive heart failure) (HCC) 04/14/2019   Unstable angina (HCC) 10/20/2017   Chronic pain 09/23/2016   Pre-diabetes 09/02/2016   Recurrent major depressive disorder, in partial remission (HCC) 07/14/2016   Seizures (HCC) 07/14/2016   GAD (generalized anxiety disorder) 07/14/2016   Insomnia 09/05/2014   Allergic rhinitis 09/05/2014   Cervical spondylosis without myelopathy 12/19/2013    Class: Chronic   Neural foraminal stenosis of cervical spine 12/19/2013   Cervical radiculitis 09/19/2013   Lumbosacral spondylosis without myelopathy 11/16/2012   Postlaminectomy syndrome, lumbar region 11/16/2012   Herpes simplex virus (HSV) infection 10/31/2008   Hyperlipidemia with target LDL less than 100 10/31/2008   Primary hypertension 10/31/2008   GERD 10/31/2008   SPINAL STENOSIS OF LUMBAR REGION 10/31/2008   Myalgia 10/31/2008   Osteoporosis 10/31/2008   SPONDYLOLISTHESIS 10/31/2008   SYNCOPE 10/31/2008    Sleep apnea 10/31/2008   Past Medical History:  Diagnosis Date   Allergy    Anemia    Anginal pain (HCC)    last time    Anxiety    Arthritis    RHEUMATOID   Asthma    Bipolar 1 disorder (HCC)    Blood transfusion without reported diagnosis    x 3   Cataracts, bilateral 07/2017   CHF (congestive heart failure) (HCC)    COPD (chronic obstructive pulmonary disease) (HCC)    Coronary artery disease    reported hx of MI;  Echo 2009 with normal LVF;  Myoview  05/2011: no ischemia   Depression    Diabetes mellitus without complication (HCC)    Dyslipidemia    Dysrhythmia    SVT   Elevated liver enzymes 06/30/2022   Esophageal stricture    Fibromyalgia    GERD (gastroesophageal reflux disease)    H/O hiatal hernia    Head injury, unspecified    Headache    migraines   Herpes simplex infection    History of kidney stones    History of loop recorder 08/10/2017   Managed by Dr. Jalene Mayor   Hyperlipidemia    Hypertension    Insomnia    Myocardial infarction (  [facility])    age 15   Osteoporosis    Pneumonia    hx   Seizures (HCC)    none in the last 3-4 years as of 11/03/22 per patient   Shortness of breath    Sleep apnea    mild, does not require positive pressure treatment. (02/03/22)   Spinal stenosis of lumbar region    Spondylolisthesis    Status post placement of implantable loop recorder    Supraventricular tachycardia (HCC)    Syncope and collapse    s/p ILR; no arhythmogenic cause identified   UTI (lower urinary tract infection)    Family History  Problem Relation Age of Onset   Heart attack Father    Mental illness Father    Mental illness Mother    Heart attack Brother        stents   Alcohol  abuse Brother    Heart disease Brother    Drug abuse Brother    Diabetes Brother    Colon cancer Maternal Aunt    Cirrhosis Brother    Stomach cancer Neg Hx    Esophageal cancer Neg Hx    Rectal cancer Neg Hx    Past Surgical History:  Procedure Laterality Date    BACK SURGERY     BREAST EXCISIONAL BIOPSY Right    1990s   BREAST SURGERY     lumpectomy   CARDIAC CATHETERIZATION  10/06/2011   CATARACT EXTRACTION W/PHACO Right 07/31/2017   Procedure: CATARACT EXTRACTION PHACO AND INTRAOCULAR LENS PLACEMENT (IOC);  Surgeon: Tarri Farm, MD;  Location: [facility];  Service: Ophthalmology;  Laterality: Right;  CDE: 2.33   CATARACT EXTRACTION W/PHACO Left 08/14/2017   Procedure: CATARACT EXTRACTION PHACO AND INTRAOCULAR LENS PLACEMENT (IOC);  Surgeon: Tarri Farm, MD;  Location: [facility];  Service: Ophthalmology;  Laterality: Left;  CDE: 2.74   CHOLECYSTECTOMY     CYSTOSCOPY     stone   DIAGNOSTIC LAPAROSCOPY     laparoscopic cholecystectomy   DOPPLER ECHOCARDIOGRAPHY  2009   ESOPHAGOGASTRODUODENOSCOPY N/A 10/31/2020   Procedure: ESOPHAGOGASTRODUODENOSCOPY (EGD);  Surgeon: Aldean Hummingbird, MD;  Location: [facility];  Service: General;  Laterality: N/A;   EYE SURGERY Bilateral 08/14/2017   cataract removal   GLUTEUS MINIMUS REPAIR Left 11/04/2022   Procedure: LEFT GLUTEUS MEDIUS REPAIR;  Surgeon: Wilhelmenia Harada, MD;  Location: [facility];  Service: Orthopedics;  Laterality: Left;   head up tilt table testing  06/15/2007   [name]   HEMORRHOID SURGERY     HERNIA REPAIR     insertion of implatable loop recorder  08/11/2007   [name]   POSTERIOR CERVICAL FUSION/FORAMINOTOMY N/A 12/19/2013   Procedure: RIGHT C3-4.C4-5 AND C5-6 FORAMINOTOMIES;  Surgeon: Alphonso Jean, MD;  Location: [facility] OR;  Service: Orthopedics;  Laterality: N/A;   TOTAL HIP ARTHROPLASTY Left 01/31/2020   Procedure: LEFT TOTAL HIP ARTHROPLASTY ANTERIOR APPROACH;  Surgeon: Arnie Lao, MD;  Location: [facility] OR;  Service: Orthopedics;  Laterality: Left;   TOTAL SHOULDER ARTHROPLASTY Right 11/15/2019   Procedure: RIGHT TOTAL SHOULDER ARTHROPLASTY;  Surgeon: Jasmine Mesi, MD;  Location: [facility];  Service: Orthopedics;  Laterality: Right;   TOTAL SHOULDER REVISION Right  06/11/2021   Procedure: RIGHT SHOULDER CONVERSION TOTAL SHOULDER ARTHROPLASTY to REVERSE TOTAL SHOULDER ARTHROPLASTY;  Surgeon: Jasmine Mesi, MD;  Location[facility] OR;  Service: Orthopedics;  Laterality: Right;   TUBAL LIGATION     UPPER GASTROINTESTINAL ENDOSCOPY     XI ROBOTIC ASSISTED HIATAL HERNIA REPAIR N/A  10/31/2020   Procedure: XI ROBOTIC ASSISTED HIATAL HERNIA REPAIR WITH PARTIAL FUNDOPLICATION;  Surgeon: Aldean Hummingbird, MD;  Location: [facility];  Service: General;  Laterality: N/A;   Social History   Occupational History   Occupation: Disability    Comment: 15 years  Tobacco Use   Smoking status: Never   Smokeless tobacco: Never  Vaping Use   Vaping status: Never Used  Substance and Sexual Activity   Alcohol  use: No    Alcohol /week: 0.0 standard drinks of alcohol    Drug use: No   Sexual activity: Yes    Birth control/protection: Post-menopausal

## 2023-09-11 ENCOUNTER — Ambulatory Visit (INDEPENDENT_AMBULATORY_CARE_PROVIDER_SITE_OTHER): Admitting: Nurse Practitioner

## 2023-09-11 ENCOUNTER — Encounter: Payer: Self-pay | Admitting: Nurse Practitioner

## 2023-09-11 VITALS — BP 134/83 | HR 68 | Temp 97.4°F | Ht 62.0 in | Wt 127.0 lb

## 2023-09-11 DIAGNOSIS — Q762 Congenital spondylolisthesis: Secondary | ICD-10-CM

## 2023-09-11 DIAGNOSIS — R7303 Prediabetes: Secondary | ICD-10-CM

## 2023-09-11 DIAGNOSIS — Z79891 Long term (current) use of opiate analgesic: Secondary | ICD-10-CM | POA: Diagnosis not present

## 2023-09-11 MED ORDER — FREESTYLE LIBRE 3 PLUS SENSOR MISC: 1.0000 | 5 refills | Status: DC

## 2023-09-11 MED ORDER — HYDROCODONE-ACETAMINOPHEN 7.5-325 MG PO TABS: 1.0000 | ORAL_TABLET | Freq: Three times a day (TID) | ORAL | 0 refills | Status: DC

## 2023-09-11 MED ORDER — HYDROCODONE-ACETAMINOPHEN 7.5-325 MG PO TABS: 1.0000 | ORAL_TABLET | Freq: Three times a day (TID) | ORAL | 0 refills | Status: AC

## 2023-09-11 NOTE — Progress Notes (Signed)
 Subjective:    Patient ID: [name], female    DOB: [birth year], 68 y.o.   MRN: [id number]   Chief Complaint: pain management   HPI:  [name] is a 68 y.o. who identifies as a female who was assigned female at birth.   Social history: Lives with: her granddaughter lives with her Work history: disability   Comes in today for follow up of the following chronic medical issues:  1. Lumbosacral spondylosis without myelopathy 2. SPONDYLOLISTHESIS Pain assessment: Cause of pain- spondylolisthesis Pain location- mainly in lumbar spine Pain on scale of 1-10- 7-9/10 Frequency- daily What increases pain-to much activity What makes pain Better-rest helps Effects on ADL - none most days Any change in general medical condition-none  Current opioids rx- norco 7.5/325 # meds rx- 90 Effectiveness of current meds-helps Adverse reactions from pain meds-none Morphine equivalent- 22.5  Pill count performed-No Last drug screen - 03/27/22 ( high risk q66m, moderate risk q72m, low risk yearly ) Urine drug screen today- No Was the [facility] reviewed- yes  If yes were their any concerning findings? - no   Overdose risk: 1    09/12/2021    3:09 PM  Opioid Risk   Alcohol  0  Illegal Drugs 0  Rx Drugs 0  Alcohol  0  Illegal Drugs 0  Rx Drugs 0  Age between 16-45 years  0  History of Preadolescent Sexual Abuse 3  Psychological Disease 2  ADD Negative  OCD Negative  Bipolar Positive  Depression 1  Opioid Risk Tool Scoring 6  Opioid Risk Interpretation Moderate Risk     Pain contract signed on: 04/02/22    New complaints: Patient blood sugars go up and down. Has frequent black outs. They have her checking her blood sugars frequently. Says BS are all over the place. As high as 300 and as low as 40. Has more lows then highs. Would like CGM  Allergies  Allergen Reactions   Codeine  Other (See Comments)    I will have a heart attack.   Morphine And Codeine  Other (See  Comments)    It will cause me to have a heart attack.   Ambien  [Zolpidem  Tartrate] Nausea And Vomiting   Clonidine  Derivatives Nausea And Vomiting    GERD - caused acid reflux per pt   Metformin  And Related Nausea And Vomiting    Cramping from metformin    Lyrica  [Pregabalin ] Swelling and Other (See Comments)    Weight gain   Neurontin [Gabapentin] Other (See Comments)    Causes elevated LFTs    Outpatient Encounter Medications as of 09/11/2023  Medication Sig   Accu-Chek Softclix Lancets lancets test blood sugar up to 4 times a day as directed. Dx R73.03   acyclovir  ointment (ZOVIRAX ) 5 % apply 1 application topically every 3 (three) hours as needed (outbreaks).   albuterol  (VENTOLIN  HFA) 108 (90 Base) MCG/ACT inhaler inhale 2 puffs every 6 hours as needed for shortness of breath and wheezing.   Alcohol  Swabs  (B-D SINGLE USE SWABS  REGULAR) PADS USE 1 PAD DAILY WHEN CHECKING BLOOD SUGAR. R73.03   blood glucose meter kit and supplies Dispense based on patient and insurance preference. Use up to four times daily as directed. (FOR ICD-10 E10.9, E11.9).   busPIRone  (BUSPAR ) 10 MG tablet Take 1 tablet (10 mg total) by mouth 2 (two) times daily as needed.   butalbital -acetaminophen -caffeine  (FIORICET ) 50-325-40 MG tablet take 1 tablet by mouth every 6 (six) hours as needed for headache.   butalbital -acetaminophen -caffeine  (  FIORICET ) 50-325-40 MG tablet take 1 tablet by mouth every 6 (six) hours as needed for headache.   carvedilol  (COREG ) 3.125 MG tablet TAKE 1/2 (0.5) TABLET TWICE A DAY WITH A MEAL; BLOOD PRESSURE OVER 150 BEFORE TAKING. (Patient taking differently: 3.125 mg. TAKE 1/2 (0.5) TABLET TWICE A DAY WITH A MEAL; BLOOD PRESSURE OVER 150 BEFORE TAKING. Taking PRN)   celecoxib  (CELEBREX ) 200 MG capsule Take 1 capsule (200 mg total) by mouth daily.   dexlansoprazole  (DEXILANT ) 60 MG capsule Take 1 capsule (60 mg total) by mouth daily.   Dextromethorphan-GG-APAP (CORICIDIN HBP  COLD/COUGH/FLU) 10-200-325 MG/15ML LIQD Take 30 mLs by mouth every 4 (four) hours as needed (cough).   diclofenac  Sodium (VOLTAREN ) 1 % GEL APPLY 4 GRAMS TOPICALLY 4 TIMES A DAY. (Patient taking differently: Apply 4 g topically 4 (four) times daily as needed (pain).)   DULoxetine  (CYMBALTA ) 60 MG capsule Take 1 capsule (60 mg total) by mouth at bedtime.   empagliflozin  (JARDIANCE ) 25 MG TABS tablet Take 1 tablet (25 mg total) by mouth daily.   escitalopram  (LEXAPRO ) 20 MG tablet Take 1 tablet (20 mg total) by mouth daily.   estradiol  (ESTRACE ) 0.1 MG/GM vaginal cream Place 1 Applicatorful vaginally 3 (three) times a week.   FIBER PO Take 1 tablet by mouth in the morning and at bedtime.   fluconazole  (DIFLUCAN ) 150 MG tablet 1 po q week x 4 weeks   fludrocortisone  (FLORINEF ) 0.1 MG tablet take 1 tablet (100mcg total) by mouth every other day.   fluticasone  (FLONASE ) 50 MCG/ACT nasal spray USE 1 SPRAY IN EACH NOSTRIL ONCE DAILY. (Patient taking differently: Place 1 spray into both nostrils daily as needed for allergies.)   fluticasone  furoate-vilanterol (BREO ELLIPTA ) 100-25 MCG/ACT AEPB Inhale 1 puff by mouth once daily   furosemide  (LASIX ) 40 MG tablet TAKE 1 TABLET DAILY AS NEEDED FOR EXCESSIVE FLUID.   Glucagon , rDNA, (GLUCAGON  EMERGENCY) 1 MG KIT INJECT 1 MG INTO THE SKIN AS NEEDED   glucose blood (ACCU-CHEK GUIDE) test strip TEST BLOOD SUGAR UP TO 4 TIMES A DAY AS DIRECTED. R73.03   hydrALAZINE  (APRESOLINE ) 25 MG tablet take 1 tablet by mouth 3 times daily as needed (for severe hypertension/systolic number 170 or greater).   HYDROcodone -acetaminophen  (NORCO) 7.5-325 MG tablet Take 1 tablet by mouth in the morning, at noon, and at bedtime.   ipratropium (ATROVENT ) 0.02 % nebulizer solution USE 1 VIAL ( ) IN NEBULIZER EVERY 6 HOURS AS NEEDED FOR WHEEZING OR SHORTNESS OF BREATH.   isosorbide  mononitrate (IMDUR ) 60 MG 24 hr tablet Take 1 tablet (60 mg total) by mouth 2 (two) times daily.    lamoTRIgine  (LAMICTAL ) 150 MG tablet Take 1 tablet (150 mg total) by mouth daily.   levETIRAcetam  (KEPPRA ) 500 MG tablet Take 1 tablet (500 mg total) by mouth 2 (two) times daily.   levocetirizine (XYZAL ) 5 MG tablet take 1 tablet by mouth every morning.   lidocaine  (LIDODERM ) 5 % place 1 patch onto the skin daily. remove & discard patch within 12 hours or as directed by md   lidocaine  (XYLOCAINE ) 5 % ointment apply to affected area 3 times a day as needed for mild or moderate pain.   methocarbamol  (ROBAXIN ) 500 MG tablet take 1 tablet (500 milligram total) by mouth 4 (four) times daily.   metoCLOPramide  (REGLAN ) 10 MG tablet Take 1 tablet (10 mg total) by mouth every 6 (six) hours.   nitroGLYCERIN  (NITROSTAT ) 0.4 MG SL tablet place one (1) tablet under tongue every  5 minutes up to (3) doses as needed for chest pain.   ondansetron  (ZOFRAN ) 4 MG tablet take 1 tablet by mouth every 8 hours as needed for nausea and vomiting.   oxybutynin  (DITROPAN -XL) 5 MG 24 hr tablet Take 1 tablet (5 mg total) by mouth at bedtime.   polyethylene glycol (MIRALAX  / GLYCOLAX ) 17 g packet Take 17 g by mouth 2 (two) times daily.   Probiotic Product (PROBIOTIC PO) Take 1 capsule by mouth in the morning.   promethazine -dextromethorphan (PROMETHAZINE -DM) 6.25-15 MG/5ML syrup Take 5 mLs by mouth 3 (three) times daily as needed for cough.   rosuvastatin  (CRESTOR ) 10 MG tablet Take 1 tablet (10 mg total) by mouth daily.   senna-docusate (SENOKOT-S) 8.6-50 MG tablet Take 1 tablet by mouth 2 (two) times daily.   traZODone  (DESYREL ) 150 MG tablet Take 1 tablet (150 mg total) by mouth at bedtime.   valACYclovir  (VALTREX ) 1000 MG tablet Take 1 tablet (1,000 mg total) by mouth 3 (three) times daily as needed (with active outbreaks.).   valACYclovir  (VALTREX ) 500 MG tablet TAKE (1) TABLET TWICE DAILY.   No facility-administered encounter medications on file as of 09/11/2023.    Past Surgical History:  Procedure Laterality Date    BACK SURGERY     BREAST EXCISIONAL BIOPSY Right    1990s   BREAST SURGERY     lumpectomy   CARDIAC CATHETERIZATION  10/06/2011   CATARACT EXTRACTION W/PHACO Right 07/31/2017   Procedure: CATARACT EXTRACTION PHACO AND INTRAOCULAR LENS PLACEMENT (IOC);  Surgeon: Tarri Farm, MD;  Location: [facility];  Service: Ophthalmology;  Laterality: Right;  CDE: 2.33   CATARACT EXTRACTION W/PHACO Left 08/14/2017   Procedure: CATARACT EXTRACTION PHACO AND INTRAOCULAR LENS PLACEMENT (IOC);  Surgeon: Tarri Farm, MD;  Location: [facility];  Service: Ophthalmology;  Laterality: Left;  CDE: 2.74   CHOLECYSTECTOMY     CYSTOSCOPY     stone   DIAGNOSTIC LAPAROSCOPY     laparoscopic cholecystectomy   DOPPLER ECHOCARDIOGRAPHY  2009   ESOPHAGOGASTRODUODENOSCOPY N/A 10/31/2020   Procedure: ESOPHAGOGASTRODUODENOSCOPY (EGD);  Surgeon: Aldean Hummingbird, MD;  Location: [facility];  Service: General;  Laterality: N/A;   EYE SURGERY Bilateral 08/14/2017   cataract removal   GLUTEUS MINIMUS REPAIR Left 11/04/2022   Procedure: LEFT GLUTEUS MEDIUS REPAIR;  Surgeon: Wilhelmenia Harada, MD;  Location: [facility];  Service: Orthopedics;  Laterality: Left;   head up tilt table testing  06/15/2007   [name]   HEMORRHOID SURGERY     HERNIA REPAIR     insertion of implatable loop recorder  08/11/2007   [name]   POSTERIOR CERVICAL FUSION/FORAMINOTOMY N/A 12/19/2013   Procedure: RIGHT C3-4.C4-5 AND C5-6 FORAMINOTOMIES;  Surgeon: Alphonso Jean, MD;  Location[facility];  Service: Orthopedics;  Laterality: N/A;   TOTAL HIP ARTHROPLASTY Left 01/31/2020   Procedure: LEFT TOTAL HIP ARTHROPLASTY ANTERIOR APPROACH;  Surgeon: Arnie Lao, MD;  Location: [facility];  Service: Orthopedics;  Laterality: Left;   TOTAL SHOULDER ARTHROPLASTY Right 11/15/2019   Procedure: RIGHT TOTAL SHOULDER ARTHROPLASTY;  Surgeon: Jasmine Mesi, MD;  Location: [facility];  Service: Orthopedics;  Laterality: Right;   TOTAL SHOULDER REVISION Right 06/11/2021    Procedure: RIGHT SHOULDER CONVERSION TOTAL SHOULDER ARTHROPLASTY to REVERSE TOTAL SHOULDER ARTHROPLASTY;  Surgeon: Jasmine Mesi, MD;  Location: [facility] OR;  Service: Orthopedics;  Laterality: Right;   TUBAL LIGATION     UPPER GASTROINTESTINAL ENDOSCOPY     XI ROBOTIC ASSISTED HIATAL HERNIA REPAIR N/A 10/31/2020  Procedure: XI ROBOTIC ASSISTED HIATAL HERNIA REPAIR WITH PARTIAL FUNDOPLICATION;  Surgeon: Aldean Hummingbird, MD;  Location: [facility];  Service: General;  Laterality: N/A;    Family History  Problem Relation Age of Onset   Heart attack Father    Mental illness Father    Mental illness Mother    Heart attack Brother        stents   Alcohol  abuse Brother    Heart disease Brother    Drug abuse Brother    Diabetes Brother    Colon cancer Maternal Aunt    Cirrhosis Brother    Stomach cancer Neg Hx    Esophageal cancer Neg Hx    Rectal cancer Neg Hx           Review of Systems  Constitutional:  Negative for diaphoresis.  Eyes:  Negative for pain.  Respiratory:  Negative for shortness of breath.   Cardiovascular:  Negative for chest pain, palpitations and leg swelling.  Gastrointestinal:  Negative for abdominal pain.  Endocrine: Negative for polydipsia.  Skin:  Negative for rash.  Neurological:  Negative for dizziness, weakness and headaches.  Hematological:  Does not bruise/bleed easily.  All other systems reviewed and are negative.      Objective:   Physical Exam Vitals and nursing note reviewed.  Constitutional:      General: She is not in acute distress.    Appearance: Normal appearance. She is well-developed.  Neck:     Vascular: No carotid bruit or JVD.   Cardiovascular:     Rate and Rhythm: Normal rate and regular rhythm.     Heart sounds: Normal heart sounds.  Pulmonary:     Effort: Pulmonary effort is normal. No respiratory distress.     Breath sounds: Normal breath sounds. No wheezing or rales.  Chest:     Chest wall: No tenderness.  Abdominal:      General: Bowel sounds are normal. There is no distension or abdominal bruit.     Palpations: Abdomen is soft. There is no hepatomegaly, splenomegaly, mass or pulsatile mass.     Tenderness: There is no abdominal tenderness.   Musculoskeletal:        General: Normal range of motion.     Cervical back: Normal range of motion and neck supple.  Lymphadenopathy:     Cervical: No cervical adenopathy.   Skin:    General: Skin is warm and dry.   Neurological:     Mental Status: She is alert and oriented to person, place, and time.     Deep Tendon Reflexes: Reflexes are normal and symmetric.   Psychiatric:        Behavior: Behavior normal.        Thought Content: Thought content normal.        Judgment: Judgment normal.    BP 134/83   Pulse 68   Temp (!) 97.4 F (36.3 C) (Temporal)   Ht 5' 2 (1.575 m)   Wt 127 lb (57.6 kg)   SpO2 92%   BMI 23.23 kg/m          Assessment & Plan:  [name] in today with chief complaint of No chief complaint on file.   1. Lumbosacral spondylosis without myelopathy (Primary) Moist heat rets  2. SPONDYLOLISTHESIS  - HYDROcodone -acetaminophen  (NORCO) 7.5-325 MG tablet; Take 1 tablet by mouth in the morning, at noon, and at bedtime.  Dispense: 90 tablet; Refill: 0 - HYDROcodone -acetaminophen  (NORCO) 7.5-325 MG tablet; Take 1 tablet by  mouth in the morning, at noon, and at bedtime.  Dispense: 90 tablet; Refill: 0 - HYDROcodone -acetaminophen  (NORCO) 7.5-325 MG tablet; Take 1 tablet by mouth in the morning, at noon, and at bedtime.  Dispense: 90 tablet; Refill: 0  3. Pre diabetes Blood sugars all over the place Ordered CGM- will probably need prior auth  [name] Gaylyn Keas, FNP

## 2023-09-16 LAB — TOXASSURE SELECT 13 (MW), URINE

## 2023-09-19 ENCOUNTER — Other Ambulatory Visit: Payer: Self-pay | Admitting: Cardiology

## 2023-09-19 ENCOUNTER — Other Ambulatory Visit: Payer: Self-pay | Admitting: Nurse Practitioner

## 2023-09-19 DIAGNOSIS — B009 Herpesviral infection, unspecified: Secondary | ICD-10-CM

## 2023-09-19 DIAGNOSIS — F411 Generalized anxiety disorder: Secondary | ICD-10-CM

## 2023-09-28 ENCOUNTER — Other Ambulatory Visit: Payer: Self-pay | Admitting: Nurse Practitioner

## 2023-09-28 DIAGNOSIS — R569 Unspecified convulsions: Secondary | ICD-10-CM

## 2023-09-29 ENCOUNTER — Ambulatory Visit: Admitting: Orthopedic Surgery

## 2023-10-07 ENCOUNTER — Ambulatory Visit (INDEPENDENT_AMBULATORY_CARE_PROVIDER_SITE_OTHER): Admitting: Orthopedic Surgery

## 2023-10-07 ENCOUNTER — Other Ambulatory Visit (INDEPENDENT_AMBULATORY_CARE_PROVIDER_SITE_OTHER)

## 2023-10-07 DIAGNOSIS — M1812 Unilateral primary osteoarthritis of first carpometacarpal joint, left hand: Secondary | ICD-10-CM | POA: Diagnosis not present

## 2023-10-07 NOTE — Progress Notes (Signed)
 [name] - 68 y.o. female MRN [id number]  Date of birth: 07/20/1955  Office Visit Note: Visit Date: 10/07/2023 PCP: Erica Mustard, FNP Referred by: Erica, [name], *  Subjective: No chief complaint on file.  HPI: [name] is a pleasant 68 y.o. female who presents today as a referral from my partner [name] for specific hand surgical evaluation to discuss left thumb [facility] arthroplasty.  She has had symptoms for multiple years refractory to conservative care in the form of bracing and prior injections.  At this juncture, she would like to discuss surgical intervention.  Pertinent ROS were reviewed with the patient and found to be negative unless otherwise specified above in HPI.   Visit Reason:Discuss Left CMC arthroplasty  Duration of symptoms: 2 years Hand dominance: right Occupation:disability Diabetic: Yes-5.7 Smoking: No Heart/Lung History: CHF Blood Thinners: none  Prior Testing/EMG:xrays 1 year ago Injections (Date):[name] inj on 6/12-6 injs over last two years Treatments: Bracing and injections Prior Surgery:none  Assessment & Plan: Visit Diagnoses:  1. Primary osteoarthritis of first carpometacarpal joint of left hand     Plan: Extensive discussion was had with the patient today regarding her ongoing left thumb CMC osteoarthritis.  X-rays were reviewed in detail today which do show significant degenerative change at the left thumb [facility] articulation.  We discussed treatment modalities ranging from conservative to surgical.  From a conservative standpoint we discussed bracing, injections, anti-inflammatory medications and activity modification.  From a surgical standpoint we discussed [facility] arthroplasty, risks and benefits as well as the postoperative protocol.  She is also demonstrating mild MP hyperextension of the thumb, for this reason we can perform capsulodesis and pinning of the thumb MP as well.  At this juncture, given the  severity of symptoms that have remained refractory to conservative care, patient would like to move forward with surgical intervention.  Given the significance of the degenerative change on x-ray which clinically correlates with examination, we can move forward with surgical scheduling of left thumb CMC arthroplasty at the next available date.  Risks and benefits of the procedure were discussed, risks including but not limited to infection, bleeding, scarring, stiffness, nerve injury, tendon injury, vascular injury, hardware complication, recurrence of symptoms and need for subsequent operation.  We also discussed the specifics of the postoperative protocol and the appropriate timeline.  Patient expressed understanding.   Follow-up: No follow-ups on file.   Meds & Orders: No orders of the defined types were placed in this encounter.   Orders Placed This Encounter  Procedures   XR Wrist Complete Left     Procedures: No procedures performed      Clinical History: No specialty comments available.  She reports that she has never smoked. She has never used smokeless tobacco.  Recent Labs    07/27/23 0447  HGBA1C 5.7*    Objective:   Vital Signs: There were no vitals taken for this visit.  Physical Exam  Gen: Well-appearing, in no acute distress; non-toxic CV: Regular Rate. Well-perfused. Warm.  Resp: Breathing unlabored on room air; no wheezing. Psych: Fluid speech in conversation; appropriate affect; normal thought process  Ortho Exam General: Patient is well appearing and in no distress.    Skin and Muscle: Muscle bulk and contour normal, no signs of atrophy.      Range of Motion and Palpation Tests: Mobility is full about the elbows with flexion and extension.  Forearm supination and pronation are 85/85 bilaterally.  Wrist flexion/extension is  75/65 bilaterally.  Digital flexion and extension are full.  Thumb opposition is full to the base of the small fingers bilaterally.      No cords or nodules are palpated.  No triggering is observed.     Significant tenderness over the left thumb CMC articulation is observed, positive grind for pain, positive crepitus.  MP hyperextension mild, approximately 10 degrees.    Finklestein test mildly positive   Neurologic, Vascular, Motor: Sensation is intact to light touch in the median/radial/ulnar distributions.  Tinel's testing negative at wrist level.  Fingers pink and well perfused.  Capillary refill is brisk.     Imaging: XR Wrist Complete Left Result Date: 10/07/2023 X-rays of the left wrist demonstrate significant degenerative change of the thumb [facility] articulation with joint space narrowing, osteophyte formation and subchondral sclerosis.  There is associated STT arthritis as well.   Past Medical/Family/Surgical/Social History: Medications & Allergies reviewed per EMR, new medications updated. 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   Hypokalemia 11/25/2020   S/P laparoscopic fundoplication 10/31/2020   Status post total replacement of left hip 01/31/2020   Unilateral primary osteoarthritis, left hip 12/21/2019   S/P shoulder replacement, right 11/15/2019   Overweight (BMI 25.0-29.9) 04/15/2019   Diverticulosis 04/14/2019   Chronic diastolic CHF (congestive heart failure) (HCC) 04/14/2019   Unstable angina (HCC) 10/20/2017   Chronic pain 09/23/2016   Pre-diabetes 09/02/2016   Recurrent major depressive disorder, in partial remission (HCC) 07/14/2016   Seizures (HCC) 07/14/2016   GAD (generalized anxiety disorder) 07/14/2016   Insomnia 09/05/2014   Allergic rhinitis  09/05/2014   Cervical spondylosis without myelopathy 12/19/2013    Class: Chronic   Neural foraminal stenosis of cervical spine 12/19/2013   Cervical radiculitis 09/19/2013   Lumbosacral spondylosis without myelopathy 11/16/2012   Postlaminectomy syndrome, lumbar region 11/16/2012   Herpes simplex virus (HSV) infection 10/31/2008   Hyperlipidemia with target LDL less than 100 10/31/2008   Primary hypertension 10/31/2008   GERD 10/31/2008   SPINAL STENOSIS OF LUMBAR REGION 10/31/2008   Myalgia 10/31/2008   Osteoporosis 10/31/2008   SPONDYLOLISTHESIS 10/31/2008   SYNCOPE 10/31/2008   Sleep apnea 10/31/2008   Past Medical History:  Diagnosis Date   Allergy    Anemia    Anginal pain (HCC)    last time    Anxiety    Arthritis    RHEUMATOID   Asthma    Bipolar 1 disorder (HCC)    Blood transfusion without reported diagnosis    x 3   Cataracts, bilateral 07/2017   CHF (congestive heart failure) (HCC)    COPD (chronic obstructive pulmonary disease) (HCC)    Coronary artery disease    reported hx of MI;  Echo 2009 with normal LVF;  Myoview  05/2011: no ischemia   Depression    Diabetes mellitus without complication (HCC)    Dyslipidemia    Dysrhythmia    SVT   Elevated liver enzymes 06/30/2022   Esophageal stricture    Fibromyalgia    GERD (gastroesophageal reflux disease)    H/O hiatal hernia    Head injury, unspecified    Headache    migraines   Herpes simplex infection  History of kidney stones    History of loop recorder 08/10/2017   Managed by Erica. Cathlyn Birmingham   Hyperlipidemia    Hypertension    Insomnia    Myocardial infarction [facility])    age 67   Osteoporosis    Pneumonia    hx   Seizures (HCC)    none in the last 3-4 years as of 11/03/22 per patient   Shortness of breath    Sleep apnea    mild, does not require positive pressure treatment. (02/03/22)   Spinal stenosis of lumbar region    Spondylolisthesis    Status post placement of implantable loop  recorder    Supraventricular tachycardia (HCC)    Syncope and collapse    s/p ILR; no arhythmogenic cause identified   UTI (lower urinary tract infection)    Family History  Problem Relation Age of Onset   Heart attack Father    Mental illness Father    Mental illness Mother    Heart attack Brother        stents   Alcohol  abuse Brother    Heart disease Brother    Drug abuse Brother    Diabetes Brother    Colon cancer Maternal Aunt    Cirrhosis Brother    Stomach cancer Neg Hx    Esophageal cancer Neg Hx    Rectal cancer Neg Hx    Past Surgical History:  Procedure Laterality Date   BACK SURGERY     BREAST EXCISIONAL BIOPSY Right    1990s   BREAST SURGERY     lumpectomy   CARDIAC CATHETERIZATION  10/06/2011   CATARACT EXTRACTION W/PHACO Right 07/31/2017   Procedure: CATARACT EXTRACTION PHACO AND INTRAOCULAR LENS PLACEMENT (IOC);  Surgeon: Harrie Agent, MD;  Location: [facility];  Service: Ophthalmology;  Laterality: Right;  CDE: 2.33   CATARACT EXTRACTION W/PHACO Left 08/14/2017   Procedure: CATARACT EXTRACTION PHACO AND INTRAOCULAR LENS PLACEMENT (IOC);  Surgeon: Harrie Agent, MD;  Location: [facility];  Service: Ophthalmology;  Laterality: Left;  CDE: 2.74   CHOLECYSTECTOMY     CYSTOSCOPY     stone   DIAGNOSTIC LAPAROSCOPY     laparoscopic cholecystectomy   DOPPLER ECHOCARDIOGRAPHY  2009   ESOPHAGOGASTRODUODENOSCOPY N/A 10/31/2020   Procedure: ESOPHAGOGASTRODUODENOSCOPY (EGD);  Surgeon: Tanda Locus, MD;  Location: [facility];  Service: General;  Laterality: N/A;   EYE SURGERY Bilateral 08/14/2017   cataract removal   GLUTEUS MINIMUS REPAIR Left 11/04/2022   Procedure: LEFT GLUTEUS MEDIUS REPAIR;  Surgeon: Genelle Standing, MD;  Location: [facility] OR;  Service: Orthopedics;  Laterality: Left;   head up tilt table testing  06/15/2007   Danelle Birmingham   HEMORRHOID SURGERY     HERNIA REPAIR     insertion of implatable loop recorder  08/11/2007   Danelle Birmingham   POSTERIOR CERVICAL  FUSION/FORAMINOTOMY N/A 12/19/2013   Procedure: RIGHT C3-4.C4-5 AND C5-6 FORAMINOTOMIES;  Surgeon: Agent FORBES Better, MD;  Location: [facility] OR;  Service: Orthopedics;  Laterality: N/A;   TOTAL HIP ARTHROPLASTY Left 01/31/2020   Procedure: LEFT TOTAL HIP ARTHROPLASTY ANTERIOR APPROACH;  Surgeon: Vernetta Lonni GRADE, MD;  Location: [facility] OR;  Service: Orthopedics;  Laterality: Left;   TOTAL SHOULDER ARTHROPLASTY Right 11/15/2019   Procedure: RIGHT TOTAL SHOULDER ARTHROPLASTY;  Surgeon: Addie Cordella Hamilton, MD;  Location: [facility];  Service: Orthopedics;  Laterality: Right;   TOTAL SHOULDER REVISION Right 06/11/2021   Procedure: RIGHT SHOULDER CONVERSION TOTAL SHOULDER ARTHROPLASTY to REVERSE TOTAL SHOULDER ARTHROPLASTY;  Surgeon: Addie,  Cordella Hamilton, MD;  Location: [facility] OR;  Service: Orthopedics;  Laterality: Right;   TUBAL LIGATION     UPPER GASTROINTESTINAL ENDOSCOPY     XI ROBOTIC ASSISTED HIATAL HERNIA REPAIR N/A 10/31/2020   Procedure: XI ROBOTIC ASSISTED HIATAL HERNIA REPAIR WITH PARTIAL FUNDOPLICATION;  Surgeon: Tanda Locus, MD;  Location: [facility];  Service: General;  Laterality: N/A;   Social History   Occupational History   Occupation: Disability    Comment: 15 years  Tobacco Use   Smoking status: Never   Smokeless tobacco: Never  Vaping Use   Vaping status: Never Used  Substance and Sexual Activity   Alcohol  use: No    Alcohol /week: 0.0 standard drinks of alcohol    Drug use: No   Sexual activity: Yes    Birth control/protection: Post-menopausal    Halea Lieb Afton Alderton, M.D. [facility], Hand Surgery

## 2023-10-09 ENCOUNTER — Ambulatory Visit (INDEPENDENT_AMBULATORY_CARE_PROVIDER_SITE_OTHER): Payer: 59

## 2023-10-09 VITALS — BP 134/83 | HR 68 | Ht 62.0 in | Wt 121.0 lb

## 2023-10-09 DIAGNOSIS — Z0000 Encounter for general adult medical examination without abnormal findings: Secondary | ICD-10-CM | POA: Diagnosis not present

## 2023-10-09 DIAGNOSIS — Z7189 Other specified counseling: Secondary | ICD-10-CM

## 2023-10-09 NOTE — Patient Instructions (Signed)
 Ms. Deshotel , Thank you for taking time out of your busy schedule to complete your Annual Wellness Visit with me. I enjoyed our conversation and look forward to speaking with you again next year. I, as well as your care team,  appreciate your ongoing commitment to your health goals. Please review the following plan we discussed and let me know if I can assist you in the future. Your Game plan/ To Do List    Follow up Visits: Next Medicare AWV with our clinical staff: 10/11/24 at 3:50p.m.   Have you seen your provider in the last 6 months (3 months if uncontrolled diabetes)? Yes Next Office Visit with your provider: 12/08/23 at 2:30p.m.  Clinician Recommendations:  Aim for 30 minutes of exercise or brisk walking, 6-8 glasses of water , and 5 servings of fruits and vegetables each day.       This is a list of the screening recommended for you and due dates:  Health Maintenance  Topic Date Due   Medicare Annual Wellness Visit  10/08/2023   Mammogram  03/18/2024*   Colon Cancer Screening  03/18/2024*   COVID-19 Vaccine (3 - 2024-25 season) 10/24/2024*   Flu Shot  10/30/2023   DEXA scan (bone density measurement)  10/08/2024   DTaP/Tdap/Td vaccine (3 - Td or Tdap) 12/21/2032   Pneumococcal Vaccine for age over 2  Completed   Hepatitis C Screening  Completed   Zoster (Shingles) Vaccine  Completed   Hepatitis B Vaccine  Aged Out   HPV Vaccine  Aged Out   Meningitis B Vaccine  Aged Out  *Topic was postponed. The date shown is not the original due date.    Advanced directives: (Declined) Advance directive discussed with you today. Even though you declined this today, please call our office should you change your mind, and we can give you the proper paperwork for you to fill out. Advance Care Planning is important because it:  [x]  Makes sure you receive the medical care that is consistent with your values, goals, and preferences  [x]  It provides guidance to your family and loved ones and  reduces their decisional burden about whether or not they are making the right decisions based on your wishes.  Follow the link provided in your after visit summary or read over the paperwork we have mailed to you to help you started getting your Advance Directives in place. If you need assistance in completing these, please reach out to us  so that we can help you!  See attachments for Preventive Care and Fall Prevention Tips.

## 2023-10-09 NOTE — Progress Notes (Signed)
 Subjective:   [name] is a 68 y.o. who presents for a Medicare Wellness preventive visit.  As a reminder, Annual Wellness Visits don't include a physical exam, and some assessments may be limited, especially if this visit is performed virtually. We may recommend an in-person follow-up visit with your provider if needed.  Visit Complete: Virtual I connected with  [name] on 10/09/23 by a audio enabled telemedicine application and verified that I am speaking with the correct person using two identifiers.  Patient Location: Home  Provider Location: Home Office  I discussed the limitations of evaluation and management by telemedicine. The patient expressed understanding and agreed to proceed.  Vital Signs: Because this visit was a virtual/telehealth visit, some criteria may be missing or patient reported. Any vitals not documented were not able to be obtained and vitals that have been documented are patient reported.  VideoDeclined- This patient declined [name], academic. Therefore the visit was completed with audio only.  Persons Participating in Visit: Patient.  AWV Questionnaire: No: Patient Medicare AWV questionnaire was not completed prior to this visit.  Cardiac Risk Factors include: advanced age (>32men, >24 women);diabetes mellitus;dyslipidemia     Objective:    Today's Vitals   10/09/23 1016 10/09/23 1019  BP: 134/83   Pulse: 68   Weight: 121 lb (54.9 kg)   Height: 5' 2 (1.575 m)   PainSc:  8    Body mass index is 22.13 kg/m.     10/09/2023   10:26 AM 07/26/2023    5:58 PM 07/26/2023   11:42 AM 07/24/2023    9:47 AM 06/05/2023    2:11 PM 05/29/2023    4:26 PM 11/20/2022    1:57 PM  Advanced Directives  Does Patient Have a Medical Advance Directive? No No No No No No No  Would patient like information on creating a medical advance directive?  No - Patient declined   No - Patient declined  Yes (MAU/Ambulatory/Procedural  Areas - Information given)    Current Medications (verified) Outpatient Encounter Medications as of 10/09/2023  Medication Sig   Accu-Chek Softclix Lancets lancets test blood sugar up to 4 times a day as directed. Dx R73.03   acyclovir  ointment (ZOVIRAX ) 5 % apply 1 application topically every 3 (three) hours as needed (outbreaks).   albuterol  (VENTOLIN  HFA) 108 (90 Base) MCG/ACT inhaler inhale 2 puffs every 6 hours as needed for shortness of breath and wheezing.   Alcohol  Swabs  (B-D SINGLE USE SWABS  REGULAR) PADS USE 1 PAD DAILY WHEN CHECKING BLOOD SUGAR. R73.03   blood glucose meter kit and supplies Dispense based on patient and insurance preference. Use up to four times daily as directed. (FOR ICD-10 E10.9, E11.9).   busPIRone  (BUSPAR ) 10 MG tablet Take 1 tablet (10 mg total) by mouth 2 (two) times daily as needed.   butalbital -acetaminophen -caffeine  (FIORICET ) 50-325-40 MG tablet take 1 tablet by mouth every 6 (six) hours as needed for headache.   butalbital -acetaminophen -caffeine  (FIORICET ) 50-325-40 MG tablet take 1 tablet by mouth every 6 (six) hours as needed for headache.   carvedilol  (COREG ) 3.125 MG tablet TAKE 1/2 (0.5) TABLET TWICE A DAY WITH A MEAL; BLOOD PRESSURE OVER 150 BEFORE TAKING. (Patient taking differently: 3.125 mg. TAKE 1/2 (0.5) TABLET TWICE A DAY WITH A MEAL; BLOOD PRESSURE OVER 150 BEFORE TAKING. Taking PRN)   celecoxib  (CELEBREX ) 200 MG capsule Take 1 capsule (200 mg total) by mouth daily.   Continuous Glucose Sensor (FREESTYLE LIBRE 3 PLUS  SENSOR) MISC Apply 1 each topically every 14 (fourteen) days. Change sensor every 15 days.   dexlansoprazole  (DEXILANT ) 60 MG capsule Take 1 capsule (60 mg total) by mouth daily.   Dextromethorphan-GG-APAP (CORICIDIN HBP COLD/COUGH/FLU) 10-200-325 MG/15ML LIQD Take 30 mLs by mouth every 4 (four) hours as needed (cough).   diclofenac  Sodium (VOLTAREN ) 1 % GEL APPLY 4 GRAMS TOPICALLY 4 TIMES A DAY. (Patient taking differently: Apply 4  g topically 4 (four) times daily as needed (pain).)   DULoxetine  (CYMBALTA ) 60 MG capsule Take 1 capsule (60 mg total) by mouth at bedtime.   empagliflozin  (JARDIANCE ) 25 MG TABS tablet Take 1 tablet (25 mg total) by mouth daily.   escitalopram  (LEXAPRO ) 20 MG tablet Take 1 tablet (20 mg total) by mouth daily.   estradiol  (ESTRACE ) 0.1 MG/GM vaginal cream Place 1 Applicatorful vaginally 3 (three) times a week.   FIBER PO Take 1 tablet by mouth in the morning and at bedtime.   fluconazole  (DIFLUCAN ) 150 MG tablet 1 po q week x 4 weeks   fludrocortisone  (FLORINEF ) 0.1 MG tablet take 1 tablet (100mcg total) by mouth every other day.   fluticasone  (FLONASE ) 50 MCG/ACT nasal spray USE 1 SPRAY IN EACH NOSTRIL ONCE DAILY. (Patient taking differently: Place 1 spray into both nostrils daily as needed for allergies.)   fluticasone  furoate-vilanterol (BREO ELLIPTA ) 100-25 MCG/ACT AEPB Inhale 1 puff by mouth once daily   furosemide  (LASIX ) 40 MG tablet TAKE 1 TABLET DAILY AS NEEDED FOR EXCESSIVE FLUID.   Glucagon , rDNA, (GLUCAGON  EMERGENCY) 1 MG KIT INJECT 1 MG INTO THE SKIN AS NEEDED   glucose blood (ACCU-CHEK GUIDE) test strip TEST BLOOD SUGAR UP TO 4 TIMES A DAY AS DIRECTED. R73.03   hydrALAZINE  (APRESOLINE ) 25 MG tablet take 1 tablet by mouth 3 times daily as needed (for severe hypertension/systolic number 170 or greater).   [START ON 11/12/2023] HYDROcodone -acetaminophen  (NORCO) 7.5-325 MG tablet Take 1 tablet by mouth in the morning, at noon, and at bedtime.   [START ON 10/13/2023] HYDROcodone -acetaminophen  (NORCO) 7.5-325 MG tablet Take 1 tablet by mouth in the morning, at noon, and at bedtime.   HYDROcodone -acetaminophen  (NORCO) 7.5-325 MG tablet Take 1 tablet by mouth in the morning, at noon, and at bedtime.   ipratropium (ATROVENT ) 0.02 % nebulizer solution USE 1 VIAL ( ) IN NEBULIZER EVERY 6 HOURS AS NEEDED FOR WHEEZING OR SHORTNESS OF BREATH.   isosorbide  mononitrate (IMDUR ) 60 MG 24 hr tablet Take  1 tablet (60 mg total) by mouth 2 (two) times daily.   lamoTRIgine  (LAMICTAL ) 150 MG tablet take 1 tablet (150 MILLIGRAM total) by mouth daily.   levETIRAcetam  (KEPPRA ) 500 MG tablet TAKE (1) TABLET TWICE DAILY.   levocetirizine (XYZAL ) 5 MG tablet take 1 tablet by mouth every morning.   lidocaine  (LIDODERM ) 5 % place 1 patch onto the skin daily. remove & discard patch within 12 hours or as directed by md   lidocaine  (XYLOCAINE ) 5 % ointment apply to affected area 3 times a day as needed for mild or moderate pain.   methocarbamol  (ROBAXIN ) 500 MG tablet take 1 tablet (500 milligram total) by mouth 4 (four) times daily.   metoCLOPramide  (REGLAN ) 10 MG tablet Take 1 tablet (10 mg total) by mouth every 6 (six) hours.   nitroGLYCERIN  (NITROSTAT ) 0.4 MG SL tablet place one (1) tablet under tongue every 5 minutes up to (3) doses as needed for chest pain.   ondansetron  (ZOFRAN ) 4 MG tablet take 1 tablet by mouth every 8  hours as needed for nausea and vomiting.   oxybutynin  (DITROPAN -XL) 5 MG 24 hr tablet Take 1 tablet (5 mg total) by mouth at bedtime.   polyethylene glycol (MIRALAX  / GLYCOLAX ) 17 g packet Take 17 g by mouth 2 (two) times daily.   Probiotic Product (PROBIOTIC PO) Take 1 capsule by mouth in the morning.   promethazine -dextromethorphan (PROMETHAZINE -DM) 6.25-15 MG/5ML syrup Take 5 mLs by mouth 3 (three) times daily as needed for cough.   rosuvastatin  (CRESTOR ) 10 MG tablet Take 1 tablet (10 mg total) by mouth daily.   senna-docusate (SENOKOT-S) 8.6-50 MG tablet Take 1 tablet by mouth 2 (two) times daily.   traZODone  (DESYREL ) 150 MG tablet Take 1 tablet (150 mg total) by mouth at bedtime.   valACYclovir  (VALTREX ) 1000 MG tablet Take 1 tablet (1,000 mg total) by mouth 3 (three) times daily as needed (with active outbreaks.).   valACYclovir  (VALTREX ) 500 MG tablet take (1) tablet twice daily.   No facility-administered encounter medications on file as of 10/09/2023.    Allergies  (verified) Codeine , Morphine and codeine , Ambien  [zolpidem  tartrate], Clonidine  derivatives, Metformin  and related, Lyrica  [pregabalin ], and Neurontin [gabapentin]   History: Past Medical History:  Diagnosis Date   Allergy    Anemia    Anginal pain (HCC)    last time    Anxiety    Arthritis    RHEUMATOID   Asthma    Bipolar 1 disorder (HCC)    Blood transfusion without reported diagnosis    x 3   Cataracts, bilateral 07/2017   CHF (congestive heart failure) (HCC)    COPD (chronic obstructive pulmonary disease) (HCC)    Coronary artery disease    reported hx of MI;  Echo 2009 with normal LVF;  Myoview  05/2011: no ischemia   Depression    Diabetes mellitus without complication (HCC)    Dyslipidemia    Dysrhythmia    SVT   Elevated liver enzymes 06/30/2022   Esophageal stricture    Fibromyalgia    GERD (gastroesophageal reflux disease)    H/O hiatal hernia    Head injury, unspecified    Headache    migraines   Herpes simplex infection    History of kidney stones    History of loop recorder 08/10/2017   Managed by Dr. Cathlyn Birmingham   Hyperlipidemia    Hypertension    Insomnia    Myocardial infarction [facility])    age 59   Osteoporosis    Pneumonia    hx   Seizures (HCC)    none in the last 3-4 years as of 11/03/22 per patient   Shortness of breath    Sleep apnea    mild, does not require positive pressure treatment. (02/03/22)   Spinal stenosis of lumbar region    Spondylolisthesis    Status post placement of implantable loop recorder    Supraventricular tachycardia (HCC)    Syncope and collapse    s/p ILR; no arhythmogenic cause identified   UTI (lower urinary tract infection)    Past Surgical History:  Procedure Laterality Date   BACK SURGERY     BREAST EXCISIONAL BIOPSY Right    1990s   BREAST SURGERY     lumpectomy   CARDIAC CATHETERIZATION  10/06/2011   CATARACT EXTRACTION W/PHACO Right 07/31/2017   Procedure: CATARACT EXTRACTION PHACO AND INTRAOCULAR  LENS PLACEMENT (IOC);  Surgeon: Harrie Agent, MD;  Location: [facility];  Service: Ophthalmology;  Laterality: Right;  CDE: 2.33   CATARACT EXTRACTION W/PHACO  Left 08/14/2017   Procedure: CATARACT EXTRACTION PHACO AND INTRAOCULAR LENS PLACEMENT (IOC);  Surgeon: Harrie Agent, MD;  Location: [facility];  Service: Ophthalmology;  Laterality: Left;  CDE: 2.74   CHOLECYSTECTOMY     CYSTOSCOPY     stone   DIAGNOSTIC LAPAROSCOPY     laparoscopic cholecystectomy   DOPPLER ECHOCARDIOGRAPHY  2009   ESOPHAGOGASTRODUODENOSCOPY N/A 10/31/2020   Procedure: ESOPHAGOGASTRODUODENOSCOPY (EGD);  Surgeon: Tanda Locus, MD;  Location: [facility];  Service: General;  Laterality: N/A;   EYE SURGERY Bilateral 08/14/2017   cataract removal   GLUTEUS MINIMUS REPAIR Left 11/04/2022   Procedure: LEFT GLUTEUS MEDIUS REPAIR;  Surgeon: Genelle Standing, MD;  Location: [facility] OR;  Service: Orthopedics;  Laterality: Left;   head up tilt table testing  06/15/2007   Danelle Birmingham   HEMORRHOID SURGERY     HERNIA REPAIR     insertion of implatable loop recorder  08/11/2007   Danelle Birmingham   POSTERIOR CERVICAL FUSION/FORAMINOTOMY N/A 12/19/2013   Procedure: RIGHT C3-4.C4-5 AND C5-6 FORAMINOTOMIES;  Surgeon: Agent FORBES Better, MD;  Location: [facility] OR;  Service: Orthopedics;  Laterality: N/A;   TOTAL HIP ARTHROPLASTY Left 01/31/2020   Procedure: LEFT TOTAL HIP ARTHROPLASTY ANTERIOR APPROACH;  Surgeon: Vernetta Lonni GRADE, MD;  Location: [facility] OR;  Service: Orthopedics;  Laterality: Left;   TOTAL SHOULDER ARTHROPLASTY Right 11/15/2019   Procedure: RIGHT TOTAL SHOULDER ARTHROPLASTY;  Surgeon: Addie Cordella Hamilton, MD;  Location: [facility];  Service: Orthopedics;  Laterality: Right;   TOTAL SHOULDER REVISION Right 06/11/2021   Procedure: RIGHT SHOULDER CONVERSION TOTAL SHOULDER ARTHROPLASTY to REVERSE TOTAL SHOULDER ARTHROPLASTY;  Surgeon: Addie Cordella Hamilton, MD;  Location: [facility] OR;  Service: Orthopedics;  Laterality: Right;   TUBAL LIGATION     UPPER  GASTROINTESTINAL ENDOSCOPY     XI ROBOTIC ASSISTED HIATAL HERNIA REPAIR N/A 10/31/2020   Procedure: XI ROBOTIC ASSISTED HIATAL HERNIA REPAIR WITH PARTIAL FUNDOPLICATION;  Surgeon: Tanda Locus, MD;  Location: [facility];  Service: General;  Laterality: N/A;   Family History  Problem Relation Age of Onset   Heart attack Father    Mental illness Father    Mental illness Mother    Heart attack Brother        stents   Alcohol  abuse Brother    Heart disease Brother    Drug abuse Brother    Diabetes Brother    Colon cancer Maternal Aunt    Cirrhosis Brother    Stomach cancer Neg Hx    Esophageal cancer Neg Hx    Rectal cancer Neg Hx    Social History   Socioeconomic History   Marital status: Single    Spouse name: Not on file   Number of children: 5   Years of education: Not on file   Highest education level: 5th grade  Occupational History   Occupation: Disability    Comment: 15 years  Tobacco Use   Smoking status: Never   Smokeless tobacco: Never  Vaping Use   Vaping status: Never Used  Substance and Sexual Activity   Alcohol  use: No    Alcohol /week: 0.0 standard drinks of alcohol    Drug use: No   Sexual activity: Yes    Birth control/protection: Post-menopausal  Other Topics Concern   Not on file  Social History Narrative   Patient lives alone - 2 granddaughters live nearby - children are very involved   Patient is on disability.    Patient has 5th grade education.    Patient has 5  children, 24 grand-chilren, and 5 great-grand children.    Right handed    Social Drivers of Health   Financial Resource Strain: Low Risk  (10/09/2023)   Overall Financial Resource Strain (CARDIA)    Difficulty of Paying Living Expenses: Not hard at all  Food Insecurity: No Food Insecurity (10/09/2023)   Hunger Vital Sign    Worried About Running Out of Food in the Last Year: Never true    Ran Out of Food in the Last Year: Never true  Transportation Needs: No Transportation Needs  (10/09/2023)   PRAPARE - Administrator, Civil Service (Medical): No    Lack of Transportation (Non-Medical): No  Physical Activity: Inactive (10/09/2023)   Exercise Vital Sign    Days of Exercise per Week: 0 days    Minutes of Exercise per Session: 0 min  Stress: No Stress Concern Present (10/09/2023)   [employer] of Occupational Health - Occupational Stress Questionnaire    Feeling of Stress: Not at all  Social Connections: Moderately Integrated (10/09/2023)   Social Connection and Isolation Panel    Frequency of Communication with Friends and Family: Twice a week    Frequency of Social Gatherings with Friends and Family: Twice a week    Attends Religious Services: More than 4 times per year    Active Member of [employer] or Organizations: Yes    Attends Banker Meetings: Never    Marital Status: Divorced  Recent Concern: Social Connections - Moderately Isolated (07/26/2023)   Social Connection and Isolation Panel    Frequency of Communication with Friends and Family: More than three times a week    Frequency of Social Gatherings with Friends and Family: More than three times a week    Attends Religious Services: More than 4 times per year    Active Member of [employer] or Organizations: No    Attends Engineer, structural: Never    Marital Status: Divorced    Tobacco Counseling Counseling given: Yes    Clinical Intake:  Pre-visit preparation completed: Yes  Pain : 0-10 (back/hip) Pain Score: 8  Pain Type: Chronic pain Pain Location: Back (back/hip) Pain Orientation: Mid Pain Radiating Towards: lower back/both hips Pain Descriptors / Indicators: Aching, Constant Pain Onset: Other (comment) Pain Frequency: Constant Pain Relieving Factors: Rx pain  Pain Relieving Factors: Rx pain  BMI - recorded: 22.13 Nutritional Status: BMI of 19-24  Normal Nutritional Risks: None Diabetes: Yes CBG done?: No (104)  Lab Results  Component Value Date    HGBA1C 5.7 (H) 07/27/2023   HGBA1C 5.7 (H) 09/23/2022   HGBA1C 5.6 11/26/2021     How often do you need to have someone help you when you read instructions, pamphlets, or other written materials from your doctor or pharmacy?: 1 - Never  Interpreter Needed?: No  Information entered by :: alia t/cma   Activities of Daily Living     10/09/2023   10:24 AM 07/26/2023    6:00 PM  In your present state of health, do you have any difficulty performing the following activities:  Hearing? 0   Vision? 0   Difficulty concentrating or making decisions? 1   Walking or climbing stairs? 1   Dressing or bathing? 0   Doing errands, shopping? 0 0  Preparing Food and eating ? N   Using the Toilet? N   In the past six months, have you accidently leaked urine? N   Do you have problems with loss of bowel  control? Y   Managing your Medications? N   Managing your Finances? N   Housekeeping or managing your Housekeeping? N     Patient Care Team: Gladis Mustard, FNP as PCP - General (Nurse Practitioner) Alvan Dorn FALCON, MD as PCP - Cardiology (Cardiology) Lucilla Lynwood BRAVO, MD (Inactive) as Consulting Physician (Orthopedic Surgery) Waddell Danelle ORN, MD as Consulting Physician (Cardiology) Vernetta Lonni GRADE, MD as Consulting Physician (Orthopedic Surgery)  I have updated your Care Teams any recent Medical Services you may have received from other providers in the past year.     Assessment:   This is a routine wellness examination for [facility].  Hearing/Vision screen Hearing Screening - Comments:: Pt denies hearing dif Vision Screening - Comments:: Pt denies vision dif/pt [facility] [address] [facility] [place]/last ov was 2mos ago   Goals Addressed             This Visit's Progress    Exercise 3x per week (30 min per time)   Not on track    Increase activity level as tolerated. Aim for 30 minutes of light walking 3 times a week. Walk with a partner.   Do chair exercises daily.  Refer to handouts.      Patient Stated       Pt would like to better herself at playing the guitar       Depression Screen     10/09/2023   10:28 AM 09/11/2023    2:14 PM 03/19/2023    3:02 PM 02/17/2023    9:31 AM 02/03/2023   10:31 AM 12/22/2022    3:03 PM 12/03/2022   10:12 AM  PHQ 2/9 Scores  PHQ - 2 Score 0 0 0 0 0 0 0  PHQ- 9 Score 3 0  3 6 4 4     Fall Risk     10/09/2023   10:18 AM 09/11/2023    2:14 PM 03/19/2023    3:02 PM 02/17/2023    9:32 AM 02/03/2023   10:31 AM  Fall Risk   Falls in the past year? 1 1 1 1 1   Number falls in past yr: 1 1 1 1 1   Injury with Fall? 1 0 0 1 1  Risk for fall due to : Impaired balance/gait;Impaired mobility;Impaired vision  History of fall(s) History of fall(s);Mental status change History of fall(s);No Fall Risks  Risk for fall due to: Comment    'black out'   Follow up   Education provided  Falls evaluation completed    MEDICARE RISK AT HOME:  Medicare Risk at Home Any stairs in or around the home?: Yes If so, are there any without handrails?: Yes Home free of loose throw rugs in walkways, pet beds, electrical cords, etc?: Yes Adequate lighting in your home to reduce risk of falls?: Yes Life alert?: No Use of a cane, walker or w/c?: Yes Grab bars in the bathroom?: Yes Shower chair or bench in shower?: Yes Elevated toilet seat or a handicapped toilet?: Yes  TIMED UP AND GO:  Was the test performed?  no  Cognitive Function: 6CIT completed    06/30/2017   10:15 AM 08/20/2016    2:04 PM  MMSE - Mini Mental State Exam  Orientation to time 4 4   Orientation to Place 5 5   Registration 3 3   Attention/ Calculation 0 5   Recall 1 0   Language- name 2 objects 2 2   Language- repeat 1 1  Language- follow 3  step command 3 3   Language- read & follow direction 1 1   Write a sentence 1 1   Copy design 1 1   Total score 22 26      Data saved with a previous flowsheet row definition        10/09/2023   10:29 AM 10/08/2022     2:41 PM 09/09/2021    9:12 AM 09/04/2020   12:22 PM 08/01/2019    9:22 AM  6CIT Screen  What Year? 0 points 0 points 0 points 0 points 0 points  What month? 0 points 0 points 0 points 0 points 0 points  What time? 0 points 0 points 0 points 0 points 0 points  Count back from 20 0 points 0 points 0 points 0 points 0 points  Months in reverse 0 points 0 points 4 points 4 points   Repeat phrase 0 points 0 points 0 points 6 points 4 points  Total Score 0 points 0 points 4 points 10 points     Immunizations Immunization History  Administered Date(s) Administered   Fluad Quad(high Dose 65+) 01/15/2022   Fluad Trivalent(High Dose 65+) 12/22/2022   Influenza Split 06/11/2013   Influenza Whole 12/19/2011   Influenza,inj,Quad PF,6+ Mos 01/25/2013, 01/05/2014, 02/12/2016, 05/07/2017, 03/16/2018, 01/19/2019, 01/26/2020, 01/02/2021   Moderna Sars-Covid-2 Vaccination 10/08/2019, 06/21/2020   PNEUMOCOCCAL CONJUGATE-20 11/26/2021   Pneumococcal Conjugate-13 01/05/2014   Pneumococcal Polysaccharide-23 11/30/2011, 01/31/2012   Tdap 08/05/2011, 12/22/2022   Zoster Recombinant(Shingrix ) 07/01/2021, 11/26/2021   Zoster, Live 01/02/2012    Screening Tests Health Maintenance  Topic Date Due   MAMMOGRAM  03/18/2024 (Originally 10/03/2022)   Colonoscopy  03/18/2024 (Originally 04/26/2022)   COVID-19 Vaccine (3 - 2024-25 season) 10/24/2024 (Originally 11/30/2022)   INFLUENZA VACCINE  10/30/2023   DEXA SCAN  10/08/2024   Medicare Annual Wellness (AWV)  10/08/2024   DTaP/Tdap/Td (3 - Td or Tdap) 12/21/2032   Pneumococcal Vaccine: 50+ Years  Completed   Hepatitis C Screening  Completed   Zoster Vaccines- Shingrix   Completed   Hepatitis B Vaccines  Aged Out   HPV VACCINES  Aged Out   Meningococcal B Vaccine  Aged Out    Health Maintenance  There are no preventive care reminders to display for this patient.  Health Maintenance Items Addressed: See Nurse Notes at the end of this note  Additional  Screening:  Vision Screening: Recommended annual ophthalmology exams for early detection of glaucoma and other disorders of the eye. Would you like a referral to an eye doctor? No    Dental Screening: Recommended annual dental exams for proper oral hygiene  Community Resource Referral / Chronic Care Management: CRR required this visit?  No   CCM required this visit?  No   Plan:    I have personally reviewed and noted the following in the patient's chart:   Medical and social history Use of alcohol , tobacco or illicit drugs  Current medications and supplements including opioid prescriptions. Patient is not currently taking opioid prescriptions. Functional ability and status Nutritional status Physical activity Advanced directives List of other physicians Hospitalizations, surgeries, and ER visits in previous 12 months Vitals Screenings to include cognitive, depression, and falls Referrals and appointments  In addition, I have reviewed and discussed with patient certain preventive protocols, quality metrics, and best practice recommendations. A written personalized care plan for preventive services as well as general preventive health recommendations were provided to patient.   Ozie Ned, CMA   10/09/2023   After  Visit Summary: (MyChart) Due to this being a telephonic visit, the after visit summary with patients personalized plan was offered to patient via MyChart   Notes: Nothing significant to report at this time.

## 2023-10-16 ENCOUNTER — Other Ambulatory Visit: Payer: Self-pay | Admitting: Nurse Practitioner

## 2023-10-24 ENCOUNTER — Other Ambulatory Visit: Payer: Self-pay | Admitting: Nurse Practitioner

## 2023-10-24 DIAGNOSIS — B009 Herpesviral infection, unspecified: Secondary | ICD-10-CM

## 2023-10-28 ENCOUNTER — Other Ambulatory Visit (INDEPENDENT_AMBULATORY_CARE_PROVIDER_SITE_OTHER): Admitting: Pharmacist

## 2023-10-28 DIAGNOSIS — Z7984 Long term (current) use of oral hypoglycemic drugs: Secondary | ICD-10-CM

## 2023-10-28 DIAGNOSIS — E119 Type 2 diabetes mellitus without complications: Secondary | ICD-10-CM

## 2023-10-28 NOTE — Progress Notes (Signed)
 10/28/2023 Name: [name] MRN: [id number] DOB: 11/11/1955  Chief Complaint  Patient presents with   Diabetes    [name] is a 68 y.o. year old female who presented for a telephone visit.   They were referred to the pharmacist by their PCP for assistance in managing diabetes and hypoglycemia.    Subjective:  Patient reports she is a diabetic (A1c 6.8% in 2021), however she has been controlled on Jardiance  25mg  daily.  She is now experiencing low blood sugars in 40 -60s about 1-2 times per week.  She is symptomatic as times, but not always.  She is interested in continuous glucose monitoring to track her blood sugar.  Care Team: Primary Care Provider: Gladis Mustard, FNP    Medication Access/Adherence  Current Pharmacy:  [facility] - [place], [place] - [address] [place] [address] Phone: [phone] Fax: [phone]  [facility] [address] [place] - [address] [place] [address] Phone: [phone] Fax: [phone]  [facility] [address] [place] [address] Phone: [phone] Fax: [phone]   Patient reports affordability concerns with their medications: No  Patient reports access/transportation concerns to their pharmacy: No  Patient reports adherence concerns with their medications:  No     Diabetes: A1c was 6.8% in 2021  Current medications: Jardiance  25mg  daily Medications tried in the past: metformin   Current glucose readings: reports normal range 80-130, but also reports frequent hypoglycemia in the 40-60s (not on insulin )--happening about 1-2 times per week Has glucagon  for rescue Using accuchek GUIDE meter; testing 2-3 times daily  Patient reports hypoglycemic s/sx including dizziness, shakiness, sweating. Patient denies hyperglycemic symptoms including polyuria, polydipsia, polyphagia, nocturia, neuropathy, blurred  vision.  Current meal patterns:  Reports she does eat 3 meals daily - Breakfast: eggs, bacon, oatmeal - Supper: burger, chicken - Snacks n/a - Drinks -- water , Juice only for hypoglycemia Discussed meal planning options and Plate method for healthy eating Avoid sugary drinks and desserts Incorporate balanced protein, non starchy veggies, 1 serving of carbohydrate with each meal Increase water  intake Increase physical activity as able  Current physical activity: as able  Current medication access support: dual medicare/medicaid   Objective:  Lab Results  Component Value Date   HGBA1C 5.7 (H) 07/27/2023    Lab Results  Component Value Date   CREATININE 0.61 07/27/2023   BUN 14 07/27/2023   NA 138 07/27/2023   K 3.5 07/27/2023   CL 105 07/27/2023   CO2 22 07/27/2023    Lab Results  Component Value Date   CHOL 154 06/16/2023   HDL 69 06/16/2023   LDLCALC 71 06/16/2023   LDLDIRECT 142.1 04/22/2007   TRIG 70 06/16/2023   CHOLHDL 2.2 06/16/2023    Medications Reviewed Today     Reviewed by Billee Mliss BIRCH, [facility] (Pharmacist) on 10/28/23 at 1314  Med List Status: <None>   Medication Order Taking? Sig Documenting Provider Last Dose Status Informant  Accu-Chek Softclix Lancets lancets [id number]  test blood sugar up to 4 times a day as directed. Dx M26.96 Gladis Mustard, FNP  Active   acyclovir  ointment (ZOVIRAX ) 5 % [id number]  apply 1 application topically every 3 (three) hours as needed (outbreaks). Gladis, [name], FNP  Active   albuterol  (VENTOLIN  HFA) 108 (90 Base) MCG/ACT inhaler [id number]  inhale 2 puffs every 6 hours as needed for shortness of breath and wheezing.  Gladis, [name], FNP  Active   Alcohol  Swabs  (B-D SINGLE USE SWABS  REGULAR) PADS [id number]  USE 1 PAD DAILY WHEN CHECKING BLOOD SUGAR. R73.03 Gladis Mustard, FNP  Active Self  blood glucose meter kit and supplies [id number]  Dispense based on patient and insurance preference. Use up  to four times daily as directed. (FOR ICD-10 E10.9, E11.9). Gladis, [name], FNP  Active Self  busPIRone  (BUSPAR ) 10 MG tablet [id number]  Take 1 tablet (10 mg total) by mouth 2 (two) times daily as needed. Gladis, [name], FNP  Active   butalbital -acetaminophen -caffeine  (FIORICET ) 50-325-40 MG tablet [id number]  take 1 tablet by mouth every 6 (six) hours as needed for headache. Gladis, [name], FNP  Active   butalbital -acetaminophen -caffeine  (FIORICET ) 50-325-40 MG tablet [id number]  take 1 tablet by mouth every 6 (six) hours as needed for headache. Gladis, [name], FNP  Active   carvedilol  (COREG ) 3.125 MG tablet [id number]  TAKE 1/2 (0.5) TABLET TWICE A DAY WITH A MEAL; BLOOD PRESSURE OVER 150 BEFORE TAKING.  Patient taking differently: 3.125 mg. TAKE 1/2 (0.5) TABLET TWICE A DAY WITH A MEAL; BLOOD PRESSURE OVER 150 BEFORE TAKING. Taking PRN   Gladis Mustard, FNP  Active   celecoxib  (CELEBREX ) 200 MG capsule [id number]  Take 1 capsule (200 mg total) by mouth daily. Burnetta Brunet, DO  Active   Continuous Glucose Sensor (FREESTYLE LIBRE 3 PLUS SENSOR) [place] [id number]  Apply 1 each topically every 14 (fourteen) days. Change sensor every 15 days. Gladis, [name], FNP  Active   dexlansoprazole  (DEXILANT ) 60 MG capsule [id number]  Take 1 capsule (60 mg total) by mouth daily. Gladis, [name], FNP  Active   Dextromethorphan-GG-APAP (CORICIDIN HBP COLD/COUGH/FLU) 10-200-325 MG/15ML LIQD [id number]  Take 30 mLs by mouth every 4 (four) hours as needed (cough). Cathlene Marry Lenis, FNP  Active   diclofenac  Sodium (VOLTAREN ) 1 % GEL [id number]  APPLY 4 GRAMS TOPICALLY 4 TIMES A DAY.  Patient taking differently: Apply 4 g topically 4 (four) times daily as needed (pain).   Gladis, [name], FNP  Active Self  DULoxetine  (CYMBALTA ) 60 MG capsule [id number]  Take 1 capsule (60 mg total) by mouth at bedtime. Gladis, [name], FNP  Active   empagliflozin  (JARDIANCE ) 25 MG  TABS tablet [id number]  Take 1 tablet (25 mg total) by mouth daily. Gladis, [name], FNP  Active   escitalopram  (LEXAPRO ) 20 MG tablet [id number]  Take 1 tablet (20 mg total) by mouth daily. Gladis, [name], FNP  Active   estradiol  (ESTRACE ) 0.1 MG/GM vaginal cream [id number]  Place 1 Applicatorful vaginally 3 (three) times a week. Gladis, [name], FNP  Active   FIBER PO [id number]  Take 1 tablet by mouth in the morning and at bedtime. [provider]  Active Self  fluconazole  (DIFLUCAN ) 150 MG tablet [id number]  1 po q week x 4 weeks Gladis, [name], FNP  Active   fludrocortisone  (FLORINEF ) 0.1 MG tablet [id number]  take 1 tablet (100mcg total) by mouth every other day. Gladis, [name], FNP  Active   fluticasone  (FLONASE ) 50 MCG/ACT nasal spray [id number]  USE 1 SPRAY IN EACH NOSTRIL ONCE DAILY.  Patient taking differently: Place 1 spray into both nostrils daily as needed for allergies.   Gladis, [name], FNP  Active Self  fluticasone  furoate-vilanterol (BREO ELLIPTA ) 100-25 MCG/ACT AEPB [id number]  Inhale 1 puff by mouth once daily Gladis, [name], FNP  Active   furosemide  (LASIX ) 40 MG tablet [id number]  TAKE 1 TABLET DAILY AS NEEDED FOR EXCESSIVE FLUID. Gladis Mustard, FNP  Active  Glucagon , rDNA, (GLUCAGON  EMERGENCY) 1 MG KIT [id number]  INJECT 1 MG INTO THE SKIN AS NEEDED Gladis, [name], FNP  Active   glucose blood (ACCU-CHEK GUIDE) test strip [id number]  TEST BLOOD SUGAR UP TO 4 TIMES A DAY AS DIRECTED. R73.03 Gladis, [name], FNP  Active Self  hydrALAZINE  (APRESOLINE ) 25 MG tablet [id number]  take 1 tablet by mouth 3 times daily as needed (for severe hypertension/systolic number 170 or greater). Alvan Dorn FALCON, MD  Active   HYDROcodone -acetaminophen  (NORCO) 7.5-325 MG tablet [id number]  Take 1 tablet by mouth in the morning, at noon, and at bedtime. Gladis, [name], FNP  Active   HYDROcodone -acetaminophen  (NORCO) 7.5-325 MG  tablet [id number]  Take 1 tablet by mouth in the morning, at noon, and at bedtime. Gladis, [name], FNP  Active   ipratropium (ATROVENT ) 0.02 % nebulizer solution [id number]  USE 1 VIAL ( ) IN NEBULIZER EVERY 6 HOURS AS NEEDED FOR WHEEZING OR SHORTNESS OF BREATH. Gladis, [name], FNP  Active Self  isosorbide  mononitrate (IMDUR ) 60 MG 24 hr tablet [id number]  Take 1 tablet (60 mg total) by mouth 2 (two) times daily. Gladis, [name], FNP  Active   lamoTRIgine  (LAMICTAL ) 150 MG tablet [id number]  take 1 tablet (150 MILLIGRAM total) by mouth daily. Gladis, [name], FNP  Active   levETIRAcetam  (KEPPRA ) 500 MG tablet [id number]  TAKE (1) TABLET TWICE DAILY. Gladis, [name], FNP  Active   levocetirizine (XYZAL ) 5 MG tablet [id number]  take 1 tablet by mouth every morning. Gladis, [name], FNP  Active   lidocaine  (LIDODERM ) 5 % [id number]  place 1 patch onto the skin daily. remove & discard patch within 12 hours or as directed by md Gladis, [name], FNP  Active   lidocaine  (XYLOCAINE ) 5 % ointment [id number]  apply to affected area 3 times a day as needed for mild or moderate pain. Gladis, [name], FNP  Active   methocarbamol  (ROBAXIN ) 500 MG tablet [id number]  take 1 tablet (500 milligram total) by mouth 4 (four) times daily. Gladis, [name], FNP  Active   metoCLOPramide  (REGLAN ) 10 MG tablet [id number]  Take 1 tablet (10 mg total) by mouth every 6 (six) hours. Yolande Lamar BROCKS, MD  Active   nitroGLYCERIN  (NITROSTAT ) 0.4 MG SL tablet [id number]  place one (1) tablet under tongue every 5 minutes up to (3) doses as needed for chest pain. Gladis, [name], FNP  Active   ondansetron  (ZOFRAN ) 4 MG tablet [id number]  take 1 tablet by mouth every 8 hours as needed for nausea and vomiting. Gladis, [name], FNP  Active   oxybutynin  (DITROPAN -XL) 5 MG 24 hr tablet [id number]  Take 1 tablet (5 mg total) by mouth at bedtime. Gladis, [name], FNP  Active Self   polyethylene glycol (MIRALAX  / GLYCOLAX ) 17 g packet [id number]  Take 17 g by mouth 2 (two) times daily. Bryn Bernardino NOVAK, MD  Active   Probiotic Product (PROBIOTIC PO) [id number]  Take 1 capsule by mouth in the morning. [provider]  Active Self  promethazine -dextromethorphan (PROMETHAZINE -DM) 6.25-15 MG/5ML syrup [id number]  Take 5 mLs by mouth 3 (three) times daily as needed for cough. Lavell Lye A, FNP  Active   rosuvastatin  (CRESTOR ) 10 MG tablet [id number]  Take 1 tablet (10 mg total) by mouth daily. Gladis, [name], FNP  Active   senna-docusate (SENOKOT-S) 8.6-50 MG tablet [id number]  Take 1 tablet by mouth 2 (two) times daily. Bryn Bernardino NOVAK, MD  Active   traZODone  (DESYREL ) 150 MG tablet [id number]  Take 1 tablet (150 mg total)  by mouth at bedtime. Gladis, [name], FNP  Active   valACYclovir  (VALTREX ) 1000 MG tablet [id number]  Take 1 tablet (1,000 mg total) by mouth 3 (three) times daily as needed (with active outbreaks.). Gladis, [name], FNP  Active   valACYclovir  (VALTREX ) 500 MG tablet [id number]  take (1) tablet twice daily. Gladis, [name], FNP  Active              Assessment/Plan:   Diabetes: - Currently uncontrolled-- - Reviewed long term cardiovascular and renal outcomes of uncontrolled blood sugar - Reviewed goal A1c, goal fasting, and goal 2 hour post prandial glucose - Reviewed dietary modifications including : must eat 2- 3 healthy plate meals daily; make sure you are getting enough protein, fiber and carbohydrates - Reviewed guidelines for treating hypoglycemia - Recommend to : Start Libre 3 PLUS CGM, but will have to confirm coverage--patient does meet requirements since history of T2DM (A1c 6.8% in 2021) and having 2 or more level 2 hypoglycemic events  Will send RX for libre to [facility] via [facility]  - Recommend to check glucose daily (fasting) or if symptomatic    Follow Up Plan: next week after benefits  for CGM have been verified   Mliss Tarry Griffin, PharmD, BCACP, CPP Clinical Pharmacist, [facility]

## 2023-10-30 ENCOUNTER — Encounter: Payer: Self-pay | Admitting: Pharmacist

## 2023-10-31 DIAGNOSIS — S81811A Laceration without foreign body, right lower leg, initial encounter: Secondary | ICD-10-CM | POA: Diagnosis not present

## 2023-10-31 DIAGNOSIS — S5011XA Contusion of right forearm, initial encounter: Secondary | ICD-10-CM | POA: Diagnosis not present

## 2023-10-31 DIAGNOSIS — M545 Low back pain, unspecified: Secondary | ICD-10-CM | POA: Diagnosis not present

## 2023-10-31 DIAGNOSIS — S51811A Laceration without foreign body of right forearm, initial encounter: Secondary | ICD-10-CM | POA: Diagnosis not present

## 2023-10-31 NOTE — ED Provider Notes (Signed)
 Emergency Department Provider Note    ED Clinical Impression   Final diagnoses:  Skin tear of right forearm without complication, initial encounter (Primary)  Noninfected skin tear of right lower extremity, initial encounter  Ground-level fall  Multiple contusions    ED Assessment/Plan    Condition: Stable Disposition: Discharge  This chart has been completed using Dragon Medical Dictation software, and while attempts have been made to ensure accuracy, certain words and phrases may not be transcribed as intended.   History   Chief Complaint  Patient presents with  . Fall   HPI  [name] is a 68 y.o. female  [name] [place] Ee, with a h/o bipolar and fibromyalgia who presents today to the emergency department stating she had a mechanical fall from standing when she was trying to get a raccoon out of a dumpster in the woods.  She states she had a fall from standing in the woods, landing on her right side.  Her only complaints of pain are in her low back, right buttock and left wrist.  Patient denies any other injury and has been able to ambulate normally after her fall.  She did sustain some ecchymoses to her right volar forearm and right calf area.  Granddaughter reports that the patient did shower after the event but that they are concerned that they were not able to adequately clean her scattered very superficial skin tears.  Patient states, I know nothing was broken, I just feel banged up.    Pt denies hitting head. Pt denies LOC / HA / midline neck pain / focal rib pain / shortness of breath / abdominal pain / midline back pain / wrist or hip injury after injury.  Pt has been able to ambulate  at baseline after event.  Blood thinners: Denies  PMH: bipolar 1 disorder, chronic HFpEF, COPD, HTN, HLD, T2DM, seizure disorder, fibromyalgia, spinal stenosis and recurrent diverticulitis   - Patient is followed by orthopedics SH: Lives with granddaughter  -Patient prescribed #90 7.5 mg Norco per month by a family practice nurse prescriber-last filled 7/18.  She is also prescribed #20 Fioricet  per month   PMHx:  has a past medical history of Acute bronchitis, Anemia, Anxiety, Asthma (HHS-HCC), Bipolar 1 disorder, Borderline diabetes, Bradycardia, CAD (coronary artery disease), Cataracts, bilateral, Depression, Diabetes mellitus, Diverticulosis, Edema, Epilepsy, GERD (gastroesophageal reflux disease), Herpes, Hyperlipidemia, Hypertension, Hypotension, Insomnia, Migraines, Myocardial infarction, Overactive bladder, Seizures, Sleep apnea, Spondylosis, SVT (supraventricular tachycardia) (HHS-HCC), and Tachycardia.  PSHx:  has a past surgical history that includes Brain surgery; Neck surgery; Breast lumpectomy; Kidney surgery; and Back surgery.  SocHx:  reports that she has never smoked. She has never used smokeless tobacco. She reports that she does not drink alcohol  and does not use drugs.  Allergies: is allergic to codeine , morphine, pregabalin , metformin , gabapentin, and zolpidem  tartrate.   Medications: has a current medication list which includes the following long-term medication(s): albuterol , albuterol , carvedilol , carvedilol , duloxetine , escitalopram  oxalate, breo ellipta , fluticasone  propionate, furosemide , ipratropium, lamotrigine , levetiracetam , oxybutynin , ranitidine , rosuvastatin , tolterodine , and trazodone .  Allergies, Medications, Medical, Surgical, and Social History were reviewed as documented above.   Chart Review: Family practice nurse note reads, Patient blood sugars go up and down. Has frequent black outs. They have her checking her blood sugars frequently. Says BS are all over  the place. As high as 300 and as low as 40. Has more lows then highs.   Review Of Systems  {As per HPI    Physical Exam   BP 154/81   Pulse 77   Temp 37.6 C (99.7 F)  (Oral)   Resp 20   Wt 58.8 kg (129 lb 9.6 oz)   SpO2 98%   BMI 23.70 kg/m   Body mass index is 23.7 kg/m.  Physical Exam:  Constitutional: Vital signs reviewed, Well appearing, Patient appears comfortable, There is no acute distress, Not ill or toxic appearing.  + Chatting with granddaughter at bedside Head: Atraumatic, no abrasion / laceration / hematoma /  signs of trauma. Eye:  No entrapment. Atraumatic. ENT:   No facial or nasal bone trauma. Neck:  no C-spine TTP. Normal ROM, atraumatic. Respiratory / Chest:  no chest wall TTP. No mobile rib / crepitus . No respiratory distress, No rales / rhonchi / wheezing. Breath sounds clear and equal bilaterally. CV: Normal Rate/Rhythm, No murmurs appreciated. HR 70 Abdomen: Soft. Nontender, Bowel sounds normal, No peritoneal signs. No guarding / distension. {Pelvis stable, nontender.  Back: Normal inspection and ROM.  No midline TTP.  + Mild soft tissue TTP over right lumbar paraspinous muscles only, moves easily in bed without apparent discomfort or limitation Upper Extremity: Atraumatic except very superficial, approximately 2 cm skin tear with surrounding nontender ecchymosis noted along the distal volar forearm. No active bleeding or contamination. Normal painless full range of motion of all joints in bilateral upper extremity. Warm, WP.  No bony TTP/deformity/effusion. Lower Extremity: Atraumat except a few scattered ecchymoses noted along the proximal lateral   right calf and distal medial right calf.  Both areas have very superficial, approximately 1 cm skin tears.  No active bleeding or contamination.  Warm, WP. Normal painless full range of motion of all joints in bilateral upper extremity. Warm, WP.  No bony TTP/deformity/effusion. Neuro: GCS 15, Speech normal, Normal mental status, MAEW equally. Skin: Skin is warm, dry, normal color,  no lacerations. Psych: Normal affect, Alert, oriented.    ED Course   Procedures      MEDICAL  DECISION MAKING AND PLAN OF CARE   6:33 PM exam and HPI c/w few scattered ecchymoses and very superficial skin tears over right volar forearm and right calf w/no signs of serious injury on full exam. Pt has no visible indicators of pain or distress, no limitation in movement of neck, back, or extremities.  Granddaughter states that she is very concerned that despite the fact that the patient showered prior to arrival that the wounds have not been adequately cleaned.  Verbal anesthesia provided that there are no signs of contamination or any type of even scant debris noted anywhere on the patient's body.  However, I will ask the nurse to clean these areas gently with a wet washcloth and use a light gauze dressing over the few areas of skin tear.  Granddaughter states, her skin is like paper, and states that she cannot use bandages due to to the adherent nature.  Medical screening exam was completed.  No emergency medical condition was identified.  Plan wound care and close f/u PMD if not better in 3-5 days, sooner if worse.     Extensive d/w pt and granddaughter   -  Pt understands dx   and agrees with plan as above, close outpt f/u , agrees to return if any signs or symptoms of worsening or if not improving  as expected.   Pt ambulates easily and without limitation on discharge.  After careful consideration of the patient's presentation and clinical course, at this time there does not appear to be an indication for further emergent evaluation or intervention, nor is there an indication for admission to the hospital.  At the time of discharge, I do not think, to the best of my medical judgment, that an emergent medical condition exists, and the patient is discharged home well-appearing, in stable and satisfactory condition.  Discharge diagnosis, instructions and plan were discussed and understood.  At the time of discharge the patient appeared comfortable and was in no apparent distress.  Extensive  verbal and written care and strict return instructions were given by me. The patient was instructed that if their symptoms worsened or if they have any additional concerns that they should return to the emergency room immediately.   MEDICAL DECISION MAKING   IMCJ, LOW SUSPICION FOR SERIOUS INTRACRANIAL / SKULL / CSPINE / FACIAL BONE / OCULAR / CHEST WALL / INTRA-THORACIC / INTRA-ABDOMINAL / PELVIS / EXTREMITY / VERTEBRAL / SPINAL CORD INJURY. LOW SUSPICION FOR FOCAL NEURO DEFICIT / CORD CONTUSION / SYNCOPE / CONCUSSION     Notes: Prior to seeing the patient, the triage note and nursing notes were reviewed, and I agree, except for where my documentation differs.  Old records obtained, reviewed, and summarized in PMH (unless unavailable). All laboratory values, imaging studies, and EKG's were personally reviewed by me (when ordered). Additional history obtained from: Granddaughter  Evonnie Redder, MD Emergency Attending Physician    ED Results No results found for any visits on 10/31/23. No results found.  Medications Administered: Medications - No data to display  Discharge Medications (Medications Prescribed during this  ED visit and Patient's Home Medications) :    Your Medication List     ASK your doctor about these medications    acyclovir  5 % ointment Commonly known as: ZOVIRAX  APPLY TO AFFECTED AREA EVERY 3 HOURS   albuterol  90 mcg/actuation inhaler Commonly known as: PROVENTIL  HFA;VENTOLIN  HFA INHALE 2 PUFFS EVERY 6 HOURS AS NEEDED FOR SHORTNESS OF BREATH AND WHEEZING.   albuterol  2.5 mg /3 mL (0.083 %) nebulizer solution USE 1 VIAL IN NEBULIZER EVERY 4 HOURS AS NEEDED FOR WHEEZING.   ALCOHOL  PADS Padm Generic drug: alcohol  swabs  USE 1 PAD DAILY WHEN CHECKING BLOOD SUGAR.   ALPRAZolam  0.5 MG tablet Commonly known as: XANAX  Take 2 tablets approximately 45 minutes prior to the MRI study, take a third tablet if needed.   BREO ELLIPTA  200-25 mcg/dose  inhaler Generic drug: fluticasone  furoate-vilanterol INHALE 1 PUFF DAILY.   busPIRone  10 MG tablet Commonly known as: BUSPAR  1 po bid prn   carvedilol  3.125 MG tablet Commonly known as: COREG  Take 3.125 mg by mouth Two (2) times a day.   carvedilol  6.25 MG tablet Commonly known as: COREG    codeine -guaiFENesin  10-100 mg/5 mL liquid Commonly known as: guaiFENesin  AC Take 5 mL by mouth Three (3) times a day as needed for cough.   DEXILANT  60 mg capsule Generic drug: dexlansoprazole  TAKE 1 CAPSULE DAILY.   diazePAM  5 MG tablet Commonly known as: VALIUM  1 po bid   DULoxetine  60 MG capsule Commonly known as: CYMBALTA  Take 60 mg by mouth nightly.   escitalopram  oxalate 20 MG tablet Commonly known as: LEXAPRO  TAKE (1) TABLET BY MOUTH ONCE DAILY.   estradiol  0.01 % (0.1 mg/gram) vaginal cream Commonly known as: ESTRACE  Insert 1 Applicatorful into the vagina nightly.  fludrocortisone  0.1 mg tablet Commonly known as: FLORINEF  TAKE 2 TABLETS BY MOUTH ONCE DAILY.   fluticasone  propionate 50 mcg/actuation nasal spray Commonly known as: FLONASE  2 sprays into each nostril.   fluticasone -umeclidin-vilanter 100-62.5-25 mcg inhaler Commonly known as: TRELEGY ELLIPTA  Inhale 1 puff daily.   furosemide  40 MG tablet Commonly known as: LASIX  TAKE 1 TABLET AS NEEDED FOR SWELLING   HYDROcodone -acetaminophen  7.5-325 mg per tablet Commonly known as: NORCO Take 2 tablets by mouth daily.   ipratropium 0.02 % nebulizer solution Commonly known as: ATROVENT  USE 1 VIAL IN NEBULIZER EVERY 4 HOURS AS NEEDED FOR WHEEZING.   isosorbide  mononitrate 30 MG 24 hr tablet Commonly known as: IMDUR  Take 30 mg by mouth daily.   lamoTRIgine  150 MG tablet Commonly known as: LaMICtal  TAKE 1 TABLET BY MOUTH ONCE DAILY.   levETIRAcetam  500 MG tablet Commonly known as: KEPPRA  TAKE (1) TABLET TWICE DAILY.   levocetirizine 5 MG tablet Commonly known as: XYZAL    lidocaine  5 %  ointment Commonly known as: XYLOCAINE    nitroglycerin  0.4 MG SL tablet Commonly known as: NITROSTAT  PLACE ONE (1) TABLET UNDER TONGUE EVERY 5 MINUTES UP TO (3) DOSES AS NEEDED FOR CHEST PAIN.   ondansetron  4 MG tablet Commonly known as: ZOFRAN  TAKE 1 TO 2 TABLETS AS NEEDED FOR NAUSEA.   ONETOUCH DELICA LANCETS 33 gauge Misc Generic drug: lancets TEST BLOOD SUGAR ONCE DAILY.   ONETOUCH VERIO TEST STRIPS Strp Generic drug: blood sugar diagnostic Test blood sugar daily   oxybutynin  5 MG tablet Commonly known as: DITROPAN  TAKE (1) TABLET TWICE DAILY.   predniSONE  10 MG tablet Commonly known as: DELTASONE  Take 4 po qd x 2d then 3 po qd x 2d then 2 po qd x 2d then 1 po qd x 2d then stop   ranitidine  150 MG capsule Commonly known as: ZANTAC  Take 150 mg by mouth nightly.   rosuvastatin  10 MG tablet Commonly known as: CRESTOR  TAKE (1) TABLET BY MOUTH ONCE DAILY.   sucralfate  100 mg/mL suspension Commonly known as: CARAFATE  Take by mouth nightly.   tizanidine  4 MG capsule Commonly known as: ZANAFLEX  Take 4 mg by mouth daily as needed for muscle spasms.   tolterodine  2 MG tablet Commonly known as: DETROL  Take by mouth daily.   traZODone  150 MG tablet Commonly known as: DESYREL  TAKE 1 TABLET BY MOUTH AT BEDTIME.   valACYclovir  500 MG tablet Commonly known as: VALTREX  TAKE (1) TABLET TWICE DAILY.          Katharina Evonnie SAILOR, MD 11/14/23 [phone]

## 2023-11-04 ENCOUNTER — Other Ambulatory Visit: Payer: Self-pay | Admitting: Nurse Practitioner

## 2023-11-04 DIAGNOSIS — B009 Herpesviral infection, unspecified: Secondary | ICD-10-CM

## 2023-11-04 DIAGNOSIS — E11649 Type 2 diabetes mellitus with hypoglycemia without coma: Secondary | ICD-10-CM | POA: Diagnosis not present

## 2023-11-05 ENCOUNTER — Ambulatory Visit: Payer: Self-pay
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
## 2023-11-05 NOTE — Telephone Encounter (Signed)
 Pt has appt

## 2023-11-06 ENCOUNTER — Encounter: Payer: Self-pay | Admitting: Family Medicine

## 2023-11-06 ENCOUNTER — Ambulatory Visit (INDEPENDENT_AMBULATORY_CARE_PROVIDER_SITE_OTHER): Admitting: Family Medicine

## 2023-11-06 VITALS — BP 140/93 | HR 59 | Temp 98.7°F | Ht 62.0 in | Wt 128.0 lb

## 2023-11-06 DIAGNOSIS — L089 Local infection of the skin and subcutaneous tissue, unspecified: Secondary | ICD-10-CM | POA: Diagnosis not present

## 2023-11-06 DIAGNOSIS — S51811A Laceration without foreign body of right forearm, initial encounter: Secondary | ICD-10-CM

## 2023-11-06 DIAGNOSIS — S81811A Laceration without foreign body, right lower leg, initial encounter: Secondary | ICD-10-CM | POA: Diagnosis not present

## 2023-11-06 MED ORDER — DOXYCYCLINE HYCLATE 100 MG PO TABS: 100.0000 mg | ORAL_TABLET | Freq: Two times a day (BID) | ORAL | 0 refills | Status: AC

## 2023-11-06 NOTE — Patient Instructions (Signed)

## 2023-11-06 NOTE — Progress Notes (Signed)
 Subjective: CC: Arm injury PCP: Gladis Mustard, FNP [name] is a 68 y.o. female presenting to clinic today for:  1.  Arm injury Patient reports Saturday she was in the woods and fell and injured her arm and leg on the right side.  She has been doing wound care with daily washes, application of Neosporin and keeping it wrapped for most of the day with occasional airing out time.  She does not report any fevers but now she is starting to have some purulent discharges on her bandage and that is what brings her in today.   ROS: Per HPI  Allergies  Allergen Reactions   Codeine  Other (See Comments)    I will have a heart attack.   Morphine And Codeine  Other (See Comments)    It will cause me to have a heart attack.   Ambien  [Zolpidem  Tartrate] Nausea And Vomiting   Clonidine  Derivatives Nausea And Vomiting    GERD - caused acid reflux per pt   Metformin  And Related Nausea And Vomiting    Cramping from metformin    Lyrica  [Pregabalin ] Swelling and Other (See Comments)    Weight gain   Neurontin [Gabapentin] Other (See Comments)    Causes elevated LFTs    Past Medical History:  Diagnosis Date   Allergy    Anemia    Anginal pain (HCC)    last time    Anxiety    Arthritis    RHEUMATOID   Asthma    Bipolar 1 disorder (HCC)    Blood transfusion without reported diagnosis    x 3   Cataracts, bilateral 07/2017   CHF (congestive heart failure) (HCC)    COPD (chronic obstructive pulmonary disease) (HCC)    Coronary artery disease    reported hx of MI;  Echo 2009 with normal LVF;  Myoview  05/2011: no ischemia   Depression    Diabetes mellitus without complication (HCC)    Dyslipidemia    Dysrhythmia    SVT   Elevated liver enzymes 06/30/2022   Esophageal stricture    Fibromyalgia    GERD (gastroesophageal reflux disease)    H/O hiatal hernia    Head injury, unspecified    Headache    migraines   Herpes simplex infection    History of kidney  stones    History of loop recorder 08/10/2017   Managed by Dr. Cathlyn Birmingham   Hyperlipidemia    Hypertension    Insomnia    Myocardial infarction [facility])    age 26   Osteoporosis    Pneumonia    hx   Seizures (HCC)    none in the last 3-4 years as of 11/03/22 per patient   Shortness of breath    Sleep apnea    mild, does not require positive pressure treatment. (02/03/22)   Spinal stenosis of lumbar region    Spondylolisthesis    Status post placement of implantable loop recorder    Supraventricular tachycardia (HCC)    Syncope and collapse    s/p ILR; no arhythmogenic cause identified   UTI (lower urinary tract infection)     Current Outpatient Medications:    Accu-Chek Softclix Lancets lancets, test blood sugar up to 4 times a day as directed. Dx R73.03, Disp: 400 each, Rfl: 3   acyclovir  ointment (ZOVIRAX ) 5 %, apply 1 application topically every 3 (three) hours as needed (outbreaks)., Disp: 30 g, Rfl: 0   albuterol  (VENTOLIN  HFA) 108 (90 Base) MCG/ACT inhaler, inhale 2  puffs every 6 hours as needed for shortness of breath and wheezing., Disp: 6.7 g, Rfl: 2   Alcohol  Swabs  (B-D SINGLE USE SWABS  REGULAR) PADS, USE 1 PAD DAILY WHEN CHECKING BLOOD SUGAR. R73.03, Disp: 100 each, Rfl: 3   blood glucose meter kit and supplies, Dispense based on patient and insurance preference. Use up to four times daily as directed. (FOR ICD-10 E10.9, E11.9)., Disp: 1 each, Rfl: 0   busPIRone  (BUSPAR ) 10 MG tablet, Take 1 tablet (10 mg total) by mouth 2 (two) times daily as needed., Disp: 60 tablet, Rfl: 5   butalbital -acetaminophen -caffeine  (FIORICET ) 50-325-40 MG tablet, take 1 tablet by mouth every 6 (six) hours as needed for headache., Disp: 20 tablet, Rfl: 0   butalbital -acetaminophen -caffeine  (FIORICET ) 50-325-40 MG tablet, take 1 tablet by mouth every 6 (six) hours as needed for headache., Disp: 20 tablet, Rfl: 0   carvedilol  (COREG ) 3.125 MG tablet, TAKE 1/2 (0.5) TABLET TWICE A DAY WITH A MEAL;  BLOOD PRESSURE OVER 150 BEFORE TAKING. (Patient taking differently: 3.125 mg. TAKE 1/2 (0.5) TABLET TWICE A DAY WITH A MEAL; BLOOD PRESSURE OVER 150 BEFORE TAKING. Taking PRN), Disp: 90 tablet, Rfl: 1   celecoxib  (CELEBREX ) 200 MG capsule, Take 1 capsule (200 mg total) by mouth daily., Disp: 60 capsule, Rfl: 1   Continuous Glucose Sensor (FREESTYLE LIBRE 3 PLUS SENSOR) MISC, Apply 1 each topically every 14 (fourteen) days. Change sensor every 15 days., Disp: 2 each, Rfl: 5   dexlansoprazole  (DEXILANT ) 60 MG capsule, Take 1 capsule (60 mg total) by mouth daily., Disp: 90 capsule, Rfl: 1   Dextromethorphan-GG-APAP (CORICIDIN HBP COLD/COUGH/FLU) 10-200-325 MG/15ML LIQD, Take 30 mLs by mouth every 4 (four) hours as needed (cough)., Disp: 355 mL, Rfl: 0   diclofenac  Sodium (VOLTAREN ) 1 % GEL, APPLY 4 GRAMS TOPICALLY 4 TIMES A DAY. (Patient taking differently: Apply 4 g topically 4 (four) times daily as needed (pain).), Disp: 100 g, Rfl: 0   DULoxetine  (CYMBALTA ) 60 MG capsule, Take 1 capsule (60 mg total) by mouth at bedtime., Disp: 90 capsule, Rfl: 1   empagliflozin  (JARDIANCE ) 25 MG TABS tablet, Take 1 tablet (25 mg total) by mouth daily., Disp: 90 tablet, Rfl: 1   escitalopram  (LEXAPRO ) 20 MG tablet, Take 1 tablet (20 mg total) by mouth daily., Disp: 90 tablet, Rfl: 1   estradiol  (ESTRACE ) 0.1 MG/GM vaginal cream, Place 1 Applicatorful vaginally 3 (three) times a week., Disp: 42.5 g, Rfl: 3   FIBER PO, Take 1 tablet by mouth in the morning and at bedtime., Disp: , Rfl:    fluconazole  (DIFLUCAN ) 150 MG tablet, 1 po q week x 4 weeks, Disp: 4 tablet, Rfl: 0   fludrocortisone  (FLORINEF ) 0.1 MG tablet, take 1 tablet (100mcg total) by mouth every other day., Disp: 15 tablet, Rfl: 1   fluticasone  (FLONASE ) 50 MCG/ACT nasal spray, USE 1 SPRAY IN EACH NOSTRIL ONCE DAILY. (Patient taking differently: Place 1 spray into both nostrils daily as needed for allergies.), Disp: 16 g, Rfl: 5   fluticasone   furoate-vilanterol (BREO ELLIPTA ) 100-25 MCG/ACT AEPB, Inhale 1 puff by mouth once daily, Disp: 60 each, Rfl: 1   furosemide  (LASIX ) 40 MG tablet, TAKE 1 TABLET DAILY AS NEEDED FOR EXCESSIVE FLUID., Disp: 90 tablet, Rfl: 1   Glucagon , rDNA, (GLUCAGON  EMERGENCY) 1 MG KIT, INJECT 1 MG INTO THE SKIN AS NEEDED, Disp: 1 kit, Rfl: 1   glucose blood (ACCU-CHEK GUIDE) test strip, TEST BLOOD SUGAR UP TO 4 TIMES A DAY AS DIRECTED. R73.03,  Disp: 400 each, Rfl: 3   hydrALAZINE  (APRESOLINE ) 25 MG tablet, take 1 tablet by mouth 3 times daily as needed (for severe hypertension/systolic number 170 or greater)., Disp: 90 tablet, Rfl: 3   [START ON 11/12/2023] HYDROcodone -acetaminophen  (NORCO) 7.5-325 MG tablet, Take 1 tablet by mouth in the morning, at noon, and at bedtime., Disp: 90 tablet, Rfl: 0   HYDROcodone -acetaminophen  (NORCO) 7.5-325 MG tablet, Take 1 tablet by mouth in the morning, at noon, and at bedtime., Disp: 90 tablet, Rfl: 0   ipratropium (ATROVENT ) 0.02 % nebulizer solution, USE 1 VIAL ( ) IN NEBULIZER EVERY 6 HOURS AS NEEDED FOR WHEEZING OR SHORTNESS OF BREATH., Disp: 150 mL, Rfl: 2   isosorbide  mononitrate (IMDUR ) 60 MG 24 hr tablet, Take 1 tablet (60 mg total) by mouth 2 (two) times daily., Disp: 60 tablet, Rfl: 6   lamoTRIgine  (LAMICTAL ) 150 MG tablet, take 1 tablet (150 MILLIGRAM total) by mouth daily., Disp: 90 tablet, Rfl: 0   levETIRAcetam  (KEPPRA ) 500 MG tablet, TAKE (1) TABLET TWICE DAILY., Disp: 60 tablet, Rfl: 2   levocetirizine (XYZAL ) 5 MG tablet, take 1 tablet by mouth every morning., Disp: 30 tablet, Rfl: 5   lidocaine  (LIDODERM ) 5 %, place 1 patch onto the skin daily. remove & discard patch within 12 hours or as directed by md, Disp: 10 patch, Rfl: 2   lidocaine  (XYLOCAINE ) 5 % ointment, apply to affected area 3 times a day as needed for mild or moderate pain., Disp: 240 g, Rfl: 0   methocarbamol  (ROBAXIN ) 500 MG tablet, take 1 tablet (500 milligram total) by mouth 4 (four) times  daily., Disp: 30 tablet, Rfl: 0   metoCLOPramide  (REGLAN ) 10 MG tablet, Take 1 tablet (10 mg total) by mouth every 6 (six) hours., Disp: 15 tablet, Rfl: 0   nitroGLYCERIN  (NITROSTAT ) 0.4 MG SL tablet, place one (1) tablet under tongue every 5 minutes up to (3) doses as needed for chest pain., Disp: 25 tablet, Rfl: 1   ondansetron  (ZOFRAN ) 4 MG tablet, take 1 tablet by mouth every 8 hours as needed for nausea and vomiting., Disp: 20 tablet, Rfl: 0   oxybutynin  (DITROPAN -XL) 5 MG 24 hr tablet, Take 1 tablet (5 mg total) by mouth at bedtime., Disp: 90 tablet, Rfl: 1   polyethylene glycol (MIRALAX  / GLYCOLAX ) 17 g packet, Take 17 g by mouth 2 (two) times daily., Disp: 14 each, Rfl: 0   Probiotic Product (PROBIOTIC PO), Take 1 capsule by mouth in the morning., Disp: , Rfl:    promethazine -dextromethorphan (PROMETHAZINE -DM) 6.25-15 MG/5ML syrup, Take 5 mLs by mouth 3 (three) times daily as needed for cough., Disp: 118 mL, Rfl: 0   rosuvastatin  (CRESTOR ) 10 MG tablet, Take 1 tablet (10 mg total) by mouth daily., Disp: 90 tablet, Rfl: 1   senna-docusate (SENOKOT-S) 8.6-50 MG tablet, Take 1 tablet by mouth 2 (two) times daily., Disp: 60 tablet, Rfl: 0   traZODone  (DESYREL ) 150 MG tablet, Take 1 tablet (150 mg total) by mouth at bedtime., Disp: 90 tablet, Rfl: 1   valACYclovir  (VALTREX ) 1000 MG tablet, take 1 tablet (1,000 MILLIGRAM total) by mouth 3 (three) times daily as needed (with active outbreaks.)., Disp: 21 tablet, Rfl: 1   valACYclovir  (VALTREX ) 500 MG tablet, take (1) tablet twice daily., Disp: 180 tablet, Rfl: 0 Social History   Socioeconomic History   Marital status: Single    Spouse name: Not on file   Number of children: 5   Years of education: Not on file  Highest education level: 5th grade  Occupational History   Occupation: Disability    Comment: 15 years  Tobacco Use   Smoking status: Never   Smokeless tobacco: Never  Vaping Use   Vaping status: Never Used  Substance and Sexual  Activity   Alcohol  use: No    Alcohol /week: 0.0 standard drinks of alcohol    Drug use: No   Sexual activity: Yes    Birth control/protection: Post-menopausal  Other Topics Concern   Not on file  Social History Narrative   Patient lives alone - 2 granddaughters live nearby - children are very involved   Patient is on disability.    Patient has 5th grade education.    Patient has 5 children, 24 grand-chilren, and 5 great-grand children.    Right handed    Social Drivers of Health   Financial Resource Strain: Low Risk  (10/09/2023)   Overall Financial Resource Strain (CARDIA)    Difficulty of Paying Living Expenses: Not hard at all  Food Insecurity: No Food Insecurity (10/09/2023)   Hunger Vital Sign    Worried About Running Out of Food in the Last Year: Never true    Ran Out of Food in the Last Year: Never true  Transportation Needs: No Transportation Needs (10/09/2023)   PRAPARE - Administrator, Civil Service (Medical): No    Lack of Transportation (Non-Medical): No  Physical Activity: Inactive (10/09/2023)   Exercise Vital Sign    Days of Exercise per Week: 0 days    Minutes of Exercise per Session: 0 min  Stress: No Stress Concern Present (10/09/2023)   [name]    Feeling of Stress: Not at all  Social Connections: Moderately Integrated (10/09/2023)   Social Connection and Isolation Panel    Frequency of Communication with Friends and Family: Twice a week    Frequency of Social Gatherings with Friends and Family: Twice a week    Attends Religious Services: More than 4 times per year    Active Member of [employer] or Organizations: Yes    Attends Banker Meetings: Never    Marital Status: Divorced  Recent Concern: Social Connections - Moderately Isolated (07/26/2023)   Social Connection and Isolation Panel    Frequency of Communication with Friends and Family: More than three times a week     Frequency of Social Gatherings with Friends and Family: More than three times a week    Attends Religious Services: More than 4 times per year    Active Member of [employer] or Organizations: No    Attends Banker Meetings: Never    Marital Status: Divorced  Catering manager Violence: Not At Risk (10/09/2023)   Humiliation, Afraid, Rape, and Kick [name]    Fear of Current or Ex-Partner: No    Emotionally Abused: No    Physically Abused: No    Sexually Abused: No   Family History  Problem Relation Age of Onset   Heart attack Father    Mental illness Father    Mental illness Mother    Heart attack Brother        stents   Alcohol  abuse Brother    Heart disease Brother    Drug abuse Brother    Diabetes Brother    Colon cancer Maternal Aunt    Cirrhosis Brother    Stomach cancer Neg Hx    Esophageal cancer Neg Hx    Rectal cancer Neg Hx  Objective: Office vital signs reviewed. BP (!) 140/93   Pulse (!) 59   Temp 98.7 F (37.1 C)   Ht 5' 2 (1.575 m)   Wt 128 lb (58.1 kg)   SpO2 93%   BMI 23.41 kg/m   Physical Examination:  General: Awake, alert, well nourished, No acute distress MSK: Has full active range of motion of the right forearm and wrist.  She does have a skin tear along the ventral aspect of the right forearm that has some purulent material on her bandage.  There is some soft tissue swelling in the area as well as ecchymosis.  She has a tear on the right calf laterally and medially, both again with some purulent material on the bandage.  These are all hemostatic.  She is ambulating independently  Assessment/ Plan: 68 y.o. female   Infected skin tear - Plan: doxycycline  (VIBRA -TABS) 100 MG tablet  ISTAP type 1 skin tear of right forearm - Plan: doxycycline  (VIBRA -TABS) 100 MG tablet  ISTAP type 1 skin tear of right lower leg - Plan: doxycycline  (VIBRA -TABS) 100 MG tablet  She is up-to-date on tetanus shot as of 2024.  Doxycycline  sent for  purulent materials noted today.  She did have a slight area of soft tissue swelling near the forearm tear.  Continue wound care.  Follow-up as needed   Norene CHRISTELLA Fielding, DO [facility] [phone]

## 2023-11-23 ENCOUNTER — Encounter (HOSPITAL_BASED_OUTPATIENT_CLINIC_OR_DEPARTMENT_OTHER)
Admission: RE | Admit: 2023-11-23 | Discharge: 2023-11-23 | Disposition: A | Discharge status: Home / Self Care | Source: Ambulatory Visit | Attending: Orthopedic Surgery

## 2023-11-23 ENCOUNTER — Telehealth: Payer: Self-pay

## 2023-11-23 DIAGNOSIS — Z01818 Encounter for other preprocedural examination: Secondary | ICD-10-CM | POA: Insufficient documentation

## 2023-11-23 LAB — BASIC METABOLIC PANEL WITH GFR
Anion gap: 10 (ref 5–15)
BUN: 16 mg/dL (ref 8–23)
CO2: 21 mmol/L — ABNORMAL LOW (ref 22–32)
Calcium: 9.5 mg/dL (ref 8.9–10.3)
Chloride: 107 mmol/L (ref 98–111)
Creatinine, Ser: 0.69 mg/dL (ref 0.44–1.00)
GFR, Estimated: >60 mL/min (ref >60)
Glucose, Bld: 93 mg/dL (ref 70–99)
Potassium: 5.9 mmol/L — ABNORMAL HIGH (ref 3.5–5.1)
Sodium: 138 mmol/L (ref 135–145)

## 2023-11-23 NOTE — Telephone Encounter (Signed)
  Patient Consent for Virtual Visit         [name] has provided verbal consent on 11/23/2023 for a virtual visit (video or telephone).   Appointment scheduled for Wednesday, November 24, 2023 @ 10:40am. Call [phone]. Med req and consent are complete.    CONSENT FOR VIRTUAL VISIT FOR:  [name]  By participating in this virtual visit I agree to the following:  I hereby voluntarily request, consent and authorize [facility] and its employed or contracted physicians, physician assistants, nurse practitioners or other licensed health care professionals (the Practitioner), to provide me with telemedicine health care services (the "Services) as deemed necessary by the treating Practitioner. I acknowledge and consent to receive the Services by the Practitioner via telemedicine. I understand that the telemedicine visit will involve communicating with the Practitioner through live audiovisual communication technology and the disclosure of certain medical information by electronic transmission. I acknowledge that I have been given the opportunity to request an in-person assessment or other available alternative prior to the telemedicine visit and am voluntarily participating in the telemedicine visit.  I understand that I have the right to withhold or withdraw my consent to the use of telemedicine in the course of my care at any time, without affecting my right to future care or treatment, and that the Practitioner or I may terminate the telemedicine visit at any time. I understand that I have the right to inspect all information obtained and/or recorded in the course of the telemedicine visit and may receive copies of available information for a reasonable fee.  I understand that some of the potential risks of receiving the Services via telemedicine include:  Delay or interruption in medical evaluation due to technological equipment failure or disruption; Information  transmitted may not be sufficient (e.g. poor resolution of images) to allow for appropriate medical decision making by the Practitioner; and/or  In rare instances, security protocols could fail, causing a breach of personal health information.  Furthermore, I acknowledge that it is my responsibility to provide information about my medical history, conditions and care that is complete and accurate to the best of my ability. I acknowledge that Practitioner's advice, recommendations, and/or decision may be based on factors not within their control, such as incomplete or inaccurate data provided by me or distortions of diagnostic images or specimens that may result from electronic transmissions. I understand that the practice of medicine is not an exact science and that Practitioner makes no warranties or guarantees regarding treatment outcomes. I acknowledge that a copy of this consent can be made available to me via my patient portal [facility] MyChart), or I can request a printed copy by calling the office of [facility].    I understand that my insurance will be billed for this visit.   I have read or had this consent read to me. I understand the contents of this consent, which adequately explains the benefits and risks of the Services being provided via telemedicine.  I have been provided ample opportunity to ask questions regarding this consent and the Services and have had my questions answered to my satisfaction. I give my informed consent for the services to be provided through the use of telemedicine in my medical care

## 2023-11-23 NOTE — Telephone Encounter (Signed)
   Pre-operative Risk Assessment    Patient Name: [name]  DOB: May 11, 1955 MRN: [id number]   Date of last office visit: 08/27/23 ALMARIE CRATE, NP Date of next office visit: NONE   Request for Surgical Clearance    Procedure:  LEFT THUMB [facility] ARTHROPLASTY WITH INTERNAL BRACE AND MCP CAPSULODESIS PINNING  Date of Surgery:  Clearance 11/27/23                                Surgeon:  NOT INDICATED  Surgeon's Group or Practice Name:  [facility] [place] Phone number:  [phone] Fax number:  [phone]  ATTN: APRIL   Type of Clearance Requested:   - Medical    Type of Anesthesia:  REGIONAL BLOCK   Additional requests/questions:    SignedLucie DELENA Ku   11/23/2023, 2:29 PM

## 2023-11-23 NOTE — Telephone Encounter (Signed)
 Appointment scheduled for Wednesday, November 24, 2023 @ 10:40am. (Patient preferred morning appt, used provider slot - approved by EMERSON Bane, NP) Call [phone]. Med req and consent are complete.

## 2023-11-23 NOTE — Telephone Encounter (Signed)
 Primary Cardiologist:Branch, Dorn, MD   Preoperative team, please contact this patient and set up a phone call appointment for further preoperative risk assessment. Please obtain consent and complete medication review. Thank you for your help.   I confirm that guidance regarding antiplatelet and oral anticoagulation therapy has been completed and, if necessary, noted below (none requested).  I also confirmed the patient resides in the state of [place] . As per [facility] Medical Board telemedicine laws, the patient must reside in the state in which the provider is licensed.   Rosaline EMERSON Bane, NP-C  11/23/2023, 2:51 PM [address], [address] [place], [place] [address] Office [phone] Fax [phone]

## 2023-11-24 ENCOUNTER — Other Ambulatory Visit: Payer: Self-pay

## 2023-11-24 ENCOUNTER — Telehealth: Payer: Self-pay | Admitting: Nurse Practitioner

## 2023-11-24 ENCOUNTER — Encounter: Payer: Self-pay | Admitting: Nurse Practitioner

## 2023-11-24 ENCOUNTER — Other Ambulatory Visit: Payer: Self-pay | Admitting: Family Medicine

## 2023-11-24 ENCOUNTER — Ambulatory Visit: Attending: Cardiology | Admitting: Nurse Practitioner

## 2023-11-24 DIAGNOSIS — R7303 Prediabetes: Secondary | ICD-10-CM

## 2023-11-24 DIAGNOSIS — M1812 Unilateral primary osteoarthritis of first carpometacarpal joint, left hand: Secondary | ICD-10-CM

## 2023-11-24 DIAGNOSIS — Z01818 Encounter for other preprocedural examination: Secondary | ICD-10-CM | POA: Diagnosis not present

## 2023-11-24 DIAGNOSIS — Z01810 Encounter for preprocedural cardiovascular examination: Secondary | ICD-10-CM

## 2023-11-24 LAB — BASIC METABOLIC PANEL WITH GFR
Anion gap: 11 (ref 5–15)
BUN: 17 mg/dL (ref 8–23)
CO2: 26 mmol/L (ref 22–32)
Calcium: 9.1 mg/dL (ref 8.9–10.3)
Chloride: 105 mmol/L (ref 98–111)
Creatinine, Ser: 0.71 mg/dL (ref 0.44–1.00)
GFR, Estimated: >60 mL/min (ref >60)
Glucose, Bld: 79 mg/dL (ref 70–99)
Potassium: 4.5 mmol/L (ref 3.5–5.1)
Sodium: 142 mmol/L (ref 135–145)

## 2023-11-24 NOTE — Progress Notes (Addendum)
 Virtual Visit via Telephone Note   Because of [name] co-morbid illnesses, she is at least at moderate risk for complications without adequate follow up.  This format is felt to be most appropriate for this patient at this time.  Due to technical limitations with video connection Web designer), today's appointment will be conducted as an audio only telehealth visit, and [name] verbally agreed to proceed in this manner.   All issues noted in this document were discussed and addressed.  No physical exam could be performed with this format.  Evaluation Performed:  Preoperative cardiovascular risk assessment _____________   Date:  11/24/2023   Patient ID:  [name], DOB [birth year], MRN [id number] Patient Location:  Home Provider location:   Office  Primary Care Provider:  Gladis Mustard, FNP Primary Cardiologist:  Alvan Carrier, MD  Chief Complaint / Patient Profile   68 y.o. y/o female with a h/o recurrent syncope/dysautonomia, palpitations, HTN, HLD, normal coronary arteries on cardiac cath in 2013 who is pending left thumb [facility] arthroplasty with internal brace and MCP capsulodesis pinning on 11/27/23 and presents today for telephonic preoperative cardiovascular risk assessment.  History of Present Illness    [name] is a 68 y.o. female who presents via audio/video conferencing for a telehealth visit today.  Pt was last seen in cardiology clinic on 08/27/23 by Almarie Crate, NP.  At that time [name] was doing well.  The patient is now pending procedure as outlined above. Since her last visit, she denies chest pain, shortness of breath, lower extremity edema, fatigue, palpitations, melena, hematuria, hemoptysis, diaphoresis, weakness, presyncope, syncope, orthopnea, and PND. She admits she is not very active with regular exercise but is able to complete > 4 METS activity without concerning cardiac symptoms.   Past Medical History     Past Medical History:  Diagnosis Date   Allergy    Anemia    Anginal pain (HCC)    last time    Anxiety    Arthritis    RHEUMATOID   Asthma    Bipolar 1 disorder (HCC)    Blood transfusion without reported diagnosis    x 3   Cataracts, bilateral 07/2017   CHF (congestive heart failure) (HCC)    COPD (chronic obstructive pulmonary disease) (HCC)    Coronary artery disease    reported hx of MI;  Echo 2009 with normal LVF;  Myoview  05/2011: no ischemia   Depression    Diabetes mellitus without complication (HCC)    Dyslipidemia    Dysrhythmia    SVT   Elevated liver enzymes 06/30/2022   Esophageal stricture    Fibromyalgia    GERD (gastroesophageal reflux disease)    H/O hiatal hernia    Head injury, unspecified    Headache    migraines   Herpes simplex infection    History of kidney stones    History of loop recorder 08/10/2017   Managed by Dr. Cathlyn Birmingham   Hyperlipidemia    Hypertension    Insomnia    Myocardial infarction [facility])    age 43   Osteoporosis    Pneumonia    hx   Seizures (HCC)    none in the last 3-4 years as of 11/03/22 per patient   Shortness of breath    Sleep apnea    mild, does not require positive pressure treatment. (02/03/22)   Spinal stenosis of lumbar region    Spondylolisthesis    Status post placement  of implantable loop recorder    Supraventricular tachycardia (HCC)    Syncope and collapse    s/p ILR; no arhythmogenic cause identified   UTI (lower urinary tract infection)    Past Surgical History:  Procedure Laterality Date   BACK SURGERY     BREAST EXCISIONAL BIOPSY Right    1990s   BREAST SURGERY     lumpectomy   CARDIAC CATHETERIZATION  10/06/2011   CATARACT EXTRACTION W/PHACO Right 07/31/2017   Procedure: CATARACT EXTRACTION PHACO AND INTRAOCULAR LENS PLACEMENT (IOC);  Surgeon: Harrie Agent, MD;  Location: [facility];  Service: Ophthalmology;  Laterality: Right;  CDE: 2.33   CATARACT EXTRACTION W/PHACO Left 08/14/2017    Procedure: CATARACT EXTRACTION PHACO AND INTRAOCULAR LENS PLACEMENT (IOC);  Surgeon: Harrie Agent, MD;  Location: [facility];  Service: Ophthalmology;  Laterality: Left;  CDE: 2.74   CHOLECYSTECTOMY     CYSTOSCOPY     stone   DIAGNOSTIC LAPAROSCOPY     laparoscopic cholecystectomy   DOPPLER ECHOCARDIOGRAPHY  2009   ESOPHAGOGASTRODUODENOSCOPY N/A 10/31/2020   Procedure: ESOPHAGOGASTRODUODENOSCOPY (EGD);  Surgeon: Tanda Locus, MD;  Location: [facility];  Service: General;  Laterality: N/A;   EYE SURGERY Bilateral 08/14/2017   cataract removal   GLUTEUS MINIMUS REPAIR Left 11/04/2022   Procedure: LEFT GLUTEUS MEDIUS REPAIR;  Surgeon: Genelle Standing, MD;  Location: [facility] OR;  Service: Orthopedics;  Laterality: Left;   head up tilt table testing  06/15/2007   Danelle Birmingham   HEMORRHOID SURGERY     HERNIA REPAIR     insertion of implatable loop recorder  08/11/2007   Danelle Birmingham   POSTERIOR CERVICAL FUSION/FORAMINOTOMY N/A 12/19/2013   Procedure: RIGHT C3-4.C4-5 AND C5-6 FORAMINOTOMIES;  Surgeon: Agent FORBES Better, MD;  Location[facility];  Service: Orthopedics;  Laterality: N/A;   TOTAL HIP ARTHROPLASTY Left 01/31/2020   Procedure: LEFT TOTAL HIP ARTHROPLASTY ANTERIOR APPROACH;  Surgeon: Vernetta Lonni GRADE, MD;  Location: [facility] OR;  Service: Orthopedics;  Laterality: Left;   TOTAL SHOULDER ARTHROPLASTY Right 11/15/2019   Procedure: RIGHT TOTAL SHOULDER ARTHROPLASTY;  Surgeon: Addie Cordella Hamilton, MD;  Location: [facility];  Service: Orthopedics;  Laterality: Right;   TOTAL SHOULDER REVISION Right 06/11/2021   Procedure: RIGHT SHOULDER CONVERSION TOTAL SHOULDER ARTHROPLASTY to REVERSE TOTAL SHOULDER ARTHROPLASTY;  Surgeon: Addie Cordella Hamilton, MD;  Location: [facility] OR;  Service: Orthopedics;  Laterality: Right;   TUBAL LIGATION     UPPER GASTROINTESTINAL ENDOSCOPY     XI ROBOTIC ASSISTED HIATAL HERNIA REPAIR N/A 10/31/2020   Procedure: XI ROBOTIC ASSISTED HIATAL HERNIA REPAIR WITH PARTIAL FUNDOPLICATION;  Surgeon:  Tanda Locus, MD;  Location: [facility];  Service: General;  Laterality: N/A;    Allergies  Allergies  Allergen Reactions   Codeine  Other (See Comments)    I will have a heart attack.   Morphine And Codeine  Other (See Comments)    It will cause me to have a heart attack.   Ambien  [Zolpidem  Tartrate] Nausea And Vomiting   Clonidine  Derivatives Nausea And Vomiting    GERD - caused acid reflux per pt   Metformin  And Related Nausea And Vomiting    Cramping from metformin    Lyrica  [Pregabalin ] Swelling and Other (See Comments)    Weight gain   Neurontin [Gabapentin] Other (See Comments)    Causes elevated LFTs     Home Medications    Prior to Admission medications   Medication Sig Start Date End Date Taking? Authorizing Provider  Accu-Chek Softclix Lancets lancets test blood sugar  up to 4 times a day as directed. Dx R73.03 03/02/23   Gladis Mustard, FNP  acyclovir  ointment (ZOVIRAX ) 5 % apply 1 application topically every 3 (three) hours as needed (outbreaks). 10/26/23   Gladis Mustard, FNP  albuterol  (VENTOLIN  HFA) [phone] Base) MCG/ACT inhaler inhale 2 puffs every 6 hours as needed for shortness of breath and wheezing. 09/21/23   Gladis Mustard, FNP  Alcohol  Swabs  (B-D SINGLE USE SWABS  REGULAR) PADS USE 1 PAD DAILY WHEN CHECKING BLOOD SUGAR. R73.03 06/26/22   Gladis Mustard, FNP  blood glucose meter kit and supplies Dispense based on patient and insurance preference. Use up to four times daily as directed. (FOR ICD-10 E10.9, E11.9). 02/04/21   Gladis, [name], FNP  busPIRone  (BUSPAR ) 10 MG tablet Take 1 tablet (10 mg total) by mouth 2 (two) times daily as needed. 06/16/23   Gladis Mustard, FNP  butalbital -acetaminophen -caffeine  (FIORICET ) 50-325-40 MG tablet take 1 tablet by mouth every 6 (six) hours as needed for headache. 08/27/23   Gladis Mustard, FNP  butalbital -acetaminophen -caffeine  (FIORICET ) 50-325-40 MG tablet take 1 tablet by mouth  every 6 (six) hours as needed for headache. 10/26/23   Gladis, [name], FNP  carvedilol  (COREG ) 3.125 MG tablet TAKE 1/2 (0.5) TABLET TWICE A DAY WITH A MEAL; BLOOD PRESSURE OVER 150 BEFORE TAKING. Patient taking differently: 3.125 mg. TAKE 1/2 (0.5) TABLET TWICE A DAY WITH A MEAL; BLOOD PRESSURE OVER 150 BEFORE TAKING. Taking PRN 06/16/23   Gladis Mustard, FNP  celecoxib  (CELEBREX ) 200 MG capsule Take 1 capsule (200 mg total) by mouth daily. 09/10/23 01/08/24  Burnetta Brunet, DO  Continuous Glucose Sensor (FREESTYLE LIBRE 3 PLUS SENSOR) MISC Apply 1 each topically every 14 (fourteen) days. Change sensor every 15 days. 09/11/23   Gladis [name], FNP  dexlansoprazole  (DEXILANT ) 60 MG capsule Take 1 capsule (60 mg total) by mouth daily. 06/16/23   Gladis, [name], FNP  Dextromethorphan-GG-APAP (CORICIDIN HBP COLD/COUGH/FLU) 10-200-325 MG/15ML LIQD Take 30 mLs by mouth every 4 (four) hours as needed (cough). 02/03/23   Milian, Marry Lenis, FNP  diclofenac  Sodium (VOLTAREN ) 1 % GEL APPLY 4 GRAMS TOPICALLY 4 TIMES A DAY. Patient taking differently: Apply 4 g topically 4 (four) times daily as needed (pain). 03/01/21   Gladis [name], FNP  DULoxetine  (CYMBALTA ) 60 MG capsule Take 1 capsule (60 mg total) by mouth at bedtime. 06/16/23   Gladis [name], FNP  empagliflozin  (JARDIANCE ) 25 MG TABS tablet Take 1 tablet (25 mg total) by mouth daily. 06/16/23   Gladis, [name], FNP  escitalopram  (LEXAPRO ) 20 MG tablet Take 1 tablet (20 mg total) by mouth daily. 06/16/23   Gladis [name], FNP  estradiol  (ESTRACE ) 0.1 MG/GM vaginal cream Place 1 Applicatorful vaginally 3 (three) times a week. 03/20/23   Gladis, [name], FNP  FIBER PO Take 1 tablet by mouth in the morning and at bedtime.    [provider]  fludrocortisone  (FLORINEF ) 0.1 MG tablet take 1 tablet (100mcg total) by mouth every other day. 09/21/23   Gladis [name], FNP  fluticasone  (FLONASE )  50 MCG/ACT nasal spray USE 1 SPRAY IN EACH NOSTRIL ONCE DAILY. Patient taking differently: Place 1 spray into both nostrils daily as needed for allergies. 04/04/20   Gladis Mustard, FNP  fluticasone  furoate-vilanterol (BREO ELLIPTA ) 100-25 MCG/ACT AEPB Inhale 1 puff by mouth once daily 04/22/23   Gladis, [name], FNP  furosemide  (LASIX ) 40 MG tablet TAKE 1 TABLET DAILY AS NEEDED FOR EXCESSIVE FLUID. 06/16/23   Gladis, [name], FNP  Glucagon , rDNA, (GLUCAGON  EMERGENCY) 1 MG KIT  INJECT 1 MG INTO THE SKIN AS NEEDED 10/16/23   Gladis, [name], FNP  glucose blood (ACCU-CHEK GUIDE) test strip TEST BLOOD SUGAR UP TO 4 TIMES A DAY AS DIRECTED. R73.03 09/24/22   Gladis, [name], FNP  hydrALAZINE  (APRESOLINE ) 25 MG tablet take 1 tablet by mouth 3 times daily as needed (for severe hypertension/systolic number 170 or greater). 09/21/23   Alvan Dorn FALCON, MD  HYDROcodone -acetaminophen  (NORCO) 7.5-325 MG tablet Take 1 tablet by mouth in the morning, at noon, and at bedtime. 11/12/23 12/12/23  Gladis, [name], FNP  ipratropium (ATROVENT ) 0.02 % nebulizer solution USE 1 VIAL ( ) IN NEBULIZER EVERY 6 HOURS AS NEEDED FOR WHEEZING OR SHORTNESS OF BREATH. 06/28/20   Gladis, [name], FNP  isosorbide  mononitrate (IMDUR ) 60 MG 24 hr tablet Take 1 tablet (60 mg total) by mouth 2 (two) times daily. 06/16/23   Gladis, [name], FNP  lamoTRIgine  (LAMICTAL ) 150 MG tablet take 1 tablet (150 MILLIGRAM total) by mouth daily. 09/21/23   Gladis, [name], FNP  levETIRAcetam  (KEPPRA ) 500 MG tablet TAKE (1) TABLET TWICE DAILY. 09/29/23   Gladis Mustard, FNP  levocetirizine (XYZAL ) 5 MG tablet take 1 tablet by mouth every morning. 06/22/23   Gladis, [name], FNP  lidocaine  (LIDODERM ) 5 % place 1 patch onto the skin daily. remove & discard patch within 12 hours or as directed by md 06/22/23   Gladis Mustard, FNP  lidocaine  (XYLOCAINE ) 5 % ointment apply to affected area 3 times  a day as needed for mild or moderate pain. 08/27/23   Gladis, [name], FNP  methocarbamol  (ROBAXIN ) 500 MG tablet take 1 tablet (500 milligram total) by mouth 4 (four) times daily. 10/26/23   Gladis, [name], FNP  nitroGLYCERIN  (NITROSTAT ) 0.4 MG SL tablet place one (1) tablet under tongue every 5 minutes up to (3) doses as needed for chest pain. 10/26/23   Gladis Mustard, FNP  ondansetron  (ZOFRAN ) 4 MG tablet take 1 tablet by mouth every 8 hours as needed for nausea and vomiting. 10/26/23   Gladis, [name], FNP  polyethylene glycol (MIRALAX  / GLYCOLAX ) 17 g packet Take 17 g by mouth 2 (two) times daily. 07/28/23   Bryn Bernardino NOVAK, MD  promethazine -dextromethorphan (PROMETHAZINE -DM) 6.25-15 MG/5ML syrup Take 5 mLs by mouth 3 (three) times daily as needed for cough. 04/03/23   Lavell Bari LABOR, FNP  rosuvastatin  (CRESTOR ) 10 MG tablet Take 1 tablet (10 mg total) by mouth daily. 06/16/23   Gladis, [name], FNP  traZODone  (DESYREL ) 150 MG tablet Take 1 tablet (150 mg total) by mouth at bedtime. 06/16/23   Gladis [name], FNP  valACYclovir  (VALTREX ) 1000 MG tablet take 1 tablet (1,000 MILLIGRAM total) by mouth 3 (three) times daily as needed (with active outbreaks.). 11/05/23   Gladis Mustard, FNP  valACYclovir  (VALTREX ) 500 MG tablet take (1) tablet twice daily. 09/21/23   Gladis Mustard, FNP    Physical Exam    Vital Signs:  [name] does not have vital signs available for review today.  Given telephonic nature of communication, physical exam is limited. AAOx3. NAD. Normal affect.  Speech and respirations are unlabored.  Accessory Clinical Findings    None  Assessment & Plan    1.  Preoperative Cardiovascular Risk Assessment: According to the Revised Cardiac Risk Index (RCRI), her Perioperative Risk of Major Cardiac Event is (%): 0.4. Her Functional Capacity in METs is: 5.72 according to the [facility] Activity Status Index (DASI).   Elevated  potassium of 5.9 noted on labs from 11/23/23. Will await final clearance  until patient has been given appropriate recommendations for hyperkalemia. Notified anesthesia team of elevated potassium level, recommend redraw given potential that blood sample was hemolyzed given she is not on any potassium sparing medications or potassium supplement.   The patient is doing well from a cardiac perspective. Therefore, based on ACC/AHA guidelines, the patient would be at acceptable risk for the planned procedure without further cardiovascular testing, given potassium level is found to be in therapeutic range.   The patient was advised that if she develops new symptoms prior to surgery to contact our office to arrange for a follow-up visit, and she verbalized understanding.  No request to hold cardiac medications.  A copy of this note will be routed to requesting surgeon.  Time:   Today, I have spent 10 minutes with the patient with telehealth technology discussing medical history, symptoms, and management plan.     Rosaline EMERSON Bane, NP-C  11/24/2023, 12:48 PM [address] [place], [place] [address] Office [phone] Fax [phone]

## 2023-11-24 NOTE — Progress Notes (Signed)
 Patient had labs yesterday 11-23-23 for surgery at [facility] on 11-27-23 and her K+ was 5.9. I called her and explained that she will need to come back in today for repeat labs. She voiced her understanding of this but states that she might not can make the appt as she picks up her granddaughter at 330pm.

## 2023-11-24 NOTE — Telephone Encounter (Signed)
 Left message for patient to get repeat BMET. Asked her to call back for instructions on getting lab work at [facility].

## 2023-11-24 NOTE — Telephone Encounter (Signed)
 Patient verbalized during telephone interview for preop cardiac evaluation that she had potassium level of 5.9 on recent lab work. I reviewed her chart and noted BMET on 11/23/23 with K+ 5.9. No recent history of hyperkalemia and no potassium sparing medications noted on her chart. She is not on potassium supplement. I suspect hemolysis. I notified Isaiah Ruder, PA who is PA for anesthesia team at [facility]. Pt's procedure is scheduled at [facility], and [facility] notified appropriate parties at that location.

## 2023-11-25 NOTE — Progress Notes (Signed)
 BMET redraw from yesterday reviewed and K+ 4.5. Patient called and given new results, no further instructions needed.

## 2023-11-27 ENCOUNTER — Ambulatory Visit (HOSPITAL_BASED_OUTPATIENT_CLINIC_OR_DEPARTMENT_OTHER): Admitting: Anesthesiology

## 2023-11-27 ENCOUNTER — Ambulatory Visit (HOSPITAL_BASED_OUTPATIENT_CLINIC_OR_DEPARTMENT_OTHER)

## 2023-11-27 ENCOUNTER — Encounter (HOSPITAL_BASED_OUTPATIENT_CLINIC_OR_DEPARTMENT_OTHER)
Admission: RE | Disposition: A | Discharge status: Home / Self Care | Payer: Self-pay | Source: Home / Self Care | Attending: Orthopedic Surgery

## 2023-11-27 ENCOUNTER — Ambulatory Visit (HOSPITAL_BASED_OUTPATIENT_CLINIC_OR_DEPARTMENT_OTHER)
Admission: RE | Admit: 2023-11-27 | Discharge: 2023-11-27 | Disposition: A | Discharge status: Home / Self Care | Attending: Orthopedic Surgery | Admitting: Orthopedic Surgery

## 2023-11-27 ENCOUNTER — Other Ambulatory Visit: Payer: Self-pay

## 2023-11-27 ENCOUNTER — Encounter (HOSPITAL_BASED_OUTPATIENT_CLINIC_OR_DEPARTMENT_OTHER): Payer: Self-pay | Admitting: Orthopedic Surgery

## 2023-11-27 DIAGNOSIS — K219 Gastro-esophageal reflux disease without esophagitis: Secondary | ICD-10-CM | POA: Diagnosis not present

## 2023-11-27 DIAGNOSIS — I5032 Chronic diastolic (congestive) heart failure: Secondary | ICD-10-CM | POA: Insufficient documentation

## 2023-11-27 DIAGNOSIS — M1812 Unilateral primary osteoarthritis of first carpometacarpal joint, left hand: Secondary | ICD-10-CM

## 2023-11-27 DIAGNOSIS — I2510 Atherosclerotic heart disease of native coronary artery without angina pectoris: Secondary | ICD-10-CM

## 2023-11-27 DIAGNOSIS — M797 Fibromyalgia: Secondary | ICD-10-CM | POA: Insufficient documentation

## 2023-11-27 DIAGNOSIS — G4730 Sleep apnea, unspecified: Secondary | ICD-10-CM | POA: Insufficient documentation

## 2023-11-27 DIAGNOSIS — Z01818 Encounter for other preprocedural examination: Secondary | ICD-10-CM

## 2023-11-27 DIAGNOSIS — I110 Hypertensive heart disease with heart failure: Secondary | ICD-10-CM | POA: Insufficient documentation

## 2023-11-27 DIAGNOSIS — I252 Old myocardial infarction: Secondary | ICD-10-CM | POA: Diagnosis not present

## 2023-11-27 DIAGNOSIS — J4489 Other specified chronic obstructive pulmonary disease: Secondary | ICD-10-CM | POA: Diagnosis not present

## 2023-11-27 DIAGNOSIS — E119 Type 2 diabetes mellitus without complications: Secondary | ICD-10-CM | POA: Diagnosis not present

## 2023-11-27 DIAGNOSIS — E785 Hyperlipidemia, unspecified: Secondary | ICD-10-CM | POA: Diagnosis not present

## 2023-11-27 HISTORY — PX: FINGER ARTHROPLASTY: SHX5017

## 2023-11-27 LAB — GLUCOSE, CAPILLARY
Glucose-Capillary: 82 mg/dL (ref 70–99)
Glucose-Capillary: 131 mg/dL — ABNORMAL HIGH (ref 70–99)

## 2023-11-27 SURGERY — ARTHROPLASTY, FINGER
Anesthesia: Monitor Anesthesia Care | Site: Finger | Laterality: Left

## 2023-11-27 MED ORDER — CEFAZOLIN SODIUM-DEXTROSE 2-3 GM-%(50ML) IV SOLR
INTRAVENOUS | Status: DC | PRN
  Administered 2023-11-27: 2 g via INTRAVENOUS

## 2023-11-27 MED ORDER — OXYCODONE HCL 5 MG PO TABS: 5.0000 mg | ORAL_TABLET | Freq: Once | ORAL | Status: DC | PRN

## 2023-11-27 MED ORDER — PHENYLEPHRINE 80 MCG/ML (10ML) SYRINGE FOR IV PUSH (FOR BLOOD PRESSURE SUPPORT)
PREFILLED_SYRINGE | INTRAVENOUS | Status: DC | PRN
Start: 2023-11-27 — End: 2023-11-27
  Administered 2023-11-27: 120 ug via INTRAVENOUS
  Administered 2023-11-27: 160 ug via INTRAVENOUS
  Administered 2023-11-27 (×3): 80 ug via INTRAVENOUS

## 2023-11-27 MED ORDER — OXYCODONE HCL 5 MG PO TABS: 5.0000 mg | ORAL_TABLET | Freq: Four times a day (QID) | ORAL | 0 refills | Status: DC | PRN

## 2023-11-27 MED ORDER — ACETAMINOPHEN 10 MG/ML IV SOLN
INTRAVENOUS | Status: DC | PRN
  Administered 2023-11-27: 1000 mg via INTRAVENOUS

## 2023-11-27 MED ORDER — PROPOFOL 500 MG/50ML IV EMUL
INTRAVENOUS | Status: DC | PRN
  Administered 2023-11-27: 100 ug/kg/min via INTRAVENOUS

## 2023-11-27 MED ORDER — FENTANYL CITRATE (PF) 100 MCG/2ML IJ SOLN: 25.0000 ug | INTRAMUSCULAR | Status: DC | PRN

## 2023-11-27 MED ORDER — FENTANYL CITRATE (PF) 100 MCG/2ML IJ SOLN
100.0000 ug | Freq: Once | INTRAMUSCULAR | Status: AC
  Administered 2023-11-27: 50 ug via INTRAVENOUS

## 2023-11-27 MED ORDER — MIDAZOLAM HCL 2 MG/2ML IJ SOLN
INTRAMUSCULAR | Status: AC
  Filled 2023-11-27: qty 2

## 2023-11-27 MED ORDER — LACTATED RINGERS IV SOLN: INTRAVENOUS | Status: DC

## 2023-11-27 MED ORDER — BUPIVACAINE-EPINEPHRINE (PF) 0.5% -1:200000 IJ SOLN
INTRAMUSCULAR | Status: DC | PRN
Start: 2023-11-27 — End: 2023-11-27
  Administered 2023-11-27: 30 mL via PERINEURAL

## 2023-11-27 MED ORDER — EPHEDRINE SULFATE-NACL 50-0.9 MG/10ML-% IV SOSY
PREFILLED_SYRINGE | INTRAVENOUS | Status: DC | PRN
  Administered 2023-11-27 (×3): 5 mg via INTRAVENOUS

## 2023-11-27 MED ORDER — FENTANYL CITRATE (PF) 100 MCG/2ML IJ SOLN
INTRAMUSCULAR | Status: AC
Start: 2023-11-27 — End: 2023-11-27
  Filled 2023-11-27: qty 2

## 2023-11-27 MED ORDER — MIDAZOLAM HCL 2 MG/2ML IJ SOLN
2.0000 mg | Freq: Once | INTRAMUSCULAR | Status: AC
  Administered 2023-11-27: 1 mg via INTRAVENOUS

## 2023-11-27 MED ORDER — KETOROLAC TROMETHAMINE 30 MG/ML IJ SOLN
INTRAMUSCULAR | Status: DC | PRN
  Administered 2023-11-27: 15 mg via INTRAVENOUS

## 2023-11-27 MED ORDER — OXYCODONE HCL 5 MG/5ML PO SOLN: 5.0000 mg | Freq: Once | ORAL | Status: DC | PRN

## 2023-11-27 MED ORDER — DEXAMETHASONE SODIUM PHOSPHATE 10 MG/ML IJ SOLN
INTRAMUSCULAR | Status: DC | PRN
  Administered 2023-11-27: 4 mg via INTRAVENOUS

## 2023-11-27 MED ORDER — CEFAZOLIN SODIUM-DEXTROSE 2-4 GM/100ML-% IV SOLN: 2.0000 g | INTRAVENOUS | Status: DC

## 2023-11-27 MED ORDER — AMISULPRIDE (ANTIEMETIC) 5 MG/2ML IV SOLN: 10.0000 mg | Freq: Once | INTRAVENOUS | Status: DC | PRN

## 2023-11-27 MED ORDER — CEFAZOLIN SODIUM-DEXTROSE 2-4 GM/100ML-% IV SOLN
INTRAVENOUS | Status: AC
  Filled 2023-11-27: qty 100

## 2023-11-27 MED ORDER — DEXMEDETOMIDINE HCL IN NACL 80 MCG/20ML IV SOLN
INTRAVENOUS | Status: DC | PRN
  Administered 2023-11-27: 4 ug via INTRAVENOUS

## 2023-11-27 MED ORDER — ONDANSETRON HCL 4 MG/2ML IJ SOLN
INTRAMUSCULAR | Status: DC | PRN
  Administered 2023-11-27: 4 mg via INTRAVENOUS

## 2023-11-27 MED ORDER — CLONIDINE HCL (ANALGESIA) 100 MCG/ML EP SOLN
EPIDURAL | Status: DC | PRN
  Administered 2023-11-27: 50 ug

## 2023-11-27 SURGICAL SUPPLY — 60 items
.062 KWIRE IMPLANT
ANCHOR REPAIR HAND WRIST (Anchor) IMPLANT
BLADE SURG 15 STRL LF DISP TIS (BLADE) ×2 IMPLANT
BNDG COHESIVE 4X5 TAN STRL LF (GAUZE/BANDAGES/DRESSINGS) ×1 IMPLANT
BNDG COMPR ESMARK 4X3 LF (GAUZE/BANDAGES/DRESSINGS) ×1 IMPLANT
BNDG ELASTIC 4INX 5YD STR LF (GAUZE/BANDAGES/DRESSINGS) ×2 IMPLANT
BNDG GAUZE DERMACEA FLUFF 4 (GAUZE/BANDAGES/DRESSINGS) ×1 IMPLANT
BUR EGG 3PK/BX (BURR) IMPLANT
BUR OVAL CARBIDE 4.0 (BURR) IMPLANT
BUR PEAR (BURR) IMPLANT
CHLORAPREP W/TINT 26 (MISCELLANEOUS) ×1 IMPLANT
CORD BIPOLAR FORCEPS 12FT (ELECTRODE) ×1 IMPLANT
COVER BACK TABLE 60X90IN (DRAPES) ×1 IMPLANT
CUFF TOURN SGL QUICK 18X4 (TOURNIQUET CUFF) IMPLANT
CUFF TRNQT CYL 24X4X16.5-23 (TOURNIQUET CUFF) ×1 IMPLANT
DRAPE HAND 77X146 (DRAPES) ×1 IMPLANT
DRAPE OEC MINIVIEW 54X84 (DRAPES) ×1 IMPLANT
DRAPE SURG 17X23 STRL (DRAPES) ×1 IMPLANT
GAUZE PAD ABD 8X10 STRL (GAUZE/BANDAGES/DRESSINGS) IMPLANT
GAUZE SPONGE 4X4 12PLY STRL (GAUZE/BANDAGES/DRESSINGS) ×1 IMPLANT
GAUZE XEROFORM 1X8 LF (GAUZE/BANDAGES/DRESSINGS) ×1 IMPLANT
GLOVE BIO SURGEON STRL SZ7.5 (GLOVE) ×2 IMPLANT
GLOVE BIOGEL PI IND STRL 7.5 (GLOVE) ×2 IMPLANT
GOWN STRL REUS W/ TWL LRG LVL3 (GOWN DISPOSABLE) ×1 IMPLANT
GOWN STRL REUS W/TWL XL LVL3 (GOWN DISPOSABLE) ×1 IMPLANT
GOWN STRL SURGICAL XL XLNG (GOWN DISPOSABLE) ×1 IMPLANT
GUIDEWIRE ORTH 6X062XTROC NS (WIRE) IMPLANT
MANIFOLD NEPTUNE II (INSTRUMENTS) ×1 IMPLANT
NDL HYPO 25X1 1.5 SAFETY (NEEDLE) IMPLANT
NDL HYPO 25X5/8 SAFETYGLIDE (NEEDLE) IMPLANT
NDL KEITH (NEEDLE) IMPLANT
NEEDLE HYPO 25X1 1.5 SAFETY (NEEDLE) IMPLANT
NEEDLE HYPO 25X5/8 SAFETYGLIDE (NEEDLE) IMPLANT
NEEDLE KEITH (NEEDLE) IMPLANT
NS IRRIG 1000ML POUR BTL (IV SOLUTION) IMPLANT
PACK BASIN DAY SURGERY FS (CUSTOM PROCEDURE TRAY) ×1 IMPLANT
PAD CAST 4YDX4 CTTN HI CHSV (CAST SUPPLIES) ×2 IMPLANT
PIN GUARD 1.57MM GREEN STERILE (PIN) IMPLANT
SHEET MEDIUM DRAPE 40X70 STRL (DRAPES) ×1 IMPLANT
SLEEVE SCD COMPRESS KNEE MED (STOCKING) ×1 IMPLANT
SLING ARM FOAM STRAP MED (SOFTGOODS) IMPLANT
SPIKE FLUID TRANSFER (MISCELLANEOUS) IMPLANT
SPLINT PLASTER CAST XFAST 4X15 (CAST SUPPLIES) ×10 IMPLANT
SPONGE SURGIFOAM ABS GEL 12-7 (HEMOSTASIS) IMPLANT
STOCKINETTE IMPERVIOUS 9X36 MD (GAUZE/BANDAGES/DRESSINGS) ×1 IMPLANT
STRIP CLOSURE SKIN 1/2X4 (GAUZE/BANDAGES/DRESSINGS) IMPLANT
SUCTION TUBE FRAZIER 10FR DISP (SUCTIONS) IMPLANT
SUT ETHILON 4 0 PS 2 18 (SUTURE) ×1 IMPLANT
SUT MNCRL AB 3-0 PS2 27 (SUTURE) ×1 IMPLANT
SUT MNCRL AB 4-0 PS2 18 (SUTURE) IMPLANT
SUT PDS AB 4-0 RB1 27 (SUTURE) IMPLANT
SUT SILK 4 0 PS 2 (SUTURE) IMPLANT
SUT VIC AB 3-0 FS2 27 (SUTURE) IMPLANT
SUT VIC AB 4-0 PS2 18 (SUTURE) IMPLANT
SUTURE 0 FIBERLP 38 BLU TPR ND (SUTURE) IMPLANT
SYR BULB EAR ULCER 3OZ GRN STR (SYRINGE) ×2 IMPLANT
SYR CONTROL 10ML LL (SYRINGE) IMPLANT
TOWEL GREEN STERILE FF (TOWEL DISPOSABLE) ×2 IMPLANT
TUBE CONNECTING 20X1/4 (TUBING) IMPLANT
UNDERPAD 30X36 HEAVY ABSORB (UNDERPADS AND DIAPERS) ×1 IMPLANT

## 2023-11-27 NOTE — Transfer of Care (Signed)
 Immediate Anesthesia Transfer of Care Note  Patient: [name]  Procedure(s) Performed: ARTHROPLASTY, FINGER (Left: Finger)  Patient Location: PACU  Anesthesia Type:MAC and Regional  Level of Consciousness: awake, alert , and oriented  Airway & Oxygen  Therapy: Patient Spontanous Breathing  Post-op Assessment: Report given to RN and Post -op Vital signs reviewed and stable  Post vital signs: Reviewed and stable  Last Vitals:  Vitals Value Taken Time  BP 108/68   Temp    Pulse 66 11/27/23 08:56  Resp 11 11/27/23 08:56  SpO2 92 % 11/27/23 08:56  Vitals shown include unfiled device data.  Last Pain:  Vitals:   11/27/23 0631  PainSc: 0-No pain      Patients Stated Pain Goal: 3 (11/27/23 0631)  Complications: No notable events documented.

## 2023-11-27 NOTE — Discharge Instructions (Addendum)
 Hand Surgery Postop Instructions   Dressings: Maintain postoperative dressing until orthopedic follow-up.  Keep operative site clean and dry until orthopedic follow-up.  Wound Care: Keep your hand elevated above the level of your heart.  Do not allow it to dangle by your side. Moving your fingers is advised to stimulate circulation but will depend on the site of your surgery.  If you have a splint applied, your doctor will advise you regarding movement.  Activity: Do not drive or operate machinery until clearance given from physician. No heavy lifting with operative extremity.  Diet:  Drink liquids today or eat a light diet.  You may resume a regular diet tomorrow.    General expectations: Take prescribed medication if given, transition to over-the-counter medication as quickly as possible. Fingers may become slightly swollen.  Call your doctor if any of the following occur: Severe pain not relieved by pain medication. Elevated temperature. Dressing soaked with blood. Inability to move fingers. White or bluish color to fingers.   Per [facility] clinic policy, our goal is ensure optimal postoperative pain control with a multimodal pain management strategy. For all [facility] patients, our goal is to wean post-operative narcotic medications by 6 weeks post-operatively. If this is not possible due to utilization of pain medication prior to surgery, your [facility] doctor will support your acute post-operative pain control for the first 6 weeks postoperatively, with a plan to transition you back to your primary pain team following that. Maralee will work to ensure a Therapist, occupational.  Anshul Afton Alderton, M.D. Hand Surgery [facility]     Post Anesthesia Home Care Instructions  Activity: Get plenty of rest for the remainder of the day. A responsible individual must stay with you for 24 hours following the procedure.  For the next 24 hours, DO NOT: -Drive a  car -Advertising copywriter -Drink alcoholic beverages -Take any medication unless instructed by your physician -Make any legal decisions or sign important papers.  Meals: Start with liquid foods such as gelatin or soup. Progress to regular foods as tolerated. Avoid greasy, spicy, heavy foods. If nausea and/or vomiting occur, drink only clear liquids until the nausea and/or vomiting subsides. Call your physician if vomiting continues.  Special Instructions/Symptoms: Your throat may feel dry or sore from the anesthesia or the breathing tube placed in your throat during surgery. If this causes discomfort, gargle with warm salt water . The discomfort should disappear within 24 hours.  If you had a scopolamine  patch placed behind your ear for the management of post- operative nausea and/or vomiting:  1. The medication in the patch is effective for 72 hours, after which it should be removed.  Wrap patch in a tissue and discard in the trash. Wash hands thoroughly with soap and water . 2. You may remove the patch earlier than 72 hours if you experience unpleasant side effects which may include dry mouth, dizziness or visual disturbances. 3. Avoid touching the patch. Wash your hands with soap and water  after contact with the patch.     Regional Anesthesia Blocks  1. You may not be able to move or feel the blocked extremity after a regional anesthetic block. This may last may last from 3-48 hours after placement, but it will go away. The length of time depends on the medication injected and your individual response to the medication. As the nerves start to wake up, you may experience tingling as the movement and feeling returns to your extremity. If the numbness and inability  to move your extremity has not gone away after 48 hours, please call your surgeon.   2. The extremity that is blocked will need to be protected until the numbness is gone and the strength has returned. Because you cannot feel it, you  will need to take extra care to avoid injury. Because it may be weak, you may have difficulty moving it or using it. You may not know what position it is in without looking at it while the block is in effect.  3. For blocks in the legs and feet, returning to weight bearing and walking needs to be done carefully. You will need to wait until the numbness is entirely gone and the strength has returned. You should be able to move your leg and foot normally before you try and bear weight or walk. You will need someone to be with you when you first try to ensure you do not fall and possibly risk injury.  4. Bruising and tenderness at the needle site are common side effects and will resolve in a few days.  5. Persistent numbness or new problems with movement should be communicated to the surgeon or the [facility] [phone] [facility] [phone]).    Next dose of Tylenol  may be given at 1:35pm if needed. Next dose of NSAIDs (Ibuprofen/Motrin/Aleve ) may be given at 4:30pm if needed.

## 2023-11-27 NOTE — Anesthesia Preprocedure Evaluation (Signed)
 Anesthesia Evaluation  Patient identified by MRN, date of birth, ID band Patient awake    Reviewed: Allergy & Precautions, NPO status , Patient's Chart, lab work & pertinent test results  Airway Mallampati: II  TM Distance: >3 FB Neck ROM: Full    Dental  (+) Dental Advisory Given   Pulmonary asthma , sleep apnea , COPD   breath sounds clear to auscultation       Cardiovascular hypertension, Pt. on medications and Pt. on home beta blockers + CAD  + dysrhythmias Supra Ventricular Tachycardia  Rhythm:Regular Rate:Normal     Neuro/Psych Seizures -,  PSYCHIATRIC DISORDERS Anxiety Depression Bipolar Disorder      GI/Hepatic Neg liver ROS, hiatal hernia,GERD  ,,  Endo/Other  diabetes, Type 2    Renal/GU negative Renal ROS     Musculoskeletal  (+) Arthritis ,    Abdominal   Peds  Hematology negative hematology ROS (+)   Anesthesia Other Findings   Reproductive/Obstetrics                              Anesthesia Physical Anesthesia Plan  ASA: 3  Anesthesia Plan: Regional and MAC   Post-op Pain Management: Regional block*, Ofirmev  IV (intra-op)* and Toradol  IV (intra-op)*   Induction:   PONV Risk Score and Plan: 2 and Propofol  infusion, Ondansetron  and Treatment may vary due to age or medical condition  Airway Management Planned: Natural Airway and Simple Face Mask  Additional Equipment:   Intra-op Plan:   Post-operative Plan:   Informed Consent: I have reviewed the patients History and Physical, chart, labs and discussed the procedure including the risks, benefits and alternatives for the proposed anesthesia with the patient or authorized representative who has indicated his/her understanding and acceptance.     Dental advisory given  Plan Discussed with: CRNA  Anesthesia Plan Comments:         Anesthesia Quick Evaluation

## 2023-11-27 NOTE — Progress Notes (Signed)
Assisted Dr. Rob Fitzgerald with left, axillary, ultrasound guided block. Side rails up, monitors on throughout procedure. See vital signs in flow sheet. Tolerated Procedure well. 

## 2023-11-27 NOTE — Anesthesia Postprocedure Evaluation (Signed)
 Anesthesia Post Note  Patient: [name]  Procedure(s) Performed: ARTHROPLASTY, FINGER (Left: Finger)     Patient location during evaluation: PACU Anesthesia Type: Regional Level of consciousness: awake and alert Pain management: pain level controlled Vital Signs Assessment: post-procedure vital signs reviewed and stable Respiratory status: spontaneous breathing, nonlabored ventilation, respiratory function stable and patient connected to nasal cannula oxygen  Cardiovascular status: stable and blood pressure returned to baseline Postop Assessment: no apparent nausea or vomiting Anesthetic complications: no   No notable events documented.  Last Vitals:  Vitals:   11/27/23 0930 11/27/23 1030  BP: 110/74 126/81  Pulse: 62 76  Resp: 17 20  Temp:  36.5 C  SpO2: 93% 93%    Last Pain:  Vitals:   11/27/23 1030  TempSrc: Temporal  PainSc: 0-No pain                 Epifanio Lamar BRAVO

## 2023-11-27 NOTE — Op Note (Signed)
 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   ASSISTANT: Almeda Rummer, PA   ANESTHESIA:  Regional with sedation   INTRAVENOUS FLUIDS:  Per anesthesia flow sheet.   ESTIMATED BLOOD LOSS:  Minimal.   COMPLICATIONS:  None.   SPECIMENS:  none   TOURNIQUET TIME:    Total Tourniquet Time Documented: Upper Arm (Left) - 41 minutes Total: Upper Arm (Left) - 41 minutes    DISPOSITION:  Stable to PACU.   INDICATIONS: 68 year old female with history of left thumb carpometacarpal arthritis that was refractory conservative care.  Patient was also found to have associated MCP hyperextension.  Patient was indicated for left thumb CMC arthroplasty with associated MCP capsulodesis and pinning.  Risks and benefits of surgery were discussed including the risks of infection, bleeding, scarring, stiffness, nerve injury, vascular injury, tendon injury, need for subsequent operation, subsidence.  She voiced understanding of these risks and elected to proceed.  OPERATIVE COURSE: Patient was seen and identified in the preoperative area and marked appropriately.  Surgical consent had been signed. Preoperative IV antibiotic prophylaxis was given. She was transferred to the operating room and placed in supine position with the Left upper extremity on an arm board.  Sedation was  induced by the anesthesiologist. A regional block had been performed by anesthesia in preoperative holding.    Left upper extremity was prepped and draped in normal sterile orthopedic fashion.  A surgical pause was performed between the surgeons, anesthesia, and operating room staff and all were in agreement as to the patient, procedure, and site of procedure.  Tourniquet was placed and padded appropriately to the left upper arm.  The arm was exsanguinated and the tourniquet inflated to .  A straight line incision was made over the dorsum of the left thumb CMC joint at the glabrous/nonglabrous junction.  Crossing branches of the radial sensory nerve were identified and carefully protected.  The radial artery was identified and protected.  The first extensor compartment was first released.  The tendons of the first tensor compartment were identified within the incisional site.  The dorsal most aspect of the tendon sheath was then released and carried down over the radial styloid.  Following release of the first extensor compartment, CMC arthrotomy was completed.  Thick capsular flaps were elevated both radially and ulnarly.  The trapezium was identified and confirmed with biplanar fluoroscopy.  This was carefully freed and dissected, then removed utilizing a rongeur.  Care was taken to avoid injury to the underlying flexor carpi radialis tendon.  Following complete trapeziectomy, the scaphoid trapezoid joint was inspected.  There was noted to be cartilage loss and degenerative changes at the joint involving the articular surface of the trapezoid, consequently a partial excision of the trapezoid was performed.  At this point, the Arthrex internal brace was utilized.  Base of the index metacarpal was identified utilizing biplanar fluoroscopy, the K wire was inserted into the nonarticular portion followed by drill and the first suture  anchor.  Biplanar fluoroscopy was utilized to confirm appropriate  positioning of the wire prior to anchor placement.  Stability was noted to this suture anchor.  Subsequently, the thumb metacarpal base was identified followed by placement of the additional suture anchor and suspension of the internal brace.  Thumb was held in gently abducted position for internal brace placement.  Biplanar fluoroscopy was utilized to confirm appropriate thumb metacarpal positioning, no significant subsidence was noted with gentle stress testing.  At this stage in the procedure, the FCR tendon was identified and the depth of the wound.  The APL tendons were identified at their distal extent overlying the base the metacarpal.  An 0 FiberWire was utilized to create a tendon transfer between the APL tendon slips and the FCR with traction on the thumb.  Excellent stability was noted.  The void created by the trapeziectomy was filled with a Gelfoam.  The capsular flaps were repaired utilizing 3-0 Vicryl suture in figure-of-eight fashion.  In addition, given the ongoing MCP hyperextension of approximately 15 degrees preoperatively, decision was made to proceed with MCP capsulodesis and pinning. 0.062 K wire was introduced in retrograde fashion across the thumb MCP joint in slight flexion.  Biplanar fluoroscopy was utilized to confirm appropriate pin placement at the MCP joint.  Wire was then bent and cut appropriately.  The tourniquet was deflated at 41 minutes.  Fingertips were pink with brisk capillary refill after deflation of tourniquet.  Copious irrigation was performed.  Incision was closed in layers utilizing 3-0 Vicryl for the subcutaneous tissue and a 4-0 running Monocryl for the skin surface.  Dermabond was placed.  Sterile dressings were provided followed by application of a thumb spica splint utilizing plaster.  The operative drapes were broken down.  The patient was awoken from anesthesia safely and taken to PACU in stable condition.  I will see her back in the office in 2 weeks for  postoperative followup.     Post-operative plan: The patient will recover in the post-anesthesia care unit and then be discharged home.  The patient will be non weight bearing on the left upper extremity in a thumb spica splint and sling when ambulatory.   I will see the patient back in the office in 2 weeks for postoperative followup.   A surgical assistant was utilized throughout this procedure.  Their presence was helpful in protecting critical neurovascular structures and during the critical portions of the procedure.  The assistant was also critical in minimizing operative time and patient morbidity.    Estel Scholze, MD Electronically signed, 11/27/23

## 2023-11-27 NOTE — Anesthesia Procedure Notes (Signed)
 Anesthesia Regional Block: Axillary brachial plexus block   Pre-Anesthetic Checklist: , timeout performed,  Correct Patient, Correct Site, Correct Laterality,  Correct Procedure, Correct Position, site marked,  Risks and benefits discussed,  Surgical consent,  Pre-op evaluation,  At surgeon's request and post-op pain management  Laterality: Left  Prep: chloraprep       Needles:  Injection technique: Single-shot  Needle Type: Echogenic Stimulator Needle     Needle Length: 9cm  Needle Gauge: 21     Additional Needles:   Procedures:, nerve stimulator,,, ultrasound used (permanent image in chart),,     Nerve Stimulator or Paresthesia:  Response: Radial, median, MC responses all elicited, 0.5 mA  Additional Responses:   Narrative:  Start time: 11/27/2023 7:07 AM End time: 11/27/2023 7:15 AM Injection made incrementally with aspirations every 5 mL.  Performed by: Personally  Anesthesiologist: Epifanio Charleston, MD

## 2023-11-27 NOTE — H&P (Signed)
 ALEENA KIRKEBY - 68 y.o. female MRN [id number]  Date of birth: Oct 07, 1955   Office Visit Note: Visit Date: 10/07/2023 PCP: Gladis Mustard, FNP Referred by: Gladis, [name], *   Subjective: No chief complaint on file.   HPI: LUDELLA PRANGER is a pleasant 68 y.o. female who presents today for left thumb CMC arthroplasty with MP capsulodesis and pinning.     Assessment & Plan: Visit Diagnoses:  1. Primary osteoarthritis of first carpometacarpal joint of left hand       Plan:    Will move forward with surgical intervention.  Given the significance of the degenerative change on x-ray which clinically correlates with examination, we can move forward with left thumb CMC arthroplasty.   Risks and benefits of the procedure were discussed, risks including but not limited to infection, bleeding, scarring, stiffness, nerve injury, tendon injury, vascular injury, hardware complication, recurrence of symptoms and need for subsequent operation.  We also discussed the specifics of the postoperative protocol and the appropriate timeline.  Patient expressed understanding.     Follow-up: No follow-ups on file.    Meds & Orders: No orders of the defined types were placed in this encounter.      Orders Placed This Encounter  Procedures   XR Wrist Complete Left      Procedures: No procedures performed       Clinical History: No specialty comments available.  She reports that she has never smoked. She has never used smokeless tobacco.  Recent Labs (within last 365 days)     Recent Labs    07/27/23 0447  HGBA1C 5.7*        Objective:   Vital Signs: There were no vitals taken for this visit.   Physical Exam  Gen: Well-appearing, in no acute distress; non-toxic CV: Regular Rate. Well-perfused. Warm.  Resp: Breathing unlabored on room air; no wheezing. Psych: Fluid speech in conversation; appropriate affect; normal thought process   Ortho Exam General: Patient  is well appearing and in no distress.    Skin and Muscle: Muscle bulk and contour normal, no signs of atrophy.      Range of Motion and Palpation Tests: Mobility is full about the elbows with flexion and extension.  Forearm supination and pronation are 85/85 bilaterally.  Wrist flexion/extension is 75/65 bilaterally.  Digital flexion and extension are full.  Thumb opposition is full to the base of the small fingers bilaterally.     No cords or nodules are palpated.  No triggering is observed.     Significant tenderness over the left thumb CMC articulation is observed, positive grind for pain, positive crepitus.  MP hyperextension mild, approximately 10 degrees.    Finklestein test mildly positive   Neurologic, Vascular, Motor: Sensation is intact to light touch in the median/radial/ulnar distributions.  Tinel's testing negative at wrist level.   Fingers pink and well perfused.  Capillary refill is brisk.       Imaging: XR Wrist Complete Left Result Date: 10/07/2023 X-rays of the left wrist demonstrate significant degenerative change of the thumb [facility] articulation with joint space narrowing, osteophyte formation and subchondral sclerosis.  There is associated STT arthritis as well.     Past Medical/Family/Surgical/Social History: Medications & Allergies reviewed per EMR, new medications updated.      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   Hypokalemia 11/25/2020   S/P laparoscopic fundoplication 10/31/2020   Status post total replacement of left hip 01/31/2020   Unilateral primary osteoarthritis, left hip 12/21/2019   S/P shoulder replacement,  right 11/15/2019   Overweight (BMI 25.0-29.9) 04/15/2019   Diverticulosis 04/14/2019   Chronic diastolic CHF (congestive heart failure) (HCC) 04/14/2019   Unstable angina (HCC) 10/20/2017   Chronic pain 09/23/2016   Pre-diabetes 09/02/2016   Recurrent major depressive disorder, in partial remission (HCC) 07/14/2016   Seizures (HCC) 07/14/2016   GAD (generalized anxiety disorder) 07/14/2016   Insomnia 09/05/2014   Allergic rhinitis 09/05/2014   Cervical spondylosis without myelopathy 12/19/2013      Class: Chronic   Neural foraminal stenosis of cervical spine 12/19/2013   Cervical radiculitis 09/19/2013   Lumbosacral spondylosis without myelopathy 11/16/2012   Postlaminectomy syndrome, lumbar region 11/16/2012   Herpes simplex virus (HSV) infection 10/31/2008   Hyperlipidemia with target LDL less than 100 10/31/2008   Primary hypertension 10/31/2008   GERD 10/31/2008   SPINAL STENOSIS OF LUMBAR REGION 10/31/2008   Myalgia 10/31/2008   Osteoporosis 10/31/2008   SPONDYLOLISTHESIS 10/31/2008   SYNCOPE 10/31/2008   Sleep apnea 10/31/2008        Past Medical History:  Diagnosis Date   Allergy     Anemia     Anginal pain (HCC)      last time    Anxiety     Arthritis      RHEUMATOID   Asthma     Bipolar 1 disorder (HCC)     Blood transfusion without reported diagnosis      x 3   Cataracts, bilateral 07/2017   CHF (congestive heart failure) (HCC)     COPD (chronic obstructive pulmonary disease) (HCC)     Coronary artery disease      reported hx of MI;  Echo 2009 with normal LVF;  Myoview  05/2011: no ischemia   Depression     Diabetes mellitus without complication (HCC)     Dyslipidemia     Dysrhythmia      SVT   Elevated liver enzymes 06/30/2022   Esophageal stricture     Fibromyalgia     GERD (gastroesophageal reflux disease)     H/O hiatal hernia     Head injury, unspecified     Headache      migraines   Herpes simplex infection     History of kidney stones      History of loop recorder 08/10/2017    Managed by Dr. Cathlyn Birmingham   Hyperlipidemia     Hypertension     Insomnia     Myocardial infarction [facility])      age 15   Osteoporosis     Pneumonia      hx   Seizures (HCC)      none in the last 3-4 years as of 11/03/22 per patient   Shortness of breath     Sleep apnea      mild, does not require positive pressure treatment. (02/03/22)   Spinal stenosis of lumbar region     Spondylolisthesis     Status post placement of implantable loop recorder     Supraventricular tachycardia (HCC)     Syncope and collapse      s/p ILR; no arhythmogenic cause identified  UTI (lower urinary tract infection)               Family History  Problem Relation Age of Onset   Heart attack Father     Mental illness Father     Mental illness Mother     Heart attack Brother          stents   Alcohol  abuse Brother     Heart disease Brother     Drug abuse Brother     Diabetes Brother     Colon cancer Maternal Aunt     Cirrhosis Brother     Stomach cancer Neg Hx     Esophageal cancer Neg Hx     Rectal cancer Neg Hx               Past Surgical History:  Procedure Laterality Date   BACK SURGERY       BREAST EXCISIONAL BIOPSY Right      1990s   BREAST SURGERY        lumpectomy   CARDIAC CATHETERIZATION   10/06/2011   CATARACT EXTRACTION W/PHACO Right 07/31/2017    Procedure: CATARACT EXTRACTION PHACO AND INTRAOCULAR LENS PLACEMENT (IOC);  Surgeon: Harrie Agent, MD;  Location: [facility];  Service: Ophthalmology;  Laterality: Right;  CDE: 2.33   CATARACT EXTRACTION W/PHACO Left 08/14/2017    Procedure: CATARACT EXTRACTION PHACO AND INTRAOCULAR LENS PLACEMENT (IOC);  Surgeon: Harrie Agent, MD;  Location: [facility];  Service: Ophthalmology;  Laterality: Left;  CDE: 2.74   CHOLECYSTECTOMY       CYSTOSCOPY        stone   DIAGNOSTIC LAPAROSCOPY        laparoscopic cholecystectomy   DOPPLER ECHOCARDIOGRAPHY   2009   ESOPHAGOGASTRODUODENOSCOPY N/A 10/31/2020     Procedure: ESOPHAGOGASTRODUODENOSCOPY (EGD);  Surgeon: Tanda Locus, MD;  Location: [facility];  Service: General;  Laterality: N/A;   EYE SURGERY Bilateral 08/14/2017    cataract removal   GLUTEUS MINIMUS REPAIR Left 11/04/2022    Procedure: LEFT GLUTEUS MEDIUS REPAIR;  Surgeon: Genelle Standing, MD;  Location: [facility];  Service: Orthopedics;  Laterality: Left;   head up tilt table testing   06/15/2007    Danelle Birmingham   HEMORRHOID SURGERY       HERNIA REPAIR       insertion of implatable loop recorder   08/11/2007    Danelle Birmingham   POSTERIOR CERVICAL FUSION/FORAMINOTOMY N/A 12/19/2013    Procedure: RIGHT C3-4.C4-5 AND C5-6 FORAMINOTOMIES;  Surgeon: Agent FORBES Better, MD;  Location: [facility];  Service: Orthopedics;  Laterality: N/A;   TOTAL HIP ARTHROPLASTY Left 01/31/2020    Procedure: LEFT TOTAL HIP ARTHROPLASTY ANTERIOR APPROACH;  Surgeon: Vernetta Lonni GRADE, MD;  Location: [facility];  Service: Orthopedics;  Laterality: Left;   TOTAL SHOULDER ARTHROPLASTY Right 11/15/2019    Procedure: RIGHT TOTAL SHOULDER ARTHROPLASTY;  Surgeon: Addie Cordella Hamilton, MD;  Location: [facility];  Service: Orthopedics;  Laterality: Right;   TOTAL SHOULDER REVISION Right 06/11/2021    Procedure: RIGHT SHOULDER CONVERSION TOTAL SHOULDER ARTHROPLASTY to REVERSE TOTAL SHOULDER ARTHROPLASTY;  Surgeon: Addie Cordella Hamilton, MD;  Location: [facility];  Service: Orthopedics;  Laterality: Right;   TUBAL LIGATION       UPPER GASTROINTESTINAL ENDOSCOPY       XI ROBOTIC ASSISTED HIATAL HERNIA REPAIR N/A 10/31/2020    Procedure: XI ROBOTIC ASSISTED HIATAL HERNIA REPAIR WITH PARTIAL FUNDOPLICATION;  Surgeon: Tanda Locus, MD;  Location: [facility];  Service: General;  Laterality:  N/A;        Social History         Occupational History   Occupation: Disability      Comment: 15 years  Tobacco Use   Smoking status: Never   Smokeless tobacco: Never  Vaping Use   Vaping status: Never Used  Substance and Sexual Activity   Alcohol  use: No       Alcohol /week: 0.0 standard drinks of alcohol    Drug use: No   Sexual activity: Yes      Birth control/protection: Post-menopausal      Destyne Goodreau Estela) Arlinda, M.D. [facility], Hand Surgery

## 2023-11-29 ENCOUNTER — Other Ambulatory Visit: Payer: Self-pay | Admitting: Nurse Practitioner

## 2023-11-29 DIAGNOSIS — B009 Herpesviral infection, unspecified: Secondary | ICD-10-CM

## 2023-12-01 ENCOUNTER — Encounter (HOSPITAL_BASED_OUTPATIENT_CLINIC_OR_DEPARTMENT_OTHER): Payer: Self-pay | Admitting: Orthopedic Surgery

## 2023-12-01 ENCOUNTER — Other Ambulatory Visit: Payer: Self-pay

## 2023-12-03 ENCOUNTER — Other Ambulatory Visit: Payer: Self-pay | Admitting: Orthopedic Surgery

## 2023-12-03 ENCOUNTER — Encounter: Payer: Self-pay | Admitting: Orthopedic Surgery

## 2023-12-03 MED ORDER — OXYCODONE HCL 5 MG PO TABS: 5.0000 mg | ORAL_TABLET | Freq: Four times a day (QID) | ORAL | 0 refills | Status: DC | PRN

## 2023-12-08 ENCOUNTER — Ambulatory Visit: Admitting: Nurse Practitioner

## 2023-12-08 ENCOUNTER — Encounter: Payer: Self-pay | Admitting: Nurse Practitioner

## 2023-12-08 VITALS — BP 145/77 | HR 80 | Temp 97.0°F | Ht 62.0 in | Wt 131.4 lb

## 2023-12-08 DIAGNOSIS — M79644 Pain in right finger(s): Secondary | ICD-10-CM

## 2023-12-08 DIAGNOSIS — G894 Chronic pain syndrome: Secondary | ICD-10-CM | POA: Diagnosis not present

## 2023-12-08 DIAGNOSIS — R0789 Other chest pain: Secondary | ICD-10-CM

## 2023-12-08 DIAGNOSIS — E785 Hyperlipidemia, unspecified: Secondary | ICD-10-CM | POA: Diagnosis not present

## 2023-12-08 DIAGNOSIS — K2100 Gastro-esophageal reflux disease with esophagitis, without bleeding: Secondary | ICD-10-CM | POA: Diagnosis not present

## 2023-12-08 DIAGNOSIS — F5101 Primary insomnia: Secondary | ICD-10-CM

## 2023-12-08 DIAGNOSIS — Q762 Congenital spondylolisthesis: Secondary | ICD-10-CM

## 2023-12-08 DIAGNOSIS — F411 Generalized anxiety disorder: Secondary | ICD-10-CM

## 2023-12-08 DIAGNOSIS — E119 Type 2 diabetes mellitus without complications: Secondary | ICD-10-CM | POA: Diagnosis not present

## 2023-12-08 DIAGNOSIS — M1811 Unilateral primary osteoarthritis of first carpometacarpal joint, right hand: Secondary | ICD-10-CM

## 2023-12-08 DIAGNOSIS — I10 Essential (primary) hypertension: Secondary | ICD-10-CM | POA: Diagnosis not present

## 2023-12-08 DIAGNOSIS — E876 Hypokalemia: Secondary | ICD-10-CM

## 2023-12-08 DIAGNOSIS — I5032 Chronic diastolic (congestive) heart failure: Secondary | ICD-10-CM | POA: Diagnosis not present

## 2023-12-08 DIAGNOSIS — R7303 Prediabetes: Secondary | ICD-10-CM

## 2023-12-08 DIAGNOSIS — Z23 Encounter for immunization: Secondary | ICD-10-CM | POA: Diagnosis not present

## 2023-12-08 DIAGNOSIS — M1812 Unilateral primary osteoarthritis of first carpometacarpal joint, left hand: Secondary | ICD-10-CM

## 2023-12-08 DIAGNOSIS — R569 Unspecified convulsions: Secondary | ICD-10-CM | POA: Diagnosis not present

## 2023-12-08 DIAGNOSIS — G4733 Obstructive sleep apnea (adult) (pediatric): Secondary | ICD-10-CM | POA: Diagnosis not present

## 2023-12-08 DIAGNOSIS — K5790 Diverticulosis of intestine, part unspecified, without perforation or abscess without bleeding: Secondary | ICD-10-CM | POA: Diagnosis not present

## 2023-12-08 DIAGNOSIS — F3341 Major depressive disorder, recurrent, in partial remission: Secondary | ICD-10-CM

## 2023-12-08 DIAGNOSIS — Z7984 Long term (current) use of oral hypoglycemic drugs: Secondary | ICD-10-CM | POA: Diagnosis not present

## 2023-12-08 LAB — BAYER DCA HB A1C WAIVED: HB A1C (BAYER DCA - WAIVED): 5.6 % (ref 4.8–5.6)

## 2023-12-08 LAB — LIPID PANEL

## 2023-12-08 MED ORDER — ESCITALOPRAM OXALATE 20 MG PO TABS
20.0000 mg | ORAL_TABLET | Freq: Every day | ORAL | 1 refills | Status: AC
Start: 2023-12-08 — End: ?

## 2023-12-08 MED ORDER — FREESTYLE LIBRE 3 PLUS SENSOR MISC: 1.0000 | 5 refills | Status: DC

## 2023-12-08 MED ORDER — LEVETIRACETAM 500 MG PO TABS: 500.0000 mg | ORAL_TABLET | Freq: Two times a day (BID) | ORAL | 1 refills | Status: AC

## 2023-12-08 MED ORDER — ISOSORBIDE MONONITRATE ER 60 MG PO TB24: 60.0000 mg | ORAL_TABLET | Freq: Two times a day (BID) | ORAL | 6 refills | Status: DC

## 2023-12-08 MED ORDER — GLUCAGON EMERGENCY 1 MG IJ KIT: 1.0000 mg | PACK | INTRAMUSCULAR | 10 refills | Status: DC | PRN

## 2023-12-08 MED ORDER — HYDROCODONE-ACETAMINOPHEN 7.5-325 MG PO TABS: 1.0000 | ORAL_TABLET | Freq: Three times a day (TID) | ORAL | 0 refills | Status: AC

## 2023-12-08 MED ORDER — ROSUVASTATIN CALCIUM 10 MG PO TABS: 10.0000 mg | ORAL_TABLET | Freq: Every day | ORAL | 1 refills | Status: DC

## 2023-12-08 MED ORDER — DULOXETINE HCL 60 MG PO CPEP: 60.0000 mg | ORAL_CAPSULE | Freq: Every day | ORAL | 1 refills | Status: AC

## 2023-12-08 MED ORDER — FUROSEMIDE 40 MG PO TABS: ORAL_TABLET | ORAL | 1 refills | Status: AC

## 2023-12-08 MED ORDER — GLUCAGON EMERGENCY 1 MG IJ KIT: 1.0000 mg | PACK | INTRAMUSCULAR | 10 refills | Status: AC | PRN

## 2023-12-08 MED ORDER — LAMOTRIGINE 150 MG PO TABS: 150.0000 mg | ORAL_TABLET | Freq: Every day | ORAL | 1 refills | Status: AC

## 2023-12-08 MED ORDER — EMPAGLIFLOZIN 25 MG PO TABS: 25.0000 mg | ORAL_TABLET | Freq: Every day | ORAL | 1 refills | Status: AC

## 2023-12-08 MED ORDER — DEXLANSOPRAZOLE 60 MG PO CPDR: 1.0000 | DELAYED_RELEASE_CAPSULE | Freq: Every day | ORAL | 1 refills | Status: AC

## 2023-12-08 MED ORDER — CELECOXIB 200 MG PO CAPS: 200.0000 mg | ORAL_CAPSULE | Freq: Every day | ORAL | 1 refills | Status: AC

## 2023-12-08 MED ORDER — BUSPIRONE HCL 10 MG PO TABS: 10.0000 mg | ORAL_TABLET | Freq: Two times a day (BID) | ORAL | 5 refills | Status: AC | PRN

## 2023-12-08 MED ORDER — CARVEDILOL 3.125 MG PO TABS: ORAL_TABLET | ORAL | 1 refills | Status: AC

## 2023-12-08 MED ORDER — BUTALBITAL-APAP-CAFFEINE 50-325-40 MG PO TABS: 1.0000 | ORAL_TABLET | Freq: Four times a day (QID) | ORAL | 0 refills | Status: DC | PRN

## 2023-12-08 NOTE — Addendum Note (Signed)
 Addended by: Wavie Hashimi, [name] on: 12/08/2023 03:51 PM   Modules accepted: Orders

## 2023-12-08 NOTE — Patient Instructions (Signed)
 Glucagon  Injection What is this medication? GLUCAGON  (GLU ka gon) treats very low blood sugar (severe hypoglycemia) in an emergency. Call emergency services after using this medication. You may need additional treatment. Glucagon  works by helping your liver release stored sugar into your blood, which raises your blood sugar (glucose) levels. This medicine may be used for other purposes; ask your health care provider or pharmacist if you have questions. COMMON BRAND NAME(S): GlucaGen, Glucagon , Gvoke, Gvoke HypoPen , Gvoke PFS  What should I tell my care team before I take this medication? They need to know if you have any of these conditions: Eating less due to illness, surgery, dieting, or any other reason Heart disease Low adrenal gland function Pancreatic tumor Pheochromocytoma Poor nutrition An unusual or allergic reaction to glucagon , other medications, foods, dyes, or preservatives Pregnant or trying to get pregnant Breast-feeding How should I use this medication? This medication is injected under the skin or into a muscle. When this medication is injected into a vein, it is given by a care team in a hospital or clinic setting. If you get this medication at home, you will be taught how to prepare and give it. Use exactly as directed. Do not take your medication more often than directed. It is important that you put your used injectors, needles, and syringes in a special sharps container. Do not put them in a trash can. If you do not have a sharps container, call your pharmacist or care team to get one. This medication comes with INSTRUCTIONS FOR USE. Ask your pharmacist for directions on how to use this medication. Read the information carefully. Talk to your pharmacist or care team if you have questions. Talk to your care team about the use of this medication in children. While this medication may be prescribed for selected conditions, precautions do apply. Overdosage: If you think you have  taken too much of this medicine contact a poison control center or emergency room at once. NOTE: This medicine is only for you. Do not share this medicine with others. What if I miss a dose? This does not apply. This medication is not for regular use. What may interact with this medication? Interactions are not expected. This list may not describe all possible interactions. Give your health care provider a list of all the medicines, herbs, non-prescription drugs, or dietary supplements you use. Also tell them if you smoke, drink alcohol , or use illegal drugs. Some items may interact with your medicine. What should I watch for while using this medication? If you receive this medication as part of a diagnostic test, follow your care team's advice for eating and drinking after the procedure. If you have this kit to help treat low blood sugar: Always get immediate medical help after receiving an injection of this medication. This is very important. Do this even if you respond to the medication and are alert. Keep this kit with you at all times. Wear a medical identification bracelet or chain to say you have diabetes, and carry a card that lists all your medications. Show your family members and others where you keep this kit. Make sure that you and your family or caregiver know how to use this kit the right way before you need it. They need to know how to use it before you need it. Learn how to check your blood sugar. Learn the symptoms of low and high blood sugar and how to manage them. Always carry a quick-source of sugar with you in case you  have symptoms of low blood sugar. Examples include hard sugar candy or glucose tablets. Make sure others know that you can choke if you eat or drink when you develop serious symptoms of low blood sugar, such as seizures or unconsciousness. They must get medical help at once. Also, remind others that they may need to give you this medication injection before medical  help is available. A repeat injection may be needed while waiting for medical help. After you are alert and can swallow after an injection of this medication, you should eat or drink some carbohydrates to prevent continued low blood sugar. Do not drive or operate machinery until you are alert and have eaten sugar or a sugar-sweetened product such as a regular soft drink or fruit juice. What side effects may I notice from receiving this medication? Side effects that you should report to your care team as soon as possible: Allergic reactions--skin rash, itching, hives, swelling of the face, lips, tongue, or throat Side effects that usually do not require medical attention (report to your care team if they continue or are bothersome): Nausea Vomiting This list may not describe all possible side effects. Call your doctor for medical advice about side effects. You may report side effects to FDA at 1-800-FDA-1088. Where should I keep my medication? Keep out of the reach of children and pets. Store at room temperature between 20 and 25 degrees C (68 and 77 degrees F). Store in the original container. Protect from light. Do not refrigerate or freeze. Get rid of any unused medication after the expiration date. Always replace your medication before it expires. Get rid of any unused medication after opening and preparing for use. Do not store for later use. To get rid of medications that are no longer needed or have expired: Take the medication to a medication take-back program. Check with your pharmacy or law enforcement to find a location. If you cannot return the medication, ask your pharmacist or care team how to get rid of this medication safely. NOTE: This sheet is a summary. It may not cover all possible information. If you have questions about this medicine, talk to your doctor, pharmacist, or health care provider.  2024 [employer] (2021-01-14 00:00:00)

## 2023-12-08 NOTE — Progress Notes (Signed)
 Subjective:    Patient ID: [name], female    DOB: July 13, 1955, 68 y.o.   MRN: [id number]   Chief Complaint: medical management of chronic issues     HPI:  [name] is a 67 y.o. who identifies as a female who was assigned female at birth.   Social history: Lives with: her niece stays with her alot Work history: disability   Comes in today for follow up of the following chronic medical issues:  1. Essential hypertension No c/o chest pain, sob or headache. Does not check blood pressure at home very often. BP Readings from Last 3 Encounters:  11/27/23 126/81  11/06/23 (!) 140/93  10/09/23 134/83     2. Chronic diastolic CHF (congestive heart failure) (HCC) Last saw cardiology on 08/27/23. Has been having chest pain and they dont know why. They are currently running more tests.  3. Obstructive sleep apnea syndrome Does not wear CPAP daily  4. Diverticulosis No flare ups since her last follow up  5. Gastroesophageal reflux disease with esophagitis without hemorrhage Is on dexilant  daily and is doing well right now  6. Hyperlipidemia with target LDL less than 100 Does not really watch diet and does no dedicated exercise. Lab Results  Component Value Date   CHOL 154 06/16/2023   HDL 69 06/16/2023   LDLCALC 71 06/16/2023   LDLDIRECT 142.1 04/22/2007   TRIG 70 06/16/2023   CHOLHDL 2.2 06/16/2023    7. Hypokalemia Denies any muscle cramps Lab Results  Component Value Date   K 4.5 11/24/2023     8. GAD (generalized anxiety disorder) Is on lexapro  and cymbalta  combination. Ays she has been doing ok    12/08/2023    2:31 PM 11/06/2023    8:50 AM 09/11/2023    2:15 PM 03/19/2023    3:03 PM  GAD 7 : Generalized Anxiety Score  Nervous, Anxious, on Edge 0 0 0 0  Control/stop worrying 0 0 0 0  Worry too much - different things 0 0 0 0  Trouble relaxing 0 0 0 2  Restless 0 0 0 0  Easily annoyed or irritable 0 0 0 0  Afraid - awful might happen 0 0  0 0  Total GAD 7 Score 0 0 0 2  Anxiety Difficulty Not difficult at all Not difficult at all Not difficult at all Not difficult at all      9. Seizures (HCC) Is still on keppra - no recent seizure activity. But she did pass out at church Sunday.   10. Recurrent major depressive disorder, in partial remission (HCC) Again is on cymbalta and lexapro.    12/08/2023    2:30 PM 11/06/2023    8:50 AM 10/09/2023   10:28 AM  Depression screen PHQ 2/9  Decreased Interest 0 0 0  Down, Depressed, Hopeless 0 0 0  PHQ - 2 Score 0 0 0  Altered sleeping 3 0 0  Tired, decreased energy 3 0 0  Change in appetite 0 0 0  Feeling bad or failure about yourself  0 0 0  Trouble concentrating 3 0 3  Moving slowly or fidgety/restless 0 0 0  Suicidal thoughts 0 0 0  PHQ-9 Score 9 0 3  Difficult doing work/chores Not difficult at all Not difficult at all Not difficult at all      11 . Chronic pain syndrome She is on norco 7.5/325 TID. She has had dilaudid  and oxycodone  after surgeries recently, but I was informed  of this prior to surgery. Pain assessment: Cause of pain- DDD Pain location- lower back Pain on scale of 1-10- 7/10 currently Frequency- daily What increases pain-activity What makes pain Better-nothing Effects on ADL - none Any change in general medical condition-none  Current opioids rx- norco 7.5/325 tid # meds rx- 90 Effectiveness of current meds-helps Adverse reactions from pain meds-none Morphine equivalent- 15MME  Pill count performed-No Last drug screen - long time ( high risk q59m, moderate risk q66m, low risk yearly ) Urine drug screen today- Yes Was the [facility] reviewed- yes  If yes were their any concerning findings? - no  Patient got some roxicodone  from another provider on 12/04/23. She had thumb surgery and ortho was prescribing pain meds. Overdose risk: 1    09/12/2021    3:09 PM  Opioid Risk   Alcohol  0  Illegal Drugs 0  Rx Drugs 0  Alcohol  0  Illegal Drugs 0  Rx  Drugs 0  Age between 16-45 years  0  History of Preadolescent Sexual Abuse 3  Psychological Disease 2  ADD Negative  OCD Negative  Bipolar Positive  Depression 1  Opioid Risk Tool Scoring 6  Opioid Risk Interpretation Moderate Risk     Pain contract signed on:   12. Primary insomnia Is on no sleep aids due to taking pain medication. Sleeps well some nights and not so well other nights.  13. Prediabetes Patient is on jardiance , but has always had good HGBA1c. Patient wears herlene because she has fluctuations in blood sugar. Blood sugar dropped to 50's wile she was in office and we had to give her a snack. But then her blood sugar will shoot up into the 200's Lab Results  Component Value Date   HGBA1C 5.7 (H) 07/27/2023     New complaints: None today Allergies  Allergen Reactions   Codeine  Other (See Comments)    I will have a heart attack.   Morphine And Codeine  Other (See Comments)    It will cause me to have a heart attack.   Ambien  [Zolpidem  Tartrate] Nausea And Vomiting   Clonidine  Derivatives Nausea And Vomiting    GERD - caused acid reflux per pt   Metformin  And Related Nausea And Vomiting    Cramping from metformin    Lyrica  [Pregabalin ] Swelling and Other (See Comments)    Weight gain   Neurontin [Gabapentin] Other (See Comments)    Causes elevated LFTs    Outpatient Encounter Medications as of 12/08/2023  Medication Sig   Accu-Chek Softclix Lancets lancets test blood sugar up to 4 times a day as directed. Dx R73.03   acyclovir  ointment (ZOVIRAX ) 5 % apply 1 application topically every 3 (three) hours as needed (outbreaks).   albuterol  (VENTOLIN  HFA) 108 (90 Base) MCG/ACT inhaler inhale 2 puffs every 6 hours as needed for shortness of breath and wheezing.   Alcohol  Swabs  (B-D SINGLE USE SWABS  REGULAR) PADS USE 1 PAD DAILY WHEN CHECKING BLOOD SUGAR. R73.03   blood glucose meter kit and supplies Dispense based on patient and insurance preference. Use up to  four times daily as directed. (FOR ICD-10 E10.9, E11.9).   busPIRone  (BUSPAR ) 10 MG tablet Take 1 tablet (10 mg total) by mouth 2 (two) times daily as needed.   butalbital -acetaminophen -caffeine  (FIORICET ) 50-325-40 MG tablet take 1 tablet by mouth every 6 (six) hours as needed for headache.   butalbital -acetaminophen -caffeine  (FIORICET ) 50-325-40 MG tablet take 1 tablet by mouth every 6 (six) hours as needed for headache.  carvedilol  (COREG ) 3.125 MG tablet TAKE 1/2 (0.5) TABLET TWICE A DAY WITH A MEAL; BLOOD PRESSURE OVER 150 BEFORE TAKING. (Patient taking differently: 3.125 mg. TAKE 1/2 (0.5) TABLET TWICE A DAY WITH A MEAL; BLOOD PRESSURE OVER 150 BEFORE TAKING. Taking PRN)   celecoxib  (CELEBREX ) 200 MG capsule Take 1 capsule (200 mg total) by mouth daily.   Continuous Glucose Sensor (FREESTYLE LIBRE 3 PLUS SENSOR) MISC Apply 1 each topically every 14 (fourteen) days. Change sensor every 15 days.   dexlansoprazole  (DEXILANT ) 60 MG capsule Take 1 capsule (60 mg total) by mouth daily.   Dextromethorphan-GG-APAP (CORICIDIN HBP COLD/COUGH/FLU) 10-200-325 MG/15ML LIQD Take 30 mLs by mouth every 4 (four) hours as needed (cough).   diclofenac  Sodium (VOLTAREN ) 1 % GEL APPLY 4 GRAMS TOPICALLY 4 TIMES A DAY. (Patient taking differently: Apply 4 g topically 4 (four) times daily as needed (pain).)   DULoxetine  (CYMBALTA ) 60 MG capsule Take 1 capsule (60 mg total) by mouth at bedtime.   escitalopram  (LEXAPRO ) 20 MG tablet Take 1 tablet (20 mg total) by mouth daily.   estradiol  (ESTRACE ) 0.1 MG/GM vaginal cream Place 1 Applicatorful vaginally 3 (three) times a week.   FIBER PO Take 1 tablet by mouth in the morning and at bedtime.   fludrocortisone  (FLORINEF ) 0.1 MG tablet take 1 tablet (100mcg total) by mouth every other day.   fluticasone  (FLONASE ) 50 MCG/ACT nasal spray USE 1 SPRAY IN EACH NOSTRIL ONCE DAILY. (Patient taking differently: Place 1 spray into both nostrils daily as needed for allergies.)    fluticasone  furoate-vilanterol (BREO ELLIPTA ) 100-25 MCG/ACT AEPB Inhale 1 puff by mouth once daily   furosemide  (LASIX ) 40 MG tablet TAKE 1 TABLET DAILY AS NEEDED FOR EXCESSIVE FLUID.   Glucagon , rDNA, (GLUCAGON  EMERGENCY) 1 MG KIT INJECT 1 MG INTO THE SKIN AS NEEDED   glucose blood (ACCU-CHEK GUIDE) test strip TEST BLOOD SUGAR UP TO 4 TIMES A DAY AS DIRECTED. R73.03   hydrALAZINE  (APRESOLINE ) 25 MG tablet take 1 tablet by mouth 3 times daily as needed (for severe hypertension/systolic number 170 or greater).   HYDROcodone -acetaminophen  (NORCO) 7.5-325 MG tablet Take 1 tablet by mouth in the morning, at noon, and at bedtime.   ipratropium (ATROVENT ) 0.02 % nebulizer solution USE 1 VIAL ( ) IN NEBULIZER EVERY 6 HOURS AS NEEDED FOR WHEEZING OR SHORTNESS OF BREATH.   isosorbide  mononitrate (IMDUR ) 60 MG 24 hr tablet Take 1 tablet (60 mg total) by mouth 2 (two) times daily.   JARDIANCE  25 MG TABS tablet Take 1 tablet by mouth once daily   lamoTRIgine  (LAMICTAL ) 150 MG tablet take 1 tablet (150 MILLIGRAM total) by mouth daily.   levETIRAcetam  (KEPPRA ) 500 MG tablet TAKE (1) TABLET TWICE DAILY.   levocetirizine (XYZAL ) 5 MG tablet take 1 tablet by mouth every morning.   lidocaine  (LIDODERM ) 5 % place 1 patch onto the skin daily. remove & discard patch within 12 hours or as directed by md   lidocaine  (XYLOCAINE ) 5 % ointment apply to affected area 3 times a day as needed for mild or moderate pain.   methocarbamol  (ROBAXIN ) 500 MG tablet take 1 tablet (500 milligram total) by mouth 4 (four) times daily.   nitroGLYCERIN  (NITROSTAT ) 0.4 MG SL tablet place one (1) tablet under tongue every 5 minutes up to (3) doses as needed for chest pain.   ondansetron  (ZOFRAN ) 4 MG tablet take 1 tablet by mouth every 8 hours as needed for nausea and vomiting.   oxyCODONE  (ROXICODONE ) 5 MG immediate  release tablet Take 1 tablet (5 mg total) by mouth every 6 (six) hours as needed.   polyethylene glycol (MIRALAX  /  GLYCOLAX ) 17 g packet Take 17 g by mouth 2 (two) times daily.   promethazine -dextromethorphan (PROMETHAZINE -DM) 6.25-15 MG/5ML syrup Take 5 mLs by mouth 3 (three) times daily as needed for cough.   rosuvastatin  (CRESTOR ) 10 MG tablet Take 1 tablet (10 mg total) by mouth daily.   traZODone  (DESYREL ) 150 MG tablet Take 1 tablet (150 mg total) by mouth at bedtime.   valACYclovir  (VALTREX ) 1000 MG tablet take 1 tablet (1,000 MILLIGRAM total) by mouth 3 (three) times daily as needed (with active outbreaks.).   valACYclovir  (VALTREX ) 500 MG tablet take (1) tablet twice daily.   No facility-administered encounter medications on file as of 12/08/2023.    Past Surgical History:  Procedure Laterality Date   BACK SURGERY     BREAST EXCISIONAL BIOPSY Right    1990s   BREAST SURGERY     lumpectomy   CARDIAC CATHETERIZATION  10/06/2011   CATARACT EXTRACTION W/PHACO Right 07/31/2017   Procedure: CATARACT EXTRACTION PHACO AND INTRAOCULAR LENS PLACEMENT (IOC);  Surgeon: Harrie Agent, MD;  Location: [facility];  Service: Ophthalmology;  Laterality: Right;  CDE: 2.33   CATARACT EXTRACTION W/PHACO Left 08/14/2017   Procedure: CATARACT EXTRACTION PHACO AND INTRAOCULAR LENS PLACEMENT (IOC);  Surgeon: Harrie Agent, MD;  Location: [facility];  Service: Ophthalmology;  Laterality: Left;  CDE: 2.74   CHOLECYSTECTOMY     CYSTOSCOPY     stone   DIAGNOSTIC LAPAROSCOPY     laparoscopic cholecystectomy   DOPPLER ECHOCARDIOGRAPHY  2009   ESOPHAGOGASTRODUODENOSCOPY N/A 10/31/2020   Procedure: ESOPHAGOGASTRODUODENOSCOPY (EGD);  Surgeon: Tanda Locus, MD;  Location: [facility];  Service: General;  Laterality: N/A;   EYE SURGERY Bilateral 08/14/2017   cataract removal   FINGER ARTHROPLASTY Left 11/27/2023   Procedure: ARTHROPLASTY, FINGER;  Surgeon: Arlinda Buster, MD;  Location:  SURGERY CENTER;  Service: Orthopedics;  Laterality: Left;  LEFT THUMB CARPOMATACARPAL JOINT ARTHROPLASTY WITH INTERNAL BRACE AND  METACARPOPHALANGEAL JOINT CAPSULODESIS PINNING   GLUTEUS MINIMUS REPAIR Left 11/04/2022   Procedure: LEFT GLUTEUS MEDIUS REPAIR;  Surgeon: Genelle Standing, MD;  Location: [facility];  Service: Orthopedics;  Laterality: Left;   head up tilt table testing  06/15/2007   Danelle Birmingham   HEMORRHOID SURGERY     HERNIA REPAIR     insertion of implatable loop recorder  08/11/2007   Danelle Birmingham   POSTERIOR CERVICAL FUSION/FORAMINOTOMY N/A 12/19/2013   Procedure: RIGHT C3-4.C4-5 AND C5-6 FORAMINOTOMIES;  Surgeon: Agent FORBES Better, MD;  Location: [facility] OR;  Service: Orthopedics;  Laterality: N/A;   TOTAL HIP ARTHROPLASTY Left 01/31/2020   Procedure: LEFT TOTAL HIP ARTHROPLASTY ANTERIOR APPROACH;  Surgeon: Vernetta Lonni GRADE, MD;  Location: [facility];  Service: Orthopedics;  Laterality: Left;   TOTAL SHOULDER ARTHROPLASTY Right 11/15/2019   Procedure: RIGHT TOTAL SHOULDER ARTHROPLASTY;  Surgeon: Addie Cordella Hamilton, MD;  Location: [facility];  Service: Orthopedics;  Laterality: Right;   TOTAL SHOULDER REVISION Right 06/11/2021   Procedure: RIGHT SHOULDER CONVERSION TOTAL SHOULDER ARTHROPLASTY to REVERSE TOTAL SHOULDER ARTHROPLASTY;  Surgeon: Addie Cordella Hamilton, MD;  Location: [facility] OR;  Service: Orthopedics;  Laterality: Right;   TUBAL LIGATION     UPPER GASTROINTESTINAL ENDOSCOPY     XI ROBOTIC ASSISTED HIATAL HERNIA REPAIR N/A 10/31/2020   Procedure: XI ROBOTIC ASSISTED HIATAL HERNIA REPAIR WITH PARTIAL FUNDOPLICATION;  Surgeon: Tanda Locus, MD;  Location: [facility];  Service: General;  Laterality: N/A;    Family History  Problem Relation Age of Onset   Heart attack Father    Mental illness Father    Mental illness Mother    Heart attack Brother        stents   Alcohol  abuse Brother    Heart disease Brother    Drug abuse Brother    Diabetes Brother    Colon cancer Maternal Aunt    Cirrhosis Brother    Stomach cancer Neg Hx    Esophageal cancer Neg Hx    Rectal cancer Neg Hx       Controlled substance  contract: 04/02/22     Review of Systems  Constitutional:  Negative for diaphoresis.  Eyes:  Negative for pain.  Respiratory:  Negative for shortness of breath.   Cardiovascular:  Negative for chest pain, palpitations and leg swelling.  Gastrointestinal:  Negative for abdominal pain.  Endocrine: Negative for polydipsia.  Skin:  Negative for rash.  Neurological:  Negative for dizziness, weakness and headaches.  Hematological:  Does not bruise/bleed easily.  All other systems reviewed and are negative.      Objective:   Physical Exam Vitals and nursing note reviewed.  Constitutional:      General: She is not in acute distress.    Appearance: Normal appearance. She is well-developed.  HENT:     Head: Normocephalic.     Right Ear: Tympanic membrane normal.     Left Ear: Tympanic membrane normal.     Nose: Nose normal.     Mouth/Throat:     Mouth: Mucous membranes are moist.  Eyes:     Pupils: Pupils are equal, round, and reactive to light.  Neck:     Vascular: No carotid bruit or JVD.  Cardiovascular:     Rate and Rhythm: Normal rate and regular rhythm.     Heart sounds: Normal heart sounds.  Pulmonary:     Effort: Pulmonary effort is normal. No respiratory distress.     Breath sounds: Normal breath sounds. No wheezing or rales.  Chest:     Chest wall: No tenderness.  Abdominal:     General: Bowel sounds are normal. There is no distension or abdominal bruit.     Palpations: Abdomen is soft. There is no hepatomegaly, splenomegaly, mass or pulsatile mass.     Tenderness: There is no abdominal tenderness.  Musculoskeletal:        General: Normal range of motion.     Cervical back: Normal range of motion and neck supple.     Left lower leg: Edema present.     Comments: Moves slowly from sitting to standing Gait slow and steady  Left thumb is in a thumb spica cast.  Lymphadenopathy:     Cervical: No cervical adenopathy.  Skin:    General: Skin is warm and dry.   Neurological:     Mental Status: She is alert and oriented to person, place, and time.     Deep Tendon Reflexes: Reflexes are normal and symmetric.  Psychiatric:        Behavior: Behavior normal.        Thought Content: Thought content normal.        Judgment: Judgment normal.    Ht 5' 2 (1.575 m)   BMI 24.03 kg/m   BP (!) 145/77   Pulse 80   Temp (!) 97 F (36.1 C) (Skin)   Ht 5' 2 (1.575 m)   Wt 131 lb 6.4 oz (59.6  kg)   BMI 24.03 kg/m   HBA1c 5.6%      Assessment & Plan:  [name] comes in today with chief complaint of Medical Management of Chronic Issues   Diagnosis and orders addressed:  1. Essential hypertension Low sodium diet - carvedilol  (COREG ) 3.125 MG tablet; TAKE 1/2 (0.5) TABLET TWICE A DAY WITH A MEAL; BLOOD PRESSURE OVER 150 BEFORE TAKING.  Dispense: 90 tablet; Refill: 1 - furosemide  (LASIX ) 40 MG tablet; TAKE 1 TABLET DAILY AS NEEDED FOR EXCESSIVE FLUID.  Dispense: 30 tablet; Refill: 1  2. Chronic diastolic CHF (congestive heart failure) (HCC) Keep follow up with cardiology  3. Obstructive sleep apnea syndrome Wear cpapa  4. Diverticulosis Watch diet to prevent flare up  5. Gastroesophageal reflux disease with esophagitis without hemorrhage Avoid spicy foods Do not eat 2 hours prior to bedtime - dexlansoprazole  (DEXILANT ) 60 MG capsule; Take 1 capsule (60 mg total) by mouth daily.  Dispense: 90 capsule; Refill: 1  6. Hyperlipidemia with target LDL less than 100 Low ftadiet - rosuvastatin  (CRESTOR ) 10 MG tablet; Take 1 tablet (10 mg total) by mouth daily.  Dispense: 90 tablet; Refill: 1  7. Hypokalemia Labs pending  8. GAD (generalized anxiety disorder) Stress management - lamoTRIgine  (LAMICTAL ) 150 MG tablet; Take 1 tablet (150 mg total) by mouth daily.  Dispense: 30 tablet; Refill: 1  9. Seizures (HCC) Report any seizure activity - levETIRAcetam  (KEPPRA ) 500 MG tablet; Take 1 tablet (500 mg total) by mouth 2 (two) times  daily.  Dispense: 180 tablet; Refill: 1  10. Recurrent major depressive disorder, in partial remission (HCC) - busPIRone  (BUSPAR ) 10 MG tablet; Take 1 tablet (10 mg total) by mouth 2 (two) times daily as needed.  Dispense: 60 tablet; Refill: 5 - DULoxetine  (CYMBALTA ) 60 MG capsule; Take 1 capsule (60 mg total) by mouth at bedtime.  Dispense: 90 capsule; Refill: 1 - escitalopram  (LEXAPRO ) 20 MG tablet; Take 1 tablet (20 mg total) by mouth daily.  Dispense: 90 tablet; Refill: 1  11. Chronic pain syndrome Discussed pain contract and fact cannot get pain meds from another provider. 12. Primary insomnia Bedtime routine - traZODone  (DESYREL ) 150 MG tablet; Take 1 tablet (150 mg total) by mouth at bedtime.  Dispense: 90 tablet; Refill: 1  13. Dyspareunia in female - estradiol  (ESTRACE ) 0.1 MG/GM vaginal cream; Place 1 Applicatorful vaginally 3 (three) times a week.  Dispense: 42.5 g; Refill: 3  14. Herpes simplex virus (HSV) infection - valACYclovir  (VALTREX ) 1000 MG tablet; Take 1 tablet (1,000 mg total) by mouth 3 (three) times daily as needed (with active outbreaks.).  Dispense: 21 tablet; Refill: 0  15. Pre-diabetes Watch carbs in diet Patient has a lot of low blood sugars Stop jardiance  for now - empagliflozin  (JARDIANCE ) 25 MG TABS tablet; Take 1 tablet (25 mg total) by mouth daily.  Dispense: 90 tablet; Refill: 1  16. SPONDYLOLISTHESIS - HYDROcodone -acetaminophen  (NORCO) 7.5-325 MG tablet; Take 1 tablet by mouth in the morning, at noon, and at bedtime.  Dispense: 90 tablet; Refill: 0 - HYDROcodone -acetaminophen  (NORCO) 7.5-325 MG tablet; Take 1 tablet by mouth in the morning, at noon, and at bedtime.  Dispense: 90 tablet; Refill: 0 - HYDROcodone -acetaminophen  (NORCO) 7.5-325 MG tablet; Take 1 tablet by mouth in the morning, at noon, and at bedtime.  Dispense: 90 tablet; Refill: 0   Labs pending Health Maintenance reviewed Diet and exercise encouraged  Follow up plan: 3 months-  pain management   [name] Gladis, FNP

## 2023-12-09 LAB — CBC WITH DIFFERENTIAL/PLATELET
Basophils Absolute: 0 x10E3/uL (ref 0.0–0.2)
Basos: 0 %
EOS (ABSOLUTE): 0.1 x10E3/uL (ref 0.0–0.4)
Eos: 0 %
Hematocrit: 43.9 % (ref 34.0–46.6)
Hemoglobin: 14.3 g/dL (ref 11.1–15.9)
Immature Grans (Abs): 0.1 x10E3/uL (ref 0.0–0.1)
Immature Granulocytes: 1 %
Lymphocytes Absolute: 2.6 x10E3/uL (ref 0.7–3.1)
Lymphs: 15 %
MCH: 31.6 pg (ref 26.6–33.0)
MCHC: 32.6 g/dL (ref 31.5–35.7)
MCV: 97 fL (ref 79–97)
Monocytes Absolute: 1.5 x10E3/uL — ABNORMAL HIGH (ref 0.1–0.9)
Monocytes: 9 %
Neutrophils Absolute: 12.6 x10E3/uL — ABNORMAL HIGH (ref 1.4–7.0)
Neutrophils: 75 %
Platelets: 207 x10E3/uL (ref 150–450)
RBC: 4.53 x10E6/uL (ref 3.77–5.28)
RDW: 13.1 % (ref 11.7–15.4)
WBC: 16.9 x10E3/uL — ABNORMAL HIGH (ref 3.4–10.8)

## 2023-12-09 LAB — CMP14+EGFR
ALT: 13 IU/L (ref 0–32)
AST: 19 IU/L (ref 0–40)
Albumin: 4 g/dL (ref 3.9–4.9)
Alkaline Phosphatase: 114 IU/L (ref 44–121)
BUN/Creatinine Ratio: 24 (ref 12–28)
BUN: 18 mg/dL (ref 8–27)
Bilirubin Total: 0.2 mg/dL (ref 0.0–1.2)
CO2: 22 mmol/L (ref 20–29)
Calcium: 9.5 mg/dL (ref 8.7–10.3)
Chloride: 107 mmol/L — AB (ref 96–106)
Creatinine, Ser: 0.76 mg/dL (ref 0.57–1.00)
Globulin, Total: 2.8 g/dL (ref 1.5–4.5)
Sodium: 147 mmol/L — AB (ref 134–144)
Total Protein: 6.8 g/dL (ref 6.0–8.5)
eGFR: 86 mL/min/1.73 (ref >59)

## 2023-12-09 LAB — LIPID PANEL
Cholesterol, Total: 149 mg/dL (ref 100–199)
HDL: 60 mg/dL (ref >39)
LDL CALC COMMENT:: 2.5 ratio (ref 0.0–4.4)
LDL Chol Calc (NIH): 71 mg/dL (ref 0–99)
Triglycerides: 99 mg/dL (ref 0–149)
VLDL Cholesterol Cal: 18 mg/dL (ref 5–40)

## 2023-12-09 LAB — MICROALBUMIN / CREATININE URINE RATIO
Creatinine, Urine: 64.6 mg/dL
Microalb/Creat Ratio: 18 mg/g{creat} (ref 0–29)
Microalbumin, Urine: 11.4 ug/mL

## 2023-12-10 ENCOUNTER — Ambulatory Visit: Payer: Self-pay | Admitting: Nurse Practitioner

## 2023-12-10 ENCOUNTER — Ambulatory Visit: Admitting: Orthopedic Surgery

## 2023-12-10 ENCOUNTER — Other Ambulatory Visit (INDEPENDENT_AMBULATORY_CARE_PROVIDER_SITE_OTHER): Payer: Self-pay

## 2023-12-10 DIAGNOSIS — M1812 Unilateral primary osteoarthritis of first carpometacarpal joint, left hand: Secondary | ICD-10-CM

## 2023-12-10 NOTE — Progress Notes (Signed)
   [name] - 68 y.o. female MRN [id number]  Date of birth: 1955-09-24  Office Visit Note: Visit Date: 12/10/2023 PCP: Gladis Mustard, FNP Referred by: Gladis, [name], *  Subjective:  HPI: [name] is a 68 y.o. female who presents today for follow up 2 weeks status post Left thumb carpometacarpal arthritis with associated MCP hyperextension .  She is doing well overall, pain is well-controlled.  Has been compliant with splinting as instructed.  Pertinent ROS were reviewed with the patient and found to be negative unless otherwise specified above in HPI.   Assessment & Plan: Visit Diagnoses:  1. Primary osteoarthritis of first carpometacarpal joint of left hand     Plan: She is doing well overall, pin will remain in place for additional 2 weeks for MCP capsulodesis and pinning.  Prefabricated thumb spica splint was placed today over the pin, she will be seen by occupational therapy later this week for fabrication of a removable orthosis.  Follow-up myself in 2 weeks.  Follow-up: No follow-ups on file.   Meds & Orders: No orders of the defined types were placed in this encounter.   Orders Placed This Encounter  Procedures   XR Wrist Complete Left     Procedures: No procedures performed       Objective:   Vital Signs: There were no vitals taken for this visit.  Ortho Exam Left wrist: - Well-healed incision at the glabrous/nonglabrous juncture over the [facility] region of the thumb - Thumb circumduction without significant pain or crepitus - Hand is warm well-perfused, sensation intact in all distributions including DRSN - Pin site at the MCP region clean dry and intact   Imaging: XR Wrist Complete Left Result Date: 12/10/2023 X-ray of the left wrist demonstrate stable appearance of the wrist status post trapeziectomy, thumb metacarpal remains in appropriate abducted position    Enid Maultsby Estela) Yanna Leaks, M.D. [facility], Hand Surgery

## 2023-12-11 ENCOUNTER — Other Ambulatory Visit: Payer: Self-pay | Admitting: Orthopedic Surgery

## 2023-12-11 MED ORDER — OXYCODONE HCL 5 MG PO TABS: 5.0000 mg | ORAL_TABLET | Freq: Four times a day (QID) | ORAL | 0 refills | Status: DC | PRN

## 2023-12-11 NOTE — Telephone Encounter (Signed)
 LMOM her medication was sent in

## 2023-12-14 ENCOUNTER — Encounter: Admitting: Occupational Therapy

## 2023-12-14 NOTE — Therapy (Unsigned)
 OUTPATIENT OCCUPATIONAL THERAPY ORTHO EVALUATION  Patient Name: [name] MRN: [id number] DOB:[birth year], 68 y.o., female Today's Date: 12/16/2023  PCP: Gladis Annita Quant FNP REFERRING PROVIDER: Dr. Arlinda  END OF SESSION:  OT End of Session - 12/16/23 1234     Visit Number 1    Number of Visits 25    Date for OT Re-Evaluation 03/09/24    Authorization Type 12 weeks    Authorization - Visit Number 1    Progress Note Due on Visit 10    OT Start Time 1423    OT Stop Time 1534    OT Time Calculation (min) 71 min          Past Medical History:  Diagnosis Date   Allergy    Anemia    Anginal pain (HCC)    last time    Anxiety    Arthritis    RHEUMATOID   Asthma    Bipolar 1 disorder (HCC)    Blood transfusion without reported diagnosis    x 3   Cataracts, bilateral 07/2017   CHF (congestive heart failure) (HCC)    COPD (chronic obstructive pulmonary disease) (HCC)    Coronary artery disease    reported hx of MI;  Echo 2009 with normal LVF;  Myoview  05/2011: no ischemia   Depression    Diabetes mellitus without complication (HCC)    Dyslipidemia    Dysrhythmia    SVT   Elevated liver enzymes 06/30/2022   Esophageal stricture    Fibromyalgia    GERD (gastroesophageal reflux disease)    H/O hiatal hernia    Head injury, unspecified    Headache    migraines   Herpes simplex infection    History of kidney stones    History of loop recorder 08/10/2017   Managed by Dr. Cathlyn Birmingham   Hyperlipidemia    Hypertension    Insomnia    Myocardial infarction [facility])    age 27   Osteoporosis    Pneumonia    hx   Seizures (HCC)    none in the last 3-4 years as of 11/03/22 per patient   Shortness of breath    Sleep apnea    mild, does not require positive pressure treatment. (02/03/22)   Spinal stenosis of lumbar region    Spondylolisthesis    Status post placement of implantable loop recorder    Supraventricular tachycardia (HCC)    Syncope and  collapse    s/p ILR; no arhythmogenic cause identified   UTI (lower urinary tract infection)    Past Surgical History:  Procedure Laterality Date   BACK SURGERY     BREAST EXCISIONAL BIOPSY Right    1990s   BREAST SURGERY     lumpectomy   CARDIAC CATHETERIZATION  10/06/2011   CATARACT EXTRACTION W/PHACO Right 07/31/2017   Procedure: CATARACT EXTRACTION PHACO AND INTRAOCULAR LENS PLACEMENT (IOC);  Surgeon: Harrie Agent, MD;  Location: [facility];  Service: Ophthalmology;  Laterality: Right;  CDE: 2.33   CATARACT EXTRACTION W/PHACO Left 08/14/2017   Procedure: CATARACT EXTRACTION PHACO AND INTRAOCULAR LENS PLACEMENT (IOC);  Surgeon: Harrie Agent, MD;  Location: [facility];  Service: Ophthalmology;  Laterality: Left;  CDE: 2.74   CHOLECYSTECTOMY     CYSTOSCOPY     stone   DIAGNOSTIC LAPAROSCOPY     laparoscopic cholecystectomy   DOPPLER ECHOCARDIOGRAPHY  2009   ESOPHAGOGASTRODUODENOSCOPY N/A 10/31/2020   Procedure: ESOPHAGOGASTRODUODENOSCOPY (EGD);  Surgeon: Tanda Locus, MD;  Location: [facility];  Service:  General;  Laterality: N/A;   EYE SURGERY Bilateral 08/14/2017   cataract removal   FINGER ARTHROPLASTY Left 11/27/2023   Procedure: ARTHROPLASTY, FINGER;  Surgeon: Arlinda Buster, MD;  Location: [facility];  Service: Orthopedics;  Laterality: Left;  LEFT THUMB CARPOMATACARPAL JOINT ARTHROPLASTY WITH INTERNAL BRACE AND METACARPOPHALANGEAL JOINT CAPSULODESIS PINNING   GLUTEUS MINIMUS REPAIR Left 11/04/2022   Procedure: LEFT GLUTEUS MEDIUS REPAIR;  Surgeon: Genelle Standing, MD;  Location: [facility];  Service: Orthopedics;  Laterality: Left;   head up tilt table testing  06/15/2007   Danelle Birmingham   HEMORRHOID SURGERY     HERNIA REPAIR     insertion of implatable loop recorder  08/11/2007   Danelle Birmingham   POSTERIOR CERVICAL FUSION/FORAMINOTOMY N/A 12/19/2013   Procedure: RIGHT C3-4.C4-5 AND C5-6 FORAMINOTOMIES;  Surgeon: Lynwood FORBES Better, MD;  Location: [facility] OR;  Service: Orthopedics;   Laterality: N/A;   TOTAL HIP ARTHROPLASTY Left 01/31/2020   Procedure: LEFT TOTAL HIP ARTHROPLASTY ANTERIOR APPROACH;  Surgeon: Vernetta Lonni GRADE, MD;  Location: [facility];  Service: Orthopedics;  Laterality: Left;   TOTAL SHOULDER ARTHROPLASTY Right 11/15/2019   Procedure: RIGHT TOTAL SHOULDER ARTHROPLASTY;  Surgeon: Addie Cordella Hamilton, MD;  Location: [facility];  Service: Orthopedics;  Laterality: Right;   TOTAL SHOULDER REVISION Right 06/11/2021   Procedure: RIGHT SHOULDER CONVERSION TOTAL SHOULDER ARTHROPLASTY to REVERSE TOTAL SHOULDER ARTHROPLASTY;  Surgeon: Addie Cordella Hamilton, MD;  Location: [facility];  Service: Orthopedics;  Laterality: Right;   TUBAL LIGATION     UPPER GASTROINTESTINAL ENDOSCOPY     XI ROBOTIC ASSISTED HIATAL HERNIA REPAIR N/A 10/31/2020   Procedure: XI ROBOTIC ASSISTED HIATAL HERNIA REPAIR WITH PARTIAL FUNDOPLICATION;  Surgeon: Tanda Locus, MD;  Location: [facility];  Service: General;  Laterality: N/A;   Patient Active Problem List   Diagnosis Date Noted   Arthritis of carpometacarpal [facility]) joint of left thumb 11/27/2023   Gonorrhea 12/03/2022   Acute cystitis with hematuria 11/26/2022   Tear of left gluteus minimus tendon 11/04/2022   Instability of prosthetic shoulder joint (HCC)    S/P reverse total shoulder arthroplasty, right 06/11/2021   Hypokalemia 11/25/2020   S/P laparoscopic fundoplication 10/31/2020   Status post total replacement of left hip 01/31/2020   Unilateral primary osteoarthritis, left hip 12/21/2019   S/P shoulder replacement, right 11/15/2019   Diverticulosis 04/14/2019   Chronic diastolic CHF (congestive heart failure) (HCC) 04/14/2019   Unstable angina (HCC) 10/20/2017   Chronic pain 09/23/2016   Pre-diabetes 09/02/2016   Recurrent major depressive disorder, in partial remission (HCC) 07/14/2016   Seizures (HCC) 07/14/2016   GAD (generalized anxiety disorder) 07/14/2016   Insomnia 09/05/2014   Allergic rhinitis 09/05/2014   Cervical  spondylosis without myelopathy 12/19/2013    Class: Chronic   Neural foraminal stenosis of cervical spine 12/19/2013   Cervical radiculitis 09/19/2013   Lumbosacral spondylosis without myelopathy 11/16/2012   Postlaminectomy syndrome, lumbar region 11/16/2012   Herpes simplex virus (HSV) infection 10/31/2008   Hyperlipidemia with target LDL less than 100 10/31/2008   Primary hypertension 10/31/2008   GERD 10/31/2008   SPINAL STENOSIS OF LUMBAR REGION 10/31/2008   Osteoporosis 10/31/2008   SPONDYLOLISTHESIS 10/31/2008   SYNCOPE 10/31/2008   Sleep apnea 10/31/2008    ONSET DATE: 11/27/23  REFERRING DIAG: M18.12 (ICD-10-CM) - Primary osteoarthritis of first carpometacarpal joint of left hand    THERAPY DIAG:  Pain in left hand  Stiffness of left hand, not elsewhere classified  Muscle weakness (generalized)  Localized edema  Rationale for Evaluation and Treatment: Rehabilitation  SUBJECTIVE:   SUBJECTIVE STATEMENT: Pt reports she went to the wrong location, that's why she's late. Pt was dipleased about being re-scheduled to a differnt clinic. Pt accompanied by: self  PERTINENT HISTORY:  Pt with L thumb carpometacarpal arthritis with associated MCP hyperextension. pt underwent the following procedures on 11/27/23:  Left thumb carpometacarpal arthroplasty Left tendon transfer abductor pollicis longus to flexor carpi radialis Left partial trapezoid ectomy Left de Quervain release surgery Left thumb MCP capsulodesis and pinning PMHReverse tSA,anxiety, arthritis , bipolar CHF, COPD, CAD depression, DM fribromyalgia, head injury, MI , seizure ,back surg, L THA    PRECAUTIONS: Other: splint at all times, wait to begin ROM after pin removed , Will need to further clarify ROM prec with Dr. Erwin hx of seizures  RED FLAGS: None   WEIGHT BEARING RESTRICTIONS: Yes NWB to LUE  PAIN:  Are you having pain? Yes: NPRS scale: 6/10 Pain location: L hand Pain description: aching,  swollen Aggravating factors: tight cast/ splint Relieving factors: removing cast  FALLS: Has patient fallen in last 6 months? No  LIVING ENVIRONMENT: Lives with: lives with their family   PLOF: Independent  PATIENT GOALS: improve functional use and pain in LUE  NEXT MD VISIT: 12/24/23  OBJECTIVE:  Note: Objective measures were completed at Evaluation unless otherwise noted.  HAND DOMINANCE: Right  ADLs:Pt is currently using her RUE for ADLs   FUNCTIONAL OUTCOME MEASURES: Quick Dash: 93% disability  UPPER EXTREMITY ROM:   NT due to precautions  Active ROM Right eval Left eval  Shoulder flexion    Shoulder abduction    Shoulder adduction    Shoulder extension    Shoulder internal rotation    Shoulder external rotation    Elbow flexion    Elbow extension    Wrist flexion    Wrist extension    Wrist ulnar deviation    Wrist radial deviation    Wrist pronation    Wrist supination    (Blank rows = not tested)  Active ROM Right eval Left eval  Thumb MCP (0-60)    Thumb IP (0-80)    Thumb Radial abd/add (0-55)     Thumb Palmar abd/add (0-45)     Thumb Opposition to Small Finger     Index MCP (0-90)     Index PIP (0-100)     Index DIP (0-70)      Long MCP (0-90)      Long PIP (0-100)      Long DIP (0-70)      Ring MCP (0-90)      Ring PIP (0-100)      Ring DIP (0-70)      Little MCP (0-90)      Little PIP (0-100)      Little DIP (0-70)      (Blank rows = not tested)    HAND FUNCTION:NT     SENSATION: Not tested  EDEMA: moderate  COGNITION: Overall cognitive status: Within functional limits for tasks assessed   OBSERVATIONS: Pleasant female arrived with hand wrapped in temporary  thumb spica cast . Moderate edema at dorsal hand when cast was removed pt reports cast was too tight and rubbing her.    TREATMENT DATE: 12/15/23  eval, Pt arrived with hand wrapped in temporary  thumb spica cast . Moderate edema at dorsal hand when cast was  removed pt reports cast was too tight and rubbing her. Pt's arm and handwere washed with  soap and water  and pin site was cleaned with saline. Arm was dried thoroughly. 1 suture tail was trimmed back per MD request. Pt's wrist incision is closed and healing without signs of infection. no signs of infection at pin site on dorsal thumb.  Pt was fitted with a custom thumb spica splint with IP joint free and cut out around pin site. Pin was gently covered with strapping material. Pt was educated in hand and pin hygeine with washcloth and soap and water  as well as splint wear, care and precautions. Pt's dtr will need assist her with removing splint 1x daily for hygeine.                                                                                                                                                                                                  PATIENT EDUCATION: Education details: Pt was educated in hand and pin hygeine and splint wear, care and precautions. Person educated: Patient Education method: Explanation Education comprehension: verbalized understanding  HOME EXERCISE PROGRAM: n/a until pin is removed  GOALS: Goals reviewed with patient? Yes  SHORT TERM GOALS: Target date: 01/14/24  I with splint wear, care and precautions.  Goal status: INITIAL  2.  I with inital HEP.  Goal status: INITIAL  3.  I with edema control techniques.  Goal status: INITIAL  4.  Pt will increase thumb MP flexion by 10* from inital measurement for increased functional use Baseline: NT due to prec. Goal status: INITIAL  5.  Pt will increase thumb IP flexion by 10* from inital measurement Baseline: NT due to prec. Goal status: INITIAL    LONG TERM GOALS: Target date: 03/09/24  I with updated HEP.  Goal status: INITIAL  2.  Pt will demonstrate improved LUE functional use as evidenced by improving Quick DASH score to 75% disability or better. Baseline: 93% disbaility Goal  status: INITIAL  3.  Pt will demonstrate grip strength of 25# or greater for increased functional use.  Goal status: INITIAL  4.  Pt will resume use of LUE as a non dominant assist for ADLs with pain no greater than 3/10.  Goal status: INITIAL  5.  Pt will demonstrate ability to oppose 5th digit with thumb for increased functional use of LUE. Baseline: NT Goal status: INITIAL    ASSESSMENT:  CLINICAL IMPRESSION: Patient is a 68 y.o. female who was seen today for occupational therapy evaluation for M18.12 (ICD-10-CM) - Primary osteoarthritis of first carpometacarpal joint of left hand .  Pt underwent the following procedures on 11/27/23: Left thumb carpometacarpal arthroplasty,Left tendon transfer abductor  pollicis longus to flexor carpi radialis,Left partial trapezoid ectomy,Left de Quervain release surgery, Left thumb MCP capsulodesis and pinning. pt presents with the perromance deficits below. she can benefit from skilled occupational therapy to address these deficits in order to maximize pt's safety and I with ADLs/ IADLs. Pt was seen today for splinting only. Pt will not begin A/ROM until pin removal by MD. PERFORMANCE DEFICITS: in functional skills including ADLs, IADLs, coordination, dexterity, sensation, edema, ROM, strength, pain, flexibility, Fine motor control, Gross motor control, decreased knowledge of precautions, wound, skin integrity, and UE functional use, and psychosocial skills including coping strategies, environmental adaptation, habits, interpersonal interactions, and routines and behaviors.   IMPAIRMENTS: are limiting patient from ADLs, IADLs, rest and sleep, education, play, leisure, and social participation.   COMORBIDITIES: may have co-morbidities  that affects occupational performance. Patient will benefit from skilled OT to address above impairments and improve overall function.  MODIFICATION OR ASSISTANCE TO COMPLETE EVALUATION: No modification of tasks or assist  necessary to complete an evaluation.  OT OCCUPATIONAL PROFILE AND HISTORY: Detailed assessment: Review of records and additional review of physical, cognitive, psychosocial history related to current functional performance.  CLINICAL DECISION MAKING: LOW - limited treatment options, no task modification necessary  REHAB POTENTIAL: Good  EVALUATION COMPLEXITY: Low      PLAN:  OT FREQUENCY: 2x/week  OT DURATION: 12 weeks  PLANNED INTERVENTIONS: 97168 OT Re-evaluation, 97535 self care/ADL training, 02889 therapeutic exercise, 97530 therapeutic activity, 97112 neuromuscular re-education, 97140 manual therapy, 97035 ultrasound, 97018 paraffin, 02989 moist heat, 97010 cryotherapy, 97014 electrical stimulation unattended, 97750 Physical Performance Testing, 02239 Orthotic Initial, 97763 Orthotic/Prosthetic subsequent, passive range of motion, energy conservation, coping strategies training, patient/family education, and DME and/or AE instructions  RECOMMENDED OTHER SERVICES: n/a  CONSULTED AND AGREED WITH PLAN OF CARE: Patient  PLAN FOR NEXT SESSION: splint check, and modifications, no ROM until pin removed by MD, will need to further clarify precautions at that time.   Sierra Spargo, OT 12/16/2023, 12:37 PM

## 2023-12-15 ENCOUNTER — Ambulatory Visit: Attending: Orthopedic Surgery | Admitting: Occupational Therapy

## 2023-12-15 DIAGNOSIS — R600 Localized edema: Secondary | ICD-10-CM | POA: Diagnosis not present

## 2023-12-15 DIAGNOSIS — M79642 Pain in left hand: Secondary | ICD-10-CM | POA: Insufficient documentation

## 2023-12-15 DIAGNOSIS — M6281 Muscle weakness (generalized): Secondary | ICD-10-CM | POA: Diagnosis not present

## 2023-12-15 DIAGNOSIS — M1812 Unilateral primary osteoarthritis of first carpometacarpal joint, left hand: Secondary | ICD-10-CM | POA: Insufficient documentation

## 2023-12-15 DIAGNOSIS — M25642 Stiffness of left hand, not elsewhere classified: Secondary | ICD-10-CM | POA: Diagnosis not present

## 2023-12-15 NOTE — Patient Instructions (Signed)
 Your Splint This splint should initially be fitted by a healthcare practitioner.  The healthcare practitioner is responsible for providing wearing instructions and precautions to the patient, other healthcare practitioners and care provider involved in the patient's care.  This splint was custom made for you. Please read the following instructions to learn about wearing and caring for your splint.  Precautions Should your splint cause any of the following problems, remove the splint immediately and contact your therapist/physician. Swelling Severe Pain Pressure Areas Stiffness Numbness  Do not wear your splint while operating machinery unless it has been fabricated for that purpose.  When To Wear Your Splint Where your splint according to your therapist/physician instructions. All the time except for cleaning arm with bath cloth 1x day  Care and Cleaning of Your Splint Keep your splint away from open flames. Your splint will lose its shape in temperatures over 135 degrees Farenheit, ( in car windows, near radiators, ovens or in hot water ).  Never make any adjustments to your splint, if the splint needs adjusting remove it and make an appointment to see your therapist. Your splint, including the cushion liner may be cleaned with soap and lukewarm water .  Do not immerse in hot water  over 135 degrees Farenheit. Straps may be washed with soap and water , but do not moisten the self-adhesive portion.  Call [phone] for [address] location if you need adjustments to splint  [facility] [employer] location [phone]

## 2023-12-16 ENCOUNTER — Encounter: Payer: Self-pay | Admitting: Occupational Therapy

## 2023-12-19 ENCOUNTER — Other Ambulatory Visit: Payer: Self-pay

## 2023-12-19 ENCOUNTER — Emergency Department (HOSPITAL_COMMUNITY)
Admission: EM | Admit: 2023-12-19 | Discharge: 2023-12-19 | Disposition: A | Discharge status: Home / Self Care | Attending: Emergency Medicine | Admitting: Emergency Medicine

## 2023-12-19 ENCOUNTER — Encounter (HOSPITAL_COMMUNITY): Payer: Self-pay

## 2023-12-19 ENCOUNTER — Emergency Department (HOSPITAL_COMMUNITY)

## 2023-12-19 DIAGNOSIS — Z7984 Long term (current) use of oral hypoglycemic drugs: Secondary | ICD-10-CM | POA: Diagnosis not present

## 2023-12-19 DIAGNOSIS — Z471 Aftercare following joint replacement surgery: Secondary | ICD-10-CM | POA: Diagnosis not present

## 2023-12-19 DIAGNOSIS — E119 Type 2 diabetes mellitus without complications: Secondary | ICD-10-CM | POA: Insufficient documentation

## 2023-12-19 DIAGNOSIS — Z4800 Encounter for change or removal of nonsurgical wound dressing: Secondary | ICD-10-CM | POA: Diagnosis not present

## 2023-12-19 DIAGNOSIS — M19041 Primary osteoarthritis, right hand: Secondary | ICD-10-CM | POA: Diagnosis not present

## 2023-12-19 DIAGNOSIS — Z5189 Encounter for other specified aftercare: Secondary | ICD-10-CM

## 2023-12-19 DIAGNOSIS — Z96691 Finger-joint replacement of right hand: Secondary | ICD-10-CM | POA: Diagnosis not present

## 2023-12-19 DIAGNOSIS — M7989 Other specified soft tissue disorders: Secondary | ICD-10-CM | POA: Diagnosis not present

## 2023-12-19 DIAGNOSIS — Z4801 Encounter for change or removal of surgical wound dressing: Secondary | ICD-10-CM | POA: Diagnosis not present

## 2023-12-19 LAB — CBC WITH DIFFERENTIAL/PLATELET
Abs Immature Granulocytes: 0.03 K/uL (ref 0.00–0.07)
Basophils Absolute: 0 K/uL (ref 0.0–0.1)
Basophils Relative: 0 %
Eosinophils Absolute: 0.1 K/uL (ref 0.0–0.5)
Eosinophils Relative: 1 %
HCT: 45.4 % (ref 36.0–46.0)
Hemoglobin: 15.1 g/dL — ABNORMAL HIGH (ref 12.0–15.0)
Immature Granulocytes: 0 %
Lymphocytes Relative: 23 %
Lymphs Abs: 1.7 K/uL (ref 0.7–4.0)
MCH: 31.9 pg (ref 26.0–34.0)
MCHC: 33.3 g/dL (ref 30.0–36.0)
MCV: 95.8 fL (ref 80.0–100.0)
Monocytes Absolute: 0.8 K/uL (ref 0.1–1.0)
Monocytes Relative: 10 %
Neutro Abs: 5 K/uL (ref 1.7–7.7)
Neutrophils Relative %: 66 %
Platelets: 209 K/uL (ref 150–400)
RBC: 4.74 MIL/uL (ref 3.87–5.11)
RDW: 12.6 % (ref 11.5–15.5)
WBC: 7.6 K/uL (ref 4.0–10.5)
nRBC: 0 % (ref 0.0–0.2)

## 2023-12-19 LAB — BASIC METABOLIC PANEL WITH GFR
Anion gap: 15 (ref 5–15)
BUN: 17 mg/dL (ref 8–23)
CO2: 29 mmol/L (ref 22–32)
Calcium: 9.8 mg/dL (ref 8.9–10.3)
Chloride: 96 mmol/L — ABNORMAL LOW (ref 98–111)
Creatinine, Ser: 0.91 mg/dL (ref 0.44–1.00)
GFR, Estimated: >60 mL/min (ref >60)
Glucose, Bld: 107 mg/dL — ABNORMAL HIGH (ref 70–99)
Potassium: 3.6 mmol/L (ref 3.5–5.1)
Sodium: 140 mmol/L (ref 135–145)

## 2023-12-19 LAB — LACTIC ACID, PLASMA: Lactic Acid, Venous: 0.9 mmol/L (ref 0.5–1.9)

## 2023-12-19 MED ORDER — CEPHALEXIN 500 MG PO CAPS
500.0000 mg | ORAL_CAPSULE | Freq: Once | ORAL | Status: AC
  Administered 2023-12-19: 500 mg via ORAL
  Filled 2023-12-19: qty 1

## 2023-12-19 MED ORDER — SODIUM CHLORIDE 0.9% IV SOLUTION: Freq: Once | INTRAVENOUS | Status: DC

## 2023-12-19 MED ORDER — CEPHALEXIN 500 MG PO CAPS
500.0000 mg | ORAL_CAPSULE | Freq: Four times a day (QID) | ORAL | 0 refills | Status: DC
Start: 2023-12-19 — End: 2023-12-30

## 2023-12-19 MED ORDER — HYDROMORPHONE HCL 1 MG/ML IJ SOLN
1.0000 mg | Freq: Once | INTRAMUSCULAR | Status: AC
  Administered 2023-12-19: 1 mg via INTRAMUSCULAR
  Filled 2023-12-19: qty 1

## 2023-12-19 NOTE — Discharge Instructions (Signed)
 As we discussed, take Keflex  as prescribed. Keep your scheduled appointment with Dr. Irving on the 3rd of October.   Return to the ED with any new or concerning symptoms at any time.

## 2023-12-19 NOTE — ED Notes (Signed)
 Pt/family received d/c paperwork at this time. After going over the paperwork any questions, comments, or concerns were answered to the best of this nurse's knowledge. The pt/family verbally acknowledged the teachings/instructions.

## 2023-12-19 NOTE — ED Triage Notes (Signed)
 Pt stated that she had a rod put in her thumb after a joint replacement surgery and now, according to the pt, the rod is becoming infected and is backing itself out of place. Pt stated that she has noticed yellow dc coming from the area as well. Next appt is Oct 2nd

## 2023-12-19 NOTE — ED Provider Notes (Signed)
 [place] EMERGENCY DEPARTMENT AT [facility] Provider Note   CSN: [id number] Arrival date & time: 12/19/23  1440     Patient presents with: Wound Check   [name] is a 68 y.o. female.   Patient to ED with concern for wound infection related to a pin surgically placed in the left thumb ([place]) 8/29. She states last office visit 4 days ago, the cast was removed and she felt in doing so it pulled on the pin causing pain. Since then she has had progressive pain uncontrolled with her prescribed oxycodone , swelling and is now seen a purulent discharge from the wound. She has a h/o T2DM and reports her CBG's have been controlled. No fever.   The history is provided by the patient and a relative. No language interpreter was used.  Wound Check       Prior to Admission medications   Medication Sig Start Date End Date Taking? Authorizing Provider  cephALEXin  (KEFLEX ) 500 MG capsule Take 1 capsule (500 mg total) by mouth 4 (four) times daily. 12/19/23  Yes Odell Balls, PA-C  Accu-Chek Softclix Lancets lancets test blood sugar up to 4 times a day as directed. Dx R73.03 03/02/23   Gladis Mustard, FNP  acyclovir  ointment (ZOVIRAX ) 5 % apply 1 application topically every 3 (three) hours as needed (outbreaks). 12/01/23   Gladis [name], FNP  albuterol  (VENTOLIN  HFA) 108 (90 Base) MCG/ACT inhaler inhale 2 puffs every 6 hours as needed for shortness of breath and wheezing. 12/01/23   Gladis [name], FNP  Alcohol  Swabs  (B-D SINGLE USE SWABS  REGULAR) PADS USE 1 PAD DAILY WHEN CHECKING BLOOD SUGAR. R73.03 06/26/22   Gladis Mustard, FNP  blood glucose meter kit and supplies Dispense based on patient and insurance preference. Use up to four times daily as directed. (FOR ICD-10 E10.9, E11.9). 02/04/21   Gladis [name], FNP  busPIRone  (BUSPAR ) 10 MG tablet Take 1 tablet (10 mg total) by mouth 2 (two) times daily as needed. 12/08/23   Gladis Mustard,  FNP  butalbital -acetaminophen -caffeine  (FIORICET ) 50-325-40 MG tablet take 1 tablet by mouth every 6 (six) hours as needed for headache. 08/27/23   Gladis Mustard, FNP  butalbital -acetaminophen -caffeine  (FIORICET ) 50-325-40 MG tablet Take 1 tablet by mouth every 6 (six) hours as needed for headache. 12/08/23   Gladis, [name], FNP  carvedilol  (COREG ) 3.125 MG tablet TAKE 1/2 (0.5) TABLET TWICE A DAY WITH A MEAL; BLOOD PRESSURE OVER 150 BEFORE TAKING. 12/08/23   Gladis, [name], FNP  celecoxib  (CELEBREX ) 200 MG capsule Take 1 capsule (200 mg total) by mouth daily. 12/08/23 04/06/24  Gladis [name], FNP  Continuous Glucose Sensor (FREESTYLE LIBRE 3 PLUS SENSOR) MISC Apply 1 each topically every 14 (fourteen) days. Change sensor every 15 days. 12/08/23   Gladis [name], FNP  dexlansoprazole  (DEXILANT ) 60 MG capsule Take 1 capsule (60 mg total) by mouth daily. 12/08/23   Gladis, [name], FNP  Dextromethorphan-GG-APAP (CORICIDIN HBP COLD/COUGH/FLU) 10-200-325 MG/15ML LIQD Take 30 mLs by mouth every 4 (four) hours as needed (cough). 02/03/23   Milian, Marry Lenis, FNP  diclofenac  Sodium (VOLTAREN ) 1 % GEL APPLY 4 GRAMS TOPICALLY 4 TIMES A DAY. Patient taking differently: Apply 4 g topically 4 (four) times daily as needed (pain). 03/01/21   Gladis [name], FNP  DULoxetine  (CYMBALTA ) 60 MG capsule Take 1 capsule (60 mg total) by mouth at bedtime. 12/08/23   Gladis [name], FNP  empagliflozin  (JARDIANCE ) 25 MG TABS tablet Take 1 tablet (25 mg total) by mouth daily. 12/08/23  Gladis, [name], FNP  escitalopram  (LEXAPRO ) 20 MG tablet Take 1 tablet (20 mg total) by mouth daily. 12/08/23   Gladis, [name], FNP  estradiol  (ESTRACE ) 0.1 MG/GM vaginal cream Place 1 Applicatorful vaginally 3 (three) times a week. 03/20/23   Gladis, [name], FNP  FIBER PO Take 1 tablet by mouth in the morning and at bedtime.    [provider]  fludrocortisone  (FLORINEF )  0.1 MG tablet take 1 tablet (100mcg total) by mouth every other day. 12/01/23   Gladis, [name], FNP  fluticasone  (FLONASE ) 50 MCG/ACT nasal spray USE 1 SPRAY IN EACH NOSTRIL ONCE DAILY. Patient taking differently: Place 1 spray into both nostrils daily as needed for allergies. 04/04/20   Gladis Mustard, FNP  fluticasone  furoate-vilanterol (BREO ELLIPTA ) 100-25 MCG/ACT AEPB Inhale 1 puff by mouth once daily 04/22/23   Gladis, [name], FNP  furosemide  (LASIX ) 40 MG tablet TAKE 1 TABLET DAILY AS NEEDED FOR EXCESSIVE FLUID. 12/08/23   Gladis, [name], FNP  Glucagon , rDNA, (GLUCAGON  EMERGENCY) 1 MG KIT Inject 1 mg into the skin as needed. 12/08/23   Gladis, [name], FNP  glucose blood (ACCU-CHEK GUIDE) test strip TEST BLOOD SUGAR UP TO 4 TIMES A DAY AS DIRECTED. R73.03 09/24/22   Gladis, [name], FNP  hydrALAZINE  (APRESOLINE ) 25 MG tablet take 1 tablet by mouth 3 times daily as needed (for severe hypertension/systolic number 170 or greater). 09/21/23   Alvan Dorn FALCON, MD  HYDROcodone -acetaminophen  (NORCO) 7.5-325 MG tablet Take 1 tablet by mouth in the morning, at noon, and at bedtime. 02/10/24 03/11/24  Gladis Mustard, FNP  HYDROcodone -acetaminophen  (NORCO) 7.5-325 MG tablet Take 1 tablet by mouth in the morning, at noon, and at bedtime. 01/11/24 02/10/24  Gladis Mustard, FNP  HYDROcodone -acetaminophen  (NORCO) 7.5-325 MG tablet Take 1 tablet by mouth in the morning, at noon, and at bedtime. 12/12/23 01/11/24  Gladis, [name], FNP  ipratropium (ATROVENT ) 0.02 % nebulizer solution USE 1 VIAL ( ) IN NEBULIZER EVERY 6 HOURS AS NEEDED FOR WHEEZING OR SHORTNESS OF BREATH. 06/28/20   Gladis, [name], FNP  isosorbide  mononitrate (IMDUR ) 60 MG 24 hr tablet Take 1 tablet (60 mg total) by mouth 2 (two) times daily. 12/08/23   Gladis [name], FNP  lamoTRIgine  (LAMICTAL ) 150 MG tablet Take 1 tablet (150 mg total) by mouth daily. 12/08/23   Gladis,  [name], FNP  levETIRAcetam  (KEPPRA ) 500 MG tablet Take 1 tablet (500 mg total) by mouth 2 (two) times daily. 12/08/23   Gladis Mustard, FNP  levocetirizine (XYZAL ) 5 MG tablet take 1 tablet by mouth every morning. 06/22/23   Gladis, [name], FNP  lidocaine  (LIDODERM ) 5 % place 1 patch onto the skin daily. remove & discard patch within 12 hours or as directed by md 06/22/23   Gladis Mustard, FNP  lidocaine  (XYLOCAINE ) 5 % ointment apply to affected area 3 times a day as needed for mild or moderate pain. 08/27/23   Gladis, [name], FNP  methocarbamol  (ROBAXIN ) 500 MG tablet take 1 tablet (500 milligram total) by mouth 4 (four) times daily. 12/01/23   Gladis, [name], FNP  nitroGLYCERIN  (NITROSTAT ) 0.4 MG SL tablet place one (1) tablet under tongue every 5 minutes up to (3) doses as needed for chest pain. 10/26/23   Gladis [name], FNP  ondansetron  (ZOFRAN ) 4 MG tablet take 1 tablet by mouth every 8 hours as needed for nausea and vomiting. 12/01/23   Gladis [name], FNP  oxyCODONE  (ROXICODONE ) 5 MG immediate release tablet Take 1 tablet (5 mg total) by mouth every 6 (six) hours as needed.  12/11/23 12/10/24  Agarwala, Gildardo, MD  polyethylene glycol (MIRALAX  / GLYCOLAX ) 17 g packet Take 17 g by mouth 2 (two) times daily. 07/28/23   Bryn Bernardino NOVAK, MD  promethazine -dextromethorphan (PROMETHAZINE -DM) 6.25-15 MG/5ML syrup Take 5 mLs by mouth 3 (three) times daily as needed for cough. 04/03/23   Lavell Bari LABOR, FNP  rosuvastatin  (CRESTOR ) 10 MG tablet Take 1 tablet (10 mg total) by mouth daily. 12/08/23   Gladis [name], FNP  traZODone  (DESYREL ) 150 MG tablet Take 1 tablet (150 mg total) by mouth at bedtime. 06/16/23   Gladis, [name], FNP  valACYclovir  (VALTREX ) 1000 MG tablet take 1 tablet (1,000 MILLIGRAM total) by mouth 3 (three) times daily as needed (with active outbreaks.). 11/05/23   Gladis [name], FNP  valACYclovir  (VALTREX ) 500 MG tablet take (1)  tablet twice daily. 09/21/23   Gladis Mustard, FNP    Allergies: Codeine , Morphine and codeine , Ambien  [zolpidem  tartrate], Clonidine  derivatives, Metformin  and related, Lyrica  [pregabalin ], and Neurontin [gabapentin]    Review of Systems  Updated Vital Signs BP 134/86 (BP Location: Right Arm)   Pulse 92   Temp 99.1 F (37.3 C) (Oral)   Resp 18   Ht 5' 2 (1.575 m)   Wt 59 kg   SpO2 95%   BMI 23.79 kg/m   Physical Exam Constitutional:      General: She is not in acute distress.    Appearance: She is well-developed. She is not ill-appearing.  Pulmonary:     Effort: Pulmonary effort is normal.  Musculoskeletal:        General: Normal range of motion.     Cervical back: Normal range of motion.     Comments: Pin in place to left 1st MCP. No redness or significant swelling. No active drainage.   Skin:    General: Skin is warm and dry.     Findings: No erythema.  Neurological:     Mental Status: She is alert and oriented to person, place, and time.     (all labs ordered are listed, but only abnormal results are displayed) Labs Reviewed  CBC WITH DIFFERENTIAL/PLATELET - Abnormal; Notable for the following components:      Result Value   Hemoglobin 15.1 (*)    All other components within normal limits  BASIC METABOLIC PANEL WITH GFR - Abnormal; Notable for the following components:   Chloride 96 (*)    Glucose, Bld 107 (*)    All other components within normal limits  LACTIC ACID, PLASMA    EKG: None  Radiology: DG Finger Thumb Left Result Date: 12/19/2023 EXAM: 1 VIEW XRAY OF THE RIGHT FINGER(S) 12/19/2023 03:01:58 PM COMPARISON: 12/10/2023 CLINICAL HISTORY: Foreign body. Per Triage: Pt stated that she had a rod put in her thumb after a joint replacement surgery and now, according to the pt, the rod is becoming infected and is backing itself out of place. Pt stated that she has noticed yellow dc coming from the area as well. Next appt is Oct 2nd. FINDINGS: BONES  AND JOINTS: No acute fracture. No bony erosions of osteomyelitis. Degenerative changes noted at the basilar joint. K-wire in place overlying the first MCP joint. Unchanged in position from previous exam. SOFT TISSUES: Soft tissue swelling. IMPRESSION: 1. No acute fracture or bony erosions. 2. K-wire in place overlying the first MCP joint, unchanged in position from previous exam. 3. Soft tissue swelling. 4. Degenerative changes at the basilar joint. Electronically signed by: Waddell Calk MD 12/19/2023 03:18 PM EDT RP Workstation: [id number]  Procedures   Medications Ordered in the ED  HYDROmorphone  (DILAUDID ) injection 1 mg (1 mg Intramuscular Given 12/19/23 1651)  cephALEXin  (KEFLEX ) capsule 500 mg (500 mg Oral Given 12/19/23 1712)    Clinical Course as of 12/19/23 1819  Sat Dec 19, 2023  1814 Patient with concern for infection associated with pin in left thumb. Reports purulent drainage. No fever.   Site appears unremarkable. No erythema. Minimal swelling. Xray shows good position of the pin, unchanged from previous imaging. Labs unremarkable. Discussed Keflex , as-scheduled office follow up with on-call orthopedist, who agrees with plan. Patient comfortable with plan of discharge.  [SU]    Clinical Course User Index [SU] Odell Balls, PA-C                                 Medical Decision Making Amount and/or Complexity of Data Reviewed Labs: ordered. Radiology: ordered.  Risk Prescription drug management.        Final diagnoses:  Visit for wound check    ED Discharge Orders          Ordered    cephALEXin  (KEFLEX ) 500 MG capsule  4 times daily        12/19/23 1818               Odell Balls, PA-C 12/19/23 1819    Towana Ozell BROCKS, MD 12/20/23 1021

## 2023-12-21 ENCOUNTER — Other Ambulatory Visit: Payer: Self-pay | Admitting: Orthopedic Surgery

## 2023-12-21 MED ORDER — OXYCODONE HCL 5 MG PO TABS: 5.0000 mg | ORAL_TABLET | Freq: Four times a day (QID) | ORAL | 0 refills | Status: DC | PRN

## 2023-12-23 ENCOUNTER — Ambulatory Visit: Admitting: Occupational Therapy

## 2023-12-24 ENCOUNTER — Encounter: Admitting: Orthopedic Surgery

## 2023-12-29 ENCOUNTER — Telehealth: Payer: Self-pay

## 2023-12-29 NOTE — Telephone Encounter (Signed)
 LMOM to schedule for 10/1 instead of 10/2 if she would like

## 2023-12-30 ENCOUNTER — Ambulatory Visit: Admitting: Orthopedic Surgery

## 2023-12-30 DIAGNOSIS — M1812 Unilateral primary osteoarthritis of first carpometacarpal joint, left hand: Secondary | ICD-10-CM

## 2023-12-30 MED ORDER — CEPHALEXIN 500 MG PO CAPS: 500.0000 mg | ORAL_CAPSULE | Freq: Four times a day (QID) | ORAL | 0 refills | Status: DC

## 2023-12-30 NOTE — Progress Notes (Signed)
   TALAYIA HJORT - 68 y.o. female MRN [id number]  Date of birth: Jul 01, 1955  Office Visit Note: Visit Date: 12/30/2023 PCP: Gladis Mustard, FNP Referred by: Gladis, [name], *  Subjective:  HPI: BARBETTE MCGLAUN is a 68 y.o. female who presents today for follow up 5 weeks status post Left thumb carpometacarpal arthritis with associated MCP hyperextension .  Having some ongoing pain at the pin site and pain radiating into the basilar thumb and wrist region.  Pertinent ROS were reviewed with the patient and found to be negative unless otherwise specified above in HPI.   Assessment & Plan: Visit Diagnoses:  1. Primary osteoarthritis of first carpometacarpal joint of left hand      Plan: Pin removed today without incident.  Continue with removable splint fabricated by OT.  Continue with range of motion exercises for 4 weeks.  Follow-up at that juncture for recheck and likely progression of strengthening.  Follow-up: No follow-ups on file.   Meds & Orders:  Meds ordered this encounter  Medications   cephALEXin  (KEFLEX ) 500 MG capsule    Sig: Take 1 capsule (500 mg total) by mouth 4 (four) times daily.    Dispense:  20 capsule    Refill:  0    No orders of the defined types were placed in this encounter.    Procedures: No procedures performed       Objective:   Vital Signs: There were no vitals taken for this visit.  Ortho Exam Left wrist: - Well-healed incision at the glabrous/nonglabrous juncture over the [facility] region of the thumb - Pin removed today, pin site remains clean dry and intact - Thumb circumduction without significant pain or crepitus - Hand is warm well-perfused, sensation intact in all distributions including DRSN   Imaging: No results found.    Kaysee Hergert Afton Alderton, M.D. [facility], Hand Surgery

## 2023-12-31 ENCOUNTER — Encounter: Admitting: Orthopedic Surgery

## 2024-01-04 ENCOUNTER — Telehealth: Payer: Self-pay | Admitting: Family Medicine

## 2024-01-04 NOTE — Telephone Encounter (Signed)
 Copied from CRM #[id number]. Topic: Clinical - Medication Question >> Jan 04, 2024 10:29 AM Miquel SAILOR wrote: Reason for CRM: Libre 3 plus Sensors and strips-Patient requesting refill due to only has 10 days left. Needs call back when updated [phone] Provided fax  [facility] [phone]  Location: [facility] [place] [place] - [address] [place] [address] Phone: [phone] Fax: [phone] Hours: Not open 24 hours

## 2024-01-05 ENCOUNTER — Ambulatory Visit: Attending: Orthopedic Surgery | Admitting: Occupational Therapy

## 2024-01-05 ENCOUNTER — Other Ambulatory Visit: Payer: Self-pay | Admitting: Family Medicine

## 2024-01-05 DIAGNOSIS — M6281 Muscle weakness (generalized): Secondary | ICD-10-CM | POA: Insufficient documentation

## 2024-01-05 DIAGNOSIS — R278 Other lack of coordination: Secondary | ICD-10-CM | POA: Diagnosis present

## 2024-01-05 DIAGNOSIS — R600 Localized edema: Secondary | ICD-10-CM | POA: Diagnosis present

## 2024-01-05 DIAGNOSIS — M25642 Stiffness of left hand, not elsewhere classified: Secondary | ICD-10-CM | POA: Insufficient documentation

## 2024-01-05 DIAGNOSIS — M79642 Pain in left hand: Secondary | ICD-10-CM | POA: Diagnosis present

## 2024-01-05 DIAGNOSIS — R7303 Prediabetes: Secondary | ICD-10-CM

## 2024-01-05 DIAGNOSIS — R29898 Other symptoms and signs involving the musculoskeletal system: Secondary | ICD-10-CM | POA: Insufficient documentation

## 2024-01-05 MED ORDER — FREESTYLE LIBRE 3 PLUS SENSOR MISC: 1.0000 | 5 refills | Status: AC

## 2024-01-05 NOTE — Therapy (Signed)
 OUTPATIENT OCCUPATIONAL THERAPY ORTHO TREATMENT  Patient Name: KEYANNA SANDEFER MRN: [id number] DOB:September 21, 1955, 68 y.o., female Today's Date: 01/05/2024  PCP: Gladis Annita Quant FNP REFERRING PROVIDER: Dr. Arlinda  END OF SESSION:  OT End of Session - 01/05/24 0934     Visit Number 2    Number of Visits 25    Date for Recertification  03/09/24    Authorization Type 12 weeks    Progress Note Due on Visit 10    OT Start Time 0932    OT Stop Time 1015    OT Time Calculation (min) 43 min    Equipment Utilized During Treatment Fluidotherapy    Activity Tolerance Patient tolerated treatment well    Behavior During Therapy WFL for tasks assessed/performed          Past Medical History:  Diagnosis Date   Allergy    Anemia    Anginal pain    last time    Anxiety    Arthritis    RHEUMATOID   Asthma    Bipolar 1 disorder (HCC)    Blood transfusion without reported diagnosis    x 3   Cataracts, bilateral 07/2017   CHF (congestive heart failure) (HCC)    COPD (chronic obstructive pulmonary disease) (HCC)    Coronary artery disease    reported hx of MI;  Echo 2009 with normal LVF;  Myoview  05/2011: no ischemia   Depression    Diabetes mellitus without complication (HCC)    Dyslipidemia    Dysrhythmia    SVT   Elevated liver enzymes 06/30/2022   Esophageal stricture    Fibromyalgia    GERD (gastroesophageal reflux disease)    H/O hiatal hernia    Head injury, unspecified    Headache    migraines   Herpes simplex infection    History of kidney stones    History of loop recorder 08/10/2017   Managed by Dr. Cathlyn Birmingham   Hyperlipidemia    Hypertension    Insomnia    Myocardial infarction [facility])    age 20   Osteoporosis    Pneumonia    hx   Seizures (HCC)    none in the last 3-4 years as of 11/03/22 per patient   Shortness of breath    Sleep apnea    mild, does not require positive pressure treatment. (02/03/22)   Spinal stenosis of lumbar region     Spondylolisthesis    Status post placement of implantable loop recorder    Supraventricular tachycardia    Syncope and collapse    s/p ILR; no arhythmogenic cause identified   UTI (lower urinary tract infection)    Past Surgical History:  Procedure Laterality Date   BACK SURGERY     BREAST EXCISIONAL BIOPSY Right    1990s   BREAST SURGERY     lumpectomy   CARDIAC CATHETERIZATION  10/06/2011   CATARACT EXTRACTION W/PHACO Right 07/31/2017   Procedure: CATARACT EXTRACTION PHACO AND INTRAOCULAR LENS PLACEMENT (IOC);  Surgeon: Harrie Agent, MD;  Location: [facility];  Service: Ophthalmology;  Laterality: Right;  CDE: 2.33   CATARACT EXTRACTION W/PHACO Left 08/14/2017   Procedure: CATARACT EXTRACTION PHACO AND INTRAOCULAR LENS PLACEMENT (IOC);  Surgeon: Harrie Agent, MD;  Location: [facility];  Service: Ophthalmology;  Laterality: Left;  CDE: 2.74   CHOLECYSTECTOMY     CYSTOSCOPY     stone   DIAGNOSTIC LAPAROSCOPY     laparoscopic cholecystectomy   DOPPLER ECHOCARDIOGRAPHY  2009   ESOPHAGOGASTRODUODENOSCOPY N/A  10/31/2020   Procedure: ESOPHAGOGASTRODUODENOSCOPY (EGD);  Surgeon: Tanda Locus, MD;  Location: [facility];  Service: General;  Laterality: N/A;   EYE SURGERY Bilateral 08/14/2017   cataract removal   FINGER ARTHROPLASTY Left 11/27/2023   Procedure: ARTHROPLASTY, FINGER;  Surgeon: Arlinda Buster, MD;  Location: [facility];  Service: Orthopedics;  Laterality: Left;  LEFT THUMB CARPOMATACARPAL JOINT ARTHROPLASTY WITH INTERNAL BRACE AND METACARPOPHALANGEAL JOINT CAPSULODESIS PINNING   GLUTEUS MINIMUS REPAIR Left 11/04/2022   Procedure: LEFT GLUTEUS MEDIUS REPAIR;  Surgeon: Genelle Standing, MD;  Location: [facility];  Service: Orthopedics;  Laterality: Left;   head up tilt table testing  06/15/2007   Danelle Birmingham   HEMORRHOID SURGERY     HERNIA REPAIR     insertion of implatable loop recorder  08/11/2007   Danelle Birmingham   POSTERIOR CERVICAL FUSION/FORAMINOTOMY N/A 12/19/2013    Procedure: RIGHT C3-4.C4-5 AND C5-6 FORAMINOTOMIES;  Surgeon: Lynwood FORBES Better, MD;  Location: [facility] OR;  Service: Orthopedics;  Laterality: N/A;   TOTAL HIP ARTHROPLASTY Left 01/31/2020   Procedure: LEFT TOTAL HIP ARTHROPLASTY ANTERIOR APPROACH;  Surgeon: Vernetta Lonni GRADE, MD;  Location: [facility];  Service: Orthopedics;  Laterality: Left;   TOTAL SHOULDER ARTHROPLASTY Right 11/15/2019   Procedure: RIGHT TOTAL SHOULDER ARTHROPLASTY;  Surgeon: Addie Cordella Hamilton, MD;  Location: [facility];  Service: Orthopedics;  Laterality: Right;   TOTAL SHOULDER REVISION Right 06/11/2021   Procedure: RIGHT SHOULDER CONVERSION TOTAL SHOULDER ARTHROPLASTY to REVERSE TOTAL SHOULDER ARTHROPLASTY;  Surgeon: Addie Cordella Hamilton, MD;  Location: [facility] OR;  Service: Orthopedics;  Laterality: Right;   TUBAL LIGATION     UPPER GASTROINTESTINAL ENDOSCOPY     XI ROBOTIC ASSISTED HIATAL HERNIA REPAIR N/A 10/31/2020   Procedure: XI ROBOTIC ASSISTED HIATAL HERNIA REPAIR WITH PARTIAL FUNDOPLICATION;  Surgeon: Tanda Locus, MD;  Location: [facility];  Service: General;  Laterality: N/A;   Patient Active Problem List   Diagnosis Date Noted   Arthritis of carpometacarpal [facility]) joint of left thumb 11/27/2023   Gonorrhea 12/03/2022   Acute cystitis with hematuria 11/26/2022   Tear of left gluteus minimus tendon 11/04/2022   Instability of prosthetic shoulder joint    S/P reverse total shoulder arthroplasty, right 06/11/2021   Hypokalemia 11/25/2020   S/P laparoscopic fundoplication 10/31/2020   Status post total replacement of left hip 01/31/2020   Unilateral primary osteoarthritis, left hip 12/21/2019   S/P shoulder replacement, right 11/15/2019   Diverticulosis 04/14/2019   Chronic diastolic CHF (congestive heart failure) (HCC) 04/14/2019   Unstable angina (HCC) 10/20/2017   Chronic pain 09/23/2016   Pre-diabetes 09/02/2016   Recurrent major depressive disorder, in partial remission 07/14/2016   Seizures (HCC) 07/14/2016   GAD  (generalized anxiety disorder) 07/14/2016   Insomnia 09/05/2014   Allergic rhinitis 09/05/2014   Cervical spondylosis without myelopathy 12/19/2013    Class: Chronic   Neural foraminal stenosis of cervical spine 12/19/2013   Cervical radiculitis 09/19/2013   Lumbosacral spondylosis without myelopathy 11/16/2012   Postlaminectomy syndrome, lumbar region 11/16/2012   Herpes simplex virus (HSV) infection 10/31/2008   Hyperlipidemia with target LDL less than 100 10/31/2008   Primary hypertension 10/31/2008   GERD 10/31/2008   SPINAL STENOSIS OF LUMBAR REGION 10/31/2008   Osteoporosis 10/31/2008   SPONDYLOLISTHESIS 10/31/2008   SYNCOPE 10/31/2008   Sleep apnea 10/31/2008    ONSET DATE: 11/27/23  REFERRING DIAG: M18.12 (ICD-10-CM) - Primary osteoarthritis of first carpometacarpal joint of left hand    THERAPY DIAG:  Pain in left hand  Stiffness of left hand, not elsewhere classified  Muscle weakness (generalized)  Localized edema  Other symptoms and signs involving the musculoskeletal system  Rationale for Evaluation and Treatment: Rehabilitation  SUBJECTIVE:   SUBJECTIVE STATEMENT:  Pt reports her pain was initially at 4/10 but that the swelling has just starting to go down.  Pt did not have any particular exercises she has been doing as she was awaiting pin removal by MD and this is her first OT visit since that appt.  Pt went to MD on 12/30/23 Plan: Pin removed today without incident.  Continue with removable splint fabricated by OT.  Continue with range of motion exercises for 4 weeks.  Follow-up at that juncture for recheck and likely progression of strengthening.   Pt accompanied by: self  PERTINENT HISTORY:  Pt with L thumb carpometacarpal arthritis with associated MCP hyperextension. pt underwent the following procedures on 11/27/23:  Left thumb carpometacarpal arthroplasty Left tendon transfer abductor pollicis longus to flexor carpi radialis Left partial trapezoid  ectomy Left de Quervain release surgery Left thumb MCP capsulodesis and pinning PMHx: Reverse tSA,anxiety, arthritis , bipolar CHF, COPD, CAD depression, DM fribromyalgia, head injury, MI , seizure ,back surg, L THA    PRECAUTIONS: Other: splint at all times, wait to begin ROM after pin removed , Will need to further clarify ROM prec with Dr. Erwin hx of seizures  RED FLAGS: None   WEIGHT BEARING RESTRICTIONS: Yes NWB to LUE  PAIN:  Are you having pain? Yes: NPRS scale: 4-6/10 Pain location: L hand Pain description: aching, swollen Aggravating factors: tight cast/ splint Relieving factors: removing cast  FALLS: Has patient fallen in last 6 months? No  LIVING ENVIRONMENT: Lives with: lives with their family   PLOF: Independent  PATIENT GOALS: improve functional use and pain in LUE  NEXT MD VISIT: 02/01/24  OBJECTIVE:  Note: Objective measures were completed at Evaluation unless otherwise noted.  HAND DOMINANCE: Right  ADLs:Pt is currently using her RUE for ADLs   FUNCTIONAL OUTCOME MEASURES: Quick Dash: 93% disability  UPPER EXTREMITY ROM:   NT due to precautions  Active ROM Right eval Left eval  Shoulder flexion    Shoulder abduction    Shoulder adduction    Shoulder extension    Shoulder internal rotation    Shoulder external rotation    Elbow flexion    Elbow extension    Wrist flexion    Wrist extension    Wrist ulnar deviation    Wrist radial deviation    Wrist pronation    Wrist supination    (Blank rows = not tested)  Active ROM Right eval Left eval  Thumb MCP (0-60)    Thumb IP (0-80)    Thumb Radial abd/add (0-55)     Thumb Palmar abd/add (0-45)     Thumb Opposition to Small Finger     Index MCP (0-90)     Index PIP (0-100)     Index DIP (0-70)      Long MCP (0-90)      Long PIP (0-100)      Long DIP (0-70)      Ring MCP (0-90)      Ring PIP (0-100)      Ring DIP (0-70)      Little MCP (0-90)      Little PIP (0-100)      Little  DIP (0-70)      (Blank rows = not tested)  HAND FUNCTION:NT  COORDINATION: NT  SENSATION: Not tested  EDEMA: moderate  COGNITION: Overall cognitive status: Within functional limits for tasks assessed   OBSERVATIONS: Pleasant female arrived with hand wrapped in temporary  thumb spica cast . Moderate edema at dorsal hand when cast was removed pt reports cast was too tight and rubbing her.    TREATMENT DATE: 01/05/24      - Therapeutic exercises completed for duration as noted below including:  Pt placed LUE in Fluidotherapy machine with supervised ROM x 10 min. Pt was educated to complete gentle AROM (tendon glides) during modality time to improve ROM and decrease pain/stiffness of affected extremity by use of the machine's massaging action and thermal properties.  Pt initially reported pain went down to 2-3/10 but with movement, pain went up to 6/10.  Pt issued tendon gliding exercises/handout with review of motions to isolate DIP, PIP and MCP joints for straight finger position, hook (DIP/PIP flexion), fist (DIP/PIP/MCP flexion), taco/duck (MCP flexion only) and flat fist (MCP and PIP flexion). Education provided on purpose of tendon glide exercises ie) to increase the circulation to the hand and wrist, reduce swelling and promote healthier soft tissue for increased AROM and not to build hand or wrist strength at this time.  In addition, pt encouraged to work on wrist flexion/extension out of splint and finger opposition.  - Manual therapy completed for duration as noted below including:  Therapist provided manual techniques and educated pt in scar massage at healed site of incision for reduction of scar tissue, to promote improved AROM and pain reduction of affected surgical site. Improved appearance to incision noted upon completion with less palpable adhesions and therefore improved ROM.  Pt provided instruction re: massaging scar in three directions: circles, side to side, and up  and down with return demonstration sought and handout provided per pt instruction.  - Self Care education and training completed for duration as noted below including: Initiated education on joint protection strategies, encouraging pt to monitor activity levels and take rest breaks as needed in response to discomfort or fatigue. During session, pt observed to hold L hand in a protective posture with thumb abducted and hand maintained in a stiff, isometric position. Provided education and guided practice in relaxation techniques to reduce unnecessary muscle tension and prevent fatigue and joint stiffness.  PATIENT EDUCATION: Education details: Pt was initial HEP ie) tendon glides for fingers and finger opposition.  Person educated: Patient Education method: Explanation, Demonstration, Verbal cues, and Handouts Education comprehension: verbalized understanding, returned demonstration, verbal cues required, tactile cues required, and needs further education  HOME EXERCISE PROGRAM: 01/05/24: Tendon glide, thumb opposition, wrist flex/ext and scar massage  GOALS: Goals reviewed with patient? Yes  SHORT TERM GOALS: Target date: 01/14/24  I with splint wear, care and precautions.  Goal status: MET  2.  I with inital HEP.  Goal status: IN Progress  3.  I with edema control techniques.  Goal status: IN Progress  4.  Pt will increase thumb MP flexion by 10* from inital measurement for increased functional use Baseline: NT due to prec. Goal status: IN Progress  5.  Pt will increase thumb IP flexion by 10* from inital measurement Baseline: NT due to prec. Goal status: IN Progress    LONG TERM GOALS: Target date: 03/09/24  I with updated HEP.  Goal status: INITIAL  2.  Pt will demonstrate improved LUE functional use as evidenced by improving Quick DASH score to 75% disability or better. Baseline: 93% disbaility Goal status: INITIAL  3.  Pt will demonstrate grip strength of 25# or  greater for increased functional use.  Goal status: INITIAL  4.  Pt will resume use of LUE as a non dominant assist for ADLs with pain no greater than 3/10.  Goal status: INITIAL  5.  Pt will demonstrate ability to oppose 5th digit with thumb for increased functional use of LUE. Baseline: NT Goal status: INITIAL    ASSESSMENT:  CLINICAL IMPRESSION: Patient is a 68 y.o. female who was seen today for occupational therapy s/p multiple procedures for OA L thumb ie) Left thumb carpometacarpal arthroplasty, Left tendon transfer abductor pollicis longus to flexor carpi radialis, Left partial trapezoidectomy, Left de Quervain release surgery, Left thumb MCP capsulodesis and pinning.   Pt presents with good response to fluidotherapy initially but was bothered by prolonged gentle AROM.  Education provided on short duration of HEP up to 5x/day . Pt will benefit from skilled occupational therapy to address deficits in strength, ROM, coordination and pain in order to maximize pt's safety and I with ADLs/ IADLs.   PERFORMANCE DEFICITS: in functional skills including ADLs, IADLs, coordination, dexterity, sensation, edema, ROM, strength, pain, flexibility, Fine motor control, Gross motor control, decreased knowledge of precautions, wound, skin integrity, and UE functional use, and psychosocial skills including coping strategies, environmental adaptation, habits, interpersonal interactions, and routines and behaviors.   IMPAIRMENTS: are limiting patient from ADLs, IADLs, rest and sleep, education, play, leisure, and social participation.   COMORBIDITIES: may have co-morbidities  that affects occupational performance. Patient will benefit from skilled OT to address above impairments and improve overall function.  REHAB POTENTIAL: Good  PLAN:  OT FREQUENCY: 2x/week  OT DURATION: 12 weeks  PLANNED INTERVENTIONS: 97168 OT Re-evaluation, 97535 self care/ADL training, 02889 therapeutic exercise, 97530  therapeutic activity, 97112 neuromuscular re-education, 97140 manual therapy, 97035 ultrasound, 97018 paraffin, 02989 moist heat, 97010 cryotherapy, 97014 electrical stimulation unattended, 97750 Physical Performance Testing, 02239 Orthotic Initial, 97763 Orthotic/Prosthetic subsequent, passive range of motion, energy conservation, coping strategies training, patient/family education, and DME and/or AE instructions  RECOMMENDED OTHER SERVICES: n/a  CONSULTED AND AGREED WITH PLAN OF CARE: Patient  PLAN FOR NEXT SESSION:  splint check and modifications,  Progress HEP and strength per protocols Modalities as needed   Clarita LITTIE Pride, OT 01/05/2024, 7:13 PM

## 2024-01-05 NOTE — Telephone Encounter (Signed)
 Refill sent.

## 2024-01-05 NOTE — Patient Instructions (Addendum)
 SABRA

## 2024-01-07 ENCOUNTER — Other Ambulatory Visit: Payer: Self-pay | Admitting: Nurse Practitioner

## 2024-01-07 DIAGNOSIS — B009 Herpesviral infection, unspecified: Secondary | ICD-10-CM

## 2024-01-07 DIAGNOSIS — F5101 Primary insomnia: Secondary | ICD-10-CM

## 2024-01-08 ENCOUNTER — Other Ambulatory Visit: Payer: Self-pay | Admitting: Nurse Practitioner

## 2024-01-08 ENCOUNTER — Other Ambulatory Visit (HOSPITAL_COMMUNITY): Payer: Self-pay

## 2024-01-08 ENCOUNTER — Telehealth: Payer: Self-pay

## 2024-01-08 NOTE — Telephone Encounter (Signed)
 Pharmacy Patient Advocate Encounter   Received notification from Onbase that prior authorization for FreeStyle Libre 3 Plus Sensor  is required/requested.   Insurance verification completed.   The patient is insured through [facility].   Per test claim: PA required; PA submitted to above mentioned insurance via Latent Key/confirmation #/EOC [id number] Status is pending

## 2024-01-11 ENCOUNTER — Telehealth: Payer: Self-pay | Admitting: Pharmacist

## 2024-01-11 NOTE — Telephone Encounter (Signed)
 It looks like patient has been getting this for quite some time. Last time it was send through [facility]. Is that something that you take care of?

## 2024-01-11 NOTE — Telephone Encounter (Signed)
 She may need specialist get this approved for her

## 2024-01-11 NOTE — Telephone Encounter (Signed)
 Separate encounter created and routed to RN CGM orders and request updated in parachute portal Appreciate assistance

## 2024-01-11 NOTE — Telephone Encounter (Signed)
 Hi! Thank you for responding. It is fine to wait on Mliss to return. Thank you so much for your help

## 2024-01-11 NOTE — Telephone Encounter (Signed)
 New orders submitted for libre 3 plus CGM and proof of hypoglycemia (advanced diabetes supply via parach[employer] portal) This should allow for patient to call and received refills [phone] Thank you! Alycia Cooperwood

## 2024-01-11 NOTE — Telephone Encounter (Signed)
 Last OV 12/08/23. Last RF 12/01/23 #30 no RF. Next OV 03/15/24

## 2024-01-11 NOTE — Telephone Encounter (Signed)
 Pharmacy Patient Advocate Encounter  Received notification from [employer] that Prior Authorization for  FreeStyle Libre 3 Plus Sensor  has been DENIED.  Full denial letter will be uploaded to the media tab. See denial reason below.   PA #/Case ID/Reference #: [id number]

## 2024-01-12 ENCOUNTER — Ambulatory Visit: Admitting: Occupational Therapy

## 2024-01-12 DIAGNOSIS — M79642 Pain in left hand: Secondary | ICD-10-CM | POA: Diagnosis not present

## 2024-01-12 DIAGNOSIS — M25642 Stiffness of left hand, not elsewhere classified: Secondary | ICD-10-CM

## 2024-01-12 DIAGNOSIS — R600 Localized edema: Secondary | ICD-10-CM

## 2024-01-12 DIAGNOSIS — M6281 Muscle weakness (generalized): Secondary | ICD-10-CM

## 2024-01-12 DIAGNOSIS — R278 Other lack of coordination: Secondary | ICD-10-CM

## 2024-01-12 NOTE — Telephone Encounter (Signed)
 Called and spoke with patient and informed her. Also sent info to Mychart per patients request. Patient verbalized understanding

## 2024-01-12 NOTE — Therapy (Signed)
 OUTPATIENT OCCUPATIONAL THERAPY ORTHO TREATMENT  Patient Name: [name] MRN: [id number] DOB:10/08/1955, 68 y.o., female Today's Date: 01/12/2024  PCP: Gladis Annita Quant FNP REFERRING PROVIDER: Dr. Arlinda  END OF SESSION:  OT End of Session - 01/12/24 0755     Visit Number 3    Number of Visits 25    Date for Recertification  03/09/24    Authorization Type 12 weeks    Progress Note Due on Visit 10    OT Start Time 0801    OT Stop Time 0901    OT Time Calculation (min) 60 min    Equipment Utilized During Treatment Fluidotherapy, Orficast    Activity Tolerance Patient tolerated treatment well    Behavior During Therapy WFL for tasks assessed/performed          Past Medical History:  Diagnosis Date   Allergy    Anemia    Anginal pain    last time    Anxiety    Arthritis    RHEUMATOID   Asthma    Bipolar 1 disorder (HCC)    Blood transfusion without reported diagnosis    x 3   Cataracts, bilateral 07/2017   CHF (congestive heart failure) (HCC)    COPD (chronic obstructive pulmonary disease) (HCC)    Coronary artery disease    reported hx of MI;  Echo 2009 with normal LVF;  Myoview  05/2011: no ischemia   Depression    Diabetes mellitus without complication (HCC)    Dyslipidemia    Dysrhythmia    SVT   Elevated liver enzymes 06/30/2022   Esophageal stricture    Fibromyalgia    GERD (gastroesophageal reflux disease)    H/O hiatal hernia    Head injury, unspecified    Headache    migraines   Herpes simplex infection    History of kidney stones    History of loop recorder 08/10/2017   Managed by Dr. Cathlyn Birmingham   Hyperlipidemia    Hypertension    Insomnia    Myocardial infarction [facility])    age 68   Osteoporosis    Pneumonia    hx   Seizures (HCC)    none in the last 3-4 years as of 11/03/22 per patient   Shortness of breath    Sleep apnea    mild, does not require positive pressure treatment. (02/03/22)   Spinal stenosis of lumbar region     Spondylolisthesis    Status post placement of implantable loop recorder    Supraventricular tachycardia    Syncope and collapse    s/p ILR; no arhythmogenic cause identified   UTI (lower urinary tract infection)    Past Surgical History:  Procedure Laterality Date   BACK SURGERY     BREAST EXCISIONAL BIOPSY Right    1990s   BREAST SURGERY     lumpectomy   CARDIAC CATHETERIZATION  10/06/2011   CATARACT EXTRACTION W/PHACO Right 07/31/2017   Procedure: CATARACT EXTRACTION PHACO AND INTRAOCULAR LENS PLACEMENT (IOC);  Surgeon: Harrie Agent, MD;  Location: [facility];  Service: Ophthalmology;  Laterality: Right;  CDE: 2.33   CATARACT EXTRACTION W/PHACO Left 08/14/2017   Procedure: CATARACT EXTRACTION PHACO AND INTRAOCULAR LENS PLACEMENT (IOC);  Surgeon: Harrie Agent, MD;  Location: [facility];  Service: Ophthalmology;  Laterality: Left;  CDE: 2.74   CHOLECYSTECTOMY     CYSTOSCOPY     stone   DIAGNOSTIC LAPAROSCOPY     laparoscopic cholecystectomy   DOPPLER ECHOCARDIOGRAPHY  2009   ESOPHAGOGASTRODUODENOSCOPY  N/A 10/31/2020   Procedure: ESOPHAGOGASTRODUODENOSCOPY (EGD);  Surgeon: Tanda Locus, MD;  Location: [facility];  Service: General;  Laterality: N/A;   EYE SURGERY Bilateral 08/14/2017   cataract removal   FINGER ARTHROPLASTY Left 11/27/2023   Procedure: ARTHROPLASTY, FINGER;  Surgeon: Arlinda Buster, MD;  Location: [facility];  Service: Orthopedics;  Laterality: Left;  LEFT THUMB CARPOMATACARPAL JOINT ARTHROPLASTY WITH INTERNAL BRACE AND METACARPOPHALANGEAL JOINT CAPSULODESIS PINNING   GLUTEUS MINIMUS REPAIR Left 11/04/2022   Procedure: LEFT GLUTEUS MEDIUS REPAIR;  Surgeon: Genelle Standing, MD;  Location: [facility];  Service: Orthopedics;  Laterality: Left;   head up tilt table testing  06/15/2007   Danelle Birmingham   HEMORRHOID SURGERY     HERNIA REPAIR     insertion of implatable loop recorder  08/11/2007   Danelle Birmingham   POSTERIOR CERVICAL FUSION/FORAMINOTOMY N/A 12/19/2013    Procedure: RIGHT C3-4.C4-5 AND C5-6 FORAMINOTOMIES;  Surgeon: Lynwood FORBES Better, MD;  Location: [facility] OR;  Service: Orthopedics;  Laterality: N/A;   TOTAL HIP ARTHROPLASTY Left 01/31/2020   Procedure: LEFT TOTAL HIP ARTHROPLASTY ANTERIOR APPROACH;  Surgeon: Vernetta Lonni GRADE, MD;  Location: [facility];  Service: Orthopedics;  Laterality: Left;   TOTAL SHOULDER ARTHROPLASTY Right 11/15/2019   Procedure: RIGHT TOTAL SHOULDER ARTHROPLASTY;  Surgeon: Addie Cordella Hamilton, MD;  Location: [facility];  Service: Orthopedics;  Laterality: Right;   TOTAL SHOULDER REVISION Right 06/11/2021   Procedure: RIGHT SHOULDER CONVERSION TOTAL SHOULDER ARTHROPLASTY to REVERSE TOTAL SHOULDER ARTHROPLASTY;  Surgeon: Addie Cordella Hamilton, MD;  Location: [facility] OR;  Service: Orthopedics;  Laterality: Right;   TUBAL LIGATION     UPPER GASTROINTESTINAL ENDOSCOPY     XI ROBOTIC ASSISTED HIATAL HERNIA REPAIR N/A 10/31/2020   Procedure: XI ROBOTIC ASSISTED HIATAL HERNIA REPAIR WITH PARTIAL FUNDOPLICATION;  Surgeon: Tanda Locus, MD;  Location: [facility];  Service: General;  Laterality: N/A;   Patient Active Problem List   Diagnosis Date Noted   Arthritis of carpometacarpal [facility]) joint of left thumb 11/27/2023   Gonorrhea 12/03/2022   Acute cystitis with hematuria 11/26/2022   Tear of left gluteus minimus tendon 11/04/2022   Instability of prosthetic shoulder joint    S/P reverse total shoulder arthroplasty, right 06/11/2021   Hypokalemia 11/25/2020   S/P laparoscopic fundoplication 10/31/2020   Status post total replacement of left hip 01/31/2020   Unilateral primary osteoarthritis, left hip 12/21/2019   S/P shoulder replacement, right 11/15/2019   Diverticulosis 04/14/2019   Chronic diastolic CHF (congestive heart failure) (HCC) 04/14/2019   Unstable angina (HCC) 10/20/2017   Chronic pain 09/23/2016   Pre-diabetes 09/02/2016   Recurrent major depressive disorder, in partial remission 07/14/2016   Seizures (HCC) 07/14/2016   GAD  (generalized anxiety disorder) 07/14/2016   Insomnia 09/05/2014   Allergic rhinitis 09/05/2014   Cervical spondylosis without myelopathy 12/19/2013    Class: Chronic   Neural foraminal stenosis of cervical spine 12/19/2013   Cervical radiculitis 09/19/2013   Lumbosacral spondylosis without myelopathy 11/16/2012   Postlaminectomy syndrome, lumbar region 11/16/2012   Herpes simplex virus (HSV) infection 10/31/2008   Hyperlipidemia with target LDL less than 100 10/31/2008   Primary hypertension 10/31/2008   GERD 10/31/2008   SPINAL STENOSIS OF LUMBAR REGION 10/31/2008   Osteoporosis 10/31/2008   SPONDYLOLISTHESIS 10/31/2008   SYNCOPE 10/31/2008   Sleep apnea 10/31/2008    ONSET DATE: 11/27/23  REFERRING DIAG: M18.12 (ICD-10-CM) - Primary osteoarthritis of first carpometacarpal joint of left hand    THERAPY DIAG:  Pain in left hand  Stiffness of left hand, not elsewhere classified  Muscle weakness (generalized)  Localized edema  Other lack of coordination  Rationale for Evaluation and Treatment: Rehabilitation  SUBJECTIVE:   SUBJECTIVE STATEMENT:  Pt arrived without her splint and reports only wearing it at night or if her hand hurts in the daytime.  She rated per pain at 5/10 and at worst 7/10 this past week. Today, she reports that it is is harder to touch her thumb to her pinkie finger compared to last week.   Pt reports she thinks she has been overdoing it.  She admits to using her hand to help get out of the tub and for chores including washing dishes etc without a splint on.  In addition, pt reports 2 falls since surgery including a fall this weekend ie) she was moving a laundry basket and fell - didn't have her splint on.  Pt accompanied by: self  PERTINENT HISTORY:  Pt with L thumb carpometacarpal arthritis with associated MCP hyperextension. pt underwent the following procedures on 11/27/23:  Left thumb carpometacarpal arthroplasty Left tendon transfer abductor  pollicis longus to flexor carpi radialis Left partial trapezoid ectomy Left de Quervain release surgery Left thumb MCP capsulodesis and pinning  PMHx: Reverse tSA,anxiety, arthritis , bipolar CHF, COPD, CAD depression, DM fribromyalgia, head injury, MI , seizure ,back surg, L THA   Pt went to MD on 12/30/23 Plan: Pin removed today without incident.  Continue with removable splint fabricated by OT.  Continue with range of motion exercises for 4 weeks.  Follow-up at that juncture for recheck and likely progression of strengthening.   PRECAUTIONS: Other: splint at all times, wait to begin ROM after pin removed , hx of seizures  RED FLAGS: None   WEIGHT BEARING RESTRICTIONS: Yes NWB to LUE  PAIN:  Are you having pain? Yes: NPRS scale: 5/10 Pain location: L hand Pain description: stiff and muscles are sore Aggravating factors: using it Relieving factors: resting, does take meds for back but not really her hand  FALLS: Has patient fallen in last 6 months? Yes. Number of falls 2 since surgery; fell this weekend - moving a laundry basket and fell - didn't have her splint on.  LIVING ENVIRONMENT: Lives with: lives with their family   PLOF: Independent  PATIENT GOALS: improve functional use and pain in LUE  NEXT MD VISIT: 02/01/24  OBJECTIVE:  Note: Objective measures were completed at Evaluation unless otherwise noted.  HAND DOMINANCE: Right  ADLs:Pt is currently using her RUE for ADLs   FUNCTIONAL OUTCOME MEASURES: Quick Dash: 93% disability  UPPER EXTREMITY ROM:   NT due to precautions  Active ROM Right eval Left eval  Shoulder flexion    Shoulder abduction    Shoulder adduction    Shoulder extension    Shoulder internal rotation    Shoulder external rotation    Elbow flexion    Elbow extension    Wrist flexion    Wrist extension    Wrist ulnar deviation    Wrist radial deviation    Wrist pronation    Wrist supination    (Blank rows = not tested)  Active ROM  Right eval Left eval  Thumb MCP (0-60)    Thumb IP (0-80)    Thumb Radial abd/add (0-55)     Thumb Palmar abd/add (0-45)     Thumb Opposition to Small Finger     Index MCP (0-90)     Index PIP (0-100)     Index DIP (  0-70)      Long MCP (0-90)      Long PIP (0-100)      Long DIP (0-70)      Ring MCP (0-90)      Ring PIP (0-100)      Ring DIP (0-70)      Little MCP (0-90)      Little PIP (0-100)      Little DIP (0-70)      (Blank rows = not tested)  HAND FUNCTION:NT  COORDINATION: NT  SENSATION: Not tested  EDEMA: moderate  COGNITION: Overall cognitive status: Within functional limits for tasks assessed   OBSERVATIONS: Pleasant female arrived with hand wrapped in temporary  thumb spica cast . Moderate edema at dorsal hand when cast was removed pt reports cast was too tight and rubbing her.    TREATMENT DATE: 01/12/24     - Orthotic fabrication: Pt was fitted with a L custom fabricated hand based, thumb spica splint from Orficast placing her left thumb in slight ABD and rotation, or the functional position following a thumb CMC arthroplasty on 11/27/23. Pt was educated to wear the splint at all times for protection, she was educated in splinting use, care and precautions.   - Therapeutic exercises completed for duration as noted below including:  Pt placed LUE in Fluidotherapy machine with supervised ROM x 10 min. Pt was educated to complete gentle AROM tendon glides, finger opposition x 3 fingers, wrist flexion/extension etc during modality time to improve ROM and decrease pain/stiffness of affected extremity by use of the machine's massaging action and thermal properties.  Pt initially reported pain went down to 2-3/10.  Reviewed IP flexion with MCP stabilization and wrist flexion/extension with hand based splint applied.  Pt encouraged to wear hand based splint more to prevent overuse and keep pain <5/10 as well as to prevent injury.  - Self Care education and training  completed for duration as noted below including: Reiterated education on joint protection strategies, encouraging pt to use splint appropriately, take rest breaks as needed in response to discomfort or fatigue.  Emphasized avoiding movement patterns such as lateral pinch, adduction of the thumb against the index finger and wide radial abduction to avoid overstretching of the LTRI and accentuate the deforming forces per [place]  protocol.   PATIENT EDUCATION: Education details: Continued HEP, gradual splint reduction.  Person educated: Patient Education method: Explanation, Demonstration, and Verbal cues Education comprehension: verbalized understanding, returned demonstration, verbal cues required, tactile cues required, and needs further education  HOME EXERCISE PROGRAM: 01/05/24: Tendon glide, thumb opposition, wrist flex/ext and scar massage  GOALS: Goals reviewed with patient? Yes  SHORT TERM GOALS: Target date: 01/14/24  I with splint wear, care and precautions.  Goal status: MET  2.  I with inital HEP.  Goal status: IN Progress  3.  I with edema control techniques.  Goal status: IN Progress  4.  Pt will increase thumb MP flexion by 10* from inital measurement for increased functional use Baseline: NT due to prec. Goal status: IN Progress  5.  Pt will increase thumb IP flexion by 10* from inital measurement Baseline: NT due to prec. Goal status: IN Progress    LONG TERM GOALS: Target date: 03/09/24  I with updated HEP.  Goal status: IN Progress  2.  Pt will demonstrate improved LUE functional use as evidenced by improving Quick DASH score to 75% disability or better. Baseline: 93% disbaility Goal status: INITIAL  3.  Pt will demonstrate grip  strength of 25# or greater for increased functional use.  Goal status: INITIAL  4.  Pt will resume use of LUE as a non dominant assist for ADLs with pain no greater than 3/10.  Goal status: IN Progress  5.  Pt will  demonstrate ability to oppose 5th digit with thumb for increased functional use of LUE. Baseline: NT Goal status: IN Progress    ASSESSMENT:  CLINICAL IMPRESSION: Patient is a 68 y.o. female who was seen today for occupational therapy s/p multiple procedures for OA L thumb ie) Left thumb carpometacarpal arthroplasty, Left tendon transfer abductor pollicis longus to flexor carpi radialis, Left partial trapezoidectomy, Left de Quervain release surgery, Left thumb MCP capsulodesis and pinning.   Pt provided hand based thumb protection splint for increased daytime use with education to work for pain <5/10 with pt reporting good response to fluidotherapy and gentle AROM.  Education provided on joint protection and gradual increased in daily activities as splint weaning in the daytime begins . Pt will benefit from skilled occupational therapy to address deficits in strength, ROM, coordination and pain in order to maximize pt's safety and I with ADLs/ IADLs.   PERFORMANCE DEFICITS: in functional skills including ADLs, IADLs, coordination, dexterity, sensation, edema, ROM, strength, pain, flexibility, Fine motor control, Gross motor control, decreased knowledge of precautions, wound, skin integrity, and UE functional use, and psychosocial skills including coping strategies, environmental adaptation, habits, interpersonal interactions, and routines and behaviors.   IMPAIRMENTS: are limiting patient from ADLs, IADLs, rest and sleep, education, play, leisure, and social participation.   COMORBIDITIES: may have co-morbidities  that affects occupational performance. Patient will benefit from skilled OT to address above impairments and improve overall function.  REHAB POTENTIAL: Good  PLAN:  OT FREQUENCY: 2x/week  OT DURATION: 12 weeks  PLANNED INTERVENTIONS: 97168 OT Re-evaluation, 97535 self care/ADL training, 02889 therapeutic exercise, 97530 therapeutic activity, 97112 neuromuscular re-education,  97140 manual therapy, 97035 ultrasound, 97018 paraffin, 02989 moist heat, 97010 cryotherapy, 97014 electrical stimulation unattended, 97750 Physical Performance Testing, 02239 Orthotic Initial, 97763 Orthotic/Prosthetic subsequent, passive range of motion, energy conservation, coping strategies training, patient/family education, and DME and/or AE instructions  RECOMMENDED OTHER SERVICES: n/a  CONSULTED AND AGREED WITH PLAN OF CARE: Patient  PLAN FOR NEXT SESSION:  splint check and modifications,  Progress HEP and strength per protocols Modalities as needed  Per protocol: hand-based custom-fabricated orthosis or a prefabricated neoprene prefabricated orthosis should be worn during the day with repetitive hand activity or activities requiring weighted resistance to the  hand and/or upper extremity.   Clarita LITTIE Pride, OT 01/12/2024, 9:03 AM

## 2024-01-14 ENCOUNTER — Ambulatory Visit: Payer: Self-pay | Admitting: Occupational Therapy

## 2024-01-18 ENCOUNTER — Telehealth: Payer: Self-pay | Admitting: Occupational Therapy

## 2024-01-18 ENCOUNTER — Ambulatory Visit: Admitting: Occupational Therapy

## 2024-01-18 NOTE — Telephone Encounter (Signed)
 This is to document my attempt to call patient after no-show for OT appt this PM.  This is patient's # 2nd missed appt.   Primary phone number(s) was used in efforts to contact the patient.   Voice mail was left requesting the patient call the clinic back at [phone] to confirm upcoming appointment and possibly make up this visit tomorrow.

## 2024-01-19 ENCOUNTER — Telehealth: Payer: Self-pay | Admitting: Pharmacist

## 2024-01-19 NOTE — Telephone Encounter (Signed)
 Assisted patient today with tegaderm overpatches for her libre 3 PLUS CGM.  Her libre 3 PLUS CGM has been approved by [employer] and should be shipping to her home.  She prefers patches to place over the sensors to avoid it failing off.  Encouraged patient to reach out as needed.  Haley Fuerstenberg Dattero Vaniya Augspurger, PharmD, BCACP, CPP Clinical Pharmacist, [facility]

## 2024-01-21 ENCOUNTER — Ambulatory Visit: Admitting: Occupational Therapy

## 2024-01-21 DIAGNOSIS — M6281 Muscle weakness (generalized): Secondary | ICD-10-CM

## 2024-01-21 DIAGNOSIS — R600 Localized edema: Secondary | ICD-10-CM

## 2024-01-21 DIAGNOSIS — M79642 Pain in left hand: Secondary | ICD-10-CM

## 2024-01-21 DIAGNOSIS — M25642 Stiffness of left hand, not elsewhere classified: Secondary | ICD-10-CM

## 2024-01-21 DIAGNOSIS — R278 Other lack of coordination: Secondary | ICD-10-CM

## 2024-01-21 NOTE — Patient Instructions (Addendum)
      Access Code: [email or web address] URL: [email or web address] Date: 01/21/2024 Prepared by: [email or web address] Enya Bureau  Exercises - Seated Thumb CMC Isometric Radial Abduction  - 2-3 x daily - 10 reps - Thumb Strengthening Stabilization CMC  - 2-3 x daily - 10 reps - Seated Isometric Thumb Extension with Manual Resistance  - 2-3 x daily - 10 reps - Putty Squeezes  - 1-2 x daily - 5-10 reps - Rolling Putty on Table  - 1-2 x daily - 5-10 reps - Finger Pinch and Pull with Putty  - 1-2 x daily - 510 reps - 3-Point Pinch with Putty  - 1-2 x daily - 5-10 reps - Removing Marbles from Putty  - 1-2 x daily - 10 reps

## 2024-01-21 NOTE — Therapy (Addendum)
 OUTPATIENT OCCUPATIONAL THERAPY ORTHO TREATMENT  Patient Name: [name] MRN: [id number] DOB:[birth year], 68 y.o., female Today's Date: 01/21/2024  PCP: Gladis Annita Quant FNP REFERRING PROVIDER: Dr. Arlinda  END OF SESSION:  OT End of Session - 01/21/24 1258     Visit Number 4    Number of Visits 25    Date for Recertification  03/09/24    Authorization Type 12 weeks    Progress Note Due on Visit 10    OT Start Time 1300    OT Stop Time 1400    OT Time Calculation (min) 60 min    Equipment Utilized During Treatment Fluido, yellow putty    Activity Tolerance Patient tolerated treatment well    Behavior During Therapy WFL for tasks assessed/performed          Past Medical History:  Diagnosis Date   Allergy    Anemia    Anginal pain    last time    Anxiety    Arthritis    RHEUMATOID   Asthma    Bipolar 1 disorder (HCC)    Blood transfusion without reported diagnosis    x 3   Cataracts, bilateral 07/2017   CHF (congestive heart failure) (HCC)    COPD (chronic obstructive pulmonary disease) (HCC)    Coronary artery disease    reported hx of MI;  Echo 2009 with normal LVF;  Myoview  05/2011: no ischemia   Depression    Diabetes mellitus without complication (HCC)    Dyslipidemia    Dysrhythmia    SVT   Elevated liver enzymes 06/30/2022   Esophageal stricture    Fibromyalgia    GERD (gastroesophageal reflux disease)    H/O hiatal hernia    Head injury, unspecified    Headache    migraines   Herpes simplex infection    History of kidney stones    History of loop recorder 08/10/2017   Managed by Dr. Cathlyn Birmingham   Hyperlipidemia    Hypertension    Insomnia    Myocardial infarction [facility])    age 83   Osteoporosis    Pneumonia    hx   Seizures (HCC)    none in the last 3-4 years as of 11/03/22 per patient   Shortness of breath    Sleep apnea    mild, does not require positive pressure treatment. (02/03/22)   Spinal stenosis of lumbar region     Spondylolisthesis    Status post placement of implantable loop recorder    Supraventricular tachycardia    Syncope and collapse    s/p ILR; no arhythmogenic cause identified   UTI (lower urinary tract infection)    Past Surgical History:  Procedure Laterality Date   BACK SURGERY     BREAST EXCISIONAL BIOPSY Right    1990s   BREAST SURGERY     lumpectomy   CARDIAC CATHETERIZATION  10/06/2011   CATARACT EXTRACTION W/PHACO Right 07/31/2017   Procedure: CATARACT EXTRACTION PHACO AND INTRAOCULAR LENS PLACEMENT (IOC);  Surgeon: Harrie Agent, MD;  Location: [facility];  Service: Ophthalmology;  Laterality: Right;  CDE: 2.33   CATARACT EXTRACTION W/PHACO Left 08/14/2017   Procedure: CATARACT EXTRACTION PHACO AND INTRAOCULAR LENS PLACEMENT (IOC);  Surgeon: Harrie Agent, MD;  Location: [facility];  Service: Ophthalmology;  Laterality: Left;  CDE: 2.74   CHOLECYSTECTOMY     CYSTOSCOPY     stone   DIAGNOSTIC LAPAROSCOPY     laparoscopic cholecystectomy   DOPPLER ECHOCARDIOGRAPHY  2009  ESOPHAGOGASTRODUODENOSCOPY N/A 10/31/2020   Procedure: ESOPHAGOGASTRODUODENOSCOPY (EGD);  Surgeon: Tanda Locus, MD;  Location: [facility];  Service: General;  Laterality: N/A;   EYE SURGERY Bilateral 08/14/2017   cataract removal   FINGER ARTHROPLASTY Left 11/27/2023   Procedure: ARTHROPLASTY, FINGER;  Surgeon: Arlinda Buster, MD;  Location: [facility];  Service: Orthopedics;  Laterality: Left;  LEFT THUMB CARPOMATACARPAL JOINT ARTHROPLASTY WITH INTERNAL BRACE AND METACARPOPHALANGEAL JOINT CAPSULODESIS PINNING   GLUTEUS MINIMUS REPAIR Left 11/04/2022   Procedure: LEFT GLUTEUS MEDIUS REPAIR;  Surgeon: Genelle Standing, MD;  Location: [facility];  Service: Orthopedics;  Laterality: Left;   head up tilt table testing  06/15/2007   Danelle Birmingham   HEMORRHOID SURGERY     HERNIA REPAIR     insertion of implatable loop recorder  08/11/2007   Danelle Birmingham   POSTERIOR CERVICAL FUSION/FORAMINOTOMY N/A 12/19/2013    Procedure: RIGHT C3-4.C4-5 AND C5-6 FORAMINOTOMIES;  Surgeon: Lynwood FORBES Better, MD;  Location: [facility];  Service: Orthopedics;  Laterality: N/A;   TOTAL HIP ARTHROPLASTY Left 01/31/2020   Procedure: LEFT TOTAL HIP ARTHROPLASTY ANTERIOR APPROACH;  Surgeon: Vernetta Lonni GRADE, MD;  Location: [facility];  Service: Orthopedics;  Laterality: Left;   TOTAL SHOULDER ARTHROPLASTY Right 11/15/2019   Procedure: RIGHT TOTAL SHOULDER ARTHROPLASTY;  Surgeon: Addie Cordella Hamilton, MD;  Location: [facility];  Service: Orthopedics;  Laterality: Right;   TOTAL SHOULDER REVISION Right 06/11/2021   Procedure: RIGHT SHOULDER CONVERSION TOTAL SHOULDER ARTHROPLASTY to REVERSE TOTAL SHOULDER ARTHROPLASTY;  Surgeon: Addie Cordella Hamilton, MD;  Location: [facility] OR;  Service: Orthopedics;  Laterality: Right;   TUBAL LIGATION     UPPER GASTROINTESTINAL ENDOSCOPY     XI ROBOTIC ASSISTED HIATAL HERNIA REPAIR N/A 10/31/2020   Procedure: XI ROBOTIC ASSISTED HIATAL HERNIA REPAIR WITH PARTIAL FUNDOPLICATION;  Surgeon: Tanda Locus, MD;  Location: [facility];  Service: General;  Laterality: N/A;   Patient Active Problem List   Diagnosis Date Noted   Arthritis of carpometacarpal [facility]) joint of left thumb 11/27/2023   Gonorrhea 12/03/2022   Acute cystitis with hematuria 11/26/2022   Tear of left gluteus minimus tendon 11/04/2022   Instability of prosthetic shoulder joint    S/P reverse total shoulder arthroplasty, right 06/11/2021   Hypokalemia 11/25/2020   S/P laparoscopic fundoplication 10/31/2020   Status post total replacement of left hip 01/31/2020   Unilateral primary osteoarthritis, left hip 12/21/2019   S/P shoulder replacement, right 11/15/2019   Diverticulosis 04/14/2019   Chronic diastolic CHF (congestive heart failure) (HCC) 04/14/2019   Unstable angina (HCC) 10/20/2017   Chronic pain 09/23/2016   Pre-diabetes 09/02/2016   Recurrent major depressive disorder, in partial remission 07/14/2016   Seizures (HCC) 07/14/2016   GAD  (generalized anxiety disorder) 07/14/2016   Insomnia 09/05/2014   Allergic rhinitis 09/05/2014   Cervical spondylosis without myelopathy 12/19/2013    Class: Chronic   Neural foraminal stenosis of cervical spine 12/19/2013   Cervical radiculitis 09/19/2013   Lumbosacral spondylosis without myelopathy 11/16/2012   Postlaminectomy syndrome, lumbar region 11/16/2012   Herpes simplex virus (HSV) infection 10/31/2008   Hyperlipidemia with target LDL less than 100 10/31/2008   Primary hypertension 10/31/2008   GERD 10/31/2008   SPINAL STENOSIS OF LUMBAR REGION 10/31/2008   Osteoporosis 10/31/2008   SPONDYLOLISTHESIS 10/31/2008   SYNCOPE 10/31/2008   Sleep apnea 10/31/2008    ONSET DATE: 11/27/23  REFERRING DIAG: M18.12 (ICD-10-CM) - Primary osteoarthritis of first carpometacarpal joint of left hand    THERAPY DIAG:  Pain in left  hand  Stiffness of left hand, not elsewhere classified  Muscle weakness (generalized)  Localized edema  Other lack of coordination  Rationale for Evaluation and Treatment: Rehabilitation  SUBJECTIVE:   SUBJECTIVE STATEMENT: Pt reports she is currently wearing her hand based splint during the daytime but leaves it off at night. Pt also reported that she has more ROM with touching her thumb to her pinky but when doing so, it hurts down in the wrist area. She also reports that it hurts in the same area when holding her phone or using her hand to do certain activities. However, even at rest pt notices slight pain.She has been using both heat and ice to help with alleviating pain.   At worse pain has been a 4/10.  Pt accompanied by: self  PERTINENT HISTORY:  Pt with L thumb carpometacarpal arthritis with associated MCP hyperextension. pt underwent the following procedures on 11/27/23:  Left thumb carpometacarpal arthroplasty Left tendon transfer abductor pollicis longus to flexor carpi radialis Left partial trapezoid ectomy Left de Quervain release  surgery Left thumb MCP capsulodesis and pinning  PMHx: Reverse tSA,anxiety, arthritis , bipolar CHF, COPD, CAD depression, DM fribromyalgia, head injury, MI , seizure ,back surg, L THA   Pt went to MD on 12/30/23 Plan: Pin removed today without incident.  Continue with removable splint fabricated by OT.  Continue with range of motion exercises for 4 weeks.  Follow-up at that juncture for recheck and likely progression of strengthening.   PRECAUTIONS: Other: splint at all times, wait to begin ROM after pin removed , hx of seizures  RED FLAGS: None   WEIGHT BEARING RESTRICTIONS: Yes NWB to LUE  PAIN: Thumb 2/10 Wrist 3/10 Are you having pain? Yes: NPRS scale: 2-3/10 Pain location: L hand Pain description: stiff and muscles are sore Aggravating factors: using it Relieving factors: resting, does take meds, heat/ice   FALLS: Has patient fallen in last 6 months? Yes. Number of falls 2 since surgery; fell this weekend - moving a laundry basket and fell - didn't have her splint on.  LIVING ENVIRONMENT: Lives with: lives with their family   PLOF: Independent  PATIENT GOALS: improve functional use and pain in LUE  NEXT MD VISIT: 02/01/24  OBJECTIVE:  Note: Objective measures were completed at Evaluation unless otherwise noted.  HAND DOMINANCE: Right  ADLs:Pt is currently using her RUE for ADLs   FUNCTIONAL OUTCOME MEASURES: Quick Dash: 93% disability  UPPER EXTREMITY ROM:   NT due to precautions  Active ROM Right eval Left eval  Shoulder flexion    Shoulder abduction    Shoulder adduction    Shoulder extension    Shoulder internal rotation    Shoulder external rotation    Elbow flexion    Elbow extension    Wrist flexion    Wrist extension    Wrist ulnar deviation    Wrist radial deviation    Wrist pronation    Wrist supination    (Blank rows = not tested)  Active ROM Right eval Left eval  Thumb MCP (0-60)    Thumb IP (0-80)    Thumb Radial abd/add (0-55)      Thumb Palmar abd/add (0-45)     Thumb Opposition to Small Finger     Index MCP (0-90)     Index PIP (0-100)     Index DIP (0-70)      Long MCP (0-90)      Long PIP (0-100)      Long DIP (  0-70)      Ring MCP (0-90)      Ring PIP (0-100)      Ring DIP (0-70)      Little MCP (0-90)      Little PIP (0-100)      Little DIP (0-70)      (Blank rows = not tested)  HAND FUNCTION: 01/21/24- Grip strength: Right:33.9,42.5, 46.7 lbs  Left: 15.4, 20.7, 23.1 lbs Average-  41 lbs ( R);  19.7 lbs (L)   COORDINATION:  01/21/24 9-hole peg test: Right 26 seconds ;  Left 31 seconds  SENSATION: Not tested  EDEMA: moderate  COGNITION: Overall cognitive status: Within functional limits for tasks assessed   OBSERVATIONS: Pleasant female arrived with hand wrapped in temporary  thumb spica cast . Moderate edema at dorsal hand when cast was removed pt reports cast was too tight and rubbing her.    TREATMENT DATE: 01/21/24    - Therapeutic exercises completed for duration as noted below including:  Pt placed L UE in Fluidotherapy machine with supervised ROM x 10 min. Pt was educated to complete gentle AROM tendon glides during modality time to improve ROM and decrease pain/stiffness of affected extremity by use of the machine's massaging action and thermal properties.    Isometric exercises were trialled due to missing addition of this exercises at previous appt/no-show. Pt was encouraged to use her R hand to block L thumb joints promoting strengthening of thenar muscles. Exercises included the following;  - Seated Thumb CMC Isometric Radial Abduction  - Thumb Strengthening Stabilization CMC   - Seated Isometric Thumb Extension with Manual Resistance    No concerns reported, therefore initiated light Putty Exercises with yellow putty to begin strengthening, coordination and sensory stimulation of L UE.  Patient provided visual demonstration, verbal and tactile cues as needed to improve performance  of the various exercises/activities including:   - Putty Squeezes - cues to squeeze putty into log for use with other exercises and to fold putty in half with 1 hand  - Putty Rolls - encourage to roll putty into logs with sensory stimulation to entire length of hand, fingers and wrist as needed   - Pinch and Pull with Putty - different pinches (3-Point Pinch, Tip Pinch, Key Pinch) - patient encouraged to combine tripod, pincer and/or key pinch with pinch and pull motion of putty pulling away from midline, changing between different pinches and changing different directions to change grip. At this time, pt has been instructed to only complete the 3-point pinch as slight pain and discomfort was reported when engaging in both the tip pinch and key pinch. OT informed pt that once pain has decreased and become manageable in the future, then pt may try to engage in the other two pinches but for now pt should refrain from doing so.   - Removing Objects from Putty  - encouraged to hide items (coins, marble, dice etc) and use one hand at a time to find the objects and identify them by tactile input before s/he digs them out and can see them visually.  OT educated patient on theraputty recommendations: avoid small children, pets, hot environments, place in designated container and avoid contact with fabrics. Patient verbalized understanding.    Patient benefited from extra time, verbal/tactile cues, and modeling of task to allow time for processing of verbal instructions and improve motor planning of unfamiliar movements.   - Self-care/home management completed for duration as noted below including:  OT educated pt  on joint protection, ergonomics, and Optometrist principles as noted in pt instructions as needed to improve L UE pain. Pt was educated to be mindful of her hand positioning and how she is using her hands to prevent any further strain or pain. Pt also encouraged to always use two hands when  handling objects, holding them closer to her body instead of further away.Pt also encouraged to pace herself when doing repetitive task, and knowing when to listen to her body to take breaks and return to task at a later time. Pt verbalized understanding.   Education provided from protocol re: hand-based custom-fabricated orthosis or a prefabricated neoprene prefabricated orthosis should be worn during the day with repetitive hand activity or activities requiring weighted resistance to the hand and/or upper extremity. OT also introduced pt to possible splint options such as the Comfort Cool thumb CMC splint for possible purchase from [employer] or [employer] to help with wrist and thumb stabilization, aiming to prevent further pain or strain on joints, tendons, and nerves. Pt noted she may have one of these at home and will look to see.  In the meantime, pt is wearing her custom [employer] thumb spica splint.    PATIENT EDUCATION: Education details: Continued HEP, splint options, Joint protection Person educated: Patient Education method: Explanation, Demonstration, Tactile cues, Verbal cues, and Handouts Education comprehension: verbalized understanding, returned demonstration, verbal cues required, tactile cues required, and needs further education  HOME EXERCISE PROGRAM: 01/05/24: Tendon glide, thumb opposition, wrist flex/ext and scar massage 01/21/24: Joint protection, putty exercises (yellow), isometric exercises  GOALS: Goals reviewed with patient? Yes  SHORT TERM GOALS: Target date: 01/14/24  I with splint wear, care and precautions.  Goal status: MET  2.  I with inital HEP.  Goal status: MET  3.  I with edema control techniques.  Goal status: MET  4.  Pt will increase thumb MP flexion by 10* from inital measurement for increased functional use Baseline: NT due to prec. Goal status: IN Progress  5.  Pt will increase thumb IP flexion by 10* from inital measurement Baseline: NT due to  prec. Goal status: IN Progress    LONG TERM GOALS: Target date: 03/09/24  I with updated HEP.  Goal status: IN Progress  2.  Pt will demonstrate improved LUE functional use as evidenced by improving Quick DASH score to 75% disability or better. Baseline: 93% disbaility Goal status: IN Progress  3.  Pt will demonstrate grip strength of 25# or greater for increased functional use.  Goal status: IN prorgress  4.  Pt will resume use of LUE as a non dominant assist for ADLs with pain no greater than 3/10.  Goal status: IN Progress  5.  Pt will demonstrate ability to oppose 5th digit with thumb for increased functional use of LUE. Baseline: NT Goal status: IN Progress  ASSESSMENT:  CLINICAL IMPRESSION: Pt presents with L radial pain s/p multiple procedures for OA L thumb ie) Left thumb carpometacarpal arthroplasty, Left tendon transfer abductor pollicis longus to flexor carpi radialis, Left partial trapezoidectomy, Left de Quervain release surgery, Left thumb MCP capsulodesis and pinning. She demonstrates good rehab potential as evidence to verbalizing understanding of her HEP and carryover for protecting her joints at home. She will continue to benefit from skilled outpatient OT to improve functional use of L UE for ADLs and IADLs.   PERFORMANCE DEFICITS: in functional skills including ADLs, IADLs, coordination, dexterity, sensation, edema, ROM, strength, pain, flexibility, Fine motor control, Gross motor  control, decreased knowledge of precautions, wound, skin integrity, and UE functional use, and psychosocial skills including coping strategies, environmental adaptation, habits, interpersonal interactions, and routines and behaviors.   IMPAIRMENTS: are limiting patient from ADLs, IADLs, rest and sleep, education, play, leisure, and social participation.   COMORBIDITIES: may have co-morbidities  that affects occupational performance. Patient will benefit from skilled OT to address  above impairments and improve overall function.  REHAB POTENTIAL: Good  PLAN:  OT FREQUENCY: 2x/week  OT DURATION: 12 weeks  PLANNED INTERVENTIONS: 97168 OT Re-evaluation, 97535 self care/ADL training, 02889 therapeutic exercise, 97530 therapeutic activity, 97112 neuromuscular re-education, 97140 manual therapy, 97035 ultrasound, 97018 paraffin, 02989 moist heat, 97010 cryotherapy, 97014 electrical stimulation unattended, 97750 Physical Performance Testing, 02239 Orthotic Initial, 97763 Orthotic/Prosthetic subsequent, passive range of motion, energy conservation, coping strategies training, patient/family education, and DME and/or AE instructions  RECOMMENDED OTHER SERVICES: n/a  CONSULTED AND AGREED WITH PLAN OF CARE: Patient  PLAN FOR NEXT SESSION:  splint check and modifications PRN Progress HEP and strength per protocols - pg 37 Modalities as needed  Breshay Ilg, Student-OT 01/21/2024, 2:33 PM

## 2024-01-25 ENCOUNTER — Ambulatory Visit: Admitting: Occupational Therapy

## 2024-01-25 DIAGNOSIS — M79642 Pain in left hand: Secondary | ICD-10-CM

## 2024-01-25 DIAGNOSIS — M6281 Muscle weakness (generalized): Secondary | ICD-10-CM

## 2024-01-25 DIAGNOSIS — R29898 Other symptoms and signs involving the musculoskeletal system: Secondary | ICD-10-CM

## 2024-01-25 DIAGNOSIS — R278 Other lack of coordination: Secondary | ICD-10-CM

## 2024-01-25 DIAGNOSIS — M25642 Stiffness of left hand, not elsewhere classified: Secondary | ICD-10-CM

## 2024-01-25 NOTE — Therapy (Signed)
 OUTPATIENT OCCUPATIONAL THERAPY ORTHO TREATMENT  Patient Name: [name] MRN: [id number] DOB:11/20/1955, 68 y.o., female Today's Date: 01/25/2024  PCP: Gladis Annita Quant FNP REFERRING PROVIDER: Dr. Arlinda  END OF SESSION:  OT End of Session - 01/25/24 1229     Visit Number 5    Number of Visits 25    Date for Recertification  03/09/24    Authorization Type 12 weeks    Progress Note Due on Visit 10    OT Start Time 1230    OT Stop Time 1315    OT Time Calculation (min) 45 min    Equipment Utilized During Treatment Fluido    Activity Tolerance Patient tolerated treatment well    Behavior During Therapy WFL for tasks assessed/performed          Past Medical History:  Diagnosis Date   Allergy    Anemia    Anginal pain    last time    Anxiety    Arthritis    RHEUMATOID   Asthma    Bipolar 1 disorder (HCC)    Blood transfusion without reported diagnosis    x 3   Cataracts, bilateral 07/2017   CHF (congestive heart failure) (HCC)    COPD (chronic obstructive pulmonary disease) (HCC)    Coronary artery disease    reported hx of MI;  Echo 2009 with normal LVF;  Myoview  05/2011: no ischemia   Depression    Diabetes mellitus without complication (HCC)    Dyslipidemia    Dysrhythmia    SVT   Elevated liver enzymes 06/30/2022   Esophageal stricture    Fibromyalgia    GERD (gastroesophageal reflux disease)    H/O hiatal hernia    Head injury, unspecified    Headache    migraines   Herpes simplex infection    History of kidney stones    History of loop recorder 08/10/2017   Managed by Dr. Cathlyn Birmingham   Hyperlipidemia    Hypertension    Insomnia    Myocardial infarction [facility])    age 41   Osteoporosis    Pneumonia    hx   Seizures (HCC)    none in the last 3-4 years as of 11/03/22 per patient   Shortness of breath    Sleep apnea    mild, does not require positive pressure treatment. (02/03/22)   Spinal stenosis of lumbar region     Spondylolisthesis    Status post placement of implantable loop recorder    Supraventricular tachycardia    Syncope and collapse    s/p ILR; no arhythmogenic cause identified   UTI (lower urinary tract infection)    Past Surgical History:  Procedure Laterality Date   BACK SURGERY     BREAST EXCISIONAL BIOPSY Right    1990s   BREAST SURGERY     lumpectomy   CARDIAC CATHETERIZATION  10/06/2011   CATARACT EXTRACTION W/PHACO Right 07/31/2017   Procedure: CATARACT EXTRACTION PHACO AND INTRAOCULAR LENS PLACEMENT (IOC);  Surgeon: Harrie Agent, MD;  Location: [facility];  Service: Ophthalmology;  Laterality: Right;  CDE: 2.33   CATARACT EXTRACTION W/PHACO Left 08/14/2017   Procedure: CATARACT EXTRACTION PHACO AND INTRAOCULAR LENS PLACEMENT (IOC);  Surgeon: Harrie Agent, MD;  Location: [facility];  Service: Ophthalmology;  Laterality: Left;  CDE: 2.74   CHOLECYSTECTOMY     CYSTOSCOPY     stone   DIAGNOSTIC LAPAROSCOPY     laparoscopic cholecystectomy   DOPPLER ECHOCARDIOGRAPHY  2009   ESOPHAGOGASTRODUODENOSCOPY N/A  10/31/2020   Procedure: ESOPHAGOGASTRODUODENOSCOPY (EGD);  Surgeon: Tanda Locus, MD;  Location: [facility];  Service: General;  Laterality: N/A;   EYE SURGERY Bilateral 08/14/2017   cataract removal   FINGER ARTHROPLASTY Left 11/27/2023   Procedure: ARTHROPLASTY, FINGER;  Surgeon: Arlinda Buster, MD;  Location:  SURGERY CENTER;  Service: Orthopedics;  Laterality: Left;  LEFT THUMB CARPOMATACARPAL JOINT ARTHROPLASTY WITH INTERNAL BRACE AND METACARPOPHALANGEAL JOINT CAPSULODESIS PINNING   GLUTEUS MINIMUS REPAIR Left 11/04/2022   Procedure: LEFT GLUTEUS MEDIUS REPAIR;  Surgeon: Genelle Standing, MD;  Location: [facility] OR;  Service: Orthopedics;  Laterality: Left;   head up tilt table testing  06/15/2007   Danelle Birmingham   HEMORRHOID SURGERY     HERNIA REPAIR     insertion of implatable loop recorder  08/11/2007   Danelle Birmingham   POSTERIOR CERVICAL FUSION/FORAMINOTOMY N/A 12/19/2013    Procedure: RIGHT C3-4.C4-5 AND C5-6 FORAMINOTOMIES;  Surgeon: Lynwood FORBES Better, MD;  Location: [facility] OR;  Service: Orthopedics;  Laterality: N/A;   TOTAL HIP ARTHROPLASTY Left 01/31/2020   Procedure: LEFT TOTAL HIP ARTHROPLASTY ANTERIOR APPROACH;  Surgeon: Vernetta Lonni GRADE, MD;  Location: [facility] OR;  Service: Orthopedics;  Laterality: Left;   TOTAL SHOULDER ARTHROPLASTY Right 11/15/2019   Procedure: RIGHT TOTAL SHOULDER ARTHROPLASTY;  Surgeon: Addie Cordella Hamilton, MD;  Location: [facility];  Service: Orthopedics;  Laterality: Right;   TOTAL SHOULDER REVISION Right 06/11/2021   Procedure: RIGHT SHOULDER CONVERSION TOTAL SHOULDER ARTHROPLASTY to REVERSE TOTAL SHOULDER ARTHROPLASTY;  Surgeon: Addie Cordella Hamilton, MD;  Location: [facility] OR;  Service: Orthopedics;  Laterality: Right;   TUBAL LIGATION     UPPER GASTROINTESTINAL ENDOSCOPY     XI ROBOTIC ASSISTED HIATAL HERNIA REPAIR N/A 10/31/2020   Procedure: XI ROBOTIC ASSISTED HIATAL HERNIA REPAIR WITH PARTIAL FUNDOPLICATION;  Surgeon: Tanda Locus, MD;  Location: [facility];  Service: General;  Laterality: N/A;   Patient Active Problem List   Diagnosis Date Noted   Arthritis of carpometacarpal [facility]) joint of left thumb 11/27/2023   Gonorrhea 12/03/2022   Acute cystitis with hematuria 11/26/2022   Tear of left gluteus minimus tendon 11/04/2022   Instability of prosthetic shoulder joint    S/P reverse total shoulder arthroplasty, right 06/11/2021   Hypokalemia 11/25/2020   S/P laparoscopic fundoplication 10/31/2020   Status post total replacement of left hip 01/31/2020   Unilateral primary osteoarthritis, left hip 12/21/2019   S/P shoulder replacement, right 11/15/2019   Diverticulosis 04/14/2019   Chronic diastolic CHF (congestive heart failure) (HCC) 04/14/2019   Unstable angina (HCC) 10/20/2017   Chronic pain 09/23/2016   Pre-diabetes 09/02/2016   Recurrent major depressive disorder, in partial remission 07/14/2016   Seizures (HCC) 07/14/2016   GAD  (generalized anxiety disorder) 07/14/2016   Insomnia 09/05/2014   Allergic rhinitis 09/05/2014   Cervical spondylosis without myelopathy 12/19/2013    Class: Chronic   Neural foraminal stenosis of cervical spine 12/19/2013   Cervical radiculitis 09/19/2013   Lumbosacral spondylosis without myelopathy 11/16/2012   Postlaminectomy syndrome, lumbar region 11/16/2012   Herpes simplex virus (HSV) infection 10/31/2008   Hyperlipidemia with target LDL less than 100 10/31/2008   Primary hypertension 10/31/2008   GERD 10/31/2008   SPINAL STENOSIS OF LUMBAR REGION 10/31/2008   Osteoporosis 10/31/2008   SPONDYLOLISTHESIS 10/31/2008   SYNCOPE 10/31/2008   Sleep apnea 10/31/2008    ONSET DATE: 11/27/23  REFERRING DIAG: M18.12 (ICD-10-CM) - Primary osteoarthritis of first carpometacarpal joint of left hand    THERAPY DIAG:  No diagnosis found.  Rationale  for Evaluation and Treatment: Rehabilitation  SUBJECTIVE:   SUBJECTIVE STATEMENT:  Pt reports that the weather (rain and cold) have been affecting her today.  Pain has been at 1-2 thumb/hand respectively but today they are at 2-3 today.  Pt had her cool comfort hand based spica splint on today.  She noted some swelling at the base of her thumb but admitted she did left something heavy recently which may have aggravated it.  Pt accompanied by: self  PERTINENT HISTORY:  Pt with L thumb carpometacarpal arthritis with associated MCP hyperextension. pt underwent the following procedures on 11/27/23:  Left thumb carpometacarpal arthroplasty Left tendon transfer abductor pollicis longus to flexor carpi radialis Left partial trapezoid ectomy Left de Quervain release surgery Left thumb MCP capsulodesis and pinning  PMHx: Reverse tSA,anxiety, arthritis , bipolar CHF, COPD, CAD depression, DM fribromyalgia, head injury, MI , seizure ,back surg, L THA   Pt went to MD on 12/30/23 Plan: Pin removed today without incident.  Continue with removable  splint fabricated by OT.  Continue with range of motion exercises for 4 weeks.  Follow-up at that juncture for recheck and likely progression of strengthening.   PRECAUTIONS: Other: splint at all times, wait to begin ROM after pin removed , hx of seizures  RED FLAGS: None   WEIGHT BEARING RESTRICTIONS: Yes NWB to LUE  PAIN: Thumb 2/10 Hand 3/10 Are you having pain? Yes: NPRS scale: 2-3/10 Pain location: L hand Pain description: stiff and muscles are sore Aggravating factors: using it Relieving factors: resting, does take meds, heat/ice   FALLS: Has patient fallen in last 6 months? Yes. Number of falls 2 since surgery; fell this weekend - moving a laundry basket and fell - didn't have her splint on.  LIVING ENVIRONMENT: Lives with: lives with their family   PLOF: Independent  PATIENT GOALS: improve functional use and pain in LUE  NEXT MD VISIT: 02/01/24  OBJECTIVE:  Note: Objective measures were completed at Evaluation unless otherwise noted.  HAND DOMINANCE: Right  ADLs:Pt is currently using her RUE for ADLs   FUNCTIONAL OUTCOME MEASURES: Quick Dash: 93% disability  UPPER EXTREMITY ROM:   NT due to precautions  Active ROM Right eval Left eval  Shoulder flexion    Shoulder abduction    Shoulder adduction    Shoulder extension    Shoulder internal rotation    Shoulder external rotation    Elbow flexion    Elbow extension    Wrist flexion    Wrist extension    Wrist ulnar deviation    Wrist radial deviation    Wrist pronation    Wrist supination    (Blank rows = not tested)  Active ROM Right eval Left eval  Thumb MCP (0-60)    Thumb IP (0-80)    Thumb Radial abd/add (0-55)     Thumb Palmar abd/add (0-45)     Thumb Opposition to Small Finger     Index MCP (0-90)     Index PIP (0-100)     Index DIP (0-70)      Long MCP (0-90)      Long PIP (0-100)      Long DIP (0-70)      Ring MCP (0-90)      Ring PIP (0-100)      Ring DIP (0-70)      Little  MCP (0-90)      Little PIP (0-100)      Little DIP (0-70)      (  Blank rows = not tested)  HAND FUNCTION: 01/21/24- Grip strength: Right:33.9,42.5, 46.7 lbs  Left: 15.4, 20.7, 23.1 lbs Average-  41 lbs ( R);  19.7 lbs (L)   COORDINATION:  01/21/24 9-hole peg test: Right 26 seconds ;  Left 31 seconds  SENSATION: Not tested  EDEMA: moderate  COGNITION: Overall cognitive status: Within functional limits for tasks assessed   OBSERVATIONS: Pleasant female arrived with hand wrapped in temporary  thumb spica cast . Moderate edema at dorsal hand when cast was removed pt reports cast was too tight and rubbing her.    TREATMENT DATE: 01/21/24    - Therapeutic activities completed for duration as noted below including:  Coordination Exercise/Activity handout with images provided for various activities to work on L UE thumb/finger ROM, dexterity and isolated movements with demonstration and practice, as well as modification, hand over hand guidance and cues throughout to improve technique, digital isolation and ease of performing task.  Tasks included:  Pick up coins, dominoes, buttons, marbles, dried beans/pasta of different sizes ... To place in containers To stack - with guidance to work on include/isolate specific fingers. To pick up items one at a time until patient got 5+ in her hand and then move item from palm to fingertips to release ie) Finger-to-palm then palm-to-finger translation of small items - Options to vary difficulty include using a washcloth under items like coins or using larger items (checkers vs coins or blocks/dominos vs dice) for increased ease of picking up items.  Shuffling, Flipping and dealing cards 1 at a time. -- Setup patient to work on shuffling and dealing cards, focusing on using thumb to gently push off 1 card at a time from the top of the deck.    Rotate golf balls (clockwise and counter-clockwise) with forearm pronated and balls on table or supinated and  balls in hand.   Twirl pen/cil between fingers. - Encouragement to isolate fingers individually and twirl (rotation) or flipping and shift up and down the pen (translation) to get it in position for writing or erasing.    Tear a piece of paper towel and roll it into small balls with fingertips ie) straw wrapping when eating out.    Patient is encouraged to take breaks, relax arm/shoulder by supporting forearm, minimize compensatory motions and a try different activities throughout the day/week including games like Londa (for the dice), card games, Connect 4 etc.   Patient benefited from extra time, verbal/tactile cues, and modeling of task to allow time for processing of verbal instructions and improve motor planning of unfamiliar movements.    In light of reporting discomfort after lifting a heavy pet product, OT reviewed education pt on joint protection, ergonomics, and optometrist principles for long-term protection of her hands, as well as options to minimize need to buy things and get them in and out of her car ie) using [employer] etc.  - Therapeutic exercises completed for duration as noted below including:  Pt placed LUE in Fluidotherapy machine with supervised ROM x 10 min. Pt was educated to complete AROM during modality time to improve thumb motions and decrease pain/stiffness of affected extremity by use of the machine's massaging action and thermal properties.   PATIENT EDUCATION: Education details: Psychiatrist Person educated: Patient Education method: Explanation, Demonstration, Tactile cues, Verbal cues, and Handouts Education comprehension: verbalized understanding, returned demonstration, verbal cues required, tactile cues required, and needs further education  HOME EXERCISE PROGRAM: 01/05/24: Tendon glide, thumb opposition, wrist flex/ext and scar  massage 01/21/24: Joint protection, putty exercises (yellow), isometric exercises 01/25/24: Coordination  activities  GOALS: Goals reviewed with patient? Yes  SHORT TERM GOALS: Target date: 01/14/24  I with splint wear, care and precautions. Goal status: MET  2.  I with inital HEP. Goal status: MET  3.  I with edema control techniques. Goal status: MET  4.  Pt will increase thumb MP flexion by 10* from inital measurement for increased functional use Baseline: NT due to prec. Goal status: IN Progress  5.  Pt will increase thumb IP flexion by 10* from inital measurement Baseline: NT due to prec. Goal status: IN Progress    LONG TERM GOALS: Target date: 03/09/24  I with updated HEP. Goal status: IN Progress  2.  Pt will demonstrate improved LUE functional use as evidenced by improving Quick DASH score to 75% disability or better. Baseline: 93% disbaility Goal status: IN Progress  3.  Pt will demonstrate grip strength of 25# or greater for increased functional use. 01/21/24: Average-  41 lbs ( R);  19.7 lbs (L) Goal status: IN prorgress  4.  Pt will resume use of LUE as a non dominant assist for ADLs with pain no greater than 3/10. Goal status: IN Progress  5.  Pt will demonstrate ability to oppose 5th digit with thumb for increased functional use of LUE. Baseline: NT Goal status: IN Progress  ASSESSMENT:  CLINICAL IMPRESSION: Pt presents with L hand weakness and incoordination s/p multiple procedures for OA L thumb ie) Left thumb carpometacarpal arthroplasty, Left tendon transfer abductor pollicis longus to flexor carpi radialis, Left partial trapezoidectomy, Left de Quervain release surgery, Left thumb MCP capsulodesis and pinning. She demonstrates good participation in OT activities to improve ROM, coordination and functional use of L thumb with verbal understanding of her HEP and joint protection but tendency still to overdo it at home. She will continue to benefit from skilled outpatient OT to improve functional use of L UE for ADLs and IADLs.   PERFORMANCE DEFICITS:  in functional skills including ADLs, IADLs, coordination, dexterity, sensation, edema, ROM, strength, pain, flexibility, Fine motor control, Gross motor control, decreased knowledge of precautions, wound, skin integrity, and UE functional use, and psychosocial skills including coping strategies, environmental adaptation, habits, interpersonal interactions, and routines and behaviors.   IMPAIRMENTS: are limiting patient from ADLs, IADLs, rest and sleep, education, play, leisure, and social participation.   COMORBIDITIES: may have co-morbidities  that affects occupational performance. Patient will benefit from skilled OT to address above impairments and improve overall function.  REHAB POTENTIAL: Good  PLAN:  OT FREQUENCY: 2x/week  OT DURATION: 12 weeks  PLANNED INTERVENTIONS: 97168 OT Re-evaluation, 97535 self care/ADL training, 02889 therapeutic exercise, 97530 therapeutic activity, 97112 neuromuscular re-education, 97140 manual therapy, 97035 ultrasound, 97018 paraffin, 02989 moist heat, 97010 cryotherapy, 97014 electrical stimulation unattended, 97750 Physical Performance Testing, 02239 Orthotic Initial, 97763 Orthotic/Prosthetic subsequent, passive range of motion, energy conservation, coping strategies training, patient/family education, and DME and/or AE instructions  RECOMMENDED OTHER SERVICES: n/a  CONSULTED AND AGREED WITH PLAN OF CARE: Patient  PLAN FOR NEXT SESSION:  splint check and modifications PRN Progress HEP and strength per protocols - pg 37 Modalities as needed  Clarita LITTIE Pride, OT 01/25/2024, 12:39 PM

## 2024-01-28 ENCOUNTER — Ambulatory Visit: Admitting: Occupational Therapy

## 2024-01-28 DIAGNOSIS — M79642 Pain in left hand: Secondary | ICD-10-CM | POA: Diagnosis not present

## 2024-01-28 DIAGNOSIS — M25642 Stiffness of left hand, not elsewhere classified: Secondary | ICD-10-CM

## 2024-01-28 DIAGNOSIS — R278 Other lack of coordination: Secondary | ICD-10-CM

## 2024-01-28 DIAGNOSIS — M6281 Muscle weakness (generalized): Secondary | ICD-10-CM

## 2024-01-28 DIAGNOSIS — R600 Localized edema: Secondary | ICD-10-CM

## 2024-01-28 NOTE — Patient Instructions (Addendum)
 Access Code: [email or web address] URL: [email or web address] Date: 01/28/2024 Prepared by: [email or web address] Reddicks  Exercises - Thumb Abduction AROM on Table  - 1-2 x daily - 10 reps - Seated Thumb Extension  - 1-2 x daily - 10 reps - Thumb Strengthening Stabilization CMC  - 1-2 x daily - 10 reps

## 2024-01-28 NOTE — Therapy (Addendum)
 OUTPATIENT OCCUPATIONAL THERAPY ORTHO TREATMENT  Patient Name: [name] MRN: [id number] DOB:04-21-1955, 68 y.o., female Today's Date: 01/28/2024  PCP: Gladis Annita Quant FNP REFERRING PROVIDER: Dr. Arlinda  END OF SESSION:  OT End of Session - 01/28/24 0842     Visit Number 6    Number of Visits 25    Date for Recertification  03/09/24    Authorization Type 12 weeks    Progress Note Due on Visit 10    OT Start Time 0845    OT Stop Time 0932    OT Time Calculation (min) 47 min    Equipment Utilized During Treatment Fluido, chain game    Activity Tolerance Patient tolerated treatment well    Behavior During Therapy WFL for tasks assessed/performed          Past Medical History:  Diagnosis Date   Allergy    Anemia    Anginal pain    last time    Anxiety    Arthritis    RHEUMATOID   Asthma    Bipolar 1 disorder (HCC)    Blood transfusion without reported diagnosis    x 3   Cataracts, bilateral 07/2017   CHF (congestive heart failure) (HCC)    COPD (chronic obstructive pulmonary disease) (HCC)    Coronary artery disease    reported hx of MI;  Echo 2009 with normal LVF;  Myoview  05/2011: no ischemia   Depression    Diabetes mellitus without complication (HCC)    Dyslipidemia    Dysrhythmia    SVT   Elevated liver enzymes 06/30/2022   Esophageal stricture    Fibromyalgia    GERD (gastroesophageal reflux disease)    H/O hiatal hernia    Head injury, unspecified    Headache    migraines   Herpes simplex infection    History of kidney stones    History of loop recorder 08/10/2017   Managed by Dr. Cathlyn Birmingham   Hyperlipidemia    Hypertension    Insomnia    Myocardial infarction [facility])    age 3   Osteoporosis    Pneumonia    hx   Seizures (HCC)    none in the last 3-4 years as of 11/03/22 per patient   Shortness of breath    Sleep apnea    mild, does not require positive pressure treatment. (02/03/22)   Spinal stenosis of lumbar region     Spondylolisthesis    Status post placement of implantable loop recorder    Supraventricular tachycardia    Syncope and collapse    s/p ILR; no arhythmogenic cause identified   UTI (lower urinary tract infection)    Past Surgical History:  Procedure Laterality Date   BACK SURGERY     BREAST EXCISIONAL BIOPSY Right    1990s   BREAST SURGERY     lumpectomy   CARDIAC CATHETERIZATION  10/06/2011   CATARACT EXTRACTION W/PHACO Right 07/31/2017   Procedure: CATARACT EXTRACTION PHACO AND INTRAOCULAR LENS PLACEMENT (IOC);  Surgeon: Harrie Agent, MD;  Location: [facility];  Service: Ophthalmology;  Laterality: Right;  CDE: 2.33   CATARACT EXTRACTION W/PHACO Left 08/14/2017   Procedure: CATARACT EXTRACTION PHACO AND INTRAOCULAR LENS PLACEMENT (IOC);  Surgeon: Harrie Agent, MD;  Location: [facility];  Service: Ophthalmology;  Laterality: Left;  CDE: 2.74   CHOLECYSTECTOMY     CYSTOSCOPY     stone   DIAGNOSTIC LAPAROSCOPY     laparoscopic cholecystectomy   DOPPLER ECHOCARDIOGRAPHY  2009  ESOPHAGOGASTRODUODENOSCOPY N/A 10/31/2020   Procedure: ESOPHAGOGASTRODUODENOSCOPY (EGD);  Surgeon: Tanda Locus, MD;  Location: [facility];  Service: General;  Laterality: N/A;   EYE SURGERY Bilateral 08/14/2017   cataract removal   FINGER ARTHROPLASTY Left 11/27/2023   Procedure: ARTHROPLASTY, FINGER;  Surgeon: Arlinda Buster, MD;  Location: [facility];  Service: Orthopedics;  Laterality: Left;  LEFT THUMB CARPOMATACARPAL JOINT ARTHROPLASTY WITH INTERNAL BRACE AND METACARPOPHALANGEAL JOINT CAPSULODESIS PINNING   GLUTEUS MINIMUS REPAIR Left 11/04/2022   Procedure: LEFT GLUTEUS MEDIUS REPAIR;  Surgeon: Genelle Standing, MD;  Location: [facility];  Service: Orthopedics;  Laterality: Left;   head up tilt table testing  06/15/2007   Danelle Birmingham   HEMORRHOID SURGERY     HERNIA REPAIR     insertion of implatable loop recorder  08/11/2007   Danelle Birmingham   POSTERIOR CERVICAL FUSION/FORAMINOTOMY N/A 12/19/2013    Procedure: RIGHT C3-4.C4-5 AND C5-6 FORAMINOTOMIES;  Surgeon: Lynwood FORBES Better, MD;  Location: [facility] OR;  Service: Orthopedics;  Laterality: N/A;   TOTAL HIP ARTHROPLASTY Left 01/31/2020   Procedure: LEFT TOTAL HIP ARTHROPLASTY ANTERIOR APPROACH;  Surgeon: Vernetta Lonni GRADE, MD;  Location: [facility];  Service: Orthopedics;  Laterality: Left;   TOTAL SHOULDER ARTHROPLASTY Right 11/15/2019   Procedure: RIGHT TOTAL SHOULDER ARTHROPLASTY;  Surgeon: Addie Cordella Hamilton, MD;  Location: [facility];  Service: Orthopedics;  Laterality: Right;   TOTAL SHOULDER REVISION Right 06/11/2021   Procedure: RIGHT SHOULDER CONVERSION TOTAL SHOULDER ARTHROPLASTY to REVERSE TOTAL SHOULDER ARTHROPLASTY;  Surgeon: Addie Cordella Hamilton, MD;  Location: [facility] OR;  Service: Orthopedics;  Laterality: Right;   TUBAL LIGATION     UPPER GASTROINTESTINAL ENDOSCOPY     XI ROBOTIC ASSISTED HIATAL HERNIA REPAIR N/A 10/31/2020   Procedure: XI ROBOTIC ASSISTED HIATAL HERNIA REPAIR WITH PARTIAL FUNDOPLICATION;  Surgeon: Tanda Locus, MD;  Location: [facility];  Service: General;  Laterality: N/A;   Patient Active Problem List   Diagnosis Date Noted   Arthritis of carpometacarpal [facility]) joint of left thumb 11/27/2023   Gonorrhea 12/03/2022   Acute cystitis with hematuria 11/26/2022   Tear of left gluteus minimus tendon 11/04/2022   Instability of prosthetic shoulder joint    S/P reverse total shoulder arthroplasty, right 06/11/2021   Hypokalemia 11/25/2020   S/P laparoscopic fundoplication 10/31/2020   Status post total replacement of left hip 01/31/2020   Unilateral primary osteoarthritis, left hip 12/21/2019   S/P shoulder replacement, right 11/15/2019   Diverticulosis 04/14/2019   Chronic diastolic CHF (congestive heart failure) (HCC) 04/14/2019   Unstable angina (HCC) 10/20/2017   Chronic pain 09/23/2016   Pre-diabetes 09/02/2016   Recurrent major depressive disorder, in partial remission 07/14/2016   Seizures (HCC) 07/14/2016   GAD  (generalized anxiety disorder) 07/14/2016   Insomnia 09/05/2014   Allergic rhinitis 09/05/2014   Cervical spondylosis without myelopathy 12/19/2013    Class: Chronic   Neural foraminal stenosis of cervical spine 12/19/2013   Cervical radiculitis 09/19/2013   Lumbosacral spondylosis without myelopathy 11/16/2012   Postlaminectomy syndrome, lumbar region 11/16/2012   Herpes simplex virus (HSV) infection 10/31/2008   Hyperlipidemia with target LDL less than 100 10/31/2008   Primary hypertension 10/31/2008   GERD 10/31/2008   SPINAL STENOSIS OF LUMBAR REGION 10/31/2008   Osteoporosis 10/31/2008   SPONDYLOLISTHESIS 10/31/2008   SYNCOPE 10/31/2008   Sleep apnea 10/31/2008    ONSET DATE: 11/27/23  REFERRING DIAG: M18.12 (ICD-10-CM) - Primary osteoarthritis of first carpometacarpal joint of left hand    THERAPY DIAG:  Pain in left  hand  Stiffness of left hand, not elsewhere classified  Muscle weakness (generalized)  Localized edema  Other lack of coordination  Rationale for Evaluation and Treatment: Rehabilitation  SUBJECTIVE:   SUBJECTIVE STATEMENT: Pt reports no pain today as well as no swelling.   Pt accompanied by: self  PERTINENT HISTORY:  Pt with L thumb carpometacarpal arthritis with associated MCP hyperextension. pt underwent the following procedures on 11/27/23:  Left thumb carpometacarpal arthroplasty Left tendon transfer abductor pollicis longus to flexor carpi radialis Left partial trapezoid ectomy Left de Quervain release surgery Left thumb MCP capsulodesis and pinning  PMHx: Reverse tSA,anxiety, arthritis , bipolar CHF, COPD, CAD depression, DM fribromyalgia, head injury, MI , seizure ,back surg, L THA   Pt went to MD on 12/30/23 Plan: Pin removed today without incident.  Continue with removable splint fabricated by OT.  Continue with range of motion exercises for 4 weeks.  Follow-up at that juncture for recheck and likely progression of strengthening.    PRECAUTIONS: Other: splint at all times, wait to begin ROM after pin removed , hx of seizures  RED FLAGS: None   WEIGHT BEARING RESTRICTIONS: Yes NWB to LUE  PAIN: Are you having pain? No   FALLS: Has patient fallen in last 6 months? Yes. Number of falls 2 since surgery; fell this weekend - moving a laundry basket and fell - didn't have her splint on.  LIVING ENVIRONMENT: Lives with: lives with their family   PLOF: Independent  PATIENT GOALS: improve functional use and pain in LUE  NEXT MD VISIT: 02/01/24  OBJECTIVE:  Note: Objective measures were completed at Evaluation unless otherwise noted.  HAND DOMINANCE: Right  ADLs:Pt is currently using her RUE for ADLs   FUNCTIONAL OUTCOME MEASURES: Quick Dash: 93% disability  UPPER EXTREMITY ROM:   NT due to precautions  Active ROM Right eval Left eval  Shoulder flexion    Shoulder abduction    Shoulder adduction    Shoulder extension    Shoulder internal rotation    Shoulder external rotation    Elbow flexion    Elbow extension    Wrist flexion    Wrist extension    Wrist ulnar deviation    Wrist radial deviation    Wrist pronation    Wrist supination    (Blank rows = not tested)  Active ROM Right eval Left eval  Thumb MCP (0-60)    Thumb IP (0-80)    Thumb Radial abd/add (0-55)     Thumb Palmar abd/add (0-45)     Thumb Opposition to Small Finger     Index MCP (0-90)     Index PIP (0-100)     Index DIP (0-70)      Long MCP (0-90)      Long PIP (0-100)      Long DIP (0-70)      Ring MCP (0-90)      Ring PIP (0-100)      Ring DIP (0-70)      Little MCP (0-90)      Little PIP (0-100)      Little DIP (0-70)      (Blank rows = not tested)  HAND FUNCTION: 01/21/24- Grip strength: Right:33.9,42.5, 46.7 lbs  Left: 15.4, 20.7, 23.1 lbs Average-  41 lbs ( R);  19.7 lbs (L)   COORDINATION:  01/21/24 9-hole peg test: Right 26 seconds ;  Left 31 seconds  SENSATION: Not tested  EDEMA:  moderate  COGNITION: Overall cognitive status: Within functional  limits for tasks assessed   OBSERVATIONS: Pleasant female arrived with hand wrapped in temporary  thumb spica cast . Moderate edema at dorsal hand when cast was removed pt reports cast was too tight and rubbing her.    TREATMENT: - Therapeutic exercises completed for duration as noted below including: Pt placed LUE in Fluidotherapy machine with supervised ROM x 10 min. Pt was educated to complete AROM during modality time to improve thumb motions and decrease pain/stiffness of affected extremity by use of the machine's massaging action and thermal properties.   OT initiated ROM and strengthening exercises to promote continued mobilization and improved ROM of the left thumb for increased functional use in daily activities.Exercises included the following;  - Thumb Abduction AROM on Table  - Seated Thumb Extension  - Thumb Strengthening Stabilization CMC    - Therapeutic activities completed for duration as noted below including: Pt participated in Chain game using BUE for eye-hand coordination, pinch strength, and cognition for planning and problem solving.  Game required pt to stretch elastics over 3 pegs to create triangles to 'capture' spaces by placing a game piece inside the completed triangle/s. Pt able to problem solve to create triangles multiple time with min cues to begin and min physical difficulty with coordination of L UE to stretch elastics.  Pt was able to perform in hand manipulation to manage game pieces and followed directions with verbal to introduce new game/rules. Pt able to play game during social conversation without difficulty.    Patient also inquired about when she would be physically able to play her guitar at home. OT simulated the task by placing yellow putty on a wooden dowel, encouraging the patient to hold the dowel and press down on the putty as if strumming her guitar, to assess for any pain or  discomfort in thumb. The patient reported slight difficulty with certain movements requiring further stretch of digits to reach a fret position on the handle.  PATIENT EDUCATION: Education details: Thumb ROM and strengthening exercises Person educated: Patient Education method: Explanation, Demonstration, Tactile cues, Verbal cues, and Handouts Education comprehension: verbalized understanding, returned demonstration, verbal cues required, tactile cues required, and needs further education  HOME EXERCISE PROGRAM: 01/05/24: Tendon glide, thumb opposition, wrist flex/ext and scar massage 01/21/24: Joint protection, putty exercises (yellow), isometric exercises 01/25/24: Coordination activities 01/28/24: Thumb ROM and strengthening exercises- access code; [email or web address]  GOALS: Goals reviewed with patient? Yes  SHORT TERM GOALS: Target date: 01/14/24  I with splint wear, care and precautions. Goal status: MET  2.  I with inital HEP. Goal status: MET  3.  I with edema control techniques. Goal status: MET  4.  Pt will increase thumb MP flexion by 10* from inital measurement for increased functional use Baseline: NT due to prec. Goal status: IN Progress  5.  Pt will increase thumb IP flexion by 10* from inital measurement Baseline: NT due to prec. Goal status: IN Progress    LONG TERM GOALS: Target date: 03/09/24  I with updated HEP. Goal status: IN Progress  2.  Pt will demonstrate improved LUE functional use as evidenced by improving Quick DASH score to 75% disability or better. Baseline: 93% disbaility Goal status: IN Progress  3.  Pt will demonstrate grip strength of 25# or greater for increased functional use. 01/21/24: Average-  41 lbs ( R);  19.7 lbs (L) Goal status: IN prorgress  4.  Pt will resume use of LUE as a non dominant assist for ADLs with  pain no greater than 3/10. Goal status: IN Progress  5.  Pt will demonstrate ability to oppose 5th digit with thumb for  increased functional use of LUE. Baseline: NT Goal status: IN Progress  ASSESSMENT:  CLINICAL IMPRESSION: Pt presents with L hand weakness and incoordination s/p multiple procedures for OA L thumb ie) Left thumb carpometacarpal arthroplasty, Left tendon transfer abductor pollicis longus to flexor carpi radialis, Left partial trapezoidectomy, Left de Quervain release surgery, Left thumb MCP capsulodesis and pinning. She demonstrates good participation in OT activities to improve ROM, coordination and functional use of L thumb with verbal understanding of her HEP and joint protection but tendency still to overdo it at home. She will continue to benefit from skilled outpatient OT to improve functional use of L UE for ADLs and IADLs.   PERFORMANCE DEFICITS: in functional skills including ADLs, IADLs, coordination, dexterity, sensation, edema, ROM, strength, pain, flexibility, Fine motor control, Gross motor control, decreased knowledge of precautions, wound, skin integrity, and UE functional use, and psychosocial skills including coping strategies, environmental adaptation, habits, interpersonal interactions, and routines and behaviors.   IMPAIRMENTS: are limiting patient from ADLs, IADLs, rest and sleep, education, play, leisure, and social participation.   COMORBIDITIES: may have co-morbidities  that affects occupational performance. Patient will benefit from skilled OT to address above impairments and improve overall function.  REHAB POTENTIAL: Good  PLAN:  OT FREQUENCY: 2x/week  OT DURATION: 12 weeks  PLANNED INTERVENTIONS: 97168 OT Re-evaluation, 97535 self care/ADL training, 02889 therapeutic exercise, 97530 therapeutic activity, 97112 neuromuscular re-education, 97140 manual therapy, 97035 ultrasound, 97018 paraffin, 02989 moist heat, 97010 cryotherapy, 97014 electrical stimulation unattended, 97750 Physical Performance Testing, 02239 Orthotic Initial, 97763 Orthotic/Prosthetic subsequent,  passive range of motion, energy conservation, coping strategies training, patient/family education, and DME and/or AE instructions  RECOMMENDED OTHER SERVICES: n/a  CONSULTED AND AGREED WITH PLAN OF CARE: Patient  PLAN FOR NEXT SESSION:  splint check and modifications PRN Progress HEP and strength per protocols - pg 37 Modalities as needed for pain and stiffness Guitar simulation  Dresden Lozito, Student-OT 01/28/2024, 11:40 AM

## 2024-02-01 ENCOUNTER — Encounter: Payer: Self-pay | Admitting: Radiology

## 2024-02-01 ENCOUNTER — Ambulatory Visit: Attending: Orthopedic Surgery | Admitting: Occupational Therapy

## 2024-02-01 ENCOUNTER — Ambulatory Visit: Admitting: Orthopedic Surgery

## 2024-02-01 DIAGNOSIS — R600 Localized edema: Secondary | ICD-10-CM | POA: Diagnosis present

## 2024-02-01 DIAGNOSIS — M25642 Stiffness of left hand, not elsewhere classified: Secondary | ICD-10-CM | POA: Diagnosis present

## 2024-02-01 DIAGNOSIS — M6281 Muscle weakness (generalized): Secondary | ICD-10-CM | POA: Diagnosis present

## 2024-02-01 DIAGNOSIS — M79642 Pain in left hand: Secondary | ICD-10-CM | POA: Diagnosis present

## 2024-02-01 DIAGNOSIS — R278 Other lack of coordination: Secondary | ICD-10-CM | POA: Insufficient documentation

## 2024-02-01 DIAGNOSIS — M1812 Unilateral primary osteoarthritis of first carpometacarpal joint, left hand: Secondary | ICD-10-CM

## 2024-02-01 NOTE — Progress Notes (Unsigned)
   DOREATHER [name] - 68 y.o. female MRN [id number]  Date of birth: Apr 22, 1955  Office Visit Note: Visit Date: 02/01/2024 PCP: Gladis Mustard, FNP Referred by: Gladis, [name], *  Subjective:  HPI: [name] is a 68 y.o. female who presents today for follow up 9 weeks status post Left thumb carpometacarpal arthritis with associated MCP hyperextension .  Doing very well overall, very pleased with her progress so far  Pertinent ROS were reviewed with the patient and found to be negative unless otherwise specified above in HPI.   Assessment & Plan: Visit Diagnoses:  No diagnosis found.    Plan: She is doing very well postoperatively.  Continue with occupational therapy regimen for strengthening.  Can resume activities as tolerated over the upcoming 3 to 4 weeks.  Can wean away from the brace as tolerated as well.  Follow-up myself in approxi-1 month  Follow-up: No follow-ups on file.   Meds & Orders:  No orders of the defined types were placed in this encounter.   No orders of the defined types were placed in this encounter.    Procedures: No procedures performed       Objective:   Vital Signs: There were no vitals taken for this visit.  Ortho Exam Left wrist: - Well-healed incision at the glabrous/nonglabrous juncture over the [facility] region of the thumb - Thumb circumduction without significant pain or crepitus, thumb opposition to the ring finger DPC - Hand is warm well-perfused, sensation intact in all distributions including DRSN   Imaging: No results found.    Anshul Afton Alderton, M.D. [facility], Hand Surgery

## 2024-02-01 NOTE — Therapy (Addendum)
 OUTPATIENT OCCUPATIONAL THERAPY ORTHO TREATMENT  Patient Name: [name] MRN: [id number] DOB:01/27/1956, 68 y.o., female Today's Date: 02/01/2024  PCP: Gladis Annita Quant FNP REFERRING PROVIDER: Dr. Arlinda  END OF SESSION:  OT End of Session - 02/01/24 1443     Visit Number 7    Number of Visits 25    Date for Recertification  03/09/24    Authorization Type 12 weeks    Progress Note Due on Visit 10    OT Start Time 1446    OT Stop Time 1531    OT Time Calculation (min) 45 min    Equipment Utilized During Treatment Fluido    Activity Tolerance Patient tolerated treatment well    Behavior During Therapy WFL for tasks assessed/performed          Past Medical History:  Diagnosis Date   Allergy    Anemia    Anginal pain    last time    Anxiety    Arthritis    RHEUMATOID   Asthma    Bipolar 1 disorder (HCC)    Blood transfusion without reported diagnosis    x 3   Cataracts, bilateral 07/2017   CHF (congestive heart failure) (HCC)    COPD (chronic obstructive pulmonary disease) (HCC)    Coronary artery disease    reported hx of MI;  Echo 2009 with normal LVF;  Myoview  05/2011: no ischemia   Depression    Diabetes mellitus without complication (HCC)    Dyslipidemia    Dysrhythmia    SVT   Elevated liver enzymes 06/30/2022   Esophageal stricture    Fibromyalgia    GERD (gastroesophageal reflux disease)    H/O hiatal hernia    Head injury, unspecified    Headache    migraines   Herpes simplex infection    History of kidney stones    History of loop recorder 08/10/2017   Managed by Dr. Cathlyn Birmingham   Hyperlipidemia    Hypertension    Insomnia    Myocardial infarction [facility])    age 77   Osteoporosis    Pneumonia    hx   Seizures (HCC)    none in the last 3-4 years as of 11/03/22 per patient   Shortness of breath    Sleep apnea    mild, does not require positive pressure treatment. (02/03/22)   Spinal stenosis of lumbar region     Spondylolisthesis    Status post placement of implantable loop recorder    Supraventricular tachycardia    Syncope and collapse    s/p ILR; no arhythmogenic cause identified   UTI (lower urinary tract infection)    Past Surgical History:  Procedure Laterality Date   BACK SURGERY     BREAST EXCISIONAL BIOPSY Right    1990s   BREAST SURGERY     lumpectomy   CARDIAC CATHETERIZATION  10/06/2011   CATARACT EXTRACTION W/PHACO Right 07/31/2017   Procedure: CATARACT EXTRACTION PHACO AND INTRAOCULAR LENS PLACEMENT (IOC);  Surgeon: Harrie Agent, MD;  Location: [facility];  Service: Ophthalmology;  Laterality: Right;  CDE: 2.33   CATARACT EXTRACTION W/PHACO Left 08/14/2017   Procedure: CATARACT EXTRACTION PHACO AND INTRAOCULAR LENS PLACEMENT (IOC);  Surgeon: Harrie Agent, MD;  Location: [facility];  Service: Ophthalmology;  Laterality: Left;  CDE: 2.74   CHOLECYSTECTOMY     CYSTOSCOPY     stone   DIAGNOSTIC LAPAROSCOPY     laparoscopic cholecystectomy   DOPPLER ECHOCARDIOGRAPHY  2009   ESOPHAGOGASTRODUODENOSCOPY N/A  10/31/2020   Procedure: ESOPHAGOGASTRODUODENOSCOPY (EGD);  Surgeon: Tanda Locus, MD;  Location: [facility];  Service: General;  Laterality: N/A;   EYE SURGERY Bilateral 08/14/2017   cataract removal   FINGER ARTHROPLASTY Left 11/27/2023   Procedure: ARTHROPLASTY, FINGER;  Surgeon: Arlinda Buster, MD;  Location: [facility];  Service: Orthopedics;  Laterality: Left;  LEFT THUMB CARPOMATACARPAL JOINT ARTHROPLASTY WITH INTERNAL BRACE AND METACARPOPHALANGEAL JOINT CAPSULODESIS PINNING   GLUTEUS MINIMUS REPAIR Left 11/04/2022   Procedure: LEFT GLUTEUS MEDIUS REPAIR;  Surgeon: Genelle Standing, MD;  Location: [facility];  Service: Orthopedics;  Laterality: Left;   head up tilt table testing  06/15/2007   Danelle Birmingham   HEMORRHOID SURGERY     HERNIA REPAIR     insertion of implatable loop recorder  08/11/2007   Danelle Birmingham   POSTERIOR CERVICAL FUSION/FORAMINOTOMY N/A 12/19/2013    Procedure: RIGHT C3-4.C4-5 AND C5-6 FORAMINOTOMIES;  Surgeon: Lynwood FORBES Better, MD;  Location: [facility] OR;  Service: Orthopedics;  Laterality: N/A;   TOTAL HIP ARTHROPLASTY Left 01/31/2020   Procedure: LEFT TOTAL HIP ARTHROPLASTY ANTERIOR APPROACH;  Surgeon: Vernetta Lonni GRADE, MD;  Location: [facility];  Service: Orthopedics;  Laterality: Left;   TOTAL SHOULDER ARTHROPLASTY Right 11/15/2019   Procedure: RIGHT TOTAL SHOULDER ARTHROPLASTY;  Surgeon: Addie Cordella Hamilton, MD;  Location: [facility];  Service: Orthopedics;  Laterality: Right;   TOTAL SHOULDER REVISION Right 06/11/2021   Procedure: RIGHT SHOULDER CONVERSION TOTAL SHOULDER ARTHROPLASTY to REVERSE TOTAL SHOULDER ARTHROPLASTY;  Surgeon: Addie Cordella Hamilton, MD;  Location: [facility] OR;  Service: Orthopedics;  Laterality: Right;   TUBAL LIGATION     UPPER GASTROINTESTINAL ENDOSCOPY     XI ROBOTIC ASSISTED HIATAL HERNIA REPAIR N/A 10/31/2020   Procedure: XI ROBOTIC ASSISTED HIATAL HERNIA REPAIR WITH PARTIAL FUNDOPLICATION;  Surgeon: Tanda Locus, MD;  Location: [facility];  Service: General;  Laterality: N/A;   Patient Active Problem List   Diagnosis Date Noted   Arthritis of carpometacarpal [facility]) joint of left thumb 11/27/2023   Gonorrhea 12/03/2022   Acute cystitis with hematuria 11/26/2022   Tear of left gluteus minimus tendon 11/04/2022   Instability of prosthetic shoulder joint    S/P reverse total shoulder arthroplasty, right 06/11/2021   Hypokalemia 11/25/2020   S/P laparoscopic fundoplication 10/31/2020   Status post total replacement of left hip 01/31/2020   Unilateral primary osteoarthritis, left hip 12/21/2019   S/P shoulder replacement, right 11/15/2019   Diverticulosis 04/14/2019   Chronic diastolic CHF (congestive heart failure) (HCC) 04/14/2019   Unstable angina (HCC) 10/20/2017   Chronic pain 09/23/2016   Pre-diabetes 09/02/2016   Recurrent major depressive disorder, in partial remission 07/14/2016   Seizures (HCC) 07/14/2016   GAD  (generalized anxiety disorder) 07/14/2016   Insomnia 09/05/2014   Allergic rhinitis 09/05/2014   Cervical spondylosis without myelopathy 12/19/2013    Class: Chronic   Neural foraminal stenosis of cervical spine 12/19/2013   Cervical radiculitis 09/19/2013   Lumbosacral spondylosis without myelopathy 11/16/2012   Postlaminectomy syndrome, lumbar region 11/16/2012   Herpes simplex virus (HSV) infection 10/31/2008   Hyperlipidemia with target LDL less than 100 10/31/2008   Primary hypertension 10/31/2008   GERD 10/31/2008   SPINAL STENOSIS OF LUMBAR REGION 10/31/2008   Osteoporosis 10/31/2008   SPONDYLOLISTHESIS 10/31/2008   SYNCOPE 10/31/2008   Sleep apnea 10/31/2008    ONSET DATE: 11/27/23  REFERRING DIAG: M18.12 (ICD-10-CM) - Primary osteoarthritis of first carpometacarpal joint of left hand    THERAPY DIAG:  Pain in left hand  Stiffness of left hand, not elsewhere classified  Muscle weakness (generalized)  Localized edema  Other lack of coordination  Rationale for Evaluation and Treatment: Rehabilitation  SUBJECTIVE:   SUBJECTIVE STATEMENT: Patient reports that her doctor cleared her to engage in activities as normal, with no brace required and no restrictions, as long as there is no pain. She was advised to continue wearing the brace when lifting heavy objects or performing repetitive hand movements. Pt verbalized understanding. Today, the patient reports no pain but still demonstrates notable swelling in the L hand.   Pt accompanied by: self  PERTINENT HISTORY:  Pt with L thumb carpometacarpal arthritis with associated MCP hyperextension. pt underwent the following procedures on 11/27/23:  Left thumb carpometacarpal arthroplasty Left tendon transfer abductor pollicis longus to flexor carpi radialis Left partial trapezoid ectomy Left de Quervain release surgery Left thumb MCP capsulodesis and pinning  PMHx: Reverse tSA,anxiety, arthritis , bipolar CHF, COPD, CAD  depression, DM fibromyalgia, head injury, MI, seizure,b ack surg, L THA   Pt went to MD on 12/30/23 Plan: Pin removed today without incident.  Continue with removable splint fabricated by OT.  Continue with range of motion exercises for 4 weeks.  Follow-up at that juncture for recheck and likely progression of strengthening.   PRECAUTIONS: Other: splint at all times, wait to begin ROM after pin removed , hx of seizures  RED FLAGS: None   WEIGHT BEARING RESTRICTIONS: Yes NWB to LUE  PAIN: Are you having pain? No   FALLS: Has patient fallen in last 6 months? Yes. Number of falls 2 since surgery; fell this weekend - moving a laundry basket and fell - didn't have her splint on.  LIVING ENVIRONMENT: Lives with: lives with their family   PLOF: Independent  PATIENT GOALS: improve functional use and pain in LUE  NEXT MD VISIT: 02/29/2024  OBJECTIVE:  Note: Objective measures were completed at Evaluation unless otherwise noted.  HAND DOMINANCE: Right  ADLs:Pt is currently using her RUE for ADLs   FUNCTIONAL OUTCOME MEASURES: Quick Dash: 93% disability  UPPER EXTREMITY ROM:   NT due to precautions  Active ROM Right eval Left eval  Shoulder flexion    Shoulder abduction    Shoulder adduction    Shoulder extension    Shoulder internal rotation    Shoulder external rotation    Elbow flexion    Elbow extension    Wrist flexion    Wrist extension    Wrist ulnar deviation    Wrist radial deviation    Wrist pronation    Wrist supination    (Blank rows = not tested)  Active ROM Right eval Left eval  Thumb MCP (0-60)    Thumb IP (0-80)    Thumb Radial abd/add (0-55)     Thumb Palmar abd/add (0-45)     Thumb Opposition to Small Finger     Index MCP (0-90)     Index PIP (0-100)     Index DIP (0-70)      Long MCP (0-90)      Long PIP (0-100)      Long DIP (0-70)      Ring MCP (0-90)      Ring PIP (0-100)      Ring DIP (0-70)      Little MCP (0-90)      Little PIP  (0-100)      Little DIP (0-70)      (Blank rows = not tested)  HAND FUNCTION: 01/21/24- Grip strength:  Right:33.9,42.5, 46.7 lbs  Left: 15.4, 20.7, 23.1 lbs Average-  41 lbs ( R);  19.7 lbs (L)   COORDINATION:  01/21/24 9-hole peg test: Right 26 seconds ;  Left 31 seconds  SENSATION: Not tested  EDEMA: moderate  COGNITION: Overall cognitive status: Within functional limits for tasks assessed   OBSERVATIONS: Pleasant female arrived with hand wrapped in temporary  thumb spica cast . Moderate edema at dorsal hand when cast was removed pt reports cast was too tight and rubbing her.    TREATMENT: - Therapeutic exercises completed for duration as noted below including: Pt still has some swelling on L UE.  Therefore, pt placed LUE in Fluidotherapy machine with supervised ROM x 10 min. Pt was educated to complete AROM during modality time to improve thumb motions and decrease pain/stiffness of affected extremity by use of the machine's massaging action and thermal properties. Pt reported increased thumb opposition after fluido to be able to touch near the base of her pinkie finger.   - Self-care/home management completed for duration as noted below including:  Pt reported continued slight difficulty with buttoning and discomfort in the left hand when manipulating buttons. The patient was encouraged to practice the task by completing as many buttons as possible on a long-sleeve shirt. It was noted that the patient has difficulty with pinching and must use a tight pinch with the left hand to manipulate buttons.  OT provided education on strategies to facilitate dressing with buttons. An elastic band was tied through the buttonhole, allowing the patient to loop it around the button using the stretch of the elastic rather than requiring a tight grasp of the fabric ie) particularly for blue jean button. The patient verbalized understanding of this strategy.  - Therapeutic activities completed for  duration as noted below including:  In addition, pt engaged in a fine motor coordination activity to promote strengthening of her L UE. Pt was encouraged to place and removal of yellow, red, green, blue, and black resistive clothespins with use of left 3 point or lateral pinch for strengthening of affected extremity. There were no reports of pain or discomfort x 2 repetitions with all clothespins x30+.    PATIENT EDUCATION: Education details: Buttoning compensatory strategies Person educated: Patient Education method: Explanation, Demonstration, Tactile cues, and Verbal cues Education comprehension: verbalized understanding, returned demonstration, verbal cues required, tactile cues required, and needs further education  HOME EXERCISE PROGRAM: 01/05/24: Tendon glide, thumb opposition, wrist flex/ext and scar massage 01/21/24: Joint protection, putty exercises (yellow), isometric exercises 01/25/24: Coordination activities 01/28/24: Thumb ROM and strengthening exercises- access code; [email or web address]  GOALS: Goals reviewed with patient? Yes  SHORT TERM GOALS: Target date: 01/14/24  I with splint wear, care and precautions. Goal status: MET  2.  I with inital HEP. Goal status: MET  3.  I with edema control techniques. Goal status: MET  4.  Pt will increase thumb MP flexion by 10* from inital measurement for increased functional use Baseline: NT due to prec. Goal status: IN Progress  5.  Pt will increase thumb IP flexion by 10* from inital measurement Baseline: NT due to prec. Goal status: IN Progress    LONG TERM GOALS: Target date: 03/09/24  I with updated HEP. Goal status: IN Progress  2.  Pt will demonstrate improved LUE functional use as evidenced by improving Quick DASH score to 75% disability or better. Baseline: 93% disbaility Goal status: IN Progress  3.  Pt will demonstrate grip strength of 25# or greater  for increased functional use. 01/21/24: Average-  41 lbs ( R);   19.7 lbs (L) Goal status: IN prorgress  4.  Pt will resume use of LUE as a non dominant assist for ADLs with pain no greater than 3/10. Goal status: IN Progress  5.  Pt will demonstrate ability to oppose 5th digit with thumb for increased functional use of LUE. Baseline: NT Goal status: IN Progress  ASSESSMENT:  CLINICAL IMPRESSION: Pt presents with L hand weakness and incoordination s/p multiple procedures for OA L thumb ie) Left thumb carpometacarpal arthroplasty, Left tendon transfer abductor pollicis longus to flexor carpi radialis, Left partial trapezoidectomy, Left de Quervain release surgery, Left thumb MCP capsulodesis and pinning. She demonstrates good participation in OT activities to improve ROM, coordination and functional use of L thumb with verbal understanding of her HEP and joint protection but tendency still to overdo it at home. She will continue to benefit from skilled outpatient OT to improve functional use of L UE for ADLs and IADLs.   PERFORMANCE DEFICITS: in functional skills including ADLs, IADLs, coordination, dexterity, sensation, edema, ROM, strength, pain, flexibility, Fine motor control, Gross motor control, decreased knowledge of precautions, wound, skin integrity, and UE functional use, and psychosocial skills including coping strategies, environmental adaptation, habits, interpersonal interactions, and routines and behaviors.   IMPAIRMENTS: are limiting patient from ADLs, IADLs, rest and sleep, education, play, leisure, and social participation.   COMORBIDITIES: may have co-morbidities  that affects occupational performance. Patient will benefit from skilled OT to address above impairments and improve overall function.  REHAB POTENTIAL: Good  PLAN:  OT FREQUENCY: 2x/week  OT DURATION: 12 weeks  PLANNED INTERVENTIONS: 97168 OT Re-evaluation, 97535 self care/ADL training, 02889 therapeutic exercise, 97530 therapeutic activity, 97112 neuromuscular  re-education, 97140 manual therapy, 97035 ultrasound, 97018 paraffin, 02989 moist heat, 97010 cryotherapy, 97014 electrical stimulation unattended, 97750 Physical Performance Testing, 02239 Orthotic Initial, 97763 Orthotic/Prosthetic subsequent, passive range of motion, energy conservation, coping strategies training, patient/family education, and DME and/or AE instructions  RECOMMENDED OTHER SERVICES: n/a  CONSULTED AND AGREED WITH PLAN OF CARE: Patient  PLAN FOR NEXT SESSION:  Measure MP/IP flexion Light strengthening activities - retest grip strength Modalities as needed for pain and stiffness Guitar simulation  Leean Amezcua, Student-OT 02/01/2024, 3:44 PM

## 2024-02-04 ENCOUNTER — Ambulatory Visit: Admitting: Occupational Therapy

## 2024-02-04 DIAGNOSIS — M25642 Stiffness of left hand, not elsewhere classified: Secondary | ICD-10-CM

## 2024-02-04 DIAGNOSIS — M79642 Pain in left hand: Secondary | ICD-10-CM | POA: Diagnosis not present

## 2024-02-04 DIAGNOSIS — M6281 Muscle weakness (generalized): Secondary | ICD-10-CM

## 2024-02-04 DIAGNOSIS — R278 Other lack of coordination: Secondary | ICD-10-CM

## 2024-02-04 DIAGNOSIS — R600 Localized edema: Secondary | ICD-10-CM

## 2024-02-04 NOTE — Therapy (Addendum)
 OUTPATIENT OCCUPATIONAL THERAPY ORTHO TREATMENT  Patient Name: [name] MRN: [id number] DOB:1955/06/05, 68 y.o., female Today's Date: 02/04/2024  PCP: Gladis Annita Quant FNP REFERRING PROVIDER: Dr. Arlinda  END OF SESSION:  OT End of Session - 02/04/24 0934     Visit Number 8    Number of Visits 25    Date for Recertification  03/09/24    Authorization Type 12 weeks    Progress Note Due on Visit 10    OT Start Time 0933    OT Stop Time 1015    OT Time Calculation (min) 42 min    Equipment Utilized During Treatment testing material, UBE, tie blanket    Activity Tolerance Patient tolerated treatment well    Behavior During Therapy WFL for tasks assessed/performed          Past Medical History:  Diagnosis Date   Allergy    Anemia    Anginal pain    last time    Anxiety    Arthritis    RHEUMATOID   Asthma    Bipolar 1 disorder (HCC)    Blood transfusion without reported diagnosis    x 3   Cataracts, bilateral 07/2017   CHF (congestive heart failure) (HCC)    COPD (chronic obstructive pulmonary disease) (HCC)    Coronary artery disease    reported hx of MI;  Echo 2009 with normal LVF;  Myoview  05/2011: no ischemia   Depression    Diabetes mellitus without complication (HCC)    Dyslipidemia    Dysrhythmia    SVT   Elevated liver enzymes 06/30/2022   Esophageal stricture    Fibromyalgia    GERD (gastroesophageal reflux disease)    H/O hiatal hernia    Head injury, unspecified    Headache    migraines   Herpes simplex infection    History of kidney stones    History of loop recorder 08/10/2017   Managed by Dr. Cathlyn Birmingham   Hyperlipidemia    Hypertension    Insomnia    Myocardial infarction [facility])    age 73   Osteoporosis    Pneumonia    hx   Seizures (HCC)    none in the last 3-4 years as of 11/03/22 per patient   Shortness of breath    Sleep apnea    mild, does not require positive pressure treatment. (02/03/22)   Spinal stenosis of  lumbar region    Spondylolisthesis    Status post placement of implantable loop recorder    Supraventricular tachycardia    Syncope and collapse    s/p ILR; no arhythmogenic cause identified   UTI (lower urinary tract infection)    Past Surgical History:  Procedure Laterality Date   BACK SURGERY     BREAST EXCISIONAL BIOPSY Right    1990s   BREAST SURGERY     lumpectomy   CARDIAC CATHETERIZATION  10/06/2011   CATARACT EXTRACTION W/PHACO Right 07/31/2017   Procedure: CATARACT EXTRACTION PHACO AND INTRAOCULAR LENS PLACEMENT (IOC);  Surgeon: Harrie Agent, MD;  Location: [facility];  Service: Ophthalmology;  Laterality: Right;  CDE: 2.33   CATARACT EXTRACTION W/PHACO Left 08/14/2017   Procedure: CATARACT EXTRACTION PHACO AND INTRAOCULAR LENS PLACEMENT (IOC);  Surgeon: Harrie Agent, MD;  Location: [facility];  Service: Ophthalmology;  Laterality: Left;  CDE: 2.74   CHOLECYSTECTOMY     CYSTOSCOPY     stone   DIAGNOSTIC LAPAROSCOPY     laparoscopic cholecystectomy   DOPPLER ECHOCARDIOGRAPHY  2009  ESOPHAGOGASTRODUODENOSCOPY N/A 10/31/2020   Procedure: ESOPHAGOGASTRODUODENOSCOPY (EGD);  Surgeon: Tanda Locus, MD;  Location: [facility];  Service: General;  Laterality: N/A;   EYE SURGERY Bilateral 08/14/2017   cataract removal   FINGER ARTHROPLASTY Left 11/27/2023   Procedure: ARTHROPLASTY, FINGER;  Surgeon: Arlinda Buster, MD;  Location: [facility];  Service: Orthopedics;  Laterality: Left;  LEFT THUMB CARPOMATACARPAL JOINT ARTHROPLASTY WITH INTERNAL BRACE AND METACARPOPHALANGEAL JOINT CAPSULODESIS PINNING   GLUTEUS MINIMUS REPAIR Left 11/04/2022   Procedure: LEFT GLUTEUS MEDIUS REPAIR;  Surgeon: Genelle Standing, MD;  Location: [facility];  Service: Orthopedics;  Laterality: Left;   head up tilt table testing  06/15/2007   Danelle Birmingham   HEMORRHOID SURGERY     HERNIA REPAIR     insertion of implatable loop recorder  08/11/2007   Danelle Birmingham   POSTERIOR CERVICAL FUSION/FORAMINOTOMY  N/A 12/19/2013   Procedure: RIGHT C3-4.C4-5 AND C5-6 FORAMINOTOMIES;  Surgeon: Lynwood FORBES Better, MD;  Location: [facility] OR;  Service: Orthopedics;  Laterality: N/A;   TOTAL HIP ARTHROPLASTY Left 01/31/2020   Procedure: LEFT TOTAL HIP ARTHROPLASTY ANTERIOR APPROACH;  Surgeon: Vernetta Lonni GRADE, MD;  Location: [facility];  Service: Orthopedics;  Laterality: Left;   TOTAL SHOULDER ARTHROPLASTY Right 11/15/2019   Procedure: RIGHT TOTAL SHOULDER ARTHROPLASTY;  Surgeon: Addie Cordella Hamilton, MD;  Location: [facility];  Service: Orthopedics;  Laterality: Right;   TOTAL SHOULDER REVISION Right 06/11/2021   Procedure: RIGHT SHOULDER CONVERSION TOTAL SHOULDER ARTHROPLASTY to REVERSE TOTAL SHOULDER ARTHROPLASTY;  Surgeon: Addie Cordella Hamilton, MD;  Location: [facility];  Service: Orthopedics;  Laterality: Right;   TUBAL LIGATION     UPPER GASTROINTESTINAL ENDOSCOPY     XI ROBOTIC ASSISTED HIATAL HERNIA REPAIR N/A 10/31/2020   Procedure: XI ROBOTIC ASSISTED HIATAL HERNIA REPAIR WITH PARTIAL FUNDOPLICATION;  Surgeon: Tanda Locus, MD;  Location: [facility];  Service: General;  Laterality: N/A;   Patient Active Problem List   Diagnosis Date Noted   Arthritis of carpometacarpal [facility]) joint of left thumb 11/27/2023   Gonorrhea 12/03/2022   Acute cystitis with hematuria 11/26/2022   Tear of left gluteus minimus tendon 11/04/2022   Instability of prosthetic shoulder joint    S/P reverse total shoulder arthroplasty, right 06/11/2021   Hypokalemia 11/25/2020   S/P laparoscopic fundoplication 10/31/2020   Status post total replacement of left hip 01/31/2020   Unilateral primary osteoarthritis, left hip 12/21/2019   S/P shoulder replacement, right 11/15/2019   Diverticulosis 04/14/2019   Chronic diastolic CHF (congestive heart failure) (HCC) 04/14/2019   Unstable angina (HCC) 10/20/2017   Chronic pain 09/23/2016   Pre-diabetes 09/02/2016   Recurrent major depressive disorder, in partial remission 07/14/2016   Seizures (HCC)  07/14/2016   GAD (generalized anxiety disorder) 07/14/2016   Insomnia 09/05/2014   Allergic rhinitis 09/05/2014   Cervical spondylosis without myelopathy 12/19/2013    Class: Chronic   Neural foraminal stenosis of cervical spine 12/19/2013   Cervical radiculitis 09/19/2013   Lumbosacral spondylosis without myelopathy 11/16/2012   Postlaminectomy syndrome, lumbar region 11/16/2012   Herpes simplex virus (HSV) infection 10/31/2008   Hyperlipidemia with target LDL less than 100 10/31/2008   Primary hypertension 10/31/2008   GERD 10/31/2008   SPINAL STENOSIS OF LUMBAR REGION 10/31/2008   Osteoporosis 10/31/2008   SPONDYLOLISTHESIS 10/31/2008   SYNCOPE 10/31/2008   Sleep apnea 10/31/2008    ONSET DATE: 11/27/23  REFERRING DIAG: M18.12 (ICD-10-CM) - Primary osteoarthritis of first carpometacarpal joint of left hand    THERAPY DIAG:  Stiffness of left  hand, not elsewhere classified  Muscle weakness (generalized)  Localized edema  Other lack of coordination  Rationale for Evaluation and Treatment: Rehabilitation  SUBJECTIVE:   SUBJECTIVE STATEMENT: Patient reports no pain and only minimal swelling in the L hand. She has been practicing her guitar for short durations.  Upon review of progress to date, pt in agreement with decreased frequency of treatment.  Pt accompanied by: self  PERTINENT HISTORY:  Pt with L thumb carpometacarpal arthritis with associated MCP hyperextension. pt underwent the following procedures on 11/27/23:  Left thumb carpometacarpal arthroplasty Left tendon transfer abductor pollicis longus to flexor carpi radialis Left partial trapezoid ectomy Left de Quervain release surgery Left thumb MCP capsulodesis and pinning  PMHx: Reverse tSA,anxiety, arthritis , bipolar CHF, COPD, CAD depression, DM fibromyalgia, head injury, MI, seizure,b ack surg, L THA   Pt went to MD on 12/30/23 Plan: Pin removed today without incident.  Continue with removable splint  fabricated by OT.  Continue with range of motion exercises for 4 weeks.  Follow-up at that juncture for recheck and likely progression of strengthening.   PRECAUTIONS: Other: splint at all times, wait to begin ROM after pin removed , hx of seizures  RED FLAGS: None   WEIGHT BEARING RESTRICTIONS: No  PAIN: Are you having pain? No   FALLS: Has patient fallen in last 6 months? Yes. Number of falls 2 since surgery; fell this weekend - moving a laundry basket and fell - didn't have her splint on.  LIVING ENVIRONMENT: Lives with: lives with their family   PLOF: Independent  PATIENT GOALS: improve functional use and pain in LUE  NEXT MD VISIT: 02/29/2024  OBJECTIVE:  Note: Objective measures were completed at Evaluation unless otherwise noted.  HAND DOMINANCE: Right  ADLs:Pt is currently using her RUE for ADLs   FUNCTIONAL OUTCOME MEASURES: EVAL: Quick Dash: 93% disability 02/04/24: Quick Dash 6.8%   UPPER EXTREMITY ROM:    Active ROM Right eval Left eval  Shoulder flexion    Shoulder abduction    Shoulder adduction    Shoulder extension    Shoulder internal rotation    Shoulder external rotation    Elbow flexion    Elbow extension    Wrist flexion    Wrist extension    Wrist ulnar deviation    Wrist radial deviation    Wrist pronation    Wrist supination    (Blank rows = not tested)  Active ROM Right eval Left 02/04/24  Thumb MCP (0-60)  30*  Thumb IP (0-80)  55*  Thumb Radial abd/add (0-55)   equal to R   Thumb Palmar abd/add (0-45)  Equal to R   Thumb Opposition to Small Finger  intact   Index MCP (0-90)     Index PIP (0-100)     Index DIP (0-70)      Long MCP (0-90)      Long PIP (0-100)      Long DIP (0-70)      Ring MCP (0-90)      Ring PIP (0-100)      Ring DIP (0-70)      Little MCP (0-90)      Little PIP (0-100)      Little DIP (0-70)      (Blank rows = not tested)  HAND FUNCTION: 01/21/24- Grip strength: Right:33.9,42.5, 46.7 lbs   Left: 15.4, 20.7, 23.1 lbs Average-  41 lbs ( R);  19.7 lbs (L)  02/04/24 Left 27.5, 31.7, 29.7 Right  44.5, 45.1, 42.5 Average: Left: 29.6 Right: 44.0  COORDINATION:  01/21/24 9-hole peg test: Right 26 seconds ;  Left 31 seconds  02/04/24 Right 22.24 sec; Left 27.49 sec  SENSATION: Not tested  EDEMA: moderate  COGNITION: Overall cognitive status: Within functional limits for tasks assessed   OBSERVATIONS: Pleasant female arrived with hand wrapped in temporary  thumb spica cast . Moderate edema at dorsal hand when cast was removed pt reports cast was too tight and rubbing her.   TODAY'S TREATMENT: - Self Care education and training completed for duration as noted below including:  Pt assisted to locate adapted equipment ideas to increase safety and independence with ADLs ie) battery operated can opener with option printed for her to share with family.  Pt educated in searching for equipment to assist people with arthritis, s/p stroke or SCI.  Other options found are to assist with chopping, neutral wrist position and to decrease tight grip.  - Therapeutic exercises completed for duration as noted below including:  Pt completed arm bike in sitting for 5+ minutes with average RPM of 30 at level 2 for endurance, ROM, and strengthening of affected extremity using reciprocal arm movements. Pt alternating direction of pedaling halfway through. Intermittent cues provided to maintain consistent grasp maintenance.  Pt reported good comfort upon completion of task.  Pt reported increased thumb opposition to touch near the base of her pinkie finger.  Pt continues to practice ROM activities with encouragement to work on C position of fingers and thumb to avoid hyperextension of CMC joint with grasp etc.   - Therapeutic activities completed for duration as noted below including:   Therapist reviewed goals with patient and updated patient progression.  No additional functional limitations  identified.  ROM has improved    Active ROM Left 02/04/24  Thumb MCP (0-60) 30*  Thumb IP (0-80) 55*  Thumb Radial abd/add (0-55)  equal to R   Thumb Palmar abd/add (0-45) Equal to R   Thumb Opposition to Small Finger intact     Grip strength improved by average of 10 lbs in 2 weeks.  Coordination improved by 3+ seconds on 9 hole peg test.  Quick Dash score improved from 93% to 6.8%.  Patient engaged in a tie blanket activity to address functional use of the right hand and thumb. Activity required repetitive grasp, pinch, and pull motions to manipulate and tie fabric strips, promoting thumb opposition, web space opening, and graded grip strength. Patient demonstrated fairly good success with maintaining thumb positioning and full opposition during task with min verbal cues for joint alignment and pacing to minimize compensatory wrist movements.  OT provided demonstration to facilitate appropriate thumb involvement and reduce overuse of the index and middle fingers. Patient tolerated activity well with gradual improvement in control and endurance, requiring intermittent rest breaks due to mild discomfort and fatigue at the thumb base. Education provided on joint protection, pacing, and use of adaptive grasp techniques to reduce strain and promote safe, functional use of the R hand during similar bilateral tasks at home.  PATIENT EDUCATION: Education details: Goal review and functional task progression Person educated: Patient Education method: Explanation, Demonstration, Tactile cues, and Verbal cues Education comprehension: verbalized understanding, returned demonstration, verbal cues required, tactile cues required, and needs further education  HOME EXERCISE PROGRAM: 01/05/24: Tendon glide, thumb opposition, wrist flex/ext and scar massage 01/21/24: Joint protection, putty exercises (yellow), isometric exercises 01/25/24: Coordination activities 01/28/24: Thumb ROM and strengthening  exercises- access code; [email or web address]  GOALS:  Goals reviewed with patient? Yes  SHORT TERM GOALS: Target date: 01/14/24  I with splint wear, care and precautions. Goal status: MET  2.  I with inital HEP. Goal status: MET  3.  I with edema control techniques. Goal status: MET  4.  Pt will increase thumb MP flexion by 10* from inital measurement for increased functional use Baseline: NT due to prec. Goal status: MET  5.  Pt will increase thumb IP flexion by 10* from inital measurement Baseline: NT due to prec. Goal status: MET    LONG TERM GOALS: Target date: 03/09/24  I with updated HEP. Goal status: IN Progress  2.  Pt will demonstrate improved LUE functional use as evidenced by improving Quick DASH score to 75% disability or better. Baseline: 93% disbaility Goal status: MET  3.  Pt will demonstrate grip strength of  35+ lbs or greater for increased functional use. 01/21/24: Average-  41 lbs ( R);  19.7 lbs (L) Goal status: Revised 02/04/24: Left 27.5, 31.7, 29.7  4.  Pt will resume use of LUE as a non dominant assist for ADLs with pain no greater than 3/10. Goal status: MET  5.  Pt will demonstrate ability to oppose 5th digit with thumb for increased functional use of LUE. Baseline: NT Goal status: MET  New 02/04/24  6. Pt will resume leisure activities r/t playing the guitar and managing small beadwork tasks with good satisfaction in her abilities.  Baseline: beginning to try the guitar and hasn't done beadwork in quite awhile  ASSESSMENT:  CLINICAL IMPRESSION: Pt presents with improvements in LUE strength and coordination s/p multiple procedures for OA L thumb. She demonstrates good participation in OT activities to improve ROM, coordination and functional use of L thumb with verbal understanding of her HEP and joint protection with 5/5 short term and 4/5 long term goals met.  One goal was revised and additional goal added - leaving 3 remaining goals but with  decreased frequency recommended form 2x to 1x/week.  She will continue to benefit from skilled outpatient OT to improve functional use of L UE for ADLs and IADLs.   PERFORMANCE DEFICITS: in functional skills including ADLs, IADLs, coordination, dexterity, sensation, edema, ROM, strength, pain, flexibility, Fine motor control, Gross motor control, decreased knowledge of precautions, wound, skin integrity, and UE functional use, and psychosocial skills including coping strategies, environmental adaptation, habits, interpersonal interactions, and routines and behaviors.   IMPAIRMENTS: are limiting patient from ADLs, IADLs, rest and sleep, education, play, leisure, and social participation.   COMORBIDITIES: may have co-morbidities  that affects occupational performance. Patient will benefit from skilled OT to address above impairments and improve overall function.  REHAB POTENTIAL: Good  PLAN:  OT FREQUENCY: 2x/week  OT DURATION: 12 weeks  PLANNED INTERVENTIONS: 97168 OT Re-evaluation, 97535 self care/ADL training, 02889 therapeutic exercise, 97530 therapeutic activity, 97112 neuromuscular re-education, 97140 manual therapy, 97035 ultrasound, 97018 paraffin, 02989 moist heat, 97010 cryotherapy, 97014 electrical stimulation unattended, 97750 Physical Performance Testing, 02239 Orthotic Initial, 97763 Orthotic/Prosthetic subsequent, passive range of motion, energy conservation, coping strategies training, patient/family education, and DME and/or AE instructions  RECOMMENDED OTHER SERVICES: n/a  CONSULTED AND AGREED WITH PLAN OF CARE: Patient  PLAN FOR NEXT SESSION:   Strengthening activities - retest grip strength Guitar simulation Beadwork/dream catchers and vision modifications  Clarita LITTIE Pride, OT 02/04/2024, 12:41 PM

## 2024-02-08 ENCOUNTER — Ambulatory Visit: Admitting: Occupational Therapy

## 2024-02-08 DIAGNOSIS — R600 Localized edema: Secondary | ICD-10-CM

## 2024-02-08 DIAGNOSIS — R278 Other lack of coordination: Secondary | ICD-10-CM

## 2024-02-08 DIAGNOSIS — M6281 Muscle weakness (generalized): Secondary | ICD-10-CM

## 2024-02-08 DIAGNOSIS — M25642 Stiffness of left hand, not elsewhere classified: Secondary | ICD-10-CM

## 2024-02-08 DIAGNOSIS — M79642 Pain in left hand: Secondary | ICD-10-CM | POA: Diagnosis not present
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
## 2024-02-09 ENCOUNTER — Other Ambulatory Visit: Payer: Self-pay | Admitting: Nurse Practitioner

## 2024-02-09 ENCOUNTER — Other Ambulatory Visit: Payer: Self-pay | Admitting: *Deleted

## 2024-02-09 ENCOUNTER — Other Ambulatory Visit: Payer: Self-pay | Admitting: Cardiology

## 2024-02-09 DIAGNOSIS — B009 Herpesviral infection, unspecified: Secondary | ICD-10-CM

## 2024-02-10 ENCOUNTER — Other Ambulatory Visit: Payer: Self-pay | Admitting: Nurse Practitioner

## 2024-02-10 DIAGNOSIS — B009 Herpesviral infection, unspecified: Secondary | ICD-10-CM

## 2024-02-11 ENCOUNTER — Encounter: Admitting: Occupational Therapy

## 2024-02-12 ENCOUNTER — Other Ambulatory Visit: Payer: Self-pay | Admitting: *Deleted

## 2024-02-14 ENCOUNTER — Emergency Department (HOSPITAL_COMMUNITY)

## 2024-02-14 ENCOUNTER — Other Ambulatory Visit: Payer: Self-pay

## 2024-02-14 ENCOUNTER — Encounter (HOSPITAL_COMMUNITY): Payer: Self-pay

## 2024-02-14 ENCOUNTER — Emergency Department (HOSPITAL_COMMUNITY)
Admission: EM | Admit: 2024-02-14 | Discharge: 2024-02-14 | Disposition: A | Discharge status: Home / Self Care | Attending: Emergency Medicine | Admitting: Emergency Medicine

## 2024-02-14 DIAGNOSIS — M545 Low back pain, unspecified: Secondary | ICD-10-CM | POA: Insufficient documentation

## 2024-02-14 DIAGNOSIS — Y92410 Unspecified street and highway as the place of occurrence of the external cause: Secondary | ICD-10-CM | POA: Insufficient documentation

## 2024-02-14 DIAGNOSIS — M542 Cervicalgia: Secondary | ICD-10-CM | POA: Diagnosis present

## 2024-02-14 DIAGNOSIS — G44319 Acute post-traumatic headache, not intractable: Secondary | ICD-10-CM | POA: Diagnosis not present

## 2024-02-14 DIAGNOSIS — G44309 Post-traumatic headache, unspecified, not intractable: Secondary | ICD-10-CM

## 2024-02-14 DIAGNOSIS — S161XXA Strain of muscle, fascia and tendon at neck level, initial encounter: Secondary | ICD-10-CM | POA: Insufficient documentation

## 2024-02-14 NOTE — Discharge Instructions (Addendum)
 Follow-up with your physician for recheck next week.
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
## 2024-02-15 ENCOUNTER — Ambulatory Visit: Admitting: Occupational Therapy

## 2024-02-15 ENCOUNTER — Telehealth: Payer: Self-pay | Admitting: Pharmacist

## 2024-02-15 DIAGNOSIS — E785 Hyperlipidemia, unspecified: Secondary | ICD-10-CM

## 2024-02-15 MED ORDER — ROSUVASTATIN CALCIUM 10 MG PO TABS: 10.0000 mg | ORAL_TABLET | Freq: Every day | ORAL | 1 refills | Status: AC

## 2024-02-15 NOTE — Telephone Encounter (Signed)
   This patient is appearing on a report for being at risk of failing the adherence measure for cholesterol (statin) medications this calendar year.   Medication: rosuvastatin  Last fill date: 02/12/24 for 30 day supply  Contacted pharmacy to facilitate refills. and Will collaborate with provider to facilitate refill needs.sent in 90 days supply Appears pharmacy is only filling it for 30DS  Mliss Tarry Griffin, PharmD, BCACP, CPP Clinical Pharmacist, [facility]

## 2024-02-16 ENCOUNTER — Ambulatory Visit: Payer: Self-pay

## 2024-02-16 ENCOUNTER — Encounter: Payer: Self-pay | Admitting: Family

## 2024-02-16 ENCOUNTER — Ambulatory Visit: Admitting: Family

## 2024-02-16 VITALS — BP 128/80 | HR 79 | Temp 98.0°F | Ht 62.0 in | Wt 130.6 lb

## 2024-02-16 DIAGNOSIS — M5441 Lumbago with sciatica, right side: Secondary | ICD-10-CM | POA: Diagnosis not present

## 2024-02-16 DIAGNOSIS — M5442 Lumbago with sciatica, left side: Secondary | ICD-10-CM | POA: Diagnosis not present

## 2024-02-16 DIAGNOSIS — Z09 Encounter for follow-up examination after completed treatment for conditions other than malignant neoplasm: Secondary | ICD-10-CM

## 2024-02-16 DIAGNOSIS — G8929 Other chronic pain: Secondary | ICD-10-CM
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
## 2024-02-16 NOTE — Patient Instructions (Signed)
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
## 2024-02-18 ENCOUNTER — Encounter: Admitting: Occupational Therapy

## 2024-02-22 ENCOUNTER — Ambulatory Visit: Admitting: Occupational Therapy

## 2024-02-24 ENCOUNTER — Encounter: Admitting: Occupational Therapy

## 2024-02-28 NOTE — Progress Notes (Deleted)
   [name] - 68 y.o. female MRN [id number]  Date of birth: 02-19-1956  Office Visit Note: Visit Date: 02/29/2024 PCP: Gladis Mustard, FNP Referred by: Gladis, [name], *  Subjective:  HPI: [name] is a 68 y.o. female who presents today for follow up 13 weeks status post : Left thumb carpometacarpal arthroplasty Left tendon transfer abductor pollicis longus to flexor carpi radialis Left partial trapezoid ectomy Left de Quervain release surgery Left thumb MCP capsulodesis and pinning.  Pertinent ROS were reviewed with the patient and found to be negative unless otherwise specified above in HPI.   Assessment & Plan: Visit Diagnoses: No diagnosis found.  Plan: ***  Follow-up: No follow-ups on file.   Meds & Orders: No orders of the defined types were placed in this encounter.  No orders of the defined types were placed in this encounter.    Procedures: No procedures performed       Objective:   Vital Signs: There were no vitals taken for this visit.  Ortho Exam ***  Imaging: No results found.   Anshul Afton Alderton, M.D. [facility], Hand Surgery

## 2024-02-29 ENCOUNTER — Ambulatory Visit: Attending: Orthopedic Surgery | Admitting: Occupational Therapy

## 2024-02-29 ENCOUNTER — Encounter: Admitting: Orthopedic Surgery

## 2024-03-01 ENCOUNTER — Telehealth: Payer: Self-pay | Admitting: Occupational Therapy

## 2024-03-01 NOTE — Telephone Encounter (Signed)
 This is to document my attempt to call patient after no-show for OT appt yesterday AM.     Primary phone number(s) was used in efforts to contact the patient.   Voice mail was left requesting the patient call the clinic back at [phone] to confirm need for additional appointment or discharge as POC expires next week.  Hold placed on this therapist's schedule for 03/07/24 at 845 AM if pt should need additional appt.

## 2024-03-03 ENCOUNTER — Encounter: Admitting: Occupational Therapy

## 2024-03-09 ENCOUNTER — Other Ambulatory Visit: Payer: Self-pay | Admitting: Nurse Practitioner

## 2024-03-09 DIAGNOSIS — B009 Herpesviral infection, unspecified: Secondary | ICD-10-CM

## 2024-03-15 ENCOUNTER — Ambulatory Visit: Payer: Self-pay | Admitting: Nurse Practitioner

## 2024-03-15 ENCOUNTER — Encounter: Payer: Self-pay | Admitting: Nurse Practitioner

## 2024-03-15 VITALS — BP 138/80 | HR 73 | Temp 98.1°F | Ht 62.0 in | Wt 134.0 lb

## 2024-03-15 DIAGNOSIS — Q762 Congenital spondylolisthesis: Secondary | ICD-10-CM

## 2024-03-15 DIAGNOSIS — F5101 Primary insomnia: Secondary | ICD-10-CM

## 2024-03-15 DIAGNOSIS — B009 Herpesviral infection, unspecified: Secondary | ICD-10-CM

## 2024-03-15 DIAGNOSIS — F1120 Opioid dependence, uncomplicated: Secondary | ICD-10-CM | POA: Insufficient documentation

## 2024-03-15 DIAGNOSIS — M47817 Spondylosis without myelopathy or radiculopathy, lumbosacral region: Secondary | ICD-10-CM

## 2024-03-15 MED ORDER — VALACYCLOVIR HCL 500 MG PO TABS: ORAL_TABLET | ORAL | 1 refills | Status: AC

## 2024-03-15 MED ORDER — TRAZODONE HCL 150 MG PO TABS: 150.0000 mg | ORAL_TABLET | Freq: Every evening | ORAL | 1 refills | Status: DC | PRN

## 2024-03-15 MED ORDER — HYDROCODONE-ACETAMINOPHEN 7.5-325 MG/15ML PO SOLN: 15.0000 mL | Freq: Four times a day (QID) | ORAL | 0 refills | Status: DC | PRN

## 2024-03-15 MED ORDER — VALACYCLOVIR HCL 1 G PO TABS: 1000.0000 mg | ORAL_TABLET | Freq: Three times a day (TID) | ORAL | 2 refills | Status: AC

## 2024-03-15 NOTE — Progress Notes (Signed)
 Subjective:    Patient ID: [name], female    DOB: April 26, 1955, 68 y.o.   MRN: [id number]   Chief Complaint: pain management   HPI:  [name] is a 68 y.o. who identifies as a female who was assigned female at birth.   Social history: Lives with: her granddaughter lives with her Work history: disability   Comes in today for follow up of the following chronic medical issues:  1. Lumbosacral spondylosis without myelopathy 2. SPONDYLOLISTHESIS 3. Opoid dependence  Pain assessment: Cause of pain- spondylolisthesis Pain location- mainly in lumbar spine Pain on scale of 1-10- 5-6/10 Frequency- daily What increases pain-to much activity What makes pain Better-rest helps Effects on ADL - none most days Any change in general medical condition-none  Current opioids rx- norco 7.5/325 # meds rx- 90 Effectiveness of current meds-helps Adverse reactions from pain meds-none Morphine equivalent- 22.5  Pill count performed-No Last drug screen - 03/27/22 ( high risk q79m, moderate risk q52m, low risk yearly ) Urine drug screen today- No Was the [facility] reviewed- yes  If yes were their any concerning findings? - no   Overdose risk: 1    09/12/2021    3:09 PM  Opioid Risk   Alcohol  0  Illegal Drugs 0  Rx Drugs 0  Alcohol  0  Illegal Drugs 0  Rx Drugs 0  Age between 16-45 years  0  History of Preadolescent Sexual Abuse 3  Psychological Disease 2  ADD Negative  OCD Negative  Bipolar Positive  Depression 1  Opioid Risk Tool Scoring 6  Opioid Risk Interpretation Moderate Risk     Pain contract signed on: 04/02/22    New complaints: None today  Allergies  Allergen Reactions   Codeine  Other (See Comments)    I will have a heart attack.   Morphine And Codeine  Other (See Comments)    It will cause me to have a heart attack.   Ambien  [Zolpidem  Tartrate] Nausea And Vomiting   Clonidine  Derivatives Nausea And Vomiting    GERD - caused acid reflux per  pt   Metformin  And Related Nausea And Vomiting    Cramping from metformin    Lyrica  [Pregabalin ] Swelling and Other (See Comments)    Weight gain   Neurontin [Gabapentin] Other (See Comments)    Causes elevated LFTs    Outpatient Encounter Medications as of 03/15/2024  Medication Sig   Accu-Chek Softclix Lancets lancets test blood sugar up to 4 times a day as directed. dx r73.03   acyclovir  ointment (ZOVIRAX ) 5 % apply 1 application topically every 3 (three) hours as needed (outbreaks).   albuterol  (VENTOLIN  HFA) 108 (90 Base) MCG/ACT inhaler inhale 2 puffs every 6 hours as needed for shortness of breath and wheezing.   Alcohol  Swabs  (B-D SINGLE USE SWABS  REGULAR) PADS use 1 pad daily when checking blood sugar. r73.03   blood glucose meter kit and supplies Dispense based on patient and insurance preference. Use up to four times daily as directed. (FOR ICD-10 E10.9, E11.9).   busPIRone  (BUSPAR ) 10 MG tablet Take 1 tablet (10 mg total) by mouth 2 (two) times daily as needed.   butalbital -acetaminophen -caffeine  (FIORICET ) 50-325-40 MG tablet take 1 tablet by mouth every 6 (six) hours as needed for headache.   butalbital -acetaminophen -caffeine  (FIORICET ) 50-325-40 MG tablet take 1 tablet by mouth every 6 (six) hours as needed for headache.   carvedilol  (COREG ) 3.125 MG tablet TAKE 1/2 (0.5) TABLET TWICE A DAY WITH A MEAL; BLOOD PRESSURE OVER  150 BEFORE TAKING.   celecoxib  (CELEBREX ) 200 MG capsule Take 1 capsule (200 mg total) by mouth daily.   cephALEXin  (KEFLEX ) 500 MG capsule Take 1 capsule (500 mg total) by mouth 4 (four) times daily.   Continuous Glucose Sensor (FREESTYLE LIBRE 3 PLUS SENSOR) MISC Apply 1 each topically every 14 (fourteen) days. Change sensor every 15 days.   dexlansoprazole  (DEXILANT ) 60 MG capsule Take 1 capsule (60 mg total) by mouth daily.   Dextromethorphan-GG-APAP (CORICIDIN HBP COLD/COUGH/FLU) 10-200-325 MG/15ML LIQD Take 30 mLs by mouth every 4 (four) hours as needed  (cough). (Patient not taking: Reported on 01/25/2024)   diclofenac  Sodium (VOLTAREN ) 1 % GEL APPLY 4 GRAMS TOPICALLY 4 TIMES A DAY. (Patient taking differently: Apply 4 g topically 4 (four) times daily as needed (pain).)   DULoxetine  (CYMBALTA ) 60 MG capsule Take 1 capsule (60 mg total) by mouth at bedtime.   empagliflozin  (JARDIANCE ) 25 MG TABS tablet Take 1 tablet (25 mg total) by mouth daily.   escitalopram  (LEXAPRO ) 20 MG tablet Take 1 tablet (20 mg total) by mouth daily.   estradiol  (ESTRACE ) 0.1 MG/GM vaginal cream Place 1 Applicatorful vaginally 3 (three) times a week.   FIBER PO Take 1 tablet by mouth in the morning and at bedtime.   fludrocortisone  (FLORINEF ) 0.1 MG tablet take 1 tablet (100mcg total) by mouth every other day.   fluticasone  (FLONASE ) 50 MCG/ACT nasal spray USE 1 SPRAY IN EACH NOSTRIL ONCE DAILY. (Patient taking differently: Place 1 spray into both nostrils daily as needed for allergies.)   fluticasone  furoate-vilanterol (BREO ELLIPTA ) 100-25 MCG/ACT AEPB Inhale 1 puff by mouth once daily   furosemide  (LASIX ) 40 MG tablet TAKE 1 TABLET DAILY AS NEEDED FOR EXCESSIVE FLUID.   Glucagon , rDNA, (GLUCAGON  EMERGENCY) 1 MG KIT Inject 1 mg into the skin as needed.   glucose blood (ACCU-CHEK GUIDE) test strip TEST BLOOD SUGAR UP TO 4 TIMES A DAY AS DIRECTED. R73.03   hydrALAZINE  (APRESOLINE ) 25 MG tablet take 1 tablet by mouth 3 times daily as needed (for severe hypertension/systolic number 170 or greater).   ipratropium (ATROVENT ) 0.02 % nebulizer solution USE 1 VIAL ( ) IN NEBULIZER EVERY 6 HOURS AS NEEDED FOR WHEEZING OR SHORTNESS OF BREATH.   isosorbide  mononitrate (IMDUR ) 60 MG 24 hr tablet Take 1 tablet (60 mg total) by mouth 2 (two) times daily.   lamoTRIgine  (LAMICTAL ) 150 MG tablet Take 1 tablet (150 mg total) by mouth daily.   levETIRAcetam  (KEPPRA ) 500 MG tablet Take 1 tablet (500 mg total) by mouth 2 (two) times daily.   levocetirizine (XYZAL ) 5 MG tablet take 1 tablet  by mouth every morning.   lidocaine  (LIDODERM ) 5 % place 1 patch onto the skin daily. remove & discard patch within 12 hours or as directed by md   lidocaine  (XYLOCAINE ) 5 % ointment apply to affected area 3 times a day as needed for mild or moderate pain.   methocarbamol  (ROBAXIN ) 500 MG tablet take 1 tablet (500 milligram total) by mouth 4 (four) times daily.   nitroGLYCERIN  (NITROSTAT ) 0.4 MG SL tablet place one (1) tablet under tongue every 5 minutes up to (3) doses as needed for chest pain.   ondansetron  (ZOFRAN ) 4 MG tablet take 1 tablet by mouth every 8 hours as needed for nausea and vomiting.   oxyCODONE  (ROXICODONE ) 5 MG immediate release tablet Take 1 tablet (5 mg total) by mouth every 6 (six) hours as needed.   polyethylene glycol (MIRALAX  / GLYCOLAX ) 17 g  packet Take 17 g by mouth 2 (two) times daily.   promethazine -dextromethorphan (PROMETHAZINE -DM) 6.25-15 MG/5ML syrup Take 5 mLs by mouth 3 (three) times daily as needed for cough. (Patient not taking: Reported on 01/25/2024)   rosuvastatin  (CRESTOR ) 10 MG tablet Take 1 tablet (10 mg total) by mouth daily.   traZODone  (DESYREL ) 150 MG tablet take 1 tablet (150 MILLIGRAM total) by mouth at bedtime.   valACYclovir  (VALTREX ) 1000 MG tablet take 1 tablet (1,000 MILLIGRAM total) by mouth 3 (three) times daily as needed (with active outbreaks.).   valACYclovir  (VALTREX ) 500 MG tablet take (1) tablet twice daily.   No facility-administered encounter medications on file as of 03/15/2024.    Past Surgical History:  Procedure Laterality Date   BACK SURGERY     BREAST EXCISIONAL BIOPSY Right    1990s   BREAST SURGERY     lumpectomy   CARDIAC CATHETERIZATION  10/06/2011   CATARACT EXTRACTION W/PHACO Right 07/31/2017   Procedure: CATARACT EXTRACTION PHACO AND INTRAOCULAR LENS PLACEMENT (IOC);  Surgeon: Harrie Agent, MD;  Location: [facility];  Service: Ophthalmology;  Laterality: Right;  CDE: 2.33   CATARACT EXTRACTION W/PHACO Left  08/14/2017   Procedure: CATARACT EXTRACTION PHACO AND INTRAOCULAR LENS PLACEMENT (IOC);  Surgeon: Harrie Agent, MD;  Location: [facility];  Service: Ophthalmology;  Laterality: Left;  CDE: 2.74   CHOLECYSTECTOMY     CYSTOSCOPY     stone   DIAGNOSTIC LAPAROSCOPY     laparoscopic cholecystectomy   DOPPLER ECHOCARDIOGRAPHY  2009   ESOPHAGOGASTRODUODENOSCOPY N/A 10/31/2020   Procedure: ESOPHAGOGASTRODUODENOSCOPY (EGD);  Surgeon: Tanda Locus, MD;  Location: [facility];  Service: General;  Laterality: N/A;   EYE SURGERY Bilateral 08/14/2017   cataract removal   FINGER ARTHROPLASTY Left 11/27/2023   Procedure: ARTHROPLASTY, FINGER;  Surgeon: Arlinda Buster, MD;  Location: [facility];  Service: Orthopedics;  Laterality: Left;  LEFT THUMB CARPOMATACARPAL JOINT ARTHROPLASTY WITH INTERNAL BRACE AND METACARPOPHALANGEAL JOINT CAPSULODESIS PINNING   GLUTEUS MINIMUS REPAIR Left 11/04/2022   Procedure: LEFT GLUTEUS MEDIUS REPAIR;  Surgeon: Genelle Standing, MD;  Location: [facility];  Service: Orthopedics;  Laterality: Left;   head up tilt table testing  06/15/2007   Danelle Birmingham   HEMORRHOID SURGERY     HERNIA REPAIR     insertion of implatable loop recorder  08/11/2007   Danelle Birmingham   POSTERIOR CERVICAL FUSION/FORAMINOTOMY N/A 12/19/2013   Procedure: RIGHT C3-4.C4-5 AND C5-6 FORAMINOTOMIES;  Surgeon: Agent FORBES Better, MD;  [facility];  Service: Orthopedics;  Laterality: N/A;   TOTAL HIP ARTHROPLASTY Left 01/31/2020   Procedure: LEFT TOTAL HIP ARTHROPLASTY ANTERIOR APPROACH;  Surgeon: Vernetta Lonni GRADE, MD;  Location: [facility];  Service: Orthopedics;  Laterality: Left;   TOTAL SHOULDER ARTHROPLASTY Right 11/15/2019   Procedure: RIGHT TOTAL SHOULDER ARTHROPLASTY;  Surgeon: Addie Cordella Hamilton, MD;  Location: [facility];  Service: Orthopedics;  Laterality: Right;   TOTAL SHOULDER REVISION Right 06/11/2021   Procedure: RIGHT SHOULDER CONVERSION TOTAL SHOULDER ARTHROPLASTY to REVERSE TOTAL SHOULDER  ARTHROPLASTY;  Surgeon: Addie Cordella Hamilton, MD;  Location: [facility];  Service: Orthopedics;  Laterality: Right;   TUBAL LIGATION     UPPER GASTROINTESTINAL ENDOSCOPY     XI ROBOTIC ASSISTED HIATAL HERNIA REPAIR N/A 10/31/2020   Procedure: XI ROBOTIC ASSISTED HIATAL HERNIA REPAIR WITH PARTIAL FUNDOPLICATION;  Surgeon: Tanda Locus, MD;  Location: [facility];  Service: General;  Laterality: N/A;    Family History  Problem Relation Age of Onset   Heart attack Father  Mental illness Father    Mental illness Mother    Heart attack Brother        stents   Alcohol  abuse Brother    Heart disease Brother    Drug abuse Brother    Diabetes Brother    Colon cancer Maternal Aunt    Cirrhosis Brother    Stomach cancer Neg Hx    Esophageal cancer Neg Hx    Rectal cancer Neg Hx           Review of Systems  Constitutional:  Negative for diaphoresis.  Eyes:  Negative for pain.  Respiratory:  Negative for shortness of breath.   Cardiovascular:  Negative for chest pain, palpitations and leg swelling.  Gastrointestinal:  Negative for abdominal pain.  Endocrine: Negative for polydipsia.  Skin:  Negative for rash.  Neurological:  Negative for dizziness, weakness and headaches.  Hematological:  Does not bruise/bleed easily.  All other systems reviewed and are negative.      Objective:   Physical Exam Vitals and nursing note reviewed.  Constitutional:      General: She is not in acute distress.    Appearance: Normal appearance. She is well-developed.  Neck:     Vascular: No carotid bruit or JVD.  Cardiovascular:     Rate and Rhythm: Normal rate and regular rhythm.     Heart sounds: Normal heart sounds.  Pulmonary:     Effort: Pulmonary effort is normal. No respiratory distress.     Breath sounds: Normal breath sounds. No wheezing or rales.  Chest:     Chest wall: No tenderness.  Abdominal:     General: Bowel sounds are normal. There is no distension or abdominal bruit.      Palpations: Abdomen is soft. There is no hepatomegaly, splenomegaly, mass or pulsatile mass.     Tenderness: There is no abdominal tenderness.  Musculoskeletal:        General: Normal range of motion.     Cervical back: Normal range of motion and neck supple.  Lymphadenopathy:     Cervical: No cervical adenopathy.  Skin:    General: Skin is warm and dry.  Neurological:     Mental Status: She is alert and oriented to person, place, and time.     Deep Tendon Reflexes: Reflexes are normal and symmetric.  Psychiatric:        Behavior: Behavior normal.        Thought Content: Thought content normal.        Judgment: Judgment normal.    BP 138/80   Pulse 73   Temp 98.1 F (36.7 C) (Temporal)   Ht 5' 2 (1.575 m)   Wt 134 lb (60.8 kg)   SpO2 95%   BMI 24.51 kg/m        Assessment & Plan:  SAFARI CINQUE in today with chief complaint of pan management  1. Lumbosacral spondylosis without myelopathy (Primary) Moist heat rest  2. SPONDYLOLISTHESIS  - HYDROcodone -acetaminophen  (NORCO) 7.5-325 MG tablet; Take 1 tablet by mouth in the morning, at noon, and at bedtime.  Dispense: 90 tablet; Refill: 0 - HYDROcodone -acetaminophen  (NORCO) 7.5-325 MG tablet; Take 1 tablet by mouth in the morning, at noon, and at bedtime.  Dispense: 90 tablet; Refill: 0 - HYDROcodone -acetaminophen  (NORCO) 7.5-325 MG tablet; Take 1 tablet by mouth in the morning, at noon, and at bedtime.  Dispense: 90 tablet; Refill: 0  3. Opoid dependence  The above assessment and management plan was discussed with the patient.  The patient verbalized understanding of and has agreed to the management plan. Patient is aware to call the clinic if symptoms persist or worsen. Patient is aware when to return to the clinic for a follow-up visit. Patient educated on when it is appropriate to go to the emergency department.   [name] Gladis, FNP

## 2024-03-16 ENCOUNTER — Telehealth: Payer: Self-pay | Admitting: Nurse Practitioner

## 2024-03-16 MED ORDER — HYDROCODONE-ACETAMINOPHEN 7.5-325 MG PO TABS: 1.0000 | ORAL_TABLET | Freq: Three times a day (TID) | ORAL | 0 refills | Status: AC

## 2024-03-16 NOTE — Telephone Encounter (Signed)
 Copied from CRM [id number]. Topic: Clinical - Prescription Issue >> Mar 16, 2024  9:47 AM [name] wrote: Reason for CRM: Pt called stated HYDROcodone -acetaminophen  (HYCET) 7.5-325 mg/15 ml solution is suppose to be TABLET and not solution. Please send to [facility] - [place], [place] - [address] [name] Erica NEEDS [place] [address] Phone: [phone] Fax: [phone]

## 2024-04-05 ENCOUNTER — Ambulatory Visit: Admitting: Nurse Practitioner

## 2024-04-05 ENCOUNTER — Encounter: Payer: Self-pay | Admitting: Nurse Practitioner

## 2024-04-05 VITALS — BP 125/83 | HR 69 | Temp 98.3°F | Ht 62.0 in | Wt 136.0 lb

## 2024-04-05 DIAGNOSIS — J40 Bronchitis, not specified as acute or chronic: Secondary | ICD-10-CM

## 2024-04-05 MED ORDER — PROMETHAZINE-DM 6.25-15 MG/5ML PO SYRP: 5.0000 mL | ORAL_SOLUTION | Freq: Four times a day (QID) | ORAL | 0 refills | Status: DC | PRN

## 2024-04-05 MED ORDER — AZITHROMYCIN 250 MG PO TABS: ORAL_TABLET | ORAL | 0 refills | Status: DC

## 2024-04-05 MED ORDER — BENZONATATE 100 MG PO CAPS: 100.0000 mg | ORAL_CAPSULE | Freq: Two times a day (BID) | ORAL | 0 refills | Status: AC | PRN

## 2024-04-05 NOTE — Progress Notes (Signed)
 "  Subjective:    Patient ID: [name], female    DOB: 20-Oct-1955, 69 y.o.   MRN: [id number]   Chief Complaint: Nasal Congestion   URI  This is a new problem. The current episode started in the past 7 days. The problem has been gradually worsening. The maximum temperature recorded prior to her arrival was 100.4 - 100.9 F. The fever has been present for 1 to 2 days. Associated symptoms include congestion, coughing and rhinorrhea. Pertinent negatives include no abdominal pain, chest pain, headaches or rash. She has tried acetaminophen  for the symptoms. The treatment provided mild relief.     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   Hypokalemia 11/25/2020   S/P laparoscopic fundoplication 10/31/2020   Status post total replacement of left hip 01/31/2020   Unilateral primary osteoarthritis, left hip 12/21/2019   S/P shoulder replacement, right 11/15/2019   Diverticulosis 04/14/2019   Chronic diastolic CHF (congestive heart failure) (HCC) 04/14/2019   Unstable angina (HCC) 10/20/2017   Chronic pain 09/23/2016   Pre-diabetes 09/02/2016   Recurrent major depressive disorder, in partial remission 07/14/2016   Seizures (HCC) 07/14/2016   GAD (generalized anxiety disorder) 07/14/2016   Insomnia 09/05/2014   Allergic rhinitis 09/05/2014   Cervical spondylosis without myelopathy 12/19/2013    Class: Chronic   Neural foraminal stenosis of cervical spine 12/19/2013   Cervical radiculitis 09/19/2013   Lumbosacral spondylosis without myelopathy 11/16/2012   Postlaminectomy syndrome, lumbar region 11/16/2012   Herpes simplex virus (HSV)  infection 10/31/2008   Hyperlipidemia with target LDL less than 100 10/31/2008   Primary hypertension 10/31/2008   GERD 10/31/2008   SPINAL STENOSIS OF LUMBAR REGION 10/31/2008   Osteoporosis 10/31/2008   SPONDYLOLISTHESIS 10/31/2008   SYNCOPE 10/31/2008   Sleep apnea 10/31/2008       Review of Systems  Constitutional:  Negative for diaphoresis.  HENT:  Positive for congestion and rhinorrhea.   Eyes:  Negative for pain.  Respiratory:  Positive for cough. Negative for shortness of breath.   Cardiovascular:  Negative for chest pain, palpitations and leg swelling.  Gastrointestinal:  Negative for abdominal pain.  Endocrine: Negative for polydipsia.  Skin:  Negative for rash.  Neurological:  Negative for dizziness, weakness and headaches.  Hematological:  Does not bruise/bleed easily.  All other systems reviewed and are negative.      Objective:   Physical Exam Constitutional:      Appearance: Normal appearance.  Cardiovascular:     Rate and Rhythm: Normal rate and regular rhythm.     Heart sounds: Normal heart sounds.  Pulmonary:     Breath sounds: Normal breath sounds.  Skin:    General: Skin is warm.  Neurological:     General: No focal deficit present.     Mental Status: She is alert and oriented to person, place, and time.  Psychiatric:        Mood and Affect: Mood normal.        Behavior: Behavior normal.     BP 125/83   Pulse 69   Temp 98.3 F (36.8 C) (Temporal)   Ht 5' 2 (1.575 m)   Wt 136 lb (  61.7 kg)   SpO2 94%   BMI 24.87 kg/m        Assessment & Plan:   [name] in today with chief complaint of Nasal Congestion   1. Bronchitis (Primary) 1. Take meds as prescribed 2. Use a cool mist humidifier especially during the winter months and when heat has been humid. 3. Use saline nose sprays frequently 4. Saline irrigations of the nose can be very helpful if done frequently.  * 4X daily for 1 week*  * Use of a nettie pot can be  helpful with this. Follow directions with this* 5. Drink plenty of fluids 6. Keep thermostat turn down low 7.For any cough or congestion- tessalon  perles 8. For fever or aces or pains- take tylenol  or ibuprofen appropriate for age and weight.  * for fevers greater than 101 orally you may alternate ibuprofen and tylenol  every  3 hours.    Meds ordered this encounter  Medications   azithromycin  (ZITHROMAX  Z-PAK) 250 MG tablet    Sig: As directed    Dispense:  6 tablet    Refill:  0    Supervising Provider:   DETTINGER, JOSHUA A [[id number]]   benzonatate  (TESSALON ) 100 MG capsule    Sig: Take 1 capsule (100 mg total) by mouth 2 (two) times daily as needed for cough.    Dispense:  20 capsule    Refill:  0    Supervising Provider:   DETTINGER, JOSHUA A [[id number]]   promethazine -dextromethorphan (PROMETHAZINE -DM) 6.25-15 MG/5ML syrup    Sig: Take 5 mLs by mouth 4 (four) times daily as needed for cough.    Dispense:  118 mL    Refill:  0    Supervising Provider:   MARYANNE CHEW A [[id number]]      The above assessment and management plan was discussed with the patient. The patient verbalized understanding of and has agreed to the management plan. Patient is aware to call the clinic if symptoms persist or worsen. Patient is aware when to return to the clinic for a follow-up visit. Patient educated on when it is appropriate to go to the emergency department.   [name] Gladis, FNP    "

## 2024-04-05 NOTE — Patient Instructions (Addendum)
 1. Take meds as prescribed 2. Use a cool mist humidifier especially during the winter months and when heat has been humid. 3. Use saline nose sprays frequently 4. Saline irrigations of the nose can be very helpful if done frequently.  * 4X daily for 1 week*  * Use of a nettie pot can be helpful with this. Follow directions with this* 5. Drink plenty of fluids 6. Keep thermostat turn down low 7.For any cough or congestion- tessalon perles 8. For fever or aces or pains- take tylenol or ibuprofen appropriate for age and weight.  * for fevers greater than 101 orally you may alternate ibuprofen and tylenol every  3 hours.

## 2024-04-13 ENCOUNTER — Other Ambulatory Visit: Payer: Self-pay | Admitting: Nurse Practitioner

## 2024-04-13 DIAGNOSIS — B009 Herpesviral infection, unspecified: Secondary | ICD-10-CM

## 2024-04-14 ENCOUNTER — Ambulatory Visit: Attending: Cardiology | Admitting: Cardiology

## 2024-04-14 ENCOUNTER — Other Ambulatory Visit: Payer: Self-pay | Admitting: Nurse Practitioner

## 2024-04-14 ENCOUNTER — Encounter: Payer: Self-pay | Admitting: Cardiology

## 2024-04-14 VITALS — BP 120/86 | HR 88 | Ht 62.0 in | Wt 133.0 lb

## 2024-04-14 DIAGNOSIS — Z87898 Personal history of other specified conditions: Secondary | ICD-10-CM

## 2024-04-14 DIAGNOSIS — E782 Mixed hyperlipidemia: Secondary | ICD-10-CM | POA: Diagnosis not present

## 2024-04-14 DIAGNOSIS — G909 Disorder of the autonomic nervous system, unspecified: Secondary | ICD-10-CM

## 2024-04-14 DIAGNOSIS — R0789 Other chest pain: Secondary | ICD-10-CM

## 2024-04-14 MED ORDER — ISOSORBIDE MONONITRATE ER 60 MG PO TB24: ORAL_TABLET | ORAL | 6 refills | Status: AC

## 2024-04-14 NOTE — Patient Instructions (Signed)
 Medication Instructions:   Increase Imdur  to 90mg  every morning & continue the 60mg  every evening  Continue all other medications.     Labwork:  none  Testing/Procedures:  none  Follow-Up:  6 months   Any Other Special Instructions Will Be Listed Below (If Applicable).   If you need a refill on your cardiac medications before your next appointment, please call your pharmacy.

## 2024-04-14 NOTE — Progress Notes (Signed)
 "     Clinical Summary [name] is a 69 y.o.female seen today for follow up of the following medical problems.    1. 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 Issues getting arranged with transporation.     - we changed florinef  to 0.1mg  every other day due to high bp's  - no recent dizziness or syncope    2. Chest pain - several year history of chest pain - normal cath in 2013, normal stress test 2013 - admit to [facility] 07/2014 with chest pain. Negative workup for ACS. She was discharged with an out patient Lexiscan  which showed no evidence of ischemia.   -  echo 11/2014 LVEF 55-60%, no WMAs.  - 09/2017 nuclear stress no ischemia.  - started on imdur  by pcp with improved symptoms.      12/2018 admission with chest pain - no objective evidence of ischemia by EKG or enzymes this admission - echo LVE 60-65%, grade I diastolic dysfunction  -  symptoms were noncardiac in description. Lasting constantly x 6 days, worst with deep breathing.    - chronic chest pains, some increase frequency - better with prn NG - she asks to possibly increase imdur  to help with symptoms   3. Hyperlipidemia - 11/2022 TC 127 TG 96 HDL 45 LDL 64 - 11/2023 TC 850 TG 99 HDL 60 LDL 71 - she is on crestor     Takes cruises to [place] on the regular.  Past Medical History:  Diagnosis Date   Allergy    Anemia    Anginal pain    last time    Anxiety    Arthritis    RHEUMATOID   Asthma    Bipolar 1 disorder (HCC)    Blood transfusion without reported diagnosis    x 3   Cataracts, bilateral 07/2017   CHF (congestive heart failure) (HCC)    COPD (chronic obstructive pulmonary disease) (HCC)    Coronary artery  disease    reported hx of MI;  Echo 2009 with normal LVF;  Myoview  05/2011: no ischemia   Depression    Diabetes mellitus without complication (HCC)    Dyslipidemia    Dysrhythmia    SVT   Elevated liver enzymes 06/30/2022   Esophageal stricture    Fibromyalgia    GERD (gastroesophageal reflux disease)    H/O hiatal hernia    Head injury, unspecified    Headache    migraines   Herpes simplex infection    History of kidney stones    History of loop recorder 08/10/2017   Managed by Dr. Cathlyn Waddell   Hyperlipidemia    Hypertension    Insomnia    Myocardial infarction [facility])    age 49   Osteoporosis    Pneumonia    hx   Seizures (HCC)    none in the last 3-4 years as of 11/03/22 per patient   Shortness of breath    Sleep apnea    mild, does not require positive pressure treatment. (02/03/22)   Spinal stenosis of lumbar region    Spondylolisthesis    Status post placement of implantable loop recorder    Supraventricular tachycardia    Syncope and collapse    s/p ILR; no arhythmogenic  cause identified   UTI (lower urinary tract infection)      Allergies[1]   Current Outpatient Medications  Medication Sig Dispense Refill   Accu-Chek Softclix Lancets lancets test blood sugar up to 4 times a day as directed. dx r73.03 400 each 3   acyclovir  ointment (ZOVIRAX ) 5 % apply 1 application topically every 3 (three) hours as needed (outbreaks). 30 g 1   albuterol  (VENTOLIN  HFA) 108 (90 Base) MCG/ACT inhaler inhale 2 puffs every 6 hours as needed for shortness of breath and wheezing. 18 g 0   Alcohol  Swabs  (B-D SINGLE USE SWABS  REGULAR) PADS use 1 pad daily when checking blood sugar. r73.03 100 each 3   azithromycin  (ZITHROMAX  Z-PAK) 250 MG tablet As directed 6 tablet 0   benzonatate  (TESSALON ) 100 MG capsule Take 1 capsule (100 mg total) by mouth 2 (two) times daily as needed for cough. 20 capsule 0   blood glucose meter kit and supplies Dispense based on patient and insurance  preference. Use up to four times daily as directed. (FOR ICD-10 E10.9, E11.9). 1 each 0   busPIRone  (BUSPAR ) 10 MG tablet Take 1 tablet (10 mg total) by mouth 2 (two) times daily as needed. 60 tablet 5   butalbital -acetaminophen -caffeine  (FIORICET ) 50-325-40 MG tablet take 1 tablet by mouth every 6 (six) hours as needed for headache. 20 tablet 0   carvedilol  (COREG ) 3.125 MG tablet TAKE 1/2 (0.5) TABLET TWICE A DAY WITH A MEAL; BLOOD PRESSURE OVER 150 BEFORE TAKING. 90 tablet 1   Continuous Glucose Sensor (FREESTYLE LIBRE 3 PLUS SENSOR) MISC Apply 1 each topically every 14 (fourteen) days. Change sensor every 15 days. 2 each 5   dexlansoprazole  (DEXILANT ) 60 MG capsule Take 1 capsule (60 mg total) by mouth daily. 90 capsule 1   diclofenac  Sodium (VOLTAREN ) 1 % GEL APPLY 4 GRAMS TOPICALLY 4 TIMES A DAY. (Patient taking differently: Apply 4 g topically 4 (four) times daily as needed (pain).) 100 g 0   DULoxetine  (CYMBALTA ) 60 MG capsule Take 1 capsule (60 mg total) by mouth at bedtime. 90 capsule 1   empagliflozin  (JARDIANCE ) 25 MG TABS tablet Take 1 tablet (25 mg total) by mouth daily. 90 tablet 1   escitalopram  (LEXAPRO ) 20 MG tablet Take 1 tablet (20 mg total) by mouth daily. 90 tablet 1   estradiol  (ESTRACE ) 0.1 MG/GM vaginal cream Place 1 Applicatorful vaginally 3 (three) times a week. 42.5 g 3   FIBER PO Take 1 tablet by mouth in the morning and at bedtime.     fludrocortisone  (FLORINEF ) 0.1 MG tablet take 1 tablet (100mcg total) by mouth every other day. 15 tablet 0   fluticasone  (FLONASE ) 50 MCG/ACT nasal spray USE 1 SPRAY IN EACH NOSTRIL ONCE DAILY. (Patient taking differently: Place 1 spray into both nostrils daily as needed for allergies.) 16 g 5   fluticasone  furoate-vilanterol (BREO ELLIPTA ) 100-25 MCG/ACT AEPB Inhale 1 puff by mouth once daily 60 each 1   furosemide  (LASIX ) 40 MG tablet TAKE 1 TABLET DAILY AS NEEDED FOR EXCESSIVE FLUID. 90 tablet 1   Glucagon , rDNA, (GLUCAGON  EMERGENCY) 1  MG KIT Inject 1 mg into the skin as needed. 5 kit 10   glucose blood (ACCU-CHEK GUIDE) test strip TEST BLOOD SUGAR UP TO 4 TIMES A DAY AS DIRECTED. R73.03 400 each 3   hydrALAZINE  (APRESOLINE ) 25 MG tablet take 1 tablet by mouth 3 times daily as needed (for severe hypertension/systolic number 170 or greater). 270 tablet 1   HYDROcodone -acetaminophen  (  NORCO) 7.5-325 MG tablet Take 1 tablet by mouth 3 (three) times daily. 90 tablet 0   [START ON 04/15/2024] HYDROcodone -acetaminophen  (NORCO) 7.5-325 MG tablet Take 1 tablet by mouth 3 (three) times daily. 90 tablet 0   [START ON 05/15/2024] HYDROcodone -acetaminophen  (NORCO) 7.5-325 MG tablet Take 1 tablet by mouth 3 (three) times daily. 90 tablet 0   ipratropium (ATROVENT ) 0.02 % nebulizer solution USE 1 VIAL ( ) IN NEBULIZER EVERY 6 HOURS AS NEEDED FOR WHEEZING OR SHORTNESS OF BREATH. 150 mL 2   isosorbide  mononitrate (IMDUR ) 60 MG 24 hr tablet Take 1 tablet (60 mg total) by mouth 2 (two) times daily. 60 tablet 6   lamoTRIgine  (LAMICTAL ) 150 MG tablet Take 1 tablet (150 mg total) by mouth daily. 90 tablet 1   levETIRAcetam  (KEPPRA ) 500 MG tablet Take 1 tablet (500 mg total) by mouth 2 (two) times daily. 180 tablet 1   levocetirizine (XYZAL ) 5 MG tablet take 1 tablet by mouth every morning. 30 tablet 1   lidocaine  (LIDODERM ) 5 % place 1 patch onto the skin daily. remove & discard patch within 12 hours or as directed by md 10 patch 1   lidocaine  (XYLOCAINE ) 5 % ointment apply to affected area 3 times a day as needed for mild or moderate pain. 50 g 0   methocarbamol  (ROBAXIN ) 500 MG tablet take 1 tablet (500 milligram total) by mouth 4 (four) times daily. 30 tablet 0   nitroGLYCERIN  (NITROSTAT ) 0.4 MG SL tablet place one (1) tablet under tongue every 5 minutes up to (3) doses as needed for chest pain. 25 tablet 0   ondansetron  (ZOFRAN ) 4 MG tablet take 1 tablet by mouth every 8 hours as needed for nausea and vomiting. 20 tablet 0   polyethylene glycol  (MIRALAX  / GLYCOLAX ) 17 g packet Take 17 g by mouth 2 (two) times daily. 14 each 0   promethazine -dextromethorphan (PROMETHAZINE -DM) 6.25-15 MG/5ML syrup Take 5 mLs by mouth 4 (four) times daily as needed for cough. 118 mL 0   rosuvastatin  (CRESTOR ) 10 MG tablet Take 1 tablet (10 mg total) by mouth daily. 90 tablet 1   valACYclovir  (VALTREX ) 1000 MG tablet Take 1 tablet (1,000 mg total) by mouth 3 (three) times daily. When has outbreak 21 tablet 2   valACYclovir  (VALTREX ) 500 MG tablet TAKE (1) TABLET TWICE DAILY. 180 tablet 1   No current facility-administered medications for this visit.     Past Surgical History:  Procedure Laterality Date   BACK SURGERY     BREAST EXCISIONAL BIOPSY Right    1990s   BREAST SURGERY     lumpectomy   CARDIAC CATHETERIZATION  10/06/2011   CATARACT EXTRACTION W/PHACO Right 07/31/2017   Procedure: CATARACT EXTRACTION PHACO AND INTRAOCULAR LENS PLACEMENT (IOC);  Surgeon: Harrie Agent, MD;  Location: [facility];  Service: Ophthalmology;  Laterality: Right;  CDE: 2.33   CATARACT EXTRACTION W/PHACO Left 08/14/2017   Procedure: CATARACT EXTRACTION PHACO AND INTRAOCULAR LENS PLACEMENT (IOC);  Surgeon: Harrie Agent, MD;  Location: [facility];  Service: Ophthalmology;  Laterality: Left;  CDE: 2.74   CHOLECYSTECTOMY     CYSTOSCOPY     stone   DIAGNOSTIC LAPAROSCOPY     laparoscopic cholecystectomy   DOPPLER ECHOCARDIOGRAPHY  2009   ESOPHAGOGASTRODUODENOSCOPY N/A 10/31/2020   Procedure: ESOPHAGOGASTRODUODENOSCOPY (EGD);  Surgeon: Tanda Locus, MD;  Location: [facility];  Service: General;  Laterality: N/A;   EYE SURGERY Bilateral 08/14/2017   cataract removal   FINGER ARTHROPLASTY Left 11/27/2023   Procedure:  ARTHROPLASTY, FINGER;  Surgeon: Arlinda Buster, MD;  Location: [facility];  Service: Orthopedics;  Laterality: Left;  LEFT THUMB CARPOMATACARPAL JOINT ARTHROPLASTY WITH INTERNAL BRACE AND METACARPOPHALANGEAL JOINT CAPSULODESIS PINNING   GLUTEUS  MINIMUS REPAIR Left 11/04/2022   Procedure: LEFT GLUTEUS MEDIUS REPAIR;  Surgeon: Genelle Standing, MD;  Location: [facility];  Service: Orthopedics;  Laterality: Left;   head up tilt table testing  06/15/2007   Danelle Birmingham   HEMORRHOID SURGERY     HERNIA REPAIR     insertion of implatable loop recorder  08/11/2007   Danelle Birmingham   POSTERIOR CERVICAL FUSION/FORAMINOTOMY N/A 12/19/2013   Procedure: RIGHT C3-4.C4-5 AND C5-6 FORAMINOTOMIES;  Surgeon: Lynwood FORBES Better, MD;  Location: [facility] OR;  Service: Orthopedics;  Laterality: N/A;   TOTAL HIP ARTHROPLASTY Left 01/31/2020   Procedure: LEFT TOTAL HIP ARTHROPLASTY ANTERIOR APPROACH;  Surgeon: Vernetta Lonni GRADE, MD;  Location: [facility];  Service: Orthopedics;  Laterality: Left;   TOTAL SHOULDER ARTHROPLASTY Right 11/15/2019   Procedure: RIGHT TOTAL SHOULDER ARTHROPLASTY;  Surgeon: Addie Cordella Hamilton, MD;  Location: [facility];  Service: Orthopedics;  Laterality: Right;   TOTAL SHOULDER REVISION Right 06/11/2021   Procedure: RIGHT SHOULDER CONVERSION TOTAL SHOULDER ARTHROPLASTY to REVERSE TOTAL SHOULDER ARTHROPLASTY;  Surgeon: Addie Cordella Hamilton, MD;  Location: [facility] OR;  Service: Orthopedics;  Laterality: Right;   TUBAL LIGATION     UPPER GASTROINTESTINAL ENDOSCOPY     XI ROBOTIC ASSISTED HIATAL HERNIA REPAIR N/A 10/31/2020   Procedure: XI ROBOTIC ASSISTED HIATAL HERNIA REPAIR WITH PARTIAL FUNDOPLICATION;  Surgeon: Tanda Locus, MD;  Location: [facility];  Service: General;  Laterality: N/A;     Allergies[2]    Family History  Problem Relation Age of Onset   Heart attack Father    Mental illness Father    Mental illness Mother    Heart attack Brother        stents   Alcohol  abuse Brother    Heart disease Brother    Drug abuse Brother    Diabetes Brother    Colon cancer Maternal Aunt    Cirrhosis Brother    Stomach cancer Neg Hx    Esophageal cancer Neg Hx    Rectal cancer Neg Hx      Social History Ms. Fede reports that she has never smoked.  She has never used smokeless tobacco. Ms. Pew reports no history of alcohol  use.    Physical Examination Today's Vitals   04/14/24 1259  BP: 120/86  Pulse: 88  SpO2: 94%  Weight: 133 lb (60.3 kg)  Height: 5' 2 (1.575 m)   Body mass index is 24.33 kg/m.  Gen: resting comfortably, no acute distress HEENT: no scleral icterus, pupils equal round and reactive, no palptable cervical adenopathy,  CV: RRR, no m/rg, no jvd Resp: Clear to auscultation bilaterally GI: abdomen is soft, non-tender, non-distended, normal bowel sounds, no hepatosplenomegaly MSK: extremities are warm, no edema.  Skin: warm, no rash Neuro:  no focal deficits Psych: appropriate affect   Diagnostic Studies  05/2011 Lexiscan  MPI No ischemia     Jan 2009 Echo LEFT VENTRICLE: - Left ventricular size was normal. - Overall left ventricular systolic function was normal. - There were no left ventricular regional wall motion abnormalities. - Left ventricular wall thickness was normal.  AORTIC VALVE: - The aortic valve was trileaflet. - Aortic valve thickness was normal.  AORTA: - The aortic root was normal in size. - The aortic arch was normal.  MITRAL VALVE: -  Mitral valve structure was normal.  Doppler interpretation(s): - There was trivial mitral valvular regurgitation.  LEFT ATRIUM: - Left atrial size was normal.  PULMONARY VEINS: - The pulmonary veins were grossly normal.  RIGHT VENTRICLE: - Right ventricular size was normal. - Right ventricular systolic function was normal. - Right ventricular wall thickness was normal.  PULMONIC VALVE: - The structure of the pulmonic valve appeared to be normal.  TRICUSPID VALVE: - The tricuspid valve structure was normal.  Doppler interpretation(s): - There was no significant tricuspid valvular regurgitation.  PULMONARY ARTERY: - The pulmonary artery was normal size.  RIGHT ATRIUM: - Right atrial size was normal.  SYSTEMIC VEINS: -  The inferior vena cava was normal.  PERICARDIUM: - There was no pericardial effusion.  ---------------------------------------------------------------  SUMMARY - Overall left ventricular systolic function was normal. There were no left ventricular regional wall motion abnormalities. - The pulmonary veins were grossly normal.     09/2011 Cath Hemodynamic Findings:   Central aortic pressure: 108/58   Left ventricular pressure: 107/10/14   Angiographic Findings:   Left main: No obstructive disease noted.   Left Anterior Descending Artery: Moderate to large sized vessel that courses to the apex. There is a moderate sized diagonal Revere Maahs. No obstructive disease noted.   Circumflex Artery: Large, dominant artery with moderate sized first obtuse marginal Giavonni Cizek and left sided posterolateral Ragan Reale with no disease noted.   Right Coronary Artery: Small, non-dominant vessel with no disease noted.   Left Ventricular Angiogram:LVEF=65%.   Impression:   1. No angiographic evidence of CAD   2. Normal LV systolic function   3. Non-cardiac chest pain   Recommendations: No further ischemic workup.   Complications: None. The patient tolerated the procedure well.     11/2014 echo Study Conclusions  - Left ventricle: The cavity size was normal. Systolic function was   normal. The estimated ejection fraction was in the range of 55%   to 60%. Wall motion was normal; there were no regional wall   motion abnormalities. There was an increased relative   contribution of atrial contraction to ventricular filling.   Doppler parameters are consistent with abnormal left ventricular   relaxation (grade 1 diastolic dysfunction). - Mitral valve: There was trivial regurgitation. - Tricuspid valve: There was trivial regurgitation. - Pulmonic valve: There was trivial regurgitation.      09/2017 nuclear stress There was no ST segment deviation noted during stress. Nonspecific T wave flattening in aVL and V2  seen throughout study. Defect 1: There is a medium defect of mild severity present in the mid inferior, apical inferior and apical lateral location. This appears to be due to soft tissue attenuation given normal regional wall motion. No ischemic territories. This is a low risk study. Nuclear stress EF: 65%.   Assessment and Plan   1. Syncope/Autonomic dysfunction/HTN - difficult balance between severe symptomatic both hypotension and HTN.  -unorthodox regimen but through trial and error has done fairly well for her all things considred and we have continued -no recent symptoms, continue curren tmeds     2. Chest pain - long history of symptoms with multiple negative stress tests, most recently 09/2017  Lexiscan  was negative. Cath 2013 with patent vessels. Symptoms did improve with imdur  -chronic symptoms somewhate more frequent, will increase imdur  to 90mg  in AM and 60mg  in PM   3. Hyperlipidemia -lipids at goal, continue current meds    Dorn PHEBE Ross, M.D.     [1]  Allergies Allergen Reactions  Codeine  Other (See Comments)    I will have a heart attack.   Morphine And Codeine  Other (See Comments)    It will cause me to have a heart attack.   Ambien  [Zolpidem  Tartrate] Nausea And Vomiting   Clonidine  And Derivatives Nausea And Vomiting    GERD - caused acid reflux per pt   Metformin  And Related Nausea And Vomiting    Cramping from metformin    Lyrica  [Pregabalin ] Swelling and Other (See Comments)    Weight gain   Neurontin [Gabapentin] Other (See Comments)    Causes elevated LFTs   [2]  Allergies Allergen Reactions   Codeine  Other (See Comments)    I will have a heart attack.   Morphine And Codeine  Other (See Comments)    It will cause me to have a heart attack.   Ambien  [Zolpidem  Tartrate] Nausea And Vomiting   Clonidine  And Derivatives Nausea And Vomiting    GERD - caused acid reflux per pt   Metformin  And Related Nausea And Vomiting    Cramping  from metformin    Lyrica  [Pregabalin ] Swelling and Other (See Comments)    Weight gain   Neurontin [Gabapentin] Other (See Comments)    Causes elevated LFTs    "

## 2024-04-28 ENCOUNTER — Ambulatory Visit: Payer: Self-pay

## 2024-04-28 ENCOUNTER — Ambulatory Visit: Admitting: Family Medicine

## 2024-04-28 VITALS — BP 105/69 | HR 90 | Temp 98.9°F | Ht 62.0 in | Wt 132.0 lb

## 2024-04-28 DIAGNOSIS — R6889 Other general symptoms and signs: Secondary | ICD-10-CM

## 2024-04-28 DIAGNOSIS — J40 Bronchitis, not specified as acute or chronic: Secondary | ICD-10-CM

## 2024-04-28 LAB — VERITOR SARS-COV-2 AND FLU A+B
BD Veritor SARS-CoV-2 Ag: NEGATIVE
Influenza A: NEGATIVE
Influenza B: NEGATIVE

## 2024-04-28 LAB — RAPID STREP SCREEN (MED CTR MEBANE ONLY): Strep Gp A Ag, IA W/Reflex: NEGATIVE

## 2024-04-28 LAB — CULTURE, GROUP A STREP

## 2024-04-28 MED ORDER — HYDROCODONE BIT-HOMATROP MBR 5-1.5 MG/5ML PO SOLN: 5.0000 mL | Freq: Four times a day (QID) | ORAL | 0 refills | Status: AC | PRN

## 2024-04-28 MED ORDER — PREDNISONE 10 MG PO TABS: ORAL_TABLET | ORAL | 0 refills | Status: AC

## 2024-04-28 MED ORDER — MOXIFLOXACIN HCL 400 MG PO TABS: 400.0000 mg | ORAL_TABLET | Freq: Every day | ORAL | 0 refills | Status: AC

## 2024-04-28 NOTE — Progress Notes (Signed)
 "  Subjective:  Patient ID: [name], female    DOB: Jul 30, 1955  Age: 69 y.o. MRN: [id number]  CC: Cough (Cough and wheezing for 2 days. Green mucous. Wheezing and cough worse at night. Fever this morning and sore throat. Taking Nyquil and inhaler. Wanted to be seen because she got over pneumonia not too long ago. Some SOB and chest pain when breathing. )   HPI  Discussed the use of AI scribe software for clinical note transcription with the patient, who gave verbal consent to proceed.  History of Present Illness [name] is a 69 year old female who presents with a cough and sore throat.  She has experienced a cough for two days, which was initially dry but has now become productive with green sputum. A sore throat developed this morning.  She has had a fever with a temperature reaching 102.60F, which she managed to reduce with Tylenol . She reports a lack of appetite and has not been eating much.  She mentions body aches, which she attributes to a fall on the ice yesterday. During the fall, she hit the back of her head but did not lose consciousness. She also reports feeling short of breath and very tired.  She recently recovered from pneumonia and is concerned about her current symptoms. She has been prescribed benzonatate  in the past for her cough, but it was ineffective.  In the review of symptoms, she denies ear pain but reports shortness of breath.          04/28/2024   12:47 PM 04/05/2024   11:33 AM 03/15/2024    2:16 PM  Depression screen PHQ 2/9  Decreased Interest 0 0 0  Down, Depressed, Hopeless 0 0 0  PHQ - 2 Score 0 0 0  Altered sleeping 0  0  Tired, decreased energy 0  0  Change in appetite 0  0  Feeling bad or failure about yourself  0  0  Trouble concentrating 0  0  Moving slowly or fidgety/restless 0  0  Suicidal thoughts 0  0  PHQ-9 Score 0  0  Difficult doing work/chores Not difficult at all  Not difficult at all     History [name] has a past medical history of Allergy, Anemia, Anginal pain, Anxiety, Arthritis, Asthma, Bipolar 1 disorder (HCC), Blood transfusion without reported diagnosis, Cataracts, bilateral (07/2017), CHF (congestive heart failure) (HCC), COPD (chronic obstructive pulmonary disease) (HCC), Coronary artery disease, Depression, Diabetes mellitus without complication (HCC), Dyslipidemia, Dysrhythmia, Elevated liver enzymes (06/30/2022), Esophageal stricture, Fibromyalgia, GERD (gastroesophageal reflux disease), H/O hiatal hernia, Head injury, unspecified, Headache, Herpes simplex infection, History of kidney stones, History of loop recorder (08/10/2017), Hyperlipidemia, Hypertension, Insomnia, Myocardial infarction (HCC), Osteoporosis, Pneumonia, Seizures (HCC), Shortness of breath, Sleep apnea, Spinal stenosis of lumbar region, Spondylolisthesis, Status post placement of implantable loop recorder, Supraventricular tachycardia, Syncope and collapse, and UTI (lower urinary tract infection).   She has a past surgical history that includes insertion of implatable loop recorder (08/11/2007); head up tilt table testing (06/15/2007); Back surgery; doppler echocardiography (2009); Tubal ligation; Cholecystectomy; Cystoscopy; Hemorrhoid surgery; Breast surgery; Posterior cervical fusion/foraminotomy (N/A, 12/19/2013); Cataract extraction w/PHACO (Right, 07/31/2017); Cataract extraction w/PHACO (Left, 08/14/2017); Diagnostic laparoscopy; Cardiac catheterization (10/06/2011); Total shoulder arthroplasty (Right, 11/15/2019); Total hip arthroplasty (Left, 01/31/2020); Upper gastrointestinal endoscopy; Xi robotic assisted hiatal hernia repair (N/A, 10/31/2020); Esophagogastroduodenoscopy (N/A, 10/31/2020); Eye surgery (Bilateral, 08/14/2017); Hernia repair; Total shoulder revision (Right, 06/11/2021); Breast excisional biopsy (Right); Gluteus minimus repair (Left, 11/04/2022); and Finger  arthroplasty (Left,  11/27/2023).   Her family history includes Alcohol  abuse in her brother; Cirrhosis in her brother; Colon cancer in her maternal aunt; Diabetes in her brother; Drug abuse in her brother; Heart attack in her brother and father; Heart disease in her brother; Mental illness in her father and mother.She reports that she has never smoked. She has never used smokeless tobacco. She reports that she does not drink alcohol  and does not use drugs.    ROS Review of Systems  Constitutional:  Negative for activity change, appetite change, chills and fever.  HENT:  Positive for congestion, postnasal drip, rhinorrhea and sinus pressure. Negative for ear discharge, ear pain, hearing loss, nosebleeds, sneezing and trouble swallowing.   Respiratory:  Negative for chest tightness and shortness of breath.   Cardiovascular:  Negative for chest pain and palpitations.  Skin:  Negative for rash.    Objective:  BP 105/69   Pulse 90   Temp 98.9 F (37.2 C)   Ht 5' 2 (1.575 m)   Wt 132 lb (59.9 kg)   SpO2 92%   BMI 24.14 kg/m   BP Readings from Last 3 Encounters:  04/28/24 105/69  04/14/24 120/86  04/05/24 125/83    Wt Readings from Last 3 Encounters:  04/28/24 132 lb (59.9 kg)  04/14/24 133 lb (60.3 kg)  04/05/24 136 lb (61.7 kg)     Physical Exam Physical Exam GENERAL: Alert, cooperative, well developed, no acute distress. HEENT: Normocephalic, throat with drainage and swelling on the right, more on the left, moist mucous membranes, ears normal. CHEST: Wheezing present. CARDIOVASCULAR: Normal heart rate and rhythm, S1 and S2 normal without murmurs. ABDOMEN: Soft, non-tender, non-distended, without organomegaly, normal bowel sounds. EXTREMITIES: No cyanosis or edema. NEUROLOGICAL: Cranial nerves grossly intact, moves all extremities without gross motor or sensory deficit.   Assessment & Plan:  Flu-like symptoms -     Veritor SARS-CoV-2 and Flu A+B -     Rapid Strep Screen ([facility]  ONLY)  Bronchitis  Other orders -     HYDROcodone  Bit-Homatrop MBr; Take 5 mLs by mouth every 6 (six) hours as needed for up to 5 days for cough.  Dispense: 100 mL; Refill: 0 -     Moxifloxacin  HCl; Take 1 tablet (400 mg total) by mouth daily.  Dispense: 10 tablet; Refill: 0 -     predniSONE ; Take 5 daily for 2 days followed by 4,3,2 and 1 for 2 days each.  Dispense: 30 tablet; Refill: 0 -     Culture, Group A Strep    Assessment and Plan Assessment & Plan Acute bronchitis   She has a cough for two days, initially dry and now productive with green sputum, accompanied by a sore throat and fever of 102.88F. Wheezing and shortness of breath are present. She recently recovered from pneumonia. Although flu and COVID tests were negative, treatment will proceed presumptively with antibiotics and supportive care. Start Avelox  for antibiotic coverage and Hycodan for cough management. Initiate a prednisone  taper: 5 mg daily for 2 days, then 4 mg daily for 2 days, 3 mg daily for 2 days, 2 mg daily for 2 days, and 1 mg daily for 2 days.  Recent fall   She fell on ice, impacting her head without losing consciousness. She reports body aches likely related to the fall. Monitor for worsening symptoms or signs of concussion.       Follow-up: Return if symptoms worsen or fail to improve.  Butler Der,  M.D. "

## 2024-04-28 NOTE — Telephone Encounter (Signed)
 FYI Only or Action Required?: Action required by provider: request for appointment.  Patient was last seen in primary care on 04/05/2024 by Erica Mustard, FNP.  Called Nurse Triage reporting Cough.  Symptoms began several days ago.  Interventions attempted: Nothing.  Symptoms are: gradually worsening.Cough, wheezing, fever 102.9.  Triage Disposition: See HCP Within 4 Hours (Or PCP Triage)  Patient/caregiver understands and will follow disposition?: Yes       Summary: wheezing , coughing ,real bad sore throat  this morning fever 102.9   Reason for Triage: wheezing , coughing ,real bad sore throat this morning fever 102.9       Reason for Disposition  [1] MILD difficulty breathing (e.g., minimal/no SOB at rest, SOB with walking, pulse < 100) AND [2] still present when not coughing  Answer Assessment - Initial Assessment Questions 1. ONSET: When did the cough begin?      2 days ago 2. SEVERITY: How bad is the cough today?      severe 3. SPUTUM: Describe the color of your sputum (e.g., none, dry cough; clear, white, yellow, green)     no 4. HEMOPTYSIS: Are you coughing up any blood? If Yes, ask: How much? (e.g., flecks, streaks, tablespoons, etc.)     no 5. DIFFICULTY BREATHING: Are you having difficulty breathing? If Yes, ask: How bad is it? (e.g., mild, moderate, severe)      mild 6. FEVER: Do you have a fever? If Yes, ask: What is your temperature, how was it measured, and when did it start?     102.9 7. CARDIAC HISTORY: Do you have any history of heart disease? (e.g., heart attack, congestive heart failure)      no 8. LUNG HISTORY: Do you have any history of lung disease?  (e.g., pulmonary embolus, asthma, emphysema)     bronchitis 9. PE RISK FACTORS: Do you have a history of blood clots? (or: recent major surgery, recent prolonged travel, bedridden)     no 10. OTHER SYMPTOMS: Do you have any other symptoms? (e.g., runny nose, wheezing,  chest pain)       Wheezing, sore throat 11. PREGNANCY: Is there any chance you are pregnant? When was your last menstrual period?       no 12. TRAVEL: Have you traveled out of the country in the last month? (e.g., travel history, exposures)       no  Protocols used: Cough - Acute Non-Productive-A-AH

## 2024-04-28 NOTE — Telephone Encounter (Signed)
Noted  -LS

## 2024-04-30 NOTE — ED Notes (Signed)
 Pt 02 sats 93% on RA. Pt currently being treated for bronchitis, taking antibiotics and steroids.

## 2024-05-02 ENCOUNTER — Encounter: Payer: Self-pay | Admitting: Family Medicine

## 2024-05-02 ENCOUNTER — Ambulatory Visit: Payer: Self-pay | Admitting: Family Medicine

## 2024-05-04 ENCOUNTER — Other Ambulatory Visit: Payer: Self-pay | Admitting: Family Medicine

## 2024-05-04 MED ORDER — PROMETHAZINE-DM 6.25-15 MG/5ML PO SYRP: 5.0000 mL | ORAL_SOLUTION | Freq: Four times a day (QID) | ORAL | 0 refills | Status: AC | PRN

## 2024-05-06 ENCOUNTER — Other Ambulatory Visit (HOSPITAL_COMMUNITY): Payer: Self-pay

## 2024-05-06 ENCOUNTER — Telehealth: Payer: Self-pay

## 2024-05-06 DIAGNOSIS — R7303 Prediabetes: Secondary | ICD-10-CM

## 2024-05-06 NOTE — Telephone Encounter (Signed)
 Copied from CRM [id number]. Topic: Clinical - Order For Equipment >> May 06, 2024  1:36 PM Ahlexyia S wrote: Reason for CRM: Pt called in stating that she is currently out of Continuous Glucose Sensor (FREESTYLE LIBRE 3 PLUS SENSOR) MISC and is needing a refill on this. Pt is not sure who the supplier normally is she just receives a box on her front porch with items.

## 2024-05-06 NOTE — Addendum Note (Signed)
 Addended by: MICHELINE ROSINA FALCON on: 05/06/2024 02:36 PM   Modules accepted: Orders

## 2024-05-06 NOTE — Telephone Encounter (Signed)
 FreeStyle Libre 3 Plus Sensor Note (10/28/2023): Sent to [facility] via [employer] [facility]  not sure how to send this in can you help?

## 2024-05-12 ENCOUNTER — Ambulatory Visit: Admitting: Orthopedic Surgery

## 2024-05-23 ENCOUNTER — Inpatient Hospital Stay: Admitting: Nurse Practitioner

## 2024-06-02 ENCOUNTER — Ambulatory Visit: Admitting: Nurse Practitioner

## 2024-10-11 ENCOUNTER — Ambulatory Visit: Payer: Self-pay
